# Patient Record
Sex: Female | Born: 1935
Health system: Southern US, Community
[De-identification: ages and names within clinical notes are randomized; demographics above are authoritative.]

## PROBLEM LIST (undated history)

## (undated) DIAGNOSIS — C50919 Malignant neoplasm of unspecified site of unspecified female breast: Secondary | ICD-10-CM

## (undated) DIAGNOSIS — C3490 Malignant neoplasm of unspecified part of unspecified bronchus or lung: Secondary | ICD-10-CM

## (undated) DIAGNOSIS — C50912 Malignant neoplasm of unspecified site of left female breast: Secondary | ICD-10-CM

## (undated) DIAGNOSIS — E039 Hypothyroidism, unspecified: Secondary | ICD-10-CM

## (undated) DIAGNOSIS — I350 Nonrheumatic aortic (valve) stenosis: Secondary | ICD-10-CM

## (undated) DIAGNOSIS — D709 Neutropenia, unspecified: Secondary | ICD-10-CM

## (undated) DIAGNOSIS — I480 Paroxysmal atrial fibrillation: Secondary | ICD-10-CM

## (undated) DIAGNOSIS — I714 Abdominal aortic aneurysm, without rupture, unspecified: Secondary | ICD-10-CM

## (undated) DIAGNOSIS — Z923 Personal history of irradiation: Secondary | ICD-10-CM

## (undated) DIAGNOSIS — I499 Cardiac arrhythmia, unspecified: Secondary | ICD-10-CM

## (undated) DIAGNOSIS — I272 Pulmonary hypertension, unspecified: Secondary | ICD-10-CM

## (undated) DIAGNOSIS — I10 Essential (primary) hypertension: Secondary | ICD-10-CM

## (undated) DIAGNOSIS — I340 Nonrheumatic mitral (valve) insufficiency: Secondary | ICD-10-CM

## (undated) DIAGNOSIS — C3492 Malignant neoplasm of unspecified part of left bronchus or lung: Secondary | ICD-10-CM

## (undated) DIAGNOSIS — I219 Acute myocardial infarction, unspecified: Secondary | ICD-10-CM

## (undated) DIAGNOSIS — M1990 Unspecified osteoarthritis, unspecified site: Secondary | ICD-10-CM

## (undated) DIAGNOSIS — J45909 Unspecified asthma, uncomplicated: Secondary | ICD-10-CM

## (undated) HISTORY — DX: Nonrheumatic mitral (valve) insufficiency: I34.0

## (undated) HISTORY — PX: BREAST BIOPSY: SHX20

## (undated) HISTORY — DX: Hypothyroidism, unspecified: E03.9

## (undated) HISTORY — DX: Malignant neoplasm of unspecified part of unspecified bronchus or lung: C34.90

## (undated) HISTORY — DX: Neutropenia, unspecified: D70.9

## (undated) HISTORY — DX: Essential (primary) hypertension: I10

## (undated) HISTORY — DX: Malignant neoplasm of unspecified site of unspecified female breast: C50.919

## (undated) HISTORY — DX: Nonrheumatic aortic (valve) stenosis: I35.0

## (undated) HISTORY — DX: Malignant neoplasm of unspecified part of left bronchus or lung: C34.92

## (undated) HISTORY — DX: Abdominal aortic aneurysm, without rupture, unspecified: I71.40

## (undated) HISTORY — PX: LUNG REMOVAL, PARTIAL: SHX233

## (undated) HISTORY — PX: FOOT SURGERY: SHX648

## (undated) HISTORY — DX: Malignant neoplasm of unspecified site of left female breast: C50.912

## (undated) HISTORY — DX: Pulmonary hypertension, unspecified: I27.20

## (undated) HISTORY — PX: BREAST LUMPECTOMY: SHX2

---

## 1997-11-02 DIAGNOSIS — C50912 Malignant neoplasm of unspecified site of left female breast: Secondary | ICD-10-CM

## 1997-11-02 HISTORY — DX: Malignant neoplasm of unspecified site of left female breast: C50.912

## 1998-08-05 ENCOUNTER — Encounter: Admission: RE | Admit: 1998-08-05 | Discharge: 1998-11-03 | Payer: Self-pay | Admitting: *Deleted

## 2001-06-20 ENCOUNTER — Ambulatory Visit (HOSPITAL_COMMUNITY): Admission: RE | Admit: 2001-06-20 | Discharge: 2001-06-20 | Payer: Self-pay | Admitting: Oncology

## 2001-06-20 ENCOUNTER — Encounter (HOSPITAL_COMMUNITY): Payer: Self-pay | Admitting: Oncology

## 2001-10-04 ENCOUNTER — Other Ambulatory Visit: Admission: RE | Admit: 2001-10-04 | Discharge: 2001-10-04 | Payer: Self-pay | Admitting: Obstetrics and Gynecology

## 2002-01-18 ENCOUNTER — Encounter (HOSPITAL_COMMUNITY): Admission: RE | Admit: 2002-01-18 | Discharge: 2002-02-17 | Payer: Self-pay | Admitting: Oncology

## 2002-01-18 ENCOUNTER — Encounter: Admission: RE | Admit: 2002-01-18 | Discharge: 2002-01-18 | Payer: Self-pay | Admitting: Oncology

## 2002-06-21 ENCOUNTER — Encounter (HOSPITAL_COMMUNITY): Payer: Self-pay | Admitting: Oncology

## 2002-06-21 ENCOUNTER — Ambulatory Visit (HOSPITAL_COMMUNITY): Admission: RE | Admit: 2002-06-21 | Discharge: 2002-06-21 | Payer: Self-pay | Admitting: Oncology

## 2003-01-19 ENCOUNTER — Encounter (HOSPITAL_COMMUNITY): Admission: RE | Admit: 2003-01-19 | Discharge: 2003-02-18 | Payer: Self-pay | Admitting: Oncology

## 2003-01-19 ENCOUNTER — Encounter: Admission: RE | Admit: 2003-01-19 | Discharge: 2003-01-19 | Payer: Self-pay | Admitting: Oncology

## 2003-03-08 ENCOUNTER — Ambulatory Visit (HOSPITAL_COMMUNITY): Admission: RE | Admit: 2003-03-08 | Discharge: 2003-03-08 | Payer: Self-pay | Admitting: Internal Medicine

## 2003-03-08 HISTORY — PX: COLONOSCOPY: SHX174

## 2003-05-23 ENCOUNTER — Encounter: Payer: Self-pay | Admitting: Pediatrics

## 2003-05-23 ENCOUNTER — Ambulatory Visit (HOSPITAL_COMMUNITY): Admission: RE | Admit: 2003-05-23 | Discharge: 2003-05-23 | Payer: Self-pay | Admitting: Pediatrics

## 2003-06-28 ENCOUNTER — Encounter: Payer: Self-pay | Admitting: Pediatrics

## 2003-06-28 ENCOUNTER — Ambulatory Visit (HOSPITAL_COMMUNITY): Admission: RE | Admit: 2003-06-28 | Discharge: 2003-06-28 | Payer: Self-pay | Admitting: Pediatrics

## 2003-07-11 ENCOUNTER — Ambulatory Visit (HOSPITAL_COMMUNITY): Admission: RE | Admit: 2003-07-11 | Discharge: 2003-07-11 | Payer: Self-pay | Admitting: Pediatrics

## 2003-07-11 ENCOUNTER — Encounter: Payer: Self-pay | Admitting: Pediatrics

## 2004-01-18 ENCOUNTER — Encounter: Admission: RE | Admit: 2004-01-18 | Discharge: 2004-01-18 | Payer: Self-pay | Admitting: Oncology

## 2004-02-26 ENCOUNTER — Ambulatory Visit (HOSPITAL_COMMUNITY): Admission: RE | Admit: 2004-02-26 | Discharge: 2004-02-26 | Payer: Self-pay | Admitting: Pediatrics

## 2004-02-27 ENCOUNTER — Ambulatory Visit (HOSPITAL_COMMUNITY): Admission: RE | Admit: 2004-02-27 | Discharge: 2004-02-27 | Payer: Self-pay | Admitting: Family Medicine

## 2004-03-13 ENCOUNTER — Ambulatory Visit (HOSPITAL_COMMUNITY): Admission: RE | Admit: 2004-03-13 | Discharge: 2004-03-13 | Payer: Self-pay | Admitting: Thoracic Surgery

## 2004-05-07 ENCOUNTER — Ambulatory Visit (HOSPITAL_COMMUNITY): Admission: RE | Admit: 2004-05-07 | Discharge: 2004-05-07 | Payer: Self-pay | Admitting: Thoracic Surgery

## 2004-07-23 ENCOUNTER — Ambulatory Visit (HOSPITAL_COMMUNITY): Admission: RE | Admit: 2004-07-23 | Discharge: 2004-07-23 | Payer: Self-pay | Admitting: Pediatrics

## 2004-08-06 ENCOUNTER — Ambulatory Visit (HOSPITAL_COMMUNITY): Admission: RE | Admit: 2004-08-06 | Discharge: 2004-08-06 | Payer: Self-pay | Admitting: Thoracic Surgery

## 2004-11-02 DIAGNOSIS — C3492 Malignant neoplasm of unspecified part of left bronchus or lung: Secondary | ICD-10-CM

## 2004-11-02 HISTORY — DX: Malignant neoplasm of unspecified part of left bronchus or lung: C34.92

## 2004-11-07 ENCOUNTER — Ambulatory Visit (HOSPITAL_COMMUNITY): Admission: RE | Admit: 2004-11-07 | Discharge: 2004-11-07 | Payer: Self-pay | Admitting: Thoracic Surgery

## 2005-01-16 ENCOUNTER — Ambulatory Visit (HOSPITAL_COMMUNITY): Payer: Self-pay | Admitting: Oncology

## 2005-01-16 ENCOUNTER — Encounter: Admission: RE | Admit: 2005-01-16 | Discharge: 2005-01-16 | Payer: Self-pay | Admitting: Oncology

## 2005-05-08 ENCOUNTER — Ambulatory Visit (HOSPITAL_COMMUNITY): Admission: RE | Admit: 2005-05-08 | Discharge: 2005-05-08 | Payer: Self-pay | Admitting: Thoracic Surgery

## 2005-05-26 ENCOUNTER — Ambulatory Visit (HOSPITAL_COMMUNITY): Admission: RE | Admit: 2005-05-26 | Discharge: 2005-05-26 | Payer: Self-pay | Admitting: Thoracic Surgery

## 2005-06-01 ENCOUNTER — Encounter (INDEPENDENT_AMBULATORY_CARE_PROVIDER_SITE_OTHER): Payer: Self-pay | Admitting: Specialist

## 2005-06-01 ENCOUNTER — Inpatient Hospital Stay (HOSPITAL_COMMUNITY): Admission: RE | Admit: 2005-06-01 | Discharge: 2005-06-06 | Payer: Self-pay | Admitting: Thoracic Surgery

## 2005-06-02 ENCOUNTER — Ambulatory Visit: Payer: Self-pay | Admitting: Internal Medicine

## 2005-06-17 ENCOUNTER — Encounter: Admission: RE | Admit: 2005-06-17 | Discharge: 2005-06-17 | Payer: Self-pay | Admitting: Thoracic Surgery

## 2005-07-15 ENCOUNTER — Encounter: Admission: RE | Admit: 2005-07-15 | Discharge: 2005-07-15 | Payer: Self-pay | Admitting: Thoracic Surgery

## 2005-08-12 ENCOUNTER — Ambulatory Visit (HOSPITAL_COMMUNITY): Admission: RE | Admit: 2005-08-12 | Discharge: 2005-08-12 | Payer: Self-pay | Admitting: Pediatrics

## 2005-11-18 ENCOUNTER — Encounter: Admission: RE | Admit: 2005-11-18 | Discharge: 2005-11-18 | Payer: Self-pay | Admitting: Thoracic Surgery

## 2006-01-15 ENCOUNTER — Encounter (HOSPITAL_COMMUNITY): Admission: RE | Admit: 2006-01-15 | Discharge: 2006-02-14 | Payer: Self-pay | Admitting: Oncology

## 2006-01-15 ENCOUNTER — Encounter: Admission: RE | Admit: 2006-01-15 | Discharge: 2006-01-15 | Payer: Self-pay | Admitting: Oncology

## 2006-01-15 ENCOUNTER — Ambulatory Visit (HOSPITAL_COMMUNITY): Payer: Self-pay | Admitting: Oncology

## 2006-03-03 ENCOUNTER — Encounter (HOSPITAL_COMMUNITY): Admission: RE | Admit: 2006-03-03 | Discharge: 2006-04-02 | Payer: Self-pay | Admitting: Oncology

## 2006-03-03 ENCOUNTER — Encounter: Admission: RE | Admit: 2006-03-03 | Discharge: 2006-03-03 | Payer: Self-pay | Admitting: Oncology

## 2006-07-16 ENCOUNTER — Ambulatory Visit (HOSPITAL_COMMUNITY): Payer: Self-pay | Admitting: Oncology

## 2006-07-16 ENCOUNTER — Encounter: Admission: RE | Admit: 2006-07-16 | Discharge: 2006-07-30 | Payer: Self-pay | Admitting: Oncology

## 2006-07-16 ENCOUNTER — Encounter (HOSPITAL_COMMUNITY): Admission: RE | Admit: 2006-07-16 | Discharge: 2006-07-30 | Payer: Self-pay | Admitting: Oncology

## 2006-08-16 ENCOUNTER — Ambulatory Visit (HOSPITAL_COMMUNITY): Admission: RE | Admit: 2006-08-16 | Discharge: 2006-08-16 | Payer: Self-pay | Admitting: Pediatrics

## 2006-08-17 ENCOUNTER — Encounter: Admission: RE | Admit: 2006-08-17 | Discharge: 2006-08-17 | Payer: Self-pay | Admitting: Oncology

## 2006-09-15 ENCOUNTER — Encounter: Admission: RE | Admit: 2006-09-15 | Discharge: 2006-09-15 | Payer: Self-pay | Admitting: Thoracic Surgery

## 2006-10-06 ENCOUNTER — Ambulatory Visit (HOSPITAL_COMMUNITY): Admission: RE | Admit: 2006-10-06 | Discharge: 2006-10-06 | Payer: Self-pay | Admitting: Ophthalmology

## 2006-10-11 ENCOUNTER — Encounter (HOSPITAL_COMMUNITY): Admission: RE | Admit: 2006-10-11 | Discharge: 2006-11-10 | Payer: Self-pay | Admitting: Oncology

## 2007-03-09 ENCOUNTER — Ambulatory Visit: Payer: Self-pay | Admitting: Thoracic Surgery

## 2007-03-09 ENCOUNTER — Encounter: Admission: RE | Admit: 2007-03-09 | Discharge: 2007-03-09 | Payer: Self-pay | Admitting: Thoracic Surgery

## 2007-03-15 ENCOUNTER — Ambulatory Visit (HOSPITAL_COMMUNITY): Payer: Self-pay | Admitting: Oncology

## 2007-03-15 ENCOUNTER — Encounter (HOSPITAL_COMMUNITY): Admission: RE | Admit: 2007-03-15 | Discharge: 2007-04-14 | Payer: Self-pay | Admitting: Oncology

## 2007-08-18 ENCOUNTER — Ambulatory Visit (HOSPITAL_COMMUNITY): Admission: RE | Admit: 2007-08-18 | Discharge: 2007-08-18 | Payer: Self-pay | Admitting: Pediatrics

## 2007-09-28 ENCOUNTER — Ambulatory Visit (HOSPITAL_COMMUNITY): Payer: Self-pay | Admitting: Oncology

## 2007-09-28 ENCOUNTER — Encounter (HOSPITAL_COMMUNITY): Admission: RE | Admit: 2007-09-28 | Discharge: 2007-10-28 | Payer: Self-pay | Admitting: Oncology

## 2007-11-16 ENCOUNTER — Other Ambulatory Visit: Admission: RE | Admit: 2007-11-16 | Discharge: 2007-11-16 | Payer: Self-pay | Admitting: Obstetrics and Gynecology

## 2008-04-02 ENCOUNTER — Encounter (HOSPITAL_COMMUNITY): Admission: RE | Admit: 2008-04-02 | Discharge: 2008-05-02 | Payer: Self-pay | Admitting: Oncology

## 2008-04-02 ENCOUNTER — Ambulatory Visit (HOSPITAL_COMMUNITY): Payer: Self-pay | Admitting: Oncology

## 2008-05-02 ENCOUNTER — Ambulatory Visit: Payer: Self-pay | Admitting: Thoracic Surgery

## 2008-08-28 ENCOUNTER — Encounter (HOSPITAL_COMMUNITY): Admission: RE | Admit: 2008-08-28 | Discharge: 2008-09-27 | Payer: Self-pay | Admitting: Oncology

## 2008-08-28 ENCOUNTER — Ambulatory Visit (HOSPITAL_COMMUNITY): Payer: Self-pay | Admitting: Oncology

## 2008-08-28 ENCOUNTER — Ambulatory Visit: Payer: Self-pay | Admitting: Internal Medicine

## 2008-08-30 ENCOUNTER — Ambulatory Visit (HOSPITAL_COMMUNITY): Admission: RE | Admit: 2008-08-30 | Discharge: 2008-08-30 | Payer: Self-pay | Admitting: Pediatrics

## 2008-09-12 ENCOUNTER — Ambulatory Visit (HOSPITAL_COMMUNITY): Admission: RE | Admit: 2008-09-12 | Discharge: 2008-09-12 | Payer: Self-pay | Admitting: Internal Medicine

## 2008-09-12 ENCOUNTER — Ambulatory Visit: Payer: Self-pay | Admitting: Internal Medicine

## 2008-09-12 ENCOUNTER — Encounter: Payer: Self-pay | Admitting: Internal Medicine

## 2008-09-12 HISTORY — PX: COLONOSCOPY: SHX174

## 2008-10-01 ENCOUNTER — Encounter (HOSPITAL_COMMUNITY): Admission: RE | Admit: 2008-10-01 | Discharge: 2008-10-31 | Payer: Self-pay | Admitting: Oncology

## 2008-10-15 ENCOUNTER — Ambulatory Visit (HOSPITAL_COMMUNITY): Payer: Self-pay | Admitting: Oncology

## 2008-11-07 ENCOUNTER — Ambulatory Visit: Payer: Self-pay | Admitting: Thoracic Surgery

## 2009-04-15 ENCOUNTER — Encounter (HOSPITAL_COMMUNITY): Admission: RE | Admit: 2009-04-15 | Discharge: 2009-05-15 | Payer: Self-pay | Admitting: Oncology

## 2009-04-16 ENCOUNTER — Ambulatory Visit (HOSPITAL_COMMUNITY): Payer: Self-pay | Admitting: Oncology

## 2009-08-29 ENCOUNTER — Ambulatory Visit (HOSPITAL_COMMUNITY): Admission: RE | Admit: 2009-08-29 | Discharge: 2009-08-29 | Payer: Self-pay | Admitting: Ophthalmology

## 2009-09-02 ENCOUNTER — Ambulatory Visit (HOSPITAL_COMMUNITY): Admission: RE | Admit: 2009-09-02 | Discharge: 2009-09-02 | Payer: Self-pay | Admitting: Pediatrics

## 2009-10-16 ENCOUNTER — Ambulatory Visit (HOSPITAL_COMMUNITY): Payer: Self-pay | Admitting: Oncology

## 2009-10-16 ENCOUNTER — Encounter (HOSPITAL_COMMUNITY): Admission: RE | Admit: 2009-10-16 | Discharge: 2009-10-30 | Payer: Self-pay | Admitting: Oncology

## 2010-05-14 ENCOUNTER — Encounter (HOSPITAL_COMMUNITY): Admission: RE | Admit: 2010-05-14 | Discharge: 2010-06-13 | Payer: Self-pay | Admitting: Oncology

## 2010-05-14 ENCOUNTER — Ambulatory Visit (HOSPITAL_COMMUNITY): Payer: Self-pay | Admitting: Oncology

## 2010-09-08 ENCOUNTER — Ambulatory Visit (HOSPITAL_COMMUNITY): Admission: RE | Admit: 2010-09-08 | Discharge: 2010-09-08 | Payer: Self-pay | Admitting: Pediatrics

## 2010-11-23 ENCOUNTER — Encounter: Payer: Self-pay | Admitting: Pediatrics

## 2010-11-23 ENCOUNTER — Encounter: Payer: Self-pay | Admitting: Thoracic Surgery

## 2010-11-23 ENCOUNTER — Encounter (HOSPITAL_COMMUNITY): Payer: Self-pay | Admitting: Oncology

## 2011-01-18 LAB — COMPREHENSIVE METABOLIC PANEL
AST: 21 U/L (ref 0–37)
Alkaline Phosphatase: 65 U/L (ref 39–117)
CO2: 29 mEq/L (ref 19–32)
Chloride: 101 mEq/L (ref 96–112)
Creatinine, Ser: 0.95 mg/dL (ref 0.4–1.2)
Glucose, Bld: 95 mg/dL (ref 70–99)
Potassium: 4.3 mEq/L (ref 3.5–5.1)
Total Bilirubin: 0.7 mg/dL (ref 0.3–1.2)
Total Protein: 7.4 g/dL (ref 6.0–8.3)

## 2011-01-18 LAB — CBC
Hemoglobin: 12.9 g/dL (ref 12.0–15.0)
RDW: 14.7 % (ref 11.5–15.5)

## 2011-02-03 LAB — COMPREHENSIVE METABOLIC PANEL
AST: 18 U/L (ref 0–37)
BUN: 13 mg/dL (ref 6–23)
CO2: 28 mEq/L (ref 19–32)
Chloride: 105 mEq/L (ref 96–112)
GFR calc non Af Amer: 58 mL/min — ABNORMAL LOW (ref >60)
Potassium: 3.7 mEq/L (ref 3.5–5.1)

## 2011-02-03 LAB — CBC
HCT: 37.2 % (ref 36.0–46.0)
Platelets: 250 10*3/uL (ref 150–400)
RDW: 14.6 % (ref 11.5–15.5)
WBC: 5.8 10*3/uL (ref 4.0–10.5)

## 2011-02-03 LAB — DIFFERENTIAL
Basophils Absolute: 0 10*3/uL (ref 0.0–0.1)
Lymphs Abs: 2.6 10*3/uL (ref 0.7–4.0)
Monocytes Relative: 8 % (ref 3–12)
Neutro Abs: 2.5 10*3/uL (ref 1.7–7.7)
Neutrophils Relative %: 44 % (ref 43–77)

## 2011-02-05 LAB — BASIC METABOLIC PANEL
BUN: 8 mg/dL (ref 6–23)
CO2: 29 mEq/L (ref 19–32)
Calcium: 9.3 mg/dL (ref 8.4–10.5)
Chloride: 103 mEq/L (ref 96–112)
GFR calc non Af Amer: >60 mL/min (ref >60)
Potassium: 4.2 mEq/L (ref 3.5–5.1)
Sodium: 137 mEq/L (ref 135–145)

## 2011-02-05 LAB — HEMOGLOBIN AND HEMATOCRIT, BLOOD: Hemoglobin: 12.8 g/dL (ref 12.0–15.0)

## 2011-03-17 NOTE — Op Note (Signed)
NAME:  Lisa [name], Lisa [name]              ACCOUNT NO.:  [id number]   MEDICAL RECORD NO.:  [id number]          PATIENT TYPE:  AMB   LOCATION:  DAY                           FACILITY:  [facility]   PHYSICIAN:  R. Garfield Cornea, M.D. DATE OF BIRTH:  Nov 02, 1934   DATE OF PROCEDURE:  09/12/2008  DATE OF DISCHARGE:                               OPERATIVE REPORT   PROCEDURE:  Colonoscopy with biopsy, polyp ablation.   INDICATIONS FOR PROCEDURE:  A 77 year old lady with history of colonic  polyps, removed.  Colonoscopy 5 years ago.  She is here for  surveillance.  Risks, benefits and alternatives have been reviewed.  At  least one of these polyps was an adenoma upon review of the hospital  records.  She has a personal history of breast carcinoma and lung  carcinoma.  Colonoscopy now being done as surveillance maneuver.  The  risks, benefits, alternatives, and limitations have been reviewed.  Questions answered.  Please see documentation in medical record.   PROCEDURE NOTE:  O2 saturation, blood pressure, pulse, and respirations  monitored throughout the entire procedure.   CONSCIOUS SEDATION:  Versed 3 mg IV, Demerol 75 mg IV in divided doses.   INSTRUMENT:  Pentax video chip system.   FINDINGS:  Digital rectal exam revealed no abnormalities except the  findings.  The prep was adequate.  Colon:  Colonic mucosa was surveyed  from the rectosigmoid junction through the left transverse right colon  to appendiceal orifice, ileocecal valve, and cecum.  These structures  were well seen and photographed for the record.  From this level, the  scope was slowly withdrawn.  All previously mentioned mucosal surfaces  were again seen.  The patient did have scattered left-sided diverticula  in the rectosigmoid junction or multiple diminutive polyps, one was  biopsied, the remainder were ablated with the tip of the hot snare  cautery catheter.  The scope was then pulled down the rectum where  thorough examination  of rectal mucosa including retroflex view of the  anal verge demonstrated no mucosal abnormalities.  The patient tolerated  the procedure well and was reactive to endoscopy.   IMPRESSION:  Normal rectum and distal sigmoid diminutive polyps, one  biopsied.  The remainder ablated as described above, scattered left-  sided diverticulum and colonic mucosa appeared normal.   RECOMMENDATIONS:  1. Diverticulosis.  Polyp literature provided to Ms. Mckone.  2. Follow up on path.  3. Further recommendations to follow.      Bridgette Habermann, M.D.  Electronically Signed     RMR/MEDQ  D:  09/12/2008  T:  09/12/2008  Job:  [id number]   cc:   Micah Flesher. Cleta Alberts DO, [place]  Fax: [phone]

## 2011-03-17 NOTE — Assessment & Plan Note (Signed)
OFFICE VISIT   [name], Lisa V  DOB:  10/04/1934                                        May 02, 2008  CHART #:  [id number]   The patient came for followup today and is doing well.  Her CT scan  showed no evidence of recurrence of tumor done 04/02/2008.   See her again in 6 months with a chest x-ray.  She is now 3 years since  her surgery.  Her blood pressure was 151/84, pulse 79, respirations 18,  and sats were 93.   Nicanor Alcon, M.D.  Electronically Signed   DPB/MEDQ  D:  05/02/2008  T:  05/02/2008  Job:  [id number]

## 2011-03-17 NOTE — Consult Note (Signed)
NAME:  Lisa [name], Lisa [name]              ACCOUNT NO.:  [id number]   MEDICAL RECORD NO.:  [id number]          PATIENT TYPE:  AMB   LOCATION:  DAY                           FACILITY:  [facility]   PHYSICIAN:  R. Garfield Cornea, M.D. DATE OF BIRTH:  May 05, 1934   DATE OF CONSULTATION:  08/28/2008  DATE OF DISCHARGE:                                 CONSULTATION   REASON FOR CONSULTATION:  History of polyps time for 5-year colonoscopy  followup.   PHYSICIAN CONSULTING NOTE:  Bridgette Habermann, MD   PHYSICIAN REQUESTING CONSULTATION:  Micah Flesher. Halm, DO, FAAP   HISTORY OF PRESENT ILLNESS:  Lisa [name] is a pleasant 77 year old  African American female with the history of colonic polyps who presents  to schedule her surveillance 5 year colonoscopy.  She had a colonoscopy  back in 2004, by Dr. Gala Romney.  She had 0.5 and a 0.75 cm size hepatic  flexure and splenic flexure polyp, she had diminutive rectal polyps,  left-sided diverticula.  The path is not available to me at this time.  Her old records are currently have not been located.  We are trying to  recreate the pathology from hospital records.   The patient states that she has been doing well.  She has had no  problems with her bowel movements.  She denies any blood in the stool,  melena, nausea, vomiting, heartburn, dysphagia, odynophagia, anorexia,  or weight loss.   CURRENT MEDICATIONS:  1. Levothyroxine 100 mcg daily.  2. Micardis HCT 80/12.5 mg daily.  3. Aspirin 81 mg daily.  4. Metanx p.r.n.  5. Darvocet-N 100 p.r.n.  6. Detrol 4 mg p.r.n.  7. Diclofenac 75 mg p.r.n., but does not take daily.   ALLERGIES:  No known drug allergies.   PAST MEDICAL HISTORY:  Hypertension, history of left breast carcinoma  stage I treated in 1999.  She also had lung carcinoma with resection in  September 2006, the left lung treated by Dr. Arlyce Dice.  History of  arthritis and hypothyroidism.   FAMILY HISTORY:  Mother had heart and kidney failure.  Sister  died with  blood cancer.  No family history of colorectal cancer.   SOCIAL HISTORY:  She has been widowed since January 2009, was married  for 55 years.  She has 3 children.  She is retired.  She quit smoking 35  years ago.  No alcohol use.   REVIEW OF SYSTEMS:  See HPI for GI.  CONSTITUTIONAL:  No weight loss.  CARDIOPULMONARY:  No chest pain, shortness of breath, palpitations, or  cough.  GENITOURINARY:  No dysuria or hematuria.   PHYSICAL EXAMINATION:  VITAL SIGNS:  Weight 212, height 5 feet 6 inches,  temp 97.7, blood pressure 122/62, pulse 64.  GENERAL:  Pleasant, well-nourished, well-developed black female in no  acute distress.  SKIN:  Warm and dry.  No jaundice.  HEENT:  Sclera nonicteric.  Oropharyngeal mucosa moist and pink.  No  lesion, erythema, or exudates.  No lymphadenopathy or thyromegaly.  CHEST:  Lungs are clear to auscultation.  CARDIAC:  Regular rate and rhythm.  Normal S1  and S2.  No murmurs, rubs,  or gallops.  ABDOMEN:  Positive bowel sounds.  Abdomen is soft, nontender, and  nondistended.  No organomegaly or masses.  No rebound or guarding.  No  abdominal bruits or hernia.  LOWER EXTREMITIES:  No edema.   IMPRESSION:  Lisa [name] is a 77 year old with history of colonic polyps  with 5-year followup colonoscopy recommended.  Assuming the pathology,  it must have been tubular adenoma at least.  We are working on  retrieving a copy of the pathology.  She also has a personal history of  breast carcinoma and primary lung cancer.   PLAN:  Colonoscopy with Dr. Gala Romney in the near future.  We will hold her  aspirin 4 days prior to procedure.   I would like to thank Dr. Cleta Alberts for allowing [place] to take part in the care  of this patient.      Neil Crouch, P.ABridgette Habermann, M.D.  Electronically Signed    LL/MEDQ  D:  08/28/2008  T:  08/29/2008  Job:  [id number]   cc:   Micah Flesher. Cleta Alberts DO, [place]  Fax: [phone]

## 2011-03-17 NOTE — Letter (Signed)
November 07, 2008   Lisa [name]. Tressie Stalker, MD  [address]  [place], [place] [address]   Re:  [name], Lisa V              DOB:  [birth year]   Dear Randall Hiss,   I saw the patient back.  Now, she states her last CT scan showed no  evidence of recurrence for cancer.  Blood pressure is 135/78, pulse 80,  respirations 18, and sats are 95%.  She is doing well overall.  I will  see her again.  She is doing well overall it has been 4-1/2 years since  her surgery.  We will refer her back to you for long-term followup.   Nicanor Alcon, M.D.  Electronically Signed   DPB/MEDQ  D:  11/07/2008  T:  11/07/2008  Job:  [id number]

## 2011-03-20 NOTE — Discharge Summary (Signed)
NAME:  Lisa [name], Lisa [name]              ACCOUNT NO.:  [id number]   MEDICAL RECORD NO.:  [id number]          PATIENT TYPE:  INP   LOCATION:  2017                         FACILITY:  [facility]   PHYSICIAN:  Nicanor Alcon, M.D. DATE OF BIRTH:  [birth year]   DATE OF ADMISSION:  06/01/2005  DATE OF DISCHARGE:  06/06/2005                                 DISCHARGE SUMMARY   DIAGNOSIS:  Left upper lobe lung mass.   PAST MEDICAL HISTORY AND DISCHARGE DIAGNOSES:  1.  History of breast cancer status post resection on the left with      radiation therapy by Dr. Elba Barman, followed by Dr. Tressie Stalker.  2.  Hypertension.  3.  Urinary incontinence.  4.  Hypothyroidism.  5.  Osteoarthritis.  6.  History of tobacco abuse.  7.  Hiatal hernia.  8.  Left upper lobe mass status post left mini thoracotomy and left upper      lobe wedge resection.  Pathology revealed stage1A, T1, N0, MX non-small-      cell lung cancer.  9.  Status post bilateral bunionectomy.   ALLERGIES:  No known drug allergies.   BRIEF HISTORY:  The patient is a 77 year old African-American female with a  history of breast cancer diagnosed in 2000 status post left lumpectomy and  radiation therapy under Dr. Elba Barman.  The patient is currently followed by Dr.  Tressie Stalker.  She presented to Dr. Arlyce Dice with a left upper lobe mass;  however, was not found to have significant uptake from PET scan.  Routine CT  performed in early July showed interval increase in the size of the left  upper lobe mass.  Therefore, it was Dr. Lorelei Pont opinion that the patient  should proceed with wedge resection.   HOSPITAL COURSE:  The patient was admitted and taken to the OR on June 01, 2005, for left video-assisted thoracic surgery, left upper lobe wedge  resection, left mini thoracotomy, and lymph node dissection.  Initial  pathology revealed adenocarcinoma, probable bronchioalveolar type.  The  patient tolerated the procedure well, and she was hemodynamically stable  in  the immediate postoperative period.  The patient was transferred from the OR  to Postanesthesia care unit in stable condition.  The patient was extubated  without complication and woke up from anesthesia neurologically intact.   The patient's postoperative course has progressed as expected.  On  postoperative day #1, she was afebrile with stable vital signs.  Her chest x-  ray was stable and, therefore, her posterior tube was discontinued in a  routine fashion.  She was ambulated and has continued to tolerate this well  throughout the postoperative course.   Final pathology revealed stage 1A, T1, N0, MX non-small-cell lung cancer.  Dr. Julien Nordmann, of the hematology/oncology service, consulted on June 02, 2005.  It was his opinion there was no survival benefit from adjuvant  chemotherapy for this cancer; however, the patient would need close followup  as an outpatient. Dr. Julien Nordmann was going to arrange followup appointment for  the patient with dr. Tressie Stalker in [place] as she is established with him.  The patient's anterior chest tube was discontinued on June 02, 2005.  Host  removal chest x-ray revealed a left pneumothorax approximately 10 to 15%.  There was no mediastinal shift.  The patient remained in stable condition,  and this was monitored.  Followup chest x-rays revealed that the  pneumothorax remained stable without change.   On postoperative day #4, the patient is afebrile and without complaint.  The  wounds are healing well, and her chest x-ray is stable at this time.  Brain  CT was okay without evidence of metastasis.  The patient is in stable  condition at this time.  As long as she continues to progress in the current  manner, will be ready for discharge in the next one to two days pending  morning round reevaluation.   LABORATORY DATA:  CBC on June 05, 2005: Hemoglobin 11, hematocrit 32.2,  white count 6.3, platelets 260. BMP on June 05, 2005, sodium 140,  potassium  3.7, BUN 8, creatinine 0.7, glucose 92.   CONDITION ON DISCHARGE:  Stable.   DISCHARGE INSTRUCTIONS:  1.  Medications:  Tylox 1 to 2 q.4-6h. p.r.n. pain.  Next, diclofenac 75 mg p.o. b.i.d.  Next, levothyroxine 100 mcg daily,  Next, aspirin 325 mg daily,  Next, Detrol 4 mg daily.   WOUND CARE:  The patient may shower daily and clean incisions with soap and  water.  For rash, she should contact CVTS office at [phone].   DIET:  Low-salt, low-fat.   ACTIVITY:  The patient should refrain from driving for two weeks and no  heavy lifting for three weeks.  She should continue daily breathing and  walking exorcizes.   FOLLOW UP APPOINTMENTS:  [facility] for PA and lateral chest x-ray  on June 17, 2005, at 2:40 p.m.  Next, Dr. Arlyce Dice June 17, 2005, at 3:40 p.m.  Next with Dr. Tressie Stalker.  This appointment will be arranged by Dr. Julien Nordmann, and the patient will be  contacted with the date and the time.      [place]   AY/MEDQ  D:  06/05/2005  T:  06/06/2005  Job:  [id number]   cc:   Eilleen Kempf, MD  Fax: [phone]   Gaston Islam. Neijstrom, MD  [address]  [place] [address]  Fax: [phone]

## 2011-03-20 NOTE — Op Note (Signed)
NAME:  [name], Lisa              ACCOUNT NO.:  [id number]   MEDICAL RECORD NO.:  [id number]          PATIENT TYPE:  INP   LOCATION:  2899                         FACILITY:  [facility]   PHYSICIAN:  Nicanor Alcon, M.D. DATE OF BIRTH:  Sep 19, 1934   DATE OF PROCEDURE:  DATE OF DISCHARGE:                                 OPERATIVE REPORT   PREOPERATIVE DIAGNOSIS:  Left upper lobe mass.   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   PROCEDURE:  Left mini-thoracotomy, wedged left upper lobe lesion with node  dissection.   SURGEON:  Nicanor Alcon, M.D.   FIRST ASSISTANT:  Leta Baptist, P.A.-C.   ANESTHESIA:  General.   DESCRIPTION OF PROCEDURE:  After percutaneous insertion of all monitoring  lines, the patient underwent general anesthesia.  She was turned to the left  lateral thoracotomy position.   A dual-lumen tube was inserted.  Two trocar sites were made in the anterior  and posterior axillary line at the seventh intercostal space.  Two trocars  were inserted.  A 0-degree scope was inserted.  The lesion was seen in the  apical posterior segment of the left upper lobe.  A 10-cm incision was made  over the fifth intercostal space.  The latissimus was partially divided.  The serratus was reflected anteriorly, and the fifth intercostal space was  entered.  A small retractor was inserted, with minimal spreading of the  ribs.  The lesion was identified in the apical posterior segment.  It was  about 2 to 3 cm in size.  It was then resected with the Echelon 60 stapler  with four applications.  Frozen section revealed adenocarcinoma with  bronchioalveolar features.  Margins were negative.  CoSeal was applied to  the staple line.  Two chest tubes were brought into the trocar sites and  tied in place with 0 silk.  A Marcaine block was done in the usual fashion.  The On-Q single catheter was placed in the usual fashion.  The chest  was  closed with four pericostal #1 Vicryl in the muscle layer, 2-0 Vicryl in the  subcutaneous tissue, and 3-0 as a subcuticular stitch.  Dermabond was  applied to the skin.   The patient was returned to the recovery room in stable condition.       DPB/MEDQ  D:  06/01/2005  T:  06/01/2005  Job:  [id number]

## 2011-03-20 NOTE — H&P (Signed)
NAME:  Lisa [name], Lisa [name]              ACCOUNT NO.:  [id number]   MEDICAL RECORD NO.:  [id number]          PATIENT TYPE:  INP   LOCATION:  NA                           FACILITY:  [facility]   PHYSICIAN:  Nicanor Alcon, M.D. DATE OF BIRTH:  [birth year]   DATE OF ADMISSION:  05/30/2005  DATE OF DISCHARGE:                                HISTORY & PHYSICAL   CHIEF COMPLAINT:  Left lung mass.   HISTORY OF PRESENT ILLNESS:  This 77 year old African-American female was  found to have left upper lobe mass.  She does give a history of having  breast cancer treated with lumpectomy and radiation several years ago.  This  mass was a 2.4 mass in the left upper lobe.  She quit smoking 15 years ago  and has had no hemoptysis but did smoke one pack a day for many years.  Denies any excessive sputum.  She does get shortness of breath with  exertion.  A PET scan was done which showed no increased uptake, and then  she was followed with serial scans over the process of a year, and the 2.4  mass has now increased to maximum transverse diameter of 3.2.  She is on no  medication.   FAMILY HISTORY:  Negative for vascular disease or cancer.   ALLERGIES:  None.   SOCIAL HISTORY:  She is married and has three children.  Does not smoke at  the present time.   REVIEW OF SYSTEMS:  She is 5 feet 11 inches, 211 pounds.  Pulmonary function  tests show an FVC of 1.86 or 64% of predicted, an FEV1 of 1.2 or 54% of  predicted.  CARDIAC:  No history of angina or atrial fibrillation.  PULMONARY:  No history of bronchitis or asthma.  GI: No history of nausea,  vomiting, constipation, or GERD.  VASCULAR: No history of claudication, DVT,  or TIAs.  ORTHOPEDIC:  She does complain of pain in her feet from lying flat  and complains of arthritis in major joints.  NEUROLOGIC:  No history of  seizures or blackouts.  HEMATOLOGIC:  No history of anemia.  EYES/ENT:  No  change in eyesight or hearing.   PHYSICAL EXAMINATION:   GENERAL:  Slightly obese African-American female in  no acute distress.  VITAL SIGNS:  Blood pressure 148/82, pulse 74, respirations 16, O2  saturation 84%.  HEAD:  Atraumatic.  EYES:  Pupils equal, round, and reactive to light.  Extraocular movements  normal.  EARS:  Tympanic membranes are intact.  NOSE:  No septal deviation.  THROAT:  Without lesion.  NECK:  Supple, no thyromegaly.  No supraclavicular adenopathy.  CHEST: Clear to auscultation and percussion.  HEART:  Regular sinus rhythm.  There is an incision in the left breast.  ABDOMEN:  Soft.  No hepatosplenomegaly.  Bowel sounds are normal.  EXTREMITIES:  Pulses are 2+.  There is no clubbing or edema.  NEUROLOGIC:  Oriented x 3.  Cranial nerves II-XII intact.  Sensory and motor  intact.  SKIN:  Without lesion.   IMPRESSION:  1.  Enlarging left upper lobe mass  with negative PET scan.  2.  History of breast cancer treated surgically and radiation.   PLAN:  Left VATS wedge resection of left upper lobe lesion, possible left  upper lobectomy.       DPB/MEDQ  D:  05/29/2005  T:  05/29/2005  Job:  [id number]

## 2011-03-20 NOTE — Op Note (Signed)
NAME:  Lisa [name], Lisa [name]                        ACCOUNT NO.:  [id number]   MEDICAL RECORD NO.:  [id number]                   PATIENT TYPE:  AMB   LOCATION:  DAY                                  FACILITY:  [facility]   PHYSICIAN:  R. Garfield Cornea, M.D.              DATE OF BIRTH:  Sep 18, 1934   DATE OF PROCEDURE:  03/08/2003  DATE OF DISCHARGE:                                 OPERATIVE REPORT   PROCEDURE:  Colonoscopy with snare polypectomy.   INDICATIONS FOR PROCEDURE:  The patient is a 77 year old lady sent over by  the courtesy of Dr. Theora Master for colorectal cancer screening.  She is  devoid of any lower GI tract symptoms.  She has no family history of  colorectal neoplasia.  She has never had her lower GI tract imaged.  Colonoscopy is now being done as a standard screening maneuver.  This  approach has been discussed with the patient at length.  The potential  risks, benefits, and alternatives have been reviewed and questions answered.  She is agreeable.  Please see my handwritten H&P for more information.   PROCEDURE:  O2 saturation, blood pressure, pulses, and respirations were  monitored throughout the entire procedure.  Conscious sedation was with  Versed 3 mg IV, Demerol 50 mg IV in divided doses.  The instrument used was  the Olympus video chip adult colonoscope.   FINDINGS:  Digital rectal examination revealed no abnormalities.   ENDOSCOPIC FINDINGS:  The prep was good.   Rectum:  Examination of the rectal mucosa including retroflex view of the  anal verge revealed multiple diminutive polyps which were destroyed with the  tip of the snare.   Colon:  The colonic mucosa was surveyed from the rectosigmoid junction  through the left, transverse, right colon to the area of the appendiceal  orifice, ileocecal valve, and cecum.  These structures were well-seen and  photographed for the record.  The patient was noted to have polyps 0.5 to  0.75 in size at the hepatic flexure and  splenic flexure at 35 cm.  The  patient was also noted to have left-sided diverticula.  From the level of  the cecum and ileocecal valve, the scope was slowly withdrawn.  All  previously mentioned mucosal surfaces were again seen.  No other  abnormalities were observed.  The polyps in the hepatic flexure and splenic  flexure at 35 cm were snared and recovered.  The patient tolerated the  procedure well and was reactive in endoscopy.   IMPRESSION:  1. Diminutive rectal polyps destroyed with the tip of the snare.  Otherwise     normal rectum.  2. Left-sided diverticula.  3. Polyps at hepatic flexure, splenic flexure at 35 cm, removed as described     above.   RECOMMENDATIONS:  1. No aspirin or arthritis medications for the next 10 days.  2.     Follow up on pathology.  3. Diverticulosis literature provided to the patient.  4. Further recommendations to follow.                                               Bridgette Habermann, M.D.    RMR/MEDQ  D:  03/08/2003  T:  03/08/2003  Job:  [id number]   cc:   Micah Flesher. Halm, D.O.  [address] [place] [address]  Fax: [phone]

## 2011-03-20 NOTE — Consult Note (Signed)
NAME:  Lisa [name], Lisa [name]              ACCOUNT NO.:  [id number]   MEDICAL RECORD NO.:  [id number]          PATIENT TYPE:  INP   LOCATION:  [id number]                         FACILITY:  [facility]   PHYSICIAN:  Eilleen Kempf, MD  DATE OF BIRTH:  1934/08/04   DATE OF CONSULTATION:  06/02/2005  DATE OF DISCHARGE:                                   CONSULTATION   REASON FOR CONSULTATION:  Lung cancer.   HISTORY OF PRESENT ILLNESS:  Lisa [name] is a pleasant 77 year old African  American female with a history of breast cancer diagnosed in the year 2000  status post left lumpectomy and XRT under the care of Dr. Elba Barman.  The patient  is currently followed by Dr. Tressie Stalker at [facility] in [place].  She presented with left upper lobe mass measuring 2.9 by 2.4 cm, but did not  have significant uptake on a PET examination.  Repeat CT on May 08, 2005,  showed interval increase in size in the left upper lobe mass.  On June 01, 2005, she underwent wedge resection of the left upper lobe lesion with no  dissection.  Pathology revealed 2.2 cm adenocarcinoma with bronchoalveolar  features consistent with lung primary.  Dissected lymph nodes are negative  for metastases.  She is stage 1AT1M0NX non-small cell lung carcinoma.   PAST MEDICAL HISTORY:  1.  Prior history of breast cancer status post resection on the left with      radiation by Dr. Elba Barman, followed by Dr. Tressie Stalker in [place].  2.  Hypertension.  3.  Urinary incontinence.  4.  Hypothyroidism.  5.  Osteoarthritis.  6.  History of remote tobacco abuse.  7.  Hiatal hernia.   PAST SURGICAL HISTORY:  Status post left upper lobe wedge resection with  node dissection June 01, 2005, Dr. Arlyce Dice.  Status post bilateral  bunionectomy.   ALLERGIES:  No known drug allergies.   CURRENT MEDICATIONS:  Aspirin 325 mg daily, Dulcolax daily, Zinacef 1.5  grams q.12h., Voltaren 75 mg b.i.d., Xopenex nebulizer b.i.d., Synthroid 100  mcg daily, morphine  sulfate PCA, Benadryl p.r.n., Narcan p.r.n., Zofran  p.r.n., Compazine p.r.n., Ultram p.r.n.   REVIEW OF SYMPTOMS:  Except for mild decrease in appetite and dyspnea on  exertion, the rest of the review of systems was essentially negative.   FAMILY HISTORY:  Mother alive with what she called GYN cancer, father alive  and well.  The rest of the family members are enjoying good health, no  family history of cancer.   SOCIAL HISTORY:  The patient is married, she has three children, retired.  She quit 19 years ago the use of tobacco.  She lives in [place], [place].  She is [employer].  No alcohol history.  Last mammogram July 23, 2004, negative for malignancy.   PHYSICAL EXAMINATION:  GENERAL:  This is a moderately obese 77 year old black female in no acute  distress, alert and oriented x 3.  VITAL SIGNS:  Stable, afebrile, weight 202 pounds, height 67 1/2 inches.  HEENT:  Normocephalic, atraumatic, PERRLA, oral mucosa without thrush or  lesions.  She wears dentures.  NECK:  Supple, no JVD, no cervical or supraclavicular masses.  LUNGS:  Minimal wheezing and rhonchi at the bases, no rales.  No axillary  masses.  HEART:  Regular rate and rhythm without murmurs, gallops, and rubs.  ABDOMEN:  Obese, nontender, bowel sounds x 4, no palpable spleen or liver.  GU AND RECTAL:  Deferred.  EXTREMITIES:  No cyanosis, clubbing, and edema.  SKIN:  Without lesions or petechiae.  NEUROLOGICAL:  Nonfocal.   LABORATORY DATA:  Hemoglobin 11.6,  hematocrit 34.5, white count 8,  platelets 253, neutrophil count not available, MCV 86.3.  PT 13.9, PTT 30,  INR 1.1.  Sodium 137, potassium 3.9, BUN 5, creatinine 0.9, glucose 123,  total bilirubin 0.8, alkaline phos 86, AST 21, ALT 16, total protein 7.6,  albumin 4.1, calcium 8.6.   ASSESSMENT:  Dr. Julien Nordmann has seen and evaluated the patient and the chart  has been reviewed.  There is no survival benefit for adjuvant chemotherapy  for stage  I-A non-small cell lung carcinoma.  The patient will need close  follow up, however.  We will arrange a follow up appointment for her with  Dr. Tressie Stalker at [facility], since the clinic is closest to her home.  We  will be happy to see her in the future if needed.   Thank you very much for allowing [place] to participate in Lisa [name] care.       SW/MEDQ  D:  06/03/2005  T:  06/03/2005  Job:  [id number]   cc:   Dr. Sharlyne Pacas

## 2011-04-23 ENCOUNTER — Encounter (HOSPITAL_COMMUNITY): Payer: Self-pay | Admitting: *Deleted

## 2011-05-18 ENCOUNTER — Ambulatory Visit (HOSPITAL_COMMUNITY)
Admission: RE | Admit: 2011-05-18 | Discharge: 2011-05-18 | Disposition: A | Discharge status: Home / Self Care | Payer: Medicare Other | Source: Ambulatory Visit | Attending: Oncology | Admitting: Oncology

## 2011-05-18 ENCOUNTER — Other Ambulatory Visit (HOSPITAL_COMMUNITY): Payer: Self-pay | Admitting: Oncology

## 2011-05-18 ENCOUNTER — Encounter (HOSPITAL_COMMUNITY): Payer: Medicare Other | Attending: Oncology

## 2011-05-18 DIAGNOSIS — Z09 Encounter for follow-up examination after completed treatment for conditions other than malignant neoplasm: Secondary | ICD-10-CM | POA: Insufficient documentation

## 2011-05-18 DIAGNOSIS — C3490 Malignant neoplasm of unspecified part of unspecified bronchus or lung: Secondary | ICD-10-CM | POA: Insufficient documentation

## 2011-05-18 DIAGNOSIS — C3410 Malignant neoplasm of upper lobe, unspecified bronchus or lung: Secondary | ICD-10-CM

## 2011-05-18 DIAGNOSIS — Z853 Personal history of malignant neoplasm of breast: Secondary | ICD-10-CM | POA: Insufficient documentation

## 2011-05-18 DIAGNOSIS — E039 Hypothyroidism, unspecified: Secondary | ICD-10-CM | POA: Insufficient documentation

## 2011-05-18 DIAGNOSIS — Z85118 Personal history of other malignant neoplasm of bronchus and lung: Secondary | ICD-10-CM | POA: Insufficient documentation

## 2011-05-18 DIAGNOSIS — C50919 Malignant neoplasm of unspecified site of unspecified female breast: Secondary | ICD-10-CM

## 2011-05-18 LAB — COMPREHENSIVE METABOLIC PANEL
ALT: 11 U/L (ref 0–35)
AST: 19 U/L (ref 0–37)
Albumin: 3.6 g/dL (ref 3.5–5.2)
Alkaline Phosphatase: 89 U/L (ref 39–117)
BUN: 15 mg/dL (ref 6–23)
Chloride: 99 mEq/L (ref 96–112)
Potassium: 4 mEq/L (ref 3.5–5.1)
Sodium: 139 mEq/L (ref 135–145)
Total Bilirubin: 0.3 mg/dL (ref 0.3–1.2)
Total Protein: 8.2 g/dL (ref 6.0–8.3)

## 2011-05-18 LAB — CBC
HCT: 40.5 % (ref 36.0–46.0)
Hemoglobin: 12.8 g/dL (ref 12.0–15.0)
MCHC: 31.6 g/dL (ref 30.0–36.0)
RDW: 13.7 % (ref 11.5–15.5)
WBC: 5.3 10*3/uL (ref 4.0–10.5)

## 2011-05-18 LAB — DIFFERENTIAL
Basophils Relative: 1 % (ref 0–1)
Eosinophils Absolute: 0.3 10*3/uL (ref 0.0–0.7)
Eosinophils Relative: 5 % (ref 0–5)
Lymphs Abs: 2.3 10*3/uL (ref 0.7–4.0)
Monocytes Absolute: 0.6 10*3/uL (ref 0.1–1.0)
Monocytes Relative: 11 % (ref 3–12)

## 2011-05-19 NOTE — Progress Notes (Signed)
Labs drawn today for cbc/diff,cmp 

## 2011-05-20 ENCOUNTER — Encounter (HOSPITAL_BASED_OUTPATIENT_CLINIC_OR_DEPARTMENT_OTHER): Payer: Medicare Other | Admitting: Oncology

## 2011-05-20 VITALS — BP 144/71 | HR 73 | Wt 219.8 lb

## 2011-05-20 DIAGNOSIS — J209 Acute bronchitis, unspecified: Secondary | ICD-10-CM

## 2011-05-20 DIAGNOSIS — C3490 Malignant neoplasm of unspecified part of unspecified bronchus or lung: Secondary | ICD-10-CM

## 2011-05-20 MED ORDER — SULFAMETHOXAZOLE-TRIMETHOPRIM 800-160 MG PO TABS: 1.0000 | ORAL_TABLET | Freq: Two times a day (BID) | ORAL | Status: AC

## 2011-05-20 MED ORDER — ALBUTEROL 90 MCG/ACT IN AERS: 2.0000 | INHALATION_SPRAY | Freq: Four times a day (QID) | RESPIRATORY_TRACT | Status: DC | PRN

## 2011-05-20 NOTE — Progress Notes (Signed)
CC:   Lisa [name], M.D. Lisa [name], M.D.  DIAGNOSES: 1. Stage I (T1a N0) adenocarcinoma of the left upper lobe, status post     resection on 06/01/2005 by Dr. Arlyce Dice with a negative followup thus     far. 2. Stage I (T1a N0 M0) infiltrating ductal carcinoma of the left     breast, estrogen receptor positive at 9%, progesterone receptor     positive at 10%, eight nodes were negative.  Tumor size was 5 mm.     HER2 was weakly positive, but essentially negative they felt.  [name]     therefore she had a stage I, grade 1 cancer.  No lymphovascular     invasion.  Low S-phase fraction of 0.7% in a diploid cancer with     surgery on 07/03/1998, and she was treated tamoxifen for 5 years,     which ended as of September 2004. 3. Longstanding smoking history of a half a pack a day, starting at     age 68, stopping age 89. 91. Hypothyroidism. 5. Colonoscopy in the past. 6. Hypertension. 7. Gastroesophageal reflux disease symptoms. 8. Obesity. 9. Vaginal bleeding in the past that was felt to be benign. 10.Bladder irritation, for she has seen Dr. [place] Pink in the past.  Lisa [name] is on a medication for her breathing, but she cannot remember the name.  It sounds like Advair and were going to try to confirm that.  She has been on it for a few months, but she is not sure when it was started.  What is different is that she is coughing at night almost every night now for the last 3 weeks since the real hot spell.  She has been also bringing up some green phlegm for 2 to 3 weeks, both at nighttime as well as in the morning and she has been wheezing at night.  She has not had fever or chills.  The sputum is at least a teaspoonful at a time several times throughout a 24-hour period.  She is not having any hemoptysis and she had a chest x-ray today which was negative for any obvious recurrence or pneumonia.  For the breast cancer standpoint, she has no lumps anywhere.  She is  on time with mammography, etc.  Her labs the other day were also unremarkable.  Her vital signs do not reveal a temperature today.  Her weight is still excessive at 219 pounds.  PHYSICAL EXAMINATION:  Vital Signs:  Her blood pressure is 144/71, up from last year's 103/64, pulse 72 and regular, respirations 16 to 20 actually, but she is in no acute distress and having no respiratory distress.  She has no pain she states.  Lungs:  Actually clear to auscultation and percussion.  Lymph:  She has no adenopathy.  Breasts: The left breast shows no masses.  All scars are well healed.  The breast is slightly smaller than the right.  The right breast is negative for masses as well.  Heart:  Shows no murmur, rub, or gallop.  Abdomen: Soft, obese, nontender without organomegaly.  Extremities:  She has no peripheral edema.  I think she has low-grade bronchitis, but it has lasted for 3 weeks. Since she has no allergies, I will put her on Bactrim DS twice a day for 7 days and will give her a p.r.n. albuterol inhaler.  She knows to continue her once a day inhaler, which sounds like Advair by her description.  If she is  not better in a week, she is to see either me or Dr. Roger Kill again.  We will do a CT of the chest in a year because of this history of lung cancer and her COPD.  We will see her in 12 months, sooner if need be. She knows, of course to call if she is not better.    ______________________________ Gaston Islam. Tressie Stalker, MD ESN/MEDQ  D:  05/20/2011  T:  05/20/2011  Job:  [id number]

## 2011-05-20 NOTE — Progress Notes (Signed)
This office note has been dictated.

## 2011-07-30 LAB — COMPREHENSIVE METABOLIC PANEL
Albumin: 3.7
BUN: 15
Calcium: 9.6
Glucose, Bld: 97
Sodium: 136
Total Protein: 7.7

## 2011-07-30 LAB — CBC
Hemoglobin: 13.5
MCHC: 35.1
Platelets: 272
RDW: 13.9

## 2011-08-03 LAB — DIFFERENTIAL
Basophils Absolute: 0
Basophils Relative: 1
Eosinophils Absolute: 0.2
Eosinophils Relative: 3
Monocytes Absolute: 0.6

## 2011-08-03 LAB — CBC
HCT: 38.4
Hemoglobin: 12.8
MCV: 89.5
Platelets: 263
RBC: 4.29
WBC: 5.1

## 2011-08-04 LAB — COMPREHENSIVE METABOLIC PANEL
ALT: 16
Albumin: 3.9
Alkaline Phosphatase: 75
Potassium: 4.1
Sodium: 139
Total Protein: 8.3

## 2011-08-04 LAB — CBC
Platelets: 311
RDW: 13.9

## 2011-08-04 LAB — DIFFERENTIAL
Basophils Relative: 1
Eosinophils Absolute: 0.2
Lymphs Abs: 2.1
Monocytes Absolute: 0.6
Monocytes Relative: 9
Neutro Abs: 3.9

## 2011-08-17 ENCOUNTER — Other Ambulatory Visit (HOSPITAL_COMMUNITY): Payer: Self-pay | Admitting: Pediatrics

## 2011-08-17 DIAGNOSIS — Z139 Encounter for screening, unspecified: Secondary | ICD-10-CM

## 2011-09-11 ENCOUNTER — Ambulatory Visit (HOSPITAL_COMMUNITY)
Admission: RE | Admit: 2011-09-11 | Discharge: 2011-09-11 | Disposition: A | Discharge status: Home / Self Care | Payer: Medicare Other | Source: Ambulatory Visit | Attending: Pediatrics | Admitting: Pediatrics

## 2011-09-11 DIAGNOSIS — Z1231 Encounter for screening mammogram for malignant neoplasm of breast: Secondary | ICD-10-CM | POA: Insufficient documentation

## 2011-09-11 DIAGNOSIS — Z139 Encounter for screening, unspecified: Secondary | ICD-10-CM

## 2011-11-20 ENCOUNTER — Ambulatory Visit (HOSPITAL_COMMUNITY): Payer: Medicare Other | Admitting: Oncology

## 2012-03-15 DIAGNOSIS — B351 Tinea unguium: Secondary | ICD-10-CM | POA: Diagnosis not present

## 2012-03-17 DIAGNOSIS — M129 Arthropathy, unspecified: Secondary | ICD-10-CM | POA: Diagnosis not present

## 2012-03-17 DIAGNOSIS — I10 Essential (primary) hypertension: Secondary | ICD-10-CM | POA: Diagnosis not present

## 2012-05-19 ENCOUNTER — Ambulatory Visit (HOSPITAL_COMMUNITY)
Admission: RE | Admit: 2012-05-19 | Discharge: 2012-05-19 | Disposition: A | Discharge status: Home / Self Care | Payer: Medicare Other | Source: Ambulatory Visit | Attending: Oncology | Admitting: Oncology

## 2012-05-19 ENCOUNTER — Encounter (HOSPITAL_COMMUNITY): Payer: Medicare Other | Attending: Oncology

## 2012-05-19 DIAGNOSIS — J209 Acute bronchitis, unspecified: Secondary | ICD-10-CM

## 2012-05-19 DIAGNOSIS — Z09 Encounter for follow-up examination after completed treatment for conditions other than malignant neoplasm: Secondary | ICD-10-CM | POA: Insufficient documentation

## 2012-05-19 DIAGNOSIS — Z853 Personal history of malignant neoplasm of breast: Secondary | ICD-10-CM | POA: Diagnosis not present

## 2012-05-19 DIAGNOSIS — K219 Gastro-esophageal reflux disease without esophagitis: Secondary | ICD-10-CM | POA: Diagnosis not present

## 2012-05-19 DIAGNOSIS — I10 Essential (primary) hypertension: Secondary | ICD-10-CM | POA: Diagnosis not present

## 2012-05-19 DIAGNOSIS — I2510 Atherosclerotic heart disease of native coronary artery without angina pectoris: Secondary | ICD-10-CM | POA: Insufficient documentation

## 2012-05-19 DIAGNOSIS — J438 Other emphysema: Secondary | ICD-10-CM | POA: Diagnosis not present

## 2012-05-19 DIAGNOSIS — C3490 Malignant neoplasm of unspecified part of unspecified bronchus or lung: Secondary | ICD-10-CM | POA: Diagnosis not present

## 2012-05-19 DIAGNOSIS — Z9889 Other specified postprocedural states: Secondary | ICD-10-CM | POA: Insufficient documentation

## 2012-05-19 DIAGNOSIS — Z85118 Personal history of other malignant neoplasm of bronchus and lung: Secondary | ICD-10-CM | POA: Insufficient documentation

## 2012-05-19 DIAGNOSIS — K449 Diaphragmatic hernia without obstruction or gangrene: Secondary | ICD-10-CM | POA: Insufficient documentation

## 2012-05-19 DIAGNOSIS — Z923 Personal history of irradiation: Secondary | ICD-10-CM | POA: Insufficient documentation

## 2012-05-19 LAB — COMPREHENSIVE METABOLIC PANEL
ALT: 11 U/L (ref 0–35)
Calcium: 10.2 mg/dL (ref 8.4–10.5)
Creatinine, Ser: 0.91 mg/dL (ref 0.50–1.10)
GFR calc Af Amer: 69 mL/min — ABNORMAL LOW (ref >90)
GFR calc non Af Amer: 60 mL/min — ABNORMAL LOW (ref >90)
Glucose, Bld: 98 mg/dL (ref 70–99)
Sodium: 142 mEq/L (ref 135–145)
Total Protein: 8.2 g/dL (ref 6.0–8.3)

## 2012-05-19 LAB — CBC
Hemoglobin: 13.8 g/dL (ref 12.0–15.0)
MCH: 29.1 pg (ref 26.0–34.0)
MCHC: 32.2 g/dL (ref 30.0–36.0)
MCV: 90.3 fL (ref 78.0–100.0)

## 2012-05-19 NOTE — Progress Notes (Signed)
Labs drawn today for cbc,cmp

## 2012-05-20 ENCOUNTER — Ambulatory Visit (HOSPITAL_COMMUNITY): Payer: Medicare Other | Admitting: Oncology

## 2012-05-23 ENCOUNTER — Encounter (HOSPITAL_BASED_OUTPATIENT_CLINIC_OR_DEPARTMENT_OTHER): Payer: Medicare Other | Admitting: Oncology

## 2012-05-23 ENCOUNTER — Encounter (HOSPITAL_COMMUNITY): Payer: Self-pay | Admitting: Oncology

## 2012-05-23 VITALS — BP 98/58 | HR 68 | Temp 98.0°F | Ht 65.75 in | Wt 218.2 lb

## 2012-05-23 DIAGNOSIS — Z853 Personal history of malignant neoplasm of breast: Secondary | ICD-10-CM | POA: Diagnosis not present

## 2012-05-23 DIAGNOSIS — Z85118 Personal history of other malignant neoplasm of bronchus and lung: Secondary | ICD-10-CM

## 2012-05-23 DIAGNOSIS — C3490 Malignant neoplasm of unspecified part of unspecified bronchus or lung: Secondary | ICD-10-CM

## 2012-05-23 MED ORDER — ESOMEPRAZOLE MAGNESIUM 40 MG PO CPDR: 40.0000 mg | DELAYED_RELEASE_CAPSULE | Freq: Every day | ORAL | Status: DC

## 2012-05-23 NOTE — Patient Instructions (Addendum)
Lisa [name]  [id number] Feb 12, 1934 Dr. Everardo All   [facility]  Discharge Instructions  RECOMMENDATIONS MADE BY THE CONSULTANT AND ANY TEST RESULTS WILL BE SENT TO YOUR REFERRING DOCTOR.   EXAM FINDINGS BY MD TODAY AND SIGNS AND SYMPTOMS TO REPORT TO CLINIC OR PRIMARY MD: exam and discussion per MD.  You are doing well.   We will give you some nexium for your indigestion symptoms.  Need to take it for 2 months.  If no better at the end of 2 months, see your primary care physician.  MEDICATIONS PRESCRIBED: Nexium 40 mg daily Follow label directions  INSTRUCTIONS GIVEN AND DISCUSSED: Other :  Report any new lumps, bone pain, shortness of breath, blood in your sputum, etc.  SPECIAL INSTRUCTIONS/FOLLOW-UP: Xray Studies Needed in 1 year CT of chest and Return to Clinic to see MD a few days after the CT.   I acknowledge that I have been informed and understand all the instructions given to me and received a copy. I do not have any more questions at this time, but understand that I may call the Specialty Clinic at [facility] at [phone] during business hours should I have any further questions or need assistance in obtaining follow-up care.    __________________________________________  _____________  __________ Signature of Patient or Authorized Representative            Date                   Time    __________________________________________ Nurse's Signature

## 2012-05-23 NOTE — Progress Notes (Signed)
Problem number 1 stage I (T1 A. N0) adenocarcinoma the left upper lobe status post resection on 06/01/2005 by Dr. Cleatrice Burke with no evidence for recurrence or new disease since. CT scan of the day was unremarkable  Problem #2 stage I (T1 A. N0 M0) infiltrating ductal carcinoma left breast ER +9% PR +10% 8 nodes were negative tumor size was 5 mm her to do with HER-2/neu was weakly positive but felt to be negative. She had a grade 1 stage I cancer no LV I. low S-phase fraction 0.7% and a diploid cancer with surgery on 07/03/1998 through 5 years of tamoxifen ending September 2004 no evidence recurrence. Next smoking history of' 23 pack years stopping at age 66 next hypothyroidism next colonoscopy in the past 6 hypertension treated by Dr. Cleta Alberts next GERD and she has symptoms once again coughing and moist especially after bananas coffee etc. and I will prescribe heard Nexium 40 mg a day but she said better at 3 weeks she is to see her primary care physician. Next occasional UTI.  Lisa [name] has done well since I've seen her one year ago and her CT scan of the day is once again negative. On review of systems oncologicall he she is having reflux burning in her chest coughing especially after she eats foods that relax his sphincter. So I will treated with Nexium for 2 months and if she is not better she is to see Dr. Cleta Alberts  Physical exam shows stable vital signs. She still overweight for her height at 218 pounds a 5 foot 6 inch frame. But she is in no acute distress. Her lungs are perfectly clear. Scar is well healed. The left breast is also mildly pigmented but without masses the right breast is without masses. Her heart shows a regular rhythm and rate with what appears to be a grade 1/6 systolic ejection murmur. There is no S3 gallop. Abdomen is obese nontender without obvious organomegaly. She has no peripheral edema the arms or legs.  We'll see her back in one year with a CT scan of the chest.

## 2012-08-01 ENCOUNTER — Ambulatory Visit: Payer: Medicare Other | Admitting: Family Medicine

## 2012-08-10 ENCOUNTER — Other Ambulatory Visit (HOSPITAL_COMMUNITY): Payer: Self-pay | Admitting: Internal Medicine

## 2012-08-10 DIAGNOSIS — Z139 Encounter for screening, unspecified: Secondary | ICD-10-CM

## 2012-08-16 DIAGNOSIS — B351 Tinea unguium: Secondary | ICD-10-CM | POA: Diagnosis not present

## 2012-08-25 DIAGNOSIS — Z23 Encounter for immunization: Secondary | ICD-10-CM | POA: Diagnosis not present

## 2012-08-25 DIAGNOSIS — G589 Mononeuropathy, unspecified: Secondary | ICD-10-CM | POA: Diagnosis not present

## 2012-08-25 DIAGNOSIS — E039 Hypothyroidism, unspecified: Secondary | ICD-10-CM | POA: Diagnosis not present

## 2012-08-25 DIAGNOSIS — I10 Essential (primary) hypertension: Secondary | ICD-10-CM | POA: Diagnosis not present

## 2012-09-12 ENCOUNTER — Ambulatory Visit (HOSPITAL_COMMUNITY)
Admission: RE | Admit: 2012-09-12 | Discharge: 2012-09-12 | Disposition: A | Discharge status: Home / Self Care | Payer: Medicare Other | Source: Ambulatory Visit | Attending: Internal Medicine | Admitting: Internal Medicine

## 2012-09-12 DIAGNOSIS — Z1231 Encounter for screening mammogram for malignant neoplasm of breast: Secondary | ICD-10-CM | POA: Diagnosis not present

## 2012-09-12 DIAGNOSIS — Z139 Encounter for screening, unspecified: Secondary | ICD-10-CM

## 2012-09-26 DIAGNOSIS — G589 Mononeuropathy, unspecified: Secondary | ICD-10-CM | POA: Diagnosis not present

## 2012-09-26 DIAGNOSIS — G609 Hereditary and idiopathic neuropathy, unspecified: Secondary | ICD-10-CM | POA: Diagnosis not present

## 2012-09-26 DIAGNOSIS — I10 Essential (primary) hypertension: Secondary | ICD-10-CM | POA: Diagnosis not present

## 2012-09-26 DIAGNOSIS — E039 Hypothyroidism, unspecified: Secondary | ICD-10-CM | POA: Diagnosis not present

## 2012-09-26 DIAGNOSIS — K219 Gastro-esophageal reflux disease without esophagitis: Secondary | ICD-10-CM | POA: Diagnosis not present

## 2012-11-18 DIAGNOSIS — H1045 Other chronic allergic conjunctivitis: Secondary | ICD-10-CM | POA: Diagnosis not present

## 2012-11-18 DIAGNOSIS — Z961 Presence of intraocular lens: Secondary | ICD-10-CM | POA: Diagnosis not present

## 2012-11-18 DIAGNOSIS — H52229 Regular astigmatism, unspecified eye: Secondary | ICD-10-CM | POA: Diagnosis not present

## 2012-11-18 DIAGNOSIS — H5200 Hypermetropia, unspecified eye: Secondary | ICD-10-CM | POA: Diagnosis not present

## 2012-12-27 DIAGNOSIS — J45909 Unspecified asthma, uncomplicated: Secondary | ICD-10-CM | POA: Diagnosis not present

## 2012-12-27 DIAGNOSIS — I10 Essential (primary) hypertension: Secondary | ICD-10-CM | POA: Diagnosis not present

## 2012-12-27 DIAGNOSIS — E039 Hypothyroidism, unspecified: Secondary | ICD-10-CM | POA: Diagnosis not present

## 2013-01-18 ENCOUNTER — Telehealth: Payer: Self-pay | Admitting: *Deleted

## 2013-01-18 NOTE — Telephone Encounter (Signed)
Called and spoke to Eustace. Pt has a hx of adenomatous polyps. Scheduled OV with Laban Emperor, NP on 02/08/2013 at 9:30 AM.

## 2013-01-18 NOTE — Telephone Encounter (Signed)
Ms Kovalsky daughter, [name], called today for her mother to be triaged. Please call her back. Thank you.

## 2013-02-03 ENCOUNTER — Encounter: Payer: Self-pay | Admitting: Internal Medicine

## 2013-02-08 ENCOUNTER — Encounter: Payer: Self-pay | Admitting: Gastroenterology

## 2013-02-08 ENCOUNTER — Ambulatory Visit (INDEPENDENT_AMBULATORY_CARE_PROVIDER_SITE_OTHER): Payer: Medicare Other | Admitting: Gastroenterology

## 2013-02-08 VITALS — BP 96/56 | HR 77 | Temp 97.5°F | Ht 67.0 in | Wt 212.4 lb

## 2013-02-08 DIAGNOSIS — D369 Benign neoplasm, unspecified site: Secondary | ICD-10-CM | POA: Diagnosis not present

## 2013-02-08 NOTE — Assessment & Plan Note (Signed)
Pleasant African-American 79 year old female with history of adenomatous polyps, due for surveillance now. Last colonoscopy in 2009 by Dr. Gala Romney with hyperplastic polyps. She has no lower or upper GI complaints whatsoever.   Proceed with TCS with Dr. Gala Romney in near future: the risks, benefits, and alternatives have been discussed with the patient in detail. The patient states understanding and desires to proceed.

## 2013-02-08 NOTE — Patient Instructions (Addendum)
We have scheduled you for a colonoscopy with Dr. Gala Romney in the near future.  Have a wonderful day!

## 2013-02-08 NOTE — Progress Notes (Signed)
Cc PCP 

## 2013-02-08 NOTE — Progress Notes (Signed)
Referring Provider: Wende Neighbors, MD Primary Care Physician:  Wende Neighbors, MD Primary Gastroenterologist:  Dr. Gala Romney   Chief Complaint  Patient presents with  . Colonoscopy    HPI:   Lisa [name] is a pleasant 79 year old female with a history of adenomatous polyps, presenting at the request of Dr. [place] Crane for surveillance colonoscopy. Last colonoscopy by Dr. Gala Romney in 2009 with hyperplastic polyps. She has no complaints today. Denies abdominal pain, rectal bleeding, changes in bowel habits. No N/V or upper GI symptoms. Does note rare reflux if she eats cabbage or certain types of aggravating foods. Will take OTC meds with good relief. Weight is stable.    Past Medical History  Diagnosis Date  . Hypertension   . Cancer of breast, female   . Cancer of lung   . Hypothyroidism     Past Surgical History  Procedure Laterality Date  . Breast biopsy      left axillary node dissection  . Foot surgery    . Lung removal, partial      upper lobe  . Colonoscopy  03/08/2003    [id number] rectal polyps destroyed with the tip of the snare/Polyps at hepatic flexure, splenic flexure at 35 cm/Left-sided diverticula: unable to retrieve path  . Colonoscopy  09/12/2008    [id number] rectum and distal sigmoid diminutive polyps/scattered left sided diverticulum. hyperplastic    Current Outpatient Prescriptions  Medication Sig Dispense Refill  . Acetaminophen (TYLENOL ARTHRITIS PAIN PO) Take by mouth as needed.        Marland Kitchen albuterol (PROVENTIL,VENTOLIN) 90 MCG/ACT inhaler Inhale 2 puffs into the lungs every 6 (six) hours as needed for wheezing.      . Alum Hydroxide-Mag Carbonate (GAVISCON PO) Take by mouth as needed.      Marland Kitchen aspirin 81 MG tablet Take 81 mg by mouth daily.        Marland Kitchen levothyroxine (SYNTHROID, LEVOTHROID) 100 MCG tablet Take 100 mcg by mouth daily.       . metoprolol succinate (TOPROL-XL) 50 MG 24 hr tablet Take 50 mg by mouth daily. Take with or immediately following a meal.      .  telmisartan-hydrochlorothiazide (MICARDIS HCT) 80-12.5 MG per tablet Take 1 tablet by mouth daily.      . traMADol (ULTRAM) 50 MG tablet Take 50 mg by mouth every 4 (four) hours as needed. May take 1-2 tabs as needed         No current facility-administered medications for this visit.    Allergies as of 02/08/2013  . (No Known Allergies)    Family History  Problem Relation Age of Onset  . Cancer Mother   . Cancer Sister   . Colon cancer Neg Hx     History   Social History  . Marital Status: Widowed    Spouse Name: N/A    Number of Children: N/A  . Years of Education: N/A   Occupational History  . Not on file.   Social History Main Topics  . Smoking status: Former Smoker -- 0.75 packs/day for 14 years  . Smokeless tobacco: Never Used     Comment: smoke-free X 30 yeras  . Alcohol Use: No  . Drug Use: No  . Sexually Active: Not on file   Other Topics Concern  . Not on file   Social History Narrative  . No narrative on file    Review of Systems: Gen: Denies any fever, chills, loss of appetite, fatigue, weight loss. CV: Denies chest pain, heart palpitations, syncope,  peripheral edema. Resp: Denies shortness of breath with rest, cough, wheezing GI: SEE HPI GU : Denies urinary burning, urinary frequency, urinary incontinence.  MS: +arthritis Derm: Denies rash, itching, dry skin Psych: Denies depression, anxiety, confusion or memory loss  Heme: Denies bruising, bleeding, and enlarged lymph nodes.  Physical Exam: BP 96/56  Pulse 77  Temp(Src) 97.5 F (36.4 C) (Oral)  Ht 5\' 7"  (1.702 m)  Wt 212 lb 6.4 oz (96.344 kg)  BMI 33.26 kg/m2 General:   Alert and oriented. Well-developed, well-nourished, pleasant and cooperative. Head:  Normocephalic and atraumatic. Eyes:  Conjunctiva pink, sclera clear, no icterus.   Conjunctiva pink. Ears:  Normal auditory acuity. Nose:  No deformity, discharge,  or lesions. Mouth:  No deformity or lesions, mucosa pink and moist.   Neck:  Supple, without mass or thyromegaly. Lungs:  Clear to auscultation bilaterally, without wheezing, rales, or rhonchi.  Heart:  S1, S2 present without murmurs noted.  Abdomen:  +BS, soft, non-tender and non-distended. Without mass or HSM. No rebound or guarding. No hernias noted. Rectal:  Deferred  Msk:  Symmetrical without gross deformities. Normal posture. Extremities:  Without clubbing or edema. Neurologic:  Alert and  oriented x4;  grossly normal neurologically. Skin:  Intact, warm and dry without significant lesions or rashes Cervical Nodes:  No significant cervical adenopathy. Psych:  Alert and cooperative. Normal mood and affect.

## 2013-02-14 DIAGNOSIS — B351 Tinea unguium: Secondary | ICD-10-CM | POA: Diagnosis not present

## 2013-02-23 ENCOUNTER — Encounter (HOSPITAL_COMMUNITY): Payer: Self-pay | Admitting: Pharmacy Technician

## 2013-03-03 ENCOUNTER — Other Ambulatory Visit: Payer: Self-pay | Admitting: Internal Medicine

## 2013-03-03 ENCOUNTER — Telehealth: Payer: Self-pay | Admitting: Internal Medicine

## 2013-03-03 MED ORDER — PEG-KCL-NACL-NASULF-NA ASC-C 100 G PO SOLR: 1.0000 | ORAL | Status: DC

## 2013-03-03 NOTE — Telephone Encounter (Signed)
Pt is scheduled for tcs on 5/5 with [facility] and called [place] to say that [facility] has not received her Prep prescription. Can we re-send her prescription

## 2013-03-03 NOTE — Telephone Encounter (Signed)
DONE

## 2013-03-06 ENCOUNTER — Encounter (HOSPITAL_COMMUNITY): Payer: Self-pay | Admitting: *Deleted

## 2013-03-06 ENCOUNTER — Encounter (HOSPITAL_COMMUNITY)
Admission: RE | Disposition: A | Discharge status: Home / Self Care | Payer: Self-pay | Source: Ambulatory Visit | Attending: Internal Medicine

## 2013-03-06 ENCOUNTER — Ambulatory Visit (HOSPITAL_COMMUNITY)
Admission: RE | Admit: 2013-03-06 | Discharge: 2013-03-06 | Disposition: A | Discharge status: Home / Self Care | Payer: Medicare Other | Source: Ambulatory Visit | Attending: Internal Medicine | Admitting: Internal Medicine

## 2013-03-06 DIAGNOSIS — I10 Essential (primary) hypertension: Secondary | ICD-10-CM | POA: Insufficient documentation

## 2013-03-06 DIAGNOSIS — D369 Benign neoplasm, unspecified site: Secondary | ICD-10-CM

## 2013-03-06 DIAGNOSIS — Z860100 Personal history of colon polyps, unspecified: Secondary | ICD-10-CM

## 2013-03-06 DIAGNOSIS — D126 Benign neoplasm of colon, unspecified: Secondary | ICD-10-CM | POA: Insufficient documentation

## 2013-03-06 DIAGNOSIS — Z85118 Personal history of other malignant neoplasm of bronchus and lung: Secondary | ICD-10-CM | POA: Diagnosis not present

## 2013-03-06 DIAGNOSIS — E039 Hypothyroidism, unspecified: Secondary | ICD-10-CM | POA: Insufficient documentation

## 2013-03-06 DIAGNOSIS — Z853 Personal history of malignant neoplasm of breast: Secondary | ICD-10-CM | POA: Insufficient documentation

## 2013-03-06 DIAGNOSIS — Z79899 Other long term (current) drug therapy: Secondary | ICD-10-CM | POA: Diagnosis not present

## 2013-03-06 DIAGNOSIS — Z87891 Personal history of nicotine dependence: Secondary | ICD-10-CM | POA: Diagnosis not present

## 2013-03-06 DIAGNOSIS — Z1211 Encounter for screening for malignant neoplasm of colon: Secondary | ICD-10-CM

## 2013-03-06 DIAGNOSIS — Z902 Acquired absence of lung [part of]: Secondary | ICD-10-CM | POA: Insufficient documentation

## 2013-03-06 HISTORY — PX: COLONOSCOPY: SHX5424

## 2013-03-06 SURGERY — COLONOSCOPY
Anesthesia: Moderate Sedation

## 2013-03-06 MED ORDER — MEPERIDINE HCL 100 MG/ML IJ SOLN
INTRAMUSCULAR | Status: AC
  Filled 2013-03-06: qty 2

## 2013-03-06 MED ORDER — ONDANSETRON HCL 4 MG/2ML IJ SOLN
INTRAMUSCULAR | Status: AC
  Filled 2013-03-06: qty 2

## 2013-03-06 MED ORDER — MEPERIDINE HCL 100 MG/ML IJ SOLN
INTRAMUSCULAR | Status: DC | PRN
  Administered 2013-03-06: 25 mg via INTRAVENOUS
  Administered 2013-03-06: 50 mg via INTRAVENOUS

## 2013-03-06 MED ORDER — SODIUM CHLORIDE 0.9 % IV SOLN: INTRAVENOUS | Status: DC

## 2013-03-06 MED ORDER — MIDAZOLAM HCL 5 MG/5ML IJ SOLN
INTRAMUSCULAR | Status: AC
  Filled 2013-03-06: qty 10

## 2013-03-06 MED ORDER — STERILE WATER FOR IRRIGATION IR SOLN
Status: DC | PRN
  Administered 2013-03-06: 08:00:00

## 2013-03-06 MED ORDER — ONDANSETRON HCL 4 MG/2ML IJ SOLN
INTRAMUSCULAR | Status: DC | PRN
  Administered 2013-03-06: 4 mg via INTRAVENOUS

## 2013-03-06 MED ORDER — MIDAZOLAM HCL 5 MG/5ML IJ SOLN
INTRAMUSCULAR | Status: DC | PRN
  Administered 2013-03-06 (×2): 1 mg via INTRAVENOUS
  Administered 2013-03-06: 2 mg via INTRAVENOUS

## 2013-03-06 NOTE — Interval H&P Note (Signed)
History and Physical Interval Note:  03/06/2013 8:29 AM  Lisa [name]  has presented today for surgery, with the diagnosis of History of Adenomatous polyps  The various methods of treatment have been discussed with the patient and family. After consideration of risks, benefits and other options for treatment, the patient has consented to  Procedure(s) with comments: COLONOSCOPY (N/A) - 8:30 as a surgical intervention .  The patient's history has been reviewed, patient examined, no change in status, stable for surgery.  I have reviewed the patient's chart and labs.  Questions were answered to the patient's satisfaction.     Distant history colonic adenoma, hyperplastic polyp last exam 5 years ago. Path before that unknown  - the polyp was small. Colonoscopy is now being done as a surveillance maneuver. The risks, benefits, limitations, alternatives and imponderables have been reviewed with the patient. Questions have been answered. All parties are agreeable.    Manus Rudd

## 2013-03-06 NOTE — H&P (View-Only) (Signed)
Referring Provider: Wende Neighbors, MD Primary Care Physician:  Wende Neighbors, MD Primary Gastroenterologist:  Dr. Gala Romney   Chief Complaint  Patient presents with  . Colonoscopy    HPI:   Lisa [name] is a pleasant 79 year old female with a history of adenomatous polyps, presenting at the request of Dr. Nevada Crane for surveillance colonoscopy. Last colonoscopy by Dr. Gala Romney in 2009 with hyperplastic polyps. She has no complaints today. Denies abdominal pain, rectal bleeding, changes in bowel habits. No N/V or upper GI symptoms. Does note rare reflux if she eats cabbage or certain types of aggravating foods. Will take OTC meds with good relief. Weight is stable.    Past Medical History  Diagnosis Date  . Hypertension   . Cancer of breast, female   . Cancer of lung   . Hypothyroidism     Past Surgical History  Procedure Laterality Date  . Breast biopsy      left axillary node dissection  . Foot surgery    . Lung removal, partial      upper lobe  . Colonoscopy  03/08/2003    [id number] rectal polyps destroyed with the tip of the snare/Polyps at hepatic flexure, splenic flexure at 35 cm/Left-sided diverticula: unable to retrieve path  . Colonoscopy  09/12/2008    [id number] rectum and distal sigmoid diminutive polyps/scattered left sided diverticulum. hyperplastic    Current Outpatient Prescriptions  Medication Sig Dispense Refill  . Acetaminophen (TYLENOL ARTHRITIS PAIN PO) Take by mouth as needed.        Marland Kitchen albuterol (PROVENTIL,VENTOLIN) 90 MCG/ACT inhaler Inhale 2 puffs into the lungs every 6 (six) hours as needed for wheezing.      . Alum Hydroxide-Mag Carbonate (GAVISCON PO) Take by mouth as needed.      Marland Kitchen aspirin 81 MG tablet Take 81 mg by mouth daily.        Marland Kitchen levothyroxine (SYNTHROID, LEVOTHROID) 100 MCG tablet Take 100 mcg by mouth daily.       . metoprolol succinate (TOPROL-XL) 50 MG 24 hr tablet Take 50 mg by mouth daily. Take with or immediately following a meal.      .  telmisartan-hydrochlorothiazide (MICARDIS HCT) 80-12.5 MG per tablet Take 1 tablet by mouth daily.      . traMADol (ULTRAM) 50 MG tablet Take 50 mg by mouth every 4 (four) hours as needed. May take 1-2 tabs as needed         No current facility-administered medications for this visit.    Allergies as of 02/08/2013  . (No Known Allergies)    Family History  Problem Relation Age of Onset  . Cancer Mother   . Cancer Sister   . Colon cancer Neg Hx     History   Social History  . Marital Status: Widowed    Spouse Name: N/A    Number of Children: N/A  . Years of Education: N/A   Occupational History  . Not on file.   Social History Main Topics  . Smoking status: Former Smoker -- 0.75 packs/day for 14 years  . Smokeless tobacco: Never Used     Comment: smoke-free X 30 yeras  . Alcohol Use: No  . Drug Use: No  . Sexually Active: Not on file   Other Topics Concern  . Not on file   Social History Narrative  . No narrative on file    Review of Systems: Gen: Denies any fever, chills, loss of appetite, fatigue, weight loss. CV: Denies chest pain, heart palpitations, syncope,  peripheral edema. Resp: Denies shortness of breath with rest, cough, wheezing GI: SEE HPI GU : Denies urinary burning, urinary frequency, urinary incontinence.  MS: +arthritis Derm: Denies rash, itching, dry skin Psych: Denies depression, anxiety, confusion or memory loss  Heme: Denies bruising, bleeding, and enlarged lymph nodes.  Physical Exam: BP 96/56  Pulse 77  Temp(Src) 97.5 F (36.4 C) (Oral)  Ht 5\' 7"  (1.702 m)  Wt 212 lb 6.4 oz (96.344 kg)  BMI 33.26 kg/m2 General:   Alert and oriented. Well-developed, well-nourished, pleasant and cooperative. Head:  Normocephalic and atraumatic. Eyes:  Conjunctiva pink, sclera clear, no icterus.   Conjunctiva pink. Ears:  Normal auditory acuity. Nose:  No deformity, discharge,  or lesions. Mouth:  No deformity or lesions, mucosa pink and moist.   Neck:  Supple, without mass or thyromegaly. Lungs:  Clear to auscultation bilaterally, without wheezing, rales, or rhonchi.  Heart:  S1, S2 present without murmurs noted.  Abdomen:  +BS, soft, non-tender and non-distended. Without mass or HSM. No rebound or guarding. No hernias noted. Rectal:  Deferred  Msk:  Symmetrical without gross deformities. Normal posture. Extremities:  Without clubbing or edema. Neurologic:  Alert and  oriented x4;  grossly normal neurologically. Skin:  Intact, warm and dry without significant lesions or rashes Cervical Nodes:  No significant cervical adenopathy. Psych:  Alert and cooperative. Normal mood and affect.

## 2013-03-06 NOTE — Op Note (Signed)
[facility] [address] [place], [address]   COLONOSCOPY PROCEDURE REPORT  PATIENT: Lisa [name], Lisa [name]  MR#:         [id number] BIRTHDATE: 1934-08-21 , 78  yrs. old GENDER: Female ENDOSCOPIST: R.  Garfield Cornea, MD FACP FACG REFERRED BY:  Delphina Cahill, M.D. PROCEDURE DATE:  03/06/2013 PROCEDURE:     Colonoscopy with biopsy  INDICATIONS: History of colonic adenoma  INFORMED CONSENT:  The risks, benefits, alternatives and imponderables including but not limited to bleeding, perforation as well as the possibility of a missed lesion have been reviewed.  The potential for biopsy, lesion removal, etc. have also been discussed.  Questions have been answered.  All parties agreeable. Please see the history and physical in the medical record for more information.  MEDICATIONS: Versed 4 mg IV and Demerol 50 mg in divided doses. Zofran 4 mg IV.  DESCRIPTION OF PROCEDURE:  After a digital rectal exam was performed, the EC-[id number])  colonoscope was advanced from the anus through the rectum and colon to the area of the cecum, ileocecal valve and appendiceal orifice.  The cecum was deeply intubated.  These structures were well-seen and photographed for the record.  From the level of the cecum and ileocecal valve, the scope was slowly and cautiously withdrawn.  The mucosal surfaces were carefully surveyed utilizing scope tip deflection to facilitate fold flattening as needed.  The scope was pulled down into the rectum where a thorough examination including retroflexion was performed.    FINDINGS:  Adequate preparation. Normal rectum. Scattered left-sided diverticula; (1) diminutive polyp in the mid sigmoid segment; otherwise, the remainder of colonic mucosa appeared normal.  THERAPEUTIC / DIAGNOSTIC MANEUVERS PERFORMED:  The above-mentioned polyps cold biopsied/removed  COMPLICATIONS: None  CECAL WITHDRAWAL TIME:  10 minutes  IMPRESSION:  Colonic  polyp-removed as described above; colonic diverticulosis  RECOMMENDATIONS: Followup on pathology.   _______________________________ eSigned:  R. Garfield Cornea, MD FACP [facility] 03/06/2013 9:13 AM   CC:

## 2013-03-07 ENCOUNTER — Encounter: Payer: Self-pay | Admitting: Internal Medicine

## 2013-03-07 ENCOUNTER — Encounter (HOSPITAL_COMMUNITY): Payer: Self-pay | Admitting: Internal Medicine

## 2013-03-22 ENCOUNTER — Other Ambulatory Visit (HOSPITAL_COMMUNITY): Payer: Self-pay | Admitting: Internal Medicine

## 2013-03-22 DIAGNOSIS — M810 Age-related osteoporosis without current pathological fracture: Secondary | ICD-10-CM

## 2013-03-22 DIAGNOSIS — E039 Hypothyroidism, unspecified: Secondary | ICD-10-CM | POA: Diagnosis not present

## 2013-03-22 DIAGNOSIS — J45909 Unspecified asthma, uncomplicated: Secondary | ICD-10-CM | POA: Diagnosis not present

## 2013-03-22 DIAGNOSIS — M899 Disorder of bone, unspecified: Secondary | ICD-10-CM | POA: Diagnosis not present

## 2013-03-22 DIAGNOSIS — I10 Essential (primary) hypertension: Secondary | ICD-10-CM | POA: Diagnosis not present

## 2013-03-22 DIAGNOSIS — G589 Mononeuropathy, unspecified: Secondary | ICD-10-CM | POA: Diagnosis not present

## 2013-03-28 ENCOUNTER — Other Ambulatory Visit (HOSPITAL_COMMUNITY): Payer: Medicare Other

## 2013-04-17 ENCOUNTER — Ambulatory Visit (HOSPITAL_COMMUNITY)
Admission: RE | Admit: 2013-04-17 | Discharge: 2013-04-17 | Disposition: A | Discharge status: Home / Self Care | Payer: Medicare Other | Source: Ambulatory Visit | Attending: Internal Medicine | Admitting: Internal Medicine

## 2013-04-17 DIAGNOSIS — M899 Disorder of bone, unspecified: Secondary | ICD-10-CM | POA: Diagnosis not present

## 2013-04-17 DIAGNOSIS — Z853 Personal history of malignant neoplasm of breast: Secondary | ICD-10-CM | POA: Insufficient documentation

## 2013-04-17 DIAGNOSIS — Z780 Asymptomatic menopausal state: Secondary | ICD-10-CM | POA: Diagnosis not present

## 2013-04-17 DIAGNOSIS — M810 Age-related osteoporosis without current pathological fracture: Secondary | ICD-10-CM

## 2013-04-17 DIAGNOSIS — M949 Disorder of cartilage, unspecified: Secondary | ICD-10-CM | POA: Diagnosis not present

## 2013-05-01 DIAGNOSIS — M899 Disorder of bone, unspecified: Secondary | ICD-10-CM | POA: Diagnosis not present

## 2013-05-01 DIAGNOSIS — M810 Age-related osteoporosis without current pathological fracture: Secondary | ICD-10-CM | POA: Diagnosis not present

## 2013-05-01 DIAGNOSIS — E039 Hypothyroidism, unspecified: Secondary | ICD-10-CM | POA: Diagnosis not present

## 2013-05-01 DIAGNOSIS — M949 Disorder of cartilage, unspecified: Secondary | ICD-10-CM | POA: Diagnosis not present

## 2013-05-23 ENCOUNTER — Ambulatory Visit (HOSPITAL_COMMUNITY)
Admission: RE | Admit: 2013-05-23 | Discharge: 2013-05-23 | Disposition: A | Discharge status: Home / Self Care | Payer: Medicare Other | Source: Ambulatory Visit | Attending: Oncology | Admitting: Oncology

## 2013-05-23 DIAGNOSIS — R911 Solitary pulmonary nodule: Secondary | ICD-10-CM | POA: Insufficient documentation

## 2013-05-23 DIAGNOSIS — I2510 Atherosclerotic heart disease of native coronary artery without angina pectoris: Secondary | ICD-10-CM | POA: Diagnosis not present

## 2013-05-23 DIAGNOSIS — C3490 Malignant neoplasm of unspecified part of unspecified bronchus or lung: Secondary | ICD-10-CM | POA: Diagnosis not present

## 2013-05-26 ENCOUNTER — Encounter (HOSPITAL_COMMUNITY): Payer: Self-pay

## 2013-05-26 ENCOUNTER — Encounter (HOSPITAL_COMMUNITY): Payer: Medicare Other | Attending: Hematology and Oncology

## 2013-05-26 VITALS — BP 125/71 | HR 58 | Temp 98.2°F | Resp 16 | Wt 216.7 lb

## 2013-05-26 DIAGNOSIS — C50919 Malignant neoplasm of unspecified site of unspecified female breast: Secondary | ICD-10-CM

## 2013-05-26 DIAGNOSIS — C50912 Malignant neoplasm of unspecified site of left female breast: Secondary | ICD-10-CM

## 2013-05-26 DIAGNOSIS — R911 Solitary pulmonary nodule: Secondary | ICD-10-CM | POA: Diagnosis not present

## 2013-05-26 DIAGNOSIS — C3490 Malignant neoplasm of unspecified part of unspecified bronchus or lung: Secondary | ICD-10-CM

## 2013-05-26 NOTE — Patient Instructions (Signed)
.  [place] Discharge Instructions  RECOMMENDATIONS MADE BY THE CONSULTANT AND ANY TEST RESULTS WILL BE SENT TO YOUR REFERRING PHYSICIAN.  EXAM FINDINGS BY THE PHYSICIAN TODAY AND SIGNS OR SYMPTOMS TO REPORT TO CLINIC OR PRIMARY PHYSICIAN:  Very small lung nodule seen on ct scan Very small chance that this is cancer, recommendations are to repeat scan in 1 year   INSTRUCTIONS GIVEN AND DISCUSSED: Report a persistent cough for greater than 4 weeks or chest pain  SPECIAL INSTRUCTIONS/FOLLOW-UP: Scans and return visit in one year  Thank you for choosing [facility] to provide your oncology and hematology care.  To afford each patient quality time with our providers, please arrive at least 15 minutes before your scheduled appointment time.  With your help, our goal is to use those 15 minutes to complete the necessary work-up to ensure our physicians have the information they need to help with your evaluation and healthcare recommendations.    Effective January 1st, 2014, we ask that you re-schedule your appointment with our physicians should you arrive 10 or more minutes late for your appointment.  We strive to give you quality time with our providers, and arriving late affects you and other patients whose appointments are after yours.    Again, thank you for choosing [facility].  Our hope is that these requests will decrease the amount of time that you wait before being seen by our physicians.       _____________________________________________________________  Should you have questions after your visit to [facility], please contact our office at [phone] between the hours of 8:30 a.m. and 5:00 p.m.  Voicemails left after 4:30 p.m. will not be returned until the following business day.  For prescription refill requests, have your pharmacy contact our office with your prescription refill request.

## 2013-05-26 NOTE — Progress Notes (Signed)
[name] [phone]   Fax:([phone]) [name], MD  [place] [address]  DIAGNOSIS:  1. Stage I (T1 A. N0) adenocarcinoma the left upper lobe 2. Stage I (T1 A. N0 M0) infiltrating ductal carcinoma left breast ER +9% PR +10% 3. New 4 mm right base lung Nodule.  ONCOLOGIC HISTORY:  Per Dr Jaclyn Prime note; Stage I (T1 A. N0) adenocarcinoma the left upper lobe status post resection on 06/01/2005 by Dr. Cleatrice Burke with no evidence for recurrence or new disease since. CT scan of the day was unremarkable    Stage I (T1 A. N0 M0) infiltrating ductal carcinoma left breast ER +9% PR +10% 8 nodes were negative tumor size was 5 mm her to do with HER-2/neu was weakly positive but felt to be negative. She had a grade 1 stage I cancer no LV I. low S-phase fraction 0.7% and a diploid cancer with surgery on 07/03/1998 through 5 years of tamoxifen ending September 2004 no evidence recurrence.  INTERVAL HISTORY:   Lisa [name] 79 y.o. female returns to the clinic today for scheduled followup. Patient had a recent colonoscopy on 03/06/2013 which showed colonic polyp and colonic diverticulosis.  Pathology showed hyperplastic polyp with no adenomatous change or malignancy seen.  Recent yearly CT of the chest is documented below in the radiologic section.  I have personally reviewed these images. The patient tells me that overall she feels well except for arthritis pain in the fingers which is chronic. Her weight is stable and she denies any chest pain or cough.  No hemoptysis. Her last mammogram in November 2013 was read as BI-RADS 1 negative.  MEDICAL HISTORY: Past Medical History  Diagnosis Date  . Hypertension   . Cancer of breast, female   . Cancer of lung   . Hypothyroidism     ALLERGIES:  has No Known Allergies.  MEDICATIONS:  Current Outpatient Prescriptions  Medication Sig Dispense Refill  .  Acetaminophen (TYLENOL ARTHRITIS PAIN PO) Take 1 tablet by mouth as needed (pain).       Marland Kitchen albuterol (PROVENTIL,VENTOLIN) 90 MCG/ACT inhaler Inhale 2 puffs into the lungs every 6 (six) hours as needed for wheezing.      . Alum Hydroxide-Mag Carbonate (GAVISCON PO) Take 1 capsule by mouth as needed (gas relief).       Marland Kitchen aspirin 81 MG tablet Take 81 mg by mouth daily.        Marland Kitchen levothyroxine (SYNTHROID, LEVOTHROID) 100 MCG tablet Take 100 mcg by mouth daily.       . metoprolol succinate (TOPROL-XL) 50 MG 24 hr tablet Take 50 mg by mouth daily. Take with or immediately following a meal.      . telmisartan-hydrochlorothiazide (MICARDIS HCT) 80-12.5 MG per tablet Take 1 tablet by mouth daily.      . traMADol (ULTRAM) 50 MG tablet Take 50 mg by mouth every 4 (four) hours as needed. May take 1-2 tabs as needed        . peg 3350 powder (MOVIPREP) 100 G SOLR Take 1 kit (100 g total) by mouth as directed. PHARMACIST USE THE FOLLOWING FOR PATIENT DISCOUNT BIN: [id number] GROUP: [id number] ID: [id number] PATIENTS WILL SAVE UP TO $10 ON THEIR OUT-OF-POCKET EXPENSE FOR PROCESSING QUESTIONS, CALL [phone]  1 kit  0   No current facility-administered medications for this visit.    SURGICAL HISTORY:  Past Surgical History  Procedure  Laterality Date  . Breast biopsy      left axillary node dissection  . Foot surgery    . Lung removal, partial      upper lobe  . Colonoscopy  03/08/2003    [id number] rectal polyps destroyed with the tip of the snare/Polyps at hepatic flexure, splenic flexure at 35 cm/Left-sided diverticula: unable to retrieve path  . Colonoscopy  09/12/2008    [id number] rectum and distal sigmoid diminutive polyps/scattered left sided diverticulum. hyperplastic  . Colonoscopy N/A 03/06/2013    Procedure: COLONOSCOPY;  Surgeon: Daneil Dolin, MD;  Location: [facility];  Service: Endoscopy;  Laterality: N/A;  8:30     REVIEW OF SYSTEMS:14 point review of system is as in the  history above otherwise negative.  PHYSICAL EXAMINATION:  Blood pressure 125/71, pulse 58, temperature 98.2 F (36.8 C), temperature source Oral, resp. rate 16, weight 216 lb 11.2 oz (98.294 kg). GENERAL: No distress. SKIN:  No rashes or significant lesions  LYMPH: No palpable lymphadenopathy, in the neck, supraclavicular or axillary areas. BREAST:Normal without mass, well-healed scars were noted in the left breast and axilla with some induration around the scar.  No suspicious lesion or evidence of local recurrence was noted. LUNGS: clear to auscultation , no crackles or wheezes HEART: regular rate & rhythm, no murmurs, no gallops, S1 normal and S2 normal and no S3 ABDOMEN: Abdomen soft, non-tender, normal bowel sounds, no masses or organomegaly and no hepatosplenomegaly palpable EXTREMITIES: Trace leg edema bilaterally, no skin discoloration or tenderness. NEURO: Alert & oriented .     LABORATORY DATA: Lab Results  Component Value Date   WBC 5.1 05/19/2012   HGB 13.8 05/19/2012   HCT 42.9 05/19/2012   MCV 90.3 05/19/2012   PLT 285 05/19/2012      Chemistry      Component Value Date/Time   NA 142 05/19/2012 0917   K 4.2 05/19/2012 0917   CL 104 05/19/2012 0917   CO2 31 05/19/2012 0917   BUN 13 05/19/2012 0917   CREATININE 0.91 05/19/2012 0917      Component Value Date/Time   CALCIUM 10.2 05/19/2012 0917   ALKPHOS 83 05/19/2012 0917   AST 16 05/19/2012 0917   ALT 11 05/19/2012 0917   BILITOT 0.3 05/19/2012 0917       RADIOGRAPHIC STUDIES: Ct Chest Wo Contrast  05/23/2013 CT CHEST WITHOUT CONTRAST: .  In the right lung base there is a 4 mm nodule, image 51/series 3.  This appears new from the previous exam.     IMPRESSION:  1.  No acute findings. 2.  Small, nonspecific pulmonary nodule in the right base is new from previous exam.  Attention on follow-up imaging advised. 3.  Prominent coronary artery calcifications.   Original Report Authenticated By: Kerby Moors, M.D.      ASSESSMENT:  1. As regards her breast cancer there is no evidence of disease recurrence at this time. 2. With regard to the lung cancer there does not appear to be any convincing evidence of local recurrence,however patient was noted to have a new 4 mm lung nodule in the right base.  According to NCCN guidelines yearly CT is what is recommended for lesions less than 6 mm.   PLAN:  1. She'll return to clinic in one year. 2. Noncontrast chest CT ordered prior to next visit. 3. Mammogram at this stage is optional.   All questions were satisfactorily answered. Patient knows to call if  any concern arises.  I spent more than 50 % counseling the patient face to face. The total time spent in the appointment was  30 minutes.   Verlan Friends, MD FACP. Hematology/Oncology.

## 2013-06-13 DIAGNOSIS — M2550 Pain in unspecified joint: Secondary | ICD-10-CM | POA: Diagnosis not present

## 2013-07-18 DIAGNOSIS — B351 Tinea unguium: Secondary | ICD-10-CM | POA: Diagnosis not present

## 2013-07-18 DIAGNOSIS — G63 Polyneuropathy in diseases classified elsewhere: Secondary | ICD-10-CM | POA: Diagnosis not present

## 2013-07-18 DIAGNOSIS — M79609 Pain in unspecified limb: Secondary | ICD-10-CM | POA: Diagnosis not present

## 2013-08-17 DIAGNOSIS — Z23 Encounter for immunization: Secondary | ICD-10-CM | POA: Diagnosis not present

## 2013-08-21 ENCOUNTER — Other Ambulatory Visit (HOSPITAL_COMMUNITY): Payer: Self-pay | Admitting: Internal Medicine

## 2013-08-21 DIAGNOSIS — Z139 Encounter for screening, unspecified: Secondary | ICD-10-CM

## 2013-09-14 ENCOUNTER — Ambulatory Visit (HOSPITAL_COMMUNITY)
Admission: RE | Admit: 2013-09-14 | Discharge: 2013-09-14 | Disposition: A | Discharge status: Home / Self Care | Payer: Medicare Other | Source: Ambulatory Visit | Attending: Internal Medicine | Admitting: Internal Medicine

## 2013-09-14 DIAGNOSIS — Z1231 Encounter for screening mammogram for malignant neoplasm of breast: Secondary | ICD-10-CM | POA: Diagnosis not present

## 2013-09-14 DIAGNOSIS — Z139 Encounter for screening, unspecified: Secondary | ICD-10-CM

## 2013-09-20 ENCOUNTER — Encounter: Payer: Self-pay | Admitting: Gastroenterology

## 2013-09-20 ENCOUNTER — Encounter (INDEPENDENT_AMBULATORY_CARE_PROVIDER_SITE_OTHER): Payer: Self-pay

## 2013-09-20 ENCOUNTER — Ambulatory Visit (INDEPENDENT_AMBULATORY_CARE_PROVIDER_SITE_OTHER): Payer: Medicare Other | Admitting: Gastroenterology

## 2013-09-20 VITALS — BP 157/78 | HR 64 | Temp 97.8°F | Ht 67.0 in | Wt 222.2 lb

## 2013-09-20 DIAGNOSIS — Z86010 Personal history of colonic polyps: Secondary | ICD-10-CM

## 2013-09-20 NOTE — Progress Notes (Signed)
Patient did not need appointment today as she has already had her five year f/u colonoscopy earlier this year. Appointment was cancelled. Apologized to patient who voiced understanding. [facility] notified of issue and discussed with patient as well.

## 2013-10-02 DIAGNOSIS — E039 Hypothyroidism, unspecified: Secondary | ICD-10-CM | POA: Diagnosis not present

## 2013-10-02 DIAGNOSIS — E559 Vitamin D deficiency, unspecified: Secondary | ICD-10-CM | POA: Diagnosis not present

## 2013-10-02 DIAGNOSIS — I10 Essential (primary) hypertension: Secondary | ICD-10-CM | POA: Diagnosis not present

## 2013-10-09 DIAGNOSIS — M899 Disorder of bone, unspecified: Secondary | ICD-10-CM | POA: Diagnosis not present

## 2013-10-09 DIAGNOSIS — I10 Essential (primary) hypertension: Secondary | ICD-10-CM | POA: Diagnosis not present

## 2013-10-09 DIAGNOSIS — E039 Hypothyroidism, unspecified: Secondary | ICD-10-CM | POA: Diagnosis not present

## 2013-10-09 DIAGNOSIS — E559 Vitamin D deficiency, unspecified: Secondary | ICD-10-CM | POA: Diagnosis not present

## 2013-11-07 DIAGNOSIS — J069 Acute upper respiratory infection, unspecified: Secondary | ICD-10-CM | POA: Diagnosis not present

## 2013-11-07 DIAGNOSIS — E039 Hypothyroidism, unspecified: Secondary | ICD-10-CM | POA: Diagnosis not present

## 2014-01-04 DIAGNOSIS — N390 Urinary tract infection, site not specified: Secondary | ICD-10-CM | POA: Diagnosis not present

## 2014-01-04 DIAGNOSIS — N3090 Cystitis, unspecified without hematuria: Secondary | ICD-10-CM | POA: Diagnosis not present

## 2014-01-16 DIAGNOSIS — G609 Hereditary and idiopathic neuropathy, unspecified: Secondary | ICD-10-CM | POA: Diagnosis not present

## 2014-01-18 DIAGNOSIS — N390 Urinary tract infection, site not specified: Secondary | ICD-10-CM | POA: Diagnosis not present

## 2014-05-21 ENCOUNTER — Ambulatory Visit (HOSPITAL_COMMUNITY)
Admission: RE | Admit: 2014-05-21 | Discharge: 2014-05-21 | Disposition: A | Discharge status: Home / Self Care | Payer: Medicare Other | Source: Ambulatory Visit | Attending: Hematology and Oncology | Admitting: Hematology and Oncology

## 2014-05-21 ENCOUNTER — Encounter (HOSPITAL_COMMUNITY): Payer: Self-pay

## 2014-05-21 DIAGNOSIS — Z09 Encounter for follow-up examination after completed treatment for conditions other than malignant neoplasm: Secondary | ICD-10-CM | POA: Diagnosis not present

## 2014-05-21 DIAGNOSIS — C50919 Malignant neoplasm of unspecified site of unspecified female breast: Secondary | ICD-10-CM | POA: Diagnosis not present

## 2014-05-21 DIAGNOSIS — E278 Other specified disorders of adrenal gland: Secondary | ICD-10-CM | POA: Insufficient documentation

## 2014-05-21 DIAGNOSIS — R091 Pleurisy: Secondary | ICD-10-CM | POA: Diagnosis not present

## 2014-05-21 DIAGNOSIS — R911 Solitary pulmonary nodule: Secondary | ICD-10-CM | POA: Insufficient documentation

## 2014-05-21 DIAGNOSIS — K449 Diaphragmatic hernia without obstruction or gangrene: Secondary | ICD-10-CM | POA: Diagnosis not present

## 2014-05-21 DIAGNOSIS — I700 Atherosclerosis of aorta: Secondary | ICD-10-CM | POA: Insufficient documentation

## 2014-05-23 ENCOUNTER — Encounter (HOSPITAL_COMMUNITY): Payer: Self-pay

## 2014-05-23 ENCOUNTER — Encounter (HOSPITAL_BASED_OUTPATIENT_CLINIC_OR_DEPARTMENT_OTHER): Payer: Medicare Other

## 2014-05-23 ENCOUNTER — Encounter (HOSPITAL_COMMUNITY): Payer: Medicare Other | Attending: Hematology

## 2014-05-23 VITALS — BP 97/80 | HR 61 | Temp 98.4°F | Resp 18 | Wt 217.0 lb

## 2014-05-23 DIAGNOSIS — R911 Solitary pulmonary nodule: Secondary | ICD-10-CM | POA: Insufficient documentation

## 2014-05-23 DIAGNOSIS — Z85118 Personal history of other malignant neoplasm of bronchus and lung: Secondary | ICD-10-CM

## 2014-05-23 DIAGNOSIS — Z853 Personal history of malignant neoplasm of breast: Secondary | ICD-10-CM | POA: Diagnosis not present

## 2014-05-23 DIAGNOSIS — C50912 Malignant neoplasm of unspecified site of left female breast: Secondary | ICD-10-CM

## 2014-05-23 DIAGNOSIS — Z902 Acquired absence of lung [part of]: Secondary | ICD-10-CM | POA: Diagnosis not present

## 2014-05-23 DIAGNOSIS — I10 Essential (primary) hypertension: Secondary | ICD-10-CM | POA: Insufficient documentation

## 2014-05-23 DIAGNOSIS — E039 Hypothyroidism, unspecified: Secondary | ICD-10-CM | POA: Diagnosis not present

## 2014-05-23 LAB — LACTATE DEHYDROGENASE: LDH: 183 U/L (ref 94–250)

## 2014-05-23 LAB — CBC WITH DIFFERENTIAL/PLATELET
BASOS PCT: 1 % (ref 0–1)
Basophils Absolute: 0 10*3/uL (ref 0.0–0.1)
EOS ABS: 0.1 10*3/uL (ref 0.0–0.7)
Eosinophils Relative: 3 % (ref 0–5)
HCT: 38.7 % (ref 36.0–46.0)
HEMOGLOBIN: 12.5 g/dL (ref 12.0–15.0)
LYMPHS ABS: 2 10*3/uL (ref 0.7–4.0)
Lymphocytes Relative: 51 % — ABNORMAL HIGH (ref 12–46)
MCH: 29.8 pg (ref 26.0–34.0)
MCHC: 32.3 g/dL (ref 30.0–36.0)
MCV: 92.4 fL (ref 78.0–100.0)
MONOS PCT: 10 % (ref 3–12)
Monocytes Absolute: 0.4 10*3/uL (ref 0.1–1.0)
NEUTROS ABS: 1.3 10*3/uL — AB (ref 1.7–7.7)
NEUTROS PCT: 35 % — AB (ref 43–77)
PLATELETS: 244 10*3/uL (ref 150–400)
RBC: 4.19 MIL/uL (ref 3.87–5.11)
RDW: 13.8 % (ref 11.5–15.5)
WBC: 3.8 10*3/uL — ABNORMAL LOW (ref 4.0–10.5)

## 2014-05-23 LAB — COMPREHENSIVE METABOLIC PANEL
ALBUMIN: 3.9 g/dL (ref 3.5–5.2)
ALK PHOS: 73 U/L (ref 39–117)
ALT: 12 U/L (ref 0–35)
AST: 19 U/L (ref 0–37)
Anion gap: 10 (ref 5–15)
BILIRUBIN TOTAL: 0.3 mg/dL (ref 0.3–1.2)
BUN: 21 mg/dL (ref 6–23)
CHLORIDE: 99 meq/L (ref 96–112)
CO2: 32 mEq/L (ref 19–32)
Calcium: 10.2 mg/dL (ref 8.4–10.5)
Creatinine, Ser: 1.13 mg/dL — ABNORMAL HIGH (ref 0.50–1.10)
GFR calc Af Amer: 53 mL/min — ABNORMAL LOW (ref >90)
GFR calc non Af Amer: 45 mL/min — ABNORMAL LOW (ref >90)
Glucose, Bld: 88 mg/dL (ref 70–99)
POTASSIUM: 4.3 meq/L (ref 3.7–5.3)
SODIUM: 141 meq/L (ref 137–147)
TOTAL PROTEIN: 8 g/dL (ref 6.0–8.3)

## 2014-05-23 NOTE — Patient Instructions (Signed)
[place] Discharge Instructions  RECOMMENDATIONS MADE BY THE CONSULTANT AND ANY TEST RESULTS WILL BE SENT TO YOUR REFERRING PHYSICIAN.  EXAM FINDINGS BY THE PHYSICIAN TODAY AND SIGNS OR SYMPTOMS TO REPORT TO CLINIC OR PRIMARY PHYSICIAN: Exam and findings as discussed by Dr. Bubba Hales.  Scans were good.  No evidence of recurrence by exam.  Will check labs today and if there are any concerns we will contact you.   Take vitamin D 1000 units daily. Report any new lumps, bone pain, shortness of breath or other symptoms.   INSTRUCTIONS/FOLLOW-UP: Follow-up in 1 year with CT of chest and office visit.  Thank you for choosing [facility] to provide your oncology and hematology care.  To afford each patient quality time with our providers, please arrive at least 15 minutes before your scheduled appointment time.  With your help, our goal is to use those 15 minutes to complete the necessary work-up to ensure our physicians have the information they need to help with your evaluation and healthcare recommendations.    Effective January 1st, 2014, we ask that you re-schedule your appointment with our physicians should you arrive 10 or more minutes late for your appointment.  We strive to give you quality time with our providers, and arriving late affects you and other patients whose appointments are after yours.    Again, thank you for choosing [facility].  Our hope is that these requests will decrease the amount of time that you wait before being seen by our physicians.       _____________________________________________________________  Should you have questions after your visit to [facility], please contact our office at [phone] between the hours of 8:30 a.m. and 4:30 p.m.  Voicemails left after 4:30 p.m. will not be returned until the following business day.  For prescription refill requests, have your pharmacy contact our office with  your prescription refill request.    _______________________________________________________________  We hope that we have given you very good care.  You may receive a patient satisfaction survey in the mail, please complete it and return it as soon as possible.  We value your feedback!  _______________________________________________________________  Have you asked about our STAR program?  STAR stands for Survivorship Training and Rehabilitation, and this is a nationally recognized cancer care program that focuses on survivorship and rehabilitation.  Cancer and cancer treatments may cause problems, such as, pain, making you feel tired and keeping you from doing the things that you need or want to do. Cancer rehabilitation can help. Our goal is to reduce these troubling effects and help you have the best quality of life possible.  You may receive a survey from a nurse that asks questions about your current state of health.  Based on the survey results, all eligible patients will be referred to the [facility] program for an evaluation so we can better serve you!  A frequently asked questions sheet is available upon request.

## 2014-05-23 NOTE — Progress Notes (Signed)
LABS DRAWN FOR CBCD,CMP,LDH,VD25HY

## 2014-05-24 LAB — VITAMIN D 25 HYDROXY (VIT D DEFICIENCY, FRACTURES): Vit D, 25-Hydroxy: 29 ng/mL — ABNORMAL LOW (ref 30–89)

## 2014-05-25 NOTE — Progress Notes (Signed)
[facility] OFFICE PROGRESS NOTE  PCP [place], Thedore Mins, MD  [place] [address]  REASON FOR VISIT:   1. Stage I (T1 A. N0) adenocarcinoma the left upper lobe  2. Stage I (T1 A. N0 M0) infiltrating ductal carcinoma left breast ER +9% PR +10%  3. 4 mm right base lung Nodule.   HEMATOLOGY/ONCOLOGIC HISTORY:  Per Dr Dr. Rolanda Jay Note. Stage I (T1 A. N0) adenocarcinoma the left upper lobe status post resection on 06/01/2005 by Dr. Cleatrice Burke with no evidence for recurrence or new disease since. Patient has been a non-smoker. CT scan of the Chest discussed below.   Stage I (T1 A. N0 M0) infiltrating ductal carcinoma left breast ER +9% PR +10% 8 nodes were negative tumor size was 5 mm her to do with HER-2/neu was weakly positive but felt to be negative. She had a grade 1 stage I cancer no LV I. low S-phase fraction 0.7% and a diploid cancer with surgery on 07/03/1998 through 5 years of tamoxifen ending September 2004 no evidence recurrence.  CURRENT THERAPY:  Surveillance  MEDICAL HISTORY:  Past Medical History  Diagnosis Date  . Hypertension   . Cancer of breast, female   . Cancer of lung   . Hypothyroidism    has Adenomatous polyps on her problem list.    ALLERGIES:  has No Known Allergies.  MEDICATIONS: has a current medication list which includes the following prescription(s): acetaminophen, albuterol, alum hydroxide-mag carbonate, aspirin, levothyroxine, metoprolol succinate, telmisartan-hydrochlorothiazide, and tramadol.  FAMILY HISTORY: family history includes Cancer in her mother and sister. There is no history of Colon cancer. Unchanged.   REVIEW OF SYSTEMS:   A 10 point review of systems was discussed with the patient and is otherwise noncontributory.    PHYSICAL EXAMINATION:   weight is 217 lb (98.431 kg). Her oral temperature is 98.4 F (36.9 C). Her blood pressure is 97/80 and her pulse is 61. Her respiration is 18  and oxygen saturation is 96%.    GENERAL: alert, no distress and comfortable SKIN: skin color, texture, turgor are normal, no rashes or significant lesions. No cyanosis of digits and no clubbing.  EYES: PERLA; Conjunctiva are pink and non-injected, sclera clear OROPHARYNX: no exudate, no erythema on lips, buccal mucosa, or tongue. NECK: supple, thyroid normal size, non-tender, without nodularity. No masses LYMPH:  no palpable lymphadenopathy in the cervical, axillary or inguinal LUNGS: clear to auscultation and percussion with normal breathing effort,  HEART: regular rate & rhythm and no murmurs.  BREASTS: Unremarkable bilaterally. ABDOMEN: abdomen soft, non-tender and normal bowel sounds, no masses palpated. MUSCULOSKELETAL/EXTREMITIES: no edema NEURO: alert & oriented x 3 with fluent speech, no focal motor/sensory deficits   LABORATORY DATA: Office Visit on 05/23/2014  Component Date Value Ref Range Status  . WBC 05/23/2014 3.8* 4.0 - 10.5 K/uL Final  . RBC 05/23/2014 4.19  3.87 - 5.11 MIL/uL Final  . Hemoglobin 05/23/2014 12.5  12.0 - 15.0 g/dL Final  . HCT 05/23/2014 38.7  36.0 - 46.0 % Final  . MCV 05/23/2014 92.4  78.0 - 100.0 fL Final  . MCH 05/23/2014 29.8  26.0 - 34.0 pg Final  . MCHC 05/23/2014 32.3  30.0 - 36.0 g/dL Final  . RDW 05/23/2014 13.8  11.5 - 15.5 % Final  . Platelets 05/23/2014 244  150 - 400 K/uL Final  . Neutrophils Relative % 05/23/2014 35* 43 - 77 % Final  . Neutro Abs  05/23/2014 1.3* 1.7 - 7.7 K/uL Final  . Lymphocytes Relative 05/23/2014 51* 12 - 46 % Final  . Lymphs Abs 05/23/2014 2.0  0.7 - 4.0 K/uL Final  . Monocytes Relative 05/23/2014 10  3 - 12 % Final  . Monocytes Absolute 05/23/2014 0.4  0.1 - 1.0 K/uL Final  . Eosinophils Relative 05/23/2014 3  0 - 5 % Final  . Eosinophils Absolute 05/23/2014 0.1  0.0 - 0.7 K/uL Final  . Basophils Relative 05/23/2014 1  0 - 1 % Final  . Basophils Absolute 05/23/2014 0.0  0.0 - 0.1 K/uL Final  . Sodium  05/23/2014 141  137 - 147 mEq/L Final  . Potassium 05/23/2014 4.3  3.7 - 5.3 mEq/L Final  . Chloride 05/23/2014 99  96 - 112 mEq/L Final  . CO2 05/23/2014 32  19 - 32 mEq/L Final  . Glucose, Bld 05/23/2014 88  70 - 99 mg/dL Final  . BUN 05/23/2014 21  6 - 23 mg/dL Final  . Creatinine, Ser 05/23/2014 1.13* 0.50 - 1.10 mg/dL Final  . Calcium 05/23/2014 10.2  8.4 - 10.5 mg/dL Final  . Total Protein 05/23/2014 8.0  6.0 - 8.3 g/dL Final  . Albumin 05/23/2014 3.9  3.5 - 5.2 g/dL Final  . AST 05/23/2014 19  0 - 37 U/L Final  . ALT 05/23/2014 12  0 - 35 U/L Final  . Alkaline Phosphatase 05/23/2014 73  39 - 117 U/L Final  . Total Bilirubin 05/23/2014 0.3  0.3 - 1.2 mg/dL Final  . GFR calc non Af Amer 05/23/2014 45* >90 mL/min Final  . GFR calc Af Amer 05/23/2014 53* >90 mL/min Final   Comment: (NOTE)                          The eGFR has been calculated using the CKD EPI equation.                          This calculation has not been validated in all clinical situations.                          eGFR's persistently <90 mL/min signify possible Chronic Kidney                          Disease.  . Anion gap 05/23/2014 10  5 - 15 Final  . LDH 05/23/2014 183  94 - 250 U/L Final  . Vit D, 25-Hydroxy 05/23/2014 29* 30 - 89 ng/mL Final   Comment: (NOTE)                          This assay accurately quantifies Vitamin D, which is the sum of the                          25-Hydroxy forms of Vitamin D2 and D3.  Studies have shown that the                          optimum concentration of 25-Hydroxy Vitamin D is 30 ng/mL or higher.                           Concentrations of Vitamin D between 20 and 29  ng/mL are considered to                          be insufficient and concentrations less than 20 ng/mL are considered                          to be deficient for Vitamin D.                          Performed at [facility]   Thyroid function normal in January.  RADIOGRAPHIC STUDIES: Ct Chest  Wo Contrast  05/21/2014   CLINICAL DATA:  Lung nodule follow-up, breast cancer  EXAM: CT CHEST WITHOUT CONTRAST  TECHNIQUE: Multidetector CT imaging of the chest was performed following the standard protocol without IV contrast.  COMPARISON:  05/23/2013  FINDINGS: Atherosclerotic calcifications of thoracic aorta again noted. Atherosclerotic calcifications of coronary arteries. The visualized upper abdomen shows stable left adrenal nodule. Small hiatal hernia again noted.  Central airways are patent. No mediastinal hematoma or adenopathy. Air is noted in distal esophagus. Heart size is within normal limits. No pericardial effusion. Again noted left anterior pleural thickening stable from prior exam. Surgical clip in left mediastinum again noted. Stable elongated linear scarring in left upper lobe.  Sagittal images of the spine shows no destructive bony lesions. Mild degenerative changes thoracic spine.  No hilar or mediastinal adenopathy.  No axillary adenopathy.  Axial image 37 there is 4 mm nodule right base anteriorly stable in size in appearance from prior exam. Tiny 3 mm nodule right base anteriorly axial image 38 stable in size in appearance from prior exam. No new pulmonary nodules are noted.  Images of the lung parenchyma shows no acute infiltrate or pulmonary edema.  IMPRESSION: 1. Stable tiny nodules in right base anteriorly. No new pulmonary nodules are noted. 2. Atherosclerotic calcifications of thoracic aorta and coronary arteries. Stable left adrenal nodule. Small hiatal hernia again noted. 3. No acute infiltrate or pulmonary edema. Stable scarring in left upper lobe. Stable focal anterior pleural thickening left lower lobe anteriorly.   Electronically Signed   By: Lahoma Crocker M.D.   On: 05/21/2014 10:47    ASSESSMENT:  1. Breast Cancer is without evidence of disease at this time. Five years of adjuvant tamoxifen completed in 2004 2. Stage I Adenocarcinoma of the left upper lobe resected in  2006 3. Small lung nodule in the right base described as stable with CT scan.      RECOMMENDATIONS:  1.Annual mammogram in October. 2. Followup in 1 year.     The patient knows to call the clinic with any problems, questions or concerns. We can certainly see the patient sooner if necessary.    Darrall Dears, MD 05/25/2014 5:06 PM

## 2014-07-10 DIAGNOSIS — M79609 Pain in unspecified limb: Secondary | ICD-10-CM | POA: Diagnosis not present

## 2014-07-10 DIAGNOSIS — G609 Hereditary and idiopathic neuropathy, unspecified: Secondary | ICD-10-CM | POA: Diagnosis not present

## 2014-07-11 DIAGNOSIS — I10 Essential (primary) hypertension: Secondary | ICD-10-CM | POA: Diagnosis not present

## 2014-07-11 DIAGNOSIS — IMO0001 Reserved for inherently not codable concepts without codable children: Secondary | ICD-10-CM | POA: Diagnosis not present

## 2014-07-11 DIAGNOSIS — M2550 Pain in unspecified joint: Secondary | ICD-10-CM | POA: Diagnosis not present

## 2014-08-08 DIAGNOSIS — G620 Drug-induced polyneuropathy: Secondary | ICD-10-CM | POA: Diagnosis not present

## 2014-08-08 DIAGNOSIS — I10 Essential (primary) hypertension: Secondary | ICD-10-CM | POA: Diagnosis not present

## 2014-08-08 DIAGNOSIS — Z23 Encounter for immunization: Secondary | ICD-10-CM | POA: Diagnosis not present

## 2014-08-08 DIAGNOSIS — E039 Hypothyroidism, unspecified: Secondary | ICD-10-CM | POA: Diagnosis not present

## 2014-09-10 DIAGNOSIS — N189 Chronic kidney disease, unspecified: Secondary | ICD-10-CM | POA: Diagnosis not present

## 2014-09-10 DIAGNOSIS — E039 Hypothyroidism, unspecified: Secondary | ICD-10-CM | POA: Diagnosis not present

## 2014-09-10 DIAGNOSIS — R5383 Other fatigue: Secondary | ICD-10-CM | POA: Diagnosis not present

## 2014-09-17 ENCOUNTER — Other Ambulatory Visit (HOSPITAL_COMMUNITY): Payer: Self-pay | Admitting: Internal Medicine

## 2014-09-17 DIAGNOSIS — Z1231 Encounter for screening mammogram for malignant neoplasm of breast: Secondary | ICD-10-CM

## 2014-09-21 ENCOUNTER — Ambulatory Visit (HOSPITAL_COMMUNITY)
Admission: RE | Admit: 2014-09-21 | Discharge: 2014-09-21 | Disposition: A | Discharge status: Home / Self Care | Payer: Medicare Other | Source: Ambulatory Visit | Attending: Internal Medicine | Admitting: Internal Medicine

## 2014-09-21 DIAGNOSIS — Z1231 Encounter for screening mammogram for malignant neoplasm of breast: Secondary | ICD-10-CM | POA: Insufficient documentation

## 2014-10-01 DIAGNOSIS — I10 Essential (primary) hypertension: Secondary | ICD-10-CM | POA: Diagnosis not present

## 2014-10-01 DIAGNOSIS — E039 Hypothyroidism, unspecified: Secondary | ICD-10-CM | POA: Diagnosis not present

## 2014-10-09 DIAGNOSIS — M79673 Pain in unspecified foot: Secondary | ICD-10-CM | POA: Diagnosis not present

## 2014-10-09 DIAGNOSIS — G609 Hereditary and idiopathic neuropathy, unspecified: Secondary | ICD-10-CM | POA: Diagnosis not present

## 2014-11-27 DIAGNOSIS — H5203 Hypermetropia, bilateral: Secondary | ICD-10-CM | POA: Diagnosis not present

## 2014-11-27 DIAGNOSIS — H43812 Vitreous degeneration, left eye: Secondary | ICD-10-CM | POA: Diagnosis not present

## 2014-11-27 DIAGNOSIS — Z9849 Cataract extraction status, unspecified eye: Secondary | ICD-10-CM | POA: Diagnosis not present

## 2014-11-27 DIAGNOSIS — Z961 Presence of intraocular lens: Secondary | ICD-10-CM | POA: Diagnosis not present

## 2014-12-05 DIAGNOSIS — E039 Hypothyroidism, unspecified: Secondary | ICD-10-CM | POA: Diagnosis not present

## 2014-12-12 DIAGNOSIS — E039 Hypothyroidism, unspecified: Secondary | ICD-10-CM | POA: Diagnosis not present

## 2014-12-12 DIAGNOSIS — G6282 Radiation-induced polyneuropathy: Secondary | ICD-10-CM | POA: Diagnosis not present

## 2014-12-12 DIAGNOSIS — Z6836 Body mass index (BMI) 36.0-36.9, adult: Secondary | ICD-10-CM | POA: Diagnosis not present

## 2014-12-12 DIAGNOSIS — R202 Paresthesia of skin: Secondary | ICD-10-CM | POA: Diagnosis not present

## 2015-01-08 DIAGNOSIS — M79673 Pain in unspecified foot: Secondary | ICD-10-CM | POA: Diagnosis not present

## 2015-01-08 DIAGNOSIS — G609 Hereditary and idiopathic neuropathy, unspecified: Secondary | ICD-10-CM | POA: Diagnosis not present

## 2015-01-08 DIAGNOSIS — L851 Acquired keratosis [keratoderma] palmaris et plantaris: Secondary | ICD-10-CM | POA: Diagnosis not present

## 2015-01-30 DIAGNOSIS — E039 Hypothyroidism, unspecified: Secondary | ICD-10-CM | POA: Diagnosis not present

## 2015-03-18 DIAGNOSIS — E039 Hypothyroidism, unspecified: Secondary | ICD-10-CM | POA: Diagnosis not present

## 2015-04-09 DIAGNOSIS — G609 Hereditary and idiopathic neuropathy, unspecified: Secondary | ICD-10-CM | POA: Diagnosis not present

## 2015-04-09 DIAGNOSIS — M79673 Pain in unspecified foot: Secondary | ICD-10-CM | POA: Diagnosis not present

## 2015-04-11 DIAGNOSIS — E039 Hypothyroidism, unspecified: Secondary | ICD-10-CM | POA: Diagnosis not present

## 2015-04-17 DIAGNOSIS — G589 Mononeuropathy, unspecified: Secondary | ICD-10-CM | POA: Diagnosis not present

## 2015-04-17 DIAGNOSIS — D649 Anemia, unspecified: Secondary | ICD-10-CM | POA: Diagnosis not present

## 2015-04-17 DIAGNOSIS — E782 Mixed hyperlipidemia: Secondary | ICD-10-CM | POA: Diagnosis not present

## 2015-04-17 DIAGNOSIS — J45909 Unspecified asthma, uncomplicated: Secondary | ICD-10-CM | POA: Diagnosis not present

## 2015-04-17 DIAGNOSIS — R944 Abnormal results of kidney function studies: Secondary | ICD-10-CM | POA: Diagnosis not present

## 2015-04-17 DIAGNOSIS — E039 Hypothyroidism, unspecified: Secondary | ICD-10-CM | POA: Diagnosis not present

## 2015-04-17 DIAGNOSIS — I10 Essential (primary) hypertension: Secondary | ICD-10-CM | POA: Diagnosis not present

## 2015-05-23 DIAGNOSIS — E039 Hypothyroidism, unspecified: Secondary | ICD-10-CM | POA: Diagnosis not present

## 2015-05-24 ENCOUNTER — Ambulatory Visit (HOSPITAL_COMMUNITY): Admission: RE | Admit: 2015-05-24 | Payer: Medicare Other | Source: Ambulatory Visit

## 2015-05-27 ENCOUNTER — Ambulatory Visit (HOSPITAL_COMMUNITY): Payer: Medicare Other | Admitting: Hematology & Oncology

## 2015-05-27 NOTE — Progress Notes (Signed)
This encounter was created in error - please disregard.

## 2015-06-10 ENCOUNTER — Encounter (HOSPITAL_COMMUNITY): Payer: Medicare Other | Attending: Hematology & Oncology | Admitting: Hematology & Oncology

## 2015-06-10 ENCOUNTER — Encounter (HOSPITAL_COMMUNITY): Payer: Self-pay | Admitting: Hematology & Oncology

## 2015-06-10 VITALS — BP 132/61 | HR 65 | Temp 98.3°F | Resp 22 | Wt 223.6 lb

## 2015-06-10 DIAGNOSIS — Z853 Personal history of malignant neoplasm of breast: Secondary | ICD-10-CM

## 2015-06-10 DIAGNOSIS — Z85118 Personal history of other malignant neoplasm of bronchus and lung: Secondary | ICD-10-CM | POA: Diagnosis not present

## 2015-06-10 DIAGNOSIS — R918 Other nonspecific abnormal finding of lung field: Secondary | ICD-10-CM | POA: Diagnosis not present

## 2015-06-10 DIAGNOSIS — Z1239 Encounter for other screening for malignant neoplasm of breast: Secondary | ICD-10-CM

## 2015-06-10 NOTE — Progress Notes (Signed)
[place] at [facility] Progress Note  Patient Care Team: Delphina Cahill, MD as PCP - General (Internal Medicine)  CHIEF COMPLAINTS:   1. Stage I (T1 A. N0) adenocarcinoma the left upper lobe, resection 06/01/2005 2. Stage I (T1 A. N0 M0) infiltrating ductal carcinoma left breast ER +9% PR +10% (07/03/98) 5 years of Tamoxifen 3. Abnormal CT imaging of the chest  HISTORY OF PRESENTING ILLNESS:  Lisa [name] 81 y.o. female is here because of a history of lung cancer and breast cancer.  She is here alone today and she is feeling well. She lives with one of her daughters.  She has cramping and tingling in her legs. She has been diagnosed with neuropathy. She was given medication to help, they recently upped the dose. She notes that some nights the cramping causes her so much discomfort she cannot go to sleep. She has had no breast pain. She self checks her breasts. She denies cough, fever, or SOB. Her appetite is good. She spends her time reading a lot and does chores around the house.  She is due for a mammogram in November. She is up to date on her colonoscopies.  MEDICAL HISTORY:  Past Medical History  Diagnosis Date  . Hypertension   . Cancer of breast, female   . Cancer of lung   . Hypothyroidism     SURGICAL HISTORY: Past Surgical History  Procedure Laterality Date  . Breast biopsy      left axillary node dissection  . Foot surgery    . Lung removal, partial      upper lobe  . Colonoscopy  03/08/2003    WPY:[id number] rectal polyps destroyed with the tip of the snare/Polyps at hepatic flexure, splenic flexure at 35 cm/Left-sided diverticula: unable to retrieve path  . Colonoscopy  09/12/2008    NLZ:JQBHAL rectum and distal sigmoid diminutive polyps/scattered left sided diverticulum. hyperplastic  . Colonoscopy N/A 03/06/2013    PFX:TKWIOXB polyp-removed as described above; colonic diverticulosis. hyperplastic polyps. next TCS 03/2018    SOCIAL  HISTORY: History   Social History  . Marital Status: Widowed    Spouse Name: N/A  . Number of Children: N/A  . Years of Education: N/A   Occupational History  . Not on file.   Social History Main Topics  . Smoking status: Former Smoker -- 0.75 packs/day for 14 years  . Smokeless tobacco: Never Used     Comment: smoke-free X 30 yeras  . Alcohol Use: No  . Drug Use: No  . Sexual Activity: Not on file   Other Topics Concern  . Not on file   Social History Narrative  She was born in [place]. Widowed. Husband died 4 years ago. She lives with her daughter. 3 children. 2 sons, and 1 daughter. 1 son lives in [place] and the other in [place]. 6 grandchildren. She reads a lot.   FAMILY HISTORY: Family History  Problem Relation Age of Onset  . Cancer Mother   . Cancer Sister   . Colon cancer Neg Hx    indicated that her mother is deceased. She indicated that her father is deceased. She indicated that her sister is deceased.   ALLERGIES:  has No Known Allergies.  MEDICATIONS:  Current Outpatient Prescriptions  Medication Sig Dispense Refill  . Acetaminophen (TYLENOL ARTHRITIS PAIN PO) Take 1 tablet by mouth as needed (pain).     Marland Kitchen albuterol (PROVENTIL,VENTOLIN) 90 MCG/ACT inhaler Inhale 2 puffs into the lungs every 6 (six)  hours as needed for wheezing.    . Alum Hydroxide-Mag Carbonate (GAVISCON PO) Take 1 capsule by mouth as needed (gas relief).     Marland Kitchen aspirin 81 MG tablet Take 81 mg by mouth daily.      Marland Kitchen levothyroxine (SYNTHROID, LEVOTHROID) 100 MCG tablet Take 100 mcg by mouth daily.     . metoprolol succinate (TOPROL-XL) 50 MG 24 hr tablet Take 50 mg by mouth daily. Take with or immediately following a meal.    . telmisartan-hydrochlorothiazide (MICARDIS HCT) 80-12.5 MG per tablet Take 1 tablet by mouth daily.    . traMADol (ULTRAM) 50 MG tablet Take 50 mg by mouth every 4 (four) hours as needed. May take 1-2 tabs as needed       No current facility-administered  medications for this visit.    Review of Systems  Constitutional: Positive for malaise/fatigue.       Sleep loss due to leg discomfort.  Musculoskeletal:       Cramping and tingling in her legs.  All other systems reviewed and are negative.  14 point ROS was done and is otherwise as detailed above or in HPI   PHYSICAL EXAMINATION: ECOG PERFORMANCE STATUS: 0 - Asymptomatic  Filed Vitals:   06/10/15 1100  BP: 132/61  Pulse: 65  Temp: 98.3 F (36.8 C)  Resp: 22   Filed Weights   06/10/15 1100  Weight: 223 lb 9.6 oz (101.424 kg)     Physical Exam  Constitutional: She is oriented to person, place, and time and well-developed, well-nourished, and in no distress.  HENT:  Head: Normocephalic and atraumatic.  Nose: Nose normal.  Mouth/Throat: Oropharynx is clear and moist. No oropharyngeal exudate.  Eyes: Conjunctivae and EOM are normal. Pupils are equal, round, and reactive to light. Right eye exhibits no discharge. Left eye exhibits no discharge. No scleral icterus.  Neck: Normal range of motion. Neck supple. No tracheal deviation present. No thyromegaly present.  Cardiovascular: Normal rate, regular rhythm and normal heart sounds.  Exam reveals no gallop and no friction rub.   No murmur heard. Pulmonary/Chest: Effort normal and breath sounds normal. She has no wheezes. She has no rales.    Abdominal: Soft. Bowel sounds are normal. She exhibits no distension and no mass. There is no tenderness. There is no rebound and no guarding.  Musculoskeletal: Normal range of motion. She exhibits no edema.  Lymphadenopathy:    She has no cervical adenopathy.  Neurological: She is alert and oriented to person, place, and time. She has normal reflexes. No cranial nerve deficit. Gait normal. Coordination normal.  Skin: Skin is warm and dry. No rash noted.  Psychiatric: Mood, memory, affect and judgment normal.  Nursing note and vitals reviewed.   LABORATORY DATA:  I have reviewed the  data as listed Lab Results  Component Value Date   WBC 3.8* 05/23/2014   HGB 12.5 05/23/2014   HCT 38.7 05/23/2014   MCV 92.4 05/23/2014   PLT 244 05/23/2014    RADIOGRAPHIC STUDIES: I have personally reviewed the radiological images as listed and agreed with the findings in the report.  CLINICAL DATA: Screening.  EXAM: DIGITAL SCREENING BILATERAL MAMMOGRAM WITH CAD  COMPARISON: Previous exam(s).  ACR Breast Density Category b: There are scattered areas of fibroglandular density.  FINDINGS: There are no findings suspicious for malignancy. Images were processed with CAD.  IMPRESSION: No mammographic evidence of malignancy. A result letter of this screening mammogram will be mailed directly to the patient.  RECOMMENDATION:  Screening mammogram in one year. (Code:SM-B-01Y)  BI-RADS CATEGORY 1: Negative.   Electronically Signed  By: Lovey Newcomer M.D.  On: 09/24/2014 09:03     CLINICAL DATA: Lung nodule follow-up, breast cancer  EXAM: CT CHEST WITHOUT CONTRAST  TECHNIQUE: Multidetector CT imaging of the chest was performed following the standard protocol without IV contrast.  COMPARISON: 05/23/2013    IMPRESSION: 1. Stable tiny nodules in right base anteriorly. No new pulmonary nodules are noted. 2. Atherosclerotic calcifications of thoracic aorta and coronary arteries. Stable left adrenal nodule. Small hiatal hernia again noted. 3. No acute infiltrate or pulmonary edema. Stable scarring in left upper lobe. Stable focal anterior pleural thickening left lower lobe anteriorly.   Electronically Signed  By: Lahoma Crocker M.D.  On: 05/21/2014 10:47   ASSESSMENT & PLAN:   1. Stage I (T1 A. N0) adenocarcinoma the left upper lobe, resection 06/01/2005 2. Stage I (T1 A. N0 M0) infiltrating ductal carcinoma left breast ER +9% PR +10% (07/03/98) 5 years of Tamoxifen 3. Abnormal CT imaging of the chest  She is doing well without obvious  recurrence.  We reviewed her prior CT scans in detail and will proceed with one additional study.  I advised her that if the abnormal findings in the chest and adrenal gland are stable, these findings are most likely benign.  I have ordered for her routine mammogram in November.  We will follow up in one year.  Orders Placed This Encounter  Procedures  . MM SCREENING BREAST TOMO BILATERAL    Standing Status: Future     Number of Occurrences:      Standing Expiration Date: 08/09/2016    Order Specific Question:  Reason for Exam (SYMPTOM  OR DIAGNOSIS REQUIRED)    Answer:  history breast cancer, screening    Order Specific Question:  Preferred imaging location?    Answer:  [facility]    All questions were answered. The patient knows to call the clinic with any problems, questions or concerns.  This note was electronically signed.   This document serves as a record of services personally performed by Ancil Linsey, MD. It was created on her behalf by Arlyce Harman, a trained medical scribe. The creation of this record is based on the scribe's personal observations and the provider's statements to them. This document has been checked and approved by the attending provider.  I have reviewed the above documentation for accuracy and completeness, and I agree with the above.   Molli Hazard, MD  06/10/2015 5:58 PM

## 2015-06-10 NOTE — Patient Instructions (Addendum)
[place] at [facility] Discharge Instructions  RECOMMENDATIONS MADE BY THE CONSULTANT AND ANY TEST RESULTS WILL BE SENT TO YOUR REFERRING PHYSICIAN.  Exam completed by Dr Whitney Muse today Return in to see the doctor in one year with lab work We are going to schedule a mammogram for you. Please call the clinic if you have any questions or concerns  Thank you for choosing [place] at [facility] to provide your oncology and hematology care.  To afford each patient quality time with our provider, please arrive at least 15 minutes before your scheduled appointment time.    You need to re-schedule your appointment should you arrive 10 or more minutes late.  We strive to give you quality time with our providers, and arriving late affects you and other patients whose appointments are after yours.  Also, if you no show three or more times for appointments you may be dismissed from the clinic at the providers discretion.     Again, thank you for choosing [facility].  Our hope is that these requests will decrease the amount of time that you wait before being seen by our physicians.       _____________________________________________________________  Should you have questions after your visit to [facility], please contact our office at [phone] between the hours of 8:30 a.m. and 4:30 p.m.  Voicemails left after 4:30 p.m. will not be returned until the following business day.  For prescription refill requests, have your pharmacy contact our office.

## 2015-08-12 DIAGNOSIS — Z23 Encounter for immunization: Secondary | ICD-10-CM | POA: Diagnosis not present

## 2015-09-03 ENCOUNTER — Other Ambulatory Visit (HOSPITAL_COMMUNITY): Payer: Self-pay | Admitting: Internal Medicine

## 2015-09-03 DIAGNOSIS — Z1231 Encounter for screening mammogram for malignant neoplasm of breast: Secondary | ICD-10-CM

## 2015-09-23 ENCOUNTER — Ambulatory Visit (HOSPITAL_COMMUNITY)
Admission: RE | Admit: 2015-09-23 | Discharge: 2015-09-23 | Disposition: A | Discharge status: Home / Self Care | Payer: Medicare Other | Source: Ambulatory Visit | Attending: Internal Medicine | Admitting: Internal Medicine

## 2015-09-23 DIAGNOSIS — Z1231 Encounter for screening mammogram for malignant neoplasm of breast: Secondary | ICD-10-CM | POA: Insufficient documentation

## 2015-09-24 DIAGNOSIS — J00 Acute nasopharyngitis [common cold]: Secondary | ICD-10-CM | POA: Diagnosis not present

## 2015-09-24 DIAGNOSIS — R0902 Hypoxemia: Secondary | ICD-10-CM | POA: Diagnosis not present

## 2015-09-25 ENCOUNTER — Ambulatory Visit (HOSPITAL_COMMUNITY): Payer: Medicare Other

## 2015-10-15 DIAGNOSIS — E039 Hypothyroidism, unspecified: Secondary | ICD-10-CM | POA: Diagnosis not present

## 2015-10-15 DIAGNOSIS — E782 Mixed hyperlipidemia: Secondary | ICD-10-CM | POA: Diagnosis not present

## 2015-10-15 DIAGNOSIS — I10 Essential (primary) hypertension: Secondary | ICD-10-CM | POA: Diagnosis not present

## 2015-10-17 DIAGNOSIS — C3490 Malignant neoplasm of unspecified part of unspecified bronchus or lung: Secondary | ICD-10-CM | POA: Diagnosis not present

## 2015-10-17 DIAGNOSIS — D509 Iron deficiency anemia, unspecified: Secondary | ICD-10-CM | POA: Diagnosis not present

## 2015-10-17 DIAGNOSIS — I10 Essential (primary) hypertension: Secondary | ICD-10-CM | POA: Diagnosis not present

## 2015-10-17 DIAGNOSIS — R944 Abnormal results of kidney function studies: Secondary | ICD-10-CM | POA: Diagnosis not present

## 2015-10-17 DIAGNOSIS — G589 Mononeuropathy, unspecified: Secondary | ICD-10-CM | POA: Diagnosis not present

## 2015-10-17 DIAGNOSIS — E039 Hypothyroidism, unspecified: Secondary | ICD-10-CM | POA: Diagnosis not present

## 2015-10-17 DIAGNOSIS — J45909 Unspecified asthma, uncomplicated: Secondary | ICD-10-CM | POA: Diagnosis not present

## 2015-10-17 DIAGNOSIS — E782 Mixed hyperlipidemia: Secondary | ICD-10-CM | POA: Diagnosis not present

## 2015-12-27 DIAGNOSIS — M25571 Pain in right ankle and joints of right foot: Secondary | ICD-10-CM | POA: Diagnosis not present

## 2015-12-27 DIAGNOSIS — R600 Localized edema: Secondary | ICD-10-CM | POA: Diagnosis not present

## 2016-01-01 DIAGNOSIS — R600 Localized edema: Secondary | ICD-10-CM | POA: Diagnosis not present

## 2016-01-01 DIAGNOSIS — M25571 Pain in right ankle and joints of right foot: Secondary | ICD-10-CM | POA: Diagnosis not present

## 2016-04-07 DIAGNOSIS — G609 Hereditary and idiopathic neuropathy, unspecified: Secondary | ICD-10-CM | POA: Diagnosis not present

## 2016-04-07 DIAGNOSIS — M79673 Pain in unspecified foot: Secondary | ICD-10-CM | POA: Diagnosis not present

## 2016-04-16 DIAGNOSIS — E782 Mixed hyperlipidemia: Secondary | ICD-10-CM | POA: Diagnosis not present

## 2016-04-16 DIAGNOSIS — E039 Hypothyroidism, unspecified: Secondary | ICD-10-CM | POA: Diagnosis not present

## 2016-04-16 DIAGNOSIS — I10 Essential (primary) hypertension: Secondary | ICD-10-CM | POA: Diagnosis not present

## 2016-04-20 DIAGNOSIS — I10 Essential (primary) hypertension: Secondary | ICD-10-CM | POA: Diagnosis not present

## 2016-04-20 DIAGNOSIS — E039 Hypothyroidism, unspecified: Secondary | ICD-10-CM | POA: Diagnosis not present

## 2016-04-20 DIAGNOSIS — D509 Iron deficiency anemia, unspecified: Secondary | ICD-10-CM | POA: Diagnosis not present

## 2016-04-20 DIAGNOSIS — E782 Mixed hyperlipidemia: Secondary | ICD-10-CM | POA: Diagnosis not present

## 2016-04-20 DIAGNOSIS — R944 Abnormal results of kidney function studies: Secondary | ICD-10-CM | POA: Diagnosis not present

## 2016-04-20 DIAGNOSIS — J45909 Unspecified asthma, uncomplicated: Secondary | ICD-10-CM | POA: Diagnosis not present

## 2016-05-29 DIAGNOSIS — D529 Folate deficiency anemia, unspecified: Secondary | ICD-10-CM | POA: Diagnosis not present

## 2016-05-29 DIAGNOSIS — E782 Mixed hyperlipidemia: Secondary | ICD-10-CM | POA: Diagnosis not present

## 2016-05-29 DIAGNOSIS — D519 Vitamin B12 deficiency anemia, unspecified: Secondary | ICD-10-CM | POA: Diagnosis not present

## 2016-05-29 DIAGNOSIS — R7889 Finding of other specified substances, not normally found in blood: Secondary | ICD-10-CM | POA: Diagnosis not present

## 2016-06-02 DIAGNOSIS — E039 Hypothyroidism, unspecified: Secondary | ICD-10-CM | POA: Diagnosis not present

## 2016-06-02 DIAGNOSIS — D500 Iron deficiency anemia secondary to blood loss (chronic): Secondary | ICD-10-CM | POA: Diagnosis not present

## 2016-06-02 DIAGNOSIS — I10 Essential (primary) hypertension: Secondary | ICD-10-CM | POA: Diagnosis not present

## 2016-06-08 ENCOUNTER — Other Ambulatory Visit (HOSPITAL_COMMUNITY): Payer: Self-pay

## 2016-06-08 DIAGNOSIS — D369 Benign neoplasm, unspecified site: Secondary | ICD-10-CM

## 2016-06-09 ENCOUNTER — Other Ambulatory Visit (HOSPITAL_COMMUNITY): Payer: Self-pay | Admitting: Hematology & Oncology

## 2016-06-09 ENCOUNTER — Encounter (HOSPITAL_COMMUNITY): Payer: Medicare Other | Attending: Oncology | Admitting: Oncology

## 2016-06-09 ENCOUNTER — Encounter (HOSPITAL_COMMUNITY): Payer: Self-pay | Admitting: Oncology

## 2016-06-09 ENCOUNTER — Encounter (HOSPITAL_COMMUNITY): Payer: Medicare Other

## 2016-06-09 DIAGNOSIS — Z853 Personal history of malignant neoplasm of breast: Secondary | ICD-10-CM

## 2016-06-09 DIAGNOSIS — Z85118 Personal history of other malignant neoplasm of bronchus and lung: Secondary | ICD-10-CM

## 2016-06-09 DIAGNOSIS — C50912 Malignant neoplasm of unspecified site of left female breast: Secondary | ICD-10-CM

## 2016-06-09 DIAGNOSIS — Z1239 Encounter for other screening for malignant neoplasm of breast: Secondary | ICD-10-CM

## 2016-06-09 DIAGNOSIS — D709 Neutropenia, unspecified: Secondary | ICD-10-CM

## 2016-06-09 DIAGNOSIS — D369 Benign neoplasm, unspecified site: Secondary | ICD-10-CM | POA: Insufficient documentation

## 2016-06-09 DIAGNOSIS — C50919 Malignant neoplasm of unspecified site of unspecified female breast: Secondary | ICD-10-CM | POA: Insufficient documentation

## 2016-06-09 DIAGNOSIS — R918 Other nonspecific abnormal finding of lung field: Secondary | ICD-10-CM

## 2016-06-09 DIAGNOSIS — C3492 Malignant neoplasm of unspecified part of left bronchus or lung: Secondary | ICD-10-CM | POA: Insufficient documentation

## 2016-06-09 HISTORY — DX: Neutropenia, unspecified: D70.9

## 2016-06-09 LAB — COMPREHENSIVE METABOLIC PANEL
ALBUMIN: 3.9 g/dL (ref 3.5–5.0)
ALK PHOS: 51 U/L (ref 38–126)
ALT: 12 U/L — ABNORMAL LOW (ref 14–54)
AST: 20 U/L (ref 15–41)
Anion gap: 5 (ref 5–15)
BILIRUBIN TOTAL: 0.7 mg/dL (ref 0.3–1.2)
BUN: 17 mg/dL (ref 6–20)
CALCIUM: 8.8 mg/dL — AB (ref 8.9–10.3)
CO2: 29 mmol/L (ref 22–32)
Chloride: 102 mmol/L (ref 101–111)
Creatinine, Ser: 1.08 mg/dL — ABNORMAL HIGH (ref 0.44–1.00)
GFR calc Af Amer: 55 mL/min — ABNORMAL LOW (ref >60)
GFR, EST NON AFRICAN AMERICAN: 47 mL/min — AB (ref >60)
GLUCOSE: 96 mg/dL (ref 65–99)
Potassium: 3.8 mmol/L (ref 3.5–5.1)
Sodium: 136 mmol/L (ref 135–145)
TOTAL PROTEIN: 8 g/dL (ref 6.5–8.1)

## 2016-06-09 LAB — CBC WITH DIFFERENTIAL/PLATELET
BASOS ABS: 0 10*3/uL (ref 0.0–0.1)
BASOS PCT: 0 %
Eosinophils Absolute: 0.1 10*3/uL (ref 0.0–0.7)
Eosinophils Relative: 2 %
HEMATOCRIT: 38 % (ref 36.0–46.0)
HEMOGLOBIN: 12 g/dL (ref 12.0–15.0)
LYMPHS PCT: 65 %
Lymphs Abs: 2.1 10*3/uL (ref 0.7–4.0)
MCH: 30.9 pg (ref 26.0–34.0)
MCHC: 31.6 g/dL (ref 30.0–36.0)
MCV: 97.9 fL (ref 78.0–100.0)
MONO ABS: 0.5 10*3/uL (ref 0.1–1.0)
MONOS PCT: 17 %
NEUTROS ABS: 0.5 10*3/uL — AB (ref 1.7–7.7)
NEUTROS PCT: 17 %
Platelets: 183 10*3/uL (ref 150–400)
RBC: 3.88 MIL/uL (ref 3.87–5.11)
RDW: 14.3 % (ref 11.5–15.5)
WBC: 3.2 10*3/uL — ABNORMAL LOW (ref 4.0–10.5)

## 2016-06-09 LAB — LACTATE DEHYDROGENASE: LDH: 191 U/L (ref 98–192)

## 2016-06-09 NOTE — Assessment & Plan Note (Addendum)
Stage I (T1AN0M0) invasive ductal carcinoma of left breast, ER9%/PR10% (07/03/1998) treated with lumpectomy by Dr. Arnoldo Morale, followed by XRT, and then Tamoxifen x 5 years.  Staging in [facility] Problem list is completed.  Labs today: CBC diff, CMET, LDH, Vit D.  I personally reviewed and went over laboratory results with the patient.  The results are noted within this dictation.  I personally reviewed and went over radiographic studies with the patient.  The results are noted within this dictation.  Mammogram in November 2016 was negative.  She will be due this November for her next mammogram.  Order is placed.  Return in 1 year for follow-up.

## 2016-06-09 NOTE — Progress Notes (Signed)
Lisa Neighbors, MD [place] [address]  Neutropenia, unspecified type ([place]) - Plan: MICARDIS HCT 80-25 MG tablet, CBC with Differential, Pathologist smear review, Hepatitis panel, acute, HIV antibody (with reflex), Vitamin B12, Folate, Copper, serum, Sedimentation rate, C-reactive protein  Invasive ductal carcinoma of left breast (HCC)  Adenocarcinoma of left lung (HCC) - Plan: CT Chest Wo Contrast  CURRENT THERAPY:  Surveillance per NCCN guidelines  INTERVAL HISTORY: Lisa [name] 82 y.o. female returns for followup of Stage I (T1AN0M0) invasive ductal carcinoma of left breast, ER9%/PR10% (07/03/1998) treated with lumpectomy by Dr. Arnoldo Morale, followed by XRT, and then Tamoxifen x 5 years. AND Stage I (T1AN0M0) adenocarcinoma of LUL, S/P resection by Dr. Arlyce Dice on 06/01/2005. AND Leukopenia with neutropenia, progressive.  She denies any complaints today.  She denies any new cough or hemoptysis.  Her appetite is stable.  No weight loss noted.  She denies any breast changes as well.  She denies any recent illness or hospitalizations.  Review of Systems  Constitutional: Negative.  Negative for chills, fever and weight loss.  HENT: Negative.   Eyes: Negative.  Negative for blurred vision and double vision.  Respiratory: Negative.  Negative for cough, hemoptysis and shortness of breath.   Cardiovascular: Negative.  Negative for chest pain.  Gastrointestinal: Negative.  Negative for abdominal pain, blood in stool, constipation, diarrhea, melena, nausea and vomiting.  Genitourinary: Negative.  Negative for dysuria and urgency.  Musculoskeletal: Negative.  Negative for falls.  Skin: Negative.   Neurological: Negative.  Negative for weakness and headaches.  Endo/Heme/Allergies: Negative.   Psychiatric/Behavioral: Negative.     Past Medical History:  Diagnosis Date  . Adenocarcinoma of left lung ([place]) 06/09/2016  . Cancer of breast, female ([place])   . Cancer of lung  ([place])   . Hypertension   . Hypothyroidism   . Invasive ductal carcinoma of left breast ([place]) 06/09/2016  . Neutropenia ([place]) 06/09/2016    Past Surgical History:  Procedure Laterality Date  . BREAST BIOPSY     left axillary node dissection  . COLONOSCOPY  03/08/2003   OEH:[id number] rectal polyps destroyed with the tip of the snare/Polyps at hepatic flexure, splenic flexure at 35 cm/Left-sided diverticula: unable to retrieve path  . COLONOSCOPY  09/12/2008   BBC:WUGQBV rectum and distal sigmoid diminutive polyps/scattered left sided diverticulum. hyperplastic  . COLONOSCOPY N/A 03/06/2013   QXI:HWTUUEK polyp-removed as described above; colonic diverticulosis. hyperplastic polyps. next TCS 03/2018  . FOOT SURGERY    . LUNG REMOVAL, PARTIAL     upper lobe    Family History  Problem Relation Age of Onset  . Cancer Mother   . Cancer Sister   . Colon cancer Neg Hx     Social History   Social History  . Marital status: Widowed    Spouse name: N/A  . Number of children: N/A  . Years of education: N/A   Social History Main Topics  . Smoking status: Former Smoker    Packs/day: 0.75    Years: 14.00  . Smokeless tobacco: Never Used     Comment: smoke-free X 30 yeras  . Alcohol use No  . Drug use: No  . Sexual activity: Not Asked   Other Topics Concern  . None   Social History Narrative  . None     PHYSICAL EXAMINATION  ECOG PERFORMANCE STATUS: 0 - Asymptomatic  Vitals:   06/09/16 1300  BP: (!) 141/58  Pulse: 64  Resp: 18  Temp: 97.6 F (36.4 C)    GENERAL:alert, no distress, well nourished, well developed, comfortable, cooperative, obese, smiling and unaccompanied SKIN: skin color, texture, turgor are normal, no rashes or significant lesions HEAD: Normocephalic, No masses, lesions, tenderness or abnormalities EYES: normal, EOMI, Conjunctiva are pink and non-injected EARS: External ears normal OROPHARYNX:lips, buccal mucosa, and tongue normal and mucous membranes  are moist  NECK: supple, no adenopathy, trachea midline LYMPH:  no palpable lymphadenopathy, no hepatosplenomegaly BREAST:right breast normal without mass, skin or nipple changes or axillary nodes, left post-lumpectomy site well healed and free of suspicious changes LUNGS: clear to auscultation and percussion HEART: regular rate & rhythm, no murmurs, no gallops, S1 normal and S2 normal ABDOMEN:abdomen soft, non-tender, obese and normal bowel sounds BACK: Back symmetric, no curvature. EXTREMITIES:less then 2 second capillary refill, no joint deformities, effusion, or inflammation, no skin discoloration, no cyanosis  NEURO: alert & oriented x 3 with fluent speech, no focal motor/sensory deficits, gait normal   LABORATORY DATA: CBC    Component Value Date/Time   WBC 3.2 (L) 06/09/2016 1221   RBC 3.88 06/09/2016 1221   HGB 12.0 06/09/2016 1221   HCT 38.0 06/09/2016 1221   PLT 183 06/09/2016 1221   MCV 97.9 06/09/2016 1221   MCH 30.9 06/09/2016 1221   MCHC 31.6 06/09/2016 1221   RDW 14.3 06/09/2016 1221   LYMPHSABS 2.1 06/09/2016 1221   MONOABS 0.5 06/09/2016 1221   EOSABS 0.1 06/09/2016 1221   BASOSABS 0.0 06/09/2016 1221      Chemistry      Component Value Date/Time   NA 136 06/09/2016 1221   K 3.8 06/09/2016 1221   CL 102 06/09/2016 1221   CO2 29 06/09/2016 1221   BUN 17 06/09/2016 1221   CREATININE 1.08 (H) 06/09/2016 1221      Component Value Date/Time   CALCIUM 8.8 (L) 06/09/2016 1221   ALKPHOS 51 06/09/2016 1221   AST 20 06/09/2016 1221   ALT 12 (L) 06/09/2016 1221   BILITOT 0.7 06/09/2016 1221        PENDING LABS:   RADIOGRAPHIC STUDIES:  No results found.   PATHOLOGY:    ASSESSMENT AND PLAN:  Invasive ductal carcinoma of left breast (HCC) Stage I (T1AN0M0) invasive ductal carcinoma of left breast, ER9%/PR10% (07/03/1998) treated with lumpectomy by Dr. Arnoldo Morale, followed by XRT, and then Tamoxifen x 5 years.  Staging in [facility] Problem list is  completed.  Labs today: CBC diff, CMET, LDH, Vit D.  I personally reviewed and went over laboratory results with the patient.  The results are noted within this dictation.  I personally reviewed and went over radiographic studies with the patient.  The results are noted within this dictation.  Mammogram in November 2016 was negative.  She will be due this November for her next mammogram.  Order is placed.  Return in 1 year for follow-up.  Adenocarcinoma of left lung (HCC) Stage I (T1AN0M0) adenocarcinoma of LUL, S/P resection by Dr. Arlyce Dice on 06/01/2005.  Staging in [facility] Problem list is completed.  Labs today: CBC diff, CMET.  I personally reviewed and went over laboratory results with the patient.  The results are noted within this dictation.  She is overdue for her annual CT of chest imaging based upon updated NCCN guidelines.  Order is placed for CT of chest wo contrast.  If stable, next year, Ct will be low-dose, non-contrasted CT.  Labs in 1 year: CBC diff, CMET.    Return in 1 year  for follow-up.   Neutropenia (HCC) Leukopenia with neutropenia, progressive.  No new medications.  She denies any recent illness or hospitalization.  Current medication list is reviewed in detail.  No medications known to cause leukopenia/neutropenia.  Labs in 2 months: CBC diff, pathology smear review, hepatitis panel, HIV antibody, Folate, B12, serum copper, ESR, CRP.  Based upon aforementioned result, further recommendations will be provided.   ORDERS PLACED FOR THIS ENCOUNTER: Orders Placed This Encounter  Procedures  . CT Chest Wo Contrast  . CBC with Differential  . Pathologist smear review  . Hepatitis panel, acute  . HIV antibody (with reflex)  . Vitamin B12  . Folate  . Copper, serum  . Sedimentation rate  . C-reactive protein    MEDICATIONS PRESCRIBED THIS ENCOUNTER: Meds ordered this encounter  Medications  . MICARDIS HCT 80-25 MG tablet    THERAPY PLAN:  Newly  discovered neutropenia, below what would be expected as a normal variant or BEN.  Further work-up is indicated.  NCCN guidelines for Non-Small Cell Lung Cancer Surveillance in the setting of clinical/radiographic remission are as follows (5.2017):  A. Stage I-II (primary treatment included surgery +/- chemotherapy):   1. H+P and chest CT +/- contrast every 6 months for 2-3 years, then H+P and low-dose non-contrast-enhanced chest CT annually  B. Stage I-II (primary treatment included RT) or Stage III or Stage IV (oligometastatic with all sites treated with definitive intent)   1. H+P and chest CT +/- contrast every 3-6 months for 3 years, then H+P and chest CT +/- contrast every 6 months for 2 years, then H+P and low-dose non-contrast-enhanced chest CT annually    A. Residual or new radiographic abnormalities may require more frequent imaging  C. Smoking cessation advice, counseling, and pharmacotherapy  D. PET/CT or Brain MRI is not routinely indicated.  NCCN guidelines recommends the following surveillance for invasive breast cancer (2.2017):  A. History and Physical exam 1-4 times per year as clinically appropriate for 5 years, then annually.  B. Periodic screening for changes in family history and referral to genetics counseling as indicated  C. Educate, monitor, and refer to lymphedema management.  D. Mammography every 12 months  E. Routine imaging of reconstructed breast is not indicated.  F. In the absence of clinical signs and symptoms suggestive of recurrent disease, there is no indication for laboratory or imaging studies for metastases screening.  G. Women on Tamoxifen: annual gynecologic assessment every 12 months if uterus is present.  H. Women on aromatase inhibitor or who experience ovarian failure secondary to treatment should have monitoring of bone health with a bone mineral density determination at baseline and periodically thereafter.  I. Assess and encourage adherence to  adjuvant endocrine therapy.  J. Evidence suggests that active lifestyle, healthy diet, limited alcohol intake, and achieving and maintaining an ideal body weight (20-25 BMI) may lead to optimal breast cancer outcomes.   All questions were answered. The patient knows to call the clinic with any problems, questions or concerns. We can certainly see the patient much sooner if necessary.  Patient and plan discussed with Dr. Ancil Linsey and she is in agreement with the aforementioned.   This note is electronically signed by: Doy Mince 06/09/2016 6:20 PM

## 2016-06-09 NOTE — Assessment & Plan Note (Addendum)
Leukopenia with neutropenia, progressive.  No new medications.  She denies any recent illness or hospitalization.  Current medication list is reviewed in detail.  No medications known to cause leukopenia/neutropenia.  Labs in 2 months: CBC diff, pathology smear review, hepatitis panel, HIV antibody, Folate, B12, serum copper, ESR, CRP.  Based upon aforementioned result, further recommendations will be provided.

## 2016-06-09 NOTE — Patient Instructions (Signed)
[place] at [facility] Discharge Instructions  RECOMMENDATIONS MADE BY THE CONSULTANT AND ANY TEST RESULTS WILL BE SENT TO YOUR REFERRING PHYSICIAN.  You saw Kirby Crigler, PA-C, today. Labs in 2 months Mammogram in November Follow up in one year.  Thank you for choosing [place] at [facility] to provide your oncology and hematology care.  To afford each patient quality time with our provider, please arrive at least 15 minutes before your scheduled appointment time.   Beginning January 23rd 2017 lab work for the [facility] will be done in the  Main lab at [facility] on 1st floor. If you have a lab appointment with the [place] please come in thru the  Main Entrance and check in at the main information desk  You need to re-schedule your appointment should you arrive 10 or more minutes late.  We strive to give you quality time with our providers, and arriving late affects you and other patients whose appointments are after yours.  Also, if you no show three or more times for appointments you may be dismissed from the clinic at the providers discretion.     Again, thank you for choosing [facility].  Our hope is that these requests will decrease the amount of time that you wait before being seen by our physicians.       _____________________________________________________________  Should you have questions after your visit to [facility], please contact our office at [phone] between the hours of 8:30 a.m. and 4:30 p.m.  Voicemails left after 4:30 p.m. will not be returned until the following business day.  For prescription refill requests, have your pharmacy contact our office.         Resources For Cancer Patients and their Caregivers ? American Cancer Society: Can assist with transportation, wigs, general needs, runs Look Good Feel Better.        671-441-3216 ? Cancer Care: Provides financial  assistance, online support groups, medication/co-pay assistance.  1-800-813-HOPE 813-864-1454) ? [place] Assists [place] cancer patients and their families through emotional , educational and financial support.  534-566-4784 ? [place] DSS Where to apply for food stamps, Medicaid and utility assistance. 662-159-6474 ? [employer]: Transportation to medical appointments. (571)571-0690 ? Social Security Administration: May apply for disability if have a Stage IV cancer. 937 018 8975 908-505-1109 ? [employer], Disability and Transit Services: Assists with nutrition, care and transit needs. [place] Support Programs: '@10RELATIVEDAYS'$ @ > Cancer Support Group  2nd Tuesday of the month 1pm-2pm, Journey Room  > Creative Journey  3rd Tuesday of the month 1130am-1pm, Journey Room  > Look Good Feel Better  1st Wednesday of the month 10am-12 noon, Journey Room (Call [place] to register [phone])

## 2016-06-09 NOTE — Assessment & Plan Note (Addendum)
Stage I (T1AN0M0) adenocarcinoma of LUL, S/P resection by Dr. Arlyce Dice on 06/01/2005.  Staging in [facility] Problem list is completed.  Labs today: CBC diff, CMET.  I personally reviewed and went over laboratory results with the patient.  The results are noted within this dictation.  She is overdue for her annual CT of chest imaging based upon updated NCCN guidelines.  Order is placed for CT of chest wo contrast.  If stable, next year, Ct will be low-dose, non-contrasted CT.  Labs in 1 year: CBC diff, CMET.    Return in 1 year for follow-up.

## 2016-06-10 ENCOUNTER — Other Ambulatory Visit (HOSPITAL_COMMUNITY): Payer: Self-pay

## 2016-06-10 ENCOUNTER — Other Ambulatory Visit (HOSPITAL_COMMUNITY): Payer: Self-pay | Admitting: Oncology

## 2016-06-10 DIAGNOSIS — D709 Neutropenia, unspecified: Secondary | ICD-10-CM

## 2016-06-10 DIAGNOSIS — C3492 Malignant neoplasm of unspecified part of left bronchus or lung: Secondary | ICD-10-CM

## 2016-06-10 DIAGNOSIS — E559 Vitamin D deficiency, unspecified: Secondary | ICD-10-CM | POA: Insufficient documentation

## 2016-06-10 LAB — VITAMIN D 25 HYDROXY (VIT D DEFICIENCY, FRACTURES): VIT D 25 HYDROXY: 21.1 ng/mL — AB (ref 30.0–100.0)

## 2016-06-10 MED ORDER — ERGOCALCIFEROL 1.25 MG (50000 UT) PO CAPS: 50000.0000 [IU] | ORAL_CAPSULE | ORAL | 0 refills | Status: DC

## 2016-06-11 LAB — PATHOLOGIST SMEAR REVIEW

## 2016-06-23 ENCOUNTER — Ambulatory Visit (HOSPITAL_COMMUNITY)
Admission: RE | Admit: 2016-06-23 | Discharge: 2016-06-23 | Disposition: A | Discharge status: Home / Self Care | Payer: Medicare Other | Source: Ambulatory Visit | Attending: Oncology | Admitting: Oncology

## 2016-06-23 ENCOUNTER — Telehealth (HOSPITAL_COMMUNITY): Payer: Self-pay | Admitting: *Deleted

## 2016-06-23 DIAGNOSIS — I700 Atherosclerosis of aorta: Secondary | ICD-10-CM | POA: Insufficient documentation

## 2016-06-23 DIAGNOSIS — J439 Emphysema, unspecified: Secondary | ICD-10-CM | POA: Diagnosis not present

## 2016-06-23 DIAGNOSIS — C3490 Malignant neoplasm of unspecified part of unspecified bronchus or lung: Secondary | ICD-10-CM | POA: Diagnosis not present

## 2016-06-23 DIAGNOSIS — C3492 Malignant neoplasm of unspecified part of left bronchus or lung: Secondary | ICD-10-CM | POA: Insufficient documentation

## 2016-06-23 DIAGNOSIS — I2510 Atherosclerotic heart disease of native coronary artery without angina pectoris: Secondary | ICD-10-CM | POA: Diagnosis not present

## 2016-06-23 NOTE — Telephone Encounter (Signed)
Pt aware of chest xray results and aware to recheck in 12 months. Pt verbalized understanding.

## 2016-06-23 NOTE — Telephone Encounter (Signed)
-----   Message from Baird Cancer, PA-C sent at 06/23/2016  4:14 PM EDT ----- Stable.  Will repeat in 12 months

## 2016-07-07 ENCOUNTER — Other Ambulatory Visit: Payer: Self-pay

## 2016-07-31 DIAGNOSIS — Z23 Encounter for immunization: Secondary | ICD-10-CM | POA: Diagnosis not present

## 2016-08-10 ENCOUNTER — Other Ambulatory Visit (HOSPITAL_COMMUNITY): Payer: Medicare Other

## 2016-08-20 ENCOUNTER — Other Ambulatory Visit (HOSPITAL_COMMUNITY): Payer: Self-pay | Admitting: Internal Medicine

## 2016-08-20 DIAGNOSIS — Z1231 Encounter for screening mammogram for malignant neoplasm of breast: Secondary | ICD-10-CM

## 2016-09-30 ENCOUNTER — Ambulatory Visit (HOSPITAL_COMMUNITY): Payer: Medicare Other

## 2016-10-02 DIAGNOSIS — I482 Chronic atrial fibrillation: Secondary | ICD-10-CM | POA: Diagnosis not present

## 2016-10-02 DIAGNOSIS — I10 Essential (primary) hypertension: Secondary | ICD-10-CM | POA: Diagnosis not present

## 2016-10-02 DIAGNOSIS — R7301 Impaired fasting glucose: Secondary | ICD-10-CM | POA: Diagnosis not present

## 2016-10-02 DIAGNOSIS — E119 Type 2 diabetes mellitus without complications: Secondary | ICD-10-CM | POA: Diagnosis not present

## 2016-10-02 DIAGNOSIS — E039 Hypothyroidism, unspecified: Secondary | ICD-10-CM | POA: Diagnosis not present

## 2016-10-02 DIAGNOSIS — E782 Mixed hyperlipidemia: Secondary | ICD-10-CM | POA: Diagnosis not present

## 2016-10-02 DIAGNOSIS — D509 Iron deficiency anemia, unspecified: Secondary | ICD-10-CM | POA: Diagnosis not present

## 2016-10-02 DIAGNOSIS — E785 Hyperlipidemia, unspecified: Secondary | ICD-10-CM | POA: Diagnosis not present

## 2016-10-06 DIAGNOSIS — E039 Hypothyroidism, unspecified: Secondary | ICD-10-CM | POA: Diagnosis not present

## 2016-10-06 DIAGNOSIS — Z6835 Body mass index (BMI) 35.0-35.9, adult: Secondary | ICD-10-CM | POA: Diagnosis not present

## 2016-10-06 DIAGNOSIS — D649 Anemia, unspecified: Secondary | ICD-10-CM | POA: Diagnosis not present

## 2016-10-06 DIAGNOSIS — E782 Mixed hyperlipidemia: Secondary | ICD-10-CM | POA: Diagnosis not present

## 2016-10-06 DIAGNOSIS — I10 Essential (primary) hypertension: Secondary | ICD-10-CM | POA: Diagnosis not present

## 2016-10-06 DIAGNOSIS — M158 Other polyosteoarthritis: Secondary | ICD-10-CM | POA: Diagnosis not present

## 2016-10-06 DIAGNOSIS — G589 Mononeuropathy, unspecified: Secondary | ICD-10-CM | POA: Diagnosis not present

## 2016-10-08 ENCOUNTER — Ambulatory Visit (HOSPITAL_COMMUNITY)
Admission: RE | Admit: 2016-10-08 | Discharge: 2016-10-08 | Disposition: A | Discharge status: Home / Self Care | Payer: Medicare Other | Source: Ambulatory Visit | Attending: Hematology & Oncology | Admitting: Hematology & Oncology

## 2016-10-08 DIAGNOSIS — R918 Other nonspecific abnormal finding of lung field: Secondary | ICD-10-CM

## 2016-10-08 DIAGNOSIS — Z1231 Encounter for screening mammogram for malignant neoplasm of breast: Secondary | ICD-10-CM | POA: Diagnosis not present

## 2016-10-08 DIAGNOSIS — Z1239 Encounter for other screening for malignant neoplasm of breast: Secondary | ICD-10-CM

## 2017-01-15 DIAGNOSIS — M79671 Pain in right foot: Secondary | ICD-10-CM | POA: Diagnosis not present

## 2017-02-05 DIAGNOSIS — H52223 Regular astigmatism, bilateral: Secondary | ICD-10-CM | POA: Diagnosis not present

## 2017-02-05 DIAGNOSIS — H43812 Vitreous degeneration, left eye: Secondary | ICD-10-CM | POA: Diagnosis not present

## 2017-02-05 DIAGNOSIS — H524 Presbyopia: Secondary | ICD-10-CM | POA: Diagnosis not present

## 2017-02-05 DIAGNOSIS — H5203 Hypermetropia, bilateral: Secondary | ICD-10-CM | POA: Diagnosis not present

## 2017-04-01 DIAGNOSIS — E039 Hypothyroidism, unspecified: Secondary | ICD-10-CM | POA: Diagnosis not present

## 2017-04-01 DIAGNOSIS — D509 Iron deficiency anemia, unspecified: Secondary | ICD-10-CM | POA: Diagnosis not present

## 2017-04-01 DIAGNOSIS — E559 Vitamin D deficiency, unspecified: Secondary | ICD-10-CM | POA: Diagnosis not present

## 2017-04-01 DIAGNOSIS — E782 Mixed hyperlipidemia: Secondary | ICD-10-CM | POA: Diagnosis not present

## 2017-04-06 DIAGNOSIS — E782 Mixed hyperlipidemia: Secondary | ICD-10-CM | POA: Diagnosis not present

## 2017-04-06 DIAGNOSIS — E039 Hypothyroidism, unspecified: Secondary | ICD-10-CM | POA: Diagnosis not present

## 2017-04-06 DIAGNOSIS — D500 Iron deficiency anemia secondary to blood loss (chronic): Secondary | ICD-10-CM | POA: Diagnosis not present

## 2017-04-06 DIAGNOSIS — G9009 Other idiopathic peripheral autonomic neuropathy: Secondary | ICD-10-CM | POA: Diagnosis not present

## 2017-04-06 DIAGNOSIS — J45909 Unspecified asthma, uncomplicated: Secondary | ICD-10-CM | POA: Diagnosis not present

## 2017-04-06 DIAGNOSIS — M150 Primary generalized (osteo)arthritis: Secondary | ICD-10-CM | POA: Diagnosis not present

## 2017-04-06 DIAGNOSIS — I10 Essential (primary) hypertension: Secondary | ICD-10-CM | POA: Diagnosis not present

## 2017-04-13 DIAGNOSIS — B353 Tinea pedis: Secondary | ICD-10-CM | POA: Diagnosis not present

## 2017-04-13 DIAGNOSIS — G609 Hereditary and idiopathic neuropathy, unspecified: Secondary | ICD-10-CM | POA: Diagnosis not present

## 2017-05-25 DIAGNOSIS — G609 Hereditary and idiopathic neuropathy, unspecified: Secondary | ICD-10-CM | POA: Diagnosis not present

## 2017-05-25 DIAGNOSIS — M79671 Pain in right foot: Secondary | ICD-10-CM | POA: Diagnosis not present

## 2017-05-25 DIAGNOSIS — B353 Tinea pedis: Secondary | ICD-10-CM | POA: Diagnosis not present

## 2017-05-31 ENCOUNTER — Emergency Department (HOSPITAL_COMMUNITY): Payer: Medicare Other

## 2017-05-31 ENCOUNTER — Encounter (HOSPITAL_COMMUNITY): Payer: Self-pay | Admitting: Emergency Medicine

## 2017-05-31 ENCOUNTER — Inpatient Hospital Stay (HOSPITAL_COMMUNITY)
Admission: EM | Admit: 2017-05-31 | Discharge: 2017-06-01 | DRG: 189 | Disposition: A | Discharge status: Home / Self Care | Payer: Medicare Other | Attending: Internal Medicine | Admitting: Internal Medicine

## 2017-05-31 DIAGNOSIS — E039 Hypothyroidism, unspecified: Secondary | ICD-10-CM | POA: Diagnosis present

## 2017-05-31 DIAGNOSIS — I129 Hypertensive chronic kidney disease with stage 1 through stage 4 chronic kidney disease, or unspecified chronic kidney disease: Secondary | ICD-10-CM | POA: Diagnosis present

## 2017-05-31 DIAGNOSIS — Z79890 Hormone replacement therapy (postmenopausal): Secondary | ICD-10-CM

## 2017-05-31 DIAGNOSIS — Z902 Acquired absence of lung [part of]: Secondary | ICD-10-CM

## 2017-05-31 DIAGNOSIS — Z85118 Personal history of other malignant neoplasm of bronchus and lung: Secondary | ICD-10-CM

## 2017-05-31 DIAGNOSIS — D649 Anemia, unspecified: Secondary | ICD-10-CM | POA: Diagnosis present

## 2017-05-31 DIAGNOSIS — J441 Chronic obstructive pulmonary disease with (acute) exacerbation: Secondary | ICD-10-CM | POA: Diagnosis present

## 2017-05-31 DIAGNOSIS — R739 Hyperglycemia, unspecified: Secondary | ICD-10-CM | POA: Diagnosis present

## 2017-05-31 DIAGNOSIS — N1832 Chronic kidney disease, stage 3b: Secondary | ICD-10-CM | POA: Diagnosis present

## 2017-05-31 DIAGNOSIS — Z79899 Other long term (current) drug therapy: Secondary | ICD-10-CM | POA: Diagnosis not present

## 2017-05-31 DIAGNOSIS — J9611 Chronic respiratory failure with hypoxia: Secondary | ICD-10-CM | POA: Diagnosis present

## 2017-05-31 DIAGNOSIS — K6389 Other specified diseases of intestine: Secondary | ICD-10-CM | POA: Diagnosis not present

## 2017-05-31 DIAGNOSIS — I714 Abdominal aortic aneurysm, without rupture, unspecified: Secondary | ICD-10-CM | POA: Diagnosis present

## 2017-05-31 DIAGNOSIS — J449 Chronic obstructive pulmonary disease, unspecified: Secondary | ICD-10-CM | POA: Diagnosis present

## 2017-05-31 DIAGNOSIS — Z7982 Long term (current) use of aspirin: Secondary | ICD-10-CM

## 2017-05-31 DIAGNOSIS — N183 Chronic kidney disease, stage 3 unspecified: Secondary | ICD-10-CM | POA: Diagnosis present

## 2017-05-31 DIAGNOSIS — Z87891 Personal history of nicotine dependence: Secondary | ICD-10-CM

## 2017-05-31 DIAGNOSIS — R32 Unspecified urinary incontinence: Secondary | ICD-10-CM | POA: Diagnosis present

## 2017-05-31 DIAGNOSIS — Z853 Personal history of malignant neoplasm of breast: Secondary | ICD-10-CM

## 2017-05-31 DIAGNOSIS — J9811 Atelectasis: Secondary | ICD-10-CM | POA: Diagnosis not present

## 2017-05-31 DIAGNOSIS — J9601 Acute respiratory failure with hypoxia: Principal | ICD-10-CM | POA: Diagnosis present

## 2017-05-31 DIAGNOSIS — E278 Other specified disorders of adrenal gland: Secondary | ICD-10-CM | POA: Diagnosis not present

## 2017-05-31 DIAGNOSIS — J9621 Acute and chronic respiratory failure with hypoxia: Secondary | ICD-10-CM | POA: Diagnosis not present

## 2017-05-31 DIAGNOSIS — C3492 Malignant neoplasm of unspecified part of left bronchus or lung: Secondary | ICD-10-CM | POA: Diagnosis present

## 2017-05-31 DIAGNOSIS — R300 Dysuria: Secondary | ICD-10-CM | POA: Diagnosis not present

## 2017-05-31 DIAGNOSIS — R0902 Hypoxemia: Secondary | ICD-10-CM

## 2017-05-31 DIAGNOSIS — Z791 Long term (current) use of non-steroidal anti-inflammatories (NSAID): Secondary | ICD-10-CM | POA: Diagnosis not present

## 2017-05-31 DIAGNOSIS — Z86010 Personal history of colonic polyps: Secondary | ICD-10-CM | POA: Diagnosis not present

## 2017-05-31 DIAGNOSIS — N1831 Chronic kidney disease, stage 3a: Secondary | ICD-10-CM | POA: Diagnosis present

## 2017-05-31 HISTORY — DX: Unspecified asthma, uncomplicated: J45.909

## 2017-05-31 HISTORY — DX: Unspecified osteoarthritis, unspecified site: M19.90

## 2017-05-31 LAB — PROCALCITONIN: Procalcitonin: 0.11 ng/mL

## 2017-05-31 LAB — URINALYSIS, ROUTINE W REFLEX MICROSCOPIC
BACTERIA UA: NONE SEEN
BILIRUBIN URINE: NEGATIVE
GLUCOSE, UA: NEGATIVE mg/dL
Hgb urine dipstick: NEGATIVE
Ketones, ur: NEGATIVE mg/dL
NITRITE: NEGATIVE
PH: 7 (ref 5.0–8.0)
Protein, ur: NEGATIVE mg/dL
SPECIFIC GRAVITY, URINE: 1.008 (ref 1.005–1.030)

## 2017-05-31 LAB — TROPONIN I: Troponin I: <0.03 ng/mL (ref <0.03)

## 2017-05-31 LAB — COMPREHENSIVE METABOLIC PANEL
ALBUMIN: 3.6 g/dL (ref 3.5–5.0)
ALK PHOS: 58 U/L (ref 38–126)
ALT: 24 U/L (ref 14–54)
ANION GAP: 10 (ref 5–15)
AST: 24 U/L (ref 15–41)
BILIRUBIN TOTAL: 1.4 mg/dL — AB (ref 0.3–1.2)
BUN: 16 mg/dL (ref 6–20)
CALCIUM: 9.7 mg/dL (ref 8.9–10.3)
CO2: 29 mmol/L (ref 22–32)
Chloride: 100 mmol/L — ABNORMAL LOW (ref 101–111)
Creatinine, Ser: 0.99 mg/dL (ref 0.44–1.00)
GFR calc Af Amer: >60 mL/min (ref >60)
GFR, EST NON AFRICAN AMERICAN: 52 mL/min — AB (ref >60)
GLUCOSE: 116 mg/dL — AB (ref 65–99)
Potassium: 3.7 mmol/L (ref 3.5–5.1)
Sodium: 139 mmol/L (ref 135–145)
TOTAL PROTEIN: 8.2 g/dL — AB (ref 6.5–8.1)

## 2017-05-31 LAB — CBC WITH DIFFERENTIAL/PLATELET
Basophils Absolute: 0 10*3/uL (ref 0.0–0.1)
Basophils Relative: 0 %
EOS PCT: 0 %
Eosinophils Absolute: 0 10*3/uL (ref 0.0–0.7)
HCT: 34.2 % — ABNORMAL LOW (ref 36.0–46.0)
Hemoglobin: 10.9 g/dL — ABNORMAL LOW (ref 12.0–15.0)
LYMPHS PCT: 19 %
Lymphs Abs: 1.3 10*3/uL (ref 0.7–4.0)
MCH: 31.3 pg (ref 26.0–34.0)
MCHC: 31.9 g/dL (ref 30.0–36.0)
MCV: 98.3 fL (ref 78.0–100.0)
MONO ABS: 2.1 10*3/uL — AB (ref 0.1–1.0)
Monocytes Relative: 30 %
Neutro Abs: 3.7 10*3/uL (ref 1.7–7.7)
Neutrophils Relative %: 51 %
PLATELETS: 131 10*3/uL — AB (ref 150–400)
RBC: 3.48 MIL/uL — ABNORMAL LOW (ref 3.87–5.11)
RDW: 14.1 % (ref 11.5–15.5)
WBC: 7.1 10*3/uL (ref 4.0–10.5)

## 2017-05-31 LAB — LACTIC ACID, PLASMA
LACTIC ACID, VENOUS: 2 mmol/L — AB (ref 0.5–1.9)
LACTIC ACID, VENOUS: 1.9 mmol/L (ref 0.5–1.9)
Lactic Acid, Venous: 1 mmol/L (ref 0.5–1.9)

## 2017-05-31 LAB — PROTIME-INR
INR: 1.2
Prothrombin Time: 15.3 seconds — ABNORMAL HIGH (ref 11.4–15.2)

## 2017-05-31 LAB — TSH: TSH: 0.544 u[IU]/mL (ref 0.350–4.500)

## 2017-05-31 LAB — APTT: APTT: 43 s — AB (ref 24–36)

## 2017-05-31 LAB — LIPASE, BLOOD: LIPASE: 20 U/L (ref 11–51)

## 2017-05-31 LAB — BRAIN NATRIURETIC PEPTIDE: B Natriuretic Peptide: 312 pg/mL — ABNORMAL HIGH (ref 0.0–100.0)

## 2017-05-31 MED ORDER — ORAL CARE MOUTH RINSE
15.0000 mL | Freq: Two times a day (BID) | OROMUCOSAL | Status: DC
  Administered 2017-05-31: 15 mL via OROMUCOSAL

## 2017-05-31 MED ORDER — GABAPENTIN 300 MG PO CAPS
ORAL_CAPSULE | ORAL | Status: AC
  Filled 2017-05-31: qty 2

## 2017-05-31 MED ORDER — ASPIRIN EC 81 MG PO TBEC
81.0000 mg | DELAYED_RELEASE_TABLET | Freq: Every day | ORAL | Status: DC
  Administered 2017-06-01: 81 mg via ORAL
  Filled 2017-05-31: qty 1

## 2017-05-31 MED ORDER — LEVOTHYROXINE SODIUM 100 MCG PO TABS
100.0000 ug | ORAL_TABLET | Freq: Every day | ORAL | Status: DC
  Administered 2017-06-01: 100 ug via ORAL
  Filled 2017-05-31: qty 1

## 2017-05-31 MED ORDER — TELMISARTAN-HCTZ 80-12.5 MG PO TABS: 1.0000 | ORAL_TABLET | Freq: Every day | ORAL | Status: DC

## 2017-05-31 MED ORDER — METOPROLOL SUCCINATE ER 50 MG PO TB24
50.0000 mg | ORAL_TABLET | Freq: Every day | ORAL | Status: DC
  Administered 2017-06-01: 50 mg via ORAL
  Filled 2017-05-31: qty 1

## 2017-05-31 MED ORDER — ONDANSETRON HCL 4 MG PO TABS: 4.0000 mg | ORAL_TABLET | Freq: Four times a day (QID) | ORAL | Status: DC | PRN

## 2017-05-31 MED ORDER — HYDROCHLOROTHIAZIDE 12.5 MG PO CAPS
12.5000 mg | ORAL_CAPSULE | Freq: Every day | ORAL | Status: DC
  Administered 2017-06-01: 12.5 mg via ORAL
  Filled 2017-05-31: qty 1

## 2017-05-31 MED ORDER — ONDANSETRON HCL 4 MG/2ML IJ SOLN: 4.0000 mg | Freq: Four times a day (QID) | INTRAMUSCULAR | Status: DC | PRN

## 2017-05-31 MED ORDER — ENOXAPARIN SODIUM 40 MG/0.4ML ~~LOC~~ SOLN
40.0000 mg | SUBCUTANEOUS | Status: DC
  Administered 2017-05-31: 40 mg via SUBCUTANEOUS
  Filled 2017-05-31: qty 0.4

## 2017-05-31 MED ORDER — IPRATROPIUM-ALBUTEROL 0.5-2.5 (3) MG/3ML IN SOLN
3.0000 mL | Freq: Four times a day (QID) | RESPIRATORY_TRACT | Status: DC
  Administered 2017-06-01 (×3): 3 mL via RESPIRATORY_TRACT
  Filled 2017-05-31 (×3): qty 3

## 2017-05-31 MED ORDER — ALBUTEROL SULFATE (2.5 MG/3ML) 0.083% IN NEBU: 2.5000 mg | INHALATION_SOLUTION | Freq: Once | RESPIRATORY_TRACT | Status: DC

## 2017-05-31 MED ORDER — IPRATROPIUM BROMIDE 0.02 % IN SOLN
1.0000 mg | Freq: Once | RESPIRATORY_TRACT | Status: AC
  Administered 2017-05-31: 1 mg via RESPIRATORY_TRACT
  Filled 2017-05-31: qty 5

## 2017-05-31 MED ORDER — IPRATROPIUM-ALBUTEROL 0.5-2.5 (3) MG/3ML IN SOLN: 3.0000 mL | Freq: Once | RESPIRATORY_TRACT | Status: DC

## 2017-05-31 MED ORDER — GABAPENTIN 300 MG PO CAPS
600.0000 mg | ORAL_CAPSULE | Freq: Three times a day (TID) | ORAL | Status: DC
  Administered 2017-05-31 – 2017-06-01 (×2): 600 mg via ORAL
  Filled 2017-05-31 (×2): qty 2

## 2017-05-31 MED ORDER — ALBUTEROL SULFATE (2.5 MG/3ML) 0.083% IN NEBU: 2.5000 mg | INHALATION_SOLUTION | RESPIRATORY_TRACT | Status: DC | PRN

## 2017-05-31 MED ORDER — METHYLPREDNISOLONE SODIUM SUCC 125 MG IJ SOLR
60.0000 mg | Freq: Two times a day (BID) | INTRAMUSCULAR | Status: DC
  Administered 2017-05-31: 60 mg via INTRAVENOUS
  Filled 2017-05-31 (×2): qty 2

## 2017-05-31 MED ORDER — ALBUTEROL (5 MG/ML) CONTINUOUS INHALATION SOLN
10.0000 mg/h | INHALATION_SOLUTION | Freq: Once | RESPIRATORY_TRACT | Status: AC
  Administered 2017-05-31: 10 mg/h via RESPIRATORY_TRACT
  Filled 2017-05-31: qty 20

## 2017-05-31 MED ORDER — ACETAMINOPHEN 650 MG RE SUPP: 650.0000 mg | Freq: Four times a day (QID) | RECTAL | Status: DC | PRN

## 2017-05-31 MED ORDER — GABAPENTIN 600 MG PO TABS
600.0000 mg | ORAL_TABLET | Freq: Three times a day (TID) | ORAL | Status: DC
  Filled 2017-05-31 (×5): qty 1

## 2017-05-31 MED ORDER — IRBESARTAN 300 MG PO TABS
300.0000 mg | ORAL_TABLET | Freq: Every day | ORAL | Status: DC
Start: 2017-06-01 — End: 2017-06-01
  Administered 2017-06-01: 300 mg via ORAL
  Filled 2017-05-31: qty 1

## 2017-05-31 MED ORDER — ACETAMINOPHEN 325 MG PO TABS: 650.0000 mg | ORAL_TABLET | Freq: Four times a day (QID) | ORAL | Status: DC | PRN

## 2017-05-31 MED ORDER — METHYLPREDNISOLONE SODIUM SUCC 125 MG IJ SOLR
125.0000 mg | Freq: Once | INTRAMUSCULAR | Status: AC
  Administered 2017-05-31: 125 mg via INTRAVENOUS
  Filled 2017-05-31: qty 2

## 2017-05-31 MED ORDER — DOCUSATE SODIUM 100 MG PO CAPS
100.0000 mg | ORAL_CAPSULE | Freq: Two times a day (BID) | ORAL | Status: DC
  Administered 2017-05-31 – 2017-06-01 (×2): 100 mg via ORAL
  Filled 2017-05-31 (×2): qty 1

## 2017-05-31 NOTE — ED Notes (Signed)
Patient transported to CT 

## 2017-05-31 NOTE — ED Triage Notes (Addendum)
Patient complaining of being incontinent of urine x 2 days. States she had abdominal pain yesterday but denies pain today. Patient's O2 saturation 89% on room air in triage. Patient states she is not on oxygen.

## 2017-05-31 NOTE — ED Notes (Signed)
CRITICAL VALUE ALERT  Critical Value:  Lactic Acid 2.0  Date & Time Notied:  05/31/17 1830  Provider Notified: Dr. Thurnell Garbe and Dr. Lorin Mercy  Orders Received/Actions taken: No new orders, MD aware

## 2017-05-31 NOTE — ED Notes (Signed)
Patient transported to X-ray 

## 2017-05-31 NOTE — ED Notes (Signed)
Ambulated pt on room air. Pts O2 sitting on the side of the bed dropped to %85. Ambulating O2 dropped to %80. Pt states she is winded and breathing is shallow without O2. Pt back to bed and placed back on O2 with an increase to %93.

## 2017-05-31 NOTE — ED Provider Notes (Signed)
[place] DEPT Provider Note   CSN: [id number] Arrival date & time: 05/31/17  1326     History   Chief Complaint Chief Complaint  Patient presents with  . Dysuria    HPI Lisa [name] is a 83 y.o. female.   Dysuria      Pt was seen at 1345. Per pt and her family, c/o gradual onset and persistence of constant urinary frequency and urgency for the past 2 days. States she "can't make it to the bathroom" and has been incont of urine. Pt endorses generalized abd pain yesterday, improved today. Pt also c/o gradual onset and persistence of constant cough and wheezing for the past 4 to 5 days. Pt states she has hx of asthma and has a MDI at home. Denies fevers, no back pain, no hematuria, no N/V/D, no CP/palpitations, no calf/LE pain or unilateral swelling.    Past Medical History:  Diagnosis Date  . Adenocarcinoma of left lung ([place]) 06/09/2016  . Asthma   . Cancer of breast, female ([place])   . Cancer of lung ([place])   . Hypertension   . Hypothyroidism   . Invasive ductal carcinoma of left breast ([place]) 06/09/2016  . Neutropenia ([place]) 06/09/2016    Patient Active Problem List   Diagnosis Date Noted  . Vitamin D deficiency 06/10/2016  . Neutropenia ([place]) 06/09/2016  . Invasive ductal carcinoma of left breast ([place]) 06/09/2016  . Adenocarcinoma of left lung ([place]) 06/09/2016  . Adenomatous polyps 02/08/2013    Past Surgical History:  Procedure Laterality Date  . BREAST BIOPSY     left axillary node dissection  . COLONOSCOPY  03/08/2003   XHB:ZJIRCVELFY rectal polyps destroyed with the tip of the snare/Polyps at hepatic flexure, splenic flexure at 35 cm/Left-sided diverticula: unable to retrieve path  . COLONOSCOPY  09/12/2008   BOF:BPZWCH rectum and distal sigmoid diminutive polyps/scattered left sided diverticulum. hyperplastic  . COLONOSCOPY N/A 03/06/2013   ENI:DPOEUMP polyp-removed as described above; colonic diverticulosis. hyperplastic polyps. next TCS 03/2018  . FOOT  SURGERY    . LUNG REMOVAL, PARTIAL     upper lobe    OB History    No data available       Home Medications    Prior to Admission medications   Medication Sig Start Date End Date Taking? Authorizing Provider  Acetaminophen (TYLENOL ARTHRITIS PAIN PO) Take 1 tablet by mouth as needed (pain).     [provider]  albuterol (PROVENTIL,VENTOLIN) 90 MCG/ACT inhaler Inhale 2 puffs into the lungs every 6 (six) hours as needed for wheezing.    Everardo All, MD  Alum Hydroxide-Mag Carbonate (GAVISCON PO) Take 1 capsule by mouth as needed (gas relief).     [provider]  aspirin 81 MG tablet Take 81 mg by mouth daily.      [provider]  ergocalciferol (VITAMIN D2) 50000 units capsule Take 1 capsule (50,000 Units total) by mouth once a week. 06/10/16   Baird Cancer, PA-C  levothyroxine (SYNTHROID, LEVOTHROID) 100 MCG tablet Take 100 mcg by mouth daily.     [provider]  metoprolol succinate (TOPROL-XL) 50 MG 24 hr tablet Take 50 mg by mouth daily. Take with or immediately following a meal.    [provider]  MICARDIS HCT 80-25 MG tablet  06/07/16   [provider]  telmisartan-hydrochlorothiazide (MICARDIS HCT) 80-12.5 MG per tablet Take 1 tablet by mouth daily.    [provider]  traMADol (ULTRAM) 50 MG tablet Take 50  mg by mouth every 4 (four) hours as needed. May take 1-2 tabs as needed      [provider]    Family History Family History  Problem Relation Age of Onset  . Cancer Mother   . Cancer Sister   . Colon cancer Neg Hx     Social History Social History  Substance Use Topics  . Smoking status: Former Smoker    Packs/day: 0.75    Years: 14.00  . Smokeless tobacco: Never Used     Comment: smoke-free X 30 yeras  . Alcohol use No     Allergies   Patient has no known allergies.   Review of Systems Review of Systems  Genitourinary: Positive for dysuria.  ROS: Statement: All systems  negative except as marked or noted in the HPI; Constitutional: Negative for fever and chills. ; ; Eyes: Negative for eye pain, redness and discharge. ; ; ENMT: Negative for ear pain, hoarseness, nasal congestion, sinus pressure and sore throat. ; ; Cardiovascular: Negative for chest pain, palpitations, diaphoresis, and peripheral edema. ; ; Respiratory: +cough, wheezing. Negative for stridor. ; ; Gastrointestinal: +abd pain. Negative for nausea, vomiting, diarrhea, blood in stool, hematemesis, jaundice and rectal bleeding. . ; ; Genitourinary: +urinary frequency and urgency. Negative for dysuria, flank pain and hematuria. ; ; Musculoskeletal: Negative for back pain and neck pain. Negative for swelling and trauma.; ; Skin: Negative for pruritus, rash, abrasions, blisters, bruising and skin lesion.; ; Neuro: Negative for headache, lightheadedness and neck stiffness. Negative for weakness, altered level of consciousness, altered mental status, extremity weakness, paresthesias, involuntary movement, seizure and syncope.      Physical Exam Updated Vital Signs BP (!) 150/66 (BP Location: Left Arm)   Pulse 76   Temp 98.2 F (36.8 C) (Oral)   Resp (!) 22   Ht 5\' 7"  (1.702 m)   Wt 91.6 kg (202 lb)   SpO2 (!) 89%   BMI 31.64 kg/m   Physical Exam 1350: Physical examination:  Nursing notes reviewed; Vital signs and O2 SAT reviewed;  Constitutional: Well developed, Well nourished, Well hydrated, In no acute distress; Head:  Normocephalic, atraumatic; Eyes: EOMI, PERRL, No scleral icterus; ENMT: Mouth and pharynx normal, Mucous membranes moist; Neck: Supple, Full range of motion, No lymphadenopathy; Cardiovascular: Regular rate and rhythm, No gallop; Respiratory: Breath sounds diminished & equal bilaterally, No wheezes.  Speaking full sentences with ease, Normal respiratory effort/excursion; Chest: Nontender, Movement normal; Abdomen: Soft, Nontender, Nondistended, Normal bowel sounds; Genitourinary: No CVA  tenderness; Extremities: Pulses normal, No tenderness, No edema, No calf edema or asymmetry.; Neuro: AA&Ox3, vague historian. Major CN grossly intact.  Speech clear. No gross focal motor or sensory deficits in extremities.; Skin: Color normal, Warm, Dry.   ED Treatments / Results  Labs (all labs ordered are listed, but only abnormal results are displayed)   EKG  EKG Interpretation  Date/Time:  Monday May 31 2017 14:19:12 EDT Ventricular Rate:  68 PR Interval:    QRS Duration: 89 QT Interval:  400 QTC Calculation: 426 R Axis:   62 Text Interpretation:  Sinus rhythm RSR' in V1 or V2, right VCD or RVH Baseline wander When compared with ECG of 08/23/2009 RSR' or QR pattern in V1 suggests right ventricular conduction delay is now Present Confirmed by [facility]  MD, Nunzio Cory [id number]) on 05/31/2017 2:26:04 PM       Radiology   Procedures Procedures (including critical care time)  Medications Ordered in ED Medications  albuterol (PROVENTIL,VENTOLIN) solution continuous  neb (not administered)  ipratropium (ATROVENT) nebulizer solution 1 mg (not administered)  methylPREDNISolone sodium succinate (SOLU-MEDROL) 125 mg/2 mL injection 125 mg (not administered)     Initial Impression / Assessment and Plan / ED Course  I have reviewed the triage vital signs and the nursing notes.  Pertinent labs & imaging results that were available during my care of the patient were reviewed by me and considered in my medical decision making (see chart for details).  MDM Reviewed: previous chart, nursing note and vitals Reviewed previous: labs and ECG Interpretation: labs, ECG, x-ray and CT scan Total time providing critical care: 30-74 minutes. This excludes time spent performing separately reportable procedures and services. Consults: admitting MD   CRITICAL CARE Performed by: Alfonzo Feller Total critical care time: 35 minutes Critical care time was exclusive of separately billable  procedures and treating other patients. Critical care was necessary to treat or prevent imminent or life-threatening deterioration. Critical care was time spent personally by me on the following activities: development of treatment plan with patient and/or surrogate as well as nursing, discussions with consultants, evaluation of patient's response to treatment, examination of patient, obtaining history from patient or surrogate, ordering and performing treatments and interventions, ordering and review of laboratory studies, ordering and review of radiographic studies, pulse oximetry and re-evaluation of patient's condition.  Results for orders placed or performed during the hospital encounter of 05/31/17  Urinalysis, Routine w reflex microscopic  Result Value Ref Range   Color, Urine YELLOW YELLOW   APPearance CLEAR CLEAR   Specific Gravity, Urine 1.008 1.005 - 1.030   pH 7.0 5.0 - 8.0   Glucose, UA NEGATIVE NEGATIVE mg/dL   Hgb urine dipstick NEGATIVE NEGATIVE   Bilirubin Urine NEGATIVE NEGATIVE   Ketones, ur NEGATIVE NEGATIVE mg/dL   Protein, ur NEGATIVE NEGATIVE mg/dL   Nitrite NEGATIVE NEGATIVE   Leukocytes, UA TRACE (A) NEGATIVE   RBC / HPF 0-5 0 - 5 RBC/hpf   WBC, UA 0-5 0 - 5 WBC/hpf   Bacteria, UA NONE SEEN NONE SEEN   Squamous Epithelial / LPF 0-5 (A) NONE SEEN  Comprehensive metabolic panel  Result Value Ref Range   Sodium 139 135 - 145 mmol/L   Potassium 3.7 3.5 - 5.1 mmol/L   Chloride 100 (L) 101 - 111 mmol/L   CO2 29 22 - 32 mmol/L   Glucose, Bld 116 (H) 65 - 99 mg/dL   BUN 16 6 - 20 mg/dL   Creatinine, Ser 0.99 0.44 - 1.00 mg/dL   Calcium 9.7 8.9 - 10.3 mg/dL   Total Protein 8.2 (H) 6.5 - 8.1 g/dL   Albumin 3.6 3.5 - 5.0 g/dL   AST 24 15 - 41 U/L   ALT 24 14 - 54 U/L   Alkaline Phosphatase 58 38 - 126 U/L   Total Bilirubin 1.4 (H) 0.3 - 1.2 mg/dL   GFR calc non Af Amer 52 (L) >60 mL/min   GFR calc Af Amer >60 >60 mL/min   Anion gap 10 5 - 15  Lipase, blood    Result Value Ref Range   Lipase 20 11 - 51 U/L  Lactic acid, plasma  Result Value Ref Range   Lactic Acid, Venous 1.0 0.5 - 1.9 mmol/L  CBC with Differential  Result Value Ref Range   WBC 7.1 4.0 - 10.5 K/uL   RBC 3.48 (L) 3.87 - 5.11 MIL/uL   Hemoglobin 10.9 (L) 12.0 - 15.0 g/dL   HCT 34.2 (L) 36.0 - 46.0 %  MCV 98.3 78.0 - 100.0 fL   MCH 31.3 26.0 - 34.0 pg   MCHC 31.9 30.0 - 36.0 g/dL   RDW 14.1 11.5 - 15.5 %   Platelets 131 (L) 150 - 400 K/uL   Neutrophils Relative % 51 %   Neutro Abs 3.7 1.7 - 7.7 K/uL   Lymphocytes Relative 19 %   Lymphs Abs 1.3 0.7 - 4.0 K/uL   Monocytes Relative 30 %   Monocytes Absolute 2.1 (H) 0.1 - 1.0 K/uL   Eosinophils Relative 0 %   Eosinophils Absolute 0.0 0.0 - 0.7 K/uL   Basophils Relative 0 %   Basophils Absolute 0.0 0.0 - 0.1 K/uL  Troponin I  Result Value Ref Range   Troponin I <0.03 <0.03 ng/mL  Brain natriuretic peptide  Result Value Ref Range   B Natriuretic Peptide 312.0 (H) 0.0 - 100.0 pg/mL   Dg Chest 2 View Result Date: 05/31/2017 CLINICAL DATA:  Pain and incontinence. EXAM: CHEST  2 VIEW COMPARISON:  May 18, 2011 FINDINGS: Mild atelectasis in the bases.  No acute abnormalities. IMPRESSION: No active cardiopulmonary disease. Electronically Signed   By: Dorise Bullion III M.D   On: 05/31/2017 15:54   Ct Renal Stone Study Result Date: 05/31/2017 CLINICAL DATA:  83 year old hypertensive female with urinary incontinence for 2 days. Abdominal pain yesterday but not today. Breast cancer and lung cancer post treatment. Initial encounter. EXAM: CT ABDOMEN AND PELVIS WITHOUT CONTRAST TECHNIQUE: Multidetector CT imaging of the abdomen and pelvis was performed following the standard protocol without IV contrast. COMPARISON:  PET-CT 03/13/2004. FINDINGS: Lower chest: Minimal scarring lung bases. Heart top-normal size with coronary artery calcifications. Small hiatal hernia. Hepatobiliary: Taking into account limitation by non contrast imaging,  no hepatic lesion. Question tiny gallbladder polyp. Pancreas: Taking into account limitation by non contrast imaging, no mass or inflammation Spleen: Taking into account limitation by non contrast imaging, no mass or enlargement. Adrenals/Urinary Tract: Mild hyperplasia adrenal glands. No renal or ureteral obstructing stone or hydronephrosis. Taking into account limitation by non contrast imaging, no renal mass. Partially contracted urinary bladder without abnormality noted. Stomach/Bowel: Underdistended portions of bowel without extraluminal bowel inflammatory process or free air. Specifically, no inflammation surrounds the appendix or scattered colonic diverticula. Vascular/Lymphatic: Infrarenal bilobed abdominal aortic aneurysm. Superior aspect measures up to 4 cm maximal transverse dimension and inferior aspect 3.4 cm. Atherosclerotic changes iliac arteries and femoral arteries. Left external iliac lymph node elongated but normal short axis dimension. Reproductive: No worrisome mass identified. Other: No bowel containing hernia. Musculoskeletal: No osseous destructive lesion. Mild degenerative changes lower lumbar spine. IMPRESSION: No urinary tract abnormality noted. Underdistended portions of bowel without extraluminal bowel inflammatory process or free air. Specifically, no inflammation surrounds the appendix or scattered colonic diverticula. Infrarenal bilobed abdominal aortic aneurysm. Superior aspect measures up to 4 cm maximal transverse dimension and inferior aspect 3.4 cm. Superior aspect measured 2.9 cm in 2009. Recommend followup by ultrasound in 1 year. This recommendation follows ACR consensus guidelines: White Paper of the ACR Incidental Findings Committee II on Vascular Findings. J Am Coll Radiol 2013; 10:789-794. Question tiny gallbladder polyp. Mild adrenal gland hyperplasia. Electronically Signed   By: Genia Del M.D.   On: 05/31/2017 16:02     1425:  On arrival, pt's O2 Sat 89% R/A. Pt  ambulated in ED to get urine sample and O2 Sat low 80's R/A on arrival back to exam room. Lungs diminished with wheezing, pt tachypneic. IV solumedrol and hour long neb  ordered.  1720:  After neb: pt appears more comfortable at rest, less tachypneic, Sats 95-98 % on O2 3L N/C, lungs continue diminished, scattered wheezing. Pt stood to ambulate next to bed and pt's O2 Sats dropped to 80 % R/A with pt c/o increasing SOB, with increasing HR and RR. Pt sat back to stretcher with O2 Sats slowly increasing to 92 % on O2 3L N/C.  Urinary "incontinence" likely due to [place].  Dx and testing d/w pt and family.  Questions answered.  Verb understanding, agreeable to admit. T/C to [facility] Dr. Lorin Mercy, case discussed, including:  HPI, pertinent PM/SHx, VS/PE, dx testing, ED course and treatment:  Agreeable to admit.   Final Clinical Impressions(s) / ED Diagnoses   Final diagnoses:  None    New Prescriptions New Prescriptions   No medications on file      Francine Graven, DO 06/03/17 [id number]

## 2017-05-31 NOTE — H&P (Signed)
History and Physical    Lisa [name] VZD:[id number] DOB: 07/16/1934 DOA: 05/31/2017  PCP: Celene Squibb, MD Consultants:  Talbert Cage - oncology; Caprice Beaver - podiatry Patient coming from: Home - lives with daughter; [facility]: daughter and 2 sons, [phone] (home); [phone] (cell)  Chief Complaint: urinary incontinence  HPI: Lisa [name] is a 83 y.o. female with medical history significant of remote breast and lung cancer s/p lobectomy; hypothyroidism; HTN; and emphysema previously diagnosed on CT presenting for urinary incontinence.  Patient reports that she has been having trouble holding her urine.  She felt unable to manage it.  She has had this problem before but it has been a long time so she decided to come in to be evaluated.  She was found to be hypoxic in the ER.  She did notice some difficulty breathing for about 2 weeks.  She reports that she would cough but it wouldn't come up.  She would "get to coughing and I couldn't stop".  That went on for about 2 weeks.  She had a breathing treatment here and that helped a lot, "I don't feel wheezy like I did but I still feel some".  Coughing up clear sputum.  She has a h/o lung cancer and has had partial lobectomy, has had some difficulty breathing since.   ED Course: Presented with c/o urinary incontinence.  89% on room air on arrival.  Ambulated to the restoom and O2 sat dropped into the low 80s.  When back in the room her lungs had wheezing and she was tachypneic.  Given neb with improved in WOB.  Sats 95-98% on 3L [place] O2.  More ambulation led to more hypoxia, as low as 80% again.  Incontinence thought to be due to inability to get to the restroom because of dyspnea.  Review of Systems: As per HPI; otherwise review of systems reviewed and negative.   Ambulatory Status:  Ambulates without assistance  Past Medical History:  Diagnosis Date  . Adenocarcinoma of left lung ([place]) 2006  . Arthritis   . Asthma   . Cancer of breast, female ([place])   .  Cancer of lung ([place])   . Hypertension   . Hypothyroidism   . Invasive ductal carcinoma of left breast ([place]) 1999  . Neutropenia ([place]) 06/09/2016    Past Surgical History:  Procedure Laterality Date  . BREAST BIOPSY     left axillary node dissection  . COLONOSCOPY  03/08/2003   NAT:FTDDUKGURK rectal polyps destroyed with the tip of the snare/Polyps at hepatic flexure, splenic flexure at 35 cm/Left-sided diverticula: unable to retrieve path  . COLONOSCOPY  09/12/2008   YHC:WCBJSE rectum and distal sigmoid diminutive polyps/scattered left sided diverticulum. hyperplastic  . COLONOSCOPY N/A 03/06/2013   GBT:DVVOHYW polyp-removed as described above; colonic diverticulosis. hyperplastic polyps. next TCS 03/2018  . FOOT SURGERY    . LUNG REMOVAL, PARTIAL     upper lobe    Social History   Social History  . Marital status: Widowed    Spouse name: N/A  . Number of children: N/A  . Years of education: N/A   Occupational History  . Not on file.   Social History Main Topics  . Smoking status: Former Smoker    Packs/day: 0.75    Years: 35.00    Quit date: 1993  . Smokeless tobacco: Never Used     Comment: smoke-free X 30 yeras  . Alcohol use No  . Drug use: No  . Sexual activity: Not on file  Other Topics Concern  . Not on file   Social History Narrative  . No narrative on file    No Known Allergies  Family History  Problem Relation Age of Onset  . Cancer Mother   . Cancer Sister   . Colon cancer Neg Hx     Prior to Admission medications   Medication Sig Start Date End Date Taking? Authorizing Provider  albuterol (PROVENTIL,VENTOLIN) 90 MCG/ACT inhaler Inhale 2 puffs into the lungs every 6 (six) hours as needed for wheezing.   Yes Everardo All, MD  Alum Hydroxide-Mag Carbonate (GAVISCON PO) Take 1 capsule by mouth as needed (gas relief).    Yes [provider]  aspirin 81 MG tablet Take 81 mg by mouth daily.     Yes [provider]  clotrimazole  (LOTRIMIN) 1 % cream Apply 1 application topically 2 (two) times daily. 05/07/17  Yes [provider]  ergocalciferol (VITAMIN D2) 50000 units capsule Take 1 capsule (50,000 Units total) by mouth once a week. 06/10/16  Yes Kefalas, Manon Hilding, PA-C  gabapentin (NEURONTIN) 600 MG tablet Take 600 mg by mouth 3 (three) times daily. 04/13/17  Yes [provider]  levothyroxine (SYNTHROID, LEVOTHROID) 88 MCG tablet Take 100 mcg by mouth daily.    Yes [provider]  metoprolol succinate (TOPROL-XL) 50 MG 24 hr tablet Take 50 mg by mouth daily. Take with or immediately following a meal.   Yes [provider]  Multiple Vitamin (MULTIVITAMIN WITH MINERALS) TABS tablet Take 1 tablet by mouth daily.   Yes [provider]  naproxen sodium (ANAPROX) 220 MG tablet Take 220 mg by mouth daily as needed.   Yes [provider]  telmisartan-hydrochlorothiazide (MICARDIS HCT) 80-12.5 MG per tablet Take 1 tablet by mouth daily.   Yes [provider]  VITAMIN D, ERGOCALCIFEROL, PO Take 1 capsule by mouth daily.   Yes [provider]    Physical Exam: Vitals:   05/31/17 1900 05/31/17 1930 05/31/17 1930 05/31/17 1954  BP: 101/71 (!) 125/94  (!) 148/56  Pulse: 70 73  65  Resp: (!) 25 19  19   Temp:    97.9 F (36.6 C)  TempSrc:    Oral  SpO2: 90% 92% 93% 95%  Weight:    93.3 kg (205 lb 11 oz)  Height:    5\' 7"  (1.702 m)     General: Appears calm and comfortable and is NAD Eyes:  PERRL, EOMI, normal lids, iris ENT:  grossly normal hearing, lips & tongue, mmm Neck:  no LAD, masses or thyromegaly Cardiovascular:  RRR, no m/r/g. No LE edema.  Respiratory:  CTA bilaterally,other than mild diffuse wheezing. Normal respiratory effort on 3L [place] O2. Abdomen:  soft, ntnd, NABS Skin:  no rash or induration seen on limited exam Musculoskeletal:  grossly normal tone BUE/BLE, good ROM, no bony abnormality Psychiatric:  grossly normal mood and affect,  speech fluent and appropriate, AOx3 Neurologic:  CN 2-12 grossly intact, moves all extremities in coordinated fashion, sensation intact  Labs on Admission: I have personally reviewed following labs and imaging studies  CBC:  Recent Labs Lab 05/31/17 1408  WBC 7.1  NEUTROABS 3.7  HGB 10.9*  HCT 34.2*  MCV 98.3  PLT 774*   Basic Metabolic Panel:  Recent Labs Lab 05/31/17 1408  NA 139  K 3.7  CL 100*  CO2 29  GLUCOSE 116*  BUN 16  CREATININE 0.99  CALCIUM 9.7   GFR: Estimated Creatinine Clearance: 52.3  mL/min (by C-G formula based on SCr of 0.99 mg/dL). Liver Function Tests:  Recent Labs Lab 05/31/17 1408  AST 24  ALT 24  ALKPHOS 58  BILITOT 1.4*  PROT 8.2*  ALBUMIN 3.6    Recent Labs Lab 05/31/17 1408  LIPASE 20   No results for input(s): AMMONIA in the last 168 hours. Coagulation Profile: No results for input(s): INR, PROTIME in the last 168 hours. Cardiac Enzymes:  Recent Labs Lab 05/31/17 1408  TROPONINI <0.03   BNP (last 3 results) No results for input(s): PROBNP in the last 8760 hours. HbA1C: No results for input(s): HGBA1C in the last 72 hours. CBG: No results for input(s): GLUCAP in the last 168 hours. Lipid Profile: No results for input(s): CHOL, HDL, LDLCALC, TRIG, CHOLHDL, LDLDIRECT in the last 72 hours. Thyroid Function Tests: No results for input(s): TSH, T4TOTAL, FREET4, T3FREE, THYROIDAB in the last 72 hours. Anemia Panel: No results for input(s): VITAMINB12, FOLATE, FERRITIN, TIBC, IRON, RETICCTPCT in the last 72 hours. Urine analysis:    Component Value Date/Time   COLORURINE YELLOW 05/31/2017 1352   APPEARANCEUR CLEAR 05/31/2017 1352   LABSPEC 1.008 05/31/2017 1352   PHURINE 7.0 05/31/2017 1352   GLUCOSEU NEGATIVE 05/31/2017 1352   HGBUR NEGATIVE 05/31/2017 1352   BILIRUBINUR NEGATIVE 05/31/2017 1352   KETONESUR NEGATIVE 05/31/2017 1352   PROTEINUR NEGATIVE 05/31/2017 1352   NITRITE NEGATIVE 05/31/2017 1352    LEUKOCYTESUR TRACE (A) 05/31/2017 1352    Creatinine Clearance: Estimated Creatinine Clearance: 52.3 mL/min (by C-G formula based on SCr of 0.99 mg/dL).  Sepsis Labs: @LABRCNTIP (procalcitonin:4,lacticidven:4) )No results found for this or any previous visit (from the past 240 hour(s)).   Radiological Exams on Admission: Dg Chest 2 View  Result Date: 05/31/2017 CLINICAL DATA:  Pain and incontinence. EXAM: CHEST  2 VIEW COMPARISON:  May 18, 2011 FINDINGS: Mild atelectasis in the bases.  No acute abnormalities. IMPRESSION: No active cardiopulmonary disease. Electronically Signed   By: Dorise Bullion III M.D   On: 05/31/2017 15:54   Ct Renal Stone Study  Result Date: 05/31/2017 CLINICAL DATA:  83 year old hypertensive female with urinary incontinence for 2 days. Abdominal pain yesterday but not today. Breast cancer and lung cancer post treatment. Initial encounter. EXAM: CT ABDOMEN AND PELVIS WITHOUT CONTRAST TECHNIQUE: Multidetector CT imaging of the abdomen and pelvis was performed following the standard protocol without IV contrast. COMPARISON:  PET-CT 03/13/2004. FINDINGS: Lower chest: Minimal scarring lung bases. Heart top-normal size with coronary artery calcifications. Small hiatal hernia. Hepatobiliary: Taking into account limitation by non contrast imaging, no hepatic lesion. Question tiny gallbladder polyp. Pancreas: Taking into account limitation by non contrast imaging, no mass or inflammation Spleen: Taking into account limitation by non contrast imaging, no mass or enlargement. Adrenals/Urinary Tract: Mild hyperplasia adrenal glands. No renal or ureteral obstructing stone or hydronephrosis. Taking into account limitation by non contrast imaging, no renal mass. Partially contracted urinary bladder without abnormality noted. Stomach/Bowel: Underdistended portions of bowel without extraluminal bowel inflammatory process or free air. Specifically, no inflammation surrounds the appendix or  scattered colonic diverticula. Vascular/Lymphatic: Infrarenal bilobed abdominal aortic aneurysm. Superior aspect measures up to 4 cm maximal transverse dimension and inferior aspect 3.4 cm. Atherosclerotic changes iliac arteries and femoral arteries. Left external iliac lymph node elongated but normal short axis dimension. Reproductive: No worrisome mass identified. Other: No bowel containing hernia. Musculoskeletal: No osseous destructive lesion. Mild degenerative changes lower lumbar spine. IMPRESSION: No urinary tract abnormality noted. Underdistended portions of bowel without extraluminal bowel  inflammatory process or free air. Specifically, no inflammation surrounds the appendix or scattered colonic diverticula. Infrarenal bilobed abdominal aortic aneurysm. Superior aspect measures up to 4 cm maximal transverse dimension and inferior aspect 3.4 cm. Superior aspect measured 2.9 cm in 2009. Recommend followup by ultrasound in 1 year. This recommendation follows ACR consensus guidelines: White Paper of the ACR Incidental Findings Committee II on Vascular Findings. J Am Coll Radiol 2013; 10:789-794. Question tiny gallbladder polyp. Mild adrenal gland hyperplasia. Electronically Signed   By: Genia Del M.D.   On: 05/31/2017 16:02    EKG: Independently reviewed.  NSR with rate 68; no evidence of acute ischemia  Assessment/Plan Principal Problem:   Acute respiratory failure with hypoxia (HCC) Active Problems:   Adenocarcinoma of left lung (HCC)   Hyperglycemia   CKD (chronic kidney disease) stage 3, GFR 30-59 ml/min   Normocytic anemia   COPD exacerbation (HCC)   AAA (abdominal aortic aneurysm) (HCC)   Acute respiratory failure with hypoxia due to COPD exacerbation in a patient with prior respiratory compromise from lung cancer and lobectomy -Patient's shortness of breath and hypoxia are most likely caused by acute COPD exacerbation. -She denies h/o COPD but has significant tobacco history and  prior imaging indicated that she has emphysema.  -Other differential diagnoses include, but less likely, pulmonary embolism (no tachycardia and patient improved with nebs; unable to obtain CTA now due to renal CT with contrast earlier, but if not improving with current treatment she will need chest CTA), pneumonia (although patient has  she does not have fever or leukocytosis and chest x-ray is not consistent with pneumonia), CHF (no known h/o CHF and no LE edema or JVD). -will admit patient to Med Surg bed  -Nebulizers: scheduled Duoneb and prn albuterol -Solu-Medrol 60 mg IV BID (she was given 125 mg IV in the ER) -No antibiotics for now as she has only 1/3 of the cardinal symptoms. -Lactate was normal on presentation and repeated and now is 2; will trend.  Sepsis is not considered likely at this time since she does not have other SIRS criteria besides hypoxia. -Her situation is complicated by her h/o adenocarcinoma s/p lobectomy.  Her last CT without contrast was in 8/17 and is due annually so she is due again next month. -Will obtain pulmonary consult given her fairly complicated history and lack of prior diagnosis of COPD.  Urinary incontinence -UA: trace LE, otherwise negative; culture pending -Patient actually presented for this reason and the respiratory problems were diagnosed incidentally. -ER physician Link Snuffer is that her incontinence is actually an inability to reach the bathroom in a timely manner due to hypoxia; this is a reasonable consideration. -UTI is also possible; culture is pending but the UA is unremarkable. -She also has h/o incontinence in the past and may have prolapse which was exacerbated by recent cough/SOB; consider further GYN evaluation if not improving.  AAA -4 cm aneurysm that is larger than in 2009. -Suggest f/u ultrasound in 1 year.  Hyperglycemia -Glucose 116 -May be stress response -Will follow with fasting AM labs -This is likely to worsen with steroids,  may need SSI coverage during this hospitalization.  CKD -BUN 16/Creatinine 0.99/GFR >60 - improved -Will trend with daily BMP.  Anemia -Hgb 10.9 -This appears to be new. -Will follow for now without intervention.     DVT prophylaxis:  Lovenox Code Status: Full - confirmed with patient Family Communication: None present Disposition Plan:  Home once clinically improved Consults called: Pulmonology; PT/OT/CM/SW/Nutrition  Admission status: Admit - It is my clinical opinion that admission to INPATIENT is reasonable and necessary because this patient will require at least 2 midnights in the hospital to treat this condition based on the medical complexity of the problems presented.  Given the aforementioned information, the predictability of an adverse outcome is felt to be significant.    Karmen Bongo MD [facility]  If 7PM-7AM, please contact night-coverage [email or web address] Password [email or web address]  05/31/2017, 8:12 PM

## 2017-06-01 DIAGNOSIS — J9601 Acute respiratory failure with hypoxia: Principal | ICD-10-CM

## 2017-06-01 DIAGNOSIS — I129 Hypertensive chronic kidney disease with stage 1 through stage 4 chronic kidney disease, or unspecified chronic kidney disease: Secondary | ICD-10-CM | POA: Diagnosis not present

## 2017-06-01 DIAGNOSIS — C3492 Malignant neoplasm of unspecified part of left bronchus or lung: Secondary | ICD-10-CM

## 2017-06-01 DIAGNOSIS — J441 Chronic obstructive pulmonary disease with (acute) exacerbation: Secondary | ICD-10-CM | POA: Diagnosis not present

## 2017-06-01 DIAGNOSIS — N183 Chronic kidney disease, stage 3 (moderate): Secondary | ICD-10-CM | POA: Diagnosis not present

## 2017-06-01 DIAGNOSIS — I714 Abdominal aortic aneurysm, without rupture: Secondary | ICD-10-CM | POA: Diagnosis not present

## 2017-06-01 DIAGNOSIS — J9621 Acute and chronic respiratory failure with hypoxia: Secondary | ICD-10-CM | POA: Diagnosis not present

## 2017-06-01 DIAGNOSIS — R739 Hyperglycemia, unspecified: Secondary | ICD-10-CM | POA: Diagnosis not present

## 2017-06-01 DIAGNOSIS — R300 Dysuria: Secondary | ICD-10-CM | POA: Diagnosis not present

## 2017-06-01 LAB — CBC
HCT: 33.6 % — ABNORMAL LOW (ref 36.0–46.0)
Hemoglobin: 10.5 g/dL — ABNORMAL LOW (ref 12.0–15.0)
MCH: 31 pg (ref 26.0–34.0)
MCHC: 31.3 g/dL (ref 30.0–36.0)
MCV: 99.1 fL (ref 78.0–100.0)
PLATELETS: 129 10*3/uL — AB (ref 150–400)
RBC: 3.39 MIL/uL — ABNORMAL LOW (ref 3.87–5.11)
RDW: 14.1 % (ref 11.5–15.5)
WBC: 4.3 10*3/uL (ref 4.0–10.5)

## 2017-06-01 LAB — GLUCOSE, CAPILLARY: GLUCOSE-CAPILLARY: 191 mg/dL — AB (ref 65–99)

## 2017-06-01 LAB — BLOOD GAS, ARTERIAL
ACID-BASE EXCESS: 0.1 mmol/L (ref 0.0–2.0)
BICARBONATE: 23.8 mmol/L (ref 20.0–28.0)
Drawn by: 270161
FIO2: 0.21
O2 Saturation: 87.3 %
PH ART: 7.33 — AB (ref 7.350–7.450)
PO2 ART: 57.5 mmHg — AB (ref 83.0–108.0)
Patient temperature: 37
pCO2 arterial: 49.3 mmHg — ABNORMAL HIGH (ref 32.0–48.0)

## 2017-06-01 LAB — BASIC METABOLIC PANEL
Anion gap: 11 (ref 5–15)
BUN: 28 mg/dL — AB (ref 6–20)
CALCIUM: 8.9 mg/dL (ref 8.9–10.3)
CO2: 29 mmol/L (ref 22–32)
CREATININE: 1.19 mg/dL — AB (ref 0.44–1.00)
Chloride: 97 mmol/L — ABNORMAL LOW (ref 101–111)
GFR calc Af Amer: 48 mL/min — ABNORMAL LOW (ref >60)
GFR, EST NON AFRICAN AMERICAN: 42 mL/min — AB (ref >60)
GLUCOSE: 162 mg/dL — AB (ref 65–99)
Potassium: 4.2 mmol/L (ref 3.5–5.1)
SODIUM: 137 mmol/L (ref 135–145)

## 2017-06-01 LAB — LACTIC ACID, PLASMA
LACTIC ACID, VENOUS: 3.4 mmol/L — AB (ref 0.5–1.9)
LACTIC ACID, VENOUS: 1.4 mmol/L (ref 0.5–1.9)
Lactic Acid, Venous: 2.1 mmol/L (ref 0.5–1.9)

## 2017-06-01 MED ORDER — GUAIFENESIN ER 600 MG PO TB12: 600.0000 mg | ORAL_TABLET | Freq: Two times a day (BID) | ORAL | 2 refills | Status: DC

## 2017-06-01 MED ORDER — AZITHROMYCIN 250 MG PO TABS: ORAL_TABLET | ORAL | 0 refills | Status: AC

## 2017-06-01 MED ORDER — PREDNISONE 10 MG PO TABS: ORAL_TABLET | ORAL | 0 refills | Status: DC

## 2017-06-01 MED ORDER — ALBUTEROL SULFATE (2.5 MG/3ML) 0.083% IN NEBU
2.5000 mg | INHALATION_SOLUTION | RESPIRATORY_TRACT | 12 refills | Status: DC | PRN
Start: 2017-06-01 — End: 2017-06-09

## 2017-06-01 NOTE — Evaluation (Signed)
Occupational Therapy Evaluation Patient Details Name: Lisa [name] MRN: [id number] DOB: April 07, 1934 Today's Date: 06/01/2017    History of Present Illness Lisa [name] is a 83 y.o. female with medical history significant of remote breast and lung cancer s/p lobectomy; hypothyroidism; HTN; and emphysema previously diagnosed on CT presenting for urinary incontinence.  Patient reports that she has been having trouble holding her urine.  She felt unable to manage it.  She has had this problem before but it has been a long time so she decided to come in to be evaluated.  She was found to be hypoxic in the ER.  She did notice some difficulty breathing for about 2 weeks.  She reports that she would cough but it wouldn't come up.  She would "get to coughing and I couldn't stop".  That went on for about 2 weeks.  She had a breathing treatment here and that helped a lot, "I don't feel wheezy like I did but I still feel some".  Coughing up clear sputum.  She has a h/o lung cancer and has had partial lobectomy, has had some difficulty breathing since.   Clinical Impression   Patient was sleeping in bed upon therapy arrival. Patient would awake to therapist's calling her name although quickly would fall back to sleep mid sentence. This occurred several times although eventually patient was able to fully awake and provide background information to therapist. Patient appears to be functioning at baseline and does not require any additional OT services at discharge.     Follow Up Recommendations  No OT follow up    Equipment Recommendations  None recommended by OT       Precautions / Restrictions Precautions Precautions: None Restrictions Weight Bearing Restrictions: No             ADL either performed or assessed with clinical judgement   ADL Overall ADL's : At baseline;Modified independent           General ADL Comments: Patient was able to complete self feeding at Mod I level while seated  in bed. Transfer not completed this date due to breakfast although she reports that she is completely independent at home and she has been up to the commode without issues.     Vision Baseline Vision/History: Wears glasses Wears Glasses: Reading only Patient Visual Report: No change from baseline Vision Assessment?: No apparent visual deficits            Pertinent Vitals/Pain Pain Assessment: No/denies pain     Hand Dominance Right   Extremity/Trunk Assessment Upper Extremity Assessment Upper Extremity Assessment: Overall WFL for tasks assessed   Lower Extremity Assessment Lower Extremity Assessment: Defer to PT evaluation       Communication Communication Communication: No difficulties   Cognition Arousal/Alertness: Lethargic;Awake/alert Behavior During Therapy: WFL for tasks assessed/performed Overall Cognitive Status: Within Functional Limits for tasks assessed     General Comments: Initially, patient's level of arousal was very lethargic. pt has a history of heavy sleeping per nuring and MD. Pt was able to wake up momentarily for 1 minute and would fall asleep mid sentence. Eventually, patient was able to fully awake and sit up in bed to eat breakfast.               Home Living Family/patient expects to be discharged to:: Private residence Living Arrangements: Children (daughter) Available Help at Discharge: Family;Available PRN/intermittently (daughter works during the day) Type of Home: House Home Access: Stairs to enter [employer] of  Steps: 4. Pt reports that she has 4 steps going into the kitchen with a railing on the left side going in.  Entrance Stairs-Rails: Right;Left;Can reach both Home Layout: Two level;Laundry or work area in Building surveyor of Steps: 14    Bathroom Shower/Tub: Occupational psychologist: None          Prior Functioning/Environment Level of Independence:  Independent              AM-PAC PT "6 Clicks" Daily Activity     Outcome Measure Help from another person eating meals?: None Help from another person taking care of personal grooming?: None Help from another person toileting, which includes using toliet, bedpan, or urinal?: None Help from another person bathing (including washing, rinsing, drying)?: None Help from another person to put on and taking off regular upper body clothing?: None Help from another person to put on and taking off regular lower body clothing?: None 6 Click Score: 24   End of Session    Activity Tolerance: Patient tolerated treatment well Patient left: in bed;with call bell/phone within reach  OT Visit Diagnosis: Muscle weakness (generalized) (M62.81)                Time: 2263-3354 OT Time Calculation (min): 16 min Charges:  OT General Charges $OT Visit: 1 Procedure OT Evaluation $OT Eval Low Complexity: 1 Procedure G-Codes: OT G-codes **NOT FOR INPATIENT CLASS** Functional Assessment Tool Used: AM-PAC 6 Clicks Daily Activity Functional Limitation: Self care Self Care Current Status (T6256): 0 percent impaired, limited or restricted Self Care Goal Status (L8937): 0 percent impaired, limited or restricted Self Care Discharge Status (D4287): 0 percent impaired, limited or restricted   Ailene Ravel, OTR/L,CBIS  [phone]   Murial Beam, Clarene Duke 06/01/2017, 9:52 AM

## 2017-06-01 NOTE — Progress Notes (Signed)
CRITICAL VALUE ALERT  Critical Value:  Lactic acid 2.1   Date & Time Notied: 0419  Provider Notified: Darrick Meigs, MD  Orders Received/Actions taken: No new orders/MD aware

## 2017-06-01 NOTE — Care Management Note (Signed)
Case Management Note  Patient Details  Name: Lisa [name] MRN: [id number] Date of Birth: 1934-02-17  Expected Discharge Date:      06/01/2017            Expected Discharge Plan:  Home/Self Care  In-House Referral:     Discharge planning Services  CM Consult  Post Acute Care Choice:  Durable Medical Equipment Choice offered to:  Patient  DME Arranged:  Oxygen DME Agency:  [place]:    [facility] Agency:     Status of Service:  Completed, signed off  If discussed at [place] of Stay Meetings, dates discussed:    Additional Comments: Patient discharging today. Will need oxygen and neb machine. Offered choice of DME agencies. [place] [facility] notified of need for oxygen and neb machine. Other treatments such as nebs and steroids have been tried and failed, patient will need oxygen for desaturation will ambulating.   Kaydence Menard, Chauncey [place], RN 06/01/2017, 3:22 PM

## 2017-06-01 NOTE — Evaluation (Addendum)
Physical Therapy Evaluation Patient Details Name: Lisa [name] MRN: [id number] DOB: 1934/04/08 Today's Date: 06/01/2017   History of Present Illness  Lisa [name] is a 83 y.o. female with medical history significant of remote breast and lung cancer s/p lobectomy; hypothyroidism; HTN; and emphysema previously diagnosed on CT presenting for urinary incontinence.  Patient reports that she has been having trouble holding her urine.  She felt unable to manage it.  She has had this problem before but it has been a long time so she decided to come in to be evaluated.  She was found to be hypoxic in the ER.  She did notice some difficulty breathing for about 2 weeks.  She reports that she would cough but it wouldn't come up.  She would "get to coughing and I couldn't stop".  That went on for about 2 weeks.  She had a breathing treatment here and that helped a lot, "I don't feel wheezy like I did but I still feel some".  Coughing up clear sputum.  She has a h/o lung cancer and has had partial lobectomy, has had some difficulty breathing since.  Clinical Impression  Pt admitted with above diagnosis. Pt currently with functional limitations due to the deficits listed below (see "PT Problem List"). Pt performing all functional mobility independently, reporting to feel at baseline in terms of balance, strength, effort required, and urinary incontinence. Pt received on room air upon entry with desaturation: 86% standing, bathing self at sink. Pt educated on signs/symptoms of hypoxia, which she is unfamiliar, and encouraged to obtain a pulse oximeter due to poor subjective awareness of hypoxia. AMB trial on room air is unsuccessful, pt desaturating to 80% SpO2. A 3-minute walk test is then performed on 2LPM with the following findings: Room air seated: SpO2: 86%, HR: 67, no SOB 2LPM seated: SpO2: 96%, HR: 69, no SOB 2LPM p 90sec AMB: SpO2: 96%, HR: 66, no SOB 2LPM p 180sec AMB: SpO2: 89%, HR 88, no SOB Pt is  encouraged to maintain 2LPM while in room until advised to return to room air. Patient is at functional baseline with decreased physiological activity tolerance, all education completed, and time is given to address all questions/concerns. No additional skilled PT services needed at this time, PT signing off. PT recommends daily ambulation ad lib or with nursing staff as needed to prevent deconditioning.      Follow Up Recommendations No PT follow up    Equipment Recommendations  Other (comment) (recommended patient acquire a pulse oximeter, and she is appropriate for supplemental O2 at this time. )    Recommendations for Other Services       Precautions / Restrictions Precautions Precautions: Fall Restrictions Weight Bearing Restrictions: No      Mobility  Bed Mobility               General bed mobility comments: received standing in room   Transfers Overall transfer level: Independent Equipment used: None                Ambulation/Gait Ambulation/Gait assistance: Independent Ambulation Distance (Feet): 300 Feet Assistive device: None Gait Pattern/deviations: WFL(Within Functional Limits) Gait velocity: 0.56m/s Gait velocity interpretation: <1.8 ft/sec, indicative of risk for recurrent falls General Gait Details: trialed on RA with desat to 80%, repeated on 2LPM, SpO2 WNL at 90seconds and 89% at 3 minutes   Stairs            Wheelchair Mobility    Modified Rankin (Stroke Patients Only)  Balance Overall balance assessment: Independent                                           Pertinent Vitals/Pain Pain Assessment: No/denies pain    Home Living Family/patient expects to be discharged to:: Private residence Living Arrangements: Children Available Help at Discharge: Family;Available PRN/intermittently Type of Home: House Home Access: Stairs to enter Entrance Stairs-Rails: Right;Left;Can reach both Entrance  Stairs-Number of Steps: 4. Pt reports that she has 4 steps going into the kitchen with a railing on the left side going in.  Home Layout: Two level;Laundry or work area in basement;Able to live on main level with bedroom/bathroom Home Equipment: Bedside commode;Cane - single point Additional Comments: carries her SPC when she goes into community but does not often need or use     Prior Function Level of Independence: Independent               Hand Dominance   Dominant Hand: Right    Extremity/Trunk Assessment   Upper Extremity Assessment Upper Extremity Assessment: Overall WFL for tasks assessed    Lower Extremity Assessment Lower Extremity Assessment: Overall WFL for tasks assessed       Communication   Communication: No difficulties  Cognition Arousal/Alertness: Awake/alert Behavior During Therapy: WFL for tasks assessed/performed Overall Cognitive Status: Within Functional Limits for tasks assessed                                 General Comments: Initially, patient's level of arousal was very lethargic. pt has a history of heavy sleeping per nuring and MD. Pt was able to wake up momentarily for 1 minute and would fall asleep mid sentence. Eventually, patient was able to fully awake and sit up in bed to eat breakfast.       General Comments      Exercises     Assessment/Plan    PT Assessment Patent does not need any further PT services  PT Problem List         PT Treatment Interventions      PT Goals (Current goals can be found in the Care Plan section)  Acute Rehab PT Goals PT Goal Formulation: All assessment and education complete, DC therapy    Frequency     Barriers to discharge        Co-evaluation               AM-PAC PT "6 Clicks" Daily Activity  Outcome Measure Difficulty turning over in bed (including adjusting bedclothes, sheets and blankets)?: None Difficulty moving from lying on back to sitting on the side of the  bed? : None Difficulty sitting down on and standing up from a chair with arms (e.g., wheelchair, bedside commode, etc,.)?: None Help needed moving to and from a bed to chair (including a wheelchair)?: None Help needed walking in hospital room?: None Help needed climbing 3-5 steps with a railing? : A Little 6 Click Score: 23    End of Session Equipment Utilized During Treatment: Oxygen Activity Tolerance: Patient tolerated treatment well Patient left: in bed (O2 donned with 13ft tubing ) Nurse Communication: Mobility status      Time: 6301-6010 PT Time Calculation (min) (ACUTE ONLY): 23 min   Charges:   PT Evaluation $PT Eval Low Complexity: 1 Low PT Treatments $  Therapeutic Activity: 8-22 mins   PT G Codes:        12:21 PM, 06-24-17 Etta Grandchild, PT, DPT Physical Therapist - [place] [phone] (Office)   Donnell Wion C 2017/06/24, 12:15 PM

## 2017-06-01 NOTE — Progress Notes (Signed)
CRITICAL VALUE ALERT  Critical Value:  Lactic acid 3.4  Date & Time Notied:  06/01/17 0120  Provider Notified: Darrick Meigs, MD  Orders Received/Actions taken: no new orders; MD aware

## 2017-06-01 NOTE — Discharge Summary (Signed)
Physician Discharge Summary  Lisa [name] BHA:[id number] DOB: [birth year] DOA: 05/31/2017  PCP: Celene Squibb, MD  Admit date: 05/31/2017 Discharge date: 06/01/2017  Admitted From: home Disposition:  home  Recommendations for Outpatient Follow-up:  1. Follow up with PCP in 1-2 weeks 2. Please obtain BMP/CBC in one week 3. Consider GYN referral if urinary incontinence has not improved.   Home Health: Equipment/Devices: oxygen at 3L, nebulizer  Discharge Condition: stable CODE STATUS:full code Diet recommendation: Heart Healthy   Brief/Interim Summary: This patient presents to the hospital with persistent cough, associated urinary incontinence and progressive shortness of breath. She was found to have acute respiratory failure with COPD exacerbation. She was treated with intravenous steroids, bronchodilators. Shortly after admission, her respiratory status had improved. She no longer had any wheezing and she was able to carry on a conversation without difficulty. Breathing was normal. She was ambulated and did not have any dyspnea. She was noted to become hypoxic on ambulation and required supplemental oxygen. Plan will be to discharge home with oxygen. She'll be continued on a prednisone taper, antibiotic course of bronchodilators. I suspect her urinary incontinence may be related related to weak pelvic floor. Urinalysis did not show any signs of infection. Consider outpatient GYN evaluation if her symptoms persist. Patient is otherwise stable for discharge home today.  Discharge Diagnoses:  Principal Problem:   Acute respiratory failure with hypoxia (HCC) Active Problems:   Adenocarcinoma of left lung (HCC)   Hyperglycemia   CKD (chronic kidney disease) stage 3, GFR 30-59 ml/min   Normocytic anemia   COPD exacerbation (HCC)   AAA (abdominal aortic aneurysm) (HCC)   Urinary incontinence    Discharge Instructions  Discharge Instructions    DME Nebulizer machine    Complete by:   As directed    Patient needs a nebulizer to treat with the following condition:  COPD (chronic obstructive pulmonary disease) (HCC)   Diet - low sodium heart healthy    Complete by:  As directed    Increase activity slowly    Complete by:  As directed      Allergies as of 06/01/2017   No Known Allergies     Medication List    TAKE these medications   albuterol (2.5 MG/3ML) 0.083% nebulizer solution Commonly known as:  PROVENTIL Take 3 mLs (2.5 mg total) by nebulization every 4 (four) hours as needed for wheezing or shortness of breath.   albuterol 90 MCG/ACT inhaler Commonly known as:  PROVENTIL,VENTOLIN Inhale 2 puffs into the lungs every 6 (six) hours as needed for wheezing.   aspirin 81 MG tablet Take 81 mg by mouth daily.   azithromycin 250 MG tablet Commonly known as:  ZITHROMAX Z-PAK Take 2 tablets (500 mg) on  Day 1,  followed by 1 tablet (250 mg) once daily on Days 2 through 5.   clotrimazole 1 % cream Commonly known as:  LOTRIMIN Apply 1 application topically 2 (two) times daily.   VITAMIN D (ERGOCALCIFEROL) PO Take 1 capsule by mouth daily.   ergocalciferol 50000 units capsule Commonly known as:  VITAMIN D2 Take 1 capsule (50,000 Units total) by mouth once a week.   gabapentin 600 MG tablet Commonly known as:  NEURONTIN Take 600 mg by mouth 3 (three) times daily.   GAVISCON PO Take 1 capsule by mouth as needed (gas relief).   guaiFENesin 600 MG 12 hr tablet Commonly known as:  MUCINEX Take 1 tablet (600 mg total) by mouth 2 (two) times daily.  levothyroxine 88 MCG tablet Commonly known as:  SYNTHROID, LEVOTHROID Take 100 mcg by mouth daily.   metoprolol succinate 50 MG 24 hr tablet Commonly known as:  TOPROL-XL Take 50 mg by mouth daily. Take with or immediately following a meal.   multivitamin with minerals Tabs tablet Take 1 tablet by mouth daily.   naproxen sodium 220 MG tablet Commonly known as:  ANAPROX Take 220 mg by mouth daily as  needed.   predniSONE 10 MG tablet Commonly known as:  DELTASONE Take 40mg  po daily for 2 days then 30mg  daily for 2 days then 20mg  daily for 2 days then 10mg  daily for 2 days then stop   telmisartan-hydrochlorothiazide 80-12.5 MG tablet Commonly known as:  MICARDIS HCT Take 1 tablet by mouth daily.            Durable Medical Equipment        Start     Ordered   06/01/17 1523  For home use only DME oxygen  Once    Question Answer Comment  Mode or (Route) Nasal cannula   Liters per Minute 3   Frequency Continuous (stationary and portable oxygen unit needed)   Oxygen conserving device Yes   Oxygen delivery system Gas      06/01/17 1522   06/01/17 0000  DME Nebulizer machine    Question:  Patient needs a nebulizer to treat with the following condition  Answer:  COPD (chronic obstructive pulmonary disease) (DISH)   06/01/17 1526      No Known Allergies  Consultations:  pulmonology   Procedures/Studies: Dg Chest 2 View  Result Date: 05/31/2017 CLINICAL DATA:  Pain and incontinence. EXAM: CHEST  2 VIEW COMPARISON:  May 18, 2011 FINDINGS: Mild atelectasis in the bases.  No acute abnormalities. IMPRESSION: No active cardiopulmonary disease. Electronically Signed   By: Dorise Bullion III M.D   On: 05/31/2017 15:54   Ct Renal Stone Study  Result Date: 05/31/2017 CLINICAL DATA:  83 year old hypertensive female with urinary incontinence for 2 days. Abdominal pain yesterday but not today. Breast cancer and lung cancer post treatment. Initial encounter. EXAM: CT ABDOMEN AND PELVIS WITHOUT CONTRAST TECHNIQUE: Multidetector CT imaging of the abdomen and pelvis was performed following the standard protocol without IV contrast. COMPARISON:  PET-CT 03/13/2004. FINDINGS: Lower chest: Minimal scarring lung bases. Heart top-normal size with coronary artery calcifications. Small hiatal hernia. Hepatobiliary: Taking into account limitation by non contrast imaging, no hepatic lesion. Question  tiny gallbladder polyp. Pancreas: Taking into account limitation by non contrast imaging, no mass or inflammation Spleen: Taking into account limitation by non contrast imaging, no mass or enlargement. Adrenals/Urinary Tract: Mild hyperplasia adrenal glands. No renal or ureteral obstructing stone or hydronephrosis. Taking into account limitation by non contrast imaging, no renal mass. Partially contracted urinary bladder without abnormality noted. Stomach/Bowel: Underdistended portions of bowel without extraluminal bowel inflammatory process or free air. Specifically, no inflammation surrounds the appendix or scattered colonic diverticula. Vascular/Lymphatic: Infrarenal bilobed abdominal aortic aneurysm. Superior aspect measures up to 4 cm maximal transverse dimension and inferior aspect 3.4 cm. Atherosclerotic changes iliac arteries and femoral arteries. Left external iliac lymph node elongated but normal short axis dimension. Reproductive: No worrisome mass identified. Other: No bowel containing hernia. Musculoskeletal: No osseous destructive lesion. Mild degenerative changes lower lumbar spine. IMPRESSION: No urinary tract abnormality noted. Underdistended portions of bowel without extraluminal bowel inflammatory process or free air. Specifically, no inflammation surrounds the appendix or scattered colonic diverticula. Infrarenal bilobed abdominal aortic aneurysm. Superior  aspect measures up to 4 cm maximal transverse dimension and inferior aspect 3.4 cm. Superior aspect measured 2.9 cm in 2009. Recommend followup by ultrasound in 1 year. This recommendation follows ACR consensus guidelines: White Paper of the ACR Incidental Findings Committee II on Vascular Findings. J Am Coll Radiol 2013; 10:789-794. Question tiny gallbladder polyp. Mild adrenal gland hyperplasia. Electronically Signed   By: Genia Del M.D.   On: 05/31/2017 16:02       Subjective: Patient is feeling better. Shortness of breath  improving. She is able to ambulate without difficulty   Discharge Exam: Vitals:   06/01/17 0946 06/01/17 0950  BP:    Pulse: 66 86  Resp:    Temp:     Vitals:   06/01/17 0716 06/01/17 0946 06/01/17 0950 06/01/17 1333  BP:      Pulse:  66 86   Resp:      Temp:      TempSrc:      SpO2: 96% (!) 86% (!) 89% 95%  Weight:      Height:        General: Pt is alert, awake, not in acute distress Cardiovascular: RRR, S1/S2 +, no rubs, no gallops Respiratory: CTA bilaterally, no wheezing, no rhonchi Abdominal: Soft, NT, ND, bowel sounds + Extremities: no edema, no cyanosis    The results of significant diagnostics from this hospitalization (including imaging, microbiology, ancillary and laboratory) are listed below for reference.     Microbiology: No results found for this or any previous visit (from the past 240 hour(s)).   Labs: BNP (last 3 results)  Recent Labs  05/31/17 1408  BNP 132.4*   Basic Metabolic Panel:  Recent Labs Lab 05/31/17 1408 06/01/17 0549  NA 139 137  K 3.7 4.2  CL 100* 97*  CO2 29 29  GLUCOSE 116* 162*  BUN 16 28*  CREATININE 0.99 1.19*  CALCIUM 9.7 8.9   Liver Function Tests:  Recent Labs Lab 05/31/17 1408  AST 24  ALT 24  ALKPHOS 58  BILITOT 1.4*  PROT 8.2*  ALBUMIN 3.6    Recent Labs Lab 05/31/17 1408  LIPASE 20   No results for input(s): AMMONIA in the last 168 hours. CBC:  Recent Labs Lab 05/31/17 1408 06/01/17 0549  WBC 7.1 4.3  NEUTROABS 3.7  --   HGB 10.9* 10.5*  HCT 34.2* 33.6*  MCV 98.3 99.1  PLT 131* 129*   Cardiac Enzymes:  Recent Labs Lab 05/31/17 1408  TROPONINI <0.03   BNP: Invalid input(s): POCBNP CBG:  Recent Labs Lab 06/01/17 0225  GLUCAP 191*   D-Dimer No results for input(s): DDIMER in the last 72 hours. Hgb A1c No results for input(s): HGBA1C in the last 72 hours. Lipid Profile No results for input(s): CHOL, HDL, LDLCALC, TRIG, CHOLHDL, LDLDIRECT in the last 72  hours. Thyroid function studies  Recent Labs  05/31/17 1408  TSH 0.544   Anemia work up No results for input(s): VITAMINB12, FOLATE, FERRITIN, TIBC, IRON, RETICCTPCT in the last 72 hours. Urinalysis    Component Value Date/Time   COLORURINE YELLOW 05/31/2017 1352   APPEARANCEUR CLEAR 05/31/2017 1352   LABSPEC 1.008 05/31/2017 1352   PHURINE 7.0 05/31/2017 1352   GLUCOSEU NEGATIVE 05/31/2017 1352   HGBUR NEGATIVE 05/31/2017 1352   BILIRUBINUR NEGATIVE 05/31/2017 1352   KETONESUR NEGATIVE 05/31/2017 1352   PROTEINUR NEGATIVE 05/31/2017 1352   NITRITE NEGATIVE 05/31/2017 1352   LEUKOCYTESUR TRACE (A) 05/31/2017 1352   Sepsis Labs Invalid input(s): PROCALCITONIN,  WBC,  LACTICIDVEN Microbiology No results found for this or any previous visit (from the past 240 hour(s)).   Time coordinating discharge: Over 30 minutes  SIGNED:   Kathie Dike, MD  [facility] 06/01/2017, 3:26 PM Pager   If 7PM-7AM, please contact night-coverage [email or web address] Password [email or web address]

## 2017-06-01 NOTE — Progress Notes (Signed)
Initial Nutrition Assessment  DOCUMENTATION CODES:  Obesity class I    INTERVENTION:  Regular diet    NUTRITION DIAGNOSIS:  Increased protein and energy needs related to breathing difficulty    GOAL:  Pt to meet >/= 90% of their estimated nutrition needs      MONITOR:  Po intake, labs and wt trends     REASON FOR ASSESSMENT:   Consult COPD Protocol  ASSESSMENT:   Lisa [name] has a hx of breast and lung cancer. She presents with c/o urinary incontinance., and hypoxic.    The patient follows a  Regular diet at home. She is able to feed herself and prepare her own food. She has no changes in appetite or weight. Her weight has bee stable for > 1 year.  Nutrition-Focused physical exam: within normal parameters for age.    Recent Labs Lab 05/31/17 1408 06/01/17 0549  NA 139 137  K 3.7 4.2  CL 100* 97*  CO2 29 29  BUN 16 28*  CREATININE 0.99 1.19*  CALCIUM 9.7 8.9  GLUCOSE 116* 162*   Labs and meds reviewed   Diet Order:  Diet regular Room service appropriate? Yes; Fluid consistency: Thin  Skin:  Reviewed, no issues  Last BM:  7/30   Height:   Ht Readings from Last 1 Encounters:  05/31/17 5\' 7"  (1.702 m)    Weight:   Wt Readings from Last 1 Encounters:  05/31/17 205 lb 11 oz (93.3 kg)    Ideal Body Weight:  61.3 kg  BMI:  Body mass index is 32.22 kg/m.  Estimated Nutritional Needs:   Kcal:  1700-1800   Protein:  90-95 gr  Fluid:  1.7-1.8 liters daily  EDUCATION NEEDS:  None identified at this time    Colman Cater Lisa,RD,CSG,LDN Office: #[phone] Pager: [phone]

## 2017-06-01 NOTE — Consult Note (Signed)
Consult requested by: [facility] Consult requested for: Respiratory failure  HPI: This is an 83 year old who came to the emergency department complaining of urinary incontinence. She was found to be hypoxic and appeared to be short of breath particularly with exertion. Her incontinence was thought to be related to those problems rather than to a primary urinary issue. She was admitted started on treatment for COPD exacerbation. She is very sleepy this morning. Staff reports that she is known to be a very heavy sleeper but she doesn't wake up soon she'll need to have a blood gas to rule out elevation of PCO2. She has not had a blood gas done yet. I can't really get any history out of her at this time although she does wake up enough for me to introduce myself. In addition to COPD which apparently she denied that she had she has history of breast cancer history of adenocarcinoma of the left lung with lung resection hypertension, hypothyroidism. X-rays show definite COPD but as mentioned she said when she came into the hospital but she had never been told she had that. Her lactic acid level has been somewhat elevated but is improving. She did not appear to be septic.  Past Medical History:  Diagnosis Date  . Adenocarcinoma of left lung ([place]) 2006  . Arthritis   . Asthma   . Cancer of breast, female ([place])   . Cancer of lung ([place])   . Hypertension   . Hypothyroidism   . Invasive ductal carcinoma of left breast ([place]) 1999  . Neutropenia ([place]) 06/09/2016     Family History  Problem Relation Age of Onset  . Cancer Mother   . Cancer Sister   . Colon cancer Neg Hx      Social History   Social History  . Marital status: Widowed    Spouse name: N/A  . Number of children: N/A  . Years of education: N/A   Social History Main Topics  . Smoking status: Former Smoker    Packs/day: 0.75    Years: 35.00    Quit date: 1993  . Smokeless tobacco: Never Used     Comment: smoke-free X 30 yeras   . Alcohol use No  . Drug use: No  . Sexual activity: Not Asked   Other Topics Concern  . None   Social History Narrative  . None     ROS: Unobtainable    Objective: Vital signs in last 24 hours: Temp:  [97.9 F (36.6 C)-98.2 F (36.8 C)] 97.9 F (36.6 C) (07/31 0600) Pulse Rate:  [56-90] 57 (07/31 0600) Resp:  [13-26] 20 (07/31 0600) BP: (101-150)/(50-94) 108/50 (07/31 0600) SpO2:  [83 %-99 %] 96 % (07/31 0716) Weight:  [91.6 kg (202 lb)-93.3 kg (205 lb 11 oz)] 93.3 kg (205 lb 11 oz) (07/30 1954) Weight change:  Last BM Date: 05/31/17  Intake/Output from previous day: 07/30 0701 - 07/31 0700 In: -  Out: 200 [Urine:200]  PHYSICAL EXAM Constitutional: She is comfortable in appearance sleepy arousable but then goes back to sleep very quickly. Eyes: Pupils react ears nose mouth and throat: Her mucous membranes are somewhat dry. Cardiovascular: Her heart is regular with normal heart sounds respiratory: Respiratory effort mildly increased. She has prolonged expiratory phase. Gastrointestinal: Her abdomen is soft with no masses. Skin: Warm and dry neurological: Cannot assess. Psychiatric: Cannot assess musculoskeletal: Cannot assess  Lab Results: Basic Metabolic Panel:  Recent Labs  05/31/17 1408 06/01/17 0549  NA 139 137  K  3.7 4.2  CL 100* 97*  CO2 29 29  GLUCOSE 116* 162*  BUN 16 28*  CREATININE 0.99 1.19*  CALCIUM 9.7 8.9   Liver Function Tests:  Recent Labs  05/31/17 1408  AST 24  ALT 24  ALKPHOS 58  BILITOT 1.4*  PROT 8.2*  ALBUMIN 3.6    Recent Labs  05/31/17 1408  LIPASE 20   No results for input(s): AMMONIA in the last 72 hours. CBC:  Recent Labs  05/31/17 1408 06/01/17 0549  WBC 7.1 4.3  NEUTROABS 3.7  --   HGB 10.9* 10.5*  HCT 34.2* 33.6*  MCV 98.3 99.1  PLT 131* 129*   Cardiac Enzymes:  Recent Labs  05/31/17 1408  TROPONINI <0.03   BNP: No results for input(s): PROBNP in the last 72 hours. D-Dimer: No results for  input(s): DDIMER in the last 72 hours. CBG:  Recent Labs  06/01/17 0225  GLUCAP 191*   Hemoglobin A1C: No results for input(s): HGBA1C in the last 72 hours. Fasting Lipid Panel: No results for input(s): CHOL, HDL, LDLCALC, TRIG, CHOLHDL, LDLDIRECT in the last 72 hours. Thyroid Function Tests:  Recent Labs  05/31/17 1408  TSH 0.544   Anemia Panel: No results for input(s): VITAMINB12, FOLATE, FERRITIN, TIBC, IRON, RETICCTPCT in the last 72 hours. Coagulation:  Recent Labs  05/31/17 1408  LABPROT 15.3*  INR 1.20   Urine Drug Screen: Drugs of Abuse  No results found for: LABOPIA, COCAINSCRNUR, LABBENZ, AMPHETMU, THCU, LABBARB  Alcohol Level: No results for input(s): ETH in the last 72 hours. Urinalysis:  Recent Labs  05/31/17 1352  COLORURINE YELLOW  LABSPEC 1.008  PHURINE 7.0  GLUCOSEU NEGATIVE  HGBUR NEGATIVE  BILIRUBINUR NEGATIVE  KETONESUR NEGATIVE  PROTEINUR NEGATIVE  NITRITE NEGATIVE  LEUKOCYTESUR TRACE*   Misc. Labs:   ABGS: No results for input(s): PHART, PO2ART, TCO2, HCO3 in the last 72 hours.  Invalid input(s): PCO2   MICROBIOLOGY: No results found for this or any previous visit (from the past 240 hour(s)).  Studies/Results: Dg Chest 2 View  Result Date: 05/31/2017 CLINICAL DATA:  Pain and incontinence. EXAM: CHEST  2 VIEW COMPARISON:  May 18, 2011 FINDINGS: Mild atelectasis in the bases.  No acute abnormalities. IMPRESSION: No active cardiopulmonary disease. Electronically Signed   By: Dorise Bullion III M.D   On: 05/31/2017 15:54   Ct Renal Stone Study  Result Date: 05/31/2017 CLINICAL DATA:  83 year old hypertensive female with urinary incontinence for 2 days. Abdominal pain yesterday but not today. Breast cancer and lung cancer post treatment. Initial encounter. EXAM: CT ABDOMEN AND PELVIS WITHOUT CONTRAST TECHNIQUE: Multidetector CT imaging of the abdomen and pelvis was performed following the standard protocol without IV contrast.  COMPARISON:  PET-CT 03/13/2004. FINDINGS: Lower chest: Minimal scarring lung bases. Heart top-normal size with coronary artery calcifications. Small hiatal hernia. Hepatobiliary: Taking into account limitation by non contrast imaging, no hepatic lesion. Question tiny gallbladder polyp. Pancreas: Taking into account limitation by non contrast imaging, no mass or inflammation Spleen: Taking into account limitation by non contrast imaging, no mass or enlargement. Adrenals/Urinary Tract: Mild hyperplasia adrenal glands. No renal or ureteral obstructing stone or hydronephrosis. Taking into account limitation by non contrast imaging, no renal mass. Partially contracted urinary bladder without abnormality noted. Stomach/Bowel: Underdistended portions of bowel without extraluminal bowel inflammatory process or free air. Specifically, no inflammation surrounds the appendix or scattered colonic diverticula. Vascular/Lymphatic: Infrarenal bilobed abdominal aortic aneurysm. Superior aspect measures up to 4 cm maximal transverse dimension  and inferior aspect 3.4 cm. Atherosclerotic changes iliac arteries and femoral arteries. Left external iliac lymph node elongated but normal short axis dimension. Reproductive: No worrisome mass identified. Other: No bowel containing hernia. Musculoskeletal: No osseous destructive lesion. Mild degenerative changes lower lumbar spine. IMPRESSION: No urinary tract abnormality noted. Underdistended portions of bowel without extraluminal bowel inflammatory process or free air. Specifically, no inflammation surrounds the appendix or scattered colonic diverticula. Infrarenal bilobed abdominal aortic aneurysm. Superior aspect measures up to 4 cm maximal transverse dimension and inferior aspect 3.4 cm. Superior aspect measured 2.9 cm in 2009. Recommend followup by ultrasound in 1 year. This recommendation follows ACR consensus guidelines: White Paper of the ACR Incidental Findings Committee II on  Vascular Findings. J Am Coll Radiol 2013; 10:789-794. Question tiny gallbladder polyp. Mild adrenal gland hyperplasia. Electronically Signed   By: Genia Del M.D.   On: 05/31/2017 16:02    Medications:  Prior to Admission:  Prescriptions Prior to Admission  Medication Sig Dispense Refill Last Dose  . albuterol (PROVENTIL,VENTOLIN) 90 MCG/ACT inhaler Inhale 2 puffs into the lungs every 6 (six) hours as needed for wheezing.   05/31/2017 at Unknown time  . Alum Hydroxide-Mag Carbonate (GAVISCON PO) Take 1 capsule by mouth as needed (gas relief).    Past Month at Unknown time  . aspirin 81 MG tablet Take 81 mg by mouth daily.     05/31/2017 at 0900  . clotrimazole (LOTRIMIN) 1 % cream Apply 1 application topically 2 (two) times daily.   Past Week at Unknown time  . ergocalciferol (VITAMIN D2) 50000 units capsule Take 1 capsule (50,000 Units total) by mouth once a week. 12 capsule 0 05/30/2017 at Unknown time  . gabapentin (NEURONTIN) 600 MG tablet Take 600 mg by mouth 3 (three) times daily.   05/31/2017 at 0900  . levothyroxine (SYNTHROID, LEVOTHROID) 88 MCG tablet Take 100 mcg by mouth daily.    05/31/2017 at 0900  . metoprolol succinate (TOPROL-XL) 50 MG 24 hr tablet Take 50 mg by mouth daily. Take with or immediately following a meal.   05/31/2017 at 0900  . Multiple Vitamin (MULTIVITAMIN WITH MINERALS) TABS tablet Take 1 tablet by mouth daily.   05/31/2017 at 0900  . naproxen sodium (ANAPROX) 220 MG tablet Take 220 mg by mouth daily as needed.   Past Month at Unknown time  . telmisartan-hydrochlorothiazide (MICARDIS HCT) 80-12.5 MG per tablet Take 1 tablet by mouth daily.   05/31/2017 at 0900  . VITAMIN D, ERGOCALCIFEROL, PO Take 1 capsule by mouth daily.   05/31/2017 at 0900   Scheduled: . aspirin EC  81 mg Oral Daily  . docusate sodium  100 mg Oral BID  . enoxaparin (LOVENOX) injection  40 mg Subcutaneous Q24H  . gabapentin  600 mg Oral TID  . irbesartan  300 mg Oral Daily   And  .  hydrochlorothiazide  12.5 mg Oral Daily  . ipratropium-albuterol  3 mL Nebulization Q6H  . levothyroxine  100 mcg Oral QAC breakfast  . mouth rinse  15 mL Mouth Rinse BID  . methylPREDNISolone (SOLU-MEDROL) injection  60 mg Intravenous Q12H  . metoprolol succinate  50 mg Oral QPC breakfast   Continuous:  JSE:[id number] **OR** acetaminophen, albuterol, ondansetron **OR** ondansetron (ZOFRAN) IV  Assesment: She has acute hypoxic respiratory failure. She has COPD exacerbation. She has history of adenocarcinoma of the left lung. She did not have a blood gas done but this morning she is very sleepy. She has a history of being  a very deep sleeper and being difficult to arouse at baseline but if she doesn't come around syringes will need blood gas to make sure she's not got CO2 narcosis. Principal Problem:   Acute respiratory failure with hypoxia (HCC) Active Problems:   Adenocarcinoma of left lung (HCC)   Hyperglycemia   CKD (chronic kidney disease) stage 3, GFR 30-59 ml/min   Normocytic anemia   COPD exacerbation (HCC)   AAA (abdominal aortic aneurysm) (HCC)   Urinary incontinence    Plan: Blood gas if she doesn't become more arousable seen continue with IV steroids. She'll need outpatient PFT. She'll need maintenance inhaler at home which she's not using.    LOS: 1 day   Chantal Worthey L 06/01/2017, 8:24 AM

## 2017-06-01 NOTE — Progress Notes (Signed)
SATURATION QUALIFICATIONS: (This note is used to comply with regulatory documentation for home oxygen)  Patient Saturations on Room Air at Rest = 92%  Patient Saturations on Room Air while Ambulating = 82%  Patient Saturations on 3 Liters of oxygen while Ambulating = 99%

## 2017-06-01 NOTE — Progress Notes (Signed)
LCSW aware of consult for COPD Gold Protocol Patient does not meet criteria for screening due to having only 1 admission in last 6 months.  COPD gold patients have had 3 or more admissions to hospital in last 6 months.  Will be available if needs arise during hospitalization, please re-consult if needed.  Lane Hacker, MSW Clinical Social Work: [employer] Coverage for :  [phone]

## 2017-06-01 NOTE — Progress Notes (Signed)
Patient discharged home with Home oxygen tank. Patient discharged with IV removed and site intact.

## 2017-06-02 LAB — URINE CULTURE: Culture: <10000 — AB

## 2017-06-09 ENCOUNTER — Encounter (HOSPITAL_COMMUNITY): Payer: Medicare Other | Attending: Oncology | Admitting: Adult Health

## 2017-06-09 ENCOUNTER — Encounter (HOSPITAL_COMMUNITY): Payer: Self-pay | Admitting: Adult Health

## 2017-06-09 ENCOUNTER — Ambulatory Visit (HOSPITAL_COMMUNITY): Payer: Medicare Other | Admitting: Adult Health

## 2017-06-09 VITALS — BP 117/53 | HR 71 | Temp 97.7°F | Resp 18 | Wt 208.1 lb

## 2017-06-09 DIAGNOSIS — Z85118 Personal history of other malignant neoplasm of bronchus and lung: Secondary | ICD-10-CM

## 2017-06-09 DIAGNOSIS — C3492 Malignant neoplasm of unspecified part of left bronchus or lung: Secondary | ICD-10-CM

## 2017-06-09 DIAGNOSIS — Z1231 Encounter for screening mammogram for malignant neoplasm of breast: Secondary | ICD-10-CM

## 2017-06-09 DIAGNOSIS — Z853 Personal history of malignant neoplasm of breast: Secondary | ICD-10-CM

## 2017-06-09 DIAGNOSIS — C50912 Malignant neoplasm of unspecified site of left female breast: Secondary | ICD-10-CM

## 2017-06-09 DIAGNOSIS — D709 Neutropenia, unspecified: Secondary | ICD-10-CM | POA: Diagnosis not present

## 2017-06-09 NOTE — Patient Instructions (Addendum)
[facility] at [facility] Discharge Instructions  RECOMMENDATIONS MADE BY THE CONSULTANT AND ANY TEST RESULTS WILL BE SENT TO YOUR REFERRING PHYSICIAN  You saw Mike Craze, NP, today. Your CT scan has been ordered and will be scheduled. Your mammogram is due in December of this year. Return in 1 year for follow up.   See Amy at checkout for appointments.   Thank you for choosing [facility] at [facility] to provide your oncology and hematology care.  To afford each patient quality time with our provider, please arrive at least 15 minutes before your scheduled appointment time.    If you have a lab appointment with the [facility] please come in thru the  Main Entrance and check in at the main information desk  You need to re-schedule your appointment should you arrive 10 or more minutes late.  We strive to give you quality time with our providers, and arriving late affects you and other patients whose appointments are after yours.  Also, if you no show three or more times for appointments you may be dismissed from the clinic at the providers discretion.     Again, thank you for choosing [facility].  Our hope is that these requests will decrease the amount of time that you wait before being seen by our physicians.       _____________________________________________________________  Should you have questions after your visit to [facility], please contact our office at [phone] between the hours of 8:30 a.m. and 4:30 p.m.  Voicemails left after 4:30 p.m. will not be returned until the following business day.  For prescription refill requests, have your pharmacy contact our office.       Resources For Cancer Patients and their Caregivers ? American Cancer Society: Can assist with transportation, wigs, general needs, runs Look Good Feel Better.        580-589-7122 ? Cancer Care: Provides financial  assistance, online support groups, medication/co-pay assistance.  1-800-813-HOPE 506 671 9764) ? [facility] Assists [place] cancer patients and their families through emotional , educational and financial support.  4303214198 ? [place] DSS Where to apply for food stamps, Medicaid and utility assistance. 7347899726 ? [facility]: Transportation to medical appointments. (254) 034-4802 ? Social Security Administration: May apply for disability if have a Stage IV cancer. 906-659-7917 865-122-4240 ? [employer], Disability and Transit Services: Assists with nutrition, care and transit needs. [place] Support Programs: @10RELATIVEDAYS @ > Cancer Support Group  2nd Tuesday of the month 1pm-2pm, Journey Room  > Creative Journey  3rd Tuesday of the month 1130am-1pm, Journey Room  > Look Good Feel Better  1st Wednesday of the month 10am-12 noon, Journey Room (Call [place] to register [phone])

## 2017-06-11 DIAGNOSIS — Z6834 Body mass index (BMI) 34.0-34.9, adult: Secondary | ICD-10-CM | POA: Diagnosis not present

## 2017-06-11 DIAGNOSIS — D508 Other iron deficiency anemias: Secondary | ICD-10-CM | POA: Diagnosis not present

## 2017-06-11 DIAGNOSIS — J9601 Acute respiratory failure with hypoxia: Secondary | ICD-10-CM | POA: Diagnosis not present

## 2017-06-11 DIAGNOSIS — J441 Chronic obstructive pulmonary disease with (acute) exacerbation: Secondary | ICD-10-CM | POA: Diagnosis not present

## 2017-06-11 DIAGNOSIS — I714 Abdominal aortic aneurysm, without rupture: Secondary | ICD-10-CM | POA: Diagnosis not present

## 2017-06-13 NOTE — Progress Notes (Signed)
Lisa [name], [place] [address]   CLINIC:  Medical Oncology/Hematology  PCP:  Celene Squibb, MD [place] [address] [phone]   REASON FOR VISIT:  Follow-up for Stage IA invasive ductal carcinoma of (L) breast, ER+/PR+ AND Stage I adenocarcinoma of LUL lung AND Leukopenia with neutropenia   CURRENT THERAPY: Surveillance per NCCN Guidelines    BRIEF ONCOLOGIC HISTORY:    Adenocarcinoma of left lung ([place])   06/23/2016 Imaging    CT chest- Stable exam. No new or progressive findings. Previously described tiny perifissural nodules at the base of the right major fissure are stable and likely represent subpleural lymph nodes.        HISTORY OF PRESENT ILLNESS:  (From Kirby Crigler, PA-C's last note on 06/09/16)     INTERVAL HISTORY:  Lisa [name] 83 y.o. female returns for follow-up for history of left breast cancer, left lung cancer, and decreased WBCs.   Overall, she tells me she has been feeling very well. Appetite and energy levels 75%. She is continuing to recover from recent hospitalization from 05/31/17-06/01/17 for COPD exacerbation. Her breathing is much improved since being discharged.    Denies any changes to her breasts.  Last mammogram was in 10/2016 and negative. She has not had her annual CT chest without contrast for lung cancer surveillance yet for this year; it is due sometime before the end of this month.   She has some urinary concerns, which she plans to see Dr. Nevada Crane (PCP) about this soon; denies any dysuria or hematuria.  She has some peripheral neuropathy to her feet, hands, and arms which has been chronic and stable.    Otherwise, she is largely without complaints today.     REVIEW OF SYSTEMS:  Review of Systems  Constitutional: Negative.   HENT:  Negative.   Eyes: Negative.   Cardiovascular: Negative.  Negative for chest pain and leg swelling.  Gastrointestinal: Negative.  Negative for blood in  stool, constipation, diarrhea, nausea and vomiting.  Endocrine: Negative.   Genitourinary: Positive for bladder incontinence.   Musculoskeletal: Negative.   Skin: Negative.   Neurological: Positive for numbness.  Hematological: Negative.   Psychiatric/Behavioral: Negative.      PAST MEDICAL/SURGICAL HISTORY:  Past Medical History:  Diagnosis Date  . Adenocarcinoma of left lung ([place]) 2006  . Arthritis   . Asthma   . Cancer of breast, female ([place])   . Cancer of lung ([place])   . Hypertension   . Hypothyroidism   . Invasive ductal carcinoma of left breast ([place]) 1999  . Neutropenia ([place]) 06/09/2016   Past Surgical History:  Procedure Laterality Date  . BREAST BIOPSY     left axillary node dissection  . COLONOSCOPY  03/08/2003   QTM:[id number] rectal polyps destroyed with the tip of the snare/Polyps at hepatic flexure, splenic flexure at 35 cm/Left-sided diverticula: unable to retrieve path  . COLONOSCOPY  09/12/2008   YBW:LSLHTD rectum and distal sigmoid diminutive polyps/scattered left sided diverticulum. hyperplastic  . COLONOSCOPY N/A 03/06/2013   SKA:JGOTLXB polyp-removed as described above; colonic diverticulosis. hyperplastic polyps. next TCS 03/2018  . FOOT SURGERY    . LUNG REMOVAL, PARTIAL     upper lobe     SOCIAL HISTORY:  Social History   Social History  . Marital status: Widowed    Spouse name: N/A  . Number of children: N/A  . Years of education: N/A   Occupational History  . Not on  file.   Social History Main Topics  . Smoking status: Former Smoker    Packs/day: 0.75    Years: 35.00    Quit date: 1993  . Smokeless tobacco: Never Used     Comment: smoke-free X 30 yeras  . Alcohol use No  . Drug use: No  . Sexual activity: Not on file   Other Topics Concern  . Not on file   Social History Narrative  . No narrative on file    FAMILY HISTORY:  Family History  Problem Relation Age of Onset  . Cancer Mother   . Cancer Sister   . Colon cancer Neg  Hx     CURRENT MEDICATIONS:  Outpatient Encounter Prescriptions as of 06/09/2017  Medication Sig Note  . albuterol (PROVENTIL,VENTOLIN) 90 MCG/ACT inhaler Inhale 2 puffs into the lungs every 6 (six) hours as needed for wheezing.   . Alum Hydroxide-Mag Carbonate (GAVISCON PO) Take 1 capsule by mouth as needed (gas relief).    Marland Kitchen aspirin 81 MG tablet Take 81 mg by mouth daily.     . clotrimazole (LOTRIMIN) 1 % cream Apply 1 application topically 2 (two) times daily.   . ergocalciferol (VITAMIN D2) 50000 units capsule Take 1 capsule (50,000 Units total) by mouth once a week. 05/31/2017: Patient takes on Sundays  . gabapentin (NEURONTIN) 600 MG tablet Take 600 mg by mouth 3 (three) times daily.   Marland Kitchen levothyroxine (SYNTHROID, LEVOTHROID) 88 MCG tablet Take 100 mcg by mouth daily.    . metoprolol succinate (TOPROL-XL) 50 MG 24 hr tablet Take 50 mg by mouth daily. Take with or immediately following a meal.   . Multiple Vitamin (MULTIVITAMIN WITH MINERALS) TABS tablet Take 1 tablet by mouth daily.   . naproxen sodium (ANAPROX) 220 MG tablet Take 220 mg by mouth daily as needed.   Marland Kitchen telmisartan-hydrochlorothiazide (MICARDIS HCT) 80-12.5 MG per tablet Take 1 tablet by mouth daily.   Marland Kitchen VITAMIN D, ERGOCALCIFEROL, PO Take 1 capsule by mouth daily.   Marland Kitchen guaiFENesin (MUCINEX) 600 MG 12 hr tablet Take 1 tablet (600 mg total) by mouth 2 (two) times daily. (Patient not taking: Reported on 06/09/2017)   . [DISCONTINUED] albuterol (PROVENTIL) (2.5 MG/3ML) 0.083% nebulizer solution Take 3 mLs (2.5 mg total) by nebulization every 4 (four) hours as needed for wheezing or shortness of breath. (Patient not taking: Reported on 06/09/2017)   . [DISCONTINUED] predniSONE (DELTASONE) 10 MG tablet Take 40mg  po daily for 2 days then 30mg  daily for 2 days then 20mg  daily for 2 days then 10mg  daily for 2 days then stop (Patient not taking: Reported on 06/09/2017)    No facility-administered encounter medications on file as of 06/09/2017.      ALLERGIES:  No Known Allergies   PHYSICAL EXAM:  ECOG Performance status: 1 - Mild symptoms; remains independent   Vitals:   06/09/17 1306  BP: (!) 117/53  Pulse: 71  Resp: 18  Temp: 97.7 F (36.5 C)  SpO2: 92%   Filed Weights   06/09/17 1306  Weight: 208 lb 1.6 oz (94.4 kg)    Physical Exam  Constitutional: She is oriented to person, place, and time and well-developed, well-nourished, and in no distress.  HENT:  Head: Normocephalic.  Mouth/Throat: Oropharynx is clear and moist. No oropharyngeal exudate.  Eyes: Pupils are equal, round, and reactive to light. Conjunctivae are normal. No scleral icterus.  Neck: Normal range of motion. Neck supple.  Cardiovascular: Normal rate, regular rhythm and normal heart sounds.  Pulmonary/Chest: Effort normal. No respiratory distress. She has no wheezes. She has no rales.    Diminished breath sounds throughout, but clear.   Abdominal: Soft. Bowel sounds are normal. There is no tenderness.  Musculoskeletal: Normal range of motion. She exhibits no edema.  Lymphadenopathy:    She has no cervical adenopathy.       Right: No supraclavicular adenopathy present.       Left: No supraclavicular adenopathy present.  Neurological: She is alert and oriented to person, place, and time. No cranial nerve deficit. Gait normal.  Skin: Skin is warm and dry. No rash noted.  Psychiatric: Mood, memory, affect and judgment normal.  Nursing note and vitals reviewed.    LABORATORY DATA:  I have reviewed the labs as listed.  CBC    Component Value Date/Time   WBC 4.3 06/01/2017 0549   RBC 3.39 (L) 06/01/2017 0549   HGB 10.5 (L) 06/01/2017 0549   HCT 33.6 (L) 06/01/2017 0549   PLT 129 (L) 06/01/2017 0549   MCV 99.1 06/01/2017 0549   MCH 31.0 06/01/2017 0549   MCHC 31.3 06/01/2017 0549   RDW 14.1 06/01/2017 0549   LYMPHSABS 1.3 05/31/2017 1408   MONOABS 2.1 (H) 05/31/2017 1408   EOSABS 0.0 05/31/2017 1408   BASOSABS 0.0 05/31/2017 1408    CMP Latest Ref Rng & Units 06/01/2017 05/31/2017 06/09/2016  Glucose 65 - 99 mg/dL 162(H) 116(H) 96  BUN 6 - 20 mg/dL 28(H) 16 17  Creatinine 0.44 - 1.00 mg/dL 1.19(H) 0.99 1.08(H)  Sodium 135 - 145 mmol/L 137 139 136  Potassium 3.5 - 5.1 mmol/L 4.2 3.7 3.8  Chloride 101 - 111 mmol/L 97(L) 100(L) 102  CO2 22 - 32 mmol/L 29 29 29   Calcium 8.9 - 10.3 mg/dL 8.9 9.7 8.8(L)  Total Protein 6.5 - 8.1 g/dL - 8.2(H) 8.0  Total Bilirubin 0.3 - 1.2 mg/dL - 1.4(H) 0.7  Alkaline Phos 38 - 126 U/L - 58 51  AST 15 - 41 U/L - 24 20  ALT 14 - 54 U/L - 24 12(L)    PENDING LABS:    DIAGNOSTIC IMAGING:  *The following radiologic images and reports have been reviewed independently and agree with below findings.  Last CT chest: 06/23/16 CLINICAL DATA:  Annual surveillance for stage I non-small cell lung cancer.  EXAM: CT CHEST WITHOUT CONTRAST  TECHNIQUE: Multidetector CT imaging of the chest was performed following the standard protocol without IV contrast.  COMPARISON:  05/21/2014.  FINDINGS: Cardiovascular: Cardiopericardial silhouette is at upper limits of normal for size. Coronary artery calcification is noted. Atherosclerotic calcification is noted in the wall of the throracic aorta. No thoracic aortic aneurysm.  Mediastinum/Nodes: No mediastinal lymphadenopathy. No evidence for gross hilar lymphadenopathy although assessment is limited by the lack of intravenous contrast on today's study. Surgical clips noted left axilla There is no axillary lymphadenopathy. The esophagus has normal imaging features.  Lungs/Pleura: Changes of emphysema again noted, stable. Evidence of prior left lung wedge resection noted with stable scarring along the suture line. Atelectasis noted posterior right lower lobe. No focal airspace consolidation. Tiny nodules along the inferior aspect the right major fissure are stable.  Upper Abdomen: Unremarkable.  Musculoskeletal: Bone windows reveal no  worrisome lytic or sclerotic osseous lesions.  IMPRESSION: 1. Stable exam.  No new or progressive findings. 2. Previously described tiny perifissural nodules at the base of the right major fissure are stable and likely represent subpleural lymph nodes. 3. Coronary artery and thoracoabdominal aortic atherosclerosis.  4. Emphysema.   Electronically Signed   By: Misty Stanley M.D.   On: 06/23/2016 15:26   Last mammogram: 10/08/16 CLINICAL DATA:  Screening.  EXAM: 2D DIGITAL SCREENING BILATERAL MAMMOGRAM WITH CAD AND ADJUNCT TOMO  COMPARISON:  Previous exam(s).  ACR Breast Density Category b: There are scattered areas of fibroglandular density.  FINDINGS: There are no findings suspicious for malignancy. Images were processed with CAD.  IMPRESSION: No mammographic evidence of malignancy. A result letter of this screening mammogram will be mailed directly to the patient.  RECOMMENDATION: Screening mammogram in one year. (Code:[id number]-B-01Y)  BI-RADS CATEGORY  1: Negative.   Electronically Signed   By: Marin Olp M.D.   On: 10/08/2016 16:58    PATHOLOGY:     ASSESSMENT & PLAN:   Stage IA invasive ductal carcinoma of (L) breast, ER+/PR+:  -Diagnosed in 07/1998. Treated with lumpectomy, followed by adjuvant radiation therapy. She then took anti-estrogen therapy with Tamoxifen x 5 years.  -Last mammogram 10/08/16 negative for malignancy. She will be due for annual screening mammogram in 10/2017; orders placed today.   -Clinical breast exam performed today and benign.  -Return to cancer center in 1 year for follow-up.   Stage I adenocarcinoma of LUL lung:  -Diagnosed in 2006. Treated with LUL surgical resection by Dr. Arlyce Dice on 06/01/05.  -Due for annual CT chest without contrast sometime before the end of this month; orders placed today. We will contact her with results via phone when they are available.  -Return to cancer center in 1 year after annual CT  chest without contrast; orders placed for 2019 imaging today as well.    Leukopenia with neutropenia:  -Labs from recent hospitalization reviewed. WBCs normal at 4.3 on 06/01/17.  Will keep monitoring as clinically indicated.        Dispo:  -CT chest without contrast for continued surveillance of lung cancer due sometime before the end of this month; orders placed today.  -Annual screening mammogram due in 10/2017; orders placed today.  -Return to cancer center in 1 year after 2019 CT chest so that we may review results together during office visit.     All questions were answered to patient's stated satisfaction. Encouraged patient to call with any new concerns or questions before her next visit to the cancer center and we can certain see her sooner, if needed.    Plan of care discussed with Dr. Talbert Cage, who agrees with the above aforementioned.    Orders placed this encounter:  Orders Placed This Encounter  Procedures  . CT Chest Wo Contrast  . MM SCREENING BREAST TOMO BILATERAL  . CT Chest Wo Contrast      Mike Craze, NP [place] [phone]

## 2017-06-23 ENCOUNTER — Other Ambulatory Visit (HOSPITAL_COMMUNITY): Payer: Self-pay | Admitting: Respiratory Therapy

## 2017-06-23 DIAGNOSIS — R0602 Shortness of breath: Secondary | ICD-10-CM

## 2017-06-23 DIAGNOSIS — J9600 Acute respiratory failure, unspecified whether with hypoxia or hypercapnia: Secondary | ICD-10-CM

## 2017-06-25 ENCOUNTER — Ambulatory Visit (HOSPITAL_COMMUNITY)
Admission: RE | Admit: 2017-06-25 | Discharge: 2017-06-25 | Disposition: A | Discharge status: Home / Self Care | Payer: Medicare Other | Source: Ambulatory Visit | Attending: Internal Medicine | Admitting: Internal Medicine

## 2017-06-25 DIAGNOSIS — J9600 Acute respiratory failure, unspecified whether with hypoxia or hypercapnia: Secondary | ICD-10-CM | POA: Insufficient documentation

## 2017-06-25 DIAGNOSIS — Z87891 Personal history of nicotine dependence: Secondary | ICD-10-CM | POA: Diagnosis not present

## 2017-06-25 DIAGNOSIS — R942 Abnormal results of pulmonary function studies: Secondary | ICD-10-CM | POA: Diagnosis not present

## 2017-06-25 MED ORDER — ALBUTEROL SULFATE (2.5 MG/3ML) 0.083% IN NEBU
2.5000 mg | INHALATION_SOLUTION | Freq: Once | RESPIRATORY_TRACT | Status: AC
Start: 2017-06-25 — End: 2017-06-25
  Administered 2017-06-25: 2.5 mg via RESPIRATORY_TRACT

## 2017-06-27 LAB — PULMONARY FUNCTION TEST
DL/VA % pred: 57 %
DL/VA: 2.95 ml/min/mmHg/L
DLCO UNC % PRED: 25 %
DLCO UNC: 7.1 ml/min/mmHg
DLCO cor % pred: 25 %
DLCO cor: 7.1 ml/min/mmHg
FEF 25-75 PRE: 0.37 L/s
FEF 25-75 Post: 0.48 L/sec
FEF2575-%Change-Post: 29 %
FEF2575-%PRED-POST: 32 %
FEF2575-%Pred-Pre: 24 %
FEV1-%CHANGE-POST: 14 %
FEV1-%PRED-POST: 54 %
FEV1-%Pred-Pre: 47 %
FEV1-PRE: 0.86 L
FEV1-Post: 0.99 L
FEV1FVC-%Change-Post: 6 %
FEV1FVC-%Pred-Pre: 73 %
FEV6-%CHANGE-POST: 6 %
FEV6-%PRED-POST: 73 %
FEV6-%Pred-Pre: 68 %
FEV6-POST: 1.64 L
FEV6-PRE: 1.54 L
FEV6FVC-%CHANGE-POST: 0 %
FEV6FVC-%PRED-POST: 101 %
FEV6FVC-%PRED-PRE: 102 %
FVC-%CHANGE-POST: 7 %
FVC-%Pred-Post: 72 %
FVC-%Pred-Pre: 67 %
FVC-Post: 1.68 L
FVC-Pre: 1.56 L
POST FEV1/FVC RATIO: 59 %
PRE FEV6/FVC RATIO: 98 %
Post FEV6/FVC ratio: 98 %
Pre FEV1/FVC ratio: 55 %
RV % PRED: 221 %
RV: 5.68 L
TLC % PRED: 129 %
TLC: 7.1 L

## 2017-07-02 DIAGNOSIS — J441 Chronic obstructive pulmonary disease with (acute) exacerbation: Secondary | ICD-10-CM | POA: Diagnosis not present

## 2017-07-02 DIAGNOSIS — Z853 Personal history of malignant neoplasm of breast: Secondary | ICD-10-CM | POA: Diagnosis not present

## 2017-07-02 DIAGNOSIS — J9601 Acute respiratory failure with hypoxia: Secondary | ICD-10-CM | POA: Diagnosis not present

## 2017-07-02 DIAGNOSIS — Z6834 Body mass index (BMI) 34.0-34.9, adult: Secondary | ICD-10-CM | POA: Diagnosis not present

## 2017-08-24 DIAGNOSIS — Z23 Encounter for immunization: Secondary | ICD-10-CM | POA: Diagnosis not present

## 2017-10-05 DIAGNOSIS — E559 Vitamin D deficiency, unspecified: Secondary | ICD-10-CM | POA: Diagnosis not present

## 2017-10-05 DIAGNOSIS — I10 Essential (primary) hypertension: Secondary | ICD-10-CM | POA: Diagnosis not present

## 2017-10-05 DIAGNOSIS — D509 Iron deficiency anemia, unspecified: Secondary | ICD-10-CM | POA: Diagnosis not present

## 2017-10-05 DIAGNOSIS — E039 Hypothyroidism, unspecified: Secondary | ICD-10-CM | POA: Diagnosis not present

## 2017-10-07 DIAGNOSIS — Z6835 Body mass index (BMI) 35.0-35.9, adult: Secondary | ICD-10-CM | POA: Diagnosis not present

## 2017-10-07 DIAGNOSIS — D696 Thrombocytopenia, unspecified: Secondary | ICD-10-CM | POA: Diagnosis not present

## 2017-10-07 DIAGNOSIS — D508 Other iron deficiency anemias: Secondary | ICD-10-CM | POA: Diagnosis not present

## 2017-10-07 DIAGNOSIS — E039 Hypothyroidism, unspecified: Secondary | ICD-10-CM | POA: Diagnosis not present

## 2017-10-07 DIAGNOSIS — M1990 Unspecified osteoarthritis, unspecified site: Secondary | ICD-10-CM | POA: Diagnosis not present

## 2017-10-07 DIAGNOSIS — E559 Vitamin D deficiency, unspecified: Secondary | ICD-10-CM | POA: Diagnosis not present

## 2017-10-07 DIAGNOSIS — C3490 Malignant neoplasm of unspecified part of unspecified bronchus or lung: Secondary | ICD-10-CM | POA: Diagnosis not present

## 2017-10-07 DIAGNOSIS — I10 Essential (primary) hypertension: Secondary | ICD-10-CM | POA: Diagnosis not present

## 2017-10-07 DIAGNOSIS — J441 Chronic obstructive pulmonary disease with (acute) exacerbation: Secondary | ICD-10-CM | POA: Diagnosis not present

## 2017-10-07 DIAGNOSIS — Z712 Person consulting for explanation of examination or test findings: Secondary | ICD-10-CM | POA: Diagnosis not present

## 2017-10-07 DIAGNOSIS — G6282 Radiation-induced polyneuropathy: Secondary | ICD-10-CM | POA: Diagnosis not present

## 2017-10-07 DIAGNOSIS — D72819 Decreased white blood cell count, unspecified: Secondary | ICD-10-CM | POA: Diagnosis not present

## 2017-10-11 ENCOUNTER — Ambulatory Visit (HOSPITAL_COMMUNITY): Payer: Medicare Other

## 2017-10-20 ENCOUNTER — Encounter (HOSPITAL_COMMUNITY): Payer: Self-pay

## 2017-10-20 ENCOUNTER — Ambulatory Visit (HOSPITAL_COMMUNITY)
Admission: RE | Admit: 2017-10-20 | Discharge: 2017-10-20 | Disposition: A | Discharge status: Home / Self Care | Payer: Medicare Other | Source: Ambulatory Visit | Attending: Adult Health | Admitting: Adult Health

## 2017-10-20 DIAGNOSIS — Z1231 Encounter for screening mammogram for malignant neoplasm of breast: Secondary | ICD-10-CM | POA: Diagnosis not present

## 2018-02-02 ENCOUNTER — Encounter: Payer: Self-pay | Admitting: Internal Medicine

## 2018-02-08 DIAGNOSIS — C3490 Malignant neoplasm of unspecified part of unspecified bronchus or lung: Secondary | ICD-10-CM | POA: Diagnosis not present

## 2018-02-08 DIAGNOSIS — D696 Thrombocytopenia, unspecified: Secondary | ICD-10-CM | POA: Diagnosis not present

## 2018-02-08 DIAGNOSIS — Z6835 Body mass index (BMI) 35.0-35.9, adult: Secondary | ICD-10-CM | POA: Diagnosis not present

## 2018-02-08 DIAGNOSIS — E559 Vitamin D deficiency, unspecified: Secondary | ICD-10-CM | POA: Diagnosis not present

## 2018-02-08 DIAGNOSIS — Z853 Personal history of malignant neoplasm of breast: Secondary | ICD-10-CM | POA: Diagnosis not present

## 2018-02-08 DIAGNOSIS — J9601 Acute respiratory failure with hypoxia: Secondary | ICD-10-CM | POA: Diagnosis not present

## 2018-02-08 DIAGNOSIS — G9009 Other idiopathic peripheral autonomic neuropathy: Secondary | ICD-10-CM | POA: Diagnosis not present

## 2018-02-08 DIAGNOSIS — R0902 Hypoxemia: Secondary | ICD-10-CM | POA: Diagnosis not present

## 2018-02-08 DIAGNOSIS — D508 Other iron deficiency anemias: Secondary | ICD-10-CM | POA: Diagnosis not present

## 2018-02-08 DIAGNOSIS — D72819 Decreased white blood cell count, unspecified: Secondary | ICD-10-CM | POA: Diagnosis not present

## 2018-02-08 DIAGNOSIS — J441 Chronic obstructive pulmonary disease with (acute) exacerbation: Secondary | ICD-10-CM | POA: Diagnosis not present

## 2018-02-08 DIAGNOSIS — I714 Abdominal aortic aneurysm, without rupture: Secondary | ICD-10-CM | POA: Diagnosis not present

## 2018-02-11 DIAGNOSIS — D696 Thrombocytopenia, unspecified: Secondary | ICD-10-CM | POA: Diagnosis not present

## 2018-02-11 DIAGNOSIS — D72819 Decreased white blood cell count, unspecified: Secondary | ICD-10-CM | POA: Diagnosis not present

## 2018-02-11 DIAGNOSIS — Z853 Personal history of malignant neoplasm of breast: Secondary | ICD-10-CM | POA: Diagnosis not present

## 2018-02-11 DIAGNOSIS — I10 Essential (primary) hypertension: Secondary | ICD-10-CM | POA: Diagnosis not present

## 2018-02-11 DIAGNOSIS — G9009 Other idiopathic peripheral autonomic neuropathy: Secondary | ICD-10-CM | POA: Diagnosis not present

## 2018-02-11 DIAGNOSIS — C3490 Malignant neoplasm of unspecified part of unspecified bronchus or lung: Secondary | ICD-10-CM | POA: Diagnosis not present

## 2018-02-11 DIAGNOSIS — J441 Chronic obstructive pulmonary disease with (acute) exacerbation: Secondary | ICD-10-CM | POA: Diagnosis not present

## 2018-02-11 DIAGNOSIS — D509 Iron deficiency anemia, unspecified: Secondary | ICD-10-CM | POA: Diagnosis not present

## 2018-02-11 DIAGNOSIS — E559 Vitamin D deficiency, unspecified: Secondary | ICD-10-CM | POA: Diagnosis not present

## 2018-02-11 DIAGNOSIS — E039 Hypothyroidism, unspecified: Secondary | ICD-10-CM | POA: Diagnosis not present

## 2018-02-11 DIAGNOSIS — R944 Abnormal results of kidney function studies: Secondary | ICD-10-CM | POA: Diagnosis not present

## 2018-02-11 DIAGNOSIS — E782 Mixed hyperlipidemia: Secondary | ICD-10-CM | POA: Diagnosis not present

## 2018-03-02 ENCOUNTER — Ambulatory Visit (INDEPENDENT_AMBULATORY_CARE_PROVIDER_SITE_OTHER): Payer: Self-pay

## 2018-03-02 DIAGNOSIS — Z86010 Personal history of colonic polyps: Secondary | ICD-10-CM

## 2018-03-02 MED ORDER — NA SULFATE-K SULFATE-MG SULF 17.5-3.13-1.6 GM/177ML PO SOLN: 1.0000 | ORAL | 0 refills | Status: DC

## 2018-03-02 NOTE — Patient Instructions (Addendum)
Lisa [name]  [birth year] MRN: [id number]     Procedure Date: 04/26/18 Time to register: 12:00pm Place to register: [facility] Procedure Time: 1:00pm Scheduled provider: R. Garfield Cornea, MD    PREPARATION FOR COLONOSCOPY WITH SUPREP BOWEL PREP KIT  Note: Suprep Bowel Prep Kit is a split-dose (2day) regimen. Consumption of BOTH 6-ounce bottles is required for a complete prep.  Please notify us immediately if you are diabetic, take iron supplements, or if you are on Coumadin or any other blood thinners.                                                                                                                                                    2 DAYS BEFORE PROCEDURE:  DATE: 04/24/18   DAY: Sunday Begin clear liquid diet AFTER your lunch meal. NO SOLID FOODS after this point.  1 DAY BEFORE PROCEDURE:  DATE: 04/25/18   DAY: Monday Continue clear liquids the entire day - NO SOLID FOOD.     At 6:00pm: Complete steps 1 through 4 below, using ONE (1) 6-ounce bottle, before going to bed. Step 1:  Pour ONE (1) 6-ounce bottle of SUPREP liquid into the mixing container.  Step 2:  Add cool drinking water to the 16 ounce line on the container and mix.  Note: Dilute the solution concentrate as directed prior to use. Step 3:  DRINK ALL the liquid in the container. Step 4:  You MUST drink an additional two (2) or more 16 ounce containers of water  over the next one (1) hour.   Continue clear liquids only, until midnight. Do not eat or drink anything after midnight. EXCEPTION: If you take medications for your heart, blood pressure, or breathing, you may take these medications with a small amount of clear liquid.   DAY OF PROCEDURE:   DATE: 04/26/18   DAY: Tuesday    5 hours before your procedure at 8:00am: Step 1:  Pour ONE (1) 6-ounce bottle of SUPREP liquid into the mixing container.  Step 2:  Add cool drinking water to the 16 ounce line on the container and mix.  Note:  Dilute the solution concentrate as directed prior to use. Step 3:  DRINK ALL the liquid in the container. Step 4:  You MUST drink an additional two (2) or more 16 ounce containers of water over the next one (1) hour. You MUST complete the final glass of water at least 3 hours before your colonoscopy.   Nothing by mouth past 10:00am  You may take your morning medications with sip of water unless we have instructed otherwise.    Please see below for Dietary Information.  CLEAR LIQUIDS INCLUDE:  Water Jello (NOT red in color)   Ice Popsicles (NOT red in color)   Tea (sugar ok, no milk/cream) Powdered fruit flavored drinks  Coffee (sugar ok, no milk/cream) Gatorade/ Lemonade/ Kool-Aid  (NOT red in color)   Juice: apple, white grape, white cranberry Soft drinks  Clear bullion, consomme, broth (fat free beef/chicken/vegetable)  Carbonated beverages (any kind)  Strained chicken noodle soup Hard Candy   Remember: Clear liquids are liquids that will allow you to see your fingers on the other side of a clear glass. Be sure liquids are NOT red in color, and not cloudy, but CLEAR.  DO NOT EAT OR DRINK ANY OF THE FOLLOWING:  Dairy products of any kind   Cranberry juice Tomato juice / V8 juice   Grapefruit juice Orange juice     Red grape juice  Do not eat any solid foods, including such foods as: cereal, oatmeal, yogurt, fruits, vegetables, creamed soups, eggs, bread, crackers, pureed foods in a blender, etc.   HELPFUL HINTS FOR DRINKING PREP SOLUTION:   Make sure prep is extremely cold. Mix and refrigerate the the morning of the prep. You may also put in the freezer.   You may try mixing some Crystal Light or Country Time Lemonade if you prefer. Mix in small amounts; add more if necessary.  Try drinking through a straw  Rinse mouth with water or a mouthwash between glasses, to remove after-taste.  Try sipping on a cold beverage /ice/ popsicles between glasses of prep.  Place a piece of  sugar-free hard candy in mouth between glasses.  If you become nauseated, try consuming smaller amounts, or stretch out the time between glasses. Stop for 30-60 minutes, then slowly start back drinking.     OTHER INSTRUCTIONS  You will need a responsible adult at least 82 years of age to accompany you and drive you home. This person must remain in the waiting room during your procedure. The hospital will cancel your procedure if you do not have a responsible adult with you.   1. Wear loose fitting clothing that is easily removed. 2. Leave jewelry and other valuables at home.  3. Remove all body piercing jewelry and leave at home. 4. Total time from sign-in until discharge is approximately 2-3 hours. 5. You should go home directly after your procedure and rest. You can resume normal activities the day after your procedure. 6. The day of your procedure you should not:  Drive  Make legal decisions  Operate machinery  Drink alcohol  Return to work   You may call the office (Dept: [phone]) before 5:00pm, or page the doctor on call [phone]) after 5:00pm, for further instructions, if necessary.   Insurance Information YOU WILL NEED TO CHECK WITH YOUR INSURANCE COMPANY FOR THE BENEFITS OF COVERAGE YOU HAVE FOR THIS PROCEDURE.  UNFORTUNATELY, NOT ALL INSURANCE COMPANIES HAVE BENEFITS TO COVER ALL OR PART OF THESE TYPES OF PROCEDURES.  IT IS YOUR RESPONSIBILITY TO CHECK YOUR BENEFITS, HOWEVER, WE WILL BE GLAD TO ASSIST YOU WITH ANY CODES YOUR INSURANCE COMPANY MAY NEED.    PLEASE NOTE THAT MOST INSURANCE COMPANIES WILL NOT COVER A SCREENING COLONOSCOPY FOR PEOPLE UNDER THE AGE OF 50  IF YOU HAVE BCBS INSURANCE, YOU MAY HAVE BENEFITS FOR A SCREENING COLONOSCOPY BUT IF POLYPS ARE FOUND THE DIAGNOSIS WILL CHANGE AND THEN YOU MAY HAVE A DEDUCTIBLE THAT WILL NEED TO BE MET. SO PLEASE MAKE SURE YOU CHECK YOUR BENEFITS FOR A SCREENING COLONOSCOPY AS WELL AS A DIAGNOSTIC  COLONOSCOPY.

## 2018-03-02 NOTE — Progress Notes (Signed)
Gastroenterology Pre-Procedure Review  Request Date:03/02/18 Requesting Physician: 5 year recall RMR- (last tcs 03/06/18 hyperplastic polyp but had adenomas in the past.)  PATIENT REVIEW QUESTIONS: The patient responded to the following health history questions as indicated:    1. Diabetes Melitis: no 2. Joint replacements in the past 12 months: no 3. Major health problems in the past 3 months: no 4. Has an artificial valve or MVP: no 5. Has a defibrillator: no 6. Has been advised in past to take antibiotics in advance of a procedure like teeth cleaning: no 7. Family history of colon cancer: no  8. Alcohol Use: no 9. History of sleep apnea: no  10. History of coronary artery or other vascular stents placed within the last 12 months: no 11. History of any prior anesthesia complications: no    MEDICATIONS & ALLERGIES:    Patient reports the following regarding taking any blood thinners:   Plavix? no Aspirin? no Coumadin? no Brilinta? no Xarelto? no Eliquis? no Pradaxa? no Savaysa? no Effient? no  Patient confirms/reports the following medications:  Current Outpatient Medications  Medication Sig Dispense Refill  . albuterol (PROVENTIL,VENTOLIN) 90 MCG/ACT inhaler Inhale 2 puffs into the lungs every 6 (six) hours as needed for wheezing.    Marland Kitchen aspirin 81 MG tablet Take 81 mg by mouth daily.      Marland Kitchen levothyroxine (SYNTHROID, LEVOTHROID) 88 MCG tablet Take 100 mcg by mouth daily.     . metoprolol succinate (TOPROL-XL) 50 MG 24 hr tablet Take 50 mg by mouth daily. Take with or immediately following a meal.    . Multiple Vitamin (MULTIVITAMIN WITH MINERALS) TABS tablet Take 1 tablet by mouth daily.    Marland Kitchen telmisartan-hydrochlorothiazide (MICARDIS HCT) 80-12.5 MG per tablet Take 1 tablet by mouth daily.    Marland Kitchen VITAMIN D, ERGOCALCIFEROL, PO Take 1 capsule by mouth daily.     No current facility-administered medications for this visit.     Patient confirms/reports the following allergies:  No  Known Allergies  No orders of the defined types were placed in this encounter.   AUTHORIZATION INFORMATION Primary Insurance: medicare,  ID #: [id number] Pre-Cert / Josem Kaufmann required: no    SCHEDULE INFORMATION: Procedure has been scheduled as follows:  Date:04/26/18, Time: 12:00 Location: [facility] Dr.Rourk  This Gastroenterology Pre-Precedure Review Form is being routed to the following provider(s): Neil Crouch, PA

## 2018-03-07 NOTE — Progress Notes (Signed)
Per RMR result letter dated 03/07/13- pt needed repeat tcs in 5 years.

## 2018-03-07 NOTE — Progress Notes (Signed)
I would like to get Dr. Roseanne Kaufman opinion regarding benefits of pursing surveillance colonoscopy in this 84 y/o patient.   H/O colonic adenomas, last TCS 03/2013 and hyperplastic polyp.

## 2018-03-09 NOTE — Progress Notes (Signed)
Per RMR pt does not need to have this done if she is not having any problems. I called and explained it to the pt and she said she is not having any problems and she was fine with not having it done. I called [facility] and cancelled the procedure and have taken her off the schedule.

## 2018-04-19 DIAGNOSIS — L309 Dermatitis, unspecified: Secondary | ICD-10-CM | POA: Diagnosis not present

## 2018-04-19 DIAGNOSIS — G609 Hereditary and idiopathic neuropathy, unspecified: Secondary | ICD-10-CM | POA: Diagnosis not present

## 2018-04-26 ENCOUNTER — Encounter (HOSPITAL_COMMUNITY): Payer: Self-pay

## 2018-04-26 ENCOUNTER — Ambulatory Visit (HOSPITAL_COMMUNITY): Admit: 2018-04-26 | Payer: Medicare Other | Admitting: Internal Medicine

## 2018-04-26 SURGERY — COLONOSCOPY
Anesthesia: Moderate Sedation

## 2018-05-19 DIAGNOSIS — J441 Chronic obstructive pulmonary disease with (acute) exacerbation: Secondary | ICD-10-CM | POA: Diagnosis not present

## 2018-05-19 DIAGNOSIS — D508 Other iron deficiency anemias: Secondary | ICD-10-CM | POA: Diagnosis not present

## 2018-05-19 DIAGNOSIS — C3490 Malignant neoplasm of unspecified part of unspecified bronchus or lung: Secondary | ICD-10-CM | POA: Diagnosis not present

## 2018-05-19 DIAGNOSIS — Z6835 Body mass index (BMI) 35.0-35.9, adult: Secondary | ICD-10-CM | POA: Diagnosis not present

## 2018-05-19 DIAGNOSIS — G9009 Other idiopathic peripheral autonomic neuropathy: Secondary | ICD-10-CM | POA: Diagnosis not present

## 2018-05-19 DIAGNOSIS — D72819 Decreased white blood cell count, unspecified: Secondary | ICD-10-CM | POA: Diagnosis not present

## 2018-05-19 DIAGNOSIS — J9601 Acute respiratory failure with hypoxia: Secondary | ICD-10-CM | POA: Diagnosis not present

## 2018-05-19 DIAGNOSIS — I714 Abdominal aortic aneurysm, without rupture: Secondary | ICD-10-CM | POA: Diagnosis not present

## 2018-05-19 DIAGNOSIS — E559 Vitamin D deficiency, unspecified: Secondary | ICD-10-CM | POA: Diagnosis not present

## 2018-05-19 DIAGNOSIS — R0902 Hypoxemia: Secondary | ICD-10-CM | POA: Diagnosis not present

## 2018-05-19 DIAGNOSIS — D696 Thrombocytopenia, unspecified: Secondary | ICD-10-CM | POA: Diagnosis not present

## 2018-05-19 DIAGNOSIS — Z853 Personal history of malignant neoplasm of breast: Secondary | ICD-10-CM | POA: Diagnosis not present

## 2018-05-24 DIAGNOSIS — D500 Iron deficiency anemia secondary to blood loss (chronic): Secondary | ICD-10-CM | POA: Diagnosis not present

## 2018-05-24 DIAGNOSIS — Z853 Personal history of malignant neoplasm of breast: Secondary | ICD-10-CM | POA: Diagnosis not present

## 2018-05-24 DIAGNOSIS — E559 Vitamin D deficiency, unspecified: Secondary | ICD-10-CM | POA: Diagnosis not present

## 2018-05-24 DIAGNOSIS — G589 Mononeuropathy, unspecified: Secondary | ICD-10-CM | POA: Diagnosis not present

## 2018-05-24 DIAGNOSIS — E039 Hypothyroidism, unspecified: Secondary | ICD-10-CM | POA: Diagnosis not present

## 2018-05-24 DIAGNOSIS — D72819 Decreased white blood cell count, unspecified: Secondary | ICD-10-CM | POA: Diagnosis not present

## 2018-05-24 DIAGNOSIS — I10 Essential (primary) hypertension: Secondary | ICD-10-CM | POA: Diagnosis not present

## 2018-05-24 DIAGNOSIS — M150 Primary generalized (osteo)arthritis: Secondary | ICD-10-CM | POA: Diagnosis not present

## 2018-05-24 DIAGNOSIS — R944 Abnormal results of kidney function studies: Secondary | ICD-10-CM | POA: Diagnosis not present

## 2018-05-24 DIAGNOSIS — D696 Thrombocytopenia, unspecified: Secondary | ICD-10-CM | POA: Diagnosis not present

## 2018-05-24 DIAGNOSIS — E782 Mixed hyperlipidemia: Secondary | ICD-10-CM | POA: Diagnosis not present

## 2018-05-24 DIAGNOSIS — J449 Chronic obstructive pulmonary disease, unspecified: Secondary | ICD-10-CM | POA: Diagnosis not present

## 2018-05-31 DIAGNOSIS — L309 Dermatitis, unspecified: Secondary | ICD-10-CM | POA: Diagnosis not present

## 2018-06-06 ENCOUNTER — Other Ambulatory Visit: Payer: Self-pay

## 2018-06-06 ENCOUNTER — Ambulatory Visit (HOSPITAL_COMMUNITY): Payer: Medicare Other

## 2018-06-09 ENCOUNTER — Ambulatory Visit (HOSPITAL_COMMUNITY)
Admission: RE | Admit: 2018-06-09 | Discharge: 2018-06-09 | Disposition: A | Discharge status: Home / Self Care | Payer: Medicare Other | Source: Ambulatory Visit | Attending: Adult Health | Admitting: Adult Health

## 2018-06-09 ENCOUNTER — Ambulatory Visit (HOSPITAL_COMMUNITY): Payer: Medicare Other | Admitting: Internal Medicine

## 2018-06-09 DIAGNOSIS — J439 Emphysema, unspecified: Secondary | ICD-10-CM | POA: Insufficient documentation

## 2018-06-09 DIAGNOSIS — I700 Atherosclerosis of aorta: Secondary | ICD-10-CM | POA: Diagnosis not present

## 2018-06-09 DIAGNOSIS — C3492 Malignant neoplasm of unspecified part of left bronchus or lung: Secondary | ICD-10-CM | POA: Insufficient documentation

## 2018-06-10 ENCOUNTER — Ambulatory Visit (HOSPITAL_COMMUNITY): Payer: Medicare Other | Admitting: Internal Medicine

## 2018-06-10 ENCOUNTER — Inpatient Hospital Stay (HOSPITAL_COMMUNITY): Payer: Medicare Other | Attending: Internal Medicine | Admitting: Internal Medicine

## 2018-06-10 ENCOUNTER — Encounter (HOSPITAL_COMMUNITY): Payer: Self-pay | Admitting: Internal Medicine

## 2018-06-10 VITALS — BP 142/65 | HR 69 | Temp 97.5°F | Resp 16 | Wt 216.0 lb

## 2018-06-10 DIAGNOSIS — Z87891 Personal history of nicotine dependence: Secondary | ICD-10-CM | POA: Diagnosis not present

## 2018-06-10 DIAGNOSIS — Z85118 Personal history of other malignant neoplasm of bronchus and lung: Secondary | ICD-10-CM | POA: Diagnosis not present

## 2018-06-10 DIAGNOSIS — Z923 Personal history of irradiation: Secondary | ICD-10-CM | POA: Insufficient documentation

## 2018-06-10 DIAGNOSIS — Z7982 Long term (current) use of aspirin: Secondary | ICD-10-CM | POA: Diagnosis not present

## 2018-06-10 DIAGNOSIS — C50912 Malignant neoplasm of unspecified site of left female breast: Secondary | ICD-10-CM

## 2018-06-10 DIAGNOSIS — Z79899 Other long term (current) drug therapy: Secondary | ICD-10-CM | POA: Diagnosis not present

## 2018-06-10 DIAGNOSIS — Z853 Personal history of malignant neoplasm of breast: Secondary | ICD-10-CM | POA: Insufficient documentation

## 2018-06-10 NOTE — Patient Instructions (Signed)
[facility] Discharge Instructions  You saw Dr. Higgs today.   Thank you for choosing  Cancer Center at [facility] to provide your oncology and hematology care.  To afford each patient quality time with our provider, please arrive at least 15 minutes before your scheduled appointment time.   If you have a lab appointment with the Cancer Center please come in thru the  Main Entrance and check in at the main information desk  You need to re-schedule your appointment should you arrive 10 or more minutes late.  We strive to give you quality time with our providers, and arriving late affects you and other patients whose appointments are after yours.  Also, if you no show three or more times for appointments you may be dismissed from the clinic at the providers discretion.     Again, thank you for choosing [facility].  Our hope is that these requests will decrease the amount of time that you wait before being seen by our physicians.       _____________________________________________________________  Should you have questions after your visit to  Cancer Center, please contact our office at [phone] between the hours of 8:00 a.m. and 4:30 p.m.  Voicemails left after 4:00 p.m. will not be returned until the following business day.  For prescription refill requests, have your pharmacy contact our office and allow 72 hours.    Cancer Center Support Programs:   > Cancer Support Group  2nd Tuesday of the month 1pm-2pm, Journey Room    

## 2018-06-10 NOTE — Progress Notes (Signed)
Diagnosis Invasive ductal carcinoma of left breast ([place]) - Plan: CBC with Differential/Platelet, Comprehensive metabolic panel, Lactate dehydrogenase, CT Chest Wo Contrast  Staging Cancer Staging Invasive ductal carcinoma of left breast [facility]) Staging form: Breast, AJCC 7th Edition - Pathologic stage from 07/03/1998: Stage IA (T1a, N0, cM0) - Signed by Baird Cancer, PA-C on 06/09/2016   Assessment and Plan:  1.  Stage IA invasive ductal carcinoma of (L) breast, ER+/PR+.  Pt was diagnosed in 07/1998. Treated with lumpectomy, followed by adjuvant radiation therapy. She then took anti-estrogen therapy with Tamoxifen x 5 years.   Pt had bilateral screening mammogram done 10/20/2018 that was reviewed and was negative.  She will have screening mammogram done in 10/2019.   Pt will be seen yearly for follow-up.    2.  Stage I adenocarcinoma of LUL lung.  Pt was diagnosed in 2006. Treated with LUL surgical resection by Dr. Arlyce Dice on 06/01/05.   CT chest without contrast done 06/09/2018 reviewed and showed  IMPRESSION: 1. Stable exam.  No new or progressive interval findings. 2.  Aortic Atherosclerois (ICD10-170.0) 3.  Emphysema. (MVH84-O96.9)  Pt will be set up for CT chest without contrast in 06/2019 and will follow-up to go over results.    3.  HTN.  BP is 142/65.  Follow-up with PCP.    4.  Hypothyroidism.  Pt on synthroid.  Follow-up with PCP for monitoring.    5.  Leukopenia.  Pt has been followed for this in the past.  She reports she had recent blood work with PCP that was WNL.  Review of chart shows Labs done 05/2017 had WBC 4.3.  Follow-up with PCP for labs as directed.     Interval History:  Historical data obtained from the note dated 06/09/2017.  Stage IA invasive ductal carcinoma of (L) breast, ER+/PR+.  Pt was diagnosed in 07/1998. Treated with lumpectomy, followed by adjuvant radiation therapy. She then took anti-estrogen therapy with Tamoxifen x 5 years.   Pt also was followed for  Stage I adenocarcinoma of LUL lung.  Pt was diagnosed in 2006. Treated with LUL surgical resection by Dr. Arlyce Dice on 06/01/05.   Current Status:  Pt is seen today for follow-up.  She is here to go over CT scans.      Adenocarcinoma of left lung ([place])   06/23/2016 Imaging    CT chest- Stable exam. No new or progressive findings. Previously described tiny perifissural nodules at the base of the right major fissure are stable and likely represent subpleural lymph nodes.      Problem List Patient Active Problem List   Diagnosis Date Noted  . Acute respiratory failure with hypoxia ([place]) [J96.01] 05/31/2017  . Hyperglycemia [R73.9] 05/31/2017  . CKD (chronic kidney disease) stage 3, GFR 30-59 ml/min (HCC) [N18.3] 05/31/2017  . Normocytic anemia [D64.9] 05/31/2017  . COPD exacerbation ([place]) [J44.1] 05/31/2017  . AAA (abdominal aortic aneurysm) ([place]) [I71.4] 05/31/2017  . Urinary incontinence [R32] 05/31/2017  . Vitamin D deficiency [E55.9] 06/10/2016  . Neutropenia ([place]) [D70.9] 06/09/2016  . Invasive ductal carcinoma of left breast ([place]) [C50.912] 06/09/2016  . Adenocarcinoma of left lung ([place]) [C34.92] 06/09/2016  . Adenomatous polyps [D36.9] 02/08/2013    Past Medical History Past Medical History:  Diagnosis Date  . Adenocarcinoma of left lung ([place]) 2006  . Arthritis   . Asthma   . Cancer of breast, female ([place])   . Cancer of lung ([place])   . Hypertension   . Hypothyroidism   .  Invasive ductal carcinoma of left breast ([place]) 1999  . Neutropenia ([place]) 06/09/2016    Past Surgical History Past Surgical History:  Procedure Laterality Date  . BREAST BIOPSY     left axillary node dissection  . COLONOSCOPY  03/08/2003   KGU:RKYHCWCBJS rectal polyps destroyed with the tip of the snare/Polyps at hepatic flexure, splenic flexure at 35 cm/Left-sided diverticula: unable to retrieve path  . COLONOSCOPY  09/12/2008   EGB:TDVVOH rectum and distal sigmoid diminutive polyps/scattered left  sided diverticulum. hyperplastic  . COLONOSCOPY N/A 03/06/2013   YWV:PXTGGYI polyp-removed as described above; colonic diverticulosis. hyperplastic polyps. next TCS 03/2018  . FOOT SURGERY    . LUNG REMOVAL, PARTIAL     upper lobe    Family History Family History  Problem Relation Age of Onset  . Cancer Mother   . Cancer Sister   . Colon cancer Neg Hx      Social History  reports that she quit smoking about 26 years ago. She has a 26.25 pack-year smoking history. She has never used smokeless tobacco. She reports that she does not drink alcohol or use drugs.  Medications  Current Outpatient Medications:  .  albuterol (PROVENTIL,VENTOLIN) 90 MCG/ACT inhaler, Inhale 2 puffs into the lungs every 6 (six) hours as needed for wheezing., Disp: , Rfl:  .  aspirin 81 MG tablet, Take 81 mg by mouth daily.  , Disp: , Rfl:  .  levothyroxine (SYNTHROID, LEVOTHROID) 88 MCG tablet, Take 100 mcg by mouth daily. , Disp: , Rfl:  .  metoprolol succinate (TOPROL-XL) 50 MG 24 hr tablet, Take 50 mg by mouth daily. Take with or immediately following a meal., Disp: , Rfl:  .  Multiple Vitamin (MULTIVITAMIN WITH MINERALS) TABS tablet, Take 1 tablet by mouth daily., Disp: , Rfl:  .  Na Sulfate-K Sulfate-Mg Sulf (SUPREP BOWEL PREP KIT) 17.5-3.13-1.6 GM/177ML SOLN, Take 1 kit by mouth as directed., Disp: 1 Bottle, Rfl: 0 .  telmisartan-hydrochlorothiazide (MICARDIS HCT) 80-12.5 MG per tablet, Take 1 tablet by mouth daily., Disp: , Rfl:  .  VITAMIN D, ERGOCALCIFEROL, PO, Take 1 capsule by mouth daily., Disp: , Rfl:   Allergies Patient has no known allergies.  Review of Systems Review of Systems - Oncology ROS negative other than neuropathy   Physical Exam  Vitals Wt Readings from Last 3 Encounters:  06/10/18 216 lb (98 kg)  06/09/17 208 lb 1.6 oz (94.4 kg)  05/31/17 205 lb 11 oz (93.3 kg)   Temp Readings from Last 3 Encounters:  06/10/18 (!) 97.5 F (36.4 C) (Oral)  06/09/17 97.7 F (36.5 C) (Oral)   06/01/17 97.9 F (36.6 C) (Oral)   BP Readings from Last 3 Encounters:  06/10/18 (!) 142/65  06/09/17 (!) 117/53  06/01/17 (!) 108/50   Pulse Readings from Last 3 Encounters:  06/10/18 69  06/09/17 71  06/01/17 86   Constitutional: Well-developed, well-nourished, and in no distress.   HENT: Head: Normocephalic and atraumatic.  Mouth/Throat: No oropharyngeal exudate. Mucosa moist. Eyes: Pupils are equal, round, and reactive to light. Conjunctivae are normal. No scleral icterus.  Neck: Normal range of motion. Neck supple. No JVD present.  Cardiovascular: Normal rate, regular rhythm and normal heart sounds.  Exam reveals no gallop and no friction rub.   No murmur heard. Pulmonary/Chest: Effort normal and breath sounds normal. No respiratory distress. No wheezes.No rales.  Abdominal: Soft. Bowel sounds are normal. No distension. There is no tenderness. There is no guarding.  Musculoskeletal: No edema or tenderness.  Lymphadenopathy: No cervical, axillary or supraclavicular adenopathy.  Neurological: Alert and oriented to person, place, and time. No cranial nerve deficit.  Skin: Skin is warm and dry. No rash noted. No erythema. No pallor.  Psychiatric: Affect and judgment normal.  Bilateral breast exam:  Chaperone present.  Left lumpectomy present  Labs No visits with results within 3 Day(s) from this visit.  Latest known visit with results is:  Orders Only on 06/23/2017  Component Date Value Ref Range Status  . FVC-Pre 06/23/2017 1.56  L Final  . FVC-%Pred-Pre 06/23/2017 67  % Final  . FVC-Post 06/23/2017 1.68  L Final  . FVC-%Pred-Post 06/23/2017 72  % Final  . FVC-%Change-Post 06/23/2017 7  % Final  . FEV1-Pre 06/23/2017 0.86  L Final  . FEV1-%Pred-Pre 06/23/2017 47  % Final  . FEV1-Post 06/23/2017 0.99  L Final  . FEV1-%Pred-Post 06/23/2017 54  % Final  . FEV1-%Change-Post 06/23/2017 14  % Final  . FEV6-Pre 06/23/2017 1.54  L Final  . FEV6-%Pred-Pre 06/23/2017 68  %  Final  . FEV6-Post 06/23/2017 1.64  L Final  . FEV6-%Pred-Post 06/23/2017 73  % Final  . FEV6-%Change-Post 06/23/2017 6  % Final  . Pre FEV1/FVC ratio 06/23/2017 55  % Final  . FEV1FVC-%Pred-Pre 06/23/2017 73  % Final  . Post FEV1/FVC ratio 06/23/2017 59  % Final  . FEV1FVC-%Change-Post 06/23/2017 6  % Final  . Pre FEV6/FVC Ratio 06/23/2017 98  % Final  . FEV6FVC-%Pred-Pre 06/23/2017 102  % Final  . Post FEV6/FVC ratio 06/23/2017 98  % Final  . FEV6FVC-%Pred-Post 06/23/2017 101  % Final  . FEV6FVC-%Change-Post 06/23/2017 0  % Final  . FEF 25-75 Pre 06/23/2017 0.37  L/sec Final  . FEF2575-%Pred-Pre 06/23/2017 24  % Final  . FEF 25-75 Post 06/23/2017 0.48  L/sec Final  . FEF2575-%Pred-Post 06/23/2017 32  % Final  . FEF2575-%Change-Post 06/23/2017 29  % Final  . RV 06/23/2017 5.68  L Final  . RV % pred 06/23/2017 221  % Final  . TLC 06/23/2017 7.10  L Final  . TLC % pred 06/23/2017 129  % Final  . DLCO unc 06/23/2017 7.10  ml/min/mmHg Final  . DLCO unc % pred 06/23/2017 25  % Final  . DLCO cor 06/23/2017 7.10  ml/min/mmHg Final  . DLCO cor % pred 06/23/2017 25  % Final  . DL/VA 06/23/2017 2.95  ml/min/mmHg/L Final  . DL/VA % pred 06/23/2017 57  % Final     Pathology Orders Placed This Encounter  Procedures  . CT Chest Wo Contrast    Standing Status:   Future    Standing Expiration Date:   06/10/2020    Order Specific Question:   Preferred imaging location?    Answer:   [facility]    Order Specific Question:   Radiology Contrast Protocol - do NOT remove file path    Answer:   _[email or web address]  . CBC with Differential/Platelet    Standing Status:   Future    Standing Expiration Date:   06/10/2020  . Comprehensive metabolic panel    Standing Status:   Future    Standing Expiration Date:   06/10/2020  . Lactate dehydrogenase    Standing Status:   Future    Standing Expiration Date:   06/10/2020       Zoila Shutter MD

## 2018-08-10 DIAGNOSIS — Z23 Encounter for immunization: Secondary | ICD-10-CM | POA: Diagnosis not present

## 2018-09-09 ENCOUNTER — Other Ambulatory Visit (HOSPITAL_COMMUNITY): Payer: Self-pay | Admitting: Internal Medicine

## 2018-09-09 DIAGNOSIS — Z1231 Encounter for screening mammogram for malignant neoplasm of breast: Secondary | ICD-10-CM

## 2018-09-20 ENCOUNTER — Other Ambulatory Visit: Payer: Self-pay

## 2018-10-04 DIAGNOSIS — E559 Vitamin D deficiency, unspecified: Secondary | ICD-10-CM | POA: Diagnosis not present

## 2018-10-04 DIAGNOSIS — I714 Abdominal aortic aneurysm, without rupture: Secondary | ICD-10-CM | POA: Diagnosis not present

## 2018-10-04 DIAGNOSIS — J449 Chronic obstructive pulmonary disease, unspecified: Secondary | ICD-10-CM | POA: Diagnosis not present

## 2018-10-04 DIAGNOSIS — D696 Thrombocytopenia, unspecified: Secondary | ICD-10-CM | POA: Diagnosis not present

## 2018-10-04 DIAGNOSIS — D72819 Decreased white blood cell count, unspecified: Secondary | ICD-10-CM | POA: Diagnosis not present

## 2018-10-04 DIAGNOSIS — J9601 Acute respiratory failure with hypoxia: Secondary | ICD-10-CM | POA: Diagnosis not present

## 2018-10-04 DIAGNOSIS — Z6835 Body mass index (BMI) 35.0-35.9, adult: Secondary | ICD-10-CM | POA: Diagnosis not present

## 2018-10-04 DIAGNOSIS — R0902 Hypoxemia: Secondary | ICD-10-CM | POA: Diagnosis not present

## 2018-10-04 DIAGNOSIS — G9009 Other idiopathic peripheral autonomic neuropathy: Secondary | ICD-10-CM | POA: Diagnosis not present

## 2018-10-04 DIAGNOSIS — Z853 Personal history of malignant neoplasm of breast: Secondary | ICD-10-CM | POA: Diagnosis not present

## 2018-10-04 DIAGNOSIS — J441 Chronic obstructive pulmonary disease with (acute) exacerbation: Secondary | ICD-10-CM | POA: Diagnosis not present

## 2018-10-04 DIAGNOSIS — D508 Other iron deficiency anemias: Secondary | ICD-10-CM | POA: Diagnosis not present

## 2018-10-11 ENCOUNTER — Other Ambulatory Visit (HOSPITAL_COMMUNITY): Payer: Self-pay | Admitting: Internal Medicine

## 2018-10-11 DIAGNOSIS — I10 Essential (primary) hypertension: Secondary | ICD-10-CM | POA: Diagnosis not present

## 2018-10-11 DIAGNOSIS — N183 Chronic kidney disease, stage 3 (moderate): Secondary | ICD-10-CM | POA: Diagnosis not present

## 2018-10-11 DIAGNOSIS — D649 Anemia, unspecified: Secondary | ICD-10-CM | POA: Diagnosis not present

## 2018-10-11 DIAGNOSIS — E782 Mixed hyperlipidemia: Secondary | ICD-10-CM | POA: Diagnosis not present

## 2018-10-11 DIAGNOSIS — E559 Vitamin D deficiency, unspecified: Secondary | ICD-10-CM | POA: Diagnosis not present

## 2018-10-11 DIAGNOSIS — J449 Chronic obstructive pulmonary disease, unspecified: Secondary | ICD-10-CM | POA: Diagnosis not present

## 2018-10-11 DIAGNOSIS — D696 Thrombocytopenia, unspecified: Secondary | ICD-10-CM | POA: Diagnosis not present

## 2018-10-11 DIAGNOSIS — Z780 Asymptomatic menopausal state: Secondary | ICD-10-CM

## 2018-10-11 DIAGNOSIS — E039 Hypothyroidism, unspecified: Secondary | ICD-10-CM | POA: Diagnosis not present

## 2018-10-11 DIAGNOSIS — M19049 Primary osteoarthritis, unspecified hand: Secondary | ICD-10-CM | POA: Diagnosis not present

## 2018-10-11 DIAGNOSIS — I700 Atherosclerosis of aorta: Secondary | ICD-10-CM | POA: Diagnosis not present

## 2018-10-11 DIAGNOSIS — D72819 Decreased white blood cell count, unspecified: Secondary | ICD-10-CM | POA: Diagnosis not present

## 2018-10-11 DIAGNOSIS — Z0000 Encounter for general adult medical examination without abnormal findings: Secondary | ICD-10-CM | POA: Diagnosis not present

## 2018-10-24 ENCOUNTER — Encounter (HOSPITAL_COMMUNITY): Payer: Self-pay

## 2018-10-24 ENCOUNTER — Ambulatory Visit (HOSPITAL_COMMUNITY)
Admission: RE | Admit: 2018-10-24 | Discharge: 2018-10-24 | Disposition: A | Discharge status: Home / Self Care | Payer: Medicare Other | Source: Ambulatory Visit | Attending: Internal Medicine | Admitting: Internal Medicine

## 2018-10-24 DIAGNOSIS — Z780 Asymptomatic menopausal state: Secondary | ICD-10-CM

## 2018-10-24 DIAGNOSIS — M85851 Other specified disorders of bone density and structure, right thigh: Secondary | ICD-10-CM | POA: Diagnosis not present

## 2018-10-24 DIAGNOSIS — Z1231 Encounter for screening mammogram for malignant neoplasm of breast: Secondary | ICD-10-CM | POA: Insufficient documentation

## 2018-10-24 HISTORY — DX: Personal history of irradiation: Z92.3

## 2019-01-11 DIAGNOSIS — D72819 Decreased white blood cell count, unspecified: Secondary | ICD-10-CM | POA: Diagnosis not present

## 2019-01-11 DIAGNOSIS — E039 Hypothyroidism, unspecified: Secondary | ICD-10-CM | POA: Diagnosis not present

## 2019-01-11 DIAGNOSIS — E782 Mixed hyperlipidemia: Secondary | ICD-10-CM | POA: Diagnosis not present

## 2019-01-11 DIAGNOSIS — I10 Essential (primary) hypertension: Secondary | ICD-10-CM | POA: Diagnosis not present

## 2019-01-11 DIAGNOSIS — D500 Iron deficiency anemia secondary to blood loss (chronic): Secondary | ICD-10-CM | POA: Diagnosis not present

## 2019-01-11 DIAGNOSIS — E559 Vitamin D deficiency, unspecified: Secondary | ICD-10-CM | POA: Diagnosis not present

## 2019-01-11 DIAGNOSIS — D509 Iron deficiency anemia, unspecified: Secondary | ICD-10-CM | POA: Diagnosis not present

## 2019-01-16 DIAGNOSIS — Z9842 Cataract extraction status, left eye: Secondary | ICD-10-CM | POA: Diagnosis not present

## 2019-01-16 DIAGNOSIS — Z961 Presence of intraocular lens: Secondary | ICD-10-CM | POA: Diagnosis not present

## 2019-01-16 DIAGNOSIS — H04123 Dry eye syndrome of bilateral lacrimal glands: Secondary | ICD-10-CM | POA: Diagnosis not present

## 2019-01-16 DIAGNOSIS — Z9841 Cataract extraction status, right eye: Secondary | ICD-10-CM | POA: Diagnosis not present

## 2019-01-24 DIAGNOSIS — L309 Dermatitis, unspecified: Secondary | ICD-10-CM | POA: Diagnosis not present

## 2019-01-24 DIAGNOSIS — M79672 Pain in left foot: Secondary | ICD-10-CM | POA: Diagnosis not present

## 2019-01-24 DIAGNOSIS — M25572 Pain in left ankle and joints of left foot: Secondary | ICD-10-CM | POA: Diagnosis not present

## 2019-01-24 DIAGNOSIS — M76822 Posterior tibial tendinitis, left leg: Secondary | ICD-10-CM | POA: Diagnosis not present

## 2019-01-24 DIAGNOSIS — R600 Localized edema: Secondary | ICD-10-CM | POA: Diagnosis not present

## 2019-01-26 DIAGNOSIS — I10 Essential (primary) hypertension: Secondary | ICD-10-CM | POA: Diagnosis not present

## 2019-01-26 DIAGNOSIS — M19049 Primary osteoarthritis, unspecified hand: Secondary | ICD-10-CM | POA: Diagnosis not present

## 2019-01-26 DIAGNOSIS — M19079 Primary osteoarthritis, unspecified ankle and foot: Secondary | ICD-10-CM | POA: Diagnosis not present

## 2019-01-26 DIAGNOSIS — J449 Chronic obstructive pulmonary disease, unspecified: Secondary | ICD-10-CM | POA: Diagnosis not present

## 2019-01-26 DIAGNOSIS — E782 Mixed hyperlipidemia: Secondary | ICD-10-CM | POA: Diagnosis not present

## 2019-01-26 DIAGNOSIS — G9009 Other idiopathic peripheral autonomic neuropathy: Secondary | ICD-10-CM | POA: Diagnosis not present

## 2019-01-26 DIAGNOSIS — I700 Atherosclerosis of aorta: Secondary | ICD-10-CM | POA: Diagnosis not present

## 2019-01-26 DIAGNOSIS — D72819 Decreased white blood cell count, unspecified: Secondary | ICD-10-CM | POA: Diagnosis not present

## 2019-01-26 DIAGNOSIS — D696 Thrombocytopenia, unspecified: Secondary | ICD-10-CM | POA: Diagnosis not present

## 2019-01-26 DIAGNOSIS — E039 Hypothyroidism, unspecified: Secondary | ICD-10-CM | POA: Diagnosis not present

## 2019-01-26 DIAGNOSIS — N183 Chronic kidney disease, stage 3 (moderate): Secondary | ICD-10-CM | POA: Diagnosis not present

## 2019-01-26 DIAGNOSIS — E559 Vitamin D deficiency, unspecified: Secondary | ICD-10-CM | POA: Diagnosis not present

## 2019-02-14 DIAGNOSIS — L309 Dermatitis, unspecified: Secondary | ICD-10-CM | POA: Diagnosis not present

## 2019-02-14 DIAGNOSIS — M79672 Pain in left foot: Secondary | ICD-10-CM | POA: Diagnosis not present

## 2019-03-01 DIAGNOSIS — D696 Thrombocytopenia, unspecified: Secondary | ICD-10-CM | POA: Diagnosis not present

## 2019-03-01 DIAGNOSIS — N183 Chronic kidney disease, stage 3 (moderate): Secondary | ICD-10-CM | POA: Diagnosis not present

## 2019-03-01 DIAGNOSIS — G9009 Other idiopathic peripheral autonomic neuropathy: Secondary | ICD-10-CM | POA: Diagnosis not present

## 2019-03-01 DIAGNOSIS — D508 Other iron deficiency anemias: Secondary | ICD-10-CM | POA: Diagnosis not present

## 2019-03-01 DIAGNOSIS — J441 Chronic obstructive pulmonary disease with (acute) exacerbation: Secondary | ICD-10-CM | POA: Diagnosis not present

## 2019-03-01 DIAGNOSIS — Z85118 Personal history of other malignant neoplasm of bronchus and lung: Secondary | ICD-10-CM | POA: Diagnosis not present

## 2019-03-01 DIAGNOSIS — I714 Abdominal aortic aneurysm, without rupture: Secondary | ICD-10-CM | POA: Diagnosis not present

## 2019-03-01 DIAGNOSIS — D649 Anemia, unspecified: Secondary | ICD-10-CM | POA: Diagnosis not present

## 2019-03-01 DIAGNOSIS — I700 Atherosclerosis of aorta: Secondary | ICD-10-CM | POA: Diagnosis not present

## 2019-03-01 DIAGNOSIS — J9601 Acute respiratory failure with hypoxia: Secondary | ICD-10-CM | POA: Diagnosis not present

## 2019-03-01 DIAGNOSIS — R634 Abnormal weight loss: Secondary | ICD-10-CM | POA: Diagnosis not present

## 2019-03-01 DIAGNOSIS — Z853 Personal history of malignant neoplasm of breast: Secondary | ICD-10-CM | POA: Diagnosis not present

## 2019-03-03 DIAGNOSIS — D72819 Decreased white blood cell count, unspecified: Secondary | ICD-10-CM | POA: Diagnosis not present

## 2019-03-03 DIAGNOSIS — Z85118 Personal history of other malignant neoplasm of bronchus and lung: Secondary | ICD-10-CM | POA: Diagnosis not present

## 2019-03-03 DIAGNOSIS — D649 Anemia, unspecified: Secondary | ICD-10-CM | POA: Diagnosis not present

## 2019-03-03 DIAGNOSIS — D696 Thrombocytopenia, unspecified: Secondary | ICD-10-CM | POA: Diagnosis not present

## 2019-03-03 DIAGNOSIS — Z853 Personal history of malignant neoplasm of breast: Secondary | ICD-10-CM | POA: Diagnosis not present

## 2019-03-13 ENCOUNTER — Other Ambulatory Visit: Payer: Self-pay

## 2019-03-13 ENCOUNTER — Inpatient Hospital Stay (HOSPITAL_COMMUNITY): Payer: Medicare Other

## 2019-03-13 ENCOUNTER — Encounter (HOSPITAL_COMMUNITY): Payer: Self-pay | Admitting: Hematology

## 2019-03-13 ENCOUNTER — Inpatient Hospital Stay (HOSPITAL_COMMUNITY): Payer: Medicare Other | Attending: Hematology | Admitting: Hematology

## 2019-03-13 VITALS — BP 155/69 | HR 73 | Temp 97.9°F | Resp 18 | Wt 211.1 lb

## 2019-03-13 DIAGNOSIS — Z923 Personal history of irradiation: Secondary | ICD-10-CM | POA: Diagnosis not present

## 2019-03-13 DIAGNOSIS — D61818 Other pancytopenia: Secondary | ICD-10-CM | POA: Diagnosis not present

## 2019-03-13 DIAGNOSIS — Z853 Personal history of malignant neoplasm of breast: Secondary | ICD-10-CM | POA: Diagnosis not present

## 2019-03-13 DIAGNOSIS — Z87891 Personal history of nicotine dependence: Secondary | ICD-10-CM | POA: Diagnosis not present

## 2019-03-13 DIAGNOSIS — Z85118 Personal history of other malignant neoplasm of bronchus and lung: Secondary | ICD-10-CM | POA: Diagnosis not present

## 2019-03-13 DIAGNOSIS — D539 Nutritional anemia, unspecified: Secondary | ICD-10-CM | POA: Insufficient documentation

## 2019-03-13 LAB — CBC WITH DIFFERENTIAL/PLATELET
Abs Immature Granulocytes: 0.03 10*3/uL (ref 0.00–0.07)
Basophils Absolute: 0 10*3/uL (ref 0.0–0.1)
Basophils Relative: 0 %
Eosinophils Absolute: 0 10*3/uL (ref 0.0–0.5)
Eosinophils Relative: 0 %
HCT: 34.5 % — ABNORMAL LOW (ref 36.0–46.0)
Hemoglobin: 10.2 g/dL — ABNORMAL LOW (ref 12.0–15.0)
Immature Granulocytes: 1 %
Lymphocytes Relative: 38 %
Lymphs Abs: 1.5 10*3/uL (ref 0.7–4.0)
MCH: 28.8 pg (ref 26.0–34.0)
MCHC: 29.6 g/dL — ABNORMAL LOW (ref 30.0–36.0)
MCV: 97.5 fL (ref 80.0–100.0)
Monocytes Absolute: 1.1 10*3/uL — ABNORMAL HIGH (ref 0.1–1.0)
Monocytes Relative: 29 %
Neutro Abs: 1.2 10*3/uL — ABNORMAL LOW (ref 1.7–7.7)
Neutrophils Relative %: 32 %
Platelets: 164 10*3/uL (ref 150–400)
RBC: 3.54 MIL/uL — ABNORMAL LOW (ref 3.87–5.11)
RDW: 17 % — ABNORMAL HIGH (ref 11.5–15.5)
WBC: 3.9 10*3/uL — ABNORMAL LOW (ref 4.0–10.5)
nRBC: 0 % (ref 0.0–0.2)

## 2019-03-13 LAB — COMPREHENSIVE METABOLIC PANEL
ALT: 10 U/L (ref 0–44)
AST: 14 U/L — ABNORMAL LOW (ref 15–41)
Albumin: 3.6 g/dL (ref 3.5–5.0)
Alkaline Phosphatase: 58 U/L (ref 38–126)
Anion gap: 10 (ref 5–15)
BUN: 22 mg/dL (ref 8–23)
CO2: 26 mmol/L (ref 22–32)
Calcium: 9.1 mg/dL (ref 8.9–10.3)
Chloride: 100 mmol/L (ref 98–111)
Creatinine, Ser: 1.08 mg/dL — ABNORMAL HIGH (ref 0.44–1.00)
GFR calc Af Amer: 55 mL/min — ABNORMAL LOW (ref >60)
GFR calc non Af Amer: 48 mL/min — ABNORMAL LOW (ref >60)
Glucose, Bld: 89 mg/dL (ref 70–99)
Potassium: 3.9 mmol/L (ref 3.5–5.1)
Sodium: 136 mmol/L (ref 135–145)
Total Bilirubin: 0.7 mg/dL (ref 0.3–1.2)
Total Protein: 8.5 g/dL — ABNORMAL HIGH (ref 6.5–8.1)

## 2019-03-13 LAB — RETICULOCYTES
Immature Retic Fract: 15.5 % (ref 2.3–15.9)
RBC.: 3.54 MIL/uL — ABNORMAL LOW (ref 3.87–5.11)
Retic Count, Absolute: 125.7 10*3/uL (ref 19.0–186.0)
Retic Ct Pct: 3.6 % — ABNORMAL HIGH (ref 0.4–3.1)

## 2019-03-13 LAB — LACTATE DEHYDROGENASE: LDH: 206 U/L — ABNORMAL HIGH (ref 98–192)

## 2019-03-13 LAB — FOLATE: Folate: 11.9 ng/mL (ref >5.9)

## 2019-03-13 NOTE — Assessment & Plan Note (Signed)
1.  Pancytopenia: - Patient referred to [place] from Dr. Lavone Neri for further work-up of pancytopenia. - CBC on 03/01/2019 shows white count of 3.3, hemoglobin of 9.7, MCV of 94, platelet count 147 with 22% neutrophils with ANC of 700.  B12 level was normal. -Prior to that CBC on 01/11/2019 shows white count of 3.7, hemoglobin 9.9 and platelet count of 159 with ANC of 1100. -CBC on 10/04/2018 showed white count of 4.6, hemoglobin 11.1 and platelet count of 154 with ANC of 1200. -She had a history of breast cancer in 1999, treated with lumpectomy and radiation therapy.  Never received chemotherapy. - She does have occasional numbness in the fingertips and toes at nighttime.  Denies any fevers, night sweats or weight loss.  She reports working in [employer] fund[employer].  She smoked 1 pack/day of cigarettes for 20 years and quit at age 47.  Sister died of leukemia. - Differential diagnosis for pancytopenia includes bone marrow infiltrative process versus myelodysplastic syndrome. - We will repeat her CBC with differential today, and review her smear.  We will check LDH, folic acid, copper, methylmalonic acid.  We will also send for SPEP, ANA, rheumatoid factor and hepatitis panel. -We will see her back in 1 week for follow-up.  2.  Stage Ia left breast cancer: - Diagnosed in September 1999, treated with lumpectomy and XRT.  ER/PR was positive.  She received tamoxifen for 5 years. -Mammogram on 10/24/2018 was BI-RADS Category 1.  3.  Stage I adenocarcinoma of the left upper lobe of the lung: - She underwent left upper lobectomy on 06/01/2005. -Last CT chest without contrast on 06/09/2018 shows stable exam with no new or progressive findings.

## 2019-03-13 NOTE — Patient Instructions (Signed)
[place] at [facility] Discharge Instructions  You were seen today by Dr. Delton Coombes. He went over your history, family history and how you've been doing lately. He would like you to have blood work done today.  He will see you back in 2 weeks for follow up.   Thank you for choosing [place] at [facility] to provide your oncology and hematology care.  To afford each patient quality time with our provider, please arrive at least 15 minutes before your scheduled appointment time.   If you have a lab appointment with the [facility] please come in thru the  Main Entrance and check in at the main information desk  You need to re-schedule your appointment should you arrive 10 or more minutes late.  We strive to give you quality time with our providers, and arriving late affects you and other patients whose appointments are after yours.  Also, if you no show three or more times for appointments you may be dismissed from the clinic at the providers discretion.     Again, thank you for choosing [facility].  Our hope is that these requests will decrease the amount of time that you wait before being seen by our physicians.       _____________________________________________________________  Should you have questions after your visit to [facility], please contact our office at [phone] between the hours of 8:00 a.m. and 4:30 p.m.  Voicemails left after 4:00 p.m. will not be returned until the following business day.  For prescription refill requests, have your pharmacy contact our office and allow 72 hours.    Cancer Center Support Programs:   > Cancer Support Group  2nd Tuesday of the month 1pm-2pm, Journey Room

## 2019-03-13 NOTE — Progress Notes (Signed)
CONSULT NOTE  Patient Care Team: Celene Squibb, MD as PCP - General (Internal Medicine) [name], MD as Consulting Physician (Gastroenterology)  CHIEF COMPLAINTS/PURPOSE OF CONSULTATION:  Consult for Pancytopenia   HISTORY OF PRESENTING ILLNESS:  Lisa [name] 85 y.o. female presents for consult regarding newly diagnosed pancytopenia. In routine follow up with her PCP in April, revealed WBC of  3.3, ANC: 0.7, Hgb: 9.7, MCV: 94, Platelets: 147k. In a previous follow up with her PCP in December 2019, CBC revealed a WBC 4.6, ANC: 1.2, Hgb: 11.1, MCV: 96, PLT: 154k. She denies any recurrent infections. Denies any fevers, chills, or night sweats. No lymphadenopathy. No significant weight loss. No change in appetite. Reports overall doing well. No significant fatigue.   She has a history of Stage 1A  invasive ductal carcinoma of (L) breast, ER+/PR+.  Pt was diagnosed in 07/1998. Treated with lumpectomy, followed by adjuvant radiation therapy. She then took anti-estrogen therapy with Tamoxifen x 5 years. Her last mammogram was in Dec 2019 and was Bi-RADS 1. She also has a history of Stage I adenocarcinoma of LUL lung.  Pt was diagnosed in 2006. Treated with LUL surgical resection. Most recent Ct chest from August 2019 was negative for disease. She has a repeat  CT Chest scheduled for August 2020.   She is a former smoker. She smoked 1ppd for 20 years. She quit 40 years ago. Her sister had a blood cancer, possibly a form of leukemia.    MEDICAL HISTORY:  Past Medical History:  Diagnosis Date  . Adenocarcinoma of left lung ([place]) 2006  . Arthritis   . Asthma   . Cancer of breast, female ([place])   . Cancer of lung ([place])   . Hypertension   . Hypothyroidism   . Invasive ductal carcinoma of left breast ([place]) 1999  . Neutropenia ([place]) 06/09/2016  . Personal history of radiation therapy     SURGICAL HISTORY: Past Surgical History:  Procedure Laterality Date  . BREAST BIOPSY     left  axillary node dissection  . BREAST LUMPECTOMY Left   . COLONOSCOPY  03/08/2003   BSW:HQPRFFMBWG rectal polyps destroyed with the tip of the snare/Polyps at hepatic flexure, splenic flexure at 35 cm/Left-sided diverticula: unable to retrieve path  . COLONOSCOPY  09/12/2008   YKZ:LDJTTS rectum and distal sigmoid diminutive polyps/scattered left sided diverticulum. hyperplastic  . COLONOSCOPY N/A 03/06/2013   VXB:LTJQZES polyp-removed as described above; colonic diverticulosis. hyperplastic polyps. next TCS 03/2018  . FOOT SURGERY    . LUNG REMOVAL, PARTIAL     upper lobe    SOCIAL HISTORY: Social History   Socioeconomic History  . Marital status: Widowed    Spouse name: Not on file  . Number of children: Not on file  . Years of education: Not on file  . Highest education level: Not on file  Occupational History  . Not on file  Social Needs  . Financial resource strain: Not on file  . Food insecurity:    Worry: Not on file    Inability: Not on file  . Transportation needs:    Medical: Not on file    Non-medical: Not on file  Tobacco Use  . Smoking status: Former Smoker    Packs/day: 0.75    Years: 35.00    Pack years: 26.25    Last attempt to quit: 1993    Years since quitting: 27.3  . Smokeless tobacco: Never Used  . Tobacco comment: smoke-free X 30 yeras  Substance and Sexual Activity  . Alcohol use: No  . Drug use: No  . Sexual activity: Not on file  Lifestyle  . Physical activity:    Days per week: Not on file    Minutes per session: Not on file  . Stress: Not on file  Relationships  . Social connections:    Talks on phone: Not on file    Gets together: Not on file    Attends religious service: Not on file    Active member of club or organization: Not on file    Attends meetings of clubs or organizations: Not on file    Relationship status: Not on file  . Intimate partner violence:    Fear of current or ex partner: Not on file    Emotionally abused: Not on  file    Physically abused: Not on file    Forced sexual activity: Not on file  Other Topics Concern  . Not on file  Social History Narrative  . Not on file    FAMILY HISTORY: Family History  Problem Relation Age of Onset  . Cancer Mother   . Cancer Sister   . Colon cancer Neg Hx     ALLERGIES:  has No Known Allergies.  MEDICATIONS:  Current Outpatient Medications  Medication Sig Dispense Refill  . clobetasol ointment (TEMOVATE) 0.05 %     . ferrous sulfate 325 (65 FE) MG tablet Take 325 mg by mouth daily with breakfast.    . gabapentin (NEURONTIN) 600 MG tablet     . levothyroxine (SYNTHROID, LEVOTHROID) 88 MCG tablet Take 88 mcg by mouth daily.     . meloxicam (MOBIC) 7.5 MG tablet     . metoprolol succinate (TOPROL-XL) 50 MG 24 hr tablet Take 50 mg by mouth daily. Take with or immediately following a meal.    . Multiple Vitamin (MULTIVITAMIN WITH MINERALS) TABS tablet Take 1 tablet by mouth daily.    Marland Kitchen telmisartan-hydrochlorothiazide (MICARDIS HCT) 80-12.5 MG per tablet Take 1 tablet by mouth daily.    . TRELEGY ELLIPTA 100-62.5-25 MCG/INH AEPB     . VITAMIN D, ERGOCALCIFEROL, PO Take 1 capsule by mouth daily.    Marland Kitchen albuterol (PROVENTIL,VENTOLIN) 90 MCG/ACT inhaler Inhale 2 puffs into the lungs every 6 (six) hours as needed for wheezing.     No current facility-administered medications for this visit.     REVIEW OF SYSTEMS:   Constitutional: Denies fevers, chills or abnormal night sweats Eyes: Denies blurriness of vision, double vision or watery eyes Ears, nose, mouth, throat, and face: Denies mucositis or sore throat Respiratory: Denies cough, dyspnea or wheezes Cardiovascular: Denies palpitation, chest discomfort or lower extremity swelling Gastrointestinal:  Denies nausea, heartburn or change in bowel habits Skin: Denies abnormal skin rashes Lymphatics: Denies new lymphadenopathy or easy bruising Neurological:Denies numbness, tingling or new  weaknesses Behavioral/Psych: Mood is stable, no new changes  All other systems were reviewed with the patient and are negative.  PHYSICAL EXAMINATION: ECOG PERFORMANCE STATUS: 1 - Symptomatic but completely ambulatory  Vitals:   03/13/19 1352  BP: (!) 155/69  Pulse: 73  Resp: 18  Temp: 97.9 F (36.6 C)  SpO2: 94%   Filed Weights   03/13/19 1352  Weight: 211 lb 1.6 oz (95.8 kg)    GENERAL:alert, no distress and comfortable SKIN: skin color, texture, turgor are normal, no rashes or significant lesions EYES: normal, conjunctiva are pink and non-injected, sclera clear OROPHARYNX:no exudate, no erythema and lips, buccal mucosa, and tongue  normal  NECK: supple, thyroid normal size, non-tender, without nodularity LYMPH:  no palpable lymphadenopathy in the cervical, axillary or inguinal LUNGS: clear to auscultation and percussion with normal breathing effort HEART: regular rate & rhythm and no murmurs and no lower extremity edema ABDOMEN:abdomen soft, non-tender and normal bowel sounds Musculoskeletal:no cyanosis of digits and no clubbing  PSYCH: alert & oriented x 3 with fluent speech NEURO: no focal motor/sensory deficits  LABORATORY DATA:  I have reviewed the data as listed  RADIOGRAPHIC STUDIES: I have personally reviewed the radiological images as listed and agreed with the findings in the report.  ASSESSMENT & PLAN:  Reviewed referral records and labs. Patient has decreases in all peripheral blood lineages. We will need to rule out marrow infiltrate vs marrow failure. There is concern for underlying Myelodysplastic Syndrome. She does not present with any B symptoms. We will complete lab studies today to assess etiology of pancytopenia. If blood work is inconclusive, bone marrow biopsy will be considered.   Other pancytopenia ([place]) 1.  Pancytopenia: - Patient referred to [place] from Dr. Lavone Neri for further work-up of pancytopenia. - CBC on 03/01/2019 shows white count of 3.3,  hemoglobin of 9.7, MCV of 94, platelet count 147 with 22% neutrophils with ANC of 700.  B12 level was normal. -Prior to that CBC on 01/11/2019 shows white count of 3.7, hemoglobin 9.9 and platelet count of 159 with ANC of 1100. -CBC on 10/04/2018 showed white count of 4.6, hemoglobin 11.1 and platelet count of 154 with ANC of 1200. -She had a history of breast cancer in 1999, treated with lumpectomy and radiation therapy.  Never received chemotherapy. - She does have occasional numbness in the fingertips and toes at nighttime.  Denies any fevers, night sweats or weight loss.  She reports working in [employer] fund[employer].  She smoked 1 pack/day of cigarettes for 20 years and quit at age 17.  Sister died of leukemia. - Differential diagnosis for pancytopenia includes bone marrow infiltrative process versus myelodysplastic syndrome. - We will repeat her CBC with differential today, and review her smear.  We will check LDH, folic acid, copper, methylmalonic acid.  We will also send for SPEP, ANA, rheumatoid factor and hepatitis panel. -We will see her back in 1 week for follow-up.  2.  Stage Ia left breast cancer: - Diagnosed in September 1999, treated with lumpectomy and XRT.  ER/PR was positive.  She received tamoxifen for 5 years. -Mammogram on 10/24/2018 was BI-RADS Category 1.  3.  Stage I adenocarcinoma of the left upper lobe of the lung: - She underwent left upper lobectomy on 06/01/2005. -Last CT chest without contrast on 06/09/2018 shows stable exam with no new or progressive findings.   All questions were answered. The patient knows to call the clinic with any problems, questions or concerns.      Derek Jack, MD 03/13/19 4:27 PM

## 2019-03-14 DIAGNOSIS — L309 Dermatitis, unspecified: Secondary | ICD-10-CM | POA: Diagnosis not present

## 2019-03-14 LAB — HEPATITIS B SURFACE ANTIBODY,QUALITATIVE: Hep B S Ab: NONREACTIVE

## 2019-03-14 LAB — HEPATITIS C ANTIBODY: HCV Ab: 0.3 s/co ratio (ref 0.0–0.9)

## 2019-03-14 LAB — FANA STAINING PATTERNS: Homogeneous Pattern: 1:80 {titer}

## 2019-03-14 LAB — HEPATITIS B SURFACE ANTIGEN: Hepatitis B Surface Ag: NEGATIVE

## 2019-03-14 LAB — ANTINUCLEAR ANTIBODIES, IFA: ANA Ab, IFA: POSITIVE — AB

## 2019-03-14 LAB — RHEUMATOID FACTOR: Rheumatoid fact SerPl-aCnc: 27 IU/mL — ABNORMAL HIGH (ref 0.0–13.9)

## 2019-03-15 LAB — METHYLMALONIC ACID, SERUM: Methylmalonic Acid, Quantitative: 444 nmol/L — ABNORMAL HIGH (ref 0–378)

## 2019-03-15 LAB — COPPER, SERUM: Copper: 161 ug/dL (ref 72–166)

## 2019-04-04 ENCOUNTER — Other Ambulatory Visit: Payer: Self-pay

## 2019-04-04 ENCOUNTER — Inpatient Hospital Stay (HOSPITAL_COMMUNITY): Payer: Medicare Other | Attending: Hematology | Admitting: Hematology

## 2019-04-04 ENCOUNTER — Encounter (HOSPITAL_COMMUNITY): Payer: Self-pay | Admitting: Hematology

## 2019-04-04 VITALS — BP 186/81 | HR 64 | Temp 98.4°F | Resp 16 | Wt 211.0 lb

## 2019-04-04 DIAGNOSIS — Z85118 Personal history of other malignant neoplasm of bronchus and lung: Secondary | ICD-10-CM | POA: Insufficient documentation

## 2019-04-04 DIAGNOSIS — Z853 Personal history of malignant neoplasm of breast: Secondary | ICD-10-CM | POA: Diagnosis not present

## 2019-04-04 DIAGNOSIS — D61818 Other pancytopenia: Secondary | ICD-10-CM | POA: Insufficient documentation

## 2019-04-04 DIAGNOSIS — Z87891 Personal history of nicotine dependence: Secondary | ICD-10-CM | POA: Diagnosis not present

## 2019-04-04 DIAGNOSIS — Z79899 Other long term (current) drug therapy: Secondary | ICD-10-CM | POA: Diagnosis not present

## 2019-04-04 DIAGNOSIS — R0602 Shortness of breath: Secondary | ICD-10-CM | POA: Insufficient documentation

## 2019-04-04 DIAGNOSIS — R42 Dizziness and giddiness: Secondary | ICD-10-CM | POA: Insufficient documentation

## 2019-04-04 DIAGNOSIS — Z923 Personal history of irradiation: Secondary | ICD-10-CM | POA: Diagnosis not present

## 2019-04-04 DIAGNOSIS — R200 Anesthesia of skin: Secondary | ICD-10-CM | POA: Insufficient documentation

## 2019-04-04 DIAGNOSIS — E538 Deficiency of other specified B group vitamins: Secondary | ICD-10-CM | POA: Diagnosis not present

## 2019-04-04 DIAGNOSIS — Z803 Family history of malignant neoplasm of breast: Secondary | ICD-10-CM | POA: Diagnosis not present

## 2019-04-04 MED ORDER — CYANOCOBALAMIN 1000 MCG/ML IJ SOLN
1000.0000 ug | Freq: Once | INTRAMUSCULAR | Status: AC
  Administered 2019-04-04: 16:00:00 1000 ug via INTRAMUSCULAR

## 2019-04-04 MED ORDER — CYANOCOBALAMIN 1000 MCG/ML IJ SOLN
INTRAMUSCULAR | Status: AC
  Filled 2019-04-04: qty 1

## 2019-04-04 NOTE — Assessment & Plan Note (Addendum)
1.  Pancytopenia: - Patient referred to [place] from Dr. Lavone Neri for further work-up of pancytopenia. - CBC on 03/01/2019 shows white count of 3.3, hemoglobin of 9.7, MCV of 94, platelet count 147 with 22% neutrophils with ANC of 700.  B12 level was normal. -Prior to that CBC on 01/11/2019 shows white count of 3.7, hemoglobin 9.9 and platelet count of 159 with ANC of 1100. -CBC on 10/04/2018 showed white count of 4.6, hemoglobin 11.1 and platelet count of 154 with ANC of 1200. -She had a history of breast cancer in 1999, treated with lumpectomy and radiation therapy.  Never received chemotherapy. - She has occasional numbness in the fingertips and toes at nighttime.  She smoked 1 pack/day of cigarettes for 20 years and quit at age 38.   - Differential diagnosis includes bone marrow infiltrative process versus MDS. -However the blood work sent from our office shows elevated methylmalonic acid consistent with B12 deficiency.  She has mild leukopenia with a white count of 3.9, ANC of 1200.  Hemoglobin is around 10.2.  I will start her on B12 injection today and she will start taking B12 1 mg tablet daily.  I will reevaluate her blood counts in 4 weeks. -Other work-up including folic acid was normal.  Her ANA was positive and 1 and 80 dilution, homogeneous pattern.  Rheumatoid factor was also weakly positive.  2.  Stage Ia left breast cancer: - Diagnosed in September 1999, treated with lumpectomy and XRT.  ER/PR was positive.  She received tamoxifen for 5 years. -Mammogram on 10/24/2018 was BI-RADS Category 1.  3.  Stage I adenocarcinoma of the left upper lobe of the lung: - She underwent left upper lobectomy on 06/01/2005. -Last CT chest without contrast on 06/09/2018 shows stable exam with no new or progressive findings.

## 2019-04-04 NOTE — Patient Instructions (Signed)
[place] at [facility] Discharge Instructions  You were seen today by Dr. Delton Coombes. He went over your recent lab results. He will see you back in 4 weeks  for labs and follow up.  Start taking B 12 1,041mcg daily.  Thank you for choosing [place] at [facility] to provide your oncology and hematology care.  To afford each patient quality time with our provider, please arrive at least 15 minutes before your scheduled appointment time.   If you have a lab appointment with the [facility] please come in thru the  Main Entrance and check in at the main information desk  You need to re-schedule your appointment should you arrive 10 or more minutes late.  We strive to give you quality time with our providers, and arriving late affects you and other patients whose appointments are after yours.  Also, if you no show three or more times for appointments you may be dismissed from the clinic at the providers discretion.     Again, thank you for choosing [facility].  Our hope is that these requests will decrease the amount of time that you wait before being seen by our physicians.       _____________________________________________________________  Should you have questions after your visit to [facility], please contact our office at [phone] between the hours of 8:00 a.m. and 4:30 p.m.  Voicemails left after 4:00 p.m. will not be returned until the following business day.  For prescription refill requests, have your pharmacy contact our office and allow 72 hours.    Cancer Center Support Programs:   > Cancer Support Group  2nd Tuesday of the month 1pm-2pm, Journey Room

## 2019-04-04 NOTE — Progress Notes (Signed)
[address] [place], [place] [address]   CLINIC:  Medical Oncology/Hematology  PCP:  Celene Squibb, MD [place] [address] [phone]   REASON FOR VISIT:  Follow-up forPancytopenia    BRIEF ONCOLOGIC HISTORY:    Adenocarcinoma of left lung ([place])   06/23/2016 Imaging    CT chest- Stable exam. No new or progressive findings. Previously described tiny perifissural nodules at the base of the right major fissure are stable and likely represent subpleural lymph nodes.      CANCER STAGING: Cancer Staging Invasive ductal carcinoma of left breast [facility]) Staging form: Breast, AJCC 7th Edition - Pathologic stage from 07/03/1998: Stage IA (T1a, N0, cM0) - Signed by Baird Cancer, PA-C on 06/09/2016    INTERVAL HISTORY:  Lisa [name] 85 y.o. female returns for routine follow-up.  She was evaluated in our clinic for pancytopenia at last visit.  She does have some numbness and tingling in the hands and feet at nighttime.  Shortness of breath on exertion has been stable.  Appetite is 100% and energy levels are 50%.  Denies any fevers, night sweats or weight loss in the last 6 months.  She was an ex-smoker who quit at age 39.  Denies any new onset pains.    REVIEW OF SYSTEMS:  Review of Systems  Respiratory: Positive for shortness of breath.   Neurological: Positive for dizziness and numbness.  All other systems reviewed and are negative.    PAST MEDICAL/SURGICAL HISTORY:  Past Medical History:  Diagnosis Date   Adenocarcinoma of left lung ([place]) 2006   Arthritis    Asthma    Cancer of breast, female ([place])    Cancer of lung ([place])    Hypertension    Hypothyroidism    Invasive ductal carcinoma of left breast ([place]) 1999   Neutropenia ([place]) 06/09/2016   Personal history of radiation therapy    Past Surgical History:  Procedure Laterality Date   BREAST BIOPSY     left axillary node dissection   BREAST LUMPECTOMY Left      COLONOSCOPY  03/08/2003   HCW:CBJSEGBTDV rectal polyps destroyed with the tip of the snare/Polyps at hepatic flexure, splenic flexure at 35 cm/Left-sided diverticula: unable to retrieve path   COLONOSCOPY  09/12/2008   VOH:YWVPXT rectum and distal sigmoid diminutive polyps/scattered left sided diverticulum. hyperplastic   COLONOSCOPY N/A 03/06/2013   GGY:IRSWNIO polyp-removed as described above; colonic diverticulosis. hyperplastic polyps. next TCS 03/2018   FOOT SURGERY     LUNG REMOVAL, PARTIAL     upper lobe     SOCIAL HISTORY:  Social History   Socioeconomic History   Marital status: Widowed    Spouse name: Not on file   Number of children: Not on file   Years of education: Not on file   Highest education level: Not on file  Occupational History   Not on file  Social Needs   Financial resource strain: Not on file   Food insecurity:    Worry: Not on file    Inability: Not on file   Transportation needs:    Medical: Not on file    Non-medical: Not on file  Tobacco Use   Smoking status: Former Smoker    Packs/day: 0.75    Years: 35.00    Pack years: 26.25    Last attempt to quit: 1993    Years since quitting: 27.4   Smokeless tobacco: Never Used   Tobacco comment:  smoke-free X 30 yeras  Substance and Sexual Activity   Alcohol use: No   Drug use: No   Sexual activity: Not on file  Lifestyle   Physical activity:    Days per week: Not on file    Minutes per session: Not on file   Stress: Not on file  Relationships   Social connections:    Talks on phone: Not on file    Gets together: Not on file    Attends religious service: Not on file    Active member of club or organization: Not on file    Attends meetings of clubs or organizations: Not on file    Relationship status: Not on file   Intimate partner violence:    Fear of current or ex partner: Not on file    Emotionally abused: Not on file    Physically abused: Not on file    Forced  sexual activity: Not on file  Other Topics Concern   Not on file  Social History Narrative   Not on file    FAMILY HISTORY:  Family History  Problem Relation Age of Onset   Cancer Mother    Cancer Sister    Colon cancer Neg Hx     CURRENT MEDICATIONS:  Outpatient Encounter Medications as of 04/04/2019  Medication Sig   albuterol (PROVENTIL,VENTOLIN) 90 MCG/ACT inhaler Inhale 2 puffs into the lungs every 6 (six) hours as needed for wheezing.   clobetasol ointment (TEMOVATE) 0.05 %    ferrous sulfate 325 (65 FE) MG tablet Take 325 mg by mouth daily with breakfast.   gabapentin (NEURONTIN) 600 MG tablet    levothyroxine (SYNTHROID, LEVOTHROID) 88 MCG tablet Take 88 mcg by mouth daily.    meloxicam (MOBIC) 7.5 MG tablet    metoprolol succinate (TOPROL-XL) 50 MG 24 hr tablet Take 50 mg by mouth daily. Take with or immediately following a meal.   Multiple Vitamin (MULTIVITAMIN WITH MINERALS) TABS tablet Take 1 tablet by mouth daily.   telmisartan-hydrochlorothiazide (MICARDIS HCT) 80-12.5 MG per tablet Take 1 tablet by mouth daily.   TRELEGY ELLIPTA 100-62.5-25 MCG/INH AEPB    VITAMIN D, ERGOCALCIFEROL, PO Take 1 capsule by mouth daily.   [EXPIRED] cyanocobalamin ((VITAMIN B-12)) injection 1,000 mcg    No facility-administered encounter medications on file as of 04/04/2019.     ALLERGIES:  No Known Allergies   PHYSICAL EXAM:  ECOG Performance status: 1  Vitals:   04/04/19 1548  BP: (!) 186/81  Pulse: 64  Resp: 16  Temp: 98.4 F (36.9 C)  SpO2: 90%   Filed Weights   04/04/19 1548  Weight: 211 lb (95.7 kg)    Physical Exam Vitals signs reviewed.  Constitutional:      Appearance: Normal appearance.  Cardiovascular:     Rate and Rhythm: Normal rate and regular rhythm.     Heart sounds: Normal heart sounds.  Pulmonary:     Effort: Pulmonary effort is normal.     Breath sounds: Normal breath sounds.  Abdominal:     General: There is no distension.      Palpations: Abdomen is soft. There is no mass.  Musculoskeletal:        General: No swelling.  Skin:    General: Skin is warm.  Neurological:     General: No focal deficit present.     Mental Status: She is alert and oriented to person, place, and time.  Psychiatric:        Mood and Affect:  Mood normal.        Behavior: Behavior normal.      LABORATORY DATA:  I have reviewed the labs as listed.  CBC    Component Value Date/Time   WBC 3.9 (L) 03/13/2019 1451   RBC 3.54 (L) 03/13/2019 1451   RBC 3.54 (L) 03/13/2019 1451   HGB 10.2 (L) 03/13/2019 1451   HCT 34.5 (L) 03/13/2019 1451   PLT 164 03/13/2019 1451   MCV 97.5 03/13/2019 1451   MCH 28.8 03/13/2019 1451   MCHC 29.6 (L) 03/13/2019 1451   RDW 17.0 (H) 03/13/2019 1451   LYMPHSABS 1.5 03/13/2019 1451   MONOABS 1.1 (H) 03/13/2019 1451   EOSABS 0.0 03/13/2019 1451   BASOSABS 0.0 03/13/2019 1451   CMP Latest Ref Rng & Units 03/13/2019 06/01/2017 05/31/2017  Glucose 70 - 99 mg/dL 89 162(H) 116(H)  BUN 8 - 23 mg/dL 22 28(H) 16  Creatinine 0.44 - 1.00 mg/dL 1.08(H) 1.19(H) 0.99  Sodium 135 - 145 mmol/L 136 137 139  Potassium 3.5 - 5.1 mmol/L 3.9 4.2 3.7  Chloride 98 - 111 mmol/L 100 97(L) 100(L)  CO2 22 - 32 mmol/L 26 29 29   Calcium 8.9 - 10.3 mg/dL 9.1 8.9 9.7  Total Protein 6.5 - 8.1 g/dL 8.5(H) - 8.2(H)  Total Bilirubin 0.3 - 1.2 mg/dL 0.7 - 1.4(H)  Alkaline Phos 38 - 126 U/L 58 - 58  AST 15 - 41 U/L 14(L) - 24  ALT 0 - 44 U/L 10 - 24       DIAGNOSTIC IMAGING:  I have independently reviewed the scans and discussed with the patient.   I have reviewed Venita Lick LPN's note and agree with the documentation.  I personally performed a face-to-face visit, made revisions and my assessment and plan is as follows.    ASSESSMENT & PLAN:   Other pancytopenia ([place]) 1.  Pancytopenia: - Patient referred to [place] from Dr. Lavone Neri for further work-up of pancytopenia. - CBC on 03/01/2019 shows white count of 3.3,  hemoglobin of 9.7, MCV of 94, platelet count 147 with 22% neutrophils with ANC of 700.  B12 level was normal. -Prior to that CBC on 01/11/2019 shows white count of 3.7, hemoglobin 9.9 and platelet count of 159 with ANC of 1100. -CBC on 10/04/2018 showed white count of 4.6, hemoglobin 11.1 and platelet count of 154 with ANC of 1200. -She had a history of breast cancer in 1999, treated with lumpectomy and radiation therapy.  Never received chemotherapy. - She has occasional numbness in the fingertips and toes at nighttime.  She smoked 1 pack/day of cigarettes for 20 years and quit at age 73.   - Differential diagnosis includes bone marrow infiltrative process versus MDS. -However the blood work sent from our office shows elevated methylmalonic acid consistent with B12 deficiency.  She has mild leukopenia with a white count of 3.9, ANC of 1200.  Hemoglobin is around 10.2.  I will start her on B12 injection today and she will start taking B12 1 mg tablet daily.  I will reevaluate her blood counts in 4 weeks. -Other work-up including folic acid was normal.  Her ANA was positive and 1 and 80 dilution, homogeneous pattern.  Rheumatoid factor was also weakly positive.  2.  Stage Ia left breast cancer: - Diagnosed in September 1999, treated with lumpectomy and XRT.  ER/PR was positive.  She received tamoxifen for 5 years. -Mammogram on 10/24/2018 was BI-RADS Category 1.  3.  Stage I adenocarcinoma of the  left upper lobe of the lung: - She underwent left upper lobectomy on 06/01/2005. -Last CT chest without contrast on 06/09/2018 shows stable exam with no new or progressive findings.      Orders placed this encounter:  Orders Placed This Encounter  Procedures   CBC with Differential/Platelet   Comprehensive metabolic panel   Lactate dehydrogenase   Vitamin B12   Methylmalonic acid, serum      Derek Jack, MD [place] [phone]

## 2019-04-26 ENCOUNTER — Inpatient Hospital Stay (HOSPITAL_COMMUNITY): Payer: Medicare Other

## 2019-04-26 ENCOUNTER — Other Ambulatory Visit: Payer: Self-pay

## 2019-04-26 DIAGNOSIS — R0602 Shortness of breath: Secondary | ICD-10-CM | POA: Diagnosis not present

## 2019-04-26 DIAGNOSIS — Z79899 Other long term (current) drug therapy: Secondary | ICD-10-CM | POA: Diagnosis not present

## 2019-04-26 DIAGNOSIS — D61818 Other pancytopenia: Secondary | ICD-10-CM

## 2019-04-26 DIAGNOSIS — R200 Anesthesia of skin: Secondary | ICD-10-CM | POA: Diagnosis not present

## 2019-04-26 DIAGNOSIS — E538 Deficiency of other specified B group vitamins: Secondary | ICD-10-CM | POA: Diagnosis not present

## 2019-04-26 DIAGNOSIS — R42 Dizziness and giddiness: Secondary | ICD-10-CM | POA: Diagnosis not present

## 2019-04-26 LAB — CBC WITH DIFFERENTIAL/PLATELET
Abs Immature Granulocytes: 0.2 10*3/uL — ABNORMAL HIGH (ref 0.00–0.07)
Basophils Absolute: 0 10*3/uL (ref 0.0–0.1)
Basophils Relative: 0 %
Eosinophils Absolute: 0 10*3/uL (ref 0.0–0.5)
Eosinophils Relative: 0 %
HCT: 38.2 % (ref 36.0–46.0)
Hemoglobin: 11 g/dL — ABNORMAL LOW (ref 12.0–15.0)
Immature Granulocytes: 5 %
Lymphocytes Relative: 39 %
Lymphs Abs: 1.7 10*3/uL (ref 0.7–4.0)
MCH: 29.3 pg (ref 26.0–34.0)
MCHC: 28.8 g/dL — ABNORMAL LOW (ref 30.0–36.0)
MCV: 101.6 fL — ABNORMAL HIGH (ref 80.0–100.0)
Monocytes Absolute: 1.2 10*3/uL — ABNORMAL HIGH (ref 0.1–1.0)
Monocytes Relative: 29 %
Neutro Abs: 1.1 10*3/uL — ABNORMAL LOW (ref 1.7–7.7)
Neutrophils Relative %: 27 %
Platelets: 151 10*3/uL (ref 150–400)
RBC: 3.76 MIL/uL — ABNORMAL LOW (ref 3.87–5.11)
RDW: 17.4 % — ABNORMAL HIGH (ref 11.5–15.5)
WBC: 4.2 10*3/uL (ref 4.0–10.5)
nRBC: 0.9 % — ABNORMAL HIGH (ref 0.0–0.2)

## 2019-04-26 LAB — COMPREHENSIVE METABOLIC PANEL
ALT: 10 U/L (ref 0–44)
AST: 15 U/L (ref 15–41)
Albumin: 3.9 g/dL (ref 3.5–5.0)
Alkaline Phosphatase: 65 U/L (ref 38–126)
Anion gap: 11 (ref 5–15)
BUN: 18 mg/dL (ref 8–23)
CO2: 26 mmol/L (ref 22–32)
Calcium: 9.3 mg/dL (ref 8.9–10.3)
Chloride: 103 mmol/L (ref 98–111)
Creatinine, Ser: 1.06 mg/dL — ABNORMAL HIGH (ref 0.44–1.00)
GFR calc Af Amer: 56 mL/min — ABNORMAL LOW (ref >60)
GFR calc non Af Amer: 49 mL/min — ABNORMAL LOW (ref >60)
Glucose, Bld: 91 mg/dL (ref 70–99)
Potassium: 4.2 mmol/L (ref 3.5–5.1)
Sodium: 140 mmol/L (ref 135–145)
Total Bilirubin: 0.6 mg/dL (ref 0.3–1.2)
Total Protein: 8.7 g/dL — ABNORMAL HIGH (ref 6.5–8.1)

## 2019-04-26 LAB — LACTATE DEHYDROGENASE: LDH: 205 U/L — ABNORMAL HIGH (ref 98–192)

## 2019-04-26 LAB — VITAMIN B12: Vitamin B-12: 434 pg/mL (ref 180–914)

## 2019-04-30 LAB — METHYLMALONIC ACID, SERUM: Methylmalonic Acid, Quantitative: 263 nmol/L (ref 0–378)

## 2019-05-03 ENCOUNTER — Inpatient Hospital Stay (HOSPITAL_COMMUNITY): Payer: Medicare Other | Attending: Hematology | Admitting: Hematology

## 2019-05-03 ENCOUNTER — Other Ambulatory Visit: Payer: Self-pay

## 2019-05-03 ENCOUNTER — Encounter (HOSPITAL_COMMUNITY): Payer: Self-pay | Admitting: Hematology

## 2019-05-03 VITALS — BP 158/67 | HR 62 | Temp 97.6°F | Resp 18 | Wt 207.1 lb

## 2019-05-03 DIAGNOSIS — M79641 Pain in right hand: Secondary | ICD-10-CM | POA: Insufficient documentation

## 2019-05-03 DIAGNOSIS — Z85118 Personal history of other malignant neoplasm of bronchus and lung: Secondary | ICD-10-CM | POA: Insufficient documentation

## 2019-05-03 DIAGNOSIS — Z853 Personal history of malignant neoplasm of breast: Secondary | ICD-10-CM | POA: Diagnosis not present

## 2019-05-03 DIAGNOSIS — G629 Polyneuropathy, unspecified: Secondary | ICD-10-CM | POA: Insufficient documentation

## 2019-05-03 DIAGNOSIS — R0602 Shortness of breath: Secondary | ICD-10-CM | POA: Insufficient documentation

## 2019-05-03 DIAGNOSIS — D61818 Other pancytopenia: Secondary | ICD-10-CM | POA: Diagnosis not present

## 2019-05-03 DIAGNOSIS — Z803 Family history of malignant neoplasm of breast: Secondary | ICD-10-CM | POA: Diagnosis not present

## 2019-05-03 DIAGNOSIS — M79642 Pain in left hand: Secondary | ICD-10-CM | POA: Insufficient documentation

## 2019-05-03 DIAGNOSIS — Z79899 Other long term (current) drug therapy: Secondary | ICD-10-CM | POA: Diagnosis not present

## 2019-05-03 DIAGNOSIS — Z87891 Personal history of nicotine dependence: Secondary | ICD-10-CM | POA: Diagnosis not present

## 2019-05-03 DIAGNOSIS — R200 Anesthesia of skin: Secondary | ICD-10-CM | POA: Insufficient documentation

## 2019-05-03 DIAGNOSIS — Z923 Personal history of irradiation: Secondary | ICD-10-CM | POA: Diagnosis not present

## 2019-05-03 NOTE — Progress Notes (Signed)
[place] [place], [place] [address]   CLINIC:  Medical Oncology/Hematology  PCP:  Celene Squibb, MD [place] [address] [phone]   REASON FOR VISIT:  Follow-up forPancytopenia    BRIEF ONCOLOGIC HISTORY:  Oncology History  Adenocarcinoma of left lung ([place])  06/23/2016 Imaging   CT chest- Stable exam. No new or progressive findings. Previously described tiny perifissural nodules at the base of the right major fissure are stable and likely represent subpleural lymph nodes.      CANCER STAGING: Cancer Staging Invasive ductal carcinoma of left breast [facility]) Staging form: Breast, AJCC 7th Edition - Pathologic stage from 07/03/1998: Stage IA (T1a, N0, cM0) - Signed by Baird Cancer, PA-C on 06/09/2016    INTERVAL HISTORY:  Ms. Poznanski 85 y.o. female seen for follow-up of her pancytopenia.  She is taking B12 tablet daily without fail.  Appetite is 100%.  Energy levels are 50%.  She reported pain in her hands for the last 2 weeks.  Pain is described as achy in nature.  It is predominantly at nighttime.  She is currently taking gabapentin 600 mg 3 times a day for neuropathy in the hands and feet.  She has numbness.  Cough and shortness of breath on exertion has been stable.  Sleep problems secondary to pain in the hands.   REVIEW OF SYSTEMS:  Review of Systems  Respiratory: Positive for shortness of breath.   Neurological: Positive for numbness. Negative for dizziness.  Psychiatric/Behavioral: Positive for sleep disturbance.  All other systems reviewed and are negative.    PAST MEDICAL/SURGICAL HISTORY:  Past Medical History:  Diagnosis Date   Adenocarcinoma of left lung ([place]) 2006   Arthritis    Asthma    Cancer of breast, female ([place])    Cancer of lung ([place])    Hypertension    Hypothyroidism    Invasive ductal carcinoma of left breast ([place]) 1999   Neutropenia ([place]) 06/09/2016   Personal history of  radiation therapy    Past Surgical History:  Procedure Laterality Date   BREAST BIOPSY     left axillary node dissection   BREAST LUMPECTOMY Left    COLONOSCOPY  03/08/2003   WSF:KCLEXNTZGY rectal polyps destroyed with the tip of the snare/Polyps at hepatic flexure, splenic flexure at 35 cm/Left-sided diverticula: unable to retrieve path   COLONOSCOPY  09/12/2008   FVC:BSWHQP rectum and distal sigmoid diminutive polyps/scattered left sided diverticulum. hyperplastic   COLONOSCOPY N/A 03/06/2013   RFF:MBWGYKZ polyp-removed as described above; colonic diverticulosis. hyperplastic polyps. next TCS 03/2018   FOOT SURGERY     LUNG REMOVAL, PARTIAL     upper lobe     SOCIAL HISTORY:  Social History   Socioeconomic History   Marital status: Widowed    Spouse name: Not on file   Number of children: Not on file   Years of education: Not on file   Highest education level: Not on file  Occupational History   Not on file  Social Needs   Financial resource strain: Not on file   Food insecurity    Worry: Not on file    Inability: Not on file   Transportation needs    Medical: Not on file    Non-medical: Not on file  Tobacco Use   Smoking status: Former Smoker    Packs/day: 0.75    Years: 35.00    Pack years: 26.25    Quit date: 1993  Years since quitting: 27.5   Smokeless tobacco: Never Used   Tobacco comment: smoke-free X 30 yeras  Substance and Sexual Activity   Alcohol use: No   Drug use: No   Sexual activity: Not on file  Lifestyle   Physical activity    Days per week: Not on file    Minutes per session: Not on file   Stress: Not on file  Relationships   Social connections    Talks on phone: Not on file    Gets together: Not on file    Attends religious service: Not on file    Active member of club or organization: Not on file    Attends meetings of clubs or organizations: Not on file    Relationship status: Not on file   Intimate partner  violence    Fear of current or ex partner: Not on file    Emotionally abused: Not on file    Physically abused: Not on file    Forced sexual activity: Not on file  Other Topics Concern   Not on file  Social History Narrative   Not on file    FAMILY HISTORY:  Family History  Problem Relation Age of Onset   Cancer Mother    Cancer Sister    Colon cancer Neg Hx     CURRENT MEDICATIONS:  Outpatient Encounter Medications as of 05/03/2019  Medication Sig   albuterol (PROVENTIL,VENTOLIN) 90 MCG/ACT inhaler Inhale 2 puffs into the lungs every 6 (six) hours as needed for wheezing.   clobetasol ointment (TEMOVATE) 0.05 %    ferrous sulfate 325 (65 FE) MG tablet Take 325 mg by mouth daily with breakfast.   gabapentin (NEURONTIN) 600 MG tablet    levothyroxine (SYNTHROID, LEVOTHROID) 88 MCG tablet Take 88 mcg by mouth daily.    meloxicam (MOBIC) 7.5 MG tablet    metoprolol succinate (TOPROL-XL) 50 MG 24 hr tablet Take 50 mg by mouth daily. Take with or immediately following a meal.   Multiple Vitamin (MULTIVITAMIN WITH MINERALS) TABS tablet Take 1 tablet by mouth daily.   telmisartan-hydrochlorothiazide (MICARDIS HCT) 80-12.5 MG per tablet Take 1 tablet by mouth daily.   TRELEGY ELLIPTA 100-62.5-25 MCG/INH AEPB    VITAMIN D, ERGOCALCIFEROL, PO Take 1 capsule by mouth daily.   No facility-administered encounter medications on file as of 05/03/2019.     ALLERGIES:  No Known Allergies   PHYSICAL EXAM:  ECOG Performance status: 1  Vitals:   05/03/19 1144  BP: (!) 158/67  Pulse: 62  Resp: 18  Temp: 97.6 F (36.4 C)  SpO2: 96%   Filed Weights   05/03/19 1144  Weight: 207 lb 1.6 oz (93.9 kg)    Physical Exam Vitals signs reviewed.  Constitutional:      Appearance: Normal appearance.  Cardiovascular:     Rate and Rhythm: Normal rate and regular rhythm.     Heart sounds: Normal heart sounds.  Pulmonary:     Effort: Pulmonary effort is normal.     Breath  sounds: Normal breath sounds.  Abdominal:     General: There is no distension.     Palpations: Abdomen is soft. There is no mass.  Musculoskeletal:        General: No swelling.  Skin:    General: Skin is warm.  Neurological:     General: No focal deficit present.     Mental Status: She is alert and oriented to person, place, and time.  Psychiatric:  Mood and Affect: Mood normal.        Behavior: Behavior normal.      LABORATORY DATA:  I have reviewed the labs as listed.  CBC    Component Value Date/Time   WBC 4.2 04/26/2019 1230   RBC 3.76 (L) 04/26/2019 1230   HGB 11.0 (L) 04/26/2019 1230   HCT 38.2 04/26/2019 1230   PLT 151 04/26/2019 1230   MCV 101.6 (H) 04/26/2019 1230   MCH 29.3 04/26/2019 1230   MCHC 28.8 (L) 04/26/2019 1230   RDW 17.4 (H) 04/26/2019 1230   LYMPHSABS 1.7 04/26/2019 1230   MONOABS 1.2 (H) 04/26/2019 1230   EOSABS 0.0 04/26/2019 1230   BASOSABS 0.0 04/26/2019 1230   CMP Latest Ref Rng & Units 04/26/2019 03/13/2019 06/01/2017  Glucose 70 - 99 mg/dL 91 89 162(H)  BUN 8 - 23 mg/dL 18 22 28(H)  Creatinine 0.44 - 1.00 mg/dL 1.06(H) 1.08(H) 1.19(H)  Sodium 135 - 145 mmol/L 140 136 137  Potassium 3.5 - 5.1 mmol/L 4.2 3.9 4.2  Chloride 98 - 111 mmol/L 103 100 97(L)  CO2 22 - 32 mmol/L 26 26 29   Calcium 8.9 - 10.3 mg/dL 9.3 9.1 8.9  Total Protein 6.5 - 8.1 g/dL 8.7(H) 8.5(H) -  Total Bilirubin 0.3 - 1.2 mg/dL 0.6 0.7 -  Alkaline Phos 38 - 126 U/L 65 58 -  AST 15 - 41 U/L 15 14(L) -  ALT 0 - 44 U/L 10 10 -       DIAGNOSTIC IMAGING:  I have independently reviewed the scans and discussed with the patient.   I have reviewed Venita Lick LPN's note and agree with the documentation.  I personally performed a face-to-face visit, made revisions and my assessment and plan is as follows.    ASSESSMENT & PLAN:   Other pancytopenia ([place]) 1.  Pancytopenia: -Patient referred to [place] by Dr. [place] Crane for work-up of pancytopenia.  CBC on 03/01/2019 showed  white count of 3.3, hemoglobin 9.7, MCV of 94 with platelet count of 147 with 22% neutrophils and ANC of 700.  B12 was normal. - Her labs on 03/13/2019 showed increased methylmalonic acid with a normal B12 level consistent with B12 deficiency.  We gave her 1 shot of B12 injection and started her on B12 tablet daily. - Most recent labs on 04/26/2019 shows improvement of white count to 4.2, hemoglobin 11.  Absolute neutrophil count is 1100. -I have recommended her to continue B12 1 mg tablet daily.  We will reevaluate her in 3 months. -If there is any deterioration of her ANC, will consider bone marrow aspiration and biopsy.  Differential diagnosis includes MDS.  2.  Peripheral neuropathy: - She had some numbness in the fingertips and toes.  She was taking gabapentin 600 mg 3 times a day. -For the past 2 weeks, she reported increased pains in her hands.  Pains are described as achy in nature.  Aleve helps somewhat. -I have recommended her to increase gabapentin to 900 mg at bedtime until the pains resolve.  3.  Stage Ia left breast cancer: -Diagnosed in September 1999, treated with lumpectomy and XRT.  ER/PR was positive.  She received tamoxifen for 5 years. -Mammogram on 10/24/2018 was BI-RADS Category 1.  4.  Stage I adenocarcinoma of the left upper lobe of the lung: -She underwent left upper lobectomy on 06/01/2005. -Last CT of the chest with contrast on 06/09/2018 showed stable exam with no new findings.   Total time spent is 25 minutes  with more than 50% of the time spent face-to-face discussing blood work results, further treatment plan and counseling and coordination of care.  Orders placed this encounter:  Orders Placed This Encounter  Procedures   CBC with Differential/Platelet   Comprehensive metabolic panel   Vitamin D40   Methylmalonic acid, serum      Derek Jack, MD [place] [phone]

## 2019-05-03 NOTE — Patient Instructions (Addendum)
[facility] at [facility] Discharge Instructions  You were seen today by Dr. Katragadda. He went over your recent results. He will see you back in 3 months for labs and follow up.   Thank you for choosing [facility] at [facility] to provide your oncology and hematology care.  To afford each patient quality time with our provider, please arrive at least 15 minutes before your scheduled appointment time.   If you have a lab appointment with the Cancer Center please come in thru the  Main Entrance and check in at the main information desk  You need to re-schedule your appointment should you arrive 10 or more minutes late.  We strive to give you quality time with our providers, and arriving late affects you and other patients whose appointments are after yours.  Also, if you no show three or more times for appointments you may be dismissed from the clinic at the providers discretion.     Again, thank you for choosing  Cancer Center.  Our hope is that these requests will decrease the amount of time that you wait before being seen by our physicians.       _____________________________________________________________  Should you have questions after your visit to  Cancer Center, please contact our office at [phone] between the hours of 8:00 a.m. and 4:30 p.m.  Voicemails left after 4:00 p.m. will not be returned until the following business day.  For prescription refill requests, have your pharmacy contact our office and allow 72 hours.    Cancer Center Support Programs:   > Cancer Support Group  2nd Tuesday of the month 1pm-2pm, Journey Room    

## 2019-05-03 NOTE — Assessment & Plan Note (Signed)
1.  Pancytopenia: -Patient referred to [place] by Dr. [place] Crane for work-up of pancytopenia.  CBC on 03/01/2019 showed white count of 3.3, hemoglobin 9.7, MCV of 94 with platelet count of 147 with 22% neutrophils and ANC of 700.  B12 was normal. - Her labs on 03/13/2019 showed increased methylmalonic acid with a normal B12 level consistent with B12 deficiency.  We gave her 1 shot of B12 injection and started her on B12 tablet daily. - Most recent labs on 04/26/2019 shows improvement of white count to 4.2, hemoglobin 11.  Absolute neutrophil count is 1100. -I have recommended her to continue B12 1 mg tablet daily.  We will reevaluate her in 3 months. -If there is any deterioration of her ANC, will consider bone marrow aspiration and biopsy.  Differential diagnosis includes MDS.  2.  Peripheral neuropathy: - She had some numbness in the fingertips and toes.  She was taking gabapentin 600 mg 3 times a day. -For the past 2 weeks, she reported increased pains in her hands.  Pains are described as achy in nature.  Aleve helps somewhat. -I have recommended her to increase gabapentin to 900 mg at bedtime until the pains resolve.  3.  Stage Ia left breast cancer: -Diagnosed in September 1999, treated with lumpectomy and XRT.  ER/PR was positive.  She received tamoxifen for 5 years. -Mammogram on 10/24/2018 was BI-RADS Category 1.  4.  Stage I adenocarcinoma of the left upper lobe of the lung: -She underwent left upper lobectomy on 06/01/2005. -Last CT of the chest with contrast on 06/09/2018 showed stable exam with no new findings.

## 2019-06-12 ENCOUNTER — Other Ambulatory Visit (HOSPITAL_COMMUNITY): Payer: Medicare Other

## 2019-06-13 ENCOUNTER — Other Ambulatory Visit (HOSPITAL_COMMUNITY): Payer: Medicare Other

## 2019-06-13 ENCOUNTER — Other Ambulatory Visit: Payer: Self-pay

## 2019-06-13 ENCOUNTER — Ambulatory Visit (HOSPITAL_COMMUNITY)
Admission: RE | Admit: 2019-06-13 | Discharge: 2019-06-13 | Disposition: A | Discharge status: Home / Self Care | Payer: Medicare Other | Source: Ambulatory Visit | Attending: Internal Medicine | Admitting: Internal Medicine

## 2019-06-13 DIAGNOSIS — J432 Centrilobular emphysema: Secondary | ICD-10-CM | POA: Diagnosis not present

## 2019-06-13 DIAGNOSIS — C3490 Malignant neoplasm of unspecified part of unspecified bronchus or lung: Secondary | ICD-10-CM | POA: Diagnosis not present

## 2019-06-13 DIAGNOSIS — C50912 Malignant neoplasm of unspecified site of left female breast: Secondary | ICD-10-CM | POA: Insufficient documentation

## 2019-06-13 DIAGNOSIS — R918 Other nonspecific abnormal finding of lung field: Secondary | ICD-10-CM | POA: Diagnosis not present

## 2019-06-15 ENCOUNTER — Inpatient Hospital Stay (HOSPITAL_COMMUNITY): Payer: Medicare Other | Attending: Hematology | Admitting: Hematology

## 2019-06-15 ENCOUNTER — Other Ambulatory Visit: Payer: Self-pay

## 2019-06-15 ENCOUNTER — Inpatient Hospital Stay (HOSPITAL_COMMUNITY): Payer: Medicare Other

## 2019-06-15 VITALS — BP 155/54 | HR 71 | Temp 97.4°F | Resp 18 | Wt 214.6 lb

## 2019-06-15 DIAGNOSIS — R531 Weakness: Secondary | ICD-10-CM | POA: Insufficient documentation

## 2019-06-15 DIAGNOSIS — C3412 Malignant neoplasm of upper lobe, left bronchus or lung: Secondary | ICD-10-CM | POA: Diagnosis not present

## 2019-06-15 DIAGNOSIS — G629 Polyneuropathy, unspecified: Secondary | ICD-10-CM | POA: Diagnosis not present

## 2019-06-15 DIAGNOSIS — Z8719 Personal history of other diseases of the digestive system: Secondary | ICD-10-CM | POA: Insufficient documentation

## 2019-06-15 DIAGNOSIS — R269 Unspecified abnormalities of gait and mobility: Secondary | ICD-10-CM | POA: Diagnosis not present

## 2019-06-15 DIAGNOSIS — Z79899 Other long term (current) drug therapy: Secondary | ICD-10-CM | POA: Insufficient documentation

## 2019-06-15 DIAGNOSIS — M2550 Pain in unspecified joint: Secondary | ICD-10-CM | POA: Diagnosis not present

## 2019-06-15 DIAGNOSIS — M1990 Unspecified osteoarthritis, unspecified site: Secondary | ICD-10-CM | POA: Diagnosis not present

## 2019-06-15 DIAGNOSIS — D61818 Other pancytopenia: Secondary | ICD-10-CM | POA: Insufficient documentation

## 2019-06-15 DIAGNOSIS — Z809 Family history of malignant neoplasm, unspecified: Secondary | ICD-10-CM | POA: Insufficient documentation

## 2019-06-15 DIAGNOSIS — C50912 Malignant neoplasm of unspecified site of left female breast: Secondary | ICD-10-CM | POA: Diagnosis not present

## 2019-06-15 DIAGNOSIS — Z923 Personal history of irradiation: Secondary | ICD-10-CM | POA: Diagnosis not present

## 2019-06-15 DIAGNOSIS — Z87891 Personal history of nicotine dependence: Secondary | ICD-10-CM | POA: Insufficient documentation

## 2019-06-15 LAB — CBC WITH DIFFERENTIAL/PLATELET
Abs Immature Granulocytes: 0.12 10*3/uL — ABNORMAL HIGH (ref 0.00–0.07)
Basophils Absolute: 0 10*3/uL (ref 0.0–0.1)
Basophils Relative: 0 %
Eosinophils Absolute: 0 10*3/uL (ref 0.0–0.5)
Eosinophils Relative: 0 %
HCT: 37 % (ref 36.0–46.0)
Hemoglobin: 10.8 g/dL — ABNORMAL LOW (ref 12.0–15.0)
Immature Granulocytes: 3 %
Lymphocytes Relative: 31 %
Lymphs Abs: 1.5 10*3/uL (ref 0.7–4.0)
MCH: 29.3 pg (ref 26.0–34.0)
MCHC: 29.2 g/dL — ABNORMAL LOW (ref 30.0–36.0)
MCV: 100.3 fL — ABNORMAL HIGH (ref 80.0–100.0)
Monocytes Absolute: 1.4 10*3/uL — ABNORMAL HIGH (ref 0.1–1.0)
Monocytes Relative: 28 %
Neutro Abs: 1.9 10*3/uL (ref 1.7–7.7)
Neutrophils Relative %: 38 %
Platelets: 153 10*3/uL (ref 150–400)
RBC: 3.69 MIL/uL — ABNORMAL LOW (ref 3.87–5.11)
RDW: 15.5 % (ref 11.5–15.5)
WBC: 4.9 10*3/uL (ref 4.0–10.5)
nRBC: 0.4 % — ABNORMAL HIGH (ref 0.0–0.2)

## 2019-06-15 LAB — COMPREHENSIVE METABOLIC PANEL
ALT: 11 U/L (ref 0–44)
AST: 16 U/L (ref 15–41)
Albumin: 3.8 g/dL (ref 3.5–5.0)
Alkaline Phosphatase: 61 U/L (ref 38–126)
Anion gap: 6 (ref 5–15)
BUN: 22 mg/dL (ref 8–23)
CO2: 28 mmol/L (ref 22–32)
Calcium: 9.5 mg/dL (ref 8.9–10.3)
Chloride: 102 mmol/L (ref 98–111)
Creatinine, Ser: 1.11 mg/dL — ABNORMAL HIGH (ref 0.44–1.00)
GFR calc Af Amer: 53 mL/min — ABNORMAL LOW (ref >60)
GFR calc non Af Amer: 46 mL/min — ABNORMAL LOW (ref >60)
Glucose, Bld: 99 mg/dL (ref 70–99)
Potassium: 4.3 mmol/L (ref 3.5–5.1)
Sodium: 136 mmol/L (ref 135–145)
Total Bilirubin: 0.6 mg/dL (ref 0.3–1.2)
Total Protein: 8.6 g/dL — ABNORMAL HIGH (ref 6.5–8.1)

## 2019-06-15 LAB — VITAMIN B12: Vitamin B-12: 307 pg/mL (ref 180–914)

## 2019-06-15 NOTE — Progress Notes (Signed)
Lisa [name], [place] [id number]   CLINIC:  Medical Oncology/Hematology  PCP:  Celene Squibb, MD [place] [address] [phone]   REASON FOR VISIT:  Follow-up for Pancytopenia  CURRENT THERAPY: Clinical surveillance  BRIEF ONCOLOGIC HISTORY:  Oncology History  Adenocarcinoma of left lung ([place])  06/23/2016 Imaging   CT chest- Stable exam. No new or progressive findings. Previously described tiny perifissural nodules at the base of the right major fissure are stable and likely represent subpleural lymph nodes.      CANCER STAGING: Cancer Staging Invasive ductal carcinoma of left breast [facility]) Staging form: Breast, AJCC 7th Edition - Pathologic stage from 07/03/1998: Stage IA (T1a, N0, cM0) - Signed by Baird Cancer, PA-C on 06/09/2016    INTERVAL HISTORY:  Lisa [name] 85 y.o. female presents today for follow-up.  She reports overall doing well.  She denies any significant fatigue.  She denies any chest pain, shortness of breath, lightheadedness or dizziness.  She denies any recent infections or hospitalizations.  She denies any fevers, chills, night sweats.  No change in bowel habits.  Appetite is stable.  No weight loss.  She is here for repeat labs and office visit.   REVIEW OF SYSTEMS:  Review of Systems  Constitutional: Negative.   HENT:  Negative.   Eyes: Negative.   Respiratory: Negative.   Cardiovascular: Negative.   Gastrointestinal: Negative.   Genitourinary: Negative.    Musculoskeletal: Positive for arthralgias and gait problem.  Skin: Negative.   Neurological: Positive for extremity weakness and gait problem.  Hematological: Negative.   Psychiatric/Behavioral: Negative.      PAST MEDICAL/SURGICAL HISTORY:  Past Medical History:  Diagnosis Date  . Adenocarcinoma of left lung ([place]) 2006  . Arthritis   . Asthma   . Cancer of breast, female ([place])   . Cancer of lung ([place])   . Hypertension   .  Hypothyroidism   . Invasive ductal carcinoma of left breast ([place]) 1999  . Neutropenia ([place]) 06/09/2016  . Personal history of radiation therapy    Past Surgical History:  Procedure Laterality Date  . BREAST BIOPSY     left axillary node dissection  . BREAST LUMPECTOMY Left   . COLONOSCOPY  03/08/2003   KCL:EXNTZGYFVC rectal polyps destroyed with the tip of the snare/Polyps at hepatic flexure, splenic flexure at 35 cm/Left-sided diverticula: unable to retrieve path  . COLONOSCOPY  09/12/2008   BSW:HQPRFF rectum and distal sigmoid diminutive polyps/scattered left sided diverticulum. hyperplastic  . COLONOSCOPY N/A 03/06/2013   MBW:GYKZLDJ polyp-removed as described above; colonic diverticulosis. hyperplastic polyps. next TCS 03/2018  . FOOT SURGERY    . LUNG REMOVAL, PARTIAL     upper lobe     SOCIAL HISTORY:  Social History   Socioeconomic History  . Marital status: Widowed    Spouse name: Not on file  . Number of children: Not on file  . Years of education: Not on file  . Highest education level: Not on file  Occupational History  . Not on file  Social Needs  . Financial resource strain: Not on file  . Food insecurity    Worry: Not on file    Inability: Not on file  . Transportation needs    Medical: Not on file    Non-medical: Not on file  Tobacco Use  . Smoking status: Former Smoker    Packs/day: 0.75    Years: 35.00    Pack  years: 26.25    Quit date: 91    Years since quitting: 27.6  . Smokeless tobacco: Never Used  . Tobacco comment: smoke-free X 30 yeras  Substance and Sexual Activity  . Alcohol use: No  . Drug use: No  . Sexual activity: Not on file  Lifestyle  . Physical activity    Days per week: Not on file    Minutes per session: Not on file  . Stress: Not on file  Relationships  . Social Herbalist on phone: Not on file    Gets together: Not on file    Attends religious service: Not on file    Active member of club or organization: Not  on file    Attends meetings of clubs or organizations: Not on file    Relationship status: Not on file  . Intimate partner violence    Fear of current or ex partner: Not on file    Emotionally abused: Not on file    Physically abused: Not on file    Forced sexual activity: Not on file  Other Topics Concern  . Not on file  Social History Narrative  . Not on file    FAMILY HISTORY:  Family History  Problem Relation Age of Onset  . Cancer Mother   . Cancer Sister   . Colon cancer Neg Hx     CURRENT MEDICATIONS:  Outpatient Encounter Medications as of 06/15/2019  Medication Sig  . albuterol (PROVENTIL,VENTOLIN) 90 MCG/ACT inhaler Inhale 2 puffs into the lungs every 6 (six) hours as needed for wheezing.  . clobetasol ointment (TEMOVATE) 0.05 %   . ferrous sulfate 325 (65 FE) MG tablet Take 325 mg by mouth daily with breakfast.  . gabapentin (NEURONTIN) 600 MG tablet   . levothyroxine (SYNTHROID, LEVOTHROID) 88 MCG tablet Take 88 mcg by mouth daily.   . meloxicam (MOBIC) 7.5 MG tablet   . metoprolol succinate (TOPROL-XL) 50 MG 24 hr tablet Take 50 mg by mouth daily. Take with or immediately following a meal.  . Multiple Vitamin (MULTIVITAMIN WITH MINERALS) TABS tablet Take 1 tablet by mouth daily.  Marland Kitchen telmisartan-hydrochlorothiazide (MICARDIS HCT) 80-12.5 MG per tablet Take 1 tablet by mouth daily.  . TRELEGY ELLIPTA 100-62.5-25 MCG/INH AEPB   . VITAMIN D, ERGOCALCIFEROL, PO Take 1 capsule by mouth daily.   No facility-administered encounter medications on file as of 06/15/2019.     ALLERGIES:  No Known Allergies   PHYSICAL EXAM:  ECOG Performance status: 2  Vitals:   06/15/19 1129  BP: (!) 155/54  Pulse: 71  Resp: 18  Temp: (!) 97.4 F (36.3 C)  SpO2: 95%   Filed Weights   06/15/19 1129  Weight: 214 lb 9.6 oz (97.3 kg)    Physical Exam   LABORATORY DATA:  I have reviewed the labs as listed.  CBC    Component Value Date/Time   WBC 4.9 06/15/2019 1214   RBC  3.69 (L) 06/15/2019 1214   HGB 10.8 (L) 06/15/2019 1214   HCT 37.0 06/15/2019 1214   PLT 153 06/15/2019 1214   MCV 100.3 (H) 06/15/2019 1214   MCH 29.3 06/15/2019 1214   MCHC 29.2 (L) 06/15/2019 1214   RDW 15.5 06/15/2019 1214   LYMPHSABS 1.5 06/15/2019 1214   MONOABS 1.4 (H) 06/15/2019 1214   EOSABS 0.0 06/15/2019 1214   BASOSABS 0.0 06/15/2019 1214   CMP Latest Ref Rng & Units 06/15/2019 04/26/2019 03/13/2019  Glucose 70 - 99 mg/dL 99  91 89  BUN 8 - 23 mg/dL 22 18 22   Creatinine 0.44 - 1.00 mg/dL 1.11(H) 1.06(H) 1.08(H)  Sodium 135 - 145 mmol/L 136 140 136  Potassium 3.5 - 5.1 mmol/L 4.3 4.2 3.9  Chloride 98 - 111 mmol/L 102 103 100  CO2 22 - 32 mmol/L 28 26 26   Calcium 8.9 - 10.3 mg/dL 9.5 9.3 9.1  Total Protein 6.5 - 8.1 g/dL 8.6(H) 8.7(H) 8.5(H)  Total Bilirubin 0.3 - 1.2 mg/dL 0.6 0.6 0.7  Alkaline Phos 38 - 126 U/L 61 65 58  AST 15 - 41 U/L 16 15 14(L)  ALT 0 - 44 U/L 11 10 10        ASSESSMENT & PLAN:   Invasive ductal carcinoma of left breast (HCC) 1.  Pancytopenia: -Patient referred to [place] by Dr. [place] Crane for work-up of pancytopenia.  CBC on 03/01/2019 showed white count of 3.3, hemoglobin 9.7, MCV of 94 with platelet count of 147 with 22% neutrophils and ANC of 700.  B12 was normal. - Her labs on 03/13/2019 showed increased methylmalonic acid with a normal B12 level consistent with B12 deficiency.  We gave her 1 shot of B12 injection and started her on B12 tablet daily. - Hemoglobin remains stable, neutrophil count is actually improved to 1. 9 0.  She has no active signs of infection.  We will continue to monitor her blood counts. -If there is any deterioration of her ANC, will consider bone marrow aspiration and biopsy.  Differential diagnosis includes MDS. -Return to clinic in 2 months.   2.  Peripheral neuropathy: - She had some numbness in the fingertips and toes.  She was taking gabapentin 600 mg 3 times a day. -For the past 2 weeks, she reported increased pains in  her hands.  Pains are described as achy in nature.  Aleve helps somewhat. -I have recommended her to increase gabapentin to 900 mg at bedtime until the pains resolve.  3.  Stage Ia left breast cancer: -Diagnosed in September 1999, treated with lumpectomy and XRT.  ER/PR was positive.  She received tamoxifen for 5 years. -Mammogram on 10/24/2018 was BI-RADS Category 1.  4.  Stage I adenocarcinoma of the left upper lobe of the lung: -She underwent left upper lobectomy on 06/01/2005. -Last CT of the chest with contrast on 06/09/2018 showed stable exam with no new findings.      Orders placed this encounter:  Orders Placed This Encounter  Procedures  . CBC with Differential  . Comprehensive metabolic panel  . Vitamin B12  . CBC with Differential  . Comprehensive metabolic panel  . Vitamin B12      Roger Shelter, [place] [phone]

## 2019-06-15 NOTE — Assessment & Plan Note (Signed)
1.  Pancytopenia: -Patient referred to [place] by Dr. Nevada Crane for work-up of pancytopenia.  CBC on 03/01/2019 showed white count of 3.3, hemoglobin 9.7, MCV of 94 with platelet count of 147 with 22% neutrophils and ANC of 700.  B12 was normal. - Her labs on 03/13/2019 showed increased methylmalonic acid with a normal B12 level consistent with B12 deficiency.  We gave her 1 shot of B12 injection and started her on B12 tablet daily. - Hemoglobin remains stable, neutrophil count is actually improved to 1. 9 0.  She has no active signs of infection.  We will continue to monitor her blood counts. -If there is any deterioration of her ANC, will consider bone marrow aspiration and biopsy.  Differential diagnosis includes MDS. -Return to clinic in 2 months.   2.  Peripheral neuropathy: - She had some numbness in the fingertips and toes.  She was taking gabapentin 600 mg 3 times a day. -For the past 2 weeks, she reported increased pains in her hands.  Pains are described as achy in nature.  Aleve helps somewhat. -I have recommended her to increase gabapentin to 900 mg at bedtime until the pains resolve.  3.  Stage Ia left breast cancer: -Diagnosed in September 1999, treated with lumpectomy and XRT.  ER/PR was positive.  She received tamoxifen for 5 years. -Mammogram on 10/24/2018 was BI-RADS Category 1.  4.  Stage I adenocarcinoma of the left upper lobe of the lung: -She underwent left upper lobectomy on 06/01/2005. -Last CT of the chest with contrast on 06/09/2018 showed stable exam with no new findings.

## 2019-06-20 ENCOUNTER — Ambulatory Visit (HOSPITAL_COMMUNITY): Payer: Medicare Other | Admitting: Hematology

## 2019-07-27 ENCOUNTER — Other Ambulatory Visit: Payer: Self-pay

## 2019-07-27 ENCOUNTER — Other Ambulatory Visit (HOSPITAL_COMMUNITY): Payer: Medicare Other

## 2019-07-27 ENCOUNTER — Other Ambulatory Visit (HOSPITAL_COMMUNITY)
Admission: RE | Admit: 2019-07-27 | Discharge: 2019-07-27 | Disposition: A | Discharge status: Home / Self Care | Payer: Medicare Other | Source: Ambulatory Visit | Attending: Hematology | Admitting: Hematology

## 2019-07-27 DIAGNOSIS — D61818 Other pancytopenia: Secondary | ICD-10-CM | POA: Diagnosis not present

## 2019-07-27 LAB — CBC WITH DIFFERENTIAL/PLATELET
Abs Immature Granulocytes: 0.18 10*3/uL — ABNORMAL HIGH (ref 0.00–0.07)
Basophils Absolute: 0 10*3/uL (ref 0.0–0.1)
Basophils Relative: 0 %
Eosinophils Absolute: 0 10*3/uL (ref 0.0–0.5)
Eosinophils Relative: 0 %
HCT: 36.3 % (ref 36.0–46.0)
Hemoglobin: 10.3 g/dL — ABNORMAL LOW (ref 12.0–15.0)
Immature Granulocytes: 4 %
Lymphocytes Relative: 41 %
Lymphs Abs: 1.9 10*3/uL (ref 0.7–4.0)
MCH: 29.2 pg (ref 26.0–34.0)
MCHC: 28.4 g/dL — ABNORMAL LOW (ref 30.0–36.0)
MCV: 102.8 fL — ABNORMAL HIGH (ref 80.0–100.0)
Monocytes Absolute: 1.3 10*3/uL — ABNORMAL HIGH (ref 0.1–1.0)
Monocytes Relative: 29 %
Neutro Abs: 1.2 10*3/uL — ABNORMAL LOW (ref 1.7–7.7)
Neutrophils Relative %: 26 %
Platelets: 149 10*3/uL — ABNORMAL LOW (ref 150–400)
RBC: 3.53 MIL/uL — ABNORMAL LOW (ref 3.87–5.11)
RDW: 15.8 % — ABNORMAL HIGH (ref 11.5–15.5)
WBC: 4.7 10*3/uL (ref 4.0–10.5)
nRBC: 0 % (ref 0.0–0.2)

## 2019-07-31 DIAGNOSIS — D649 Anemia, unspecified: Secondary | ICD-10-CM | POA: Diagnosis not present

## 2019-07-31 DIAGNOSIS — Z853 Personal history of malignant neoplasm of breast: Secondary | ICD-10-CM | POA: Diagnosis not present

## 2019-07-31 DIAGNOSIS — I714 Abdominal aortic aneurysm, without rupture: Secondary | ICD-10-CM | POA: Diagnosis not present

## 2019-07-31 DIAGNOSIS — D61818 Other pancytopenia: Secondary | ICD-10-CM | POA: Diagnosis not present

## 2019-07-31 DIAGNOSIS — J9601 Acute respiratory failure with hypoxia: Secondary | ICD-10-CM | POA: Diagnosis not present

## 2019-07-31 DIAGNOSIS — Z85118 Personal history of other malignant neoplasm of bronchus and lung: Secondary | ICD-10-CM | POA: Diagnosis not present

## 2019-07-31 DIAGNOSIS — E039 Hypothyroidism, unspecified: Secondary | ICD-10-CM | POA: Diagnosis not present

## 2019-07-31 DIAGNOSIS — R634 Abnormal weight loss: Secondary | ICD-10-CM | POA: Diagnosis not present

## 2019-07-31 DIAGNOSIS — N183 Chronic kidney disease, stage 3 (moderate): Secondary | ICD-10-CM | POA: Diagnosis not present

## 2019-07-31 DIAGNOSIS — D696 Thrombocytopenia, unspecified: Secondary | ICD-10-CM | POA: Diagnosis not present

## 2019-07-31 DIAGNOSIS — I700 Atherosclerosis of aorta: Secondary | ICD-10-CM | POA: Diagnosis not present

## 2019-07-31 DIAGNOSIS — D72819 Decreased white blood cell count, unspecified: Secondary | ICD-10-CM | POA: Diagnosis not present

## 2019-07-31 DIAGNOSIS — G9009 Other idiopathic peripheral autonomic neuropathy: Secondary | ICD-10-CM | POA: Diagnosis not present

## 2019-07-31 DIAGNOSIS — M2550 Pain in unspecified joint: Secondary | ICD-10-CM | POA: Diagnosis not present

## 2019-07-31 DIAGNOSIS — D508 Other iron deficiency anemias: Secondary | ICD-10-CM | POA: Diagnosis not present

## 2019-07-31 DIAGNOSIS — J441 Chronic obstructive pulmonary disease with (acute) exacerbation: Secondary | ICD-10-CM | POA: Diagnosis not present

## 2019-08-03 ENCOUNTER — Ambulatory Visit (HOSPITAL_COMMUNITY): Payer: Medicare Other | Admitting: Hematology

## 2019-08-03 ENCOUNTER — Inpatient Hospital Stay (HOSPITAL_COMMUNITY): Payer: Medicare Other

## 2019-08-09 DIAGNOSIS — Z23 Encounter for immunization: Secondary | ICD-10-CM | POA: Diagnosis not present

## 2019-08-10 ENCOUNTER — Inpatient Hospital Stay (HOSPITAL_BASED_OUTPATIENT_CLINIC_OR_DEPARTMENT_OTHER): Payer: Medicare Other | Admitting: Nurse Practitioner

## 2019-08-10 ENCOUNTER — Encounter (HOSPITAL_COMMUNITY): Payer: Self-pay | Admitting: Nurse Practitioner

## 2019-08-10 ENCOUNTER — Ambulatory Visit (HOSPITAL_COMMUNITY): Payer: Medicare Other | Admitting: Hematology

## 2019-08-10 ENCOUNTER — Inpatient Hospital Stay (HOSPITAL_COMMUNITY): Payer: Medicare Other | Attending: Hematology

## 2019-08-10 ENCOUNTER — Other Ambulatory Visit: Payer: Self-pay

## 2019-08-10 VITALS — BP 169/83 | HR 86 | Temp 97.5°F | Resp 20 | Wt 219.3 lb

## 2019-08-10 DIAGNOSIS — Z809 Family history of malignant neoplasm, unspecified: Secondary | ICD-10-CM | POA: Diagnosis not present

## 2019-08-10 DIAGNOSIS — Z853 Personal history of malignant neoplasm of breast: Secondary | ICD-10-CM | POA: Diagnosis not present

## 2019-08-10 DIAGNOSIS — C50912 Malignant neoplasm of unspecified site of left female breast: Secondary | ICD-10-CM | POA: Insufficient documentation

## 2019-08-10 DIAGNOSIS — Z86010 Personal history of colonic polyps: Secondary | ICD-10-CM | POA: Insufficient documentation

## 2019-08-10 DIAGNOSIS — C3412 Malignant neoplasm of upper lobe, left bronchus or lung: Secondary | ICD-10-CM | POA: Insufficient documentation

## 2019-08-10 DIAGNOSIS — Z79899 Other long term (current) drug therapy: Secondary | ICD-10-CM | POA: Diagnosis not present

## 2019-08-10 DIAGNOSIS — D61818 Other pancytopenia: Secondary | ICD-10-CM

## 2019-08-10 DIAGNOSIS — Z1231 Encounter for screening mammogram for malignant neoplasm of breast: Secondary | ICD-10-CM

## 2019-08-10 DIAGNOSIS — Z87891 Personal history of nicotine dependence: Secondary | ICD-10-CM | POA: Diagnosis not present

## 2019-08-10 DIAGNOSIS — M1990 Unspecified osteoarthritis, unspecified site: Secondary | ICD-10-CM | POA: Insufficient documentation

## 2019-08-10 DIAGNOSIS — G629 Polyneuropathy, unspecified: Secondary | ICD-10-CM | POA: Diagnosis not present

## 2019-08-10 DIAGNOSIS — Z923 Personal history of irradiation: Secondary | ICD-10-CM | POA: Diagnosis not present

## 2019-08-10 LAB — COMPREHENSIVE METABOLIC PANEL
ALT: 11 U/L (ref 0–44)
AST: 23 U/L (ref 15–41)
Albumin: 3.8 g/dL (ref 3.5–5.0)
Alkaline Phosphatase: 57 U/L (ref 38–126)
Anion gap: 14 (ref 5–15)
BUN: 16 mg/dL (ref 8–23)
CO2: 26 mmol/L (ref 22–32)
Calcium: 9.3 mg/dL (ref 8.9–10.3)
Chloride: 95 mmol/L — ABNORMAL LOW (ref 98–111)
Creatinine, Ser: 1.11 mg/dL — ABNORMAL HIGH (ref 0.44–1.00)
GFR calc Af Amer: 53 mL/min — ABNORMAL LOW (ref >60)
GFR calc non Af Amer: 46 mL/min — ABNORMAL LOW (ref >60)
Glucose, Bld: 124 mg/dL — ABNORMAL HIGH (ref 70–99)
Potassium: 3.2 mmol/L — ABNORMAL LOW (ref 3.5–5.1)
Sodium: 135 mmol/L (ref 135–145)
Total Bilirubin: 1 mg/dL (ref 0.3–1.2)
Total Protein: 8.6 g/dL — ABNORMAL HIGH (ref 6.5–8.1)

## 2019-08-10 LAB — CBC WITH DIFFERENTIAL/PLATELET
Abs Immature Granulocytes: 0.18 10*3/uL — ABNORMAL HIGH (ref 0.00–0.07)
Basophils Absolute: 0 10*3/uL (ref 0.0–0.1)
Basophils Relative: 0 %
Eosinophils Absolute: 0 10*3/uL (ref 0.0–0.5)
Eosinophils Relative: 0 %
HCT: 35.2 % — ABNORMAL LOW (ref 36.0–46.0)
Hemoglobin: 10.2 g/dL — ABNORMAL LOW (ref 12.0–15.0)
Immature Granulocytes: 3 %
Lymphocytes Relative: 35 %
Lymphs Abs: 2.5 10*3/uL (ref 0.7–4.0)
MCH: 29.3 pg (ref 26.0–34.0)
MCHC: 29 g/dL — ABNORMAL LOW (ref 30.0–36.0)
MCV: 101.1 fL — ABNORMAL HIGH (ref 80.0–100.0)
Monocytes Absolute: 3 10*3/uL — ABNORMAL HIGH (ref 0.1–1.0)
Monocytes Relative: 41 %
Neutro Abs: 1.5 10*3/uL — ABNORMAL LOW (ref 1.7–7.7)
Neutrophils Relative %: 21 %
Platelets: 151 10*3/uL (ref 150–400)
RBC: 3.48 MIL/uL — ABNORMAL LOW (ref 3.87–5.11)
RDW: 16.6 % — ABNORMAL HIGH (ref 11.5–15.5)
WBC: 7.1 10*3/uL (ref 4.0–10.5)
nRBC: 1 % — ABNORMAL HIGH (ref 0.0–0.2)

## 2019-08-10 LAB — IRON AND TIBC
Iron: 29 ug/dL (ref 28–170)
Saturation Ratios: 8 % — ABNORMAL LOW (ref 10.4–31.8)
TIBC: 364 ug/dL (ref 250–450)
UIBC: 335 ug/dL

## 2019-08-10 LAB — FERRITIN: Ferritin: 191 ng/mL (ref 11–307)

## 2019-08-10 LAB — VITAMIN B12: Vitamin B-12: 268 pg/mL (ref 180–914)

## 2019-08-10 NOTE — Progress Notes (Signed)
Lisa [name], Lisa [name] [id number]   CLINIC:  Medical Oncology/Hematology  PCP:  Celene Squibb, MD [address] [place] [address] [phone]   REASON FOR VISIT: Follow-up for breast cancer, lung cancer, and pancytopenia  CURRENT THERAPY: Surveillance per NCCN guidelines  BRIEF ONCOLOGIC HISTORY:  Oncology History  Adenocarcinoma of left lung ([place])  06/23/2016 Imaging   CT chest- Stable exam. No new or progressive findings. Previously described tiny perifissural nodules at the base of the right major fissure are stable and likely represent subpleural lymph nodes.      CANCER STAGING: Cancer Staging Invasive ductal carcinoma of left breast [facility]) Staging form: Breast, AJCC 7th Edition - Pathologic stage from 07/03/1998: Stage IA (T1a, N0, cM0) - Signed by Baird Cancer, PA-C on 06/09/2016    INTERVAL HISTORY:  Lisa [name] 85 y.o. female returns for routine follow-up for history of breast cancer and lung cancer.  She is also being followed for pancytopenia.  She reports she has been doing well since her last visit.  She reports she occasionally gets short of breath on exertion.  She denies any bright red bleeding per rectum or melena.  She denies any easy bruising. Denies any nausea, vomiting, or diarrhea. Denies any new pains. Had not noticed any recent bleeding such as epistaxis, hematuria or hematochezia. Denies recent chest pain on exertion, shortness of breath on minimal exertion, pre-syncopal episodes, or palpitations. Denies any numbness or tingling in hands or feet. Denies any recent fevers, infections, or recent hospitalizations. Patient reports appetite at 100% and energy level at 75%.  She is eating well maintaining her weight at this time.    REVIEW OF SYSTEMS:  Review of Systems  Respiratory: Positive for shortness of breath (With exertion).   Neurological: Positive for numbness.  All other systems reviewed and are  negative.    PAST MEDICAL/SURGICAL HISTORY:  Past Medical History:  Diagnosis Date  . Adenocarcinoma of left lung ([place]) 2006  . Arthritis   . Asthma   . Cancer of breast, female ([place])   . Cancer of lung ([place])   . Hypertension   . Hypothyroidism   . Invasive ductal carcinoma of left breast ([place]) 1999  . Neutropenia ([place]) 06/09/2016  . Personal history of radiation therapy    Past Surgical History:  Procedure Laterality Date  . BREAST BIOPSY     left axillary node dissection  . BREAST LUMPECTOMY Left   . COLONOSCOPY  03/08/2003   KPT:WSFKCLEXNT rectal polyps destroyed with the tip of the snare/Polyps at hepatic flexure, splenic flexure at 35 cm/Left-sided diverticula: unable to retrieve path  . COLONOSCOPY  09/12/2008   ZGY:FVCBSW rectum and distal sigmoid diminutive polyps/scattered left sided diverticulum. hyperplastic  . COLONOSCOPY N/A 03/06/2013   HQP:RFFMBWG polyp-removed as described above; colonic diverticulosis. hyperplastic polyps. next TCS 03/2018  . FOOT SURGERY    . LUNG REMOVAL, PARTIAL     upper lobe     SOCIAL HISTORY:  Social History   Socioeconomic History  . Marital status: Widowed    Spouse name: Not on file  . Number of children: Not on file  . Years of education: Not on file  . Highest education level: Not on file  Occupational History  . Not on file  Social Needs  . Financial resource strain: Not on file  . Food insecurity    Worry: Not on file    Inability: Not on file  . Transportation  needs    Medical: Not on file    Non-medical: Not on file  Tobacco Use  . Smoking status: Former Smoker    Packs/day: 0.75    Years: 35.00    Pack years: 26.25    Quit date: 1993    Years since quitting: 27.7  . Smokeless tobacco: Never Used  . Tobacco comment: smoke-free X 30 yeras  Substance and Sexual Activity  . Alcohol use: No  . Drug use: No  . Sexual activity: Not on file  Lifestyle  . Physical activity    Days per week: Not on file     Minutes per session: Not on file  . Stress: Not on file  Relationships  . Social Herbalist on phone: Not on file    Gets together: Not on file    Attends religious service: Not on file    Active member of club or organization: Not on file    Attends meetings of clubs or organizations: Not on file    Relationship status: Not on file  . Intimate partner violence    Fear of current or ex partner: Not on file    Emotionally abused: Not on file    Physically abused: Not on file    Forced sexual activity: Not on file  Other Topics Concern  . Not on file  Social History Narrative  . Not on file    FAMILY HISTORY:  Family History  Problem Relation Age of Onset  . Cancer Mother   . Cancer Sister   . Colon cancer Neg Hx     CURRENT MEDICATIONS:  Outpatient Encounter Medications as of 08/10/2019  Medication Sig  . ferrous sulfate 325 (65 FE) MG tablet Take 325 mg by mouth daily with breakfast.  . gabapentin (NEURONTIN) 600 MG tablet   . levothyroxine (SYNTHROID, LEVOTHROID) 88 MCG tablet Take 88 mcg by mouth daily.   . meloxicam (MOBIC) 7.5 MG tablet   . metoprolol succinate (TOPROL-XL) 50 MG 24 hr tablet Take 50 mg by mouth daily. Take with or immediately following a meal.  . Multiple Vitamin (MULTIVITAMIN WITH MINERALS) TABS tablet Take 1 tablet by mouth daily.  Marland Kitchen telmisartan-hydrochlorothiazide (MICARDIS HCT) 80-12.5 MG per tablet Take 1 tablet by mouth daily.  . TRELEGY ELLIPTA 100-62.5-25 MCG/INH AEPB   . VITAMIN D, ERGOCALCIFEROL, PO Take 1 capsule by mouth daily.  Marland Kitchen albuterol (PROVENTIL,VENTOLIN) 90 MCG/ACT inhaler Inhale 2 puffs into the lungs every 6 (six) hours as needed for wheezing.  . clobetasol ointment (TEMOVATE) 0.05 %   . [DISCONTINUED] PROVENTIL HFA 108 (90 Base) MCG/ACT inhaler    No facility-administered encounter medications on file as of 08/10/2019.     ALLERGIES:  No Known Allergies   PHYSICAL EXAM:  ECOG Performance status: 1  Vitals:    08/10/19 1103  BP: (!) 169/83  Pulse: 86  Resp: 20  Temp: (!) 97.5 F (36.4 C)  SpO2: (!) 84%   Filed Weights   08/10/19 1103  Weight: 219 lb 4.8 oz (99.5 kg)   Oxygen saturation was rechecked in the room and she was 92% on room air.   Physical Exam Constitutional:      Appearance: Normal appearance. She is normal weight.  Cardiovascular:     Rate and Rhythm: Normal rate and regular rhythm.     Heart sounds: Normal heart sounds.  Pulmonary:     Effort: Pulmonary effort is normal.     Breath sounds: Normal breath sounds.  Abdominal:     General: Bowel sounds are normal.     Palpations: Abdomen is soft.  Musculoskeletal: Normal range of motion.  Skin:    General: Skin is warm.  Neurological:     Mental Status: She is alert and oriented to person, place, and time. Mental status is at baseline.  Psychiatric:        Mood and Affect: Mood normal.        Behavior: Behavior normal.        Thought Content: Thought content normal.        Judgment: Judgment normal.      LABORATORY DATA:  I have reviewed the labs as listed.  CBC    Component Value Date/Time   WBC 7.1 08/10/2019 1036   RBC 3.48 (L) 08/10/2019 1036   HGB 10.2 (L) 08/10/2019 1036   HCT 35.2 (L) 08/10/2019 1036   PLT 151 08/10/2019 1036   MCV 101.1 (H) 08/10/2019 1036   MCH 29.3 08/10/2019 1036   MCHC 29.0 (L) 08/10/2019 1036   RDW 16.6 (H) 08/10/2019 1036   LYMPHSABS 2.5 08/10/2019 1036   MONOABS 3.0 (H) 08/10/2019 1036   EOSABS 0.0 08/10/2019 1036   BASOSABS 0.0 08/10/2019 1036   CMP Latest Ref Rng & Units 08/10/2019 06/15/2019 04/26/2019  Glucose 70 - 99 mg/dL 124(H) 99 91  BUN 8 - 23 mg/dL 16 22 18   Creatinine 0.44 - 1.00 mg/dL 1.11(H) 1.11(H) 1.06(H)  Sodium 135 - 145 mmol/L 135 136 140  Potassium 3.5 - 5.1 mmol/L 3.2(L) 4.3 4.2  Chloride 98 - 111 mmol/L 95(L) 102 103  CO2 22 - 32 mmol/L 26 28 26   Calcium 8.9 - 10.3 mg/dL 9.3 9.5 9.3  Total Protein 6.5 - 8.1 g/dL 8.6(H) 8.6(H) 8.7(H)  Total  Bilirubin 0.3 - 1.2 mg/dL 1.0 0.6 0.6  Alkaline Phos 38 - 126 U/L 57 61 65  AST 15 - 41 U/L 23 16 15   ALT 0 - 44 U/L 11 11 10        I personally performed a face-to-face visit.  All questions were answered to patient's stated satisfaction. Encouraged patient to call with any new concerns or questions before his next visit to the cancer center and we can certain see him sooner, if needed.     ASSESSMENT & PLAN:   Invasive ductal carcinoma of left breast ([place]) 1.  Pancytopenia: -Patient referred to [place] by Dr. [place] Crane for work-up of pancytopenia.  CBC on 03/01/2019 showed WBC of 3.3, hemoglobin 9.7, MCV of 94, platelets of 147 with 22% neutrophils and ANC of 700.  B12 was normal. -Her labs on 03/13/2019 showed increased methylmalonic acid with a normal B12 level consistent with vitamin B12 deficiency.  We gave her 1 shot of B12 injection and started her on B12 tablets daily. -Hemoglobin remained stable, neutrophil count is actually improved to 1900.  She has no active signs of infection.  We will continue to monitor blood counts. -If there is any deterioration of her ANC, we will consider bone marrow aspiration and biopsy.  Differential diagnosis including MDS. -Labs done on 08/10/2019 showed her [place] at 1500.  Which is improved from 1200 on her last visit.  Hemoglobin is still 10.2, creatinine 1.11, platelets 151 we will check ferritin and iron panel on her next visit. - She will follow-up in 2 months with repeat labs.  2.  Peripheral neuropathy: -She has some numbness in the fingertips and toes.  She was taking gabapentin 600 mg 3 times  a day. -For the past 2 weeks, she reported increased pain in her hands.  Pains are described as achy in nature.  Aleve helps somewhat. -I recommended her to increase her gabapentin to 900 mg at bedtime till her pain resolves.  3.  Stage Ia left breast cancer: -She was diagnosed September 1999.  Treated with lumpectomy and XRT.  ER/PR was positive. -She received  tamoxifen for 5 years. -Mammogram on 10/24/2018 was B RADS category 1. -We will set up her next mammogram for the end of the year.  4.  Stage I adenocarcinoma of the left upper lobe of the lung: -He underwent left upper lobectomy on 06/01/2005. -Last CT of the chest with contrast on 06/09/2018 showed stable exam with no new findings.      Orders placed this encounter:  Orders Placed This Encounter  Procedures  . MM 3D SCREEN BREAST BILATERAL  . Ferritin  . Iron and TIBC  . Lactate dehydrogenase  . CBC with Differential/Platelet  . Comprehensive metabolic panel  . Ferritin  . Iron and TIBC  . Vitamin B12  . VITAMIN D 25 Hydroxy (Vit-D Deficiency, Fractures)  . Folate      Francene Finders, FNP-C [place] [phone]

## 2019-08-10 NOTE — Patient Instructions (Signed)
[place] at [facility] Discharge Instructions  Follow up in 2 months with labs    Thank you for choosing [place] at [facility] to provide your oncology and hematology care.  To afford each patient quality time with our provider, please arrive at least 15 minutes before your scheduled appointment time.   If you have a lab appointment with the [place] please come in thru the Main Entrance and check in at the main information desk.  You need to re-schedule your appointment should you arrive 10 or more minutes late.  We strive to give you quality time with our providers, and arriving late affects you and other patients whose appointments are after yours.  Also, if you no show three or more times for appointments you may be dismissed from the clinic at the providers discretion.     Again, thank you for choosing [facility].  Our hope is that these requests will decrease the amount of time that you wait before being seen by our physicians.       _____________________________________________________________  Should you have questions after your visit to [facility], please contact our office at [phone] between the hours of 8:00 a.m. and 4:30 p.m.  Voicemails left after 4:00 p.m. will not be returned until the following business day.  For prescription refill requests, have your pharmacy contact our office and allow 72 hours.    Due to Covid, you will need to wear a mask upon entering the hospital. If you do not have a mask, a mask will be given to you at the Main Entrance upon arrival. For doctor visits, patients may have 1 support person with them. For treatment visits, patients can not have anyone with them due to social distancing guidelines and our immunocompromised population.

## 2019-08-10 NOTE — Assessment & Plan Note (Addendum)
1.  Pancytopenia: -Patient referred to [place] by Dr. [place] Crane for work-up of pancytopenia.  CBC on 03/01/2019 showed WBC of 3.3, hemoglobin 9.7, MCV of 94, platelets of 147 with 22% neutrophils and ANC of 700.  B12 was normal. -Her labs on 03/13/2019 showed increased methylmalonic acid with a normal B12 level consistent with vitamin B12 deficiency.  We gave her 1 shot of B12 injection and started her on B12 tablets daily. -Hemoglobin remained stable, neutrophil count is actually improved to 1900.  She has no active signs of infection.  We will continue to monitor blood counts. -If there is any deterioration of her ANC, we will consider bone marrow aspiration and biopsy.  Differential diagnosis including MDS. -Labs done on 08/10/2019 showed her [place] at 1500.  Which is improved from 1200 on her last visit.  Hemoglobin is still 10.2, creatinine 1.11, platelets 151 we will check ferritin and iron panel on her next visit. - She will follow-up in 2 months with repeat labs.  2.  Peripheral neuropathy: -She has some numbness in the fingertips and toes.  She was taking gabapentin 600 mg 3 times a day. -For the past 2 weeks, she reported increased pain in her hands.  Pains are described as achy in nature.  Aleve helps somewhat. -I recommended her to increase her gabapentin to 900 mg at bedtime till her pain resolves.  3.  Stage Ia left breast cancer: -She was diagnosed September 1999.  Treated with lumpectomy and XRT.  ER/PR was positive. -She received tamoxifen for 5 years. -Mammogram on 10/24/2018 was B RADS category 1. -We will set up her next mammogram for the end of the year.  4.  Stage I adenocarcinoma of the left upper lobe of the lung: -He underwent left upper lobectomy on 06/01/2005. -Last CT of the chest with contrast on 06/09/2018 showed stable exam with no new findings.

## 2019-08-16 LAB — METHYLMALONIC ACID, SERUM: Methylmalonic Acid, Quantitative: 415 nmol/L — ABNORMAL HIGH (ref 0–378)

## 2019-09-27 ENCOUNTER — Other Ambulatory Visit: Payer: Self-pay

## 2019-10-05 ENCOUNTER — Other Ambulatory Visit: Payer: Self-pay

## 2019-10-05 ENCOUNTER — Inpatient Hospital Stay (HOSPITAL_COMMUNITY): Payer: Medicare Other | Attending: Hematology

## 2019-10-05 DIAGNOSIS — C50912 Malignant neoplasm of unspecified site of left female breast: Secondary | ICD-10-CM | POA: Diagnosis not present

## 2019-10-05 DIAGNOSIS — Z809 Family history of malignant neoplasm, unspecified: Secondary | ICD-10-CM | POA: Insufficient documentation

## 2019-10-05 DIAGNOSIS — R200 Anesthesia of skin: Secondary | ICD-10-CM | POA: Diagnosis not present

## 2019-10-05 DIAGNOSIS — Z87891 Personal history of nicotine dependence: Secondary | ICD-10-CM | POA: Insufficient documentation

## 2019-10-05 DIAGNOSIS — Z79899 Other long term (current) drug therapy: Secondary | ICD-10-CM | POA: Insufficient documentation

## 2019-10-05 DIAGNOSIS — D61818 Other pancytopenia: Secondary | ICD-10-CM | POA: Insufficient documentation

## 2019-10-05 DIAGNOSIS — E669 Obesity, unspecified: Secondary | ICD-10-CM | POA: Diagnosis not present

## 2019-10-05 DIAGNOSIS — Z170 Estrogen receptor positive status [ER+]: Secondary | ICD-10-CM | POA: Insufficient documentation

## 2019-10-05 DIAGNOSIS — Z8719 Personal history of other diseases of the digestive system: Secondary | ICD-10-CM | POA: Insufficient documentation

## 2019-10-05 DIAGNOSIS — Z86010 Personal history of colonic polyps: Secondary | ICD-10-CM | POA: Diagnosis not present

## 2019-10-05 DIAGNOSIS — C3412 Malignant neoplasm of upper lobe, left bronchus or lung: Secondary | ICD-10-CM | POA: Insufficient documentation

## 2019-10-05 DIAGNOSIS — G629 Polyneuropathy, unspecified: Secondary | ICD-10-CM | POA: Diagnosis not present

## 2019-10-05 LAB — COMPREHENSIVE METABOLIC PANEL
ALT: 11 U/L (ref 0–44)
AST: 18 U/L (ref 15–41)
Albumin: 3.8 g/dL (ref 3.5–5.0)
Alkaline Phosphatase: 50 U/L (ref 38–126)
Anion gap: 10 (ref 5–15)
BUN: 17 mg/dL (ref 8–23)
CO2: 25 mmol/L (ref 22–32)
Calcium: 9.2 mg/dL (ref 8.9–10.3)
Chloride: 99 mmol/L (ref 98–111)
Creatinine, Ser: 1.16 mg/dL — ABNORMAL HIGH (ref 0.44–1.00)
GFR calc Af Amer: 50 mL/min — ABNORMAL LOW (ref >60)
GFR calc non Af Amer: 44 mL/min — ABNORMAL LOW (ref >60)
Glucose, Bld: 94 mg/dL (ref 70–99)
Potassium: 4.4 mmol/L (ref 3.5–5.1)
Sodium: 134 mmol/L — ABNORMAL LOW (ref 135–145)
Total Bilirubin: 0.7 mg/dL (ref 0.3–1.2)
Total Protein: 8.2 g/dL — ABNORMAL HIGH (ref 6.5–8.1)

## 2019-10-05 LAB — CBC WITH DIFFERENTIAL/PLATELET
Abs Immature Granulocytes: 0.09 10*3/uL — ABNORMAL HIGH (ref 0.00–0.07)
Basophils Absolute: 0 10*3/uL (ref 0.0–0.1)
Basophils Relative: 0 %
Eosinophils Absolute: 0 10*3/uL (ref 0.0–0.5)
Eosinophils Relative: 0 %
HCT: 35.6 % — ABNORMAL LOW (ref 36.0–46.0)
Hemoglobin: 10.3 g/dL — ABNORMAL LOW (ref 12.0–15.0)
Immature Granulocytes: 1 %
Lymphocytes Relative: 30 %
Lymphs Abs: 1.9 10*3/uL (ref 0.7–4.0)
MCH: 29.4 pg (ref 26.0–34.0)
MCHC: 28.9 g/dL — ABNORMAL LOW (ref 30.0–36.0)
MCV: 101.7 fL — ABNORMAL HIGH (ref 80.0–100.0)
Monocytes Absolute: 1.8 10*3/uL — ABNORMAL HIGH (ref 0.1–1.0)
Monocytes Relative: 28 %
Neutro Abs: 2.5 10*3/uL (ref 1.7–7.7)
Neutrophils Relative %: 41 %
Platelets: 104 10*3/uL — ABNORMAL LOW (ref 150–400)
RBC: 3.5 MIL/uL — ABNORMAL LOW (ref 3.87–5.11)
RDW: 16.4 % — ABNORMAL HIGH (ref 11.5–15.5)
WBC: 6.2 10*3/uL (ref 4.0–10.5)
nRBC: 0.3 % — ABNORMAL HIGH (ref 0.0–0.2)

## 2019-10-05 LAB — LACTATE DEHYDROGENASE: LDH: 198 U/L — ABNORMAL HIGH (ref 98–192)

## 2019-10-05 LAB — IRON AND TIBC
Iron: 34 ug/dL (ref 28–170)
Saturation Ratios: 10 % — ABNORMAL LOW (ref 10.4–31.8)
TIBC: 333 ug/dL (ref 250–450)
UIBC: 299 ug/dL

## 2019-10-05 LAB — FERRITIN: Ferritin: 161 ng/mL (ref 11–307)

## 2019-10-05 LAB — VITAMIN D 25 HYDROXY (VIT D DEFICIENCY, FRACTURES): Vit D, 25-Hydroxy: 69.49 ng/mL (ref 30–100)

## 2019-10-05 LAB — VITAMIN B12: Vitamin B-12: 232 pg/mL (ref 180–914)

## 2019-10-05 LAB — FOLATE: Folate: 7.9 ng/mL (ref >5.9)

## 2019-10-12 ENCOUNTER — Other Ambulatory Visit: Payer: Self-pay

## 2019-10-12 ENCOUNTER — Inpatient Hospital Stay (HOSPITAL_BASED_OUTPATIENT_CLINIC_OR_DEPARTMENT_OTHER): Payer: Medicare Other | Admitting: Nurse Practitioner

## 2019-10-12 VITALS — BP 158/75 | HR 77 | Temp 98.2°F | Resp 18 | Wt 214.0 lb

## 2019-10-12 DIAGNOSIS — D61818 Other pancytopenia: Secondary | ICD-10-CM | POA: Diagnosis not present

## 2019-10-12 DIAGNOSIS — C3412 Malignant neoplasm of upper lobe, left bronchus or lung: Secondary | ICD-10-CM | POA: Diagnosis not present

## 2019-10-12 DIAGNOSIS — C50912 Malignant neoplasm of unspecified site of left female breast: Secondary | ICD-10-CM | POA: Diagnosis not present

## 2019-10-12 NOTE — Patient Instructions (Signed)
[place] at [facility] Discharge Instructions  Follow up in 2 months with labs    Thank you for choosing [place] at [facility] to provide your oncology and hematology care.  To afford each patient quality time with our provider, please arrive at least 15 minutes before your scheduled appointment time.   If you have a lab appointment with the [place] please come in thru the Main Entrance and check in at the main information desk.  You need to re-schedule your appointment should you arrive 10 or more minutes late.  We strive to give you quality time with our providers, and arriving late affects you and other patients whose appointments are after yours.  Also, if you no show three or more times for appointments you may be dismissed from the clinic at the providers discretion.     Again, thank you for choosing [facility].  Our hope is that these requests will decrease the amount of time that you wait before being seen by our physicians.       _____________________________________________________________  Should you have questions after your visit to [facility], please contact our office at [phone] between the hours of 8:00 a.m. and 4:30 p.m.  Voicemails left after 4:00 p.m. will not be returned until the following business day.  For prescription refill requests, have your pharmacy contact our office and allow 72 hours.    Due to Covid, you will need to wear a mask upon entering the hospital. If you do not have a mask, a mask will be given to you at the Main Entrance upon arrival. For doctor visits, patients may have 1 support person with them. For treatment visits, patients can not have anyone with them due to social distancing guidelines and our immunocompromised population.

## 2019-10-12 NOTE — Assessment & Plan Note (Addendum)
1.  Pancytopenia: -Patient referred to [place] by Dr. [place] Crane for work-up of pancytopenia.  CBC on 03/01/2019 showed WBC of 3.3, hemoglobin 9.7, MCV of 94, platelets of 147 with 22% neutrophils and ANC of 700.  B12 was normal. -Her labs on 03/13/2019 showed increased methylmalonic acid with a normal B12 level consistent with vitamin B12 deficiency.  We gave her 1 shot of B12 injection and started her on B12 tablets daily. -Hemoglobin remained stable, neutrophil count is actually improved to 1900.  She has no active signs of infection.  We will continue to monitor blood counts. -If there is any deterioration of her ANC, we will consider bone marrow aspiration and biopsy.  Differential diagnosis including MDS. -Labs done on 10/05/2019 showed her [place] at 2500.  Which is improved from 1500 on her last visit.  Hemoglobin is 10.3, creatinine 1.16, platelets have slightly decreased to 104 we will check ferritin and iron panel on her next visit. - She will follow-up in 2 months with repeat labs.  2.  Peripheral neuropathy: -She has some numbness in the fingertips and toes.  She was taking gabapentin 600 mg 3 times a day. -For the past 2 weeks, she reported increased pain in her hands.  Pains are described as achy in nature.  Aleve helps somewhat. -I recommended her to increase her gabapentin to 900 mg at bedtime till her pain resolves.  3.  Stage Ia left breast cancer: -She was diagnosed September 1999.  Treated with lumpectomy and XRT.  ER/PR was positive. -She received tamoxifen for 5 years. -Mammogram on 10/24/2018 was B RADS category 1. -Her next mammogram is set up for 10/30/2019  4.  Stage I adenocarcinoma of the left upper lobe of the lung: -He underwent left upper lobectomy on 06/01/2005. -Last CT of the chest with contrast on 06/09/2018 showed stable exam with no new findings.

## 2019-10-12 NOTE — Progress Notes (Signed)
Lisa [name], Lisa [name] [id number]   CLINIC:  Medical Oncology/Hematology  PCP:  Celene Squibb, MD [address] [place] [address] [phone]   REASON FOR VISIT: Follow-up for breast cancer, lung cancer, and pancytopenia  CURRENT THERAPY: Surveillance per NCCN guidelines  BRIEF ONCOLOGIC HISTORY:  Oncology History  Adenocarcinoma of left lung ([place])  06/23/2016 Imaging   CT chest- Stable exam. No new or progressive findings. Previously described tiny perifissural nodules at the base of the right major fissure are stable and likely represent subpleural lymph nodes.      CANCER STAGING: Cancer Staging Invasive ductal carcinoma of left breast [facility]) Staging form: Breast, AJCC 7th Edition - Pathologic stage from 07/03/1998: Stage IA (T1a, N0, cM0) - Signed by Baird Cancer, PA-C on 06/09/2016    INTERVAL HISTORY:  Lisa [name] 85 y.o. female returns for routine follow-up for breast cancer, lung cancer and pancytopenia.  Patient reports she has been doing well since her last visit.  She reports no fatigue at this time.  She denies any bright red bleeding per rectum or melena.  She denies any easy bruising or bleeding. Denies any nausea, vomiting, or diarrhea. Denies any new pains. Had not noticed any recent bleeding such as epistaxis, hematuria or hematochezia. Denies recent chest pain on exertion, shortness of breath on minimal exertion, pre-syncopal episodes, or palpitations. Denies any numbness or tingling in hands or feet. Denies any recent fevers, infections, or recent hospitalizations. Patient reports appetite at 100% and energy level at 75%.  She is eating well maintaining her weight at this time.    REVIEW OF SYSTEMS:  Review of Systems  Respiratory: Positive for shortness of breath.   All other systems reviewed and are negative.    PAST MEDICAL/SURGICAL HISTORY:  Past Medical History:  Diagnosis Date  . Adenocarcinoma of  left lung ([place]) 2006  . Arthritis   . Asthma   . Cancer of breast, female ([place])   . Cancer of lung ([place])   . Hypertension   . Hypothyroidism   . Invasive ductal carcinoma of left breast ([place]) 1999  . Neutropenia ([place]) 06/09/2016  . Personal history of radiation therapy    Past Surgical History:  Procedure Laterality Date  . BREAST BIOPSY     left axillary node dissection  . BREAST LUMPECTOMY Left   . COLONOSCOPY  03/08/2003   SWN:[id number] rectal polyps destroyed with the tip of the snare/Polyps at hepatic flexure, splenic flexure at 35 cm/Left-sided diverticula: unable to retrieve path  . COLONOSCOPY  09/12/2008   FGH:WEXHBZ rectum and distal sigmoid diminutive polyps/scattered left sided diverticulum. hyperplastic  . COLONOSCOPY N/A 03/06/2013   JIR:CVELFYB polyp-removed as described above; colonic diverticulosis. hyperplastic polyps. next TCS 03/2018  . FOOT SURGERY    . LUNG REMOVAL, PARTIAL     upper lobe     SOCIAL HISTORY:  Social History   Socioeconomic History  . Marital status: Widowed    Spouse name: Not on file  . Number of children: Not on file  . Years of education: Not on file  . Highest education level: Not on file  Occupational History  . Not on file  Tobacco Use  . Smoking status: Former Smoker    Packs/day: 0.75    Years: 35.00    Pack years: 26.25    Quit date: 1993    Years since quitting: 27.9  . Smokeless tobacco: Never Used  . Tobacco comment: smoke-free  X 30 yeras  Substance and Sexual Activity  . Alcohol use: No  . Drug use: No  . Sexual activity: Not on file  Other Topics Concern  . Not on file  Social History Narrative  . Not on file   Social Determinants of Health   Financial Resource Strain:   . Difficulty of Paying Living Expenses: Not on file  Food Insecurity:   . Worried About Charity fundraiser in the Last Year: Not on file  . Ran Out of Food in the Last Year: Not on file  Transportation Needs:   . Lack of Transportation  (Medical): Not on file  . Lack of Transportation (Non-Medical): Not on file  Physical Activity:   . Days of Exercise per Week: Not on file  . Minutes of Exercise per Session: Not on file  Stress:   . Feeling of Stress : Not on file  Social Connections:   . Frequency of Communication with Friends and Family: Not on file  . Frequency of Social Gatherings with Friends and Family: Not on file  . Attends Religious Services: Not on file  . Active Member of Clubs or Organizations: Not on file  . Attends Archivist Meetings: Not on file  . Marital Status: Not on file  Intimate Partner Violence:   . Fear of Current or Ex-Partner: Not on file  . Emotionally Abused: Not on file  . Physically Abused: Not on file  . Sexually Abused: Not on file    FAMILY HISTORY:  Family History  Problem Relation Age of Onset  . Cancer Mother   . Cancer Sister   . Colon cancer Neg Hx     CURRENT MEDICATIONS:  Outpatient Encounter Medications as of 10/12/2019  Medication Sig  . ferrous sulfate 325 (65 FE) MG tablet Take 325 mg by mouth daily with breakfast.  . gabapentin (NEURONTIN) 600 MG tablet Take 600 mg by mouth 3 (three) times daily.   Marland Kitchen levothyroxine (SYNTHROID, LEVOTHROID) 88 MCG tablet Take 88 mcg by mouth daily.   . meloxicam (MOBIC) 7.5 MG tablet Take 7.5 mg by mouth daily.   . metoprolol succinate (TOPROL-XL) 50 MG 24 hr tablet Take 50 mg by mouth daily. Take with or immediately following a meal.  . Multiple Vitamin (MULTIVITAMIN WITH MINERALS) TABS tablet Take 1 tablet by mouth daily.  Marland Kitchen telmisartan-hydrochlorothiazide (MICARDIS HCT) 80-12.5 MG per tablet Take 1 tablet by mouth daily.  . TRELEGY ELLIPTA 100-62.5-25 MCG/INH AEPB Inhale 1 puff into the lungs daily.   Marland Kitchen VITAMIN D, ERGOCALCIFEROL, PO Take 1 capsule by mouth daily.  Marland Kitchen albuterol (PROVENTIL,VENTOLIN) 90 MCG/ACT inhaler Inhale 2 puffs into the lungs every 6 (six) hours as needed for wheezing.  . clobetasol ointment  (TEMOVATE) 7.20 % Apply 1 application topically as needed.    No facility-administered encounter medications on file as of 10/12/2019.    ALLERGIES:  No Known Allergies   PHYSICAL EXAM:  ECOG Performance status: 1  Vitals:   10/12/19 1012  BP: (!) 158/75  Pulse: 77  Resp: 18  Temp: 98.2 F (36.8 C)  SpO2: 94%   Filed Weights   10/12/19 1012  Weight: 214 lb (97.1 kg)    Physical Exam Constitutional:      Appearance: She is obese.  Cardiovascular:     Rate and Rhythm: Normal rate and regular rhythm.     Heart sounds: Normal heart sounds.  Pulmonary:     Effort: Pulmonary effort is normal.  Breath sounds: Normal breath sounds.  Abdominal:     General: Bowel sounds are normal.     Palpations: Abdomen is soft.  Musculoskeletal:        General: Normal range of motion.  Skin:    General: Skin is warm.  Neurological:     Mental Status: She is alert and oriented to person, place, and time. Mental status is at baseline.  Psychiatric:        Mood and Affect: Mood normal.        Behavior: Behavior normal.        Thought Content: Thought content normal.        Judgment: Judgment normal.      LABORATORY DATA:  I have reviewed the labs as listed.  CBC    Component Value Date/Time   WBC 6.2 10/05/2019 1220   RBC 3.50 (L) 10/05/2019 1220   HGB 10.3 (L) 10/05/2019 1220   HCT 35.6 (L) 10/05/2019 1220   PLT 104 (L) 10/05/2019 1220   MCV 101.7 (H) 10/05/2019 1220   MCH 29.4 10/05/2019 1220   MCHC 28.9 (L) 10/05/2019 1220   RDW 16.4 (H) 10/05/2019 1220   LYMPHSABS 1.9 10/05/2019 1220   MONOABS 1.8 (H) 10/05/2019 1220   EOSABS 0.0 10/05/2019 1220   BASOSABS 0.0 10/05/2019 1220   CMP Latest Ref Rng & Units 10/05/2019 08/10/2019 06/15/2019  Glucose 70 - 99 mg/dL 94 124(H) 99  BUN 8 - 23 mg/dL 17 16 22   Creatinine 0.44 - 1.00 mg/dL 1.16(H) 1.11(H) 1.11(H)  Sodium 135 - 145 mmol/L 134(L) 135 136  Potassium 3.5 - 5.1 mmol/L 4.4 3.2(L) 4.3  Chloride 98 - 111 mmol/L 99  95(L) 102  CO2 22 - 32 mmol/L 25 26 28   Calcium 8.9 - 10.3 mg/dL 9.2 9.3 9.5  Total Protein 6.5 - 8.1 g/dL 8.2(H) 8.6(H) 8.6(H)  Total Bilirubin 0.3 - 1.2 mg/dL 0.7 1.0 0.6  Alkaline Phos 38 - 126 U/L 50 57 61  AST 15 - 41 U/L 18 23 16   ALT 0 - 44 U/L 11 11 11      I personally performed a face-to-face visit.  All questions were answered to patient's stated satisfaction. Encouraged patient to call with any new concerns or questions before his next visit to the cancer center and we can certain see him sooner, if needed.     ASSESSMENT & PLAN:   Invasive ductal carcinoma of left breast ([place]) 1.  Pancytopenia: -Patient referred to [place] by Dr. [place] Crane for work-up of pancytopenia.  CBC on 03/01/2019 showed WBC of 3.3, hemoglobin 9.7, MCV of 94, platelets of 147 with 22% neutrophils and ANC of 700.  B12 was normal. -Her labs on 03/13/2019 showed increased methylmalonic acid with a normal B12 level consistent with vitamin B12 deficiency.  We gave her 1 shot of B12 injection and started her on B12 tablets daily. -Hemoglobin remained stable, neutrophil count is actually improved to 1900.  She has no active signs of infection.  We will continue to monitor blood counts. -If there is any deterioration of her ANC, we will consider bone marrow aspiration and biopsy.  Differential diagnosis including MDS. -Labs done on 10/05/2019 showed her [place] at 2500.  Which is improved from 1500 on her last visit.  Hemoglobin is 10.3, creatinine 1.16, platelets have slightly decreased to 104 we will check ferritin and iron panel on her next visit. - She will follow-up in 2 months with repeat labs.  2.  Peripheral neuropathy: -She has  some numbness in the fingertips and toes.  She was taking gabapentin 600 mg 3 times a day. -For the past 2 weeks, she reported increased pain in her hands.  Pains are described as achy in nature.  Aleve helps somewhat. -I recommended her to increase her gabapentin to 900 mg at bedtime till her  pain resolves.  3.  Stage Ia left breast cancer: -She was diagnosed September 1999.  Treated with lumpectomy and XRT.  ER/PR was positive. -She received tamoxifen for 5 years. -Mammogram on 10/24/2018 was B RADS category 1. -Her next mammogram is set up for 10/30/2019  4.  Stage I adenocarcinoma of the left upper lobe of the lung: -He underwent left upper lobectomy on 06/01/2005. -Last CT of the chest with contrast on 06/09/2018 showed stable exam with no new findings.      Orders placed this encounter:  Orders Placed This Encounter  Procedures  . Lactate dehydrogenase  . CBC with Differential/Platelet  . Comprehensive metabolic panel  . Ferritin  . Iron and TIBC  . Vitamin B12  . Vitamin D 25 hydroxy  . Folate      Francene Finders, FNP-C [place] [phone]

## 2019-10-30 ENCOUNTER — Ambulatory Visit (HOSPITAL_COMMUNITY)
Admission: RE | Admit: 2019-10-30 | Discharge: 2019-10-30 | Disposition: A | Discharge status: Home / Self Care | Payer: Medicare Other | Source: Ambulatory Visit | Attending: Nurse Practitioner | Admitting: Nurse Practitioner

## 2019-10-30 ENCOUNTER — Other Ambulatory Visit: Payer: Self-pay

## 2019-10-30 DIAGNOSIS — Z1231 Encounter for screening mammogram for malignant neoplasm of breast: Secondary | ICD-10-CM | POA: Diagnosis present

## 2019-11-14 DIAGNOSIS — Z23 Encounter for immunization: Secondary | ICD-10-CM | POA: Diagnosis not present

## 2019-12-14 DIAGNOSIS — Z23 Encounter for immunization: Secondary | ICD-10-CM | POA: Diagnosis not present

## 2019-12-19 ENCOUNTER — Inpatient Hospital Stay (HOSPITAL_COMMUNITY): Payer: Medicare Other | Attending: Hematology

## 2019-12-19 ENCOUNTER — Other Ambulatory Visit: Payer: Self-pay

## 2019-12-19 ENCOUNTER — Inpatient Hospital Stay (HOSPITAL_BASED_OUTPATIENT_CLINIC_OR_DEPARTMENT_OTHER): Payer: Medicare Other | Admitting: Nurse Practitioner

## 2019-12-19 ENCOUNTER — Encounter (HOSPITAL_COMMUNITY): Payer: Self-pay | Admitting: Nurse Practitioner

## 2019-12-19 VITALS — BP 164/79 | HR 76 | Temp 97.1°F | Resp 20 | Wt 215.8 lb

## 2019-12-19 DIAGNOSIS — Z79899 Other long term (current) drug therapy: Secondary | ICD-10-CM | POA: Insufficient documentation

## 2019-12-19 DIAGNOSIS — R200 Anesthesia of skin: Secondary | ICD-10-CM | POA: Insufficient documentation

## 2019-12-19 DIAGNOSIS — D61818 Other pancytopenia: Secondary | ICD-10-CM

## 2019-12-19 DIAGNOSIS — C50912 Malignant neoplasm of unspecified site of left female breast: Secondary | ICD-10-CM | POA: Insufficient documentation

## 2019-12-19 DIAGNOSIS — C3412 Malignant neoplasm of upper lobe, left bronchus or lung: Secondary | ICD-10-CM | POA: Insufficient documentation

## 2019-12-19 DIAGNOSIS — Z8719 Personal history of other diseases of the digestive system: Secondary | ICD-10-CM | POA: Diagnosis not present

## 2019-12-19 DIAGNOSIS — Z809 Family history of malignant neoplasm, unspecified: Secondary | ICD-10-CM | POA: Diagnosis not present

## 2019-12-19 DIAGNOSIS — Z86010 Personal history of colonic polyps: Secondary | ICD-10-CM | POA: Diagnosis not present

## 2019-12-19 DIAGNOSIS — G629 Polyneuropathy, unspecified: Secondary | ICD-10-CM | POA: Diagnosis not present

## 2019-12-19 DIAGNOSIS — Z87891 Personal history of nicotine dependence: Secondary | ICD-10-CM | POA: Insufficient documentation

## 2019-12-19 DIAGNOSIS — R0602 Shortness of breath: Secondary | ICD-10-CM | POA: Insufficient documentation

## 2019-12-19 DIAGNOSIS — Z923 Personal history of irradiation: Secondary | ICD-10-CM | POA: Insufficient documentation

## 2019-12-19 DIAGNOSIS — Z170 Estrogen receptor positive status [ER+]: Secondary | ICD-10-CM | POA: Insufficient documentation

## 2019-12-19 LAB — CBC WITH DIFFERENTIAL/PLATELET
Abs Immature Granulocytes: 0.15 10*3/uL — ABNORMAL HIGH (ref 0.00–0.07)
Basophils Absolute: 0 10*3/uL (ref 0.0–0.1)
Basophils Relative: 1 %
Eosinophils Absolute: 0 10*3/uL (ref 0.0–0.5)
Eosinophils Relative: 0 %
HCT: 39.6 % (ref 36.0–46.0)
Hemoglobin: 11.8 g/dL — ABNORMAL LOW (ref 12.0–15.0)
Immature Granulocytes: 4 %
Lymphocytes Relative: 46 %
Lymphs Abs: 1.7 10*3/uL (ref 0.7–4.0)
MCH: 31.1 pg (ref 26.0–34.0)
MCHC: 29.8 g/dL — ABNORMAL LOW (ref 30.0–36.0)
MCV: 104.2 fL — ABNORMAL HIGH (ref 80.0–100.0)
Monocytes Absolute: 1.2 10*3/uL — ABNORMAL HIGH (ref 0.1–1.0)
Monocytes Relative: 30 %
Neutro Abs: 0.7 10*3/uL — ABNORMAL LOW (ref 1.7–7.7)
Neutrophils Relative %: 19 %
Platelets: 104 10*3/uL — ABNORMAL LOW (ref 150–400)
RBC: 3.8 MIL/uL — ABNORMAL LOW (ref 3.87–5.11)
RDW: 16.3 % — ABNORMAL HIGH (ref 11.5–15.5)
WBC: 3.8 10*3/uL — ABNORMAL LOW (ref 4.0–10.5)
nRBC: 0.5 % — ABNORMAL HIGH (ref 0.0–0.2)

## 2019-12-19 LAB — COMPREHENSIVE METABOLIC PANEL
ALT: 14 U/L (ref 0–44)
AST: 22 U/L (ref 15–41)
Albumin: 4 g/dL (ref 3.5–5.0)
Alkaline Phosphatase: 51 U/L (ref 38–126)
Anion gap: 9 (ref 5–15)
BUN: 22 mg/dL (ref 8–23)
CO2: 27 mmol/L (ref 22–32)
Calcium: 9.3 mg/dL (ref 8.9–10.3)
Chloride: 102 mmol/L (ref 98–111)
Creatinine, Ser: 1.23 mg/dL — ABNORMAL HIGH (ref 0.44–1.00)
GFR calc Af Amer: 47 mL/min — ABNORMAL LOW (ref >60)
GFR calc non Af Amer: 41 mL/min — ABNORMAL LOW (ref >60)
Glucose, Bld: 104 mg/dL — ABNORMAL HIGH (ref 70–99)
Potassium: 4.3 mmol/L (ref 3.5–5.1)
Sodium: 138 mmol/L (ref 135–145)
Total Bilirubin: 0.5 mg/dL (ref 0.3–1.2)
Total Protein: 8.5 g/dL — ABNORMAL HIGH (ref 6.5–8.1)

## 2019-12-19 LAB — IRON AND TIBC
Iron: 53 ug/dL (ref 28–170)
Saturation Ratios: 15 % (ref 10.4–31.8)
TIBC: 352 ug/dL (ref 250–450)
UIBC: 299 ug/dL

## 2019-12-19 LAB — VITAMIN B12: Vitamin B-12: 382 pg/mL (ref 180–914)

## 2019-12-19 LAB — FERRITIN: Ferritin: 188 ng/mL (ref 11–307)

## 2019-12-19 LAB — VITAMIN D 25 HYDROXY (VIT D DEFICIENCY, FRACTURES): Vit D, 25-Hydroxy: 90.94 ng/mL (ref 30–100)

## 2019-12-19 LAB — LACTATE DEHYDROGENASE: LDH: 224 U/L — ABNORMAL HIGH (ref 98–192)

## 2019-12-19 LAB — FOLATE: Folate: 16.8 ng/mL (ref >5.9)

## 2019-12-19 NOTE — Patient Instructions (Signed)
[place] at [facility] Discharge Instructions  Follow up in 2 months with labs    Thank you for choosing [place] at [facility] to provide your oncology and hematology care.  To afford each patient quality time with our provider, please arrive at least 15 minutes before your scheduled appointment time.   If you have a lab appointment with the [place] please come in thru the Main Entrance and check in at the main information desk.  You need to re-schedule your appointment should you arrive 10 or more minutes late.  We strive to give you quality time with our providers, and arriving late affects you and other patients whose appointments are after yours.  Also, if you no show three or more times for appointments you may be dismissed from the clinic at the providers discretion.     Again, thank you for choosing [facility].  Our hope is that these requests will decrease the amount of time that you wait before being seen by our physicians.       _____________________________________________________________  Should you have questions after your visit to [facility], please contact our office at [phone] between the hours of 8:00 a.m. and 4:30 p.m.  Voicemails left after 4:00 p.m. will not be returned until the following business day.  For prescription refill requests, have your pharmacy contact our office and allow 72 hours.    Due to Covid, you will need to wear a mask upon entering the hospital. If you do not have a mask, a mask will be given to you at the Main Entrance upon arrival. For doctor visits, patients may have 1 support person with them. For treatment visits, patients can not have anyone with them due to social distancing guidelines and our immunocompromised population.

## 2019-12-19 NOTE — Progress Notes (Signed)
Lisa [name], [place] [id number]   CLINIC:  Medical Oncology/Hematology  PCP:  Celene Squibb, MD [place] [id number] [phone]   REASON FOR VISIT: Follow-up for pancytopenia and breast cancer  CURRENT THERAPY: Observation  BRIEF ONCOLOGIC HISTORY:  Oncology History  Adenocarcinoma of left lung ([place])  06/23/2016 Imaging   CT chest- Stable exam. No new or progressive findings. Previously described tiny perifissural nodules at the base of the right major fissure are stable and likely represent subpleural lymph nodes.      CANCER STAGING: Cancer Staging Invasive ductal carcinoma of left breast [facility]) Staging form: Breast, AJCC 7th Edition - Pathologic stage from 07/03/1998: Stage IA (T1a, N0, cM0) - Signed by Baird Cancer, PA-C on 06/09/2016    INTERVAL HISTORY:  Lisa [name] 86 y.o. female returns for routine follow-up for pancytopenia and breast cancer.  Patient reports she is doing well since her last visit.  She denies any easy bruising or bleeding.  She denies any new lumps present. Denies any nausea, vomiting, or diarrhea. Denies any new pains. Had not noticed any recent bleeding such as epistaxis, hematuria or hematochezia. Denies recent chest pain on exertion, shortness of breath on minimal exertion, pre-syncopal episodes, or palpitations. Denies any numbness or tingling in hands or feet. Denies any recent fevers, infections, or recent hospitalizations. Patient reports appetite at 100% and energy level at 75%.  She is eating well maintain her weight at this time.     REVIEW OF SYSTEMS:  Review of Systems  Respiratory: Positive for shortness of breath.   Neurological: Positive for numbness.  All other systems reviewed and are negative.    PAST MEDICAL/SURGICAL HISTORY:  Past Medical History:  Diagnosis Date  . Adenocarcinoma of left lung ([place]) 2006  . Arthritis   . Asthma   . Cancer of breast, female  ([place])   . Cancer of lung ([place])   . Hypertension   . Hypothyroidism   . Invasive ductal carcinoma of left breast ([place]) 1999  . Neutropenia ([place]) 06/09/2016  . Personal history of radiation therapy    Past Surgical History:  Procedure Laterality Date  . BREAST BIOPSY     left axillary node dissection  . BREAST LUMPECTOMY Left   . COLONOSCOPY  03/08/2003   JSE:[id number] rectal polyps destroyed with the tip of the snare/Polyps at hepatic flexure, splenic flexure at 35 cm/Left-sided diverticula: unable to retrieve path  . COLONOSCOPY  09/12/2008   VPX:TGGYIR rectum and distal sigmoid diminutive polyps/scattered left sided diverticulum. hyperplastic  . COLONOSCOPY N/A 03/06/2013   SWN:IOEVOJJ polyp-removed as described above; colonic diverticulosis. hyperplastic polyps. next TCS 03/2018  . FOOT SURGERY    . LUNG REMOVAL, PARTIAL     upper lobe     SOCIAL HISTORY:  Social History   Socioeconomic History  . Marital status: Widowed    Spouse name: Not on file  . Number of children: Not on file  . Years of education: Not on file  . Highest education level: Not on file  Occupational History  . Not on file  Tobacco Use  . Smoking status: Former Smoker    Packs/day: 0.75    Years: 35.00    Pack years: 26.25    Quit date: 1993    Years since quitting: 28.1  . Smokeless tobacco: Never Used  . Tobacco comment: smoke-free X 30 yeras  Substance and Sexual Activity  . Alcohol use: No  .  Drug use: No  . Sexual activity: Not on file  Other Topics Concern  . Not on file  Social History Narrative  . Not on file   Social Determinants of Health   Financial Resource Strain:   . Difficulty of Paying Living Expenses: Not on file  Food Insecurity:   . Worried About Charity fundraiser in the Last Year: Not on file  . Ran Out of Food in the Last Year: Not on file  Transportation Needs:   . Lack of Transportation (Medical): Not on file  . Lack of Transportation (Non-Medical): Not on file    Physical Activity:   . Days of Exercise per Week: Not on file  . Minutes of Exercise per Session: Not on file  Stress:   . Feeling of Stress : Not on file  Social Connections:   . Frequency of Communication with Friends and Family: Not on file  . Frequency of Social Gatherings with Friends and Family: Not on file  . Attends Religious Services: Not on file  . Active Member of Clubs or Organizations: Not on file  . Attends Archivist Meetings: Not on file  . Marital Status: Not on file  Intimate Partner Violence:   . Fear of Current or Ex-Partner: Not on file  . Emotionally Abused: Not on file  . Physically Abused: Not on file  . Sexually Abused: Not on file    FAMILY HISTORY:  Family History  Problem Relation Age of Onset  . Cancer Mother   . Cancer Sister   . Colon cancer Neg Hx     CURRENT MEDICATIONS:  Outpatient Encounter Medications as of 12/19/2019  Medication Sig  . ferrous sulfate 325 (65 FE) MG tablet Take 325 mg by mouth daily with breakfast.  . gabapentin (NEURONTIN) 600 MG tablet Take 600 mg by mouth 3 (three) times daily.   Marland Kitchen levothyroxine (SYNTHROID, LEVOTHROID) 88 MCG tablet Take 88 mcg by mouth daily.   . meloxicam (MOBIC) 7.5 MG tablet Take 7.5 mg by mouth daily.   . metoprolol succinate (TOPROL-XL) 50 MG 24 hr tablet Take 50 mg by mouth daily. Take with or immediately following a meal.  . Multiple Vitamin (MULTIVITAMIN WITH MINERALS) TABS tablet Take 1 tablet by mouth daily.  Marland Kitchen telmisartan-hydrochlorothiazide (MICARDIS HCT) 80-12.5 MG per tablet Take 1 tablet by mouth daily.  . TRELEGY ELLIPTA 100-62.5-25 MCG/INH AEPB Inhale 1 puff into the lungs daily.   Marland Kitchen VITAMIN D, ERGOCALCIFEROL, PO Take 1 capsule by mouth daily.  Marland Kitchen albuterol (PROVENTIL,VENTOLIN) 90 MCG/ACT inhaler Inhale 2 puffs into the lungs every 6 (six) hours as needed for wheezing.  . clobetasol ointment (TEMOVATE) 5.40 % Apply 1 application topically as needed.    No  facility-administered encounter medications on file as of 12/19/2019.    ALLERGIES:  No Known Allergies   PHYSICAL EXAM:  ECOG Performance status: 1  Vitals:   12/19/19 1134  BP: (!) 164/79  Pulse: 76  Resp: 20  Temp: (!) 97.1 F (36.2 C)  SpO2: (!) 86%   Filed Weights   12/19/19 1134  Weight: 215 lb 12.8 oz (97.9 kg)    Physical Exam Constitutional:      Appearance: Normal appearance. She is normal weight.  Cardiovascular:     Rate and Rhythm: Normal rate and regular rhythm.     Heart sounds: Normal heart sounds.  Pulmonary:     Effort: Pulmonary effort is normal.     Breath sounds: Normal breath sounds.  Abdominal:     General: Bowel sounds are normal.     Palpations: Abdomen is soft.  Musculoskeletal:        General: Normal range of motion.  Skin:    General: Skin is warm.  Neurological:     Mental Status: She is alert and oriented to person, place, and time. Mental status is at baseline.  Psychiatric:        Mood and Affect: Mood normal.        Behavior: Behavior normal.        Thought Content: Thought content normal.        Judgment: Judgment normal.      LABORATORY DATA:  I have reviewed the labs as listed.  CBC    Component Value Date/Time   WBC 3.8 (L) 12/19/2019 1051   RBC 3.80 (L) 12/19/2019 1051   HGB 11.8 (L) 12/19/2019 1051   HCT 39.6 12/19/2019 1051   PLT 104 (L) 12/19/2019 1051   MCV 104.2 (H) 12/19/2019 1051   MCH 31.1 12/19/2019 1051   MCHC 29.8 (L) 12/19/2019 1051   RDW 16.3 (H) 12/19/2019 1051   LYMPHSABS 1.7 12/19/2019 1051   MONOABS 1.2 (H) 12/19/2019 1051   EOSABS 0.0 12/19/2019 1051   BASOSABS 0.0 12/19/2019 1051   CMP Latest Ref Rng & Units 12/19/2019 10/05/2019 08/10/2019  Glucose 70 - 99 mg/dL 104(H) 94 124(H)  BUN 8 - 23 mg/dL _0 Creatinine 0.44 - 1.00 mg/dL 1.23(H) 1.16(H) 1.11(H)  Sodium 135 - 145 mmol/L 138 134(L) 135  Potassium 3.5 - 5.1 mmol/L 4.3 4.4 3.2(L)  Chloride 98 - 111 mmol/L 102 99 95(L)  CO2 22 -  32 mmol/L _1 Calcium 8.9 - 10.3 mg/dL 9.3 9.2 9.3  Total Protein 6.5 - 8.1 g/dL 8.5(H) 8.2(H) 8.6(H)  Total Bilirubin 0.3 - 1.2 mg/dL 0.5 0.7 1.0  Alkaline Phos 38 - 126 U/L 51 50 57  AST 15 - 41 U/L _2 ALT 0 - 44 U/L _3 I personally performed a face-to-face visit.  All questions were answered to patient's stated satisfaction. Encouraged patient to call with any new concerns or questions before his next visit to the cancer center and we can certain see him sooner, if needed.     ASSESSMENT & PLAN:   Invasive ductal carcinoma of left breast ([place]) 1.  Pancytopenia: -Patient referred to [place] by Dr. [place] Crane for work-up of pancytopenia.  CBC on 03/01/2019 showed WBC of 3.3, hemoglobin 9.7, MCV of 94, platelets of 147 with 22% neutrophils and ANC of 700.  B12 was normal. -Her labs on 03/13/2019 showed increased methylmalonic acid with a normal B12 level consistent with vitamin B12 deficiency.  We gave her 1 shot of B12 injection and started her on B12 tablets daily. -Hemoglobin remained stable, neutrophil count is actually improved to 1900.  She has no active signs of infection.  We will continue to monitor blood counts. -If there is any deterioration of her ANC, we will consider bone marrow aspiration and biopsy.  Differential diagnosis including MDS. -Labs done on 10/05/2019 showed her [place] at 2500.  Which is improved from 1500 on her last visit.  Hemoglobin is 10.3, creatinine 1.16, platelets have slightly decreased to 104 we will check ferritin and iron panel on her next visit. -Labs done on 12/19/2019 showed her [place] at 700.  This is decreased from 2500.  Hemoglobin is improved to 11.8.,  Creatinine  1.23, platelets remain the same at 104. -We will check her [place] again in 2 months.  And refer to attending physician for recommendations on bone marrow biopsy or observation. - She will follow-up in 2 months with repeat labs.  2.  Peripheral neuropathy: -She has some numbness in the  fingertips and toes.  She was taking gabapentin 600 mg 3 times a day. -For the past 2 weeks, she reported increased pain in her hands.  Pains are described as achy in nature.  Aleve helps somewhat. -I recommended her to increase her gabapentin to 900 mg at bedtime till her pain resolves.  3.  Stage Ia left breast cancer: -She was diagnosed September 1999.  Treated with lumpectomy and XRT.  ER/PR was positive. -She received tamoxifen for 5 years. -Mammogram on 10/30/2019 was B RADS category 2 benign. -She will be scheduled for her next mammogram in 1 year.  4.  Stage I adenocarcinoma of the left upper lobe of the lung: -He underwent left upper lobectomy on 06/01/2005. -Last CT of the chest with contrast on 06/09/2018 showed stable exam with no new findings.      Orders placed this encounter:  Orders Placed This Encounter  Procedures  . Lactate dehydrogenase  . CBC with Differential/Platelet  . Comprehensive metabolic panel  . Ferritin  . Iron and TIBC  . Vitamin B12  . VITAMIN D 25 Hydroxy (Vit-D Deficiency, Fractures)      Francene Finders, FNP-C [place] [phone]

## 2019-12-19 NOTE — Assessment & Plan Note (Addendum)
1.  Pancytopenia: -Patient referred to [place] by Dr. Nevada Crane for work-up of pancytopenia.  CBC on 03/01/2019 showed WBC of 3.3, hemoglobin 9.7, MCV of 94, platelets of 147 with 22% neutrophils and ANC of 700.  B12 was normal. -Her labs on 03/13/2019 showed increased methylmalonic acid with a normal B12 level consistent with vitamin B12 deficiency.  We gave her 1 shot of B12 injection and started her on B12 tablets daily. -Hemoglobin remained stable, neutrophil count is actually improved to 1900.  She has no active signs of infection.  We will continue to monitor blood counts. -If there is any deterioration of her ANC, we will consider bone marrow aspiration and biopsy.  Differential diagnosis including MDS. -Labs done on 10/05/2019 showed her [place] at 2500.  Which is improved from 1500 on her last visit.  Hemoglobin is 10.3, creatinine 1.16, platelets have slightly decreased to 104 we will check ferritin and iron panel on her next visit. -Labs done on 12/19/2019 showed her [place] at 700.  This is decreased from 2500.  Hemoglobin is improved to 11.8.,  Creatinine 1.23, platelets remain the same at 104. -We will check her [place] again in 2 months.  And refer to attending physician for recommendations on bone marrow biopsy or observation. - She will follow-up in 2 months with repeat labs.  2.  Peripheral neuropathy: -She has some numbness in the fingertips and toes.  She was taking gabapentin 600 mg 3 times a day. -For the past 2 weeks, she reported increased pain in her hands.  Pains are described as achy in nature.  Aleve helps somewhat. -I recommended her to increase her gabapentin to 900 mg at bedtime till her pain resolves.  3.  Stage Ia left breast cancer: -She was diagnosed September 1999.  Treated with lumpectomy and XRT.  ER/PR was positive. -She received tamoxifen for 5 years. -Mammogram on 10/30/2019 was B RADS category 2 benign. -She will be scheduled for her next mammogram in 1 year.  4.  Stage I  adenocarcinoma of the left upper lobe of the lung: -He underwent left upper lobectomy on 06/01/2005. -Last CT of the chest with contrast on 06/09/2018 showed stable exam with no new findings.

## 2019-12-23 ENCOUNTER — Encounter (HOSPITAL_COMMUNITY): Payer: Self-pay | Admitting: Nurse Practitioner

## 2019-12-27 ENCOUNTER — Encounter (HOSPITAL_COMMUNITY): Payer: Self-pay

## 2019-12-28 ENCOUNTER — Encounter (HOSPITAL_COMMUNITY): Payer: Self-pay | Admitting: Nurse Practitioner

## 2020-01-09 DIAGNOSIS — E782 Mixed hyperlipidemia: Secondary | ICD-10-CM | POA: Diagnosis not present

## 2020-01-09 DIAGNOSIS — D509 Iron deficiency anemia, unspecified: Secondary | ICD-10-CM | POA: Diagnosis not present

## 2020-01-09 DIAGNOSIS — E559 Vitamin D deficiency, unspecified: Secondary | ICD-10-CM | POA: Diagnosis not present

## 2020-01-09 DIAGNOSIS — I10 Essential (primary) hypertension: Secondary | ICD-10-CM | POA: Diagnosis not present

## 2020-01-09 DIAGNOSIS — E039 Hypothyroidism, unspecified: Secondary | ICD-10-CM | POA: Diagnosis not present

## 2020-01-09 DIAGNOSIS — D649 Anemia, unspecified: Secondary | ICD-10-CM | POA: Diagnosis not present

## 2020-01-09 DIAGNOSIS — D500 Iron deficiency anemia secondary to blood loss (chronic): Secondary | ICD-10-CM | POA: Diagnosis not present

## 2020-01-15 DIAGNOSIS — D72819 Decreased white blood cell count, unspecified: Secondary | ICD-10-CM | POA: Diagnosis not present

## 2020-01-15 DIAGNOSIS — D509 Iron deficiency anemia, unspecified: Secondary | ICD-10-CM | POA: Diagnosis not present

## 2020-01-15 DIAGNOSIS — M2550 Pain in unspecified joint: Secondary | ICD-10-CM | POA: Diagnosis not present

## 2020-01-15 DIAGNOSIS — E559 Vitamin D deficiency, unspecified: Secondary | ICD-10-CM | POA: Diagnosis not present

## 2020-01-15 DIAGNOSIS — D61818 Other pancytopenia: Secondary | ICD-10-CM | POA: Diagnosis not present

## 2020-01-15 DIAGNOSIS — H9313 Tinnitus, bilateral: Secondary | ICD-10-CM | POA: Diagnosis not present

## 2020-01-15 DIAGNOSIS — Z85118 Personal history of other malignant neoplasm of bronchus and lung: Secondary | ICD-10-CM | POA: Diagnosis not present

## 2020-01-15 DIAGNOSIS — J441 Chronic obstructive pulmonary disease with (acute) exacerbation: Secondary | ICD-10-CM | POA: Diagnosis not present

## 2020-01-15 DIAGNOSIS — E039 Hypothyroidism, unspecified: Secondary | ICD-10-CM | POA: Diagnosis not present

## 2020-01-15 DIAGNOSIS — Z0001 Encounter for general adult medical examination with abnormal findings: Secondary | ICD-10-CM | POA: Diagnosis not present

## 2020-01-15 DIAGNOSIS — Z853 Personal history of malignant neoplasm of breast: Secondary | ICD-10-CM | POA: Diagnosis not present

## 2020-01-15 DIAGNOSIS — D696 Thrombocytopenia, unspecified: Secondary | ICD-10-CM | POA: Diagnosis not present

## 2020-01-17 DIAGNOSIS — H903 Sensorineural hearing loss, bilateral: Secondary | ICD-10-CM | POA: Diagnosis not present

## 2020-01-17 DIAGNOSIS — H9313 Tinnitus, bilateral: Secondary | ICD-10-CM | POA: Diagnosis not present

## 2020-01-17 DIAGNOSIS — H9123 Sudden idiopathic hearing loss, bilateral: Secondary | ICD-10-CM | POA: Diagnosis not present

## 2020-01-31 DIAGNOSIS — H903 Sensorineural hearing loss, bilateral: Secondary | ICD-10-CM | POA: Diagnosis not present

## 2020-01-31 DIAGNOSIS — H9123 Sudden idiopathic hearing loss, bilateral: Secondary | ICD-10-CM | POA: Diagnosis not present

## 2020-01-31 DIAGNOSIS — H9313 Tinnitus, bilateral: Secondary | ICD-10-CM | POA: Diagnosis not present

## 2020-02-12 DIAGNOSIS — H9313 Tinnitus, bilateral: Secondary | ICD-10-CM | POA: Diagnosis not present

## 2020-02-12 DIAGNOSIS — D519 Vitamin B12 deficiency anemia, unspecified: Secondary | ICD-10-CM | POA: Diagnosis not present

## 2020-02-12 DIAGNOSIS — D508 Other iron deficiency anemias: Secondary | ICD-10-CM | POA: Diagnosis not present

## 2020-02-12 DIAGNOSIS — E039 Hypothyroidism, unspecified: Secondary | ICD-10-CM | POA: Diagnosis not present

## 2020-02-12 DIAGNOSIS — D649 Anemia, unspecified: Secondary | ICD-10-CM | POA: Diagnosis not present

## 2020-02-12 DIAGNOSIS — D61818 Other pancytopenia: Secondary | ICD-10-CM | POA: Diagnosis not present

## 2020-02-12 DIAGNOSIS — C3490 Malignant neoplasm of unspecified part of unspecified bronchus or lung: Secondary | ICD-10-CM | POA: Diagnosis not present

## 2020-02-12 DIAGNOSIS — H9041 Sensorineural hearing loss, unilateral, right ear, with unrestricted hearing on the contralateral side: Secondary | ICD-10-CM | POA: Diagnosis not present

## 2020-02-12 DIAGNOSIS — D72819 Decreased white blood cell count, unspecified: Secondary | ICD-10-CM | POA: Diagnosis not present

## 2020-02-12 DIAGNOSIS — D509 Iron deficiency anemia, unspecified: Secondary | ICD-10-CM | POA: Diagnosis not present

## 2020-02-12 DIAGNOSIS — D500 Iron deficiency anemia secondary to blood loss (chronic): Secondary | ICD-10-CM | POA: Diagnosis not present

## 2020-02-12 DIAGNOSIS — E782 Mixed hyperlipidemia: Secondary | ICD-10-CM | POA: Diagnosis not present

## 2020-02-12 DIAGNOSIS — D696 Thrombocytopenia, unspecified: Secondary | ICD-10-CM | POA: Diagnosis not present

## 2020-02-12 DIAGNOSIS — E559 Vitamin D deficiency, unspecified: Secondary | ICD-10-CM | POA: Diagnosis not present

## 2020-02-26 ENCOUNTER — Inpatient Hospital Stay (HOSPITAL_COMMUNITY): Payer: Medicare Other | Attending: Hematology

## 2020-02-26 ENCOUNTER — Other Ambulatory Visit: Payer: Self-pay

## 2020-02-26 DIAGNOSIS — C3412 Malignant neoplasm of upper lobe, left bronchus or lung: Secondary | ICD-10-CM | POA: Insufficient documentation

## 2020-02-26 DIAGNOSIS — Z86010 Personal history of colonic polyps: Secondary | ICD-10-CM | POA: Diagnosis not present

## 2020-02-26 DIAGNOSIS — Z87891 Personal history of nicotine dependence: Secondary | ICD-10-CM | POA: Insufficient documentation

## 2020-02-26 DIAGNOSIS — R0602 Shortness of breath: Secondary | ICD-10-CM | POA: Diagnosis not present

## 2020-02-26 DIAGNOSIS — E538 Deficiency of other specified B group vitamins: Secondary | ICD-10-CM | POA: Diagnosis not present

## 2020-02-26 DIAGNOSIS — Z79899 Other long term (current) drug therapy: Secondary | ICD-10-CM | POA: Diagnosis not present

## 2020-02-26 DIAGNOSIS — M1990 Unspecified osteoarthritis, unspecified site: Secondary | ICD-10-CM | POA: Diagnosis not present

## 2020-02-26 DIAGNOSIS — R200 Anesthesia of skin: Secondary | ICD-10-CM | POA: Diagnosis not present

## 2020-02-26 DIAGNOSIS — G629 Polyneuropathy, unspecified: Secondary | ICD-10-CM | POA: Insufficient documentation

## 2020-02-26 DIAGNOSIS — D61818 Other pancytopenia: Secondary | ICD-10-CM | POA: Diagnosis not present

## 2020-02-26 DIAGNOSIS — C50912 Malignant neoplasm of unspecified site of left female breast: Secondary | ICD-10-CM

## 2020-02-26 DIAGNOSIS — Z7952 Long term (current) use of systemic steroids: Secondary | ICD-10-CM | POA: Diagnosis not present

## 2020-02-26 DIAGNOSIS — Z8719 Personal history of other diseases of the digestive system: Secondary | ICD-10-CM | POA: Diagnosis not present

## 2020-02-26 DIAGNOSIS — Z809 Family history of malignant neoplasm, unspecified: Secondary | ICD-10-CM | POA: Insufficient documentation

## 2020-02-26 LAB — COMPREHENSIVE METABOLIC PANEL
ALT: 11 U/L (ref 0–44)
AST: 18 U/L (ref 15–41)
Albumin: 4 g/dL (ref 3.5–5.0)
Alkaline Phosphatase: 51 U/L (ref 38–126)
Anion gap: 8 (ref 5–15)
BUN: 28 mg/dL — ABNORMAL HIGH (ref 8–23)
CO2: 29 mmol/L (ref 22–32)
Calcium: 9.5 mg/dL (ref 8.9–10.3)
Chloride: 102 mmol/L (ref 98–111)
Creatinine, Ser: 1.33 mg/dL — ABNORMAL HIGH (ref 0.44–1.00)
GFR calc Af Amer: 43 mL/min — ABNORMAL LOW (ref >60)
GFR calc non Af Amer: 37 mL/min — ABNORMAL LOW (ref >60)
Glucose, Bld: 95 mg/dL (ref 70–99)
Potassium: 4.4 mmol/L (ref 3.5–5.1)
Sodium: 139 mmol/L (ref 135–145)
Total Bilirubin: 0.7 mg/dL (ref 0.3–1.2)
Total Protein: 8.2 g/dL — ABNORMAL HIGH (ref 6.5–8.1)

## 2020-02-26 LAB — IRON AND TIBC
Iron: 53 ug/dL (ref 28–170)
Saturation Ratios: 14 % (ref 10.4–31.8)
TIBC: 369 ug/dL (ref 250–450)
UIBC: 316 ug/dL

## 2020-02-26 LAB — CBC WITH DIFFERENTIAL/PLATELET
Abs Immature Granulocytes: 0.2 10*3/uL — ABNORMAL HIGH (ref 0.00–0.07)
Basophils Absolute: 0 10*3/uL (ref 0.0–0.1)
Basophils Relative: 1 %
Eosinophils Absolute: 0 10*3/uL (ref 0.0–0.5)
Eosinophils Relative: 0 %
HCT: 38.5 % (ref 36.0–46.0)
Hemoglobin: 11.3 g/dL — ABNORMAL LOW (ref 12.0–15.0)
Immature Granulocytes: 5 %
Lymphocytes Relative: 41 %
Lymphs Abs: 1.7 10*3/uL (ref 0.7–4.0)
MCH: 31.1 pg (ref 26.0–34.0)
MCHC: 29.4 g/dL — ABNORMAL LOW (ref 30.0–36.0)
MCV: 106.1 fL — ABNORMAL HIGH (ref 80.0–100.0)
Monocytes Absolute: 1.4 10*3/uL — ABNORMAL HIGH (ref 0.1–1.0)
Monocytes Relative: 35 %
Neutro Abs: 0.7 10*3/uL — ABNORMAL LOW (ref 1.7–7.7)
Neutrophils Relative %: 18 %
Platelets: 111 10*3/uL — ABNORMAL LOW (ref 150–400)
RBC: 3.63 MIL/uL — ABNORMAL LOW (ref 3.87–5.11)
RDW: 15.5 % (ref 11.5–15.5)
WBC: 4.1 10*3/uL (ref 4.0–10.5)
nRBC: 0.7 % — ABNORMAL HIGH (ref 0.0–0.2)

## 2020-02-26 LAB — LACTATE DEHYDROGENASE: LDH: 202 U/L — ABNORMAL HIGH (ref 98–192)

## 2020-02-26 LAB — VITAMIN B12: Vitamin B-12: 323 pg/mL (ref 180–914)

## 2020-02-26 LAB — VITAMIN D 25 HYDROXY (VIT D DEFICIENCY, FRACTURES): Vit D, 25-Hydroxy: 78.31 ng/mL (ref 30–100)

## 2020-02-26 LAB — FERRITIN: Ferritin: 200 ng/mL (ref 11–307)

## 2020-02-27 ENCOUNTER — Inpatient Hospital Stay (HOSPITAL_COMMUNITY): Payer: Medicare Other

## 2020-02-27 ENCOUNTER — Inpatient Hospital Stay (HOSPITAL_BASED_OUTPATIENT_CLINIC_OR_DEPARTMENT_OTHER): Payer: Medicare Other | Admitting: Hematology

## 2020-02-27 ENCOUNTER — Other Ambulatory Visit: Payer: Self-pay

## 2020-02-27 VITALS — BP 147/53 | HR 64 | Temp 96.2°F | Resp 16 | Wt 213.0 lb

## 2020-02-27 DIAGNOSIS — D61818 Other pancytopenia: Secondary | ICD-10-CM | POA: Diagnosis not present

## 2020-02-27 DIAGNOSIS — C50912 Malignant neoplasm of unspecified site of left female breast: Secondary | ICD-10-CM | POA: Diagnosis not present

## 2020-02-27 DIAGNOSIS — C3412 Malignant neoplasm of upper lobe, left bronchus or lung: Secondary | ICD-10-CM | POA: Diagnosis not present

## 2020-02-27 DIAGNOSIS — R0602 Shortness of breath: Secondary | ICD-10-CM | POA: Diagnosis not present

## 2020-02-27 DIAGNOSIS — E538 Deficiency of other specified B group vitamins: Secondary | ICD-10-CM | POA: Diagnosis not present

## 2020-02-27 DIAGNOSIS — G629 Polyneuropathy, unspecified: Secondary | ICD-10-CM | POA: Diagnosis not present

## 2020-02-27 DIAGNOSIS — R200 Anesthesia of skin: Secondary | ICD-10-CM | POA: Diagnosis not present

## 2020-02-27 NOTE — Patient Instructions (Signed)
[facility] at [facility] Discharge Instructions  You were seen today by Dr. Katragadda. He went over your recent lab results. He will see you back in 4 months for labs and follow up.   Thank you for choosing [facility] at [facility] to provide your oncology and hematology care.  To afford each patient quality time with our provider, please arrive at least 15 minutes before your scheduled appointment time.   If you have a lab appointment with the Cancer Center please come in thru the  Main Entrance and check in at the main information desk  You need to re-schedule your appointment should you arrive 10 or more minutes late.  We strive to give you quality time with our providers, and arriving late affects you and other patients whose appointments are after yours.  Also, if you no show three or more times for appointments you may be dismissed from the clinic at the providers discretion.     Again, thank you for choosing [facility].  Our hope is that these requests will decrease the amount of time that you wait before being seen by our physicians.       _____________________________________________________________  Should you have questions after your visit to [facility], please contact our office at [phone] between the hours of 8:00 a.m. and 4:30 p.m.  Voicemails left after 4:00 p.m. will not be returned until the following business day.  For prescription refill requests, have your pharmacy contact our office and allow 72 hours.    Cancer Center Support Programs:   > Cancer Support Group  2nd Tuesday of the month 1pm-2pm, Journey Room    

## 2020-02-27 NOTE — Assessment & Plan Note (Signed)
1. Pancytopenia: -Initially evaluated at the request of Dr. Nevada Crane for pancytopenia. Subsequently her white count has recovered along with recovery of the hemoglobin. -CBC from 2020-02-26 shows white count 4.1, hemoglobin 11.5, platelet count 111. -We will see her back in 4 months for follow-up. We will plan to do a bone marrow aspiration and biopsy if there is any significant changes.  2. B12 deficiency: -She will continue B12 1 mg tablet daily as her labs in May 2020 showed increased methylmalonic acid.  3. Stage I left breast cancer: -Diagnosed in Sep 1999, treated with lumpectomy and XRT. ER/PR was positive and received tamoxifen for 5 years. -Mammogram on 2019-10-30 was BI-RADS Category 2. Physical exam did not reveal any palpable masses today.  4. Peripheral neuropathy: -She will continue gabapentin 900 mg at bedtime. She is taking 600 mg in the morning and 600 mg at noon.  5. Stage I adenocarcinoma of the left upper lobe of the lung: -Left upper lobectomy on 2005-06-01. CT chest on 2018-06-09 was stable.

## 2020-02-27 NOTE — Progress Notes (Signed)
Lisa [name], [place] [address]   CLINIC:  Medical Oncology/Hematology  PCP:  Celene Squibb, MD [place] [address] [phone]   REASON FOR VISIT:  Follow-up forPancytopenia    BRIEF ONCOLOGIC HISTORY:  Oncology History  Adenocarcinoma of left lung ([place])  06/23/2016 Imaging   CT chest- Stable exam. No new or progressive findings. Previously described tiny perifissural nodules at the base of the right major fissure are stable and likely represent subpleural lymph nodes.      CANCER STAGING: Cancer Staging Invasive ductal carcinoma of left breast [facility]) Staging form: Breast, AJCC 7th Edition - Pathologic stage from 07/03/1998: Stage IA (T1a, N0, cM0) - Signed by Baird Cancer, PA-C on 06/09/2016    INTERVAL HISTORY:  Lisa [name] 86 y.o. female seen for follow-up of pancytopenia. She is reportedly taking B12 1 mg daily. Appetite and energy levels in 100%. Numbness in the hands and feet has been stable. Shortness of breath on exertion is also stable. Denies any new onset pains. No infections or hospitalizations recently.   REVIEW OF SYSTEMS:  Review of Systems  Respiratory: Positive for shortness of breath.   Neurological: Positive for numbness. Negative for dizziness.  All other systems reviewed and are negative.    PAST MEDICAL/SURGICAL HISTORY:  Past Medical History:  Diagnosis Date  . Adenocarcinoma of left lung ([place]) 2006  . Arthritis   . Asthma   . Cancer of breast, female ([place])   . Cancer of lung ([place])   . Hypertension   . Hypothyroidism   . Invasive ductal carcinoma of left breast ([place]) 1999  . Neutropenia ([place]) 06/09/2016  . Personal history of radiation therapy    Past Surgical History:  Procedure Laterality Date  . BREAST BIOPSY     left axillary node dissection  . BREAST LUMPECTOMY Left   . COLONOSCOPY  03/08/2003   JQB:HALPFXTKWI rectal polyps destroyed with the tip of the snare/Polyps at  hepatic flexure, splenic flexure at 35 cm/Left-sided diverticula: unable to retrieve path  . COLONOSCOPY  09/12/2008   OXB:DZHGDJ rectum and distal sigmoid diminutive polyps/scattered left sided diverticulum. hyperplastic  . COLONOSCOPY N/A 03/06/2013   MEQ:ASTMHDQ polyp-removed as described above; colonic diverticulosis. hyperplastic polyps. next TCS 03/2018  . FOOT SURGERY    . LUNG REMOVAL, PARTIAL     upper lobe     SOCIAL HISTORY:  Social History   Socioeconomic History  . Marital status: Widowed    Spouse name: Not on file  . Number of children: Not on file  . Years of education: Not on file  . Highest education level: Not on file  Occupational History  . Not on file  Tobacco Use  . Smoking status: Former Smoker    Packs/day: 0.75    Years: 35.00    Pack years: 26.25    Quit date: 1993    Years since quitting: 28.3  . Smokeless tobacco: Never Used  . Tobacco comment: smoke-free X 30 yeras  Substance and Sexual Activity  . Alcohol use: No  . Drug use: No  . Sexual activity: Not on file  Other Topics Concern  . Not on file  Social History Narrative  . Not on file   Social Determinants of Health   Financial Resource Strain:   . Difficulty of Paying Living Expenses:   Food Insecurity:   . Worried About Charity fundraiser in the Last Year:   . Ran  Out of Food in the Last Year:   Transportation Needs:   . Lack of Transportation (Medical):   Marland Kitchen Lack of Transportation (Non-Medical):   Physical Activity:   . Days of Exercise per Week:   . Minutes of Exercise per Session:   Stress:   . Feeling of Stress :   Social Connections:   . Frequency of Communication with Friends and Family:   . Frequency of Social Gatherings with Friends and Family:   . Attends Religious Services:   . Active Member of Clubs or Organizations:   . Attends Archivist Meetings:   Marland Kitchen Marital Status:   Intimate Partner Violence:   . Fear of Current or Ex-Partner:   . Emotionally  Abused:   Marland Kitchen Physically Abused:   . Sexually Abused:     FAMILY HISTORY:  Family History  Problem Relation Age of Onset  . Cancer Mother   . Cancer Sister   . Colon cancer Neg Hx     CURRENT MEDICATIONS:  Outpatient Encounter Medications as of 02/27/2020  Medication Sig  . albuterol (PROVENTIL,VENTOLIN) 90 MCG/ACT inhaler Inhale 2 puffs into the lungs every 6 (six) hours as needed for wheezing.  . clobetasol ointment (TEMOVATE) 7.20 % Apply 1 application topically as needed.   . ferrous sulfate 325 (65 FE) MG tablet Take 325 mg by mouth daily with breakfast.  . gabapentin (NEURONTIN) 600 MG tablet Take 600 mg by mouth 3 (three) times daily.   Marland Kitchen levothyroxine (SYNTHROID, LEVOTHROID) 88 MCG tablet Take 88 mcg by mouth daily.   . meloxicam (MOBIC) 7.5 MG tablet Take 7.5 mg by mouth daily.   . metoprolol succinate (TOPROL-XL) 50 MG 24 hr tablet Take 50 mg by mouth daily. Take with or immediately following a meal.  . Multiple Vitamin (MULTIVITAMIN WITH MINERALS) TABS tablet Take 1 tablet by mouth daily.  Marland Kitchen telmisartan-hydrochlorothiazide (MICARDIS HCT) 80-12.5 MG per tablet Take 1 tablet by mouth daily.  . TRELEGY ELLIPTA 100-62.5-25 MCG/INH AEPB Inhale 1 puff into the lungs daily.   Marland Kitchen VITAMIN D, ERGOCALCIFEROL, PO Take 1 capsule by mouth daily.  . predniSONE (DELTASONE) 20 MG tablet Take 60 mg by mouth daily.  . [DISCONTINUED] albuterol (VENTOLIN HFA) 108 (90 Base) MCG/ACT inhaler   . [DISCONTINUED] levothyroxine (SYNTHROID) 100 MCG tablet    No facility-administered encounter medications on file as of 02/27/2020.    ALLERGIES:  No Known Allergies   PHYSICAL EXAM:  ECOG Performance status: 1  Vitals:   02/27/20 1100  BP: (!) 147/53  Pulse: 64  Resp: 16  Temp: (!) 96.2 F (35.7 C)  SpO2: 94%   Filed Weights   02/27/20 1100  Weight: 213 lb (96.6 kg)    Physical Exam Vitals reviewed.  Constitutional:      Appearance: Normal appearance.  Cardiovascular:     Rate and  Rhythm: Normal rate and regular rhythm.     Heart sounds: Normal heart sounds.  Pulmonary:     Effort: Pulmonary effort is normal.     Breath sounds: Normal breath sounds.  Abdominal:     General: There is no distension.     Palpations: Abdomen is soft. There is no mass.  Musculoskeletal:        General: No swelling.  Skin:    General: Skin is warm.  Neurological:     General: No focal deficit present.     Mental Status: She is alert and oriented to person, place, and time.  Psychiatric:  Mood and Affect: Mood normal.        Behavior: Behavior normal.      LABORATORY DATA:  I have reviewed the labs as listed.  CBC    Component Value Date/Time   WBC 4.1 02/26/2020 1049   RBC 3.63 (L) 02/26/2020 1049   HGB 11.3 (L) 02/26/2020 1049   HCT 38.5 02/26/2020 1049   PLT 111 (L) 02/26/2020 1049   MCV 106.1 (H) 02/26/2020 1049   MCH 31.1 02/26/2020 1049   MCHC 29.4 (L) 02/26/2020 1049   RDW 15.5 02/26/2020 1049   LYMPHSABS 1.7 02/26/2020 1049   MONOABS 1.4 (H) 02/26/2020 1049   EOSABS 0.0 02/26/2020 1049   BASOSABS 0.0 02/26/2020 1049   CMP Latest Ref Rng & Units 02/26/2020 12/19/2019 10/05/2019  Glucose 70 - 99 mg/dL 95 104(H) 94  BUN 8 - 23 mg/dL 28(H) 22 17  Creatinine 0.44 - 1.00 mg/dL 1.33(H) 1.23(H) 1.16(H)  Sodium 135 - 145 mmol/L 139 138 134(L)  Potassium 3.5 - 5.1 mmol/L 4.4 4.3 4.4  Chloride 98 - 111 mmol/L 102 102 99  CO2 22 - 32 mmol/L 29 27 25   Calcium 8.9 - 10.3 mg/dL 9.5 9.3 9.2  Total Protein 6.5 - 8.1 g/dL 8.2(H) 8.5(H) 8.2(H)  Total Bilirubin 0.3 - 1.2 mg/dL 0.7 0.5 0.7  Alkaline Phos 38 - 126 U/L 51 51 50  AST 15 - 41 U/L 18 22 18   ALT 0 - 44 U/L 11 14 11        DIAGNOSTIC IMAGING:  I have reviewed scans.   I have reviewed Venita Lick LPN's note and agree with the documentation.  I personally performed a face-to-face visit, made revisions and my assessment and plan is as follows.    ASSESSMENT & PLAN:   Other pancytopenia ([place]) 1.  Pancytopenia: -Initially evaluated at the request of Dr. [place] Crane for pancytopenia. Subsequently her white count has recovered along with recovery of the hemoglobin. -CBC from 2020-02-26 shows white count 4.1, hemoglobin 11.5, platelet count 111. -We will see her back in 4 months for follow-up. We will plan to do a bone marrow aspiration and biopsy if there is any significant changes.  2. B12 deficiency: -She will continue B12 1 mg tablet daily as her labs in May 2020 showed increased methylmalonic acid.  3. Stage I left breast cancer: -Diagnosed in Sep 1999, treated with lumpectomy and XRT. ER/PR was positive and received tamoxifen for 5 years. -Mammogram on 2019-10-30 was BI-RADS Category 2. Physical exam did not reveal any palpable masses today.  4. Peripheral neuropathy: -She will continue gabapentin 900 mg at bedtime. She is taking 600 mg in the morning and 600 mg at noon.  5. Stage I adenocarcinoma of the left upper lobe of the lung: -Left upper lobectomy on 2005-06-01. CT chest on 2018-06-09 was stable.    Orders placed this encounter:  Orders Placed This Encounter  Procedures  . Iron and TIBC  . Ferritin  . Vitamin B12  . Folate  . CBC with Differential  . Comprehensive metabolic panel      Derek Jack, MD [place] [phone]

## 2020-07-09 ENCOUNTER — Other Ambulatory Visit: Payer: Self-pay

## 2020-07-09 ENCOUNTER — Inpatient Hospital Stay (HOSPITAL_COMMUNITY): Payer: Medicare Other | Attending: Hematology

## 2020-07-09 DIAGNOSIS — Z79899 Other long term (current) drug therapy: Secondary | ICD-10-CM | POA: Insufficient documentation

## 2020-07-09 DIAGNOSIS — C50912 Malignant neoplasm of unspecified site of left female breast: Secondary | ICD-10-CM

## 2020-07-09 DIAGNOSIS — C3412 Malignant neoplasm of upper lobe, left bronchus or lung: Secondary | ICD-10-CM | POA: Diagnosis not present

## 2020-07-09 DIAGNOSIS — G629 Polyneuropathy, unspecified: Secondary | ICD-10-CM | POA: Diagnosis not present

## 2020-07-09 DIAGNOSIS — Z87891 Personal history of nicotine dependence: Secondary | ICD-10-CM | POA: Diagnosis not present

## 2020-07-09 DIAGNOSIS — R0602 Shortness of breath: Secondary | ICD-10-CM | POA: Diagnosis not present

## 2020-07-09 DIAGNOSIS — M1990 Unspecified osteoarthritis, unspecified site: Secondary | ICD-10-CM | POA: Diagnosis not present

## 2020-07-09 DIAGNOSIS — G479 Sleep disorder, unspecified: Secondary | ICD-10-CM | POA: Diagnosis not present

## 2020-07-09 DIAGNOSIS — Z809 Family history of malignant neoplasm, unspecified: Secondary | ICD-10-CM | POA: Diagnosis not present

## 2020-07-09 DIAGNOSIS — Z86010 Personal history of colonic polyps: Secondary | ICD-10-CM | POA: Insufficient documentation

## 2020-07-09 DIAGNOSIS — Z853 Personal history of malignant neoplasm of breast: Secondary | ICD-10-CM | POA: Insufficient documentation

## 2020-07-09 DIAGNOSIS — Z8719 Personal history of other diseases of the digestive system: Secondary | ICD-10-CM | POA: Diagnosis not present

## 2020-07-09 DIAGNOSIS — D61818 Other pancytopenia: Secondary | ICD-10-CM | POA: Insufficient documentation

## 2020-07-09 LAB — VITAMIN B12: Vitamin B-12: 268 pg/mL (ref 180–914)

## 2020-07-09 LAB — CBC WITH DIFFERENTIAL/PLATELET
Abs Immature Granulocytes: 0.25 10*3/uL — ABNORMAL HIGH (ref 0.00–0.07)
Basophils Absolute: 0 10*3/uL (ref 0.0–0.1)
Basophils Relative: 0 %
Eosinophils Absolute: 0 10*3/uL (ref 0.0–0.5)
Eosinophils Relative: 0 %
HCT: 37.6 % (ref 36.0–46.0)
Hemoglobin: 10.9 g/dL — ABNORMAL LOW (ref 12.0–15.0)
Immature Granulocytes: 6 %
Lymphocytes Relative: 48 %
Lymphs Abs: 1.9 10*3/uL (ref 0.7–4.0)
MCH: 30.5 pg (ref 26.0–34.0)
MCHC: 29 g/dL — ABNORMAL LOW (ref 30.0–36.0)
MCV: 105.3 fL — ABNORMAL HIGH (ref 80.0–100.0)
Monocytes Absolute: 1 10*3/uL (ref 0.1–1.0)
Monocytes Relative: 24 %
Neutro Abs: 0.9 10*3/uL — ABNORMAL LOW (ref 1.7–7.7)
Neutrophils Relative %: 22 %
Platelets: 70 10*3/uL — ABNORMAL LOW (ref 150–400)
RBC: 3.57 MIL/uL — ABNORMAL LOW (ref 3.87–5.11)
RDW: 15.2 % (ref 11.5–15.5)
WBC: 4 10*3/uL (ref 4.0–10.5)
nRBC: 1.3 % — ABNORMAL HIGH (ref 0.0–0.2)

## 2020-07-09 LAB — COMPREHENSIVE METABOLIC PANEL
ALT: 12 U/L (ref 0–44)
AST: 20 U/L (ref 15–41)
Albumin: 4.1 g/dL (ref 3.5–5.0)
Alkaline Phosphatase: 46 U/L (ref 38–126)
Anion gap: 10 (ref 5–15)
BUN: 30 mg/dL — ABNORMAL HIGH (ref 8–23)
CO2: 24 mmol/L (ref 22–32)
Calcium: 9.2 mg/dL (ref 8.9–10.3)
Chloride: 102 mmol/L (ref 98–111)
Creatinine, Ser: 1.32 mg/dL — ABNORMAL HIGH (ref 0.44–1.00)
GFR calc Af Amer: 43 mL/min — ABNORMAL LOW (ref >60)
GFR calc non Af Amer: 37 mL/min — ABNORMAL LOW (ref >60)
Glucose, Bld: 97 mg/dL (ref 70–99)
Potassium: 4.2 mmol/L (ref 3.5–5.1)
Sodium: 136 mmol/L (ref 135–145)
Total Bilirubin: 1.3 mg/dL — ABNORMAL HIGH (ref 0.3–1.2)
Total Protein: 8.8 g/dL — ABNORMAL HIGH (ref 6.5–8.1)

## 2020-07-09 LAB — FERRITIN: Ferritin: 368 ng/mL — ABNORMAL HIGH (ref 11–307)

## 2020-07-09 LAB — IRON AND TIBC
Iron: 54 ug/dL (ref 28–170)
Saturation Ratios: 15 % (ref 10.4–31.8)
TIBC: 352 ug/dL (ref 250–450)
UIBC: 298 ug/dL

## 2020-07-09 LAB — FOLATE: Folate: 16.7 ng/mL (ref >5.9)

## 2020-07-16 ENCOUNTER — Other Ambulatory Visit: Payer: Self-pay

## 2020-07-16 ENCOUNTER — Inpatient Hospital Stay (HOSPITAL_BASED_OUTPATIENT_CLINIC_OR_DEPARTMENT_OTHER): Payer: Medicare Other | Admitting: Nurse Practitioner

## 2020-07-16 DIAGNOSIS — I700 Atherosclerosis of aorta: Secondary | ICD-10-CM | POA: Diagnosis not present

## 2020-07-16 DIAGNOSIS — G629 Polyneuropathy, unspecified: Secondary | ICD-10-CM | POA: Diagnosis not present

## 2020-07-16 DIAGNOSIS — D61818 Other pancytopenia: Secondary | ICD-10-CM

## 2020-07-16 DIAGNOSIS — Z85118 Personal history of other malignant neoplasm of bronchus and lung: Secondary | ICD-10-CM | POA: Diagnosis not present

## 2020-07-16 DIAGNOSIS — G479 Sleep disorder, unspecified: Secondary | ICD-10-CM | POA: Diagnosis not present

## 2020-07-16 DIAGNOSIS — M2550 Pain in unspecified joint: Secondary | ICD-10-CM | POA: Diagnosis not present

## 2020-07-16 DIAGNOSIS — Z853 Personal history of malignant neoplasm of breast: Secondary | ICD-10-CM | POA: Diagnosis not present

## 2020-07-16 DIAGNOSIS — D519 Vitamin B12 deficiency anemia, unspecified: Secondary | ICD-10-CM | POA: Diagnosis not present

## 2020-07-16 DIAGNOSIS — J441 Chronic obstructive pulmonary disease with (acute) exacerbation: Secondary | ICD-10-CM | POA: Diagnosis not present

## 2020-07-16 DIAGNOSIS — R0602 Shortness of breath: Secondary | ICD-10-CM | POA: Diagnosis not present

## 2020-07-16 DIAGNOSIS — I714 Abdominal aortic aneurysm, without rupture: Secondary | ICD-10-CM | POA: Diagnosis not present

## 2020-07-16 DIAGNOSIS — J9601 Acute respiratory failure with hypoxia: Secondary | ICD-10-CM | POA: Diagnosis not present

## 2020-07-16 DIAGNOSIS — D649 Anemia, unspecified: Secondary | ICD-10-CM | POA: Diagnosis not present

## 2020-07-16 DIAGNOSIS — R634 Abnormal weight loss: Secondary | ICD-10-CM | POA: Diagnosis not present

## 2020-07-16 DIAGNOSIS — G9009 Other idiopathic peripheral autonomic neuropathy: Secondary | ICD-10-CM | POA: Diagnosis not present

## 2020-07-16 DIAGNOSIS — M1990 Unspecified osteoarthritis, unspecified site: Secondary | ICD-10-CM | POA: Diagnosis not present

## 2020-07-16 DIAGNOSIS — D508 Other iron deficiency anemias: Secondary | ICD-10-CM | POA: Diagnosis not present

## 2020-07-16 DIAGNOSIS — C3412 Malignant neoplasm of upper lobe, left bronchus or lung: Secondary | ICD-10-CM | POA: Diagnosis not present

## 2020-07-16 NOTE — Assessment & Plan Note (Addendum)
1.  Pancytopenia: -Patient referred to [place] by Dr. [place] Crane for work-up of pancytopenia.  CBC on 03/01/2019 showed WBC of 3.3, hemoglobin 9.7, MCV of 94, platelets of 147 with 22% neutrophils and ANC of 700.  B12 was normal. -Her labs on 03/13/2019 showed increased methylmalonic acid with a normal B12 level consistent with vitamin B12 deficiency.  We gave her 1 shot of B12 injection and started her on B12 tablets daily. -Hemoglobin remained stable, neutrophil count is actually improved to 1900.  She has no active signs of infection.  We will continue to monitor blood counts. -If there is any deterioration of her ANC, we will consider bone marrow aspiration and biopsy.  Differential diagnosis including MDS. -Labs done on 07/16/2020 showed her [place] at 900, hemoglobin 10.9, creatinine 1.32, platelets 70 -We will talk about bone marrow biopsy and aspiration if her ANC and platelets further deteriorate. - She will follow-up in 4 months with repeat labs.  2.  Peripheral neuropathy: -She has some numbness in the fingertips and toes.  She was taking gabapentin 600 mg 3 times a day. -For the past 2 weeks, she reported increased pain in her hands.  Pains are described as achy in nature.  Aleve helps somewhat. -I recommended her to increase her gabapentin to 900 mg at bedtime till her pain resolves.  3.  Stage Ia left breast cancer: -She was diagnosed September 1999.  Treated with lumpectomy and XRT.  ER/PR was positive. -She received tamoxifen for 5 years. -Mammogram on 10/30/2019 was B RADS category 2 benign. -She will be scheduled for her next mammogram in 1 year.  4.  Stage I adenocarcinoma of the left upper lobe of the lung: -He underwent left upper lobectomy on 06/01/2005. -Last CT of the chest with contrast on 06/09/2018 showed stable exam with no new findings.

## 2020-07-16 NOTE — Progress Notes (Signed)
[address] [place], [place] [address]   CLINIC:  Medical Oncology/Hematology  PCP:  Celene Squibb, MD [place] [address] [phone]   REASON FOR VISIT: Follow-up for pancytopenia   CURRENT THERAPY: Observation  BRIEF ONCOLOGIC HISTORY:  Oncology History  Adenocarcinoma of left lung ([place])  06/23/2016 Imaging   CT chest- Stable exam. No new or progressive findings. Previously described tiny perifissural nodules at the base of the right major fissure are stable and likely represent subpleural lymph nodes.     CANCER STAGING: Cancer Staging Invasive ductal carcinoma of left breast [facility]) Staging form: Breast, AJCC 7th Edition - Pathologic stage from 07/03/1998: Stage IA (T1a, N0, cM0) - Signed by Baird Cancer, PA-C on 06/09/2016    INTERVAL HISTORY:  Lisa [name] 86 y.o. female returns for routine follow-up for pancytopenia.  Patient reports she is doing well since her last visit.  She denies any bleeding issues.  She denies any new infections.  Denies any nausea, vomiting, or diarrhea. Denies any new pains. Had not noticed any recent bleeding such as epistaxis, hematuria or hematochezia. Denies recent chest pain on exertion, shortness of breath on minimal exertion, pre-syncopal episodes, or palpitations. Denies any numbness or tingling in hands or feet. Denies any recent fevers, infections, or recent hospitalizations. Patient reports appetite at 75% and energy level at 50%.  She is eating well maintain her weight this time.     REVIEW OF SYSTEMS:  Review of Systems  Respiratory: Positive for shortness of breath.   Psychiatric/Behavioral: Positive for sleep disturbance.  All other systems reviewed and are negative.    PAST MEDICAL/SURGICAL HISTORY:  Past Medical History:  Diagnosis Date   Adenocarcinoma of left lung ([place]) 2006   Arthritis    Asthma    Cancer of breast, female ([place])    Cancer of lung ([place])     Hypertension    Hypothyroidism    Invasive ductal carcinoma of left breast ([place]) 1999   Neutropenia ([place]) 06/09/2016   Personal history of radiation therapy    Past Surgical History:  Procedure Laterality Date   BREAST BIOPSY     left axillary node dissection   BREAST LUMPECTOMY Left    COLONOSCOPY  03/08/2003   GNF:AOZHYQMVHQ rectal polyps destroyed with the tip of the snare/Polyps at hepatic flexure, splenic flexure at 35 cm/Left-sided diverticula: unable to retrieve path   COLONOSCOPY  09/12/2008   ION:GEXBMW rectum and distal sigmoid diminutive polyps/scattered left sided diverticulum. hyperplastic   COLONOSCOPY N/A 03/06/2013   UXL:KGMWNUU polyp-removed as described above; colonic diverticulosis. hyperplastic polyps. next TCS 03/2018   FOOT SURGERY     LUNG REMOVAL, PARTIAL     upper lobe     SOCIAL HISTORY:  Social History   Socioeconomic History   Marital status: Widowed    Spouse name: Not on file   Number of children: Not on file   Years of education: Not on file   Highest education level: Not on file  Occupational History   Not on file  Tobacco Use   Smoking status: Former Smoker    Packs/day: 0.75    Years: 35.00    Pack years: 26.25    Quit date: 1993    Years since quitting: 28.7   Smokeless tobacco: Never Used   Tobacco comment: smoke-free X 30 yeras  Vaping Use   Vaping Use: Never used  Substance and Sexual Activity   Alcohol use: No  Drug use: No   Sexual activity: Not on file  Other Topics Concern   Not on file  Social History Narrative   Not on file   Social Determinants of Health   Financial Resource Strain:    Difficulty of Paying Living Expenses: Not on file  Food Insecurity:    Worried About [place] in the Last Year: Not on file   Ran Out of Food in the Last Year: Not on file  Transportation Needs:    Lack of Transportation (Medical): Not on file   Lack of Transportation (Non-Medical): Not on  file  Physical Activity:    Days of Exercise per Week: Not on file   Minutes of Exercise per Session: Not on file  Stress:    Feeling of Stress : Not on file  Social Connections:    Frequency of Communication with Friends and Family: Not on file   Frequency of Social Gatherings with Friends and Family: Not on file   Attends Religious Services: Not on file   Active Member of Clubs or Organizations: Not on file   Attends Archivist Meetings: Not on file   Marital Status: Not on file  Intimate Partner Violence:    Fear of Current or Ex-Partner: Not on file   Emotionally Abused: Not on file   Physically Abused: Not on file   Sexually Abused: Not on file    FAMILY HISTORY:  Family History  Problem Relation Age of Onset   Cancer Mother    Cancer Sister    Colon cancer Neg Hx     CURRENT MEDICATIONS:  Outpatient Encounter Medications as of 07/16/2020  Medication Sig   albuterol (PROVENTIL,VENTOLIN) 90 MCG/ACT inhaler Inhale 2 puffs into the lungs every 6 (six) hours as needed for wheezing.   clobetasol ointment (TEMOVATE) 8.03 % Apply 1 application topically as needed.    ferrous sulfate 325 (65 FE) MG tablet Take 325 mg by mouth daily with breakfast.   gabapentin (NEURONTIN) 600 MG tablet Take 600 mg by mouth 3 (three) times daily.    levothyroxine (SYNTHROID, LEVOTHROID) 88 MCG tablet Take 88 mcg by mouth daily.    meloxicam (MOBIC) 7.5 MG tablet Take 7.5 mg by mouth daily.    metoprolol succinate (TOPROL-XL) 50 MG 24 hr tablet Take 50 mg by mouth daily. Take with or immediately following a meal.   Multiple Vitamin (MULTIVITAMIN WITH MINERALS) TABS tablet Take 1 tablet by mouth daily.   predniSONE (DELTASONE) 20 MG tablet Take 60 mg by mouth daily.   telmisartan-hydrochlorothiazide (MICARDIS HCT) 80-12.5 MG per tablet Take 1 tablet by mouth daily.   TRELEGY ELLIPTA 100-62.5-25 MCG/INH AEPB Inhale 1 puff into the lungs daily.    VITAMIN D,  ERGOCALCIFEROL, PO Take 1 capsule by mouth daily.   [DISCONTINUED] SYNTHROID 100 MCG tablet Take 100 mcg by mouth daily.   No facility-administered encounter medications on file as of 07/16/2020.    ALLERGIES:  No Known Allergies   PHYSICAL EXAM:  ECOG Performance status: 1  Vitals:   07/16/20 1118  BP: (!) 153/59  Pulse: 75  Resp: 20  Temp: (!) 97 F (36.1 C)  SpO2: 97%   Filed Weights   07/16/20 1118  Weight: 210 lb 8.6 oz (95.5 kg)   Physical Exam Constitutional:      Appearance: Normal appearance. She is normal weight.  Cardiovascular:     Rate and Rhythm: Normal rate and regular rhythm.     Heart sounds:  Normal heart sounds.  Pulmonary:     Effort: Pulmonary effort is normal.     Breath sounds: Normal breath sounds.  Abdominal:     General: Bowel sounds are normal.     Palpations: Abdomen is soft.  Musculoskeletal:        General: Normal range of motion.  Skin:    General: Skin is warm.  Neurological:     Mental Status: She is alert and oriented to person, place, and time. Mental status is at baseline.  Psychiatric:        Mood and Affect: Mood normal.        Behavior: Behavior normal.        Thought Content: Thought content normal.        Judgment: Judgment normal.      LABORATORY DATA:  I have reviewed the labs as listed.  CBC    Component Value Date/Time   WBC 4.0 07/09/2020 1050   RBC 3.57 (L) 07/09/2020 1050   HGB 10.9 (L) 07/09/2020 1050   HCT 37.6 07/09/2020 1050   PLT 70 (L) 07/09/2020 1050   MCV 105.3 (H) 07/09/2020 1050   MCH 30.5 07/09/2020 1050   MCHC 29.0 (L) 07/09/2020 1050   RDW 15.2 07/09/2020 1050   LYMPHSABS 1.9 07/09/2020 1050   MONOABS 1.0 07/09/2020 1050   EOSABS 0.0 07/09/2020 1050   BASOSABS 0.0 07/09/2020 1050   CMP Latest Ref Rng & Units 07/09/2020 02/26/2020 12/19/2019  Glucose 70 - 99 mg/dL 97 95 104(H)  BUN 8 - 23 mg/dL 30(H) 28(H) 22  Creatinine 0.44 - 1.00 mg/dL 1.32(H) 1.33(H) 1.23(H)  Sodium 135 - 145 mmol/L  136 139 138  Potassium 3.5 - 5.1 mmol/L 4.2 4.4 4.3  Chloride 98 - 111 mmol/L 102 102 102  CO2 22 - 32 mmol/L '24 29 27  ' Calcium 8.9 - 10.3 mg/dL 9.2 9.5 9.3  Total Protein 6.5 - 8.1 g/dL 8.8(H) 8.2(H) 8.5(H)  Total Bilirubin 0.3 - 1.2 mg/dL 1.3(H) 0.7 0.5  Alkaline Phos 38 - 126 U/L 46 51 51  AST 15 - 41 U/L '20 18 22  ' ALT 0 - 44 U/L '12 11 14    ' All questions were answered to patient's stated satisfaction. Encouraged patient to call with any new concerns or questions before his next visit to the cancer center and we can certain see him sooner, if needed.     ASSESSMENT & PLAN:  Other pancytopenia ([place]) 1.  Pancytopenia: -Patient referred to [place] by Dr. [place] Crane for work-up of pancytopenia.  CBC on 03/01/2019 showed WBC of 3.3, hemoglobin 9.7, MCV of 94, platelets of 147 with 22% neutrophils and ANC of 700.  B12 was normal. -Her labs on 03/13/2019 showed increased methylmalonic acid with a normal B12 level consistent with vitamin B12 deficiency.  We gave her 1 shot of B12 injection and started her on B12 tablets daily. -Hemoglobin remained stable, neutrophil count is actually improved to 1900.  She has no active signs of infection.  We will continue to monitor blood counts. -If there is any deterioration of her ANC, we will consider bone marrow aspiration and biopsy.  Differential diagnosis including MDS. -Labs done on 07/16/2020 showed her [place] at 900, hemoglobin 10.9, creatinine 1.32, platelets 70 -We will talk about bone marrow biopsy and aspiration if her ANC and platelets further deteriorate. - She will follow-up in 4 months with repeat labs.  2.  Peripheral neuropathy: -She has some numbness in the fingertips and toes.  She  was taking gabapentin 600 mg 3 times a day. -For the past 2 weeks, she reported increased pain in her hands.  Pains are described as achy in nature.  Aleve helps somewhat. -I recommended her to increase her gabapentin to 900 mg at bedtime till her pain resolves.  3.   Stage Ia left breast cancer: -She was diagnosed September 1999.  Treated with lumpectomy and XRT.  ER/PR was positive. -She received tamoxifen for 5 years. -Mammogram on 10/30/2019 was B RADS category 2 benign. -She will be scheduled for her next mammogram in 1 year.  4.  Stage I adenocarcinoma of the left upper lobe of the lung: -He underwent left upper lobectomy on 06/01/2005. -Last CT of the chest with contrast on 06/09/2018 showed stable exam with no new findings.     Orders placed this encounter:  Orders Placed This Encounter  Procedures   CBC with Differential/Platelet   Comprehensive metabolic panel   Ferritin   Iron and TIBC   Lactate dehydrogenase   Vitamin B12   VITAMIN D 25 Hydroxy (Vit-D Deficiency, Fractures)      Francene Finders, FNP-C [place] [phone]

## 2020-07-23 DIAGNOSIS — D696 Thrombocytopenia, unspecified: Secondary | ICD-10-CM | POA: Diagnosis not present

## 2020-07-23 DIAGNOSIS — D509 Iron deficiency anemia, unspecified: Secondary | ICD-10-CM | POA: Diagnosis not present

## 2020-07-23 DIAGNOSIS — Z23 Encounter for immunization: Secondary | ICD-10-CM | POA: Diagnosis not present

## 2020-07-23 DIAGNOSIS — D72819 Decreased white blood cell count, unspecified: Secondary | ICD-10-CM | POA: Diagnosis not present

## 2020-07-23 DIAGNOSIS — M2550 Pain in unspecified joint: Secondary | ICD-10-CM | POA: Diagnosis not present

## 2020-07-23 DIAGNOSIS — Z853 Personal history of malignant neoplasm of breast: Secondary | ICD-10-CM | POA: Diagnosis not present

## 2020-07-23 DIAGNOSIS — D61818 Other pancytopenia: Secondary | ICD-10-CM | POA: Diagnosis not present

## 2020-07-23 DIAGNOSIS — Z85118 Personal history of other malignant neoplasm of bronchus and lung: Secondary | ICD-10-CM | POA: Diagnosis not present

## 2020-07-23 DIAGNOSIS — E559 Vitamin D deficiency, unspecified: Secondary | ICD-10-CM | POA: Diagnosis not present

## 2020-07-23 DIAGNOSIS — J441 Chronic obstructive pulmonary disease with (acute) exacerbation: Secondary | ICD-10-CM | POA: Diagnosis not present

## 2020-07-23 DIAGNOSIS — H9313 Tinnitus, bilateral: Secondary | ICD-10-CM | POA: Diagnosis not present

## 2020-07-23 DIAGNOSIS — E039 Hypothyroidism, unspecified: Secondary | ICD-10-CM | POA: Diagnosis not present

## 2020-09-06 DIAGNOSIS — Z23 Encounter for immunization: Secondary | ICD-10-CM | POA: Diagnosis not present

## 2020-09-17 ENCOUNTER — Other Ambulatory Visit (HOSPITAL_COMMUNITY): Payer: Medicare Other

## 2020-09-24 ENCOUNTER — Ambulatory Visit (HOSPITAL_COMMUNITY): Payer: Medicare Other | Admitting: Hematology

## 2020-10-01 ENCOUNTER — Other Ambulatory Visit (HOSPITAL_COMMUNITY): Payer: Self-pay | Admitting: Internal Medicine

## 2020-10-01 DIAGNOSIS — Z1231 Encounter for screening mammogram for malignant neoplasm of breast: Secondary | ICD-10-CM

## 2020-10-07 DIAGNOSIS — D696 Thrombocytopenia, unspecified: Secondary | ICD-10-CM | POA: Diagnosis not present

## 2020-10-07 DIAGNOSIS — I10 Essential (primary) hypertension: Secondary | ICD-10-CM | POA: Diagnosis not present

## 2020-10-07 DIAGNOSIS — R531 Weakness: Secondary | ICD-10-CM | POA: Diagnosis not present

## 2020-10-07 DIAGNOSIS — D509 Iron deficiency anemia, unspecified: Secondary | ICD-10-CM | POA: Diagnosis not present

## 2020-10-08 DIAGNOSIS — G9009 Other idiopathic peripheral autonomic neuropathy: Secondary | ICD-10-CM | POA: Diagnosis not present

## 2020-10-08 DIAGNOSIS — Z853 Personal history of malignant neoplasm of breast: Secondary | ICD-10-CM | POA: Diagnosis not present

## 2020-10-08 DIAGNOSIS — D649 Anemia, unspecified: Secondary | ICD-10-CM | POA: Diagnosis not present

## 2020-10-08 DIAGNOSIS — D508 Other iron deficiency anemias: Secondary | ICD-10-CM | POA: Diagnosis not present

## 2020-10-08 DIAGNOSIS — I700 Atherosclerosis of aorta: Secondary | ICD-10-CM | POA: Diagnosis not present

## 2020-10-08 DIAGNOSIS — I714 Abdominal aortic aneurysm, without rupture: Secondary | ICD-10-CM | POA: Diagnosis not present

## 2020-10-08 DIAGNOSIS — J9601 Acute respiratory failure with hypoxia: Secondary | ICD-10-CM | POA: Diagnosis not present

## 2020-10-08 DIAGNOSIS — D519 Vitamin B12 deficiency anemia, unspecified: Secondary | ICD-10-CM | POA: Diagnosis not present

## 2020-10-08 DIAGNOSIS — J441 Chronic obstructive pulmonary disease with (acute) exacerbation: Secondary | ICD-10-CM | POA: Diagnosis not present

## 2020-10-08 DIAGNOSIS — R634 Abnormal weight loss: Secondary | ICD-10-CM | POA: Diagnosis not present

## 2020-10-08 DIAGNOSIS — Z85118 Personal history of other malignant neoplasm of bronchus and lung: Secondary | ICD-10-CM | POA: Diagnosis not present

## 2020-10-08 DIAGNOSIS — M2550 Pain in unspecified joint: Secondary | ICD-10-CM | POA: Diagnosis not present

## 2020-10-14 DIAGNOSIS — R531 Weakness: Secondary | ICD-10-CM | POA: Diagnosis not present

## 2020-10-14 DIAGNOSIS — N184 Chronic kidney disease, stage 4 (severe): Secondary | ICD-10-CM | POA: Diagnosis not present

## 2020-10-14 DIAGNOSIS — D696 Thrombocytopenia, unspecified: Secondary | ICD-10-CM | POA: Diagnosis not present

## 2020-10-14 DIAGNOSIS — R799 Abnormal finding of blood chemistry, unspecified: Secondary | ICD-10-CM | POA: Diagnosis not present

## 2020-10-14 DIAGNOSIS — D649 Anemia, unspecified: Secondary | ICD-10-CM | POA: Diagnosis not present

## 2020-10-16 NOTE — Progress Notes (Signed)
[address] [place], [place] [address]   CLINIC:  Medical Oncology/Hematology  PCP:  Celene Squibb, MD [place] [id number] [phone]   REASON FOR VISIT:  Follow-up forPancytopenia    BRIEF ONCOLOGIC HISTORY:  Oncology History  Adenocarcinoma of left lung ([place])  06/23/2016 Imaging   CT chest- Stable exam. No new or progressive findings. Previously described tiny perifissural nodules at the base of the right major fissure are stable and likely represent subpleural lymph nodes.     CANCER STAGING: Cancer Staging Invasive ductal carcinoma of left breast [facility]) Staging form: Breast, AJCC 7th Edition - Pathologic stage from 07/03/1998: Stage IA (T1a, N0, cM0) - Signed by Baird Cancer, PA-C on 06/09/2016   INTERVAL HISTORY:   Ms. Lightsey 86 y.o. female seen for follow-up of pancytopenia. Daughter at bedside. Rozanna Boer Son who is in residency at [employer] [facility] [employer] on the phone. Ms Reith apparently was getting more and more tired, was reporting difficulty walking, brushing her hair, nausea, vomiting and diarrhea. She saw her PCP, who recommended prednisone taper given concern or PMR expressed by grandson. She feels better, weakness is better on prednisone, nausea, vomiting and diarrhea improved. She feels well today. She follows with Dr Raliegh Ip for some pancytopenia, macrocytic anemia and thrombocytopenia. [name] was concerned she is losing lot of weight, using wheel chair and is worried about a possible solid tumor or MDS. He is hoping that we do further evaluation.   REVIEW OF SYSTEMS:  Review of Systems  Respiratory: Positive for shortness of breath.   Neurological: Positive for numbness. Negative for dizziness.  All other systems reviewed and are negative.    PAST MEDICAL/SURGICAL HISTORY:  Past Medical History:  Diagnosis Date  . Adenocarcinoma of left lung ([place]) 2006  . Arthritis   . Asthma   . Cancer of breast, female ([place])   .  Cancer of lung ([place])   . Hypertension   . Hypothyroidism   . Invasive ductal carcinoma of left breast ([place]) 1999  . Neutropenia ([place]) 06/09/2016  . Personal history of radiation therapy    Past Surgical History:  Procedure Laterality Date  . BREAST BIOPSY     left axillary node dissection  . BREAST LUMPECTOMY Left   . COLONOSCOPY  03/08/2003   VOH:[id number] rectal polyps destroyed with the tip of the snare/Polyps at hepatic flexure, splenic flexure at 35 cm/Left-sided diverticula: unable to retrieve path  . COLONOSCOPY  09/12/2008   RSW:NIOEVO rectum and distal sigmoid diminutive polyps/scattered left sided diverticulum. hyperplastic  . COLONOSCOPY N/A 03/06/2013   JJK:[id number] polyp-removed as described above; colonic diverticulosis. hyperplastic polyps. next TCS 03/2018  . FOOT SURGERY    . LUNG REMOVAL, PARTIAL     upper lobe     SOCIAL HISTORY:  Social History   Socioeconomic History  . Marital status: Widowed    Spouse name: Not on file  . Number of children: Not on file  . Years of education: Not on file  . Highest education level: Not on file  Occupational History  . Not on file  Tobacco Use  . Smoking status: Former Smoker    Packs/day: 0.75    Years: 35.00    Pack years: 26.25    Quit date: 1993    Years since quitting: 28.9  . Smokeless tobacco: Never Used  . Tobacco comment: smoke-free X 30 yeras  Vaping Use  . Vaping Use: Never used  Substance and Sexual Activity  .  Alcohol use: No  . Drug use: No  . Sexual activity: Not on file  Other Topics Concern  . Not on file  Social History Narrative  . Not on file   Social Determinants of Health   Financial Resource Strain: Not on file  Food Insecurity: Not on file  Transportation Needs: Not on file  Physical Activity: Not on file  Stress: Not on file  Social Connections: Not on file  Intimate Partner Violence: Not on file    FAMILY HISTORY:  Family History  Problem Relation Age of Onset  . Cancer  Mother   . Cancer Sister   . Colon cancer Neg Hx     CURRENT MEDICATIONS:  Outpatient Encounter Medications as of 10/17/2020  Medication Sig  . albuterol (PROVENTIL,VENTOLIN) 90 MCG/ACT inhaler Inhale 2 puffs into the lungs every 6 (six) hours as needed for wheezing.  . clobetasol ointment (TEMOVATE) 9.93 % Apply 1 application topically as needed.   . ferrous sulfate 325 (65 FE) MG tablet Take 325 mg by mouth daily with breakfast.  . gabapentin (NEURONTIN) 600 MG tablet Take 600 mg by mouth 3 (three) times daily.   Marland Kitchen levothyroxine (SYNTHROID, LEVOTHROID) 88 MCG tablet Take 88 mcg by mouth daily.   . meloxicam (MOBIC) 7.5 MG tablet Take 7.5 mg by mouth daily.   . metoprolol succinate (TOPROL-XL) 50 MG 24 hr tablet Take 50 mg by mouth daily. Take with or immediately following a meal.  . Multiple Vitamin (MULTIVITAMIN WITH MINERALS) TABS tablet Take 1 tablet by mouth daily.  . predniSONE (DELTASONE) 20 MG tablet Take 60 mg by mouth daily.  Marland Kitchen telmisartan-hydrochlorothiazide (MICARDIS HCT) 80-12.5 MG per tablet Take 1 tablet by mouth daily.  . TRELEGY ELLIPTA 100-62.5-25 MCG/INH AEPB Inhale 1 puff into the lungs daily.   Marland Kitchen VITAMIN D, ERGOCALCIFEROL, PO Take 1 capsule by mouth daily.   No facility-administered encounter medications on file as of 10/17/2020.    ALLERGIES:  No Known Allergies   PHYSICAL EXAM:  ECOG Performance status: 1  There were no vitals filed for this visit. There were no vitals filed for this visit.  Physical Exam Vitals reviewed.  Constitutional:      Appearance: Normal appearance.  Neck:     Comments: Scar left axilla, no adenopathy cervical, axilla Cardiovascular:     Rate and Rhythm: Normal rate and regular rhythm.     Heart sounds: Normal heart sounds.  Pulmonary:     Effort: Pulmonary effort is normal.     Breath sounds: Normal breath sounds.  Abdominal:     General: There is no distension.     Palpations: Abdomen is soft. There is no mass.   Musculoskeletal:        General: No swelling.  Skin:    General: Skin is warm.  Neurological:     General: No focal deficit present.     Mental Status: She is alert and oriented to person, place, and time.     Motor: Weakness (mild proximal muscle weakness BLE) present.  Psychiatric:        Mood and Affect: Mood normal.        Behavior: Behavior normal.      LABORATORY DATA:  I have reviewed the labs as listed.  CBC    Component Value Date/Time   WBC 4.0 07/09/2020 1050   RBC 3.57 (L) 07/09/2020 1050   HGB 10.9 (L) 07/09/2020 1050   HCT 37.6 07/09/2020 1050   PLT 70 (L) 07/09/2020  1050   MCV 105.3 (H) 07/09/2020 1050   MCH 30.5 07/09/2020 1050   MCHC 29.0 (L) 07/09/2020 1050   RDW 15.2 07/09/2020 1050   LYMPHSABS 1.9 07/09/2020 1050   MONOABS 1.0 07/09/2020 1050   EOSABS 0.0 07/09/2020 1050   BASOSABS 0.0 07/09/2020 1050   CMP Latest Ref Rng & Units 07/09/2020 02/26/2020 12/19/2019  Glucose 70 - 99 mg/dL 97 95 104(H)  BUN 8 - 23 mg/dL 30(H) 28(H) 22  Creatinine 0.44 - 1.00 mg/dL 1.32(H) 1.33(H) 1.23(H)  Sodium 135 - 145 mmol/L 136 139 138  Potassium 3.5 - 5.1 mmol/L 4.2 4.4 4.3  Chloride 98 - 111 mmol/L 102 102 102  CO2 22 - 32 mmol/L 24 29 27   Calcium 8.9 - 10.3 mg/dL 9.2 9.5 9.3  Total Protein 6.5 - 8.1 g/dL 8.8(H) 8.2(H) 8.5(H)  Total Bilirubin 0.3 - 1.2 mg/dL 1.3(H) 0.7 0.5  Alkaline Phos 38 - 126 U/L 46 51 51  AST 15 - 41 U/L 20 18 22   ALT 0 - 44 U/L 12 11 14       DIAGNOSTIC IMAGING:  I have reviewed scans.  I have reviewed Venita Lick LPN's note and agree with the documentation.  I personally performed a face-to-face visit, made revisions and my assessment and plan is as follows.   ASSESSMENT & PLAN:   Pancytopenia: -Patient referred to [place] by Dr. [place] Crane for work-up of pancytopenia.  CBC on 03/01/2019 showed WBC of 3.3, hemoglobin 9.7, MCV of 94, platelets of 147 with 22% neutrophils and ANC of 700.  B12 was normal. She is here for FU with daughter,  grandson on the phone. They are worried about her symptoms, fatigue, weight loss, proximal muscle weakness, nausea, vomiting, diarrhea, she feels much better after her PCP started her on prednisone for possible PMR. They are also wondering if this has to do with her MDS and if there is role for BMB I do think its reasonable to do BMB, will make a FU with Dr Raliegh Ip after Christmas to discuss about proceeding with BMB They are agreeable.  2.  Stage Ia left breast cancer: -She was diagnosed September 1999.  Treated with lumpectomy and XRT.  ER/PR was positive. -She received tamoxifen for 5 years. -Mammogram on 10/30/2019 was B RADS category 2 benign. Next one scheduled in Jan -No regional adenopathy, breast exam deferred today, because pt and family were focused on evaluating rest of her symptoms.   3.  Stage I adenocarcinoma of the left upper lobe of the lung: -He underwent left upper lobectomy on 06/01/2005. -Last CT of the chest with contrast on 06/09/2018 showed stable exam with no new findings.  4. Nausea, vomiting, weight loss, proximal muscle weakness, diarrhea Could be autoimmune, not unreasonable to check for GI malignancy since this can be paraneoplastic symptoms of Upper GI malignancy ( especially proximal muscle weakness) Encouraged to discuss with PCP about GI and Rheumatology referral. Attempted to send an inbasket and staff message to Dr Nevada Crane, unsuccessful Will attempt reaching their office tomorrow again.  No problem-specific Assessment & Plan notes found for this encounter.    Orders placed this encounter:  No orders of the defined types were placed in this encounter.  Benay Pike MD

## 2020-10-17 ENCOUNTER — Other Ambulatory Visit (HOSPITAL_COMMUNITY): Payer: Self-pay | Admitting: Nephrology

## 2020-10-17 ENCOUNTER — Other Ambulatory Visit: Payer: Self-pay | Admitting: Nephrology

## 2020-10-17 ENCOUNTER — Encounter (HOSPITAL_COMMUNITY): Payer: Self-pay | Admitting: Hematology and Oncology

## 2020-10-17 ENCOUNTER — Other Ambulatory Visit: Payer: Self-pay

## 2020-10-17 ENCOUNTER — Inpatient Hospital Stay (HOSPITAL_COMMUNITY): Payer: Medicare Other | Attending: Hematology and Oncology | Admitting: Hematology and Oncology

## 2020-10-17 VITALS — BP 105/43 | HR 68 | Temp 97.8°F | Resp 18 | Wt 196.1 lb

## 2020-10-17 DIAGNOSIS — C50912 Malignant neoplasm of unspecified site of left female breast: Secondary | ICD-10-CM | POA: Diagnosis not present

## 2020-10-17 DIAGNOSIS — R200 Anesthesia of skin: Secondary | ICD-10-CM | POA: Diagnosis not present

## 2020-10-17 DIAGNOSIS — E039 Hypothyroidism, unspecified: Secondary | ICD-10-CM | POA: Diagnosis not present

## 2020-10-17 DIAGNOSIS — Z79899 Other long term (current) drug therapy: Secondary | ICD-10-CM | POA: Diagnosis not present

## 2020-10-17 DIAGNOSIS — R112 Nausea with vomiting, unspecified: Secondary | ICD-10-CM | POA: Insufficient documentation

## 2020-10-17 DIAGNOSIS — G35 Multiple sclerosis: Secondary | ICD-10-CM | POA: Diagnosis not present

## 2020-10-17 DIAGNOSIS — C3412 Malignant neoplasm of upper lobe, left bronchus or lung: Secondary | ICD-10-CM | POA: Insufficient documentation

## 2020-10-17 DIAGNOSIS — N170 Acute kidney failure with tubular necrosis: Secondary | ICD-10-CM | POA: Diagnosis not present

## 2020-10-17 DIAGNOSIS — Z809 Family history of malignant neoplasm, unspecified: Secondary | ICD-10-CM | POA: Diagnosis not present

## 2020-10-17 DIAGNOSIS — D61818 Other pancytopenia: Secondary | ICD-10-CM | POA: Diagnosis not present

## 2020-10-17 DIAGNOSIS — R0602 Shortness of breath: Secondary | ICD-10-CM | POA: Diagnosis not present

## 2020-10-17 DIAGNOSIS — R768 Other specified abnormal immunological findings in serum: Secondary | ICD-10-CM | POA: Diagnosis not present

## 2020-10-17 DIAGNOSIS — M1990 Unspecified osteoarthritis, unspecified site: Secondary | ICD-10-CM | POA: Insufficient documentation

## 2020-10-17 DIAGNOSIS — Z853 Personal history of malignant neoplasm of breast: Secondary | ICD-10-CM | POA: Insufficient documentation

## 2020-10-17 DIAGNOSIS — Z87891 Personal history of nicotine dependence: Secondary | ICD-10-CM | POA: Diagnosis not present

## 2020-10-17 DIAGNOSIS — M6281 Muscle weakness (generalized): Secondary | ICD-10-CM | POA: Diagnosis not present

## 2020-10-17 DIAGNOSIS — N1832 Chronic kidney disease, stage 3b: Secondary | ICD-10-CM | POA: Diagnosis not present

## 2020-10-17 DIAGNOSIS — Z7952 Long term (current) use of systemic steroids: Secondary | ICD-10-CM | POA: Insufficient documentation

## 2020-10-17 DIAGNOSIS — I129 Hypertensive chronic kidney disease with stage 1 through stage 4 chronic kidney disease, or unspecified chronic kidney disease: Secondary | ICD-10-CM | POA: Diagnosis not present

## 2020-10-17 DIAGNOSIS — I10 Essential (primary) hypertension: Secondary | ICD-10-CM | POA: Insufficient documentation

## 2020-10-17 DIAGNOSIS — D696 Thrombocytopenia, unspecified: Secondary | ICD-10-CM | POA: Diagnosis not present

## 2020-10-17 DIAGNOSIS — E875 Hyperkalemia: Secondary | ICD-10-CM | POA: Diagnosis not present

## 2020-10-17 DIAGNOSIS — R197 Diarrhea, unspecified: Secondary | ICD-10-CM | POA: Diagnosis not present

## 2020-10-17 DIAGNOSIS — E559 Vitamin D deficiency, unspecified: Secondary | ICD-10-CM | POA: Diagnosis not present

## 2020-10-17 DIAGNOSIS — D638 Anemia in other chronic diseases classified elsewhere: Secondary | ICD-10-CM | POA: Diagnosis not present

## 2020-10-18 ENCOUNTER — Encounter (HOSPITAL_COMMUNITY): Payer: Self-pay | Admitting: Hematology and Oncology

## 2020-10-21 ENCOUNTER — Encounter (HOSPITAL_COMMUNITY): Payer: Self-pay | Admitting: Pediatrics

## 2020-10-21 ENCOUNTER — Emergency Department (HOSPITAL_COMMUNITY): Payer: Medicare Other

## 2020-10-21 ENCOUNTER — Other Ambulatory Visit: Payer: Self-pay

## 2020-10-21 ENCOUNTER — Inpatient Hospital Stay (HOSPITAL_COMMUNITY)
Admission: EM | Admit: 2020-10-21 | Discharge: 2020-10-30 | DRG: 682 | Disposition: A | Discharge status: Rehabilitation Facility | Payer: Medicare Other | Attending: Internal Medicine | Admitting: Internal Medicine

## 2020-10-21 DIAGNOSIS — Z923 Personal history of irradiation: Secondary | ICD-10-CM

## 2020-10-21 DIAGNOSIS — K449 Diaphragmatic hernia without obstruction or gangrene: Secondary | ICD-10-CM | POA: Diagnosis present

## 2020-10-21 DIAGNOSIS — E669 Obesity, unspecified: Secondary | ICD-10-CM | POA: Diagnosis present

## 2020-10-21 DIAGNOSIS — I4891 Unspecified atrial fibrillation: Secondary | ICD-10-CM | POA: Diagnosis not present

## 2020-10-21 DIAGNOSIS — K5790 Diverticulosis of intestine, part unspecified, without perforation or abscess without bleeding: Secondary | ICD-10-CM | POA: Diagnosis not present

## 2020-10-21 DIAGNOSIS — N320 Bladder-neck obstruction: Secondary | ICD-10-CM | POA: Diagnosis not present

## 2020-10-21 DIAGNOSIS — D500 Iron deficiency anemia secondary to blood loss (chronic): Secondary | ICD-10-CM | POA: Diagnosis not present

## 2020-10-21 DIAGNOSIS — Z87891 Personal history of nicotine dependence: Secondary | ICD-10-CM

## 2020-10-21 DIAGNOSIS — R531 Weakness: Secondary | ICD-10-CM | POA: Diagnosis not present

## 2020-10-21 DIAGNOSIS — E039 Hypothyroidism, unspecified: Secondary | ICD-10-CM | POA: Diagnosis present

## 2020-10-21 DIAGNOSIS — M1990 Unspecified osteoarthritis, unspecified site: Secondary | ICD-10-CM

## 2020-10-21 DIAGNOSIS — R634 Abnormal weight loss: Secondary | ICD-10-CM | POA: Diagnosis present

## 2020-10-21 DIAGNOSIS — E861 Hypovolemia: Secondary | ICD-10-CM | POA: Diagnosis present

## 2020-10-21 DIAGNOSIS — N1832 Chronic kidney disease, stage 3b: Secondary | ICD-10-CM | POA: Diagnosis present

## 2020-10-21 DIAGNOSIS — R52 Pain, unspecified: Secondary | ICD-10-CM

## 2020-10-21 DIAGNOSIS — Z902 Acquired absence of lung [part of]: Secondary | ICD-10-CM

## 2020-10-21 DIAGNOSIS — Z6832 Body mass index (BMI) 32.0-32.9, adult: Secondary | ICD-10-CM | POA: Diagnosis not present

## 2020-10-21 DIAGNOSIS — R778 Other specified abnormalities of plasma proteins: Secondary | ICD-10-CM | POA: Diagnosis present

## 2020-10-21 DIAGNOSIS — J90 Pleural effusion, not elsewhere classified: Secondary | ICD-10-CM | POA: Diagnosis not present

## 2020-10-21 DIAGNOSIS — N1831 Chronic kidney disease, stage 3a: Secondary | ICD-10-CM | POA: Diagnosis not present

## 2020-10-21 DIAGNOSIS — M25461 Effusion, right knee: Secondary | ICD-10-CM | POA: Diagnosis not present

## 2020-10-21 DIAGNOSIS — N179 Acute kidney failure, unspecified: Principal | ICD-10-CM | POA: Diagnosis present

## 2020-10-21 DIAGNOSIS — J3489 Other specified disorders of nose and nasal sinuses: Secondary | ICD-10-CM | POA: Diagnosis not present

## 2020-10-21 DIAGNOSIS — B370 Candidal stomatitis: Secondary | ICD-10-CM | POA: Diagnosis present

## 2020-10-21 DIAGNOSIS — Z888 Allergy status to other drugs, medicaments and biological substances status: Secondary | ICD-10-CM

## 2020-10-21 DIAGNOSIS — R630 Anorexia: Secondary | ICD-10-CM | POA: Diagnosis not present

## 2020-10-21 DIAGNOSIS — I129 Hypertensive chronic kidney disease with stage 1 through stage 4 chronic kidney disease, or unspecified chronic kidney disease: Secondary | ICD-10-CM | POA: Diagnosis present

## 2020-10-21 DIAGNOSIS — M6281 Muscle weakness (generalized): Secondary | ICD-10-CM | POA: Diagnosis present

## 2020-10-21 DIAGNOSIS — E871 Hypo-osmolality and hyponatremia: Secondary | ICD-10-CM | POA: Diagnosis not present

## 2020-10-21 DIAGNOSIS — Z6830 Body mass index (BMI) 30.0-30.9, adult: Secondary | ICD-10-CM

## 2020-10-21 DIAGNOSIS — K2970 Gastritis, unspecified, without bleeding: Secondary | ICD-10-CM | POA: Diagnosis present

## 2020-10-21 DIAGNOSIS — Z79899 Other long term (current) drug therapy: Secondary | ICD-10-CM

## 2020-10-21 DIAGNOSIS — H9190 Unspecified hearing loss, unspecified ear: Secondary | ICD-10-CM | POA: Diagnosis present

## 2020-10-21 DIAGNOSIS — Z79890 Hormone replacement therapy (postmenopausal): Secondary | ICD-10-CM | POA: Diagnosis not present

## 2020-10-21 DIAGNOSIS — D638 Anemia in other chronic diseases classified elsewhere: Secondary | ICD-10-CM | POA: Diagnosis present

## 2020-10-21 DIAGNOSIS — D696 Thrombocytopenia, unspecified: Secondary | ICD-10-CM | POA: Diagnosis present

## 2020-10-21 DIAGNOSIS — T451X5A Adverse effect of antineoplastic and immunosuppressive drugs, initial encounter: Secondary | ICD-10-CM | POA: Diagnosis present

## 2020-10-21 DIAGNOSIS — J449 Chronic obstructive pulmonary disease, unspecified: Secondary | ICD-10-CM | POA: Diagnosis present

## 2020-10-21 DIAGNOSIS — Z20822 Contact with and (suspected) exposure to covid-19: Secondary | ICD-10-CM | POA: Diagnosis present

## 2020-10-21 DIAGNOSIS — R4020 Unspecified coma: Secondary | ICD-10-CM | POA: Diagnosis not present

## 2020-10-21 DIAGNOSIS — M1712 Unilateral primary osteoarthritis, left knee: Secondary | ICD-10-CM | POA: Diagnosis not present

## 2020-10-21 DIAGNOSIS — D631 Anemia in chronic kidney disease: Secondary | ICD-10-CM | POA: Diagnosis present

## 2020-10-21 DIAGNOSIS — K5730 Diverticulosis of large intestine without perforation or abscess without bleeding: Secondary | ICD-10-CM | POA: Diagnosis present

## 2020-10-21 DIAGNOSIS — D539 Nutritional anemia, unspecified: Secondary | ICD-10-CM

## 2020-10-21 DIAGNOSIS — B9681 Helicobacter pylori [H. pylori] as the cause of diseases classified elsewhere: Secondary | ICD-10-CM | POA: Diagnosis present

## 2020-10-21 DIAGNOSIS — D649 Anemia, unspecified: Secondary | ICD-10-CM | POA: Diagnosis not present

## 2020-10-21 DIAGNOSIS — R140 Abdominal distension (gaseous): Secondary | ICD-10-CM | POA: Diagnosis not present

## 2020-10-21 DIAGNOSIS — K3189 Other diseases of stomach and duodenum: Secondary | ICD-10-CM | POA: Diagnosis not present

## 2020-10-21 DIAGNOSIS — E222 Syndrome of inappropriate secretion of antidiuretic hormone: Secondary | ICD-10-CM | POA: Diagnosis present

## 2020-10-21 DIAGNOSIS — G620 Drug-induced polyneuropathy: Secondary | ICD-10-CM | POA: Diagnosis present

## 2020-10-21 DIAGNOSIS — E440 Moderate protein-calorie malnutrition: Secondary | ICD-10-CM | POA: Diagnosis present

## 2020-10-21 DIAGNOSIS — Z853 Personal history of malignant neoplasm of breast: Secondary | ICD-10-CM | POA: Diagnosis not present

## 2020-10-21 DIAGNOSIS — R0602 Shortness of breath: Secondary | ICD-10-CM

## 2020-10-21 DIAGNOSIS — Z85118 Personal history of other malignant neoplasm of bronchus and lung: Secondary | ICD-10-CM

## 2020-10-21 DIAGNOSIS — K319 Disease of stomach and duodenum, unspecified: Secondary | ICD-10-CM | POA: Diagnosis present

## 2020-10-21 DIAGNOSIS — Q399 Congenital malformation of esophagus, unspecified: Secondary | ICD-10-CM | POA: Diagnosis not present

## 2020-10-21 DIAGNOSIS — G9341 Metabolic encephalopathy: Secondary | ICD-10-CM | POA: Diagnosis present

## 2020-10-21 DIAGNOSIS — E860 Dehydration: Secondary | ICD-10-CM | POA: Diagnosis present

## 2020-10-21 DIAGNOSIS — G8929 Other chronic pain: Secondary | ICD-10-CM | POA: Diagnosis present

## 2020-10-21 DIAGNOSIS — M109 Gout, unspecified: Secondary | ICD-10-CM | POA: Diagnosis not present

## 2020-10-21 DIAGNOSIS — M25462 Effusion, left knee: Secondary | ICD-10-CM | POA: Diagnosis present

## 2020-10-21 DIAGNOSIS — I714 Abdominal aortic aneurysm, without rupture: Secondary | ICD-10-CM | POA: Diagnosis present

## 2020-10-21 DIAGNOSIS — R0989 Other specified symptoms and signs involving the circulatory and respiratory systems: Secondary | ICD-10-CM | POA: Diagnosis present

## 2020-10-21 DIAGNOSIS — R339 Retention of urine, unspecified: Secondary | ICD-10-CM | POA: Diagnosis not present

## 2020-10-21 DIAGNOSIS — D61818 Other pancytopenia: Secondary | ICD-10-CM | POA: Diagnosis not present

## 2020-10-21 DIAGNOSIS — I480 Paroxysmal atrial fibrillation: Secondary | ICD-10-CM | POA: Diagnosis present

## 2020-10-21 DIAGNOSIS — M1711 Unilateral primary osteoarthritis, right knee: Secondary | ICD-10-CM | POA: Diagnosis not present

## 2020-10-21 DIAGNOSIS — Z886 Allergy status to analgesic agent status: Secondary | ICD-10-CM

## 2020-10-21 DIAGNOSIS — G252 Other specified forms of tremor: Secondary | ICD-10-CM | POA: Diagnosis present

## 2020-10-21 DIAGNOSIS — I10 Essential (primary) hypertension: Secondary | ICD-10-CM

## 2020-10-21 DIAGNOSIS — Z809 Family history of malignant neoplasm, unspecified: Secondary | ICD-10-CM

## 2020-10-21 DIAGNOSIS — M170 Bilateral primary osteoarthritis of knee: Secondary | ICD-10-CM | POA: Diagnosis present

## 2020-10-21 DIAGNOSIS — K2950 Unspecified chronic gastritis without bleeding: Secondary | ICD-10-CM | POA: Diagnosis not present

## 2020-10-21 DIAGNOSIS — R627 Adult failure to thrive: Secondary | ICD-10-CM | POA: Diagnosis not present

## 2020-10-21 DIAGNOSIS — Z8249 Family history of ischemic heart disease and other diseases of the circulatory system: Secondary | ICD-10-CM

## 2020-10-21 DIAGNOSIS — R5381 Other malaise: Secondary | ICD-10-CM | POA: Diagnosis not present

## 2020-10-21 DIAGNOSIS — J9601 Acute respiratory failure with hypoxia: Secondary | ICD-10-CM | POA: Diagnosis not present

## 2020-10-21 DIAGNOSIS — E559 Vitamin D deficiency, unspecified: Secondary | ICD-10-CM | POA: Diagnosis not present

## 2020-10-21 DIAGNOSIS — Z7951 Long term (current) use of inhaled steroids: Secondary | ICD-10-CM

## 2020-10-21 DIAGNOSIS — J323 Chronic sphenoidal sinusitis: Secondary | ICD-10-CM | POA: Diagnosis not present

## 2020-10-21 DIAGNOSIS — Z9114 Patient's other noncompliance with medication regimen: Secondary | ICD-10-CM

## 2020-10-21 LAB — CBG MONITORING, ED
Glucose-Capillary: 125 mg/dL — ABNORMAL HIGH (ref 70–99)
Glucose-Capillary: 111 mg/dL — ABNORMAL HIGH (ref 70–99)

## 2020-10-21 LAB — CBC WITH DIFFERENTIAL/PLATELET
Abs Immature Granulocytes: 0.14 10*3/uL — ABNORMAL HIGH (ref 0.00–0.07)
Basophils Absolute: 0 10*3/uL (ref 0.0–0.1)
Basophils Relative: 0 %
Eosinophils Absolute: 0 10*3/uL (ref 0.0–0.5)
Eosinophils Relative: 0 %
HCT: 35.5 % — ABNORMAL LOW (ref 36.0–46.0)
Hemoglobin: 10.9 g/dL — ABNORMAL LOW (ref 12.0–15.0)
Immature Granulocytes: 2 %
Lymphocytes Relative: 13 %
Lymphs Abs: 1.3 10*3/uL (ref 0.7–4.0)
MCH: 28.8 pg (ref 26.0–34.0)
MCHC: 30.7 g/dL (ref 30.0–36.0)
MCV: 93.9 fL (ref 80.0–100.0)
Monocytes Absolute: 6.4 10*3/uL — ABNORMAL HIGH (ref 0.1–1.0)
Monocytes Relative: 66 %
Neutro Abs: 1.9 10*3/uL (ref 1.7–7.7)
Neutrophils Relative %: 19 %
Platelets: 92 10*3/uL — ABNORMAL LOW (ref 150–400)
RBC: 3.78 MIL/uL — ABNORMAL LOW (ref 3.87–5.11)
RDW: 15.4 % (ref 11.5–15.5)
WBC: 9.7 10*3/uL (ref 4.0–10.5)
nRBC: 0.4 % — ABNORMAL HIGH (ref 0.0–0.2)

## 2020-10-21 LAB — HEPATIC FUNCTION PANEL
ALT: 13 U/L (ref 0–44)
AST: 20 U/L (ref 15–41)
Albumin: 3.3 g/dL — ABNORMAL LOW (ref 3.5–5.0)
Alkaline Phosphatase: 36 U/L — ABNORMAL LOW (ref 38–126)
Bilirubin, Direct: 0.3 mg/dL — ABNORMAL HIGH (ref 0.0–0.2)
Indirect Bilirubin: 1.2 mg/dL — ABNORMAL HIGH (ref 0.3–0.9)
Total Bilirubin: 1.5 mg/dL — ABNORMAL HIGH (ref 0.3–1.2)
Total Protein: 8.1 g/dL (ref 6.5–8.1)

## 2020-10-21 LAB — CBC
HCT: 35.6 % — ABNORMAL LOW (ref 36.0–46.0)
Hemoglobin: 10.9 g/dL — ABNORMAL LOW (ref 12.0–15.0)
MCH: 28.5 pg (ref 26.0–34.0)
MCHC: 30.6 g/dL (ref 30.0–36.0)
MCV: 93.2 fL (ref 80.0–100.0)
Platelets: 89 10*3/uL — ABNORMAL LOW (ref 150–400)
RBC: 3.82 MIL/uL — ABNORMAL LOW (ref 3.87–5.11)
RDW: 15.5 % (ref 11.5–15.5)
WBC: 9.5 10*3/uL (ref 4.0–10.5)
nRBC: 0.4 % — ABNORMAL HIGH (ref 0.0–0.2)

## 2020-10-21 LAB — BASIC METABOLIC PANEL
Anion gap: 13 (ref 5–15)
Anion gap: 10 (ref 5–15)
BUN: 30 mg/dL — ABNORMAL HIGH (ref 8–23)
BUN: 27 mg/dL — ABNORMAL HIGH (ref 8–23)
CO2: 21 mmol/L — ABNORMAL LOW (ref 22–32)
CO2: 22 mmol/L (ref 22–32)
Calcium: 9.4 mg/dL (ref 8.9–10.3)
Calcium: 8.9 mg/dL (ref 8.9–10.3)
Chloride: 88 mmol/L — ABNORMAL LOW (ref 98–111)
Chloride: 90 mmol/L — ABNORMAL LOW (ref 98–111)
Creatinine, Ser: 1.53 mg/dL — ABNORMAL HIGH (ref 0.44–1.00)
Creatinine, Ser: 1.17 mg/dL — ABNORMAL HIGH (ref 0.44–1.00)
GFR, Estimated: 33 mL/min — ABNORMAL LOW (ref >60)
GFR, Estimated: 46 mL/min — ABNORMAL LOW (ref >60)
Glucose, Bld: 121 mg/dL — ABNORMAL HIGH (ref 70–99)
Glucose, Bld: 110 mg/dL — ABNORMAL HIGH (ref 70–99)
Potassium: 3.8 mmol/L (ref 3.5–5.1)
Potassium: 3.7 mmol/L (ref 3.5–5.1)
Sodium: 122 mmol/L — ABNORMAL LOW (ref 135–145)
Sodium: 122 mmol/L — ABNORMAL LOW (ref 135–145)

## 2020-10-21 LAB — URINALYSIS, ROUTINE W REFLEX MICROSCOPIC
Bilirubin Urine: NEGATIVE
Glucose, UA: NEGATIVE mg/dL
Hgb urine dipstick: NEGATIVE
Ketones, ur: NEGATIVE mg/dL
Nitrite: NEGATIVE
Protein, ur: NEGATIVE mg/dL
Specific Gravity, Urine: 1.012 (ref 1.005–1.030)
pH: 5 (ref 5.0–8.0)

## 2020-10-21 LAB — RESP PANEL BY RT-PCR (FLU A&B, COVID) ARPGX2
Influenza A by PCR: NEGATIVE
Influenza B by PCR: NEGATIVE
SARS Coronavirus 2 by RT PCR: NEGATIVE

## 2020-10-21 LAB — TROPONIN I (HIGH SENSITIVITY)
Troponin I (High Sensitivity): 41 ng/L — ABNORMAL HIGH (ref <18)
Troponin I (High Sensitivity): 35 ng/L — ABNORMAL HIGH (ref <18)

## 2020-10-21 LAB — URIC ACID: Uric Acid, Serum: 9.5 mg/dL — ABNORMAL HIGH (ref 2.5–7.1)

## 2020-10-21 MED ORDER — ACETAMINOPHEN 500 MG PO TABS
1000.0000 mg | ORAL_TABLET | Freq: Once | ORAL | Status: AC
  Administered 2020-10-21: 17:00:00 1000 mg via ORAL
  Filled 2020-10-21: qty 2

## 2020-10-21 MED ORDER — ADULT MULTIVITAMIN W/MINERALS CH
1.0000 | ORAL_TABLET | Freq: Every day | ORAL | Status: DC
  Administered 2020-10-22 – 2020-10-30 (×9): 1 via ORAL
  Filled 2020-10-21 (×9): qty 1

## 2020-10-21 MED ORDER — SODIUM CHLORIDE 0.9 % IV SOLN: INTRAVENOUS | Status: DC

## 2020-10-21 MED ORDER — FLUTICASONE-UMECLIDIN-VILANT 100-62.5-25 MCG/INH IN AEPB: 1.0000 | INHALATION_SPRAY | Freq: Every day | RESPIRATORY_TRACT | Status: DC

## 2020-10-21 MED ORDER — ALBUTEROL 90 MCG/ACT IN AERS: 2.0000 | INHALATION_SPRAY | Freq: Four times a day (QID) | RESPIRATORY_TRACT | Status: DC | PRN

## 2020-10-21 MED ORDER — ALBUTEROL SULFATE HFA 108 (90 BASE) MCG/ACT IN AERS
2.0000 | INHALATION_SPRAY | Freq: Four times a day (QID) | RESPIRATORY_TRACT | Status: DC | PRN
  Administered 2020-10-26: 2 via RESPIRATORY_TRACT
  Filled 2020-10-21: qty 6.7

## 2020-10-21 MED ORDER — SODIUM CHLORIDE 0.9% FLUSH
3.0000 mL | Freq: Two times a day (BID) | INTRAVENOUS | Status: DC
  Administered 2020-10-22 – 2020-10-30 (×17): 3 mL via INTRAVENOUS

## 2020-10-21 MED ORDER — ENOXAPARIN SODIUM 30 MG/0.3ML ~~LOC~~ SOLN: 30.0000 mg | Freq: Every day | SUBCUTANEOUS | Status: DC

## 2020-10-21 MED ORDER — UMECLIDINIUM BROMIDE 62.5 MCG/INH IN AEPB
1.0000 | INHALATION_SPRAY | Freq: Every day | RESPIRATORY_TRACT | Status: DC
  Administered 2020-10-23 – 2020-10-30 (×5): 1 via RESPIRATORY_TRACT
  Filled 2020-10-21: qty 7

## 2020-10-21 MED ORDER — METOPROLOL SUCCINATE ER 50 MG PO TB24
50.0000 mg | ORAL_TABLET | Freq: Every day | ORAL | Status: DC
  Administered 2020-10-21 – 2020-10-23 (×3): 50 mg via ORAL
  Filled 2020-10-21: qty 1
  Filled 2020-10-21: qty 2
  Filled 2020-10-21 (×2): qty 1
  Filled 2020-10-21: qty 2

## 2020-10-21 MED ORDER — ACETAMINOPHEN 325 MG PO TABS: 650.0000 mg | ORAL_TABLET | ORAL | Status: DC | PRN

## 2020-10-21 MED ORDER — ONDANSETRON HCL 4 MG/2ML IJ SOLN: 4.0000 mg | Freq: Four times a day (QID) | INTRAMUSCULAR | Status: DC | PRN

## 2020-10-21 MED ORDER — LEVOTHYROXINE SODIUM 88 MCG PO TABS
88.0000 ug | ORAL_TABLET | Freq: Every day | ORAL | Status: DC
  Administered 2020-10-22 – 2020-10-25 (×3): 88 ug via ORAL
  Filled 2020-10-21 (×4): qty 1

## 2020-10-21 MED ORDER — FERROUS SULFATE 325 (65 FE) MG PO TABS
325.0000 mg | ORAL_TABLET | Freq: Every day | ORAL | Status: DC
  Administered 2020-10-22 – 2020-10-25 (×4): 325 mg via ORAL
  Filled 2020-10-21 (×4): qty 1

## 2020-10-21 MED ORDER — FLUTICASONE FUROATE-VILANTEROL 100-25 MCG/INH IN AEPB
1.0000 | INHALATION_SPRAY | Freq: Every day | RESPIRATORY_TRACT | Status: DC
  Administered 2020-10-23 – 2020-10-27 (×4): 1 via RESPIRATORY_TRACT
  Filled 2020-10-21: qty 28

## 2020-10-21 MED ORDER — ONDANSETRON HCL 4 MG PO TABS: 4.0000 mg | ORAL_TABLET | Freq: Four times a day (QID) | ORAL | Status: DC | PRN

## 2020-10-21 MED ORDER — SODIUM CHLORIDE 0.9 % IV BOLUS
500.0000 mL | Freq: Once | INTRAVENOUS | Status: AC
  Administered 2020-10-21: 16:00:00 500 mL via INTRAVENOUS

## 2020-10-21 MED ORDER — LEVOTHYROXINE SODIUM 88 MCG PO TABS: 88.0000 ug | ORAL_TABLET | Freq: Every day | ORAL | Status: DC

## 2020-10-21 NOTE — Assessment & Plan Note (Signed)
-  Unintentional and approximately 20 pounds per grandson and patient -Ongoing since approximately October despite patient trying to eat -She will require IV fluids due to inability to maintain adequate nutrition orally -RD consult -GI consulted per family request; last CLN 2004 (polyps, diverticulosis; no EGD prior)

## 2020-10-21 NOTE — Assessment & Plan Note (Signed)
-  Has been on chronic meloxicam for years to control chronic pain.  She appears to have osteoarthritis to me rather than a gout flare which is what she is worried about -Hold meloxicam given renal failure -Check uric acid level.  Has never been tried on allopurinol according to her grandson

## 2020-10-21 NOTE — ED Provider Notes (Signed)
[facility] EMERGENCY DEPARTMENT Provider Note   CSN: [id number] Arrival date & time: 10/21/20  1054     History Chief Complaint  Patient presents with   Weakness    Lisa [name] is a 86 y.o. female presented for evaluation of weakness.  Patient request history of be provided by grandson, who lives most of the history.  He states patient has had significant worsening weakness over the past several weeks, now to the point where she is unable to walk around her own house.  Normally is ambulatory without needing assistance.  She has had decreased p.o. intake, is only tolerating yogurt.  Recently found to have an AKI, thought to be due to medications including meloxicam and HCTZ, these were stopped.  She was recently on a course of prednisone for what was presumed to be polymyalgia rheumatica, is working to get in with her rheumatologist.  Steroid course ended 2 days ago.  Grandson brought patient in today as she continues to be weak and has had increasing episodes of confusion.  This is worse at night.  She will not know where she is or what year it is.  Additionally, he states she has had intermittent episodes of low blood pressure, systolic in the 15B.  This has been true even since discontinuing BP meds.  Additional history taken chart review.  Patient with a history of breast and lung cancer status post radiation and lumpectomy.  History of asthma, hypothyroidism, hypertension. Presumed dx of MDS, but not confirmed with biopsy yet.   HPI     Past Medical History:  Diagnosis Date   Adenocarcinoma of left lung ([place]) 2006   Arthritis    Asthma    Cancer of breast, female [facility])    Cancer of lung ([place])    Hypertension    Hypothyroidism    Invasive ductal carcinoma of left breast ([place]) 1999   Neutropenia ([place]) 06/09/2016   Personal history of radiation therapy     Patient Active Problem List   Diagnosis Date Noted   Acute renal failure superimposed on  stage 3b chronic kidney disease ([place]) 10/21/2020   Gout 10/21/2020   Hypothyroidism 10/21/2020   HTN (hypertension) 10/21/2020   Weight loss 10/21/2020   Generalized muscle weakness 10/21/2020   Osteoarthritis 10/21/2020   Atrial fibrillation with RVR ([place]) 10/21/2020   Other pancytopenia ([place]) 03/13/2019   Acute respiratory failure with hypoxia ([place]) 05/31/2017   Hyperglycemia 05/31/2017   CKD (chronic kidney disease) stage 3, GFR 30-59 ml/min (HCC) 05/31/2017   Normocytic anemia 05/31/2017   COPD exacerbation ([place]) 05/31/2017   AAA (abdominal aortic aneurysm) ([place]) 05/31/2017   Urinary incontinence 05/31/2017   Vitamin D deficiency 06/10/2016   Neutropenia ([place]) 06/09/2016   Invasive ductal carcinoma of left breast ([place]) 06/09/2016   Adenocarcinoma of left lung ([place]) 06/09/2016   Adenomatous polyps 02/08/2013    Past Surgical History:  Procedure Laterality Date   BREAST BIOPSY     left axillary node dissection   BREAST LUMPECTOMY Left    COLONOSCOPY  03/08/2003   [id number] rectal polyps destroyed with the tip of the snare/Polyps at hepatic flexure, splenic flexure at 35 cm/Left-sided diverticula: unable to retrieve path   COLONOSCOPY  09/12/2008   AGT:XMIWOE rectum and distal sigmoid diminutive polyps/scattered left sided diverticulum. hyperplastic   COLONOSCOPY N/A 03/06/2013   HOZ:[id number] polyp-removed as described above; colonic diverticulosis. hyperplastic polyps. next TCS 03/2018   FOOT SURGERY     LUNG REMOVAL, PARTIAL  upper lobe     OB History   No obstetric history on file.     Family History  Problem Relation Age of Onset   Cancer Mother    Cancer Sister    Colon cancer Neg Hx     Social History   Tobacco Use   Smoking status: Former Smoker    Packs/day: 0.75    Years: 35.00    Pack years: 26.25    Quit date: 1993    Years since quitting: 28.9   Smokeless tobacco: Never Used   Tobacco comment: smoke-free X  30 yeras  Vaping Use   Vaping Use: Never used  Substance Use Topics   Alcohol use: No   Drug use: No    Home Medications Prior to Admission medications   Medication Sig Start Date End Date Taking? Authorizing Provider  clobetasol ointment (TEMOVATE) 0.63 % Apply 1 application topically as needed (for irritation). 02/14/19  Yes [provider]  Iron-FA-B Cmp-C-Biot-Probiotic (FUSION PLUS) CAPS Take 1 capsule by mouth daily.   Yes [provider]  albuterol (PROVENTIL,VENTOLIN) 90 MCG/ACT inhaler Inhale 2 puffs into the lungs every 6 (six) hours as needed for wheezing or shortness of breath.    Everardo All, MD  ferrous sulfate 325 (65 FE) MG tablet Take 325 mg by mouth daily with breakfast.    [provider]  levothyroxine (SYNTHROID, LEVOTHROID) 88 MCG tablet Take 88 mcg by mouth daily before breakfast.    [provider]  metoprolol succinate (TOPROL-XL) 50 MG 24 hr tablet Take 50 mg by mouth daily. Take with or immediately following a meal.    [provider]  Multiple Vitamin (MULTIVITAMIN WITH MINERALS) TABS tablet Take 1 tablet by mouth daily.    [provider]  TRELEGY ELLIPTA 100-62.5-25 MCG/INH AEPB Inhale 1 puff into the lungs daily.  01/04/19   [provider]    Allergies    Meloxicam and Micardis hct [telmisartan-hctz]  Review of Systems   Review of Systems  Neurological: Positive for weakness.  Psychiatric/Behavioral: Positive for confusion.  All other systems reviewed and are negative.   Physical Exam Updated Vital Signs BP (!) 144/65    Pulse (!) 108    Temp 98.4 F (36.9 C) (Oral)    Resp (!) 25    Ht 5\' 7"  (1.702 m)    Wt 86.6 kg    SpO2 98%    BMI 29.91 kg/m   Physical Exam Vitals and nursing note reviewed.  Constitutional:      General: She is not in acute distress.    Appearance: She is well-developed and well-nourished.     Comments: Appears nontoxic  HENT:     Head: Normocephalic and  atraumatic.  Eyes:     Extraocular Movements: Extraocular movements intact and EOM normal.     Conjunctiva/sclera: Conjunctivae normal.     Pupils: Pupils are equal, round, and reactive to light.  Cardiovascular:     Rate and Rhythm: Tachycardia present. Rhythm irregular.     Pulses: Intact distal pulses.     Comments: Irregularly irregular heart rate between 95 and 110 Pulmonary:     Effort: Pulmonary effort is normal. No respiratory distress.     Breath sounds: Normal breath sounds. No wheezing.  Abdominal:     General: There is no distension.     Palpations: Abdomen is soft. There is no mass.     Tenderness: There is no abdominal tenderness. There is no guarding or  rebound.  Musculoskeletal:     Cervical back: Normal range of motion and neck supple.     Comments: Weakness of bilateral lower extremities.  Pedal pulses 2+ bilaterally.  No significant edema.  Skin:    General: Skin is warm and dry.     Capillary Refill: Capillary refill takes less than 2 seconds.  Neurological:     Mental Status: She is alert and oriented to person, place, and time.     GCS: GCS eye subscore is 4. GCS verbal subscore is 5. GCS motor subscore is 6.     Cranial Nerves: Cranial nerves are intact.     Sensory: Sensation is intact.     Motor: Weakness present.     Coordination: Finger-Nose-Finger Test normal.     Comments: A&O x3.  GCS of 15.  CN intact.  Nose to finger intact.  Patient unable to lift legs off the bed due to weakness, equal bilaterally.  Psychiatric:        Mood and Affect: Mood and affect normal.     ED Results / Procedures / Treatments   Labs (all labs ordered are listed, but only abnormal results are displayed) Labs Reviewed  BASIC METABOLIC PANEL - Abnormal; Notable for the following components:      Result Value   Sodium 122 (*)    Chloride 88 (*)    CO2 21 (*)    Glucose, Bld 121 (*)    BUN 30 (*)    Creatinine, Ser 1.53 (*)    GFR, Estimated 33 (*)    All other  components within normal limits  CBC - Abnormal; Notable for the following components:   RBC 3.82 (*)    Hemoglobin 10.9 (*)    HCT 35.6 (*)    Platelets 89 (*)    nRBC 0.4 (*)    All other components within normal limits  URINALYSIS, ROUTINE W REFLEX MICROSCOPIC - Abnormal; Notable for the following components:   APPearance CLOUDY (*)    Leukocytes,Ua SMALL (*)    Bacteria, UA RARE (*)    All other components within normal limits  HEPATIC FUNCTION PANEL - Abnormal; Notable for the following components:   Albumin 3.3 (*)    Alkaline Phosphatase 36 (*)    Total Bilirubin 1.5 (*)    Bilirubin, Direct 0.3 (*)    Indirect Bilirubin 1.2 (*)    All other components within normal limits  CBC WITH DIFFERENTIAL/PLATELET - Abnormal; Notable for the following components:   RBC 3.78 (*)    Hemoglobin 10.9 (*)    HCT 35.5 (*)    Platelets 92 (*)    nRBC 0.4 (*)    Monocytes Absolute 6.4 (*)    Abs Immature Granulocytes 0.14 (*)    All other components within normal limits  CBG MONITORING, ED - Abnormal; Notable for the following components:   Glucose-Capillary 125 (*)    All other components within normal limits  CBG MONITORING, ED - Abnormal; Notable for the following components:   Glucose-Capillary 111 (*)    All other components within normal limits  RESP PANEL BY RT-PCR (FLU A&B, COVID) ARPGX2  URINE CULTURE  PATHOLOGIST SMEAR REVIEW  URIC ACID  BASIC METABOLIC PANEL  CBC WITH DIFFERENTIAL/PLATELET  MAGNESIUM  BASIC METABOLIC PANEL  TSH  TROPONIN I (HIGH SENSITIVITY)    EKG EKG Interpretation  Date/Time:  Monday October 21 2020 14:26:15 EST Ventricular Rate:  123 PR Interval:    QRS Duration: 126 QT Interval:  272 QTC Calculation: 389 R Axis:   65 Text Interpretation: Atrial fibrillation LVH with secondary repolarization abnormality ST depr, consider ischemia, inferior leads Minimal ST elevation, lateral leads Confirmed by Thamas Jaegers ([id number]) on 10/21/2020 2:30:24 PM  Also confirmed by Thamas Jaegers ([id number]), editor Hattie Perch [phone])  on 10/21/2020 2:31:57 PM   Radiology DG Chest 2 View  Result Date: 10/21/2020 CLINICAL DATA:  Weakness EXAM: CHEST - 2 VIEW COMPARISON:  05/31/2017 FINDINGS: Cardiac shadow is stable. Aortic calcifications are noted. The lungs are well aerated bilaterally. No focal infiltrate or sizable effusion is seen. No bony abnormality is noted. IMPRESSION: No acute abnormality noted. Electronically Signed   By: Inez Catalina M.D.   On: 10/21/2020 13:59   CT Head Wo Contrast  Result Date: 10/21/2020 CLINICAL DATA:  Altered level of consciousness EXAM: CT HEAD WITHOUT CONTRAST TECHNIQUE: Contiguous axial images were obtained from the base of the skull through the vertex without intravenous contrast. COMPARISON:  06/04/2005 FINDINGS: Brain: There are confluent hypodensities throughout the periventricular white matter, more pronounced since prior study, consistent with age-indeterminate small vessel ischemic changes. These are likely chronic. No other signs of acute infarct or hemorrhage. Lateral ventricles and remaining midline structures are unremarkable. No acute extra-axial fluid collections. No mass effect. Vascular: No hyperdense vessel or unexpected calcification. Skull: Normal. Negative for fracture or focal lesion. Sinuses/Orbits: Minimal mucosal thickening within the sphenoid sinus. Remaining sinuses are clear. Other: None. IMPRESSION: 1. Diffuse hypodensities throughout the periventricular white matter, likely representing chronic small vessel ischemic changes. 2. Otherwise no acute intracranial process. Electronically Signed   By: Randa Ngo M.D.   On: 10/21/2020 15:01    Procedures Procedures (including critical care time)  Medications Ordered in ED Medications  0.9 %  sodium chloride infusion ( Intravenous New Bag/Given 10/21/20 1901)  metoprolol succinate (TOPROL-XL) 24 hr tablet 50 mg (50 mg Oral Given 10/21/20 1900)   sodium chloride 0.9 % bolus 500 mL (0 mLs Intravenous Stopped 10/21/20 1646)  acetaminophen (TYLENOL) tablet 1,000 mg (1,000 mg Oral Given 10/21/20 1645)    ED Course  I have reviewed the triage vital signs and the nursing notes.  Pertinent labs & imaging results that were available during my care of the patient were reviewed by me and considered in my medical decision making (see chart for details).    MDM Rules/Calculators/A&P     CHA2DS2-VASc Score: 4                     Patient presented for evaluation of weakness.  On exam, she appears nontoxic.  She does have bilateral lower leg weakness, no unilateral or focal deficits to indicate a stroke.  She has had a gradual decline over several weeks, seems it has worsened recently.  Increased confusion and hypotension at home per her grandson.  Labs obtained in triage interpreted by me, shows mild hyponatremia, likely dehydrated in the setting of decreased p.o. intake.  Hemoglobin stable.  Creatinine close to baseline, per grandson improved from last week.  We will add on urine and chest x-ray to assess for weakness.  EKG ordered.  We will add on CT head due to weakness and confusion.  CT head negative.  Chest x-ray viewed interpreted by me, no pneumonia conversant effusion.  Urine without obvious signs of infection, there are some leuks, however there also lots of squamous cells.  Culture sent.  EKG shows patient is in A. fib.  No history of the same.  She  is at high risk for stroke for this.  CHA2DS2-VASc score of 4.  I discussed anticoagulation with patient and grandson, they would like to hold off at this time.  As patient's platelets are low, although chronically, will hold on anticoagulation.  Patient will need to be admitted due to weakness, dehydration/hyponatremia, and new onset A. fib.  Discussed with Dr. Sabino Gasser from [facility] hospitalist service, pt to be admitted.   Final Clinical Impression(s) / ED Diagnoses Final diagnoses:   Hyponatremia  Weakness  Atrial fibrillation, unspecified type [facility])    Rx / DC Orders ED Discharge Orders    None       Franchot Heidelberg, PA-C 10/21/20 1911    Luna Fuse, MD 10/22/20 [phone]

## 2020-10-21 NOTE — Assessment & Plan Note (Signed)
-  Check TSH -Continue home Synthroid.  She has not taken for about 5 days because she thought she was supposed to hold all her medications

## 2020-10-21 NOTE — Assessment & Plan Note (Signed)
-  Almost mildly orthostatic in the ER but asymptomatic -Hold home meds for now -If becomes elevated will resume alternative agent while kidney function recovers

## 2020-10-21 NOTE — H&P (Signed)
History and Physical    Zaeda Mcferran  OZH:[id number]  DOB: 1934-06-15  DOA: 10/21/2020  PCP: Celene Squibb, MD Patient coming from: home  Chief Complaint: weakness, nausea, weight loss, decreased oral intake  HPI:  Ms Althoff is an 86 yo female with PMH breast cancer, lung cancer, hypothyroidism, gout who presents to the ER with progressively worsening weakness notably in her left lower extremity.  She has been more dependent on assistive devices at home for ambulation.  She states that she has been nauseous and not eating much for approximately 2 months with associated unintentional weight loss.  She endorses early satiety with eating and has been unable to maintain her weight. She has been followed closely outpatient by hematology recently for a pancytopenia work-up with consideration for a bone marrow biopsy to further investigate for possible MDS.  She also has followed with nephrology and was to undergo a further autoimmune work-up due to a positive ANA recently. Also she was seen in the office on 10/17/2020 by nephrology and found to have acute on chronic renal failure stage IIIb; this was considered multifactorial in setting of chronic meloxicam use, ARB, hydrochlorothiazide, and decreased oral intake.  She was instructed to increase her fluid intake and hold aforementioned medications (the patient interpreted this to me and holding all of her home meds even Synthroid). Due to her progressively worsening weakness, inability to maintain nutrition she was brought to the hospital for IVF and GI consult at family request.   She was found to have ongoing elevated creatinine, 1.53, BUN 30. Na 122 WBC 9.7, hemoglobin 10.9, platelets 92  She was given a 500 cc normal saline bolus in the ER.    I have personally briefly reviewed patient's old medical records in [facility] and discussed patient with the ER provider when appropriate/indicated.  Assessment/Plan: * Acute renal failure  superimposed on stage 3b chronic kidney disease (HCC) - baseline creat around 1.2, has had progressively increasing creatinine with associated poor oral intake; but also use of ARB, hctz, chronic meloxicam - start NS; trend BMP given hypoNa - holding nephrotoxic agents - hopefully can resume ARB at d/c; may need to leave her off hctz and NSAIDs - follow outpatient with nephrology   Weight loss -Unintentional and approximately 20 pounds per grandson and patient -Ongoing since approximately October despite patient trying to eat -She will require IV fluids due to inability to maintain adequate nutrition orally -RD consult -GI consulted per family request; last CLN 2004 (polyps, diverticulosis; no EGD prior)  Atrial fibrillation with RVR ([place]) -Apparently a new diagnosis for patient with no prior history.  Briefly discussed with grandson in the ER.  - CHA2DS2-VASc = 4 for now (checking A1c) - obtain echo given LVH on EKG and repol abn'l - seems would be a candidate for anticoagulation (no prior hx bleeding, excessive falls); await workup for weight loss etc in case of need for procedures - monitor on tele - no complaints of CP; check trop - lopressor  Generalized muscle weakness -No focal weakness, patient is globally deconditioned and has been unable to ambulate independently anymore at home and has previously been independent.  She is amenable to rehab if recommended -PT/OT consulted  Osteoarthritis -Patient endorses pains in her ankles and knees mostly; worse lately now that off meloxicam unsurprisingly - use tylenol for now  HTN (hypertension) -Almost mildly orthostatic in the ER but asymptomatic -Hold home meds for now -If becomes elevated will resume alternative agent while kidney  function recovers  Hypothyroidism -Check TSH -Continue home Synthroid.  She has not taken for about 5 days because she thought she was supposed to hold all her medications  Gout -Has been on  chronic meloxicam for years to control chronic pain.  She appears to have osteoarthritis to me rather than a gout flare which is what she is worried about -Hold meloxicam given renal failure -Check uric acid level.  Has never been tried on allopurinol according to her grandson    Code Status: Full DVT Prophylaxis: Lovenox Anticipated disposition is to: likely rehab  History: Past Medical History:  Diagnosis Date  . Adenocarcinoma of left lung ([place]) 2006  . Arthritis   . Asthma   . Cancer of breast, female ([place])   . Cancer of lung ([place])   . Hypertension   . Hypothyroidism   . Invasive ductal carcinoma of left breast ([place]) 1999  . Neutropenia ([place]) 06/09/2016  . Personal history of radiation therapy     Past Surgical History:  Procedure Laterality Date  . BREAST BIOPSY     left axillary node dissection  . BREAST LUMPECTOMY Left   . COLONOSCOPY  03/08/2003   BCW:[id number] rectal polyps destroyed with the tip of the snare/Polyps at hepatic flexure, splenic flexure at 35 cm/Left-sided diverticula: unable to retrieve path  . COLONOSCOPY  09/12/2008   TUU:[id number] rectum and distal sigmoid diminutive polyps/scattered left sided diverticulum. hyperplastic  . COLONOSCOPY N/A 03/06/2013   KJZ:PHXTAVW polyp-removed as described above; colonic diverticulosis. hyperplastic polyps. next TCS 03/2018  . FOOT SURGERY    . LUNG REMOVAL, PARTIAL     upper lobe     reports that she quit smoking about 28 years ago. She has a 26.25 pack-year smoking history. She has never used smokeless tobacco. She reports that she does not drink alcohol and does not use drugs.  Allergies  Allergen Reactions  . Meloxicam Other (See Comments)    Caused an injury to the kidneys, per nephrologist  . Micardis Hct [Telmisartan-Hctz] Other (See Comments)    Caused an injury to the kidneys, per nephrologist    Family History  Problem Relation Age of Onset  . Cancer Mother   . Cancer Sister   . Colon cancer Neg Hx     Home Medications: Prior to Admission medications   Medication Sig Start Date End Date Taking? Authorizing Provider  clobetasol ointment (TEMOVATE) 9.79 % Apply 1 application topically as needed (for irritation). 02/14/19  Yes [provider]  Iron-FA-B Cmp-C-Biot-Probiotic (FUSION PLUS) CAPS Take 1 capsule by mouth daily.   Yes [provider]  albuterol (PROVENTIL,VENTOLIN) 90 MCG/ACT inhaler Inhale 2 puffs into the lungs every 6 (six) hours as needed for wheezing or shortness of breath.    Everardo All, MD  ferrous sulfate 325 (65 FE) MG tablet Take 325 mg by mouth daily with breakfast.    [provider]  levothyroxine (SYNTHROID, LEVOTHROID) 88 MCG tablet Take 88 mcg by mouth daily before breakfast.    [provider]  metoprolol succinate (TOPROL-XL) 50 MG 24 hr tablet Take 50 mg by mouth daily. Take with or immediately following a meal.    [provider]  Multiple Vitamin (MULTIVITAMIN WITH MINERALS) TABS tablet Take 1 tablet by mouth daily.    [provider]  TRELEGY ELLIPTA 100-62.5-25 MCG/INH AEPB Inhale 1 puff into the lungs daily.  01/04/19   [provider]    Review of Systems:  Pertinent items noted in HPI and  remainder of comprehensive ROS otherwise negative.  Physical Exam: Vitals:   10/21/20 1615 10/21/20 1630 10/21/20 1645 10/21/20 1700  BP: (!) 144/57 112/63 (!) 124/58 136/77  Pulse: (!) 113 96 (!) 101 (!) 108  Resp: (!) 32 (!) 26 (!) 24 (!) 30  Temp:      TempSrc:      SpO2: 99% 98% 98% 99%  Weight:      Height:       General appearance: alert, cooperative and no distress Head: Normocephalic, without obvious abnormality, atraumatic Eyes: EOMI Lungs: clear to auscultation bilaterally Heart: irregularly irregular rhythm and S1, S2 normal Abdomen: normal findings: bowel sounds normal and soft, non-tender Extremities: no edema. TTP in left knee Skin: mobility and turgor normal Neurologic:  weakness in LLE 2/2 pain; sensation intact; RUE also weaker than LUE  Labs on Admission:  I have personally reviewed following labs and imaging studies Results for orders placed or performed during the hospital encounter of 10/21/20 (from the past 24 hour(s))  CBG monitoring, ED     Status: Abnormal   Collection Time: 10/21/20 11:19 AM  Result Value Ref Range   Glucose-Capillary 125 (H) 70 - 99 mg/dL   Comment 1 Notify RN    Comment 2 Document in Chart   Basic metabolic panel     Status: Abnormal   Collection Time: 10/21/20 11:23 AM  Result Value Ref Range   Sodium 122 (L) 135 - 145 mmol/L   Potassium 3.8 3.5 - 5.1 mmol/L   Chloride 88 (L) 98 - 111 mmol/L   CO2 21 (L) 22 - 32 mmol/L   Glucose, Bld 121 (H) 70 - 99 mg/dL   BUN 30 (H) 8 - 23 mg/dL   Creatinine, Ser 1.53 (H) 0.44 - 1.00 mg/dL   Calcium 9.4 8.9 - 10.3 mg/dL   GFR, Estimated 33 (L) >60 mL/min   Anion gap 13 5 - 15  CBC     Status: Abnormal   Collection Time: 10/21/20 11:23 AM  Result Value Ref Range   WBC 9.5 4.0 - 10.5 K/uL   RBC 3.82 (L) 3.87 - 5.11 MIL/uL   Hemoglobin 10.9 (L) 12.0 - 15.0 g/dL   HCT 35.6 (L) 36.0 - 46.0 %   MCV 93.2 80.0 - 100.0 fL   MCH 28.5 26.0 - 34.0 pg   MCHC 30.6 30.0 - 36.0 g/dL   RDW 15.5 11.5 - 15.5 %   Platelets 89 (L) 150 - 400 K/uL   nRBC 0.4 (H) 0.0 - 0.2 %  Urinalysis, Routine w reflex microscopic Urine, Clean Catch     Status: Abnormal   Collection Time: 10/21/20 11:23 AM  Result Value Ref Range   Color, Urine YELLOW YELLOW   APPearance CLOUDY (A) CLEAR   Specific Gravity, Urine 1.012 1.005 - 1.030   pH 5.0 5.0 - 8.0   Glucose, UA NEGATIVE NEGATIVE mg/dL   Hgb urine dipstick NEGATIVE NEGATIVE   Bilirubin Urine NEGATIVE NEGATIVE   Ketones, ur NEGATIVE NEGATIVE mg/dL   Protein, ur NEGATIVE NEGATIVE mg/dL   Nitrite NEGATIVE NEGATIVE   Leukocytes,Ua SMALL (A) NEGATIVE   RBC / HPF 0-5 0 - 5 RBC/hpf   WBC, UA 11-20 0 - 5 WBC/hpf   Bacteria, UA RARE (A) NONE SEEN   Squamous  Epithelial / LPF 11-20 0 - 5   Mucus PRESENT   Hepatic function panel     Status: Abnormal   Collection Time: 10/21/20 11:23 AM  Result Value Ref Range  Total Protein 8.1 6.5 - 8.1 g/dL   Albumin 3.3 (L) 3.5 - 5.0 g/dL   AST 20 15 - 41 U/L   ALT 13 0 - 44 U/L   Alkaline Phosphatase 36 (L) 38 - 126 U/L   Total Bilirubin 1.5 (H) 0.3 - 1.2 mg/dL   Bilirubin, Direct 0.3 (H) 0.0 - 0.2 mg/dL   Indirect Bilirubin 1.2 (H) 0.3 - 0.9 mg/dL  CBC with Differential/Platelet     Status: Abnormal   Collection Time: 10/21/20 11:23 AM  Result Value Ref Range   WBC 9.7 4.0 - 10.5 K/uL   RBC 3.78 (L) 3.87 - 5.11 MIL/uL   Hemoglobin 10.9 (L) 12.0 - 15.0 g/dL   HCT 35.5 (L) 36.0 - 46.0 %   MCV 93.9 80.0 - 100.0 fL   MCH 28.8 26.0 - 34.0 pg   MCHC 30.7 30.0 - 36.0 g/dL   RDW 15.4 11.5 - 15.5 %   Platelets 92 (L) 150 - 400 K/uL   nRBC 0.4 (H) 0.0 - 0.2 %   Neutrophils Relative % 19 %   Neutro Abs 1.9 1.7 - 7.7 K/uL   Lymphocytes Relative 13 %   Lymphs Abs 1.3 0.7 - 4.0 K/uL   Monocytes Relative 66 %   Monocytes Absolute 6.4 (H) 0.1 - 1.0 K/uL   Eosinophils Relative 0 %   Eosinophils Absolute 0.0 0.0 - 0.5 K/uL   Basophils Relative 0 %   Basophils Absolute 0.0 0.0 - 0.1 K/uL   Immature Granulocytes 2 %   Abs Immature Granulocytes 0.14 (H) 0.00 - 0.07 K/uL  CBG monitoring, ED     Status: Abnormal   Collection Time: 10/21/20  1:04 PM  Result Value Ref Range   Glucose-Capillary 111 (H) 70 - 99 mg/dL   Comment 1 Notify RN    Comment 2 Document in Chart   Resp Panel by RT-PCR (Flu A&B, Covid) Nasopharyngeal Swab     Status: None   Collection Time: 10/21/20  1:04 PM   Specimen: Nasopharyngeal Swab; Nasopharyngeal(NP) swabs in vial transport medium  Result Value Ref Range   SARS Coronavirus 2 by RT PCR NEGATIVE NEGATIVE   Influenza A by PCR NEGATIVE NEGATIVE   Influenza B by PCR NEGATIVE NEGATIVE     Radiological Exams on Admission: DG Chest 2 View  Result Date: 10/21/2020 CLINICAL DATA:   Weakness EXAM: CHEST - 2 VIEW COMPARISON:  05/31/2017 FINDINGS: Cardiac shadow is stable. Aortic calcifications are noted. The lungs are well aerated bilaterally. No focal infiltrate or sizable effusion is seen. No bony abnormality is noted. IMPRESSION: No acute abnormality noted. Electronically Signed   By: Inez Catalina M.D.   On: 10/21/2020 13:59   CT Head Wo Contrast  Result Date: 10/21/2020 CLINICAL DATA:  Altered level of consciousness EXAM: CT HEAD WITHOUT CONTRAST TECHNIQUE: Contiguous axial images were obtained from the base of the skull through the vertex without intravenous contrast. COMPARISON:  06/04/2005 FINDINGS: Brain: There are confluent hypodensities throughout the periventricular white matter, more pronounced since prior study, consistent with age-indeterminate small vessel ischemic changes. These are likely chronic. No other signs of acute infarct or hemorrhage. Lateral ventricles and remaining midline structures are unremarkable. No acute extra-axial fluid collections. No mass effect. Vascular: No hyperdense vessel or unexpected calcification. Skull: Normal. Negative for fracture or focal lesion. Sinuses/Orbits: Minimal mucosal thickening within the sphenoid sinus. Remaining sinuses are clear. Other: None. IMPRESSION: 1. Diffuse hypodensities throughout the periventricular white matter, likely representing chronic small vessel ischemic  changes. 2. Otherwise no acute intracranial process. Electronically Signed   By: Randa Ngo M.D.   On: 10/21/2020 15:01   CT Head Wo Contrast  Final Result    DG Chest 2 View  Final Result      Consults called:  GI   EKG: Independently reviewed. afib with RVR; subtle ST depression in lateral leads   Dwyane Dee, MD [facility] 10/21/2020, 6:29 PM

## 2020-10-21 NOTE — Assessment & Plan Note (Signed)
-   baseline creat around 1.2, has had progressively increasing creatinine with associated poor oral intake; but also use of ARB, hctz, chronic meloxicam - start NS; trend BMP given hypoNa - holding nephrotoxic agents - hopefully can resume ARB at d/c; may need to leave her off hctz and NSAIDs - follow outpatient with nephrology

## 2020-10-21 NOTE — Assessment & Plan Note (Signed)
-  No focal weakness, patient is globally deconditioned and has been unable to ambulate independently anymore at home and has previously been independent.  She is amenable to rehab if recommended -PT/OT consulted

## 2020-10-21 NOTE — ED Triage Notes (Signed)
Patient accompanied by grandson assisted with obtaining chief complaint info. Concern for unintentional weight loss of greater than 20 lbs the last 3 months. C/O bilateral leg swelling, concern for gout flare up. Grandson endorsed concern for increased kidney function as well. Stated hydrochlorothiazide was d/c d/t to low BP and was started, and tapered off incorrectly on prednisone for unclear reasons,

## 2020-10-21 NOTE — Assessment & Plan Note (Addendum)
-  Apparently a new diagnosis for patient with no prior history.  Briefly discussed with grandson in the ER.  - CHA2DS2-VASc = 4 for now (checking A1c) - obtain echo given LVH on EKG and repol abn'l - seems would be a candidate for anticoagulation (no prior hx bleeding, excessive falls); await workup for weight loss etc in case of need for procedures - monitor on tele - no complaints of CP; check trop - lopressor

## 2020-10-21 NOTE — Hospital Course (Signed)
Ms Boyett is an 86 yo female with PMH breast cancer, lung cancer, hypothyroidism, gout who presents to the ER with progressively worsening weakness notably in her left lower extremity.  She has been more dependent on assistive devices at home for ambulation.  She states that she has been nauseous and not eating much for approximately 2 months with associated unintentional weight loss.  She endorses early satiety with eating and has been unable to maintain her weight. She has been followed closely outpatient by hematology recently for a pancytopenia work-up with consideration for a bone marrow biopsy to further investigate for possible MDS.  She also has followed with nephrology and was to undergo a further autoimmune work-up due to a positive ANA recently. Also she was seen in the office on 10/17/2020 by nephrology and found to have acute on chronic renal failure stage IIIb; this was considered multifactorial in setting of chronic meloxicam use, ARB, hydrochlorothiazide, and decreased oral intake.  She was instructed to increase her fluid intake and hold aforementioned medications (the patient interpreted this to me and holding all of her home meds even Synthroid). Due to her progressively worsening weakness, inability to maintain nutrition she was brought to the hospital for IVF and GI consult at family request.   She was found to have ongoing elevated creatinine, 1.53, BUN 30. Na 122 WBC 9.7, hemoglobin 10.9, platelets 92  She was given a 500 cc normal saline bolus in the ER.

## 2020-10-21 NOTE — ED Notes (Signed)
   10/21/20 1633  Orthostatic Lying   BP- Lying 133/64  Pulse- Lying 109  Orthostatic Sitting  BP- Sitting 122/68  Pulse- Sitting 131  Orthostatic Standing at 0 minutes  BP- Standing at 0 minutes  (Pt stating that she can not stand because of foot pain )

## 2020-10-21 NOTE — Assessment & Plan Note (Signed)
-  Patient endorses pains in her ankles and knees mostly; worse lately now that off meloxicam unsurprisingly - use tylenol for now

## 2020-10-22 ENCOUNTER — Inpatient Hospital Stay (HOSPITAL_COMMUNITY): Payer: Medicare Other

## 2020-10-22 DIAGNOSIS — R634 Abnormal weight loss: Secondary | ICD-10-CM

## 2020-10-22 DIAGNOSIS — N179 Acute kidney failure, unspecified: Principal | ICD-10-CM

## 2020-10-22 DIAGNOSIS — I4891 Unspecified atrial fibrillation: Secondary | ICD-10-CM

## 2020-10-22 DIAGNOSIS — R531 Weakness: Secondary | ICD-10-CM

## 2020-10-22 DIAGNOSIS — N1832 Chronic kidney disease, stage 3b: Secondary | ICD-10-CM

## 2020-10-22 LAB — MAGNESIUM: Magnesium: 1.3 mg/dL — ABNORMAL LOW (ref 1.7–2.4)

## 2020-10-22 LAB — BASIC METABOLIC PANEL
Anion gap: 12 (ref 5–15)
Anion gap: 9 (ref 5–15)
BUN: 25 mg/dL — ABNORMAL HIGH (ref 8–23)
BUN: 24 mg/dL — ABNORMAL HIGH (ref 8–23)
CO2: 21 mmol/L — ABNORMAL LOW (ref 22–32)
CO2: 24 mmol/L (ref 22–32)
Calcium: 9.2 mg/dL (ref 8.9–10.3)
Calcium: 8.7 mg/dL — ABNORMAL LOW (ref 8.9–10.3)
Chloride: 91 mmol/L — ABNORMAL LOW (ref 98–111)
Chloride: 91 mmol/L — ABNORMAL LOW (ref 98–111)
Creatinine, Ser: 1.26 mg/dL — ABNORMAL HIGH (ref 0.44–1.00)
Creatinine, Ser: 1.09 mg/dL — ABNORMAL HIGH (ref 0.44–1.00)
GFR, Estimated: 42 mL/min — ABNORMAL LOW (ref >60)
GFR, Estimated: 50 mL/min — ABNORMAL LOW (ref >60)
Glucose, Bld: 125 mg/dL — ABNORMAL HIGH (ref 70–99)
Glucose, Bld: 126 mg/dL — ABNORMAL HIGH (ref 70–99)
Potassium: 3.7 mmol/L (ref 3.5–5.1)
Potassium: 3.9 mmol/L (ref 3.5–5.1)
Sodium: 124 mmol/L — ABNORMAL LOW (ref 135–145)
Sodium: 124 mmol/L — ABNORMAL LOW (ref 135–145)

## 2020-10-22 LAB — CBC WITH DIFFERENTIAL/PLATELET
Abs Immature Granulocytes: 0.09 10*3/uL — ABNORMAL HIGH (ref 0.00–0.07)
Basophils Absolute: 0 10*3/uL (ref 0.0–0.1)
Basophils Relative: 0 %
Eosinophils Absolute: 0 10*3/uL (ref 0.0–0.5)
Eosinophils Relative: 0 %
HCT: 34.8 % — ABNORMAL LOW (ref 36.0–46.0)
Hemoglobin: 10.7 g/dL — ABNORMAL LOW (ref 12.0–15.0)
Immature Granulocytes: 1 %
Lymphocytes Relative: 25 %
Lymphs Abs: 1.6 10*3/uL (ref 0.7–4.0)
MCH: 29.2 pg (ref 26.0–34.0)
MCHC: 30.7 g/dL (ref 30.0–36.0)
MCV: 94.8 fL (ref 80.0–100.0)
Monocytes Absolute: 3.2 10*3/uL — ABNORMAL HIGH (ref 0.1–1.0)
Monocytes Relative: 52 %
Neutro Abs: 1.4 10*3/uL — ABNORMAL LOW (ref 1.7–7.7)
Neutrophils Relative %: 22 %
Platelets: 77 10*3/uL — ABNORMAL LOW (ref 150–400)
RBC: 3.67 MIL/uL — ABNORMAL LOW (ref 3.87–5.11)
RDW: 15.6 % — ABNORMAL HIGH (ref 11.5–15.5)
WBC: 6.3 10*3/uL (ref 4.0–10.5)
nRBC: 0.5 % — ABNORMAL HIGH (ref 0.0–0.2)

## 2020-10-22 LAB — ECHOCARDIOGRAM COMPLETE
AR max vel: 1.56 cm2
AV Peak grad: 15.1 mmHg
Ao pk vel: 1.94 m/s
Area-P 1/2: 3.37 cm2
Height: 67 in
S' Lateral: 1.8 cm
Weight: 3056 oz

## 2020-10-22 LAB — URINE CULTURE: Culture: NO GROWTH

## 2020-10-22 LAB — TROPONIN I (HIGH SENSITIVITY)
Troponin I (High Sensitivity): 33 ng/L — ABNORMAL HIGH (ref <18)
Troponin I (High Sensitivity): 29 ng/L — ABNORMAL HIGH (ref <18)

## 2020-10-22 LAB — CORTISOL-AM, BLOOD: Cortisol - AM: 34 ug/dL — ABNORMAL HIGH (ref 6.7–22.6)

## 2020-10-22 LAB — TSH: TSH: 14.355 u[IU]/mL — ABNORMAL HIGH (ref 0.350–4.500)

## 2020-10-22 LAB — VITAMIN B12: Vitamin B-12: 545 pg/mL (ref 180–914)

## 2020-10-22 MED ORDER — ENOXAPARIN SODIUM 30 MG/0.3ML ~~LOC~~ SOLN
30.0000 mg | Freq: Every day | SUBCUTANEOUS | Status: DC
Start: 2020-10-23 — End: 2020-10-22

## 2020-10-22 MED ORDER — ALLOPURINOL 100 MG PO TABS
100.0000 mg | ORAL_TABLET | Freq: Every day | ORAL | Status: DC
  Administered 2020-10-22 – 2020-10-25 (×4): 100 mg via ORAL
  Filled 2020-10-22 (×4): qty 1

## 2020-10-22 MED ORDER — TRAMADOL HCL 50 MG PO TABS
25.0000 mg | ORAL_TABLET | Freq: Four times a day (QID) | ORAL | Status: DC | PRN
  Administered 2020-10-22: 50 mg via ORAL

## 2020-10-22 MED ORDER — TRAMADOL HCL 50 MG PO TABS
25.0000 mg | ORAL_TABLET | Freq: Four times a day (QID) | ORAL | Status: DC | PRN
  Administered 2020-10-22: 25 mg via ORAL
  Filled 2020-10-22: qty 1

## 2020-10-22 MED ORDER — MAGNESIUM SULFATE 2 GM/50ML IV SOLN
2.0000 g | Freq: Once | INTRAVENOUS | Status: AC
  Administered 2020-10-22: 11:00:00 2 g via INTRAVENOUS
  Filled 2020-10-22: qty 50

## 2020-10-22 MED ORDER — COLCHICINE 0.3 MG HALF TABLET
0.3000 mg | ORAL_TABLET | Freq: Every day | ORAL | Status: DC
  Administered 2020-10-22 – 2020-10-25 (×4): 0.3 mg via ORAL
  Filled 2020-10-22 (×4): qty 1

## 2020-10-22 MED ORDER — NYSTATIN 100000 UNIT/ML MT SUSP
5.0000 mL | Freq: Three times a day (TID) | OROMUCOSAL | Status: DC
  Administered 2020-10-22 – 2020-10-30 (×19): 500000 [IU] via ORAL
  Filled 2020-10-22 (×22): qty 5

## 2020-10-22 NOTE — Progress Notes (Signed)
  Echocardiogram 2D Echocardiogram has been performed.  Lisa [name] 10/22/2020, 3:05 PM

## 2020-10-22 NOTE — ED Notes (Signed)
ED TO INPATIENT HANDOFF REPORT  ED Nurse Name and Phone #: [phone]  S Name/Age/Gender Lisa [name] 86 y.o. female Room/Bed: 014C/014C  Code Status   Code Status: Full Code  Home/SNF/Other Home Patient oriented to: self Is this baseline? Yes   Triage Complete: Triage complete  Chief Complaint AKI (acute kidney injury) [facility]) [N17.9]  Triage Note Patient accompanied by grandson assisted with obtaining chief complaint info. Concern for unintentional weight loss of greater than 20 lbs the last 3 months. C/O bilateral leg swelling, concern for gout flare up. Grandson endorsed concern for increased kidney function as well. Stated hydrochlorothiazide was d/c d/t to low BP and was started, and tapered off incorrectly on prednisone for unclear reasons,      Allergies Allergies  Allergen Reactions  . Meloxicam Other (See Comments)    Caused an injury to the kidneys, per nephrologist  . Micardis Hct [Telmisartan-Hctz] Other (See Comments)    Caused an injury to the kidneys, per nephrologist    Level of Care/Admitting Diagnosis ED Disposition    ED Disposition Condition [place]: [place] [100100]  Level of Care: Telemetry Medical [104]  May admit patient to [facility] or [facility] if equivalent level of care is available:: No  Covid Evaluation: Asymptomatic Screening Protocol (No Symptoms)  Diagnosis: AKI (acute kidney injury) [facility]) [010272]  Admitting Physician: Dwyane Dee [place].Hoar  Attending Physician: Dwyane Dee [phone]  Estimated length of stay: past midnight tomorrow  Certification:: I certify this patient will need inpatient services for at least 2 midnights       B Medical/Surgery History Past Medical History:  Diagnosis Date  . Adenocarcinoma of left lung ([place]) 2006  . Arthritis   . Asthma   . Cancer of breast, female ([place])   . Cancer of lung ([place])   . Hypertension   . Hypothyroidism   . Invasive ductal  carcinoma of left breast ([place]) 1999  . Neutropenia ([place]) 06/09/2016  . Personal history of radiation therapy    Past Surgical History:  Procedure Laterality Date  . BREAST BIOPSY     left axillary node dissection  . BREAST LUMPECTOMY Left   . COLONOSCOPY  03/08/2003   YQI:HKVQQVZDGL rectal polyps destroyed with the tip of the snare/Polyps at hepatic flexure, splenic flexure at 35 cm/Left-sided diverticula: unable to retrieve path  . COLONOSCOPY  09/12/2008   OVF:IEPPIR rectum and distal sigmoid diminutive polyps/scattered left sided diverticulum. hyperplastic  . COLONOSCOPY N/A 03/06/2013   JJO:ACZYSAY polyp-removed as described above; colonic diverticulosis. hyperplastic polyps. next TCS 03/2018  . FOOT SURGERY    . LUNG REMOVAL, PARTIAL     upper lobe     A IV Location/Drains/Wounds Patient Lines/Drains/Airways Status    Active Line/Drains/Airways    Name Placement date Placement time Site Days   Peripheral IV 10/22/20 Right;Upper Arm 10/22/20  1027  Arm  less than 1          Intake/Output Last 24 hours  Intake/Output Summary (Last 24 hours) at 10/22/2020 1425 Last data filed at 10/22/2020 1130 Gross per 24 hour  Intake 53 ml  Output --  Net 53 ml    Labs/Imaging Results for orders placed or performed during the hospital encounter of 10/21/20 (from the past 48 hour(s))  CBG monitoring, ED     Status: Abnormal   Collection Time: 10/21/20 11:19 AM  Result Value Ref Range   Glucose-Capillary 125 (H) 70 - 99 mg/dL  Comment: Glucose reference range applies only to samples taken after fasting for at least 8 hours.   Comment 1 Notify RN    Comment 2 Document in Chart   Basic metabolic panel     Status: Abnormal   Collection Time: 10/21/20 11:23 AM  Result Value Ref Range   Sodium 122 (L) 135 - 145 mmol/L   Potassium 3.8 3.5 - 5.1 mmol/L   Chloride 88 (L) 98 - 111 mmol/L   CO2 21 (L) 22 - 32 mmol/L   Glucose, Bld 121 (H) 70 - 99 mg/dL    Comment: Glucose reference  range applies only to samples taken after fasting for at least 8 hours.   BUN 30 (H) 8 - 23 mg/dL   Creatinine, Ser 1.53 (H) 0.44 - 1.00 mg/dL   Calcium 9.4 8.9 - 10.3 mg/dL   GFR, Estimated 33 (L) >60 mL/min    Comment: (NOTE) Calculated using the CKD-EPI Creatinine Equation (2021)    Anion gap 13 5 - 15    Comment: Performed at [facility] [address], [place], [place] [address]  CBC     Status: Abnormal   Collection Time: 10/21/20 11:23 AM  Result Value Ref Range   WBC 9.5 4.0 - 10.5 K/uL   RBC 3.82 (L) 3.87 - 5.11 MIL/uL   Hemoglobin 10.9 (L) 12.0 - 15.0 g/dL   HCT 35.6 (L) 36.0 - 46.0 %   MCV 93.2 80.0 - 100.0 fL   MCH 28.5 26.0 - 34.0 pg   MCHC 30.6 30.0 - 36.0 g/dL   RDW 15.5 11.5 - 15.5 %   Platelets 89 (L) 150 - 400 K/uL    Comment: Immature Platelet Fraction may be clinically indicated, consider ordering this additional test [id number]    nRBC 0.4 (H) 0.0 - 0.2 %    Comment: Performed at [facility], [place] [address], [place], [place] [address]  Urinalysis, Routine w reflex microscopic Urine, Clean Catch     Status: Abnormal   Collection Time: 10/21/20 11:23 AM  Result Value Ref Range   Color, Urine YELLOW YELLOW   APPearance CLOUDY (A) CLEAR   Specific Gravity, Urine 1.012 1.005 - 1.030   pH 5.0 5.0 - 8.0   Glucose, UA NEGATIVE NEGATIVE mg/dL   Hgb urine dipstick NEGATIVE NEGATIVE   Bilirubin Urine NEGATIVE NEGATIVE   Ketones, ur NEGATIVE NEGATIVE mg/dL   Protein, ur NEGATIVE NEGATIVE mg/dL   Nitrite NEGATIVE NEGATIVE   Leukocytes,Ua SMALL (A) NEGATIVE   RBC / HPF 0-5 0 - 5 RBC/hpf   WBC, UA 11-20 0 - 5 WBC/hpf   Bacteria, UA RARE (A) NONE SEEN   Squamous Epithelial / LPF 11-20 0 - 5   Mucus PRESENT     Comment: Performed at [facility], [place] [address] [place], [place] [address]  Hepatic function panel     Status: Abnormal   Collection Time: 10/21/20 11:23 AM  Result Value Ref Range   Total Protein 8.1 6.5 - 8.1 g/dL   Albumin 3.3  (L) 3.5 - 5.0 g/dL   AST 20 15 - 41 U/L   ALT 13 0 - 44 U/L   Alkaline Phosphatase 36 (L) 38 - 126 U/L   Total Bilirubin 1.5 (H) 0.3 - 1.2 mg/dL   Bilirubin, Direct 0.3 (H) 0.0 - 0.2 mg/dL   Indirect Bilirubin 1.2 (H) 0.3 - 0.9 mg/dL    Comment: Performed at [facility]  [address], [place], [place] [address]  CBC with Differential/Platelet     Status: Abnormal   Collection Time: 10/21/20 11:23 AM  Result Value Ref Range   WBC 9.7 4.0 - 10.5 K/uL   RBC 3.78 (L) 3.87 - 5.11 MIL/uL   Hemoglobin 10.9 (L) 12.0 - 15.0 g/dL   HCT 35.5 (L) 36.0 - 46.0 %   MCV 93.9 80.0 - 100.0 fL   MCH 28.8 26.0 - 34.0 pg   MCHC 30.7 30.0 - 36.0 g/dL   RDW 15.4 11.5 - 15.5 %   Platelets 92 (L) 150 - 400 K/uL    Comment: REPEATED TO VERIFY PLATELET COUNT CONFIRMED BY SMEAR SPECIMEN CHECKED FOR CLOTS Immature Platelet Fraction may be clinically indicated, consider ordering this additional test [id number]    nRBC 0.4 (H) 0.0 - 0.2 %   Neutrophils Relative % 19 %   Neutro Abs 1.9 1.7 - 7.7 K/uL   Lymphocytes Relative 13 %   Lymphs Abs 1.3 0.7 - 4.0 K/uL   Monocytes Relative 66 %   Monocytes Absolute 6.4 (H) 0.1 - 1.0 K/uL   Eosinophils Relative 0 %   Eosinophils Absolute 0.0 0.0 - 0.5 K/uL   Basophils Relative 0 %   Basophils Absolute 0.0 0.0 - 0.1 K/uL   Immature Granulocytes 2 %   Abs Immature Granulocytes 0.14 (H) 0.00 - 0.07 K/uL    Comment: Performed at [facility] [address] [place], [place] [address]  CBG monitoring, ED     Status: Abnormal   Collection Time: 10/21/20  1:04 PM  Result Value Ref Range   Glucose-Capillary 111 (H) 70 - 99 mg/dL    Comment: Glucose reference range applies only to samples taken after fasting for at least 8 hours.   Comment 1 Notify RN    Comment 2 Document in Chart   Resp Panel by RT-PCR (Flu A&B, Covid) Nasopharyngeal Swab     Status: None   Collection Time: 10/21/20  1:04 PM   Specimen: Nasopharyngeal Swab; Nasopharyngeal(NP) swabs in  vial transport medium  Result Value Ref Range   SARS Coronavirus 2 by RT PCR NEGATIVE NEGATIVE    Comment: (NOTE) SARS-CoV-2 target nucleic acids are NOT DETECTED.  The SARS-CoV-2 RNA is generally detectable in upper respiratory specimens during the acute phase of infection. The lowest concentration of SARS-CoV-2 viral copies this assay can detect is 138 copies/mL. A negative result does not preclude SARS-Cov-2 infection and should not be used as the sole basis for treatment or other patient management decisions. A negative result may occur with  improper specimen collection/handling, submission of specimen other than nasopharyngeal swab, presence of viral mutation(s) within the areas targeted by this assay, and inadequate number of viral copies(<138 copies/mL). A negative result must be combined with clinical observations, patient history, and epidemiological information. The expected result is Negative.  Fact Sheet for Patients:  EntrepreneurPulse.com.au  Fact Sheet for Healthcare Providers:  IncredibleEmployment.be  This test is no t yet approved or cleared by the [place] FDA and  has been authorized for detection and/or diagnosis of SARS-CoV-2 by FDA under an Emergency Use Authorization (EUA). This EUA will remain  in effect (meaning this test can be used) for the duration of the COVID-19 declaration under Section 564(b)(1) of the Act, 21 U.S.C.section 360bbb-3(b)(1), unless the authorization is terminated  or revoked sooner.       Influenza A by PCR NEGATIVE NEGATIVE   Influenza B by PCR NEGATIVE NEGATIVE  Comment: (NOTE) The Xpert Xpress SARS-CoV-2/FLU/RSV plus assay is intended as an aid in the diagnosis of influenza from Nasopharyngeal swab specimens and should not be used as a sole basis for treatment. Nasal washings and aspirates are unacceptable for Xpert Xpress SARS-CoV-2/FLU/RSV testing.  Fact Sheet for  Patients: EntrepreneurPulse.com.au  Fact Sheet for Healthcare Providers: IncredibleEmployment.be  This test is not yet approved or cleared by the [place] FDA and has been authorized for detection and/or diagnosis of SARS-CoV-2 by FDA under an Emergency Use Authorization (EUA). This EUA will remain in effect (meaning this test can be used) for the duration of the COVID-19 declaration under Section 564(b)(1) of the Act, 21 U.S.C. section 360bbb-3(b)(1), unless the authorization is terminated or revoked.  Performed at [facility], [place] [address] [place], [place] [address]   Urine culture     Status: None   Collection Time: 10/21/20  2:36 PM   Specimen: Urine, Random  Result Value Ref Range   Specimen Description URINE, RANDOM    Special Requests NONE    Culture      NO GROWTH Performed at [facility], [place] [address], [place], [place] [address]    Report Status 10/22/2020 FINAL   Troponin I (High Sensitivity)     Status: Abnormal   Collection Time: 10/21/20  6:30 PM  Result Value Ref Range   Troponin I (High Sensitivity) 41 (H) <18 ng/L    Comment: (NOTE) Elevated high sensitivity troponin I (hsTnI) values and significant  changes across serial measurements may suggest ACS but many other  chronic and acute conditions are known to elevate hsTnI results.  Refer to the "Links" section for chest pain algorithms and additional  guidance. Performed at [facility], [place] [address] [place], [place] [address]   Uric acid     Status: Abnormal   Collection Time: 10/21/20  8:00 PM  Result Value Ref Range   Uric Acid, Serum 9.5 (H) 2.5 - 7.1 mg/dL    Comment: Performed at [facility] [address] [place], [place] [address]  Basic metabolic panel     Status: Abnormal   Collection Time: 10/21/20  8:00 PM  Result Value Ref Range   Sodium 122 (L) 135 - 145 mmol/L   Potassium 3.7 3.5 - 5.1 mmol/L   Chloride 90 (L)  98 - 111 mmol/L   CO2 22 22 - 32 mmol/L   Glucose, Bld 110 (H) 70 - 99 mg/dL    Comment: Glucose reference range applies only to samples taken after fasting for at least 8 hours.   BUN 27 (H) 8 - 23 mg/dL   Creatinine, Ser 1.17 (H) 0.44 - 1.00 mg/dL   Calcium 8.9 8.9 - 10.3 mg/dL   GFR, Estimated 46 (L) >60 mL/min    Comment: (NOTE) Calculated using the CKD-EPI Creatinine Equation (2021)    Anion gap 10 5 - 15    Comment: Performed at [facility] [address], [place], [place] [address]  Troponin I (High Sensitivity)     Status: Abnormal   Collection Time: 10/21/20  8:00 PM  Result Value Ref Range   Troponin I (High Sensitivity) 35 (H) <18 ng/L    Comment: (NOTE) Elevated high sensitivity troponin I (hsTnI) values and significant  changes across serial measurements may suggest ACS but many other  chronic and acute conditions are known to elevate hsTnI results.  Refer to the "Links" section for chest pain algorithms  and additional  guidance. Performed at [facility], [place] [address] [place], [place] [address]   Basic metabolic panel     Status: Abnormal   Collection Time: 10/22/20  2:14 AM  Result Value Ref Range   Sodium 124 (L) 135 - 145 mmol/L   Potassium 3.7 3.5 - 5.1 mmol/L   Chloride 91 (L) 98 - 111 mmol/L   CO2 21 (L) 22 - 32 mmol/L   Glucose, Bld 125 (H) 70 - 99 mg/dL    Comment: Glucose reference range applies only to samples taken after fasting for at least 8 hours.   BUN 25 (H) 8 - 23 mg/dL   Creatinine, Ser 1.26 (H) 0.44 - 1.00 mg/dL   Calcium 9.2 8.9 - 10.3 mg/dL   GFR, Estimated 42 (L) >60 mL/min    Comment: (NOTE) Calculated using the CKD-EPI Creatinine Equation (2021)    Anion gap 12 5 - 15    Comment: Performed at [facility] [address] [place], [place] [address]  CBC with Differential/Platelet     Status: Abnormal   Collection Time: 10/22/20  2:14 AM  Result Value Ref Range   WBC 6.3 4.0 - 10.5 K/uL    Comment: WHITE COUNT  CONFIRMED ON SMEAR   RBC 3.67 (L) 3.87 - 5.11 MIL/uL   Hemoglobin 10.7 (L) 12.0 - 15.0 g/dL   HCT 34.8 (L) 36.0 - 46.0 %   MCV 94.8 80.0 - 100.0 fL   MCH 29.2 26.0 - 34.0 pg   MCHC 30.7 30.0 - 36.0 g/dL   RDW 15.6 (H) 11.5 - 15.5 %   Platelets 77 (L) 150 - 400 K/uL    Comment: Immature Platelet Fraction may be clinically indicated, consider ordering this additional test [id number] CONSISTENT WITH PREVIOUS RESULT    nRBC 0.5 (H) 0.0 - 0.2 %   Neutrophils Relative % 22 %   Neutro Abs 1.4 (L) 1.7 - 7.7 K/uL   Lymphocytes Relative 25 %   Lymphs Abs 1.6 0.7 - 4.0 K/uL   Monocytes Relative 52 %   Monocytes Absolute 3.2 (H) 0.1 - 1.0 K/uL   Eosinophils Relative 0 %   Eosinophils Absolute 0.0 0.0 - 0.5 K/uL   Basophils Relative 0 %   Basophils Absolute 0.0 0.0 - 0.1 K/uL   Immature Granulocytes 1 %   Abs Immature Granulocytes 0.09 (H) 0.00 - 0.07 K/uL    Comment: Performed at [facility], [place] [address], [place], [place] [address]  Magnesium     Status: Abnormal   Collection Time: 10/22/20  2:14 AM  Result Value Ref Range   Magnesium 1.3 (L) 1.7 - 2.4 mg/dL    Comment: Performed at [facility] [address] [place], [place] [address]  Troponin I (High Sensitivity)     Status: Abnormal   Collection Time: 10/22/20  2:14 AM  Result Value Ref Range   Troponin I (High Sensitivity) 33 (H) <18 ng/L    Comment: (NOTE) Elevated high sensitivity troponin I (hsTnI) values and significant  changes across serial measurements may suggest ACS but many other  chronic and acute conditions are known to elevate hsTnI results.  Refer to the "Links" section for chest pain algorithms and additional  guidance. Performed at [facility], [place] [address] [place], [place] [address]   Troponin I (High Sensitivity)     Status: Abnormal   Collection Time: 10/22/20  8:20 AM  Result Value Ref Range  Troponin I (High Sensitivity) 29 (H) <18 ng/L    Comment: (NOTE) Elevated high  sensitivity troponin I (hsTnI) values and significant  changes across serial measurements may suggest ACS but many other  chronic and acute conditions are known to elevate hsTnI results.  Refer to the "Links" section for chest pain algorithms and additional  guidance. Performed at [facility], [place] [address] [place], [place] [address]   Cortisol-am, blood     Status: Abnormal   Collection Time: 10/22/20  8:20 AM  Result Value Ref Range   Cortisol - AM 34.0 (H) 6.7 - 22.6 ug/dL    Comment: Performed at [facility] [address], [place], [place] [address]  Vitamin B12     Status: None   Collection Time: 10/22/20  8:20 AM  Result Value Ref Range   Vitamin B-12 545 180 - 914 pg/mL    Comment: (NOTE) This assay is not validated for testing neonatal or myeloproliferative syndrome specimens for Vitamin B12 levels. Performed at [facility], [place] [address], [place], [place] [address]    DG Chest 2 View  Result Date: 10/21/2020 CLINICAL DATA:  Weakness EXAM: CHEST - 2 VIEW COMPARISON:  05/31/2017 FINDINGS: Cardiac shadow is stable. Aortic calcifications are noted. The lungs are well aerated bilaterally. No focal infiltrate or sizable effusion is seen. No bony abnormality is noted. IMPRESSION: No acute abnormality noted. Electronically Signed   By: Inez Catalina M.D.   On: 10/21/2020 13:59   CT Head Wo Contrast  Result Date: 10/21/2020 CLINICAL DATA:  Altered level of consciousness EXAM: CT HEAD WITHOUT CONTRAST TECHNIQUE: Contiguous axial images were obtained from the base of the skull through the vertex without intravenous contrast. COMPARISON:  06/04/2005 FINDINGS: Brain: There are confluent hypodensities throughout the periventricular white matter, more pronounced since prior study, consistent with age-indeterminate small vessel ischemic changes. These are likely chronic. No other signs of acute infarct or hemorrhage. Lateral ventricles and remaining midline  structures are unremarkable. No acute extra-axial fluid collections. No mass effect. Vascular: No hyperdense vessel or unexpected calcification. Skull: Normal. Negative for fracture or focal lesion. Sinuses/Orbits: Minimal mucosal thickening within the sphenoid sinus. Remaining sinuses are clear. Other: None. IMPRESSION: 1. Diffuse hypodensities throughout the periventricular white matter, likely representing chronic small vessel ischemic changes. 2. Otherwise no acute intracranial process. Electronically Signed   By: Randa Ngo M.D.   On: 10/21/2020 15:01    Pending Labs Unresulted Labs (From admission, onward)          Start     Ordered   10/22/20 8546  Basic metabolic panel  Once,   STAT       Comments: Call with results    10/22/20 0803   10/22/20 0742  Sodium, urine, random  Once,   STAT        10/22/20 0743   10/22/20 0742  Osmolality, urine  Once,   STAT        10/22/20 0743   10/22/20 2703  Basic metabolic panel  Daily,   R      10/21/20 1810   10/22/20 0500  CBC with Differential/Platelet  Daily,   R      10/21/20 1810   10/22/20 0500  Magnesium  Daily,   R      10/21/20 1810   10/21/20 1815  TSH  Add-on,   AD        10/21/20 1814   10/21/20 1123  Pathologist smear review  Once,   R        10/21/20 1123          Vitals/Pain Today's Vitals   10/22/20 1100 10/22/20 1200 10/22/20 1208 10/22/20 1424  BP: 99/63 119/66  (!) 114/58  Pulse: 71 (!) 58  61  Resp: (!) 28 (!) 28  (!) 23  Temp:    98.6 F (37 C)  TempSrc:    Oral  SpO2: 100% 96%  98%  Weight:      Height:      PainSc:   8      Isolation Precautions No active isolations  Medications Medications  ferrous sulfate tablet 325 mg (325 mg Oral Given 10/22/20 1013)  multivitamin with minerals tablet 1 tablet (1 tablet Oral Given 10/22/20 1013)  sodium chloride flush (NS) 0.9 % injection 3 mL (3 mLs Intravenous Given 10/22/20 1028)  acetaminophen (TYLENOL) tablet 650 mg (has no administration in time  range)  ondansetron (ZOFRAN) tablet 4 mg (has no administration in time range)    Or  ondansetron (ZOFRAN) injection 4 mg (has no administration in time range)  0.9 %  sodium chloride infusion (0 mLs Intravenous Paused 10/22/20 0934)  metoprolol succinate (TOPROL-XL) 24 hr tablet 50 mg (50 mg Oral Given 10/22/20 1013)  albuterol (VENTOLIN HFA) 108 (90 Base) MCG/ACT inhaler 2 puff (has no administration in time range)  levothyroxine (SYNTHROID) tablet 88 mcg (88 mcg Oral Given 10/22/20 0544)  fluticasone furoate-vilanterol (BREO ELLIPTA) 100-25 MCG/INH 1 puff (1 puff Inhalation Not Given 10/22/20 1005)    And  umeclidinium bromide (INCRUSE ELLIPTA) 62.5 MCG/INH 1 puff (1 puff Inhalation Not Given 10/22/20 1006)  allopurinol (ZYLOPRIM) tablet 100 mg (100 mg Oral Given 10/22/20 1013)  nystatin (MYCOSTATIN) 100000 UNIT/ML suspension 500,000 Units (has no administration in time range)  colchicine tablet 0.3 mg (0.3 mg Oral Given 10/22/20 1225)  traMADol (ULTRAM) tablet 25-50 mg (has no administration in time range)  sodium chloride 0.9 % bolus 500 mL (0 mLs Intravenous Stopped 10/21/20 1646)  acetaminophen (TYLENOL) tablet 1,000 mg (1,000 mg Oral Given 10/21/20 1645)  magnesium sulfate IVPB 2 g 50 mL (0 g Intravenous Stopped 10/22/20 1130)    Mobility walks with device     Focused Assessments Cardiac Assessment Handoff:    Lab Results  Component Value Date   TROPONINI <0.03 05/31/2017   No results found for: DDIMER Does the Patient currently have chest pain? No     R Recommendations: See Admitting Provider Note  Report given to:   Additional Notes:

## 2020-10-22 NOTE — ED Notes (Signed)
Lunch Tray Ordered @ [id number].

## 2020-10-22 NOTE — Consult Note (Addendum)
[facility] Consult: 9:04 AM 10/22/2020  LOS: 1 day    Referring Provider: Dr Tyrell Antonio Primary Care Physician:  Celene Squibb, MD in [place] Primary Gastroenterologist:  Dr. Molinda BailiffRosalita Chessman phone number [phone].  He is PGY year 1 at [employer] resident in internal medicine and psychiatry   Reason for Consultation:  FTT, weight loss.     HPI: Lisa [name] is a 86 y.o. female.  PMH left lung adenoCA resected 2006.  Breast CA treated with lumpectomy, radiation.  Peripheral neuropathy from cancer treatment.  Gout.  Hypothyroidism.   09/2008 colonoscopy with HP polyp.   03/2013 colonoscopy for surveillance of previous history adenomatous polyp.  Removed diminutive sigmoid polyp (hyperplastic) and noted scattered left-sided diverticulosis No previous EGD.    Just around Thanksgiving of this year patient developed anorexia.  She had 3 or 4 days of nausea and vomiting initially but this resolved and has not recurred.  She simply is not eating.  No dysphagia.  In September her weight was 220 #, current weight is 191 #.  She has gotten progressively weaker.  3 weeks ago she was driving, ambulatory, but is now unable to ambulate without assistance.  No shortness of breath, no chest pain.  Seen by heme-onc at the office 12/9.  Nephrology visit with increased renal insufficiency and was advised to stop meloxicam, ARB, HCTZ.  She went ahead and stopped all of her medications which included gabapentin for her lower extremity peripheral neuropathy.  She was started by her PCP about a week ago with a prednisone taper for proximal muscle weakness and positive ANA, ?PMR.  She has not taken prednisone for couple of days.  Denies abdominal pain, change in urinary habits, dark stool or bloody stools. She  has had episodes of hypotension even after stopping BP meds.  There is some talk of proceeding to bone marrow biopsy for suspicion of myelodysplastic syndrome, thrombocytopenia dates back to 2018.  She also complains of pain bilaterally in her knees and feet that she suspects is gout but has not had significant swelling, erythema or heat at these joints.  In the last few days she has become disoriented, confused, unable to answer simple questions accurately.  While awaiting bed placement in the ED, she has had some episodes of atrial fibrillation  Na 122. BUN/creatinine 30/1.5. T bili 1.5. Alk phos 36. AST/ALT 20/13. Albumin 3.3. Hgb 10.7, was 10.9 in early 07/2020. Platelets 77, were 70 in early 07/2020.  CT head shows changes likely represented chronic small vessel ischemia but no acute processes  Overnight the patient's confusion has resolved.   Past Medical History:  Diagnosis Date  . Adenocarcinoma of left lung ([place]) 2006  . Arthritis   . Asthma   . Cancer of breast, female ([place])   . Cancer of lung ([place])   . Hypertension   . Hypothyroidism   . Invasive ductal carcinoma of left breast ([place]) 1999  . Neutropenia ([place]) 06/09/2016  . Personal history of radiation therapy     Past Surgical History:  Procedure Laterality Date  . BREAST BIOPSY     left axillary node dissection  . BREAST LUMPECTOMY Left   . COLONOSCOPY  03/08/2003   WUX:[id number] rectal polyps destroyed with the tip of the snare/Polyps at hepatic flexure, splenic flexure at 35 cm/Left-sided diverticula: unable to retrieve path  . COLONOSCOPY  09/12/2008   ZDG:UYQIHK rectum and distal sigmoid diminutive polyps/scattered left sided diverticulum. hyperplastic  . COLONOSCOPY N/A 03/06/2013   VQQ:VZDGLOV polyp-removed as described above; colonic diverticulosis. hyperplastic polyps. next TCS 03/2018  . FOOT SURGERY    . LUNG REMOVAL, PARTIAL     upper lobe    Prior to Admission medications   Medication Sig Start Date End  Date Taking? Authorizing Provider  clobetasol ointment (TEMOVATE) 5.64 % Apply 1 application topically as needed (for irritation). 02/14/19  Yes [provider]  Iron-FA-B Cmp-C-Biot-Probiotic (FUSION PLUS) CAPS Take 1 capsule by mouth daily.   Yes [provider]  albuterol (PROVENTIL,VENTOLIN) 90 MCG/ACT inhaler Inhale 2 puffs into the lungs every 6 (six) hours as needed for wheezing or shortness of breath.    Everardo All, MD  ferrous sulfate 325 (65 FE) MG tablet Take 325 mg by mouth daily with breakfast.    [provider]  levothyroxine (SYNTHROID, LEVOTHROID) 88 MCG tablet Take 88 mcg by mouth daily before breakfast.    [provider]  metoprolol succinate (TOPROL-XL) 50 MG 24 hr tablet Take 50 mg by mouth daily. Take with or immediately following a meal.    [provider]  Multiple Vitamin (MULTIVITAMIN WITH MINERALS) TABS tablet Take 1 tablet by mouth daily.    [provider]  TRELEGY ELLIPTA 100-62.5-25 MCG/INH AEPB Inhale 1 puff into the lungs daily.  01/04/19   [provider]    Scheduled Meds: . allopurinol  100 mg Oral Daily  . colchicine  0.3 mg Oral Daily  . enoxaparin (LOVENOX) injection  30 mg Subcutaneous QHS  . ferrous sulfate  325 mg Oral Q breakfast  . fluticasone furoate-vilanterol  1 puff Inhalation Daily   And  . umeclidinium bromide  1 puff Inhalation Daily  . levothyroxine  88 mcg Oral Q0600  . metoprolol succinate  50 mg Oral Daily  . multivitamin with minerals  1 tablet Oral Daily  . nystatin  5 mL Oral TID AC & HS  . sodium chloride flush  3 mL Intravenous Q12H   Infusions: . sodium chloride 75 mL/hr at 10/22/20 0545  . magnesium sulfate bolus IVPB     PRN Meds: acetaminophen, albuterol, ondansetron **OR** ondansetron (ZOFRAN) IV, traMADol   Allergies as of 10/21/2020 - Review Complete 10/21/2020  Allergen Reaction Noted  . Meloxicam Other (See Comments) 10/21/2020  . Micardis hct  [telmisartan-hctz] Other (See Comments) 10/21/2020    Family History  Problem Relation Age of Onset  . Cancer Mother   . Cancer Sister   . Colon cancer Neg Hx     Social History   Socioeconomic History  . Marital status: Widowed    Spouse name: Not on file  . Number of children: Not on file  . Years of education: Not on file  . Highest education level: Not on file  Occupational History  . Not on file  Tobacco Use  . Smoking status: Former Smoker    Packs/day: 0.75    Years: 35.00    Pack years: 26.25    Quit date: 1993    Years since quitting: 28.9  . Smokeless tobacco: Never Used  .  Tobacco comment: smoke-free X 30 yeras  Vaping Use  . Vaping Use: Never used  Substance and Sexual Activity  . Alcohol use: No  . Drug use: No  . Sexual activity: Not on file  Other Topics Concern  . Not on file  Social History Narrative  . Not on file   Social Determinants of Health   Financial Resource Strain: Not on file  Food Insecurity: Not on file  Transportation Needs: Not on file  Physical Activity: Not on file  Stress: Not on file  Social Connections: Not on file  Intimate Partner Violence: Not on file    REVIEW OF SYSTEMS: Constitutional: Weakness ENT:  No nose bleeds Pulm: No shortness of breath, no cough CV:  No palpitations, no LE edema.  No angina GU:  No hematuria, no frequency.  No oliguria. GI: See HPI Heme: Denies unusual or excessive bleeding or bruising Transfusions: Has never received blood product transfusion before. Neuro:  No headaches, no peripheral tingling or numbness.  No seizures, no syncope. Derm:  No itching, no rash or sores.  Endocrine:  No sweats or chills.  No polyuria or dysuria Immunization: Not queried Travel:  None beyond local counties in last few months.    PHYSICAL EXAM: Vital signs in last 24 hours: Vitals:   10/22/20 0415 10/22/20 0615  BP: 115/63 (!) 113/56  Pulse: 84 81  Resp: (!) 27 19  Temp:    SpO2: 97% 94%   Wt  Readings from Last 3 Encounters:  10/21/20 86.6 kg  10/17/20 89 kg  07/16/20 95.5 kg   General: Patient looks well, not frail or chronically ill Head: No facial asymmetry or swelling.  No signs of head trauma. Eyes: No scleral icterus or conjunctival pallor.  EOMI Ears: Not hard of hearing Nose: No congestion or discharge Mouth: Mucosa is moist, pink, clear.  Full set of dentures in place.  Tongue midline.  Smile symmetric. Neck: No JVD, thyromegaly or masses. Lungs: Clear bilaterally without labored breathing.  No cough. Heart: NSR on telemetry monitor.  Distant heart sounds, audible S1, S2.  No MRG Abdomen: Soft.  Not tender, not distended.  No HSM, masses, bruits, hernias, organomegaly.  Bowel sounds normal..   Rectal: Deferred Musc/Skeltl: No joint redness, swelling or gross deformity.  No significant tenderness in the toes, MP joints or knees. Extremities: Slight, nonpitting pedal edema. Neurologic: Alert.  Oriented x3.  Appropriate.  Fluid speech.  Moves all 4 limbs without tremor, strength not tested. Skin: No rash, sores or suspicious lesions Tattoos: None observed Nodes: No cervical adenopathy. Psych: Calm, pleasant, cooperative.  Intake/Output from previous day: No intake/output data recorded. Intake/Output this shift: No intake/output data recorded.  LAB RESULTS: Recent Labs    10/21/20 1123 10/22/20 0214  WBC 9.7  9.5 6.3  HGB 10.9*  10.9* 10.7*  HCT 35.5*  35.6* 34.8*  PLT 92*  89* 77*   BMET Lab Results  Component Value Date   NA 124 (L) 10/22/2020   NA 122 (L) 10/21/2020   NA 122 (L) 10/21/2020   K 3.7 10/22/2020   K 3.7 10/21/2020   K 3.8 10/21/2020   CL 91 (L) 10/22/2020   CL 90 (L) 10/21/2020   CL 88 (L) 10/21/2020   CO2 21 (L) 10/22/2020   CO2 22 10/21/2020   CO2 21 (L) 10/21/2020   GLUCOSE 125 (H) 10/22/2020   GLUCOSE 110 (H) 10/21/2020   GLUCOSE 121 (H) 10/21/2020   BUN 25 (H) 10/22/2020  BUN 27 (H) 10/21/2020   BUN 30 (H)  10/21/2020   CREATININE 1.26 (H) 10/22/2020   CREATININE 1.17 (H) 10/21/2020   CREATININE 1.53 (H) 10/21/2020   CALCIUM 9.2 10/22/2020   CALCIUM 8.9 10/21/2020   CALCIUM 9.4 10/21/2020   LFT Recent Labs    10/21/20 1123  PROT 8.1  ALBUMIN 3.3*  AST 20  ALT 13  ALKPHOS 36*  BILITOT 1.5*  BILIDIR 0.3*  IBILI 1.2*   PT/INR Lab Results  Component Value Date   INR 1.20 05/31/2017   Hepatitis Panel No results for input(s): HEPBSAG, HCVAB, HEPAIGM, HEPBIGM in the last 72 hours. C-Diff No components found for: CDIFF Lipase     Component Value Date/Time   LIPASE 20 05/31/2017 1408    Drugs of Abuse  No results found for: LABOPIA, COCAINSCRNUR, LABBENZ, AMPHETMU, THCU, LABBARB   RADIOLOGY STUDIES: DG Chest 2 View  Result Date: 10/21/2020 CLINICAL DATA:  Weakness EXAM: CHEST - 2 VIEW COMPARISON:  05/31/2017 FINDINGS: Cardiac shadow is stable. Aortic calcifications are noted. The lungs are well aerated bilaterally. No focal infiltrate or sizable effusion is seen. No bony abnormality is noted. IMPRESSION: No acute abnormality noted. Electronically Signed   By: Inez Catalina M.D.   On: 10/21/2020 13:59   CT Head Wo Contrast  Result Date: 10/21/2020 CLINICAL DATA:  Altered level of consciousness EXAM: CT HEAD WITHOUT CONTRAST TECHNIQUE: Contiguous axial images were obtained from the base of the skull through the vertex without intravenous contrast. COMPARISON:  06/04/2005 FINDINGS: Brain: There are confluent hypodensities throughout the periventricular white matter, more pronounced since prior study, consistent with age-indeterminate small vessel ischemic changes. These are likely chronic. No other signs of acute infarct or hemorrhage. Lateral ventricles and remaining midline structures are unremarkable. No acute extra-axial fluid collections. No mass effect. Vascular: No hyperdense vessel or unexpected calcification. Skull: Normal. Negative for fracture or focal lesion.  Sinuses/Orbits: Minimal mucosal thickening within the sphenoid sinus. Remaining sinuses are clear. Other: None. IMPRESSION: 1. Diffuse hypodensities throughout the periventricular white matter, likely representing chronic small vessel ischemic changes. 2. Otherwise no acute intracranial process. Electronically Signed   By: Randa Ngo M.D.   On: 10/21/2020 15:01     IMPRESSION:   *   4 weeks anorexia.  Weight loss of 30 pounds over 3 months.  *   History left lung cancer, status post resection.  History of breast cancer status post lumpectomy, radiation.  Followed by [facility] in [place] for this and more recently for ? MDS. (Anemia, thrombocytopenia).  *   AKI.  HCTZ and meloxicam discontinued 10 days ago.  *   Proximal muscle weakness with positive ANA, brief course of prednisone taper ended a few days ago. ? PMR  *    Lower extremity knee and foot pain bilaterally, question gout.  Colchicine now in place.  *    A. fib with RVR, resolved.  Chads vas score 4.  Echocardiogram ordered, no anticoagulation initiated yet.  *    Hold off on administering subcu Lovenox until tomorrow evening.  This will reduce risk of endoscopy related bleeding should she need biopsy or other intervention.  *     Hypothyroidism.  TSH pending.  *   Elevated a.m. serum cortisol level    PLAN:     *    EGD.  Arranged for 830 tomorrow morning with final approval per Dr. Havery Moros.  *    Noncontrast CTAP??  *     N.p.o. after  midnight   Azucena Freed  10/22/2020, 9:04 AM Phone [phone]

## 2020-10-22 NOTE — ED Notes (Signed)
PA- with GI at bedside.

## 2020-10-22 NOTE — ED Notes (Addendum)
Pt found sitting on end of bed trying to eat breakfast, had wet bed and pulled out her IV, pt  Cleaned up and  Set up with breakfast, on pt call bell in reach, fall bracelet placed on pt  And yellow socks.  Pt confused but able yo walk  5 steps with walker to help get her back into bed and get bed cleaned

## 2020-10-22 NOTE — Progress Notes (Signed)
PT Cancellation Note  Patient Details Name: Lisa [name] MRN: [id number] DOB: 1934/08/04   Cancelled Treatment:    Reason Eval/Treat Not Completed: Patient declined, no reason specified she is just now starting to get to rest/get some sleep. RN and family request PT return another time. Will continue efforts.    Windell Norfolk, DPT, PN1   Supplemental Physical Therapist [facility]    Pager [phone] Acute Rehab Office [phone]

## 2020-10-22 NOTE — Progress Notes (Signed)
PROGRESS NOTE    Lisa [name]  OZD:[id number] DOB: 12-28-1933 DOA: 10/21/2020 PCP: Celene Squibb, MD   Brief Narrative: 86 year old with past medical history significant for breast cancer, lung cancer, hypothyroidism, gout who presents to the ER with progressive weakness, worse on the left lower extremity.  She also has left lower extremity pain especially in her knee and ankle.  Patient has 30-monthhistory of poor appetite and poor oral intake. She has had recent evaluation by hematology for pancytopenia.  Also was seen by nephrology in the office for AKI thought to be secondary to ARB, hydrochlorothiazide and meloxicam, ATN.  Evaluation in the ED patient was found to have AKI with a creatinine of 1.5, hyponatremia sodium down to 122.  New diagnosis of A. fib RVR.  For her weight loss and decreased oral intake GI has been consulted.   Assessment & Plan:   Principal Problem:   Acute renal failure superimposed on stage 3b chronic kidney disease (HCC) Active Problems:   Gout   Hypothyroidism   HTN (hypertension)   Weight loss   Generalized muscle weakness   Osteoarthritis   Atrial fibrillation with RVR (HPeter   1-Hyponatremia:  This could be related to hypovolemia, poor oral intake. Check TSH, cortisol level, urine sodium urine osmolality. Continue with normal saline IV fluids, increase rate to 100 cc/h.  Repeat be met tonight.   2-Failure to thrive, Generalized Muscle Weakness:  -Follow urine culture to rule out infection. -GI consulted, plan for possible endoscopy on 12/22. -Will treat oral thrush with oral nystatin. -hypomagnesemia; replete IV.   3-A fib RVR , mild elevation troponin:  -Continue with metoprolol. -Cardiology has been consulted to guide with anticoagulation.  4-Gout: Patient with left ankle and knee pain.  Uric acid elevated at 9.5 -Started on allopurinol and low dose colchicine.  -Discussed with grandson, patient was on prednisone taper last week, and  she was not able to sleep well with prednisone, and might have develop confusion from steroids as well.  -Hold on staring prednisone for now.    5-AKI superimposed on CKD 3b:  Cr peak to 1.5 prior cr 1.3--1.1 Related to recent use of NSAID, ARB, HCTZ and hypovolemia.  Continue with IV fluids.   6-Hypothyroidism:  Continue with Synthroid.   Anemia, Thrombocytopenia:  Monitor.  Repeat B 12.   Acute metabolic encephalopathy;  Patient presents with confusion, weakness.  Might be related to dehydration, electrolytes abnormalities.  Improved this am, she alert and oriented times 3.       Estimated body mass index is 29.91 kg/m as calculated from the following:   Height as of this encounter: '5\' 7"'  (1.702 m).   Weight as of this encounter: 86.6 kg.   DVT prophylaxis: scd Code Status: Full code Family Communication: Discussed with Grandson who was at bedside.  Disposition Plan:  Status is: Inpatient  Remains inpatient appropriate because:Ongoing diagnostic testing needed not appropriate for outpatient work up   Dispo: The patient is from: Home              Anticipated d/c is to: to be determine              Anticipated d/c date is: 3 days              Patient currently is not medically stable to d/c.        Consultants:  Cardiology GI  Procedures:   none  Antimicrobials:  none  Subjective: She was agitated earlier this  am, and remove her IV.  She was able to tell me her name, oriented times 3. She report poor appetite. She will try to eat more.   Objective: Vitals:   10/22/20 0045 10/22/20 0100 10/22/20 0415 10/22/20 0615  BP: 97/64 109/61 115/63 (!) 113/56  Pulse: 87 88 84 81  Resp: (!) 30 (!) 25 (!) 27 19  Temp:      TempSrc:      SpO2: 97% 97% 97% 94%  Weight:      Height:       No intake or output data in the 24 hours ending 10/22/20 0740 Filed Weights   10/21/20 1121  Weight: 86.6 kg    Examination:  General exam: Appears calm and  comfortable  Respiratory system: Clear to auscultation. Respiratory effort normal. Cardiovascular system: S1 & S2 heard, RRR. No JVD, murmurs, rubs, gallops or clicks. No pedal edema. Gastrointestinal system: Abdomen is nondistended, soft and nontender. No organomegaly or masses felt. Normal bowel sounds heard. Central nervous system: Alert and oriented. Non focal. Left side weaker than right due to pain.  Extremities: no edema    Data Reviewed: I have personally reviewed following labs and imaging studies  CBC: Recent Labs  Lab 10/21/20 1123 10/22/20 0214  WBC 9.7  9.5 6.3  NEUTROABS 1.9 1.4*  HGB 10.9*  10.9* 10.7*  HCT 35.5*  35.6* 34.8*  MCV 93.9  93.2 94.8  PLT 92*  89* 77*   Basic Metabolic Panel: Recent Labs  Lab 10/21/20 1123 10/21/20 2000 10/22/20 0214  NA 122* 122* 124*  K 3.8 3.7 3.7  CL 88* 90* 91*  CO2 21* 22 21*  GLUCOSE 121* 110* 125*  BUN 30* 27* 25*  CREATININE 1.53* 1.17* 1.26*  CALCIUM 9.4 8.9 9.2  MG  --   --  1.3*   GFR: Estimated Creatinine Clearance: 37.6 mL/min (A) (by C-G formula based on SCr of 1.26 mg/dL (H)). Liver Function Tests: Recent Labs  Lab 10/21/20 1123  AST 20  ALT 13  ALKPHOS 36*  BILITOT 1.5*  PROT 8.1  ALBUMIN 3.3*   No results for input(s): LIPASE, AMYLASE in the last 168 hours. No results for input(s): AMMONIA in the last 168 hours. Coagulation Profile: No results for input(s): INR, PROTIME in the last 168 hours. Cardiac Enzymes: No results for input(s): CKTOTAL, CKMB, CKMBINDEX, TROPONINI in the last 168 hours. BNP (last 3 results) No results for input(s): PROBNP in the last 8760 hours. HbA1C: No results for input(s): HGBA1C in the last 72 hours. CBG: Recent Labs  Lab 10/21/20 1119 10/21/20 1304  GLUCAP 125* 111*   Lipid Profile: No results for input(s): CHOL, HDL, LDLCALC, TRIG, CHOLHDL, LDLDIRECT in the last 72 hours. Thyroid Function Tests: No results for input(s): TSH, T4TOTAL, FREET4, T3FREE,  THYROIDAB in the last 72 hours. Anemia Panel: No results for input(s): VITAMINB12, FOLATE, FERRITIN, TIBC, IRON, RETICCTPCT in the last 72 hours. Sepsis Labs: No results for input(s): PROCALCITON, LATICACIDVEN in the last 168 hours.  Recent Results (from the past 240 hour(s))  Resp Panel by RT-PCR (Flu A&B, Covid) Nasopharyngeal Swab     Status: None   Collection Time: 10/21/20  1:04 PM   Specimen: Nasopharyngeal Swab; Nasopharyngeal(NP) swabs in vial transport medium  Result Value Ref Range Status   SARS Coronavirus 2 by RT PCR NEGATIVE NEGATIVE Final    Comment: (NOTE) SARS-CoV-2 target nucleic acids are NOT DETECTED.  The SARS-CoV-2 RNA is generally detectable in upper respiratory specimens during  the acute phase of infection. The lowest concentration of SARS-CoV-2 viral copies this assay can detect is 138 copies/mL. A negative result does not preclude SARS-Cov-2 infection and should not be used as the sole basis for treatment or other patient management decisions. A negative result may occur with  improper specimen collection/handling, submission of specimen other than nasopharyngeal swab, presence of viral mutation(s) within the areas targeted by this assay, and inadequate number of viral copies(<138 copies/mL). A negative result must be combined with clinical observations, patient history, and epidemiological information. The expected result is Negative.  Fact Sheet for Patients:  EntrepreneurPulse.com.au  Fact Sheet for Healthcare Providers:  IncredibleEmployment.be  This test is no t yet approved or cleared by the [place] FDA and  has been authorized for detection and/or diagnosis of SARS-CoV-2 by FDA under an Emergency Use Authorization (EUA). This EUA will remain  in effect (meaning this test can be used) for the duration of the COVID-19 declaration under Section 564(b)(1) of the Act, 21 U.S.C.section 360bbb-3(b)(1), unless the  authorization is terminated  or revoked sooner.       Influenza A by PCR NEGATIVE NEGATIVE Final   Influenza B by PCR NEGATIVE NEGATIVE Final    Comment: (NOTE) The Xpert Xpress SARS-CoV-2/FLU/RSV plus assay is intended as an aid in the diagnosis of influenza from Nasopharyngeal swab specimens and should not be used as a sole basis for treatment. Nasal washings and aspirates are unacceptable for Xpert Xpress SARS-CoV-2/FLU/RSV testing.  Fact Sheet for Patients: EntrepreneurPulse.com.au  Fact Sheet for Healthcare Providers: IncredibleEmployment.be  This test is not yet approved or cleared by the [place] FDA and has been authorized for detection and/or diagnosis of SARS-CoV-2 by FDA under an Emergency Use Authorization (EUA). This EUA will remain in effect (meaning this test can be used) for the duration of the COVID-19 declaration under Section 564(b)(1) of the Act, 21 U.S.C. section 360bbb-3(b)(1), unless the authorization is terminated or revoked.  Performed at [facility], [place] [address] [place], [place] [address]          Radiology Studies: DG Chest 2 View  Result Date: 10/21/2020 CLINICAL DATA:  Weakness EXAM: CHEST - 2 VIEW COMPARISON:  05/31/2017 FINDINGS: Cardiac shadow is stable. Aortic calcifications are noted. The lungs are well aerated bilaterally. No focal infiltrate or sizable effusion is seen. No bony abnormality is noted. IMPRESSION: No acute abnormality noted. Electronically Signed   By: Inez Catalina M.D.   On: 10/21/2020 13:59   CT Head Wo Contrast  Result Date: 10/21/2020 CLINICAL DATA:  Altered level of consciousness EXAM: CT HEAD WITHOUT CONTRAST TECHNIQUE: Contiguous axial images were obtained from the base of the skull through the vertex without intravenous contrast. COMPARISON:  06/04/2005 FINDINGS: Brain: There are confluent hypodensities throughout the periventricular white matter, more pronounced  since prior study, consistent with age-indeterminate small vessel ischemic changes. These are likely chronic. No other signs of acute infarct or hemorrhage. Lateral ventricles and remaining midline structures are unremarkable. No acute extra-axial fluid collections. No mass effect. Vascular: No hyperdense vessel or unexpected calcification. Skull: Normal. Negative for fracture or focal lesion. Sinuses/Orbits: Minimal mucosal thickening within the sphenoid sinus. Remaining sinuses are clear. Other: None. IMPRESSION: 1. Diffuse hypodensities throughout the periventricular white matter, likely representing chronic small vessel ischemic changes. 2. Otherwise no acute intracranial process. Electronically Signed   By: Randa Ngo M.D.   On: 10/21/2020 15:01        Scheduled Meds: . enoxaparin (LOVENOX) injection  30 mg Subcutaneous QHS  . ferrous sulfate  325 mg Oral Q breakfast  . fluticasone furoate-vilanterol  1 puff Inhalation Daily   And  . umeclidinium bromide  1 puff Inhalation Daily  . levothyroxine  88 mcg Oral Q0600  . metoprolol succinate  50 mg Oral Daily  . multivitamin with minerals  1 tablet Oral Daily  . sodium chloride flush  3 mL Intravenous Q12H   Continuous Infusions: . sodium chloride 75 mL/hr at 10/22/20 0545  . magnesium sulfate bolus IVPB       LOS: 1 day    Time spent: 35 minutes.     Elmarie Shiley, MD [facility]   If 7PM-7AM, please contact night-coverage [email or web address]  10/22/2020, 7:40 AM

## 2020-10-22 NOTE — Consult Note (Addendum)
Cardiology Consultation:   Patient ID: Lisa [name] MRN: [id number]; DOB: 1934-09-11  Admit date: 10/21/2020 Date of Consult: 10/22/2020  Primary Care Provider: Celene Squibb, MD [facility] Cardiologist: New (Dr. Margaretann Loveless) [place] Electrophysiologist:  None    Patient Profile:   Lisa [name] is a 86 y.o. female with a history of hypertension, COPD, hypothyroidism, gout, breast cancer diagnosed in 1999 and treated with lumpectomy and radiation as well as Tamoxifen for 5 years, lung cancer s/p left upper lobectomy in 05/2005, recent pancytopenia being worked up by Hem-Onc, and CKD stage III who presented to the ED for further evaluation of progressive weakness, unintentional weight loss, and confusion and was found to have hyponatremia. Cardiology was consulted for further evaluation of ne onset atrial fibrillation with RVR at the request of Dr. Tyrell Antonio.  History of Present Illness:   Ms. Philipps is a 86 year old female with the above history. No known cardiac history. She has never been seen by a Cardiologist or had any prior cardiac work-up. Patient has had fatigue, weight loss, proximal muscle weakness, and nausea/vomiting/diarrhea for the last several months for which she has seen multiple doctors for. PCP recently started Prednisone for possible polymyalgia rheumatica. She was recently seen by Hematology-Oncology on 10/17/2020 for follow-up of pancytopenia. Bone marrow biopsy was recommended for evaluation of MDS and GI and Rheumatology evaluations were recommended. Of note, ANA was checked back in 2019 and was positive. She was also seen by Nephrology on 10/17/2020 for evaluation of AKI with creatinine of 2.2 (baseline around 1.1 to 1.3. Felt to be due to AKI from NSAID/ARB/HCTZ use as well as decreased PO intake. Meloxicam and Telmisartan-HCTZ were stopped.   Grandson brought patient to the ED on 10/21/2020 for progressive weakness and increased confusion. In the ED, vitals  stable. EKG showed atrial fibrillation, rate 123 bpm, with LVH with biphasic T waves in V4-V6 (possible repolarization changes) and ST depressions in inferior leads. High-sensitivity troponin 41 >> 35. Chest x-ray showed no acute findings. Head CT showed diffuse hypodensities throughout the periventricular white matter likely representing chronic small vessel ischemic changes. WBC 9.5, Hgb 10.9, Plts, 89. Na 122, K 3.8, Glucose 121, BUN 130, Cr 1.5. Albumin 3.3, AST 20, ALT 13, Alk Phos 36, Total Bili 1.5, Direct Bili 0.3, Indirect Bili 1.2. Urine cultures showed no growth. Respiratory panel negative for COVID and influenza A/B. Patient was admitted for further evaluation/management of hyponatremia, encephalopathy, progressive weakness, and atrial fibrillation with RVR. GI was consulted for further evaluation of weight loss and EGD is tentatively planned for tomorrow. Cardiology consulted for further evaluation of atrial fibrillation.  At the time of this evaluation, patient resting comfortably in no acute distress. She currently back in normal sinus rhythm with rates in the 60's to 80's. Her grandson Rosalita Chessman, who is a resident at [facility], is at bedside and assists with the history. Patient has had about a 20 lb unintentional weight loss and decreased PO appetite over the past 2-3 months. Over the past 3 weeks, patient has had significant fatigue and rapid progression of weakness. 3 weeks ago she was able to walk around and complete activities of daily living independently. Then she started having proximal muscle weakness where she was unable to comb her hair, put on a shirt, or put an object on a shelf. This is why she was started on Prednisone for possible PMR. Over the last 3 weeks, she has also gone from ambulating without any assisted device to using a cane  and then a walker and now she is so week that she cannot really ambulate at all. She notes around Thanksgiving, she had 3 days of nausea, vomiting, and  diarrhea. Since then, she has had very poor PO intake. Her grandson notes that basically the only thing she is eating is yogurt. If she eats any solid food, she will vomit. The Prednisone helped her weakness some as well as her appetite but made it hard for her to sleep. Her last dose of Prednisone was a couple of days ago (it sounds like there was some confusion in her taper). Grandson notes that she has been confused at night the last couple of days. She denies any cardiac symptoms. No palpitations, chest pain, shortness of breath, orthopnea, PND, lightheadedness, dizziness, or syncope. She has some edema in her knees/ankles form gout at times. No fevers. She has a chronic cough with her COPD but no other respiratory illnesses. No abnormal bleeding in urine or stools. Stools are black from her supplemental iron.  Patient reports prior smoking history but quit in the 1990's. She does have a family history of cardiovascular disease with her mother having a "weak heart" and atrial fibrillation.   Past Medical History:  Diagnosis Date  . Adenocarcinoma of left lung ([place]) 2006  . Arthritis   . Asthma   . Cancer of breast, female ([place])   . Cancer of lung ([place])   . Hypertension   . Hypothyroidism   . Invasive ductal carcinoma of left breast ([place]) 1999  . Neutropenia ([place]) 06/09/2016  . Personal history of radiation therapy     Past Surgical History:  Procedure Laterality Date  . BREAST BIOPSY     left axillary node dissection  . BREAST LUMPECTOMY Left   . COLONOSCOPY  03/08/2003   FFM:[id number] rectal polyps destroyed with the tip of the snare/Polyps at hepatic flexure, splenic flexure at 35 cm/Left-sided diverticula: unable to retrieve path  . COLONOSCOPY  09/12/2008   TSV:XBLTJQ rectum and distal sigmoid diminutive polyps/scattered left sided diverticulum. hyperplastic  . COLONOSCOPY N/A 03/06/2013   ZES:PQZRAQT polyp-removed as described above; colonic diverticulosis. hyperplastic polyps. next  TCS 03/2018  . FOOT SURGERY    . LUNG REMOVAL, PARTIAL     upper lobe     Home Medications:  Prior to Admission medications   Medication Sig Start Date End Date Taking? Authorizing Provider  clobetasol ointment (TEMOVATE) 6.22 % Apply 1 application topically as needed (for irritation). 02/14/19  Yes [provider]  Iron-FA-B Cmp-C-Biot-Probiotic (FUSION PLUS) CAPS Take 1 capsule by mouth daily.   Yes [provider]  albuterol (PROVENTIL,VENTOLIN) 90 MCG/ACT inhaler Inhale 2 puffs into the lungs every 6 (six) hours as needed for wheezing or shortness of breath.    Everardo All, MD  ferrous sulfate 325 (65 FE) MG tablet Take 325 mg by mouth daily with breakfast.    [provider]  levothyroxine (SYNTHROID, LEVOTHROID) 88 MCG tablet Take 88 mcg by mouth daily before breakfast.    [provider]  metoprolol succinate (TOPROL-XL) 50 MG 24 hr tablet Take 50 mg by mouth daily. Take with or immediately following a meal.    [provider]  Multiple Vitamin (MULTIVITAMIN WITH MINERALS) TABS tablet Take 1 tablet by mouth daily.    [provider]  TRELEGY ELLIPTA 100-62.5-25 MCG/INH AEPB Inhale 1 puff into the lungs daily.  01/04/19   [provider]    Inpatient Medications: Scheduled Meds: . allopurinol  100 mg  Oral Daily  . colchicine  0.3 mg Oral Daily  . ferrous sulfate  325 mg Oral Q breakfast  . fluticasone furoate-vilanterol  1 puff Inhalation Daily   And  . umeclidinium bromide  1 puff Inhalation Daily  . levothyroxine  88 mcg Oral Q0600  . metoprolol succinate  50 mg Oral Daily  . multivitamin with minerals  1 tablet Oral Daily  . nystatin  5 mL Oral TID AC & HS  . sodium chloride flush  3 mL Intravenous Q12H   Continuous Infusions: . sodium chloride Stopped (10/22/20 0934)   PRN Meds: acetaminophen, albuterol, ondansetron **OR** ondansetron (ZOFRAN) IV, traMADol  Allergies:    Allergies  Allergen Reactions  .  Meloxicam Other (See Comments)    Caused an injury to the kidneys, per nephrologist  . Micardis Hct [Telmisartan-Hctz] Other (See Comments)    Caused an injury to the kidneys, per nephrologist    Social History:   Social History   Socioeconomic History  . Marital status: Widowed    Spouse name: Not on file  . Number of children: Not on file  . Years of education: Not on file  . Highest education level: Not on file  Occupational History  . Not on file  Tobacco Use  . Smoking status: Former Smoker    Packs/day: 0.75    Years: 35.00    Pack years: 26.25    Quit date: 1993    Years since quitting: 28.9  . Smokeless tobacco: Never Used  . Tobacco comment: smoke-free X 30 yeras  Vaping Use  . Vaping Use: Never used  Substance and Sexual Activity  . Alcohol use: No  . Drug use: No  . Sexual activity: Not on file  Other Topics Concern  . Not on file  Social History Narrative  . Not on file   Social Determinants of Health   Financial Resource Strain: Not on file  Food Insecurity: Not on file  Transportation Needs: Not on file  Physical Activity: Not on file  Stress: Not on file  Social Connections: Not on file  Intimate Partner Violence: Not on file    Family History:    Family History  Problem Relation Age of Onset  . Cancer Mother   . Cancer Sister   . Colon cancer Neg Hx      ROS:  Please see the history of present illness.  All other ROS reviewed and negative.     Physical Exam/Data:   Vitals:   10/22/20 0905 10/22/20 1000 10/22/20 1013 10/22/20 1100  BP: (!) 99/53 (!) 103/59 (!) 103/59 99/63  Pulse: 80 82 75 71  Resp: (!) 28 (!) 31  (!) 28  Temp: 97.7 F (36.5 C)     TempSrc: Oral     SpO2: 98% 97%  100%  Weight:      Height:        Intake/Output Summary (Last 24 hours) at 10/22/2020 1349 Last data filed at 10/22/2020 1130 Gross per 24 hour  Intake 53 ml  Output --  Net 53 ml   Last 3 Weights 10/21/2020 10/17/2020 07/16/2020  Weight (lbs)  191 lb 196 lb 1.6 oz 210 lb 8.6 oz  Weight (kg) 86.637 kg 88.95 kg 95.5 kg     Body mass index is 29.91 kg/m.  General: 86 y.o. female resting comfortably in no acute distress.  HEENT: Normocephalic and atraumatic. Sclera clear.  Neck: Supple. No carotid bruits. No JVD. Heart: RRR. Distinct S1 and S2.  No murmurs, gallops, or rubs. Radial pulses 2+ and equal bilaterally. Lungs: No increased work of breathing. Diffuse wheezing noted but no crackles or rhonchi.   Abdomen: Soft, non-distended, and non-tender to palpation. Bowel sounds present. MSK: Normal strength and tone for age. Extremities: No lower extremity edema.   Skin: Warm and dry. Neuro: Alert and oriented x3. No focal deficits. Psych: Normal affect. Responds appropriately.   EKG:  The EKG was personally reviewed and demonstrates: Atrial fibrillation, rate 123 bpm, with LVH with biphasic T waves in V4-V6 (possible repolarization changes) and ST depressions in inferior leads.  Telemetry:  Telemetry was personally reviewed and demonstrates:  Currently in sinus rhythm with rates in the 60's to 80's. Looks like she is going in and out of atrial fibrillation. Rates as high as the 140's when in atrial fibrillation but mostly reasonably well controlled.  Relevant CV Studies:  Echo pending.  Laboratory Data:  High Sensitivity Troponin:   Recent Labs  Lab 10/21/20 1830 10/21/20 2000 10/22/20 0214 10/22/20 0820  TROPONINIHS 41* 35* 33* 29*     Chemistry Recent Labs  Lab 10/21/20 1123 10/21/20 2000 10/22/20 0214  NA 122* 122* 124*  K 3.8 3.7 3.7  CL 88* 90* 91*  CO2 21* 22 21*  GLUCOSE 121* 110* 125*  BUN 30* 27* 25*  CREATININE 1.53* 1.17* 1.26*  CALCIUM 9.4 8.9 9.2  GFRNONAA 33* 46* 42*  ANIONGAP _0 Recent Labs  Lab 10/21/20 1123  PROT 8.1  ALBUMIN 3.3*  AST 20  ALT 13  ALKPHOS 36*  BILITOT 1.5*   Hematology Recent Labs  Lab 10/21/20 1123 10/22/20 0214  WBC 9.7  9.5 6.3  RBC 3.78*  3.82*  3.67*  HGB 10.9*  10.9* 10.7*  HCT 35.5*  35.6* 34.8*  MCV 93.9  93.2 94.8  MCH 28.8  28.5 29.2  MCHC 30.7  30.6 30.7  RDW 15.4  15.5 15.6*  PLT 92*  89* 77*   BNPNo results for input(s): BNP, PROBNP in the last 168 hours.  DDimer No results for input(s): DDIMER in the last 168 hours.   Radiology/Studies:  DG Chest 2 View  Result Date: 10/21/2020 CLINICAL DATA:  Weakness EXAM: CHEST - 2 VIEW COMPARISON:  05/31/2017 FINDINGS: Cardiac shadow is stable. Aortic calcifications are noted. The lungs are well aerated bilaterally. No focal infiltrate or sizable effusion is seen. No bony abnormality is noted. IMPRESSION: No acute abnormality noted. Electronically Signed   By: Inez Catalina M.D.   On: 10/21/2020 13:59   CT Head Wo Contrast  Result Date: 10/21/2020 CLINICAL DATA:  Altered level of consciousness EXAM: CT HEAD WITHOUT CONTRAST TECHNIQUE: Contiguous axial images were obtained from the base of the skull through the vertex without intravenous contrast. COMPARISON:  06/04/2005 FINDINGS: Brain: There are confluent hypodensities throughout the periventricular white matter, more pronounced since prior study, consistent with age-indeterminate small vessel ischemic changes. These are likely chronic. No other signs of acute infarct or hemorrhage. Lateral ventricles and remaining midline structures are unremarkable. No acute extra-axial fluid collections. No mass effect. Vascular: No hyperdense vessel or unexpected calcification. Skull: Normal. Negative for fracture or focal lesion. Sinuses/Orbits: Minimal mucosal thickening within the sphenoid sinus. Remaining sinuses are clear. Other: None. IMPRESSION: 1. Diffuse hypodensities throughout the periventricular white matter, likely representing chronic small vessel ischemic changes. 2. Otherwise no acute intracranial process. Electronically Signed   By: Randa Ngo M.D.   On: 10/21/2020 15:01     Assessment  and Plan:   New Onset Atrial  Fibrillation with RVR - In the setting of dehydration and poor PO intake. Looks like she is going in and out of atrial fibrillation on telemetry. Currently in normal sinus rhythm with rates in the 60's to 80's.  - Potassium 3.7 today.  - Magnesium 1.3 today. Goal >2.0. Repleted by primary team. Will recheck tomorrow.  - TSH pending. - Echo pending.  - Continue home Toprol-XL 60m daily.  - CHA2DS2-VASc = 4 (HTN, age x2, female). Patient does have chronic thrombocytopenia with platelets of 77,000 today. Patient is currently at high risk for falls given progressive weakness. Has not been started on anticoagulation yet. Will need to have continued discussions of risks/benefits. Will discuss with MD.   CHA2DS2-VASc Score = 4  This indicates a 4.8% annual risk of stroke. The patient's score is based upon: CHF History: No HTN History: Yes Diabetes History: No Stroke History: No Vascular Disease History: No Age Score: 2 Gender Score: 1   Elevated Troponin  - High-sensitivity troponin 41 >> 35 >> 33 >> 29. - EKG showed atrial fibrillation, rate 123 bpm, with LVH with biphasic T waves in V4-V6 (possible repolarization changes) and ST depressions in inferior leads.  - No chest pain or shortness of breath.  - Echo pending.  - Not consistent with ACS. Possible demand ischemia in setting of atrial fibrillation with RVR and dehydration.   Pre-Op Evaluation - EGD is tentatively planned tomorrow for work-up of weight loss.  - Patient has no active cardiac complaints. She is unable to complete >4.0 at this time due to severe weakness. However, it sounds like 3 weeks she was able to do this.  - Per Revised Cardiac Risk Index, considered very low risk with 0.4% chance of MACE. Although this does not take into account patient's age or other acute illnesses right now.  - If EF is not severe depressed, think patient would be at acceptable risk for this low risk procedure.   Hypertension - History of  hypertension but BP has been soft lately.  - Telmisartan/HCTZ recently discontinued by Nephrology due to AKI. - Continue Toprol-XL as above.   Acute on CKD Stage III - Creatinine 1.53 on admission. Recently 2.2 on labs from 10/08/2020. Nephrology stopped Telmisartan/HCTZ and Meloxicam at that time. Baseline 1.1 to 1.3.  - Improved with IV fluids. Creatinine 1.26 this morning. - Continue to avoid Nephrotoxic agents. - Continue to monitor closely.   Progressive Weakness - Patient has had proximal muscle weakness over the last 3 weeks. PCP started patient on Prednisone for possible PMR. Grandson states steroids helps her weakness some.  - Consider Neurology consult given significant weakness and concern for vasculitis and CNS problem.   Otherwise, per primary team: - Hyponatremia - Failure to thrive/significant weight loss - Generalized muscle weakness - Hypothyroidism - Gout: Uric acid 9.5. Started on Allopurinol and low dose Colchicine. - Anemia - Thrombocytopenia - Acute metabolic encephalopathy    For questions or updates, please contact CBrookePlease consult [email or web address] for contact info under    Signed, CDarreld Mclean PA-C  10/22/2020 1:49 PM  Patient seen and examined with CSande RivesPA-C.  Agree as above, with the following exceptions and changes as noted below. Ms. WShelburneis a pleasant 86yo female with a complex recent past medical history well outlined above, who presents with atrial fibrillation with RVR. She is currently having paroxysms of afib, and is rate controlled. She is slightly confused about  clinical course and recommendations, and her grandson Rosalita Chessman, a resident physician in IM/Psych at [facility] is at the bedside and provides excellent collaborative history and details. She has gone an extensive workup for rapidly progressive weakness and   Gen: NAD, CV: RRR, no murmurs, Lungs: clear, Abd: soft, Extrem: Warm, well perfused, no edema, Neuro/Psych:  alert, somewhat confused, pleasant All available labs, radiology testing, previous records reviewed. I have reviewed her case in detail with Sande Rives, and her grandson at the bedside. I would consider neurology consultation for symptoms of rapidly progressive muscle weakness. Regarding anticoagulation, she is not keen to start this today. She wants to wait a few weeks. Her family will discuss with her and determine together next steps. Her EGD may also inform further on safety of starting AC. She does not want to go forward with EGD tomorrow but she will discuss with family.   Elouise Munroe, MD 10/22/20 8:06 PM

## 2020-10-22 NOTE — H&P (View-Only) (Signed)
[facility] Consult: 9:04 AM 10/22/2020  LOS: 1 day    Referring Provider: Dr Tyrell Antonio Primary Care Physician:  Celene Squibb, MD in [place] Primary Gastroenterologist:  Dr. Molinda BailiffRosalita Chessman phone number [phone].  He is PGY year 1 at [employer] resident in internal medicine and psychiatry   Reason for Consultation:  FTT, weight loss.     HPI: Lisa [name] is a 86 y.o. female.  PMH left lung adenoCA resected 2006.  Breast CA treated with lumpectomy, radiation.  Peripheral neuropathy from cancer treatment.  Gout.  Hypothyroidism.   09/2008 colonoscopy with HP polyp.   03/2013 colonoscopy for surveillance of previous history adenomatous polyp.  Removed diminutive sigmoid polyp (hyperplastic) and noted scattered left-sided diverticulosis No previous EGD.    Just around Thanksgiving of this year patient developed anorexia.  She had 3 or 4 days of nausea and vomiting initially but this resolved and has not recurred.  She simply is not eating.  No dysphagia.  In September her weight was 220 #, current weight is 191 #.  She has gotten progressively weaker.  3 weeks ago she was driving, ambulatory, but is now unable to ambulate without assistance.  No shortness of breath, no chest pain.  Seen by heme-onc at the office 12/9.  Nephrology visit with increased renal insufficiency and was advised to stop meloxicam, ARB, HCTZ.  She went ahead and stopped all of her medications which included gabapentin for her lower extremity peripheral neuropathy.  She was started by her PCP about a week ago with a prednisone taper for proximal muscle weakness and positive ANA, ?PMR.  She has not taken prednisone for couple of days.  Denies abdominal pain, change in urinary habits, dark stool or bloody stools. She  has had episodes of hypotension even after stopping BP meds.  There is some talk of proceeding to bone marrow biopsy for suspicion of myelodysplastic syndrome, thrombocytopenia dates back to 2018.  She also complains of pain bilaterally in her knees and feet that she suspects is gout but has not had significant swelling, erythema or heat at these joints.  In the last few days she has become disoriented, confused, unable to answer simple questions accurately.  While awaiting bed placement in the ED, she has had some episodes of atrial fibrillation  Na 122. BUN/creatinine 30/1.5. T bili 1.5. Alk phos 36. AST/ALT 20/13. Albumin 3.3. Hgb 10.7, was 10.9 in early 07/2020. Platelets 77, were 70 in early 07/2020.  CT head shows changes likely represented chronic small vessel ischemia but no acute processes  Overnight the patient's confusion has resolved.   Past Medical History:  Diagnosis Date  . Adenocarcinoma of left lung ([place]) 2006  . Arthritis   . Asthma   . Cancer of breast, female ([place])   . Cancer of lung ([place])   . Hypertension   . Hypothyroidism   . Invasive ductal carcinoma of left breast ([place]) 1999  . Neutropenia ([place]) 06/09/2016  . Personal history of radiation therapy     Past Surgical History:  Procedure Laterality Date  . BREAST BIOPSY     left axillary node dissection  . BREAST LUMPECTOMY Left   . COLONOSCOPY  03/08/2003   WUX:[id number] rectal polyps destroyed with the tip of the snare/Polyps at hepatic flexure, splenic flexure at 35 cm/Left-sided diverticula: unable to retrieve path  . COLONOSCOPY  09/12/2008   ZDG:UYQIHK rectum and distal sigmoid diminutive polyps/scattered left sided diverticulum. hyperplastic  . COLONOSCOPY N/A 03/06/2013   VQQ:VZDGLOV polyp-removed as described above; colonic diverticulosis. hyperplastic polyps. next TCS 03/2018  . FOOT SURGERY    . LUNG REMOVAL, PARTIAL     upper lobe    Prior to Admission medications   Medication Sig Start Date End  Date Taking? Authorizing Provider  clobetasol ointment (TEMOVATE) 5.64 % Apply 1 application topically as needed (for irritation). 02/14/19  Yes [provider]  Iron-FA-B Cmp-C-Biot-Probiotic (FUSION PLUS) CAPS Take 1 capsule by mouth daily.   Yes [provider]  albuterol (PROVENTIL,VENTOLIN) 90 MCG/ACT inhaler Inhale 2 puffs into the lungs every 6 (six) hours as needed for wheezing or shortness of breath.    Everardo All, MD  ferrous sulfate 325 (65 FE) MG tablet Take 325 mg by mouth daily with breakfast.    [provider]  levothyroxine (SYNTHROID, LEVOTHROID) 88 MCG tablet Take 88 mcg by mouth daily before breakfast.    [provider]  metoprolol succinate (TOPROL-XL) 50 MG 24 hr tablet Take 50 mg by mouth daily. Take with or immediately following a meal.    [provider]  Multiple Vitamin (MULTIVITAMIN WITH MINERALS) TABS tablet Take 1 tablet by mouth daily.    [provider]  TRELEGY ELLIPTA 100-62.5-25 MCG/INH AEPB Inhale 1 puff into the lungs daily.  01/04/19   [provider]    Scheduled Meds: . allopurinol  100 mg Oral Daily  . colchicine  0.3 mg Oral Daily  . enoxaparin (LOVENOX) injection  30 mg Subcutaneous QHS  . ferrous sulfate  325 mg Oral Q breakfast  . fluticasone furoate-vilanterol  1 puff Inhalation Daily   And  . umeclidinium bromide  1 puff Inhalation Daily  . levothyroxine  88 mcg Oral Q0600  . metoprolol succinate  50 mg Oral Daily  . multivitamin with minerals  1 tablet Oral Daily  . nystatin  5 mL Oral TID AC & HS  . sodium chloride flush  3 mL Intravenous Q12H   Infusions: . sodium chloride 75 mL/hr at 10/22/20 0545  . magnesium sulfate bolus IVPB     PRN Meds: acetaminophen, albuterol, ondansetron **OR** ondansetron (ZOFRAN) IV, traMADol   Allergies as of 10/21/2020 - Review Complete 10/21/2020  Allergen Reaction Noted  . Meloxicam Other (See Comments) 10/21/2020  . Micardis hct  [telmisartan-hctz] Other (See Comments) 10/21/2020    Family History  Problem Relation Age of Onset  . Cancer Mother   . Cancer Sister   . Colon cancer Neg Hx     Social History   Socioeconomic History  . Marital status: Widowed    Spouse name: Not on file  . Number of children: Not on file  . Years of education: Not on file  . Highest education level: Not on file  Occupational History  . Not on file  Tobacco Use  . Smoking status: Former Smoker    Packs/day: 0.75    Years: 35.00    Pack years: 26.25    Quit date: 1993    Years since quitting: 28.9  . Smokeless tobacco: Never Used  .  Tobacco comment: smoke-free X 30 yeras  Vaping Use  . Vaping Use: Never used  Substance and Sexual Activity  . Alcohol use: No  . Drug use: No  . Sexual activity: Not on file  Other Topics Concern  . Not on file  Social History Narrative  . Not on file   Social Determinants of Health   Financial Resource Strain: Not on file  Food Insecurity: Not on file  Transportation Needs: Not on file  Physical Activity: Not on file  Stress: Not on file  Social Connections: Not on file  Intimate Partner Violence: Not on file    REVIEW OF SYSTEMS: Constitutional: Weakness ENT:  No nose bleeds Pulm: No shortness of breath, no cough CV:  No palpitations, no LE edema.  No angina GU:  No hematuria, no frequency.  No oliguria. GI: See HPI Heme: Denies unusual or excessive bleeding or bruising Transfusions: Has never received blood product transfusion before. Neuro:  No headaches, no peripheral tingling or numbness.  No seizures, no syncope. Derm:  No itching, no rash or sores.  Endocrine:  No sweats or chills.  No polyuria or dysuria Immunization: Not queried Travel:  None beyond local counties in last few months.    PHYSICAL EXAM: Vital signs in last 24 hours: Vitals:   10/22/20 0415 10/22/20 0615  BP: 115/63 (!) 113/56  Pulse: 84 81  Resp: (!) 27 19  Temp:    SpO2: 97% 94%   Wt  Readings from Last 3 Encounters:  10/21/20 86.6 kg  10/17/20 89 kg  07/16/20 95.5 kg   General: Patient looks well, not frail or chronically ill Head: No facial asymmetry or swelling.  No signs of head trauma. Eyes: No scleral icterus or conjunctival pallor.  EOMI Ears: Not hard of hearing Nose: No congestion or discharge Mouth: Mucosa is moist, pink, clear.  Full set of dentures in place.  Tongue midline.  Smile symmetric. Neck: No JVD, thyromegaly or masses. Lungs: Clear bilaterally without labored breathing.  No cough. Heart: NSR on telemetry monitor.  Distant heart sounds, audible S1, S2.  No MRG Abdomen: Soft.  Not tender, not distended.  No HSM, masses, bruits, hernias, organomegaly.  Bowel sounds normal..   Rectal: Deferred Musc/Skeltl: No joint redness, swelling or gross deformity.  No significant tenderness in the toes, MP joints or knees. Extremities: Slight, nonpitting pedal edema. Neurologic: Alert.  Oriented x3.  Appropriate.  Fluid speech.  Moves all 4 limbs without tremor, strength not tested. Skin: No rash, sores or suspicious lesions Tattoos: None observed Nodes: No cervical adenopathy. Psych: Calm, pleasant, cooperative.  Intake/Output from previous day: No intake/output data recorded. Intake/Output this shift: No intake/output data recorded.  LAB RESULTS: Recent Labs    10/21/20 1123 10/22/20 0214  WBC 9.7  9.5 6.3  HGB 10.9*  10.9* 10.7*  HCT 35.5*  35.6* 34.8*  PLT 92*  89* 77*   BMET Lab Results  Component Value Date   NA 124 (L) 10/22/2020   NA 122 (L) 10/21/2020   NA 122 (L) 10/21/2020   K 3.7 10/22/2020   K 3.7 10/21/2020   K 3.8 10/21/2020   CL 91 (L) 10/22/2020   CL 90 (L) 10/21/2020   CL 88 (L) 10/21/2020   CO2 21 (L) 10/22/2020   CO2 22 10/21/2020   CO2 21 (L) 10/21/2020   GLUCOSE 125 (H) 10/22/2020   GLUCOSE 110 (H) 10/21/2020   GLUCOSE 121 (H) 10/21/2020   BUN 25 (H) 10/22/2020  BUN 27 (H) 10/21/2020   BUN 30 (H)  10/21/2020   CREATININE 1.26 (H) 10/22/2020   CREATININE 1.17 (H) 10/21/2020   CREATININE 1.53 (H) 10/21/2020   CALCIUM 9.2 10/22/2020   CALCIUM 8.9 10/21/2020   CALCIUM 9.4 10/21/2020   LFT Recent Labs    10/21/20 1123  PROT 8.1  ALBUMIN 3.3*  AST 20  ALT 13  ALKPHOS 36*  BILITOT 1.5*  BILIDIR 0.3*  IBILI 1.2*   PT/INR Lab Results  Component Value Date   INR 1.20 05/31/2017   Hepatitis Panel No results for input(s): HEPBSAG, HCVAB, HEPAIGM, HEPBIGM in the last 72 hours. C-Diff No components found for: CDIFF Lipase     Component Value Date/Time   LIPASE 20 05/31/2017 1408    Drugs of Abuse  No results found for: LABOPIA, COCAINSCRNUR, LABBENZ, AMPHETMU, THCU, LABBARB   RADIOLOGY STUDIES: DG Chest 2 View  Result Date: 10/21/2020 CLINICAL DATA:  Weakness EXAM: CHEST - 2 VIEW COMPARISON:  05/31/2017 FINDINGS: Cardiac shadow is stable. Aortic calcifications are noted. The lungs are well aerated bilaterally. No focal infiltrate or sizable effusion is seen. No bony abnormality is noted. IMPRESSION: No acute abnormality noted. Electronically Signed   By: Inez Catalina M.D.   On: 10/21/2020 13:59   CT Head Wo Contrast  Result Date: 10/21/2020 CLINICAL DATA:  Altered level of consciousness EXAM: CT HEAD WITHOUT CONTRAST TECHNIQUE: Contiguous axial images were obtained from the base of the skull through the vertex without intravenous contrast. COMPARISON:  06/04/2005 FINDINGS: Brain: There are confluent hypodensities throughout the periventricular white matter, more pronounced since prior study, consistent with age-indeterminate small vessel ischemic changes. These are likely chronic. No other signs of acute infarct or hemorrhage. Lateral ventricles and remaining midline structures are unremarkable. No acute extra-axial fluid collections. No mass effect. Vascular: No hyperdense vessel or unexpected calcification. Skull: Normal. Negative for fracture or focal lesion.  Sinuses/Orbits: Minimal mucosal thickening within the sphenoid sinus. Remaining sinuses are clear. Other: None. IMPRESSION: 1. Diffuse hypodensities throughout the periventricular white matter, likely representing chronic small vessel ischemic changes. 2. Otherwise no acute intracranial process. Electronically Signed   By: Randa Ngo M.D.   On: 10/21/2020 15:01     IMPRESSION:   *   4 weeks anorexia.  Weight loss of 30 pounds over 3 months.  *   History left lung cancer, status post resection.  History of breast cancer status post lumpectomy, radiation.  Followed by [facility] in [place] for this and more recently for ? MDS. (Anemia, thrombocytopenia).  *   AKI.  HCTZ and meloxicam discontinued 10 days ago.  *   Proximal muscle weakness with positive ANA, brief course of prednisone taper ended a few days ago. ? PMR  *    Lower extremity knee and foot pain bilaterally, question gout.  Colchicine now in place.  *    A. fib with RVR, resolved.  Chads vas score 4.  Echocardiogram ordered, no anticoagulation initiated yet.  *    Hold off on administering subcu Lovenox until tomorrow evening.  This will reduce risk of endoscopy related bleeding should she need biopsy or other intervention.  *     Hypothyroidism.  TSH pending.  *   Elevated a.m. serum cortisol level    PLAN:     *    EGD.  Arranged for 830 tomorrow morning with final approval per Dr. Havery Moros.  *    Noncontrast CTAP??  *     N.p.o. after  midnight   Azucena Freed  10/22/2020, 9:04 AM Phone [phone]

## 2020-10-22 NOTE — ED Notes (Signed)
Grandson here to see pt

## 2020-10-22 NOTE — ED Notes (Signed)
Pt transported to [facility]

## 2020-10-22 NOTE — ED Notes (Signed)
Pt ate most of her breakfast, placed pt back in the bed and purewick in place! Call bell within her reach reach

## 2020-10-23 ENCOUNTER — Encounter (HOSPITAL_COMMUNITY): Payer: Self-pay | Admitting: Internal Medicine

## 2020-10-23 ENCOUNTER — Inpatient Hospital Stay (HOSPITAL_COMMUNITY): Payer: Medicare Other | Admitting: Certified Registered"

## 2020-10-23 ENCOUNTER — Inpatient Hospital Stay (HOSPITAL_COMMUNITY): Payer: Medicare Other

## 2020-10-23 ENCOUNTER — Encounter (HOSPITAL_COMMUNITY)
Admission: EM | Disposition: A | Discharge status: Rehabilitation Facility | Payer: Self-pay | Source: Home / Self Care | Attending: Internal Medicine

## 2020-10-23 DIAGNOSIS — N179 Acute kidney failure, unspecified: Secondary | ICD-10-CM

## 2020-10-23 DIAGNOSIS — Q399 Congenital malformation of esophagus, unspecified: Secondary | ICD-10-CM

## 2020-10-23 DIAGNOSIS — R630 Anorexia: Secondary | ICD-10-CM

## 2020-10-23 DIAGNOSIS — K3189 Other diseases of stomach and duodenum: Secondary | ICD-10-CM

## 2020-10-23 DIAGNOSIS — N1832 Chronic kidney disease, stage 3b: Secondary | ICD-10-CM

## 2020-10-23 HISTORY — PX: BIOPSY: SHX5522

## 2020-10-23 HISTORY — PX: ESOPHAGOGASTRODUODENOSCOPY (EGD) WITH PROPOFOL: SHX5813

## 2020-10-23 LAB — MAGNESIUM: Magnesium: 1.7 mg/dL (ref 1.7–2.4)

## 2020-10-23 LAB — CBC WITH DIFFERENTIAL/PLATELET
Abs Immature Granulocytes: 0.14 10*3/uL — ABNORMAL HIGH (ref 0.00–0.07)
Basophils Absolute: 0 10*3/uL (ref 0.0–0.1)
Basophils Relative: 0 %
Eosinophils Absolute: 0 10*3/uL (ref 0.0–0.5)
Eosinophils Relative: 0 %
HCT: 30.7 % — ABNORMAL LOW (ref 36.0–46.0)
Hemoglobin: 9.6 g/dL — ABNORMAL LOW (ref 12.0–15.0)
Immature Granulocytes: 2 %
Lymphocytes Relative: 15 %
Lymphs Abs: 1.2 10*3/uL (ref 0.7–4.0)
MCH: 29.7 pg (ref 26.0–34.0)
MCHC: 31.3 g/dL (ref 30.0–36.0)
MCV: 95 fL (ref 80.0–100.0)
Monocytes Absolute: 4.8 10*3/uL — ABNORMAL HIGH (ref 0.1–1.0)
Monocytes Relative: 59 %
Neutro Abs: 2 10*3/uL (ref 1.7–7.7)
Neutrophils Relative %: 24 %
Platelets: 67 10*3/uL — ABNORMAL LOW (ref 150–400)
RBC: 3.23 MIL/uL — ABNORMAL LOW (ref 3.87–5.11)
RDW: 15.6 % — ABNORMAL HIGH (ref 11.5–15.5)
WBC: 8.2 10*3/uL (ref 4.0–10.5)
nRBC: 0.2 % (ref 0.0–0.2)

## 2020-10-23 LAB — BASIC METABOLIC PANEL
Anion gap: 10 (ref 5–15)
BUN: 20 mg/dL (ref 8–23)
CO2: 22 mmol/L (ref 22–32)
Calcium: 8.4 mg/dL — ABNORMAL LOW (ref 8.9–10.3)
Chloride: 93 mmol/L — ABNORMAL LOW (ref 98–111)
Creatinine, Ser: 1.01 mg/dL — ABNORMAL HIGH (ref 0.44–1.00)
GFR, Estimated: 55 mL/min — ABNORMAL LOW (ref >60)
Glucose, Bld: 113 mg/dL — ABNORMAL HIGH (ref 70–99)
Potassium: 3.8 mmol/L (ref 3.5–5.1)
Sodium: 125 mmol/L — ABNORMAL LOW (ref 135–145)

## 2020-10-23 LAB — PATHOLOGIST SMEAR REVIEW

## 2020-10-23 LAB — T4, FREE: Free T4: 0.79 ng/dL (ref 0.61–1.12)

## 2020-10-23 LAB — OSMOLALITY: Osmolality: 276 mOsm/kg (ref 275–295)

## 2020-10-23 SURGERY — ESOPHAGOGASTRODUODENOSCOPY (EGD) WITH PROPOFOL
Anesthesia: Monitor Anesthesia Care

## 2020-10-23 MED ORDER — LACTATED RINGERS IV SOLN: INTRAVENOUS | Status: DC | PRN

## 2020-10-23 MED ORDER — PHENYLEPHRINE 40 MCG/ML (10ML) SYRINGE FOR IV PUSH (FOR BLOOD PRESSURE SUPPORT)
PREFILLED_SYRINGE | INTRAVENOUS | Status: DC | PRN
  Administered 2020-10-23: 160 ug via INTRAVENOUS
  Administered 2020-10-23 (×2): 120 ug via INTRAVENOUS

## 2020-10-23 MED ORDER — EPHEDRINE SULFATE-NACL 50-0.9 MG/10ML-% IV SOSY
PREFILLED_SYRINGE | INTRAVENOUS | Status: DC | PRN
  Administered 2020-10-23: 15 mg via INTRAVENOUS

## 2020-10-23 MED ORDER — ENSURE ENLIVE PO LIQD
237.0000 mL | Freq: Two times a day (BID) | ORAL | Status: DC
  Administered 2020-10-24 – 2020-10-30 (×12): 237 mL via ORAL

## 2020-10-23 MED ORDER — ACETAMINOPHEN 325 MG PO TABS
650.0000 mg | ORAL_TABLET | Freq: Four times a day (QID) | ORAL | Status: DC
  Administered 2020-10-23 – 2020-10-30 (×27): 650 mg via ORAL
  Filled 2020-10-23 (×27): qty 2

## 2020-10-23 MED ORDER — LIDOCAINE 2% (20 MG/ML) 5 ML SYRINGE
INTRAMUSCULAR | Status: DC | PRN
  Administered 2020-10-23: 20 mg via INTRAVENOUS

## 2020-10-23 MED ORDER — GABAPENTIN 600 MG PO TABS
300.0000 mg | ORAL_TABLET | Freq: Three times a day (TID) | ORAL | Status: DC
  Administered 2020-10-23 – 2020-10-25 (×9): 300 mg via ORAL
  Filled 2020-10-23 (×10): qty 1

## 2020-10-23 MED ORDER — PROPOFOL 500 MG/50ML IV EMUL
INTRAVENOUS | Status: DC | PRN
  Administered 2020-10-23: 150 ug/kg/min via INTRAVENOUS

## 2020-10-23 SURGICAL SUPPLY — 15 items

## 2020-10-23 NOTE — Progress Notes (Addendum)
Rehab Admissions Coordinator Note:  Patient was screened by Cleatrice Burke for appropriateness for an Inpatient Acute Rehab Consult per OT recommendations, but patient unable to mobilize beyond bed level due to pain with OT today.   At this time, we are recommending await further tolerance with therapies before proceeding with rehab consult. SHe has not yet demonstrated the tolerance to be able to the intensity rewquired of CIR admit level therapiies.. We will follow.  Cleatrice Burke RN MSN 10/23/2020, 6:28 PM  I can be reached at [phone] .Marland Kitchen

## 2020-10-23 NOTE — Progress Notes (Signed)
PROGRESS NOTE  Lisa [name]  DOB: [birth year]  PCP: Celene Squibb, MD YCX:[id number]  DOA: 10/21/2020  LOS: 2 days   Chief Complaint  Patient presents with  . Weakness   Brief narrative: Patient is an 86 year old with PMH significant for breast cancer, lung cancer, hypothyroidism, gout who presented to the ED with progressive weakness, worse on the left lower extremity. She also has left lower extremity pain especially in her knee and ankle.  Patient has 35-month history of poor appetite and poor oral intake. She has had recent evaluation by hematology for pancytopenia.  Also was seen by nephrology in the office for AKI thought to be secondary to ARB, hydrochlorothiazide and meloxicam, ATN.  Evaluation in the ED patient was found to have AKI with a creatinine of 1.5, hyponatremia sodium down to 122.  New diagnosis of A. fib RVR.  For her weight loss and decreased oral intake GI has been consulted.  Subjective: Patient was seen and examined this morning, patient underwent EGD and colonoscopy this morning without any acute findings.  Assessment/Plan: Hyponatremia:  -Hypovolemic versus SIADH related to lung cancer. -Urine studies sent. -Currently on normal saline nonlevel per hour which I will continue.  Failure to thrive, generalized muscle weakness -Follow up urine culture to rule out infection. -Continue oral nystatin for oral thrush. -Obtain CT chest/abdomen/pelvis to follow-up on malignancy  Acute metabolic encephalopathy -Mental status improving.  Continue to monitor.  hypomagnesemia;  given IV repletion.  Repeat tomorrow.  A fib RVR, mild elevation troponin:  -Continue with metoprolol. -Cardiology has been consulted to guide with anticoagulation.  Gout: Patient with left ankle and knee pain.   -Uric acid elevated at 9.5 -Started on allopurinol and low dose colchicine.  -Discussed with grandson, patient was on prednisone taper last week, and she was not able to  sleep well with prednisone, and might have develop confusion from steroids as well.  -Prednisone currently on hold.  I would resume Neurontin today. -Obtain ultrasound duplex scan to rule out DVT.   5-AKI superimposed on CKD 3b:  Cr peak to 1.5 prior cr 1.3--1.1 Related to recent use of NSAID, ARB, HCTZ and hypovolemia.  Creatinine has improved.  6-Hypothyroidism:  Continue with Synthroid.   Thrombocytopenia:  -Worsening.  Continue to monitor. Repeat B 12.  Recent Labs  Lab 10/21/20 1123 10/22/20 0214 10/23/20 0058  PLT 92*  89* 77* 67*   Recent Labs    12/19/19 1051 02/26/20 1049 07/09/20 1050 10/21/20 1123 10/22/20 0214 10/22/20 0820 10/23/20 0058  MCV 104.2* 106.1* 105.3*   < > 94.8  --  95.0  VITAMINB12 382 323 268  --   --  545  --   FOLATE 16.8  --  16.7  --   --   --   --    < > = values in this interval not displayed.    Mobility: PT eval pending Code Status:   Code Status: Full Code  Nutritional status: Body mass index is 30.87 kg/m.     Diet Order            Diet regular Room service appropriate? Yes; Fluid consistency: Thin  Diet effective now                 DVT prophylaxis: Place and maintain sequential compression device Start: 10/22/20 1202   Antimicrobials:  None Fluid: None saline at 100 mL/h Consultants: None Family Communication:  Grandson at bedside.  Status is: Inpatient  Remains inpatient appropriate because:  ongoing workup  Dispo: The patient is from: Home              Anticipated d/c is to: Home likely              Anticipated d/c date is: 2 days              Patient currently is not medically stable to d/c.       Infusions:  . sodium chloride 100 mL/hr at 10/22/20 1539    Scheduled Meds: . allopurinol  100 mg Oral Daily  . colchicine  0.3 mg Oral Daily  . ferrous sulfate  325 mg Oral Q breakfast  . fluticasone furoate-vilanterol  1 puff Inhalation Daily   And  . umeclidinium bromide  1 puff Inhalation  Daily  . gabapentin  300 mg Oral TID  . levothyroxine  88 mcg Oral Q0600  . metoprolol succinate  50 mg Oral Daily  . multivitamin with minerals  1 tablet Oral Daily  . nystatin  5 mL Oral TID AC & HS  . sodium chloride flush  3 mL Intravenous Q12H    Antimicrobials: Anti-infectives (From admission, onward)   None      PRN meds: acetaminophen, albuterol, ondansetron **OR** ondansetron (ZOFRAN) IV, traMADol   Objective: Vitals:   10/23/20 1002 10/23/20 1358  BP: 139/60 134/69  Pulse: 71 70  Resp: (!) 21 19  Temp: 97.8 F (36.6 C) (!) 97.4 F (36.3 C)  SpO2: 98% 93%    Intake/Output Summary (Last 24 hours) at 10/23/2020 1513 Last data filed at 10/23/2020 8828 Gross per 24 hour  Intake 200 ml  Output 650 ml  Net -450 ml   Filed Weights   10/21/20 1121 10/22/20 1540 10/23/20 0705  Weight: 86.6 kg 89.4 kg 89.4 kg   Weight change: 2.763 kg Body mass index is 30.87 kg/m.   Physical Exam: General exam: Pleasant, elderly African-American female.  Not in distress Skin: No rashes, lesions or ulcers. HEENT: Atraumatic, normocephalic, no obvious bleeding Lungs: Clear to auscultation bilaterally CVS: Regular rate and rhythm, no murmur GI/Abd soft, nontender, nondistended, bowel sound present CNS: Alert, awake, oriented to place and person Psychiatry: Mood appropriate Extremities: No pedal edema, no calf tenderness  Data Review: I have personally reviewed the laboratory data and studies available.  Recent Labs  Lab 10/21/20 1123 10/22/20 0214 10/23/20 0058  WBC 9.7  9.5 6.3 8.2  NEUTROABS 1.9 1.4* 2.0  HGB 10.9*  10.9* 10.7* 9.6*  HCT 35.5*  35.6* 34.8* 30.7*  MCV 93.9  93.2 94.8 95.0  PLT 92*  89* 77* 67*   Recent Labs  Lab 10/21/20 1123 10/21/20 2000 10/22/20 0214 10/22/20 1859 10/23/20 0058  NA 122* 122* 124* 124* 125*  K 3.8 3.7 3.7 3.9 3.8  CL 88* 90* 91* 91* 93*  CO2 21* 22 21* 24 22  GLUCOSE 121* 110* 125* 126* 113*  BUN 30* 27* 25* 24*  20  CREATININE 1.53* 1.17* 1.26* 1.09* 1.01*  CALCIUM 9.4 8.9 9.2 8.7* 8.4*  MG  --   --  1.3*  --  1.7   F/u labs ordered.  Signed, Terrilee Croak, MD [facility] 10/23/2020

## 2020-10-23 NOTE — Anesthesia Postprocedure Evaluation (Signed)
Anesthesia Post Note  Patient: Mykaila Blunck  Procedure(s) Performed: ESOPHAGOGASTRODUODENOSCOPY (EGD) WITH PROPOFOL (N/A ) BIOPSY     Patient location during evaluation: Endoscopy Anesthesia Type: MAC Level of consciousness: awake and alert Pain management: pain level controlled Vital Signs Assessment: post-procedure vital signs reviewed and stable Respiratory status: spontaneous breathing, nonlabored ventilation, respiratory function stable and patient connected to nasal cannula oxygen Cardiovascular status: blood pressure returned to baseline and stable Postop Assessment: no apparent nausea or vomiting Anesthetic complications: no   No complications documented.  Last Vitals:  Vitals:   10/23/20 0955 10/23/20 1002  BP:  139/60  Pulse:  71  Resp:  (!) 21  Temp:  36.6 C  SpO2: 92% 98%    Last Pain:  Vitals:   10/23/20 0841  TempSrc:   PainSc: 8                  Clarine Elrod L Kimmi Acocella

## 2020-10-23 NOTE — Interval H&P Note (Signed)
History and Physical Interval Note: Evaluated the patient this AM. She spoke with her family and she is now agreeable to EGD. I have discussed risks / benefits of EGD and anesthesia with her and she wishes to proceed. Echo done yesterday and looks okay. Further recommendations pending the results.   10/23/2020 7:36 AM  Janifer Adie Karrie Meres  has presented today for surgery, with the diagnosis of Anorexia, poor p.o. intake, 30 pound weight loss over 3 months..  The various methods of treatment have been discussed with the patient and family. After consideration of risks, benefits and other options for treatment, the patient has consented to  Procedure(s): ESOPHAGOGASTRODUODENOSCOPY (EGD) WITH PROPOFOL (N/A) as a surgical intervention.  The patient's history has been reviewed, patient examined, no change in status, stable for surgery.  I have reviewed the patient's chart and labs.  Questions were answered to the patient's satisfaction.     [place]

## 2020-10-23 NOTE — Transfer of Care (Signed)
Immediate Anesthesia Transfer of Care Note  Patient: Lisa [name]  Procedure(s) Performed: ESOPHAGOGASTRODUODENOSCOPY (EGD) WITH PROPOFOL (N/A ) BIOPSY  Patient Location: Endoscopy Unit  Anesthesia Type:MAC  Level of Consciousness: awake, alert  and oriented  Airway & Oxygen Therapy: Patient Spontanous Breathing  Post-op Assessment: Report given to RN  Post vital signs: Reviewed and stable  Last Vitals:  Vitals Value Taken Time  BP 129/69 10/23/20 0816  Temp    Pulse 79 10/23/20 0816  Resp 25 10/23/20 0816  SpO2 96 % 10/23/20 0816  Vitals shown include unvalidated device data.  Last Pain:  Vitals:   10/23/20 0705  TempSrc:   PainSc: 8          Complications: No complications documented.

## 2020-10-23 NOTE — Progress Notes (Signed)
Progress Note  Patient Name: Lisa [name] Date of Encounter: 10/23/2020  Primary Cardiologist: No primary care provider on file.   Subjective   Resting comfortably. Converted to SR.  Inpatient Medications    Scheduled Meds: . allopurinol  100 mg Oral Daily  . colchicine  0.3 mg Oral Daily  . ferrous sulfate  325 mg Oral Q breakfast  . fluticasone furoate-vilanterol  1 puff Inhalation Daily   And  . umeclidinium bromide  1 puff Inhalation Daily  . gabapentin  300 mg Oral TID  . levothyroxine  88 mcg Oral Q0600  . metoprolol succinate  50 mg Oral Daily  . multivitamin with minerals  1 tablet Oral Daily  . nystatin  5 mL Oral TID AC & HS  . sodium chloride flush  3 mL Intravenous Q12H   Continuous Infusions: . sodium chloride 100 mL/hr at 10/22/20 1539   PRN Meds: acetaminophen, albuterol, ondansetron **OR** ondansetron (ZOFRAN) IV, traMADol   Vital Signs    Vitals:   10/23/20 0839 10/23/20 0841 10/23/20 0955 10/23/20 1002  BP: (!) 125/49 (!) 139/49  139/60  Pulse: 77 75  71  Resp: (!) 23 (!) 28  (!) 21  Temp:    97.8 F (36.6 C)  TempSrc:      SpO2: 95% 95% 92% 98%  Weight:      Height:        Intake/Output Summary (Last 24 hours) at 10/23/2020 1358 Last data filed at 10/23/2020 1937 Gross per 24 hour  Intake 200 ml  Output 650 ml  Net -450 ml   Filed Weights   10/21/20 1121 10/22/20 1540 10/23/20 0705  Weight: 86.6 kg 89.4 kg 89.4 kg    Telemetry    SR rates 70s - Personally Reviewed  ECG    No new - Personally Reviewed  Physical Exam   GEN: No acute distress.   Neck: No JVD Cardiac: regular rhythm, normal rate, no murmurs, rubs, or gallops.  Respiratory: Clear to auscultation bilaterally. GI: Soft, nontender, non-distended  MS: trace bilateral edema; No deformity. Neuro:  Nonfocal  Psych: Normal affect   Labs    Chemistry Recent Labs  Lab 10/21/20 1123 10/21/20 2000 10/22/20 0214 10/22/20 1859 10/23/20 0058  NA 122*   < >  124* 124* 125*  K 3.8   < > 3.7 3.9 3.8  CL 88*   < > 91* 91* 93*  CO2 21*   < > 21* 24 22  GLUCOSE 121*   < > 125* 126* 113*  BUN 30*   < > 25* 24* 20  CREATININE 1.53*   < > 1.26* 1.09* 1.01*  CALCIUM 9.4   < > 9.2 8.7* 8.4*  PROT 8.1  --   --   --   --   ALBUMIN 3.3*  --   --   --   --   AST 20  --   --   --   --   ALT 13  --   --   --   --   ALKPHOS 36*  --   --   --   --   BILITOT 1.5*  --   --   --   --   GFRNONAA 33*   < > 42* 50* 55*  ANIONGAP 13   < > 12 9 10    < > = values in this interval not displayed.     Hematology Recent Labs  Lab 10/21/20 1123 10/22/20 0214 10/23/20  0058  WBC 9.7  9.5 6.3 8.2  RBC 3.78*  3.82* 3.67* 3.23*  HGB 10.9*  10.9* 10.7* 9.6*  HCT 35.5*  35.6* 34.8* 30.7*  MCV 93.9  93.2 94.8 95.0  MCH 28.8  28.5 29.2 29.7  MCHC 30.7  30.6 30.7 31.3  RDW 15.4  15.5 15.6* 15.6*  PLT 92*  89* 77* 67*    Cardiac EnzymesNo results for input(s): TROPONINI in the last 168 hours. No results for input(s): TROPIPOC in the last 168 hours.   BNPNo results for input(s): BNP, PROBNP in the last 168 hours.   DDimer No results for input(s): DDIMER in the last 168 hours.   Radiology    CT Head Wo Contrast  Result Date: 10/21/2020 CLINICAL DATA:  Altered level of consciousness EXAM: CT HEAD WITHOUT CONTRAST TECHNIQUE: Contiguous axial images were obtained from the base of the skull through the vertex without intravenous contrast. COMPARISON:  06/04/2005 FINDINGS: Brain: There are confluent hypodensities throughout the periventricular white matter, more pronounced since prior study, consistent with age-indeterminate small vessel ischemic changes. These are likely chronic. No other signs of acute infarct or hemorrhage. Lateral ventricles and remaining midline structures are unremarkable. No acute extra-axial fluid collections. No mass effect. Vascular: No hyperdense vessel or unexpected calcification. Skull: Normal. Negative for fracture or focal lesion.  Sinuses/Orbits: Minimal mucosal thickening within the sphenoid sinus. Remaining sinuses are clear. Other: None. IMPRESSION: 1. Diffuse hypodensities throughout the periventricular white matter, likely representing chronic small vessel ischemic changes. 2. Otherwise no acute intracranial process. Electronically Signed   By: Randa Ngo M.D.   On: 10/21/2020 15:01   ECHOCARDIOGRAM COMPLETE  Result Date: 10/22/2020    ECHOCARDIOGRAM REPORT   Patient Name:   Lisa [name] Date of Exam: 10/22/2020 Medical Rec #:  [id number]      Height:       67.0 in Accession #:    [id number]     Weight:       191.0 lb Date of Birth:  1934/09/23      BSA:          1.983 m Patient Age:    86 years       BP:           114/58 mmHg Patient Gender: F              HR:           61 bpm. Exam Location:  Inpatient Procedure: 2D Echo, Cardiac Doppler and Color Doppler Indications:    Atrial fibrillation  History:        Patient has no prior history of Echocardiogram examinations.                 Arrythmias:Atrial Fibrillation; Risk Factors:Hypertension.                 Breast and lung cancer, Renal failure.  Sonographer:    Dustin Flock Referring Phys: [place]  1. Left ventricular ejection fraction, by estimation, is 65 to 70%. The left ventricle has normal function. The left ventricle has no regional wall motion abnormalities. There is mild left ventricular hypertrophy. Left ventricular diastolic parameters are consistent with Grade I diastolic dysfunction (impaired relaxation).  2. Prominent moderator band. Right ventricular systolic function is hyperdynamic. The right ventricular size is normal. There is moderately elevated pulmonary artery systolic pressure.  3. Left atrial size was moderately dilated.  4. The mitral valve is grossly normal. Trivial mitral valve regurgitation.  5. The aortic valve is tricuspid. Aortic valve regurgitation is not visualized. Mild aortic valve sclerosis is present, with no  evidence of aortic valve stenosis.  6. The inferior vena cava is normal in size with greater than 50% respiratory variability, suggesting right atrial pressure of 3 mmHg. FINDINGS  Left Ventricle: Left ventricular ejection fraction, by estimation, is 65 to 70%. The left ventricle has normal function. The left ventricle has no regional wall motion abnormalities. The left ventricular internal cavity size was normal in size. There is  mild left ventricular hypertrophy. Left ventricular diastolic parameters are consistent with Grade I diastolic dysfunction (impaired relaxation). Indeterminate filling pressures. Right Ventricle: Prominent moderator band. The right ventricular size is normal. No increase in right ventricular wall thickness. Right ventricular systolic function is hyperdynamic. There is moderately elevated pulmonary artery systolic pressure. The tricuspid regurgitant velocity is 3.44 m/s, and with an assumed right atrial pressure of 3 mmHg, the estimated right ventricular systolic pressure is 32.2 mmHg. Left Atrium: Left atrial size was moderately dilated. Right Atrium: Right atrial size was normal in size. Pericardium: There is no evidence of pericardial effusion. Mitral Valve: The mitral valve is grossly normal. Trivial mitral valve regurgitation. Tricuspid Valve: The tricuspid valve is grossly normal. Tricuspid valve regurgitation is trivial. Aortic Valve: The aortic valve is tricuspid. Aortic valve regurgitation is not visualized. Mild aortic valve sclerosis is present, with no evidence of aortic valve stenosis. Aortic valve peak gradient measures 15.1 mmHg. Pulmonic Valve: The pulmonic valve was grossly normal. Pulmonic valve regurgitation is trivial. Aorta: The aortic root and ascending aorta are structurally normal, with no evidence of dilitation. Venous: The inferior vena cava is normal in size with greater than 50% respiratory variability, suggesting right atrial pressure of 3 mmHg. IAS/Shunts: No  atrial level shunt detected by color flow Doppler.  LEFT VENTRICLE PLAX 2D LVIDd:         3.40 cm  Diastology LVIDs:         1.80 cm  LV e' medial:    5.66 cm/s LV PW:         1.10 cm  LV E/e' medial:  13.9 LV IVS:        1.20 cm  LV e' lateral:   8.27 cm/s LVOT diam:     2.10 cm  LV E/e' lateral: 9.5 LV SV:         64 LV SV Index:   32 LVOT Area:     3.46 cm  RIGHT VENTRICLE RV Basal diam:  3.00 cm RV S prime:     13.70 cm/s TAPSE (M-mode): 2.4 cm LEFT ATRIUM             Index       RIGHT ATRIUM           Index LA diam:        3.30 cm 1.66 cm/m  RA Area:     19.70 cm LA Vol (A2C):   92.9 ml 46.85 ml/m RA Volume:   57.50 ml  29.00 ml/m LA Vol (A4C):   81.6 ml 41.15 ml/m LA Biplane Vol: 89.9 ml 45.33 ml/m  AORTIC VALVE AV Area (Vmax): 1.56 cm AV Vmax:        194.00 cm/s AV Peak Grad:   15.1 mmHg LVOT Vmax:      87.60 cm/s LVOT Vmean:     57.100 cm/s LVOT VTI:       0.186 m  AORTA Ao Root diam: 2.60 cm MITRAL VALVE  TRICUSPID VALVE MV Area (PHT): 3.37 cm    TR Peak grad:   47.3 mmHg MV Decel Time: 225 msec    TR Vmax:        344.00 cm/s MV E velocity: 78.50 cm/s MV A velocity: 52.80 cm/s  SHUNTS MV E/A ratio:  1.49        Systemic VTI:  0.19 m                            Systemic Diam: 2.10 cm Lyman Bishop MD Electronically signed by Lyman Bishop MD Signature Date/Time: 10/22/2020/4:36:05 PM    Final    VAS [place] LOWER EXTREMITY VENOUS (DVT)  Result Date: 10/23/2020  Lower Venous DVT Study Other Indications: BLE calf tenderness. Risk Factors: Hx of Cancer. Performing Technologist: Rogelia Rohrer  Examination Guidelines: A complete evaluation includes B-mode imaging, spectral Doppler, color Doppler, and power Doppler as needed of all accessible portions of each vessel. Bilateral testing is considered an integral part of a complete examination. Limited examinations for reoccurring indications may be performed as noted. The reflux portion of the exam is performed with the patient in reverse  Trendelenburg.  +---------+---------------+---------+-----------+----------+--------------+ RIGHT    CompressibilityPhasicitySpontaneityPropertiesThrombus Aging +---------+---------------+---------+-----------+----------+--------------+ CFV      Full           Yes      Yes                                 +---------+---------------+---------+-----------+----------+--------------+ SFJ      Full                                                        +---------+---------------+---------+-----------+----------+--------------+ FV Prox  Full           Yes      Yes                                 +---------+---------------+---------+-----------+----------+--------------+ FV Mid   Full           Yes      Yes                                 +---------+---------------+---------+-----------+----------+--------------+ FV DistalFull           Yes      Yes                                 +---------+---------------+---------+-----------+----------+--------------+ PFV      Full                                                        +---------+---------------+---------+-----------+----------+--------------+ POP      Full           Yes      Yes                                 +---------+---------------+---------+-----------+----------+--------------+  PTV      Full                                                        +---------+---------------+---------+-----------+----------+--------------+ PERO     Full                                                        +---------+---------------+---------+-----------+----------+--------------+   +---------+---------------+---------+-----------+----------+--------------+ LEFT     CompressibilityPhasicitySpontaneityPropertiesThrombus Aging +---------+---------------+---------+-----------+----------+--------------+ CFV      Full           Yes      Yes                                  +---------+---------------+---------+-----------+----------+--------------+ SFJ      Full                                                        +---------+---------------+---------+-----------+----------+--------------+ FV Prox  Full           Yes      Yes                                 +---------+---------------+---------+-----------+----------+--------------+ FV Mid   Full           Yes      Yes                                 +---------+---------------+---------+-----------+----------+--------------+ FV DistalFull           Yes      Yes                                 +---------+---------------+---------+-----------+----------+--------------+ PFV      Full                                                        +---------+---------------+---------+-----------+----------+--------------+ POP      Full           Yes      Yes                                 +---------+---------------+---------+-----------+----------+--------------+ PTV      Full                                                        +---------+---------------+---------+-----------+----------+--------------+  PERO     Full                                                        +---------+---------------+---------+-----------+----------+--------------+     Summary: RIGHT: - There is no evidence of deep vein thrombosis in the lower extremity.  - A cystic structure is found in the popliteal fossa.   *See table(s) above for measurements and observations.    Preliminary     Cardiac Studies   LVEF 65 to 76%, grade 1 diastolic dysfunction, normal RV size and function, moderately elevated pulmonary artery systolic pressure, no hemodynamically significant valvular heart disease.  Patient Profile     86 y.o. female with progressive weakness with unknown etiology, weight loss, hypertension, who we are seeing for atrial fibrillation with rapid ventricular response.  Assessment & Plan   Principal  Problem:   Acute renal failure superimposed on stage 3b chronic kidney disease (HCC) Active Problems:   Gout   Hypothyroidism   HTN (hypertension)   Weight loss   Generalized muscle weakness   Osteoarthritis   Atrial fibrillation with RVR (HCC)   Weakness   Decreased appetite   EGD performed today shows no definite cause of weight loss.  Hemoglobin has decreased slightly today, discussion with primary service and patient's family determined anticoagulation will not be started today.  Continue discussions of starting anticoagulation for CHA2DS2-VASc or 4 with paroxysmal atrial fibrillation.  Patient appears to be receiving maintenance fluids at the moment.  Would consider placing a stop time on maintenance fluids if not already performed since she appears overall euvolemic.  Cardiology will follow along for further discussions with family about anticoagulation.      For questions or updates, please contact [place] Please consult [email or web address] for contact info under        Signed, Elouise Munroe, MD  10/23/2020, 1:58 PM

## 2020-10-23 NOTE — CV Procedure (Signed)
BLE venous duplex has been completed.  Results can be found under chart review under CV PROC. 10/23/2020 1:02 PM Chelsye Suhre RVT, RDMS

## 2020-10-23 NOTE — Progress Notes (Signed)
PT Cancellation Note  Patient Details Name: Lisa [name] MRN: [id number] DOB: 1934/10/30   Cancelled Treatment:    Reason Eval/Treat Not Completed: Patient at procedure or test/unavailable   Wyona Almas, PT, DPT Acute Rehabilitation Services Pager [phone] Office [phone]    Deno Etienne 10/23/2020, 7:45 AM

## 2020-10-23 NOTE — Progress Notes (Signed)
Initial Nutrition Assessment  DOCUMENTATION CODES:   Not applicable  INTERVENTION:    Ensure Enlive po BID, each supplement provides 350 kcal and 20 grams of protein  Magic cup TID with meals, each supplement provides 290 kcal and 9 grams of protein  MVI daily   NUTRITION DIAGNOSIS:   Increased nutrient needs related to acute illness as evidenced by estimated needs.  GOAL:   Patient will meet greater than or equal to 90% of their needs  MONITOR:   PO intake,Supplement acceptance,Weight trends,I & O's,Labs  REASON FOR ASSESSMENT:   Consult Assessment of nutrition requirement/status  ASSESSMENT:   Patient with PMH significant for breast cancer, lung cancer, and gout. Presents this admission with hyponatremia and acute metabolic encephalopathy.   RD attempted to meet patient in the room x2, patient out of room each time. She is currently on regular diet without any meal completions documented. RN unsure of patients intake yesterday or today. RD to provide supplementation to maximize kcal and protein this admission.   Records indicate pt weighed 95.5 kg on 9/14 and 86.6 kg this admission (9.3% wt loss in 3 months, significant for time frame). Suspect malnutrition but unable to diagnose without NFPE.   Medications: MVI with minerals  Labs: Na 125 (L) CBG 110-126  Diet Order:   Diet Order            Diet regular Room service appropriate? Yes; Fluid consistency: Thin  Diet effective now                 EDUCATION NEEDS:   Not appropriate for education at this time  Skin:  Skin Assessment: Reviewed RN Assessment  Last BM:  PTA  Height:   Ht Readings from Last 1 Encounters:  10/23/20 5\' 7"  (1.702 m)    Weight:   Wt Readings from Last 1 Encounters:  10/23/20 89.4 kg   BMI:  Body mass index is 30.87 kg/m.  Estimated Nutritional Needs:   Kcal:  1800-2000 kcal  Protein:  90-105 grams  Fluid:  >/= 1.8 L/day  Mariana Single RD, LDN Clinical  Nutrition Pager listed in [place]

## 2020-10-23 NOTE — Op Note (Signed)
[facility] Patient Name: Lisa [name] Procedure Date : 10/23/2020 MRN: [id number] Attending MD: Carlota Raspberry. Havery Moros , MD Date of Birth: 04-18-1934 CSN: [id number] Age: 86 Admit Type: Inpatient Procedure:                Upper GI endoscopy Indications:              Anorexia, Weight loss Providers:                Remo Lipps P. Havery Moros, MD, Clyde Lundborg, RN, Elspeth Cho Tech., Technician, Sampson Si, CRNA Referring MD:              Medicines:                Monitored Anesthesia Care Complications:            No immediate complications. Estimated blood loss:                            Minimal. Estimated Blood Loss:     Estimated blood loss was minimal. Procedure:                Pre-Anesthesia Assessment:                           - Prior to the procedure, a History and Physical                            was performed, and patient medications and                            allergies were reviewed. The patient's tolerance of                            previous anesthesia was also reviewed. The risks                            and benefits of the procedure and the sedation                            options and risks were discussed with the patient.                            All questions were answered, and informed consent                            was obtained. Prior Anticoagulants: The patient has                            taken no previous anticoagulant or antiplatelet                            agents. ASA Grade Assessment: III - A patient with  severe systemic disease. After reviewing the risks                            and benefits, the patient was deemed in                            satisfactory condition to undergo the procedure.                           After obtaining informed consent, the endoscope was                            passed under direct vision. Throughout the                             procedure, the patient's blood pressure, pulse, and                            oxygen saturations were monitored continuously. The                            [id number]-H190 ([id number]) Olympus gastroscope was                            introduced through the mouth, and advanced to the                            second part of duodenum. The upper GI endoscopy was                            accomplished without difficulty. The patient                            tolerated the procedure well. Scope In: Scope Out: Findings:      Esophagogastric landmarks were identified: the Z-line was found at 36       cm, the gastroesophageal junction was found at 36 cm and the upper       extent of the gastric folds was found at 39 cm from the incisors.      A 3 cm hiatal hernia was present.      The esophagus was tortuous.      The exam of the esophagus was otherwise normal.      Patchy mildly erythematous mucosa was found in the gastric fundus and in       the proximal gastric body. No focal ulcerations noted.      The exam of the stomach was otherwise normal.      Biopsies were taken with a cold forceps in the gastric body, at the       incisura and in the gastric antrum for Helicobacter pylori testing.      A single 5 mm nodule was found in the second portion of the duodenum,       benign appearing. Biopsies were taken with a cold forceps for histology.      A focal benign lymphangiectasia was present in the second portion of the  duodenum.      The exam of the duodenum was otherwise normal. Impression:               - Esophagogastric landmarks identified.                           - 3 cm hiatal hernia.                           - Tortuous esophagus.                           - Normal esophagus otherwise                           - Mildly erythematous mucosa in the gastric fundus                            and gastric body without focal ulceration / erosion.                           - Normal stomach  otherwise - biopsies taken to rule                            out H pylori                           - Benign appearing small nodule found in the                            duodenum. Biopsied.                           - Duodenal mucosal lymphangiectasia.                           - Normal duodenum otherwise.                           No overt pathology on EGD to account for the                            patient's symptoms. Recommendation:           - Return to hospital ward for ongoing care                           - Resume previous diet.                           - Continue present medications.                           - Await pathology results.                           - Consideration for CT chest / abdomen / pelvis  given the patient's weight loss and history of                            malignancies. Preferable to do this with IV                            contrast if renal function allows, defer to primary                            team regarding this                           - Call with questions Procedure Code(s):        --- Professional ---                           [phone], Esophagogastroduodenoscopy, flexible,                            transoral; with biopsy, single or multiple Diagnosis Code(s):        --- Professional ---                           K44.9, Diaphragmatic hernia without obstruction or                            gangrene                           Q39.9, Congenital malformation of esophagus,                            unspecified                           K31.89, Other diseases of stomach and duodenum                           I89.0, Lymphedema, not elsewhere classified                           R63.0, Anorexia                           R63.4, Abnormal weight loss CPT copyright 2019 [employer]. All rights reserved. The codes documented in this report are preliminary and upon coder review may  be revised to meet  current compliance requirements. Remo Lipps P. Zollie Ellery, MD 10/23/2020 8:18:32 AM This report has been signed electronically. Number of Addenda: 0

## 2020-10-23 NOTE — Evaluation (Signed)
Occupational Therapy Evaluation Patient Details Name: Lisa [name] MRN: [id number] DOB: [birth year] Today's Date: 10/23/2020    History of Present Illness Lisa [name] is an 86 yo female with PMHx breast cancer, lung cancer, hypothyroidism, gout, who presents to the ER with progressively worsening weakness notably in her left lower extremity; Pain in RLE and  Afib with RVR. Pt with decline in the past 3 weeks after being taken off gout medication due to poor kidney function.   Clinical Impression   Pt PTA: Pt >3 weeks ago was independent; weight loss in the past few months; pt recently taken off gout medication and pt used to arthritis pain, but pt suffering from 10/10 pain. Pt currently limited by severe pain in BLEs R  > L knee area, decreased strength, and decreased ability to care for self. Grandson in room who is an MD reports that this is not her normal functioning. Pt is very active and was mostly independent prior. VSS on RA. Pt set-upA to totalA for ADL; pt declining bed mobility due to painful to touch RLE. RLE doppler performed. Pt would benefit from continued OT skilled services and appears very motivated to return to PLOF. Will adjust disposition based on progress. OT following acutely.    Follow Up Recommendations  CIR;Supervision/Assistance - 24 hour    Equipment Recommendations  3 in 1 bedside commode;Wheelchair (measurements OT);Wheelchair cushion (measurements OT)    Recommendations for Other Services PT consult;Rehab consult     Precautions / Restrictions Precautions Precautions: Fall Restrictions Weight Bearing Restrictions: No      Mobility Bed Mobility               General bed mobility comments: Pt declining due to pain    Transfers                 General transfer comment: Pt declining due to pain    Balance                                           ADL either performed or assessed with clinical judgement   ADL Overall  ADL's : Needs assistance/impaired Eating/Feeding: Set up;Bed level   Grooming: Set up;Bed level   Upper Body Bathing: Moderate assistance;Bed level   Lower Body Bathing: Total assistance;Bed level   Upper Body Dressing : Moderate assistance;Bed level   Lower Body Dressing: Total assistance;Bed level   Toilet Transfer: Total assistance;+2 for physical assistance;+2 for safety/equipment Toilet Transfer Details (indicate cue type and reason): use lift Toileting- Clothing Manipulation and Hygiene: Total assistance;+2 for physical assistance;+2 for safety/equipment Toileting - Clothing Manipulation Details (indicate cue type and reason): purewick     Functional mobility during ADLs: Total assistance;+2 for physical assistance;+2 for safety/equipment;Cueing for safety General ADL Comments: Pt limited by severe pain in BLEs R  > L knee area, decreased strength, and decreased ability to care for self.     Vision Baseline Vision/History: Wears glasses Wears Glasses: At all times Patient Visual Report: No change from baseline Vision Assessment?: No apparent visual deficits     Perception     Praxis      Pertinent Vitals/Pain Pain Assessment: Faces Faces Pain Scale: Hurts whole lot Pain Location: RLE at knee, hurts to touch Pain Descriptors / Indicators: Discomfort;Stabbing;Tender Pain Intervention(s): Monitored during session;Premedicated before session;Repositioned     Hand Dominance Right   Extremity/Trunk Assessment Upper  Extremity Assessment Upper Extremity Assessment: Generalized weakness;RUE deficits/detail;LUE deficits/detail RUE Deficits / Details: AROM, WFLs 3+/5 MM grade LUE Deficits / Details: AROM, WFLs 3+/5 MM grade   Lower Extremity Assessment Lower Extremity Assessment: Generalized weakness;RLE deficits/detail;Defer to PT evaluation;LLE deficits/detail RLE Deficits / Details: painful to touch knee; very poor ROM RLE: Unable to fully assess due to pain LLE  Deficits / Details: AAROM, limited due to pain LLE: Unable to fully assess due to pain   Cervical / Trunk Assessment Cervical / Trunk Assessment: Kyphotic   Communication Communication Communication: No difficulties   Cognition Arousal/Alertness: Awake/alert Behavior During Therapy: WFL for tasks assessed/performed Overall Cognitive Status: Within Functional Limits for tasks assessed                                 General Comments: Pt with grandson (who is an MD) in room confirming info from pt. Pt following all commands.   General Comments  Grandson in room who is an MD reports that this is not her normal functioning. Pt is very active and was mostly independent prior. VSS on RA.    Exercises Exercises: Other exercises Other Exercises Other Exercises: BUE hand squeezes x10 reps   Shoulder Instructions      Home Living Family/patient expects to be discharged to:: Private residence Living Arrangements: Children Available Help at Discharge: Family;Available PRN/intermittently (almost 24/7) Type of Home: House Home Access: Stairs to enter [employer] of Steps: 5   Home Layout: Multi-level     Bathroom Shower/Tub: Teacher, early years/pre: Standard     Home Equipment: Cane - single point;Shower seat          Prior Functioning/Environment Level of Independence: Needs assistance  Gait / Transfers Assistance Needed: in the past 3 weeks, pt has required more assist with mobility and getting OOB. Pt with recent weight loss and progressive decline in function after being taken off gout medication due to poor kidney function, ADL's / Homemaking Assistance Needed: Pt requiring increased assist for ADL, mostly LB. Pt still was bathing in bathtub and assisted with transfer. >3 weeks ago, pt was independent and driving.            OT Problem List: Decreased strength;Decreased activity tolerance;Impaired balance (sitting and/or  standing);Decreased safety awareness;Pain;Decreased range of motion;Decreased knowledge of use of DME or AE;Obesity      OT Treatment/Interventions: Self-care/ADL training;Therapeutic exercise;Energy conservation;DME and/or AE instruction;Therapeutic activities;Patient/family education;Balance training    OT Goals(Current goals can be found in the care plan section) Acute Rehab OT Goals Patient Stated Goal: to go home when less painful OT Goal Formulation: With patient Time For Goal Achievement: 11/06/20 Potential to Achieve Goals: Good ADL Goals Pt Will Perform Grooming: with supervision;sitting Pt Will Perform Lower Body Dressing: with min assist;sitting/lateral leans;sit to/from stand;with adaptive equipment Pt Will Transfer to Toilet: with min guard assist;bedside commode Pt Will Perform Toileting - Clothing Manipulation and hygiene: with min guard assist;sit to/from stand Pt Will Perform Tub/Shower Transfer: with min guard assist;Stand pivot transfer;Tub transfer Pt/caregiver will Perform Home Exercise Program: Increased strength;Both right and left upper extremity;With Supervision  OT Frequency: Min 2X/week   Barriers to D/C:            Co-evaluation              AM-PAC OT "6 Clicks" Daily Activity     Outcome Measure Help from another person eating meals?: None  Help from another person taking care of personal grooming?: A Little Help from another person toileting, which includes using toliet, bedpan, or urinal?: Total Help from another person bathing (including washing, rinsing, drying)?: Total Help from another person to put on and taking off regular upper body clothing?: A Lot Help from another person to put on and taking off regular lower body clothing?: Total 6 Click Score: 12   End of Session Nurse Communication: Mobility status  Activity Tolerance: Patient limited by pain Patient left: in bed;with call bell/phone within reach;with bed alarm set;with  family/visitor present  OT Visit Diagnosis: Unsteadiness on feet (R26.81);Muscle weakness (generalized) (M62.81);Pain                Time: 1016-1030 OT Time Calculation (min): 14 min Charges:  OT General Charges $OT Visit: 1 Visit OT Evaluation $OT Eval Moderate Complexity: 1 Mod  Jefferey Pica, OTR/L Acute Rehabilitation Services Pager: [phone] Office: [phone]  Hollyann Pablo C 10/23/2020, 3:28 PM

## 2020-10-23 NOTE — Anesthesia Preprocedure Evaluation (Signed)
Anesthesia Evaluation  Patient identified by MRN, date of birth, ID band Patient awake    Reviewed: Allergy & Precautions, NPO status , Patient's Chart, lab work & pertinent test results, reviewed documented beta blocker date and time   Airway Mallampati: II  TM Distance: >3 FB Neck ROM: Full    Dental no notable dental hx.    Pulmonary asthma , COPD,  COPD inhaler, former smoker,  Lung CA s/p resection   Pulmonary exam normal breath sounds clear to auscultation       Cardiovascular hypertension, Pt. on home beta blockers and Pt. on medications Normal cardiovascular exam+ dysrhythmias (new onset with RVR ) Atrial Fibrillation  Rhythm:Regular Rate:Normal  TTE 2021 1. Left ventricular ejection fraction, by estimation, is 65 to 70%. The  left ventricle has normal function. The left ventricle has no regional  wall motion abnormalities. There is mild left ventricular hypertrophy.  Left ventricular diastolic parameters  are consistent with Grade I diastolic dysfunction (impaired relaxation).  2. Prominent moderator band. Right ventricular systolic function is  hyperdynamic. The right ventricular size is normal. There is moderately  elevated pulmonary artery systolic pressure.  3. Left atrial size was moderately dilated.  4. The mitral valve is grossly normal. Trivial mitral valve  regurgitation.  5. The aortic valve is tricuspid. Aortic valve regurgitation is not  visualized. Mild aortic valve sclerosis is present, with no evidence of  aortic valve stenosis.  6. The inferior vena cava is normal in size with greater than 50%  respiratory variability, suggesting right atrial pressure of 3 mmHg.   Neuro/Psych negative neurological ROS  negative psych ROS   GI/Hepatic negative GI ROS, Neg liver ROS,   Endo/Other  Hypothyroidism   Renal/GU Renal InsufficiencyRenal disease (Cr 1.01)  negative genitourinary    Musculoskeletal  (+) Arthritis ,   Abdominal   Peds  Hematology  (+) Blood dyscrasia (Hgb 9.6, plt 67), anemia ,   Anesthesia Other Findings   Reproductive/Obstetrics                             Anesthesia Physical Anesthesia Plan  ASA: III  Anesthesia Plan: MAC   Post-op Pain Management:    Induction: Intravenous  PONV Risk Score and Plan: Propofol infusion and Treatment may vary due to age or medical condition  Airway Management Planned: Natural Airway  Additional Equipment:   Intra-op Plan:   Post-operative Plan:   Informed Consent: I have reviewed the patients History and Physical, chart, labs and discussed the procedure including the risks, benefits and alternatives for the proposed anesthesia with the patient or authorized representative who has indicated his/her understanding and acceptance.     Dental advisory given  Plan Discussed with: CRNA  Anesthesia Plan Comments:         Anesthesia Quick Evaluation

## 2020-10-24 ENCOUNTER — Inpatient Hospital Stay (HOSPITAL_COMMUNITY): Payer: Medicare Other

## 2020-10-24 ENCOUNTER — Other Ambulatory Visit: Payer: Self-pay | Admitting: Physician Assistant

## 2020-10-24 ENCOUNTER — Encounter: Payer: Self-pay | Admitting: Internal Medicine

## 2020-10-24 DIAGNOSIS — R627 Adult failure to thrive: Secondary | ICD-10-CM

## 2020-10-24 DIAGNOSIS — R630 Anorexia: Secondary | ICD-10-CM

## 2020-10-24 DIAGNOSIS — N1832 Chronic kidney disease, stage 3b: Secondary | ICD-10-CM | POA: Diagnosis not present

## 2020-10-24 DIAGNOSIS — N179 Acute kidney failure, unspecified: Secondary | ICD-10-CM | POA: Diagnosis not present

## 2020-10-24 LAB — CBC WITH DIFFERENTIAL/PLATELET
Abs Immature Granulocytes: 0.18 10*3/uL — ABNORMAL HIGH (ref 0.00–0.07)
Basophils Absolute: 0 10*3/uL (ref 0.0–0.1)
Basophils Relative: 0 %
Eosinophils Absolute: 0 10*3/uL (ref 0.0–0.5)
Eosinophils Relative: 0 %
HCT: 31.8 % — ABNORMAL LOW (ref 36.0–46.0)
Hemoglobin: 9.5 g/dL — ABNORMAL LOW (ref 12.0–15.0)
Immature Granulocytes: 2 %
Lymphocytes Relative: 13 %
Lymphs Abs: 1.3 10*3/uL (ref 0.7–4.0)
MCH: 29.6 pg (ref 26.0–34.0)
MCHC: 29.9 g/dL — ABNORMAL LOW (ref 30.0–36.0)
MCV: 99.1 fL (ref 80.0–100.0)
Monocytes Absolute: 6.9 10*3/uL — ABNORMAL HIGH (ref 0.1–1.0)
Monocytes Relative: 67 %
Neutro Abs: 1.8 10*3/uL (ref 1.7–7.7)
Neutrophils Relative %: 18 %
Platelets: 85 10*3/uL — ABNORMAL LOW (ref 150–400)
RBC: 3.21 MIL/uL — ABNORMAL LOW (ref 3.87–5.11)
RDW: 15.9 % — ABNORMAL HIGH (ref 11.5–15.5)
WBC: 10.2 10*3/uL (ref 4.0–10.5)
nRBC: 0.3 % — ABNORMAL HIGH (ref 0.0–0.2)

## 2020-10-24 LAB — MAGNESIUM: Magnesium: 1.4 mg/dL — ABNORMAL LOW (ref 1.7–2.4)

## 2020-10-24 LAB — BASIC METABOLIC PANEL
Anion gap: 8 (ref 5–15)
BUN: 9 mg/dL (ref 8–23)
CO2: 26 mmol/L (ref 22–32)
Calcium: 8.6 mg/dL — ABNORMAL LOW (ref 8.9–10.3)
Chloride: 95 mmol/L — ABNORMAL LOW (ref 98–111)
Creatinine, Ser: 0.95 mg/dL (ref 0.44–1.00)
GFR, Estimated: 59 mL/min — ABNORMAL LOW (ref >60)
Glucose, Bld: 97 mg/dL (ref 70–99)
Potassium: 4.1 mmol/L (ref 3.5–5.1)
Sodium: 129 mmol/L — ABNORMAL LOW (ref 135–145)

## 2020-10-24 LAB — OSMOLALITY, URINE: Osmolality, Ur: 399 mOsm/kg (ref 300–900)

## 2020-10-24 LAB — SURGICAL PATHOLOGY

## 2020-10-24 LAB — CREATININE, URINE, RANDOM: Creatinine, Urine: 94.38 mg/dL

## 2020-10-24 LAB — SODIUM, URINE, RANDOM: Sodium, Ur: 36 mmol/L

## 2020-10-24 MED ORDER — MAGNESIUM SULFATE 2 GM/50ML IV SOLN
2.0000 g | Freq: Once | INTRAVENOUS | Status: AC
  Administered 2020-10-24: 2 g via INTRAVENOUS
  Filled 2020-10-24: qty 50

## 2020-10-24 MED ORDER — IOHEXOL 300 MG/ML  SOLN
100.0000 mL | Freq: Once | INTRAMUSCULAR | Status: AC | PRN
  Administered 2020-10-24: 100 mL via INTRAVENOUS

## 2020-10-24 NOTE — Progress Notes (Signed)
Inpatient Rehabilitation Admissions Coordinator  I will place order per protocol  Danne Baxter, RN, MSN Rehab Admissions Coordinator [phone] 10/24/2020 12:05 PM

## 2020-10-24 NOTE — Progress Notes (Signed)
Inpatient Rehabilitation Admissions Coordinator  Inpatient rehab consult received. I met with patient and her grandson at bedside to discuss goals and expectations of a possible Cir admit. They prefer inpt rehab but are aware of the lack of bed availability likely over the next several days. I have recommended they pursue other rehab venue options in case CIR bed not available when medical work up complete. I have alerted acute team and TOC of need.  Danne Baxter, RN, MSN Rehab Admissions Coordinator [phone] 10/24/2020 2:29 PM

## 2020-10-24 NOTE — Evaluation (Signed)
Physical Therapy Evaluation Patient Details Name: Lisa [name] MRN: [id number] DOB: November 16, 1933 Today's Date: 10/24/2020   History of Present Illness  Lisa [name] is an 86 yo female with PMHx breast cancer, lung cancer, hypothyroidism, gout, who presents to the ER with progressively worsening weakness notably in her left lower extremity; Pain in RLE and  Afib with RVR. Pt with decline in the past 3 weeks after being taken off gout medication due to poor kidney function.    Clinical Impression  Pt admitted with above diagnosis. On eval, pt required mod assist bed mobility, max assist sit to stand from elevated surface in STEDY, and dependent transfer bed to recliner using STEDY. Mobility continues to be limited by pain BLE but it is improving. Pt currently with functional limitations due to the deficits listed below (see PT Problem List). Pt will benefit from skilled PT to increase their independence and safety with mobility to allow discharge to the venue listed below. Pt demonstrates good potential for improved therapy tolerance as her pain subsides.     Follow Up Recommendations CIR    Equipment Recommendations  Other (comment) (defer to post acute)    Recommendations for Other Services Rehab consult     Precautions / Restrictions Precautions Precautions: Fall Restrictions Weight Bearing Restrictions: No      Mobility  Bed Mobility Overal bed mobility: Needs Assistance Bed Mobility: Supine to Sit     Supine to sit: Mod assist;HOB elevated     General bed mobility comments: +rail, increased time    Transfers Overall transfer level: Needs assistance   Transfers: Sit to/from Stand Sit to Stand: Max assist;From elevated surface         General transfer comment: multi attempts to attain standing in stedy, trunk remained very flexed, increased time, cues for sequencing. Dependent transfer bed to recliner in [place].  Ambulation/Gait             General Gait  Details: unable  Stairs            Wheelchair Mobility    Modified Rankin (Stroke Patients Only)       Balance Overall balance assessment: Needs assistance Sitting-balance support: No upper extremity supported;Feet supported Sitting balance-Leahy Scale: Fair     Standing balance support: Bilateral upper extremity supported;During functional activity Standing balance-Leahy Scale: Poor Standing balance comment: reliant on external support                             Pertinent Vitals/Pain Pain Assessment: Faces Faces Pain Scale: Hurts even more Pain Location: BLE (R>L) with mobility and palpation Pain Descriptors / Indicators: Grimacing;Guarding;Discomfort;Tender Pain Intervention(s): Monitored during session;Limited activity within patient's tolerance;Repositioned    Home Living Family/patient expects to be discharged to:: Private residence Living Arrangements: Children Available Help at Discharge: Family;Available PRN/intermittently (almost 24/7) Type of Home: House Home Access: Stairs to enter Entrance Stairs-Rails: Right;Left;Can reach both Entrance Stairs-Number of Steps: 5 Home Layout: Multi-level Home Equipment: Cane - single point;Shower seat      Prior Function Level of Independence: Needs assistance   Gait / Transfers Assistance Needed: in the past 3 weeks, pt has required more assist with mobility and getting OOB. Pt with recent weight loss and progressive decline in function after being taken off gout medication due to poor kidney function,  ADL's / Homemaking Assistance Needed: Pt requiring increased assist for ADL, mostly LB. Pt still was bathing in bathtub and assisted with transfer. >3  weeks ago, pt was independent and driving.        Hand Dominance   Dominant Hand: Right    Extremity/Trunk Assessment   Upper Extremity Assessment Upper Extremity Assessment: Defer to OT evaluation    Lower Extremity Assessment Lower Extremity  Assessment: Generalized weakness;RLE deficits/detail RLE Deficits / Details: painful to touch knee; very poor ROM RLE: Unable to fully assess due to pain LLE Deficits / Details: AAROM, limited due to pain LLE: Unable to fully assess due to pain    Cervical / Trunk Assessment Cervical / Trunk Assessment: Kyphotic  Communication   Communication: No difficulties  Cognition Arousal/Alertness: Awake/alert Behavior During Therapy: WFL for tasks assessed/performed Overall Cognitive Status: Within Functional Limits for tasks assessed                                        General Comments      Exercises     Assessment/Plan    PT Assessment Patient needs continued PT services  PT Problem List Decreased strength;Decreased mobility;Decreased range of motion;Decreased activity tolerance;Pain;Decreased balance;Decreased knowledge of use of DME       PT Treatment Interventions DME instruction;Therapeutic activities;Gait training;Therapeutic exercise;Patient/family education;Balance training;Functional mobility training    PT Goals (Current goals can be found in the Care Plan section)  Acute Rehab PT Goals Patient Stated Goal: decrease pain PT Goal Formulation: With patient Time For Goal Achievement: 11/07/20 Potential to Achieve Goals: Good    Frequency Min 3X/week   Barriers to discharge        Co-evaluation               AM-PAC PT "6 Clicks" Mobility  Outcome Measure Help needed turning from your back to your side while in a flat bed without using bedrails?: A Lot Help needed moving from lying on your back to sitting on the side of a flat bed without using bedrails?: A Lot Help needed moving to and from a bed to a chair (including a wheelchair)?: Total Help needed standing up from a chair using your arms (e.g., wheelchair or bedside chair)?: A Lot Help needed to walk in hospital room?: Total Help needed climbing 3-5 steps with a railing? : Total 6  Click Score: 9    End of Session Equipment Utilized During Treatment: Gait belt Activity Tolerance: Patient limited by pain Patient left: in chair;with call bell/phone within reach;with chair alarm set Nurse Communication: Mobility status;Need for lift equipment PT Visit Diagnosis: Other abnormalities of gait and mobility (R26.89);Pain;Difficulty in walking, not elsewhere classified (R26.2);Muscle weakness (generalized) (M62.81)    Time: 7591-6384 PT Time Calculation (min) (ACUTE ONLY): 24 min   Charges:   PT Evaluation $PT Eval Moderate Complexity: 1 Mod PT Treatments $Therapeutic Activity: 8-22 mins        Lorrin Goodell, PT  Office # [phone] Pager [phone]   Lorriane Shire 10/24/2020, 11:10 AM

## 2020-10-24 NOTE — PMR Pre-admission (Signed)
PMR Admission Coordinator Pre-Admission Assessment  Patient: Lisa [name] is an 86 y.o., female MRN: [id number] DOB: 21-Dec-1933 Height: '5\' 7"'  (170.2 cm) Weight: 88.8 kg  Insurance Information HMO:     PPO:      PCP:      IPA:      80/20:      OTHER:  PRIMARY: Medicare a and b      Policy#: [id number]      Subscriber: pt Benefits:  Phone #: passport one online     Name: 12/23 Eff. Date: 03/02/2001     Deduct: $1484      Out of Pocket Max: none      Life Max: none CIR: 100%      SNF: 20 full days Outpatient: 80%     Co-Pay: 20% Home Health: 100%      Co-Pay: none DME: 80%     Co-Pay: 20% Providers: pt choice  SECONDARY: Tricare for Life      Policy#: [id number]     Phone#: pt  Financial Counselor:       Phone#:   The "Data Collection Information Summary" for patients in Inpatient Rehabilitation Facilities with attached "Privacy Act Ord Records" was provided and verbally reviewed with: Patient  Emergency Contact Information Contact Information    Name Relation Home Work [place] Daughter [phone]  [phone]   Nyja, [place] [phone]        Current Medical History  Patient Admitting Diagnosis: Debility  History of Present Illness:  86 year old right-handed female with history of breast cancer 1999 treated with lumpectomy and radiation as well as tamoxifen for 5 years, lung cancer with partial upper lobe lobectomy 2006, AKI thought secondary to ARB seen by nephrology services in the past, pancytopenia followed by hematology services in the past, hypothyroidism, quit smoking 29 years ago and gout.   Independent until approximately 3 weeks ago with progressive decrease in mobility.  Presented 10/21/2020 with generalized weakness nausea weight loss decreased oral intake over the past 3 weeks.  Admission chemistries sodium 122 glucose 121 BUN 30 creatinine 1.53, hemoglobin 10.9, urinalysis negative nitrite, urine culture no growth, uric  acid 9.5, troponin 35-41, TSH 14.355, osmolality 276.  New onset atrial fibrillation cardiology services consulted.  Echocardiogram with ejection fraction of 65 to 70% no wall motion abnormalities grade 1 diastolic dysfunction.  She was placed on beta-blocker and monitored.  Lower extremity Doppler showed no DVT.  Gastroenterology services consulted for weight loss and poor appetite.  Underwent upper GI endoscopy 10/23/2020 per Dr. Havery Moros.  No focal ulcerations noted.  There was a 3 cm hiatal hernia present.  The esophagus was tortuous.  Biopsies were taken with cold forceps in the gastric body.  H. pylori was positive started on triple therapy regimen x2 weeks total and then continue Protonix twice daily.  Orthopedic services consulted 10/26/2020 for bilateral knee joint effusions x-ray showed arthritic changes.  She did undergo joint aspiration of both knees.  Fluid analysis showed no evidence of infection but urate crystals diagnostic gouty arthritis.  She was placed on allopurinol but hold on colchicine due to history of AKI.  Her renal function has stabilized with latest creatinine 1.13 and monitored by renal services.  She is tolerating a regular diet.     Patient's medical record from [facility]  has been reviewed by the rehabilitation admission coordinator and physician.  Past Medical History  Past Medical History:  Diagnosis Date  . Adenocarcinoma of left lung ([place])  2006  . Arthritis   . Asthma   . Cancer of breast, female ([place])   . Cancer of lung ([place])   . Hypertension   . Hypothyroidism   . Invasive ductal carcinoma of left breast ([place]) 1999  . Neutropenia ([place]) 06/09/2016  . Personal history of radiation therapy     Family History   family history includes Cancer in her mother and sister.  Prior Rehab/Hospitalizations Has the patient had prior rehab or hospitalizations prior to admission? Yes  Has the patient had major surgery during 100 days prior to admission?  No   Current Medications  Current Facility-Administered Medications:  .  acetaminophen (TYLENOL) tablet 650 mg, 650 mg, Oral, Q6H, Dahal, Binaya, MD, 650 mg at 10/30/20 0548 .  albuterol (VENTOLIN HFA) 108 (90 Base) MCG/ACT inhaler 2 puff, 2 puff, Inhalation, Q6H PRN, Dwyane Dee, MD, 2 puff at 10/26/20 1234 .  allopurinol (ZYLOPRIM) tablet 200 mg, 200 mg, Oral, Daily, Corliss Parish, MD, 200 mg at 10/30/20 0909 .  amoxicillin (AMOXIL) capsule 1,000 mg, 1,000 mg, Oral, Q12H, Vena Rua, PA-C, 1,000 mg at 10/30/20 5638 .  clarithromycin (BIAXIN) tablet 500 mg, 500 mg, Oral, Q12H, Vena Rua, PA-C, 500 mg at 10/30/20 7564 .  feeding supplement (ENSURE ENLIVE / ENSURE PLUS) liquid 237 mL, 237 mL, Oral, BID BM, Dahal, Binaya, MD, 237 mL at 10/30/20 0910 .  fluticasone furoate-vilanterol (BREO ELLIPTA) 100-25 MCG/INH 1 puff, 1 puff, Inhalation, Daily, 1 puff at 10/27/20 1159 **AND** umeclidinium bromide (INCRUSE ELLIPTA) 62.5 MCG/INH 1 puff, 1 puff, Inhalation, Daily, Dwyane Dee, MD, 1 puff at 10/30/20 0739 .  furosemide (LASIX) tablet 10 mg, 10 mg, Oral, Daily, Dahal, Binaya, MD, 10 mg at 10/30/20 0909 .  levothyroxine (SYNTHROID) tablet 100 mcg, 100 mcg, Oral, Q0600, Corliss Parish, MD, 100 mcg at 10/30/20 0548 .  metoprolol tartrate (LOPRESSOR) tablet 12.5 mg, 12.5 mg, Oral, BID, Dahal, Binaya, MD, 12.5 mg at 10/30/20 0908 .  metroNIDAZOLE (FLAGYL) tablet 500 mg, 500 mg, Oral, Q12H, Vena Rua, PA-C, 500 mg at 10/30/20 0908 .  multivitamin with minerals tablet 1 tablet, 1 tablet, Oral, Daily, Dwyane Dee, MD, 1 tablet at 10/30/20 0909 .  nystatin (MYCOSTATIN) 100000 UNIT/ML suspension 500,000 Units, 5 mL, Oral, TID AC & HS, Regalado, Belkys A, MD, 500,000 Units at 10/30/20 0908 .  ondansetron (ZOFRAN) tablet 4 mg, 4 mg, Oral, Q6H PRN **OR** ondansetron (ZOFRAN) injection 4 mg, 4 mg, Intravenous, Q6H PRN, Dwyane Dee, MD .  pantoprazole (PROTONIX) EC tablet  40 mg, 40 mg, Oral, BID, Vena Rua, PA-C, 40 mg at 10/30/20 0909 .  [START ON 11/06/2020] pantoprazole (PROTONIX) EC tablet 40 mg, 40 mg, Oral, Daily, Gribbin, Sarah J, PA-C .  [COMPLETED] peg 3350 powder (MOVIPREP) kit 100 g, 0.5 kit, Oral, Once, 100 g at 10/28/20 1817 **AND** peg 3350 powder (MOVIPREP) kit 100 g, 0.5 kit, Oral, Once, Chen, Lydia D, RPH .  polyethylene glycol (MIRALAX / GLYCOLAX) packet 17 g, 17 g, Oral, Daily PRN, Dahal, Binaya, MD .  senna-docusate (Senokot-S) tablet 1 tablet, 1 tablet, Oral, QHS, Dahal, Marlowe Aschoff, MD, 1 tablet at 10/29/20 2056 .  sodium chloride flush (NS) 0.9 % injection 3 mL, 3 mL, Intravenous, Q12H, Dwyane Dee, MD, 3 mL at 10/30/20 0910  Patients Current Diet:  Diet Order            Diet regular Room service appropriate? Yes; Fluid consistency: Thin  Diet effective now  Precautions / Restrictions Precautions Precautions: Fall Precaution Comments: unsteady knees with recently injected joints Restrictions Weight Bearing Restrictions: No   Has the patient had 2 or more falls or a fall with injury in the past year? No  Prior Activity Level Community (5-7x/wk): Independent and driving until 3 weeks pta  Prior Functional Level Self Care: Did the patient need help bathing, dressing, using the toilet or eating? Independent  Indoor Mobility: Did the patient need assistance with walking from room to room (with or without device)? Independent  Stairs: Did the patient need assistance with internal or external stairs (with or without device)? Independent  Functional Cognition: Did the patient need help planning regular tasks such as shopping or remembering to take medications? Independent  Home Assistive Devices / Equipment Home Assistive Devices/Equipment: Eyeglasses Home Equipment: Cane - single point,Shower seat  Prior Device Use: Indicate devices/aids used by the patient prior to current illness, exacerbation or injury?  None of the above  Current Functional Level Cognition  Overall Cognitive Status: Within Functional Limits for tasks assessed Orientation Level: Oriented to person,Oriented to place,Oriented to situation General Comments: Pt with grandson (who is an MD) in room confirming info from pt. Pt following all commands.    Extremity Assessment (includes Sensation/Coordination)  Upper Extremity Assessment: Defer to OT evaluation RUE Deficits / Details: AROM, WFLs 3+/5 MM grade LUE Deficits / Details: AROM, WFLs 3+/5 MM grade  Lower Extremity Assessment: Generalized weakness,RLE deficits/detail RLE Deficits / Details: painful to touch knee; very poor ROM RLE: Unable to fully assess due to pain LLE Deficits / Details: AAROM, limited due to pain LLE: Unable to fully assess due to pain    ADLs  Overall ADL's : Needs assistance/impaired Eating/Feeding: Set up,Sitting Grooming: Wash/dry hands,Wash/dry face,Set up,Sitting Grooming Details (indicate cue type and reason): in recliner Upper Body Bathing: Moderate assistance,Bed level Lower Body Bathing: Total assistance,Bed level Upper Body Dressing : Moderate assistance,Bed level Lower Body Dressing: Moderate assistance,Sit to/from stand,+2 for safety/equipment Lower Body Dressing Details (indicate cue type and reason): Pt able to don and doff sock without assist from seated position - will need mod A for articles of clothing requring sit<>stand Toilet Transfer: Moderate assistance,+2 for safety/equipment,BSC,RW Toilet Transfer Details (indicate cue type and reason): pulls up on RW - needs vc for safety and instruction on how to use RW safely mod A +2 for boost Toileting- Clothing Manipulation and Hygiene: Total assistance,+2 for physical assistance,+2 for safety/equipment Toileting - Clothing Manipulation Details (indicate cue type and reason): purewick Functional mobility during ADLs: Minimal assistance,+2 for safety/equipment,Rolling walker,Cueing  for sequencing,Cueing for safety General ADL Comments: Improved pain management due to injections in Bil knees yesterday. improved access to LB for ADL from seated position. Needs significant instruction on how to use RW    Mobility  Overal bed mobility: Needs Assistance Bed Mobility: Supine to Sit Supine to sit: Min guard,HOB elevated General bed mobility comments: no physical assist needed, min guard for lines    Transfers  Overall transfer level: Needs assistance Equipment used: Rolling walker (2 wheeled) Transfer via Lift Equipment: Stedy Transfers: Sit to/from Stand Sit to Stand: Mod assist,+2 safety/equipment,+2 physical assistance General transfer comment: Pt needed a lot of education on safety and use of RW - cues for hand placement.    Ambulation / Gait / Stairs / Wheelchair Mobility  Ambulation/Gait Ambulation/Gait assistance: Herbalist (Feet): 18 Feet Assistive device: Rolling walker (2 wheeled),2 person hand held assist Gait Pattern/deviations: Step-through pattern,Step-to pattern,Decreased stride length,Wide  base of support,Trunk flexed General Gait Details: requires frequent instructions for clearing walker from obstacles and maintaining space between she and walker Gait velocity: reduced, variable Gait velocity interpretation: <1.8 ft/sec, indicate of risk for recurrent falls    Posture / Balance Balance Overall balance assessment: Needs assistance Sitting-balance support: Feet supported Sitting balance-Leahy Scale: Fair Standing balance support: Bilateral upper extremity supported,During functional activity Standing balance-Leahy Scale: Poor Standing balance comment: reliant on external support    Special needs/care consideration    Previous Home Environment  Living Arrangements:  (pt's 53 year old daughter lives with her)  Lives With: Daughter Available Help at Discharge: [facility] PRN/intermittently (almost 24/7) Type of Home:  House Home Layout: Multi-level Alternate Level Stairs-Number of Steps: 14 Home Access: Stairs to enter Entrance Stairs-Rails: Right,Left,Can reach both Entrance Stairs-Number of Steps: 5 Bathroom Shower/Tub: Chiropodist: Standard Bathroom Accessibility: Yes How Accessible: Accessible via walker Home Care Services: No  Discharge Living Setting Plans for Discharge Living Setting: Patient's home,Lives with (comment) (daughter lives with patient) Type of Home at Discharge: House Discharge Home Layout: Multi-level Alternate Level Stairs-Number of Steps: 14 Discharge Home Access: Stairs to enter Entrance Stairs-Rails: Right,Left,Can reach both Entrance Stairs-Number of Steps: 5 Discharge Bathroom Shower/Tub: Tub/shower unit Discharge Bathroom Toilet: Standard Discharge Bathroom Accessibility: Yes How Accessible: Accessible via walker Does the patient have any problems obtaining your medications?: No  Social/Family/Support Systems Contact Information: daughter , [name] and grandson, Dr. Hilton Sinclair Anticipated Caregiver: daughter Anticipated Caregiver's Contact Information: see above Ability/Limitations of Caregiver: daughter works; grandson here from [place] Availability: Intermittent Discharge Plan Discussed with Primary Caregiver: Yes Is Caregiver In Agreement with Plan?: Yes Does Caregiver/Family have Issues with Lodging/Transportation while Pt is in Rehab?: No  Goals Patient/Family Goal for Rehab: Mod I to supervision with PT and OT Expected length of stay: ELOS 10 to 14 days Pt/Family Agrees to Admission and willing to participate: Yes Program Orientation Provided & Reviewed with Pt/Caregiver Including Roles  & Responsibilities: Yes  Decrease burden of Care through IP rehab admission: n/a  Possible need for SNF placement upon discharge: not anticipated  Patient Condition: I have reviewed medical records from [facility] , spoken with   patient and family member. I met with patient at the bedside for inpatient rehabilitation assessment.  Patient will benefit from ongoing PT and OT, can actively participate in 3 hours of therapy a day 5 days of the week, and can make measurable gains during the admission.  Patient will also benefit from the coordinated team approach during an Inpatient Acute Rehabilitation admission.  The patient will receive intensive therapy as well as Rehabilitation physician, nursing, social worker, and care management interventions.  Due to bladder management, bowel management, safety, skin/wound care, disease management, medication administration, pain management and patient education the patient requires 24 hour a day rehabilitation nursing.  The patient is currently min to mod assist with mobility and basic ADLs.  Discharge setting and therapy post discharge at home with home health is anticipated.  Patient has agreed to participate in the Acute Inpatient Rehabilitation Program and will admit today.  Preadmission Screen Completed By: Danne Baxter RN MSN with updates by Cleatrice Burke, 10/30/2020 12:06 PM ______________________________________________________________________   Discussed status with Dr. Posey Pronto  on  10/30/2020 at  1208 and received approval for admission today.  Admission Coordinator: Danne Baxter RN MSN with updates by Cleatrice Burke, RN, time  1208 Date  10/30/2020   Assessment/Plan: Diagnosis: Debility 1. Does  the need for close, 24 hr/day Medical supervision in concert with the patient's rehab needs make it unreasonable for this patient to be served in a less intensive setting? Yes  2. Co-Morbidities requiring supervision/potential complications: breast cancer with lumpectomy and radiation, lung cancer with partial upper lobe lobectomy 2006, AKI, pancytopenia, tachypnea (monitor RR and O2 Sats with increased physical exertion) 3. Due to bowel management, safety, skin/wound  care, disease management, pain management and patient education, does the patient require 24 hr/day rehab nursing? Yes 4. Does the patient require coordinated care of a physician, rehab nurse, PT, OT to address physical and functional deficits in the context of the above medical diagnosis(es)? Yes Addressing deficits in the following areas: balance, endurance, locomotion, strength, transferring, bathing, dressing, toileting and psychosocial support 5. Can the patient actively participate in an intensive therapy program of at least 3 hrs of therapy 5 days a week? Yes 6. The potential for patient to make measurable gains while on inpatient rehab is excellent 7. Anticipated functional outcomes upon discharge from inpatient rehab: supervision PT, supervision OT, n/a SLP 8. Estimated rehab length of stay to reach the above functional goals is: 10-14 days. 9. Anticipated discharge destination: Home 10. Overall Rehab/Functional Prognosis: excellent   MD Signature: Delice Lesch, MD, ABPMR

## 2020-10-24 NOTE — Plan of Care (Signed)

## 2020-10-24 NOTE — Care Management Important Message (Signed)
Important Message  Patient Details  Name: Lisa [name] MRN: [id number] Date of Birth: 06/15/1934   Medicare Important Message Given:  Yes     Lisa [name] 10/24/2020, 3:01 PM

## 2020-10-24 NOTE — Progress Notes (Signed)
Progress Note   Subjective  Patient doing better this AM. Ate well. EGD yesterday positive for H pylori gastritis. CT scan pending. She denies any pain.   Objective   Vital signs in last 24 hours: Temp:  [97.4 F (36.3 C)-99.6 F (37.6 C)] 98.1 F (36.7 C) (12/23 0505) Pulse Rate:  [70-87] 73 (12/23 0814) Resp:  [19-20] 20 (12/23 0505) BP: (96-138)/(57-102) 96/57 (12/23 0814) SpO2:  [93 %] 93 % (12/23 0804) Last BM Date: 10/23/20 General:    AA female in NAD Neurologic:  Alert and oriented,  grossly normal neurologically. Psych:  Cooperative. Normal mood and affect.  Intake/Output from previous day: 12/22 0701 - 12/23 0700 In: 200 [I.V.:200] Out: -  Intake/Output this shift: Total I/O In: 3 [I.V.:3] Out: -   Lab Results: Recent Labs    10/22/20 0214 10/23/20 0058 10/24/20 0224  WBC 6.3 8.2 10.2  HGB 10.7* 9.6* 9.5*  HCT 34.8* 30.7* 31.8*  PLT 77* 67* 85*   BMET Recent Labs    10/22/20 1859 10/23/20 0058 10/24/20 0224  NA 124* 125* 129*  K 3.9 3.8 4.1  CL 91* 93* 95*  CO2 24 22 26   GLUCOSE 126* 113* 97  BUN 24* 20 9  CREATININE 1.09* 1.01* 0.95  CALCIUM 8.7* 8.4* 8.6*   LFT No results for input(s): PROT, ALBUMIN, AST, ALT, ALKPHOS, BILITOT, BILIDIR, IBILI in the last 72 hours. PT/INR No results for input(s): LABPROT, INR in the last 72 hours.  Studies/Results: ECHOCARDIOGRAM COMPLETE  Result Date: 10/22/2020    ECHOCARDIOGRAM REPORT   Patient Name:   Makinzie Klebba Date of Exam: 10/22/2020 Medical Rec #:  [id number]      Height:       67.0 in Accession #:    [id number]     Weight:       191.0 lb Date of Birth:  Aug 02, 1934      BSA:          1.983 m Patient Age:    86 years       BP:           114/58 mmHg Patient Gender: F              HR:           61 bpm. Exam Location:  Inpatient Procedure: 2D Echo, Cardiac Doppler and Color Doppler Indications:    Atrial fibrillation  History:        Patient has no prior history of Echocardiogram  examinations.                 Arrythmias:Atrial Fibrillation; Risk Factors:Hypertension.                 Breast and lung cancer, Renal failure.  Sonographer:    Dustin Flock Referring Phys: [place]  1. Left ventricular ejection fraction, by estimation, is 65 to 70%. The left ventricle has normal function. The left ventricle has no regional wall motion abnormalities. There is mild left ventricular hypertrophy. Left ventricular diastolic parameters are consistent with Grade I diastolic dysfunction (impaired relaxation).  2. Prominent moderator band. Right ventricular systolic function is hyperdynamic. The right ventricular size is normal. There is moderately elevated pulmonary artery systolic pressure.  3. Left atrial size was moderately dilated.  4. The mitral valve is grossly normal. Trivial mitral valve regurgitation.  5. The aortic valve is tricuspid. Aortic valve regurgitation is not visualized. Mild aortic valve sclerosis is present, with  no evidence of aortic valve stenosis.  6. The inferior vena cava is normal in size with greater than 50% respiratory variability, suggesting right atrial pressure of 3 mmHg. FINDINGS  Left Ventricle: Left ventricular ejection fraction, by estimation, is 65 to 70%. The left ventricle has normal function. The left ventricle has no regional wall motion abnormalities. The left ventricular internal cavity size was normal in size. There is  mild left ventricular hypertrophy. Left ventricular diastolic parameters are consistent with Grade I diastolic dysfunction (impaired relaxation). Indeterminate filling pressures. Right Ventricle: Prominent moderator band. The right ventricular size is normal. No increase in right ventricular wall thickness. Right ventricular systolic function is hyperdynamic. There is moderately elevated pulmonary artery systolic pressure. The tricuspid regurgitant velocity is 3.44 m/s, and with an assumed right atrial pressure of 3  mmHg, the estimated right ventricular systolic pressure is 16.1 mmHg. Left Atrium: Left atrial size was moderately dilated. Right Atrium: Right atrial size was normal in size. Pericardium: There is no evidence of pericardial effusion. Mitral Valve: The mitral valve is grossly normal. Trivial mitral valve regurgitation. Tricuspid Valve: The tricuspid valve is grossly normal. Tricuspid valve regurgitation is trivial. Aortic Valve: The aortic valve is tricuspid. Aortic valve regurgitation is not visualized. Mild aortic valve sclerosis is present, with no evidence of aortic valve stenosis. Aortic valve peak gradient measures 15.1 mmHg. Pulmonic Valve: The pulmonic valve was grossly normal. Pulmonic valve regurgitation is trivial. Aorta: The aortic root and ascending aorta are structurally normal, with no evidence of dilitation. Venous: The inferior vena cava is normal in size with greater than 50% respiratory variability, suggesting right atrial pressure of 3 mmHg. IAS/Shunts: No atrial level shunt detected by color flow Doppler.  LEFT VENTRICLE PLAX 2D LVIDd:         3.40 cm  Diastology LVIDs:         1.80 cm  LV e' medial:    5.66 cm/s LV PW:         1.10 cm  LV E/e' medial:  13.9 LV IVS:        1.20 cm  LV e' lateral:   8.27 cm/s LVOT diam:     2.10 cm  LV E/e' lateral: 9.5 LV SV:         64 LV SV Index:   32 LVOT Area:     3.46 cm  RIGHT VENTRICLE RV Basal diam:  3.00 cm RV S prime:     13.70 cm/s TAPSE (M-mode): 2.4 cm LEFT ATRIUM             Index       RIGHT ATRIUM           Index LA diam:        3.30 cm 1.66 cm/m  RA Area:     19.70 cm LA Vol (A2C):   92.9 ml 46.85 ml/m RA Volume:   57.50 ml  29.00 ml/m LA Vol (A4C):   81.6 ml 41.15 ml/m LA Biplane Vol: 89.9 ml 45.33 ml/m  AORTIC VALVE AV Area (Vmax): 1.56 cm AV Vmax:        194.00 cm/s AV Peak Grad:   15.1 mmHg LVOT Vmax:      87.60 cm/s LVOT Vmean:     57.100 cm/s LVOT VTI:       0.186 m  AORTA Ao Root diam: 2.60 cm MITRAL VALVE               TRICUSPID  VALVE MV Area (PHT):  3.37 cm    TR Peak grad:   47.3 mmHg MV Decel Time: 225 msec    TR Vmax:        344.00 cm/s MV E velocity: 78.50 cm/s MV A velocity: 52.80 cm/s  SHUNTS MV E/A ratio:  1.49        Systemic VTI:  0.19 m                            Systemic Diam: 2.10 cm Lyman Bishop MD Electronically signed by Lyman Bishop MD Signature Date/Time: 10/22/2020/4:36:05 PM    Final    VAS [place] LOWER EXTREMITY VENOUS (DVT)  Result Date: 10/23/2020  Lower Venous DVT Study Other Indications: BLE calf tenderness. Risk Factors: Hx of Cancer. Performing Technologist: Rogelia Rohrer  Examination Guidelines: A complete evaluation includes B-mode imaging, spectral Doppler, color Doppler, and power Doppler as needed of all accessible portions of each vessel. Bilateral testing is considered an integral part of a complete examination. Limited examinations for reoccurring indications may be performed as noted. The reflux portion of the exam is performed with the patient in reverse Trendelenburg.  +---------+---------------+---------+-----------+----------+--------------+ RIGHT    CompressibilityPhasicitySpontaneityPropertiesThrombus Aging +---------+---------------+---------+-----------+----------+--------------+ CFV      Full           Yes      Yes                                 +---------+---------------+---------+-----------+----------+--------------+ SFJ      Full                                                        +---------+---------------+---------+-----------+----------+--------------+ FV Prox  Full           Yes      Yes                                 +---------+---------------+---------+-----------+----------+--------------+ FV Mid   Full           Yes      Yes                                 +---------+---------------+---------+-----------+----------+--------------+ FV DistalFull           Yes      Yes                                  +---------+---------------+---------+-----------+----------+--------------+ PFV      Full                                                        +---------+---------------+---------+-----------+----------+--------------+ POP      Full           Yes      Yes                                 +---------+---------------+---------+-----------+----------+--------------+  PTV      Full                                                        +---------+---------------+---------+-----------+----------+--------------+ PERO     Full                                                        +---------+---------------+---------+-----------+----------+--------------+   +---------+---------------+---------+-----------+----------+--------------+ LEFT     CompressibilityPhasicitySpontaneityPropertiesThrombus Aging +---------+---------------+---------+-----------+----------+--------------+ CFV      Full           Yes      Yes                                 +---------+---------------+---------+-----------+----------+--------------+ SFJ      Full                                                        +---------+---------------+---------+-----------+----------+--------------+ FV Prox  Full           Yes      Yes                                 +---------+---------------+---------+-----------+----------+--------------+ FV Mid   Full           Yes      Yes                                 +---------+---------------+---------+-----------+----------+--------------+ FV DistalFull           Yes      Yes                                 +---------+---------------+---------+-----------+----------+--------------+ PFV      Full                                                        +---------+---------------+---------+-----------+----------+--------------+ POP      Full           Yes      Yes                                  +---------+---------------+---------+-----------+----------+--------------+ PTV      Full                                                        +---------+---------------+---------+-----------+----------+--------------+  PERO     Full                                                        +---------+---------------+---------+-----------+----------+--------------+     Summary: RIGHT: - There is no evidence of deep vein thrombosis in the lower extremity.  - A cystic structure is found in the popliteal fossa.   *See table(s) above for measurements and observations. Electronically signed by Curt Jews MD on 10/23/2020 at 8:02:53 PM.    Final        Assessment / Plan:    86 year old female with a history of lung cancer, breast cancer, questionable MDS, recent worsening of renal function, A. fib with RVR, admitted to the hospital with progressive weight loss, poor appetite/anorexia.   EGD yesterday without any high risk pathology, no malignancy or ulcers. She had some mild gastric erythema and a benign appearing duodenal nodule. Biopsies of the nodule are benign. Gastric biopsies positive for H pylori gastritis. While H pylori could be contributing to some of her symptoms, it would seem rather dramatic for this solely to be driving this presentation and do recommend further evaluation with CT imaging to ensure no malignancy. Grandson and patient in agreement. She is otherwise doing much better today and ate well.   Plan: - CT chest / abdomen / pelvis today with contrast (renal function improved) - She will warrant therapy for H pylori at some point in time, but counseled them this may be harsh on her stomach, can cause diarrhea. May be better to wait a few days and make sure she is eating better than to add this on right now. When she does get treated recommend the following regimen for 14 days: Amoxicillin 1gm BID Clarithromycin 500mg  BID Flagyl 500mg  BID Omeprazole 40mg  BID  - If CT  negative and she continues to eat well today and tomorrow AM, could discharge home in the next day or so and treat H pylori as outpatient, and follow up with Dr. Gala Romney her primary GI.  Please call with questions.  [place] Cellar, MD [facility]

## 2020-10-24 NOTE — Progress Notes (Signed)
PROGRESS NOTE  Lisa [name]  DOB: 30-Jul-1934  PCP: Celene Squibb, MD IHK:[id number]  DOA: 10/21/2020  LOS: 3 days   Chief Complaint  Patient presents with  . Weakness   Brief narrative: Patient is an 86 year old with PMH significant for breast cancer, lung cancer, hypothyroidism, gout who presented to the ED with progressive weakness, worse on the left lower extremity. She also has left lower extremity pain especially in her knee and ankle.  Patient has 76-month history of poor appetite and poor oral intake. She has had recent evaluation by hematology for pancytopenia.  Also was seen by nephrology in the office for AKI thought to be secondary to ARB, hydrochlorothiazide and meloxicam, ATN.  Evaluation in the ED patient was found to have AKI with a creatinine of 1.5, hyponatremia sodium down to 122.  New diagnosis of A. fib RVR.  For her weight loss and decreased oral intake GI has been consulted.  Subjective: Patient was seen and examined this morning. Sitting up in chair.  Not in distress.  Feels better than yesterday.  Right leg pain improving as well.  Ate a good breakfast yesterday.  Pending CT scan of chest abdomen pelvis.  Assessment/Plan: Hyponatremia:  -Likely hypovolemic based on normal serum osmolality.  However, need to rule out SIADH because of coexisting lung cancer. -Currently on normal saline which we will continue. Recent Labs  Lab 10/21/20 1123 10/21/20 2000 10/22/20 0214 10/22/20 1859 10/23/20 0058 10/24/20 0224  NA 122* 122* 124* 124* 125* 756*   Acute metabolic encephalopathy -Mental status improving.  Continue to monitor.  A fib RVR, mild elevation troponin:  -Continue with metoprolol. -Discussed with cardiology this morning, patient has high CHA2DS2-VASc score but also has significant risk factors for bleeding.  Ongoing work-up for cancer at this time.  Prior to discharge, will discuss with patient's family about the risk and benefit of  anticoagulation.  Failure to thrive, anorexia -Likely related to underlying cancer.  GI consult appreciated.   -12/22, EGD and colonoscopy normal.  Pending CT scan of chest abdomen pelvis.    Bilateral lower extremity pain -Patient has peripheral neuropathy and was taking gabapentin in the past.  It was held for few weeks prior to presentation for acute kidney injury.  Patient complains of bilateral lower extremity pain and his presentation.  Resumed gabapentin after which she is feeling much better. -Lower extremity ultrasound negative for DVT.  Elevated uric acid level History of gout -Patient has history of gout and her uric acid level is now elevated to 9.5.  She is complaining of pain in the leg and calf.  I do not see any evidence of acute gouty arthritis.  Her pain in the left knee is chronic.  In the past she required steroid injection in that knee for arthritis.  Currently does not look acutely inflamed or swollen. -Colchicine and allopurinol to continue.  Hypomagnesemia -Magnesium level low at 1.4.  Replace today. Recent Labs  Lab 10/21/20 2000 10/22/20 0214 10/22/20 1859 10/23/20 0058 10/24/20 0224  K 3.7 3.7 3.9 3.8 4.1  MG  --  1.3*  --  1.7 1.4*   AKI superimposed on CKD 3b:  Creatinine at baseline between 1.1-1.3.  Presented with an elevated creatinine of 1.53. Related to recent use of NSAID, ARB, HCTZ and hypovolemia.  Creatinine has improved with IV fluids. Recent Labs    12/19/19 1051 02/26/20 1049 07/09/20 1050 10/21/20 1123 10/21/20 2000 10/22/20 0214 10/22/20 1859 10/23/20 0058 10/24/20 0224  BUN 22  28* 30* 30* 27* 25* 24* 20 9  CREATININE 1.23* 1.33* 1.32* 1.53* 1.17* 1.26* 1.09* 1.01* 0.95   Hypothyroidism:  Continue with Synthroid.   Thrombocytopenia:  -Worsening.  Continue to monitor. -Repeat B 12.  Recent Labs  Lab 10/21/20 1123 10/22/20 0214 10/23/20 0058 10/24/20 0224  PLT 92*  89* 77* 67* 85*   Mobility: PT eval appreciated.   CIR recommended. Code Status:   Code Status: Full Code  Nutritional status: Body mass index is 30.87 kg/m. Nutrition Problem: Increased nutrient needs Etiology: acute illness Signs/Symptoms: estimated needs Diet Order            Diet regular Room service appropriate? Yes; Fluid consistency: Thin  Diet effective now                 DVT prophylaxis: Place and maintain sequential compression device Start: 10/22/20 1202   Antimicrobials:  None Fluid: No IV fluid Consultants: None Family Communication:  Grandson at bedside.  Status is: Inpatient  Remains inpatient appropriate because: ongoing workup  Dispo: The patient is from: Home              Anticipated d/c is to: Rehab              Anticipated d/c date is: 2 days              Patient currently is not medically stable to d/c.   Infusions:    Scheduled Meds: . acetaminophen  650 mg Oral Q6H  . allopurinol  100 mg Oral Daily  . colchicine  0.3 mg Oral Daily  . feeding supplement  237 mL Oral BID BM  . ferrous sulfate  325 mg Oral Q breakfast  . fluticasone furoate-vilanterol  1 puff Inhalation Daily   And  . umeclidinium bromide  1 puff Inhalation Daily  . gabapentin  300 mg Oral TID  . levothyroxine  88 mcg Oral Q0600  . metoprolol succinate  50 mg Oral Daily  . multivitamin with minerals  1 tablet Oral Daily  . nystatin  5 mL Oral TID AC & HS  . sodium chloride flush  3 mL Intravenous Q12H    Antimicrobials: Anti-infectives (From admission, onward)   None      PRN meds: albuterol, ondansetron **OR** ondansetron (ZOFRAN) IV, traMADol   Objective: Vitals:   10/24/20 0804 10/24/20 0814  BP:  (!) 96/57  Pulse:  73  Resp:    Temp:    SpO2: 93%     Intake/Output Summary (Last 24 hours) at 10/24/2020 1421 Last data filed at 10/24/2020 0827 Gross per 24 hour  Intake 3 ml  Output --  Net 3 ml   Filed Weights   10/21/20 1121 10/22/20 1540 10/23/20 0705  Weight: 86.6 kg 89.4 kg 89.4 kg   Weight  change: 0 kg Body mass index is 30.87 kg/m.   Physical Exam: General exam: Pleasant, elderly African-American female. Not in distress Skin: No rashes, lesions or ulcers. HEENT: Atraumatic, normocephalic, no obvious bleeding Lungs: Clear to auscultation bilaterally CVS: Regular rate and rhythm, no murmur GI/Abd soft, nontender, nondistended, bowel sound present CNS: Alert, awake, oriented to place and person Psychiatry: Mood appropriate Extremities: No pedal edema, no calf tenderness.  Left knee slightly tender.  Data Review: I have personally reviewed the laboratory data and studies available.  Recent Labs  Lab 10/21/20 1123 10/22/20 0214 10/23/20 0058 10/24/20 0224  WBC 9.7  9.5 6.3 8.2 10.2  NEUTROABS 1.9 1.4* 2.0 1.8  HGB 10.9*  10.9* 10.7* 9.6* 9.5*  HCT 35.5*  35.6* 34.8* 30.7* 31.8*  MCV 93.9  93.2 94.8 95.0 99.1  PLT 92*  89* 77* 67* 85*   Recent Labs  Lab 10/21/20 2000 10/22/20 0214 10/22/20 1859 10/23/20 0058 10/24/20 0224  NA 122* 124* 124* 125* 129*  K 3.7 3.7 3.9 3.8 4.1  CL 90* 91* 91* 93* 95*  CO2 22 21* 24 22 26   GLUCOSE 110* 125* 126* 113* 97  BUN 27* 25* 24* 20 9  CREATININE 1.17* 1.26* 1.09* 1.01* 0.95  CALCIUM 8.9 9.2 8.7* 8.4* 8.6*  MG  --  1.3*  --  1.7 1.4*   F/u labs ordered.  Signed, Terrilee Croak, MD [facility] 10/24/2020

## 2020-10-25 ENCOUNTER — Inpatient Hospital Stay (HOSPITAL_COMMUNITY): Payer: Medicare Other

## 2020-10-25 DIAGNOSIS — N179 Acute kidney failure, unspecified: Secondary | ICD-10-CM | POA: Diagnosis not present

## 2020-10-25 DIAGNOSIS — I714 Abdominal aortic aneurysm, without rupture: Secondary | ICD-10-CM

## 2020-10-25 DIAGNOSIS — N1832 Chronic kidney disease, stage 3b: Secondary | ICD-10-CM | POA: Diagnosis not present

## 2020-10-25 LAB — CBC WITH DIFFERENTIAL/PLATELET
Abs Immature Granulocytes: 0.16 10*3/uL — ABNORMAL HIGH (ref 0.00–0.07)
Basophils Absolute: 0 10*3/uL (ref 0.0–0.1)
Basophils Relative: 0 %
Eosinophils Absolute: 0 10*3/uL (ref 0.0–0.5)
Eosinophils Relative: 0 %
HCT: 25.9 % — ABNORMAL LOW (ref 36.0–46.0)
Hemoglobin: 8.1 g/dL — ABNORMAL LOW (ref 12.0–15.0)
Immature Granulocytes: 2 %
Lymphocytes Relative: 17 %
Lymphs Abs: 1.6 10*3/uL (ref 0.7–4.0)
MCH: 29.7 pg (ref 26.0–34.0)
MCHC: 31.3 g/dL (ref 30.0–36.0)
MCV: 94.9 fL (ref 80.0–100.0)
Monocytes Absolute: 6.3 10*3/uL — ABNORMAL HIGH (ref 0.1–1.0)
Monocytes Relative: 66 %
Neutro Abs: 1.5 10*3/uL — ABNORMAL LOW (ref 1.7–7.7)
Neutrophils Relative %: 15 %
Platelets: 101 10*3/uL — ABNORMAL LOW (ref 150–400)
RBC: 2.73 MIL/uL — ABNORMAL LOW (ref 3.87–5.11)
RDW: 15.8 % — ABNORMAL HIGH (ref 11.5–15.5)
WBC: 9.5 10*3/uL (ref 4.0–10.5)
nRBC: 0 % (ref 0.0–0.2)

## 2020-10-25 LAB — BASIC METABOLIC PANEL
Anion gap: 9 (ref 5–15)
BUN: 23 mg/dL (ref 8–23)
CO2: 22 mmol/L (ref 22–32)
Calcium: 8.1 mg/dL — ABNORMAL LOW (ref 8.9–10.3)
Chloride: 90 mmol/L — ABNORMAL LOW (ref 98–111)
Creatinine, Ser: 1.35 mg/dL — ABNORMAL HIGH (ref 0.44–1.00)
GFR, Estimated: 39 mL/min — ABNORMAL LOW (ref >60)
Glucose, Bld: 99 mg/dL (ref 70–99)
Potassium: 3.9 mmol/L (ref 3.5–5.1)
Sodium: 121 mmol/L — ABNORMAL LOW (ref 135–145)

## 2020-10-25 LAB — C-REACTIVE PROTEIN: CRP: 24.7 mg/dL — ABNORMAL HIGH (ref <1.0)

## 2020-10-25 LAB — SEDIMENTATION RATE: Sed Rate: 120 mm/hr — ABNORMAL HIGH (ref 0–22)

## 2020-10-25 LAB — MAGNESIUM: Magnesium: 1.7 mg/dL (ref 1.7–2.4)

## 2020-10-25 LAB — FERRITIN: Ferritin: 939 ng/mL — ABNORMAL HIGH (ref 11–307)

## 2020-10-25 MED ORDER — METRONIDAZOLE 500 MG PO TABS
500.0000 mg | ORAL_TABLET | Freq: Two times a day (BID) | ORAL | Status: DC
  Administered 2020-10-25 – 2020-10-30 (×11): 500 mg via ORAL
  Filled 2020-10-25 (×11): qty 1

## 2020-10-25 MED ORDER — COLCHICINE 0.3 MG HALF TABLET
0.3000 mg | ORAL_TABLET | Freq: Every day | ORAL | Status: DC
  Administered 2020-10-25: 0.3 mg via ORAL
  Filled 2020-10-25 (×2): qty 1

## 2020-10-25 MED ORDER — ENOXAPARIN SODIUM 100 MG/ML ~~LOC~~ SOLN
1.0000 mg/kg | Freq: Two times a day (BID) | SUBCUTANEOUS | Status: DC
  Administered 2020-10-25: 90 mg via SUBCUTANEOUS
  Filled 2020-10-25 (×2): qty 1

## 2020-10-25 MED ORDER — PANTOPRAZOLE SODIUM 40 MG PO TBEC
40.0000 mg | DELAYED_RELEASE_TABLET | Freq: Every day | ORAL | Status: DC
  Administered 2020-10-25: 40 mg via ORAL
  Filled 2020-10-25: qty 1

## 2020-10-25 MED ORDER — LEVOTHYROXINE SODIUM 100 MCG PO TABS
100.0000 ug | ORAL_TABLET | Freq: Every day | ORAL | Status: DC
  Administered 2020-10-26 – 2020-10-30 (×5): 100 ug via ORAL
  Filled 2020-10-25 (×5): qty 1

## 2020-10-25 MED ORDER — PANTOPRAZOLE SODIUM 40 MG PO TBEC
40.0000 mg | DELAYED_RELEASE_TABLET | Freq: Two times a day (BID) | ORAL | Status: DC
  Administered 2020-10-25 – 2020-10-30 (×10): 40 mg via ORAL
  Filled 2020-10-25 (×10): qty 1

## 2020-10-25 MED ORDER — SODIUM CHLORIDE 0.9 % IV SOLN: INTRAVENOUS | Status: DC

## 2020-10-25 MED ORDER — ALLOPURINOL 100 MG PO TABS
200.0000 mg | ORAL_TABLET | Freq: Every day | ORAL | Status: DC
  Administered 2020-10-26 – 2020-10-30 (×5): 200 mg via ORAL
  Filled 2020-10-25 (×6): qty 2

## 2020-10-25 MED ORDER — AMOXICILLIN 500 MG PO CAPS
1000.0000 mg | ORAL_CAPSULE | Freq: Two times a day (BID) | ORAL | Status: DC
  Administered 2020-10-25 – 2020-10-30 (×11): 1000 mg via ORAL
  Filled 2020-10-25 (×13): qty 2

## 2020-10-25 MED ORDER — FUROSEMIDE 20 MG PO TABS
10.0000 mg | ORAL_TABLET | Freq: Every day | ORAL | Status: DC
  Administered 2020-10-25 – 2020-10-26 (×2): 10 mg via ORAL
  Filled 2020-10-25 (×2): qty 1

## 2020-10-25 MED ORDER — TRAMADOL HCL 50 MG PO TABS
50.0000 mg | ORAL_TABLET | Freq: Two times a day (BID) | ORAL | Status: DC
  Administered 2020-10-25: 50 mg via ORAL
  Filled 2020-10-25: qty 1

## 2020-10-25 MED ORDER — CLARITHROMYCIN 500 MG PO TABS
500.0000 mg | ORAL_TABLET | Freq: Two times a day (BID) | ORAL | Status: DC
  Administered 2020-10-25 – 2020-10-26 (×4): 500 mg via ORAL
  Filled 2020-10-25 (×6): qty 1

## 2020-10-25 MED ORDER — MAGNESIUM SULFATE 2 GM/50ML IV SOLN
2.0000 g | Freq: Once | INTRAVENOUS | Status: AC
  Administered 2020-10-25: 2 g via INTRAVENOUS
  Filled 2020-10-25: qty 50

## 2020-10-25 NOTE — Progress Notes (Signed)
Progress Note  Patient Name: Lisa [name] Date of Encounter: 10/25/2020  Primary Cardiologist: No primary care provider on file.   Subjective   No complaints. Remains in sinus rhythm with occasional PVC's.  Inpatient Medications    Scheduled Meds: . acetaminophen  650 mg Oral Q6H  . allopurinol  100 mg Oral Daily  . colchicine  0.3 mg Oral Daily  . feeding supplement  237 mL Oral BID BM  . ferrous sulfate  325 mg Oral Q breakfast  . fluticasone furoate-vilanterol  1 puff Inhalation Daily   And  . umeclidinium bromide  1 puff Inhalation Daily  . gabapentin  300 mg Oral TID  . levothyroxine  88 mcg Oral Q0600  . metoprolol succinate  50 mg Oral Daily  . multivitamin with minerals  1 tablet Oral Daily  . nystatin  5 mL Oral TID AC & HS  . sodium chloride flush  3 mL Intravenous Q12H   Continuous Infusions:  PRN Meds: albuterol, ondansetron **OR** ondansetron (ZOFRAN) IV, traMADol   Vital Signs    Vitals:   10/24/20 1530 10/24/20 2115 10/25/20 0524 10/25/20 0754  BP: (!) 92/44 (!) 102/54 105/78 (!) 101/52  Pulse: 73 82 78 81  Resp: 20 18 17 18   Temp:  98 F (36.7 C) 98 F (36.7 C)   TempSrc:  Oral Oral   SpO2: 100% 100% 95% 90%  Weight:   88.8 kg   Height:        Intake/Output Summary (Last 24 hours) at 10/25/2020 0858 Last data filed at 10/24/2020 1800 Gross per 24 hour  Intake 800 ml  Output 450 ml  Net 350 ml   Filed Weights   10/22/20 1540 10/23/20 0705 10/25/20 0524  Weight: 89.4 kg 89.4 kg 88.8 kg    Telemetry    SR rates 70s, occasional PVC's- Personally Reviewed  ECG    No new - Personally Reviewed  Physical Exam   GEN: No acute distress.   Neck: No JVD Cardiac: regular rhythm, normal rate, no murmurs, rubs, or gallops.  Respiratory: Clear to auscultation bilaterally. GI: Soft, nontender, non-distended  MS: trace bilateral edema; No deformity. Neuro:  Nonfocal  Psych: Normal affect   Labs    Chemistry Recent Labs  Lab  10/21/20 1123 10/21/20 2000 10/23/20 0058 10/24/20 0224 10/25/20 0325  NA 122*   < > 125* 129* 121*  K 3.8   < > 3.8 4.1 3.9  CL 88*   < > 93* 95* 90*  CO2 21*   < > 22 26 22   GLUCOSE 121*   < > 113* 97 99  BUN 30*   < > 20 9 23   CREATININE 1.53*   < > 1.01* 0.95 1.35*  CALCIUM 9.4   < > 8.4* 8.6* 8.1*  PROT 8.1  --   --   --   --   ALBUMIN 3.3*  --   --   --   --   AST 20  --   --   --   --   ALT 13  --   --   --   --   ALKPHOS 36*  --   --   --   --   BILITOT 1.5*  --   --   --   --   GFRNONAA 33*   < > 55* 59* 39*  ANIONGAP 13   < > 10 8 9    < > = values in this interval not  displayed.     Hematology Recent Labs  Lab 10/23/20 0058 10/24/20 0224 10/25/20 0325  WBC 8.2 10.2 9.5  RBC 3.23* 3.21* 2.73*  HGB 9.6* 9.5* 8.1*  HCT 30.7* 31.8* 25.9*  MCV 95.0 99.1 94.9  MCH 29.7 29.6 29.7  MCHC 31.3 29.9* 31.3  RDW 15.6* 15.9* 15.8*  PLT 67* 85* 101*    Cardiac EnzymesNo results for input(s): TROPONINI in the last 168 hours. No results for input(s): TROPIPOC in the last 168 hours.   BNPNo results for input(s): BNP, PROBNP in the last 168 hours.   DDimer No results for input(s): DDIMER in the last 168 hours.   Radiology    CT CHEST ABDOMEN PELVIS W CONTRAST  Result Date: 10/24/2020 CLINICAL DATA:  Poor appetite, personal history of breast cancer and lung cancer EXAM: CT CHEST, ABDOMEN, AND PELVIS WITH CONTRAST TECHNIQUE: Multidetector CT imaging of the chest, abdomen and pelvis was performed following the standard protocol during bolus administration of intravenous contrast. CONTRAST:  142mL OMNIPAQUE IOHEXOL 300 MG/ML  SOLN COMPARISON:  06/13/2019 FINDINGS: CT CHEST FINDINGS Cardiovascular: The heart is mildly enlarged without pericardial effusion. Extensive atherosclerosis throughout the coronary vasculature. The aorta is normal in caliber. There is extensive atherosclerosis of the aortic arch, with moderate stenosis of the origin of the left subclavian artery.  Mediastinum/Nodes: Stable subcentimeter left hilar lymph nodes. No pathologic adenopathy. Thyroid, trachea, and esophagus are unremarkable. Lungs/Pleura: Stable postsurgical changes from partial left upper lobe resection. Upper lobe predominant emphysema. Bibasilar scarring. No airspace disease, effusion, or pneumothorax. Musculoskeletal: No acute or destructive bony lesions. Reconstructed images demonstrate no additional findings. CT ABDOMEN PELVIS FINDINGS Hepatobiliary: No focal liver abnormality is seen. No gallstones, gallbladder wall thickening, or biliary dilatation. Pancreas: Unremarkable. No pancreatic ductal dilatation or surrounding inflammatory changes. Spleen: Normal in size without focal abnormality. Adrenals/Urinary Tract: Adrenal glands are unremarkable. Kidneys are normal, without renal calculi, focal lesion, or hydronephrosis. Bladder is unremarkable. Stomach/Bowel: No bowel obstruction or ileus. Diverticulosis of the sigmoid colon without diverticulitis. Normal appendix right lower quadrant. No bowel wall thickening or inflammatory change. Vascular/Lymphatic: Infrarenal abdominal aortic aneurysm measures up to 4.8 cm in diameter, previously having measured 4.1 cm on 05/31/2017 exam. Significant atherosclerosis and mural thrombus throughout the aorta. No pathologic adenopathy. Reproductive: Uterus and bilateral adnexa are unremarkable. Other: No free fluid or free gas. No abdominal wall hernia. Musculoskeletal: No acute or destructive bony lesions. Reconstructed images demonstrate no additional findings. IMPRESSION: 1. Stable postsurgical changes from partial left upper lobe resection. 2. Infrarenal abdominal aortic aneurysm measures up to 4.8 cm in diameter, previously having measured 4.1 cm on 05/31/2017 exam. Recommend follow-up CT/MR every 6 months and vascular consultation. This recommendation follows ACR consensus guidelines: White Paper of the ACR Incidental Findings Committee II on Vascular  Findings. J Am Coll Radiol 2013; 10:789-794. 3. Diverticulosis without diverticulitis. 4. Aortic Atherosclerosis (ICD10-I70.0) and Emphysema (ICD10-J43.9). Electronically Signed   By: Randa Ngo M.D.   On: 10/24/2020 18:00   VAS [place] LOWER EXTREMITY VENOUS (DVT)  Result Date: 10/23/2020  Lower Venous DVT Study Other Indications: BLE calf tenderness. Risk Factors: Hx of Cancer. Performing Technologist: Rogelia Rohrer  Examination Guidelines: A complete evaluation includes B-mode imaging, spectral Doppler, color Doppler, and power Doppler as needed of all accessible portions of each vessel. Bilateral testing is considered an integral part of a complete examination. Limited examinations for reoccurring indications may be performed as noted. The reflux portion of the exam is performed with the patient in reverse  Trendelenburg.  +---------+---------------+---------+-----------+----------+--------------+ RIGHT    CompressibilityPhasicitySpontaneityPropertiesThrombus Aging +---------+---------------+---------+-----------+----------+--------------+ CFV      Full           Yes      Yes                                 +---------+---------------+---------+-----------+----------+--------------+ SFJ      Full                                                        +---------+---------------+---------+-----------+----------+--------------+ FV Prox  Full           Yes      Yes                                 +---------+---------------+---------+-----------+----------+--------------+ FV Mid   Full           Yes      Yes                                 +---------+---------------+---------+-----------+----------+--------------+ FV DistalFull           Yes      Yes                                 +---------+---------------+---------+-----------+----------+--------------+ PFV      Full                                                         +---------+---------------+---------+-----------+----------+--------------+ POP      Full           Yes      Yes                                 +---------+---------------+---------+-----------+----------+--------------+ PTV      Full                                                        +---------+---------------+---------+-----------+----------+--------------+ PERO     Full                                                        +---------+---------------+---------+-----------+----------+--------------+   +---------+---------------+---------+-----------+----------+--------------+ LEFT     CompressibilityPhasicitySpontaneityPropertiesThrombus Aging +---------+---------------+---------+-----------+----------+--------------+ CFV      Full           Yes      Yes                                 +---------+---------------+---------+-----------+----------+--------------+  SFJ      Full                                                        +---------+---------------+---------+-----------+----------+--------------+ FV Prox  Full           Yes      Yes                                 +---------+---------------+---------+-----------+----------+--------------+ FV Mid   Full           Yes      Yes                                 +---------+---------------+---------+-----------+----------+--------------+ FV DistalFull           Yes      Yes                                 +---------+---------------+---------+-----------+----------+--------------+ PFV      Full                                                        +---------+---------------+---------+-----------+----------+--------------+ POP      Full           Yes      Yes                                 +---------+---------------+---------+-----------+----------+--------------+ PTV      Full                                                         +---------+---------------+---------+-----------+----------+--------------+ PERO     Full                                                        +---------+---------------+---------+-----------+----------+--------------+     Summary: RIGHT: - There is no evidence of deep vein thrombosis in the lower extremity.  - A cystic structure is found in the popliteal fossa.   *See table(s) above for measurements and observations. Electronically signed by Curt Jews MD on 10/23/2020 at 8:02:53 PM.    Final     Cardiac Studies   LVEF 65 to 76%, grade 1 diastolic dysfunction, normal RV size and function, moderately elevated pulmonary artery systolic pressure, no hemodynamically significant valvular heart disease.  Patient Profile     86 y.o. female with progressive weakness with unknown etiology, weight loss, hypertension, who we are seeing for atrial fibrillation with rapid ventricular response.  Assessment & Plan   Principal Problem:   Acute renal failure  superimposed on stage 3b chronic kidney disease ([place]) Active Problems:   Gout   Hypothyroidism   HTN (hypertension)   Weight loss   Generalized muscle weakness   Osteoarthritis   Atrial fibrillation with RVR (HCC)   Weakness   Decreased appetite   FTT (failure to thrive) in adult  Decrease in hemoglobin today (From 9.5 to 8.1) - recent EGD by GI was unrevealing for ulcer, but notable for H. Pylori gastritis.  CT C/A/P demonstrated an infrarenal AAA of 4.8 cm (was 4.1 cm in 2018) - this will need to be re-imaged in 6 months by CT angiogram or MRI. There was also some diverticulosis - which could be a source of bleeding. D/w Hospitalist today - plan for CIR - possibly Monday. He will have ongoing discussion regarding anticoagulation with the patient's grandson who is a medical resident in [place].  For questions or updates, please contact [employer] Please consult [email or web address] for contact info under   Pixie Casino, MD, FACC,  [place] Director of the [facility]  Card[facility] Diplomate of the American Board of Clinical Lipidology Attending Cardiologist  Direct Dial: [phone]  Fax: [phone]  Website:  [email or web address]  10/25/2020, 8:58 AM

## 2020-10-25 NOTE — Consult Note (Signed)
[facility] Renal Consultation Note  Requesting MD: Dahal Indication for Consultation: crt bump and hyponatremia  HPI:  Lisa [name] is a 86 y.o. female with past medical history significant for remote breast and lung CA successfully treated, hypothyroidism and gout.  She presented to the hospital on 12/20 with complaints of progressive weakness, LE pain and recent 20 pound weight loss- FTT with recent functional status very good.  Recent medical history for pancytopenia seeming to improve with b12 but were still watching for BMbx need-  Also reportedly had recent AKI felt due to ARB/hct and mobic and volume depletion- crt 1.3 as bad as 2.2  Before these meds were stopped  Upon admit to hospital- cr 1.5 (better) and sodium 122 (was 136 in Sept)-  Work up included serum osm 276- urine osm 399, urine sodium 36- TSH 14.3, cortisol OK. Seems like was given fluids-  Sodium improved to 125 on 12/22- then 129 pm 12/23-   Then was noted to be 121 this AM.  Renal function improved from 1.5 upon admit to 0.95 on 12/23 but was 1.35 today after a CT with IV contrast.   Pt is interviewed-  She says she is better- grandson at bedside is medical resident in [place]- tells me recently pt felt to possibly have PMR but ESR was OK-  Treated with short course steroids -  Also gout- he thinks she was doing better on colchicine- unclear if pain she is having is gout pain   Creatinine, Ser  Date/Time Value Ref Range Status  10/25/2020 03:25 AM 1.35 (H) 0.44 - 1.00 mg/dL Final  10/24/2020 02:24 AM 0.95 0.44 - 1.00 mg/dL Final  10/23/2020 12:58 AM 1.01 (H) 0.44 - 1.00 mg/dL Final  10/22/2020 06:59 PM 1.09 (H) 0.44 - 1.00 mg/dL Final  10/22/2020 02:14 AM 1.26 (H) 0.44 - 1.00 mg/dL Final  10/21/2020 08:00 PM 1.17 (H) 0.44 - 1.00 mg/dL Final  10/21/2020 11:23 AM 1.53 (H) 0.44 - 1.00 mg/dL Final  07/09/2020 10:50 AM 1.32 (H) 0.44 - 1.00 mg/dL Final  02/26/2020 10:49 AM 1.33 (H) 0.44 - 1.00 mg/dL Final   12/19/2019 10:51 AM 1.23 (H) 0.44 - 1.00 mg/dL Final  10/05/2019 12:20 PM 1.16 (H) 0.44 - 1.00 mg/dL Final  08/10/2019 10:36 AM 1.11 (H) 0.44 - 1.00 mg/dL Final  06/15/2019 12:14 PM 1.11 (H) 0.44 - 1.00 mg/dL Final  04/26/2019 12:30 PM 1.06 (H) 0.44 - 1.00 mg/dL Final  03/13/2019 02:51 PM 1.08 (H) 0.44 - 1.00 mg/dL Final  06/01/2017 05:49 AM 1.19 (H) 0.44 - 1.00 mg/dL Final  05/31/2017 02:08 PM 0.99 0.44 - 1.00 mg/dL Final  06/09/2016 12:21 PM 1.08 (H) 0.44 - 1.00 mg/dL Final  05/23/2014 11:28 AM 1.13 (H) 0.50 - 1.10 mg/dL Final  05/19/2012 09:17 AM 0.91 0.50 - 1.10 mg/dL Final  05/18/2011 09:36 AM 0.81 0.50 - 1.10 mg/dL Final  05/14/2010 10:11 AM 0.95 0.4 - 1.2 mg/dL Final  10/16/2009 03:50 PM 0.94 0.4 - 1.2 mg/dL Final  08/23/2009 12:42 PM 0.85 0.4 - 1.2 mg/dL Final  10/01/2008 11:12 AM 0.90  Final  04/02/2008 10:49 AM 0.91  Final     PMHx:   Past Medical History:  Diagnosis Date  . Adenocarcinoma of left lung ([place]) 2006  . Arthritis   . Asthma   . Cancer of breast, female ([place])   . Cancer of lung ([place])   . Hypertension   . Hypothyroidism   . Invasive ductal carcinoma of left breast ([place]) 1999  .  Neutropenia ([place]) 06/09/2016  . Personal history of radiation therapy     Past Surgical History:  Procedure Laterality Date  . BIOPSY  10/23/2020   Procedure: BIOPSY;  Surgeon: Yetta Flock, MD;  Location: [facility] ENDOSCOPY;  Service: Gastroenterology;;  . BREAST BIOPSY     left axillary node dissection  . BREAST LUMPECTOMY Left   . COLONOSCOPY  03/08/2003   KPT:[id number] rectal polyps destroyed with the tip of the snare/Polyps at hepatic flexure, splenic flexure at 35 cm/Left-sided diverticula: unable to retrieve path  . COLONOSCOPY  09/12/2008   ZGY:FVCBSW rectum and distal sigmoid diminutive polyps/scattered left sided diverticulum. hyperplastic  . COLONOSCOPY N/A 03/06/2013   HQP:RFFMBWG polyp-removed as described above; colonic diverticulosis. hyperplastic polyps.  next TCS 03/2018  . ESOPHAGOGASTRODUODENOSCOPY (EGD) WITH PROPOFOL N/A 10/23/2020   Procedure: ESOPHAGOGASTRODUODENOSCOPY (EGD) WITH PROPOFOL;  Surgeon: Yetta Flock, MD;  Location: [facility];  Service: Gastroenterology;  Laterality: N/A;  . FOOT SURGERY    . LUNG REMOVAL, PARTIAL     upper lobe    Family Hx:  Family History  Problem Relation Age of Onset  . Cancer Mother   . Cancer Sister   . Colon cancer Neg Hx     Social History:  reports that she quit smoking about 28 years ago. She has a 26.25 pack-year smoking history. She has never used smokeless tobacco. She reports that she does not drink alcohol and does not use drugs.  Allergies:  Allergies  Allergen Reactions  . Meloxicam Other (See Comments)    Caused an injury to the kidneys, per nephrologist  . Micardis Hct [Telmisartan-Hctz] Other (See Comments)    Caused an injury to the kidneys, per nephrologist    Medications: Prior to Admission medications   Medication Sig Start Date End Date Taking? Authorizing Provider  clobetasol ointment (TEMOVATE) 6.65 % Apply 1 application topically as needed (for irritation). 02/14/19  Yes [provider]  Iron-FA-B Cmp-C-Biot-Probiotic (FUSION PLUS) CAPS Take 1 capsule by mouth daily.   Yes [provider]  albuterol (PROVENTIL,VENTOLIN) 90 MCG/ACT inhaler Inhale 2 puffs into the lungs every 6 (six) hours as needed for wheezing or shortness of breath.    Everardo All, MD  ferrous sulfate 325 (65 FE) MG tablet Take 325 mg by mouth daily with breakfast.    [provider]  levothyroxine (SYNTHROID, LEVOTHROID) 88 MCG tablet Take 88 mcg by mouth daily before breakfast.    [provider]  metoprolol succinate (TOPROL-XL) 50 MG 24 hr tablet Take 50 mg by mouth daily. Take with or immediately following a meal.    [provider]  Multiple Vitamin (MULTIVITAMIN WITH MINERALS) TABS tablet Take 1 tablet by mouth daily.    [provider]  TRELEGY ELLIPTA 100-62.5-25 MCG/INH AEPB Inhale 1 puff into the lungs daily.  01/04/19   [provider]    I have reviewed the patient's current medications.  Labs:  Results for orders placed or performed during the hospital encounter of 10/21/20 (from the past 48 hour(s))  Basic metabolic panel     Status: Abnormal   Collection Time: 10/24/20  2:24 AM  Result Value Ref Range   Sodium 129 (L) 135 - 145 mmol/L   Potassium 4.1 3.5 - 5.1 mmol/L   Chloride 95 (L) 98 - 111 mmol/L   CO2 26 22 - 32 mmol/L   Glucose, Bld 97 70 - 99 mg/dL    Comment: Glucose reference range applies only to samples taken after  fasting for at least 8 hours.   BUN 9 8 - 23 mg/dL   Creatinine, Ser 0.95 0.44 - 1.00 mg/dL   Calcium 8.6 (L) 8.9 - 10.3 mg/dL   GFR, Estimated 59 (L) >60 mL/min    Comment: (NOTE) Calculated using the CKD-EPI Creatinine Equation (2021)    Anion gap 8 5 - 15    Comment: Performed at [facility] [address] [place], [place] [address]  CBC with Differential/Platelet     Status: Abnormal   Collection Time: 10/24/20  2:24 AM  Result Value Ref Range   WBC 10.2 4.0 - 10.5 K/uL   RBC 3.21 (L) 3.87 - 5.11 MIL/uL   Hemoglobin 9.5 (L) 12.0 - 15.0 g/dL   HCT 31.8 (L) 36.0 - 46.0 %   MCV 99.1 80.0 - 100.0 fL   MCH 29.6 26.0 - 34.0 pg   MCHC 29.9 (L) 30.0 - 36.0 g/dL   RDW 15.9 (H) 11.5 - 15.5 %   Platelets 85 (L) 150 - 400 K/uL    Comment: REPEATED TO VERIFY SPECIMEN CHECKED FOR CLOTS Immature Platelet Fraction may be clinically indicated, consider ordering this additional test [id number] CONSISTENT WITH PREVIOUS RESULT    nRBC 0.3 (H) 0.0 - 0.2 %   Neutrophils Relative % 18 %   Neutro Abs 1.8 1.7 - 7.7 K/uL   Lymphocytes Relative 13 %   Lymphs Abs 1.3 0.7 - 4.0 K/uL   Monocytes Relative 67 %   Monocytes Absolute 6.9 (H) 0.1 - 1.0 K/uL   Eosinophils Relative 0 %   Eosinophils Absolute 0.0 0.0 - 0.5 K/uL   Basophils Relative 0 %   Basophils  Absolute 0.0 0.0 - 0.1 K/uL   Immature Granulocytes 2 %   Abs Immature Granulocytes 0.18 (H) 0.00 - 0.07 K/uL    Comment: Performed at [facility], [place] [address] [place], [place] [address]  Magnesium     Status: Abnormal   Collection Time: 10/24/20  2:24 AM  Result Value Ref Range   Magnesium 1.4 (L) 1.7 - 2.4 mg/dL    Comment: Performed at [facility] [address], [place], [place] [address]  Creatinine, urine, random     Status: None   Collection Time: 10/24/20 10:35 PM  Result Value Ref Range   Creatinine, Urine 94.38 mg/dL    Comment: Performed at [facility] [address], [place], [place] [address]  Sodium, urine, random     Status: None   Collection Time: 10/24/20 10:35 PM  Result Value Ref Range   Sodium, Ur 36 mmol/L    Comment: Performed at [facility] [address] [place], [place] [address]  Osmolality, urine     Status: None   Collection Time: 10/24/20 10:36 PM  Result Value Ref Range   Osmolality, Ur 399 300 - 900 mOsm/kg    Comment: Performed at [facility] [address], [place], [place] [address]  Basic metabolic panel     Status: Abnormal   Collection Time: 10/25/20  3:25 AM  Result Value Ref Range   Sodium 121 (L) 135 - 145 mmol/L   Potassium 3.9 3.5 - 5.1 mmol/L   Chloride 90 (L) 98 - 111 mmol/L   CO2 22 22 - 32 mmol/L   Glucose, Bld 99 70 - 99 mg/dL    Comment: Glucose reference range applies only to samples taken after fasting for at least 8 hours.  BUN 23 8 - 23 mg/dL   Creatinine, Ser 1.35 (H) 0.44 - 1.00 mg/dL   Calcium 8.1 (L) 8.9 - 10.3 mg/dL   GFR, Estimated 39 (L) >60 mL/min    Comment: (NOTE) Calculated using the CKD-EPI Creatinine Equation (2021)    Anion gap 9 5 - 15    Comment: Performed at [facility] [address] [place], [place] [address]  CBC with Differential/Platelet     Status: Abnormal   Collection Time: 10/25/20  3:25 AM  Result Value Ref Range   WBC 9.5 4.0 - 10.5 K/uL     Comment: REPEATED TO VERIFY   RBC 2.73 (L) 3.87 - 5.11 MIL/uL   Hemoglobin 8.1 (L) 12.0 - 15.0 g/dL   HCT 25.9 (L) 36.0 - 46.0 %   MCV 94.9 80.0 - 100.0 fL   MCH 29.7 26.0 - 34.0 pg   MCHC 31.3 30.0 - 36.0 g/dL   RDW 15.8 (H) 11.5 - 15.5 %   Platelets 101 (L) 150 - 400 K/uL    Comment: Immature Platelet Fraction may be clinically indicated, consider ordering this additional test [id number]    nRBC 0.0 0.0 - 0.2 %   Neutrophils Relative % 15 %   Neutro Abs 1.5 (L) 1.7 - 7.7 K/uL   Lymphocytes Relative 17 %   Lymphs Abs 1.6 0.7 - 4.0 K/uL   Monocytes Relative 66 %   Monocytes Absolute 6.3 (H) 0.1 - 1.0 K/uL   Eosinophils Relative 0 %   Eosinophils Absolute 0.0 0.0 - 0.5 K/uL   Basophils Relative 0 %   Basophils Absolute 0.0 0.0 - 0.1 K/uL   Immature Granulocytes 2 %   Abs Immature Granulocytes 0.16 (H) 0.00 - 0.07 K/uL    Comment: Performed at [facility], [place] [address] [place], [place] [address]  Magnesium     Status: None   Collection Time: 10/25/20  3:25 AM  Result Value Ref Range   Magnesium 1.7 1.7 - 2.4 mg/dL    Comment: Performed at [facility], [place] [address] [place], [place] [address]     ROS:  Musculoskeletal:positive for knee pain  Physical Exam: Vitals:   10/25/20 0524 10/25/20 0754  BP: 105/78 (!) 101/52  Pulse: 78 81  Resp: 17 18  Temp: 98 F (36.7 C)   SpO2: 95% 90%     General: elderly BF-  NAD- not sure she fully grasps what is happening here HEENT: PERRLA, EOMI Neck: no JVD Heart: RRR Lungs: mostly clear Abdomen: soft, non tender Extremities: min edema-  Tenderness to touch Skin: warm and dry Neuro: alert- non focal   Assessment/Plan: 86 year old BF with variable renal function and hyponatremia in the setting of hosp and mutiple medication manipulations 1.Renal- recent AKI felt due to ARB/HCT combo and mobic- meds now stopped and AKI resolved.  I think most recent change in renal function was due to iv contrast 2.  Hyponatremia-  Her initial work up does seem c/w SIADH with elevated urine osm.  She probably had a component of hypovolemic hyponatremia or tea and toast upon admit that did improve with IVF-  Most recent change- (actually dramatic- not sure if there wasn't a lab error somewhere either yest or today)  I am going to start a very low dose furosemide to dilute her urine-  I am going to bump up her synthroid in case hypothyroid is involved and follow with you  3. Gout  -  seems to be a legitimate diagnosis that is hampering her mobility-  I have increased her allopurinol and added back just a little colchicine at her granssons request 4. Anemia  - has been seen by heme onc-  Do not feel like is related to her CKD as her CKD is not that bad - is dropping here and cardiology is talking blood thinners for her A fib.    Louis Meckel 10/25/2020, 11:52 AM

## 2020-10-25 NOTE — Progress Notes (Signed)
Notified MD of lack of urine output. Orders received. 950 ml on in and out catheter. NS started at 51ml/hour. Tolerated procedure well.

## 2020-10-25 NOTE — Progress Notes (Signed)
Inpatient Rehabilitation Admissions Coordinator  Noted medical work up continues. We will follow up next week to assist with planning rehab venue options.  Danne Baxter, RN, MSN Rehab Admissions Coordinator [phone] 10/25/2020 12:21 PM

## 2020-10-25 NOTE — Progress Notes (Signed)
ANTICOAGULATION CONSULT NOTE - Follow Up Consult  Pharmacy Consult for Enoxaparin Indication: atrial fibrillation  Allergies  Allergen Reactions  . Meloxicam Other (See Comments)    Caused an injury to the kidneys, per nephrologist  . Micardis Hct [Telmisartan-Hctz] Other (See Comments)    Caused an injury to the kidneys, per nephrologist    Patient Measurements: Height: 5\' 7"  (170.2 cm) Weight: 88.8 kg (195 lb 12.3 oz) IBW/kg (Calculated) : 61.6  Vital Signs: Temp: 98 F (36.7 C) (12/24 0524) Temp Source: Oral (12/24 0524) BP: 101/52 (12/24 0754) Pulse Rate: 81 (12/24 0754)  Labs: Recent Labs    10/23/20 0058 10/24/20 0224 10/25/20 0325  HGB 9.6* 9.5* 8.1*  HCT 30.7* 31.8* 25.9*  PLT 67* 85* 101*  CREATININE 1.01* 0.95 1.35*    Estimated Creatinine Clearance: 35.5 mL/min (A) (by C-G formula based on SCr of 1.35 mg/dL (H)).  Assessment: 86 y.o. female with progressive weakness with unknown etiology, weight loss, hypertension, who we are seeing for atrial fibrillation with rapid ventricular response.  Goal of Therapy:  Monitor platelets by anticoagulation protocol: Yes   Plan:  Start Lovenox 90 mg subq q12hr Monitor renal function and CBC.  Alanda Slim, PharmD, [facility] Clinical Pharmacist Please see AMION for all Pharmacists' Contact Phone Numbers 10/25/2020, 10:20 AM

## 2020-10-25 NOTE — Progress Notes (Signed)
PROGRESS NOTE  Lisa [name]  DOB: 10-15-1934  PCP: Celene Squibb, MD PPI:[id number]  DOA: 10/21/2020  LOS: 4 days   Chief Complaint  Patient presents with  . Weakness   Brief narrative: Patient is an 86 year old with PMH significant for breast cancer, lung cancer, hypothyroidism, gout who presented to the ED with progressive weakness, worse on the left lower extremity. She also has left lower extremity pain especially in her knee and ankle.  Patient has 87-month history of poor appetite and poor oral intake. She has had recent evaluation by hematology for pancytopenia.  Also was seen by nephrology in the office for AKI thought to be secondary to ARB, hydrochlorothiazide and meloxicam, ATN.  Evaluation in the ED patient was found to have AKI with a creatinine of 1.5, hyponatremia sodium down to 122.  New diagnosis of A. fib RVR.  For her weight loss and decreased oral intake GI has been consulted.  Subjective: Patient was seen and examined this morning. Sitting up in bed.  Not in distress.  Not on supplemental oxygen. Leg pain improved.  No active bleeding. Grandson at bedside. We discussed about CT scan report from yesterday, as below. Labs this morning showed dropping sodium level, rising creatinine level and a drop in hemoglobin.  Assessment/Plan: Hyponatremia:  -Likely hypovolemic based on normal serum osmolality and above normal urine osmolality..  However, need to rule out SIADH because of coexisting lung cancer. -Sodium level abruptly down to 121 this morning. -Not getting IV hydration for last 36 hours. Recent Labs  Lab 10/21/20 1123 10/21/20 2000 10/22/20 0214 10/22/20 1859 10/23/20 0058 10/24/20 0224 10/25/20 0325  NA 122* 122* 124* 124* 125* 129* 121*   H. pylori positive -Underwent EGD on 12/21 -No evidence of bleeding but biopsy positive for H. Pylori. -We will start on the triple therapy regimen recommended by GI.   Acute on chronic  anemia -Hemoglobin at baseline more than 10.  Since admission, she has had steady drop in hemoglobin, down to 8.1 today.   -12/22, EGD negative.  Diverticulosis noted in CT scan of abdomen. -Because of coexisting A. fib, we made a decision to start Lovenox full dose and monitor hemoglobin. -Ferritin level was 368 in September.  Repeat ferritin level today. Recent Labs    12/19/19 1051 02/26/20 1049 07/09/20 1050 10/21/20 1123 10/22/20 0214 10/22/20 0820 10/23/20 0058 10/24/20 0224 10/25/20 0325  HGB 11.8* 11.3* 10.9* 10.9*  10.9* 10.7*  --  9.6* 9.5* 8.1*  MCV 104.2* 106.1* 105.3* 93.9  93.2 94.8  --  95.0 99.1 94.9  VITAMINB12 382 323 268  --   --  545  --   --   --   FOLATE 16.8  --  16.7  --   --   --   --   --   --   FERRITIN 188 200 368*  --   --   --   --   --   --   TIBC 352 369 352  --   --   --   --   --   --   IRON 53 53 54  --   --   --   --   --   --    A fib RVR, mild elevation troponin:  -Continue with metoprolol. -CHADSVASC score 4.  For anticoagulation, will start the patient on full dose Lovenox today and monitor hemoglobin.   Failure to thrive, anorexia -20 pound weight loss reported prior to admission.  -  CT scan on 12/23 ruled out recurrence of lung cancer. -Family reports normal CRP in recent outpatient work-up. -Patient's appetite is better now.  Bilateral lower extremity/knee pain -Multifactorial: Osteoarthritis, peripheral neuropathy, gout, deconditioning.   -Elevated uric acid level on admission.  Started on colchicine and allopurinol.  -Gabapentin resumed.  Initiated on hold.  -Continue gabapentin and allopurinol.  Colchicine had to be stopped today because of its potential interaction with clarithromycin.   -We will schedule tramadol twice daily and monitor. -Obtain x-ray of both knees today. -Lower extremity ultrasound negative for DVT.  Hypomagnesemia -Magnesium level still low at 1.7 this morning.  IV replacement ordered. Recent Labs  Lab  10/22/20 0214 10/22/20 1859 10/23/20 0058 10/24/20 0224 10/25/20 0325  K 3.7 3.9 3.8 4.1 3.9  MG 1.3*  --  1.7 1.4* 1.7   AKI superimposed on CKD 3b:  Creatinine at baseline between 1.1-1.3.  Presented with an elevated creatinine of 1.53. Related to recent use of NSAID, ARB, HCTZ and hypovolemia.  Creatinine initially improved with IV fluid but worse again today, at home 1.35.  Nephrology called. Recent Labs    12/19/19 1051 02/26/20 1049 07/09/20 1050 10/21/20 1123 10/21/20 2000 10/22/20 0214 10/22/20 1859 10/23/20 0058 10/24/20 0224 10/25/20 0325  BUN 22 28* 30* 30* 27* 25* 24* 20 9 23   CREATININE 1.23* 1.33* 1.32* 1.53* 1.17* 1.26* 1.09* 1.01* 0.95 1.35*   Thrombocytopenia:  -Improving. Recent Labs  Lab 10/21/20 1123 10/22/20 0214 10/23/20 0058 10/24/20 0224 10/25/20 0325  PLT 92*  89* 77* 67* 85* 101*   Hypothyroidism:  Continue with Synthroid.   Mobility: PT eval appreciated.  CIR recommended. Code Status:   Code Status: Full Code  Nutritional status: Body mass index is 30.66 kg/m. Nutrition Problem: Increased nutrient needs Etiology: acute illness Signs/Symptoms: estimated needs Diet Order            Diet regular Room service appropriate? Yes; Fluid consistency: Thin  Diet effective now                 DVT prophylaxis: Place and maintain sequential compression device Start: 10/22/20 1202   Antimicrobials:  None Fluid: No IV fluid Consultants: None Family Communication:  Grandson at bedside.  Status is: Inpatient  Remains inpatient appropriate because: ongoing workup  Dispo: The patient is from: Home              Anticipated d/c is to: Rehab              Anticipated d/c date is: 2 days              Patient currently is not medically stable to d/c.   Infusions:    Scheduled Meds: . acetaminophen  650 mg Oral Q6H  . allopurinol  100 mg Oral Daily  . colchicine  0.3 mg Oral Daily  . feeding supplement  237 mL Oral BID BM  .  ferrous sulfate  325 mg Oral Q breakfast  . fluticasone furoate-vilanterol  1 puff Inhalation Daily   And  . umeclidinium bromide  1 puff Inhalation Daily  . gabapentin  300 mg Oral TID  . levothyroxine  88 mcg Oral Q0600  . metoprolol succinate  50 mg Oral Daily  . multivitamin with minerals  1 tablet Oral Daily  . nystatin  5 mL Oral TID AC & HS  . sodium chloride flush  3 mL Intravenous Q12H    Antimicrobials: Anti-infectives (From admission, onward)   None  PRN meds: albuterol, ondansetron **OR** ondansetron (ZOFRAN) IV, traMADol   Objective: Vitals:   10/25/20 0524 10/25/20 0754  BP: 105/78 (!) 101/52  Pulse: 78 81  Resp: 17 18  Temp: 98 F (36.7 C)   SpO2: 95% 90%    Intake/Output Summary (Last 24 hours) at 10/25/2020 1007 Last data filed at 10/24/2020 1800 Gross per 24 hour  Intake 800 ml  Output 450 ml  Net 350 ml   Filed Weights   10/22/20 1540 10/23/20 0705 10/25/20 0524  Weight: 89.4 kg 89.4 kg 88.8 kg   Weight change: -0.6 kg Body mass index is 30.66 kg/m.   Physical Exam: General exam: Pleasant, elderly African-American female. Not in distress Skin: No rashes, lesions or ulcers. HEENT: Atraumatic, normocephalic, no obvious bleeding Lungs: Clear to auscultation bilaterally CVS: Regular rate and rhythm, no murmur GI/Abd soft, nontender, nondistended, bowel sound present CNS: Alert, awake, oriented to place and person Psychiatry: Mood appropriate Extremities: No pedal edema, no calf tenderness.  Knee pain more on active movement.  Data Review: I have personally reviewed the laboratory data and studies available.  Recent Labs  Lab 10/21/20 1123 10/22/20 0214 10/23/20 0058 10/24/20 0224 10/25/20 0325  WBC 9.7  9.5 6.3 8.2 10.2 9.5  NEUTROABS 1.9 1.4* 2.0 1.8 1.5*  HGB 10.9*  10.9* 10.7* 9.6* 9.5* 8.1*  HCT 35.5*  35.6* 34.8* 30.7* 31.8* 25.9*  MCV 93.9  93.2 94.8 95.0 99.1 94.9  PLT 92*  89* 77* 67* 85* 101*   Recent Labs   Lab 10/22/20 0214 10/22/20 1859 10/23/20 0058 10/24/20 0224 10/25/20 0325  NA 124* 124* 125* 129* 121*  K 3.7 3.9 3.8 4.1 3.9  CL 91* 91* 93* 95* 90*  CO2 21* 24 22 26 22   GLUCOSE 125* 126* 113* 97 99  BUN 25* 24* 20 9 23   CREATININE 1.26* 1.09* 1.01* 0.95 1.35*  CALCIUM 9.2 8.7* 8.4* 8.6* 8.1*  MG 1.3*  --  1.7 1.4* 1.7   F/u labs ordered.  Signed, Terrilee Croak, MD [facility] 10/25/2020

## 2020-10-26 ENCOUNTER — Inpatient Hospital Stay (HOSPITAL_COMMUNITY): Payer: Medicare Other

## 2020-10-26 DIAGNOSIS — M25461 Effusion, right knee: Secondary | ICD-10-CM

## 2020-10-26 DIAGNOSIS — N1832 Chronic kidney disease, stage 3b: Secondary | ICD-10-CM | POA: Diagnosis not present

## 2020-10-26 DIAGNOSIS — M25462 Effusion, left knee: Secondary | ICD-10-CM

## 2020-10-26 DIAGNOSIS — N179 Acute kidney failure, unspecified: Secondary | ICD-10-CM | POA: Diagnosis not present

## 2020-10-26 LAB — SYNOVIAL CELL COUNT + DIFF, W/ CRYSTALS
Eosinophils-Synovial: 0 % (ref 0–1)
Lymphocytes-Synovial Fld: 9 % (ref 0–20)
Monocyte-Macrophage-Synovial Fluid: 54 % (ref 50–90)
Neutrophil, Synovial: 37 % — ABNORMAL HIGH (ref 0–25)
WBC, Synovial: 2340 /mm3 — ABNORMAL HIGH (ref 0–200)

## 2020-10-26 LAB — CBC WITH DIFFERENTIAL/PLATELET
Abs Immature Granulocytes: 0.17 10*3/uL — ABNORMAL HIGH (ref 0.00–0.07)
Basophils Absolute: 0 10*3/uL (ref 0.0–0.1)
Basophils Relative: 0 %
Eosinophils Absolute: 0 10*3/uL (ref 0.0–0.5)
Eosinophils Relative: 0 %
HCT: 25.1 % — ABNORMAL LOW (ref 36.0–46.0)
Hemoglobin: 7.9 g/dL — ABNORMAL LOW (ref 12.0–15.0)
Immature Granulocytes: 3 %
Lymphocytes Relative: 27 %
Lymphs Abs: 1.8 10*3/uL (ref 0.7–4.0)
MCH: 29.7 pg (ref 26.0–34.0)
MCHC: 31.5 g/dL (ref 30.0–36.0)
MCV: 94.4 fL (ref 80.0–100.0)
Monocytes Absolute: 2.9 10*3/uL — ABNORMAL HIGH (ref 0.1–1.0)
Monocytes Relative: 45 %
Neutro Abs: 1.6 10*3/uL — ABNORMAL LOW (ref 1.7–7.7)
Neutrophils Relative %: 25 %
Platelets: 119 10*3/uL — ABNORMAL LOW (ref 150–400)
RBC: 2.66 MIL/uL — ABNORMAL LOW (ref 3.87–5.11)
RDW: 15.7 % — ABNORMAL HIGH (ref 11.5–15.5)
WBC: 6.5 10*3/uL (ref 4.0–10.5)
nRBC: 0 % (ref 0.0–0.2)

## 2020-10-26 LAB — BASIC METABOLIC PANEL
Anion gap: 7 (ref 5–15)
Anion gap: 7 (ref 5–15)
BUN: 33 mg/dL — ABNORMAL HIGH (ref 8–23)
BUN: 35 mg/dL — ABNORMAL HIGH (ref 8–23)
CO2: 22 mmol/L (ref 22–32)
CO2: 22 mmol/L (ref 22–32)
Calcium: 7.7 mg/dL — ABNORMAL LOW (ref 8.9–10.3)
Calcium: 7.6 mg/dL — ABNORMAL LOW (ref 8.9–10.3)
Chloride: 89 mmol/L — ABNORMAL LOW (ref 98–111)
Chloride: 90 mmol/L — ABNORMAL LOW (ref 98–111)
Creatinine, Ser: 1.74 mg/dL — ABNORMAL HIGH (ref 0.44–1.00)
Creatinine, Ser: 1.7 mg/dL — ABNORMAL HIGH (ref 0.44–1.00)
GFR, Estimated: 29 mL/min — ABNORMAL LOW (ref >60)
GFR, Estimated: 29 mL/min — ABNORMAL LOW (ref >60)
Glucose, Bld: 119 mg/dL — ABNORMAL HIGH (ref 70–99)
Glucose, Bld: 132 mg/dL — ABNORMAL HIGH (ref 70–99)
Potassium: 4.2 mmol/L (ref 3.5–5.1)
Potassium: 4.6 mmol/L (ref 3.5–5.1)
Sodium: 118 mmol/L — CL (ref 135–145)
Sodium: 119 mmol/L — CL (ref 135–145)

## 2020-10-26 LAB — GRAM STAIN

## 2020-10-26 LAB — MAGNESIUM: Magnesium: 1.9 mg/dL (ref 1.7–2.4)

## 2020-10-26 MED ORDER — SENNOSIDES-DOCUSATE SODIUM 8.6-50 MG PO TABS
1.0000 | ORAL_TABLET | Freq: Every day | ORAL | Status: DC
  Administered 2020-10-26 – 2020-10-29 (×3): 1 via ORAL
  Filled 2020-10-26 (×3): qty 1

## 2020-10-26 MED ORDER — POLYETHYLENE GLYCOL 3350 17 G PO PACK: 17.0000 g | PACK | Freq: Every day | ORAL | Status: DC | PRN

## 2020-10-26 MED ORDER — METOPROLOL TARTRATE 12.5 MG HALF TABLET
12.5000 mg | ORAL_TABLET | Freq: Two times a day (BID) | ORAL | Status: DC
  Administered 2020-10-26 – 2020-10-30 (×8): 12.5 mg via ORAL
  Filled 2020-10-26 (×9): qty 1

## 2020-10-26 NOTE — TOC Progression Note (Signed)
Transition of Care [facility]) - Progression Note    Patient Details  Name: Lisa [name] MRN: [id number] Date of Birth: 1934-07-27  Transition of Care [facility]) CM/SW [place], [place] Phone Number: 10/26/2020, 12:26 PM  Clinical Narrative:     CSW received consult from nurse that pt's Grandson wanted to discuss resources. CSW met with Grandson at bedside and discussed insurance, outpatient options, and next levels of care. CSW will continue to follow for DC placement, CIR currently following.       Expected Discharge Plan and Services                                                 Social Determinants of Health (SDOH) Interventions    Readmission Risk Interventions No flowsheet data found.

## 2020-10-26 NOTE — Progress Notes (Signed)
Patient did not void this shift, and also had a critical lab of low sodium, on call MD was notified, bladder scan and in and out cath was done with 300cc urine return. New orders noted to increase IVF NS from 50 to 75 mL/hr

## 2020-10-26 NOTE — Plan of Care (Signed)

## 2020-10-26 NOTE — Progress Notes (Signed)
Patient ID: Lisa [name], female   DOB: 1934-04-08, 86 y.o.   MRN: [id number] The patient's aspiration findings from her knees are consistent with gout.

## 2020-10-26 NOTE — Progress Notes (Signed)
MD notified of critical sodium

## 2020-10-26 NOTE — Progress Notes (Signed)
Cardiology Progress Note  Patient ID: Lisa [name] MRN: [id number] DOB: [birth year] Date of Encounter: 10/26/2020  Primary Cardiologist: No primary care provider on file.  Subjective   Chief Complaint: None.  HPI: Sodium continues to drop down to 118.  Creatinine has risen.  She was given Lovenox last night.  No further A. fib on telemetry.  Hemoglobin trending down as well.  ROS:  All other ROS reviewed and negative. Pertinent positives noted in the HPI.     Inpatient Medications  Scheduled Meds: . acetaminophen  650 mg Oral Q6H  . allopurinol  200 mg Oral Daily  . amoxicillin  1,000 mg Oral Q12H  . clarithromycin  500 mg Oral Q12H  . feeding supplement  237 mL Oral BID BM  . fluticasone furoate-vilanterol  1 puff Inhalation Daily   And  . umeclidinium bromide  1 puff Inhalation Daily  . furosemide  10 mg Oral Daily  . levothyroxine  100 mcg Oral Q0600  . metoprolol tartrate  12.5 mg Oral BID  . metroNIDAZOLE  500 mg Oral Q12H  . multivitamin with minerals  1 tablet Oral Daily  . nystatin  5 mL Oral TID AC & HS  . pantoprazole  40 mg Oral BID  . sodium chloride flush  3 mL Intravenous Q12H   Continuous Infusions:  PRN Meds: albuterol, ondansetron **OR** ondansetron (ZOFRAN) IV   Vital Signs   Vitals:   10/25/20 0754 10/25/20 2125 10/26/20 0507 10/26/20 0821  BP: (!) 101/52 (!) 103/43 (!) 92/54 (!) 104/47  Pulse: 81 92 62 96  Resp: 18 20 18    Temp:  98 F (36.7 C) 98 F (36.7 C)   TempSrc:  Oral Oral   SpO2: 90% 100% 90%   Weight:      Height:        Intake/Output Summary (Last 24 hours) at 10/26/2020 1052 Last data filed at 10/26/2020 0827 Gross per 24 hour  Intake 1096.06 ml  Output 1550 ml  Net -453.94 ml   Last 3 Weights 10/25/2020 10/23/2020 10/22/2020  Weight (lbs) 195 lb 12.3 oz 197 lb 1.5 oz 197 lb 1.5 oz  Weight (kg) 88.8 kg 89.4 kg 89.4 kg      Telemetry  Overnight telemetry shows normal sinus rhythm heart rate in the 90s, which I  personally reviewed.   Physical Exam   Vitals:   10/25/20 0754 10/25/20 2125 10/26/20 0507 10/26/20 0821  BP: (!) 101/52 (!) 103/43 (!) 92/54 (!) 104/47  Pulse: 81 92 62 96  Resp: 18 20 18    Temp:  98 F (36.7 C) 98 F (36.7 C)   TempSrc:  Oral Oral   SpO2: 90% 100% 90%   Weight:      Height:         Intake/Output Summary (Last 24 hours) at 10/26/2020 1052 Last data filed at 10/26/2020 0827 Gross per 24 hour  Intake 1096.06 ml  Output 1550 ml  Net -453.94 ml    Last 3 Weights 10/25/2020 10/23/2020 10/22/2020  Weight (lbs) 195 lb 12.3 oz 197 lb 1.5 oz 197 lb 1.5 oz  Weight (kg) 88.8 kg 89.4 kg 89.4 kg    Body mass index is 30.66 kg/m.  General: Well nourished, well developed, in no acute distress Head: Atraumatic, normal size  Eyes: PEERLA, EOMI  Neck: Supple, no JVD Endocrine: No thryomegaly Cardiac: Normal S1, S2; RRR; 3/6 systolic ejection murmur Lungs: Diffuse rhonchi and wheezing, atelectasis noted Abd: Soft, nontender, no hepatomegaly  Ext: No  edema, pulses 2+ Musculoskeletal: No deformities, BUE and BLE strength normal and equal Skin: Warm and dry, no rashes   Neuro: Alert and oriented to person, place, time, and situation, CNII-XII grossly intact, no focal deficits  Psych: Normal mood and affect   Labs  High Sensitivity Troponin:   Recent Labs  Lab 10/21/20 1830 10/21/20 2000 10/22/20 0214 10/22/20 0820  TROPONINIHS 41* 35* 33* 29*     Cardiac EnzymesNo results for input(s): TROPONINI in the last 168 hours. No results for input(s): TROPIPOC in the last 168 hours.  Chemistry Recent Labs  Lab 10/21/20 1123 10/21/20 2000 10/24/20 0224 10/25/20 0325 10/26/20 0240  NA 122*   < > 129* 121* 118*  K 3.8   < > 4.1 3.9 4.2  CL 88*   < > 95* 90* 89*  CO2 21*   < > 26 22 22   GLUCOSE 121*   < > 97 99 119*  BUN 30*   < > 9 23 33*  CREATININE 1.53*   < > 0.95 1.35* 1.74*  CALCIUM 9.4   < > 8.6* 8.1* 7.7*  PROT 8.1  --   --   --   --   ALBUMIN 3.3*  --    --   --   --   AST 20  --   --   --   --   ALT 13  --   --   --   --   ALKPHOS 36*  --   --   --   --   BILITOT 1.5*  --   --   --   --   GFRNONAA 33*   < > 59* 39* 29*  ANIONGAP 13   < > 8 9 7    < > = values in this interval not displayed.    Hematology Recent Labs  Lab 10/24/20 0224 10/25/20 0325 10/26/20 0240  WBC 10.2 9.5 6.5  RBC 3.21* 2.73* 2.66*  HGB 9.5* 8.1* 7.9*  HCT 31.8* 25.9* 25.1*  MCV 99.1 94.9 94.4  MCH 29.6 29.7 29.7  MCHC 29.9* 31.3 31.5  RDW 15.9* 15.8* 15.7*  PLT 85* 101* 119*   BNPNo results for input(s): BNP, PROBNP in the last 168 hours.  DDimer No results for input(s): DDIMER in the last 168 hours.   Radiology  DG Knee 1-2 Views Left  Result Date: 10/25/2020 CLINICAL DATA:  Knee pain, no known injury, initial encounter EXAM: LEFT KNEE - 2 VIEW COMPARISON:  None. FINDINGS: Tricompartmental degenerative changes are noted. Moderate joint effusion is noted similar to that seen on the right. No acute fracture or dislocation is noted. IMPRESSION: Degenerative change with joint effusion similar to that seen on the right side. Electronically Signed   By: Inez Catalina M.D.   On: 10/25/2020 13:01   DG Knee 1-2 Views Right  Result Date: 10/25/2020 CLINICAL DATA:  Bilateral knee pain EXAM: RIGHT KNEE - 2 VIEW COMPARISON:  None. FINDINGS: Tricompartmental degenerative changes are noted with lateral joint space narrowing. Moderate joint effusion is seen. No other soft tissue abnormality is seen. IMPRESSION: Degenerative change with joint effusion. Electronically Signed   By: Inez Catalina M.D.   On: 10/25/2020 13:00   CT CHEST ABDOMEN PELVIS W CONTRAST  Result Date: 10/24/2020 CLINICAL DATA:  Poor appetite, personal history of breast cancer and lung cancer EXAM: CT CHEST, ABDOMEN, AND PELVIS WITH CONTRAST TECHNIQUE: Multidetector CT imaging of the chest, abdomen and pelvis was performed following the standard protocol  during bolus administration of intravenous  contrast. CONTRAST:  177mL OMNIPAQUE IOHEXOL 300 MG/ML  SOLN COMPARISON:  06/13/2019 FINDINGS: CT CHEST FINDINGS Cardiovascular: The heart is mildly enlarged without pericardial effusion. Extensive atherosclerosis throughout the coronary vasculature. The aorta is normal in caliber. There is extensive atherosclerosis of the aortic arch, with moderate stenosis of the origin of the left subclavian artery. Mediastinum/Nodes: Stable subcentimeter left hilar lymph nodes. No pathologic adenopathy. Thyroid, trachea, and esophagus are unremarkable. Lungs/Pleura: Stable postsurgical changes from partial left upper lobe resection. Upper lobe predominant emphysema. Bibasilar scarring. No airspace disease, effusion, or pneumothorax. Musculoskeletal: No acute or destructive bony lesions. Reconstructed images demonstrate no additional findings. CT ABDOMEN PELVIS FINDINGS Hepatobiliary: No focal liver abnormality is seen. No gallstones, gallbladder wall thickening, or biliary dilatation. Pancreas: Unremarkable. No pancreatic ductal dilatation or surrounding inflammatory changes. Spleen: Normal in size without focal abnormality. Adrenals/Urinary Tract: Adrenal glands are unremarkable. Kidneys are normal, without renal calculi, focal lesion, or hydronephrosis. Bladder is unremarkable. Stomach/Bowel: No bowel obstruction or ileus. Diverticulosis of the sigmoid colon without diverticulitis. Normal appendix right lower quadrant. No bowel wall thickening or inflammatory change. Vascular/Lymphatic: Infrarenal abdominal aortic aneurysm measures up to 4.8 cm in diameter, previously having measured 4.1 cm on 05/31/2017 exam. Significant atherosclerosis and mural thrombus throughout the aorta. No pathologic adenopathy. Reproductive: Uterus and bilateral adnexa are unremarkable. Other: No free fluid or free gas. No abdominal wall hernia. Musculoskeletal: No acute or destructive bony lesions. Reconstructed images demonstrate no additional  findings. IMPRESSION: 1. Stable postsurgical changes from partial left upper lobe resection. 2. Infrarenal abdominal aortic aneurysm measures up to 4.8 cm in diameter, previously having measured 4.1 cm on 05/31/2017 exam. Recommend follow-up CT/MR every 6 months and vascular consultation. This recommendation follows ACR consensus guidelines: White Paper of the ACR Incidental Findings Committee II on Vascular Findings. J Am Coll Radiol 2013; 10:789-794. 3. Diverticulosis without diverticulitis. 4. Aortic Atherosclerosis (ICD10-I70.0) and Emphysema (ICD10-J43.9). Electronically Signed   By: Randa Ngo M.D.   On: 10/24/2020 18:00    Cardiac Studies  TTE 10/22/2020 1. Left ventricular ejection fraction, by estimation, is 65 to 70%. The  left ventricle has normal function. The left ventricle has no regional  wall motion abnormalities. There is mild left ventricular hypertrophy.  Left ventricular diastolic parameters  are consistent with Grade I diastolic dysfunction (impaired relaxation).  2. Prominent moderator band. Right ventricular systolic function is  hyperdynamic. The right ventricular size is normal. There is moderately  elevated pulmonary artery systolic pressure.  3. Left atrial size was moderately dilated.  4. The mitral valve is grossly normal. Trivial mitral valve  regurgitation.  5. The aortic valve is tricuspid. Aortic valve regurgitation is not  visualized. Mild aortic valve sclerosis is present, with no evidence of  aortic valve stenosis.  6. The inferior vena cava is normal in size with greater than 50%  respiratory variability, suggesting right atrial pressure of 3 mmHg.   Patient Profile  Lisa [name] is a 86 y.o. female with hypertension, COPD, CKD who was admitted on 1221 with weakness and fatigue and found to have hyponatremia.  Cardiology was consulted for paroxysmal atrial fibrillation.  She is maintaining sinus rhythm.  Assessment & Plan   1.  Paroxysmal  atrial fibrillation -Suspect this is secondary to acute illness.  EF normal.  Left atrium moderately dilated. -Her hemoglobin continues to trend down.  There is weight loss and need for ongoing procedures.  I recommend to hold anticoagulation until her acute illness  is handled.  She is also considerably hyponatremic and may need further procedures. -I would recommend SCDs for now.  I will defer to primary team for DVT prophylaxis if they think this is reasonable they can pursue this. -I would continue metoprolol tartrate 12.5 twice daily. -Further work-up of electrolyte derangement and anemia per primary team. -I did discuss this with her grandson at the bedside this morning.  He is in agreement with this plan.  2.  Wheezing/shortness of breath -Chest x-ray ordered this morning.  [facility] will sign off.   Medication Recommendations: Continue metoprolol as above.  She is maintaining sinus rhythm.  No anticoagulation until medical condition improves including anemia.  She can have a discussion as an outpatient about anticoagulation. Other recommendations (labs, testing, etc): None Follow up as an outpatient: Please notify us when she is closer to discharge and we will arrange hospital follow-up.  For questions or updates, please contact Cloud Please consult [email or web address] for contact info under   Time Spent with Patient: I have spent a total of 25 minutes with patient reviewing hospital notes, telemetry, EKGs, labs and examining the patient as well as establishing an assessment and plan that was discussed with the patient.  > 50% of time was spent in direct patient care.    Signed, Addison Naegeli. Audie Box, [place]  10/26/2020 10:52 AM

## 2020-10-26 NOTE — Progress Notes (Signed)
PROGRESS NOTE  Lisa [name]  DOB: [birth year]  PCP: Celene Squibb, MD JSE:[id number]  DOA: 10/21/2020  LOS: 5 days   Chief Complaint  Patient presents with  . Weakness   Brief narrative: Patient is an 86 year old with PMH significant for breast cancer, lung cancer, hypothyroidism, gout who presented to the ED with progressive weakness, worse on the left lower extremity. She also has left lower extremity pain especially in her knee and ankle.  Patient has 44-monthhistory of poor appetite and poor oral intake. She has had recent evaluation by hematology for pancytopenia.  Also was seen by nephrology in the office for AKI thought to be secondary to ARB, hydrochlorothiazide and meloxicam, ATN.  Evaluation in the ED patient was found to have AKI with a creatinine of 1.5, hyponatremia sodium down to 122.  New diagnosis of A. fib RVR.  For her weight loss and decreased oral intake GI has been consulted.  Subjective: Patient was seen and examined this morning. Some fatigue this morning.   She underwent arthrocentesis of both knees by Dr. BNinfa Lindenthis morning. Nephrologist Dr. GMoshe Ciproand I discussed with patient and her grandson at bedside about increasing creatinine, low sodium and dropping hemoglobin. Last night she was reported to have low urine output and I started her on normal saline at 50 mill per hour.  She had a urinary retention of 9530mfor which she required in and out catheterization.  Early this morning, her creatinine was up to 1.75 after which the night MD increased fluid rate to 75 mill per hour. Per nephrologist's recommendation this morning, IV fluid was stopped.  Lasix 10 mg daily was continued. In order to help renal function recovery, I stopped Lovenox, tramadol, gabapentin and colchicine.  Assessment/Plan: AKI superimposed on CKD 3b:  Creatinine at baseline between 1.1-1.3.  Presented with an elevated creatinine of 1.53. Related to recent use of NSAID, ARB,  HCTZ and hypovolemia.  Creatinine initially improved with IV fluid but worse again.  It is 1.74 today.  Nephrology following. In order to help renal function recovery, I stopped Lovenox, tramadol, gabapentin and colchicine. Recent Labs    02/26/20 1049 07/09/20 1050 10/21/20 1123 10/21/20 2000 10/22/20 0214 10/22/20 1859 10/23/20 0058 10/24/20 0224 10/25/20 0325 10/26/20 0240  BUN 28* 30* 30* 27* 25* 24* '20 9 23 ' 33*  CREATININE 1.33* 1.32* 1.53* 1.17* 1.26* 1.09* 1.01* 0.95 1.35* 1.74*   Hyponatremia:  -Likely hypovolemic based on normal serum osmolality and above normal urine osmolality..  However, need to rule out SIADH because of history of lung cancer.  Fortunately, repeat CT scan does not show any recurrence of lung cancer. -Sodium level, continues to trend low.  Nephrology involved.  Currently getting low-dose Lasix 10 mg daily oral. -Noted a plan from nephrology to repeat BMP this afternoon. Recent Labs  Lab 10/21/20 1123 10/21/20 2000 10/22/20 0214 10/22/20 1859 10/23/20 0058 10/24/20 0224 10/25/20 0325 10/26/20 0240  NA 122* 122* 124* 124* 125* 129* 121* 118*   H. pylori positive -Underwent EGD on 12/21 -No evidence of bleeding but biopsy positive for H. Pylori. -12/24, I started triple therapy regimen recommended by GI.   Acute on chronic anemia -Hemoglobin at baseline more than 10.  Since admission, she has had steady drop in hemoglobin, down to 7.9 today.  Full dose Lovenox stopped this morning.  No active bleeding.  FOBT ordered. -12/22, EGD negative.  Diverticulosis noted in CT scan of abdomen. -GI notified of the change. Recent Labs  12/19/19 1051 02/26/20 1049 07/09/20 1050 10/21/20 1123 10/22/20 0214 10/22/20 0820 10/23/20 0058 10/24/20 0224 10/25/20 0325 10/25/20 1059 10/26/20 0240  HGB 11.8* 11.3* 10.9*   < > 10.7*  --  9.6* 9.5* 8.1*  --  7.9*  MCV 104.2* 106.1* 105.3*   < > 94.8  --  95.0 99.1 94.9  --  94.4  VITAMINB12 382 323 268   --   --  545  --   --   --   --   --   FOLATE 16.8  --  16.7  --   --   --   --   --   --   --   --   FERRITIN 188 200 368*  --   --   --   --   --   --  939*  --   TIBC 352 369 352  --   --   --   --   --   --   --   --   IRON 53 53 54  --   --   --   --   --   --   --   --    < > = values in this interval not displayed.   Bilateral lower extremity/knee pain -Multifactorial: Osteoarthritis, peripheral neuropathy, gout, deconditioning.  Lower extremities DVT scan negative -Elevated uric acid level on admission.  Started on colchicine and allopurinol.  -Orthopedic Dr. Ninfa Linden did arthrocentesis of both knees this morning.  Fluid sent for cell count, Gram stain, culture, crystal analysis.  If continues to have pain, may need intra-articular steroid injection. -Pain control with Tylenol.  I had to hold tramadol, gabapentin, colchicine this morning because of AKI.  A fib RVR, mild elevation troponin:  -Continue with metoprolol. -CHADSVASC score 4.  Anticoagulation recommended by cardiology.  12/24, I started the patient on full dose of Lovenox.  However because of worsening creatinine function, I stopped her this morning.  Patient got only 1 dose of full dose Lovenox  Elevated inflammatory markers -Present with failure to thrive, anorexia, 20 pound weight loss.   -12/23, CT scan of chest abdomen and pelvis was negative for any recurrence of malignancy. -Patient has elevated ferritins, CRP, ESR level.  Unclear cause.  Both knees aspirated today.  Fluid analysis may set some light.  Hypomagnesemia -Magnesium level still low at 1.9 this morning.  IV replacement ordered. Recent Labs  Lab 10/22/20 0214 10/22/20 1859 10/23/20 0058 10/24/20 0224 10/25/20 0325 10/26/20 0240  K 3.7 3.9 3.8 4.1 3.9 4.2  MG 1.3*  --  1.7 1.4* 1.7 1.9   Thrombocytopenia:  -Improving. Recent Labs  Lab 10/21/20 1123 10/22/20 0214 10/23/20 0058 10/24/20 0224 10/25/20 0325 10/26/20 0240  PLT 92*  89* 77*  67* 85* 101* 119*   Hypothyroidism:  Continue with Synthroid.   Resting tremors of hands -outpatient neurology evaluation.  Referral will be given at discharge  Mobility: PT eval appreciated.  CIR recommended. Code Status:   Code Status: Full Code  Nutritional status: Body mass index is 30.66 kg/m. Nutrition Problem: Increased nutrient needs Etiology: acute illness Signs/Symptoms: estimated needs Diet Order            Diet regular Room service appropriate? Yes; Fluid consistency: Thin  Diet effective now                 DVT prophylaxis: Place and maintain sequential compression device Start: 10/22/20 1202   Antimicrobials:  None Fluid:  No IV fluid Consultants: None Family Communication:  Grandson at bedside.  Status is: Inpatient  Remains inpatient appropriate because: ongoing workup  Dispo: The patient is from: Home              Anticipated d/c is to: Rehab              Anticipated d/c date is: >3 days              Patient currently is not medically stable to d/c.   Infusions:    Scheduled Meds: . acetaminophen  650 mg Oral Q6H  . allopurinol  200 mg Oral Daily  . amoxicillin  1,000 mg Oral Q12H  . clarithromycin  500 mg Oral Q12H  . feeding supplement  237 mL Oral BID BM  . fluticasone furoate-vilanterol  1 puff Inhalation Daily   And  . umeclidinium bromide  1 puff Inhalation Daily  . furosemide  10 mg Oral Daily  . levothyroxine  100 mcg Oral Q0600  . metoprolol tartrate  12.5 mg Oral BID  . metroNIDAZOLE  500 mg Oral Q12H  . multivitamin with minerals  1 tablet Oral Daily  . nystatin  5 mL Oral TID AC & HS  . pantoprazole  40 mg Oral BID  . sodium chloride flush  3 mL Intravenous Q12H    Antimicrobials: Anti-infectives (From admission, onward)   Start     Dose/Rate Route Frequency Ordered Stop   10/25/20 1100  clarithromycin (BIAXIN) tablet 500 mg        500 mg Oral Every 12 hours 10/25/20 1011     10/25/20 1100  amoxicillin (AMOXIL) capsule  1,000 mg        1,000 mg Oral Every 12 hours 10/25/20 1011     10/25/20 1100  metroNIDAZOLE (FLAGYL) tablet 500 mg        500 mg Oral Every 12 hours 10/25/20 1011        PRN meds: albuterol, ondansetron **OR** ondansetron (ZOFRAN) IV   Objective: Vitals:   10/26/20 0507 10/26/20 0821  BP: (!) 92/54 (!) 104/47  Pulse: 62 96  Resp: 18   Temp: 98 F (36.7 C)   SpO2: 90%     Intake/Output Summary (Last 24 hours) at 10/26/2020 1035 Last data filed at 10/26/2020 0827 Gross per 24 hour  Intake 1096.06 ml  Output 1550 ml  Net -453.94 ml   Filed Weights   10/22/20 1540 10/23/20 0705 10/25/20 0524  Weight: 89.4 kg 89.4 kg 88.8 kg   Weight change:  Body mass index is 30.66 kg/m.   Physical Exam: General exam: Pleasant, elderly African-American female. Not in distress Skin: No rashes, lesions or ulcers. HEENT: Atraumatic, normocephalic, no obvious bleeding Lungs: Clear to auscultation bilaterally CVS: Regular rate and rhythm, no murmur GI/Abd soft, nontender, nondistended, bowel sound present CNS: Alert, awake, oriented to place and person Psychiatry: Mood appropriate Extremities: No pedal edema, no calf tenderness.  Both knees feel better after arthrocentesis.  Data Review: I have personally reviewed the laboratory data and studies available.  Recent Labs  Lab 10/22/20 0214 10/23/20 0058 10/24/20 0224 10/25/20 0325 10/26/20 0240  WBC 6.3 8.2 10.2 9.5 6.5  NEUTROABS 1.4* 2.0 1.8 1.5* 1.6*  HGB 10.7* 9.6* 9.5* 8.1* 7.9*  HCT 34.8* 30.7* 31.8* 25.9* 25.1*  MCV 94.8 95.0 99.1 94.9 94.4  PLT 77* 67* 85* 101* 119*   Recent Labs  Lab 10/22/20 0214 10/22/20 1859 10/23/20 0058 10/24/20 0224 10/25/20 0325 10/26/20 0240  NA 124* 124* 125* 129* 121* 118*  K 3.7 3.9 3.8 4.1 3.9 4.2  CL 91* 91* 93* 95* 90* 89*  CO2 21* '24 22 26 22 22  ' GLUCOSE 125* 126* 113* 97 99 119*  BUN 25* 24* '20 9 23 ' 33*  CREATININE 1.26* 1.09* 1.01* 0.95 1.35* 1.74*  CALCIUM 9.2 8.7* 8.4*  8.6* 8.1* 7.7*  MG 1.3*  --  1.7 1.4* 1.7 1.9   F/u labs ordered.  Signed, Terrilee Croak, MD [facility] 10/26/2020

## 2020-10-26 NOTE — Progress Notes (Signed)
Subjective:  Events overnight include dec UOP-  Turned out to be urinary retention s/p I and o with at least 600-  She is maybe more fatigued but not confused per the grandson.  Knee has been tapped-  Many meds stopped- colchicine/tramadol/neurontin/lovenox.  Got transient NS for dec UOP-  Labs this AM kidney wise- crt up to 1.74 and sodium down to 118   Objective Vital signs in last 24 hours: Vitals:   10/25/20 0754 10/25/20 2125 10/26/20 0507 10/26/20 0821  BP: (!) 101/52 (!) 103/43 (!) 92/54 (!) 104/47  Pulse: 81 92 62 96  Resp: 18 20 18    Temp:  98 F (36.7 C) 98 F (36.7 C)   TempSrc:  Oral Oral   SpO2: 90% 100% 90%   Weight:      Height:       Weight change:   Intake/Output Summary (Last 24 hours) at 10/26/2020 7829 Last data filed at 10/26/2020 0827 Gross per 24 hour  Intake 1096.06 ml  Output 1550 ml  Net -453.94 ml    Assessment/Plan: 86 year old BF with variable renal function and hyponatremia in the setting of hosp and mutiple medication manipulations 1.Renal- recent OP AKI felt due to ARB/HCT combo and mobic- meds stopped and AKI resolved.  Now has had recurrence of AKI- in the setting of contrasted CT on 12/23 and now urinary retention/BOO.  No indications for HD at this time.  Will cont to check bladder scans and catheterize as needed 2. Hyponatremia-  Her initial work up does seem c/w SIADH with elevated urine osm.  She probably had a component of hypovolemic hyponatremia or tea and toast upon admit that did improve with IVF-  Most recent change probably more due to SIADH and AKI.    I started a very low dose furosemide 10 mg to dilute her urine- got dose yest and this AM-  I bumped up her synthroid in case hypothyroid is involved.  Now with worsening sodium overnight-  Fortunately does not appear to be having neurological issues with the sodium.  I am going to check again sodium this afternoon-  Will consider something like samsca if sodium worse OR develops neurologic  sequele 3. Gout  - seems to be a legitimate diagnosis that is hampering her mobility-  I  increased her allopurinol and added back just a little colchicine at her grandssons request- now colchicine stopped-  S/p knee tap this AM per ortho  4. Anemia  - has been seen by heme onc-  Do not feel like is related to her CKD as her CKD is not that bad - is dropping here and cardiology is talking blood thinners for her A fib. Blood thinners on hold-  Looking for GIB evidence   Louis Meckel    Labs: Basic Metabolic Panel: Recent Labs  Lab 10/24/20 0224 10/25/20 0325 10/26/20 0240  NA 129* 121* 118*  K 4.1 3.9 4.2  CL 95* 90* 89*  CO2 26 22 22   GLUCOSE 97 99 119*  BUN 9 23 33*  CREATININE 0.95 1.35* 1.74*  CALCIUM 8.6* 8.1* 7.7*   Liver Function Tests: Recent Labs  Lab 10/21/20 1123  AST 20  ALT 13  ALKPHOS 36*  BILITOT 1.5*  PROT 8.1  ALBUMIN 3.3*   No results for input(s): LIPASE, AMYLASE in the last 168 hours. No results for input(s): AMMONIA in the last 168 hours. CBC: Recent Labs  Lab 10/22/20 0214 10/23/20 0058 10/24/20 0224 10/25/20 0325  10/26/20 0240  WBC 6.3 8.2 10.2 9.5 6.5  NEUTROABS 1.4* 2.0 1.8 1.5* 1.6*  HGB 10.7* 9.6* 9.5* 8.1* 7.9*  HCT 34.8* 30.7* 31.8* 25.9* 25.1*  MCV 94.8 95.0 99.1 94.9 94.4  PLT 77* 67* 85* 101* 119*   Cardiac Enzymes: No results for input(s): CKTOTAL, CKMB, CKMBINDEX, TROPONINI in the last 168 hours. CBG: Recent Labs  Lab 10/21/20 1119 10/21/20 1304  GLUCAP 125* 111*    Iron Studies:  Recent Labs    10/25/20 1059  FERRITIN 939*   Studies/Results: DG Knee 1-2 Views Left  Result Date: 10/25/2020 CLINICAL DATA:  Knee pain, no known injury, initial encounter EXAM: LEFT KNEE - 2 VIEW COMPARISON:  None. FINDINGS: Tricompartmental degenerative changes are noted. Moderate joint effusion is noted similar to that seen on the right. No acute fracture or dislocation is noted. IMPRESSION: Degenerative change with joint  effusion similar to that seen on the right side. Electronically Signed   By: Inez Catalina M.D.   On: 10/25/2020 13:01   DG Knee 1-2 Views Right  Result Date: 10/25/2020 CLINICAL DATA:  Bilateral knee pain EXAM: RIGHT KNEE - 2 VIEW COMPARISON:  None. FINDINGS: Tricompartmental degenerative changes are noted with lateral joint space narrowing. Moderate joint effusion is seen. No other soft tissue abnormality is seen. IMPRESSION: Degenerative change with joint effusion. Electronically Signed   By: Inez Catalina M.D.   On: 10/25/2020 13:00   CT CHEST ABDOMEN PELVIS W CONTRAST  Result Date: 10/24/2020 CLINICAL DATA:  Poor appetite, personal history of breast cancer and lung cancer EXAM: CT CHEST, ABDOMEN, AND PELVIS WITH CONTRAST TECHNIQUE: Multidetector CT imaging of the chest, abdomen and pelvis was performed following the standard protocol during bolus administration of intravenous contrast. CONTRAST:  140mL OMNIPAQUE IOHEXOL 300 MG/ML  SOLN COMPARISON:  06/13/2019 FINDINGS: CT CHEST FINDINGS Cardiovascular: The heart is mildly enlarged without pericardial effusion. Extensive atherosclerosis throughout the coronary vasculature. The aorta is normal in caliber. There is extensive atherosclerosis of the aortic arch, with moderate stenosis of the origin of the left subclavian artery. Mediastinum/Nodes: Stable subcentimeter left hilar lymph nodes. No pathologic adenopathy. Thyroid, trachea, and esophagus are unremarkable. Lungs/Pleura: Stable postsurgical changes from partial left upper lobe resection. Upper lobe predominant emphysema. Bibasilar scarring. No airspace disease, effusion, or pneumothorax. Musculoskeletal: No acute or destructive bony lesions. Reconstructed images demonstrate no additional findings. CT ABDOMEN PELVIS FINDINGS Hepatobiliary: No focal liver abnormality is seen. No gallstones, gallbladder wall thickening, or biliary dilatation. Pancreas: Unremarkable. No pancreatic ductal dilatation or  surrounding inflammatory changes. Spleen: Normal in size without focal abnormality. Adrenals/Urinary Tract: Adrenal glands are unremarkable. Kidneys are normal, without renal calculi, focal lesion, or hydronephrosis. Bladder is unremarkable. Stomach/Bowel: No bowel obstruction or ileus. Diverticulosis of the sigmoid colon without diverticulitis. Normal appendix right lower quadrant. No bowel wall thickening or inflammatory change. Vascular/Lymphatic: Infrarenal abdominal aortic aneurysm measures up to 4.8 cm in diameter, previously having measured 4.1 cm on 05/31/2017 exam. Significant atherosclerosis and mural thrombus throughout the aorta. No pathologic adenopathy. Reproductive: Uterus and bilateral adnexa are unremarkable. Other: No free fluid or free gas. No abdominal wall hernia. Musculoskeletal: No acute or destructive bony lesions. Reconstructed images demonstrate no additional findings. IMPRESSION: 1. Stable postsurgical changes from partial left upper lobe resection. 2. Infrarenal abdominal aortic aneurysm measures up to 4.8 cm in diameter, previously having measured 4.1 cm on 05/31/2017 exam. Recommend follow-up CT/MR every 6 months and vascular consultation. This recommendation follows ACR consensus guidelines: White Paper of the ACR  Incidental Findings Committee II on Vascular Findings. J Am Coll Radiol 2013; 10:789-794. 3. Diverticulosis without diverticulitis. 4. Aortic Atherosclerosis (ICD10-I70.0) and Emphysema (ICD10-J43.9). Electronically Signed   By: Randa Ngo M.D.   On: 10/24/2020 18:00   Medications: Infusions:   Scheduled Medications: . acetaminophen  650 mg Oral Q6H  . allopurinol  200 mg Oral Daily  . amoxicillin  1,000 mg Oral Q12H  . clarithromycin  500 mg Oral Q12H  . feeding supplement  237 mL Oral BID BM  . fluticasone furoate-vilanterol  1 puff Inhalation Daily   And  . umeclidinium bromide  1 puff Inhalation Daily  . furosemide  10 mg Oral Daily  . levothyroxine   100 mcg Oral Q0600  . metoprolol tartrate  12.5 mg Oral BID  . metroNIDAZOLE  500 mg Oral Q12H  . multivitamin with minerals  1 tablet Oral Daily  . nystatin  5 mL Oral TID AC & HS  . pantoprazole  40 mg Oral BID  . sodium chloride flush  3 mL Intravenous Q12H    have reviewed scheduled and prn medications.  Physical Exam: General: pleasant, NAD Heart: slightly tachy Lungs: clear Abdomen: soft, non tender Extremities: really no edema     10/26/2020,9:39 AM  LOS: 5 days

## 2020-10-26 NOTE — Consult Note (Signed)
Reason for Consult: Bilateral knee joint effusions Referring Physician: Pietro Cassis, MD - [facility]  Lisa [name] is an 86 y.o. female.  HPI: The patient is an 86 year old female who has several comorbidities that led to hospital admission.  She has been having chronic bilateral knee pain for some time now and she has reported bilateral lower extremity weakness and significant knee pain.  X-rays were obtained of both knees showing arthritic changes.  She does have a moderate effusion of both knees and orthopedic surgery is consulted for aspiration.  There is family who is a grandson at the bedside.  Past Medical History:  Diagnosis Date  . Adenocarcinoma of left lung ([place]) 2006  . Arthritis   . Asthma   . Cancer of breast, female ([place])   . Cancer of lung ([place])   . Hypertension   . Hypothyroidism   . Invasive ductal carcinoma of left breast ([place]) 1999  . Neutropenia ([place]) 06/09/2016  . Personal history of radiation therapy     Past Surgical History:  Procedure Laterality Date  . BIOPSY  10/23/2020   Procedure: BIOPSY;  Surgeon: Yetta Flock, MD;  Location: [facility] ENDOSCOPY;  Service: Gastroenterology;;  . BREAST BIOPSY     left axillary node dissection  . BREAST LUMPECTOMY Left   . COLONOSCOPY  03/08/2003   EUM:PNTIRWERXV rectal polyps destroyed with the tip of the snare/Polyps at hepatic flexure, splenic flexure at 35 cm/Left-sided diverticula: unable to retrieve path  . COLONOSCOPY  09/12/2008   QMG:QQPYPP rectum and distal sigmoid diminutive polyps/scattered left sided diverticulum. hyperplastic  . COLONOSCOPY N/A 03/06/2013   JKD:TOIZTIW polyp-removed as described above; colonic diverticulosis. hyperplastic polyps. next TCS 03/2018  . ESOPHAGOGASTRODUODENOSCOPY (EGD) WITH PROPOFOL N/A 10/23/2020   Procedure: ESOPHAGOGASTRODUODENOSCOPY (EGD) WITH PROPOFOL;  Surgeon: Yetta Flock, MD;  Location: [facility];  Service: Gastroenterology;  Laterality: N/A;  . FOOT  SURGERY    . LUNG REMOVAL, PARTIAL     upper lobe    Family History  Problem Relation Age of Onset  . Cancer Mother   . Cancer Sister   . Colon cancer Neg Hx     Social History:  reports that she quit smoking about 29 years ago. She has a 26.25 pack-year smoking history. She has never used smokeless tobacco. She reports that she does not drink alcohol and does not use drugs.  Allergies:  Allergies  Allergen Reactions  . Meloxicam Other (See Comments)    Caused an injury to the kidneys, per nephrologist  . Micardis Hct [Telmisartan-Hctz] Other (See Comments)    Caused an injury to the kidneys, per nephrologist    Medications: I have reviewed the patient's current medications.  Results for orders placed or performed during the hospital encounter of 10/21/20 (from the past 48 hour(s))  Creatinine, urine, random     Status: None   Collection Time: 10/24/20 10:35 PM  Result Value Ref Range   Creatinine, Urine 94.38 mg/dL    Comment: Performed at [facility] [address], [place], [place] [address]  Sodium, urine, random     Status: None   Collection Time: 10/24/20 10:35 PM  Result Value Ref Range   Sodium, Ur 36 mmol/L    Comment: Performed at [facility] [address], [place], [place] [address]  Osmolality, urine     Status: None   Collection Time: 10/24/20 10:36 PM  Result Value Ref Range   Osmolality, Ur 399 300 - 900 mOsm/kg  Comment: Performed at [facility], [place] [address] [place], [place] [address]  Basic metabolic panel     Status: Abnormal   Collection Time: 10/25/20  3:25 AM  Result Value Ref Range   Sodium 121 (L) 135 - 145 mmol/L   Potassium 3.9 3.5 - 5.1 mmol/L   Chloride 90 (L) 98 - 111 mmol/L   CO2 22 22 - 32 mmol/L   Glucose, Bld 99 70 - 99 mg/dL    Comment: Glucose reference range applies only to samples taken after fasting for at least 8 hours.   BUN 23 8 - 23 mg/dL   Creatinine, Ser 1.35 (H) 0.44 - 1.00 mg/dL   Calcium  8.1 (L) 8.9 - 10.3 mg/dL   GFR, Estimated 39 (L) >60 mL/min    Comment: (NOTE) Calculated using the CKD-EPI Creatinine Equation (2021)    Anion gap 9 5 - 15    Comment: Performed at [facility] [address] [place], [place] [address]  CBC with Differential/Platelet     Status: Abnormal   Collection Time: 10/25/20  3:25 AM  Result Value Ref Range   WBC 9.5 4.0 - 10.5 K/uL    Comment: REPEATED TO VERIFY   RBC 2.73 (L) 3.87 - 5.11 MIL/uL   Hemoglobin 8.1 (L) 12.0 - 15.0 g/dL   HCT 25.9 (L) 36.0 - 46.0 %   MCV 94.9 80.0 - 100.0 fL   MCH 29.7 26.0 - 34.0 pg   MCHC 31.3 30.0 - 36.0 g/dL   RDW 15.8 (H) 11.5 - 15.5 %   Platelets 101 (L) 150 - 400 K/uL    Comment: Immature Platelet Fraction may be clinically indicated, consider ordering this additional test [id number]    nRBC 0.0 0.0 - 0.2 %   Neutrophils Relative % 15 %   Neutro Abs 1.5 (L) 1.7 - 7.7 K/uL   Lymphocytes Relative 17 %   Lymphs Abs 1.6 0.7 - 4.0 K/uL   Monocytes Relative 66 %   Monocytes Absolute 6.3 (H) 0.1 - 1.0 K/uL   Eosinophils Relative 0 %   Eosinophils Absolute 0.0 0.0 - 0.5 K/uL   Basophils Relative 0 %   Basophils Absolute 0.0 0.0 - 0.1 K/uL   Immature Granulocytes 2 %   Abs Immature Granulocytes 0.16 (H) 0.00 - 0.07 K/uL    Comment: Performed at [facility],  [address], [place], [place] [address]  Magnesium     Status: None   Collection Time: 10/25/20  3:25 AM  Result Value Ref Range   Magnesium 1.7 1.7 - 2.4 mg/dL    Comment: Performed at [facility], [place] [address] [place], [place] [address]  C-reactive protein     Status: Abnormal   Collection Time: 10/25/20  3:25 AM  Result Value Ref Range   CRP 24.7 (H) <1.0 mg/dL    Comment: Performed at [facility] [address] [place], [place] [address]  Sedimentation rate     Status: Abnormal   Collection Time: 10/25/20  3:25 AM  Result Value Ref Range   Sed Rate 120 (H) 0 - 22 mm/hr    Comment: Performed at [facility] [address], [place], [place] [address]  Ferritin     Status: Abnormal   Collection Time: 10/25/20 10:59 AM  Result Value Ref Range   Ferritin 939 (H) 11 - 307 ng/mL    Comment: Performed at [facility], [address] [place], [place] [address]  Basic metabolic panel     Status: Abnormal   Collection Time: 10/26/20  2:40 AM  Result Value Ref Range   Sodium 118 (LL) 135 - 145 mmol/L    Comment: CRITICAL RESULT CALLED TO, READ BACK BY AND VERIFIED WITH: Charlie Pitter RN [phone] M GARRETT    Potassium 4.2 3.5 - 5.1 mmol/L   Chloride 89 (L) 98 - 111 mmol/L   CO2 22 22 - 32 mmol/L   Glucose, Bld 119 (H) 70 - 99 mg/dL    Comment: Glucose reference range applies only to samples taken after fasting for at least 8 hours.   BUN 33 (H) 8 - 23 mg/dL   Creatinine, Ser 1.74 (H) 0.44 - 1.00 mg/dL   Calcium 7.7 (L) 8.9 - 10.3 mg/dL   GFR, Estimated 29 (L) >60 mL/min    Comment: (NOTE) Calculated using the CKD-EPI Creatinine Equation (2021)    Anion gap 7 5 - 15    Comment: Performed at [facility] [address], [place], [place] [address]  CBC with Differential/Platelet     Status: Abnormal   Collection Time: 10/26/20  2:40 AM  Result Value Ref Range   WBC 6.5 4.0 - 10.5 K/uL    Comment: REPEATED TO VERIFY   RBC 2.66 (L) 3.87 - 5.11 MIL/uL   Hemoglobin 7.9 (L) 12.0 - 15.0 g/dL   HCT 25.1 (L) 36.0 - 46.0 %   MCV 94.4 80.0 - 100.0 fL   MCH 29.7 26.0 - 34.0 pg   MCHC 31.5 30.0 - 36.0 g/dL   RDW 15.7 (H) 11.5 - 15.5 %   Platelets 119 (L) 150 - 400 K/uL    Comment: REPEATED TO VERIFY Immature Platelet Fraction may be clinically indicated, consider ordering this additional test [id number]    nRBC 0.0 0.0 - 0.2 %   Neutrophils Relative % 25 %   Neutro Abs 1.6 (L) 1.7 - 7.7 K/uL   Lymphocytes Relative 27 %   Lymphs Abs 1.8 0.7 - 4.0 K/uL   Monocytes Relative 45 %   Monocytes Absolute 2.9 (H) 0.1 - 1.0 K/uL   Eosinophils Relative 0 %   Eosinophils  Absolute 0.0 0.0 - 0.5 K/uL   Basophils Relative 0 %   Basophils Absolute 0.0 0.0 - 0.1 K/uL   Immature Granulocytes 3 %   Abs Immature Granulocytes 0.17 (H) 0.00 - 0.07 K/uL    Comment: Performed at [facility], [place] [address], [place], [place] [address]  Magnesium     Status: None   Collection Time: 10/26/20  2:40 AM  Result Value Ref Range   Magnesium 1.9 1.7 - 2.4 mg/dL    Comment: Performed at [facility] [address], [place], [place] [address]    DG Knee 1-2 Views Left  Result Date: 10/25/2020 CLINICAL DATA:  Knee pain, no known injury, initial encounter EXAM: LEFT KNEE - 2 VIEW COMPARISON:  None. FINDINGS: Tricompartmental degenerative changes are noted. Moderate joint effusion is noted similar to that seen on the right. No acute fracture or dislocation is noted. IMPRESSION: Degenerative change with joint effusion similar to that seen on the right side. Electronically Signed   By: Inez Catalina M.D.   On: 10/25/2020 13:01   DG Knee 1-2 Views Right  Result Date: 10/25/2020 CLINICAL DATA:  Bilateral knee pain EXAM: RIGHT KNEE - 2 VIEW COMPARISON:  None. FINDINGS: Tricompartmental degenerative changes are noted with  lateral joint space narrowing. Moderate joint effusion is seen. No other soft tissue abnormality is seen. IMPRESSION: Degenerative change with joint effusion. Electronically Signed   By: Inez Catalina M.D.   On: 10/25/2020 13:00   CT CHEST ABDOMEN PELVIS W CONTRAST  Result Date: 10/24/2020 CLINICAL DATA:  Poor appetite, personal history of breast cancer and lung cancer EXAM: CT CHEST, ABDOMEN, AND PELVIS WITH CONTRAST TECHNIQUE: Multidetector CT imaging of the chest, abdomen and pelvis was performed following the standard protocol during bolus administration of intravenous contrast. CONTRAST:  160mL OMNIPAQUE IOHEXOL 300 MG/ML  SOLN COMPARISON:  06/13/2019 FINDINGS: CT CHEST FINDINGS Cardiovascular: The heart is mildly enlarged without pericardial effusion.  Extensive atherosclerosis throughout the coronary vasculature. The aorta is normal in caliber. There is extensive atherosclerosis of the aortic arch, with moderate stenosis of the origin of the left subclavian artery. Mediastinum/Nodes: Stable subcentimeter left hilar lymph nodes. No pathologic adenopathy. Thyroid, trachea, and esophagus are unremarkable. Lungs/Pleura: Stable postsurgical changes from partial left upper lobe resection. Upper lobe predominant emphysema. Bibasilar scarring. No airspace disease, effusion, or pneumothorax. Musculoskeletal: No acute or destructive bony lesions. Reconstructed images demonstrate no additional findings. CT ABDOMEN PELVIS FINDINGS Hepatobiliary: No focal liver abnormality is seen. No gallstones, gallbladder wall thickening, or biliary dilatation. Pancreas: Unremarkable. No pancreatic ductal dilatation or surrounding inflammatory changes. Spleen: Normal in size without focal abnormality. Adrenals/Urinary Tract: Adrenal glands are unremarkable. Kidneys are normal, without renal calculi, focal lesion, or hydronephrosis. Bladder is unremarkable. Stomach/Bowel: No bowel obstruction or ileus. Diverticulosis of the sigmoid colon without diverticulitis. Normal appendix right lower quadrant. No bowel wall thickening or inflammatory change. Vascular/Lymphatic: Infrarenal abdominal aortic aneurysm measures up to 4.8 cm in diameter, previously having measured 4.1 cm on 05/31/2017 exam. Significant atherosclerosis and mural thrombus throughout the aorta. No pathologic adenopathy. Reproductive: Uterus and bilateral adnexa are unremarkable. Other: No free fluid or free gas. No abdominal wall hernia. Musculoskeletal: No acute or destructive bony lesions. Reconstructed images demonstrate no additional findings. IMPRESSION: 1. Stable postsurgical changes from partial left upper lobe resection. 2. Infrarenal abdominal aortic aneurysm measures up to 4.8 cm in diameter, previously having  measured 4.1 cm on 05/31/2017 exam. Recommend follow-up CT/MR every 6 months and vascular consultation. This recommendation follows ACR consensus guidelines: White Paper of the ACR Incidental Findings Committee II on Vascular Findings. J Am Coll Radiol 2013; 10:789-794. 3. Diverticulosis without diverticulitis. 4. Aortic Atherosclerosis (ICD10-I70.0) and Emphysema (ICD10-J43.9). Electronically Signed   By: Randa Ngo M.D.   On: 10/24/2020 18:00   Both knees have tricompartmental arthritic findings on plain film per my independent review.  Both knees have valgus malalignment with moderate effusion.  Review of Systems Blood pressure (!) 104/47, pulse 96, temperature 98 F (36.7 C), temperature source Oral, resp. rate 18, height 5\' 7"  (1.702 m), weight 88.8 kg, SpO2 90 %. Physical Exam Vitals reviewed.  Musculoskeletal:     Right knee: Effusion and bony tenderness present. Abnormal alignment.     Left knee: Effusion and bony tenderness present. Abnormal alignment.  Neurological:     Mental Status: She is alert.   Both knees are warm with a moderate effusion.  Neither knee is red.  I can flex and extend both knees but it is painful.  Both knees have valgus malalignment and patellofemoral crepitation.  Assessment/Plan: Bilateral knee joint effusions with osteoarthritis and likely gout.  I was able to easily aspirate fluid from both knees.  Just clinically and grossly looking at the knees this is likely  a combination of osteoarthritis and gout.  There is no evidence of infection at all on my exam and what this fluid looks.  We will certainly send off for cell count, crystal analysis with Gram stain and culture.  I can always come by in the next 24 to 48 hours and place a steroid injection in both knees if needed.  Mcarthur Rossetti 10/26/2020, 9:14 AM

## 2020-10-27 ENCOUNTER — Other Ambulatory Visit (HOSPITAL_COMMUNITY): Payer: Medicare Other

## 2020-10-27 DIAGNOSIS — D649 Anemia, unspecified: Secondary | ICD-10-CM

## 2020-10-27 LAB — CBC WITH DIFFERENTIAL/PLATELET
Abs Immature Granulocytes: 0.19 10*3/uL — ABNORMAL HIGH (ref 0.00–0.07)
Basophils Absolute: 0 10*3/uL (ref 0.0–0.1)
Basophils Relative: 0 %
Eosinophils Absolute: 0 10*3/uL (ref 0.0–0.5)
Eosinophils Relative: 0 %
HCT: 25.1 % — ABNORMAL LOW (ref 36.0–46.0)
Hemoglobin: 7.9 g/dL — ABNORMAL LOW (ref 12.0–15.0)
Immature Granulocytes: 5 %
Lymphocytes Relative: 23 %
Lymphs Abs: 1 10*3/uL (ref 0.7–4.0)
MCH: 29.6 pg (ref 26.0–34.0)
MCHC: 31.5 g/dL (ref 30.0–36.0)
MCV: 94 fL (ref 80.0–100.0)
Monocytes Absolute: 2.2 10*3/uL — ABNORMAL HIGH (ref 0.1–1.0)
Monocytes Relative: 54 %
Neutro Abs: 0.7 10*3/uL — ABNORMAL LOW (ref 1.7–7.7)
Neutrophils Relative %: 18 %
Platelets: 127 10*3/uL — ABNORMAL LOW (ref 150–400)
RBC: 2.67 MIL/uL — ABNORMAL LOW (ref 3.87–5.11)
RDW: 15.7 % — ABNORMAL HIGH (ref 11.5–15.5)
WBC: 4.1 10*3/uL (ref 4.0–10.5)
nRBC: 0 % (ref 0.0–0.2)

## 2020-10-27 LAB — RENAL FUNCTION PANEL
Albumin: 2 g/dL — ABNORMAL LOW (ref 3.5–5.0)
Anion gap: 9 (ref 5–15)
BUN: 36 mg/dL — ABNORMAL HIGH (ref 8–23)
CO2: 21 mmol/L — ABNORMAL LOW (ref 22–32)
Calcium: 7.5 mg/dL — ABNORMAL LOW (ref 8.9–10.3)
Chloride: 89 mmol/L — ABNORMAL LOW (ref 98–111)
Creatinine, Ser: 1.62 mg/dL — ABNORMAL HIGH (ref 0.44–1.00)
GFR, Estimated: 31 mL/min — ABNORMAL LOW
Glucose, Bld: 89 mg/dL (ref 70–99)
Phosphorus: 2 mg/dL — ABNORMAL LOW (ref 2.5–4.6)
Potassium: 4.9 mmol/L (ref 3.5–5.1)
Sodium: 119 mmol/L — CL (ref 135–145)

## 2020-10-27 LAB — HEPATIC FUNCTION PANEL
ALT: 16 U/L (ref 0–44)
AST: 23 U/L (ref 15–41)
Albumin: 2.2 g/dL — ABNORMAL LOW (ref 3.5–5.0)
Alkaline Phosphatase: 44 U/L (ref 38–126)
Bilirubin, Direct: 0.2 mg/dL (ref 0.0–0.2)
Indirect Bilirubin: 0.5 mg/dL (ref 0.3–0.9)
Total Bilirubin: 0.7 mg/dL (ref 0.3–1.2)
Total Protein: 6.2 g/dL — ABNORMAL LOW (ref 6.5–8.1)

## 2020-10-27 LAB — BASIC METABOLIC PANEL
Anion gap: 9 (ref 5–15)
BUN: 31 mg/dL — ABNORMAL HIGH (ref 8–23)
CO2: 21 mmol/L — ABNORMAL LOW (ref 22–32)
Calcium: 7.9 mg/dL — ABNORMAL LOW (ref 8.9–10.3)
Chloride: 94 mmol/L — ABNORMAL LOW (ref 98–111)
Creatinine, Ser: 1.39 mg/dL — ABNORMAL HIGH (ref 0.44–1.00)
GFR, Estimated: 37 mL/min — ABNORMAL LOW (ref >60)
Glucose, Bld: 87 mg/dL (ref 70–99)
Potassium: 4.9 mmol/L (ref 3.5–5.1)
Sodium: 124 mmol/L — ABNORMAL LOW (ref 135–145)

## 2020-10-27 LAB — MAGNESIUM: Magnesium: 1.8 mg/dL (ref 1.7–2.4)

## 2020-10-27 MED ORDER — FUROSEMIDE 20 MG PO TABS
10.0000 mg | ORAL_TABLET | Freq: Every day | ORAL | Status: DC
  Administered 2020-10-27 – 2020-10-30 (×4): 10 mg via ORAL
  Filled 2020-10-27 (×4): qty 1

## 2020-10-27 MED ORDER — CLARITHROMYCIN 500 MG PO TABS
500.0000 mg | ORAL_TABLET | Freq: Two times a day (BID) | ORAL | Status: DC
  Administered 2020-10-27 – 2020-10-30 (×7): 500 mg via ORAL
  Filled 2020-10-27 (×8): qty 1

## 2020-10-27 MED ORDER — TOLVAPTAN 15 MG PO TABS
15.0000 mg | ORAL_TABLET | Freq: Once | ORAL | Status: DC
  Filled 2020-10-27: qty 1

## 2020-10-27 NOTE — Progress Notes (Signed)
Subjective:  UOP great- 2100- crt trended down slightly but sodium is still hanging out in the high teens-  Only on low dose lasix at this point to fix-  Knee asp c/w gout - no clinical changes - actually she feels better    Objective Vital signs in last 24 hours: Vitals:   10/26/20 0821 10/26/20 1410 10/26/20 2126 10/27/20 0523  BP: (!) 104/47 (!) 94/47 (!) 102/53 (!) 132/54  Pulse: 96 71 94 96  Resp:  19 16 18   Temp:  98 F (36.7 C) 98.6 F (37 C) 98.8 F (37.1 C)  TempSrc:    Oral  SpO2:  91% 90%   Weight:      Height:       Weight change:   Intake/Output Summary (Last 24 hours) at 10/27/2020 0905 Last data filed at 10/27/2020 1610 Gross per 24 hour  Intake 420 ml  Output 2100 ml  Net -1680 ml    Assessment/Plan: 86 year old BF with variable renal function and hyponatremia in the setting of hosp and mutiple medication manipulations 1.Renal- recent OP AKI felt due to ARB/HCT combo and mobic- meds stopped and AKI resolved.  Now has had recurrence of AKI- in the setting of contrasted CT on 12/23 and now urinary retention/BOO.  crt trended down today and passing urine spontaneously 2. Hyponatremia-  Her initial work up does seem c/w SIADH with elevated urine osm.  She probably had a component of hypovolemic hyponatremia or tea and toast upon admit that did improve with IVF-  Most recent change probably more due to SIADH and AKI.    I started a very low dose furosemide 10 mg PO to dilute her urine--  I also bumped up her synthroid in case hypothyroid is involved.  then with worsening sodium overnight-  It is stable but very slow to resolve.  I want to give one dose of samsca to correct-  Discussed with grandson-  Need to hold lasix and clarithro while giving but will plan to resume the lasix 10 daily for maintenane 3. Gout  - seems to be a legitimate diagnosis that is hampering her mobility-  I  increased her allopurinol -  S/p knee tap  per ortho feeling better-  If uric acid does not  correct in the future she may be a candidate for uloric  4. Anemia  - has been seen by heme onc-  Do not feel like is related to her CKD as her CKD is not that bad - is dropping here and cardiology is talking blood thinners for her A fib. Blood thinners on hold-  Looking for GIB evidence- stable last few days   Louis Meckel    Labs: Basic Metabolic Panel: Recent Labs  Lab 10/26/20 0240 10/26/20 1350 10/27/20 0151  NA 118* 119* 119*  K 4.2 4.6 4.9  CL 89* 90* 89*  CO2 22 22 21*  GLUCOSE 119* 132* 89  BUN 33* 35* 36*  CREATININE 1.74* 1.70* 1.62*  CALCIUM 7.7* 7.6* 7.5*  PHOS  --   --  2.0*   Liver Function Tests: Recent Labs  Lab 10/21/20 1123 10/27/20 0151  AST 20  --   ALT 13  --   ALKPHOS 36*  --   BILITOT 1.5*  --   PROT 8.1  --   ALBUMIN 3.3* 2.0*   No results for input(s): LIPASE, AMYLASE in the last 168 hours. No results for input(s): AMMONIA in the last 168 hours. CBC:  Recent Labs  Lab 10/23/20 0058 10/24/20 0224 10/25/20 0325 10/26/20 0240 10/27/20 0151  WBC 8.2 10.2 9.5 6.5 4.1  NEUTROABS 2.0 1.8 1.5* 1.6* 0.7*  HGB 9.6* 9.5* 8.1* 7.9* 7.9*  HCT 30.7* 31.8* 25.9* 25.1* 25.1*  MCV 95.0 99.1 94.9 94.4 94.0  PLT 67* 85* 101* 119* 127*   Cardiac Enzymes: No results for input(s): CKTOTAL, CKMB, CKMBINDEX, TROPONINI in the last 168 hours. CBG: Recent Labs  Lab 10/21/20 1119 10/21/20 1304  GLUCAP 125* 111*    Iron Studies:  Recent Labs    10/25/20 1059  FERRITIN 939*   Studies/Results: DG Chest 2 View  Result Date: 10/26/2020 CLINICAL DATA:  Shortness of breath EXAM: CHEST - 2 VIEW COMPARISON:  10/26/2020, 10:59 a.m. FINDINGS: Mild cardiomegaly. Unchanged small bilateral pleural effusions. The visualized skeletal structures are unremarkable. IMPRESSION: Mild cardiomegaly with unchanged small bilateral pleural effusions. No new airspace opacity. Electronically Signed   By: Eddie Candle M.D.   On: 10/26/2020 12:23   DG Knee 1-2 Views  Left  Result Date: 10/25/2020 CLINICAL DATA:  Knee pain, no known injury, initial encounter EXAM: LEFT KNEE - 2 VIEW COMPARISON:  None. FINDINGS: Tricompartmental degenerative changes are noted. Moderate joint effusion is noted similar to that seen on the right. No acute fracture or dislocation is noted. IMPRESSION: Degenerative change with joint effusion similar to that seen on the right side. Electronically Signed   By: Inez Catalina M.D.   On: 10/25/2020 13:01   DG Knee 1-2 Views Right  Result Date: 10/25/2020 CLINICAL DATA:  Bilateral knee pain EXAM: RIGHT KNEE - 2 VIEW COMPARISON:  None. FINDINGS: Tricompartmental degenerative changes are noted with lateral joint space narrowing. Moderate joint effusion is seen. No other soft tissue abnormality is seen. IMPRESSION: Degenerative change with joint effusion. Electronically Signed   By: Inez Catalina M.D.   On: 10/25/2020 13:00   DG Abd 1 View  Result Date: 10/26/2020 CLINICAL DATA:  Acute renal failure, abdominal distension EXAM: ABDOMEN - 1 VIEW COMPARISON:  CT abdomen and pelvis 05/31/2017 FINDINGS: Stool throughout colon, question containing retained contrast. Nonobstructive bowel gas pattern. No bowel dilatation or bowel wall thickening. Bones demineralized. Aneurysmal dilatation of the abdominal aorta, though unable to accurately assess size radiographically; patient has known abdominal aortic aneurysm and aortic tortuosity by prior CT. No definite urinary tract calcification. IMPRESSION: Nonobstructive bowel gas pattern. Aneurysmal dilatation of distal abdominal aorta though unable to at accurately assess size radiographically; patient has known abdominal aortic aneurysm by prior CT exam. Recommend ultrasound or CT assessment to assess interval change. Findings called to Dr. Clearence Ped on 10/26/2020 at 1905 hrs. Electronically Signed   By: Lavonia Dana M.D.   On: 10/26/2020 19:05   DG CHEST PORT 1 VIEW  Result Date: 10/26/2020 CLINICAL DATA:   Shortness of breath. EXAM: PORTABLE CHEST 1 VIEW COMPARISON:  10/21/2020 FINDINGS: Lordotic technique is demonstrated. Lungs are adequately inflated with hazy density over the left base/retrocardiac region which may be due to atelectasis/effusion versus prominent overlying soft tissues. Borderline stable cardiomegaly. Remainder of the exam is unchanged. IMPRESSION: Hazy density over the left base/retrocardiac region which may be due to atelectasis/effusion versus prominent overlying soft tissues. Consider PA and lateral chest x-ray for better evaluation of the left base. Electronically Signed   By: Marin Olp M.D.   On: 10/26/2020 11:05   Medications: Infusions:   Scheduled Medications: . acetaminophen  650 mg Oral Q6H  . allopurinol  200 mg Oral Daily  .  amoxicillin  1,000 mg Oral Q12H  . clarithromycin  500 mg Oral Q12H  . feeding supplement  237 mL Oral BID BM  . fluticasone furoate-vilanterol  1 puff Inhalation Daily   And  . umeclidinium bromide  1 puff Inhalation Daily  . furosemide  10 mg Oral Daily  . levothyroxine  100 mcg Oral Q0600  . metoprolol tartrate  12.5 mg Oral BID  . metroNIDAZOLE  500 mg Oral Q12H  . multivitamin with minerals  1 tablet Oral Daily  . nystatin  5 mL Oral TID AC & HS  . pantoprazole  40 mg Oral BID  . senna-docusate  1 tablet Oral QHS  . sodium chloride flush  3 mL Intravenous Q12H    have reviewed scheduled and prn medications.  Physical Exam: General: pleasant, NAD Heart: slightly tachy Lungs: clear Abdomen: soft, non tender Extremities: really no edema     10/27/2020,9:05 AM  LOS: 6 days

## 2020-10-27 NOTE — Progress Notes (Signed)
Patient ID: Lisa [name], female   DOB: 1933/11/27, 86 y.o.   MRN: [id number] The patient reports feeling much better today after having her knees aspirated yesterday.  The knee aspiration findings were consistent with gout.  She obviously has a component of osteoarthritis of the knees as well.  I did talk to her today about knee placing a steroid injection in both knees which she agreed to.  I cleaned both knees with alcohol swab and placed an injection of 3 cc of lidocaine mixed with 1 cc of Depo-Medrol in each knee which she tolerated well.  From an orthopedic standpoint, there are no further recommendations.  Call with any other questions or concerns during this hospitalization if needed.

## 2020-10-27 NOTE — Progress Notes (Signed)
PROGRESS NOTE  Lisa [name]  DOB: 08-31-1934  PCP: Celene Squibb, MD LFY:[id number]  DOA: 10/21/2020  LOS: 6 days   Chief Complaint  Patient presents with  . Weakness   Brief narrative: Patient is an 86 year old with PMH significant for breast cancer, lung cancer, hypothyroidism, gout who presented to the ED with progressive weakness, worse on the left lower extremity. She also has left lower extremity pain especially in her knee and ankle.  Patient has 72-monthhistory of poor appetite and poor oral intake. She has had recent evaluation by hematology for pancytopenia.  Also was seen by nephrology in the office for AKI thought to be secondary to ARB, hydrochlorothiazide and meloxicam, ATN. Evaluation in the ED patient was found to have AKI with a creatinine of 1.5, hyponatremia sodium down to 122.  New diagnosis of A. fib RVR.  Patient was admitted to hospitalist service for further evaluation and management. Consultation services obtained with cardiology, GI, nephrology and orthopedics.  Subjective: Patient was seen and examined this morning. Feels better today.  Bilateral knee pain is better after joint aspiration yesterday and steroid injection today.  Labs from this morning with improving creatinine, sodium level static at 119.  Noted a plan from nephrology to give a dose of Samsca today. Hemoglobin stable at 7.9.   No bowel movement in 2 days.   No wheezing on auscultation.  Not on supplemental oxygen.   Discussed with grandson at bedside.    Assessment/Plan: AKI superimposed on CKD 3b:  -Creatinine at baseline between 1.1-1.3.  Presented with an elevated creatinine of 1.53. -Related to recent use of NSAID, ARB, HCTZ and hypovolemia.  -Creatinine initially improved with IV fluid but worsened again to peak at 1.74.   -Nephrology consult appreciated. -In order to help renal function recovery, I have Lovenox, tramadol, gabapentin and colchicine on hold. -Creatinine level is  better at 1.39 this morning. Recent Labs    10/21/20 2000 10/22/20 0214 10/22/20 1859 10/23/20 0058 10/24/20 0224 10/25/20 0325 10/26/20 0240 10/26/20 1350 10/27/20 0151 10/27/20 0933  BUN 27* 25* 24* _0 33* 35* 36* 31*  CREATININE 1.17* 1.26* 1.09* 1.01* 0.95 1.35* 1.74* 1.70* 1.62* 1.39*   Hyponatremia:  -Likely a combination of SIADH and low solute intake. Fortunately, repeat CT scan chest does not show any recurrence of lung cancer. -Sodium level trend as below.  Improving with Lasix 10 mg oral daily.  Earlier today we had a plan to give Samsca but based on improved sodium level this morning, will hold Samsca and continue Lasix. -Discussed with nephrology Dr. GMoshe Cipro Recent Labs  Lab 10/21/20 2000 10/22/20 0214 10/22/20 1859 10/23/20 0058 10/24/20 0224 10/25/20 0325 10/26/20 0240 10/26/20 1350 10/27/20 0151 10/27/20 0933  NA 122* 124* 124* 125* 129* 121* 118* 119* 119* 124*   H. pylori positive -Underwent EGD on 12/21 -No evidence of bleeding but biopsy positive for H. Pylori. -12/24, I started triple therapy regimen recommended by GI.   Acute on chronic anemia -Hemoglobin at baseline more than 10.  Since admission, she has had steady drop in hemoglobin, at 7.9 today.  Full dose Lovenox on hold. No active bleeding. FOBT ordered. -12/22, EGD negative.  Diverticulosis noted in CT scan of abdomen. -GI following peripherally.  Defer to GI for any need of colonoscopy. Recent Labs    12/19/19 1051 02/26/20 1049 07/09/20 1050 10/21/20 1123 10/22/20 0820 10/23/20 0058 10/24/20 0224 10/25/20 0325 10/25/20 1059 10/26/20 0240 10/27/20 0151  HGB 11.8* 11.3* 10.9*   < >  --  9.6* 9.5* 8.1*  --  7.9* 7.9*  MCV 104.2* 106.1* 105.3*   < >  --  95.0 99.1 94.9  --  94.4 94.0  VITAMINB12 382 323 268  --  545  --   --   --   --   --   --   FOLATE 16.8  --  16.7  --   --   --   --   --   --   --   --   FERRITIN 188 200 368*  --   --   --   --   --  939*  --    --   TIBC 352 369 352  --   --   --   --   --   --   --   --   IRON 53 53 54  --   --   --   --   --   --   --   --    < > = values in this interval not displayed.   Bilateral lower extremity/knee pain -Multifactorial: Osteoarthritis, peripheral neuropathy, gout, deconditioning.  Lower extremities DVT scan negative -Elevated uric acid level on admission.  Started on colchicine and allopurinol.  -12/25, orthopedic Dr. Ninfa Linden did arthrocentesis of both knees.  Fluid analysis showed no evidence of infection but urate crystals diagnostic gouty arthritis.  -12/26, intra-articular injection of steroids given. -Pain control with Tylenol.  On hold are tramadol, gabapentin, colchicine this morning because of AKI.  A fib RVR, mild elevation troponin:  -Continue with metoprolol. -CHADSVASC score 4.  Anticoagulation on hold because of AKI and low hemoglobin.  No active bleeding. -Continue monitoring telemetry.  Elevated inflammatory markers -Present with failure to thrive, anorexia, 20 pound weight loss.   -12/23, CT scan of chest abdomen and pelvis was negative for any recurrence of malignancy. -Patient has elevated ferritins, CRP, ESR level.  Probably related to flareup of gouty arthritis.  Will recheck levels in next few days.  Hypomagnesemia -Improved with replacement.  Continue to monitor Recent Labs  Lab 10/23/20 0058 10/24/20 0224 10/25/20 0325 10/26/20 0240 10/26/20 1350 10/27/20 0151 10/27/20 0933  K 3.8 4.1 3.9 4.2 4.6 4.9 4.9  MG 1.7 1.4* 1.7 1.9  --  1.8  --   PHOS  --   --   --   --   --  2.0*  --    Thrombocytopenia:  -Improving. Recent Labs  Lab 10/21/20 1123 10/22/20 0214 10/23/20 0058 10/24/20 0224 10/25/20 0325 10/26/20 0240 10/27/20 0151  PLT 92*  89* 77* 67* 85* 101* 119* 127*   Hypothyroidism:  Continue with Synthroid.   Resting tremors of hands -outpatient neurology evaluation.  Referral will be given at discharge  Mobility: PT eval appreciated.   CIR recommended. Code Status:   Code Status: Full Code  Nutritional status: Body mass index is 30.66 kg/m. Nutrition Problem: Increased nutrient needs Etiology: acute illness Signs/Symptoms: estimated needs Diet Order            Diet regular Room service appropriate? Yes; Fluid consistency: Thin  Diet effective now                 DVT prophylaxis: Place and maintain sequential compression device Start: 10/26/20 1048 Place and maintain sequential compression device Start: 10/22/20 1202   Antimicrobials:  None Fluid: No IV fluid Consultants: None Family Communication:  Grandson at bedside.  Status is: Inpatient  Remains inpatient appropriate because: ongoing workup  Dispo: The patient is from: Home              Anticipated d/c is to: Rehab              Anticipated d/c date is: >3 days              Patient currently is not medically stable to d/c.   Infusions:    Scheduled Meds: . acetaminophen  650 mg Oral Q6H  . allopurinol  200 mg Oral Daily  . amoxicillin  1,000 mg Oral Q12H  . feeding supplement  237 mL Oral BID BM  . fluticasone furoate-vilanterol  1 puff Inhalation Daily   And  . umeclidinium bromide  1 puff Inhalation Daily  . levothyroxine  100 mcg Oral Q0600  . metoprolol tartrate  12.5 mg Oral BID  . metroNIDAZOLE  500 mg Oral Q12H  . multivitamin with minerals  1 tablet Oral Daily  . nystatin  5 mL Oral TID AC & HS  . pantoprazole  40 mg Oral BID  . senna-docusate  1 tablet Oral QHS  . sodium chloride flush  3 mL Intravenous Q12H  . tolvaptan  15 mg Oral Once    Antimicrobials: Anti-infectives (From admission, onward)   Start     Dose/Rate Route Frequency Ordered Stop   10/25/20 1100  clarithromycin (BIAXIN) tablet 500 mg  Status:  Discontinued        500 mg Oral Every 12 hours 10/25/20 1011 10/27/20 0922   10/25/20 1100  amoxicillin (AMOXIL) capsule 1,000 mg        1,000 mg Oral Every 12 hours 10/25/20 1011     10/25/20 1100  metroNIDAZOLE  (FLAGYL) tablet 500 mg        500 mg Oral Every 12 hours 10/25/20 1011        PRN meds: albuterol, ondansetron **OR** ondansetron (ZOFRAN) IV, polyethylene glycol   Objective: Vitals:   10/26/20 2126 10/27/20 0523  BP: (!) 102/53 (!) 132/54  Pulse: 94 96  Resp: 16 18  Temp: 98.6 F (37 C) 98.8 F (37.1 C)  SpO2: 90%     Intake/Output Summary (Last 24 hours) at 10/27/2020 1044 Last data filed at 10/27/2020 0647 Gross per 24 hour  Intake 420 ml  Output 2100 ml  Net -1680 ml   Filed Weights   10/22/20 1540 10/23/20 0705 10/25/20 0524  Weight: 89.4 kg 89.4 kg 88.8 kg   Weight change:  Body mass index is 30.66 kg/m.   Physical Exam: General exam: Pleasant, elderly African-American female. Not in distress.  Feels better Skin: No rashes, lesions or ulcers. HEENT: Atraumatic, normocephalic, no obvious bleeding Lungs: Clear to auscultation bilaterally CVS: Regular rate and rhythm, no murmur GI/Abd soft, nontender, nondistended, bowel sound present CNS: Alert, awake, oriented to place and person Psychiatry: Mood appropriate Extremities: No pedal edema, no calf tenderness.  Both knees feel better this morning.  Data Review: I have personally reviewed the laboratory data and studies available.  Recent Labs  Lab 10/23/20 0058 10/24/20 0224 10/25/20 0325 10/26/20 0240 10/27/20 0151  WBC 8.2 10.2 9.5 6.5 4.1  NEUTROABS 2.0 1.8 1.5* 1.6* 0.7*  HGB 9.6* 9.5* 8.1* 7.9* 7.9*  HCT 30.7* 31.8* 25.9* 25.1* 25.1*  MCV 95.0 99.1 94.9 94.4 94.0  PLT 67* 85* 101* 119* 127*   Recent Labs  Lab 10/23/20 0058 10/24/20 0224 10/25/20 0325 10/26/20 0240 10/26/20 1350 10/27/20 0151 10/27/20 0933  NA 125* 129* 121* 118* 119* 119* 124*  K 3.8 4.1 3.9 4.2 4.6 4.9 4.9  CL 93* 95* 90* 89* 90* 89* 94*  CO2 _0 21* 21*  GLUCOSE 113* 97 99 119* 132* 89 87  BUN _1 33* 35* 36* 31*  CREATININE 1.01* 0.95 1.35* 1.74* 1.70* 1.62* 1.39*  CALCIUM 8.4* 8.6* 8.1* 7.7* 7.6*  7.5* 7.9*  MG 1.7 1.4* 1.7 1.9  --  1.8  --   PHOS  --   --   --   --   --  2.0*  --    F/u labs ordered.  Signed, Terrilee Croak, MD [facility] 10/27/2020

## 2020-10-27 NOTE — Progress Notes (Signed)
Gastroenterology Progress Note  CC:  Weakness, anemia  Subjective:  Called back to see the patient because her grandson and family want to discuss a colonoscopy.  Her Hgb has been stable the past few days.  Per her nurse she had a normal, brown BM today; per the grandson "it was a little dark".  She has been eating much better.    Objective:  Vital signs in last 24 hours: Temp:  [98 F (36.7 C)-98.8 F (37.1 C)] 98.8 F (37.1 C) (12/26 0523) Pulse Rate:  [71-96] 96 (12/26 0523) Resp:  [16-19] 18 (12/26 0523) BP: (94-132)/(47-54) 132/54 (12/26 0523) SpO2:  [90 %-91 %] 90 % (12/25 2126) Last BM Date: 10/27/20 General:  Alert, Well-developed, in NAD Heart:  Regular rate and rhythm; no murmurs Pulm:  CTAB.  No W/R/R. Abdomen:  Soft, non-distended.  BS present.  Non-tender. Extremities:  Without edema. Neurologic:  Alert and oriented x 4;  grossly normal neurologically. Psych:  Alert and cooperative. Normal mood and affect.  Intake/Output from previous day: 12/25 0701 - 12/26 0700 In: 423 [P.O.:420; I.V.:3] Out: 2100 [Urine:2100] Intake/Output this shift: Total I/O In: -  Out: 900 [Urine:900]  Lab Results: Recent Labs    10/25/20 0325 10/26/20 0240 10/27/20 0151  WBC 9.5 6.5 4.1  HGB 8.1* 7.9* 7.9*  HCT 25.9* 25.1* 25.1*  PLT 101* 119* 127*   BMET Recent Labs    10/26/20 1350 10/27/20 0151 10/27/20 0933  NA 119* 119* 124*  K 4.6 4.9 4.9  CL 90* 89* 94*  CO2 22 21* 21*  GLUCOSE 132* 89 87  BUN 35* 36* 31*  CREATININE 1.70* 1.62* 1.39*  CALCIUM 7.6* 7.5* 7.9*   LFT Recent Labs    10/27/20 0933  PROT 6.2*  ALBUMIN 2.2*  AST 23  ALT 16  ALKPHOS 44  BILITOT 0.7  BILIDIR 0.2  IBILI 0.5   DG Chest 2 View  Result Date: 10/26/2020 CLINICAL DATA:  Shortness of breath EXAM: CHEST - 2 VIEW COMPARISON:  10/26/2020, 10:59 a.m. FINDINGS: Mild cardiomegaly. Unchanged small bilateral pleural effusions. The visualized skeletal structures are  unremarkable. IMPRESSION: Mild cardiomegaly with unchanged small bilateral pleural effusions. No new airspace opacity. Electronically Signed   By: Eddie Candle M.D.   On: 10/26/2020 12:23   DG Abd 1 View  Result Date: 10/26/2020 CLINICAL DATA:  Acute renal failure, abdominal distension EXAM: ABDOMEN - 1 VIEW COMPARISON:  CT abdomen and pelvis 05/31/2017 FINDINGS: Stool throughout colon, question containing retained contrast. Nonobstructive bowel gas pattern. No bowel dilatation or bowel wall thickening. Bones demineralized. Aneurysmal dilatation of the abdominal aorta, though unable to accurately assess size radiographically; patient has known abdominal aortic aneurysm and aortic tortuosity by prior CT. No definite urinary tract calcification. IMPRESSION: Nonobstructive bowel gas pattern. Aneurysmal dilatation of distal abdominal aorta though unable to at accurately assess size radiographically; patient has known abdominal aortic aneurysm by prior CT exam. Recommend ultrasound or CT assessment to assess interval change. Findings called to Dr. Clearence Ped on 10/26/2020 at 1905 hrs. Electronically Signed   By: Lavonia Dana M.D.   On: 10/26/2020 19:05   DG CHEST PORT 1 VIEW  Result Date: 10/26/2020 CLINICAL DATA:  Shortness of breath. EXAM: PORTABLE CHEST 1 VIEW COMPARISON:  10/21/2020 FINDINGS: Lordotic technique is demonstrated. Lungs are adequately inflated with hazy density over the left base/retrocardiac region which may be due to atelectasis/effusion versus prominent overlying soft tissues. Borderline stable cardiomegaly. Remainder of the exam is unchanged.  IMPRESSION: Hazy density over the left base/retrocardiac region which may be due to atelectasis/effusion versus prominent overlying soft tissues. Consider PA and lateral chest x-ray for better evaluation of the left base. Electronically Signed   By: Marin Olp M.D.   On: 10/26/2020 11:05   Assessment / Plan: 86 year old female with a history of  lung cancer, breast cancer, questionable MDS, recent worsening of renal function, A. fib with RVR, admitted to the hospital with progressive weight loss, poor appetite/anorexia.   EGD 12/22 without any high risk pathology, no malignancy or ulcers. She had some mild gastric erythema and a benign appearing duodenal nodule. Biopsies of the nodule are benign. Gastric biopsies positive for H pylori gastritis. While H pylori could be contributing to some of her symptoms, it would seem rather dramatic for this solely to be driving this presentation and do recommended further evaluation with CT imaging to ensure no malignancy.  CT scan 12/23 unremarkable for such.  *Hpylori:  Started on triple therapy as recommended by Dr. Havery Moros on 12/24.  *Acute on chronic anemia:  Hgb has been stable for the past few days at 7.9 grams today.  No sign of obvious GI bleeding.  *Hyponatremia:  Na+ up to 124 today.  [name] and family want colonoscopy to rule out any source of lower GI bleeding.  Patient not completely on board at this time. -Monitor Hgb and for signs of bleeding.    LOS: 6 days   Laban Emperor. Johneisha Broaden  10/27/2020, 1:36 PM

## 2020-10-27 NOTE — Progress Notes (Signed)
Pt has critical low sodium levels. MD notified.

## 2020-10-28 ENCOUNTER — Other Ambulatory Visit (HOSPITAL_COMMUNITY): Payer: Medicare Other

## 2020-10-28 DIAGNOSIS — E440 Moderate protein-calorie malnutrition: Secondary | ICD-10-CM | POA: Insufficient documentation

## 2020-10-28 DIAGNOSIS — N1832 Chronic kidney disease, stage 3b: Secondary | ICD-10-CM | POA: Diagnosis not present

## 2020-10-28 DIAGNOSIS — N179 Acute kidney failure, unspecified: Secondary | ICD-10-CM | POA: Diagnosis not present

## 2020-10-28 DIAGNOSIS — D500 Iron deficiency anemia secondary to blood loss (chronic): Secondary | ICD-10-CM

## 2020-10-28 LAB — CBC WITH DIFFERENTIAL/PLATELET
Abs Immature Granulocytes: 0.2 10*3/uL — ABNORMAL HIGH (ref 0.00–0.07)
Basophils Absolute: 0 10*3/uL (ref 0.0–0.1)
Basophils Relative: 0 %
Eosinophils Absolute: 0 10*3/uL (ref 0.0–0.5)
Eosinophils Relative: 0 %
HCT: 28.3 % — ABNORMAL LOW (ref 36.0–46.0)
Hemoglobin: 8.4 g/dL — ABNORMAL LOW (ref 12.0–15.0)
Immature Granulocytes: 10 %
Lymphocytes Relative: 22 %
Lymphs Abs: 0.5 10*3/uL — ABNORMAL LOW (ref 0.7–4.0)
MCH: 28.5 pg (ref 26.0–34.0)
MCHC: 29.7 g/dL — ABNORMAL LOW (ref 30.0–36.0)
MCV: 95.9 fL (ref 80.0–100.0)
Monocytes Absolute: 0.5 10*3/uL (ref 0.1–1.0)
Monocytes Relative: 24 %
Neutro Abs: 0.9 10*3/uL — ABNORMAL LOW (ref 1.7–7.7)
Neutrophils Relative %: 44 %
Platelets: 149 10*3/uL — ABNORMAL LOW (ref 150–400)
RBC: 2.95 MIL/uL — ABNORMAL LOW (ref 3.87–5.11)
RDW: 15.5 % (ref 11.5–15.5)
WBC: 2 10*3/uL — ABNORMAL LOW (ref 4.0–10.5)
nRBC: 1 % — ABNORMAL HIGH (ref 0.0–0.2)

## 2020-10-28 LAB — RENAL FUNCTION PANEL
Albumin: 2.2 g/dL — ABNORMAL LOW (ref 3.5–5.0)
Anion gap: 8 (ref 5–15)
BUN: 28 mg/dL — ABNORMAL HIGH (ref 8–23)
CO2: 23 mmol/L (ref 22–32)
Calcium: 8.4 mg/dL — ABNORMAL LOW (ref 8.9–10.3)
Chloride: 97 mmol/L — ABNORMAL LOW (ref 98–111)
Creatinine, Ser: 1.24 mg/dL — ABNORMAL HIGH (ref 0.44–1.00)
GFR, Estimated: 43 mL/min — ABNORMAL LOW (ref >60)
Glucose, Bld: 135 mg/dL — ABNORMAL HIGH (ref 70–99)
Phosphorus: 1.3 mg/dL — ABNORMAL LOW (ref 2.5–4.6)
Potassium: 4.8 mmol/L (ref 3.5–5.1)
Sodium: 128 mmol/L — ABNORMAL LOW (ref 135–145)

## 2020-10-28 MED ORDER — METOCLOPRAMIDE HCL 5 MG/ML IJ SOLN
5.0000 mg | Freq: Once | INTRAMUSCULAR | Status: AC
  Administered 2020-10-29: 5 mg via INTRAVENOUS
  Filled 2020-10-28 (×2): qty 2

## 2020-10-28 MED ORDER — PEG-KCL-NACL-NASULF-NA ASC-C 100 G PO SOLR: 0.5000 | Freq: Once | ORAL | Status: DC

## 2020-10-28 MED ORDER — POTASSIUM PHOSPHATES 15 MMOLE/5ML IV SOLN: 30.0000 mmol | Freq: Once | INTRAVENOUS | Status: DC

## 2020-10-28 MED ORDER — PANTOPRAZOLE SODIUM 40 MG PO TBEC: 40.0000 mg | DELAYED_RELEASE_TABLET | Freq: Every day | ORAL | Status: DC

## 2020-10-28 MED ORDER — PEG-KCL-NACL-NASULF-NA ASC-C 100 G PO SOLR: 1.0000 | Freq: Once | ORAL | Status: DC

## 2020-10-28 MED ORDER — POTASSIUM & SODIUM PHOSPHATES 280-160-250 MG PO PACK
1.0000 | PACK | Freq: Every day | ORAL | Status: AC
  Administered 2020-10-28 – 2020-10-30 (×3): 1 via ORAL
  Filled 2020-10-28 (×3): qty 1

## 2020-10-28 MED ORDER — METOCLOPRAMIDE HCL 5 MG/ML IJ SOLN
5.0000 mg | Freq: Once | INTRAMUSCULAR | Status: AC
  Administered 2020-10-28: 5 mg via INTRAVENOUS
  Filled 2020-10-28: qty 2

## 2020-10-28 MED ORDER — BISACODYL 5 MG PO TBEC
20.0000 mg | DELAYED_RELEASE_TABLET | Freq: Once | ORAL | Status: AC
  Administered 2020-10-28: 20 mg via ORAL
  Filled 2020-10-28: qty 4

## 2020-10-28 MED ORDER — PEG-KCL-NACL-NASULF-NA ASC-C 100 G PO SOLR
0.5000 | Freq: Once | ORAL | Status: AC
  Administered 2020-10-28: 100 g via ORAL
  Filled 2020-10-28: qty 1

## 2020-10-28 MED ORDER — SODIUM PHOSPHATES 45 MMOLE/15ML IV SOLN
30.0000 mmol | Freq: Once | INTRAVENOUS | Status: AC
  Administered 2020-10-28: 30 mmol via INTRAVENOUS
  Filled 2020-10-28: qty 10

## 2020-10-28 NOTE — Progress Notes (Signed)
PROGRESS NOTE  Lisa [name]  DOB: 08-Oct-1934  PCP: Celene Squibb, MD GYJ:[id number]  DOA: 10/21/2020  LOS: 7 days   Chief Complaint  Patient presents with  . Weakness   Brief narrative: Patient is an 86 year old with PMH significant for breast cancer, lung cancer, hypothyroidism, gout who presented to the ED with progressive weakness, worse on the left lower extremity. She also has left lower extremity pain especially in her knee and ankle.  Patient has 74-monthhistory of poor appetite and poor oral intake. She has had recent evaluation by hematology for pancytopenia.  Also was seen by nephrology in the office for AKI thought to be secondary to ARB, hydrochlorothiazide and meloxicam, ATN. Evaluation in the ED patient was found to have AKI with a creatinine of 1.5, hyponatremia sodium down to 122.  New diagnosis of A. fib RVR.  Patient was admitted to hospitalist service for further evaluation and management. Consultation services obtained with cardiology, GI, nephrology and orthopedics.  Subjective: Patient was seen and examined this morning. Elderly African-American female.  Propped up in bed.  Feels energetic today.  Both knees are hurting less today. Labs with improving sodium level, hemoglobin.  Phosphorus level low.  Replacement ordered.  Assessment/Plan: AKI superimposed on CKD 3b:  -Creatinine at baseline between 1.1-1.3.  Presented with an elevated creatinine of 1.53. -Related to recent use of NSAID, ARB, HCTZ and hypovolemia.  -Creatinine initially improved with IV fluid but worsened again to peak at 1.74.   -Nephrology consult appreciated. -In order to help renal function recovery, on hold Lovenox, tramadol, gabapentin and colchicine. -Creatinine level is better at 1.24 this morning. Recent Labs    10/22/20 0214 10/22/20 1859 10/23/20 0058 10/24/20 0224 10/25/20 0325 10/26/20 0240 10/26/20 1350 10/27/20 0151 10/27/20 0933 10/28/20 0118  BUN 25* 24* _0 33* 35* 36* 31* 28*  CREATININE 1.26* 1.09* 1.01* 0.95 1.35* 1.74* 1.70* 1.62* 1.39* 1.24*   Hyponatremia:  -Likely a combination of SIADH and low solute intake. Fortunately, repeat CT scan chest did not show any recurrence of lung cancer. -Sodium level trend as below.  Improving with Lasix 10 mg oral daily.  128 this morning.  Patient did not require Samsca. -Discussed with nephrology Dr. BCarolin Sicks Recent Labs  Lab 10/22/20 0214 10/22/20 1859 10/23/20 0058 10/24/20 0224 10/25/20 0325 10/26/20 0240 10/26/20 1350 10/27/20 0151 10/27/20 0933 10/28/20 0118  NA 124* 124* 125* 129* 121* 118* 119* 119* 124* 128*   H. pylori positive -Underwent EGD on 12/21 -No evidence of bleeding but biopsy positive for H. Pylori. -12/24, I started triple therapy regimen recommended by GI.  -Currently on Protonix twice daily.  Will switch to Protonix daily on 1/4.  Acute on chronic anemia -Hemoglobin at baseline more than 10.  Since admission, she has had steady drop in hemoglobin, to the lowest of 7.9.  No active bleeding.  Brown stool.  Hemoglobin 8.4 today. -12/22, EGD negative.  Diverticulosis noted in CT scan of abdomen.  GI follow-up appreciated.  Colonoscopy inpatient versus outpatient.  Defer to GI. Recent Labs    12/19/19 1051 02/26/20 1049 07/09/20 1050 10/21/20 1123 10/22/20 0820 10/23/20 0058 10/24/20 0224 10/25/20 0325 10/25/20 1059 10/26/20 0240 10/27/20 0151 10/28/20 0118  HGB 11.8* 11.3* 10.9*   < >  --    < > 9.5* 8.1*  --  7.9* 7.9* 8.4*  MCV 104.2* 106.1* 105.3*   < >  --    < > 99.1 94.9  --  94.4 94.0  95.9  VITAMINB12 382 323 268  --  545  --   --   --   --   --   --   --   FOLATE 16.8  --  16.7  --   --   --   --   --   --   --   --   --   FERRITIN 188 200 368*  --   --   --   --   --  939*  --   --   --   TIBC 352 369 352  --   --   --   --   --   --   --   --   --   IRON 53 53 54  --   --   --   --   --   --   --   --   --    < > = values in this interval not  displayed.   Bilateral lower extremity/knee pain -Multifactorial: Osteoarthritis, peripheral neuropathy, gout, deconditioning.  Lower extremities DVT scan negative -Elevated uric acid level on admission.  Started on colchicine and allopurinol.  -12/25, orthopedic Dr. Ninfa Linden did arthrocentesis of both knees.  Fluid analysis showed no evidence of infection but urate crystals diagnostic gouty arthritis.  -12/26, intra-articular injection of steroids given. -Pain control with Tylenol.  On hold are tramadol, gabapentin, colchicine this morning because of AKI.  A fib RVR, mild elevation troponin:  -Continue with metoprolol. -CHADSVASC score 4.  Anticoagulation on hold because of AKI and low hemoglobin.  No active bleeding. -Continue monitoring telemetry.  Elevated inflammatory markers -Present with failure to thrive, anorexia, 20 pound weight loss.   -12/23, CT scan of chest abdomen and pelvis was negative for any recurrence of malignancy. -Patient has elevated ferritins, CRP, ESR level.  Probably related to flareup of gouty arthritis.  Will recheck levels in next few days.  Hypomagnesemia/hypophosphatemia -Phosphorus level low at 1.2 this morning.  Replacement ordered.  PTH and vitamin D level ordered by nephrology. -Repeat labs tomorrow. Recent Labs  Lab 10/23/20 0058 10/24/20 0224 10/25/20 0325 10/26/20 0240 10/26/20 1350 10/27/20 0151 10/27/20 0933 10/28/20 0118  K 3.8 4.1 3.9 4.2 4.6 4.9 4.9 4.8  MG 1.7 1.4* 1.7 1.9  --  1.8  --   --   PHOS  --   --   --   --   --  2.0*  --  1.3*   Thrombocytopenia:  -Improving Recent Labs  Lab 10/22/20 0214 10/23/20 0058 10/24/20 0224 10/25/20 0325 10/26/20 0240 10/27/20 0151 10/28/20 0118  PLT 77* 67* 85* 101* 119* 127* 149*   Hypothyroidism:  Continue with Synthroid.   Moderate malnutrition -Related to acute illness.  Nutrition consult appreciated.  Nutrition supplement ordered.  Resting tremors of hands -outpatient  neurology evaluation.  Referral will be given at discharge  Mobility: PT eval appreciated.  CIR recommended. Code Status:   Code Status: Full Code  Nutritional status: Body mass index is 30.66 kg/m. Nutrition Problem: Moderate Malnutrition Etiology: acute illness Signs/Symptoms: mild muscle depletion,percent weight loss Diet Order            Diet regular Room service appropriate? Yes; Fluid consistency: Thin  Diet effective now                 DVT prophylaxis: Place and maintain sequential compression device Start: 10/26/20 1048 Place and maintain sequential compression device Start: 10/22/20 1202   Antimicrobials:  None Fluid: No IV fluid Consultants: None Family Communication:  Discussed with grandson at bedside.  Status is: Inpatient  Remains inpatient appropriate because: ongoing workup  Dispo: The patient is from: Home              Anticipated d/c is to: Rehab              Anticipated d/c date is: 2-3 days              Patient currently is not medically stable to d/c.   Infusions:  . sodium phosphate  Dextrose 5% IVPB 30 mmol (10/28/20 1121)    Scheduled Meds: . acetaminophen  650 mg Oral Q6H  . allopurinol  200 mg Oral Daily  . amoxicillin  1,000 mg Oral Q12H  . clarithromycin  500 mg Oral Q12H  . feeding supplement  237 mL Oral BID BM  . fluticasone furoate-vilanterol  1 puff Inhalation Daily   And  . umeclidinium bromide  1 puff Inhalation Daily  . furosemide  10 mg Oral Daily  . levothyroxine  100 mcg Oral Q0600  . metoprolol tartrate  12.5 mg Oral BID  . metroNIDAZOLE  500 mg Oral Q12H  . multivitamin with minerals  1 tablet Oral Daily  . nystatin  5 mL Oral TID AC & HS  . pantoprazole  40 mg Oral BID  . [START ON 11/05/2020] pantoprazole  40 mg Oral Daily  . potassium & sodium phosphates  1 packet Oral Q0600  . senna-docusate  1 tablet Oral QHS  . sodium chloride flush  3 mL Intravenous Q12H    Antimicrobials: Anti-infectives (From admission,  onward)   Start     Dose/Rate Route Frequency Ordered Stop   10/27/20 1145  clarithromycin (BIAXIN) tablet 500 mg        500 mg Oral Every 12 hours 10/27/20 1057 11/06/20 0959   10/25/20 1100  clarithromycin (BIAXIN) tablet 500 mg  Status:  Discontinued        500 mg Oral Every 12 hours 10/25/20 1011 10/27/20 0922   10/25/20 1100  amoxicillin (AMOXIL) capsule 1,000 mg        1,000 mg Oral Every 12 hours 10/25/20 1011 11/04/20 0959   10/25/20 1100  metroNIDAZOLE (FLAGYL) tablet 500 mg        500 mg Oral Every 12 hours 10/25/20 1011 11/04/20 0959      PRN meds: albuterol, ondansetron **OR** ondansetron (ZOFRAN) IV, polyethylene glycol   Objective: Vitals:   10/28/20 0547 10/28/20 1420  BP: 128/62 134/63  Pulse: 87 76  Resp: 18 19  Temp: 98.5 F (36.9 C) 98.9 F (37.2 C)  SpO2: 94% 93%    Intake/Output Summary (Last 24 hours) at 10/28/2020 1510 Last data filed at 10/28/2020 0014 Gross per 24 hour  Intake 245 ml  Output 2000 ml  Net -1755 ml   Filed Weights   10/22/20 1540 10/23/20 0705 10/25/20 0524  Weight: 89.4 kg 89.4 kg 88.8 kg   Weight change:  Body mass index is 30.66 kg/m.   Physical Exam: General exam: Pleasant, elderly African-American female. Not in distress.  Feels better Skin: No rashes, lesions or ulcers. HEENT: Atraumatic, normocephalic, no obvious bleeding Lungs: Clear to auscultation bilaterally CVS: Regular rate and rhythm, no murmur GI/Abd soft, nontender, nondistended, bowel sound present CNS: Alert, awake, oriented x3 Psychiatry: Mood appropriate Extremities: No pedal edema, no calf tenderness.  Both knees feel better  Data Review: I have personally reviewed the laboratory data  and studies available.  Recent Labs  Lab 10/24/20 0224 10/25/20 0325 10/26/20 0240 10/27/20 0151 10/28/20 0118  WBC 10.2 9.5 6.5 4.1 2.0*  NEUTROABS 1.8 1.5* 1.6* 0.7* 0.9*  HGB 9.5* 8.1* 7.9* 7.9* 8.4*  HCT 31.8* 25.9* 25.1* 25.1* 28.3*  MCV 99.1 94.9 94.4  94.0 95.9  PLT 85* 101* 119* 127* 149*   Recent Labs  Lab 10/23/20 0058 10/24/20 0224 10/25/20 0325 10/26/20 0240 10/26/20 1350 10/27/20 0151 10/27/20 0933 10/28/20 0118  NA 125* 129* 121* 118* 119* 119* 124* 128*  K 3.8 4.1 3.9 4.2 4.6 4.9 4.9 4.8  CL 93* 95* 90* 89* 90* 89* 94* 97*  CO2 _0 21* 21* 23  GLUCOSE 113* 97 99 119* 132* 89 87 135*  BUN _1 33* 35* 36* 31* 28*  CREATININE 1.01* 0.95 1.35* 1.74* 1.70* 1.62* 1.39* 1.24*  CALCIUM 8.4* 8.6* 8.1* 7.7* 7.6* 7.5* 7.9* 8.4*  MG 1.7 1.4* 1.7 1.9  --  1.8  --   --   PHOS  --   --   --   --   --  2.0*  --  1.3*   F/u labs ordered.  Signed, Terrilee Croak, MD [facility] 10/28/2020

## 2020-10-28 NOTE — Progress Notes (Addendum)
[facility] NEPHROLOGY PROGRESS NOTE  Assessment/ Plan:  #Nonoliguric AKI: Likely hemodynamically mediated in the setting of ARB, HCTZ, Mobic, IV contrast on 12/23 and urinary retention.  Urine output is 2.9 L in 24 hours and creatinine level trending down to 1.24 today.  Monitor daily lab, strict ins and outs.  Avoid nephrotoxins or hypotensive episode.  #Hyponatremia: Probably combination of both hypovolemic hyponatremia concomitant with some component of SIADH.  Serum sodium level continue to improve to 128.  Agree with low-dose Lasix.  She did not require Samsca.  Clinically asymptomatic.  #Acute on chronic anemia: Seen by GI, planning outpatient procedure.  Do not think it is caused by renal failure.  #Atrial fibrillation: On metoprolol.  #Hypophosphatemia: Agree with phosphorus repletion.  Check vitamin D and PTH level.  Encourage oral intake.  Subjective: Seen and examined at bed side.  Urine output recorded 2.9 L in 24 hours.  She denies nausea, vomiting, chest pain, shortness of breath.  No new event.  Labs improving. Objective Vital signs in last 24 hours: Vitals:   10/27/20 0523 10/27/20 1427 10/27/20 2110 10/28/20 0547  BP: (!) 132/54 127/62 124/62 128/62  Pulse: 96 78 88 87  Resp: 18 18 17 18   Temp: 98.8 F (37.1 C) 97.8 F (36.6 C) 98 F (36.7 C) 98.5 F (36.9 C)  TempSrc: Oral  Oral   SpO2:  93% 95% 94%  Weight:      Height:       Weight change:   Intake/Output Summary (Last 24 hours) at 10/28/2020 0953 Last data filed at 10/28/2020 0014 Gross per 24 hour  Intake 245 ml  Output 2900 ml  Net -2655 ml       Labs: Basic Metabolic Panel: Recent Labs  Lab 10/27/20 0151 10/27/20 0933 10/28/20 0118  NA 119* 124* 128*  K 4.9 4.9 4.8  CL 89* 94* 97*  CO2 21* 21* 23  GLUCOSE 89 87 135*  BUN 36* 31* 28*  CREATININE 1.62* 1.39* 1.24*  CALCIUM 7.5* 7.9* 8.4*  PHOS 2.0*  --  1.3*   Liver Function Tests: Recent Labs  Lab 10/21/20 1123  10/27/20 0151 10/27/20 0933 10/28/20 0118  AST 20  --  23  --   ALT 13  --  16  --   ALKPHOS 36*  --  44  --   BILITOT 1.5*  --  0.7  --   PROT 8.1  --  6.2*  --   ALBUMIN 3.3* 2.0* 2.2* 2.2*   No results for input(s): LIPASE, AMYLASE in the last 168 hours. No results for input(s): AMMONIA in the last 168 hours. CBC: Recent Labs  Lab 10/24/20 0224 10/25/20 0325 10/26/20 0240 10/27/20 0151 10/28/20 0118  WBC 10.2 9.5 6.5 4.1 2.0*  NEUTROABS 1.8 1.5* 1.6* 0.7* 0.9*  HGB 9.5* 8.1* 7.9* 7.9* 8.4*  HCT 31.8* 25.9* 25.1* 25.1* 28.3*  MCV 99.1 94.9 94.4 94.0 95.9  PLT 85* 101* 119* 127* 149*   Cardiac Enzymes: No results for input(s): CKTOTAL, CKMB, CKMBINDEX, TROPONINI in the last 168 hours. CBG: Recent Labs  Lab 10/21/20 1119 10/21/20 1304  GLUCAP 125* 111*    Iron Studies:  Recent Labs    10/25/20 1059  FERRITIN 939*   Studies/Results: DG Chest 2 View  Result Date: 10/26/2020 CLINICAL DATA:  Shortness of breath EXAM: CHEST - 2 VIEW COMPARISON:  10/26/2020, 10:59 a.m. FINDINGS: Mild cardiomegaly. Unchanged small bilateral pleural effusions. The visualized skeletal structures are unremarkable. IMPRESSION: Mild cardiomegaly  with unchanged small bilateral pleural effusions. No new airspace opacity. Electronically Signed   By: Eddie Candle M.D.   On: 10/26/2020 12:23   DG Abd 1 View  Result Date: 10/26/2020 CLINICAL DATA:  Acute renal failure, abdominal distension EXAM: ABDOMEN - 1 VIEW COMPARISON:  CT abdomen and pelvis 05/31/2017 FINDINGS: Stool throughout colon, question containing retained contrast. Nonobstructive bowel gas pattern. No bowel dilatation or bowel wall thickening. Bones demineralized. Aneurysmal dilatation of the abdominal aorta, though unable to accurately assess size radiographically; patient has known abdominal aortic aneurysm and aortic tortuosity by prior CT. No definite urinary tract calcification. IMPRESSION: Nonobstructive bowel gas pattern.  Aneurysmal dilatation of distal abdominal aorta though unable to at accurately assess size radiographically; patient has known abdominal aortic aneurysm by prior CT exam. Recommend ultrasound or CT assessment to assess interval change. Findings called to Dr. Clearence Ped on 10/26/2020 at 1905 hrs. Electronically Signed   By: Lavonia Dana M.D.   On: 10/26/2020 19:05   DG CHEST PORT 1 VIEW  Result Date: 10/26/2020 CLINICAL DATA:  Shortness of breath. EXAM: PORTABLE CHEST 1 VIEW COMPARISON:  10/21/2020 FINDINGS: Lordotic technique is demonstrated. Lungs are adequately inflated with hazy density over the left base/retrocardiac region which may be due to atelectasis/effusion versus prominent overlying soft tissues. Borderline stable cardiomegaly. Remainder of the exam is unchanged. IMPRESSION: Hazy density over the left base/retrocardiac region which may be due to atelectasis/effusion versus prominent overlying soft tissues. Consider PA and lateral chest x-ray for better evaluation of the left base. Electronically Signed   By: Marin Olp M.D.   On: 10/26/2020 11:05    Medications: Infusions: . sodium phosphate  Dextrose 5% IVPB      Scheduled Medications: . acetaminophen  650 mg Oral Q6H  . allopurinol  200 mg Oral Daily  . amoxicillin  1,000 mg Oral Q12H  . clarithromycin  500 mg Oral Q12H  . feeding supplement  237 mL Oral BID BM  . fluticasone furoate-vilanterol  1 puff Inhalation Daily   And  . umeclidinium bromide  1 puff Inhalation Daily  . furosemide  10 mg Oral Daily  . levothyroxine  100 mcg Oral Q0600  . metoprolol tartrate  12.5 mg Oral BID  . metroNIDAZOLE  500 mg Oral Q12H  . multivitamin with minerals  1 tablet Oral Daily  . nystatin  5 mL Oral TID AC & HS  . pantoprazole  40 mg Oral BID  . potassium & sodium phosphates  1 packet Oral Q0600  . senna-docusate  1 tablet Oral QHS  . sodium chloride flush  3 mL Intravenous Q12H    have reviewed scheduled and prn  medications.  Physical Exam: General:NAD, comfortable Heart:RRR, s1s2 nl Lungs:clear b/l, no crackle Abdomen:soft, Non-tender, non-distended Extremities:No LE edema Neurology: Alert awake and following commands  Lisa [name] 10/28/2020,9:53 AM  LOS: 7 days

## 2020-10-28 NOTE — Progress Notes (Signed)
Occupational Therapy Treatment Patient Details Name: Lisa [name] MRN: [id number] DOB: 27-Jun-1934 Today's Date: 10/28/2020    History of present illness Lisa [name] is an 86 yo female with PMHx breast cancer, lung cancer, hypothyroidism, gout, who presents to the ER with progressively worsening weakness notably in her left lower extremity; Pain in RLE and  Afib with RVR. Pt with decline in the past 3 weeks after being taken off gout medication due to poor kidney function.   OT comments  Excellent progress during therapy today. Pt was eager to participate and able to perform bed mobility at min guard for lines only. Pt needs more education for use of RW for transfers and mobility. Today was mod A +2 for sit<>stand pulling up on RW with therapists weighing down for stability. Once upright Pt was able to ambulate in room with +2 assist with cues for postures and safety with RW. IN the recliner, Pt was able to demonstrate increased access to LB for ADL as she was able to don/doff a sock without assist. POC remains appropriate and CIR is essential to maximize safety and independence in ADL and functional transfers.    Follow Up Recommendations  CIR;Supervision/Assistance - 24 hour    Equipment Recommendations  3 in 1 bedside commode;Wheelchair (measurements OT);Wheelchair cushion (measurements OT)    Recommendations for Other Services PT consult;Rehab consult    Precautions / Restrictions Precautions Precautions: Fall Restrictions Weight Bearing Restrictions: No       Mobility Bed Mobility Overal bed mobility: Needs Assistance Bed Mobility: Supine to Sit     Supine to sit: Min guard;HOB elevated     General bed mobility comments: no physical assist needed, min guard for lines  Transfers Overall transfer level: Needs assistance Equipment used: Rolling walker (2 wheeled) Transfers: Sit to/from Stand Sit to Stand: Mod assist;+2 physical assistance;+2 safety/equipment          General transfer comment: Pt needed a lot of education on safety and use of RW - cues for hand placement.    Balance Overall balance assessment: Needs assistance Sitting-balance support: No upper extremity supported;Feet supported Sitting balance-Leahy Scale: Fair     Standing balance support: Bilateral upper extremity supported;During functional activity Standing balance-Leahy Scale: Poor Standing balance comment: reliant on external support                           ADL either performed or assessed with clinical judgement   ADL Overall ADL's : Needs assistance/impaired Eating/Feeding: Set up;Sitting   Grooming: Wash/dry hands;Wash/dry face;Set up;Sitting Grooming Details (indicate cue type and reason): in recliner             Lower Body Dressing: Moderate assistance;Sit to/from stand;+2 for safety/equipment Lower Body Dressing Details (indicate cue type and reason): Pt able to don and doff sock without assist from seated position - will need mod A for articles of clothing requring sit<>stand Toilet Transfer: Moderate assistance;+2 for safety/equipment;BSC;RW Toilet Transfer Details (indicate cue type and reason): pulls up on RW - needs vc for safety and instruction on how to use RW safely mod A +2 for boost         Functional mobility during ADLs: Minimal assistance;+2 for safety/equipment;Rolling walker;Cueing for sequencing;Cueing for safety General ADL Comments: Improved pain management due to injections in Bil knees yesterday. improved access to LB for ADL from seated position. Needs significant instruction on how to use RW     Vision  Perception     Praxis      Cognition Arousal/Alertness: Awake/alert Behavior During Therapy: WFL for tasks assessed/performed Overall Cognitive Status: Within Functional Limits for tasks assessed                                          Exercises     Shoulder Instructions       General  Comments SpO2 at 92% sitting after ambulation, SOB with activity    Pertinent Vitals/ Pain       Pain Assessment: Faces Faces Pain Scale: Hurts a little bit Pain Location: Bil Knees Pain Descriptors / Indicators: Sore Pain Intervention(s): Monitored during session;Repositioned  Home Living                                          Prior Functioning/Environment              Frequency  Min 2X/week        Progress Toward Goals  OT Goals(current goals can now be found in the care plan section)  Progress towards OT goals: Progressing toward goals  Acute Rehab OT Goals Patient Stated Goal: get stronger and walk better again OT Goal Formulation: With patient Time For Goal Achievement: 11/06/20 Potential to Achieve Goals: Good  Plan Discharge plan remains appropriate;Frequency remains appropriate    Co-evaluation    PT/OT/SLP Co-Evaluation/Treatment: Yes Reason for Co-Treatment: For patient/therapist safety;To address functional/ADL transfers PT goals addressed during session: Mobility/safety with mobility;Balance;Proper use of DME;Strengthening/ROM OT goals addressed during session: ADL's and self-care;Proper use of Adaptive equipment and DME      AM-PAC OT "6 Clicks" Daily Activity     Outcome Measure   Help from another person eating meals?: None Help from another person taking care of personal grooming?: A Little Help from another person toileting, which includes using toliet, bedpan, or urinal?: A Lot Help from another person bathing (including washing, rinsing, drying)?: A Lot Help from another person to put on and taking off regular upper body clothing?: A Little Help from another person to put on and taking off regular lower body clothing?: A Lot 6 Click Score: 16    End of Session Equipment Utilized During Treatment: Rolling walker  OT Visit Diagnosis: Unsteadiness on feet (R26.81);Muscle weakness (generalized) (M62.81);Pain Pain -  Right/Left:  (BIlateral) Pain - part of body: Knee   Activity Tolerance Patient tolerated treatment well   Patient Left in chair;with call bell/phone within reach;with chair alarm set   Nurse Communication Mobility status        Time: 1751-0258 OT Time Calculation (min): 30 min  Charges: OT General Charges $OT Visit: 1 Visit OT Treatments $Self Care/Home Management : 8-22 mins  Jesse Sans OTR/L Acute Rehabilitation Services Pager: [phone] Office: [place] 10/28/2020, 12:09 PM

## 2020-10-28 NOTE — Plan of Care (Signed)
  Problem: Education: Goal: Knowledge of General Education information will improve Description Including pain rating scale, medication(s)/side effects and non-pharmacologic comfort measures Outcome: Progressing   

## 2020-10-28 NOTE — Progress Notes (Signed)
Inpatient Rehab Admissions Coordinator:   I have no beds available for this patient to admit to CIR today.  Will continue to follow for timing of potential admission pending bed availability. I updated patient over the phone.   Shann Medal, PT, DPT Admissions Coordinator [phone] 10/28/20  11:26 AM

## 2020-10-28 NOTE — Progress Notes (Addendum)
Daily Rounding Note  10/28/2020, 10:00 AM  LOS: 7 days   SUBJECTIVE:   Chief complaint:  Anemia.   No nausea, no vomiting.  No abd pain.  Large, soft semi-formed brown stool yesterday per RN.   Assuming on average 80% of meals. Pt still has pain in her legs  OBJECTIVE:         Vital signs in last 24 hours:    Temp:  [97.8 F (36.6 C)-98.5 F (36.9 C)] 98.5 F (36.9 C) (12/27 0547) Pulse Rate:  [78-88] 87 (12/27 0547) Resp:  [17-18] 18 (12/27 0547) BP: (124-128)/(62) 128/62 (12/27 0547) SpO2:  [93 %-95 %] 94 % (12/27 0547) Last BM Date: 10/27/20 Filed Weights   10/22/20 1540 10/23/20 0705 10/25/20 0524  Weight: 89.4 kg 89.4 kg 88.8 kg   General: pleasant, alert, appropriate   Heart: RRR Chest: reduced BS at bases but clear, no cough or labored breathing.   Abdomen: soft, active BS.  ND.  NT.    Extremities: no CCE Neuro/Psych:  Oriented to self, place, person.    Intake/Output from previous day: 12/26 0701 - 12/27 0700 In: 245 [P.O.:245] Out: 2900 [Urine:2900]  Intake/Output this shift: No intake/output data recorded.  Lab Results: Recent Labs    10/26/20 0240 10/27/20 0151 10/28/20 0118  WBC 6.5 4.1 2.0*  HGB 7.9* 7.9* 8.4*  HCT 25.1* 25.1* 28.3*  PLT 119* 127* 149*   BMET Recent Labs    10/27/20 0151 10/27/20 0933 10/28/20 0118  NA 119* 124* 128*  K 4.9 4.9 4.8  CL 89* 94* 97*  CO2 21* 21* 23  GLUCOSE 89 87 135*  BUN 36* 31* 28*  CREATININE 1.62* 1.39* 1.24*  CALCIUM 7.5* 7.9* 8.4*   LFT Recent Labs    10/27/20 0151 10/27/20 0933 10/28/20 0118  PROT  --  6.2*  --   ALBUMIN 2.0* 2.2* 2.2*  AST  --  23  --   ALT  --  16  --   ALKPHOS  --  44  --   BILITOT  --  0.7  --   BILIDIR  --  0.2  --   IBILI  --  0.5  --    PT/INR No results for input(s): LABPROT, INR in the last 72 hours. Hepatitis Panel No results for input(s): HEPBSAG, HCVAB, HEPAIGM, HEPBIGM in the last 72  hours.  Studies/Results: DG Chest 2 View  Result Date: 10/26/2020 CLINICAL DATA:  Shortness of breath EXAM: CHEST - 2 VIEW COMPARISON:  10/26/2020, 10:59 a.m. FINDINGS: Mild cardiomegaly. Unchanged small bilateral pleural effusions. The visualized skeletal structures are unremarkable. IMPRESSION: Mild cardiomegaly with unchanged small bilateral pleural effusions. No new airspace opacity. Electronically Signed   By: Eddie Candle M.D.   On: 10/26/2020 12:23   DG Abd 1 View  Result Date: 10/26/2020 CLINICAL DATA:  Acute renal failure, abdominal distension EXAM: ABDOMEN - 1 VIEW COMPARISON:  CT abdomen and pelvis 05/31/2017 FINDINGS: Stool throughout colon, question containing retained contrast. Nonobstructive bowel gas pattern. No bowel dilatation or bowel wall thickening. Bones demineralized. Aneurysmal dilatation of the abdominal aorta, though unable to accurately assess size radiographically; patient has known abdominal aortic aneurysm and aortic tortuosity by prior CT. No definite urinary tract calcification. IMPRESSION: Nonobstructive bowel gas pattern. Aneurysmal dilatation of distal abdominal aorta though unable to at accurately assess size radiographically; patient has known abdominal aortic aneurysm by prior CT exam. Recommend ultrasound or CT assessment to assess  interval change. Findings called to Dr. Clearence Ped on 10/26/2020 at 1905 hrs. Electronically Signed   By: Lavonia Dana M.D.   On: 10/26/2020 19:05   DG CHEST PORT 1 VIEW  Result Date: 10/26/2020 CLINICAL DATA:  Shortness of breath. EXAM: PORTABLE CHEST 1 VIEW COMPARISON:  10/21/2020 FINDINGS: Lordotic technique is demonstrated. Lungs are adequately inflated with hazy density over the left base/retrocardiac region which may be due to atelectasis/effusion versus prominent overlying soft tissues. Borderline stable cardiomegaly. Remainder of the exam is unchanged. IMPRESSION: Hazy density over the left base/retrocardiac region which may  be due to atelectasis/effusion versus prominent overlying soft tissues. Consider PA and lateral chest x-ray for better evaluation of the left base. Electronically Signed   By: Marin Olp M.D.   On: 10/26/2020 11:05   Scheduled Meds: . acetaminophen  650 mg Oral Q6H  . allopurinol  200 mg Oral Daily  . amoxicillin  1,000 mg Oral Q12H  . clarithromycin  500 mg Oral Q12H  . feeding supplement  237 mL Oral BID BM  . fluticasone furoate-vilanterol  1 puff Inhalation Daily   And  . umeclidinium bromide  1 puff Inhalation Daily  . furosemide  10 mg Oral Daily  . levothyroxine  100 mcg Oral Q0600  . metoprolol tartrate  12.5 mg Oral BID  . metroNIDAZOLE  500 mg Oral Q12H  . multivitamin with minerals  1 tablet Oral Daily  . nystatin  5 mL Oral TID AC & HS  . pantoprazole  40 mg Oral BID  . potassium & sodium phosphates  1 packet Oral Q0600  . senna-docusate  1 tablet Oral QHS  . sodium chloride flush  3 mL Intravenous Q12H   Continuous Infusions: . sodium phosphate  Dextrose 5% IVPB     PRN Meds:.albuterol, ondansetron **OR** ondansetron (ZOFRAN) IV, polyethylene glycol  ASSESMENT:   *   1 month anorexia, brief N/V, wt loss.  Anemia.  No evidence of iron, folate, B12 deficiency.  Hgb stable.  No transfusions to date.  No clear evidence of active GI bleeding.  Renal not convinced renal dysfx is causing anemia.   10/23/2020 EGD, mild gastric erythema and benign duodenal nodule.  Biopsies positive for H. pylori, currently on twice daily PPI, metronidazole, Amoxil, Biaxin eradication protocol day 4.  CT 12/23 - for intestinal abnormalities, GI malignancy. 2014 colonoscopy: Diminutive hyperplastic polyps, scattered left diverticulosis Family requesting colonoscopy  *     Hyponatremia.  Hypovolemic hyponatremia and SIADH. Na improved 118 >> 128.    *    AKI.  Improved.     PLAN   *    Colonoscopy?  If outpt:  return to her GI primary: Dr Rehman/[facility].  At present pt appears  able to tolerate bowel prep.    *   Pleat 14 days of H. pylori triple therapy.  Currently day 4.  Resume Protonix 40 mg/daily on 1/4    Azucena Freed  10/28/2020, 10:00 AM Phone [phone]   Attending physician's note   I have taken an interval history, reviewed the chart and examined the patient. I agree with the Advanced Practitioner's note, impression and recommendations.   Discussed in detail with the patient and patient's grandson (who is doing internal medicine/psych residency at [employer] [employer] [facility])  They would like further WU for anemia by means of colonoscopy while she is here.  I have discussed risks and benefits.  Would proceed with colonoscopy in a.m.   If further work-up is needed  by means of VCE, then that can be performed as an outpt  Recheck electrolytes in a.m. (Na 128 today)  Carmell Austria, MD [facility] [phone]

## 2020-10-28 NOTE — Progress Notes (Signed)
Nutrition Follow Up  DOCUMENTATION CODES:   Non-severe (moderate) malnutrition in context of acute illness/injury  INTERVENTION:   Pt at risk for continued refeeding. MD to replace electrolytes as needed.    Ensure Enlive po BID, each supplement provides 350 kcal and 20 grams of protein  Magic cup TID with meals, each supplement provides 290 kcal and 9 grams of protein  MVI daily   NUTRITION DIAGNOSIS:   Moderate Malnutrition related to acute illness as evidenced by mild muscle depletion,percent weight loss.  Ongoing  GOAL:   Patient will meet greater than or equal to 90% of their needs   Progressing  MONITOR:   PO intake,Supplement acceptance,Weight trends,I & O's,Labs  REASON FOR ASSESSMENT:   Consult Assessment of nutrition requirement/status  ASSESSMENT:   Patient with PMH significant for breast cancer, lung cancer, and gout. Presents this admission with hyponatremia and acute metabolic encephalopathy.   12/22- EGD, mild gastric erythema and benign duodenal nodule  Appetite significantly improved. Last three meals documented as 80%. Pt denies poor appetite PTA, but family states otherwise. Pt enjoys Ensure and would like to continue them.   Will current evidence pt meets criteria for moderate malnutrition in an acute setting. Once able to obtain specifics on intake PTA, may meet criteria for severe malnutrition.   Pt at risk for continued refeeding. MD to replace electrolytes as needed.   UOP: 2900 ml x 24 hrs   Drips: sodium phosphate in D5  Medications: 10 mg lasix daily, Phos nak, senokot  Labs: Na 128 (L) Phosphorus 1.3 (L) CBG 87-132  Flowsheet Row Most Recent Value  Orbital Region No depletion  Upper Arm Region No depletion  Thoracic and Lumbar Region Unable to assess  Buccal Region Mild depletion  Temple Region Mild depletion  Clavicle Bone Region Moderate depletion  Clavicle and Acromion Bone Region Mild depletion  Scapular Bone Region  Unable to assess  Dorsal Hand No depletion  Patellar Region No depletion  Anterior Thigh Region No depletion  Posterior Calf Region No depletion  Edema (RD Assessment) Mild  Hair Reviewed  Eyes Reviewed  Mouth Reviewed  Skin Reviewed  Nails Reviewed     Diet Order:   Diet Order            Diet regular Room service appropriate? Yes; Fluid consistency: Thin  Diet effective now                 EDUCATION NEEDS:   Not appropriate for education at this time  Skin:  Skin Assessment: Reviewed RN Assessment  Last BM:  12/26  Height:   Ht Readings from Last 1 Encounters:  10/23/20 5\' 7"  (1.702 m)    Weight:   Wt Readings from Last 1 Encounters:  10/25/20 88.8 kg   BMI:  Body mass index is 30.66 kg/m.  Estimated Nutritional Needs:   Kcal:  1800-2000 kcal  Protein:  90-105 grams  Fluid:  >/= 1.8 L/day  Mariana Single RD, LDN Clinical Nutrition Pager listed in [place]

## 2020-10-28 NOTE — Progress Notes (Signed)
Pt family had more question for GI Dr. So nurse paged GI [phone] @ 1720 no responds. So family decided to hold off on the Moviprep tonight until GI consults tomorrow.

## 2020-10-28 NOTE — Progress Notes (Signed)
Physical Therapy Treatment Patient Details Name: Tammara Massing MRN: [id number] DOB: [birth year] Today's Date: 10/28/2020    History of Present Illness Ms Harshman is an 86 yo female with PMHx breast cancer, lung cancer, hypothyroidism, gout, who presents to the ER with progressively worsening weakness notably in her left lower extremity; Pain in RLE and  Afib with RVR. Pt with decline in the past 3 weeks after being taken off gout medication due to poor kidney function.    PT Comments    Pt was seen for mobility on RW with close guard wc, able to cover 18' before requiring to sit.  Pt needs frequent cues for posture, sequencing walker and maintaining safety with obstacles and hand placement in transitions.  Her familiarity with the walker is low.  Follow up with her to work on longer trips via LE strengthening, balance training and monitoring her for pain on LE's.  Her knees were aspirated on 12/25, now much less painful.  Follow along for acute PT goals.  Follow Up Recommendations  CIR     Equipment Recommendations  Other (comment)    Recommendations for Other Services Rehab consult     Precautions / Restrictions Precautions Precautions: Fall Precaution Comments: unsteady knees with recently injected joints Restrictions Weight Bearing Restrictions: No    Mobility  Bed Mobility Overal bed mobility: Needs Assistance Bed Mobility: Supine to Sit     Supine to sit: Min guard;HOB elevated     General bed mobility comments: no physical assist needed, min guard for lines  Transfers Overall transfer level: Needs assistance Equipment used: Rolling walker (2 wheeled) Transfers: Sit to/from Stand Sit to Stand: Mod assist;+2 safety/equipment;+2 physical assistance         General transfer comment: Pt needed a lot of education on safety and use of RW - cues for hand placement.  Ambulation/Gait Ambulation/Gait assistance: Min assist Gait Distance (Feet): 18 Feet Assistive  device: Rolling walker (2 wheeled);2 person hand held assist Gait Pattern/deviations: Step-through pattern;Step-to pattern;Decreased stride length;Wide base of support;Trunk flexed Gait velocity: reduced, variable Gait velocity interpretation: <1.8 ft/sec, indicate of risk for recurrent falls General Gait Details: requires frequent instructions for clearing walker from obstacles and maintaining space between she and walker   Stairs             Wheelchair Mobility    Modified Rankin (Stroke Patients Only) Modified Rankin (Stroke Patients Only) Pre-Morbid Rankin Score: Moderate disability Modified Rankin: Moderately severe disability     Balance Overall balance assessment: Needs assistance Sitting-balance support: Feet supported Sitting balance-Leahy Scale: Fair     Standing balance support: Bilateral upper extremity supported;During functional activity Standing balance-Leahy Scale: Poor Standing balance comment: reliant on external support                            Cognition Arousal/Alertness: Awake/alert Behavior During Therapy: WFL for tasks assessed/performed Overall Cognitive Status: Within Functional Limits for tasks assessed                                        Exercises General Exercises - Lower Extremity Ankle Circles/Pumps: AAROM;5 reps Quad Sets: AROM;10 reps Gluteal Sets: AROM;10 reps Hip ABduction/ADduction: AAROM;10 reps    General Comments General comments (skin integrity, edema, etc.): O2 sat 92% post gait, SOB on room air      Pertinent Vitals/Pain Pain Assessment:  Faces Faces Pain Scale: Hurts a little bit Pain Location: Bil Knees Pain Descriptors / Indicators: Sore Pain Intervention(s): Limited activity within patient's tolerance;Monitored during session;Premedicated before session;Repositioned    Home Living                      Prior Function            PT Goals (current goals can now be found  in the care plan section) Acute Rehab PT Goals Patient Stated Goal: get stronger and walk better again Progress towards PT goals: Progressing toward goals    Frequency    Min 3X/week      PT Plan Current plan remains appropriate    Co-evaluation   Reason for Co-Treatment: For patient/therapist safety;To address functional/ADL transfers PT goals addressed during session: Mobility/safety with mobility;Balance;Proper use of DME OT goals addressed during session: ADL's and self-care;Proper use of Adaptive equipment and DME      AM-PAC PT "6 Clicks" Mobility   Outcome Measure  Help needed turning from your back to your side while in a flat bed without using bedrails?: A Little Help needed moving from lying on your back to sitting on the side of a flat bed without using bedrails?: A Little Help needed moving to and from a bed to a chair (including a wheelchair)?: A Little Help needed standing up from a chair using your arms (e.g., wheelchair or bedside chair)?: A Lot Help needed to walk in hospital room?: A Lot Help needed climbing 3-5 steps with a railing? : Total 6 Click Score: 14    End of Session Equipment Utilized During Treatment: Gait belt Activity Tolerance: Patient limited by fatigue;Patient limited by pain Patient left: in chair;with call bell/phone within reach;with chair alarm set Nurse Communication: Mobility status PT Visit Diagnosis: Other abnormalities of gait and mobility (R26.89);Pain;Difficulty in walking, not elsewhere classified (R26.2);Muscle weakness (generalized) (M62.81)     Time: 9390-3009 PT Time Calculation (min) (ACUTE ONLY): 30 min  Charges:  $Gait Training: 8-22 mins                  Ramond Dial 10/28/2020, 1:12 PM  Mee Hives, PT MS Acute Rehab Dept. Number: [place] [facility] [place]

## 2020-10-29 ENCOUNTER — Encounter (HOSPITAL_COMMUNITY): Payer: Self-pay | Admitting: Anesthesiology

## 2020-10-29 ENCOUNTER — Encounter (HOSPITAL_COMMUNITY)
Admission: EM | Disposition: A | Discharge status: Rehabilitation Facility | Payer: Self-pay | Source: Home / Self Care | Attending: Internal Medicine

## 2020-10-29 DIAGNOSIS — N1832 Chronic kidney disease, stage 3b: Secondary | ICD-10-CM | POA: Diagnosis not present

## 2020-10-29 DIAGNOSIS — N179 Acute kidney failure, unspecified: Secondary | ICD-10-CM | POA: Diagnosis not present

## 2020-10-29 LAB — CBC WITH DIFFERENTIAL/PLATELET
Abs Immature Granulocytes: 0.57 10*3/uL — ABNORMAL HIGH (ref 0.00–0.07)
Basophils Absolute: 0 10*3/uL (ref 0.0–0.1)
Basophils Relative: 0 %
Eosinophils Absolute: 0 10*3/uL (ref 0.0–0.5)
Eosinophils Relative: 0 %
HCT: 27.8 % — ABNORMAL LOW (ref 36.0–46.0)
Hemoglobin: 8.4 g/dL — ABNORMAL LOW (ref 12.0–15.0)
Immature Granulocytes: 14 %
Lymphocytes Relative: 18 %
Lymphs Abs: 0.7 10*3/uL (ref 0.7–4.0)
MCH: 28.8 pg (ref 26.0–34.0)
MCHC: 30.2 g/dL (ref 30.0–36.0)
MCV: 95.2 fL (ref 80.0–100.0)
Monocytes Absolute: 0.8 10*3/uL (ref 0.1–1.0)
Monocytes Relative: 20 %
Neutro Abs: 1.9 10*3/uL (ref 1.7–7.7)
Neutrophils Relative %: 48 %
Platelets: 162 10*3/uL (ref 150–400)
RBC: 2.92 MIL/uL — ABNORMAL LOW (ref 3.87–5.11)
RDW: 15.6 % — ABNORMAL HIGH (ref 11.5–15.5)
WBC: 4.1 10*3/uL (ref 4.0–10.5)
nRBC: 1.2 % — ABNORMAL HIGH (ref 0.0–0.2)

## 2020-10-29 LAB — RENAL FUNCTION PANEL
Albumin: 2.5 g/dL — ABNORMAL LOW (ref 3.5–5.0)
Anion gap: 9 (ref 5–15)
BUN: 23 mg/dL (ref 8–23)
CO2: 22 mmol/L (ref 22–32)
Calcium: 8.5 mg/dL — ABNORMAL LOW (ref 8.9–10.3)
Chloride: 99 mmol/L (ref 98–111)
Creatinine, Ser: 1.13 mg/dL — ABNORMAL HIGH (ref 0.44–1.00)
GFR, Estimated: 48 mL/min — ABNORMAL LOW (ref >60)
Glucose, Bld: 119 mg/dL — ABNORMAL HIGH (ref 70–99)
Phosphorus: 3 mg/dL (ref 2.5–4.6)
Potassium: 4.3 mmol/L (ref 3.5–5.1)
Sodium: 130 mmol/L — ABNORMAL LOW (ref 135–145)

## 2020-10-29 LAB — MAGNESIUM: Magnesium: 1.6 mg/dL — ABNORMAL LOW (ref 1.7–2.4)

## 2020-10-29 SURGERY — CANCELLED PROCEDURE

## 2020-10-29 MED ORDER — MAGNESIUM SULFATE 2 GM/50ML IV SOLN
2.0000 g | Freq: Once | INTRAVENOUS | Status: AC
  Administered 2020-10-29: 2 g via INTRAVENOUS
  Filled 2020-10-29: qty 50

## 2020-10-29 MED ORDER — PANTOPRAZOLE SODIUM 40 MG PO TBEC: 40.0000 mg | DELAYED_RELEASE_TABLET | Freq: Every day | ORAL | Status: DC

## 2020-10-29 SURGICAL SUPPLY — 22 items

## 2020-10-29 NOTE — Progress Notes (Signed)
Inpatient Rehabilitation Admissions Coordinator  I met with patient at bedside and spoke with her grandson by phone. Noted possible medical workup completion . I will follow up tomorrow to clarify if patient medically ready to d/c to CIR when bed is available. They are in agreement.  Danne Baxter, RN, MSN Rehab Admissions Coordinator [phone] 10/29/2020 11:47 AM

## 2020-10-29 NOTE — H&P (Signed)
Physical Medicine and Rehabilitation Admission H&P    Chief Complaint  Patient presents with  . Weakness  : HPI: Lisa [name] 86 year old right-handed female with history of breast cancer 1999 treated with lumpectomy and radiation as well as tamoxifen for 5 years, lung cancer with partial upper lobe lobectomy 2006, AKI thought secondary to ARB seen by nephrology services in the past, pancytopenia followed by hematology services in the past, hypothyroidism, quit smoking 29 years ago and gout.  History taken from chart review and patient.  Patient lives with her 18 year old daughter.  Multilevel home 5 steps to entry.  Independent until approximately 3 weeks ago with progressive decrease in mobility.  She presented on 10/21/2020 with generalized weakness, nausea, weight loss, decreased oral intake x3 weeks.  Admission chemistries sodium 122, glucose 121 BUN 30 creatinine 1.53, hemoglobin 10.9, urinalysis negative nitrite, urine culture no growth, uric acid 9.5, troponin 35-41, TSH 14.355, osmolality 276.  She was noted to have new onset atrial fibrillation, cardiology consulted.  Echocardiogram with ejection fraction of 65-70 %, no wall motion abnormalities grade 1 diastolic dysfunction.  She was placed on beta-blocker and monitored.  Lower extremity Doppler negative for DVT.  Gastroenterology services consulted for weight loss and poor appetite.  Underwent upper GI endoscopy on 10/23/2020 per Dr. Havery Moros.  No focal ulcerations noted.  There was a 3 cm hiatal hernia present.  The esophagus was tortuous.  Biopsies were taken with cold forceps in the gastric body.  H. pylori was positive and she was started on triple therapy regimen x2 weeks total and then continue Protonix twice daily.  Orthopedic services consulted 10/26/2020 for bilateral knee joint effusions x-ray showed arthritic changes.  She did undergo joint aspiration of both knees.  Fluid analysis showed no evidence of infection but urate  crystals diagnostic data arthritis.  She was placed on allopurinol but hold on colchicine due to history of AKI.  Her renal function has stabilized with latest creatinine 1.34 and monitored by renal services.  She is tolerating a regular diet.  Due to patient's decrease in functional ability and need for assist with ADLs she was admitted for a comprehensive rehab program.  Please see preadmission assessment from earlier today as well.  Review of Systems  HENT: Negative for hearing loss.   Eyes: Negative for blurred vision and double vision.  Respiratory: Negative for cough and shortness of breath.   Cardiovascular: Negative for chest pain.  Gastrointestinal: Negative for diarrhea and melena.  Genitourinary: Negative for dysuria, flank pain and hematuria.  Musculoskeletal: Positive for joint pain and myalgias.  Skin: Negative for rash.  Neurological: Positive for tremors. Negative for sensory change and weakness.  All other systems reviewed and are negative.  Past Medical History:  Diagnosis Date  . Adenocarcinoma of left lung ([place]) 2006  . Arthritis   . Asthma   . Cancer of breast, female ([place])   . Cancer of lung ([place])   . Hypertension   . Hypothyroidism   . Invasive ductal carcinoma of left breast ([place]) 1999  . Neutropenia ([place]) 06/09/2016  . Personal history of radiation therapy    Past Surgical History:  Procedure Laterality Date  . BIOPSY  10/23/2020   Procedure: BIOPSY;  Surgeon: Yetta Flock, MD;  Location: [facility] ENDOSCOPY;  Service: Gastroenterology;;  . BREAST BIOPSY     left axillary node dissection  . BREAST LUMPECTOMY Left   . COLONOSCOPY  03/08/2003   RJJ:[id number] rectal polyps destroyed with the tip of  the snare/Polyps at hepatic flexure, splenic flexure at 35 cm/Left-sided diverticula: unable to retrieve path  . COLONOSCOPY  09/12/2008   QIH:KVQQVZ rectum and distal sigmoid diminutive polyps/scattered left sided diverticulum. hyperplastic  . COLONOSCOPY N/A  03/06/2013   DGL:OVFIEPP polyp-removed as described above; colonic diverticulosis. hyperplastic polyps. next TCS 03/2018  . ESOPHAGOGASTRODUODENOSCOPY (EGD) WITH PROPOFOL N/A 10/23/2020   Procedure: ESOPHAGOGASTRODUODENOSCOPY (EGD) WITH PROPOFOL;  Surgeon: Yetta Flock, MD;  Location: [place];  Service: Gastroenterology;  Laterality: N/A;  . FOOT SURGERY    . LUNG REMOVAL, PARTIAL     upper lobe   Family History  Problem Relation Age of Onset  . Cancer Mother   . Cancer Sister   . Colon cancer Neg Hx    Social History:  reports that she quit smoking about 29 years ago. She has a 26.25 pack-year smoking history. She has never used smokeless tobacco. She reports that she does not drink alcohol and does not use drugs. Allergies:  Allergies  Allergen Reactions  . Meloxicam Other (See Comments)    Caused an injury to the kidneys, per nephrologist  . Micardis Hct [Telmisartan-Hctz] Other (See Comments)    Caused an injury to the kidneys, per nephrologist   Medications Prior to Admission  Medication Sig Dispense Refill  . clobetasol ointment (TEMOVATE) 2.95 % Apply 1 application topically as needed (for irritation).    . Iron-FA-B Cmp-C-Biot-Probiotic (FUSION PLUS) CAPS Take 1 capsule by mouth daily.    Marland Kitchen albuterol (PROVENTIL,VENTOLIN) 90 MCG/ACT inhaler Inhale 2 puffs into the lungs every 6 (six) hours as needed for wheezing or shortness of breath.    . ferrous sulfate 325 (65 FE) MG tablet Take 325 mg by mouth daily with breakfast.    . levothyroxine (SYNTHROID, LEVOTHROID) 88 MCG tablet Take 88 mcg by mouth daily before breakfast.    . metoprolol succinate (TOPROL-XL) 50 MG 24 hr tablet Take 50 mg by mouth daily. Take with or immediately following a meal.    . Multiple Vitamin (MULTIVITAMIN WITH MINERALS) TABS tablet Take 1 tablet by mouth daily.    . TRELEGY ELLIPTA 100-62.5-25 MCG/INH AEPB Inhale 1 puff into the lungs daily.       Drug Regimen Review Drug regimen was  reviewed and remains appropriate with no significant issues identified.  Home: Home Living Family/patient expects to be discharged to:: Private residence Living Arrangements:  (pt's 31 year old daughter lives with her) Available Help at Discharge: Family,Available PRN/intermittently (almost 24/7) Type of Home: House Home Access: Stairs to enter [employer] of Steps: 5 Entrance Stairs-Rails: Right,Left,Can reach both Home Layout: Multi-level Alternate Level Stairs-Number of Steps: 14 Bathroom Shower/Tub: Chiropodist: Standard Bathroom Accessibility: Yes Home Equipment: Radio producer - single point,Shower seat  Lives With: Daughter   Functional History: Prior Function Level of Independence: Independent Gait / Transfers Assistance Needed: in the past 3 weeks, pt has required more assist with mobility and getting OOB. Pt with recent weight loss and progressive decline in function after being taken off gout medication due to poor kidney function, ADL's / Homemaking Assistance Needed: Pt requiring increased assist for ADL, mostly LB. Pt still was bathing in bathtub and assisted with transfer. >3 weeks ago, pt was independent and driving.  Functional Status:  Mobility: Bed Mobility Overal bed mobility: Needs Assistance Bed Mobility: Supine to Sit Supine to sit: Min guard,HOB elevated General bed mobility comments: no physical assist needed, min guard for lines Transfers Overall transfer level: Needs assistance Equipment used:  Rolling walker (2 wheeled) Transfer via Lift Equipment: Stedy Transfers: Sit to/from Stand Sit to Stand: Mod assist,+2 safety/equipment,+2 physical assistance General transfer comment: Pt needed a lot of education on safety and use of RW - cues for hand placement. Ambulation/Gait Ambulation/Gait assistance: Min assist Gait Distance (Feet): 18 Feet Assistive device: Rolling walker (2 wheeled),2 person hand held assist Gait  Pattern/deviations: Step-through pattern,Step-to pattern,Decreased stride length,Wide base of support,Trunk flexed General Gait Details: requires frequent instructions for clearing walker from obstacles and maintaining space between she and walker Gait velocity: reduced, variable Gait velocity interpretation: <1.8 ft/sec, indicate of risk for recurrent falls    ADL: ADL Overall ADL's : Needs assistance/impaired Eating/Feeding: Set up,Sitting Grooming: Wash/dry hands,Wash/dry face,Set up,Sitting Grooming Details (indicate cue type and reason): in recliner Upper Body Bathing: Moderate assistance,Bed level Lower Body Bathing: Total assistance,Bed level Upper Body Dressing : Moderate assistance,Bed level Lower Body Dressing: Moderate assistance,Sit to/from stand,+2 for safety/equipment Lower Body Dressing Details (indicate cue type and reason): Pt able to don and doff sock without assist from seated position - will need mod A for articles of clothing requring sit<>stand Toilet Transfer: Moderate assistance,+2 for safety/equipment,BSC,RW Toilet Transfer Details (indicate cue type and reason): pulls up on RW - needs vc for safety and instruction on how to use RW safely mod A +2 for boost Toileting- Clothing Manipulation and Hygiene: Total assistance,+2 for physical assistance,+2 for safety/equipment Toileting - Clothing Manipulation Details (indicate cue type and reason): purewick Functional mobility during ADLs: Minimal assistance,+2 for safety/equipment,Rolling walker,Cueing for sequencing,Cueing for safety General ADL Comments: Improved pain management due to injections in Bil knees yesterday. improved access to LB for ADL from seated position. Needs significant instruction on how to use RW  Cognition: Cognition Overall Cognitive Status: Within Functional Limits for tasks assessed Orientation Level: Oriented X4 Cognition Arousal/Alertness: Awake/alert Behavior During Therapy: WFL for tasks  assessed/performed Overall Cognitive Status: Within Functional Limits for tasks assessed General Comments: Pt with grandson (who is an MD) in room confirming info from pt. Pt following all commands.  Physical Exam: Blood pressure 129/74, pulse 80, temperature 98.5 F (36.9 C), resp. rate 20, height 5\' 7"  (1.702 m), weight 88.8 kg, SpO2 94 %. Physical Exam Vitals reviewed.  Constitutional:      Appearance: She is obese.  HENT:     Head: Normocephalic and atraumatic.     Right Ear: External ear normal.     Left Ear: External ear normal.     Nose: Nose normal.  Eyes:     General:        Right eye: No discharge.        Left eye: No discharge.     Extraocular Movements: Extraocular movements intact.  Cardiovascular:     Rate and Rhythm: Normal rate and regular rhythm.  Pulmonary:     Effort: Pulmonary effort is normal. No respiratory distress.     Breath sounds: No stridor.  Abdominal:     General: Abdomen is flat. There is no distension.  Musculoskeletal:     Cervical back: Normal range of motion and neck supple.     Comments: No edema or tenderness in extremities  Skin:    General: Skin is warm and dry.  Neurological:     Mental Status: She is alert.     Comments: Alert and oriented. Follows commands. HOH Bilateral upper extremities: 5/5 proximal distal bilateral lower extremities: Hip flexion: 4-4+/5, distally 5/5  Psychiatric:        Mood and Affect:  Mood normal.        Behavior: Behavior normal.     Results for orders placed or performed during the hospital encounter of 10/21/20 (from the past 48 hour(s))  Renal function panel     Status: Abnormal   Collection Time: 10/29/20  3:12 AM  Result Value Ref Range   Sodium 130 (L) 135 - 145 mmol/L   Potassium 4.3 3.5 - 5.1 mmol/L   Chloride 99 98 - 111 mmol/L   CO2 22 22 - 32 mmol/L   Glucose, Bld 119 (H) 70 - 99 mg/dL    Comment: Glucose reference range applies only to samples taken after fasting for at least 8 hours.    BUN 23 8 - 23 mg/dL   Creatinine, Ser 1.13 (H) 0.44 - 1.00 mg/dL   Calcium 8.5 (L) 8.9 - 10.3 mg/dL   Phosphorus 3.0 2.5 - 4.6 mg/dL   Albumin 2.5 (L) 3.5 - 5.0 g/dL   GFR, Estimated 48 (L) >60 mL/min    Comment: (NOTE) Calculated using the CKD-EPI Creatinine Equation (2021)    Anion gap 9 5 - 15    Comment: Performed at [facility] [address] [place], [place] [address]  CBC with Differential/Platelet     Status: Abnormal   Collection Time: 10/29/20  3:12 AM  Result Value Ref Range   WBC 4.1 4.0 - 10.5 K/uL    Comment: WHITE COUNT CONFIRMED ON SMEAR   RBC 2.92 (L) 3.87 - 5.11 MIL/uL   Hemoglobin 8.4 (L) 12.0 - 15.0 g/dL   HCT 27.8 (L) 36.0 - 46.0 %   MCV 95.2 80.0 - 100.0 fL   MCH 28.8 26.0 - 34.0 pg   MCHC 30.2 30.0 - 36.0 g/dL   RDW 15.6 (H) 11.5 - 15.5 %   Platelets 162 150 - 400 K/uL   nRBC 1.2 (H) 0.0 - 0.2 %   Neutrophils Relative % 48 %   Neutro Abs 1.9 1.7 - 7.7 K/uL   Lymphocytes Relative 18 %   Lymphs Abs 0.7 0.7 - 4.0 K/uL   Monocytes Relative 20 %   Monocytes Absolute 0.8 0.1 - 1.0 K/uL   Eosinophils Relative 0 %   Eosinophils Absolute 0.0 0.0 - 0.5 K/uL   Basophils Relative 0 %   Basophils Absolute 0.0 0.0 - 0.1 K/uL   Immature Granulocytes 14 %   Abs Immature Granulocytes 0.57 (H) 0.00 - 0.07 K/uL    Comment: Performed at [facility], [place] [address], [place], [place] [address]  Parathyroid hormone, intact (no Ca)     Status: None   Collection Time: 10/29/20  3:12 AM  Result Value Ref Range   PTH 50 15 - 65 pg/mL    Comment: (NOTE) Performed At: [facility] [address] [place], [place] [id number] Rush Farmer MD LG:[id number]   VITAMIN D 25 Hydroxy (Vit-D Deficiency, Fractures)     Status: None   Collection Time: 10/29/20  3:12 AM  Result Value Ref Range   Vit D, 25-Hydroxy 46.9 30 - 100 ng/mL    Comment: Performed at [facility] (NOTE) Vitamin D deficiency has been defined by the [place] practice guideline as a level of serum 25-OH  vitamin D less than 20 ng/mL (1,2). The Endocrine Society went on to  further define vitamin D insufficiency as a level between 21 and 29  ng/mL (2).  1. IOM (Institute of Medicine). 2010. Dietary reference  intakes for  calcium and D. [place]: The [employer]. 2. Holick MF, [place], Bischoff-Ferrari HA, et al. Evaluation,  treatment, and prevention of vitamin D deficiency: an Endocrine  Society clinical practice guideline, JCEM. 2011 Jul; 96(7): 1911-30.  Performed at [facility], [place] [address], [place], [place] [address]   Magnesium     Status: Abnormal   Collection Time: 10/29/20  3:12 AM  Result Value Ref Range   Magnesium 1.6 (L) 1.7 - 2.4 mg/dL    Comment: Performed at [facility] [address], [place], [place] [address]  Renal function panel     Status: Abnormal   Collection Time: 10/30/20  3:03 AM  Result Value Ref Range   Sodium 132 (L) 135 - 145 mmol/L   Potassium 4.1 3.5 - 5.1 mmol/L   Chloride 99 98 - 111 mmol/L   CO2 23 22 - 32 mmol/L   Glucose, Bld 110 (H) 70 - 99 mg/dL    Comment: Glucose reference range applies only to samples taken after fasting for at least 8 hours.   BUN 22 8 - 23 mg/dL   Creatinine, Ser 1.34 (H) 0.44 - 1.00 mg/dL   Calcium 8.9 8.9 - 10.3 mg/dL   Phosphorus 4.0 2.5 - 4.6 mg/dL   Albumin 2.7 (L) 3.5 - 5.0 g/dL   GFR, Estimated 39 (L) >60 mL/min    Comment: (NOTE) Calculated using the CKD-EPI Creatinine Equation (2021)    Anion gap 10 5 - 15    Comment: Performed at [facility] [address], [place], [place] [address]  Magnesium     Status: Abnormal   Collection Time: 10/30/20  3:03 AM  Result Value Ref Range   Magnesium 1.6 (L) 1.7 - 2.4 mg/dL    Comment: Performed at [facility] [address] [place],  [address]  Magnesium     Status: None   Collection Time: 10/30/20 11:05 AM  Result Value Ref Range    Magnesium 2.1 1.7 - 2.4 mg/dL    Comment: Performed at [facility] [address] [place],  [address]   No results found.     Medical Problem List and Plan: 1.  Debility secondary to AKI superimposed on CKD stage III/hyponatremia/gout/atrial fibrillation  -patient may shower  -ELOS/Goals: 10-14 days/supervision/mod I  Admit to CIR 2.  Antithrombotics: -DVT/anticoagulation: SCDs  -antiplatelet therapy: N/A 3. Pain Management: Tylenol as needed 4. Mood: Provide emotional support  -antipsychotic agents: N/A 5. Neuropsych: This patient is capable of making decisions on her own behalf. 6. Skin/Wound Care: Routine skin checks 7. Fluids/Electrolytes/Nutrition: Routine in and outs  CMP ordered for tomorrow. 8.  Acute new onset atrial fibrillation: Monitor with increased exertion. 9.  H. pylori positive.  Completed triple regimen therapy and then maintain Protonix twice daily 10.  Bilateral lower extremity knee pain.  Arthrocentesis of both knees.  Fluid analysis no evidence of infection but urate crystals diagnostic gouty arthritis.  Continue allopurinol.  No colchicine due to AKI  Monitor with increased activity 11.  Hypothyroidism: Synthroid 12.  Decreased nutritional storage.  Dietary follow-up 13.  AKI superimposed on CKD stage III.    CMP ordered for tomorrow a.m. 14.  History of breast cancer in 1999 treated with lumpectomy radiation as well as tamoxifen.  Follow-up outpatient 15.  History of lung cancer.  Status post partial upper lobe lobectomy 2006.  Follow-up outpatient 16.  Anemia of chronic disease/pancytopenia.  Patient has been seen by hematology services outpatient.  CBC ordered for tomorrow a.m.  Lavon Paganini Angiulli, PA-C 10/30/2020  I have personally performed a face to face diagnostic evaluation, including, but not limited to relevant history and physical exam findings, of this patient and developed relevant assessment and plan.  Additionally, I have  reviewed and concur with the physician assistant's documentation above.  Delice Lesch, MD, ABPMR

## 2020-10-29 NOTE — Progress Notes (Signed)
PROGRESS NOTE  Lisa [name]  DOB: [birth year]  PCP: Celene Squibb, MD GYK:[id number]  DOA: 10/21/2020  LOS: 8 days   Chief Complaint  Patient presents with  . Weakness   Brief narrative: Patient is an 86 year old with PMH significant for breast cancer, lung cancer, hypothyroidism, gout who presented to the ED with progressive weakness, worse on the left lower extremity. She also has left lower extremity pain especially in her knee and ankle.  Patient has 78-monthhistory of poor appetite and poor oral intake. She has had recent evaluation by hematology for pancytopenia.  Also was seen by nephrology in the office for AKI thought to be secondary to ARB, hydrochlorothiazide and meloxicam, ATN. Evaluation in the ED patient was found to have AKI with a creatinine of 1.5, hyponatremia sodium down to 122.  New diagnosis of A. fib RVR.  Patient was admitted to hospitalist service for further evaluation and management. Consultation services obtained with cardiology, GI, nephrology and orthopedics.  Subjective: Patient was seen and examined this morning. Propped up in bed.  Not in distress.  Feels good.  Had a bowel movement yesterday.  Was able to ambulate after the door yesterday. Labs from this morning noted, sodium gradually up to 130, creatinine better, hemoglobin stable. Updated decision as of this morning - colonoscopy as an outpatient  Assessment/Plan: AKI superimposed on CKD 3b:  -Creatinine at baseline between 1.1-1.3.  Presented with an elevated creatinine of 1.53. -Related to recent use of NSAID, ARB, HCTZ and hypovolemia.  -Creatinine initially improved with IV fluid but worsened again to peak at 1.74.   -Nephrology consult appreciated. -In order to help renal function recovery, on hold are tramadol, gabapentin and colchicine. -Creatinine level is better at 1.13 this morning. Recent Labs    10/22/20 1859 10/23/20 0058 10/24/20 0224 10/25/20 0325 10/26/20 0240  10/26/20 1350 10/27/20 0151 10/27/20 0933 10/28/20 0118 10/29/20 0312  BUN 24* '20 9 23 ' 33* 35* 36* 31* 28* 23  CREATININE 1.09* 1.01* 0.95 1.35* 1.74* 1.70* 1.62* 1.39* 1.24* 1.13*   Acute hyponatremia:  -Likely a combination of SIADH and low solute intake. Fortunately, repeat CT scan chest did not show any recurrence of lung cancer. -Sodium level trend as below.  Improving with Lasix 10 mg oral daily.  130 this morning.  Patient did not require Samsca. -Discussed with nephrology Dr. BCarolin Sicks  Patient will continue Lasix 10 mg daily at discharge and follow-up with PCP as an outpatient for labs.  May need nephrology referral as an outpatient if this is a persistent problem. Recent Labs  Lab 10/22/20 1859 10/23/20 0058 10/24/20 0224 10/25/20 0325 10/26/20 0240 10/26/20 1350 10/27/20 0151 10/27/20 0933 10/28/20 0118 10/29/20 0312  NA 124* 125* 129* 121* 118* 119* 119* 124* 128* 130*   H. pylori positive -Underwent EGD on 12/21 -No evidence of bleeding but biopsy positive for H. Pylori. -12/24, started on triple therapy regimen along with Protonix 40 mg twice daily as recommended by GI.  After the 2 weeks course, patient will continue Protonix 40 mg daily.  Acute on chronic anemia -Hemoglobin at baseline more than 10.  Since admission, she has had steady drop in hemoglobin, to the lowest of 7.9.  No active bleeding.  Brown stool.  Hemoglobin stable at 8.4 today. -12/22, EGD negative.  Diverticulosis noted in CT scan of abdomen.  GI follow-up appreciated.  Colonoscopy as an outpatient. Recent Labs    12/19/19 1051 02/26/20 1049 07/09/20 1050 10/21/20 1123 10/22/20 0820 10/23/20 0058 10/25/20  2025 10/25/20 1059 10/26/20 0240 10/27/20 0151 10/28/20 0118 10/29/20 0312  HGB 11.8* 11.3* 10.9*   < >  --    < > 8.1*  --  7.9* 7.9* 8.4* 8.4*  MCV 104.2* 106.1* 105.3*   < >  --    < > 94.9  --  94.4 94.0 95.9 95.2  VITAMINB12 382 323 268  --  545  --   --   --   --   --   --    --   FOLATE 16.8  --  16.7  --   --   --   --   --   --   --   --   --   FERRITIN 188 200 368*  --   --   --   --  939*  --   --   --   --   TIBC 352 369 352  --   --   --   --   --   --   --   --   --   IRON 53 53 54  --   --   --   --   --   --   --   --   --    < > = values in this interval not displayed.   Bilateral lower extremity/knee pain -Multifactorial: Osteoarthritis, peripheral neuropathy, gout, deconditioning.  Lower extremities DVT scan negative -Elevated uric acid level on admission.  Started on colchicine and allopurinol.  -12/25, orthopedic Dr. Ninfa Linden did arthrocentesis of both knees.  Fluid analysis showed no evidence of infection but urate crystals diagnostic gouty arthritis.  -12/26, intra-articular injection of steroids given. -Pain control with Tylenol.  On hold are tramadol, gabapentin, colchicine because of AKI.  Since creatinine is improving, can gradually resume if needed.  A fib RVR, mild elevation troponin:  -Continue with metoprolol. -CHADSVASC score 4.  Anticoagulation on hold because of AKI and low hemoglobin.  No active bleeding. -Continue monitoring in telemetry.  Elevated inflammatory markers -Present with failure to thrive, anorexia, 20 pound weight loss.   -12/23, CT scan of chest abdomen and pelvis was negative for any recurrence of malignancy. -Patient has elevated ferritins, CRP, ESR level.  Probably related to flareup of gouty arthritis.  Recheck as an outpatient.  Hypomagnesemia/hypophosphatemia -Magnesium level low at 1.6 today.  IV replacement given.   -Phosphorus level was low at 1.3 yesterday.  Improved with replacement.  PTH and vitamin D level sent by nephrology.  In process. Recent Labs  Lab 10/24/20 0224 10/25/20 0325 10/26/20 0240 10/26/20 1350 10/27/20 0151 10/27/20 0933 10/28/20 0118 10/29/20 0312  K 4.1 3.9 4.2 4.6 4.9 4.9 4.8 4.3  MG 1.4* 1.7 1.9  --  1.8  --   --  1.6*  PHOS  --   --   --   --  2.0*  --  1.3* 3.0    Thrombocytopenia:  -Improving Recent Labs  Lab 10/23/20 0058 10/24/20 0224 10/25/20 0325 10/26/20 0240 10/27/20 0151 10/28/20 0118 10/29/20 0312  PLT 67* 85* 101* 119* 127* 149* 162   Hypothyroidism:  Continue with Synthroid.   Moderate malnutrition -Related to acute illness.  Nutrition consult appreciated.  Nutrition supplement ordered.  Resting tremors of hands -outpatient neurology evaluation.  Referral will be given at discharge  Mobility: PT eval appreciated.  CIR recommended. Code Status:   Code Status: Full Code  Nutritional status: Body mass index is 30.66 kg/m. Nutrition Problem: Moderate Malnutrition  Etiology: acute illness Signs/Symptoms: mild muscle depletion,percent weight loss Diet Order            Diet regular Room service appropriate? Yes; Fluid consistency: Thin  Diet effective now                 DVT prophylaxis: Place and maintain sequential compression device Start: 10/26/20 1048 Place and maintain sequential compression device Start: 10/22/20 1202   Antimicrobials:  None Fluid: No IV fluid Consultants: None Family Communication:  Discussed with grandson at bedside.  Status is: Inpatient  Remains inpatient appropriate because: ongoing workup  Dispo: The patient is from: Home              Anticipated d/c is to: Rehab              Anticipated d/c date is: Medically stable for discharge to CIR when bed available.   Infusions:    Scheduled Meds: . acetaminophen  650 mg Oral Q6H  . allopurinol  200 mg Oral Daily  . amoxicillin  1,000 mg Oral Q12H  . clarithromycin  500 mg Oral Q12H  . feeding supplement  237 mL Oral BID BM  . fluticasone furoate-vilanterol  1 puff Inhalation Daily   And  . umeclidinium bromide  1 puff Inhalation Daily  . furosemide  10 mg Oral Daily  . levothyroxine  100 mcg Oral Q0600  . metoCLOPramide (REGLAN) injection  5 mg Intravenous Once  . metoprolol tartrate  12.5 mg Oral BID  . metroNIDAZOLE  500 mg  Oral Q12H  . multivitamin with minerals  1 tablet Oral Daily  . nystatin  5 mL Oral TID AC & HS  . pantoprazole  40 mg Oral BID  . [START ON 11/06/2020] pantoprazole  40 mg Oral Daily  . peg 3350 powder  0.5 kit Oral Once  . potassium & sodium phosphates  1 packet Oral Q0600  . senna-docusate  1 tablet Oral QHS  . sodium chloride flush  3 mL Intravenous Q12H    Antimicrobials: Anti-infectives (From admission, onward)   Start     Dose/Rate Route Frequency Ordered Stop   10/27/20 1145  clarithromycin (BIAXIN) tablet 500 mg        500 mg Oral Every 12 hours 10/27/20 1057 11/06/20 0959   10/25/20 1100  clarithromycin (BIAXIN) tablet 500 mg  Status:  Discontinued        500 mg Oral Every 12 hours 10/25/20 1011 10/27/20 0922   10/25/20 1100  amoxicillin (AMOXIL) capsule 1,000 mg        1,000 mg Oral Every 12 hours 10/25/20 1011 11/04/20 0959   10/25/20 1100  metroNIDAZOLE (FLAGYL) tablet 500 mg        500 mg Oral Every 12 hours 10/25/20 1011 11/04/20 0959      PRN meds: albuterol, ondansetron **OR** ondansetron (ZOFRAN) IV, polyethylene glycol   Objective: Vitals:   10/29/20 0501 10/29/20 0744  BP: 132/63   Pulse: 80   Resp: 18   Temp: 97.9 F (36.6 C)   SpO2: 96% 98%    Intake/Output Summary (Last 24 hours) at 10/29/2020 1032 Last data filed at 10/29/2020 0339 Gross per 24 hour  Intake 240 ml  Output 1500 ml  Net -1260 ml   Filed Weights   10/22/20 1540 10/23/20 0705 10/25/20 0524  Weight: 89.4 kg 89.4 kg 88.8 kg   Weight change:  Body mass index is 30.66 kg/m.   Physical Exam: General exam: Pleasant, elderly African-American female. Not  in distress.  No new symptoms Skin: No rashes, lesions or ulcers. HEENT: Atraumatic, normocephalic, no obvious bleeding Lungs: Clear to auscultation bilaterally CVS: Regular rate and rhythm, no murmur GI/Abd soft, nontender, nondistended, bowel sound present CNS: Alert, awake, oriented x3 Psychiatry: Mood  appropriate Extremities: No pedal edema, no calf tenderness.  Both knees feel better  Data Review: I have personally reviewed the laboratory data and studies available.  Recent Labs  Lab 10/25/20 0325 10/26/20 0240 10/27/20 0151 10/28/20 0118 10/29/20 0312  WBC 9.5 6.5 4.1 2.0* 4.1  NEUTROABS 1.5* 1.6* 0.7* 0.9* 1.9  HGB 8.1* 7.9* 7.9* 8.4* 8.4*  HCT 25.9* 25.1* 25.1* 28.3* 27.8*  MCV 94.9 94.4 94.0 95.9 95.2  PLT 101* 119* 127* 149* 162   Recent Labs  Lab 10/24/20 0224 10/25/20 0325 10/26/20 0240 10/26/20 1350 10/27/20 0151 10/27/20 0933 10/28/20 0118 10/29/20 0312  NA 129* 121* 118* 119* 119* 124* 128* 130*  K 4.1 3.9 4.2 4.6 4.9 4.9 4.8 4.3  CL 95* 90* 89* 90* 89* 94* 97* 99  CO2 '26 22 22 22 ' 21* 21* 23 22  GLUCOSE 97 99 119* 132* 89 87 135* 119*  BUN 9 23 33* 35* 36* 31* 28* 23  CREATININE 0.95 1.35* 1.74* 1.70* 1.62* 1.39* 1.24* 1.13*  CALCIUM 8.6* 8.1* 7.7* 7.6* 7.5* 7.9* 8.4* 8.5*  MG 1.4* 1.7 1.9  --  1.8  --   --  1.6*  PHOS  --   --   --   --  2.0*  --  1.3* 3.0   F/u labs ordered.  Signed, Terrilee Croak, MD [facility] 10/29/2020

## 2020-10-29 NOTE — Plan of Care (Signed)

## 2020-10-29 NOTE — Progress Notes (Signed)
Hgb stable at 8.4.    Patient and family do not want patient to pursue inpatient colonoscopy.  GI signing off.  Can follow-up with GI at discharge. Continue a total of 14 days of therapy for eradication of H. pylori which includes PPI bid, metronidazole, amoxicillin, Biaxin at current doses After completing H. pylori treatment, decrease Protonix to 40 mg once daily on 1/5.  Scheduled Meds: . acetaminophen  650 mg Oral Q6H  . allopurinol  200 mg Oral Daily  . amoxicillin  1,000 mg Oral Q12H  . clarithromycin  500 mg Oral Q12H  . feeding supplement  237 mL Oral BID BM  . fluticasone furoate-vilanterol  1 puff Inhalation Daily   And  . umeclidinium bromide  1 puff Inhalation Daily  . furosemide  10 mg Oral Daily  . levothyroxine  100 mcg Oral Q0600  . metoCLOPramide (REGLAN) injection  5 mg Intravenous Once  . metoprolol tartrate  12.5 mg Oral BID  . metroNIDAZOLE  500 mg Oral Q12H  . multivitamin with minerals  1 tablet Oral Daily  . nystatin  5 mL Oral TID AC & HS  . pantoprazole  40 mg Oral BID  . [START ON 11/05/2020] pantoprazole  40 mg Oral Daily  . peg 3350 powder  0.5 kit Oral Once  . potassium & sodium phosphates  1 packet Oral Q0600  . senna-docusate  1 tablet Oral QHS  . sodium chloride flush  3 mL Intravenous Q12H   Continuous Infusions: PRN Meds:.albuterol, ondansetron **OR** ondansetron (ZOFRAN) IV, polyethylene glycol   Family and patient can decide whether or not she wants to pursue colonoscopy as an outpatient.  Azucena Freed PA-C

## 2020-10-29 NOTE — Progress Notes (Addendum)
[facility] NEPHROLOGY PROGRESS NOTE  Assessment/ Plan:  #Nonoliguric AKI: Likely hemodynamically mediated in the setting of ARB, HCTZ, Mobic, IV contrast on 12/23 and urinary retention.  Nonoliguric, creatinine level improved to 1.13.  She has renal recovery.  I recommend to follow with PCP and have repeat renal panel done in a week. Avoid nephrotoxins or hypotensive episode.  #Hyponatremia: Probably combination of both hypovolemic hyponatremia concomitant with some component of SIADH.  Serum sodium level continue to improve to 130.  Agree with low-dose Lasix.  She did not require Samsca.  Clinically asymptomatic.  #Acute on chronic anemia: Seen by GI, planning colonoscopy inpatient.  Do not think it is caused by renal failure.  Hemoglobin is stable.  #Atrial fibrillation: On metoprolol.  #Hypophosphatemia: Phosphorus level improved after repletion, encourage oral intake.Marland Kitchen  Please follow-up vitamin D and PTH level.    Lab improving, nothing further to add.  Sign off, please call back with question.  Subjective: Seen and examined at bed side.  Urine output recorded 1.5 L in 24 hours.  She denies nausea, vomiting, chest pain, shortness of breath.  No new event.  She reports feeling good.  Objective Vital signs in last 24 hours: Vitals:   10/28/20 1420 10/28/20 2142 10/29/20 0501 10/29/20 0744  BP: 134/63 (!) 150/71 132/63   Pulse: 76 83 80   Resp: '19 20 18   ' Temp: 98.9 F (37.2 C) 98.3 F (36.8 C) 97.9 F (36.6 C)   TempSrc:  Oral Oral   SpO2: 93% 96% 96% 98%  Weight:      Height:       Weight change:   Intake/Output Summary (Last 24 hours) at 10/29/2020 0810 Last data filed at 10/29/2020 0347 Gross per 24 hour  Intake 240 ml  Output 1500 ml  Net -1260 ml       Labs: Basic Metabolic Panel: Recent Labs  Lab 10/27/20 0151 10/27/20 0933 10/28/20 0118 10/29/20 0312  NA 119* 124* 128* 130*  K 4.9 4.9 4.8 4.3  CL 89* 94* 97* 99  CO2 21* 21* 23 22   GLUCOSE 89 87 135* 119*  BUN 36* 31* 28* 23  CREATININE 1.62* 1.39* 1.24* 1.13*  CALCIUM 7.5* 7.9* 8.4* 8.5*  PHOS 2.0*  --  1.3* 3.0   Liver Function Tests: Recent Labs  Lab 10/27/20 0933 10/28/20 0118 10/29/20 0312  AST 23  --   --   ALT 16  --   --   ALKPHOS 44  --   --   BILITOT 0.7  --   --   PROT 6.2*  --   --   ALBUMIN 2.2* 2.2* 2.5*   No results for input(s): LIPASE, AMYLASE in the last 168 hours. No results for input(s): AMMONIA in the last 168 hours. CBC: Recent Labs  Lab 10/25/20 0325 10/26/20 0240 10/27/20 0151 10/28/20 0118 10/29/20 0312  WBC 9.5 6.5 4.1 2.0* 4.1  NEUTROABS 1.5* 1.6* 0.7* 0.9* 1.9  HGB 8.1* 7.9* 7.9* 8.4* 8.4*  HCT 25.9* 25.1* 25.1* 28.3* 27.8*  MCV 94.9 94.4 94.0 95.9 95.2  PLT 101* 119* 127* 149* 162   Cardiac Enzymes: No results for input(s): CKTOTAL, CKMB, CKMBINDEX, TROPONINI in the last 168 hours. CBG: No results for input(s): GLUCAP in the last 168 hours.  Iron Studies:  No results for input(s): IRON, TIBC, TRANSFERRIN, FERRITIN in the last 72 hours. Studies/Results: No results found.  Medications: Infusions: . magnesium sulfate bolus IVPB      Scheduled  Medications: . acetaminophen  650 mg Oral Q6H  . allopurinol  200 mg Oral Daily  . amoxicillin  1,000 mg Oral Q12H  . clarithromycin  500 mg Oral Q12H  . feeding supplement  237 mL Oral BID BM  . fluticasone furoate-vilanterol  1 puff Inhalation Daily   And  . umeclidinium bromide  1 puff Inhalation Daily  . furosemide  10 mg Oral Daily  . levothyroxine  100 mcg Oral Q0600  . metoCLOPramide (REGLAN) injection  5 mg Intravenous Once  . metoprolol tartrate  12.5 mg Oral BID  . metroNIDAZOLE  500 mg Oral Q12H  . multivitamin with minerals  1 tablet Oral Daily  . nystatin  5 mL Oral TID AC & HS  . pantoprazole  40 mg Oral BID  . [START ON 11/05/2020] pantoprazole  40 mg Oral Daily  . peg 3350 powder  0.5 kit Oral Once  . potassium & sodium phosphates  1 packet Oral  Q0600  . senna-docusate  1 tablet Oral QHS  . sodium chloride flush  3 mL Intravenous Q12H    have reviewed scheduled and prn medications.  Physical Exam: General: Sitting on bed comfortable, not in distress Heart:RRR, s1s2 nl Lungs: Clear bilateral, no crackle Abdomen:soft, Non-tender, non-distended Extremities: No LE edema Neurology: Alert awake and following commands  Braidon Chermak Prasad Madysin Crisp 10/29/2020,8:10 AM  LOS: 8 days

## 2020-10-29 NOTE — Progress Notes (Signed)
  Brief GI note:   Patient was scheduled to have colonoscopy done today.  She drank only a little bit of preparation and then stopped.  She would like to get it done as outpt.  No active bleeding. Hb has been stable.  Plan: -Advance diet. -Grandson would like patient to FU with Dr. Havery Moros as an outpt for consideration of colonoscopy (if needed) -She will continue to follow-up with hematology. -Will sign off for now. -Discussed with hospitalist svc   Carmell Austria, MD [place] [phone]

## 2020-10-30 ENCOUNTER — Inpatient Hospital Stay (HOSPITAL_COMMUNITY)
Admission: RE | Admit: 2020-10-30 | Discharge: 2020-11-13 | DRG: 945 | Disposition: A | Discharge status: Home Health | Payer: Medicare Other | Source: Intra-hospital | Attending: Physical Medicine and Rehabilitation | Admitting: Physical Medicine and Rehabilitation

## 2020-10-30 ENCOUNTER — Other Ambulatory Visit: Payer: Self-pay

## 2020-10-30 ENCOUNTER — Other Ambulatory Visit (HOSPITAL_COMMUNITY): Payer: Medicare Other

## 2020-10-30 ENCOUNTER — Encounter (HOSPITAL_COMMUNITY): Payer: Self-pay | Admitting: Physical Medicine and Rehabilitation

## 2020-10-30 DIAGNOSIS — H9190 Unspecified hearing loss, unspecified ear: Secondary | ICD-10-CM | POA: Diagnosis present

## 2020-10-30 DIAGNOSIS — E669 Obesity, unspecified: Secondary | ICD-10-CM | POA: Diagnosis present

## 2020-10-30 DIAGNOSIS — R5381 Other malaise: Principal | ICD-10-CM | POA: Diagnosis present

## 2020-10-30 DIAGNOSIS — M109 Gout, unspecified: Secondary | ICD-10-CM | POA: Diagnosis present

## 2020-10-30 DIAGNOSIS — E039 Hypothyroidism, unspecified: Secondary | ICD-10-CM | POA: Diagnosis present

## 2020-10-30 DIAGNOSIS — D696 Thrombocytopenia, unspecified: Secondary | ICD-10-CM | POA: Diagnosis not present

## 2020-10-30 DIAGNOSIS — Z79899 Other long term (current) drug therapy: Secondary | ICD-10-CM

## 2020-10-30 DIAGNOSIS — D61818 Other pancytopenia: Secondary | ICD-10-CM | POA: Diagnosis not present

## 2020-10-30 DIAGNOSIS — N1831 Chronic kidney disease, stage 3a: Secondary | ICD-10-CM | POA: Diagnosis present

## 2020-10-30 DIAGNOSIS — Z923 Personal history of irradiation: Secondary | ICD-10-CM

## 2020-10-30 DIAGNOSIS — Z87891 Personal history of nicotine dependence: Secondary | ICD-10-CM

## 2020-10-30 DIAGNOSIS — I129 Hypertensive chronic kidney disease with stage 1 through stage 4 chronic kidney disease, or unspecified chronic kidney disease: Secondary | ICD-10-CM | POA: Diagnosis present

## 2020-10-30 DIAGNOSIS — Z79890 Hormone replacement therapy (postmenopausal): Secondary | ICD-10-CM

## 2020-10-30 DIAGNOSIS — D638 Anemia in other chronic diseases classified elsewhere: Secondary | ICD-10-CM

## 2020-10-30 DIAGNOSIS — E871 Hypo-osmolality and hyponatremia: Secondary | ICD-10-CM

## 2020-10-30 DIAGNOSIS — Z85118 Personal history of other malignant neoplasm of bronchus and lung: Secondary | ICD-10-CM

## 2020-10-30 DIAGNOSIS — M25462 Effusion, left knee: Secondary | ICD-10-CM | POA: Diagnosis present

## 2020-10-30 DIAGNOSIS — D631 Anemia in chronic kidney disease: Secondary | ICD-10-CM | POA: Diagnosis present

## 2020-10-30 DIAGNOSIS — B9681 Helicobacter pylori [H. pylori] as the cause of diseases classified elsewhere: Secondary | ICD-10-CM | POA: Diagnosis present

## 2020-10-30 DIAGNOSIS — K449 Diaphragmatic hernia without obstruction or gangrene: Secondary | ICD-10-CM | POA: Diagnosis present

## 2020-10-30 DIAGNOSIS — Z6832 Body mass index (BMI) 32.0-32.9, adult: Secondary | ICD-10-CM

## 2020-10-30 DIAGNOSIS — R0989 Other specified symptoms and signs involving the circulatory and respiratory systems: Secondary | ICD-10-CM

## 2020-10-30 DIAGNOSIS — I4891 Unspecified atrial fibrillation: Secondary | ICD-10-CM | POA: Diagnosis present

## 2020-10-30 DIAGNOSIS — R400 Somnolence: Secondary | ICD-10-CM | POA: Diagnosis not present

## 2020-10-30 DIAGNOSIS — Z853 Personal history of malignant neoplasm of breast: Secondary | ICD-10-CM

## 2020-10-30 DIAGNOSIS — M25461 Effusion, right knee: Secondary | ICD-10-CM | POA: Diagnosis present

## 2020-10-30 DIAGNOSIS — N179 Acute kidney failure, unspecified: Secondary | ICD-10-CM | POA: Diagnosis not present

## 2020-10-30 DIAGNOSIS — I480 Paroxysmal atrial fibrillation: Secondary | ICD-10-CM

## 2020-10-30 DIAGNOSIS — G4700 Insomnia, unspecified: Secondary | ICD-10-CM | POA: Diagnosis not present

## 2020-10-30 DIAGNOSIS — N1832 Chronic kidney disease, stage 3b: Secondary | ICD-10-CM | POA: Diagnosis not present

## 2020-10-30 DIAGNOSIS — D72822 Plasmacytosis: Secondary | ICD-10-CM | POA: Diagnosis not present

## 2020-10-30 LAB — RENAL FUNCTION PANEL
Albumin: 2.7 g/dL — ABNORMAL LOW (ref 3.5–5.0)
Anion gap: 10 (ref 5–15)
BUN: 22 mg/dL (ref 8–23)
CO2: 23 mmol/L (ref 22–32)
Calcium: 8.9 mg/dL (ref 8.9–10.3)
Chloride: 99 mmol/L (ref 98–111)
Creatinine, Ser: 1.34 mg/dL — ABNORMAL HIGH (ref 0.44–1.00)
GFR, Estimated: 39 mL/min — ABNORMAL LOW (ref >60)
Glucose, Bld: 110 mg/dL — ABNORMAL HIGH (ref 70–99)
Phosphorus: 4 mg/dL (ref 2.5–4.6)
Potassium: 4.1 mmol/L (ref 3.5–5.1)
Sodium: 132 mmol/L — ABNORMAL LOW (ref 135–145)

## 2020-10-30 LAB — MAGNESIUM
Magnesium: 1.6 mg/dL — ABNORMAL LOW (ref 1.7–2.4)
Magnesium: 2.1 mg/dL (ref 1.7–2.4)

## 2020-10-30 LAB — PARATHYROID HORMONE, INTACT (NO CA): PTH: 50 pg/mL (ref 15–65)

## 2020-10-30 LAB — VITAMIN D 25 HYDROXY (VIT D DEFICIENCY, FRACTURES): Vit D, 25-Hydroxy: 46.9 ng/mL (ref 30–100)

## 2020-10-30 MED ORDER — METOPROLOL TARTRATE 25 MG PO TABS: 12.5000 mg | ORAL_TABLET | Freq: Two times a day (BID) | ORAL | 0 refills | Status: DC

## 2020-10-30 MED ORDER — CLARITHROMYCIN 500 MG PO TABS: 500.0000 mg | ORAL_TABLET | Freq: Two times a day (BID) | ORAL | 0 refills | Status: DC

## 2020-10-30 MED ORDER — ONDANSETRON HCL 4 MG PO TABS
4.0000 mg | ORAL_TABLET | Freq: Four times a day (QID) | ORAL | Status: DC | PRN
Start: 2020-10-30 — End: 2020-11-13

## 2020-10-30 MED ORDER — SENNOSIDES-DOCUSATE SODIUM 8.6-50 MG PO TABS
1.0000 | ORAL_TABLET | Freq: Every day | ORAL | Status: DC
  Administered 2020-10-30 – 2020-11-12 (×14): 1 via ORAL
  Filled 2020-10-30 (×14): qty 1

## 2020-10-30 MED ORDER — PANTOPRAZOLE SODIUM 40 MG PO TBEC: 40.0000 mg | DELAYED_RELEASE_TABLET | Freq: Two times a day (BID) | ORAL | 0 refills | Status: DC

## 2020-10-30 MED ORDER — ALLOPURINOL 100 MG PO TABS: 200.0000 mg | ORAL_TABLET | Freq: Every day | ORAL | 0 refills | Status: DC

## 2020-10-30 MED ORDER — METRONIDAZOLE 500 MG PO TABS: 500.0000 mg | ORAL_TABLET | Freq: Two times a day (BID) | ORAL | 0 refills | Status: DC

## 2020-10-30 MED ORDER — MAGNESIUM SULFATE 4 GM/100ML IV SOLN
4.0000 g | Freq: Once | INTRAVENOUS | Status: AC
  Administered 2020-10-30: 4 g via INTRAVENOUS
  Filled 2020-10-30: qty 100

## 2020-10-30 MED ORDER — ACETAMINOPHEN 325 MG PO TABS
650.0000 mg | ORAL_TABLET | Freq: Four times a day (QID) | ORAL | Status: DC
  Administered 2020-10-30 – 2020-11-13 (×54): 650 mg via ORAL
  Filled 2020-10-30 (×54): qty 2

## 2020-10-30 MED ORDER — POLYETHYLENE GLYCOL 3350 17 G PO PACK: 17.0000 g | PACK | Freq: Every day | ORAL | 0 refills | Status: DC | PRN

## 2020-10-30 MED ORDER — AMOXICILLIN 500 MG PO CAPS: 1000.0000 mg | ORAL_CAPSULE | Freq: Two times a day (BID) | ORAL | 0 refills | Status: DC

## 2020-10-30 MED ORDER — CLARITHROMYCIN 500 MG PO TABS
500.0000 mg | ORAL_TABLET | Freq: Two times a day (BID) | ORAL | Status: AC
  Administered 2020-10-30 – 2020-11-06 (×15): 500 mg via ORAL
  Filled 2020-10-30 (×16): qty 1

## 2020-10-30 MED ORDER — FUROSEMIDE 20 MG PO TABS: 10.0000 mg | ORAL_TABLET | Freq: Every day | ORAL | 0 refills | Status: DC

## 2020-10-30 MED ORDER — FLUTICASONE FUROATE-VILANTEROL 100-25 MCG/INH IN AEPB
1.0000 | INHALATION_SPRAY | Freq: Every day | RESPIRATORY_TRACT | Status: DC
  Administered 2020-10-31 – 2020-11-13 (×14): 1 via RESPIRATORY_TRACT
  Filled 2020-10-30 (×2): qty 28

## 2020-10-30 MED ORDER — ALBUTEROL SULFATE HFA 108 (90 BASE) MCG/ACT IN AERS: 2.0000 | INHALATION_SPRAY | Freq: Four times a day (QID) | RESPIRATORY_TRACT | Status: DC | PRN

## 2020-10-30 MED ORDER — LEVOTHYROXINE SODIUM 100 MCG PO TABS: 100.0000 ug | ORAL_TABLET | Freq: Every day | ORAL | 0 refills | Status: DC

## 2020-10-30 MED ORDER — PANTOPRAZOLE SODIUM 40 MG PO TBEC
40.0000 mg | DELAYED_RELEASE_TABLET | Freq: Two times a day (BID) | ORAL | Status: DC
  Administered 2020-10-30 – 2020-11-04 (×10): 40 mg via ORAL
  Filled 2020-10-30 (×10): qty 1

## 2020-10-30 MED ORDER — METRONIDAZOLE 500 MG PO TABS
500.0000 mg | ORAL_TABLET | Freq: Two times a day (BID) | ORAL | Status: DC
  Administered 2020-10-30 – 2020-10-31 (×2): 500 mg via ORAL
  Filled 2020-10-30 (×3): qty 1

## 2020-10-30 MED ORDER — LEVOTHYROXINE SODIUM 100 MCG PO TABS
100.0000 ug | ORAL_TABLET | Freq: Every day | ORAL | Status: DC
  Administered 2020-10-31 – 2020-11-13 (×14): 100 ug via ORAL
  Filled 2020-10-30 (×15): qty 1

## 2020-10-30 MED ORDER — AMOXICILLIN 250 MG PO CAPS
1000.0000 mg | ORAL_CAPSULE | Freq: Two times a day (BID) | ORAL | Status: DC
  Administered 2020-10-30 – 2020-10-31 (×2): 1000 mg via ORAL
  Filled 2020-10-30 (×2): qty 4

## 2020-10-30 MED ORDER — METOPROLOL TARTRATE 12.5 MG HALF TABLET
12.5000 mg | ORAL_TABLET | Freq: Two times a day (BID) | ORAL | Status: DC
  Administered 2020-10-30 – 2020-11-13 (×23): 12.5 mg via ORAL
  Filled 2020-10-30 (×28): qty 1

## 2020-10-30 MED ORDER — ADULT MULTIVITAMIN W/MINERALS CH
1.0000 | ORAL_TABLET | Freq: Every day | ORAL | Status: DC
  Administered 2020-10-31 – 2020-11-13 (×14): 1 via ORAL
  Filled 2020-10-30 (×14): qty 1

## 2020-10-30 MED ORDER — FUROSEMIDE 20 MG PO TABS
10.0000 mg | ORAL_TABLET | Freq: Every day | ORAL | Status: DC
  Administered 2020-10-31 – 2020-11-13 (×14): 10 mg via ORAL
  Filled 2020-10-30 (×14): qty 1

## 2020-10-30 MED ORDER — ONDANSETRON HCL 4 MG/2ML IJ SOLN
4.0000 mg | Freq: Four times a day (QID) | INTRAMUSCULAR | Status: DC | PRN
Start: 2020-10-30 — End: 2020-11-13

## 2020-10-30 MED ORDER — UMECLIDINIUM BROMIDE 62.5 MCG/INH IN AEPB
1.0000 | INHALATION_SPRAY | Freq: Every day | RESPIRATORY_TRACT | Status: DC
  Administered 2020-10-31 – 2020-11-13 (×14): 1 via RESPIRATORY_TRACT
  Filled 2020-10-30 (×3): qty 7

## 2020-10-30 MED ORDER — NYSTATIN 100000 UNIT/ML MT SUSP
5.0000 mL | Freq: Three times a day (TID) | OROMUCOSAL | Status: DC
  Administered 2020-10-30 – 2020-11-13 (×52): 500000 [IU] via ORAL
  Filled 2020-10-30 (×58): qty 5

## 2020-10-30 MED ORDER — ALLOPURINOL 100 MG PO TABS
200.0000 mg | ORAL_TABLET | Freq: Every day | ORAL | Status: DC
  Administered 2020-10-31 – 2020-11-05 (×6): 200 mg via ORAL
  Filled 2020-10-30 (×6): qty 2

## 2020-10-30 MED ORDER — PANTOPRAZOLE SODIUM 40 MG PO TBEC: 40.0000 mg | DELAYED_RELEASE_TABLET | Freq: Every day | ORAL | 0 refills | Status: DC

## 2020-10-30 MED ORDER — PANTOPRAZOLE SODIUM 40 MG PO TBEC: 40.0000 mg | DELAYED_RELEASE_TABLET | Freq: Every day | ORAL | Status: DC

## 2020-10-30 MED ORDER — POLYETHYLENE GLYCOL 3350 17 G PO PACK: 17.0000 g | PACK | Freq: Every day | ORAL | Status: DC | PRN

## 2020-10-30 MED ORDER — ENSURE ENLIVE PO LIQD
237.0000 mL | Freq: Two times a day (BID) | ORAL | Status: DC
  Administered 2020-10-31 – 2020-11-12 (×22): 237 mL via ORAL
  Filled 2020-10-30 (×2): qty 237

## 2020-10-30 NOTE — Progress Notes (Signed)
Jamse Arn, MD  Physician  Physical Medicine and Rehabilitation  PMR Pre-admission     Signed  Date of Service:  10/24/2020  3:30 PM      Related encounter: ED to Hosp-Admission (Discharged) from 10/21/2020 in [facility]       Signed          Show:Clear all [x] Manual[x] Template[x] Copied  Added by: [x] Cristina Gong, RN[x] Jamse Arn, MD   [] Hover for details  PMR Admission Coordinator Pre-Admission Assessment   Patient: Lisa [name] is an 86 y.o., female MRN: [id number] DOB: Nov 20, 1933 Height: 5\' 7"  (170.2 cm) Weight: 88.8 kg   Insurance Information HMO:     PPO:      PCP:      IPA:      80/20:      OTHER:  PRIMARY: Medicare a and b      Policy#: [id number]      Subscriber: pt Benefits:  Phone #: passport one online     Name: 12/23 Eff. Date: 03/02/2001     Deduct: $1484      Out of Pocket Max: none      Life Max: none CIR: 100%      SNF: 20 full days Outpatient: 80%     Co-Pay: 20% Home Health: 100%      Co-Pay: none DME: 80%     Co-Pay: 20% Providers: pt choice  SECONDARY: Tricare for Life      Policy#: [id number]     Phone#: pt   Financial Counselor:       Phone#:    The "Data Collection Information Summary" for patients in Inpatient Rehabilitation Facilities with attached "Privacy Act [place] Records" was provided and verbally reviewed with: Patient   Emergency Contact Information         Contact Information     Name Relation Home Work [place] Daughter [phone]   [phone]    Caressa, Scearce [phone]             Current Medical History  Patient Admitting Diagnosis: Debility   History of Present Illness:  86 year old right-handed female with history of breast cancer 1999 treated with lumpectomy and radiation as well as tamoxifen for 5 years, lung cancer with partial upper lobe lobectomy 2006, AKI thought secondary to ARB seen by nephrology services in the past,  pancytopenia followed by hematology services in the past, hypothyroidism, quit smoking 29 years ago and gout.   Independent until approximately 3 weeks ago with progressive decrease in mobility.  Presented 10/21/2020 with generalized weakness nausea weight loss decreased oral intake over the past 3 weeks.  Admission chemistries sodium 122 glucose 121 BUN 30 creatinine 1.53, hemoglobin 10.9, urinalysis negative nitrite, urine culture no growth, uric acid 9.5, troponin 35-41, TSH 14.355, osmolality 276.  New onset atrial fibrillation cardiology services consulted.  Echocardiogram with ejection fraction of 65 to 70% no wall motion abnormalities grade 1 diastolic dysfunction.  She was placed on beta-blocker and monitored.  Lower extremity Doppler showed no DVT.  Gastroenterology services consulted for weight loss and poor appetite.  Underwent upper GI endoscopy 10/23/2020 per Dr. Havery Moros.  No focal ulcerations noted.  There was a 3 cm hiatal hernia present.  The esophagus was tortuous.  Biopsies were taken with cold forceps in the gastric body.  H. pylori was positive started on triple therapy regimen x2 weeks total and then continue Protonix twice daily.  Orthopedic services consulted 10/26/2020 for bilateral knee  joint effusions x-ray showed arthritic changes.  She did undergo joint aspiration of both knees.  Fluid analysis showed no evidence of infection but urate crystals diagnostic gouty arthritis.  She was placed on allopurinol but hold on colchicine due to history of AKI.  Her renal function has stabilized with latest creatinine 1.13 and monitored by renal services.  She is tolerating a regular diet.    Patient's medical record from [facility]  has been reviewed by the rehabilitation admission coordinator and physician.   Past Medical History      Past Medical History:  Diagnosis Date  . Adenocarcinoma of left lung (HCC) 2006  . Arthritis    . Asthma    . Cancer of breast, female (HCC)     . Cancer of lung (HCC)    . Hypertension    . Hypothyroidism    . Invasive ductal carcinoma of left breast (HCC) 1999  . Neutropenia (HCC) 06/09/2016  . Personal history of radiation therapy        Family History   family history includes Cancer in her mother and sister.   Prior Rehab/Hospitalizations Has the patient had prior rehab or hospitalizations prior to admission? Yes   Has the patient had major surgery during 100 days prior to admission? No               Current Medications   Current Facility-Administered Medications:  .  acetaminophen (TYLENOL) tablet 650 mg, 650 mg, Oral, Q6H, Dahal, Binaya, MD, 650 mg at 10/30/20 0548 .  albuterol (VENTOLIN HFA) 108 (90 Base) MCG/ACT inhaler 2 puff, 2 puff, Inhalation, Q6H PRN, Lewie Chamber, MD, 2 puff at 10/26/20 1234 .  allopurinol (ZYLOPRIM) tablet 200 mg, 200 mg, Oral, Daily, Annie Sable, MD, 200 mg at 10/30/20 0909 .  amoxicillin (AMOXIL) capsule 1,000 mg, 1,000 mg, Oral, Q12H, Dianah Field, PA-C, 1,000 mg at 10/30/20 7989 .  clarithromycin (BIAXIN) tablet 500 mg, 500 mg, Oral, Q12H, Dianah Field, PA-C, 500 mg at 10/30/20 2119 .  feeding supplement (ENSURE ENLIVE / ENSURE PLUS) liquid 237 mL, 237 mL, Oral, BID BM, Dahal, Binaya, MD, 237 mL at 10/30/20 0910 .  fluticasone furoate-vilanterol (BREO ELLIPTA) 100-25 MCG/INH 1 puff, 1 puff, Inhalation, Daily, 1 puff at 10/27/20 1159 **AND** umeclidinium bromide (INCRUSE ELLIPTA) 62.5 MCG/INH 1 puff, 1 puff, Inhalation, Daily, Lewie Chamber, MD, 1 puff at 10/30/20 0739 .  furosemide (LASIX) tablet 10 mg, 10 mg, Oral, Daily, Dahal, Binaya, MD, 10 mg at 10/30/20 0909 .  levothyroxine (SYNTHROID) tablet 100 mcg, 100 mcg, Oral, Q0600, Annie Sable, MD, 100 mcg at 10/30/20 0548 .  metoprolol tartrate (LOPRESSOR) tablet 12.5 mg, 12.5 mg, Oral, BID, Dahal, Binaya, MD, 12.5 mg at 10/30/20 0908 .  metroNIDAZOLE (FLAGYL) tablet 500 mg, 500 mg, Oral, Q12H, Dianah Field,  PA-C, 500 mg at 10/30/20 0908 .  multivitamin with minerals tablet 1 tablet, 1 tablet, Oral, Daily, Lewie Chamber, MD, 1 tablet at 10/30/20 0909 .  nystatin (MYCOSTATIN) 100000 UNIT/ML suspension 500,000 Units, 5 mL, Oral, TID AC & HS, Regalado, Belkys A, MD, 500,000 Units at 10/30/20 0908 .  ondansetron (ZOFRAN) tablet 4 mg, 4 mg, Oral, Q6H PRN **OR** ondansetron (ZOFRAN) injection 4 mg, 4 mg, Intravenous, Q6H PRN, Lewie Chamber, MD .  pantoprazole (PROTONIX) EC tablet 40 mg, 40 mg, Oral, BID, Dianah Field, PA-C, 40 mg at 10/30/20 0909 .  [START ON 11/06/2020] pantoprazole (PROTONIX) EC tablet 40 mg, 40 mg, Oral, Daily, Jennye Moccasin  J, PA-C .  [COMPLETED] peg 3350 powder (MOVIPREP) kit 100 g, 0.5 kit, Oral, Once, 100 g at 10/28/20 1817 **AND** peg 3350 powder (MOVIPREP) kit 100 g, 0.5 kit, Oral, Once, Bridgett Larsson, Lydia D, RPH .  polyethylene glycol (MIRALAX / GLYCOLAX) packet 17 g, 17 g, Oral, Daily PRN, Dahal, Binaya, MD .  senna-docusate (Senokot-S) tablet 1 tablet, 1 tablet, Oral, QHS, Dahal, Binaya, MD, 1 tablet at 10/29/20 2056 .  sodium chloride flush (NS) 0.9 % injection 3 mL, 3 mL, Intravenous, Q12H, Dwyane Dee, MD, 3 mL at 10/30/20 0910   Patients Current Diet:     Diet Order                      Diet regular Room service appropriate? Yes; Fluid consistency: Thin  Diet effective now                      Precautions / Restrictions Precautions Precautions: Fall Precaution Comments: unsteady knees with recently injected joints Restrictions Weight Bearing Restrictions: No    Has the patient had 2 or more falls or a fall with injury in the past year? No   Prior Activity Level Community (5-7x/wk): Independent and driving until 3 weeks pta   Prior Functional Level Self Care: Did the patient need help bathing, dressing, using the toilet or eating? Independent   Indoor Mobility: Did the patient need assistance with walking from room to room (with or without device)?  Independent   Stairs: Did the patient need assistance with internal or external stairs (with or without device)? Independent   Functional Cognition: Did the patient need help planning regular tasks such as shopping or remembering to take medications? Independent   Home Assistive Devices / Equipment Home Assistive Devices/Equipment: Eyeglasses Home Equipment: Cane - single point,Shower seat   Prior Device Use: Indicate devices/aids used by the patient prior to current illness, exacerbation or injury? None of the above   Current Functional Level Cognition   Overall Cognitive Status: Within Functional Limits for tasks assessed Orientation Level: Oriented to person,Oriented to place,Oriented to situation General Comments: Pt with grandson (who is an MD) in room confirming info from pt. Pt following all commands.    Extremity Assessment (includes Sensation/Coordination)   Upper Extremity Assessment: Defer to OT evaluation RUE Deficits / Details: AROM, WFLs 3+/5 MM grade LUE Deficits / Details: AROM, WFLs 3+/5 MM grade  Lower Extremity Assessment: Generalized weakness,RLE deficits/detail RLE Deficits / Details: painful to touch knee; very poor ROM RLE: Unable to fully assess due to pain LLE Deficits / Details: AAROM, limited due to pain LLE: Unable to fully assess due to pain     ADLs   Overall ADL's : Needs assistance/impaired Eating/Feeding: Set up,Sitting Grooming: Wash/dry hands,Wash/dry face,Set up,Sitting Grooming Details (indicate cue type and reason): in recliner Upper Body Bathing: Moderate assistance,Bed level Lower Body Bathing: Total assistance,Bed level Upper Body Dressing : Moderate assistance,Bed level Lower Body Dressing: Moderate assistance,Sit to/from stand,+2 for safety/equipment Lower Body Dressing Details (indicate cue type and reason): Pt able to don and doff sock without assist from seated position - will need mod A for articles of clothing requring  sit<>stand Toilet Transfer: Moderate assistance,+2 for safety/equipment,BSC,RW Toilet Transfer Details (indicate cue type and reason): pulls up on RW - needs vc for safety and instruction on how to use RW safely mod A +2 for boost Toileting- Clothing Manipulation and Hygiene: Total assistance,+2 for physical assistance,+2 for safety/equipment Toileting - Clothing  Manipulation Details (indicate cue type and reason): purewick Functional mobility during ADLs: Minimal assistance,+2 for safety/equipment,Rolling walker,Cueing for sequencing,Cueing for safety General ADL Comments: Improved pain management due to injections in Bil knees yesterday. improved access to LB for ADL from seated position. Needs significant instruction on how to use RW     Mobility   Overal bed mobility: Needs Assistance Bed Mobility: Supine to Sit Supine to sit: Min guard,HOB elevated General bed mobility comments: no physical assist needed, min guard for lines     Transfers   Overall transfer level: Needs assistance Equipment used: Rolling walker (2 wheeled) Transfer via Lift Equipment: Stedy Transfers: Sit to/from Stand Sit to Stand: Mod assist,+2 safety/equipment,+2 physical assistance General transfer comment: Pt needed a lot of education on safety and use of RW - cues for hand placement.     Ambulation / Gait / Stairs / Wheelchair Mobility   Ambulation/Gait Ambulation/Gait assistance: Herbalist (Feet): 18 Feet Assistive device: Rolling walker (2 wheeled),2 person hand held assist Gait Pattern/deviations: Step-through pattern,Step-to pattern,Decreased stride length,Wide base of support,Trunk flexed General Gait Details: requires frequent instructions for clearing walker from obstacles and maintaining space between she and walker Gait velocity: reduced, variable Gait velocity interpretation: <1.8 ft/sec, indicate of risk for recurrent falls     Posture / Balance Balance Overall balance assessment:  Needs assistance Sitting-balance support: Feet supported Sitting balance-Leahy Scale: Fair Standing balance support: Bilateral upper extremity supported,During functional activity Standing balance-Leahy Scale: Poor Standing balance comment: reliant on external support     Special needs/care consideration      Previous Home Environment  Living Arrangements:  (pt's 47 year old daughter lives with her)  Lives With: Daughter Available Help at Discharge: [facility] PRN/intermittently (almost 24/7) Type of Home: House Home Layout: Multi-level Alternate Level Stairs-Number of Steps: 14 Home Access: Stairs to enter Entrance Stairs-Rails: Right,Left,Can reach both Entrance Stairs-Number of Steps: 5 Bathroom Shower/Tub: Chiropodist: Standard Bathroom Accessibility: Yes How Accessible: Accessible via walker Home Care Services: No   Discharge Living Setting Plans for Discharge Living Setting: Patient's home,Lives with (comment) (daughter lives with patient) Type of Home at Discharge: House Discharge Home Layout: Multi-level Alternate Level Stairs-Number of Steps: 14 Discharge Home Access: Stairs to enter Entrance Stairs-Rails: Right,Left,Can reach both Entrance Stairs-Number of Steps: 5 Discharge Bathroom Shower/Tub: Tub/shower unit Discharge Bathroom Toilet: Standard Discharge Bathroom Accessibility: Yes How Accessible: Accessible via walker Does the patient have any problems obtaining your medications?: No   Social/Family/Support Systems Contact Information: daughter , [name] and grandson, Dr. Hilton Sinclair Anticipated Caregiver: daughter Anticipated Caregiver's Contact Information: see above Ability/Limitations of Caregiver: daughter works; grandson here from [place] Availability: Intermittent Discharge Plan Discussed with Primary Caregiver: Yes Is Caregiver In Agreement with Plan?: Yes Does Caregiver/Family have Issues with Lodging/Transportation  while Pt is in Rehab?: No   Goals Patient/Family Goal for Rehab: Mod I to supervision with PT and OT Expected length of stay: ELOS 10 to 14 days Pt/Family Agrees to Admission and willing to participate: Yes Program Orientation Provided & Reviewed with Pt/Caregiver Including Roles  & Responsibilities: Yes   Decrease burden of Care through IP rehab admission: n/a   Possible need for SNF placement upon discharge: not anticipated   Patient Condition: I have reviewed medical records from [facility] , spoken with  patient and family member. I met with patient at the bedside for inpatient rehabilitation assessment.  Patient will benefit from ongoing PT and OT, can actively participate in  3 hours of therapy a day 5 days of the week, and can make measurable gains during the admission.  Patient will also benefit from the coordinated team approach during an Inpatient Acute Rehabilitation admission.  The patient will receive intensive therapy as well as Rehabilitation physician, nursing, social worker, and care management interventions.  Due to bladder management, bowel management, safety, skin/wound care, disease management, medication administration, pain management and patient education the patient requires 24 hour a day rehabilitation nursing.  The patient is currently min to mod assist with mobility and basic ADLs.  Discharge setting and therapy post discharge at home with home health is anticipated.  Patient has agreed to participate in the Acute Inpatient Rehabilitation Program and will admit today.   Preadmission Screen Completed By: Danne Baxter RN MSN with updates by Cleatrice Burke, 10/30/2020 12:06 PM ______________________________________________________________________   Discussed status with Dr. Posey Pronto  on  10/30/2020 at  1208 and received approval for admission today.   Admission Coordinator: Danne Baxter RN MSN with updates by Cleatrice Burke, RN, time  1208 Date   10/30/2020    Assessment/Plan: Diagnosis: Debility 1. Does the need for close, 24 hr/day Medical supervision in concert with the patient's rehab needs make it unreasonable for this patient to be served in a less intensive setting? Yes  2. Co-Morbidities requiring supervision/potential complications: breast cancer with lumpectomy and radiation, lung cancer with partial upper lobe lobectomy 2006, AKI, pancytopenia, tachypnea (monitor RR and O2 Sats with increased physical exertion) 3. Due to bowel management, safety, skin/wound care, disease management, pain management and patient education, does the patient require 24 hr/day rehab nursing? Yes 4. Does the patient require coordinated care of a physician, rehab nurse, PT, OT to address physical and functional deficits in the context of the above medical diagnosis(es)? Yes Addressing deficits in the following areas: balance, endurance, locomotion, strength, transferring, bathing, dressing, toileting and psychosocial support 5. Can the patient actively participate in an intensive therapy program of at least 3 hrs of therapy 5 days a week? Yes 6. The potential for patient to make measurable gains while on inpatient rehab is excellent 7. Anticipated functional outcomes upon discharge from inpatient rehab: supervision PT, supervision OT, n/a SLP 8. Estimated rehab length of stay to reach the above functional goals is: 10-14 days. 9. Anticipated discharge destination: Home 10. Overall Rehab/Functional Prognosis: excellent     MD Signature: Delice Lesch, MD, ABPMR          Revision History                                       Note Details  Author Jamse Arn, MD File Time 10/30/2020 12:18 PM  Author Type Physician Status Signed  Last Editor Jamse Arn, MD Service Physical Medicine and [place] # [id number] Admit Date 10/30/2020

## 2020-10-30 NOTE — Plan of Care (Signed)
  Problem: Education: Goal: Knowledge of General Education information will improve Description Including pain rating scale, medication(s)/side effects and non-pharmacologic comfort measures Outcome: Progressing   

## 2020-10-30 NOTE — Progress Notes (Signed)
Inpatient Rehabilitation Medication Review by a Pharmacist  A complete drug regimen review was completed for this patient to identify any potential clinically significant medication issues.  Clinically significant medication issues were identified:  no  Check AMION for pharmacist assigned to patient if future medication questions/issues arise during this admission.  Pharmacist comments:   Time spent performing this drug regimen review (minutes):  51min  Cletis Clack S. Alford Highland, PharmD, BCPS Clinical Staff Pharmacist [email or web address] [place] 10/30/2020 3:14 PM

## 2020-10-30 NOTE — H&P (Signed)
Physical Medicine and Rehabilitation Admission H&P    Chief Complaint  Patient presents with  . Weakness  : HPI: Lisa [name] 86 year old right-handed female with history of breast cancer 1999 treated with lumpectomy and radiation as well as tamoxifen for 5 years, lung cancer with partial upper lobe lobectomy 2006, AKI thought secondary to ARB seen by nephrology services in the past, pancytopenia followed by hematology services in the past, hypothyroidism, quit smoking 29 years ago and gout.  History taken from chart review and patient.  Patient lives with her 20 year old daughter.  Multilevel home 5 steps to entry.  Independent until approximately 3 weeks ago with progressive decrease in mobility.  She presented on 10/21/2020 with generalized weakness, nausea, weight loss, decreased oral intake x3 weeks.  Admission chemistries sodium 122, glucose 121 BUN 30 creatinine 1.53, hemoglobin 10.9, urinalysis negative nitrite, urine culture no growth, uric acid 9.5, troponin 35-41, TSH 14.355, osmolality 276.  She was noted to have new onset atrial fibrillation, cardiology consulted.  Echocardiogram with ejection fraction of 65-70 %, no wall motion abnormalities grade 1 diastolic dysfunction.  She was placed on beta-blocker and monitored.  Lower extremity Doppler negative for DVT.  Gastroenterology services consulted for weight loss and poor appetite.  Underwent upper GI endoscopy on 10/23/2020 per Dr. Havery Moros.  No focal ulcerations noted.  There was a 3 cm hiatal hernia present.  The esophagus was tortuous.  Biopsies were taken with cold forceps in the gastric body.  H. pylori was positive and she was started on triple therapy regimen x2 weeks total and then continue Protonix twice daily.  Orthopedic services consulted 10/26/2020 for bilateral knee joint effusions x-ray showed arthritic changes.  She did undergo joint aspiration of both knees.  Fluid analysis showed no evidence of infection but urate  crystals diagnostic data arthritis.  She was placed on allopurinol but hold on colchicine due to history of AKI.  Her renal function has stabilized with latest creatinine 1.34 and monitored by renal services.  She is tolerating a regular diet.  Due to patient's decrease in functional ability and need for assist with ADLs she was admitted for a comprehensive rehab program.  Please see preadmission assessment from earlier today as well.  Review of Systems  HENT: Negative for hearing loss.   Eyes: Negative for blurred vision and double vision.  Respiratory: Negative for cough and shortness of breath.   Cardiovascular: Negative for chest pain.  Gastrointestinal: Negative for diarrhea and melena.  Genitourinary: Negative for dysuria, flank pain and hematuria.  Musculoskeletal: Positive for joint pain and myalgias.  Skin: Negative for rash.  Neurological: Positive for tremors. Negative for sensory change and weakness.  All other systems reviewed and are negative.  Past Medical History:  Diagnosis Date  . Adenocarcinoma of left lung ([place]) 2006  . Arthritis   . Asthma   . Cancer of breast, female ([place])   . Cancer of lung ([place])   . Hypertension   . Hypothyroidism   . Invasive ductal carcinoma of left breast ([place]) 1999  . Neutropenia ([place]) 06/09/2016  . Personal history of radiation therapy    Past Surgical History:  Procedure Laterality Date  . BIOPSY  10/23/2020   Procedure: BIOPSY;  Surgeon: Yetta Flock, MD;  Location: [facility] ENDOSCOPY;  Service: Gastroenterology;;  . BREAST BIOPSY     left axillary node dissection  . BREAST LUMPECTOMY Left   . COLONOSCOPY  03/08/2003   IOX:[id number] rectal polyps destroyed with the tip of  the snare/Polyps at hepatic flexure, splenic flexure at 35 cm/Left-sided diverticula: unable to retrieve path  . COLONOSCOPY  09/12/2008   XBM:WUXLKG rectum and distal sigmoid diminutive polyps/scattered left sided diverticulum. hyperplastic  . COLONOSCOPY N/A  03/06/2013   MWN:[id number] polyp-removed as described above; colonic diverticulosis. hyperplastic polyps. next TCS 03/2018  . ESOPHAGOGASTRODUODENOSCOPY (EGD) WITH PROPOFOL N/A 10/23/2020   Procedure: ESOPHAGOGASTRODUODENOSCOPY (EGD) WITH PROPOFOL;  Surgeon: Yetta Flock, MD;  Location: [place];  Service: Gastroenterology;  Laterality: N/A;  . FOOT SURGERY    . LUNG REMOVAL, PARTIAL     upper lobe   Family History  Problem Relation Age of Onset  . Cancer Mother   . Cancer Sister   . Colon cancer Neg Hx    Social History:  reports that she quit smoking about 29 years ago. She has a 26.25 pack-year smoking history. She has never used smokeless tobacco. She reports that she does not drink alcohol and does not use drugs. Allergies:  Allergies  Allergen Reactions  . Meloxicam Other (See Comments)    Caused an injury to the kidneys, per nephrologist  . Micardis Hct [Telmisartan-Hctz] Other (See Comments)    Caused an injury to the kidneys, per nephrologist   Medications Prior to Admission  Medication Sig Dispense Refill  . clobetasol ointment (TEMOVATE) 6.44 % Apply 1 application topically as needed (for irritation).    . Iron-FA-B Cmp-C-Biot-Probiotic (FUSION PLUS) CAPS Take 1 capsule by mouth daily.    Marland Kitchen albuterol (PROVENTIL,VENTOLIN) 90 MCG/ACT inhaler Inhale 2 puffs into the lungs every 6 (six) hours as needed for wheezing or shortness of breath.    . ferrous sulfate 325 (65 FE) MG tablet Take 325 mg by mouth daily with breakfast.    . levothyroxine (SYNTHROID, LEVOTHROID) 88 MCG tablet Take 88 mcg by mouth daily before breakfast.    . metoprolol succinate (TOPROL-XL) 50 MG 24 hr tablet Take 50 mg by mouth daily. Take with or immediately following a meal.    . Multiple Vitamin (MULTIVITAMIN WITH MINERALS) TABS tablet Take 1 tablet by mouth daily.    . TRELEGY ELLIPTA 100-62.5-25 MCG/INH AEPB Inhale 1 puff into the lungs daily.       Drug Regimen Review Drug regimen was  reviewed and remains appropriate with no significant issues identified.  Home: Home Living Family/patient expects to be discharged to:: Private residence Living Arrangements:  (pt's 85 year old daughter lives with her) Available Help at Discharge: Family,Available PRN/intermittently (almost 24/7) Type of Home: House Home Access: Stairs to enter [employer] of Steps: 5 Entrance Stairs-Rails: Right,Left,Can reach both Home Layout: Multi-level Alternate Level Stairs-Number of Steps: 14 Bathroom Shower/Tub: Chiropodist: Standard Bathroom Accessibility: Yes Home Equipment: Radio producer - single point,Shower seat  Lives With: Daughter   Functional History: Prior Function Level of Independence: Independent Gait / Transfers Assistance Needed: in the past 3 weeks, pt has required more assist with mobility and getting OOB. Pt with recent weight loss and progressive decline in function after being taken off gout medication due to poor kidney function, ADL's / Homemaking Assistance Needed: Pt requiring increased assist for ADL, mostly LB. Pt still was bathing in bathtub and assisted with transfer. >3 weeks ago, pt was independent and driving.  Functional Status:  Mobility: Bed Mobility Overal bed mobility: Needs Assistance Bed Mobility: Supine to Sit Supine to sit: Min guard,HOB elevated General bed mobility comments: no physical assist needed, min guard for lines Transfers Overall transfer level: Needs assistance Equipment used:  Rolling walker (2 wheeled) Transfer via Lift Equipment: Stedy Transfers: Sit to/from Stand Sit to Stand: Mod assist,+2 safety/equipment,+2 physical assistance General transfer comment: Pt needed a lot of education on safety and use of RW - cues for hand placement. Ambulation/Gait Ambulation/Gait assistance: Min assist Gait Distance (Feet): 18 Feet Assistive device: Rolling walker (2 wheeled),2 person hand held assist Gait  Pattern/deviations: Step-through pattern,Step-to pattern,Decreased stride length,Wide base of support,Trunk flexed General Gait Details: requires frequent instructions for clearing walker from obstacles and maintaining space between she and walker Gait velocity: reduced, variable Gait velocity interpretation: <1.8 ft/sec, indicate of risk for recurrent falls    ADL: ADL Overall ADL's : Needs assistance/impaired Eating/Feeding: Set up,Sitting Grooming: Wash/dry hands,Wash/dry face,Set up,Sitting Grooming Details (indicate cue type and reason): in recliner Upper Body Bathing: Moderate assistance,Bed level Lower Body Bathing: Total assistance,Bed level Upper Body Dressing : Moderate assistance,Bed level Lower Body Dressing: Moderate assistance,Sit to/from stand,+2 for safety/equipment Lower Body Dressing Details (indicate cue type and reason): Pt able to don and doff sock without assist from seated position - will need mod A for articles of clothing requring sit<>stand Toilet Transfer: Moderate assistance,+2 for safety/equipment,BSC,RW Toilet Transfer Details (indicate cue type and reason): pulls up on RW - needs vc for safety and instruction on how to use RW safely mod A +2 for boost Toileting- Clothing Manipulation and Hygiene: Total assistance,+2 for physical assistance,+2 for safety/equipment Toileting - Clothing Manipulation Details (indicate cue type and reason): purewick Functional mobility during ADLs: Minimal assistance,+2 for safety/equipment,Rolling walker,Cueing for sequencing,Cueing for safety General ADL Comments: Improved pain management due to injections in Bil knees yesterday. improved access to LB for ADL from seated position. Needs significant instruction on how to use RW  Cognition: Cognition Overall Cognitive Status: Within Functional Limits for tasks assessed Orientation Level: Oriented X4 Cognition Arousal/Alertness: Awake/alert Behavior During Therapy: WFL for tasks  assessed/performed Overall Cognitive Status: Within Functional Limits for tasks assessed General Comments: Pt with grandson (who is an MD) in room confirming info from pt. Pt following all commands.  Physical Exam: Blood pressure 129/74, pulse 80, temperature 98.5 F (36.9 C), resp. rate 20, height 5\' 7"  (1.702 m), weight 88.8 kg, SpO2 94 %. Physical Exam Vitals reviewed.  Constitutional:      Appearance: She is obese.  HENT:     Head: Normocephalic and atraumatic.     Right Ear: External ear normal.     Left Ear: External ear normal.     Nose: Nose normal.  Eyes:     General:        Right eye: No discharge.        Left eye: No discharge.     Extraocular Movements: Extraocular movements intact.  Cardiovascular:     Rate and Rhythm: Normal rate and regular rhythm.  Pulmonary:     Effort: Pulmonary effort is normal. No respiratory distress.     Breath sounds: No stridor.  Abdominal:     General: Abdomen is flat. There is no distension.  Musculoskeletal:     Cervical back: Normal range of motion and neck supple.     Comments: No edema or tenderness in extremities  Skin:    General: Skin is warm and dry.  Neurological:     Mental Status: She is alert.     Comments: Alert and oriented. Follows commands. HOH Bilateral upper extremities: 5/5 proximal distal bilateral lower extremities: Hip flexion: 4-4+/5, distally 5/5  Psychiatric:        Mood and Affect:  Mood normal.        Behavior: Behavior normal.     Results for orders placed or performed during the hospital encounter of 10/21/20 (from the past 48 hour(s))  Renal function panel     Status: Abnormal   Collection Time: 10/29/20  3:12 AM  Result Value Ref Range   Sodium 130 (L) 135 - 145 mmol/L   Potassium 4.3 3.5 - 5.1 mmol/L   Chloride 99 98 - 111 mmol/L   CO2 22 22 - 32 mmol/L   Glucose, Bld 119 (H) 70 - 99 mg/dL    Comment: Glucose reference range applies only to samples taken after fasting for at least 8 hours.    BUN 23 8 - 23 mg/dL   Creatinine, Ser 1.13 (H) 0.44 - 1.00 mg/dL   Calcium 8.5 (L) 8.9 - 10.3 mg/dL   Phosphorus 3.0 2.5 - 4.6 mg/dL   Albumin 2.5 (L) 3.5 - 5.0 g/dL   GFR, Estimated 48 (L) >60 mL/min    Comment: (NOTE) Calculated using the CKD-EPI Creatinine Equation (2021)    Anion gap 9 5 - 15    Comment: Performed at [facility] [address], [place], [place] [address]  CBC with Differential/Platelet     Status: Abnormal   Collection Time: 10/29/20  3:12 AM  Result Value Ref Range   WBC 4.1 4.0 - 10.5 K/uL    Comment: WHITE COUNT CONFIRMED ON SMEAR   RBC 2.92 (L) 3.87 - 5.11 MIL/uL   Hemoglobin 8.4 (L) 12.0 - 15.0 g/dL   HCT 27.8 (L) 36.0 - 46.0 %   MCV 95.2 80.0 - 100.0 fL   MCH 28.8 26.0 - 34.0 pg   MCHC 30.2 30.0 - 36.0 g/dL   RDW 15.6 (H) 11.5 - 15.5 %   Platelets 162 150 - 400 K/uL   nRBC 1.2 (H) 0.0 - 0.2 %   Neutrophils Relative % 48 %   Neutro Abs 1.9 1.7 - 7.7 K/uL   Lymphocytes Relative 18 %   Lymphs Abs 0.7 0.7 - 4.0 K/uL   Monocytes Relative 20 %   Monocytes Absolute 0.8 0.1 - 1.0 K/uL   Eosinophils Relative 0 %   Eosinophils Absolute 0.0 0.0 - 0.5 K/uL   Basophils Relative 0 %   Basophils Absolute 0.0 0.0 - 0.1 K/uL   Immature Granulocytes 14 %   Abs Immature Granulocytes 0.57 (H) 0.00 - 0.07 K/uL    Comment: Performed at [facility], [place] [address] [place], [place] [address]  Parathyroid hormone, intact (no Ca)     Status: None   Collection Time: 10/29/20  3:12 AM  Result Value Ref Range   PTH 50 15 - 65 pg/mL    Comment: (NOTE) Performed At: [facility] [address] [place], [place] [id number] Rush Farmer MD [id number]   VITAMIN D 25 Hydroxy (Vit-D Deficiency, Fractures)     Status: None   Collection Time: 10/29/20  3:12 AM  Result Value Ref Range   Vit D, 25-Hydroxy 46.9 30 - 100 ng/mL    Comment: Performed at [facility] (NOTE) Vitamin D deficiency has been defined by the [place] practice guideline as a level of serum 25-OH  vitamin D less than 20 ng/mL (1,2). The Endocrine Society went on to  further define vitamin D insufficiency as a level between 21 and 29  ng/mL (2).  1. IOM (Institute of Medicine). 2010. Dietary reference  intakes for  calcium and D. [place]: The [employer]. 2. Holick MF, Binkley Weld, Bischoff-Ferrari HA, et al. Evaluation,  treatment, and prevention of vitamin D deficiency: an Endocrine  Society clinical practice guideline, JCEM. 2011 Jul; 96(7): 1911-30.  Performed at [facility], [place] [address], [place], [place] [address]   Magnesium     Status: Abnormal   Collection Time: 10/29/20  3:12 AM  Result Value Ref Range   Magnesium 1.6 (L) 1.7 - 2.4 mg/dL    Comment: Performed at [facility] [address] [place], [place] [address]  Renal function panel     Status: Abnormal   Collection Time: 10/30/20  3:03 AM  Result Value Ref Range   Sodium 132 (L) 135 - 145 mmol/L   Potassium 4.1 3.5 - 5.1 mmol/L   Chloride 99 98 - 111 mmol/L   CO2 23 22 - 32 mmol/L   Glucose, Bld 110 (H) 70 - 99 mg/dL    Comment: Glucose reference range applies only to samples taken after fasting for at least 8 hours.   BUN 22 8 - 23 mg/dL   Creatinine, Ser 1.34 (H) 0.44 - 1.00 mg/dL   Calcium 8.9 8.9 - 10.3 mg/dL   Phosphorus 4.0 2.5 - 4.6 mg/dL   Albumin 2.7 (L) 3.5 - 5.0 g/dL   GFR, Estimated 39 (L) >60 mL/min    Comment: (NOTE) Calculated using the CKD-EPI Creatinine Equation (2021)    Anion gap 10 5 - 15    Comment: Performed at [facility] [address] [place], [place] [address]  Magnesium     Status: Abnormal   Collection Time: 10/30/20  3:03 AM  Result Value Ref Range   Magnesium 1.6 (L) 1.7 - 2.4 mg/dL    Comment: Performed at [facility] [address], [place], [place] [address]  Magnesium     Status: None   Collection Time: 10/30/20 11:05 AM  Result Value Ref Range    Magnesium 2.1 1.7 - 2.4 mg/dL    Comment: Performed at [facility] [address], [place], [place] [address]   No results found.     Medical Problem List and Plan: 1.  Debility secondary to AKI superimposed on CKD stage III/hyponatremia/gout/atrial fibrillation  -patient may shower  -ELOS/Goals: 10-14 days/supervision/mod I  Admit to CIR 2.  Antithrombotics: -DVT/anticoagulation: SCDs  -antiplatelet therapy: N/A 3. Pain Management: Tylenol as needed 4. Mood: Provide emotional support  -antipsychotic agents: N/A 5. Neuropsych: This patient is capable of making decisions on her own behalf. 6. Skin/Wound Care: Routine skin checks 7. Fluids/Electrolytes/Nutrition: Routine in and outs  CMP ordered for tomorrow. 8.  Acute new onset atrial fibrillation: Monitor with increased exertion. 9.  H. pylori positive.  Completed triple regimen therapy and then maintain Protonix twice daily 10.  Bilateral lower extremity knee pain.  Arthrocentesis of both knees.  Fluid analysis no evidence of infection but urate crystals diagnostic gouty arthritis.  Continue allopurinol.  No colchicine due to AKI  Monitor with increased activity 11.  Hypothyroidism: Synthroid 12.  Decreased nutritional storage.  Dietary follow-up 13.  AKI superimposed on CKD stage III.    CMP ordered for tomorrow a.m. 14.  History of breast cancer in 1999 treated with lumpectomy radiation as well as tamoxifen.  Follow-up outpatient 15.  History of lung cancer.  Status post partial upper lobe lobectomy 2006.  Follow-up outpatient 16.  Anemia of chronic disease/pancytopenia.  Patient has been seen by hematology services outpatient.  CBC ordered for tomorrow a.m.  Lavon Paganini Angiulli, PA-C 10/30/2020  I have personally performed a face to face diagnostic evaluation, including, but not limited to relevant history and physical exam findings, of this patient and developed relevant assessment and plan.  Additionally, I have  reviewed and concur with the physician assistant's documentation above.  Delice Lesch, MD, ABPMR  The patient's status has not changed. Any changes from the pre-admission screening or documentation from the acute chart are noted above.   Delice Lesch, MD, ABPMR

## 2020-10-30 NOTE — Progress Notes (Signed)
Patient arrived to unit, no complications noted at this time.  Audie Clear, LPN

## 2020-10-30 NOTE — Progress Notes (Signed)
Inpatient Rehabilitation  Patient information reviewed and entered into eRehab system by Saniya Tranchina M. Jayden Kratochvil, M.A., CCC/SLP, PPS Coordinator.  Information including medical coding, functional ability and quality indicators will be reviewed and updated through discharge.    

## 2020-10-30 NOTE — Discharge Instructions (Addendum)
Gout  Gout is painful swelling of your joints. Gout is a type of arthritis. It is caused by having too much uric acid in your body. Uric acid is a chemical that is made when your body breaks down substances called purines. If your body has too much uric acid, sharp crystals can form and build up in your joints. This causes pain and swelling. Gout attacks can happen quickly and be very painful (acute gout). Over time, the attacks can affect more joints and happen more often (chronic gout). What are the causes?  Too much uric acid in your blood. This can happen because: ? Your kidneys do not remove enough uric acid from your blood. ? Your body makes too much uric acid. ? You eat too many foods that are high in purines. These foods include organ meats, some seafood, and beer.  Trauma or stress. What increases the risk?  Having a family history of gout.  Being female and middle-aged.  Being female and having gone through menopause.  Being very overweight (obese).  Drinking alcohol, especially beer.  Not having enough water in the body (being dehydrated).  Losing weight too quickly.  Having an organ transplant.  Having lead poisoning.  Taking certain medicines.  Having kidney disease.  Having a skin condition called psoriasis. What are the signs or symptoms? An attack of acute gout usually happens in just one joint. The most common place is the big toe. Attacks often start at night. Other joints that may be affected include joints of the feet, ankle, knee, fingers, wrist, or elbow. Symptoms of an attack may include:  Very bad pain.  Warmth.  Swelling.  Stiffness.  Shiny, red, or purple skin.  Tenderness. The affected joint may be very painful to touch.  Chills and fever. Chronic gout may cause symptoms more often. More joints may be involved. You may also have white or yellow lumps (tophi) on your hands or feet or in other areas near your joints. How is this  treated?  Treatment for this condition has two phases: treating an acute attack and preventing future attacks.  Acute gout treatment may include: ? NSAIDs. ? Steroids. These are taken by mouth or injected into a joint. ? Colchicine. This medicine relieves pain and swelling. It can be given by mouth or through an IV tube.  Preventive treatment may include: ? Taking small doses of NSAIDs or colchicine daily. ? Using a medicine that reduces uric acid levels in your blood. ? Making changes to your diet. You may need to see a food expert (dietitian) about what to eat and drink to prevent gout. Follow these instructions at home: During a gout attack   If told, put ice on the painful area: ? Put ice in a plastic bag. ? Place a towel between your skin and the bag. ? Leave the ice on for 20 minutes, 2-3 times a day.  Raise (elevate) the painful joint above the level of your heart as often as you can.  Rest the joint as much as possible. If the joint is in your leg, you may be given crutches.  Follow instructions from your doctor about what you cannot eat or drink. Avoiding future gout attacks  Eat a low-purine diet. Avoid foods and drinks such as: ? Liver. ? Kidney. ? Anchovies. ? Asparagus. ? Herring. ? Mushrooms. ? Mussels. ? Beer.  Stay at a healthy weight. If you want to lose weight, talk with your doctor. Do not lose weight  too fast.  Start or continue an exercise plan as told by your doctor. Eating and drinking  Drink enough fluids to keep your pee (urine) pale yellow.  If you drink alcohol: ? Limit how much you use to:  0-1 drink a day for women.  0-2 drinks a day for men. ? Be aware of how much alcohol is in your drink. In the [place] one drink equals one 12 oz bottle of beer (355 mL), one 5 oz glass of wine (148 mL), or one 1 oz glass of hard liquor (44 mL). General instructions  Take over-the-counter and prescription medicines only as told by your doctor.  Do  not drive or use heavy machinery while taking prescription pain medicine.  Return to your normal activities as told by your doctor. Ask your doctor what activities are safe for you.  Keep all follow-up visits as told by your doctor. This is important. Contact a doctor if:  You have another gout attack.  You still have symptoms of a gout attack after 10 days of treatment.  You have problems (side effects) because of your medicines.  You have chills or a fever.  You have burning pain when you pee (urinate).  You have pain in your lower back or belly. Get help right away if:  You have very bad pain.  Your pain cannot be controlled.  You cannot pee. Summary  Gout is painful swelling of the joints.  The most common site of pain is the big toe, but it can affect other joints.  Medicines and avoiding some foods can help to prevent and treat gout attacks. This information is not intended to replace advice given to you by your health care provider. Make sure you discuss any questions you have with your health care provider. Document Revised: 05/11/2018 Document Reviewed: 05/11/2018 Elsevier Patient Education  [place]. Atrial Fibrillation  Atrial fibrillation is a type of heartbeat that is irregular or fast. If you have this condition, your heart beats without any order. This makes it hard for your heart to pump blood in a normal way. Atrial fibrillation may come and go, or it may become a long-lasting problem. If this condition is not treated, it can put you at higher risk for stroke, heart failure, and other heart problems. What are the causes? This condition may be caused by diseases that damage the heart. They include:  High blood pressure.  Heart failure.  Heart valve disease.  Heart surgery. Other causes include:  Diabetes.  Thyroid disease.  Being overweight.  Kidney disease. Sometimes the cause is not known. What increases the risk? You are more  likely to develop this condition if:  You are older.  You smoke.  You exercise often and very hard.  You have a family history of this condition.  You are a man.  You use drugs.  You drink a lot of alcohol.  You have lung conditions, such as emphysema, pneumonia, or COPD.  You have sleep apnea. What are the signs or symptoms? Common symptoms of this condition include:  A feeling that your heart is beating very fast.  Chest pain or discomfort.  Feeling short of breath.  Suddenly feeling light-headed or weak.  Getting tired easily during activity.  Fainting.  Sweating. In some cases, there are no symptoms. How is this treated? Treatment for this condition depends on underlying conditions and how you feel when you have atrial fibrillation. They include:  Medicines to: ? Prevent blood clots. ?  Treat heart rate or heart rhythm problems.  Using devices, such as a pacemaker, to correct heart rhythm problems.  Doing surgery to remove the part of the heart that sends bad signals.  Closing an area where clots can form in the heart (left atrial appendage). In some cases, your doctor will treat other underlying conditions. Follow these instructions at home: Medicines  Take over-the-counter and prescription medicines only as told by your doctor.  Do not take any new medicines without first talking to your doctor.  If you are taking blood thinners: ? Talk with your doctor before you take any medicines that have aspirin or NSAIDs, such as ibuprofen, in them. ? Take your medicine exactly as told by your doctor. Take it at the same time each day. ? Avoid activities that could hurt or bruise you. Follow instructions about how to prevent falls. ? Wear a bracelet that says you are taking blood thinners. Or, carry a card that lists what medicines you take. Lifestyle      Do not use any products that have nicotine or tobacco in them. These include cigarettes, e-cigarettes,  and chewing tobacco. If you need help quitting, ask your doctor.  Eat heart-healthy foods. Talk with your doctor about the right eating plan for you.  Exercise regularly as told by your doctor.  Do not drink alcohol.  Lose weight if you are overweight.  Do not use drugs, including cannabis. General instructions  If you have a condition that causes breathing to stop for a short period of time (apnea), treat it as told by your doctor.  Keep a healthy weight. Do not use diet pills unless your doctor says they are safe for you. Diet pills may make heart problems worse.  Keep all follow-up visits as told by your doctor. This is important. Contact a doctor if:  You notice a change in the speed, rhythm, or strength of your heartbeat.  You are taking a blood-thinning medicine and you get more bruising.  You get tired more easily when you move or exercise.  You have a sudden change in weight. Get help right away if:   You have pain in your chest or your belly (abdomen).  You have trouble breathing.  You have side effects of blood thinners, such as blood in your vomit, poop (stool), or pee (urine), or bleeding that cannot stop.  You have any signs of a stroke. "BE FAST" is an easy way to remember the main warning signs: ? B - Balance. Signs are dizziness, sudden trouble walking, or loss of balance. ? E - Eyes. Signs are trouble seeing or a change in how you see. ? F - Face. Signs are sudden weakness or loss of feeling in the face, or the face or eyelid drooping on one side. ? A - Arms. Signs are weakness or loss of feeling in an arm. This happens suddenly and usually on one side of the body. ? S - Speech. Signs are sudden trouble speaking, slurred speech, or trouble understanding what people say. ? T - Time. Time to call emergency services. Write down what time symptoms started.  You have other signs of a stroke, such as: ? A sudden, very bad headache with no known  cause. ? Feeling like you may vomit (nausea). ? Vomiting. ? A seizure. These symptoms may be an emergency. Do not wait to see if the symptoms will go away. Get medical help right away. Call your local emergency services (911 in the  [place] Do not drive yourself to the hospital. Summary  Atrial fibrillation is a type of heartbeat that is irregular or fast.  You are at higher risk of this condition if you smoke, are older, have diabetes, or are overweight.  Follow your doctor's instructions about medicines, diet, exercise, and follow-up visits.  Get help right away if you have signs or symptoms of a stroke.  Get help right away if you cannot catch your breath, or you have chest pain or discomfort. This information is not intended to replace advice given to you by your health care provider. Make sure you discuss any questions you have with your health care provider. Document Revised: 04/12/2019 Document Reviewed: 04/12/2019 Elsevier Patient Education  [place].   Acute Urinary Retention, Female  Acute urinary retention is a condition in which a person is unable to pass urine. This can last for a short time or for a long time. If left untreated, it can result in kidney damage or other serious complications. What are the causes? This condition may be caused by:  Obstruction or narrowing of the tube that drains the bladder (urethra). This may be caused by surgery or problems with nearby organs, which can press or squeeze the urethra.  Problems with the nerves in the bladder. These can be caused by diseases, such as multiple sclerosis, or by spinal cord injuries.  Certain medicines.  Tumors in the area of the pelvis, bladder, or urethra.  Degenerative cognitive conditions such as delirium or dementia.  Diabetes.  Vaginal childbirth.  Bladder or urinary tract infection.  Constipation.  Blood in the urine (hematuria).  Injury to the bladder or urethra.  Psychological  (psychogenic) conditions. Someone may hold her urine due to trauma or because she does not want to use the bathroom. What are the signs or symptoms? Symptoms of this condition include:  Trouble urinating.  Pain in the lower abdomen. How is this diagnosed? This condition is diagnosed based on a physical exam and a medical history. You may also have other tests, including:  An ultrasound of the bladder or kidneys or both.  Blood tests.  A urine analysis.  Additional tests may be needed such as an MRI, kidney, or bladder function tests. How is this treated? Treatment for this condition may include:  Medicines.  Placing a thin, sterile tube (catheter) into the bladder to drain urine out of the body. This is called an indwelling urinary catheter. After being inserted, the catheter is held in place with a small balloon that is filled with sterile water. Urine drains from the catheter into a collection bag outside of the body.  Behavioral therapy.  Treatment for any underlying conditions.  If needed, you may be treated in the hospital for kidney function problems or to manage other complications. Follow these instructions at home:  Take over-the-counter and prescription medicines only as told by your health care provider. Avoid certain medicines, such as decongestants, antihistamines, and some prescription medicines. Do not take any medicine unless your health care provider has approved.  If you were given an indwelling urinary catheter, take care of it as told by your health care provider.  Drink enough fluid to keep your urine clear or pale yellow.  If you were prescribed an antibiotic, take it as told by your health care provider. Do not stop taking the antibiotic even if you start to feel better.  Do not use any products that contain nicotine or tobacco, such as cigarettes and e-cigarettes.  If you need help quitting, ask your health care provider.  Monitor any changes in your  symptoms. Tell your health care provider about any changes.  If instructed, monitor your blood pressure at home. Report changes as told by your health care provider.  Keep all follow-up visits as told by your health care provider. This is important. Contact a health care provider if:  You have uncomfortable bladder contractions that you cannot control (spasms) or you leak urine with the spasms. Get help right away if:  You have chills or fever.  You have blood in your urine.  You have a catheter and: ? Your catheter stops draining urine. ? Your catheter falls out. Summary  Acute urinary retention is a condition in which a person is unable to pass urine. If left untreated, it can result in kidney damage or other serious complications.  This condition may be caused by surgery or problems with nearby organs, which can press or squeeze the urethra.  Treatment may include medicines and placement of an indwelling urinary catheter.  Monitor any changes in your symptoms. Tell your health care provider about any changes. This information is not intended to replace advice given to you by your health care provider. Make sure you discuss any questions you have with your health care provider. Document Revised: 10/01/2017 Document Reviewed: 11/20/2016 Elsevier Patient Education  2020 [place].   Acute Urinary Retention, Female  Acute urinary retention means that you cannot pee (urinate) at all, or that you pee too little and your bladder is not emptied completely. If it is not treated, it can lead to kidney damage or other serious problems. Follow these instructions at home:  Take over-the-counter and prescription medicines only as told by your doctor. Ask your doctor what medicines you should stay away from. Do not take any medicine unless your doctor says it is okay to do so.  If you were sent home with a tube that drains pee from the bladder (catheter), take care of it as told by your  doctor.  Drink enough fluid to keep your pee clear or pale yellow.  If you were given an antibiotic, take it as told by your doctor. Do not stop taking the antibiotic even if you start to feel better.  Do not use any products that contain nicotine or tobacco, such as cigarettes and e-cigarettes. If you need help quitting, ask your doctor.  Watch for changes in your symptoms. Tell your doctor about them.  If told, keep track of any changes in your blood pressure at home. Tell your doctor about them.  Keep all follow-up visits as told by your doctor. This is important. Contact a doctor if:  You have spasms or you leak pee when you have spasms. Get help right away if:  You have chills or a fever.  You have blood in your pee.  You have a tube that drains the bladder and: ? The tube stops draining pee. ? The tube falls out. Summary  Acute urinary retention means that you cannot pee at all, or that you pee too little and your bladder is not emptied completely. If it is not treated, it can result in kidney damage or other serious problems.  If you were sent home with a tube that drains pee from the bladder, take care of it as told by your doctor.  Pay attention to any changes in your symptoms. Tell your doctor about them. This information is not intended to replace advice  given to you by your health care provider. Make sure you discuss any questions you have with your health care provider. Document Revised: 10/01/2017 Document Reviewed: 11/20/2016 Elsevier Patient Education  [place].   Acute Kidney Injury, Adult  Acute kidney injury is a sudden worsening of kidney function. The kidneys are organs that have several jobs. They filter the blood to remove waste products and extra fluid. They also maintain a healthy balance of minerals and hormones in the body, which helps control blood pressure and keep bones strong. With this condition, your kidneys do not do their jobs as  well as they should. This condition ranges from mild to severe. Over time it may develop into long-lasting (chronic) kidney disease. Early detection and treatment may prevent acute kidney injury from developing into a chronic condition. What are the causes? Common causes of this condition include:  A problem with blood flow to the kidneys. This may be caused by: ? Low blood pressure (hypotension) or shock. ? Blood loss. ? Heart and blood vessel (cardiovascular) disease. ? Severe burns. ? Liver disease.  Direct damage to the kidneys. This may be caused by: ? Certain medicines. ? A kidney infection. ? Poisoning. ? Being around or in contact with toxic substances. ? A surgical wound. ? A hard, direct hit to the kidney area.  A sudden blockage of urine flow. This may be caused by: ? Cancer. ? Kidney stones. ? An enlarged prostate in males. What are the signs or symptoms? Symptoms of this condition may not be obvious until the condition becomes severe. Symptoms of this condition can include:  Tiredness (lethargy), or difficulty staying awake.  Nausea or vomiting.  Swelling (edema) of the face, legs, ankles, or feet.  Problems with urination, such as: ? Abdominal pain, or pain along the side of your stomach (flank). ? Decreased urine production. ? Decrease in the force of urine flow.  Muscle twitches and cramps, especially in the legs.  Confusion or trouble concentrating.  Loss of appetite.  Fever. How is this diagnosed? This condition may be diagnosed with tests, including:  Blood tests.  Urine tests.  Imaging tests.  A test in which a sample of tissue is removed from the kidneys to be examined under a microscope (kidney biopsy). How is this treated? Treatment for this condition depends on the cause and how severe the condition is. In mild cases, treatment may not be needed. The kidneys may heal on their own. In more severe cases, treatment will involve:  Treating  the cause of the kidney injury. This may involve changing any medicines you are taking or adjusting your dosage.  Fluids. You may need specialized IV fluids to balance your body's needs.  Having a catheter placed to drain urine and prevent blockages.  Preventing problems from occurring. This may mean avoiding certain medicines or procedures that can cause further injury to the kidneys. In some cases treatment may also require:  A procedure to remove toxic wastes from the body (dialysis or continuous renal replacement therapy - CRRT).  Surgery. This may be done to repair a torn kidney, or to remove the blockage from the urinary system. Follow these instructions at home: Medicines  Take over-the-counter and prescription medicines only as told by your health care provider.  Do not take any new medicines without your health care provider's approval. Many medicines can worsen your kidney damage.  Do not take any vitamin and mineral supplements without your health care provider's approval. Many nutritional  supplements can worsen your kidney damage. Lifestyle  If your health care provider prescribed changes to your diet, follow them. You may need to decrease the amount of protein you eat.  Achieve and maintain a healthy weight. If you need help with this, ask your health care provider.  Start or continue an exercise plan. Try to exercise at least 30 minutes a day, 5 days a week.  Do not use any tobacco products, such as cigarettes, chewing tobacco, and e-cigarettes. If you need help quitting, ask your health care provider. General instructions  Keep track of your blood pressure. Report changes in your blood pressure as told by your health care provider.  Stay up to date with immunizations. Ask your health care provider which immunizations you need.  Keep all follow-up visits as told by your health care provider. This is important. Where to find more information  American Association of  Kidney Patients: BombTimer.gl  National Kidney Foundation: www.kidney.[place]: https://mathis.com/  [facility]: ? www.lifeoptions.org ? www.kidneyschool.org Contact a health care provider if:  Your symptoms get worse.  You develop new symptoms. Get help right away if:  You develop symptoms of worsening kidney disease, which include: ? Headaches. ? Abnormally dark or light skin. ? Easy bruising. ? Frequent hiccups. ? Chest pain. ? Shortness of breath. ? End of menstruation in women. ? Seizures. ? Confusion or altered mental status. ? Abdominal or back pain. ? Itchiness.  You have a fever.  Your body is producing less urine.  You have pain or bleeding when you urinate. Summary  Acute kidney injury is a sudden worsening of kidney function.  Acute kidney injury can be caused by problems with blood flow to the kidneys, direct damage to the kidneys, and sudden blockage of urine flow.  Symptoms of this condition may not be obvious until it becomes severe. Symptoms may include edema, lethargy, confusion, nausea or vomiting, and problems passing urine.  This condition can usually be diagnosed with blood tests, urine tests, and imaging tests. Sometimes a kidney biopsy is done to diagnose this condition.  Treatment for this condition often involves treating the underlying cause. It is treated with fluids, medicines, dialysis, diet changes, or surgery. This information is not intended to replace advice given to you by your health care provider. Make sure you discuss any questions you have with your health care provider. Document Revised: 10/01/2017 Document Reviewed: 10/09/2016 Elsevier Patient Education  2020 [employer].

## 2020-10-30 NOTE — Progress Notes (Signed)
Patient grandson had many questions and expressed concern of speaking with previous attending doctor. Dan PA spoke with family to address some of the questions that he could. Patient grandson wanting to hear from social work over the next few days. Explained to grandson about the procedures and therapy evaluations and conference schedule. Also informed grandson of conference that will be take place for patient the following week. Patient systolic pressure slightly elevated but has not flagged amused will re-check pressure before lunch as patient stated she was a little nervous about moving to rehab. Audible wheezes noted on exhalation. No further complications noted at this time.  Audie Clear, LPN

## 2020-10-30 NOTE — Progress Notes (Addendum)
Inpatient Rehabilitation Admissions Coordinator  I have CIR bed available to admit patient to today. I have contacted Dr. Nevada Crane and await her clearance to d/c to CIR today. Patient is aware.  Danne Baxter, RN, MSN Rehab Admissions Coordinator [phone] 10/30/2020 11:44 AM   Patient verified ok to d/c. I spoke with her grandson, Rosalita Chessman, by phone an he is in agreement. I will make the arrangements to admit today.  Danne Baxter, RN, MSN Rehab Admissions Coordinator [phone] 10/30/2020 12:06 PM

## 2020-10-30 NOTE — Progress Notes (Signed)
Patient denied any pain at this time. Audie Clear, LPN

## 2020-10-30 NOTE — Discharge Summary (Signed)
Discharge Summary  Lisa [name] OVZ:[id number] DOB: 05-22-1934  PCP: Celene Squibb, MD  Admit date: 10/21/2020 Discharge date: 10/30/2020  Time spent: 35 minutes   Recommendations for Outpatient Follow-up:  1. Transfer to Inpatient rehab 2. Follow-up with cardiology in 1 to 2 weeks. 3. Follow-up with your PCP in 1 to 2 weeks 4. Follow-up with GI in 1 to 2 weeks 5. Take your medications as prescribed.  Discharge Diagnoses:  Active Hospital Problems   Diagnosis Date Noted  . Acute renal failure superimposed on stage 3b chronic kidney disease ([place]) 10/21/2020  . Malnutrition of moderate degree 10/28/2020  . Bilateral knee effusions   . FTT (failure to thrive) in adult   . Decreased appetite   . Weakness   . Gout 10/21/2020  . Hypothyroidism 10/21/2020  . HTN (hypertension) 10/21/2020  . Weight loss 10/21/2020  . Generalized muscle weakness 10/21/2020  . Osteoarthritis 10/21/2020  . Atrial fibrillation with RVR ([place]) 10/21/2020  . Anemia 03/13/2019    Resolved Hospital Problems  No resolved problems to display.    Discharge Condition: Stable  Diet recommendation: Resume previous diet.  Vitals:   10/30/20 0518 10/30/20 0735  BP: 122/65 129/74  Pulse: 80 80  Resp: (!) 21 20  Temp: 98.3 F (36.8 C) 98.5 F (36.9 C)  SpO2: 90% 94%    History of present illness:  Patient is an 86 year old with PMH significant for breast cancer, lung cancer, hypothyroidism, gout who presented to the ED with progressive weakness, worse on the left lower extremity. She also has left lower extremity pain especially in her knee and ankle. Patient has 21-monthhistory of poor appetite and poor oral intake. She has had recent evaluation by hematology for pancytopenia. Also was seen by nephrology in the office for AKI thought to be secondary to ARB, hydrochlorothiazide and meloxicam, IV contrast on 10/24/2020 and urinary retention. Work-up in the ED revealed AKI, hyponatremia, and new  diagnosis of A. fib with RVR.    Patient was admitted to hospitalist service for further evaluation and management. Consultation services obtained with cardiology, GI, nephrology and orthopedics, signed off.  10/30/20: Seen and examined at her bedside.  No acute events overnight.  She is sitting up in a chair.  She is eager to go to inpatient rehab.  She has no new complaints.  Hospital Course:  Principal Problem:   Acute renal failure superimposed on stage 3b chronic kidney disease (HCC) Active Problems:   Anemia   Gout   Hypothyroidism   HTN (hypertension)   Weight loss   Generalized muscle weakness   Osteoarthritis   Atrial fibrillation with RVR (HCC)   Weakness   Decreased appetite   FTT (failure to thrive) in adult   Bilateral knee effusions   Malnutrition of moderate degree  AKI superimposed on CKD 3b: -Creatinine at baseline between 1.1-1.3.  Presented with an elevated creatinine of 1.53. -Related to recent use of NSAID, ARB, HCTZ, IV contrast, hypovolemia.  -Seen by Nephrology, signed off. -In order to help renal function recovery, on hold are tramadol, gabapentin and colchicine. -Creatinine level is 1.34 on 10/30/20. Recent Labs (within last 365 days)              Recent Labs    10/22/20 1859 10/23/20 0058 10/24/20 0224 10/25/20 0325 10/26/20 0240 10/26/20 1350 10/27/20 0151 10/27/20 0933 10/28/20 0118 10/29/20 0312  BUN 24* '20 9 23 ' 33* 35* 36* 31* 28* 23  CREATININE 1.09* 1.01* 0.95 1.35* 1.74* 1.70* 1.62*  1.39* 1.24* 1.13*     Resolving acute hyponatremia: -Likely a combination of SIADH and low solute intake. Fortunately, repeat CT scan chest did not show any recurrence of lung cancer. -Sodium 132 on 10/30/20 from 130 from 128 from 124 from 119 from 118 -Patient did not require Samsca. -Discussed with nephrology Dr. Carolin Sicks.  Patient will continue Lasix 10 mg daily at discharge and follow-up with PCP as an outpatient for labs.  May need nephrology  referral as an outpatient if this is a persistent problem. Last Labs               Recent Labs  Lab 10/22/20 1859 10/23/20 0058 10/24/20 0224 10/25/20 0325 10/26/20 0240 10/26/20 1350 10/27/20 0151 10/27/20 0933 10/28/20 0118 10/29/20 0312  NA 124* 125* 129* 121* 118* 119* 119* 124* 128* 130*     H. pylori positive -Underwent EGD on 10/23/20 -No evidence of bleeding but biopsy positive for H. Pylori. -10/25/20, started on triple therapy regimen along with Protonix 40 mg twice daily as recommended by GI.  After the 2 weeks course, patient will continue Protonix 40 mg daily. Follow up with GI outpatient  Anemia of chronic disease -Hemoglobin at baseline more than 10.  Since admission, she has had steady drop in hemoglobin, to the lowest of 7.9, up trended to 8.4.  No overt bleeding. -12/22, EGD, findings as stated above.  Diverticulosis noted in CT scan of abdomen.  Colonoscopy as an outpatient. Recent Labs (within last 365 days)                Recent Labs    12/19/19 1051 02/26/20 1049 07/09/20 1050 10/21/20 1123 10/22/20 0820 10/23/20 0058 10/25/20 0325 10/25/20 1059 10/26/20 0240 10/27/20 0151 10/28/20 0118 10/29/20 0312  HGB 11.8* 11.3* 10.9*   < >  --    < > 8.1*  --  7.9* 7.9* 8.4* 8.4*  MCV 104.2* 106.1* 105.3*   < >  --    < > 94.9  --  94.4 94.0 95.9 95.2  VITAMINB12 382 323 268  --  545  --   --   --   --   --   --   --   FOLATE 16.8  --  16.7  --   --   --   --   --   --   --   --   --   FERRITIN 188 200 368*  --   --   --   --  939*  --   --   --   --   TIBC 352 369 352  --   --   --   --   --   --   --   --   --   IRON 53 53 54  --   --   --   --   --   --   --   --   --    < > = values in this interval not displayed.     Bilateral lower extremity/knee pain -Multifactorial: Osteoarthritis, peripheral neuropathy, gout, deconditioning.  Lower extremities DVT scan negative -Elevated uric acid level on admission.  Started on colchicine and allopurinol.   -10/26/20, orthopedic Dr. Ninfa Linden did arthrocentesis of both knees.  Fluid analysis showed no evidence of infection but urate crystals diagnostic gouty arthritis.  -12/26, intra-articular injection of steroids given. -Pain control with Tylenol.  On hold are tramadol, gabapentin, colchicine because of AKI.  Follow up with  your PCP or orthopedic surgery Dr. Ninfa Linden.  Paroxysmal A fib RVR, mild elevation troponin: -Continue with metoprolol. -CHADSVASC score 4.  Seen by cardiology, recommended outpatient follow-up for decision on oral anticoagulation.   Contacted cardio Master via paging system on 10/30/2020 for follow-up appointment set up with cardiology.  Elevated inflammatory markers -Present with failure to thrive, anorexia, 20 pound weight loss.   -12/23, CT scan of chest abdomen and pelvis was negative for any recurrence of malignancy. -Patient has elevated ferritins, CRP, ESR level.  Probably related to flareup of gouty arthritis.   Follow-up with PCP, recheck levels outpatient.  Hypomagnesemia/hypophosphatemia -Magnesium level low at 1.6, repleted with 4 g IV magnesium on 10/30/2020.  -Phosphorus level was low at 1.3 >> 4.0 on 10/30/2020.   Improved with replacement.   PTH and vitamin D level sent by nephrology.  In process. Last Labs             Recent Labs  Lab 10/24/20 0224 10/25/20 0325 10/26/20 0240 10/26/20 1350 10/27/20 0151 10/27/20 0933 10/28/20 0118 10/29/20 0312  K 4.1 3.9 4.2 4.6 4.9 4.9 4.8 4.3  MG 1.4* 1.7 1.9  --  1.8  --   --  1.6*  PHOS  --   --   --   --  2.0*  --  1.3* 3.0     Thrombocytopenia: -Improving Last Labs            Recent Labs  Lab 10/23/20 0058 10/24/20 0224 10/25/20 0325 10/26/20 0240 10/27/20 0151 10/28/20 0118 10/29/20 0312  PLT 67* 85* 101* 119* 127* 149* 162     Hypothyroidism:  Continue with Synthroid.  Moderate malnutrition -Related to acute illness.   Continue to increase oral protein calorie  intake.  Resting tremors of hands Unclear etiology. Obtain neurology referral from your primary care provider.    Code Status:   Full Code  Nutritional status: Body mass index is 30.66 kg/m. Nutrition Problem: Moderate Malnutrition Etiology: acute illness Signs/Symptoms: mild muscle depletion,percent weight loss    Diet Order                  Diet regular Room service appropriate? Yes; Fluid consistency: Thin  Diet effective now                   Antimicrobials: None Fluid: No IV fluid Consultants:  Nephrology, cardiology, GI, orthopedic surgery.     Discharge Exam: BP 129/74 (BP Location: Left Arm)   Pulse 80   Temp 98.5 F (36.9 C)   Resp 20   Ht '5\' 7"'  (1.702 m)   Wt 88.8 kg   SpO2 94%   BMI 30.66 kg/m  . General: 86 y.o. year-old female well developed well nourished in no acute distress.  Alert and oriented x3. . Cardiovascular: Regular rate and rhythm with no rubs or gallops.  No thyromegaly or JVD noted.   Marland Kitchen Respiratory: Clear to auscultation with no wheezes or rales. Good inspiratory effort. . Abdomen: Soft nontender nondistended with normal bowel sounds x4 quadrants. . Musculoskeletal: No lower extremity edema bilaterally.   Marland Kitchen Psychiatry: Mood is appropriate for condition and setting  Discharge Instructions You were cared for by a hospitalist during your hospital stay. If you have any questions about your discharge medications or the care you received while you were in the hospital after you are discharged, you can call the unit and asked to speak with the hospitalist on call if the hospitalist that took care of  you is not available. Once you are discharged, your primary care physician will handle any further medical issues. Please note that NO REFILLS for any discharge medications will be authorized once you are discharged, as it is imperative that you return to your primary care physician (or establish a relationship with a primary care physician  if you do not have one) for your aftercare needs so that they can reassess your need for medications and monitor your lab values.   Allergies as of 10/30/2020      Reactions   Meloxicam Other (See Comments)   Caused an injury to the kidneys, per nephrologist   Micardis Hct [telmisartan-hctz] Other (See Comments)   Caused an injury to the kidneys, per nephrologist      Medication List    STOP taking these medications   metoprolol succinate 50 MG 24 hr tablet Commonly known as: TOPROL-XL     TAKE these medications   albuterol 90 MCG/ACT inhaler Commonly known as: PROVENTIL,VENTOLIN Inhale 2 puffs into the lungs every 6 (six) hours as needed for wheezing or shortness of breath.   allopurinol 100 MG tablet Commonly known as: ZYLOPRIM Take 2 tablets (200 mg total) by mouth daily. Start taking on: October 31, 2020   amoxicillin 500 MG capsule Commonly known as: AMOXIL Take 2 capsules (1,000 mg total) by mouth every 12 (twelve) hours for 8 days.   clarithromycin 500 MG tablet Commonly known as: BIAXIN Take 1 tablet (500 mg total) by mouth every 12 (twelve) hours for 8 days.   clobetasol ointment 0.05 % Commonly known as: TEMOVATE Apply 1 application topically as needed (for irritation).   ferrous sulfate 325 (65 FE) MG tablet Take 325 mg by mouth daily with breakfast.   furosemide 20 MG tablet Commonly known as: LASIX Take 0.5 tablets (10 mg total) by mouth daily. Start taking on: October 31, 2020   Fusion Plus Caps Take 1 capsule by mouth daily.   levothyroxine 100 MCG tablet Commonly known as: SYNTHROID Take 1 tablet (100 mcg total) by mouth daily at 6 (six) AM. Start taking on: October 31, 2020 What changed:   medication strength  how much to take  when to take this   metoprolol tartrate 25 MG tablet Commonly known as: LOPRESSOR Take 0.5 tablets (12.5 mg total) by mouth 2 (two) times daily.   metroNIDAZOLE 500 MG tablet Commonly known as:  FLAGYL Take 1 tablet (500 mg total) by mouth every 12 (twelve) hours for 8 days.   multivitamin with minerals Tabs tablet Take 1 tablet by mouth daily.   pantoprazole 40 MG tablet Commonly known as: PROTONIX Take 1 tablet (40 mg total) by mouth 2 (two) times daily for 7 days.   pantoprazole 40 MG tablet Commonly known as: PROTONIX Take 1 tablet (40 mg total) by mouth daily. Start taking on: November 06, 2020   polyethylene glycol 17 g packet Commonly known as: MIRALAX / GLYCOLAX Take 17 g by mouth daily as needed for moderate constipation.   Trelegy Ellipta 100-62.5-25 MCG/INH Aepb Generic drug: Fluticasone-Umeclidin-Vilant Inhale 1 puff into the lungs daily.      Allergies  Allergen Reactions  . Meloxicam Other (See Comments)    Caused an injury to the kidneys, per nephrologist  . Micardis Hct [Telmisartan-Hctz] Other (See Comments)    Caused an injury to the kidneys, per nephrologist    Follow-up Information    DR NAJEEB [facility]. Call.   Why: call office as needed for gastrointestinal questions or  concerns.   Contact information: [address] [place] [address]       Celene Squibb, MD. Call in 1 day(s).   Specialty: Internal Medicine Why: Please call for a post hospital follow up appointment Contact information: [facility] [address] [phone]        O'Neal, Cassie Freer, MD. Call in 1 day(s).   Specialties: Internal Medicine, Cardiology, Radiology Why: Please call for a post hospital follow-up appointment. Contact information: [place] [address] [phone]        Mcarthur Rossetti, MD. Call in 1 day(s).   Specialty: Orthopedic Surgery Why: Please call for a post hospital follow-up appointment. Contact information: [address] [place] [address] [phone]                The results of significant diagnostics from this hospitalization (including  imaging, microbiology, ancillary and laboratory) are listed below for reference.    Significant Diagnostic Studies: DG Chest 2 View  Result Date: 10/26/2020 CLINICAL DATA:  Shortness of breath EXAM: CHEST - 2 VIEW COMPARISON:  10/26/2020, 10:59 a.m. FINDINGS: Mild cardiomegaly. Unchanged small bilateral pleural effusions. The visualized skeletal structures are unremarkable. IMPRESSION: Mild cardiomegaly with unchanged small bilateral pleural effusions. No new airspace opacity. Electronically Signed   By: Eddie Candle M.D.   On: 10/26/2020 12:23   DG Chest 2 View  Result Date: 10/21/2020 CLINICAL DATA:  Weakness EXAM: CHEST - 2 VIEW COMPARISON:  05/31/2017 FINDINGS: Cardiac shadow is stable. Aortic calcifications are noted. The lungs are well aerated bilaterally. No focal infiltrate or sizable effusion is seen. No bony abnormality is noted. IMPRESSION: No acute abnormality noted. Electronically Signed   By: Inez Catalina M.D.   On: 10/21/2020 13:59   DG Knee 1-2 Views Left  Result Date: 10/25/2020 CLINICAL DATA:  Knee pain, no known injury, initial encounter EXAM: LEFT KNEE - 2 VIEW COMPARISON:  None. FINDINGS: Tricompartmental degenerative changes are noted. Moderate joint effusion is noted similar to that seen on the right. No acute fracture or dislocation is noted. IMPRESSION: Degenerative change with joint effusion similar to that seen on the right side. Electronically Signed   By: Inez Catalina M.D.   On: 10/25/2020 13:01   DG Knee 1-2 Views Right  Result Date: 10/25/2020 CLINICAL DATA:  Bilateral knee pain EXAM: RIGHT KNEE - 2 VIEW COMPARISON:  None. FINDINGS: Tricompartmental degenerative changes are noted with lateral joint space narrowing. Moderate joint effusion is seen. No other soft tissue abnormality is seen. IMPRESSION: Degenerative change with joint effusion. Electronically Signed   By: Inez Catalina M.D.   On: 10/25/2020 13:00   DG Abd 1 View  Result Date: 10/26/2020 CLINICAL  DATA:  Acute renal failure, abdominal distension EXAM: ABDOMEN - 1 VIEW COMPARISON:  CT abdomen and pelvis 05/31/2017 FINDINGS: Stool throughout colon, question containing retained contrast. Nonobstructive bowel gas pattern. No bowel dilatation or bowel wall thickening. Bones demineralized. Aneurysmal dilatation of the abdominal aorta, though unable to accurately assess size radiographically; patient has known abdominal aortic aneurysm and aortic tortuosity by prior CT. No definite urinary tract calcification. IMPRESSION: Nonobstructive bowel gas pattern. Aneurysmal dilatation of distal abdominal aorta though unable to at accurately assess size radiographically; patient has known abdominal aortic aneurysm by prior CT exam. Recommend ultrasound or CT assessment to assess interval change. Findings called to Dr. Clearence Ped on 10/26/2020 at 1905 hrs. Electronically Signed   By: Crist Infante.D.  On: 10/26/2020 19:05   CT Head Wo Contrast  Result Date: 10/21/2020 CLINICAL DATA:  Altered level of consciousness EXAM: CT HEAD WITHOUT CONTRAST TECHNIQUE: Contiguous axial images were obtained from the base of the skull through the vertex without intravenous contrast. COMPARISON:  06/04/2005 FINDINGS: Brain: There are confluent hypodensities throughout the periventricular white matter, more pronounced since prior study, consistent with age-indeterminate small vessel ischemic changes. These are likely chronic. No other signs of acute infarct or hemorrhage. Lateral ventricles and remaining midline structures are unremarkable. No acute extra-axial fluid collections. No mass effect. Vascular: No hyperdense vessel or unexpected calcification. Skull: Normal. Negative for fracture or focal lesion. Sinuses/Orbits: Minimal mucosal thickening within the sphenoid sinus. Remaining sinuses are clear. Other: None. IMPRESSION: 1. Diffuse hypodensities throughout the periventricular white matter, likely representing chronic small  vessel ischemic changes. 2. Otherwise no acute intracranial process. Electronically Signed   By: Randa Ngo M.D.   On: 10/21/2020 15:01   CT CHEST ABDOMEN PELVIS W CONTRAST  Result Date: 10/24/2020 CLINICAL DATA:  Poor appetite, personal history of breast cancer and lung cancer EXAM: CT CHEST, ABDOMEN, AND PELVIS WITH CONTRAST TECHNIQUE: Multidetector CT imaging of the chest, abdomen and pelvis was performed following the standard protocol during bolus administration of intravenous contrast. CONTRAST:  146m OMNIPAQUE IOHEXOL 300 MG/ML  SOLN COMPARISON:  06/13/2019 FINDINGS: CT CHEST FINDINGS Cardiovascular: The heart is mildly enlarged without pericardial effusion. Extensive atherosclerosis throughout the coronary vasculature. The aorta is normal in caliber. There is extensive atherosclerosis of the aortic arch, with moderate stenosis of the origin of the left subclavian artery. Mediastinum/Nodes: Stable subcentimeter left hilar lymph nodes. No pathologic adenopathy. Thyroid, trachea, and esophagus are unremarkable. Lungs/Pleura: Stable postsurgical changes from partial left upper lobe resection. Upper lobe predominant emphysema. Bibasilar scarring. No airspace disease, effusion, or pneumothorax. Musculoskeletal: No acute or destructive bony lesions. Reconstructed images demonstrate no additional findings. CT ABDOMEN PELVIS FINDINGS Hepatobiliary: No focal liver abnormality is seen. No gallstones, gallbladder wall thickening, or biliary dilatation. Pancreas: Unremarkable. No pancreatic ductal dilatation or surrounding inflammatory changes. Spleen: Normal in size without focal abnormality. Adrenals/Urinary Tract: Adrenal glands are unremarkable. Kidneys are normal, without renal calculi, focal lesion, or hydronephrosis. Bladder is unremarkable. Stomach/Bowel: No bowel obstruction or ileus. Diverticulosis of the sigmoid colon without diverticulitis. Normal appendix right lower quadrant. No bowel wall  thickening or inflammatory change. Vascular/Lymphatic: Infrarenal abdominal aortic aneurysm measures up to 4.8 cm in diameter, previously having measured 4.1 cm on 05/31/2017 exam. Significant atherosclerosis and mural thrombus throughout the aorta. No pathologic adenopathy. Reproductive: Uterus and bilateral adnexa are unremarkable. Other: No free fluid or free gas. No abdominal wall hernia. Musculoskeletal: No acute or destructive bony lesions. Reconstructed images demonstrate no additional findings. IMPRESSION: 1. Stable postsurgical changes from partial left upper lobe resection. 2. Infrarenal abdominal aortic aneurysm measures up to 4.8 cm in diameter, previously having measured 4.1 cm on 05/31/2017 exam. Recommend follow-up CT/MR every 6 months and vascular consultation. This recommendation follows ACR consensus guidelines: White Paper of the ACR Incidental Findings Committee II on Vascular Findings. J Am Coll Radiol 2013; 10:789-794. 3. Diverticulosis without diverticulitis. 4. Aortic Atherosclerosis (ICD10-I70.0) and Emphysema (ICD10-J43.9). Electronically Signed   By: MRanda NgoM.D.   On: 10/24/2020 18:00   DG CHEST PORT 1 VIEW  Result Date: 10/26/2020 CLINICAL DATA:  Shortness of breath. EXAM: PORTABLE CHEST 1 VIEW COMPARISON:  10/21/2020 FINDINGS: Lordotic technique is demonstrated. Lungs are adequately inflated with hazy density over the left base/retrocardiac region which  may be due to atelectasis/effusion versus prominent overlying soft tissues. Borderline stable cardiomegaly. Remainder of the exam is unchanged. IMPRESSION: Hazy density over the left base/retrocardiac region which may be due to atelectasis/effusion versus prominent overlying soft tissues. Consider PA and lateral chest x-ray for better evaluation of the left base. Electronically Signed   By: Marin Olp M.D.   On: 10/26/2020 11:05   ECHOCARDIOGRAM COMPLETE  Result Date: 10/22/2020    ECHOCARDIOGRAM REPORT   Patient  Name:   Shaquanna Newbern Date of Exam: 10/22/2020 Medical Rec #:  [id number]      Height:       67.0 in Accession #:    [id number]     Weight:       191.0 lb Date of Birth:  03-Oct-1934      BSA:          1.983 m Patient Age:    83 years       BP:           114/58 mmHg Patient Gender: F              HR:           61 bpm. Exam Location:  Inpatient Procedure: 2D Echo, Cardiac Doppler and Color Doppler Indications:    Atrial fibrillation  History:        Patient has no prior history of Echocardiogram examinations.                 Arrythmias:Atrial Fibrillation; Risk Factors:Hypertension.                 Breast and lung cancer, Renal failure.  Sonographer:    Dustin Flock Referring Phys: [place]  1. Left ventricular ejection fraction, by estimation, is 65 to 70%. The left ventricle has normal function. The left ventricle has no regional wall motion abnormalities. There is mild left ventricular hypertrophy. Left ventricular diastolic parameters are consistent with Grade I diastolic dysfunction (impaired relaxation).  2. Prominent moderator band. Right ventricular systolic function is hyperdynamic. The right ventricular size is normal. There is moderately elevated pulmonary artery systolic pressure.  3. Left atrial size was moderately dilated.  4. The mitral valve is grossly normal. Trivial mitral valve regurgitation.  5. The aortic valve is tricuspid. Aortic valve regurgitation is not visualized. Mild aortic valve sclerosis is present, with no evidence of aortic valve stenosis.  6. The inferior vena cava is normal in size with greater than 50% respiratory variability, suggesting right atrial pressure of 3 mmHg. FINDINGS  Left Ventricle: Left ventricular ejection fraction, by estimation, is 65 to 70%. The left ventricle has normal function. The left ventricle has no regional wall motion abnormalities. The left ventricular internal cavity size was normal in size. There is  mild left ventricular  hypertrophy. Left ventricular diastolic parameters are consistent with Grade I diastolic dysfunction (impaired relaxation). Indeterminate filling pressures. Right Ventricle: Prominent moderator band. The right ventricular size is normal. No increase in right ventricular wall thickness. Right ventricular systolic function is hyperdynamic. There is moderately elevated pulmonary artery systolic pressure. The tricuspid regurgitant velocity is 3.44 m/s, and with an assumed right atrial pressure of 3 mmHg, the estimated right ventricular systolic pressure is 32.6 mmHg. Left Atrium: Left atrial size was moderately dilated. Right Atrium: Right atrial size was normal in size. Pericardium: There is no evidence of pericardial effusion. Mitral Valve: The mitral valve is grossly normal. Trivial mitral valve regurgitation. Tricuspid Valve: The tricuspid valve is  grossly normal. Tricuspid valve regurgitation is trivial. Aortic Valve: The aortic valve is tricuspid. Aortic valve regurgitation is not visualized. Mild aortic valve sclerosis is present, with no evidence of aortic valve stenosis. Aortic valve peak gradient measures 15.1 mmHg. Pulmonic Valve: The pulmonic valve was grossly normal. Pulmonic valve regurgitation is trivial. Aorta: The aortic root and ascending aorta are structurally normal, with no evidence of dilitation. Venous: The inferior vena cava is normal in size with greater than 50% respiratory variability, suggesting right atrial pressure of 3 mmHg. IAS/Shunts: No atrial level shunt detected by color flow Doppler.  LEFT VENTRICLE PLAX 2D LVIDd:         3.40 cm  Diastology LVIDs:         1.80 cm  LV e' medial:    5.66 cm/s LV PW:         1.10 cm  LV E/e' medial:  13.9 LV IVS:        1.20 cm  LV e' lateral:   8.27 cm/s LVOT diam:     2.10 cm  LV E/e' lateral: 9.5 LV SV:         64 LV SV Index:   32 LVOT Area:     3.46 cm  RIGHT VENTRICLE RV Basal diam:  3.00 cm RV S prime:     13.70 cm/s TAPSE (M-mode): 2.4 cm LEFT  ATRIUM             Index       RIGHT ATRIUM           Index LA diam:        3.30 cm 1.66 cm/m  RA Area:     19.70 cm LA Vol (A2C):   92.9 ml 46.85 ml/m RA Volume:   57.50 ml  29.00 ml/m LA Vol (A4C):   81.6 ml 41.15 ml/m LA Biplane Vol: 89.9 ml 45.33 ml/m  AORTIC VALVE AV Area (Vmax): 1.56 cm AV Vmax:        194.00 cm/s AV Peak Grad:   15.1 mmHg LVOT Vmax:      87.60 cm/s LVOT Vmean:     57.100 cm/s LVOT VTI:       0.186 m  AORTA Ao Root diam: 2.60 cm MITRAL VALVE               TRICUSPID VALVE MV Area (PHT): 3.37 cm    TR Peak grad:   47.3 mmHg MV Decel Time: 225 msec    TR Vmax:        344.00 cm/s MV E velocity: 78.50 cm/s MV A velocity: 52.80 cm/s  SHUNTS MV E/A ratio:  1.49        Systemic VTI:  0.19 m                            Systemic Diam: 2.10 cm Lyman Bishop MD Electronically signed by Lyman Bishop MD Signature Date/Time: 10/22/2020/4:36:05 PM    Final    VAS [place] LOWER EXTREMITY VENOUS (DVT)  Result Date: 10/23/2020  Lower Venous DVT Study Other Indications: BLE calf tenderness. Risk Factors: Hx of Cancer. Performing Technologist: Rogelia Rohrer  Examination Guidelines: A complete evaluation includes B-mode imaging, spectral Doppler, color Doppler, and power Doppler as needed of all accessible portions of each vessel. Bilateral testing is considered an integral part of a complete examination. Limited examinations for reoccurring indications may be performed as noted. The reflux portion of the exam is  performed with the patient in reverse Trendelenburg.  +---------+---------------+---------+-----------+----------+--------------+ RIGHT    CompressibilityPhasicitySpontaneityPropertiesThrombus Aging +---------+---------------+---------+-----------+----------+--------------+ CFV      Full           Yes      Yes                                 +---------+---------------+---------+-----------+----------+--------------+ SFJ      Full                                                         +---------+---------------+---------+-----------+----------+--------------+ FV Prox  Full           Yes      Yes                                 +---------+---------------+---------+-----------+----------+--------------+ FV Mid   Full           Yes      Yes                                 +---------+---------------+---------+-----------+----------+--------------+ FV DistalFull           Yes      Yes                                 +---------+---------------+---------+-----------+----------+--------------+ PFV      Full                                                        +---------+---------------+---------+-----------+----------+--------------+ POP      Full           Yes      Yes                                 +---------+---------------+---------+-----------+----------+--------------+ PTV      Full                                                        +---------+---------------+---------+-----------+----------+--------------+ PERO     Full                                                        +---------+---------------+---------+-----------+----------+--------------+   +---------+---------------+---------+-----------+----------+--------------+ LEFT     CompressibilityPhasicitySpontaneityPropertiesThrombus Aging +---------+---------------+---------+-----------+----------+--------------+ CFV      Full           Yes      Yes                                 +---------+---------------+---------+-----------+----------+--------------+  SFJ      Full                                                        +---------+---------------+---------+-----------+----------+--------------+ FV Prox  Full           Yes      Yes                                 +---------+---------------+---------+-----------+----------+--------------+ FV Mid   Full           Yes      Yes                                  +---------+---------------+---------+-----------+----------+--------------+ FV DistalFull           Yes      Yes                                 +---------+---------------+---------+-----------+----------+--------------+ PFV      Full                                                        +---------+---------------+---------+-----------+----------+--------------+ POP      Full           Yes      Yes                                 +---------+---------------+---------+-----------+----------+--------------+ PTV      Full                                                        +---------+---------------+---------+-----------+----------+--------------+ PERO     Full                                                        +---------+---------------+---------+-----------+----------+--------------+     Summary: RIGHT: - There is no evidence of deep vein thrombosis in the lower extremity.  - A cystic structure is found in the popliteal fossa.   *See table(s) above for measurements and observations. Electronically signed by Curt Jews MD on 10/23/2020 at 8:02:53 PM.    Final     Microbiology: Recent Results (from the past 240 hour(s))  Resp Panel by RT-PCR (Flu A&B, Covid) Nasopharyngeal Swab     Status: None   Collection Time: 10/21/20  1:04 PM   Specimen: Nasopharyngeal Swab; Nasopharyngeal(NP) swabs in vial transport medium  Result Value Ref Range Status   SARS Coronavirus 2 by RT PCR NEGATIVE NEGATIVE Final    Comment: (NOTE) SARS-CoV-2 target nucleic acids are NOT DETECTED.  The SARS-CoV-2 RNA is generally detectable in upper respiratory specimens during the acute phase of infection. The lowest concentration of SARS-CoV-2 viral copies this assay can detect is 138 copies/mL. A negative result does not preclude SARS-Cov-2 infection and should not be used as the sole basis for treatment or other patient management decisions. A negative result may occur with  improper  specimen collection/handling, submission of specimen other than nasopharyngeal swab, presence of viral mutation(s) within the areas targeted by this assay, and inadequate number of viral copies(<138 copies/mL). A negative result must be combined with clinical observations, patient history, and epidemiological information. The expected result is Negative.  Fact Sheet for Patients:  EntrepreneurPulse.com.au  Fact Sheet for Healthcare Providers:  IncredibleEmployment.be  This test is no t yet approved or cleared by the [place] FDA and  has been authorized for detection and/or diagnosis of SARS-CoV-2 by FDA under an Emergency Use Authorization (EUA). This EUA will remain  in effect (meaning this test can be used) for the duration of the COVID-19 declaration under Section 564(b)(1) of the Act, 21 U.S.C.section 360bbb-3(b)(1), unless the authorization is terminated  or revoked sooner.       Influenza A by PCR NEGATIVE NEGATIVE Final   Influenza B by PCR NEGATIVE NEGATIVE Final    Comment: (NOTE) The Xpert Xpress SARS-CoV-2/FLU/RSV plus assay is intended as an aid in the diagnosis of influenza from Nasopharyngeal swab specimens and should not be used as a sole basis for treatment. Nasal washings and aspirates are unacceptable for Xpert Xpress SARS-CoV-2/FLU/RSV testing.  Fact Sheet for Patients: EntrepreneurPulse.com.au  Fact Sheet for Healthcare Providers: IncredibleEmployment.be  This test is not yet approved or cleared by the [place] FDA and has been authorized for detection and/or diagnosis of SARS-CoV-2 by FDA under an Emergency Use Authorization (EUA). This EUA will remain in effect (meaning this test can be used) for the duration of the COVID-19 declaration under Section 564(b)(1) of the Act, 21 U.S.C. section 360bbb-3(b)(1), unless the authorization is terminated or revoked.  Performed at  [facility], [place] [address], [place], [place] [address]   Urine culture     Status: None   Collection Time: 10/21/20  2:36 PM   Specimen: Urine, Random  Result Value Ref Range Status   Specimen Description URINE, RANDOM  Final   Special Requests NONE  Final   Culture   Final    NO GROWTH Performed at [facility], [place] [address] [place], [place] [address]    Report Status 10/22/2020 FINAL  Final  Gram stain     Status: None   Collection Time: 10/26/20  9:10 AM   Specimen: KNEE; Body Fluid  Result Value Ref Range Status   Specimen Description KNEE  Final   Special Requests FLUID  Final   Gram Stain   Final    WBC PRESENT,BOTH PMN AND MONONUCLEAR NO ORGANISMS SEEN CYTOSPIN SMEAR Performed at [facility], [place] [address] [place], [place] [address]    Report Status 10/26/2020 FINAL  Final     Labs: Basic Metabolic Panel: Recent Labs  Lab 10/25/20 0325 10/26/20 0240 10/26/20 1350 10/27/20 0151 10/27/20 0933 10/28/20 0118 10/29/20 0312 10/30/20 0303  NA 121* 118*   < > 119* 124* 128* 130* 132*  K 3.9 4.2   < > 4.9 4.9 4.8 4.3 4.1  CL 90* 89*   < > 89* 94* 97* 99 99  CO2 22 22   < > 21* 21*  '23 22 23  ' GLUCOSE 99 119*   < > 89 87 135* 119* 110*  BUN 23 33*   < > 36* 31* 28* 23 22  CREATININE 1.35* 1.74*   < > 1.62* 1.39* 1.24* 1.13* 1.34*  CALCIUM 8.1* 7.7*   < > 7.5* 7.9* 8.4* 8.5* 8.9  MG 1.7 1.9  --  1.8  --   --  1.6* 1.6*  PHOS  --   --   --  2.0*  --  1.3* 3.0 4.0   < > = values in this interval not displayed.   Liver Function Tests: Recent Labs  Lab 10/27/20 0151 10/27/20 0933 10/28/20 0118 10/29/20 0312 10/30/20 0303  AST  --  23  --   --   --   ALT  --  16  --   --   --   ALKPHOS  --  44  --   --   --   BILITOT  --  0.7  --   --   --   PROT  --  6.2*  --   --   --   ALBUMIN 2.0* 2.2* 2.2* 2.5* 2.7*   No results for input(s): LIPASE, AMYLASE in the last 168 hours. No results for input(s): AMMONIA in the last 168  hours. CBC: Recent Labs  Lab 10/25/20 0325 10/26/20 0240 10/27/20 0151 10/28/20 0118 10/29/20 0312  WBC 9.5 6.5 4.1 2.0* 4.1  NEUTROABS 1.5* 1.6* 0.7* 0.9* 1.9  HGB 8.1* 7.9* 7.9* 8.4* 8.4*  HCT 25.9* 25.1* 25.1* 28.3* 27.8*  MCV 94.9 94.4 94.0 95.9 95.2  PLT 101* 119* 127* 149* 162   Cardiac Enzymes: No results for input(s): CKTOTAL, CKMB, CKMBINDEX, TROPONINI in the last 168 hours. BNP: BNP (last 3 results) No results for input(s): BNP in the last 8760 hours.  ProBNP (last 3 results) No results for input(s): PROBNP in the last 8760 hours.  CBG: No results for input(s): GLUCAP in the last 168 hours.     Signed:  Kayleen Memos, MD [facility] 10/30/2020, 12:59 PM

## 2020-10-31 ENCOUNTER — Inpatient Hospital Stay (HOSPITAL_COMMUNITY): Payer: Medicare Other

## 2020-10-31 ENCOUNTER — Inpatient Hospital Stay (HOSPITAL_COMMUNITY): Payer: Medicare Other | Admitting: Occupational Therapy

## 2020-10-31 DIAGNOSIS — R5381 Other malaise: Principal | ICD-10-CM

## 2020-10-31 LAB — COMPREHENSIVE METABOLIC PANEL
ALT: 44 U/L (ref 0–44)
AST: 40 U/L (ref 15–41)
Albumin: 2.5 g/dL — ABNORMAL LOW (ref 3.5–5.0)
Alkaline Phosphatase: 49 U/L (ref 38–126)
Anion gap: 9 (ref 5–15)
BUN: 16 mg/dL (ref 8–23)
CO2: 26 mmol/L (ref 22–32)
Calcium: 9 mg/dL (ref 8.9–10.3)
Chloride: 99 mmol/L (ref 98–111)
Creatinine, Ser: 1.03 mg/dL — ABNORMAL HIGH (ref 0.44–1.00)
GFR, Estimated: 54 mL/min — ABNORMAL LOW (ref >60)
Glucose, Bld: 106 mg/dL — ABNORMAL HIGH (ref 70–99)
Potassium: 4.3 mmol/L (ref 3.5–5.1)
Sodium: 134 mmol/L — ABNORMAL LOW (ref 135–145)
Total Bilirubin: 0.7 mg/dL (ref 0.3–1.2)
Total Protein: 6.6 g/dL (ref 6.5–8.1)

## 2020-10-31 LAB — CBC WITH DIFFERENTIAL/PLATELET
Abs Immature Granulocytes: 0.77 10*3/uL — ABNORMAL HIGH (ref 0.00–0.07)
Basophils Absolute: 0 10*3/uL (ref 0.0–0.1)
Basophils Relative: 1 %
Eosinophils Absolute: 0 10*3/uL (ref 0.0–0.5)
Eosinophils Relative: 0 %
HCT: 27.2 % — ABNORMAL LOW (ref 36.0–46.0)
Hemoglobin: 8 g/dL — ABNORMAL LOW (ref 12.0–15.0)
Immature Granulocytes: 14 %
Lymphocytes Relative: 27 %
Lymphs Abs: 1.5 10*3/uL (ref 0.7–4.0)
MCH: 28.7 pg (ref 26.0–34.0)
MCHC: 29.4 g/dL — ABNORMAL LOW (ref 30.0–36.0)
MCV: 97.5 fL (ref 80.0–100.0)
Monocytes Absolute: 1.9 10*3/uL — ABNORMAL HIGH (ref 0.1–1.0)
Monocytes Relative: 32 %
Neutro Abs: 1.5 10*3/uL — ABNORMAL LOW (ref 1.7–7.7)
Neutrophils Relative %: 26 %
Platelets: 156 10*3/uL (ref 150–400)
RBC: 2.79 MIL/uL — ABNORMAL LOW (ref 3.87–5.11)
RDW: 16.2 % — ABNORMAL HIGH (ref 11.5–15.5)
WBC: 5.7 10*3/uL (ref 4.0–10.5)
nRBC: 3.3 % — ABNORMAL HIGH (ref 0.0–0.2)

## 2020-10-31 MED ORDER — TRAMADOL HCL 50 MG PO TABS
50.0000 mg | ORAL_TABLET | Freq: Four times a day (QID) | ORAL | Status: DC | PRN
  Administered 2020-10-31 – 2020-11-02 (×4): 50 mg via ORAL
  Filled 2020-10-31 (×5): qty 1

## 2020-10-31 MED ORDER — AMOXICILLIN 250 MG PO CAPS
1000.0000 mg | ORAL_CAPSULE | Freq: Two times a day (BID) | ORAL | Status: AC
  Administered 2020-10-31 – 2020-11-06 (×13): 1000 mg via ORAL
  Filled 2020-10-31 (×14): qty 4

## 2020-10-31 MED ORDER — METRONIDAZOLE 500 MG PO TABS
500.0000 mg | ORAL_TABLET | Freq: Two times a day (BID) | ORAL | Status: AC
  Administered 2020-10-31 – 2020-11-06 (×13): 500 mg via ORAL
  Filled 2020-10-31 (×14): qty 1

## 2020-10-31 MED ORDER — DICLOFENAC SODIUM 1 % EX GEL
4.0000 g | Freq: Four times a day (QID) | CUTANEOUS | Status: DC
  Administered 2020-10-31 – 2020-11-12 (×48): 4 g via TOPICAL
  Filled 2020-10-31: qty 100

## 2020-10-31 NOTE — Progress Notes (Signed)
Inpatient Rehabilitation Care Coordinator Assessment and Plan Patient Details  Name: Lisa [name] MRN: [id number] Date of Birth: October 16, 1934  Today's Date: 10/31/2020  Hospital Problems: Principal Problem:   Debility  Past Medical History:  Past Medical History:  Diagnosis Date  . Adenocarcinoma of left lung ([facility]) 2006  . Arthritis   . Asthma   . Cancer of breast, female ([facility])   . Cancer of lung ([facility])   . Hypertension   . Hypothyroidism   . Invasive ductal carcinoma of left breast ([facility]) 1999  . Neutropenia ([facility]) 06/09/2016  . Personal history of radiation therapy    Past Surgical History:  Past Surgical History:  Procedure Laterality Date  . BIOPSY  10/23/2020   Procedure: BIOPSY;  Surgeon: Yetta Flock, MD;  Location: [facility] ENDOSCOPY;  Service: Gastroenterology;;  . BREAST BIOPSY     left axillary node dissection  . BREAST LUMPECTOMY Left   . COLONOSCOPY  03/08/2003   XTK:[id number] rectal polyps destroyed with the tip of the snare/Polyps at hepatic flexure, splenic flexure at 35 cm/Left-sided diverticula: unable to retrieve path  . COLONOSCOPY  09/12/2008   JME:QASTMH rectum and distal sigmoid diminutive polyps/scattered left sided diverticulum. hyperplastic  . COLONOSCOPY N/A 03/06/2013   DQQ:IWLNLGX polyp-removed as described above; colonic diverticulosis. hyperplastic polyps. next TCS 03/2018  . ESOPHAGOGASTRODUODENOSCOPY (EGD) WITH PROPOFOL N/A 10/23/2020   Procedure: ESOPHAGOGASTRODUODENOSCOPY (EGD) WITH PROPOFOL;  Surgeon: Yetta Flock, MD;  Location: [facility];  Service: Gastroenterology;  Laterality: N/A;  . FOOT SURGERY    . LUNG REMOVAL, PARTIAL     upper lobe   Social History:  reports that she quit smoking about 29 years ago. She has a 26.25 pack-year smoking history. She has never used smokeless tobacco. She reports that she does not drink alcohol and does not use drugs.  Family / Support Systems Marital Status: Widow/Widower Patient Roles:  Parent Children: Lisa [name]-daughter [phone] Other Supports: Lisa [name]-Lisa [name] [phone]  Son in [place] who will come and stay for one month at discharge Anticipated Caregiver: Daughter and son from [facility] Ability/Limitations of Caregiver: Daughter works third shift and son planing on coming for a short time to assist Lisa [name] here from [place] and plans to go back Caregiver Availability: Other (Comment) (short term someone there-daughter there during the day) Family Dynamics: Close with her children and extended family, pt has been so independent all of her life and the caregivers of others. She is not used to being in the patient role.  Social History Preferred language: English Religion: Baptist Cultural Background: No issues Education: HS Read: Yes Write: Yes Employment Status: Retired Public relations account executive Issues: No issues Guardian/Conservator: No issues-MD feels pt is capable of making her own decisions while here   Abuse/Neglect Abuse/Neglect Assessment Can Be Completed: Yes Physical Abuse: Denies Verbal Abuse: Denies Sexual Abuse: Denies Exploitation of patient/patient's resources: Denies Self-Neglect: Denies  Emotional Status Pt's affect, behavior and adjustment status: Pt is motivated to get better and be independent again. She has always been independent and does not like asking others for assist. She prides herself on this and would rathr do herself than ask for help Recent Psychosocial Issues: other health issues were managed prior to admission up unitl three weeks ago when she declined in function Psychiatric History: No issues-deferred depression screen due to pt coping appropriately and seems quite happy and doing well Substance Abuse History: No issues  Patient / Family Perceptions, Expectations & Goals Pt/Family understanding of illness & functional limitations: Pt and Lisa [name] have  a good understanding of her condition. Lisa [name] is a MD and made her  come to the hospital. Pt was being stubborn and did not realize how sick she was. Both are pleased with her progress now Premorbid pt/family roles/activities: mom, grandmother, retiree, church member, etc Anticipated changes in roles/activities/participation: resume Pt/family expectations/goals: Pt states: " I want to do for myself, I don;t like asking others for help."  Lisa [name] states: " I want to make sure she has what she needs, she is a special lady to [place] all."  [employer]: None Premorbid Home Care/DME Agencies: Other (Comment) (cane and shower chair) Transportation available at discharge: Family pt was driving prior to admission  Discharge Planning Living Arrangements: Children Support Systems: Children,Other relatives,Friends/neighbors,Church/faith community Type of Residence: Private residence Insurance Resources: Multimedia programmer (specify),Medicare Sports administrator) Financial Resources: Social [place] Referred: No Living Expenses: Own Money Management: Patient,Family Does the patient have any problems obtaining your medications?: No Home Management: Both pt and daughter Patient/Family Preliminary Plans: Return home with daughter who is there during the day and works at night and son suppose to come form [place] to stay with one month in case care is needed. Will await team evaluations and work on best plan for pt Care Coordinator Anticipated Follow Up Needs: [place] female who is motivated and ready to do therapies. She wants to get back to her independent level. Her family is involved and supportive. Will have care for a short time when discharges home from son. Pt should do well here and make good progress. Will await therapy evaluations and work on safe discharge plan.  Lisa [name] 10/31/2020, 1:00 PM

## 2020-10-31 NOTE — Plan of Care (Signed)
  Problem: Consults Goal: RH GENERAL PATIENT EDUCATION Description: See Patient Education module for education specifics. Outcome: Progressing   Problem: RH BOWEL ELIMINATION Goal: RH STG MANAGE BOWEL WITH ASSISTANCE Description: STG Manage Bowel with supervision Assistance. Outcome: Progressing Goal: RH STG MANAGE BOWEL W/MEDICATION W/ASSISTANCE Description: STG Manage Bowel with Medication with supervision Assistance. Outcome: Progressing   Problem: RH BLADDER ELIMINATION Goal: RH STG MANAGE BLADDER WITH ASSISTANCE Description: STG Manage Bladder With Assistance Outcome: Progressing   Problem: RH SKIN INTEGRITY Goal: RH STG MAINTAIN SKIN INTEGRITY WITH ASSISTANCE Description: STG Maintain Skin Integrity With supervision Assistance. Outcome: Progressing Goal: RH STG ABLE TO PERFORM INCISION/WOUND CARE W/ASSISTANCE Description: STG Able To Perform Incision/Wound Care With supervision Assistance. Outcome: Progressing   Problem: RH SAFETY Goal: RH STG ADHERE TO SAFETY PRECAUTIONS W/ASSISTANCE/DEVICE Description: STG Adhere to Safety Precautions With supervision Assistance/Device. Outcome: Progressing   Problem: RH PAIN MANAGEMENT Goal: RH STG PAIN MANAGED AT OR BELOW PT'S PAIN GOAL Description: <3 on a 0-10 pain scale. Outcome: Progressing   Problem: RH KNOWLEDGE DEFICIT GENERAL Goal: RH STG INCREASE KNOWLEDGE OF SELF CARE AFTER HOSPITALIZATION Description: Patient will be able to demonstrate knowledge of medication management, dietary restrictions, any weightbearing precautions, follow up recommendations, with educational materials and handouts with cues and reminders from staff. Outcome: Progressing

## 2020-10-31 NOTE — Evaluation (Signed)
Physical Therapy Assessment and Plan  Patient Details  Name: Lisa [name] MRN: [id number] Date of Birth: [birth year]  PT Diagnosis: Abnormality of gait, Difficulty walking, Muscle weakness and Pain in joint Rehab Potential:   ELOS: 10-14 days   Today's Date: 10/31/2020 PT Individual Time: 0626-9485 PT Individual Time Calculation (min): 62 min    Hospital Problem: Principal Problem:   Debility   Past Medical History:  Past Medical History:  Diagnosis Date  . Adenocarcinoma of left lung ([place]) 2006  . Arthritis   . Asthma   . Cancer of breast, female ([place])   . Cancer of lung ([place])   . Hypertension   . Hypothyroidism   . Invasive ductal carcinoma of left breast ([place]) 1999  . Neutropenia ([place]) 06/09/2016  . Personal history of radiation therapy    Past Surgical History:  Past Surgical History:  Procedure Laterality Date  . BIOPSY  10/23/2020   Procedure: BIOPSY;  Surgeon: Yetta Flock, MD;  Location: [facility] ENDOSCOPY;  Service: Gastroenterology;;  . BREAST BIOPSY     left axillary node dissection  . BREAST LUMPECTOMY Left   . COLONOSCOPY  03/08/2003   IOE:[id number] rectal polyps destroyed with the tip of the snare/Polyps at hepatic flexure, splenic flexure at 35 cm/Left-sided diverticula: unable to retrieve path  . COLONOSCOPY  09/12/2008   WEX:HBZJIR rectum and distal sigmoid diminutive polyps/scattered left sided diverticulum. hyperplastic  . COLONOSCOPY N/A 03/06/2013   CVE:LFYBOFB polyp-removed as described above; colonic diverticulosis. hyperplastic polyps. next TCS 03/2018  . ESOPHAGOGASTRODUODENOSCOPY (EGD) WITH PROPOFOL N/A 10/23/2020   Procedure: ESOPHAGOGASTRODUODENOSCOPY (EGD) WITH PROPOFOL;  Surgeon: Yetta Flock, MD;  Location: [facility];  Service: Gastroenterology;  Laterality: N/A;  . FOOT SURGERY    . LUNG REMOVAL, PARTIAL     upper lobe    Assessment & Plan Clinical Impression: Lisa [name] 86 year old right-handed female with history of  breast cancer 1999 treated with lumpectomy and radiation as well as tamoxifen for 5 years, lung cancer with partial upper lobe lobectomy 2006, AKI thought secondary to ARB seen by nephrology services in the past, pancytopenia followed by hematology services in the past, hypothyroidism, quit smoking 29 years ago and gout.  History taken from chart review and patient.  Patient lives with her 37 year old daughter.  Multilevel home 5 steps to entry.  Independent until approximately 3 weeks ago with progressive decrease in mobility.  She presented on 10/21/2020 with generalized weakness, nausea, weight loss, decreased oral intake x3 weeks.  Admission chemistries sodium 122, glucose 121 BUN 30 creatinine 1.53, hemoglobin 10.9, urinalysis negative nitrite, urine culture no growth, uric acid 9.5, troponin 35-41, TSH 14.355, osmolality 276.  She was noted to have new onset atrial fibrillation, cardiology consulted.  Echocardiogram with ejection fraction of 65-70 %, no wall motion abnormalities grade 1 diastolic dysfunction.  She was placed on beta-blocker and monitored.  Lower extremity Doppler negative for DVT.  Gastroenterology services consulted for weight loss and poor appetite.  Underwent upper GI endoscopy on 10/23/2020 per Dr. Havery Moros.  No focal ulcerations noted.  There was a 3 cm hiatal hernia present.  The esophagus was tortuous.  Biopsies were taken with cold forceps in the gastric body.  H. pylori was positive and she was started on triple therapy regimen x2 weeks total and then continue Protonix twice daily.  Orthopedic services consulted 10/26/2020 for bilateral knee joint effusions x-ray showed arthritic changes.  She did undergo joint aspiration of both knees.  Fluid analysis showed no evidence of infection  but urate crystals diagnostic data arthritis.  She was placed on allopurinol but hold on colchicine due to history of AKI.  Her renal function has stabilized with latest creatinine 1.34 and monitored by  renal services.  She is tolerating a regular diet.  Due to patient's decrease in functional ability and need for assist with ADLs she was admitted for a comprehensive rehab program.   Patient transferred to [place] on 10/30/2020 .   Patient currently requires mod with mobility secondary to muscle weakness, muscle joint tightness and pain and decreased coordination.  Prior to hospitalization, patient was independent  with mobility and lived with Daughter,Other (Comment) (son coming to stay for a month as well) in a House home.  Home access is 5Level entry,Stairs to enter.  Patient will benefit from skilled PT intervention to maximize safe functional mobility, minimize fall risk and decrease caregiver burden for planned discharge home with 24 hour supervision.  Anticipate patient will benefit from follow up [place] at discharge.  PT - End of Session Activity Tolerance: Decreased this session;Tolerates 10 - 20 min activity with multiple rests Endurance Deficit: Yes Endurance Deficit Description: HR 131 with short distance ambulation or stair negotiation PT Assessment PT Patient demonstrates impairments in the following area(s): Balance;Pain;Endurance;Motor;Safety PT Transfers Functional Problem(s): Bed Mobility;Bed to Chair;Car;Furniture PT Locomotion Functional Problem(s): Ambulation;Stairs PT Plan PT Intensity: Minimum of 1-2 x/day ,45 to 90 minutes PT Frequency: 5 out of 7 days PT Duration Estimated Length of Stay: 10-14 days PT Treatment/Interventions: Ambulation/gait training;DME/adaptive equipment instruction;Functional mobility training;Therapeutic Activities;UE/LE Strength taining/ROM;UE/LE Coordination activities;Therapeutic Exercise;Stair training;Patient/family education;Balance/vestibular training;Wheelchair propulsion/positioning PT Transfers Anticipated Outcome(s): S PT Locomotion Anticipated Outcome(s): S with ambulation with assistive device PT Recommendation Follow Up Recommendations: Home  health PT Patient destination: Home Equipment Recommended: 3 in 1 bedside comode   PT Evaluation Precautions/Restrictions Precautions Precautions: Fall Precaution Comments: unsteady knees Restrictions Weight Bearing Restrictions: No General   Vital Signs Pain Pain Assessment Pain Scale: 0-10 Pain Score: 0-No pain Home Living/Prior Functioning Home Living Available Help at Discharge: Family;Available PRN/intermittently;Available 24 hours/day Type of Home: House Home Access: Level entry;Stairs to enter Entrance Stairs-Number of Steps: 5 Entrance Stairs-Rails: Can reach both Home Layout: Multi-level Alternate Level Stairs-Number of Steps: 14 to basement, entry level has bedrooms, etc Bathroom Shower/Tub: Chiropodist: Standard Bathroom Accessibility: Yes  Lives With: Daughter;Other (Comment) (son coming to stay for a month as well) Prior Function Level of Independence: Independent with basic ADLs;Independent with homemaking with ambulation;Independent with gait;Independent with transfers Driving: Yes Vision/Perception  Perception Perception: Within Functional Limits Praxis Praxis: Intact  Cognition Overall Cognitive Status: Within Functional Limits for tasks assessed Arousal/Alertness: Awake/alert Orientation Level: Oriented X4 Sensation Sensation Light Touch: Appears Intact Coordination Gross Motor Movements are Fluid and Coordinated: No Fine Motor Movements are Fluid and Coordinated: No Coordination and Movement Description: slowed and with resting tremor in hands Motor  Motor Motor: Other (comment) Motor - Skilled Clinical Observations: Generalized weakness; resting tremor in hands, decreased coordination   Trunk/Postural Assessment  Cervical Assessment Cervical Assessment: Exceptions to [facility] (forward head) Thoracic Assessment Thoracic Assessment: Exceptions to [facility] (rounded shoulders) Lumbar Assessment Lumbar Assessment: Exceptions to [facility]  (posterior pelvic tilt) Postural Control Postural Control: Within Functional Limits  Balance Balance Balance Assessed: Yes Dynamic Sitting Balance Dynamic Sitting - Balance Support: Feet supported;During functional activity Dynamic Sitting - Level of Assistance: 5: Stand by assistance Dynamic Sitting - Balance Activities: Forward lean/weight shifting;Lateral lean/weight shifting Dynamic Standing Balance Dynamic Standing - Balance Support: Bilateral upper  extremity supported Dynamic Standing - Level of Assistance: 4: Min assist Dynamic Standing - Balance Activities: Lateral lean/weight shifting;Other (comment) Dynamic Standing - Comments: reaching for pants to pull up Extremity Assessment      RLE Assessment RLE Assessment: Exceptions to [facility] Active Range of Motion (AROM) Comments: AROM generally WFL, but pain with flexion to 90 General Strength Comments: hip flexion 3-/5, knee extension 4-/5, flexion 3/5, ankle DF 4/5 LLE Assessment LLE Assessment: Exceptions to [facility] Active Range of Motion (AROM) Comments: Generally WFL for mobility General Strength Comments: hip flexion 3/, knee extension 4/5, knee flexion 4-/5, ankle DF 4/5  Care Tool Care Tool Bed Mobility Roll left and right activity        Sit to lying activity   Sit to lying assist level: Supervision/Verbal cueing    Lying to sitting edge of bed activity   Lying to sitting edge of bed assist level: Supervision/Verbal cueing     Care Tool Transfers Sit to stand transfer   Sit to stand assist level: Moderate Assistance - Patient 50 - 74%    Chair/bed transfer   Chair/bed transfer assist level: Moderate Assistance - Patient 50 - 74%     Toilet transfer   Assist Level: Moderate Assistance - Patient 50 - 74%    Car transfer Car transfer activity did not occur: Safety/medical concerns        Care Tool Locomotion Ambulation   Assist level: Minimal Assistance - Patient > 75% Assistive device: Walker-rolling Max  distance: 22'  Walk 10 feet activity   Assist level: Minimal Assistance - Patient > 75% Assistive device: Walker-rolling   Walk 50 feet with 2 turns activity Walk 50 feet with 2 turns activity did not occur: Safety/medical concerns      Walk 150 feet activity Walk 150 feet activity did not occur: Safety/medical concerns      Walk 10 feet on uneven surfaces activity Walk 10 feet on uneven surfaces activity did not occur: Safety/medical concerns      Stairs   Assist level: Moderate Assistance - Patient - 50 - 74% Stairs assistive device: 2 hand rails Max number of stairs: 3  Walk up/down 1 step activity   Walk up/down 1 step (curb) assist level: Moderate Assistance - Patient - 50 - 74% Walk up/down 1 step or curb assistive device: 2 hand rails Walk up/down 4 steps activity did not occuR: Safety/medical concerns  Walk up/down 4 steps activity      Walk up/down 12 steps activity Walk up/down 12 steps activity did not occur: Safety/medical concerns      Pick up small objects from floor Pick up small object from the floor (from standing position) activity did not occur: Safety/medical concerns      Wheelchair Will patient use wheelchair at discharge?: No Type of Wheelchair: Manual   Wheelchair assist level: Supervision/Verbal cueing Max wheelchair distance: 68'  Wheel 50 feet with 2 turns activity   Assist Level: Supervision/Verbal cueing  Wheel 150 feet activity Wheelchair 150 feet activity did not occur: Safety/medical concerns      Refer to Care Plan for Long Term Goals  SHORT TERM GOAL WEEK 1 PT Short Term Goal 1 (Week 1): Patient to perform transfers with min A. PT Short Term Goal 2 (Week 1): Patient to ambulate 84' with RW and min A. PT Short Term Goal 3 (Week 1): Patient to negotiate 8 short steps with rails and min A  Recommendations for other services: None   Skilled Therapeutic  Intervention  Patient in recliner and agreeable to PT.  Reports felt good getting  sponge bath with OT.  States knee pain worse on R and medial aspect.  Patient seated for MMT and questions about history.  Patient sit to stand mod A with cues for hand placement and technique.  Stand pivot to bed with min to mod A for RW management, safety and controlled sit.  Patient sit <> supine to flat bed with S and increased time.  Performed sit to stand from bed also with mod A and stand step to w/c with RW.  Patient in w/c propelled x 79' with S and increased time with cues for technique.  Patient in therapy gym sit to stand with mod A to RW and negotiated 3 (3") steps with rails and min to mod A with cues for technique.  Patient seated with HR 131, SpO2 93%, recovered to 97 bpm in 3 minutes seated rest.  Patient ambulated with RW and min A x 22' with w/c following, reports limited by knee pain.  Assisted pt to room in w/c and pt requested to toilet.  Assisted with stand pivot to [facility] and pt able to perform about half of clothing management and all of hygiene seated on BSC.  Assisted to recliner end of session and left with chair alarm active and needs in reach.  Mobility Transfers Transfers: Sit to Stand;Stand to Sit;Stand Pivot Transfers Sit to Stand: Moderate Assistance - Patient 50-74% Stand to Sit: Minimal Assistance - Patient > 75% Stand Pivot Transfers: Moderate Assistance - Patient 50 - 74% Stand Pivot Transfer Details: Verbal cues for technique;Verbal cues for safe use of DME/AE;Verbal cues for precautions/safety Stand Pivot Transfer Details (indicate cue type and reason): cues for hand placement, cues for keeping walker close Transfer (Assistive device): Rolling walker Locomotion  Gait Ambulation: Yes Gait Assistance: Minimal Assistance - Patient > 75% Gait Distance (Feet): 22 Feet Assistive device: Rolling walker Gait Assistance Details: Verbal cues for precautions/safety;Verbal cues for safe use of DME/AE Gait Assistance Details: cues for walker proximity Gait Gait: Yes Gait  Pattern: Impaired Gait Pattern: Step-to pattern;Decreased stride length;Antalgic;Decreased stance time - right;Poor foot clearance - left;Poor foot clearance - right Gait velocity: very slow and guarded Wheelchair Mobility Wheelchair Mobility: Yes Wheelchair Assistance: Chartered loss adjuster: Both upper extremities Wheelchair Parts Management: Needs assistance Distance: 9'   Discharge Criteria: Patient will be discharged from PT if patient refuses treatment 3 consecutive times without medical reason, if treatment goals not met, if there is a change in medical status, if patient makes no progress towards goals or if patient is discharged from hospital.  The above assessment, treatment plan, treatment alternatives and goals were discussed and mutually agreed upon: by patient  Jamison Oka, PT 10/31/2020, 12:32 PM

## 2020-10-31 NOTE — Evaluation (Signed)
Occupational Therapy Assessment and Plan  Patient Details  Name: Lisa [name] MRN: [id number] Date of Birth: 1933/12/08  OT Diagnosis: abnormal posture and muscle weakness (generalized) Rehab Potential:   ELOS: 10-14 days   Today's Date: 10/31/2020 OT Individual Time: 4098-1191 OT Individual Time Calculation (min): 75 min     Hospital Problem: Principal Problem:   Debility   Past Medical History:  Past Medical History:  Diagnosis Date  . Adenocarcinoma of left lung ([place]) 2006  . Arthritis   . Asthma   . Cancer of breast, female ([place])   . Cancer of lung ([place])   . Hypertension   . Hypothyroidism   . Invasive ductal carcinoma of left breast ([place]) 1999  . Neutropenia ([place]) 06/09/2016  . Personal history of radiation therapy    Past Surgical History:  Past Surgical History:  Procedure Laterality Date  . BIOPSY  10/23/2020   Procedure: BIOPSY;  Surgeon: Yetta Flock, MD;  Location: [facility] ENDOSCOPY;  Service: Gastroenterology;;  . BREAST BIOPSY     left axillary node dissection  . BREAST LUMPECTOMY Left   . COLONOSCOPY  03/08/2003   YNW:GNFAOZHYQM rectal polyps destroyed with the tip of the snare/Polyps at hepatic flexure, splenic flexure at 35 cm/Left-sided diverticula: unable to retrieve path  . COLONOSCOPY  09/12/2008   VHQ:IONGEX rectum and distal sigmoid diminutive polyps/scattered left sided diverticulum. hyperplastic  . COLONOSCOPY N/A 03/06/2013   BMW:UXLKGMW polyp-removed as described above; colonic diverticulosis. hyperplastic polyps. next TCS 03/2018  . ESOPHAGOGASTRODUODENOSCOPY (EGD) WITH PROPOFOL N/A 10/23/2020   Procedure: ESOPHAGOGASTRODUODENOSCOPY (EGD) WITH PROPOFOL;  Surgeon: Yetta Flock, MD;  Location: [facility];  Service: Gastroenterology;  Laterality: N/A;  . FOOT SURGERY    . LUNG REMOVAL, PARTIAL     upper lobe    Assessment & Plan Clinical Impression: Caitlyne Ingham 86 year old right-handed female with history of breast cancer 1999  treated with lumpectomy and radiation as well as tamoxifen for 5 years, lung cancer with partial upper lobe lobectomy 2006, AKI thought secondary to ARB seen by nephrology services in the past, pancytopenia followed by hematology services in the past, hypothyroidism, quit smoking 29 years ago and gout.  History taken from chart review and patient.  Patient lives with her 84 year old daughter.  Multilevel home 5 steps to entry.  Independent until approximately 3 weeks ago with progressive decrease in mobility.  She presented on 10/21/2020 with generalized weakness, nausea, weight loss, decreased oral intake x3 weeks.  Admission chemistries sodium 122, glucose 121 BUN 30 creatinine 1.53, hemoglobin 10.9, urinalysis negative nitrite, urine culture no growth, uric acid 9.5, troponin 35-41, TSH 14.355, osmolality 276.  She was noted to have new onset atrial fibrillation, cardiology consulted.  Echocardiogram with ejection fraction of 65-70 %, no wall motion abnormalities grade 1 diastolic dysfunction.  She was placed on beta-blocker and monitored.  Lower extremity Doppler negative for DVT.  Gastroenterology services consulted for weight loss and poor appetite.  Underwent upper GI endoscopy on 10/23/2020 per Dr. Havery Moros.  No focal ulcerations noted.  There was a 3 cm hiatal hernia present.  The esophagus was tortuous.  Biopsies were taken with cold forceps in the gastric body.  H. pylori was positive and she was started on triple therapy regimen x2 weeks total and then continue Protonix twice daily.  Orthopedic services consulted 10/26/2020 for bilateral knee joint effusions x-ray showed arthritic changes.  She did undergo joint aspiration of both knees.  Fluid analysis showed no evidence of infection but urate crystals diagnostic  data arthritis.  She was placed on allopurinol but hold on colchicine due to history of AKI.  Her renal function has stabilized with latest creatinine 1.34 and monitored by renal services.   She is tolerating a regular diet.  Due to patient's decrease in functional ability and need for assist with ADLs she was admitted for a comprehensive rehab program.  Please see preadmission assessment from earlier today as well.  Patient transferred to CIR on 10/30/2020 .    Patient currently requires min with basic self-care skills secondary to muscle weakness, decreased cardiorespiratoy endurance, impaired timing and sequencing and decreased motor planning and decreased standing balance, decreased postural control and decreased balance strategies.  Prior to hospitalization, patient could complete BADL with supervision - Mod I (daughter would Supervise showers).  Patient Lisa benefit from skilled intervention to decrease level of assist with basic self-care skills and increase independence with basic self-care skills prior to discharge home with care partner.  Anticipate patient Lisa require intermittent supervision and follow up home health.  OT - End of Session Activity Tolerance: Tolerates 30+ min activity with multiple rests Endurance Deficit: Yes OT Assessment OT Patient demonstrates impairments in the following area(s): Balance;Edema;Endurance;Motor;Pain;Safety OT Basic ADL's Functional Problem(s): Grooming;Bathing;Dressing;Toileting OT Transfers Functional Problem(s): Toilet;Tub/Shower OT Additional Impairment(s): None OT Plan OT Intensity: Minimum of 1-2 x/day, 45 to 90 minutes OT Frequency: 5 out of 7 days OT Duration/Estimated Length of Stay: 10-14 days OT Treatment/Interventions: Balance/vestibular training;Discharge planning;Pain management;Self Care/advanced ADL retraining;Therapeutic Activities;UE/LE Coordination activities;Visual/perceptual remediation/compensation;Therapeutic Exercise;Skin care/wound managment;Patient/family education;Functional mobility training;Disease mangement/prevention;Community reintegration;DME/adaptive equipment instruction;Neuromuscular  re-education;Psychosocial support;UE/LE Strength taining/ROM;Wheelchair propulsion/positioning OT Self Feeding Anticipated Outcome(s): no goal OT Basic Self-Care Anticipated Outcome(s): Supervision OT Toileting Anticipated Outcome(s): Supervision OT Bathroom Transfers Anticipated Outcome(s): Supervision OT Recommendation Patient destination: Home Follow Up Recommendations: Home health OT Equipment Recommended: To be determined   OT Evaluation Precautions/Restrictions    Fall Risk, weak knees Vital Signs Therapy Vitals Temp: (!) 97.5 F (36.4 C) Temp Source: Oral Pulse Rate: 88 Resp: 14 BP: (!) 141/60 Patient Position (if appropriate): Sitting Oxygen Therapy SpO2: 97 % Pain Pain Assessment Pain Scale: 0-10 Pain Score: 8  Pain Type: Acute pain Pain Location: Knee Pain Orientation: Right;Left Pain Descriptors / Indicators: Aching Pain Frequency: Constant Pain Onset: Gradual Pain Intervention(s): Medication (See eMAR) Home Living/Prior Functioning Home Living Family/patient expects to be discharged to:: Private residence Living Arrangements: Children Available Help at Discharge: Family,Available PRN/intermittently,Available 24 hours/day Type of Home: House Home Access: Level entry,Stairs to enter Entrance Stairs-Number of Steps: 5 Entrance Stairs-Rails: Can reach both Home Layout: Multi-level Alternate Level Stairs-Number of Steps: 14 to basement, entry level has bedrooms, etc Bathroom Shower/Tub: Chiropodist: Standard Bathroom Accessibility: Yes  Lives With: Daughter,Other (Comment) (son coming to stay for a month as well) Prior Function Level of Independence: Independent with basic ADLs,Independent with homemaking with ambulation,Independent with gait,Independent with transfers Driving: Yes Vision Baseline Vision/History: Wears glasses Wears Glasses: At all times Patient Visual Report: No change from baseline Vision Assessment?: No apparent  visual deficits Perception  Perception: Within Functional Limits Praxis Praxis: Intact Cognition Overall Cognitive Status: Within Functional Limits for tasks assessed Arousal/Alertness: Awake/alert Orientation Level: Person;Place;Situation Person: Oriented Place: Oriented Situation: Oriented Year: 2021 Month: December Day of Week: Correct Memory: Appears intact Immediate Memory Recall: Sock;Blue;Bed Memory Recall Sock: Without Cue Memory Recall Blue: With Cue Memory Recall Bed: Not able to recall Awareness: Appears intact Problem Solving: Appears intact Safety/Judgment: Appears intact Sensation Sensation Light Touch: Appears Intact Coordination  Gross Motor Movements are Fluid and Coordinated: No Fine Motor Movements are Fluid and Coordinated: No Coordination and Movement Description: slowed and with resting tremor in hands Motor  Motor Motor: Other (comment) (limited by weakness, decreased coordination with transitional movements) Motor - Skilled Clinical Observations: Generalized weakness; resting tremor in hands, decreased coordination  Trunk/Postural Assessment  Cervical Assessment Cervical Assessment: Exceptions to  [facility] Thoracic Assessment Thoracic Assessment: Exceptions to [facility] Lumbar Assessment Lumbar Assessment: Exceptions to [facility] Postural Control Postural Control: Within Functional Limits  Balance Balance Balance Assessed: Yes Static Sitting Balance Static Sitting - Balance Support: Feet supported;No upper extremity supported Static Sitting - Level of Assistance: 5: Stand by assistance Dynamic Sitting Balance Dynamic Sitting - Balance Support: Feet supported;During functional activity Dynamic Sitting - Level of Assistance: 5: Stand by assistance Dynamic Sitting - Balance Activities: Forward lean/weight shifting;Lateral lean/weight shifting;Reaching for objects Static Standing Balance Static Standing - Balance Support: During functional activity;Bilateral  upper extremity supported Static Standing - Level of Assistance: 4: Min assist Dynamic Standing Balance Dynamic Standing - Balance Support: Bilateral upper extremity supported;During functional activity Dynamic Standing - Level of Assistance: 4: Min assist Dynamic Standing - Balance Activities: Lateral lean/weight shifting;Other (comment);Reaching for objects Dynamic Standing - Comments: during LB ADL for bathe/dress at sink level Extremity/Trunk Assessment RUE Assessment RUE Assessment: Within Functional Limits General Strength Comments: roughly 4/5, generalized weakness present but functional for ADL LUE Assessment LUE Assessment: Within Functional Limits General Strength Comments: roughly 4/5, generalized weakness present but functional for ADL  Care Tool Care Tool Self Care Eating    Indep    Oral Care     Set Up    Bathing   Body parts bathed by patient: Right arm;Left arm;Chest;Abdomen;Front perineal area;Right upper leg;Left upper leg;Right lower leg;Left lower leg;Face     Assist Level: Minimal Assistance - Patient > 75%    Upper Body Dressing(including orthotics)   What is the patient wearing?: Pull over shirt   Assist Level: Supervision/Verbal cueing    Lower Body Dressing (excluding footwear)   What is the patient wearing?: Pants;Incontinence brief Assist for lower body dressing: Moderate Assistance - Patient 50 - 74%    Putting on/Taking off footwear   What is the patient wearing?: Non-skid slipper socks Assist for footwear: Dependent - Patient 0%       Care Tool Toileting Toileting activity   Assist for toileting: Moderate Assistance - Patient 50 - 74%     Care Tool Bed Mobility Roll left and right activity     Supervision    Sit to lying activity     Supervision    Lying to sitting edge of bed activity    Supervision     Care Tool Transfers Sit to stand transfer   Sit to stand assist level: Moderate Assistance - Patient 50 - 74%     Chair/bed transfer   Chair/bed transfer assist level: Moderate Assistance - Patient 50 - 74%     Toilet transfer   Assist Level: Moderate Assistance - Patient 50 - 74%     Care Tool Cognition Expression of Ideas and Wants Expression of Ideas and Wants: Without difficulty (complex and basic) - expresses complex messages without difficulty and with speech that is clear and easy to understand   Understanding Verbal and Non-Verbal Content Understanding Verbal and Non-Verbal Content: Understands (complex and basic) - clear comprehension without cues or repetitions   Memory/Recall Ability *first 3 days only Memory/Recall Ability *first 3 days only: Current season;Location of  own room;That he or she is in a hospital/hospital unit    Refer to Care Plan for [place] 1 OT Short Term Goal 1 (Week 1): Pt Lisa perform 3/3 toilet tasks with CGA OT Short Term Goal 2 (Week 1): Pt Lisa consistently perform sit to stand at RW with CGA for LB ADL OT Short Term Goal 3 (Week 1): Pt Lisa perform LB dress with compensatory techniques PRN with CGA OT Short Term Goal 4 (Week 1): pt Lisa perform activity/ADL for 10+ mins without fatigue  Recommendations for other services: None    Skilled Therapeutic Intervention ADL ADL Grooming: Setup Upper Body Bathing: Supervision/safety Where Assessed-Upper Body Bathing: Sitting at sink Lower Body Bathing: Minimal assistance Where Assessed-Lower Body Bathing: Sitting at sink;Standing at sink Upper Body Dressing: Setup Where Assessed-Upper Body Dressing: Sitting at sink Lower Body Dressing: Minimal assistance Where Assessed-Lower Body Dressing: Standing at sink;Sitting at sink Toileting: Moderate assistance Toilet Transfer: Moderate assistance Mobility  Transfers Sit to Stand: Moderate Assistance - Patient 50-74% Stand to Sit: Minimal Assistance - Patient > 75%   Pt greeted at time of session sitting up in bed with dietary  receiving order, once finished pt agreeable to OT eval and session. Explained role and purpose of OT and pt agreeable and verbalized understanding. See above and below for full detail.   Bed mobility Supervision to sit EOB, sit to stand with Mod A at RW and stand pivot with Min/Mod to wheelchair. Set up at sink level and session focus on UB/LB bathing with supervision for UB and Min for LB at sit to stand level for buttocks only, pt able to reach BLEs including feet with forward flexion and slight figure four. UB dress set up and pants with Min A. Mild discomfort in B knees throughout session but tolerable, no number given but rest breaks PRN. Therapist demonstrated TTB transfer in shower room for pt as she has a tub at home and normally gets in in unsafe manner, pt agreeable to TTB and wants one for home, declined practice today. Once back in room, stand pivot to recliner with Mod A and alarm on call bell in reach.    Discharge Criteria: Patient Lisa be discharged from OT if patient refuses treatment 3 consecutive times without medical reason, if treatment goals not met, if there is a change in medical status, if patient makes no progress towards goals or if patient is discharged from hospital.  The above assessment, treatment plan, treatment alternatives and goals were discussed and mutually agreed upon: by patient  Viona Gilmore 10/31/2020, 4:48 PM

## 2020-10-31 NOTE — Progress Notes (Signed)
[facility] PHYSICAL MEDICINE & REHABILITATION PROGRESS NOTE   Subjective/Complaints:  Pt reports doesn't eat much, esp for breakfast- ate a banana- Having R medial knee pain- started yesterday- wasn't sure if received pain meds, but has tylenol scheduled around the clock.   No other issues- denies constipation.    ROS:  Pt denies SOB, abd pain, CP, N/V/C/D, and vision changes   Objective:   No results found. Recent Labs    10/29/20 0312 10/31/20 0509  WBC 4.1 5.7  HGB 8.4* 8.0*  HCT 27.8* 27.2*  PLT 162 156   Recent Labs    10/30/20 0303 10/31/20 0509  NA 132* 134*  K 4.1 4.3  CL 99 99  CO2 23 26  GLUCOSE 110* 106*  BUN 22 16  CREATININE 1.34* 1.03*  CALCIUM 8.9 9.0    Intake/Output Summary (Last 24 hours) at 10/31/2020 1041 Last data filed at 10/31/2020 0854 Gross per 24 hour  Intake 540 ml  Output --  Net 540 ml        Physical Exam: Vital Signs Blood pressure 133/74, pulse 89, temperature 98.6 F (37 C), temperature source Oral, resp. rate 18, height 5\' 7"  (1.702 m), weight 93.2 kg, SpO2 (!) 86 %.   General:   pt awake, appropriate, BMI 32, sitting up in bed, NAD- didn't eat a lot of breakfast <25%.  HENT: conjugate gaze.  Cardiovascular: RRR- no JVD  Pulmonary: CTA B/L- no W/R/R- good air movement Abdominal: Soft, NT, ND, (+)BS   Musculoskeletal: TTP over R medial knee - mild knee effusion     Cervical back: Normal range of motion and neck supple.     Comments: No edema or tenderness in extremities  Skin:    General: Skin is warm and dry.  Neurological: Ox3 HOH Bilateral upper extremities: 5/5 proximal distal bilateral lower extremities: Hip flexion: 4-4+/5, distally 5/5  Psychiatric:    appropriate    Assessment/Plan: 1. Functional deficits which require 3+ hours per day of interdisciplinary therapy in a comprehensive inpatient rehab setting.  Physiatrist is providing close team supervision and 24 hour management of active medical  problems listed below.  Physiatrist and rehab team continue to assess barriers to discharge/monitor patient progress toward functional and medical goals  Care Tool:  Bathing    Body parts bathed by patient: Right arm,Left arm,Chest,Abdomen,Front perineal area,Right upper leg,Left upper leg,Right lower leg,Left lower leg,Face   Body parts bathed by helper: Buttocks     Bathing assist Assist Level: Minimal Assistance - Patient > 75%     Upper Body Dressing/Undressing Upper body dressing   What is the patient wearing?: Pull over shirt    Upper body assist Assist Level: Supervision/Verbal cueing    Lower Body Dressing/Undressing Lower body dressing      What is the patient wearing?: Pants,Incontinence brief     Lower body assist Assist for lower body dressing: Moderate Assistance - Patient 50 - 74%     Toileting Toileting    Toileting assist Assist for toileting: Moderate Assistance - Patient 50 - 74%     Transfers Chair/bed transfer  Transfers assist     Chair/bed transfer assist level: Moderate Assistance - Patient 50 - 74%     Locomotion Ambulation   Ambulation assist              Walk 10 feet activity   Assist           Walk 50 feet activity   Assist  Walk 150 feet activity   Assist           Walk 10 feet on uneven surface  activity   Assist           Wheelchair     Assist               Wheelchair 50 feet with 2 turns activity    Assist            Wheelchair 150 feet activity     Assist          Blood pressure 133/74, pulse 89, temperature 98.6 F (37 C), temperature source Oral, resp. rate 18, height 5\' 7"  (1.702 m), weight 93.2 kg, SpO2 (!) 86 %.  Medical Problem List and Plan: 1.  Debility secondary to AKI superimposed on CKD stage III/hyponatremia/gout/atrial fibrillation             -patient may shower             -ELOS/Goals: 10-14 days/supervision/mod I              Admit to CIR 2.  Antithrombotics: -DVT/anticoagulation: SCDs             -antiplatelet therapy: N/A 3. Pain Management: Tylenol as needed  12/30- was scheduled- con't regimen 4. Mood: Provide emotional support             -antipsychotic agents: N/A 5. Neuropsych: This patient is capable of making decisions on her own behalf. 6. Skin/Wound Care: Routine skin checks 7. Fluids/Electrolytes/Nutrition: Routine in and outs             CMP ordered for tomorrow. 8.  Acute new onset atrial fibrillation: Monitor with increased exertion. 9.  H. pylori positive.  Completed triple regimen therapy and then maintain Protonix twice daily 10.  Bilateral lower extremity knee pain.  Arthrocentesis of both knees.  Fluid analysis no evidence of infection but urate crystals diagnostic gouty arthritis.  Continue allopurinol.  No colchicine due to AKI  12/30- pain R medial knee, con't allopurinol and tylenol scheduled.              Monitor with increased activity 11.  Hypothyroidism: Synthroid 12.  Decreased nutritional storage.  Dietary follow-up 13.  AKI superimposed on CKD stage III.               12/30- Cr down to 1.0- con't to monitor. 14.  History of breast cancer in 1999 treated with lumpectomy radiation as well as tamoxifen.  Follow-up outpatient 15.  History of lung cancer.  Status post partial upper lobe lobectomy 2006.  Follow-up outpatient  12/30- will monitor- sats decreased somewhat 16.  Anemia of chronic disease/pancytopenia.  Patient has been seen by hematology services outpatient.             12/30- Hb 8.0 down from 8.4- Plts 160s- con't to monitor- no SQ Heparin for now due to pancytopenia will monitor.    LOS: 1 days A FACE TO FACE EVALUATION WAS PERFORMED  Reanna Scoggin 10/31/2020, 10:41 AM

## 2020-10-31 NOTE — Progress Notes (Signed)
Physical Therapy Session Note  Patient Details  Name: Lisa [name] MRN: [id number] Date of Birth: 13-Jun-1934  Today's Date: 10/31/2020 PT Individual Time: 3329-5188 PT Individual Time Calculation (min): 55 min   Short Term Goals: Week 1:  PT Short Term Goal 1 (Week 1): Patient to perform transfers with min A. PT Short Term Goal 2 (Week 1): Patient to ambulate 78' with RW and min A. PT Short Term Goal 3 (Week 1): Patient to negotiate 8 short steps with rails and min A  Skilled Therapeutic Interventions/Progress Updates:    Patient in recliner and agreeable to PT, though reports knee pain has worsened throughout the day.  RN made aware.  Patient sit to stand mod A and stand step to w/c min to mod A.  Patient propelled w/c x 50' with S increased time.  Assisted into ortho gym.  Demonstrated car transfer to simulated small SUV height as reports her daughter has small SUV.  Patient performed with mod A and mod cues for scooting back and assisted both legs in and out.  Patient reported to much knee pain to attempt further ambulation this pm.  Assisted to room in w/c and to [facility] via stand pivot with mod A using RW.  Pt performed hygiene while seated.  Assisted with clothing management.  Stand step to recliner with min A. Patient seated for therex including LAQ x 5 reps due to pain, legs elevated ankle pumps, SLR, hip abduction, hip adductor squeezes with quad set, glut squeezes all x 10.  With orange t-band performed seated rows, bicep curls, and tricep extensions.  Left seated in recliner with needs in reach and chair alarm activated.   Therapy Documentation Precautions:  Precautions Precautions: Fall Precaution Comments: unsteady knees Restrictions Weight Bearing Restrictions: No  Pain: Pain Assessment Pain Scale: 0-10 Pain Score: 7  Pain Type: Acute pain Pain Location: Knee Pain Orientation: Right;Left Pain Descriptors / Indicators: Aching;Sore Pain Frequency: Constant Pain Onset: With  Activity Pain Intervention(s): RN made aware;Repositioned   Therapy/Group: Individual Therapy  [name]  Magda Kiel, PT 10/31/2020, 5:32 PM

## 2020-10-31 NOTE — Plan of Care (Signed)
  Problem: RH Balance Goal: LTG: Patient will maintain dynamic sitting balance (OT) Description: LTG:  Patient will maintain dynamic sitting balance with assistance during activities of daily living (OT) Flowsheets (Taken 10/31/2020 1657) LTG: Pt will maintain dynamic sitting balance during ADLs with: Independent Goal: LTG Patient will maintain dynamic standing with ADLs (OT) Description: LTG:  Patient will maintain dynamic standing balance with assist during activities of daily living (OT)  Flowsheets (Taken 10/31/2020 1657) LTG: Pt will maintain dynamic standing balance during ADLs with: Supervision/Verbal cueing   Problem: Sit to Stand Goal: LTG:  Patient will perform sit to stand in prep for activites of daily living with assistance level (OT) Description: LTG:  Patient will perform sit to stand in prep for activites of daily living with assistance level (OT) Flowsheets (Taken 10/31/2020 1657) LTG: PT will perform sit to stand in prep for activites of daily living with assistance level: Supervision/Verbal cueing   Problem: RH Grooming Goal: LTG Patient will perform grooming w/assist,cues/equip (OT) Description: LTG: Patient will perform grooming with assist, with/without cues using equipment (OT) Flowsheets (Taken 10/31/2020 1657) LTG: Pt will perform grooming with assistance level of: Set up assist    Problem: RH Bathing Goal: LTG Patient will bathe all body parts with assist levels (OT) Description: LTG: Patient will bathe all body parts with assist levels (OT) Flowsheets (Taken 10/31/2020 1657) LTG: Pt will perform bathing with assistance level/cueing: Supervision/Verbal cueing   Problem: RH Dressing Goal: LTG Patient will perform upper body dressing (OT) Description: LTG Patient will perform upper body dressing with assist, with/without cues (OT). Flowsheets (Taken 10/31/2020 1657) LTG: Pt will perform upper body dressing with assistance level of: Set up assist Goal: LTG  Patient will perform lower body dressing w/assist (OT) Description: LTG: Patient will perform lower body dressing with assist, with/without cues in positioning using equipment (OT) Flowsheets (Taken 10/31/2020 1657) LTG: Pt will perform lower body dressing with assistance level of: Supervision/Verbal cueing   Problem: RH Toileting Goal: LTG Patient will perform toileting task (3/3 steps) with assistance level (OT) Description: LTG: Patient will perform toileting task (3/3 steps) with assistance level (OT)  Flowsheets (Taken 10/31/2020 1657) LTG: Pt will perform toileting task (3/3 steps) with assistance level: Supervision/Verbal cueing   Problem: RH Toilet Transfers Goal: LTG Patient will perform toilet transfers w/assist (OT) Description: LTG: Patient will perform toilet transfers with assist, with/without cues using equipment (OT) Flowsheets (Taken 10/31/2020 1657) LTG: Pt will perform toilet transfers with assistance level of: Supervision/Verbal cueing   Problem: RH Tub/Shower Transfers Goal: LTG Patient will perform tub/shower transfers w/assist (OT) Description: LTG: Patient will perform tub/shower transfers with assist, with/without cues using equipment (OT) Flowsheets (Taken 10/31/2020 1657) LTG: Pt will perform tub/shower stall transfers with assistance level of: Supervision/Verbal cueing

## 2020-10-31 NOTE — Progress Notes (Signed)
Inpatient [facility] Individual Statement of Services  Patient Name:  Lisa [name]  Date:  10/31/2020  Welcome to the [facility].  Our goal is to provide you with an individualized program based on your diagnosis and situation, designed to meet your specific needs.  With this comprehensive rehabilitation program, you will be expected to participate in at least 3 hours of rehabilitation therapies Monday-Friday, with modified therapy programming on the weekends.  Your rehabilitation program will include the following services:  Physical Therapy (PT), Occupational Therapy (OT), 24 hour per day rehabilitation nursing, Care Coordinator, Rehabilitation Medicine, Nutrition Services and Pharmacy Services  Weekly team conferences will be held on Tuesday to discuss your progress.  Your Inpatient Rehabilitation Care Coordinator will talk with you frequently to get your input and to update you on team discussions.  Team conferences with you and your family in attendance may also be held.  Expected length of stay: 10-14 days  Overall anticipated outcome: supervision with cueing  Depending on your progress and recovery, your program may change. Your Inpatient Rehabilitation Care Coordinator will coordinate services and will keep you informed of any changes. Your Inpatient Rehabilitation Care Coordinator's name and contact numbers are listed  below.  The following services may also be recommended but are not provided by the [facility] will be made to provide these services after discharge if needed.  Arrangements include referral to agencies that provide these services.  Your insurance has been verified to be:  Medicare & Tricare Your primary doctor is:  Allyn Kenner  Pertinent information will be shared with your doctor and your insurance  company.  Inpatient Rehabilitation Care Coordinator:  Ovidio Kin, [place] or Emilia Beck  Information discussed with and copy given to patient by: Elease Hashimoto, 10/31/2020, 1:02 PM

## 2020-11-01 ENCOUNTER — Inpatient Hospital Stay (HOSPITAL_COMMUNITY): Payer: Medicare Other | Admitting: Occupational Therapy

## 2020-11-01 ENCOUNTER — Inpatient Hospital Stay (HOSPITAL_COMMUNITY): Payer: Medicare Other

## 2020-11-01 LAB — PATHOLOGIST SMEAR REVIEW

## 2020-11-01 NOTE — IPOC Note (Signed)
Overall Plan of Care [facility]) Patient Details Name: Lisa [name] MRN: [id number] DOB: 19-Dec-1933  Admitting Diagnosis: Debility  Hospital Problems: Principal Problem:   Debility     Functional Problem List: Nursing Bladder,Bowel,Endurance,Medication Management,Nutrition,Pain,Safety  PT Balance,Pain,Endurance,Motor,Safety  OT Balance,Edema,Endurance,Motor,Pain,Safety  SLP    TR         Basic ADL's: OT Grooming,Bathing,Dressing,Toileting     Advanced  ADL's: OT       Transfers: PT Bed Mobility,Bed to Chair,Car,Furniture  OT Toilet,Tub/Shower     Locomotion: PT Ambulation,Stairs     Additional Impairments: OT None  SLP        TR      Anticipated Outcomes Item Anticipated Outcome  Self Feeding no goal  Swallowing      Basic self-care  Supervision  Toileting  Supervision   Bathroom Transfers Supervision  Bowel/Bladder  Mod I to supervision assist  Transfers  S  Locomotion  S with ambulation with assistive device  Communication     Cognition     Pain  <3  Safety/Judgment  Mod I to supervision assist   Therapy Plan: PT Intensity: Minimum of 1-2 x/day ,45 to 90 minutes PT Frequency: 5 out of 7 days PT Duration Estimated Length of Stay: 10-14 days OT Intensity: Minimum of 1-2 x/day, 45 to 90 minutes OT Frequency: 5 out of 7 days OT Duration/Estimated Length of Stay: 10-14 days     Due to the current state of emergency, patients may not be receiving their 3-hours of Medicare-mandated therapy.   Team Interventions: Nursing Interventions Patient/Family Education,Bladder Management,Bowel Management,Disease Management/Prevention,Pain Management,Medication Management,Discharge Planning,Psychosocial Support  PT interventions Ambulation/gait training,DME/adaptive equipment instruction,Functional mobility training,Therapeutic Activities,UE/LE Strength taining/ROM,UE/LE Coordination activities,Therapeutic Exercise,Stair training,Patient/family  education,Balance/vestibular training,Wheelchair propulsion/positioning  OT Interventions Balance/vestibular training,Discharge planning,Pain management,Self Care/advanced ADL retraining,Therapeutic Activities,UE/LE Coordination activities,Visual/perceptual remediation/compensation,Therapeutic Exercise,Skin care/wound managment,Patient/family education,Functional mobility training,Disease Programmer, applications re-education,Psychosocial support,UE/LE Strength taining/ROM,Wheelchair propulsion/positioning  SLP Interventions    TR Interventions    SW/CM Interventions Discharge Planning,Psychosocial Support,Patient/Family Education   Barriers to Discharge MD  Medical stability  Nursing Decreased caregiver support,Home environment access/layout,Incontinence,Lack of/limited family support,Medication compliance,Behavior    PT      OT      SLP      SW       Team Discharge Planning: Destination: PT-Home ,OT- Home , SLP-  Projected Follow-up: PT-Home health PT, OT-  Home health OT, SLP-  Projected Equipment Needs: PT-3 in 1 bedside comode, OT- To be determined, SLP-  Equipment Details: PT- , OT-  Patient/family involved in discharge planning: PT- Patient,  OT-Patient, SLP-   MD ELOS: 10-14 days S/modI Medical Rehab Prognosis:  Excellent Assessment: Lisa [name] is an 86 year old woman who is admitted to CIR with debility secondary to AKI superimposed on CKD stage III/hyponatremia/gout/atrial fibrillation. Her pain is currently well controlled. Labs are being monitored weekly given her AKI and anemia. HR is being monitored three times per day given her atrial fibrillation.    See Team Conference Notes for weekly updates to the plan of care

## 2020-11-01 NOTE — Plan of Care (Signed)
  Problem: Consults Goal: RH GENERAL PATIENT EDUCATION Description: See Patient Education module for education specifics. Outcome: Progressing   Problem: RH BOWEL ELIMINATION Goal: RH STG MANAGE BOWEL WITH ASSISTANCE Description: STG Manage Bowel with supervision Assistance. Outcome: Progressing Goal: RH STG MANAGE BOWEL W/MEDICATION W/ASSISTANCE Description: STG Manage Bowel with Medication with supervision Assistance. Outcome: Progressing   Problem: RH BLADDER ELIMINATION Goal: RH STG MANAGE BLADDER WITH ASSISTANCE Description: STG Manage Bladder With mod I Assistance Outcome: Progressing   Problem: RH SKIN INTEGRITY Goal: RH STG MAINTAIN SKIN INTEGRITY WITH ASSISTANCE Description: STG Maintain Skin Integrity With supervision Assistance. Outcome: Progressing Goal: RH STG ABLE TO PERFORM INCISION/WOUND CARE W/ASSISTANCE Description: STG Able To Perform Incision/Wound Care With supervision Assistance. Outcome: Progressing   Problem: RH SAFETY Goal: RH STG ADHERE TO SAFETY PRECAUTIONS W/ASSISTANCE/DEVICE Description: STG Adhere to Safety Precautions With supervision Assistance/Device. Outcome: Progressing   Problem: RH PAIN MANAGEMENT Goal: RH STG PAIN MANAGED AT OR BELOW PT'S PAIN GOAL Description: <3 on a 0-10 pain scale. Outcome: Progressing   Problem: RH KNOWLEDGE DEFICIT GENERAL Goal: RH STG INCREASE KNOWLEDGE OF SELF CARE AFTER HOSPITALIZATION Description: Patient will be able to demonstrate knowledge of medication management, dietary restrictions, any weightbearing precautions, follow up recommendations, with educational materials and handouts with cues and reminders from staff. Outcome: Progressing

## 2020-11-01 NOTE — Progress Notes (Signed)
Occupational Therapy Session Note  Patient Details  Name: Lisa [name] MRN: [id number] Date of Birth: 05/14/1934  Today's Date: 11/01/2020 OT Individual Time: 9798-9211 and 1200-1227 OT Individual Time Calculation (min): 56 min and 27 min  Short Term Goals: Week 1:  OT Short Term Goal 1 (Week 1): Pt will perform 3/3 toilet tasks with CGA OT Short Term Goal 2 (Week 1): Pt will consistently perform sit to stand at RW with CGA for LB ADL OT Short Term Goal 3 (Week 1): Pt will perform LB dress with compensatory techniques PRN with CGA OT Short Term Goal 4 (Week 1): pt will perform activity/ADL for 10+ mins without fatigue  Skilled Therapeutic Interventions/Progress Updates:    Pt greeted in the room, ambulating using the RW with NT. She had just used the restroom. Already eaten breakfast. Agreeable to wash her hands at the sink and then engage in bathing/dressing tasks at sit<stand level from the recliner. During stated tasks, pt required vcs for sequencing and hand placement during sit<stand transitions. Mod A for power up into standing. Supervision for UB self care. OT set up brief as underwear and pt able to thread both LEs into brief and also pants. She needed assistance to doff/don gripper socks bilaterally. Due to knee pain/discomfort in standing, she opted to complete oral care while seated with setup assist. At end of tx pt remained at bedside, sitting up in the recliner. All needs within reach, LEs elevated for comfort, chair alarm set. Tx focus placed on sit<stands, ADL retraining, activity tolerance, and functional cognition.   2nd Session 1:1 tx (27 min) Pt greeted in the recliner, knee pain manageable for tx without additional interventions. She requested to use the restroom. Mod A for sit<stand using RW with vcs for scooting forward and hand placement during power up. She ambulated with Min A to the elevated toilet. Educated pt how to use the drop arm feature of the elevated toilet so  that she could complete perihygiene herself while seated. Pt with B+B void, able to thoroughly clean herself in the front and back with supervision assist. Min A for sit<stand and for elevating brief. She ambulated back to the recliner and exhibited decreased eccentric control when lowering to sit. For remainder of session, focused on increasing her functional independence with donning footwear. Sock aide training initiated with pt able to use device with Mod A on the Lt foot, fading to Min A with repetition. Pt able to don her Rt gripper sock without AE use. At end of session pt opted to remain sitting up in the recliner, anticipating lunch soon. Left her with all needs within reach and chair alarm set.   Therapy Documentation Precautions:  Precautions Precautions: Fall Precaution Comments: unsteady knees Restrictions Weight Bearing Restrictions: No Vital Signs:   Pain: in knees, RN in during session to provide analgesic ointment to knees and also pain medicine   ADL: ADL Grooming: Setup Upper Body Bathing: Supervision/safety Where Assessed-Upper Body Bathing: Sitting at sink Lower Body Bathing: Minimal assistance Where Assessed-Lower Body Bathing: Sitting at sink,Standing at sink Upper Body Dressing: Setup Where Assessed-Upper Body Dressing: Sitting at sink Lower Body Dressing: Minimal assistance Where Assessed-Lower Body Dressing: Standing at sink,Sitting at sink Toileting: Moderate assistance Toilet Transfer: Moderate assistance      Therapy/Group: Individual Therapy  Avraham Benish A Vantasia Pinkney 11/01/2020, 3:12 PM

## 2020-11-01 NOTE — Progress Notes (Signed)
Family concerned about mag level and would like to know when level will be drawn again.

## 2020-11-01 NOTE — Progress Notes (Signed)
[facility] PHYSICAL MEDICINE & REHABILITATION PROGRESS NOTE   Subjective/Complaints: No complaints this morning.  Na trending upward.  Cr trending downward. Hgb 8.0, down from 8.4  ROS: c/o arthritic pain in her legs- stable- does not need intervention  Objective:   No results found. Recent Labs    10/31/20 0509  WBC 5.7  HGB 8.0*  HCT 27.2*  PLT 156   Recent Labs    10/30/20 0303 10/31/20 0509  NA 132* 134*  K 4.1 4.3  CL 99 99  CO2 23 26  GLUCOSE 110* 106*  BUN 22 16  CREATININE 1.34* 1.03*  CALCIUM 8.9 9.0    Intake/Output Summary (Last 24 hours) at 11/01/2020 1134 Last data filed at 11/01/2020 0730 Gross per 24 hour  Intake 777 ml  Output --  Net 777 ml        Physical Exam: Vital Signs Blood pressure 121/73, pulse 89, temperature 98 F (36.7 C), temperature source Oral, resp. rate 18, height 5\' 7"  (1.702 m), weight 93.2 kg, SpO2 92 %. Gen: no distress, normal appearing HEENT: oral mucosa pink and moist, NCAT Cardio: Reg rate Chest: normal effort, normal rate of breathing Abd: soft, non-distended Ext: no edema Skin: intact Musculoskeletal: TTP over R medial knee - mild knee effusion  Cervical back: Normal range of motion and neck supple.  Comments: No edema or tenderness in extremities Skin: General: Skin is warm and dry.  Neurological: Ox3 HOH Bilateral upper extremities: 5/5 proximal distal bilateral lower extremities: Hip flexion: 4-4+/5, distally 5/5 Psychiatric: appropriate    Assessment/Plan: 1. Functional deficits which require 3+ hours per day of interdisciplinary therapy in a comprehensive inpatient rehab setting.  Physiatrist is providing close team supervision and 24 hour management of active medical problems listed below.  Physiatrist and rehab team continue to assess barriers to discharge/monitor patient progress toward functional and medical goals  Care Tool:  Bathing    Body parts bathed by patient:  Right arm,Left arm,Chest,Abdomen,Front perineal area,Right upper leg,Left upper leg,Face,Buttocks   Body parts bathed by helper: Right lower leg,Left lower leg     Bathing assist Assist Level: Minimal Assistance - Patient > 75%     Upper Body Dressing/Undressing Upper body dressing   What is the patient wearing?: Pull over shirt    Upper body assist Assist Level: Supervision/Verbal cueing    Lower Body Dressing/Undressing Lower body dressing      What is the patient wearing?: Pants,Incontinence brief     Lower body assist Assist for lower body dressing: Moderate Assistance - Patient 50 - 74%     Toileting Toileting    Toileting assist Assist for toileting: Moderate Assistance - Patient 50 - 74%     Transfers Chair/bed transfer  Transfers assist     Chair/bed transfer assist level: Moderate Assistance - Patient 50 - 74%     Locomotion Ambulation   Ambulation assist      Assist level: Minimal Assistance - Patient > 75% Assistive device: Walker-rolling Max distance: 22'   Walk 10 feet activity   Assist     Assist level: Minimal Assistance - Patient > 75% Assistive device: Walker-rolling   Walk 50 feet activity   Assist Walk 50 feet with 2 turns activity did not occur: Safety/medical concerns         Walk 150 feet activity   Assist Walk 150 feet activity did not occur: Safety/medical concerns         Walk 10 feet on uneven surface  activity   Assist Walk 10 feet on uneven surfaces activity did not occur: Safety/medical concerns         Wheelchair     Assist Will patient use wheelchair at discharge?: No Type of Wheelchair: Manual    Wheelchair assist level: Supervision/Verbal cueing Max wheelchair distance: 34'    Wheelchair 50 feet with 2 turns activity    Assist        Assist Level: Supervision/Verbal cueing   Wheelchair 150 feet activity     Assist      Assist Level: Maximal Assistance - Patient 25  - 49% (pt able to propel 70 feet)   Blood pressure 121/73, pulse 89, temperature 98 F (36.7 C), temperature source Oral, resp. rate 18, height 5\' 7"  (1.702 m), weight 93.2 kg, SpO2 92 %.    Medical Problem List and Plan: 1. Debility secondary to AKI superimposed on CKD stage III/hyponatremia/gout/atrial fibrillation -patient may shower -ELOS/Goals: 10-14 days/supervision/mod I Continue CIR 2. Antithrombotics: -DVT/anticoagulation: SCDs -antiplatelet therapy: N/A 3. Pain Management: Tylenol as needed             12/31: well controlled, continue to monitor.  4. Mood: Provide emotional support -antipsychotic agents: N/A 5. Neuropsych: This patient is capable of making decisions on her own behalf. 6. Skin/Wound Care: Routine skin checks 7. Fluids/Electrolytes/Nutrition:  CMP stable 12/30, monitor routinely 8. Acute new onset atrial fibrillation: Monitor with increased exertion. 9. H. pylori positive. Completed triple regimen therapy and then maintain Protonix twice daily 10. Bilateral lower extremity knee pain. Arthrocentesis of both knees. Fluid analysis no evidence of infection but urate crystals diagnostic gouty arthritis. Continue allopurinol. No colchicine due to AKI             12/30- pain R medial knee, con't allopurinol and tylenol scheduled.  Monitor with increased activity 11. Hypothyroidism: Synthroid 12. Decreased nutritional storage. Dietary follow-up 13. AKI superimposed on CKD stage III.  12/30- Cr down to 1.0- con't to monitor. 14. History of breast cancer in 1999 treated with lumpectomy radiation as well as tamoxifen. Follow-up outpatient 15. History of lung cancer. Status post partial upper lobe lobectomy 2006. Follow-up outpatient             12/30- will monitor- sats decreased somewhat 16. Anemia of chronic disease/pancytopenia. Patient has been seen  by hematology services outpatient. 12/30- Hb 8.0 down from 8.4- Plts 160s- con't to monitor- no SQ Heparin for now due to pancytopenia will monitor. 17. Low albumin: encourage intake of high protein foods.      LOS: 2 days A FACE TO FACE EVALUATION WAS PERFORMED  Clide Deutscher Ailin Rochford 11/01/2020, 11:34 AM

## 2020-11-01 NOTE — Progress Notes (Signed)
Physical Therapy Session Note  Patient Details  Name: Lisa [name] MRN: [id number] Date of Birth: [name] 14, 1934  Today's Date: 11/01/2020 PT Individual Time:Session1: 4174-0814; Session2: 1300-1400 PT Individual Time Calculation (min): 28 min & 60 min  Short Term Goals: Week 1:  PT Short Term Goal 1 (Week 1): Patient to perform transfers with min A. PT Short Term Goal 2 (Week 1): Patient to ambulate 76' with RW and min A. PT Short Term Goal 3 (Week 1): Patient to negotiate 8 short steps with rails and min A  Skilled Therapeutic Interventions/Progress Updates:    Session1: Patient seated in recliner and reports knee pain improved since resting and pain medication, but was 7/10 earlier today when up with OT.  Patient seated for LE therex including ankle pumps, heel slides (x 5), SAQ w/ 5 sec hold x 10.  Patient sit to stand with mod A from low seat mod cues for hand placement.  Patient ambulated in the room with min A using RW x 40' including 1 turn.  Sit to stand to RW then standing without UE support x 30 sec with close S flexed posture.  When cued to stand erect, pt with posterior LOB min A to recover.  Patient seated for armchair push ups x 5, rows with orange t-band and triceps extension with orange t-band.  Left seated in recliner with call bell in reach and chair alarm set.   Session2: Patient in recliner as RN leaving the room.  Reports had cream rubbed on her knees.  Patient sit to stand mod A and stand step to w/c with RW and min A.  Propelled w/c x 130' with close S slow pace.  Performed standing balance activity with 1 to 2 intermittent UE support tapping one foot to numbered disc on the floor. Standing for horseshoe toss 2 x 5 shoes with 1 UE support and CGA.  Patient ambulated x 75' with CGA with RW, c/o increased R LE pain.  Patient negotiated 3 steps with rails and min A.  Seated edge of w/c with feet on the floor to toss/catch ball to rebounder 2 x 20 reps.  Patient performed 2 x 10  reps on Kinetron @ 45 cm/sec while in w/c.  Assisted to room in w/c.  Patient stand step to recliner mod A with RW.  Patient left with legs elevated, chair alarm on and needs in reach.   Therapy Documentation Precautions:  Precautions Precautions: Fall Precaution Comments: unsteady knees Restrictions Weight Bearing Restrictions: No Pain: Pain Assessment Pain Scale: 0-10 Pain Score: 2  Faces Pain Scale: Hurts little more Pain Type: Acute pain Pain Location: Knee Pain Orientation: Right;Left Pain Descriptors / Indicators: Aching;Sore Pain Frequency: Constant Pain Onset: With Activity Pain Intervention(s): Repositioned;Ambulation/increased activity      Therapy/Group: Individual Therapy  [name]  Magda Kiel, PT 11/01/2020, 11:00 AM

## 2020-11-01 NOTE — Progress Notes (Signed)
Patient ID: Lisa [name], female   DOB: 1934-01-15, 86 y.o.   MRN: [id number]  [place] with grandson who is interested in [facility] [place] and getting one complete for pt while here. Will leave packet in her room and ask MD/PA to write order for this.

## 2020-11-02 ENCOUNTER — Encounter (HOSPITAL_COMMUNITY): Payer: Medicare Other | Admitting: Occupational Therapy

## 2020-11-02 ENCOUNTER — Inpatient Hospital Stay (HOSPITAL_COMMUNITY): Payer: Medicare Other

## 2020-11-02 ENCOUNTER — Inpatient Hospital Stay (HOSPITAL_COMMUNITY): Payer: Medicare Other | Admitting: Occupational Therapy

## 2020-11-02 DIAGNOSIS — N1831 Chronic kidney disease, stage 3a: Secondary | ICD-10-CM

## 2020-11-02 DIAGNOSIS — R0989 Other specified symptoms and signs involving the circulatory and respiratory systems: Secondary | ICD-10-CM

## 2020-11-02 DIAGNOSIS — I4891 Unspecified atrial fibrillation: Secondary | ICD-10-CM

## 2020-11-02 DIAGNOSIS — D638 Anemia in other chronic diseases classified elsewhere: Secondary | ICD-10-CM

## 2020-11-02 DIAGNOSIS — E871 Hypo-osmolality and hyponatremia: Secondary | ICD-10-CM

## 2020-11-02 NOTE — Progress Notes (Signed)
Physical Therapy Session Note  Patient Details  Name: Lisa [name] MRN: [id number] Date of Birth: 27-Nov-1933  Today's Date: 11/02/2020 PT Individual Time: 1000-1045 PT Individual Time Calculation (min): 45 min   Short Term Goals: Week 1:  PT Short Term Goal 1 (Week 1): Patient to perform transfers with min A. PT Short Term Goal 2 (Week 1): Patient to ambulate 28' with RW and min A. PT Short Term Goal 3 (Week 1): Patient to negotiate 8 short steps with rails and min A  Skilled Therapeutic Interventions/Progress Updates:     Pt received seated in recliner, awake and agreeable to PT session. She reports improved R knee discomfort and endorses no resting pain on arrival. Focus of session on BLE strengthening, functional transfer training, gait training, and standing balance. She performed stand<>pivot transfer with min/modA and no AD from recliner to w/c with cues for sequencing, stepping pattern, and general technique. She was transported in w/c with totalA for time management from her room to day room gym and performed an additional stand<>pivot with similar techinique as listed above to mat table. From there, she completed the following seated there-ex: -1x20 BLE LAQ with 3# ankle weight -1x20 knee extension isometric holds for x5 seconds with 3# ankle weight -2x3 repeated sit<>stands with mostly MINA (a few required modA) * cues for increasing forward weight shift and this helped out greatly with her rising transition  Sit<>stand with minA from mat table height to RW with cues for hand placement to mat table and exxagerating her forward weight shift (nose over toes cues). Performed static standing marching for x34minute with CGA and RW prior to onset of fatigue with request to sit. Then performed gait training where she ambulated ~67ft with CGA and RW. Pt very pleased with this and also endorses no to very minimal knee pain. After seated rest break, she repeated this gait training with similar  fashion, ambulating 25ft this time with CGA and RW. Cues for forward gaze, erect posture, and increased bilateral step length. Pt was returned to her room in her w/c with totalA for time management and she remained seated in chair with needs in reach. Pt pleased with today's session.  Therapy Documentation Precautions:  Precautions Precautions: Fall Precaution Comments: unsteady knees Restrictions Weight Bearing Restrictions: No  Therapy/Group: Individual Therapy  Lakshmi Sundeen P Ineze Serrao PT 11/02/2020, 7:48 AM

## 2020-11-02 NOTE — Progress Notes (Signed)
[facility] PHYSICAL MEDICINE & REHABILITATION PROGRESS NOTE   Subjective/Complaints: Patient seen sitting up in her chair this morning.  She states she slept well overnight.  She states therapies are going well.  She denies complaints.  ROS: Denies CP, SOB, N/V/D  Objective:   No results found. Recent Labs    10/31/20 0509  WBC 5.7  HGB 8.0*  HCT 27.2*  PLT 156   Recent Labs    10/31/20 0509  NA 134*  K 4.3  CL 99  CO2 26  GLUCOSE 106*  BUN 16  CREATININE 1.03*  CALCIUM 9.0    Intake/Output Summary (Last 24 hours) at 11/02/2020 1642 Last data filed at 11/02/2020 1310 Gross per 24 hour  Intake 820 ml  Output -  Net 820 ml        Physical Exam: Vital Signs Blood pressure (!) 109/51, pulse 92, temperature 97.7 F (36.5 C), temperature source Oral, resp. rate 14, height 5\' 7"  (1.702 m), weight 93.2 kg, SpO2 97 %. Constitutional: No distress . Vital signs reviewed.  Obese. HENT: Normocephalic.  Atraumatic. Eyes: EOMI. No discharge. Cardiovascular: No JVD.  RRR. Respiratory: Normal effort.  No stridor.  Bilateral clear to auscultation. GI: Non-distended.  BS +. Skin: Warm and dry.  Intact. Psych: Normal mood.  Normal behavior. Musc: No edema in extremities.  No tenderness in extremities. Neuro: Alert HOH Bilateral upper extremities: 5/5 proximal distal  Bilateral lower extremities: Hip flexion: 4-4+/5, distally 5/5, unchanged  Assessment/Plan: 1. Functional deficits which require 3+ hours per day of interdisciplinary therapy in a comprehensive inpatient rehab setting.  Physiatrist is providing close team supervision and 24 hour management of active medical problems listed below.  Physiatrist and rehab team continue to assess barriers to discharge/monitor patient progress toward functional and medical goals  Care Tool:  Bathing    Body parts bathed by patient: Right arm,Left arm,Chest,Abdomen,Front perineal area,Right upper leg,Left upper leg,Face,Buttocks    Body parts bathed by helper: Right lower leg,Left lower leg     Bathing assist Assist Level: Minimal Assistance - Patient > 75%     Upper Body Dressing/Undressing Upper body dressing   What is the patient wearing?: Pull over shirt    Upper body assist Assist Level: Supervision/Verbal cueing    Lower Body Dressing/Undressing Lower body dressing      What is the patient wearing?: Pants,Incontinence brief     Lower body assist Assist for lower body dressing: Moderate Assistance - Patient 50 - 74%     Toileting Toileting    Toileting assist Assist for toileting: Moderate Assistance - Patient 50 - 74%     Transfers Chair/bed transfer  Transfers assist     Chair/bed transfer assist level: Moderate Assistance - Patient 50 - 74%     Locomotion Ambulation   Ambulation assist      Assist level: Minimal Assistance - Patient > 75% Assistive device: Walker-rolling Max distance: 40'   Walk 10 feet activity   Assist     Assist level: Minimal Assistance - Patient > 75% Assistive device: Walker-rolling   Walk 50 feet activity   Assist Walk 50 feet with 2 turns activity did not occur: Safety/medical concerns         Walk 150 feet activity   Assist Walk 150 feet activity did not occur: Safety/medical concerns         Walk 10 feet on uneven surface  activity   Assist Walk 10 feet on uneven surfaces activity did not occur:  Safety/medical concerns         Wheelchair     Assist Will patient use wheelchair at discharge?: No Type of Wheelchair: Manual    Wheelchair assist level: Supervision/Verbal cueing Max wheelchair distance: 130'    Wheelchair 50 feet with 2 turns activity    Assist        Assist Level: Supervision/Verbal cueing   Wheelchair 150 feet activity     Assist      Assist Level: Maximal Assistance - Patient 25 - 49% (pt able to propel 70 feet)   Blood pressure (!) 109/51, pulse 92, temperature 97.7 F  (36.5 C), temperature source Oral, resp. rate 14, height 5\' 7"  (1.702 m), weight 93.2 kg, SpO2 97 %.    Medical Problem List and Plan: 1. Debility secondary to AKI superimposed on CKD stage III/hyponatremia/gout/atrial fibrillation  Continue CIR 2. Antithrombotics: -DVT/anticoagulation: SCDs -antiplatelet therapy: N/A 3. Pain Management: Tylenol as needed            Controlled on 1/1 4. Mood: Provide emotional support -antipsychotic agents: N/A 5. Neuropsych: This patient is capable of making decisions on her own behalf. 6. Skin/Wound Care: Routine skin checks 7. Fluids/Electrolytes/Nutrition:  8. Acute new onset atrial fibrillation: Monitor with increased exertion.  Controlled on 1/1 9. H. pylori positive. Completed triple regimen therapy and then maintain Protonix twice daily 10. Bilateral lower extremity knee pain. Arthrocentesis of both knees. Fluid analysis no evidence of infection but urate crystals diagnostic gouty arthritis. Continue allopurinol. No colchicine due to AKI             Improving Monitor with increased activity 11. Hypothyroidism: Synthroid 12. Decreased nutritional storage. Dietary follow-up 13. AKI superimposed on CKD stage IIIa.  Creatinine 1.03 on 12/30, improving. 14. History of breast cancer in 1999 treated with lumpectomy radiation as well as tamoxifen. Follow-up outpatient 15. History of lung cancer. Status post partial upper lobe lobectomy 2006. Follow-up outpatient 16. Anemia of chronic disease/pancytopenia. Patient has been seen by hematology services outpatient. Hemoglobin 8.0 on 12/30, labs ordered for Monday 17. Low albumin: encourage intake of high protein foods.  18.  Labile blood pressure  Labile on 1/1, monitor trend 19.  Hyponatremia  Sodium 134 on 12/30, labs ordered for Monday   LOS: 3 days A FACE TO FACE EVALUATION WAS PERFORMED  Lisa [name] Lorie Phenix 11/02/2020, 4:42 PM

## 2020-11-02 NOTE — Progress Notes (Signed)
Occupational Therapy Session Note  Patient Details  Name: Lisa [name] MRN: [id number] Date of Birth: 25-Oct-1934  Today's Date: 11/02/2020 OT Group Time: 1100-1200 OT Group Time Calculation (min): 60 min   Skilled Therapeutic Interventions/Progress Updates:    Pt engaged in therapeutic w/c level dance group focusing on patient choice, UE/LE strengthening, salience, activity tolerance, and social participation. Pt was guided through various dance-based exercises involving UEs/LEs and trunk. All music was selected by group members. Emphasis placed on activity tolerance and general strengthening. Pt participated well while seated, declined standing, and took rest breaks as needed. At end of session RT escorted pt back to her room.    Therapy Documentation Precautions:  Precautions Precautions: Fall Precaution Comments: unsteady knees Restrictions Weight Bearing Restrictions: No Vital Signs: Therapy Vitals Temp: 97.7 F (36.5 C) Temp Source: Oral Pulse Rate: 92 Resp: 14 BP: (!) 109/51 Patient Position (if appropriate): Sitting Oxygen Therapy SpO2: 97 % O2 Device: Room Air Pain: no s/s pain during session   ADL: ADL Grooming: Setup Upper Body Bathing: Supervision/safety Where Assessed-Upper Body Bathing: Sitting at sink Lower Body Bathing: Minimal assistance Where Assessed-Lower Body Bathing: Sitting at sink,Standing at sink Upper Body Dressing: Setup Where Assessed-Upper Body Dressing: Sitting at sink Lower Body Dressing: Minimal assistance Where Assessed-Lower Body Dressing: Standing at sink,Sitting at sink Toileting: Moderate assistance Toilet Transfer: Moderate assistance      Therapy/Group: Group Therapy  Skyelar Halliday A Kerria Sapien 11/02/2020, 3:43 PM

## 2020-11-02 NOTE — Progress Notes (Signed)
Occupational Therapy Session Note  Patient Details  Name: Lisa [name] MRN: [id number] Date of Birth: 14-Jul-1934  Today's Date: 11/02/2020 OT Individual Time: 1250-1400 OT Individual Time Calculation (min): 70 min    Short Term Goals: Week 1:  OT Short Term Goal 1 (Week 1): Pt will perform 3/3 toilet tasks with CGA OT Short Term Goal 2 (Week 1): Pt will consistently perform sit to stand at RW with CGA for LB ADL OT Short Term Goal 3 (Week 1): Pt will perform LB dress with compensatory techniques PRN with CGA OT Short Term Goal 4 (Week 1): pt will perform activity/ADL for 10+ mins without fatigue  Skilled Therapeutic Interventions/Progress Updates: Patient stated that her bilateral knees were sore and that she was too fatigued to complete therapy but elected to work on seated enduranced before she stated she needed to use the toilet.   She participated as follows:  Sit to stand= extra time, CGA, and verbal and tactile cues for technique to scoot forward to front of surface of recliner and to push up from service to increase leverage.   Toilet transfer recliner to elevated toilet seat via RW= close S  Toileting= close S and extra time to complete BM and she thoroughly cleansed her bottom seated on toilet afterwards  Endurance activities and exercises seated in recliner.   Patient tolerated multiple seated exercises to increase upper body strength to improve her sitto stand and dynamic balance and functional mobility for self care and other tasks and interests.     Continue OT plan of care to help patient increase independence and endurnace of self care and IADLs actitivities, goals, and other preferred activities and tasks.  Therapy Documentation Precautions:  Precautions Precautions: Fall Precaution Comments: unsteady knees Restrictions Weight Bearing Restrictions: No General: General OT Amount of Missed Time: 5 Minutes (5)  Pain:denied    Therapy/Group: Individual  Therapy  Alfredia Ferguson [facility] 11/02/2020, 2:58 PM

## 2020-11-03 NOTE — Progress Notes (Signed)
[place] PHYSICAL MEDICINE & REHABILITATION PROGRESS NOTE   Subjective/Complaints: Patient seen sitting up in her chair this morning.  She states she slept well overnight, however per report patient open chair in dependent position majority of the night.  Patient has questions regarding lower extremity edema, which is minimal, however again patient noted to be in dependent position majority of the night as well as this AM, educated patient.  She asks if she can ambulate independently with rolling walker, discussed safety with patient.  ROS: Denies CP, SOB, N/V/D  Objective:   No results found. No results for input(s): WBC, HGB, HCT, PLT in the last 72 hours. No results for input(s): NA, K, CL, CO2, GLUCOSE, BUN, CREATININE, CALCIUM in the last 72 hours.  Intake/Output Summary (Last 24 hours) at 11/03/2020 0936 Last data filed at 11/03/2020 0725 Gross per 24 hour  Intake 1320 ml  Output --  Net 1320 ml        Physical Exam: Vital Signs Blood pressure 137/81, pulse 89, temperature 98.7 F (37.1 C), temperature source Oral, resp. rate 16, height 5\' 7"  (1.702 m), weight 93.2 kg, SpO2 92 %. Constitutional: No distress . Vital signs reviewed.  Obese. HENT: Normocephalic.  Atraumatic. Eyes: EOMI. No discharge. Cardiovascular: No JVD.  RRR. Respiratory: Normal effort.  No stridor.  Bilateral clear to auscultation. GI: Non-distended.  BS +. Skin: Warm and dry.  Intact. Psych: Normal mood.  Normal behavior. Musc: Mild lower extreme edema No tenderness in extremities. Neuro: Alert HOH Bilateral upper extremities: 5/5 proximal distal  Bilateral lower extremities: Hip flexion: 4-4+/5, distally 5/5, stable  Assessment/Plan: 1. Functional deficits which require 3+ hours per day of interdisciplinary therapy in a comprehensive inpatient rehab setting.  Physiatrist is providing close team supervision and 24 hour management of active medical problems listed below.  Physiatrist and rehab  team continue to assess barriers to discharge/monitor patient progress toward functional and medical goals  Care Tool:  Bathing    Body parts bathed by patient: Right arm,Left arm,Chest,Abdomen,Front perineal area,Right upper leg,Left upper leg,Face,Buttocks   Body parts bathed by helper: Right lower leg,Left lower leg     Bathing assist Assist Level: Minimal Assistance - Patient > 75%     Upper Body Dressing/Undressing Upper body dressing   What is the patient wearing?: Pull over shirt    Upper body assist Assist Level: Supervision/Verbal cueing    Lower Body Dressing/Undressing Lower body dressing      What is the patient wearing?: Pants,Incontinence brief     Lower body assist Assist for lower body dressing: Moderate Assistance - Patient 50 - 74%     Toileting Toileting    Toileting assist Assist for toileting: Moderate Assistance - Patient 50 - 74%     Transfers Chair/bed transfer  Transfers assist     Chair/bed transfer assist level: Moderate Assistance - Patient 50 - 74%     Locomotion Ambulation   Ambulation assist      Assist level: Minimal Assistance - Patient > 75% Assistive device: Walker-rolling Max distance: 40'   Walk 10 feet activity   Assist     Assist level: Minimal Assistance - Patient > 75% Assistive device: Walker-rolling   Walk 50 feet activity   Assist Walk 50 feet with 2 turns activity did not occur: Safety/medical concerns         Walk 150 feet activity   Assist Walk 150 feet activity did not occur: Safety/medical concerns  Walk 10 feet on uneven surface  activity   Assist Walk 10 feet on uneven surfaces activity did not occur: Safety/medical concerns         Wheelchair     Assist Will patient use wheelchair at discharge?: No Type of Wheelchair: Manual    Wheelchair assist level: Supervision/Verbal cueing Max wheelchair distance: 130'    Wheelchair 50 feet with 2 turns  activity    Assist        Assist Level: Supervision/Verbal cueing   Wheelchair 150 feet activity     Assist      Assist Level: Maximal Assistance - Patient 25 - 49% (pt able to propel 70 feet)   Blood pressure 137/81, pulse 89, temperature 98.7 F (37.1 C), temperature source Oral, resp. rate 16, height 5\' 7"  (1.702 m), weight 93.2 kg, SpO2 92 %.    Medical Problem List and Plan: 1. Debility secondary to AKI superimposed on CKD stage III/hyponatremia/gout/atrial fibrillation  Continue CIR  Magnesium level ordered for tomorrow per family request 2. Antithrombotics: -DVT/anticoagulation: SCDs -antiplatelet therapy: N/A 3. Pain Management: Tylenol as needed            Controlled on 1/2 4. Mood: Provide emotional support -antipsychotic agents: N/A 5. Neuropsych: This patient is capable of making decisions on her own behalf. 6. Skin/Wound Care: Routine skin checks 7. Fluids/Electrolytes/Nutrition:  8. Acute new onset atrial fibrillation: Monitor with increased exertion.  Controlled on 1/1 9. H. pylori positive. Completed triple regimen therapy and then maintain Protonix twice daily 10. Bilateral lower extremity knee pain. Arthrocentesis of both knees. Fluid analysis no evidence of infection but urate crystals diagnostic gouty arthritis. Continue allopurinol. No colchicine due to AKI             Improving Monitor with increased activity 11. Hypothyroidism: Synthroid 12. Decreased nutritional storage. Dietary follow-up 13. AKI superimposed on CKD stage IIIa.  Creatinine 1.03 on 12/30, labs ordered for tomorrow 14. History of breast cancer in 1999 treated with lumpectomy radiation as well as tamoxifen. Follow-up outpatient 15. History of lung cancer. Status post partial upper lobe lobectomy 2006. Follow-up outpatient 16. Anemia of chronic disease/pancytopenia. Patient has been seen by hematology services  outpatient. Hemoglobin 8.0 on 12/30, labs ordered for tomorrow 17. Low albumin: encourage intake of high protein foods.  18.  Labile blood pressure  Relatively controlled on 1/2 19.  Hyponatremia  Sodium 134 on 12/30, labs ordered for tomorrow   LOS: 4 days A FACE TO FACE EVALUATION WAS PERFORMED  Lakisha Peyser Lorie Phenix 11/03/2020, 9:36 AM

## 2020-11-03 NOTE — Progress Notes (Signed)
Patient still remains in recliner at this time. Pt denies pain and is not showing any signs of discomfort or distress. Pt states she is not ready to go back to bed. Pt declines SCDs at this time. Pt educated on the importance of wearing SCDs to prevent blood clots. Will continue to educate patient.

## 2020-11-03 NOTE — Progress Notes (Signed)
Patient remained in recliner majority of the shift. Pt asked multiple times if she was ready to go back to bed. Pt declined and stated "I will let you know when Im ready to go back to bed". Pt returned back to bed around 0200. Pt states her feet are swelling and that she does not want to wear SCDs at this time. Pt educated about sitting in a dependent position for an extended period of time and wearing her SCDs to prevent blood clots. Pt states she understands but still does not want to wear her SCDs. Pt requested to sit back up in chair. Pt placed back in chair per her request, with lower extremities elevated.

## 2020-11-03 NOTE — Progress Notes (Signed)
Patient sitting up in recliner upon initial assessment. Patient is alert and oriented x 4. VS are stable. Pain medication provided per patients request. Asked patient if she was ready to go to bed. Pt declined and stated she wanted to sit up in chair and would let me know when she is ready to go bed.

## 2020-11-04 ENCOUNTER — Inpatient Hospital Stay (HOSPITAL_COMMUNITY): Payer: Medicare Other | Admitting: Occupational Therapy

## 2020-11-04 ENCOUNTER — Inpatient Hospital Stay (HOSPITAL_COMMUNITY): Payer: Medicare Other

## 2020-11-04 LAB — CBC WITH DIFFERENTIAL/PLATELET
Abs Immature Granulocytes: 0.32 10*3/uL — ABNORMAL HIGH (ref 0.00–0.07)
Basophils Absolute: 0 10*3/uL (ref 0.0–0.1)
Basophils Relative: 1 %
Eosinophils Absolute: 0 10*3/uL (ref 0.0–0.5)
Eosinophils Relative: 0 %
HCT: 27.3 % — ABNORMAL LOW (ref 36.0–46.0)
Hemoglobin: 8.5 g/dL — ABNORMAL LOW (ref 12.0–15.0)
Immature Granulocytes: 10 %
Lymphocytes Relative: 30 %
Lymphs Abs: 1 10*3/uL (ref 0.7–4.0)
MCH: 30.5 pg (ref 26.0–34.0)
MCHC: 31.1 g/dL (ref 30.0–36.0)
MCV: 97.8 fL (ref 80.0–100.0)
Monocytes Absolute: 1.5 10*3/uL — ABNORMAL HIGH (ref 0.1–1.0)
Monocytes Relative: 48 %
Neutro Abs: 0.3 10*3/uL — CL (ref 1.7–7.7)
Neutrophils Relative %: 11 %
Platelets: 108 10*3/uL — ABNORMAL LOW (ref 150–400)
RBC: 2.79 MIL/uL — ABNORMAL LOW (ref 3.87–5.11)
RDW: 17.9 % — ABNORMAL HIGH (ref 11.5–15.5)
WBC: 3.2 10*3/uL — ABNORMAL LOW (ref 4.0–10.5)
nRBC: 3.5 % — ABNORMAL HIGH (ref 0.0–0.2)

## 2020-11-04 LAB — BASIC METABOLIC PANEL
Anion gap: 8 (ref 5–15)
BUN: 7 mg/dL — ABNORMAL LOW (ref 8–23)
CO2: 26 mmol/L (ref 22–32)
Calcium: 9.1 mg/dL (ref 8.9–10.3)
Chloride: 98 mmol/L (ref 98–111)
Creatinine, Ser: 0.99 mg/dL (ref 0.44–1.00)
GFR, Estimated: 56 mL/min — ABNORMAL LOW (ref >60)
Glucose, Bld: 91 mg/dL (ref 70–99)
Potassium: 4 mmol/L (ref 3.5–5.1)
Sodium: 132 mmol/L — ABNORMAL LOW (ref 135–145)

## 2020-11-04 LAB — MAGNESIUM: Magnesium: 1.3 mg/dL — ABNORMAL LOW (ref 1.7–2.4)

## 2020-11-04 MED ORDER — MAGNESIUM OXIDE 400 (241.3 MG) MG PO TABS
800.0000 mg | ORAL_TABLET | Freq: Two times a day (BID) | ORAL | Status: DC
  Administered 2020-11-04 – 2020-11-13 (×19): 800 mg via ORAL
  Filled 2020-11-04 (×19): qty 2

## 2020-11-04 MED ORDER — PANTOPRAZOLE SODIUM 40 MG PO TBEC
40.0000 mg | DELAYED_RELEASE_TABLET | Freq: Two times a day (BID) | ORAL | Status: AC
  Administered 2020-11-04 – 2020-11-07 (×7): 40 mg via ORAL
  Filled 2020-11-04 (×8): qty 1

## 2020-11-04 MED ORDER — PANTOPRAZOLE SODIUM 40 MG PO TBEC
40.0000 mg | DELAYED_RELEASE_TABLET | Freq: Every day | ORAL | Status: DC
  Administered 2020-11-08 – 2020-11-13 (×6): 40 mg via ORAL
  Filled 2020-11-04 (×6): qty 1

## 2020-11-04 MED ORDER — TRAZODONE HCL 50 MG PO TABS
50.0000 mg | ORAL_TABLET | Freq: Every day | ORAL | Status: DC
  Administered 2020-11-04 – 2020-11-12 (×9): 50 mg via ORAL
  Filled 2020-11-04 (×9): qty 1

## 2020-11-04 NOTE — Progress Notes (Signed)
Physical Therapy Session Note  Patient Details  Name: Lisa [name] MRN: [id number] Date of Birth: [birth year]  Today's Date: 11/04/2020 PT Individual Time: 6378-5885 PT Individual Time Calculation (min): 56 min   Short Term Goals: Week 1:  PT Short Term Goal 1 (Week 1): Patient to perform transfers with min A. PT Short Term Goal 2 (Week 1): Patient to ambulate 16' with RW and min A. PT Short Term Goal 3 (Week 1): Patient to negotiate 8 short steps with rails and min A  Skilled Therapeutic Interventions/Progress Updates:    Patient in recliner in room and reports has had a busy day, able to report events in previous sessions.  Performed sit to stand with CGA to RW cues for hand placement.  She ambulated with RW and minA to CGA to therapy gym 156' with one stop, but declined to sit in w/c.  Patient performed seated therex for UE's with 3# bar: shoulder flexion and horizontal abduction/adduction with hand over hand A for technique.  Patient seated for ball toss/catch for balance and core activation.  Sit to stand to RW CGA and ambulated to parallel bars with RW and CGA mod cues for transitioning into bars.  Standing balance no UE support to give/get ball on opposite sides rotating trunk to give and retrieve ball with mod cues for posture.  Patient needing rest with UE support on bars after about every 5-6 reps, close S to CGA for balance.  Ambulated in bars with 1 UE support x 2 lengths of bars with CGA.  Seated rest on mat.  Patient ambulated with SPC x 40' with min A and mod cues for posture. Standing balance tossing horseshoes x 6 x 3 reps with focus on balance, posture and sit<>stand technique.  Patient ambulated toward room x about 17' with RW and CGA, requesting to ride in w/c the rest of the way back to room.  Stand step to recliner min A with RW.  Left with legs elevated, chair alarm active and call bell/needs in reach.   Therapy Documentation Precautions:  Precautions Precautions:  Fall Precaution Comments: unsteady knees Restrictions Weight Bearing Restrictions: No Pain: Pain Assessment Pain Score: 7  Pain Type: Chronic pain Pain Location: Knee Pain Orientation: Right;Left Pain Descriptors / Indicators: Sore Pain Onset: With Activity Pain Intervention(s): Rest    Therapy/Group: Individual Therapy  [name]  [place], PT 11/04/2020, 4:07 PM

## 2020-11-04 NOTE — Progress Notes (Signed)
Patient stated she was wide awake and not tired at all. Refused to get in the bed. Said she wanted to stay in the recliner. Call bell within reach.

## 2020-11-04 NOTE — Progress Notes (Signed)
Occupational Therapy Session Note  Patient Details  Name: Lisa [name] MRN: [id number] Date of Birth: [birth year]  Today's Date: 11/04/2020 OT Individual Time: 2297-9892 OT Individual Time Calculation (min): 70 min    Short Term Goals: Week 1:  OT Short Term Goal 1 (Week 1): Pt will perform 3/3 toilet tasks with CGA OT Short Term Goal 2 (Week 1): Pt will consistently perform sit to stand at RW with CGA for LB ADL OT Short Term Goal 3 (Week 1): Pt will perform LB dress with compensatory techniques PRN with CGA OT Short Term Goal 4 (Week 1): pt will perform activity/ADL for 10+ mins without fatigue  Skilled Therapeutic Interventions/Progress Updates:    Pt greeted at time of session sitting up in recliner agreeable to OT session, had questions about purpose of OT/POC which were answered. Pt declined ADL, stating she didn't need to wash up and that her clothes were still clean but did need to use the bathroom. Ambulated w/c > toiltet with CGA after Min A for sit to stand with cues for forward weight shift and hand placement, transferred to/from toilet Min A. Pt had small BM, Min A for clothing management but able to perform hygiene seated. Ambulated back to wheelchair with CGA after sit to stand from toilet, set up at sink and performed orla hygiene and face washing with set up. Transported dependently for time management to shower room and performed TTB transfer to/from shower with Min A for help to maneuver BLEs over edge of tub. Cues throughout all transfers for staying in RW and hand placement. Also performed SCIFIT seated on level 2.5 for 10 minutes total, switching directions half way through. Stand pivot w/c > recliner Min A and set up with alarm on, call bell in reach.    Therapy Documentation Precautions:  Precautions Precautions: Fall Precaution Comments: unsteady knees Restrictions Weight Bearing Restrictions: No     Therapy/Group: Individual Therapy  Viona Gilmore 11/04/2020,  7:55 AM

## 2020-11-04 NOTE — Progress Notes (Addendum)
[place] PHYSICAL MEDICINE & REHABILITATION PROGRESS NOTE   Subjective/Complaints:  Pt reports slept OK, but told night nurse didn't sleep at all, and was awake all night-  Can't get comfortable in hospital".   LBM today.  Walked to bathroom today for first time.     ROS:  Pt denies SOB, abd pain, CP, N/V/C/D, and vision changes   Objective:   No results found. Recent Labs    11/04/20 0621  WBC 3.2*  HGB 8.5*  HCT 27.3*  PLT 108*   Recent Labs    11/04/20 0621  NA 132*  K 4.0  CL 98  CO2 26  GLUCOSE 91  BUN 7*  CREATININE 0.99  CALCIUM 9.1    Intake/Output Summary (Last 24 hours) at 11/04/2020 1004 Last data filed at 11/04/2020 0718 Gross per 24 hour  Intake 960 ml  Output --  Net 960 ml        Physical Exam: Vital Signs Blood pressure (!) 165/87, pulse 96, temperature 98.6 F (37 C), resp. rate 18, height '5\' 7"'  (1.702 m), weight 93.2 kg, SpO2 94 %. Constitutional: sitting on toilet having BM, NAD, OT in room HENT: Normocephalic.  Atraumatic. Eyes: EOMI. No discharge. Cardiovascular: RRR Respiratory: CTA B/L- no W/R/R- good air movement GI: Soft, NT, ND, (+)BS  Skin: Warm and dry.  Intact. Psych: appropriate Musc: Mild lower extreme edema No tenderness in extremities. Neuro: Alert HOH Bilateral upper extremities: 5/5 proximal distal  Bilateral lower extremities: Hip flexion: 4-4+/5, distally 5/5, stable  Assessment/Plan: 1. Functional deficits which require 3+ hours per day of interdisciplinary therapy in a comprehensive inpatient rehab setting.  Physiatrist is providing close team supervision and 24 hour management of active medical problems listed below.  Physiatrist and rehab team continue to assess barriers to discharge/monitor patient progress toward functional and medical goals  Care Tool:  Bathing    Body parts bathed by patient: Right arm,Left arm,Chest,Abdomen,Front perineal area,Right upper leg,Left upper leg,Face,Buttocks    Body parts bathed by helper: Right lower leg,Left lower leg     Bathing assist Assist Level: Minimal Assistance - Patient > 75%     Upper Body Dressing/Undressing Upper body dressing   What is the patient wearing?: Pull over shirt    Upper body assist Assist Level: Supervision/Verbal cueing    Lower Body Dressing/Undressing Lower body dressing      What is the patient wearing?: Pants,Incontinence brief     Lower body assist Assist for lower body dressing: Moderate Assistance - Patient 50 - 74%     Toileting Toileting    Toileting assist Assist for toileting: Minimal Assistance - Patient > 75%     Transfers Chair/bed transfer  Transfers assist     Chair/bed transfer assist level: Moderate Assistance - Patient 50 - 74%     Locomotion Ambulation   Ambulation assist      Assist level: Minimal Assistance - Patient > 75% Assistive device: Walker-rolling Max distance: 40'   Walk 10 feet activity   Assist     Assist level: Minimal Assistance - Patient > 75% Assistive device: Walker-rolling   Walk 50 feet activity   Assist Walk 50 feet with 2 turns activity did not occur: Safety/medical concerns         Walk 150 feet activity   Assist Walk 150 feet activity did not occur: Safety/medical concerns         Walk 10 feet on uneven surface  activity   Assist Walk 10 feet  on uneven surfaces activity did not occur: Safety/medical concerns         Wheelchair     Assist Will patient use wheelchair at discharge?: No Type of Wheelchair: Manual    Wheelchair assist level: Supervision/Verbal cueing Max wheelchair distance: 130'    Wheelchair 50 feet with 2 turns activity    Assist        Assist Level: Supervision/Verbal cueing   Wheelchair 150 feet activity     Assist      Assist Level: Maximal Assistance - Patient 25 - 49% (pt able to propel 70 feet)   Blood pressure (!) 165/87, pulse 96, temperature 98.6 F (37 C),  resp. rate 18, height '5\' 7"'  (1.702 m), weight 93.2 kg, SpO2 94 %.    Medical Problem List and Plan: 1. Debility secondary to AKI superimposed on CKD stage III/hyponatremia/gout/atrial fibrillation  Continue CIR  Magnesium level ordered for tomorrow per family request 2. Antithrombotics: -DVT/anticoagulation: SCDs -antiplatelet therapy: N/A 3. Pain Management: Tylenol as needed            Controlled on 1/2 4. Mood: Provide emotional support -antipsychotic agents: N/A 5. Neuropsych: This patient is capable of making decisions on her own behalf. 6. Skin/Wound Care: Routine skin checks 7. Fluids/Electrolytes/Nutrition:  8. Acute new onset atrial fibrillation: Monitor with increased exertion.  Controlled on 1/1 9. H. pylori positive. Completed triple regimen therapy and then maintain Protonix twice daily 10. Bilateral lower extremity knee pain. Arthrocentesis of both knees. Fluid analysis no evidence of infection but urate crystals diagnostic gouty arthritis. Continue allopurinol. No colchicine due to AKI             1/3- says knee pain still there, but tylenol helps.  Monitor with increased activity 11. Hypothyroidism: Synthroid 12. Decreased nutritional storage. Dietary follow-up 13. AKI superimposed on CKD stage IIIa.  Creatinine 1.03 on 12/30, labs ordered for tomorrow  1/3- Cr 0.99- doing better- con't to monitor 14. History of breast cancer in 1999 treated with lumpectomy radiation as well as tamoxifen. Follow-up outpatient 15. History of lung cancer. Status post partial upper lobe lobectomy 2006. Follow-up outpatient 16. Anemia of chronic disease/pancytopenia. Patient has been seen by hematology services outpatient. Hemoglobin 8.0 on 12/30, labs ordered for tomorrow 17. Low albumin: encourage intake of high protein foods.  18.  Labile blood pressure  Relatively controlled on 1/2 19.   Hyponatremia  Sodium 134 on 12/30, labs ordered for tomorrow 20. Hypomagnesemia  12/3- will replete Mg 800 mg BID for Mg of 1.3- K+ is OK- and recheck labs in AM 21. ANC critically low  1/3- will Consult HemeOnc- esp with her hx of Cancer x2- will check Iron, TIBC, ferritin, folate and Vit B12- and daily CBC with diff per Oncology- Dr Lorenso Courier and then if they are normal, migh tneed bone marrow biopsy.  22. Insomnia  1/3- write for trazodone for sleep.   I spent a total of 40 minutes on total care today, calling Heme/Onc and speaking with PA- >50% coordination of care as above.    LOS: 5 days A FACE TO FACE EVALUATION WAS PERFORMED  Leandre Wien 11/04/2020, 10:04 AM

## 2020-11-04 NOTE — Progress Notes (Signed)
Patient was awake majority of the night. States she may have gotten an hour of sleep. Offered to help her to bed, still refused. She said she does not feel comfortable in the hospital and that is why she can not sleep. Says she does not feel tired despite the lack of sleep. Will let oncoming shift know that patient would like something ordered to help her sleep.  Patient currently sitting in recliner with legs propped up, call bell within reach.

## 2020-11-04 NOTE — Progress Notes (Signed)
Occupational Therapy Session Note  Patient Details  Name: Lisa [name] MRN: [id number] Date of Birth: May 05, 1934  Today's Date: 11/04/2020 OT Individual Time: 8241-7530 OT Individual Time Calculation (min): 45 min    Short Term Goals: Week 1:  OT Short Term Goal 1 (Week 1): Pt will perform 3/3 toilet tasks with CGA OT Short Term Goal 2 (Week 1): Pt will consistently perform sit to stand at RW with CGA for LB ADL OT Short Term Goal 3 (Week 1): Pt will perform LB dress with compensatory techniques PRN with CGA OT Short Term Goal 4 (Week 1): pt will perform activity/ADL for 10+ mins without fatigue  Skilled Therapeutic Interventions/Progress Updates:    Pt received in recliner and ready for therapy. Pt stated she has not been sleeping but she demonstrated quite a bit of energy and was very enthusiastic about therapy.  Discussed her painful knees and practiced mobility using her arms more to take pressure off her knees.  Instead of having one hand on RW and other hand on chair to push up, encouraged her to push up with B hands to rise to stand. This worked well and she was able to stand from recliner with CGA 3x and then arm chair in the room 4x with only S.   For arm strengthening, used chair pushups and AROM of shoulders. She does have some difficulty with her coordination with following exercises but otherwise she moves her arms in a functional manner.  Pt resting in recliner with chair alarm on and all needs met.  Therapy Documentation Precautions:  Precautions Precautions: Fall Precaution Comments: unsteady knees Restrictions Weight Bearing Restrictions: No    Vital Signs: Oxygen Therapy SpO2: 94 % O2 Device: Room Air Pain: Pain Assessment Pain Scale: 0-10 Pain Score: 5  Pain Type: Chronic pain Pain Location: Knee Pain Orientation: Right;Left Pain Descriptors / Indicators: Aching Pain Onset: With Activity Pain Intervention(s): Rest ADL: ADL Grooming: Setup Upper Body  Bathing: Supervision/safety Where Assessed-Upper Body Bathing: Sitting at sink Lower Body Bathing: Minimal assistance Where Assessed-Lower Body Bathing: Sitting at sink,Standing at sink Upper Body Dressing: Setup Where Assessed-Upper Body Dressing: Sitting at sink Lower Body Dressing: Minimal assistance Where Assessed-Lower Body Dressing: Standing at sink,Sitting at sink Toileting: Moderate assistance Toilet Transfer: Moderate assistance   Therapy/Group: Individual Therapy  Emmarose Klinke 11/04/2020, 11:09 AM

## 2020-11-04 NOTE — Progress Notes (Signed)
Physical Therapy Session Note  Patient Details  Name: Lisa [name] MRN: [id number] Date of Birth: 03/10/1934  Today's Date: 11/04/2020 PT Individual Time: 1224-8250 PT Individual Time Calculation (min): 24 min   Short Term Goals: Week 1:  PT Short Term Goal 1 (Week 1): Patient to perform transfers with min A. PT Short Term Goal 2 (Week 1): Patient to ambulate 86' with RW and min A. PT Short Term Goal 3 (Week 1): Patient to negotiate 8 short steps with rails and min A  Skilled Therapeutic Interventions/Progress Updates:    Pt received sitting in recliner, agreeable to PT session. No reports of pain but reports LE swelling. Encouraged mobility and elevating legs to assist with managing. She participated in seated there-ex with music incorporated to encourage motivation and effort. She performed several exercises with therapist guiding technique and muscle activation, including heel raises, ankle pumps, LAQ, hip marches, arm raises, bicep curls, arm punches, and shoulder shrugs. She also completed 1x5 sit<>stands with CGA and no AD from w/c height. Performed stand<>pivot transfer with minA and no AD from EOB to w/c. Focused remainder of session on ambulation where she ambulated ~31ft with CGA and RW in hallways, cues for reducing downward gaze, increasing step length, and relaxing her shoulders. Pt without LOB or knee buckling throughout and noted mild shortness of breath after completion. She ended session seated in recliner with chair alarm on and BLE elevated, needs within reach. Pt pleased with her performance this session.  Therapy Documentation Precautions:  Precautions Precautions: Fall Precaution Comments: unsteady knees Restrictions Weight Bearing Restrictions: No  Therapy/Group: Individual Therapy  Brick Ketcher P Rylee Huestis PT 11/04/2020, 9:54 AM

## 2020-11-05 ENCOUNTER — Inpatient Hospital Stay (HOSPITAL_COMMUNITY): Payer: Medicare Other

## 2020-11-05 ENCOUNTER — Inpatient Hospital Stay (HOSPITAL_COMMUNITY): Payer: Medicare Other | Admitting: Occupational Therapy

## 2020-11-05 LAB — CBC WITH DIFFERENTIAL/PLATELET
Abs Immature Granulocytes: 0.16 10*3/uL — ABNORMAL HIGH (ref 0.00–0.07)
Basophils Absolute: 0 10*3/uL (ref 0.0–0.1)
Basophils Relative: 0 %
Eosinophils Absolute: 0 10*3/uL (ref 0.0–0.5)
Eosinophils Relative: 0 %
HCT: 30.6 % — ABNORMAL LOW (ref 36.0–46.0)
Hemoglobin: 8.9 g/dL — ABNORMAL LOW (ref 12.0–15.0)
Immature Granulocytes: 6 %
Lymphocytes Relative: 38 %
Lymphs Abs: 1 10*3/uL (ref 0.7–4.0)
MCH: 29.3 pg (ref 26.0–34.0)
MCHC: 29.1 g/dL — ABNORMAL LOW (ref 30.0–36.0)
MCV: 100.7 fL — ABNORMAL HIGH (ref 80.0–100.0)
Monocytes Absolute: 1.2 10*3/uL — ABNORMAL HIGH (ref 0.1–1.0)
Monocytes Relative: 47 %
Neutro Abs: 0.2 10*3/uL — CL (ref 1.7–7.7)
Neutrophils Relative %: 9 %
Platelets: 92 10*3/uL — ABNORMAL LOW (ref 150–400)
RBC: 3.04 MIL/uL — ABNORMAL LOW (ref 3.87–5.11)
RDW: 18.8 % — ABNORMAL HIGH (ref 11.5–15.5)
WBC: 2.6 10*3/uL — ABNORMAL LOW (ref 4.0–10.5)
nRBC: 2.3 % — ABNORMAL HIGH (ref 0.0–0.2)

## 2020-11-05 LAB — BASIC METABOLIC PANEL
Anion gap: 8 (ref 5–15)
BUN: 12 mg/dL (ref 8–23)
CO2: 29 mmol/L (ref 22–32)
Calcium: 9.1 mg/dL (ref 8.9–10.3)
Chloride: 98 mmol/L (ref 98–111)
Creatinine, Ser: 1.04 mg/dL — ABNORMAL HIGH (ref 0.44–1.00)
GFR, Estimated: 53 mL/min — ABNORMAL LOW (ref >60)
Glucose, Bld: 84 mg/dL (ref 70–99)
Potassium: 4 mmol/L (ref 3.5–5.1)
Sodium: 135 mmol/L (ref 135–145)

## 2020-11-05 LAB — MAGNESIUM: Magnesium: 1.4 mg/dL — ABNORMAL LOW (ref 1.7–2.4)

## 2020-11-05 LAB — FOLATE: Folate: 13.5 ng/mL (ref >5.9)

## 2020-11-05 LAB — IRON AND TIBC
Iron: 70 ug/dL (ref 28–170)
Saturation Ratios: 30 % (ref 10.4–31.8)
TIBC: 235 ug/dL — ABNORMAL LOW (ref 250–450)
UIBC: 165 ug/dL

## 2020-11-05 LAB — VITAMIN B12: Vitamin B-12: 529 pg/mL (ref 180–914)

## 2020-11-05 LAB — FERRITIN: Ferritin: 750 ng/mL — ABNORMAL HIGH (ref 11–307)

## 2020-11-05 NOTE — Patient Care Conference (Signed)
Inpatient RehabilitationTeam Conference and Plan of Care Update Date: 11/05/2020   Time: 11:47 AM    Patient Name: Lisa [name]      Medical Record Number: [id number]  Date of Birth: [birth year] Sex: Female         Room/Bed: 4M07C/4M07C-01 Payor Info: Payor: MEDICARE / Plan: MEDICARE PART A AND B / Product Type: *No Product type* /    Admit Date/Time:  10/30/2020  3:05 PM  Primary Diagnosis:  [facility] Problems: Principal Problem:   Debility Active Problems:   Hyponatremia   Labile blood pressure    Expected Discharge Date: Expected Discharge Date: 11/12/20  Team Members Present: Physician leading conference: Dr. Courtney Heys Care Coodinator Present: Dorien Chihuahua, RN, BSN, CRRN;Becky Dupree, LCSW Nurse Present: Pamella Pert) [place], LPN PT Present: Magda Kiel, PT OT Present: Lillia Corporal, OT PPS Coordinator present : Gunnar Fusi, Novella [place], PT     Current Status/Progress Goal Weekly Team Focus  Bowel/Bladder   Continent of B/B  Remain Continent  Assess Q Shift and PRN   Swallow/Nutrition/ Hydration             ADL's   Min A LB bathe/dress, bathes at sink level (does not like full showers), UB ADLs set up/supervision, toilet transfers Min/CGA (safety with RW) and CGA for hygiene  Supervision overall  sit to stands, RW safety during ADL, LB ADLs with AE PRN, funcitonal transfers, endurance/tolerance   Mobility   min A transfers, and ambulation up to 156' with RW; S bed mobility, mod A 3 (3") steps with rails  S overall, except CGA car transfer  LE and postural strength, balance, ambulation, control knee pain   Communication             Safety/Cognition/ Behavioral Observations            Pain   No pain reported.  Pain <3  Assess QShift and PRN   Skin   Skin intact  Remain intact.  Assess Qshift and PRN     Discharge Planning:  Home with family coming up with a 24/7 plan. Son to come from [facility] to stay for one month to assist. Grandson to go  back to [place]   Team Discussion: Medical issues per MD with abrupt drop in neutrophil level; needs bone marrow biopsy for diagnosis of cause. Insomnia addressed per MD and CREAT level improved. Knee pain (gout) improved.  Patient on target to meet rehab goals: yes, currently supervision for standing, toileting, etc. CGA overall with close supervision for ADLs and CGA for toileting. CGA- min assist for transfers and min assist for steps  *See Care Plan and progress notes for long and short-term goals.   Revisions to Treatment Plan:  Car transfers, Stair management  Teaching Needs: Transfers, toileting, medications, etc. Requires cues for safety, hand placement on the walker  Current Barriers to Discharge: Decreased caregiver support, Home enviroment access/layout, Behavior and Anxiety and internal distractions 5 step entry to home  Possible Resolutions to Barriers: Family education     Medical Summary Current Status: continent; no pain per pt; LBM 1/4; grandson not sure about bone marrow biopsy- CKD Stage IIIa; eating better; 75% of meals now  Barriers to Discharge: Decreased family/caregiver support;Weight;Nutrition means;Other (comments);Medical stability;Home enviroment access/layout  Barriers to Discharge Comments: needs bone marrow biopsy- will try to do before d/c. - needs cues; is CGA currently for OT; PT- CGA-min A- 156 ft RW CGA; Possible Resolutions to [employer]: will need supervision- at  home by d/c- Mg 1.4- on MgOx 800 mg BID- slowed processing- but family thinks at baseline; anxiety higher-1/11 Tuesday d/c.   Continued Need for Acute Rehabilitation Level of Care: The patient requires daily medical management by a physician with specialized training in physical medicine and rehabilitation for the following reasons: Direction of a multidisciplinary physical rehabilitation program to maximize functional independence : Yes Medical management of patient stability for  increased activity during participation in an intensive rehabilitation regime.: Yes Analysis of laboratory values and/or radiology reports with any subsequent need for medication adjustment and/or medical intervention. : Yes   I attest that I was present, lead the team conference, and concur with the assessment and plan of the team.   Dorien Chihuahua B 11/05/2020, 4:16 PM

## 2020-11-05 NOTE — Progress Notes (Signed)
Physical Therapy Session Note  Patient Details  Name: Lisa [name] MRN: [id number] Date of Birth: 01-Dec-1933  Today's Date: 11/05/2020 PT Individual Time:Session1: 5465-0354; Session2:1415-1511 PT Individual Time Calculation (min): 57 min & 56 min  Short Term Goals: Week 1:  PT Short Term Goal 1 (Week 1): Patient to perform transfers with min A. PT Short Term Goal 2 (Week 1): Patient to ambulate 9' with RW and min A. PT Short Term Goal 3 (Week 1): Patient to negotiate 8 short steps with rails and min A  Skilled Therapeutic Interventions/Progress Updates:   Session1: Patient in recliner and reports got up to wash up earlier this morning.  Sit to stand to RW with min A from recliner with min cues for hand placement.  Patient ambulated x 157' with RW with CGA.  Patient negotiated 4 steps with rails and min A.  Patient assisted in w/c to dayroom.  Standing balance tapping alternate feet to cones with UE support and CGA.  Stepping over cones with RW and min A.  Patient performed LE therex as noted below.  Assisted onto NuStep and pt performed 3 minutes at level 1 with UE/LE in small ROM to prevent knee pain.  Patient assisted back to w/c with RW and CGA cues for positioning prior to sitting.  Then pushed in w/c to room, assisted to recliner with RW and CGA.  Patient's grandson in the room and discussed issues and progress.  Patient left with chair alarm active, call bell in hand and grandson in the room.   [place]:  Patient seated in recliner and reports soreness in legs, but no pain.  Performed sit to stand with min A and transferred to w/c with RW and CGA.  Pushed w/c x 120' with S and then assisted into elevator.  Patient ambulated in gift shop in busy enviornment with RW and CGA/ min A cues for posture, direction, safety with turns in small spaces. Patient assisted back to unit in w/c in elevator.  Performed stand pivot to mat from w/c with CGA and cues.  Patient sit to supine on wedge with S.   Performed supine SLR 2 x 5 reps, bridging 2 x 5 reps, hooklying hip abduction with orange t-band x 10.  Supine to sit with S.  Patient sit to stand to RW to transfer to w/c.  Seated for UE therex with 3# bar for bicep curls, shoulder flexion and horizontal abduction x 10 each.  Transported in w/c to ortho gym and performed car transfer to simulated midsize SUV height with min A helping L leg into car.  Patient assisted back to w/c with RW and CGA.  Assisted to room in w/c and back to recliner with CGA.  Left with legs elevated and call light/needs in reach and chair alarm activated.   Therapy Documentation Precautions:  Precautions Precautions: Fall Precaution Comments: unsteady knees Restrictions Weight Bearing Restrictions: No Pain: Pain Assessment Pain Scale: 0-10 Pain Score: 7  Pain Type: Chronic pain Pain Location: Knee Pain Orientation: Right;Left Pain Descriptors / Indicators: Sore Pain Onset: With Activity Pain Intervention(s): Repositioned;Rest   Exercises: General Exercises - Lower Extremity Long Arc Quad: Strengthening;Both;10 reps;Seated;Weights (2 sets w/ 3# weights) Hip Flexion/Marching: Strengthening;Both;Seated;10 reps (2 sets w/ 3# weights) Total Joint Exercises Hip ABduction/ADduction: Strengthening;Both;Standing (2 x 10 w/ 3# weights) Standing Hip Extension: Strengthening;Both;10 reps;Standing (2 x 10 w/ 3# weights) Other Exercises Other Exercises: Seated trunk extension hands crossed over chest for core strengthening 2 x 10   Therapy/Group:  Individual Therapy  [name]  [place], PT 11/05/2020, 10:02 AM

## 2020-11-05 NOTE — Progress Notes (Signed)
Pt will be having bone marrow biopsy tomorrow. Her grandson was concerned about an NPO status prior to procedure. Spoke with CT pt will be NPO starting at midnight, no order for diet change noted. Will report to night nurse regarding NPO status at 0000.   Dayna Ramus

## 2020-11-05 NOTE — Progress Notes (Signed)
[facility] PHYSICAL MEDICINE & REHABILITATION PROGRESS NOTE   Subjective/Complaints:  Pt is eating well per pt- >75% of meals.   Lisa [name]- is medical resident- was concerned about phos level- needed Phos before came ot rehab- also asking about functional status- went over that and when to change PPI to qday- will be 1/7.   Lab wise, Mg up slightly to 1.4; Na up to 135, and WBC down to 2.6 with ANC 0.2. Plts down to 92k as well  ROS:   Pt denies SOB, abd pain, CP, N/V/C/D, and vision changes   Objective:   No results found. Recent Labs    11/04/20 0621 11/05/20 0514  WBC 3.2* 2.6*  HGB 8.5* 8.9*  HCT 27.3* 30.6*  PLT 108* 92*   Recent Labs    11/04/20 0621 11/05/20 0514  NA 132* 135  K 4.0 4.0  CL 98 98  CO2 26 29  GLUCOSE 91 84  BUN 7* 12  CREATININE 0.99 1.04*  CALCIUM 9.1 9.1    Intake/Output Summary (Last 24 hours) at 11/05/2020 0912 Last data filed at 11/05/2020 6767 Gross per 24 hour  Intake 840 ml  Output --  Net 840 ml        Physical Exam: Vital Signs Blood pressure (!) 122/59, pulse 92, temperature 98.5 F (36.9 C), resp. rate 20, height 5' 7" (1.702 m), weight 93.2 kg, SpO2 (!) 89 %. Constitutional: sitting up at bedside table, OT in room, NAD- spoke to grandson in hall outside room.  HENT: Normocephalic.  Atraumatic. Eyes: EOMI. No discharge. Cardiovascular: irregular, but rate controlled Respiratory: CTA B/L- no W/R/R- good air movement GI: Soft, NT, ND, (+)BS - hyperactive Skin: Warm and dry.  Intact. Psych: appropriate- brighter affect- wants to go home Musc: Mild lower extreme edema No tenderness in extremities. Neuro: Alert HOH Bilateral upper extremities: 5/5 proximal distal  Bilateral lower extremities: Hip flexion: 4-4+/5, distally 5/5, stable  Assessment/Plan: 1. Functional deficits which require 3+ hours per day of interdisciplinary therapy in a comprehensive inpatient rehab setting.  Physiatrist is providing close team  supervision and 24 hour management of active medical problems listed below.  Physiatrist and rehab team continue to assess barriers to discharge/monitor patient progress toward functional and medical goals  Care Tool:  Bathing    Body parts bathed by patient: Right arm,Left arm,Chest,Abdomen,Front perineal area,Right upper leg,Left upper leg,Face,Buttocks   Body parts bathed by helper: Right lower leg,Left lower leg     Bathing assist Assist Level: Minimal Assistance - Patient > 75%     Upper Body Dressing/Undressing Upper body dressing   What is the patient wearing?: Pull over shirt    Upper body assist Assist Level: Supervision/Verbal cueing    Lower Body Dressing/Undressing Lower body dressing      What is the patient wearing?: Pants,Incontinence brief     Lower body assist Assist for lower body dressing: Moderate Assistance - Patient 50 - 74%     Toileting Toileting    Toileting assist Assist for toileting: Minimal Assistance - Patient > 75%     Transfers Chair/bed transfer  Transfers assist     Chair/bed transfer assist level: Moderate Assistance - Patient 50 - 74%     Locomotion Ambulation   Ambulation assist      Assist level: Minimal Assistance - Patient > 75% Assistive device: Walker-rolling Max distance: 156'   Walk 10 feet activity   Assist     Assist level: Minimal Assistance - Patient > 75% Assistive  device: Walker-rolling   Walk 50 feet activity   Assist Walk 50 feet with 2 turns activity did not occur: Safety/medical concerns  Assist level: Minimal Assistance - Patient > 75% Assistive device: Walker-rolling    Walk 150 feet activity   Assist Walk 150 feet activity did not occur: Safety/medical concerns  Assist level: Minimal Assistance - Patient > 75% Assistive device: Walker-rolling    Walk 10 feet on uneven surface  activity   Assist Walk 10 feet on uneven surfaces activity did not occur: Safety/medical  concerns         Wheelchair     Assist Will patient use wheelchair at discharge?: No Type of Wheelchair: Manual    Wheelchair assist level: Supervision/Verbal cueing Max wheelchair distance: 130'    Wheelchair 50 feet with 2 turns activity    Assist        Assist Level: Supervision/Verbal cueing   Wheelchair 150 feet activity     Assist      Assist Level: Maximal Assistance - Patient 25 - 49% (pt able to propel 70 feet)   Blood pressure (!) 122/59, pulse 92, temperature 98.5 F (36.9 C), resp. rate 20, height 5' 7" (1.702 m), weight 93.2 kg, SpO2 (!) 89 %.    Medical Problem List and Plan: 1. Debility secondary to AKI superimposed on CKD stage III/hyponatremia/gout/atrial fibrillation  Continue CIR  1/4- doing ADLs min A and walking 100+ ft with RW min A per therapy- will arrange for d/c date likely later this week.  2. Antithrombotics: -DVT/anticoagulation: SCDs -antiplatelet therapy: N/A 3. Pain Management: Tylenol as needed            Controlled on 1/2 4. Mood: Provide emotional support -antipsychotic agents: N/A 5. Neuropsych: This patient is capable of making decisions on her own behalf. 6. Skin/Wound Care: Routine skin checks 7. Fluids/Electrolytes/Nutrition:  8. Acute new onset atrial fibrillation: Monitor with increased exertion.  1/4- rate controlled- con't regimen 9. H. pylori positive. Completed triple regimen therapy and then maintain Protonix twice daily 10. Bilateral lower extremity knee pain. Arthrocentesis of both knees. Fluid analysis no evidence of infection but urate crystals diagnostic gouty arthritis. Continue allopurinol. No colchicine due to AKI             1/3- says knee pain still there, but tylenol helps.  Monitor with increased activity 11. Hypothyroidism: Synthroid 12. Decreased nutritional storage. Dietary follow-up 13. AKI superimposed on CKD stage IIIa.   Creatinine 1.03 on 12/30, labs ordered for tomorrow  1/4- Cr 1.04- stable- con't regimen 14. History of breast cancer in 1999 treated with lumpectomy radiation as well as tamoxifen. Follow-up outpatient 15. History of lung cancer. Status post partial upper lobe lobectomy 2006. Follow-up outpatient 16. Anemia of chronic disease/pancytopenia. Patient has been seen by hematology services outpatient. Hemoglobin 8.0 on 12/30, labs ordered for tomorrow 17. Low albumin: encourage intake of high protein foods.  18.  Labile blood pressure  Relatively controlled on 1/2 19.  Hyponatremia  Sodium 134 on 12/30, labs ordered for tomorrow  1/4- Na up to 135- from 122 at admission- con't regimen 20. Hypomagnesemia  12/3- will replete Mg 800 mg BID for Mg of 1.3- K+ is OK- and recheck labs in AM 21. ANC critically low  1/3- will Consult HemeOnc- esp with her hx of Cancer x2- will check Iron, TIBC, ferritin, folate and Vit B12- and daily CBC with diff per Oncology- Dr Lorenso Courier and then if they are normal, migh tneed bone marrow biopsy.  1/4- spoke with grandson- they were planning to do bone marrow biopsy before admission "in the next few weeks"- labs checking iron, etc were OK- will likely need Bone marrow biopsy- but pt would rather do outpt- she wants to go home. D/w grandson, Lisa [name]- they've been thinking MDS for 2+ years, but hadn't done bone marrow biopsy yet.  22. Insomnia  1/3- write for trazodone for sleep.  1/4- slept MUCH better   I spent a total of 35 minutes on total care today- calling Heme/onc again as well as talking x2 to grandson- >50% on coordination of care.    LOS: 6 days A FACE TO FACE EVALUATION WAS PERFORMED  Lisa [name] 11/05/2020, 9:12 AM

## 2020-11-05 NOTE — Progress Notes (Signed)
Patient stated she slept well after she requested to go to bed at midnight. Patient sitting in recliner with call light within reach.

## 2020-11-05 NOTE — Progress Notes (Signed)
Patient ID: Lisa [name], female   DOB: 1934-08-05, 87 y.o.   MRN: [id number]  Met with pt and grandson who is in the room to discuss team conference goals supervision level and target discharge of 1/11. Her son from [facility] has [place] so may not be coming up here. [name] said they are working on the 24/7 care. She does live with her daughter who works third shift so is there some. MD was there to discuss medical issues with grandson. Will order equipment and follow up therapies both agreeable to this. Continue to work on discharge needs.

## 2020-11-05 NOTE — Progress Notes (Signed)
Occupational Therapy Session Note  Patient Details  Name: Lisa [name] MRN: [id number] Date of Birth: 1934/03/23  Today's Date: 11/05/2020 OT Individual Time: 5701-7793 OT Individual Time Calculation (min): 60 min    Short Term Goals: Week 1:  OT Short Term Goal 1 (Week 1): Pt will perform 3/3 toilet tasks with CGA OT Short Term Goal 2 (Week 1): Pt will consistently perform sit to stand at RW with CGA for LB ADL OT Short Term Goal 3 (Week 1): Pt will perform LB dress with compensatory techniques PRN with CGA OT Short Term Goal 4 (Week 1): pt will perform activity/ADL for 10+ mins without fatigue  Skilled Therapeutic Interventions/Progress Updates:    pt greeted at time of session sitting up in wheelchair agreeable to OT session, mild knee stiffness which is typical for this pt but no pain. Agreeable to ADL before grandson is supposed to visit. Upon returning with supplied, grandson was present and had questions about CLOF which were answered. Also had several questions and recommended speak with MD. Pt ambulated chair <> bathroom CGA with RW, sit to stand in same manner after cues for hand placement and to keep walker close to her. Performed UB/LB bathing seated on commode with CGA overall, able to reach buttocks and BLEs but declined washing feet today. UB dress set up and LB dress for underwear with pad and pants with CGA as well, able to forward weight shift to thread pants. Once back in recliner, donned new socks with legs elevated Min A. Therapist reviewed recommendations for Supervision during ambulation and ADL with grandson, also demonstrated TTB transfer and pt/family do want this for home use. Pt up in recliner with alarm on call bell in reach.  Therapy Documentation Precautions:  Precautions Precautions: Fall Precaution Comments: unsteady knees Restrictions Weight Bearing Restrictions: No     Therapy/Group: Individual Therapy  Viona Gilmore 11/05/2020, 12:29 PM

## 2020-11-05 NOTE — Consult Note (Signed)
Chief Complaint: Patient was seen in consultation today for Bone marrow biopsy at the request of Dr Dagoberto Ligas   Supervising Physician: Arne Cleveland  Patient Status: [facility] - In-pt  History of Present Illness: Lisa [name] is a 87 y.o. female   Hx Breast Ca 1999 Hx Lung Ca 2006 Gout newly on Colchicine and allupurinol AKI secondary ARB  Hx Neutropenia/ pancytopenia since 2017 Admitted 10/28/20- weakness; fatigue; nausea; wt loss Follows with [facility] [place]- Dr Delton Coombes  Work up extensive since admission  Anemia chronic disease Pancytopenia Concerning for myelodysplastic syndrome Dr Delton Coombes has been recommending BM bx MD wants done while IP   Scheduled for BM bx 1/5 in IR   Past Medical History:  Diagnosis Date  . Adenocarcinoma of left lung ([place]) 2006  . Arthritis   . Asthma   . Cancer of breast, female ([place])   . Cancer of lung ([place])   . Hypertension   . Hypothyroidism   . Invasive ductal carcinoma of left breast ([place]) 1999  . Neutropenia ([place]) 06/09/2016  . Personal history of radiation therapy     Past Surgical History:  Procedure Laterality Date  . BIOPSY  10/23/2020   Procedure: BIOPSY;  Surgeon: Yetta Flock, MD;  Location: [facility] ENDOSCOPY;  Service: Gastroenterology;;  . BREAST BIOPSY     left axillary node dissection  . BREAST LUMPECTOMY Left   . COLONOSCOPY  03/08/2003   XMI:WOEHOZYYQM rectal polyps destroyed with the tip of the snare/Polyps at hepatic flexure, splenic flexure at 35 cm/Left-sided diverticula: unable to retrieve path  . COLONOSCOPY  09/12/2008   GNO:IBBCWU rectum and distal sigmoid diminutive polyps/scattered left sided diverticulum. hyperplastic  . COLONOSCOPY N/A 03/06/2013   GQB:VQXIHWT polyp-removed as described above; colonic diverticulosis. hyperplastic polyps. next TCS 03/2018  . ESOPHAGOGASTRODUODENOSCOPY (EGD) WITH PROPOFOL N/A 10/23/2020   Procedure: ESOPHAGOGASTRODUODENOSCOPY (EGD) WITH PROPOFOL;  Surgeon:  Yetta Flock, MD;  Location: [place];  Service: Gastroenterology;  Laterality: N/A;  . FOOT SURGERY    . LUNG REMOVAL, PARTIAL     upper lobe    Allergies: Meloxicam and Micardis hct [telmisartan-hctz]  Medications: Prior to Admission medications   Medication Sig Start Date End Date Taking? Authorizing Provider  albuterol (PROVENTIL,VENTOLIN) 90 MCG/ACT inhaler Inhale 2 puffs into the lungs every 6 (six) hours as needed for wheezing or shortness of breath.    Everardo All, MD  allopurinol (ZYLOPRIM) 100 MG tablet Take 2 tablets (200 mg total) by mouth daily. 10/31/20 11/30/20  Kayleen Memos, DO  amoxicillin (AMOXIL) 500 MG capsule Take 2 capsules (1,000 mg total) by mouth every 12 (twelve) hours for 8 days. 10/30/20 11/07/20  Kayleen Memos, DO  clarithromycin (BIAXIN) 500 MG tablet Take 1 tablet (500 mg total) by mouth every 12 (twelve) hours for 8 days. 10/30/20 11/07/20  Kayleen Memos, DO  clobetasol ointment (TEMOVATE) 8.88 % Apply 1 application topically as needed (for irritation). 02/14/19   [provider]  ferrous sulfate 325 (65 FE) MG tablet Take 325 mg by mouth daily with breakfast.    [provider]  furosemide (LASIX) 20 MG tablet Take 0.5 tablets (10 mg total) by mouth daily. 10/31/20   Kayleen Memos, DO  Iron-FA-B Cmp-C-Biot-Probiotic (FUSION PLUS) CAPS Take 1 capsule by mouth daily.    [provider]  levothyroxine (SYNTHROID) 100 MCG tablet Take 1 tablet (100 mcg total) by mouth daily at 6 (six) AM. 10/31/20 01/29/21  Kayleen Memos, DO  metoprolol tartrate (LOPRESSOR) 25  MG tablet Take 0.5 tablets (12.5 mg total) by mouth 2 (two) times daily. 10/30/20 01/28/21  Kayleen Memos, DO  metroNIDAZOLE (FLAGYL) 500 MG tablet Take 1 tablet (500 mg total) by mouth every 12 (twelve) hours for 8 days. 10/30/20 11/07/20  Kayleen Memos, DO  Multiple Vitamin (MULTIVITAMIN WITH MINERALS) TABS tablet Take 1 tablet by mouth daily.    [provider]  pantoprazole (PROTONIX) 40 MG tablet Take 1 tablet (40 mg total) by mouth 2 (two) times daily for 7 days. 10/30/20 11/06/20  Kayleen Memos, DO  pantoprazole (PROTONIX) 40 MG tablet Take 1 tablet (40 mg total) by mouth daily. 11/06/20 02/04/21  Kayleen Memos, DO  polyethylene glycol (MIRALAX / GLYCOLAX) 17 g packet Take 17 g by mouth daily as needed for moderate constipation. 10/30/20   Hall, Carole N, DO  TRELEGY ELLIPTA 100-62.5-25 MCG/INH AEPB Inhale 1 puff into the lungs daily.  01/04/19   [provider]     Family History  Problem Relation Age of Onset  . Cancer Mother   . Cancer Sister   . Colon cancer Neg Hx     Social History   Socioeconomic History  . Marital status: Widowed    Spouse name: Not on file  . Number of children: Not on file  . Years of education: Not on file  . Highest education level: Not on file  Occupational History  . Not on file  Tobacco Use  . Smoking status: Former Smoker    Packs/day: 0.75    Years: 35.00    Pack years: 26.25    Quit date: 1993    Years since quitting: 29.0  . Smokeless tobacco: Never Used  . Tobacco comment: smoke-free X 30 yeras  Vaping Use  . Vaping Use: Never used  Substance and Sexual Activity  . Alcohol use: No  . Drug use: No  . Sexual activity: Not Currently  Other Topics Concern  . Not on file  Social History Narrative  . Not on file   Social Determinants of Health   Financial Resource Strain: Not on file  Food Insecurity: Not on file  Transportation Needs: Not on file  Physical Activity: Not on file  Stress: Not on file  Social Connections: Not on file    Review of Systems: A 12 point ROS discussed and pertinent positives are indicated in the HPI above.  All other systems are negative.  Review of Systems  Constitutional: Positive for activity change, appetite change, fatigue and unexpected weight change. Negative for fever.  HENT: Negative for sore throat and trouble swallowing.   Eyes: Negative  for visual disturbance.  Respiratory: Negative for cough and shortness of breath.   Cardiovascular: Negative for chest pain.  Gastrointestinal: Negative for abdominal pain.  Musculoskeletal: Positive for gait problem.  Neurological: Positive for weakness.  Psychiatric/Behavioral: Negative for behavioral problems and confusion.    Vital Signs: BP (!) 122/59 (BP Location: Left Arm)   Pulse 92   Temp 98.5 F (36.9 C)   Resp 20   Ht $R'5\' 7"'za$  (1.702 m)   Wt 205 lb 7.5 oz (93.2 kg)   SpO2 90%   BMI 32.18 kg/m   Physical Exam Vitals reviewed.  Cardiovascular:     Rate and Rhythm: Normal rate. Rhythm irregular.     Heart sounds: Normal heart sounds.  Pulmonary:     Breath sounds: Normal breath sounds.  Abdominal:     Palpations: Abdomen is soft.  Tenderness: There is no abdominal tenderness.  Musculoskeletal:        General: Normal range of motion.     Right lower leg: No edema.     Left lower leg: Edema present.  Skin:    General: Skin is warm.  Neurological:     Mental Status: She is alert and oriented to person, place, and time.  Psychiatric:        Behavior: Behavior normal.     Imaging: DG Chest 2 View  Result Date: 10/26/2020 CLINICAL DATA:  Shortness of breath EXAM: CHEST - 2 VIEW COMPARISON:  10/26/2020, 10:59 a.m. FINDINGS: Mild cardiomegaly. Unchanged small bilateral pleural effusions. The visualized skeletal structures are unremarkable. IMPRESSION: Mild cardiomegaly with unchanged small bilateral pleural effusions. No new airspace opacity. Electronically Signed   By: Eddie Candle M.D.   On: 10/26/2020 12:23   DG Chest 2 View  Result Date: 10/21/2020 CLINICAL DATA:  Weakness EXAM: CHEST - 2 VIEW COMPARISON:  05/31/2017 FINDINGS: Cardiac shadow is stable. Aortic calcifications are noted. The lungs are well aerated bilaterally. No focal infiltrate or sizable effusion is seen. No bony abnormality is noted. IMPRESSION: No acute abnormality noted. Electronically  Signed   By: Inez Catalina M.D.   On: 10/21/2020 13:59   DG Knee 1-2 Views Left  Result Date: 10/25/2020 CLINICAL DATA:  Knee pain, no known injury, initial encounter EXAM: LEFT KNEE - 2 VIEW COMPARISON:  None. FINDINGS: Tricompartmental degenerative changes are noted. Moderate joint effusion is noted similar to that seen on the right. No acute fracture or dislocation is noted. IMPRESSION: Degenerative change with joint effusion similar to that seen on the right side. Electronically Signed   By: Inez Catalina M.D.   On: 10/25/2020 13:01   DG Knee 1-2 Views Right  Result Date: 10/25/2020 CLINICAL DATA:  Bilateral knee pain EXAM: RIGHT KNEE - 2 VIEW COMPARISON:  None. FINDINGS: Tricompartmental degenerative changes are noted with lateral joint space narrowing. Moderate joint effusion is seen. No other soft tissue abnormality is seen. IMPRESSION: Degenerative change with joint effusion. Electronically Signed   By: Inez Catalina M.D.   On: 10/25/2020 13:00   DG Abd 1 View  Result Date: 10/26/2020 CLINICAL DATA:  Acute renal failure, abdominal distension EXAM: ABDOMEN - 1 VIEW COMPARISON:  CT abdomen and pelvis 05/31/2017 FINDINGS: Stool throughout colon, question containing retained contrast. Nonobstructive bowel gas pattern. No bowel dilatation or bowel wall thickening. Bones demineralized. Aneurysmal dilatation of the abdominal aorta, though unable to accurately assess size radiographically; patient has known abdominal aortic aneurysm and aortic tortuosity by prior CT. No definite urinary tract calcification. IMPRESSION: Nonobstructive bowel gas pattern. Aneurysmal dilatation of distal abdominal aorta though unable to at accurately assess size radiographically; patient has known abdominal aortic aneurysm by prior CT exam. Recommend ultrasound or CT assessment to assess interval change. Findings called to Dr. Clearence Ped on 10/26/2020 at 1905 hrs. Electronically Signed   By: Lavonia Dana M.D.   On:  10/26/2020 19:05   CT Head Wo Contrast  Result Date: 10/21/2020 CLINICAL DATA:  Altered level of consciousness EXAM: CT HEAD WITHOUT CONTRAST TECHNIQUE: Contiguous axial images were obtained from the base of the skull through the vertex without intravenous contrast. COMPARISON:  06/04/2005 FINDINGS: Brain: There are confluent hypodensities throughout the periventricular white matter, more pronounced since prior study, consistent with age-indeterminate small vessel ischemic changes. These are likely chronic. No other signs of acute infarct or hemorrhage. Lateral ventricles and remaining midline structures are unremarkable. No  acute extra-axial fluid collections. No mass effect. Vascular: No hyperdense vessel or unexpected calcification. Skull: Normal. Negative for fracture or focal lesion. Sinuses/Orbits: Minimal mucosal thickening within the sphenoid sinus. Remaining sinuses are clear. Other: None. IMPRESSION: 1. Diffuse hypodensities throughout the periventricular white matter, likely representing chronic small vessel ischemic changes. 2. Otherwise no acute intracranial process. Electronically Signed   By: Randa Ngo M.D.   On: 10/21/2020 15:01   CT CHEST ABDOMEN PELVIS W CONTRAST  Result Date: 10/24/2020 CLINICAL DATA:  Poor appetite, personal history of breast cancer and lung cancer EXAM: CT CHEST, ABDOMEN, AND PELVIS WITH CONTRAST TECHNIQUE: Multidetector CT imaging of the chest, abdomen and pelvis was performed following the standard protocol during bolus administration of intravenous contrast. CONTRAST:  119mL OMNIPAQUE IOHEXOL 300 MG/ML  SOLN COMPARISON:  06/13/2019 FINDINGS: CT CHEST FINDINGS Cardiovascular: The heart is mildly enlarged without pericardial effusion. Extensive atherosclerosis throughout the coronary vasculature. The aorta is normal in caliber. There is extensive atherosclerosis of the aortic arch, with moderate stenosis of the origin of the left subclavian artery.  Mediastinum/Nodes: Stable subcentimeter left hilar lymph nodes. No pathologic adenopathy. Thyroid, trachea, and esophagus are unremarkable. Lungs/Pleura: Stable postsurgical changes from partial left upper lobe resection. Upper lobe predominant emphysema. Bibasilar scarring. No airspace disease, effusion, or pneumothorax. Musculoskeletal: No acute or destructive bony lesions. Reconstructed images demonstrate no additional findings. CT ABDOMEN PELVIS FINDINGS Hepatobiliary: No focal liver abnormality is seen. No gallstones, gallbladder wall thickening, or biliary dilatation. Pancreas: Unremarkable. No pancreatic ductal dilatation or surrounding inflammatory changes. Spleen: Normal in size without focal abnormality. Adrenals/Urinary Tract: Adrenal glands are unremarkable. Kidneys are normal, without renal calculi, focal lesion, or hydronephrosis. Bladder is unremarkable. Stomach/Bowel: No bowel obstruction or ileus. Diverticulosis of the sigmoid colon without diverticulitis. Normal appendix right lower quadrant. No bowel wall thickening or inflammatory change. Vascular/Lymphatic: Infrarenal abdominal aortic aneurysm measures up to 4.8 cm in diameter, previously having measured 4.1 cm on 05/31/2017 exam. Significant atherosclerosis and mural thrombus throughout the aorta. No pathologic adenopathy. Reproductive: Uterus and bilateral adnexa are unremarkable. Other: No free fluid or free gas. No abdominal wall hernia. Musculoskeletal: No acute or destructive bony lesions. Reconstructed images demonstrate no additional findings. IMPRESSION: 1. Stable postsurgical changes from partial left upper lobe resection. 2. Infrarenal abdominal aortic aneurysm measures up to 4.8 cm in diameter, previously having measured 4.1 cm on 05/31/2017 exam. Recommend follow-up CT/MR every 6 months and vascular consultation. This recommendation follows ACR consensus guidelines: White Paper of the ACR Incidental Findings Committee II on Vascular  Findings. J Am Coll Radiol 2013; 10:789-794. 3. Diverticulosis without diverticulitis. 4. Aortic Atherosclerosis (ICD10-I70.0) and Emphysema (ICD10-J43.9). Electronically Signed   By: Randa Ngo M.D.   On: 10/24/2020 18:00   DG CHEST PORT 1 VIEW  Result Date: 10/26/2020 CLINICAL DATA:  Shortness of breath. EXAM: PORTABLE CHEST 1 VIEW COMPARISON:  10/21/2020 FINDINGS: Lordotic technique is demonstrated. Lungs are adequately inflated with hazy density over the left base/retrocardiac region which may be due to atelectasis/effusion versus prominent overlying soft tissues. Borderline stable cardiomegaly. Remainder of the exam is unchanged. IMPRESSION: Hazy density over the left base/retrocardiac region which may be due to atelectasis/effusion versus prominent overlying soft tissues. Consider PA and lateral chest x-ray for better evaluation of the left base. Electronically Signed   By: Marin Olp M.D.   On: 10/26/2020 11:05   ECHOCARDIOGRAM COMPLETE  Result Date: 10/22/2020    ECHOCARDIOGRAM REPORT   Patient Name:   Amika Keplinger Date of Exam: 10/22/2020  Medical Rec #:  [id number]      Height:       67.0 in Accession #:    [id number]     Weight:       191.0 lb Date of Birth:  [birth year]      BSA:          1.983 m Patient Age:    45 years       BP:           114/58 mmHg Patient Gender: F              HR:           61 bpm. Exam Location:  Inpatient Procedure: 2D Echo, Cardiac Doppler and Color Doppler Indications:    Atrial fibrillation  History:        Patient has no prior history of Echocardiogram examinations.                 Arrythmias:Atrial Fibrillation; Risk Factors:Hypertension.                 Breast and lung cancer, Renal failure.  Sonographer:    Dustin Flock Referring Phys: [place]  1. Left ventricular ejection fraction, by estimation, is 65 to 70%. The left ventricle has normal function. The left ventricle has no regional wall motion abnormalities. There is mild left  ventricular hypertrophy. Left ventricular diastolic parameters are consistent with Grade I diastolic dysfunction (impaired relaxation).  2. Prominent moderator band. Right ventricular systolic function is hyperdynamic. The right ventricular size is normal. There is moderately elevated pulmonary artery systolic pressure.  3. Left atrial size was moderately dilated.  4. The mitral valve is grossly normal. Trivial mitral valve regurgitation.  5. The aortic valve is tricuspid. Aortic valve regurgitation is not visualized. Mild aortic valve sclerosis is present, with no evidence of aortic valve stenosis.  6. The inferior vena cava is normal in size with greater than 50% respiratory variability, suggesting right atrial pressure of 3 mmHg. FINDINGS  Left Ventricle: Left ventricular ejection fraction, by estimation, is 65 to 70%. The left ventricle has normal function. The left ventricle has no regional wall motion abnormalities. The left ventricular internal cavity size was normal in size. There is  mild left ventricular hypertrophy. Left ventricular diastolic parameters are consistent with Grade I diastolic dysfunction (impaired relaxation). Indeterminate filling pressures. Right Ventricle: Prominent moderator band. The right ventricular size is normal. No increase in right ventricular wall thickness. Right ventricular systolic function is hyperdynamic. There is moderately elevated pulmonary artery systolic pressure. The tricuspid regurgitant velocity is 3.44 m/s, and with an assumed right atrial pressure of 3 mmHg, the estimated right ventricular systolic pressure is 08.6 mmHg. Left Atrium: Left atrial size was moderately dilated. Right Atrium: Right atrial size was normal in size. Pericardium: There is no evidence of pericardial effusion. Mitral Valve: The mitral valve is grossly normal. Trivial mitral valve regurgitation. Tricuspid Valve: The tricuspid valve is grossly normal. Tricuspid valve regurgitation is trivial.  Aortic Valve: The aortic valve is tricuspid. Aortic valve regurgitation is not visualized. Mild aortic valve sclerosis is present, with no evidence of aortic valve stenosis. Aortic valve peak gradient measures 15.1 mmHg. Pulmonic Valve: The pulmonic valve was grossly normal. Pulmonic valve regurgitation is trivial. Aorta: The aortic root and ascending aorta are structurally normal, with no evidence of dilitation. Venous: The inferior vena cava is normal in size with greater than 50% respiratory variability, suggesting right atrial pressure of 3 mmHg.  IAS/Shunts: No atrial level shunt detected by color flow Doppler.  LEFT VENTRICLE PLAX 2D LVIDd:         3.40 cm  Diastology LVIDs:         1.80 cm  LV e' medial:    5.66 cm/s LV PW:         1.10 cm  LV E/e' medial:  13.9 LV IVS:        1.20 cm  LV e' lateral:   8.27 cm/s LVOT diam:     2.10 cm  LV E/e' lateral: 9.5 LV SV:         64 LV SV Index:   32 LVOT Area:     3.46 cm  RIGHT VENTRICLE RV Basal diam:  3.00 cm RV S prime:     13.70 cm/s TAPSE (M-mode): 2.4 cm LEFT ATRIUM             Index       RIGHT ATRIUM           Index LA diam:        3.30 cm 1.66 cm/m  RA Area:     19.70 cm LA Vol (A2C):   92.9 ml 46.85 ml/m RA Volume:   57.50 ml  29.00 ml/m LA Vol (A4C):   81.6 ml 41.15 ml/m LA Biplane Vol: 89.9 ml 45.33 ml/m  AORTIC VALVE AV Area (Vmax): 1.56 cm AV Vmax:        194.00 cm/s AV Peak Grad:   15.1 mmHg LVOT Vmax:      87.60 cm/s LVOT Vmean:     57.100 cm/s LVOT VTI:       0.186 m  AORTA Ao Root diam: 2.60 cm MITRAL VALVE               TRICUSPID VALVE MV Area (PHT): 3.37 cm    TR Peak grad:   47.3 mmHg MV Decel Time: 225 msec    TR Vmax:        344.00 cm/s MV E velocity: 78.50 cm/s MV A velocity: 52.80 cm/s  SHUNTS MV E/A ratio:  1.49        Systemic VTI:  0.19 m                            Systemic Diam: 2.10 cm Lyman Bishop MD Electronically signed by Lyman Bishop MD Signature Date/Time: 10/22/2020/4:36:05 PM    Final    VAS [place] LOWER EXTREMITY VENOUS  (DVT)  Result Date: 10/23/2020  Lower Venous DVT Study Other Indications: BLE calf tenderness. Risk Factors: Hx of Cancer. Performing Technologist: Rogelia Rohrer  Examination Guidelines: A complete evaluation includes B-mode imaging, spectral Doppler, color Doppler, and power Doppler as needed of all accessible portions of each vessel. Bilateral testing is considered an integral part of a complete examination. Limited examinations for reoccurring indications may be performed as noted. The reflux portion of the exam is performed with the patient in reverse Trendelenburg.  +---------+---------------+---------+-----------+----------+--------------+ RIGHT    CompressibilityPhasicitySpontaneityPropertiesThrombus Aging +---------+---------------+---------+-----------+----------+--------------+ CFV      Full           Yes      Yes                                 +---------+---------------+---------+-----------+----------+--------------+ SFJ      Full                                                        +---------+---------------+---------+-----------+----------+--------------+  FV Prox  Full           Yes      Yes                                 +---------+---------------+---------+-----------+----------+--------------+ FV Mid   Full           Yes      Yes                                 +---------+---------------+---------+-----------+----------+--------------+ FV DistalFull           Yes      Yes                                 +---------+---------------+---------+-----------+----------+--------------+ PFV      Full                                                        +---------+---------------+---------+-----------+----------+--------------+ POP      Full           Yes      Yes                                 +---------+---------------+---------+-----------+----------+--------------+ PTV      Full                                                         +---------+---------------+---------+-----------+----------+--------------+ PERO     Full                                                        +---------+---------------+---------+-----------+----------+--------------+   +---------+---------------+---------+-----------+----------+--------------+ LEFT     CompressibilityPhasicitySpontaneityPropertiesThrombus Aging +---------+---------------+---------+-----------+----------+--------------+ CFV      Full           Yes      Yes                                 +---------+---------------+---------+-----------+----------+--------------+ SFJ      Full                                                        +---------+---------------+---------+-----------+----------+--------------+ FV Prox  Full           Yes      Yes                                 +---------+---------------+---------+-----------+----------+--------------+ FV Mid   Full  Yes      Yes                                 +---------+---------------+---------+-----------+----------+--------------+ FV DistalFull           Yes      Yes                                 +---------+---------------+---------+-----------+----------+--------------+ PFV      Full                                                        +---------+---------------+---------+-----------+----------+--------------+ POP      Full           Yes      Yes                                 +---------+---------------+---------+-----------+----------+--------------+ PTV      Full                                                        +---------+---------------+---------+-----------+----------+--------------+ PERO     Full                                                        +---------+---------------+---------+-----------+----------+--------------+     Summary: RIGHT: - There is no evidence of deep vein thrombosis in the lower extremity.  - A cystic structure is found  in the popliteal fossa.   *See table(s) above for measurements and observations. Electronically signed by Curt Jews MD on 10/23/2020 at 8:02:53 PM.    Final     Labs:  CBC: Recent Labs    10/29/20 0312 10/31/20 0509 11/04/20 0621 11/05/20 0514  WBC 4.1 5.7 3.2* 2.6*  HGB 8.4* 8.0* 8.5* 8.9*  HCT 27.8* 27.2* 27.3* 30.6*  PLT 162 156 108* 92*    COAGS: No results for input(s): INR, APTT in the last 8760 hours.  BMP: Recent Labs    12/19/19 1051 02/26/20 1049 07/09/20 1050 10/21/20 1123 10/30/20 0303 10/31/20 0509 11/04/20 0621 11/05/20 0514  NA 138 139 136   < > 132* 134* 132* 135  K 4.3 4.4 4.2   < > 4.1 4.3 4.0 4.0  CL 102 102 102   < > 99 99 98 98  CO2 $Re'27 29 24   'amZ$ < > $R'23 26 26 29  'fG$ GLUCOSE 104* 95 97   < > 110* 106* 91 84  BUN 22 28* 30*   < > 22 16 7* 12  CALCIUM 9.3 9.5 9.2   < > 8.9 9.0 9.1 9.1  CREATININE 1.23* 1.33* 1.32*   < > 1.34* 1.03* 0.99 1.04*  GFRNONAA 41* 37* 37*   < > 39* 54* 56* 53*  GFRAA 47* 43* 43*  --   --   --   --   --    < > =  values in this interval not displayed.    LIVER FUNCTION TESTS: Recent Labs    07/09/20 1050 10/21/20 1123 10/27/20 0151 10/27/20 0933 10/28/20 0118 10/29/20 0312 10/30/20 0303 10/31/20 0509  BILITOT 1.3* 1.5*  --  0.7  --   --   --  0.7  AST 20 20  --  23  --   --   --  40  ALT 12 13  --  16  --   --   --  44  ALKPHOS 46 36*  --  44  --   --   --  49  PROT 8.8* 8.1  --  6.2*  --   --   --  6.6  ALBUMIN 4.1 3.3*   < > 2.2* 2.2* 2.5* 2.7* 2.5*   < > = values in this interval not displayed.    TUMOR MARKERS: No results for input(s): AFPTM, CEA, CA199, CHROMGRNA in the last 8760 hours.  Assessment and Plan:  Anemia of chronic disease Pancytopenia-- neutropenia since 2017 Concerning for myelodysplastic syndrome Scheduled for Bone marrow bx 1/5 in IR Risks and benefits of Bone marrow biopsy was discussed with the patient and/or patient's family (grandson an MD in room) including, but not limited to  bleeding, infection, damage to adjacent structures or low yield requiring additional tests.  All of the questions were answered and there is agreement to proceed. Consent signed and in chart.   Thank you for this interesting consult.  I greatly enjoyed meeting Tameshia Bonneville and look forward to participating in their care.  A copy of this report was sent to the requesting provider on this date.  Electronically Signed: Lavonia Drafts, PA-C 11/05/2020, 1:04 PM   I spent a total of 20 Minutes    in face to face in clinical consultation, greater than 50% of which was counseling/coordinating care for Bone marrow bx

## 2020-11-06 ENCOUNTER — Inpatient Hospital Stay (HOSPITAL_COMMUNITY): Payer: Medicare Other | Admitting: Occupational Therapy

## 2020-11-06 ENCOUNTER — Inpatient Hospital Stay (HOSPITAL_COMMUNITY): Payer: Medicare Other

## 2020-11-06 LAB — CBC WITH DIFFERENTIAL/PLATELET
Abs Immature Granulocytes: 0.1 10*3/uL — ABNORMAL HIGH (ref 0.00–0.07)
Basophils Absolute: 0 10*3/uL (ref 0.0–0.1)
Basophils Relative: 0 %
Eosinophils Absolute: 0 10*3/uL (ref 0.0–0.5)
Eosinophils Relative: 0 %
HCT: 31.6 % — ABNORMAL LOW (ref 36.0–46.0)
Hemoglobin: 9 g/dL — ABNORMAL LOW (ref 12.0–15.0)
Immature Granulocytes: 4 %
Lymphocytes Relative: 42 %
Lymphs Abs: 1.2 10*3/uL (ref 0.7–4.0)
MCH: 29.1 pg (ref 26.0–34.0)
MCHC: 28.5 g/dL — ABNORMAL LOW (ref 30.0–36.0)
MCV: 102.3 fL — ABNORMAL HIGH (ref 80.0–100.0)
Monocytes Absolute: 1.3 10*3/uL — ABNORMAL HIGH (ref 0.1–1.0)
Monocytes Relative: 47 %
Neutro Abs: 0.2 10*3/uL — CL (ref 1.7–7.7)
Neutrophils Relative %: 7 %
Platelets: 85 10*3/uL — ABNORMAL LOW (ref 150–400)
RBC: 3.09 MIL/uL — ABNORMAL LOW (ref 3.87–5.11)
RDW: 19.5 % — ABNORMAL HIGH (ref 11.5–15.5)
WBC: 2.8 10*3/uL — ABNORMAL LOW (ref 4.0–10.5)
nRBC: 2.5 % — ABNORMAL HIGH (ref 0.0–0.2)

## 2020-11-06 LAB — BASIC METABOLIC PANEL
Anion gap: 9 (ref 5–15)
BUN: 10 mg/dL (ref 8–23)
CO2: 27 mmol/L (ref 22–32)
Calcium: 9 mg/dL (ref 8.9–10.3)
Chloride: 99 mmol/L (ref 98–111)
Creatinine, Ser: 0.98 mg/dL (ref 0.44–1.00)
GFR, Estimated: 57 mL/min — ABNORMAL LOW (ref >60)
Glucose, Bld: 95 mg/dL (ref 70–99)
Potassium: 4.3 mmol/L (ref 3.5–5.1)
Sodium: 135 mmol/L (ref 135–145)

## 2020-11-06 LAB — PROTIME-INR
INR: 1.3 — ABNORMAL HIGH (ref 0.8–1.2)
Prothrombin Time: 15.4 seconds — ABNORMAL HIGH (ref 11.4–15.2)

## 2020-11-06 LAB — PHOSPHORUS: Phosphorus: 3.7 mg/dL (ref 2.5–4.6)

## 2020-11-06 LAB — PATHOLOGIST SMEAR REVIEW

## 2020-11-06 MED ORDER — FENTANYL CITRATE (PF) 100 MCG/2ML IJ SOLN
INTRAMUSCULAR | Status: AC
  Filled 2020-11-06: qty 2

## 2020-11-06 MED ORDER — SODIUM CHLORIDE 0.9 % IV SOLN
INTRAVENOUS | Status: AC | PRN
  Administered 2020-11-06: 10 mL/h via INTRAVENOUS

## 2020-11-06 MED ORDER — MIDAZOLAM HCL 2 MG/2ML IJ SOLN
INTRAMUSCULAR | Status: AC
  Filled 2020-11-06: qty 2

## 2020-11-06 MED ORDER — FENTANYL CITRATE (PF) 100 MCG/2ML IJ SOLN
INTRAMUSCULAR | Status: AC | PRN
  Administered 2020-11-06 (×2): 25 ug via INTRAVENOUS

## 2020-11-06 NOTE — Progress Notes (Signed)
Occupational Therapy Session Note  Patient Details  Name: Lisa [name] MRN: [id number] Date of Birth: 26-Jan-1934  Today's Date: 11/06/2020 OT Individual Time: 1505-1600 OT Individual Time Calculation (min): 55 min    Short Term Goals: Week 1:  OT Short Term Goal 1 (Week 1): Pt will perform 3/3 toilet tasks with CGA OT Short Term Goal 2 (Week 1): Pt will consistently perform sit to stand at RW with CGA for LB ADL OT Short Term Goal 3 (Week 1): Pt will perform LB dress with compensatory techniques PRN with CGA OT Short Term Goal 4 (Week 1): pt will perform activity/ADL for 10+ mins without fatigue  Skilled Therapeutic Interventions/Progress Updates:    1:1. Pt received in recliner agreeable to OT. Pt completes functional mobility with RW throughout session with VC for relaxing shoulders away from ears and proximity to RW. Pt transfers to toilet as just stated with CGA overall and completes 3/3 components of toileting with CGA-S for seated hygeine. Pt completes standing hand hygeine at sink with S. Attempted to teach pt for cognition/novel task how to oporate Wii remote for bowling, however pt continues to require total cueing therefore terminated task. Pt completes standing BITS task for reaction time, dynamic standing balance, functional reach, memory and attention. With with initially mod VC for following direction of task fading to supervision. Exited session with pt seated in bed, exit alarm on and call light in reach   Therapy Documentation Precautions:  Precautions Precautions: Fall Precaution Comments: unsteady knees Restrictions Weight Bearing Restrictions: No General: General OT Amount of Missed Time: 40 Minutes Vital Signs: Therapy Vitals Temp: 98.2 F (36.8 C) Pulse Rate: (!) 120 Resp: 16 BP: 100/66 Patient Position (if appropriate): Sitting Oxygen Therapy SpO2: 94 % O2 Device: Room Air Pain: Pain Assessment Pain Scale: 0-10 Pain Score: 0-No  pain ADL: ADL Grooming: Setup Upper Body Bathing: Supervision/safety Where Assessed-Upper Body Bathing: Sitting at sink Lower Body Bathing: Minimal assistance Where Assessed-Lower Body Bathing: Sitting at sink,Standing at sink Upper Body Dressing: Setup Where Assessed-Upper Body Dressing: Sitting at sink Lower Body Dressing: Minimal assistance Where Assessed-Lower Body Dressing: Standing at sink,Sitting at sink Toileting: Moderate assistance Toilet Transfer: Moderate assistance Vision   Perception    Praxis   Exercises:   Other Treatments:     Therapy/Group: Individual Therapy  Tonny Branch 11/06/2020, 12:58 PM

## 2020-11-06 NOTE — Progress Notes (Signed)
[place] PHYSICAL MEDICINE & REHABILITATION PROGRESS NOTE   Subjective/Complaints: Pt reports doing well- just going to bone marrow biopsy right now.   Has no specific complaints.  ANC is still 0.2 and WBC slightly up to 2.8.  Plts down to 85k. From 92k.    ROS:   Pt denies SOB, abd pain, CP, N/V/C/D, and vision changes  Objective:   No results found. Recent Labs    11/05/20 0514 11/06/20 0555  WBC 2.6* 2.8*  HGB 8.9* 9.0*  HCT 30.6* 31.6*  PLT 92* 85*   Recent Labs    11/05/20 0514 11/06/20 0555  NA 135 135  K 4.0 4.3  CL 98 99  CO2 29 27  GLUCOSE 84 95  BUN 12 10  CREATININE 1.04* 0.98  CALCIUM 9.1 9.0    Intake/Output Summary (Last 24 hours) at 11/06/2020 0803 Last data filed at 11/05/2020 1837 Gross per 24 hour  Intake 480 ml  Output --  Net 480 ml        Physical Exam: Vital Signs Blood pressure 126/78, pulse 97, temperature 98.2 F (36.8 C), resp. rate 18, height 5' 7" (1.702 m), weight 93.2 kg, SpO2 100 %. Constitutional:on bed, heading to bone marrow biopsy- in hallway, NAD HENT: Normocephalic.  Atraumatic. Eyes: EOMI. No discharge. Cardiovascular: rate controlled-  Respiratory: CTA B/L- no W/R/R- good air movement GI: Soft, NT, ND, (+)BS  Skin: Warm and dry.  Intact. Psych: appropriate, anxious due to biopsy Musc: Mild lower extreme edema No tenderness in extremities. Neuro: Alert HOH Bilateral upper extremities: 5/5 proximal distal  Bilateral lower extremities: Hip flexion: 4-4+/5, distally 5/5, stable  Assessment/Plan: 1. Functional deficits which require 3+ hours per day of interdisciplinary therapy in a comprehensive inpatient rehab setting.  Physiatrist is providing close team supervision and 24 hour management of active medical problems listed below.  Physiatrist and rehab team continue to assess barriers to discharge/monitor patient progress toward functional and medical goals  Care Tool:  Bathing    Body parts bathed by  patient: Right arm,Left arm,Chest,Abdomen,Front perineal area,Right upper leg,Left upper leg,Face,Buttocks,Right lower leg,Left lower leg (declined washing feet today)   Body parts bathed by helper: Right lower leg,Left lower leg     Bathing assist Assist Level: Contact Guard/Touching assist     Upper Body Dressing/Undressing Upper body dressing   What is the patient wearing?: Pull over shirt    Upper body assist Assist Level: Supervision/Verbal cueing    Lower Body Dressing/Undressing Lower body dressing      What is the patient wearing?: Pants,Incontinence brief     Lower body assist Assist for lower body dressing: Contact Guard/Touching assist     Toileting Toileting    Toileting assist Assist for toileting: Contact Guard/Touching assist     Transfers Chair/bed transfer  Transfers assist     Chair/bed transfer assist level: Minimal Assistance - Patient > 75%     Locomotion Ambulation   Ambulation assist      Assist level: Contact Guard/Touching assist Assistive device: Walker-rolling Max distance: 156'   Walk 10 feet activity   Assist     Assist level: Contact Guard/Touching assist Assistive device: Walker-rolling   Walk 50 feet activity   Assist Walk 50 feet with 2 turns activity did not occur: Safety/medical concerns  Assist level: Contact Guard/Touching assist Assistive device: Walker-rolling    Walk 150 feet activity   Assist Walk 150 feet activity did not occur: Safety/medical concerns  Assist level: Contact Guard/Touching assist Assistive  device: Walker-rolling    Walk 10 feet on uneven surface  activity   Assist Walk 10 feet on uneven surfaces activity did not occur: Safety/medical concerns         Wheelchair     Assist Will patient use wheelchair at discharge?: No Type of Wheelchair: Manual    Wheelchair assist level: Supervision/Verbal cueing Max wheelchair distance: 130'    Wheelchair 50 feet with 2  turns activity    Assist        Assist Level: Supervision/Verbal cueing   Wheelchair 150 feet activity     Assist      Assist Level: Maximal Assistance - Patient 25 - 49% (pt able to propel 70 feet)   Blood pressure 126/78, pulse 97, temperature 98.2 F (36.8 C), resp. rate 18, height 5' 7" (1.702 m), weight 93.2 kg, SpO2 100 %.    Medical Problem List and Plan: 1. Debility secondary to AKI superimposed on CKD stage III/hyponatremia/gout/atrial fibrillation  Continue CIR  1/4- doing ADLs min A and walking 100+ ft with RW min A per therapy- will arrange for d/c date likely later this week. 1/5- set d/c date as 1/11- suggested to family to move up Heme/Onc appointment.   2. Antithrombotics: -DVT/anticoagulation: SCDs -antiplatelet therapy: N/A 3. Pain Management: Tylenol as needed            Controlled on 1/2 4. Mood: Provide emotional support -antipsychotic agents: N/A 5. Neuropsych: This patient is capable of making decisions on her own behalf. 6. Skin/Wound Care: Routine skin checks 7. Fluids/Electrolytes/Nutrition:  8. Acute new onset atrial fibrillation: Monitor with increased exertion.  1/5- rate controlled- con't meds 9. H. pylori positive. Completed triple regimen therapy and then maintain Protonix twice daily 10. Bilateral lower extremity knee pain. Arthrocentesis of both knees. Fluid analysis no evidence of infection but urate crystals diagnostic gouty arthritis. Continue allopurinol. No colchicine due to AKI            1/5- d/c allopurinol since can lower ANC? Tylenol helping pain.  Monitor with increased activity 11. Hypothyroidism: Synthroid 12. Decreased nutritional storage. Dietary follow-up 13. AKI superimposed on CKD stage IIIa.  Creatinine 1.03 on 12/30, labs ordered for tomorrow  1/5- Cr 0.98- con't regimen 14. History of breast cancer in 1999 treated with lumpectomy radiation as well  as tamoxifen. Follow-up outpatient 15. History of lung cancer. Status post partial upper lobe lobectomy 2006. Follow-up outpatient 16. Anemia of chronic disease/pancytopenia. Patient has been seen by hematology services outpatient. Hemoglobin 8.0 on 12/30, labs ordered for tomorrow 17. Low albumin: encourage intake of high protein foods.  18.  Labile blood pressure  Relatively controlled on 1/2 19.  Hyponatremia  Sodium 134 on 12/30, labs ordered for tomorrow  1/4- Na up to 135- from 122 at admission- con't regimen 20. Hypomagnesemia  12/3- will replete Mg 800 mg BID for Mg of 1.3- K+ is OK- and recheck labs in AM  1/5- will recheck Mg level- was 1.4 yesterday.  21. ANC critically low/pancytopenia.   1/3- will Consult HemeOnc- esp with her hx of Cancer x2- will check Iron, TIBC, ferritin, folate and Vit B12- and daily CBC with diff per Oncology- Dr Lorenso Courier and then if they are normal, migh tneed bone marrow biopsy.   1/4- spoke with grandson- they were planning to do bone marrow biopsy before admission "in the next few weeks"- labs checking iron, etc were OK- will likely need Bone marrow biopsy- but pt would rather do outpt- she wants  to go home. D/w grandson, Rosalita Chessman- they've been thinking MDS for 2+ years, but hadn't done bone marrow biopsy yet.   1/5- arranged for bone marrow biopsy today- going now.  22. Insomnia  1/3- write for trazodone for sleep.  1/4- slept MUCH better  23. Thrombocytopenia  1/5- down to 85k- off blood thinners.     LOS: 7 days A FACE TO FACE EVALUATION WAS PERFORMED    11/06/2020, 8:03 AM

## 2020-11-06 NOTE — Progress Notes (Signed)
Occupational Therapy Session Note  Patient Details  Name: Lisa [name] MRN: [id number] Date of Birth: Apr 01, 1934  Today's Date: 11/06/2020 OT Individual Time: 6195-0932 OT Individual Time Calculation (min): 25 min    Short Term Goals: Week 1:  OT Short Term Goal 1 (Week 1): Pt will perform 3/3 toilet tasks with CGA OT Short Term Goal 2 (Week 1): Pt will consistently perform sit to stand at RW with CGA for LB ADL OT Short Term Goal 3 (Week 1): Pt will perform LB dress with compensatory techniques PRN with CGA OT Short Term Goal 4 (Week 1): pt will perform activity/ADL for 10+ mins without fatigue   Skilled Therapeutic Interventions/Progress Updates:    Pt greeted at time of session on commode with NT, OT to resume care. Pt sit to stand from commode with BSC over top with CGA and performed clothing management in same manner before walking back to recliner with RW, cues for keeping RW close and for hand placement. Once up in recliner, RN checking vitals. Pt ed/discussion regarding DC planning for home with daughter, recommendations for Supervision, and plans for family ed tomorrow in am after daughter gets off work. Also briefly performed AROM for BUEs for chest openers and overhead stretches d/t back tightness/stiffness after procedure. Alarm on, call bell in reach.   Therapy Documentation Precautions:  Precautions Precautions: Fall Precaution Comments: unsteady knees Restrictions Weight Bearing Restrictions: No     Therapy/Group: Individual Therapy  Viona Gilmore 11/06/2020, 12:14 PM

## 2020-11-06 NOTE — Progress Notes (Signed)
Occupational Therapy Session Note  Patient Details  Name: Lisa [name] MRN: [id number] Date of Birth: Aug 16, 1934  Today's Date: 11/06/2020 OT Individual Time: 0730-0750 OT Individual Time Calculation (min): 20 min  and Today's Date: 11/06/2020 OT Missed Time: 40 Minutes Missed Time Reason: X-Ray (IR for biopsy)   Short Term Goals: Week 1:  OT Short Term Goal 1 (Week 1): Pt will perform 3/3 toilet tasks with CGA OT Short Term Goal 2 (Week 1): Pt will consistently perform sit to stand at RW with CGA for LB ADL OT Short Term Goal 3 (Week 1): Pt will perform LB dress with compensatory techniques PRN with CGA OT Short Term Goal 4 (Week 1): pt will perform activity/ADL for 10+ mins without fatigue  Skilled Therapeutic Interventions/Progress Updates:    Pt greeted at time of session supine in bed resting, agreeable to OT session, has been NPO after midnight for procedure this am. Extensive conversation with daughter [name] regarding pt's CLOF and goals for supervision, problem solving through this at home as she works nights now but will maybe be transitioning to 1st shift. Looking into an in home aide/PCA for time that daughter is at work. Daughter is aware of current need of assist at [facility] to occasional Min level. Tech entered at this time and tp taken down to IR for biopsy and missed 40 of OT.   Therapy Documentation Precautions:  Precautions Precautions: Fall Precaution Comments: unsteady knees Restrictions Weight Bearing Restrictions: No     Therapy/Group: Individual Therapy  Viona Gilmore 11/06/2020, 7:57 AM

## 2020-11-06 NOTE — Procedures (Signed)
Interventional Radiology Procedure Note  Procedure: CT guided aspirate and core biopsy of posterior right iliac bone Complications: None Recommendations: - Bedrest supine x 1 hrs, ambulate per primary - OTC's PRN  Pain - Follow biopsy results  Signed,  Dulcy Fanny. Earleen Newport, DO

## 2020-11-06 NOTE — Progress Notes (Signed)
   11/06/20 0931  Assess: if the MEWS score is Yellow or Red  Were vital signs taken at a resting state? Yes  Focused Assessment No change from prior assessment  Early Detection of Sepsis Score *See Row Information* Low  MEWS guidelines implemented *See Row Information* No, vital signs rechecked  Treat  Pain Scale 0-10  Pain Score 0  Notify: Charge Nurse/RN  Name of Charge Nurse/RN Notified Santiago Glad  Date Charge Nurse/RN Notified 11/06/20  Time Charge Nurse/RN Notified 0930  Notify: Provider  Provider Name/Title Marlowe Shores  Date Provider Notified 11/06/20  Time Provider Notified 0930  Notification Type Call  Response No new orders  Date of Provider Response 11/06/20  Time of Provider Response 0935  Document  Patient Outcome Other (Comment) (q4 vitals)  Progress note created (see row info) Yes    Pt is stable in bed, AXO4, pain is 0 out of 10. No new orders.   Dayna Ramus

## 2020-11-06 NOTE — Progress Notes (Signed)
Physical Therapy Session Note  Patient Details  Name: Lisa [name] MRN: [id number] Date of Birth: 07-15-1934  Today's Date: 11/06/2020 PT Individual Time: 1300-1330 PT Individual Time Calculation (min): 30 min   Short Term Goals: Week 1:  PT Short Term Goal 1 (Week 1): Patient to perform transfers with min A. PT Short Term Goal 2 (Week 1): Patient to ambulate 98' with RW and min A. PT Short Term Goal 3 (Week 1): Patient to negotiate 8 short steps with rails and min A  Skilled Therapeutic Interventions/Progress Updates:    Patient in recliner and reports mild back soreness from procedure this morning.  Performed sit to stand with min/mod A increased time reporting knee pain.  Ambulated 157' with RW and CGA.  Patient performed standing balance bouncing ball at rebounder and catching 2 x 15 reps.  Performed seated therex including rows with orange t-band 2 x 10, seated hip abduction and flexion with orange t-band around knees 2 x 10.  Stand pivot back to w/c with min A.  Pushed in w/c to room and stand step to recliner with min a using RW.  Left in recliner with NT in the room.   Therapy Documentation Precautions:  Precautions Precautions: Fall Precaution Comments: unsteady knees Restrictions Weight Bearing Restrictions: No General: PT Amount of Missed Time (min): 15 Minutes PT Missed Treatment Reason: Unavailable (Comment) (had procedure during scheduled session, made up 30 min) Pain: Pain Assessment Pain Scale: 0-10 Pain Score: 0-No pain Faces Pain Scale: Hurts little more Pain Type: Chronic pain Pain Location: Knee Pain Orientation: Left;Right Pain Intervention(s): Rest    Therapy/Group: Individual Therapy  [name]  [place], PT 11/06/2020, 1:22 PM

## 2020-11-07 ENCOUNTER — Inpatient Hospital Stay (HOSPITAL_COMMUNITY): Payer: Medicare Other | Admitting: Occupational Therapy

## 2020-11-07 ENCOUNTER — Encounter (HOSPITAL_COMMUNITY): Payer: Self-pay | Admitting: Physical Medicine and Rehabilitation

## 2020-11-07 ENCOUNTER — Inpatient Hospital Stay (HOSPITAL_COMMUNITY): Payer: Medicare Other

## 2020-11-07 LAB — CBC WITH DIFFERENTIAL/PLATELET
Abs Immature Granulocytes: 0.07 10*3/uL (ref 0.00–0.07)
Basophils Absolute: 0 10*3/uL (ref 0.0–0.1)
Basophils Relative: 0 %
Eosinophils Absolute: 0 10*3/uL (ref 0.0–0.5)
Eosinophils Relative: 0 %
HCT: 27.9 % — ABNORMAL LOW (ref 36.0–46.0)
Hemoglobin: 8.2 g/dL — ABNORMAL LOW (ref 12.0–15.0)
Immature Granulocytes: 3 %
Lymphocytes Relative: 43 %
Lymphs Abs: 1.1 10*3/uL (ref 0.7–4.0)
MCH: 29.8 pg (ref 26.0–34.0)
MCHC: 29.4 g/dL — ABNORMAL LOW (ref 30.0–36.0)
MCV: 101.5 fL — ABNORMAL HIGH (ref 80.0–100.0)
Monocytes Absolute: 1.2 10*3/uL — ABNORMAL HIGH (ref 0.1–1.0)
Monocytes Relative: 46 %
Neutro Abs: 0.2 10*3/uL — CL (ref 1.7–7.7)
Neutrophils Relative %: 8 %
Platelets: 73 10*3/uL — ABNORMAL LOW (ref 150–400)
RBC: 2.75 MIL/uL — ABNORMAL LOW (ref 3.87–5.11)
RDW: 19.9 % — ABNORMAL HIGH (ref 11.5–15.5)
WBC: 2.7 10*3/uL — ABNORMAL LOW (ref 4.0–10.5)
nRBC: 3 % — ABNORMAL HIGH (ref 0.0–0.2)

## 2020-11-07 LAB — BASIC METABOLIC PANEL
Anion gap: 9 (ref 5–15)
BUN: 9 mg/dL (ref 8–23)
CO2: 27 mmol/L (ref 22–32)
Calcium: 8.6 mg/dL — ABNORMAL LOW (ref 8.9–10.3)
Chloride: 99 mmol/L (ref 98–111)
Creatinine, Ser: 1.05 mg/dL — ABNORMAL HIGH (ref 0.44–1.00)
GFR, Estimated: 52 mL/min — ABNORMAL LOW (ref >60)
Glucose, Bld: 97 mg/dL (ref 70–99)
Potassium: 4.6 mmol/L (ref 3.5–5.1)
Sodium: 135 mmol/L (ref 135–145)

## 2020-11-07 LAB — MAGNESIUM: Magnesium: 1.6 mg/dL — ABNORMAL LOW (ref 1.7–2.4)

## 2020-11-07 LAB — PATHOLOGIST SMEAR REVIEW

## 2020-11-07 NOTE — Progress Notes (Signed)
Patient ID: Lisa [name], female   DOB: October 19, 1934, 87 y.o.   MRN: [id number] Therapy team asking to set up family education with daughter whom pt lives with. Contacted brenda-daughter who can be here tomorrow at 8:00 to go through therapies. Have let team now see in am tomorrow.

## 2020-11-07 NOTE — Progress Notes (Signed)
Physical Therapy Weekly Progress Note  Patient Details  Name: Lisa [name] MRN: [id number] Date of Birth: [birth year]  Beginning of progress report period: October 31, 2020 End of progress report period: November 07, 2020  Today's Date: 11/07/2020 PT Individual Time: 9326-7124 PT Individual Time Calculation (min): 55 min   Patient has met 3 of 3 short term goals.  Patient able to achieve STG's with transfers at min A (though at times needing increased time to rise due to knee pain), ambulating up to 156' with min A and negotiating steps with rails and CGA.  Still somewhat limited due to knee pain and needing rest breaks.  Also needs cues for walker safety and improving with hand placement for safety with transfers.  Patient continues to demonstrate the following deficits muscle weakness, muscle joint tightness and pain and therefore will continue to benefit from skilled PT intervention to increase functional independence with mobility.  Patient progressing toward long term goals..  Continue plan of care.  PT Short Term Goals Week 1:  PT Short Term Goal 1 (Week 1): Patient to perform transfers with min A. PT Short Term Goal 1 - Progress (Week 1): Met PT Short Term Goal 2 (Week 1): Patient to ambulate 60' with RW and min A. PT Short Term Goal 2 - Progress (Week 1): Met PT Short Term Goal 3 (Week 1): Patient to negotiate 8 short steps with rails and min A PT Short Term Goal 3 - Progress (Week 1): Met Week 2:  PT Short Term Goal 1 (Week 2): STG=LTG due to ELOS  Skilled Therapeutic Interventions/Progress Updates:  Ambulation/gait training;DME/adaptive equipment instruction;Functional mobility training;Therapeutic Activities;UE/LE Strength taining/ROM;UE/LE Coordination activities;Therapeutic Exercise;Stair training;Patient/family education;Balance/vestibular training;Wheelchair propulsion/positioning   Patient in recliner in room.  Reports mild back soreness at [name] biopsy site.  Still with  soreness in knees, daughter in the room initially fixing pt's hair and reports son checking into [place] benefits so pt will have some supervision at home since the grandson in [place] had Covid.  Patient sit to stand with min A from recliner mod cues and increased time with c/o pain.  Ambulated 157' with RW and CGA.  Seated for rest, then negotiated 8 (3") steps with rails and min A to CGA.  Patient performed seated therex on mat as noted below, then sit to supine on wedge with S for supine exercises noted below.  Returned to sitting with S.  Transferred to w/c with RW and CGA.  Patient assisted to room in w/c and stand step to recliner CGA/ min A with RW.  Left seated in recliner with call bell/needs in reach and chair alarm active.    Therapy Documentation Precautions:  Precautions Precautions: Fall Precaution Comments: unsteady knees Restrictions Weight Bearing Restrictions: No Pain: Pain Assessment Pain Scale: 0-10 Pain Score: 5  Pain Type: Chronic pain Pain Location: Knee Pain Orientation: Right;Left Pain Descriptors / Indicators: Aching;Tightness Pain Onset: On-going Pain Intervention(s): Ambulation/increased activity;Rest   Exercises: General Exercises - Upper Extremity Shoulder Horizontal ABduction: Strengthening;Both;10 reps;Theraband General Exercises - Lower Extremity Hip ABduction/ADduction: Strengthening;10 reps;Supine (orange t-band hooklying 2 sets) Total Joint Exercises Bridges: Strengthening;Both;5 reps;Supine (2 sets) Other Exercises Other Exercises: seated rows with orange t-band x 10 Other Exercises: 2 kg ball hip to knee x 10 each way Other Exercises: 1 kg ball in and out at chest x 10   Therapy/Group: Individual Therapy  [name]  Magda Kiel, PT 11/07/2020, 10:26 AM

## 2020-11-07 NOTE — Consult Note (Signed)
[place]  Telephone:([phone]) [phone] [phone]    [place]  Referring MD:  Dr. Courtney Heys  Reason for Referral: Pancytopenia  HPI: [name] is an 87 year old female with a past medical history significant for breast cancer, lung cancer, hypothyroidism, and gout.  She had a recent hospitalization from 10/21/2020 through 10/30/2020 for acute on chronic renal failure, acute hyponatremia, H. pylori positive, anemia of chronic disease.  She was discharged to inpatient rehabilitation.  The patient has been seen at her [facility] for history of breast cancer as well as a history of lung cancer.  Her last visit was on 10/17/2020 for pancytopenia.  She was also having symptoms including fatigue, weight loss, proximal muscle weakness, nausea, vomiting, diarrhea.  Although, she felt better after PCP started her on prednisone for PMR.  It was recommended for the patient to undergo a bone marrow biopsy.  A bone marrow biopsy was performed on 11/06/2020.  Results are currently pending.  Pathologist review of peripheral blood smear was performed earlier today which showed a leukopenia with neutropenia, macrocytic anemia, thrombocytopenia, and circulating NRBCs.  CBC from today has been reviewed and WBC is 2.7, hemoglobin 8.2, MCV 101.5, platelets 73,000, ANC 0.2.  Results are overall consistent with previously reported values. Folate and vitamin B12 level were normal.  Ferritin was elevated at 750, normal iron and percent saturation levels, TIBC elevated at 235.  The patient reports that she is feeling well.  She is working with therapy.  States that her appetite has improved.  She reports some soreness at the bone marrow biopsy site.  She is not having any fevers or chills.  Otherwise she offers no complaints such as headaches, dizziness, chest pain, shortness of breath, abdominal pain, nausea, vomiting.  No bleeding reported.  Hematology was asked  to see the patient make recommendations regarding her pancytopenia.  Past Medical History:  Diagnosis Date  . Adenocarcinoma of left lung ([place]) 2006  . Arthritis   . Asthma   . Cancer of breast, female ([place])   . Cancer of lung ([place])   . Hypertension   . Hypothyroidism   . Invasive ductal carcinoma of left breast ([place]) 1999  . Neutropenia ([place]) 06/09/2016  . Personal history of radiation therapy   :    Past Surgical History:  Procedure Laterality Date  . BIOPSY  10/23/2020   Procedure: BIOPSY;  Surgeon: Yetta Flock, MD;  Location: [facility] ENDOSCOPY;  Service: Gastroenterology;;  . BREAST BIOPSY     left axillary node dissection  . BREAST LUMPECTOMY Left   . COLONOSCOPY  03/08/2003   HBZ:[id number] rectal polyps destroyed with the tip of the snare/Polyps at hepatic flexure, splenic flexure at 35 cm/Left-sided diverticula: unable to retrieve path  . COLONOSCOPY  09/12/2008   OFB:PZWCHE rectum and distal sigmoid diminutive polyps/scattered left sided diverticulum. hyperplastic  . COLONOSCOPY N/A 03/06/2013   NID:[id number] polyp-removed as described above; colonic diverticulosis. hyperplastic polyps. next TCS 03/2018  . ESOPHAGOGASTRODUODENOSCOPY (EGD) WITH PROPOFOL N/A 10/23/2020   Procedure: ESOPHAGOGASTRODUODENOSCOPY (EGD) WITH PROPOFOL;  Surgeon: Yetta Flock, MD;  Location: [facility];  Service: Gastroenterology;  Laterality: N/A;  . FOOT SURGERY    . LUNG REMOVAL, PARTIAL     upper lobe  :   CURRENT MEDS: Current Facility-Administered Medications  Medication Dose Route Frequency Provider Last Rate Last Admin  . acetaminophen (TYLENOL) tablet 650 mg  650 mg Oral Q6H Angiulli, Daniel Lenna Sciara, PA-C   650 mg  at 11/07/20 1152  . albuterol (VENTOLIN HFA) 108 (90 Base) MCG/ACT inhaler 2 puff  2 puff Inhalation Q6H PRN Angiulli, Lavon Paganini, PA-C      . diclofenac Sodium (VOLTAREN) 1 % topical gel 4 g  4 g Topical QID Lovorn, Megan, MD   4 g at 11/07/20 1152  . feeding supplement  (ENSURE ENLIVE / ENSURE PLUS) liquid 237 mL  237 mL Oral BID BM AngiulliLavon Paganini, PA-C   237 mL at 11/07/20 1025  . fluticasone furoate-vilanterol (BREO ELLIPTA) 100-25 MCG/INH 1 puff  1 puff Inhalation Daily Angiulli, Lavon Paganini, PA-C   1 puff at 11/07/20 0740   And  . umeclidinium bromide (INCRUSE ELLIPTA) 62.5 MCG/INH 1 puff  1 puff Inhalation Daily AngiulliLavon Paganini, PA-C   1 puff at 11/07/20 0740  . furosemide (LASIX) tablet 10 mg  10 mg Oral Daily Cathlyn Parsons, PA-C   10 mg at 11/07/20 0846  . levothyroxine (SYNTHROID) tablet 100 mcg  100 mcg Oral Q0600 Cathlyn Parsons, PA-C   100 mcg at 11/07/20 6195  . magnesium oxide (MAG-OX) tablet 800 mg  800 mg Oral BID Lovorn, Megan, MD   800 mg at 11/07/20 0846  . metoprolol tartrate (LOPRESSOR) tablet 12.5 mg  12.5 mg Oral BID Cathlyn Parsons, PA-C   12.5 mg at 11/06/20 2003  . multivitamin with minerals tablet 1 tablet  1 tablet Oral Daily Cathlyn Parsons, PA-C   1 tablet at 11/07/20 0846  . nystatin (MYCOSTATIN) 100000 UNIT/ML suspension 500,000 Units  5 mL Oral TID AC & HS Cathlyn Parsons, PA-C   500,000 Units at 11/07/20 1152  . ondansetron (ZOFRAN) tablet 4 mg  4 mg Oral Q6H PRN Angiulli, Lavon Paganini, PA-C       Or  . ondansetron (ZOFRAN) injection 4 mg  4 mg Intravenous Q6H PRN Angiulli, Lavon Paganini, PA-C      . pantoprazole (PROTONIX) EC tablet 40 mg  40 mg Oral BID Lovorn, Jinny Blossom, MD   40 mg at 11/07/20 0846  . [START ON 11/08/2020] pantoprazole (PROTONIX) EC tablet 40 mg  40 mg Oral Daily Lovorn, Megan, MD      . polyethylene glycol (MIRALAX / GLYCOLAX) packet 17 g  17 g Oral Daily PRN Angiulli, Lavon Paganini, PA-C      . senna-docusate (Senokot-S) tablet 1 tablet  1 tablet Oral QHS Cathlyn Parsons, PA-C   1 tablet at 11/06/20 2253  . traMADol (ULTRAM) tablet 50 mg  50 mg Oral Q6H PRN Cathlyn Parsons, PA-C   50 mg at 11/02/20 2032  . traZODone (DESYREL) tablet 50 mg  50 mg Oral QHS Lovorn, Megan, MD   50 mg at 11/06/20 2253       Allergies  Allergen Reactions  . Meloxicam Other (See Comments)    Caused an injury to the kidneys, per nephrologist  . Micardis Hct [Telmisartan-Hctz] Other (See Comments)    Caused an injury to the kidneys, per nephrologist  :  Family History  Problem Relation Age of Onset  . Cancer Mother   . Cancer Sister   . Colon cancer Neg Hx   :  Social History   Socioeconomic History  . Marital status: Widowed    Spouse name: Not on file  . Number of children: Not on file  . Years of education: Not on file  . Highest education level: Not on file  Occupational History  . Not on file  Tobacco Use  .  Smoking status: Former Smoker    Packs/day: 0.75    Years: 35.00    Pack years: 26.25    Quit date: 1993    Years since quitting: 29.0  . Smokeless tobacco: Never Used  . Tobacco comment: smoke-free X 30 yeras  Vaping Use  . Vaping Use: Never used  Substance and Sexual Activity  . Alcohol use: No  . Drug use: No  . Sexual activity: Not Currently  Other Topics Concern  . Not on file  Social History Narrative  . Not on file   Social Determinants of Health   Financial Resource Strain: Not on file  Food Insecurity: Not on file  Transportation Needs: Not on file  Physical Activity: Not on file  Stress: Not on file  Social Connections: Not on file  Intimate Partner Violence: Not on file  :  REVIEW OF SYSTEMS:  A comprehensive 14 point review of systems was negative except as noted in the HPI.    Exam: Patient Vitals for the past 24 hrs:  BP Temp Temp src Pulse Resp SpO2  11/07/20 1144 (!) 122/56 98.4 F (36.9 C) Oral (!) 122 16 91 %  11/07/20 0852 (!) 99/52 98.3 F (36.8 C) Oral (!) 117 16 97 %  11/07/20 0400 (!) 94/58 98.8 F (37.1 C) Oral (!) 105 17 --  11/07/20 0015 137/66 98 F (36.7 C) Oral (!) 108 18 94 %  11/06/20 1959 (!) 119/51 99.2 F (37.3 C) Oral (!) 125 18 96 %  11/06/20 1616 (!) 97/50 98.6 F (37 C) -- 73 20 93 %  11/06/20 1350 (!) 122/110  98 F (36.7 C) Oral (!) 122 19 96 %    General: Awake and alert, sitting up in the recliner at time of visit Eyes:  no scleral icterus.   ENT:  There were no oropharyngeal lesions.   Neck was without thyromegaly.   Lymphatics:  Negative cervical, supraclavicular or axillary adenopathy.   Respiratory: lungs were clear bilaterally without wheezing or crackles.   Cardiovascular:  Regular rate and rhythm, S1/S2, without murmur, rub or gallop.  Trace pedal edema bilaterally GI:  abdomen was soft, flat, nontender, nondistended, without organomegaly.   Skin exam was without ecchymosis, petechiae.   Neuro exam was nonfocal.  Patient was alert and oriented.  Attention was good.   Language was appropriate.  Mood was normal without depression.  Speech was not pressured.  Thought content was not tangential.    LABS:  Lab Results  Component Value Date   WBC 2.7 (L) 11/07/2020   HGB 8.2 (L) 11/07/2020   HCT 27.9 (L) 11/07/2020   PLT 73 (L) 11/07/2020   GLUCOSE 97 11/07/2020   ALT 44 10/31/2020   AST 40 10/31/2020   NA 135 11/07/2020   K 4.6 11/07/2020   CL 99 11/07/2020   CREATININE 1.05 (H) 11/07/2020   BUN 9 11/07/2020   CO2 27 11/07/2020   INR 1.3 (H) 11/06/2020    DG Chest 2 View  Result Date: 10/26/2020 CLINICAL DATA:  Shortness of breath EXAM: CHEST - 2 VIEW COMPARISON:  10/26/2020, 10:59 a.m. FINDINGS: Mild cardiomegaly. Unchanged small bilateral pleural effusions. The visualized skeletal structures are unremarkable. IMPRESSION: Mild cardiomegaly with unchanged small bilateral pleural effusions. No new airspace opacity. Electronically Signed   By: Eddie Candle M.D.   On: 10/26/2020 12:23   DG Chest 2 View  Result Date: 10/21/2020 CLINICAL DATA:  Weakness EXAM: CHEST - 2 VIEW COMPARISON:  05/31/2017 FINDINGS: Cardiac  shadow is stable. Aortic calcifications are noted. The lungs are well aerated bilaterally. No focal infiltrate or sizable effusion is seen. No bony abnormality is  noted. IMPRESSION: No acute abnormality noted. Electronically Signed   By: Inez Catalina M.D.   On: 10/21/2020 13:59   DG Knee 1-2 Views Left  Result Date: 10/25/2020 CLINICAL DATA:  Knee pain, no known injury, initial encounter EXAM: LEFT KNEE - 2 VIEW COMPARISON:  None. FINDINGS: Tricompartmental degenerative changes are noted. Moderate joint effusion is noted similar to that seen on the right. No acute fracture or dislocation is noted. IMPRESSION: Degenerative change with joint effusion similar to that seen on the right side. Electronically Signed   By: Inez Catalina M.D.   On: 10/25/2020 13:01   DG Knee 1-2 Views Right  Result Date: 10/25/2020 CLINICAL DATA:  Bilateral knee pain EXAM: RIGHT KNEE - 2 VIEW COMPARISON:  None. FINDINGS: Tricompartmental degenerative changes are noted with lateral joint space narrowing. Moderate joint effusion is seen. No other soft tissue abnormality is seen. IMPRESSION: Degenerative change with joint effusion. Electronically Signed   By: Inez Catalina M.D.   On: 10/25/2020 13:00   DG Abd 1 View  Result Date: 10/26/2020 CLINICAL DATA:  Acute renal failure, abdominal distension EXAM: ABDOMEN - 1 VIEW COMPARISON:  CT abdomen and pelvis 05/31/2017 FINDINGS: Stool throughout colon, question containing retained contrast. Nonobstructive bowel gas pattern. No bowel dilatation or bowel wall thickening. Bones demineralized. Aneurysmal dilatation of the abdominal aorta, though unable to accurately assess size radiographically; patient has known abdominal aortic aneurysm and aortic tortuosity by prior CT. No definite urinary tract calcification. IMPRESSION: Nonobstructive bowel gas pattern. Aneurysmal dilatation of distal abdominal aorta though unable to at accurately assess size radiographically; patient has known abdominal aortic aneurysm by prior CT exam. Recommend ultrasound or CT assessment to assess interval change. Findings called to Dr. Clearence Ped on 10/26/2020 at 1905  hrs. Electronically Signed   By: Lavonia Dana M.D.   On: 10/26/2020 19:05   CT Head Wo Contrast  Result Date: 10/21/2020 CLINICAL DATA:  Altered level of consciousness EXAM: CT HEAD WITHOUT CONTRAST TECHNIQUE: Contiguous axial images were obtained from the base of the skull through the vertex without intravenous contrast. COMPARISON:  06/04/2005 FINDINGS: Brain: There are confluent hypodensities throughout the periventricular white matter, more pronounced since prior study, consistent with age-indeterminate small vessel ischemic changes. These are likely chronic. No other signs of acute infarct or hemorrhage. Lateral ventricles and remaining midline structures are unremarkable. No acute extra-axial fluid collections. No mass effect. Vascular: No hyperdense vessel or unexpected calcification. Skull: Normal. Negative for fracture or focal lesion. Sinuses/Orbits: Minimal mucosal thickening within the sphenoid sinus. Remaining sinuses are clear. Other: None. IMPRESSION: 1. Diffuse hypodensities throughout the periventricular white matter, likely representing chronic small vessel ischemic changes. 2. Otherwise no acute intracranial process. Electronically Signed   By: Randa Ngo M.D.   On: 10/21/2020 15:01   CT CHEST ABDOMEN PELVIS W CONTRAST  Result Date: 10/24/2020 CLINICAL DATA:  Poor appetite, personal history of breast cancer and lung cancer EXAM: CT CHEST, ABDOMEN, AND PELVIS WITH CONTRAST TECHNIQUE: Multidetector CT imaging of the chest, abdomen and pelvis was performed following the standard protocol during bolus administration of intravenous contrast. CONTRAST:  149m OMNIPAQUE IOHEXOL 300 MG/ML  SOLN COMPARISON:  06/13/2019 FINDINGS: CT CHEST FINDINGS Cardiovascular: The heart is mildly enlarged without pericardial effusion. Extensive atherosclerosis throughout the coronary vasculature. The aorta is normal in caliber. There is extensive atherosclerosis of the aortic  arch, with moderate stenosis of  the origin of the left subclavian artery. Mediastinum/Nodes: Stable subcentimeter left hilar lymph nodes. No pathologic adenopathy. Thyroid, trachea, and esophagus are unremarkable. Lungs/Pleura: Stable postsurgical changes from partial left upper lobe resection. Upper lobe predominant emphysema. Bibasilar scarring. No airspace disease, effusion, or pneumothorax. Musculoskeletal: No acute or destructive bony lesions. Reconstructed images demonstrate no additional findings. CT ABDOMEN PELVIS FINDINGS Hepatobiliary: No focal liver abnormality is seen. No gallstones, gallbladder wall thickening, or biliary dilatation. Pancreas: Unremarkable. No pancreatic ductal dilatation or surrounding inflammatory changes. Spleen: Normal in size without focal abnormality. Adrenals/Urinary Tract: Adrenal glands are unremarkable. Kidneys are normal, without renal calculi, focal lesion, or hydronephrosis. Bladder is unremarkable. Stomach/Bowel: No bowel obstruction or ileus. Diverticulosis of the sigmoid colon without diverticulitis. Normal appendix right lower quadrant. No bowel wall thickening or inflammatory change. Vascular/Lymphatic: Infrarenal abdominal aortic aneurysm measures up to 4.8 cm in diameter, previously having measured 4.1 cm on 05/31/2017 exam. Significant atherosclerosis and mural thrombus throughout the aorta. No pathologic adenopathy. Reproductive: Uterus and bilateral adnexa are unremarkable. Other: No free fluid or free gas. No abdominal wall hernia. Musculoskeletal: No acute or destructive bony lesions. Reconstructed images demonstrate no additional findings. IMPRESSION: 1. Stable postsurgical changes from partial left upper lobe resection. 2. Infrarenal abdominal aortic aneurysm measures up to 4.8 cm in diameter, previously having measured 4.1 cm on 05/31/2017 exam. Recommend follow-up CT/MR every 6 months and vascular consultation. This recommendation follows ACR consensus guidelines: White Paper of the ACR  Incidental Findings Committee II on Vascular Findings. J Am Coll Radiol 2013; 10:789-794. 3. Diverticulosis without diverticulitis. 4. Aortic Atherosclerosis (ICD10-I70.0) and Emphysema (ICD10-J43.9). Electronically Signed   By: Randa Ngo M.D.   On: 10/24/2020 18:00   DG CHEST PORT 1 VIEW  Result Date: 10/26/2020 CLINICAL DATA:  Shortness of breath. EXAM: PORTABLE CHEST 1 VIEW COMPARISON:  10/21/2020 FINDINGS: Lordotic technique is demonstrated. Lungs are adequately inflated with hazy density over the left base/retrocardiac region which may be due to atelectasis/effusion versus prominent overlying soft tissues. Borderline stable cardiomegaly. Remainder of the exam is unchanged. IMPRESSION: Hazy density over the left base/retrocardiac region which may be due to atelectasis/effusion versus prominent overlying soft tissues. Consider PA and lateral chest x-ray for better evaluation of the left base. Electronically Signed   By: Marin Olp M.D.   On: 10/26/2020 11:05   CT BONE MARROW BIOPSY  Result Date: 11/06/2020 INDICATION: 87 year old female with a history of pancytopenia EXAM: CT BIOPSY BONE MARROW MEDICATIONS: None. ANESTHESIA/SEDATION: No moderate sedation was employed during this procedure. A total of Versed 0 mg and Fentanyl 25 mcg was administered intravenously. No moderate sedation. The patient's level of consciousness and vital signs were monitored continuously by radiology nursing throughout the procedure under my direct supervision. FLUOROSCOPY TIME:  CT COMPLICATIONS: None PROCEDURE: The procedure risks, benefits, and alternatives were explained to the patient. Questions regarding the procedure were encouraged and answered. The patient understands and consents to the procedure. Scout CT of the pelvis was performed for surgical planning purposes. The posterior pelvis was prepped with Chlorhexidine in a sterile fashion, and a sterile drape was applied covering the operative field. A sterile  gown and sterile gloves were used for the procedure. Local anesthesia was provided with 1% Lidocaine. Posterior right iliac bone was targeted for biopsy. The skin and subcutaneous tissues were infiltrated with 1% lidocaine without epinephrine. A small stab incision was made with an 11 blade scalpel, and an 11 gauge Murphy needle was advanced with  CT guidance to the posterior cortex. Manual forced was used to advance the needle through the posterior cortex and the stylet was removed. A bone marrow aspirate was retrieved and passed to a cytotechnologist in the room. The Murphy needle was then advanced without the stylet for a core biopsy. The core biopsy was retrieved and also passed to a cytotechnologist. Manual pressure was used for hemostasis and a sterile dressing was placed. No complications were encountered no significant blood loss was encountered. Patient tolerated the procedure well and remained hemodynamically stable throughout. IMPRESSION: Status post CT-guided bone marrow biopsy, with tissue specimen sent to pathology for complete histopathologic analysis Signed, Dulcy Fanny. Earleen Newport, DO [facility] Electronically Signed   By: Corrie Mckusick D.O.   On: 11/06/2020 12:51   ECHOCARDIOGRAM COMPLETE  Result Date: 10/22/2020    ECHOCARDIOGRAM REPORT   Patient Name:   Creedence Hemphill Date of Exam: 10/22/2020 Medical Rec #:  [id number]      Height:       67.0 in Accession #:    [id number]     Weight:       191.0 lb Date of Birth:  [birth year]      BSA:          1.983 m Patient Age:    77 years       BP:           114/58 mmHg Patient Gender: F              HR:           61 bpm. Exam Location:  Inpatient Procedure: 2D Echo, Cardiac Doppler and Color Doppler Indications:    Atrial fibrillation  History:        Patient has no prior history of Echocardiogram examinations.                 Arrythmias:Atrial Fibrillation; Risk Factors:Hypertension.                  Breast and lung cancer, Renal failure.  Sonographer:    Dustin Flock Referring Phys: [place]  1. Left ventricular ejection fraction, by estimation, is 65 to 70%. The left ventricle has normal function. The left ventricle has no regional wall motion abnormalities. There is mild left ventricular hypertrophy. Left ventricular diastolic parameters are consistent with Grade I diastolic dysfunction (impaired relaxation).  2. Prominent moderator band. Right ventricular systolic function is hyperdynamic. The right ventricular size is normal. There is moderately elevated pulmonary artery systolic pressure.  3. Left atrial size was moderately dilated.  4. The mitral valve is grossly normal. Trivial mitral valve regurgitation.  5. The aortic valve is tricuspid. Aortic valve regurgitation is not visualized. Mild aortic valve sclerosis is present, with no evidence of aortic valve stenosis.  6. The inferior vena cava is normal in size with greater than 50% respiratory variability, suggesting right atrial pressure of 3 mmHg. FINDINGS  Left Ventricle: Left ventricular ejection fraction, by estimation, is 65 to 70%. The left ventricle has normal function. The left ventricle has no regional wall motion abnormalities. The left ventricular internal cavity size was normal in size. There is  mild left ventricular hypertrophy. Left ventricular diastolic parameters are consistent with Grade I diastolic dysfunction (impaired relaxation). Indeterminate filling pressures. Right Ventricle: Prominent moderator band. The right ventricular size is normal. No increase in right ventricular wall thickness. Right ventricular systolic function is hyperdynamic. There is moderately elevated pulmonary artery systolic  pressure. The tricuspid regurgitant velocity is 3.44 m/s, and with an assumed right atrial pressure of 3 mmHg, the estimated right ventricular systolic pressure is 78.9 mmHg. Left Atrium: Left atrial size was  moderately dilated. Right Atrium: Right atrial size was normal in size. Pericardium: There is no evidence of pericardial effusion. Mitral Valve: The mitral valve is grossly normal. Trivial mitral valve regurgitation. Tricuspid Valve: The tricuspid valve is grossly normal. Tricuspid valve regurgitation is trivial. Aortic Valve: The aortic valve is tricuspid. Aortic valve regurgitation is not visualized. Mild aortic valve sclerosis is present, with no evidence of aortic valve stenosis. Aortic valve peak gradient measures 15.1 mmHg. Pulmonic Valve: The pulmonic valve was grossly normal. Pulmonic valve regurgitation is trivial. Aorta: The aortic root and ascending aorta are structurally normal, with no evidence of dilitation. Venous: The inferior vena cava is normal in size with greater than 50% respiratory variability, suggesting right atrial pressure of 3 mmHg. IAS/Shunts: No atrial level shunt detected by color flow Doppler.  LEFT VENTRICLE PLAX 2D LVIDd:         3.40 cm  Diastology LVIDs:         1.80 cm  LV e' medial:    5.66 cm/s LV PW:         1.10 cm  LV E/e' medial:  13.9 LV IVS:        1.20 cm  LV e' lateral:   8.27 cm/s LVOT diam:     2.10 cm  LV E/e' lateral: 9.5 LV SV:         64 LV SV Index:   32 LVOT Area:     3.46 cm  RIGHT VENTRICLE RV Basal diam:  3.00 cm RV S prime:     13.70 cm/s TAPSE (M-mode): 2.4 cm LEFT ATRIUM             Index       RIGHT ATRIUM           Index LA diam:        3.30 cm 1.66 cm/m  RA Area:     19.70 cm LA Vol (A2C):   92.9 ml 46.85 ml/m RA Volume:   57.50 ml  29.00 ml/m LA Vol (A4C):   81.6 ml 41.15 ml/m LA Biplane Vol: 89.9 ml 45.33 ml/m  AORTIC VALVE AV Area (Vmax): 1.56 cm AV Vmax:        194.00 cm/s AV Peak Grad:   15.1 mmHg LVOT Vmax:      87.60 cm/s LVOT Vmean:     57.100 cm/s LVOT VTI:       0.186 m  AORTA Ao Root diam: 2.60 cm MITRAL VALVE               TRICUSPID VALVE MV Area (PHT): 3.37 cm    TR Peak grad:   47.3 mmHg MV Decel Time: 225 msec    TR Vmax:         344.00 cm/s MV E velocity: 78.50 cm/s MV A velocity: 52.80 cm/s  SHUNTS MV E/A ratio:  1.49        Systemic VTI:  0.19 m                            Systemic Diam: 2.10 cm Lyman Bishop MD Electronically signed by Lyman Bishop MD Signature Date/Time: 10/22/2020/4:36:05 PM    Final    VAS [place] LOWER EXTREMITY VENOUS (DVT)  Result Date: 10/23/2020  Lower Venous DVT Study Other Indications: BLE calf tenderness. Risk Factors: Hx of Cancer. Performing Technologist: Rogelia Rohrer  Examination Guidelines: A complete evaluation includes B-mode imaging, spectral Doppler, color Doppler, and power Doppler as needed of all accessible portions of each vessel. Bilateral testing is considered an integral part of a complete examination. Limited examinations for reoccurring indications may be performed as noted. The reflux portion of the exam is performed with the patient in reverse Trendelenburg.  +---------+---------------+---------+-----------+----------+--------------+ RIGHT    CompressibilityPhasicitySpontaneityPropertiesThrombus Aging +---------+---------------+---------+-----------+----------+--------------+ CFV      Full           Yes      Yes                                 +---------+---------------+---------+-----------+----------+--------------+ SFJ      Full                                                        +---------+---------------+---------+-----------+----------+--------------+ FV Prox  Full           Yes      Yes                                 +---------+---------------+---------+-----------+----------+--------------+ FV Mid   Full           Yes      Yes                                 +---------+---------------+---------+-----------+----------+--------------+ FV DistalFull           Yes      Yes                                 +---------+---------------+---------+-----------+----------+--------------+ PFV      Full                                                         +---------+---------------+---------+-----------+----------+--------------+ POP      Full           Yes      Yes                                 +---------+---------------+---------+-----------+----------+--------------+ PTV      Full                                                        +---------+---------------+---------+-----------+----------+--------------+ PERO     Full                                                        +---------+---------------+---------+-----------+----------+--------------+   +---------+---------------+---------+-----------+----------+--------------+  LEFT     CompressibilityPhasicitySpontaneityPropertiesThrombus Aging +---------+---------------+---------+-----------+----------+--------------+ CFV      Full           Yes      Yes                                 +---------+---------------+---------+-----------+----------+--------------+ SFJ      Full                                                        +---------+---------------+---------+-----------+----------+--------------+ FV Prox  Full           Yes      Yes                                 +---------+---------------+---------+-----------+----------+--------------+ FV Mid   Full           Yes      Yes                                 +---------+---------------+---------+-----------+----------+--------------+ FV DistalFull           Yes      Yes                                 +---------+---------------+---------+-----------+----------+--------------+ PFV      Full                                                        +---------+---------------+---------+-----------+----------+--------------+ POP      Full           Yes      Yes                                 +---------+---------------+---------+-----------+----------+--------------+ PTV      Full                                                         +---------+---------------+---------+-----------+----------+--------------+ PERO     Full                                                        +---------+---------------+---------+-----------+----------+--------------+     Summary: RIGHT: - There is no evidence of deep vein thrombosis in the lower extremity.  - A cystic structure is found in the popliteal fossa.   *See table(s) above for measurements and observations. Electronically signed by Curt Jews MD on 10/23/2020 at 8:02:53 PM.    Final      ASSESSMENT AND PLAN:  [name]  is an 87 year old female with pancytopenia.  The patient has had pancytopenia dating back to at least May 2020.  She is follow-up Dr. Delton Coombes at the [facility].  She was found to have vitamin B12 deficiency and was started on B12 replacement without significant improvement in her pancytopenia.  Due to worsening pancytopenia and development of additional symptoms including fatigue, anorexia, weight loss, a bone marrow biopsy was performed on 11/06/2020.  Results are pending  She does have a history of stage Ia left breast cancer which was diagnosed in September 1999 and treated with lumpectomy and radiation.  Thereafter, she received tamoxifen for 5 years.  She also has a history of stage Ia adenocarcinoma of the left upper lobe of the lung underwent lobectomy in July 2006.  I do not see that she has received systemic chemotherapy in the past.  ##Pancytopenia --Labs have been reviewed and she has a history of pancytopenia dating back to at least May 2020. --She has had some mild worsening of her pancytopenia over the past month.  However, has been hospitalized in medication including antibiotics could be contributing. --Differentials include MDS versus acute leukemia.  There were no circulating blasts noted on peripheral blood smear and acute leukemia thought to be less likely. --The patient had a bone marrow biopsy performed on 11/06/2010 and results  are currently pending. --Will await bone marrow biopsy results.  If the patient has MDS, recommend that she follow-up with her oncologist to [facility] as previously scheduled on 11/19/2020.  If there is evidence of acute leukemia in the bone marrow biopsy, recommend referral to [place] service. --Will add on copper level with tomorrow morning's labs. --Advised patient to monitor temperature at home once discharged and call for fever of 100.4 or higher. --Her grandson was updated by telephone today about the above plan.  Thank you for this referral.  Mikey Bussing, DNP, AGPCNP-BC, AOCNP Mon/Tues/Thurs/Fri 7am-5pm; Off Wednesdays Cell: [phone]

## 2020-11-07 NOTE — Progress Notes (Signed)
[facility] PHYSICAL MEDICINE & REHABILITATION PROGRESS NOTE   Subjective/Complaints:  Pt reports sore from biopsy-  LBM overnight Had some LLE pain yesterday 6/10- resolved with therapy.    ROS:   Pt denies SOB, abd pain, CP, N/V/C/D, and vision changes   Objective:   CT BONE MARROW BIOPSY  Result Date: 11/06/2020 INDICATION: 87 year old female with a history of pancytopenia EXAM: CT BIOPSY BONE MARROW MEDICATIONS: None. ANESTHESIA/SEDATION: No moderate sedation was employed during this procedure. A total of Versed 0 mg and Fentanyl 25 mcg was administered intravenously. No moderate sedation. The patient's level of consciousness and vital signs were monitored continuously by radiology nursing throughout the procedure under my direct supervision. FLUOROSCOPY TIME:  CT COMPLICATIONS: None PROCEDURE: The procedure risks, benefits, and alternatives were explained to the patient. Questions regarding the procedure were encouraged and answered. The patient understands and consents to the procedure. Scout CT of the pelvis was performed for surgical planning purposes. The posterior pelvis was prepped with Chlorhexidine in a sterile fashion, and a sterile drape was applied covering the operative field. A sterile gown and sterile gloves were used for the procedure. Local anesthesia was provided with 1% Lidocaine. Posterior right iliac bone was targeted for biopsy. The skin and subcutaneous tissues were infiltrated with 1% lidocaine without epinephrine. A small stab incision was made with an 11 blade scalpel, and an 11 gauge Murphy needle was advanced with CT guidance to the posterior cortex. Manual forced was used to advance the needle through the posterior cortex and the stylet was removed. A bone marrow aspirate was retrieved and passed to a cytotechnologist in the room. The Murphy needle was then advanced without the stylet for a core biopsy. The core biopsy was retrieved and also passed to a  cytotechnologist. Manual pressure was used for hemostasis and a sterile dressing was placed. No complications were encountered no significant blood loss was encountered. Patient tolerated the procedure well and remained hemodynamically stable throughout. IMPRESSION: Status post CT-guided bone marrow biopsy, with tissue specimen sent to pathology for complete histopathologic analysis Signed, Dulcy Fanny. Earleen Newport, DO [facility] Electronically Signed   By: Corrie Mckusick D.O.   On: 11/06/2020 12:51   Recent Labs    11/06/20 0555 11/07/20 0456  WBC 2.8* 2.7*  HGB 9.0* 8.2*  HCT 31.6* 27.9*  PLT 85* 73*   Recent Labs    11/06/20 0555 11/07/20 0456  NA 135 135  K 4.3 4.6  CL 99 99  CO2 27 27  GLUCOSE 95 97  BUN 10 9  CREATININE 0.98 1.05*  CALCIUM 9.0 8.6*    Intake/Output Summary (Last 24 hours) at 11/07/2020 0915 Last data filed at 11/06/2020 1225 Gross per 24 hour  Intake 256 ml  Output -  Net 256 ml        Physical Exam: Vital Signs Blood pressure (!) 99/52, pulse (!) 117, temperature 98.3 F (36.8 C), temperature source Oral, resp. rate 16, height '5\' 7"'  (1.702 m), weight 93.2 kg, SpO2 97 %. Constitutional:sitting up in bedside chair, appropriate, OT in room, NAD HENT: Normocephalic.  Atraumatic. Eyes: EOMI. No discharge. Cardiovascular: tachycardic-   Respiratory: CTA B/L- no W/R/R- good air movement GI: Soft, NT, ND, (+)BS   Skin: Warm and dry.  Intact. Psych: appropriate, anxious due to biopsy Musc: Mild lower extreme edema No tenderness in extremities. Neuro: Alert HOH Bilateral upper extremities: 5/5 proximal distal  Bilateral lower extremities: Hip flexion: 4-4+/5, distally 5/5, stable  Assessment/Plan: 1. Functional deficits which require 3+ hours per day of interdisciplinary therapy in a comprehensive inpatient rehab setting.  Physiatrist is providing close team supervision and 24 hour management of  active medical problems listed below.  Physiatrist and rehab team continue to assess barriers to discharge/monitor patient progress toward functional and medical goals  Care Tool:  Bathing    Body parts bathed by patient: Right arm,Left arm,Chest,Abdomen,Front perineal area,Right upper leg,Left upper leg,Face,Buttocks,Right lower leg,Left lower leg   Body parts bathed by helper: Right lower leg,Left lower leg     Bathing assist Assist Level: Contact Guard/Touching assist     Upper Body Dressing/Undressing Upper body dressing   What is the patient wearing?: Pull over shirt    Upper body assist Assist Level: Set up assist    Lower Body Dressing/Undressing Lower body dressing      What is the patient wearing?: Pants,Underwear/pull up     Lower body assist Assist for lower body dressing: Contact Guard/Touching assist     Toileting Toileting    Toileting assist Assist for toileting: Contact Guard/Touching assist     Transfers Chair/bed transfer  Transfers assist     Chair/bed transfer assist level: Contact Guard/Touching assist     Locomotion Ambulation   Ambulation assist      Assist level: Contact Guard/Touching assist Assistive device: Walker-rolling Max distance: 157'   Walk 10 feet activity   Assist     Assist level: Contact Guard/Touching assist Assistive device: Walker-rolling   Walk 50 feet activity   Assist Walk 50 feet with 2 turns activity did not occur: Safety/medical concerns  Assist level: Contact Guard/Touching assist Assistive device: Walker-rolling    Walk 150 feet activity   Assist Walk 150 feet activity did not occur: Safety/medical concerns  Assist level: Contact Guard/Touching assist Assistive device: Walker-rolling    Walk 10 feet on uneven surface  activity   Assist Walk 10 feet on uneven surfaces activity did not occur: Safety/medical concerns         Wheelchair     Assist Will patient use  wheelchair at discharge?: No Type of Wheelchair: Manual    Wheelchair assist level: Supervision/Verbal cueing Max wheelchair distance: 130'    Wheelchair 50 feet with 2 turns activity    Assist        Assist Level: Supervision/Verbal cueing   Wheelchair 150 feet activity     Assist      Assist Level: Maximal Assistance - Patient 25 - 49% (pt able to propel 70 feet)   Blood pressure (!) 99/52, pulse (!) 117, temperature 98.3 F (36.8 C), temperature source Oral, resp. rate 16, height '5\' 7"'  (1.702 m), weight 93.2 kg, SpO2 97 %.    Medical Problem List and Plan: 1. Debility secondary to AKI superimposed on CKD stage III/hyponatremia/gout/atrial fibrillation  Continue CIR  1/4- doing ADLs min A and walking 100+ ft with RW min A per therapy- will arrange for d/c date likely later this week. 1/5- set d/c date as 1/11- suggested to family to move up Heme/Onc appointment.   2. Antithrombotics: -DVT/anticoagulation: SCDs -antiplatelet therapy: N/A 3. Pain Management: Tylenol as needed            Controlled on 1/2 4. Mood: Provide emotional support -antipsychotic agents: N/A 5. Neuropsych: This patient is capable of making decisions on her own behalf. 6. Skin/Wound Care: Routine skin checks 7. Fluids/Electrolytes/Nutrition:  8. Acute new onset atrial fibrillation: Monitor with increased exertion.  1/5- rate controlled- con't  meds  1/6- rate up to 117 with therapy- will monitor and see if doesn't come down, migh tneed EKG? 9. H. pylori positive. Completed triple regimen therapy and then maintain Protonix twice daily 10. Bilateral lower extremity knee pain. Arthrocentesis of both knees. Fluid analysis no evidence of infection but urate crystals diagnostic gouty arthritis. Continue allopurinol. No colchicine due to AKI            1/5- d/c allopurinol since can lower ANC? Tylenol helping pain.  Monitor with increased activity 11.  Hypothyroidism: Synthroid 12. Decreased nutritional storage. Dietary follow-up 13. AKI superimposed on CKD stage IIIa.  Creatinine 1.03 on 12/30, labs ordered for tomorrow  1/5- Cr 0.98- con't regimen  1/6- Cr 1.05- fluctuating slightly- will monitor 14. History of breast cancer in 1999 treated with lumpectomy radiation as well as tamoxifen. Follow-up outpatient 15. History of lung cancer. Status post partial upper lobe lobectomy 2006. Follow-up outpatient 16. Anemia of chronic disease/pancytopenia. Patient has been seen by hematology services outpatient. Hemoglobin 8.0 on 12/30, labs ordered for tomorrow 17. Low albumin: encourage intake of high protein foods.  18.  Labile blood pressure  Relatively controlled on 1/2 19.  Hyponatremia  Sodium 134 on 12/30, labs ordered for tomorrow  1/4- Na up to 135- from 122 at admission- con't regimen 20. Hypomagnesemia  12/3- will replete Mg 800 mg BID for Mg of 1.3- K+ is OK- and recheck labs in AM  1/5- will recheck Mg level- was 1.4 yesterday.  21. ANC critically low/pancytopenia.   1/3- will Consult HemeOnc- esp with her hx of Cancer x2- will check Iron, TIBC, ferritin, folate and Vit B12- and daily CBC with diff per Oncology- Dr Lorenso Courier and then if they are normal, migh tneed bone marrow biopsy.   1/4- spoke with grandson- they were planning to do bone marrow biopsy before admission "in the next few weeks"- labs checking iron, etc were OK- will likely need Bone marrow biopsy- but pt would rather do outpt- she wants to go home. D/w grandson, Rosalita Chessman- they've been thinking MDS for 2+ years, but hadn't done bone marrow biopsy yet.   1/5- arranged for bone marrow biopsy today- going now.   1/6- spoke with Heme/Onc due to pancytopenia getting worse- they need to wait for bone marrow biopsy results to intervene- will do a form consult today 22. Insomnia  1/3- write for trazodone for sleep.  1/4- slept MUCH better  23.  Thrombocytopenia  1/5- down to 85k- off blood thinners.   1/6- Plts down to 73k- as per #21.     LOS: 8 days A FACE TO FACE EVALUATION WAS PERFORMED  Anacleto Batterman 11/07/2020, 9:15 AM

## 2020-11-07 NOTE — Progress Notes (Signed)
Initial Nutrition Assessment  RD working remotely.  DOCUMENTATION CODES:   Obesity unspecified  INTERVENTION:   - Continue Ensure Enlive po BID, each supplement provides 350 kcal and 20 grams of protein  - Continue MVI with minerals daily  - "Food Safety Nutrition Therapy" handout attached to AVS/Discharge Instructions; RD will follow for ability to provide diet education prior to discharge  NUTRITION DIAGNOSIS:   Increased nutrient needs related to acute illness as evidenced by estimated needs.  GOAL:   Patient will meet greater than or equal to 90% of their needs  MONITOR:   PO intake,Supplement acceptance,Labs,Weight trends,Skin  REASON FOR ASSESSMENT:   Consult Diet education  ASSESSMENT:   87 year old female with PMH of breast cancer in 1999 treated with lumpectomy and radiation as well as tamoxifen for 5 years, lung cancer s/p partial upper lobe lobectomy in 2006, AKI thought secondary to ARB, pancytopenia followed by hematology services, hypothyroidism, and gout. Pt presented on 10/21/20 with generalized weakness, nausea, weight loss, and decreased oral intake x 3 weeks. Pt tested positive for H. pylori and was started on triple therapy regimen x 2 weeks total. Admitted to CIR on 10/30/20.   1/05 - s/p bone marrow biopsy  Noted target d/c date of 11/12/20.  RD consulted for diet education regarding neutropenic diet.  RD attempted several times to reach pt via phone call to room but was unsuccessful. Phone line busy on all attempts. Will follow for ability to provide food safety diet education prior to discharge. RD has attached "Food Safety Nutrition Therapy" handout from the [employer] to pt's Discharge Instructions.  Reviewed RD notes from acute admission. Pt met criteria for severe acute malnutrition. Suspect malnutrition persists but unable to confirm without NFPE.  Pt with Ensure ordered BID and is accepting this supplement when  offered 100% of the time per [facility] documentation. Will continue with current supplement regimen.  Reviewed weight history in chart. No new weighs since 12/29. Pt with weight gain from 12/16 to 12/29 but weight loss prior to this.  Meal Completion: 75-100%  Medications reviewed and include: Ensure Enlive BID, lasix, magnesium oxide 800 mg BID, MVI with minerals daily, nystatin, protonix, senna  Labs reviewed: creatinine 1.05, magnesium 1.6, hemoglobin 8.2  NUTRITION - FOCUSED PHYSICAL EXAM:  Unable to complete at this time. RD working remotely.  Diet Order:   Diet Order            Diet regular Room service appropriate? Yes; Fluid consistency: Thin  Diet effective now                 EDUCATION NEEDS:   Education needs have been addressed  Skin:  Skin Assessment: Skin Integrity Issues: Incisions: lower back s/p bone marrow biopsy  Last BM:  11/06/20  Height:   Ht Readings from Last 1 Encounters:  10/30/20 '5\' 7"'  (1.702 m)    Weight:   Wt Readings from Last 1 Encounters:  10/30/20 93.2 kg    BMI:  Body mass index is 32.18 kg/m.  Estimated Nutritional Needs:   Kcal:  1800-2000  Protein:  90-110 grams  Fluid:  1.8-2.0 L    Gustavus Bryant, MS, RD, LDN Inpatient Clinical Dietitian Please see AMiON for contact information.

## 2020-11-07 NOTE — Progress Notes (Signed)
Occupational Therapy Weekly Progress Note  Patient Details  Name: Lisa [name] MRN: [id number] Date of Birth: [birth year]  Beginning of progress report period: October 31, 2020 End of progress report period: November 07, 2020  Today's Date: 11/07/2020 OT Individual Time: 9323-5573 and 1415-1510 OT Individual Time Calculation (min): 73 min and 55 min   Patient has met 4 of 4 short term goals.  Pt with consistent progress toward OT goals at Supervision level to go home with family to assist. Pt is currently CGA overall for LB bathing/dressing with ocassional use of LHS and reacher for reaching feet when knees are stiff, CGA for walking to/from bathroom with RW and performs clothing management and hygiene in same manner. Planning for family ed with daughter/family members   Patient continues to demonstrate the following deficits: muscle weakness, decreased cardiorespiratoy endurance,  , decreased safety awareness and decreased standing balance, decreased postural control and decreased balance strategies and therefore will continue to benefit from skilled OT intervention to enhance overall performance with BADL.  Patient progressing toward long term goals..  Continue plan of care.  OT Short Term Goals Week 1:  OT Short Term Goal 1 (Week 1): Pt will perform 3/3 toilet tasks with CGA OT Short Term Goal 1 - Progress (Week 1): Met OT Short Term Goal 2 (Week 1): Pt will consistently perform sit to stand at RW with CGA for LB ADL OT Short Term Goal 2 - Progress (Week 1): Met OT Short Term Goal 3 (Week 1): Pt will perform LB dress with compensatory techniques PRN with CGA OT Short Term Goal 3 - Progress (Week 1): Met OT Short Term Goal 4 (Week 1): pt will perform activity/ADL for 10+ mins without fatigue OT Short Term Goal 4 - Progress (Week 1): Met Week 2:  OT Short Term Goal 1 (Week 2): STGs = LTGs d/t ELOS at Supervision level  Skilled Therapeutic Interventions/Progress Updates:    Session 1: Pt  greeted at time of session sitting up in recliner agreeable to OT session with some knee stiffness but no pain reported. Agreeable to ADL, daughter Lisa [name] not present this morning for session. Pt ambulated chair > sink CGA with RW and Min A initially to stand but CGA throughout rest of session. Performed UB/LB bathing at sink level as she does not like full showers with CGA overall, demonstrated use of LHS as well to reach her feet d/t stiffness, able to wash buttocks in standing as well. UB dress set up, LB dress CGA with pt able to reach forward and thread underwear and pants, assisted with putting on pad. Pt needed to use the bathroom at this time, walked to/from bathroom CGA with RW and performed toilet tasks in same manner. Once back in recliner hand off to nursing for vitals check. Note band aide coming off of site on low back from biopsy yseterday and RN aware. Discussion througout session for need for supervision when going home.   Session 2: Pt greeted at time of session sitting up in recliner agreeable to OT session, saying she had a long and busy day but she is agreeable to OT with encouragement. Note DME has been delivered and reviewed with pt its uses and where to place in home, adjusted height of BSC and walker to match her current one. Extensive discussion/DC planning regarding home set up and need for supervision at home. PA for heme/onc entered at this time and discussed biopsy and pending results with pt. Pt agreeable to trial functional  mobility with new RW to ensure good fit, walked to/from bathroom CGA with RW and transferred to/from commode with BSC over top in same manner. Once in recliner, pt ed on proper form for sit to stands, 3 trials of sit to stands with improved form each attempt for pushing up from handles and controlling descent. Pt declined furhter activity out of room today. Up in recliner with alarm on call bell in reach. Aware of family ed tomororw.    Therapy  Documentation Precautions:  Precautions Precautions: Fall Precaution Comments: unsteady knees Restrictions Weight Bearing Restrictions: No    Therapy/Group: Individual Therapy  Viona Gilmore 11/07/2020, 7:05 AM

## 2020-11-08 ENCOUNTER — Inpatient Hospital Stay (HOSPITAL_COMMUNITY): Payer: Medicare Other

## 2020-11-08 ENCOUNTER — Inpatient Hospital Stay (HOSPITAL_COMMUNITY): Payer: Medicare Other | Admitting: Occupational Therapy

## 2020-11-08 ENCOUNTER — Other Ambulatory Visit (HOSPITAL_COMMUNITY): Payer: Self-pay

## 2020-11-08 DIAGNOSIS — D61818 Other pancytopenia: Secondary | ICD-10-CM

## 2020-11-08 DIAGNOSIS — C50912 Malignant neoplasm of unspecified site of left female breast: Secondary | ICD-10-CM

## 2020-11-08 LAB — BASIC METABOLIC PANEL
Anion gap: 1 — ABNORMAL LOW (ref 5–15)
BUN: 8 mg/dL (ref 8–23)
CO2: 34 mmol/L — ABNORMAL HIGH (ref 22–32)
Calcium: 8.6 mg/dL — ABNORMAL LOW (ref 8.9–10.3)
Chloride: 101 mmol/L (ref 98–111)
Creatinine, Ser: 0.93 mg/dL (ref 0.44–1.00)
GFR, Estimated: >60 mL/min (ref >60)
Glucose, Bld: 91 mg/dL (ref 70–99)
Potassium: 4.3 mmol/L (ref 3.5–5.1)
Sodium: 136 mmol/L (ref 135–145)

## 2020-11-08 NOTE — Progress Notes (Signed)
Physical Therapy Session Note  Patient Details  Name: Lisa [name] MRN: [id number] Date of Birth: [birth year]  Today's Date: 11/08/2020 PT Individual Time: 1300-1410 PT Individual Time Calculation (min): 70 min   Short Term Goals: Week 1:  PT Short Term Goal 1 (Week 1): Patient to perform transfers with min A. PT Short Term Goal 1 - Progress (Week 1): Met PT Short Term Goal 2 (Week 1): Patient to ambulate 10' with RW and min A. PT Short Term Goal 2 - Progress (Week 1): Met PT Short Term Goal 3 (Week 1): Patient to negotiate 8 short steps with rails and min A PT Short Term Goal 3 - Progress (Week 1): Met Week 2:  PT Short Term Goal 1 (Week 2): STG=LTG due to ELOS  Skilled Therapeutic Interventions/Progress Updates:   Received pt sitting in recliner, pt agreeable to therapy, and denied any pain during session but reported "soreness" in bilateral knees. Session with emphasis on functional mobility/transfers, generalized strengthening, dynamic standing balance/coordinaiton, ambulation, and improved activity tolerance. Pt Transferred recliner<>WC without AD and CGA and performed WC mobility 132f using BUE and supervision to therapy gym and transported remainder of way in WC total A to dayroom. Pt ambulated 1829fwith RW and CGA. Pt demonstrates decreased cadence, flexed trunk, step through pattern, and decreased stride length. Worked on dynamic standing balance playing cornhole without AD and CGA/min A for balance x 2 trials. Worked on dynamic standing balance performing alternating toe taps to 3in step with mod handheld assist 2x12 reps. Pt ambulated additional 2589f 2 trials with RW and CGA to/from Nustep and performed BUE/LE strengthening on Nustep at workload 3 for 10 minutes for a total of 330 steps for improved cardiovascular endurance. Pt reported her legs felt tired after activity. Attempted sit<>[facility], however pt unable to push into standing. Pt performed x 5  sit<>stands with UE support on WC armrests and CGA/min A. Pt [facility] and transferred WC<>recliner stand<>pivot with RW and CGA. Concluded session with pt sitting in recliner, needs within reach, and chair pad alarm on.   Therapy Documentation Precautions:  Precautions Precautions: Fall Precaution Comments: unsteady knees Restrictions Weight Bearing Restrictions: No  Therapy/Group: Individual Therapy AnnAlfonse Alpers, DPT   11/08/2020, 7:24 AM

## 2020-11-08 NOTE — Progress Notes (Signed)
[facility] PHYSICAL MEDICINE & REHABILITATION PROGRESS NOTE   Subjective/Complaints:  Pt reports still sore on medial knees B/L- but wants some tylenol, nothing else.  Feels good otherwise, except some soreness, which is better from bone marrow biopsy.   LBM that was small overnight- then had a good BM the night prior.  No Sx's of infection- of UTI, breathing issues, etc.    ROS:   Pt denies SOB, abd pain, CP, N/V/C/D, and vision changes    Objective:   No results found. Recent Labs    11/07/20 0456 11/08/20 0502  WBC 2.7* 2.5*  HGB 8.2* 8.1*  HCT 27.9* 28.0*  PLT 73* 67*   Recent Labs    11/07/20 0456 11/08/20 0502  NA 135 136  K 4.6 4.3  CL 99 101  CO2 27 34*  GLUCOSE 97 91  BUN 9 8  CREATININE 1.05* 0.93  CALCIUM 8.6* 8.6*    Intake/Output Summary (Last 24 hours) at 11/08/2020 1047 Last data filed at 11/08/2020 0941 Gross per 24 hour  Intake 832 ml  Output --  Net 832 ml        Physical Exam: Vital Signs Blood pressure 110/64, pulse (!) 114, temperature 99.9 F (37.7 C), temperature source Oral, resp. rate (!) 22, height '5\' 7"'  (1.702 m), weight 93.2 kg, SpO2 91 %. Constitutional: sitting up in bedside chair, appropriate, smiling, NAD HENT: Normocephalic.  Atraumatic. Eyes: EOMI. No discharge. Cardiovascular: tachycardic-   Respiratory: CTA B/L- no W/R/R- good air movement GI: Soft, NT, ND, (+)BS  Skin: Warm and dry.  Intact. Psych: appropriate Musc: Mild lower extreme edema No tenderness in extremities. Neuro: Alert HOH Bilateral upper extremities: 5/5 proximal distal  Bilateral lower extremities: Hip flexion: 4-4+/5, distally 5/5, stable  Assessment/Plan: 1. Functional deficits which require 3+ hours per day of interdisciplinary therapy in a comprehensive inpatient rehab setting.  Physiatrist is providing close team supervision and 24 hour management of active medical problems listed below.  Physiatrist and rehab team continue to assess  barriers to discharge/monitor patient progress toward functional and medical goals  Care Tool:  Bathing    Body parts bathed by patient: Right arm,Left arm,Chest,Abdomen,Front perineal area,Right upper leg,Left upper leg,Face,Buttocks,Right lower leg,Left lower leg   Body parts bathed by helper: Right lower leg,Left lower leg     Bathing assist Assist Level: Contact Guard/Touching assist     Upper Body Dressing/Undressing Upper body dressing   What is the patient wearing?: Pull over shirt    Upper body assist Assist Level: Set up assist    Lower Body Dressing/Undressing Lower body dressing      What is the patient wearing?: Pants,Underwear/pull up     Lower body assist Assist for lower body dressing: Contact Guard/Touching assist     Toileting Toileting    Toileting assist Assist for toileting: Contact Guard/Touching assist     Transfers Chair/bed transfer  Transfers assist     Chair/bed transfer assist level: Contact Guard/Touching assist     Locomotion Ambulation   Ambulation assist      Assist level: Contact Guard/Touching assist Assistive device: Walker-rolling Max distance: 157'   Walk 10 feet activity   Assist     Assist level: Contact Guard/Touching assist Assistive device: Walker-rolling   Walk 50 feet activity   Assist Walk 50 feet with 2 turns activity did not occur: Safety/medical concerns  Assist level: Contact Guard/Touching assist Assistive device: Walker-rolling    Walk 150 feet activity   Assist Walk 150  feet activity did not occur: Safety/medical concerns  Assist level: Contact Guard/Touching assist Assistive device: Walker-rolling    Walk 10 feet on uneven surface  activity   Assist Walk 10 feet on uneven surfaces activity did not occur: Safety/medical concerns         Wheelchair     Assist Will patient use wheelchair at discharge?: No Type of Wheelchair: Manual    Wheelchair assist level:  Supervision/Verbal cueing Max wheelchair distance: 130'    Wheelchair 50 feet with 2 turns activity    Assist        Assist Level: Supervision/Verbal cueing   Wheelchair 150 feet activity     Assist      Assist Level: Maximal Assistance - Patient 25 - 49% (pt able to propel 70 feet)   Blood pressure 110/64, pulse (!) 114, temperature 99.9 F (37.7 C), temperature source Oral, resp. rate (!) 22, height '5\' 7"'  (1.702 m), weight 93.2 kg, SpO2 91 %.    Medical Problem List and Plan: 1. Debility secondary to AKI superimposed on CKD stage III/hyponatremia/gout/atrial fibrillation  Continue CIR  1/4- doing ADLs min A and walking 100+ ft with RW min A per therapy- will arrange for d/c date likely later this week. 1/5- set d/c date as 1/11- suggested to family to move up Heme/Onc appointment.    1/7- Heme/ Onc saw pt yesterday and formal consult done-   2. Antithrombotics: -DVT/anticoagulation: SCDs -antiplatelet therapy: N/A 3. Pain Management: Tylenol as needed            Controlled on 1/2 4. Mood: Provide emotional support -antipsychotic agents: N/A 5. Neuropsych: This patient is capable of making decisions on her own behalf. 6. Skin/Wound Care: Routine skin checks 7. Fluids/Electrolytes/Nutrition:  8. Acute new onset atrial fibrillation: Monitor with increased exertion.  1/5- rate controlled- con't meds  1/6- rate up to 117 with therapy- will monitor and see if doesn't come down, migh tneed EKG?  1/7- HR in 100s when listened today will get EKG to look at HR.  9. H. pylori positive. Completed triple regimen therapy and then maintain Protonix twice daily 10. Bilateral lower extremity knee pain. Arthrocentesis of both knees. Fluid analysis no evidence of infection but urate crystals diagnostic gouty arthritis. Continue allopurinol. No colchicine due to AKI            1/5- d/c allopurinol since can lower ANC? Tylenol helping pain.   Monitor with increased activity 11. Hypothyroidism: Synthroid 12. Decreased nutritional storage. Dietary follow-up 13. AKI superimposed on CKD stage IIIa.  Creatinine 1.03 on 12/30, labs ordered for tomorrow  1/5- Cr 0.98- con't regimen  1/6- Cr 1.05- fluctuating slightly- will monitor  1/7- Cr 0.93 14. History of breast cancer in 1999 treated with lumpectomy radiation as well as tamoxifen. Follow-up outpatient 15. History of lung cancer. Status post partial upper lobe lobectomy 2006. Follow-up outpatient 16. Anemia of chronic disease/pancytopenia. Patient has been seen by hematology services outpatient. Hemoglobin 8.0 on 12/30, labs ordered for tomorrow 17. Low albumin: encourage intake of high protein foods.  18.  Labile blood pressure  Relatively controlled on 1/2 19.  Hyponatremia  Sodium 134 on 12/30, labs ordered for tomorrow  1/4- Na up to 135- from 122 at admission- con't regimen 20. Hypomagnesemia  12/3- will replete Mg 800 mg BID for Mg of 1.3- K+ is OK- and recheck labs in AM  1/5- will recheck Mg level- was 1.4 yesterday.   1/7- Mg was 1.6 yesterday -doing better  21. ANC critically low/pancytopenia.   1/3- will Consult HemeOnc- esp with her hx of Cancer x2- will check Iron, TIBC, ferritin, folate and Vit B12- and daily CBC with diff per Oncology- Dr Lorenso Courier and then if they are normal, migh tneed bone marrow biopsy.   1/4- spoke with grandson- they were planning to do bone marrow biopsy before admission "in the next few weeks"- labs checking iron, etc were OK- will likely need Bone marrow biopsy- but pt would rather do outpt- she wants to go home. D/w grandson, Rosalita Chessman- they've been thinking MDS for 2+ years, but hadn't done bone marrow biopsy yet.   1/5- arranged for bone marrow biopsy today- going now.   1/6- spoke with Heme/Onc due to pancytopenia getting worse- they need to wait for bone marrow biopsy results to intervene- will  do a form consult today  1/7- Heme/Onc made no additional recs in their consult yesterday- waiting for bone marrow biopsy- ANC up slightly to 0.3- but plts down to 67k and Hb down slightly to 8.1-  22. Insomnia  1/3- write for trazodone for sleep.  1/4- slept MUCH better  23. Thrombocytopenia  1/5- down to 85k- off blood thinners.   1/6- Plts down to 73k- as per #21.   1/7- Plts 67k- will monitor- no active bleeding!  off blood thinners.     LOS: 9 days A FACE TO FACE EVALUATION WAS PERFORMED  Lisa [name] 11/08/2020, 10:47 AM

## 2020-11-08 NOTE — Progress Notes (Signed)
Occupational Therapy Session Note  Patient Details  Name: Lisa [name] MRN: [id number] Date of Birth: 01-22-1934  Today's Date: 11/08/2020 OT Individual Time: 2481-8590 OT Individual Time Calculation (min): 60 min    Short Term Goals: Week 1:  OT Short Term Goal 1 (Week 1): Pt will perform 3/3 toilet tasks with CGA OT Short Term Goal 1 - Progress (Week 1): Met OT Short Term Goal 2 (Week 1): Pt will consistently perform sit to stand at RW with CGA for LB ADL OT Short Term Goal 2 - Progress (Week 1): Met OT Short Term Goal 3 (Week 1): Pt will perform LB dress with compensatory techniques PRN with CGA OT Short Term Goal 3 - Progress (Week 1): Met OT Short Term Goal 4 (Week 1): pt will perform activity/ADL for 10+ mins without fatigue OT Short Term Goal 4 - Progress (Week 1): Met Week 2:  OT Short Term Goal 1 (Week 2): STGs = LTGs d/t ELOS at Supervision level     Skilled Therapeutic Interventions/Progress Updates:    Pt received in recliner ready for therapy. She declined a full bath or changing LB clothing today. She did want to change her top.  Recommended she use her RW to walk to sink to wash up UB and change shirts in standing. She was able to do all of this with CGA.  She is consistently performing sit to stands with S.   Pt then transported via wc to tub room to practice stepping in and out of tub, which she does at home to sponge bathe in tub. She had quite a bit of difficulty lifting her leg high enough to get over the tub wall.  Recommended pt use a tub bench. Pt practiced tub bench transfers which she was able to do well with supervision. She will talk to her family about getting a tub bench.  Pt then transported to kitchen where she practiced standing and reaching into pantry and cupboard.   Pt able to do this well with CGA.  Pt then transported to gym to meet with her PT for her next session.   Therapy Documentation Precautions:  Precautions Precautions: Fall Precaution  Comments: unsteady knees Restrictions Weight Bearing Restrictions: No    Vital Signs: Therapy Vitals Temp: 99.9 F (37.7 C) Temp Source: Oral Pulse Rate: (!) 114 Resp: (!) 22 BP: 110/64 Patient Position (if appropriate): Sitting Oxygen Therapy SpO2: 91 % O2 Device: Room Air Pain: Pain Assessment Pain Scale: 0-10 Pain Score: 0-No pain   Therapy/Group: Individual Therapy  [place] 11/08/2020, 8:59 AM

## 2020-11-08 NOTE — Progress Notes (Signed)
Physical Therapy Session Note  Patient Details  Name: Lisa [name] MRN: [id number] Date of Birth: [birth year]  Today's Date: 11/08/2020 PT Individual Time: 0930-1030 PT Individual Time Calculation (min): 60 min   Short Term Goals: Week 2:  PT Short Term Goal 1 (Week 2): STG=LTG due to ELOS  Skilled Therapeutic Interventions/Progress Updates:    Patient in the gym s/p OT session.  Reports slept ok, noted new walker delivered and discussed she already has two at home and a rollator so does not want to keep this one.  SW made aware.  Patient sit to stand to RW with CGA and cues for hand placement.  Ambulated 12' with RW and CGA.  Negotiated 4 steps with rails and min A, mod cues for sequencing noting stepping up with R first more unstable than L.  Patient educated with demonstration for sit to stand including weight shift, hand placement and pushing up rather than pulling up.  Patient performed with RW from mat then from armchair with CGA and cues.  TUG performed with results as below.  Patient worked on standing balance tapping forward one foot at a time with 1 UE support on walker then with SPC.  Gait with SPC x 12' with min A slow and pt c/o knee soreness.  Assisted in w/c to ortho gym and pt performed standing balance activity on BITS touching targets (numbers 1-20) in center third of screen 2 x 2+ minutes without seated rest and with S with 1 UE supported.  Patient assisted in w/c to room and stand step to recliner with min A.  Left with chair alarm active and needs in reach.  Therapy Documentation Precautions:  Precautions Precautions: Fall Precaution Comments: unsteady knees Restrictions Weight Bearing Restrictions: No Pain: Pain Assessment Pain Scale: 0-10 Pain Score: 5  Pain Type: Chronic pain Pain Location: Knee Pain Orientation: Right;Left Pain Onset: On-going Pain Intervention(s): Rest    Balance: Standardized Balance Assessment Standardized Balance Assessment: Timed Up and  Go Test Timed Up and Go Test TUG: Normal TUG Normal TUG (seconds): 65 (with walker and CGA and cues)   Therapy/Group: Individual Therapy  [name]  [place], PT 11/08/2020, 10:09 AM

## 2020-11-08 NOTE — Progress Notes (Addendum)
Patient ID: Lisa [name], female   DOB: [birth year], 87 y.o.   MRN: [id number]  Pt's daughter did not come for family education this am due to weather issues when she got off form work this am at 7:15. She does not drive in ice or bad weather. Will need to re-schedule education prior to discharge Tuesday. Pt has no preference for home health agencies, has never used one before. [place] with referral to [facility] since services [place] area. Continue to work on discharge for Tuesday.  1;35 PM Spoke with pt's grandson who is back in [place] he anted home health and private duty list. Have emailed them to him. Will follow up with on Monday.

## 2020-11-09 ENCOUNTER — Inpatient Hospital Stay (HOSPITAL_COMMUNITY): Payer: Medicare Other

## 2020-11-09 DIAGNOSIS — D696 Thrombocytopenia, unspecified: Secondary | ICD-10-CM

## 2020-11-09 LAB — BASIC METABOLIC PANEL
Anion gap: 9 (ref 5–15)
BUN: 6 mg/dL — ABNORMAL LOW (ref 8–23)
CO2: 25 mmol/L (ref 22–32)
Calcium: 8.6 mg/dL — ABNORMAL LOW (ref 8.9–10.3)
Chloride: 99 mmol/L (ref 98–111)
Creatinine, Ser: 0.99 mg/dL (ref 0.44–1.00)
GFR, Estimated: 56 mL/min — ABNORMAL LOW (ref >60)
Glucose, Bld: 91 mg/dL (ref 70–99)
Potassium: 4.6 mmol/L (ref 3.5–5.1)
Sodium: 133 mmol/L — ABNORMAL LOW (ref 135–145)

## 2020-11-09 NOTE — Progress Notes (Signed)
[facility] PHYSICAL MEDICINE & REHABILITATION PROGRESS NOTE   Subjective/Complaints:  Pt without complaints when I saw her earlier this morning. RN called me back just now as pt appeared more lethargic than baseline. When I stopped back in she was feeling fine. Said she fell off to sleep and had a hard time waking back up.   ROS: Limited due to cognitive/behavioral    Objective:   No results found. Recent Labs    11/08/20 0502 11/09/20 0503  WBC 2.5* 2.7*  HGB 8.1* 8.7*  HCT 28.0* 29.0*  PLT 67* 68*   Recent Labs    11/08/20 0502 11/09/20 0503  NA 136 133*  K 4.3 4.6  CL 101 99  CO2 34* 25  GLUCOSE 91 91  BUN 8 6*  CREATININE 0.93 0.99  CALCIUM 8.6* 8.6*    Intake/Output Summary (Last 24 hours) at 11/09/2020 0902 Last data filed at 11/09/2020 0717 Gross per 24 hour  Intake 952 ml  Output --  Net 952 ml        Physical Exam: Vital Signs Blood pressure (!) 105/48, pulse 79, temperature 98.4 F (36.9 C), resp. rate 20, height _0  (1.702 m), weight 93.2 kg, SpO2 90 %. Constitutional: No distress . Vital signs reviewed. HEENT: EOMI, oral membranes moist Neck: supple Cardiovascular: RRR without murmur. No JVD    Respiratory/Chest: CTA Bilaterally without wheezes or rales. Normal effort    GI/Abdomen: BS +, non-tender, non-distended Ext: no clubbing, cyanosis, or edema Psych: pleasant and cooperative Skin: Warm and dry.  Intact. Psych: appropriate Musc: Mild lower extreme edema No tenderness in extremities. Neuro:  Alert and oriented x 3. Normal insight and awareness. Intact Memory. Normal language and speech. Cranial nerve exam unremarkable  HOH. No sensory deficits Bilateral upper extremities: 5/5 proximal distal  Bilateral lower extremities: Hip flexion: 4-4+/5, distally 5/5, stable  Assessment/Plan: 1. Functional deficits which require 3+ hours per day of interdisciplinary therapy in a comprehensive inpatient rehab setting.  Physiatrist is providing  close team supervision and 24 hour management of active medical problems listed below.  Physiatrist and rehab team continue to assess barriers to discharge/monitor patient progress toward functional and medical goals  Care Tool:  Bathing    Body parts bathed by patient: Right arm,Left arm,Chest,Abdomen,Front perineal area,Right upper leg,Left upper leg,Face,Buttocks,Right lower leg,Left lower leg   Body parts bathed by helper: Right lower leg,Left lower leg     Bathing assist Assist Level: Contact Guard/Touching assist     Upper Body Dressing/Undressing Upper body dressing   What is the patient wearing?: Pull over shirt    Upper body assist Assist Level: Set up assist    Lower Body Dressing/Undressing Lower body dressing      What is the patient wearing?: Pants,Underwear/pull up     Lower body assist Assist for lower body dressing: Contact Guard/Touching assist     Toileting Toileting    Toileting assist Assist for toileting: Contact Guard/Touching assist     Transfers Chair/bed transfer  Transfers assist     Chair/bed transfer assist level: Contact Guard/Touching assist     Locomotion Ambulation   Ambulation assist      Assist level: Contact Guard/Touching assist Assistive device: Walker-rolling Max distance: 176f   Walk 10 feet activity   Assist     Assist level: Contact Guard/Touching assist Assistive device: Walker-rolling   Walk 50 feet activity   Assist Walk 50 feet with 2 turns activity did not occur: Safety/medical concerns  Assist level:  Contact Guard/Touching assist Assistive device: Walker-rolling    Walk 150 feet activity   Assist Walk 150 feet activity did not occur: Safety/medical concerns  Assist level: Contact Guard/Touching assist Assistive device: Walker-rolling    Walk 10 feet on uneven surface  activity   Assist Walk 10 feet on uneven surfaces activity did not occur: Safety/medical concerns          Wheelchair     Assist Will patient use wheelchair at discharge?: No Type of Wheelchair: Manual    Wheelchair assist level: Supervision/Verbal cueing Max wheelchair distance: 162f    Wheelchair 50 feet with 2 turns activity    Assist        Assist Level: Supervision/Verbal cueing   Wheelchair 150 feet activity     Assist      Assist Level: Supervision/Verbal cueing   Blood pressure (!) 105/48, pulse 79, temperature 98.4 F (36.9 C), resp. rate 20, height _0  (1.702 m), weight 93.2 kg, SpO2 90 %.    Medical Problem List and Plan: 1. Debility secondary to AKI superimposed on CKD stage III/hyponatremia/gout/atrial fibrillation  Continue CIR  1/4- doing ADLs min A and walking 100+ ft with RW min A per therapy- will arrange for d/c date likely later this week. 1/5- set d/c date as 1/11- suggested to family to move up Heme/Onc appointment.   1/7- Heme/ Onc saw pt, formal consult done-    1/8 episode of "lethargy" this morning. Seems back to baseline. No obvious pharm, ID, or metabolic source for whatever happened. VSS   -observe for now 2. Antithrombotics: -DVT/anticoagulation: SCDs -antiplatelet therapy: N/A 3. Pain Management: Tylenol as needed            Controlled on 1/8 4. Mood: Provide emotional support -antipsychotic agents: N/A 5. Neuropsych: This patient is capable of making decisions on her own behalf. 6. Skin/Wound Care: Routine skin checks 7. Fluids/Electrolytes/Nutrition:  8. Acute new onset atrial fibrillation: Monitor with increased exertion.  1/5- rate controlled- con't meds  1/6- rate up to 117 with therapy- will monitor and see if doesn't come down, migh tneed EKG?  1/8 HR in 70's 80's today.  9. H. pylori positive. Completed triple regimen therapy and then maintain Protonix twice daily 10. Bilateral lower extremity knee pain. Arthrocentesis of both knees. Fluid analysis no evidence of infection but urate  crystals diagnostic gouty arthritis. Continue allopurinol. No colchicine due to AKI            1/5- d/c allopurinol since can lower ANC? Tylenol helping pain.  Monitor with increased activity 11. Hypothyroidism: Synthroid 12. Decreased nutritional storage. Dietary follow-up 13. AKI superimposed on CKD stage IIIa.  Creatinine 1.03 on 12/30, labs ordered for tomorrow  1/5- Cr 0.98- con't regimen  1/6- Cr 1.05- fluctuating slightly- will monitor  1/8- Cr 0.99 14. History of breast cancer in 1999 treated with lumpectomy radiation as well as tamoxifen. Follow-up outpatient 15. History of lung cancer. Status post partial upper lobe lobectomy 2006. Follow-up outpatient 16. Anemia of chronic disease/pancytopenia. Patient has been seen by hematology services outpatient. Hemoglobin 8.0 on 12/30, 8.7 1/8 17. Low albumin: encourage intake of high protein foods.  18.  Labile blood pressure  Relatively controlled on 1/8 19.  Hyponatremia (122 on admit)  Sodium ---133 1/8    20. Hypomagnesemia  12/3- will replete Mg 800 mg BID for Mg of 1.3- K+ is OK- and recheck labs in AM  1/5- will recheck Mg level- was 1.4 yesterday.   1/7-  Mg was 1.6 yesterday -doing better 21. ANC critically low/pancytopenia.   1/3- will Consult HemeOnc- esp with her hx of Cancer x2- will check Iron, TIBC, ferritin, folate and Vit B12- and daily CBC with diff per Oncology- Dr Lorenso Courier and then if they are normal, migh tneed bone marrow biopsy.   1/4- spoke with grandson- they were planning to do bone marrow biopsy before admission "in the next few weeks"- labs checking iron, etc were OK- will likely need Bone marrow biopsy- but pt would rather do outpt- she wants to go home. D/w grandson, Rosalita Chessman- they've been thinking MDS for 2+ years, but hadn't done bone marrow biopsy yet.   1/5- arranged for bone marrow biopsy today- going now.   1/6- spoke with Heme/Onc due to pancytopenia getting  worse- they need to wait for bone marrow biopsy results to intervene- will do a form consult today  1/7- Heme/Onc made no additional recs in their consult yesterday- waiting for bone marrow biopsy- ANC up slightly to 0.3- but plts down to 67k and Hb down slightly to 8.1-   1/8 CBC stable 22. Insomnia  1/3- write for trazodone for sleep.  1/4- slept MUCH better   1/8 don't think trazodone contributed to period of somnolence this morning as she was bright and awake when I saw her earlier 38. Thrombocytopenia  1/5- down to 85k- off blood thinners.   1/6- Plts down to 73k- as per #21.   1/8- Plts 68k- will monitor- no active bleeding!  off blood thinners.     LOS: 10 days A FACE TO FACE EVALUATION WAS PERFORMED  Meredith Staggers 11/09/2020, 9:02 AM

## 2020-11-09 NOTE — Progress Notes (Signed)
Physical Therapy Session Note  Patient Details  Name: Lisa [name] MRN: [id number] Date of Birth: Dec 04, 1933  Today's Date: 11/09/2020 PT Individual Time: 0017-4944 PT Individual Time Calculation (min): 69 min   Short Term Goals: Week 2:  PT Short Term Goal 1 (Week 2): STG=LTG due to ELOS  Skilled Therapeutic Interventions/Progress Updates:    Pt had episode earlier this morning related as "lethargic" where she states she could hear them calling her but couldn't wake up (was in chair). Per RN and MD note, pt ok to perform therapy to tolerance just monitor closely. Pt did not have any further episodes during therapy session this AM.  Functional gait in room with RW to and from bathroom with min assist for sit > stands and CGA for gait and balance during dynamic standing balance for clothing management and hygiene. Cues for hand placement during sit <> stands for safety.  NMR for dynamic standing balance re-training with and without UE support attempted with alternating toe taps to 4" step x 10 reps each with min assist for balance and up to mod assist without UE support. Pt reports some discomfort in knees (ongoing) so rest breaks as needed. Progressed to compliant surface on airex pad with decreasing UE support while performing functional reaching task in various directions with CGA/min assist for balance. Repeated x 2 reps each time with CGA/min assist for sit <> stands with cues not to pull up on RW for support. Standing heel and toe raises x 10 reps each with UE support for increased strengthening to aid with overall ankle strategies during balance retraining.  Education in regards to continued HEP at home upon d/c and progress overall.  Shorter distance gait throughout session x 3 bouts with overall CGA with cues for upright posture through trunk. Returned to recliner at end of session with all needs in reach.    Therapy Documentation Precautions:  Precautions Precautions:  Fall Precaution Comments: unsteady knees Restrictions Weight Bearing Restrictions: No Pain:  Discomfort in knees - already had cream from RN earlier per report.     Therapy/Group: Individual Therapy  Canary Brim Ivory Broad, PT, DPT, CBIS  11/09/2020, 11:09 AM

## 2020-11-09 NOTE — Plan of Care (Signed)
  Problem: Consults Goal: RH GENERAL PATIENT EDUCATION Description: See Patient Education module for education specifics. Outcome: Progressing   Problem: RH BOWEL ELIMINATION Goal: RH STG MANAGE BOWEL WITH ASSISTANCE Description: STG Manage Bowel with supervision Assistance. Outcome: Progressing Goal: RH STG MANAGE BOWEL W/MEDICATION W/ASSISTANCE Description: STG Manage Bowel with Medication with supervision Assistance. Outcome: Progressing   Problem: RH BLADDER ELIMINATION Goal: RH STG MANAGE BLADDER WITH ASSISTANCE Description: STG Manage Bladder With mod I Assistance Outcome: Progressing   Problem: RH SKIN INTEGRITY Goal: RH STG MAINTAIN SKIN INTEGRITY WITH ASSISTANCE Description: STG Maintain Skin Integrity With supervision Assistance. Outcome: Progressing Goal: RH STG ABLE TO PERFORM INCISION/WOUND CARE W/ASSISTANCE Description: STG Able To Perform Incision/Wound Care With supervision Assistance. Outcome: Progressing   Problem: RH SAFETY Goal: RH STG ADHERE TO SAFETY PRECAUTIONS W/ASSISTANCE/DEVICE Description: STG Adhere to Safety Precautions With supervision Assistance/Device. Outcome: Progressing   Problem: RH PAIN MANAGEMENT Goal: RH STG PAIN MANAGED AT OR BELOW PT'S PAIN GOAL Description: <3 on a 0-10 pain scale. Outcome: Progressing   Problem: RH KNOWLEDGE DEFICIT GENERAL Goal: RH STG INCREASE KNOWLEDGE OF SELF CARE AFTER HOSPITALIZATION Description: Patient will be able to demonstrate knowledge of medication management, dietary restrictions, any weightbearing precautions, follow up recommendations, with educational materials and handouts with cues and reminders from staff. Outcome: Progressing

## 2020-11-09 NOTE — Progress Notes (Signed)
On second round patient noted slumped in chair while dietary was trying to get order. Patient awakens with touch but very difficult to stay awake, speech garbled. Vitals taken. MD Naaman Plummer notified. After several minutes patient more alert, able to have conversation. Patient moving all extremities appropriately. Patient reports "I was so sleepy". MD Naaman Plummer assessed patient. No new orders received. Patient appears back to baseline. Continue to monitor.

## 2020-11-10 ENCOUNTER — Inpatient Hospital Stay (HOSPITAL_COMMUNITY): Payer: Medicare Other

## 2020-11-10 LAB — CBC WITH DIFFERENTIAL/PLATELET
Abs Immature Granulocytes: 0.08 10*3/uL — ABNORMAL HIGH (ref 0.00–0.07)
Abs Immature Granulocytes: 0.06 10*3/uL (ref 0.00–0.07)
Basophils Absolute: 0 10*3/uL (ref 0.0–0.1)
Basophils Absolute: 0 10*3/uL (ref 0.0–0.1)
Basophils Relative: 0 %
Basophils Relative: 0 %
Eosinophils Absolute: 0 10*3/uL (ref 0.0–0.5)
Eosinophils Absolute: 0 10*3/uL (ref 0.0–0.5)
Eosinophils Relative: 0 %
Eosinophils Relative: 0 %
HCT: 28 % — ABNORMAL LOW (ref 36.0–46.0)
HCT: 29 % — ABNORMAL LOW (ref 36.0–46.0)
Hemoglobin: 8.1 g/dL — ABNORMAL LOW (ref 12.0–15.0)
Hemoglobin: 8.7 g/dL — ABNORMAL LOW (ref 12.0–15.0)
Immature Granulocytes: 3 %
Immature Granulocytes: 2 %
Lymphocytes Relative: 42 %
Lymphocytes Relative: 56 %
Lymphs Abs: 1 10*3/uL (ref 0.7–4.0)
Lymphs Abs: 1.5 10*3/uL (ref 0.7–4.0)
MCH: 29.7 pg (ref 26.0–34.0)
MCH: 30.9 pg (ref 26.0–34.0)
MCHC: 28.9 g/dL — ABNORMAL LOW (ref 30.0–36.0)
MCHC: 30 g/dL (ref 30.0–36.0)
MCV: 102.6 fL — ABNORMAL HIGH (ref 80.0–100.0)
MCV: 102.8 fL — ABNORMAL HIGH (ref 80.0–100.0)
Monocytes Absolute: 1.1 10*3/uL — ABNORMAL HIGH (ref 0.1–1.0)
Monocytes Absolute: 0.9 10*3/uL (ref 0.1–1.0)
Monocytes Relative: 45 %
Monocytes Relative: 32 %
Neutro Abs: 0.3 10*3/uL — CL (ref 1.7–7.7)
Neutro Abs: 0.3 10*3/uL — CL (ref 1.7–7.7)
Neutrophils Relative %: 10 %
Neutrophils Relative %: 10 %
Platelets: 67 10*3/uL — ABNORMAL LOW (ref 150–400)
Platelets: 68 10*3/uL — ABNORMAL LOW (ref 150–400)
RBC: 2.73 MIL/uL — ABNORMAL LOW (ref 3.87–5.11)
RBC: 2.82 MIL/uL — ABNORMAL LOW (ref 3.87–5.11)
RDW: 20.1 % — ABNORMAL HIGH (ref 11.5–15.5)
RDW: 20.3 % — ABNORMAL HIGH (ref 11.5–15.5)
WBC: 2.5 10*3/uL — ABNORMAL LOW (ref 4.0–10.5)
WBC: 2.7 10*3/uL — ABNORMAL LOW (ref 4.0–10.5)
nRBC: 2.9 % — ABNORMAL HIGH (ref 0.0–0.2)
nRBC: 2.6 % — ABNORMAL HIGH (ref 0.0–0.2)

## 2020-11-10 NOTE — Progress Notes (Signed)
Physical Therapy Session Note  Patient Details  Name: Lisa [name] MRN: [id number] Date of Birth: 10-Jun-1934  Today's Date: 11/10/2020 PT Individual Time: 0900-1000 PT Individual Time Calculation (min): 60 min   Short Term Goals: Week 2:  PT Short Term Goal 1 (Week 2): STG=LTG due to ELOS      Skilled Therapeutic Interventions/Progress Updates:  Pt resting in recliner.  She denied pain, but said her knees were "sore".  Anterior aspect of bil feet noted to be slightly edematousedematous; R with slightly pitting edema.  PT donned a TED hose on L foot, but pt unable to tolerate it; doffed. Pt stated that she needed to use toilet.  Sit> stand , slowly from recliner, with CGA to RW.  Gait in room to toilet with RW, CGA.  Toilet transfer with supervision without cues.  Pt continent of B and B.  Peri care with supervision.  Gait to sink for hand washing with supervision.  O2 sats 88%, HR 101 after sitting down in wc.  With cues for deep breathing, O2 sats increased to 94% in less than 30 seconds.  Gait training on level tile, carpet x 80' with CGA, RW.  O2 sats 85% after gait; 1 min of deep breathing needed to return to 90%.  Gait up/down 4 steps bil rails with self selected step to pattern, CGA.  O2 sats as above; deep breathng to recover, as above.  In ADL apartment, laundry mgt, standing to RW to hang up items on hangers in closet, and folding laundry (towels) while standing in front of bed, using bed as a table.  SOB after this activity.  Pt reports that she uses a lift chair at home, and sometimes becomes SOB with activity, requiring her to sit down.  Information related to [place], Therapist, sports.  Stand pivot transfer to return to recliner.  At end of session, pt reclined in recliner with feet elevated, seat pad alarm set and needs at hand..       Therapy Documentation Precautions:  Precautions Precautions: Fall Precaution Comments: unsteady knees Restrictions Weight Bearing Restrictions:  No  Pain: Pain Assessment Pain Scale: 0-10 Pain Score: 0-No pain :      Therapy/Group: Individual Therapy  Dhanya Bogle 11/10/2020, 10:23 AM

## 2020-11-10 NOTE — Discharge Summary (Signed)
Physician Discharge Summary  Patient ID: Lisa [name] MRN: [id number] DOB/AGE: Aug 01, 1934 87 y.o.  Admit date: 10/30/2020 Discharge date: 11/13/2020  Discharge Diagnoses:  Principal Problem:   Debility Active Problems:   Hyponatremia   Labile blood pressure New onset atrial fibrillation H. pylori positive Hypothyroidism AKI superimposed on CKD stage III History of lung cancer History of breast cancer 1999 ANC critically low/pancytopenia Thrombocytopenia  Discharged Condition: Stable  Significant Diagnostic Studies: DG Chest 2 View  Result Date: 10/26/2020 CLINICAL DATA:  Shortness of breath EXAM: CHEST - 2 VIEW COMPARISON:  10/26/2020, 10:59 a.m. FINDINGS: Mild cardiomegaly. Unchanged small bilateral pleural effusions. The visualized skeletal structures are unremarkable. IMPRESSION: Mild cardiomegaly with unchanged small bilateral pleural effusions. No new airspace opacity. Electronically Signed   By: Eddie Candle M.D.   On: 10/26/2020 12:23   DG Chest 2 View  Result Date: 10/21/2020 CLINICAL DATA:  Weakness EXAM: CHEST - 2 VIEW COMPARISON:  05/31/2017 FINDINGS: Cardiac shadow is stable. Aortic calcifications are noted. The lungs are well aerated bilaterally. No focal infiltrate or sizable effusion is seen. No bony abnormality is noted. IMPRESSION: No acute abnormality noted. Electronically Signed   By: Inez Catalina M.D.   On: 10/21/2020 13:59   DG Knee 1-2 Views Left  Result Date: 10/25/2020 CLINICAL DATA:  Knee pain, no known injury, initial encounter EXAM: LEFT KNEE - 2 VIEW COMPARISON:  None. FINDINGS: Tricompartmental degenerative changes are noted. Moderate joint effusion is noted similar to that seen on the right. No acute fracture or dislocation is noted. IMPRESSION: Degenerative change with joint effusion similar to that seen on the right side. Electronically Signed   By: Inez Catalina M.D.   On: 10/25/2020 13:01   DG Knee 1-2 Views Right  Result Date:  10/25/2020 CLINICAL DATA:  Bilateral knee pain EXAM: RIGHT KNEE - 2 VIEW COMPARISON:  None. FINDINGS: Tricompartmental degenerative changes are noted with lateral joint space narrowing. Moderate joint effusion is seen. No other soft tissue abnormality is seen. IMPRESSION: Degenerative change with joint effusion. Electronically Signed   By: Inez Catalina M.D.   On: 10/25/2020 13:00   DG Abd 1 View  Result Date: 10/26/2020 CLINICAL DATA:  Acute renal failure, abdominal distension EXAM: ABDOMEN - 1 VIEW COMPARISON:  CT abdomen and pelvis 05/31/2017 FINDINGS: Stool throughout colon, question containing retained contrast. Nonobstructive bowel gas pattern. No bowel dilatation or bowel wall thickening. Bones demineralized. Aneurysmal dilatation of the abdominal aorta, though unable to accurately assess size radiographically; patient has known abdominal aortic aneurysm and aortic tortuosity by prior CT. No definite urinary tract calcification. IMPRESSION: Nonobstructive bowel gas pattern. Aneurysmal dilatation of distal abdominal aorta though unable to at accurately assess size radiographically; patient has known abdominal aortic aneurysm by prior CT exam. Recommend ultrasound or CT assessment to assess interval change. Findings called to Dr. Clearence Ped on 10/26/2020 at 1905 hrs. Electronically Signed   By: Lavonia Dana M.D.   On: 10/26/2020 19:05   CT Head Wo Contrast  Result Date: 10/21/2020 CLINICAL DATA:  Altered level of consciousness EXAM: CT HEAD WITHOUT CONTRAST TECHNIQUE: Contiguous axial images were obtained from the base of the skull through the vertex without intravenous contrast. COMPARISON:  06/04/2005 FINDINGS: Brain: There are confluent hypodensities throughout the periventricular white matter, more pronounced since prior study, consistent with age-indeterminate small vessel ischemic changes. These are likely chronic. No other signs of acute infarct or hemorrhage. Lateral ventricles and remaining  midline structures are unremarkable. No acute extra-axial fluid collections. No mass effect.  Vascular: No hyperdense vessel or unexpected calcification. Skull: Normal. Negative for fracture or focal lesion. Sinuses/Orbits: Minimal mucosal thickening within the sphenoid sinus. Remaining sinuses are clear. Other: None. IMPRESSION: 1. Diffuse hypodensities throughout the periventricular white matter, likely representing chronic small vessel ischemic changes. 2. Otherwise no acute intracranial process. Electronically Signed   By: Randa Ngo M.D.   On: 10/21/2020 15:01   CT CHEST ABDOMEN PELVIS W CONTRAST  Result Date: 10/24/2020 CLINICAL DATA:  Poor appetite, personal history of breast cancer and lung cancer EXAM: CT CHEST, ABDOMEN, AND PELVIS WITH CONTRAST TECHNIQUE: Multidetector CT imaging of the chest, abdomen and pelvis was performed following the standard protocol during bolus administration of intravenous contrast. CONTRAST:  17m OMNIPAQUE IOHEXOL 300 MG/ML  SOLN COMPARISON:  06/13/2019 FINDINGS: CT CHEST FINDINGS Cardiovascular: The heart is mildly enlarged without pericardial effusion. Extensive atherosclerosis throughout the coronary vasculature. The aorta is normal in caliber. There is extensive atherosclerosis of the aortic arch, with moderate stenosis of the origin of the left subclavian artery. Mediastinum/Nodes: Stable subcentimeter left hilar lymph nodes. No pathologic adenopathy. Thyroid, trachea, and esophagus are unremarkable. Lungs/Pleura: Stable postsurgical changes from partial left upper lobe resection. Upper lobe predominant emphysema. Bibasilar scarring. No airspace disease, effusion, or pneumothorax. Musculoskeletal: No acute or destructive bony lesions. Reconstructed images demonstrate no additional findings. CT ABDOMEN PELVIS FINDINGS Hepatobiliary: No focal liver abnormality is seen. No gallstones, gallbladder wall thickening, or biliary dilatation. Pancreas: Unremarkable. No  pancreatic ductal dilatation or surrounding inflammatory changes. Spleen: Normal in size without focal abnormality. Adrenals/Urinary Tract: Adrenal glands are unremarkable. Kidneys are normal, without renal calculi, focal lesion, or hydronephrosis. Bladder is unremarkable. Stomach/Bowel: No bowel obstruction or ileus. Diverticulosis of the sigmoid colon without diverticulitis. Normal appendix right lower quadrant. No bowel wall thickening or inflammatory change. Vascular/Lymphatic: Infrarenal abdominal aortic aneurysm measures up to 4.8 cm in diameter, previously having measured 4.1 cm on 05/31/2017 exam. Significant atherosclerosis and mural thrombus throughout the aorta. No pathologic adenopathy. Reproductive: Uterus and bilateral adnexa are unremarkable. Other: No free fluid or free gas. No abdominal wall hernia. Musculoskeletal: No acute or destructive bony lesions. Reconstructed images demonstrate no additional findings. IMPRESSION: 1. Stable postsurgical changes from partial left upper lobe resection. 2. Infrarenal abdominal aortic aneurysm measures up to 4.8 cm in diameter, previously having measured 4.1 cm on 05/31/2017 exam. Recommend follow-up CT/MR every 6 months and vascular consultation. This recommendation follows ACR consensus guidelines: White Paper of the ACR Incidental Findings Committee II on Vascular Findings. J Am Coll Radiol 2013; 10:789-794. 3. Diverticulosis without diverticulitis. 4. Aortic Atherosclerosis (ICD10-I70.0) and Emphysema (ICD10-J43.9). Electronically Signed   By: MRanda NgoM.D.   On: 10/24/2020 18:00   DG CHEST PORT 1 VIEW  Result Date: 10/26/2020 CLINICAL DATA:  Shortness of breath. EXAM: PORTABLE CHEST 1 VIEW COMPARISON:  10/21/2020 FINDINGS: Lordotic technique is demonstrated. Lungs are adequately inflated with hazy density over the left base/retrocardiac region which may be due to atelectasis/effusion versus prominent overlying soft tissues. Borderline stable  cardiomegaly. Remainder of the exam is unchanged. IMPRESSION: Hazy density over the left base/retrocardiac region which may be due to atelectasis/effusion versus prominent overlying soft tissues. Consider PA and lateral chest x-ray for better evaluation of the left base. Electronically Signed   By: DMarin OlpM.D.   On: 10/26/2020 11:05   CT BONE MARROW BIOPSY  Result Date: 11/06/2020 INDICATION: 87year old female with a history of pancytopenia EXAM: CT BIOPSY BONE MARROW MEDICATIONS: None. ANESTHESIA/SEDATION: No moderate sedation was employed during  this procedure. A total of Versed 0 mg and Fentanyl 25 mcg was administered intravenously. No moderate sedation. The patient's level of consciousness and vital signs were monitored continuously by radiology nursing throughout the procedure under my direct supervision. FLUOROSCOPY TIME:  CT COMPLICATIONS: None PROCEDURE: The procedure risks, benefits, and alternatives were explained to the patient. Questions regarding the procedure were encouraged and answered. The patient understands and consents to the procedure. Scout CT of the pelvis was performed for surgical planning purposes. The posterior pelvis was prepped with Chlorhexidine in a sterile fashion, and a sterile drape was applied covering the operative field. A sterile gown and sterile gloves were used for the procedure. Local anesthesia was provided with 1% Lidocaine. Posterior right iliac bone was targeted for biopsy. The skin and subcutaneous tissues were infiltrated with 1% lidocaine without epinephrine. A small stab incision was made with an 11 blade scalpel, and an 11 gauge Murphy needle was advanced with CT guidance to the posterior cortex. Manual forced was used to advance the needle through the posterior cortex and the stylet was removed. A bone marrow aspirate was retrieved and passed to a cytotechnologist in the room. The Murphy needle was then advanced without the stylet for a core biopsy. The  core biopsy was retrieved and also passed to a cytotechnologist. Manual pressure was used for hemostasis and a sterile dressing was placed. No complications were encountered no significant blood loss was encountered. Patient tolerated the procedure well and remained hemodynamically stable throughout. IMPRESSION: Status post CT-guided bone marrow biopsy, with tissue specimen sent to pathology for complete histopathologic analysis Signed, Dulcy Fanny. Earleen Newport, DO [facility] Radiology Electronically Signed   By: Corrie Mckusick D.O.   On: 11/06/2020 12:51   ECHOCARDIOGRAM COMPLETE  Result Date: 10/22/2020    ECHOCARDIOGRAM REPORT   Patient Name:   Karigan Lemming Date of Exam: 10/22/2020 Medical Rec #:  [id number]      Height:       67.0 in Accession #:    [id number]     Weight:       191.0 lb Date of Birth:  Jun 01, 1934      BSA:          1.983 m Patient Age:    88 years       BP:           114/58 mmHg Patient Gender: F              HR:           61 bpm. Exam Location:  Inpatient Procedure: 2D Echo, Cardiac Doppler and Color Doppler Indications:    Atrial fibrillation  History:        Patient has no prior history of Echocardiogram examinations.                 Arrythmias:Atrial Fibrillation; Risk Factors:Hypertension.                 Breast and lung cancer, Renal failure.  Sonographer:    Dustin Flock Referring Phys: [place]  1. Left ventricular ejection fraction, by estimation, is 65 to 70%. The left ventricle has normal function. The left ventricle has no regional wall motion abnormalities. There is mild left ventricular hypertrophy. Left ventricular diastolic parameters are consistent with Grade I diastolic dysfunction (impaired relaxation).  2. Prominent moderator band. Right ventricular systolic function is hyperdynamic. The right ventricular size is normal. There is moderately elevated pulmonary artery systolic  pressure.  3. Left atrial  size was moderately dilated.  4. The mitral valve is grossly normal. Trivial mitral valve regurgitation.  5. The aortic valve is tricuspid. Aortic valve regurgitation is not visualized. Mild aortic valve sclerosis is present, with no evidence of aortic valve stenosis.  6. The inferior vena cava is normal in size with greater than 50% respiratory variability, suggesting right atrial pressure of 3 mmHg. FINDINGS  Left Ventricle: Left ventricular ejection fraction, by estimation, is 65 to 70%. The left ventricle has normal function. The left ventricle has no regional wall motion abnormalities. The left ventricular internal cavity size was normal in size. There is  mild left ventricular hypertrophy. Left ventricular diastolic parameters are consistent with Grade I diastolic dysfunction (impaired relaxation). Indeterminate filling pressures. Right Ventricle: Prominent moderator band. The right ventricular size is normal. No increase in right ventricular wall thickness. Right ventricular systolic function is hyperdynamic. There is moderately elevated pulmonary artery systolic pressure. The tricuspid regurgitant velocity is 3.44 m/s, and with an assumed right atrial pressure of 3 mmHg, the estimated right ventricular systolic pressure is 01.7 mmHg. Left Atrium: Left atrial size was moderately dilated. Right Atrium: Right atrial size was normal in size. Pericardium: There is no evidence of pericardial effusion. Mitral Valve: The mitral valve is grossly normal. Trivial mitral valve regurgitation. Tricuspid Valve: The tricuspid valve is grossly normal. Tricuspid valve regurgitation is trivial. Aortic Valve: The aortic valve is tricuspid. Aortic valve regurgitation is not visualized. Mild aortic valve sclerosis is present, with no evidence of aortic valve stenosis. Aortic valve peak gradient measures 15.1 mmHg. Pulmonic Valve: The pulmonic valve was grossly normal. Pulmonic valve regurgitation is trivial. Aorta: The aortic  root and ascending aorta are structurally normal, with no evidence of dilitation. Venous: The inferior vena cava is normal in size with greater than 50% respiratory variability, suggesting right atrial pressure of 3 mmHg. IAS/Shunts: No atrial level shunt detected by color flow Doppler.  LEFT VENTRICLE PLAX 2D LVIDd:         3.40 cm  Diastology LVIDs:         1.80 cm  LV e' medial:    5.66 cm/s LV PW:         1.10 cm  LV E/e' medial:  13.9 LV IVS:        1.20 cm  LV e' lateral:   8.27 cm/s LVOT diam:     2.10 cm  LV E/e' lateral: 9.5 LV SV:         64 LV SV Index:   32 LVOT Area:     3.46 cm  RIGHT VENTRICLE RV Basal diam:  3.00 cm RV S prime:     13.70 cm/s TAPSE (M-mode): 2.4 cm LEFT ATRIUM             Index       RIGHT ATRIUM           Index LA diam:        3.30 cm 1.66 cm/m  RA Area:     19.70 cm LA Vol (A2C):   92.9 ml 46.85 ml/m RA Volume:   57.50 ml  29.00 ml/m LA Vol (A4C):   81.6 ml 41.15 ml/m LA Biplane Vol: 89.9 ml 45.33 ml/m  AORTIC VALVE AV Area (Vmax): 1.56 cm AV Vmax:        194.00 cm/s AV Peak Grad:   15.1 mmHg LVOT Vmax:      87.60 cm/s LVOT Vmean:  57.100 cm/s LVOT VTI:       0.186 m  AORTA Ao Root diam: 2.60 cm MITRAL VALVE               TRICUSPID VALVE MV Area (PHT): 3.37 cm    TR Peak grad:   47.3 mmHg MV Decel Time: 225 msec    TR Vmax:        344.00 cm/s MV E velocity: 78.50 cm/s MV A velocity: 52.80 cm/s  SHUNTS MV E/A ratio:  1.49        Systemic VTI:  0.19 m                            Systemic Diam: 2.10 cm Lyman Bishop MD Electronically signed by Lyman Bishop MD Signature Date/Time: 10/22/2020/4:36:05 PM    Final    VAS [place] LOWER EXTREMITY VENOUS (DVT)  Result Date: 10/23/2020  Lower Venous DVT Study Other Indications: BLE calf tenderness. Risk Factors: Hx of Cancer. Performing Technologist: Rogelia Rohrer  Examination Guidelines: A complete evaluation includes B-mode imaging, spectral Doppler, color Doppler, and power Doppler as needed of all accessible portions of each  vessel. Bilateral testing is considered an integral part of a complete examination. Limited examinations for reoccurring indications may be performed as noted. The reflux portion of the exam is performed with the patient in reverse Trendelenburg.  +---------+---------------+---------+-----------+----------+--------------+ RIGHT    CompressibilityPhasicitySpontaneityPropertiesThrombus Aging +---------+---------------+---------+-----------+----------+--------------+ CFV      Full           Yes      Yes                                 +---------+---------------+---------+-----------+----------+--------------+ SFJ      Full                                                        +---------+---------------+---------+-----------+----------+--------------+ FV Prox  Full           Yes      Yes                                 +---------+---------------+---------+-----------+----------+--------------+ FV Mid   Full           Yes      Yes                                 +---------+---------------+---------+-----------+----------+--------------+ FV DistalFull           Yes      Yes                                 +---------+---------------+---------+-----------+----------+--------------+ PFV      Full                                                        +---------+---------------+---------+-----------+----------+--------------+ POP  Full           Yes      Yes                                 +---------+---------------+---------+-----------+----------+--------------+ PTV      Full                                                        +---------+---------------+---------+-----------+----------+--------------+ PERO     Full                                                        +---------+---------------+---------+-----------+----------+--------------+   +---------+---------------+---------+-----------+----------+--------------+ LEFT      CompressibilityPhasicitySpontaneityPropertiesThrombus Aging +---------+---------------+---------+-----------+----------+--------------+ CFV      Full           Yes      Yes                                 +---------+---------------+---------+-----------+----------+--------------+ SFJ      Full                                                        +---------+---------------+---------+-----------+----------+--------------+ FV Prox  Full           Yes      Yes                                 +---------+---------------+---------+-----------+----------+--------------+ FV Mid   Full           Yes      Yes                                 +---------+---------------+---------+-----------+----------+--------------+ FV DistalFull           Yes      Yes                                 +---------+---------------+---------+-----------+----------+--------------+ PFV      Full                                                        +---------+---------------+---------+-----------+----------+--------------+ POP      Full           Yes      Yes                                 +---------+---------------+---------+-----------+----------+--------------+ PTV      Full                                                        +---------+---------------+---------+-----------+----------+--------------+  PERO     Full                                                        +---------+---------------+---------+-----------+----------+--------------+     Summary: RIGHT: - There is no evidence of deep vein thrombosis in the lower extremity.  - A cystic structure is found in the popliteal fossa.   *See table(s) above for measurements and observations. Electronically signed by Curt Jews MD on 10/23/2020 at 8:02:53 PM.    Final     Labs:  Basic Metabolic Panel: Recent Labs  Lab 11/05/20 0514 11/06/20 0555 11/07/20 0456 11/08/20 0502 11/09/20 0503  NA 135 135 135 136 133*   K 4.0 4.3 4.6 4.3 4.6  CL 98 99 99 101 99  CO2 '29 27 27 ' 34* 25  GLUCOSE 84 95 97 91 91  BUN '12 10 9 8 ' 6*  CREATININE 1.04* 0.98 1.05* 0.93 0.99  CALCIUM 9.1 9.0 8.6* 8.6* 8.6*  MG 1.4*  --  1.6*  --   --   PHOS  --  3.7  --   --   --     CBC: Recent Labs  Lab 11/07/20 0456 11/08/20 0502 11/09/20 0503  WBC 2.7* 2.5* 2.7*  NEUTROABS 0.2* 0.3* 0.3*  HGB 8.2* 8.1* 8.7*  HCT 27.9* 28.0* 29.0*  MCV 101.5* 102.6* 102.8*  PLT 73* 67* 68*    CBG: No results for input(s): GLUCAP in the last 168 hours.  Family history.  Mother with unknown cancer.  Denies any esophageal cancer colon cancer or rectal cancer  Brief HPI:   Lisa [name] is a 87 y.o. right-handed female with history of breast cancer in 1999 treated with lumpectomy and radiation as well as tamoxifen for 5 years, lung cancer with partial upper lobe lobectomy 2006, AKI thought secondary to ARB seen by nephrology services in the past, pancytopenia followed by hematology services in the past hypothyroidism quit smoking 29 years ago and history of gout.  Patient lives with 52 year old daughter multilevel home reportedly independent until 3 weeks ago with progressive decrease in mobility.  She presented on 10/21/2020 with generalized weakness nausea weight loss decreased oral intake x3 weeks.  Admission chemistry sodium 122 glucose 121 BUN 30 creatinine 1.53 hemoglobin 10.9 urinalysis negative nitrite urine culture no growth uric acid 9.5 troponin 35-41 TSH 14.355 osmolality 276.  She was noted to have new onset atrial fibrillation cardiology consulted.  Echocardiogram with ejection fraction of 65 to 70% no wall motion abnormalities grade 1 diastolic dysfunction.  She was placed on beta-blocker and monitored.  Lower extremity Dopplers negative for DVT.  Gastroenterology services consulted for weight loss poor appetite underwent upper GI endoscopy 10/23/2020 per Dr. Havery Moros.  No focal ulcerations noted.  There was a 3 cm hiatal hernia  present.  The esophagus was tortuous.  Biopsies taken with cold forceps in the gastric body.  H. pylori positive started on triple therapy regimen x2 weeks then Protonix twice daily.  Orthopedic services consulted 10/26/2020 for bilateral knee effusions x-rays showed arthritic changes.  She did undergo joint aspiration of both knees.  Fluid analysis showed no evidence of infection but urate crystals diagnostic data arthritis.  Placed on allopurinol but on hold colchicine due to history of AKI.  Renal function stabilized latest creatinine 1.34.  Due to patient's  decrease in functional mobility need for assist with ADLs she was admitted for a comprehensive rehab program.   Hospital Course: Lisa [name] was admitted to rehab 10/30/2020 for inpatient therapies to consist of PT, ST and OT at least three hours five days a week. Past admission physiatrist, therapy team and rehab RN have worked together to provide customized collaborative inpatient rehab.  Pertaining to patient's debility secondary to AKI superimposed CKD hyponatremia gout atrial fibrillation she continued to participate with therapies.  SCDs for DVT prophylaxis.  In regards to her atrial fibrillation cardiac rate controlled she would follow-up outpatient cardiology services and remained on Lopressor.  She had completed a course of triple regimen therapy for H. pylori follow-up gastrology services.  Bilateral lower extremity knee pain arthrocentesis of both knees fluid analysis no evidence of infection but urate crystals diagnostic gouty arthritis maintained on allopurinol no colchicine due to AKI her allopurinol was later discontinued since it can lower her ANC that was positive hematology consulted Dr. Lorenso Courier to follow-up as outpatient.  Issues were discussed for bone marrow biopsy with results pending and based on those findings of AML or MDS-if MDS oncology appointment follow-up of AML will send her urgently referral to [facility]..  Anemia of  chronic disease with latest hemoglobin 8.2, WBC 2.5, platelets 70,000   Blood pressures were monitored on TID basis and controlled     Rehab course: During patient's stay in rehab weekly team conferences were held to monitor patient's progress, set goals and discuss barriers to discharge. At admission, patient required minimal assist ambulate 18 feet rolling walker moderate assist sit to stand minimal guard supine to sit.  Moderate assist upper body bathing total assist lower body bathing mod assist upper body dressing moderate assist lower body dressing  Physical exam.  Blood pressure 129/74 pulse 80 temperature 95 respirations 18 oxygen saturations 94% room air Constitutional.  No acute distress HEENT Head.  Normocephalic and atraumatic Eyes.  Pupils round and reactive to light no discharge without nystagmus Neck.  Supple nontender no JVD without thyromegaly Cardiac regular rate rhythm mild extra sounds or murmur heard Abdomen.  Soft nontender positive bowel sounds without rebound Respiratory effort normal no respiratory distress without wheeze Musculoskeletal normal range of motion Neurologic.  Alert oriented follows commands she is a bit hard of hearing. Bilateral upper extremities 5/5 proximal distal bilateral lower extremities.  Hip flexion 4 - 4+/5 distally is 5/5  He/She  has had improvement in activity tolerance, balance, postural control as well as ability to compensate for deficits. He/She has had improvement in functional use RUE/LUE  and RLE/LLE as well as improvement in awareness.  Sit to stand slowly from recliner with contact-guard assist to rolling walker gait in room to toilet with rolling walker contact-guard assist toilet transfers supervision without cues.  Pericare with supervision.  She can increase her ambulation up to 80 feet on level tile and carpet.  In the ADL apartment laundry management standing to rolling walker to hang up items on hangers and closet folding  laundry while standing in front of bed using bed as a table.  Full family teaching completed plan discharge to home       Disposition: Discharge to home    Diet: Regular  Special Instructions: No driving smoking or alcohol  Medications at discharge 1.  Ventolin inhaler 2 puffs every 6 hours as needed wheezing 2.  Voltaren gel 4 g 4 times daily 3.  Breo Ellipta 1 puff daily 4.  Lasix  10 mg p.o. daily 5.  Synthroid 100 mcg p.o. daily 6.  Magnesium oxide 800 mg p.o. twice daily 7.  Lopressor 12.5 mg p.o. twice daily 8.  Multivitamin daily 9.  Protonix 40 mg p.o. daily 10.  Senokot S1 tablet nightly 11.  Tramadol 50 mg every 6 hours as needed pain  30-35 minutes were spent completing discharge summary and discharge planning     Follow-up Information    Lovorn, Jinny Blossom, MD Follow up.   Specialty: Physical Medicine and Rehabilitation Why: Only as needed Contact information: [address] [place] [address] [phone]        Yetta Flock, MD Follow up.   Specialty: Gastroenterology Why: Call for appointment as needed Contact information: [place] [address] [phone]        Celene Squibb, MD Follow up.   Specialty: Internal Medicine Contact information: [place] [address] [place], Cassie Freer, MD Follow up.   Specialties: Internal Medicine, Cardiology, Radiology Why: call for appointment Contact information: [place] [address] [phone]        Orson Slick, MD Follow up.   Specialty: Hematology and Oncology Why: call for appointment Contact information: [address] [address] [phone]        Derek Jack, MD Follow up.   Specialty: Hematology Why: Call to confirm appointment 11/19/2020 Contact information: [place] [address] [phone]                Signed: Lavon Paganini [place] 11/11/2020, 6:45 AM

## 2020-11-10 NOTE — Progress Notes (Signed)
[facility] PHYSICAL MEDICINE & REHABILITATION PROGRESS NOTE   Subjective/Complaints:  Pt feeling well this morning. No more spells such as she experienced yesterday morning where she was difficult to arouse.   ROS: Patient denies fever, rash, sore throat, blurred vision, nausea, vomiting, diarrhea, cough, shortness of breath or chest pain, joint or back pain, headache, or mood change.    Objective:   No results found. Recent Labs    11/08/20 0502 11/09/20 0503  WBC 2.5* 2.7*  HGB 8.1* 8.7*  HCT 28.0* 29.0*  PLT 67* 68*   Recent Labs    11/08/20 0502 11/09/20 0503  NA 136 133*  K 4.3 4.6  CL 101 99  CO2 34* 25  GLUCOSE 91 91  BUN 8 6*  CREATININE 0.93 0.99  CALCIUM 8.6* 8.6*   No intake or output data in the 24 hours ending 11/10/20 0857      Physical Exam: Vital Signs Blood pressure (!) 107/52, pulse 88, temperature (!) 97.5 F (36.4 C), resp. rate 18, height '5\' 7"'  (1.702 m), weight 93.2 kg, SpO2 94 %. Constitutional: No distress . Vital signs reviewed. HEENT: EOMI, oral membranes moist Neck: supple Cardiovascular: RRR without murmur. No JVD    Respiratory/Chest: CTA Bilaterally without wheezes or rales. Normal effort    GI/Abdomen: BS +, non-tender, non-distended Ext: no clubbing, cyanosis, or edema Psych: pleasant and cooperative Skin: Warm and dry.  Intact. Psych: appropriate Musc: Mild lower extreme edema ongoing No tenderness in extremities. Neuro:  Alert and oriented x 3. Normal insight and awareness. Intact Memory. Normal language and speech. Cranial nerve exam unremarkable  Sl HOH. No sensory deficits Bilateral upper extremities: 5/5 proximal distal  Bilateral lower extremities: Hip flexion: 4-4+/5, distally 5/5, stable  Assessment/Plan: 1. Functional deficits which require 3+ hours per day of interdisciplinary therapy in a comprehensive inpatient rehab setting.  Physiatrist is providing close team supervision and 24 hour management of active  medical problems listed below.  Physiatrist and rehab team continue to assess barriers to discharge/monitor patient progress toward functional and medical goals  Care Tool:  Bathing    Body parts bathed by patient: Right arm,Left arm,Chest,Abdomen,Front perineal area,Right upper leg,Left upper leg,Face,Buttocks,Right lower leg,Left lower leg   Body parts bathed by helper: Right lower leg,Left lower leg     Bathing assist Assist Level: Contact Guard/Touching assist     Upper Body Dressing/Undressing Upper body dressing   What is the patient wearing?: Pull over shirt    Upper body assist Assist Level: Set up assist    Lower Body Dressing/Undressing Lower body dressing      What is the patient wearing?: Pants,Underwear/pull up     Lower body assist Assist for lower body dressing: Contact Guard/Touching assist     Toileting Toileting    Toileting assist Assist for toileting: Contact Guard/Touching assist     Transfers Chair/bed transfer  Transfers assist     Chair/bed transfer assist level: Contact Guard/Touching assist     Locomotion Ambulation   Ambulation assist      Assist level: Contact Guard/Touching assist Assistive device: Walker-rolling Max distance: 20'   Walk 10 feet activity   Assist     Assist level: Contact Guard/Touching assist Assistive device: Walker-rolling   Walk 50 feet activity   Assist Walk 50 feet with 2 turns activity did not occur: Safety/medical concerns  Assist level: Contact Guard/Touching assist Assistive device: Walker-rolling    Walk 150 feet activity   Assist Walk 150 feet activity did  not occur: Safety/medical concerns  Assist level: Contact Guard/Touching assist Assistive device: Walker-rolling    Walk 10 feet on uneven surface  activity   Assist Walk 10 feet on uneven surfaces activity did not occur: Safety/medical concerns         Wheelchair     Assist Will patient use wheelchair at  discharge?: No Type of Wheelchair: Manual    Wheelchair assist level: Supervision/Verbal cueing Max wheelchair distance: 162f    Wheelchair 50 feet with 2 turns activity    Assist        Assist Level: Supervision/Verbal cueing   Wheelchair 150 feet activity     Assist      Assist Level: Supervision/Verbal cueing   Blood pressure (!) 107/52, pulse 88, temperature (!) 97.5 F (36.4 C), resp. rate 18, height '5\' 7"'  (1.702 m), weight 93.2 kg, SpO2 94 %.    Medical Problem List and Plan: 1. Debility secondary to AKI superimposed on CKD stage III/hyponatremia/gout/atrial fibrillation  Continue CIR  1/4- doing ADLs min A and walking 100+ ft with RW min A per therapy- will arrange for d/c date likely later this week. 1/5- set d/c date as 1/11- suggested to family to move up Heme/Onc appointment.   1/7- Heme/ Onc saw pt, formal consult done-    1/8 episode of "lethargy" this morning. Seems back to baseline. No obvious pharm, ID, or metabolic source for whatever happened. VSS  1/9 no further issues yesterday. Looks great this morning! 2. Antithrombotics: -DVT/anticoagulation: SCDs -antiplatelet therapy: N/A 3. Pain Management: Tylenol as needed            Controlled on 1/9 4. Mood: Provide emotional support -antipsychotic agents: N/A 5. Neuropsych: This patient is capable of making decisions on her own behalf. 6. Skin/Wound Care: Routine skin checks 7. Fluids/Electrolytes/Nutrition:  8. Acute new onset atrial fibrillation: Monitor with increased exertion.  1/5- rate controlled- con't meds  1/6- rate up to 117 with therapy- will monitor and see if doesn't come down, migh tneed EKG?  1/9 HR in   80's today.  9. H. pylori positive. Completed triple regimen therapy and then maintain Protonix twice daily 10. Bilateral lower extremity knee pain. Arthrocentesis of both knees. Fluid analysis no evidence of infection but urate crystals diagnostic  gouty arthritis. Continue allopurinol. No colchicine due to AKI            1/5- d/c allopurinol since can lower ANC? Tylenol helping pain.  Monitor with increased activity 11. Hypothyroidism: Synthroid 12. Decreased nutritional storage. Dietary follow-up 13. AKI superimposed on CKD stage IIIa.  Creatinine 1.03 on 12/30, labs ordered for tomorrow  1/5- Cr 0.98- con't regimen  1/6- Cr 1.05- fluctuating slightly- will monitor  1/8- Cr 0.99 14. History of breast cancer in 1999 treated with lumpectomy radiation as well as tamoxifen. Follow-up outpatient 15. History of lung cancer. Status post partial upper lobe lobectomy 2006. Follow-up outpatient 16. Anemia of chronic disease/pancytopenia. Patient has been seen by hematology services outpatient. Hemoglobin 8.0 on 12/30, 8.7 1/8 17. Low albumin: encourage intake of high protein foods.  18.  Labile blood pressure  Relatively controlled on 1/8 19.  Hyponatremia (122 on admit)  Sodium ---133 1/8    20. Hypomagnesemia  12/3- will replete Mg 800 mg BID for Mg of 1.3- K+ is OK- and recheck labs in AM  1/5- will recheck Mg level- was 1.4 yesterday.   1/7- Mg was 1.6 yesterday -doing better 21. ANC critically low/pancytopenia.   1/3- will  Consult HemeOnc- esp with her hx of Cancer x2- will check Iron, TIBC, ferritin, folate and Vit B12- and daily CBC with diff per Oncology- Dr Lorenso Courier and then if they are normal, migh tneed bone marrow biopsy.   1/4- spoke with grandson- they were planning to do bone marrow biopsy before admission "in the next few weeks"- labs checking iron, etc were OK- will likely need Bone marrow biopsy- but pt would rather do outpt- she wants to go home. D/w grandson, Rosalita Chessman- they've been thinking MDS for 2+ years, but hadn't done bone marrow biopsy yet.   1/5- arranged for bone marrow biopsy today- going now.   1/6- spoke with Heme/Onc due to pancytopenia getting worse- they need to  wait for bone marrow biopsy results to intervene- will do a form consult today  1/7- Heme/Onc made no additional recs in their consult yesterday- waiting for bone marrow biopsy- ANC up slightly to 0.3- but plts down to 67k and Hb down slightly to 8.1-   1/8 CBC stable 22. Insomnia  1/3- write for trazodone for sleep.  1/4- slept MUCH better   1/9 don't think trazodone contributed to period of somnolence 1/8. slept well lastnight 23. Thrombocytopenia  1/5- down to 85k- off blood thinners.   1/6- Plts down to 73k- as per #21.   1/8- Plts steady 68k- will monitor- no active bleeding  off blood thinners.     LOS: 11 days A FACE TO FACE EVALUATION WAS PERFORMED  Meredith Staggers 11/10/2020, 8:57 AM

## 2020-11-11 ENCOUNTER — Inpatient Hospital Stay (HOSPITAL_COMMUNITY): Payer: Medicare Other | Admitting: Occupational Therapy

## 2020-11-11 ENCOUNTER — Inpatient Hospital Stay (HOSPITAL_COMMUNITY): Admission: RE | Admit: 2020-11-11 | Payer: Medicare Other | Source: Ambulatory Visit

## 2020-11-11 ENCOUNTER — Inpatient Hospital Stay (HOSPITAL_COMMUNITY): Payer: Medicare Other

## 2020-11-11 ENCOUNTER — Inpatient Hospital Stay (HOSPITAL_COMMUNITY): Payer: Medicare Other | Attending: Hematology

## 2020-11-11 DIAGNOSIS — D469 Myelodysplastic syndrome, unspecified: Secondary | ICD-10-CM | POA: Insufficient documentation

## 2020-11-11 DIAGNOSIS — Z87891 Personal history of nicotine dependence: Secondary | ICD-10-CM | POA: Insufficient documentation

## 2020-11-11 DIAGNOSIS — C3412 Malignant neoplasm of upper lobe, left bronchus or lung: Secondary | ICD-10-CM | POA: Insufficient documentation

## 2020-11-11 DIAGNOSIS — I513 Intracardiac thrombosis, not elsewhere classified: Secondary | ICD-10-CM | POA: Insufficient documentation

## 2020-11-11 DIAGNOSIS — Z923 Personal history of irradiation: Secondary | ICD-10-CM | POA: Insufficient documentation

## 2020-11-11 DIAGNOSIS — Z79899 Other long term (current) drug therapy: Secondary | ICD-10-CM | POA: Insufficient documentation

## 2020-11-11 DIAGNOSIS — K5900 Constipation, unspecified: Secondary | ICD-10-CM | POA: Insufficient documentation

## 2020-11-11 DIAGNOSIS — D61818 Other pancytopenia: Secondary | ICD-10-CM | POA: Insufficient documentation

## 2020-11-11 DIAGNOSIS — J439 Emphysema, unspecified: Secondary | ICD-10-CM | POA: Insufficient documentation

## 2020-11-11 DIAGNOSIS — Z853 Personal history of malignant neoplasm of breast: Secondary | ICD-10-CM | POA: Insufficient documentation

## 2020-11-11 DIAGNOSIS — I700 Atherosclerosis of aorta: Secondary | ICD-10-CM | POA: Insufficient documentation

## 2020-11-11 DIAGNOSIS — Z809 Family history of malignant neoplasm, unspecified: Secondary | ICD-10-CM | POA: Insufficient documentation

## 2020-11-11 DIAGNOSIS — R5383 Other fatigue: Secondary | ICD-10-CM | POA: Insufficient documentation

## 2020-11-11 DIAGNOSIS — Z888 Allergy status to other drugs, medicaments and biological substances status: Secondary | ICD-10-CM | POA: Insufficient documentation

## 2020-11-11 DIAGNOSIS — E871 Hypo-osmolality and hyponatremia: Secondary | ICD-10-CM | POA: Insufficient documentation

## 2020-11-11 DIAGNOSIS — G629 Polyneuropathy, unspecified: Secondary | ICD-10-CM | POA: Insufficient documentation

## 2020-11-11 DIAGNOSIS — Z8719 Personal history of other diseases of the digestive system: Secondary | ICD-10-CM | POA: Insufficient documentation

## 2020-11-11 LAB — CBC WITH DIFFERENTIAL/PLATELET
Abs Immature Granulocytes: 0.1 10*3/uL — ABNORMAL HIGH (ref 0.00–0.07)
Basophils Absolute: 0 10*3/uL (ref 0.0–0.1)
Basophils Relative: 0 %
Eosinophils Absolute: 0 10*3/uL (ref 0.0–0.5)
Eosinophils Relative: 0 %
HCT: 28.7 % — ABNORMAL LOW (ref 36.0–46.0)
Hemoglobin: 8.2 g/dL — ABNORMAL LOW (ref 12.0–15.0)
Immature Granulocytes: 4 %
Lymphocytes Relative: 49 %
Lymphs Abs: 1.3 10*3/uL (ref 0.7–4.0)
MCH: 29.7 pg (ref 26.0–34.0)
MCHC: 28.6 g/dL — ABNORMAL LOW (ref 30.0–36.0)
MCV: 104 fL — ABNORMAL HIGH (ref 80.0–100.0)
Monocytes Absolute: 0.7 10*3/uL (ref 0.1–1.0)
Monocytes Relative: 27 %
Neutro Abs: 0.5 10*3/uL — ABNORMAL LOW (ref 1.7–7.7)
Neutrophils Relative %: 20 %
Platelets: 70 10*3/uL — ABNORMAL LOW (ref 150–400)
RBC: 2.76 MIL/uL — ABNORMAL LOW (ref 3.87–5.11)
RDW: 21.2 % — ABNORMAL HIGH (ref 11.5–15.5)
WBC: 2.5 10*3/uL — ABNORMAL LOW (ref 4.0–10.5)
nRBC: 5.1 % — ABNORMAL HIGH (ref 0.0–0.2)

## 2020-11-11 LAB — COMPREHENSIVE METABOLIC PANEL
ALT: 15 U/L (ref 0–44)
AST: 21 U/L (ref 15–41)
Albumin: 2.7 g/dL — ABNORMAL LOW (ref 3.5–5.0)
Alkaline Phosphatase: 45 U/L (ref 38–126)
Anion gap: 8 (ref 5–15)
BUN: 7 mg/dL — ABNORMAL LOW (ref 8–23)
CO2: 28 mmol/L (ref 22–32)
Calcium: 9.1 mg/dL (ref 8.9–10.3)
Chloride: 98 mmol/L (ref 98–111)
Creatinine, Ser: 0.83 mg/dL (ref 0.44–1.00)
GFR, Estimated: >60 mL/min (ref >60)
Glucose, Bld: 91 mg/dL (ref 70–99)
Potassium: 4.7 mmol/L (ref 3.5–5.1)
Sodium: 134 mmol/L — ABNORMAL LOW (ref 135–145)
Total Bilirubin: 0.7 mg/dL (ref 0.3–1.2)
Total Protein: 7.2 g/dL (ref 6.5–8.1)

## 2020-11-11 LAB — MAGNESIUM: Magnesium: 1.9 mg/dL (ref 1.7–2.4)

## 2020-11-11 LAB — COPPER, SERUM: Copper: 107 ug/dL (ref 80–158)

## 2020-11-11 MED ORDER — TRAMADOL HCL 50 MG PO TABS
50.0000 mg | ORAL_TABLET | Freq: Four times a day (QID) | ORAL | 0 refills | Status: DC | PRN
Start: 2020-11-11 — End: 2021-08-28

## 2020-11-11 MED ORDER — ALBUTEROL 90 MCG/ACT IN AERS: 2.0000 | INHALATION_SPRAY | Freq: Four times a day (QID) | RESPIRATORY_TRACT | 12 refills | Status: DC | PRN

## 2020-11-11 MED ORDER — LEVOTHYROXINE SODIUM 100 MCG PO TABS: 100.0000 ug | ORAL_TABLET | Freq: Every day | ORAL | 0 refills | Status: DC

## 2020-11-11 MED ORDER — UMECLIDINIUM BROMIDE 62.5 MCG/INH IN AEPB: 1.0000 | INHALATION_SPRAY | Freq: Every day | RESPIRATORY_TRACT | 1 refills | Status: DC

## 2020-11-11 MED ORDER — FLUTICASONE FUROATE-VILANTEROL 100-25 MCG/INH IN AEPB: 1.0000 | INHALATION_SPRAY | Freq: Every day | RESPIRATORY_TRACT | 1 refills | Status: DC

## 2020-11-11 MED ORDER — SENNOSIDES-DOCUSATE SODIUM 8.6-50 MG PO TABS: 1.0000 | ORAL_TABLET | Freq: Every day | ORAL | Status: DC

## 2020-11-11 MED ORDER — ACETAMINOPHEN 325 MG PO TABS: 650.0000 mg | ORAL_TABLET | Freq: Four times a day (QID) | ORAL | Status: DC

## 2020-11-11 MED ORDER — MAGNESIUM OXIDE 400 (241.3 MG) MG PO TABS: 800.0000 mg | ORAL_TABLET | Freq: Two times a day (BID) | ORAL | 0 refills | Status: DC

## 2020-11-11 MED ORDER — METOPROLOL TARTRATE 25 MG PO TABS: 12.5000 mg | ORAL_TABLET | Freq: Two times a day (BID) | ORAL | 0 refills | Status: DC

## 2020-11-11 MED ORDER — FUROSEMIDE 20 MG PO TABS: 10.0000 mg | ORAL_TABLET | Freq: Every day | ORAL | 0 refills | Status: DC

## 2020-11-11 MED ORDER — TRAZODONE HCL 50 MG PO TABS: 50.0000 mg | ORAL_TABLET | Freq: Every day | ORAL | 0 refills | Status: DC

## 2020-11-11 MED ORDER — DICLOFENAC SODIUM 1 % EX GEL: 4.0000 g | Freq: Four times a day (QID) | CUTANEOUS | 0 refills | Status: DC

## 2020-11-11 MED ORDER — FERROUS SULFATE 325 (65 FE) MG PO TABS: 325.0000 mg | ORAL_TABLET | Freq: Every day | ORAL | 3 refills | Status: DC

## 2020-11-11 MED ORDER — PANTOPRAZOLE SODIUM 40 MG PO TBEC: 40.0000 mg | DELAYED_RELEASE_TABLET | Freq: Every day | ORAL | 0 refills | Status: DC

## 2020-11-11 NOTE — Progress Notes (Signed)
[facility] PHYSICAL MEDICINE & REHABILITATION PROGRESS NOTE   Subjective/Complaints:  Pt reports feels good- wants to go home tomorrow- d/c is planned for tomorrow.   ANC up to 0.5 today from 0.3-  Wants to get IV removed.  Going to bathroom to have BM and void.     ROS: Limited due to cognitive/behavioral  Still today   Objective:   No results found. Recent Labs    11/09/20 0503 11/11/20 0552  WBC 2.7* 2.5*  HGB 8.7* 8.2*  HCT 29.0* 28.7*  PLT 68* 70*   Recent Labs    11/09/20 0503 11/11/20 0552  NA 133* 134*  K 4.6 4.7  CL 99 98  CO2 25 28  GLUCOSE 91 91  BUN 6* 7*  CREATININE 0.99 0.83  CALCIUM 8.6* 9.1    Intake/Output Summary (Last 24 hours) at 11/11/2020 1123 Last data filed at 11/11/2020 0900 Gross per 24 hour  Intake 660 ml  Output --  Net 660 ml        Physical Exam: Vital Signs Blood pressure (!) 153/64, pulse (!) 106, temperature 98.2 F (36.8 C), temperature source Oral, resp. rate 16, height _0  (1.702 m), weight 93.2 kg, SpO2 94 %. Constitutional: No distress . Vital signs reviewed- standing and walking to bathroom to have BM with OT and RW, NAD. HEENT: EOMI, oral membranes moist Neck: supple Cardiovascular: RRR Respiratory/Chest: CTA B/L- no W/R/R- good air movement GI/Abdomen: Soft, NT, ND, (+)BS  Ext: no clubbing, cyanosis, or edema Skin: Warm and dry.  Intact. Psych: appropriate- asking for IV removal Musc: Mild lower extreme edema No tenderness in extremities. Neuro:  Alert and oriented x 3. Normal insight and awareness. Intact Memory. Normal language and speech. Cranial nerve exam unremarkable  HOH. No sensory deficits Bilateral upper extremities: 5/5 proximal distal  Bilateral lower extremities: Hip flexion: 4-4+/5, distally 5/5, stable  Assessment/Plan: 1. Functional deficits which require 3+ hours per day of interdisciplinary therapy in a comprehensive inpatient rehab setting.  Physiatrist is providing close team  supervision and 24 hour management of active medical problems listed below.  Physiatrist and rehab team continue to assess barriers to discharge/monitor patient progress toward functional and medical goals  Care Tool:  Bathing    Body parts bathed by patient: Right arm,Left arm,Chest,Abdomen,Front perineal area,Right upper leg,Left upper leg,Face,Buttocks,Right lower leg,Left lower leg   Body parts bathed by helper: Right lower leg,Left lower leg     Bathing assist Assist Level: Contact Guard/Touching assist     Upper Body Dressing/Undressing Upper body dressing   What is the patient wearing?: Pull over shirt    Upper body assist Assist Level: Set up assist    Lower Body Dressing/Undressing Lower body dressing      What is the patient wearing?: Pants,Underwear/pull up     Lower body assist Assist for lower body dressing: Contact Guard/Touching assist     Toileting Toileting    Toileting assist Assist for toileting: Contact Guard/Touching assist     Transfers Chair/bed transfer  Transfers assist     Chair/bed transfer assist level: Contact Guard/Touching assist     Locomotion Ambulation   Ambulation assist      Assist level: Contact Guard/Touching assist Assistive device: Walker-rolling Max distance: 80   Walk 10 feet activity   Assist     Assist level: Contact Guard/Touching assist Assistive device: Walker-rolling   Walk 50 feet activity   Assist Walk 50 feet with 2 turns activity did not occur: Safety/medical concerns  Assist level: Contact Guard/Touching assist Assistive device: Walker-rolling    Walk 150 feet activity   Assist Walk 150 feet activity did not occur: Safety/medical concerns  Assist level: Contact Guard/Touching assist Assistive device: Walker-rolling    Walk 10 feet on uneven surface  activity   Assist Walk 10 feet on uneven surfaces activity did not occur: Safety/medical concerns          Wheelchair     Assist Will patient use wheelchair at discharge?: No Type of Wheelchair: Manual    Wheelchair assist level: Supervision/Verbal cueing Max wheelchair distance: 131f    Wheelchair 50 feet with 2 turns activity    Assist        Assist Level: Supervision/Verbal cueing   Wheelchair 150 feet activity     Assist      Assist Level: Supervision/Verbal cueing   Blood pressure (!) 153/64, pulse (!) 106, temperature 98.2 F (36.8 C), temperature source Oral, resp. rate 16, height _0  (1.702 m), weight 93.2 kg, SpO2 94 %.    Medical Problem List and Plan: 1. Debility secondary to AKI superimposed on CKD stage III/hyponatremia/gout/atrial fibrillation  Continue CIR  1/4- doing ADLs min A and walking 100+ ft with RW min A per therapy- will arrange for d/c date likely later this week. 1/5- set d/c date as 1/11- suggested to family to move up Heme/Onc appointment.   1/7- Heme/ Onc saw pt, formal consult done-    1/8 episode of "lethargy" this morning. Seems back to baseline. No obvious pharm, ID, or metabolic source for whatever happened. VSS   -observe for now  1/10- back to baseline- con't to monitor 2. Antithrombotics: -DVT/anticoagulation: SCDs -antiplatelet therapy: N/A 3. Pain Management: Tylenol as needed            Controlled on 1/8 4. Mood: Provide emotional support -antipsychotic agents: N/A 5. Neuropsych: This patient is capable of making decisions on her own behalf. 6. Skin/Wound Care: Routine skin checks 7. Fluids/Electrolytes/Nutrition:  8. Acute new onset atrial fibrillation: Monitor with increased exertion.  1/5- rate controlled- con't meds  1/6- rate up to 117 with therapy- will monitor and see if doesn't come down, migh tneed EKG?  1/8 HR in 70's 80's today.   1/10- pulse up to 106 this AM, but when I listened was in 90s- con't regimen 9. H. pylori positive. Completed triple regimen therapy and then  maintain Protonix twice daily 10. Bilateral lower extremity knee pain. Arthrocentesis of both knees. Fluid analysis no evidence of infection but urate crystals diagnostic gouty arthritis. Continue allopurinol. No colchicine due to AKI            1/5- d/c allopurinol since can lower ANC? Tylenol helping pain.  Monitor with increased activity 11. Hypothyroidism: Synthroid 12. Decreased nutritional storage. Dietary follow-up 13. AKI superimposed on CKD stage IIIa.  Creatinine 1.03 on 12/30, labs ordered for tomorrow  1/5- Cr 0.98- con't regimen  1/6- Cr 1.05- fluctuating slightly- will monitor  1/8- Cr 0.99  1/10- Cr down to 0.83 14. History of breast cancer in 1999 treated with lumpectomy radiation as well as tamoxifen. Follow-up outpatient 15. History of lung cancer. Status post partial upper lobe lobectomy 2006. Follow-up outpatient 16. Anemia of chronic disease/pancytopenia. Patient has been seen by hematology services outpatient. Hemoglobin 8.0 on 12/30, 8.7 1/8  1/10- Hb 8.2- slightly up 17. Low albumin: encourage intake of high protein foods.  18.  Labile blood pressure  Relatively controlled on 1/8 19.  Hyponatremia (122  on admit)  Sodium ---133 1/8    20. Hypomagnesemia  12/3- will replete Mg 800 mg BID for Mg of 1.3- K+ is OK- and recheck labs in AM  1/5- will recheck Mg level- was 1.4 yesterday.   1/7- Mg was 1.6 yesterday -doing better  1/10- Mg up to 1.9- con't Mg repletion 21. ANC critically low/pancytopenia.   1/3- will Consult HemeOnc- esp with her hx of Cancer x2- will check Iron, TIBC, ferritin, folate and Vit B12- and daily CBC with diff per Oncology- Dr Lorenso Courier and then if they are normal, migh tneed bone marrow biopsy.   1/4- spoke with grandson- they were planning to do bone marrow biopsy before admission "in the next few weeks"- labs checking iron, etc were OK- will likely need Bone marrow biopsy- but pt would rather  do outpt- she wants to go home. D/w grandson, Rosalita Chessman- they've been thinking MDS for 2+ years, but hadn't done bone marrow biopsy yet.   1/5- arranged for bone marrow biopsy today- going now.   1/6- spoke with Heme/Onc due to pancytopenia getting worse- they need to wait for bone marrow biopsy results to intervene- will do a form consult today  1/7- Heme/Onc made no additional recs in their consult yesterday- waiting for bone marrow biopsy- ANC up slightly to 0.3- but plts down to 67k and Hb down slightly to 8.1-   1/8 CBC stable  1/10- Plts up slightly to 70k- WBC 2.5 but ANC up to 0.5 22. Insomnia  1/3- write for trazodone for sleep.  1/4- slept MUCH better   1/8 don't think trazodone contributed to period of somnolence this morning as she was bright and awake when I saw her earlier 43. Thrombocytopenia  1/5- down to 85k- off blood thinners.   1/6- Plts down to 73k- as per #21.   1/8- Plts 68k- will monitor- no active bleeding!  off blood thinners.   1/10- Plts up to 70k 24. Dispo  1/10- d/c tomorrow-     LOS: 12 days A FACE TO FACE EVALUATION WAS PERFORMED  Casilda Pickerill 11/11/2020, 11:23 AM

## 2020-11-11 NOTE — Progress Notes (Addendum)
Patient ID: Lisa [name], female   DOB: [birth year], 87 y.o.   MRN: [id number]  Spoke with [facility] via telephone to ask if have decided upon home health agency they have not. Informed him there will be a delay in service due to lateness of referral. He will get back with worker once decision is made. He reports waiting for MD to get back with him, regarding readiness for discharge tomorrow. Pt has equipment in room.   12:00 Text sent to MD regarding grandson wanting her to call him  2:00 Pm Spoke with MD who reports pt will not be discharging tomorrow due to daughter his being tested for COVID and pt's medical issues. She will know more tomorrow-have texted grandson to make aware

## 2020-11-11 NOTE — Progress Notes (Signed)
Patient had a good day today with no c/o pain.  Patient ate well all day.  Patient finished dinner and resting in chair with no distress at end of shift.

## 2020-11-11 NOTE — Progress Notes (Signed)
Received call from lab with a critical reading of 0.5 Neutrophil Absolute.  Dr. Dagoberto Ligas informed via message and waiting for any new orders.

## 2020-11-11 NOTE — Progress Notes (Signed)
Occupational Therapy Discharge Summary  Patient Details  Name: Lisa [name] MRN: [id number] Date of Birth: 1934/05/27   Patient has met 10 of 10 long term goals due to improved activity tolerance, improved balance and postural control.  Patient to discharge at overall Supervision level.  Patient's care partner is independent to provide the necessary physical assistance at discharge.  Pt is overall Supervision for LB bathing at sink level (per her request - does not like full showers), LB dressing with occassional use of reacher, close supervision for walking to/from bathroom with RW and performing clothing management and hygiene in same manner. Sit to stands greatly improved with proper body mechanics. Family ed scheduled x2 but daughter unable to attend, performed education over the phone on 1/10.  Reasons goals not met: NA  Recommendation:  Patient will benefit from ongoing skilled OT services in home health setting to continue to advance functional skills in the area of BADL and Reduce care partner burden.  Equipment: BSC and recommending TTB (pt declined to purchase at this time but is aware where to purchase later)  Reasons for discharge: treatment goals met and discharge from hospital  Patient/family agrees with progress made and goals achieved: Yes  OT Discharge Precautions/Restrictions  Precautions Precautions: Fall Restrictions Weight Bearing Restrictions: No Vital Signs Therapy Vitals Temp: 98.4 F (36.9 C) Pulse Rate: 85 Resp: 16 BP: (!) 105/43 Patient Position (if appropriate): Sitting Oxygen Therapy SpO2: 93 % O2 Device: Room Air Pain Pain Assessment Pain Scale: 0-10 Pain Score: 0-No pain Faces Pain Scale: Hurts a little bit Pain Type: Chronic pain Pain Location: Knee Pain Orientation: Right;Left Pain Descriptors / Indicators: Sore Pain Onset: On-going Pain Intervention(s): Repositioned ADL ADL Equipment Provided: Reacher,Long-handled sponge Grooming:  Setup Upper Body Bathing: Setup Where Assessed-Upper Body Bathing: Sitting at sink Lower Body Bathing: Supervision/safety Where Assessed-Lower Body Bathing: Sitting at sink,Standing at sink Upper Body Dressing: Setup Where Assessed-Upper Body Dressing: Sitting at sink Lower Body Dressing: Supervision/safety Where Assessed-Lower Body Dressing: Standing at sink,Sitting at sink Toileting: Supervision/safety Where Assessed-Toileting: Glass blower/designer: Close supervision Toilet Transfer Method: Counselling psychologist: Bedside commode Vision Baseline Vision/History: Wears glasses Wears Glasses: At all times Patient Visual Report: No change from baseline Vision Assessment?: No apparent visual deficits Perception  Perception: Within Functional Limits Praxis Praxis: Intact Cognition Overall Cognitive Status: Within Functional Limits for tasks assessed Arousal/Alertness: Awake/alert Orientation Level: Oriented X4 Memory: Appears intact Awareness: Appears intact Problem Solving: Appears intact Safety/Judgment: Appears intact Sensation Sensation Light Touch: Appears Intact Hot/Cold: Not tested Proprioception: Not tested Stereognosis: Not tested Coordination Gross Motor Movements are Fluid and Coordinated: No (coordinated and functional but slow movements) Fine Motor Movements are Fluid and Coordinated: No (mild tremor at times) Coordination and Movement Description: slowed and with resting tremor in hands Motor  Motor Motor: Within Functional Limits Motor - Discharge Observations: general weakness but improved, slowed coordination Mobility  Transfers Sit to Stand: Supervision/Verbal cueing Stand to Sit: Supervision/Verbal cueing  Trunk/Postural Assessment  Cervical Assessment Cervical Assessment: Within Functional Limits Thoracic Assessment Thoracic Assessment: Exceptions to [facility] Lumbar Assessment Lumbar Assessment: Exceptions to [facility] Postural  Control Postural Control: Within Functional Limits  Balance Balance Balance Assessed: Yes Standardized Balance Assessment Standardized Balance Assessment: Timed Up and Go Test Timed Up and Go Test TUG: Normal TUG Normal TUG (seconds): 56 (w/ walker and S) Static Sitting Balance Static Sitting - Balance Support: Feet supported Static Sitting - Level of Assistance: 7: Independent Dynamic Sitting Balance Dynamic Sitting - Balance Support:  Feet supported Dynamic Sitting - Level of Assistance: 6: Modified independent (Device/Increase time) Dynamic Sitting Balance - Compensations: occasional hand support for seated ADL Dynamic Sitting - Balance Activities: Forward lean/weight shifting;Lateral lean/weight shifting;Reaching for objects Static Standing Balance Static Standing - Balance Support: Right upper extremity supported;During functional activity Static Standing - Level of Assistance: 5: Stand by assistance Dynamic Standing Balance Dynamic Standing - Balance Support: During functional activity;Left upper extremity supported Dynamic Standing - Level of Assistance: 5: Stand by assistance Dynamic Standing - Balance Activities: Lateral lean/weight shifting;Reaching for objects Extremity/Trunk Assessment RUE Assessment RUE Assessment: Within Functional Limits LUE Assessment LUE Assessment: Within Functional Limits   Viona Gilmore 11/11/2020, 12:55 PM

## 2020-11-11 NOTE — Progress Notes (Signed)
Occupational Therapy Session Note  Patient Details  Name: Lisa [name] MRN: [id number] Date of Birth: [birth year]  Today's Date: 11/11/2020 OT Individual Time: 3557-3220 and 2542-7062 OT Individual Time Calculation (min): 54 min and 70 min   Short Term Goals: Week 2:  OT Short Term Goal 1 (Week 2): STGs = LTGs d/t ELOS at Supervision level   Skilled Therapeutic Interventions/Progress Updates:    Session 1:  Pt greeted at time of session sitting up in recliner agreeable to OT session, no c/o pain but did have some knee stiffness later in session. RT and MD present at beginning of session performing med pass for inhaler and checking in. Pt ambulated to/from bathroom with Supervision and performed 3/3 toilet tasks in same manner. Pt able to problem solve through how to pick up pants off the floor if they were to drop with reacher. Discussed DME needs and Supervision at home from daughter/son for safety, note pt saying daughter [name] was not able to come in for family ed scheduled for Friday 1/7. Ambulated bathroom > sink with Supervision with RW, set up to perform UB bathing seated and LB bathing supervision in standing, simulated using LHS for feet as well for home use. Supervision overall for safety in standing. Donned shirt set up and underwear/pants with supervision, able to thread and don over hips before walking to recliner with Supervision. Alarm on, call bell in reach.   Session 2: Pt greeted at time of session sitting up in recliner agreeable to OT session, no pain throughout. Note that pt had questions about going home tomorrow, spoke with SW at beginning of session regarding these questions and daughter getting tested for COVID, recommended pt speak with MD regarding final call on her DC plan. [name] (pt's daughter) called at this time and therapist spoke with daughter regarding current level of Supervision needed for stairs, mobility, ADLs, and min A for car transfers in prep for DC home  and daughter verbalized understanding. Pt stand pivot recliner > w/c and transported to kitchen and engaged in IADL retraining for reaching for items in cabinets, fridge, etc with tips for safety and fall prevention. Also performed TTB transfer with close supervision after therapist demonstration, pt stating she will sponge bathe until she feels comfortable to do this at home. Print out provided for TTB and pulse ox at patient's request. Once back in room stand pivot > recliner Supervision, call bell in reach alarm on.   Therapy Documentation Precautions:  Precautions Precautions: Fall Precaution Comments: unsteady knees Restrictions Weight Bearing Restrictions: No     Therapy/Group: Individual Therapy  Viona Gilmore 11/11/2020, 7:55 AM

## 2020-11-11 NOTE — Progress Notes (Signed)
Physical Therapy Session Note  Patient Details  Name: Lisa [name] MRN: [id number] Date of Birth: 1934/02/23  Today's Date: 11/11/2020 PT Individual Time: 0930- 64   PT Individual Time Calculation (min): 60  Short Term Goals: Week 2:  PT Short Term Goal 1 (Week 2): STG=LTG due to ELOS  Skilled Therapeutic Interventions/Progress Updates:    Patient in recliner and agreeable to PT.  Patient S for transfers throughout session.  Ambulated x 160' with RW and S.  Negotiated 4 steps with rails and close S.  Education about having daughter or son assist from the down side and managing walker up the steps for her.  Performed car transfer to simulated sedan height with min A for sit to stand from low seat.  Patient performed TUG with RW in 56 sec.  Standing balance without UE support with close S reaching to tap targets.  Patient propelled w/c between 150' with S between therapy gyms needing assist for w/c set up.  Patient in room discussed fall prevention tips and need for assist if she does fall and no injury, but to call 911 if injured.  She reported understanding of all.  Left seated in recliner with call bell and needs in reach and chair alarm activated.   Therapy Documentation Precautions:  Precautions Precautions: Fall Precaution Comments: unsteady knees Restrictions Weight Bearing Restrictions: No Pain: Pain Assessment Pain Scale: 0-10 Pain Score: 0-No pain   Therapy/Group: Individual Therapy  [name]  Magda Kiel, PT 11/11/2020, 4:48 PM

## 2020-11-11 NOTE — Progress Notes (Signed)
Physical Therapy Discharge Summary  Patient Details  Name: Lisa [name] MRN: [id number] Date of Birth: 04-14-1934  Today's Date:11/13/2020    Patient has met 9 of 10 long term goals due to improved activity tolerance, improved balance, increased strength and decreased pain.  Patient to discharge at an ambulatory level Supervision.   Patient's care partner was educated by phone on assist level as unable to come in to provide the necessary physical assistance at discharge.  Reasons goals not met: Patient needing min A to rise from low car seat.  Recommendation:  Patient will benefit from ongoing skilled PT services in home health setting to continue to advance safe functional mobility, address ongoing impairments in strength, endurance, balance, and minimize fall risk.  Equipment: rolling walker  Reasons for discharge: treatment goals met and discharge from hospital  Patient/family agrees with progress made and goals achieved: Yes  PT Discharge Precautions/Restrictions Precautions Precautions: Fall Restrictions Weight Bearing Restrictions: No Vital Signs Therapy Vitals Temp: 98.4 F (36.9 C) Pulse Rate: 85 Resp: 16 BP: (!) 105/43 Patient Position (if appropriate): Sitting Oxygen Therapy SpO2: 93 % O2 Device: Room Air Pain Pain Assessment Pain Scale: 0-10 Pain Score: 0-No pain Faces Pain Scale: Hurts a little bit Pain Type: Chronic pain Pain Location: Knee Pain Orientation: Right;Left Pain Descriptors / Indicators: Sore Pain Onset: On-going Pain Intervention(s): Repositioned Vision/Perception  Perception Perception: Within Functional Limits Praxis Praxis: Intact  Cognition Overall Cognitive Status: Within Functional Limits for tasks assessed Arousal/Alertness: Awake/alert Orientation Level: Oriented X4 Memory: Appears intact Awareness: Appears intact Problem Solving: Appears intact Safety/Judgment: Appears intact Sensation Sensation Light Touch: Appears  Intact Hot/Cold: Not tested Proprioception: Not tested Stereognosis: Not tested Coordination Gross Motor Movements are Fluid and Coordinated: No (coordinated and functional but slow movements) Fine Motor Movements are Fluid and Coordinated: No (mild tremor at times) Coordination and Movement Description: slowed and with resting tremor in hands Motor  Motor Motor: Within Functional Limits Motor - Discharge Observations: general weakness but improved, slowed coordination  Mobility Bed Mobility Bed Mobility: Rolling Right;Rolling Left;Supine to Sit;Sit to Supine Rolling Right: Independent with assistive device Rolling Left: Independent with assistive device Supine to Sit: Independent Sit to Supine: Independent Transfers Transfers: Sit to Stand;Stand to Sit;Stand Pivot Transfers Sit to Stand: Supervision/Verbal cueing Stand to Sit: Supervision/Verbal cueing Stand Pivot Transfers: Supervision/Verbal cueing Stand Pivot Transfer Details: Verbal cues for safe use of DME/AE;Verbal cues for precautions/safety Transfer (Assistive device): Rolling walker Locomotion  Gait Ambulation: Yes Gait Assistance: Supervision/Verbal cueing Gait Distance (Feet): 160 Feet Assistive device: Rolling walker Gait Assistance Details: Verbal cues for precautions/safety Gait Assistance Details: occasional cues for walker proximity Gait Gait: Yes Gait Pattern: Impaired Gait Pattern: Step-through pattern;Decreased hip/knee flexion - left;Antalgic Stairs / Additional Locomotion Stairs: Yes Stairs Assistance: Supervision/Verbal cueing Stair Management Technique: Two rails;Step to pattern;Forwards Number of Stairs: 4 Height of Stairs: 6 Wheelchair Mobility Wheelchair Mobility: Yes Wheelchair Assistance: Chartered loss adjuster: Both upper extremities Wheelchair Parts Management: Needs assistance Distance: 150'  Trunk/Postural Assessment  Cervical Assessment Cervical Assessment:  Within Functional Limits Thoracic Assessment Thoracic Assessment: Exceptions to [facility] Lumbar Assessment Lumbar Assessment: Exceptions to [facility] Postural Control Postural Control: Within Functional Limits  Balance Balance Balance Assessed: Yes Standardized Balance Assessment Standardized Balance Assessment: Timed Up and Go Test Timed Up and Go Test TUG: Normal TUG Normal TUG (seconds): 56 (w/ walker and S) Static Sitting Balance Static Sitting - Balance Support: Feet supported Static Sitting - Level of Assistance: 7: Independent Dynamic Sitting Balance Dynamic  Sitting - Balance Support: Feet supported Dynamic Sitting - Level of Assistance: 6: Modified independent (Device/Increase time) Dynamic Sitting Balance - Compensations: occasional hand support for seated ADL Dynamic Sitting - Balance Activities: Forward lean/weight shifting;Lateral lean/weight shifting;Reaching for objects Static Standing Balance Static Standing - Balance Support: Right upper extremity supported;During functional activity Static Standing - Level of Assistance: 5: Stand by assistance Dynamic Standing Balance Dynamic Standing - Balance Support: During functional activity;Left upper extremity supported Dynamic Standing - Level of Assistance: 5: Stand by assistance Dynamic Standing - Balance Activities: Lateral lean/weight shifting;Reaching for objects Extremity Assessment  RUE Assessment RUE Assessment: Within Functional Limits LUE Assessment LUE Assessment: Within Functional Limits RLE Assessment RLE Assessment: Exceptions to [facility] Active Range of Motion (AROM) Comments: AROM WFL General Strength Comments: hip flexion 3/5, knee extension 4-/5, flexion 3+/5, ankle DF 4+/5 LLE Assessment LLE Assessment: Exceptions to [facility] Active Range of Motion (AROM) Comments: Generally WFL for mobility General Strength Comments: hip flexion 3+/5, knee extension 4+/5, knee flexion 4-/5, ankle DF 4+/5    Jamison Oka, PT 11/13/2020  11/11/2020, 12:56 PM

## 2020-11-12 ENCOUNTER — Inpatient Hospital Stay (HOSPITAL_COMMUNITY): Payer: Medicare Other | Admitting: Occupational Therapy

## 2020-11-12 ENCOUNTER — Other Ambulatory Visit (HOSPITAL_COMMUNITY): Payer: Medicare Other

## 2020-11-12 ENCOUNTER — Inpatient Hospital Stay (HOSPITAL_COMMUNITY): Payer: Medicare Other

## 2020-11-12 LAB — PATHOLOGIST SMEAR REVIEW

## 2020-11-12 NOTE — Progress Notes (Signed)
Physical Therapy Session Note  Patient Details  Name: Lisa [name] MRN: [id number] Date of Birth: 03-31-1934  Today's Date: 11/12/2020 PT Individual Time: 0921-1025 PT Individual Time Calculation (min): 64 min   Short Term Goals: Week 2:  PT Short Term Goal 1 (Week 2): STG=LTG due to ELOS  Skilled Therapeutic Interventions/Progress Updates:    Patient seated in recliner and reports slept well, but had to have medication.  Performed seated therex consisting of SLR, SAQ w/ 5 sec hold, hip abduction, adductor squeezes, seated rows w/ orange t-band all x 10 and, sit<>stand x 5.  Issued all for HEP including bridging and reviewed with pt.  Sit to stand to w/c with S.  Pushed w/c x 150' with S, then assisted rest of the way to dayroom.  Standing activity using blocks to build designs on cards standing with S no UE support near table.  Patient ambulated through simulated home environment with chairs and "coffee table" and demonstrated how to step through tight space sideways and pt performed with S using RW.  Patient educated about setting up furniture to ease using walker for safety with family assistance.  Patient assisted in w/c to room.  Sit to stand to step to recliner with RW and S, though initially having difficulty coming up to stand after cues to push with L arm pt able to stand easier.  Left seated in recliner with needs in reach and chair alarm activated.   Therapy Documentation Precautions:  Precautions Precautions: Fall Precaution Comments: unsteady knees Restrictions Weight Bearing Restrictions: No Pain: Pain Assessment Faces Pain Scale: Hurts a little bit Pain Type: Chronic pain Pain Location: Knee Pain Orientation: Right;Left Pain Descriptors / Indicators: Sore Pain Onset: On-going    Therapy/Group: Individual Therapy  [name]  Magda Kiel, PT 11/12/2020, 10:13 AM

## 2020-11-12 NOTE — Progress Notes (Signed)
Lisa [name] PHYSICAL MEDICINE & REHABILITATION PROGRESS NOTE   Subjective/Complaints:  Pt reports LBM was yesterday- daughter's COVID test is (-), called pt's daughter- she said her nausea, etc is better- we can arrange for d/c tomorrow.   Also called Dr Lorenso Courier- oncology- to discuss plan for Bone marrow biopsy- he plans on calling to see if they have preliminary report today- and base don that, determine if AML or MDS- if MDS, Oncology appt ok; if AML, will send urgent referral to [facility] [place] [facility].   Also called grandson to give him this info.   Pt denies any Sx's of UTI/resp illness.  ROS:  Pt denies SOB, abd pain, CP, N/V/C/D, and vision changes    Objective:   No results found. Recent Labs    11/11/20 0552  WBC 2.5*  HGB 8.2*  HCT 28.7*  PLT 70*   Recent Labs    11/11/20 0552  NA 134*  K 4.7  CL 98  CO2 28  GLUCOSE 91  BUN 7*  CREATININE 0.83  CALCIUM 9.1    Intake/Output Summary (Last 24 hours) at 11/12/2020 0912 Last data filed at 11/12/2020 0734 Gross per 24 hour  Intake 940 ml  Output --  Net 940 ml        Physical Exam: Vital Signs Blood pressure (!) 105/47, pulse 81, temperature 97.9 F (36.6 C), resp. rate 16, height '5\' 7"'  (1.702 m), weight 93.2 kg, SpO2 96 %. Constitutional: awake, alert, sitting up in bedside chair, finished breakfast, NAD HEENT: EOMI, oral membranes moist Neck: supple Cardiovascular: RRR- no JVD Respiratory/Chest: CTA B/L- no W/R/R- good air movement GI/Abdomen: .Soft, NT, ND, (+)BS  Ext: no clubbing, cyanosis, or edema Skin: Warm and dry.  Intact. Psych: appropriate, unhappy not leaving today.  Musc: Mild lower extreme edema No tenderness in extremities. Neuro:  Alert and oriented x 3. Normal insight and awareness. Intact Memory. Normal language and speech. Cranial nerve exam unremarkable  HOH. No sensory deficits Bilateral upper extremities: 5/5 proximal distal  Bilateral lower extremities: Hip flexion: 4-4+/5, distally  5/5, stable  Assessment/Plan: 1. Functional deficits which require 3+ hours per day of interdisciplinary therapy in a comprehensive inpatient rehab setting.  Physiatrist is providing close team supervision and 24 hour management of active medical problems listed below.  Physiatrist and rehab team continue to assess barriers to discharge/monitor patient progress toward functional and medical goals  Care Tool:  Bathing    Body parts bathed by patient: Right arm,Left arm,Chest,Abdomen,Front perineal area,Right upper leg,Left upper leg,Face,Buttocks,Right lower leg,Left lower leg   Body parts bathed by helper: Right lower leg,Left lower leg     Bathing assist Assist Level: Contact Guard/Touching assist     Upper Body Dressing/Undressing Upper body dressing   What is the patient wearing?: Pull over shirt    Upper body assist Assist Level: Set up assist    Lower Body Dressing/Undressing Lower body dressing      What is the patient wearing?: Pants,Underwear/pull up     Lower body assist Assist for lower body dressing: Contact Guard/Touching assist     Toileting Toileting    Toileting assist Assist for toileting: Supervision/Verbal cueing     Transfers Chair/bed transfer  Transfers assist     Chair/bed transfer assist level: Supervision/Verbal cueing     Locomotion Ambulation   Ambulation assist      Assist level: Supervision/Verbal cueing Assistive device: Walker-rolling Max distance: 150'   Walk 10 feet activity   Assist  Assist level: Supervision/Verbal cueing Assistive device: Walker-rolling   Walk 50 feet activity   Assist Walk 50 feet with 2 turns activity did not occur: Safety/medical concerns  Assist level: Supervision/Verbal cueing Assistive device: Walker-rolling    Walk 150 feet activity   Assist Walk 150 feet activity did not occur: Safety/medical concerns  Assist level: Supervision/Verbal cueing Assistive device:  Walker-rolling    Walk 10 feet on uneven surface  activity   Assist Walk 10 feet on uneven surfaces activity did not occur: Safety/medical concerns   Assist level: Supervision/Verbal cueing Assistive device: Aeronautical engineer Will patient use wheelchair at discharge?: No Type of Wheelchair: Manual    Wheelchair assist level: Supervision/Verbal cueing Max wheelchair distance: 157f    Wheelchair 50 feet with 2 turns activity    Assist        Assist Level: Supervision/Verbal cueing   Wheelchair 150 feet activity     Assist      Assist Level: Supervision/Verbal cueing   Blood pressure (!) 105/47, pulse 81, temperature 97.9 F (36.6 C), resp. rate 16, height '5\' 7"'  (18.003m), weight 93.2 kg, SpO2 96 %.    Medical Problem List and Plan: 1. Debility secondary to AKI superimposed on CKD stage III/hyponatremia/gout/atrial fibrillation  Continue CIR  1/4- doing ADLs min A and walking 100+ ft with RW min A per therapy- will arrange for d/c date likely later this week. 1/5- set d/c date as 1/11- suggested to family to move up Heme/Onc appointment.   1/7- Heme/ Onc saw pt, formal consult done-    1/8 episode of "lethargy" this morning. Seems back to baseline. No obvious pharm, ID, or metabolic source for whatever happened. VSS   -observe for now  1/10- back to baseline- con't to monitor 2. Antithrombotics: -DVT/anticoagulation: SCDs -antiplatelet therapy: N/A 3. Pain Management: Tylenol as needed            Controlled on 1/8 4. Mood: Provide emotional support -antipsychotic agents: N/A 5. Neuropsych: This patient is capable of making decisions on her own behalf. 6. Skin/Wound Care: Routine skin checks 7. Fluids/Electrolytes/Nutrition:  8. Acute new onset atrial fibrillation: Monitor with increased exertion.  1/5- rate controlled- con't meds  1/6- rate up to 117 with therapy- will monitor and see if doesn't  come down, migh tneed EKG?  1/8 HR in 70's 80's today.   1/10- pulse up to 106 this AM, but when I listened was in 90s- con't regimen  1/11- pulse in 80s- con't regimen 9. H. pylori positive. Completed triple regimen therapy and then maintain Protonix twice daily 10. Bilateral lower extremity knee pain. Arthrocentesis of both knees. Fluid analysis no evidence of infection but urate crystals diagnostic gouty arthritis. Continue allopurinol. No colchicine due to AKI            1/5- d/c allopurinol since can lower ANC? Tylenol helping pain.  Monitor with increased activity 11. Hypothyroidism: Synthroid 12. Decreased nutritional storage. Dietary follow-up 13. AKI superimposed on CKD stage IIIa.  Creatinine 1.03 on 12/30, labs ordered for tomorrow  1/5- Cr 0.98- con't regimen  1/6- Cr 1.05- fluctuating slightly- will monitor  1/8- Cr 0.99  1/10- Cr down to 0.83 14. History of breast cancer in 1999 treated with lumpectomy radiation as well as tamoxifen. Follow-up outpatient 15. History of lung cancer. Status post partial upper lobe lobectomy 2006. Follow-up outpatient 16. Anemia of chronic disease/pancytopenia. Patient has been seen by hematology services outpatient. Hemoglobin 8.0 on  12/30, 8.7 1/8  1/10- Hb 8.2- slightly up  1/11- labs in AM before d/c.  17. Low albumin: encourage intake of high protein foods.  18.  Labile blood pressure  Relatively controlled on 1/8 19.  Hyponatremia (122 on admit)  Sodium ---133 1/8    20. Hypomagnesemia  12/3- will replete Mg 800 mg BID for Mg of 1.3- K+ is OK- and recheck labs in AM  1/5- will recheck Mg level- was 1.4 yesterday.   1/7- Mg was 1.6 yesterday -doing better  1/10- Mg up to 1.9- con't Mg repletion 21. ANC critically low/pancytopenia.   1/3- will Consult HemeOnc- esp with her hx of Cancer x2- will check Iron, TIBC, ferritin, folate and Vit B12- and daily CBC with diff per Oncology- Dr  Lorenso Courier and then if they are normal, migh tneed bone marrow biopsy.   1/4- spoke with grandson- they were planning to do bone marrow biopsy before admission "in the next few weeks"- labs checking iron, etc were OK- will likely need Bone marrow biopsy- but pt would rather do outpt- she wants to go home. D/w grandson, Rosalita Chessman- they've been thinking MDS for 2+ years, but hadn't done bone marrow biopsy yet.   1/5- arranged for bone marrow biopsy today- going now.   1/6- spoke with Heme/Onc due to pancytopenia getting worse- they need to wait for bone marrow biopsy results to intervene- will do a form consult today  1/7- Heme/Onc made no additional recs in their consult yesterday- waiting for bone marrow biopsy- ANC up slightly to 0.3- but plts down to 67k and Hb down slightly to 8.1-   1/8 CBC stable  1/10- Plts up slightly to 70k- WBC 2.5 but ANC up to 0.5  1/11- labs again tomorrow- spoke with Dr Lorenso Courier- will try and get preliminary report so can make [facility] appt if needed- if it's AML; if MDS, appt on 1/18 is good.   22. Insomnia  1/3- write for trazodone for sleep.  1/4- slept MUCH better   1/8 don't think trazodone contributed to period of somnolence this morning as she was bright and awake when I saw her earlier 60. Thrombocytopenia  1/5- down to 85k- off blood thinners.   1/6- Plts down to 73k- as per #21.   1/8- Plts 68k- will monitor- no active bleeding!  off blood thinners.   1/10- Plts up to 70k 24. Dispo  1/10- d/c tomorrow-  1/11- d/c tomorrow  I spent a total of 50 minutes on pt today- >50% on coordination of care- calling pt's daughter and grandson as well as Dr Lorenso Courier- Oncology.        LOS: 13 days A FACE TO FACE EVALUATION WAS PERFORMED  Tyriek Hofman 11/12/2020, 9:12 AM

## 2020-11-12 NOTE — Progress Notes (Signed)
Physical Therapy Session Note  Patient Details  Name: Lisa [name] MRN: [id number] Date of Birth: [birth year]  Today's Date: 11/12/2020 PT Individual Time: 3462-1947 PT Individual Time Calculation (min): 40 min   Short Term Goals: Week 1:  PT Short Term Goal 1 (Week 1): Patient to perform transfers with min A. PT Short Term Goal 1 - Progress (Week 1): Met PT Short Term Goal 2 (Week 1): Patient to ambulate 61' with RW and min A. PT Short Term Goal 2 - Progress (Week 1): Met PT Short Term Goal 3 (Week 1): Patient to negotiate 8 short steps with rails and min A PT Short Term Goal 3 - Progress (Week 1): Met Week 2:  PT Short Term Goal 1 (Week 2): STG=LTG due to ELOS  Skilled Therapeutic Interventions/Progress Updates:    Session focused on functional mobility with RW in home environment in room and toilet transfers and balance (supervision level), NMR for balance retraining including lateral (R and L) sidestepping, retro gait, and obstacle negotiation, NMR during sit <> stands blocked practice with focus on decreasing UE reliance (unable without UE support) and focusing on achieving upright balance without UE support, and gait on unit x 150' with supervision with RW with cues for upright posture and positioning of RW for improved posture. Transferred to recliner end of session with supervision and all needs in reach.  Therapy Documentation Precautions:  Precautions Precautions: Fall Precaution Comments: unsteady knees Restrictions Weight Bearing Restrictions: No  Pain:  Denies pain.   Therapy/Group: Individual Therapy  Canary Brim Ivory Broad, PT, DPT, CBIS  11/12/2020, 8:49 AM

## 2020-11-12 NOTE — Patient Care Conference (Signed)
Inpatient RehabilitationTeam Conference and Plan of Care Update Date: 11/12/2020   Time: 11:37 AM    Patient Name: Lisa [name]      Medical Record Number: [id number]  Date of Birth: Sep 26, 1934 Sex: Female         Room/Bed: 4M07C/4M07C-01 Payor Info: Payor: MEDICARE / Plan: MEDICARE PART A AND B / Product Type: *No Product type* /    Admit Date/Time:  10/30/2020  3:05 PM  Primary Diagnosis:  [facility] Problems: Principal Problem:   Debility Active Problems:   Hyponatremia   Labile blood pressure    Expected Discharge Date: Expected Discharge Date: 11/13/20  Team Members Present: Physician leading conference: Dr. Courtney Heys Care Coodinator Present: Dorthula Nettles, RN, BSN, CRRN;Becky Dupree, LCSW Nurse Present: Mohammed Kindle, RN PT Present: Magda Kiel, PT OT Present: Willeen Cass, OT PPS Coordinator present : Gunnar Fusi, SLP     Current Status/Progress Goal Weekly Team Focus  Bowel/Bladder   Continent of B/B. LBM 11/11/2020  Remain Continent  Assess Q shift and PRN   Swallow/Nutrition/ Hydration             ADL's   close supervision with LB bathe at sink level (per her request) w/ LHS, Supervision LB dress, close supervision walk to/from bathroom, toilet in same manner, sit to stands improved with handles to push up from  Supervision overall  continued ADL retraining for improved safety, continue AE training for LB ADL, endurance/tolerance   Mobility   S transfers except min A car transfer, ambulation 160' with RW S,  Stairs x 4 with rails S; S bed mobility, w/c mobility and standing balance.  S overall, except CGA car transfer  endurance, postural strength, balance, ambulation   Communication             Safety/Cognition/ Behavioral Observations            Pain   No pain reported  Pain <3  Assess Q shift and PRN   Skin   closed incision on lower back, medial. No further skin issues.  Maintain skin integrity  Assess Q shift and PRN     Discharge  Planning:  Met supervision level goals-family aware of pt's 24/7 supervision needs. Daughter failed to come in for education due to illness.   Team Discussion: BP goes back and forth, patient has not complaints of pain, skin is CDI, and continent B/B. Patient on target to meet rehab goals: Patient is at goal level, and walking with supervision.  *See Care Plan and progress notes for long and short-term goals.   Revisions to Treatment Plan:    Teaching Needs: Family education complete.  Current Barriers to Discharge: Decreased caregiver support, Home enviroment access/layout and Behavior  Possible Resolutions to Barriers: Provide emotional support to patient and family.     Medical Summary Current Status: pt has no signs of infection; contient B/B; no skin issues  Barriers to Discharge: Home enviroment access/layout;Medical stability;Medication compliance;Pending chemo/radiation  Barriers to Discharge Comments: OT- at goal levels; PT- supervision except car- min A; walking >100 ft RW S; Possible Resolutions to [employer] Focus: talked to grandson/daughter about function and bone marrow biopsy- d/c tomorrow   Continued Need for Acute Rehabilitation Level of Care: The patient requires daily medical management by a physician with specialized training in physical medicine and rehabilitation for the following reasons: Direction of a multidisciplinary physical rehabilitation program to maximize functional independence : Yes Medical management of patient stability for increased activity during participation in  an intensive rehabilitation regime.: Yes Analysis of laboratory values and/or radiology reports with any subsequent need for medication adjustment and/or medical intervention. : Yes   I attest that I was present, lead the team conference, and concur with the assessment and plan of the team.   Cristi Loron 11/12/2020, 3:03 PM

## 2020-11-12 NOTE — Progress Notes (Addendum)
Patient ID: Lisa [name], female   DOB: 1934-09-21, 87 y.o.   MRN: [id number] grandson has decided upon home health agency he wants [facility] to provide home health to pt. Await MD when ready for discharge.  12:00 Met with pt to inform team conference and plan for discharge home tomorrow. Pt wanted to go today but aware of medical issues and daughter testing negative for COVID. Home health and equipment arranged.

## 2020-11-13 LAB — BASIC METABOLIC PANEL
Anion gap: 9 (ref 5–15)
BUN: 5 mg/dL — ABNORMAL LOW (ref 8–23)
CO2: 28 mmol/L (ref 22–32)
Calcium: 9.2 mg/dL (ref 8.9–10.3)
Chloride: 94 mmol/L — ABNORMAL LOW (ref 98–111)
Creatinine, Ser: 0.86 mg/dL (ref 0.44–1.00)
GFR, Estimated: >60 mL/min (ref >60)
Glucose, Bld: 92 mg/dL (ref 70–99)
Potassium: 4.5 mmol/L (ref 3.5–5.1)
Sodium: 131 mmol/L — ABNORMAL LOW (ref 135–145)

## 2020-11-13 NOTE — Plan of Care (Signed)
  Problem: RH Balance Goal: LTG Patient will maintain dynamic sitting balance (PT) Description: LTG:  Patient will maintain dynamic sitting balance with assistance during mobility activities (PT) Outcome: Completed/Met   Problem: Sit to Stand Goal: LTG:  Patient will perform sit to stand with assistance level (PT) Description: LTG:  Patient will perform sit to stand with assistance level (PT) Outcome: Completed/Met   Problem: RH Bed Mobility Goal: LTG Patient will perform bed mobility with assist (PT) Description: LTG: Patient will perform bed mobility with assistance, with/without cues (PT). Outcome: Completed/Met   Problem: RH Bed to Chair Transfers Goal: LTG Patient will perform bed/chair transfers w/assist (PT) Description: LTG: Patient will perform bed to chair transfers with assistance (PT). Outcome: Completed/Met   Problem: RH Car Transfers Goal: LTG Patient will perform car transfers with assist (PT) Description: LTG: Patient will perform car transfers with assistance (PT). Outcome: Not Met (add Reason)   Problem: RH Furniture Transfers Goal: LTG Patient will perform furniture transfers w/assist (OT/PT) Description: LTG: Patient will perform furniture transfers  with assistance (OT/PT). Outcome: Completed/Met   Problem: RH Ambulation Goal: LTG Patient will ambulate in controlled environment (PT) Description: LTG: Patient will ambulate in a controlled environment, # of feet with assistance (PT). Outcome: Completed/Met   Problem: RH Ambulation Goal: LTG Patient will ambulate in home environment (PT) Description: LTG: Patient will ambulate in home environment, # of feet with assistance (PT). Outcome: Completed/Met   Problem: RH Wheelchair Mobility Goal: LTG Patient will propel w/c in controlled environment (PT) Description: LTG: Patient will propel wheelchair in controlled environment, # of feet with assist (PT) Outcome: Completed/Met   Problem: RH Stairs Goal: LTG  Patient will ambulate up and down stairs w/assist (PT) Description: LTG: Patient will ambulate up and down # of stairs with assistance (PT) Outcome: Completed/Met  Magda Kiel, PT

## 2020-11-13 NOTE — Progress Notes (Signed)
[facility] PHYSICAL MEDICINE & REHABILITATION PROGRESS NOTE   Subjective/Complaints:  Pt reports she's doing well and is leaving today- asking what time daughter needs to come get her.   Spoke with Dr Lorenso Courier- is NOT AML- and needs to f/u with Oncology as scheduled on 1/18.   Ate 100% breakfast.   ROS:   Pt denies SOB, abd pain, CP, N/V/C/D, and vision changes    Objective:   No results found. Recent Labs    11/11/20 0552 11/13/20 0629  WBC 2.5* 2.1*  HGB 8.2* 8.3*  HCT 28.7* 29.0*  PLT 70* 75*   Recent Labs    11/11/20 0552 11/13/20 0629  NA 134* 131*  K 4.7 4.5  CL 98 94*  CO2 28 28  GLUCOSE 91 92  BUN 7* 5*  CREATININE 0.83 0.86  CALCIUM 9.1 9.2    Intake/Output Summary (Last 24 hours) at 11/13/2020 0846 Last data filed at 11/12/2020 1806 Gross per 24 hour  Intake 1200 ml  Output -  Net 1200 ml        Physical Exam: Vital Signs Blood pressure (!) 141/61, pulse 74, temperature 97.6 F (36.4 C), temperature source Oral, resp. rate 16, height '5\' 7"'  (1.702 m), weight 93.2 kg, SpO2 92 %. Constitutional: awake, alert, appropriate, sitting up in bedside chair, ate 100% breakfast, bright affect, NAD HEENT: EOMI, oral membranes moist Neck: supple Cardiovascular: RRR Respiratory/Chest: CTA B/L- no W/R/R- good air movement GI/Abdomen: .Soft, NT, ND, (+)BS  Ext: no clubbing, cyanosis, or edema Skin: Warm and dry.  Intact. Psych: bright affect  Musc: Mild lower extreme edema No tenderness in extremities. Neuro:  Alert and oriented x 3. Normal insight and awareness. Intact Memory. Normal language and speech. Cranial nerve exam unremarkable  HOH. No sensory deficits Bilateral upper extremities: 5/5 proximal distal  Bilateral lower extremities: Hip flexion: 4-4+/5, distally 5/5, stable  Assessment/Plan: 1. Functional deficits which require 3+ hours per day of interdisciplinary therapy in a comprehensive inpatient rehab setting.  Physiatrist is providing  close team supervision and 24 hour management of active medical problems listed below.  Physiatrist and rehab team continue to assess barriers to discharge/monitor patient progress toward functional and medical goals  Care Tool:  Bathing    Body parts bathed by patient: Right arm,Left arm,Chest,Abdomen,Front perineal area,Right upper leg,Left upper leg,Face,Buttocks,Right lower leg,Left lower leg   Body parts bathed by helper: Right lower leg,Left lower leg     Bathing assist Assist Level: Contact Guard/Touching assist     Upper Body Dressing/Undressing Upper body dressing   What is the patient wearing?: Pull over shirt    Upper body assist Assist Level: Set up assist    Lower Body Dressing/Undressing Lower body dressing      What is the patient wearing?: Pants,Underwear/pull up     Lower body assist Assist for lower body dressing: Contact Guard/Touching assist     Toileting Toileting    Toileting assist Assist for toileting: Supervision/Verbal cueing     Transfers Chair/bed transfer  Transfers assist     Chair/bed transfer assist level: Supervision/Verbal cueing     Locomotion Ambulation   Ambulation assist      Assist level: Supervision/Verbal cueing Assistive device: Walker-rolling Max distance: 150'   Walk 10 feet activity   Assist     Assist level: Supervision/Verbal cueing Assistive device: Walker-rolling   Walk 50 feet activity   Assist Walk 50 feet with 2 turns activity did not occur: Safety/medical concerns  Assist level: Supervision/Verbal  cueing Assistive device: Walker-rolling    Walk 150 feet activity   Assist Walk 150 feet activity did not occur: Safety/medical concerns  Assist level: Supervision/Verbal cueing Assistive device: Walker-rolling    Walk 10 feet on uneven surface  activity   Assist Walk 10 feet on uneven surfaces activity did not occur: Safety/medical concerns   Assist level: Supervision/Verbal  cueing Assistive device: Aeronautical engineer Will patient use wheelchair at discharge?: No Type of Wheelchair: Manual    Wheelchair assist level: Supervision/Verbal cueing Max wheelchair distance: 161f    Wheelchair 50 feet with 2 turns activity    Assist        Assist Level: Supervision/Verbal cueing   Wheelchair 150 feet activity     Assist      Assist Level: Supervision/Verbal cueing   Blood pressure (!) 141/61, pulse 74, temperature 97.6 F (36.4 C), temperature source Oral, resp. rate 16, height '5\' 7"'  (1.702 m), weight 93.2 kg, SpO2 92 %.    Medical Problem List and Plan: 1. Debility secondary to AKI superimposed on CKD stage III/hyponatremia/gout/atrial fibrillation  Continue CIR  1/4- doing ADLs min A and walking 100+ ft with RW min A per therapy- will arrange for d/c date likely later this week. 1/5- set d/c date as 1/11- suggested to family to move up Heme/Onc appointment.   1/7- Heme/ Onc saw pt, formal consult done-    1/8 episode of "lethargy" this morning. Seems back to baseline. No obvious pharm, ID, or metabolic source for whatever happened. VSS   -observe for now  1/10- back to baseline- con't to monitor 2. Antithrombotics: -DVT/anticoagulation: SCDs -antiplatelet therapy: N/A 3. Pain Management: Tylenol as needed            Controlled on 1/8 4. Mood: Provide emotional support -antipsychotic agents: N/A 5. Neuropsych: This patient is capable of making decisions on her own behalf. 6. Skin/Wound Care: Routine skin checks 7. Fluids/Electrolytes/Nutrition:  8. Acute new onset atrial fibrillation: Monitor with increased exertion.  1/5- rate controlled- con't meds  1/6- rate up to 117 with therapy- will monitor and see if doesn't come down, migh tneed EKG?  1/8 HR in 70's 80's today.   1/10- pulse up to 106 this AM, but when I listened was in 90s- con't regimen  1/11- pulse in 80s- con't  regimen 9. H. pylori positive. Completed triple regimen therapy and then maintain Protonix twice daily 10. Bilateral lower extremity knee pain. Arthrocentesis of both knees. Fluid analysis no evidence of infection but urate crystals diagnostic gouty arthritis. Continue allopurinol. No colchicine due to AKI            1/5- d/c allopurinol since can lower ANC? Tylenol helping pain.  Monitor with increased activity 11. Hypothyroidism: Synthroid 12. Decreased nutritional storage. Dietary follow-up 13. AKI superimposed on CKD stage IIIa.  Creatinine 1.03 on 12/30, labs ordered for tomorrow  1/5- Cr 0.98- con't regimen  1/6- Cr 1.05- fluctuating slightly- will monitor  1/8- Cr 0.99  1/10- Cr down to 0.83  11/12- Cr down to 0.86 14. History of breast cancer in 1999 treated with lumpectomy radiation as well as tamoxifen. Follow-up outpatient 15. History of lung cancer. Status post partial upper lobe lobectomy 2006. Follow-up outpatient 16. Anemia of chronic disease/pancytopenia. Patient has been seen by hematology services outpatient. Hemoglobin 8.0 on 12/30, 8.7 1/8  1/10- Hb 8.2- slightly up  1/11- labs in AM before d/c.  1/12- Hb 8.6  17. Low  albumin: encourage intake of high protein foods.  18.  Labile blood pressure  Relatively controlled on 1/8 19.  Hyponatremia (122 on admit)  Sodium ---133 1/8    20. Hypomagnesemia  12/3- will replete Mg 800 mg BID for Mg of 1.3- K+ is OK- and recheck labs in AM  1/5- will recheck Mg level- was 1.4 yesterday.   1/7- Mg was 1.6 yesterday -doing better  1/10- Mg up to 1.9- con't Mg repletion 21. ANC critically low/pancytopenia.   1/3- will Consult HemeOnc- esp with her hx of Cancer x2- will check Iron, TIBC, ferritin, folate and Vit B12- and daily CBC with diff per Oncology- Dr Lorenso Courier and then if they are normal, migh tneed bone marrow biopsy.   1/4- spoke with grandson- they were planning to do  bone marrow biopsy before admission "in the next few weeks"- labs checking iron, etc were OK- will likely need Bone marrow biopsy- but pt would rather do outpt- she wants to go home. D/w grandson, Rosalita Chessman- they've been thinking MDS for 2+ years, but hadn't done bone marrow biopsy yet.   1/5- arranged for bone marrow biopsy today- going now.   1/6- spoke with Heme/Onc due to pancytopenia getting worse- they need to wait for bone marrow biopsy results to intervene- will do a form consult today  1/7- Heme/Onc made no additional recs in their consult yesterday- waiting for bone marrow biopsy- ANC up slightly to 0.3- but plts down to 67k and Hb down slightly to 8.1-   1/8 CBC stable  1/10- Plts up slightly to 70k- WBC 2.5 but ANC up to 0.5  1/11- labs again tomorrow- spoke with Dr Lorenso Courier- will try and get preliminary report so can make [facility] appt if needed- if it's AML; if MDS, appt on 1/18 is good.  1/12- spoke to Dr Lorenso Courier- doesn't have AML- can f/u with Oncology as scheduled 1/18 per Dr Lorenso Courier 22. Insomnia  1/3- write for trazodone for sleep.  1/4- slept MUCH better   1/8 don't think trazodone contributed to period of somnolence this morning as she was bright and awake when I saw her earlier 51. Thrombocytopenia  1/5- down to 85k- off blood thinners.   1/6- Plts down to 73k- as per #21.   1/8- Plts 68k- will monitor- no active bleeding!  off blood thinners.   1/10- Plts up to 70k 24. Dispo  1/10- d/c tomorrow-  1/11- d/c tomorrow   1/12- d/c today- let grandson know through SW that pt doesn't have AML per Dr Dorsey/prelim bone marrow and can f/u with Oncology 1/18.         LOS: 14 days A FACE TO FACE EVALUATION WAS PERFORMED  Nimco Bivens 11/13/2020, 8:46 AM

## 2020-11-13 NOTE — Progress Notes (Signed)
Patient discharged off of unit with all belongings with daughter present.  Discharge papers/instructions explained by physician assistant to family.  Patient and family have no further questions at time of discharge.  No complications noted at this time.

## 2020-11-13 NOTE — Progress Notes (Signed)
Inpatient Rehabilitation Care Coordinator Discharge Note  The overall goal for the admission was met for:   Discharge location: [place] 24/7 SUPERVISION  Length of Stay: Yes-14 DAYS  Discharge activity level: Yes-SUPERVISION LEVEL  Home/community participation: Yes  Services provided included: MD, RD, PT, OT, SLP, RN, CM, Pharmacy and SW  Financial Services: Medicare and Private Insurance: TRICARE  Choices offered to/list presented to:YES  Follow-up services arranged: Home Health: [facility], DME: [facility] and Patient/Family request agency HH: [facility], DME: NO PREF  Comments (or additional information):DAUGHTER WAS TALKED THROUGH PT'S CARE SINCE COULD NOT COME IN DUE TO ILLNESS AND WEATHER CONCERNS. AWARE PT WILL NEED 24/7 SUPERVISION. [facility]-MD IN [place] VERY INVOLVED AND MD SPOKE WITH NUMEROUS TIMES DURING PT'S STAY  Patient/Family verbalized understanding of follow-up arrangements: Yes  Individual responsible for coordination of the follow-up plan: [facility] [phone]  Confirmed correct DME delivered: Elease Hashimoto 11/13/2020    Mihira Tozzi, [place] Rhyme

## 2020-11-13 NOTE — Discharge Instructions (Signed)
Inpatient Rehab Discharge Instructions  Lisa [name] Discharge date and time: No discharge date for patient encounter.   Activities/Precautions/ Functional Status: Activity: activity as tolerated Diet: regular diet Wound Care: Routine skin checks Functional status:  ___ No restrictions     ___ Walk up steps independently ___ 24/7 supervision/assistance   ___ Walk up steps with assistance ___ Intermittent supervision/assistance  ___ Bathe/dress independently ___ Walk with walker     _x__ Bathe/dress with assistance ___ Walk Independently    ___ Shower independently ___ Walk with assistance    ___ Shower with assistance ___ No alcohol     ___ Return to work/school ________  Special Instructions:  No driving smoking or alcohol   COMMUNITY REFERRALS UPON DISCHARGE:    Home Health:   PT,OT AND AIDE                 [facility]                                          SUPPLIER: [facility]  [phone] GAVE GRANDSON PRIVATE DUTY LIST TO PURSUE ON OWN WITH MOTHER-PT'S DAUGHTER  My questions have been answered and I understand these instructions. I will adhere to these goals and the provided educational materials after my discharge from the hospital.  Patient/Caregiver Signature _______________________________ Date __________  Clinician Signature _______________________________________ Date __________  Please bring this form and your medication list with you to all your follow-up doctor's appointments.                        Food Safety Nutrition Therapy  This nutrition therapy addresses the food safety concerns of individuals whose immunity is suppressed and who are at high risk for foodborne illness.  This handout offers guidelines for which foods to eat and which foods to avoid to lower your risk of foodborne illness, tips for how to handle fresh fruits and vegetables,  and proper cooking temperatures to keep your food safe to eat.  Variations among institutions and physicians may occur, as well as updates with new food safety information.  Cooking Foods to Proper Temperatures  Proper cooking temperatures kill harmful bacteria present in food. Use a meat thermometer to check when meat, poultry, seafood, and dishes containing eggs are done cooking. The USDA-FDA recommends safe minimum internal temperatures for numerous foods, which are listed below.  . Beef, veal, pork, lamb: 145F (allow to rest 3 minutes before carving or eating)  . Poultry: 165F  . Ground beef, veal, pork, lamb: 160F  . Ground poultry: 165F  . Casseroles, egg dishes: 160F  . Fin fish: 145F or until opaque flesh flakes with a fork  . Scallops: cook until flesh is milky white or opaque and firm  . Shrimp, lobster, and crab: cook until they turn red and the flesh is pearly and opaque  . Clams, oysters, and mussels: cook until shells open during cooking  . Leftovers: reheat to at least 165F  . Deli-style meats and hot dogs: reheat until steaming hot or 165F  . Soups, gravies, and sauces: bring to a boil  . Meat marinade: discard marinade or boil for several minutes if you plan to reuse it  . Eggs: make sure the yolks and whites are firm, not runny, unless using pasteurized eggs  Handling  Produce (Fresh Fruits and Vegetables):  Fresh Fruits and Vegetables . Rinse surface dirt off raw fruits and vegetables.  . Soak raw fruits and vegetables, including those with skins or rinds that will be removed, in water for 2 minutes. Thoroughly rinse fruits and vegetables under running water before eating, peeling, or slicing. Do not use soap, detergents, or bleach solutions.  . Use a small vegetable brush to remove remaining surface dirt. Sanitize the brush between uses.  . Cut away damaged or bruised areas. Bacteria can thrive in these places.  . At the store, buy produce that is not bruised  or damaged. If buying fresh already cut produce, be sure it is refrigerated or surrounded by ice.  . At home, chill and refrigerate foods. After purchase, put produce that needs refrigeration away promptly. (Fresh whole produce such as bananas and potatoes do not need refrigeration.) Fresh produce should be refrigerated within 2 hours of peeling or cutting. Leftover cut produce should be discarded if left at room temperature for more than 2 hours.  . Don't cross-contaminate. Use clean cutting boards and utensils when handling fresh produce. If possible, use one clean cutting board for fresh produce and a separate one for raw meat, poultry, and seafood.  . During food preparation, wash cutting boards, utensils, or dishes that have come into contact with fresh produce, raw meat, poultry, or seafood.  . Do not consume ice that has come into contact with fresh produce or other raw products.  . Use a cooler with ice or use ice gel packs when transporting or storing perishable food outdoors, including cut fresh fruits and vegetables.  Raw Sprouts The FDA offers the following advice about sprouts: . Cook all sprouts thoroughly before eating to reduce the risk of illness.  . Sandwiches and salads purchased at restaurants and delicatessens often contain raw sprouts.  . When eating away from home, ask that raw sprouts not be added to your food.  . Homegrown sprouts also present a health risk, if eaten raw or lightly cooked, and should be avoided.   Meal Planning Tips . You may tolerate small, frequent meals more easily than larger meals.  . Choose high-calorie foods if you can't eat much. Good choices may include:  . Breaded meats  . Vegetables with sauces and fruits and vegetables with toppings or dips  . Starches such as rice, potatoes, and pasta  . Whole milk products and cheese . If you experience diarrhea, bloating, gas, or stomach pain after having milk or dairy foods, talk with your doctor or  registered dietitian nutritionist (RDN). You may need to avoid these foods or take lactase enzyme supplements.  . If you are losing weight because you cannot eat enough, talk to your doctor or RDN. Commercial supplements may be helpful, particularly between meals.  Food Safety Resources  The [place] Department of Agriculture Food Safety and Inspection Service and the [place] Department of Health and [employer]' Food and Drug Administration provide many resources on food safety topics. This information can be found at www.foodsafety.gov and WealthBoat.gl.  Food Group Foods Allowed  Dairy All pasteurized milk and milk products Commercially packaged cheese and cheese products made with pasteurized milk, such as mild and medium cheddar, mozzarella, parmesan, and swiss Pasteurized yogurt Dry, refrigerated, and frozen pasteurized whipped topping Ice cream, frozen yogurt, sherbet, ice cream bars, homemade milkshakes Commercial medical nutrition supplements (liquid and powdered) Commercial eggnog  Protein Foods All well-cooked meat (beef, pork, lamb, ham, bacon, sausage), poultry,  and fish and shellfish (see list of proper cooking temperatures) Canned meat, fish, meat spreads, and pates Well-cooked eggs and egg substitutes Reheated deli meats, lunch meats, and hot dogs (heat until steaming hot) Well-cooked tofu  Entres and Soups All cooked entres and soups Miso, cooked, store bought  Fruits and Nuts Canned and frozen fruit and pasteurized fruit juices Well-washed raw fruit; foods containing well-washed raw fruits Dried fruits Canned or bottled roasted nuts Nuts in baked products Shelled roasted nuts Commercially packaged peanut butter  Vegetables All frozen, canned, or fresh vegetables and cooked potatoes Well-washed raw vegetables Fresh, well-washed herbs and dried herbs and spices  Grains All breads, bagels, rolls, muffins, pancakes, sweet rolls, waffles, french toast Potato, corn,  and tortilla chips; pretzels; popcorn Cooked pasta, rice, and other grains All cereals, cooked and ready-to-eat  Beverages Tap water and ice made from tap water Commercially bottled distilled, spring, and natural waters All canned, bottled, powdered beverages Instant and brewed coffee and tea; cold-brewed tea made with boiling water Brewed herbal teas using commercially packaged tea bags Commercial nutrition supplements, liquid and powdered  Desserts Refrigerated commercial and homemade cakes, pies, pastries, and pudding Refrigerated, cream-filled pastries Homemade and commercial cookies Shelf-stable cream-filled cupcakes (such as Ding Dongs), fruit pies (such as Poptarts or Hostess fruit pies), and canned pudding Ices, popsicle-like products  Fats Oil and shortening Refrigerated lard, margarine, butter Commercial, shelf-stable mayonnaise and salad dressings (refrigerate after opening) Cooked gravy and sauces  Other Salt, granulated sugar, brown sugar Jam, jelly, syrup Commercial (heat-treated) honey Ketchup, mustard, barbecue sauce, soy sauce, other condiments Pickles, pickle relish, olives  Candy, gum    Food Group Foods Not Recommended  Dairy Unpasteurized or raw milk, yogurt, and other milk products Cheeses made from unpasteurized or raw milk Cheeses containing chili peppers or other uncooked vegetables Cheeses with molds such as blue, Stilton, Roquefort, and gorgonzola Soft cheeses such as brie, camembert, feta cheese, and farmer's cheese Mexican-style soft cheeses, such as queso blanco, queso fresco, and Panela  Protein Foods Raw, rare, or undercooked meat, poultry, fish, or game Raw tofu Raw or undercooked eggs (including runny, soft-cooked, or poached) and egg substitutes Smoked or pickled salmon or other fish Tempeh products Sliced meats from the deli and hot dogs, unless reheated until steaming hot Refrigerated pats and meat spreads  Entrees  and Soups Miso, raw, uncooked  Fruits and Nuts Unwashed raw fruits Unroasted raw nuts Roasted nuts in the shell Unpasteurized fruit and vegetable juices and cider  Vegetables Unwashed raw vegetables or herbs All raw vegetable sprouts, includingalfalfa, radish, broccoli, and mung bean Salads from delicatessens or salad bars Commercial salsas stored in refrigerated case  Grains Uncooked grain products Breads, rolls, and pastries in self-service bins  Beverages Well water (unless tested yearly and found to be free of coliforms) Cold-brewed tea made with warm or cold water Unpasteurized fruit and vegetable juices, cider, and beer Mat tea  Desserts Unrefrigerated, cream-filled pastry products  Fats Refrigerated fresh salad dressings containing aged cheese (for example, blue or Roquefort) or raw eggs  Other Raw, unpasteurized, or non-heat-treated honey; honey in the comb Herbal and nutrient supplement preparations Brewer's yeast, if uncooked Foods from shared bins in grocery stores Foods from street vendors

## 2020-11-13 NOTE — Progress Notes (Signed)
Patient ID: Lisa [name], female   DOB: 12-Apr-1934, 87 y.o.   MRN: [id number] Texted grandson per MD pt does not have AML and will need to go to regular oncologist appointment.

## 2020-11-14 ENCOUNTER — Encounter (HOSPITAL_COMMUNITY): Payer: Self-pay | Admitting: Hematology

## 2020-11-14 LAB — CBC WITH DIFFERENTIAL/PLATELET
Abs Immature Granulocytes: 0.01 10*3/uL (ref 0.00–0.07)
Basophils Absolute: 0 10*3/uL (ref 0.0–0.1)
Basophils Relative: 0 %
Eosinophils Absolute: 0 10*3/uL (ref 0.0–0.5)
Eosinophils Relative: 0 %
HCT: 29 % — ABNORMAL LOW (ref 36.0–46.0)
Hemoglobin: 8.3 g/dL — ABNORMAL LOW (ref 12.0–15.0)
Immature Granulocytes: 1 %
Lymphocytes Relative: 55 %
Lymphs Abs: 1.1 10*3/uL (ref 0.7–4.0)
MCH: 29.7 pg (ref 26.0–34.0)
MCHC: 28.6 g/dL — ABNORMAL LOW (ref 30.0–36.0)
MCV: 103.9 fL — ABNORMAL HIGH (ref 80.0–100.0)
Monocytes Absolute: 0.5 10*3/uL (ref 0.1–1.0)
Monocytes Relative: 24 %
Neutro Abs: 0.4 10*3/uL — CL (ref 1.7–7.7)
Neutrophils Relative %: 20 %
Platelets: 75 10*3/uL — ABNORMAL LOW (ref 150–400)
RBC: 2.79 MIL/uL — ABNORMAL LOW (ref 3.87–5.11)
RDW: 21.9 % — ABNORMAL HIGH (ref 11.5–15.5)
WBC: 2.1 10*3/uL — ABNORMAL LOW (ref 4.0–10.5)
nRBC: 6.3 % — ABNORMAL HIGH (ref 0.0–0.2)

## 2020-11-15 LAB — SURGICAL PATHOLOGY

## 2020-11-15 NOTE — Plan of Care (Signed)
  Problem: RH Balance Goal: LTG: Patient will maintain dynamic sitting balance (OT) Description: LTG:  Patient will maintain dynamic sitting balance with assistance during activities of daily living (OT) Outcome: Completed/Met Goal: LTG Patient will maintain dynamic standing with ADLs (OT) Description: LTG:  Patient will maintain dynamic standing balance with assist during activities of daily living (OT)  Outcome: Completed/Met   Problem: Sit to Stand Goal: LTG:  Patient will perform sit to stand in prep for activites of daily living with assistance level (OT) Description: LTG:  Patient will perform sit to stand in prep for activites of daily living with assistance level (OT) Outcome: Completed/Met   Problem: RH Grooming Goal: LTG Patient will perform grooming w/assist,cues/equip (OT) Description: LTG: Patient will perform grooming with assist, with/without cues using equipment (OT) Outcome: Completed/Met   Problem: RH Bathing Goal: LTG Patient will bathe all body parts with assist levels (OT) Description: LTG: Patient will bathe all body parts with assist levels (OT) Outcome: Completed/Met   Problem: RH Dressing Goal: LTG Patient will perform upper body dressing (OT) Description: LTG Patient will perform upper body dressing with assist, with/without cues (OT). Outcome: Completed/Met Goal: LTG Patient will perform lower body dressing w/assist (OT) Description: LTG: Patient will perform lower body dressing with assist, with/without cues in positioning using equipment (OT) Outcome: Completed/Met   Problem: RH Toileting Goal: LTG Patient will perform toileting task (3/3 steps) with assistance level (OT) Description: LTG: Patient will perform toileting task (3/3 steps) with assistance level (OT)  Outcome: Completed/Met   Problem: RH Toilet Transfers Goal: LTG Patient will perform toilet transfers w/assist (OT) Description: LTG: Patient will perform toilet transfers with assist,  with/without cues using equipment (OT) Outcome: Completed/Met

## 2020-11-15 NOTE — Progress Notes (Signed)
Occupational Therapy Discharge Summary  Patient Details  Name: Lisa [name] MRN: [id number] Date of Birth: September 18, 1934   Patient has met 10 of 10 long term goals due to improved activity tolerance, improved balance and postural control.  Patient to discharge at overall Supervision level.  Patient's care partner is independent to provide the necessary physical assistance at discharge.  Pt is overall Supervision for LB bathing at sink level (per her request - does not like full showers), LB dressing with occassional use of reacher, close supervision for walking to/from bathroom with RW and performing clothing management and hygiene in same manner. Sit to stands greatly improved with proper body mechanics. Family ed scheduled x2 but daughter unable to attend, performed education over the phone on 1/10.  Reasons goals not met: n/a  Recommendation:  Patient will benefit from ongoing skilled OT services in home health setting to continue to advance functional skills in the area of BADL and Reduce care partner burden.  Equipment: BSC and recommending TTB (pt declined to purchase at this time but is aware where to purchase later)  Reasons for discharge: treatment goals met and discharge from hospital  Patient/family agrees with progress made and goals achieved: Yes  OT Discharge Precautions/Restrictions  Precautions Precautions: Fall Restrictions Weight Bearing Restrictions: No Vital Signs Therapy Vitals Temp: 98.4 F (36.9 C) Pulse Rate: 85 Resp: 16 BP: (!) 105/43 Patient Position (if appropriate): Sitting Oxygen Therapy SpO2: 93 % O2 Device: Room Air Pain Pain Assessment Pain Scale: 0-10 Pain Score: 0-No pain Faces Pain Scale: Hurts a little bit Pain Type: Chronic pain Pain Location: Knee Pain Orientation: Right;Left Pain Descriptors / Indicators: Sore Pain Onset: On-going Pain Intervention(s): Repositioned ADL ADL Equipment Provided: Reacher,Long-handled  sponge Grooming: Setup Upper Body Bathing: Setup Where Assessed-Upper Body Bathing: Sitting at sink Lower Body Bathing: Supervision/safety Where Assessed-Lower Body Bathing: Sitting at sink,Standing at sink Upper Body Dressing: Setup Where Assessed-Upper Body Dressing: Sitting at sink Lower Body Dressing: Supervision/safety Where Assessed-Lower Body Dressing: Standing at sink,Sitting at sink Toileting: Supervision/safety Where Assessed-Toileting: Glass blower/designer: Close supervision Toilet Transfer Method: Counselling psychologist: Bedside commode Vision Baseline Vision/History: Wears glasses Wears Glasses: At all times Patient Visual Report: No change from baseline Vision Assessment?: No apparent visual deficits Perception  Perception: Within Functional Limits Praxis Praxis: Intact Cognition Overall Cognitive Status: Within Functional Limits for tasks assessed Arousal/Alertness: Awake/alert Orientation Level: Oriented X4 Memory: Appears intact Awareness: Appears intact Problem Solving: Appears intact Safety/Judgment: Appears intact Sensation Sensation Light Touch: Appears Intact Hot/Cold: Not tested Proprioception: Not tested Stereognosis: Not tested Coordination Gross Motor Movements are Fluid and Coordinated: No (coordinated and functional but slow movements) Fine Motor Movements are Fluid and Coordinated: No (mild tremor at times) Coordination and Movement Description: slowed and with resting tremor in hands Motor  Motor Motor: Within Functional Limits Motor - Discharge Observations: general weakness but improved, slowed coordination Mobility  Transfers Sit to Stand: Supervision/Verbal cueing Stand to Sit: Supervision/Verbal cueing  Trunk/Postural Assessment  Cervical Assessment Cervical Assessment: Within Functional Limits Thoracic Assessment Thoracic Assessment: Exceptions to [facility] Lumbar Assessment Lumbar Assessment: Exceptions to  [facility] Postural Control Postural Control: Within Functional Limits  Balance Balance Balance Assessed: Yes Standardized Balance Assessment Standardized Balance Assessment: Timed Up and Go Test Timed Up and Go Test TUG: Normal TUG Normal TUG (seconds): 56 (w/ walker and S) Static Sitting Balance Static Sitting - Balance Support: Feet supported Static Sitting - Level of Assistance: 7: Independent Dynamic Sitting Balance Dynamic Sitting - Balance Support:  Feet supported Dynamic Sitting - Level of Assistance: 6: Modified independent (Device/Increase time) Dynamic Sitting Balance - Compensations: occasional hand support for seated ADL Dynamic Sitting - Balance Activities: Forward lean/weight shifting;Lateral lean/weight shifting;Reaching for objects Static Standing Balance Static Standing - Balance Support: Right upper extremity supported;During functional activity Static Standing - Level of Assistance: 5: Stand by assistance Dynamic Standing Balance Dynamic Standing - Balance Support: During functional activity;Left upper extremity supported Dynamic Standing - Level of Assistance: 5: Stand by assistance Dynamic Standing - Balance Activities: Lateral lean/weight shifting;Reaching for objects Extremity/Trunk Assessment RUE Assessment RUE Assessment: Within Functional Limits LUE Assessment LUE Assessment: Within Functional Limits

## 2020-11-19 ENCOUNTER — Inpatient Hospital Stay (HOSPITAL_BASED_OUTPATIENT_CLINIC_OR_DEPARTMENT_OTHER): Payer: Medicare Other | Admitting: Hematology

## 2020-11-19 ENCOUNTER — Inpatient Hospital Stay (HOSPITAL_COMMUNITY): Payer: Medicare Other

## 2020-11-19 ENCOUNTER — Other Ambulatory Visit: Payer: Self-pay

## 2020-11-19 VITALS — BP 142/62 | HR 94 | Temp 97.3°F | Resp 22 | Wt 196.5 lb

## 2020-11-19 DIAGNOSIS — Z8719 Personal history of other diseases of the digestive system: Secondary | ICD-10-CM | POA: Diagnosis not present

## 2020-11-19 DIAGNOSIS — D61818 Other pancytopenia: Secondary | ICD-10-CM | POA: Diagnosis not present

## 2020-11-19 DIAGNOSIS — Z87891 Personal history of nicotine dependence: Secondary | ICD-10-CM | POA: Diagnosis not present

## 2020-11-19 DIAGNOSIS — C50912 Malignant neoplasm of unspecified site of left female breast: Secondary | ICD-10-CM | POA: Diagnosis not present

## 2020-11-19 DIAGNOSIS — I513 Intracardiac thrombosis, not elsewhere classified: Secondary | ICD-10-CM | POA: Diagnosis not present

## 2020-11-19 DIAGNOSIS — Z79899 Other long term (current) drug therapy: Secondary | ICD-10-CM | POA: Diagnosis not present

## 2020-11-19 DIAGNOSIS — G629 Polyneuropathy, unspecified: Secondary | ICD-10-CM | POA: Diagnosis not present

## 2020-11-19 DIAGNOSIS — D469 Myelodysplastic syndrome, unspecified: Secondary | ICD-10-CM | POA: Diagnosis not present

## 2020-11-19 DIAGNOSIS — C3412 Malignant neoplasm of upper lobe, left bronchus or lung: Secondary | ICD-10-CM | POA: Diagnosis not present

## 2020-11-19 DIAGNOSIS — I700 Atherosclerosis of aorta: Secondary | ICD-10-CM | POA: Diagnosis not present

## 2020-11-19 DIAGNOSIS — E871 Hypo-osmolality and hyponatremia: Secondary | ICD-10-CM | POA: Diagnosis not present

## 2020-11-19 DIAGNOSIS — Z888 Allergy status to other drugs, medicaments and biological substances status: Secondary | ICD-10-CM | POA: Diagnosis not present

## 2020-11-19 DIAGNOSIS — K5900 Constipation, unspecified: Secondary | ICD-10-CM | POA: Diagnosis not present

## 2020-11-19 DIAGNOSIS — R5383 Other fatigue: Secondary | ICD-10-CM | POA: Diagnosis not present

## 2020-11-19 DIAGNOSIS — Z809 Family history of malignant neoplasm, unspecified: Secondary | ICD-10-CM | POA: Diagnosis not present

## 2020-11-19 DIAGNOSIS — Z853 Personal history of malignant neoplasm of breast: Secondary | ICD-10-CM | POA: Diagnosis not present

## 2020-11-19 DIAGNOSIS — Z923 Personal history of irradiation: Secondary | ICD-10-CM | POA: Diagnosis not present

## 2020-11-19 DIAGNOSIS — J439 Emphysema, unspecified: Secondary | ICD-10-CM | POA: Diagnosis not present

## 2020-11-19 LAB — IRON AND TIBC
Iron: 22 ug/dL — ABNORMAL LOW (ref 28–170)
Saturation Ratios: 7 % — ABNORMAL LOW (ref 10.4–31.8)
TIBC: 296 ug/dL (ref 250–450)
UIBC: 274 ug/dL

## 2020-11-19 LAB — COMPREHENSIVE METABOLIC PANEL
ALT: 11 U/L (ref 0–44)
AST: 20 U/L (ref 15–41)
Albumin: 3.3 g/dL — ABNORMAL LOW (ref 3.5–5.0)
Alkaline Phosphatase: 46 U/L (ref 38–126)
Anion gap: 8 (ref 5–15)
BUN: 9 mg/dL (ref 8–23)
CO2: 27 mmol/L (ref 22–32)
Calcium: 9 mg/dL (ref 8.9–10.3)
Chloride: 96 mmol/L — ABNORMAL LOW (ref 98–111)
Creatinine, Ser: 0.91 mg/dL (ref 0.44–1.00)
GFR, Estimated: >60 mL/min (ref >60)
Glucose, Bld: 97 mg/dL (ref 70–99)
Potassium: 4 mmol/L (ref 3.5–5.1)
Sodium: 131 mmol/L — ABNORMAL LOW (ref 135–145)
Total Bilirubin: 1 mg/dL (ref 0.3–1.2)
Total Protein: 8.4 g/dL — ABNORMAL HIGH (ref 6.5–8.1)

## 2020-11-19 LAB — RETICULOCYTES
Immature Retic Fract: 23.2 % — ABNORMAL HIGH (ref 2.3–15.9)
RBC.: 3.34 MIL/uL — ABNORMAL LOW (ref 3.87–5.11)
Retic Count, Absolute: 147.6 10*3/uL (ref 19.0–186.0)
Retic Ct Pct: 4.4 % — ABNORMAL HIGH (ref 0.4–3.1)

## 2020-11-19 LAB — LACTATE DEHYDROGENASE: LDH: 226 U/L — ABNORMAL HIGH (ref 98–192)

## 2020-11-19 LAB — FERRITIN: Ferritin: 500 ng/mL — ABNORMAL HIGH (ref 11–307)

## 2020-11-19 NOTE — Patient Instructions (Signed)
[place] at [facility] Discharge Instructions  You were seen today by Dr. Delton Coombes. He went over your recent results; your bone marrow biopsy showed myelodysplastic syndrome (MDS). You had additional labs drawn today for further analysis. Start eating protein-dense meals or drinking 2-3 cans of High-Protein Boost/Ensure daily to improve your blood albumin levels. Start your physical therapy sessions and perform your exercises daily to improve your strength and mobility. Dr. Delton Coombes will see you back in 3 weeks for labs and follow up.   Thank you for choosing [place] at [facility] to provide your oncology and hematology care.  To afford each patient quality time with our provider, please arrive at least 15 minutes before your scheduled appointment time.   If you have a lab appointment with the [facility] please come in thru the Main Entrance and check in at the main information desk  You need to re-schedule your appointment should you arrive 10 or more minutes late.  We strive to give you quality time with our providers, and arriving late affects you and other patients whose appointments are after yours.  Also, if you no show three or more times for appointments you may be dismissed from the clinic at the providers discretion.     Again, thank you for choosing [facility].  Our hope is that these requests will decrease the amount of time that you wait before being seen by our physicians.       _____________________________________________________________  Should you have questions after your visit to [facility], please contact our office at [phone] between the hours of 8:00 a.m. and 4:30 p.m.  Voicemails left after 4:00 p.m. will not be returned until the following business day.  For prescription refill requests, have your pharmacy contact our office and allow 72 hours.    Cancer Center Support Programs:    > Cancer Support Group  2nd Tuesday of the month 1pm-2pm, Journey Room

## 2020-11-19 NOTE — Progress Notes (Signed)
[place], [place] [address]   CLINIC:  Medical Oncology/Hematology  PCP:  Celene Squibb, MD [address] [place] [address]  [phone]  REASON FOR VISIT:  Follow-up for MDS and left breast cancer  PRIOR THERAPY: Lumpectomy and XRT in 1999, followed by tamoxifen for 5 years  CURRENT THERAPY: Under work-up  INTERVAL HISTORY:  Lisa [name], a 87 y.o. female, returns for routine follow-up for her MDS and left breast cancer. Marrah was last seen by Dr. Arletha Pili Iruku on 10/17/2020.  Today she is accompanied by her daughter and she reports feeling okay. She was hospitalized twice in December due to a progressive decline and hyponatremia. She was noted to be walking in November, but now uses a walker to ambulate; she is mainly sedentary at home, sitting and sleeping in her recliner. Her appetite is good and she can feed herself, but she is not able to prepare her food.  She is currently at home now with her daughter; she needs help with her ADL's. Home rehab is supposed to start on 1/20.   REVIEW OF SYSTEMS:  Review of Systems  Constitutional: Positive for fatigue (75%). Negative for appetite change.  Gastrointestinal: Positive for constipation.  All other systems reviewed and are negative.   PAST MEDICAL/SURGICAL HISTORY:  Past Medical History:  Diagnosis Date  . Adenocarcinoma of left lung ([facility]) 2006  . Arthritis   . Asthma   . Cancer of breast, female ([facility])   . Cancer of lung ([facility])   . Hypertension   . Hypothyroidism   . Invasive ductal carcinoma of left breast ([facility]) 1999  . Neutropenia ([facility]) 06/09/2016  . Personal history of radiation therapy    Past Surgical History:  Procedure Laterality Date  . BIOPSY  10/23/2020   Procedure: BIOPSY;  Surgeon: Yetta Flock, MD;  Location: [facility] ENDOSCOPY;  Service: Gastroenterology;;  . BREAST BIOPSY     left axillary node dissection  . BREAST LUMPECTOMY Left   .  COLONOSCOPY  03/08/2003   KPT:WSFKCLEXNT rectal polyps destroyed with the tip of the snare/Polyps at hepatic flexure, splenic flexure at 35 cm/Left-sided diverticula: unable to retrieve path  . COLONOSCOPY  09/12/2008   ZGY:FVCBSW rectum and distal sigmoid diminutive polyps/scattered left sided diverticulum. hyperplastic  . COLONOSCOPY N/A 03/06/2013   HQP:RFFMBWG polyp-removed as described above; colonic diverticulosis. hyperplastic polyps. next TCS 03/2018  . ESOPHAGOGASTRODUODENOSCOPY (EGD) WITH PROPOFOL N/A 10/23/2020   Procedure: ESOPHAGOGASTRODUODENOSCOPY (EGD) WITH PROPOFOL;  Surgeon: Yetta Flock, MD;  Location: [facility];  Service: Gastroenterology;  Laterality: N/A;  . FOOT SURGERY    . LUNG REMOVAL, PARTIAL     upper lobe    SOCIAL HISTORY:  Social History   Socioeconomic History  . Marital status: Widowed    Spouse name: Not on file  . Number of children: Not on file  . Years of education: Not on file  . Highest education level: Not on file  Occupational History  . Not on file  Tobacco Use  . Smoking status: Former Smoker    Packs/day: 0.75    Years: 35.00    Pack years: 26.25    Quit date: 1993    Years since quitting: 29.0  . Smokeless tobacco: Never Used  . Tobacco comment: smoke-free X 30 yeras  Vaping Use  . Vaping Use: Never used  Substance and Sexual Activity  . Alcohol use: No  . Drug use: No  .  Sexual activity: Not Currently  Other Topics Concern  . Not on file  Social History Narrative  . Not on file   Social Determinants of Health   Financial Resource Strain: Not on file  Food Insecurity: Not on file  Transportation Needs: Not on file  Physical Activity: Not on file  Stress: Not on file  Social Connections: Not on file  Intimate Partner Violence: Not on file    FAMILY HISTORY:  Family History  Problem Relation Age of Onset  . Cancer Mother   . Cancer Sister   . Colon cancer Neg Hx     CURRENT MEDICATIONS:  Current  Outpatient Medications  Medication Sig Dispense Refill  . acetaminophen (TYLENOL) 325 MG tablet Take 2 tablets (650 mg total) by mouth every 6 (six) hours.    Marland Kitchen albuterol (PROVENTIL,VENTOLIN) 90 MCG/ACT inhaler Inhale 2 puffs into the lungs every 6 (six) hours as needed for wheezing or shortness of breath. 17 g 12  . allopurinol (ZYLOPRIM) 100 MG tablet Take 2 tablets (200 mg total) by mouth daily. 60 tablet 0  . diclofenac Sodium (VOLTAREN) 1 % GEL Apply 4 g topically 4 (four) times daily. 4 g 0  . ferrous sulfate 325 (65 FE) MG tablet Take 1 tablet (325 mg total) by mouth daily with breakfast. 30 tablet 3  . fluticasone furoate-vilanterol (BREO ELLIPTA) 100-25 MCG/INH AEPB Inhale 1 puff into the lungs daily. 1 each 1  . furosemide (LASIX) 20 MG tablet Take 0.5 tablets (10 mg total) by mouth daily. 30 tablet 0  . Iron-FA-B Cmp-C-Biot-Probiotic (FUSION PLUS) CAPS Take 1 capsule by mouth daily.    Marland Kitchen levothyroxine (SYNTHROID) 100 MCG tablet Take 1 tablet (100 mcg total) by mouth daily at 6 (six) AM. 90 tablet 0  . magnesium oxide (MAG-OX) 400 (241.3 Mg) MG tablet Take 2 tablets (800 mg total) by mouth 2 (two) times daily. 60 tablet 0  . metoprolol tartrate (LOPRESSOR) 25 MG tablet Take 0.5 tablets (12.5 mg total) by mouth 2 (two) times daily. 90 tablet 0  . Multiple Vitamin (MULTIVITAMIN WITH MINERALS) TABS tablet Take 1 tablet by mouth daily.    . pantoprazole (PROTONIX) 40 MG tablet Take 1 tablet (40 mg total) by mouth daily. 30 tablet 0  . polyethylene glycol (MIRALAX / GLYCOLAX) 17 g packet Take 17 g by mouth daily as needed for moderate constipation. 14 each 0  . senna-docusate (SENOKOT-S) 8.6-50 MG tablet Take 1 tablet by mouth at bedtime.    . traMADol (ULTRAM) 50 MG tablet Take 1 tablet (50 mg total) by mouth every 6 (six) hours as needed for moderate pain. 30 tablet 0  . traZODone (DESYREL) 50 MG tablet Take 1 tablet (50 mg total) by mouth at bedtime. 30 tablet 0  . TRELEGY ELLIPTA  100-62.5-25 MCG/INH AEPB Inhale 1 puff into the lungs daily.     Marland Kitchen umeclidinium bromide (INCRUSE ELLIPTA) 62.5 MCG/INH AEPB Inhale 1 puff into the lungs daily. 1 each 1   No current facility-administered medications for this visit.    ALLERGIES:  Allergies  Allergen Reactions  . Meloxicam Other (See Comments)    Caused an injury to the kidneys, per nephrologist  . Micardis Hct [Telmisartan-Hctz] Other (See Comments)    Caused an injury to the kidneys, per nephrologist    PHYSICAL EXAM:  Performance status (ECOG): 1 - Symptomatic but completely ambulatory  Vitals:   11/19/20 1421  BP: (!) 142/62  Pulse: 94  Resp: (!) 22  Temp: (!)  97.3 F (36.3 C)  SpO2: 92%   Wt Readings from Last 3 Encounters:  11/19/20 196 lb 8 oz (89.1 kg)  10/30/20 205 lb 7.5 oz (93.2 kg)  10/25/20 195 lb 12.3 oz (88.8 kg)   Physical Exam Vitals reviewed.  Constitutional:      Appearance: Normal appearance. She is obese.     Comments: In wheelchair  Cardiovascular:     Rate and Rhythm: Normal rate and regular rhythm.     Pulses: Normal pulses.     Heart sounds: Normal heart sounds.  Pulmonary:     Effort: Pulmonary effort is normal.     Breath sounds: Normal breath sounds.  Chest:  Breasts:     Right: No axillary adenopathy.     Left: No axillary adenopathy.    Musculoskeletal:     Right lower leg: No edema.     Left lower leg: No edema.  Lymphadenopathy:     Upper Body:     Right upper body: No axillary or pectoral adenopathy.     Left upper body: No axillary or pectoral adenopathy.  Neurological:     General: No focal deficit present.     Mental Status: She is alert and oriented to person, place, and time.  Psychiatric:        Mood and Affect: Mood normal.        Behavior: Behavior normal.     LABORATORY DATA:  I have reviewed the labs as listed.  CBC Latest Ref Rng & Units 11/13/2020 11/11/2020 11/09/2020  WBC 4.0 - 10.5 K/uL 2.1(L) 2.5(L) 2.7(L)  Hemoglobin 12.0 - 15.0 g/dL  8.3(L) 8.2(L) 8.7(L)  Hematocrit 36.0 - 46.0 % 29.0(L) 28.7(L) 29.0(L)  Platelets 150 - 400 K/uL 75(L) 70(L) 68(L)   CMP Latest Ref Rng & Units 11/13/2020 11/11/2020 11/09/2020  Glucose 70 - 99 mg/dL 92 91 91  BUN 8 - 23 mg/dL 5(L) 7(L) 6(L)  Creatinine 0.44 - 1.00 mg/dL 0.86 0.83 0.99  Sodium 135 - 145 mmol/L 131(L) 134(L) 133(L)  Potassium 3.5 - 5.1 mmol/L 4.5 4.7 4.6  Chloride 98 - 111 mmol/L 94(L) 98 99  CO2 22 - 32 mmol/L '28 28 25  ' Calcium 8.9 - 10.3 mg/dL 9.2 9.1 8.6(L)  Total Protein 6.5 - 8.1 g/dL - 7.2 -  Total Bilirubin 0.3 - 1.2 mg/dL - 0.7 -  Alkaline Phos 38 - 126 U/L - 45 -  AST 15 - 41 U/L - 21 -  ALT 0 - 44 U/L - 15 -      Component Value Date/Time   RBC 2.79 (L) 11/13/2020 0629   MCV 103.9 (H) 11/13/2020 0629   MCH 29.7 11/13/2020 0629   MCHC 28.6 (L) 11/13/2020 0629   RDW 21.9 (H) 11/13/2020 0629   LYMPHSABS 1.1 11/13/2020 0629   MONOABS 0.5 11/13/2020 0629   EOSABS 0.0 11/13/2020 0629   BASOSABS 0.0 11/13/2020 0629   Surgical pathology ([id number]) on 11/06/2020: Bone marrow aspirate: hypercellular marrow with trilineage dyspoiesis; polytypic plasmacytosis.  DIAGNOSTIC IMAGING:  I have independently reviewed the scans and discussed with the patient. DG Chest 2 View  Result Date: 10/26/2020 CLINICAL DATA:  Shortness of breath EXAM: CHEST - 2 VIEW COMPARISON:  10/26/2020, 10:59 a.m. FINDINGS: Mild cardiomegaly. Unchanged small bilateral pleural effusions. The visualized skeletal structures are unremarkable. IMPRESSION: Mild cardiomegaly with unchanged small bilateral pleural effusions. No new airspace opacity. Electronically Signed   By: Eddie Candle M.D.   On: 10/26/2020 12:23   CT Head Wo  Contrast  Result Date: 10/21/2020 CLINICAL DATA:  Altered level of consciousness EXAM: CT HEAD WITHOUT CONTRAST TECHNIQUE: Contiguous axial images were obtained from the base of the skull through the vertex without intravenous contrast. COMPARISON:  06/04/2005 FINDINGS:  Brain: There are confluent hypodensities throughout the periventricular white matter, more pronounced since prior study, consistent with age-indeterminate small vessel ischemic changes. These are likely chronic. No other signs of acute infarct or hemorrhage. Lateral ventricles and remaining midline structures are unremarkable. No acute extra-axial fluid collections. No mass effect. Vascular: No hyperdense vessel or unexpected calcification. Skull: Normal. Negative for fracture or focal lesion. Sinuses/Orbits: Minimal mucosal thickening within the sphenoid sinus. Remaining sinuses are clear. Other: None. IMPRESSION: 1. Diffuse hypodensities throughout the periventricular white matter, likely representing chronic small vessel ischemic changes. 2. Otherwise no acute intracranial process. Electronically Signed   By: Randa Ngo M.D.   On: 10/21/2020 15:01   CT CHEST ABDOMEN PELVIS W CONTRAST  Result Date: 10/24/2020 CLINICAL DATA:  Poor appetite, personal history of breast cancer and lung cancer EXAM: CT CHEST, ABDOMEN, AND PELVIS WITH CONTRAST TECHNIQUE: Multidetector CT imaging of the chest, abdomen and pelvis was performed following the standard protocol during bolus administration of intravenous contrast. CONTRAST:  127m OMNIPAQUE IOHEXOL 300 MG/ML  SOLN COMPARISON:  06/13/2019 FINDINGS: CT CHEST FINDINGS Cardiovascular: The heart is mildly enlarged without pericardial effusion. Extensive atherosclerosis throughout the coronary vasculature. The aorta is normal in caliber. There is extensive atherosclerosis of the aortic arch, with moderate stenosis of the origin of the left subclavian artery. Mediastinum/Nodes: Stable subcentimeter left hilar lymph nodes. No pathologic adenopathy. Thyroid, trachea, and esophagus are unremarkable. Lungs/Pleura: Stable postsurgical changes from partial left upper lobe resection. Upper lobe predominant emphysema. Bibasilar scarring. No airspace disease, effusion, or  pneumothorax. Musculoskeletal: No acute or destructive bony lesions. Reconstructed images demonstrate no additional findings. CT ABDOMEN PELVIS FINDINGS Hepatobiliary: No focal liver abnormality is seen. No gallstones, gallbladder wall thickening, or biliary dilatation. Pancreas: Unremarkable. No pancreatic ductal dilatation or surrounding inflammatory changes. Spleen: Normal in size without focal abnormality. Adrenals/Urinary Tract: Adrenal glands are unremarkable. Kidneys are normal, without renal calculi, focal lesion, or hydronephrosis. Bladder is unremarkable. Stomach/Bowel: No bowel obstruction or ileus. Diverticulosis of the sigmoid colon without diverticulitis. Normal appendix right lower quadrant. No bowel wall thickening or inflammatory change. Vascular/Lymphatic: Infrarenal abdominal aortic aneurysm measures up to 4.8 cm in diameter, previously having measured 4.1 cm on 05/31/2017 exam. Significant atherosclerosis and mural thrombus throughout the aorta. No pathologic adenopathy. Reproductive: Uterus and bilateral adnexa are unremarkable. Other: No free fluid or free gas. No abdominal wall hernia. Musculoskeletal: No acute or destructive bony lesions. Reconstructed images demonstrate no additional findings. IMPRESSION: 1. Stable postsurgical changes from partial left upper lobe resection. 2. Infrarenal abdominal aortic aneurysm measures up to 4.8 cm in diameter, previously having measured 4.1 cm on 05/31/2017 exam. Recommend follow-up CT/MR every 6 months and vascular consultation. This recommendation follows ACR consensus guidelines: White Paper of the ACR Incidental Findings Committee II on Vascular Findings. J Am Coll Radiol 2013; 10:789-794. 3. Diverticulosis without diverticulitis. 4. Aortic Atherosclerosis (ICD10-I70.0) and Emphysema (ICD10-J43.9). Electronically Signed   By: MRanda NgoM.D.   On: 10/24/2020 18:00   CT BONE MARROW BIOPSY  Result Date: 11/06/2020 INDICATION: 87year old female  with a history of pancytopenia EXAM: CT BIOPSY BONE MARROW MEDICATIONS: None. ANESTHESIA/SEDATION: No moderate sedation was employed during this procedure. A total of Versed 0 mg and Fentanyl 25 mcg was administered intravenously. No moderate sedation.  The patient's level of consciousness and vital signs were monitored continuously by radiology nursing throughout the procedure under my direct supervision. FLUOROSCOPY TIME:  CT COMPLICATIONS: None PROCEDURE: The procedure risks, benefits, and alternatives were explained to the patient. Questions regarding the procedure were encouraged and answered. The patient understands and consents to the procedure. Scout CT of the pelvis was performed for surgical planning purposes. The posterior pelvis was prepped with Chlorhexidine in a sterile fashion, and a sterile drape was applied covering the operative field. A sterile gown and sterile gloves were used for the procedure. Local anesthesia was provided with 1% Lidocaine. Posterior right iliac bone was targeted for biopsy. The skin and subcutaneous tissues were infiltrated with 1% lidocaine without epinephrine. A small stab incision was made with an 11 blade scalpel, and an 11 gauge Murphy needle was advanced with CT guidance to the posterior cortex. Manual forced was used to advance the needle through the posterior cortex and the stylet was removed. A bone marrow aspirate was retrieved and passed to a cytotechnologist in the room. The Murphy needle was then advanced without the stylet for a core biopsy. The core biopsy was retrieved and also passed to a cytotechnologist. Manual pressure was used for hemostasis and a sterile dressing was placed. No complications were encountered no significant blood loss was encountered. Patient tolerated the procedure well and remained hemodynamically stable throughout. IMPRESSION: Status post CT-guided bone marrow biopsy, with tissue specimen sent to pathology for complete histopathologic  analysis Signed, Dulcy Fanny. Earleen Newport, DO [facility] Electronically Signed   By: Corrie Mckusick D.O.   On: 11/06/2020 12:51      ASSESSMENT:  1.  MDS: -Initially evaluated at the request of Dr. [place] Crane for pancytopenia. Subsequently her white count has recovered along with recovery of the hemoglobin. -Bone marrow biopsy on 11/06/2020 shows hypercellular marrow with trilineage dyspoiesis and polytypic plasmacytosis.  Primary differential includes CMML and other MDS.  Definite to blast population is not identified morphologically or by flow.  There is a increased immature mononuclear cells favored to be monocytic in origin.  Megakaryocytes are frequently hypolobated and hyperchromatic without clustering.  Atypical appearing erythroid precursors.  Increased plasma cells 5 to 9% which are polytypic by kappa and lambda staining. - Chromosome analysis shows 46,X, idiocentric X chromosome[9]/46, XX[11]  2.  Stage I left breast cancer: -Diagnosed in Sep 1999, treated with lumpectomy and XRT. ER/PR was positive and received tamoxifen for 5 years. -Mammogram on 2019-10-30 was BI-RADS Category 2. Physical exam did not reveal any palpable masses today.  3.  Stage I adenocarcinoma of the left upper lobe of the lung: -Left upper lobectomy on 2005-06-01. CT chest on 2018-06-09 was stable. - CT CAP on 10/24/2020 shows stable postsurgical changes from the partial left upper lobe resection.  No other signs of malignancy.   PLAN:  1.  MDS: - I had a prolonged discussion with the patient, her daughter and grandson about findings on the bone marrow biopsy, consistent with MDS/CMML. - We will request FISH panel for MDS. - I did not find NGS panel on the EMR.  We will order NGS panel for myeloid neoplasms. - We will repeat her CBC and rule out nutritional deficiencies.  We will also check SPEP, methylmalonic acid, copper, ferritin and iron panel. - She is too weak  to initiate any therapy for MDS at this time.  She is at home but is dependent for day-to-day activities. - She will start  physical therapy at home tomorrow.  Hopefully she can get stronger.  She currently walks with the help of walker. - If her white count recovers, and her MDS is low-grade we can consider erythropoiesis stimulating agents. - We will also consider getting baseline serum erythropoietin. - If high-grade MDS, will consider azacitidine. - I have also reviewed recent CT CAP  Done during hospitalization which did not show any evidence of malignancy.  2.  Peripheral neuropathy: - Continue gabapentin 600 mg-600 mg - 900 mg.    Orders placed this encounter:  No orders of the defined types were placed in this encounter.  Total time spent is 40 minutes with more than 50% of the time spent face-to-face discussing and reviewing hospital records, bone marrow biopsy results, further plan, counseling and coordination of care.  Derek Jack, MD [place] [phone]   I, Milinda Antis, am acting as a scribe for Dr. Sanda Linger.  I, Derek Jack MD, have reviewed the above documentation for accuracy and completeness, and I agree with the above.

## 2020-11-19 NOTE — Progress Notes (Signed)
MDS FISH Panel requested per Dr. Delton Coombes

## 2020-11-20 ENCOUNTER — Encounter (HOSPITAL_COMMUNITY): Payer: Self-pay | Admitting: Hematology

## 2020-11-20 LAB — PROTEIN ELECTROPHORESIS, SERUM
A/G Ratio: 0.7 (ref 0.7–1.7)
Albumin ELP: 3.3 g/dL (ref 2.9–4.4)
Alpha-1-Globulin: 0.3 g/dL (ref 0.0–0.4)
Alpha-2-Globulin: 0.7 g/dL (ref 0.4–1.0)
Beta Globulin: 1.3 g/dL (ref 0.7–1.3)
Gamma Globulin: 2.6 g/dL — ABNORMAL HIGH (ref 0.4–1.8)
Globulin, Total: 5 g/dL — ABNORMAL HIGH (ref 2.2–3.9)
Total Protein ELP: 8.3 g/dL (ref 6.0–8.5)

## 2020-11-20 LAB — CBC WITH DIFFERENTIAL/PLATELET
Abs Immature Granulocytes: 0.06 10*3/uL (ref 0.00–0.07)
Basophils Absolute: 0 10*3/uL (ref 0.0–0.1)
Basophils Relative: 0 %
Eosinophils Absolute: 0 10*3/uL (ref 0.0–0.5)
Eosinophils Relative: 0 %
HCT: 34.5 % — ABNORMAL LOW (ref 36.0–46.0)
Hemoglobin: 9.9 g/dL — ABNORMAL LOW (ref 12.0–15.0)
Immature Granulocytes: 3 %
Lymphocytes Relative: 53 %
Lymphs Abs: 1.2 10*3/uL (ref 0.7–4.0)
MCH: 30.2 pg (ref 26.0–34.0)
MCHC: 28.7 g/dL — ABNORMAL LOW (ref 30.0–36.0)
MCV: 105.2 fL — ABNORMAL HIGH (ref 80.0–100.0)
Monocytes Absolute: 0.7 10*3/uL (ref 0.1–1.0)
Monocytes Relative: 30 %
Neutro Abs: 0.3 10*3/uL — CL (ref 1.7–7.7)
Neutrophils Relative %: 14 %
Platelets: 84 10*3/uL — ABNORMAL LOW (ref 150–400)
RBC: 3.28 MIL/uL — ABNORMAL LOW (ref 3.87–5.11)
RDW: 21 % — ABNORMAL HIGH (ref 11.5–15.5)
WBC: 2.2 10*3/uL — ABNORMAL LOW (ref 4.0–10.5)
nRBC: 5.5 % — ABNORMAL HIGH (ref 0.0–0.2)

## 2020-11-20 LAB — SURGICAL PATHOLOGY

## 2020-11-21 DIAGNOSIS — M10362 Gout due to renal impairment, left knee: Secondary | ICD-10-CM | POA: Diagnosis not present

## 2020-11-21 DIAGNOSIS — Z85118 Personal history of other malignant neoplasm of bronchus and lung: Secondary | ICD-10-CM | POA: Diagnosis not present

## 2020-11-21 DIAGNOSIS — Z853 Personal history of malignant neoplasm of breast: Secondary | ICD-10-CM | POA: Diagnosis not present

## 2020-11-21 DIAGNOSIS — E039 Hypothyroidism, unspecified: Secondary | ICD-10-CM | POA: Diagnosis not present

## 2020-11-21 DIAGNOSIS — M25561 Pain in right knee: Secondary | ICD-10-CM | POA: Diagnosis not present

## 2020-11-21 DIAGNOSIS — M25562 Pain in left knee: Secondary | ICD-10-CM | POA: Diagnosis not present

## 2020-11-21 DIAGNOSIS — I129 Hypertensive chronic kidney disease with stage 1 through stage 4 chronic kidney disease, or unspecified chronic kidney disease: Secondary | ICD-10-CM | POA: Diagnosis not present

## 2020-11-21 DIAGNOSIS — E669 Obesity, unspecified: Secondary | ICD-10-CM | POA: Diagnosis not present

## 2020-11-21 DIAGNOSIS — Z87891 Personal history of nicotine dependence: Secondary | ICD-10-CM | POA: Diagnosis not present

## 2020-11-21 DIAGNOSIS — B9681 Helicobacter pylori [H. pylori] as the cause of diseases classified elsewhere: Secondary | ICD-10-CM | POA: Diagnosis not present

## 2020-11-21 DIAGNOSIS — N183 Chronic kidney disease, stage 3 unspecified: Secondary | ICD-10-CM | POA: Diagnosis not present

## 2020-11-21 DIAGNOSIS — M10361 Gout due to renal impairment, right knee: Secondary | ICD-10-CM | POA: Diagnosis not present

## 2020-11-21 DIAGNOSIS — I482 Chronic atrial fibrillation, unspecified: Secondary | ICD-10-CM | POA: Diagnosis not present

## 2020-11-21 DIAGNOSIS — J45909 Unspecified asthma, uncomplicated: Secondary | ICD-10-CM | POA: Diagnosis not present

## 2020-11-21 DIAGNOSIS — D61818 Other pancytopenia: Secondary | ICD-10-CM | POA: Diagnosis not present

## 2020-11-21 DIAGNOSIS — D638 Anemia in other chronic diseases classified elsewhere: Secondary | ICD-10-CM | POA: Diagnosis not present

## 2020-11-21 LAB — METHYLMALONIC ACID, SERUM: Methylmalonic Acid, Quantitative: 218 nmol/L (ref 0–378)

## 2020-11-22 ENCOUNTER — Ambulatory Visit (HOSPITAL_COMMUNITY): Payer: Medicare Other

## 2020-11-26 ENCOUNTER — Encounter (HOSPITAL_COMMUNITY): Payer: Self-pay | Admitting: Hematology

## 2020-11-26 DIAGNOSIS — I129 Hypertensive chronic kidney disease with stage 1 through stage 4 chronic kidney disease, or unspecified chronic kidney disease: Secondary | ICD-10-CM | POA: Diagnosis not present

## 2020-11-26 DIAGNOSIS — D638 Anemia in other chronic diseases classified elsewhere: Secondary | ICD-10-CM | POA: Diagnosis not present

## 2020-11-26 DIAGNOSIS — M10361 Gout due to renal impairment, right knee: Secondary | ICD-10-CM | POA: Diagnosis not present

## 2020-11-26 DIAGNOSIS — N183 Chronic kidney disease, stage 3 unspecified: Secondary | ICD-10-CM | POA: Diagnosis not present

## 2020-11-26 DIAGNOSIS — D61818 Other pancytopenia: Secondary | ICD-10-CM | POA: Diagnosis not present

## 2020-11-26 DIAGNOSIS — I482 Chronic atrial fibrillation, unspecified: Secondary | ICD-10-CM | POA: Diagnosis not present

## 2020-11-28 DIAGNOSIS — I482 Chronic atrial fibrillation, unspecified: Secondary | ICD-10-CM | POA: Diagnosis not present

## 2020-11-28 DIAGNOSIS — D61818 Other pancytopenia: Secondary | ICD-10-CM | POA: Diagnosis not present

## 2020-11-28 DIAGNOSIS — N183 Chronic kidney disease, stage 3 unspecified: Secondary | ICD-10-CM | POA: Diagnosis not present

## 2020-11-28 DIAGNOSIS — I129 Hypertensive chronic kidney disease with stage 1 through stage 4 chronic kidney disease, or unspecified chronic kidney disease: Secondary | ICD-10-CM | POA: Diagnosis not present

## 2020-11-28 DIAGNOSIS — M10361 Gout due to renal impairment, right knee: Secondary | ICD-10-CM | POA: Diagnosis not present

## 2020-11-28 DIAGNOSIS — D638 Anemia in other chronic diseases classified elsewhere: Secondary | ICD-10-CM | POA: Diagnosis not present

## 2020-11-29 ENCOUNTER — Other Ambulatory Visit (HOSPITAL_COMMUNITY)
Admission: RE | Admit: 2020-11-29 | Discharge: 2020-11-29 | Disposition: A | Discharge status: Home / Self Care | Payer: Medicare Other | Source: Ambulatory Visit | Attending: Nephrology | Admitting: Nephrology

## 2020-11-29 ENCOUNTER — Other Ambulatory Visit: Payer: Self-pay

## 2020-11-29 DIAGNOSIS — N170 Acute kidney failure with tubular necrosis: Secondary | ICD-10-CM | POA: Diagnosis not present

## 2020-11-29 DIAGNOSIS — I129 Hypertensive chronic kidney disease with stage 1 through stage 4 chronic kidney disease, or unspecified chronic kidney disease: Secondary | ICD-10-CM | POA: Diagnosis not present

## 2020-11-29 DIAGNOSIS — E222 Syndrome of inappropriate secretion of antidiuretic hormone: Secondary | ICD-10-CM | POA: Diagnosis not present

## 2020-11-29 DIAGNOSIS — Z79899 Other long term (current) drug therapy: Secondary | ICD-10-CM | POA: Diagnosis not present

## 2020-11-29 DIAGNOSIS — E871 Hypo-osmolality and hyponatremia: Secondary | ICD-10-CM | POA: Insufficient documentation

## 2020-11-29 DIAGNOSIS — D638 Anemia in other chronic diseases classified elsewhere: Secondary | ICD-10-CM | POA: Insufficient documentation

## 2020-11-29 LAB — RENAL FUNCTION PANEL
Albumin: 2.9 g/dL — ABNORMAL LOW (ref 3.5–5.0)
Anion gap: 6 (ref 5–15)
BUN: 10 mg/dL (ref 8–23)
CO2: 27 mmol/L (ref 22–32)
Calcium: 8.8 mg/dL — ABNORMAL LOW (ref 8.9–10.3)
Chloride: 94 mmol/L — ABNORMAL LOW (ref 98–111)
Creatinine, Ser: 0.89 mg/dL (ref 0.44–1.00)
GFR, Estimated: >60 mL/min (ref >60)
Glucose, Bld: 99 mg/dL (ref 70–99)
Phosphorus: 3.2 mg/dL (ref 2.5–4.6)
Potassium: 3.8 mmol/L (ref 3.5–5.1)
Sodium: 127 mmol/L — ABNORMAL LOW (ref 135–145)

## 2020-11-29 LAB — MAGNESIUM: Magnesium: 1.4 mg/dL — ABNORMAL LOW (ref 1.7–2.4)

## 2020-12-02 DIAGNOSIS — N183 Chronic kidney disease, stage 3 unspecified: Secondary | ICD-10-CM | POA: Diagnosis not present

## 2020-12-02 DIAGNOSIS — M10361 Gout due to renal impairment, right knee: Secondary | ICD-10-CM | POA: Diagnosis not present

## 2020-12-02 DIAGNOSIS — I129 Hypertensive chronic kidney disease with stage 1 through stage 4 chronic kidney disease, or unspecified chronic kidney disease: Secondary | ICD-10-CM | POA: Diagnosis not present

## 2020-12-02 DIAGNOSIS — I482 Chronic atrial fibrillation, unspecified: Secondary | ICD-10-CM | POA: Diagnosis not present

## 2020-12-02 DIAGNOSIS — D638 Anemia in other chronic diseases classified elsewhere: Secondary | ICD-10-CM | POA: Diagnosis not present

## 2020-12-02 DIAGNOSIS — D61818 Other pancytopenia: Secondary | ICD-10-CM | POA: Diagnosis not present

## 2020-12-02 LAB — MISC LABCORP TEST (SEND OUT): Labcorp test code: 451953

## 2020-12-03 DIAGNOSIS — I714 Abdominal aortic aneurysm, without rupture: Secondary | ICD-10-CM | POA: Diagnosis not present

## 2020-12-03 DIAGNOSIS — I700 Atherosclerosis of aorta: Secondary | ICD-10-CM | POA: Diagnosis not present

## 2020-12-03 DIAGNOSIS — G9009 Other idiopathic peripheral autonomic neuropathy: Secondary | ICD-10-CM | POA: Diagnosis not present

## 2020-12-03 DIAGNOSIS — J9601 Acute respiratory failure with hypoxia: Secondary | ICD-10-CM | POA: Diagnosis not present

## 2020-12-03 DIAGNOSIS — R634 Abnormal weight loss: Secondary | ICD-10-CM | POA: Diagnosis not present

## 2020-12-03 DIAGNOSIS — N1831 Chronic kidney disease, stage 3a: Secondary | ICD-10-CM | POA: Diagnosis not present

## 2020-12-03 DIAGNOSIS — D519 Vitamin B12 deficiency anemia, unspecified: Secondary | ICD-10-CM | POA: Diagnosis not present

## 2020-12-03 DIAGNOSIS — D72819 Decreased white blood cell count, unspecified: Secondary | ICD-10-CM | POA: Diagnosis not present

## 2020-12-03 DIAGNOSIS — D509 Iron deficiency anemia, unspecified: Secondary | ICD-10-CM | POA: Diagnosis not present

## 2020-12-03 DIAGNOSIS — R531 Weakness: Secondary | ICD-10-CM | POA: Diagnosis not present

## 2020-12-03 DIAGNOSIS — D696 Thrombocytopenia, unspecified: Secondary | ICD-10-CM | POA: Diagnosis not present

## 2020-12-03 DIAGNOSIS — D649 Anemia, unspecified: Secondary | ICD-10-CM | POA: Diagnosis not present

## 2020-12-03 DIAGNOSIS — D508 Other iron deficiency anemias: Secondary | ICD-10-CM | POA: Diagnosis not present

## 2020-12-03 DIAGNOSIS — Z853 Personal history of malignant neoplasm of breast: Secondary | ICD-10-CM | POA: Diagnosis not present

## 2020-12-03 DIAGNOSIS — Z85118 Personal history of other malignant neoplasm of bronchus and lung: Secondary | ICD-10-CM | POA: Diagnosis not present

## 2020-12-03 DIAGNOSIS — D469 Myelodysplastic syndrome, unspecified: Secondary | ICD-10-CM | POA: Diagnosis not present

## 2020-12-03 DIAGNOSIS — D61818 Other pancytopenia: Secondary | ICD-10-CM | POA: Diagnosis not present

## 2020-12-03 DIAGNOSIS — E871 Hypo-osmolality and hyponatremia: Secondary | ICD-10-CM | POA: Diagnosis not present

## 2020-12-03 DIAGNOSIS — J441 Chronic obstructive pulmonary disease with (acute) exacerbation: Secondary | ICD-10-CM | POA: Diagnosis not present

## 2020-12-03 DIAGNOSIS — J9611 Chronic respiratory failure with hypoxia: Secondary | ICD-10-CM | POA: Diagnosis not present

## 2020-12-04 DIAGNOSIS — N183 Chronic kidney disease, stage 3 unspecified: Secondary | ICD-10-CM | POA: Diagnosis not present

## 2020-12-04 DIAGNOSIS — D638 Anemia in other chronic diseases classified elsewhere: Secondary | ICD-10-CM | POA: Diagnosis not present

## 2020-12-04 DIAGNOSIS — D61818 Other pancytopenia: Secondary | ICD-10-CM | POA: Diagnosis not present

## 2020-12-04 DIAGNOSIS — M10361 Gout due to renal impairment, right knee: Secondary | ICD-10-CM | POA: Diagnosis not present

## 2020-12-04 DIAGNOSIS — I129 Hypertensive chronic kidney disease with stage 1 through stage 4 chronic kidney disease, or unspecified chronic kidney disease: Secondary | ICD-10-CM | POA: Diagnosis not present

## 2020-12-04 DIAGNOSIS — I482 Chronic atrial fibrillation, unspecified: Secondary | ICD-10-CM | POA: Diagnosis not present

## 2020-12-05 ENCOUNTER — Ambulatory Visit (HOSPITAL_COMMUNITY): Admission: RE | Admit: 2020-12-05 | Payer: Medicare Other | Source: Ambulatory Visit

## 2020-12-05 ENCOUNTER — Encounter (HOSPITAL_COMMUNITY): Payer: Self-pay

## 2020-12-06 ENCOUNTER — Other Ambulatory Visit: Payer: Self-pay

## 2020-12-06 ENCOUNTER — Ambulatory Visit: Payer: Medicare Other | Admitting: Gastroenterology

## 2020-12-06 ENCOUNTER — Other Ambulatory Visit (HOSPITAL_COMMUNITY)
Admission: RE | Admit: 2020-12-06 | Discharge: 2020-12-06 | Disposition: A | Discharge status: Home / Self Care | Payer: Medicare Other | Source: Ambulatory Visit | Attending: Nephrology | Admitting: Nephrology

## 2020-12-06 ENCOUNTER — Encounter: Payer: Self-pay | Admitting: Internal Medicine

## 2020-12-06 DIAGNOSIS — N170 Acute kidney failure with tubular necrosis: Secondary | ICD-10-CM | POA: Diagnosis not present

## 2020-12-06 DIAGNOSIS — E871 Hypo-osmolality and hyponatremia: Secondary | ICD-10-CM | POA: Insufficient documentation

## 2020-12-06 DIAGNOSIS — I129 Hypertensive chronic kidney disease with stage 1 through stage 4 chronic kidney disease, or unspecified chronic kidney disease: Secondary | ICD-10-CM | POA: Insufficient documentation

## 2020-12-06 LAB — RENAL FUNCTION PANEL
Albumin: 2.9 g/dL — ABNORMAL LOW (ref 3.5–5.0)
Anion gap: 7 (ref 5–15)
BUN: 13 mg/dL (ref 8–23)
CO2: 29 mmol/L (ref 22–32)
Calcium: 8.8 mg/dL — ABNORMAL LOW (ref 8.9–10.3)
Chloride: 94 mmol/L — ABNORMAL LOW (ref 98–111)
Creatinine, Ser: 0.93 mg/dL (ref 0.44–1.00)
GFR, Estimated: >60 mL/min (ref >60)
Glucose, Bld: 99 mg/dL (ref 70–99)
Phosphorus: 3.4 mg/dL (ref 2.5–4.6)
Potassium: 4.4 mmol/L (ref 3.5–5.1)
Sodium: 130 mmol/L — ABNORMAL LOW (ref 135–145)

## 2020-12-06 LAB — MAGNESIUM: Magnesium: 1.8 mg/dL (ref 1.7–2.4)

## 2020-12-11 DIAGNOSIS — N183 Chronic kidney disease, stage 3 unspecified: Secondary | ICD-10-CM | POA: Diagnosis not present

## 2020-12-11 DIAGNOSIS — D61818 Other pancytopenia: Secondary | ICD-10-CM | POA: Diagnosis not present

## 2020-12-11 DIAGNOSIS — I482 Chronic atrial fibrillation, unspecified: Secondary | ICD-10-CM | POA: Diagnosis not present

## 2020-12-11 DIAGNOSIS — I129 Hypertensive chronic kidney disease with stage 1 through stage 4 chronic kidney disease, or unspecified chronic kidney disease: Secondary | ICD-10-CM | POA: Diagnosis not present

## 2020-12-11 DIAGNOSIS — M10361 Gout due to renal impairment, right knee: Secondary | ICD-10-CM | POA: Diagnosis not present

## 2020-12-11 DIAGNOSIS — D638 Anemia in other chronic diseases classified elsewhere: Secondary | ICD-10-CM | POA: Diagnosis not present

## 2020-12-12 ENCOUNTER — Inpatient Hospital Stay (HOSPITAL_COMMUNITY): Payer: Medicare Other | Attending: Hematology | Admitting: Hematology

## 2020-12-12 ENCOUNTER — Inpatient Hospital Stay (HOSPITAL_COMMUNITY): Payer: Medicare Other

## 2020-12-12 ENCOUNTER — Other Ambulatory Visit: Payer: Self-pay

## 2020-12-12 VITALS — HR 106 | Temp 98.7°F | Resp 18 | Wt 157.3 lb

## 2020-12-12 DIAGNOSIS — G629 Polyneuropathy, unspecified: Secondary | ICD-10-CM | POA: Insufficient documentation

## 2020-12-12 DIAGNOSIS — Z87891 Personal history of nicotine dependence: Secondary | ICD-10-CM | POA: Insufficient documentation

## 2020-12-12 DIAGNOSIS — Z853 Personal history of malignant neoplasm of breast: Secondary | ICD-10-CM | POA: Insufficient documentation

## 2020-12-12 DIAGNOSIS — C9310 Chronic myelomonocytic leukemia not having achieved remission: Secondary | ICD-10-CM | POA: Diagnosis not present

## 2020-12-12 DIAGNOSIS — Z888 Allergy status to other drugs, medicaments and biological substances status: Secondary | ICD-10-CM | POA: Diagnosis not present

## 2020-12-12 DIAGNOSIS — E871 Hypo-osmolality and hyponatremia: Secondary | ICD-10-CM | POA: Insufficient documentation

## 2020-12-12 DIAGNOSIS — G479 Sleep disorder, unspecified: Secondary | ICD-10-CM | POA: Diagnosis not present

## 2020-12-12 DIAGNOSIS — D469 Myelodysplastic syndrome, unspecified: Secondary | ICD-10-CM | POA: Insufficient documentation

## 2020-12-12 DIAGNOSIS — Z8719 Personal history of other diseases of the digestive system: Secondary | ICD-10-CM | POA: Insufficient documentation

## 2020-12-12 DIAGNOSIS — I10 Essential (primary) hypertension: Secondary | ICD-10-CM | POA: Insufficient documentation

## 2020-12-12 DIAGNOSIS — Z809 Family history of malignant neoplasm, unspecified: Secondary | ICD-10-CM | POA: Insufficient documentation

## 2020-12-12 DIAGNOSIS — E039 Hypothyroidism, unspecified: Secondary | ICD-10-CM | POA: Insufficient documentation

## 2020-12-12 DIAGNOSIS — Z79899 Other long term (current) drug therapy: Secondary | ICD-10-CM | POA: Diagnosis not present

## 2020-12-12 DIAGNOSIS — Z85118 Personal history of other malignant neoplasm of bronchus and lung: Secondary | ICD-10-CM | POA: Insufficient documentation

## 2020-12-12 DIAGNOSIS — M25561 Pain in right knee: Secondary | ICD-10-CM | POA: Insufficient documentation

## 2020-12-12 DIAGNOSIS — Z923 Personal history of irradiation: Secondary | ICD-10-CM | POA: Diagnosis not present

## 2020-12-12 DIAGNOSIS — C50912 Malignant neoplasm of unspecified site of left female breast: Secondary | ICD-10-CM

## 2020-12-12 DIAGNOSIS — M25562 Pain in left knee: Secondary | ICD-10-CM | POA: Diagnosis not present

## 2020-12-12 DIAGNOSIS — D61818 Other pancytopenia: Secondary | ICD-10-CM | POA: Insufficient documentation

## 2020-12-12 LAB — COMPREHENSIVE METABOLIC PANEL
ALT: 9 U/L (ref 0–44)
AST: 20 U/L (ref 15–41)
Albumin: 3 g/dL — ABNORMAL LOW (ref 3.5–5.0)
Alkaline Phosphatase: 45 U/L (ref 38–126)
Anion gap: 7 (ref 5–15)
BUN: 12 mg/dL (ref 8–23)
CO2: 28 mmol/L (ref 22–32)
Calcium: 9 mg/dL (ref 8.9–10.3)
Chloride: 92 mmol/L — ABNORMAL LOW (ref 98–111)
Creatinine, Ser: 0.92 mg/dL (ref 0.44–1.00)
GFR, Estimated: >60 mL/min (ref >60)
Glucose, Bld: 100 mg/dL — ABNORMAL HIGH (ref 70–99)
Potassium: 4.2 mmol/L (ref 3.5–5.1)
Sodium: 127 mmol/L — ABNORMAL LOW (ref 135–145)
Total Bilirubin: 0.9 mg/dL (ref 0.3–1.2)
Total Protein: 8.8 g/dL — ABNORMAL HIGH (ref 6.5–8.1)

## 2020-12-12 LAB — CBC WITH DIFFERENTIAL/PLATELET
Band Neutrophils: 1 %
Basophils Absolute: 0 10*3/uL (ref 0.0–0.1)
Basophils Relative: 0 %
Eosinophils Absolute: 0 10*3/uL (ref 0.0–0.5)
Eosinophils Relative: 0 %
HCT: 34.2 % — ABNORMAL LOW (ref 36.0–46.0)
Hemoglobin: 9.5 g/dL — ABNORMAL LOW (ref 12.0–15.0)
Lymphocytes Relative: 32 %
Lymphs Abs: 0.9 10*3/uL (ref 0.7–4.0)
MCH: 28.1 pg (ref 26.0–34.0)
MCHC: 27.8 g/dL — ABNORMAL LOW (ref 30.0–36.0)
MCV: 101.2 fL — ABNORMAL HIGH (ref 80.0–100.0)
Metamyelocytes Relative: 3 %
Monocytes Absolute: 1.4 10*3/uL — ABNORMAL HIGH (ref 0.1–1.0)
Monocytes Relative: 49 %
Neutro Abs: 0.4 10*3/uL — CL (ref 1.7–7.7)
Neutrophils Relative %: 15 %
Platelets: 88 10*3/uL — ABNORMAL LOW (ref 150–400)
RBC: 3.38 MIL/uL — ABNORMAL LOW (ref 3.87–5.11)
RDW: 17.9 % — ABNORMAL HIGH (ref 11.5–15.5)
WBC: 2.8 10*3/uL — ABNORMAL LOW (ref 4.0–10.5)
nRBC: 6.1 % — ABNORMAL HIGH (ref 0.0–0.2)

## 2020-12-12 NOTE — Patient Instructions (Addendum)
[place] at [facility] Discharge Instructions  You were seen today by Dr. Delton Coombes. He went over your recent results; your bone marrow did not show any high-risk features and the test was negative for multiple myeloma. Since there is a clonal cell line in the bone marrow, you will benefit from [place], given once every month, to kill off the clonal cells and to improve the other blood counts. Dr. Delton Coombes will see you back in 1 month for labs and follow up.   Thank you for choosing [place] at [facility] to provide your oncology and hematology care.  To afford each patient quality time with our provider, please arrive at least 15 minutes before your scheduled appointment time.   If you have a lab appointment with the [facility] please come in thru the Main Entrance and check in at the main information desk  You need to re-schedule your appointment should you arrive 10 or more minutes late.  We strive to give you quality time with our providers, and arriving late affects you and other patients whose appointments are after yours.  Also, if you no show three or more times for appointments you may be dismissed from the clinic at the providers discretion.     Again, thank you for choosing [facility].  Our hope is that these requests will decrease the amount of time that you wait before being seen by our physicians.       _____________________________________________________________  Should you have questions after your visit to [facility], please contact our office at [phone] between the hours of 8:00 a.m. and 4:30 p.m.  Voicemails left after 4:00 p.m. will not be returned until the following business day.  For prescription refill requests, have your pharmacy contact our office and allow 72 hours.    Cancer Center Support Programs:   > Cancer Support Group  2nd Tuesday of the month 1pm-2pm, Journey Room

## 2020-12-12 NOTE — Progress Notes (Signed)
[place], [place] [address]   CLINIC:  Medical Oncology/Hematology  PCP:  Celene Squibb, MD [address] [place] [address]  [phone]  REASON FOR VISIT:  Follow-up for MDS and left breast cancer  PRIOR THERAPY: Lumpectomy and XRT in 1999, followed by tamoxifen for 5 days  CURRENT THERAPY: Under work-up  INTERVAL HISTORY:  [name], a 87 y.o. female, returns for routine follow-up for her MDS and left breast cancer. Lisa [name] was last seen on 11/19/2020.  Today she is accompanied by her son and she reports feeling okay. She continues having extreme fatigue and also complains of having disrupted sleep patterns. According to her grandson, she started declining mentally after Thanksgiving and still has not returned back to baseline.  She walks 3-4 times to the bathroom using a walker, otherwise she is sedentary. She had PT come to her home yesterday and gave her exercises to do. She is able to partly dress herself. She is not able to fix her meals.   REVIEW OF SYSTEMS:  Review of Systems  Constitutional: Positive for fatigue (depleted). Negative for appetite change.  Musculoskeletal: Positive for arthralgias (bilat knees & lower legs).  Psychiatric/Behavioral: Positive for sleep disturbance.  All other systems reviewed and are negative.   PAST MEDICAL/SURGICAL HISTORY:  Past Medical History:  Diagnosis Date  . Adenocarcinoma of left lung ([facility]) 2006  . Arthritis   . Asthma   . Cancer of breast, female ([place])   . Cancer of lung ([facility])   . Hypertension   . Hypothyroidism   . Invasive ductal carcinoma of left breast ([facility]) 1999  . Neutropenia ([facility]) 06/09/2016  . Personal history of radiation therapy    Past Surgical History:  Procedure Laterality Date  . BIOPSY  10/23/2020   Procedure: BIOPSY;  Surgeon: Yetta Flock, MD;  Location: [facility] ENDOSCOPY;  Service: Gastroenterology;;  . BREAST BIOPSY     left axillary  node dissection  . BREAST LUMPECTOMY Left   . COLONOSCOPY  03/08/2003   GHW:EXHBZJIRCV rectal polyps destroyed with the tip of the snare/Polyps at hepatic flexure, splenic flexure at 35 cm/Left-sided diverticula: unable to retrieve path  . COLONOSCOPY  09/12/2008   ELF:YBOFBP rectum and distal sigmoid diminutive polyps/scattered left sided diverticulum. hyperplastic  . COLONOSCOPY N/A 03/06/2013   ZWC:HENIDPO polyp-removed as described above; colonic diverticulosis. hyperplastic polyps. next TCS 03/2018  . ESOPHAGOGASTRODUODENOSCOPY (EGD) WITH PROPOFOL N/A 10/23/2020   Procedure: ESOPHAGOGASTRODUODENOSCOPY (EGD) WITH PROPOFOL;  Surgeon: Yetta Flock, MD;  Location: [facility];  Service: Gastroenterology;  Laterality: N/A;  . FOOT SURGERY    . LUNG REMOVAL, PARTIAL     upper lobe    SOCIAL HISTORY:  Social History   Socioeconomic History  . Marital status: Widowed    Spouse name: Not on file  . Number of children: Not on file  . Years of education: Not on file  . Highest education level: Not on file  Occupational History  . Not on file  Tobacco Use  . Smoking status: Former Smoker    Packs/day: 0.75    Years: 35.00    Pack years: 26.25    Quit date: 1993    Years since quitting: 29.1  . Smokeless tobacco: Never Used  . Tobacco comment: smoke-free X 30 yeras  Vaping Use  . Vaping Use: Never used  Substance and Sexual Activity  . Alcohol use: No  . Drug use: No  .  Sexual activity: Not Currently  Other Topics Concern  . Not on file  Social History Narrative  . Not on file   Social Determinants of Health   Financial Resource Strain: Not on file  Food Insecurity: Not on file  Transportation Needs: No Transportation Needs  . Lack of Transportation (Medical): No  . Lack of Transportation (Non-Medical): No  Physical Activity: Inactive  . Days of Exercise per Week: 0 days  . Minutes of Exercise per Session: 0 min  Stress: Not on file  Social Connections: Not on  file  Intimate Partner Violence: Not At Risk  . Fear of Current or Ex-Partner: No  . Emotionally Abused: No  . Physically Abused: No  . Sexually Abused: No    FAMILY HISTORY:  Family History  Problem Relation Age of Onset  . Cancer Mother   . Cancer Sister   . Colon cancer Neg Hx     CURRENT MEDICATIONS:  Current Outpatient Medications  Medication Sig Dispense Refill  . acetaminophen (TYLENOL) 325 MG tablet Take 2 tablets (650 mg total) by mouth every 6 (six) hours.    Marland Kitchen albuterol (PROVENTIL,VENTOLIN) 90 MCG/ACT inhaler Inhale 2 puffs into the lungs every 6 (six) hours as needed for wheezing or shortness of breath. 17 g 12  . fluticasone furoate-vilanterol (BREO ELLIPTA) 100-25 MCG/INH AEPB Inhale 1 puff into the lungs daily. 1 each 1  . furosemide (LASIX) 20 MG tablet     . gabapentin (NEURONTIN) 600 MG tablet     . Iron-FA-B Cmp-C-Biot-Probiotic (FUSION PLUS) CAPS Take 1 capsule by mouth daily.    Marland Kitchen levothyroxine (SYNTHROID) 100 MCG tablet Take 1 tablet (100 mcg total) by mouth daily at 6 (six) AM. 90 tablet 0  . metoprolol tartrate (LOPRESSOR) 25 MG tablet Take 0.5 tablets (12.5 mg total) by mouth 2 (two) times daily. 90 tablet 0  . pantoprazole (PROTONIX) 40 MG tablet Take 1 tablet (40 mg total) by mouth daily. 30 tablet 0  . polyethylene glycol (MIRALAX / GLYCOLAX) 17 g packet Take 17 g by mouth daily as needed for moderate constipation. 14 each 0  . senna-docusate (SENOKOT-S) 8.6-50 MG tablet Take 1 tablet by mouth at bedtime.    . traMADol (ULTRAM) 50 MG tablet Take 1 tablet (50 mg total) by mouth every 6 (six) hours as needed for moderate pain. 30 tablet 0  . traZODone (DESYREL) 50 MG tablet Take 1 tablet (50 mg total) by mouth at bedtime. 30 tablet 0  . TRELEGY ELLIPTA 100-62.5-25 MCG/INH AEPB Inhale 1 puff into the lungs daily.     . diclofenac Sodium (VOLTAREN) 1 % GEL Apply 4 g topically 4 (four) times daily. (Patient not taking: Reported on 12/12/2020) 4 g 0   No  current facility-administered medications for this visit.    ALLERGIES:  Allergies  Allergen Reactions  . Meloxicam Other (See Comments)    Caused an injury to the kidneys, per nephrologist  . Micardis Hct [Telmisartan-Hctz] Other (See Comments)    Caused an injury to the kidneys, per nephrologist    PHYSICAL EXAM:  Performance status (ECOG): 3 - Symptomatic, >50% confined to bed  Vitals:   12/12/20 1002  Pulse: (!) 106  Resp: 18  Temp: 98.7 F (37.1 C)  SpO2: 95%   Wt Readings from Last 3 Encounters:  12/12/20 157 lb 5 oz (71.4 kg)  11/19/20 196 lb 8 oz (89.1 kg)  10/30/20 205 lb 7.5 oz (93.2 kg)   Physical Exam Vitals reviewed.  Constitutional:      Appearance: Normal appearance.     Comments: In wheelchair  Neurological:     General: No focal deficit present.     Mental Status: She is alert and oriented to person, place, and time.     Motor: Weakness (proximal weakness in BUE & BLE) present.  Psychiatric:        Mood and Affect: Mood normal.        Behavior: Behavior normal.     LABORATORY DATA:  I have reviewed the labs as listed.  CBC Latest Ref Rng & Units 12/12/2020 11/19/2020 11/13/2020  WBC 4.0 - 10.5 K/uL 2.8(L) 2.2(L) 2.1(L)  Hemoglobin 12.0 - 15.0 g/dL 9.5(L) 9.9(L) 8.3(L)  Hematocrit 36.0 - 46.0 % 34.2(L) 34.5(L) 29.0(L)  Platelets 150 - 400 K/uL 88(L) 84(L) 75(L)   CMP Latest Ref Rng & Units 12/12/2020 12/06/2020 11/29/2020  Glucose 70 - 99 mg/dL 100(H) 99 99  BUN 8 - 23 mg/dL '12 13 10  ' Creatinine 0.44 - 1.00 mg/dL 0.92 0.93 0.89  Sodium 135 - 145 mmol/L 127(L) 130(L) 127(L)  Potassium 3.5 - 5.1 mmol/L 4.2 4.4 3.8  Chloride 98 - 111 mmol/L 92(L) 94(L) 94(L)  CO2 22 - 32 mmol/L '28 29 27  ' Calcium 8.9 - 10.3 mg/dL 9.0 8.8(L) 8.8(L)  Total Protein 6.5 - 8.1 g/dL 8.8(H) - -  Total Bilirubin 0.3 - 1.2 mg/dL 0.9 - -  Alkaline Phos 38 - 126 U/L 45 - -  AST 15 - 41 U/L 20 - -  ALT 0 - 44 U/L 9 - -      Component Value Date/Time   RBC 3.38 (L)  12/12/2020 0909   MCV 101.2 (H) 12/12/2020 0909   MCH 28.1 12/12/2020 0909   MCHC 27.8 (L) 12/12/2020 0909   RDW 17.9 (H) 12/12/2020 0909   LYMPHSABS 0.9 12/12/2020 0909   MONOABS 1.4 (H) 12/12/2020 0909   EOSABS 0.0 12/12/2020 0909   BASOSABS 0.0 12/12/2020 0909    DIAGNOSTIC IMAGING:  I have independently reviewed the scans and discussed with the patient. No results found.   ASSESSMENT:  1.    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 [place] Crane for pancytopenia. Subsequently her white count has recovered along with recovery of the hemoglobin. -Bone marrow biopsy on 11/06/2020 shows hypercellular marrow with trilineage dyspoiesis and polytypic plasmacytosis.  Primary differential includes CMML and other MDS.  Definitive blast population is not identified morphologically or by flow.  There is a increased immature mononuclear cells favored to be monocytic in origin.  Megakaryocytes are frequently hypolobated and hyperchromatic without clustering.  Atypical appearing erythroid precursors.  Increased plasma cells 5 to 9% which are polytypic by kappa and lambda staining. - Chromosome analysis shows 46,X, idiocentric X chromosome[9]/46, XX[11]. -MDS FISH panel was negative. -Revised IPSS score of 3.  In the absence of treatment, median survival of 5.3 years and 25% AML progression is 10.8 years. -SPEP, methylmalonic acid, ferritin levels were normal.  2.  Stage I left breast cancer: -Diagnosed in Sep 1999, treated with lumpectomy and XRT. ER/PR was positive and received tamoxifen for 5 years. -Mammogram on 2019-10-30 was BI-RADS Category 2. Physical exam did not reveal any palpable masses today.  3.  Stage I adenocarcinoma of the left upper lobe of the lung: -Left upper lobectomy on 2005-06-01. CT chest on 2018-06-09 was stable. - CT CAP on 10/24/2020 shows stable postsurgical changes from the partial left upper lobe resection.  No other signs of  malignancy.   PLAN:  1.    Low risk MDS/CMML: -I have reviewed results of the MDS FISH panel which was normal. -She is considered to have low risk based on revised IPSS classification with a score of 3. -She is very weak and just started physical therapy at home. -Patient's grandson reports that she had proximal myopathy and positive connective tissue disorder work-up.  She was apparently supposed to be seen by rheumatology and ended up admitted to the hospital.  She will follow up with rheumatology. -Reviewed CBC today with white count 2.8.  ANC is still low at 0.4.  Platelet count slightly improved to 88.  Hemoglobin 9.5. -She has severe neutropenia only since the recent hospitalization.  Differential includes medication induced.  We will try to closely watch at this time. -I did not recommend azacitidine at this time due to poor performance status. -We will check EPO level at next visit. -Due to her previous history of malignancies including lung and breast, I have recommended PET scan prior to next visit in 4 weeks.  2.  Peripheral neuropathy: - Continue gabapentin.  3.  Hyponatremia: -She became hyponatremic during hospitalization.  Thought to be secondary to SIADH. -Sodium level today is 127 without significant change from her baseline.  Orders placed this encounter:  No orders of the defined types were placed in this encounter.  Total time spent is 30 minutes with more than 80% of the time spent face-to-face discussing diagnosis, prognosis, treatment options, counseling and coordination of care.  Derek Jack, MD [place] [phone]   I, Milinda Antis, am acting as a scribe for Dr. Sanda Linger.  I, Derek Jack MD, have reviewed the above documentation for accuracy and completeness, and I agree with the above.

## 2020-12-13 ENCOUNTER — Ambulatory Visit (HOSPITAL_COMMUNITY)
Admission: RE | Admit: 2020-12-13 | Discharge: 2020-12-13 | Disposition: A | Discharge status: Home / Self Care | Payer: Medicare Other | Source: Ambulatory Visit | Attending: Nephrology | Admitting: Nephrology

## 2020-12-13 DIAGNOSIS — N1832 Chronic kidney disease, stage 3b: Secondary | ICD-10-CM | POA: Insufficient documentation

## 2020-12-13 DIAGNOSIS — N170 Acute kidney failure with tubular necrosis: Secondary | ICD-10-CM | POA: Diagnosis not present

## 2020-12-13 DIAGNOSIS — N183 Chronic kidney disease, stage 3 unspecified: Secondary | ICD-10-CM | POA: Diagnosis not present

## 2020-12-19 DIAGNOSIS — I129 Hypertensive chronic kidney disease with stage 1 through stage 4 chronic kidney disease, or unspecified chronic kidney disease: Secondary | ICD-10-CM | POA: Diagnosis not present

## 2020-12-19 DIAGNOSIS — N183 Chronic kidney disease, stage 3 unspecified: Secondary | ICD-10-CM | POA: Diagnosis not present

## 2020-12-19 DIAGNOSIS — I482 Chronic atrial fibrillation, unspecified: Secondary | ICD-10-CM | POA: Diagnosis not present

## 2020-12-19 DIAGNOSIS — M10361 Gout due to renal impairment, right knee: Secondary | ICD-10-CM | POA: Diagnosis not present

## 2020-12-19 DIAGNOSIS — D638 Anemia in other chronic diseases classified elsewhere: Secondary | ICD-10-CM | POA: Diagnosis not present

## 2020-12-19 DIAGNOSIS — D61818 Other pancytopenia: Secondary | ICD-10-CM | POA: Diagnosis not present

## 2020-12-21 DIAGNOSIS — M25561 Pain in right knee: Secondary | ICD-10-CM | POA: Diagnosis not present

## 2020-12-21 DIAGNOSIS — Z853 Personal history of malignant neoplasm of breast: Secondary | ICD-10-CM | POA: Diagnosis not present

## 2020-12-21 DIAGNOSIS — M10362 Gout due to renal impairment, left knee: Secondary | ICD-10-CM | POA: Diagnosis not present

## 2020-12-21 DIAGNOSIS — E039 Hypothyroidism, unspecified: Secondary | ICD-10-CM | POA: Diagnosis not present

## 2020-12-21 DIAGNOSIS — M25562 Pain in left knee: Secondary | ICD-10-CM | POA: Diagnosis not present

## 2020-12-21 DIAGNOSIS — I482 Chronic atrial fibrillation, unspecified: Secondary | ICD-10-CM | POA: Diagnosis not present

## 2020-12-21 DIAGNOSIS — I129 Hypertensive chronic kidney disease with stage 1 through stage 4 chronic kidney disease, or unspecified chronic kidney disease: Secondary | ICD-10-CM | POA: Diagnosis not present

## 2020-12-21 DIAGNOSIS — Z87891 Personal history of nicotine dependence: Secondary | ICD-10-CM | POA: Diagnosis not present

## 2020-12-21 DIAGNOSIS — N183 Chronic kidney disease, stage 3 unspecified: Secondary | ICD-10-CM | POA: Diagnosis not present

## 2020-12-21 DIAGNOSIS — D638 Anemia in other chronic diseases classified elsewhere: Secondary | ICD-10-CM | POA: Diagnosis not present

## 2020-12-21 DIAGNOSIS — M10361 Gout due to renal impairment, right knee: Secondary | ICD-10-CM | POA: Diagnosis not present

## 2020-12-21 DIAGNOSIS — D61818 Other pancytopenia: Secondary | ICD-10-CM | POA: Diagnosis not present

## 2020-12-21 DIAGNOSIS — E669 Obesity, unspecified: Secondary | ICD-10-CM | POA: Diagnosis not present

## 2020-12-21 DIAGNOSIS — J45909 Unspecified asthma, uncomplicated: Secondary | ICD-10-CM | POA: Diagnosis not present

## 2020-12-21 DIAGNOSIS — Z85118 Personal history of other malignant neoplasm of bronchus and lung: Secondary | ICD-10-CM | POA: Diagnosis not present

## 2020-12-21 DIAGNOSIS — B9681 Helicobacter pylori [H. pylori] as the cause of diseases classified elsewhere: Secondary | ICD-10-CM | POA: Diagnosis not present

## 2020-12-23 DIAGNOSIS — M10361 Gout due to renal impairment, right knee: Secondary | ICD-10-CM | POA: Diagnosis not present

## 2020-12-23 DIAGNOSIS — D638 Anemia in other chronic diseases classified elsewhere: Secondary | ICD-10-CM | POA: Diagnosis not present

## 2020-12-23 DIAGNOSIS — I129 Hypertensive chronic kidney disease with stage 1 through stage 4 chronic kidney disease, or unspecified chronic kidney disease: Secondary | ICD-10-CM | POA: Diagnosis not present

## 2020-12-23 DIAGNOSIS — D61818 Other pancytopenia: Secondary | ICD-10-CM | POA: Diagnosis not present

## 2020-12-23 DIAGNOSIS — I482 Chronic atrial fibrillation, unspecified: Secondary | ICD-10-CM | POA: Diagnosis not present

## 2020-12-23 DIAGNOSIS — N183 Chronic kidney disease, stage 3 unspecified: Secondary | ICD-10-CM | POA: Diagnosis not present

## 2020-12-25 DIAGNOSIS — I129 Hypertensive chronic kidney disease with stage 1 through stage 4 chronic kidney disease, or unspecified chronic kidney disease: Secondary | ICD-10-CM | POA: Diagnosis not present

## 2020-12-25 DIAGNOSIS — M10361 Gout due to renal impairment, right knee: Secondary | ICD-10-CM | POA: Diagnosis not present

## 2020-12-25 DIAGNOSIS — D61818 Other pancytopenia: Secondary | ICD-10-CM | POA: Diagnosis not present

## 2020-12-25 DIAGNOSIS — N183 Chronic kidney disease, stage 3 unspecified: Secondary | ICD-10-CM | POA: Diagnosis not present

## 2020-12-25 DIAGNOSIS — I482 Chronic atrial fibrillation, unspecified: Secondary | ICD-10-CM | POA: Diagnosis not present

## 2020-12-25 DIAGNOSIS — D638 Anemia in other chronic diseases classified elsewhere: Secondary | ICD-10-CM | POA: Diagnosis not present

## 2020-12-26 DIAGNOSIS — R627 Adult failure to thrive: Secondary | ICD-10-CM | POA: Diagnosis not present

## 2020-12-26 DIAGNOSIS — R300 Dysuria: Secondary | ICD-10-CM | POA: Diagnosis not present

## 2020-12-26 DIAGNOSIS — R7989 Other specified abnormal findings of blood chemistry: Secondary | ICD-10-CM | POA: Diagnosis not present

## 2020-12-26 DIAGNOSIS — M2550 Pain in unspecified joint: Secondary | ICD-10-CM | POA: Diagnosis not present

## 2020-12-26 DIAGNOSIS — E790 Hyperuricemia without signs of inflammatory arthritis and tophaceous disease: Secondary | ICD-10-CM | POA: Diagnosis not present

## 2020-12-26 DIAGNOSIS — Z681 Body mass index (BMI) 19 or less, adult: Secondary | ICD-10-CM | POA: Diagnosis not present

## 2020-12-26 DIAGNOSIS — D469 Myelodysplastic syndrome, unspecified: Secondary | ICD-10-CM | POA: Diagnosis not present

## 2020-12-26 DIAGNOSIS — M150 Primary generalized (osteo)arthritis: Secondary | ICD-10-CM | POA: Diagnosis not present

## 2020-12-26 DIAGNOSIS — R29898 Other symptoms and signs involving the musculoskeletal system: Secondary | ICD-10-CM | POA: Diagnosis not present

## 2020-12-27 DIAGNOSIS — I482 Chronic atrial fibrillation, unspecified: Secondary | ICD-10-CM | POA: Diagnosis not present

## 2020-12-27 DIAGNOSIS — I129 Hypertensive chronic kidney disease with stage 1 through stage 4 chronic kidney disease, or unspecified chronic kidney disease: Secondary | ICD-10-CM | POA: Diagnosis not present

## 2020-12-27 DIAGNOSIS — N183 Chronic kidney disease, stage 3 unspecified: Secondary | ICD-10-CM | POA: Diagnosis not present

## 2020-12-27 DIAGNOSIS — M10361 Gout due to renal impairment, right knee: Secondary | ICD-10-CM | POA: Diagnosis not present

## 2020-12-27 DIAGNOSIS — D61818 Other pancytopenia: Secondary | ICD-10-CM | POA: Diagnosis not present

## 2020-12-27 DIAGNOSIS — D638 Anemia in other chronic diseases classified elsewhere: Secondary | ICD-10-CM | POA: Diagnosis not present

## 2020-12-30 DIAGNOSIS — G9009 Other idiopathic peripheral autonomic neuropathy: Secondary | ICD-10-CM | POA: Diagnosis not present

## 2020-12-30 DIAGNOSIS — R634 Abnormal weight loss: Secondary | ICD-10-CM | POA: Diagnosis not present

## 2020-12-30 DIAGNOSIS — Z85118 Personal history of other malignant neoplasm of bronchus and lung: Secondary | ICD-10-CM | POA: Diagnosis not present

## 2020-12-30 DIAGNOSIS — I714 Abdominal aortic aneurysm, without rupture: Secondary | ICD-10-CM | POA: Diagnosis not present

## 2020-12-30 DIAGNOSIS — J441 Chronic obstructive pulmonary disease with (acute) exacerbation: Secondary | ICD-10-CM | POA: Diagnosis not present

## 2020-12-30 DIAGNOSIS — D649 Anemia, unspecified: Secondary | ICD-10-CM | POA: Diagnosis not present

## 2020-12-30 DIAGNOSIS — J9601 Acute respiratory failure with hypoxia: Secondary | ICD-10-CM | POA: Diagnosis not present

## 2020-12-30 DIAGNOSIS — Z853 Personal history of malignant neoplasm of breast: Secondary | ICD-10-CM | POA: Diagnosis not present

## 2020-12-30 DIAGNOSIS — I700 Atherosclerosis of aorta: Secondary | ICD-10-CM | POA: Diagnosis not present

## 2020-12-30 DIAGNOSIS — N3001 Acute cystitis with hematuria: Secondary | ICD-10-CM | POA: Diagnosis not present

## 2020-12-30 DIAGNOSIS — D519 Vitamin B12 deficiency anemia, unspecified: Secondary | ICD-10-CM | POA: Diagnosis not present

## 2020-12-30 DIAGNOSIS — R531 Weakness: Secondary | ICD-10-CM | POA: Diagnosis not present

## 2020-12-30 DIAGNOSIS — R309 Painful micturition, unspecified: Secondary | ICD-10-CM | POA: Diagnosis not present

## 2020-12-30 DIAGNOSIS — D508 Other iron deficiency anemias: Secondary | ICD-10-CM | POA: Diagnosis not present

## 2020-12-31 DIAGNOSIS — M10361 Gout due to renal impairment, right knee: Secondary | ICD-10-CM | POA: Diagnosis not present

## 2020-12-31 DIAGNOSIS — I482 Chronic atrial fibrillation, unspecified: Secondary | ICD-10-CM | POA: Diagnosis not present

## 2020-12-31 DIAGNOSIS — N183 Chronic kidney disease, stage 3 unspecified: Secondary | ICD-10-CM | POA: Diagnosis not present

## 2020-12-31 DIAGNOSIS — D61818 Other pancytopenia: Secondary | ICD-10-CM | POA: Diagnosis not present

## 2020-12-31 DIAGNOSIS — D638 Anemia in other chronic diseases classified elsewhere: Secondary | ICD-10-CM | POA: Diagnosis not present

## 2020-12-31 DIAGNOSIS — I129 Hypertensive chronic kidney disease with stage 1 through stage 4 chronic kidney disease, or unspecified chronic kidney disease: Secondary | ICD-10-CM | POA: Diagnosis not present

## 2021-01-01 DIAGNOSIS — Z85118 Personal history of other malignant neoplasm of bronchus and lung: Secondary | ICD-10-CM | POA: Diagnosis not present

## 2021-01-01 DIAGNOSIS — Z853 Personal history of malignant neoplasm of breast: Secondary | ICD-10-CM | POA: Diagnosis not present

## 2021-01-01 DIAGNOSIS — D519 Vitamin B12 deficiency anemia, unspecified: Secondary | ICD-10-CM | POA: Diagnosis not present

## 2021-01-01 DIAGNOSIS — R634 Abnormal weight loss: Secondary | ICD-10-CM | POA: Diagnosis not present

## 2021-01-01 DIAGNOSIS — I700 Atherosclerosis of aorta: Secondary | ICD-10-CM | POA: Diagnosis not present

## 2021-01-01 DIAGNOSIS — J441 Chronic obstructive pulmonary disease with (acute) exacerbation: Secondary | ICD-10-CM | POA: Diagnosis not present

## 2021-01-01 DIAGNOSIS — D649 Anemia, unspecified: Secondary | ICD-10-CM | POA: Diagnosis not present

## 2021-01-01 DIAGNOSIS — R531 Weakness: Secondary | ICD-10-CM | POA: Diagnosis not present

## 2021-01-01 DIAGNOSIS — D508 Other iron deficiency anemias: Secondary | ICD-10-CM | POA: Diagnosis not present

## 2021-01-01 DIAGNOSIS — J9601 Acute respiratory failure with hypoxia: Secondary | ICD-10-CM | POA: Diagnosis not present

## 2021-01-01 DIAGNOSIS — I714 Abdominal aortic aneurysm, without rupture: Secondary | ICD-10-CM | POA: Diagnosis not present

## 2021-01-01 DIAGNOSIS — G9009 Other idiopathic peripheral autonomic neuropathy: Secondary | ICD-10-CM | POA: Diagnosis not present

## 2021-01-02 DIAGNOSIS — D638 Anemia in other chronic diseases classified elsewhere: Secondary | ICD-10-CM | POA: Diagnosis not present

## 2021-01-02 DIAGNOSIS — I129 Hypertensive chronic kidney disease with stage 1 through stage 4 chronic kidney disease, or unspecified chronic kidney disease: Secondary | ICD-10-CM | POA: Diagnosis not present

## 2021-01-02 DIAGNOSIS — D61818 Other pancytopenia: Secondary | ICD-10-CM | POA: Diagnosis not present

## 2021-01-02 DIAGNOSIS — N183 Chronic kidney disease, stage 3 unspecified: Secondary | ICD-10-CM | POA: Diagnosis not present

## 2021-01-02 DIAGNOSIS — M10361 Gout due to renal impairment, right knee: Secondary | ICD-10-CM | POA: Diagnosis not present

## 2021-01-02 DIAGNOSIS — I482 Chronic atrial fibrillation, unspecified: Secondary | ICD-10-CM | POA: Diagnosis not present

## 2021-01-03 DIAGNOSIS — G629 Polyneuropathy, unspecified: Secondary | ICD-10-CM | POA: Diagnosis not present

## 2021-01-03 DIAGNOSIS — L603 Nail dystrophy: Secondary | ICD-10-CM | POA: Diagnosis not present

## 2021-01-06 DIAGNOSIS — I129 Hypertensive chronic kidney disease with stage 1 through stage 4 chronic kidney disease, or unspecified chronic kidney disease: Secondary | ICD-10-CM | POA: Diagnosis not present

## 2021-01-06 DIAGNOSIS — I482 Chronic atrial fibrillation, unspecified: Secondary | ICD-10-CM | POA: Diagnosis not present

## 2021-01-06 DIAGNOSIS — D638 Anemia in other chronic diseases classified elsewhere: Secondary | ICD-10-CM | POA: Diagnosis not present

## 2021-01-06 DIAGNOSIS — D61818 Other pancytopenia: Secondary | ICD-10-CM | POA: Diagnosis not present

## 2021-01-06 DIAGNOSIS — N183 Chronic kidney disease, stage 3 unspecified: Secondary | ICD-10-CM | POA: Diagnosis not present

## 2021-01-06 DIAGNOSIS — M10361 Gout due to renal impairment, right knee: Secondary | ICD-10-CM | POA: Diagnosis not present

## 2021-01-08 DIAGNOSIS — M10361 Gout due to renal impairment, right knee: Secondary | ICD-10-CM | POA: Diagnosis not present

## 2021-01-08 DIAGNOSIS — D61818 Other pancytopenia: Secondary | ICD-10-CM | POA: Diagnosis not present

## 2021-01-08 DIAGNOSIS — N183 Chronic kidney disease, stage 3 unspecified: Secondary | ICD-10-CM | POA: Diagnosis not present

## 2021-01-08 DIAGNOSIS — I482 Chronic atrial fibrillation, unspecified: Secondary | ICD-10-CM | POA: Diagnosis not present

## 2021-01-08 DIAGNOSIS — D500 Iron deficiency anemia secondary to blood loss (chronic): Secondary | ICD-10-CM | POA: Diagnosis not present

## 2021-01-08 DIAGNOSIS — E782 Mixed hyperlipidemia: Secondary | ICD-10-CM | POA: Diagnosis not present

## 2021-01-08 DIAGNOSIS — G6282 Radiation-induced polyneuropathy: Secondary | ICD-10-CM | POA: Diagnosis not present

## 2021-01-08 DIAGNOSIS — D638 Anemia in other chronic diseases classified elsewhere: Secondary | ICD-10-CM | POA: Diagnosis not present

## 2021-01-08 DIAGNOSIS — I129 Hypertensive chronic kidney disease with stage 1 through stage 4 chronic kidney disease, or unspecified chronic kidney disease: Secondary | ICD-10-CM | POA: Diagnosis not present

## 2021-01-08 DIAGNOSIS — R799 Abnormal finding of blood chemistry, unspecified: Secondary | ICD-10-CM | POA: Diagnosis not present

## 2021-01-08 DIAGNOSIS — J45909 Unspecified asthma, uncomplicated: Secondary | ICD-10-CM | POA: Diagnosis not present

## 2021-01-08 DIAGNOSIS — N184 Chronic kidney disease, stage 4 (severe): Secondary | ICD-10-CM | POA: Diagnosis not present

## 2021-01-08 DIAGNOSIS — I10 Essential (primary) hypertension: Secondary | ICD-10-CM | POA: Diagnosis not present

## 2021-01-08 DIAGNOSIS — R944 Abnormal results of kidney function studies: Secondary | ICD-10-CM | POA: Diagnosis not present

## 2021-01-08 DIAGNOSIS — E039 Hypothyroidism, unspecified: Secondary | ICD-10-CM | POA: Diagnosis not present

## 2021-01-08 DIAGNOSIS — G589 Mononeuropathy, unspecified: Secondary | ICD-10-CM | POA: Diagnosis not present

## 2021-01-09 ENCOUNTER — Ambulatory Visit (HOSPITAL_COMMUNITY)
Admission: RE | Admit: 2021-01-09 | Discharge: 2021-01-09 | Disposition: A | Discharge status: Home / Self Care | Payer: Medicare Other | Source: Ambulatory Visit | Attending: Internal Medicine | Admitting: Internal Medicine

## 2021-01-09 ENCOUNTER — Other Ambulatory Visit: Payer: Self-pay

## 2021-01-09 DIAGNOSIS — Z1231 Encounter for screening mammogram for malignant neoplasm of breast: Secondary | ICD-10-CM | POA: Insufficient documentation

## 2021-01-10 DIAGNOSIS — I482 Chronic atrial fibrillation, unspecified: Secondary | ICD-10-CM | POA: Diagnosis not present

## 2021-01-10 DIAGNOSIS — D638 Anemia in other chronic diseases classified elsewhere: Secondary | ICD-10-CM | POA: Diagnosis not present

## 2021-01-10 DIAGNOSIS — I129 Hypertensive chronic kidney disease with stage 1 through stage 4 chronic kidney disease, or unspecified chronic kidney disease: Secondary | ICD-10-CM | POA: Diagnosis not present

## 2021-01-10 DIAGNOSIS — M10361 Gout due to renal impairment, right knee: Secondary | ICD-10-CM | POA: Diagnosis not present

## 2021-01-10 DIAGNOSIS — D61818 Other pancytopenia: Secondary | ICD-10-CM | POA: Diagnosis not present

## 2021-01-10 DIAGNOSIS — N183 Chronic kidney disease, stage 3 unspecified: Secondary | ICD-10-CM | POA: Diagnosis not present

## 2021-01-13 ENCOUNTER — Encounter (HOSPITAL_COMMUNITY): Payer: Self-pay | Admitting: Hematology

## 2021-01-13 ENCOUNTER — Other Ambulatory Visit: Payer: Self-pay

## 2021-01-13 ENCOUNTER — Inpatient Hospital Stay (HOSPITAL_BASED_OUTPATIENT_CLINIC_OR_DEPARTMENT_OTHER): Payer: Medicare Other | Admitting: Hematology

## 2021-01-13 ENCOUNTER — Inpatient Hospital Stay (HOSPITAL_COMMUNITY): Payer: Medicare Other | Attending: Hematology

## 2021-01-13 ENCOUNTER — Encounter (HOSPITAL_COMMUNITY): Payer: Self-pay

## 2021-01-13 VITALS — BP 122/65 | HR 83 | Temp 97.2°F | Resp 16 | Wt 180.5 lb

## 2021-01-13 DIAGNOSIS — Z87891 Personal history of nicotine dependence: Secondary | ICD-10-CM | POA: Diagnosis not present

## 2021-01-13 DIAGNOSIS — Z87440 Personal history of urinary (tract) infections: Secondary | ICD-10-CM | POA: Diagnosis not present

## 2021-01-13 DIAGNOSIS — C50912 Malignant neoplasm of unspecified site of left female breast: Secondary | ICD-10-CM

## 2021-01-13 DIAGNOSIS — Z809 Family history of malignant neoplasm, unspecified: Secondary | ICD-10-CM | POA: Diagnosis not present

## 2021-01-13 DIAGNOSIS — Z79899 Other long term (current) drug therapy: Secondary | ICD-10-CM | POA: Insufficient documentation

## 2021-01-13 DIAGNOSIS — Z923 Personal history of irradiation: Secondary | ICD-10-CM | POA: Insufficient documentation

## 2021-01-13 DIAGNOSIS — Z8719 Personal history of other diseases of the digestive system: Secondary | ICD-10-CM | POA: Diagnosis not present

## 2021-01-13 DIAGNOSIS — E039 Hypothyroidism, unspecified: Secondary | ICD-10-CM | POA: Insufficient documentation

## 2021-01-13 DIAGNOSIS — G629 Polyneuropathy, unspecified: Secondary | ICD-10-CM | POA: Insufficient documentation

## 2021-01-13 DIAGNOSIS — R0602 Shortness of breath: Secondary | ICD-10-CM | POA: Diagnosis not present

## 2021-01-13 DIAGNOSIS — E871 Hypo-osmolality and hyponatremia: Secondary | ICD-10-CM | POA: Insufficient documentation

## 2021-01-13 DIAGNOSIS — M1990 Unspecified osteoarthritis, unspecified site: Secondary | ICD-10-CM | POA: Insufficient documentation

## 2021-01-13 DIAGNOSIS — I10 Essential (primary) hypertension: Secondary | ICD-10-CM | POA: Insufficient documentation

## 2021-01-13 DIAGNOSIS — D61818 Other pancytopenia: Secondary | ICD-10-CM | POA: Diagnosis not present

## 2021-01-13 DIAGNOSIS — C9310 Chronic myelomonocytic leukemia not having achieved remission: Secondary | ICD-10-CM | POA: Diagnosis not present

## 2021-01-13 DIAGNOSIS — Z888 Allergy status to other drugs, medicaments and biological substances status: Secondary | ICD-10-CM | POA: Diagnosis not present

## 2021-01-13 DIAGNOSIS — D469 Myelodysplastic syndrome, unspecified: Secondary | ICD-10-CM

## 2021-01-13 LAB — COMPREHENSIVE METABOLIC PANEL
ALT: 10 U/L (ref 0–44)
AST: 25 U/L (ref 15–41)
Albumin: 3.1 g/dL — ABNORMAL LOW (ref 3.5–5.0)
Alkaline Phosphatase: 42 U/L (ref 38–126)
Anion gap: 8 (ref 5–15)
BUN: 9 mg/dL (ref 8–23)
CO2: 26 mmol/L (ref 22–32)
Calcium: 9.1 mg/dL (ref 8.9–10.3)
Chloride: 96 mmol/L — ABNORMAL LOW (ref 98–111)
Creatinine, Ser: 0.9 mg/dL (ref 0.44–1.00)
GFR, Estimated: >60 mL/min (ref >60)
Glucose, Bld: 88 mg/dL (ref 70–99)
Potassium: 4.7 mmol/L (ref 3.5–5.1)
Sodium: 130 mmol/L — ABNORMAL LOW (ref 135–145)
Total Bilirubin: 0.8 mg/dL (ref 0.3–1.2)
Total Protein: 8.9 g/dL — ABNORMAL HIGH (ref 6.5–8.1)

## 2021-01-13 LAB — CBC WITH DIFFERENTIAL/PLATELET
Band Neutrophils: 1 %
Basophils Absolute: 0 10*3/uL (ref 0.0–0.1)
Basophils Relative: 0 %
Eosinophils Absolute: 0 10*3/uL (ref 0.0–0.5)
Eosinophils Relative: 0 %
HCT: 34.7 % — ABNORMAL LOW (ref 36.0–46.0)
Hemoglobin: 9.5 g/dL — ABNORMAL LOW (ref 12.0–15.0)
Lymphocytes Relative: 67 %
Lymphs Abs: 1.5 10*3/uL (ref 0.7–4.0)
MCH: 27.4 pg (ref 26.0–34.0)
MCHC: 27.4 g/dL — ABNORMAL LOW (ref 30.0–36.0)
MCV: 100 fL (ref 80.0–100.0)
Metamyelocytes Relative: 6 %
Monocytes Absolute: 0.4 10*3/uL (ref 0.1–1.0)
Monocytes Relative: 19 %
Neutro Abs: 0.2 10*3/uL — CL (ref 1.7–7.7)
Neutrophils Relative %: 7 %
Platelets: 53 10*3/uL — ABNORMAL LOW (ref 150–400)
RBC: 3.47 MIL/uL — ABNORMAL LOW (ref 3.87–5.11)
RDW: 19.6 % — ABNORMAL HIGH (ref 11.5–15.5)
WBC: 2.2 10*3/uL — ABNORMAL LOW (ref 4.0–10.5)
nRBC: 10.6 % — ABNORMAL HIGH (ref 0.0–0.2)

## 2021-01-13 NOTE — Progress Notes (Signed)
[place], [place] [address]   CLINIC:  Medical Oncology/Hematology  PCP:  Celene Squibb, MD [address] [place] [address]  [phone]  REASON FOR VISIT:  Follow-up for MDS and left breast cancer  PRIOR THERAPY: Lumpectomy and XRT in 1999, followed by tamoxifen for 5 days  CURRENT THERAPY: Observation  INTERVAL HISTORY:  [name], a 87 y.o. female, returns for routine follow-up for her MDS and left breast cancer. Lisa [name] was last seen on 12/12/2020.  Today she is accompanied by her son and she reports feeling fair. According to her son, her energy levels and strength have improved since she started doing PT. She is able to ambulate with a cane and walker, though she gets SOB easily with even mild activity; she is also able to take the stairs with a cane. She took Cipro for a recent UTI and finished the course around 03/07; her last UTI was several years ago.  She was recently seen by rheumatology and was cleared for any autoimmune diseases. She has a follow-up with rheumatology on 03/17.   REVIEW OF SYSTEMS:  Review of Systems  Constitutional: Positive for appetite change (50%).  Respiratory: Positive for shortness of breath (w/ exertion).   All other systems reviewed and are negative.   PAST MEDICAL/SURGICAL HISTORY:  Past Medical History:  Diagnosis Date  . Adenocarcinoma of left lung ([facility] [facility]) 2006  . Arthritis   . Asthma   . Cancer of breast, female ([place])   . Cancer of lung ([facility])   . Hypertension   . Hypothyroidism   . Invasive ductal carcinoma of left breast ([place]) 1999  . Neutropenia ([facility]) 06/09/2016  . Personal history of radiation therapy    Past Surgical History:  Procedure Laterality Date  . BIOPSY  10/23/2020   Procedure: BIOPSY;  Surgeon: Yetta Flock, MD;  Location: [facility] ENDOSCOPY;  Service: Gastroenterology;;  . BREAST BIOPSY     left axillary node dissection  . BREAST LUMPECTOMY Left   .  COLONOSCOPY  03/08/2003   CNO:[id number] rectal polyps destroyed with the tip of the snare/Polyps at hepatic flexure, splenic flexure at 35 cm/Left-sided diverticula: unable to retrieve path  . COLONOSCOPY  09/12/2008   HUT:MLYYTK rectum and distal sigmoid diminutive polyps/scattered left sided diverticulum. hyperplastic  . COLONOSCOPY N/A 03/06/2013   PTW:SFKCLEX polyp-removed as described above; colonic diverticulosis. hyperplastic polyps. next TCS 03/2018  . ESOPHAGOGASTRODUODENOSCOPY (EGD) WITH PROPOFOL N/A 10/23/2020   Procedure: ESOPHAGOGASTRODUODENOSCOPY (EGD) WITH PROPOFOL;  Surgeon: Yetta Flock, MD;  Location: [facility];  Service: Gastroenterology;  Laterality: N/A;  . FOOT SURGERY    . LUNG REMOVAL, PARTIAL     upper lobe    SOCIAL HISTORY:  Social History   Socioeconomic History  . Marital status: Widowed    Spouse name: Not on file  . Number of children: Not on file  . Years of education: Not on file  . Highest education level: Not on file  Occupational History  . Not on file  Tobacco Use  . Smoking status: Former Smoker    Packs/day: 0.75    Years: 35.00    Pack years: 26.25    Quit date: 1993    Years since quitting: 29.2  . Smokeless tobacco: Never Used  . Tobacco comment: smoke-free X 30 yeras  Vaping Use  . Vaping Use: Never used  Substance and Sexual Activity  . Alcohol use: No  . Drug use:  No  . Sexual activity: Not Currently  Other Topics Concern  . Not on file  Social History Narrative  . Not on file   Social Determinants of Health   Financial Resource Strain: Not on file  Food Insecurity: Not on file  Transportation Needs: No Transportation Needs  . Lack of Transportation (Medical): No  . Lack of Transportation (Non-Medical): No  Physical Activity: Inactive  . Days of Exercise per Week: 0 days  . Minutes of Exercise per Session: 0 min  Stress: Not on file  Social Connections: Not on file  Intimate Partner Violence: Not At Risk  .  Fear of Current or Ex-Partner: No  . Emotionally Abused: No  . Physically Abused: No  . Sexually Abused: No    FAMILY HISTORY:  Family History  Problem Relation Age of Onset  . Cancer Mother   . Cancer Sister   . Colon cancer Neg Hx     CURRENT MEDICATIONS:  Current Outpatient Medications  Medication Sig Dispense Refill  . umeclidinium bromide (INCRUSE ELLIPTA) 62.5 MCG/INH AEPB Inhale into the lungs daily.    Marland Kitchen acetaminophen (TYLENOL) 325 MG tablet Take 2 tablets (650 mg total) by mouth every 6 (six) hours.    Marland Kitchen albuterol (PROVENTIL,VENTOLIN) 90 MCG/ACT inhaler Inhale 2 puffs into the lungs every 6 (six) hours as needed for wheezing or shortness of breath. 17 g 12  . Iron-FA-B Cmp-C-Biot-Probiotic (FUSION PLUS) CAPS Take 1 capsule by mouth daily.    Marland Kitchen levothyroxine (SYNTHROID) 100 MCG tablet Take 1 tablet (100 mcg total) by mouth daily at 6 (six) AM. 90 tablet 0  . metoprolol succinate (TOPROL-XL) 50 MG 24 hr tablet Take 50 mg by mouth daily.    . pantoprazole (PROTONIX) 40 MG tablet Take 1 tablet (40 mg total) by mouth daily. 30 tablet 0  . polyethylene glycol (MIRALAX / GLYCOLAX) 17 g packet Take 17 g by mouth daily as needed for moderate constipation. 14 each 0  . senna-docusate (SENOKOT-S) 8.6-50 MG tablet Take 1 tablet by mouth at bedtime.    . traMADol (ULTRAM) 50 MG tablet Take 1 tablet (50 mg total) by mouth every 6 (six) hours as needed for moderate pain. 30 tablet 0  . traZODone (DESYREL) 50 MG tablet Take 1 tablet (50 mg total) by mouth at bedtime. 30 tablet 0  . Vitamin D, Ergocalciferol, (DRISDOL) 1.25 MG (50000 UNIT) CAPS capsule Take 50,000 Units by mouth once a week.     No current facility-administered medications for this visit.    ALLERGIES:  Allergies  Allergen Reactions  . Meloxicam Other (See Comments)    Caused an injury to the kidneys, per nephrologist  . Micardis Hct [Telmisartan-Hctz] Other (See Comments)    Caused an injury to the kidneys, per  nephrologist    PHYSICAL EXAM:  Performance status (ECOG): 3 - Symptomatic, >50% confined to bed  Vitals:   01/13/21 1145  BP: 122/65  Pulse: 83  Resp: 16  Temp: (!) 97.2 F (36.2 C)  SpO2: 94%   Wt Readings from Last 3 Encounters:  01/13/21 180 lb 8 oz (81.9 kg)  12/12/20 157 lb 5 oz (71.4 kg)  11/19/20 196 lb 8 oz (89.1 kg)   Physical Exam Vitals reviewed.  Constitutional:      Appearance: Normal appearance.     Comments: In wheelchair  Cardiovascular:     Rate and Rhythm: Normal rate and regular rhythm.     Pulses: Normal pulses.     Heart  sounds: Normal heart sounds.  Pulmonary:     Effort: Pulmonary effort is normal.     Breath sounds: Normal breath sounds.  Chest:  Breasts:     Right: No axillary adenopathy or supraclavicular adenopathy.     Left: No axillary adenopathy or supraclavicular adenopathy.    Abdominal:     Palpations: Abdomen is soft. There is no mass.     Tenderness: There is no abdominal tenderness.  Lymphadenopathy:     Cervical: No cervical adenopathy.     Upper Body:     Right upper body: No supraclavicular, axillary or pectoral adenopathy.     Left upper body: No supraclavicular, axillary or pectoral adenopathy.  Neurological:     General: No focal deficit present.     Mental Status: She is alert and oriented to person, place, and time.  Psychiatric:        Mood and Affect: Mood normal.        Behavior: Behavior normal.     LABORATORY DATA:  I have reviewed the labs as listed.  CBC Latest Ref Rng & Units 01/13/2021 12/12/2020 11/19/2020  WBC 4.0 - 10.5 K/uL 2.2(L) 2.8(L) 2.2(L)  Hemoglobin 12.0 - 15.0 g/dL 9.5(L) 9.5(L) 9.9(L)  Hematocrit 36.0 - 46.0 % 34.7(L) 34.2(L) 34.5(L)  Platelets 150 - 400 K/uL 53(L) 88(L) 84(L)   CMP Latest Ref Rng & Units 01/13/2021 12/12/2020 12/06/2020  Glucose 70 - 99 mg/dL 88 100(H) 99  BUN 8 - 23 mg/dL _0 Creatinine 0.44 - 1.00 mg/dL 0.90 0.92 0.93  Sodium 135 - 145 mmol/L 130(L) 127(L) 130(L)   Potassium 3.5 - 5.1 mmol/L 4.7 4.2 4.4  Chloride 98 - 111 mmol/L 96(L) 92(L) 94(L)  CO2 22 - 32 mmol/L _1 Calcium 8.9 - 10.3 mg/dL 9.1 9.0 8.8(L)  Total Protein 6.5 - 8.1 g/dL 8.9(H) 8.8(H) -  Total Bilirubin 0.3 - 1.2 mg/dL 0.8 0.9 -  Alkaline Phos 38 - 126 U/L 42 45 -  AST 15 - 41 U/L 25 20 -  ALT 0 - 44 U/L 10 9 -      Component Value Date/Time   RBC 3.47 (L) 01/13/2021 1047   MCV 100.0 01/13/2021 1047   MCH 27.4 01/13/2021 1047   MCHC 27.4 (L) 01/13/2021 1047   RDW 19.6 (H) 01/13/2021 1047   LYMPHSABS 1.5 01/13/2021 1047   MONOABS 0.4 01/13/2021 1047   EOSABS 0.0 01/13/2021 1047   BASOSABS 0.0 01/13/2021 1047    DIAGNOSTIC IMAGING:  I have independently reviewed the scans and discussed with the patient. No results found.   ASSESSMENT:  1. 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 [place] Crane for pancytopenia. Subsequently her white count has recovered along with recovery of the hemoglobin. -Bone marrow biopsy on 11/06/2020 shows hypercellular marrow with trilineage dyspoiesis and polytypic plasmacytosis. Primary differential includes CMML and other MDS. Definitive blast population is not identified morphologically or by flow. There is a increased immature mononuclear cells favored to be monocytic in origin. Megakaryocytes are frequently hypolobated and hyperchromatic without clustering. Atypical appearing erythroid precursors. Increased plasma cells 5 to 9% which are polytypic by kappa and lambda staining. -Chromosome analysis shows 46,X, idiocentric X chromosome[9]/46, XX[11]. -MDS FISH panel was negative. -Revised IPSS score of 3.  In the absence of treatment, median survival of 5.3 years and 25% AML progression is 10.8 years.  Low risk based on revised IPSS classification. -SPEP, methylmalonic acid, ferritin levels were normal.  2. Stage  I left breast cancer: -Diagnosed in Sep 1999, treated with lumpectomy and XRT. ER/PR was positive and  received tamoxifen for 5 years. -Mammogram on 2019-10-30 was BI-RADS Category 2. Physical exam did not reveal any palpable masses today.  3. Stage I adenocarcinoma of the left upper lobe of the lung: -Left upper lobectomy on 2005-06-01. CT chest on 2018-06-09 was stable. -CT CAP on 10/24/2020 shows stable postsurgical changes from the partial left upper lobe resection. No other signs of malignancy.   PLAN:  1. Low riskMDS/CMML: -She has been doing better in terms of mobility.  She is doing physical therapy and improving functional status very well. -She reportedly took ciprofloxacin until last week for UTI. -Reviewed labs from today which showed white count is 2.2 and down from 2.8 previously.  Platelet count is 53 down from 88 previously.  Hemoglobin stable at 9.5.  Creatinine is normal at 0.9. -We have sent a serum EPO level which is pending. -I think slight worsening of leukopenia and thrombocytopenia secondary to ciprofloxacin. -No indication for treatment with hypomethylating agents given her functional status. -RTC 2 months for follow-up with repeat labs.  I will plan to repeat ferritin, iron panel, G88 and folic acid along with routine labs.  2. Peripheral neuropathy: -Continue gabapentin.  3.  Hyponatremia: -She became hyponatremic during hospitalization, thought to be secondary to SIADH. -Today sodium level improved to 130.  Orders placed this encounter:  No orders of the defined types were placed in this encounter.    Derek Jack, MD [place] [phone]   I, Milinda Antis, am acting as a scribe for Dr. Sanda Linger.  I, Derek Jack MD, have reviewed the above documentation for accuracy and completeness, and I agree with the above.

## 2021-01-13 NOTE — Patient Instructions (Signed)
[facility] at [facility] Discharge Instructions  You were seen today by Dr. Katragadda. He went over your recent results. Dr. Katragadda will see you back in 2 months for labs and follow up.   Thank you for choosing [facility] at [facility] to provide your oncology and hematology care.  To afford each patient quality time with our provider, please arrive at least 15 minutes before your scheduled appointment time.   If you have a lab appointment with the Cancer Center please come in thru the Main Entrance and check in at the main information desk  You need to re-schedule your appointment should you arrive 10 or more minutes late.  We strive to give you quality time with our providers, and arriving late affects you and other patients whose appointments are after yours.  Also, if you no show three or more times for appointments you may be dismissed from the clinic at the providers discretion.     Again, thank you for choosing [facility].  Our hope is that these requests will decrease the amount of time that you wait before being seen by our physicians.       _____________________________________________________________  Should you have questions after your visit to [facility], please contact our office at [phone] between the hours of 8:00 a.m. and 4:30 p.m.  Voicemails left after 4:00 p.m. will not be returned until the following business day.  For prescription refill requests, have your pharmacy contact our office and allow 72 hours.    Cancer Center Support Programs:   > Cancer Support Group  2nd Tuesday of the month 1pm-2pm, Journey Room   

## 2021-01-13 NOTE — Progress Notes (Signed)
CRITICAL VALUE STICKER  CRITICAL VALUE: ANC 0.2  DATE & TIME NOTIFIED: 01/13/2021 1210  MD NOTIFIED: Delton Coombes, [place]: 1212

## 2021-01-14 DIAGNOSIS — I129 Hypertensive chronic kidney disease with stage 1 through stage 4 chronic kidney disease, or unspecified chronic kidney disease: Secondary | ICD-10-CM | POA: Diagnosis not present

## 2021-01-14 DIAGNOSIS — I482 Chronic atrial fibrillation, unspecified: Secondary | ICD-10-CM | POA: Diagnosis not present

## 2021-01-14 DIAGNOSIS — D61818 Other pancytopenia: Secondary | ICD-10-CM | POA: Diagnosis not present

## 2021-01-14 DIAGNOSIS — D638 Anemia in other chronic diseases classified elsewhere: Secondary | ICD-10-CM | POA: Diagnosis not present

## 2021-01-14 DIAGNOSIS — N183 Chronic kidney disease, stage 3 unspecified: Secondary | ICD-10-CM | POA: Diagnosis not present

## 2021-01-14 DIAGNOSIS — M10361 Gout due to renal impairment, right knee: Secondary | ICD-10-CM | POA: Diagnosis not present

## 2021-01-14 LAB — ERYTHROPOIETIN: Erythropoietin: 87.4 m[IU]/mL — ABNORMAL HIGH (ref 2.6–18.5)

## 2021-01-16 DIAGNOSIS — R7989 Other specified abnormal findings of blood chemistry: Secondary | ICD-10-CM | POA: Diagnosis not present

## 2021-01-16 DIAGNOSIS — R300 Dysuria: Secondary | ICD-10-CM | POA: Diagnosis not present

## 2021-01-16 DIAGNOSIS — M150 Primary generalized (osteo)arthritis: Secondary | ICD-10-CM | POA: Diagnosis not present

## 2021-01-16 DIAGNOSIS — R29898 Other symptoms and signs involving the musculoskeletal system: Secondary | ICD-10-CM | POA: Diagnosis not present

## 2021-01-16 DIAGNOSIS — R627 Adult failure to thrive: Secondary | ICD-10-CM | POA: Diagnosis not present

## 2021-01-16 DIAGNOSIS — Z6822 Body mass index (BMI) 22.0-22.9, adult: Secondary | ICD-10-CM | POA: Diagnosis not present

## 2021-01-16 DIAGNOSIS — E790 Hyperuricemia without signs of inflammatory arthritis and tophaceous disease: Secondary | ICD-10-CM | POA: Diagnosis not present

## 2021-01-16 DIAGNOSIS — D469 Myelodysplastic syndrome, unspecified: Secondary | ICD-10-CM | POA: Diagnosis not present

## 2021-01-16 DIAGNOSIS — M2550 Pain in unspecified joint: Secondary | ICD-10-CM | POA: Diagnosis not present

## 2021-01-17 DIAGNOSIS — I129 Hypertensive chronic kidney disease with stage 1 through stage 4 chronic kidney disease, or unspecified chronic kidney disease: Secondary | ICD-10-CM | POA: Diagnosis not present

## 2021-01-17 DIAGNOSIS — M10361 Gout due to renal impairment, right knee: Secondary | ICD-10-CM | POA: Diagnosis not present

## 2021-01-17 DIAGNOSIS — N183 Chronic kidney disease, stage 3 unspecified: Secondary | ICD-10-CM | POA: Diagnosis not present

## 2021-01-17 DIAGNOSIS — D638 Anemia in other chronic diseases classified elsewhere: Secondary | ICD-10-CM | POA: Diagnosis not present

## 2021-01-17 DIAGNOSIS — D61818 Other pancytopenia: Secondary | ICD-10-CM | POA: Diagnosis not present

## 2021-01-17 DIAGNOSIS — I482 Chronic atrial fibrillation, unspecified: Secondary | ICD-10-CM | POA: Diagnosis not present

## 2021-01-18 DIAGNOSIS — J9611 Chronic respiratory failure with hypoxia: Secondary | ICD-10-CM | POA: Diagnosis not present

## 2021-01-18 DIAGNOSIS — J441 Chronic obstructive pulmonary disease with (acute) exacerbation: Secondary | ICD-10-CM | POA: Diagnosis not present

## 2021-01-18 DIAGNOSIS — D72819 Decreased white blood cell count, unspecified: Secondary | ICD-10-CM | POA: Diagnosis not present

## 2021-01-20 DIAGNOSIS — N183 Chronic kidney disease, stage 3 unspecified: Secondary | ICD-10-CM | POA: Diagnosis not present

## 2021-01-20 DIAGNOSIS — M25562 Pain in left knee: Secondary | ICD-10-CM | POA: Diagnosis not present

## 2021-01-20 DIAGNOSIS — E669 Obesity, unspecified: Secondary | ICD-10-CM | POA: Diagnosis not present

## 2021-01-20 DIAGNOSIS — Z853 Personal history of malignant neoplasm of breast: Secondary | ICD-10-CM | POA: Diagnosis not present

## 2021-01-20 DIAGNOSIS — M10361 Gout due to renal impairment, right knee: Secondary | ICD-10-CM | POA: Diagnosis not present

## 2021-01-20 DIAGNOSIS — I129 Hypertensive chronic kidney disease with stage 1 through stage 4 chronic kidney disease, or unspecified chronic kidney disease: Secondary | ICD-10-CM | POA: Diagnosis not present

## 2021-01-20 DIAGNOSIS — Z87891 Personal history of nicotine dependence: Secondary | ICD-10-CM | POA: Diagnosis not present

## 2021-01-20 DIAGNOSIS — E039 Hypothyroidism, unspecified: Secondary | ICD-10-CM | POA: Diagnosis not present

## 2021-01-20 DIAGNOSIS — I482 Chronic atrial fibrillation, unspecified: Secondary | ICD-10-CM | POA: Diagnosis not present

## 2021-01-20 DIAGNOSIS — Z85118 Personal history of other malignant neoplasm of bronchus and lung: Secondary | ICD-10-CM | POA: Diagnosis not present

## 2021-01-20 DIAGNOSIS — D638 Anemia in other chronic diseases classified elsewhere: Secondary | ICD-10-CM | POA: Diagnosis not present

## 2021-01-20 DIAGNOSIS — M25561 Pain in right knee: Secondary | ICD-10-CM | POA: Diagnosis not present

## 2021-01-20 DIAGNOSIS — B9681 Helicobacter pylori [H. pylori] as the cause of diseases classified elsewhere: Secondary | ICD-10-CM | POA: Diagnosis not present

## 2021-01-20 DIAGNOSIS — J45909 Unspecified asthma, uncomplicated: Secondary | ICD-10-CM | POA: Diagnosis not present

## 2021-01-20 DIAGNOSIS — M10362 Gout due to renal impairment, left knee: Secondary | ICD-10-CM | POA: Diagnosis not present

## 2021-01-20 DIAGNOSIS — D61818 Other pancytopenia: Secondary | ICD-10-CM | POA: Diagnosis not present

## 2021-01-23 DIAGNOSIS — N183 Chronic kidney disease, stage 3 unspecified: Secondary | ICD-10-CM | POA: Diagnosis not present

## 2021-01-23 DIAGNOSIS — D61818 Other pancytopenia: Secondary | ICD-10-CM | POA: Diagnosis not present

## 2021-01-23 DIAGNOSIS — M10361 Gout due to renal impairment, right knee: Secondary | ICD-10-CM | POA: Diagnosis not present

## 2021-01-23 DIAGNOSIS — I482 Chronic atrial fibrillation, unspecified: Secondary | ICD-10-CM | POA: Diagnosis not present

## 2021-01-23 DIAGNOSIS — D638 Anemia in other chronic diseases classified elsewhere: Secondary | ICD-10-CM | POA: Diagnosis not present

## 2021-01-23 DIAGNOSIS — I129 Hypertensive chronic kidney disease with stage 1 through stage 4 chronic kidney disease, or unspecified chronic kidney disease: Secondary | ICD-10-CM | POA: Diagnosis not present

## 2021-01-27 DIAGNOSIS — N183 Chronic kidney disease, stage 3 unspecified: Secondary | ICD-10-CM | POA: Diagnosis not present

## 2021-01-27 DIAGNOSIS — I482 Chronic atrial fibrillation, unspecified: Secondary | ICD-10-CM | POA: Diagnosis not present

## 2021-01-27 DIAGNOSIS — I129 Hypertensive chronic kidney disease with stage 1 through stage 4 chronic kidney disease, or unspecified chronic kidney disease: Secondary | ICD-10-CM | POA: Diagnosis not present

## 2021-01-27 DIAGNOSIS — D638 Anemia in other chronic diseases classified elsewhere: Secondary | ICD-10-CM | POA: Diagnosis not present

## 2021-01-27 DIAGNOSIS — D61818 Other pancytopenia: Secondary | ICD-10-CM | POA: Diagnosis not present

## 2021-01-27 DIAGNOSIS — M10361 Gout due to renal impairment, right knee: Secondary | ICD-10-CM | POA: Diagnosis not present

## 2021-01-28 ENCOUNTER — Encounter: Payer: Self-pay | Admitting: Gastroenterology

## 2021-01-31 DIAGNOSIS — N183 Chronic kidney disease, stage 3 unspecified: Secondary | ICD-10-CM | POA: Diagnosis not present

## 2021-01-31 DIAGNOSIS — M10362 Gout due to renal impairment, left knee: Secondary | ICD-10-CM | POA: Diagnosis not present

## 2021-01-31 DIAGNOSIS — D61818 Other pancytopenia: Secondary | ICD-10-CM | POA: Diagnosis not present

## 2021-01-31 DIAGNOSIS — Z853 Personal history of malignant neoplasm of breast: Secondary | ICD-10-CM

## 2021-01-31 DIAGNOSIS — B9681 Helicobacter pylori [H. pylori] as the cause of diseases classified elsewhere: Secondary | ICD-10-CM | POA: Diagnosis not present

## 2021-01-31 DIAGNOSIS — J45909 Unspecified asthma, uncomplicated: Secondary | ICD-10-CM | POA: Diagnosis not present

## 2021-01-31 DIAGNOSIS — I482 Chronic atrial fibrillation, unspecified: Secondary | ICD-10-CM | POA: Diagnosis not present

## 2021-01-31 DIAGNOSIS — M25562 Pain in left knee: Secondary | ICD-10-CM | POA: Diagnosis not present

## 2021-01-31 DIAGNOSIS — Z87891 Personal history of nicotine dependence: Secondary | ICD-10-CM

## 2021-01-31 DIAGNOSIS — Z85118 Personal history of other malignant neoplasm of bronchus and lung: Secondary | ICD-10-CM

## 2021-01-31 DIAGNOSIS — D638 Anemia in other chronic diseases classified elsewhere: Secondary | ICD-10-CM | POA: Diagnosis not present

## 2021-01-31 DIAGNOSIS — M25561 Pain in right knee: Secondary | ICD-10-CM | POA: Diagnosis not present

## 2021-01-31 DIAGNOSIS — E669 Obesity, unspecified: Secondary | ICD-10-CM

## 2021-01-31 DIAGNOSIS — E039 Hypothyroidism, unspecified: Secondary | ICD-10-CM | POA: Diagnosis not present

## 2021-01-31 DIAGNOSIS — M10361 Gout due to renal impairment, right knee: Secondary | ICD-10-CM | POA: Diagnosis not present

## 2021-01-31 DIAGNOSIS — I129 Hypertensive chronic kidney disease with stage 1 through stage 4 chronic kidney disease, or unspecified chronic kidney disease: Secondary | ICD-10-CM | POA: Diagnosis not present

## 2021-02-05 DIAGNOSIS — N183 Chronic kidney disease, stage 3 unspecified: Secondary | ICD-10-CM | POA: Diagnosis not present

## 2021-02-05 DIAGNOSIS — D61818 Other pancytopenia: Secondary | ICD-10-CM | POA: Diagnosis not present

## 2021-02-05 DIAGNOSIS — D638 Anemia in other chronic diseases classified elsewhere: Secondary | ICD-10-CM | POA: Diagnosis not present

## 2021-02-05 DIAGNOSIS — I482 Chronic atrial fibrillation, unspecified: Secondary | ICD-10-CM | POA: Diagnosis not present

## 2021-02-05 DIAGNOSIS — I129 Hypertensive chronic kidney disease with stage 1 through stage 4 chronic kidney disease, or unspecified chronic kidney disease: Secondary | ICD-10-CM | POA: Diagnosis not present

## 2021-02-05 DIAGNOSIS — M10361 Gout due to renal impairment, right knee: Secondary | ICD-10-CM | POA: Diagnosis not present

## 2021-02-06 ENCOUNTER — Ambulatory Visit: Payer: Medicare Other | Admitting: Internal Medicine

## 2021-02-06 DIAGNOSIS — D61818 Other pancytopenia: Secondary | ICD-10-CM | POA: Diagnosis not present

## 2021-02-06 DIAGNOSIS — D638 Anemia in other chronic diseases classified elsewhere: Secondary | ICD-10-CM | POA: Diagnosis not present

## 2021-02-06 DIAGNOSIS — I482 Chronic atrial fibrillation, unspecified: Secondary | ICD-10-CM | POA: Diagnosis not present

## 2021-02-06 DIAGNOSIS — I129 Hypertensive chronic kidney disease with stage 1 through stage 4 chronic kidney disease, or unspecified chronic kidney disease: Secondary | ICD-10-CM | POA: Diagnosis not present

## 2021-02-06 DIAGNOSIS — N183 Chronic kidney disease, stage 3 unspecified: Secondary | ICD-10-CM | POA: Diagnosis not present

## 2021-02-06 DIAGNOSIS — M10361 Gout due to renal impairment, right knee: Secondary | ICD-10-CM | POA: Diagnosis not present

## 2021-02-13 DIAGNOSIS — I700 Atherosclerosis of aorta: Secondary | ICD-10-CM | POA: Diagnosis not present

## 2021-02-13 DIAGNOSIS — I480 Paroxysmal atrial fibrillation: Secondary | ICD-10-CM | POA: Diagnosis not present

## 2021-02-13 DIAGNOSIS — G9009 Other idiopathic peripheral autonomic neuropathy: Secondary | ICD-10-CM | POA: Diagnosis not present

## 2021-02-13 DIAGNOSIS — N1831 Chronic kidney disease, stage 3a: Secondary | ICD-10-CM | POA: Diagnosis not present

## 2021-02-13 DIAGNOSIS — J441 Chronic obstructive pulmonary disease with (acute) exacerbation: Secondary | ICD-10-CM | POA: Diagnosis not present

## 2021-02-13 DIAGNOSIS — D649 Anemia, unspecified: Secondary | ICD-10-CM | POA: Diagnosis not present

## 2021-02-13 DIAGNOSIS — J9601 Acute respiratory failure with hypoxia: Secondary | ICD-10-CM | POA: Diagnosis not present

## 2021-02-13 DIAGNOSIS — I714 Abdominal aortic aneurysm, without rupture: Secondary | ICD-10-CM | POA: Diagnosis not present

## 2021-02-13 DIAGNOSIS — Z712 Person consulting for explanation of examination or test findings: Secondary | ICD-10-CM | POA: Diagnosis not present

## 2021-02-13 DIAGNOSIS — D519 Vitamin B12 deficiency anemia, unspecified: Secondary | ICD-10-CM | POA: Diagnosis not present

## 2021-02-13 DIAGNOSIS — Z853 Personal history of malignant neoplasm of breast: Secondary | ICD-10-CM | POA: Diagnosis not present

## 2021-02-13 DIAGNOSIS — D509 Iron deficiency anemia, unspecified: Secondary | ICD-10-CM | POA: Diagnosis not present

## 2021-02-13 DIAGNOSIS — R531 Weakness: Secondary | ICD-10-CM | POA: Diagnosis not present

## 2021-02-13 DIAGNOSIS — Z85118 Personal history of other malignant neoplasm of bronchus and lung: Secondary | ICD-10-CM | POA: Diagnosis not present

## 2021-02-13 DIAGNOSIS — R634 Abnormal weight loss: Secondary | ICD-10-CM | POA: Diagnosis not present

## 2021-02-13 DIAGNOSIS — D508 Other iron deficiency anemias: Secondary | ICD-10-CM | POA: Diagnosis not present

## 2021-02-13 DIAGNOSIS — E039 Hypothyroidism, unspecified: Secondary | ICD-10-CM | POA: Diagnosis not present

## 2021-02-19 DIAGNOSIS — E669 Obesity, unspecified: Secondary | ICD-10-CM | POA: Diagnosis not present

## 2021-02-19 DIAGNOSIS — M10362 Gout due to renal impairment, left knee: Secondary | ICD-10-CM | POA: Diagnosis not present

## 2021-02-19 DIAGNOSIS — M25561 Pain in right knee: Secondary | ICD-10-CM | POA: Diagnosis not present

## 2021-02-19 DIAGNOSIS — Z85118 Personal history of other malignant neoplasm of bronchus and lung: Secondary | ICD-10-CM | POA: Diagnosis not present

## 2021-02-19 DIAGNOSIS — D638 Anemia in other chronic diseases classified elsewhere: Secondary | ICD-10-CM | POA: Diagnosis not present

## 2021-02-19 DIAGNOSIS — D61818 Other pancytopenia: Secondary | ICD-10-CM | POA: Diagnosis not present

## 2021-02-19 DIAGNOSIS — J45909 Unspecified asthma, uncomplicated: Secondary | ICD-10-CM | POA: Diagnosis not present

## 2021-02-19 DIAGNOSIS — B9681 Helicobacter pylori [H. pylori] as the cause of diseases classified elsewhere: Secondary | ICD-10-CM | POA: Diagnosis not present

## 2021-02-19 DIAGNOSIS — Z87891 Personal history of nicotine dependence: Secondary | ICD-10-CM | POA: Diagnosis not present

## 2021-02-19 DIAGNOSIS — Z853 Personal history of malignant neoplasm of breast: Secondary | ICD-10-CM | POA: Diagnosis not present

## 2021-02-19 DIAGNOSIS — N183 Chronic kidney disease, stage 3 unspecified: Secondary | ICD-10-CM | POA: Diagnosis not present

## 2021-02-19 DIAGNOSIS — I129 Hypertensive chronic kidney disease with stage 1 through stage 4 chronic kidney disease, or unspecified chronic kidney disease: Secondary | ICD-10-CM | POA: Diagnosis not present

## 2021-02-19 DIAGNOSIS — E039 Hypothyroidism, unspecified: Secondary | ICD-10-CM | POA: Diagnosis not present

## 2021-02-19 DIAGNOSIS — I482 Chronic atrial fibrillation, unspecified: Secondary | ICD-10-CM | POA: Diagnosis not present

## 2021-02-19 DIAGNOSIS — M10361 Gout due to renal impairment, right knee: Secondary | ICD-10-CM | POA: Diagnosis not present

## 2021-02-19 DIAGNOSIS — M25562 Pain in left knee: Secondary | ICD-10-CM | POA: Diagnosis not present

## 2021-02-24 ENCOUNTER — Other Ambulatory Visit (HOSPITAL_COMMUNITY): Payer: Medicare Other

## 2021-02-26 ENCOUNTER — Other Ambulatory Visit: Payer: Self-pay

## 2021-02-26 ENCOUNTER — Inpatient Hospital Stay (HOSPITAL_COMMUNITY): Payer: Medicare Other | Attending: Hematology

## 2021-02-26 DIAGNOSIS — G629 Polyneuropathy, unspecified: Secondary | ICD-10-CM | POA: Insufficient documentation

## 2021-02-26 DIAGNOSIS — E871 Hypo-osmolality and hyponatremia: Secondary | ICD-10-CM | POA: Diagnosis not present

## 2021-02-26 DIAGNOSIS — D472 Monoclonal gammopathy: Secondary | ICD-10-CM | POA: Insufficient documentation

## 2021-02-26 DIAGNOSIS — Z809 Family history of malignant neoplasm, unspecified: Secondary | ICD-10-CM | POA: Diagnosis not present

## 2021-02-26 DIAGNOSIS — M10361 Gout due to renal impairment, right knee: Secondary | ICD-10-CM | POA: Diagnosis not present

## 2021-02-26 DIAGNOSIS — Z87891 Personal history of nicotine dependence: Secondary | ICD-10-CM | POA: Diagnosis not present

## 2021-02-26 DIAGNOSIS — D696 Thrombocytopenia, unspecified: Secondary | ICD-10-CM | POA: Diagnosis not present

## 2021-02-26 DIAGNOSIS — N183 Chronic kidney disease, stage 3 unspecified: Secondary | ICD-10-CM | POA: Diagnosis not present

## 2021-02-26 DIAGNOSIS — D469 Myelodysplastic syndrome, unspecified: Secondary | ICD-10-CM

## 2021-02-26 DIAGNOSIS — I482 Chronic atrial fibrillation, unspecified: Secondary | ICD-10-CM | POA: Diagnosis not present

## 2021-02-26 DIAGNOSIS — Z853 Personal history of malignant neoplasm of breast: Secondary | ICD-10-CM | POA: Insufficient documentation

## 2021-02-26 DIAGNOSIS — R630 Anorexia: Secondary | ICD-10-CM | POA: Diagnosis not present

## 2021-02-26 DIAGNOSIS — Z85118 Personal history of other malignant neoplasm of bronchus and lung: Secondary | ICD-10-CM | POA: Insufficient documentation

## 2021-02-26 DIAGNOSIS — D649 Anemia, unspecified: Secondary | ICD-10-CM | POA: Insufficient documentation

## 2021-02-26 DIAGNOSIS — C50912 Malignant neoplasm of unspecified site of left female breast: Secondary | ICD-10-CM

## 2021-02-26 DIAGNOSIS — D61818 Other pancytopenia: Secondary | ICD-10-CM | POA: Diagnosis not present

## 2021-02-26 DIAGNOSIS — I129 Hypertensive chronic kidney disease with stage 1 through stage 4 chronic kidney disease, or unspecified chronic kidney disease: Secondary | ICD-10-CM | POA: Diagnosis not present

## 2021-02-26 DIAGNOSIS — R0602 Shortness of breath: Secondary | ICD-10-CM | POA: Diagnosis not present

## 2021-02-26 DIAGNOSIS — D638 Anemia in other chronic diseases classified elsewhere: Secondary | ICD-10-CM | POA: Diagnosis not present

## 2021-02-26 LAB — CBC WITH DIFFERENTIAL/PLATELET
Band Neutrophils: 5 %
Basophils Absolute: 0 10*3/uL (ref 0.0–0.1)
Basophils Relative: 0 %
Eosinophils Absolute: 0 10*3/uL (ref 0.0–0.5)
Eosinophils Relative: 0 %
HCT: 33.1 % — ABNORMAL LOW (ref 36.0–46.0)
Hemoglobin: 9.4 g/dL — ABNORMAL LOW (ref 12.0–15.0)
Lymphocytes Relative: 78 %
Lymphs Abs: 1.7 10*3/uL (ref 0.7–4.0)
MCH: 28.5 pg (ref 26.0–34.0)
MCHC: 28.4 g/dL — ABNORMAL LOW (ref 30.0–36.0)
MCV: 100.3 fL — ABNORMAL HIGH (ref 80.0–100.0)
Monocytes Absolute: 0.3 10*3/uL (ref 0.1–1.0)
Monocytes Relative: 14 %
Neutro Abs: 0.2 10*3/uL — CL (ref 1.7–7.7)
Neutrophils Relative %: 3 %
Platelets: 49 10*3/uL — ABNORMAL LOW (ref 150–400)
RBC: 3.3 MIL/uL — ABNORMAL LOW (ref 3.87–5.11)
RDW: 20.9 % — ABNORMAL HIGH (ref 11.5–15.5)
WBC: 2.2 10*3/uL — ABNORMAL LOW (ref 4.0–10.5)
nRBC: 7.3 % — ABNORMAL HIGH (ref 0.0–0.2)

## 2021-02-26 LAB — COMPREHENSIVE METABOLIC PANEL
ALT: 8 U/L (ref 0–44)
AST: 22 U/L (ref 15–41)
Albumin: 2.8 g/dL — ABNORMAL LOW (ref 3.5–5.0)
Alkaline Phosphatase: 41 U/L (ref 38–126)
Anion gap: 6 (ref 5–15)
BUN: 12 mg/dL (ref 8–23)
CO2: 26 mmol/L (ref 22–32)
Calcium: 9 mg/dL (ref 8.9–10.3)
Chloride: 100 mmol/L (ref 98–111)
Creatinine, Ser: 0.94 mg/dL (ref 0.44–1.00)
GFR, Estimated: 60 mL/min — ABNORMAL LOW (ref >60)
Glucose, Bld: 107 mg/dL — ABNORMAL HIGH (ref 70–99)
Potassium: 4.1 mmol/L (ref 3.5–5.1)
Sodium: 132 mmol/L — ABNORMAL LOW (ref 135–145)
Total Bilirubin: 0.9 mg/dL (ref 0.3–1.2)
Total Protein: 7.9 g/dL (ref 6.5–8.1)

## 2021-02-26 LAB — IRON AND TIBC
Iron: 28 ug/dL (ref 28–170)
Saturation Ratios: 13 % (ref 10.4–31.8)
TIBC: 218 ug/dL — ABNORMAL LOW (ref 250–450)
UIBC: 190 ug/dL

## 2021-02-26 LAB — VITAMIN B12: Vitamin B-12: 945 pg/mL — ABNORMAL HIGH (ref 180–914)

## 2021-02-26 LAB — LACTATE DEHYDROGENASE: LDH: 294 U/L — ABNORMAL HIGH (ref 98–192)

## 2021-02-26 LAB — FOLATE: Folate: 8.6 ng/mL (ref >5.9)

## 2021-02-26 LAB — FERRITIN: Ferritin: 553 ng/mL — ABNORMAL HIGH (ref 11–307)

## 2021-03-03 ENCOUNTER — Inpatient Hospital Stay (HOSPITAL_COMMUNITY): Payer: Medicare Other | Attending: Hematology | Admitting: Hematology

## 2021-03-03 ENCOUNTER — Other Ambulatory Visit: Payer: Self-pay

## 2021-03-03 VITALS — BP 137/74 | HR 93 | Temp 96.8°F | Resp 18 | Wt 163.2 lb

## 2021-03-03 DIAGNOSIS — R7402 Elevation of levels of lactic acid dehydrogenase (LDH): Secondary | ICD-10-CM | POA: Diagnosis not present

## 2021-03-03 DIAGNOSIS — D472 Monoclonal gammopathy: Secondary | ICD-10-CM | POA: Insufficient documentation

## 2021-03-03 DIAGNOSIS — D469 Myelodysplastic syndrome, unspecified: Secondary | ICD-10-CM

## 2021-03-03 DIAGNOSIS — Z85118 Personal history of other malignant neoplasm of bronchus and lung: Secondary | ICD-10-CM | POA: Diagnosis not present

## 2021-03-03 DIAGNOSIS — D61818 Other pancytopenia: Secondary | ICD-10-CM | POA: Insufficient documentation

## 2021-03-03 DIAGNOSIS — C50912 Malignant neoplasm of unspecified site of left female breast: Secondary | ICD-10-CM

## 2021-03-03 DIAGNOSIS — Z87891 Personal history of nicotine dependence: Secondary | ICD-10-CM | POA: Insufficient documentation

## 2021-03-03 DIAGNOSIS — Z923 Personal history of irradiation: Secondary | ICD-10-CM | POA: Diagnosis not present

## 2021-03-03 DIAGNOSIS — E871 Hypo-osmolality and hyponatremia: Secondary | ICD-10-CM | POA: Diagnosis not present

## 2021-03-03 DIAGNOSIS — Z888 Allergy status to other drugs, medicaments and biological substances status: Secondary | ICD-10-CM | POA: Insufficient documentation

## 2021-03-03 DIAGNOSIS — Z8719 Personal history of other diseases of the digestive system: Secondary | ICD-10-CM | POA: Diagnosis not present

## 2021-03-03 DIAGNOSIS — Z853 Personal history of malignant neoplasm of breast: Secondary | ICD-10-CM | POA: Insufficient documentation

## 2021-03-03 DIAGNOSIS — C9310 Chronic myelomonocytic leukemia not having achieved remission: Secondary | ICD-10-CM | POA: Diagnosis not present

## 2021-03-03 DIAGNOSIS — Z79899 Other long term (current) drug therapy: Secondary | ICD-10-CM | POA: Insufficient documentation

## 2021-03-03 DIAGNOSIS — G629 Polyneuropathy, unspecified: Secondary | ICD-10-CM | POA: Diagnosis not present

## 2021-03-03 NOTE — Progress Notes (Signed)
[name], [place] [address]   CLINIC:  Medical Oncology/Hematology  PCP:  Celene Squibb, MD [address] [name]  [phone]  REASON FOR VISIT:  Follow-up for MDS and left breast cancer  PRIOR THERAPY: Lumpectomy and XRT in 1999, followed by tamoxifen for 5 years  CURRENT THERAPY: Observation  INTERVAL HISTORY:  Lisa [name], a 87 y.o. female, returns for routine follow-up for her MDS and left breast cancer. Lisa [name] was last seen on 12/12/2020.  Today she is accompanied by her daughter and she reports feeling low energy levels. She has been feeling restless for the past 2 nights due to trazodone and watery diarrhea for the past 3 days. She denies having any new meds added. She is doing PT at home and has 2 weeks left; she has a walker and a cane and is able to walk to the bathroom with her walker. She is still on 2 L of oxygen via [place], though not the entire time. She denies having any recent infections.   REVIEW OF SYSTEMS:  Review of Systems  Constitutional: Positive for appetite change (50%) and fatigue (depleted).  Gastrointestinal: Positive for diarrhea (watery for last 3 days).  Neurological: Positive for headaches (occasional) and numbness (hands & feet).  All other systems reviewed and are negative.   PAST MEDICAL/SURGICAL HISTORY:  Past Medical History:  Diagnosis Date  . Adenocarcinoma of left lung ([place]) 2006  . Arthritis   . Asthma   . Cancer of breast, female ([place])   . Cancer of lung ([place])   . Hypertension   . Hypothyroidism   . Invasive ductal carcinoma of left breast ([place]) 1999  . Neutropenia ([employer]) 06/09/2016  . Personal history of radiation therapy    Past Surgical History:  Procedure Laterality Date  . BIOPSY  10/23/2020   Procedure: BIOPSY;  Surgeon: Yetta Flock, MD;  Location: [facility] ENDOSCOPY;  Service: Gastroenterology;;  . BREAST BIOPSY     left axillary node dissection  .  BREAST LUMPECTOMY Left   . COLONOSCOPY  03/08/2003   ENM:[id number] rectal polyps destroyed with the tip of the snare/Polyps at hepatic flexure, splenic flexure at 35 cm/Left-sided diverticula: unable to retrieve path  . COLONOSCOPY  09/12/2008   RPR:XYVOPF rectum and distal sigmoid diminutive polyps/scattered left sided diverticulum. hyperplastic  . COLONOSCOPY N/A 03/06/2013   YTW:KMQKMMN polyp-removed as described above; colonic diverticulosis. hyperplastic polyps. next TCS 03/2018  . ESOPHAGOGASTRODUODENOSCOPY (EGD) WITH PROPOFOL N/A 10/23/2020   Procedure: ESOPHAGOGASTRODUODENOSCOPY (EGD) WITH PROPOFOL;  Surgeon: Yetta Flock, MD;  Location: [facility];  Service: Gastroenterology;  Laterality: N/A;  . FOOT SURGERY    . LUNG REMOVAL, PARTIAL     upper lobe    SOCIAL HISTORY:  Social History   Socioeconomic History  . Marital status: Widowed    Spouse name: Not on file  . Number of children: Not on file  . Years of education: Not on file  . Highest education level: Not on file  Occupational History  . Not on file  Tobacco Use  . Smoking status: Former Smoker    Packs/day: 0.75    Years: 35.00    Pack years: 26.25    Quit date: 1993    Years since quitting: 29.3  . Smokeless tobacco: Never Used  . Tobacco comment: smoke-free X 30 yeras  Vaping Use  . Vaping Use: Never used  Substance and Sexual Activity  . Alcohol  use: No  . Drug use: No  . Sexual activity: Not Currently  Other Topics Concern  . Not on file  Social History Narrative  . Not on file   Social Determinants of Health   Financial Resource Strain: Not on file  Food Insecurity: Not on file  Transportation Needs: No Transportation Needs  . Lack of Transportation (Medical): No  . Lack of Transportation (Non-Medical): No  Physical Activity: Inactive  . Days of Exercise per Week: 0 days  . Minutes of Exercise per Session: 0 min  Stress: Not on file  Social Connections: Not on file  Intimate  Partner Violence: Not At Risk  . Fear of Current or Ex-Partner: No  . Emotionally Abused: No  . Physically Abused: No  . Sexually Abused: No    FAMILY HISTORY:  Family History  Problem Relation Age of Onset  . Cancer Mother   . Cancer Sister   . Colon cancer Neg Hx     CURRENT MEDICATIONS:  Current Outpatient Medications  Medication Sig Dispense Refill  . acetaminophen (TYLENOL) 325 MG tablet Take 2 tablets (650 mg total) by mouth every 6 (six) hours.    Marland Kitchen albuterol (PROVENTIL,VENTOLIN) 90 MCG/ACT inhaler Inhale 2 puffs into the lungs every 6 (six) hours as needed for wheezing or shortness of breath. 17 g 12  . furosemide (LASIX) 20 MG tablet Take by mouth.    . Iron-FA-B Cmp-C-Biot-Probiotic (FUSION PLUS) CAPS Take 1 capsule by mouth daily.    Marland Kitchen levothyroxine (SYNTHROID) 88 MCG tablet Take 88 mcg by mouth daily.    Marland Kitchen loratadine (CLARITIN) 10 MG tablet Take 10 mg by mouth at bedtime as needed.    . metoprolol succinate (TOPROL-XL) 50 MG 24 hr tablet Take 50 mg by mouth daily.    . pantoprazole (PROTONIX) 40 MG tablet Take 1 tablet (40 mg total) by mouth daily. 30 tablet 0  . polyethylene glycol (MIRALAX / GLYCOLAX) 17 g packet Take 17 g by mouth daily as needed for moderate constipation. 14 each 0  . senna-docusate (SENOKOT-S) 8.6-50 MG tablet Take 1 tablet by mouth at bedtime.    . traMADol (ULTRAM) 50 MG tablet Take 1 tablet (50 mg total) by mouth every 6 (six) hours as needed for moderate pain. 30 tablet 0  . traZODone (DESYREL) 50 MG tablet Take 1 tablet (50 mg total) by mouth at bedtime. 30 tablet 0  . umeclidinium bromide (INCRUSE ELLIPTA) 62.5 MCG/INH AEPB Inhale into the lungs daily.    . Vitamin D, Ergocalciferol, (DRISDOL) 1.25 MG (50000 UNIT) CAPS capsule Take 50,000 Units by mouth once a week.     No current facility-administered medications for this visit.    ALLERGIES:  Allergies  Allergen Reactions  . Meloxicam Other (See Comments)    Caused an injury to the  kidneys, per nephrologist  . Micardis Hct [Telmisartan-Hctz] Other (See Comments)    Caused an injury to the kidneys, per nephrologist    PHYSICAL EXAM:  Performance status (ECOG): 3 - Symptomatic, >50% confined to bed  Vitals:   03/03/21 1157  BP: 137/74  Pulse: 93  Resp: 18  Temp: (!) 96.8 F (36 C)  SpO2: 96%   Wt Readings from Last 3 Encounters:  03/03/21 163 lb 3.2 oz (74 kg)  01/13/21 180 lb 8 oz (81.9 kg)  12/12/20 157 lb 5 oz (71.4 kg)   Physical Exam Vitals reviewed.  Constitutional:      Appearance: Normal appearance.     Interventions:  Nasal cannula in place.     Comments: In wheelchair  Cardiovascular:     Rate and Rhythm: Normal rate and regular rhythm.     Pulses: Normal pulses.     Heart sounds: Normal heart sounds.  Pulmonary:     Effort: Pulmonary effort is normal.     Breath sounds: Normal breath sounds.  Neurological:     General: No focal deficit present.     Mental Status: She is alert and oriented to person, place, and time.  Psychiatric:        Mood and Affect: Mood normal.        Behavior: Behavior normal.     LABORATORY DATA:  I have reviewed the labs as listed.  CBC Latest Ref Rng & Units 02/26/2021 01/13/2021 12/12/2020  WBC 4.0 - 10.5 K/uL 2.2(L) 2.2(L) 2.8(L)  Hemoglobin 12.0 - 15.0 g/dL 9.4(L) 9.5(L) 9.5(L)  Hematocrit 36.0 - 46.0 % 33.1(L) 34.7(L) 34.2(L)  Platelets 150 - 400 K/uL 49(L) 53(L) 88(L)   CMP Latest Ref Rng & Units 02/26/2021 01/13/2021 12/12/2020  Glucose 70 - 99 mg/dL 107(H) 88 100(H)  BUN 8 - 23 mg/dL _0 Creatinine 0.44 - 1.00 mg/dL 0.94 0.90 0.92  Sodium 135 - 145 mmol/L 132(L) 130(L) 127(L)  Potassium 3.5 - 5.1 mmol/L 4.1 4.7 4.2  Chloride 98 - 111 mmol/L 100 96(L) 92(L)  CO2 22 - 32 mmol/L _1 Calcium 8.9 - 10.3 mg/dL 9.0 9.1 9.0  Total Protein 6.5 - 8.1 g/dL 7.9 8.9(H) 8.8(H)  Total Bilirubin 0.3 - 1.2 mg/dL 0.9 0.8 0.9  Alkaline Phos 38 - 126 U/L 41 42 45  AST 15 - 41 U/L _2 ALT 0 - 44 U/L  _3 Component Value Date/Time   RBC 3.30 (L) 02/26/2021 1217   MCV 100.3 (H) 02/26/2021 1217   MCH 28.5 02/26/2021 1217   MCHC 28.4 (L) 02/26/2021 1217   RDW 20.9 (H) 02/26/2021 1217   LYMPHSABS 1.7 02/26/2021 1217   MONOABS 0.3 02/26/2021 1217   EOSABS 0.0 02/26/2021 1217   BASOSABS 0.0 02/26/2021 1217    DIAGNOSTIC IMAGING:  I have independently reviewed the scans and discussed with the patient. No results found.   ASSESSMENT:  1.  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 Nevada Crane for pancytopenia. Subsequently her white count has recovered along with recovery of the hemoglobin. -Bone marrow biopsy on 11/06/2020 shows hypercellular marrow with trilineage dyspoiesis and polytypic plasmacytosis. Primary differential includes CMML and other MDS. Definitive blast population is not identified morphologically or by flow. There is a increased immature mononuclear cells favored to be monocytic in origin. Megakaryocytes are frequently hypolobated and hyperchromatic without clustering. Atypical appearing erythroid precursors. Increased plasma cells 5 to 9% which are polytypic by kappa and lambda staining. -Chromosome analysis shows 46,X, idiocentric X chromosome[9]/46, XX[11]. -MDS FISH panel was negative. -Revised IPSS score of 3.  In the absence of treatment, median survival of 5.3 years and 25% AML progression is 10.8 years. -SPEP, methylmalonic acid, ferritin levels were normal. - Serum copper level is 87.  2. Stage I left breast cancer: -Diagnosed in Sep 1999, treated with lumpectomy and XRT. ER/PR was positive and received tamoxifen for 5 years. -Mammogram on 2019-10-30 was BI-RADS Category 2. Physical exam did not reveal any palpable masses today.  3. Stage I adenocarcinoma of the left upper lobe of the lung: -Left upper lobectomy on 2005-06-01. CT  chest on 2018-06-09 was stable. -CT CAP on 10/24/2020 shows stable postsurgical changes from the  partial left upper lobe resection. No other signs of malignancy.   PLAN:  1.  Low riskMDS/CMML: -She has pancytopenia from CMML. - Reviewed labs from 02/26/2021 which showed white count 2.2 with ANC 200.  Platelet count is 49.  Hemoglobin is stable at 9.4. - Ferritin was 553 with percent saturation 13.  L02 and folic acid were normal.  Kidney function was also normal.  LDH was elevated at 294. - Overall her functional status has improved since last visit.  She has 2 more weeks of physical therapy remaining.  She is able to walk with a walker and a cane. - I have recommended follow-up in 6 weeks with repeat labs. - If there is significant improvement in her functional status, will consider azacitidine.  2. Peripheral neuropathy: - Continue gabapentin.  3.  Hyponatremia: -Had hyponatremia thought to be secondary to SIADH.  This is slowly improving.  Current sodium is 132.  Orders placed this encounter:  No orders of the defined types were placed in this encounter.    Derek Jack, MD [place] [phone]   I, Milinda Antis, am acting as a scribe for Dr. Sanda Linger.  I, Derek Jack MD, have reviewed the above documentation for accuracy and completeness, and I agree with the above.

## 2021-03-03 NOTE — Patient Instructions (Signed)
[facility] at [facility] Discharge Instructions  You were seen today by Dr. Delton Coombes. He went over your recent results. Contact Dr. Juel Burrow office for the restlessness and the additional oxygen. If you still have watery diarrhea, purchase Imodium over the counter and take 2 tablets after the first watery bowel movement, then take 1 tablet after every subsequent watery BM. Dr. Delton Coombes will see you back in 6 weeks for labs and follow up.   Thank you for choosing [facility] at [facility] to provide your oncology and hematology care.  To afford each patient quality time with our provider, please arrive at least 15 minutes before your scheduled appointment time.   If you have a lab appointment with the [facility] please come in thru the Main Entrance and check in at the main information desk  You need to re-schedule your appointment should you arrive 10 or more minutes late.  We strive to give you quality time with our providers, and arriving late affects you and other patients whose appointments are after yours.  Also, if you no show three or more times for appointments you may be dismissed from the clinic at the providers discretion.     Again, thank you for choosing [facility]  Our hope is that these requests will decrease the amount of time that you wait before being seen by our physicians.       _____________________________________________________________  Should you have questions after your visit to [facility], please contact our office at [phone] between the hours of 8:00 a.m. and 4:30 p.m.  Voicemails left after 4:00 p.m. will not be returned until the following business day.  For prescription refill requests, have your pharmacy contact our office and allow 72 hours.    Cancer Center Support Programs:   > Cancer Support Group  2nd Tuesday of the month 1pm-2pm, Journey Room

## 2021-03-04 DIAGNOSIS — D638 Anemia in other chronic diseases classified elsewhere: Secondary | ICD-10-CM | POA: Diagnosis not present

## 2021-03-04 DIAGNOSIS — D61818 Other pancytopenia: Secondary | ICD-10-CM | POA: Diagnosis not present

## 2021-03-04 DIAGNOSIS — I482 Chronic atrial fibrillation, unspecified: Secondary | ICD-10-CM | POA: Diagnosis not present

## 2021-03-04 DIAGNOSIS — M10361 Gout due to renal impairment, right knee: Secondary | ICD-10-CM | POA: Diagnosis not present

## 2021-03-04 DIAGNOSIS — N183 Chronic kidney disease, stage 3 unspecified: Secondary | ICD-10-CM | POA: Diagnosis not present

## 2021-03-04 DIAGNOSIS — I129 Hypertensive chronic kidney disease with stage 1 through stage 4 chronic kidney disease, or unspecified chronic kidney disease: Secondary | ICD-10-CM | POA: Diagnosis not present

## 2021-03-07 DIAGNOSIS — I129 Hypertensive chronic kidney disease with stage 1 through stage 4 chronic kidney disease, or unspecified chronic kidney disease: Secondary | ICD-10-CM | POA: Diagnosis not present

## 2021-03-07 DIAGNOSIS — M10361 Gout due to renal impairment, right knee: Secondary | ICD-10-CM | POA: Diagnosis not present

## 2021-03-07 DIAGNOSIS — D61818 Other pancytopenia: Secondary | ICD-10-CM | POA: Diagnosis not present

## 2021-03-07 DIAGNOSIS — N183 Chronic kidney disease, stage 3 unspecified: Secondary | ICD-10-CM | POA: Diagnosis not present

## 2021-03-07 DIAGNOSIS — I482 Chronic atrial fibrillation, unspecified: Secondary | ICD-10-CM | POA: Diagnosis not present

## 2021-03-07 DIAGNOSIS — D638 Anemia in other chronic diseases classified elsewhere: Secondary | ICD-10-CM | POA: Diagnosis not present

## 2021-03-12 ENCOUNTER — Ambulatory Visit: Payer: Medicare Other | Admitting: Gastroenterology

## 2021-03-20 ENCOUNTER — Telehealth: Payer: Self-pay | Admitting: *Deleted

## 2021-03-20 DIAGNOSIS — M10361 Gout due to renal impairment, right knee: Secondary | ICD-10-CM | POA: Diagnosis not present

## 2021-03-20 DIAGNOSIS — N183 Chronic kidney disease, stage 3 unspecified: Secondary | ICD-10-CM | POA: Diagnosis not present

## 2021-03-20 DIAGNOSIS — D638 Anemia in other chronic diseases classified elsewhere: Secondary | ICD-10-CM | POA: Diagnosis not present

## 2021-03-20 DIAGNOSIS — I129 Hypertensive chronic kidney disease with stage 1 through stage 4 chronic kidney disease, or unspecified chronic kidney disease: Secondary | ICD-10-CM | POA: Diagnosis not present

## 2021-03-20 DIAGNOSIS — I482 Chronic atrial fibrillation, unspecified: Secondary | ICD-10-CM | POA: Diagnosis not present

## 2021-03-20 DIAGNOSIS — D61818 Other pancytopenia: Secondary | ICD-10-CM | POA: Diagnosis not present

## 2021-03-20 NOTE — Telephone Encounter (Signed)
Mickel Baas called from [facility] to report that they have discharged [facility], goals met.

## 2021-03-21 DIAGNOSIS — G609 Hereditary and idiopathic neuropathy, unspecified: Secondary | ICD-10-CM | POA: Diagnosis not present

## 2021-03-21 DIAGNOSIS — L603 Nail dystrophy: Secondary | ICD-10-CM | POA: Diagnosis not present

## 2021-03-30 ENCOUNTER — Other Ambulatory Visit: Payer: Self-pay

## 2021-03-30 ENCOUNTER — Emergency Department (HOSPITAL_COMMUNITY): Payer: Medicare Other

## 2021-03-30 ENCOUNTER — Encounter (HOSPITAL_COMMUNITY): Payer: Self-pay | Admitting: Emergency Medicine

## 2021-03-30 ENCOUNTER — Emergency Department (HOSPITAL_COMMUNITY)
Admission: EM | Admit: 2021-03-30 | Discharge: 2021-03-31 | Disposition: A | Discharge status: Home / Self Care | Payer: Medicare Other | Attending: Emergency Medicine | Admitting: Emergency Medicine

## 2021-03-30 DIAGNOSIS — Z87891 Personal history of nicotine dependence: Secondary | ICD-10-CM | POA: Insufficient documentation

## 2021-03-30 DIAGNOSIS — R400 Somnolence: Secondary | ICD-10-CM | POA: Diagnosis not present

## 2021-03-30 DIAGNOSIS — Z79899 Other long term (current) drug therapy: Secondary | ICD-10-CM | POA: Insufficient documentation

## 2021-03-30 DIAGNOSIS — R059 Cough, unspecified: Secondary | ICD-10-CM | POA: Diagnosis not present

## 2021-03-30 DIAGNOSIS — Z85118 Personal history of other malignant neoplasm of bronchus and lung: Secondary | ICD-10-CM | POA: Diagnosis not present

## 2021-03-30 DIAGNOSIS — N1832 Chronic kidney disease, stage 3b: Secondary | ICD-10-CM | POA: Diagnosis not present

## 2021-03-30 DIAGNOSIS — J45909 Unspecified asthma, uncomplicated: Secondary | ICD-10-CM | POA: Insufficient documentation

## 2021-03-30 DIAGNOSIS — B9689 Other specified bacterial agents as the cause of diseases classified elsewhere: Secondary | ICD-10-CM | POA: Insufficient documentation

## 2021-03-30 DIAGNOSIS — E039 Hypothyroidism, unspecified: Secondary | ICD-10-CM | POA: Insufficient documentation

## 2021-03-30 DIAGNOSIS — J9811 Atelectasis: Secondary | ICD-10-CM | POA: Diagnosis not present

## 2021-03-30 DIAGNOSIS — N390 Urinary tract infection, site not specified: Secondary | ICD-10-CM | POA: Insufficient documentation

## 2021-03-30 DIAGNOSIS — Z7951 Long term (current) use of inhaled steroids: Secondary | ICD-10-CM | POA: Diagnosis not present

## 2021-03-30 DIAGNOSIS — E871 Hypo-osmolality and hyponatremia: Secondary | ICD-10-CM | POA: Diagnosis not present

## 2021-03-30 DIAGNOSIS — Z853 Personal history of malignant neoplasm of breast: Secondary | ICD-10-CM | POA: Diagnosis not present

## 2021-03-30 DIAGNOSIS — I517 Cardiomegaly: Secondary | ICD-10-CM | POA: Diagnosis not present

## 2021-03-30 DIAGNOSIS — R21 Rash and other nonspecific skin eruption: Secondary | ICD-10-CM | POA: Diagnosis not present

## 2021-03-30 DIAGNOSIS — I129 Hypertensive chronic kidney disease with stage 1 through stage 4 chronic kidney disease, or unspecified chronic kidney disease: Secondary | ICD-10-CM | POA: Diagnosis not present

## 2021-03-30 DIAGNOSIS — R531 Weakness: Secondary | ICD-10-CM | POA: Diagnosis present

## 2021-03-30 LAB — URINALYSIS, ROUTINE W REFLEX MICROSCOPIC
Bilirubin Urine: NEGATIVE
Glucose, UA: NEGATIVE mg/dL
Hgb urine dipstick: NEGATIVE
Ketones, ur: NEGATIVE mg/dL
Nitrite: NEGATIVE
Protein, ur: 30 mg/dL — AB
Specific Gravity, Urine: 1.014 (ref 1.005–1.030)
WBC, UA: >50 WBC/hpf — ABNORMAL HIGH (ref 0–5)
pH: 6 (ref 5.0–8.0)

## 2021-03-30 LAB — COMPREHENSIVE METABOLIC PANEL
ALT: 10 U/L (ref 0–44)
AST: 17 U/L (ref 15–41)
Albumin: 2.5 g/dL — ABNORMAL LOW (ref 3.5–5.0)
Alkaline Phosphatase: 54 U/L (ref 38–126)
Anion gap: 7 (ref 5–15)
BUN: 13 mg/dL (ref 8–23)
CO2: 28 mmol/L (ref 22–32)
Calcium: 8.2 mg/dL — ABNORMAL LOW (ref 8.9–10.3)
Chloride: 92 mmol/L — ABNORMAL LOW (ref 98–111)
Creatinine, Ser: 0.83 mg/dL (ref 0.44–1.00)
GFR, Estimated: >60 mL/min (ref >60)
Glucose, Bld: 105 mg/dL — ABNORMAL HIGH (ref 70–99)
Potassium: 4 mmol/L (ref 3.5–5.1)
Sodium: 127 mmol/L — ABNORMAL LOW (ref 135–145)
Total Bilirubin: 1.1 mg/dL (ref 0.3–1.2)
Total Protein: 9.1 g/dL — ABNORMAL HIGH (ref 6.5–8.1)

## 2021-03-30 LAB — CBC WITH DIFFERENTIAL/PLATELET
Basophils Absolute: 0 10*3/uL (ref 0.0–0.1)
Basophils Relative: 0 %
Eosinophils Absolute: 0 10*3/uL (ref 0.0–0.5)
Eosinophils Relative: 0 %
HCT: 33.3 % — ABNORMAL LOW (ref 36.0–46.0)
Hemoglobin: 9.7 g/dL — ABNORMAL LOW (ref 12.0–15.0)
Lymphocytes Relative: 23 %
Lymphs Abs: 1.3 10*3/uL (ref 0.7–4.0)
MCH: 29.3 pg (ref 26.0–34.0)
MCHC: 29.1 g/dL — ABNORMAL LOW (ref 30.0–36.0)
MCV: 100.6 fL — ABNORMAL HIGH (ref 80.0–100.0)
Monocytes Absolute: 3.6 10*3/uL — ABNORMAL HIGH (ref 0.1–1.0)
Monocytes Relative: 64 %
Neutro Abs: 1 10*3/uL — ABNORMAL LOW (ref 1.7–7.7)
Neutrophils Relative %: 12 %
Platelets: 78 10*3/uL — ABNORMAL LOW (ref 150–400)
RBC: 3.31 MIL/uL — ABNORMAL LOW (ref 3.87–5.11)
RDW: 18.1 % — ABNORMAL HIGH (ref 11.5–15.5)
WBC: 5.7 10*3/uL (ref 4.0–10.5)
nRBC: 1.4 % — ABNORMAL HIGH (ref 0.0–0.2)
nRBC: 2 /100 WBC — ABNORMAL HIGH

## 2021-03-30 MED ORDER — CEPHALEXIN 500 MG PO CAPS: 500.0000 mg | ORAL_CAPSULE | Freq: Three times a day (TID) | ORAL | 0 refills | Status: AC

## 2021-03-30 MED ORDER — CEPHALEXIN 500 MG PO CAPS
500.0000 mg | ORAL_CAPSULE | Freq: Once | ORAL | Status: AC
  Administered 2021-03-30: 500 mg via ORAL
  Filled 2021-03-30: qty 1

## 2021-03-30 NOTE — ED Triage Notes (Signed)
Pt here from home with son. Pt c/o bilateral lower leg weakness, unable to sleep at night, bowel incontinence, and memory loss.

## 2021-03-30 NOTE — Discharge Instructions (Addendum)
Your salt level is slightly low but not as low as it was last year.  I would like for you to use some extra salt on your food for the next few days  Your urinalysis showed that you have a urinary tract infection, take cephalexin 3 times daily for the next 7 days  If you should develop increasing or worsening symptoms including headache vomiting fever chills coughing shortness of breath or any worsening symptoms please return to the emergency department immediately   Please follow-up with your doctor in 1 week for a recheck of your blood work and your urinalysis.  Call first thing in the morning to make an appointment for the next billable appointment this week.  If you cannot be rechecked in the office you may return to the emergency department for a recheck

## 2021-03-30 NOTE — ED Provider Notes (Signed)
[facility] EMERGENCY DEPARTMENT Provider Note   CSN: [id number] Arrival date & time: 03/30/21  2109     History No chief complaint on file.   Lisa [name] is a 87 y.o. female.  HPI   This patient is an 87 year old female, she has a history of a myelodysplastic syndrome followed by Dr. Delton Coombes, it also sounds like she has a history of prior breast cancer and lung cancer and has had resection of her left lung, she no longer smokes cigarettes.  She does have a history of hypertension and hypothyroidism as well as asthma.  She currently lives locally, she is accompanied by her son who is the primary historian today.  It appears that there have been multiple issues that been going on for the last month or so.  It is reported that she has had some progressive confusion where she is forgetting who she is talking to, calling people by the wrong names, she has not had any falls but has had multiple episodes where she cannot make it to the bathroom in time and actually uses the bathroom on herself with a bowel movement.  She reports that sometimes she has to urinate but is unable to go, she does not have any burning, she denies any fevers nausea vomiting swelling of the legs headache or blurred vision.  She does report that she has some weakness in her legs and according to the son after being admitted to the hospital, it was noted that her sodium was 122, had an upper endoscopy by GI, there is no ulcerations, she was discharged in a stable fashion to follow-up in the outpatient setting.  She had mild pancytopenia with a white blood cell count of 2.5, hemoglobin of 8.2 and platelets of 70,000.  She is currently not under any treatment for her myelodysplastic syndrome.  The patient has been doing some physical therapy but is just a small amount around the house, she is gone out several times in the last couple of weeks and overexerted herself which makes her legs hurt.  They do not hurt at this  time.  She additionally has a feeling like she cannot sleep, the son states that she takes catnaps throughout the day and the night but is never get a really good sleep.  She denies chest pain  Past Medical History:  Diagnosis Date  . Adenocarcinoma of left lung ([place]) 2006  . Arthritis   . Asthma   . Cancer of breast, female ([place])   . Cancer of lung ([place])   . Hypertension   . Hypothyroidism   . Invasive ductal carcinoma of left breast ([place]) 1999  . Neutropenia ([place]) 06/09/2016  . Personal history of radiation therapy     Patient Active Problem List   Diagnosis Date Noted  . Hyponatremia   . Labile blood pressure   . Debility 10/30/2020  . Anemia of chronic disease   . New onset atrial fibrillation ([place])   . Malnutrition of moderate degree 10/28/2020  . Bilateral knee effusions   . FTT (failure to thrive) in adult   . Decreased appetite   . Weakness   . Acute renal failure superimposed on stage 3b chronic kidney disease ([place]) 10/21/2020  . Gout 10/21/2020  . Hypothyroidism 10/21/2020  . HTN (hypertension) 10/21/2020  . Weight loss 10/21/2020  . Generalized muscle weakness 10/21/2020  . Osteoarthritis 10/21/2020  . Atrial fibrillation with RVR ([place]) 10/21/2020  . Anemia 03/13/2019  . Acute respiratory failure with hypoxia ([place])  05/31/2017  . Hyperglycemia 05/31/2017  . Chronic kidney disease, stage 3 unspecified ([employer] [place]) 05/31/2017  . Normocytic anemia 05/31/2017  . Acute exacerbation of chronic obstructive airways disease ([place]) 05/31/2017  . Abdominal aortic aneurysm ([place]) 05/31/2017  . Urinary incontinence 05/31/2017  . Vitamin D deficiency 06/10/2016  . Neutropenia ([place]) 06/09/2016  . Invasive ductal carcinoma of left breast ([place]) 06/09/2016  . Adenocarcinoma of left lung ([place]) 06/09/2016  . Malignant neoplasm of unspecified site of unspecified female breast ([place]) 06/09/2016  . Adenomatous polyps 02/08/2013    Past Surgical History:  Procedure Laterality Date  .  BIOPSY  10/23/2020   Procedure: BIOPSY;  Surgeon: Yetta Flock, MD;  Location: [facility];  Service: Gastroenterology;;  . BREAST BIOPSY     left axillary node dissection  . BREAST LUMPECTOMY Left   . COLONOSCOPY  03/08/2003   GYI:RSWNIOEVOJ rectal polyps destroyed with the tip of the snare/Polyps at hepatic flexure, splenic flexure at 35 cm/Left-sided diverticula: unable to retrieve path  . COLONOSCOPY  09/12/2008   JKK:XFGHWE rectum and distal sigmoid diminutive polyps/scattered left sided diverticulum. hyperplastic  . COLONOSCOPY N/A 03/06/2013   XHB:ZJIRCVE polyp-removed as described above; colonic diverticulosis. hyperplastic polyps. next TCS 03/2018  . ESOPHAGOGASTRODUODENOSCOPY (EGD) WITH PROPOFOL N/A 10/23/2020   Procedure: ESOPHAGOGASTRODUODENOSCOPY (EGD) WITH PROPOFOL;  Surgeon: Yetta Flock, MD;  Location: [facility];  Service: Gastroenterology;  Laterality: N/A;  . FOOT SURGERY    . LUNG REMOVAL, PARTIAL     upper lobe     OB History   No obstetric history on file.     Family History  Problem Relation Age of Onset  . Cancer Mother   . Cancer Sister   . Colon cancer Neg Hx     Social History   Tobacco Use  . Smoking status: Former Smoker    Packs/day: 0.75    Years: 35.00    Pack years: 26.25    Quit date: 1993    Years since quitting: 29.4  . Smokeless tobacco: Never Used  . Tobacco comment: smoke-free X 30 yeras  Vaping Use  . Vaping Use: Never used  Substance Use Topics  . Alcohol use: No  . Drug use: No    Home Medications Prior to Admission medications   Medication Sig Start Date End Date Taking? Authorizing Provider  cephALEXin (KEFLEX) 500 MG capsule Take 1 capsule (500 mg total) by mouth 3 (three) times daily for 7 days. 03/30/21 04/06/21 Yes Noemi Chapel, MD  acetaminophen (TYLENOL) 325 MG tablet Take 2 tablets (650 mg total) by mouth every 6 (six) hours. 11/11/20   Angiulli, Lavon Paganini, PA-C  albuterol (PROVENTIL,VENTOLIN) 90  MCG/ACT inhaler Inhale 2 puffs into the lungs every 6 (six) hours as needed for wheezing or shortness of breath. 11/11/20   Angiulli, Lavon Paganini, PA-C  furosemide (LASIX) 20 MG tablet Take by mouth. 01/31/21   [provider]  Iron-FA-B Cmp-C-Biot-Probiotic (FUSION PLUS) CAPS Take 1 capsule by mouth daily.    [provider]  levothyroxine (SYNTHROID) 88 MCG tablet Take 88 mcg by mouth daily. 02/17/21   [provider]  loratadine (CLARITIN) 10 MG tablet Take 10 mg by mouth at bedtime as needed. 01/18/21   [provider]  metoprolol succinate (TOPROL-XL) 50 MG 24 hr tablet Take 50 mg by mouth daily. 12/20/20   [provider]  pantoprazole (PROTONIX) 40 MG tablet Take 1 tablet (40 mg total) by mouth daily. 11/11/20   Angiulli, Lavon Paganini, PA-C  polyethylene  glycol (MIRALAX / GLYCOLAX) 17 g packet Take 17 g by mouth daily as needed for moderate constipation. 10/30/20   Kayleen Memos, DO  senna-docusate (SENOKOT-S) 8.6-50 MG tablet Take 1 tablet by mouth at bedtime. 11/11/20   Angiulli, Lavon Paganini, PA-C  traMADol (ULTRAM) 50 MG tablet Take 1 tablet (50 mg total) by mouth every 6 (six) hours as needed for moderate pain. 11/11/20   Angiulli, Lavon Paganini, PA-C  traZODone (DESYREL) 50 MG tablet Take 1 tablet (50 mg total) by mouth at bedtime. 11/11/20   Angiulli, Lavon Paganini, PA-C  umeclidinium bromide (INCRUSE ELLIPTA) 62.5 MCG/INH AEPB Inhale into the lungs daily. 12/31/20   [provider]  Vitamin D, Ergocalciferol, (DRISDOL) 1.25 MG (50000 UNIT) CAPS capsule Take 50,000 Units by mouth once a week. 01/10/21   [provider]    Allergies    Meloxicam and Micardis hct [telmisartan-hctz]  Review of Systems   Review of Systems  All other systems reviewed and are negative.   Physical Exam Updated Vital Signs BP 120/60   Pulse 83   Temp 99.8 F (37.7 C) (Oral)   Resp 18   Ht 1.702 m (5\' 7" )   Wt 74 kg   SpO2 100%   BMI 25.55 kg/m   Physical  Exam Vitals and nursing note reviewed.  Constitutional:      General: She is not in acute distress.    Appearance: She is well-developed.  HENT:     Head: Normocephalic and atraumatic.     Mouth/Throat:     Pharynx: No oropharyngeal exudate.  Eyes:     General: No scleral icterus.       Right eye: No discharge.        Left eye: No discharge.     Conjunctiva/sclera: Conjunctivae normal.     Pupils: Pupils are equal, round, and reactive to light.  Neck:     Thyroid: No thyromegaly.     Vascular: No JVD.  Cardiovascular:     Rate and Rhythm: Normal rate and regular rhythm.     Heart sounds: Normal heart sounds. No murmur heard. No friction rub. No gallop.   Pulmonary:     Effort: Pulmonary effort is normal. No respiratory distress.     Breath sounds: Normal breath sounds. No wheezing or rales.  Abdominal:     General: Bowel sounds are normal. There is no distension.     Palpations: Abdomen is soft. There is no mass.     Tenderness: There is no abdominal tenderness.  Musculoskeletal:        General: No tenderness. Normal range of motion.     Cervical back: Normal range of motion and neck supple.     Right lower leg: No edema.     Left lower leg: No edema.     Comments: All 4 extremities are very supple, the joints are very supple and the compartments are very soft, there is no reproducible tenderness.  There is no deformities.  Lymphadenopathy:     Cervical: No cervical adenopathy.  Skin:    General: Skin is warm and dry.     Findings: No erythema or rash.  Neurological:     Mental Status: She is alert.     Coordination: Coordination normal.     Comments: The patient is awake alert and able to answer my questions and follow commands, she does have periods of time where she has memory lapses and relies on her son to answer questions, there  is no facial droop, no focal weakness  Psychiatric:        Behavior: Behavior normal.     ED Results / Procedures / Treatments    Labs (all labs ordered are listed, but only abnormal results are displayed) Labs Reviewed  CBC WITH DIFFERENTIAL/PLATELET - Abnormal; Notable for the following components:      Result Value   RBC 3.31 (*)    Hemoglobin 9.7 (*)    HCT 33.3 (*)    MCV 100.6 (*)    MCHC 29.1 (*)    RDW 18.1 (*)    Platelets 78 (*)    nRBC 1.4 (*)    Neutro Abs 1.0 (*)    Monocytes Absolute 3.6 (*)    nRBC 2 (*)    All other components within normal limits  COMPREHENSIVE METABOLIC PANEL - Abnormal; Notable for the following components:   Sodium 127 (*)    Chloride 92 (*)    Glucose, Bld 105 (*)    Calcium 8.2 (*)    Total Protein 9.1 (*)    Albumin 2.5 (*)    All other components within normal limits  URINALYSIS, ROUTINE W REFLEX MICROSCOPIC - Abnormal; Notable for the following components:   APPearance HAZY (*)    Protein, ur 30 (*)    Leukocytes,Ua LARGE (*)    WBC, UA >50 (*)    Bacteria, UA FEW (*)    Non Squamous Epithelial 0-5 (*)    All other components within normal limits  URINE CULTURE    EKG None  Radiology CT Head Wo Contrast  Result Date: 03/30/2021 CLINICAL DATA:  Altered level of consciousness, lower extremity weakness, memory loss EXAM: CT HEAD WITHOUT CONTRAST TECHNIQUE: Contiguous axial images were obtained from the base of the skull through the vertex without intravenous contrast. COMPARISON:  10/21/2020 FINDINGS: Brain: Stable chronic small-vessel ischemic changes throughout the periventricular white matter. No acute infarct or hemorrhage. Lateral ventricles and midline structures are unremarkable. No acute extra-axial fluid collections. No mass effect. Vascular: No hyperdense vessel or unexpected calcification. Skull: Normal. Negative for fracture or focal lesion. Sinuses/Orbits: Minimal mucosal thickening within the left posterior ethmoid air cells and sphenoid sinus. Remaining sinuses are clear. Other: None. IMPRESSION: 1. No acute intracranial process. Stable chronic  small vessel ischemic changes. Electronically Signed   By: Randa Ngo M.D.   On: 03/30/2021 22:20   DG Chest Port 1 View  Result Date: 03/30/2021 CLINICAL DATA:  87 year old female with cough. History of lung cancer. EXAM: PORTABLE CHEST 1 VIEW COMPARISON:  Chest radiograph dated 10/26/2020. FINDINGS: Bibasilar atelectasis. No focal consolidation, pleural effusion, pneumothorax. Surgical sutures noted over the left upper lung field. Mild cardiomegaly. Left perihilar and left axillary surgical clips. No acute osseous pathology. Osteopenia with degenerative changes of the spine. IMPRESSION: 1. No acute cardiopulmonary process. 2. Mild cardiomegaly. Electronically Signed   By: Anner Crete M.D.   On: 03/30/2021 22:25    Procedures Procedures   Medications Ordered in ED Medications  cephALEXin (KEFLEX) capsule 500 mg (500 mg Oral Given 03/30/21 2349)    ED Course  I have reviewed the triage vital signs and the nursing notes.  Pertinent labs & imaging results that were available during my care of the patient were reviewed by me and considered in my medical decision making (see chart for details).    MDM Rules/Calculators/A&P  This patient's vital signs are unremarkable, she did have a oxygen level that was low off of her oxygen but on her oxygen she is 93% at her baseline 2 L by nasal cannula.  There is no focal abnormal lung sounds but does have some rattling that clears with deep breathing and coughing.  She has had lab abnormalities in the past including pancytopenia and hyponatremia, will also check for urine sample as she has had some urinary symptoms.  The patient will need a CT scan of the brain to make sure there is no signs of tumors or masses although I suspect that this is related to some progressive cognitive decline.  The patient is agreeable to the work-up as is this time, she otherwise appears very stable  Sodium is slightly low at 127 but not as  low as it was last year when it was 122.  She is not weak or seizing to any significant degree.  She has a stable mental status, her blood counts are better than they have been in the past with regards to platelets hemoglobin and white blood cells.  Urinalysis reveals infection, antibiotics given, cephalexin prescribed for home.  She is not tachycardic febrile or hypotensive here.  She is stable at this time  Final Clinical Impression(s) / ED Diagnoses Final diagnoses:  Hyponatremia  Urinary tract infection without hematuria, site unspecified    Rx / DC Orders ED Discharge Orders         Ordered    cephALEXin (KEFLEX) 500 MG capsule  3 times daily        03/30/21 2350           Noemi Chapel, MD 03/30/21 2352

## 2021-04-01 DIAGNOSIS — E871 Hypo-osmolality and hyponatremia: Secondary | ICD-10-CM | POA: Diagnosis not present

## 2021-04-01 DIAGNOSIS — J9610 Chronic respiratory failure, unspecified whether with hypoxia or hypercapnia: Secondary | ICD-10-CM | POA: Diagnosis not present

## 2021-04-01 DIAGNOSIS — M6281 Muscle weakness (generalized): Secondary | ICD-10-CM | POA: Diagnosis not present

## 2021-04-01 DIAGNOSIS — N390 Urinary tract infection, site not specified: Secondary | ICD-10-CM | POA: Diagnosis not present

## 2021-04-01 DIAGNOSIS — G4700 Insomnia, unspecified: Secondary | ICD-10-CM | POA: Diagnosis not present

## 2021-04-01 DIAGNOSIS — I4891 Unspecified atrial fibrillation: Secondary | ICD-10-CM | POA: Diagnosis not present

## 2021-04-01 DIAGNOSIS — E039 Hypothyroidism, unspecified: Secondary | ICD-10-CM | POA: Diagnosis not present

## 2021-04-02 LAB — URINE CULTURE: Culture: >=100000 — AB

## 2021-04-03 ENCOUNTER — Telehealth: Payer: Self-pay | Admitting: Emergency Medicine

## 2021-04-03 NOTE — Telephone Encounter (Signed)
Post ED Visit - Positive Culture Follow-up: Successful Patient Follow-Up  Culture assessed and recommendations reviewed by:  []  Elenor Quinones, Pharm.D. []  Heide Guile, Pharm.D., BCPS AQ-ID []  Parks Neptune, Pharm.D., BCPS []  Alycia Rossetti, Pharm.D., BCPS []  [place], Pharm.D., BCPS, AAHIVP []  Legrand Como, Pharm.D., BCPS, AAHIVP []  Salome Arnt, PharmD, BCPS []  Johnnette Gourd, PharmD, BCPS []  Hughes Better, PharmD, BCPS []  Leeroy Cha, PharmD  Positive urine culture  []  Patient discharged without antimicrobial prescription and treatment is now indicated [x]  Organism is resistant to prescribed ED discharge antimicrobial []  Patient with positive blood cultures  Changes discussed with ED provider: Malvin Johns MD New antibiotic prescription stop cephalexin, start amoxicillin 500mg  po every 8 hours x 5 days  Attempting to contact patient    Hazle Nordmann 04/03/2021, 10:16 AM

## 2021-04-03 NOTE — Progress Notes (Signed)
ED Antimicrobial Stewardship Positive Culture Follow Up   Lisa [name] is an 87 y.o. female who presented to [facility] on 03/30/2021 with a chief complaint of No chief complaint on file.  Recent Results (from the past 720 hour(s))  Urine Culture     Status: Abnormal   Collection Time: 03/30/21 10:45 PM   Specimen: Urine, Catheterized  Result Value Ref Range Status   Specimen Description   Final    URINE, CATHETERIZED Performed at [facility], [address], [place], [place] [address]    Special Requests   Final    NONE Performed at [facility], [address], [place], [place] [address]    Culture >=100,000 COLONIES/mL ENTEROCOCCUS FAECALIS (A)  Final   Report Status 04/02/2021 FINAL  Final   Organism ID, Bacteria ENTEROCOCCUS FAECALIS (A)  Final      Susceptibility   Enterococcus faecalis - MIC*    AMPICILLIN <=2 SENSITIVE Sensitive     NITROFURANTOIN <=16 SENSITIVE Sensitive     VANCOMYCIN 1 SENSITIVE Sensitive     * >=100,000 COLONIES/mL ENTEROCOCCUS FAECALIS    [x]  Treated with cephalexin, organism resistant to prescribed antimicrobial []  Patient discharged originally without antimicrobial agent and treatment is now indicated  New antibiotic prescription: Discontinue cephalexin and prescribe amoxicillin 500mg  every 8 hours x 5 days  ED Provider: Lauretta Chester, MD  Joetta Manners, PharmD, [facility] Clinical Pharmacist  Please check AMION for all [facility] phone numbers After 10:00 PM, call [facility] [phone] Monday - Friday phone -  [phone] Saturday - Sunday phone - [phone]

## 2021-04-10 ENCOUNTER — Other Ambulatory Visit (HOSPITAL_COMMUNITY): Payer: Medicare Other

## 2021-04-10 ENCOUNTER — Ambulatory Visit (HOSPITAL_COMMUNITY): Payer: Medicare Other | Admitting: Hematology

## 2021-04-15 ENCOUNTER — Other Ambulatory Visit (HOSPITAL_COMMUNITY): Payer: Medicare Other

## 2021-04-16 ENCOUNTER — Other Ambulatory Visit: Payer: Self-pay

## 2021-04-16 ENCOUNTER — Inpatient Hospital Stay (HOSPITAL_COMMUNITY): Payer: Medicare Other | Attending: Hematology

## 2021-04-16 DIAGNOSIS — D469 Myelodysplastic syndrome, unspecified: Secondary | ICD-10-CM | POA: Diagnosis not present

## 2021-04-16 DIAGNOSIS — C50912 Malignant neoplasm of unspecified site of left female breast: Secondary | ICD-10-CM | POA: Diagnosis not present

## 2021-04-16 LAB — CBC WITH DIFFERENTIAL/PLATELET
Abs Immature Granulocytes: 0.07 10*3/uL (ref 0.00–0.07)
Basophils Absolute: 0 10*3/uL (ref 0.0–0.1)
Basophils Relative: 0 %
Eosinophils Absolute: 0 10*3/uL (ref 0.0–0.5)
Eosinophils Relative: 0 %
HCT: 30.5 % — ABNORMAL LOW (ref 36.0–46.0)
Hemoglobin: 8.9 g/dL — ABNORMAL LOW (ref 12.0–15.0)
Immature Granulocytes: 2 %
Lymphocytes Relative: 38 %
Lymphs Abs: 1.2 10*3/uL (ref 0.7–4.0)
MCH: 29.8 pg (ref 26.0–34.0)
MCHC: 29.2 g/dL — ABNORMAL LOW (ref 30.0–36.0)
MCV: 102 fL — ABNORMAL HIGH (ref 80.0–100.0)
Monocytes Absolute: 1.5 10*3/uL — ABNORMAL HIGH (ref 0.1–1.0)
Monocytes Relative: 47 %
Neutro Abs: 0.4 10*3/uL — CL (ref 1.7–7.7)
Neutrophils Relative %: 13 %
Platelets: 60 10*3/uL — ABNORMAL LOW (ref 150–400)
RBC: 2.99 MIL/uL — ABNORMAL LOW (ref 3.87–5.11)
RDW: 18.9 % — ABNORMAL HIGH (ref 11.5–15.5)
WBC: 3.2 10*3/uL — ABNORMAL LOW (ref 4.0–10.5)
nRBC: 5.3 % — ABNORMAL HIGH (ref 0.0–0.2)

## 2021-04-16 LAB — COMPREHENSIVE METABOLIC PANEL
ALT: 12 U/L (ref 0–44)
AST: 22 U/L (ref 15–41)
Albumin: 2.1 g/dL — ABNORMAL LOW (ref 3.5–5.0)
Alkaline Phosphatase: 51 U/L (ref 38–126)
Anion gap: 6 (ref 5–15)
BUN: 7 mg/dL — ABNORMAL LOW (ref 8–23)
CO2: 29 mmol/L (ref 22–32)
Calcium: 8.6 mg/dL — ABNORMAL LOW (ref 8.9–10.3)
Chloride: 95 mmol/L — ABNORMAL LOW (ref 98–111)
Creatinine, Ser: 0.71 mg/dL (ref 0.44–1.00)
GFR, Estimated: >60 mL/min (ref >60)
Glucose, Bld: 102 mg/dL — ABNORMAL HIGH (ref 70–99)
Potassium: 4 mmol/L (ref 3.5–5.1)
Sodium: 130 mmol/L — ABNORMAL LOW (ref 135–145)
Total Bilirubin: 0.8 mg/dL (ref 0.3–1.2)
Total Protein: 8.1 g/dL (ref 6.5–8.1)

## 2021-04-16 LAB — LACTATE DEHYDROGENASE: LDH: 225 U/L — ABNORMAL HIGH (ref 98–192)

## 2021-04-17 ENCOUNTER — Ambulatory Visit (HOSPITAL_COMMUNITY): Payer: Medicare Other | Admitting: Hematology

## 2021-04-17 NOTE — Progress Notes (Signed)
CRITICAL VALUE STICKER  CRITICAL VALUE:  ANC 0.4  RECEIVER (on-site recipient of call):  A. Ouida Sills, [place] NOTIFIED: 04/16/2021 at 1621  MD NOTIFIED: Delton Coombes  RESPONSE:  f/u as scheduled

## 2021-04-22 ENCOUNTER — Ambulatory Visit (HOSPITAL_COMMUNITY): Payer: Medicare Other | Admitting: Hematology

## 2021-04-23 ENCOUNTER — Ambulatory Visit (HOSPITAL_COMMUNITY): Payer: Medicare Other | Admitting: Hematology

## 2021-04-24 ENCOUNTER — Inpatient Hospital Stay (HOSPITAL_COMMUNITY): Payer: Medicare Other | Admitting: Hematology

## 2021-04-29 DIAGNOSIS — I4891 Unspecified atrial fibrillation: Secondary | ICD-10-CM | POA: Diagnosis not present

## 2021-04-29 DIAGNOSIS — G4700 Insomnia, unspecified: Secondary | ICD-10-CM | POA: Diagnosis not present

## 2021-04-29 DIAGNOSIS — I10 Essential (primary) hypertension: Secondary | ICD-10-CM | POA: Diagnosis not present

## 2021-04-29 DIAGNOSIS — E871 Hypo-osmolality and hyponatremia: Secondary | ICD-10-CM | POA: Diagnosis not present

## 2021-04-29 DIAGNOSIS — E039 Hypothyroidism, unspecified: Secondary | ICD-10-CM | POA: Diagnosis not present

## 2021-04-29 DIAGNOSIS — M6281 Muscle weakness (generalized): Secondary | ICD-10-CM | POA: Diagnosis not present

## 2021-04-29 DIAGNOSIS — J9610 Chronic respiratory failure, unspecified whether with hypoxia or hypercapnia: Secondary | ICD-10-CM | POA: Diagnosis not present

## 2021-05-01 ENCOUNTER — Ambulatory Visit (HOSPITAL_COMMUNITY): Payer: Medicare Other | Admitting: Hematology and Oncology

## 2021-05-02 ENCOUNTER — Ambulatory Visit (HOSPITAL_COMMUNITY): Payer: Medicare Other | Admitting: Hematology and Oncology

## 2021-05-08 NOTE — Progress Notes (Addendum)
Cardiology Office Note    Date:  05/09/2021   ID:  Lisa [name], DOB 11/01/1934, MRN [id number]  PCP:  Celene Squibb, MD  Cardiologist: Evaluated by Dr. Margaretann Loveless during admission but lives in [facility] and prefers to follow-up in [place], [place]  Chief Complaint  Patient presents with   Follow-up    Overdue Visit    History of Present Illness:    Lisa [name] is a 87 y.o. female with past medical history of HTN, COPD, hypothyroidism, gout, breast cancer (s/p lumpectomy and radiation), history of lung cancer (s/p LUL lobectomy) and CMML/MDS who presents to the office today for overdue follow-up.  She was evaluated by Cardiology during an admission in 10/2020 during which she had presented to the ED for worsening weakness and confusion. Family members reported that she had experienced a 20 pound unintentional weight loss over the past few months along with decreased PO intake. She was found to be in new onset atrial fibrillation with RVR and Cardiology was consulted for assistance with management. She did spontaneously convert to normal sinus rhythm during admission and was continued on beta-blocker therapy. She was not started on anticoagulation given her continued weight loss, anemia, and possible need for ongoing procedures. Was also treated for an AKI, hyponatremia and H. Pylori. Was discharged to [facility] for additional therapy.  In talking with the patient and her daughter today, she has returned home but lives with family. She denies any specific chest pain or palpitations. No recent orthopnea or PND. Uses supplemental oxygen at night. She does experience intermittent lower extremity edema and reports coughing at nighttime. She also reports intermittent nausea which occurs after food consumption. Denies any specific dysphagia.   Past Medical History:  Diagnosis Date   Adenocarcinoma of left lung ([place]) 2006   Arthritis    Asthma    Cancer of breast, female ([place])    Cancer of  lung ([place])    Hypertension    Hypothyroidism    Invasive ductal carcinoma of left breast ([place]) 1999   Neutropenia ([place]) 06/09/2016   Personal history of radiation therapy     Past Surgical History:  Procedure Laterality Date   BIOPSY  10/23/2020   Procedure: BIOPSY;  Surgeon: Yetta Flock, MD;  Location: [facility] ENDOSCOPY;  Service: Gastroenterology;;   BREAST BIOPSY     left axillary node dissection   BREAST LUMPECTOMY Left    COLONOSCOPY  03/08/2003   ELF:YBOFBPZWCH rectal polyps destroyed with the tip of the snare/Polyps at hepatic flexure, splenic flexure at 36 cm/Left-sided diverticula: unable to retrieve path   COLONOSCOPY  09/12/2008   ENI:DPOEUM rectum and distal sigmoid diminutive polyps/scattered left sided diverticulum. hyperplastic   COLONOSCOPY N/A 03/06/2013   PNT:IRWERXV polyp-removed as described above; colonic diverticulosis. hyperplastic polyps. next TCS 03/2018   ESOPHAGOGASTRODUODENOSCOPY (EGD) WITH PROPOFOL N/A 10/23/2020   Procedure: ESOPHAGOGASTRODUODENOSCOPY (EGD) WITH PROPOFOL;  Surgeon: Yetta Flock, MD;  Location: [facility];  Service: Gastroenterology;  Laterality: N/A;   FOOT SURGERY     LUNG REMOVAL, PARTIAL     upper lobe    Current Medications: Outpatient Medications Prior to Visit  Medication Sig Dispense Refill   acetaminophen (TYLENOL) 325 MG tablet Take 2 tablets (650 mg total) by mouth every 6 (six) hours.     albuterol (PROVENTIL,VENTOLIN) 90 MCG/ACT inhaler Inhale 2 puffs into the lungs every 6 (six) hours as needed for wheezing or shortness of breath. 17 g 12   Iron-FA-B Cmp-C-Biot-Probiotic (FUSION PLUS) CAPS Take  1 capsule by mouth daily.     levothyroxine (SYNTHROID) 88 MCG tablet Take 88 mcg by mouth daily.     metoprolol succinate (TOPROL-XL) 50 MG 24 hr tablet Take 50 mg by mouth daily.     pantoprazole (PROTONIX) 40 MG tablet Take 1 tablet (40 mg total) by mouth daily. 30 tablet 0   polyethylene glycol (MIRALAX / GLYCOLAX) 17  g packet Take 17 g by mouth daily as needed for moderate constipation. 14 each 0   senna-docusate (SENOKOT-S) 8.6-50 MG tablet Take 1 tablet by mouth at bedtime.     traMADol (ULTRAM) 50 MG tablet Take 1 tablet (50 mg total) by mouth every 6 (six) hours as needed for moderate pain. 30 tablet 0   traZODone (DESYREL) 50 MG tablet Take 1 tablet (50 mg total) by mouth at bedtime. 30 tablet 0   umeclidinium bromide (INCRUSE ELLIPTA) 62.5 MCG/INH AEPB Inhale into the lungs daily.     Vitamin D, Ergocalciferol, (DRISDOL) 1.25 MG (50000 UNIT) CAPS capsule Take 50,000 Units by mouth once a week.     cephALEXin (KEFLEX) 500 MG capsule      furosemide (LASIX) 20 MG tablet Take 10 mg by mouth daily.     loratadine (CLARITIN) 10 MG tablet Take 10 mg by mouth at bedtime as needed. (Patient not taking: Reported on 05/09/2021)     No facility-administered medications prior to visit.     Allergies:   Meloxicam and Micardis hct [telmisartan-hctz]   Social History   Socioeconomic History   Marital status: Widowed    Spouse name: Not on file   Number of children: Not on file   Years of education: Not on file   Highest education level: Not on file  Occupational History   Not on file  Tobacco Use   Smoking status: Former    Packs/day: 0.75    Years: 35.00    Pack years: 26.25    Types: Cigarettes    Quit date: 50    Years since quitting: 29.5   Smokeless tobacco: Never   Tobacco comments:    smoke-free X 30 yeras  Vaping Use   Vaping Use: Never used  Substance and Sexual Activity   Alcohol use: No   Drug use: No   Sexual activity: Not Currently  Other Topics Concern   Not on file  Social History Narrative   Not on file   Social Determinants of Health   Financial Resource Strain: Not on file  Food Insecurity: Not on file  Transportation Needs: No Transportation Needs   Lack of Transportation (Medical): No   Lack of Transportation (Non-Medical): No  Physical Activity: Inactive   Days  of Exercise per Week: 0 days   Minutes of Exercise per Session: 0 min  Stress: Not on file  Social Connections: Not on file     Family History:  The patient's family history includes Cancer in her mother and sister.   Review of Systems:    Please see the history of present illness.     All other systems reviewed and are otherwise negative except as noted above.   Physical Exam:    VS:  BP 110/60   Pulse 80   Wt 142 lb (64.4 kg)   SpO2 93%   BMI 22.24 kg/m    General: Well developed, well nourished,female appearing in no acute distress. Sitting in wheelchair. Head: Normocephalic, atraumatic. Neck: No carotid bruits. JVD not elevated.  Lungs: Respirations regular and unlabored, without  wheezes or rales.  Heart: Regular rate and rhythm with occasional ectopic beats. No S3 or S4.  No murmur, no rubs, or gallops appreciated. Abdomen: Appears non-distended. No obvious abdominal masses. Msk:  Strength and tone appear normal for age. No obvious joint deformities or effusions. Extremities: No clubbing or cyanosis. Trace lower extremity edema bilaterally.  Distal pedal pulses are 2+ bilaterally. Neuro: Alert and oriented X 3. Moves all extremities spontaneously. No focal deficits noted. Psych:  Responds to questions appropriately with a normal affect. Skin: No rashes or lesions noted  Wt Readings from Last 3 Encounters:  05/09/21 142 lb (64.4 kg)  03/30/21 163 lb 2.3 oz (74 kg)  03/03/21 163 lb 3.2 oz (74 kg)    Studies/Labs Reviewed:   EKG:  EKG is ordered today. The ekg ordered today demonstrates sinus rhythm with PAC's, HR 95. No acute ST abnormalities.   Recent Labs: 10/22/2020: TSH 14.355 12/06/2020: Magnesium 1.8 04/16/2021: ALT 12; BUN 7; Creatinine, Ser 0.71; Hemoglobin 8.9; Platelets 60; Potassium 4.0; Sodium 130   Lipid Panel No results found for: CHOL, TRIG, HDL, CHOLHDL, VLDL, LDLCALC, LDLDIRECT  Additional studies/ records that were reviewed today include:    Echocardiogram: 10/2020 IMPRESSIONS     1. Left ventricular ejection fraction, by estimation, is 65 to 70%. The  left ventricle has normal function. The left ventricle has no regional  wall motion abnormalities. There is mild left ventricular hypertrophy.  Left ventricular diastolic parameters  are consistent with Grade I diastolic dysfunction (impaired relaxation).   2. Prominent moderator band. Right ventricular systolic function is  hyperdynamic. The right ventricular size is normal. There is moderately  elevated pulmonary artery systolic pressure.   3. Left atrial size was moderately dilated.   4. The mitral valve is grossly normal. Trivial mitral valve  regurgitation.   5. The aortic valve is tricuspid. Aortic valve regurgitation is not  visualized. Mild aortic valve sclerosis is present, with no evidence of  aortic valve stenosis.   6. The inferior vena cava is normal in size with greater than 50%  respiratory variability, suggesting right atrial pressure of 3 mmHg.   Assessment:    1. History of atrial fibrillation   2. Bilateral lower extremity edema   3. Medication management   4. MDS (myelodysplastic syndrome) ([place])      Plan:   In order of problems listed above:  1. History of Atrial Fibrillation - Occurred during her admission in 10/2020 and she was also treated for an AKI, hyponatremia and H. pylori during that admission. She denies any recent palpitations and her heart rate has been well controlled when checked at home. EKG today shows sinus arrhythmia. - Will continue Toprol-XL 50 mg daily for rate control. She has not been on anticoagulation due to her persistent anemia and thrombocytopenia and this was reviewed again with the patient and her family today. We will not initiate at this time. I encouraged them to reach out if she has any palpitations or episodes of elevated heart rate, as a monitor could be placed to assess for recurrence.  2.  Lower extremity  edema - She does have edema on examination today and reports an intermittent cough. She also has known COPD and uses supplemental oxygen at night. - She is currently on low-dose Lasix 10 mg daily. I recommended that we attempt to titrate this to 20 mg daily and will recheck a BMET next week. Also recommended limiting sodium in her diet.  3. MDS/Anemia - Followed  by Hematology/Oncology. Hgb was at 8.9 in 04/2021 with platelets at 60 K.   Medication Adjustments/Labs and Tests Ordered: Current medicines are reviewed at length with the patient today.  Concerns regarding medicines are outlined above.  Medication changes, Labs and Tests ordered today are listed in the Patient Instructions below. Patient Instructions  Medication Instructions:   Increase Lasix to 20mg  daily (1 whole tablet)  Labwork:  Will recheck your kidney function and electrolytes (BMET) next week.   Testing/Procedures:  None  Follow-Up:  With Bernerd Pho, PA-C or MD in 6 months.   Any Other Special Instructions Will Be Listed Below (If Applicable).  Please contact us if your heart rate sustains higher than 120 beats per minute.   If you need a refill on your cardiac medications before your next appointment, please call your pharmacy.   Signed, Erma Heritage, PA-C  05/09/2021 5:40 PM    [facility] [address] [place], [place] [address] Phone: [phone] Fax: [phone]

## 2021-05-09 ENCOUNTER — Other Ambulatory Visit: Payer: Self-pay

## 2021-05-09 ENCOUNTER — Encounter: Payer: Self-pay | Admitting: Student

## 2021-05-09 ENCOUNTER — Ambulatory Visit (INDEPENDENT_AMBULATORY_CARE_PROVIDER_SITE_OTHER): Payer: Medicare Other | Admitting: Student

## 2021-05-09 VITALS — BP 110/60 | HR 80 | Wt 142.0 lb

## 2021-05-09 DIAGNOSIS — Z79899 Other long term (current) drug therapy: Secondary | ICD-10-CM

## 2021-05-09 DIAGNOSIS — R600 Localized edema: Secondary | ICD-10-CM

## 2021-05-09 DIAGNOSIS — D469 Myelodysplastic syndrome, unspecified: Secondary | ICD-10-CM | POA: Diagnosis not present

## 2021-05-09 DIAGNOSIS — Z8679 Personal history of other diseases of the circulatory system: Secondary | ICD-10-CM

## 2021-05-09 MED ORDER — FUROSEMIDE 20 MG PO TABS: 20.0000 mg | ORAL_TABLET | Freq: Every day | ORAL | 3 refills | Status: DC

## 2021-05-09 NOTE — Patient Instructions (Addendum)
Medication Instructions:   Increase Lasix to 20mg  daily (1 whole tablet)  Labwork:  Will recheck your kidney function and electrolytes (BMET) next week.   Testing/Procedures:  None  Follow-Up:  With Bernerd Pho, PA-C or MD in 6 months.   Any Other Special Instructions Will Be Listed Below (If Applicable).  Please contact us if your heart rate sustains higher than 120 beats per minute.   If you need a refill on your cardiac medications before your next appointment, please call your pharmacy.

## 2021-05-14 ENCOUNTER — Encounter (HOSPITAL_COMMUNITY): Payer: Self-pay | Admitting: Hematology and Oncology

## 2021-05-14 ENCOUNTER — Inpatient Hospital Stay (HOSPITAL_COMMUNITY): Payer: Medicare Other | Attending: Hematology | Admitting: Hematology and Oncology

## 2021-05-14 ENCOUNTER — Other Ambulatory Visit (HOSPITAL_COMMUNITY): Admission: RE | Admit: 2021-05-14 | Payer: Medicare Other | Source: Ambulatory Visit

## 2021-05-14 ENCOUNTER — Other Ambulatory Visit: Payer: Self-pay

## 2021-05-14 ENCOUNTER — Other Ambulatory Visit (HOSPITAL_COMMUNITY): Payer: Medicare Other

## 2021-05-14 VITALS — BP 127/65 | HR 70 | Temp 96.8°F | Resp 18 | Wt 139.4 lb

## 2021-05-14 DIAGNOSIS — E871 Hypo-osmolality and hyponatremia: Secondary | ICD-10-CM | POA: Diagnosis not present

## 2021-05-14 DIAGNOSIS — Z809 Family history of malignant neoplasm, unspecified: Secondary | ICD-10-CM | POA: Diagnosis not present

## 2021-05-14 DIAGNOSIS — D539 Nutritional anemia, unspecified: Secondary | ICD-10-CM | POA: Diagnosis not present

## 2021-05-14 DIAGNOSIS — Z85118 Personal history of other malignant neoplasm of bronchus and lung: Secondary | ICD-10-CM | POA: Insufficient documentation

## 2021-05-14 DIAGNOSIS — Z888 Allergy status to other drugs, medicaments and biological substances status: Secondary | ICD-10-CM | POA: Diagnosis not present

## 2021-05-14 DIAGNOSIS — Z79899 Other long term (current) drug therapy: Secondary | ICD-10-CM | POA: Insufficient documentation

## 2021-05-14 DIAGNOSIS — E440 Moderate protein-calorie malnutrition: Secondary | ICD-10-CM | POA: Insufficient documentation

## 2021-05-14 DIAGNOSIS — M6281 Muscle weakness (generalized): Secondary | ICD-10-CM | POA: Insufficient documentation

## 2021-05-14 DIAGNOSIS — Z7952 Long term (current) use of systemic steroids: Secondary | ICD-10-CM | POA: Diagnosis not present

## 2021-05-14 DIAGNOSIS — Z923 Personal history of irradiation: Secondary | ICD-10-CM | POA: Diagnosis not present

## 2021-05-14 DIAGNOSIS — Z87891 Personal history of nicotine dependence: Secondary | ICD-10-CM | POA: Insufficient documentation

## 2021-05-14 DIAGNOSIS — Z853 Personal history of malignant neoplasm of breast: Secondary | ICD-10-CM | POA: Insufficient documentation

## 2021-05-14 DIAGNOSIS — D469 Myelodysplastic syndrome, unspecified: Secondary | ICD-10-CM | POA: Insufficient documentation

## 2021-05-14 DIAGNOSIS — D61818 Other pancytopenia: Secondary | ICD-10-CM | POA: Insufficient documentation

## 2021-05-14 DIAGNOSIS — C3492 Malignant neoplasm of unspecified part of left bronchus or lung: Secondary | ICD-10-CM | POA: Diagnosis not present

## 2021-05-14 DIAGNOSIS — R531 Weakness: Secondary | ICD-10-CM | POA: Insufficient documentation

## 2021-05-14 DIAGNOSIS — R627 Adult failure to thrive: Secondary | ICD-10-CM | POA: Insufficient documentation

## 2021-05-14 LAB — CBC WITH DIFFERENTIAL/PLATELET
Abs Immature Granulocytes: 0.08 10*3/uL — ABNORMAL HIGH (ref 0.00–0.07)
Basophils Absolute: 0 10*3/uL (ref 0.0–0.1)
Basophils Relative: 0 %
Eosinophils Absolute: 0 10*3/uL (ref 0.0–0.5)
Eosinophils Relative: 0 %
HCT: 32.5 % — ABNORMAL LOW (ref 36.0–46.0)
Hemoglobin: 9.6 g/dL — ABNORMAL LOW (ref 12.0–15.0)
Immature Granulocytes: 3 %
Lymphocytes Relative: 37 %
Lymphs Abs: 1.1 10*3/uL (ref 0.7–4.0)
MCH: 30.7 pg (ref 26.0–34.0)
MCHC: 29.5 g/dL — ABNORMAL LOW (ref 30.0–36.0)
MCV: 103.8 fL — ABNORMAL HIGH (ref 80.0–100.0)
Monocytes Absolute: 1.3 10*3/uL — ABNORMAL HIGH (ref 0.1–1.0)
Monocytes Relative: 45 %
Neutro Abs: 0.5 10*3/uL — ABNORMAL LOW (ref 1.7–7.7)
Neutrophils Relative %: 15 %
Platelets: 61 10*3/uL — ABNORMAL LOW (ref 150–400)
RBC: 3.13 MIL/uL — ABNORMAL LOW (ref 3.87–5.11)
RDW: 19.9 % — ABNORMAL HIGH (ref 11.5–15.5)
WBC: 3 10*3/uL — ABNORMAL LOW (ref 4.0–10.5)
nRBC: 3.7 % — ABNORMAL HIGH (ref 0.0–0.2)

## 2021-05-14 LAB — IRON AND TIBC
Iron: 35 ug/dL (ref 28–170)
Saturation Ratios: 22 % (ref 10.4–31.8)
TIBC: 157 ug/dL — ABNORMAL LOW (ref 250–450)
UIBC: 122 ug/dL

## 2021-05-14 LAB — COMPREHENSIVE METABOLIC PANEL
ALT: 15 U/L (ref 0–44)
AST: 25 U/L (ref 15–41)
Albumin: 2.5 g/dL — ABNORMAL LOW (ref 3.5–5.0)
Alkaline Phosphatase: 53 U/L (ref 38–126)
Anion gap: 8 (ref 5–15)
BUN: 12 mg/dL (ref 8–23)
CO2: 27 mmol/L (ref 22–32)
Calcium: 8.6 mg/dL — ABNORMAL LOW (ref 8.9–10.3)
Chloride: 96 mmol/L — ABNORMAL LOW (ref 98–111)
Creatinine, Ser: 0.73 mg/dL (ref 0.44–1.00)
GFR, Estimated: >60 mL/min (ref >60)
Glucose, Bld: 85 mg/dL (ref 70–99)
Potassium: 3.6 mmol/L (ref 3.5–5.1)
Sodium: 131 mmol/L — ABNORMAL LOW (ref 135–145)
Total Bilirubin: 0.7 mg/dL (ref 0.3–1.2)
Total Protein: 9.2 g/dL — ABNORMAL HIGH (ref 6.5–8.1)

## 2021-05-14 LAB — ABO/RH: ABO/RH(D): B POS

## 2021-05-14 LAB — SAMPLE TO BLOOD BANK

## 2021-05-14 LAB — VITAMIN B12: Vitamin B-12: 1510 pg/mL — ABNORMAL HIGH (ref 180–914)

## 2021-05-14 LAB — FERRITIN: Ferritin: 857 ng/mL — ABNORMAL HIGH (ref 11–307)

## 2021-05-14 MED ORDER — PREDNISONE 10 MG PO TABS: 10.0000 mg | ORAL_TABLET | Freq: Every day | ORAL | 1 refills | Status: DC

## 2021-05-14 NOTE — Assessment & Plan Note (Signed)
I spent almost 45 minutes with the patient and family, ordering tests and reviewing test results Overall, I believe of all her previous cancer diagnosis, myelodysplastic syndrome would be the most active disease at this point The patient likely has malignant cachexia and progressive pancytopenia related to this I recommend a trial of prednisone therapy Low-dose prednisone therapy can sometimes stabilize the bone marrow process and improve her blood counts Today, she does not need transfusion support I plan to start her on darbepoetin injection next week, with plan to keep her hemoglobin greater than 10 The plan of care is fully discussed with the patient's grandson who is a resident at the medicine/psychiatry residency program in [place]

## 2021-05-14 NOTE — Assessment & Plan Note (Signed)
Multifactorial, likely secondary to poor oral intake and medication effect I recommend discontinuation of furosemide Her trace leg edema is likely due to third spacing from low albumin status

## 2021-05-14 NOTE — Assessment & Plan Note (Signed)
Her anemia is multifactorial, likely secondary to anemia of chronic illness and her myelodysplastic syndrome I will repeat nutritional studies including B12, folate and iron studies along with erythropoietin levels Today, she does not need transfusion support As above, I plan to start her on erythropoietin stimulating agent next week

## 2021-05-14 NOTE — Assessment & Plan Note (Signed)
She has generalized weakness with profound weight loss I am hopeful, prednisone therapy will help with her appetite and energy When she improves, I will consult outpatient physical therapy

## 2021-05-14 NOTE — Assessment & Plan Note (Signed)
She has profound weight loss, over 70 pounds in the last 7 months due to poor appetite She has moderate protein calorie malnutrition I recommend trial of prednisone therapy I will reassess next week

## 2021-05-14 NOTE — Progress Notes (Signed)
[place] progress notes  Patient Care Team: Celene Squibb, MD as PCP - General (Internal Medicine) Gala Romney, Cristopher Estimable, MD as Consulting Physician (Gastroenterology)  CHIEF COMPLAINTS/PURPOSE OF VISIT:  Myelodysplastic syndrome, generalized weakness, failure to thrive, on background history of breast cancer and lung cancer  HISTORY OF PRESENTING ILLNESS:  Lisa [name] 87 y.o. female is seen because her primary oncologist is not available She has past history of breast cancer and lung cancer She also had diagnosis of myelodysplastic syndrome as well as abnormal MGUS I spoke with her, her daughter as well as her grandson who is a physician in [place] Her grandson provided a lot of history He is very concerned about her wellbeing and is thinking about flying from [place] to take care of her He is concerned about her poor oral intake; she used to be able to take care of herself and eat well Over the course of the last 7 months, she has lost over 73 pounds She was hospitalized at the end of last year with multiple tests, imaging study and bone marrow biopsy After hospitalization, she was discharged to skilled nursing facility but did not improve She felt weak Her appetite is poor The patient denies any recent signs or symptoms of bleeding such as spontaneous epistaxis, hematuria or hematochezia.   MEDICAL HISTORY:  Past Medical History:  Diagnosis Date   Adenocarcinoma of left lung ([place]) 2006   Arthritis    Asthma    Cancer of breast, female ([place])    Cancer of lung ([place])    Hypertension    Hypothyroidism    Invasive ductal carcinoma of left breast ([place]) 1999   Neutropenia ([place]) 06/09/2016   Personal history of radiation therapy     SURGICAL HISTORY: Past Surgical History:  Procedure Laterality Date   BIOPSY  10/23/2020   Procedure: BIOPSY;  Surgeon: Yetta Flock, MD;  Location: [facility] ENDOSCOPY;  Service: Gastroenterology;;   BREAST BIOPSY     left axillary  node dissection   BREAST LUMPECTOMY Left    COLONOSCOPY  03/08/2003   [id number] rectal polyps destroyed with the tip of the snare/Polyps at hepatic flexure, splenic flexure at 21 cm/Left-sided diverticula: unable to retrieve path   COLONOSCOPY  09/12/2008   [id number] rectum and distal sigmoid diminutive polyps/scattered left sided diverticulum. hyperplastic   COLONOSCOPY N/A 03/06/2013   [id number] polyp-removed as described above; colonic diverticulosis. hyperplastic polyps. next TCS 03/2018   ESOPHAGOGASTRODUODENOSCOPY (EGD) WITH PROPOFOL N/A 10/23/2020   Procedure: ESOPHAGOGASTRODUODENOSCOPY (EGD) WITH PROPOFOL;  Surgeon: Yetta Flock, MD;  Location: [facility];  Service: Gastroenterology;  Laterality: N/A;   FOOT SURGERY     LUNG REMOVAL, PARTIAL     upper lobe    SOCIAL HISTORY: Social History   Socioeconomic History   Marital status: Widowed    Spouse name: Not on file   Number of children: Not on file   Years of education: Not on file   Highest education level: Not on file  Occupational History   Not on file  Tobacco Use   Smoking status: Former    Packs/day: 0.75    Years: 35.00    Pack years: 26.25    Types: Cigarettes    Quit date: 51    Years since quitting: 29.5   Smokeless tobacco: Never   Tobacco comments:    smoke-free X 30 yeras  Vaping Use   Vaping Use: Never used  Substance and Sexual Activity   Alcohol use: No  Drug use: No   Sexual activity: Not Currently  Other Topics Concern   Not on file  Social History Narrative   Not on file   Social Determinants of Health   Financial Resource Strain: Not on file  Food Insecurity: Not on file  Transportation Needs: No Transportation Needs   Lack of Transportation (Medical): No   Lack of Transportation (Non-Medical): No  Physical Activity: Inactive   Days of Exercise per Week: 0 days   Minutes of Exercise per Session: 0 min  Stress: Not on file  Social Connections: Not on file   Intimate Partner Violence: Not At Risk   Fear of Current or Ex-Partner: No   Emotionally Abused: No   Physically Abused: No   Sexually Abused: No    FAMILY HISTORY: Family History  Problem Relation Age of Onset   Cancer Mother    Cancer Sister    Colon cancer Neg Hx     ALLERGIES:  is allergic to meloxicam and micardis hct [telmisartan-hctz].  MEDICATIONS:  Current Outpatient Medications  Medication Sig Dispense Refill   Iron-FA-B Cmp-C-Biot-Probiotic (FUSION PLUS) CAPS Take 1 capsule by mouth daily.     levothyroxine (SYNTHROID) 88 MCG tablet Take 88 mcg by mouth daily.     metoprolol succinate (TOPROL-XL) 50 MG 24 hr tablet Take 50 mg by mouth daily.     pantoprazole (PROTONIX) 40 MG tablet Take 1 tablet (40 mg total) by mouth daily. 30 tablet 0   polyethylene glycol (MIRALAX / GLYCOLAX) 17 g packet Take 17 g by mouth daily as needed for moderate constipation. 14 each 0   predniSONE (DELTASONE) 10 MG tablet Take 1 tablet (10 mg total) by mouth daily with breakfast. 30 tablet 1   senna-docusate (SENOKOT-S) 8.6-50 MG tablet Take 1 tablet by mouth at bedtime.     traZODone (DESYREL) 50 MG tablet Take 1 tablet (50 mg total) by mouth at bedtime. 30 tablet 0   umeclidinium bromide (INCRUSE ELLIPTA) 62.5 MCG/INH AEPB Inhale into the lungs daily.     Vitamin D, Ergocalciferol, (DRISDOL) 1.25 MG (50000 UNIT) CAPS capsule Take 50,000 Units by mouth once a week.     acetaminophen (TYLENOL) 325 MG tablet Take 2 tablets (650 mg total) by mouth every 6 (six) hours. (Patient not taking: Reported on 05/14/2021)     albuterol (PROVENTIL,VENTOLIN) 90 MCG/ACT inhaler Inhale 2 puffs into the lungs every 6 (six) hours as needed for wheezing or shortness of breath. (Patient not taking: Reported on 05/14/2021) 17 g 12   loratadine (CLARITIN) 10 MG tablet Take 10 mg by mouth at bedtime as needed. (Patient not taking: Reported on 05/14/2021)     traMADol (ULTRAM) 50 MG tablet Take 1 tablet (50 mg total) by  mouth every 6 (six) hours as needed for moderate pain. (Patient not taking: Reported on 05/14/2021) 30 tablet 0   No current facility-administered medications for this visit.    REVIEW OF SYSTEMS:   Constitutional: Denies fevers, chills or abnormal night sweats Eyes: Denies blurriness of vision, double vision or watery eyes Ears, nose, mouth, throat, and face: Denies mucositis or sore throat Respiratory: Denies cough, dyspnea or wheezes Cardiovascular: Denies palpitation, chest discomfort Gastrointestinal:  Denies nausea, heartburn or change in bowel habits Skin: Denies abnormal skin rashes Lymphatics: Denies new lymphadenopathy or easy bruising Behavioral/Psych: Mood is stable, no new changes  All other systems were reviewed with the patient and are negative.  PHYSICAL EXAMINATION: ECOG PERFORMANCE STATUS: 3 - Symptomatic, >50% confined to  bed  Vitals:   05/14/21 1326  BP: 127/65  Pulse: 70  Resp: 18  Temp: (!) 96.8 F (36 C)  SpO2: 96%   Filed Weights   05/14/21 1326  Weight: 139 lb 6.4 oz (63.2 kg)    GENERAL:alert, no distress and comfortable.  She looks frail, thin and cachectic SKIN: skin color, texture, turgor are normal, no rashes or significant lesions EYES: normal, conjunctiva are pale and non-injected, sclera clear OROPHARYNX:no exudate, normal lips, buccal mucosa, and tongue  NECK: supple, thyroid normal size, non-tender, without nodularity LYMPH:  no palpable lymphadenopathy in the cervical, axillary or inguinal LUNGS: clear to auscultation and percussion with normal breathing effort HEART: regular rate & rhythm and no murmurs without lower extremity edema ABDOMEN:abdomen soft, non-tender and normal bowel sounds Musculoskeletal:no cyanosis of digits and no clubbing  PSYCH: alert & oriented x 3 with fluent speech NEURO: no focal motor/sensory deficits  LABORATORY DATA:  I have reviewed the data as listed Lab Results  Component Value Date   WBC 3.0 (L)  05/14/2021   HGB 9.6 (L) 05/14/2021   HCT 32.5 (L) 05/14/2021   MCV 103.8 (H) 05/14/2021   PLT 61 (L) 05/14/2021   Recent Labs    07/09/20 1050 10/21/20 1123 10/21/20 2000 10/27/20 0933 10/28/20 0118 03/30/21 2245 04/16/21 1522 05/14/21 1446  NA 136 122*   < > 124*   < > 127* 130* 131*  K 4.2 3.8   < > 4.9   < > 4.0 4.0 3.6  CL 102 88*   < > 94*   < > 92* 95* 96*  CO2 24 21*   < > 21*   < > '28 29 27  ' GLUCOSE 97 121*   < > 87   < > 105* 102* 85  BUN 30* 30*   < > 31*   < > 13 7* 12  CREATININE 1.32* 1.53*   < > 1.39*   < > 0.83 0.71 0.73  CALCIUM 9.2 9.4   < > 7.9*   < > 8.2* 8.6* 8.6*  GFRNONAA 37* 33*   < > 37*   < > >60 >60 >60  GFRAA 43*  --   --   --   --   --   --   --   PROT 8.8* 8.1  --  6.2*   < > 9.1* 8.1 9.2*  ALBUMIN 4.1 3.3*   < > 2.2*   < > 2.5* 2.1* 2.5*  AST 20 20  --  23   < > '17 22 25  ' ALT 12 13  --  16   < > '10 12 15  ' ALKPHOS 46 36*  --  44   < > 54 51 53  BILITOT 1.3* 1.5*  --  0.7   < > 1.1 0.8 0.7  BILIDIR  --  0.3*  --  0.2  --   --   --   --   IBILI  --  1.2*  --  0.5  --   --   --   --    < > = values in this interval not displayed.    ASSESSMENT & PLAN:  MDS (myelodysplastic syndrome) ([place]) I spent almost 45 minutes with the patient and family, ordering tests and reviewing test results Overall, I believe of all her previous cancer diagnosis, myelodysplastic syndrome would be the most active disease at this point The patient likely has malignant cachexia and progressive pancytopenia related to  this I recommend a trial of prednisone therapy Low-dose prednisone therapy can sometimes stabilize the bone marrow process and improve her blood counts Today, she does not need transfusion support I plan to start her on darbepoetin injection next week, with plan to keep her hemoglobin greater than 10 The plan of care is fully discussed with the patient's grandson who is a resident at the medicine/psychiatry residency program in [place]  Deficiency anemia Her  anemia is multifactorial, likely secondary to anemia of chronic illness and her myelodysplastic syndrome I will repeat nutritional studies including B12, folate and iron studies along with erythropoietin levels Today, she does not need transfusion support As above, I plan to start her on erythropoietin stimulating agent next week  Malnutrition of moderate degree She has profound weight loss, over 70 pounds in the last 7 months due to poor appetite She has moderate protein calorie malnutrition I recommend trial of prednisone therapy I will reassess next week  Hyponatremia Multifactorial, likely secondary to poor oral intake and medication effect I recommend discontinuation of furosemide Her trace leg edema is likely due to third spacing from low albumin status  Generalized muscle weakness She has generalized weakness with profound weight loss I am hopeful, prednisone therapy will help with her appetite and energy When she improves, I will consult outpatient physical therapy  Orders Placed This Encounter  Procedures   CBC with Differential/Platelet    Standing Status:   Future    Number of Occurrences:   1    Standing Expiration Date:   05/14/2022   Comprehensive metabolic panel    Standing Status:   Future    Number of Occurrences:   1    Standing Expiration Date:   05/14/2022   Erythropoietin    Standing Status:   Future    Number of Occurrences:   1    Standing Expiration Date:   05/14/2022   Vitamin B12    Standing Status:   Future    Number of Occurrences:   1    Standing Expiration Date:   05/14/2022   Iron and TIBC    Standing Status:   Future    Number of Occurrences:   1    Standing Expiration Date:   05/14/2022   Ferritin    Standing Status:   Future    Number of Occurrences:   1    Standing Expiration Date:   05/14/2022   Folate RBC    Standing Status:   Future    Number of Occurrences:   1    Standing Expiration Date:   05/14/2022   Sample to Blood Bank     Standing Status:   Future    Number of Occurrences:   1    Standing Expiration Date:   05/14/2022   ABO/Rh    Standing Status:   Future    Number of Occurrences:   1    Standing Expiration Date:   05/14/2022    All questions were answered. The patient knows to call the clinic with any problems, questions or concerns. The total time spent in the appointment was 40 minutes encounter with patients including review of chart and various tests results, discussions about plan of care and coordination of care plan   Heath Lark, MD 05/14/2021 4:58 PM

## 2021-05-15 LAB — FOLATE RBC
Folate, Hemolysate: 382 ng/mL
Folate, RBC: 1273 ng/mL (ref >498)
Hematocrit: 30 % — ABNORMAL LOW (ref 34.0–46.6)

## 2021-05-15 LAB — ERYTHROPOIETIN: Erythropoietin: 37.8 m[IU]/mL — ABNORMAL HIGH (ref 2.6–18.5)

## 2021-05-21 ENCOUNTER — Ambulatory Visit (HOSPITAL_COMMUNITY): Payer: Medicare Other

## 2021-05-21 ENCOUNTER — Ambulatory Visit (HOSPITAL_COMMUNITY): Payer: Medicare Other | Admitting: Hematology and Oncology

## 2021-05-21 ENCOUNTER — Other Ambulatory Visit (HOSPITAL_COMMUNITY): Payer: Self-pay

## 2021-05-21 ENCOUNTER — Inpatient Hospital Stay (HOSPITAL_COMMUNITY): Payer: Medicare Other

## 2021-05-21 DIAGNOSIS — D539 Nutritional anemia, unspecified: Secondary | ICD-10-CM

## 2021-05-21 DIAGNOSIS — D61818 Other pancytopenia: Secondary | ICD-10-CM

## 2021-05-28 ENCOUNTER — Inpatient Hospital Stay (HOSPITAL_BASED_OUTPATIENT_CLINIC_OR_DEPARTMENT_OTHER): Payer: Medicare Other | Admitting: Hematology and Oncology

## 2021-05-28 ENCOUNTER — Other Ambulatory Visit: Payer: Self-pay

## 2021-05-28 ENCOUNTER — Encounter (HOSPITAL_COMMUNITY): Payer: Self-pay | Admitting: Hematology and Oncology

## 2021-05-28 ENCOUNTER — Other Ambulatory Visit (HOSPITAL_COMMUNITY): Payer: Self-pay

## 2021-05-28 ENCOUNTER — Inpatient Hospital Stay (HOSPITAL_COMMUNITY): Payer: Medicare Other

## 2021-05-28 DIAGNOSIS — E440 Moderate protein-calorie malnutrition: Secondary | ICD-10-CM | POA: Diagnosis not present

## 2021-05-28 DIAGNOSIS — D469 Myelodysplastic syndrome, unspecified: Secondary | ICD-10-CM

## 2021-05-28 DIAGNOSIS — D539 Nutritional anemia, unspecified: Secondary | ICD-10-CM

## 2021-05-28 DIAGNOSIS — M6281 Muscle weakness (generalized): Secondary | ICD-10-CM

## 2021-05-28 DIAGNOSIS — R531 Weakness: Secondary | ICD-10-CM | POA: Diagnosis not present

## 2021-05-28 DIAGNOSIS — D61818 Other pancytopenia: Secondary | ICD-10-CM

## 2021-05-28 DIAGNOSIS — C50912 Malignant neoplasm of unspecified site of left female breast: Secondary | ICD-10-CM

## 2021-05-28 DIAGNOSIS — E871 Hypo-osmolality and hyponatremia: Secondary | ICD-10-CM | POA: Diagnosis not present

## 2021-05-28 LAB — CBC WITH DIFFERENTIAL/PLATELET
Abs Immature Granulocytes: 0.17 10*3/uL — ABNORMAL HIGH (ref 0.00–0.07)
Basophils Absolute: 0 10*3/uL (ref 0.0–0.1)
Basophils Relative: 0 %
Eosinophils Absolute: 0 10*3/uL (ref 0.0–0.5)
Eosinophils Relative: 0 %
HCT: 29.2 % — ABNORMAL LOW (ref 36.0–46.0)
Hemoglobin: 8.9 g/dL — ABNORMAL LOW (ref 12.0–15.0)
Immature Granulocytes: 3 %
Lymphocytes Relative: 25 %
Lymphs Abs: 1.2 10*3/uL (ref 0.7–4.0)
MCH: 31.1 pg (ref 26.0–34.0)
MCHC: 30.5 g/dL (ref 30.0–36.0)
MCV: 102.1 fL — ABNORMAL HIGH (ref 80.0–100.0)
Monocytes Absolute: 2.7 10*3/uL — ABNORMAL HIGH (ref 0.1–1.0)
Monocytes Relative: 54 %
Neutro Abs: 0.9 10*3/uL — ABNORMAL LOW (ref 1.7–7.7)
Neutrophils Relative %: 18 %
Platelets: 107 10*3/uL — ABNORMAL LOW (ref 150–400)
RBC: 2.86 MIL/uL — ABNORMAL LOW (ref 3.87–5.11)
RDW: 19 % — ABNORMAL HIGH (ref 11.5–15.5)
WBC: 5 10*3/uL (ref 4.0–10.5)
nRBC: 1.2 % — ABNORMAL HIGH (ref 0.0–0.2)

## 2021-05-28 LAB — COMPREHENSIVE METABOLIC PANEL
ALT: 18 U/L (ref 0–44)
AST: 21 U/L (ref 15–41)
Albumin: 2.5 g/dL — ABNORMAL LOW (ref 3.5–5.0)
Alkaline Phosphatase: 42 U/L (ref 38–126)
Anion gap: 6 (ref 5–15)
BUN: 13 mg/dL (ref 8–23)
CO2: 26 mmol/L (ref 22–32)
Calcium: 9.5 mg/dL (ref 8.9–10.3)
Chloride: 95 mmol/L — ABNORMAL LOW (ref 98–111)
Creatinine, Ser: 0.71 mg/dL (ref 0.44–1.00)
GFR, Estimated: >60 mL/min (ref >60)
Glucose, Bld: 113 mg/dL — ABNORMAL HIGH (ref 70–99)
Potassium: 3.9 mmol/L (ref 3.5–5.1)
Sodium: 127 mmol/L — ABNORMAL LOW (ref 135–145)
Total Bilirubin: 0.8 mg/dL (ref 0.3–1.2)
Total Protein: 8 g/dL (ref 6.5–8.1)

## 2021-05-28 LAB — SAMPLE TO BLOOD BANK

## 2021-05-28 MED ORDER — DARBEPOETIN ALFA 300 MCG/0.6ML IJ SOSY
200.0000 ug | PREFILLED_SYRINGE | Freq: Once | INTRAMUSCULAR | Status: AC
  Administered 2021-05-28: 200 ug via SUBCUTANEOUS
  Filled 2021-05-28: qty 0.6

## 2021-05-28 MED ORDER — MIRTAZAPINE 7.5 MG PO TABS: 7.5000 mg | ORAL_TABLET | Freq: Every day | ORAL | 2 refills | Status: DC

## 2021-05-28 NOTE — Progress Notes (Signed)
[facility] OFFICE PROGRESS NOTE  Patient Care Team: Celene Squibb, MD as PCP - General (Internal Medicine) Gala Romney, Cristopher Estimable, MD as Consulting Physician (Gastroenterology)  ASSESSMENT & PLAN:  MDS (myelodysplastic syndrome) ([place]) Her pancytopenia has improved with low-dose prednisone She will continue the same She does not need transfusion support She will receive darbepoetin injection every 2 weeks If her blood counts continue to improve in the next visit, we can start to initiate prednisone taper  Malnutrition of moderate degree Her appetite is improving and she has gained some weight She will continue current dose of prednisone Per discussion with her grandson, we will discontinue trazodone and switch her to mirtazapine I encouraged the patient to continue on frequent small meals   Generalized muscle weakness Overall, she continues to have generalized weakness Overall, family members have noted she has improved energy and appetite I am hopeful she will get stronger in the near future; we will consult physical therapy and rehab next month if she continues to improve  No orders of the defined types were placed in this encounter.   All questions were answered. The patient knows to call the clinic with any problems, questions or concerns. The total time spent in the appointment was 20 minutes encounter with patients including review of chart and various tests results, discussions about plan of care and coordination of care plan   Heath Lark, MD 05/28/2021 4:50 PM  INTERVAL HISTORY: Please see below for problem oriented charting. She returns with her daughter for further follow-up Her grandson is available over the phone He stated that she has mild intermittent confusion at times but overall improved Her energy level has improved and she is eating better and has gained some weight No recent falls No recent bleeding  SUMMARY OF ONCOLOGIC HISTORY: Oncology History  Overview Note  Called Dr Juel Burrow office again now, office closed. Pt will see Dr Delton Coombes and I will ask him if he can talk to Dr Nevada Crane after his visit.  Thanks,   Adenocarcinoma of left lung ([place])  06/23/2016 Imaging   CT chest- Stable exam.  No new or progressive findings. Previously described tiny perifissural nodules at the base of the right major fissure are stable and likely represent subpleural lymph nodes.      REVIEW OF SYSTEMS:   Constitutional: Denies fevers, chills or abnormal weight loss Eyes: Denies blurriness of vision Ears, nose, mouth, throat, and face: Denies mucositis or sore throat Respiratory: Denies cough, dyspnea or wheezes Cardiovascular: Denies palpitation, chest discomfort or lower extremity swelling Gastrointestinal:  Denies nausea, heartburn or change in bowel habits Skin: Denies abnormal skin rashes Lymphatics: Denies new lymphadenopathy or easy bruising Neurological:Denies numbness, tingling or new weaknesses Behavioral/Psych: Mood is stable, no new changes  All other systems were reviewed with the patient and are negative.  I have reviewed the past medical history, past surgical history, social history and family history with the patient and they are unchanged from previous note.  ALLERGIES:  is allergic to meloxicam and micardis hct [telmisartan-hctz].  MEDICATIONS:  Current Outpatient Medications  Medication Sig Dispense Refill   mirtazapine (REMERON) 7.5 MG tablet Take 1 tablet (7.5 mg total) by mouth at bedtime. 30 tablet 2   acetaminophen (TYLENOL) 325 MG tablet Take 2 tablets (650 mg total) by mouth every 6 (six) hours. (Patient not taking: No sig reported)     albuterol (PROVENTIL,VENTOLIN) 90 MCG/ACT inhaler Inhale 2 puffs into the lungs every 6 (six) hours as needed for  wheezing or shortness of breath. (Patient not taking: No sig reported) 17 g 12   furosemide (LASIX) 20 MG tablet 1 tablet     Iron-FA-B Cmp-C-Biot-Probiotic (FUSION PLUS)  CAPS Take 1 capsule by mouth daily.     levothyroxine (SYNTHROID) 100 MCG tablet 1 tablet in the morning on an empty stomach     loratadine (CLARITIN) 10 MG tablet Take 10 mg by mouth at bedtime as needed. (Patient not taking: No sig reported)     Magnesium 400 MG TABS See admin instructions.     metoprolol succinate (TOPROL-XL) 50 MG 24 hr tablet Take 50 mg by mouth daily.     pantoprazole (PROTONIX) 40 MG tablet Take 1 tablet (40 mg total) by mouth daily. 30 tablet 0   polyethylene glycol (MIRALAX / GLYCOLAX) 17 g packet Take 17 g by mouth daily as needed for moderate constipation. 14 each 0   predniSONE (DELTASONE) 10 MG tablet Take 1 tablet (10 mg total) by mouth daily with breakfast. 30 tablet 1   senna-docusate (SENOKOT-S) 8.6-50 MG tablet Take 1 tablet by mouth at bedtime.     traMADol (ULTRAM) 50 MG tablet Take 1 tablet (50 mg total) by mouth every 6 (six) hours as needed for moderate pain. (Patient not taking: No sig reported) 30 tablet 0   umeclidinium bromide (INCRUSE ELLIPTA) 62.5 MCG/INH AEPB Inhale into the lungs daily.     Vitamin D, Ergocalciferol, (DRISDOL) 1.25 MG (50000 UNIT) CAPS capsule Take 50,000 Units by mouth once a week.     No current facility-administered medications for this visit.    PHYSICAL EXAMINATION: ECOG PERFORMANCE STATUS: 2 - Symptomatic, <50% confined to bed  Vitals:   05/28/21 1209  BP: (!) 141/76  Resp: 18  Temp: (!) 96.7 F (35.9 C)  SpO2: 95%   Filed Weights   05/28/21 1209  Weight: 141 lb 3.2 oz (64 kg)    GENERAL:alert, no distress and comfortable SKIN: skin color, texture, turgor are normal, no rashes or significant lesions EYES: normal, Conjunctiva are pink and non-injected, sclera clear OROPHARYNX:no exudate, no erythema and lips, buccal mucosa, and tongue normal  NECK: supple, thyroid normal size, non-tender, without nodularity LYMPH:  no palpable lymphadenopathy in the cervical, axillary or inguinal LUNGS: clear to auscultation  and percussion with normal breathing effort HEART: regular rate & rhythm and no murmurs with mild bilateral lower extremity edema ABDOMEN:abdomen soft, non-tender and normal bowel sounds Musculoskeletal:no cyanosis of digits and no clubbing  NEURO: alert & oriented x 3 with fluent speech, no focal motor/sensory deficits  LABORATORY DATA:  I have reviewed the data as listed    Component Value Date/Time   NA 127 (L) 05/28/2021 1051   K 3.9 05/28/2021 1051   CL 95 (L) 05/28/2021 1051   CO2 26 05/28/2021 1051   GLUCOSE 113 (H) 05/28/2021 1051   BUN 13 05/28/2021 1051   CREATININE 0.71 05/28/2021 1051   CALCIUM 9.5 05/28/2021 1051   PROT 8.0 05/28/2021 1051   ALBUMIN 2.5 (L) 05/28/2021 1051   AST 21 05/28/2021 1051   ALT 18 05/28/2021 1051   ALKPHOS 42 05/28/2021 1051   BILITOT 0.8 05/28/2021 1051   GFRNONAA >60 05/28/2021 1051   GFRAA 43 (L) 07/09/2020 1050    No results found for: SPEP, UPEP  Lab Results  Component Value Date   WBC 5.0 05/28/2021   NEUTROABS 0.9 (L) 05/28/2021   HGB 8.9 (L) 05/28/2021   HCT 29.2 (L) 05/28/2021   MCV 102.1 (  H) 05/28/2021   PLT 107 (L) 05/28/2021      Chemistry      Component Value Date/Time   NA 127 (L) 05/28/2021 1051   K 3.9 05/28/2021 1051   CL 95 (L) 05/28/2021 1051   CO2 26 05/28/2021 1051   BUN 13 05/28/2021 1051   CREATININE 0.71 05/28/2021 1051      Component Value Date/Time   CALCIUM 9.5 05/28/2021 1051   ALKPHOS 42 05/28/2021 1051   AST 21 05/28/2021 1051   ALT 18 05/28/2021 1051   BILITOT 0.8 05/28/2021 1051

## 2021-05-28 NOTE — Assessment & Plan Note (Signed)
Overall, she continues to have generalized weakness Overall, family members have noted she has improved energy and appetite I am hopeful she will get stronger in the near future; we will consult physical therapy and rehab next month if she continues to improve

## 2021-05-28 NOTE — Assessment & Plan Note (Signed)
Her pancytopenia has improved with low-dose prednisone She will continue the same She does not need transfusion support She will receive darbepoetin injection every 2 weeks If her blood counts continue to improve in the next visit, we can start to initiate prednisone taper

## 2021-05-28 NOTE — Assessment & Plan Note (Signed)
Her appetite is improving and she has gained some weight She will continue current dose of prednisone Per discussion with her grandson, we will discontinue trazodone and switch her to mirtazapine I encouraged the patient to continue on frequent small meals

## 2021-05-28 NOTE — Progress Notes (Signed)
Lisa [name] presents today for injection per the provider's orders.  Aranesp administration without incident; injection site WNL; see [name] for injection details.  Pt remained stable throughout. Patient tolerated procedure well and without incident.  No questions or complaints noted at this time. Discharged via wheelchair in c/o family in stable condition.

## 2021-05-28 NOTE — Patient Instructions (Signed)
[facility]  Discharge Instructions: Thank you for choosing [facility] to provide your oncology and hematology care.  If you have a lab appointment with the [facility], please come in thru the Main Entrance and check in at the main information desk.  Wear comfortable clothing and clothing appropriate for easy access to any Portacath or PICC line.   We strive to give you quality time with your provider. You may need to reschedule your appointment if you arrive late (15 or more minutes).  Arriving late affects you and other patients whose appointments are after yours.  Also, if you miss three or more appointments without notifying the office, you may be dismissed from the clinic at the provider's discretion.      For prescription refill requests, have your pharmacy contact our office and allow 72 hours for refills to be completed.    Today you received the following injection:  Aranesp      To help prevent nausea and vomiting after your treatment, we encourage you to take your nausea medication as directed.  BELOW ARE SYMPTOMS THAT SHOULD BE REPORTED IMMEDIATELY: *FEVER GREATER THAN 100.4 F (38 C) OR HIGHER *CHILLS OR SWEATING *NAUSEA AND VOMITING THAT IS NOT CONTROLLED WITH YOUR NAUSEA MEDICATION *UNUSUAL SHORTNESS OF BREATH *UNUSUAL BRUISING OR BLEEDING *URINARY PROBLEMS (pain or burning when urinating, or frequent urination) *BOWEL PROBLEMS (unusual diarrhea, constipation, pain near the anus) TENDERNESS IN MOUTH AND THROAT WITH OR WITHOUT PRESENCE OF ULCERS (sore throat, sores in mouth, or a toothache) UNUSUAL RASH, SWELLING OR PAIN  UNUSUAL VAGINAL DISCHARGE OR ITCHING   Items with * indicate a potential emergency and should be followed up as soon as possible or go to the Emergency Department if any problems should occur.  Please show the CHEMOTHERAPY ALERT CARD or IMMUNOTHERAPY ALERT CARD at check-in to the Emergency Department and triage  nurse.  Should you have questions after your visit or need to cancel or reschedule your appointment, please contact [facility] [phone]  and follow the prompts.  Office hours are 8:00 a.m. to 4:30 p.m. Monday - Friday. Please note that voicemails left after 4:00 p.m. may not be returned until the following business day.  We are closed weekends and major holidays. You have access to a nurse at all times for urgent questions. Please call the main number to the clinic [phone] and follow the prompts.  For any non-urgent questions, you may also contact your provider using MyChart. We now offer e-Visits for anyone 56 and older to request care online for non-urgent symptoms. For details visit [email or web address].   Also download the MyChart app! Go to the app store, search "MyChart", open the app, select [facility], and log in with your MyChart username and password.  Due to Covid, a mask is required upon entering the hospital/clinic. If you do not have a mask, one will be given to you upon arrival. For doctor visits, patients may have 1 support person aged 60 or older with them. For treatment visits, patients cannot have anyone with them due to current Covid guidelines and our immunocompromised population.

## 2021-05-29 ENCOUNTER — Other Ambulatory Visit (HOSPITAL_COMMUNITY): Payer: Self-pay

## 2021-05-29 DIAGNOSIS — D469 Myelodysplastic syndrome, unspecified: Secondary | ICD-10-CM

## 2021-06-03 DIAGNOSIS — Z20822 Contact with and (suspected) exposure to covid-19: Secondary | ICD-10-CM | POA: Diagnosis not present

## 2021-06-10 DIAGNOSIS — H6123 Impacted cerumen, bilateral: Secondary | ICD-10-CM | POA: Diagnosis not present

## 2021-06-11 ENCOUNTER — Encounter (HOSPITAL_COMMUNITY): Payer: Self-pay | Admitting: Hematology and Oncology

## 2021-06-11 ENCOUNTER — Encounter (HOSPITAL_COMMUNITY): Payer: Self-pay

## 2021-06-11 ENCOUNTER — Inpatient Hospital Stay (HOSPITAL_COMMUNITY): Payer: Medicare Other

## 2021-06-11 ENCOUNTER — Inpatient Hospital Stay (HOSPITAL_COMMUNITY): Payer: Medicare Other | Attending: Hematology

## 2021-06-11 ENCOUNTER — Other Ambulatory Visit: Payer: Self-pay

## 2021-06-11 ENCOUNTER — Other Ambulatory Visit (HOSPITAL_COMMUNITY): Payer: Self-pay

## 2021-06-11 VITALS — BP 123/49 | HR 67 | Temp 98.0°F | Resp 18

## 2021-06-11 DIAGNOSIS — E46 Unspecified protein-calorie malnutrition: Secondary | ICD-10-CM | POA: Insufficient documentation

## 2021-06-11 DIAGNOSIS — D539 Nutritional anemia, unspecified: Secondary | ICD-10-CM

## 2021-06-11 DIAGNOSIS — Z853 Personal history of malignant neoplasm of breast: Secondary | ICD-10-CM | POA: Insufficient documentation

## 2021-06-11 DIAGNOSIS — Z79899 Other long term (current) drug therapy: Secondary | ICD-10-CM | POA: Diagnosis not present

## 2021-06-11 DIAGNOSIS — R5383 Other fatigue: Secondary | ICD-10-CM | POA: Diagnosis not present

## 2021-06-11 DIAGNOSIS — Z85118 Personal history of other malignant neoplasm of bronchus and lung: Secondary | ICD-10-CM | POA: Diagnosis not present

## 2021-06-11 DIAGNOSIS — M6281 Muscle weakness (generalized): Secondary | ICD-10-CM | POA: Insufficient documentation

## 2021-06-11 DIAGNOSIS — R059 Cough, unspecified: Secondary | ICD-10-CM | POA: Insufficient documentation

## 2021-06-11 DIAGNOSIS — D61818 Other pancytopenia: Secondary | ICD-10-CM | POA: Diagnosis not present

## 2021-06-11 DIAGNOSIS — Z888 Allergy status to other drugs, medicaments and biological substances status: Secondary | ICD-10-CM | POA: Diagnosis not present

## 2021-06-11 DIAGNOSIS — R200 Anesthesia of skin: Secondary | ICD-10-CM | POA: Diagnosis not present

## 2021-06-11 DIAGNOSIS — D469 Myelodysplastic syndrome, unspecified: Secondary | ICD-10-CM | POA: Insufficient documentation

## 2021-06-11 DIAGNOSIS — E871 Hypo-osmolality and hyponatremia: Secondary | ICD-10-CM | POA: Diagnosis not present

## 2021-06-11 DIAGNOSIS — Z87440 Personal history of urinary (tract) infections: Secondary | ICD-10-CM | POA: Insufficient documentation

## 2021-06-11 DIAGNOSIS — G629 Polyneuropathy, unspecified: Secondary | ICD-10-CM | POA: Insufficient documentation

## 2021-06-11 DIAGNOSIS — K5900 Constipation, unspecified: Secondary | ICD-10-CM | POA: Insufficient documentation

## 2021-06-11 LAB — COMPREHENSIVE METABOLIC PANEL
ALT: 20 U/L (ref 0–44)
AST: 28 U/L (ref 15–41)
Albumin: 2.6 g/dL — ABNORMAL LOW (ref 3.5–5.0)
Alkaline Phosphatase: 46 U/L (ref 38–126)
Anion gap: 6 (ref 5–15)
BUN: 11 mg/dL (ref 8–23)
CO2: 30 mmol/L (ref 22–32)
Calcium: 8.4 mg/dL — ABNORMAL LOW (ref 8.9–10.3)
Chloride: 98 mmol/L (ref 98–111)
Creatinine, Ser: 0.65 mg/dL (ref 0.44–1.00)
GFR, Estimated: >60 mL/min (ref >60)
Glucose, Bld: 106 mg/dL — ABNORMAL HIGH (ref 70–99)
Potassium: 3.4 mmol/L — ABNORMAL LOW (ref 3.5–5.1)
Sodium: 134 mmol/L — ABNORMAL LOW (ref 135–145)
Total Bilirubin: 0.8 mg/dL (ref 0.3–1.2)
Total Protein: 8.3 g/dL — ABNORMAL HIGH (ref 6.5–8.1)

## 2021-06-11 LAB — CBC WITH DIFFERENTIAL/PLATELET
Abs Immature Granulocytes: 0.14 10*3/uL — ABNORMAL HIGH (ref 0.00–0.07)
Basophils Absolute: 0 10*3/uL (ref 0.0–0.1)
Basophils Relative: 0 %
Eosinophils Absolute: 0 10*3/uL (ref 0.0–0.5)
Eosinophils Relative: 0 %
HCT: 30.8 % — ABNORMAL LOW (ref 36.0–46.0)
Hemoglobin: 8.8 g/dL — ABNORMAL LOW (ref 12.0–15.0)
Immature Granulocytes: 4 %
Lymphocytes Relative: 26 %
Lymphs Abs: 0.9 10*3/uL (ref 0.7–4.0)
MCH: 30.8 pg (ref 26.0–34.0)
MCHC: 28.6 g/dL — ABNORMAL LOW (ref 30.0–36.0)
MCV: 107.7 fL — ABNORMAL HIGH (ref 80.0–100.0)
Monocytes Absolute: 1.7 10*3/uL — ABNORMAL HIGH (ref 0.1–1.0)
Monocytes Relative: 50 %
Neutro Abs: 0.7 10*3/uL — ABNORMAL LOW (ref 1.7–7.7)
Neutrophils Relative %: 20 %
Platelets: 59 10*3/uL — ABNORMAL LOW (ref 150–400)
RBC: 2.86 MIL/uL — ABNORMAL LOW (ref 3.87–5.11)
RDW: 18.6 % — ABNORMAL HIGH (ref 11.5–15.5)
WBC: 3.5 10*3/uL — ABNORMAL LOW (ref 4.0–10.5)
nRBC: 2.6 % — ABNORMAL HIGH (ref 0.0–0.2)

## 2021-06-11 LAB — SAMPLE TO BLOOD BANK

## 2021-06-11 MED ORDER — DARBEPOETIN ALFA 300 MCG/0.6ML IJ SOSY
200.0000 ug | PREFILLED_SYRINGE | Freq: Once | INTRAMUSCULAR | Status: AC
  Administered 2021-06-11: 200 ug via SUBCUTANEOUS
  Filled 2021-06-11: qty 0.6

## 2021-06-11 NOTE — Progress Notes (Signed)
Patient tolerated injection with no complaints voiced. Site clean and dry with no bruising or swelling noted at site. See [name] for details. Band aid applied.  Patient stable during and after injection. VSS with discharge and left in satisfactory condition with no s/s of distress noted.  

## 2021-06-11 NOTE — Patient Instructions (Signed)
[facility]  Discharge Instructions: Thank you for choosing [facility] to provide your oncology and hematology care.  If you have a lab appointment with the [facility], please come in thru the Main Entrance and check in at the main information desk.  Wear comfortable clothing and clothing appropriate for easy access to any Portacath or PICC line.   We strive to give you quality time with your provider. You may need to reschedule your appointment if you arrive late (15 or more minutes).  Arriving late affects you and other patients whose appointments are after yours.  Also, if you miss three or more appointments without notifying the office, you may be dismissed from the clinic at the provider's discretion.      For prescription refill requests, have your pharmacy contact our office and allow 72 hours for refills to be completed.    Today you received the following: Aranesp. Return as scheduled.    To help prevent nausea and vomiting after your treatment, we encourage you to take your nausea medication as directed.  BELOW ARE SYMPTOMS THAT SHOULD BE REPORTED IMMEDIATELY: *FEVER GREATER THAN 100.4 F (38 C) OR HIGHER *CHILLS OR SWEATING *NAUSEA AND VOMITING THAT IS NOT CONTROLLED WITH YOUR NAUSEA MEDICATION *UNUSUAL SHORTNESS OF BREATH *UNUSUAL BRUISING OR BLEEDING *URINARY PROBLEMS (pain or burning when urinating, or frequent urination) *BOWEL PROBLEMS (unusual diarrhea, constipation, pain near the anus) TENDERNESS IN MOUTH AND THROAT WITH OR WITHOUT PRESENCE OF ULCERS (sore throat, sores in mouth, or a toothache) UNUSUAL RASH, SWELLING OR PAIN  UNUSUAL VAGINAL DISCHARGE OR ITCHING   Items with * indicate a potential emergency and should be followed up as soon as possible or go to the Emergency Department if any problems should occur.  Please show the CHEMOTHERAPY ALERT CARD or IMMUNOTHERAPY ALERT CARD at check-in to the Emergency Department and triage  nurse.  Should you have questions after your visit or need to cancel or reschedule your appointment, please contact [facility] [phone]  and follow the prompts.  Office hours are 8:00 a.m. to 4:30 p.m. Monday - Friday. Please note that voicemails left after 4:00 p.m. may not be returned until the following business day.  We are closed weekends and major holidays. You have access to a nurse at all times for urgent questions. Please call the main number to the clinic [phone] and follow the prompts.  For any non-urgent questions, you may also contact your provider using MyChart. We now offer e-Visits for anyone 36 and older to request care online for non-urgent symptoms. For details visit [email or web address].   Also download the MyChart app! Go to the app store, search "MyChart", open the app, select [facility], and log in with your MyChart username and password.  Due to Covid, a mask is required upon entering the hospital/clinic. If you do not have a mask, one will be given to you upon arrival. For doctor visits, patients may have 1 support person aged 8 or older with them. For treatment visits, patients cannot have anyone with them due to current Covid guidelines and our immunocompromised population.

## 2021-06-24 NOTE — Progress Notes (Signed)
[name] [address], [place] [address]   Patient Care Team: Celene Squibb, MD as PCP - General (Internal Medicine) [name], MD as Consulting Physician (Gastroenterology)  SUMMARY OF ONCOLOGIC HISTORY: Oncology History Overview Note  Called Dr Juel Burrow office again now, office closed. Pt will see Dr Delton Coombes and I will ask him if he can talk to Dr Nevada Crane after his visit.  Thanks,   Adenocarcinoma of left lung ([facility])  06/23/2016 Imaging   CT chest- Stable exam.  No new or progressive findings. Previously described tiny perifissural nodules at the base of the right major fissure are stable and likely represent subpleural lymph nodes.     CHIEF COMPLIANT: Follow-up for MDS and left breast cancer   INTERVAL HISTORY: [name] is a 87 y.o. female here today for follow up of her MDS and left breast cancer. Her last visit was on 03/03/2021.   Today she reports feeling well. She spends most of her day sitting down. She reports episodes of coughing at night for the past 2 weeks. She had a UTI through May-June; she has had 2 UTIs this year.   REVIEW OF SYSTEMS:   Review of Systems  Constitutional:  Positive for appetite change (60%) and fatigue (50%).  Respiratory:  Positive for cough (over last 2 weeks).   Gastrointestinal:  Positive for constipation.  Genitourinary:  Positive for difficulty urinating.   Neurological:  Positive for numbness (tingling hands and feet).  All other systems reviewed and are negative.  I have reviewed the past medical history, past surgical history, social history and family history with the patient and they are unchanged from previous note.   ALLERGIES:   is allergic to meloxicam and micardis hct [telmisartan-hctz].   MEDICATIONS:  Current Outpatient Medications  Medication Sig Dispense Refill   acetaminophen (TYLENOL) 325 MG tablet Take 2 tablets (650 mg total) by mouth every 6 (six) hours.     furosemide  (LASIX) 20 MG tablet 1 tablet     Iron-FA-B Cmp-C-Biot-Probiotic (FUSION PLUS) CAPS Take 1 capsule by mouth daily.     levothyroxine (SYNTHROID) 100 MCG tablet 1 tablet in the morning on an empty stomach     Magnesium 400 MG TABS See admin instructions.     metoprolol succinate (TOPROL-XL) 50 MG 24 hr tablet Take 50 mg by mouth daily.     mirtazapine (REMERON) 7.5 MG tablet Take 1 tablet (7.5 mg total) by mouth at bedtime. 30 tablet 2   pantoprazole (PROTONIX) 40 MG tablet Take 1 tablet (40 mg total) by mouth daily. 30 tablet 0   predniSONE (DELTASONE) 10 MG tablet Take 1 tablet (10 mg total) by mouth daily with breakfast. 30 tablet 1   senna-docusate (SENOKOT-S) 8.6-50 MG tablet Take 1 tablet by mouth at bedtime.     umeclidinium bromide (INCRUSE ELLIPTA) 62.5 MCG/INH AEPB Inhale into the lungs daily.     Vitamin D, Ergocalciferol, (DRISDOL) 1.25 MG (50000 UNIT) CAPS capsule Take 50,000 Units by mouth once a week.     albuterol (PROVENTIL,VENTOLIN) 90 MCG/ACT inhaler Inhale 2 puffs into the lungs every 6 (six) hours as needed for wheezing or shortness of breath. (Patient not taking: Reported on 06/25/2021) 17 g 12   loratadine (CLARITIN) 10 MG tablet Take 10 mg by mouth at bedtime as needed. (Patient not taking: Reported on 06/25/2021)     polyethylene glycol (MIRALAX / GLYCOLAX) 17 g packet Take 17 g by mouth daily as  needed for moderate constipation. (Patient not taking: Reported on 06/25/2021) 14 each 0   traMADol (ULTRAM) 50 MG tablet Take 1 tablet (50 mg total) by mouth every 6 (six) hours as needed for moderate pain. (Patient not taking: Reported on 06/25/2021) 30 tablet 0   No current facility-administered medications for this visit.     PHYSICAL EXAMINATION: Performance status (ECOG): 3 - Symptomatic, >50% confined to bed  Vitals:   06/25/21 1252  BP: (!) 116/52  Pulse: 70  Resp: 18  Temp: 97.7 F (36.5 C)  SpO2: 91%   Wt Readings from Last 3 Encounters:  06/25/21 136 lb 0.4 oz  (61.7 kg)  05/28/21 141 lb 3.2 oz (64 kg)  05/14/21 139 lb 6.4 oz (63.2 kg)   Physical Exam Vitals reviewed.  Constitutional:      Appearance: Normal appearance.     Comments: In wheelchair  Cardiovascular:     Rate and Rhythm: Normal rate and regular rhythm.     Pulses: Normal pulses.     Heart sounds: Normal heart sounds.  Pulmonary:     Effort: Pulmonary effort is normal.     Breath sounds: Normal breath sounds.  Neurological:     General: No focal deficit present.     Mental Status: She is alert and oriented to person, place, and time.  Psychiatric:        Mood and Affect: Mood normal.        Behavior: Behavior normal.    Breast Exam Chaperone: Thana Ates     LABORATORY DATA:  I have reviewed the data as listed CMP Latest Ref Rng & Units 06/25/2021 06/11/2021 05/28/2021  Glucose 70 - 99 mg/dL 110(H) 106(H) 113(H)  BUN 8 - 23 mg/dL _0 Creatinine 0.44 - 1.00 mg/dL 0.94 0.65 0.71  Sodium 135 - 145 mmol/L 133(L) 134(L) 127(L)  Potassium 3.5 - 5.1 mmol/L 4.2 3.4(L) 3.9  Chloride 98 - 111 mmol/L 95(L) 98 95(L)  CO2 22 - 32 mmol/L _1 Calcium 8.9 - 10.3 mg/dL 8.8(L) 8.4(L) 9.5  Total Protein 6.5 - 8.1 g/dL 8.6(H) 8.3(H) 8.0  Total Bilirubin 0.3 - 1.2 mg/dL 0.9 0.8 0.8  Alkaline Phos 38 - 126 U/L 41 46 42  AST 15 - 41 U/L _2 ALT 0 - 44 U/L _3 No results found for: DPO242 Lab Results  Component Value Date   WBC 2.8 (L) 06/25/2021   HGB 9.6 (L) 06/25/2021   HCT 33.4 (L) 06/25/2021   MCV 107.4 (H) 06/25/2021   PLT 60 (L) 06/25/2021   NEUTROABS 0.7 (L) 06/25/2021    ASSESSMENT:  1.    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 Nevada Crane for pancytopenia. Subsequently her white count has recovered along with recovery of the hemoglobin. -Bone marrow biopsy on 11/06/2020 shows hypercellular marrow with trilineage dyspoiesis and polytypic plasmacytosis.  Primary differential includes CMML and other MDS.  Definitive blast population  is not identified morphologically or by flow.  There is a increased immature mononuclear cells favored to be monocytic in origin.  Megakaryocytes are frequently hypolobated and hyperchromatic without clustering.  Atypical appearing erythroid precursors.  Increased plasma cells 5 to 9% which are polytypic by kappa and lambda staining. - Chromosome analysis shows 46,X, idiocentric X chromosome[9]/46, XX[11]. -MDS FISH panel was negative. -Revised IPSS score of 3.  In the absence of treatment, median survival of 5.3 years and 25% AML progression is 10.8  years. -SPEP, methylmalonic acid, ferritin levels were normal. - Serum copper level is 87.   2.  Stage I left breast cancer: -Diagnosed in Sep 1999, treated with lumpectomy and XRT. ER/PR was positive and received tamoxifen for 5 years. -Mammogram on 2019-10-30 was BI-RADS Category 2. Physical exam did not reveal any palpable masses today.   3.  Stage I adenocarcinoma of the left upper lobe of the lung: -Left upper lobectomy on 2005-06-01. CT chest on 2018-06-09 was stable. - CT CAP on 10/24/2020 shows stable postsurgical changes from the partial left upper lobe resection.  No other signs of malignancy.   PLAN:  1.   Low risk MDS/CMML: - She was started on Aranesp on 05/28/2021, last dose on 06/11/2021. - Today hemoglobin is 9.6, improved from 8.8 on 06/11/2021. - Will likely change Aranesp to every 4 weeks. - RTC 3 months for follow-up.  We will repeat ferritin, iron panel, T01 and folic acid at next visit.   2.  Peripheral neuropathy: - She will continue gabapentin.   3.  Hyponatremia: - Has hyponatremia thought to be secondary to SIADH which is slowly improving.  Current sodium level is 133.  Breast Cancer therapy associated bone loss: I have recommended calcium, Vitamin D and weight bearing exercises.  Orders placed this encounter:  No orders of the defined types were placed in this encounter.   The patient has a good understanding of  the overall plan. She agrees with it. She will call with any problems that may develop before the next visit here.  Derek Jack, MD [facility] [phone]   I, Thana Ates, am acting as a scribe for Dr. Derek Jack.  I, Derek Jack MD, have reviewed the above documentation for accuracy and completeness, and I agree with the above.

## 2021-06-25 ENCOUNTER — Other Ambulatory Visit: Payer: Self-pay

## 2021-06-25 ENCOUNTER — Inpatient Hospital Stay (HOSPITAL_COMMUNITY): Payer: Medicare Other

## 2021-06-25 ENCOUNTER — Inpatient Hospital Stay (HOSPITAL_BASED_OUTPATIENT_CLINIC_OR_DEPARTMENT_OTHER): Payer: Medicare Other | Admitting: Hematology

## 2021-06-25 VITALS — BP 116/52 | HR 70 | Temp 97.7°F | Resp 18 | Wt 136.0 lb

## 2021-06-25 DIAGNOSIS — D539 Nutritional anemia, unspecified: Secondary | ICD-10-CM

## 2021-06-25 DIAGNOSIS — E46 Unspecified protein-calorie malnutrition: Secondary | ICD-10-CM | POA: Diagnosis not present

## 2021-06-25 DIAGNOSIS — C50912 Malignant neoplasm of unspecified site of left female breast: Secondary | ICD-10-CM

## 2021-06-25 DIAGNOSIS — M6281 Muscle weakness (generalized): Secondary | ICD-10-CM | POA: Diagnosis not present

## 2021-06-25 DIAGNOSIS — D469 Myelodysplastic syndrome, unspecified: Secondary | ICD-10-CM

## 2021-06-25 DIAGNOSIS — D518 Other vitamin B12 deficiency anemias: Secondary | ICD-10-CM

## 2021-06-25 DIAGNOSIS — K5900 Constipation, unspecified: Secondary | ICD-10-CM | POA: Diagnosis not present

## 2021-06-25 DIAGNOSIS — R5383 Other fatigue: Secondary | ICD-10-CM | POA: Diagnosis not present

## 2021-06-25 DIAGNOSIS — R059 Cough, unspecified: Secondary | ICD-10-CM | POA: Diagnosis not present

## 2021-06-25 LAB — COMPREHENSIVE METABOLIC PANEL
ALT: 15 U/L (ref 0–44)
AST: 23 U/L (ref 15–41)
Albumin: 2.9 g/dL — ABNORMAL LOW (ref 3.5–5.0)
Alkaline Phosphatase: 41 U/L (ref 38–126)
Anion gap: 9 (ref 5–15)
BUN: 18 mg/dL (ref 8–23)
CO2: 29 mmol/L (ref 22–32)
Calcium: 8.8 mg/dL — ABNORMAL LOW (ref 8.9–10.3)
Chloride: 95 mmol/L — ABNORMAL LOW (ref 98–111)
Creatinine, Ser: 0.94 mg/dL (ref 0.44–1.00)
GFR, Estimated: 59 mL/min — ABNORMAL LOW (ref >60)
Glucose, Bld: 110 mg/dL — ABNORMAL HIGH (ref 70–99)
Potassium: 4.2 mmol/L (ref 3.5–5.1)
Sodium: 133 mmol/L — ABNORMAL LOW (ref 135–145)
Total Bilirubin: 0.9 mg/dL (ref 0.3–1.2)
Total Protein: 8.6 g/dL — ABNORMAL HIGH (ref 6.5–8.1)

## 2021-06-25 LAB — CBC WITH DIFFERENTIAL/PLATELET
Abs Immature Granulocytes: 0.05 10*3/uL (ref 0.00–0.07)
Basophils Absolute: 0 10*3/uL (ref 0.0–0.1)
Basophils Relative: 0 %
Eosinophils Absolute: 0 10*3/uL (ref 0.0–0.5)
Eosinophils Relative: 0 %
HCT: 33.4 % — ABNORMAL LOW (ref 36.0–46.0)
Hemoglobin: 9.6 g/dL — ABNORMAL LOW (ref 12.0–15.0)
Immature Granulocytes: 2 %
Lymphocytes Relative: 42 %
Lymphs Abs: 1.2 10*3/uL (ref 0.7–4.0)
MCH: 30.9 pg (ref 26.0–34.0)
MCHC: 28.7 g/dL — ABNORMAL LOW (ref 30.0–36.0)
MCV: 107.4 fL — ABNORMAL HIGH (ref 80.0–100.0)
Monocytes Absolute: 0.9 10*3/uL (ref 0.1–1.0)
Monocytes Relative: 32 %
Neutro Abs: 0.7 10*3/uL — ABNORMAL LOW (ref 1.7–7.7)
Neutrophils Relative %: 24 %
Platelets: 60 10*3/uL — ABNORMAL LOW (ref 150–400)
RBC: 3.11 MIL/uL — ABNORMAL LOW (ref 3.87–5.11)
RDW: 17.9 % — ABNORMAL HIGH (ref 11.5–15.5)
WBC: 2.8 10*3/uL — ABNORMAL LOW (ref 4.0–10.5)
nRBC: 4.4 % — ABNORMAL HIGH (ref 0.0–0.2)

## 2021-06-25 LAB — SAMPLE TO BLOOD BANK

## 2021-06-25 LAB — LACTATE DEHYDROGENASE: LDH: 221 U/L — ABNORMAL HIGH (ref 98–192)

## 2021-06-25 MED ORDER — DARBEPOETIN ALFA 300 MCG/0.6ML IJ SOSY
200.0000 ug | PREFILLED_SYRINGE | Freq: Once | INTRAMUSCULAR | Status: AC
  Administered 2021-06-25: 200 ug via SUBCUTANEOUS
  Filled 2021-06-25: qty 0.6

## 2021-06-25 NOTE — Patient Instructions (Signed)
[facility]  Discharge Instructions: Thank you for choosing [facility] to provide your oncology and hematology care.  If you have a lab appointment with the [facility], please come in thru the Main Entrance and check in at the main information desk.  Wear comfortable clothing and clothing appropriate for easy access to any Portacath or PICC line.   We strive to give you quality time with your provider. You may need to reschedule your appointment if you arrive late (15 or more minutes).  Arriving late affects you and other patients whose appointments are after yours.  Also, if you miss three or more appointments without notifying the office, you may be dismissed from the clinic at the provider's discretion.      For prescription refill requests, have your pharmacy contact our office and allow 72 hours for refills to be completed.    Today you received Aranesp injection.    BELOW ARE SYMPTOMS THAT SHOULD BE REPORTED IMMEDIATELY: *FEVER GREATER THAN 100.4 F (38 C) OR HIGHER *CHILLS OR SWEATING *NAUSEA AND VOMITING THAT IS NOT CONTROLLED WITH YOUR NAUSEA MEDICATION *UNUSUAL SHORTNESS OF BREATH *UNUSUAL BRUISING OR BLEEDING *URINARY PROBLEMS (pain or burning when urinating, or frequent urination) *BOWEL PROBLEMS (unusual diarrhea, constipation, pain near the anus) TENDERNESS IN MOUTH AND THROAT WITH OR WITHOUT PRESENCE OF ULCERS (sore throat, sores in mouth, or a toothache) UNUSUAL RASH, SWELLING OR PAIN  UNUSUAL VAGINAL DISCHARGE OR ITCHING   Items with * indicate a potential emergency and should be followed up as soon as possible or go to the Emergency Department if any problems should occur.  Please show the CHEMOTHERAPY ALERT CARD or IMMUNOTHERAPY ALERT CARD at check-in to the Emergency Department and triage nurse.  Should you have questions after your visit or need to cancel or reschedule your appointment, please contact [facility]  [phone]  and follow the prompts.  Office hours are 8:00 a.m. to 4:30 p.m. Monday - Friday. Please note that voicemails left after 4:00 p.m. may not be returned until the following business day.  We are closed weekends and major holidays. You have access to a nurse at all times for urgent questions. Please call the main number to the clinic [phone] and follow the prompts.  For any non-urgent questions, you may also contact your provider using MyChart. We now offer e-Visits for anyone 29 and older to request care online for non-urgent symptoms. For details visit [email or web address].   Also download the MyChart app! Go to the app store, search "MyChart", open the app, select [facility], and log in with your MyChart username and password.  Due to Covid, a mask is required upon entering the hospital/clinic. If you do not have a mask, one will be given to you upon arrival. For doctor visits, patients may have 1 support person aged 30 or older with them. For treatment visits, patients cannot have anyone with them due to current Covid guidelines and our immunocompromised population.

## 2021-06-25 NOTE — Progress Notes (Signed)
Lisa [name] presents today for injection per the provider's orders.  Aranesp administration without incident; injection site WNL; see [name] for injection details.  Patient tolerated procedure well and without incident.  No questions or complaints noted at this time.  Per Dr. Larwance Sachs Aranesp will be given weekly.  Aransep given today per MD orders. Tolerated infusion without adverse affects. Vital signs stable. No complaints at this time. Discharged from clinic via wheelchair in stable condition. Alert and oriented x 3. F/U with [facility] as scheduled.

## 2021-06-25 NOTE — Patient Instructions (Addendum)
[facility] at [facility] Discharge Instructions  You were seen today by Dr. Katragadda. He went over your recent results. Dr. Katragadda will see you back in 3 months for labs and follow up.   Thank you for choosing [facility] at [facility] to provide your oncology and hematology care.  To afford each patient quality time with our provider, please arrive at least 15 minutes before your scheduled appointment time.   If you have a lab appointment with the Cancer Center please come in thru the Main Entrance and check in at the main information desk  You need to re-schedule your appointment should you arrive 10 or more minutes late.  We strive to give you quality time with our providers, and arriving late affects you and other patients whose appointments are after yours.  Also, if you no show three or more times for appointments you may be dismissed from the clinic at the providers discretion.     Again, thank you for choosing [facility].  Our hope is that these requests will decrease the amount of time that you wait before being seen by our physicians.       _____________________________________________________________  Should you have questions after your visit to [facility], please contact our office at [phone] between the hours of 8:00 a.m. and 4:30 p.m.  Voicemails left after 4:00 p.m. will not be returned until the following business day.  For prescription refill requests, have your pharmacy contact our office and allow 72 hours.    Cancer Center Support Programs:   > Cancer Support Group  2nd Tuesday of the month 1pm-2pm, Journey Room   

## 2021-07-05 DIAGNOSIS — Z20822 Contact with and (suspected) exposure to covid-19: Secondary | ICD-10-CM | POA: Diagnosis not present

## 2021-07-15 ENCOUNTER — Other Ambulatory Visit (HOSPITAL_COMMUNITY): Payer: Self-pay | Admitting: Hematology and Oncology

## 2021-07-16 ENCOUNTER — Encounter (HOSPITAL_COMMUNITY): Payer: Self-pay | Admitting: Hematology and Oncology

## 2021-07-23 ENCOUNTER — Ambulatory Visit (HOSPITAL_COMMUNITY): Payer: TRICARE For Life (TFL)

## 2021-07-23 ENCOUNTER — Other Ambulatory Visit (HOSPITAL_COMMUNITY): Payer: TRICARE For Life (TFL)

## 2021-07-25 ENCOUNTER — Inpatient Hospital Stay (HOSPITAL_COMMUNITY): Payer: Medicare Other

## 2021-07-25 ENCOUNTER — Inpatient Hospital Stay (HOSPITAL_COMMUNITY): Payer: Medicare Other | Attending: Hematology

## 2021-07-25 ENCOUNTER — Other Ambulatory Visit: Payer: Self-pay

## 2021-07-25 DIAGNOSIS — D469 Myelodysplastic syndrome, unspecified: Secondary | ICD-10-CM | POA: Diagnosis not present

## 2021-07-25 DIAGNOSIS — Z79899 Other long term (current) drug therapy: Secondary | ICD-10-CM | POA: Diagnosis not present

## 2021-07-25 DIAGNOSIS — E46 Unspecified protein-calorie malnutrition: Secondary | ICD-10-CM | POA: Diagnosis not present

## 2021-07-25 DIAGNOSIS — M6281 Muscle weakness (generalized): Secondary | ICD-10-CM | POA: Diagnosis not present

## 2021-07-25 DIAGNOSIS — D4620 Refractory anemia with excess of blasts, unspecified: Secondary | ICD-10-CM | POA: Insufficient documentation

## 2021-07-25 DIAGNOSIS — D539 Nutritional anemia, unspecified: Secondary | ICD-10-CM

## 2021-07-25 LAB — CBC WITH DIFFERENTIAL/PLATELET
Band Neutrophils: 2 %
Basophils Absolute: 0 10*3/uL (ref 0.0–0.1)
Basophils Relative: 0 %
Eosinophils Absolute: 0 10*3/uL (ref 0.0–0.5)
Eosinophils Relative: 0 %
HCT: 33.9 % — ABNORMAL LOW (ref 36.0–46.0)
Hemoglobin: 9.3 g/dL — ABNORMAL LOW (ref 12.0–15.0)
Lymphocytes Relative: 58 %
Lymphs Abs: 1.9 10*3/uL (ref 0.7–4.0)
MCH: 29.5 pg (ref 26.0–34.0)
MCHC: 27.4 g/dL — ABNORMAL LOW (ref 30.0–36.0)
MCV: 107.6 fL — ABNORMAL HIGH (ref 80.0–100.0)
Metamyelocytes Relative: 2 %
Monocytes Absolute: 1 10*3/uL (ref 0.1–1.0)
Monocytes Relative: 32 %
Neutro Abs: 0.3 10*3/uL — CL (ref 1.7–7.7)
Neutrophils Relative %: 6 %
Platelets: 78 10*3/uL — ABNORMAL LOW (ref 150–400)
RBC: 3.15 MIL/uL — ABNORMAL LOW (ref 3.87–5.11)
RDW: 17.6 % — ABNORMAL HIGH (ref 11.5–15.5)
WBC: 3.2 10*3/uL — ABNORMAL LOW (ref 4.0–10.5)
nRBC: 5.6 % — ABNORMAL HIGH (ref 0.0–0.2)

## 2021-07-25 LAB — SAMPLE TO BLOOD BANK

## 2021-07-25 NOTE — Progress Notes (Signed)
CRITICAL VALUE ALERT Critical value received:  ANC 0.3 Date of notification:  07/25/21 Time of notification: 09:18 am Critical value read back:  Yes.   Nurse who received alert:  Bpresnell RN/ Received alert from Mancel Bale lab tech MD notified time and response:  R[place]nington PA.

## 2021-07-25 NOTE — Progress Notes (Signed)
Lab called for [place] of 0.3, Tarri Abernethy PA notified by Benjiman Core, RN. Patient left Cancer Center due to an personal need before entering back into treatment area. PA notified and advised for patient to be rescheduled as soon as possible and to educate patient of neutropenic precautions. Patient's appointment rescheduled.

## 2021-07-30 ENCOUNTER — Encounter (HOSPITAL_COMMUNITY): Payer: Self-pay

## 2021-07-30 ENCOUNTER — Inpatient Hospital Stay (HOSPITAL_COMMUNITY): Payer: Medicare Other

## 2021-07-30 ENCOUNTER — Other Ambulatory Visit (HOSPITAL_COMMUNITY): Payer: Self-pay | Admitting: Hematology and Oncology

## 2021-07-30 ENCOUNTER — Other Ambulatory Visit: Payer: Self-pay

## 2021-07-30 VITALS — BP 102/50 | HR 64 | Temp 97.7°F | Resp 16

## 2021-07-30 DIAGNOSIS — D539 Nutritional anemia, unspecified: Secondary | ICD-10-CM

## 2021-07-30 DIAGNOSIS — M6281 Muscle weakness (generalized): Secondary | ICD-10-CM | POA: Diagnosis not present

## 2021-07-30 DIAGNOSIS — Z79899 Other long term (current) drug therapy: Secondary | ICD-10-CM | POA: Diagnosis not present

## 2021-07-30 DIAGNOSIS — D469 Myelodysplastic syndrome, unspecified: Secondary | ICD-10-CM

## 2021-07-30 DIAGNOSIS — D4620 Refractory anemia with excess of blasts, unspecified: Secondary | ICD-10-CM | POA: Diagnosis not present

## 2021-07-30 DIAGNOSIS — E46 Unspecified protein-calorie malnutrition: Secondary | ICD-10-CM | POA: Diagnosis not present

## 2021-07-30 MED ORDER — DARBEPOETIN ALFA 300 MCG/0.6ML IJ SOSY: 200.0000 ug | PREFILLED_SYRINGE | Freq: Once | INTRAMUSCULAR | Status: DC

## 2021-07-30 MED ORDER — DARBEPOETIN ALFA 200 MCG/0.4ML IJ SOSY
200.0000 ug | PREFILLED_SYRINGE | Freq: Once | INTRAMUSCULAR | Status: AC
  Administered 2021-07-30: 200 ug via SUBCUTANEOUS
  Filled 2021-07-30: qty 0.4

## 2021-07-30 NOTE — Progress Notes (Signed)
Patient arrived today for Aranesp injection. Patient educated on Neutropenic precautions and patient verbalized understanding. Patient presents today for Aranesp injection. Hemoglobin reviewed prior to administration. VSS. Injection tolerated without incident or complaint. Patient stable during and after injection. See [name] for details. Patient discharged in satisfactory condition with no s/s of distress noted.

## 2021-07-30 NOTE — Patient Instructions (Signed)
[facility]  Discharge Instructions: Thank you for choosing [facility] to provide your oncology and hematology care.  If you have a lab appointment with the [facility], please come in thru the Main Entrance and check in at the main information desk.  Wear comfortable clothing and clothing appropriate for easy access to any Portacath or PICC line.   We strive to give you quality time with your provider. You may need to reschedule your appointment if you arrive late (15 or more minutes).  Arriving late affects you and other patients whose appointments are after yours.  Also, if you miss three or more appointments without notifying the office, you may be dismissed from the clinic at the provider's discretion.      For prescription refill requests, have your pharmacy contact our office and allow 72 hours for refills to be completed.    Today you received the following chemotherapy and/or immunotherapy agents: Aranesp injection return as scheduled.   To help prevent nausea and vomiting after your treatment, we encourage you to take your nausea medication as directed.  BELOW ARE SYMPTOMS THAT SHOULD BE REPORTED IMMEDIATELY: *FEVER GREATER THAN 100.4 F (38 C) OR HIGHER *CHILLS OR SWEATING *NAUSEA AND VOMITING THAT IS NOT CONTROLLED WITH YOUR NAUSEA MEDICATION *UNUSUAL SHORTNESS OF BREATH *UNUSUAL BRUISING OR BLEEDING *URINARY PROBLEMS (pain or burning when urinating, or frequent urination) *BOWEL PROBLEMS (unusual diarrhea, constipation, pain near the anus) TENDERNESS IN MOUTH AND THROAT WITH OR WITHOUT PRESENCE OF ULCERS (sore throat, sores in mouth, or a toothache) UNUSUAL RASH, SWELLING OR PAIN  UNUSUAL VAGINAL DISCHARGE OR ITCHING   Items with * indicate a potential emergency and should be followed up as soon as possible or go to the Emergency Department if any problems should occur.  Please show the CHEMOTHERAPY ALERT CARD or IMMUNOTHERAPY ALERT CARD at  check-in to the Emergency Department and triage nurse.  Should you have questions after your visit or need to cancel or reschedule your appointment, please contact [facility] [phone]  and follow the prompts.  Office hours are 8:00 a.m. to 4:30 p.m. Monday - Friday. Please note that voicemails left after 4:00 p.m. may not be returned until the following business day.  We are closed weekends and major holidays. You have access to a nurse at all times for urgent questions. Please call the main number to the clinic [phone] and follow the prompts.  For any non-urgent questions, you may also contact your provider using MyChart. We now offer e-Visits for anyone 58 and older to request care online for non-urgent symptoms. For details visit [email or web address].   Also download the MyChart app! Go to the app store, search "MyChart", open the app, select [facility], and log in with your MyChart username and password.  Due to Covid, a mask is required upon entering the hospital/clinic. If you do not have a mask, one will be given to you upon arrival. For doctor visits, patients may have 1 support person aged 39 or older with them. For treatment visits, patients cannot have anyone with them due to current Covid guidelines and our immunocompromised population.

## 2021-08-14 DIAGNOSIS — R300 Dysuria: Secondary | ICD-10-CM | POA: Diagnosis not present

## 2021-08-14 DIAGNOSIS — R351 Nocturia: Secondary | ICD-10-CM | POA: Diagnosis not present

## 2021-08-14 DIAGNOSIS — N390 Urinary tract infection, site not specified: Secondary | ICD-10-CM | POA: Diagnosis not present

## 2021-08-20 ENCOUNTER — Other Ambulatory Visit (HOSPITAL_COMMUNITY): Payer: TRICARE For Life (TFL)

## 2021-08-20 ENCOUNTER — Ambulatory Visit (HOSPITAL_COMMUNITY): Payer: TRICARE For Life (TFL)

## 2021-08-22 ENCOUNTER — Inpatient Hospital Stay (HOSPITAL_COMMUNITY): Payer: Medicare Other | Attending: Hematology

## 2021-08-22 ENCOUNTER — Encounter (HOSPITAL_COMMUNITY): Payer: Self-pay

## 2021-08-22 ENCOUNTER — Other Ambulatory Visit: Payer: Self-pay

## 2021-08-22 ENCOUNTER — Inpatient Hospital Stay (HOSPITAL_COMMUNITY): Payer: Medicare Other

## 2021-08-22 VITALS — BP 118/59 | HR 100 | Temp 97.2°F | Resp 18

## 2021-08-22 DIAGNOSIS — K5900 Constipation, unspecified: Secondary | ICD-10-CM | POA: Diagnosis not present

## 2021-08-22 DIAGNOSIS — E871 Hypo-osmolality and hyponatremia: Secondary | ICD-10-CM | POA: Insufficient documentation

## 2021-08-22 DIAGNOSIS — R059 Cough, unspecified: Secondary | ICD-10-CM | POA: Diagnosis not present

## 2021-08-22 DIAGNOSIS — D4620 Refractory anemia with excess of blasts, unspecified: Secondary | ICD-10-CM | POA: Insufficient documentation

## 2021-08-22 DIAGNOSIS — Z79899 Other long term (current) drug therapy: Secondary | ICD-10-CM | POA: Diagnosis not present

## 2021-08-22 DIAGNOSIS — R200 Anesthesia of skin: Secondary | ICD-10-CM | POA: Diagnosis not present

## 2021-08-22 DIAGNOSIS — D61818 Other pancytopenia: Secondary | ICD-10-CM

## 2021-08-22 DIAGNOSIS — D469 Myelodysplastic syndrome, unspecified: Secondary | ICD-10-CM

## 2021-08-22 DIAGNOSIS — C50912 Malignant neoplasm of unspecified site of left female breast: Secondary | ICD-10-CM

## 2021-08-22 DIAGNOSIS — R5383 Other fatigue: Secondary | ICD-10-CM | POA: Diagnosis not present

## 2021-08-22 DIAGNOSIS — D518 Other vitamin B12 deficiency anemias: Secondary | ICD-10-CM

## 2021-08-22 DIAGNOSIS — Z85118 Personal history of other malignant neoplasm of bronchus and lung: Secondary | ICD-10-CM | POA: Insufficient documentation

## 2021-08-22 DIAGNOSIS — G629 Polyneuropathy, unspecified: Secondary | ICD-10-CM | POA: Diagnosis not present

## 2021-08-22 DIAGNOSIS — R39198 Other difficulties with micturition: Secondary | ICD-10-CM | POA: Insufficient documentation

## 2021-08-22 DIAGNOSIS — Z853 Personal history of malignant neoplasm of breast: Secondary | ICD-10-CM | POA: Diagnosis not present

## 2021-08-22 DIAGNOSIS — D539 Nutritional anemia, unspecified: Secondary | ICD-10-CM

## 2021-08-22 LAB — CBC WITH DIFFERENTIAL/PLATELET
Abs Immature Granulocytes: 0.2 10*3/uL — ABNORMAL HIGH (ref 0.00–0.07)
Basophils Absolute: 0 10*3/uL (ref 0.0–0.1)
Basophils Relative: 0 %
Eosinophils Absolute: 0 10*3/uL (ref 0.0–0.5)
Eosinophils Relative: 0 %
HCT: 33.2 % — ABNORMAL LOW (ref 36.0–46.0)
Hemoglobin: 9.4 g/dL — ABNORMAL LOW (ref 12.0–15.0)
Immature Granulocytes: 7 %
Lymphocytes Relative: 57 %
Lymphs Abs: 1.5 10*3/uL (ref 0.7–4.0)
MCH: 29.5 pg (ref 26.0–34.0)
MCHC: 28.3 g/dL — ABNORMAL LOW (ref 30.0–36.0)
MCV: 104.1 fL — ABNORMAL HIGH (ref 80.0–100.0)
Monocytes Absolute: 0.7 10*3/uL (ref 0.1–1.0)
Monocytes Relative: 27 %
Neutro Abs: 0.3 10*3/uL — CL (ref 1.7–7.7)
Neutrophils Relative %: 9 %
Platelets: 45 10*3/uL — ABNORMAL LOW (ref 150–400)
RBC: 3.19 MIL/uL — ABNORMAL LOW (ref 3.87–5.11)
RDW: 17.7 % — ABNORMAL HIGH (ref 11.5–15.5)
WBC: 2.8 10*3/uL — ABNORMAL LOW (ref 4.0–10.5)
nRBC: 6.4 % — ABNORMAL HIGH (ref 0.0–0.2)

## 2021-08-22 LAB — COMPREHENSIVE METABOLIC PANEL
ALT: 15 U/L (ref 0–44)
AST: 29 U/L (ref 15–41)
Albumin: 2.8 g/dL — ABNORMAL LOW (ref 3.5–5.0)
Alkaline Phosphatase: 57 U/L (ref 38–126)
Anion gap: 6 (ref 5–15)
BUN: 12 mg/dL (ref 8–23)
CO2: 26 mmol/L (ref 22–32)
Calcium: 8.6 mg/dL — ABNORMAL LOW (ref 8.9–10.3)
Chloride: 102 mmol/L (ref 98–111)
Creatinine, Ser: 0.65 mg/dL (ref 0.44–1.00)
GFR, Estimated: >60 mL/min (ref >60)
Glucose, Bld: 90 mg/dL (ref 70–99)
Potassium: 4.3 mmol/L (ref 3.5–5.1)
Sodium: 134 mmol/L — ABNORMAL LOW (ref 135–145)
Total Bilirubin: 0.6 mg/dL (ref 0.3–1.2)
Total Protein: 8.9 g/dL — ABNORMAL HIGH (ref 6.5–8.1)

## 2021-08-22 LAB — FERRITIN: Ferritin: 591 ng/mL — ABNORMAL HIGH (ref 11–307)

## 2021-08-22 LAB — IRON AND TIBC
Iron: 69 ug/dL (ref 28–170)
Saturation Ratios: 32 % — ABNORMAL HIGH (ref 10.4–31.8)
TIBC: 215 ug/dL — ABNORMAL LOW (ref 250–450)
UIBC: 146 ug/dL

## 2021-08-22 LAB — VITAMIN B12: Vitamin B-12: 586 pg/mL (ref 180–914)

## 2021-08-22 LAB — LACTATE DEHYDROGENASE: LDH: 263 U/L — ABNORMAL HIGH (ref 98–192)

## 2021-08-22 LAB — FOLATE: Folate: 32.3 ng/mL (ref >5.9)

## 2021-08-22 LAB — SAMPLE TO BLOOD BANK

## 2021-08-22 LAB — VITAMIN D 25 HYDROXY (VIT D DEFICIENCY, FRACTURES): Vit D, 25-Hydroxy: 114.24 ng/mL — ABNORMAL HIGH (ref 30–100)

## 2021-08-22 MED ORDER — DARBEPOETIN ALFA 200 MCG/0.4ML IJ SOSY
200.0000 ug | PREFILLED_SYRINGE | Freq: Once | INTRAMUSCULAR | Status: AC
  Administered 2021-08-22: 200 ug via SUBCUTANEOUS
  Filled 2021-08-22: qty 0.4

## 2021-08-22 NOTE — Progress Notes (Signed)
Patient okay to received injection per Dr. Delton Coombes. Patient tolerated injection with no complaints voiced. Site clean and dry with no bruising or swelling noted at site. See [name] for details. Band aid applied.  Patient stable during and after injection. VSS with discharge and left in satisfactory condition with no s/s of distress noted.

## 2021-08-22 NOTE — Patient Instructions (Signed)
[facility]  Discharge Instructions: Thank you for choosing [facility] to provide your oncology and hematology care.  If you have a lab appointment with the [facility], please come in thru the Main Entrance and check in at the main information desk.  Wear comfortable clothing and clothing appropriate for easy access to any Portacath or PICC line.   We strive to give you quality time with your provider. You may need to reschedule your appointment if you arrive late (15 or more minutes).  Arriving late affects you and other patients whose appointments are after yours.  Also, if you miss three or more appointments without notifying the office, you may be dismissed from the clinic at the provider's discretion.      For prescription refill requests, have your pharmacy contact our office and allow 72 hours for refills to be completed.    Today you received the following Aranesp, return as scheduled.   To help prevent nausea and vomiting after your treatment, we encourage you to take your nausea medication as directed.  BELOW ARE SYMPTOMS THAT SHOULD BE REPORTED IMMEDIATELY: *FEVER GREATER THAN 100.4 F (38 C) OR HIGHER *CHILLS OR SWEATING *NAUSEA AND VOMITING THAT IS NOT CONTROLLED WITH YOUR NAUSEA MEDICATION *UNUSUAL SHORTNESS OF BREATH *UNUSUAL BRUISING OR BLEEDING *URINARY PROBLEMS (pain or burning when urinating, or frequent urination) *BOWEL PROBLEMS (unusual diarrhea, constipation, pain near the anus) TENDERNESS IN MOUTH AND THROAT WITH OR WITHOUT PRESENCE OF ULCERS (sore throat, sores in mouth, or a toothache) UNUSUAL RASH, SWELLING OR PAIN  UNUSUAL VAGINAL DISCHARGE OR ITCHING   Items with * indicate a potential emergency and should be followed up as soon as possible or go to the Emergency Department if any problems should occur.  Please show the CHEMOTHERAPY ALERT CARD or IMMUNOTHERAPY ALERT CARD at check-in to the Emergency Department and triage  nurse.  Should you have questions after your visit or need to cancel or reschedule your appointment, please contact [facility] [phone]  and follow the prompts.  Office hours are 8:00 a.m. to 4:30 p.m. Monday - Friday. Please note that voicemails left after 4:00 p.m. may not be returned until the following business day.  We are closed weekends and major holidays. You have access to a nurse at all times for urgent questions. Please call the main number to the clinic [phone] and follow the prompts.  For any non-urgent questions, you may also contact your provider using MyChart. We now offer e-Visits for anyone 79 and older to request care online for non-urgent symptoms. For details visit [email or web address].   Also download the MyChart app! Go to the app store, search "MyChart", open the app, select [facility], and log in with your MyChart username and password.  Due to Covid, a mask is required upon entering the hospital/clinic. If you do not have a mask, one will be given to you upon arrival. For doctor visits, patients may have 1 support person aged 37 or older with them. For treatment visits, patients cannot have anyone with them due to current Covid guidelines and our immunocompromised population.

## 2021-08-22 NOTE — Progress Notes (Signed)
CRITICAL VALUE ALERT Critical value received:  ANC 0.3 Date of notification:  08/22/21 Time of notification: 10:45 am Critical value read back:  Yes.   Nurse who received alert:  B Chief Technology Officer . Alert received from [employer].  MD notified time and response:  Message sent to Dr. Delton Coombes and Billey Co PA by secure chat.

## 2021-08-26 ENCOUNTER — Encounter (HOSPITAL_COMMUNITY): Payer: Self-pay | Admitting: Emergency Medicine

## 2021-08-26 ENCOUNTER — Emergency Department (HOSPITAL_COMMUNITY): Payer: Medicare Other

## 2021-08-26 ENCOUNTER — Inpatient Hospital Stay (HOSPITAL_COMMUNITY)
Admission: EM | Admit: 2021-08-26 | Discharge: 2021-08-28 | DRG: 641 | Disposition: A | Discharge status: Home Health | Payer: Medicare Other | Attending: Family Medicine | Admitting: Family Medicine

## 2021-08-26 ENCOUNTER — Other Ambulatory Visit: Payer: Self-pay

## 2021-08-26 DIAGNOSIS — Z6821 Body mass index (BMI) 21.0-21.9, adult: Secondary | ICD-10-CM

## 2021-08-26 DIAGNOSIS — Z23 Encounter for immunization: Secondary | ICD-10-CM

## 2021-08-26 DIAGNOSIS — I10 Essential (primary) hypertension: Secondary | ICD-10-CM | POA: Diagnosis not present

## 2021-08-26 DIAGNOSIS — N1832 Chronic kidney disease, stage 3b: Secondary | ICD-10-CM | POA: Diagnosis present

## 2021-08-26 DIAGNOSIS — E039 Hypothyroidism, unspecified: Secondary | ICD-10-CM | POA: Diagnosis present

## 2021-08-26 DIAGNOSIS — R509 Fever, unspecified: Secondary | ICD-10-CM

## 2021-08-26 DIAGNOSIS — E876 Hypokalemia: Secondary | ICD-10-CM | POA: Diagnosis present

## 2021-08-26 DIAGNOSIS — N179 Acute kidney failure, unspecified: Secondary | ICD-10-CM | POA: Diagnosis present

## 2021-08-26 DIAGNOSIS — I517 Cardiomegaly: Secondary | ICD-10-CM | POA: Diagnosis not present

## 2021-08-26 DIAGNOSIS — E440 Moderate protein-calorie malnutrition: Secondary | ICD-10-CM | POA: Diagnosis present

## 2021-08-26 DIAGNOSIS — Z923 Personal history of irradiation: Secondary | ICD-10-CM

## 2021-08-26 DIAGNOSIS — M6281 Muscle weakness (generalized): Secondary | ICD-10-CM | POA: Diagnosis not present

## 2021-08-26 DIAGNOSIS — Z87891 Personal history of nicotine dependence: Secondary | ICD-10-CM

## 2021-08-26 DIAGNOSIS — I639 Cerebral infarction, unspecified: Secondary | ICD-10-CM | POA: Diagnosis not present

## 2021-08-26 DIAGNOSIS — Z20822 Contact with and (suspected) exposure to covid-19: Secondary | ICD-10-CM | POA: Diagnosis present

## 2021-08-26 DIAGNOSIS — R112 Nausea with vomiting, unspecified: Secondary | ICD-10-CM

## 2021-08-26 DIAGNOSIS — R519 Headache, unspecified: Secondary | ICD-10-CM | POA: Diagnosis not present

## 2021-08-26 DIAGNOSIS — R531 Weakness: Principal | ICD-10-CM

## 2021-08-26 DIAGNOSIS — R32 Unspecified urinary incontinence: Secondary | ICD-10-CM | POA: Diagnosis present

## 2021-08-26 DIAGNOSIS — Z853 Personal history of malignant neoplasm of breast: Secondary | ICD-10-CM

## 2021-08-26 DIAGNOSIS — R1084 Generalized abdominal pain: Secondary | ICD-10-CM | POA: Diagnosis not present

## 2021-08-26 DIAGNOSIS — R197 Diarrhea, unspecified: Secondary | ICD-10-CM | POA: Diagnosis not present

## 2021-08-26 DIAGNOSIS — I6782 Cerebral ischemia: Secondary | ICD-10-CM | POA: Diagnosis not present

## 2021-08-26 DIAGNOSIS — E860 Dehydration: Secondary | ICD-10-CM | POA: Diagnosis present

## 2021-08-26 DIAGNOSIS — Z9981 Dependence on supplemental oxygen: Secondary | ICD-10-CM

## 2021-08-26 DIAGNOSIS — D469 Myelodysplastic syndrome, unspecified: Secondary | ICD-10-CM

## 2021-08-26 DIAGNOSIS — R42 Dizziness and giddiness: Secondary | ICD-10-CM | POA: Diagnosis not present

## 2021-08-26 DIAGNOSIS — Z79899 Other long term (current) drug therapy: Secondary | ICD-10-CM

## 2021-08-26 DIAGNOSIS — I129 Hypertensive chronic kidney disease with stage 1 through stage 4 chronic kidney disease, or unspecified chronic kidney disease: Secondary | ICD-10-CM | POA: Diagnosis present

## 2021-08-26 DIAGNOSIS — R627 Adult failure to thrive: Secondary | ICD-10-CM | POA: Diagnosis present

## 2021-08-26 DIAGNOSIS — Z79890 Hormone replacement therapy (postmenopausal): Secondary | ICD-10-CM | POA: Diagnosis not present

## 2021-08-26 DIAGNOSIS — J449 Chronic obstructive pulmonary disease, unspecified: Secondary | ICD-10-CM | POA: Diagnosis not present

## 2021-08-26 DIAGNOSIS — R296 Repeated falls: Secondary | ICD-10-CM | POA: Diagnosis present

## 2021-08-26 DIAGNOSIS — R634 Abnormal weight loss: Secondary | ICD-10-CM | POA: Diagnosis present

## 2021-08-26 DIAGNOSIS — W19XXXA Unspecified fall, initial encounter: Secondary | ICD-10-CM | POA: Diagnosis not present

## 2021-08-26 DIAGNOSIS — I714 Abdominal aortic aneurysm, without rupture, unspecified: Secondary | ICD-10-CM | POA: Diagnosis present

## 2021-08-26 DIAGNOSIS — D61818 Other pancytopenia: Secondary | ICD-10-CM

## 2021-08-26 DIAGNOSIS — Z85118 Personal history of other malignant neoplasm of bronchus and lung: Secondary | ICD-10-CM | POA: Diagnosis not present

## 2021-08-26 DIAGNOSIS — Z902 Acquired absence of lung [part of]: Secondary | ICD-10-CM | POA: Diagnosis not present

## 2021-08-26 DIAGNOSIS — E871 Hypo-osmolality and hyponatremia: Principal | ICD-10-CM

## 2021-08-26 DIAGNOSIS — D649 Anemia, unspecified: Secondary | ICD-10-CM | POA: Diagnosis present

## 2021-08-26 DIAGNOSIS — C50919 Malignant neoplasm of unspecified site of unspecified female breast: Secondary | ICD-10-CM | POA: Diagnosis present

## 2021-08-26 DIAGNOSIS — E222 Syndrome of inappropriate secretion of antidiuretic hormone: Secondary | ICD-10-CM | POA: Diagnosis not present

## 2021-08-26 LAB — PROTIME-INR
INR: 1.2 (ref 0.8–1.2)
Prothrombin Time: 15.3 seconds — ABNORMAL HIGH (ref 11.4–15.2)

## 2021-08-26 LAB — URINALYSIS, ROUTINE W REFLEX MICROSCOPIC
Bilirubin Urine: NEGATIVE
Glucose, UA: NEGATIVE mg/dL
Hgb urine dipstick: NEGATIVE
Ketones, ur: NEGATIVE mg/dL
Leukocytes,Ua: NEGATIVE
Nitrite: NEGATIVE
Protein, ur: NEGATIVE mg/dL
Specific Gravity, Urine: 1.005 (ref 1.005–1.030)
pH: 7 (ref 5.0–8.0)

## 2021-08-26 LAB — COMPREHENSIVE METABOLIC PANEL
ALT: 14 U/L (ref 0–44)
AST: 33 U/L (ref 15–41)
Albumin: 2.9 g/dL — ABNORMAL LOW (ref 3.5–5.0)
Alkaline Phosphatase: 47 U/L (ref 38–126)
Anion gap: 9 (ref 5–15)
BUN: 9 mg/dL (ref 8–23)
CO2: 27 mmol/L (ref 22–32)
Calcium: 8.4 mg/dL — ABNORMAL LOW (ref 8.9–10.3)
Chloride: 83 mmol/L — ABNORMAL LOW (ref 98–111)
Creatinine, Ser: 0.74 mg/dL (ref 0.44–1.00)
GFR, Estimated: >60 mL/min (ref >60)
Glucose, Bld: 84 mg/dL (ref 70–99)
Potassium: 3.4 mmol/L — ABNORMAL LOW (ref 3.5–5.1)
Sodium: 119 mmol/L — CL (ref 135–145)
Total Bilirubin: 0.5 mg/dL (ref 0.3–1.2)
Total Protein: 8.8 g/dL — ABNORMAL HIGH (ref 6.5–8.1)

## 2021-08-26 LAB — CBC
HCT: 35 % — ABNORMAL LOW (ref 36.0–46.0)
Hemoglobin: 10.7 g/dL — ABNORMAL LOW (ref 12.0–15.0)
MCH: 28.8 pg (ref 26.0–34.0)
MCHC: 30.6 g/dL (ref 30.0–36.0)
MCV: 94.3 fL (ref 80.0–100.0)
Platelets: 84 10*3/uL — ABNORMAL LOW (ref 150–400)
RBC: 3.71 MIL/uL — ABNORMAL LOW (ref 3.87–5.11)
RDW: 17.2 % — ABNORMAL HIGH (ref 11.5–15.5)
WBC: 2.9 10*3/uL — ABNORMAL LOW (ref 4.0–10.5)
nRBC: 11.6 % — ABNORMAL HIGH (ref 0.0–0.2)

## 2021-08-26 LAB — RESP PANEL BY RT-PCR (FLU A&B, COVID) ARPGX2
Influenza A by PCR: NEGATIVE
Influenza B by PCR: NEGATIVE
SARS Coronavirus 2 by RT PCR: NEGATIVE

## 2021-08-26 LAB — LIPASE, BLOOD: Lipase: 45 U/L (ref 11–51)

## 2021-08-26 LAB — LACTIC ACID, PLASMA: Lactic Acid, Venous: 1.2 mmol/L (ref 0.5–1.9)

## 2021-08-26 LAB — CREATININE, URINE, RANDOM: Creatinine, Urine: 49.41 mg/dL

## 2021-08-26 LAB — SODIUM, URINE, RANDOM: Sodium, Ur: 19 mmol/L

## 2021-08-26 MED ORDER — ENOXAPARIN SODIUM 40 MG/0.4ML IJ SOSY
40.0000 mg | PREFILLED_SYRINGE | INTRAMUSCULAR | Status: DC
  Administered 2021-08-26 – 2021-08-27 (×2): 40 mg via SUBCUTANEOUS
  Filled 2021-08-26 (×2): qty 0.4

## 2021-08-26 MED ORDER — ONDANSETRON HCL 4 MG PO TABS: 4.0000 mg | ORAL_TABLET | Freq: Four times a day (QID) | ORAL | Status: DC | PRN

## 2021-08-26 MED ORDER — POTASSIUM CHLORIDE IN NACL 20-0.9 MEQ/L-% IV SOLN
INTRAVENOUS | Status: DC
  Filled 2021-08-26: qty 1000

## 2021-08-26 MED ORDER — SODIUM CHLORIDE 0.9 % IV BOLUS
1000.0000 mL | Freq: Once | INTRAVENOUS | Status: AC
Start: 2021-08-26 — End: 2021-08-26
  Administered 2021-08-26: 1000 mL via INTRAVENOUS

## 2021-08-26 MED ORDER — POTASSIUM CHLORIDE IN NACL 20-0.9 MEQ/L-% IV SOLN: INTRAVENOUS | Status: DC

## 2021-08-26 MED ORDER — ONDANSETRON HCL 4 MG/2ML IJ SOLN
4.0000 mg | Freq: Four times a day (QID) | INTRAMUSCULAR | Status: DC | PRN
  Administered 2021-08-26: 4 mg via INTRAVENOUS
  Filled 2021-08-26: qty 2

## 2021-08-26 MED ORDER — ONDANSETRON 4 MG PO TBDP: 4.0000 mg | ORAL_TABLET | Freq: Once | ORAL | Status: DC | PRN

## 2021-08-26 NOTE — H&P (Addendum)
History and Physical    Lisa [name] MVH:[id number] DOB: [birth year] DOA: 08/26/2021  Referring MD/NP/PA: Ileene Patrick PA  PCP: Celene Squibb, MD  Outpatient Specialists: [facility]  Patient coming from: home  Chief Complaint: hyponatremia, weakness   HPI: Lisa [name] is a 87 y.o. female with medical history significant of multiple medical issues including lung cancer, breast cancer, hypertension, neutropenia, MDS, presenting with hyponatremia and weakness.  Per report, patient has had about 3 falls today.  Patient has had worsening weakness over the past 1 to 2 days.  Patient noted to have been treated for UTI on October 13 with patient being started on Cipro.  Per the patient, she is at increased nausea and vomiting.  Per report, the family reports patient with overall decreased p.o. intake.  No no reported chest pain or shortness of breath.  No reported abdominal pain.  No reported nausea or diarrhea.  No reported head trauma associated with the fall. ED Course: Presented to the ER afebrile, hemodynamically stable.  Labs notable for sodium o urinalysis pending.  F 119, potassium 3.4, creatinine 0.74.  Count 2.9, hemoglobin 10.7.?  Right-sided weakness on evaluation in the ER by ER provider.  CT head as well as MRI of the brain negative for any acute changes.  Review of Systems: As per HPI otherwise 10 point review of systems negative.   Past Medical History:  Diagnosis Date   Adenocarcinoma of left lung ([facility]) 2006   Arthritis    Asthma    Cancer of breast, female ([place])    Cancer of lung ([facility])    Hypertension    Hypothyroidism    Invasive ductal carcinoma of left breast ([facility]) 1999   Neutropenia ([facility]) 06/09/2016   Personal history of radiation therapy     Past Surgical History:  Procedure Laterality Date   BIOPSY  10/23/2020   Procedure: BIOPSY;  Surgeon: Yetta Flock, MD;  Location: [facility];  Service: Gastroenterology;;   BREAST BIOPSY     left axillary node  dissection   BREAST LUMPECTOMY Left    COLONOSCOPY  03/08/2003   WUX:[id number] rectal polyps destroyed with the tip of the snare/Polyps at hepatic flexure, splenic flexure at 24 cm/Left-sided diverticula: unable to retrieve path   COLONOSCOPY  09/12/2008   ZDG:UYQIHK rectum and distal sigmoid diminutive polyps/scattered left sided diverticulum. hyperplastic   COLONOSCOPY N/A 03/06/2013   VQQ:VZDGLOV polyp-removed as described above; colonic diverticulosis. hyperplastic polyps. next TCS 03/2018   ESOPHAGOGASTRODUODENOSCOPY (EGD) WITH PROPOFOL N/A 10/23/2020   Procedure: ESOPHAGOGASTRODUODENOSCOPY (EGD) WITH PROPOFOL;  Surgeon: Yetta Flock, MD;  Location: [facility];  Service: Gastroenterology;  Laterality: N/A;   FOOT SURGERY     LUNG REMOVAL, PARTIAL     upper lobe     reports that she quit smoking about 29 years ago. Her smoking use included cigarettes. She has a 26.25 pack-year smoking history. She has never used smokeless tobacco. She reports that she does not drink alcohol and does not use drugs.  Allergies  Allergen Reactions   Meloxicam Other (See Comments)    Caused an injury to the kidneys, per nephrologist   Micardis Hct [Telmisartan-Hctz] Other (See Comments)    Caused an injury to the kidneys, per nephrologist    Family History  Problem Relation Age of Onset   Cancer Mother    Cancer Sister    Colon cancer Neg Hx      Prior to Admission medications   Medication Sig Start Date End Date Taking?  Authorizing Provider  acetaminophen (TYLENOL) 325 MG tablet Take 2 tablets (650 mg total) by mouth every 6 (six) hours. 11/11/20  Yes Angiulli, Lavon Paganini, PA-C  furosemide (LASIX) 20 MG tablet Take 20 mg by mouth daily.   Yes [provider]  Iron-FA-B Cmp-C-Biot-Probiotic (FUSION PLUS) CAPS Take 1 capsule by mouth once a week.   Yes [provider]  levothyroxine (SYNTHROID) 100 MCG tablet Take 100 mcg by mouth daily before breakfast.   Yes [provider]  Magnesium 400 MG TABS Take 400 mg by mouth daily.   Yes [provider]  Melatonin 5 MG CAPS Take 1 capsule by mouth daily as needed (sleep).   Yes [provider]  metoprolol succinate (TOPROL-XL) 50 MG 24 hr tablet Take 50 mg by mouth daily. 12/20/20  Yes [provider]  mirtazapine (REMERON) 7.5 MG tablet Take 1 tablet (7.5 mg total) by mouth at bedtime. 05/28/21  Yes Heath Lark, MD  OVER THE COUNTER MEDICATION Take 2 tablets by mouth daily as needed (nausea). Nausea medication   Yes [provider]  pantoprazole (PROTONIX) 40 MG tablet Take 1 tablet (40 mg total) by mouth daily. 11/11/20  Yes Angiulli, Lavon Paganini, PA-C  polyethylene glycol (MIRALAX / GLYCOLAX) 17 g packet Take 17 g by mouth daily as needed for moderate constipation. 10/30/20  Yes Hall, Carole N, DO  senna-docusate (SENOKOT-S) 8.6-50 MG tablet Take 1 tablet by mouth at bedtime. 11/11/20  Yes Angiulli, Lavon Paganini, PA-C  umeclidinium bromide (INCRUSE ELLIPTA) 62.5 MCG/INH AEPB Inhale into the lungs daily. 12/31/20  Yes [provider]  Vitamin D, Ergocalciferol, (DRISDOL) 1.25 MG (50000 UNIT) CAPS capsule Take 50,000 Units by mouth once a week. 01/10/21  Yes [provider]  albuterol (PROVENTIL,VENTOLIN) 90 MCG/ACT inhaler Inhale 2 puffs into the lungs every 6 (six) hours as needed for wheezing or shortness of breath. Patient not taking: No sig reported 11/11/20   Angiulli, Lavon Paganini, PA-C  ciprofloxacin (CIPRO) 500 MG tablet Take 500 mg by mouth 2 (two) times daily. Patient not taking: Reported on 08/26/2021 08/14/21   [provider]  loratadine (CLARITIN) 10 MG tablet Take 10 mg by mouth at bedtime as needed. 01/18/21   [provider]  predniSONE (DELTASONE) 10 MG tablet Take 1 tablet (10 mg total) by mouth daily with breakfast. Patient not taking: Reported on 08/26/2021 05/14/21   Heath Lark, MD  traMADol (ULTRAM) 50 MG tablet Take 1 tablet (50 mg  total) by mouth every 6 (six) hours as needed for moderate pain. Patient not taking: Reported on 08/26/2021 11/11/20   Cathlyn Parsons, PA-C    Physical Exam: Vitals:   08/26/21 1143 08/26/21 1700  BP: 114/66 137/75  Pulse: 96 74  Resp: 17 18  Temp: 97.7 F (36.5 C)   TempSrc: Oral   SpO2: 97% 96%  Weight: 66.2 kg   Height: 5\' 6"  (1.676 m)       Constitutional: NAD, calm, comfortable Vitals:   08/26/21 1143 08/26/21 1700  BP: 114/66 137/75  Pulse: 96 74  Resp: 17 18  Temp: 97.7 F (36.5 C)   TempSrc: Oral   SpO2: 97% 96%  Weight: 66.2 kg   Height: 5\' 6"  (1.676 m)   Gen: NAD, mildly underweight  Eyes: PERRL, lids and conjunctivae normal ENMT: Mucous membranes are moist. Posterior pharynx clear of any exudate or lesions.Normal dentition.  Neck: normal, supple, no masses, no thyromegaly Respiratory: clear to auscultation bilaterally, no wheezing, no  crackles. Normal respiratory effort. No accessory muscle use.  Cardiovascular: Regular rate and rhythm, no murmurs / rubs / gallops. No extremity edema. 2+ pedal pulses. No carotid bruits.  Abdomen: no tenderness, no masses palpated. No hepatosplenomegaly. Bowel sounds positive.  Musculoskeletal: + generalized weakness diffusely. No overt R sided weakness noted.   Skin: no rashes, lesions, ulcers. No induration. + mildly dry  Neurologic: CN 2-12 grossly intact. Sensation intact, DTR normal. Strength 5/5 in all 4.  Psychiatric: Normal judgment and insight. Alert and oriented x 3. Normal mood.   Labs on Admission: I have personally reviewed following labs and imaging studies  CBC: Recent Labs  Lab 08/22/21 1002 08/26/21 1252  WBC 2.8* 2.9*  NEUTROABS 0.3*  --   HGB 9.4* 10.7*  HCT 33.2* 35.0*  MCV 104.1* 94.3  PLT 45* 84*   Basic Metabolic Panel: Recent Labs  Lab 08/22/21 1002 08/26/21 1252  NA 134* 119*  K 4.3 3.4*  CL 102 83*  CO2 26 27  GLUCOSE 90 84  BUN 12 9  CREATININE 0.65 0.74  CALCIUM 8.6* 8.4*    GFR: Estimated Creatinine Clearance: 48.1 mL/min (by C-G formula based on SCr of 0.74 mg/dL). Liver Function Tests: Recent Labs  Lab 08/22/21 1002 08/26/21 1252  AST 29 33  ALT 15 14  ALKPHOS 57 47  BILITOT 0.6 0.5  PROT 8.9* 8.8*  ALBUMIN 2.8* 2.9*   Recent Labs  Lab 08/26/21 1252  LIPASE 45   No results for input(s): AMMONIA in the last 168 hours. Coagulation Profile: Recent Labs  Lab 08/26/21 1517  INR 1.2   Cardiac Enzymes: No results for input(s): CKTOTAL, CKMB, CKMBINDEX, TROPONINI in the last 168 hours. BNP (last 3 results) No results for input(s): PROBNP in the last 8760 hours. HbA1C: No results for input(s): HGBA1C in the last 72 hours. CBG: No results for input(s): GLUCAP in the last 168 hours. Lipid Profile: No results for input(s): CHOL, HDL, LDLCALC, TRIG, CHOLHDL, LDLDIRECT in the last 72 hours. Thyroid Function Tests: No results for input(s): TSH, T4TOTAL, FREET4, T3FREE, THYROIDAB in the last 72 hours. Anemia Panel: No results for input(s): VITAMINB12, FOLATE, FERRITIN, TIBC, IRON, RETICCTPCT in the last 72 hours. Urine analysis:    Component Value Date/Time   COLORURINE YELLOW 03/30/2021 2245   APPEARANCEUR HAZY (A) 03/30/2021 2245   LABSPEC 1.014 03/30/2021 2245   PHURINE 6.0 03/30/2021 2245   GLUCOSEU NEGATIVE 03/30/2021 2245   HGBUR NEGATIVE 03/30/2021 2245   BILIRUBINUR NEGATIVE 03/30/2021 2245   KETONESUR NEGATIVE 03/30/2021 2245   PROTEINUR 30 (A) 03/30/2021 2245   NITRITE NEGATIVE 03/30/2021 2245   LEUKOCYTESUR LARGE (A) 03/30/2021 2245   Sepsis Labs: @LABRCNTIP (procalcitonin:4,lacticidven:4) )No results found for this or any previous visit (from the past 240 hour(s)).   Radiological Exams on Admission: CT Head Wo Contrast  Result Date: 08/26/2021 CLINICAL DATA:  TIA, headache and dizziness after falling at home at 3 a.m. this morning EXAM: CT HEAD WITHOUT CONTRAST TECHNIQUE: Contiguous axial images were obtained from the base  of the skull through the vertex without intravenous contrast. COMPARISON:  CT head 03/30/2021 FINDINGS: Brain: There is no acute intracranial hemorrhage, extra-axial fluid collection, or acute infarct. There is mild parenchymal volume loss. The ventricles are not enlarged. Foci of hypodensity in the subcortical and periventricular white matter likely reflects sequela of chronic white matter microangiopathy. There is no mass lesion.  There is no midline shift. Vascular: There is calcification of the bilateral cavernous ICAs. Skull: Normal. Negative  for fracture or focal lesion. Sinuses/Orbits: The imaged paranasal sinuses are clear. Bilateral lens implants are in place. The globes and orbits are otherwise unremarkable. Other: None. IMPRESSION: 1. No acute intracranial pathology. 2. Unchanged chronic white matter microangiopathy. Electronically Signed   By: Valetta Mole M.D.   On: 08/26/2021 15:37   MR BRAIN WO CONTRAST  Result Date: 08/26/2021 CLINICAL DATA:  Neuro deficit, acute, stroke suspected EXAM: MRI HEAD WITHOUT CONTRAST TECHNIQUE: Multiplanar, multiecho pulse sequences of the brain and surrounding structures were obtained without intravenous contrast. COMPARISON:  None. FINDINGS: Motion artifact is present on some sequences. Brain: There is no acute infarction or intracranial hemorrhage. There is no intracranial mass, mass effect, or edema. There is no hydrocephalus. There is a thin extra-axial collection along the posterior right cerebral convexity without mass effect. Prominence of the ventricles and sulci reflects mild parenchymal volume loss. Patchy and confluent T2 hyperintensity in the supratentorial and pontine white matter is nonspecific but may reflect moderate to advanced chronic microvascular ischemic changes. Small chronic left caudate infarct. Vascular: Major vessel flow voids at the skull base are preserved. Skull and upper cervical spine: Normal marrow signal is preserved. Sinuses/Orbits:  Paranasal sinuses are aerated. Bilateral lens replacements. Other: Sella is unremarkable.  Mastoid air cells are clear. IMPRESSION: No acute infarct. Moderate to advanced chronic microvascular ischemic changes. Trace posterior right cerebral convexity subdural collection. Probably non-acute. Electronically Signed   By: Macy Mis M.D.   On: 08/26/2021 17:03     Assessment/Plan Active Problems:   Hyponatremia  Hyponatremia -moderate to severe hyponatremia w/ Na 119 -noted to be acute on chronic issue with some concern for SIADH in the past -suspect hypovolemic etiology in setting of recurrent emesis  -pt clinically dry on exam  -s/p 1L NS bolus in ER  -will check repeat Na x 1 now  -start on gentle MIVF  -check urine and serum studies  -goal of 6-8 mEq increase in Na over next  -recent UTI appears to be clinically resolved at present -UA WNL  -weakness appears to be an acute on chronic issue  - No overt signs of infection at present - We will give gentle IV fluids and trend sodium - Some question of right-sided weakness on ER evaluation - Both MRI and CT of the head are grossly normal for any acute changes - No overt right-sided weakness on my exam at present -We will continue to monitor with IV fluid hydration - Follow  3-MDS  -cells lines appear stable at present  -was previously taking low dose prednisone per hem-onc recs  -however, pt not taking at present  -will continue to monitor for now   4-Hx/o breast and lung cancer  -clinically resolved s/p treatment per 12/2020 H-O note     DVT prophylaxis: ppx lovenox Code Status: Full code Family Communication: No family at bedside  Disposition Plan: Pending further evaluation. Anticipate 1-2 [place] stay Consults called: None  Admission status: Inpatient  Deneise Lever MD [facility] Pager [phone]   If 7PM-7AM, please contact night-coverage [email or web address] Password [email or web address]  08/26/2021, 6:50 PM

## 2021-08-26 NOTE — ED Provider Notes (Signed)
[facility] EMERGENCY DEPARTMENT Provider Note   CSN: [id number] Arrival date & time: 08/26/21  1123     History Chief Complaint  Patient presents with   Dizziness    Lisa [name] is a 87 y.o. female.  HPI   Patient with significant medical history of CML, adenocarcinoma of left lung, hypertension presents to the emergency department with chief complaint of nausea vomiting diarrhea and dizziness.  Patient states this all started on Thursday, she states that she has been having multiple episodes of vomiting, has been unable to hold anything down, she also notes that she is having some diarrhea, she denies hematemesis, coffee-ground emesis, hematochezia or melena, she denies any urinary symptoms.  Patient dates she has no stomach pain, she denies any associated fevers, chills, nasal congestion, sore throat, cough, general body aches, she is not vaccinated COVID-19, denies recent sick contacts.  Denies history of C. difficile no recent hospitalization, does state that she is currently finished a course of antibiotics for UTI she finishes on Wednesday.  She has no severe abdominal history, no history of bowel obstructions, diverticulitis, pancreatitis, PUD.  She does note that she has felt dizzy today, dizziness occurs when she she changes positions, she describes it as a feeling of off balance,.  She states that due to this she has fallen 3 times today, she states that she did hit her head, but denies losing conscious, is not on anticoagulant.  On the last fall she was unable to get up and was on the floor for approximately 30 minutes until family which came back and helped her up.  She states that she is feeling some generalized weakness, she feels more weak on the right side, she currently has no feeling of dizziness, headaches, change in vision, paresthesias of her lower extremities.  She has no other complaints at this time.    Daughter was at bedside states that patient was at her normal  baseline around 9 PM last night, and then today she started to have her weakness.  Past Medical History:  Diagnosis Date   Adenocarcinoma of left lung ([place]) 2006   Arthritis    Asthma    Cancer of breast, female ([place])    Cancer of lung ([place])    Hypertension    Hypothyroidism    Invasive ductal carcinoma of left breast ([place]) 1999   Neutropenia ([place]) 06/09/2016   Personal history of radiation therapy     Patient Active Problem List   Diagnosis Date Noted   MDS (myelodysplastic syndrome) ([place]) 05/14/2021   Hyponatremia    Labile blood pressure    Debility 10/30/2020   Anemia of chronic disease    New onset atrial fibrillation ([place])    Malnutrition of moderate degree 10/28/2020   Bilateral knee effusions    FTT (failure to thrive) in adult    Decreased appetite    Weakness    Acute renal failure superimposed on stage 3b chronic kidney disease ([place]) 10/21/2020   Gout 10/21/2020   Hypothyroidism 10/21/2020   HTN (hypertension) 10/21/2020   Weight loss 10/21/2020   Generalized muscle weakness 10/21/2020   Osteoarthritis 10/21/2020   Atrial fibrillation with RVR ([place]) 10/21/2020   Deficiency anemia 03/13/2019   Acute respiratory failure with hypoxia ([place]) 05/31/2017   Hyperglycemia 05/31/2017   Chronic kidney disease, stage 3 unspecified ([place]) 05/31/2017   Normocytic anemia 05/31/2017   Acute exacerbation of chronic obstructive airways disease ([place]) 05/31/2017   Abdominal aortic aneurysm 05/31/2017   Urinary incontinence  05/31/2017   Vitamin D deficiency 06/10/2016   Neutropenia ([place]) 06/09/2016   Invasive ductal carcinoma of left breast ([place]) 06/09/2016   Adenocarcinoma of left lung ([place]) 06/09/2016   Malignant neoplasm of unspecified site of unspecified female breast ([place]) 06/09/2016   Adenomatous polyps 02/08/2013    Past Surgical History:  Procedure Laterality Date   BIOPSY  10/23/2020   Procedure: BIOPSY;  Surgeon: Yetta Flock, MD;  Location: [facility] ENDOSCOPY;   Service: Gastroenterology;;   BREAST BIOPSY     left axillary node dissection   BREAST LUMPECTOMY Left    COLONOSCOPY  03/08/2003   YIA:XKPVVZSMOL rectal polyps destroyed with the tip of the snare/Polyps at hepatic flexure, splenic flexure at 73 cm/Left-sided diverticula: unable to retrieve path   COLONOSCOPY  09/12/2008   MBE:MLJQGB rectum and distal sigmoid diminutive polyps/scattered left sided diverticulum. hyperplastic   COLONOSCOPY N/A 03/06/2013   EEF:EOFHQRF polyp-removed as described above; colonic diverticulosis. hyperplastic polyps. next TCS 03/2018   ESOPHAGOGASTRODUODENOSCOPY (EGD) WITH PROPOFOL N/A 10/23/2020   Procedure: ESOPHAGOGASTRODUODENOSCOPY (EGD) WITH PROPOFOL;  Surgeon: Yetta Flock, MD;  Location: [place];  Service: Gastroenterology;  Laterality: N/A;   FOOT SURGERY     LUNG REMOVAL, PARTIAL     upper lobe     OB History   No obstetric history on file.     Family History  Problem Relation Age of Onset   Cancer Mother    Cancer Sister    Colon cancer Neg Hx     Social History   Tobacco Use   Smoking status: Former    Packs/day: 0.75    Years: 35.00    Pack years: 26.25    Types: Cigarettes    Quit date: 1993    Years since quitting: 29.8   Smokeless tobacco: Never   Tobacco comments:    smoke-free X 30 yeras  Vaping Use   Vaping Use: Never used  Substance Use Topics   Alcohol use: No   Drug use: No    Home Medications Prior to Admission medications   Medication Sig Start Date End Date Taking? Authorizing Provider  acetaminophen (TYLENOL) 325 MG tablet Take 2 tablets (650 mg total) by mouth every 6 (six) hours. 11/11/20  Yes Angiulli, Lavon Paganini, PA-C  furosemide (LASIX) 20 MG tablet Take 20 mg by mouth daily.   Yes [provider]  Iron-FA-B Cmp-C-Biot-Probiotic (FUSION PLUS) CAPS Take 1 capsule by mouth once a week.   Yes [provider]  levothyroxine (SYNTHROID) 100 MCG tablet Take 100 mcg by mouth daily before  breakfast.   Yes [provider]  Magnesium 400 MG TABS Take 400 mg by mouth daily.   Yes [provider]  Melatonin 5 MG CAPS Take 1 capsule by mouth daily as needed (sleep).   Yes [provider]  metoprolol succinate (TOPROL-XL) 50 MG 24 hr tablet Take 50 mg by mouth daily. 12/20/20  Yes [provider]  mirtazapine (REMERON) 7.5 MG tablet Take 1 tablet (7.5 mg total) by mouth at bedtime. 05/28/21  Yes Heath Lark, MD  OVER THE COUNTER MEDICATION Take 2 tablets by mouth daily as needed (nausea). Nausea medication   Yes [provider]  pantoprazole (PROTONIX) 40 MG tablet Take 1 tablet (40 mg total) by mouth daily. 11/11/20  Yes Angiulli, Lavon Paganini, PA-C  polyethylene glycol (MIRALAX / GLYCOLAX) 17 g packet Take 17 g by mouth daily as needed for moderate constipation. 10/30/20  Yes Kayleen Memos, DO  senna-docusate (  SENOKOT-S) 8.6-50 MG tablet Take 1 tablet by mouth at bedtime. 11/11/20  Yes Angiulli, Lavon Paganini, PA-C  umeclidinium bromide (INCRUSE ELLIPTA) 62.5 MCG/INH AEPB Inhale into the lungs daily. 12/31/20  Yes [provider]  Vitamin D, Ergocalciferol, (DRISDOL) 1.25 MG (50000 UNIT) CAPS capsule Take 50,000 Units by mouth once a week. 01/10/21  Yes [provider]  albuterol (PROVENTIL,VENTOLIN) 90 MCG/ACT inhaler Inhale 2 puffs into the lungs every 6 (six) hours as needed for wheezing or shortness of breath. Patient not taking: No sig reported 11/11/20   Angiulli, Lavon Paganini, PA-C  ciprofloxacin (CIPRO) 500 MG tablet Take 500 mg by mouth 2 (two) times daily. Patient not taking: Reported on 08/26/2021 08/14/21   [provider]  loratadine (CLARITIN) 10 MG tablet Take 10 mg by mouth at bedtime as needed. 01/18/21   [provider]  predniSONE (DELTASONE) 10 MG tablet Take 1 tablet (10 mg total) by mouth daily with breakfast. Patient not taking: Reported on 08/26/2021 05/14/21   Heath Lark, MD  traMADol (ULTRAM) 50 MG  tablet Take 1 tablet (50 mg total) by mouth every 6 (six) hours as needed for moderate pain. Patient not taking: Reported on 08/26/2021 11/11/20   Angiulli, Lavon Paganini, PA-C    Allergies    Meloxicam and Micardis hct [telmisartan-hctz]  Review of Systems   Review of Systems  Constitutional:  Negative for chills and fever.  HENT:  Negative for congestion.   Respiratory:  Negative for shortness of breath.   Cardiovascular:  Negative for chest pain.  Gastrointestinal:  Positive for diarrhea, nausea and vomiting. Negative for abdominal pain and blood in stool.  Genitourinary:  Negative for enuresis.  Musculoskeletal:  Negative for back pain.  Skin:  Negative for rash.  Neurological:  Positive for weakness. Negative for dizziness.  Hematological:  Does not bruise/bleed easily.   Physical Exam Updated Vital Signs BP 137/75 (BP Location: Left Arm)   Pulse 74   Temp 97.7 F (36.5 C) (Oral)   Resp 18   Ht '5\' 6"'  (1.676 m)   Wt 66.2 kg   SpO2 96%   BMI 23.57 kg/m   Physical Exam Vitals and nursing note reviewed.  Constitutional:      General: She is not in acute distress.    Appearance: She is not ill-appearing.  HENT:     Head: Normocephalic and atraumatic.     Comments: No deformities of the head present, no raccoon eyes or battle sign present.    Nose: No congestion.     Mouth/Throat:     Mouth: Mucous membranes are moist.     Pharynx: Oropharynx is clear.  Eyes:     Extraocular Movements: Extraocular movements intact.     Conjunctiva/sclera: Conjunctivae normal.     Pupils: Pupils are equal, round, and reactive to light.  Cardiovascular:     Rate and Rhythm: Normal rate and regular rhythm.     Pulses: Normal pulses.     Heart sounds: No murmur heard.   No friction rub. No gallop.  Pulmonary:     Effort: No respiratory distress.     Breath sounds: No wheezing, rhonchi or rales.  Chest:     Chest wall: No tenderness.  Abdominal:     Palpations: Abdomen is soft.      Tenderness: There is no abdominal tenderness. There is no right CVA tenderness or left CVA tenderness.     Comments: Abdomen nondistended, normal bowel sounds, dull to percussion, very minimal tenderness  in her lower abdomen, no guarding, rebound tenderness, peritoneal sign, negative McBurney point or Rovsing sign.  Musculoskeletal:     Cervical back: Neck supple. No tenderness.     Comments: Full range of motion all 4 extremities, neurovascular fully intact.  Spine was palpated was nontender to palpation.  Skin:    General: Skin is warm and dry.  Neurological:     Mental Status: She is alert.     GCS: GCS eye subscore is 4. GCS verbal subscore is 5. GCS motor subscore is 6.     Cranial Nerves: Cranial nerves 2-12 are intact. No cranial nerve deficit.     Sensory: Sensation is intact.     Motor: Weakness present.     Coordination: Romberg sign positive.     Comments: Cranial nerves II through XII are grossly intact, no difficult word finding, no several words, she has noted right-sided weakness, 3 out of 5 strength on the right upper and right lower versus 4-5 strength in the left upper/right lower.  She has positive right arm drift, sensation is fully intact.  Ambulation will be deferred due to fall risk.  Psychiatric:        Mood and Affect: Mood normal.    ED Results / Procedures / Treatments   Labs (all labs ordered are listed, but only abnormal results are displayed) Labs Reviewed  COMPREHENSIVE METABOLIC PANEL - Abnormal; Notable for the following components:      Result Value   Sodium 119 (*)    Potassium 3.4 (*)    Chloride 83 (*)    Calcium 8.4 (*)    Total Protein 8.8 (*)    Albumin 2.9 (*)    All other components within normal limits  CBC - Abnormal; Notable for the following components:   WBC 2.9 (*)    RBC 3.71 (*)    Hemoglobin 10.7 (*)    HCT 35.0 (*)    RDW 17.2 (*)    Platelets 84 (*)    nRBC 11.6 (*)    All other components within normal limits  PROTIME-INR  - Abnormal; Notable for the following components:   Prothrombin Time 15.3 (*)    All other components within normal limits  RESP PANEL BY RT-PCR (FLU A&B, COVID) ARPGX2  LIPASE, BLOOD  URINALYSIS, ROUTINE W REFLEX MICROSCOPIC  LACTIC ACID, PLASMA  CBC WITH DIFFERENTIAL/PLATELET  COMPREHENSIVE METABOLIC PANEL  COMPREHENSIVE METABOLIC PANEL    EKG None  Radiology CT Head Wo Contrast  Result Date: 08/26/2021 CLINICAL DATA:  TIA, headache and dizziness after falling at home at 3 a.m. this morning EXAM: CT HEAD WITHOUT CONTRAST TECHNIQUE: Contiguous axial images were obtained from the base of the skull through the vertex without intravenous contrast. COMPARISON:  CT head 03/30/2021 FINDINGS: Brain: There is no acute intracranial hemorrhage, extra-axial fluid collection, or acute infarct. There is mild parenchymal volume loss. The ventricles are not enlarged. Foci of hypodensity in the subcortical and periventricular white matter likely reflects sequela of chronic white matter microangiopathy. There is no mass lesion.  There is no midline shift. Vascular: There is calcification of the bilateral cavernous ICAs. Skull: Normal. Negative for fracture or focal lesion. Sinuses/Orbits: The imaged paranasal sinuses are clear. Bilateral lens implants are in place. The globes and orbits are otherwise unremarkable. Other: None. IMPRESSION: 1. No acute intracranial pathology. 2. Unchanged chronic white matter microangiopathy. Electronically Signed   By: Valetta Mole M.D.   On: 08/26/2021 15:37   MR BRAIN WO CONTRAST  Result Date: 08/26/2021 CLINICAL DATA:  Neuro deficit, acute, stroke suspected EXAM: MRI HEAD WITHOUT CONTRAST TECHNIQUE: Multiplanar, multiecho pulse sequences of the brain and surrounding structures were obtained without intravenous contrast. COMPARISON:  None. FINDINGS: Motion artifact is present on some sequences. Brain: There is no acute infarction or intracranial hemorrhage. There is no  intracranial mass, mass effect, or edema. There is no hydrocephalus. There is a thin extra-axial collection along the posterior right cerebral convexity without mass effect. Prominence of the ventricles and sulci reflects mild parenchymal volume loss. Patchy and confluent T2 hyperintensity in the supratentorial and pontine white matter is nonspecific but may reflect moderate to advanced chronic microvascular ischemic changes. Small chronic left caudate infarct. Vascular: Major vessel flow voids at the skull base are preserved. Skull and upper cervical spine: Normal marrow signal is preserved. Sinuses/Orbits: Paranasal sinuses are aerated. Bilateral lens replacements. Other: Sella is unremarkable.  Mastoid air cells are clear. IMPRESSION: No acute infarct. Moderate to advanced chronic microvascular ischemic changes. Trace posterior right cerebral convexity subdural collection. Probably non-acute. Electronically Signed   By: Macy Mis M.D.   On: 08/26/2021 17:03    Procedures .Critical Care Performed by: Marcello Fennel, PA-C Authorized by: Marcello Fennel, PA-C   Critical care provider statement:    Critical care time (minutes):  30   Critical care time was exclusive of:  Separately billable procedures and treating other patients   Critical care was necessary to treat or prevent imminent or life-threatening deterioration of the following conditions:  Metabolic crisis (Severe hyponatremia symptomatic.)   Critical care was time spent personally by me on the following activities:  Ordering and performing treatments and interventions, ordering and review of laboratory studies, ordering and review of radiographic studies, pulse oximetry, re-evaluation of patient's condition, review of old charts, examination of patient and evaluation of patient's response to treatment   I assumed direction of critical care for this patient from another provider in my specialty: no     Care discussed with:  admitting provider     Medications Ordered in ED Medications  enoxaparin (LOVENOX) injection 40 mg (has no administration in time range)  0.9 % NaCl with KCl 20 mEq/ L  infusion ( Intravenous New Bag/Given 08/26/21 1843)  ondansetron (ZOFRAN) tablet 4 mg (has no administration in time range)    Or  ondansetron (ZOFRAN) injection 4 mg (has no administration in time range)  sodium chloride 0.9 % bolus 1,000 mL (0 mLs Intravenous Stopped 08/26/21 1631)    ED Course  I have reviewed the triage vital signs and the nursing notes.  Pertinent labs & imaging results that were available during my care of the patient were reviewed by me and considered in my medical decision making (see chart for details).    MDM Rules/Calculators/A&P                          Initial impression-patient with nausea vomiting dizziness and weakness.  She is alert, does not appear in acute stress, vital signs reassuring.  Patient is noted right-sided weakness, she unfortunate is outside the stroke window as her last known well 9 PM today, no evidence for large vessel occlusion, will obtain basic lab work-up, CT head and continue to monitor.  Work-up-CBC shows pancytopenia but her leukocytopenia at baseline, normocytic anemia 10.7 above her baseline, platelets of 84 above her baseline.  CMP shows hyponatremia of 119, potassium 3.4, chloride 83, calcium 8.4, lipase is 45,  lactic 1.2, prothrombin time 15.3, INR 1.2 CT head is negative for acute findings.  Reassessment-the patient has a sodium of 119, likely secondary due to nausea vomiting diarrhea, possible this could be the cause of her weakness but her presentation of right-sided weakness is atypical.  CT head was negative for acute findings, will proceed with MRI without for rule out of stroke.  MRI was negative for acute findings, patient was updated on lab or imaging, likely her weakness is from hyponatremia, from possible gastroenteritis.  She is agreeable for  admission at this time.  Will consult hospitalist team for admission.   Consult-spoke with Dr. Ernestina Patches who will admit the patient.  Rule out-low suspicion for intracranial head bleed and or mass as CT imaging is negative for acute findings.  I have low suspicion for CVA as her MRI brain was negative for acute findings.  I have low suspicion for dissection or aneurysm of the vertebral/carotid arteries as presentation is atypical of etiology.  I have low suspicion for meningitis as she has no meningeal sign, she has low risk factors, nontoxic-appearing, no leukocytosis present.  I have low suspicion for systemic infection as patient nontoxic-appearing, vital signs are reassuring.  I have low suspicion for bowel obstruction as abdomen is nondistended normal bowel sounds, dull to percussion, she is tolerating p.o. I have low suspicion for liver or gallbladder abnormality as liver enzymes, alk phos are all within normal limits, no right upper quadrant tenderness present.  Low suspicion for pancreatitis as lipase within normal limits.  Plan-admission due to weakness likely secondary due to hyponatremia poor p.o. intake.  Final Clinical Impression(s) / ED Diagnoses Final diagnoses:  Weakness  Hyponatremia  Nausea vomiting and diarrhea    Rx / DC Orders ED Discharge Orders     None        Marcello Fennel, PA-C 08/26/21 [place], [place], DO 08/27/21 [phone]

## 2021-08-26 NOTE — ED Triage Notes (Signed)
Pt arrives via [facility] from home d/t NVD since Thursday, dizzyness started this AM, pt states she fell x 3 today, denies LOC/hitting head, just finished abx d/t UTI, vitals wdl during triage. A&Ox4. EMS states ambulatory w/assistance to stretcher.

## 2021-08-26 NOTE — ED Notes (Signed)
Pt stating she normally wears 2 liters oxygen at night

## 2021-08-26 NOTE — ED Notes (Signed)
Pt transported to MRI 

## 2021-08-26 NOTE — ED Notes (Signed)
Pts grandson, Lisa [name], would like to be contacted with updates & information, approval from pt received for information to be given to grandson, contact info # [phone]. PA made aware.

## 2021-08-27 ENCOUNTER — Encounter (HOSPITAL_COMMUNITY): Payer: Self-pay | Admitting: Family Medicine

## 2021-08-27 DIAGNOSIS — E871 Hypo-osmolality and hyponatremia: Secondary | ICD-10-CM | POA: Diagnosis not present

## 2021-08-27 DIAGNOSIS — D469 Myelodysplastic syndrome, unspecified: Secondary | ICD-10-CM

## 2021-08-27 DIAGNOSIS — M6281 Muscle weakness (generalized): Secondary | ICD-10-CM

## 2021-08-27 DIAGNOSIS — I10 Essential (primary) hypertension: Secondary | ICD-10-CM

## 2021-08-27 DIAGNOSIS — N1832 Chronic kidney disease, stage 3b: Secondary | ICD-10-CM

## 2021-08-27 DIAGNOSIS — R627 Adult failure to thrive: Secondary | ICD-10-CM

## 2021-08-27 DIAGNOSIS — N179 Acute kidney failure, unspecified: Secondary | ICD-10-CM

## 2021-08-27 LAB — COMPREHENSIVE METABOLIC PANEL
ALT: 14 U/L (ref 0–44)
ALT: 13 U/L (ref 0–44)
AST: 29 U/L (ref 15–41)
AST: 28 U/L (ref 15–41)
Albumin: 2.6 g/dL — ABNORMAL LOW (ref 3.5–5.0)
Albumin: 2.5 g/dL — ABNORMAL LOW (ref 3.5–5.0)
Alkaline Phosphatase: 43 U/L (ref 38–126)
Alkaline Phosphatase: 42 U/L (ref 38–126)
Anion gap: 7 (ref 5–15)
Anion gap: 8 (ref 5–15)
BUN: 8 mg/dL (ref 8–23)
BUN: 7 mg/dL — ABNORMAL LOW (ref 8–23)
CO2: 27 mmol/L (ref 22–32)
CO2: 27 mmol/L (ref 22–32)
Calcium: 8 mg/dL — ABNORMAL LOW (ref 8.9–10.3)
Calcium: 8.1 mg/dL — ABNORMAL LOW (ref 8.9–10.3)
Chloride: 90 mmol/L — ABNORMAL LOW (ref 98–111)
Chloride: 91 mmol/L — ABNORMAL LOW (ref 98–111)
Creatinine, Ser: 0.65 mg/dL (ref 0.44–1.00)
Creatinine, Ser: 0.69 mg/dL (ref 0.44–1.00)
GFR, Estimated: >60 mL/min (ref >60)
GFR, Estimated: >60 mL/min (ref >60)
Glucose, Bld: 78 mg/dL (ref 70–99)
Glucose, Bld: 73 mg/dL (ref 70–99)
Potassium: 3.4 mmol/L — ABNORMAL LOW (ref 3.5–5.1)
Potassium: 3.2 mmol/L — ABNORMAL LOW (ref 3.5–5.1)
Sodium: 124 mmol/L — ABNORMAL LOW (ref 135–145)
Sodium: 126 mmol/L — ABNORMAL LOW (ref 135–145)
Total Bilirubin: 0.8 mg/dL (ref 0.3–1.2)
Total Bilirubin: 0.5 mg/dL (ref 0.3–1.2)
Total Protein: 7.8 g/dL (ref 6.5–8.1)
Total Protein: 7.5 g/dL (ref 6.5–8.1)

## 2021-08-27 LAB — CBC WITH DIFFERENTIAL/PLATELET
Basophils Absolute: 0 10*3/uL (ref 0.0–0.1)
Basophils Relative: 0 %
Eosinophils Absolute: 0 10*3/uL (ref 0.0–0.5)
Eosinophils Relative: 0 %
HCT: 31.5 % — ABNORMAL LOW (ref 36.0–46.0)
Hemoglobin: 9.4 g/dL — ABNORMAL LOW (ref 12.0–15.0)
Lymphocytes Relative: 36 %
Lymphs Abs: 1.3 10*3/uL (ref 0.7–4.0)
MCH: 29.5 pg (ref 26.0–34.0)
MCHC: 29.8 g/dL — ABNORMAL LOW (ref 30.0–36.0)
MCV: 98.7 fL (ref 80.0–100.0)
Monocytes Absolute: 2.1 10*3/uL — ABNORMAL HIGH (ref 0.1–1.0)
Monocytes Relative: 57 %
Neutro Abs: 0.3 10*3/uL — CL (ref 1.7–7.7)
Neutrophils Relative %: 7 %
Platelets: 89 10*3/uL — ABNORMAL LOW (ref 150–400)
RBC: 3.19 MIL/uL — ABNORMAL LOW (ref 3.87–5.11)
RDW: 17.6 % — ABNORMAL HIGH (ref 11.5–15.5)
WBC: 3.7 10*3/uL — ABNORMAL LOW (ref 4.0–10.5)
nRBC: 12 /100 WBC — ABNORMAL HIGH
nRBC: 6.2 % — ABNORMAL HIGH (ref 0.0–0.2)

## 2021-08-27 LAB — OSMOLALITY: Osmolality: 258 mOsm/kg — ABNORMAL LOW (ref 275–295)

## 2021-08-27 LAB — OSMOLALITY, URINE: Osmolality, Ur: 148 mOsm/kg — ABNORMAL LOW (ref 300–900)

## 2021-08-27 LAB — MRSA NEXT GEN BY PCR, NASAL: MRSA by PCR Next Gen: NOT DETECTED

## 2021-08-27 LAB — MAGNESIUM: Magnesium: 1.4 mg/dL — ABNORMAL LOW (ref 1.7–2.4)

## 2021-08-27 MED ORDER — ACETAMINOPHEN 325 MG PO TABS
650.0000 mg | ORAL_TABLET | Freq: Four times a day (QID) | ORAL | Status: DC | PRN
  Administered 2021-08-27: 650 mg via ORAL
  Filled 2021-08-27: qty 2

## 2021-08-27 MED ORDER — ENSURE ENLIVE PO LIQD
237.0000 mL | Freq: Two times a day (BID) | ORAL | Status: DC
  Administered 2021-08-27 – 2021-08-28 (×3): 237 mL via ORAL

## 2021-08-27 MED ORDER — MAGNESIUM SULFATE 4 GM/100ML IV SOLN
4.0000 g | Freq: Once | INTRAVENOUS | Status: AC
  Administered 2021-08-27: 4 g via INTRAVENOUS
  Filled 2021-08-27: qty 100

## 2021-08-27 MED ORDER — POTASSIUM CHLORIDE CRYS ER 20 MEQ PO TBCR
40.0000 meq | EXTENDED_RELEASE_TABLET | Freq: Once | ORAL | Status: AC
  Administered 2021-08-27: 40 meq via ORAL
  Filled 2021-08-27: qty 2

## 2021-08-27 MED ORDER — INFLUENZA VAC A&B SA ADJ QUAD 0.5 ML IM PRSY
0.5000 mL | PREFILLED_SYRINGE | INTRAMUSCULAR | Status: AC
  Administered 2021-08-28: 0.5 mL via INTRAMUSCULAR
  Filled 2021-08-27: qty 0.5

## 2021-08-27 MED ORDER — CHLORHEXIDINE GLUCONATE CLOTH 2 % EX PADS
6.0000 | MEDICATED_PAD | Freq: Every day | CUTANEOUS | Status: DC
  Administered 2021-08-27 – 2021-08-28 (×2): 6 via TOPICAL

## 2021-08-27 MED ORDER — COVID-19MRNA BIVAL VAC MODERNA 50 MCG/0.5ML IM SUSP
0.5000 mL | Freq: Once | INTRAMUSCULAR | Status: AC
  Administered 2021-08-28: 0.5 mL via INTRAMUSCULAR
  Filled 2021-08-27: qty 0.5

## 2021-08-27 NOTE — Progress Notes (Signed)
Initial Nutrition Assessment  DOCUMENTATION CODES:   Non-severe (moderate) malnutrition in context of chronic illness  INTERVENTION:  Ensure Enlive po BID, each supplement provides 350 kcal and 20 grams of protein   Recommend ST evaluate swallow   Suggest liberalized diet during acute illness  NUTRITION DIAGNOSIS:   Moderate Malnutrition related to chronic illness as evidenced by percent weight loss, energy intake < or equal to 75% for > or equal to 1 month, moderate fat depletion, mild muscle depletion, moderate muscle depletion.   GOAL:  Patient will meet greater than or equal to 90% of their needs   MONITOR:  PO intake, Supplement acceptance, Labs, Weight trends  REASON FOR ASSESSMENT:   Malnutrition Screening Tool    ASSESSMENT: patient is a 87 yo female with hx of lung cancer, breast cancer and CKD 3b. Complains of weakness, nausea/vomiting and falls PTA. Hyponatremia, Failure to thrive, weight loss.   Patient is eating poorly 25% of breakfast and is eating a few bites of lunch. Able to feed herself. Patient daughter lives with her and assist with care. Patient complains that food is getting stuck in her throat.   Patient weight is down 4% (2.6 kg) the past 2 months. Based on hospital records pt has been losing weight for several months. May wt. 74 kg a loss of 20% (15 kg) x 5 months significant.   Medications reviewed and include: Klor-con.  IVF- NS@ 55 ml/hr and magnesium sulfate 4 gr x 1 dose.   Labs: BMP Latest Ref Rng & Units 08/27/2021 08/27/2021 08/26/2021  Glucose 70 - 99 mg/dL 73 78 84  BUN 8 - 23 mg/dL 7(L) 8 9  Creatinine 0.44 - 1.00 mg/dL 0.69 0.65 0.74  Sodium 135 - 145 mmol/L 126(L) 124(L) 119(LL)  Potassium 3.5 - 5.1 mmol/L 3.2(L) 3.4(L) 3.4(L)  Chloride 98 - 111 mmol/L 91(L) 90(L) 83(L)  CO2 22 - 32 mmol/L 27 27 27   Calcium 8.9 - 10.3 mg/dL 8.1(L) 8.0(L) 8.4(L)      NUTRITION - FOCUSED PHYSICAL EXAM: Nutrition-Focused physical exam completed.  Findings are moderate upper arm, mild orbital fat depletion, severe clavicle, moderate deltoid, dorsal muscle depletion, and no edema.     Diet Order:   Diet Order             Diet Heart Room service appropriate? Yes; Fluid consistency: Thin  Diet effective now                   EDUCATION NEEDS:  Education needs have been addressed  Skin:  Skin Assessment: Reviewed RN Assessment  Last BM:  10/26 type 6  Height:   Ht Readings from Last 1 Encounters:  08/27/21 5\' 6"  (1.676 m)    Weight:   Wt Readings from Last 1 Encounters:  08/27/21 59.1 kg    Ideal Body Weight:   59 kg   BMI:  Body mass index is 21.03 kg/m.  Estimated Nutritional Needs:   Kcal:  1700-1800  Protein:  80-85 gr  Fluid:  1600 ml daily  Colman Cater MS,RD,CSG,LDN Contact: Shea Evans

## 2021-08-27 NOTE — Evaluation (Signed)
Physical Therapy Evaluation Patient Details Name: Lisa [name] MRN: [id number] DOB: 16-Aug-1934 Today's Date: 08/27/2021  History of Present Illness  87 y.o. female with medical history significant of multiple medical issues including lung cancer, breast cancer, hypertension, neutropenia, MDS, presenting with hyponatremia and weakness.  Per report, patient has had about 3 falls today.  Patient has had worsening weakness over the past 1 to 2 days.  Patient noted to have been treated for UTI on October 13 with patient being started on Cipro.  Per the patient, she is at increased nausea and vomiting.  Per report, the family reports patient with overall decreased p.o. intake.  No no reported chest pain or shortness of breath.  No reported abdominal pain.  No reported nausea or diarrhea.  No reported head trauma associated with the fall.   Clinical Impression  Patient sitting up in bed and agreeable to therapy. Able to perform all bed mobilities with modified independence and set up assist. Able to transition to standing from sitting edge of bed with min guard after a couple tries. Very unsteady on feet but able to walk with min assist near bedside and walker. Patient with urge to go to restroom upon standing and could not wait so bedpan was used. Oxygen ranged from 86-89% on room air during session. Patient will continue to benefit from skilled physical therapy in hospital and recommended venue below to increase strength, balance, endurance for safe ADLs and gait.      Recommendations for follow up therapy are one component of a multi-disciplinary discharge planning process, led by the attending physician.  Recommendations may be updated based on patient status, additional functional criteria and insurance authorization.  Follow Up Recommendations Home health PT    Assistance Recommended at Discharge Intermittent Supervision/Assistance  Functional Status Assessment    Equipment Recommendations   3in1 (PT)    Recommendations for Other Services       Precautions / Restrictions Precautions Precautions: Fall      Mobility  Bed Mobility Overal bed mobility: Modified Independent             General bed mobility comments: slow movements    Transfers Overall transfer level: Needs assistance   Transfers: Sit to/from Stand Sit to Stand: Min guard           General transfer comment: cues to take deep breaths    Ambulation/Gait   Gait Distance (Feet): 8 Feet Assistive device: Rolling walker (2 wheels) Gait Pattern/deviations: Decreased stride length;Trunk flexed;Narrow base of support Gait velocity: reduced   General Gait Details: slow unsteady steps taken  Stairs            Wheelchair Mobility    Modified Rankin (Stroke Patients Only)       Balance Overall balance assessment: Modified Independent;Needs assistance   Sitting balance-Leahy Scale: Fair     Standing balance support: Bilateral upper extremity supported Standing balance-Leahy Scale: Poor                               Pertinent Vitals/Pain Pain Assessment: No/denies pain    Home Living                          Prior Function                       Hand Dominance  Extremity/Trunk Assessment        Lower Extremity Assessment Lower Extremity Assessment: Generalized weakness       Communication      Cognition Arousal/Alertness: Awake/alert                                              General Comments      Exercises General Exercises - Lower Extremity Ankle Circles/Pumps: AROM;10 reps;Both Long Arc Quad: AROM;10 reps;Both Hip Flexion/Marching: AROM;10 reps;Both   Assessment/Plan    PT Assessment Patient needs continued PT services  PT Problem List Decreased strength;Decreased balance;Decreased mobility;Decreased activity tolerance;Decreased range of motion       PT Treatment Interventions Gait  training;Therapeutic activities;Therapeutic exercise;Patient/family education;Neuromuscular re-education;Balance training;Manual techniques    PT Goals (Current goals can be found in the Care Plan section)  Acute Rehab PT Goals Patient Stated Goal: to go home PT Goal Formulation: With patient Time For Goal Achievement: 09/10/21 Potential to Achieve Goals: Good    Frequency Min 3X/week   Barriers to discharge        Co-evaluation               AM-PAC PT "6 Clicks" Mobility  Outcome Measure Help needed turning from your back to your side while in a flat bed without using bedrails?: None Help needed moving from lying on your back to sitting on the side of a flat bed without using bedrails?: None Help needed moving to and from a bed to a chair (including a wheelchair)?: A Little Help needed standing up from a chair using your arms (e.g., wheelchair or bedside chair)?: A Little Help needed to walk in hospital room?: A Lot Help needed climbing 3-5 steps with a railing? : A Lot 6 Click Score: 18    End of Session Equipment Utilized During Treatment: Gait belt   Patient left: in bed;with call bell/phone within reach   PT Visit Diagnosis: Unsteadiness on feet (R26.81);History of falling (Z91.81);Muscle weakness (generalized) (M62.81)    Time: 1017-5102 PT Time Calculation (min) (ACUTE ONLY): 33 min   Charges:   PT Evaluation $PT Eval Low Complexity: 1 Low PT Treatments $Therapeutic Exercise: 8-22 mins      5:12 PM, 08/27/21 Jerene Pitch, DPT Physical Therapy with [facility]  [phone] office

## 2021-08-27 NOTE — Plan of Care (Signed)
  Problem: Acute Rehab PT Goals(only PT should resolve) Goal: Pt Will Go Sit To Supine/Side Flowsheets (Taken 08/27/2021 1715) Pt will go Sit to Supine/Side: with modified independence Goal: Pt Will Transfer Bed To Chair/Chair To Bed Flowsheets (Taken 08/27/2021 1715) Pt will Transfer Bed to Chair/Chair to Bed: with modified independence Note: With RW Goal: Pt Will Ambulate Flowsheets (Taken 08/27/2021 1715) Pt will Ambulate:  15 feet  with min guard assist  with rolling walker    5:15 PM, 08/27/21 Jerene Pitch, DPT Physical Therapy with [facility]  [phone] office

## 2021-08-27 NOTE — Progress Notes (Addendum)
PROGRESS NOTE   Lisa [name]  BLT:[id number] DOB: 04/29/1934 DOA: 08/26/2021 PCP: Celene Squibb, MD   Chief Complaint  Patient presents with   Dizziness   Level of care: Telemetry  Brief Admission History:  87 y.o. female with medical history significant of multiple medical issues including lung cancer, breast cancer, hypertension, neutropenia, MDS, presenting with hyponatremia and weakness.  Per report, patient has had about 3 falls today.  Patient has had worsening weakness over the past 1 to 2 days.  Patient noted to have been treated for UTI on October 13 with patient being started on Cipro.  Per the patient, she is at increased nausea and vomiting.  Per report, the family reports patient with overall decreased p.o. intake.  No no reported chest pain or shortness of breath.  No reported abdominal pain.  No reported nausea or diarrhea.  No reported head trauma associated with the fall.  Assessment & Plan:   Principal Problem:   Hyponatremia Active Problems:   Normocytic anemia   Abdominal aortic aneurysm   Urinary incontinence   Acute renal failure superimposed on stage 3b chronic kidney disease (HCC)   Hypothyroidism   HTN (hypertension)   Weight loss   Generalized muscle weakness   Weakness   FTT (failure to thrive) in adult   Malnutrition of moderate degree   Malignant neoplasm of unspecified site of unspecified female breast (HCC)   MDS (myelodysplastic syndrome) (HCC)   Dehydration  - poor oral intake followed by emesis / severe nausea  - Pt responding well to IV fluid hydration.  - continue supportive measures  Hyponatremia  - acute exacerbation likely exacerbated by dehydration, poor oral intake. - improving with IV fluid hydration.  - follow sodium level - encourage oral intake  Hypokalemia/Hypomagnesemia - replacement ordered, recheck in AM.   MDS - she is followed closely by hem/onc  - pt reports no longer taking low dose prednisone  History of  breast and lung cancer  - s/p treatment and followed by oncology service.   DVT prophylaxis: SCD Code Status: FULL  Family Communication: patient updated at bedside  Disposition: TBD; anticipating home with [place] Status is: Inpatient  Remains inpatient appropriate because: continues to require IV fluid.    Consultants:    Procedures:  N/a  Antimicrobials:  N/a    Subjective: Pt reports having nausea but reports she is willing to try eating some breakfast.  No emesis since admission.   Objective: Vitals:   08/27/21 0105 08/27/21 0300 08/27/21 0700 08/27/21 0722  BP: (!) 170/76 (!) 157/79 (!) 148/60   Pulse: 88 85 69 80  Resp: (!) 26 (!) 23 (!) 26 (!) 25  Temp: 98.7 F (37.1 C)   97.9 F (36.6 C)  TempSrc: Oral   Oral  SpO2: 99% 91% 99% 99%  Weight: 59.1 kg     Height: 5\' 6"  (1.676 m)       Intake/Output Summary (Last 24 hours) at 08/27/2021 1022 Last data filed at 08/27/2021 0300 Gross per 24 hour  Intake 1530.91 ml  Output --  Net 1530.91 ml   Filed Weights   08/26/21 1143 08/27/21 0105  Weight: 66.2 kg 59.1 kg   Examination:  General exam: elderly frail awake, alert, Appears calm and comfortable  Respiratory system: Clear to auscultation. Respiratory effort normal. Cardiovascular system: normal S1 & S2 heard. No JVD, murmurs, rubs, gallops or clicks. No pedal edema. Gastrointestinal system: Abdomen is nondistended, soft and nontender. No organomegaly or masses felt.  Normal bowel sounds heard. Central nervous system: Alert and oriented. No focal neurological deficits. Extremities: Symmetric 5 x 5 power. Skin: No rashes, lesions or ulcers Psychiatry: Judgement and insight appear normal. Mood & affect appropriate.   Data Reviewed: I have personally reviewed following labs and imaging studies  CBC: Recent Labs  Lab 08/22/21 1002 08/26/21 1252 08/27/21 0440  WBC 2.8* 2.9* 3.7*  NEUTROABS 0.3*  --  0.3*  HGB 9.4* 10.7* 9.4*  HCT 33.2* 35.0* 31.5*  MCV  104.1* 94.3 98.7  PLT 45* 84* 89*    Basic Metabolic Panel: Recent Labs  Lab 08/22/21 1002 08/26/21 1252 08/27/21 0025 08/27/21 0440  NA 134* 119* 124* 126*  K 4.3 3.4* 3.4* 3.2*  CL 102 83* 90* 91*  CO2 26 27 27 27   GLUCOSE 90 84 78 73  BUN 12 9 8  7*  CREATININE 0.65 0.74 0.65 0.69  CALCIUM 8.6* 8.4* 8.0* 8.1*  MG  --   --   --  1.4*    GFR: Estimated Creatinine Clearance: 48 mL/min (by C-G formula based on SCr of 0.69 mg/dL).  Liver Function Tests: Recent Labs  Lab 08/22/21 1002 08/26/21 1252 08/27/21 0025 08/27/21 0440  AST 29 33 29 28  ALT 15 14 14 13   ALKPHOS 57 47 43 42  BILITOT 0.6 0.5 0.8 0.5  PROT 8.9* 8.8* 7.8 7.5  ALBUMIN 2.8* 2.9* 2.6* 2.5*    CBG: No results for input(s): GLUCAP in the last 168 hours.  Recent Results (from the past 240 hour(s))  Resp Panel by RT-PCR (Flu A&B, Covid) Nasopharyngeal Swab     Status: None   Collection Time: 08/26/21  7:57 PM   Specimen: Nasopharyngeal Swab; Nasopharyngeal(NP) swabs in vial transport medium  Result Value Ref Range Status   SARS Coronavirus 2 by RT PCR NEGATIVE NEGATIVE Final    Comment: (NOTE) SARS-CoV-2 target nucleic acids are NOT DETECTED.  The SARS-CoV-2 RNA is generally detectable in upper respiratory specimens during the acute phase of infection. The lowest concentration of SARS-CoV-2 viral copies this assay can detect is 138 copies/mL. A negative result does not preclude SARS-Cov-2 infection and should not be used as the sole basis for treatment or other patient management decisions. A negative result may occur with  improper specimen collection/handling, submission of specimen other than nasopharyngeal swab, presence of viral mutation(s) within the areas targeted by this assay, and inadequate number of viral copies(<138 copies/mL). A negative result must be combined with clinical observations, patient history, and epidemiological information. The expected result is Negative.  Fact  Sheet for Patients:  EntrepreneurPulse.com.au  Fact Sheet for Healthcare Providers:  IncredibleEmployment.be  This test is no t yet approved or cleared by the [place] FDA and  has been authorized for detection and/or diagnosis of SARS-CoV-2 by FDA under an Emergency Use Authorization (EUA). This EUA will remain  in effect (meaning this test can be used) for the duration of the COVID-19 declaration under Section 564(b)(1) of the Act, 21 U.S.C.section 360bbb-3(b)(1), unless the authorization is terminated  or revoked sooner.       Influenza A by PCR NEGATIVE NEGATIVE Final   Influenza B by PCR NEGATIVE NEGATIVE Final    Comment: (NOTE) The Xpert Xpress SARS-CoV-2/FLU/RSV plus assay is intended as an aid in the diagnosis of influenza from Nasopharyngeal swab specimens and should not be used as a sole basis for treatment. Nasal washings and aspirates are unacceptable for Xpert Xpress SARS-CoV-2/FLU/RSV testing.  Fact Sheet for Patients: EntrepreneurPulse.com.au  Fact Sheet for Healthcare Providers: IncredibleEmployment.be  This test is not yet approved or cleared by the [place] FDA and has been authorized for detection and/or diagnosis of SARS-CoV-2 by FDA under an Emergency Use Authorization (EUA). This EUA will remain in effect (meaning this test can be used) for the duration of the COVID-19 declaration under Section 564(b)(1) of the Act, 21 U.S.C. section 360bbb-3(b)(1), unless the authorization is terminated or revoked.  Performed at [facility], [address] [place], [place] [address]   MRSA Next Gen by PCR, Nasal     Status: None   Collection Time: 08/27/21 12:51 AM   Specimen: Nasal Mucosa; Nasal Swab  Result Value Ref Range Status   MRSA by PCR Next Gen NOT DETECTED NOT DETECTED Final    Comment: (NOTE) The GeneXpert MRSA Assay (FDA approved for NASAL specimens only), is one  component of a comprehensive MRSA colonization surveillance program. It is not intended to diagnose MRSA infection nor to guide or monitor treatment for MRSA infections. Test performance is not FDA approved in patients less than 6 years old. Performed at [facility], [address], [place], [place] [address]      Radiology Studies: CT Head Wo Contrast  Result Date: 08/26/2021 CLINICAL DATA:  TIA, headache and dizziness after falling at home at 3 a.m. this morning EXAM: CT HEAD WITHOUT CONTRAST TECHNIQUE: Contiguous axial images were obtained from the base of the skull through the vertex without intravenous contrast. COMPARISON:  CT head 03/30/2021 FINDINGS: Brain: There is no acute intracranial hemorrhage, extra-axial fluid collection, or acute infarct. There is mild parenchymal volume loss. The ventricles are not enlarged. Foci of hypodensity in the subcortical and periventricular white matter likely reflects sequela of chronic white matter microangiopathy. There is no mass lesion.  There is no midline shift. Vascular: There is calcification of the bilateral cavernous ICAs. Skull: Normal. Negative for fracture or focal lesion. Sinuses/Orbits: The imaged paranasal sinuses are clear. Bilateral lens implants are in place. The globes and orbits are otherwise unremarkable. Other: None. IMPRESSION: 1. No acute intracranial pathology. 2. Unchanged chronic white matter microangiopathy. Electronically Signed   By: Valetta Mole M.D.   On: 08/26/2021 15:37   MR BRAIN WO CONTRAST  Result Date: 08/26/2021 CLINICAL DATA:  Neuro deficit, acute, stroke suspected EXAM: MRI HEAD WITHOUT CONTRAST TECHNIQUE: Multiplanar, multiecho pulse sequences of the brain and surrounding structures were obtained without intravenous contrast. COMPARISON:  None. FINDINGS: Motion artifact is present on some sequences. Brain: There is no acute infarction or intracranial hemorrhage. There is no intracranial mass, mass effect, or  edema. There is no hydrocephalus. There is a thin extra-axial collection along the posterior right cerebral convexity without mass effect. Prominence of the ventricles and sulci reflects mild parenchymal volume loss. Patchy and confluent T2 hyperintensity in the supratentorial and pontine white matter is nonspecific but may reflect moderate to advanced chronic microvascular ischemic changes. Small chronic left caudate infarct. Vascular: Major vessel flow voids at the skull base are preserved. Skull and upper cervical spine: Normal marrow signal is preserved. Sinuses/Orbits: Paranasal sinuses are aerated. Bilateral lens replacements. Other: Sella is unremarkable.  Mastoid air cells are clear. IMPRESSION: No acute infarct. Moderate to advanced chronic microvascular ischemic changes. Trace posterior right cerebral convexity subdural collection. Probably non-acute. Electronically Signed   By: Macy Mis M.D.   On: 08/26/2021 17:03    Scheduled Meds:  Chlorhexidine Gluconate Cloth  6 each Topical Daily   [START ON 08/28/2021] COVID-19 mRNA bivalent  vaccine (Moderna)  0.5 mL Intramuscular ONCE-1600   enoxaparin (LOVENOX) injection  40 mg Subcutaneous Q24H   feeding supplement  237 mL Oral BID BM   [START ON 08/28/2021] influenza vaccine adjuvanted  0.5 mL Intramuscular Tomorrow-1000   potassium chloride  40 mEq Oral Once   Continuous Infusions:  0.9 % NaCl with KCl 20 mEq / L 75 mL/hr at 08/27/21 0717   magnesium sulfate bolus IVPB       LOS: 1 day   Time spent: 35 minutes   Kaia Depaolis Wynetta Emery, MD How to contact the [facility] Attending or Consulting provider [place] or covering provider during after hours [place], for this patient?  Check the care team in [facility] and look for a) attending/consulting TR[facility] provider listed and b) the [facility] Psychiatric team listed Log into [email or web address] and use [place]'s universal password to access. If you do not have the password, please contact the hospital operator. Locate the [facility]  provider you are looking for under [facility] and page to a number that you can be directly reached. If you still have difficulty reaching the provider, please page the [facility] (Director on Call) for the Hospitalists listed on amion for assistance.  08/27/2021, 10:22 AM

## 2021-08-28 ENCOUNTER — Inpatient Hospital Stay (HOSPITAL_COMMUNITY): Payer: Medicare Other

## 2021-08-28 DIAGNOSIS — E039 Hypothyroidism, unspecified: Secondary | ICD-10-CM

## 2021-08-28 DIAGNOSIS — N179 Acute kidney failure, unspecified: Secondary | ICD-10-CM | POA: Diagnosis not present

## 2021-08-28 DIAGNOSIS — R627 Adult failure to thrive: Secondary | ICD-10-CM | POA: Diagnosis not present

## 2021-08-28 DIAGNOSIS — R531 Weakness: Secondary | ICD-10-CM | POA: Diagnosis not present

## 2021-08-28 DIAGNOSIS — E871 Hypo-osmolality and hyponatremia: Secondary | ICD-10-CM | POA: Diagnosis not present

## 2021-08-28 DIAGNOSIS — E222 Syndrome of inappropriate secretion of antidiuretic hormone: Secondary | ICD-10-CM

## 2021-08-28 LAB — CBC
HCT: 30.8 % — ABNORMAL LOW (ref 36.0–46.0)
Hemoglobin: 9.1 g/dL — ABNORMAL LOW (ref 12.0–15.0)
MCH: 29.9 pg (ref 26.0–34.0)
MCHC: 29.5 g/dL — ABNORMAL LOW (ref 30.0–36.0)
MCV: 101.3 fL — ABNORMAL HIGH (ref 80.0–100.0)
Platelets: 101 10*3/uL — ABNORMAL LOW (ref 150–400)
RBC: 3.04 MIL/uL — ABNORMAL LOW (ref 3.87–5.11)
RDW: 17.9 % — ABNORMAL HIGH (ref 11.5–15.5)
WBC: 6 10*3/uL (ref 4.0–10.5)
nRBC: 3.3 % — ABNORMAL HIGH (ref 0.0–0.2)

## 2021-08-28 LAB — BASIC METABOLIC PANEL
Anion gap: 5 (ref 5–15)
BUN: 7 mg/dL — ABNORMAL LOW (ref 8–23)
CO2: 26 mmol/L (ref 22–32)
Calcium: 8.2 mg/dL — ABNORMAL LOW (ref 8.9–10.3)
Chloride: 94 mmol/L — ABNORMAL LOW (ref 98–111)
Creatinine, Ser: 0.58 mg/dL (ref 0.44–1.00)
GFR, Estimated: >60 mL/min (ref >60)
Glucose, Bld: 81 mg/dL (ref 70–99)
Potassium: 4.4 mmol/L (ref 3.5–5.1)
Sodium: 125 mmol/L — ABNORMAL LOW (ref 135–145)

## 2021-08-28 LAB — MAGNESIUM: Magnesium: 1.9 mg/dL (ref 1.7–2.4)

## 2021-08-28 MED ORDER — GLYCOPYRROLATE 0.2 MG/ML IJ SOLN
0.1000 mg | Freq: Once | INTRAMUSCULAR | Status: AC
  Administered 2021-08-28: 0.1 mg via INTRAVENOUS
  Filled 2021-08-28: qty 1

## 2021-08-28 MED ORDER — FUROSEMIDE 20 MG PO TABS: 20.0000 mg | ORAL_TABLET | ORAL | Status: DC

## 2021-08-28 MED ORDER — LEVOTHYROXINE SODIUM 100 MCG PO TABS: 100.0000 ug | ORAL_TABLET | Freq: Every day | ORAL | 1 refills | Status: DC

## 2021-08-28 MED ORDER — SODIUM CHLORIDE 1 G PO TABS: 1.0000 g | ORAL_TABLET | Freq: Two times a day (BID) | ORAL | 0 refills | Status: AC

## 2021-08-28 MED ORDER — ENSURE ENLIVE PO LIQD: 237.0000 mL | Freq: Two times a day (BID) | ORAL | 0 refills | Status: AC

## 2021-08-28 NOTE — Discharge Summary (Addendum)
Physician Discharge Summary  Lisa [name] ZJI:[id number] DOB: 1934-10-09 DOA: 08/26/2021  PCP: Celene Squibb, MD Oncologist: Dr. Delton Coombes  Admit date: 08/26/2021 Discharge date: 08/28/2021  Admitted From:  Home  Disposition:  Home with [facility]   Recommendations for Outpatient Follow-up:  Follow up with PCP in 1 weeks Follow up with oncologist in 1-2 weeks for recheck  [facility] RN to obtain BMP/Mg on 10/31 and report to PCP and oncology Please order outpatient palliative medicine consultation for goals of care discussions  Home Health:  PT, RN, Aide   Discharge Condition: STABLE   CODE STATUS: FULL DIET: regular    Brief Hospitalization Summary: Please see all hospital notes, images, labs for full details of the hospitalization. ADMISSION HPI: Lisa [name] is a 87 y.o. female with medical history significant of multiple medical issues including lung cancer, breast cancer, hypertension, neutropenia, MDS, presenting with hyponatremia and weakness.  Per report, patient has had about 3 falls today.  Patient has had worsening weakness over the past 1 to 2 days.  Patient noted to have been treated for UTI on October 13 with patient being started on Cipro.  Per the patient, she is at increased nausea and vomiting.  Per report, the family reports patient with overall decreased p.o. intake.  No no reported chest pain or shortness of breath.  No reported abdominal pain.  No reported nausea or diarrhea.  No reported head trauma associated with the fall. ED Course: Presented to the ER afebrile, hemodynamically stable.  Labs notable for sodium o urinalysis pending.  F 119, potassium 3.4, creatinine 0.74.  Count 2.9, hemoglobin 10.7.?  Right-sided weakness on evaluation in the ER by ER provider.  CT head as well as MRI of the brain negative for any acute changes.  HOSPITAL COURSE  Patient was admitted to the hospital after having several days of nausea and vomiting at home and was clinically dehydrated noted  to have an exacerbation of her chronic hyponatremia with a sodium down to 119.  Patient was treated with gentle IV fluid hydration with normal saline and sodium levels improved.  Patient responded favorably to supportive therapy and has started eating and drinking much better and tolerating diet with no nausea or vomiting or abdominal pain.  She was seen by physical therapy and they recommended home health services which have been arranged prior to discharge.  Patient reports feeling better and wants to go home today.  Her mentation is at baseline and she is awake and oriented x3.  She reports that she has close family members at home that are caring for her.  We are going to try sodium tablets taken orally twice daily and we have arranged for home health nurse to check her BMP on 10/31 and report the results to her PCP and oncologist.  Also plan to hold Lasix for several days given that she has been so dehydrated and having poor oral intake over the past several days prior to admission.  I am recommending that she get an outpatient palliative medicine consultation for goals of care discussions.  It patient is unable to eat and drink to maintain her basic metabolic needs would strongly advocate for palliative consultation for full comfort measures.  Discharge Diagnoses:  Principal Problem:   Hyponatremia Active Problems:   Normocytic anemia   Abdominal aortic aneurysm   Urinary incontinence   Acute renal failure superimposed on stage 3b chronic kidney disease (HCC)   Hypothyroidism   HTN (hypertension)   Weight loss  Generalized muscle weakness   Weakness   FTT (failure to thrive) in adult   Malnutrition of moderate degree   Malignant neoplasm of unspecified site of unspecified female breast ([place])   MDS (myelodysplastic syndrome) ([place])   Discharge Instructions: Discharge Instructions     Ambulatory referral to Hematology / Oncology   Complete by: As directed    Hospital follow up       Allergies as of 08/28/2021       Reactions   Meloxicam Other (See Comments)   Caused an injury to the kidneys, per nephrologist   Micardis Hct [telmisartan-hctz] Other (See Comments)   Caused an injury to the kidneys, per nephrologist        Medication List     STOP taking these medications    albuterol 90 MCG/ACT inhaler Commonly known as: PROVENTIL,VENTOLIN   ciprofloxacin 500 MG tablet Commonly known as: CIPRO   metoprolol succinate 50 MG 24 hr tablet Commonly known as: TOPROL-XL   OVER THE COUNTER MEDICATION   predniSONE 10 MG tablet Commonly known as: DELTASONE   traMADol 50 MG tablet Commonly known as: ULTRAM       TAKE these medications    acetaminophen 325 MG tablet Commonly known as: TYLENOL Take 2 tablets (650 mg total) by mouth every 6 (six) hours.   feeding supplement Liqd Take 237 mLs by mouth 2 (two) times daily between meals.   furosemide 20 MG tablet Commonly known as: LASIX Take 1 tablet (20 mg total) by mouth every other day. Start taking on: September 04, 2021 What changed:  when to take this These instructions start on September 04, 2021. If you are unsure what to do until then, ask your doctor or other care provider.   Fusion Plus Caps Take 1 capsule by mouth once a week.   Incruse Ellipta 62.5 MCG/ACT Aepb Generic drug: umeclidinium bromide Inhale into the lungs daily.   levothyroxine 100 MCG tablet Commonly known as: SYNTHROID Take 1 tablet (100 mcg total) by mouth daily before breakfast.   loratadine 10 MG tablet Commonly known as: CLARITIN Take 10 mg by mouth at bedtime as needed.   Magnesium 400 MG Tabs Take 400 mg by mouth daily.   Melatonin 5 MG Caps Take 1 capsule by mouth daily as needed (sleep).   mirtazapine 7.5 MG tablet Commonly known as: REMERON Take 1 tablet (7.5 mg total) by mouth at bedtime.   pantoprazole 40 MG tablet Commonly known as: PROTONIX Take 1 tablet (40 mg total) by mouth daily.    polyethylene glycol 17 g packet Commonly known as: MIRALAX / GLYCOLAX Take 17 g by mouth daily as needed for moderate constipation.   senna-docusate 8.6-50 MG tablet Commonly known as: Senokot-S Take 1 tablet by mouth at bedtime.   sodium chloride 1 g tablet Take 1 tablet (1 g total) by mouth 2 (two) times daily with a meal for 7 days.   Vitamin D (Ergocalciferol) 1.25 MG (50000 UNIT) Caps capsule Commonly known as: DRISDOL Take 50,000 Units by mouth once a week.        Follow-up Information     Derek Jack, MD. Schedule an appointment as soon as possible for a visit in 1 week(s).   Specialty: Hematology Why: Hospital Follow Up Contact information: [place] [address] [phone]         Celene Squibb, MD. Schedule an appointment as soon as possible for a visit in 1 week(s).   Specialty: Internal  Medicine Why: Hospital Follow Up Contact information: [place] [address] [phone]                Allergies  Allergen Reactions   Meloxicam Other (See Comments)    Caused an injury to the kidneys, per nephrologist   Micardis Hct [Telmisartan-Hctz] Other (See Comments)    Caused an injury to the kidneys, per nephrologist   Allergies as of 08/28/2021       Reactions   Meloxicam Other (See Comments)   Caused an injury to the kidneys, per nephrologist   Micardis Hct [telmisartan-hctz] Other (See Comments)   Caused an injury to the kidneys, per nephrologist        Medication List     STOP taking these medications    albuterol 90 MCG/ACT inhaler Commonly known as: PROVENTIL,VENTOLIN   ciprofloxacin 500 MG tablet Commonly known as: CIPRO   metoprolol succinate 50 MG 24 hr tablet Commonly known as: TOPROL-XL   OVER THE COUNTER MEDICATION   predniSONE 10 MG tablet Commonly known as: DELTASONE   traMADol 50 MG tablet Commonly known as: ULTRAM       TAKE these medications    acetaminophen 325  MG tablet Commonly known as: TYLENOL Take 2 tablets (650 mg total) by mouth every 6 (six) hours.   feeding supplement Liqd Take 237 mLs by mouth 2 (two) times daily between meals.   furosemide 20 MG tablet Commonly known as: LASIX Take 1 tablet (20 mg total) by mouth every other day. Start taking on: September 04, 2021 What changed:  when to take this These instructions start on September 04, 2021. If you are unsure what to do until then, ask your doctor or other care provider.   Fusion Plus Caps Take 1 capsule by mouth once a week.   Incruse Ellipta 62.5 MCG/ACT Aepb Generic drug: umeclidinium bromide Inhale into the lungs daily.   levothyroxine 100 MCG tablet Commonly known as: SYNTHROID Take 1 tablet (100 mcg total) by mouth daily before breakfast.   loratadine 10 MG tablet Commonly known as: CLARITIN Take 10 mg by mouth at bedtime as needed.   Magnesium 400 MG Tabs Take 400 mg by mouth daily.   Melatonin 5 MG Caps Take 1 capsule by mouth daily as needed (sleep).   mirtazapine 7.5 MG tablet Commonly known as: REMERON Take 1 tablet (7.5 mg total) by mouth at bedtime.   pantoprazole 40 MG tablet Commonly known as: PROTONIX Take 1 tablet (40 mg total) by mouth daily.   polyethylene glycol 17 g packet Commonly known as: MIRALAX / GLYCOLAX Take 17 g by mouth daily as needed for moderate constipation.   senna-docusate 8.6-50 MG tablet Commonly known as: Senokot-S Take 1 tablet by mouth at bedtime.   sodium chloride 1 g tablet Take 1 tablet (1 g total) by mouth 2 (two) times daily with a meal for 7 days.   Vitamin D (Ergocalciferol) 1.25 MG (50000 UNIT) Caps capsule Commonly known as: DRISDOL Take 50,000 Units by mouth once a week.        Procedures/Studies: CT Head Wo Contrast  Result Date: 08/26/2021 CLINICAL DATA:  TIA, headache and dizziness after falling at home at 3 a.m. this morning EXAM: CT HEAD WITHOUT CONTRAST TECHNIQUE: Contiguous axial images  were obtained from the base of the skull through the vertex without intravenous contrast. COMPARISON:  CT head 03/30/2021 FINDINGS: Brain: There is no acute intracranial hemorrhage, extra-axial fluid collection, or acute infarct.  There is mild parenchymal volume loss. The ventricles are not enlarged. Foci of hypodensity in the subcortical and periventricular white matter likely reflects sequela of chronic white matter microangiopathy. There is no mass lesion.  There is no midline shift. Vascular: There is calcification of the bilateral cavernous ICAs. Skull: Normal. Negative for fracture or focal lesion. Sinuses/Orbits: The imaged paranasal sinuses are clear. Bilateral lens implants are in place. The globes and orbits are otherwise unremarkable. Other: None. IMPRESSION: 1. No acute intracranial pathology. 2. Unchanged chronic white matter microangiopathy. Electronically Signed   By: Valetta Mole M.D.   On: 08/26/2021 15:37   MR BRAIN WO CONTRAST  Result Date: 08/26/2021 CLINICAL DATA:  Neuro deficit, acute, stroke suspected EXAM: MRI HEAD WITHOUT CONTRAST TECHNIQUE: Multiplanar, multiecho pulse sequences of the brain and surrounding structures were obtained without intravenous contrast. COMPARISON:  None. FINDINGS: Motion artifact is present on some sequences. Brain: There is no acute infarction or intracranial hemorrhage. There is no intracranial mass, mass effect, or edema. There is no hydrocephalus. There is a thin extra-axial collection along the posterior right cerebral convexity without mass effect. Prominence of the ventricles and sulci reflects mild parenchymal volume loss. Patchy and confluent T2 hyperintensity in the supratentorial and pontine white matter is nonspecific but may reflect moderate to advanced chronic microvascular ischemic changes. Small chronic left caudate infarct. Vascular: Major vessel flow voids at the skull base are preserved. Skull and upper cervical spine: Normal marrow signal  is preserved. Sinuses/Orbits: Paranasal sinuses are aerated. Bilateral lens replacements. Other: Sella is unremarkable.  Mastoid air cells are clear. IMPRESSION: No acute infarct. Moderate to advanced chronic microvascular ischemic changes. Trace posterior right cerebral convexity subdural collection. Probably non-acute. Electronically Signed   By: Macy Mis M.D.   On: 08/26/2021 17:03     Subjective: Patient reports that she feels well.  She is tolerating diet well no nausea or vomiting or diarrhea no abdominal pain.  Patient reports that she would like to go home.  She reports that the food is much better at home and she would likely eat better at home.  She reports that she is agreeable to receiving home health services.  She reports that she has good family support at home.  Discharge Exam: Vitals:   08/28/21 0752 08/28/21 1130  BP:    Pulse: 66   Resp: (!) 33 (!) 28  Temp: 98.9 F (37.2 C) 99.7 F (37.6 C)  SpO2: 97%    Vitals:   08/28/21 0000 08/28/21 0400 08/28/21 0752 08/28/21 1130  BP: (!) 110/51 (!) 113/56    Pulse: 77 84 66   Resp: (!) 25 19 (!) 33 (!) 28  Temp: 98.6 F (37 C) 99.1 F (37.3 C) 98.9 F (37.2 C) 99.7 F (37.6 C)  TempSrc:  Oral Oral Oral  SpO2: 98% 97% 97%   Weight:      Height:       General exam: elderly frail awake, alert, Appears calm and comfortable  Respiratory system: Clear to auscultation. Respiratory effort normal. Cardiovascular system: normal S1 & S2 heard. No JVD, murmurs, rubs, gallops or clicks. No pedal edema. Gastrointestinal system: Abdomen is nondistended, soft and nontender. No organomegaly or masses felt. Normal bowel sounds heard. Central nervous system: Alert and oriented. No focal neurological deficits. Extremities: Symmetric 5 x 5 power. Skin: No rashes, lesions or ulcers Psychiatry: Judgement and insight appear normal. Mood & affect appropriate.    The results of significant diagnostics from this hospitalization  (including  imaging, microbiology, ancillary and laboratory) are listed below for reference.     Microbiology: Recent Results (from the past 240 hour(s))  Resp Panel by RT-PCR (Flu A&B, Covid) Nasopharyngeal Swab     Status: None   Collection Time: 08/26/21  7:57 PM   Specimen: Nasopharyngeal Swab; Nasopharyngeal(NP) swabs in vial transport medium  Result Value Ref Range Status   SARS Coronavirus 2 by RT PCR NEGATIVE NEGATIVE Final    Comment: (NOTE) SARS-CoV-2 target nucleic acids are NOT DETECTED.  The SARS-CoV-2 RNA is generally detectable in upper respiratory specimens during the acute phase of infection. The lowest concentration of SARS-CoV-2 viral copies this assay can detect is 138 copies/mL. A negative result does not preclude SARS-Cov-2 infection and should not be used as the sole basis for treatment or other patient management decisions. A negative result may occur with  improper specimen collection/handling, submission of specimen other than nasopharyngeal swab, presence of viral mutation(s) within the areas targeted by this assay, and inadequate number of viral copies(<138 copies/mL). A negative result must be combined with clinical observations, patient history, and epidemiological information. The expected result is Negative.  Fact Sheet for Patients:  EntrepreneurPulse.com.au  Fact Sheet for Healthcare Providers:  IncredibleEmployment.be  This test is no t yet approved or cleared by the [place] FDA and  has been authorized for detection and/or diagnosis of SARS-CoV-2 by FDA under an Emergency Use Authorization (EUA). This EUA will remain  in effect (meaning this test can be used) for the duration of the COVID-19 declaration under Section 564(b)(1) of the Act, 21 U.S.C.section 360bbb-3(b)(1), unless the authorization is terminated  or revoked sooner.       Influenza A by PCR NEGATIVE NEGATIVE Final   Influenza B by PCR  NEGATIVE NEGATIVE Final    Comment: (NOTE) The Xpert Xpress SARS-CoV-2/FLU/RSV plus assay is intended as an aid in the diagnosis of influenza from Nasopharyngeal swab specimens and should not be used as a sole basis for treatment. Nasal washings and aspirates are unacceptable for Xpert Xpress SARS-CoV-2/FLU/RSV testing.  Fact Sheet for Patients: EntrepreneurPulse.com.au  Fact Sheet for Healthcare Providers: IncredibleEmployment.be  This test is not yet approved or cleared by the [place] FDA and has been authorized for detection and/or diagnosis of SARS-CoV-2 by FDA under an Emergency Use Authorization (EUA). This EUA will remain in effect (meaning this test can be used) for the duration of the COVID-19 declaration under Section 564(b)(1) of the Act, 21 U.S.C. section 360bbb-3(b)(1), unless the authorization is terminated or revoked.  Performed at [facility], [address] [place], [place] [address]   MRSA Next Gen by PCR, Nasal     Status: None   Collection Time: 08/27/21 12:51 AM   Specimen: Nasal Mucosa; Nasal Swab  Result Value Ref Range Status   MRSA by PCR Next Gen NOT DETECTED NOT DETECTED Final    Comment: (NOTE) The GeneXpert MRSA Assay (FDA approved for NASAL specimens only), is one component of a comprehensive MRSA colonization surveillance program. It is not intended to diagnose MRSA infection nor to guide or monitor treatment for MRSA infections. Test performance is not FDA approved in patients less than 52 years old. Performed at [facility], [address], [place], [place] [address]   Culture, blood (routine x 2)     Status: None (Preliminary result)   Collection Time: 08/27/21  9:56 PM   Specimen: BLOOD  Result Value Ref Range Status   Specimen Description BLOOD BLOOD LEFT HAND  Final  Special Requests   Final    BOTTLES DRAWN AEROBIC AND ANAEROBIC Blood Culture adequate volume   Culture   Final    NO GROWTH <  12 HOURS Performed at [facility], [address], [place], [place] [address]    Report Status PENDING  Incomplete  Culture, blood (routine x 2)     Status: None (Preliminary result)   Collection Time: 08/27/21  9:56 PM   Specimen: BLOOD  Result Value Ref Range Status   Specimen Description BLOOD BLOOD RIGHT ARM  Final   Special Requests   Final    BOTTLES DRAWN AEROBIC AND ANAEROBIC Blood Culture adequate volume   Culture   Final    NO GROWTH < 12 HOURS Performed at [facility], [address], [place], [place] [address]    Report Status PENDING  Incomplete     Labs: BNP (last 3 results) No results for input(s): BNP in the last 8760 hours. Basic Metabolic Panel: Recent Labs  Lab 08/22/21 1002 08/26/21 1252 08/27/21 0025 08/27/21 0440 08/28/21 0436  NA 134* 119* 124* 126* 125*  K 4.3 3.4* 3.4* 3.2* 4.4  CL 102 83* 90* 91* 94*  CO2 26 27 27 27 26   GLUCOSE 90 84 78 73 81  BUN 12 9 8  7* 7*  CREATININE 0.65 0.74 0.65 0.69 0.58  CALCIUM 8.6* 8.4* 8.0* 8.1* 8.2*  MG  --   --   --  1.4* 1.9   Liver Function Tests: Recent Labs  Lab 08/22/21 1002 08/26/21 1252 08/27/21 0025 08/27/21 0440  AST 29 33 29 28  ALT 15 14 14 13   ALKPHOS 57 47 43 42  BILITOT 0.6 0.5 0.8 0.5  PROT 8.9* 8.8* 7.8 7.5  ALBUMIN 2.8* 2.9* 2.6* 2.5*   Recent Labs  Lab 08/26/21 1252  LIPASE 45   No results for input(s): AMMONIA in the last 168 hours. CBC: Recent Labs  Lab 08/22/21 1002 08/26/21 1252 08/27/21 0440 08/28/21 0722  WBC 2.8* 2.9* 3.7* 6.0  NEUTROABS 0.3*  --  0.3*  --   HGB 9.4* 10.7* 9.4* 9.1*  HCT 33.2* 35.0* 31.5* 30.8*  MCV 104.1* 94.3 98.7 101.3*  PLT 45* 84* 89* 101*   Cardiac Enzymes: No results for input(s): CKTOTAL, CKMB, CKMBINDEX, TROPONINI in the last 168 hours. BNP: Invalid input(s): POCBNP CBG: No results for input(s): GLUCAP in the last 168 hours. D-Dimer No results for input(s): DDIMER in the last 72 hours. Hgb A1c No results for input(s): HGBA1C in  the last 72 hours. Lipid Profile No results for input(s): CHOL, HDL, LDLCALC, TRIG, CHOLHDL, LDLDIRECT in the last 72 hours. Thyroid function studies No results for input(s): TSH, T4TOTAL, T3FREE, THYROIDAB in the last 72 hours.  Invalid input(s): FREET3 Anemia work up No results for input(s): VITAMINB12, FOLATE, FERRITIN, TIBC, IRON, RETICCTPCT in the last 72 hours. Urinalysis    Component Value Date/Time   COLORURINE YELLOW 08/26/2021 1843   APPEARANCEUR CLEAR 08/26/2021 1843   LABSPEC 1.005 08/26/2021 1843   PHURINE 7.0 08/26/2021 1843   GLUCOSEU NEGATIVE 08/26/2021 1843   HGBUR NEGATIVE 08/26/2021 1843   BILIRUBINUR NEGATIVE 08/26/2021 1843   KETONESUR NEGATIVE 08/26/2021 1843   PROTEINUR NEGATIVE 08/26/2021 1843   NITRITE NEGATIVE 08/26/2021 1843   LEUKOCYTESUR NEGATIVE 08/26/2021 1843   Sepsis Labs Invalid input(s): PROCALCITONIN,  WBC,  LACTICIDVEN Microbiology Recent Results (from the past 240 hour(s))  Resp Panel by RT-PCR (Flu A&B, Covid) Nasopharyngeal Swab     Status: None  Collection Time: 08/26/21  7:57 PM   Specimen: Nasopharyngeal Swab; Nasopharyngeal(NP) swabs in vial transport medium  Result Value Ref Range Status   SARS Coronavirus 2 by RT PCR NEGATIVE NEGATIVE Final    Comment: (NOTE) SARS-CoV-2 target nucleic acids are NOT DETECTED.  The SARS-CoV-2 RNA is generally detectable in upper respiratory specimens during the acute phase of infection. The lowest concentration of SARS-CoV-2 viral copies this assay can detect is 138 copies/mL. A negative result does not preclude SARS-Cov-2 infection and should not be used as the sole basis for treatment or other patient management decisions. A negative result may occur with  improper specimen collection/handling, submission of specimen other than nasopharyngeal swab, presence of viral mutation(s) within the areas targeted by this assay, and inadequate number of viral copies(<138 copies/mL). A negative result  must be combined with clinical observations, patient history, and epidemiological information. The expected result is Negative.  Fact Sheet for Patients:  EntrepreneurPulse.com.au  Fact Sheet for Healthcare Providers:  IncredibleEmployment.be  This test is no t yet approved or cleared by the [place] FDA and  has been authorized for detection and/or diagnosis of SARS-CoV-2 by FDA under an Emergency Use Authorization (EUA). This EUA will remain  in effect (meaning this test can be used) for the duration of the COVID-19 declaration under Section 564(b)(1) of the Act, 21 U.S.C.section 360bbb-3(b)(1), unless the authorization is terminated  or revoked sooner.       Influenza A by PCR NEGATIVE NEGATIVE Final   Influenza B by PCR NEGATIVE NEGATIVE Final    Comment: (NOTE) The Xpert Xpress SARS-CoV-2/FLU/RSV plus assay is intended as an aid in the diagnosis of influenza from Nasopharyngeal swab specimens and should not be used as a sole basis for treatment. Nasal washings and aspirates are unacceptable for Xpert Xpress SARS-CoV-2/FLU/RSV testing.  Fact Sheet for Patients: EntrepreneurPulse.com.au  Fact Sheet for Healthcare Providers: IncredibleEmployment.be  This test is not yet approved or cleared by the [place] FDA and has been authorized for detection and/or diagnosis of SARS-CoV-2 by FDA under an Emergency Use Authorization (EUA). This EUA will remain in effect (meaning this test can be used) for the duration of the COVID-19 declaration under Section 564(b)(1) of the Act, 21 U.S.C. section 360bbb-3(b)(1), unless the authorization is terminated or revoked.  Performed at [facility], [address] [place], [place] [address]   MRSA Next Gen by PCR, Nasal     Status: None   Collection Time: 08/27/21 12:51 AM   Specimen: Nasal Mucosa; Nasal Swab  Result Value Ref Range Status   MRSA by PCR Next  Gen NOT DETECTED NOT DETECTED Final    Comment: (NOTE) The GeneXpert MRSA Assay (FDA approved for NASAL specimens only), is one component of a comprehensive MRSA colonization surveillance program. It is not intended to diagnose MRSA infection nor to guide or monitor treatment for MRSA infections. Test performance is not FDA approved in patients less than 63 years old. Performed at [facility], [address], [place],  [address]   Culture, blood (routine x 2)     Status: None (Preliminary result)   Collection Time: 08/27/21  9:56 PM   Specimen: BLOOD  Result Value Ref Range Status   Specimen Description BLOOD BLOOD LEFT HAND  Final   Special Requests   Final    BOTTLES DRAWN AEROBIC AND ANAEROBIC Blood Culture adequate volume   Culture   Final    NO GROWTH < 12 HOURS Performed at [facility], [address], [place], [place] [address]    Report Status PENDING  Incomplete  Culture, blood (routine x 2)     Status: None (Preliminary result)   Collection Time: 08/27/21  9:56 PM   Specimen: BLOOD  Result Value Ref Range Status   Specimen Description BLOOD BLOOD RIGHT ARM  Final   Special Requests   Final    BOTTLES DRAWN AEROBIC AND ANAEROBIC Blood Culture adequate volume   Culture   Final    NO GROWTH < 12 HOURS Performed at [facility], [address] [place], [place] [address]    Report Status PENDING  Incomplete   Time coordinating discharge: 38 minutes   SIGNED:  Irwin Brakeman, MD  [facility] 08/28/2021, 12:52 PM How to contact the [facility] Attending or Consulting provider [place] or covering provider during after hours [place], for this patient?  Check the care team in [facility] and look for a) attending/consulting TRH provider listed and b) the [facility] team listed Log into [email or web address] and use [place]'s universal password to access. If you do not have the password, please contact the hospital operator. Locate the [facility] provider you are looking for under [facility] and page to a number that you can be directly reached. If you still have difficulty reaching the provider, please page the [facility] (Director on Call) for the Hospitalists listed on amion for assistance.

## 2021-08-28 NOTE — Discharge Instructions (Signed)
Home health RN to check BMP, Mg on 10/31 and report to PCP and oncologist  IMPORTANT INFORMATION: PAY CLOSE ATTENTION   PHYSICIAN DISCHARGE INSTRUCTIONS  Follow with Primary care provider  Celene Squibb, MD  and other consultants as instructed by your Hospitalist Physician  [place], WORSEN OR NEW PROBLEM DEVELOPS   Please note: You were cared for by a hospitalist during your hospital stay. Every effort will be made to forward records to your primary care provider.  You can request that your primary care provider send for your hospital records if they have not received them.  Once you are discharged, your primary care physician will handle any further medical issues. Please note that NO REFILLS for any discharge medications will be authorized once you are discharged, as it is imperative that you return to your primary care physician (or establish a relationship with a primary care physician if you do not have one) for your post hospital discharge needs so that they can reassess your need for medications and monitor your lab values.  Please get a complete blood count and chemistry panel checked by your Primary MD at your next visit, and again as instructed by your Primary MD.  Get Medicines reviewed and adjusted: Please take all your medications with you for your next visit with your Primary MD  Laboratory/radiological data: Please request your Primary MD to go over all hospital tests and procedure/radiological results at the follow up, please ask your primary care provider to get all Hospital records sent to his/her office.  In some cases, they will be blood work, cultures and biopsy results pending at the time of your discharge. Please request that your primary care provider follow up on these results.  If you are diabetic, please bring your blood sugar readings with you to your follow up appointment with primary care.    Please call  and make your follow up appointments as soon as possible.    Also Note the following: If you experience worsening of your admission symptoms, develop shortness of breath, life threatening emergency, suicidal or homicidal thoughts you must seek medical attention immediately by calling 911 or calling your MD immediately  if symptoms less severe.  You must read complete instructions/literature along with all the possible adverse reactions/side effects for all the Medicines you take and that have been prescribed to you. Take any new Medicines after you have completely understood and accpet all the possible adverse reactions/side effects.   Do not drive when taking Pain medications or sleeping medications (Benzodiazepines)  Do not take more than prescribed Pain, Sleep and Anxiety Medications. It is not advisable to combine anxiety,sleep and pain medications without talking with your primary care practitioner  Special Instructions: If you have smoked or chewed Tobacco  in the last 2 yrs please stop smoking, stop any regular Alcohol  and or any Recreational drug use.  Wear Seat belts while driving.  Do not drive if taking any narcotic, mind altering or controlled substances or recreational drugs or alcohol.

## 2021-08-28 NOTE — TOC Transition Note (Addendum)
Transition of Care [facility]) - CM/SW Discharge Note   Patient Details  Name: Lisa [name] MRN: [id number] Date of Birth: 1934/10/11  [facility] CM/SW Contact:  Shade Flood, LCSW Phone Number: 08/28/2021, 1:07 PM   Clinical Narrative:     Pt stable for dc today per MD. [place] orders entered. Spoke with pt and her daughter and grandson to review dc planning. Pt will be returning home at dc. Family is supportive and involved. Provided CMS provider options list of [facility] agencies for pt and family to select [facility]. Referral sent to [facility] at family request. Pt states she has all necessary DME at home. No other TOC needs identified for dc.  [place] orders entered for RN and PT. Asked MD to add RN aide at family request.  Final next level of care: Home w Home Health Services Barriers to Discharge: Barriers Resolved   Patient Goals and CMS Choice Patient states their goals for this hospitalization and ongoing recovery are:: go home CMS Medicare.gov Compare Post Acute Care list provided to:: Patient Represenative (must comment) Choice offered to / list presented to : Adult Children  Discharge Placement                       Discharge Plan and Services In-house Referral: Clinical Social Work   Post Acute Care Choice: Home Health                    HH Arranged: RN, PT, Nurse's Aide [place] Agency: [place] Date [facility] Agency Contacted: 08/28/21   Representative spoke with at [place]: [place] ([place]) Interventions     Readmission Risk Interventions Readmission Risk Prevention Plan 08/28/2021  Transportation Screening Complete  HRI or [place] Complete  Social Work Consult for [place] Planning/Counseling Complete  Palliative Care Screening Not Applicable  Medication Review Press photographer) Complete  Some recent data might be hidden

## 2021-08-29 ENCOUNTER — Encounter (HOSPITAL_COMMUNITY): Payer: Self-pay

## 2021-08-29 DIAGNOSIS — J189 Pneumonia, unspecified organism: Secondary | ICD-10-CM | POA: Diagnosis not present

## 2021-08-29 DIAGNOSIS — R6520 Severe sepsis without septic shock: Secondary | ICD-10-CM | POA: Diagnosis not present

## 2021-08-29 DIAGNOSIS — G928 Other toxic encephalopathy: Secondary | ICD-10-CM | POA: Diagnosis not present

## 2021-08-29 DIAGNOSIS — Z79899 Other long term (current) drug therapy: Secondary | ICD-10-CM | POA: Diagnosis not present

## 2021-08-29 DIAGNOSIS — E222 Syndrome of inappropriate secretion of antidiuretic hormone: Secondary | ICD-10-CM | POA: Diagnosis not present

## 2021-08-29 DIAGNOSIS — Z9981 Dependence on supplemental oxygen: Secondary | ICD-10-CM | POA: Diagnosis not present

## 2021-08-29 DIAGNOSIS — J440 Chronic obstructive pulmonary disease with acute lower respiratory infection: Secondary | ICD-10-CM | POA: Diagnosis not present

## 2021-08-29 DIAGNOSIS — R4182 Altered mental status, unspecified: Secondary | ICD-10-CM | POA: Diagnosis not present

## 2021-08-29 DIAGNOSIS — D709 Neutropenia, unspecified: Secondary | ICD-10-CM | POA: Diagnosis not present

## 2021-08-29 DIAGNOSIS — R0602 Shortness of breath: Secondary | ICD-10-CM | POA: Diagnosis not present

## 2021-08-29 DIAGNOSIS — G629 Polyneuropathy, unspecified: Secondary | ICD-10-CM | POA: Diagnosis not present

## 2021-08-29 DIAGNOSIS — Z9189 Other specified personal risk factors, not elsewhere classified: Secondary | ICD-10-CM | POA: Diagnosis not present

## 2021-08-29 DIAGNOSIS — I714 Abdominal aortic aneurysm, without rupture, unspecified: Secondary | ICD-10-CM | POA: Diagnosis not present

## 2021-08-29 DIAGNOSIS — R531 Weakness: Secondary | ICD-10-CM | POA: Diagnosis not present

## 2021-08-29 DIAGNOSIS — D696 Thrombocytopenia, unspecified: Secondary | ICD-10-CM | POA: Diagnosis not present

## 2021-08-29 DIAGNOSIS — Z85118 Personal history of other malignant neoplasm of bronchus and lung: Secondary | ICD-10-CM | POA: Diagnosis not present

## 2021-08-29 DIAGNOSIS — E639 Nutritional deficiency, unspecified: Secondary | ICD-10-CM | POA: Diagnosis not present

## 2021-08-29 DIAGNOSIS — R634 Abnormal weight loss: Secondary | ICD-10-CM | POA: Diagnosis not present

## 2021-08-29 DIAGNOSIS — Y95 Nosocomial condition: Secondary | ICD-10-CM | POA: Diagnosis not present

## 2021-08-29 DIAGNOSIS — D61818 Other pancytopenia: Secondary | ICD-10-CM | POA: Diagnosis not present

## 2021-08-29 DIAGNOSIS — R069 Unspecified abnormalities of breathing: Secondary | ICD-10-CM | POA: Diagnosis not present

## 2021-08-29 DIAGNOSIS — E46 Unspecified protein-calorie malnutrition: Secondary | ICD-10-CM | POA: Diagnosis not present

## 2021-08-29 DIAGNOSIS — E039 Hypothyroidism, unspecified: Secondary | ICD-10-CM | POA: Diagnosis not present

## 2021-08-29 DIAGNOSIS — E871 Hypo-osmolality and hyponatremia: Secondary | ICD-10-CM | POA: Diagnosis not present

## 2021-08-29 DIAGNOSIS — C9310 Chronic myelomonocytic leukemia not having achieved remission: Secondary | ICD-10-CM | POA: Diagnosis not present

## 2021-08-29 DIAGNOSIS — R0902 Hypoxemia: Secondary | ICD-10-CM | POA: Diagnosis not present

## 2021-08-29 DIAGNOSIS — R41841 Cognitive communication deficit: Secondary | ICD-10-CM | POA: Diagnosis not present

## 2021-08-29 DIAGNOSIS — J9621 Acute and chronic respiratory failure with hypoxia: Secondary | ICD-10-CM | POA: Diagnosis not present

## 2021-08-29 DIAGNOSIS — E440 Moderate protein-calorie malnutrition: Secondary | ICD-10-CM | POA: Diagnosis not present

## 2021-08-29 DIAGNOSIS — R350 Frequency of micturition: Secondary | ICD-10-CM | POA: Diagnosis not present

## 2021-08-29 DIAGNOSIS — J9611 Chronic respiratory failure with hypoxia: Secondary | ICD-10-CM | POA: Diagnosis not present

## 2021-08-29 DIAGNOSIS — E860 Dehydration: Secondary | ICD-10-CM | POA: Diagnosis not present

## 2021-08-29 DIAGNOSIS — I10 Essential (primary) hypertension: Secondary | ICD-10-CM | POA: Diagnosis not present

## 2021-08-29 DIAGNOSIS — Z853 Personal history of malignant neoplasm of breast: Secondary | ICD-10-CM | POA: Diagnosis not present

## 2021-08-29 DIAGNOSIS — A4189 Other specified sepsis: Secondary | ICD-10-CM | POA: Diagnosis not present

## 2021-08-29 DIAGNOSIS — I959 Hypotension, unspecified: Secondary | ICD-10-CM | POA: Diagnosis not present

## 2021-08-29 DIAGNOSIS — J9601 Acute respiratory failure with hypoxia: Secondary | ICD-10-CM | POA: Diagnosis not present

## 2021-08-29 DIAGNOSIS — Z743 Need for continuous supervision: Secondary | ICD-10-CM | POA: Diagnosis not present

## 2021-08-29 DIAGNOSIS — J449 Chronic obstructive pulmonary disease, unspecified: Secondary | ICD-10-CM | POA: Diagnosis not present

## 2021-08-29 DIAGNOSIS — J9612 Chronic respiratory failure with hypercapnia: Secondary | ICD-10-CM | POA: Diagnosis not present

## 2021-08-29 DIAGNOSIS — R1312 Dysphagia, oropharyngeal phase: Secondary | ICD-10-CM | POA: Diagnosis not present

## 2021-08-29 DIAGNOSIS — Z20822 Contact with and (suspected) exposure to covid-19: Secondary | ICD-10-CM | POA: Diagnosis not present

## 2021-09-01 ENCOUNTER — Other Ambulatory Visit (HOSPITAL_COMMUNITY): Payer: Self-pay | Admitting: Hematology

## 2021-09-01 LAB — CULTURE, BLOOD (ROUTINE X 2)
Culture: NO GROWTH
Culture: NO GROWTH
Special Requests: ADEQUATE
Special Requests: ADEQUATE

## 2021-09-01 MED ORDER — DRONABINOL 2.5 MG PO CAPS: 2.5000 mg | ORAL_CAPSULE | Freq: Two times a day (BID) | ORAL | 0 refills | Status: DC

## 2021-09-02 ENCOUNTER — Other Ambulatory Visit (HOSPITAL_COMMUNITY): Payer: Self-pay

## 2021-09-02 NOTE — Progress Notes (Shared)
Lisa [name],  [id number]   CLINIC:  Medical Oncology/Hematology  PCP:  Lisa Squibb, MD [address] [place] [address] [phone]   REASON FOR VISIT:  Follow-up for ***  PRIOR THERAPY: ***  NGS Results: ***  CURRENT THERAPY: ***  BRIEF ONCOLOGIC HISTORY:  Oncology History Overview Note  Called Dr Juel Burrow office again now, office closed. Pt will see Dr Delton Coombes and I will ask him if he can talk to Dr Nevada Crane after his visit.  Thanks,   Lisa [name] of left lung ([place])  06/23/2016 Imaging   CT chest- Stable exam.  No new or progressive findings. Previously described tiny perifissural nodules at the base of the right major fissure are stable and likely represent subpleural lymph nodes.     CANCER STAGING: Cancer Staging Invasive ductal carcinoma of left breast [facility]) Staging form: Breast, AJCC 7th Edition - Pathologic stage from 07/03/1998: Stage IA (T1a, N0, cM0) - Signed by Baird Cancer, PA-C on 06/09/2016   INTERVAL HISTORY:  [name], a 87 y.o. female, returns for routine follow-up of her ***. Ming was last seen on {XX/XX/XXXX}.    REVIEW OF SYSTEMS:  Review of Systems - Oncology  PAST MEDICAL/SURGICAL HISTORY:  Past Medical History:  Diagnosis Date   Lisa [name] of left lung ([place]) 2006   Arthritis    Asthma    Cancer of breast, female ([place])    Cancer of lung ([place])    Hypertension    Hypothyroidism    Invasive ductal carcinoma of left breast ([place]) 1999   Neutropenia ([place]) 06/09/2016   Personal history of radiation therapy    Past Surgical History:  Procedure Laterality Date   BIOPSY  10/23/2020   Procedure: BIOPSY;  Surgeon: Yetta Flock, MD;  Location: [facility];  Service: Gastroenterology;;   BREAST BIOPSY     left axillary node dissection   BREAST LUMPECTOMY Left    COLONOSCOPY  03/08/2003   HUT:MLYYTKPTWS rectal polyps destroyed with the tip of the snare/Polyps at  hepatic flexure, splenic flexure at 10 cm/Left-sided diverticula: unable to retrieve path   COLONOSCOPY  09/12/2008   FKC:LEXNTZ rectum and distal sigmoid diminutive polyps/scattered left sided diverticulum. hyperplastic   COLONOSCOPY N/A 03/06/2013   GYF:VCBSWHQ polyp-removed as described above; colonic diverticulosis. hyperplastic polyps. next TCS 03/2018   ESOPHAGOGASTRODUODENOSCOPY (EGD) WITH PROPOFOL N/A 10/23/2020   Procedure: ESOPHAGOGASTRODUODENOSCOPY (EGD) WITH PROPOFOL;  Surgeon: Yetta Flock, MD;  Location: [place];  Service: Gastroenterology;  Laterality: N/A;   FOOT SURGERY     LUNG REMOVAL, PARTIAL     upper lobe    SOCIAL HISTORY:  Social History   Socioeconomic History   Marital status: Widowed    Spouse name: Not on file   Number of children: Not on file   Years of education: Not on file   Highest education level: Not on file  Occupational History   Not on file  Tobacco Use   Smoking status: Former    Packs/day: 0.75    Years: 35.00    Pack years: 26.25    Types: Cigarettes    Quit date: 62    Years since quitting: 29.8   Smokeless tobacco: Never   Tobacco comments:    smoke-free X 30 yeras  Vaping Use   Vaping Use: Never used  Substance and Sexual Activity   Alcohol use: No   Drug use: No   Sexual activity: Not Currently  Other Topics Concern   Not on file  Social History Narrative   Not on file   Social Determinants of Health   Financial Resource Strain: Not on file  Food Insecurity: Not on file  Transportation Needs: No Transportation Needs   Lack of Transportation (Medical): No   Lack of Transportation (Non-Medical): No  Physical Activity: Inactive   Days of Exercise per Week: 0 days   Minutes of Exercise per Session: 0 min  Stress: Not on file  Social Connections: Not on file  Intimate Partner Violence: Not At Risk   Fear of Current or Ex-Partner: No   Emotionally Abused: No   Physically Abused: No   Sexually Abused: No     FAMILY HISTORY:  Family History  Problem Relation Age of Onset   Cancer Mother    Cancer Sister    Colon cancer Neg Hx     CURRENT MEDICATIONS:  Current Outpatient Medications  Medication Sig Dispense Refill   acetaminophen (TYLENOL) 325 MG tablet Take 2 tablets (650 mg total) by mouth every 6 (six) hours.     dronabinol (MARINOL) 2.5 MG capsule Take 1 capsule (2.5 mg total) by mouth 2 (two) times daily before a meal. 60 capsule 0   feeding supplement (ENSURE ENLIVE / ENSURE PLUS) LIQD Take 237 mLs by mouth 2 (two) times daily between meals. 14220 mL 0   [START ON 09/04/2021] furosemide (LASIX) 20 MG tablet Take 1 tablet (20 mg total) by mouth every other day. 30 tablet    Iron-FA-B Cmp-C-Biot-Probiotic (FUSION PLUS) CAPS Take 1 capsule by mouth once a week.     levothyroxine (SYNTHROID) 100 MCG tablet Take 1 tablet (100 mcg total) by mouth daily before breakfast. 30 tablet 1   loratadine (CLARITIN) 10 MG tablet Take 10 mg by mouth at bedtime as needed.     Magnesium 400 MG TABS Take 400 mg by mouth daily.     Melatonin 5 MG CAPS Take 1 capsule by mouth daily as needed (sleep).     mirtazapine (REMERON) 7.5 MG tablet Take 1 tablet (7.5 mg total) by mouth at bedtime. 30 tablet 2   pantoprazole (PROTONIX) 40 MG tablet Take 1 tablet (40 mg total) by mouth daily. 30 tablet 0   polyethylene glycol (MIRALAX / GLYCOLAX) 17 g packet Take 17 g by mouth daily as needed for moderate constipation. 14 each 0   senna-docusate (SENOKOT-S) 8.6-50 MG tablet Take 1 tablet by mouth at bedtime.     sodium chloride 1 g tablet Take 1 tablet (1 g total) by mouth 2 (two) times daily with a meal for 7 days. 14 tablet 0   umeclidinium bromide (INCRUSE ELLIPTA) 62.5 MCG/INH AEPB Inhale into the lungs daily.     Vitamin D, Ergocalciferol, (DRISDOL) 1.25 MG (50000 UNIT) CAPS capsule Take 50,000 Units by mouth once a week.     No current facility-administered medications for this visit.    ALLERGIES:   Allergies  Allergen Reactions   Meloxicam Other (See Comments)    Caused an injury to the kidneys, per nephrologist   Micardis Hct [Telmisartan-Hctz] Other (See Comments)    Caused an injury to the kidneys, per nephrologist    PHYSICAL EXAM:  Performance status (ECOG): {CHL ONC DX:[id number]}  There were no vitals filed for this visit. Wt Readings from Last 3 Encounters:  08/27/21 130 lb 4.7 oz (59.1 kg)  06/25/21 136 lb 0.4 oz (61.7 kg)  05/28/21 141 lb 3.2 oz (64 kg)  Physical Exam   LABORATORY DATA:  I have reviewed the labs as listed.  CBC Latest Ref Rng & Units 08/28/2021 08/27/2021 08/26/2021  WBC 4.0 - 10.5 K/uL 6.0 3.7(L) 2.9(L)  Hemoglobin 12.0 - 15.0 g/dL 9.1(L) 9.4(L) 10.7(L)  Hematocrit 36.0 - 46.0 % 30.8(L) 31.5(L) 35.0(L)  Platelets 150 - 400 K/uL 101(L) 89(L) 84(L)   CMP Latest Ref Rng & Units 08/28/2021 08/27/2021 08/27/2021  Glucose 70 - 99 mg/dL 81 73 78  BUN 8 - 23 mg/dL 7(L) 7(L) 8  Creatinine 0.44 - 1.00 mg/dL 0.58 0.69 0.65  Sodium 135 - 145 mmol/L 125(L) 126(L) 124(L)  Potassium 3.5 - 5.1 mmol/L 4.4 3.2(L) 3.4(L)  Chloride 98 - 111 mmol/L 94(L) 91(L) 90(L)  CO2 22 - 32 mmol/L 26 27 27   Calcium 8.9 - 10.3 mg/dL 8.2(L) 8.1(L) 8.0(L)  Total Protein 6.5 - 8.1 g/dL - 7.5 7.8  Total Bilirubin 0.3 - 1.2 mg/dL - 0.5 0.8  Alkaline Phos 38 - 126 U/L - 42 43  AST 15 - 41 U/L - 28 29  ALT 0 - 44 U/L - 13 14    DIAGNOSTIC IMAGING:  I have independently reviewed the scans and discussed with the patient. CT Head Wo Contrast  Result Date: 08/26/2021 CLINICAL DATA:  TIA, headache and dizziness after falling at home at 3 a.m. this morning EXAM: CT HEAD WITHOUT CONTRAST TECHNIQUE: Contiguous axial images were obtained from the base of the skull through the vertex without intravenous contrast. COMPARISON:  CT head 03/30/2021 FINDINGS: Brain: There is no acute intracranial hemorrhage, extra-axial fluid collection, or acute infarct. There is mild parenchymal volume  loss. The ventricles are not enlarged. Foci of hypodensity in the subcortical and periventricular white matter likely reflects sequela of chronic white matter microangiopathy. There is no mass lesion.  There is no midline shift. Vascular: There is calcification of the bilateral cavernous ICAs. Skull: Normal. Negative for fracture or focal lesion. Sinuses/Orbits: The imaged paranasal sinuses are clear. Bilateral lens implants are in place. The globes and orbits are otherwise unremarkable. Other: None. IMPRESSION: 1. No acute intracranial pathology. 2. Unchanged chronic white matter microangiopathy. Electronically Signed   By: Valetta Mole M.D.   On: 08/26/2021 15:37   MR BRAIN WO CONTRAST  Result Date: 08/26/2021 CLINICAL DATA:  Neuro deficit, acute, stroke suspected EXAM: MRI HEAD WITHOUT CONTRAST TECHNIQUE: Multiplanar, multiecho pulse sequences of the brain and surrounding structures were obtained without intravenous contrast. COMPARISON:  None. FINDINGS: Motion artifact is present on some sequences. Brain: There is no acute infarction or intracranial hemorrhage. There is no intracranial mass, mass effect, or edema. There is no hydrocephalus. There is a thin extra-axial collection along the posterior right cerebral convexity without mass effect. Prominence of the ventricles and sulci reflects mild parenchymal volume loss. Patchy and confluent T2 hyperintensity in the supratentorial and pontine white matter is nonspecific but may reflect moderate to advanced chronic microvascular ischemic changes. Small chronic left caudate infarct. Vascular: Major vessel flow voids at the skull base are preserved. Skull and upper cervical spine: Normal marrow signal is preserved. Sinuses/Orbits: Paranasal sinuses are aerated. Bilateral lens replacements. Other: Sella is unremarkable.  Mastoid air cells are clear. IMPRESSION: No acute infarct. Moderate to advanced chronic microvascular ischemic changes. Trace posterior right  cerebral convexity subdural collection. Probably non-acute. Electronically Signed   By: Macy Mis M.D.   On: 08/26/2021 17:03   DG CHEST PORT 1 VIEW  Result Date: 08/28/2021 CLINICAL DATA:  Fever, weakness EXAM: PORTABLE CHEST  1 VIEW COMPARISON:  03/30/2021 FINDINGS: Stable cardiomegaly. Aortic atherosclerosis. Left perihilar and left axillary surgical clips. Hyperinflated lungs with chronically coarsened interstitial markings. Bibasilar scarring or atelectasis. No large pleural fluid collection. No pneumothorax. IMPRESSION: 1. Cardiomegaly. 2. COPD changes with bibasilar scarring or atelectasis. Electronically Signed   By: Davina Poke D.O.   On: 08/28/2021 13:17     ASSESSMENT:  ***   PLAN:  ***   Orders placed this encounter:  No orders of the defined types were placed in this encounter.    Derek Jack, MD [facility] [phone]   I, ***, am acting as a scribe for Dr. Derek Jack.  {Add Barista Statement}

## 2021-09-03 ENCOUNTER — Ambulatory Visit (HOSPITAL_COMMUNITY): Payer: TRICARE For Life (TFL) | Admitting: Hematology

## 2021-09-03 ENCOUNTER — Ambulatory Visit: Payer: Self-pay | Admitting: *Deleted

## 2021-09-03 ENCOUNTER — Inpatient Hospital Stay (HOSPITAL_COMMUNITY): Payer: Medicare Other

## 2021-09-03 ENCOUNTER — Other Ambulatory Visit: Payer: Self-pay | Admitting: *Deleted

## 2021-09-03 NOTE — Patient Outreach (Signed)
[facility]) Care Management  09/03/2021  Lisa [name] Jan 23, 1934 [id number]   Referral Date:  10/28 Referral Source: Data Analysis Referral Reason: Recent hospital discharge Insurance: Tricare   Member scheduled for initial outreach today, noted that she was readmitted to hospital ([place]) on 10/28, 1 day after discharged from [facility].  Plan: RN CM will follow up within the next 3-5 business days, once member discharged from [place], RN, MSN [place] [phone]

## 2021-09-04 DIAGNOSIS — I959 Hypotension, unspecified: Secondary | ICD-10-CM | POA: Diagnosis not present

## 2021-09-04 DIAGNOSIS — J189 Pneumonia, unspecified organism: Secondary | ICD-10-CM | POA: Diagnosis not present

## 2021-09-04 DIAGNOSIS — Z9189 Other specified personal risk factors, not elsewhere classified: Secondary | ICD-10-CM | POA: Diagnosis not present

## 2021-09-04 DIAGNOSIS — D708 Other neutropenia: Secondary | ICD-10-CM | POA: Diagnosis not present

## 2021-09-04 DIAGNOSIS — E034 Atrophy of thyroid (acquired): Secondary | ICD-10-CM | POA: Diagnosis not present

## 2021-09-04 DIAGNOSIS — R634 Abnormal weight loss: Secondary | ICD-10-CM | POA: Diagnosis not present

## 2021-09-04 DIAGNOSIS — G928 Other toxic encephalopathy: Secondary | ICD-10-CM | POA: Diagnosis not present

## 2021-09-04 DIAGNOSIS — Z853 Personal history of malignant neoplasm of breast: Secondary | ICD-10-CM | POA: Diagnosis not present

## 2021-09-04 DIAGNOSIS — S0083XA Contusion of other part of head, initial encounter: Secondary | ICD-10-CM | POA: Diagnosis not present

## 2021-09-04 DIAGNOSIS — Z743 Need for continuous supervision: Secondary | ICD-10-CM | POA: Diagnosis not present

## 2021-09-04 DIAGNOSIS — R0902 Hypoxemia: Secondary | ICD-10-CM | POA: Diagnosis not present

## 2021-09-04 DIAGNOSIS — Y998 Other external cause status: Secondary | ICD-10-CM | POA: Diagnosis not present

## 2021-09-04 DIAGNOSIS — J159 Unspecified bacterial pneumonia: Secondary | ICD-10-CM | POA: Diagnosis not present

## 2021-09-04 DIAGNOSIS — R062 Wheezing: Secondary | ICD-10-CM | POA: Diagnosis not present

## 2021-09-04 DIAGNOSIS — D469 Myelodysplastic syndrome, unspecified: Secondary | ICD-10-CM | POA: Diagnosis not present

## 2021-09-04 DIAGNOSIS — W06XXXA Fall from bed, initial encounter: Secondary | ICD-10-CM | POA: Diagnosis not present

## 2021-09-04 DIAGNOSIS — J9621 Acute and chronic respiratory failure with hypoxia: Secondary | ICD-10-CM | POA: Diagnosis not present

## 2021-09-04 DIAGNOSIS — R58 Hemorrhage, not elsewhere classified: Secondary | ICD-10-CM | POA: Diagnosis not present

## 2021-09-04 DIAGNOSIS — S0003XA Contusion of scalp, initial encounter: Secondary | ICD-10-CM | POA: Diagnosis not present

## 2021-09-04 DIAGNOSIS — R531 Weakness: Secondary | ICD-10-CM | POA: Diagnosis not present

## 2021-09-04 DIAGNOSIS — I499 Cardiac arrhythmia, unspecified: Secondary | ICD-10-CM | POA: Diagnosis not present

## 2021-09-04 DIAGNOSIS — Z6823 Body mass index (BMI) 23.0-23.9, adult: Secondary | ICD-10-CM | POA: Diagnosis not present

## 2021-09-04 DIAGNOSIS — E222 Syndrome of inappropriate secretion of antidiuretic hormone: Secondary | ICD-10-CM | POA: Diagnosis not present

## 2021-09-04 DIAGNOSIS — R41841 Cognitive communication deficit: Secondary | ICD-10-CM | POA: Diagnosis not present

## 2021-09-04 DIAGNOSIS — Z20822 Contact with and (suspected) exposure to covid-19: Secondary | ICD-10-CM | POA: Diagnosis not present

## 2021-09-04 DIAGNOSIS — J449 Chronic obstructive pulmonary disease, unspecified: Secondary | ICD-10-CM | POA: Diagnosis not present

## 2021-09-04 DIAGNOSIS — J9612 Chronic respiratory failure with hypercapnia: Secondary | ICD-10-CM | POA: Diagnosis not present

## 2021-09-04 DIAGNOSIS — Y95 Nosocomial condition: Secondary | ICD-10-CM | POA: Diagnosis not present

## 2021-09-04 DIAGNOSIS — E871 Hypo-osmolality and hyponatremia: Secondary | ICD-10-CM | POA: Diagnosis not present

## 2021-09-04 DIAGNOSIS — F3341 Major depressive disorder, recurrent, in partial remission: Secondary | ICD-10-CM | POA: Diagnosis not present

## 2021-09-04 DIAGNOSIS — I10 Essential (primary) hypertension: Secondary | ICD-10-CM | POA: Diagnosis not present

## 2021-09-04 DIAGNOSIS — D709 Neutropenia, unspecified: Secondary | ICD-10-CM | POA: Diagnosis not present

## 2021-09-04 DIAGNOSIS — S0990XA Unspecified injury of head, initial encounter: Secondary | ICD-10-CM | POA: Diagnosis not present

## 2021-09-04 DIAGNOSIS — Z85118 Personal history of other malignant neoplasm of bronchus and lung: Secondary | ICD-10-CM | POA: Diagnosis not present

## 2021-09-04 DIAGNOSIS — R1312 Dysphagia, oropharyngeal phase: Secondary | ICD-10-CM | POA: Diagnosis not present

## 2021-09-04 DIAGNOSIS — R069 Unspecified abnormalities of breathing: Secondary | ICD-10-CM | POA: Diagnosis not present

## 2021-09-04 DIAGNOSIS — Z8739 Personal history of other diseases of the musculoskeletal system and connective tissue: Secondary | ICD-10-CM | POA: Diagnosis not present

## 2021-09-04 DIAGNOSIS — E46 Unspecified protein-calorie malnutrition: Secondary | ICD-10-CM | POA: Diagnosis not present

## 2021-09-05 ENCOUNTER — Other Ambulatory Visit: Payer: Self-pay | Admitting: *Deleted

## 2021-09-05 DIAGNOSIS — D708 Other neutropenia: Secondary | ICD-10-CM | POA: Diagnosis not present

## 2021-09-05 DIAGNOSIS — Z8739 Personal history of other diseases of the musculoskeletal system and connective tissue: Secondary | ICD-10-CM | POA: Diagnosis not present

## 2021-09-05 DIAGNOSIS — Z6823 Body mass index (BMI) 23.0-23.9, adult: Secondary | ICD-10-CM | POA: Diagnosis not present

## 2021-09-05 DIAGNOSIS — E871 Hypo-osmolality and hyponatremia: Secondary | ICD-10-CM | POA: Diagnosis not present

## 2021-09-05 DIAGNOSIS — I10 Essential (primary) hypertension: Secondary | ICD-10-CM | POA: Diagnosis not present

## 2021-09-05 DIAGNOSIS — E034 Atrophy of thyroid (acquired): Secondary | ICD-10-CM | POA: Diagnosis not present

## 2021-09-05 DIAGNOSIS — J159 Unspecified bacterial pneumonia: Secondary | ICD-10-CM | POA: Diagnosis not present

## 2021-09-05 DIAGNOSIS — R0902 Hypoxemia: Secondary | ICD-10-CM | POA: Diagnosis not present

## 2021-09-05 DIAGNOSIS — D469 Myelodysplastic syndrome, unspecified: Secondary | ICD-10-CM | POA: Diagnosis not present

## 2021-09-05 DIAGNOSIS — F3341 Major depressive disorder, recurrent, in partial remission: Secondary | ICD-10-CM | POA: Diagnosis not present

## 2021-09-05 DIAGNOSIS — E222 Syndrome of inappropriate secretion of antidiuretic hormone: Secondary | ICD-10-CM | POA: Diagnosis not present

## 2021-09-05 NOTE — Patient Outreach (Signed)
[facility]) Care Management  09/05/2021  Lisa [name] 1934-03-10 [id number]   Noted that member discharged from hospital yesterday to SNF.  Will close case at this time, anticipate new referral pending disposition from from SNF.  Valente David, [place], MSN [place] [phone]

## 2021-09-07 DIAGNOSIS — R531 Weakness: Secondary | ICD-10-CM | POA: Diagnosis not present

## 2021-09-07 DIAGNOSIS — W06XXXA Fall from bed, initial encounter: Secondary | ICD-10-CM | POA: Diagnosis not present

## 2021-09-07 DIAGNOSIS — Y998 Other external cause status: Secondary | ICD-10-CM | POA: Diagnosis not present

## 2021-09-07 DIAGNOSIS — S0083XA Contusion of other part of head, initial encounter: Secondary | ICD-10-CM | POA: Diagnosis not present

## 2021-09-07 DIAGNOSIS — S0003XA Contusion of scalp, initial encounter: Secondary | ICD-10-CM | POA: Diagnosis not present

## 2021-09-08 DIAGNOSIS — D469 Myelodysplastic syndrome, unspecified: Secondary | ICD-10-CM | POA: Diagnosis not present

## 2021-09-08 DIAGNOSIS — E222 Syndrome of inappropriate secretion of antidiuretic hormone: Secondary | ICD-10-CM | POA: Diagnosis not present

## 2021-09-17 ENCOUNTER — Ambulatory Visit (HOSPITAL_COMMUNITY): Payer: TRICARE For Life (TFL) | Admitting: Hematology

## 2021-09-17 ENCOUNTER — Ambulatory Visit (HOSPITAL_COMMUNITY): Payer: TRICARE For Life (TFL)

## 2021-09-17 ENCOUNTER — Other Ambulatory Visit (HOSPITAL_COMMUNITY): Payer: TRICARE For Life (TFL)

## 2021-09-17 DIAGNOSIS — J449 Chronic obstructive pulmonary disease, unspecified: Secondary | ICD-10-CM | POA: Diagnosis not present

## 2021-09-17 DIAGNOSIS — I10 Essential (primary) hypertension: Secondary | ICD-10-CM | POA: Diagnosis not present

## 2021-09-17 DIAGNOSIS — E222 Syndrome of inappropriate secretion of antidiuretic hormone: Secondary | ICD-10-CM | POA: Diagnosis not present

## 2021-09-17 DIAGNOSIS — D469 Myelodysplastic syndrome, unspecified: Secondary | ICD-10-CM | POA: Diagnosis not present

## 2021-09-23 DIAGNOSIS — F3341 Major depressive disorder, recurrent, in partial remission: Secondary | ICD-10-CM | POA: Diagnosis not present

## 2021-09-23 DIAGNOSIS — I10 Essential (primary) hypertension: Secondary | ICD-10-CM | POA: Diagnosis not present

## 2021-09-23 DIAGNOSIS — J449 Chronic obstructive pulmonary disease, unspecified: Secondary | ICD-10-CM | POA: Diagnosis not present

## 2021-09-23 DIAGNOSIS — E222 Syndrome of inappropriate secretion of antidiuretic hormone: Secondary | ICD-10-CM | POA: Diagnosis not present

## 2021-09-23 DIAGNOSIS — D469 Myelodysplastic syndrome, unspecified: Secondary | ICD-10-CM | POA: Diagnosis not present

## 2021-09-24 ENCOUNTER — Ambulatory Visit (HOSPITAL_COMMUNITY): Payer: TRICARE For Life (TFL) | Admitting: Hematology

## 2021-09-24 ENCOUNTER — Other Ambulatory Visit (HOSPITAL_COMMUNITY): Payer: Self-pay

## 2021-09-24 ENCOUNTER — Inpatient Hospital Stay (HOSPITAL_COMMUNITY): Payer: Medicare Other

## 2021-09-24 ENCOUNTER — Ambulatory Visit (HOSPITAL_COMMUNITY): Payer: TRICARE For Life (TFL)

## 2021-09-24 ENCOUNTER — Other Ambulatory Visit (HOSPITAL_COMMUNITY): Payer: TRICARE For Life (TFL)

## 2021-09-24 DIAGNOSIS — D469 Myelodysplastic syndrome, unspecified: Secondary | ICD-10-CM

## 2021-09-24 DIAGNOSIS — Z85118 Personal history of other malignant neoplasm of bronchus and lung: Secondary | ICD-10-CM | POA: Diagnosis not present

## 2021-09-24 DIAGNOSIS — D539 Nutritional anemia, unspecified: Secondary | ICD-10-CM

## 2021-09-24 DIAGNOSIS — J949 Pleural condition, unspecified: Secondary | ICD-10-CM | POA: Diagnosis not present

## 2021-09-24 DIAGNOSIS — Z853 Personal history of malignant neoplasm of breast: Secondary | ICD-10-CM | POA: Diagnosis not present

## 2021-09-24 DIAGNOSIS — Z7951 Long term (current) use of inhaled steroids: Secondary | ICD-10-CM | POA: Diagnosis not present

## 2021-09-24 DIAGNOSIS — J449 Chronic obstructive pulmonary disease, unspecified: Secondary | ICD-10-CM | POA: Diagnosis not present

## 2021-09-24 DIAGNOSIS — J439 Emphysema, unspecified: Secondary | ICD-10-CM | POA: Insufficient documentation

## 2021-09-29 ENCOUNTER — Inpatient Hospital Stay (HOSPITAL_COMMUNITY): Payer: Medicare Other | Admitting: Hematology

## 2021-09-29 DIAGNOSIS — J439 Emphysema, unspecified: Secondary | ICD-10-CM | POA: Diagnosis not present

## 2021-10-03 DIAGNOSIS — E46 Unspecified protein-calorie malnutrition: Secondary | ICD-10-CM | POA: Diagnosis not present

## 2021-10-03 DIAGNOSIS — J9621 Acute and chronic respiratory failure with hypoxia: Secondary | ICD-10-CM | POA: Diagnosis not present

## 2021-10-03 DIAGNOSIS — E871 Hypo-osmolality and hyponatremia: Secondary | ICD-10-CM | POA: Diagnosis not present

## 2021-10-03 DIAGNOSIS — D708 Other neutropenia: Secondary | ICD-10-CM | POA: Diagnosis not present

## 2021-10-03 DIAGNOSIS — R634 Abnormal weight loss: Secondary | ICD-10-CM | POA: Diagnosis not present

## 2021-10-03 DIAGNOSIS — I10 Essential (primary) hypertension: Secondary | ICD-10-CM | POA: Diagnosis not present

## 2021-10-03 DIAGNOSIS — E034 Atrophy of thyroid (acquired): Secondary | ICD-10-CM | POA: Diagnosis not present

## 2021-10-03 DIAGNOSIS — R1310 Dysphagia, unspecified: Secondary | ICD-10-CM | POA: Diagnosis not present

## 2021-10-03 DIAGNOSIS — M109 Gout, unspecified: Secondary | ICD-10-CM | POA: Diagnosis not present

## 2021-10-03 DIAGNOSIS — G928 Other toxic encephalopathy: Secondary | ICD-10-CM | POA: Diagnosis not present

## 2021-10-03 DIAGNOSIS — J449 Chronic obstructive pulmonary disease, unspecified: Secondary | ICD-10-CM | POA: Diagnosis not present

## 2021-10-03 DIAGNOSIS — Z85118 Personal history of other malignant neoplasm of bronchus and lung: Secondary | ICD-10-CM | POA: Diagnosis not present

## 2021-10-03 DIAGNOSIS — Z853 Personal history of malignant neoplasm of breast: Secondary | ICD-10-CM | POA: Diagnosis not present

## 2021-10-03 DIAGNOSIS — Z7951 Long term (current) use of inhaled steroids: Secondary | ICD-10-CM | POA: Diagnosis not present

## 2021-10-03 DIAGNOSIS — Z87891 Personal history of nicotine dependence: Secondary | ICD-10-CM | POA: Diagnosis not present

## 2021-10-03 DIAGNOSIS — C9312 Chronic myelomonocytic leukemia, in relapse: Secondary | ICD-10-CM | POA: Diagnosis not present

## 2021-10-03 DIAGNOSIS — R531 Weakness: Secondary | ICD-10-CM | POA: Diagnosis not present

## 2021-10-03 DIAGNOSIS — D696 Thrombocytopenia, unspecified: Secondary | ICD-10-CM | POA: Diagnosis not present

## 2021-10-03 DIAGNOSIS — D630 Anemia in neoplastic disease: Secondary | ICD-10-CM | POA: Diagnosis not present

## 2021-10-03 DIAGNOSIS — Z8701 Personal history of pneumonia (recurrent): Secondary | ICD-10-CM | POA: Diagnosis not present

## 2021-10-03 DIAGNOSIS — J9612 Chronic respiratory failure with hypercapnia: Secondary | ICD-10-CM | POA: Diagnosis not present

## 2021-10-03 DIAGNOSIS — Z9981 Dependence on supplemental oxygen: Secondary | ICD-10-CM | POA: Diagnosis not present

## 2021-10-03 DIAGNOSIS — F3341 Major depressive disorder, recurrent, in partial remission: Secondary | ICD-10-CM | POA: Diagnosis not present

## 2021-10-03 DIAGNOSIS — Z6823 Body mass index (BMI) 23.0-23.9, adult: Secondary | ICD-10-CM | POA: Diagnosis not present

## 2021-10-03 DIAGNOSIS — D469 Myelodysplastic syndrome, unspecified: Secondary | ICD-10-CM | POA: Diagnosis not present

## 2021-10-14 DIAGNOSIS — J439 Emphysema, unspecified: Secondary | ICD-10-CM | POA: Diagnosis not present

## 2021-10-15 DIAGNOSIS — D469 Myelodysplastic syndrome, unspecified: Secondary | ICD-10-CM | POA: Diagnosis not present

## 2021-10-15 DIAGNOSIS — I10 Essential (primary) hypertension: Secondary | ICD-10-CM | POA: Diagnosis not present

## 2021-10-15 DIAGNOSIS — J449 Chronic obstructive pulmonary disease, unspecified: Secondary | ICD-10-CM | POA: Diagnosis not present

## 2021-10-15 DIAGNOSIS — Z8701 Personal history of pneumonia (recurrent): Secondary | ICD-10-CM | POA: Diagnosis not present

## 2021-10-15 DIAGNOSIS — E871 Hypo-osmolality and hyponatremia: Secondary | ICD-10-CM | POA: Diagnosis not present

## 2021-10-15 DIAGNOSIS — J9612 Chronic respiratory failure with hypercapnia: Secondary | ICD-10-CM | POA: Diagnosis not present

## 2021-10-29 DIAGNOSIS — E871 Hypo-osmolality and hyponatremia: Secondary | ICD-10-CM | POA: Diagnosis not present

## 2021-10-29 DIAGNOSIS — J9612 Chronic respiratory failure with hypercapnia: Secondary | ICD-10-CM | POA: Diagnosis not present

## 2021-10-29 DIAGNOSIS — D469 Myelodysplastic syndrome, unspecified: Secondary | ICD-10-CM | POA: Diagnosis not present

## 2021-10-29 DIAGNOSIS — J449 Chronic obstructive pulmonary disease, unspecified: Secondary | ICD-10-CM | POA: Diagnosis not present

## 2021-10-29 DIAGNOSIS — I10 Essential (primary) hypertension: Secondary | ICD-10-CM | POA: Diagnosis not present

## 2021-10-29 DIAGNOSIS — Z8701 Personal history of pneumonia (recurrent): Secondary | ICD-10-CM | POA: Diagnosis not present

## 2021-10-31 ENCOUNTER — Other Ambulatory Visit (HOSPITAL_COMMUNITY): Payer: Self-pay | Admitting: Hematology and Oncology

## 2021-11-02 DIAGNOSIS — M109 Gout, unspecified: Secondary | ICD-10-CM | POA: Diagnosis not present

## 2021-11-02 DIAGNOSIS — R1310 Dysphagia, unspecified: Secondary | ICD-10-CM | POA: Diagnosis not present

## 2021-11-02 DIAGNOSIS — Z87891 Personal history of nicotine dependence: Secondary | ICD-10-CM | POA: Diagnosis not present

## 2021-11-02 DIAGNOSIS — Z8701 Personal history of pneumonia (recurrent): Secondary | ICD-10-CM | POA: Diagnosis not present

## 2021-11-02 DIAGNOSIS — I10 Essential (primary) hypertension: Secondary | ICD-10-CM | POA: Diagnosis not present

## 2021-11-02 DIAGNOSIS — J9621 Acute and chronic respiratory failure with hypoxia: Secondary | ICD-10-CM | POA: Diagnosis not present

## 2021-11-02 DIAGNOSIS — R531 Weakness: Secondary | ICD-10-CM | POA: Diagnosis not present

## 2021-11-02 DIAGNOSIS — F3341 Major depressive disorder, recurrent, in partial remission: Secondary | ICD-10-CM | POA: Diagnosis not present

## 2021-11-02 DIAGNOSIS — E034 Atrophy of thyroid (acquired): Secondary | ICD-10-CM | POA: Diagnosis not present

## 2021-11-02 DIAGNOSIS — E871 Hypo-osmolality and hyponatremia: Secondary | ICD-10-CM | POA: Diagnosis not present

## 2021-11-02 DIAGNOSIS — G928 Other toxic encephalopathy: Secondary | ICD-10-CM | POA: Diagnosis not present

## 2021-11-02 DIAGNOSIS — J449 Chronic obstructive pulmonary disease, unspecified: Secondary | ICD-10-CM | POA: Diagnosis not present

## 2021-11-02 DIAGNOSIS — Z6823 Body mass index (BMI) 23.0-23.9, adult: Secondary | ICD-10-CM | POA: Diagnosis not present

## 2021-11-02 DIAGNOSIS — Z7951 Long term (current) use of inhaled steroids: Secondary | ICD-10-CM | POA: Diagnosis not present

## 2021-11-02 DIAGNOSIS — Z85118 Personal history of other malignant neoplasm of bronchus and lung: Secondary | ICD-10-CM | POA: Diagnosis not present

## 2021-11-02 DIAGNOSIS — Z9981 Dependence on supplemental oxygen: Secondary | ICD-10-CM | POA: Diagnosis not present

## 2021-11-02 DIAGNOSIS — D630 Anemia in neoplastic disease: Secondary | ICD-10-CM | POA: Diagnosis not present

## 2021-11-02 DIAGNOSIS — D696 Thrombocytopenia, unspecified: Secondary | ICD-10-CM | POA: Diagnosis not present

## 2021-11-02 DIAGNOSIS — R634 Abnormal weight loss: Secondary | ICD-10-CM | POA: Diagnosis not present

## 2021-11-02 DIAGNOSIS — C9312 Chronic myelomonocytic leukemia, in relapse: Secondary | ICD-10-CM | POA: Diagnosis not present

## 2021-11-02 DIAGNOSIS — Z853 Personal history of malignant neoplasm of breast: Secondary | ICD-10-CM | POA: Diagnosis not present

## 2021-11-02 DIAGNOSIS — D469 Myelodysplastic syndrome, unspecified: Secondary | ICD-10-CM | POA: Diagnosis not present

## 2021-11-02 DIAGNOSIS — E46 Unspecified protein-calorie malnutrition: Secondary | ICD-10-CM | POA: Diagnosis not present

## 2021-11-02 DIAGNOSIS — D708 Other neutropenia: Secondary | ICD-10-CM | POA: Diagnosis not present

## 2021-11-02 DIAGNOSIS — J9612 Chronic respiratory failure with hypercapnia: Secondary | ICD-10-CM | POA: Diagnosis not present

## 2021-12-06 DIAGNOSIS — Z20822 Contact with and (suspected) exposure to covid-19: Secondary | ICD-10-CM | POA: Diagnosis not present

## 2021-12-12 DIAGNOSIS — I4891 Unspecified atrial fibrillation: Secondary | ICD-10-CM | POA: Diagnosis not present

## 2021-12-12 DIAGNOSIS — E559 Vitamin D deficiency, unspecified: Secondary | ICD-10-CM | POA: Diagnosis not present

## 2021-12-12 DIAGNOSIS — E039 Hypothyroidism, unspecified: Secondary | ICD-10-CM | POA: Diagnosis not present

## 2021-12-12 DIAGNOSIS — E782 Mixed hyperlipidemia: Secondary | ICD-10-CM | POA: Diagnosis not present

## 2021-12-12 DIAGNOSIS — R7301 Impaired fasting glucose: Secondary | ICD-10-CM | POA: Diagnosis not present

## 2021-12-17 DIAGNOSIS — I10 Essential (primary) hypertension: Secondary | ICD-10-CM | POA: Diagnosis not present

## 2021-12-17 DIAGNOSIS — E559 Vitamin D deficiency, unspecified: Secondary | ICD-10-CM | POA: Diagnosis not present

## 2021-12-17 DIAGNOSIS — M6281 Muscle weakness (generalized): Secondary | ICD-10-CM | POA: Diagnosis not present

## 2021-12-17 DIAGNOSIS — J9610 Chronic respiratory failure, unspecified whether with hypoxia or hypercapnia: Secondary | ICD-10-CM | POA: Diagnosis not present

## 2021-12-17 DIAGNOSIS — R197 Diarrhea, unspecified: Secondary | ICD-10-CM | POA: Diagnosis not present

## 2021-12-17 DIAGNOSIS — E039 Hypothyroidism, unspecified: Secondary | ICD-10-CM | POA: Diagnosis not present

## 2021-12-17 DIAGNOSIS — R809 Proteinuria, unspecified: Secondary | ICD-10-CM | POA: Diagnosis not present

## 2021-12-17 DIAGNOSIS — G4700 Insomnia, unspecified: Secondary | ICD-10-CM | POA: Diagnosis not present

## 2021-12-17 DIAGNOSIS — E871 Hypo-osmolality and hyponatremia: Secondary | ICD-10-CM | POA: Diagnosis not present

## 2021-12-17 DIAGNOSIS — I4891 Unspecified atrial fibrillation: Secondary | ICD-10-CM | POA: Diagnosis not present

## 2021-12-17 DIAGNOSIS — Z0001 Encounter for general adult medical examination with abnormal findings: Secondary | ICD-10-CM | POA: Diagnosis not present

## 2021-12-17 DIAGNOSIS — R627 Adult failure to thrive: Secondary | ICD-10-CM | POA: Diagnosis not present

## 2021-12-19 IMAGING — MG MM DIGITAL SCREENING BILAT W/ TOMO AND CAD
8 series · 8 of 24 positions shown · non-contrast
Comparison: Previous exam(s).

CLINICAL DATA: Screening.

EXAM:
DIGITAL SCREENING BILATERAL MAMMOGRAM WITH TOMOSYNTHESIS AND CAD
TECHNIQUE: Bilateral screening digital craniocaudal and mediolateral oblique
mammograms were obtained. Bilateral screening digital breast
tomosynthesis was performed. The images were evaluated with
computer-aided detection.

[L CC synth-2D]
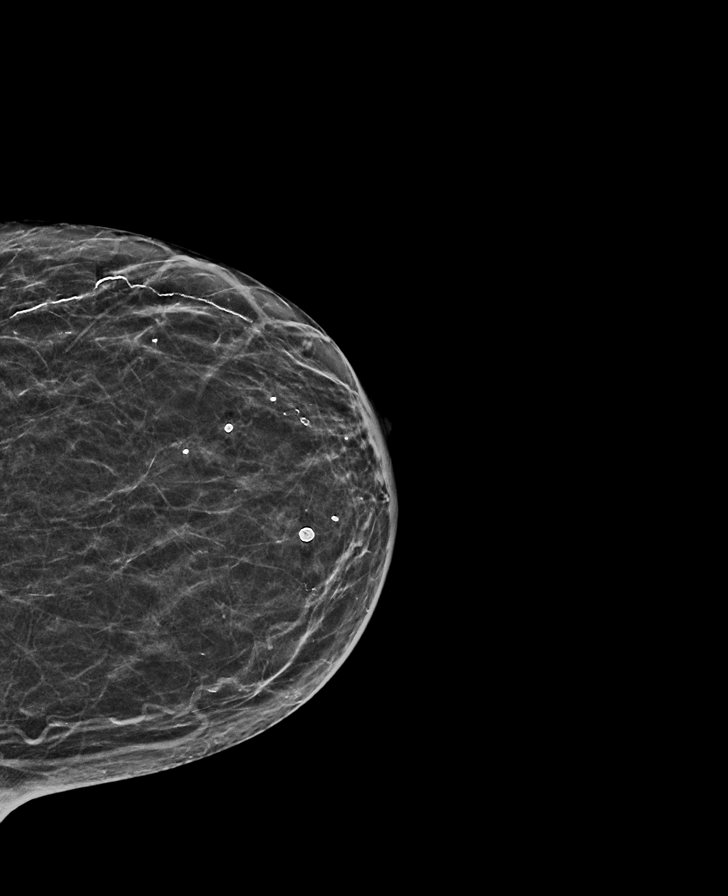

[R CC synth-2D]
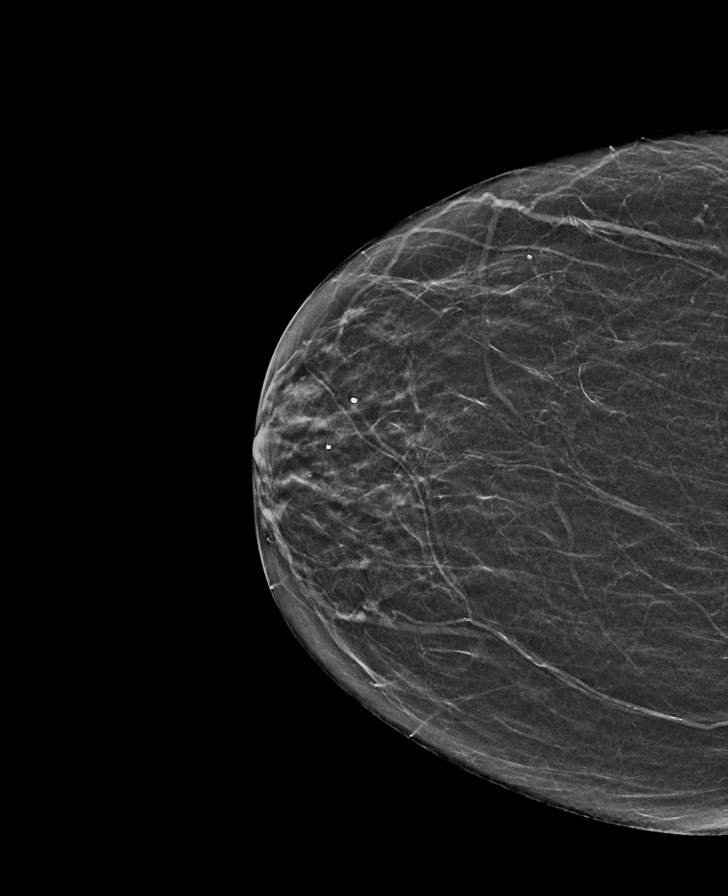

[L MLO synth-2D]
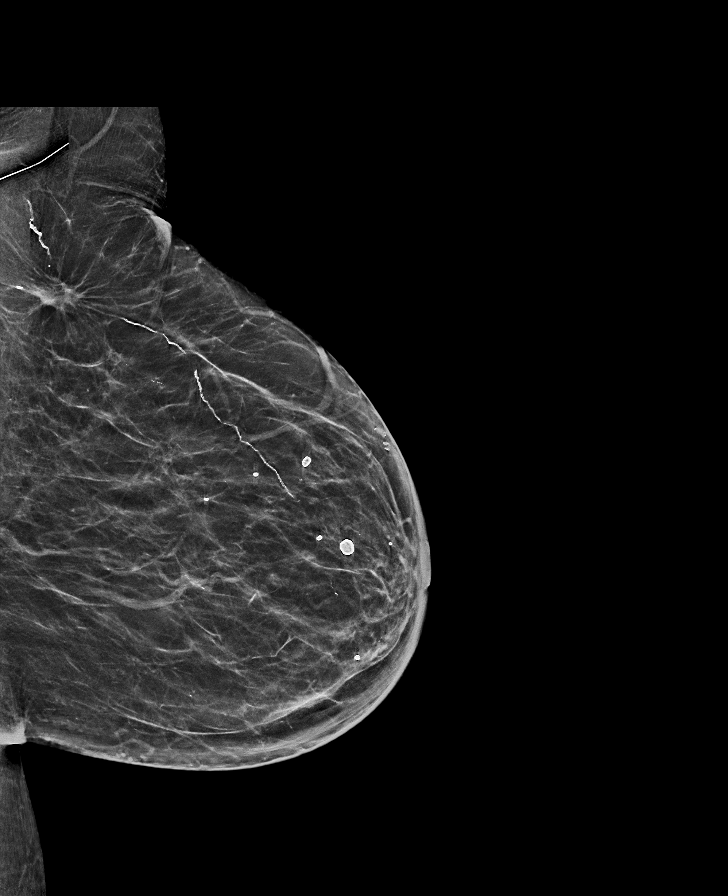

[R MLO synth-2D]
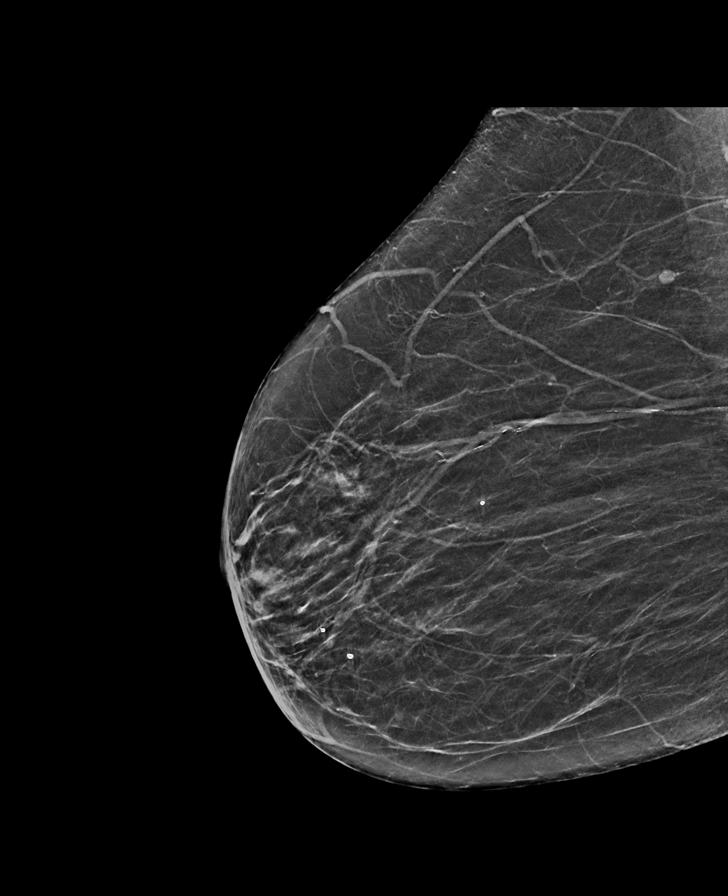

[R CC tomo · tomo slice 22/43.0]
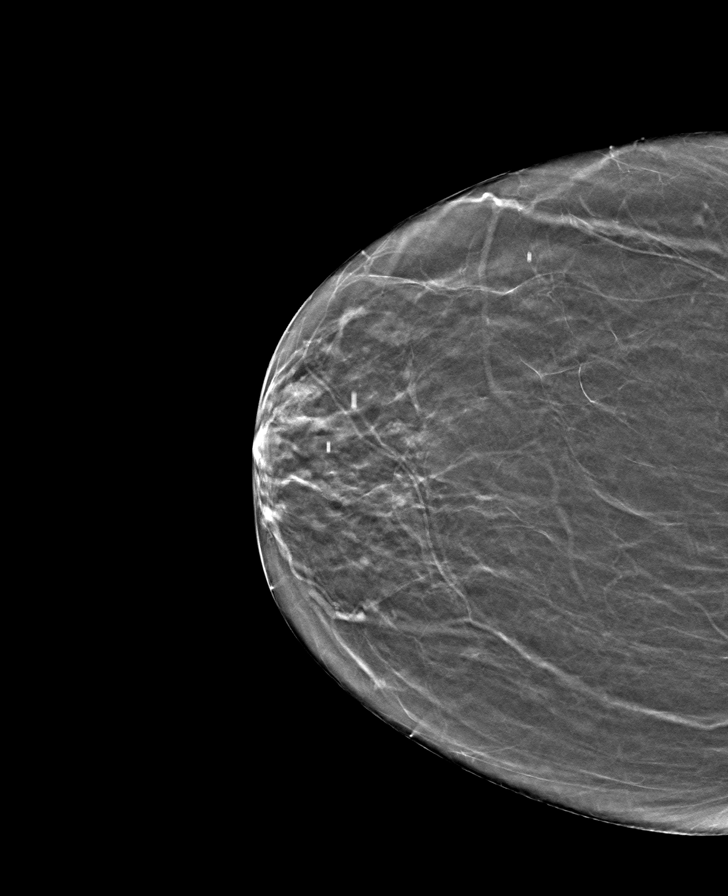

[L MLO tomo · tomo slice 28/55.0]
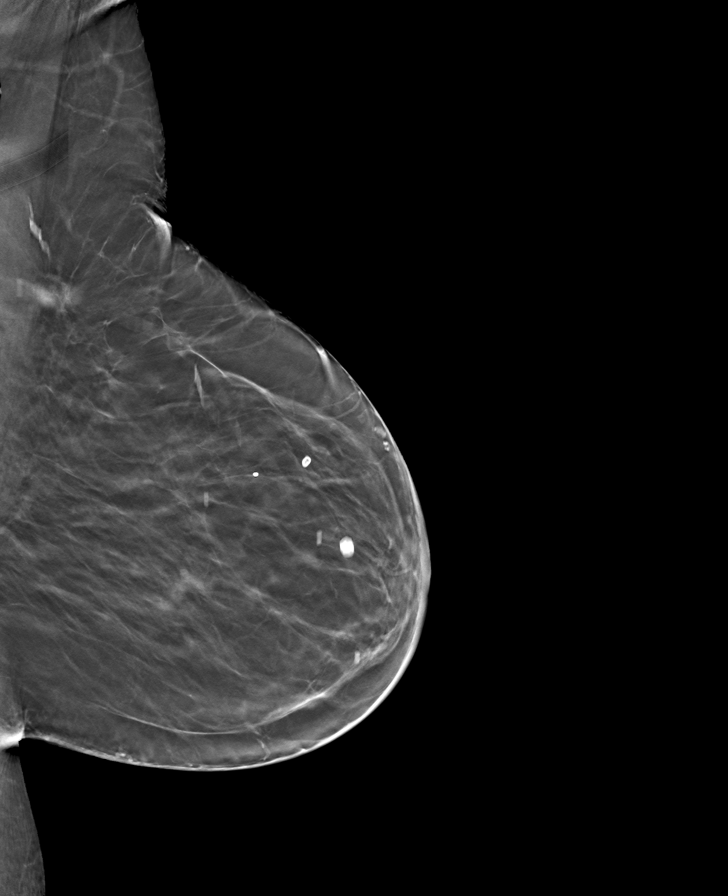

[L CC tomo · tomo slice 25/50.0]
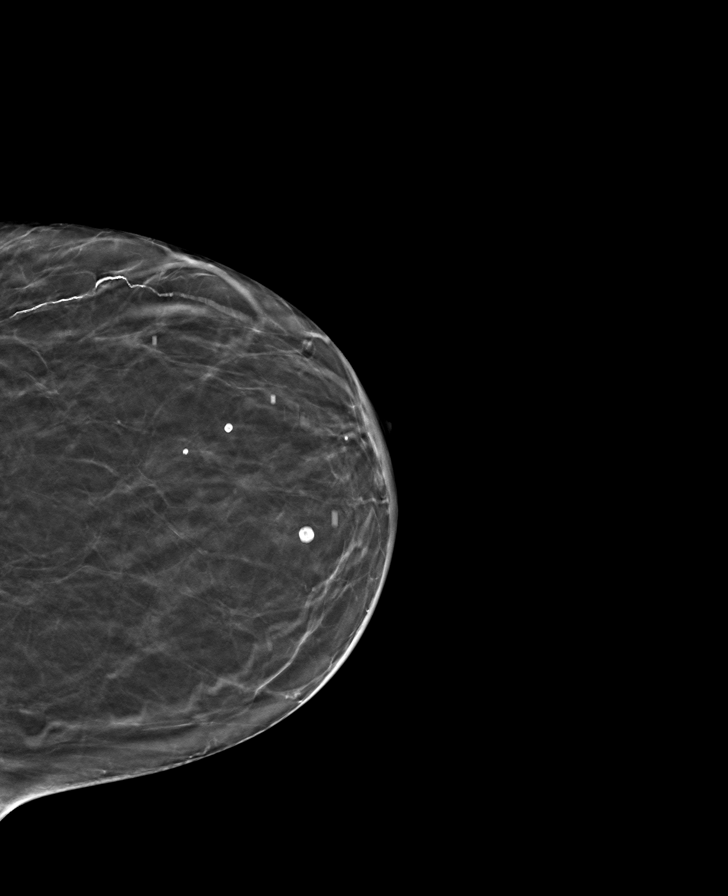

[R MLO tomo · tomo slice 25/50.0]
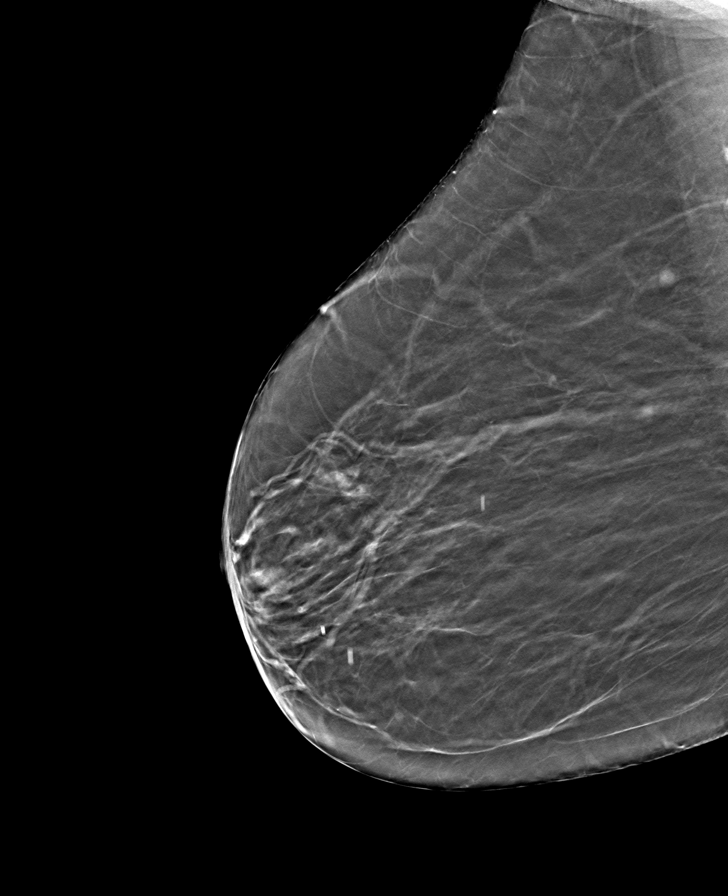

[8 of 24 positions shown; findings below may reference images not displayed]

ACR Breast Density Category b: There are scattered areas of
fibroglandular density.
FINDINGS: There are no findings suspicious for malignancy. The images were
evaluated with computer-aided detection.
IMPRESSION: No mammographic evidence of malignancy. A result letter of this
screening mammogram will be mailed directly to the patient.

RECOMMENDATION:
Screening mammogram in one year. (Code:WJ-[id number])

BI-RADS CATEGORY  1: Negative.

## 2021-12-31 DIAGNOSIS — Z20822 Contact with and (suspected) exposure to covid-19: Secondary | ICD-10-CM | POA: Diagnosis not present

## 2022-01-08 DIAGNOSIS — E039 Hypothyroidism, unspecified: Secondary | ICD-10-CM | POA: Diagnosis not present

## 2022-01-14 DIAGNOSIS — R809 Proteinuria, unspecified: Secondary | ICD-10-CM | POA: Diagnosis not present

## 2022-01-14 DIAGNOSIS — I4891 Unspecified atrial fibrillation: Secondary | ICD-10-CM | POA: Diagnosis not present

## 2022-01-14 DIAGNOSIS — R627 Adult failure to thrive: Secondary | ICD-10-CM | POA: Diagnosis not present

## 2022-01-14 DIAGNOSIS — D469 Myelodysplastic syndrome, unspecified: Secondary | ICD-10-CM | POA: Diagnosis not present

## 2022-01-14 DIAGNOSIS — R197 Diarrhea, unspecified: Secondary | ICD-10-CM | POA: Diagnosis not present

## 2022-01-14 DIAGNOSIS — I10 Essential (primary) hypertension: Secondary | ICD-10-CM | POA: Diagnosis not present

## 2022-01-14 DIAGNOSIS — E039 Hypothyroidism, unspecified: Secondary | ICD-10-CM | POA: Diagnosis not present

## 2022-01-15 ENCOUNTER — Inpatient Hospital Stay (HOSPITAL_COMMUNITY): Payer: Medicare Other | Attending: Hematology

## 2022-01-15 ENCOUNTER — Inpatient Hospital Stay (HOSPITAL_COMMUNITY): Payer: Medicare Other

## 2022-01-15 ENCOUNTER — Other Ambulatory Visit: Payer: Self-pay

## 2022-01-15 ENCOUNTER — Inpatient Hospital Stay (HOSPITAL_COMMUNITY): Payer: TRICARE For Life (TFL)

## 2022-01-15 ENCOUNTER — Ambulatory Visit (HOSPITAL_COMMUNITY): Payer: TRICARE For Life (TFL) | Admitting: Hematology

## 2022-01-15 ENCOUNTER — Inpatient Hospital Stay (HOSPITAL_BASED_OUTPATIENT_CLINIC_OR_DEPARTMENT_OTHER): Payer: Medicare Other | Admitting: Hematology

## 2022-01-15 DIAGNOSIS — D61818 Other pancytopenia: Secondary | ICD-10-CM | POA: Diagnosis not present

## 2022-01-15 DIAGNOSIS — Z853 Personal history of malignant neoplasm of breast: Secondary | ICD-10-CM | POA: Diagnosis not present

## 2022-01-15 DIAGNOSIS — Z79899 Other long term (current) drug therapy: Secondary | ICD-10-CM | POA: Insufficient documentation

## 2022-01-15 DIAGNOSIS — G629 Polyneuropathy, unspecified: Secondary | ICD-10-CM | POA: Insufficient documentation

## 2022-01-15 DIAGNOSIS — E871 Hypo-osmolality and hyponatremia: Secondary | ICD-10-CM | POA: Insufficient documentation

## 2022-01-15 DIAGNOSIS — Z20828 Contact with and (suspected) exposure to other viral communicable diseases: Secondary | ICD-10-CM | POA: Diagnosis not present

## 2022-01-15 DIAGNOSIS — R197 Diarrhea, unspecified: Secondary | ICD-10-CM | POA: Insufficient documentation

## 2022-01-15 DIAGNOSIS — G479 Sleep disorder, unspecified: Secondary | ICD-10-CM | POA: Insufficient documentation

## 2022-01-15 DIAGNOSIS — D469 Myelodysplastic syndrome, unspecified: Secondary | ICD-10-CM | POA: Insufficient documentation

## 2022-01-15 DIAGNOSIS — Z888 Allergy status to other drugs, medicaments and biological substances status: Secondary | ICD-10-CM | POA: Insufficient documentation

## 2022-01-15 DIAGNOSIS — C3412 Malignant neoplasm of upper lobe, left bronchus or lung: Secondary | ICD-10-CM | POA: Insufficient documentation

## 2022-01-15 DIAGNOSIS — R059 Cough, unspecified: Secondary | ICD-10-CM | POA: Insufficient documentation

## 2022-01-15 DIAGNOSIS — D539 Nutritional anemia, unspecified: Secondary | ICD-10-CM | POA: Diagnosis not present

## 2022-01-15 DIAGNOSIS — M2550 Pain in unspecified joint: Secondary | ICD-10-CM | POA: Diagnosis not present

## 2022-01-15 LAB — CBC WITH DIFFERENTIAL/PLATELET
Abs Immature Granulocytes: 0.08 10*3/uL — ABNORMAL HIGH (ref 0.00–0.07)
Basophils Absolute: 0 10*3/uL (ref 0.0–0.1)
Basophils Relative: 0 %
Eosinophils Absolute: 0 10*3/uL (ref 0.0–0.5)
Eosinophils Relative: 0 %
HCT: 33.7 % — ABNORMAL LOW (ref 36.0–46.0)
Hemoglobin: 9.7 g/dL — ABNORMAL LOW (ref 12.0–15.0)
Immature Granulocytes: 3 %
Lymphocytes Relative: 53 %
Lymphs Abs: 1.4 10*3/uL (ref 0.7–4.0)
MCH: 28.6 pg (ref 26.0–34.0)
MCHC: 28.8 g/dL — ABNORMAL LOW (ref 30.0–36.0)
MCV: 99.4 fL (ref 80.0–100.0)
Monocytes Absolute: 1 10*3/uL (ref 0.1–1.0)
Monocytes Relative: 36 %
Neutro Abs: 0.2 10*3/uL — CL (ref 1.7–7.7)
Neutrophils Relative %: 8 %
Platelets: 66 10*3/uL — ABNORMAL LOW (ref 150–400)
RBC: 3.39 MIL/uL — ABNORMAL LOW (ref 3.87–5.11)
RDW: 18.4 % — ABNORMAL HIGH (ref 11.5–15.5)
WBC: 2.9 10*3/uL — ABNORMAL LOW (ref 4.0–10.5)
nRBC: 2.1 % — ABNORMAL HIGH (ref 0.0–0.2)

## 2022-01-15 LAB — IRON AND TIBC
Iron: 40 ug/dL (ref 28–170)
Saturation Ratios: 14 % (ref 10.4–31.8)
TIBC: 293 ug/dL (ref 250–450)
UIBC: 253 ug/dL

## 2022-01-15 LAB — SAMPLE TO BLOOD BANK

## 2022-01-15 LAB — FERRITIN: Ferritin: 277 ng/mL (ref 11–307)

## 2022-01-15 MED ORDER — DARBEPOETIN ALFA 200 MCG/0.4ML IJ SOSY
200.0000 ug | PREFILLED_SYRINGE | Freq: Once | INTRAMUSCULAR | Status: AC
  Administered 2022-01-15: 200 ug via SUBCUTANEOUS
  Filled 2022-01-15: qty 0.4

## 2022-01-15 NOTE — Patient Instructions (Signed)
[facility]  Discharge Instructions: ?Thank you for choosing [facility] to provide your oncology and hematology care.  ?If you have a lab appointment with the [facility], please come in thru the Main Entrance and check in at the main information desk. ? ?Wear comfortable clothing and clothing appropriate for easy access to any Portacath or PICC line.  ? ?We strive to give you quality time with your provider. You may need to reschedule your appointment if you arrive late (15 or more minutes).  Arriving late affects you and other patients whose appointments are after yours.  Also, if you miss three or more appointments without notifying the office, you may be dismissed from the clinic at the provider?s discretion.    ?  ?For prescription refill requests, have your pharmacy contact our office and allow 72 hours for refills to be completed.   ? ?Today you received the following [place], return as scheduled. ?  ?To help prevent nausea and vomiting after your treatment, we encourage you to take your nausea medication as directed. ? ?BELOW ARE SYMPTOMS THAT SHOULD BE REPORTED IMMEDIATELY: ?*FEVER GREATER THAN 100.4 F (38 ?C) OR HIGHER ?*CHILLS OR SWEATING ?*NAUSEA AND VOMITING THAT IS NOT CONTROLLED WITH YOUR NAUSEA MEDICATION ?*UNUSUAL SHORTNESS OF BREATH ?*UNUSUAL BRUISING OR BLEEDING ?*URINARY PROBLEMS (pain or burning when urinating, or frequent urination) ?*BOWEL PROBLEMS (unusual diarrhea, constipation, pain near the anus) ?TENDERNESS IN MOUTH AND THROAT WITH OR WITHOUT PRESENCE OF ULCERS (sore throat, sores in mouth, or a toothache) ?UNUSUAL RASH, SWELLING OR PAIN  ?UNUSUAL VAGINAL DISCHARGE OR ITCHING  ? ?Items with * indicate a potential emergency and should be followed up as soon as possible or go to the Emergency Department if any problems should occur. ? ?Please show the CHEMOTHERAPY ALERT CARD or IMMUNOTHERAPY ALERT CARD at check-in to the Emergency Department and triage  nurse. ? ?Should you have questions after your visit or need to cancel or reschedule your appointment, please contact [facility] [phone]  and follow the prompts.  Office hours are 8:00 a.m. to 4:30 p.m. Monday - Friday. Please note that voicemails left after 4:00 p.m. may not be returned until the following business day.  We are closed weekends and major holidays. You have access to a nurse at all times for urgent questions. Please call the main number to the clinic [phone] and follow the prompts. ? ?For any non-urgent questions, you may also contact your provider using MyChart. We now offer e-Visits for anyone 11 and older to request care online for non-urgent symptoms. For details visit [email or web address]. ?  ?Also download the MyChart app! Go to the app store, search "MyChart", open the app, select [facility], and log in with your MyChart username and password. ? ?Due to Covid, a mask is required upon entering the hospital/clinic. If you do not have a mask, one will be given to you upon arrival. For doctor visits, patients may have 1 support person aged 58 or older with them. For treatment visits, patients cannot have anyone with them due to current Covid guidelines and our immunocompromised population.  ?

## 2022-01-15 NOTE — Progress Notes (Signed)
? ?[facility] ?[address] ?[place], [place] [address] ? ? ?Patient Care Team: ?Celene Squibb, MD as PCP - General (Internal Medicine) ?Rourk, Cristopher Estimable, MD as Consulting Physician (Gastroenterology) ? ?SUMMARY OF ONCOLOGIC HISTORY: ?Oncology History Overview Note  ?Called Dr Juel Burrow office again now, office closed. ?Pt will see Dr Delton Coombes and I will ask him if he can talk to Dr Nevada Crane after his visit. ? ?Thanks, ?  ?Adenocarcinoma of left lung ([facility])  ?06/23/2016 Imaging  ? CT chest- Stable exam.  No new or progressive findings. Previously described tiny perifissural nodules at the base of the right major fissure are stable and likely represent subpleural lymph nodes. ?  ? ? ?CHIEF COMPLIANT: Follow-up for MDS and left breast cancer ? ? ?INTERVAL HISTORY: [name] is a 88 y.o. female here today for follow up of her MDS and left breast cancer. Her last visit was on 06/25/2021.  ? ?Today she reports feeling well. She denies recent infections. She reports pain in her legs and knees. Her appetite is good, and she is eating well. She has lost 9 lbs since her last visit.  ? ?REVIEW OF SYSTEMS:   ?Review of Systems  ?Constitutional:  Positive for unexpected weight change (-9 lbs). Negative for appetite change and fatigue.  ?Respiratory:  Positive for cough and shortness of breath.   ?Gastrointestinal:  Positive for diarrhea.  ?Musculoskeletal:  Positive for arthralgias (8/10 legs and knees).  ?Neurological:  Positive for numbness.  ?Psychiatric/Behavioral:  Positive for sleep disturbance.   ?All other systems reviewed and are negative. ? ?I have reviewed the past medical history, past surgical history, social history and family history with the patient and they are unchanged from previous note. ? ? ?ALLERGIES:   ?is allergic to meloxicam and micardis hct [telmisartan-hctz]. ? ? ?MEDICATIONS:  ?Current Outpatient Medications  ?Medication Sig Dispense Refill  ? acetaminophen (TYLENOL) 325 MG tablet Take 2  tablets (650 mg total) by mouth every 6 (six) hours.    ? furosemide (LASIX) 20 MG tablet Take 1 tablet (20 mg total) by mouth every other day. 30 tablet   ? levothyroxine (SYNTHROID) 100 MCG tablet Take 1 tablet (100 mcg total) by mouth daily before breakfast. 30 tablet 1  ? magnesium oxide (MAG-OX) 400 MG tablet Take by mouth.    ? metoprolol succinate (TOPROL-XL) 50 MG 24 hr tablet Take 50 mg by mouth daily.    ? mirtazapine (REMERON) 7.5 MG tablet Take 1 tablet (7.5 mg total) by mouth at bedtime. 30 tablet 2  ? Multiple Vitamin (MULTI-VITAMIN) tablet Take 1 tablet by mouth daily.    ? sodium chloride 1 g tablet Take 1 g by mouth daily.    ? umeclidinium bromide (INCRUSE ELLIPTA) 62.5 MCG/INH AEPB Inhale into the lungs daily.    ? dronabinol (MARINOL) 2.5 MG capsule Take 1 capsule (2.5 mg total) by mouth 2 (two) times daily before a meal. (Patient not taking: Reported on 01/15/2022) 60 capsule 0  ? loratadine (CLARITIN) 10 MG tablet Take 10 mg by mouth at bedtime as needed. (Patient not taking: Reported on 01/15/2022)    ? polyethylene glycol (MIRALAX / GLYCOLAX) 17 g packet Take 17 g by mouth daily as needed for moderate constipation. (Patient not taking: Reported on 01/15/2022) 14 each 0  ? senna-docusate (SENOKOT-S) 8.6-50 MG tablet Take 1 tablet by mouth at bedtime. (Patient not taking: Reported on 01/15/2022)    ? Vitamin D, Ergocalciferol, (DRISDOL) 1.25 MG (50000  UNIT) CAPS capsule Take 50,000 Units by mouth once a week. (Patient not taking: Reported on 01/15/2022)    ? ?No current facility-administered medications for this visit.  ? ? ? ?PHYSICAL EXAMINATION: ?Performance status (ECOG): 3 - Symptomatic, >50% confined to bed ? ?Vitals:  ? 01/15/22 1355  ?BP: (!) 181/78  ?Pulse: (!) 51  ?Resp: 18  ?Temp: (!) 97.3 ?F (36.3 ?C)  ?SpO2: 96%  ? ?Wt Readings from Last 3 Encounters:  ?01/15/22 127 lb 12.8 oz (58 kg)  ?08/27/21 130 lb 4.7 oz (59.1 kg)  ?06/25/21 136 lb 0.4 oz (61.7 kg)  ? ?Physical Exam ?Vitals  reviewed.  ?Constitutional:   ?   Appearance: Normal appearance.  ?   Comments: In wheelchair  ?Cardiovascular:  ?   Rate and Rhythm: Normal rate and regular rhythm.  ?   Pulses: Normal pulses.  ?   Heart sounds: Murmur heard.  ?Systolic murmur is present.  ?Pulmonary:  ?   Effort: Pulmonary effort is normal.  ?   Breath sounds: Normal breath sounds.  ?Abdominal:  ?   Palpations: Abdomen is soft. There is no hepatomegaly, splenomegaly or mass.  ?   Tenderness: There is no abdominal tenderness.  ?Musculoskeletal:  ?   Right lower leg: No edema.  ?   Left lower leg: No edema.  ?Lymphadenopathy:  ?   Upper Body:  ?   Right upper body: No supraclavicular adenopathy.  ?   Left upper body: No supraclavicular adenopathy.  ?Neurological:  ?   General: No focal deficit present.  ?   Mental Status: She is alert and oriented to person, place, and time.  ?Psychiatric:     ?   Mood and Affect: Mood normal.     ?   Behavior: Behavior normal.  ? ? ?Breast Exam Chaperone: Thana Ates   ? ? ?LABORATORY DATA:  ?I have reviewed the data as listed ?CMP Latest Ref Rng & Units 08/28/2021 08/27/2021 08/27/2021  ?Glucose 70 - 99 mg/dL 81 73 78  ?BUN 8 - 23 mg/dL 7(L) 7(L) 8  ?Creatinine 0.44 - 1.00 mg/dL 0.58 0.69 0.65  ?Sodium 135 - 145 mmol/L 125(L) 126(L) 124(L)  ?Potassium 3.5 - 5.1 mmol/L 4.4 3.2(L) 3.4(L)  ?Chloride 98 - 111 mmol/L 94(L) 91(L) 90(L)  ?CO2 22 - 32 mmol/L _0 ?Calcium 8.9 - 10.3 mg/dL 8.2(L) 8.1(L) 8.0(L)  ?Total Protein 6.5 - 8.1 g/dL - 7.5 7.8  ?Total Bilirubin 0.3 - 1.2 mg/dL - 0.5 0.8  ?Alkaline Phos 38 - 126 U/L - 42 43  ?AST 15 - 41 U/L - 28 29  ?ALT 0 - 44 U/L - 13 14  ? ?No results found for: DDU202 ?Lab Results  ?Component Value Date  ? WBC 2.9 (L) 01/15/2022  ? HGB 9.7 (L) 01/15/2022  ? HCT 33.7 (L) 01/15/2022  ? MCV 99.4 01/15/2022  ? PLT 66 (L) 01/15/2022  ? NEUTROABS 0.2 (LL) 01/15/2022  ? ? ?ASSESSMENT:  ?1.    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 Nevada Crane for pancytopenia.  Subsequently her white count has recovered along with recovery of the hemoglobin. ?-Bone marrow biopsy on 11/06/2020 shows hypercellular marrow with trilineage dyspoiesis and polytypic plasmacytosis.  Primary differential includes CMML and other MDS.  Definitive blast population is not identified morphologically or by flow.  There is a increased immature mononuclear cells favored to be monocytic in origin.  Megakaryocytes are frequently hypolobated and hyperchromatic without clustering.  Atypical appearing  erythroid precursors.  Increased plasma cells 5 to 9% which are polytypic by kappa and lambda staining. ?- Chromosome analysis shows 46,X, idiocentric X chromosome[9]/46, XX[11]. ?-MDS FISH panel was negative. ?-Revised IPSS score of 3.  In the absence of treatment, median survival of 5.3 years and 25% AML progression is 10.8 years. ?-SPEP, methylmalonic acid, ferritin levels were normal. ?- Serum copper level is 87. ?- NGS (11/19/2020): SRSF2, TET2 ?  ?2.  Stage I left breast cancer: ?-Diagnosed in Sep 1999, treated with lumpectomy and XRT. ER/PR was positive and received tamoxifen for 5 years. ?-Mammogram on 2019-10-30 was BI-RADS Category 2. Physical exam did not reveal any palpable masses today. ?  ?3.  Stage I adenocarcinoma of the left upper lobe of the lung: ?-Left upper lobectomy on 2005-06-01. CT chest on 2018-06-09 was stable. ?- CT CAP on 10/24/2020 shows stable postsurgical changes from the partial left upper lobe resection.  No other signs of malignancy. ? ? ?PLAN:  ?1.   Low risk MDS/CMML: ?- She was started on Aranesp on 05/28/2021.  Last dose was on 08/22/2021. ?- She is admitted to the hospital from 08/26/2021 through 08/28/2021 with hyponatremia.  She missed Aranesp after the admission. ?- CBC today shows hemoglobin 9.7.  White count is 2.9 with ANC of 0.2.  Platelet count is 66.  Ferritin is 277 and percent saturation is 14.  Normal renal function. ?- She does not have any recurrent infections. ?-  We will start her back on Aranesp 200 mcg every 4 weeks. ?- Recommend RTC 3 months with repeat labs including CBC, LDH, reticulocyte's, ferritin and iron panel.  We will also check NGS myeloid panel. ?  ?2.

## 2022-01-15 NOTE — Progress Notes (Unsigned)
CRITICAL VALUE ALERT ?Critical value received:  ANC 0.2 ?Date of notification:  01/15/2022 ?Time of notification: 13:41 Pm ?Critical value read back:  Yes.   ?Nurse who received alert:  B. Laqueta Bonaventura RN ?MD notified time and response:  Dr. Delton Coombes.  ?

## 2022-01-15 NOTE — Patient Instructions (Signed)
[facility] at [facility] ?Discharge Instructions ? ?You were seen and examined today by Dr. Delton Coombes. ? ?You should restart monthly injections.  ? ?Follow-up with Dr. Delton Coombes again in 3 months. ? ? ?Thank you for choosing [facility] at [facility] to provide your oncology and hematology care.  To afford each patient quality time with our provider, please arrive at least 15 minutes before your scheduled appointment time.  ? ?If you have a lab appointment with the [facility] please come in thru the Main Entrance and check in at the main information desk. ? ?You need to re-schedule your appointment should you arrive 10 or more minutes late.  We strive to give you quality time with our providers, and arriving late affects you and other patients whose appointments are after yours.  Also, if you no show three or more times for appointments you may be dismissed from the clinic at the providers discretion.     ?Again, thank you for choosing [facility].  Our hope is that these requests will decrease the amount of time that you wait before being seen by our physicians.       ?_____________________________________________________________ ? ?Should you have questions after your visit to [facility], please contact our office at [phone] and follow the prompts.  Our office hours are 8:00 a.m. and 4:30 p.m. Monday - Friday.  Please note that voicemails left after 4:00 p.m. may not be returned until the following business day.  We are closed weekends and major holidays.  You do have access to a nurse 24-7, just call the main number to the clinic [phone] and do not press any options, hold on the line and a nurse will answer the phone.   ? ?For prescription refill requests, have your pharmacy contact our office and allow 72 hours.   ? ?Due to Covid, you will need to wear a mask upon entering the hospital. If you do not have a mask, a mask  will be given to you at the Main Entrance upon arrival. For doctor visits, patients may have 1 support person age 44 or older with them. For treatment visits, patients can not have anyone with them due to social distancing guidelines and our immunocompromised population.  ? ? ? ?

## 2022-01-15 NOTE — Progress Notes (Signed)
Patient presents today for Aranesp injection. Hemoglobin reviewed prior to administration. VSS. Injection tolerated without incident or complaint. Patient stable during and after injection. See [name] for details. Patient discharged in satisfactory condition with no s/s of distress noted. 

## 2022-01-17 DIAGNOSIS — Z20822 Contact with and (suspected) exposure to covid-19: Secondary | ICD-10-CM | POA: Diagnosis not present

## 2022-01-23 LAB — MISC LABCORP TEST (SEND OUT): Labcorp test code: 451953

## 2022-02-07 DIAGNOSIS — Z20822 Contact with and (suspected) exposure to covid-19: Secondary | ICD-10-CM | POA: Diagnosis not present

## 2022-02-09 DIAGNOSIS — Z20822 Contact with and (suspected) exposure to covid-19: Secondary | ICD-10-CM | POA: Diagnosis not present

## 2022-02-10 ENCOUNTER — Emergency Department (HOSPITAL_COMMUNITY): Payer: Medicare Other

## 2022-02-10 ENCOUNTER — Encounter (HOSPITAL_COMMUNITY): Payer: Self-pay

## 2022-02-10 ENCOUNTER — Observation Stay (HOSPITAL_COMMUNITY)
Admission: EM | Admit: 2022-02-10 | Discharge: 2022-02-11 | Disposition: A | Discharge status: Home Health | Payer: Medicare Other | Attending: Internal Medicine | Admitting: Internal Medicine

## 2022-02-10 ENCOUNTER — Other Ambulatory Visit: Payer: Self-pay

## 2022-02-10 DIAGNOSIS — Z8673 Personal history of transient ischemic attack (TIA), and cerebral infarction without residual deficits: Secondary | ICD-10-CM | POA: Diagnosis not present

## 2022-02-10 DIAGNOSIS — I10 Essential (primary) hypertension: Secondary | ICD-10-CM | POA: Diagnosis present

## 2022-02-10 DIAGNOSIS — R0902 Hypoxemia: Secondary | ICD-10-CM | POA: Diagnosis not present

## 2022-02-10 DIAGNOSIS — I5032 Chronic diastolic (congestive) heart failure: Secondary | ICD-10-CM | POA: Diagnosis not present

## 2022-02-10 DIAGNOSIS — R410 Disorientation, unspecified: Secondary | ICD-10-CM | POA: Diagnosis not present

## 2022-02-10 DIAGNOSIS — N1832 Chronic kidney disease, stage 3b: Secondary | ICD-10-CM | POA: Insufficient documentation

## 2022-02-10 DIAGNOSIS — R4182 Altered mental status, unspecified: Secondary | ICD-10-CM | POA: Diagnosis not present

## 2022-02-10 DIAGNOSIS — J45909 Unspecified asthma, uncomplicated: Secondary | ICD-10-CM | POA: Diagnosis not present

## 2022-02-10 DIAGNOSIS — Z853 Personal history of malignant neoplasm of breast: Secondary | ICD-10-CM | POA: Insufficient documentation

## 2022-02-10 DIAGNOSIS — N179 Acute kidney failure, unspecified: Secondary | ICD-10-CM | POA: Diagnosis present

## 2022-02-10 DIAGNOSIS — Z79899 Other long term (current) drug therapy: Secondary | ICD-10-CM | POA: Insufficient documentation

## 2022-02-10 DIAGNOSIS — E039 Hypothyroidism, unspecified: Secondary | ICD-10-CM | POA: Diagnosis not present

## 2022-02-10 DIAGNOSIS — I5033 Acute on chronic diastolic (congestive) heart failure: Secondary | ICD-10-CM | POA: Diagnosis present

## 2022-02-10 DIAGNOSIS — N183 Chronic kidney disease, stage 3 unspecified: Secondary | ICD-10-CM

## 2022-02-10 DIAGNOSIS — N1831 Chronic kidney disease, stage 3a: Secondary | ICD-10-CM

## 2022-02-10 DIAGNOSIS — D709 Neutropenia, unspecified: Secondary | ICD-10-CM | POA: Diagnosis present

## 2022-02-10 DIAGNOSIS — D469 Myelodysplastic syndrome, unspecified: Secondary | ICD-10-CM | POA: Diagnosis present

## 2022-02-10 DIAGNOSIS — I4891 Unspecified atrial fibrillation: Secondary | ICD-10-CM | POA: Diagnosis present

## 2022-02-10 DIAGNOSIS — G459 Transient cerebral ischemic attack, unspecified: Principal | ICD-10-CM | POA: Insufficient documentation

## 2022-02-10 DIAGNOSIS — E440 Moderate protein-calorie malnutrition: Secondary | ICD-10-CM | POA: Diagnosis present

## 2022-02-10 DIAGNOSIS — R739 Hyperglycemia, unspecified: Secondary | ICD-10-CM | POA: Diagnosis present

## 2022-02-10 DIAGNOSIS — Z87891 Personal history of nicotine dependence: Secondary | ICD-10-CM | POA: Diagnosis not present

## 2022-02-10 DIAGNOSIS — I130 Hypertensive heart and chronic kidney disease with heart failure and stage 1 through stage 4 chronic kidney disease, or unspecified chronic kidney disease: Secondary | ICD-10-CM | POA: Diagnosis not present

## 2022-02-10 DIAGNOSIS — Z85118 Personal history of other malignant neoplasm of bronchus and lung: Secondary | ICD-10-CM | POA: Diagnosis not present

## 2022-02-10 HISTORY — DX: Transient cerebral ischemic attack, unspecified: G45.9

## 2022-02-10 LAB — URINALYSIS, ROUTINE W REFLEX MICROSCOPIC
Bacteria, UA: NONE SEEN
Bilirubin Urine: NEGATIVE
Glucose, UA: NEGATIVE mg/dL
Hgb urine dipstick: NEGATIVE
Ketones, ur: NEGATIVE mg/dL
Leukocytes,Ua: NEGATIVE
Nitrite: NEGATIVE
Protein, ur: 30 mg/dL — AB
Specific Gravity, Urine: 1.012 (ref 1.005–1.030)
pH: 5 (ref 5.0–8.0)

## 2022-02-10 LAB — CBC WITH DIFFERENTIAL/PLATELET
Basophils Absolute: 0 10*3/uL (ref 0.0–0.1)
Basophils Relative: 0 %
Eosinophils Absolute: 0 10*3/uL (ref 0.0–0.5)
Eosinophils Relative: 0 %
HCT: 42.1 % (ref 36.0–46.0)
Hemoglobin: 11.9 g/dL — ABNORMAL LOW (ref 12.0–15.0)
Lymphocytes Relative: 66 %
Lymphs Abs: 1.7 10*3/uL (ref 0.7–4.0)
MCH: 28.3 pg (ref 26.0–34.0)
MCHC: 28.3 g/dL — ABNORMAL LOW (ref 30.0–36.0)
MCV: 100 fL (ref 80.0–100.0)
Monocytes Absolute: 0.8 10*3/uL (ref 0.1–1.0)
Monocytes Relative: 30 %
Neutro Abs: 0.1 10*3/uL — CL (ref 1.7–7.7)
Neutrophils Relative %: 4 %
Platelets: 51 10*3/uL — ABNORMAL LOW (ref 150–400)
RBC: 4.21 MIL/uL (ref 3.87–5.11)
RDW: 18.6 % — ABNORMAL HIGH (ref 11.5–15.5)
WBC: 2.5 10*3/uL — ABNORMAL LOW (ref 4.0–10.5)
nRBC: 2.4 % — ABNORMAL HIGH (ref 0.0–0.2)

## 2022-02-10 LAB — COMPREHENSIVE METABOLIC PANEL
ALT: 15 U/L (ref 0–44)
AST: 31 U/L (ref 15–41)
Albumin: 3.9 g/dL (ref 3.5–5.0)
Alkaline Phosphatase: 42 U/L (ref 38–126)
Anion gap: 7 (ref 5–15)
BUN: 31 mg/dL — ABNORMAL HIGH (ref 8–23)
CO2: 27 mmol/L (ref 22–32)
Calcium: 9.6 mg/dL (ref 8.9–10.3)
Chloride: 103 mmol/L (ref 98–111)
Creatinine, Ser: 1.03 mg/dL — ABNORMAL HIGH (ref 0.44–1.00)
GFR, Estimated: 53 mL/min — ABNORMAL LOW (ref >60)
Glucose, Bld: 107 mg/dL — ABNORMAL HIGH (ref 70–99)
Potassium: 4.4 mmol/L (ref 3.5–5.1)
Sodium: 137 mmol/L (ref 135–145)
Total Bilirubin: 0.7 mg/dL (ref 0.3–1.2)
Total Protein: 10.9 g/dL — ABNORMAL HIGH (ref 6.5–8.1)

## 2022-02-10 LAB — TSH: TSH: 1.99 u[IU]/mL (ref 0.350–4.500)

## 2022-02-10 MED ORDER — ACETAMINOPHEN 650 MG RE SUPP: 650.0000 mg | RECTAL | Status: DC | PRN

## 2022-02-10 MED ORDER — SODIUM CHLORIDE 1 G PO TABS
1.0000 g | ORAL_TABLET | Freq: Every day | ORAL | Status: DC
  Administered 2022-02-11: 1 g via ORAL
  Filled 2022-02-10 (×2): qty 1

## 2022-02-10 MED ORDER — ASPIRIN EC 81 MG PO TBEC
81.0000 mg | DELAYED_RELEASE_TABLET | Freq: Every day | ORAL | Status: DC
  Administered 2022-02-11: 81 mg via ORAL
  Filled 2022-02-10: qty 1

## 2022-02-10 MED ORDER — MIRTAZAPINE 15 MG PO TABS: 7.5000 mg | ORAL_TABLET | Freq: Every day | ORAL | Status: DC

## 2022-02-10 MED ORDER — ACETAMINOPHEN 325 MG PO TABS
650.0000 mg | ORAL_TABLET | ORAL | Status: DC | PRN
  Administered 2022-02-11 (×2): 650 mg via ORAL
  Filled 2022-02-10 (×3): qty 2

## 2022-02-10 MED ORDER — FUROSEMIDE 20 MG PO TABS
20.0000 mg | ORAL_TABLET | Freq: Every day | ORAL | Status: DC
  Administered 2022-02-11: 20 mg via ORAL
  Filled 2022-02-10: qty 1

## 2022-02-10 MED ORDER — CLOPIDOGREL BISULFATE 75 MG PO TABS
75.0000 mg | ORAL_TABLET | Freq: Every day | ORAL | Status: DC
  Administered 2022-02-11: 75 mg via ORAL
  Filled 2022-02-10: qty 1

## 2022-02-10 MED ORDER — STROKE: EARLY STAGES OF RECOVERY BOOK
Freq: Once | Status: DC
  Filled 2022-02-10: qty 1

## 2022-02-10 MED ORDER — UMECLIDINIUM BROMIDE 62.5 MCG/ACT IN AEPB
1.0000 | INHALATION_SPRAY | Freq: Every day | RESPIRATORY_TRACT | Status: DC
  Administered 2022-02-11: 1 via RESPIRATORY_TRACT
  Filled 2022-02-10: qty 7

## 2022-02-10 MED ORDER — MAGNESIUM OXIDE -MG SUPPLEMENT 400 (240 MG) MG PO TABS
200.0000 mg | ORAL_TABLET | Freq: Two times a day (BID) | ORAL | Status: DC
  Administered 2022-02-11: 200 mg via ORAL
  Filled 2022-02-10 (×2): qty 1

## 2022-02-10 MED ORDER — SODIUM CHLORIDE 0.9 % IV SOLN: INTRAVENOUS | Status: AC

## 2022-02-10 MED ORDER — ACETAMINOPHEN 160 MG/5ML PO SOLN: 650.0000 mg | ORAL | Status: DC | PRN

## 2022-02-10 MED ORDER — LEVOTHYROXINE SODIUM 100 MCG PO TABS
100.0000 ug | ORAL_TABLET | Freq: Every day | ORAL | Status: DC
  Administered 2022-02-11: 100 ug via ORAL
  Filled 2022-02-10: qty 1

## 2022-02-10 MED ORDER — SENNOSIDES-DOCUSATE SODIUM 8.6-50 MG PO TABS: 1.0000 | ORAL_TABLET | Freq: Every evening | ORAL | Status: DC | PRN

## 2022-02-10 NOTE — ED Triage Notes (Signed)
Patient via EMS due to altered mental status. Family states that she was confused after waking up from taking a nap. Patient is able to answer questions at present.  ?

## 2022-02-10 NOTE — ED Notes (Signed)
Purewick was placed @1935  ? ?

## 2022-02-10 NOTE — ED Provider Notes (Signed)
Discussed with Dr Evangeline Gula.  Will see patient for a admission, observation, TIA workup ?  ?Lisa Rank, MD ?02/10/22 1647 ? ?

## 2022-02-10 NOTE — ED Provider Notes (Signed)
?[facility] ?Provider Note ? ? ?CSN: [id number] ?Arrival date & time: 02/10/22  1356 ? ?  ? ?History ? ?Chief Complaint  ?Patient presents with  ? Altered Mental Status  ? ? ?Lisa [name] is a 88 y.o. female. ? ? ?Altered Mental Status ?Presenting symptoms: confusion   ?Associated symptoms: headaches and weakness (Generalized)   ?Patient presents for altered mental status.  She arrives from home via EMS.  Family reports that she took a nap earlier this afternoon.  When she woke up from her nap, she appeared confused and had difficult time answering questions.  When EMS arrived on scene, they report that the patient was conversant and alert and oriented.  She remained that way during transit.  She initially endorsed a headache but states that this has resolved.  Patient's medical history includes breast cancer, lung cancer, CKD, anemia, COPD, gout, hypothyroidism, HTN, arthritis, atrial fibrillation, and MDS.  She is followed by Dr. Raliegh Ip for oncology.  She was last seen at his office 1 month ago.  Current treatment includes Aranesp only.  Home medications include Synthroid, and Remeron. ?  ? ?Home Medications ?Prior to Admission medications   ?Medication Sig Start Date End Date Taking? Authorizing Provider  ?acetaminophen (TYLENOL) 325 MG tablet Take 2 tablets (650 mg total) by mouth every 6 (six) hours. 11/11/20  Yes Angiulli, Lavon Paganini, PA-C  ?furosemide (LASIX) 20 MG tablet Take 1 tablet (20 mg total) by mouth every other day. ?Patient taking differently: Take 20 mg by mouth daily. 09/04/21  Yes Johnson, Clanford L, MD  ?levothyroxine (SYNTHROID) 100 MCG tablet Take 1 tablet (100 mcg total) by mouth daily before breakfast. 08/28/21  Yes Johnson, Clanford L, MD  ?loperamide (IMODIUM) 2 MG capsule Take by mouth as needed for diarrhea or loose stools.   Yes [provider]  ?magnesium oxide (MAG-OX) 400 MG tablet Take 250 mg by mouth 2 (two) times daily.   Yes [provider]   ?metoprolol succinate (TOPROL-XL) 50 MG 24 hr tablet Take 50 mg by mouth daily. 12/30/21  Yes [provider]  ?mirtazapine (REMERON) 7.5 MG tablet Take 1 tablet (7.5 mg total) by mouth at bedtime. 05/28/21  Yes Heath Lark, MD  ?Multiple Vitamin (MULTI-VITAMIN) tablet Take 1 tablet by mouth daily.   Yes [provider]  ?OXYGEN Inhale 2 L into the lungs at bedtime.   Yes [provider]  ?sodium chloride 1 g tablet Take 1 g by mouth daily. 11/05/21  Yes [provider]  ?umeclidinium bromide (INCRUSE ELLIPTA) 62.5 MCG/INH AEPB Inhale into the lungs daily. 12/31/20  Yes [provider]  ?polyethylene glycol (MIRALAX / GLYCOLAX) 17 g packet Take 17 g by mouth daily as needed for moderate constipation. ?Patient not taking: Reported on 02/10/2022 10/30/20   Kayleen Memos, DO  ?senna-docusate (SENOKOT-S) 8.6-50 MG tablet Take 1 tablet by mouth at bedtime. ?Patient not taking: Reported on 02/10/2022 11/11/20   Cathlyn Parsons, PA-C  ?Vitamin D, Ergocalciferol, (DRISDOL) 1.25 MG (50000 UNIT) CAPS capsule Take 50,000 Units by mouth once a week. ?Patient not taking: Reported on 01/15/2022 01/10/21   [provider]  ?   ? ?Allergies    ?Meloxicam and Micardis hct [telmisartan-hctz]   ? ?Review of Systems   ?Review of Systems  ?Neurological:  Positive for speech difficulty, weakness (Generalized) and headaches.  ?Psychiatric/Behavioral:  Positive for confusion.   ?All other systems reviewed and are negative. ? ?Physical Exam ?Updated Vital Signs ?BP Marland Kitchen)  128/49 (BP Location: Right Arm)   Pulse 61   Temp 98.2 ?F (36.8 ?C)   Resp 16   Ht 5\' 6"  (1.676 m)   Wt 55.1 kg   SpO2 97%   BMI 19.61 kg/m?  ?Physical Exam ?Vitals and nursing note reviewed.  ?Constitutional:   ?   General: She is not in acute distress. ?   Appearance: Normal appearance. She is well-developed and normal weight. She is not ill-appearing, toxic-appearing or diaphoretic.  ?HENT:  ?   Head: Normocephalic and  atraumatic.  ?   Right Ear: External ear normal.  ?   Left Ear: External ear normal.  ?   Nose: Nose normal.  ?   Mouth/Throat:  ?   Mouth: Mucous membranes are moist.  ?   Pharynx: Oropharynx is clear.  ?Eyes:  ?   General: No visual field deficit. ?   Extraocular Movements: Extraocular movements intact.  ?   Conjunctiva/sclera: Conjunctivae normal.  ?Cardiovascular:  ?   Rate and Rhythm: Normal rate and regular rhythm.  ?   Heart sounds: No murmur heard. ?Pulmonary:  ?   Effort: Pulmonary effort is normal. No respiratory distress.  ?   Breath sounds: Normal breath sounds. No wheezing or rales.  ?Abdominal:  ?   Palpations: Abdomen is soft.  ?   Tenderness: There is no abdominal tenderness.  ?Musculoskeletal:     ?   General: No swelling. Normal range of motion.  ?   Cervical back: Normal range of motion and neck supple.  ?Skin: ?   General: Skin is warm and dry.  ?   Capillary Refill: Capillary refill takes less than 2 seconds.  ?Neurological:  ?   General: No focal deficit present.  ?   Mental Status: She is alert and oriented to person, place, and time.  ?   Cranial Nerves: No cranial nerve deficit, dysarthria or facial asymmetry.  ?   Sensory: Sensation is intact. No sensory deficit.  ?   Motor: Motor function is intact. No weakness, abnormal muscle tone or pronator drift.  ?   Coordination: Coordination is intact. Finger-Nose-Finger Test normal.  ?Psychiatric:     ?   Mood and Affect: Mood normal.     ?   Behavior: Behavior normal.     ?   Thought Content: Thought content normal.     ?   Judgment: Judgment normal.  ? ? ?ED Results / Procedures / Treatments   ?Labs ?(all labs ordered are listed, but only abnormal results are displayed) ?Labs Reviewed  ?COMPREHENSIVE METABOLIC PANEL - Abnormal; Notable for the following components:  ?    Result Value  ? Glucose, Bld 107 (*)   ? BUN 31 (*)   ? Creatinine, Ser 1.03 (*)   ? Total Protein 10.9 (*)   ? GFR, Estimated 53 (*)   ? All other components within normal  limits  ?CBC WITH DIFFERENTIAL/PLATELET - Abnormal; Notable for the following components:  ? WBC 2.5 (*)   ? Hemoglobin 11.9 (*)   ? MCHC 28.3 (*)   ? RDW 18.6 (*)   ? Platelets 51 (*)   ? nRBC 2.4 (*)   ? Neutro Abs 0.1 (*)   ? All other components within normal limits  ?URINALYSIS, ROUTINE W REFLEX MICROSCOPIC - Abnormal; Notable for the following components:  ? Protein, ur 30 (*)   ? All other components within normal limits  ?HEMOGLOBIN A1C - Abnormal; Notable for the following components:  ?  Hgb A1c MFr Bld 4.4 (*)   ? All other components within normal limits  ?LIPID PANEL - Abnormal; Notable for the following components:  ? HDL 31 (*)   ? All other components within normal limits  ?CBC - Abnormal; Notable for the following components:  ? WBC 2.6 (*)   ? RBC 3.86 (*)   ? Hemoglobin 10.7 (*)   ? MCHC 28.6 (*)   ? RDW 18.2 (*)   ? Platelets 49 (*)   ? nRBC 2.3 (*)   ? All other components within normal limits  ?COMPREHENSIVE METABOLIC PANEL - Abnormal; Notable for the following components:  ? Total Protein 9.1 (*)   ? Albumin 3.4 (*)   ? Alkaline Phosphatase 36 (*)   ? All other components within normal limits  ?TSH  ? ? ?EKG ?EKG Interpretation ? ?Date/Time:  Tuesday February 10 2022 16:03:59 EDT ?Ventricular Rate:  69 ?PR Interval:  66 ?QRS Duration: 84 ?QT Interval:  432 ?QTC Calculation: 463 ?R Axis:   -1 ?Text Interpretation: Sinus rhythm Atrial premature complex Short PR interval Probable left atrial enlargement RSR' in V1 or V2, right VCD or RVH Left ventricular hypertrophy No significant change since last tracing Confirmed by Dorie Rank [phone]) on 02/10/2022 4:11:10 PM ? ?Radiology ?MR ANGIO HEAD WO CONTRAST ? ?Result Date: 02/11/2022 ?CLINICAL DATA:  TIA. EXAM: MRA HEAD WITHOUT CONTRAST TECHNIQUE: Angiographic images of the Circle of Willis were acquired using MRA technique without intravenous contrast. COMPARISON:  No pertinent prior exam. FINDINGS: The study is mildly motion degraded. Anterior circulation:  The internal carotid arteries are patent from skull base to carotid termini without evidence of significant stenosis. ACAs and MCAs are patent without evidence of a proximal branch occlusion or significant proximal s

## 2022-02-10 NOTE — H&P (Signed)
?History and Physical  ? ? ?Lisa [name] WUJ:[id number] DOB: Apr 28, 1934 DOA: 02/10/2022 ? ?PCP: Celene Squibb, MD  ?Patient coming from: Home ? ?I have personally briefly reviewed patient's old medical records in [place] ? ?Chief Complaint: Aphasia, confusion, she woke up from her nap this afternoon ? ?HPI: Lisa [name] is a 88 y.o. female with medical history significant of CKD stage IIIb, COPD, hyperglycemia, chronic atrial fibrillation, hypothyroidism, myelodysplastic syndrome and history of breast and lung cancers who presents to the emergency department today at the behest of her family after she woke up confused with difficulty answering questions.  By the time patient arrived to the emergency department her symptoms were resolved and they were noted to be resolved on route by EMS as well.  The patient is currently awake and alert with full speech.  Reports no new weakness or difficulty with ambulation.  She reports that she is usually independent of all of her ADLs. ? ?ED Course: Treat as above physical examination unremarkable laboratory data consistent with myelodysplastic syndrome, Alysis unremarkable, MRI did not show an acute stroke, case discussed with neurology who recommended patient to [facility] for TIA stroke evaluation by the medicine team.  Patient then referred to [place]. ? ?Review of Systems: As per HPI otherwise all other systems reviewed and  negative.  ? ?Past Medical History:  ?Diagnosis Date  ? Adenocarcinoma of left lung ([place]) 2006  ? Arthritis   ? Asthma   ? Cancer of breast, female [facility])   ? Cancer of lung ([place])   ? Hypertension   ? Hypothyroidism   ? Invasive ductal carcinoma of left breast ([place]) 1999  ? Neutropenia ([place]) 06/09/2016  ? Personal history of radiation therapy   ? ? ?Past Surgical History:  ?Procedure Laterality Date  ? BIOPSY  10/23/2020  ? Procedure: BIOPSY;  Surgeon: Yetta Flock, MD;  Location: [facility] ENDOSCOPY;  Service: Gastroenterology;;  ? BREAST BIOPSY     ? left axillary node dissection  ? BREAST LUMPECTOMY Left   ? COLONOSCOPY  03/08/2003  ? NFA:OZHYQMVHQI rectal polyps destroyed with the tip of the snare/Polyps at hepatic flexure, splenic flexure at 35 cm/Left-sided diverticula: unable to retrieve path  ? COLONOSCOPY  09/12/2008  ? ONG:EXBMWU rectum and distal sigmoid diminutive polyps/scattered left sided diverticulum. hyperplastic  ? COLONOSCOPY N/A 03/06/2013  ? XLK:GMWNUUV polyp-removed as described above; colonic diverticulosis. hyperplastic polyps. next TCS 03/2018  ? ESOPHAGOGASTRODUODENOSCOPY (EGD) WITH PROPOFOL N/A 10/23/2020  ? Procedure: ESOPHAGOGASTRODUODENOSCOPY (EGD) WITH PROPOFOL;  Surgeon: Yetta Flock, MD;  Location: [place];  Service: Gastroenterology;  Laterality: N/A;  ? FOOT SURGERY    ? LUNG REMOVAL, PARTIAL    ? upper lobe  ? ? ?Social History  ? ?Social History Narrative  ? Not on file  ? ? ? reports that she quit smoking about 30 years ago. Her smoking use included cigarettes. She has a 26.25 pack-year smoking history. She has never used smokeless tobacco. She reports that she does not drink alcohol and does not use drugs. ? ?Allergies  ?Allergen Reactions  ? Meloxicam Other (See Comments)  ?  Caused an injury to the kidneys, per nephrologist  ? Micardis Hct [Telmisartan-Hctz] Other (See Comments)  ?  Caused an injury to the kidneys, per nephrologist  ? ? ?Family History  ?Problem Relation Age of Onset  ? Cancer Mother   ? Cancer Sister   ? Colon cancer Neg Hx   ? ? ? ?Prior to Admission medications   ?  Medication Sig Start Date End Date Taking? Authorizing Provider  ?acetaminophen (TYLENOL) 325 MG tablet Take 2 tablets (650 mg total) by mouth every 6 (six) hours. 11/11/20   Angiulli, Lavon Paganini, PA-C  ?dronabinol (MARINOL) 2.5 MG capsule Take 1 capsule (2.5 mg total) by mouth 2 (two) times daily before a meal. ?Patient not taking: Reported on 01/15/2022 09/01/21   Derek Jack, MD  ?furosemide (LASIX) 20 MG tablet Take 1  tablet (20 mg total) by mouth every other day. 09/04/21   Murlean Iba, MD  ?levothyroxine (SYNTHROID) 100 MCG tablet Take 1 tablet (100 mcg total) by mouth daily before breakfast. 08/28/21   Johnson, Clanford L, MD  ?loratadine (CLARITIN) 10 MG tablet Take 10 mg by mouth at bedtime as needed. ?Patient not taking: Reported on 01/15/2022 01/18/21   [provider]  ?magnesium oxide (MAG-OX) 400 MG tablet Take by mouth.    [provider]  ?metoprolol succinate (TOPROL-XL) 50 MG 24 hr tablet Take 50 mg by mouth daily. 12/30/21   [provider]  ?mirtazapine (REMERON) 7.5 MG tablet Take 1 tablet (7.5 mg total) by mouth at bedtime. 05/28/21   Heath Lark, MD  ?Multiple Vitamin (MULTI-VITAMIN) tablet Take 1 tablet by mouth daily.    [provider]  ?polyethylene glycol (MIRALAX / GLYCOLAX) 17 g packet Take 17 g by mouth daily as needed for moderate constipation. ?Patient not taking: Reported on 01/15/2022 10/30/20   Kayleen Memos, DO  ?senna-docusate (SENOKOT-S) 8.6-50 MG tablet Take 1 tablet by mouth at bedtime. ?Patient not taking: Reported on 01/15/2022 11/11/20   Angiulli, Lavon Paganini, PA-C  ?sodium chloride 1 g tablet Take 1 g by mouth daily. 11/05/21   [provider]  ?umeclidinium bromide (INCRUSE ELLIPTA) 62.5 MCG/INH AEPB Inhale into the lungs daily. 12/31/20   [provider]  ?Vitamin D, Ergocalciferol, (DRISDOL) 1.25 MG (50000 UNIT) CAPS capsule Take 50,000 Units by mouth once a week. ?Patient not taking: Reported on 01/15/2022 01/10/21   [provider]  ? ? ?Physical Exam: ? ?Constitutional: NAD, calm, comfortable ?Vitals:  ? 02/10/22 1519 02/10/22 1600 02/10/22 1630 02/10/22 1700  ?BP:  (!) 202/90 (!) 189/90 (!) 194/87  ?Pulse: 72 70 70 70  ?Resp: 20 19 20  (!) 22  ?Temp:      ?TempSrc:      ?SpO2: 100% 97% 94% 94%  ?Weight:      ?Height:      ? ?Eyes: PERRL, lids and conjunctivae normal ?ENMT: Mucous membranes are moist. Posterior pharynx clear of  any exudate or lesions.Normal dentition.  ?Neck: normal, supple, no masses, no thyromegaly ?Respiratory: clear to auscultation bilaterally, no wheezing, no crackles. Normal respiratory effort. No accessory muscle use.  ?Cardiovascular: Regular rate and rhythm, 3/6 SEmurmur/no rubs / gallops. No extremity edema. 2+ pedal pulses. No carotid bruits.  ?Abdomen: no tenderness, no masses palpated. No hepatosplenomegaly. Bowel sounds positive.  ?Musculoskeletal: no clubbing / cyanosis. No joint deformity upper and lower extremities. Good ROM, no contractures.  Poor muscle tone.  ?Skin: no rashes, lesions, ulcers. No induration ?Neurologic: CN 2-12 grossly intact. Sensation intact, DTR normal. Strength 4/5 in all 4.  More weak in the upper extremities than lower extremity ?Psychiatric: Normal judgment and insight. Alert and oriented x 3. Normal mood.  ? ? ?Labs on Admission: I have personally reviewed following labs and imaging studies ? ?CBC: ?Recent Labs  ?Lab 02/10/22 ?1433  ?WBC 2.5*  ?NEUTROABS 0.1*  ?HGB 11.9*  ?HCT 42.1  ?MCV  100.0  ?PLT 51*  ? ?Basic Metabolic Panel: ?Recent Labs  ?Lab 02/10/22 ?1433  ?NA 137  ?K 4.4  ?CL 103  ?CO2 27  ?GLUCOSE 107*  ?BUN 31*  ?CREATININE 1.03*  ?CALCIUM 9.6  ? ?GFR: ?Estimated Creatinine Clearance: 35.7 mL/min (A) (by C-G formula based on SCr of 1.03 mg/dL (H)). ?Liver Function Tests: ?Recent Labs  ?Lab 02/10/22 ?1433  ?AST 31  ?ALT 15  ?ALKPHOS 42  ?BILITOT 0.7  ?PROT 10.9*  ?ALBUMIN 3.9  ? ?Thyroid Function Tests: ?Recent Labs  ?  02/10/22 ?1433  ?TSH 1.990  ? ?Urine analysis: ?   ?Component Value Date/Time  ? COLORURINE YELLOW 02/10/2022 1608  ? APPEARANCEUR CLEAR 02/10/2022 1608  ? LABSPEC 1.012 02/10/2022 1608  ? PHURINE 5.0 02/10/2022 1608  ? GLUCOSEU NEGATIVE 02/10/2022 1608  ? [place] NEGATIVE 02/10/2022 1608  ? [place] NEGATIVE 02/10/2022 1608  ? [place] NEGATIVE 02/10/2022 1608  ? PROTEINUR 30 (A) 02/10/2022 1608  ? NITRITE NEGATIVE 02/10/2022 1608  ? LEUKOCYTESUR  NEGATIVE 02/10/2022 1608  ? ? ?Radiological Exams on Admission: ?MR BRAIN WO CONTRAST ? ?Result Date: 02/10/2022 ?CLINICAL DATA:  Provided history: Transient ischemic attack. EXAM: MRI HEAD WITHOUT CONTRAST [facility]

## 2022-02-10 NOTE — ED Notes (Addendum)
Pt's daughter left to go home. Call with any concerns ? ?

## 2022-02-10 NOTE — Progress Notes (Signed)
Contacted by EDP about this patient who had a transient episode of aphasia and gait instability lasting 10 minutes, now resolved. Event lasted approx 10 minutes. C/f TIA, recommend the following: ? ?- Admit to [facility] for stroke/TIA workup ?- Permissive HTN x48 hrs from sx onset or until stroke ruled out by MRI goal BP <220/110. PRN labetalol or hydralazine if BP above these parameters. Avoid oral antihypertensives. ?- MRI brain wo contrast ?- CTA or MRA H&N ?- TTE w/ bubble ?- Check A1c and LDL + add statin per guidelines ?- ASA 81mg  daily + plavix 75mg  daily x21 days f/b ASA 81mg  daily monotherapy after that ?- q4 hr neuro checks ?- STAT head CT for any change in neuro exam ?- Tele ?- PT/OT/SLP ?- Stroke education ?- Amb referral to neurology upon discharge  ? ?If additional neurologic questions arise during the hospitalization please place routine consult to teleneurologist Dr. Hortense Ramal. ? ?Su Monks, MD ?[facility] ?[phone] ? ?If 7pm- 7am, please page neurology on call as listed in [place]. ? ?

## 2022-02-10 NOTE — ED Notes (Signed)
Returned from MRI 

## 2022-02-11 ENCOUNTER — Observation Stay (HOSPITAL_COMMUNITY): Payer: Medicare Other

## 2022-02-11 ENCOUNTER — Observation Stay (HOSPITAL_BASED_OUTPATIENT_CLINIC_OR_DEPARTMENT_OTHER): Payer: Medicare Other

## 2022-02-11 DIAGNOSIS — I5033 Acute on chronic diastolic (congestive) heart failure: Secondary | ICD-10-CM | POA: Diagnosis present

## 2022-02-11 DIAGNOSIS — N1832 Chronic kidney disease, stage 3b: Secondary | ICD-10-CM

## 2022-02-11 DIAGNOSIS — D469 Myelodysplastic syndrome, unspecified: Secondary | ICD-10-CM

## 2022-02-11 DIAGNOSIS — Q283 Other malformations of cerebral vessels: Secondary | ICD-10-CM | POA: Diagnosis not present

## 2022-02-11 DIAGNOSIS — E039 Hypothyroidism, unspecified: Secondary | ICD-10-CM | POA: Diagnosis not present

## 2022-02-11 DIAGNOSIS — G459 Transient cerebral ischemic attack, unspecified: Secondary | ICD-10-CM

## 2022-02-11 DIAGNOSIS — I6502 Occlusion and stenosis of left vertebral artery: Secondary | ICD-10-CM | POA: Diagnosis not present

## 2022-02-11 DIAGNOSIS — I5032 Chronic diastolic (congestive) heart failure: Secondary | ICD-10-CM | POA: Diagnosis present

## 2022-02-11 LAB — ECHOCARDIOGRAM COMPLETE
AR max vel: 0.99 cm2
AV Area VTI: 1.05 cm2
AV Area mean vel: 0.99 cm2
AV Mean grad: 18 mmHg
AV Peak grad: 33.8 mmHg
Ao pk vel: 2.91 m/s
Area-P 1/2: 2.75 cm2
Calc EF: 61.4 %
Height: 66 in
MV VTI: 2.44 cm2
S' Lateral: 2.4 cm
Single Plane A2C EF: 69.5 %
Single Plane A4C EF: 52.8 %
Weight: 1943.58 oz

## 2022-02-11 LAB — COMPREHENSIVE METABOLIC PANEL
ALT: 12 U/L (ref 0–44)
AST: 27 U/L (ref 15–41)
Albumin: 3.4 g/dL — ABNORMAL LOW (ref 3.5–5.0)
Alkaline Phosphatase: 36 U/L — ABNORMAL LOW (ref 38–126)
Anion gap: 8 (ref 5–15)
BUN: 23 mg/dL (ref 8–23)
CO2: 25 mmol/L (ref 22–32)
Calcium: 9.2 mg/dL (ref 8.9–10.3)
Chloride: 102 mmol/L (ref 98–111)
Creatinine, Ser: 0.72 mg/dL (ref 0.44–1.00)
GFR, Estimated: >60 mL/min (ref >60)
Glucose, Bld: 80 mg/dL (ref 70–99)
Potassium: 4 mmol/L (ref 3.5–5.1)
Sodium: 135 mmol/L (ref 135–145)
Total Bilirubin: 0.8 mg/dL (ref 0.3–1.2)
Total Protein: 9.1 g/dL — ABNORMAL HIGH (ref 6.5–8.1)

## 2022-02-11 LAB — HEMOGLOBIN A1C
Hgb A1c MFr Bld: 4.4 % — ABNORMAL LOW (ref 4.8–5.6)
Mean Plasma Glucose: 79.58 mg/dL

## 2022-02-11 LAB — LIPID PANEL
Cholesterol: 106 mg/dL (ref 0–200)
HDL: 31 mg/dL — ABNORMAL LOW (ref >40)
LDL Cholesterol: 65 mg/dL (ref 0–99)
Total CHOL/HDL Ratio: 3.4 RATIO
Triglycerides: 49 mg/dL (ref <150)
VLDL: 10 mg/dL (ref 0–40)

## 2022-02-11 LAB — CBC
HCT: 37.4 % (ref 36.0–46.0)
Hemoglobin: 10.7 g/dL — ABNORMAL LOW (ref 12.0–15.0)
MCH: 27.7 pg (ref 26.0–34.0)
MCHC: 28.6 g/dL — ABNORMAL LOW (ref 30.0–36.0)
MCV: 96.9 fL (ref 80.0–100.0)
Platelets: 49 10*3/uL — ABNORMAL LOW (ref 150–400)
RBC: 3.86 MIL/uL — ABNORMAL LOW (ref 3.87–5.11)
RDW: 18.2 % — ABNORMAL HIGH (ref 11.5–15.5)
WBC: 2.6 10*3/uL — ABNORMAL LOW (ref 4.0–10.5)
nRBC: 2.3 % — ABNORMAL HIGH (ref 0.0–0.2)

## 2022-02-11 MED ORDER — GABAPENTIN 100 MG PO CAPS: 100.0000 mg | ORAL_CAPSULE | Freq: Two times a day (BID) | ORAL | 1 refills | Status: DC | PRN

## 2022-02-11 MED ORDER — ASPIRIN 81 MG PO TBEC: 81.0000 mg | DELAYED_RELEASE_TABLET | Freq: Every day | ORAL | 11 refills | Status: DC

## 2022-02-11 NOTE — TOC Transition Note (Signed)
Transition of Care (TOC) - CM/SW Discharge Note ? ? ?Patient Details  ?Name: Lisa [name] ?MRN: [id number] ?Date of Birth: [birth year] ? ?Transition of Care (TOC) CM/SW Contact:  ?Salome Arnt, LCSW ?Phone Number: ?02/11/2022, 1:04 PM ? ? ?Clinical Narrative:  PT/OT recommending home health. Discussed with pt who defers to daughter, Lisa [name]. Lisa [name] agreeable to home health with no preference on agency. Referred and accepted by [facility] with [facility]. Pt has all needed DME at home per daughter. No other needs reported. Anticipate d/c later today.    ? ? ? ?Final next level of care: [place] ?Barriers to Discharge: Barriers Resolved ? ? ?Patient Goals and CMS Choice ?Patient states their goals for this hospitalization and ongoing recovery are:: return home ?  ?Choice offered to / list presented to : Patient, Adult Children ? ?Discharge Placement ?  ?           ?  ?  ?  ?  ? ?Discharge Plan and Services ?  ?  ?           ?  ?  ?  ?  ?  ?HH Arranged: PT, OT ?[place] Agency: [place] ([place]) ?Date HH Agency Contacted: 02/11/22 ?Time [place]: 3532 ?Representative spoke with at [place]: Vaughan Basta ? ?Social Determinants of Health (SDOH) Interventions ?  ? ? ?Readmission Risk Interventions ? ?  08/28/2021  ?  1:06 PM  ?Readmission Risk Prevention Plan  ?Transportation Screening Complete  ?[place] or Home Care Consult Complete  ?Social Work Consult for [place] Planning/Counseling Complete  ?Palliative Care Screening Not Applicable  ?Medication Review Press photographer) Complete  ? ? ? ? ? ?

## 2022-02-11 NOTE — Plan of Care (Signed)
?  Problem: Acute Rehab PT Goals(only PT should resolve) ?Goal: Pt Will Go Supine/Side To Sit ?Outcome: Progressing ?Flowsheets (Taken 02/11/2022 1346) ?Pt will go Supine/Side to Sit: ? with modified independence ? Independently ?Goal: Patient Will Transfer Sit To/From Stand ?Outcome: Progressing ?Flowsheets (Taken 02/11/2022 1346) ?Patient will transfer sit to/from stand: with modified independence ?Goal: Pt Will Transfer Bed To Chair/Chair To Bed ?Outcome: Progressing ?Flowsheets (Taken 02/11/2022 1346) ?Pt will Transfer Bed to Chair/Chair to Bed: with modified independence ?Goal: Pt Will Ambulate ?Outcome: Progressing ?Flowsheets (Taken 02/11/2022 1346) ?Pt will Ambulate: ? 100 feet ? with supervision ? with rolling walker ?  ?1:47 PM, 02/11/22 ?Lonell Grandchild, MPT ?Physical Therapist with [place] ?[facility] ?[phone] office ?[phone] mobile phone ? ?

## 2022-02-11 NOTE — Progress Notes (Signed)
*  PRELIMINARY RESULTS* ?Echocardiogram ?2D Echocardiogram has been performed. ? ?Lisa [name] ?02/11/2022, 1:07 PM ?

## 2022-02-11 NOTE — Assessment & Plan Note (Signed)
-   Adequate nutrition and hydration has been encouraged. ?-Continue to follow nutritional status and use feeding supplements. ?

## 2022-02-11 NOTE — Evaluation (Signed)
Physical Therapy Evaluation ?Patient Details ?Name: Lisa [name] ?MRN: [id number] ?DOB: Jan 02, 1934 ?Today's Date: 02/11/2022 ? ?History of Present Illness ? Lisa [name] is a 88 y.o. female with medical history significant of CKD stage IIIb, COPD, hyperglycemia, chronic atrial fibrillation, hypothyroidism, myelodysplastic syndrome and history of breast and lung cancers who presents to the emergency department today at the behest of her family after she woke up confused with difficulty answering questions.  By the time patient arrived to the emergency department her symptoms were resolved and they were noted to be resolved on route by EMS as well.  The patient is currently awake and alert with full speech.  Reports no new weakness or difficulty with ambulation.  She reports that she is usually independent of all of her ADLs. ?  ?Clinical Impression ? Patient functioning near baseline and required hand held assist for short distances in room when not using an AD, safer using RW with slightly labored cadence without loss of balance, on 2 LPM O2 with SpO2 at 95-98% during ambulation and limited mostly due to c/o fatigue.  Patient tolerated sitting up in chair after therapy - RN aware.  Patient will benefit from continued skilled physical therapy in hospital and recommended venue below to increase strength, balance, endurance for safe ADLs and gait.  ?   ? ?Recommendations for follow up therapy are one component of a multi-disciplinary discharge planning process, led by the attending physician.  Recommendations may be updated based on patient status, additional functional criteria and insurance authorization. ? ?Follow Up Recommendations Home health PT ? ?  ?Assistance Recommended at Discharge Set up Supervision/Assistance  ?Patient can return home with the following ? A little help with walking and/or transfers;A little help with bathing/dressing/bathroom;Help with stairs or ramp for entrance;Assistance with  cooking/housework ? ?  ?Equipment Recommendations None recommended by PT  ?Recommendations for Other Services ?    ?  ?Functional Status Assessment Patient has had a recent decline in their functional status and demonstrates the ability to make significant improvements in function in a reasonable and predictable amount of time.  ? ?  ?Precautions / Restrictions Precautions ?Precautions: Fall ?Restrictions ?Weight Bearing Restrictions: No  ? ?  ? ?Mobility ? Bed Mobility ?Overal bed mobility: Modified Independent ?  ?  ?  ?  ?  ?  ?  ?  ? ?Transfers ?Overall transfer level: Needs assistance ?Equipment used: Rolling walker (2 wheels), None ?Transfers: Sit to/from Stand, Bed to chair/wheelchair/BSC ?Sit to Stand: Min guard, Supervision ?  ?Step pivot transfers: Supervision, Min guard ?  ?  ?  ?General transfer comment: required hand held assist during sit to stands/transfers without AD, safer using RW ?  ? ?Ambulation/Gait ?Ambulation/Gait assistance: Min guard ?Gait Distance (Feet): 65 Feet ?Assistive device: Rolling walker (2 wheels) ?Gait Pattern/deviations: Decreased step length - right, Decreased step length - left, Decreased stride length ?Gait velocity: decreased ?  ?  ?General Gait Details: required hand held assist for short distances in room, safer using RW with slightly labored cadence without loss  of balance, on 2 LPM O2 with SpO2 at 95-98% during ambulation ? ?Stairs ?  ?  ?  ?  ?  ? ?Wheelchair Mobility ?  ? ?Modified Rankin (Stroke Patients Only) ?  ? ?  ? ?Balance Overall balance assessment: Needs assistance ?Sitting-balance support: Feet supported, No upper extremity supported ?Sitting balance-Leahy Scale: Good ?Sitting balance - Comments: seated EOB ?  ?Standing balance support: During functional activity, No upper extremity supported ?  Standing balance-Leahy Scale: Poor ?Standing balance comment: fair/poor without AD, fair/good using RW ?  ?  ?  ?  ?  ?  ?  ?  ?  ?  ?  ?   ? ? ? ?Pertinent  Vitals/Pain Pain Assessment ?Pain Assessment: No/denies pain  ? ? ?Home Living Family/patient expects to be discharged to:: Private residence ?Living Arrangements: Children ?Available Help at Discharge: Available PRN/intermittently;Family ?Type of Home: House ?Home Access: Stairs to enter ?Entrance Stairs-Rails:  (unsure/difficulty communitcating) ?Entrance Stairs-Number of Steps: 4 ?Alternate Level Stairs-Number of Steps: 14 ?Home Layout: Two level ?Home Equipment: Conservation officer, nature (2 wheels);Cane - quad;BSC/3in1;Grab bars - tub/shower;Wheelchair - manual;Other (comment) ?Additional Comments: Pt reports living with daughter but daughter not present 24/7. Pt on supplemental O2 at home.  ?  ?Prior Function Prior Level of Function : Needs assist ? Cognitive Assist : Mobility (cognitive) ?  ?  ?Physical Assist : Mobility (physical);ADLs (physical) ?Mobility (physical): Transfers;Gait;Bed mobility;Stairs ?ADLs (physical): IADLs;Bathing ?Mobility Comments: Pt reports household ambulation with RW and use of quad cane at grocery store. ?ADLs Comments: Assist needed for bathing. Independnet goorming, feeding, dressing, toileting. Assted for IADL's by family. ?  ? ? ?Hand Dominance  ? Dominant Hand: Right ? ?  ?Extremity/Trunk Assessment  ? Upper Extremity Assessment ?Upper Extremity Assessment: Defer to OT evaluation ?RUE Deficits / Details: 2+/5 shoulder flexion and abduction; WFL P/ROM of flexion and abduction. 3+/5 elbow extension, 4-/5 elbow flexion, 3+/5 grip strength. WFL A/ROM of wrist and digits. ?RUE Sensation: decreased light touch (R upper arm.) ?RUE Coordination: WNL ?LUE Deficits / Details: 2+/5 shoulder flexion and abduction; WFL P/ROM of flexion and abduction. 3+/5 elbow extension, 4-/5 elbow flexion, 3+/5 grip strength. WFL A/ROM of wrist and digits. ?LUE Sensation: WNL ?LUE Coordination: WNL ?  ? ?Lower Extremity Assessment ?Lower Extremity Assessment: Overall WFL for tasks assessed ?  ? ?Cervical / Trunk  Assessment ?Cervical / Trunk Assessment: Kyphotic  ?Communication  ? Communication: HOH  ?Cognition Arousal/Alertness: Awake/alert ?Behavior During Therapy: [facility] for tasks assessed/performed ?Overall Cognitive Status: No family/caregiver present to determine baseline cognitive functioning ?  ?  ?  ?  ?  ?  ?  ?  ?  ?  ?  ?  ?  ?  ?  ?  ?  ?  ?  ? ?  ?General Comments   ? ?  ?Exercises    ? ?Assessment/Plan  ?  ?PT Assessment Patient needs continued PT services  ?PT Problem List Decreased strength;Decreased activity tolerance;Decreased balance;Decreased mobility ? ?   ?  ?PT Treatment Interventions DME instruction;Gait training;Stair training;Functional mobility training;Therapeutic activities;Therapeutic exercise;Balance training;Patient/family education   ? ?PT Goals (Current goals can be found in the Care Plan section)  ?Acute Rehab PT Goals ?Patient Stated Goal: return home with family to assist ?PT Goal Formulation: With patient ?Time For Goal Achievement: 02/14/22 ?Potential to Achieve Goals: Good ? ?  ?Frequency Min 3X/week ?  ? ? ?Co-evaluation PT/OT/SLP Co-Evaluation/Treatment: Yes ?Reason for Co-Treatment: To address functional/ADL transfers ?PT goals addressed during session: Mobility/safety with mobility;Balance;Proper use of DME ?OT goals addressed during session: ADL's and self-care ?  ? ? ?  ?AM-PAC PT "6 Clicks" Mobility  ?Outcome Measure Help needed turning from your back to your side while in a flat bed without using bedrails?: None ?Help needed moving from lying on your back to sitting on the side of a flat bed without using bedrails?: None ?Help needed moving to and from a bed to a  chair (including a wheelchair)?: A Little ?Help needed standing up from a chair using your arms (e.g., wheelchair or bedside chair)?: A Little ?Help needed to walk in hospital room?: A Little ?Help needed climbing 3-5 steps with a railing? : A Little ?6 Click Score: 20 ? ?  ?End of Session Equipment Utilized During  Treatment: Oxygen ?Activity Tolerance: Patient tolerated treatment well ?Patient left: in chair;with call bell/phone within reach ?Nurse Communication: Mobility status ?PT Visit Diagnosis: Unsteadiness on feet (R26.81);Other abn

## 2022-02-11 NOTE — Plan of Care (Signed)
?  Problem: Acute Rehab OT Goals (only OT should resolve) ?Goal: Pt. Will Perform Grooming ?Flowsheets (Taken 02/11/2022 1109) ?Pt Will Perform Grooming: ? with modified independence ? standing ?Goal: Pt. Will Transfer To Toilet ?Flowsheets (Taken 02/11/2022 1109) ?Pt Will Transfer to Toilet: ? with modified independence ? ambulating ?Goal: Pt/Caregiver Will Perform Home Exercise Program ?Flowsheets (Taken 02/11/2022 1109) ?Pt/caregiver will Perform Home Exercise Program: ? Increased ROM ? Increased strength ? Both right and left upper extremity ? Independently ? Baylie Drakes OT, MOT ? ?

## 2022-02-11 NOTE — Assessment & Plan Note (Signed)
-   Grade 1 diastolic dysfunction ?-Continue to watch weight on daily basis and sodium intake ?-No signs of fluid overload currently ?-Overall condition is compensated. ?

## 2022-02-11 NOTE — Assessment & Plan Note (Signed)
-   Continue the use of Synthroid. 

## 2022-02-11 NOTE — Assessment & Plan Note (Signed)
-  Chronic in the setting of MDS ?-Hematology oncology service. ?

## 2022-02-11 NOTE — Discharge Summary (Signed)
?Physician Discharge Summary ?  ?Patient: Lisa [name] MRN: [id number] DOB: 05-05-1934  ?Admit date:     02/10/2022  ?Discharge date: 02/11/22  ?Discharge Physician: Barton Dubois  ? ?PCP: Celene Squibb, MD  ? ?Recommendations at discharge:  ?Repeat BMET to follow electrolytes and renal function. ?Reassess BP and restart antihypertensive regimen as required. ?Make sure patient has follow-up with neurology service as instructed. ? ?Discharge Diagnoses: ?Principal Problem: ?  TIA (transient ischemic attack) ?Active Problems: ?  Neutropenia ([place]) ?  Hyperglycemia ?  MDS (myelodysplastic syndrome) ([place]) ?  Hypothyroidism ?  Chronic renal failure (CRF), stage 3b ([place]) ?  HTN (hypertension) ?  Malnutrition of moderate degree ?  Chronic diastolic CHF (congestive heart failure) ([place]) ?Grade 1 chronic diastolic dysfunction. ? ?Brief Hospital admission narrative course: ?As per H&P written by Dr. Evangeline Gula on 02/10/2022 ?Lisa [name] is a 88 y.o. female with medical history significant of CKD stage IIIb, COPD, hyperglycemia, chronic atrial fibrillation, hypothyroidism, myelodysplastic syndrome and history of breast and lung cancers who presents to the emergency department today at the behest of her family after she woke up confused with difficulty answering questions.  By the time patient arrived to the emergency department her symptoms were resolved and they were noted to be resolved on route by EMS as well.  The patient is currently awake and alert with full speech.  Reports no new weakness or difficulty with ambulation.  She reports that she is usually independent of all of her ADLs. ?  ?ED Course: Treat as above physical examination unremarkable laboratory data consistent with myelodysplastic syndrome, [place] unremarkable, MRI did not show an acute stroke, case discussed with neurology who recommended patient to [facility] for TIA stroke evaluation by the medicine team.  Patient then referred to [place]. ? ?Assessment and  Plan: ?Assessment and Plan: ?* TIA (transient ischemic attack) ?- No acute stroke appreciated on MRI ?-MRI demonstrating multiple vessels occlusion and high-grade stenosis (being the most affected the left vertebral artery) ?-No residual deficits appreciated ?-Prior to admission patient was no using anticoagulation or any secondary prevention medication. ?-Due to her underlying MDS condition she is chronically thrombocytopenic and leukopenic; increasing risk for bleeding and bad outcome. ?-After curbside and with neurology service recommendations given to use aspirin on daily basis for secondary prevention ?-Outpatient follow-up with neurology in 4-6 weeks has been recommended. ?-Continue risk factor modifications. ? ?Hyperglycemia ?-Continue to follow CBGs fluctuation and treatment treatment initiation as indicated. ?-Patient advised to maintain adequate hydration. ? ?Neutropenia ([place]) ?-Chronic in the setting of MDS ?-Hematology oncology service. ? ?Hypothyroidism ?- Continue the use of Synthroid. ? ?Chronic diastolic CHF (congestive heart failure) ([place]) ?- Grade 1 diastolic dysfunction ?-Continue to watch weight on daily basis and sodium intake ?-No signs of fluid overload currently ?-Overall condition is compensated. ? ?Malnutrition of moderate degree ?- Adequate nutrition and hydration has been encouraged. ?-Continue to follow nutritional status and use feeding supplements. ? ?HTN (hypertension) ?-Permissive hypertension has been allowed ?-Plan is to keep blood pressure range ?-Assess vital signs at follow-up visit with initiation of antihypertensive treatment as prior to admission patient was not using any medications high risk for hypotension, dehydration and orthostatic changes. ? ?Chronic renal failure (CRF), stage 3b ([place]) ?- Patient advised to maintain adequate hydration ?-Continue minimizing nephrotoxic agent ?-Repeat basic metabolic panel follow-up visit ?-Overall appears to be close to baseline  currently. ? ? ? ?Consultants: Neurology curbside. ?Procedures performed: See below for x-ray reports.  2D echo with grade 1  diastolic dysfunction, mild hypertrophy, no significant valvular disorder, no wall motion abnormalities and preserved ejection fraction. ?Disposition: Discharged home with home health services. ?Diet recommendation:  ?Discharge Diet Orders (From admission, onward)  ? ?  Start     Ordered  ? 02/11/22 0000  Diet - low sodium heart healthy       ? 02/11/22 1734  ? ?  ?  ? ?  ? ?DISCHARGE MEDICATION: ?Allergies as of 02/11/2022   ? ?   Reactions  ? Meloxicam Other (See Comments)  ? Caused an injury to the kidneys, per nephrologist  ? Micardis Hct [telmisartan-hctz] Other (See Comments)  ? Caused an injury to the kidneys, per nephrologist  ? ?  ? ?  ?Medication List  ?  ? ?STOP taking these medications   ? ?furosemide 20 MG tablet ?Commonly known as: LASIX ?  ?loperamide 2 MG capsule ?Commonly known as: IMODIUM ?  ?metoprolol succinate 50 MG 24 hr tablet ?Commonly known as: TOPROL-XL ?  ?polyethylene glycol 17 g packet ?Commonly known as: MIRALAX / GLYCOLAX ?  ? ?  ? ?TAKE these medications   ? ?acetaminophen 325 MG tablet ?Commonly known as: TYLENOL ?Take 2 tablets (650 mg total) by mouth every 6 (six) hours. ?  ?aspirin 81 MG EC tablet ?Take 1 tablet (81 mg total) by mouth daily. Swallow whole. ?Start taking on: February 12, 2022 ?  ?gabapentin 100 MG capsule ?Commonly known as: Neurontin ?Take 1 capsule (100 mg total) by mouth every 12 (twelve) hours as needed (neuropathy). ?  ?Incruse Ellipta 62.5 MCG/ACT Aepb ?Generic drug: umeclidinium bromide ?Inhale into the lungs daily. ?  ?levothyroxine 100 MCG tablet ?Commonly known as: SYNTHROID ?Take 1 tablet (100 mcg total) by mouth daily before breakfast. ?  ?magnesium oxide 400 MG tablet ?Commonly known as: MAG-OX ?Take 250 mg by mouth 2 (two) times daily. ?  ?mirtazapine 7.5 MG tablet ?Commonly known as: REMERON ?Take 1 tablet (7.5 mg total) by mouth  at bedtime. ?  ?Multi-Vitamin tablet ?Take 1 tablet by mouth daily. ?  ?OXYGEN ?Inhale 2 L into the lungs at bedtime. ?  ?senna-docusate 8.6-50 MG tablet ?Commonly known as: Senokot-S ?Take 1 tablet by mouth at bedtime. ?  ?sodium chloride 1 g tablet ?Take 1 g by mouth daily. ?  ?Vitamin D (Ergocalciferol) 1.25 MG (50000 UNIT) Caps capsule ?Commonly known as: DRISDOL ?Take 50,000 Units by mouth once a week. ?  ? ?  ? ? Follow-up Information   ? ? Celene Squibb, MD .   ?Specialty: Internal Medicine ?Contact information: ?[address] ?[place] ?[place] [address] ?[phone] ? ? ?  ?  ? ? [facility]-Home Follow up.   ?Specialty: Home Health Services ?Why: Will contact you to schedule home health visits. ? ?  ?  ? ? Phillips Odor, MD. Schedule an appointment as soon as possible for a visit in 4 week(s).   ?Specialty: Neurology ?Contact information: ?[address] ?[place] [address] ?[phone] ? ? ?  ?  ? ?  ?  ? ?  ? ?Discharge Exam: ?Filed Weights  ? 02/10/22 1401 02/10/22 2300  ?Weight: 56.7 kg 55.1 kg  ? ?General exam: Alert, awake, oriented x 3; no focal deficits appreciated.  Patient chronically ill in appearance and very frail.  No complaining of chest pain, palpitations, nausea or vomiting. ?Respiratory system: Clear to auscultation. Respiratory effort normal.  No using accessory muscle. ?Cardiovascular system:Rate controlled. No rubs or gallops.  No JVD  on exam. ?Gastrointestinal system: Abdomen is nondistended, soft and nontender. No organomegaly or masses felt. Normal bowel sounds heard. ?Central nervous system: Alert and oriented. No focal neurological deficits. ?Extremities: No cyanosis or clubbing. ?Skin: No petechiae. ?Psychiatry: Judgement and insight appear normal. Mood & affect appropriate.  ? ? ?Condition at discharge: Stable and improved. ? ?The results of significant diagnostics from this hospitalization (including imaging, microbiology, ancillary and laboratory) are listed below  for reference.  ? ?Imaging Studies: ?MR ANGIO HEAD WO CONTRAST ? ?Result Date: 02/11/2022 ?CLINICAL DATA:  TIA. EXAM: MRA HEAD WITHOUT CONTRAST TECHNIQUE: Angiographic images of the [place] were acquired usin

## 2022-02-11 NOTE — Care Management Obs Status (Signed)
MEDICARE OBSERVATION STATUS NOTIFICATION ? ? ?Patient Details  ?Name: Lisa [name] ?MRN: [id number] ?Date of Birth: 1934/05/08 ? ? ?Medicare Observation Status Notification Given:  Yes ? ? ? ?Salome Arnt, LCSW ?02/11/2022, 1:02 PM ?

## 2022-02-11 NOTE — Assessment & Plan Note (Signed)
-   No acute stroke appreciated on MRI ?-MRI demonstrating multiple vessels occlusion and high-grade stenosis (being the most affected the left vertebral artery) ?-No residual deficits appreciated ?-Prior to admission patient was no using anticoagulation or any secondary prevention medication. ?-Due to her underlying MDS condition she is chronically thrombocytopenic and leukopenic; increasing risk for bleeding and bad outcome. ?-After curbside and with neurology service recommendations given to use aspirin on daily basis for secondary prevention ?-Outpatient follow-up with neurology in 4-6 weeks has been recommended. ?-Continue risk factor modifications. ?

## 2022-02-11 NOTE — Assessment & Plan Note (Signed)
-  Permissive hypertension has been allowed ?-Plan is to keep blood pressure range ?-Assess vital signs at follow-up visit with initiation of antihypertensive treatment as prior to admission patient was not using any medications high risk for hypotension, dehydration and orthostatic changes. ?

## 2022-02-11 NOTE — Assessment & Plan Note (Signed)
-   Patient advised to maintain adequate hydration ?-Continue minimizing nephrotoxic agent ?-Repeat basic metabolic panel follow-up visit ?-Overall appears to be close to baseline currently. ?

## 2022-02-11 NOTE — Progress Notes (Signed)
SLP Cancellation Note ? ?Patient Details ?Name: Lisa [name] ?MRN: [id number] ?DOB: 10/17/1934 ? ? ?Cancelled treatment:       Reason Eval/Treat Not Completed: SLP screened, no needs identified, will sign off; SLP screened Pt in room. Pt/family denies any changes in swallowing, speech, language, or cognition. MRI negative for acute changes. Family indicates that Pt is back to baseline. SLE will be deferred at this time. Reconsult if indicated. SLP will sign off. ? ? ?Thank you, ? ?Genene Churn, [place] ?[phone] ? ?Lisa [name] ?02/11/2022, 2:10 PM ?

## 2022-02-11 NOTE — Assessment & Plan Note (Signed)
-  Continue to follow CBGs fluctuation and treatment treatment initiation as indicated. ?-Patient advised to maintain adequate hydration. ?

## 2022-02-11 NOTE — Evaluation (Signed)
Occupational Therapy Evaluation ?Patient Details ?Name: Lisa [name] ?MRN: [id number] ?DOB: [birth year] ?Today's Date: 02/11/2022 ? ? ?History of Present Illness Lisa [name] is a 88 y.o. female with medical history significant of CKD stage IIIb, COPD, hyperglycemia, chronic atrial fibrillation, hypothyroidism, myelodysplastic syndrome and history of breast and lung cancers who presents to the emergency department today at the behest of her family after she woke up confused with difficulty answering questions.  By the time patient arrived to the emergency department her symptoms were resolved and they were noted to be resolved on route by EMS as well.  The patient is currently awake and alert with full speech.  Reports no new weakness or difficulty with ambulation.  She reports that she is usually independent of all of her ADLs. (per MD)  ? ?Clinical Impression ?  ?Pt agreeable to OT and PT co-evaluation. Pt presents with good sitting balance and modified independence for bed mobility. Pt demonstrates B UE weakness with limited A/ROM of shoulders and general weakness. Pt also reports some deficits in sensation to R UE around the upper arm area. In standing and transfers pt requires min G assist using RW due to balance deficits. Pt was left in chair with call bell within reach. Pt will benefit from continued OT in the hospital and recommended venue below to increase strength, balance, and endurance for safe ADL's.  ? ? ?   ? ?Recommendations for follow up therapy are one component of a multi-disciplinary discharge planning process, led by the attending physician.  Recommendations may be updated based on patient status, additional functional criteria and insurance authorization.  ? ?Follow Up Recommendations ? Home health OT  ?  ?Assistance Recommended at Discharge Intermittent Supervision/Assistance  ?Patient can return home with the following A little help with walking and/or transfers;A little help with  bathing/dressing/bathroom;Assist for transportation;Help with stairs or ramp for entrance ? ?  ?Functional Status Assessment ? Patient has had a recent decline in their functional status and demonstrates the ability to make significant improvements in function in a reasonable and predictable amount of time.  ?Equipment Recommendations ? None recommended by OT  ?  ?Recommendations for Other Services   ? ? ?  ?Precautions / Restrictions Precautions ?Precautions: Fall ?Restrictions ?Weight Bearing Restrictions: No  ? ?  ? ?Mobility Bed Mobility ?Overal bed mobility: Modified Independent ?  ?  ?  ?  ?  ?  ?General bed mobility comments: Mild labored movement ?  ? ?Transfers ?Overall transfer level: Needs assistance ?Equipment used: Rolling walker (2 wheels) ?Transfers: Sit to/from Stand, Bed to chair/wheelchair/BSC ?Sit to Stand: Min guard, Supervision ?  ?  ?Step pivot transfers: Supervision, Min guard ?  ?  ?General transfer comment: Mild labored movement with RW. ?  ? ?  ?Balance Overall balance assessment: Needs assistance ?Sitting-balance support: No upper extremity supported, Feet supported ?Sitting balance-Leahy Scale: Good ?Sitting balance - Comments: seated EOB ?  ?Standing balance support: Bilateral upper extremity supported, During functional activity ?Standing balance-Leahy Scale: Fair ?Standing balance comment: using RW ?  ?  ?  ?  ?  ?  ?  ?  ?  ?  ?  ?   ? ?ADL either performed or assessed with clinical judgement  ? ?ADL Overall ADL's : Needs assistance/impaired ?Eating/Feeding: Independent ?  ?Grooming: Standing;Min guard ?  ?  ?  ?Lower Body Bathing: Supervison/ safety;Sit to/from stand;Sitting/lateral leans ?  ?Upper Body Dressing : Modified independent;Sitting ?  ?Lower Body Dressing: Modified independent;Sitting/lateral leans ?  Lower Body Dressing Details (indicate cue type and reason): Able to doff and don socks seated at EOB ?Toilet Transfer: Min guard;Rolling walker (2 wheels);Ambulation ?Toilet  Transfer Details (indicate cue type and reason): Simulated via EOB to chair transfer and ambulation in hall. ?  ?  ?  ?  ?Functional mobility during ADLs: Min guard;Rolling walker (2 wheels) ?   ? ? ? ?Vision Baseline Vision/History: 1 Wears glasses ?Ability to See in Adequate Light: 1 Impaired ?Patient Visual Report: No change from baseline ?Vision Assessment?: No apparent visual deficits  ?   ?   ?  ?   ?  ? ?Pertinent Vitals/Pain Pain Assessment ?Pain Assessment: No/denies pain  ? ? ? ?Hand Dominance Right ?  ?Extremity/Trunk Assessment Upper Extremity Assessment ?Upper Extremity Assessment: RUE deficits/detail;LUE deficits/detail ?RUE Deficits / Details: 2+/5 shoulder flexion and abduction; WFL P/ROM of flexion and abduction. 3+/5 elbow extension, 4-/5 elbow flexion, 3+/5 grip strength. WFL A/ROM of wrist and digits. ?RUE Sensation: decreased light touch (R upper arm.) ?RUE Coordination: WNL ?LUE Deficits / Details: 2+/5 shoulder flexion and abduction; WFL P/ROM of flexion and abduction. 3+/5 elbow extension, 4-/5 elbow flexion, 3+/5 grip strength. WFL A/ROM of wrist and digits. ?LUE Sensation: WNL ?LUE Coordination: WNL ?  ?Lower Extremity Assessment ?Lower Extremity Assessment: Defer to PT evaluation ?  ?Cervical / Trunk Assessment ?Cervical / Trunk Assessment: Kyphotic ?  ?Communication Communication ?Communication: HOH ?  ?Cognition Arousal/Alertness: Awake/alert ?Behavior During Therapy: [facility] for tasks assessed/performed ?Overall Cognitive Status: No family/caregiver present to determine baseline cognitive functioning ?  ?  ?  ?  ?  ?  ?  ?  ?  ?  ?  ?  ?  ?  ?  ?  ?General Comments: Pt oriented 1/3. ?  ?  ?   ? ?  ?   ?  ?    ? ? ?Home Living Family/patient expects to be discharged to:: Private residence ?Living Arrangements: Children ?Available Help at Discharge: Available PRN/intermittently;Family ?Type of Home: House ?Home Access: Stairs to enter ?Entrance Stairs-Number of Steps: 4 ?Entrance  Stairs-Rails:  (unsure/difficulty communitcating) ?Home Layout: Two level ?Alternate Level Stairs-Number of Steps: 14 ?Alternate Level Stairs-Rails: Left (going up) ?Bathroom Shower/Tub: Tub/shower unit ?  ?Bathroom Toilet: Handicapped height ?Bathroom Accessibility: Yes ?How Accessible: Accessible via walker ?Home Equipment: Conservation officer, nature (2 wheels);Cane - quad;BSC/3in1;Grab bars - tub/shower;Wheelchair - Education administrator (comment) (Lift chair) ?  ?Additional Comments: Pt reports living with daughter but daughter not present 24/7. Pt on supplemental O2 at home. ?  ? ?  ?Prior Functioning/Environment Prior Level of Function : Needs assist ?  ?  ?  ?Physical Assist : Mobility (physical);ADLs (physical) ?Mobility (physical): Transfers;Gait ?ADLs (physical): IADLs;Bathing ?Mobility Comments: Pt reports household ambulation with RW and use of quad cane at grocery store. ?ADLs Comments: Assist needed for bathing. Independnet goorming, feeding, dressing, toileting. Assted for IADL's by family. ?  ? ?  ?  ?OT Problem List: Decreased strength;Decreased range of motion;Decreased activity tolerance;Impaired balance (sitting and/or standing);Impaired sensation ?  ?   ?OT Treatment/Interventions: Self-care/ADL training;Therapeutic exercise;DME and/or AE instruction;Therapeutic activities;Neuromuscular education;Patient/family education;Balance training  ?  ?OT Goals(Current goals can be found in the care plan section) Acute Rehab OT Goals ?Patient Stated Goal: return home ?OT Goal Formulation: With patient ?Time For Goal Achievement: 02/25/22 ?Potential to Achieve Goals: Good  ?OT Frequency: Min 1X/week ?  ? ?Co-evaluation PT/OT/SLP Co-Evaluation/Treatment: Yes ?Reason for Co-Treatment: To address functional/ADL transfers ?  ?OT goals addressed during session:  ADL's and self-care ?  ? ?  ?AM-PAC OT "6 Clicks" Daily Activity     ?Outcome Measure Help from another person eating meals?: None ?Help from another person taking care of  personal grooming?: None ?Help from another person toileting, which includes using toliet, bedpan, or urinal?: A Little ?Help from another person bathing (including washing, rinsing, drying)?: A Little ?Help from another perso

## 2022-02-12 ENCOUNTER — Inpatient Hospital Stay (HOSPITAL_COMMUNITY): Payer: Medicare Other | Attending: Internal Medicine

## 2022-02-12 ENCOUNTER — Inpatient Hospital Stay (HOSPITAL_COMMUNITY): Payer: Medicare Other

## 2022-02-18 DIAGNOSIS — I10 Essential (primary) hypertension: Secondary | ICD-10-CM | POA: Diagnosis not present

## 2022-02-18 DIAGNOSIS — G609 Hereditary and idiopathic neuropathy, unspecified: Secondary | ICD-10-CM | POA: Diagnosis not present

## 2022-02-18 DIAGNOSIS — D469 Myelodysplastic syndrome, unspecified: Secondary | ICD-10-CM | POA: Diagnosis not present

## 2022-02-18 DIAGNOSIS — N1832 Chronic kidney disease, stage 3b: Secondary | ICD-10-CM | POA: Diagnosis not present

## 2022-02-18 DIAGNOSIS — E039 Hypothyroidism, unspecified: Secondary | ICD-10-CM | POA: Diagnosis not present

## 2022-02-18 DIAGNOSIS — Z20822 Contact with and (suspected) exposure to covid-19: Secondary | ICD-10-CM | POA: Diagnosis not present

## 2022-02-18 DIAGNOSIS — Z758 Other problems related to medical facilities and other health care: Secondary | ICD-10-CM | POA: Diagnosis not present

## 2022-02-18 DIAGNOSIS — Z8673 Personal history of transient ischemic attack (TIA), and cerebral infarction without residual deficits: Secondary | ICD-10-CM | POA: Diagnosis not present

## 2022-03-09 DIAGNOSIS — Z20822 Contact with and (suspected) exposure to covid-19: Secondary | ICD-10-CM | POA: Diagnosis not present

## 2022-03-12 ENCOUNTER — Inpatient Hospital Stay (HOSPITAL_COMMUNITY): Payer: Medicare Other

## 2022-03-12 ENCOUNTER — Inpatient Hospital Stay (HOSPITAL_COMMUNITY): Payer: Medicare Other | Attending: Hematology

## 2022-03-12 VITALS — BP 138/71 | HR 87 | Temp 97.6°F | Resp 18

## 2022-03-12 DIAGNOSIS — D539 Nutritional anemia, unspecified: Secondary | ICD-10-CM

## 2022-03-12 DIAGNOSIS — G479 Sleep disorder, unspecified: Secondary | ICD-10-CM | POA: Diagnosis not present

## 2022-03-12 DIAGNOSIS — R200 Anesthesia of skin: Secondary | ICD-10-CM | POA: Diagnosis not present

## 2022-03-12 DIAGNOSIS — Z853 Personal history of malignant neoplasm of breast: Secondary | ICD-10-CM | POA: Insufficient documentation

## 2022-03-12 DIAGNOSIS — M2550 Pain in unspecified joint: Secondary | ICD-10-CM | POA: Insufficient documentation

## 2022-03-12 DIAGNOSIS — C3412 Malignant neoplasm of upper lobe, left bronchus or lung: Secondary | ICD-10-CM | POA: Insufficient documentation

## 2022-03-12 DIAGNOSIS — D4620 Refractory anemia with excess of blasts, unspecified: Secondary | ICD-10-CM | POA: Diagnosis present

## 2022-03-12 DIAGNOSIS — D469 Myelodysplastic syndrome, unspecified: Secondary | ICD-10-CM

## 2022-03-12 DIAGNOSIS — R059 Cough, unspecified: Secondary | ICD-10-CM | POA: Insufficient documentation

## 2022-03-12 DIAGNOSIS — R0602 Shortness of breath: Secondary | ICD-10-CM | POA: Diagnosis not present

## 2022-03-12 DIAGNOSIS — G629 Polyneuropathy, unspecified: Secondary | ICD-10-CM | POA: Insufficient documentation

## 2022-03-12 DIAGNOSIS — E871 Hypo-osmolality and hyponatremia: Secondary | ICD-10-CM | POA: Diagnosis not present

## 2022-03-12 DIAGNOSIS — Z79899 Other long term (current) drug therapy: Secondary | ICD-10-CM | POA: Insufficient documentation

## 2022-03-12 LAB — CBC WITH DIFFERENTIAL/PLATELET
Abs Immature Granulocytes: 0.01 10*3/uL (ref 0.00–0.07)
Basophils Absolute: 0 10*3/uL (ref 0.0–0.1)
Basophils Relative: 0 %
Eosinophils Absolute: 0 10*3/uL (ref 0.0–0.5)
Eosinophils Relative: 0 %
HCT: 33.6 % — ABNORMAL LOW (ref 36.0–46.0)
Hemoglobin: 9.5 g/dL — ABNORMAL LOW (ref 12.0–15.0)
Immature Granulocytes: 0 %
Lymphocytes Relative: 48 %
Lymphs Abs: 1.1 10*3/uL (ref 0.7–4.0)
MCH: 27 pg (ref 26.0–34.0)
MCHC: 28.3 g/dL — ABNORMAL LOW (ref 30.0–36.0)
MCV: 95.5 fL (ref 80.0–100.0)
Monocytes Absolute: 1 10*3/uL (ref 0.1–1.0)
Monocytes Relative: 44 %
Neutro Abs: 0.2 10*3/uL — CL (ref 1.7–7.7)
Neutrophils Relative %: 8 %
Platelets: 64 10*3/uL — ABNORMAL LOW (ref 150–400)
RBC: 3.52 MIL/uL — ABNORMAL LOW (ref 3.87–5.11)
RDW: 17.7 % — ABNORMAL HIGH (ref 11.5–15.5)
WBC: 2.6 10*3/uL — ABNORMAL LOW (ref 4.0–10.5)
nRBC: 2 % — ABNORMAL HIGH (ref 0.0–0.2)

## 2022-03-12 LAB — SAMPLE TO BLOOD BANK

## 2022-03-12 MED ORDER — DARBEPOETIN ALFA 200 MCG/0.4ML IJ SOSY
200.0000 ug | PREFILLED_SYRINGE | Freq: Once | INTRAMUSCULAR | Status: AC
  Administered 2022-03-12: 200 ug via SUBCUTANEOUS

## 2022-03-12 NOTE — Patient Instructions (Signed)
[facility]  Discharge Instructions: ?Thank you for choosing [facility] to provide your oncology and hematology care.  ?If you have a lab appointment with the [facility], please come in thru the Main Entrance and check in at the main information desk. ? ?Wear comfortable clothing and clothing appropriate for easy access to any Portacath or PICC line.  ? ?We strive to give you quality time with your provider. You may need to reschedule your appointment if you arrive late (15 or more minutes).  Arriving late affects you and other patients whose appointments are after yours.  Also, if you miss three or more appointments without notifying the office, you may be dismissed from the clinic at the provider?s discretion.    ?  ?For prescription refill requests, have your pharmacy contact our office and allow 72 hours for refills to be completed.   ? ?Today you received Aranesp injection. ?  ? ? ?BELOW ARE SYMPTOMS THAT SHOULD BE REPORTED IMMEDIATELY: ?*FEVER GREATER THAN 100.4 F (38 ?C) OR HIGHER ?*CHILLS OR SWEATING ?*NAUSEA AND VOMITING THAT IS NOT CONTROLLED WITH YOUR NAUSEA MEDICATION ?*UNUSUAL SHORTNESS OF BREATH ?*UNUSUAL BRUISING OR BLEEDING ?*URINARY PROBLEMS (pain or burning when urinating, or frequent urination) ?*BOWEL PROBLEMS (unusual diarrhea, constipation, pain near the anus) ?TENDERNESS IN MOUTH AND THROAT WITH OR WITHOUT PRESENCE OF ULCERS (sore throat, sores in mouth, or a toothache) ?UNUSUAL RASH, SWELLING OR PAIN  ?UNUSUAL VAGINAL DISCHARGE OR ITCHING  ? ?Items with * indicate a potential emergency and should be followed up as soon as possible or go to the Emergency Department if any problems should occur. ? ?Please show the CHEMOTHERAPY ALERT CARD or IMMUNOTHERAPY ALERT CARD at check-in to the Emergency Department and triage nurse. ? ?Should you have questions after your visit or need to cancel or reschedule your appointment, please contact [facility]  [phone]  and follow the prompts.  Office hours are 8:00 a.m. to 4:30 p.m. Monday - Friday. Please note that voicemails left after 4:00 p.m. may not be returned until the following business day.  We are closed weekends and major holidays. You have access to a nurse at all times for urgent questions. Please call the main number to the clinic [phone] and follow the prompts. ? ?For any non-urgent questions, you may also contact your provider using MyChart. We now offer e-Visits for anyone 104 and older to request care online for non-urgent symptoms. For details visit [email or web address]. ?  ?Also download the MyChart app! Go to the app store, search "MyChart", open the app, select [facility], and log in with your MyChart username and password. ? ?Due to Covid, a mask is required upon entering the hospital/clinic. If you do not have a mask, one will be given to you upon arrival. For doctor visits, patients may have 1 support person aged 15 or older with them. For treatment visits, patients cannot have anyone with them due to current Covid guidelines and our immunocompromised population.  ?

## 2022-03-12 NOTE — Progress Notes (Signed)
Lisa [name] presents today for injection per the provider's orders.  Aranesp administration without incident; injection site WNL; see [name] for injection details.  Patient tolerated procedure well and without incident.  No questions or complaints noted at this time. Pt's hemoglobin noted to be 9.5 today. ? ?Pt's ANC was 0.2 today. Message sent to R.Pennington-PA and she stated to proceed with Aranesp and to educate on neutropenic precautions. Pt also stated she was taking Aspirin 81 mg as directed. Pt educated on neutropenia precaution and verbalized understanding. ? ?Discharged from clinic ambulatory in stable condition. Alert and oriented x 3. F/U with [facility] as scheduled.   ?

## 2022-03-12 NOTE — Progress Notes (Signed)
CRITICAL VALUE ALERT ?Critical value received:  ANC 0.2 ?Date of notification:  03-12-2022 ?Time of notification: 12:01pm ?Critical value read back:  Yes.   ?Nurse who received alert:  Bpresnell RN ?MD notified time and response:  R. Pennington PA.   ?

## 2022-04-02 DIAGNOSIS — E538 Deficiency of other specified B group vitamins: Secondary | ICD-10-CM | POA: Diagnosis not present

## 2022-04-02 DIAGNOSIS — E039 Hypothyroidism, unspecified: Secondary | ICD-10-CM | POA: Diagnosis not present

## 2022-04-02 DIAGNOSIS — I10 Essential (primary) hypertension: Secondary | ICD-10-CM | POA: Diagnosis not present

## 2022-04-07 DIAGNOSIS — R197 Diarrhea, unspecified: Secondary | ICD-10-CM | POA: Diagnosis not present

## 2022-04-07 DIAGNOSIS — I4891 Unspecified atrial fibrillation: Secondary | ICD-10-CM | POA: Diagnosis not present

## 2022-04-07 DIAGNOSIS — E039 Hypothyroidism, unspecified: Secondary | ICD-10-CM | POA: Diagnosis not present

## 2022-04-07 DIAGNOSIS — D469 Myelodysplastic syndrome, unspecified: Secondary | ICD-10-CM | POA: Diagnosis not present

## 2022-04-07 DIAGNOSIS — R809 Proteinuria, unspecified: Secondary | ICD-10-CM | POA: Diagnosis not present

## 2022-04-07 DIAGNOSIS — R627 Adult failure to thrive: Secondary | ICD-10-CM | POA: Diagnosis not present

## 2022-04-07 DIAGNOSIS — G609 Hereditary and idiopathic neuropathy, unspecified: Secondary | ICD-10-CM | POA: Diagnosis not present

## 2022-04-07 DIAGNOSIS — N1832 Chronic kidney disease, stage 3b: Secondary | ICD-10-CM | POA: Diagnosis not present

## 2022-04-07 DIAGNOSIS — Z8673 Personal history of transient ischemic attack (TIA), and cerebral infarction without residual deficits: Secondary | ICD-10-CM | POA: Diagnosis not present

## 2022-04-07 DIAGNOSIS — I10 Essential (primary) hypertension: Secondary | ICD-10-CM | POA: Diagnosis not present

## 2022-04-09 ENCOUNTER — Inpatient Hospital Stay (HOSPITAL_COMMUNITY): Payer: Medicare Other | Attending: Hematology

## 2022-04-09 ENCOUNTER — Inpatient Hospital Stay (HOSPITAL_COMMUNITY): Payer: Medicare Other

## 2022-04-09 VITALS — BP 124/70 | HR 80 | Temp 96.8°F | Resp 18

## 2022-04-09 DIAGNOSIS — E871 Hypo-osmolality and hyponatremia: Secondary | ICD-10-CM | POA: Diagnosis not present

## 2022-04-09 DIAGNOSIS — Z85118 Personal history of other malignant neoplasm of bronchus and lung: Secondary | ICD-10-CM | POA: Insufficient documentation

## 2022-04-09 DIAGNOSIS — D539 Nutritional anemia, unspecified: Secondary | ICD-10-CM

## 2022-04-09 DIAGNOSIS — D469 Myelodysplastic syndrome, unspecified: Secondary | ICD-10-CM

## 2022-04-09 DIAGNOSIS — G629 Polyneuropathy, unspecified: Secondary | ICD-10-CM | POA: Diagnosis not present

## 2022-04-09 DIAGNOSIS — Z79899 Other long term (current) drug therapy: Secondary | ICD-10-CM | POA: Insufficient documentation

## 2022-04-09 DIAGNOSIS — Z888 Allergy status to other drugs, medicaments and biological substances status: Secondary | ICD-10-CM | POA: Insufficient documentation

## 2022-04-09 DIAGNOSIS — C9310 Chronic myelomonocytic leukemia not having achieved remission: Secondary | ICD-10-CM | POA: Diagnosis not present

## 2022-04-09 DIAGNOSIS — D4620 Refractory anemia with excess of blasts, unspecified: Secondary | ICD-10-CM | POA: Diagnosis not present

## 2022-04-09 DIAGNOSIS — Z79890 Hormone replacement therapy (postmenopausal): Secondary | ICD-10-CM | POA: Insufficient documentation

## 2022-04-09 DIAGNOSIS — Z853 Personal history of malignant neoplasm of breast: Secondary | ICD-10-CM | POA: Insufficient documentation

## 2022-04-09 LAB — CBC WITH DIFFERENTIAL/PLATELET
Band Neutrophils: 1 %
Basophils Absolute: 0 10*3/uL (ref 0.0–0.1)
Basophils Relative: 0 %
Eosinophils Absolute: 0 10*3/uL (ref 0.0–0.5)
Eosinophils Relative: 0 %
HCT: 35.4 % — ABNORMAL LOW (ref 36.0–46.0)
Hemoglobin: 10 g/dL — ABNORMAL LOW (ref 12.0–15.0)
Lymphocytes Relative: 51 %
Lymphs Abs: 1.7 10*3/uL (ref 0.7–4.0)
MCH: 28.3 pg (ref 26.0–34.0)
MCHC: 28.2 g/dL — ABNORMAL LOW (ref 30.0–36.0)
MCV: 100.3 fL — ABNORMAL HIGH (ref 80.0–100.0)
Monocytes Absolute: 1.4 10*3/uL — ABNORMAL HIGH (ref 0.1–1.0)
Monocytes Relative: 40 %
Neutro Abs: 0.3 10*3/uL — CL (ref 1.7–7.7)
Neutrophils Relative %: 8 %
Platelets: 91 10*3/uL — ABNORMAL LOW (ref 150–400)
RBC: 3.53 MIL/uL — ABNORMAL LOW (ref 3.87–5.11)
RDW: 18.8 % — ABNORMAL HIGH (ref 11.5–15.5)
WBC: 3.4 10*3/uL — ABNORMAL LOW (ref 4.0–10.5)
nRBC: 1 /100 WBC — ABNORMAL HIGH
nRBC: 1.5 % — ABNORMAL HIGH (ref 0.0–0.2)

## 2022-04-09 LAB — RETICULOCYTES
Immature Retic Fract: 18.1 % — ABNORMAL HIGH (ref 2.3–15.9)
RBC.: 3.51 MIL/uL — ABNORMAL LOW (ref 3.87–5.11)
Retic Count, Absolute: 60.7 10*3/uL (ref 19.0–186.0)
Retic Ct Pct: 1.7 % (ref 0.4–3.1)

## 2022-04-09 LAB — IRON AND TIBC
Iron: 42 ug/dL (ref 28–170)
Saturation Ratios: 14 % (ref 10.4–31.8)
TIBC: 292 ug/dL (ref 250–450)
UIBC: 250 ug/dL

## 2022-04-09 LAB — SAMPLE TO BLOOD BANK

## 2022-04-09 LAB — FERRITIN: Ferritin: 272 ng/mL (ref 11–307)

## 2022-04-09 LAB — LACTATE DEHYDROGENASE: LDH: 257 U/L — ABNORMAL HIGH (ref 98–192)

## 2022-04-09 MED ORDER — DARBEPOETIN ALFA 200 MCG/0.4ML IJ SOSY
200.0000 ug | PREFILLED_SYRINGE | Freq: Once | INTRAMUSCULAR | Status: AC
  Administered 2022-04-09: 200 ug via SUBCUTANEOUS
  Filled 2022-04-09: qty 0.4

## 2022-04-09 NOTE — Progress Notes (Signed)
Kera Deacon presents today for injection per the provider's orders.  Aranesp  administration without incident; injection site WNL; see [name] for injection details.  Patient tolerated procedure well and without incident.  No questions or complaints noted at this time. Pt's hemoglobin noted 10 today.  Discharged from clinic ambulatory via wheelchair in stable condition. Alert and oriented x 3. F/U with [facility] as scheduled.

## 2022-04-09 NOTE — Progress Notes (Signed)
CRITICAL VALUE ALERT Critical value received:  [place] Date of notification:  04/09/2022 Time of notification: 10:20 am.  Critical value read back:  Yes.   Nurse who received alert:  B. Lovell Roe RN.  MD notified time and response: Dr. Delton Coombes, 10:28am. No new orders received.

## 2022-04-09 NOTE — Patient Instructions (Signed)
[facility]  Discharge Instructions: Thank you for choosing [facility] to provide your oncology and hematology care.  If you have a lab appointment with the [facility], please come in thru the Main Entrance and check in at the main information desk.  Wear comfortable clothing and clothing appropriate for easy access to any Portacath or PICC line.   We strive to give you quality time with your provider. You may need to reschedule your appointment if you arrive late (15 or more minutes).  Arriving late affects you and other patients whose appointments are after yours.  Also, if you miss three or more appointments without notifying the office, you may be dismissed from the clinic at the provider's discretion.      For prescription refill requests, have your pharmacy contact our office and allow 72 hours for refills to be completed.    Today you received the following chemotherapy and/or immunotherapy agents Aranesp     BELOW ARE SYMPTOMS THAT SHOULD BE REPORTED IMMEDIATELY: *FEVER GREATER THAN 100.4 F (38 C) OR HIGHER *CHILLS OR SWEATING *NAUSEA AND VOMITING THAT IS NOT CONTROLLED WITH YOUR NAUSEA MEDICATION *UNUSUAL SHORTNESS OF BREATH *UNUSUAL BRUISING OR BLEEDING *URINARY PROBLEMS (pain or burning when urinating, or frequent urination) *BOWEL PROBLEMS (unusual diarrhea, constipation, pain near the anus) TENDERNESS IN MOUTH AND THROAT WITH OR WITHOUT PRESENCE OF ULCERS (sore throat, sores in mouth, or a toothache) UNUSUAL RASH, SWELLING OR PAIN  UNUSUAL VAGINAL DISCHARGE OR ITCHING   Items with * indicate a potential emergency and should be followed up as soon as possible or go to the Emergency Department if any problems should occur.  Please show the CHEMOTHERAPY ALERT CARD or IMMUNOTHERAPY ALERT CARD at check-in to the Emergency Department and triage nurse.  Should you have questions after your visit or need to cancel or reschedule your appointment,  please contact [facility] [phone]  and follow the prompts.  Office hours are 8:00 a.m. to 4:30 p.m. Monday - Friday. Please note that voicemails left after 4:00 p.m. may not be returned until the following business day.  We are closed weekends and major holidays. You have access to a nurse at all times for urgent questions. Please call the main number to the clinic [phone] and follow the prompts.  For any non-urgent questions, you may also contact your provider using MyChart. We now offer e-Visits for anyone 50 and older to request care online for non-urgent symptoms. For details visit [email or web address].   Also download the MyChart app! Go to the app store, search "MyChart", open the app, select [facility], and log in with your MyChart username and password.  Due to Covid, a mask is required upon entering the hospital/clinic. If you do not have a mask, one will be given to you upon arrival. For doctor visits, patients may have 1 support person aged 54 or older with them. For treatment visits, patients cannot have anyone with them due to current Covid guidelines and our immunocompromised population.

## 2022-04-16 ENCOUNTER — Inpatient Hospital Stay (HOSPITAL_BASED_OUTPATIENT_CLINIC_OR_DEPARTMENT_OTHER): Payer: Medicare Other | Admitting: Hematology

## 2022-04-16 ENCOUNTER — Other Ambulatory Visit (HOSPITAL_COMMUNITY): Payer: Self-pay | Admitting: *Deleted

## 2022-04-16 VITALS — BP 141/73 | HR 102 | Temp 98.8°F | Resp 18 | Wt 131.6 lb

## 2022-04-16 DIAGNOSIS — D469 Myelodysplastic syndrome, unspecified: Secondary | ICD-10-CM

## 2022-04-16 DIAGNOSIS — C9310 Chronic myelomonocytic leukemia not having achieved remission: Secondary | ICD-10-CM | POA: Diagnosis not present

## 2022-04-16 DIAGNOSIS — Z79890 Hormone replacement therapy (postmenopausal): Secondary | ICD-10-CM | POA: Diagnosis not present

## 2022-04-16 DIAGNOSIS — D4620 Refractory anemia with excess of blasts, unspecified: Secondary | ICD-10-CM | POA: Diagnosis not present

## 2022-04-16 DIAGNOSIS — G629 Polyneuropathy, unspecified: Secondary | ICD-10-CM | POA: Diagnosis not present

## 2022-04-16 DIAGNOSIS — E871 Hypo-osmolality and hyponatremia: Secondary | ICD-10-CM | POA: Diagnosis not present

## 2022-04-16 DIAGNOSIS — D539 Nutritional anemia, unspecified: Secondary | ICD-10-CM

## 2022-04-16 DIAGNOSIS — Z79899 Other long term (current) drug therapy: Secondary | ICD-10-CM | POA: Diagnosis not present

## 2022-04-16 NOTE — Patient Instructions (Signed)
[facility] at [facility] Discharge Instructions   You were seen and examined today by Dr. Delton Coombes.  He reviewed the results of your lab work which is stable. Your hemoglobin has improved to 10. We will continue Aranesp injections every 4 weeks. The next injection will be due in 3 weeks.  Return as scheduled for lab work and injections.  Return as scheduled for office visit.    Thank you for choosing [facility] to provide your oncology and hematology care.  To afford each patient quality time with our provider, please arrive at least 15 minutes before your scheduled appointment time.   If you have a lab appointment with the [facility] please come in thru the Main Entrance and check in at the main information desk.  You need to re-schedule your appointment should you arrive 10 or more minutes late.  We strive to give you quality time with our providers, and arriving late affects you and other patients whose appointments are after yours.  Also, if you no show three or more times for appointments you may be dismissed from the clinic at the providers discretion.     Again, thank you for choosing [facility].  Our hope is that these requests will decrease the amount of time that you wait before being seen by our physicians.       _____________________________________________________________  Should you have questions after your visit to [facility], please contact our office at [phone] and follow the prompts.  Our office hours are 8:00 a.m. and 4:30 p.m. Monday - Friday.  Please note that voicemails left after 4:00 p.m. may not be returned until the following business day.  We are closed weekends and major holidays.  You do have access to a nurse 24-7, just call the main number to the clinic [phone] and do not press any options, hold on the line and a nurse will answer the phone.    For  prescription refill requests, have your pharmacy contact our office and allow 72 hours.    Due to Covid, you will need to wear a mask upon entering the hospital. If you do not have a mask, a mask will be given to you at the Main Entrance upon arrival. For doctor visits, patients may have 1 support person age 70 or older with them. For treatment visits, patients can not have anyone with them due to social distancing guidelines and our immunocompromised population.

## 2022-04-16 NOTE — Progress Notes (Signed)
[facility] [address] [place] [address]   Patient Care Team: Celene Squibb, MD as PCP - General (Internal Medicine) [name], MD as Consulting Physician (Gastroenterology)  SUMMARY OF ONCOLOGIC HISTORY: Oncology History Overview Note  Called Dr Juel Burrow office again now, office closed. Pt will see Dr Delton Coombes and I will ask him if he can talk to Dr Nevada Crane after his visit.  Thanks,   Adenocarcinoma of left lung ([place])  06/23/2016 Imaging   CT chest- Stable exam.  No new or progressive findings. Previously described tiny perifissural nodules at the base of the right major fissure are stable and likely represent subpleural lymph nodes.     CHIEF COMPLIANT: Follow-up for MDS and left breast cancer   INTERVAL HISTORY: [name] is a 88 y.o. female here today for follow up of her MDS and left breast cancer. Her last visit was on 01/15/2022.   Today she reports feeling good. She denies hematochezia and black stools. She reports she had a UTI.  REVIEW OF SYSTEMS:   Review of Systems  Constitutional:  Negative for appetite change and fatigue.  Gastrointestinal:  Positive for diarrhea.  Musculoskeletal:  Positive for arthralgias.  All other systems reviewed and are negative.   I have reviewed the past medical history, past surgical history, social history and family history with the patient and they are unchanged from previous note.   ALLERGIES:   is allergic to meloxicam and micardis hct [telmisartan-hctz].   MEDICATIONS:  Current Outpatient Medications  Medication Sig Dispense Refill   acetaminophen (TYLENOL) 325 MG tablet Take 2 tablets (650 mg total) by mouth every 6 (six) hours.     aspirin EC 81 MG EC tablet Take 1 tablet (81 mg total) by mouth daily. Swallow whole. 30 tablet 11   gabapentin (NEURONTIN) 100 MG capsule Take 1 capsule (100 mg total) by mouth every 12 (twelve) hours as needed (neuropathy). 60 capsule 1   levothyroxine  (SYNTHROID) 100 MCG tablet Take 1 tablet (100 mcg total) by mouth daily before breakfast. 30 tablet 1   magnesium oxide (MAG-OX) 400 MG tablet Take 250 mg by mouth 2 (two) times daily.     mirtazapine (REMERON) 7.5 MG tablet Take 1 tablet (7.5 mg total) by mouth at bedtime. 30 tablet 2   Multiple Vitamin (MULTI-VITAMIN) tablet Take 1 tablet by mouth daily.     OXYGEN Inhale 2 L into the lungs at bedtime.     senna-docusate (SENOKOT-S) 8.6-50 MG tablet Take 1 tablet by mouth at bedtime.     sodium chloride 1 g tablet Take 1 g by mouth daily.     umeclidinium bromide (INCRUSE ELLIPTA) 62.5 MCG/INH AEPB Inhale into the lungs daily.     Vitamin D, Ergocalciferol, (DRISDOL) 1.25 MG (50000 UNIT) CAPS capsule Take 50,000 Units by mouth once a week.     No current facility-administered medications for this visit.     PHYSICAL EXAMINATION: Performance status (ECOG): 3 - Symptomatic, >50% confined to bed  Vitals:   04/16/22 1134  BP: (!) 141/73  Pulse: (!) 102  Resp: 18  Temp: 98.8 F (37.1 C)  SpO2: 95%   Wt Readings from Last 3 Encounters:  04/16/22 131 lb 9.8 oz (59.7 kg)  02/10/22 121 lb 7.6 oz (55.1 kg)  01/15/22 127 lb 12.8 oz (58 kg)   Physical Exam Vitals reviewed.  Constitutional:      Appearance: Normal appearance.  Cardiovascular:  Rate and Rhythm: Normal rate and regular rhythm.     Pulses: Normal pulses.     Heart sounds: Normal heart sounds.  Pulmonary:     Effort: Pulmonary effort is normal.     Breath sounds: Normal breath sounds.  Neurological:     General: No focal deficit present.     Mental Status: She is alert and oriented to person, place, and time.  Psychiatric:        Mood and Affect: Mood normal.        Behavior: Behavior normal.     Breast Exam Chaperone: Thana Ates     LABORATORY DATA:  I have reviewed the data as listed    Latest Ref Rng & Units 02/11/2022    5:05 AM 02/10/2022    2:33 PM 08/28/2021    4:36 AM  CMP  Glucose 70 - 99  mg/dL 80  107  81   BUN 8 - 23 mg/dL '23  31  7   ' Creatinine 0.44 - 1.00 mg/dL 0.72  1.03  0.58   Sodium 135 - 145 mmol/L 135  137  125   Potassium 3.5 - 5.1 mmol/L 4.0  4.4  4.4   Chloride 98 - 111 mmol/L 102  103  94   CO2 22 - 32 mmol/L '25  27  26   ' Calcium 8.9 - 10.3 mg/dL 9.2  9.6  8.2   Total Protein 6.5 - 8.1 g/dL 9.1  10.9    Total Bilirubin 0.3 - 1.2 mg/dL 0.8  0.7    Alkaline Phos 38 - 126 U/L 36  42    AST 15 - 41 U/L 27  31    ALT 0 - 44 U/L 12  15     No results found for: "WUJ811" Lab Results  Component Value Date   WBC 3.4 (L) 04/09/2022   HGB 10.0 (L) 04/09/2022   HCT 35.4 (L) 04/09/2022   MCV 100.3 (H) 04/09/2022   PLT 91 (L) 04/09/2022   NEUTROABS 0.3 (LL) 04/09/2022    ASSESSMENT:  1.    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 Nevada Crane for pancytopenia. Subsequently her white count has recovered along with recovery of the hemoglobin. -Bone marrow biopsy on 11/06/2020 shows hypercellular marrow with trilineage dyspoiesis and polytypic plasmacytosis.  Primary differential includes CMML and other MDS.  Definitive blast population is not identified morphologically or by flow.  There is a increased immature mononuclear cells favored to be monocytic in origin.  Megakaryocytes are frequently hypolobated and hyperchromatic without clustering.  Atypical appearing erythroid precursors.  Increased plasma cells 5 to 9% which are polytypic by kappa and lambda staining. - Chromosome analysis shows 46,X, idiocentric X chromosome[9]/46, XX[11]. -MDS FISH panel was negative. -Revised IPSS score of 3.  In the absence of treatment, median survival of 5.3 years and 25% AML progression is 10.8 years. -SPEP, methylmalonic acid, ferritin levels were normal. - Serum copper level is 87. - NGS (11/19/2020): SRSF2, TET2   2.  Stage I left breast cancer: -Diagnosed in Sep 1999, treated with lumpectomy and XRT. ER/PR was positive and received tamoxifen for 5  years. -Mammogram on 2019-10-30 was BI-RADS Category 2. Physical exam did not reveal any palpable masses today.   3.  Stage I adenocarcinoma of the left upper lobe of the lung: -Left upper lobectomy on 2005-06-01. CT chest on 2018-06-09 was stable. - CT CAP on 10/24/2020 shows stable postsurgical changes from the partial left upper lobe  resection.  No other signs of malignancy.   PLAN:  1.   Low risk MDS/CMML: - She was started back on Aranesp 200 mcg every 4 weeks on 01/15/2022. - She did not require any Aranesp in April.  She received it on 03/12/2022 and 04/09/2022. - Denies any bleeding issues.  Overall she is feeling well and functioning well. - I would continue Aranesp every 4 weeks at this time.  I have reviewed her ferritin which was 272 and percent saturation 14 which was stable.  No indication for parenteral iron therapy. - RTC 4 months with repeat ferritin, iron panel and CBC.   2.  Peripheral neuropathy: - Continue gabapentin.   3.  Hyponatremia: - Labs on 04/02/2022 from Dr. Juel Burrow office showed sodium improved to 138.  Breast Cancer therapy associated bone loss: I have recommended calcium, Vitamin D and weight bearing exercises.  Orders placed this encounter:  No orders of the defined types were placed in this encounter.   The patient has a good understanding of the overall plan. She agrees with it. She will call with any problems that may develop before the next visit here.  Derek Jack, MD [facility] [phone]   I, Thana Ates, am acting as a scribe for Dr. Derek Jack.  I, Derek Jack MD, have reviewed the above documentation for accuracy and completeness, and I agree with the above.

## 2022-04-30 ENCOUNTER — Encounter (HOSPITAL_COMMUNITY): Payer: Self-pay | Admitting: Hematology and Oncology

## 2022-05-07 ENCOUNTER — Inpatient Hospital Stay (HOSPITAL_COMMUNITY): Payer: Medicare Other | Attending: Hematology

## 2022-05-07 ENCOUNTER — Inpatient Hospital Stay (HOSPITAL_COMMUNITY): Payer: Medicare Other

## 2022-05-07 VITALS — BP 148/72 | HR 73 | Temp 98.4°F | Resp 18

## 2022-05-07 DIAGNOSIS — E871 Hypo-osmolality and hyponatremia: Secondary | ICD-10-CM | POA: Diagnosis not present

## 2022-05-07 DIAGNOSIS — D539 Nutritional anemia, unspecified: Secondary | ICD-10-CM

## 2022-05-07 DIAGNOSIS — D469 Myelodysplastic syndrome, unspecified: Secondary | ICD-10-CM

## 2022-05-07 DIAGNOSIS — Z79899 Other long term (current) drug therapy: Secondary | ICD-10-CM | POA: Diagnosis not present

## 2022-05-07 DIAGNOSIS — M2550 Pain in unspecified joint: Secondary | ICD-10-CM | POA: Insufficient documentation

## 2022-05-07 DIAGNOSIS — D4620 Refractory anemia with excess of blasts, unspecified: Secondary | ICD-10-CM | POA: Diagnosis not present

## 2022-05-07 DIAGNOSIS — G629 Polyneuropathy, unspecified: Secondary | ICD-10-CM | POA: Diagnosis not present

## 2022-05-07 DIAGNOSIS — Z85118 Personal history of other malignant neoplasm of bronchus and lung: Secondary | ICD-10-CM | POA: Diagnosis not present

## 2022-05-07 DIAGNOSIS — Z853 Personal history of malignant neoplasm of breast: Secondary | ICD-10-CM | POA: Diagnosis not present

## 2022-05-07 DIAGNOSIS — R197 Diarrhea, unspecified: Secondary | ICD-10-CM | POA: Diagnosis not present

## 2022-05-07 LAB — SAMPLE TO BLOOD BANK

## 2022-05-07 LAB — CBC WITH DIFFERENTIAL/PLATELET
Abs Immature Granulocytes: 0.01 10*3/uL (ref 0.00–0.07)
Basophils Absolute: 0 10*3/uL (ref 0.0–0.1)
Basophils Relative: 0 %
Eosinophils Absolute: 0 10*3/uL (ref 0.0–0.5)
Eosinophils Relative: 0 %
HCT: 35.3 % — ABNORMAL LOW (ref 36.0–46.0)
Hemoglobin: 10 g/dL — ABNORMAL LOW (ref 12.0–15.0)
Immature Granulocytes: 0 %
Lymphocytes Relative: 50 %
Lymphs Abs: 1.5 10*3/uL (ref 0.7–4.0)
MCH: 28.3 pg (ref 26.0–34.0)
MCHC: 28.3 g/dL — ABNORMAL LOW (ref 30.0–36.0)
MCV: 100 fL (ref 80.0–100.0)
Monocytes Absolute: 1.4 10*3/uL — ABNORMAL HIGH (ref 0.1–1.0)
Monocytes Relative: 45 %
Neutro Abs: 0.2 10*3/uL — CL (ref 1.7–7.7)
Neutrophils Relative %: 5 %
Platelets: 83 10*3/uL — ABNORMAL LOW (ref 150–400)
RBC: 3.53 MIL/uL — ABNORMAL LOW (ref 3.87–5.11)
RDW: 18.1 % — ABNORMAL HIGH (ref 11.5–15.5)
WBC Morphology: REACTIVE
WBC: 3.4 10*3/uL — ABNORMAL LOW (ref 4.0–10.5)
nRBC: 1.5 % — ABNORMAL HIGH (ref 0.0–0.2)

## 2022-05-07 MED ORDER — SODIUM CHLORIDE 1 G PO TABS: 1.0000 g | ORAL_TABLET | Freq: Every day | ORAL | 1 refills | Status: DC

## 2022-05-07 MED ORDER — DARBEPOETIN ALFA 200 MCG/0.4ML IJ SOSY
200.0000 ug | PREFILLED_SYRINGE | Freq: Once | INTRAMUSCULAR | Status: AC
  Administered 2022-05-07: 200 ug via SUBCUTANEOUS
  Filled 2022-05-07: qty 0.4

## 2022-05-07 NOTE — Patient Instructions (Signed)
[facility]  Discharge Instructions: Thank you for choosing [facility] to provide your oncology and hematology care.  If you have a lab appointment with the [facility], please come in thru the Main Entrance and check in at the main information desk.  Wear comfortable clothing and clothing appropriate for easy access to any Portacath or PICC line.   We strive to give you quality time with your provider. You may need to reschedule your appointment if you arrive late (15 or more minutes).  Arriving late affects you and other patients whose appointments are after yours.  Also, if you miss three or more appointments without notifying the office, you may be dismissed from the clinic at the provider's discretion.      For prescription refill requests, have your pharmacy contact our office and allow 72 hours for refills to be completed.    Today you received the following chemotherapy and/or immunotherapy agents Aranesp injection.      BELOW ARE SYMPTOMS THAT SHOULD BE REPORTED IMMEDIATELY: *FEVER GREATER THAN 100.4 F (38 C) OR HIGHER *CHILLS OR SWEATING *NAUSEA AND VOMITING THAT IS NOT CONTROLLED WITH YOUR NAUSEA MEDICATION *UNUSUAL SHORTNESS OF BREATH *UNUSUAL BRUISING OR BLEEDING *URINARY PROBLEMS (pain or burning when urinating, or frequent urination) *BOWEL PROBLEMS (unusual diarrhea, constipation, pain near the anus) TENDERNESS IN MOUTH AND THROAT WITH OR WITHOUT PRESENCE OF ULCERS (sore throat, sores in mouth, or a toothache) UNUSUAL RASH, SWELLING OR PAIN  UNUSUAL VAGINAL DISCHARGE OR ITCHING   Items with * indicate a potential emergency and should be followed up as soon as possible or go to the Emergency Department if any problems should occur.  Please show the CHEMOTHERAPY ALERT CARD or IMMUNOTHERAPY ALERT CARD at check-in to the Emergency Department and triage nurse.  Should you have questions after your visit or need to cancel or reschedule your  appointment, please contact [facility] [phone]  and follow the prompts.  Office hours are 8:00 a.m. to 4:30 p.m. Monday - Friday. Please note that voicemails left after 4:00 p.m. may not be returned until the following business day.  We are closed weekends and major holidays. You have access to a nurse at all times for urgent questions. Please call the main number to the clinic [phone] and follow the prompts.  For any non-urgent questions, you may also contact your provider using MyChart. We now offer e-Visits for anyone 35 and older to request care online for non-urgent symptoms. For details visit [email or web address].   Also download the MyChart app! Go to the app store, search "MyChart", open the app, select [facility], and log in with your MyChart username and password.  Masks are optional in the cancer centers. If you would like for your care team to wear a mask while they are taking care of you, please let them know. For doctor visits, patients may have with them one support person who is at least 86 years old. At this time, visitors are not allowed in the infusion area.

## 2022-05-07 NOTE — Progress Notes (Signed)
CRITICAL VALUE ALERT Critical value received:  ANC 0.2 Date of notification:  05-07-2022 Time of notification: 15:32 pm  Critical value read back:  Yes.   Nurse who received alert:  B. Cheynne Virden RN MD notified time and response:  Katragadda. Patient here for Aransep injection.

## 2022-05-07 NOTE — Progress Notes (Addendum)
Lisa [name] presents today for injection per the provider's orders. Aransep administration without incident; injection site WNL; see [name] for injection details.  Patient tolerated procedure well and without incident.  No questions or complaints noted at this time. Pt's hemoglobin noted to be 10 today.  Discharged from clinic via wheelchair in stable condition. Alert and oriented x 3. F/U with [facility] as scheduled.

## 2022-06-03 ENCOUNTER — Ambulatory Visit (INDEPENDENT_AMBULATORY_CARE_PROVIDER_SITE_OTHER): Payer: Medicare Other | Admitting: Student

## 2022-06-03 ENCOUNTER — Encounter: Payer: Self-pay | Admitting: Student

## 2022-06-03 VITALS — BP 116/60 | HR 98 | Ht 64.0 in | Wt 136.6 lb

## 2022-06-03 DIAGNOSIS — R600 Localized edema: Secondary | ICD-10-CM

## 2022-06-03 DIAGNOSIS — Z8679 Personal history of other diseases of the circulatory system: Secondary | ICD-10-CM | POA: Diagnosis not present

## 2022-06-03 DIAGNOSIS — I350 Nonrheumatic aortic (valve) stenosis: Secondary | ICD-10-CM

## 2022-06-03 DIAGNOSIS — D469 Myelodysplastic syndrome, unspecified: Secondary | ICD-10-CM

## 2022-06-03 MED ORDER — METOPROLOL SUCCINATE ER 25 MG PO TB24: 25.0000 mg | ORAL_TABLET | Freq: Every day | ORAL | 2 refills | Status: DC

## 2022-06-03 NOTE — Progress Notes (Signed)
Cardiology Office Note    Date:  06/03/2022   ID:  Lisa [name], DOB 04/29/1934, MRN [id number]  PCP:  Celene Squibb, MD  Cardiologist: Evaluated by Dr. Margaretann Loveless during admission but lives in [facility] and prefers to follow-up in [place], [place]  Chief Complaint  Patient presents with   Follow-up    Overdue 6 month visit    History of Present Illness:    Lisa [name] is a 88 y.o. female with past medical history of paroxysmal atrial fibrillation (diagnosed in 10/2020), HTN, COPD, hypothyroidism, gout, breast cancer (s/p lumpectomy and radiation), history of lung cancer (s/p LUL lobectomy) and CMML/MDS who presents to the office today for overdue 53-month follow-up.  She was last examined by myself in 05/2021 and had previously been evaluated by the Cardiology service for atrial fibrillation with RVR during a prior admission for weakness and AMS. At the time of follow-up, she reported overall doing well and denied any recent chest pain or palpitations. She was continued on Toprol-XL 50 mg daily and had not been on anticoagulation given her anemia and thrombocytopenia. She did have lower extremity edema on examination and was taking Lasix 10 mg daily, therefore this was titrated to 20 mg daily with close follow-up labs recommended.  By review of notes, she was admitted to [facility] in 01/2022 for a TIA and given her MDS, Neurology recommended only using ASA 81 mg daily.  In talking with the patient and her daughter today, she reports overall doing well since her last office visit. Her activity is limited but she denies any recent chest pain or palpitations. No recent orthopnea, PND or pitting edema. Lasix was previously discontinued given no issues with edema. She does use 2 L nasal cannula at night but does not need this during the day. They do check her vitals on occasion at home and say these have been well controlled. She continues to have anemia and thrombocytopenia which is  followed by Hematology but denies any evidence of active bleeding.  Past Medical History:  Diagnosis Date   Adenocarcinoma of left lung ([place]) 2006   Arthritis    Asthma    Cancer of breast, female ([place])    Cancer of lung ([place])    Hypertension    Hypothyroidism    Invasive ductal carcinoma of left breast ([place]) 1999   Neutropenia ([place]) 06/09/2016   Personal history of radiation therapy     Past Surgical History:  Procedure Laterality Date   BIOPSY  10/23/2020   Procedure: BIOPSY;  Surgeon: Yetta Flock, MD;  Location: [facility] ENDOSCOPY;  Service: Gastroenterology;;   BREAST BIOPSY     left axillary node dissection   BREAST LUMPECTOMY Left    COLONOSCOPY  03/08/2003   GHW:EXHBZJIRCV rectal polyps destroyed with the tip of the snare/Polyps at hepatic flexure, splenic flexure at 72 cm/Left-sided diverticula: unable to retrieve path   COLONOSCOPY  09/12/2008   ELF:YBOFBP rectum and distal sigmoid diminutive polyps/scattered left sided diverticulum. hyperplastic   COLONOSCOPY N/A 03/06/2013   ZWC:HENIDPO polyp-removed as described above; colonic diverticulosis. hyperplastic polyps. next TCS 03/2018   ESOPHAGOGASTRODUODENOSCOPY (EGD) WITH PROPOFOL N/A 10/23/2020   Procedure: ESOPHAGOGASTRODUODENOSCOPY (EGD) WITH PROPOFOL;  Surgeon: Yetta Flock, MD;  Location: [facility];  Service: Gastroenterology;  Laterality: N/A;   FOOT SURGERY     LUNG REMOVAL, PARTIAL     upper lobe    Current Medications: Outpatient Medications Prior to Visit  Medication Sig Dispense Refill   acetaminophen (TYLENOL)  325 MG tablet Take 2 tablets (650 mg total) by mouth every 6 (six) hours.     aspirin EC 81 MG EC tablet Take 1 tablet (81 mg total) by mouth daily. Swallow whole. 30 tablet 11   gabapentin (NEURONTIN) 100 MG capsule Take 1 capsule (100 mg total) by mouth every 12 (twelve) hours as needed (neuropathy). 60 capsule 1   levothyroxine (SYNTHROID) 100 MCG tablet Take 1 tablet (100 mcg total) by  mouth daily before breakfast. 30 tablet 1   magnesium oxide (MAG-OX) 400 MG tablet Take 400 mg by mouth 2 (two) times daily.     mirtazapine (REMERON) 7.5 MG tablet Take 1 tablet (7.5 mg total) by mouth at bedtime. 30 tablet 2   Multiple Vitamin (MULTI-VITAMIN) tablet Take 1 tablet by mouth daily.     OXYGEN Inhale 2 L into the lungs at bedtime.     senna-docusate (SENOKOT-S) 8.6-50 MG tablet Take 1 tablet by mouth at bedtime.     sodium chloride 1 g tablet Take 1 tablet (1 g total) by mouth daily. 30 tablet 1   umeclidinium bromide (INCRUSE ELLIPTA) 62.5 MCG/INH AEPB Inhale into the lungs daily.     Vitamin D, Ergocalciferol, (DRISDOL) 1.25 MG (50000 UNIT) CAPS capsule Take 50,000 Units by mouth once a week. (Patient not taking: Reported on 06/03/2022)     No facility-administered medications prior to visit.     Allergies:   Meloxicam and Micardis hct [telmisartan-hctz]   Social History   Socioeconomic History   Marital status: Widowed    Spouse name: Not on file   Number of children: Not on file   Years of education: Not on file   Highest education level: Not on file  Occupational History   Not on file  Tobacco Use   Smoking status: Former    Packs/day: 0.75    Years: 35.00    Total pack years: 26.25    Types: Cigarettes    Quit date: 41    Years since quitting: 30.6   Smokeless tobacco: Never   Tobacco comments:    smoke-free X 30 yeras  Vaping Use   Vaping Use: Never used  Substance and Sexual Activity   Alcohol use: No   Drug use: No   Sexual activity: Not Currently  Other Topics Concern   Not on file  Social History Narrative   Not on file   Social Determinants of Health   Financial Resource Strain: Not on file  Food Insecurity: Not on file  Transportation Needs: No Transportation Needs (12/12/2020)   PRAPARE - Hydrologist (Medical): No    Lack of Transportation (Non-Medical): No  Physical Activity: Inactive (12/12/2020)    Exercise Vital Sign    Days of Exercise per Week: 0 days    Minutes of Exercise per Session: 0 min  Stress: Not on file  Social Connections: Not on file     Family History:  The patient's family history includes Cancer in her mother and sister.   Review of Systems:    Please see the history of present illness.     All other systems reviewed and are otherwise negative except as noted above.   Physical Exam:    VS:  BP 116/60   Pulse 98   Ht 5\' 4"  (1.626 m)   Wt 136 lb 9.6 oz (62 kg)   SpO2 94%   BMI 23.45 kg/m    General: Pleasant elderly female appearing in no  acute distress. Head: Normocephalic, atraumatic. Neck: No carotid bruits. JVD not elevated.  Lungs: Respirations regular and unlabored, without wheezes or rales.  Heart: Regular rate and rhythm. No S3 or S4.  2/6 SEM along RUSB.  Abdomen: Appears non-distended. No obvious abdominal masses. Msk:  Strength and tone appear normal for age. No obvious joint deformities or effusions. Extremities: No clubbing or cyanosis. No pitting edema.  Distal pedal pulses are 2+ bilaterally. Neuro: Alert and oriented X 3. Moves all extremities spontaneously. No focal deficits noted. Psych:  Responds to questions appropriately with a normal affect. Skin: No rashes or lesions noted  Wt Readings from Last 3 Encounters:  06/03/22 136 lb 9.6 oz (62 kg)  04/16/22 131 lb 9.8 oz (59.7 kg)  02/10/22 121 lb 7.6 oz (55.1 kg)     Studies/Labs Reviewed:   EKG:  EKG is not ordered today.   Recent Labs: 08/28/2021: Magnesium 1.9 02/10/2022: TSH 1.990 02/11/2022: ALT 12; BUN 23; Creatinine, Ser 0.72; Potassium 4.0; Sodium 135 05/07/2022: Hemoglobin 10.0; Platelets 83   Lipid Panel    Component Value Date/Time   CHOL 106 02/11/2022 0505   TRIG 49 02/11/2022 0505   HDL 31 (L) 02/11/2022 0505   CHOLHDL 3.4 02/11/2022 0505   VLDL 10 02/11/2022 0505   LDLCALC 65 02/11/2022 0505    Additional studies/ records that were reviewed today  include:   Echocardiogram: 01/2022 IMPRESSIONS     1. Left ventricular ejection fraction, by estimation, is 55 to 60%. The  left ventricle has normal function. The left ventricle has no regional  wall motion abnormalities. There is moderate left ventricular hypertrophy.  Left ventricular diastolic  parameters are consistent with Grade I diastolic dysfunction (impaired  relaxation).   2. Right ventricular systolic function is normal. The right ventricular  size is mildly enlarged. There is moderately elevated pulmonary artery  systolic pressure.   3. Left atrial size was severely dilated.   4. Right atrial size was moderately dilated.   5. A small pericardial effusion is present. The pericardial effusion is  posterior to the left ventricle.   6. The mitral valve is degenerative. Mild mitral valve regurgitation.  Mild mitral stenosis.   7. The tricuspid valve is degenerative.   8. The aortic valve is calcified. Aortic valve regurgitation is not  visualized. Moderate aortic valve stenosis. Mean gradient only 18 mmHG,  DVI 0.33 with reduced excursion on visualization.   9. Pulmonic valve regurgitation is moderate.  10. The inferior vena cava is normal in size with greater than 50%  respiratory variability, suggesting right atrial pressure of 3 mmHg.   Comparison(s): Aortic Valve sclerosis has progressed.   Assessment:    1. History of atrial fibrillation   2. Bilateral lower extremity edema   3. MDS (myelodysplastic syndrome) ([place])   4. Aortic valve stenosis, etiology of cardiac valve disease unspecified      Plan:   In order of problems listed above:  1. Paroxysmal Atrial Fibrillation - She had what was felt to be an isolated episode during admission in 10/2020 as she was being treated for an AKI, hyponatremia and H. pylori at that time. She has been off Toprol-XL for unclear reasons (perhaps due to intermittent hypotension during her prior admission). I did recommend we  restart her Toprol-XL at a lower dose of 25mg  daily. Encouraged them to follow BP at home.  - While her CHA2DS2-VASc Score is at least 6 (HTN, Female, Age (2), TIA (2)), she has not been  on anticoagulation given her anemia and thrombocytopenia in the setting of MDS. Risks and benefits previously reviewed with the patient and her family and she has remained off this. Her platelet count has been variable from 40 - 90 K over the past 4 months.   2. Lower Extremity Edema - This has resolved since her prior visit. She is no longer on diuretic therapy and has been limiting her sodium intake.   3. MDS/Anemia - Followed by Hematology/Oncology. CBC last month showed her Hgb was low at 10.0 and platelets at 83 K.   4. Aortic Stenosis - Recent echo in 01/2022 showed moderate AS. Would anticipate obtaining repeat imaging next year.    Medication Adjustments/Labs and Tests Ordered: Current medicines are reviewed at length with the patient today.  Concerns regarding medicines are outlined above.  Medication changes, Labs and Tests ordered today are listed in the Patient Instructions below. Patient Instructions  Medication Instructions:  Your physician has recommended you make the following change in your medication:  -Restart Toprol XL 25 mg tablets daily   Labwork: None  Testing/Procedures: None  Follow-Up: Follow up with MD or Bernerd Pho, PA-C in 6 months.   Any Other Special Instructions Will Be Listed Below (If Applicable).     If you need a refill on your cardiac medications before your next appointment, please call your pharmacy.    Signed, Erma Heritage, PA-C  06/03/2022 7:09 PM    [facility] [address] [place], [place] [address] Phone: [phone] Fax: [phone]

## 2022-06-03 NOTE — Patient Instructions (Addendum)
Medication Instructions:  Your physician has recommended you make the following change in your medication:  -Restart Toprol XL 25 mg tablets daily   Labwork: None  Testing/Procedures: None  Follow-Up: Follow up with MD or Bernerd Pho, PA-C in 6 months.   Any Other Special Instructions Will Be Listed Below (If Applicable).     If you need a refill on your cardiac medications before your next appointment, please call your pharmacy.

## 2022-06-04 ENCOUNTER — Inpatient Hospital Stay: Payer: Medicare Other

## 2022-06-04 ENCOUNTER — Inpatient Hospital Stay: Payer: Medicare Other | Attending: Hematology

## 2022-06-04 DIAGNOSIS — D46Z Other myelodysplastic syndromes: Secondary | ICD-10-CM | POA: Insufficient documentation

## 2022-06-04 DIAGNOSIS — Z79899 Other long term (current) drug therapy: Secondary | ICD-10-CM | POA: Insufficient documentation

## 2022-06-04 DIAGNOSIS — Z85118 Personal history of other malignant neoplasm of bronchus and lung: Secondary | ICD-10-CM | POA: Insufficient documentation

## 2022-06-04 DIAGNOSIS — D539 Nutritional anemia, unspecified: Secondary | ICD-10-CM | POA: Insufficient documentation

## 2022-06-04 DIAGNOSIS — Z853 Personal history of malignant neoplasm of breast: Secondary | ICD-10-CM | POA: Insufficient documentation

## 2022-06-04 DIAGNOSIS — D469 Myelodysplastic syndrome, unspecified: Secondary | ICD-10-CM | POA: Insufficient documentation

## 2022-06-18 DIAGNOSIS — G629 Polyneuropathy, unspecified: Secondary | ICD-10-CM | POA: Diagnosis not present

## 2022-06-30 ENCOUNTER — Other Ambulatory Visit: Payer: Self-pay

## 2022-06-30 DIAGNOSIS — D469 Myelodysplastic syndrome, unspecified: Secondary | ICD-10-CM

## 2022-07-01 ENCOUNTER — Inpatient Hospital Stay: Payer: Medicare Other

## 2022-07-01 VITALS — BP 104/50 | HR 80 | Temp 98.5°F | Resp 18

## 2022-07-01 DIAGNOSIS — Z79899 Other long term (current) drug therapy: Secondary | ICD-10-CM | POA: Diagnosis not present

## 2022-07-01 DIAGNOSIS — Z853 Personal history of malignant neoplasm of breast: Secondary | ICD-10-CM | POA: Diagnosis not present

## 2022-07-01 DIAGNOSIS — D469 Myelodysplastic syndrome, unspecified: Secondary | ICD-10-CM

## 2022-07-01 DIAGNOSIS — D539 Nutritional anemia, unspecified: Secondary | ICD-10-CM | POA: Diagnosis not present

## 2022-07-01 DIAGNOSIS — D46Z Other myelodysplastic syndromes: Secondary | ICD-10-CM | POA: Diagnosis present

## 2022-07-01 DIAGNOSIS — Z85118 Personal history of other malignant neoplasm of bronchus and lung: Secondary | ICD-10-CM | POA: Diagnosis not present

## 2022-07-01 LAB — CBC WITH DIFFERENTIAL/PLATELET
Abs Immature Granulocytes: 0.03 10*3/uL (ref 0.00–0.07)
Basophils Absolute: 0 10*3/uL (ref 0.0–0.1)
Basophils Relative: 0 %
Eosinophils Absolute: 0 10*3/uL (ref 0.0–0.5)
Eosinophils Relative: 0 %
HCT: 34.6 % — ABNORMAL LOW (ref 36.0–46.0)
Hemoglobin: 9.8 g/dL — ABNORMAL LOW (ref 12.0–15.0)
Immature Granulocytes: 1 %
Lymphocytes Relative: 27 %
Lymphs Abs: 1.6 10*3/uL (ref 0.7–4.0)
MCH: 28.7 pg (ref 26.0–34.0)
MCHC: 28.3 g/dL — ABNORMAL LOW (ref 30.0–36.0)
MCV: 101.5 fL — ABNORMAL HIGH (ref 80.0–100.0)
Monocytes Absolute: 3.9 10*3/uL — ABNORMAL HIGH (ref 0.1–1.0)
Monocytes Relative: 68 %
Neutro Abs: 0.2 10*3/uL — CL (ref 1.7–7.7)
Neutrophils Relative %: 4 %
Platelets: 103 10*3/uL — ABNORMAL LOW (ref 150–400)
RBC: 3.41 MIL/uL — ABNORMAL LOW (ref 3.87–5.11)
RDW: 19.8 % — ABNORMAL HIGH (ref 11.5–15.5)
WBC: 5.7 10*3/uL (ref 4.0–10.5)
nRBC: 4 % — ABNORMAL HIGH (ref 0.0–0.2)

## 2022-07-01 LAB — SAMPLE TO BLOOD BANK

## 2022-07-01 MED ORDER — DARBEPOETIN ALFA 200 MCG/0.4ML IJ SOSY
200.0000 ug | PREFILLED_SYRINGE | Freq: Once | INTRAMUSCULAR | Status: AC
  Administered 2022-07-01: 200 ug via SUBCUTANEOUS
  Filled 2022-07-01: qty 0.4

## 2022-07-01 NOTE — Patient Instructions (Signed)
[facility]  Discharge Instructions: Thank you for choosing [facility] to provide your oncology and hematology care.  If you have a lab appointment with the [facility], please come in thru the Main Entrance and check in at the main information desk.  Wear comfortable clothing and clothing appropriate for easy access to any Portacath or PICC line.   We strive to give you quality time with your provider. You may need to reschedule your appointment if you arrive late (15 or more minutes).  Arriving late affects you and other patients whose appointments are after yours.  Also, if you miss three or more appointments without notifying the office, you may be dismissed from the clinic at the provider's discretion.      For prescription refill requests, have your pharmacy contact our office and allow 72 hours for refills to be completed.    Today you received the following aranesp injection, return as scheduled.   To help prevent nausea and vomiting after your treatment, we encourage you to take your nausea medication as directed.  BELOW ARE SYMPTOMS THAT SHOULD BE REPORTED IMMEDIATELY: *FEVER GREATER THAN 100.4 F (38 C) OR HIGHER *CHILLS OR SWEATING *NAUSEA AND VOMITING THAT IS NOT CONTROLLED WITH YOUR NAUSEA MEDICATION *UNUSUAL SHORTNESS OF BREATH *UNUSUAL BRUISING OR BLEEDING *URINARY PROBLEMS (pain or burning when urinating, or frequent urination) *BOWEL PROBLEMS (unusual diarrhea, constipation, pain near the anus) TENDERNESS IN MOUTH AND THROAT WITH OR WITHOUT PRESENCE OF ULCERS (sore throat, sores in mouth, or a toothache) UNUSUAL RASH, SWELLING OR PAIN  UNUSUAL VAGINAL DISCHARGE OR ITCHING   Items with * indicate a potential emergency and should be followed up as soon as possible or go to the Emergency Department if any problems should occur.  Please show the CHEMOTHERAPY ALERT CARD or IMMUNOTHERAPY ALERT CARD at check-in to the Emergency Department  and triage nurse.  Should you have questions after your visit or need to cancel or reschedule your appointment, please contact [facility] [phone]  and follow the prompts.  Office hours are 8:00 a.m. to 4:30 p.m. Monday - Friday. Please note that voicemails left after 4:00 p.m. may not be returned until the following business day.  We are closed weekends and major holidays. You have access to a nurse at all times for urgent questions. Please call the main number to the clinic [phone] and follow the prompts.  For any non-urgent questions, you may also contact your provider using MyChart. We now offer e-Visits for anyone 64 and older to request care online for non-urgent symptoms. For details visit [email or web address].   Also download the MyChart app! Go to the app store, search "MyChart", open the app, select , and log in with your MyChart username and password.  Masks are optional in the cancer centers. If you would like for your care team to wear a mask while they are taking care of you, please let them know. You may have one support person who is at least 86 years old accompany you for your appointments.

## 2022-07-01 NOTE — Progress Notes (Signed)
Patient presents today for aranesp injection. Hemoglobin reviewed prior to administration. VSS tolerated without incident or complaint. See [name] for details. Patient stable during and after injection. Patient discharged in satisfactory condition with no s/s of distress noted.

## 2022-07-01 NOTE — Progress Notes (Unsigned)
CRITICAL VALUE ALERT Critical value received:  ANC 0.2 Date of notification:  07/01/22 Time of notification: 2956 Critical value read back:  Yes.   Nurse who received alert:  C. Pascual Mantel RN MD notified time and response:  DR. Delton Coombes, [phone]

## 2022-07-02 ENCOUNTER — Inpatient Hospital Stay: Payer: Medicare Other

## 2022-07-29 ENCOUNTER — Other Ambulatory Visit: Payer: Self-pay

## 2022-07-29 DIAGNOSIS — D539 Nutritional anemia, unspecified: Secondary | ICD-10-CM

## 2022-07-29 DIAGNOSIS — D469 Myelodysplastic syndrome, unspecified: Secondary | ICD-10-CM

## 2022-07-30 ENCOUNTER — Inpatient Hospital Stay (HOSPITAL_COMMUNITY): Payer: Medicare Other | Attending: Hematology

## 2022-07-30 ENCOUNTER — Inpatient Hospital Stay: Payer: Medicare Other

## 2022-07-30 VITALS — BP 165/80 | HR 59 | Temp 97.6°F | Resp 18

## 2022-07-30 DIAGNOSIS — E871 Hypo-osmolality and hyponatremia: Secondary | ICD-10-CM | POA: Insufficient documentation

## 2022-07-30 DIAGNOSIS — D469 Myelodysplastic syndrome, unspecified: Secondary | ICD-10-CM

## 2022-07-30 DIAGNOSIS — D4620 Refractory anemia with excess of blasts, unspecified: Secondary | ICD-10-CM | POA: Insufficient documentation

## 2022-07-30 DIAGNOSIS — Z79899 Other long term (current) drug therapy: Secondary | ICD-10-CM | POA: Diagnosis not present

## 2022-07-30 DIAGNOSIS — Z853 Personal history of malignant neoplasm of breast: Secondary | ICD-10-CM | POA: Insufficient documentation

## 2022-07-30 DIAGNOSIS — Z85118 Personal history of other malignant neoplasm of bronchus and lung: Secondary | ICD-10-CM | POA: Diagnosis not present

## 2022-07-30 DIAGNOSIS — D539 Nutritional anemia, unspecified: Secondary | ICD-10-CM

## 2022-07-30 DIAGNOSIS — G629 Polyneuropathy, unspecified: Secondary | ICD-10-CM | POA: Insufficient documentation

## 2022-07-30 LAB — CBC WITH DIFFERENTIAL/PLATELET
Abs Immature Granulocytes: 0 10*3/uL (ref 0.00–0.07)
Basophils Absolute: 0 10*3/uL (ref 0.0–0.1)
Basophils Relative: 0 %
Eosinophils Absolute: 0 10*3/uL (ref 0.0–0.5)
Eosinophils Relative: 0 %
HCT: 35.9 % — ABNORMAL LOW (ref 36.0–46.0)
Hemoglobin: 9.7 g/dL — ABNORMAL LOW (ref 12.0–15.0)
Lymphocytes Relative: 60 %
Lymphs Abs: 1.5 10*3/uL (ref 0.7–4.0)
MCH: 27.2 pg (ref 26.0–34.0)
MCHC: 27 g/dL — ABNORMAL LOW (ref 30.0–36.0)
MCV: 100.6 fL — ABNORMAL HIGH (ref 80.0–100.0)
Monocytes Absolute: 0.8 10*3/uL (ref 0.1–1.0)
Monocytes Relative: 32 %
Neutro Abs: 0.2 10*3/uL — CL (ref 1.7–7.7)
Neutrophils Relative %: 8 %
Platelets: 83 10*3/uL — ABNORMAL LOW (ref 150–400)
RBC: 3.57 MIL/uL — ABNORMAL LOW (ref 3.87–5.11)
RDW: 17.7 % — ABNORMAL HIGH (ref 11.5–15.5)
WBC Morphology: ABNORMAL
WBC: 2.5 10*3/uL — ABNORMAL LOW (ref 4.0–10.5)
nRBC: 2 % — ABNORMAL HIGH (ref 0.0–0.2)

## 2022-07-30 LAB — SAMPLE TO BLOOD BANK

## 2022-07-30 MED ORDER — DARBEPOETIN ALFA 200 MCG/0.4ML IJ SOSY
200.0000 ug | PREFILLED_SYRINGE | Freq: Once | INTRAMUSCULAR | Status: AC
  Administered 2022-07-30: 200 ug via SUBCUTANEOUS
  Filled 2022-07-30: qty 0.4

## 2022-07-30 NOTE — Patient Instructions (Signed)
[facility]  Discharge Instructions: Thank you for choosing [facility] to provide your oncology and hematology care.  If you have a lab appointment with the [facility], please come in thru the Main Entrance and check in at the main information desk.  Wear comfortable clothing and clothing appropriate for easy access to any Portacath or PICC line.   We strive to give you quality time with your provider. You may need to reschedule your appointment if you arrive late (15 or more minutes).  Arriving late affects you and other patients whose appointments are after yours.  Also, if you miss three or more appointments without notifying the office, you may be dismissed from the clinic at the provider's discretion.      For prescription refill requests, have your pharmacy contact our office and allow 72 hours for refills to be completed.    Today you received the following chemotherapy and/or immunotherapy agents Aranesp. Darbepoetin Alfa Injection What is this medication? DARBEPOETIN ALFA (dar be POE e tin AL fa) treats low levels of red blood cells (anemia) caused by kidney disease or chemotherapy. It works by Building control surveyor make more red blood cells, which reduces the need for blood transfusions. This medicine may be used for other purposes; ask your health care provider or pharmacist if you have questions. COMMON BRAND NAME(S): Aranesp What should I tell my care team before I take this medication? They need to know if you have any of these conditions: Blood clots Cancer Heart disease High blood pressure On dialysis Seizures Stroke An unusual or allergic reaction to darbepoetin, latex, other medications, foods, dyes, or preservatives Pregnant or trying to get pregnant Breast-feeding How should I use this medication? This medication is injected into a vein or under the skin. It is usually given by a care team in a hospital or clinic setting. It may  also be given at home. If you get this medication at home, you will be taught how to prepare and give it. Use exactly as directed. Take it as directed on the prescription label at the same time every day. Keep taking it unless your care team tells you to stop. It is important that you put your used needles and syringes in a special sharps container. Do not put them in a trash can. If you do not have a sharps container, call your pharmacist or care team to get one. A special MedGuide will be given to you by the pharmacist with each prescription and refill. Be sure to read this information carefully each time. Talk to your care team about the use of this medication in children. While this medication may be used in children as young as 24 month of age for selected conditions, precautions do apply. Overdosage: If you think you have taken too much of this medicine contact a poison control center or emergency room at once. NOTE: This medicine is only for you. Do not share this medicine with others. What if I miss a dose? If you miss a dose, take it as soon as you can. If it is almost time for your next dose, take only that dose. Do not take double or extra doses. What may interact with this medication? Epoetin alfa Methoxy polyethylene glycol-epoetin beta This list may not describe all possible interactions. Give your health care provider a list of all the medicines, herbs, non-prescription drugs, or dietary supplements you use. Also tell them if you smoke, drink alcohol, or  use illegal drugs. Some items may interact with your medicine. What should I watch for while using this medication? Visit your care team for regular checks on your progress. Check your blood pressure as directed. Know what your blood pressure should be and when to contact your care team. Your condition will be monitored carefully while you are receiving this medication. You may need blood work while taking this medication. What side  effects may I notice from receiving this medication? Side effects that you should report to your care team as soon as possible: Allergic reactions--skin rash, itching, hives, swelling of the face, lips, tongue, or throat Blood clot--pain, swelling, or warmth in the leg, shortness of breath, chest pain Heart attack--pain or tightness in the chest, shoulders, arms, or jaw, nausea, shortness of breath, cold or clammy skin, feeling faint or lightheaded Increase in blood pressure Rash, fever, and swollen lymph nodes Redness, blistering, peeling, or loosening of the skin, including inside the mouth Seizures Stroke--sudden numbness or weakness of the face, arm, or leg, trouble speaking, confusion, trouble walking, loss of balance or coordination, dizziness, severe headache, change in vision Side effects that usually do not require medical attention (report to your care team if they continue or are bothersome): Cough Stomach pain Swelling of the ankles, hands, or feet This list may not describe all possible side effects. Call your doctor for medical advice about side effects. You may report side effects to FDA at 1-800-FDA-1088. Where should I keep my medication? Keep out of the reach of children and pets. Store in a refrigerator. Do not freeze. Do not shake. Protect from light. Keep this medication in the original container until you are ready to take it. See product for storage information. Get rid of any unused medication after the expiration date. To get rid of medications that are no longer needed or have expired: Take the medication to a medication take-back program. Check with your pharmacy or law enforcement to find a location. If you cannot return the medication, ask your pharmacist or care team how to get rid of the medication safely. NOTE: This sheet is a summary. It may not cover all possible information. If you have questions about this medicine, talk to your doctor, pharmacist, or health  care provider.  2023 Elsevier/Gold Standard (2022-01-27 00:00:00)       To help prevent nausea and vomiting after your treatment, we encourage you to take your nausea medication as directed.  BELOW ARE SYMPTOMS THAT SHOULD BE REPORTED IMMEDIATELY: *FEVER GREATER THAN 100.4 F (38 C) OR HIGHER *CHILLS OR SWEATING *NAUSEA AND VOMITING THAT IS NOT CONTROLLED WITH YOUR NAUSEA MEDICATION *UNUSUAL SHORTNESS OF BREATH *UNUSUAL BRUISING OR BLEEDING *URINARY PROBLEMS (pain or burning when urinating, or frequent urination) *BOWEL PROBLEMS (unusual diarrhea, constipation, pain near the anus) TENDERNESS IN MOUTH AND THROAT WITH OR WITHOUT PRESENCE OF ULCERS (sore throat, sores in mouth, or a toothache) UNUSUAL RASH, SWELLING OR PAIN  UNUSUAL VAGINAL DISCHARGE OR ITCHING   Items with * indicate a potential emergency and should be followed up as soon as possible or go to the Emergency Department if any problems should occur.  Please show the CHEMOTHERAPY ALERT CARD or IMMUNOTHERAPY ALERT CARD at check-in to the Emergency Department and triage nurse.  Should you have questions after your visit or need to cancel or reschedule your appointment, please contact [facility] [phone]  and follow the prompts.  Office hours are 8:00 a.m. to 4:30 p.m. Monday - Friday. Please  note that voicemails left after 4:00 p.m. may not be returned until the following business day.  We are closed weekends and major holidays. You have access to a nurse at all times for urgent questions. Please call the main number to the clinic [phone] and follow the prompts.  For any non-urgent questions, you may also contact your provider using MyChart. We now offer e-Visits for anyone 27 and older to request care online for non-urgent symptoms. For details visit [email or web address].   Also download the MyChart app! Go to the app store, search "MyChart", open the app, select [facility], and log in with your  MyChart username and password.  Masks are optional in the cancer centers. If you would like for your care team to wear a mask while they are taking care of you, please let them know. You may have one support person who is at least 86 years old accompany you for your appointments.

## 2022-07-30 NOTE — Progress Notes (Signed)
CRITICAL VALUE ALERT Critical value received:  ANC 0.2.  Date of notification:  07-30-2022 Time of notification: 11:45 am Critical value read back:  Yes.   Nurse who received alert:  C. Page RN MD notified time and response: R. Pennington PA @ 11:50 am. No new orders received.    Lisa [name] presents today for injection per the provider's orders. HGB 9.8 today. Aranesp administration without incident; injection site WNL; see [name] for injection details.  Patient tolerated procedure well and without incident.  No questions or complaints noted at this time. Treatment given today per MD orders.   Discharged from clinic by wheel chair in stable condition. Alert and oriented x 3. F/U with [facility] as scheduled.

## 2022-08-27 ENCOUNTER — Inpatient Hospital Stay: Payer: Medicare Other | Attending: Hematology | Admitting: Hematology

## 2022-08-27 ENCOUNTER — Inpatient Hospital Stay: Payer: Medicare Other

## 2022-08-27 VITALS — BP 192/77 | HR 58 | Temp 97.9°F | Resp 16 | Wt 138.4 lb

## 2022-08-27 DIAGNOSIS — Z79899 Other long term (current) drug therapy: Secondary | ICD-10-CM | POA: Insufficient documentation

## 2022-08-27 DIAGNOSIS — Z888 Allergy status to other drugs, medicaments and biological substances status: Secondary | ICD-10-CM | POA: Insufficient documentation

## 2022-08-27 DIAGNOSIS — Z853 Personal history of malignant neoplasm of breast: Secondary | ICD-10-CM | POA: Insufficient documentation

## 2022-08-27 DIAGNOSIS — M2550 Pain in unspecified joint: Secondary | ICD-10-CM | POA: Insufficient documentation

## 2022-08-27 DIAGNOSIS — D4620 Refractory anemia with excess of blasts, unspecified: Secondary | ICD-10-CM | POA: Insufficient documentation

## 2022-08-27 DIAGNOSIS — R200 Anesthesia of skin: Secondary | ICD-10-CM | POA: Insufficient documentation

## 2022-08-27 DIAGNOSIS — D539 Nutritional anemia, unspecified: Secondary | ICD-10-CM | POA: Diagnosis not present

## 2022-08-27 DIAGNOSIS — R197 Diarrhea, unspecified: Secondary | ICD-10-CM | POA: Insufficient documentation

## 2022-08-27 DIAGNOSIS — D469 Myelodysplastic syndrome, unspecified: Secondary | ICD-10-CM | POA: Insufficient documentation

## 2022-08-27 DIAGNOSIS — Z79890 Hormone replacement therapy (postmenopausal): Secondary | ICD-10-CM | POA: Insufficient documentation

## 2022-08-27 DIAGNOSIS — Z85118 Personal history of other malignant neoplasm of bronchus and lung: Secondary | ICD-10-CM | POA: Insufficient documentation

## 2022-08-27 DIAGNOSIS — E875 Hyperkalemia: Secondary | ICD-10-CM

## 2022-08-27 LAB — COMPREHENSIVE METABOLIC PANEL
ALT: 13 U/L (ref 0–44)
AST: 25 U/L (ref 15–41)
Albumin: 3.4 g/dL — ABNORMAL LOW (ref 3.5–5.0)
Alkaline Phosphatase: 47 U/L (ref 38–126)
Anion gap: 5 (ref 5–15)
BUN: 24 mg/dL — ABNORMAL HIGH (ref 8–23)
CO2: 24 mmol/L (ref 22–32)
Calcium: 9 mg/dL (ref 8.9–10.3)
Chloride: 107 mmol/L (ref 98–111)
Creatinine, Ser: 0.9 mg/dL (ref 0.44–1.00)
GFR, Estimated: >60 mL/min (ref >60)
Glucose, Bld: 88 mg/dL (ref 70–99)
Potassium: 5.6 mmol/L — ABNORMAL HIGH (ref 3.5–5.1)
Sodium: 136 mmol/L (ref 135–145)
Total Bilirubin: 0.5 mg/dL (ref 0.3–1.2)
Total Protein: 9.7 g/dL — ABNORMAL HIGH (ref 6.5–8.1)

## 2022-08-27 LAB — FOLATE: Folate: 31.8 ng/mL (ref >5.9)

## 2022-08-27 LAB — FERRITIN: Ferritin: 307 ng/mL (ref 11–307)

## 2022-08-27 LAB — VITAMIN B12: Vitamin B-12: 789 pg/mL (ref 180–914)

## 2022-08-27 LAB — IRON AND TIBC
Iron: 44 ug/dL (ref 28–170)
Saturation Ratios: 15 % (ref 10.4–31.8)
TIBC: 301 ug/dL (ref 250–450)
UIBC: 257 ug/dL

## 2022-08-27 LAB — SAMPLE TO BLOOD BANK

## 2022-08-27 MED ORDER — DARBEPOETIN ALFA 200 MCG/0.4ML IJ SOSY
200.0000 ug | PREFILLED_SYRINGE | Freq: Once | INTRAMUSCULAR | Status: AC
  Administered 2022-08-27: 200 ug via SUBCUTANEOUS

## 2022-08-27 MED ORDER — SODIUM POLYSTYRENE SULFONATE 15 GM/60ML PO SUSP
30.0000 g | Freq: Once | ORAL | Status: AC
  Administered 2022-08-27: 30 g via ORAL
  Filled 2022-08-27: qty 120

## 2022-08-27 NOTE — Patient Instructions (Addendum)
[place] at [facility] Discharge Instructions   You were seen and examined today by Dr. Delton Coombes.  He reviewed the results of your lab work are normal/stable, with exception of your potassium. It is high. We will give you medication to take home to help get this lowered. It is called Kayexalate. It is a liquid that you drink that causes diarrhea to help lower the potassium.  You will receive an Aranesp injection today. Your hemoglobin was 10.0 today.   Return as scheduled.    Thank you for choosing [place] at [facility] to provide your oncology and hematology care.  To afford each patient quality time with our provider, please arrive at least 15 minutes before your scheduled appointment time.   If you have a lab appointment with the [facility] please come in thru the Main Entrance and check in at the main information desk.  You need to re-schedule your appointment should you arrive 10 or more minutes late.  We strive to give you quality time with our providers, and arriving late affects you and other patients whose appointments are after yours.  Also, if you no show three or more times for appointments you may be dismissed from the clinic at the providers discretion.     Again, thank you for choosing [facility].  Our hope is that these requests will decrease the amount of time that you wait before being seen by our physicians.       _____________________________________________________________  Should you have questions after your visit to [facility], please contact our office at [phone] and follow the prompts.  Our office hours are 8:00 a.m. and 4:30 p.m. Monday - Friday.  Please note that voicemails left after 4:00 p.m. may not be returned until the following business day.  We are closed weekends and major holidays.  You do have access to a nurse 24-7, just call the main number to the clinic  [phone] and do not press any options, hold on the line and a nurse will answer the phone.    For prescription refill requests, have your pharmacy contact our office and allow 72 hours.    Due to Covid, you will need to wear a mask upon entering the hospital. If you do not have a mask, a mask will be given to you at the Main Entrance upon arrival. For doctor visits, patients may have 1 support person age 60 or older with them. For treatment visits, patients can not have anyone with them due to social distancing guidelines and our immunocompromised population.

## 2022-08-27 NOTE — Progress Notes (Signed)
[facility] [address], [place] [address]   Patient Care Team: Celene Squibb, MD as PCP - General (Internal Medicine) [name], MD as Consulting Physician (Gastroenterology)  SUMMARY OF ONCOLOGIC HISTORY: Oncology History Overview Note  Called Dr Juel Burrow office again now, office closed. Pt will see Dr Delton Coombes and I will ask him if he can talk to Dr Nevada Crane after his visit.  Thanks,   Adenocarcinoma of left lung ([place])  06/23/2016 Imaging   CT chest- Stable exam.  No new or progressive findings. Previously described tiny perifissural nodules at the base of the right major fissure are stable and likely represent subpleural lymph nodes.     CHIEF COMPLIANT: Follow-up for MDS and left breast cancer   INTERVAL HISTORY: [name] is a 88 y.o. female here for follow-up of MDS and the left breast cancer.  She is tolerating the Aranesp injections very well.  Denies any bleeding per rectum or melena.  She has arthralgias in the legs which is stable.  Denies any recent infections or hospitalizations.  REVIEW OF SYSTEMS:   Review of Systems  Constitutional:  Negative for appetite change and fatigue.  Gastrointestinal:  Positive for diarrhea.  Musculoskeletal:  Positive for arthralgias.  Neurological:  Positive for numbness (Tingling in the feet and hands).  All other systems reviewed and are negative.   I have reviewed the past medical history, past surgical history, social history and family history with the patient and they are unchanged from previous note.   ALLERGIES:   is allergic to meloxicam and micardis hct [telmisartan-hctz].   MEDICATIONS:  Current Outpatient Medications  Medication Sig Dispense Refill   acetaminophen (TYLENOL) 325 MG tablet Take 2 tablets (650 mg total) by mouth every 6 (six) hours.     albuterol (VENTOLIN HFA) 108 (90 Base) MCG/ACT inhaler Inhale into the lungs.     aspirin EC 81 MG EC tablet Take 1 tablet (81 mg  total) by mouth daily. Swallow whole. 30 tablet 11   gabapentin (NEURONTIN) 100 MG capsule Take 1 capsule (100 mg total) by mouth every 12 (twelve) hours as needed (neuropathy). 60 capsule 1   levothyroxine (SYNTHROID) 100 MCG tablet Take 1 tablet (100 mcg total) by mouth daily before breakfast. 30 tablet 1   magnesium oxide (MAG-OX) 400 MG tablet Take 400 mg by mouth 2 (two) times daily.     metoprolol succinate (TOPROL XL) 25 MG 24 hr tablet Take 1 tablet (25 mg total) by mouth daily. 90 tablet 2   mirtazapine (REMERON) 7.5 MG tablet Take 1 tablet (7.5 mg total) by mouth at bedtime. 30 tablet 2   Multiple Vitamin (MULTI-VITAMIN) tablet Take 1 tablet by mouth daily.     OXYGEN Inhale 2 L into the lungs at bedtime.     senna-docusate (SENOKOT-S) 8.6-50 MG tablet Take 1 tablet by mouth at bedtime.     sodium chloride 1 g tablet Take 1 tablet (1 g total) by mouth daily. 30 tablet 1   umeclidinium bromide (INCRUSE ELLIPTA) 62.5 MCG/INH AEPB Inhale into the lungs daily.     Vitamin D, Ergocalciferol, (DRISDOL) 1.25 MG (50000 UNIT) CAPS capsule Take 50,000 Units by mouth once a week.     No current facility-administered medications for this visit.     PHYSICAL EXAMINATION: Performance status (ECOG): 3 - Symptomatic, >50% confined to bed  Vitals:   08/27/22 1137  BP: (!) 192/77  Pulse: (!) 58  Resp: 16  Temp: 97.9 F (36.6 C)  SpO2: 95%   Wt Readings from Last 3 Encounters:  08/27/22 138 lb 7.2 oz (62.8 kg)  06/03/22 136 lb 9.6 oz (62 kg)  04/16/22 131 lb 9.8 oz (59.7 kg)   Physical Exam Vitals reviewed.  Constitutional:      Appearance: Normal appearance.  Cardiovascular:     Rate and Rhythm: Normal rate and regular rhythm.     Pulses: Normal pulses.     Heart sounds: Normal heart sounds.  Pulmonary:     Effort: Pulmonary effort is normal.     Breath sounds: Normal breath sounds.  Neurological:     General: No focal deficit present.     Mental Status: She is alert and  oriented to person, place, and time.  Psychiatric:        Mood and Affect: Mood normal.        Behavior: Behavior normal.     Breast Exam Chaperone: Thana Ates     LABORATORY DATA:  I have reviewed the data as listed    Latest Ref Rng & Units 08/27/2022   10:29 AM 02/11/2022    5:05 AM 02/10/2022    2:33 PM  CMP  Glucose 70 - 99 mg/dL 88  80  107   BUN 8 - 23 mg/dL _0 Creatinine 0.44 - 1.00 mg/dL 0.90  0.72  1.03   Sodium 135 - 145 mmol/L 136  135  137   Potassium 3.5 - 5.1 mmol/L 5.6  4.0  4.4   Chloride 98 - 111 mmol/L 107  102  103   CO2 22 - 32 mmol/L _1 Calcium 8.9 - 10.3 mg/dL 9.0  9.2  9.6   Total Protein 6.5 - 8.1 g/dL 9.7  9.1  10.9   Total Bilirubin 0.3 - 1.2 mg/dL 0.5  0.8  0.7   Alkaline Phos 38 - 126 U/L 47  36  42   AST 15 - 41 U/L _2 ALT 0 - 44 U/L _3 No results found for: "HLK562" Lab Results  Component Value Date   WBC 2.6 (L) 08/27/2022   HGB 10.0 (L) 08/27/2022   HCT 35.9 (L) 08/27/2022   MCV 101.1 (H) 08/27/2022   PLT 53 (L) 08/27/2022   NEUTROABS 0.1 (LL) 08/27/2022    ASSESSMENT:  1.    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 Nevada Crane for pancytopenia. Subsequently her white count has recovered along with recovery of the hemoglobin. -Bone marrow biopsy on 11/06/2020 shows hypercellular marrow with trilineage dyspoiesis and polytypic plasmacytosis.  Primary differential includes CMML and other MDS.  Definitive blast population is not identified morphologically or by flow.  There is a increased immature mononuclear cells favored to be monocytic in origin.  Megakaryocytes are frequently hypolobated and hyperchromatic without clustering.  Atypical appearing erythroid precursors.  Increased plasma cells 5 to 9% which are polytypic by kappa and lambda staining. - Chromosome analysis shows 46,X, idiocentric X chromosome[9]/46, XX[11]. -MDS FISH panel was negative. -Revised IPSS score of 3.  In  the absence of treatment, median survival of 5.3 years and 25% AML progression is 10.8 years. -SPEP, methylmalonic acid, ferritin levels were normal. - Serum copper level is 87. - NGS (11/19/2020): SRSF2, TET2   2.  Stage I left breast cancer: -Diagnosed in Sep 1999, treated with lumpectomy and XRT.  ER/PR was positive and received tamoxifen for 5 years. -Mammogram on 2019-10-30 was BI-RADS Category 2. Physical exam did not reveal any palpable masses today.   3.  Stage I adenocarcinoma of the left upper lobe of the lung: -Left upper lobectomy on 2005-06-01. CT chest on 2018-06-09 was stable. - CT CAP on 10/24/2020 shows stable postsurgical changes from the partial left upper lobe resection.  No other signs of malignancy.   PLAN:  1.   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 - Reviewed labs today which showed hemoglobin 10.0.  Leukopenia and thrombocytopenia stable. - Ferritin is 307 and percent saturation 15.  W80 and folic acid normal. - LFTs are normal.  Total protein is elevated at 9.7 which we will monitor. - She will proceed with Aranesp today and every 4 weeks.  RTC 4 months with labs and treatment.      Breast Cancer therapy associated bone loss: I have recommended calcium, Vitamin D and weight bearing exercises.  Orders placed this encounter:  No orders of the defined types were placed in this encounter.   The patient has a good understanding of the overall plan. She agrees with it. She will call with any problems that may develop before the next visit here.  Derek Jack, MD [facility] [phone]

## 2022-08-27 NOTE — Progress Notes (Signed)
CRITICAL VALUE STICKER  CRITICAL VALUE: Abs Neutrophils 0.2  RECEIVER (on-site recipient of call):  Elmore Guise, RN  DATE & TIME NOTIFIED: 1135 AM 08/27/22  MD NOTIFIED: Dr. Delton Coombes  TIME OF NOTIFICATION: 11:35 AM  RESPONSE:  Will address at visit this morning

## 2022-08-27 NOTE — Progress Notes (Signed)
Lisa [name] presents today for injection per the provider's orders.  Aranesp administration without incident; injection site WNL; see [name] for injection details.  Patient tolerated procedure well and without incident.  Discharged from clinic by wheel chair and accompanied by care giver in stable condition. Alert and oriented x 3. F/U with [facility] as scheduled.

## 2022-08-27 NOTE — Patient Instructions (Signed)
[facility]  Discharge Instructions: Thank you for choosing [facility] to provide your oncology and hematology care.  If you have a lab appointment with the [facility], please come in thru the Main Entrance and check in at the main information desk.  Wear comfortable clothing and clothing appropriate for easy access to any Portacath or PICC line.   We strive to give you quality time with your provider. You may need to reschedule your appointment if you arrive late (15 or more minutes).  Arriving late affects you and other patients whose appointments are after yours.  Also, if you miss three or more appointments without notifying the office, you may be dismissed from the clinic at the provider's discretion.      For prescription refill requests, have your pharmacy contact our office and allow 72 hours for refills to be completed.    Today you received the following chemotherapy and/or immunotherapy agents Aranesp.       To help prevent nausea and vomiting after your treatment, we encourage you to take your nausea medication as directed.  BELOW ARE SYMPTOMS THAT SHOULD BE REPORTED IMMEDIATELY: *FEVER GREATER THAN 100.4 F (38 C) OR HIGHER *CHILLS OR SWEATING *NAUSEA AND VOMITING THAT IS NOT CONTROLLED WITH YOUR NAUSEA MEDICATION *UNUSUAL SHORTNESS OF BREATH *UNUSUAL BRUISING OR BLEEDING *URINARY PROBLEMS (pain or burning when urinating, or frequent urination) *BOWEL PROBLEMS (unusual diarrhea, constipation, pain near the anus) TENDERNESS IN MOUTH AND THROAT WITH OR WITHOUT PRESENCE OF ULCERS (sore throat, sores in mouth, or a toothache) UNUSUAL RASH, SWELLING OR PAIN  UNUSUAL VAGINAL DISCHARGE OR ITCHING   Items with * indicate a potential emergency and should be followed up as soon as possible or go to the Emergency Department if any problems should occur.  Please show the CHEMOTHERAPY ALERT CARD or IMMUNOTHERAPY ALERT CARD at check-in to the  Emergency Department and triage nurse.  Should you have questions after your visit or need to cancel or reschedule your appointment, please contact [facility] [phone]  and follow the prompts.  Office hours are 8:00 a.m. to 4:30 p.m. Monday - Friday. Please note that voicemails left after 4:00 p.m. may not be returned until the following business day.  We are closed weekends and major holidays. You have access to a nurse at all times for urgent questions. Please call the main number to the clinic [phone] and follow the prompts.  For any non-urgent questions, you may also contact your provider using MyChart. We now offer e-Visits for anyone 65 and older to request care online for non-urgent symptoms. For details visit [email or web address].   Also download the MyChart app! Go to the app store, search "MyChart", open the app, select [facility], and log in with your MyChart username and password.  Masks are optional in the cancer centers. If you would like for your care team to wear a mask while they are taking care of you, please let them know. You may have one support person who is at least 86 years old accompany you for your appointments.

## 2022-08-27 NOTE — Progress Notes (Signed)
Recheck BP 188/79. Pt reports she did not take her blood pressure medication this morning. Dr. Delton Coombes made aware of recheck BP and that pt did not take her BP meds this morning. Okay to proceed with Aranesp injection per MD.

## 2022-08-31 LAB — CBC WITH DIFFERENTIAL/PLATELET
Abs Immature Granulocytes: 0 10*3/uL (ref 0.00–0.07)
Band Neutrophils: 2 %
Basophils Absolute: 0 10*3/uL (ref 0.0–0.1)
Basophils Relative: 1 %
Eosinophils Absolute: 0 10*3/uL (ref 0.0–0.5)
Eosinophils Relative: 0 %
HCT: 35.9 % — ABNORMAL LOW (ref 36.0–46.0)
Hemoglobin: 10 g/dL — ABNORMAL LOW (ref 12.0–15.0)
Lymphocytes Relative: 61 %
Lymphs Abs: 1.6 10*3/uL (ref 0.7–4.0)
MCH: 28.2 pg (ref 26.0–34.0)
MCHC: 27.9 g/dL — ABNORMAL LOW (ref 30.0–36.0)
MCV: 101.1 fL — ABNORMAL HIGH (ref 80.0–100.0)
Monocytes Absolute: 0.9 10*3/uL (ref 0.1–1.0)
Monocytes Relative: 33 %
Neutro Abs: 0.1 10*3/uL — CL (ref 1.7–7.7)
Neutrophils Relative %: 3 %
Platelets: 53 10*3/uL — ABNORMAL LOW (ref 150–400)
RBC: 3.55 MIL/uL — ABNORMAL LOW (ref 3.87–5.11)
RDW: 18.9 % — ABNORMAL HIGH (ref 11.5–15.5)
WBC Morphology: ABNORMAL
WBC: 2.6 10*3/uL — ABNORMAL LOW (ref 4.0–10.5)
nRBC: 3.4 % — ABNORMAL HIGH (ref 0.0–0.2)

## 2022-09-07 DIAGNOSIS — I10 Essential (primary) hypertension: Secondary | ICD-10-CM | POA: Diagnosis not present

## 2022-09-15 DIAGNOSIS — I10 Essential (primary) hypertension: Secondary | ICD-10-CM | POA: Diagnosis not present

## 2022-09-15 DIAGNOSIS — J449 Chronic obstructive pulmonary disease, unspecified: Secondary | ICD-10-CM | POA: Diagnosis not present

## 2022-09-23 ENCOUNTER — Inpatient Hospital Stay: Payer: Medicare Other | Attending: Hematology

## 2022-09-23 ENCOUNTER — Inpatient Hospital Stay: Payer: Medicare Other

## 2022-09-23 VITALS — BP 125/68 | HR 60 | Temp 97.6°F | Resp 16

## 2022-09-23 DIAGNOSIS — D469 Myelodysplastic syndrome, unspecified: Secondary | ICD-10-CM

## 2022-09-23 DIAGNOSIS — R197 Diarrhea, unspecified: Secondary | ICD-10-CM | POA: Diagnosis not present

## 2022-09-23 DIAGNOSIS — D4620 Refractory anemia with excess of blasts, unspecified: Secondary | ICD-10-CM | POA: Diagnosis not present

## 2022-09-23 DIAGNOSIS — D539 Nutritional anemia, unspecified: Secondary | ICD-10-CM | POA: Diagnosis not present

## 2022-09-23 DIAGNOSIS — Z853 Personal history of malignant neoplasm of breast: Secondary | ICD-10-CM | POA: Insufficient documentation

## 2022-09-23 DIAGNOSIS — Z85118 Personal history of other malignant neoplasm of bronchus and lung: Secondary | ICD-10-CM | POA: Diagnosis not present

## 2022-09-23 LAB — CBC
HCT: 36.2 % (ref 36.0–46.0)
Hemoglobin: 10 g/dL — ABNORMAL LOW (ref 12.0–15.0)
MCH: 27.5 pg (ref 26.0–34.0)
MCHC: 27.6 g/dL — ABNORMAL LOW (ref 30.0–36.0)
MCV: 99.7 fL (ref 80.0–100.0)
Platelets: 53 10*3/uL — ABNORMAL LOW (ref 150–400)
RBC: 3.63 MIL/uL — ABNORMAL LOW (ref 3.87–5.11)
RDW: 18.1 % — ABNORMAL HIGH (ref 11.5–15.5)
WBC: 2.2 10*3/uL — ABNORMAL LOW (ref 4.0–10.5)
nRBC: 3.6 % — ABNORMAL HIGH (ref 0.0–0.2)

## 2022-09-23 MED ORDER — DARBEPOETIN ALFA 200 MCG/0.4ML IJ SOSY
200.0000 ug | PREFILLED_SYRINGE | Freq: Once | INTRAMUSCULAR | Status: AC
  Administered 2022-09-23: 200 ug via SUBCUTANEOUS
  Filled 2022-09-23: qty 0.4

## 2022-09-23 NOTE — Patient Instructions (Signed)
[facility]  Discharge Instructions: Thank you for choosing [facility] to provide your oncology and hematology care.  If you have a lab appointment with the [facility], please come in thru the Main Entrance and check in at the main information desk.  Wear comfortable clothing and clothing appropriate for easy access to any Portacath or PICC line.   We strive to give you quality time with your provider. You may need to reschedule your appointment if you arrive late (15 or more minutes).  Arriving late affects you and other patients whose appointments are after yours.  Also, if you miss three or more appointments without notifying the office, you may be dismissed from the clinic at the provider's discretion.      For prescription refill requests, have your pharmacy contact our office and allow 72 hours for refills to be completed.    Today you received the following Aranesp, return as scheduled.   To help prevent nausea and vomiting after your treatment, we encourage you to take your nausea medication as directed.  BELOW ARE SYMPTOMS THAT SHOULD BE REPORTED IMMEDIATELY: *FEVER GREATER THAN 100.4 F (38 C) OR HIGHER *CHILLS OR SWEATING *NAUSEA AND VOMITING THAT IS NOT CONTROLLED WITH YOUR NAUSEA MEDICATION *UNUSUAL SHORTNESS OF BREATH *UNUSUAL BRUISING OR BLEEDING *URINARY PROBLEMS (pain or burning when urinating, or frequent urination) *BOWEL PROBLEMS (unusual diarrhea, constipation, pain near the anus) TENDERNESS IN MOUTH AND THROAT WITH OR WITHOUT PRESENCE OF ULCERS (sore throat, sores in mouth, or a toothache) UNUSUAL RASH, SWELLING OR PAIN  UNUSUAL VAGINAL DISCHARGE OR ITCHING   Items with * indicate a potential emergency and should be followed up as soon as possible or go to the Emergency Department if any problems should occur.  Please show the CHEMOTHERAPY ALERT CARD or IMMUNOTHERAPY ALERT CARD at check-in to the Emergency Department and triage  nurse.  Should you have questions after your visit or need to cancel or reschedule your appointment, please contact [facility] [phone]  and follow the prompts.  Office hours are 8:00 a.m. to 4:30 p.m. Monday - Friday. Please note that voicemails left after 4:00 p.m. may not be returned until the following business day.  We are closed weekends and major holidays. You have access to a nurse at all times for urgent questions. Please call the main number to the clinic [phone] and follow the prompts.  For any non-urgent questions, you may also contact your provider using MyChart. We now offer e-Visits for anyone 33 and older to request care online for non-urgent symptoms. For details visit [email or web address].   Also download the MyChart app! Go to the app store, search "MyChart", open the app, select [facility], and log in with your MyChart username and password.  Masks are optional in the cancer centers. If you would like for your care team to wear a mask while they are taking care of you, please let them know. You may have one support person who is at least 86 years old accompany you for your appointments.

## 2022-09-23 NOTE — Progress Notes (Signed)
Patient presents today for Aranesp injection. Hemoglobin reviewed prior to administration. VSS. Injection tolerated without incident or complaint. Patient stable during and after injection. See [name] for details. Patient discharged in satisfactory condition with no s/s of distress noted. 

## 2022-10-01 DIAGNOSIS — M79671 Pain in right foot: Secondary | ICD-10-CM | POA: Diagnosis not present

## 2022-10-01 DIAGNOSIS — E1142 Type 2 diabetes mellitus with diabetic polyneuropathy: Secondary | ICD-10-CM | POA: Diagnosis not present

## 2022-10-01 DIAGNOSIS — M79672 Pain in left foot: Secondary | ICD-10-CM | POA: Diagnosis not present

## 2022-10-05 DIAGNOSIS — N1832 Chronic kidney disease, stage 3b: Secondary | ICD-10-CM | POA: Diagnosis not present

## 2022-10-05 DIAGNOSIS — D469 Myelodysplastic syndrome, unspecified: Secondary | ICD-10-CM | POA: Diagnosis not present

## 2022-10-05 DIAGNOSIS — I10 Essential (primary) hypertension: Secondary | ICD-10-CM | POA: Diagnosis not present

## 2022-10-05 DIAGNOSIS — E039 Hypothyroidism, unspecified: Secondary | ICD-10-CM | POA: Diagnosis not present

## 2022-10-07 DIAGNOSIS — Z23 Encounter for immunization: Secondary | ICD-10-CM | POA: Diagnosis not present

## 2022-10-07 DIAGNOSIS — Z85118 Personal history of other malignant neoplasm of bronchus and lung: Secondary | ICD-10-CM | POA: Diagnosis not present

## 2022-10-07 DIAGNOSIS — Z853 Personal history of malignant neoplasm of breast: Secondary | ICD-10-CM | POA: Diagnosis not present

## 2022-10-07 DIAGNOSIS — G609 Hereditary and idiopathic neuropathy, unspecified: Secondary | ICD-10-CM | POA: Diagnosis not present

## 2022-10-07 DIAGNOSIS — R627 Adult failure to thrive: Secondary | ICD-10-CM | POA: Diagnosis not present

## 2022-10-07 DIAGNOSIS — I4891 Unspecified atrial fibrillation: Secondary | ICD-10-CM | POA: Diagnosis not present

## 2022-10-07 DIAGNOSIS — R809 Proteinuria, unspecified: Secondary | ICD-10-CM | POA: Diagnosis not present

## 2022-10-07 DIAGNOSIS — E039 Hypothyroidism, unspecified: Secondary | ICD-10-CM | POA: Diagnosis not present

## 2022-10-07 DIAGNOSIS — I10 Essential (primary) hypertension: Secondary | ICD-10-CM | POA: Diagnosis not present

## 2022-10-07 DIAGNOSIS — Z8673 Personal history of transient ischemic attack (TIA), and cerebral infarction without residual deficits: Secondary | ICD-10-CM | POA: Diagnosis not present

## 2022-10-07 DIAGNOSIS — D469 Myelodysplastic syndrome, unspecified: Secondary | ICD-10-CM | POA: Diagnosis not present

## 2022-10-07 DIAGNOSIS — R197 Diarrhea, unspecified: Secondary | ICD-10-CM | POA: Diagnosis not present

## 2022-10-07 DIAGNOSIS — N1832 Chronic kidney disease, stage 3b: Secondary | ICD-10-CM | POA: Diagnosis not present

## 2022-10-22 ENCOUNTER — Inpatient Hospital Stay: Payer: Medicare Other | Attending: Hematology

## 2022-10-22 ENCOUNTER — Inpatient Hospital Stay: Payer: Medicare Other

## 2022-10-22 VITALS — BP 121/54 | HR 69 | Temp 97.7°F | Resp 18

## 2022-10-22 DIAGNOSIS — D539 Nutritional anemia, unspecified: Secondary | ICD-10-CM

## 2022-10-22 DIAGNOSIS — M2550 Pain in unspecified joint: Secondary | ICD-10-CM | POA: Diagnosis not present

## 2022-10-22 DIAGNOSIS — D469 Myelodysplastic syndrome, unspecified: Secondary | ICD-10-CM

## 2022-10-22 DIAGNOSIS — R197 Diarrhea, unspecified: Secondary | ICD-10-CM | POA: Diagnosis not present

## 2022-10-22 DIAGNOSIS — Z79899 Other long term (current) drug therapy: Secondary | ICD-10-CM | POA: Diagnosis not present

## 2022-10-22 DIAGNOSIS — D4620 Refractory anemia with excess of blasts, unspecified: Secondary | ICD-10-CM | POA: Insufficient documentation

## 2022-10-22 DIAGNOSIS — Z853 Personal history of malignant neoplasm of breast: Secondary | ICD-10-CM | POA: Diagnosis not present

## 2022-10-22 DIAGNOSIS — R200 Anesthesia of skin: Secondary | ICD-10-CM | POA: Diagnosis not present

## 2022-10-22 DIAGNOSIS — Z85118 Personal history of other malignant neoplasm of bronchus and lung: Secondary | ICD-10-CM | POA: Diagnosis not present

## 2022-10-22 LAB — CBC
HCT: 33.9 % — ABNORMAL LOW (ref 36.0–46.0)
Hemoglobin: 9.4 g/dL — ABNORMAL LOW (ref 12.0–15.0)
MCH: 28.3 pg (ref 26.0–34.0)
MCHC: 27.7 g/dL — ABNORMAL LOW (ref 30.0–36.0)
MCV: 102.1 fL — ABNORMAL HIGH (ref 80.0–100.0)
Platelets: 52 10*3/uL — ABNORMAL LOW (ref 150–400)
RBC: 3.32 MIL/uL — ABNORMAL LOW (ref 3.87–5.11)
RDW: 18.5 % — ABNORMAL HIGH (ref 11.5–15.5)
WBC: 2.5 10*3/uL — ABNORMAL LOW (ref 4.0–10.5)
nRBC: 6.9 % — ABNORMAL HIGH (ref 0.0–0.2)

## 2022-10-22 MED ORDER — DARBEPOETIN ALFA 200 MCG/0.4ML IJ SOSY
200.0000 ug | PREFILLED_SYRINGE | Freq: Once | INTRAMUSCULAR | Status: AC
  Administered 2022-10-22: 200 ug via SUBCUTANEOUS
  Filled 2022-10-22: qty 0.4

## 2022-10-22 NOTE — Patient Instructions (Signed)
[facility]  Discharge Instructions: Thank you for choosing [facility] to provide your oncology and hematology care.  If you have a lab appointment with the [facility], please come in thru the Main Entrance and check in at the main information desk.  Wear comfortable clothing and clothing appropriate for easy access to any Portacath or PICC line.   We strive to give you quality time with your provider. You may need to reschedule your appointment if you arrive late (15 or more minutes).  Arriving late affects you and other patients whose appointments are after yours.  Also, if you miss three or more appointments without notifying the office, you may be dismissed from the clinic at the provider's discretion.      For prescription refill requests, have your pharmacy contact our office and allow 72 hours for refills to be completed.    Today you received the following Aranesp, return as scheduled.   To help prevent nausea and vomiting after your treatment, we encourage you to take your nausea medication as directed.  BELOW ARE SYMPTOMS THAT SHOULD BE REPORTED IMMEDIATELY: *FEVER GREATER THAN 100.4 F (38 C) OR HIGHER *CHILLS OR SWEATING *NAUSEA AND VOMITING THAT IS NOT CONTROLLED WITH YOUR NAUSEA MEDICATION *UNUSUAL SHORTNESS OF BREATH *UNUSUAL BRUISING OR BLEEDING *URINARY PROBLEMS (pain or burning when urinating, or frequent urination) *BOWEL PROBLEMS (unusual diarrhea, constipation, pain near the anus) TENDERNESS IN MOUTH AND THROAT WITH OR WITHOUT PRESENCE OF ULCERS (sore throat, sores in mouth, or a toothache) UNUSUAL RASH, SWELLING OR PAIN  UNUSUAL VAGINAL DISCHARGE OR ITCHING   Items with * indicate a potential emergency and should be followed up as soon as possible or go to the Emergency Department if any problems should occur.  Please show the CHEMOTHERAPY ALERT CARD or IMMUNOTHERAPY ALERT CARD at check-in to the Emergency Department and triage  nurse.  Should you have questions after your visit or need to cancel or reschedule your appointment, please contact [facility] [phone]  and follow the prompts.  Office hours are 8:00 a.m. to 4:30 p.m. Monday - Friday. Please note that voicemails left after 4:00 p.m. may not be returned until the following business day.  We are closed weekends and major holidays. You have access to a nurse at all times for urgent questions. Please call the main number to the clinic [phone] and follow the prompts.  For any non-urgent questions, you may also contact your provider using MyChart. We now offer e-Visits for anyone 51 and older to request care online for non-urgent symptoms. For details visit [email or web address].   Also download the MyChart app! Go to the app store, search "MyChart", open the app, select [facility], and log in with your MyChart username and password.  Masks are optional in the cancer centers. If you would like for your care team to wear a mask while they are taking care of you, please let them know. You may have one support person who is at least 86 years old accompany you for your appointments.

## 2022-10-22 NOTE — Progress Notes (Signed)
Patient presents today for Aranesp injection. Hemoglobin reviewed prior to administration. VSS. Injection tolerated without incident or complaint. Patient stable during and after injection. See [name] for details. Patient discharged in satisfactory condition with no s/s of distress noted. 

## 2022-11-03 ENCOUNTER — Emergency Department (HOSPITAL_COMMUNITY): Payer: Medicare Other

## 2022-11-03 ENCOUNTER — Encounter (HOSPITAL_COMMUNITY): Payer: Self-pay | Admitting: Hematology and Oncology

## 2022-11-03 ENCOUNTER — Inpatient Hospital Stay (HOSPITAL_COMMUNITY)
Admission: EM | Admit: 2022-11-03 | Discharge: 2022-11-07 | DRG: 291 | Disposition: A | Discharge status: Home Health | Payer: Medicare Other | Attending: Internal Medicine | Admitting: Internal Medicine

## 2022-11-03 ENCOUNTER — Other Ambulatory Visit: Payer: Self-pay

## 2022-11-03 ENCOUNTER — Encounter (HOSPITAL_COMMUNITY): Payer: Self-pay | Admitting: Internal Medicine

## 2022-11-03 ENCOUNTER — Institutional Professional Consult (permissible substitution): Payer: Medicare Other | Admitting: Internal Medicine

## 2022-11-03 DIAGNOSIS — R0902 Hypoxemia: Secondary | ICD-10-CM | POA: Diagnosis not present

## 2022-11-03 DIAGNOSIS — I130 Hypertensive heart and chronic kidney disease with heart failure and stage 1 through stage 4 chronic kidney disease, or unspecified chronic kidney disease: Secondary | ICD-10-CM | POA: Diagnosis not present

## 2022-11-03 DIAGNOSIS — K828 Other specified diseases of gallbladder: Secondary | ICD-10-CM | POA: Diagnosis not present

## 2022-11-03 DIAGNOSIS — E871 Hypo-osmolality and hyponatremia: Secondary | ICD-10-CM | POA: Diagnosis present

## 2022-11-03 DIAGNOSIS — N183 Chronic kidney disease, stage 3 unspecified: Secondary | ICD-10-CM | POA: Diagnosis present

## 2022-11-03 DIAGNOSIS — Z1152 Encounter for screening for COVID-19: Secondary | ICD-10-CM | POA: Diagnosis not present

## 2022-11-03 DIAGNOSIS — E039 Hypothyroidism, unspecified: Secondary | ICD-10-CM | POA: Diagnosis present

## 2022-11-03 DIAGNOSIS — J168 Pneumonia due to other specified infectious organisms: Secondary | ICD-10-CM | POA: Diagnosis not present

## 2022-11-03 DIAGNOSIS — I2489 Other forms of acute ischemic heart disease: Secondary | ICD-10-CM | POA: Diagnosis present

## 2022-11-03 DIAGNOSIS — Z7951 Long term (current) use of inhaled steroids: Secondary | ICD-10-CM | POA: Diagnosis not present

## 2022-11-03 DIAGNOSIS — J189 Pneumonia, unspecified organism: Secondary | ICD-10-CM | POA: Diagnosis present

## 2022-11-03 DIAGNOSIS — I509 Heart failure, unspecified: Secondary | ICD-10-CM

## 2022-11-03 DIAGNOSIS — Z85118 Personal history of other malignant neoplasm of bronchus and lung: Secondary | ICD-10-CM

## 2022-11-03 DIAGNOSIS — D61818 Other pancytopenia: Secondary | ICD-10-CM | POA: Diagnosis present

## 2022-11-03 DIAGNOSIS — I5033 Acute on chronic diastolic (congestive) heart failure: Secondary | ICD-10-CM | POA: Diagnosis present

## 2022-11-03 DIAGNOSIS — I4891 Unspecified atrial fibrillation: Secondary | ICD-10-CM | POA: Diagnosis present

## 2022-11-03 DIAGNOSIS — I714 Abdominal aortic aneurysm, without rupture, unspecified: Secondary | ICD-10-CM | POA: Diagnosis present

## 2022-11-03 DIAGNOSIS — J441 Chronic obstructive pulmonary disease with (acute) exacerbation: Principal | ICD-10-CM | POA: Diagnosis present

## 2022-11-03 DIAGNOSIS — J90 Pleural effusion, not elsewhere classified: Secondary | ICD-10-CM | POA: Diagnosis not present

## 2022-11-03 DIAGNOSIS — Z886 Allergy status to analgesic agent status: Secondary | ICD-10-CM

## 2022-11-03 DIAGNOSIS — E875 Hyperkalemia: Secondary | ICD-10-CM | POA: Diagnosis present

## 2022-11-03 DIAGNOSIS — J9621 Acute and chronic respiratory failure with hypoxia: Secondary | ICD-10-CM | POA: Diagnosis not present

## 2022-11-03 DIAGNOSIS — Z79890 Hormone replacement therapy (postmenopausal): Secondary | ICD-10-CM | POA: Diagnosis not present

## 2022-11-03 DIAGNOSIS — Z888 Allergy status to other drugs, medicaments and biological substances status: Secondary | ICD-10-CM

## 2022-11-03 DIAGNOSIS — I5032 Chronic diastolic (congestive) heart failure: Secondary | ICD-10-CM | POA: Diagnosis not present

## 2022-11-03 DIAGNOSIS — J449 Chronic obstructive pulmonary disease, unspecified: Secondary | ICD-10-CM | POA: Diagnosis present

## 2022-11-03 DIAGNOSIS — Z923 Personal history of irradiation: Secondary | ICD-10-CM

## 2022-11-03 DIAGNOSIS — R197 Diarrhea, unspecified: Secondary | ICD-10-CM | POA: Diagnosis present

## 2022-11-03 DIAGNOSIS — J470 Bronchiectasis with acute lower respiratory infection: Secondary | ICD-10-CM | POA: Diagnosis present

## 2022-11-03 DIAGNOSIS — Z79899 Other long term (current) drug therapy: Secondary | ICD-10-CM | POA: Diagnosis not present

## 2022-11-03 DIAGNOSIS — N1831 Chronic kidney disease, stage 3a: Secondary | ICD-10-CM | POA: Diagnosis present

## 2022-11-03 DIAGNOSIS — D469 Myelodysplastic syndrome, unspecified: Secondary | ICD-10-CM | POA: Diagnosis present

## 2022-11-03 DIAGNOSIS — I110 Hypertensive heart disease with heart failure: Secondary | ICD-10-CM | POA: Diagnosis not present

## 2022-11-03 DIAGNOSIS — Z9981 Dependence on supplemental oxygen: Secondary | ICD-10-CM

## 2022-11-03 DIAGNOSIS — N1832 Chronic kidney disease, stage 3b: Secondary | ICD-10-CM | POA: Diagnosis present

## 2022-11-03 DIAGNOSIS — R059 Cough, unspecified: Secondary | ICD-10-CM | POA: Diagnosis not present

## 2022-11-03 DIAGNOSIS — Z7982 Long term (current) use of aspirin: Secondary | ICD-10-CM

## 2022-11-03 DIAGNOSIS — I10 Essential (primary) hypertension: Secondary | ICD-10-CM | POA: Diagnosis present

## 2022-11-03 DIAGNOSIS — Z853 Personal history of malignant neoplasm of breast: Secondary | ICD-10-CM

## 2022-11-03 DIAGNOSIS — I5031 Acute diastolic (congestive) heart failure: Secondary | ICD-10-CM | POA: Diagnosis not present

## 2022-11-03 DIAGNOSIS — J440 Chronic obstructive pulmonary disease with acute lower respiratory infection: Secondary | ICD-10-CM | POA: Diagnosis not present

## 2022-11-03 DIAGNOSIS — R0602 Shortness of breath: Secondary | ICD-10-CM | POA: Diagnosis not present

## 2022-11-03 DIAGNOSIS — Z87891 Personal history of nicotine dependence: Secondary | ICD-10-CM

## 2022-11-03 LAB — COMPREHENSIVE METABOLIC PANEL
ALT: 45 U/L — ABNORMAL HIGH (ref 0–44)
AST: 65 U/L — ABNORMAL HIGH (ref 15–41)
Albumin: 3.5 g/dL (ref 3.5–5.0)
Alkaline Phosphatase: 65 U/L (ref 38–126)
Anion gap: 6 (ref 5–15)
BUN: 34 mg/dL — ABNORMAL HIGH (ref 8–23)
CO2: 20 mmol/L — ABNORMAL LOW (ref 22–32)
Calcium: 7.9 mg/dL — ABNORMAL LOW (ref 8.9–10.3)
Chloride: 98 mmol/L (ref 98–111)
Creatinine, Ser: 1.12 mg/dL — ABNORMAL HIGH (ref 0.44–1.00)
GFR, Estimated: 48 mL/min — ABNORMAL LOW (ref >60)
Glucose, Bld: 113 mg/dL — ABNORMAL HIGH (ref 70–99)
Potassium: 6.3 mmol/L (ref 3.5–5.1)
Sodium: 124 mmol/L — ABNORMAL LOW (ref 135–145)
Total Bilirubin: 0.8 mg/dL (ref 0.3–1.2)
Total Protein: 9.2 g/dL — ABNORMAL HIGH (ref 6.5–8.1)

## 2022-11-03 LAB — CBC WITH DIFFERENTIAL/PLATELET
Abs Immature Granulocytes: 0 10*3/uL (ref 0.00–0.07)
Band Neutrophils: 1 %
Basophils Absolute: 0 10*3/uL (ref 0.0–0.1)
Basophils Relative: 0 %
Eosinophils Absolute: 0 10*3/uL (ref 0.0–0.5)
Eosinophils Relative: 0 %
HCT: 35.6 % — ABNORMAL LOW (ref 36.0–46.0)
Hemoglobin: 9.7 g/dL — ABNORMAL LOW (ref 12.0–15.0)
Lymphocytes Relative: 40 %
Lymphs Abs: 1.8 10*3/uL (ref 0.7–4.0)
MCH: 28.4 pg (ref 26.0–34.0)
MCHC: 27.2 g/dL — ABNORMAL LOW (ref 30.0–36.0)
MCV: 104.4 fL — ABNORMAL HIGH (ref 80.0–100.0)
Metamyelocytes Relative: 1 %
Monocytes Absolute: 2.4 10*3/uL — ABNORMAL HIGH (ref 0.1–1.0)
Monocytes Relative: 54 %
Neutro Abs: 0.2 10*3/uL — CL (ref 1.7–7.7)
Neutrophils Relative %: 4 %
Platelets: 73 10*3/uL — ABNORMAL LOW (ref 150–400)
RBC: 3.41 MIL/uL — ABNORMAL LOW (ref 3.87–5.11)
RDW: 19.2 % — ABNORMAL HIGH (ref 11.5–15.5)
Smear Review: DECREASED
WBC: 4.4 10*3/uL (ref 4.0–10.5)
nRBC: 24 /100 WBC — ABNORMAL HIGH
nRBC: 29.3 % — ABNORMAL HIGH (ref 0.0–0.2)

## 2022-11-03 LAB — MAGNESIUM: Magnesium: 2.4 mg/dL (ref 1.7–2.4)

## 2022-11-03 LAB — LACTATE DEHYDROGENASE: LDH: 314 U/L — ABNORMAL HIGH (ref 98–192)

## 2022-11-03 LAB — URINALYSIS, ROUTINE W REFLEX MICROSCOPIC
Bilirubin Urine: NEGATIVE
Glucose, UA: NEGATIVE mg/dL
Hgb urine dipstick: NEGATIVE
Ketones, ur: NEGATIVE mg/dL
Leukocytes,Ua: NEGATIVE
Nitrite: NEGATIVE
Protein, ur: NEGATIVE mg/dL
Specific Gravity, Urine: 1.008 (ref 1.005–1.030)
pH: 5 (ref 5.0–8.0)

## 2022-11-03 LAB — TSH: TSH: 0.43 u[IU]/mL (ref 0.350–4.500)

## 2022-11-03 LAB — RESP PANEL BY RT-PCR (RSV, FLU A&B, COVID)  RVPGX2
Influenza A by PCR: NEGATIVE
Influenza B by PCR: NEGATIVE
Resp Syncytial Virus by PCR: NEGATIVE
SARS Coronavirus 2 by RT PCR: NEGATIVE

## 2022-11-03 LAB — TROPONIN I (HIGH SENSITIVITY)
Troponin I (High Sensitivity): 310 ng/L (ref <18)
Troponin I (High Sensitivity): 296 ng/L (ref <18)

## 2022-11-03 LAB — BASIC METABOLIC PANEL
Anion gap: 7 (ref 5–15)
BUN: 32 mg/dL — ABNORMAL HIGH (ref 8–23)
CO2: 20 mmol/L — ABNORMAL LOW (ref 22–32)
Calcium: 8.1 mg/dL — ABNORMAL LOW (ref 8.9–10.3)
Chloride: 99 mmol/L (ref 98–111)
Creatinine, Ser: 1.04 mg/dL — ABNORMAL HIGH (ref 0.44–1.00)
GFR, Estimated: 52 mL/min — ABNORMAL LOW (ref >60)
Glucose, Bld: 109 mg/dL — ABNORMAL HIGH (ref 70–99)
Potassium: 5.2 mmol/L — ABNORMAL HIGH (ref 3.5–5.1)
Sodium: 126 mmol/L — ABNORMAL LOW (ref 135–145)

## 2022-11-03 LAB — BRAIN NATRIURETIC PEPTIDE: B Natriuretic Peptide: 743 pg/mL — ABNORMAL HIGH (ref 0.0–100.0)

## 2022-11-03 LAB — URIC ACID: Uric Acid, Serum: 8.1 mg/dL — ABNORMAL HIGH (ref 2.5–7.1)

## 2022-11-03 LAB — RETICULOCYTES
Immature Retic Fract: 32.3 % — ABNORMAL HIGH (ref 2.3–15.9)
RBC.: 3.4 MIL/uL — ABNORMAL LOW (ref 3.87–5.11)
Retic Count, Absolute: 140.1 10*3/uL (ref 19.0–186.0)
Retic Ct Pct: 4.1 % — ABNORMAL HIGH (ref 0.4–3.1)

## 2022-11-03 LAB — D-DIMER, QUANTITATIVE: D-Dimer, Quant: 8.09 ug/mL-FEU — ABNORMAL HIGH (ref 0.00–0.50)

## 2022-11-03 LAB — PHOSPHORUS: Phosphorus: 3.8 mg/dL (ref 2.5–4.6)

## 2022-11-03 MED ORDER — GABAPENTIN 100 MG PO CAPS: 100.0000 mg | ORAL_CAPSULE | Freq: Two times a day (BID) | ORAL | Status: DC | PRN

## 2022-11-03 MED ORDER — IPRATROPIUM-ALBUTEROL 0.5-2.5 (3) MG/3ML IN SOLN
3.0000 mL | Freq: Once | RESPIRATORY_TRACT | Status: AC
  Administered 2022-11-03: 3 mL via RESPIRATORY_TRACT
  Filled 2022-11-03: qty 3

## 2022-11-03 MED ORDER — ONDANSETRON HCL 4 MG PO TABS: 4.0000 mg | ORAL_TABLET | Freq: Four times a day (QID) | ORAL | Status: DC | PRN

## 2022-11-03 MED ORDER — SODIUM CHLORIDE 0.9 % IV SOLN
250.0000 mL | INTRAVENOUS | Status: DC | PRN
  Administered 2022-11-04: 250 mL via INTRAVENOUS

## 2022-11-03 MED ORDER — SODIUM CHLORIDE 0.9% FLUSH: 3.0000 mL | INTRAVENOUS | Status: DC | PRN

## 2022-11-03 MED ORDER — FUROSEMIDE 10 MG/ML IJ SOLN
40.0000 mg | Freq: Two times a day (BID) | INTRAMUSCULAR | Status: DC
  Administered 2022-11-03 – 2022-11-05 (×4): 40 mg via INTRAVENOUS
  Filled 2022-11-03 (×4): qty 4

## 2022-11-03 MED ORDER — FUROSEMIDE 10 MG/ML IJ SOLN
60.0000 mg | Freq: Once | INTRAMUSCULAR | Status: AC
  Administered 2022-11-03: 60 mg via INTRAVENOUS
  Filled 2022-11-03: qty 6

## 2022-11-03 MED ORDER — ADULT MULTIVITAMIN W/MINERALS CH
1.0000 | ORAL_TABLET | Freq: Every day | ORAL | Status: DC
  Administered 2022-11-03 – 2022-11-07 (×5): 1 via ORAL
  Filled 2022-11-03 (×5): qty 1

## 2022-11-03 MED ORDER — SODIUM BICARBONATE 8.4 % IV SOLN
INTRAVENOUS | Status: AC
  Filled 2022-11-03: qty 50

## 2022-11-03 MED ORDER — DEXTROSE 50 % IV SOLN
1.0000 | Freq: Once | INTRAVENOUS | Status: AC
  Administered 2022-11-03: 50 mL via INTRAVENOUS
  Filled 2022-11-03: qty 50

## 2022-11-03 MED ORDER — ACETAMINOPHEN 325 MG PO TABS: 650.0000 mg | ORAL_TABLET | Freq: Four times a day (QID) | ORAL | Status: DC | PRN

## 2022-11-03 MED ORDER — ASPIRIN 81 MG PO TBEC
81.0000 mg | DELAYED_RELEASE_TABLET | Freq: Every day | ORAL | Status: DC
  Administered 2022-11-03 – 2022-11-07 (×5): 81 mg via ORAL
  Filled 2022-11-03 (×5): qty 1

## 2022-11-03 MED ORDER — SODIUM CHLORIDE 0.9 % IV SOLN
2.0000 g | Freq: Two times a day (BID) | INTRAVENOUS | Status: DC
  Administered 2022-11-03 – 2022-11-05 (×4): 2 g via INTRAVENOUS
  Filled 2022-11-03 (×4): qty 12.5

## 2022-11-03 MED ORDER — ARFORMOTEROL TARTRATE 15 MCG/2ML IN NEBU
15.0000 ug | INHALATION_SOLUTION | Freq: Two times a day (BID) | RESPIRATORY_TRACT | Status: DC
  Administered 2022-11-03 – 2022-11-07 (×8): 15 ug via RESPIRATORY_TRACT
  Filled 2022-11-03 (×8): qty 2

## 2022-11-03 MED ORDER — CHLORHEXIDINE GLUCONATE CLOTH 2 % EX PADS
6.0000 | MEDICATED_PAD | Freq: Every day | CUTANEOUS | Status: DC
  Administered 2022-11-04 – 2022-11-07 (×4): 6 via TOPICAL

## 2022-11-03 MED ORDER — ALBUTEROL SULFATE (2.5 MG/3ML) 0.083% IN NEBU: 2.5000 mg | INHALATION_SOLUTION | RESPIRATORY_TRACT | Status: DC | PRN

## 2022-11-03 MED ORDER — SODIUM BICARBONATE 8.4 % IV SOLN
50.0000 meq | Freq: Once | INTRAVENOUS | Status: AC
  Administered 2022-11-03: 50 meq via INTRAVENOUS

## 2022-11-03 MED ORDER — PREDNISONE 20 MG PO TABS: 40.0000 mg | ORAL_TABLET | Freq: Every day | ORAL | Status: DC

## 2022-11-03 MED ORDER — SODIUM CHLORIDE 0.9% FLUSH
3.0000 mL | Freq: Two times a day (BID) | INTRAVENOUS | Status: DC
  Administered 2022-11-03 – 2022-11-07 (×7): 3 mL via INTRAVENOUS

## 2022-11-03 MED ORDER — CALCIUM GLUCONATE-NACL 1-0.675 GM/50ML-% IV SOLN
1.0000 g | Freq: Once | INTRAVENOUS | Status: AC
  Administered 2022-11-03: 1000 mg via INTRAVENOUS
  Filled 2022-11-03: qty 50

## 2022-11-03 MED ORDER — ACETAMINOPHEN 650 MG RE SUPP: 650.0000 mg | Freq: Four times a day (QID) | RECTAL | Status: DC | PRN

## 2022-11-03 MED ORDER — METHYLPREDNISOLONE SODIUM SUCC 40 MG IJ SOLR
40.0000 mg | Freq: Two times a day (BID) | INTRAMUSCULAR | Status: AC
  Administered 2022-11-03 – 2022-11-06 (×6): 40 mg via INTRAVENOUS
  Filled 2022-11-03 (×6): qty 1

## 2022-11-03 MED ORDER — LEVOTHYROXINE SODIUM 100 MCG PO TABS
100.0000 ug | ORAL_TABLET | Freq: Every day | ORAL | Status: DC
  Administered 2022-11-04 – 2022-11-07 (×4): 100 ug via ORAL
  Filled 2022-11-03 (×4): qty 1

## 2022-11-03 MED ORDER — IOHEXOL 350 MG/ML SOLN
75.0000 mL | Freq: Once | INTRAVENOUS | Status: AC | PRN
  Administered 2022-11-03: 75 mL via INTRAVENOUS

## 2022-11-03 MED ORDER — SODIUM CHLORIDE 0.9 % IV SOLN: 2.0000 g | Freq: Three times a day (TID) | INTRAVENOUS | Status: DC

## 2022-11-03 MED ORDER — SODIUM CHLORIDE 0.9 % IV SOLN
2.0000 g | INTRAVENOUS | Status: AC
  Administered 2022-11-03: 2 g via INTRAVENOUS
  Filled 2022-11-03: qty 20

## 2022-11-03 MED ORDER — ALBUTEROL SULFATE HFA 108 (90 BASE) MCG/ACT IN AERS: 2.0000 | INHALATION_SPRAY | RESPIRATORY_TRACT | Status: DC | PRN

## 2022-11-03 MED ORDER — ALBUTEROL SULFATE (2.5 MG/3ML) 0.083% IN NEBU
10.0000 mg | INHALATION_SOLUTION | Freq: Once | RESPIRATORY_TRACT | Status: AC
  Administered 2022-11-03: 10 mg via RESPIRATORY_TRACT
  Filled 2022-11-03: qty 12

## 2022-11-03 MED ORDER — BUDESONIDE 0.5 MG/2ML IN SUSP
0.5000 mg | Freq: Two times a day (BID) | RESPIRATORY_TRACT | Status: DC
  Administered 2022-11-03 – 2022-11-07 (×8): 0.5 mg via RESPIRATORY_TRACT
  Filled 2022-11-03 (×8): qty 2

## 2022-11-03 MED ORDER — METOPROLOL SUCCINATE ER 25 MG PO TB24
25.0000 mg | ORAL_TABLET | Freq: Every day | ORAL | Status: DC
  Administered 2022-11-03 – 2022-11-07 (×5): 25 mg via ORAL
  Filled 2022-11-03 (×5): qty 1

## 2022-11-03 MED ORDER — INSULIN ASPART 100 UNIT/ML IV SOLN
5.0000 [IU] | Freq: Once | INTRAVENOUS | Status: AC
  Administered 2022-11-03: 5 [IU] via INTRAVENOUS

## 2022-11-03 MED ORDER — MIRTAZAPINE 15 MG PO TABS
7.5000 mg | ORAL_TABLET | Freq: Every day | ORAL | Status: DC
  Administered 2022-11-03 – 2022-11-06 (×4): 7.5 mg via ORAL
  Filled 2022-11-03 (×4): qty 1

## 2022-11-03 MED ORDER — SODIUM ZIRCONIUM CYCLOSILICATE 5 G PO PACK
10.0000 g | PACK | ORAL | Status: AC
  Administered 2022-11-03: 10 g via ORAL
  Filled 2022-11-03: qty 2

## 2022-11-03 MED ORDER — ONDANSETRON HCL 4 MG/2ML IJ SOLN: 4.0000 mg | Freq: Four times a day (QID) | INTRAMUSCULAR | Status: DC | PRN

## 2022-11-03 MED ORDER — IPRATROPIUM-ALBUTEROL 0.5-2.5 (3) MG/3ML IN SOLN
3.0000 mL | Freq: Four times a day (QID) | RESPIRATORY_TRACT | Status: DC
  Administered 2022-11-03 – 2022-11-04 (×5): 3 mL via RESPIRATORY_TRACT
  Filled 2022-11-03 (×5): qty 3

## 2022-11-03 MED ORDER — SODIUM CHLORIDE 0.9 % IV SOLN
500.0000 mg | INTRAVENOUS | Status: AC
  Administered 2022-11-03: 500 mg via INTRAVENOUS
  Filled 2022-11-03: qty 5

## 2022-11-03 MED ORDER — PANTOPRAZOLE SODIUM 40 MG PO TBEC
40.0000 mg | DELAYED_RELEASE_TABLET | Freq: Every day | ORAL | Status: DC
  Administered 2022-11-03 – 2022-11-07 (×5): 40 mg via ORAL
  Filled 2022-11-03 (×5): qty 1

## 2022-11-03 NOTE — Assessment & Plan Note (Signed)
-  Appears to be a stable and at baseline currently -Closely follow electrolytes trend and renal function trend in the setting of acute diuresis. -Strict I's and O's has been ordered.

## 2022-11-03 NOTE — ED Notes (Signed)
All pt belongings is sent home with family.

## 2022-11-03 NOTE — Assessment & Plan Note (Signed)
-  Slightly trigger by early pneumonia versus bronchiectasis -Provide treatment with bronchodilators, nebulizer management, mucolytic's, antitussive medications and IV steroids.   -patient started also on antibiotics

## 2022-11-03 NOTE — ED Notes (Signed)
Changed and cleaned pt

## 2022-11-03 NOTE — Progress Notes (Deleted)
   Lisa [name], female    DOB: 05-05-1934    MRN: [id number]   Brief patient profile:  ***  yo*** *** referred to pulmonary clinic in [place]  11/03/2022 by *** for ***      History of Present Illness  11/03/2022  Pulmonary/ 1st office eval/ Sherene Sires / [place] [facility]  No chief complaint on file.    Dyspnea:  *** Cough: *** Sleep: *** SABA use: *** 02: *** Lung cancer screen: ***  Past Medical History:  Diagnosis Date   Adenocarcinoma of left lung (HCC) 2006   Arthritis    Asthma    Cancer of breast, female (HCC)    Cancer of lung (HCC)    Hypertension    Hypothyroidism    Invasive ductal carcinoma of left breast (HCC) 1999   Neutropenia (HCC) 06/09/2016   Personal history of radiation therapy     Outpatient Medications Prior to Visit  Medication Sig Dispense Refill   acetaminophen (TYLENOL) 325 MG tablet Take 2 tablets (650 mg total) by mouth every 6 (six) hours.     albuterol (VENTOLIN HFA) 108 (90 Base) MCG/ACT inhaler Inhale into the lungs.     amLODipine (NORVASC) 10 MG tablet Take 10 mg by mouth daily.     aspirin EC 81 MG EC tablet Take 1 tablet (81 mg total) by mouth daily. Swallow whole. 30 tablet 11   gabapentin (NEURONTIN) 100 MG capsule Take 1 capsule (100 mg total) by mouth every 12 (twelve) hours as needed (neuropathy). 60 capsule 1   levothyroxine (SYNTHROID) 100 MCG tablet Take 1 tablet (100 mcg total) by mouth daily before breakfast. 30 tablet 1   magnesium oxide (MAG-OX) 400 MG tablet Take 400 mg by mouth 2 (two) times daily.     metoprolol succinate (TOPROL XL) 25 MG 24 hr tablet Take 1 tablet (25 mg total) by mouth daily. 90 tablet 2   mirtazapine (REMERON) 7.5 MG tablet Take 1 tablet (7.5 mg total) by mouth at bedtime. 30 tablet 2   Multiple Vitamin (MULTI-VITAMIN) tablet Take 1 tablet by mouth daily.     OXYGEN Inhale 2 L into the lungs at bedtime.     senna-docusate (SENOKOT-S) 8.6-50 MG tablet Take 1 tablet by mouth at bedtime.     sodium chloride  1 g tablet Take 1 tablet (1 g total) by mouth daily. 30 tablet 1   umeclidinium bromide (INCRUSE ELLIPTA) 62.5 MCG/INH AEPB Inhale into the lungs daily.     Vitamin D, Ergocalciferol, (DRISDOL) 1.25 MG (50000 UNIT) CAPS capsule Take 50,000 Units by mouth once a week.     No facility-administered medications prior to visit.     Objective:     There were no vitals taken for this visit.         Assessment   No problem-specific Assessment & Plan notes found for this encounter.     Sandrea Hughs, MD 11/03/2022

## 2022-11-03 NOTE — ED Triage Notes (Signed)
Pt arrived [facility] for SOB. Pt at home on RA and oxygen 65%. Pt has had trouble urinating & diarrhea x 3 days with a congested nonproductive cough. Pt had a pulmonologist appt today set up. Pt received albuterol tx on EMS. PT alert and oriented. 6 lpm- 92%.

## 2022-11-03 NOTE — Assessment & Plan Note (Signed)
-  Appears to be larger than previous assessment, now reaching 4.8 cm -Outpatient follow-up with vascular surgery recommended -Repeat CT abdomen/MRI in 42-month to further assess stability.

## 2022-11-03 NOTE — H&P (Signed)
History and Physical    Patient: Lisa [name] GEZ:[id number] DOB: 07-06-1934 DOA: 11/03/2022 DOS: the patient was seen and examined on 11/03/2022 PCP: Celene Squibb, MD  Patient coming from: Home  Chief Complaint:  Chief Complaint  Patient presents with   Shortness of Breath   HPI: Lisa [name] is a 89 y.o. female with medical history significant of history of lung cancer, breast cancer and MDS, pancytopenia, asthma/COPD and diastolic heart failure; who presented to the hospital secondary to shortness of breath, decreased oral intake, loose stools, anorexia and productive coughing spells.  Patient reports symptom has been present for the last 4 days prior to admission.  Some of her loose stool has been dark in color after using Pepto-Bismol.  No hematemesis, no vomiting, no chest pain, no fever, no sick contacts, no focal weakness, no dysuria or any other complaints. Patient reports some orthopnea the night prior to admission and has also noticed unsure when the sensation.  Workup in the ED demonstrated elevated D-dimer with subsequent CT angiogram negative for pulmonary embolism but demonstrating vascular congestion suggesting CHF exacerbation and unable to rule out the presence of midlung infiltrate/pneumonia.  BNP was elevated and on physical examination patient found to be hypoxic requiring up to 15 L high flow nasal cannula supplementation to stabilize saturation.  Patient was wheezing and demonstrating poor air movement.  Steroids, bronchodilators, antibiotics and IV Lasix provided at time of admission.  TRH contacted to place patient in the hospital for further evaluation and management.  Review of Systems: As mentioned in the history of present illness. All other systems reviewed and are negative. Past Medical History:  Diagnosis Date   Adenocarcinoma of left lung ([place]) 2006   Arthritis    Asthma    Cancer of breast, female (Lisa [name])    Cancer of lung ([place])    Hypertension     Hypothyroidism    Invasive ductal carcinoma of left breast ([place]) 1999   Neutropenia ([place]) 06/09/2016   Personal history of radiation therapy    Past Surgical History:  Procedure Laterality Date   BIOPSY  10/23/2020   Procedure: BIOPSY;  Surgeon: Yetta Flock, MD;  Location: [facility] ENDOSCOPY;  Service: Gastroenterology;;   BREAST BIOPSY     left axillary node dissection   BREAST LUMPECTOMY Left    COLONOSCOPY  03/08/2003   [id number]:[id number] rectal polyps destroyed with the tip of the snare/Polyps at hepatic flexure, splenic flexure at 68 cm/Left-sided diverticula: unable to retrieve path   COLONOSCOPY  09/12/2008   NTZ:[id number] rectum and distal sigmoid diminutive polyps/scattered left sided diverticulum. hyperplastic   COLONOSCOPY N/A 03/06/2013   S[facility]:QPRFFMB polyp-removed as described above; colonic diverticulosis. hyperplastic polyps. next TCS 03/2018   ESOPHAGOGASTRODUODENOSCOPY (EGD) WITH PROPOFOL N/A 10/23/2020   Procedure: ESOPHAGOGASTRODUODENOSCOPY (EGD) WITH PROPOFOL;  Surgeon: Yetta Flock, MD;  Location: [facility];  Service: Gastroenterology;  Laterality: N/A;   FOOT SURGERY     LUNG REMOVAL, PARTIAL     upper lobe   Social History:  reports that she quit smoking about 31 years ago. Her smoking use included cigarettes. She has a 26.25 pack-year smoking history. She has never used smokeless tobacco. She reports that she does not drink alcohol and does not use drugs.  Allergies  Allergen Reactions   Meloxicam Other (See Comments)    Caused an injury to the kidneys, per nephrologist   Micardis Hct [Telmisartan-Hctz] Other (See Comments)    Caused an injury to the kidneys, per nephrologist  Family History  Problem Relation Age of Onset   Cancer Mother    Cancer Sister    Colon cancer Neg Hx     Prior to Admission medications   Medication Sig Start Date End Date Taking? Authorizing Provider  acetaminophen (TYLENOL) 325 MG tablet Take 2 tablets (650 mg total) by  mouth every 6 (six) hours. 11/11/20  Yes Angiulli, Lavon Paganini, PA-C  albuterol (VENTOLIN HFA) 108 (90 Base) MCG/ACT inhaler Inhale 1-2 puffs into the lungs every 4 (four) hours as needed for wheezing or shortness of breath. 05/25/22  Yes [provider]  amLODipine (NORVASC) 10 MG tablet Take 10 mg by mouth daily. 09/07/22  Yes [provider]  aspirin EC 81 MG EC tablet Take 1 tablet (81 mg total) by mouth daily. Swallow whole. 02/12/22  Yes Barton Dubois, MD  gabapentin (NEURONTIN) 100 MG capsule Take 1 capsule (100 mg total) by mouth every 12 (twelve) hours as needed (neuropathy). 02/11/22 02/11/23 Yes Barton Dubois, MD  levothyroxine (SYNTHROID) 100 MCG tablet Take 1 tablet (100 mcg total) by mouth daily before breakfast. 08/28/21  Yes Johnson, Clanford L, MD  magnesium oxide (MAG-OX) 400 MG tablet Take 400 mg by mouth 2 (two) times daily.   Yes [provider]  metoprolol succinate (TOPROL XL) 25 MG 24 hr tablet Take 1 tablet (25 mg total) by mouth daily. 06/03/22  Yes Strader, [place] M, PA-C  mirtazapine (REMERON) 7.5 MG tablet Take 1 tablet (7.5 mg total) by mouth at bedtime. 05/28/21  Yes Heath Lark, MD  Multiple Vitamin (MULTI-VITAMIN) tablet Take 1 tablet by mouth daily.   Yes [provider]  OXYGEN Inhale 2 L into the lungs at bedtime.   Yes [provider]  sodium chloride 1 g tablet Take 1 tablet (1 g total) by mouth daily. 05/07/22  Yes Derek Jack, MD  umeclidinium bromide (INCRUSE ELLIPTA) 62.5 MCG/INH AEPB Inhale 1 puff into the lungs daily. 12/31/20  Yes [provider]  senna-docusate (SENOKOT-S) 8.6-50 MG tablet Take 1 tablet by mouth at bedtime. Patient not taking: Reported on 11/03/2022 11/11/20   Cathlyn Parsons, PA-C  Vitamin D, Ergocalciferol, (DRISDOL) 1.25 MG (50000 UNIT) CAPS capsule Take 50,000 Units by mouth once a week. 01/10/21   [provider]    Physical Exam: Vitals:   11/03/22 1549 11/03/22 1600  11/03/22 1700 11/03/22 1800  BP: (!) 110/92 117/81 (!) 117/38 129/75  Pulse: 67 67 64   Resp: (!) 23 (!) 24 (!) 24 20  Temp: 97.8 F (36.6 C)     TempSrc: Oral     SpO2: 97%  99%   Weight:      Height:       General exam: Alert, awake, oriented x 3; demonstrating mild difficulty speaking in full sentences, high flow nasal cannula supplementation in place.  Reporting no chest pain.  Currently afebrile. Respiratory system: Decreased breath sounds at the bases, positive expiratory wheezing; positive rhonchi bilaterally, positive tachypnea and not use of accessory muscles. Cardiovascular system:RRR. No rubs or gallops. Gastrointestinal system: Abdomen is nondistended, soft and nontender. No organomegaly or masses felt. Normal bowel sounds heard. Central nervous system: Alert and oriented. No focal neurological deficits. Extremities: No cyanosis or clubbing. Skin: No petechiae. Psychiatry: Judgement and insight appear normal. Mood & affect appropriate.   Data Reviewed: Respiratory panel negative for COVID and influenza -D-dimer 8.09 -CT angiogram: Negative for pulmonary embolism. Troponin 310>>296 Comprehensive metabolic panel: Sodium 433, potassium 6.3 chloride 98, bicarb 20,  creatinine 1.12, BUN 34, AST 65, ALT 45 and GFR 48. CBC: WBCs 4.4, hemoglobin 9.7 platelet count 73 K.  Assessment and Plan: * Acute on chronic respiratory failure with hypoxia (HCC) Patient chronically using 2 L nasal cannula supplementation at bedtime. -Presented with increased shortness of breath, short winded sensation with activity, positive orthopnea and difficulty speaking in full sentences. -Found with acute on chronic heart failure, vascular congestion on x-ray, concerns for superimposed pneumonia and COPD exacerbation. -Requiring 15 L high flow nasal, supplementation to maintain saturation. -Patient will be treated with steroids, Pulmicort, Brovana, bronchodilators, mucolytic's and the use of flutter  valve. -IV Lasix, strict I's and O's, low-sodium diet and daily weight will be also provided. -Wean off oxygen supplementation as tolerated and follow clinical response.  MDS (myelodysplastic syndrome) ([place]) -Continue outpatient follow-up with oncology service -Follow CBC to assess blood count trend. -No overt bleeding.  Hypothyroidism -Update TSH -Continue Synthroid.  Hyperkalemia -Lokelma, sodium bicarbonate and calcium gluconate x 1 given -Patient will receive albuterol and Lasix -Continue telemetry monitoring will follow electrolytes trend.  Pancytopenia ([place]) -In summary bleeding appreciated -Continue to follow CBCs -Avoid heparin. -SCDs for DVT prophylaxis.  Chronic diastolic CHF (congestive heart failure) (HCC) -With acute on chronic exacerbation -Follow daily weights, strict I's and O's, low-sodium diet and IV diuresis. -Will check Reds clip measurement. -Follow clinical response. -Obtain 2D echo.  HTN (hypertension) -Stable overall -Resume adjusted dose of metoprolol and Lasix -Follow-up vital signs.  Abdominal aortic aneurysm -Appears to be larger than previous assessment, now reaching 4.8 cm -Outpatient follow-up with vascular surgery recommended -Repeat CT abdomen/MRI in 49-month to further assess stability.  Acute exacerbation of chronic obstructive airways disease (HCC) -Slightly trigger by early pneumonia versus bronchiectasis -Provide treatment with bronchodilators, nebulizer management, mucolytic's, antitussive medications and IV steroids.   -patient started also on antibiotics  Chronic renal failure (CRF), stage 3b ([place]) -Appears to be a stable and at baseline currently -Closely follow electrolytes trend and renal function trend in the setting of acute diuresis. -Strict I's and O's has been ordered.  Elevated troponin: -Patient denies chest pain -EKG without ischemic changes -Most likely demand ischemia in the setting of CHF and COPD  exacerbation -Continue the use of asa, statin and beta-blocker -Telemetry monitoring requested. -Follow-up 2D echo.    Advance Care Planning:   Code Status: Full Code   Consults: none   Family Communication: no family at bedside  Severity of Illness: The appropriate patient status for this patient is INPATIENT. Inpatient status is judged to be reasonable and necessary in order to provide the required intensity of service to ensure the patient's safety. The patient's presenting symptoms, physical exam findings, and initial radiographic and laboratory data in the context of their chronic comorbidities is felt to place them at high risk for further clinical deterioration. Furthermore, it is not anticipated that the patient will be medically stable for discharge from the hospital within 2 midnights of admission.   * I certify that at the point of admission it is my clinical judgment that the patient will require inpatient hospital care spanning beyond 2 midnights from the point of admission due to high intensity of service, high risk for further deterioration and high frequency of surveillance required.*  Author: Barton Dubois, MD 11/03/2022 6:22 PM  For on call review [email or web address].

## 2022-11-03 NOTE — Assessment & Plan Note (Signed)
-  Update TSH -Continue Synthroid.

## 2022-11-03 NOTE — ED Notes (Signed)
EDP at bedside during triage.

## 2022-11-03 NOTE — Assessment & Plan Note (Signed)
-  Continue outpatient follow-up with oncology service -Follow CBC to assess blood count trend. -No overt bleeding.

## 2022-11-03 NOTE — Assessment & Plan Note (Signed)
-  Stable overall -Resume adjusted dose of metoprolol and Lasix -Follow-up vital signs.

## 2022-11-03 NOTE — Assessment & Plan Note (Addendum)
-  Lokelma, sodium bicarbonate and calcium gluconate x 1 given -Patient will receive albuterol and Lasix -Continue telemetry monitoring will follow electrolytes trend.

## 2022-11-03 NOTE — ED Provider Notes (Signed)
Did verify that the patient is full code with her grandson Dr Hilton Sinclair who is a psychiatry resident and the patient's designated power of attorney.  He would like to be updated on any pertinent changes and made aware of any decisions that need to be made on his grandmother's behalf.   Fransico Meadow, MD 11/03/22 [phone]

## 2022-11-03 NOTE — ED Provider Notes (Signed)
[facility] EMERGENCY DEPARTMENT Provider Note   CSN: [id number] Arrival date & time: 11/03/22  2863     History  Chief Complaint  Patient presents with   Shortness of Breath    Lisa [name] is a 89 y.o. female.  89 year old female with a history of myelodysplastic syndrome and pancytopenia, COPD on home O2, atrial fibrillation on metoprolol (not on anticoagulation because of thrombocytopenia), breast cancer, and lung cancer who presents to the emergency department with shortness of breath.  Patient reports that this morning she became short of breath and also had a productive cough.  Denies any fevers.  Over the past 3 to 4 days has also been having approximately 4 loose stools per day.  Reports that some of these have been very dark and black.  Has been taking Pepto-Bismol but says that the black stool started before then.  Does not report any vomiting.  Is not on iron supplementation.  EMS found her satting 65% on room air and was given a nebulizer in route and placed on 6 L/min nasal cannula.  She is unsure of her home oxygen requirement.  Denies any chest pain or abdominal pain. Also has urinary urgency but denies frequency.        Home Medications Prior to Admission medications   Medication Sig Start Date End Date Taking? Authorizing Provider  acetaminophen (TYLENOL) 325 MG tablet Take 2 tablets (650 mg total) by mouth every 6 (six) hours. 11/11/20  Yes Angiulli, Lavon Paganini, PA-C  albuterol (VENTOLIN HFA) 108 (90 Base) MCG/ACT inhaler Inhale 1-2 puffs into the lungs every 4 (four) hours as needed for wheezing or shortness of breath. 05/25/22  Yes [provider]  amLODipine (NORVASC) 10 MG tablet Take 10 mg by mouth daily. 09/07/22  Yes [provider]  aspirin EC 81 MG EC tablet Take 1 tablet (81 mg total) by mouth daily. Swallow whole. 02/12/22  Yes Barton Dubois, MD  gabapentin (NEURONTIN) 100 MG capsule Take 1 capsule (100 mg total) by mouth every 12 (twelve)  hours as needed (neuropathy). 02/11/22 02/11/23 Yes Barton Dubois, MD  levothyroxine (SYNTHROID) 100 MCG tablet Take 1 tablet (100 mcg total) by mouth daily before breakfast. 08/28/21  Yes Johnson, Clanford L, MD  magnesium oxide (MAG-OX) 400 MG tablet Take 400 mg by mouth 2 (two) times daily.   Yes [provider]  metoprolol succinate (TOPROL XL) 25 MG 24 hr tablet Take 1 tablet (25 mg total) by mouth daily. 06/03/22  Yes Strader, [place] M, PA-C  mirtazapine (REMERON) 7.5 MG tablet Take 1 tablet (7.5 mg total) by mouth at bedtime. 05/28/21  Yes Heath Lark, MD  Multiple Vitamin (MULTI-VITAMIN) tablet Take 1 tablet by mouth daily.   Yes [provider]  OXYGEN Inhale 2 L into the lungs at bedtime.   Yes [provider]  sodium chloride 1 g tablet Take 1 tablet (1 g total) by mouth daily. 05/07/22  Yes Derek Jack, MD  umeclidinium bromide (INCRUSE ELLIPTA) 62.5 MCG/INH AEPB Inhale 1 puff into the lungs daily. 12/31/20  Yes [provider]  senna-docusate (SENOKOT-S) 8.6-50 MG tablet Take 1 tablet by mouth at bedtime. Patient not taking: Reported on 11/03/2022 11/11/20   Cathlyn Parsons, PA-C  Vitamin D, Ergocalciferol, (DRISDOL) 1.25 MG (50000 UNIT) CAPS capsule Take 50,000 Units by mouth once a week. 01/10/21   [provider]      Allergies    Meloxicam and Micardis hct [telmisartan-hctz]    Review of Systems  Review of Systems  Physical Exam Updated Vital Signs BP 129/75   Pulse 64   Temp 97.8 F (36.6 C) (Oral)   Resp 20   Ht 5\' 7"  (1.702 m)   Wt 63.5 kg   SpO2 99%   BMI 21.93 kg/m  Physical Exam Vitals and nursing note reviewed.  Constitutional:      General: She is not in acute distress.    Appearance: She is well-developed.     Comments: Breathing comfortably on 6 L nasal cannula.  HENT:     Head: Normocephalic and atraumatic.     Right Ear: External ear normal.     Left Ear: External ear normal.     Nose: Nose normal.   Eyes:     Extraocular Movements: Extraocular movements intact.     Conjunctiva/sclera: Conjunctivae normal.     Pupils: Pupils are equal, round, and reactive to light.  Cardiovascular:     Rate and Rhythm: Normal rate and regular rhythm.     Heart sounds: No murmur heard. Pulmonary:     Effort: Pulmonary effort is normal. No respiratory distress.     Breath sounds: Wheezing (Faint expiratory wheezes diffusely) present.  Abdominal:     General: Abdomen is flat. There is no distension.     Palpations: Abdomen is soft. There is no mass.     Tenderness: There is no abdominal tenderness. There is no guarding.  Musculoskeletal:     Cervical back: Normal range of motion and neck supple.     Right lower leg: No edema.     Left lower leg: No edema.  Skin:    General: Skin is warm and dry.  Neurological:     Mental Status: She is alert and oriented to person, place, and time. Mental status is at baseline.  Psychiatric:        Mood and Affect: Mood normal.     ED Results / Procedures / Treatments   Labs (all labs ordered are listed, but only abnormal results are displayed) Labs Reviewed  COMPREHENSIVE METABOLIC PANEL - Abnormal; Notable for the following components:      Result Value   Sodium 124 (*)    Potassium 6.3 (*)    CO2 20 (*)    Glucose, Bld 113 (*)    BUN 34 (*)    Creatinine, Ser 1.12 (*)    Calcium 7.9 (*)    Total Protein 9.2 (*)    AST 65 (*)    ALT 45 (*)    GFR, Estimated 48 (*)    All other components within normal limits  BRAIN NATRIURETIC PEPTIDE - Abnormal; Notable for the following components:   B Natriuretic Peptide 743.0 (*)    All other components within normal limits  CBC WITH DIFFERENTIAL/PLATELET - Abnormal; Notable for the following components:   RBC 3.41 (*)    Hemoglobin 9.7 (*)    HCT 35.6 (*)    MCV 104.4 (*)    MCHC 27.2 (*)    RDW 19.2 (*)    Platelets 73 (*)    nRBC 29.3 (*)    Neutro Abs 0.2 (*)    Monocytes Absolute 2.4 (*)     nRBC 24 (*)    All other components within normal limits  RETICULOCYTES - Abnormal; Notable for the following components:   Retic Ct Pct 4.1 (*)    RBC. 3.40 (*)    Immature Retic Fract 32.3 (*)    All other components within normal  limits  URINALYSIS, ROUTINE W REFLEX MICROSCOPIC - Abnormal; Notable for the following components:   Color, Urine STRAW (*)    All other components within normal limits  D-DIMER, QUANTITATIVE - Abnormal; Notable for the following components:   D-Dimer, Quant 8.09 (*)    All other components within normal limits  BASIC METABOLIC PANEL - Abnormal; Notable for the following components:   Sodium 126 (*)    Potassium 5.2 (*)    CO2 20 (*)    Glucose, Bld 109 (*)    BUN 32 (*)    Creatinine, Ser 1.04 (*)    Calcium 8.1 (*)    GFR, Estimated 52 (*)    All other components within normal limits  LACTATE DEHYDROGENASE - Abnormal; Notable for the following components:   LDH 314 (*)    All other components within normal limits  URIC ACID - Abnormal; Notable for the following components:   Uric Acid, Serum 8.1 (*)    All other components within normal limits  TROPONIN I (HIGH SENSITIVITY) - Abnormal; Notable for the following components:   Troponin I (High Sensitivity) 310 (*)    All other components within normal limits  TROPONIN I (HIGH SENSITIVITY) - Abnormal; Notable for the following components:   Troponin I (High Sensitivity) 296 (*)    All other components within normal limits  RESP PANEL BY RT-PCR (RSV, FLU A&B, COVID)  RVPGX2  C DIFFICILE QUICK SCREEN W PCR REFLEX    GASTROINTESTINAL PANEL BY PCR, STOOL (REPLACES STOOL CULTURE)  MRSA NEXT GEN BY PCR, NASAL  PHOSPHORUS  BASIC METABOLIC PANEL  MAGNESIUM  TSH  POC OCCULT BLOOD, ED    EKG EKG Interpretation  Date/Time:  Tuesday November 03 2022 07:36:30 EST Ventricular Rate:  75 PR Interval:  214 QRS Duration: 107 QT Interval:  376 QTC Calculation: 420 R Axis:   9 Text Interpretation: Sinus  rhythm Borderline prolonged PR interval Abnormal R-wave progression, late transition Borderline repolarization abnormality Confirmed by Margaretmary Eddy [phone]) on 11/03/2022 9:06:01 AM  Radiology CT Angio Chest PE W and/or Wo Contrast  Result Date: 11/03/2022 CLINICAL DATA:  Shortness of breath with nonproductive cough, trouble urinating and diarrhea for 3 days. On home oxygen. EXAM: CT ANGIOGRAPHY CHEST CT ABDOMEN AND PELVIS WITH CONTRAST TECHNIQUE: Multidetector CT imaging of the chest was performed using the standard protocol during bolus administration of intravenous contrast. Multiplanar CT image reconstructions and MIPs were obtained to evaluate the vascular anatomy. Multidetector CT imaging of the abdomen and pelvis was performed using the standard protocol during bolus administration of intravenous contrast. RADIATION DOSE REDUCTION: This exam was performed according to the departmental dose-optimization program which includes automated exposure control, adjustment of the mA and/or kV according to patient size and/or use of iterative reconstruction technique. CONTRAST:  78mL OMNIPAQUE IOHEXOL 350 MG/ML SOLN COMPARISON:  CTs of the chest, abdomen and pelvis 10/24/2020. FINDINGS: CTA CHEST FINDINGS Cardiovascular: The pulmonary arteries are well opacified with contrast to the level of the subsegmental branches. There is no evidence of acute pulmonary embolism. No acute systemic arterial abnormalities are identified. There is diffuse atherosclerosis of the aorta, great vessels and coronary arteries. There are calcifications of the aortic valve. The heart is moderately enlarged. No significant pericardial fluid. Mediastinum/Nodes: There are no enlarged mediastinal, hilar or axillary lymph nodes. The thyroid gland, trachea and esophagus demonstrate no significant findings. Lungs/Pleura: Small right-greater-than-left pleural effusions with associated dependent atelectasis at both lung bases, worse on the right.  Underlying moderate centrilobular  and paraseptal emphysema with superimposed patchy ground-glass opacities suspicious for edema. There are stable postsurgical changes from previous left upper lobe wedge resection. No suspicious pulmonary nodules. Musculoskeletal/Chest wall: No chest wall mass or suspicious osseous findings. Multilevel thoracic spondylosis. CT ABDOMEN AND PELVIS FINDINGS Hepatobiliary: The liver is normal in density without suspicious focal abnormality. There is generalized periportal edema with mild gallbladder wall thickening and pericholecystic fluid. No calcified gallstones or significant biliary dilatation. Pancreas: Unremarkable. No pancreatic ductal dilatation or surrounding inflammatory changes. Spleen: Normal in size without focal abnormality. Adrenals/Urinary Tract: Both adrenal glands appear normal. No evidence of urinary tract calculus, suspicious renal lesion or hydronephrosis. The bladder appears normal for its degree of distention. Stomach/Bowel: Enteric contrast was administered and has passed into the distal colon. The stomach appears unremarkable for its degree of distension. No evidence of bowel wall thickening, distention or surrounding inflammatory change. The appendix appears normal. Vascular/Lymphatic: There are no enlarged abdominal or pelvic lymph nodes. Diffuse aortic and branch vessel atherosclerosis with fusiform dilatation of the infrarenal abdominal aorta. This now has a maximal diameter of 4.8 cm (previously 4.4 cm when measured in a similar fashion). No evidence of aneurysm leak or rupture. No evidence of large vessel occlusion. Reproductive: The uterus and ovaries appear unremarkable. No adnexal mass. Other: Moderate free pelvic fluid of undetermined etiology. Trace generalized ascites. No pneumoperitoneum or focal extraluminal fluid collection. Musculoskeletal: No acute or significant osseous findings. Mild spondylosis. Review of the MIP images confirms the above  findings. IMPRESSION: 1. No evidence of acute pulmonary embolism or other acute vascular findings in the chest. 2. Cardiomegaly with bilateral pleural effusions, patchy ground-glass opacities and periportal hepatic edema suspicious for congestive heart failure. 3. Moderate free pelvic fluid of undetermined etiology, potentially related to the patient's fluid overload. 4. Fusiform infrarenal abdominal aortic aneurysm, now measuring up to 4.8 cm in maximal diameter, mildly enlarged from previous study 2 years ago. Recommend follow-up CT/MR every 6 months and vascular consultation. This recommendation follows ACR consensus guidelines: White Paper of the ACR Incidental Findings Committee II on Vascular Findings. J Am Coll Radiol 2013; 10:789-794. 5. No evidence of bowel obstruction or perforation. The appendix appears normal. 6. Aortic Atherosclerosis (ICD10-I70.0) and Emphysema (ICD10-J43.9). Electronically Signed   By: Richardean Sale M.D.   On: 11/03/2022 12:37   CT ABDOMEN PELVIS W CONTRAST  Result Date: 11/03/2022 CLINICAL DATA:  Shortness of breath with nonproductive cough, trouble urinating and diarrhea for 3 days. On home oxygen. EXAM: CT ANGIOGRAPHY CHEST CT ABDOMEN AND PELVIS WITH CONTRAST TECHNIQUE: Multidetector CT imaging of the chest was performed using the standard protocol during bolus administration of intravenous contrast. Multiplanar CT image reconstructions and MIPs were obtained to evaluate the vascular anatomy. Multidetector CT imaging of the abdomen and pelvis was performed using the standard protocol during bolus administration of intravenous contrast. RADIATION DOSE REDUCTION: This exam was performed according to the departmental dose-optimization program which includes automated exposure control, adjustment of the mA and/or kV according to patient size and/or use of iterative reconstruction technique. CONTRAST:  29mL OMNIPAQUE IOHEXOL 350 MG/ML SOLN COMPARISON:  CTs of the chest, abdomen and  pelvis 10/24/2020. FINDINGS: CTA CHEST FINDINGS Cardiovascular: The pulmonary arteries are well opacified with contrast to the level of the subsegmental branches. There is no evidence of acute pulmonary embolism. No acute systemic arterial abnormalities are identified. There is diffuse atherosclerosis of the aorta, great vessels and coronary arteries. There are calcifications of the aortic valve. The heart is moderately enlarged.  No significant pericardial fluid. Mediastinum/Nodes: There are no enlarged mediastinal, hilar or axillary lymph nodes. The thyroid gland, trachea and esophagus demonstrate no significant findings. Lungs/Pleura: Small right-greater-than-left pleural effusions with associated dependent atelectasis at both lung bases, worse on the right. Underlying moderate centrilobular and paraseptal emphysema with superimposed patchy ground-glass opacities suspicious for edema. There are stable postsurgical changes from previous left upper lobe wedge resection. No suspicious pulmonary nodules. Musculoskeletal/Chest wall: No chest wall mass or suspicious osseous findings. Multilevel thoracic spondylosis. CT ABDOMEN AND PELVIS FINDINGS Hepatobiliary: The liver is normal in density without suspicious focal abnormality. There is generalized periportal edema with mild gallbladder wall thickening and pericholecystic fluid. No calcified gallstones or significant biliary dilatation. Pancreas: Unremarkable. No pancreatic ductal dilatation or surrounding inflammatory changes. Spleen: Normal in size without focal abnormality. Adrenals/Urinary Tract: Both adrenal glands appear normal. No evidence of urinary tract calculus, suspicious renal lesion or hydronephrosis. The bladder appears normal for its degree of distention. Stomach/Bowel: Enteric contrast was administered and has passed into the distal colon. The stomach appears unremarkable for its degree of distension. No evidence of bowel wall thickening, distention or  surrounding inflammatory change. The appendix appears normal. Vascular/Lymphatic: There are no enlarged abdominal or pelvic lymph nodes. Diffuse aortic and branch vessel atherosclerosis with fusiform dilatation of the infrarenal abdominal aorta. This now has a maximal diameter of 4.8 cm (previously 4.4 cm when measured in a similar fashion). No evidence of aneurysm leak or rupture. No evidence of large vessel occlusion. Reproductive: The uterus and ovaries appear unremarkable. No adnexal mass. Other: Moderate free pelvic fluid of undetermined etiology. Trace generalized ascites. No pneumoperitoneum or focal extraluminal fluid collection. Musculoskeletal: No acute or significant osseous findings. Mild spondylosis. Review of the MIP images confirms the above findings. IMPRESSION: 1. No evidence of acute pulmonary embolism or other acute vascular findings in the chest. 2. Cardiomegaly with bilateral pleural effusions, patchy ground-glass opacities and periportal hepatic edema suspicious for congestive heart failure. 3. Moderate free pelvic fluid of undetermined etiology, potentially related to the patient's fluid overload. 4. Fusiform infrarenal abdominal aortic aneurysm, now measuring up to 4.8 cm in maximal diameter, mildly enlarged from previous study 2 years ago. Recommend follow-up CT/MR every 6 months and vascular consultation. This recommendation follows ACR consensus guidelines: White Paper of the ACR Incidental Findings Committee II on Vascular Findings. J Am Coll Radiol 2013; 10:789-794. 5. No evidence of bowel obstruction or perforation. The appendix appears normal. 6. Aortic Atherosclerosis (ICD10-I70.0) and Emphysema (ICD10-J43.9). Electronically Signed   By: Richardean Sale M.D.   On: 11/03/2022 12:37   DG Chest Port 1 View  Result Date: 11/03/2022 CLINICAL DATA:  Shortness of breath. Decreased O2 sats. Coughing congestion. EXAM: PORTABLE CHEST 1 VIEW COMPARISON:  08/28/2021 FINDINGS: 0746 hours. The  cardio pericardial silhouette is enlarged. Interstitial markings are diffusely coarsened with chronic features. Bibasilar patchy airspace disease suggests pneumonia. Small left pleural effusion evident. The visualized bony structures of the thorax are unremarkable. Telemetry leads overlie the chest. IMPRESSION: 1. Bibasilar patchy airspace disease suggests pneumonia. 2. Small left pleural effusion. Electronically Signed   By: Misty Stanley M.D.   On: 11/03/2022 07:55    Procedures Procedures   Medications Ordered in ED Medications  levothyroxine (SYNTHROID) tablet 100 mcg (has no administration in time range)  metoprolol succinate (TOPROL-XL) 24 hr tablet 25 mg (25 mg Oral Given 11/03/22 1737)  gabapentin (NEURONTIN) capsule 100 mg (has no administration in time range)  aspirin EC tablet 81 mg (81 mg  Oral Given 11/03/22 1737)  multivitamin with minerals tablet 1 tablet (1 tablet Oral Given 11/03/22 1737)  mirtazapine (REMERON) tablet 7.5 mg (has no administration in time range)  pantoprazole (PROTONIX) EC tablet 40 mg (40 mg Oral Given 11/03/22 1737)  methylPREDNISolone sodium succinate (SOLU-MEDROL) 40 mg/mL injection 40 mg (40 mg Intravenous Given 11/03/22 1738)    Followed by  predniSONE (DELTASONE) tablet 40 mg (has no administration in time range)  ipratropium-albuterol (DUONEB) 0.5-2.5 (3) MG/3ML nebulizer solution 3 mL (has no administration in time range)  albuterol (PROVENTIL) (2.5 MG/3ML) 0.083% nebulizer solution 2.5 mg (has no administration in time range)  budesonide (PULMICORT) nebulizer solution 0.5 mg (has no administration in time range)  arformoterol (BROVANA) nebulizer solution 15 mcg (has no administration in time range)  furosemide (LASIX) injection 40 mg (40 mg Intravenous Given 11/03/22 1738)  sodium chloride flush (NS) 0.9 % injection 3 mL (has no administration in time range)  sodium chloride flush (NS) 0.9 % injection 3 mL (has no administration in time range)  0.9 %  sodium  chloride infusion (has no administration in time range)  acetaminophen (TYLENOL) tablet 650 mg (has no administration in time range)    Or  acetaminophen (TYLENOL) suppository 650 mg (has no administration in time range)  ondansetron (ZOFRAN) tablet 4 mg (has no administration in time range)    Or  ondansetron (ZOFRAN) injection 4 mg (has no administration in time range)  ceFEPIme (MAXIPIME) 2 g in sodium chloride 0.9 % 100 mL IVPB ( Intravenous Infusion Verify 11/03/22 1817)  sodium bicarbonate injection 50 mEq (has no administration in time range)  ipratropium-albuterol (DUONEB) 0.5-2.5 (3) MG/3ML nebulizer solution 3 mL (3 mLs Nebulization Given 11/03/22 0835)  cefTRIAXone (ROCEPHIN) 2 g in sodium chloride 0.9 % 100 mL IVPB (0 g Intravenous Stopped 11/03/22 0939)  azithromycin (ZITHROMAX) 500 mg in sodium chloride 0.9 % 250 mL IVPB (0 mg Intravenous Stopped 11/03/22 1129)  sodium zirconium cyclosilicate (LOKELMA) packet 10 g (10 g Oral Given 11/03/22 0938)  furosemide (LASIX) injection 60 mg (60 mg Intravenous Given 11/03/22 0940)  albuterol (PROVENTIL) (2.5 MG/3ML) 0.083% nebulizer solution 10 mg (10 mg Nebulization Given 11/03/22 0937)  insulin aspart (novoLOG) injection 5 Units (5 Units Intravenous Given 11/03/22 0957)    And  dextrose 50 % solution 50 mL (50 mLs Intravenous Given 11/03/22 0943)  calcium gluconate 1 g/ 50 mL sodium chloride IVPB (0 mg Intravenous Stopped 11/03/22 1129)  iohexol (OMNIPAQUE) 350 MG/ML injection 75 mL (75 mLs Intravenous Contrast Given 11/03/22 1155)    ED Course/ Medical Decision Making/ A&P Clinical Course as of 11/03/22 1832  Tue Nov 03, 2022  0929 Hemoglobin(!): 9.7 At baseline [RP]  0929 Platelets(!): 73 Improved from baseline of 50 [RP]  1406 Dr Dyann Kief [RP]    Clinical Course User Index [RP] Fransico Meadow, MD                           Medical Decision Making Amount and/or Complexity of Data Reviewed Labs: ordered. Decision-making details documented in ED  Course. Radiology: ordered.  Risk OTC drugs. Prescription drug management. Decision regarding hospitalization.   Lisa [name] is a 89 y.o. female with comorbidities that complicate the patient evaluation including myelodysplastic syndrome and pancytopenia, COPD on home O2, atrial fibrillation on metoprolol (not on anticoagulation because of thrombocytopenia), breast cancer, and lung cancer who presents to the emergency department with shortness of breath and diarrhea  Initial Ddx:  COVID/flu, pneumonia, PE, effusion, gastroenteritis, GI bleed, UTI, COPD exacerbation, MI  MDM:  Feel the patient likely has a pulmonary infection that has caused a mild COPD exacerbation leading to her wheezing on exam.  She is at risk for PE as well as effusions with her cancer history as well.  Her diarrhea could be due to a viral infection such as COVID and flu or potentially gastroenteritis.  With her pancytopenia is at increased risk for GI bleed.  With her urgency will check urine for UTI.  At her age also at risk for atypical presentations for MI so will obtain troponins and EKG.  Plan:  Labs D-dimer Troponin Chest x-ray DuoNebs Hemoccult Urinalysis COVID and flu  ED Summary/Re-evaluation:  Patient reevaluated and had increasing oxygen requirement.  She was placed on high flow nasal cannula at 15 L/min.  Did have a chest x-ray that showed a possible pneumonia and was started on ceftriaxone and azithromycin.  Labs returned which showed that the patient was neutropenic so we withheld rectal exam to test for blood.  He was also thrombocytopenic but improved from her baseline.  Was found to have an elevated potassium and was given Lasix, albuterol, and Lokelma.  Labs were sent for tumor lysis syndrome but her phosphorus was normal despite an elevated LDH and uric acid.  After temporization her potassium was 5.2.  Patient had a CTA of the chest along with abdomen and pelvis after her D-dimer was elevated  which did not show evidence of PE but did show diffuse groundglass infiltrates concerning for pulmonary edema.  Given the patient's oxygen requirement she was admitted to the stepdown unit for further management.  Stool studies were also ordered along with fecal occult if she is able to give a stool sample.  Of note, troponin was found to be elevated and I suspect this is due to her lung disease as it was downtrending.  Withheld aspirin due to her thrombocytopenia and bleeding risk.  Of note, did verify that the patient is full code with her grandson Dr Hilton Sinclair who is a psychiatry resident and the patient's designated power of attorney.  He would like to be updated on any pertinent changes and made aware of any decisions that need to be made on his grandmother's behalf.  This patient presents to the ED for concern of complaints listed in HPI, this involves an extensive number of treatment options, and is a complaint that carries with it a high risk of complications and morbidity. Disposition including potential need for admission considered.   Dispo: Admit to Step Down  Additional history obtained from daughter Records reviewed Outpatient Clinic Notes The following labs were independently interpreted: Chemistry and Serial Troponins and show  hyperkalemia with demand ischemia I independently reviewed the following imaging with scope of interpretation limited to determining acute life threatening conditions related to emergency care: Chest x-ray and agree with the radiologist interpretation with the following exceptions: None I personally reviewed and interpreted cardiac monitoring: normal sinus rhythm  I personally reviewed and interpreted the pt's EKG: see above for interpretation  I have reviewed the patients home medications and made adjustments as needed Consults: Hospitalist  Final Clinical Impression(s) / ED Diagnoses Final diagnoses:  COPD exacerbation ([place])  Acute on chronic  congestive heart failure, unspecified heart failure type ([place])  Pneumonia due to infectious organism, unspecified laterality, unspecified part of lung  Hypoxia  Diarrhea, unspecified type    Rx / DC Orders ED Discharge Orders  None      CRITICAL CARE Performed by: Fransico Meadow   Total critical care time: 45 minutes  Critical care time was exclusive of separately billable procedures and treating other patients.  Critical care was necessary to treat or prevent imminent or life-threatening deterioration.  Critical care was time spent personally by me on the following activities: development of treatment plan with patient and/or surrogate as well as nursing, discussions with consultants, evaluation of patient's response to treatment, examination of patient, obtaining history from patient or surrogate, ordering and performing treatments and interventions, ordering and review of laboratory studies, ordering and review of radiographic studies, pulse oximetry and re-evaluation of patient's condition.    Fransico Meadow, MD 11/03/22 [phone]

## 2022-11-03 NOTE — Assessment & Plan Note (Addendum)
-  With acute on chronic exacerbation -Follow daily weights, strict I's and O's, low-sodium diet and IV diuresis. -Will check Reds clip measurement. -Follow clinical response. -Obtain 2D echo.

## 2022-11-03 NOTE — Assessment & Plan Note (Signed)
-  In summary bleeding appreciated -Continue to follow CBCs -Avoid heparin. -SCDs for DVT prophylaxis.

## 2022-11-03 NOTE — Assessment & Plan Note (Signed)
Patient chronically using 2 L nasal cannula supplementation at bedtime. -Presented with increased shortness of breath, short winded sensation with activity, positive orthopnea and difficulty speaking in full sentences. -Found with acute on chronic heart failure, vascular congestion on x-ray, concerns for superimposed pneumonia and COPD exacerbation. -Requiring 15 L high flow nasal, supplementation to maintain saturation. -Patient will be treated with steroids, Pulmicort, Brovana, bronchodilators, mucolytic's and the use of flutter valve. -IV Lasix, strict I's and O's, low-sodium diet and daily weight will be also provided. -Wean off oxygen supplementation as tolerated and follow clinical response.

## 2022-11-04 ENCOUNTER — Inpatient Hospital Stay (HOSPITAL_COMMUNITY): Payer: Medicare Other

## 2022-11-04 ENCOUNTER — Encounter (HOSPITAL_COMMUNITY): Payer: Self-pay | Admitting: Hematology and Oncology

## 2022-11-04 ENCOUNTER — Other Ambulatory Visit (HOSPITAL_COMMUNITY): Payer: Self-pay | Admitting: *Deleted

## 2022-11-04 DIAGNOSIS — D61818 Other pancytopenia: Secondary | ICD-10-CM

## 2022-11-04 DIAGNOSIS — I5031 Acute diastolic (congestive) heart failure: Secondary | ICD-10-CM | POA: Diagnosis not present

## 2022-11-04 DIAGNOSIS — I5033 Acute on chronic diastolic (congestive) heart failure: Secondary | ICD-10-CM

## 2022-11-04 DIAGNOSIS — N1832 Chronic kidney disease, stage 3b: Secondary | ICD-10-CM | POA: Diagnosis not present

## 2022-11-04 DIAGNOSIS — D469 Myelodysplastic syndrome, unspecified: Secondary | ICD-10-CM | POA: Diagnosis not present

## 2022-11-04 DIAGNOSIS — J441 Chronic obstructive pulmonary disease with (acute) exacerbation: Secondary | ICD-10-CM | POA: Diagnosis not present

## 2022-11-04 DIAGNOSIS — J9621 Acute and chronic respiratory failure with hypoxia: Secondary | ICD-10-CM | POA: Diagnosis not present

## 2022-11-04 LAB — ECHOCARDIOGRAM COMPLETE
AR max vel: 0.8 cm2
AV Area VTI: 0.79 cm2
AV Area mean vel: 0.78 cm2
AV Mean grad: 22 mmHg
AV Peak grad: 36.6 mmHg
Ao pk vel: 3.02 m/s
Area-P 1/2: 4.63 cm2
Height: 61 in
MV M vel: 4.13 m/s
MV Peak grad: 68.2 mmHg
MV VTI: 2.14 cm2
S' Lateral: 2.2 cm
Weight: 2349.22 oz

## 2022-11-04 LAB — BASIC METABOLIC PANEL
Anion gap: 6 (ref 5–15)
Anion gap: 8 (ref 5–15)
BUN: 30 mg/dL — ABNORMAL HIGH (ref 8–23)
BUN: 31 mg/dL — ABNORMAL HIGH (ref 8–23)
CO2: 23 mmol/L (ref 22–32)
CO2: 20 mmol/L — ABNORMAL LOW (ref 22–32)
Calcium: 8.1 mg/dL — ABNORMAL LOW (ref 8.9–10.3)
Calcium: 8.2 mg/dL — ABNORMAL LOW (ref 8.9–10.3)
Chloride: 102 mmol/L (ref 98–111)
Chloride: 98 mmol/L (ref 98–111)
Creatinine, Ser: 1 mg/dL (ref 0.44–1.00)
Creatinine, Ser: 1.12 mg/dL — ABNORMAL HIGH (ref 0.44–1.00)
GFR, Estimated: 55 mL/min — ABNORMAL LOW (ref >60)
GFR, Estimated: 48 mL/min — ABNORMAL LOW (ref >60)
Glucose, Bld: 122 mg/dL — ABNORMAL HIGH (ref 70–99)
Glucose, Bld: 155 mg/dL — ABNORMAL HIGH (ref 70–99)
Potassium: 6.1 mmol/L — ABNORMAL HIGH (ref 3.5–5.1)
Potassium: 5.3 mmol/L — ABNORMAL HIGH (ref 3.5–5.1)
Sodium: 131 mmol/L — ABNORMAL LOW (ref 135–145)
Sodium: 126 mmol/L — ABNORMAL LOW (ref 135–145)

## 2022-11-04 LAB — TROPONIN I (HIGH SENSITIVITY): Troponin I (High Sensitivity): 192 ng/L (ref <18)

## 2022-11-04 LAB — MRSA NEXT GEN BY PCR, NASAL: MRSA by PCR Next Gen: NOT DETECTED

## 2022-11-04 MED ORDER — SODIUM ZIRCONIUM CYCLOSILICATE 5 G PO PACK
5.0000 g | PACK | Freq: Two times a day (BID) | ORAL | Status: DC
  Administered 2022-11-04 – 2022-11-05 (×3): 5 g via ORAL
  Filled 2022-11-04 (×3): qty 1

## 2022-11-04 MED ORDER — MELATONIN 3 MG PO TABS
6.0000 mg | ORAL_TABLET | Freq: Every day | ORAL | Status: DC
  Administered 2022-11-04 – 2022-11-06 (×3): 6 mg via ORAL
  Filled 2022-11-04 (×3): qty 2

## 2022-11-04 MED ORDER — DIPHENHYDRAMINE HCL 25 MG PO CAPS
25.0000 mg | ORAL_CAPSULE | Freq: Once | ORAL | Status: AC
  Administered 2022-11-04: 25 mg via ORAL
  Filled 2022-11-04: qty 1

## 2022-11-04 MED ORDER — IPRATROPIUM-ALBUTEROL 0.5-2.5 (3) MG/3ML IN SOLN
3.0000 mL | Freq: Three times a day (TID) | RESPIRATORY_TRACT | Status: DC
  Administered 2022-11-05 – 2022-11-07 (×8): 3 mL via RESPIRATORY_TRACT
  Filled 2022-11-04 (×8): qty 3

## 2022-11-04 MED ORDER — CALCIUM GLUCONATE-NACL 1-0.675 GM/50ML-% IV SOLN
1.0000 g | Freq: Once | INTRAVENOUS | Status: AC
  Administered 2022-11-04: 1000 mg via INTRAVENOUS
  Filled 2022-11-04: qty 50

## 2022-11-04 NOTE — TOC Initial Note (Signed)
Transition of Care [facility]) - Initial/Assessment Note    Patient Details  Name: Lisa [name] MRN: [id number] Date of Birth: 08-20-1934  Transition of [facility]) CM/SW Contact:    Ihor Gully, LCSW Phone Number: 11/04/2022, 3:21 PM  Clinical Narrative:                 Patient from home with daughter, [name]. Admitted for acute on chronic respiratory failure. TOC consulted for HF home screen. Patient follows [place] [facility] [place]. Does not take daily maintains appointments. Does not take daily weights. Uses cane and a walker for ambulation. Has home oxygen provided by [employer]. Has bathroom chair and bathroom bars.    Expected Discharge Plan: Home/Self Care Barriers to Discharge: Continued Medical Work up   Patient Goals and CMS Choice Patient states their goals for this hospitalization and ongoing recovery are:: return home          Expected Discharge Plan and Services       Living arrangements for the past 2 months: Single Family Home                                      Prior Living Arrangements/Services Living arrangements for the past 2 months: Single Family Home Lives with:: Adult Children Patient language and need for interpreter reviewed:: Yes        Need for Family Participation in Patient Care: Yes (Comment) Care giver support system in place?: Yes (comment) Current home services: DME Criminal Activity/Legal Involvement Pertinent to Current Situation/Hospitalization: No - Comment as needed  Activities of Daily Living Home Assistive Devices/Equipment: None ADL Screening (condition at time of admission) Patient's cognitive ability adequate to safely complete daily activities?: Yes Is the patient deaf or have difficulty hearing?: No Does the patient have difficulty seeing, even when wearing glasses/contacts?: No Does the patient have difficulty concentrating, remembering, or making decisions?: No Patient able to express need for assistance with ADLs?: Yes Does the  patient have difficulty dressing or bathing?: No Independently performs ADLs?: Yes (appropriate for developmental age) Does the patient have difficulty walking or climbing stairs?: Yes Weakness of Legs: Both Weakness of Arms/Hands: None  Permission Sought/Granted Permission sought to share information with : Family Supports    Share Information with NAME: daughter, [name]           Emotional Assessment       Orientation: : Oriented to Self Alcohol / Substance Use: Not Applicable Psych Involvement: No (comment)  Admission diagnosis:  Acute on chronic respiratory failure with hypoxia ([place]) [J96.21] Patient Active Problem List   Diagnosis Date Noted   Acute on chronic respiratory failure with hypoxia ([place]) 11/03/2022   Pancytopenia ([place]) 11/03/2022   Hyperkalemia 11/03/2022   Chronic diastolic CHF (congestive heart failure) ([place]) 02/11/2022   TIA (transient ischemic attack) 02/10/2022   MDS (myelodysplastic syndrome) ([place]) 05/14/2021   Hyponatremia    Labile blood pressure    Debility 10/30/2020   Anemia of chronic disease    New onset atrial fibrillation ([place])    Malnutrition of moderate degree 10/28/2020   Bilateral knee effusions    FTT (failure to thrive) in adult    Decreased appetite    Weakness    Gout 10/21/2020   Hypothyroidism 10/21/2020   HTN (hypertension) 10/21/2020   Weight loss 10/21/2020   Generalized muscle weakness 10/21/2020   Osteoarthritis 10/21/2020   Atrial fibrillation with RVR ([place]) 10/21/2020  Deficiency anemia 03/13/2019   Acute respiratory failure with hypoxia (HCC) 05/31/2017   Hyperglycemia 05/31/2017   Chronic renal failure (CRF), stage 3b (HCC) 05/31/2017   Normocytic anemia 05/31/2017   Acute exacerbation of chronic obstructive airways disease ([place]) 05/31/2017   Abdominal aortic aneurysm 05/31/2017   Urinary incontinence 05/31/2017   Vitamin D deficiency 06/10/2016   Neutropenia ([place]) 06/09/2016   Invasive ductal carcinoma of  left breast ([place]) 06/09/2016   Adenocarcinoma of left lung ([place]) 06/09/2016   Malignant neoplasm of unspecified site of unspecified female breast ([place]) 06/09/2016   Adenomatous polyps 02/08/2013   PCP:  Celene Squibb, MD Pharmacy:   [place], [address] [address] Phone: [phone] Fax: [phone]  [place] (NOT [place] [address] [place] [address] Phone: [phone] Fax: [phone]  [facility] [place], [place] [address] Phone: [phone] Fax: [phone]     Social Determinants of Health (SDOH) Social History: [place]: No Food Insecurity (11/03/2022)  Housing: Low Risk  (11/03/2022)  Transportation Needs: No Transportation Needs (11/03/2022)  Utilities: Not At Risk (11/03/2022)  Depression (PHQ2-9): Low Risk  (12/12/2020)  Physical Activity: Inactive (12/12/2020)  Tobacco Use: Medium Risk (11/03/2022)   SDOH Interventions: Housing Interventions: Intervention Not Indicated   Readmission Risk Interventions    08/28/2021    1:06 PM  Readmission Risk Prevention Plan  Transportation Screening Complete  HRI or [place] Complete  Social Work Consult for [place] Planning/Counseling Complete  Palliative Care Screening Not Applicable  Medication Review Press photographer) Complete

## 2022-11-04 NOTE — Progress Notes (Incomplete)
Patient had an uneventful shift. Ox weaned down to 5L [place]. Patient mobilized at highest level of mobility once this shift and refused twice stating she was just too tired. Patient has been getting up with staff assistance to the the bedside commode. Patient's daughter visited and stated she will bring in patient's POA papers. Patient POA (grandson Rosalita Chessman) called and had several questions and agreed to wait on MD to give him a call back. Patient's bowel sounds are normal active and no diarrhea noted today, provider d/c's enteric precautions. Patient's potassium is now 5.3 which is down from 6.1. Some productive coughing noted.

## 2022-11-04 NOTE — Progress Notes (Signed)
Pt receiving pt care.

## 2022-11-04 NOTE — Progress Notes (Signed)
Progress Note   Patient: Lisa [name] OZH:[id number] DOB: October 31, 1934 DOA: 11/03/2022     1 DOS: the patient was seen and examined on 11/04/2022   Brief hospital course:  Lisa [name] is a 89 y.o. female with medical history significant of history of lung cancer, breast cancer and MDS, pancytopenia, asthma/COPD and diastolic heart failure; who presented to the hospital secondary to shortness of breath, decreased oral intake, loose stools, anorexia and productive coughing spells.  Patient reports symptom has been present for the last 4 days prior to admission.  Some of her loose stool has been dark in color after using Pepto-Bismol.  No hematemesis, no vomiting, no chest pain, no fever, no sick contacts, no focal weakness, no dysuria or any other complaints. Patient reports some orthopnea the night prior to admission and has also noticed unsure when the sensation.   Workup in the ED demonstrated elevated D-dimer with subsequent CT angiogram negative for pulmonary embolism but demonstrating vascular congestion suggesting CHF exacerbation and unable to rule out the presence of midlung infiltrate/pneumonia.  BNP was elevated and on physical examination patient found to be hypoxic requiring up to 15 L high flow nasal cannula supplementation to stabilize saturation.  Patient was wheezing and demonstrating poor air movement.   Steroids, bronchodilators, antibiotics and IV Lasix provided at time of admission.  TRH contacted to place patient in the hospital for further evaluation and management.  Assessment and Plan: 1-acute on chronic respiratory failure with hypoxia -Patient chronically using 2 L nasal cannula supplementation at bedtime. -At time of admission and requiring 15 L high flow nasal cannula supplementation to maintain saturation. -Acute triggering cause attributed to COPD exacerbation, bronchiectasis/pneumonia and the presence of acute on chronic diastolic heart failure. -Continue treatment  with steroids, bronchodilator management, antibiotics and IV diuresis. -Patient also receiving Pulmicort and Brovana along with mucolytic's and the use of flutter valve. -Continue daily weights, strict I's and O's and follow electrolytes/renal function with diuresis.  2-hyperkalemia  -Lokelma will be provided -Continue telemetry monitoring and follow electrolytes trend -Bronchodilator management and the use of diuretics will help with stabilizing electrolytes.    3-Hypothyroidism -Continue Synthroid  4-pancytopenia -Given low platelets count we will avoid heparin products and use SCDs for DVT prophylaxis.   -Continue to follow CBC  5-history of MDS -Continue patient follow-up with oncology service -No overt bleeding appreciated.  6-hypertension -Appears to be stable -Continue current antihypertensive agents.  7-abdominal aortic aneurysm -Appears to be larger than previous assessment done.  Currently reaching 4.8 cm. -Outpatient follow-up with vascular surgery and repeat CT abdomen/MRI in 18-month is recommended.  8-chronic renal failure stage IIIb -Creatinine appears to be stable and at baseline -Continue to closely follow trend in the setting of acute diuresis.  9-elevated troponin -In the setting of demand ischemia in the setting of CHF exacerbation. -2D echo reassuring with no wall motion abnormalities and a stable ejection fraction -Will follow trend -Continue treatment for CHF exacerbation.  Subjective:  Afebrile, no chest pain, no nausea, no vomiting.  Reporting intermittent dry coughing spells and is present improvement in her breathing.  Patient is currently requiring 5 L nasal cannula supplementation to maintain saturation.  Physical Exam: Vitals:   11/04/22 0830 11/04/22 0833 11/04/22 0845 11/04/22 0916  BP:    (!) 141/97  Pulse:    67  Resp:    (!) 26  Temp:      TempSrc:      SpO2: 95% 96% 97% 96%  Weight:  Height:       General exam: Alert, awake,  in good spirit and following commands appropriately; expressed breathing is improving and denies chest pain. Respiratory system: 5 L nasal cannula supplementation in place maintaining saturation.  Still with intermittent coughing spells, positive crackles at the bases and expiratory wheezing/rhonchi appreciated on examination. Cardiovascular system:Rate controlled. No rubs or gallops; positive murmur.  No JVD. Gastrointestinal system: Abdomen is nondistended, soft and nontender. No organomegaly or masses felt. Normal bowel sounds heard. Central nervous system: Alert and oriented. No focal neurological deficits. Extremities: No cyanosis or clubbing; no lower extremity edema appreciated.   Skin: No petechiae. Psychiatry: Judgement and insight appear normal. Mood & affect appropriate.   Data Reviewed: Basic metabolic panel: Sodium 921, potassium 6.1, chloride 102, bicarb 23, BUN 30, creatinine 1.0, GFR 55 TSH: 0.430 Magnesium: 2.4 UA with negative nitrites, no leukocyte esterase and a specific gravity 1.008  Family Communication: Daughter and grandson updated over the phone.  Disposition: Status is: Inpatient Remains inpatient appropriate because: Denies treatment with IV diuresis and IV steroids.   Planned Discharge Destination: Home  Time spent: 50 minutes  Author: Barton Dubois, MD 11/04/2022 10:31 AM  For on call review [email or web address].

## 2022-11-04 NOTE — Progress Notes (Signed)
  Echocardiogram 2D Echocardiogram has been performed.  Wynelle Link 11/04/2022, 11:55 AM

## 2022-11-05 DIAGNOSIS — D469 Myelodysplastic syndrome, unspecified: Secondary | ICD-10-CM | POA: Diagnosis not present

## 2022-11-05 DIAGNOSIS — J441 Chronic obstructive pulmonary disease with (acute) exacerbation: Secondary | ICD-10-CM | POA: Diagnosis not present

## 2022-11-05 DIAGNOSIS — N1832 Chronic kidney disease, stage 3b: Secondary | ICD-10-CM | POA: Diagnosis not present

## 2022-11-05 DIAGNOSIS — J9621 Acute and chronic respiratory failure with hypoxia: Secondary | ICD-10-CM | POA: Diagnosis not present

## 2022-11-05 LAB — BASIC METABOLIC PANEL
Anion gap: 7 (ref 5–15)
BUN: 35 mg/dL — ABNORMAL HIGH (ref 8–23)
CO2: 21 mmol/L — ABNORMAL LOW (ref 22–32)
Calcium: 8.3 mg/dL — ABNORMAL LOW (ref 8.9–10.3)
Chloride: 100 mmol/L (ref 98–111)
Creatinine, Ser: 1.34 mg/dL — ABNORMAL HIGH (ref 0.44–1.00)
GFR, Estimated: 39 mL/min — ABNORMAL LOW (ref >60)
Glucose, Bld: 132 mg/dL — ABNORMAL HIGH (ref 70–99)
Potassium: 4.9 mmol/L (ref 3.5–5.1)
Sodium: 128 mmol/L — ABNORMAL LOW (ref 135–145)

## 2022-11-05 LAB — URINALYSIS, ROUTINE W REFLEX MICROSCOPIC
Bilirubin Urine: NEGATIVE
Glucose, UA: NEGATIVE mg/dL
Hgb urine dipstick: NEGATIVE
Ketones, ur: NEGATIVE mg/dL
Leukocytes,Ua: NEGATIVE
Nitrite: NEGATIVE
Protein, ur: NEGATIVE mg/dL
Specific Gravity, Urine: 1.01 (ref 1.005–1.030)
pH: 5 (ref 5.0–8.0)

## 2022-11-05 MED ORDER — FUROSEMIDE 10 MG/ML IJ SOLN
40.0000 mg | Freq: Every day | INTRAMUSCULAR | Status: DC
  Administered 2022-11-06: 40 mg via INTRAVENOUS
  Filled 2022-11-05: qty 4

## 2022-11-05 MED ORDER — SODIUM CHLORIDE 0.9 % IV SOLN
2.0000 g | INTRAVENOUS | Status: DC
  Administered 2022-11-06 – 2022-11-07 (×2): 2 g via INTRAVENOUS
  Filled 2022-11-05 (×2): qty 12.5

## 2022-11-05 MED ORDER — SODIUM ZIRCONIUM CYCLOSILICATE 5 G PO PACK
5.0000 g | PACK | Freq: Two times a day (BID) | ORAL | Status: AC
  Administered 2022-11-05: 5 g via ORAL
  Filled 2022-11-05: qty 1

## 2022-11-05 NOTE — Progress Notes (Signed)
Progress Note   Patient: Lisa [name] BJS:[id number] DOB: 01-08-1934 DOA: 11/03/2022     2 DOS: the patient was seen and examined on 11/05/2022   Brief hospital course:  Thaila Bottoms is a 89 y.o. female with medical history significant of history of lung cancer, breast cancer and MDS, pancytopenia, asthma/COPD and diastolic heart failure; who presented to the hospital secondary to shortness of breath, decreased oral intake, loose stools, anorexia and productive coughing spells.  Patient reports symptom has been present for the last 4 days prior to admission.  Some of her loose stool has been dark in color after using Pepto-Bismol.  No hematemesis, no vomiting, no chest pain, no fever, no sick contacts, no focal weakness, no dysuria or any other complaints. Patient reports some orthopnea the night prior to admission and has also noticed unsure when the sensation.   Workup in the ED demonstrated elevated D-dimer with subsequent CT angiogram negative for pulmonary embolism but demonstrating vascular congestion suggesting CHF exacerbation and unable to rule out the presence of midlung infiltrate/pneumonia.  BNP was elevated and on physical examination patient found to be hypoxic requiring up to 15 L high flow nasal cannula supplementation to stabilize saturation.  Patient was wheezing and demonstrating poor air movement.   Steroids, bronchodilators, antibiotics and IV Lasix provided at time of admission.  TRH contacted to place patient in the hospital for further evaluation and management.  Assessment and Plan: 1-acute on chronic respiratory failure with hypoxia -Patient chronically using 2 L nasal cannula supplementation at bedtime. -At time of admission and requiring 15 L high flow nasal cannula supplementation to maintain saturation. -Acute triggering cause attributed to COPD exacerbation, bronchiectasis/pneumonia and the presence of acute on chronic diastolic heart failure. -Continue treatment  with steroids, bronchodilator management, antibiotics and IV diuresis. -Patient also receiving Pulmicort and Brovana along with mucolytic's and the use of flutter valve. -Continue daily weights, strict I's and O's and follow electrolytes/renal function with diuresis. -Oxygen supplementation 7 L and slowly improving.  2-hyperkalemia  -Lokelma provided -Continue telemetry monitoring and follow electrolytes trend -Bronchodilator management and the use of diuretics will help with stabilizing electrolytes.   -Patient potassium 4.9.  3-Hypothyroidism -Continue Synthroid -Outpatient follow-up of thyroid panel with intention to further adjust Synthroid regimen as needed.  4-pancytopenia -Given low platelets count we will avoid heparin products and use SCDs for DVT prophylaxis.   -Continue to follow CBC -No overt bleeding appreciated.  5-history of MDS -Continue patient follow-up with oncology service -No overt bleeding appreciated. -Continue to follow complete blood count cells trend.  6-hypertension -Appears to be stable -Continue current antihypertensive agents. -Heart healthy diet discussed with patient.  7-abdominal aortic aneurysm -Appears to be larger than previous assessment done.  Currently reaching 4.8 cm. -Outpatient follow-up with vascular surgery and repeat CT abdomen/MRI in 33-month is recommended.  8-chronic renal failure stage IIIb -Creatinine appears to be stable and at baseline -Continue to closely follow trend in the setting of acute diuresis. -Creatinine remained stable currently.  9-elevated troponin -In the setting of demand ischemia in the setting of CHF exacerbation. -2D echo reassuring with no wall motion abnormalities and a stable ejection fraction -Troponin continues trending down; patient denies chest pain. -Continue treatment for CHF exacerbation.  Subjective:  Overnight requiring high level of oxygen supplementation; currently 7 L nasal cannula in  place.  No chest pain, no nausea, no vomiting.  Overall feeling better.  Physical Exam: Vitals:   11/05/22 0843 11/05/22 1127 11/05/22 1446 11/05/22 1608  BP:      Pulse:      Resp:      Temp:  97.9 F (36.6 C)  (!) 97.3 F (36.3 C)  TempSrc:  Oral  Oral  SpO2: 99%  94%   Weight:      Height:       General exam: Alert, awake, oriented x 3; overall she is feeling better; denies chest pain, no nausea or vomiting.  Overnight requiring high level of oxygen supplementation.  Still short winded with activity. Respiratory system: The creased breath sounds at the bases; positive rhonchi at and mild expiratory wheezing on exam.  7 L nasal cannula supplementation in place. Cardiovascular system:RRR.  No rubs, no gallops, no JVD.  Positive murmur appreciated on exam. Gastrointestinal system: Abdomen is nondistended, soft and nontender. No organomegaly or masses felt. Normal bowel sounds heard. Central nervous system: Alert and oriented. No focal neurological deficits. Extremities: No cyanosis or clubbing; no edema. Skin: No petechiae. Psychiatry: Judgement and insight appear normal. Mood & affect appropriate.   Data Reviewed: Basic metabolic panel: Sodium 076, potassium 4.9, chloride 100, bicarb 21, glucose 132, BUN 35, creatinine 134, GFR 39. TSH: 0.430 Magnesium: 2.4 UA with negative nitrites, no leukocyte esterase and a specific gravity 1.008  Family Communication: Daughter and grandson updated over the phone.  Disposition: Status is: Inpatient  Remains inpatient appropriate because: Denies treatment with IV diuresis and IV steroids.   Planned Discharge Destination: Home  Time spent: 50 minutes  Author: Barton Dubois, MD 11/05/2022 5:34 PM  For on call review [email or web address].

## 2022-11-05 NOTE — Progress Notes (Signed)
PHARMACY NOTE:  ANTIMICROBIAL RENAL DOSAGE ADJUSTMENT  Current antimicrobial regimen includes a mismatch between antimicrobial dosage and estimated renal function.  As per policy approved by the Pharmacy & Therapeutics and Medical Executive Committees, the antimicrobial dosage will be adjusted accordingly.  Current antimicrobial dosage:  Cefepime 2gm IV q12h  Indication: COPD exacerbation/PNA  Renal Function:  Estimated Creatinine Clearance: 26.3 mL/min (A) (by C-G formula based on SCr of 1.34 mg/dL (H)). []      On intermittent HD, scheduled: []      On CRRT    Antimicrobial dosage has been changed to:  Cefepime 2gm IV q24h    Thank you for allowing pharmacy to be a part of this patient's care.  Isac Sarna, BS Pharm D, BCPS Clinical Pharmacist 11/05/2022 2:01 PM

## 2022-11-05 NOTE — Evaluation (Signed)
Physical Therapy Evaluation Patient Details Name: Lisa [name] MRN: [id number] DOB: [name] 11, 1934 Today's Date: 11/05/2022  History of Present Illness  Lisa [name] is a 89 y.o. female with medical history significant of history of lung cancer, breast cancer and MDS, pancytopenia, asthma/COPD and diastolic heart failure; who presented to the hospital secondary to shortness of breath, decreased oral intake, loose stools, anorexia and productive coughing spells.  Patient reports symptom has been present for the last 4 days prior to admission.  Some of her loose stool has been dark in color after using Pepto-Bismol.  No hematemesis, no vomiting, no chest pain, no fever, no sick contacts, no focal weakness, no dysuria or any other complaints.  Clinical Impression  Pt agreeable to therapy.  States she had 9 wks of [place] therapy at her home.  Pt seems to be limited by respiratory rather than physical issues at this time.        Recommendations for follow up therapy are one component of a multi-disciplinary discharge planning process, led by the attending physician.  Recommendations may be updated based on patient status, additional functional criteria and insurance authorization.  Follow Up Recommendations Home health PT      Assistance Recommended at Discharge Intermittent Supervision/Assistance  Patient can return home with the following  A little help with bathing/dressing/bathroom;Assistance with cooking/housework;A little help with walking and/or transfers    Equipment Recommendations None recommended by PT  Recommendations for Other Services       Functional Status Assessment Patient has had a recent decline in their functional status and demonstrates the ability to make significant improvements in function in a reasonable and predictable amount of time.     Precautions / Restrictions Precautions Precautions: None Restrictions Weight Bearing Restrictions: No      Mobility  Bed  Mobility Overal bed mobility: Modified Independent                  Transfers Overall transfer level: Modified independent                      Ambulation/Gait Ambulation/Gait assistance: Modified independent (Device/Increase time) Gait Distance (Feet): 8 Feet Assistive device: Rolling walker (2 wheels) Gait Pattern/deviations: Decreased step length - right, Decreased step length - left               Pertinent Vitals/Pain Pain Assessment Pain Assessment: No/denies pain    Home Living Family/patient expects to be discharged to:: Private residence Living Arrangements: Children Available Help at Discharge: Available PRN/intermittently;Family Type of Home: House Home Access: Stairs to enter   Entrance Stairs-Number of Steps: 4 Alternate Level Stairs-Number of Steps: 14 Home Layout: Two level Home Equipment: Conservation officer, nature (2 wheels);Cane - quad;BSC/3in1;Grab bars - tub/shower;Wheelchair - manual;Other (comment) Additional Comments: Pt reports living with daughter . Pt on supplemental O2 at home.    Prior Function Prior Level of Function : Needs assist  Cognitive Assist : Mobility (cognitive)     Physical Assist : Mobility (physical);ADLs (physical) Mobility (physical): Transfers;Gait;Bed mobility;Stairs ADLs (physical): IADLs;Bathing Mobility Comments: Pt reports household ambulation with RW and use of quad cane at grocery store. ADLs Comments: Assist needed for bathing. Independnet goorming, feeding, dressing, toileting. Assted for IADL's by family.     Hand Dominance   Dominant Hand: Right    Extremity/Trunk Assessment        Lower Extremity Assessment Lower Extremity Assessment: Overall WFL for tasks assessed       Communication   Communication: [facility]  Cognition Arousal/Alertness: Awake/alert Behavior During Therapy: WFL for tasks assessed/performed Overall Cognitive Status: Within Functional Limits for tasks assessed                                              Exercises General Exercises - Lower Extremity Long Arc Quad: Both, 10 reps Heel Slides: Both, 10 reps Hip ABduction/ADduction: Standing (side stepping at bedside x 2 rt) Hip Flexion/Marching: Both, 10 reps   Assessment/Plan    PT Assessment Patient needs continued PT services  PT Problem List Decreased strength;Decreased balance       PT Treatment Interventions Therapeutic exercise;Therapeutic activities;Balance training    PT Goals (Current goals can be found in the Care Plan section)       Frequency Min 2X/week        AM-PAC PT "6 Clicks" Mobility  Outcome Measure Help needed turning from your back to your side while in a flat bed without using bedrails?: None Help needed moving from lying on your back to sitting on the side of a flat bed without using bedrails?: None Help needed moving to and from a bed to a chair (including a wheelchair)?: None Help needed standing up from a chair using your arms (e.g., wheelchair or bedside chair)?: None Help needed to walk in hospital room?: A Little Help needed climbing 3-5 steps with a railing? : A Lot 6 Click Score: 21    End of Session Equipment Utilized During Treatment: Gait belt Activity Tolerance: Patient tolerated treatment well Patient left: in chair;with call bell/phone within reach;with family/visitor present Nurse Communication: Mobility status PT Visit Diagnosis: Unsteadiness on feet (R26.81)    Time: 0355-9741 PT Time Calculation (min) (ACUTE ONLY): 28 min   Charges:   PT Evaluation $PT Eval Low Complexity: 1 Low PT Treatments $Therapeutic Exercise: 8-22 mins      Rayetta Humphrey, PT CLT [phone]  11/05/2022, 4:29 PM

## 2022-11-06 DIAGNOSIS — J9621 Acute and chronic respiratory failure with hypoxia: Secondary | ICD-10-CM | POA: Diagnosis not present

## 2022-11-06 DIAGNOSIS — N1832 Chronic kidney disease, stage 3b: Secondary | ICD-10-CM | POA: Diagnosis not present

## 2022-11-06 DIAGNOSIS — J441 Chronic obstructive pulmonary disease with (acute) exacerbation: Secondary | ICD-10-CM | POA: Diagnosis not present

## 2022-11-06 DIAGNOSIS — D469 Myelodysplastic syndrome, unspecified: Secondary | ICD-10-CM | POA: Diagnosis not present

## 2022-11-06 LAB — BASIC METABOLIC PANEL
Anion gap: 6 (ref 5–15)
BUN: 36 mg/dL — ABNORMAL HIGH (ref 8–23)
CO2: 25 mmol/L (ref 22–32)
Calcium: 8.8 mg/dL — ABNORMAL LOW (ref 8.9–10.3)
Chloride: 103 mmol/L (ref 98–111)
Creatinine, Ser: 1.51 mg/dL — ABNORMAL HIGH (ref 0.44–1.00)
GFR, Estimated: 33 mL/min — ABNORMAL LOW (ref >60)
Glucose, Bld: 97 mg/dL (ref 70–99)
Potassium: 4.5 mmol/L (ref 3.5–5.1)
Sodium: 134 mmol/L — ABNORMAL LOW (ref 135–145)

## 2022-11-06 MED ORDER — FUROSEMIDE 40 MG PO TABS
40.0000 mg | ORAL_TABLET | Freq: Every day | ORAL | Status: DC
  Administered 2022-11-07: 40 mg via ORAL
  Filled 2022-11-06: qty 1

## 2022-11-06 MED ORDER — PREDNISONE 20 MG PO TABS
60.0000 mg | ORAL_TABLET | Freq: Every day | ORAL | Status: DC
  Administered 2022-11-06 – 2022-11-07 (×2): 60 mg via ORAL
  Filled 2022-11-06 (×2): qty 3

## 2022-11-06 NOTE — Progress Notes (Addendum)
Progress Note   Patient: Lisa [name] XNA:[id number] DOB: [birth year] DOA: 11/03/2022     3 DOS: the patient was seen and examined on 11/06/2022   Brief hospital course:  Lisa [name] is a 89 y.o. female with medical history significant of history of lung cancer, breast cancer and MDS, pancytopenia, asthma/COPD and diastolic heart failure; who presented to the hospital secondary to shortness of breath, decreased oral intake, loose stools, anorexia and productive coughing spells.  Patient reports symptom has been present for the last 4 days prior to admission.  Some of her loose stool has been dark in color after using Pepto-Bismol.  No hematemesis, no vomiting, no chest pain, no fever, no sick contacts, no focal weakness, no dysuria or any other complaints. Patient reports some orthopnea the night prior to admission and has also noticed unsure when the sensation.   Workup in the ED demonstrated elevated D-dimer with subsequent CT angiogram negative for pulmonary embolism but demonstrating vascular congestion suggesting CHF exacerbation and unable to rule out the presence of midlung infiltrate/pneumonia.  BNP was elevated and on physical examination patient found to be hypoxic requiring up to 15 L high flow nasal cannula supplementation to stabilize saturation.  Patient was wheezing and demonstrating poor air movement.   Steroids, bronchodilators, antibiotics and IV Lasix provided at time of admission.  TRH contacted to place patient in the hospital for further evaluation and management.  Assessment and Plan: 1-acute on chronic respiratory failure with hypoxia -Patient chronically using 2 L nasal cannula supplementation at bedtime. -At time of admission and requiring 15 L high flow nasal cannula supplementation to maintain saturation. -Acute triggering cause attributed to COPD exacerbation, bronchiectasis/pneumonia and the presence of acute on chronic diastolic heart failure. -Continue treatment  with steroids, bronchodilator management, antibiotics and diuretics; after achieving volume and stability diuretics will be transitioned to oral route. -Patient also receiving Pulmicort and Brovana along with mucolytic's and the use of flutter valve. -Continue daily weights, strict I's and O's and follow electrolytes/renal function with diuresis. -Oxygen supplementation 5 L and continue slowly improving. -Patient will be transfer to telemetry bed and hopefully discharge in the next 24-48 hours.  2-hyperkalemia  -Lokelma provided -Continue telemetry monitoring and follow electrolytes trend -Bronchodilator management and the use of diuretics will help with stabilizing electrolytes.   -Patient potassium 4.5 currently and is stable.  3-Hypothyroidism -Continue Synthroid -Outpatient follow-up of thyroid panel with intention to further adjust Synthroid regimen as needed.  4-pancytopenia -Given low platelets count we will avoid heparin products and use SCDs for DVT prophylaxis.   -Continue to follow CBC -No overt bleeding appreciated.  5-history of MDS -Continue patient follow-up with oncology service -No overt bleeding appreciated. -Continue to follow complete blood count cells trend.  6-hypertension -Appears to be stable -Continue current antihypertensive agents. -Heart healthy diet discussed with patient.  7-abdominal aortic aneurysm -Appears to be larger than previous assessment done.  Currently reaching 4.8 cm. -Outpatient follow-up with vascular surgery and repeat CT abdomen/MRI in 65-month is recommended.  8-chronic renal failure stage IIIb -Creatinine slightly elevated after achieving volume status stability. -Transitioning diuretics to oral route and continue to follow renal function trend.  9-elevated troponin -In the setting of demand ischemia in the setting of CHF exacerbation. -2D echo reassuring with no wall motion abnormalities and a stable ejection fraction -Troponin  continues trending down; patient denies chest pain. -Continue treatment for CHF exacerbation. -Telemetry evaluation demonstrated no acute ischemic changes.  10-hyponatremia -in the setting of hypervolemia most likely -  after diuresis -sodium level 134 range -continue to follow trend  Subjective:  No overnight events; patient oxygen saturation improved and only requiring around 5 L nasal cannula currently.  Reports no chest pain, no nausea, no vomiting.  Good urine output appreciated.  Physical Exam: Vitals:   11/06/22 0600 11/06/22 0700 11/06/22 0831 11/06/22 0909  BP: (!) 143/69 (!) 147/57    Pulse: 69 67    Resp: (!) 26 (!) 26    Temp:   97.6 F (36.4 C)   TempSrc:   Oral   SpO2: 95% 94%  96%  Weight:      Height:       General exam: Alert, awake, oriented x 3; afebrile, no chest pain, no nausea, no vomiting.  Still requiring higher than baseline 2 L nasal cannula supplementation. Respiratory system: Mild expiratory wheezing and tachypnea especially with exertion; no frank crackles appreciated on exam.  Decreased breath sounds at the bases.  No using accessory muscle. Cardiovascular system: Rate controlled, no rubs, no gallops, no JVD on exam. Gastrointestinal system: Abdomen is nondistended, soft and nontender. No organomegaly or masses felt. Normal bowel sounds heard. Central nervous system: Alert and oriented. No focal neurological deficits. Extremities: No cyanosis or clubbing. Skin: No petechiae. Psychiatry: Judgement and insight appear normal. Mood & affect appropriate.   Data Reviewed: Basic metabolic panel: Sodium 244, potassium 4.5, chloride 103, bicarb 27, BUN 36, creatinine 1.51 and GFR 33. TSH: 0.430 Magnesium: 2.4 UA with negative nitrites, no leukocyte esterase and a specific gravity 1.008  Family Communication: Daughter and grandson updated over the phone.  Disposition: Status is: Inpatient  Remains inpatient appropriate because: Denies treatment with IV  diuresis and IV steroids.   Planned Discharge Destination: Home  Time spent: 50 minutes  Author: Barton Dubois, MD 11/06/2022 9:14 AM  For on call review [email or web address].

## 2022-11-06 NOTE — Care Management Important Message (Signed)
Important Message  Patient Details  Name: Lisa [name] MRN: [id number] Date of Birth: 12-13-1933   Medicare Important Message Given:  Yes (copy left on bedside table)     Tommy Medal 11/06/2022, 1:17 PM

## 2022-11-07 DIAGNOSIS — E875 Hyperkalemia: Secondary | ICD-10-CM | POA: Diagnosis not present

## 2022-11-07 DIAGNOSIS — J441 Chronic obstructive pulmonary disease with (acute) exacerbation: Secondary | ICD-10-CM | POA: Diagnosis not present

## 2022-11-07 DIAGNOSIS — J9621 Acute and chronic respiratory failure with hypoxia: Secondary | ICD-10-CM | POA: Diagnosis not present

## 2022-11-07 DIAGNOSIS — D61818 Other pancytopenia: Secondary | ICD-10-CM | POA: Diagnosis not present

## 2022-11-07 DIAGNOSIS — R197 Diarrhea, unspecified: Secondary | ICD-10-CM

## 2022-11-07 LAB — BASIC METABOLIC PANEL
Anion gap: 8 (ref 5–15)
BUN: 35 mg/dL — ABNORMAL HIGH (ref 8–23)
CO2: 25 mmol/L (ref 22–32)
Calcium: 9.2 mg/dL (ref 8.9–10.3)
Chloride: 101 mmol/L (ref 98–111)
Creatinine, Ser: 1.25 mg/dL — ABNORMAL HIGH (ref 0.44–1.00)
GFR, Estimated: 42 mL/min — ABNORMAL LOW (ref >60)
Glucose, Bld: 125 mg/dL — ABNORMAL HIGH (ref 70–99)
Potassium: 4.6 mmol/L (ref 3.5–5.1)
Sodium: 134 mmol/L — ABNORMAL LOW (ref 135–145)

## 2022-11-07 MED ORDER — FUROSEMIDE 40 MG PO TABS: 40.0000 mg | ORAL_TABLET | Freq: Every day | ORAL | 2 refills | Status: DC

## 2022-11-07 MED ORDER — AMLODIPINE BESYLATE 10 MG PO TABS: 5.0000 mg | ORAL_TABLET | Freq: Every day | ORAL | Status: DC

## 2022-11-07 MED ORDER — PANTOPRAZOLE SODIUM 40 MG PO TBEC: 40.0000 mg | DELAYED_RELEASE_TABLET | Freq: Every day | ORAL | 1 refills | Status: DC

## 2022-11-07 MED ORDER — AMOXICILLIN-POT CLAVULANATE 500-125 MG PO TABS: 1.0000 | ORAL_TABLET | Freq: Two times a day (BID) | ORAL | 0 refills | Status: AC

## 2022-11-07 MED ORDER — PREDNISONE 20 MG PO TABS: ORAL_TABLET | ORAL | 0 refills | Status: DC

## 2022-11-07 NOTE — Progress Notes (Signed)
Pt currently on 4L O2, reports baseline needs are 2L at home.  Denies SHOB.  Infrequent NP cough.  LS diminished.  No pain.  NSR on monitor.

## 2022-11-07 NOTE — TOC Transition Note (Signed)
Transition of Care [facility]) - CM/SW Discharge Note   Patient Details  Name: Chalon Zobrist MRN: [id number] Date of Birth: [birth year]  [facility] CM/SW Contact:  Boneta Lucks, RN Phone Number: 11/07/2022, 12:53 PM   Clinical Narrative:   Patient discharging home. PT recommending HHPT. TOC spoke with patient yesterday. Waiting to set up home health at discharge.  Georgina Snell with [facility] accepted the referral for HHPT.   Final next level of care: [place] Barriers to Discharge: Continued Medical Work up   Patient Goals and CMS Choice     Discharge Plan and Services Additional resources added to the After Visit Summary for          [facility] Arranged: PT HH Agency: [place] Date [place]: 11/07/22 Time [place]: [phone] Representative spoke with at [place]: [place] Determinants of Health ([place]) Interventions SDOH Screenings   Food Insecurity: No Food Insecurity (11/03/2022)  Housing: Low Risk  (11/03/2022)  Transportation Needs: No Transportation Needs (11/03/2022)  Utilities: Not At Risk (11/03/2022)  Depression (PHQ2-9): Low Risk  (12/12/2020)  Physical Activity: Inactive (12/12/2020)  Tobacco Use: Medium Risk (11/03/2022)     Readmission Risk Interventions    08/28/2021    1:06 PM  Readmission Risk Prevention Plan  Transportation Screening Complete  HRI or [place] Complete  Social Work Consult for [place] Planning/Counseling Complete  Palliative Care Screening Not Applicable  Medication Review Press photographer) Complete

## 2022-11-07 NOTE — Discharge Summary (Signed)
Physician Discharge Summary   Patient: Lisa [name] MRN: [id number] DOB: 07/23/1934  Admit date:     11/03/2022  Discharge date: 11/07/22  Discharge Physician: Barton Dubois   PCP: Celene Squibb, MD   Recommendations at discharge:  Repeat basic metabolic panel to follow electrolytes and renal function Reassess blood pressure and adjust antihypertensive treatment as needed Continue to follow-up on patient's ability to be weaned off oxygen supplementation; she has been discharged on 3 L and was normally just on 2L prior to admission. Repeat chest x-ray to follow complete resolution of infiltrates.  Discharge Diagnoses: Principal Problem:   Acute on chronic respiratory failure with hypoxia (HCC) Active Problems:   MDS (myelodysplastic syndrome) (HCC)   Hypothyroidism   Chronic renal failure (CRF), stage 3b (HCC)   Acute exacerbation of chronic obstructive airways disease (HCC)   Abdominal aortic aneurysm   HTN (hypertension)   Acute on chronic diastolic CHF (congestive heart failure) (HCC)   Pancytopenia (HCC)   Hyperkalemia   Diarrhea  Hospital Course: Lisa [name] is a 89 y.o. female with medical history significant of history of lung cancer, breast cancer and MDS, pancytopenia, asthma/COPD and diastolic heart failure; who presented to the hospital secondary to shortness of breath, decreased oral intake, loose stools, anorexia and productive coughing spells.  Patient reports symptom has been present for the last 4 days prior to admission.  Some of her loose stool has been dark in color after using Pepto-Bismol.  No hematemesis, no vomiting, no chest pain, no fever, no sick contacts, no focal weakness, no dysuria or any other complaints. Patient reports some orthopnea the night prior to admission and has also noticed unsure when the sensation.   Workup in the ED demonstrated elevated D-dimer with subsequent CT angiogram negative for pulmonary embolism but demonstrating vascular  congestion suggesting CHF exacerbation and unable to rule out the presence of midlung infiltrate/pneumonia.  BNP was elevated and on physical examination patient found to be hypoxic requiring up to 15 L high flow nasal cannula supplementation to stabilize saturation.  Patient was wheezing and demonstrating poor air movement.   Steroids, bronchodilators, antibiotics and IV Lasix provided at time of admission.  TRH contacted to place patient in the hospital for further evaluation and management.  Assessment and Plan: 1-acute on chronic respiratory failure with hypoxia -Patient chronically using 2 L nasal cannula supplementation at bedtime. -At time of admission and requiring 15 L high flow nasal cannula supplementation to maintain saturation. -Acute triggering cause attributed to COPD exacerbation, bronchiectasis/pneumonia and the presence of acute on chronic diastolic heart failure. -Continue treatment with oral diuretics. -Discharged home on oral antibiotics, steroids tapering and resumption of home bronchodilator management. -Continue daily weight and adequate hydration. -Oxygen supplementation 3 L at discharge and demonstrating ability to speak in full sentences.  She wants to go home. -Patient discharged home with instructions to follow-up with PCP in 10 days.   2-hyperkalemia  -Lokelma provided -Continue telemetry monitoring and follow electrolytes trend -Bronchodilator management and the use of diuretics will help with stabilizing electrolytes.   -Patient potassium 4.5 currently and is stable.   3-Hypothyroidism -Continue Synthroid -Outpatient follow-up of thyroid panel with intention to further adjust Synthroid regimen as needed.   4-pancytopenia -Given low platelets count heparin products were avoided for DVT prophylaxis. -Continue to follow CBC to assess cells count -No overt bleeding appreciated.   5-history of MDS -Continue patient follow-up with oncology service -No overt  bleeding appreciated. -Continue to follow complete blood count cells  trend.   6-hypertension -Appears to be stable and well-controlled. -Continue current antihypertensive agents at adjusted dosage. -Heart healthy diet discussed with patient.   7-abdominal aortic aneurysm -Appears to be larger than previous assessment done.  Currently reaching 4.8 cm. -Outpatient follow-up with vascular surgery and repeat CT abdomen/MRI in 67-month is recommended.   8-chronic renal failure stage IIIb -Creatinine slightly elevated after achieving volume status stability; subsequent blood work demonstrating stable renal function at her baseline. -Continue to follow trend. -Patient advised to maintain adequate hydration.   9-elevated troponin -In the setting of demand ischemia in the setting of CHF exacerbation. -2D echo reassuring with no wall motion abnormalities and a stable ejection fraction -Troponin continues trending down appropriately; patient denies chest pain. -Continue treatment for CHF exacerbation. -Telemetry evaluation demonstrated no acute ischemic changes.   10-hyponatremia -in the setting of hypervolemia most likely -after diuresis sodium level 134 range at time of discharge. -continue to follow trend with repeat basic metabolic panel follow-up visit.  Consultants: None Procedures performed: See below for x-ray reports. Disposition: Home with home health services. Diet recommendation: Heart healthy diet.  DISCHARGE MEDICATION: Allergies as of 11/07/2022       Reactions   Meloxicam Other (See Comments)   Caused an injury to the kidneys, per nephrologist   Micardis Hct [telmisartan-hctz] Other (See Comments)   Caused an injury to the kidneys, per nephrologist        Medication List     STOP taking these medications    OXYGEN   senna-docusate 8.6-50 MG tablet Commonly known as: Senokot-S   sodium chloride 1 g tablet       TAKE these medications    acetaminophen  325 MG tablet Commonly known as: TYLENOL Take 2 tablets (650 mg total) by mouth every 6 (six) hours.   albuterol 108 (90 Base) MCG/ACT inhaler Commonly known as: VENTOLIN HFA Inhale 1-2 puffs into the lungs every 4 (four) hours as needed for wheezing or shortness of breath.   amLODipine 10 MG tablet Commonly known as: NORVASC Take 0.5 tablets (5 mg total) by mouth daily. What changed: how much to take   amoxicillin-clavulanate 500-125 MG tablet Commonly known as: Augmentin Take 1 tablet by mouth every 12 (twelve) hours for 5 days.   aspirin EC 81 MG tablet Take 1 tablet (81 mg total) by mouth daily. Swallow whole.   furosemide 40 MG tablet Commonly known as: LASIX Take 1 tablet (40 mg total) by mouth daily. Start taking on: November 08, 2022   gabapentin 100 MG capsule Commonly known as: Neurontin Take 1 capsule (100 mg total) by mouth every 12 (twelve) hours as needed (neuropathy).   Incruse Ellipta 62.5 MCG/ACT Aepb Generic drug: umeclidinium bromide Inhale 1 puff into the lungs daily.   levothyroxine 100 MCG tablet Commonly known as: SYNTHROID Take 1 tablet (100 mcg total) by mouth daily before breakfast.   magnesium oxide 400 MG tablet Commonly known as: MAG-OX Take 400 mg by mouth 2 (two) times daily.   metoprolol succinate 25 MG 24 hr tablet Commonly known as: Toprol XL Take 1 tablet (25 mg total) by mouth daily.   mirtazapine 7.5 MG tablet Commonly known as: REMERON Take 1 tablet (7.5 mg total) by mouth at bedtime.   Multi-Vitamin tablet Take 1 tablet by mouth daily.   pantoprazole 40 MG tablet Commonly known as: PROTONIX Take 1 tablet (40 mg total) by mouth daily. Start taking on: November 08, 2022   predniSONE 20 MG tablet Commonly  known as: DELTASONE Take 3 tablets by mouth daily x 1 day; then 2 tablet by mouth daily x 2 days; then 1 tablet by mouth daily x 3 days; then half tablet by mouth daily x 3 days and stop prednisone. Start taking on: November 08, 2022   Vitamin D (Ergocalciferol) 1.25 MG (50000 UNIT) Caps capsule Commonly known as: DRISDOL Take 50,000 Units by mouth once a week.        Follow-up Information     [facility] Follow up.   Specialty: Home Health Services Why: PT will call to schedule your first home visit. Contact information: [place] [address] [phone]         Celene Squibb, MD. Schedule an appointment as soon as possible for a visit in 10 day(s).   Specialty: Internal Medicine Contact information: [place] [address] [phone]                Discharge Exam: Emery Weights   11/05/22 0500 11/06/22 0542 11/06/22 1415  Weight: 66.5 kg 63.6 kg 66.2 kg   General exam: Alert, awake, oriented x 3; afebrile, no chest pain, no nausea, no vomiting.  Still requiring 3 L nasal cannula supplementation at time of discharge; but feeling ready and improving would like to go home.Marland Kitchen Respiratory system: Able to speak in full sentences; no wheezing on examination and demonstrating no use of accessory muscles.  Good saturation on 3 L supplementation in place. Cardiovascular system: Rate controlled, no rubs, no gallops, no JVD on exam. Gastrointestinal system: Abdomen is nondistended, soft and nontender. No organomegaly or masses felt. Normal bowel sounds heard. Central nervous system: Alert and oriented. No focal neurological deficits. Extremities: No cyanosis or clubbing; no lower extremity edema appreciated. Skin: No petechiae. Psychiatry: Judgement and insight appear normal. Mood & affect appropriate.   Condition at discharge: Stable and improved.  The results of significant diagnostics from this hospitalization (including imaging, microbiology, ancillary and laboratory) are listed below for reference.   Imaging Studies: ECHOCARDIOGRAM COMPLETE  Result Date: 11/04/2022    ECHOCARDIOGRAM REPORT   Patient Name:   Lisa [name] Date of  Exam: 11/04/2022 Medical Rec #:  [id number]      Height:       61.0 in Accession #:    [id number]     Weight:       146.8 lb Date of Birth:  1934-03-20      BSA:          1.656 m Patient Age:    61 years       BP:           141/97 mmHg Patient Gender: F              HR:           65 bpm. Exam Location:  Inpatient Procedure: 2D Echo, Color Doppler and Cardiac Doppler Indications:    CHF-Acute Diastolic B16.94  History:        Patient has prior history of Echocardiogram examinations, most                 recent 02/11/2022. CHF, TIA, Signs/Symptoms:Shortness of Breath;                 Risk Factors:Hypertension.  Sonographer:    Greer Pickerel Referring Phys: [phone] Smantha Boakye  Sonographer Comments: Image acquisition challenging due to patient body habitus. IMPRESSIONS  1. Left ventricular ejection  fraction, by estimation, is 65 to 70%. The left ventricle has normal function. The left ventricle has no regional wall motion abnormalities. There is mild left ventricular hypertrophy. Left ventricular diastolic parameters are consistent with Grade III diastolic dysfunction (restrictive).  2. Right ventricular systolic function is mildly reduced. The right ventricular size is mildly enlarged. There is moderately elevated pulmonary artery systolic pressure. The estimated right ventricular systolic pressure is 84.1 mmHg.  3. Left atrial size was severely dilated.  4. Right atrial size was severely dilated.  5. The mitral valve is degenerative. Mild to moderate mitral valve regurgitation.  6. Tricuspid valve regurgitation is mild to moderate.  7. The aortic valve is tricuspid. There is moderate calcification of the aortic valve. Aortic valve regurgitation is not visualized. Moderate to severe aortic valve stenosis, upper end of scale. Aortic valve mean gradient measures 22.0 mmHg. Aortic valve Vmax measures 3.02 m/s. Dimentionless index 0.28.  8. The inferior vena cava is dilated in size with <50% respiratory variability, suggesting  right atrial pressure of 15 mmHg. Comparison(s): Prior images reviewed side by side. LVEF vigorous with restrictive diastolic filling pattern. Moderately elevated estimated RVSP. Moderate to severe, paradoxical low gradient aortic stenosis. Mean AV gradient has increased from 18 mmHg to 22 mmHg. FINDINGS  Left Ventricle: Left ventricular ejection fraction, by estimation, is 65 to 70%. The left ventricle has normal function. The left ventricle has no regional wall motion abnormalities. The left ventricular internal cavity size was normal in size. There is  mild left ventricular hypertrophy. Left ventricular diastolic parameters are consistent with Grade III diastolic dysfunction (restrictive). Right Ventricle: The right ventricular size is mildly enlarged. No increase in right ventricular wall thickness. Right ventricular systolic function is mildly reduced. There is moderately elevated pulmonary artery systolic pressure. The tricuspid regurgitant velocity is 3.00 m/s, and with an assumed right atrial pressure of 15 mmHg, the estimated right ventricular systolic pressure is 66.0 mmHg. Left Atrium: Left atrial size was severely dilated. Right Atrium: Right atrial size was severely dilated. Pericardium: There is no evidence of pericardial effusion. Mitral Valve: The mitral valve is degenerative in appearance. There is mild thickening of the mitral valve leaflet(s). There is mild calcification of the mitral valve leaflet(s). Mild mitral annular calcification. Mild to moderate mitral valve regurgitation. MV peak gradient, 4.8 mmHg. The mean mitral valve gradient is 1.0 mmHg. Tricuspid Valve: The tricuspid valve is grossly normal. Tricuspid valve regurgitation is mild to moderate. Aortic Valve: The aortic valve is tricuspid. There is moderate calcification of the aortic valve. There is mild aortic valve annular calcification. Aortic valve regurgitation is not visualized. Moderate to severe aortic stenosis is present.  Aortic valve mean gradient measures 22.0 mmHg. Aortic valve peak gradient measures 36.6 mmHg. Aortic valve area, by VTI measures 0.79 cm. Pulmonic Valve: The pulmonic valve was grossly normal. Pulmonic valve regurgitation is mild. Aorta: The aortic root is normal in size and structure. Venous: The inferior vena cava is dilated in size with less than 50% respiratory variability, suggesting right atrial pressure of 15 mmHg. IAS/Shunts: No atrial level shunt detected by color flow Doppler.  LEFT VENTRICLE PLAX 2D LVIDd:         3.70 cm   Diastology LVIDs:         2.20 cm   LV e' medial:    4.60 cm/s LV PW:         1.30 cm   LV E/e' medial:  25.7 LV IVS:  1.10 cm   LV e' lateral:   7.98 cm/s LVOT diam:     1.90 cm   LV E/e' lateral: 14.8 LV SV:         57 LV SV Index:   34 LVOT Area:     2.84 cm  RIGHT VENTRICLE RV S prime:     11.90 cm/s TAPSE (M-mode): 1.8 cm LEFT ATRIUM             Index        RIGHT ATRIUM           Index LA diam:        4.50 cm 2.72 cm/m   RA Area:     30.80 cm LA Vol (A2C):   85.5 ml 51.62 ml/m  RA Volume:   108.00 ml 65.21 ml/m LA Vol (A4C):   97.1 ml 58.63 ml/m LA Biplane Vol: 98.8 ml 59.65 ml/m  AORTIC VALVE                     PULMONIC VALVE AV Area (Vmax):    0.80 cm      PR End Diast Vel: 2.83 msec AV Area (Vmean):   0.78 cm AV Area (VTI):     0.79 cm AV Vmax:           302.33 cm/s AV Vmean:          207.667 cm/s AV VTI:            0.718 m AV Peak Grad:      36.6 mmHg AV Mean Grad:      22.0 mmHg LVOT Vmax:         85.30 cm/s LVOT Vmean:        57.000 cm/s LVOT VTI:          0.200 m LVOT/AV VTI ratio: 0.28  AORTA Ao Root diam: 3.50 cm Ao Asc diam:  3.00 cm MITRAL VALVE                TRICUSPID VALVE MV Area (PHT): 4.63 cm     TR Peak grad:   36.0 mmHg MV Area VTI:   2.14 cm     TR Vmax:        300.00 cm/s MV Peak grad:  4.8 mmHg MV Mean grad:  1.0 mmHg     SHUNTS MV Vmax:       1.10 m/s     Systemic VTI:  0.20 m MV Vmean:      46.5 cm/s    Systemic Diam: 1.90 cm MV Decel  Time: 164 msec MR Peak grad: 68.2 mmHg MR Vmax:      413.00 cm/s MV E velocity: 118.00 cm/s MV A velocity: 58.30 cm/s MV E/A ratio:  2.02 Rozann Lesches MD Electronically signed by Rozann Lesches MD Signature Date/Time: 11/04/2022/12:50:23 PM    Final    CT Angio Chest PE W and/or Wo Contrast  Result Date: 11/03/2022 CLINICAL DATA:  Shortness of breath with nonproductive cough, trouble urinating and diarrhea for 3 days. On home oxygen. EXAM: CT ANGIOGRAPHY CHEST CT ABDOMEN AND PELVIS WITH CONTRAST TECHNIQUE: Multidetector CT imaging of the chest was performed using the standard protocol during bolus administration of intravenous contrast. Multiplanar CT image reconstructions and MIPs were obtained to evaluate the vascular anatomy. Multidetector CT imaging of the abdomen and pelvis was performed using the standard protocol during bolus administration of intravenous contrast. RADIATION DOSE REDUCTION: This exam was performed according to the  departmental dose-optimization program which includes automated exposure control, adjustment of the mA and/or kV according to patient size and/or use of iterative reconstruction technique. CONTRAST:  53mL OMNIPAQUE IOHEXOL 350 MG/ML SOLN COMPARISON:  CTs of the chest, abdomen and pelvis 10/24/2020. FINDINGS: CTA CHEST FINDINGS Cardiovascular: The pulmonary arteries are well opacified with contrast to the level of the subsegmental branches. There is no evidence of acute pulmonary embolism. No acute systemic arterial abnormalities are identified. There is diffuse atherosclerosis of the aorta, great vessels and coronary arteries. There are calcifications of the aortic valve. The heart is moderately enlarged. No significant pericardial fluid. Mediastinum/Nodes: There are no enlarged mediastinal, hilar or axillary lymph nodes. The thyroid gland, trachea and esophagus demonstrate no significant findings. Lungs/Pleura: Small right-greater-than-left pleural effusions with associated  dependent atelectasis at both lung bases, worse on the right. Underlying moderate centrilobular and paraseptal emphysema with superimposed patchy ground-glass opacities suspicious for edema. There are stable postsurgical changes from previous left upper lobe wedge resection. No suspicious pulmonary nodules. Musculoskeletal/Chest wall: No chest wall mass or suspicious osseous findings. Multilevel thoracic spondylosis. CT ABDOMEN AND PELVIS FINDINGS Hepatobiliary: The liver is normal in density without suspicious focal abnormality. There is generalized periportal edema with mild gallbladder wall thickening and pericholecystic fluid. No calcified gallstones or significant biliary dilatation. Pancreas: Unremarkable. No pancreatic ductal dilatation or surrounding inflammatory changes. Spleen: Normal in size without focal abnormality. Adrenals/Urinary Tract: Both adrenal glands appear normal. No evidence of urinary tract calculus, suspicious renal lesion or hydronephrosis. The bladder appears normal for its degree of distention. Stomach/Bowel: Enteric contrast was administered and has passed into the distal colon. The stomach appears unremarkable for its degree of distension. No evidence of bowel wall thickening, distention or surrounding inflammatory change. The appendix appears normal. Vascular/Lymphatic: There are no enlarged abdominal or pelvic lymph nodes. Diffuse aortic and branch vessel atherosclerosis with fusiform dilatation of the infrarenal abdominal aorta. This now has a maximal diameter of 4.8 cm (previously 4.4 cm when measured in a similar fashion). No evidence of aneurysm leak or rupture. No evidence of large vessel occlusion. Reproductive: The uterus and ovaries appear unremarkable. No adnexal mass. Other: Moderate free pelvic fluid of undetermined etiology. Trace generalized ascites. No pneumoperitoneum or focal extraluminal fluid collection. Musculoskeletal: No acute or significant osseous findings.  Mild spondylosis. Review of the MIP images confirms the above findings. IMPRESSION: 1. No evidence of acute pulmonary embolism or other acute vascular findings in the chest. 2. Cardiomegaly with bilateral pleural effusions, patchy ground-glass opacities and periportal hepatic edema suspicious for congestive heart failure. 3. Moderate free pelvic fluid of undetermined etiology, potentially related to the patient's fluid overload. 4. Fusiform infrarenal abdominal aortic aneurysm, now measuring up to 4.8 cm in maximal diameter, mildly enlarged from previous study 2 years ago. Recommend follow-up CT/MR every 6 months and vascular consultation. This recommendation follows ACR consensus guidelines: White Paper of the ACR Incidental Findings Committee II on Vascular Findings. J Am Coll Radiol 2013; 10:789-794. 5. No evidence of bowel obstruction or perforation. The appendix appears normal. 6. Aortic Atherosclerosis (ICD10-I70.0) and Emphysema (ICD10-J43.9). Electronically Signed   By: Richardean Sale M.D.   On: 11/03/2022 12:37   CT ABDOMEN PELVIS W CONTRAST  Result Date: 11/03/2022 CLINICAL DATA:  Shortness of breath with nonproductive cough, trouble urinating and diarrhea for 3 days. On home oxygen. EXAM: CT ANGIOGRAPHY CHEST CT ABDOMEN AND PELVIS WITH CONTRAST TECHNIQUE: Multidetector CT imaging of the chest was performed using the standard protocol during bolus administration of  intravenous contrast. Multiplanar CT image reconstructions and MIPs were obtained to evaluate the vascular anatomy. Multidetector CT imaging of the abdomen and pelvis was performed using the standard protocol during bolus administration of intravenous contrast. RADIATION DOSE REDUCTION: This exam was performed according to the departmental dose-optimization program which includes automated exposure control, adjustment of the mA and/or kV according to patient size and/or use of iterative reconstruction technique. CONTRAST:  45mL OMNIPAQUE  IOHEXOL 350 MG/ML SOLN COMPARISON:  CTs of the chest, abdomen and pelvis 10/24/2020. FINDINGS: CTA CHEST FINDINGS Cardiovascular: The pulmonary arteries are well opacified with contrast to the level of the subsegmental branches. There is no evidence of acute pulmonary embolism. No acute systemic arterial abnormalities are identified. There is diffuse atherosclerosis of the aorta, great vessels and coronary arteries. There are calcifications of the aortic valve. The heart is moderately enlarged. No significant pericardial fluid. Mediastinum/Nodes: There are no enlarged mediastinal, hilar or axillary lymph nodes. The thyroid gland, trachea and esophagus demonstrate no significant findings. Lungs/Pleura: Small right-greater-than-left pleural effusions with associated dependent atelectasis at both lung bases, worse on the right. Underlying moderate centrilobular and paraseptal emphysema with superimposed patchy ground-glass opacities suspicious for edema. There are stable postsurgical changes from previous left upper lobe wedge resection. No suspicious pulmonary nodules. Musculoskeletal/Chest wall: No chest wall mass or suspicious osseous findings. Multilevel thoracic spondylosis. CT ABDOMEN AND PELVIS FINDINGS Hepatobiliary: The liver is normal in density without suspicious focal abnormality. There is generalized periportal edema with mild gallbladder wall thickening and pericholecystic fluid. No calcified gallstones or significant biliary dilatation. Pancreas: Unremarkable. No pancreatic ductal dilatation or surrounding inflammatory changes. Spleen: Normal in size without focal abnormality. Adrenals/Urinary Tract: Both adrenal glands appear normal. No evidence of urinary tract calculus, suspicious renal lesion or hydronephrosis. The bladder appears normal for its degree of distention. Stomach/Bowel: Enteric contrast was administered and has passed into the distal colon. The stomach appears unremarkable for its degree  of distension. No evidence of bowel wall thickening, distention or surrounding inflammatory change. The appendix appears normal. Vascular/Lymphatic: There are no enlarged abdominal or pelvic lymph nodes. Diffuse aortic and branch vessel atherosclerosis with fusiform dilatation of the infrarenal abdominal aorta. This now has a maximal diameter of 4.8 cm (previously 4.4 cm when measured in a similar fashion). No evidence of aneurysm leak or rupture. No evidence of large vessel occlusion. Reproductive: The uterus and ovaries appear unremarkable. No adnexal mass. Other: Moderate free pelvic fluid of undetermined etiology. Trace generalized ascites. No pneumoperitoneum or focal extraluminal fluid collection. Musculoskeletal: No acute or significant osseous findings. Mild spondylosis. Review of the MIP images confirms the above findings. IMPRESSION: 1. No evidence of acute pulmonary embolism or other acute vascular findings in the chest. 2. Cardiomegaly with bilateral pleural effusions, patchy ground-glass opacities and periportal hepatic edema suspicious for congestive heart failure. 3. Moderate free pelvic fluid of undetermined etiology, potentially related to the patient's fluid overload. 4. Fusiform infrarenal abdominal aortic aneurysm, now measuring up to 4.8 cm in maximal diameter, mildly enlarged from previous study 2 years ago. Recommend follow-up CT/MR every 6 months and vascular consultation. This recommendation follows ACR consensus guidelines: White Paper of the ACR Incidental Findings Committee II on Vascular Findings. J Am Coll Radiol 2013; 10:789-794. 5. No evidence of bowel obstruction or perforation. The appendix appears normal. 6. Aortic Atherosclerosis (ICD10-I70.0) and Emphysema (ICD10-J43.9). Electronically Signed   By: Richardean Sale M.D.   On: 11/03/2022 12:37   DG Chest Port 1 View  Result Date: 11/03/2022 CLINICAL  DATA:  Shortness of breath. Decreased O2 sats. Coughing congestion. EXAM:  PORTABLE CHEST 1 VIEW COMPARISON:  08/28/2021 FINDINGS: 0746 hours. The cardio pericardial silhouette is enlarged. Interstitial markings are diffusely coarsened with chronic features. Bibasilar patchy airspace disease suggests pneumonia. Small left pleural effusion evident. The visualized bony structures of the thorax are unremarkable. Telemetry leads overlie the chest. IMPRESSION: 1. Bibasilar patchy airspace disease suggests pneumonia. 2. Small left pleural effusion. Electronically Signed   By: Misty Stanley M.D.   On: 11/03/2022 07:55    Microbiology: Results for orders placed or performed during the hospital encounter of 11/03/22  Resp panel by RT-PCR (RSV, Flu A&B, Covid) Anterior Nasal Swab     Status: None   Collection Time: 11/03/22  7:45 AM   Specimen: Anterior Nasal Swab  Result Value Ref Range Status   SARS Coronavirus 2 by RT PCR NEGATIVE NEGATIVE Final    Comment: (NOTE) SARS-CoV-2 target nucleic acids are NOT DETECTED.  The SARS-CoV-2 RNA is generally detectable in upper respiratory specimens during the acute phase of infection. The lowest concentration of SARS-CoV-2 viral copies this assay can detect is 138 copies/mL. A negative result does not preclude SARS-Cov-2 infection and should not be used as the sole basis for treatment or other patient management decisions. A negative result may occur with  improper specimen collection/handling, submission of specimen other than nasopharyngeal swab, presence of viral mutation(s) within the areas targeted by this assay, and inadequate number of viral copies(<138 copies/mL). A negative result must be combined with clinical observations, patient history, and epidemiological information. The expected result is Negative.  Fact Sheet for Patients:  EntrepreneurPulse.com.au  Fact Sheet for Healthcare Providers:  IncredibleEmployment.be  This test is no t yet approved or cleared by the [place]  FDA and  has been authorized for detection and/or diagnosis of SARS-CoV-2 by FDA under an Emergency Use Authorization (EUA). This EUA will remain  in effect (meaning this test can be used) for the duration of the COVID-19 declaration under Section 564(b)(1) of the Act, 21 U.S.C.section 360bbb-3(b)(1), unless the authorization is terminated  or revoked sooner.       Influenza A by PCR NEGATIVE NEGATIVE Final   Influenza B by PCR NEGATIVE NEGATIVE Final    Comment: (NOTE) The Xpert Xpress SARS-CoV-2/FLU/RSV plus assay is intended as an aid in the diagnosis of influenza from Nasopharyngeal swab specimens and should not be used as a sole basis for treatment. Nasal washings and aspirates are unacceptable for Xpert Xpress SARS-CoV-2/FLU/RSV testing.  Fact Sheet for Patients: EntrepreneurPulse.com.au  Fact Sheet for Healthcare Providers: IncredibleEmployment.be  This test is not yet approved or cleared by the [place] FDA and has been authorized for detection and/or diagnosis of SARS-CoV-2 by FDA under an Emergency Use Authorization (EUA). This EUA will remain in effect (meaning this test can be used) for the duration of the COVID-19 declaration under Section 564(b)(1) of the Act, 21 U.S.C. section 360bbb-3(b)(1), unless the authorization is terminated or revoked.     Resp Syncytial Virus by PCR NEGATIVE NEGATIVE Final    Comment: (NOTE) Fact Sheet for Patients: EntrepreneurPulse.com.au  Fact Sheet for Healthcare Providers: IncredibleEmployment.be  This test is not yet approved or cleared by the [place] FDA and has been authorized for detection and/or diagnosis of SARS-CoV-2 by FDA under an Emergency Use Authorization (EUA). This EUA will remain in effect (meaning this test can be used) for the duration of the COVID-19 declaration under Section 564(b)(1) of the Act, 21 U.S.C.  section  360bbb-3(b)(1), unless the authorization is terminated or revoked.  Performed at [facility], [address] [place], [place] [address]   MRSA Next Gen by PCR, Nasal     Status: None   Collection Time: 11/03/22  5:07 PM   Specimen: Nasal Mucosa; Nasal Swab  Result Value Ref Range Status   MRSA by PCR Next Gen NOT DETECTED NOT DETECTED Final    Comment: (NOTE) The GeneXpert MRSA Assay (FDA approved for NASAL specimens only), is one component of a comprehensive MRSA colonization surveillance program. It is not intended to diagnose MRSA infection nor to guide or monitor treatment for MRSA infections. Test performance is not FDA approved in patients less than 103 years old. Performed at [facility], [address] [place], [place] [address]     Labs: CBC: Recent Labs  Lab 11/03/22 0744  WBC 4.4  NEUTROABS 0.2*  HGB 9.7*  HCT 35.6*  MCV 104.4*  PLT 73*   Basic Metabolic Panel: Recent Labs  Lab 11/03/22 1132 11/03/22 1923 11/04/22 0511 11/04/22 1554 11/05/22 0507 11/06/22 0323 11/07/22 0538  NA 126*  --  131* 126* 128* 134* 134*  K 5.2*  --  6.1* 5.3* 4.9 4.5 4.6  CL 99  --  102 98 100 103 101  CO2 20*  --  23 20* 21* 25 25  GLUCOSE 109*  --  122* 155* 132* 97 125*  BUN 32*  --  30* 31* 35* 36* 35*  CREATININE 1.04*  --  1.00 1.12* 1.34* 1.51* 1.25*  CALCIUM 8.1*  --  8.1* 8.2* 8.3* 8.8* 9.2  MG  --  2.4  --   --   --   --   --   PHOS 3.8  --   --   --   --   --   --    Liver Function Tests: Recent Labs  Lab 11/03/22 0744  AST 65*  ALT 45*  ALKPHOS 65  BILITOT 0.8  PROT 9.2*  ALBUMIN 3.5   CBG: No results for input(s): "GLUCAP" in the last 168 hours.  Discharge time spent: greater than 30 minutes.  Signed: Barton Dubois, MD [facility] 11/07/2022

## 2022-11-12 DIAGNOSIS — Z9181 History of falling: Secondary | ICD-10-CM | POA: Diagnosis not present

## 2022-11-12 DIAGNOSIS — J439 Emphysema, unspecified: Secondary | ICD-10-CM | POA: Diagnosis not present

## 2022-11-12 DIAGNOSIS — Z7952 Long term (current) use of systemic steroids: Secondary | ICD-10-CM | POA: Diagnosis not present

## 2022-11-12 DIAGNOSIS — I700 Atherosclerosis of aorta: Secondary | ICD-10-CM | POA: Diagnosis not present

## 2022-11-12 DIAGNOSIS — I088 Other rheumatic multiple valve diseases: Secondary | ICD-10-CM | POA: Diagnosis not present

## 2022-11-12 DIAGNOSIS — Z9012 Acquired absence of left breast and nipple: Secondary | ICD-10-CM | POA: Diagnosis not present

## 2022-11-12 DIAGNOSIS — J9621 Acute and chronic respiratory failure with hypoxia: Secondary | ICD-10-CM | POA: Diagnosis not present

## 2022-11-12 DIAGNOSIS — I5033 Acute on chronic diastolic (congestive) heart failure: Secondary | ICD-10-CM | POA: Diagnosis not present

## 2022-11-12 DIAGNOSIS — N1832 Chronic kidney disease, stage 3b: Secondary | ICD-10-CM | POA: Diagnosis not present

## 2022-11-12 DIAGNOSIS — Z853 Personal history of malignant neoplasm of breast: Secondary | ICD-10-CM | POA: Diagnosis not present

## 2022-11-12 DIAGNOSIS — M1990 Unspecified osteoarthritis, unspecified site: Secondary | ICD-10-CM | POA: Diagnosis not present

## 2022-11-12 DIAGNOSIS — D469 Myelodysplastic syndrome, unspecified: Secondary | ICD-10-CM | POA: Diagnosis not present

## 2022-11-12 DIAGNOSIS — I130 Hypertensive heart and chronic kidney disease with heart failure and stage 1 through stage 4 chronic kidney disease, or unspecified chronic kidney disease: Secondary | ICD-10-CM | POA: Diagnosis not present

## 2022-11-12 DIAGNOSIS — Z87891 Personal history of nicotine dependence: Secondary | ICD-10-CM | POA: Diagnosis not present

## 2022-11-12 DIAGNOSIS — Z7982 Long term (current) use of aspirin: Secondary | ICD-10-CM | POA: Diagnosis not present

## 2022-11-12 DIAGNOSIS — J441 Chronic obstructive pulmonary disease with (acute) exacerbation: Secondary | ICD-10-CM | POA: Diagnosis not present

## 2022-11-12 DIAGNOSIS — E039 Hypothyroidism, unspecified: Secondary | ICD-10-CM | POA: Diagnosis not present

## 2022-11-12 DIAGNOSIS — E875 Hyperkalemia: Secondary | ICD-10-CM | POA: Diagnosis not present

## 2022-11-12 DIAGNOSIS — Z85118 Personal history of other malignant neoplasm of bronchus and lung: Secondary | ICD-10-CM | POA: Diagnosis not present

## 2022-11-12 DIAGNOSIS — I714 Abdominal aortic aneurysm, without rupture, unspecified: Secondary | ICD-10-CM | POA: Diagnosis not present

## 2022-11-13 DIAGNOSIS — Z8673 Personal history of transient ischemic attack (TIA), and cerebral infarction without residual deficits: Secondary | ICD-10-CM | POA: Diagnosis not present

## 2022-11-13 DIAGNOSIS — R809 Proteinuria, unspecified: Secondary | ICD-10-CM | POA: Diagnosis not present

## 2022-11-13 DIAGNOSIS — D509 Iron deficiency anemia, unspecified: Secondary | ICD-10-CM | POA: Diagnosis not present

## 2022-11-13 DIAGNOSIS — J9621 Acute and chronic respiratory failure with hypoxia: Secondary | ICD-10-CM | POA: Diagnosis not present

## 2022-11-13 DIAGNOSIS — I10 Essential (primary) hypertension: Secondary | ICD-10-CM | POA: Diagnosis not present

## 2022-11-13 DIAGNOSIS — I4891 Unspecified atrial fibrillation: Secondary | ICD-10-CM | POA: Diagnosis not present

## 2022-11-13 DIAGNOSIS — E039 Hypothyroidism, unspecified: Secondary | ICD-10-CM | POA: Diagnosis not present

## 2022-11-13 DIAGNOSIS — R627 Adult failure to thrive: Secondary | ICD-10-CM | POA: Diagnosis not present

## 2022-11-13 DIAGNOSIS — N1832 Chronic kidney disease, stage 3b: Secondary | ICD-10-CM | POA: Diagnosis not present

## 2022-11-13 DIAGNOSIS — Z85110 Personal history of malignant carcinoid tumor of bronchus and lung: Secondary | ICD-10-CM | POA: Diagnosis not present

## 2022-11-13 DIAGNOSIS — E875 Hyperkalemia: Secondary | ICD-10-CM | POA: Diagnosis not present

## 2022-11-13 DIAGNOSIS — D469 Myelodysplastic syndrome, unspecified: Secondary | ICD-10-CM | POA: Diagnosis not present

## 2022-11-17 DIAGNOSIS — J9621 Acute and chronic respiratory failure with hypoxia: Secondary | ICD-10-CM | POA: Diagnosis not present

## 2022-11-17 DIAGNOSIS — I130 Hypertensive heart and chronic kidney disease with heart failure and stage 1 through stage 4 chronic kidney disease, or unspecified chronic kidney disease: Secondary | ICD-10-CM | POA: Diagnosis not present

## 2022-11-17 DIAGNOSIS — I5033 Acute on chronic diastolic (congestive) heart failure: Secondary | ICD-10-CM | POA: Diagnosis not present

## 2022-11-17 DIAGNOSIS — J439 Emphysema, unspecified: Secondary | ICD-10-CM | POA: Diagnosis not present

## 2022-11-17 DIAGNOSIS — J441 Chronic obstructive pulmonary disease with (acute) exacerbation: Secondary | ICD-10-CM | POA: Diagnosis not present

## 2022-11-17 DIAGNOSIS — N1832 Chronic kidney disease, stage 3b: Secondary | ICD-10-CM | POA: Diagnosis not present

## 2022-11-19 ENCOUNTER — Inpatient Hospital Stay: Payer: Medicare Other

## 2022-11-19 ENCOUNTER — Inpatient Hospital Stay: Payer: Medicare Other | Attending: Hematology

## 2022-11-19 VITALS — BP 124/83 | HR 85 | Temp 98.4°F | Resp 18

## 2022-11-19 DIAGNOSIS — D539 Nutritional anemia, unspecified: Secondary | ICD-10-CM | POA: Insufficient documentation

## 2022-11-19 DIAGNOSIS — D4620 Refractory anemia with excess of blasts, unspecified: Secondary | ICD-10-CM | POA: Diagnosis not present

## 2022-11-19 DIAGNOSIS — Z79899 Other long term (current) drug therapy: Secondary | ICD-10-CM | POA: Diagnosis not present

## 2022-11-19 DIAGNOSIS — D469 Myelodysplastic syndrome, unspecified: Secondary | ICD-10-CM

## 2022-11-19 LAB — CBC
HCT: 35.5 % — ABNORMAL LOW (ref 36.0–46.0)
Hemoglobin: 10.1 g/dL — ABNORMAL LOW (ref 12.0–15.0)
MCH: 29.5 pg (ref 26.0–34.0)
MCHC: 28.5 g/dL — ABNORMAL LOW (ref 30.0–36.0)
MCV: 103.8 fL — ABNORMAL HIGH (ref 80.0–100.0)
Platelets: 82 10*3/uL — ABNORMAL LOW (ref 150–400)
RBC: 3.42 MIL/uL — ABNORMAL LOW (ref 3.87–5.11)
RDW: 20.1 % — ABNORMAL HIGH (ref 11.5–15.5)
WBC: 8.5 10*3/uL (ref 4.0–10.5)
nRBC: 1.1 % — ABNORMAL HIGH (ref 0.0–0.2)

## 2022-11-19 MED ORDER — DARBEPOETIN ALFA 200 MCG/0.4ML IJ SOSY
200.0000 ug | PREFILLED_SYRINGE | Freq: Once | INTRAMUSCULAR | Status: AC
  Administered 2022-11-19: 200 ug via SUBCUTANEOUS
  Filled 2022-11-19: qty 0.4

## 2022-11-19 NOTE — Progress Notes (Signed)
Aranesp 200 mg given today per MD orders. Tolerated infusion without adverse affects. Vital signs stable. No complaints at this time. Discharged from clinic via wheelchair in stable condition. Alert and oriented x 3. F/U with [facility] as scheduled.

## 2022-11-19 NOTE — Progress Notes (Signed)
Patient recent hospitalization for Pneumonia and completed Augmentin as directed.  Used oxygen at home for nighttime.  Rechecked temperature with 98.4 orally.  Patient wearing a toboggan for first temperature 101.4.  No complaints voiced.  Provider made aware of vital signs and hospitalization. OK to give shot verbal order Dr. [name]!

## 2022-11-19 NOTE — Patient Instructions (Signed)
[facility]  Discharge Instructions: Thank you for choosing [facility] to provide your oncology and hematology care.  If you have a lab appointment with the Cancer Center, please come in thru the Main Entrance and check in at the main information desk.  Wear comfortable clothing and clothing appropriate for easy access to any Portacath or PICC line.   We strive to give you quality time with your provider. You may need to reschedule your appointment if you arrive late (15 or more minutes).  Arriving late affects you and other patients whose appointments are after yours.  Also, if you miss three or more appointments without notifying the office, you may be dismissed from the clinic at the provider's discretion.      For prescription refill requests, have your pharmacy contact our office and allow 72 hours for refills to be completed.    Today you received the following chemotherapy and/or immunotherapy agents Aranesp 200 mg   To help prevent nausea and vomiting after your treatment, we encourage you to take your nausea medication as directed.  BELOW ARE SYMPTOMS THAT SHOULD BE REPORTED IMMEDIATELY: *FEVER GREATER THAN 100.4 F (38 C) OR HIGHER *CHILLS OR SWEATING *NAUSEA AND VOMITING THAT IS NOT CONTROLLED WITH YOUR NAUSEA MEDICATION *UNUSUAL SHORTNESS OF BREATH *UNUSUAL BRUISING OR BLEEDING *URINARY PROBLEMS (pain or burning when urinating, or frequent urination) *BOWEL PROBLEMS (unusual diarrhea, constipation, pain near the anus) TENDERNESS IN MOUTH AND THROAT WITH OR WITHOUT PRESENCE OF ULCERS (sore throat, sores in mouth, or a toothache) UNUSUAL RASH, SWELLING OR PAIN  UNUSUAL VAGINAL DISCHARGE OR ITCHING   Items with * indicate a potential emergency and should be followed up as soon as possible or go to the Emergency Department if any problems should occur.  Please show the CHEMOTHERAPY ALERT CARD or IMMUNOTHERAPY ALERT CARD at check-in to the  Emergency Department and triage nurse.  Should you have questions after your visit or need to cancel or reschedule your appointment, please contact [facility] [phone]  and follow the prompts.  Office hours are 8:00 a.m. to 4:30 p.m. Monday - Friday. Please note that voicemails left after 4:00 p.m. may not be returned until the following business day.  We are closed weekends and major holidays. You have access to a nurse at all times for urgent questions. Please call the main number to the clinic [phone] and follow the prompts.  For any non-urgent questions, you may also contact your provider using MyChart. We now offer e-Visits for anyone 34 and older to request care online for non-urgent symptoms. For details visit [email or web address].   Also download the MyChart app! Go to the app store, search "MyChart", open the app, select [place], and log in with your MyChart username and password.

## 2022-11-20 ENCOUNTER — Encounter: Payer: Self-pay | Admitting: Internal Medicine

## 2022-11-20 ENCOUNTER — Ambulatory Visit: Payer: Medicare Other | Admitting: Internal Medicine

## 2022-11-20 ENCOUNTER — Ambulatory Visit: Payer: Medicare Other | Attending: Internal Medicine | Admitting: Internal Medicine

## 2022-11-20 VITALS — BP 98/60 | HR 68 | Wt 138.0 lb

## 2022-11-20 DIAGNOSIS — I350 Nonrheumatic aortic (valve) stenosis: Secondary | ICD-10-CM

## 2022-11-20 DIAGNOSIS — R0902 Hypoxemia: Secondary | ICD-10-CM

## 2022-11-20 DIAGNOSIS — I340 Nonrheumatic mitral (valve) insufficiency: Secondary | ICD-10-CM | POA: Diagnosis not present

## 2022-11-20 DIAGNOSIS — I5033 Acute on chronic diastolic (congestive) heart failure: Secondary | ICD-10-CM | POA: Diagnosis not present

## 2022-11-20 DIAGNOSIS — N1832 Chronic kidney disease, stage 3b: Secondary | ICD-10-CM | POA: Diagnosis not present

## 2022-11-20 DIAGNOSIS — J9621 Acute and chronic respiratory failure with hypoxia: Secondary | ICD-10-CM | POA: Diagnosis not present

## 2022-11-20 DIAGNOSIS — J441 Chronic obstructive pulmonary disease with (acute) exacerbation: Secondary | ICD-10-CM | POA: Diagnosis not present

## 2022-11-20 DIAGNOSIS — I714 Abdominal aortic aneurysm, without rupture, unspecified: Secondary | ICD-10-CM | POA: Diagnosis not present

## 2022-11-20 DIAGNOSIS — J439 Emphysema, unspecified: Secondary | ICD-10-CM | POA: Diagnosis not present

## 2022-11-20 DIAGNOSIS — I130 Hypertensive heart and chronic kidney disease with heart failure and stage 1 through stage 4 chronic kidney disease, or unspecified chronic kidney disease: Secondary | ICD-10-CM | POA: Diagnosis not present

## 2022-11-20 NOTE — Addendum Note (Signed)
Addended by: Kerney Elbe on: 11/20/2022 03:36 PM   Modules accepted: Orders

## 2022-11-20 NOTE — Progress Notes (Signed)
Cardiology Office Note  Date: 11/20/2022   ID: Lisa [name], DOB 21-Sep-1934, MRN [id number]  PCP:  [name], MD  Cardiologist:  None Electrophysiologist:  None   Reason for Office Visit: AFib   History of Present Illness: Lisa [name] is a 89 y.o. female known to have paroxysmal Afib on aspirin (due to MDS, anemia and thrombocytopenia), HTN, COPD on home oxygen 2L, breast cancer s/p lumpectomy and radiation, history of lung cancer s/p LUL lobectomy and MDS presents to cardiology clinic for follow-up visit. Accompanied by daughter.  Patient was recently admitted to [facility] in 11/2022 for acute hypoxic respiratory failure secondary to COPD exacerbation and was discharged home in stable condition. Echocardiogram from 11/2022 showed normal LVEF and grade 3 diastolic dysfunction. She received treatment for COPD exacerbation and continued p.o. Lasix and discharged home. She is here for follow-up visit. Patient is not physically active at home and needs assistance with daily activities. Denies any symptoms of chest pain, SOB, dizziness, presyncope, syncope, palpitations and leg swelling. Patient's daughter reported low blood pressures after hospital discharge.  Past Medical History:  Diagnosis Date   Adenocarcinoma of left lung (HCC) 2006   Arthritis    Asthma    Cancer of breast, female (HCC)    Cancer of lung (HCC)    Hypertension    Hypothyroidism    Invasive ductal carcinoma of left breast (HCC) 1999   Neutropenia (HCC) 06/09/2016   Personal history of radiation therapy     Past Surgical History:  Procedure Laterality Date   BIOPSY  10/23/2020   Procedure: BIOPSY;  Surgeon: Benancio Deeds, MD;  Location: [facility] ENDOSCOPY;  Service: Gastroenterology;;   BREAST BIOPSY     left axillary node dissection   BREAST LUMPECTOMY Left    COLONOSCOPY  03/08/2003   CYJ:ZSYFEPHFYV rectal polyps destroyed with the tip of the snare/Polyps at hepatic flexure, splenic flexure  at 35 cm/Left-sided diverticula: unable to retrieve path   COLONOSCOPY  09/12/2008   WRQ:BHWRDW rectum and distal sigmoid diminutive polyps/scattered left sided diverticulum. hyperplastic   COLONOSCOPY N/A 03/06/2013   FZE:AOKCNVP polyp-removed as described above; colonic diverticulosis. hyperplastic polyps. next TCS 03/2018   ESOPHAGOGASTRODUODENOSCOPY (EGD) WITH PROPOFOL N/A 10/23/2020   Procedure: ESOPHAGOGASTRODUODENOSCOPY (EGD) WITH PROPOFOL;  Surgeon: Benancio Deeds, MD;  Location: [facility] ENDOSCOPY;  Service: Gastroenterology;  Laterality: N/A;   FOOT SURGERY     LUNG REMOVAL, PARTIAL     upper lobe    Current Outpatient Medications  Medication Sig Dispense Refill   acetaminophen (TYLENOL) 325 MG tablet Take 2 tablets (650 mg total) by mouth every 6 (six) hours.     albuterol (VENTOLIN HFA) 108 (90 Base) MCG/ACT inhaler Inhale 1-2 puffs into the lungs every 4 (four) hours as needed for wheezing or shortness of breath.     amLODipine (NORVASC) 10 MG tablet Take 0.5 tablets (5 mg total) by mouth daily.     amoxicillin-clavulanate (AUGMENTIN) 500-125 MG tablet Take 1 tablet by mouth 3 (three) times daily.     aspirin EC 81 MG EC tablet Take 1 tablet (81 mg total) by mouth daily. Swallow whole. 30 tablet 11   furosemide (LASIX) 40 MG tablet Take 1 tablet (40 mg total) by mouth daily. 30 tablet 2   gabapentin (NEURONTIN) 100 MG capsule Take 1 capsule (100 mg total) by mouth every 12 (twelve) hours as needed (neuropathy). 60 capsule 1   levothyroxine (SYNTHROID) 100 MCG tablet Take 1 tablet (100 mcg total) by  mouth daily before breakfast. 30 tablet 1   magnesium oxide (MAG-OX) 400 MG tablet Take 400 mg by mouth 2 (two) times daily.     metoprolol succinate (TOPROL XL) 25 MG 24 hr tablet Take 1 tablet (25 mg total) by mouth daily. 90 tablet 2   mirtazapine (REMERON) 7.5 MG tablet Take 1 tablet (7.5 mg total) by mouth at bedtime. 30 tablet 2   Multiple Vitamin (MULTI-VITAMIN) tablet Take 1  tablet by mouth daily.     pantoprazole (PROTONIX) 40 MG tablet Take 1 tablet (40 mg total) by mouth daily. 30 tablet 1   predniSONE (DELTASONE) 20 MG tablet Take 3 tablets by mouth daily x 1 day; then 2 tablet by mouth daily x 2 days; then 1 tablet by mouth daily x 3 days; then half tablet by mouth daily x 3 days and stop prednisone. 12 tablet 0   umeclidinium bromide (INCRUSE ELLIPTA) 62.5 MCG/INH AEPB Inhale 1 puff into the lungs daily.     Vitamin D, Ergocalciferol, (DRISDOL) 1.25 MG (50000 UNIT) CAPS capsule Take 50,000 Units by mouth once a week.     No current facility-administered medications for this visit.   Allergies:  Meloxicam and Micardis hct [telmisartan-hctz]   Social History: The patient  reports that she quit smoking about 31 years ago. Her smoking use included cigarettes. She has a 26.25 pack-year smoking history. She has never used smokeless tobacco. She reports that she does not drink alcohol and does not use drugs.   Family History: The patient's family history includes Cancer in her mother and sister.   ROS:  Please see the history of present illness. Otherwise, complete review of systems is positive for none.  All other systems are reviewed and negative.   Physical Exam: VS:  BP 98/60 (BP Location: Left Arm, Patient Position: Sitting, Cuff Size: Normal)   Pulse 68   Wt 138 lb (62.6 kg)   SpO2 (!) 81%   BMI 22.96 kg/m , BMI Body mass index is 22.96 kg/m.  Wt Readings from Last 3 Encounters:  11/20/22 138 lb (62.6 kg)  11/06/22 145 lb 15.1 oz (66.2 kg)  08/27/22 138 lb 7.2 oz (62.8 kg)    General: Patient appears comfortable at rest. HEENT: Conjunctiva and lids normal, oropharynx clear with moist mucosa. Neck: Supple, no elevated JVP or carotid bruits, no thyromegaly. Lungs: Clear to auscultation, nonlabored breathing at rest. Cardiac: Regular rate and rhythm, no S3 or significant systolic murmur, no pericardial rub. Abdomen: Soft, nontender, no hepatomegaly,  bowel sounds present, no guarding or rebound. Extremities: No pitting edema, distal pulses 2+. Skin: Warm and dry. Musculoskeletal: No kyphosis. Neuropsychiatric: Alert and oriented x3, affect grossly appropriate.  ECG:  An ECG dated 11/20/2022 was personally reviewed today and demonstrated:  NSR and no ST-T changes  Recent Labwork: 11/03/2022: ALT 45; AST 65; B Natriuretic Peptide 743.0; Magnesium 2.4; TSH 0.430 11/07/2022: BUN 35; Creatinine, Ser 1.25; Potassium 4.6; Sodium 134 11/19/2022: Hemoglobin 10.1; Platelets 82     Component Value Date/Time   CHOL 106 02/11/2022 0505   TRIG 49 02/11/2022 0505   HDL 31 (L) 02/11/2022 0505   CHOLHDL 3.4 02/11/2022 0505   VLDL 10 02/11/2022 0505   LDLCALC 65 02/11/2022 0505    Other Studies Reviewed Today: Echo from 11/04/2022 LVEF 65 to 16% Grade 3 diastolic dysfunction (restrictive) RA and LA severely dilated Mild to moderate MR Moderate to severe aortic valve stenosis  Assessment and Plan: Patient is a 89 year old F known  to have paroxysmal Afib on aspirin (due to MDS, anemia and thrombocytopenia), HTN, COPD on home oxygen 2L, breast cancer s/p lumpectomy and radiation, history of lung cancer s/p LUL lobectomy and MDS presents to cardiology clinic for follow-up visit. Accompanied by daughter.  # Paroxysmal A-fib -Continue metoprolol succinate 25 mg once daily -Continue Aspirin 81 mg once daily. Not a candidate for systemic anticoagulation due to MDS (anemia and thrombocytopenia, 78 K recently). Not a candidate for watchman implantation due to multiple comorbidities and MDS.  # HFpEF -Continue Lasix 40 mg once daily  # HTN, controlled and low -Patient was having low blood pressures after hospital discharge in 11/2022. Stop amlodipine.  # Valvular heart disease # Mild to moderate MR, 1/24 echo # Moderate to severe aortic valve stenosis, 1/24 echo -Obtain 2D echocardiogram in 6 months prior to next clinic visit  # Fusiform infrarenal  abdominal aortic aneurysm, 4.8 cm (per 11/03/2022 CT imaging) -Vascular surgery referral -Continue aspirin 81 mg once daily -Blood pressure goal ideally should be less than 120/80 mmHg however patient has been running low blood pressures, 90s SBP since discharge from the hospital. Held her amlodipine.  # Chronic hypoxic respiratory failure secondary to COPD -Increase oxygen on nasal cannula and pulmonology referral  I have spent a total of 33 minutes with patient reviewing chart, EKGs, labs and examining patient as well as establishing an assessment and plan that was discussed with the patient.  > 50% of time was spent in direct patient care.     Medication Adjustments/Labs and Tests Ordered: Current medicines are reviewed at length with the patient today.  Concerns regarding medicines are outlined above.   Tests Ordered: No orders of the defined types were placed in this encounter.   Medication Changes: No orders of the defined types were placed in this encounter.   Disposition:  Follow up  6 months  Signed, Chrystian Ressler Verne Spurr, MD, 11/20/2022 2:30 PM    [facility] at [facility] [address] [place], [place] [address]

## 2022-11-20 NOTE — Patient Instructions (Signed)
Medication Instructions:  Your physician has recommended you make the following change in your medication:   Stop Taking Amlodipine ( Norvasc)  Monitor blood pressure, if blood pressure starts to trend over 150/90 restart Amlodipine ( Norvasc) and notify office.   *If you need a refill on your cardiac medications before your next appointment, please call your pharmacy*   Lab Work: NONE   If you have labs (blood work) drawn today and your tests are completely normal, you will receive your results only by: MyChart Message (if you have MyChart) OR A paper copy in the mail If you have any lab test that is abnormal or we need to change your treatment, we will call you to review the results.   Testing/Procedures: NONE    Follow-Up: At [facility], you and your health needs are our priority.  As part of our continuing mission to provide you with exceptional heart care, we have created designated Provider Care Teams.  These Care Teams include your primary Cardiologist (physician) and Advanced Practice Providers (APPs -  Physician Assistants and Nurse Practitioners) who all work together to provide you with the care you need, when you need it.  We recommend signing up for the patient portal called "MyChart".  Sign up information is provided on this After Visit Summary.  MyChart is used to connect with patients for Virtual Visits (Telemedicine).  Patients are able to view lab/test results, encounter notes, upcoming appointments, etc.  Non-urgent messages can be sent to your provider as well.   To learn more about what you can do with MyChart, go to [email or web address].    Your next appointment:   6 month(s)  Provider:   You may see Vishnu P Mallipeddi, MD or one of the following Advanced Practice Providers on your designated Care Team:   Randall An, PA-C  Jacolyn Reedy, PA-C     Other Instructions Thank you for choosing [facility]!

## 2022-11-23 DIAGNOSIS — I5033 Acute on chronic diastolic (congestive) heart failure: Secondary | ICD-10-CM | POA: Diagnosis not present

## 2022-11-23 DIAGNOSIS — D509 Iron deficiency anemia, unspecified: Secondary | ICD-10-CM | POA: Diagnosis not present

## 2022-11-23 DIAGNOSIS — Z85110 Personal history of malignant carcinoid tumor of bronchus and lung: Secondary | ICD-10-CM | POA: Diagnosis not present

## 2022-11-23 DIAGNOSIS — J9621 Acute and chronic respiratory failure with hypoxia: Secondary | ICD-10-CM | POA: Diagnosis not present

## 2022-11-23 DIAGNOSIS — R627 Adult failure to thrive: Secondary | ICD-10-CM | POA: Diagnosis not present

## 2022-11-23 DIAGNOSIS — E039 Hypothyroidism, unspecified: Secondary | ICD-10-CM | POA: Diagnosis not present

## 2022-11-23 DIAGNOSIS — R809 Proteinuria, unspecified: Secondary | ICD-10-CM | POA: Diagnosis not present

## 2022-11-23 DIAGNOSIS — D469 Myelodysplastic syndrome, unspecified: Secondary | ICD-10-CM | POA: Diagnosis not present

## 2022-11-23 DIAGNOSIS — E875 Hyperkalemia: Secondary | ICD-10-CM | POA: Diagnosis not present

## 2022-11-23 DIAGNOSIS — N1832 Chronic kidney disease, stage 3b: Secondary | ICD-10-CM | POA: Diagnosis not present

## 2022-11-23 DIAGNOSIS — I4891 Unspecified atrial fibrillation: Secondary | ICD-10-CM | POA: Diagnosis not present

## 2022-11-23 DIAGNOSIS — I130 Hypertensive heart and chronic kidney disease with heart failure and stage 1 through stage 4 chronic kidney disease, or unspecified chronic kidney disease: Secondary | ICD-10-CM | POA: Diagnosis not present

## 2022-11-25 DIAGNOSIS — N1832 Chronic kidney disease, stage 3b: Secondary | ICD-10-CM | POA: Diagnosis not present

## 2022-11-25 DIAGNOSIS — J9621 Acute and chronic respiratory failure with hypoxia: Secondary | ICD-10-CM | POA: Diagnosis not present

## 2022-11-25 DIAGNOSIS — I5033 Acute on chronic diastolic (congestive) heart failure: Secondary | ICD-10-CM | POA: Diagnosis not present

## 2022-11-25 DIAGNOSIS — I130 Hypertensive heart and chronic kidney disease with heart failure and stage 1 through stage 4 chronic kidney disease, or unspecified chronic kidney disease: Secondary | ICD-10-CM | POA: Diagnosis not present

## 2022-11-25 DIAGNOSIS — J439 Emphysema, unspecified: Secondary | ICD-10-CM | POA: Diagnosis not present

## 2022-11-25 DIAGNOSIS — J441 Chronic obstructive pulmonary disease with (acute) exacerbation: Secondary | ICD-10-CM | POA: Diagnosis not present

## 2022-11-27 DIAGNOSIS — J439 Emphysema, unspecified: Secondary | ICD-10-CM | POA: Diagnosis not present

## 2022-11-27 DIAGNOSIS — I130 Hypertensive heart and chronic kidney disease with heart failure and stage 1 through stage 4 chronic kidney disease, or unspecified chronic kidney disease: Secondary | ICD-10-CM | POA: Diagnosis not present

## 2022-11-27 DIAGNOSIS — J9621 Acute and chronic respiratory failure with hypoxia: Secondary | ICD-10-CM | POA: Diagnosis not present

## 2022-11-27 DIAGNOSIS — N1832 Chronic kidney disease, stage 3b: Secondary | ICD-10-CM | POA: Diagnosis not present

## 2022-11-27 DIAGNOSIS — J441 Chronic obstructive pulmonary disease with (acute) exacerbation: Secondary | ICD-10-CM | POA: Diagnosis not present

## 2022-11-27 DIAGNOSIS — I5033 Acute on chronic diastolic (congestive) heart failure: Secondary | ICD-10-CM | POA: Diagnosis not present

## 2022-11-30 ENCOUNTER — Ambulatory Visit: Payer: Medicare Other | Admitting: Internal Medicine

## 2022-12-01 DIAGNOSIS — J9621 Acute and chronic respiratory failure with hypoxia: Secondary | ICD-10-CM | POA: Diagnosis not present

## 2022-12-01 DIAGNOSIS — I5033 Acute on chronic diastolic (congestive) heart failure: Secondary | ICD-10-CM | POA: Diagnosis not present

## 2022-12-01 DIAGNOSIS — I130 Hypertensive heart and chronic kidney disease with heart failure and stage 1 through stage 4 chronic kidney disease, or unspecified chronic kidney disease: Secondary | ICD-10-CM | POA: Diagnosis not present

## 2022-12-01 DIAGNOSIS — N1832 Chronic kidney disease, stage 3b: Secondary | ICD-10-CM | POA: Diagnosis not present

## 2022-12-01 DIAGNOSIS — J439 Emphysema, unspecified: Secondary | ICD-10-CM | POA: Diagnosis not present

## 2022-12-01 DIAGNOSIS — J441 Chronic obstructive pulmonary disease with (acute) exacerbation: Secondary | ICD-10-CM | POA: Diagnosis not present

## 2022-12-03 DIAGNOSIS — N1832 Chronic kidney disease, stage 3b: Secondary | ICD-10-CM | POA: Diagnosis not present

## 2022-12-03 DIAGNOSIS — J439 Emphysema, unspecified: Secondary | ICD-10-CM | POA: Diagnosis not present

## 2022-12-03 DIAGNOSIS — J9621 Acute and chronic respiratory failure with hypoxia: Secondary | ICD-10-CM | POA: Diagnosis not present

## 2022-12-03 DIAGNOSIS — J441 Chronic obstructive pulmonary disease with (acute) exacerbation: Secondary | ICD-10-CM | POA: Diagnosis not present

## 2022-12-03 DIAGNOSIS — I5033 Acute on chronic diastolic (congestive) heart failure: Secondary | ICD-10-CM | POA: Diagnosis not present

## 2022-12-03 DIAGNOSIS — I130 Hypertensive heart and chronic kidney disease with heart failure and stage 1 through stage 4 chronic kidney disease, or unspecified chronic kidney disease: Secondary | ICD-10-CM | POA: Diagnosis not present

## 2022-12-08 ENCOUNTER — Telehealth: Payer: Self-pay | Admitting: Internal Medicine

## 2022-12-08 DIAGNOSIS — R627 Adult failure to thrive: Secondary | ICD-10-CM | POA: Diagnosis not present

## 2022-12-08 DIAGNOSIS — I130 Hypertensive heart and chronic kidney disease with heart failure and stage 1 through stage 4 chronic kidney disease, or unspecified chronic kidney disease: Secondary | ICD-10-CM | POA: Diagnosis not present

## 2022-12-08 DIAGNOSIS — R197 Diarrhea, unspecified: Secondary | ICD-10-CM | POA: Diagnosis not present

## 2022-12-08 DIAGNOSIS — I4891 Unspecified atrial fibrillation: Secondary | ICD-10-CM | POA: Diagnosis not present

## 2022-12-08 DIAGNOSIS — D469 Myelodysplastic syndrome, unspecified: Secondary | ICD-10-CM | POA: Diagnosis not present

## 2022-12-08 DIAGNOSIS — J9621 Acute and chronic respiratory failure with hypoxia: Secondary | ICD-10-CM | POA: Diagnosis not present

## 2022-12-08 DIAGNOSIS — I5033 Acute on chronic diastolic (congestive) heart failure: Secondary | ICD-10-CM | POA: Diagnosis not present

## 2022-12-08 DIAGNOSIS — I10 Essential (primary) hypertension: Secondary | ICD-10-CM | POA: Diagnosis not present

## 2022-12-08 DIAGNOSIS — G609 Hereditary and idiopathic neuropathy, unspecified: Secondary | ICD-10-CM | POA: Diagnosis not present

## 2022-12-08 DIAGNOSIS — N1832 Chronic kidney disease, stage 3b: Secondary | ICD-10-CM | POA: Diagnosis not present

## 2022-12-08 DIAGNOSIS — Z8673 Personal history of transient ischemic attack (TIA), and cerebral infarction without residual deficits: Secondary | ICD-10-CM | POA: Diagnosis not present

## 2022-12-08 DIAGNOSIS — J441 Chronic obstructive pulmonary disease with (acute) exacerbation: Secondary | ICD-10-CM | POA: Diagnosis not present

## 2022-12-08 DIAGNOSIS — Z853 Personal history of malignant neoplasm of breast: Secondary | ICD-10-CM | POA: Diagnosis not present

## 2022-12-08 DIAGNOSIS — Z23 Encounter for immunization: Secondary | ICD-10-CM | POA: Diagnosis not present

## 2022-12-08 DIAGNOSIS — J439 Emphysema, unspecified: Secondary | ICD-10-CM | POA: Diagnosis not present

## 2022-12-08 DIAGNOSIS — R809 Proteinuria, unspecified: Secondary | ICD-10-CM | POA: Diagnosis not present

## 2022-12-08 DIAGNOSIS — E039 Hypothyroidism, unspecified: Secondary | ICD-10-CM | POA: Diagnosis not present

## 2022-12-08 MED ORDER — METOPROLOL SUCCINATE ER 25 MG PO TB24: 25.0000 mg | ORAL_TABLET | Freq: Every day | ORAL | 0 refills | Status: DC

## 2022-12-08 NOTE — Telephone Encounter (Signed)
*  STAT* If patient is at the pharmacy, call can be transferred to refill team.   1. Which medications need to be refilled? (please list name of each medication and dose if known)   metoprolol succinate (TOPROL XL) 25 MG 24 hr tablet   2. Which pharmacy/location (including street and city if local pharmacy) is medication to be sent to?  [place], [address]   3. Do they need a 30 day or 90 day supply?    Patient stated she is completely out of this medication and she will need a supply of about 12 tablets sent to [place] until her prescription is received from [facility].

## 2022-12-08 NOTE — Telephone Encounter (Signed)
Completed.

## 2022-12-09 ENCOUNTER — Encounter: Payer: Medicare Other | Admitting: Vascular Surgery

## 2022-12-12 DIAGNOSIS — E039 Hypothyroidism, unspecified: Secondary | ICD-10-CM | POA: Diagnosis not present

## 2022-12-12 DIAGNOSIS — N1832 Chronic kidney disease, stage 3b: Secondary | ICD-10-CM | POA: Diagnosis not present

## 2022-12-12 DIAGNOSIS — Z7952 Long term (current) use of systemic steroids: Secondary | ICD-10-CM | POA: Diagnosis not present

## 2022-12-12 DIAGNOSIS — J441 Chronic obstructive pulmonary disease with (acute) exacerbation: Secondary | ICD-10-CM | POA: Diagnosis not present

## 2022-12-12 DIAGNOSIS — D469 Myelodysplastic syndrome, unspecified: Secondary | ICD-10-CM | POA: Diagnosis not present

## 2022-12-12 DIAGNOSIS — I5033 Acute on chronic diastolic (congestive) heart failure: Secondary | ICD-10-CM | POA: Diagnosis not present

## 2022-12-12 DIAGNOSIS — Z7982 Long term (current) use of aspirin: Secondary | ICD-10-CM | POA: Diagnosis not present

## 2022-12-12 DIAGNOSIS — I700 Atherosclerosis of aorta: Secondary | ICD-10-CM | POA: Diagnosis not present

## 2022-12-12 DIAGNOSIS — Z9181 History of falling: Secondary | ICD-10-CM | POA: Diagnosis not present

## 2022-12-12 DIAGNOSIS — M1990 Unspecified osteoarthritis, unspecified site: Secondary | ICD-10-CM | POA: Diagnosis not present

## 2022-12-12 DIAGNOSIS — Z9012 Acquired absence of left breast and nipple: Secondary | ICD-10-CM | POA: Diagnosis not present

## 2022-12-12 DIAGNOSIS — Z85118 Personal history of other malignant neoplasm of bronchus and lung: Secondary | ICD-10-CM | POA: Diagnosis not present

## 2022-12-12 DIAGNOSIS — J439 Emphysema, unspecified: Secondary | ICD-10-CM | POA: Diagnosis not present

## 2022-12-12 DIAGNOSIS — I088 Other rheumatic multiple valve diseases: Secondary | ICD-10-CM | POA: Diagnosis not present

## 2022-12-12 DIAGNOSIS — I714 Abdominal aortic aneurysm, without rupture, unspecified: Secondary | ICD-10-CM | POA: Diagnosis not present

## 2022-12-12 DIAGNOSIS — J9621 Acute and chronic respiratory failure with hypoxia: Secondary | ICD-10-CM | POA: Diagnosis not present

## 2022-12-12 DIAGNOSIS — I130 Hypertensive heart and chronic kidney disease with heart failure and stage 1 through stage 4 chronic kidney disease, or unspecified chronic kidney disease: Secondary | ICD-10-CM | POA: Diagnosis not present

## 2022-12-12 DIAGNOSIS — Z87891 Personal history of nicotine dependence: Secondary | ICD-10-CM | POA: Diagnosis not present

## 2022-12-12 DIAGNOSIS — E875 Hyperkalemia: Secondary | ICD-10-CM | POA: Diagnosis not present

## 2022-12-12 DIAGNOSIS — Z853 Personal history of malignant neoplasm of breast: Secondary | ICD-10-CM | POA: Diagnosis not present

## 2022-12-14 DIAGNOSIS — I5033 Acute on chronic diastolic (congestive) heart failure: Secondary | ICD-10-CM | POA: Diagnosis not present

## 2022-12-14 DIAGNOSIS — J439 Emphysema, unspecified: Secondary | ICD-10-CM | POA: Diagnosis not present

## 2022-12-14 DIAGNOSIS — J441 Chronic obstructive pulmonary disease with (acute) exacerbation: Secondary | ICD-10-CM | POA: Diagnosis not present

## 2022-12-14 DIAGNOSIS — J9621 Acute and chronic respiratory failure with hypoxia: Secondary | ICD-10-CM | POA: Diagnosis not present

## 2022-12-14 DIAGNOSIS — I130 Hypertensive heart and chronic kidney disease with heart failure and stage 1 through stage 4 chronic kidney disease, or unspecified chronic kidney disease: Secondary | ICD-10-CM | POA: Diagnosis not present

## 2022-12-14 DIAGNOSIS — N1832 Chronic kidney disease, stage 3b: Secondary | ICD-10-CM | POA: Diagnosis not present

## 2022-12-16 ENCOUNTER — Ambulatory Visit (INDEPENDENT_AMBULATORY_CARE_PROVIDER_SITE_OTHER): Payer: Medicare Other | Admitting: Vascular Surgery

## 2022-12-16 ENCOUNTER — Encounter: Payer: Self-pay | Admitting: Vascular Surgery

## 2022-12-16 VITALS — BP 134/57 | HR 63 | Temp 97.9°F | Ht 65.0 in | Wt 146.6 lb

## 2022-12-16 DIAGNOSIS — I7143 Infrarenal abdominal aortic aneurysm, without rupture: Secondary | ICD-10-CM | POA: Diagnosis not present

## 2022-12-16 NOTE — Progress Notes (Signed)
[facility]in [facility]ist of [place]  Patient name: Lisa [name] MRN: [id number] DOB: March 01, 1934 Sex: female  REASON FOR CONSULT: Discuss incidental finding of infrarenal abdominal aortic aneurysm  HPI: Lisa [name] is a 89 y.o. female, who is here today for discussion of incidental finding of abdominal aortic aneurysm.  She is here today with her daughter.  He underwent CT scan of her chest abdomen and pelvis on 11/03/2022 due to presentation with shortness of breath and diarrhea.  She had an incidental finding of infrarenal abdominal aortic aneurysm.  She is in failing health.  She is in a wheelchair today with her daughter.  Past Medical History:  Diagnosis Date   Adenocarcinoma of left lung ([place]) 2006   Arthritis    Asthma    Cancer of breast, female ([place])    Cancer of lung ([place])    Hypertension    Hypothyroidism    Invasive ductal carcinoma of left breast ([place]) 1999   Neutropenia ([place]) 06/09/2016   Personal history of radiation therapy     Family History  Problem Relation Age of Onset   Cancer Mother    Cancer Sister    Colon cancer Neg Hx     SOCIAL HISTORY: Social History   Socioeconomic History   Marital status: Widowed    Spouse name: Not on file   Number of children: Not on file   Years of education: Not on file   Highest education level: Not on file  Occupational History   Not on file  Tobacco Use   Smoking status: Former    Packs/day: 0.75    Years: 35.00    Total pack years: 26.25    Types: Cigarettes    Quit date: 81    Years since quitting: 31.1   Smokeless tobacco: Never   Tobacco comments:    smoke-free X 30 yeras  Vaping Use   Vaping Use: Never used  Substance and Sexual Activity   Alcohol use: No   Drug use: No   Sexual activity: Not Currently  Other Topics Concern   Not on file  Social History Narrative   Not on file   Social Determinants of Health   Financial Resource Strain: Not on file   Food Insecurity: No Food Insecurity (11/03/2022)   Hunger Vital Sign    Worried About Running Out of Food in the Last Year: Never true    Ran Out of Food in the Last Year: Never true  Transportation Needs: No Transportation Needs (11/03/2022)   PRAPARE - Hydrologist (Medical): No    Lack of Transportation (Non-Medical): No  Physical Activity: Inactive (12/12/2020)   Exercise Vital Sign    Days of Exercise per Week: 0 days    Minutes of Exercise per Session: 0 min  Stress: Not on file  Social Connections: Not on file  Intimate Partner Violence: Not At Risk (11/03/2022)   Humiliation, Afraid, Rape, and Kick questionnaire    Fear of Current or Ex-Partner: No    Emotionally Abused: No    Physically Abused: No    Sexually Abused: No    Allergies  Allergen Reactions   Meloxicam Other (See Comments)    Caused an injury to the kidneys, per nephrologist   Micardis Hct [Telmisartan-Hctz] Other (See Comments)    Caused an injury to the kidneys, per nephrologist    Current Outpatient Medications  Medication Sig Dispense Refill   acetaminophen (TYLENOL) 325 MG tablet Take 2  tablets (650 mg total) by mouth every 6 (six) hours.     albuterol (VENTOLIN HFA) 108 (90 Base) MCG/ACT inhaler Inhale 1-2 puffs into the lungs every 4 (four) hours as needed for wheezing or shortness of breath.     amLODipine (NORVASC) 10 MG tablet Take 0.5 tablets (5 mg total) by mouth daily.     aspirin EC 81 MG EC tablet Take 1 tablet (81 mg total) by mouth daily. Swallow whole. 30 tablet 11   furosemide (LASIX) 40 MG tablet Take 1 tablet (40 mg total) by mouth daily. 30 tablet 2   gabapentin (NEURONTIN) 100 MG capsule Take 1 capsule (100 mg total) by mouth every 12 (twelve) hours as needed (neuropathy). 60 capsule 1   levothyroxine (SYNTHROID) 100 MCG tablet Take 1 tablet (100 mcg total) by mouth daily before breakfast. 30 tablet 1   magnesium oxide (MAG-OX) 400 MG tablet Take 400 mg by mouth  2 (two) times daily.     metoprolol succinate (TOPROL XL) 25 MG 24 hr tablet Take 1 tablet (25 mg total) by mouth daily. 12 tablet 0   mirtazapine (REMERON) 7.5 MG tablet Take 1 tablet (7.5 mg total) by mouth at bedtime. 30 tablet 2   Multiple Vitamin (MULTI-VITAMIN) tablet Take 1 tablet by mouth daily.     pantoprazole (PROTONIX) 40 MG tablet Take 1 tablet (40 mg total) by mouth daily. 30 tablet 1   predniSONE (DELTASONE) 20 MG tablet Take 3 tablets by mouth daily x 1 day; then 2 tablet by mouth daily x 2 days; then 1 tablet by mouth daily x 3 days; then half tablet by mouth daily x 3 days and stop prednisone. 12 tablet 0   umeclidinium bromide (INCRUSE ELLIPTA) 62.5 MCG/INH AEPB Inhale 1 puff into the lungs daily.     Vitamin D, Ergocalciferol, (DRISDOL) 1.25 MG (50000 UNIT) CAPS capsule Take 50,000 Units by mouth once a week.     amoxicillin-clavulanate (AUGMENTIN) 500-125 MG tablet Take 1 tablet by mouth 3 (three) times daily. (Patient not taking: Reported on 12/16/2022)     No current facility-administered medications for this visit.    REVIEW OF SYSTEMS:  [X]  denotes positive finding, [ ]  denotes negative finding Cardiac  Comments:  Chest pain or chest pressure: x   Shortness of breath upon exertion: x   Short of breath when lying flat: x   Irregular heart rhythm: x       Vascular    Pain in calf, thigh, or hip brought on by ambulation:    Pain in feet at night that wakes you up from your sleep:  x   Blood clot in your veins:    Leg swelling:  x       Pulmonary    Oxygen at home: x   Productive cough:  x   Wheezing:         Neurologic    Sudden weakness in arms or legs:  x   Sudden numbness in arms or legs:     Sudden onset of difficulty speaking or slurred speech:    Temporary loss of vision in one eye:     Problems with dizziness:         Gastrointestinal    Blood in stool:     Vomited blood:         Genitourinary    Burning when urinating:     Blood in urine:         Psychiatric  Major depression:         Hematologic    Bleeding problems:    Problems with blood clotting too easily:        Skin    Rashes or ulcers:        Constitutional    Fever or chills: x     PHYSICAL EXAM: Vitals:   12/16/22 1134  BP: (!) 134/57  Pulse: 63  Temp: 97.9 F (36.6 C)  SpO2: (!) 89%  Weight: 146 lb 9.6 oz (66.5 kg)  Height: 5\' 5"  (1.651 m)    GENERAL: The patient is a well-nourished female, in no acute distress. The vital signs are documented above. CARDIOVASCULAR: 2+ radial pulses bilaterally.  2+ popliteal pulses bilaterally.  2+ dorsalis pedis pulses bilaterally.  Abdomen soft with no aneurysm palpable PULMONARY: There is good air exchange  MUSCULOSKELETAL: There are no major deformities or cyanosis. NEUROLOGIC: No focal weakness or paresthesias are detected. SKIN: There are no ulcers or rashes noted. PSYCHIATRIC: The patient has a normal affect.  DATA:  I reviewed her CT scan with the patient and her daughter.  She does have a 4.8 cm infrarenal abdominal aortic aneurysm.  Also has significant bilateral pleural effusions.  MEDICAL ISSUES: Discussed the risk of rupture of abdominal aortic aneurysm.  I explained that she is below the threshold where we would recommend consideration for repair.  I did explain the slight risk of rupture of her 4.8 cm aneurysm and the need to present immediately to the emergency room should this occur.  She is in very frail health.  Would reserve repair for significant growth.  We will see her again in 6 months with ultrasound follow-up of her aneurysm size   Rosetta Posner, MD [facility] [phone] Pager [phone]  Note: Portions of this report may have been transcribed using voice recognition software.  Every effort has been made to ensure accuracy; however, inadvertent computerized transcription errors may still be present.

## 2022-12-17 ENCOUNTER — Inpatient Hospital Stay: Payer: Medicare Other | Attending: Hematology | Admitting: Hematology

## 2022-12-17 ENCOUNTER — Inpatient Hospital Stay: Payer: Medicare Other

## 2022-12-17 VITALS — BP 123/51 | HR 67 | Temp 97.9°F | Resp 18 | Ht 65.0 in | Wt 145.6 lb

## 2022-12-17 DIAGNOSIS — D469 Myelodysplastic syndrome, unspecified: Secondary | ICD-10-CM | POA: Diagnosis not present

## 2022-12-17 DIAGNOSIS — D61818 Other pancytopenia: Secondary | ICD-10-CM | POA: Diagnosis not present

## 2022-12-17 DIAGNOSIS — D709 Neutropenia, unspecified: Secondary | ICD-10-CM | POA: Insufficient documentation

## 2022-12-17 DIAGNOSIS — Z9981 Dependence on supplemental oxygen: Secondary | ICD-10-CM | POA: Insufficient documentation

## 2022-12-17 DIAGNOSIS — D539 Nutritional anemia, unspecified: Secondary | ICD-10-CM

## 2022-12-17 DIAGNOSIS — Z888 Allergy status to other drugs, medicaments and biological substances status: Secondary | ICD-10-CM | POA: Insufficient documentation

## 2022-12-17 DIAGNOSIS — Z85118 Personal history of other malignant neoplasm of bronchus and lung: Secondary | ICD-10-CM | POA: Insufficient documentation

## 2022-12-17 DIAGNOSIS — Z8719 Personal history of other diseases of the digestive system: Secondary | ICD-10-CM | POA: Diagnosis not present

## 2022-12-17 DIAGNOSIS — Z809 Family history of malignant neoplasm, unspecified: Secondary | ICD-10-CM | POA: Insufficient documentation

## 2022-12-17 DIAGNOSIS — C9310 Chronic myelomonocytic leukemia not having achieved remission: Secondary | ICD-10-CM | POA: Diagnosis not present

## 2022-12-17 DIAGNOSIS — Z853 Personal history of malignant neoplasm of breast: Secondary | ICD-10-CM | POA: Insufficient documentation

## 2022-12-17 DIAGNOSIS — J4489 Other specified chronic obstructive pulmonary disease: Secondary | ICD-10-CM | POA: Insufficient documentation

## 2022-12-17 DIAGNOSIS — D696 Thrombocytopenia, unspecified: Secondary | ICD-10-CM | POA: Diagnosis not present

## 2022-12-17 DIAGNOSIS — Z79899 Other long term (current) drug therapy: Secondary | ICD-10-CM | POA: Diagnosis not present

## 2022-12-17 DIAGNOSIS — D4620 Refractory anemia with excess of blasts, unspecified: Secondary | ICD-10-CM | POA: Diagnosis not present

## 2022-12-17 LAB — CBC WITH DIFFERENTIAL/PLATELET
Abs Immature Granulocytes: 0 10*3/uL (ref 0.00–0.07)
Basophils Absolute: 0 10*3/uL (ref 0.0–0.1)
Basophils Relative: 0 %
Eosinophils Absolute: 0 10*3/uL (ref 0.0–0.5)
Eosinophils Relative: 1 %
HCT: 31.8 % — ABNORMAL LOW (ref 36.0–46.0)
Hemoglobin: 9.1 g/dL — ABNORMAL LOW (ref 12.0–15.0)
Lymphocytes Relative: 87 %
Lymphs Abs: 1.7 10*3/uL (ref 0.7–4.0)
MCH: 29.7 pg (ref 26.0–34.0)
MCHC: 28.6 g/dL — ABNORMAL LOW (ref 30.0–36.0)
MCV: 103.9 fL — ABNORMAL HIGH (ref 80.0–100.0)
Monocytes Absolute: 0.2 10*3/uL (ref 0.1–1.0)
Monocytes Relative: 9 %
Neutro Abs: 0.1 10*3/uL — CL (ref 1.7–7.7)
Neutrophils Relative %: 3 %
Platelets: 68 10*3/uL — ABNORMAL LOW (ref 150–400)
RBC: 3.06 MIL/uL — ABNORMAL LOW (ref 3.87–5.11)
RDW: 17.6 % — ABNORMAL HIGH (ref 11.5–15.5)
WBC Morphology: REACTIVE
WBC: 2 10*3/uL — ABNORMAL LOW (ref 4.0–10.5)
nRBC: 1.5 % — ABNORMAL HIGH (ref 0.0–0.2)

## 2022-12-17 LAB — IRON AND TIBC
Iron: 61 ug/dL (ref 28–170)
Saturation Ratios: 20 % (ref 10.4–31.8)
TIBC: 311 ug/dL (ref 250–450)
UIBC: 250 ug/dL

## 2022-12-17 LAB — FOLATE: Folate: 34.2 ng/mL (ref >5.9)

## 2022-12-17 LAB — VITAMIN B12: Vitamin B-12: 717 pg/mL (ref 180–914)

## 2022-12-17 LAB — FERRITIN: Ferritin: 341 ng/mL — ABNORMAL HIGH (ref 11–307)

## 2022-12-17 MED ORDER — DARBEPOETIN ALFA 200 MCG/0.4ML IJ SOSY
200.0000 ug | PREFILLED_SYRINGE | Freq: Once | INTRAMUSCULAR | Status: AC
  Administered 2022-12-17: 200 ug via SUBCUTANEOUS
  Filled 2022-12-17: qty 0.4

## 2022-12-17 NOTE — Progress Notes (Signed)
Patient has been assessed, vital signs and labs have been reviewed by Dr. Delton Coombes. ANC, Creatinine, LFTs, and Platelets are within treatment parameters per Dr. Delton Coombes. The patient is good to proceed with Arenesp treatment at this time.  Primary RN and pharmacy aware.

## 2022-12-17 NOTE — Progress Notes (Signed)
Lisa [name] presents today for injection per the provider's orders.  Aranesp 200 mcg administration without incident; injection site WNL; see [name] for injection details.  Patient tolerated procedure well and without incident.  No questions or complaints noted at this time. Pt's hemoglobin noted to be 9.1 today.  Discharged from clinic via wheelchair in stable condition. Alert and oriented x 3. F/U with [facility] as scheduled.

## 2022-12-17 NOTE — Patient Instructions (Signed)
[facility]  Discharge Instructions  You were seen and examined today by Dr. Delton Coombes.  Dr. Delton Coombes discussed your most recent lab work which revealed that everything looks good. We have not gotten the results of the ferritin and iron panel back yet.  Continue getting Arenesp every 4 weeks.  Follow-up as scheduled in 3 months.    Thank you for choosing [facility] to provide your oncology and hematology care.   To afford each patient quality time with our provider, please arrive at least 15 minutes before your scheduled appointment time. You may need to reschedule your appointment if you arrive late (10 or more minutes). Arriving late affects you and other patients whose appointments are after yours.  Also, if you miss three or more appointments without notifying the office, you may be dismissed from the clinic at the provider's discretion.    Again, thank you for choosing [facility].  Our hope is that these requests will decrease the amount of time that you wait before being seen by our physicians.   If you have a lab appointment with the [facility] please come in thru the Main Entrance and check in at the main information desk.           _____________________________________________________________  Should you have questions after your visit to [facility], please contact our office at [phone] and follow the prompts.  Our office hours are 8:00 a.m. to 4:30 p.m. Monday - Thursday and 8:00 a.m. to 2:30 p.m. Friday.  Please note that voicemails left after 4:00 p.m. may not be returned until the following business day.  We are closed weekends and all major holidays.  You do have access to a nurse 24-7, just call the main number to the clinic [phone] and do not press any options, hold on the line and a nurse will answer the phone.    For prescription refill requests, have your pharmacy contact  our office and allow 72 hours.    Masks are optional in the cancer centers. If you would like for your care team to wear a mask while they are taking care of you, please let them know. You may have one support person who is at least 87 years old accompany you for your appointments.

## 2022-12-17 NOTE — Progress Notes (Signed)
CRITICAL VALUE ALERT Critical value received:  ANC 0.1 Date of notification:  12-17-22 Time of notification: 1422 Critical value read back:  Yes.   Nurse who received alert:  C.Mattheus Rauls RN MD notified time and response:  [id number], Dr. Delton Coombes , no new orders at this time

## 2022-12-17 NOTE — Patient Instructions (Signed)
[facility]  Discharge Instructions: Thank you for choosing [facility] to provide your oncology and hematology care.  If you have a lab appointment with the [facility], please come in thru the Main Entrance and check in at the main information desk.  Wear comfortable clothing and clothing appropriate for easy access to any Portacath or PICC line.   We strive to give you quality time with your provider. You may need to reschedule your appointment if you arrive late (15 or more minutes).  Arriving late affects you and other patients whose appointments are after yours.  Also, if you miss three or more appointments without notifying the office, you may be dismissed from the clinic at the provider's discretion.      For prescription refill requests, have your pharmacy contact our office and allow 72 hours for refills to be completed.    Today you received Aranesp 200 mcg injection     BELOW ARE SYMPTOMS THAT SHOULD BE REPORTED IMMEDIATELY: *FEVER GREATER THAN 100.4 F (38 C) OR HIGHER *CHILLS OR SWEATING *NAUSEA AND VOMITING THAT IS NOT CONTROLLED WITH YOUR NAUSEA MEDICATION *UNUSUAL SHORTNESS OF BREATH *UNUSUAL BRUISING OR BLEEDING *URINARY PROBLEMS (pain or burning when urinating, or frequent urination) *BOWEL PROBLEMS (unusual diarrhea, constipation, pain near the anus) TENDERNESS IN MOUTH AND THROAT WITH OR WITHOUT PRESENCE OF ULCERS (sore throat, sores in mouth, or a toothache) UNUSUAL RASH, SWELLING OR PAIN  UNUSUAL VAGINAL DISCHARGE OR ITCHING   Items with * indicate a potential emergency and should be followed up as soon as possible or go to the Emergency Department if any problems should occur.  Please show the CHEMOTHERAPY ALERT CARD or IMMUNOTHERAPY ALERT CARD at check-in to the Emergency Department and triage nurse.  Should you have questions after your visit or need to cancel or reschedule your appointment, please contact [facility] [phone]  and follow the prompts.  Office hours are 8:00 a.m. to 4:30 p.m. Monday - Friday. Please note that voicemails left after 4:00 p.m. may not be returned until the following business day.  We are closed weekends and major holidays. You have access to a nurse at all times for urgent questions. Please call the main number to the clinic [phone] and follow the prompts.  For any non-urgent questions, you may also contact your provider using MyChart. We now offer e-Visits for anyone 80 and older to request care online for non-urgent symptoms. For details visit [email or web address].   Also download the MyChart app! Go to the app store, search "MyChart", open the app, select [facility], and log in with your MyChart username and password.

## 2022-12-17 NOTE — Progress Notes (Signed)
Lisa [name] [address] [place] [address]    Clinic Day:  12/17/2022  Referring physician: Celene Squibb, MD  Patient Care Team: Lisa Squibb, MD as PCP - General (Internal Medicine) Lisa Guest, MD as PCP - Cardiology (Cardiology) Lisa Romney Cristopher Estimable, MD as Consulting Physician (Gastroenterology) Lisa Lair, NP as [facility]   ASSESSMENT & PLAN:   Assessment: 1.    Low risk CMML/MDS: -Initially evaluated at the request of Lisa. [place] [name] for pancytopenia. Subsequently her white count has recovered along with recovery of the hemoglobin. -Bone marrow biopsy on 11/06/2020 shows hypercellular marrow with trilineage dyspoiesis and polytypic plasmacytosis.  Primary differential includes CMML and other MDS.  Definitive blast population is not identified morphologically or by flow.  There is a increased immature mononuclear cells favored to be monocytic in origin.  Megakaryocytes are frequently hypolobated and hyperchromatic without clustering.  Atypical appearing erythroid precursors.  Increased plasma cells 5 to 9% which are polytypic by kappa and lambda staining. - Chromosome analysis shows 46,X, idiocentric X chromosome[9]/46, XX[11]. -MDS FISH panel was negative. -Revised IPSS score of 3.  In the absence of treatment, median survival of 5.3 years and 25% AML progression is 10.8 years. -SPEP, methylmalonic acid, ferritin levels were normal. - Serum copper level is 87. - NGS (11/19/2020): SRSF2, TET2   2.  Stage I left breast [name]: -Diagnosed in Sep 1999, treated with lumpectomy and XRT. ER/PR was positive and received tamoxifen for 5 years. -Mammogram on 2019-10-30 was BI-RADS Category 2. Physical exam did not reveal any palpable masses today.   3.  Stage I adenocarcinoma of the left upper lobe of the lung: -Left upper lobectomy on 2005-06-01. CT chest on 2018-06-09 was stable. - CT CAP on 10/24/2020 shows stable postsurgical  changes from the partial left upper lobe resection.  No other signs of malignancy.  Plan: 1.   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  Tolerating well. - She had admission to the hospital 11/03/2022 through 11/07/2022 for pneumonia. - She is on oxygen 24/7.  Denies any bleeding per rectum or melena. - Overall breathing status is stable. - Reviewed labs today which showed hemoglobin 9.1.  Ferritin is 341 and percent saturation 20. - Leukopenia with severe neutropenia and moderate thrombocytopenia are stable. - Recommend continue Aranesp every 4 weeks.  RTC 3 months with repeat labs.  Orders Placed This Encounter  Procedures   Ferritin    Standing Status:   Future    Standing Expiration Date:   12/18/2023    Order Specific Question:   Release to patient    Answer:   Immediate   Iron and TIBC    Standing Status:   Future    Standing Expiration Date:   12/18/2023    Order Specific Question:   Release to patient    Answer:   Immediate   CBC with Differential/Platelet    Standing Status:   Future    Standing Expiration Date:   12/18/2023    Order Specific Question:   Release to patient    Answer:   Immediate   Lactate dehydrogenase    Standing Status:   Future    Standing Expiration Date:   12/18/2023    Order Specific Question:   Release to patient    Answer:   Immediate      Lisa [name] as a scribe for Lisa Jack, MD.,have documented all  relevant documentation on the behalf of Lisa Jack, MD,as directed by  Lisa Jack, MD while in the presence of Lisa Jack, MD.   I, Lisa Jack MD, have reviewed the above documentation for accuracy and completeness, and I agree with the above.   Doyce Loose   2/15/20242:35 PM  CHIEF COMPLAINT:   Diagnosis: MDS and left breast [name]    [name] Staging  Invasive ductal carcinoma of left breast [facility]) Staging form: Breast, AJCC 7th Edition - Pathologic  stage from 07/03/1998: Stage IA (T1a, N0, cM0) - Signed by Lisa Cancer, PA-C on 06/09/2016    Prior Therapy: none  Current Therapy:  Aransesp   HISTORY OF PRESENT ILLNESS:   Oncology History Overview Note  Called Lisa Lisa [name] office again now, office closed. Pt will see Lisa [name] and I will ask him if he can talk to Lisa Lisa [name] after his visit.  Thanks,   Adenocarcinoma of left lung ([place])  06/23/2016 Imaging   CT chest- Stable exam.  No new or progressive findings. Previously described tiny perifissural nodules at the base of the right major fissure are stable and likely represent subpleural lymph nodes.      INTERVAL HISTORY:   Lisa [name] is a 89 y.o. female presenting to clinic today for follow up of MDS and left breast [name] . She was last seen by me on 08/27/2022.  She was in the ED on 11/03/2022 for COPD and Pneumonia. Before her hospitalization she became extremely week. She was not able to walk. Her oxygen level was dropping.   Today, she states that she is doing well overall. Her appetite level is at 80%. Her energy level is at 60%. She is here today with her daughter. She has 3/10 arthritis pain.She denies any blood in urine but occasionally have nose bleeds. She denies recent infections beside the infections on 11/03/2022. Her daughter states that she occasionally has palpitations.   PAST MEDICAL HISTORY:   Past Medical History: Past Medical History:  Diagnosis Date   Adenocarcinoma of left lung ([place]) 2006   Arthritis    Asthma    [name] of breast, female ([place])    [name] of lung ([place])    Hypertension    Hypothyroidism    Invasive ductal carcinoma of left breast ([place]) 1999   Neutropenia ([place]) 06/09/2016   Personal history of radiation therapy     Surgical History: Past Surgical History:  Procedure Laterality Date   BIOPSY  10/23/2020   Procedure: BIOPSY;  Surgeon: Yetta Flock, MD;  Location: [facility];  Service: Gastroenterology;;   BREAST BIOPSY      left axillary node dissection   BREAST LUMPECTOMY Left    COLONOSCOPY  03/08/2003   WUJ:[id number] rectal polyps destroyed with the tip of the snare/Polyps at hepatic flexure, splenic flexure at 22 cm/Left-sided diverticula: unable to retrieve path   COLONOSCOPY  09/12/2008   FAO:ZHYQMV rectum and distal sigmoid diminutive polyps/scattered left sided diverticulum. hyperplastic   COLONOSCOPY N/A 03/06/2013   HQI:ONGEXBM polyp-removed as described above; colonic diverticulosis. hyperplastic polyps. next TCS 03/2018   ESOPHAGOGASTRODUODENOSCOPY (EGD) WITH PROPOFOL N/A 10/23/2020   Procedure: ESOPHAGOGASTRODUODENOSCOPY (EGD) WITH PROPOFOL;  Surgeon: Yetta Flock, MD;  Location: [facility];  Service: Gastroenterology;  Laterality: N/A;   FOOT SURGERY     LUNG REMOVAL, PARTIAL     upper lobe    Social History: Social History   Socioeconomic History   Marital status: Widowed    Spouse name: Not on file  Number of children: Not on file   Years of education: Not on file   Highest education level: Not on file  Occupational History   Not on file  Tobacco Use   Smoking status: Former    Packs/day: 0.75    Years: 35.00    Total pack years: 26.25    Types: Cigarettes    Quit date: 63    Years since quitting: 31.1   Smokeless tobacco: Never   Tobacco comments:    smoke-free X 30 yeras  Vaping Use   Vaping Use: Never used  Substance and Sexual Activity   Alcohol use: No   Drug use: No   Sexual activity: Not Currently  Other Topics Concern   Not on file  Social History Narrative   Not on file   Social Determinants of Health   Financial Resource Strain: Not on file  Food Insecurity: No Food Insecurity (11/03/2022)   Hunger Vital Sign    Worried About Running Out of Food in the Last Year: Never true    Ran Out of Food in the Last Year: Never true  Transportation Needs: No Transportation Needs (11/03/2022)   PRAPARE - Hydrologist (Medical): No     Lack of Transportation (Non-Medical): No  Physical Activity: Inactive (12/12/2020)   Exercise Vital Sign    Days of Exercise per Week: 0 days    Minutes of Exercise per Session: 0 min  Stress: Not on file  Social Connections: Not on file  Intimate Partner Violence: Not At Risk (11/03/2022)   Humiliation, Afraid, Rape, and Kick questionnaire    Fear of Current or Ex-Partner: No    Emotionally Abused: No    Physically Abused: No    Sexually Abused: No    Family History: Family History  Problem Relation Age of Onset   [name] Mother    [name] Sister    Colon [name] Neg Hx     Current Medications:  Current Outpatient Medications:    acetaminophen (TYLENOL) 325 MG tablet, Take 2 tablets (650 mg total) by mouth every 6 (six) hours., Disp: , Rfl:    albuterol (VENTOLIN HFA) 108 (90 Base) MCG/ACT inhaler, Inhale 1-2 puffs into the lungs every 4 (four) hours as needed for wheezing or shortness of breath., Disp: , Rfl:    amLODipine (NORVASC) 10 MG tablet, Take 0.5 tablets (5 mg total) by mouth daily., Disp: , Rfl:    amoxicillin-clavulanate (AUGMENTIN) 500-125 MG tablet, Take 1 tablet by mouth 3 (three) times daily., Disp: , Rfl:    aspirin EC 81 MG EC tablet, Take 1 tablet (81 mg total) by mouth daily. Swallow whole., Disp: 30 tablet, Rfl: 11   furosemide (LASIX) 40 MG tablet, Take 1 tablet (40 mg total) by mouth daily., Disp: 30 tablet, Rfl: 2   gabapentin (NEURONTIN) 100 MG capsule, Take 1 capsule (100 mg total) by mouth every 12 (twelve) hours as needed (neuropathy)., Disp: 60 capsule, Rfl: 1   levothyroxine (SYNTHROID) 100 MCG tablet, Take 1 tablet (100 mcg total) by mouth daily before breakfast., Disp: 30 tablet, Rfl: 1   magnesium oxide (MAG-OX) 400 MG tablet, Take 400 mg by mouth 2 (two) times daily., Disp: , Rfl:    metoprolol succinate (TOPROL XL) 25 MG 24 hr tablet, Take 1 tablet (25 mg total) by mouth daily., Disp: 12 tablet, Rfl: 0   mirtazapine (REMERON) 7.5 MG tablet, Take 1  tablet (7.5 mg total) by mouth at bedtime., Disp: 30  tablet, Rfl: 2   Multiple Vitamin (MULTI-VITAMIN) tablet, Take 1 tablet by mouth daily., Disp: , Rfl:    pantoprazole (PROTONIX) 40 MG tablet, Take 1 tablet (40 mg total) by mouth daily., Disp: 30 tablet, Rfl: 1   predniSONE (DELTASONE) 20 MG tablet, Take 3 tablets by mouth daily x 1 day; then 2 tablet by mouth daily x 2 days; then 1 tablet by mouth daily x 3 days; then half tablet by mouth daily x 3 days and stop prednisone., Disp: 12 tablet, Rfl: 0   umeclidinium bromide (INCRUSE ELLIPTA) 62.5 MCG/INH AEPB, Inhale 1 puff into the lungs daily., Disp: , Rfl:    Vitamin D, Ergocalciferol, (DRISDOL) 1.25 MG (50000 UNIT) CAPS capsule, Take 50,000 Units by mouth once a week., Disp: , Rfl:    Allergies: Allergies  Allergen Reactions   Meloxicam Other (See Comments)    Caused an injury to the kidneys, per nephrologist   Micardis Hct [Telmisartan-Hctz] Other (See Comments)    Caused an injury to the kidneys, per nephrologist    REVIEW OF SYSTEMS:   Review of Systems  Constitutional:  Negative for chills, fatigue and fever.  HENT:   Negative for lump/mass, mouth sores, nosebleeds, sore throat and trouble swallowing.   Eyes:  Negative for eye problems.  Respiratory:  Positive for cough and shortness of breath (2L of O2).   Cardiovascular:  Negative for chest pain, leg swelling and palpitations.  Gastrointestinal:  Negative for abdominal pain, constipation, diarrhea, nausea and vomiting.  Genitourinary:  Positive for frequency. Negative for bladder incontinence, difficulty urinating, dysuria, hematuria and nocturia.   Musculoskeletal:  Negative for arthralgias, back pain, flank pain, myalgias and neck pain.  Skin:  Negative for itching and rash.  Neurological:  Positive for numbness (Hands). Negative for dizziness and headaches.  Hematological:  Does not bruise/bleed easily.  Psychiatric/Behavioral:  Negative for depression, sleep disturbance  and suicidal ideas. The patient is not nervous/anxious.   All other systems reviewed and are negative.    VITALS:   Blood pressure (!) 123/51, pulse 67, temperature 97.9 F (36.6 C), temperature source Oral, resp. rate 18, height 5\' 5"  (1.651 m), weight 145 lb 9.6 oz (66 kg), SpO2 95 %.  Wt Readings from Last 3 Encounters:  12/17/22 145 lb 9.6 oz (66 kg)  12/16/22 146 lb 9.6 oz (66.5 kg)  11/20/22 138 lb (62.6 kg)    Body mass index is 24.23 kg/m.  Performance status (ECOG): 1 - Symptomatic but completely ambulatory  PHYSICAL EXAM:   Physical Exam Vitals and nursing note reviewed. Exam conducted with a chaperone present.  Constitutional:      Appearance: Normal appearance.  Cardiovascular:     Rate and Rhythm: Normal rate and regular rhythm.     Pulses: Normal pulses.     Heart sounds: Murmur heard.     Systolic murmur is present.     Comments: Systolic murmur Pulmonary:     Effort: Pulmonary effort is normal.     Breath sounds: Normal breath sounds.  Abdominal:     Palpations: Abdomen is soft. There is no hepatomegaly, splenomegaly or mass.     Tenderness: There is no abdominal tenderness.  Musculoskeletal:     Right lower leg: No edema.     Left lower leg: No edema.  Lymphadenopathy:     Cervical: No cervical adenopathy.     Right cervical: No superficial, deep or posterior cervical adenopathy.    Left cervical: No superficial, deep or posterior cervical  adenopathy.     Upper Body:     Right upper body: No supraclavicular or axillary adenopathy.     Left upper body: No supraclavicular or axillary adenopathy.  Neurological:     General: No focal deficit present.     Mental Status: She is alert and oriented to person, place, and time.  Psychiatric:        Mood and Affect: Mood normal.        Behavior: Behavior normal.     LABS:      Latest Ref Rng & Units 12/17/2022    1:31 PM 11/19/2022   11:21 AM 11/03/2022    7:44 AM  CBC  WBC 4.0 - 10.5 K/uL 2.0  8.5   4.4   Hemoglobin 12.0 - 15.0 g/dL 9.1  10.1  9.7   Hematocrit 36.0 - 46.0 % 31.8  35.5  35.6   Platelets 150 - 400 K/uL 68  82  73       Latest Ref Rng & Units 11/07/2022    5:38 AM 11/06/2022    3:23 AM 11/05/2022    5:07 AM  CMP  Glucose 70 - 99 mg/dL 125  97  132   BUN 8 - 23 mg/dL 35  36  35   Creatinine 0.44 - 1.00 mg/dL 1.25  1.51  1.34   Sodium 135 - 145 mmol/L 134  134  128   Potassium 3.5 - 5.1 mmol/L 4.6  4.5  4.9   Chloride 98 - 111 mmol/L 101  103  100   CO2 22 - 32 mmol/L 25  25  21    Calcium 8.9 - 10.3 mg/dL 9.2  8.8  8.3      No results found for: "CEA1", "CEA" / No results found for: "CEA1", "CEA" No results found for: "PSA1" No results found for: "CAN199" No results found for: "CAN125"  Lab Results  Component Value Date   TOTALPROTELP 8.3 11/19/2020   ALBUMINELP 3.3 11/19/2020   A1GS 0.3 11/19/2020   A2GS 0.7 11/19/2020   BETS 1.3 11/19/2020   GAMS 2.6 (H) 11/19/2020   MSPIKE Not Observed 11/19/2020   SPEI Comment 11/19/2020   Lab Results  Component Value Date   TIBC 301 08/27/2022   TIBC 292 04/09/2022   TIBC 293 01/15/2022   FERRITIN 307 08/27/2022   FERRITIN 272 04/09/2022   FERRITIN 277 01/15/2022   IRONPCTSAT 15 08/27/2022   IRONPCTSAT 14 04/09/2022   IRONPCTSAT 14 01/15/2022   Lab Results  Component Value Date   LDH 314 (H) 11/03/2022   LDH 257 (H) 04/09/2022   LDH 263 (H) 08/22/2021     STUDIES:   No results found.

## 2022-12-18 ENCOUNTER — Ambulatory Visit (INDEPENDENT_AMBULATORY_CARE_PROVIDER_SITE_OTHER): Payer: Medicare Other | Admitting: Pulmonary Disease

## 2022-12-18 ENCOUNTER — Encounter (HOSPITAL_BASED_OUTPATIENT_CLINIC_OR_DEPARTMENT_OTHER): Payer: Self-pay | Admitting: Pulmonary Disease

## 2022-12-18 VITALS — BP 118/58 | HR 60 | Ht 67.0 in | Wt 140.0 lb

## 2022-12-18 DIAGNOSIS — J9611 Chronic respiratory failure with hypoxia: Secondary | ICD-10-CM

## 2022-12-18 DIAGNOSIS — J432 Centrilobular emphysema: Secondary | ICD-10-CM | POA: Diagnosis not present

## 2022-12-18 MED ORDER — TRELEGY ELLIPTA 100-62.5-25 MCG/ACT IN AEPB: 1.0000 | INHALATION_SPRAY | Freq: Every day | RESPIRATORY_TRACT | 0 refills | Status: DC

## 2022-12-18 NOTE — Progress Notes (Signed)
Subjective:    Patient ID: Lisa [name], female    DOB: Jan 06, 1934, 89 y.o.   MRN: [id number]  HPI 89 year old remote smoker presents to establish care after recent hospitalization for COPD and chronic respiratory failure -COPD on home oxygen 2L  -Left upper lobectomy 05/2005 for Stage I adenocarcinoma   PMH -  paroxysmal Afib on ASA Stage 1 breast CA - Sep 1999, treated with lumpectomy and XRT. ER/PR was positive and received tamoxifen for 5 years.  -MDS/ CMML -4.8 cm infra renal AAA -HFpEF, mod AS  She was hospitalized for 4 days in 11/2022 for shortness of breath and hypoxia requiring up to 15 L, BNP was elevated and CT chest confirmed pulmonary edema.  She was treated with diuretics, antibiotics and steroids and by discharge was needing 3 L oxygen.  She was found to have an incidental infrarenal AAA on imaging and has seen vascular.  She is also established with cardiology, echo showed normal EF.  She had elevated troponin She arrives in a wheelchair accompanied by her daughter [name] today. She reports baseline NYHA class III symptoms.  She only was using nocturnal oxygen prior to admission but is now using it 24/7.  She arrives without oxygen today and oxygen saturation is 92%.  She reports shortness of breath on routine activities such as walking around the house. For some reason she has only been maintained on Incruse for many years.  She denies wheezing, pedal edema. She reports 3 pillow orthopnea  I have reviewed cardiology, vascular and oncology consultation  On ambulation, oxygen saturation dropped to 90% and recovered with resting  Significant tests/ events reviewed  Echo 11/2022 >> moderate AS, AVA 0.8, grade 3 diastolic dysfunction, RVSP 51  CTA chest/A/P 11/2022 >> moderate centrilobular and paraseptal emphysema  Cardiomegaly with bilateral pleural effusions, patchy ground-glass opacities and periportal hepatic edema suspicious for congestive heart failure. Moderate  free pelvic fluid  PFTs 06/2017 show moderate airway obstruction with ratio 55, FEV1 47%, FVC 67 percent.  There was 14% improvement to 54% postbronchodilator, DLCO decreased to 7.10/25%, TLC was increased at 129%  Past Medical History:  Diagnosis Date   Adenocarcinoma of left lung ([place]) 2006   Arthritis    Asthma    Cancer of breast, female ([place])    Cancer of lung ([place])    Hypertension    Hypothyroidism    Invasive ductal carcinoma of left breast ([place]) 1999   Neutropenia ([place]) 06/09/2016   Personal history of radiation therapy    Past Surgical History:  Procedure Laterality Date   BIOPSY  10/23/2020   Procedure: BIOPSY;  Surgeon: Yetta Flock, MD;  Location: [facility];  Service: Gastroenterology;;   BREAST BIOPSY     left axillary node dissection   BREAST LUMPECTOMY Left    COLONOSCOPY  03/08/2003   TIW:PYKDXIPJAS rectal polyps destroyed with the tip of the snare/Polyps at hepatic flexure, splenic flexure at 53 cm/Left-sided diverticula: unable to retrieve path   COLONOSCOPY  09/12/2008   NKN:LZJQBH rectum and distal sigmoid diminutive polyps/scattered left sided diverticulum. hyperplastic   COLONOSCOPY N/A 03/06/2013   ALP:FXTKWIO polyp-removed as described above; colonic diverticulosis. hyperplastic polyps. next TCS 03/2018   ESOPHAGOGASTRODUODENOSCOPY (EGD) WITH PROPOFOL N/A 10/23/2020   Procedure: ESOPHAGOGASTRODUODENOSCOPY (EGD) WITH PROPOFOL;  Surgeon: Yetta Flock, MD;  Location: [facility];  Service: Gastroenterology;  Laterality: N/A;   FOOT SURGERY     LUNG REMOVAL, PARTIAL     upper lobe    .all  Social  History   Socioeconomic History   Marital status: Widowed    Spouse name: Not on file   Number of children: Not on file   Years of education: Not on file   Highest education level: Not on file  Occupational History   Not on file  Tobacco Use   Smoking status: Former    Packs/day: 0.75    Years: 35.00    Total pack years: 26.25    Types:  Cigarettes    Quit date: 64    Years since quitting: 31.1   Smokeless tobacco: Never   Tobacco comments:    smoke-free X 30 yeras  Vaping Use   Vaping Use: Never used  Substance and Sexual Activity   Alcohol use: No   Drug use: No   Sexual activity: Not Currently  Other Topics Concern   Not on file  Social History Narrative   Not on file   Social Determinants of Health   Financial Resource Strain: Not on file  Food Insecurity: No Food Insecurity (11/03/2022)   Hunger Vital Sign    Worried About Running Out of Food in the Last Year: Never true    Ran Out of Food in the Last Year: Never true  Transportation Needs: No Transportation Needs (11/03/2022)   PRAPARE - Hydrologist (Medical): No    Lack of Transportation (Non-Medical): No  Physical Activity: Inactive (12/12/2020)   Exercise Vital Sign    Days of Exercise per Week: 0 days    Minutes of Exercise per Session: 0 min  Stress: Not on file  Social Connections: Not on file  Intimate Partner Violence: Not At Risk (11/03/2022)   Humiliation, Afraid, Rape, and Kick questionnaire    Fear of Current or Ex-Partner: No    Emotionally Abused: No    Physically Abused: No    Sexually Abused: No    Family History  Problem Relation Age of Onset   Cancer Mother    Cancer Sister    Colon cancer Neg Hx      Review of Systems Shortness of breath with activity, nonproductive cough Irregular heartbeat Acid heartburn Loss of appetite Weight loss Depression Joint stiffness    Objective:   Physical Exam   Gen. Pleasant, well-nourished, elderly, in wheelchair in no distress, normal affect ENT - no pallor,icterus, no post nasal drip, hard of hearing Neck: No JVD, no thyromegaly, no carotid bruits Lungs: no use of accessory muscles, no dullness to percussion, decreased bilateral without rales or rhonchi  Cardiovascular: Rhythm regular, heart sounds  normal, no murmurs or gallops, no peripheral  edema Abdomen: soft and non-tender, no hepatosplenomegaly, BS normal. Musculoskeletal: No deformities, no cyanosis or clubbing Neuro:  alert, non focal        Assessment & Plan:

## 2022-12-18 NOTE — Assessment & Plan Note (Signed)
She was only on nocturnal oxygen previously and is now using 24/7.  Will try to obtain POC for her.

## 2022-12-18 NOTE — Patient Instructions (Signed)
x sample of Trelegy 100 -1 puff daily, rinse mouth after use.  Take instead of Incruse.  Call us for prescription of this works   X ambulatory saturation walk for POC prescription

## 2022-12-18 NOTE — Assessment & Plan Note (Signed)
She has at least moderate emphysema, she had a good response to bronchodilator perhaps suggesting a component of asthma.  She also had hyperinflation on PFTs. I doubt that she will be able to perform PFTs again. We will step up to Trelegy instead of Incruse, sample was provided today and she will call back for prescription if this works Her recent exacerbation is resolved

## 2022-12-22 DIAGNOSIS — I130 Hypertensive heart and chronic kidney disease with heart failure and stage 1 through stage 4 chronic kidney disease, or unspecified chronic kidney disease: Secondary | ICD-10-CM | POA: Diagnosis not present

## 2022-12-22 DIAGNOSIS — N1832 Chronic kidney disease, stage 3b: Secondary | ICD-10-CM | POA: Diagnosis not present

## 2022-12-22 DIAGNOSIS — J439 Emphysema, unspecified: Secondary | ICD-10-CM | POA: Diagnosis not present

## 2022-12-22 DIAGNOSIS — J9621 Acute and chronic respiratory failure with hypoxia: Secondary | ICD-10-CM | POA: Diagnosis not present

## 2022-12-22 DIAGNOSIS — J441 Chronic obstructive pulmonary disease with (acute) exacerbation: Secondary | ICD-10-CM | POA: Diagnosis not present

## 2022-12-22 DIAGNOSIS — I5033 Acute on chronic diastolic (congestive) heart failure: Secondary | ICD-10-CM | POA: Diagnosis not present

## 2022-12-23 ENCOUNTER — Ambulatory Visit (HOSPITAL_COMMUNITY): Admission: RE | Admit: 2022-12-23 | Payer: Medicare Other | Source: Ambulatory Visit

## 2022-12-30 DIAGNOSIS — J439 Emphysema, unspecified: Secondary | ICD-10-CM | POA: Diagnosis not present

## 2022-12-30 DIAGNOSIS — J441 Chronic obstructive pulmonary disease with (acute) exacerbation: Secondary | ICD-10-CM | POA: Diagnosis not present

## 2022-12-30 DIAGNOSIS — I5033 Acute on chronic diastolic (congestive) heart failure: Secondary | ICD-10-CM | POA: Diagnosis not present

## 2022-12-30 DIAGNOSIS — N1832 Chronic kidney disease, stage 3b: Secondary | ICD-10-CM | POA: Diagnosis not present

## 2022-12-30 DIAGNOSIS — I130 Hypertensive heart and chronic kidney disease with heart failure and stage 1 through stage 4 chronic kidney disease, or unspecified chronic kidney disease: Secondary | ICD-10-CM | POA: Diagnosis not present

## 2022-12-30 DIAGNOSIS — J9621 Acute and chronic respiratory failure with hypoxia: Secondary | ICD-10-CM | POA: Diagnosis not present

## 2023-01-06 ENCOUNTER — Other Ambulatory Visit: Payer: Self-pay

## 2023-01-06 ENCOUNTER — Telehealth: Payer: Self-pay | Admitting: Internal Medicine

## 2023-01-06 MED ORDER — FUROSEMIDE 40 MG PO TABS: 40.0000 mg | ORAL_TABLET | Freq: Every day | ORAL | 2 refills | Status: DC

## 2023-01-06 MED ORDER — FUROSEMIDE 40 MG PO TABS: 40.0000 mg | ORAL_TABLET | Freq: Every day | ORAL | 0 refills | Status: DC

## 2023-01-06 NOTE — Telephone Encounter (Signed)
*  STAT* If patient is at the pharmacy, call can be transferred to refill team.   1. Which medications need to be refilled? (please list name of each medication and dose if known)   furosemide (LASIX) 40 MG tablet  2. Which pharmacy/location (including street and city if local pharmacy) is medication to be sent to?  [place], [address]   3. Do they need a 30 day or 90 day supply?   30 day    Daughter states patient is completely out of this medication.  Daughter wants a call back to confirm patient still needs to be taking this medication.    Daughter states if patient still needs to take this medication they will need a 90 day supply sent to [facility].

## 2023-01-06 NOTE — Telephone Encounter (Signed)
Spoke to daughter- verbalized that pt is still currently supposed to be taking medication. 30 day refill sent to [facility], 90 day prescription sent to [facility].  Daughter thankful for call.

## 2023-01-13 ENCOUNTER — Ambulatory Visit: Payer: Self-pay | Admitting: *Deleted

## 2023-01-14 ENCOUNTER — Inpatient Hospital Stay: Payer: Medicare Other | Attending: Hematology

## 2023-01-14 ENCOUNTER — Inpatient Hospital Stay: Payer: Medicare Other

## 2023-01-14 VITALS — BP 96/69 | HR 73 | Temp 98.1°F | Resp 16

## 2023-01-14 DIAGNOSIS — D539 Nutritional anemia, unspecified: Secondary | ICD-10-CM

## 2023-01-14 DIAGNOSIS — C9310 Chronic myelomonocytic leukemia not having achieved remission: Secondary | ICD-10-CM | POA: Diagnosis not present

## 2023-01-14 DIAGNOSIS — Z79899 Other long term (current) drug therapy: Secondary | ICD-10-CM | POA: Diagnosis not present

## 2023-01-14 DIAGNOSIS — D469 Myelodysplastic syndrome, unspecified: Secondary | ICD-10-CM

## 2023-01-14 DIAGNOSIS — D4620 Refractory anemia with excess of blasts, unspecified: Secondary | ICD-10-CM | POA: Diagnosis not present

## 2023-01-14 LAB — CBC
HCT: 32.1 % — ABNORMAL LOW (ref 36.0–46.0)
Hemoglobin: 9.2 g/dL — ABNORMAL LOW (ref 12.0–15.0)
MCH: 30 pg (ref 26.0–34.0)
MCHC: 28.7 g/dL — ABNORMAL LOW (ref 30.0–36.0)
MCV: 104.6 fL — ABNORMAL HIGH (ref 80.0–100.0)
Platelets: 58 10*3/uL — ABNORMAL LOW (ref 150–400)
RBC: 3.07 MIL/uL — ABNORMAL LOW (ref 3.87–5.11)
RDW: 17.8 % — ABNORMAL HIGH (ref 11.5–15.5)
WBC: 2.5 10*3/uL — ABNORMAL LOW (ref 4.0–10.5)
nRBC: 5.9 % — ABNORMAL HIGH (ref 0.0–0.2)

## 2023-01-14 MED ORDER — DARBEPOETIN ALFA 200 MCG/0.4ML IJ SOSY
200.0000 ug | PREFILLED_SYRINGE | Freq: Once | INTRAMUSCULAR | Status: AC
  Administered 2023-01-14: 200 ug via SUBCUTANEOUS
  Filled 2023-01-14: qty 0.4

## 2023-01-14 NOTE — Patient Instructions (Signed)
[facility]  Discharge Instructions: Thank you for choosing [facility] to provide your oncology and hematology care.  If you have a lab appointment with the [facility], please come in thru the Main Entrance and check in at the main information desk.  Wear comfortable clothing and clothing appropriate for easy access to any Portacath or PICC line.   We strive to give you quality time with your provider. You may need to reschedule your appointment if you arrive late (15 or more minutes).  Arriving late affects you and other patients whose appointments are after yours.  Also, if you miss three or more appointments without notifying the office, you may be dismissed from the clinic at the provider's discretion.      For prescription refill requests, have your pharmacy contact our office and allow 72 hours for refills to be completed.    Today you received the following Aranesp.    To help prevent nausea and vomiting after your treatment, we encourage you to take your nausea medication as directed.  BELOW ARE SYMPTOMS THAT SHOULD BE REPORTED IMMEDIATELY: *FEVER GREATER THAN 100.4 F (38 C) OR HIGHER *CHILLS OR SWEATING *NAUSEA AND VOMITING THAT IS NOT CONTROLLED WITH YOUR NAUSEA MEDICATION *UNUSUAL SHORTNESS OF BREATH *UNUSUAL BRUISING OR BLEEDING *URINARY PROBLEMS (pain or burning when urinating, or frequent urination) *BOWEL PROBLEMS (unusual diarrhea, constipation, pain near the anus) TENDERNESS IN MOUTH AND THROAT WITH OR WITHOUT PRESENCE OF ULCERS (sore throat, sores in mouth, or a toothache) UNUSUAL RASH, SWELLING OR PAIN  UNUSUAL VAGINAL DISCHARGE OR ITCHING   Items with * indicate a potential emergency and should be followed up as soon as possible or go to the Emergency Department if any problems should occur.  Please show the CHEMOTHERAPY ALERT CARD or IMMUNOTHERAPY ALERT CARD at check-in to the Emergency Department and triage nurse.  Should  you have questions after your visit or need to cancel or reschedule your appointment, please contact [facility] [phone]  and follow the prompts.  Office hours are 8:00 a.m. to 4:30 p.m. Monday - Friday. Please note that voicemails left after 4:00 p.m. may not be returned until the following business day.  We are closed weekends and major holidays. You have access to a nurse at all times for urgent questions. Please call the main number to the clinic [phone] and follow the prompts.  For any non-urgent questions, you may also contact your provider using MyChart. We now offer e-Visits for anyone 70 and older to request care online for non-urgent symptoms. For details visit [email or web address].   Also download the MyChart app! Go to the app store, search "MyChart", open the app, select [facility], and log in with your MyChart username and password.

## 2023-01-14 NOTE — Patient Outreach (Signed)
  Care Coordination   Initial Visit Note   01/14/2023 Name: Lisa [name] MRN: [id number] DOB: 1934-10-05  Lisa [name] is a 89 y.o. year old female who sees Nevada Crane, Edwinna Areola, MD for primary care. I spoke with  Dr. Karrie Meres, son of Enedina Pair by phone today. This was brief and he advised he would send me the number I could reach his mother.  What matters to the patients health and wellness today?      Goals Addressed   None     SDOH assessments and interventions completed:  No     Care Coordination Interventions:  No, not indicated   Follow up plan: Follow up call scheduled for 01/14/23    Encounter Outcome:  Pt. Request to Call Back   Lancer Thurner C. Myrtie Neither, MSN, [facility] Gerontological Nurse Practitioner [facility] Care Management [phone]

## 2023-01-14 NOTE — Progress Notes (Signed)
Patient arrived today for Aranesp injection. Hgb reviewed prior to injection. Hgb 9.2. Injection given SQ in left arm, tolerated well. Patient vital signs stable. Patient left in satisfactory condition with no questions.

## 2023-01-15 ENCOUNTER — Ambulatory Visit: Payer: Self-pay | Admitting: *Deleted

## 2023-01-15 ENCOUNTER — Telehealth: Payer: Self-pay | Admitting: Pulmonary Disease

## 2023-01-15 NOTE — Patient Outreach (Signed)
  Care Coordination   Initial Visit Note Follow up post hospitalization Jan 2024 for HF   01/15/2023 Name: Lisa [name] MRN: [id number] DOB: 1934/05/13  Lisa [name] is a 89 y.o. year old female who sees Nevada Crane, Edwinna Areola, MD for primary care. I spoke with [name], daughter and primary caregiver for Lisa [name] today.  What matters to the patients health and wellness today?  Getting oxygen tubing and POC for my mother. Continued management of chronic illnesses to prevent hospitalization.   Goals Addressed             This Visit's Progress    COMPLETED: Daughter/caregiver verbalizes knowledge of Heart Failure disease self management skills by completion of today's call.       Interventions Today    Flowsheet Row Most Recent Value  Chronic Disease   Chronic disease during today's visit Congestive Heart Failure (CHF)  General Interventions   General Interventions Discussed/Reviewed General Interventions Discussed, General Interventions Reviewed  [Daughter is familiar with HF Action Plan and she ensures that her mother and she follow these guidelines. (daily wt, meds, exercise, call for increased wt, SOB, edema for instruction)]  Exercise Interventions   Exercise Discussed/Reviewed Physical Activity  Physical Activity Discussed/Reviewed Physical Activity Reviewed  Education Interventions   Education Provided Provided Education  Provided Verbal Education On Exercise, Medication, Nutrition  Nutrition Interventions   Nutrition Discussed/Reviewed Decreasing salt  Pharmacy Interventions   Pharmacy Dicussed/Reviewed Medication Adherence  Safety Interventions   Safety Discussed/Reviewed Safety Discussed  Advanced Directive Interventions   Advanced Directives Discussed/Reviewed Advanced Directives Discussed, Provided resource for acquiring and filling out documents              SDOH assessments and interventions completed:  Yes  SDOH Interventions Today    Flowsheet  Row Most Recent Value  SDOH Interventions   Food Insecurity Interventions Intervention Not Indicated  Housing Interventions Intervention Not Indicated  Transportation Interventions Intervention Not Indicated  Utilities Interventions Intervention Not Indicated        Care Coordination Interventions:  Yes, provided   1)Called [employer] for O2 tubing order. 2)Called Dr. Elsworth Soho for POC order progress 3)Called Dr. Juel Burrow office to notify pt needs Potassium Chloride tablets, vitamin D level and recommendation for OTC if Rx is not indicated.  Follow up plan: No further intervention required.   Encounter Outcome:  Pt. Visit Completed   Kayleen Memos C. Myrtie Neither, MSN, [facility] Gerontological Nurse Practitioner [facility] Care Management [phone]

## 2023-01-15 NOTE — Telephone Encounter (Signed)
Pt needs a walk test per NP [place]

## 2023-01-20 NOTE — Telephone Encounter (Signed)
Pt was walked at last visit with Dr. Elsworth Soho 12/18/22 and the walk is able to be seen during that visit.   Attempted to call Arbie Cookey but unable to reach. Left message for her to return call.

## 2023-01-22 DIAGNOSIS — R809 Proteinuria, unspecified: Secondary | ICD-10-CM | POA: Diagnosis not present

## 2023-01-22 DIAGNOSIS — E875 Hyperkalemia: Secondary | ICD-10-CM | POA: Diagnosis not present

## 2023-01-22 DIAGNOSIS — I509 Heart failure, unspecified: Secondary | ICD-10-CM | POA: Diagnosis not present

## 2023-01-22 DIAGNOSIS — D469 Myelodysplastic syndrome, unspecified: Secondary | ICD-10-CM | POA: Diagnosis not present

## 2023-01-22 DIAGNOSIS — Z85110 Personal history of malignant carcinoid tumor of bronchus and lung: Secondary | ICD-10-CM | POA: Diagnosis not present

## 2023-01-22 DIAGNOSIS — I10 Essential (primary) hypertension: Secondary | ICD-10-CM | POA: Diagnosis not present

## 2023-01-22 DIAGNOSIS — D509 Iron deficiency anemia, unspecified: Secondary | ICD-10-CM | POA: Diagnosis not present

## 2023-01-22 DIAGNOSIS — E039 Hypothyroidism, unspecified: Secondary | ICD-10-CM | POA: Diagnosis not present

## 2023-01-22 DIAGNOSIS — J9621 Acute and chronic respiratory failure with hypoxia: Secondary | ICD-10-CM | POA: Diagnosis not present

## 2023-01-22 DIAGNOSIS — N189 Chronic kidney disease, unspecified: Secondary | ICD-10-CM | POA: Diagnosis not present

## 2023-01-22 DIAGNOSIS — I4891 Unspecified atrial fibrillation: Secondary | ICD-10-CM | POA: Diagnosis not present

## 2023-01-22 DIAGNOSIS — I714 Abdominal aortic aneurysm, without rupture, unspecified: Secondary | ICD-10-CM | POA: Diagnosis not present

## 2023-01-22 DIAGNOSIS — I130 Hypertensive heart and chronic kidney disease with heart failure and stage 1 through stage 4 chronic kidney disease, or unspecified chronic kidney disease: Secondary | ICD-10-CM | POA: Diagnosis not present

## 2023-01-22 DIAGNOSIS — I7143 Infrarenal abdominal aortic aneurysm, without rupture: Secondary | ICD-10-CM | POA: Diagnosis not present

## 2023-01-24 LAB — LAB REPORT - SCANNED
Albumin, Urine POC: 10.2
Albumin/Creatinine Ratio, Urine, POC: 79
Creatinine, Random Urine: 12.9
EGFR: 49

## 2023-02-01 ENCOUNTER — Ambulatory Visit: Payer: Self-pay | Admitting: *Deleted

## 2023-02-01 NOTE — Patient Outreach (Signed)
  Care Coordination   Follow Up Visit Note   02/01/2023 Name: Lisa [name] MRN: [id number] DOB: [name] 18, 1934  Lisa [name] is a 89 y.o. year old female who sees Lisa [name], Lisa Areola, MD for primary care. I spoke with Lisa [name] today, Lisa [name] daughter.  What matters to the patients health and wellness today?  Getting her O2 tubing and POC.    Goals Addressed             This Visit's Progress    Follow HF Action Plan to avoid complications and hospitalizations.          SDOH assessments and interventions completed:  Yes    Previously assessed.  Care Coordination Interventions:  Yes, provided   Follow up plan: Follow up call scheduled for 2 weeks.    Encounter Outcome:  Pt. Visit Completed   Lisa [name] C. Myrtie Neither, MSN, [facility] Gerontological Nurse Practitioner [facility] Care Management [phone]

## 2023-02-11 ENCOUNTER — Inpatient Hospital Stay: Payer: Medicare Other

## 2023-02-12 ENCOUNTER — Ambulatory Visit: Payer: Self-pay | Admitting: *Deleted

## 2023-02-12 ENCOUNTER — Inpatient Hospital Stay: Payer: Medicare Other | Attending: Hematology

## 2023-02-12 ENCOUNTER — Inpatient Hospital Stay: Payer: Medicare Other

## 2023-02-12 VITALS — BP 140/66 | HR 65 | Temp 96.3°F | Resp 18

## 2023-02-12 DIAGNOSIS — C9310 Chronic myelomonocytic leukemia not having achieved remission: Secondary | ICD-10-CM | POA: Diagnosis not present

## 2023-02-12 DIAGNOSIS — D4620 Refractory anemia with excess of blasts, unspecified: Secondary | ICD-10-CM | POA: Insufficient documentation

## 2023-02-12 DIAGNOSIS — Z79899 Other long term (current) drug therapy: Secondary | ICD-10-CM | POA: Diagnosis not present

## 2023-02-12 DIAGNOSIS — D469 Myelodysplastic syndrome, unspecified: Secondary | ICD-10-CM

## 2023-02-12 DIAGNOSIS — D539 Nutritional anemia, unspecified: Secondary | ICD-10-CM

## 2023-02-12 LAB — CBC WITH DIFFERENTIAL/PLATELET
Abs Immature Granulocytes: 0 10*3/uL (ref 0.00–0.07)
Band Neutrophils: 2 %
Basophils Absolute: 0 10*3/uL (ref 0.0–0.1)
Basophils Relative: 0 %
Eosinophils Absolute: 0 10*3/uL (ref 0.0–0.5)
Eosinophils Relative: 0 %
HCT: 33.4 % — ABNORMAL LOW (ref 36.0–46.0)
Hemoglobin: 9.7 g/dL — ABNORMAL LOW (ref 12.0–15.0)
Lymphocytes Relative: 69 %
Lymphs Abs: 1.6 10*3/uL (ref 0.7–4.0)
MCH: 30.1 pg (ref 26.0–34.0)
MCHC: 29 g/dL — ABNORMAL LOW (ref 30.0–36.0)
MCV: 103.7 fL — ABNORMAL HIGH (ref 80.0–100.0)
Metamyelocytes Relative: 1 %
Monocytes Absolute: 0.5 10*3/uL (ref 0.1–1.0)
Monocytes Relative: 23 %
Neutro Abs: 0.2 10*3/uL — CL (ref 1.7–7.7)
Neutrophils Relative %: 5 %
Platelets: 50 10*3/uL — ABNORMAL LOW (ref 150–400)
RBC: 3.22 MIL/uL — ABNORMAL LOW (ref 3.87–5.11)
RDW: 18.1 % — ABNORMAL HIGH (ref 11.5–15.5)
WBC: 2.3 10*3/uL — ABNORMAL LOW (ref 4.0–10.5)
nRBC: 6.2 % — ABNORMAL HIGH (ref 0.0–0.2)

## 2023-02-12 LAB — COMPREHENSIVE METABOLIC PANEL
ALT: 10 U/L (ref 0–44)
AST: 25 U/L (ref 15–41)
Albumin: 3.7 g/dL (ref 3.5–5.0)
Alkaline Phosphatase: 47 U/L (ref 38–126)
Anion gap: 9 (ref 5–15)
BUN: 39 mg/dL — ABNORMAL HIGH (ref 8–23)
CO2: 27 mmol/L (ref 22–32)
Calcium: 8.9 mg/dL (ref 8.9–10.3)
Chloride: 98 mmol/L (ref 98–111)
Creatinine, Ser: 1.19 mg/dL — ABNORMAL HIGH (ref 0.44–1.00)
GFR, Estimated: 45 mL/min — ABNORMAL LOW (ref >60)
Glucose, Bld: 96 mg/dL (ref 70–99)
Potassium: 5.3 mmol/L — ABNORMAL HIGH (ref 3.5–5.1)
Sodium: 134 mmol/L — ABNORMAL LOW (ref 135–145)
Total Bilirubin: 0.8 mg/dL (ref 0.3–1.2)
Total Protein: 9.4 g/dL — ABNORMAL HIGH (ref 6.5–8.1)

## 2023-02-12 LAB — LACTATE DEHYDROGENASE: LDH: 264 U/L — ABNORMAL HIGH (ref 98–192)

## 2023-02-12 LAB — IRON AND TIBC
Iron: 57 ug/dL (ref 28–170)
Saturation Ratios: 18 % (ref 10.4–31.8)
TIBC: 316 ug/dL (ref 250–450)
UIBC: 259 ug/dL

## 2023-02-12 LAB — FERRITIN: Ferritin: 360 ng/mL — ABNORMAL HIGH (ref 11–307)

## 2023-02-12 MED ORDER — DARBEPOETIN ALFA 200 MCG/0.4ML IJ SOSY
200.0000 ug | PREFILLED_SYRINGE | Freq: Once | INTRAMUSCULAR | Status: AC
  Administered 2023-02-12: 200 ug via SUBCUTANEOUS
  Filled 2023-02-12: qty 0.4

## 2023-02-12 NOTE — Progress Notes (Signed)
CRITICAL VALUE ALERT Critical value received:  ANC 0.2 Date of notification:  02-12-23 Time of notification: 0930 Critical value read back:  Yes.   Nurse who received alert:  C. Kanai Berrios RN MD notified time and response:  [phone] Dr. [name] , no new orders

## 2023-02-12 NOTE — Patient Outreach (Signed)
  Care Coordination   Follow Up Visit Note   02/15/23 Name: Lisa [name] MRN: [id number] DOB: [birth year]  Lisa [name] is a 89 y.o. year old female who sees Margo Aye, Kathleene Hazel, MD for primary care. I spoke pt grandson (MD) of Lisa [name] by phone today.  What matters to the patients health and wellness today?  Ensuring her potassium level has come down into normal range.    Goals Addressed             This Visit's Progress    COMPLETED: Follow HF Action Plan to avoid complications and hospitalizations.       Interventions Today    Flowsheet Row Most Recent Value  Chronic Disease   Chronic disease during today's visit Congestive Heart Failure (CHF)  General Interventions   General Interventions Discussed/Reviewed General Interventions Discussed, General Interventions Reviewed  [Following HF Action Plan. Status stable. Last K level was H 5.3, was given reduction med. Repeat level scheduled in May. Talked with Lisa [name]'s nurse to request level be done in 2 weeks at the end of April and reported to grandson, MD.]              SDOH assessments and interventions completed:  Yes    Previously assessed.  Care Coordination Interventions:  Yes, provided   Follow up plan: No further intervention required.   Encounter Outcome:  Pt. Visit Completed   Noralyn Pick C. Burgess Estelle, MSN, [facility] Gerontological Nurse Practitioner [facility] Care Management [phone]

## 2023-02-12 NOTE — Progress Notes (Signed)
Lisa [name] presents today for injection per the provider's orders.  Hgb  9.7.  Aranesp administration without incident; injection site WNL; see [name] for injection details.  Patient tolerated procedure well and without incident.  No questions or complaints noted at this time. Discharge from clinic via wheelchair in stable condition.  Alert and oriented X 3.  Follow up with [facility] as scheduled.

## 2023-02-12 NOTE — Patient Instructions (Signed)
[facility]  Discharge Instructions: Thank you for choosing [facility] to provide your oncology and hematology care.  If you have a lab appointment with the Cancer Center - please note that after April 8th, 2024, all labs will be drawn in the cancer center.  You do not have to check in or register with the main entrance as you have in the past but will complete your check-in in the cancer center.  Wear comfortable clothing and clothing appropriate for easy access to any Portacath or PICC line.   We strive to give you quality time with your provider. You may need to reschedule your appointment if you arrive late (15 or more minutes).  Arriving late affects you and other patients whose appointments are after yours.  Also, if you miss three or more appointments without notifying the office, you may be dismissed from the clinic at the provider's discretion.      For prescription refill requests, have your pharmacy contact our office and allow 72 hours for refills to be completed.    Today you received the following chemotherapy and/or immunotherapy agents Aranesp      To help prevent nausea and vomiting after your treatment, we encourage you to take your nausea medication as directed.  BELOW ARE SYMPTOMS THAT SHOULD BE REPORTED IMMEDIATELY: *FEVER GREATER THAN 100.4 F (38 C) OR HIGHER *CHILLS OR SWEATING *NAUSEA AND VOMITING THAT IS NOT CONTROLLED WITH YOUR NAUSEA MEDICATION *UNUSUAL SHORTNESS OF BREATH *UNUSUAL BRUISING OR BLEEDING *URINARY PROBLEMS (pain or burning when urinating, or frequent urination) *BOWEL PROBLEMS (unusual diarrhea, constipation, pain near the anus) TENDERNESS IN MOUTH AND THROAT WITH OR WITHOUT PRESENCE OF ULCERS (sore throat, sores in mouth, or a toothache) UNUSUAL RASH, SWELLING OR PAIN  UNUSUAL VAGINAL DISCHARGE OR ITCHING   Items with * indicate a potential emergency and should be followed up as soon as possible or go to the  Emergency Department if any problems should occur.  Please show the CHEMOTHERAPY ALERT CARD or IMMUNOTHERAPY ALERT CARD at check-in to the Emergency Department and triage nurse.  Should you have questions after your visit or need to cancel or reschedule your appointment, please contact [facility] [phone]  and follow the prompts.  Office hours are 8:00 a.m. to 4:30 p.m. Monday - Friday. Please note that voicemails left after 4:00 p.m. may not be returned until the following business day.  We are closed weekends and major holidays. You have access to a nurse at all times for urgent questions. Please call the main number to the clinic [phone] and follow the prompts.  For any non-urgent questions, you may also contact your provider using MyChart. We now offer e-Visits for anyone 40 and older to request care online for non-urgent symptoms. For details visit [email or web address].   Also download the MyChart app! Go to the app store, search "MyChart", open the app, select [facility], and log in with your MyChart username and password.

## 2023-02-18 ENCOUNTER — Other Ambulatory Visit: Payer: Self-pay | Admitting: Student

## 2023-03-11 DIAGNOSIS — B351 Tinea unguium: Secondary | ICD-10-CM | POA: Diagnosis not present

## 2023-03-11 DIAGNOSIS — G629 Polyneuropathy, unspecified: Secondary | ICD-10-CM | POA: Diagnosis not present

## 2023-03-12 ENCOUNTER — Other Ambulatory Visit: Payer: Self-pay

## 2023-03-12 DIAGNOSIS — D539 Nutritional anemia, unspecified: Secondary | ICD-10-CM

## 2023-03-12 DIAGNOSIS — D469 Myelodysplastic syndrome, unspecified: Secondary | ICD-10-CM

## 2023-03-15 ENCOUNTER — Inpatient Hospital Stay: Payer: Medicare Other | Admitting: Hematology

## 2023-03-15 ENCOUNTER — Inpatient Hospital Stay: Payer: Medicare Other

## 2023-03-31 NOTE — Progress Notes (Signed)
[facility] [address] [place] [address]    Clinic Day:  04/01/2023  Referring physician: [name], MD  Patient Care Team: [name], MD as PCP - General (Internal Medicine) Mallipeddi, Orion Modest, MD as PCP - Cardiology (Cardiology) Jena Gauss Gerrit Friends, MD as Consulting Physician (Gastroenterology)   ASSESSMENT & PLAN:   Assessment: 1.    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 Margo Aye for pancytopenia. Subsequently her white count has recovered along with recovery of the hemoglobin. -Bone marrow biopsy on 11/06/2020 shows hypercellular marrow with trilineage dyspoiesis and polytypic plasmacytosis.  Primary differential includes CMML and other MDS.  Definitive blast population is not identified morphologically or by flow.  There is a increased immature mononuclear cells favored to be monocytic in origin.  Megakaryocytes are frequently hypolobated and hyperchromatic without clustering.  Atypical appearing erythroid precursors.  Increased plasma cells 5 to 9% which are polytypic by kappa and lambda staining. - Chromosome analysis shows 46,X, idiocentric X chromosome[9]/46, XX[11]. -MDS FISH panel was negative. -Revised IPSS score of 3.  In the absence of treatment, median survival of 5.3 years and 25% AML progression is 10.8 years. -SPEP, methylmalonic acid, ferritin levels were normal. - Serum copper level is 87. - NGS (11/19/2020): SRSF2, TET2   2.  Stage I left breast cancer: -Diagnosed in Sep 1999, treated with lumpectomy and XRT. ER/PR was positive and received tamoxifen for 5 years. -Mammogram on 2019-10-30 was BI-RADS Category 2. Physical exam did not reveal any palpable masses today.   3.  Stage I adenocarcinoma of the left upper lobe of the lung: -Left upper lobectomy on 2005-06-01. CT chest on 2018-06-09 was stable. - CT CAP on 10/24/2020 shows stable postsurgical changes from the partial left upper lobe resection.  No other signs  of malignancy.    Plan: 1.   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 - Denies any bleeding per rectum.  However she reports occasional dark stools after recent diarrhea for the last 7 to 10 days. - Reviewed labs today: Ferritin was 533, percent saturation 19.  Hemoglobin is low at 8.3.  She was in the range of 9-10 for the past few months.  Platelet count also dropped to 44 and has been gradually decreasing.  LDH also increased to 287. - Based on these findings, I have recommended repeating bone marrow aspiration and biopsy with MDS FISH panel and cytogenetics. - Will send myeloid NGS panel and SPEP today. - She may receive Aranesp today.  I will see her back 2 weeks after the biopsy.  2.  Hyperkalemia: - She was started on Lokelma 3 months ago by Dr. Wolfgang Phoenix.  Potassium is 4.5.    Orders Placed This Encounter  Procedures   CT BONE MARROW BIOPSY & ASPIRATION    Cytogenetics; MDS FISH    Standing Status:   Future    Standing Expiration Date:   03/31/2024    Order Specific Question:   Reason for Exam (SYMPTOM  OR DIAGNOSIS REQUIRED)    Answer:   CMML/MDS    Order Specific Question:   Preferred location?    Answer:   [facility]   IntelliGEN Myeloid    Standing Status:   Future    Number of Occurrences:   1    Standing Expiration Date:   03/31/2024   Protein electrophoresis, serum    Standing Status:   Future  Number of Occurrences:   1    Standing Expiration Date:   03/31/2024      I,Katie Daubenspeck,acting as a scribe for Doreatha Massed, MD.,have documented all relevant documentation on the behalf of Doreatha Massed, MD,as directed by  Doreatha Massed, MD while in the presence of Doreatha Massed, MD.   I, Doreatha Massed MD, have reviewed the above documentation for accuracy and completeness, and I agree with the above.   Doreatha Massed, MD   5/30/20246:06 PM  CHIEF COMPLAINT:   Diagnosis: MDS   Cancer  Staging  Invasive ductal carcinoma of left breast [facility]) Staging form: Breast, AJCC 7th Edition - Pathologic stage from 07/03/1998: Stage IA (T1a, N0, cM0) - Signed by Ellouise Newer, PA-C on 06/09/2016    Prior Therapy: None  Current Therapy:  Aransesp    HISTORY OF PRESENT ILLNESS:   Oncology History Overview Note  Called Dr Scharlene Gloss office again now, office closed. Pt will see Dr [name] and I will ask him if he can talk to Dr Margo Aye after his visit.  Thanks,   Adenocarcinoma of left lung (HCC)  06/23/2016 Imaging   CT chest- Stable exam.  No new or progressive findings. Previously described tiny perifissural nodules at the base of the right major fissure are stable and likely represent subpleural lymph nodes.      INTERVAL HISTORY:   Dareth is a 89 y.o. female presenting to clinic today for follow up of MDS. She was last seen by me on 12/17/22.  Today, she states that she is doing well overall. Her appetite level is at 75%. Her energy level is at 75%.  PAST MEDICAL HISTORY:   Past Medical History: Past Medical History:  Diagnosis Date   Adenocarcinoma of left lung (HCC) 2006   Arthritis    Asthma    Cancer of breast, female (HCC)    Cancer of lung (HCC)    Hypertension    Hypothyroidism    Invasive ductal carcinoma of left breast (HCC) 1999   Neutropenia (HCC) 06/09/2016   Personal history of radiation therapy     Surgical History: Past Surgical History:  Procedure Laterality Date   BIOPSY  10/23/2020   Procedure: BIOPSY;  Surgeon: Benancio Deeds, MD;  Location: [facility] ENDOSCOPY;  Service: Gastroenterology;;   BREAST BIOPSY     left axillary node dissection   BREAST LUMPECTOMY Left    COLONOSCOPY  03/08/2003   NGE:XBMWUXLKGM rectal polyps destroyed with the tip of the snare/Polyps at hepatic flexure, splenic flexure at 35 cm/Left-sided diverticula: unable to retrieve path   COLONOSCOPY  09/12/2008   WNU:UVOZDG rectum and distal sigmoid diminutive  polyps/scattered left sided diverticulum. hyperplastic   COLONOSCOPY N/A 03/06/2013   UYQ:IHKVQQV polyp-removed as described above; colonic diverticulosis. hyperplastic polyps. next TCS 03/2018   ESOPHAGOGASTRODUODENOSCOPY (EGD) WITH PROPOFOL N/A 10/23/2020   Procedure: ESOPHAGOGASTRODUODENOSCOPY (EGD) WITH PROPOFOL;  Surgeon: Benancio Deeds, MD;  Location: [facility] ENDOSCOPY;  Service: Gastroenterology;  Laterality: N/A;   FOOT SURGERY     LUNG REMOVAL, PARTIAL     upper lobe    Social History: Social History   Socioeconomic History   Marital status: Widowed    Spouse name: Not on file   Number of children: Not on file   Years of education: Not on file   Highest education level: Not on file  Occupational History   Not on file  Tobacco Use   Smoking status: Former    Packs/day: 0.75    Years: 35.00  Additional pack years: 0.00    Total pack years: 26.25    Types: Cigarettes    Quit date: 6    Years since quitting: 31.4   Smokeless tobacco: Never   Tobacco comments:    smoke-free X 30 yeras  Vaping Use   Vaping Use: Never used  Substance and Sexual Activity   Alcohol use: No   Drug use: No   Sexual activity: Not Currently  Other Topics Concern   Not on file  Social History Narrative   Not on file   Social Determinants of Health   Financial Resource Strain: Not on file  Food Insecurity: No Food Insecurity (01/15/2023)   Hunger Vital Sign    Worried About Running Out of Food in the Last Year: Never true    Ran Out of Food in the Last Year: Never true  Transportation Needs: No Transportation Needs (01/15/2023)   PRAPARE - Administrator, Civil Service (Medical): No    Lack of Transportation (Non-Medical): No  Physical Activity: Inactive (12/12/2020)   Exercise Vital Sign    Days of Exercise per Week: 0 days    Minutes of Exercise per Session: 0 min  Stress: Not on file  Social Connections: Not on file  Intimate Partner Violence: Not At Risk  (01/15/2023)   Humiliation, Afraid, Rape, and Kick questionnaire    Fear of Current or Ex-Partner: No    Emotionally Abused: No    Physically Abused: No    Sexually Abused: No    Family History: Family History  Problem Relation Age of Onset   Cancer Mother    Cancer Sister    Colon cancer Neg Hx     Current Medications:  Current Outpatient Medications:    acetaminophen (TYLENOL) 325 MG tablet, Take 2 tablets (650 mg total) by mouth every 6 (six) hours., Disp: , Rfl:    albuterol (VENTOLIN HFA) 108 (90 Base) MCG/ACT inhaler, Inhale 1-2 puffs into the lungs every 4 (four) hours as needed for wheezing or shortness of breath., Disp: , Rfl:    aspirin EC 81 MG EC tablet, Take 1 tablet (81 mg total) by mouth daily. Swallow whole., Disp: 30 tablet, Rfl: 11   Fluticasone-Umeclidin-Vilant (TRELEGY ELLIPTA) 100-62.5-25 MCG/ACT AEPB, Inhale 1 puff into the lungs daily., Disp: 28 each, Rfl: 0   furosemide (LASIX) 40 MG tablet, Take 1 tablet (40 mg total) by mouth daily., Disp: 90 tablet, Rfl: 2   levothyroxine (SYNTHROID) 100 MCG tablet, Take 1 tablet (100 mcg total) by mouth daily before breakfast., Disp: 30 tablet, Rfl: 1   LOKELMA 10 g PACK packet, Take 10 g by mouth daily., Disp: , Rfl:    magnesium oxide (MAG-OX) 400 MG tablet, Take 400 mg by mouth 2 (two) times daily., Disp: , Rfl:    metoprolol succinate (TOPROL-XL) 25 MG 24 hr tablet, TAKE 1 TABLET DAILY, Disp: 90 tablet, Rfl: 1   mirtazapine (REMERON) 7.5 MG tablet, Take 1 tablet (7.5 mg total) by mouth at bedtime., Disp: 30 tablet, Rfl: 2   Multiple Vitamin (MULTI-VITAMIN) tablet, Take 1 tablet by mouth daily., Disp: , Rfl:    pantoprazole (PROTONIX) 40 MG tablet, Take 1 tablet (40 mg total) by mouth daily., Disp: 30 tablet, Rfl: 1   gabapentin (NEURONTIN) 100 MG capsule, Take 1 capsule (100 mg total) by mouth every 12 (twelve) hours as needed (neuropathy)., Disp: 60 capsule, Rfl: 1   Allergies: Allergies  Allergen Reactions    Meloxicam Other (See Comments)  Caused an injury to the kidneys, per nephrologist   Micardis Hct [Telmisartan-Hctz] Other (See Comments)    Caused an injury to the kidneys, per nephrologist    REVIEW OF SYSTEMS:   Review of Systems  Constitutional:  Negative for chills, fatigue and fever.  HENT:   Negative for lump/mass, mouth sores, nosebleeds, sore throat and trouble swallowing.   Eyes:  Negative for eye problems.  Respiratory:  Positive for cough and shortness of breath.   Cardiovascular:  Negative for chest pain, leg swelling and palpitations.  Gastrointestinal:  Positive for diarrhea and nausea. Negative for abdominal pain, constipation and vomiting.  Genitourinary:  Negative for bladder incontinence, difficulty urinating, dysuria, frequency, hematuria and nocturia.   Musculoskeletal:  Negative for arthralgias, back pain, flank pain, myalgias and neck pain.  Skin:  Negative for itching and rash.  Neurological:  Positive for numbness. Negative for dizziness and headaches.  Hematological:  Does not bruise/bleed easily.  Psychiatric/Behavioral:  Positive for sleep disturbance. Negative for depression and suicidal ideas. The patient is not nervous/anxious.   All other systems reviewed and are negative.    VITALS:   Blood pressure (!) 158/80, pulse 73, temperature 98.5 F (36.9 C), temperature source Oral, resp. rate 18, height 5\' 5"  (1.651 m), weight 152 lb 3.2 oz (69 kg), SpO2 95 %.  Wt Readings from Last 3 Encounters:  04/01/23 152 lb 3.2 oz (69 kg)  12/18/22 140 lb (63.5 kg)  12/17/22 145 lb 9.6 oz (66 kg)    Body mass index is 25.33 kg/m.  Performance status (ECOG): 1 - Symptomatic but completely ambulatory  PHYSICAL EXAM:   Physical Exam Vitals and nursing note reviewed. Exam conducted with a chaperone present.  Constitutional:      Appearance: Normal appearance.  Cardiovascular:     Rate and Rhythm: Normal rate and regular rhythm.     Pulses: Normal pulses.      Heart sounds: Normal heart sounds.  Pulmonary:     Effort: Pulmonary effort is normal.     Breath sounds: Normal breath sounds.  Abdominal:     Palpations: Abdomen is soft. There is no hepatomegaly, splenomegaly or mass.     Tenderness: There is no abdominal tenderness.  Musculoskeletal:     Right lower leg: No edema.     Left lower leg: No edema.  Lymphadenopathy:     Cervical: No cervical adenopathy.     Right cervical: No superficial, deep or posterior cervical adenopathy.    Left cervical: No superficial, deep or posterior cervical adenopathy.     Upper Body:     Right upper body: No supraclavicular or axillary adenopathy.     Left upper body: No supraclavicular or axillary adenopathy.  Neurological:     General: No focal deficit present.     Mental Status: She is alert and oriented to person, place, and time.  Psychiatric:        Mood and Affect: Mood normal.        Behavior: Behavior normal.     LABS:      Latest Ref Rng & Units 04/01/2023    9:01 AM 02/12/2023    8:02 AM 01/14/2023   12:15 PM  CBC  WBC 4.0 - 10.5 K/uL 2.5  2.3  2.5   Hemoglobin 12.0 - 15.0 g/dL 8.3  9.7  9.2   Hematocrit 36.0 - 46.0 % 28.4  33.4  32.1   Platelets 150 - 400 K/uL 44  50  58  Latest Ref Rng & Units 04/01/2023    9:01 AM 02/12/2023    8:02 AM 11/07/2022    5:38 AM  CMP  Glucose 70 - 99 mg/dL 97  96  161   BUN 8 - 23 mg/dL 20  39  35   Creatinine 0.44 - 1.00 mg/dL 0.96  0.45  4.09   Sodium 135 - 145 mmol/L 132  134  134   Potassium 3.5 - 5.1 mmol/L 4.5  5.3  4.6   Chloride 98 - 111 mmol/L 99  98  101   CO2 22 - 32 mmol/L 26  27  25    Calcium 8.9 - 10.3 mg/dL 8.8  8.9  9.2   Total Protein 6.5 - 8.1 g/dL 9.2  9.4    Total Bilirubin 0.3 - 1.2 mg/dL 0.7  0.8    Alkaline Phos 38 - 126 U/L 42  47    AST 15 - 41 U/L 28  25    ALT 0 - 44 U/L 13  10       No results found for: "CEA1", "CEA" / No results found for: "CEA1", "CEA" No results found for: "PSA1" No results found for:  "CAN199" No results found for: "CAN125"  Lab Results  Component Value Date   TOTALPROTELP 8.3 11/19/2020   ALBUMINELP 3.3 11/19/2020   A1GS 0.3 11/19/2020   A2GS 0.7 11/19/2020   BETS 1.3 11/19/2020   GAMS 2.6 (H) 11/19/2020   MSPIKE Not Observed 11/19/2020   SPEI Comment 11/19/2020   Lab Results  Component Value Date   TIBC 318 04/01/2023   TIBC 316 02/12/2023   TIBC 311 12/17/2022   FERRITIN 533 (H) 04/01/2023   FERRITIN 360 (H) 02/12/2023   FERRITIN 341 (H) 12/17/2022   IRONPCTSAT 19 04/01/2023   IRONPCTSAT 18 02/12/2023   IRONPCTSAT 20 12/17/2022   Lab Results  Component Value Date   LDH 287 (H) 04/01/2023   LDH 264 (H) 02/12/2023   LDH 314 (H) 11/03/2022     STUDIES:   No results found.

## 2023-04-01 ENCOUNTER — Inpatient Hospital Stay: Payer: Medicare Other

## 2023-04-01 ENCOUNTER — Inpatient Hospital Stay: Payer: Medicare Other | Attending: Hematology

## 2023-04-01 ENCOUNTER — Inpatient Hospital Stay (HOSPITAL_BASED_OUTPATIENT_CLINIC_OR_DEPARTMENT_OTHER): Payer: Medicare Other | Admitting: Hematology

## 2023-04-01 VITALS — BP 158/80 | HR 73 | Temp 98.5°F | Resp 18 | Ht 65.0 in | Wt 152.2 lb

## 2023-04-01 DIAGNOSIS — D539 Nutritional anemia, unspecified: Secondary | ICD-10-CM

## 2023-04-01 DIAGNOSIS — Z87891 Personal history of nicotine dependence: Secondary | ICD-10-CM | POA: Diagnosis not present

## 2023-04-01 DIAGNOSIS — E875 Hyperkalemia: Secondary | ICD-10-CM | POA: Insufficient documentation

## 2023-04-01 DIAGNOSIS — D469 Myelodysplastic syndrome, unspecified: Secondary | ICD-10-CM

## 2023-04-01 DIAGNOSIS — Z888 Allergy status to other drugs, medicaments and biological substances status: Secondary | ICD-10-CM | POA: Insufficient documentation

## 2023-04-01 DIAGNOSIS — C9310 Chronic myelomonocytic leukemia not having achieved remission: Secondary | ICD-10-CM | POA: Insufficient documentation

## 2023-04-01 DIAGNOSIS — Z809 Family history of malignant neoplasm, unspecified: Secondary | ICD-10-CM | POA: Diagnosis not present

## 2023-04-01 DIAGNOSIS — D61818 Other pancytopenia: Secondary | ICD-10-CM | POA: Diagnosis not present

## 2023-04-01 DIAGNOSIS — Z8719 Personal history of other diseases of the digestive system: Secondary | ICD-10-CM | POA: Diagnosis not present

## 2023-04-01 DIAGNOSIS — Z85118 Personal history of other malignant neoplasm of bronchus and lung: Secondary | ICD-10-CM | POA: Diagnosis not present

## 2023-04-01 DIAGNOSIS — Z79899 Other long term (current) drug therapy: Secondary | ICD-10-CM | POA: Insufficient documentation

## 2023-04-01 DIAGNOSIS — Z853 Personal history of malignant neoplasm of breast: Secondary | ICD-10-CM | POA: Insufficient documentation

## 2023-04-01 DIAGNOSIS — D4620 Refractory anemia with excess of blasts, unspecified: Secondary | ICD-10-CM | POA: Diagnosis present

## 2023-04-01 LAB — CBC WITH DIFFERENTIAL/PLATELET
Abs Immature Granulocytes: 0.1 10*3/uL — ABNORMAL HIGH (ref 0.00–0.07)
Band Neutrophils: 6 %
Basophils Absolute: 0 10*3/uL (ref 0.0–0.1)
Basophils Relative: 0 %
Eosinophils Absolute: 0 10*3/uL (ref 0.0–0.5)
Eosinophils Relative: 0 %
HCT: 28.4 % — ABNORMAL LOW (ref 36.0–46.0)
Hemoglobin: 8.3 g/dL — ABNORMAL LOW (ref 12.0–15.0)
Lymphocytes Relative: 73 %
Lymphs Abs: 1.8 10*3/uL (ref 0.7–4.0)
MCH: 29.4 pg (ref 26.0–34.0)
MCHC: 29.2 g/dL — ABNORMAL LOW (ref 30.0–36.0)
MCV: 100.7 fL — ABNORMAL HIGH (ref 80.0–100.0)
Metamyelocytes Relative: 2 %
Monocytes Absolute: 0.3 10*3/uL (ref 0.1–1.0)
Monocytes Relative: 11 %
Myelocytes: 1 %
Neutro Abs: 0.3 10*3/uL — CL (ref 1.7–7.7)
Neutrophils Relative %: 7 %
Platelets: 44 10*3/uL — ABNORMAL LOW (ref 150–400)
RBC: 2.82 MIL/uL — ABNORMAL LOW (ref 3.87–5.11)
RDW: 17.6 % — ABNORMAL HIGH (ref 11.5–15.5)
WBC: 2.5 10*3/uL — ABNORMAL LOW (ref 4.0–10.5)
nRBC: 6 /100 WBC — ABNORMAL HIGH
nRBC: 7.9 % — ABNORMAL HIGH (ref 0.0–0.2)

## 2023-04-01 LAB — COMPREHENSIVE METABOLIC PANEL
ALT: 13 U/L (ref 0–44)
AST: 28 U/L (ref 15–41)
Albumin: 3.6 g/dL (ref 3.5–5.0)
Alkaline Phosphatase: 42 U/L (ref 38–126)
Anion gap: 7 (ref 5–15)
BUN: 20 mg/dL (ref 8–23)
CO2: 26 mmol/L (ref 22–32)
Calcium: 8.8 mg/dL — ABNORMAL LOW (ref 8.9–10.3)
Chloride: 99 mmol/L (ref 98–111)
Creatinine, Ser: 0.89 mg/dL (ref 0.44–1.00)
GFR, Estimated: >60 mL/min (ref >60)
Glucose, Bld: 97 mg/dL (ref 70–99)
Potassium: 4.5 mmol/L (ref 3.5–5.1)
Sodium: 132 mmol/L — ABNORMAL LOW (ref 135–145)
Total Bilirubin: 0.7 mg/dL (ref 0.3–1.2)
Total Protein: 9.2 g/dL — ABNORMAL HIGH (ref 6.5–8.1)

## 2023-04-01 LAB — IRON AND TIBC
Iron: 60 ug/dL (ref 28–170)
Saturation Ratios: 19 % (ref 10.4–31.8)
TIBC: 318 ug/dL (ref 250–450)
UIBC: 258 ug/dL

## 2023-04-01 LAB — FERRITIN: Ferritin: 533 ng/mL — ABNORMAL HIGH (ref 11–307)

## 2023-04-01 LAB — LACTATE DEHYDROGENASE: LDH: 287 U/L — ABNORMAL HIGH (ref 98–192)

## 2023-04-01 MED ORDER — DARBEPOETIN ALFA 200 MCG/0.4ML IJ SOSY
200.0000 ug | PREFILLED_SYRINGE | Freq: Once | INTRAMUSCULAR | Status: AC
  Administered 2023-04-01: 200 ug via SUBCUTANEOUS
  Filled 2023-04-01: qty 0.4

## 2023-04-01 NOTE — Patient Instructions (Signed)
[facility] [place]  Discharge Instructions: Thank you for choosing [facility] to provide your oncology and hematology care.  If you have a lab appointment with the Cancer Center - please note that after April 8th, 2024, all labs will be drawn in the cancer center.  You do not have to check in or register with the main entrance as you have in the past but will complete your check-in in the cancer center.  Wear comfortable clothing and clothing appropriate for easy access to any Portacath or PICC line.   We strive to give you quality time with your provider. You may need to reschedule your appointment if you arrive late (15 or more minutes).  Arriving late affects you and other patients whose appointments are after yours.  Also, if you miss three or more appointments without notifying the office, you may be dismissed from the clinic at the provider's discretion.      For prescription refill requests, have your pharmacy contact our office and allow 72 hours for refills to be completed.    Today you received the following aranesp, return as scheduled.   To help prevent nausea and vomiting after your treatment, we encourage you to take your nausea medication as directed.  BELOW ARE SYMPTOMS THAT SHOULD BE REPORTED IMMEDIATELY: *FEVER GREATER THAN 100.4 F (38 C) OR HIGHER *CHILLS OR SWEATING *NAUSEA AND VOMITING THAT IS NOT CONTROLLED WITH YOUR NAUSEA MEDICATION *UNUSUAL SHORTNESS OF BREATH *UNUSUAL BRUISING OR BLEEDING *URINARY PROBLEMS (pain or burning when urinating, or frequent urination) *BOWEL PROBLEMS (unusual diarrhea, constipation, pain near the anus) TENDERNESS IN MOUTH AND THROAT WITH OR WITHOUT PRESENCE OF ULCERS (sore throat, sores in mouth, or a toothache) UNUSUAL RASH, SWELLING OR PAIN  UNUSUAL VAGINAL DISCHARGE OR ITCHING   Items with * indicate a potential emergency and should be followed up as soon as possible or go to the Emergency Department if  any problems should occur.  Please show the CHEMOTHERAPY ALERT CARD or IMMUNOTHERAPY ALERT CARD at check-in to the Emergency Department and triage nurse.  Should you have questions after your visit or need to cancel or reschedule your appointment, please contact [facility] [phone]  and follow the prompts.  Office hours are 8:00 a.m. to 4:30 p.m. Monday - Friday. Please note that voicemails left after 4:00 p.m. may not be returned until the following business day.  We are closed weekends and major holidays. You have access to a nurse at all times for urgent questions. Please call the main number to the clinic [phone] and follow the prompts.  For any non-urgent questions, you may also contact your provider using MyChart. We now offer e-Visits for anyone 18 and older to request care online for non-urgent symptoms. For details visit [email or web address].   Also download the MyChart app! Go to the app store, search "MyChart", open the app, select [facility], and log in with your MyChart username and password.   

## 2023-04-01 NOTE — Progress Notes (Signed)
Patient presents today for Aranesp injection. Hemoglobin reviewed prior to administration. VSS. Injection tolerated without incident or complaint. Patient stable during and after injection. See [name] for details. Patient discharged in satisfactory condition with no s/s of distress noted. 

## 2023-04-01 NOTE — Patient Instructions (Addendum)
[facility] Discharge Instructions   You were seen and examined today by Dr. [name].  He reviewed the results of your lab work. Your hemoglobin is down to 8.3. Your platelet count is also lower at 44,000. The absolute neutrophil count is 300.   Dr. Kirtland Bouchard recommends holding the 81 mg aspirin since your platelet count is down. The aspirin can cause the platelets to be lower and less effective in helping the blood to clot properly.   We will arrange for you to have a bone marrow biopsy since your hemoglobin and platelets are dropping to investigate this further.   Return as scheduled.    Thank you for choosing [facility] to provide your oncology and hematology care.  To afford each patient quality time with our provider, please arrive at least 15 minutes before your scheduled appointment time.   If you have a lab appointment with the Cancer Center please come in thru the Main Entrance and check in at the main information desk.  You need to re-schedule your appointment should you arrive 10 or more minutes late.  We strive to give you quality time with our providers, and arriving late affects you and other patients whose appointments are after yours.  Also, if you no show three or more times for appointments you may be dismissed from the clinic at the providers discretion.     Again, thank you for choosing [facility].  Our hope is that these requests will decrease the amount of time that you wait before being seen by our physicians.       _____________________________________________________________  Should you have questions after your visit to [facility], please contact our office at [phone] and follow the prompts.  Our office hours are 8:00 a.m. and 4:30 p.m. Monday - Friday.  Please note that voicemails left after 4:00 p.m. may not be returned until the following business day.  We are  closed weekends and major holidays.  You do have access to a nurse 24-7, just call the main number to the clinic [phone] and do not press any options, hold on the line and a nurse will answer the phone.    For prescription refill requests, have your pharmacy contact our office and allow 72 hours.    Due to Covid, you will need to wear a mask upon entering the hospital. If you do not have a mask, a mask will be given to you at the Main Entrance upon arrival. For doctor visits, patients may have 1 support person age 2 or older with them. For treatment visits, patients can not have anyone with them due to social distancing guidelines and our immunocompromised population.

## 2023-04-01 NOTE — Progress Notes (Signed)
CRITICAL VALUE ALERT Critical value received:  ANC 0.3 Date of notification:  04-01-23 Time of notification: 1003 Critical value read back:  Yes.   Nurse who received alert:  C. Casy Tavano RN MD notified time and response:  Dr. [name] no new orders.

## 2023-04-05 ENCOUNTER — Other Ambulatory Visit: Payer: Self-pay | Admitting: *Deleted

## 2023-04-06 ENCOUNTER — Other Ambulatory Visit: Payer: Self-pay | Admitting: *Deleted

## 2023-04-06 ENCOUNTER — Emergency Department (HOSPITAL_COMMUNITY): Payer: Medicare Other

## 2023-04-06 ENCOUNTER — Other Ambulatory Visit: Payer: Self-pay

## 2023-04-06 ENCOUNTER — Inpatient Hospital Stay (HOSPITAL_COMMUNITY)
Admission: EM | Admit: 2023-04-06 | Discharge: 2023-04-09 | DRG: 280 | Disposition: A | Discharge status: Home / Self Care | Payer: Medicare Other | Attending: Family Medicine | Admitting: Family Medicine

## 2023-04-06 ENCOUNTER — Telehealth: Payer: Self-pay | Admitting: *Deleted

## 2023-04-06 ENCOUNTER — Encounter (HOSPITAL_COMMUNITY): Payer: Self-pay

## 2023-04-06 DIAGNOSIS — Z8719 Personal history of other diseases of the digestive system: Secondary | ICD-10-CM

## 2023-04-06 DIAGNOSIS — Z9981 Dependence on supplemental oxygen: Secondary | ICD-10-CM

## 2023-04-06 DIAGNOSIS — E871 Hypo-osmolality and hyponatremia: Secondary | ICD-10-CM | POA: Diagnosis present

## 2023-04-06 DIAGNOSIS — I7143 Infrarenal abdominal aortic aneurysm, without rupture: Secondary | ICD-10-CM | POA: Diagnosis not present

## 2023-04-06 DIAGNOSIS — J9611 Chronic respiratory failure with hypoxia: Secondary | ICD-10-CM | POA: Diagnosis present

## 2023-04-06 DIAGNOSIS — I214 Non-ST elevation (NSTEMI) myocardial infarction: Secondary | ICD-10-CM

## 2023-04-06 DIAGNOSIS — D469 Myelodysplastic syndrome, unspecified: Secondary | ICD-10-CM | POA: Diagnosis present

## 2023-04-06 DIAGNOSIS — D630 Anemia in neoplastic disease: Secondary | ICD-10-CM | POA: Diagnosis present

## 2023-04-06 DIAGNOSIS — I110 Hypertensive heart disease with heart failure: Secondary | ICD-10-CM | POA: Diagnosis present

## 2023-04-06 DIAGNOSIS — R531 Weakness: Secondary | ICD-10-CM | POA: Diagnosis not present

## 2023-04-06 DIAGNOSIS — J441 Chronic obstructive pulmonary disease with (acute) exacerbation: Secondary | ICD-10-CM | POA: Diagnosis present

## 2023-04-06 DIAGNOSIS — D61818 Other pancytopenia: Secondary | ICD-10-CM | POA: Diagnosis present

## 2023-04-06 DIAGNOSIS — Z886 Allergy status to analgesic agent status: Secondary | ICD-10-CM

## 2023-04-06 DIAGNOSIS — I340 Nonrheumatic mitral (valve) insufficiency: Secondary | ICD-10-CM | POA: Diagnosis not present

## 2023-04-06 DIAGNOSIS — J449 Chronic obstructive pulmonary disease, unspecified: Secondary | ICD-10-CM | POA: Diagnosis not present

## 2023-04-06 DIAGNOSIS — Z7982 Long term (current) use of aspirin: Secondary | ICD-10-CM

## 2023-04-06 DIAGNOSIS — R197 Diarrhea, unspecified: Secondary | ICD-10-CM

## 2023-04-06 DIAGNOSIS — K580 Irritable bowel syndrome with diarrhea: Secondary | ICD-10-CM | POA: Diagnosis present

## 2023-04-06 DIAGNOSIS — Z79899 Other long term (current) drug therapy: Secondary | ICD-10-CM | POA: Diagnosis not present

## 2023-04-06 DIAGNOSIS — Z7951 Long term (current) use of inhaled steroids: Secondary | ICD-10-CM | POA: Diagnosis not present

## 2023-04-06 DIAGNOSIS — Z79890 Hormone replacement therapy (postmenopausal): Secondary | ICD-10-CM

## 2023-04-06 DIAGNOSIS — R7989 Other specified abnormal findings of blood chemistry: Secondary | ICD-10-CM | POA: Diagnosis not present

## 2023-04-06 DIAGNOSIS — E875 Hyperkalemia: Secondary | ICD-10-CM | POA: Diagnosis present

## 2023-04-06 DIAGNOSIS — R0602 Shortness of breath: Secondary | ICD-10-CM | POA: Diagnosis not present

## 2023-04-06 DIAGNOSIS — Z888 Allergy status to other drugs, medicaments and biological substances status: Secondary | ICD-10-CM

## 2023-04-06 DIAGNOSIS — I480 Paroxysmal atrial fibrillation: Secondary | ICD-10-CM | POA: Diagnosis not present

## 2023-04-06 DIAGNOSIS — I080 Rheumatic disorders of both mitral and aortic valves: Secondary | ICD-10-CM | POA: Diagnosis present

## 2023-04-06 DIAGNOSIS — G9341 Metabolic encephalopathy: Secondary | ICD-10-CM | POA: Diagnosis present

## 2023-04-06 DIAGNOSIS — Z85118 Personal history of other malignant neoplasm of bronchus and lung: Secondary | ICD-10-CM

## 2023-04-06 DIAGNOSIS — Z87891 Personal history of nicotine dependence: Secondary | ICD-10-CM

## 2023-04-06 DIAGNOSIS — I5033 Acute on chronic diastolic (congestive) heart failure: Secondary | ICD-10-CM | POA: Diagnosis not present

## 2023-04-06 DIAGNOSIS — I350 Nonrheumatic aortic (valve) stenosis: Secondary | ICD-10-CM | POA: Diagnosis not present

## 2023-04-06 DIAGNOSIS — E860 Dehydration: Secondary | ICD-10-CM | POA: Diagnosis present

## 2023-04-06 DIAGNOSIS — Z853 Personal history of malignant neoplasm of breast: Secondary | ICD-10-CM

## 2023-04-06 DIAGNOSIS — Z902 Acquired absence of lung [part of]: Secondary | ICD-10-CM

## 2023-04-06 DIAGNOSIS — D62 Acute posthemorrhagic anemia: Secondary | ICD-10-CM | POA: Diagnosis present

## 2023-04-06 DIAGNOSIS — D649 Anemia, unspecified: Secondary | ICD-10-CM | POA: Diagnosis not present

## 2023-04-06 DIAGNOSIS — Z923 Personal history of irradiation: Secondary | ICD-10-CM

## 2023-04-06 DIAGNOSIS — E039 Hypothyroidism, unspecified: Secondary | ICD-10-CM | POA: Diagnosis present

## 2023-04-06 HISTORY — DX: Non-ST elevation (NSTEMI) myocardial infarction: I21.4

## 2023-04-06 LAB — CBC WITH DIFFERENTIAL/PLATELET
Abs Immature Granulocytes: 0.1 10*3/uL — ABNORMAL HIGH (ref 0.00–0.07)
Basophils Absolute: 0 10*3/uL (ref 0.0–0.1)
Basophils Relative: 0 %
Eosinophils Absolute: 0 10*3/uL (ref 0.0–0.5)
Eosinophils Relative: 0 %
HCT: 23.9 % — ABNORMAL LOW (ref 36.0–46.0)
Hemoglobin: 7 g/dL — ABNORMAL LOW (ref 12.0–15.0)
Lymphocytes Relative: 37 %
Lymphs Abs: 1.1 10*3/uL (ref 0.7–4.0)
MCH: 28.9 pg (ref 26.0–34.0)
MCHC: 29.3 g/dL — ABNORMAL LOW (ref 30.0–36.0)
MCV: 98.8 fL (ref 80.0–100.0)
Metamyelocytes Relative: 2 %
Monocytes Absolute: 1.5 10*3/uL — ABNORMAL HIGH (ref 0.1–1.0)
Monocytes Relative: 52 %
Neutro Abs: 0.3 10*3/uL — CL (ref 1.7–7.7)
Neutrophils Relative %: 9 %
Platelets: 66 10*3/uL — ABNORMAL LOW (ref 150–400)
RBC: 2.42 MIL/uL — ABNORMAL LOW (ref 3.87–5.11)
RDW: 17.7 % — ABNORMAL HIGH (ref 11.5–15.5)
WBC: 2.9 10*3/uL — ABNORMAL LOW (ref 4.0–10.5)
nRBC: 3 /100 WBC — ABNORMAL HIGH
nRBC: 5.4 % — ABNORMAL HIGH (ref 0.0–0.2)

## 2023-04-06 LAB — URINALYSIS, ROUTINE W REFLEX MICROSCOPIC
Bilirubin Urine: NEGATIVE
Glucose, UA: NEGATIVE mg/dL
Hgb urine dipstick: NEGATIVE
Ketones, ur: NEGATIVE mg/dL
Leukocytes,Ua: NEGATIVE
Nitrite: NEGATIVE
Protein, ur: NEGATIVE mg/dL
Specific Gravity, Urine: 1.006 (ref 1.005–1.030)
pH: 6 (ref 5.0–8.0)

## 2023-04-06 LAB — VITAMIN B12: Vitamin B-12: 766 pg/mL (ref 180–914)

## 2023-04-06 LAB — COMPREHENSIVE METABOLIC PANEL
ALT: 15 U/L (ref 0–44)
AST: 32 U/L (ref 15–41)
Albumin: 3.2 g/dL — ABNORMAL LOW (ref 3.5–5.0)
Alkaline Phosphatase: 36 U/L — ABNORMAL LOW (ref 38–126)
Anion gap: 7 (ref 5–15)
BUN: 22 mg/dL (ref 8–23)
CO2: 28 mmol/L (ref 22–32)
Calcium: 8.7 mg/dL — ABNORMAL LOW (ref 8.9–10.3)
Chloride: 93 mmol/L — ABNORMAL LOW (ref 98–111)
Creatinine, Ser: 0.93 mg/dL (ref 0.44–1.00)
GFR, Estimated: 59 mL/min — ABNORMAL LOW (ref >60)
Glucose, Bld: 102 mg/dL — ABNORMAL HIGH (ref 70–99)
Potassium: 5.4 mmol/L — ABNORMAL HIGH (ref 3.5–5.1)
Sodium: 128 mmol/L — ABNORMAL LOW (ref 135–145)
Total Bilirubin: 0.8 mg/dL (ref 0.3–1.2)
Total Protein: 8.8 g/dL — ABNORMAL HIGH (ref 6.5–8.1)

## 2023-04-06 LAB — FOLATE: Folate: 36.5 ng/mL (ref >5.9)

## 2023-04-06 LAB — T4, FREE: Free T4: 1.21 ng/dL — ABNORMAL HIGH (ref 0.61–1.12)

## 2023-04-06 LAB — TROPONIN I (HIGH SENSITIVITY)
Troponin I (High Sensitivity): 319 ng/L (ref <18)
Troponin I (High Sensitivity): 325 ng/L (ref <18)

## 2023-04-06 LAB — TYPE AND SCREEN

## 2023-04-06 LAB — TSH: TSH: 0.735 u[IU]/mL (ref 0.350–4.500)

## 2023-04-06 LAB — AMMONIA: Ammonia: 18 umol/L (ref 9–35)

## 2023-04-06 LAB — CULTURE, BLOOD (ROUTINE X 2)

## 2023-04-06 LAB — PREPARE RBC (CROSSMATCH)

## 2023-04-06 MED ORDER — SODIUM BICARBONATE 8.4 % IV SOLN
50.0000 meq | Freq: Once | INTRAVENOUS | Status: AC
  Administered 2023-04-06: 50 meq via INTRAVENOUS

## 2023-04-06 MED ORDER — LEVOTHYROXINE SODIUM 100 MCG PO TABS
100.0000 ug | ORAL_TABLET | Freq: Every day | ORAL | Status: DC
  Administered 2023-04-07 – 2023-04-09 (×3): 100 ug via ORAL
  Filled 2023-04-06 (×3): qty 1

## 2023-04-06 MED ORDER — METOPROLOL SUCCINATE ER 25 MG PO TB24
25.0000 mg | ORAL_TABLET | Freq: Every day | ORAL | Status: DC
  Administered 2023-04-07 – 2023-04-09 (×3): 25 mg via ORAL
  Filled 2023-04-06 (×3): qty 1

## 2023-04-06 MED ORDER — METHYLPREDNISOLONE SODIUM SUCC 125 MG IJ SOLR
125.0000 mg | Freq: Once | INTRAMUSCULAR | Status: AC
  Administered 2023-04-06: 125 mg via INTRAVENOUS
  Filled 2023-04-06: qty 2

## 2023-04-06 MED ORDER — ADULT MULTIVITAMIN W/MINERALS CH
1.0000 | ORAL_TABLET | Freq: Every day | ORAL | Status: DC
  Administered 2023-04-07 – 2023-04-09 (×3): 1 via ORAL
  Filled 2023-04-06 (×3): qty 1

## 2023-04-06 MED ORDER — ONDANSETRON HCL 4 MG PO TABS: 4.0000 mg | ORAL_TABLET | Freq: Four times a day (QID) | ORAL | Status: DC | PRN

## 2023-04-06 MED ORDER — ARFORMOTEROL TARTRATE 15 MCG/2ML IN NEBU
15.0000 ug | INHALATION_SOLUTION | Freq: Two times a day (BID) | RESPIRATORY_TRACT | Status: DC
  Administered 2023-04-06 – 2023-04-09 (×6): 15 ug via RESPIRATORY_TRACT
  Filled 2023-04-06 (×6): qty 2

## 2023-04-06 MED ORDER — SODIUM ZIRCONIUM CYCLOSILICATE 10 G PO PACK
10.0000 g | PACK | Freq: Once | ORAL | Status: AC
  Administered 2023-04-06: 10 g via ORAL
  Filled 2023-04-06: qty 1

## 2023-04-06 MED ORDER — ACETAMINOPHEN 650 MG RE SUPP: 650.0000 mg | Freq: Four times a day (QID) | RECTAL | Status: DC | PRN

## 2023-04-06 MED ORDER — PANTOPRAZOLE SODIUM 40 MG IV SOLR
40.0000 mg | Freq: Two times a day (BID) | INTRAVENOUS | Status: DC
  Administered 2023-04-06 – 2023-04-09 (×6): 40 mg via INTRAVENOUS
  Filled 2023-04-06 (×6): qty 10

## 2023-04-06 MED ORDER — ACETAMINOPHEN 325 MG PO TABS
650.0000 mg | ORAL_TABLET | Freq: Four times a day (QID) | ORAL | Status: DC | PRN
  Administered 2023-04-06 – 2023-04-07 (×2): 650 mg via ORAL
  Filled 2023-04-06 (×2): qty 2

## 2023-04-06 MED ORDER — METHYLPREDNISOLONE SODIUM SUCC 125 MG IJ SOLR
60.0000 mg | Freq: Two times a day (BID) | INTRAMUSCULAR | Status: DC
  Administered 2023-04-07 (×2): 60 mg via INTRAVENOUS
  Filled 2023-04-06 (×2): qty 2

## 2023-04-06 MED ORDER — MIRTAZAPINE 15 MG PO TABS
7.5000 mg | ORAL_TABLET | Freq: Every day | ORAL | Status: DC
  Administered 2023-04-06 – 2023-04-08 (×3): 7.5 mg via ORAL
  Filled 2023-04-06 (×3): qty 1

## 2023-04-06 MED ORDER — SODIUM CHLORIDE 0.9 % IV SOLN: INTRAVENOUS | Status: AC

## 2023-04-06 MED ORDER — GABAPENTIN 100 MG PO CAPS: 100.0000 mg | ORAL_CAPSULE | Freq: Two times a day (BID) | ORAL | Status: DC | PRN

## 2023-04-06 MED ORDER — SODIUM ZIRCONIUM CYCLOSILICATE 5 G PO PACK
10.0000 g | PACK | Freq: Every day | ORAL | Status: DC
  Administered 2023-04-07 – 2023-04-09 (×3): 10 g via ORAL
  Filled 2023-04-06 (×4): qty 2

## 2023-04-06 MED ORDER — ISOSORBIDE MONONITRATE ER 60 MG PO TB24
30.0000 mg | ORAL_TABLET | Freq: Every day | ORAL | Status: DC
  Administered 2023-04-06 – 2023-04-09 (×4): 30 mg via ORAL
  Filled 2023-04-06 (×4): qty 1

## 2023-04-06 MED ORDER — PANTOPRAZOLE SODIUM 40 MG PO TBEC: 40.0000 mg | DELAYED_RELEASE_TABLET | Freq: Every day | ORAL | Status: DC

## 2023-04-06 MED ORDER — MAGNESIUM OXIDE -MG SUPPLEMENT 400 (240 MG) MG PO TABS
400.0000 mg | ORAL_TABLET | Freq: Two times a day (BID) | ORAL | Status: DC
  Administered 2023-04-06 – 2023-04-09 (×6): 400 mg via ORAL
  Filled 2023-04-06 (×6): qty 1

## 2023-04-06 MED ORDER — ONDANSETRON HCL 4 MG/2ML IJ SOLN: 4.0000 mg | Freq: Four times a day (QID) | INTRAMUSCULAR | Status: DC | PRN

## 2023-04-06 MED ORDER — BUDESONIDE 0.5 MG/2ML IN SUSP
0.5000 mg | Freq: Two times a day (BID) | RESPIRATORY_TRACT | Status: DC
  Administered 2023-04-06 – 2023-04-09 (×6): 0.5 mg via RESPIRATORY_TRACT
  Filled 2023-04-06 (×6): qty 2

## 2023-04-06 MED ORDER — SODIUM CHLORIDE 0.9% IV SOLUTION: Freq: Once | INTRAVENOUS | Status: AC

## 2023-04-06 MED ORDER — CALCIUM GLUCONATE-NACL 1-0.675 GM/50ML-% IV SOLN
1.0000 g | Freq: Once | INTRAVENOUS | Status: AC
  Administered 2023-04-06: 1000 mg via INTRAVENOUS
  Filled 2023-04-06: qty 50

## 2023-04-06 MED ORDER — IPRATROPIUM-ALBUTEROL 0.5-2.5 (3) MG/3ML IN SOLN
3.0000 mL | Freq: Four times a day (QID) | RESPIRATORY_TRACT | Status: DC
  Administered 2023-04-06 – 2023-04-08 (×6): 3 mL via RESPIRATORY_TRACT
  Filled 2023-04-06 (×6): qty 3

## 2023-04-06 NOTE — Hospital Course (Signed)
89 year old female with a history of myelodysplastic syndrome, COPD, hypertension, chronic respiratory failure on 2 L, paroxysmal atrial fibrillation on aspirin, lung adenocarcinoma status post LUL lobectomy, breast cancer, and HFpEF presenting with 3-day history of generalized weakness, shortness of breath, and decreased oral intake.  The patient's daughter at the bedside also relates that she has been a little bit confused.  The patient states that she has had more more difficulty getting out of bed and getting back into bed secondary to generalized weakness.  She has had some subjective fevers and chills.  She states that she has had more dyspnea on exertion.  She has also been complaining of intermittent chest discomfort with exertion over the past month.  She denies any headache, neck pain, nausea, vomiting, diarrhea.  She has had some loose stools without any hematochezia.  She has had some dark stools.  She denies any hematemesis.  She denies any dysuria or hematuria.  Daughter states that the patient has also had decreased oral intake.  Notably, the patient recently saw Dr. [name] in the office on 04/01/2023.  There are plans for bone marrow biopsy to be done.  Because of the above symptoms, she presented for further evaluation and treatment. In the ED, the patient was afebrile hemodynamically stable with oxygen saturation 95% on 2 L.  WBC 2.9, hemoglobin 10.0, platelets 66,000.  Sodium 128, potassium 5.4, bicarbonate 20, serum creatinine 0.93.  Chest x-ray showed chronic interstitial markings with hyperinflation.  Troponin 319.  EKG showed sinus rhythm with nonspecific STT changes.

## 2023-04-06 NOTE — ED Triage Notes (Signed)
Pt reports having a hx of anemia and in need of a bone marrow biopsy to figure out why she is staying anemic.

## 2023-04-06 NOTE — ED Provider Notes (Signed)
[place] EMERGENCY DEPARTMENT AT [facility] Provider Note   CSN: [id number] Arrival date & time: 04/06/23  1134     History {Add pertinent medical, surgical, social history, OB history to HPI:1} Chief Complaint  Patient presents with   Fatigue    Lisa [name] is a 89 y.o. female.  Patient has a history of mild dysplastic syndrome.  She complains of shortness of breath and weakness and occasional chest discomfort.   Weakness      Home Medications Prior to Admission medications   Medication Sig Start Date End Date Taking? Authorizing Provider  acetaminophen (TYLENOL) 325 MG tablet Take 2 tablets (650 mg total) by mouth every 6 (six) hours. 11/11/20  Yes Angiulli, Mcarthur Rossetti, PA-C  albuterol (VENTOLIN HFA) 108 (90 Base) MCG/ACT inhaler Inhale 1-2 puffs into the lungs every 4 (four) hours as needed for wheezing or shortness of breath. 05/25/22  Yes [provider]  Fluticasone-Umeclidin-Vilant (TRELEGY ELLIPTA) 100-62.5-25 MCG/ACT AEPB Inhale 1 puff into the lungs daily. 12/18/22  Yes Oretha Milch, MD  furosemide (LASIX) 40 MG tablet Take 1 tablet (40 mg total) by mouth daily. 01/06/23  Yes Mallipeddi, Vishnu P, MD  gabapentin (NEURONTIN) 100 MG capsule Take 1 capsule (100 mg total) by mouth every 12 (twelve) hours as needed (neuropathy). 02/11/22 04/06/23 Yes Vassie Loll, MD  levothyroxine (SYNTHROID) 100 MCG tablet Take 1 tablet (100 mcg total) by mouth daily before breakfast. 08/28/21  Yes Johnson, Clanford L, MD  LOKELMA 10 g PACK packet Take 10 g by mouth daily. 01/06/23  Yes [provider]  magnesium oxide (MAG-OX) 400 MG tablet Take 400 mg by mouth 2 (two) times daily.   Yes [provider]  metoprolol succinate (TOPROL-XL) 25 MG 24 hr tablet TAKE 1 TABLET DAILY 02/18/23  Yes Mallipeddi, Vishnu P, MD  mirtazapine (REMERON) 7.5 MG tablet Take 1 tablet (7.5 mg total) by mouth at bedtime. 05/28/21  Yes Artis Delay, MD  Multiple Vitamin  (MULTI-VITAMIN) tablet Take 1 tablet by mouth daily.   Yes [provider]  pantoprazole (PROTONIX) 40 MG tablet Take 1 tablet (40 mg total) by mouth daily. 11/08/22  Yes Vassie Loll, MD  aspirin EC 81 MG EC tablet Take 1 tablet (81 mg total) by mouth daily. Swallow whole. Patient not taking: Reported on 04/06/2023 02/12/22   Vassie Loll, MD      Allergies    Meloxicam and Micardis hct [telmisartan-hctz]    Review of Systems   Review of Systems  Neurological:  Positive for weakness.    Physical Exam Updated Vital Signs BP (!) 130/99   Pulse 72   Temp 98.7 F (37.1 C) (Oral)   Resp (!) 34   Ht 5\' 5"  (1.651 m)   Wt 69 kg   SpO2 95%   BMI 25.33 kg/m  Physical Exam  ED Results / Procedures / Treatments   Labs (all labs ordered are listed, but only abnormal results are displayed) Labs Reviewed  CBC WITH DIFFERENTIAL/PLATELET - Abnormal; Notable for the following components:      Result Value   WBC 2.9 (*)    RBC 2.42 (*)    Hemoglobin 7.0 (*)    HCT 23.9 (*)    MCHC 29.3 (*)    RDW 17.7 (*)    Platelets 66 (*)    nRBC 5.4 (*)    Neutro Abs 0.3 (*)    Monocytes Absolute 1.5 (*)    nRBC 3 (*)    Abs  Immature Granulocytes 0.10 (*)    All other components within normal limits  COMPREHENSIVE METABOLIC PANEL - Abnormal; Notable for the following components:   Sodium 128 (*)    Potassium 5.4 (*)    Chloride 93 (*)    Glucose, Bld 102 (*)    Calcium 8.7 (*)    Total Protein 8.8 (*)    Albumin 3.2 (*)    Alkaline Phosphatase 36 (*)    GFR, Estimated 59 (*)    All other components within normal limits  TROPONIN I (HIGH SENSITIVITY) - Abnormal; Notable for the following components:   Troponin I (High Sensitivity) 319 (*)    All other components within normal limits  URINALYSIS, ROUTINE W REFLEX MICROSCOPIC  PATHOLOGIST SMEAR REVIEW  TYPE AND SCREEN  PREPARE RBC (CROSSMATCH)  TROPONIN I (HIGH SENSITIVITY)    EKG None  Radiology DG Chest Port 1  View  Result Date: 04/06/2023 CLINICAL DATA:  Shortness of breath EXAM: PORTABLE CHEST 1 VIEW COMPARISON:  11/03/2022 FINDINGS: Mild cardiomegaly. Aortic atherosclerosis. There is hyperinflation of the lungs compatible with COPD. Mild interstitial prominence is similar to prior study, likely chronic lung disease. Bibasilar linear densities compatible with atelectasis or scarring. No effusions. IMPRESSION: COPD/chronic changes. Cardiomegaly. Bibasilar atelectasis or scarring. Electronically Signed   By: Charlett Nose M.D.   On: 04/06/2023 12:40    Procedures Procedures  {Document cardiac monitor, telemetry assessment procedure when appropriate:1}  Medications Ordered in ED Medications  0.9 %  sodium chloride infusion (Manually program via Guardrails IV Fluids) (has no administration in time range)    ED Course/ Medical Decision Making/ A&P  CRITICAL CARE Performed by: Bethann Berkshire Total critical care time: 45 minutes Critical care time was exclusive of separately billable procedures and treating other patients. Critical care was necessary to treat or prevent imminent or life-threatening deterioration. Critical care was time spent personally by me on the following activities: development of treatment plan with patient and/or surrogate as well as nursing, discussions with consultants, evaluation of patient's response to treatment, examination of patient, obtaining history from patient or surrogate, ordering and performing treatments and interventions, ordering and review of laboratory studies, ordering and review of radiographic studies, pulse oximetry and re-evaluation of patient's condition.  {Patient with NSTEMI and anemia.  She will be transfused 1 unit of packed red blood cells.  I spoke with cardiology Dr. Jenene Slicker and she states she would only recommend Imdur or possible beta-blocker but no blood thinners.  She is going to put a consult note in the chart.  Dr. [name] also has been  contacted for oncology Click here for ABCD2, HEART and other calculatorsREFRESH Note before signing :1}                          Medical Decision Making Amount and/or Complexity of Data Reviewed Labs: ordered. Radiology: ordered. ECG/medicine tests: ordered.  Risk Prescription drug management. Decision regarding hospitalization.   Patient with NSTEMI and anemia.  She is being transfused and admitted to medicine  {Document critical care time when appropriate:1} {Document review of labs and clinical decision tools ie heart score, Chads2Vasc2 etc:1}  {Document your independent review of radiology images, and any outside records:1} {Document your discussion with family members, caretakers, and with consultants:1} {Document social determinants of health affecting pt's care:1} {Document your decision making why or why not admission, treatments were needed:1} Final Clinical Impression(s) / ED Diagnoses Final diagnoses:  NSTEMI (non-ST elevated myocardial infarction) (HCC)  Rx / DC Orders ED Discharge Orders     None

## 2023-04-06 NOTE — Consult Note (Addendum)
CARDIOLOGY CONSULT NOTE    Lisa [name] ID: Lisa [name]; [id number]; Jun 12, 1934   Admit date: 04/06/2023 Date of Consult: 04/06/2023  Primary Care Provider: [name], MD Primary Cardiologist:  Primary Electrophysiologist:     Lisa [name] Profile:   Lisa [name] is a 89 y.o. female who is being seen today for the evaluation of chest pain at the request of Dr. Estell Harpin.  History of Present Illness:   Lisa [name] is a 89 year old F known to have paroxysmal A-fib, moderate to severe aortic valve stenosis and moderate mitral valve regurgitation by echo in 1/24, myelodysplastic syndrome (anemia and thrombocytopenia) COPD on home oxygen 2L, breast cancer s/p lumpectomy and radiation, history of lung cancer s/p LUL lobectomy presented to ER with DOE and chest pain x 6 weeks. Lisa [name] is not much active at home. She started to have DOE, exertional and rest chest pains lasting for a few minutes and resolving with rest. This has been ongoing for the last 6 weeks.  No syncope, palpitations, leg swelling.  EKG showed NSR, no ischemia (had old nonspecific T wave inversions in the inferior leads).  High sensitive troponins were elevated, 319>>325.  Lisa [name] chest pain-free during interview.  Past Medical History:  Diagnosis Date   Adenocarcinoma of left lung (HCC) 2006   Arthritis    Asthma    Cancer of breast, female (HCC)    Cancer of lung (HCC)    Hypertension    Hypothyroidism    Invasive ductal carcinoma of left breast (HCC) 1999   Neutropenia (HCC) 06/09/2016   Personal history of radiation therapy     Past Surgical History:  Procedure Laterality Date   BIOPSY  10/23/2020   Procedure: BIOPSY;  Surgeon: Benancio Deeds, MD;  Location: [facility] ENDOSCOPY;  Service: Gastroenterology;;   BREAST BIOPSY     left axillary node dissection   BREAST LUMPECTOMY Left    COLONOSCOPY  03/08/2003   NWG:[id number] rectal polyps destroyed with the tip of the snare/Polyps at hepatic flexure, splenic flexure  at 35 cm/Left-sided diverticula: unable to retrieve path   COLONOSCOPY  09/12/2008   HQI:ONGEXB rectum and distal sigmoid diminutive polyps/scattered left sided diverticulum. hyperplastic   COLONOSCOPY N/A 03/06/2013   MWU:XLKGMWN polyp-removed as described above; colonic diverticulosis. hyperplastic polyps. next TCS 03/2018   ESOPHAGOGASTRODUODENOSCOPY (EGD) WITH PROPOFOL N/A 10/23/2020   Procedure: ESOPHAGOGASTRODUODENOSCOPY (EGD) WITH PROPOFOL;  Surgeon: Benancio Deeds, MD;  Location: [facility] ENDOSCOPY;  Service: Gastroenterology;  Laterality: N/A;   FOOT SURGERY     LUNG REMOVAL, PARTIAL     upper lobe       Inpatient Medications: Scheduled Meds:  arformoterol  15 mcg Nebulization BID   budesonide (PULMICORT) nebulizer solution  0.5 mg Nebulization BID   ipratropium-albuterol  3 mL Nebulization Q6H   isosorbide mononitrate  30 mg Oral Daily   [START ON 04/07/2023] levothyroxine  100 mcg Oral QAC breakfast   magnesium oxide  400 mg Oral BID   [START ON 04/07/2023] methylPREDNISolone (SOLU-MEDROL) injection  60 mg Intravenous Q12H   [START ON 04/07/2023] metoprolol succinate  25 mg Oral Daily   mirtazapine  7.5 mg Oral QHS   [START ON 04/07/2023] multivitamin with minerals  1 tablet Oral Daily   pantoprazole (PROTONIX) IV  40 mg Intravenous Q12H   [START ON 04/07/2023] sodium zirconium cyclosilicate  10 g Oral Daily   Continuous Infusions:  sodium chloride     calcium gluconate 1,000 mg (04/06/23 1557)   PRN Meds: acetaminophen **OR** acetaminophen, gabapentin,  ondansetron **OR** ondansetron (ZOFRAN) IV  Allergies:    Allergies  Allergen Reactions   Meloxicam Other (See Comments)    Caused an injury to the kidneys, per nephrologist   Micardis Hct [Telmisartan-Hctz] Other (See Comments)    Caused an injury to the kidneys, per nephrologist    Social History:   Social History   Socioeconomic History   Marital status: Widowed    Spouse name: Not on file   Number of children: Not  on file   Years of education: Not on file   Highest education level: Not on file  Occupational History   Not on file  Tobacco Use   Smoking status: Former    Packs/day: 0.75    Years: 35.00    Additional pack years: 0.00    Total pack years: 26.25    Types: Cigarettes    Quit date: 25    Years since quitting: 31.4   Smokeless tobacco: Never   Tobacco comments:    smoke-free X 30 yeras  Vaping Use   Vaping Use: Never used  Substance and Sexual Activity   Alcohol use: No   Drug use: No   Sexual activity: Not Currently  Other Topics Concern   Not on file  Social History Narrative   Not on file   Social Determinants of Health   Financial Resource Strain: Not on file  Food Insecurity: No Food Insecurity (01/15/2023)   Hunger Vital Sign    Worried About Running Out of Food in the Last Year: Never true    Ran Out of Food in the Last Year: Never true  Transportation Needs: No Transportation Needs (01/15/2023)   PRAPARE - Administrator, Civil Service (Medical): No    Lack of Transportation (Non-Medical): No  Physical Activity: Inactive (12/12/2020)   Exercise Vital Sign    Days of Exercise per Week: 0 days    Minutes of Exercise per Session: 0 min  Stress: Not on file  Social Connections: Not on file  Intimate Partner Violence: Not At Risk (01/15/2023)   Humiliation, Afraid, Rape, and Kick questionnaire    Fear of Current or Ex-Partner: No    Emotionally Abused: No    Physically Abused: No    Sexually Abused: No    Family History:    Family History  Problem Relation Age of Onset   Cancer Mother    Cancer Sister    Colon cancer Neg Hx      ROS:  Please see the history of present illness.  ROS  All other ROS reviewed and negative.     Physical Exam/Data:   Vitals:   04/06/23 1425 04/06/23 1500 04/06/23 1540 04/06/23 1600  BP: (!) 149/81 (!) 116/100 (!) 174/68 (!) 153/73  Pulse: 74 79 73 73  Resp: (!) 28 (!) 36 (!) 36 (!) 30  Temp: 97.9 F (36.6  C) 98.2 F (36.8 C) 98.2 F (36.8 C) 97.8 F (36.6 C)  TempSrc:   Oral Oral  SpO2: 95% 94% 96% 96%  Weight:  67.1 kg    Height:  5\' 4"  (1.626 m)     No intake or output data in the 24 hours ending 04/06/23 1625 Filed Weights   04/06/23 1142 04/06/23 1500  Weight: 69 kg 67.1 kg   Body mass index is 25.4 kg/m.  General:  Well nourished, well developed, in no acute distress HEENT: normal Lymph: no adenopathy Neck: no JVD Endocrine:  No thryomegaly Vascular: No carotid bruits; FA  pulses 2+ bilaterally without bruits  Cardiac:  normal S1, S2; RRR; no murmur Lungs:  clear to auscultation bilaterally, no wheezing, rhonchi or rales  Abd: soft, nontender, no hepatomegaly  Ext: no edema Musculoskeletal:  No deformities, BUE and BLE strength normal and equal Skin: warm and dry  Neuro:  CNs 2-12 intact, no focal abnormalities noted Psych:  Normal affect   EKG:  The EKG was personally reviewed and demonstrates: NSR, no ischemia Telemetry:  Telemetry was personally reviewed and demonstrates: NSR  Relevant CV Studies:   Laboratory Data:  Chemistry Recent Labs  Lab 04/01/23 0901 04/06/23 1205  NA 132* 128*  K 4.5 5.4*  CL 99 93*  CO2 26 28  GLUCOSE 97 102*  BUN 20 22  CREATININE 0.89 0.93  CALCIUM 8.8* 8.7*  GFRNONAA >60 59*  ANIONGAP 7 7    Recent Labs  Lab 04/01/23 0901 04/06/23 1205  PROT 9.2* 8.8*  ALBUMIN 3.6 3.2*  AST 28 32  ALT 13 15  ALKPHOS 42 36*  BILITOT 0.7 0.8   Hematology Recent Labs  Lab 04/01/23 0901 04/06/23 1205  WBC 2.5* 2.9*  RBC 2.82* 2.42*  HGB 8.3* 7.0*  HCT 28.4* 23.9*  MCV 100.7* 98.8  MCH 29.4 28.9  MCHC 29.2* 29.3*  RDW 17.6* 17.7*  PLT 44* 66*   Cardiac EnzymesNo results for input(s): "TROPONINI" in the last 168 hours. No results for input(s): "TROPIPOC" in the last 168 hours.  BNPNo results for input(s): "BNP", "PROBNP" in the last 168 hours.  DDimer No results for input(s): "DDIMER" in the last 168  hours.  Radiology/Studies:  DG Chest Port 1 View  Result Date: 04/06/2023 CLINICAL DATA:  Shortness of breath EXAM: PORTABLE CHEST 1 VIEW COMPARISON:  11/03/2022 FINDINGS: Mild cardiomegaly. Aortic atherosclerosis. There is hyperinflation of the lungs compatible with COPD. Mild interstitial prominence is similar to prior study, likely chronic lung disease. Bibasilar linear densities compatible with atelectasis or scarring. No effusions. IMPRESSION: COPD/chronic changes. Cardiomegaly. Bibasilar atelectasis or scarring. Electronically Signed   By: Charlett Nose M.D.   On: 04/06/2023 12:40    Assessment and Plan:   Lisa [name] is a 89 year old F known to have paroxysmal A-fib, moderate to severe aortic valve stenosis and moderate mitral valve regurgitation by echo in 1/24, chronic diastolic heart failure, myelodysplastic syndrome (anemia and thrombocytopenia) COPD on home oxygen 2L, breast cancer s/p lumpectomy and radiation, history of lung cancer s/p LUL lobectomy presented to ER with DOE and chest pain x 6 weeks.   # NSTEMI -Presented with DOE and chest pain x 6 weeks, EKG showed NSR with no ischemia (nonspecific old T wave inversions in inferior leads) and high sensitive troponins were elevated, 319>> 325.  Echocardiogram from 1/24 showed normal LVEF, grade 3 diastolic dysfunction, moderate to severe aortic valve stenosis and mild to moderate MR.  Will obtain 2D echocardiogram in this visit. Due to history of myelo-dysplastic syndrome with chronic anemia and thrombocytopenia, Lisa [name] is not a candidate for any invasive ischemia evaluation. Not a candidate for antiplatelet or anticoagulation either. Recommend symptomatic management with isosorbide mononitrate 30 mg once daily.  # Acute on chronic diastolic heart failure exacerbation -Presenting with DOE x 6 weeks associated with chest pain.  She also had chest pain at rest. Spot dosing with IV Lasix 20 mg twice daily x 2 doses and reevaluate symptoms.  #  Moderate to severe aortic valve stenosis by echo in 1/24 # Mild to moderate mitral valve regurgitation by echo in 1/24 -  Echocardiogram from 1/24 showed moderate to severe aortic valve stenosis and mild to moderate mitral valve regurgitation. Will repeat echocardiogram to assess severity of aortic valve stenosis. Medical management recommended.  # Fusiform infrarenal abdominal aortic aneurysm, 4.8 cm per 1/24 CT imaging -Not a candidate for surgical intervention due to chronic anemia/thrombocytopenia from MDS, multiple comorbidities and advanced age. Can forego further CT imaging in the future.  # Myelodysplastic syndrome with chronic anemia and thrombocytopenia -Per primary team and oncology  I have spent a total of 75 minutes with Lisa [name] reviewing chart , telemetry, EKGs, labs and examining Lisa [name] as well as establishing an assessment and plan that was discussed with the Lisa [name].  > 50% of time was spent in direct Lisa [name] care.      For questions or updates, please contact [facility] Please consult [email or web address] for contact info under Cardiology/STEMI.   Signed, Herbert Deaner, MD 04/06/2023 4:25 PM

## 2023-04-06 NOTE — Consult Note (Signed)
@LOGO @   Referring Provider: Dr. Arbutus Leas Primary Care Physician:  [name], MD Primary Gastroenterologist:  Dr. Jena Gauss  Date of Admission: 04/06/23 Date of Consultation: 04/06/23  Reason for Consultation:  Anemia, black stool  HPI:  Lisa [name] is a 89 y.o. year old female with history of stage I adenocarcinoma of the left upper lung, stage I left breast cancer, possible CMML versus other MDS in the setting of pancytopenia following with Dr. [name], paroxysmal atrial fibrillation on aspirin, HTN, COPD on home to oxygen 2 L, HFpEF, mitral regurgitation and moderate to severe aortic valve stenosis, who presented to the emergency room today with 3-day history of generalized weakness, shortness of breath, decreased oral intake, and daughter reporting patient being a little confused.  ED course: Afebrile hemodynamically stable with oxygen saturation 95% on 2 L.  WBC 2.9, hemoglobin 7.0, platelets 66,000. Sodium 128, potassium 5.4, bicarbonate 20, serum creatinine 0.93.  Chest x-ray showed COPD/chronic changes, cardiomegaly, bibasilar atelectasis or scarring.  Troponin 319.  EKG showed sinus rhythm with nonspecific STT changes.   She was admitted with COPD exacerbation, elevated troponin, anemia, acute metabolic encephalopathy.  Cardiology was consulted by EDP who stated patient was not a candidate for aggressive intervention and recommended trending along with echocardiogram. Patient endorsed some dark stools to Dr. Arbutus Leas per review of H and P and and GI consult was ordered.  1 unit PRBCs was ordered.  Today:  No family at bedside.   Patient reports increased weakness, increased shortness of breath over the last few days.  Also noted decreased appetite over the last week, but states she has been making herself eat anyways.  Prior to this, she had a good appetite.  Denies nausea, vomiting, or any routine abdominal pain.  She does report at least 1 year history of intermittent diarrhea occurring  about once a week lasting for couple days at a time with about 3-4 small bowel movements per day.  She uses Pepto-Bismol and notes black stools during this time.  Aside from the episodes of diarrhea, she states she has normal soft, formed bowel movements daily that are normal in color.  No bright red blood per rectum.  She does report some mild abdominal discomfort when she has diarrhea that improves with a bowel movement.  No specific dietary triggers.  She does not take iron supplementation.  Denies any GERD symptoms, dysphagia, weight loss.   Denies any NSAID use.  States she takes Tylenol only.  She reports a couple month history of intermittent mild chest discomfort.  Also with chronic shortness of breath, but worsening recently.     Per chart review, hemoglobin previously holding in the 9-10 range, down to 8.3 on 5/30, and now 7.0 on 6/4.  Last office visit with Dr. [name] 5/30 noting a decline in hemoglobin and platelets.  Recommended repeat bone marrow aspiration.  This has not yet been scheduled.  She has been receiving Aranesp injections.   Last EGD 10/23/2020 with 3 cm hiatal hernia, tortuous esophagus, mildly erythematous gastric fundus and gastric body s/p biopsied, benign-appearing small nodule in the duodenum biopsied, duodenal mucosal lymphangiectasia's.  Pathology with chronic active gastritis, positive for H. pylori, benign duodenal mucosa.  H. pylori was treated with PPI twice daily, metronidazole, amoxicillin, Biaxin x 14 days.  Colonoscopy 03/06/2013: Hyperplastic polyp removed.  Past Medical History:  Diagnosis Date   Adenocarcinoma of left lung (HCC) 2006   Arthritis    Asthma    Cancer of breast, female (HCC)  Cancer of lung (HCC)    Hypertension    Hypothyroidism    Invasive ductal carcinoma of left breast (HCC) 1999   Neutropenia (HCC) 06/09/2016   Personal history of radiation therapy     Past Surgical History:  Procedure Laterality Date   BIOPSY   10/23/2020   Procedure: BIOPSY;  Surgeon: Benancio Deeds, MD;  Location: [facility] ENDOSCOPY;  Service: Gastroenterology;;   BREAST BIOPSY     left axillary node dissection   BREAST LUMPECTOMY Left    COLONOSCOPY  03/08/2003   UEA:[id number] rectal polyps destroyed with the tip of the snare/Polyps at hepatic flexure, splenic flexure at 35 cm/Left-sided diverticula: unable to retrieve path   COLONOSCOPY  09/12/2008   NWG:NFAOZH rectum and distal sigmoid diminutive polyps/scattered left sided diverticulum. hyperplastic   COLONOSCOPY N/A 03/06/2013   YQM:VHQIONG polyp-removed as described above; colonic diverticulosis. hyperplastic polyps. next TCS 03/2018   ESOPHAGOGASTRODUODENOSCOPY (EGD) WITH PROPOFOL N/A 10/23/2020   Procedure: ESOPHAGOGASTRODUODENOSCOPY (EGD) WITH PROPOFOL;  Surgeon: Benancio Deeds, MD;  Location: [facility] ENDOSCOPY;  Service: Gastroenterology;  Laterality: N/A;   FOOT SURGERY     LUNG REMOVAL, PARTIAL     upper lobe    Prior to Admission medications   Medication Sig Start Date End Date Taking? Authorizing Provider  acetaminophen (TYLENOL) 325 MG tablet Take 2 tablets (650 mg total) by mouth every 6 (six) hours. 11/11/20  Yes Angiulli, Mcarthur Rossetti, PA-C  albuterol (VENTOLIN HFA) 108 (90 Base) MCG/ACT inhaler Inhale 1-2 puffs into the lungs every 4 (four) hours as needed for wheezing or shortness of breath. 05/25/22  Yes [provider]  Fluticasone-Umeclidin-Vilant (TRELEGY ELLIPTA) 100-62.5-25 MCG/ACT AEPB Inhale 1 puff into the lungs daily. 12/18/22  Yes Oretha Milch, MD  furosemide (LASIX) 40 MG tablet Take 1 tablet (40 mg total) by mouth daily. 01/06/23  Yes Mallipeddi, Vishnu P, MD  gabapentin (NEURONTIN) 100 MG capsule Take 1 capsule (100 mg total) by mouth every 12 (twelve) hours as needed (neuropathy). 02/11/22 04/06/23 Yes Vassie Loll, MD  levothyroxine (SYNTHROID) 100 MCG tablet Take 1 tablet (100 mcg total) by mouth daily before breakfast. 08/28/21  Yes Johnson,  Clanford L, MD  LOKELMA 10 g PACK packet Take 10 g by mouth daily. 01/06/23  Yes [provider]  magnesium oxide (MAG-OX) 400 MG tablet Take 400 mg by mouth 2 (two) times daily.   Yes [provider]  metoprolol succinate (TOPROL-XL) 25 MG 24 hr tablet TAKE 1 TABLET DAILY 02/18/23  Yes Mallipeddi, Vishnu P, MD  mirtazapine (REMERON) 7.5 MG tablet Take 1 tablet (7.5 mg total) by mouth at bedtime. 05/28/21  Yes Artis Delay, MD  Multiple Vitamin (MULTI-VITAMIN) tablet Take 1 tablet by mouth daily.   Yes [provider]  pantoprazole (PROTONIX) 40 MG tablet Take 1 tablet (40 mg total) by mouth daily. 11/08/22  Yes Vassie Loll, MD  aspirin EC 81 MG EC tablet Take 1 tablet (81 mg total) by mouth daily. Swallow whole. Patient not taking: Reported on 04/06/2023 02/12/22   Vassie Loll, MD    Current Facility-Administered Medications  Medication Dose Route Frequency Provider Last Rate Last Admin   0.9 %  sodium chloride infusion (Manually program via Guardrails IV Fluids)   Intravenous Once Bethann Berkshire, MD       calcium gluconate 1 g/ 50 mL sodium chloride IVPB  1 g Intravenous Once Tat, David, MD       isosorbide mononitrate (IMDUR) 24 hr tablet 30 mg  30 mg  Oral Daily Mallipeddi, Vishnu P, MD       sodium bicarbonate injection 50 mEq  50 mEq Intravenous Once Tat, David, MD       sodium zirconium cyclosilicate (LOKELMA) packet 10 g  10 g Oral Once Tat, David, MD       Current Outpatient Medications  Medication Sig Dispense Refill   acetaminophen (TYLENOL) 325 MG tablet Take 2 tablets (650 mg total) by mouth every 6 (six) hours.     albuterol (VENTOLIN HFA) 108 (90 Base) MCG/ACT inhaler Inhale 1-2 puffs into the lungs every 4 (four) hours as needed for wheezing or shortness of breath.     Fluticasone-Umeclidin-Vilant (TRELEGY ELLIPTA) 100-62.5-25 MCG/ACT AEPB Inhale 1 puff into the lungs daily. 28 each 0   furosemide (LASIX) 40 MG tablet Take 1 tablet (40 mg total) by mouth  daily. 90 tablet 2   gabapentin (NEURONTIN) 100 MG capsule Take 1 capsule (100 mg total) by mouth every 12 (twelve) hours as needed (neuropathy). 60 capsule 1   levothyroxine (SYNTHROID) 100 MCG tablet Take 1 tablet (100 mcg total) by mouth daily before breakfast. 30 tablet 1   LOKELMA 10 g PACK packet Take 10 g by mouth daily.     magnesium oxide (MAG-OX) 400 MG tablet Take 400 mg by mouth 2 (two) times daily.     metoprolol succinate (TOPROL-XL) 25 MG 24 hr tablet TAKE 1 TABLET DAILY 90 tablet 1   mirtazapine (REMERON) 7.5 MG tablet Take 1 tablet (7.5 mg total) by mouth at bedtime. 30 tablet 2   Multiple Vitamin (MULTI-VITAMIN) tablet Take 1 tablet by mouth daily.     pantoprazole (PROTONIX) 40 MG tablet Take 1 tablet (40 mg total) by mouth daily. 30 tablet 1   aspirin EC 81 MG EC tablet Take 1 tablet (81 mg total) by mouth daily. Swallow whole. (Patient not taking: Reported on 04/06/2023) 30 tablet 11    Allergies as of 04/06/2023 - Review Complete 04/06/2023  Allergen Reaction Noted   Meloxicam Other (See Comments) 10/21/2020   Micardis hct [telmisartan-hctz] Other (See Comments) 10/21/2020    Family History  Problem Relation Age of Onset   Cancer Mother    Cancer Sister    Colon cancer Neg Hx     Social History   Socioeconomic History   Marital status: Widowed    Spouse name: Not on file   Number of children: Not on file   Years of education: Not on file   Highest education level: Not on file  Occupational History   Not on file  Tobacco Use   Smoking status: Former    Packs/day: 0.75    Years: 35.00    Additional pack years: 0.00    Total pack years: 26.25    Types: Cigarettes    Quit date: 85    Years since quitting: 31.4   Smokeless tobacco: Never   Tobacco comments:    smoke-free X 30 yeras  Vaping Use   Vaping Use: Never used  Substance and Sexual Activity   Alcohol use: No   Drug use: No   Sexual activity: Not Currently  Other Topics Concern   Not on  file  Social History Narrative   Not on file   Social Determinants of Health   Financial Resource Strain: Not on file  Food Insecurity: No Food Insecurity (01/15/2023)   Hunger Vital Sign    Worried About Running Out of Food in the Last Year: Never true    Ran  Out of Food in the Last Year: Never true  Transportation Needs: No Transportation Needs (01/15/2023)   PRAPARE - Administrator, Civil Service (Medical): No    Lack of Transportation (Non-Medical): No  Physical Activity: Inactive (12/12/2020)   Exercise Vital Sign    Days of Exercise per Week: 0 days    Minutes of Exercise per Session: 0 min  Stress: Not on file  Social Connections: Not on file  Intimate Partner Violence: Not At Risk (01/15/2023)   Humiliation, Afraid, Rape, and Kick questionnaire    Fear of Current or Ex-Partner: No    Emotionally Abused: No    Physically Abused: No    Sexually Abused: No    Review of Systems: Gen: Denies fever, chills, cold or flulike symptoms, presyncope, syncope. CV: Admits to chest pain.  No heart palpitations. Resp: Admits to chronic shortness of breath with some worsening recently. GI: See HPI GU : Denies urinary burning, urinary frequency, urinary incontinence.  MS: Denies joint pain. Derm: Denies rash. Psych: Denies depression, anxiety. Heme: See HPI  Physical Exam: Vital signs in last 24 hours: Temp:  [97.9 F (36.6 C)-98.7 F (37.1 C)] 97.9 F (36.6 C) (06/04 1425) Pulse Rate:  [72-76] 74 (06/04 1425) Resp:  [16-34] 28 (06/04 1425) BP: (130-149)/(65-99) 149/81 (06/04 1425) SpO2:  [94 %-95 %] 95 % (06/04 1425) Weight:  [69 kg] 69 kg (06/04 1142)   General:   Alert,  Well-developed, well-nourished, pleasant and cooperative in NAD Head:  Normocephalic and atraumatic. Eyes:  Sclera clear, no icterus.   Conjunctiva pink. Ears:  Normal auditory acuity. Lungs:  Slight wheezing and crackles in the bases. No acute distress. Nasal canula in place on 2L.  Heart:   Regular rate and rhythm. Systolic murmer appreciated.  Abdomen:  Soft, nontender and nondistended. No masses, hepatosplenomegaly or hernias noted. Normal bowel sounds, without guarding, and without rebound.   Rectal:  Deferred Msk:  Symmetrical without gross deformities. Normal posture. Extremities:  Without edema. Neurologic:  Alert and  oriented x4;  grossly normal neurologically. Skin:  Intact without significant lesions or rashes. Psych:  Normal mood and affect.  Intake/Output from previous day: No intake/output data recorded. Intake/Output this shift: No intake/output data recorded.  Lab Results: Recent Labs    04/06/23 1205  WBC 2.9*  HGB 7.0*  HCT 23.9*  PLT 66*   BMET Recent Labs    04/06/23 1205  NA 128*  K 5.4*  CL 93*  CO2 28  GLUCOSE 102*  BUN 22  CREATININE 0.93  CALCIUM 8.7*   LFT Recent Labs    04/06/23 1205  PROT 8.8*  ALBUMIN 3.2*  AST 32  ALT 15  ALKPHOS 36*  BILITOT 0.8    Studies/Results: DG Chest Port 1 View  Result Date: 04/06/2023 CLINICAL DATA:  Shortness of breath EXAM: PORTABLE CHEST 1 VIEW COMPARISON:  11/03/2022 FINDINGS: Mild cardiomegaly. Aortic atherosclerosis. There is hyperinflation of the lungs compatible with COPD. Mild interstitial prominence is similar to prior study, likely chronic lung disease. Bibasilar linear densities compatible with atelectasis or scarring. No effusions. IMPRESSION: COPD/chronic changes. Cardiomegaly. Bibasilar atelectasis or scarring. Electronically Signed   By: Charlett Nose M.D.   On: 04/06/2023 12:40    Impression: 89 y.o. year old female with history of stage I adenocarcinoma of the left upper lung, stage I left breast cancer, possible CMML versus other MDS in the setting of pancytopenia following with Dr. [name], paroxysmal atrial fibrillation on aspirin, HTN, COPD  on home to oxygen 2 L, HFpEF, mitral regurgitation and moderate to severe aortic valve stenosis, TIA with prior MRI demonstrating  multiple vessel occlusion and high-grade stenosis in 2023, who presented to the emergency room today with 3-day history of generalized weakness, shortness of breath, decreased oral intake, and daughter reporting patient being a little confused, now admitted with COPD exacerbation, elevated troponin, anemia, acute metabolic encephalopathy.   Acute on chronic symptomatic anemia with reported black stool:  Chronic history of anemia in the setting of CMML versus other MDS following with Dr. [name] and receiving Aranesp.  Hemoglobin previously holding in the 9-10 range, but down to 8.3 on 5/30.  Dr. [name] had recommended bone marrow biopsy, but this has not yet been scheduled.  Hemoglobin down to 7.0 today.  She reports intermittent black stools occurring about once a week when she has diarrhea at which time she takes Pepto-Bismol.  She did have black stool today as she is in a spell of diarrhea and took pepto. She denies NSAID use.  She is not anticoagulated due to MDS/thrombocytopenia. Denies any other significant GI symptoms.  She does have history of H. pylori noted on EGD in December 2021 s/p treatment with metronidazole, amoxicillin, Biaxin x 14 days.  I do not see any documented eradication.  Also with history of hyperplastic polyp removed from the colon in 2014.  Suspect Pepto-Bismol is likely the etiology of black stool, but unable to rule out true melena.  Suspect primary etiology of declining hemoglobin is more likely related to her underlying MDS, but again unable to rule out occult GI bleeding.    I recommended obtaining Hemoccult for further evaluation.  Agree with transfusing 1 unit PRBCs as already ordered by hospitalist.  Holding off on EGD for now in the setting of elevated troponin and pending echocardiogram.  If EGD is considered, would need cardiac clearance.  Additionally, not sure patient would be an appropriate candidate for EGD unless absolutely necessary considering multiple  comorbidities.   Diarrhea: Chronic intermittent diarrhea likely secondary to IBS. Responds well to pepto bismol as needed.   COPD exacerbation:  Management per hospitalist.  Currently O2 saturation in the 90s on 2 L O2 which is her baseline.  She has been ordered Pulmicort, Brovana, Solu-Medrol, DuoNebs.  Elevated Troponin:  Per H&P, ED provider consulted cardiology.  Plan is to trend, pursue echocardiogram.  Patient is not a candidate for aggressive intervention.     Plan: Agree with 1 unit PRBCs. Hemoccult IV PPI twice daily for now.  Monitor for overt bleeding. Monitor H&H close.  Transfuse further as necessary. Holding off on EGD for now pending cardiac work-up and improvement from cardiopulmonary standpoint.  Will need to discuss further with Dr. Jena Gauss; however, in the setting of elevated troponin, multiple comorbidities, patient likely not a good candidate for endoscopy.     LOS: 0 days    04/06/2023, 3:00 PM   Ermalinda Memos, [facility]

## 2023-04-06 NOTE — H&P (Addendum)
History and Physical    Patient: Lisa [name] WJX:[id number] DOB: 01/05/1934 DOA: 04/06/2023 DOS: the patient was seen and examined on 04/06/2023 PCP: [name], MD  Patient coming from: Home  Chief Complaint:  Chief Complaint  Patient presents with   Fatigue   HPI: Lisa [name] is a 89 year old female with a history of myelodysplastic syndrome, COPD, hypertension, chronic respiratory failure on 2 L, paroxysmal atrial fibrillation on aspirin, lung adenocarcinoma status post LUL lobectomy, breast cancer, and HFpEF presenting with 3-day history of generalized weakness, shortness of breath, and decreased oral intake.  The patient's daughter at the bedside also relates that she has been a little bit confused.  The patient states that she has had more more difficulty getting out of bed and getting back into bed secondary to generalized weakness.  She has had some subjective fevers and chills.  She states that she has had more dyspnea on exertion.  She has also been complaining of intermittent chest discomfort with exertion over the past month.  She denies any headache, neck pain, nausea, vomiting, diarrhea.  She has had some loose stools without any hematochezia.  She has had some dark stools.  She denies any hematemesis.  She denies any dysuria or hematuria.  Daughter states that the patient has also had decreased oral intake.  Notably, the patient recently saw Dr. [name] in the office on 04/01/2023.  There are plans for bone marrow biopsy to be done.  Because of the above symptoms, she presented for further evaluation and treatment. In the ED, the patient was afebrile hemodynamically stable with oxygen saturation 95% on 2 L.  WBC 2.9, hemoglobin 10.0, platelets 66,000.  Sodium 128, potassium 5.4, bicarbonate 20, serum creatinine 0.93.  Chest x-ray showed chronic interstitial markings with hyperinflation.  Troponin 319.  EKG showed sinus rhythm with nonspecific STT changes.   Review of Systems:  As mentioned in the history of present illness. All other systems reviewed and are negative. Past Medical History:  Diagnosis Date   Adenocarcinoma of left lung (HCC) 2006   Arthritis    Asthma    Cancer of breast, female (HCC)    Cancer of lung (HCC)    Hypertension    Hypothyroidism    Invasive ductal carcinoma of left breast (HCC) 1999   Neutropenia (HCC) 06/09/2016   Personal history of radiation therapy    Past Surgical History:  Procedure Laterality Date   BIOPSY  10/23/2020   Procedure: BIOPSY;  Surgeon: Benancio Deeds, MD;  Location: [facility] ENDOSCOPY;  Service: Gastroenterology;;   BREAST BIOPSY     left axillary node dissection   BREAST LUMPECTOMY Left    COLONOSCOPY  03/08/2003   OZH:[id number] rectal polyps destroyed with the tip of the snare/Polyps at hepatic flexure, splenic flexure at 35 cm/Left-sided diverticula: unable to retrieve path   COLONOSCOPY  09/12/2008   EXB:MWUXLK rectum and distal sigmoid diminutive polyps/scattered left sided diverticulum. hyperplastic   COLONOSCOPY N/A 03/06/2013   GMW:NUUVOZD polyp-removed as described above; colonic diverticulosis. hyperplastic polyps. next TCS 03/2018   ESOPHAGOGASTRODUODENOSCOPY (EGD) WITH PROPOFOL N/A 10/23/2020   Procedure: ESOPHAGOGASTRODUODENOSCOPY (EGD) WITH PROPOFOL;  Surgeon: Benancio Deeds, MD;  Location: [facility] ENDOSCOPY;  Service: Gastroenterology;  Laterality: N/A;   FOOT SURGERY     LUNG REMOVAL, PARTIAL     upper lobe   Social History:  reports that she quit smoking about 31 years ago. Her smoking use included cigarettes. She has a 26.25 pack-year smoking history. She has never used  smokeless tobacco. She reports that she does not drink alcohol and does not use drugs.  Allergies  Allergen Reactions   Meloxicam Other (See Comments)    Caused an injury to the kidneys, per nephrologist   Micardis Hct [Telmisartan-Hctz] Other (See Comments)    Caused an injury to the kidneys, per nephrologist     Family History  Problem Relation Age of Onset   Cancer Mother    Cancer Sister    Colon cancer Neg Hx     Prior to Admission medications   Medication Sig Start Date End Date Taking? Authorizing Provider  acetaminophen (TYLENOL) 325 MG tablet Take 2 tablets (650 mg total) by mouth every 6 (six) hours. 11/11/20  Yes Angiulli, Mcarthur Rossetti, PA-C  albuterol (VENTOLIN HFA) 108 (90 Base) MCG/ACT inhaler Inhale 1-2 puffs into the lungs every 4 (four) hours as needed for wheezing or shortness of breath. 05/25/22  Yes [provider]  Fluticasone-Umeclidin-Vilant (TRELEGY ELLIPTA) 100-62.5-25 MCG/ACT AEPB Inhale 1 puff into the lungs daily. 12/18/22  Yes Oretha Milch, MD  furosemide (LASIX) 40 MG tablet Take 1 tablet (40 mg total) by mouth daily. 01/06/23  Yes Mallipeddi, Vishnu P, MD  gabapentin (NEURONTIN) 100 MG capsule Take 1 capsule (100 mg total) by mouth every 12 (twelve) hours as needed (neuropathy). 02/11/22 04/06/23 Yes Vassie Loll, MD  levothyroxine (SYNTHROID) 100 MCG tablet Take 1 tablet (100 mcg total) by mouth daily before breakfast. 08/28/21  Yes Johnson, Clanford L, MD  LOKELMA 10 g PACK packet Take 10 g by mouth daily. 01/06/23  Yes [provider]  magnesium oxide (MAG-OX) 400 MG tablet Take 400 mg by mouth 2 (two) times daily.   Yes [provider]  metoprolol succinate (TOPROL-XL) 25 MG 24 hr tablet TAKE 1 TABLET DAILY 02/18/23  Yes Mallipeddi, Vishnu P, MD  mirtazapine (REMERON) 7.5 MG tablet Take 1 tablet (7.5 mg total) by mouth at bedtime. 05/28/21  Yes Artis Delay, MD  Multiple Vitamin (MULTI-VITAMIN) tablet Take 1 tablet by mouth daily.   Yes [provider]  pantoprazole (PROTONIX) 40 MG tablet Take 1 tablet (40 mg total) by mouth daily. 11/08/22  Yes Vassie Loll, MD  aspirin EC 81 MG EC tablet Take 1 tablet (81 mg total) by mouth daily. Swallow whole. Patient not taking: Reported on 04/06/2023 02/12/22   Vassie Loll, MD    Physical  Exam: Vitals:   04/06/23 1142 04/06/23 1230 04/06/23 1425  BP: 134/65 (!) 130/99 (!) 149/81  Pulse: 76 72 74  Resp: 16 (!) 34 (!) 28  Temp: 98.7 F (37.1 C)  97.9 F (36.6 C)  TempSrc: Oral    SpO2: 94% 95% 95%  Weight: 69 kg    Height: 5\' 5"  (1.651 m)     GENERAL:  A&O x 2, NAD, well developed, cooperative, follows commands HEENT: [place]/AT, No thrush, No icterus, No oral ulcers Neck:  No neck mass, No meningismus, soft, supple CV: RRR, no S3, no S4, no rub, no JVD Lungs: Scattered bilateral rales.  Bilateral wheezing. Abd: soft/NT +BS, nondistended Ext: No edema, no lymphangitis, no cyanosis, no rashes Neuro:  CN II-XII intact, strength 4/5 in RUE, RLE, strength 4/5 LUE, LLE; sensation intact bilateral; no dysmetria; babinski equivocal  Data Reviewed: Data reviewed above in the history Assessment and Plan: COPD exacerbation -Start Pulmicort -Start Brovana -start duonebs -start solumedrol -Viral respiratory panel -Check COVID-19  Elevated troponin -EDP has consulted cardiology -trend -echo -not a candidate for aggressive intervention  ABLA/Melena -  GI consult -Hemoglobin has trended down from 9.7 on 02/12/23 to 7.0 -Transfuse 1 units PRBC  Acute metabolic encephalopathy -Multifactorial including blood loss anemia, hyponatremia, dehydration, possible infectious process -UA -B12 -TSH -Check ammonia  Chronic HFpEF -clinically euvolemic -11/04/22 Echo EF 65-70%, no WMA, G2DD  Chronic respiratory failure with hypoxia -Chronically on 2 L at home  Thrombocytopenia/leukopenia -This has been chronic likely secondary to the patient's myelodysplastic syndrome -There are plans for bone marrow biopsy per Dr. [name]  Paroxysmal Atrial fib -currently in sinus -Continue metoprolol succinate -not a candidate for anticoagulation due to MDS and chronic anemia  Hyperkalemia -Lokelma ordered -Treated with temporizing measures  Hyponatremia -Secondary to volume  depletion and poor solute intake -Judicious IV fluids      Advance Care Planning: FULL  Consults: cardiology  Family Communication: daughter updated 6/4  Severity of Illness: The appropriate patient status for this patient is OBSERVATION. Observation status is judged to be reasonable and necessary in order to provide the required intensity of service to ensure the patient's safety. The patient's presenting symptoms, physical exam findings, and initial radiographic and laboratory data in the context of their medical condition is felt to place them at decreased risk for further clinical deterioration. Furthermore, it is anticipated that the patient will be medically stable for discharge from the hospital within 2 midnights of admission.   Author: Catarina Hartshorn, MD 04/06/2023 2:31 PM  For on call review [email or web address].

## 2023-04-06 NOTE — Telephone Encounter (Signed)
Daughter called to advise that patient is weak, high blood pressure with systolic > 200, decreased appetite, confusion and is combative.  This has progressed over the last 48 hours.  Instructed that she go to the ER for evaluation.  Verbalized understanding.

## 2023-04-07 ENCOUNTER — Inpatient Hospital Stay (HOSPITAL_COMMUNITY): Payer: Medicare Other

## 2023-04-07 DIAGNOSIS — Z7951 Long term (current) use of inhaled steroids: Secondary | ICD-10-CM | POA: Diagnosis not present

## 2023-04-07 DIAGNOSIS — E871 Hypo-osmolality and hyponatremia: Secondary | ICD-10-CM | POA: Diagnosis present

## 2023-04-07 DIAGNOSIS — Z79899 Other long term (current) drug therapy: Secondary | ICD-10-CM | POA: Diagnosis not present

## 2023-04-07 DIAGNOSIS — E860 Dehydration: Secondary | ICD-10-CM | POA: Diagnosis present

## 2023-04-07 DIAGNOSIS — I080 Rheumatic disorders of both mitral and aortic valves: Secondary | ICD-10-CM | POA: Diagnosis present

## 2023-04-07 DIAGNOSIS — I5033 Acute on chronic diastolic (congestive) heart failure: Secondary | ICD-10-CM | POA: Diagnosis present

## 2023-04-07 DIAGNOSIS — R7989 Other specified abnormal findings of blood chemistry: Secondary | ICD-10-CM

## 2023-04-07 DIAGNOSIS — I480 Paroxysmal atrial fibrillation: Secondary | ICD-10-CM | POA: Diagnosis present

## 2023-04-07 DIAGNOSIS — I340 Nonrheumatic mitral (valve) insufficiency: Secondary | ICD-10-CM | POA: Diagnosis not present

## 2023-04-07 DIAGNOSIS — R197 Diarrhea, unspecified: Secondary | ICD-10-CM | POA: Diagnosis not present

## 2023-04-07 DIAGNOSIS — K580 Irritable bowel syndrome with diarrhea: Secondary | ICD-10-CM | POA: Diagnosis present

## 2023-04-07 DIAGNOSIS — D649 Anemia, unspecified: Secondary | ICD-10-CM | POA: Diagnosis not present

## 2023-04-07 DIAGNOSIS — E875 Hyperkalemia: Secondary | ICD-10-CM | POA: Diagnosis present

## 2023-04-07 DIAGNOSIS — Z79890 Hormone replacement therapy (postmenopausal): Secondary | ICD-10-CM | POA: Diagnosis not present

## 2023-04-07 DIAGNOSIS — G9341 Metabolic encephalopathy: Secondary | ICD-10-CM | POA: Diagnosis present

## 2023-04-07 DIAGNOSIS — D61818 Other pancytopenia: Secondary | ICD-10-CM | POA: Diagnosis present

## 2023-04-07 DIAGNOSIS — Z9981 Dependence on supplemental oxygen: Secondary | ICD-10-CM | POA: Diagnosis not present

## 2023-04-07 DIAGNOSIS — Z7982 Long term (current) use of aspirin: Secondary | ICD-10-CM | POA: Diagnosis not present

## 2023-04-07 DIAGNOSIS — I7143 Infrarenal abdominal aortic aneurysm, without rupture: Secondary | ICD-10-CM | POA: Diagnosis present

## 2023-04-07 DIAGNOSIS — D62 Acute posthemorrhagic anemia: Secondary | ICD-10-CM | POA: Diagnosis present

## 2023-04-07 DIAGNOSIS — I214 Non-ST elevation (NSTEMI) myocardial infarction: Secondary | ICD-10-CM | POA: Diagnosis present

## 2023-04-07 DIAGNOSIS — I350 Nonrheumatic aortic (valve) stenosis: Secondary | ICD-10-CM | POA: Diagnosis not present

## 2023-04-07 DIAGNOSIS — E039 Hypothyroidism, unspecified: Secondary | ICD-10-CM | POA: Diagnosis present

## 2023-04-07 DIAGNOSIS — J9611 Chronic respiratory failure with hypoxia: Secondary | ICD-10-CM | POA: Diagnosis present

## 2023-04-07 DIAGNOSIS — D630 Anemia in neoplastic disease: Secondary | ICD-10-CM | POA: Diagnosis present

## 2023-04-07 DIAGNOSIS — I110 Hypertensive heart disease with heart failure: Secondary | ICD-10-CM | POA: Diagnosis present

## 2023-04-07 DIAGNOSIS — D469 Myelodysplastic syndrome, unspecified: Secondary | ICD-10-CM | POA: Diagnosis present

## 2023-04-07 DIAGNOSIS — J441 Chronic obstructive pulmonary disease with (acute) exacerbation: Secondary | ICD-10-CM | POA: Diagnosis present

## 2023-04-07 LAB — PROTEIN ELECTROPHORESIS, SERUM
A/G Ratio: 0.6 — ABNORMAL LOW (ref 0.7–1.7)
Albumin ELP: 3.5 g/dL (ref 2.9–4.4)
Alpha-1-Globulin: 0.3 g/dL (ref 0.0–0.4)
Alpha-2-Globulin: 0.6 g/dL (ref 0.4–1.0)
Beta Globulin: 1.2 g/dL (ref 0.7–1.3)
Gamma Globulin: 3.5 g/dL — ABNORMAL HIGH (ref 0.4–1.8)
Globulin, Total: 5.5 g/dL — ABNORMAL HIGH (ref 2.2–3.9)
Total Protein ELP: 9 g/dL — ABNORMAL HIGH (ref 6.0–8.5)

## 2023-04-07 LAB — ECHOCARDIOGRAM COMPLETE
AR max vel: 0.59 cm2
AV Area VTI: 0.61 cm2
AV Area mean vel: 0.57 cm2
AV Mean grad: 31 mmHg
AV Peak grad: 50.1 mmHg
Ao pk vel: 3.54 m/s
Area-P 1/2: 5.13 cm2
Height: 64 in
MV M vel: 6.13 m/s
MV Peak grad: 150.1 mmHg
MV VTI: 1.27 cm2
S' Lateral: 2.8 cm
Weight: 2368 oz

## 2023-04-07 LAB — RESPIRATORY PANEL BY PCR

## 2023-04-07 LAB — URINE CULTURE

## 2023-04-07 LAB — CBC
HCT: 25 % — ABNORMAL LOW (ref 36.0–46.0)
Hemoglobin: 7.7 g/dL — ABNORMAL LOW (ref 12.0–15.0)
MCH: 29.2 pg (ref 26.0–34.0)
MCHC: 30.8 g/dL (ref 30.0–36.0)
MCV: 94.7 fL (ref 80.0–100.0)
Platelets: 64 10*3/uL — ABNORMAL LOW (ref 150–400)
RBC: 2.64 MIL/uL — ABNORMAL LOW (ref 3.87–5.11)
RDW: 18.4 % — ABNORMAL HIGH (ref 11.5–15.5)
WBC: 1.5 10*3/uL — ABNORMAL LOW (ref 4.0–10.5)
nRBC: 7.6 % — ABNORMAL HIGH (ref 0.0–0.2)

## 2023-04-07 LAB — TYPE AND SCREEN: Antibody Screen: NEGATIVE

## 2023-04-07 MED ORDER — METHYLPREDNISOLONE SODIUM SUCC 40 MG IJ SOLR
40.0000 mg | Freq: Two times a day (BID) | INTRAMUSCULAR | Status: DC
  Administered 2023-04-08: 40 mg via INTRAVENOUS
  Filled 2023-04-07: qty 1

## 2023-04-07 MED ORDER — FUROSEMIDE 10 MG/ML IJ SOLN
20.0000 mg | Freq: Two times a day (BID) | INTRAMUSCULAR | Status: DC
  Administered 2023-04-07 – 2023-04-08 (×4): 20 mg via INTRAVENOUS
  Filled 2023-04-07 (×5): qty 2

## 2023-04-07 NOTE — Plan of Care (Signed)
  Problem: Acute Rehab PT Goals(only PT should resolve) Goal: Pt Will Go Supine/Side To Sit Outcome: Progressing Flowsheets (Taken 04/07/2023 1340) Pt will go Supine/Side to Sit:  with modified independence  with supervision Goal: Patient Will Transfer Sit To/From Stand Outcome: Progressing Flowsheets (Taken 04/07/2023 1340) Patient will transfer sit to/from stand:  with modified independence  with supervision Goal: Pt Will Transfer Bed To Chair/Chair To Bed Outcome: Progressing Flowsheets (Taken 04/07/2023 1340) Pt will Transfer Bed to Chair/Chair to Bed:  with modified independence  with supervision Goal: Pt Will Ambulate Outcome: Progressing Flowsheets (Taken 04/07/2023 1340) Pt will Ambulate:  50 feet  with min guard assist  with minimal assist  with rolling walker   1:41 PM, 04/07/23 Ocie Bob, MPT Physical Therapist with [facility] [phone] office [phone] mobile phone

## 2023-04-07 NOTE — Progress Notes (Signed)
Progress Note  Patient Name: Lisa [name] Date of Encounter: 04/07/2023  Primary Cardiologist: Marjo Bicker, MD  Subjective   No acute events overnight.  No symptoms and SOB improving with breathing treatment.  Inpatient Medications    Scheduled Meds:  arformoterol  15 mcg Nebulization BID   budesonide (PULMICORT) nebulizer solution  0.5 mg Nebulization BID   furosemide  20 mg Intravenous BID   ipratropium-albuterol  3 mL Nebulization Q6H   isosorbide mononitrate  30 mg Oral Daily   levothyroxine  100 mcg Oral QAC breakfast   magnesium oxide  400 mg Oral BID   methylPREDNISolone (SOLU-MEDROL) injection  60 mg Intravenous Q12H   metoprolol succinate  25 mg Oral Daily   mirtazapine  7.5 mg Oral QHS   multivitamin with minerals  1 tablet Oral Daily   pantoprazole (PROTONIX) IV  40 mg Intravenous Q12H   sodium zirconium cyclosilicate  10 g Oral Daily   Continuous Infusions:  sodium chloride 75 mL/hr at 04/07/23 0654   PRN Meds: acetaminophen **OR** acetaminophen, gabapentin, ondansetron **OR** ondansetron (ZOFRAN) IV   Vital Signs    Vitals:   04/06/23 2051 04/07/23 0159 04/07/23 0359 04/07/23 0705  BP: (!) 174/84  (!) 150/81   Pulse: 78  92   Resp: (!) 24  19   Temp: 98.2 F (36.8 C)  98.4 F (36.9 C)   TempSrc: Oral  Oral   SpO2: 100% 97% 99% 99%  Weight:      Height:        Intake/Output Summary (Last 24 hours) at 04/07/2023 1131 Last data filed at 04/07/2023 0900 Gross per 24 hour  Intake 1467.33 ml  Output 1100 ml  Net 367.33 ml   Filed Weights   04/06/23 1142 04/06/23 1500  Weight: 69 kg 67.1 kg    Telemetry     Personally reviewed.  ECG    Not performed today  Physical Exam   GEN: No acute distress.   Neck: JVD not examined as patient is sitting upright Cardiac: RRR, no murmur, rub, or gallop.  Respiratory: Nonlabored. Clear to auscultation bilaterally. GI: Soft, nontender, bowel sounds present. MS: No edema; No  deformity. Neuro:  Nonfocal. Psych: Alert and oriented x 3. Normal affect.  Labs    Chemistry Recent Labs  Lab 04/01/23 0901 04/06/23 1205  NA 132* 128*  K 4.5 5.4*  CL 99 93*  CO2 26 28  GLUCOSE 97 102*  BUN 20 22  CREATININE 0.89 0.93  CALCIUM 8.8* 8.7*  PROT 9.2* 8.8*  ALBUMIN 3.6 3.2*  AST 28 32  ALT 13 15  ALKPHOS 42 36*  BILITOT 0.7 0.8  GFRNONAA >60 59*  ANIONGAP 7 7     Hematology Recent Labs  Lab 04/01/23 0901 04/06/23 1205 04/07/23 0430  WBC 2.5* 2.9* 1.5*  RBC 2.82* 2.42* 2.64*  HGB 8.3* 7.0* 7.7*  HCT 28.4* 23.9* 25.0*  MCV 100.7* 98.8 94.7  MCH 29.4 28.9 29.2  MCHC 29.2* 29.3* 30.8  RDW 17.6* 17.7* 18.4*  PLT 44* 66* 64*    Cardiac Enzymes Recent Labs  Lab 04/06/23 1205 04/06/23 1351  TROPONINIHS 319* 325*    BNPNo results for input(s): "BNP", "PROBNP" in the last 168 hours.   DDimerNo results for input(s): "DDIMER" in the last 168 hours.   Radiology    DG Chest Port 1 View  Result Date: 04/06/2023 CLINICAL DATA:  Shortness of breath EXAM: PORTABLE CHEST 1 VIEW COMPARISON:  11/03/2022 FINDINGS: Mild cardiomegaly. Aortic  atherosclerosis. There is hyperinflation of the lungs compatible with COPD. Mild interstitial prominence is similar to prior study, likely chronic lung disease. Bibasilar linear densities compatible with atelectasis or scarring. No effusions. IMPRESSION: COPD/chronic changes. Cardiomegaly. Bibasilar atelectasis or scarring. Electronically Signed   By: Charlett Nose M.D.   On: 04/06/2023 12:40     Assessment & Plan   Patient is a 89 year old F known to have paroxysmal A-fib, moderate to severe aortic valve stenosis and moderate mitral valve regurgitation by echo in 1/24, chronic diastolic heart failure, myelodysplastic syndrome (anemia and thrombocytopenia) COPD on home oxygen 2L, breast cancer s/p lumpectomy and radiation, history of lung cancer s/p LUL lobectomy presented to ER with DOE and chest pain x 6 weeks.    #  NSTEMI -Presented with DOE and chest pain x 6 weeks, EKG showed NSR with no ischemia (nonspecific old T wave inversions in inferior leads) and high sensitive troponins were elevated, 319>> 325.  Echocardiogram from 1/24 showed normal LVEF, grade 3 diastolic dysfunction, moderate to severe aortic valve stenosis and mild to moderate MR.  Will obtain 2D echocardiogram in this visit. Due to history of myelo-dysplastic syndrome with chronic anemia and thrombocytopenia, patient is not a candidate for any invasive ischemia evaluation. Not a candidate for antiplatelet or anticoagulation either. Recommend symptomatic management with isosorbide mononitrate 30 mg once daily.   # Acute on chronic diastolic heart failure exacerbation -Presenting with DOE x 6 weeks associated with chest pain.  She also had chest pain at rest. Start IV Lasix 20 mg daily.   # Moderate to severe aortic valve stenosis by echo in 1/24 # Mild to moderate mitral valve regurgitation by echo in 1/24 -Echocardiogram from 1/24 showed moderate to severe aortic valve stenosis and mild to moderate mitral valve regurgitation. Will repeat echocardiogram to assess severity of aortic valve stenosis. Medical management recommended.   # Fusiform infrarenal abdominal aortic aneurysm, 4.8 cm per 1/24 CT imaging -Not a candidate for surgical intervention due to chronic anemia/thrombocytopenia from MDS, multiple comorbidities and advanced age. Can forego further CT imaging in the future.   # Myelodysplastic syndrome with chronic anemia and thrombocytopenia -Per primary team and oncology      I have spent a total of 30 minutes with patient reviewing chart , telemetry, EKGs, labs and examining patient as well as establishing an assessment and plan that was discussed with the patient.  > 50% of time was spent in direct patient care.     Signed, Marjo Bicker, MD  04/07/2023, 11:31 AM

## 2023-04-07 NOTE — TOC Initial Note (Signed)
Transition of Care [facility]) - Initial/Assessment Note    Patient Details  Name: Lisa [name] MRN: [id number] Date of Birth: 1934/05/18  [facility]) CM/SW Contact:    Elliot Gault, LCSW Phone Number: 04/07/2023, 11:10 AM  Clinical Narrative:                  Pt admitted from home. PT recommending HHPT at dc.   Met with pt and her daughter at bedside to assess and review dc planning. Pt and her dtr reside together Dtr assists with transportation. Pt is able to obtain medications. Pt has a cane, walker, shower chair for DME.   Reviewed PT recommendation and dtr states that she does not think pt needs the [facility] this time. Dtr states they have had HH PT every time after dc and she saw her mom work with PT here this AM and she feels that they can manage at home. Dtr aware that TOC will follow up prior to pt dc.   Expected Discharge Plan: Home/Self Care Barriers to Discharge: Continued Medical Work up   Patient Goals and CMS Choice Patient states their goals for this hospitalization and ongoing recovery are:: return home   Choice offered to / list presented to : Adult Children      Expected Discharge Plan and Services In-house Referral: Clinical Social Work     Living arrangements for the past 2 months: Single Family Home                                      Prior Living Arrangements/Services Living arrangements for the past 2 months: Single Family Home Lives with:: Adult Children Patient language and need for interpreter reviewed:: Yes Do you feel safe going back to the place where you live?: Yes      Need for Family Participation in Patient Care: Yes (Comment) Care giver support system in place?: Yes (comment) Current home services: DME Criminal Activity/Legal Involvement Pertinent to Current Situation/Hospitalization: No - Comment as needed  Activities of Daily Living   ADL Screening (condition at time of admission) Patient's cognitive ability  adequate to safely complete daily activities?: Yes Is the patient deaf or have difficulty hearing?: Yes Does the patient have difficulty seeing, even when wearing glasses/contacts?: No  Permission Sought/Granted                  Emotional Assessment Appearance:: Appears younger than stated age Attitude/Demeanor/Rapport: Engaged Affect (typically observed): Pleasant Orientation: : Oriented to Self, Oriented to Place, Oriented to  Time, Oriented to Situation Alcohol / Substance Use: Not Applicable Psych Involvement: No (comment)  Admission diagnosis:  NSTEMI (non-ST elevated myocardial infarction) (HCC) [I21.4] Pancytopenia (HCC) [D61.818] Patient Active Problem List   Diagnosis Date Noted   NSTEMI (non-ST elevated myocardial infarction) (HCC) 04/06/2023   Nonrheumatic aortic valve stenosis 11/20/2022   Mitral regurgitation 11/20/2022   Diarrhea 11/07/2022   Pancytopenia (HCC) 11/03/2022   Hyperkalemia 11/03/2022   Acute on chronic diastolic CHF (congestive heart failure) (HCC) 02/11/2022   TIA (transient ischemic attack) 02/10/2022   Myelodysplastic syndrome (HCC) 05/14/2021   Hyponatremia    Labile blood pressure    Debility 10/30/2020   Anemia of chronic disease    Paroxysmal atrial fibrillation (HCC)    Malnutrition of moderate degree 10/28/2020   Bilateral knee effusions    FTT (failure to thrive) in adult    Decreased appetite  Weakness    Gout 10/21/2020   Hypothyroidism 10/21/2020   HTN (hypertension) 10/21/2020   Weight loss 10/21/2020   Generalized muscle weakness 10/21/2020   Osteoarthritis 10/21/2020   Atrial fibrillation with RVR (HCC) 10/21/2020   Deficiency anemia 03/13/2019   Chronic respiratory failure with hypoxia (HCC) 05/31/2017   Hyperglycemia 05/31/2017   Chronic renal failure (CRF), stage 3b (HCC) 05/31/2017   Normocytic anemia 05/31/2017   COPD with acute exacerbation (HCC) 05/31/2017   Abdominal aortic aneurysm 05/31/2017   Urinary  incontinence 05/31/2017   Vitamin D deficiency 06/10/2016   Neutropenia (HCC) 06/09/2016   Invasive ductal carcinoma of left breast (HCC) 06/09/2016   Adenocarcinoma of left lung (HCC) 06/09/2016   Malignant neoplasm of unspecified site of unspecified female breast (HCC) 06/09/2016   Adenomatous polyps 02/08/2013   PCP:  [name], MD Pharmacy:   [facility] - [place], [place] - [address] [place] [address] Phone: [phone] Fax: [phone]  [facility] (NOT MAIL ORDER) - [place], [place] - [address] [facility] [address] [place] [address] Phone: [phone] Fax: [phone]  [facility] - [place], [place] - [address] [place] [address] Phone: [phone] Fax: [phone]     Social Determinants of Health (SDOH) Social History: SDOH Screenings   Food Insecurity: No Food Insecurity (01/15/2023)  Housing: Low Risk  (01/15/2023)  Transportation Needs: No Transportation Needs (01/15/2023)  Utilities: Not At Risk (01/15/2023)  Depression (PHQ2-9): Low Risk  (12/12/2020)  Physical Activity: Inactive (12/12/2020)  Tobacco Use: Medium Risk (04/06/2023)   SDOH Interventions:     Readmission Risk Interventions    08/28/2021    1:06 PM  Readmission Risk Prevention Plan  Transportation Screening Complete  HRI or Home Care Consult Complete  Social Work Consult for Recovery Care Planning/Counseling Complete  Palliative Care Screening Not Applicable  Medication Review Ocean[employer]) Complete

## 2023-04-07 NOTE — Care Management Obs Status (Signed)
MEDICARE OBSERVATION STATUS NOTIFICATION   Patient Details  Name: Aponi Groomes MRN: [id number] Date of Birth: 08-Aug-1934   Medicare Observation Status Notification Given:  Yes    Elliot Gault, LCSW 04/07/2023, 10:10 AM

## 2023-04-07 NOTE — Evaluation (Signed)
Physical Therapy Evaluation Patient Details Name: Lisa [name] MRN: [id number] DOB: 03-Aug-1934 Today's Date: 04/07/2023  History of Present Illness  Lisa [name] is a 89 year old female with a history of myelodysplastic syndrome, COPD, hypertension, chronic respiratory failure on 2 L, paroxysmal atrial fibrillation on aspirin, lung adenocarcinoma status post LUL lobectomy, breast cancer, and HFpEF presenting with 3-day history of generalized weakness, shortness of breath, and decreased oral intake.  The patient's daughter at the bedside also relates that she has been a little bit confused.  The patient states that she has had more more difficulty getting out of bed and getting back into bed secondary to generalized weakness.  She has had some subjective fevers and chills.  She states that she has had more dyspnea on exertion.  She has also been complaining of intermittent chest discomfort with exertion over the past month.  She denies any headache, neck pain, nausea, vomiting, diarrhea.  She has had some loose stools without any hematochezia.  She has had some dark stools.  She denies any hematemesis.  She denies any dysuria or hematuria.  Daughter states that the patient has also had decreased oral intake.  Notably, the patient recently saw Dr. [name] in the office on 04/01/2023.  There are plans for bone marrow biopsy to be done.  Because of the above symptoms, she presented for further evaluation and treatment.   Clinical Impression  Patient demonstrates fair/good return for sitting up at bedside with [facility] partially raised, able to ambulate in room with slow labored cadence without loss of balance, had difficulty transferring from low commode in bathroom due to weakness and tolerated sitting up in chair with her daughter present after therapy.  Patient will benefit from continued skilled physical therapy in hospital and recommended venue below to increase strength, balance, endurance for safe ADLs  and gait.        Recommendations for follow up therapy are one component of a multi-disciplinary discharge planning process, led by the attending physician.  Recommendations may be updated based on patient status, additional functional criteria and insurance authorization.  Follow Up Recommendations       Assistance Recommended at Discharge Set up Supervision/Assistance  Patient can return home with the following  A little help with walking and/or transfers;A little help with bathing/dressing/bathroom;Help with stairs or ramp for entrance;Assistance with cooking/housework    Equipment Recommendations None recommended by PT  Recommendations for Other Services       Functional Status Assessment Patient has had a recent decline in their functional status and demonstrates the ability to make significant improvements in function in a reasonable and predictable amount of time.     Precautions / Restrictions Precautions Precautions: Fall Restrictions Weight Bearing Restrictions: No      Mobility  Bed Mobility Overal bed mobility: Needs Assistance Bed Mobility: Supine to Sit     Supine to sit: Supervision, HOB elevated     General bed mobility comments: increased time with slightly labored movement    Transfers Overall transfer level: Needs assistance Equipment used: Rolling walker (2 wheels), 1 person hand held assist Transfers: Sit to/from Stand, Bed to chair/wheelchair/BSC Sit to Stand: Min assist   Step pivot transfers: Min assist       General transfer comment: had diffiuclty completing sit to stands from low commode in bathroom due to weakness, required hand held assist when completing transfer to chair without AD    Ambulation/Gait Ambulation/Gait assistance: Min guard, Min assist Gait Distance (Feet): 15 Feet Assistive device:  Rolling walker (2 wheels) Gait Pattern/deviations: Decreased step length - right, Decreased step length - left, Decreased stride  length Gait velocity: decreased     General Gait Details: slow labored cadence without loss of balance while on 2 LPM, limited mostly due to fatigue  Stairs            Wheelchair Mobility    Modified Rankin (Stroke Patients Only)       Balance Overall balance assessment: Needs assistance Sitting-balance support: Feet supported, No upper extremity supported Sitting balance-Leahy Scale: Good Sitting balance - Comments: seated at EOB   Standing balance support: Reliant on assistive device for balance, During functional activity, Bilateral upper extremity supported Standing balance-Leahy Scale: Fair Standing balance comment: fair/good using RW                             Pertinent Vitals/Pain Pain Assessment Pain Assessment: No/denies pain    Home Living Family/patient expects to be discharged to:: Private residence Living Arrangements: Children Available Help at Discharge: Family;Available 24 hours/day Type of Home: House Home Access: Stairs to enter   Entrance Stairs-Number of Steps: 4 Alternate Level Stairs-Number of Steps: 14 Home Layout: Two level Home Equipment: Agricultural consultant (2 wheels);Cane - quad;BSC/3in1;Grab bars - tub/shower;Wheelchair - manual;Other (comment) Additional Comments: Pt reports living with daughter . Pt on supplemental O2 at home.    Prior Function Prior Level of Function : Needs assist       Physical Assist : Mobility (physical);ADLs (physical) Mobility (physical): Transfers;Gait;Bed mobility;Stairs   Mobility Comments: household and short distanced community ambulator using quad-cane or RW ADLs Comments: Assist needed for bathing. Independnet goorming, feeding, dressing, toileting. Assted for IADL's by family.     Hand Dominance   Dominant Hand: Right    Extremity/Trunk Assessment   Upper Extremity Assessment Upper Extremity Assessment: Overall WFL for tasks assessed    Lower Extremity Assessment Lower Extremity  Assessment: Generalized weakness    Cervical / Trunk Assessment Cervical / Trunk Assessment: Normal  Communication   Communication: HOH  Cognition Arousal/Alertness: Awake/alert Behavior During Therapy: WFL for tasks assessed/performed Overall Cognitive Status: Within Functional Limits for tasks assessed                                          General Comments      Exercises     Assessment/Plan    PT Assessment Patient needs continued PT services  PT Problem List Decreased strength;Decreased activity tolerance;Decreased balance;Decreased mobility       PT Treatment Interventions DME instruction;Gait training;Stair training;Functional mobility training;Therapeutic activities;Therapeutic exercise;Patient/family education;Balance training    PT Goals (Current goals can be found in the Care Plan section)  Acute Rehab PT Goals Patient Stated Goal: return home with family to assist PT Goal Formulation: With patient/family Time For Goal Achievement: 04/14/23 Potential to Achieve Goals: Good    Frequency Min 3X/week     Co-evaluation               AM-PAC PT "6 Clicks" Mobility  Outcome Measure Help needed turning from your back to your side while in a flat bed without using bedrails?: None Help needed moving from lying on your back to sitting on the side of a flat bed without using bedrails?: A Little Help needed moving to and from a bed to a chair (including  a wheelchair)?: A Little Help needed standing up from a chair using your arms (e.g., wheelchair or bedside chair)?: A Little Help needed to walk in hospital room?: A Little Help needed climbing 3-5 steps with a railing? : A Lot 6 Click Score: 18    End of Session Equipment Utilized During Treatment: Oxygen Activity Tolerance: Patient tolerated treatment well;Patient limited by fatigue Patient left: in chair;with call bell/phone within reach;with family/visitor present Nurse Communication:  Mobility status PT Visit Diagnosis: Unsteadiness on feet (R26.81);Other abnormalities of gait and mobility (R26.89);Muscle weakness (generalized) (M62.81)    Time: 1610-9604 PT Time Calculation (min) (ACUTE ONLY): 27 min   Charges:   PT Evaluation $PT Eval Moderate Complexity: 1 Mod PT Treatments $Therapeutic Activity: 23-37 mins        1:39 PM, 04/07/23 Ocie Bob, MPT Physical Therapist with [facility] 336 [phone] office [phone] mobile phone

## 2023-04-07 NOTE — Progress Notes (Signed)
Subjective: Patient feeling okay this morning. She is up out of bed with PT. She notes that her abdomen feels okay. No nausea or vomiting. She notes she had some diarrhea this morning but stools were not black, brown in color. Denies BRBPR.   Objective: Vital signs in last 24 hours: Temp:  [97.8 F (36.6 C)-99 F (37.2 C)] 98.4 F (36.9 C) (06/05 0359) Pulse Rate:  [72-96] 92 (06/05 0359) Resp:  [16-36] 19 (06/05 0359) BP: (116-174)/(65-100) 150/81 (06/05 0359) SpO2:  [86 %-100 %] 99 % (06/05 0705) Weight:  [67.1 kg-69 kg] 67.1 kg (06/04 1500) Last BM Date : 04/05/23 General:   Alert and oriented, pleasant, on supplemental O2 via n.c. Head:  Normocephalic and atraumatic. Eyes:  No icterus, sclera clear. Conjuctiva pink.  Mouth:  Without lesions, mucosa pink and moist.  Heart:  S1, S2 present, no murmurs noted.  Lungs: decreased breath sounds bilaterally  Abdomen:  Bowel sounds present, soft, non-tender, non-distended. No HSM or hernias noted. No rebound or guarding. No masses appreciated  Msk:  Symmetrical without gross deformities. Normal posture. Pulses:  Normal pulses noted. Extremities:  Without clubbing or edema. Neurologic:  Alert and  oriented x4;  grossly normal neurologically. Skin:  Warm and dry, intact without significant lesions.  Psych:  Alert and cooperative. Normal mood and affect.  Intake/Output from previous day: 06/04 0701 - 06/05 0700 In: 1227.3 [P.O.:240; I.V.:987.3] Out: 1100 [Urine:1100] Intake/Output this shift: No intake/output data recorded.  Lab Results: Recent Labs    04/06/23 1205 04/07/23 0430  WBC 2.9* 1.5*  HGB 7.0* 7.7*  HCT 23.9* 25.0*  PLT 66* 64*   BMET Recent Labs    04/06/23 1205  NA 128*  K 5.4*  CL 93*  CO2 28  GLUCOSE 102*  BUN 22  CREATININE 0.93  CALCIUM 8.7*   LFT Recent Labs    04/06/23 1205  PROT 8.8*  ALBUMIN 3.2*  AST 32  ALT 15  ALKPHOS 36*  BILITOT 0.8   Studies/Results: DG Chest Port 1  View  Result Date: 04/06/2023 CLINICAL DATA:  Shortness of breath EXAM: PORTABLE CHEST 1 VIEW COMPARISON:  11/03/2022 FINDINGS: Mild cardiomegaly. Aortic atherosclerosis. There is hyperinflation of the lungs compatible with COPD. Mild interstitial prominence is similar to prior study, likely chronic lung disease. Bibasilar linear densities compatible with atelectasis or scarring. No effusions. IMPRESSION: COPD/chronic changes. Cardiomegaly. Bibasilar atelectasis or scarring. Electronically Signed   By: Charlett Nose M.D.   On: 04/06/2023 12:40    Assessment: Lisa [name] is an 89 year old female with history of stage I adenocarcinoma of the left upper lung, stage I left breast cancer, possible CMML versus other MDS in the setting of pancytopenia, paroxysmal atrial fibrillation on aspirin, HTN, COPD on home to oxygen 2 L, HFpEF, mitral regurgitation and moderate to severe aortic valve stenosis, TIA, who presented to the  ED yesterday with 3-day history of generalized weakness, shortness of breath, decreased PO intake, and some reported confusion by daughter, now admitted with COPD exacerbation, elevated troponin, anemia, acute metabolic encephalopathy. GI consulted for anemia.  Acute on chronic anemia with possible melena:  chronic anemia in setting of possible CMML/other MDS, following with [facility] hematology, receiving Aranesp with planned bone marrow biopsy for further evaluation. Hgb range 9-10 at baseline. Hgb 7 yesterday, up to 7.7 s/p 1 unit PRBCs. Iron studies on 5/30 were WNL. Reports of intermittent black stools though taking pepto bismol around the times this is noted, per patient. Daughter notes  that patient had a BM yesterday that did not appear black and BM this morning was brown in color per patient. No BRBPR. Denies NSAID use. No ACs.  Black stools likely secondary to pepto bismol use, Hemoccult was ordered yesterday but never collected. Will see if staff can obtain this today. Suspect primary  cause of her anemia is underlying possible CMML/other MDS. Will hold off on EGD for now given elevated Troponin/NSTEMI and COPD exacerbation, as she is not an ideal candidate to undergo endoscopic evaluations at this time.  I did reach out to patient's Grandson Dr. Georgiana Shore,  per patient and daughter's request to discuss patient's condition/plan of care. He verbalized understanding and requested up update by the attending physician regarding patient's overall status as well, I will make Dr. Laural Benes aware of this request.   Diarrhea: chronic, intermittent. Improvement with pepto bismol. Possibly related to IBS   Plan: -Trend h&h -Collect hemoccult -Transfuse for hgb <7 -Monitor for overt GI bleeding -PPI BID -Hold off on EGD for now, if black stools persists/hgb continues to decline may need to consider endoscopic evaluation after cardiac workup/clearance, however, patient is not an ideal candidate at this time given multimorbidities    LOS: 0 days    04/07/2023, 9:00 AM  Taleeya Blondin L. Jeanmarie Hubert, MSN, APRN, AGNP-C Adult-Gerontology Nurse Practitioner [facility] at [facility]

## 2023-04-07 NOTE — Progress Notes (Signed)
  Echocardiogram 2D Echocardiogram has been performed.  Lisa [name] 04/07/2023, 3:44 PM

## 2023-04-07 NOTE — Progress Notes (Signed)
PROGRESS NOTE   Lisa [name]  WUJ:[id number] DOB: Nov 21, 1933 DOA: 04/06/2023 PCP: [name], MD   Chief Complaint  Patient presents with   Fatigue   Level of care: Telemetry  Brief Admission History:  89 year old female with a history of myelodysplastic syndrome, COPD, hypertension, chronic respiratory failure on 2 L, paroxysmal atrial fibrillation on aspirin, lung adenocarcinoma status post LUL lobectomy, breast cancer, and HFpEF presenting with 3-day history of generalized weakness, shortness of breath, and decreased oral intake.  The patient's daughter at the bedside also relates that she has been a little bit confused.  The patient states that she has had more more difficulty getting out of bed and getting back into bed secondary to generalized weakness.  She has had some subjective fevers and chills.  She states that she has had more dyspnea on exertion.  She has also been complaining of intermittent chest discomfort with exertion over the past month.  She denies any headache, neck pain, nausea, vomiting, diarrhea.  She has had some loose stools without any hematochezia.  She has had some dark stools.  She denies any hematemesis.  She denies any dysuria or hematuria.  Daughter states that the patient has also had decreased oral intake.  Notably, the patient recently saw Dr. [name] in the office on 04/01/2023.  There are plans for bone marrow biopsy to be done.  Because of the above symptoms, she presented for further evaluation and treatment. In the ED, the patient was afebrile hemodynamically stable with oxygen saturation 95% on 2 L.  WBC 2.9, hemoglobin 10.0, platelets 66,000.  Sodium 128, potassium 5.4, bicarbonate 20, serum creatinine 0.93.  Chest x-ray showed chronic interstitial markings with hyperinflation.  Troponin 319.  EKG showed sinus rhythm with nonspecific STT changes.   Assessment and Plan:  NSTEMI  - being medically managed by cardiology - started imdur 30 mg daily per  cardiology - no chest pain symptoms at this time - per cardiology not a candidate for invasive or aggressive procedures/treatments  Mild COPD exacerbation  - continue bronchodilators  - wean solumedrol as breathing continues to improve   ABLA with melena (guaiac positive) - appreciate GI team consultation - s/p 1 unit PRBC transfusion on 6/4 - Hg up to 7.7  - continue PPI twice daily per GI - EGD on hold for now pending cardiac clearance   Chronic HFpEF -IV furosemide 20 mg daily started 04/07/23 by cardiology service  -11/04/22 Echo EF 65-70%, no WMA, G2DD   Chronic respiratory failure with hypoxia -Chronically on 2 L at home   Thrombocytopenia/leukopenia -This has been chronic likely secondary to the patient's myelodysplastic syndrome -There are plans for bone marrow biopsy per Dr. [name]   Paroxysmal Atrial fib -currently in sinus -Continue metoprolol succinate -not a candidate for anticoagulation due to MDS and chronic anemia   Hyperkalemia -Lokelma ordered -Treated with temporizing measures   Hyponatremia -Secondary to volume depletion and poor solute intake -Judicious IV fluids   DVT prophylaxis: SCD Code Status: Full  Family Communication: daughter at bedside update 6/5  Disposition: Status is: Inpatient Remains inpatient appropriate because: work up in progress    Consultants:  GI Cardiology   Procedures:   Antimicrobials:    Subjective: Pt denies CP, having black stools.  No SOB. Pt willing to work with PT today.   Objective: Vitals:   04/07/23 0359 04/07/23 0705 04/07/23 1310 04/07/23 1507  BP: (!) 150/81   (!) 173/83  Pulse: 92   84  Resp:  19   (!) 22  Temp: 98.4 F (36.9 C)   97.7 F (36.5 C)  TempSrc: Oral   Oral  SpO2: 99% 99% 99% 100%  Weight:      Height:        Intake/Output Summary (Last 24 hours) at 04/07/2023 1816 Last data filed at 04/07/2023 1700 Gross per 24 hour  Intake 2329.27 ml  Output 1100 ml  Net 1229.27 ml    Filed Weights   04/06/23 1142 04/06/23 1500  Weight: 69 kg 67.1 kg   Examination:  General exam: frail, elderly female, awake, alert, cooperative. Appears calm and comfortable  Respiratory system: Clear to auscultation. Respiratory effort normal. Cardiovascular system: normal S1 & S2 heard. No JVD, murmurs, rubs, gallops or clicks. No pedal edema. Gastrointestinal system: Abdomen is nondistended, soft and nontender. No organomegaly or masses felt. Normal bowel sounds heard. Central nervous system: Alert and oriented. Extremities: Symmetric 5 x 5 power. Skin: No rashes, lesions or ulcers. Psychiatry: Judgement and insight appear normal. Mood & affect appropriate.   Data Reviewed: I have personally reviewed following labs and imaging studies  CBC: Recent Labs  Lab 04/01/23 0901 04/06/23 1205 04/07/23 0430  WBC 2.5* 2.9* 1.5*  NEUTROABS 0.3* 0.3*  --   HGB 8.3* 7.0* 7.7*  HCT 28.4* 23.9* 25.0*  MCV 100.7* 98.8 94.7  PLT 44* 66* 64*    Basic Metabolic Panel: Recent Labs  Lab 04/01/23 0901 04/06/23 1205  NA 132* 128*  K 4.5 5.4*  CL 99 93*  CO2 26 28  GLUCOSE 97 102*  BUN 20 22  CREATININE 0.89 0.93  CALCIUM 8.8* 8.7*    CBG: No results for input(s): "GLUCAP" in the last 168 hours.  Recent Results (from the past 240 hour(s))  Culture, blood (Routine X 2) w Reflex to ID Panel     Status: None (Preliminary result)   Collection Time: 04/06/23  3:33 PM   Specimen: BLOOD LEFT ARM  Result Value Ref Range Status   Specimen Description BLOOD LEFT ARM  Final   Special Requests   Final    BOTTLES DRAWN AEROBIC AND ANAEROBIC Blood Culture adequate volume   Culture   Final    NO GROWTH < 24 HOURS Performed at [facility], [address], [place], [place] [address]    Report Status PENDING  Incomplete  Culture, blood (Routine X 2) w Reflex to ID Panel     Status: None (Preliminary result)   Collection Time: 04/06/23  3:44 PM   Specimen: BLOOD RIGHT ARM  Result Value  Ref Range Status   Specimen Description BLOOD RIGHT ARM  Final   Special Requests   Final    BOTTLES DRAWN AEROBIC AND ANAEROBIC Blood Culture adequate volume   Culture   Final    NO GROWTH < 24 HOURS Performed at [facility], [address], [place], [place] [address]    Report Status PENDING  Incomplete  Respiratory (~20 pathogens) panel by PCR     Status: None   Collection Time: 04/06/23 10:50 PM   Specimen: Nasopharyngeal Swab; Respiratory  Result Value Ref Range Status   Adenovirus NOT DETECTED NOT DETECTED Final   Coronavirus 229E NOT DETECTED NOT DETECTED Final    Comment: (NOTE) The Coronavirus on the Respiratory Panel, DOES NOT test for the novel  Coronavirus (2019 nCoV)    Coronavirus HKU1 NOT DETECTED NOT DETECTED Final   Coronavirus NL63 NOT DETECTED NOT DETECTED Final   Coronavirus OC43 NOT DETECTED NOT  DETECTED Final   Metapneumovirus NOT DETECTED NOT DETECTED Final   Rhinovirus / Enterovirus NOT DETECTED NOT DETECTED Final   Influenza A NOT DETECTED NOT DETECTED Final   Influenza B NOT DETECTED NOT DETECTED Final   Parainfluenza Virus 1 NOT DETECTED NOT DETECTED Final   Parainfluenza Virus 2 NOT DETECTED NOT DETECTED Final   Parainfluenza Virus 3 NOT DETECTED NOT DETECTED Final   Parainfluenza Virus 4 NOT DETECTED NOT DETECTED Final   Respiratory Syncytial Virus NOT DETECTED NOT DETECTED Final   Bordetella pertussis NOT DETECTED NOT DETECTED Final   Bordetella Parapertussis NOT DETECTED NOT DETECTED Final   Chlamydophila pneumoniae NOT DETECTED NOT DETECTED Final   Mycoplasma pneumoniae NOT DETECTED NOT DETECTED Final    Comment: Performed at [facility], [address] [place], [place] [address]     Radiology Studies: ECHOCARDIOGRAM COMPLETE  Result Date: 04/07/2023    ECHOCARDIOGRAM REPORT   Patient Name:   Jazia Mimbs Date of Exam: 04/07/2023 Medical Rec #:  [id number]      Height:       64.0 in Accession #:    [id number]     Weight:       148.0 lb  Date of Birth:  07/23/1934      BSA:          1.721 m Patient Age:    89 years       BP:           173/83 mmHg Patient Gender: F              HR:           71 bpm. [facility] Procedure: 2D Echo, Cardiac Doppler and Color Doppler Indications:    Elevated Troponin  History:        Patient has prior history of Echocardiogram examinations, most                 recent 11/04/2022. NSTEMI, COPD, Aortic Valve Disease,                 Arrythmias:Atrial Fibrillation; Risk Factors:Former Smoker.  Sonographer:    Aron Baba Referring Phys: [phone] DAVID TAT IMPRESSIONS  1. Left ventricular ejection fraction, by estimation, is 60 to 65%. The left ventricle has normal function. The left ventricle has no regional wall motion abnormalities. There is mild left ventricular hypertrophy. Left ventricular diastolic parameters are consistent with Grade II diastolic dysfunction (pseudonormalization).  2. Right ventricular systolic function is mildly reduced. The right ventricular size is mildly enlarged. There is severely elevated pulmonary artery systolic pressure. The estimated right ventricular systolic pressure is 90.3 mmHg.  3. Left atrial size was severely dilated.  4. Right atrial size was severely dilated.  5. The mitral valve is normal in structure. Mild mitral valve regurgitation. No evidence of mitral stenosis.  6. The aortic valve is calcified. There is severe calcifcation of the aortic valve. Aortic valve regurgitation is not visualized. Moderate to severe aortic valve stenosis. Aortic valve mean gradient measures 31.0 mmHg. Aortic valve Vmax measures 3.54 m/s. DVI is 0.26.  7. The inferior vena cava is dilated in size with <50% respiratory variability, suggesting right atrial pressure of 15 mmHg. Comparison(s): Changes from prior study are noted. Moderate to severe aortic valve stenosis, Mean AV gradient increased from 22 mm Hg in 11/2022 to 31 mm Hg now. FINDINGS  Left Ventricle: Left ventricular ejection  fraction, by estimation, is 60 to 65%. The left  ventricle has normal function. The left ventricle has no regional wall motion abnormalities. The left ventricular internal cavity size was normal in size. There is  mild left ventricular hypertrophy. Left ventricular diastolic parameters are consistent with Grade II diastolic dysfunction (pseudonormalization). Right Ventricle: The right ventricular size is mildly enlarged. No increase in right ventricular wall thickness. Right ventricular systolic function is mildly reduced. There is severely elevated pulmonary artery systolic pressure. The tricuspid regurgitant velocity is 4.34 m/s, and with an assumed right atrial pressure of 15 mmHg, the estimated right ventricular systolic pressure is 90.3 mmHg. Left Atrium: Left atrial size was severely dilated. Right Atrium: Right atrial size was severely dilated. Pericardium: There is no evidence of pericardial effusion. Mitral Valve: The mitral valve is normal in structure. Mild mitral valve regurgitation. No evidence of mitral valve stenosis. MV peak gradient, 9.0 mmHg. The mean mitral valve gradient is 3.0 mmHg. Tricuspid Valve: The tricuspid valve is normal in structure. Tricuspid valve regurgitation is mild . No evidence of tricuspid stenosis. Aortic Valve: The aortic valve is calcified. There is severe calcifcation of the aortic valve. Aortic valve regurgitation is not visualized. Moderate to severe aortic stenosis is present. Aortic valve mean gradient measures 31.0 mmHg. Aortic valve peak gradient measures 50.1 mmHg. Aortic valve area, by VTI measures 0.61 cm. Pulmonic Valve: The pulmonic valve was grossly normal. Pulmonic valve regurgitation is mild. No evidence of pulmonic stenosis. Aorta: The aortic root and ascending aorta are structurally normal, with no evidence of dilitation. Venous: The inferior vena cava is dilated in size with less than 50% respiratory variability, suggesting right atrial pressure of 15 mmHg.  IAS/Shunts: There is right bowing of the interatrial septum, suggestive of elevated left atrial pressure. No atrial level shunt detected by color flow Doppler.  LEFT VENTRICLE PLAX 2D LVIDd:         4.10 cm   Diastology LVIDs:         2.80 cm   LV e' medial:    6.81 cm/s LV PW:         1.20 cm   LV E/e' medial:  21.3 LV IVS:        1.20 cm   LV e' lateral:   10.90 cm/s LVOT diam:     1.70 cm   LV E/e' lateral: 13.3 LV SV:         47 LV SV Index:   27 LVOT Area:     2.27 cm  LEFT ATRIUM              Index        RIGHT ATRIUM           Index LA diam:        4.30 cm  2.50 cm/m   RA Area:     22.90 cm LA Vol (A2C):   104.0 ml 60.42 ml/m  RA Volume:   62.30 ml  36.19 ml/m LA Vol (A4C):   101.0 ml 58.67 ml/m LA Biplane Vol: 109.0 ml 63.32 ml/m  AORTIC VALVE                     PULMONIC VALVE AV Area (Vmax):    0.59 cm      PR End Diast Vel: 6.81 msec AV Area (Vmean):   0.57 cm AV Area (VTI):     0.61 cm AV Vmax:           354.00 cm/s AV Vmean:  238.667 cm/s AV VTI:            0.767 m AV Peak Grad:      50.1 mmHg AV Mean Grad:      31.0 mmHg LVOT Vmax:         91.30 cm/s LVOT Vmean:        60.400 cm/s LVOT VTI:          0.205 m LVOT/AV VTI ratio: 0.27  AORTA Ao Root diam: 3.10 cm Ao Asc diam:  2.90 cm MITRAL VALVE                TRICUSPID VALVE MV Area (PHT): 5.13 cm     TR Peak grad:   75.3 mmHg MV Area VTI:   1.27 cm     TR Vmax:        434.00 cm/s MV Peak grad:  9.0 mmHg MV Mean grad:  3.0 mmHg     SHUNTS MV Vmax:       1.50 m/s     Systemic VTI:  0.20 m MV Vmean:      76.9 cm/s    Systemic Diam: 1.70 cm MV Decel Time: 148 msec MR Peak grad: 150.1 mmHg MR Mean grad: 97.0 mmHg MR Vmax:      612.50 cm/s MR Vmean:     464.0 cm/s MV E velocity: 145.00 cm/s MV A velocity: 80.80 cm/s MV E/A ratio:  1.79 Vishnu Priya Mallipeddi Electronically signed by Winfield Rast Mallipeddi Signature Date/Time: 04/07/2023/5:18:59 PM    Final    DG Chest Port 1 View  Result Date: 04/06/2023 CLINICAL DATA:  Shortness of  breath EXAM: PORTABLE CHEST 1 VIEW COMPARISON:  11/03/2022 FINDINGS: Mild cardiomegaly. Aortic atherosclerosis. There is hyperinflation of the lungs compatible with COPD. Mild interstitial prominence is similar to prior study, likely chronic lung disease. Bibasilar linear densities compatible with atelectasis or scarring. No effusions. IMPRESSION: COPD/chronic changes. Cardiomegaly. Bibasilar atelectasis or scarring. Electronically Signed   By: Charlett Nose M.D.   On: 04/06/2023 12:40    Scheduled Meds:  arformoterol  15 mcg Nebulization BID   budesonide (PULMICORT) nebulizer solution  0.5 mg Nebulization BID   furosemide  20 mg Intravenous BID   ipratropium-albuterol  3 mL Nebulization Q6H   isosorbide mononitrate  30 mg Oral Daily   levothyroxine  100 mcg Oral QAC breakfast   magnesium oxide  400 mg Oral BID   methylPREDNISolone (SOLU-MEDROL) injection  60 mg Intravenous Q12H   metoprolol succinate  25 mg Oral Daily   mirtazapine  7.5 mg Oral QHS   multivitamin with minerals  1 tablet Oral Daily   pantoprazole (PROTONIX) IV  40 mg Intravenous Q12H   sodium zirconium cyclosilicate  10 g Oral Daily   Continuous Infusions:   LOS: 0 days   Time spent: 41 mins  Perl Kerney Laural Benes, MD How to contact the [facility] Attending or Consulting provider 7A - 7P or covering provider during after hours 7P -7A, for this patient?  Check the care team in [facility] and look for a) attending/consulting [facility] provider listed and b) the [facility] team listed Log into [email or web address] and use [place]'s universal password to access. If you do not have the password, please contact the hospital operator. Locate the [facility] provider you are looking for under [facility] and page to a number that you can be directly reached. If you still have difficulty reaching the provider, please page the [facility] (Director on Call) for the Hospitalists listed on amion  for assistance.  04/07/2023, 6:16 PM

## 2023-04-08 DIAGNOSIS — I5033 Acute on chronic diastolic (congestive) heart failure: Secondary | ICD-10-CM | POA: Diagnosis not present

## 2023-04-08 DIAGNOSIS — I214 Non-ST elevation (NSTEMI) myocardial infarction: Secondary | ICD-10-CM | POA: Diagnosis not present

## 2023-04-08 DIAGNOSIS — D61818 Other pancytopenia: Secondary | ICD-10-CM | POA: Diagnosis not present

## 2023-04-08 DIAGNOSIS — D469 Myelodysplastic syndrome, unspecified: Secondary | ICD-10-CM | POA: Diagnosis not present

## 2023-04-08 DIAGNOSIS — R197 Diarrhea, unspecified: Secondary | ICD-10-CM | POA: Diagnosis not present

## 2023-04-08 DIAGNOSIS — I350 Nonrheumatic aortic (valve) stenosis: Secondary | ICD-10-CM | POA: Diagnosis not present

## 2023-04-08 DIAGNOSIS — I480 Paroxysmal atrial fibrillation: Secondary | ICD-10-CM | POA: Diagnosis not present

## 2023-04-08 DIAGNOSIS — D649 Anemia, unspecified: Secondary | ICD-10-CM | POA: Diagnosis not present

## 2023-04-08 LAB — CBC
HCT: 25.4 % — ABNORMAL LOW (ref 36.0–46.0)
Hemoglobin: 7.8 g/dL — ABNORMAL LOW (ref 12.0–15.0)
MCH: 29.3 pg (ref 26.0–34.0)
MCHC: 30.7 g/dL (ref 30.0–36.0)
MCV: 95.5 fL (ref 80.0–100.0)
Platelets: 77 10*3/uL — ABNORMAL LOW (ref 150–400)
RBC: 2.66 MIL/uL — ABNORMAL LOW (ref 3.87–5.11)
RDW: 18 % — ABNORMAL HIGH (ref 11.5–15.5)
WBC: 1.9 10*3/uL — ABNORMAL LOW (ref 4.0–10.5)
nRBC: 10.5 % — ABNORMAL HIGH (ref 0.0–0.2)

## 2023-04-08 LAB — BPAM RBC
Blood Product Expiration Date: 202407142359
ISSUE DATE / TIME: 202406041534
ISSUE DATE / TIME: 202406061050

## 2023-04-08 LAB — TYPE AND SCREEN
Unit division: 0
Unit division: 0

## 2023-04-08 LAB — MAGNESIUM: Magnesium: 1.8 mg/dL (ref 1.7–2.4)

## 2023-04-08 LAB — BASIC METABOLIC PANEL
Anion gap: 10 (ref 5–15)
BUN: 25 mg/dL — ABNORMAL HIGH (ref 8–23)
CO2: 28 mmol/L (ref 22–32)
Calcium: 9 mg/dL (ref 8.9–10.3)
Chloride: 94 mmol/L — ABNORMAL LOW (ref 98–111)
Creatinine, Ser: 1.15 mg/dL — ABNORMAL HIGH (ref 0.44–1.00)
GFR, Estimated: 46 mL/min — ABNORMAL LOW (ref >60)
Glucose, Bld: 136 mg/dL — ABNORMAL HIGH (ref 70–99)
Potassium: 4.8 mmol/L (ref 3.5–5.1)
Sodium: 132 mmol/L — ABNORMAL LOW (ref 135–145)

## 2023-04-08 LAB — PREPARE RBC (CROSSMATCH)

## 2023-04-08 LAB — CULTURE, BLOOD (ROUTINE X 2): Special Requests: ADEQUATE

## 2023-04-08 LAB — HEMOGLOBIN AND HEMATOCRIT, BLOOD
HCT: 29 % — ABNORMAL LOW (ref 36.0–46.0)
Hemoglobin: 8.9 g/dL — ABNORMAL LOW (ref 12.0–15.0)

## 2023-04-08 MED ORDER — SODIUM CHLORIDE 0.9% IV SOLUTION: Freq: Once | INTRAVENOUS | Status: AC

## 2023-04-08 MED ORDER — MAGNESIUM SULFATE 2 GM/50ML IV SOLN
2.0000 g | Freq: Once | INTRAVENOUS | Status: AC
  Administered 2023-04-08: 2 g via INTRAVENOUS
  Filled 2023-04-08: qty 50

## 2023-04-08 MED ORDER — HYDRALAZINE HCL 20 MG/ML IJ SOLN
10.0000 mg | Freq: Four times a day (QID) | INTRAMUSCULAR | Status: DC | PRN
  Administered 2023-04-08 – 2023-04-09 (×2): 10 mg via INTRAVENOUS
  Filled 2023-04-08 (×2): qty 1

## 2023-04-08 MED ORDER — IPRATROPIUM-ALBUTEROL 0.5-2.5 (3) MG/3ML IN SOLN
3.0000 mL | Freq: Three times a day (TID) | RESPIRATORY_TRACT | Status: DC
  Administered 2023-04-08 (×2): 3 mL via RESPIRATORY_TRACT
  Filled 2023-04-08 (×2): qty 3

## 2023-04-08 MED ORDER — IPRATROPIUM-ALBUTEROL 0.5-2.5 (3) MG/3ML IN SOLN: 3.0000 mL | RESPIRATORY_TRACT | Status: DC | PRN

## 2023-04-08 MED ORDER — IPRATROPIUM-ALBUTEROL 0.5-2.5 (3) MG/3ML IN SOLN
3.0000 mL | Freq: Three times a day (TID) | RESPIRATORY_TRACT | Status: DC
  Administered 2023-04-09: 3 mL via RESPIRATORY_TRACT
  Filled 2023-04-08: qty 3

## 2023-04-08 MED ORDER — METHYLPREDNISOLONE SODIUM SUCC 40 MG IJ SOLR
40.0000 mg | Freq: Every day | INTRAMUSCULAR | Status: DC
  Administered 2023-04-09: 40 mg via INTRAVENOUS
  Filled 2023-04-08: qty 1

## 2023-04-08 NOTE — Progress Notes (Signed)
PT Cancellation Note  Patient Details Name: Lisa [name] MRN: [id number] DOB: [name] 09, 1934   Cancelled Treatment:    Reason Eval/Treat Not Completed: Patient declined, no reason specified.  Patient refused in AM and PM secondary to c/o fatigue.    2:42 PM, 04/08/23 Ocie Bob, MPT Physical Therapist with [facility] [phone] office [phone] mobile phone

## 2023-04-08 NOTE — Progress Notes (Signed)
Progress Note  Patient Name: Lisa [name] Date of Encounter: 04/08/2023  Primary Cardiologist: Marjo Bicker, MD  Subjective   No acute events overnight.  No symptoms and SOB improving after IV Lasix.  Inpatient Medications    Scheduled Meds:  arformoterol  15 mcg Nebulization BID   budesonide (PULMICORT) nebulizer solution  0.5 mg Nebulization BID   furosemide  20 mg Intravenous BID   ipratropium-albuterol  3 mL Nebulization Q6H   isosorbide mononitrate  30 mg Oral Daily   levothyroxine  100 mcg Oral QAC breakfast   magnesium oxide  400 mg Oral BID   methylPREDNISolone (SOLU-MEDROL) injection  40 mg Intravenous Q12H   metoprolol succinate  25 mg Oral Daily   mirtazapine  7.5 mg Oral QHS   multivitamin with minerals  1 tablet Oral Daily   pantoprazole (PROTONIX) IV  40 mg Intravenous Q12H   sodium zirconium cyclosilicate  10 g Oral Daily   Continuous Infusions:   PRN Meds: acetaminophen **OR** acetaminophen, gabapentin, hydrALAZINE, ipratropium-albuterol, ondansetron **OR** ondansetron (ZOFRAN) IV   Vital Signs    Vitals:   04/08/23 0820 04/08/23 0842 04/08/23 1055 04/08/23 1111  BP:  (!) 148/99 (!) 147/78 (!) 157/78  Pulse:  96 84 85  Resp:   17 18  Temp:   98 F (36.7 C) 98 F (36.7 C)  TempSrc:   Oral   SpO2: 97% 95% 99%   Weight:      Height:        Intake/Output Summary (Last 24 hours) at 04/08/2023 1208 Last data filed at 04/08/2023 1136 Gross per 24 hour  Intake 1101.94 ml  Output 3350 ml  Net -2248.06 ml   Filed Weights   04/06/23 1142 04/06/23 1500  Weight: 69 kg 67.1 kg    Telemetry     Personally reviewed.  ECG    Not performed today  Physical Exam   GEN: No acute distress.   Neck: JVD not examined as patient is sitting upright Cardiac: RRR, no murmur, rub, or gallop.  Respiratory: Nonlabored. Clear to auscultation bilaterally. GI: Soft, nontender, bowel sounds present. MS: No edema; No deformity. Neuro:   Nonfocal. Psych: Alert and oriented x 3. Normal affect.  Labs    Chemistry Recent Labs  Lab 04/06/23 1205 04/08/23 0415  NA 128* 132*  K 5.4* 4.8  CL 93* 94*  CO2 28 28  GLUCOSE 102* 136*  BUN 22 25*  CREATININE 0.93 1.15*  CALCIUM 8.7* 9.0  PROT 8.8*  --   ALBUMIN 3.2*  --   AST 32  --   ALT 15  --   ALKPHOS 36*  --   BILITOT 0.8  --   GFRNONAA 59* 46*  ANIONGAP 7 10     Hematology Recent Labs  Lab 04/06/23 1205 04/07/23 0430 04/08/23 0415  WBC 2.9* 1.5* 1.9*  RBC 2.42* 2.64* 2.66*  HGB 7.0* 7.7* 7.8*  HCT 23.9* 25.0* 25.4*  MCV 98.8 94.7 95.5  MCH 28.9 29.2 29.3  MCHC 29.3* 30.8 30.7  RDW 17.7* 18.4* 18.0*  PLT 66* 64* 77*    Cardiac Enzymes Recent Labs  Lab 04/06/23 1205 04/06/23 1351  TROPONINIHS 319* 325*    BNPNo results for input(s): "BNP", "PROBNP" in the last 168 hours.   DDimerNo results for input(s): "DDIMER" in the last 168 hours.   Radiology    ECHOCARDIOGRAM COMPLETE  Result Date: 04/07/2023    ECHOCARDIOGRAM REPORT   Patient Name:   Lisa [name] Date of  Exam: 04/07/2023 Medical Rec #:  [id number]      Height:       64.0 in Accession #:    [id number]     Weight:       148.0 lb Date of Birth:  1934/10/02      BSA:          1.721 m Patient Age:    89 years       BP:           173/83 mmHg Patient Gender: F              HR:           71 bpm. [facility] Procedure: 2D Echo, Cardiac Doppler and Color Doppler Indications:    Elevated Troponin  History:        Patient has prior history of Echocardiogram examinations, most                 recent 11/04/2022. NSTEMI, COPD, Aortic Valve Disease,                 Arrythmias:Atrial Fibrillation; Risk Factors:Former Smoker.  Sonographer:    Aron Baba Referring Phys: [phone] DAVID TAT IMPRESSIONS  1. Left ventricular ejection fraction, by estimation, is 60 to 65%. The left ventricle has normal function. The left ventricle has no regional wall motion abnormalities. There is mild left ventricular  hypertrophy. Left ventricular diastolic parameters are consistent with Grade II diastolic dysfunction (pseudonormalization).  2. Right ventricular systolic function is mildly reduced. The right ventricular size is mildly enlarged. There is severely elevated pulmonary artery systolic pressure. The estimated right ventricular systolic pressure is 90.3 mmHg.  3. Left atrial size was severely dilated.  4. Right atrial size was severely dilated.  5. The mitral valve is normal in structure. Mild mitral valve regurgitation. No evidence of mitral stenosis.  6. The aortic valve is calcified. There is severe calcifcation of the aortic valve. Aortic valve regurgitation is not visualized. Moderate to severe aortic valve stenosis. Aortic valve mean gradient measures 31.0 mmHg. Aortic valve Vmax measures 3.54 m/s. DVI is 0.26.  7. The inferior vena cava is dilated in size with <50% respiratory variability, suggesting right atrial pressure of 15 mmHg. Comparison(s): Changes from prior study are noted. Moderate to severe aortic valve stenosis, Mean AV gradient increased from 22 mm Hg in 11/2022 to 31 mm Hg now. FINDINGS  Left Ventricle: Left ventricular ejection fraction, by estimation, is 60 to 65%. The left ventricle has normal function. The left ventricle has no regional wall motion abnormalities. The left ventricular internal cavity size was normal in size. There is  mild left ventricular hypertrophy. Left ventricular diastolic parameters are consistent with Grade II diastolic dysfunction (pseudonormalization). Right Ventricle: The right ventricular size is mildly enlarged. No increase in right ventricular wall thickness. Right ventricular systolic function is mildly reduced. There is severely elevated pulmonary artery systolic pressure. The tricuspid regurgitant velocity is 4.34 m/s, and with an assumed right atrial pressure of 15 mmHg, the estimated right ventricular systolic pressure is 90.3 mmHg. Left Atrium: Left atrial  size was severely dilated. Right Atrium: Right atrial size was severely dilated. Pericardium: There is no evidence of pericardial effusion. Mitral Valve: The mitral valve is normal in structure. Mild mitral valve regurgitation. No evidence of mitral valve stenosis. MV peak gradient, 9.0 mmHg. The mean mitral valve gradient is 3.0 mmHg. Tricuspid Valve: The tricuspid valve is normal in structure. Tricuspid valve regurgitation is  mild . No evidence of tricuspid stenosis. Aortic Valve: The aortic valve is calcified. There is severe calcifcation of the aortic valve. Aortic valve regurgitation is not visualized. Moderate to severe aortic stenosis is present. Aortic valve mean gradient measures 31.0 mmHg. Aortic valve peak gradient measures 50.1 mmHg. Aortic valve area, by VTI measures 0.61 cm. Pulmonic Valve: The pulmonic valve was grossly normal. Pulmonic valve regurgitation is mild. No evidence of pulmonic stenosis. Aorta: The aortic root and ascending aorta are structurally normal, with no evidence of dilitation. Venous: The inferior vena cava is dilated in size with less than 50% respiratory variability, suggesting right atrial pressure of 15 mmHg. IAS/Shunts: There is right bowing of the interatrial septum, suggestive of elevated left atrial pressure. No atrial level shunt detected by color flow Doppler.  LEFT VENTRICLE PLAX 2D LVIDd:         4.10 cm   Diastology LVIDs:         2.80 cm   LV e' medial:    6.81 cm/s LV PW:         1.20 cm   LV E/e' medial:  21.3 LV IVS:        1.20 cm   LV e' lateral:   10.90 cm/s LVOT diam:     1.70 cm   LV E/e' lateral: 13.3 LV SV:         47 LV SV Index:   27 LVOT Area:     2.27 cm  LEFT ATRIUM              Index        RIGHT ATRIUM           Index LA diam:        4.30 cm  2.50 cm/m   RA Area:     22.90 cm LA Vol (A2C):   104.0 ml 60.42 ml/m  RA Volume:   62.30 ml  36.19 ml/m LA Vol (A4C):   101.0 ml 58.67 ml/m LA Biplane Vol: 109.0 ml 63.32 ml/m  AORTIC VALVE                      PULMONIC VALVE AV Area (Vmax):    0.59 cm      PR End Diast Vel: 6.81 msec AV Area (Vmean):   0.57 cm AV Area (VTI):     0.61 cm AV Vmax:           354.00 cm/s AV Vmean:          238.667 cm/s AV VTI:            0.767 m AV Peak Grad:      50.1 mmHg AV Mean Grad:      31.0 mmHg LVOT Vmax:         91.30 cm/s LVOT Vmean:        60.400 cm/s LVOT VTI:          0.205 m LVOT/AV VTI ratio: 0.27  AORTA Ao Root diam: 3.10 cm Ao Asc diam:  2.90 cm MITRAL VALVE                TRICUSPID VALVE MV Area (PHT): 5.13 cm     TR Peak grad:   75.3 mmHg MV Area VTI:   1.27 cm     TR Vmax:        434.00 cm/s MV Peak grad:  9.0 mmHg MV Mean grad:  3.0 mmHg     SHUNTS  MV Vmax:       1.50 m/s     Systemic VTI:  0.20 m MV Vmean:      76.9 cm/s    Systemic Diam: 1.70 cm MV Decel Time: 148 msec MR Peak grad: 150.1 mmHg MR Mean grad: 97.0 mmHg MR Vmax:      612.50 cm/s MR Vmean:     464.0 cm/s MV E velocity: 145.00 cm/s MV A velocity: 80.80 cm/s MV E/A ratio:  1.79 Mikella Linsley Priya Maclovia Uher Electronically signed by Winfield Rast Sharyon Peitz Signature Date/Time: 04/07/2023/5:18:59 PM    Final      Assessment & Plan   Patient is a 89 year old F known to have paroxysmal A-fib, moderate to severe aortic valve stenosis and moderate mitral valve regurgitation by echo in 1/24, chronic diastolic heart failure, myelodysplastic syndrome (anemia and thrombocytopenia) COPD on home oxygen 2L, breast cancer s/p lumpectomy and radiation, history of lung cancer s/p LUL lobectomy presented to ER with DOE and chest pain x 6 weeks.    # NSTEMI -Presented with DOE and chest pain x 6 weeks, EKG showed NSR with no ischemia (nonspecific old T wave inversions in inferior leads) and high sensitive troponins were elevated, 319>> 325.  Echocardiogram from 1/24 showed normal LVEF, grade 3 diastolic dysfunction, moderate to severe aortic valve stenosis and mild to moderate MR.  Will obtain 2D echocardiogram in this visit. Due to history of myelo-dysplastic  syndrome with chronic anemia and thrombocytopenia, patient is not a candidate for any invasive ischemia evaluation. Not a candidate for antiplatelet or anticoagulation either. Recommend symptomatic management with isosorbide mononitrate 30 mg once daily.  Keep hemoglobin more than 8.   # Acute on chronic diastolic heart failure exacerbation -Presenting with DOE x 6 weeks associated with chest pain.  Continue IV Lasix 20 mg daily.   # Moderate to severe aortic valve stenosis by echo in 5/24 (mean AV gradient increased from 22 mmHg in 1/24 to 31 mmHg in 5/24) # Mild to moderate mitral valve regurgitation by echo in 1/24 -Will repeat echocardiogram in 6 months. I spoke with the grandson, Ysabelle Wedding (psychiatry resident at [facility]) and updated the plan. Strongly recommended outpatient palliative care consultation [place].   # Fusiform infrarenal abdominal aortic aneurysm, 4.8 cm per 1/24 CT imaging -Not a candidate for surgical intervention due to chronic anemia/thrombocytopenia from MDS, multiple comorbidities and advanced age.  Can forego further CT imaging in the future.   # Myelodysplastic syndrome with chronic anemia and thrombocytopenia -Per primary team and oncology   I have spent a total of 30 minutes with patient reviewing chart , telemetry, EKGs, labs and examining patient as well as establishing an assessment and plan that was discussed with the patient.  > 50% of time was spent in direct patient care.     Signed, Marjo Bicker, MD  04/08/2023, 12:08 PM

## 2023-04-08 NOTE — Plan of Care (Signed)
  Problem: Clinical Measurements: Goal: Respiratory complications will improve Outcome: Progressing   Problem: Skin Integrity: Goal: Risk for impaired skin integrity will decrease Outcome: Progressing   

## 2023-04-08 NOTE — Progress Notes (Signed)
   04/08/23 0607  Vitals  BP (!) 175/88  BP Location Left Arm  BP Method Automatic  MEWS COLOR  MEWS Score Color Green  MEWS Score  MEWS Temp 0  MEWS Systolic 0  MEWS Pulse 0  MEWS RR 0  MEWS LOC 0  MEWS Score 0   Dr. Thomes Dinning made aware, awaiting orders

## 2023-04-08 NOTE — Progress Notes (Signed)
PROGRESS NOTE  Lisa [name]  WUJ:[id number] DOB: Nov 17, 1933 DOA: 04/06/2023 PCP: [name], MD   Chief Complaint  Patient presents with   Fatigue   Level of care: Telemetry  Brief Admission History:  89 year old female with a history of myelodysplastic syndrome, COPD, hypertension, chronic respiratory failure on 2 L, paroxysmal atrial fibrillation on aspirin, lung adenocarcinoma status post LUL lobectomy, breast cancer, and HFpEF presenting with 3-day history of generalized weakness, shortness of breath, and decreased oral intake.  The patient's daughter at the bedside also relates that she has been a little bit confused.  The patient states that she has had more more difficulty getting out of bed and getting back into bed secondary to generalized weakness.  She has had some subjective fevers and chills.  She states that she has had more dyspnea on exertion.  She has also been complaining of intermittent chest discomfort with exertion over the past month.  She denies any headache, neck pain, nausea, vomiting, diarrhea.  She has had some loose stools without any hematochezia.  She has had some dark stools.  She denies any hematemesis.  She denies any dysuria or hematuria.  Daughter states that the patient has also had decreased oral intake.  Notably, the patient recently saw Dr. [name] in the office on 04/01/2023.  There are plans for bone marrow biopsy to be done.  Because of the above symptoms, she presented for further evaluation and treatment. In the ED, the patient was afebrile hemodynamically stable with oxygen saturation 95% on 2 L.  WBC 2.9, hemoglobin 10.0, platelets 66,000.  Sodium 128, potassium 5.4, bicarbonate 20, serum creatinine 0.93.  Chest x-ray showed chronic interstitial markings with hyperinflation.  Troponin 319.  EKG showed sinus rhythm with nonspecific STT changes.   Assessment and Plan:  NSTEMI  - being medically managed by cardiology - started imdur 30 mg daily per  cardiology - no chest pain symptoms at this time - per cardiology not a candidate for invasive or aggressive procedures/treatments - transfuse 1 unit PRBC to keep Hg>8  Mild COPD exacerbation  - continue bronchodilators  - wean solumedrol as breathing continues to improve   ABLA with melena (guaiac positive) - appreciate GI team consultation - s/p 1 unit PRBC transfusion on 6/4 - Hg up to 7.8, transfuse 1 unit PRBC to keep Hg>8 per cardiology - continue PPI twice daily per GI - EGD on hold for now per GI given stable Hg and high risk for procedures  Chronic HFpEF -IV furosemide 20 mg BID per cardiology service  -11/04/22 Echo EF 65-70%, no WMA, G2DD   Chronic respiratory failure with hypoxia -Chronically on 2 L at home   Thrombocytopenia/leukopenia -This has been chronic likely secondary to the patient's myelodysplastic syndrome -There are plans for bone marrow biopsy per Dr. [name]   Paroxysmal Atrial fib -currently in sinus -Continue metoprolol succinate -not a candidate for anticoagulation due to MDS and chronic anemia   Hyperkalemia - treated  -Lokelma ordered -Treated with temporizing measures   Hyponatremia -Secondary to volume depletion and poor solute intake -Judicious IV fluids completed  -Na up to 132   DVT prophylaxis: SCD Code Status: Full  Family Communication: daughter at bedside updated 6/6, t/c to grandson 6/6   Disposition: Status is: Inpatient Remains inpatient appropriate because: anticipate DC home in 1-2 days    Consultants:  GI Cardiology   Procedures:   Antimicrobials:    Subjective: Pt having no SOB symptoms or CP and asking about going  home.  No black stool seen.   Objective: Vitals:   04/08/23 1055 04/08/23 1111 04/08/23 1315 04/08/23 1337  BP: (!) 147/78 (!) 157/78 (!) 159/98 (!) 159/84  Pulse: 84 85 78 81  Resp: 17 18 17    Temp: 98 F (36.7 C) 98 F (36.7 C) 98.2 F (36.8 C) 98.4 F (36.9 C)  TempSrc: Oral  Oral Oral   SpO2: 99%  99% 95%  Weight:      Height:        Intake/Output Summary (Last 24 hours) at 04/08/2023 1340 Last data filed at 04/08/2023 1300 Gross per 24 hour  Intake 1416.94 ml  Output 3350 ml  Net -1933.06 ml   Filed Weights   04/06/23 1142 04/06/23 1500  Weight: 69 kg 67.1 kg   Examination:  General exam: frail, elderly female, awake, alert, cooperative. Appears calm and comfortable  Respiratory system: lungs sound clear today with no wheezing heard. Cardiovascular system: normal S1 & S2 heard. No JVD, murmurs, rubs, gallops or clicks. No pedal edema. Gastrointestinal system: Abdomen is nondistended, soft and nontender. No organomegaly or masses felt. Normal bowel sounds heard. Central nervous system: Alert and oriented. Extremities: Symmetric 5 x 5 power. Skin: No rashes, lesions or ulcers. Psychiatry: Judgement and insight appear normal. Mood & affect appropriate.   Data Reviewed: I have personally reviewed following labs and imaging studies  CBC: Recent Labs  Lab 04/06/23 1205 04/07/23 0430 04/08/23 0415  WBC 2.9* 1.5* 1.9*  NEUTROABS 0.3*  --   --   HGB 7.0* 7.7* 7.8*  HCT 23.9* 25.0* 25.4*  MCV 98.8 94.7 95.5  PLT 66* 64* 77*    Basic Metabolic Panel: Recent Labs  Lab 04/06/23 1205 04/08/23 0415  NA 128* 132*  K 5.4* 4.8  CL 93* 94*  CO2 28 28  GLUCOSE 102* 136*  BUN 22 25*  CREATININE 0.93 1.15*  CALCIUM 8.7* 9.0  MG  --  1.8    CBG: No results for input(s): "GLUCAP" in the last 168 hours.  Recent Results (from the past 240 hour(s))  Culture, blood (Routine X 2) w Reflex to ID Panel     Status: None (Preliminary result)   Collection Time: 04/06/23  3:33 PM   Specimen: BLOOD LEFT ARM  Result Value Ref Range Status   Specimen Description BLOOD LEFT ARM  Final   Special Requests   Final    BOTTLES DRAWN AEROBIC AND ANAEROBIC Blood Culture adequate volume   Culture   Final    NO GROWTH 2 DAYS Performed at [facility], [address],  [place], [place] [address]    Report Status PENDING  Incomplete  Culture, blood (Routine X 2) w Reflex to ID Panel     Status: None (Preliminary result)   Collection Time: 04/06/23  3:44 PM   Specimen: BLOOD RIGHT ARM  Result Value Ref Range Status   Specimen Description BLOOD RIGHT ARM  Final   Special Requests   Final    BOTTLES DRAWN AEROBIC AND ANAEROBIC Blood Culture adequate volume   Culture   Final    NO GROWTH 2 DAYS Performed at [facility], [address] [place], [place] [address]    Report Status PENDING  Incomplete  Urine Culture (for pregnant, neutropenic or urologic patients or patients with an indwelling urinary catheter)     Status: Abnormal   Collection Time: 04/06/23  4:50 PM   Specimen: Urine, Clean Catch  Result Value Ref Range Status   Specimen  Description   Final    URINE, CLEAN CATCH Performed at [facility], [address], [place], [place] [address]    Special Requests   Final    NONE Performed at [facility], [address] [place], [place] [address]    Culture MULTIPLE SPECIES PRESENT, SUGGEST RECOLLECTION (A)  Final   Report Status 04/07/2023 FINAL  Final  Respiratory (~20 pathogens) panel by PCR     Status: None   Collection Time: 04/06/23 10:50 PM   Specimen: Nasopharyngeal Swab; Respiratory  Result Value Ref Range Status   Adenovirus NOT DETECTED NOT DETECTED Final   Coronavirus 229E NOT DETECTED NOT DETECTED Final    Comment: (NOTE) The Coronavirus on the Respiratory Panel, DOES NOT test for the novel  Coronavirus (2019 nCoV)    Coronavirus HKU1 NOT DETECTED NOT DETECTED Final   Coronavirus NL63 NOT DETECTED NOT DETECTED Final   Coronavirus OC43 NOT DETECTED NOT DETECTED Final   Metapneumovirus NOT DETECTED NOT DETECTED Final   Rhinovirus / Enterovirus NOT DETECTED NOT DETECTED Final   Influenza A NOT DETECTED NOT DETECTED Final   Influenza B NOT DETECTED NOT DETECTED Final   Parainfluenza Virus 1 NOT DETECTED NOT DETECTED Final    Parainfluenza Virus 2 NOT DETECTED NOT DETECTED Final   Parainfluenza Virus 3 NOT DETECTED NOT DETECTED Final   Parainfluenza Virus 4 NOT DETECTED NOT DETECTED Final   Respiratory Syncytial Virus NOT DETECTED NOT DETECTED Final   Bordetella pertussis NOT DETECTED NOT DETECTED Final   Bordetella Parapertussis NOT DETECTED NOT DETECTED Final   Chlamydophila pneumoniae NOT DETECTED NOT DETECTED Final   Mycoplasma pneumoniae NOT DETECTED NOT DETECTED Final    Comment: Performed at [facility], [address], [place], [place] [address]     Radiology Studies: ECHOCARDIOGRAM COMPLETE  Result Date: 04/07/2023    ECHOCARDIOGRAM REPORT   Patient Name:   Samanthamarie Marinos Date of Exam: 04/07/2023 Medical Rec #:  [id number]      Height:       64.0 in Accession #:    [id number]     Weight:       148.0 lb Date of Birth:  06-Jul-1934      BSA:          1.721 m Patient Age:    89 years       BP:           173/83 mmHg Patient Gender: F              HR:           71 bpm. [facility] Procedure: 2D Echo, Cardiac Doppler and Color Doppler Indications:    Elevated Troponin  History:        Patient has prior history of Echocardiogram examinations, most                 recent 11/04/2022. NSTEMI, COPD, Aortic Valve Disease,                 Arrythmias:Atrial Fibrillation; Risk Factors:Former Smoker.  Sonographer:    Aron Baba Referring Phys: [phone] DAVID TAT IMPRESSIONS  1. Left ventricular ejection fraction, by estimation, is 60 to 65%. The left ventricle has normal function. The left ventricle has no regional wall motion abnormalities. There is mild left ventricular hypertrophy. Left ventricular diastolic parameters are consistent with Grade II diastolic dysfunction (pseudonormalization).  2. Right ventricular systolic function is mildly reduced. The right ventricular size is mildly enlarged.  There is severely elevated pulmonary artery systolic pressure. The estimated right ventricular systolic pressure is 90.3  mmHg.  3. Left atrial size was severely dilated.  4. Right atrial size was severely dilated.  5. The mitral valve is normal in structure. Mild mitral valve regurgitation. No evidence of mitral stenosis.  6. The aortic valve is calcified. There is severe calcifcation of the aortic valve. Aortic valve regurgitation is not visualized. Moderate to severe aortic valve stenosis. Aortic valve mean gradient measures 31.0 mmHg. Aortic valve Vmax measures 3.54 m/s. DVI is 0.26.  7. The inferior vena cava is dilated in size with <50% respiratory variability, suggesting right atrial pressure of 15 mmHg. Comparison(s): Changes from prior study are noted. Moderate to severe aortic valve stenosis, Mean AV gradient increased from 22 mm Hg in 11/2022 to 31 mm Hg now. FINDINGS  Left Ventricle: Left ventricular ejection fraction, by estimation, is 60 to 65%. The left ventricle has normal function. The left ventricle has no regional wall motion abnormalities. The left ventricular internal cavity size was normal in size. There is  mild left ventricular hypertrophy. Left ventricular diastolic parameters are consistent with Grade II diastolic dysfunction (pseudonormalization). Right Ventricle: The right ventricular size is mildly enlarged. No increase in right ventricular wall thickness. Right ventricular systolic function is mildly reduced. There is severely elevated pulmonary artery systolic pressure. The tricuspid regurgitant velocity is 4.34 m/s, and with an assumed right atrial pressure of 15 mmHg, the estimated right ventricular systolic pressure is 90.3 mmHg. Left Atrium: Left atrial size was severely dilated. Right Atrium: Right atrial size was severely dilated. Pericardium: There is no evidence of pericardial effusion. Mitral Valve: The mitral valve is normal in structure. Mild mitral valve regurgitation. No evidence of mitral valve stenosis. MV peak gradient, 9.0 mmHg. The mean mitral valve gradient is 3.0 mmHg. Tricuspid Valve:  The tricuspid valve is normal in structure. Tricuspid valve regurgitation is mild . No evidence of tricuspid stenosis. Aortic Valve: The aortic valve is calcified. There is severe calcifcation of the aortic valve. Aortic valve regurgitation is not visualized. Moderate to severe aortic stenosis is present. Aortic valve mean gradient measures 31.0 mmHg. Aortic valve peak gradient measures 50.1 mmHg. Aortic valve area, by VTI measures 0.61 cm. Pulmonic Valve: The pulmonic valve was grossly normal. Pulmonic valve regurgitation is mild. No evidence of pulmonic stenosis. Aorta: The aortic root and ascending aorta are structurally normal, with no evidence of dilitation. Venous: The inferior vena cava is dilated in size with less than 50% respiratory variability, suggesting right atrial pressure of 15 mmHg. IAS/Shunts: There is right bowing of the interatrial septum, suggestive of elevated left atrial pressure. No atrial level shunt detected by color flow Doppler.  LEFT VENTRICLE PLAX 2D LVIDd:         4.10 cm   Diastology LVIDs:         2.80 cm   LV e' medial:    6.81 cm/s LV PW:         1.20 cm   LV E/e' medial:  21.3 LV IVS:        1.20 cm   LV e' lateral:   10.90 cm/s LVOT diam:     1.70 cm   LV E/e' lateral: 13.3 LV SV:         47 LV SV Index:   27 LVOT Area:     2.27 cm  LEFT ATRIUM              Index  RIGHT ATRIUM           Index LA diam:        4.30 cm  2.50 cm/m   RA Area:     22.90 cm LA Vol (A2C):   104.0 ml 60.42 ml/m  RA Volume:   62.30 ml  36.19 ml/m LA Vol (A4C):   101.0 ml 58.67 ml/m LA Biplane Vol: 109.0 ml 63.32 ml/m  AORTIC VALVE                     PULMONIC VALVE AV Area (Vmax):    0.59 cm      PR End Diast Vel: 6.81 msec AV Area (Vmean):   0.57 cm AV Area (VTI):     0.61 cm AV Vmax:           354.00 cm/s AV Vmean:          238.667 cm/s AV VTI:            0.767 m AV Peak Grad:      50.1 mmHg AV Mean Grad:      31.0 mmHg LVOT Vmax:         91.30 cm/s LVOT Vmean:        60.400 cm/s LVOT  VTI:          0.205 m LVOT/AV VTI ratio: 0.27  AORTA Ao Root diam: 3.10 cm Ao Asc diam:  2.90 cm MITRAL VALVE                TRICUSPID VALVE MV Area (PHT): 5.13 cm     TR Peak grad:   75.3 mmHg MV Area VTI:   1.27 cm     TR Vmax:        434.00 cm/s MV Peak grad:  9.0 mmHg MV Mean grad:  3.0 mmHg     SHUNTS MV Vmax:       1.50 m/s     Systemic VTI:  0.20 m MV Vmean:      76.9 cm/s    Systemic Diam: 1.70 cm MV Decel Time: 148 msec MR Peak grad: 150.1 mmHg MR Mean grad: 97.0 mmHg MR Vmax:      612.50 cm/s MR Vmean:     464.0 cm/s MV E velocity: 145.00 cm/s MV A velocity: 80.80 cm/s MV E/A ratio:  1.79 Vishnu Priya Mallipeddi Electronically signed by Winfield Rast Mallipeddi Signature Date/Time: 04/07/2023/5:18:59 PM    Final     Scheduled Meds:  arformoterol  15 mcg Nebulization BID   budesonide (PULMICORT) nebulizer solution  0.5 mg Nebulization BID   furosemide  20 mg Intravenous BID   ipratropium-albuterol  3 mL Nebulization Q6H   isosorbide mononitrate  30 mg Oral Daily   levothyroxine  100 mcg Oral QAC breakfast   magnesium oxide  400 mg Oral BID   methylPREDNISolone (SOLU-MEDROL) injection  40 mg Intravenous Q12H   metoprolol succinate  25 mg Oral Daily   mirtazapine  7.5 mg Oral QHS   multivitamin with minerals  1 tablet Oral Daily   pantoprazole (PROTONIX) IV  40 mg Intravenous Q12H   sodium zirconium cyclosilicate  10 g Oral Daily   Continuous Infusions:   LOS: 1 day   Time spent: 37 mins  Akira Adelsberger Laural Benes, MD How to contact the [facility] Attending or Consulting provider 7A - 7P or covering provider during after hours 7P -7A, for this patient?  Check the care team in [facility] and look for a) attending/consulting TR[facility]  provider listed and b) the [facility] team listed Log into [email or web address] and use [place]'s universal password to access. If you do not have the password, please contact the hospital operator. Locate the [facility] provider you are looking for under [facility] and page to a number that  you can be directly reached. If you still have difficulty reaching the provider, please page the [facility] (Director on Call) for the Hospitalists listed on amion for assistance.  04/08/2023, 1:40 PM

## 2023-04-08 NOTE — Progress Notes (Signed)
Patient slept on and off through the night, up to [facility] multiple times with assist. Patient presented with complaints of pain earlier in the night and requested PRN tylenol, tylenol effective.

## 2023-04-08 NOTE — Progress Notes (Signed)
Gastroenterology Progress Note   Referring Provider: No ref. provider found Primary Care Physician:  [name], MD Primary Gastroenterologist:  Dr. Jena Gauss  Patient ID: Lisa [name]; [id number]; 10-19-1934   Subjective:    Patient tolerated breakfast.  Feeling better today.  Denies abdominal pain, nausea, vomiting.  No further black stools.  No rectal bleeding.  Objective:   Vital signs in last 24 hours: Temp:  [97.7 F (36.5 C)-98.6 F (37 C)] 98.6 F (37 C) (06/06 0318) Pulse Rate:  [68-89] 68 (06/06 0318) Resp:  [18-22] 18 (06/06 0318) BP: (150-176)/(71-88) 159/71 (06/06 0648) SpO2:  [94 %-100 %] 100 % (06/06 0318) Last BM Date : 04/07/23 General:   Alert,  Well-developed, well-nourished, pleasant and cooperative in NAD Head:  Normocephalic and atraumatic. Eyes:  Sclera clear, no icterus.  Abdomen:  Soft, nontender and nondistended. No masses, hepatosplenomegaly or hernias noted.  Extremities:  Without clubbing, deformity or edema. Neurologic:  Alert and  oriented x4;  grossly normal neurologically. Skin:  Intact without significant lesions or rashes. Psych:  Alert and cooperative. Normal mood and affect.  Intake/Output from previous day: 06/05 0701 - 06/06 0700 In: 1101.9 [P.O.:480; I.V.:621.9] Out: 2050 [Urine:2050] Intake/Output this shift: No intake/output data recorded.  Lab Results: CBC Recent Labs    04/06/23 1205 04/07/23 0430 04/08/23 0415  WBC 2.9* 1.5* 1.9*  HGB 7.0* 7.7* 7.8*  HCT 23.9* 25.0* 25.4*  MCV 98.8 94.7 95.5  PLT 66* 64* 77*   BMET Recent Labs    04/06/23 1205 04/08/23 0415  NA 128* 132*  K 5.4* 4.8  CL 93* 94*  CO2 28 28  GLUCOSE 102* 136*  BUN 22 25*  CREATININE 0.93 1.15*  CALCIUM 8.7* 9.0   LFTs Recent Labs    04/06/23 1205  BILITOT 0.8  ALKPHOS 36*  AST 32  ALT 15  PROT 8.8*  ALBUMIN 3.2*   No results for input(s): "LIPASE" in the last 72 hours. PT/INR No results for input(s): "LABPROT", "INR" in the  last 72 hours.       Imaging Studies: ECHOCARDIOGRAM COMPLETE  Result Date: 04/07/2023    ECHOCARDIOGRAM REPORT   Patient Name:   Lisa [name] Date of Exam: 04/07/2023 Medical Rec #:  [id number]      Height:       64.0 in Accession #:    [id number]     Weight:       148.0 lb Date of Birth:  1934/02/23      BSA:          1.721 m Patient Age:    89 years       BP:           173/83 mmHg Patient Gender: F              HR:           71 bpm. [facility] Procedure: 2D Echo, Cardiac Doppler and Color Doppler Indications:    Elevated Troponin  History:        Patient has prior history of Echocardiogram examinations, most                 recent 11/04/2022. NSTEMI, COPD, Aortic Valve Disease,                 Arrythmias:Atrial Fibrillation; Risk Factors:Former Smoker.  Sonographer:    Aron Baba Referring Phys: [id number] DAVID TAT IMPRESSIONS  1. Left ventricular ejection fraction, by estimation, is 60 to  65%. The left ventricle has normal function. The left ventricle has no regional wall motion abnormalities. There is mild left ventricular hypertrophy. Left ventricular diastolic parameters are consistent with Grade II diastolic dysfunction (pseudonormalization).  2. Right ventricular systolic function is mildly reduced. The right ventricular size is mildly enlarged. There is severely elevated pulmonary artery systolic pressure. The estimated right ventricular systolic pressure is 90.3 mmHg.  3. Left atrial size was severely dilated.  4. Right atrial size was severely dilated.  5. The mitral valve is normal in structure. Mild mitral valve regurgitation. No evidence of mitral stenosis.  6. The aortic valve is calcified. There is severe calcifcation of the aortic valve. Aortic valve regurgitation is not visualized. Moderate to severe aortic valve stenosis. Aortic valve mean gradient measures 31.0 mmHg. Aortic valve Vmax measures 3.54 m/s. DVI is 0.26.  7. The inferior vena cava is dilated in size with <50%  respiratory variability, suggesting right atrial pressure of 15 mmHg. Comparison(s): Changes from prior study are noted. Moderate to severe aortic valve stenosis, Mean AV gradient increased from 22 mm Hg in 11/2022 to 31 mm Hg now. FINDINGS  Left Ventricle: Left ventricular ejection fraction, by estimation, is 60 to 65%. The left ventricle has normal function. The left ventricle has no regional wall motion abnormalities. The left ventricular internal cavity size was normal in size. There is  mild left ventricular hypertrophy. Left ventricular diastolic parameters are consistent with Grade II diastolic dysfunction (pseudonormalization). Right Ventricle: The right ventricular size is mildly enlarged. No increase in right ventricular wall thickness. Right ventricular systolic function is mildly reduced. There is severely elevated pulmonary artery systolic pressure. The tricuspid regurgitant velocity is 4.34 m/s, and with an assumed right atrial pressure of 15 mmHg, the estimated right ventricular systolic pressure is 90.3 mmHg. Left Atrium: Left atrial size was severely dilated. Right Atrium: Right atrial size was severely dilated. Pericardium: There is no evidence of pericardial effusion. Mitral Valve: The mitral valve is normal in structure. Mild mitral valve regurgitation. No evidence of mitral valve stenosis. MV peak gradient, 9.0 mmHg. The mean mitral valve gradient is 3.0 mmHg. Tricuspid Valve: The tricuspid valve is normal in structure. Tricuspid valve regurgitation is mild . No evidence of tricuspid stenosis. Aortic Valve: The aortic valve is calcified. There is severe calcifcation of the aortic valve. Aortic valve regurgitation is not visualized. Moderate to severe aortic stenosis is present. Aortic valve mean gradient measures 31.0 mmHg. Aortic valve peak gradient measures 50.1 mmHg. Aortic valve area, by VTI measures 0.61 cm. Pulmonic Valve: The pulmonic valve was grossly normal. Pulmonic valve  regurgitation is mild. No evidence of pulmonic stenosis. Aorta: The aortic root and ascending aorta are structurally normal, with no evidence of dilitation. Venous: The inferior vena cava is dilated in size with less than 50% respiratory variability, suggesting right atrial pressure of 15 mmHg. IAS/Shunts: There is right bowing of the interatrial septum, suggestive of elevated left atrial pressure. No atrial level shunt detected by color flow Doppler.  LEFT VENTRICLE PLAX 2D LVIDd:         4.10 cm   Diastology LVIDs:         2.80 cm   LV e' medial:    6.81 cm/s LV PW:         1.20 cm   LV E/e' medial:  21.3 LV IVS:        1.20 cm   LV e' lateral:   10.90 cm/s LVOT diam:  1.70 cm   LV E/e' lateral: 13.3 LV SV:         47 LV SV Index:   27 LVOT Area:     2.27 cm  LEFT ATRIUM              Index        RIGHT ATRIUM           Index LA diam:        4.30 cm  2.50 cm/m   RA Area:     22.90 cm LA Vol (A2C):   104.0 ml 60.42 ml/m  RA Volume:   62.30 ml  36.19 ml/m LA Vol (A4C):   101.0 ml 58.67 ml/m LA Biplane Vol: 109.0 ml 63.32 ml/m  AORTIC VALVE                     PULMONIC VALVE AV Area (Vmax):    0.59 cm      PR End Diast Vel: 6.81 msec AV Area (Vmean):   0.57 cm AV Area (VTI):     0.61 cm AV Vmax:           354.00 cm/s AV Vmean:          238.667 cm/s AV VTI:            0.767 m AV Peak Grad:      50.1 mmHg AV Mean Grad:      31.0 mmHg LVOT Vmax:         91.30 cm/s LVOT Vmean:        60.400 cm/s LVOT VTI:          0.205 m LVOT/AV VTI ratio: 0.27  AORTA Ao Root diam: 3.10 cm Ao Asc diam:  2.90 cm MITRAL VALVE                TRICUSPID VALVE MV Area (PHT): 5.13 cm     TR Peak grad:   75.3 mmHg MV Area VTI:   1.27 cm     TR Vmax:        434.00 cm/s MV Peak grad:  9.0 mmHg MV Mean grad:  3.0 mmHg     SHUNTS MV Vmax:       1.50 m/s     Systemic VTI:  0.20 m MV Vmean:      76.9 cm/s    Systemic Diam: 1.70 cm MV Decel Time: 148 msec MR Peak grad: 150.1 mmHg MR Mean grad: 97.0 mmHg MR Vmax:      612.50 cm/s MR  Vmean:     464.0 cm/s MV E velocity: 145.00 cm/s MV A velocity: 80.80 cm/s MV E/A ratio:  1.79 Vishnu Priya Mallipeddi Electronically signed by Winfield Rast Mallipeddi Signature Date/Time: 04/07/2023/5:18:59 PM    Final    DG Chest Port 1 View  Result Date: 04/06/2023 CLINICAL DATA:  Shortness of breath EXAM: PORTABLE CHEST 1 VIEW COMPARISON:  11/03/2022 FINDINGS: Mild cardiomegaly. Aortic atherosclerosis. There is hyperinflation of the lungs compatible with COPD. Mild interstitial prominence is similar to prior study, likely chronic lung disease. Bibasilar linear densities compatible with atelectasis or scarring. No effusions. IMPRESSION: COPD/chronic changes. Cardiomegaly. Bibasilar atelectasis or scarring. Electronically Signed   By: Charlett Nose M.D.   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  GI consulted for anemia.  Acute on chronic anemia with possible melena: Chronic anemia in the setting of possible CMML/other MDS, followed by hematology, receiving Aranesp with planned bone marrow biopsy for further evaluation.  Hemoglobin range 9-10 at baseline.  1 week ago her hemoglobin was 8.3.  On admission hemoglobin 7.0.  She received 1 unit of packed red blood cells and hemoglobin was 7.7 yesterday and stable at 7.8 today.  Stool Hemoccult has been ordered. No stool since.   Iron studies on May 30 within normal limits.  She  reports intermittent black stools though had been taking Pepto-Bismol for loose stools.  According to the daughter she had been taking Pepto-Bismol around the time that the black stools were noted.  Bowel movement yesterday brown in color per patient.  No overt bleeding noted.  She denies any NSAIDs.  She is not on any anticoagulants.  Suspect her anemia is more related to her hematological disorder.  Black stools likely secondary to Pepto-Bismol use.  Holding off EGD for now. She is currently not a candidate for invasive studies in setting of multiple comorbidities, including her moderate to severe aortic stenosis, NSTEMI, acute on chronic CHF.  Patient's grandson, Dr. Georgiana Shore, updated by phone today.    Plan:   Monitor H&H, transfuse for hemoglobin less than 7. Stool Hemoccult as available. Monitor for further black stools or bloody stools. Recommend continuing PPI twice daily empirically for now could reduce to once daily in two months. Hold off on EGD for now, if she develops evidence of GI bleeding/further decline of hemoglobin and requires endoscopic evaluation she will need to have cardiac clearance.   GI to sign off, please reach out with any questions/concerns.    LOS: 1 day   Leanna Battles. Dixon Boos [facility] [phone] 6/6/20247:35 AM

## 2023-04-09 ENCOUNTER — Other Ambulatory Visit: Payer: Self-pay | Admitting: *Deleted

## 2023-04-09 DIAGNOSIS — I214 Non-ST elevation (NSTEMI) myocardial infarction: Secondary | ICD-10-CM

## 2023-04-09 DIAGNOSIS — D469 Myelodysplastic syndrome, unspecified: Secondary | ICD-10-CM | POA: Diagnosis not present

## 2023-04-09 DIAGNOSIS — D61818 Other pancytopenia: Secondary | ICD-10-CM | POA: Diagnosis not present

## 2023-04-09 DIAGNOSIS — I350 Nonrheumatic aortic (valve) stenosis: Secondary | ICD-10-CM | POA: Diagnosis not present

## 2023-04-09 DIAGNOSIS — J9611 Chronic respiratory failure with hypoxia: Secondary | ICD-10-CM | POA: Diagnosis not present

## 2023-04-09 DIAGNOSIS — I5033 Acute on chronic diastolic (congestive) heart failure: Secondary | ICD-10-CM | POA: Diagnosis not present

## 2023-04-09 DIAGNOSIS — J441 Chronic obstructive pulmonary disease with (acute) exacerbation: Secondary | ICD-10-CM | POA: Diagnosis not present

## 2023-04-09 LAB — BPAM RBC
Blood Product Expiration Date: 202407052359
Unit Type and Rh: 1700
Unit Type and Rh: 5100

## 2023-04-09 LAB — LIPID PANEL
Cholesterol: 132 mg/dL (ref 0–200)
HDL: 35 mg/dL — ABNORMAL LOW (ref >40)
LDL Cholesterol: 80 mg/dL (ref 0–99)
Total CHOL/HDL Ratio: 3.8 RATIO
Triglycerides: 86 mg/dL (ref <150)
VLDL: 17 mg/dL (ref 0–40)

## 2023-04-09 LAB — CBC
HCT: 30.9 % — ABNORMAL LOW (ref 36.0–46.0)
Hemoglobin: 9.4 g/dL — ABNORMAL LOW (ref 12.0–15.0)
MCH: 28.4 pg (ref 26.0–34.0)
MCHC: 30.4 g/dL (ref 30.0–36.0)
MCV: 93.4 fL (ref 80.0–100.0)
Platelets: 96 10*3/uL — ABNORMAL LOW (ref 150–400)
RBC: 3.31 MIL/uL — ABNORMAL LOW (ref 3.87–5.11)
RDW: 18.7 % — ABNORMAL HIGH (ref 11.5–15.5)
WBC: 5.3 10*3/uL (ref 4.0–10.5)
nRBC: 7.2 % — ABNORMAL HIGH (ref 0.0–0.2)

## 2023-04-09 LAB — BASIC METABOLIC PANEL
Anion gap: 10 (ref 5–15)
BUN: 28 mg/dL — ABNORMAL HIGH (ref 8–23)
CO2: 32 mmol/L (ref 22–32)
Calcium: 9.6 mg/dL (ref 8.9–10.3)
Chloride: 94 mmol/L — ABNORMAL LOW (ref 98–111)
Creatinine, Ser: 1.09 mg/dL — ABNORMAL HIGH (ref 0.44–1.00)
GFR, Estimated: 49 mL/min — ABNORMAL LOW (ref >60)
Glucose, Bld: 85 mg/dL (ref 70–99)
Potassium: 3.5 mmol/L (ref 3.5–5.1)
Sodium: 136 mmol/L (ref 135–145)

## 2023-04-09 LAB — TYPE AND SCREEN: ABO/RH(D): B POS

## 2023-04-09 LAB — MAGNESIUM: Magnesium: 2.1 mg/dL (ref 1.7–2.4)

## 2023-04-09 LAB — PATHOLOGIST SMEAR REVIEW

## 2023-04-09 MED ORDER — PANTOPRAZOLE SODIUM 40 MG PO TBEC: 40.0000 mg | DELAYED_RELEASE_TABLET | Freq: Two times a day (BID) | ORAL | 1 refills | Status: DC

## 2023-04-09 MED ORDER — PANTOPRAZOLE SODIUM 40 MG PO TBEC: 40.0000 mg | DELAYED_RELEASE_TABLET | Freq: Two times a day (BID) | ORAL | Status: DC

## 2023-04-09 MED ORDER — ISOSORBIDE MONONITRATE ER 30 MG PO TB24: 30.0000 mg | ORAL_TABLET | Freq: Every day | ORAL | 2 refills | Status: DC

## 2023-04-09 MED ORDER — LOKELMA 10 G PO PACK: 10.0000 g | PACK | ORAL | Status: DC

## 2023-04-09 MED ORDER — FUROSEMIDE 20 MG PO TABS: 20.0000 mg | ORAL_TABLET | Freq: Every day | ORAL | 1 refills | Status: DC

## 2023-04-09 MED ORDER — PRAVASTATIN SODIUM 10 MG PO TABS: 10.0000 mg | ORAL_TABLET | Freq: Every evening | ORAL | 1 refills | Status: DC

## 2023-04-09 MED ORDER — FUROSEMIDE 20 MG PO TABS
20.0000 mg | ORAL_TABLET | Freq: Every day | ORAL | Status: DC
  Administered 2023-04-09: 20 mg via ORAL
  Filled 2023-04-09: qty 1

## 2023-04-09 NOTE — Discharge Instructions (Signed)
IMPORTANT INFORMATION: PAY CLOSE ATTENTION   PHYSICIAN DISCHARGE INSTRUCTIONS  Follow with Primary care provider  Hall, John Z, MD  and other consultants as instructed by your Hospitalist Physician  SEEK MEDICAL CARE OR RETURN TO EMERGENCY ROOM IF SYMPTOMS COME BACK, WORSEN OR NEW PROBLEM DEVELOPS   Please note: You were cared for by a hospitalist during your hospital stay. Every effort will be made to forward records to your primary care provider.  You can request that your primary care provider send for your hospital records if they have not received them.  Once you are discharged, your primary care physician will handle any further medical issues. Please note that NO REFILLS for any discharge medications will be authorized once you are discharged, as it is imperative that you return to your primary care physician (or establish a relationship with a primary care physician if you do not have one) for your post hospital discharge needs so that they can reassess your need for medications and monitor your lab values.  Please get a complete blood count and chemistry panel checked by your Primary MD at your next visit, and again as instructed by your Primary MD.  Get Medicines reviewed and adjusted: Please take all your medications with you for your next visit with your Primary MD  Laboratory/radiological data: Please request your Primary MD to go over all hospital tests and procedure/radiological results at the follow up, please ask your primary care provider to get all Hospital records sent to his/her office.  In some cases, they will be blood work, cultures and biopsy results pending at the time of your discharge. Please request that your primary care provider follow up on these results.  If you are diabetic, please bring your blood sugar readings with you to your follow up appointment with primary care.    Please call and make your follow up appointments as soon as possible.    Also Note the  following: If you experience worsening of your admission symptoms, develop shortness of breath, life threatening emergency, suicidal or homicidal thoughts you must seek medical attention immediately by calling 911 or calling your MD immediately  if symptoms less severe.  You must read complete instructions/literature along with all the possible adverse reactions/side effects for all the Medicines you take and that have been prescribed to you. Take any new Medicines after you have completely understood and accpet all the possible adverse reactions/side effects.   Do not drive when taking Pain medications or sleeping medications (Benzodiazepines)  Do not take more than prescribed Pain, Sleep and Anxiety Medications. It is not advisable to combine anxiety,sleep and pain medications without talking with your primary care practitioner  Special Instructions: If you have smoked or chewed Tobacco  in the last 2 yrs please stop smoking, stop any regular Alcohol  and or any Recreational drug use.  Wear Seat belts while driving.  Do not drive if taking any narcotic, mind altering or controlled substances or recreational drugs or alcohol.       

## 2023-04-09 NOTE — Discharge Summary (Signed)
Physician Discharge Summary  Jaimya Wanko ZOX:[id number] DOB: July 28, 1934 DOA: 04/06/2023  PCP: [name], MD Cardiologist: Dr. Jenene Slicker Hematologist: Dr. [name]   Admit date: 04/06/2023 Discharge date: 04/09/2023  Admitted From:  Home  Disposition: Home (Family declined Home Health Service and Outpatient palliative care)  Recommendations for Outpatient Follow-up:  Follow up with PCP in 1 weeks Please obtain BMP/CBC in one week Follow up with cardiology on 05/07/23 as scheduled Follow up with hematologist on 05/04/23 as scheduled Ongoing outpatient discussion regarding recommendation for outpatient palliative care   Home Health: Declined  Discharge Condition: STABLE   CODE STATUS: FULL DIET: Heart Healthy   Brief Hospitalization Summary: Please see all hospital notes, images, labs for full details of the hospitalization. ADMISSION PROVIDER HPI:  89 year old female with a history of myelodysplastic syndrome, COPD, hypertension, chronic respiratory failure on 2 L, paroxysmal atrial fibrillation on aspirin, lung adenocarcinoma status post LUL lobectomy, breast cancer, and HFpEF presenting with 3-day history of generalized weakness, shortness of breath, and decreased oral intake.  The patient's daughter at the bedside also relates that she has been a little bit confused.  The patient states that she has had more more difficulty getting out of bed and getting back into bed secondary to generalized weakness.  She has had some subjective fevers and chills.  She states that she has had more dyspnea on exertion.  She has also been complaining of intermittent chest discomfort with exertion over the past month.  She denies any headache, neck pain, nausea, vomiting, diarrhea.  She has had some loose stools without any hematochezia.  She has had some dark stools.  She denies any hematemesis.  She denies any dysuria or hematuria.  Daughter states that the patient has also had decreased oral intake.   Notably, the patient recently saw Dr. [name] in the office on 04/01/2023.  There are plans for bone marrow biopsy to be done.  Because of the above symptoms, she presented for further evaluation and treatment.  In the ED, the patient was afebrile hemodynamically stable with oxygen saturation 95% on 2 L.  WBC 2.9, hemoglobin 10.0, platelets 66,000.  Sodium 128, potassium 5.4, bicarbonate 20, serum creatinine 0.93.  Chest x-ray showed chronic interstitial markings with hyperinflation.  Troponin 319.  EKG showed sinus rhythm with nonspecific STT changes.  Hospital Course by problem   NSTEMI  - being medically managed by cardiology - started imdur 30 mg daily per cardiology with good results - no chest pain symptoms at this time - per cardiology not a candidate for invasive or aggressive procedures/treatments - Hemoglobin goal >8 - transfused 1 unit PRBC on 6/6 and Hg up to 9.  - pravastatin 10 mg every evening - follow up lipid panel outpatient    Mild COPD exacerbation - RESOLVED - continue home bronchodilators  - weaned off steroids    ABLA with melena (guaiac positive) - appreciate GI team consultation - s/p 1 unit PRBC transfusion on 6/4 and 1 unit PRBC on 6/6 - Hg up to 9.4 on 04/09/23 - continue PPI twice daily per GI - GI signed off inpatient care     Chronic HFpEF -IV furosemide 20 mg daily started 04/07/23 by cardiology service  -11/04/22 Echo EF 65-70%, no WMA, G2DD -cardiology team recommending DC home on lasix 20 mg daily    Chronic respiratory failure with hypoxia -Chronically on 2 L at home   Thrombocytopenia/leukopenia -This has been chronic likely secondary to the patient's myelodysplastic syndrome -There are plans  for bone marrow biopsy per Dr. [name] -outpatient follow up with Dr. Kirtland Bouchard on 05/04/23 scheduled    Paroxysmal Atrial fib -currently in sinus -Continue metoprolol succinate -not a candidate for anticoagulation due to MDS and chronic anemia    Hyperkalemia -Lokelma ordered -Treated with temporizing measures - Resolved now - resume home lokelma every other day - recheck BMP in 1 week with PCP recommended   Hyponatremia - RESOLVED  -Secondary to volume depletion and poor solute intake -Sodium 136 on day of discharge    Discharge Diagnoses:  Principal Problem:   Pancytopenia (HCC) Active Problems:   Chronic respiratory failure with hypoxia (HCC)   COPD with acute exacerbation (HCC)   Paroxysmal atrial fibrillation (HCC)   Myelodysplastic syndrome (HCC)   Nonrheumatic aortic valve stenosis   NSTEMI (non-ST elevated myocardial infarction) [facility])   Discharge Instructions:  Allergies as of 04/09/2023       Reactions   Meloxicam Other (See Comments)   Caused an injury to the kidneys, per nephrologist   Micardis Hct [telmisartan-hctz] Other (See Comments)   Caused an injury to the kidneys, per nephrologist        Medication List     STOP taking these medications    aspirin EC 81 MG tablet       TAKE these medications    acetaminophen 325 MG tablet Commonly known as: TYLENOL Take 2 tablets (650 mg total) by mouth every 6 (six) hours.   albuterol 108 (90 Base) MCG/ACT inhaler Commonly known as: VENTOLIN HFA Inhale 1-2 puffs into the lungs every 4 (four) hours as needed for wheezing or shortness of breath.   furosemide 20 MG tablet Commonly known as: LASIX Take 1 tablet (20 mg total) by mouth daily. What changed:  medication strength how much to take   gabapentin 100 MG capsule Commonly known as: Neurontin Take 1 capsule (100 mg total) by mouth every 12 (twelve) hours as needed (neuropathy).   isosorbide mononitrate 30 MG 24 hr tablet Commonly known as: IMDUR Take 1 tablet (30 mg total) by mouth daily. Start taking on: April 10, 2023   levothyroxine 100 MCG tablet Commonly known as: SYNTHROID Take 1 tablet (100 mcg total) by mouth daily before breakfast.   Lokelma 10 g Pack packet Generic  drug: sodium zirconium cyclosilicate Take 10 g by mouth every other day. Start taking on: April 12, 2023 What changed:  when to take this These instructions start on April 12, 2023. If you are unsure what to do until then, ask your doctor or other care provider.   magnesium oxide 400 MG tablet Commonly known as: MAG-OX Take 400 mg by mouth 2 (two) times daily.   metoprolol succinate 25 MG 24 hr tablet Commonly known as: TOPROL-XL TAKE 1 TABLET DAILY   mirtazapine 7.5 MG tablet Commonly known as: REMERON Take 1 tablet (7.5 mg total) by mouth at bedtime.   Multi-Vitamin tablet Take 1 tablet by mouth daily.   pantoprazole 40 MG tablet Commonly known as: PROTONIX Take 1 tablet (40 mg total) by mouth 2 (two) times daily. What changed: when to take this   pravastatin 10 MG tablet Commonly known as: PRAVACHOL Take 1 tablet (10 mg total) by mouth every evening.   Trelegy Ellipta 100-62.5-25 MCG/ACT Aepb Generic drug: Fluticasone-Umeclidin-Vilant Inhale 1 puff into the lungs daily.        Follow-up Information     [name], MD Follow up in 1 week(s).   Specialty: Internal Medicine Why: [facility]  Follow Up Contact information: [address] [place] [facility] [address] [phone]         Marjo Bicker, MD. Nyra Capes on 05/07/2023.   Specialties: Cardiology, Internal Medicine Why: Hospital Follow Up as scheduled Contact information: [address] [place] [address] [phone]         Doreatha Massed, MD. Go on 05/04/2023.   Specialty: Hematology Why: as scheduled 2:30 pm Contact information: [address] [place] [address] [phone]                Allergies  Allergen Reactions   Meloxicam Other (See Comments)    Caused an injury to the kidneys, per nephrologist   Micardis Hct [Telmisartan-Hctz] Other (See Comments)    Caused an injury to the kidneys, per nephrologist   Allergies as of 04/09/2023       Reactions   Meloxicam Other  (See Comments)   Caused an injury to the kidneys, per nephrologist   Micardis Hct [telmisartan-hctz] Other (See Comments)   Caused an injury to the kidneys, per nephrologist        Medication List     STOP taking these medications    aspirin EC 81 MG tablet       TAKE these medications    acetaminophen 325 MG tablet Commonly known as: TYLENOL Take 2 tablets (650 mg total) by mouth every 6 (six) hours.   albuterol 108 (90 Base) MCG/ACT inhaler Commonly known as: VENTOLIN HFA Inhale 1-2 puffs into the lungs every 4 (four) hours as needed for wheezing or shortness of breath.   furosemide 20 MG tablet Commonly known as: LASIX Take 1 tablet (20 mg total) by mouth daily. What changed:  medication strength how much to take   gabapentin 100 MG capsule Commonly known as: Neurontin Take 1 capsule (100 mg total) by mouth every 12 (twelve) hours as needed (neuropathy).   isosorbide mononitrate 30 MG 24 hr tablet Commonly known as: IMDUR Take 1 tablet (30 mg total) by mouth daily. Start taking on: April 10, 2023   levothyroxine 100 MCG tablet Commonly known as: SYNTHROID Take 1 tablet (100 mcg total) by mouth daily before breakfast.   Lokelma 10 g Pack packet Generic drug: sodium zirconium cyclosilicate Take 10 g by mouth every other day. Start taking on: April 12, 2023 What changed:  when to take this These instructions start on April 12, 2023. If you are unsure what to do until then, ask your doctor or other care provider.   magnesium oxide 400 MG tablet Commonly known as: MAG-OX Take 400 mg by mouth 2 (two) times daily.   metoprolol succinate 25 MG 24 hr tablet Commonly known as: TOPROL-XL TAKE 1 TABLET DAILY   mirtazapine 7.5 MG tablet Commonly known as: REMERON Take 1 tablet (7.5 mg total) by mouth at bedtime.   Multi-Vitamin tablet Take 1 tablet by mouth daily.   pantoprazole 40 MG tablet Commonly known as: PROTONIX Take 1 tablet (40 mg total) by mouth 2  (two) times daily. What changed: when to take this   pravastatin 10 MG tablet Commonly known as: PRAVACHOL Take 1 tablet (10 mg total) by mouth every evening.   Trelegy Ellipta 100-62.5-25 MCG/ACT Aepb Generic drug: Fluticasone-Umeclidin-Vilant Inhale 1 puff into the lungs daily.        Procedures/Studies: ECHOCARDIOGRAM COMPLETE  Result Date: 04/07/2023    ECHOCARDIOGRAM REPORT   Patient Name:   Yariana Edmonds Date of Exam: 04/07/2023 Medical Rec #:  [id number]      Height:       64.0 in Accession #:    [id number]     Weight:       148.0 lb Date of Birth:  February 16, 1934      BSA:          1.721 m Patient Age:    89 years       BP:           173/83 mmHg Patient Gender: F              HR:           71 bpm. [facility] Procedure: 2D Echo, Cardiac Doppler and Color Doppler Indications:    Elevated Troponin  History:        Patient has prior history of Echocardiogram examinations, most                 recent 11/04/2022. NSTEMI, COPD, Aortic Valve Disease,                 Arrythmias:Atrial Fibrillation; Risk Factors:Former Smoker.  Sonographer:    Aron Baba Referring Phys: [phone] DAVID TAT IMPRESSIONS  1. Left ventricular ejection fraction, by estimation, is 60 to 65%. The left ventricle has normal function. The left ventricle has no regional wall motion abnormalities. There is mild left ventricular hypertrophy. Left ventricular diastolic parameters are consistent with Grade II diastolic dysfunction (pseudonormalization).  2. Right ventricular systolic function is mildly reduced. The right ventricular size is mildly enlarged. There is severely elevated pulmonary artery systolic pressure. The estimated right ventricular systolic pressure is 90.3 mmHg.  3. Left atrial size was severely dilated.  4. Right atrial size was severely dilated.  5. The mitral valve is normal in structure. Mild mitral valve regurgitation. No evidence of mitral stenosis.  6. The aortic valve is calcified. There is severe  calcifcation of the aortic valve. Aortic valve regurgitation is not visualized. Moderate to severe aortic valve stenosis. Aortic valve mean gradient measures 31.0 mmHg. Aortic valve Vmax measures 3.54 m/s. DVI is 0.26.  7. The inferior vena cava is dilated in size with <50% respiratory variability, suggesting right atrial pressure of 15 mmHg. Comparison(s): Changes from prior study are noted. Moderate to severe aortic valve stenosis, Mean AV gradient increased from 22 mm Hg in 11/2022 to 31 mm Hg now. FINDINGS  Left Ventricle: Left ventricular ejection fraction, by estimation, is 60 to 65%. The left ventricle has normal function. The left ventricle has no regional wall motion abnormalities. The left ventricular internal cavity size was normal in size. There is  mild left ventricular hypertrophy. Left ventricular diastolic parameters are consistent with Grade II diastolic dysfunction (pseudonormalization). Right Ventricle: The right ventricular size is mildly enlarged. No increase in right ventricular wall thickness. Right ventricular systolic function is mildly reduced. There is severely elevated pulmonary artery systolic pressure. The tricuspid regurgitant velocity is 4.34 m/s, and with an assumed right atrial pressure of 15 mmHg, the estimated right ventricular systolic pressure is 90.3 mmHg. Left Atrium: Left atrial size was severely dilated. Right Atrium: Right atrial size was severely dilated. Pericardium: There is no evidence of pericardial effusion. Mitral Valve: The mitral valve is normal in structure. Mild mitral valve regurgitation. No evidence of mitral valve stenosis. MV peak gradient, 9.0 mmHg. The mean mitral valve gradient is 3.0 mmHg. Tricuspid Valve: The tricuspid valve is normal in structure. Tricuspid valve regurgitation is mild . No evidence of tricuspid stenosis.  Aortic Valve: The aortic valve is calcified. There is severe calcifcation of the aortic valve. Aortic valve regurgitation is not  visualized. Moderate to severe aortic stenosis is present. Aortic valve mean gradient measures 31.0 mmHg. Aortic valve peak gradient measures 50.1 mmHg. Aortic valve area, by VTI measures 0.61 cm. Pulmonic Valve: The pulmonic valve was grossly normal. Pulmonic valve regurgitation is mild. No evidence of pulmonic stenosis. Aorta: The aortic root and ascending aorta are structurally normal, with no evidence of dilitation. Venous: The inferior vena cava is dilated in size with less than 50% respiratory variability, suggesting right atrial pressure of 15 mmHg. IAS/Shunts: There is right bowing of the interatrial septum, suggestive of elevated left atrial pressure. No atrial level shunt detected by color flow Doppler.  LEFT VENTRICLE PLAX 2D LVIDd:         4.10 cm   Diastology LVIDs:         2.80 cm   LV e' medial:    6.81 cm/s LV PW:         1.20 cm   LV E/e' medial:  21.3 LV IVS:        1.20 cm   LV e' lateral:   10.90 cm/s LVOT diam:     1.70 cm   LV E/e' lateral: 13.3 LV SV:         47 LV SV Index:   27 LVOT Area:     2.27 cm  LEFT ATRIUM              Index        RIGHT ATRIUM           Index LA diam:        4.30 cm  2.50 cm/m   RA Area:     22.90 cm LA Vol (A2C):   104.0 ml 60.42 ml/m  RA Volume:   62.30 ml  36.19 ml/m LA Vol (A4C):   101.0 ml 58.67 ml/m LA Biplane Vol: 109.0 ml 63.32 ml/m  AORTIC VALVE                     PULMONIC VALVE AV Area (Vmax):    0.59 cm      PR End Diast Vel: 6.81 msec AV Area (Vmean):   0.57 cm AV Area (VTI):     0.61 cm AV Vmax:           354.00 cm/s AV Vmean:          238.667 cm/s AV VTI:            0.767 m AV Peak Grad:      50.1 mmHg AV Mean Grad:      31.0 mmHg LVOT Vmax:         91.30 cm/s LVOT Vmean:        60.400 cm/s LVOT VTI:          0.205 m LVOT/AV VTI ratio: 0.27  AORTA Ao Root diam: 3.10 cm Ao Asc diam:  2.90 cm MITRAL VALVE                TRICUSPID VALVE MV Area (PHT): 5.13 cm     TR Peak grad:   75.3 mmHg MV Area VTI:   1.27 cm     TR Vmax:        434.00 cm/s  MV Peak grad:  9.0 mmHg MV Mean grad:  3.0 mmHg     SHUNTS MV Vmax:  1.50 m/s     Systemic VTI:  0.20 m MV Vmean:      76.9 cm/s    Systemic Diam: 1.70 cm MV Decel Time: 148 msec MR Peak grad: 150.1 mmHg MR Mean grad: 97.0 mmHg MR Vmax:      612.50 cm/s MR Vmean:     464.0 cm/s MV E velocity: 145.00 cm/s MV A velocity: 80.80 cm/s MV E/A ratio:  1.79 Vishnu Priya Mallipeddi Electronically signed by Winfield Rast Mallipeddi Signature Date/Time: 04/07/2023/5:18:59 PM    Final    DG Chest Port 1 View  Result Date: 04/06/2023 CLINICAL DATA:  Shortness of breath EXAM: PORTABLE CHEST 1 VIEW COMPARISON:  11/03/2022 FINDINGS: Mild cardiomegaly. Aortic atherosclerosis. There is hyperinflation of the lungs compatible with COPD. Mild interstitial prominence is similar to prior study, likely chronic lung disease. Bibasilar linear densities compatible with atelectasis or scarring. No effusions. IMPRESSION: COPD/chronic changes. Cardiomegaly. Bibasilar atelectasis or scarring. Electronically Signed   By: Charlett Nose M.D.   On: 04/06/2023 12:40     Subjective: Pt reporting that she is feeling great today and she felt like the blood transfusion made a huge difference. She says that she would really like to go home.   Discharge Exam: Vitals:   04/09/23 0814 04/09/23 0826  BP:    Pulse:    Resp:    Temp:    SpO2: 97% 100%   Vitals:   04/09/23 0423 04/09/23 0811 04/09/23 0814 04/09/23 0826  BP: 133/73     Pulse:      Resp:      Temp:      TempSrc:      SpO2:  93% 97% 100%  Weight:      Height:       General exam: frail, elderly female, awake, alert, cooperative. Appears calm and comfortable  Respiratory system: Clear to auscultation. Respiratory effort normal. Cardiovascular system: normal S1 & S2 heard. No JVD, murmurs, rubs, gallops or clicks. No pedal edema. Gastrointestinal system: Abdomen is nondistended, soft and nontender. No organomegaly or masses felt. Normal bowel sounds heard. Central  nervous system: Alert and oriented. Extremities: Symmetric 5 x 5 power. Skin: No rashes, lesions or ulcers. Psychiatry: Judgement and insight appear normal. Mood & affect appropriate.    The results of significant diagnostics from this hospitalization (including imaging, microbiology, ancillary and laboratory) are listed below for reference.     Microbiology: Recent Results (from the past 240 hour(s))  Culture, blood (Routine X 2) w Reflex to ID Panel     Status: None (Preliminary result)   Collection Time: 04/06/23  3:33 PM   Specimen: BLOOD LEFT ARM  Result Value Ref Range Status   Specimen Description BLOOD LEFT ARM  Final   Special Requests   Final    BOTTLES DRAWN AEROBIC AND ANAEROBIC Blood Culture adequate volume   Culture   Final    NO GROWTH 3 DAYS Performed at [facility], [address], [place], [place] [address]    Report Status PENDING  Incomplete  Culture, blood (Routine X 2) w Reflex to ID Panel     Status: None (Preliminary result)   Collection Time: 04/06/23  3:44 PM   Specimen: BLOOD RIGHT ARM  Result Value Ref Range Status   Specimen Description BLOOD RIGHT ARM  Final   Special Requests   Final    BOTTLES DRAWN AEROBIC AND ANAEROBIC Blood Culture adequate volume   Culture   Final    NO GROWTH 3 DAYS  Performed at [facility], [address] [place], [place] [address]    Report Status PENDING  Incomplete  Urine Culture (for pregnant, neutropenic or urologic patients or patients with an indwelling urinary catheter)     Status: Abnormal   Collection Time: 04/06/23  4:50 PM   Specimen: Urine, Clean Catch  Result Value Ref Range Status   Specimen Description   Final    URINE, CLEAN CATCH Performed at [facility], [address], [place], [place] [address]    Special Requests   Final    NONE Performed at [facility], [address], [place], [place] [address]    Culture MULTIPLE SPECIES PRESENT, SUGGEST RECOLLECTION (A)  Final   Report Status 04/07/2023  FINAL  Final  Respiratory (~20 pathogens) panel by PCR     Status: None   Collection Time: 04/06/23 10:50 PM   Specimen: Nasopharyngeal Swab; Respiratory  Result Value Ref Range Status   Adenovirus NOT DETECTED NOT DETECTED Final   Coronavirus 229E NOT DETECTED NOT DETECTED Final    Comment: (NOTE) The Coronavirus on the Respiratory Panel, DOES NOT test for the novel  Coronavirus (2019 nCoV)    Coronavirus HKU1 NOT DETECTED NOT DETECTED Final   Coronavirus NL63 NOT DETECTED NOT DETECTED Final   Coronavirus OC43 NOT DETECTED NOT DETECTED Final   Metapneumovirus NOT DETECTED NOT DETECTED Final   Rhinovirus / Enterovirus NOT DETECTED NOT DETECTED Final   Influenza A NOT DETECTED NOT DETECTED Final   Influenza B NOT DETECTED NOT DETECTED Final   Parainfluenza Virus 1 NOT DETECTED NOT DETECTED Final   Parainfluenza Virus 2 NOT DETECTED NOT DETECTED Final   Parainfluenza Virus 3 NOT DETECTED NOT DETECTED Final   Parainfluenza Virus 4 NOT DETECTED NOT DETECTED Final   Respiratory Syncytial Virus NOT DETECTED NOT DETECTED Final   Bordetella pertussis NOT DETECTED NOT DETECTED Final   Bordetella Parapertussis NOT DETECTED NOT DETECTED Final   Chlamydophila pneumoniae NOT DETECTED NOT DETECTED Final   Mycoplasma pneumoniae NOT DETECTED NOT DETECTED Final    Comment: Performed at [facility], [address], [place], [place] [address]     Labs: BNP (last 3 results) Recent Labs    11/03/22 0744  BNP 743.0*   Basic Metabolic Panel: Recent Labs  Lab 04/06/23 1205 04/08/23 0415 04/09/23 0426  NA 128* 132* 136  K 5.4* 4.8 3.5  CL 93* 94* 94*  CO2 28 28 32  GLUCOSE 102* 136* 85  BUN 22 25* 28*  CREATININE 0.93 1.15* 1.09*  CALCIUM 8.7* 9.0 9.6  MG  --  1.8 2.1   Liver Function Tests: Recent Labs  Lab 04/06/23 1205  AST 32  ALT 15  ALKPHOS 36*  BILITOT 0.8  PROT 8.8*  ALBUMIN 3.2*   No results for input(s): "LIPASE", "AMYLASE" in the last 168 hours. Recent Labs   Lab 04/06/23 1533  AMMONIA 18   CBC: Recent Labs  Lab 04/06/23 1205 04/07/23 0430 04/08/23 0415 04/08/23 1519 04/09/23 0426  WBC 2.9* 1.5* 1.9*  --  5.3  NEUTROABS 0.3*  --   --   --   --   HGB 7.0* 7.7* 7.8* 8.9* 9.4*  HCT 23.9* 25.0* 25.4* 29.0* 30.9*  MCV 98.8 94.7 95.5  --  93.4  PLT 66* 64* 77*  --  96*   Cardiac Enzymes: No results for input(s): "CKTOTAL", "CKMB", "CKMBINDEX", "TROPONINI" in the last 168 hours. BNP: Invalid input(s): "POCBNP" CBG: No results for input(s): "GLUCAP" in  the last 168 hours. D-Dimer No results for input(s): "DDIMER" in the last 72 hours. Hgb A1c No results for input(s): "HGBA1C" in the last 72 hours. Lipid Profile No results for input(s): "CHOL", "HDL", "LDLCALC", "TRIG", "CHOLHDL", "LDLDIRECT" in the last 72 hours. Thyroid function studies Recent Labs    04/06/23 1533  TSH 0.735   Anemia work up Recent Labs    04/06/23 1533  VITAMINB12 766  FOLATE 36.5   Urinalysis    Component Value Date/Time   COLORURINE YELLOW 04/06/2023 1650   APPEARANCEUR CLEAR 04/06/2023 1650   LABSPEC 1.006 04/06/2023 1650   PHURINE 6.0 04/06/2023 1650   GLUCOSEU NEGATIVE 04/06/2023 1650   HGBUR NEGATIVE 04/06/2023 1650   BILIRUBINUR NEGATIVE 04/06/2023 1650   KETONESUR NEGATIVE 04/06/2023 1650   PROTEINUR NEGATIVE 04/06/2023 1650   NITRITE NEGATIVE 04/06/2023 1650   LEUKOCYTESUR NEGATIVE 04/06/2023 1650   Sepsis Labs Recent Labs  Lab 04/06/23 1205 04/07/23 0430 04/08/23 0415 04/09/23 0426  WBC 2.9* 1.5* 1.9* 5.3   Microbiology Recent Results (from the past 240 hour(s))  Culture, blood (Routine X 2) w Reflex to ID Panel     Status: None (Preliminary result)   Collection Time: 04/06/23  3:33 PM   Specimen: BLOOD LEFT ARM  Result Value Ref Range Status   Specimen Description BLOOD LEFT ARM  Final   Special Requests   Final    BOTTLES DRAWN AEROBIC AND ANAEROBIC Blood Culture adequate volume   Culture   Final    NO GROWTH 3  DAYS Performed at [facility], [address] [place], [place] [address]    Report Status PENDING  Incomplete  Culture, blood (Routine X 2) w Reflex to ID Panel     Status: None (Preliminary result)   Collection Time: 04/06/23  3:44 PM   Specimen: BLOOD RIGHT ARM  Result Value Ref Range Status   Specimen Description BLOOD RIGHT ARM  Final   Special Requests   Final    BOTTLES DRAWN AEROBIC AND ANAEROBIC Blood Culture adequate volume   Culture   Final    NO GROWTH 3 DAYS Performed at [facility], [address], [place], [place] [address]    Report Status PENDING  Incomplete  Urine Culture (for pregnant, neutropenic or urologic patients or patients with an indwelling urinary catheter)     Status: Abnormal   Collection Time: 04/06/23  4:50 PM   Specimen: Urine, Clean Catch  Result Value Ref Range Status   Specimen Description   Final    URINE, CLEAN CATCH Performed at [facility], [address] [place], [place] [address]    Special Requests   Final    NONE Performed at [facility], [address], [place], [place] [address]    Culture MULTIPLE SPECIES PRESENT, SUGGEST RECOLLECTION (A)  Final   Report Status 04/07/2023 FINAL  Final  Respiratory (~20 pathogens) panel by PCR     Status: None   Collection Time: 04/06/23 10:50 PM   Specimen: Nasopharyngeal Swab; Respiratory  Result Value Ref Range Status   Adenovirus NOT DETECTED NOT DETECTED Final   Coronavirus 229E NOT DETECTED NOT DETECTED Final    Comment: (NOTE) The Coronavirus on the Respiratory Panel, DOES NOT test for the novel  Coronavirus (2019 nCoV)    Coronavirus HKU1 NOT DETECTED NOT DETECTED Final   Coronavirus NL63 NOT DETECTED NOT DETECTED Final   Coronavirus OC43 NOT DETECTED NOT DETECTED Final   Metapneumovirus NOT DETECTED NOT DETECTED Final   Rhinovirus /  Enterovirus NOT DETECTED NOT DETECTED Final   Influenza A NOT DETECTED NOT DETECTED Final   Influenza B NOT DETECTED NOT DETECTED Final   Parainfluenza  Virus 1 NOT DETECTED NOT DETECTED Final   Parainfluenza Virus 2 NOT DETECTED NOT DETECTED Final   Parainfluenza Virus 3 NOT DETECTED NOT DETECTED Final   Parainfluenza Virus 4 NOT DETECTED NOT DETECTED Final   Respiratory Syncytial Virus NOT DETECTED NOT DETECTED Final   Bordetella pertussis NOT DETECTED NOT DETECTED Final   Bordetella Parapertussis NOT DETECTED NOT DETECTED Final   Chlamydophila pneumoniae NOT DETECTED NOT DETECTED Final   Mycoplasma pneumoniae NOT DETECTED NOT DETECTED Final    Comment: Performed at [facility], [address], [place], [place] [address]    Time coordinating discharge:  38 mins   SIGNED:  Standley Dakins, MD  [facility] 04/09/2023, 11:24 AM How to contact the [facility] Attending or Consulting provider 7A - 7P or covering provider during after hours 7P -7A, for this patient?  Check the care team in [facility] and look for a) attending/consulting TRH provider listed and b) the [facility] team listed Log into [email or web address] and use [place]'s universal password to access. If you do not have the password, please contact the hospital operator. Locate the [facility] provider you are looking for under [facility] and page to a number that you can be directly reached. If you still have difficulty reaching the provider, please page the [facility] (Director on Call) for the Hospitalists listed on amion for assistance.

## 2023-04-09 NOTE — TOC Transition Note (Signed)
Transition of Care [facility]) - CM/SW Discharge Note   Patient Details  Name: Lisa [name] MRN: [id number] Date of Birth: [birth year]  Transition of Care [facility]) CM/SW Contact:  Villa Herb, LCSWA Phone Number: 04/09/2023, 10:30 AM  Clinical Narrative:    CSW followed up with pts daughter Steward Drone to inquire if they have changed their mind about HH being set up. At this time Steward Drone states it is not needed. CSW also inquired at request of MD that outpatient palliative be set up. CSW spoke with Steward Drone about this, at this time they are not interested in this referral. CSW explained that if she changes her mind to reach out with pts PCP. TOC signing off.   Final next level of care: Home/Self Care Barriers to Discharge: Barriers Resolved   Patient Goals and CMS Choice CMS Medicare.gov Compare Post Acute Care list provided to:: Patient Represenative (must comment) Choice offered to / list presented to : Adult Children  Discharge Placement                         Discharge Plan and Services Additional resources added to the After Visit Summary for   In-house Referral: Clinical Social Work                                   Social Determinants of Health (SDOH) Interventions SDOH Screenings   Food Insecurity: No Food Insecurity (01/15/2023)  Housing: Low Risk  (01/15/2023)  Transportation Needs: No Transportation Needs (01/15/2023)  Utilities: Not At Risk (01/15/2023)  Depression (PHQ2-9): Low Risk  (12/12/2020)  Physical Activity: Inactive (12/12/2020)  Tobacco Use: Medium Risk (04/06/2023)     Readmission Risk Interventions    08/28/2021    1:06 PM  Readmission Risk Prevention Plan  Transportation Screening Complete  HRI or Home Care Consult Complete  Social Work Consult for Recovery Care Planning/Counseling Complete  Palliative Care Screening Not Applicable  Medication Review Oceanographer) Complete

## 2023-04-09 NOTE — Progress Notes (Signed)
Pt hypertensive, PRN Hydralazine given and BP decreased. No c/o pain noted.

## 2023-04-09 NOTE — Consult Note (Signed)
[facility] Customer service manager [facility]) Accountable Care Organization (ACO) [name] Note  04/09/2023  Jalila Goodnough [birth year] [id number]  Location: [facility] RN Hospital Liaison screened the patient remotely at [facility].  Insurance: MCR ACO   Lisa [name] is a 89 y.o. female who is a Optician, dispensing Care Patient of Margo Aye, Kathleene Hazel, MD. The patient was screened for readmission hospitalization with noted high risk score for unplanned readmission risk with 2 IP in 6 months.  The patient was assessed for potential [facility]) Care Management service needs for post hospital transition for care coordination. Review of patient's electronic medical record reveals patient was admitted for NSTEMI. Spoke with daughter Steward Drone concerning [facility] services and available programs. Daughter receptive to a referral post discharge [facility] care coordinator. Pt/Daughter have declined [facility] and palliative services with this discharge.   Plan: [facility] Liaison will continue to follow progress and disposition to asess for post hospital community care coordination/management needs.  Referral request for community care coordination: anticipate [facility] Transitions of Care Team follow up.   [facility] Care Management/Population Health does not replace or interfere with any arrangements made by the Inpatient Transition of Care team.   For questions contact:   Elliot Cousin, RN, BSN [facility] Liaison [place]   [facility]  Population Health Office Hours MTWF  8:00 am-6:00 pm Off on Thursday [phone] mobile [phone] [Office toll free line][facility] Office Hours are M-F 8:30 - 5 pm 24 hour nurse advise line [phone] Concierge  [email or web address]

## 2023-04-09 NOTE — Progress Notes (Signed)
Progress Note  Patient Name: Lisa [name] Date of Encounter: 04/09/2023  Primary Cardiologist: Marjo Bicker, MD  Subjective   No acute events overnight.  No symptoms of SOB or angina in the last 24 hours.  Inpatient Medications    Scheduled Meds:  arformoterol  15 mcg Nebulization BID   budesonide (PULMICORT) nebulizer solution  0.5 mg Nebulization BID   furosemide  20 mg Intravenous BID   ipratropium-albuterol  3 mL Nebulization TID   isosorbide mononitrate  30 mg Oral Daily   levothyroxine  100 mcg Oral QAC breakfast   magnesium oxide  400 mg Oral BID   methylPREDNISolone (SOLU-MEDROL) injection  40 mg Intravenous Daily   metoprolol succinate  25 mg Oral Daily   mirtazapine  7.5 mg Oral QHS   multivitamin with minerals  1 tablet Oral Daily   pantoprazole (PROTONIX) IV  40 mg Intravenous Q12H   sodium zirconium cyclosilicate  10 g Oral Daily   Continuous Infusions:   PRN Meds: acetaminophen **OR** acetaminophen, gabapentin, hydrALAZINE, ipratropium-albuterol, ondansetron **OR** ondansetron (ZOFRAN) IV   Vital Signs    Vitals:   04/09/23 0423 04/09/23 0811 04/09/23 0814 04/09/23 0826  BP: 133/73     Pulse:      Resp:      Temp:      TempSrc:      SpO2:  93% 97% 100%  Weight:      Height:        Intake/Output Summary (Last 24 hours) at 04/09/2023 0906 Last data filed at 04/08/2023 1607 Gross per 24 hour  Intake 1076.67 ml  Output 1300 ml  Net -223.33 ml   Filed Weights   04/06/23 1142 04/06/23 1500  Weight: 69 kg 67.1 kg    Telemetry     Personally reviewed.  ECG    Not performed today  Physical Exam   GEN: No acute distress.   Neck: JVD at the base of the neck Cardiac: RRR, no murmur, rub, or gallop.  Respiratory: Nonlabored. Clear to auscultation bilaterally. GI: Soft, nontender, bowel sounds present. MS: No edema; No deformity. Neuro:  Nonfocal. Psych: Alert and oriented x 3. Normal affect.  Labs    Chemistry Recent Labs   Lab 04/06/23 1205 04/08/23 0415 04/09/23 0426  NA 128* 132* 136  K 5.4* 4.8 3.5  CL 93* 94* 94*  CO2 28 28 32  GLUCOSE 102* 136* 85  BUN 22 25* 28*  CREATININE 0.93 1.15* 1.09*  CALCIUM 8.7* 9.0 9.6  PROT 8.8*  --   --   ALBUMIN 3.2*  --   --   AST 32  --   --   ALT 15  --   --   ALKPHOS 36*  --   --   BILITOT 0.8  --   --   GFRNONAA 59* 46* 49*  ANIONGAP 7 10 10      Hematology Recent Labs  Lab 04/07/23 0430 04/08/23 0415 04/08/23 1519 04/09/23 0426  WBC 1.5* 1.9*  --  5.3  RBC 2.64* 2.66*  --  3.31*  HGB 7.7* 7.8* 8.9* 9.4*  HCT 25.0* 25.4* 29.0* 30.9*  MCV 94.7 95.5  --  93.4  MCH 29.2 29.3  --  28.4  MCHC 30.8 30.7  --  30.4  RDW 18.4* 18.0*  --  18.7*  PLT 64* 77*  --  96*    Cardiac Enzymes Recent Labs  Lab 04/06/23 1205 04/06/23 1351  TROPONINIHS 319* 325*    BNPNo results  for input(s): "BNP", "PROBNP" in the last 168 hours.   DDimerNo results for input(s): "DDIMER" in the last 168 hours.   Radiology    ECHOCARDIOGRAM COMPLETE  Result Date: 04/07/2023    ECHOCARDIOGRAM REPORT   Patient Name:   Lisa [name] Date of Exam: 04/07/2023 Medical Rec #:  [id number]      Height:       64.0 in Accession #:    [id number]     Weight:       148.0 lb Date of Birth:  04-10-1934      BSA:          1.721 m Patient Age:    89 years       BP:           173/83 mmHg Patient Gender: F              HR:           71 bpm. [facility]am [facility] Procedure: 2D Echo, Cardiac Doppler and Color Doppler Indications:    Elevated Troponin  History:        Patient has prior history of Echocardiogram examinations, most                 recent 11/04/2022. NSTEMI, COPD, Aortic Valve Disease,                 Arrythmias:Atrial Fibrillation; Risk Factors:Former Smoker.  Sonographer:    Aron Baba Referring Phys: [phone] DAVID TAT IMPRESSIONS  1. Left ventricular ejection fraction, by estimation, is 60 to 65%. The left ventricle has normal function. The left ventricle has no regional wall motion  abnormalities. There is mild left ventricular hypertrophy. Left ventricular diastolic parameters are consistent with Grade II diastolic dysfunction (pseudonormalization).  2. Right ventricular systolic function is mildly reduced. The right ventricular size is mildly enlarged. There is severely elevated pulmonary artery systolic pressure. The estimated right ventricular systolic pressure is 90.3 mmHg.  3. Left atrial size was severely dilated.  4. Right atrial size was severely dilated.  5. The mitral valve is normal in structure. Mild mitral valve regurgitation. No evidence of mitral stenosis.  6. The aortic valve is calcified. There is severe calcifcation of the aortic valve. Aortic valve regurgitation is not visualized. Moderate to severe aortic valve stenosis. Aortic valve mean gradient measures 31.0 mmHg. Aortic valve Vmax measures 3.54 m/s. DVI is 0.26.  7. The inferior vena cava is dilated in size with <50% respiratory variability, suggesting right atrial pressure of 15 mmHg. Comparison(s): Changes from prior study are noted. Moderate to severe aortic valve stenosis, Mean AV gradient increased from 22 mm Hg in 11/2022 to 31 mm Hg now. FINDINGS  Left Ventricle: Left ventricular ejection fraction, by estimation, is 60 to 65%. The left ventricle has normal function. The left ventricle has no regional wall motion abnormalities. The left ventricular internal cavity size was normal in size. There is  mild left ventricular hypertrophy. Left ventricular diastolic parameters are consistent with Grade II diastolic dysfunction (pseudonormalization). Right Ventricle: The right ventricular size is mildly enlarged. No increase in right ventricular wall thickness. Right ventricular systolic function is mildly reduced. There is severely elevated pulmonary artery systolic pressure. The tricuspid regurgitant velocity is 4.34 m/s, and with an assumed right atrial pressure of 15 mmHg, the estimated right ventricular systolic  pressure is 90.3 mmHg. Left Atrium: Left atrial size was severely dilated. Right Atrium: Right atrial size was severely dilated. Pericardium: There is no evidence  of pericardial effusion. Mitral Valve: The mitral valve is normal in structure. Mild mitral valve regurgitation. No evidence of mitral valve stenosis. MV peak gradient, 9.0 mmHg. The mean mitral valve gradient is 3.0 mmHg. Tricuspid Valve: The tricuspid valve is normal in structure. Tricuspid valve regurgitation is mild . No evidence of tricuspid stenosis. Aortic Valve: The aortic valve is calcified. There is severe calcifcation of the aortic valve. Aortic valve regurgitation is not visualized. Moderate to severe aortic stenosis is present. Aortic valve mean gradient measures 31.0 mmHg. Aortic valve peak gradient measures 50.1 mmHg. Aortic valve area, by VTI measures 0.61 cm. Pulmonic Valve: The pulmonic valve was grossly normal. Pulmonic valve regurgitation is mild. No evidence of pulmonic stenosis. Aorta: The aortic root and ascending aorta are structurally normal, with no evidence of dilitation. Venous: The inferior vena cava is dilated in size with less than 50% respiratory variability, suggesting right atrial pressure of 15 mmHg. IAS/Shunts: There is right bowing of the interatrial septum, suggestive of elevated left atrial pressure. No atrial level shunt detected by color flow Doppler.  LEFT VENTRICLE PLAX 2D LVIDd:         4.10 cm   Diastology LVIDs:         2.80 cm   LV e' medial:    6.81 cm/s LV PW:         1.20 cm   LV E/e' medial:  21.3 LV IVS:        1.20 cm   LV e' lateral:   10.90 cm/s LVOT diam:     1.70 cm   LV E/e' lateral: 13.3 LV SV:         47 LV SV Index:   27 LVOT Area:     2.27 cm  LEFT ATRIUM              Index        RIGHT ATRIUM           Index LA diam:        4.30 cm  2.50 cm/m   RA Area:     22.90 cm LA Vol (A2C):   104.0 ml 60.42 ml/m  RA Volume:   62.30 ml  36.19 ml/m LA Vol (A4C):   101.0 ml 58.67 ml/m LA Biplane Vol:  109.0 ml 63.32 ml/m  AORTIC VALVE                     PULMONIC VALVE AV Area (Vmax):    0.59 cm      PR End Diast Vel: 6.81 msec AV Area (Vmean):   0.57 cm AV Area (VTI):     0.61 cm AV Vmax:           354.00 cm/s AV Vmean:          238.667 cm/s AV VTI:            0.767 m AV Peak Grad:      50.1 mmHg AV Mean Grad:      31.0 mmHg LVOT Vmax:         91.30 cm/s LVOT Vmean:        60.400 cm/s LVOT VTI:          0.205 m LVOT/AV VTI ratio: 0.27  AORTA Ao Root diam: 3.10 cm Ao Asc diam:  2.90 cm MITRAL VALVE                TRICUSPID VALVE MV Area (PHT): 5.13 cm  TR Peak grad:   75.3 mmHg MV Area VTI:   1.27 cm     TR Vmax:        434.00 cm/s MV Peak grad:  9.0 mmHg MV Mean grad:  3.0 mmHg     SHUNTS MV Vmax:       1.50 m/s     Systemic VTI:  0.20 m MV Vmean:      76.9 cm/s    Systemic Diam: 1.70 cm MV Decel Time: 148 msec MR Peak grad: 150.1 mmHg MR Mean grad: 97.0 mmHg MR Vmax:      612.50 cm/s MR Vmean:     464.0 cm/s MV E velocity: 145.00 cm/s MV A velocity: 80.80 cm/s MV E/A ratio:  1.79 Karrisa Didio Priya Kwane Rohl Electronically signed by Winfield Rast Nicle Connole Signature Date/Time: 04/07/2023/5:18:59 PM    Final      Assessment & Plan   Patient is a 89 year old F known to have paroxysmal A-fib, moderate to severe aortic valve stenosis and moderate mitral valve regurgitation by echo in 1/24, chronic diastolic heart failure, myelodysplastic syndrome (anemia and thrombocytopenia) COPD on home oxygen 2L, breast cancer s/p lumpectomy and radiation, history of lung cancer s/p LUL lobectomy presented to ER with DOE and chest pain x 6 weeks.    # NSTEMI -Presented with DOE and chest pain x 6 weeks, EKG showed NSR with no ischemia (nonspecific old T wave inversions in inferior leads) and high sensitive troponins were elevated, 319>> 325.  Echocardiogram from 6/24 showed normal LVEF, grade 2 DD, moderate to severe aortic valve stenosis (mean AV gradient went up from 22 mmHg to 31 mmHg). Due to history of  myelo-dysplastic syndrome with chronic anemia and thrombocytopenia, patient is not a candidate for any invasive ischemia evaluation. Not a candidate for antiplatelet or anticoagulation either. Recommend symptomatic management with isosorbide mononitrate 30 mg once daily.  Keep hemoglobin more than 8.   # Acute on chronic diastolic heart failure exacerbation -Presenting with DOE x 6 weeks associated with chest pain.  Had adequate urine output, 1.3L in the last 24 hours with net -0.2 L. Significant improvement in her symptoms of DOE.  No angina.  Switch IV to p.o. Lasix 20 mg once daily.   # Moderate to severe aortic valve stenosis by echo in 5/24 (mean AV gradient increased from 22 mmHg in 1/24 to 31 mmHg in 5/24) # Mild to moderate mitral valve regurgitation by echo in 1/24 -Will repeat echocardiogram in 6 months. I spoke with the grandson, Lisa [name] (psychiatry resident at [facility]) on 04/08/2023 and updated the plan. Strongly recommended outpatient palliative care consultation after [place] is [place] the country.   # Fusiform infrarenal abdominal aortic aneurysm, 4.8 cm per 1/24 CT imaging -Not a candidate for surgical intervention due to chronic anemia/thrombocytopenia from MDS, multiple comorbidities and advanced age.  Can forego further CT imaging in the future.   # Myelodysplastic syndrome with chronic anemia and thrombocytopenia -Per primary team and oncology  [facility] will sign off.   Medication Recommendations: Isosorbide mononitrate 30 mg once daily, p.o. Lasix 20 mg once daily Other recommendations (labs, testing, etc): None Follow up as an outpatient:  Keep cardiology appointment on 05/07/2023 with me   I have spent a total of 30 minutes with patient reviewing chart , telemetry, EKGs, labs and examining patient as well as establishing an assessment and plan that was discussed with the patient.  > 50% of time was spent in direct patient care.  Signed, Marjo Bicker, MD   04/09/2023, 9:06 AM

## 2023-04-10 LAB — CULTURE, BLOOD (ROUTINE X 2): Special Requests: ADEQUATE

## 2023-04-12 ENCOUNTER — Telehealth: Payer: Self-pay | Admitting: *Deleted

## 2023-04-12 NOTE — Progress Notes (Signed)
  Care Coordination  Outreach Note  04/12/2023 Name: Lisa [name] MRN: [id number] DOB: [birth year]   Care Coordination Outreach Attempts: An unsuccessful telephone outreach was attempted today to offer the patient information about available care coordination services.  Follow Up Plan:  Additional outreach attempts will be made to offer the patient care coordination information and services.   Encounter Outcome:  No Answer  Christie Nottingham  Care Coordination Care Guide  Direct Dial: [phone]

## 2023-04-13 LAB — CULTURE, BLOOD (ROUTINE X 2)
Culture: NO GROWTH
Culture: NO GROWTH

## 2023-04-14 ENCOUNTER — Other Ambulatory Visit: Payer: Self-pay

## 2023-04-14 ENCOUNTER — Emergency Department (HOSPITAL_COMMUNITY)
Admission: EM | Admit: 2023-04-14 | Discharge: 2023-04-15 | Disposition: A | Discharge status: Home / Self Care | Payer: Medicare Other | Attending: Emergency Medicine | Admitting: Emergency Medicine

## 2023-04-14 ENCOUNTER — Encounter (HOSPITAL_COMMUNITY): Payer: Self-pay

## 2023-04-14 ENCOUNTER — Emergency Department (HOSPITAL_COMMUNITY): Payer: Medicare Other

## 2023-04-14 DIAGNOSIS — Z79899 Other long term (current) drug therapy: Secondary | ICD-10-CM | POA: Insufficient documentation

## 2023-04-14 DIAGNOSIS — S0993XA Unspecified injury of face, initial encounter: Secondary | ICD-10-CM | POA: Diagnosis not present

## 2023-04-14 DIAGNOSIS — S0990XA Unspecified injury of head, initial encounter: Secondary | ICD-10-CM | POA: Diagnosis not present

## 2023-04-14 DIAGNOSIS — R0902 Hypoxemia: Secondary | ICD-10-CM | POA: Diagnosis not present

## 2023-04-14 DIAGNOSIS — E039 Hypothyroidism, unspecified: Secondary | ICD-10-CM | POA: Diagnosis not present

## 2023-04-14 DIAGNOSIS — I10 Essential (primary) hypertension: Secondary | ICD-10-CM | POA: Diagnosis not present

## 2023-04-14 DIAGNOSIS — Y92009 Unspecified place in unspecified non-institutional (private) residence as the place of occurrence of the external cause: Secondary | ICD-10-CM | POA: Insufficient documentation

## 2023-04-14 DIAGNOSIS — W19XXXA Unspecified fall, initial encounter: Secondary | ICD-10-CM | POA: Diagnosis not present

## 2023-04-14 DIAGNOSIS — R531 Weakness: Secondary | ICD-10-CM | POA: Insufficient documentation

## 2023-04-14 DIAGNOSIS — E875 Hyperkalemia: Secondary | ICD-10-CM | POA: Diagnosis not present

## 2023-04-14 DIAGNOSIS — Z85118 Personal history of other malignant neoplasm of bronchus and lung: Secondary | ICD-10-CM | POA: Diagnosis not present

## 2023-04-14 DIAGNOSIS — S199XXA Unspecified injury of neck, initial encounter: Secondary | ICD-10-CM | POA: Diagnosis not present

## 2023-04-14 DIAGNOSIS — Z7951 Long term (current) use of inhaled steroids: Secondary | ICD-10-CM | POA: Diagnosis not present

## 2023-04-14 LAB — CBC WITH DIFFERENTIAL/PLATELET
Abs Immature Granulocytes: 0 10*3/uL (ref 0.00–0.07)
Band Neutrophils: 0 %
Basophils Absolute: 0 10*3/uL (ref 0.0–0.1)
Basophils Relative: 0 %
Blasts: 0 %
Eosinophils Absolute: 0 10*3/uL (ref 0.0–0.5)
Eosinophils Relative: 0 %
HCT: 34.9 % — ABNORMAL LOW (ref 36.0–46.0)
Hemoglobin: 10 g/dL — ABNORMAL LOW (ref 12.0–15.0)
Lymphocytes Relative: 35 %
Lymphs Abs: 2.2 10*3/uL (ref 0.7–4.0)
MCH: 28.1 pg (ref 26.0–34.0)
MCHC: 28.7 g/dL — ABNORMAL LOW (ref 30.0–36.0)
MCV: 98 fL (ref 80.0–100.0)
Metamyelocytes Relative: 0 %
Monocytes Absolute: 3.2 10*3/uL — ABNORMAL HIGH (ref 0.1–1.0)
Monocytes Relative: 49 %
Myelocytes: 0 %
Neutro Abs: 1 10*3/uL — ABNORMAL LOW (ref 1.7–7.7)
Neutrophils Relative %: 16 %
Other: 0 %
Platelets: 91 10*3/uL — ABNORMAL LOW (ref 150–400)
Promyelocytes Relative: 0 %
RBC: 3.56 MIL/uL — ABNORMAL LOW (ref 3.87–5.11)
RDW: 19.2 % — ABNORMAL HIGH (ref 11.5–15.5)
WBC: 6.4 10*3/uL (ref 4.0–10.5)
nRBC: 0 /100 WBC
nRBC: 0.5 % — ABNORMAL HIGH (ref 0.0–0.2)

## 2023-04-14 LAB — COMPREHENSIVE METABOLIC PANEL
ALT: 14 U/L (ref 0–44)
AST: 23 U/L (ref 15–41)
Albumin: 3.5 g/dL (ref 3.5–5.0)
Alkaline Phosphatase: 39 U/L (ref 38–126)
Anion gap: 10 (ref 5–15)
BUN: 27 mg/dL — ABNORMAL HIGH (ref 8–23)
CO2: 29 mmol/L (ref 22–32)
Calcium: 9.3 mg/dL (ref 8.9–10.3)
Chloride: 96 mmol/L — ABNORMAL LOW (ref 98–111)
Creatinine, Ser: 1.11 mg/dL — ABNORMAL HIGH (ref 0.44–1.00)
GFR, Estimated: 48 mL/min — ABNORMAL LOW (ref >60)
Glucose, Bld: 99 mg/dL (ref 70–99)
Potassium: 5.4 mmol/L — ABNORMAL HIGH (ref 3.5–5.1)
Sodium: 135 mmol/L (ref 135–145)
Total Bilirubin: 0.8 mg/dL (ref 0.3–1.2)
Total Protein: 8.9 g/dL — ABNORMAL HIGH (ref 6.5–8.1)

## 2023-04-14 LAB — URINALYSIS, ROUTINE W REFLEX MICROSCOPIC
Bilirubin Urine: NEGATIVE
Glucose, UA: NEGATIVE mg/dL
Hgb urine dipstick: NEGATIVE
Ketones, ur: NEGATIVE mg/dL
Leukocytes,Ua: NEGATIVE
Nitrite: NEGATIVE
Protein, ur: NEGATIVE mg/dL
Specific Gravity, Urine: 1.011 (ref 1.005–1.030)
pH: 7 (ref 5.0–8.0)

## 2023-04-14 LAB — POTASSIUM: Potassium: 5.4 mmol/L — ABNORMAL HIGH (ref 3.5–5.1)

## 2023-04-14 MED ORDER — LIDOCAINE-EPINEPHRINE-TETRACAINE (LET) TOPICAL GEL
3.0000 mL | Freq: Once | TOPICAL | Status: AC
  Administered 2023-04-14: 3 mL via TOPICAL
  Filled 2023-04-14: qty 3

## 2023-04-14 MED ORDER — SODIUM ZIRCONIUM CYCLOSILICATE 5 G PO PACK
10.0000 g | PACK | Freq: Once | ORAL | Status: AC
  Administered 2023-04-14: 10 g via ORAL
  Filled 2023-04-14: qty 2

## 2023-04-14 MED ORDER — SODIUM CHLORIDE 0.9 % IV BOLUS
500.0000 mL | Freq: Once | INTRAVENOUS | Status: AC
  Administered 2023-04-14: 500 mL via INTRAVENOUS

## 2023-04-14 NOTE — Discharge Instructions (Signed)
Your CT imaging, lab tests and exam are reassuring today.  Your potassium is elevated at 5.4, sure you are taking your every other day dosing of Lokelma as was prescribed at your discharge last week.  This will help keep your potassium under control.  I have placed an order for home evaluation to see if you are eligible for home physical therapy.  Expect a call from one of our providers to work on this for you.

## 2023-04-14 NOTE — ED Triage Notes (Signed)
[facility] reports pt coming from home for a fall. Pt with left eyebrow laceration. Pt states her legs have been weak since leaving hospital last week.

## 2023-04-14 NOTE — Progress Notes (Signed)
  Care Coordination  Outreach Note  04/14/2023 Name: Amaiya Scruton MRN: [id number] DOB: 1934-07-22   Care Coordination Outreach Attempts: A second unsuccessful outreach was attempted today to offer the patient with information about available care coordination services.  Follow Up Plan:  Additional outreach attempts will be made to offer the patient care coordination information and services.   Encounter Outcome:  No Answer  Christie Nottingham  Care Coordination Care Guide  Direct Dial: [phone]

## 2023-04-14 NOTE — ED Provider Notes (Signed)
[place] EMERGENCY DEPARTMENT AT [facility] Provider Note   CSN: [id number] Arrival date & time: 04/14/23  1518     History  Chief Complaint  Patient presents with   Lisa [name]    Lisa [name] is a 89 y.o. female with history including hypertension, lung cancer, hypothyroidism who is just discharged from here on June 7 where she was treated for non-STEMI, being medically managed, also has a history of paroxysmal A-fib, was treated for hyponatremia and also had hyperkalemia presenting for evaluation of generalized weakness.  Associated abdominal discomfort.  Headaches have worsened she has been here she reports this weakness has been present since severe being discharged from the hospital.  Today she was trying to ambulate to the car for her daughter to bring her to an appointment when her legs got weak and she fell hitting her left forehead on the cement.  She denies LOC.  She has a small laceration to her left eyebrow..  She reports generalized weakness, not focally weak, also denies dizziness, palpitations, also denies chest pain or shortness of breath.  She expresses interest in physical therapy which she has had in the past patient blood pressure her to help her gain her strength back from this hospitalization.  She is not interested in being admitted to a rehab facility, but would like to be considered for home PT which she has had in the past.   The history is provided by the patient.       Home Medications Prior to Admission medications   Medication Sig Start Date End Date Taking? Authorizing Provider  acetaminophen (TYLENOL) 325 MG tablet Take 2 tablets (650 mg total) by mouth every 6 (six) hours. 11/11/20   Angiulli, Mcarthur Rossetti, PA-C  albuterol (VENTOLIN HFA) 108 (90 Base) MCG/ACT inhaler Inhale 1-2 puffs into the lungs every 4 (four) hours as needed for wheezing or shortness of breath. 05/25/22   [provider]  Fluticasone-Umeclidin-Vilant (TRELEGY ELLIPTA)  100-62.5-25 MCG/ACT AEPB Inhale 1 puff into the lungs daily. 12/18/22   Oretha Milch, MD  furosemide (LASIX) 20 MG tablet Take 1 tablet (20 mg total) by mouth daily. 04/09/23   Johnson, Clanford L, MD  gabapentin (NEURONTIN) 100 MG capsule Take 1 capsule (100 mg total) by mouth every 12 (twelve) hours as needed (neuropathy). 02/11/22 04/06/23  Vassie Loll, MD  isosorbide mononitrate (IMDUR) 30 MG 24 hr tablet Take 1 tablet (30 mg total) by mouth daily. 04/10/23   Cleora Fleet, MD  levothyroxine (SYNTHROID) 100 MCG tablet Take 1 tablet (100 mcg total) by mouth daily before breakfast. 08/28/21   Johnson, Clanford L, MD  LOKELMA 10 g PACK packet Take 10 g by mouth every other day. 04/12/23   Johnson, Clanford L, MD  magnesium oxide (MAG-OX) 400 MG tablet Take 400 mg by mouth 2 (two) times daily.    [provider]  metoprolol succinate (TOPROL-XL) 25 MG 24 hr tablet TAKE 1 TABLET DAILY 02/18/23   Mallipeddi, Vishnu P, MD  mirtazapine (REMERON) 7.5 MG tablet Take 1 tablet (7.5 mg total) by mouth at bedtime. 05/28/21   Artis Delay, MD  Multiple Vitamin (MULTI-VITAMIN) tablet Take 1 tablet by mouth daily.    [provider]  pantoprazole (PROTONIX) 40 MG tablet Take 1 tablet (40 mg total) by mouth 2 (two) times daily. 04/09/23   Johnson, Clanford L, MD  pravastatin (PRAVACHOL) 10 MG tablet Take 1 tablet (10 mg total) by mouth every evening. 04/09/23   Laural Benes, Clanford  L, MD      Allergies    Meloxicam and Micardis hct [telmisartan-hctz]    Review of Systems   Review of Systems  Constitutional:  Negative for fever.  HENT:  Negative for congestion and sore throat.   Eyes: Negative.   Respiratory:  Negative for chest tightness and shortness of breath.   Cardiovascular:  Negative for chest pain.  Gastrointestinal:  Negative for abdominal pain and nausea.  Genitourinary: Negative.   Musculoskeletal:  Negative for arthralgias, joint swelling and neck pain.  Skin:  Positive for wound.  Negative for rash.  Neurological:  Positive for weakness. Negative for dizziness, light-headedness, numbness and headaches.  Psychiatric/Behavioral: Negative.    All other systems reviewed and are negative.   Physical Exam Updated Vital Signs BP (!) 148/69   Pulse 72   Temp 98.3 F (36.8 C) (Oral)   Resp (!) 26   SpO2 97%  Physical Exam Vitals and nursing note reviewed.  Constitutional:      Appearance: She is well-developed.  HENT:     Head: Normocephalic.     Mouth/Throat:     Mouth: Mucous membranes are moist.  Eyes:     Conjunctiva/sclera: Conjunctivae normal.  Cardiovascular:     Rate and Rhythm: Normal rate and regular rhythm.     Heart sounds: Normal heart sounds.  Pulmonary:     Effort: Pulmonary effort is normal.     Breath sounds: Normal breath sounds. No wheezing.  Abdominal:     General: Bowel sounds are normal.     Palpations: Abdomen is soft.     Tenderness: There is no abdominal tenderness.  Musculoskeletal:        General: Normal range of motion.     Cervical back: Normal range of motion.  Skin:    General: Skin is warm and dry.  Neurological:     General: No focal deficit present.     Mental Status: She is alert and oriented to person, place, and time.     Cranial Nerves: Cranial nerves 2-12 are intact.     Sensory: Sensation is intact.     Motor: Weakness present.     Coordination: Coordination is intact.     ED Results / Procedures / Treatments   Labs (all labs ordered are listed, but only abnormal results are displayed) Labs Reviewed  CBC WITH DIFFERENTIAL/PLATELET - Abnormal; Notable for the following components:      Result Value   RBC 3.56 (*)    Hemoglobin 10.0 (*)    HCT 34.9 (*)    MCHC 28.7 (*)    RDW 19.2 (*)    Platelets 91 (*)    nRBC 0.5 (*)    Neutro Abs 1.0 (*)    Monocytes Absolute 3.2 (*)    All other components within normal limits  COMPREHENSIVE METABOLIC PANEL - Abnormal; Notable for the following components:    Potassium 5.4 (*)    Chloride 96 (*)    BUN 27 (*)    Creatinine, Ser 1.11 (*)    Total Protein 8.9 (*)    GFR, Estimated 48 (*)    All other components within normal limits  POTASSIUM - Abnormal; Notable for the following components:   Potassium 5.4 (*)    All other components within normal limits  URINALYSIS, ROUTINE W REFLEX MICROSCOPIC  PATHOLOGIST SMEAR REVIEW    EKG None  Radiology CT Head Wo Contrast  Result Date: 04/14/2023 CLINICAL DATA:  Fall EXAM: CT HEAD WITHOUT  CONTRAST CT CERVICAL SPINE WITHOUT CONTRAST TECHNIQUE: Multidetector CT imaging of the head and cervical spine was performed following the standard protocol without intravenous contrast. Multiplanar CT image reconstructions of the cervical spine were also generated. RADIATION DOSE REDUCTION: This exam was performed according to the departmental dose-optimization program which includes automated exposure control, adjustment of the mA and/or kV according to patient size and/or use of iterative reconstruction technique. COMPARISON:  08/26/2021 CT head, no prior CT cervical spine FINDINGS: CT HEAD FINDINGS Brain: No evidence of acute infarct, hemorrhage, mass, mass effect, or midline shift. No hydrocephalus or extra-axial fluid collection. Periventricular white matter changes, likely the sequela of chronic small vessel ischemic disease. Age related cerebral volume loss. Vascular: No hyperdense vessel. Skull: Negative for fracture or focal lesion. Sinuses/Orbits: Mucosal thickening in the posterior left ethmoid air cells and left sphenoid sinus. Status post bilateral lens replacements. Other: The mastoid air cells are well aerated. CT CERVICAL SPINE FINDINGS Alignment: No traumatic listhesis. Trace anterolisthesis of C4 on C5, trace retrolisthesis of C5 on C6, and trace anterolisthesis of C6 on C7 appears facet mediated. Skull base and vertebrae: No acute fracture or suspicious osseous lesion. Soft tissues and spinal canal: No  prevertebral fluid or swelling. No visible canal hematoma. Disc levels: Degenerative changes in the cervical spine. No high-grade spinal canal stenosis. Upper chest: Apical pleural-parenchymal scarring. No pleural effusion or focal pulmonary opacity in the imaged lung apices. IMPRESSION: 1. No acute intracranial process. 2. No acute fracture or traumatic listhesis in the cervical spine. Electronically Signed   By: Wiliam Ke M.D.   On: 04/14/2023 19:06   CT Cervical Spine Wo Contrast  Result Date: 04/14/2023 CLINICAL DATA:  Fall EXAM: CT HEAD WITHOUT CONTRAST CT CERVICAL SPINE WITHOUT CONTRAST TECHNIQUE: Multidetector CT imaging of the head and cervical spine was performed following the standard protocol without intravenous contrast. Multiplanar CT image reconstructions of the cervical spine were also generated. RADIATION DOSE REDUCTION: This exam was performed according to the departmental dose-optimization program which includes automated exposure control, adjustment of the mA and/or kV according to patient size and/or use of iterative reconstruction technique. COMPARISON:  08/26/2021 CT head, no prior CT cervical spine FINDINGS: CT HEAD FINDINGS Brain: No evidence of acute infarct, hemorrhage, mass, mass effect, or midline shift. No hydrocephalus or extra-axial fluid collection. Periventricular white matter changes, likely the sequela of chronic small vessel ischemic disease. Age related cerebral volume loss. Vascular: No hyperdense vessel. Skull: Negative for fracture or focal lesion. Sinuses/Orbits: Mucosal thickening in the posterior left ethmoid air cells and left sphenoid sinus. Status post bilateral lens replacements. Other: The mastoid air cells are well aerated. CT CERVICAL SPINE FINDINGS Alignment: No traumatic listhesis. Trace anterolisthesis of C4 on C5, trace retrolisthesis of C5 on C6, and trace anterolisthesis of C6 on C7 appears facet mediated. Skull base and vertebrae: No acute fracture or  suspicious osseous lesion. Soft tissues and spinal canal: No prevertebral fluid or swelling. No visible canal hematoma. Disc levels: Degenerative changes in the cervical spine. No high-grade spinal canal stenosis. Upper chest: Apical pleural-parenchymal scarring. No pleural effusion or focal pulmonary opacity in the imaged lung apices. IMPRESSION: 1. No acute intracranial process. 2. No acute fracture or traumatic listhesis in the cervical spine. Electronically Signed   By: Wiliam Ke M.D.   On: 04/14/2023 19:06    Procedures Procedures    Medications Ordered in ED Medications  sodium chloride 0.9 % bolus 500 mL (0 mLs Intravenous Stopped 04/14/23 2020)  sodium  zirconium cyclosilicate (LOKELMA) packet 10 g (10 g Oral Given 04/14/23 2243)  lidocaine-EPINEPHrine-tetracaine (LET) topical gel (3 mLs Topical Given 04/14/23 2244)    ED Course/ Medical Decision Making/ A&P                             Medical Decision Making Patient presented with a fall after her legs got weak and that she was trying to ambulate to her car, called son and daughter were present but were unable to catch her.  She usually uses a walker did not have her walker with her during this event.  States that she has felt generally weaker since her admission last week where she was treated for non-STEMI.  She denies chest pain at this time.  She has a laceration at her left eyebrow, has no other complaints of pain from today's fall.  We discussed rehab secondary to weakness, she is not interested in being placed in a rehab facility but states she has had home PT in the past and would be interested in pursuing another course of home PT.  Face-to-face order has been placed for this.  Patient was encouraged to use her walker at all times when ambulating.  Amount and/or Complexity of Data Reviewed Labs: ordered.    Details: Labs are stable, she has an elevated potassium at 5.4, this is a chronic finding, she was discharged home from  her hospitalization with a prescription for q. OD Lokelma.  An additional dose was given her here.  Additionally she has a platelet count of 91, this is also a stable finding, she has myelo dysplastic disease.  Hemoglobin is 10.0 also stable.   Radiology: ordered.    Details: CT head and C-spine negative for acute intracranial injuries.  Risk Prescription drug management. Decision regarding hospitalization. Risk Details: There is no indication for hospitalization.  Attempted to contact patient's daughter to discuss findings and plan, was unable to reach her on her cell phone.           Final Clinical Impression(s) / ED Diagnoses Final diagnoses:  Fall, initial encounter  Injury of head, initial encounter  Weakness    Rx / DC Orders ED Discharge Orders          Ordered    Home Health        04/14/23 2330    Face-to-face encounter (required for Medicare/Medicaid patients)       Comments: I Burgess Amor certify that this patient is under my care and that I, or a nurse practitioner or physician's assistant working with me, had a face-to-face encounter that meets the physician face-to-face encounter requirements with this patient on 04/14/2023. The encounter with the patient was in whole, or in part for the following medical condition(s) which is the primary reason for home health care (List medical condition): generalized weakness and deconditioning since she was discharged from inpatient services on 04/09/23   04/14/23 2330              Victoriano Lain 04/14/23 2337    Vanetta Mulders, MD 04/19/23 1208

## 2023-04-15 NOTE — ED Notes (Signed)
Pt able to stand and ambulate in room with walker. EDP made aware

## 2023-04-19 DIAGNOSIS — W19XXXD Unspecified fall, subsequent encounter: Secondary | ICD-10-CM | POA: Diagnosis not present

## 2023-04-19 DIAGNOSIS — G4700 Insomnia, unspecified: Secondary | ICD-10-CM | POA: Diagnosis not present

## 2023-04-19 DIAGNOSIS — J9610 Chronic respiratory failure, unspecified whether with hypoxia or hypercapnia: Secondary | ICD-10-CM | POA: Diagnosis not present

## 2023-04-19 DIAGNOSIS — E875 Hyperkalemia: Secondary | ICD-10-CM | POA: Diagnosis not present

## 2023-04-19 DIAGNOSIS — D6189 Other specified aplastic anemias and other bone marrow failure syndromes: Secondary | ICD-10-CM | POA: Diagnosis not present

## 2023-04-19 DIAGNOSIS — D500 Iron deficiency anemia secondary to blood loss (chronic): Secondary | ICD-10-CM | POA: Diagnosis not present

## 2023-04-19 DIAGNOSIS — I350 Nonrheumatic aortic (valve) stenosis: Secondary | ICD-10-CM | POA: Diagnosis not present

## 2023-04-19 DIAGNOSIS — I480 Paroxysmal atrial fibrillation: Secondary | ICD-10-CM | POA: Diagnosis not present

## 2023-04-19 DIAGNOSIS — D61818 Other pancytopenia: Secondary | ICD-10-CM | POA: Diagnosis not present

## 2023-04-19 DIAGNOSIS — D469 Myelodysplastic syndrome, unspecified: Secondary | ICD-10-CM | POA: Diagnosis not present

## 2023-04-19 DIAGNOSIS — J441 Chronic obstructive pulmonary disease with (acute) exacerbation: Secondary | ICD-10-CM | POA: Diagnosis not present

## 2023-04-19 DIAGNOSIS — I222 Subsequent non-ST elevation (NSTEMI) myocardial infarction: Secondary | ICD-10-CM | POA: Diagnosis not present

## 2023-04-19 LAB — INTELLIGEN MYELOID

## 2023-04-19 NOTE — Progress Notes (Signed)
  Care Coordination  Outreach Note  04/19/2023 Name: Viva Chambliss MRN: [id number] DOB: [birth year]   Care Coordination Outreach Attempts: A third unsuccessful outreach was attempted today to offer the patient with information about available care coordination services.  Follow Up Plan:  No further outreach attempts will be made at this time. We have been unable to contact the patient to offer or enroll patient in care coordination services  Encounter Outcome:  No Answer  Christie Nottingham  Care Coordination Care Guide  Direct Dial: [phone] .

## 2023-04-23 DIAGNOSIS — K219 Gastro-esophageal reflux disease without esophagitis: Secondary | ICD-10-CM | POA: Diagnosis not present

## 2023-04-23 DIAGNOSIS — J4489 Other specified chronic obstructive pulmonary disease: Secondary | ICD-10-CM | POA: Diagnosis not present

## 2023-04-23 DIAGNOSIS — M1990 Unspecified osteoarthritis, unspecified site: Secondary | ICD-10-CM | POA: Diagnosis not present

## 2023-04-23 DIAGNOSIS — D500 Iron deficiency anemia secondary to blood loss (chronic): Secondary | ICD-10-CM | POA: Diagnosis not present

## 2023-04-23 DIAGNOSIS — I480 Paroxysmal atrial fibrillation: Secondary | ICD-10-CM | POA: Diagnosis not present

## 2023-04-23 DIAGNOSIS — Z87440 Personal history of urinary (tract) infections: Secondary | ICD-10-CM | POA: Diagnosis not present

## 2023-04-23 DIAGNOSIS — I222 Subsequent non-ST elevation (NSTEMI) myocardial infarction: Secondary | ICD-10-CM | POA: Diagnosis not present

## 2023-04-23 DIAGNOSIS — G609 Hereditary and idiopathic neuropathy, unspecified: Secondary | ICD-10-CM | POA: Diagnosis not present

## 2023-04-23 DIAGNOSIS — E785 Hyperlipidemia, unspecified: Secondary | ICD-10-CM | POA: Diagnosis not present

## 2023-04-23 DIAGNOSIS — Z9181 History of falling: Secondary | ICD-10-CM | POA: Diagnosis not present

## 2023-04-23 DIAGNOSIS — I130 Hypertensive heart and chronic kidney disease with heart failure and stage 1 through stage 4 chronic kidney disease, or unspecified chronic kidney disease: Secondary | ICD-10-CM | POA: Diagnosis not present

## 2023-04-23 DIAGNOSIS — G4700 Insomnia, unspecified: Secondary | ICD-10-CM | POA: Diagnosis not present

## 2023-04-23 DIAGNOSIS — D469 Myelodysplastic syndrome, unspecified: Secondary | ICD-10-CM | POA: Diagnosis not present

## 2023-04-23 DIAGNOSIS — Z7951 Long term (current) use of inhaled steroids: Secondary | ICD-10-CM | POA: Diagnosis not present

## 2023-04-23 DIAGNOSIS — E875 Hyperkalemia: Secondary | ICD-10-CM | POA: Diagnosis not present

## 2023-04-23 DIAGNOSIS — I700 Atherosclerosis of aorta: Secondary | ICD-10-CM | POA: Diagnosis not present

## 2023-04-23 DIAGNOSIS — I350 Nonrheumatic aortic (valve) stenosis: Secondary | ICD-10-CM | POA: Diagnosis not present

## 2023-04-23 DIAGNOSIS — N189 Chronic kidney disease, unspecified: Secondary | ICD-10-CM | POA: Diagnosis not present

## 2023-04-23 DIAGNOSIS — J9611 Chronic respiratory failure with hypoxia: Secondary | ICD-10-CM | POA: Diagnosis not present

## 2023-04-23 DIAGNOSIS — H9193 Unspecified hearing loss, bilateral: Secondary | ICD-10-CM | POA: Diagnosis not present

## 2023-04-23 DIAGNOSIS — Z9981 Dependence on supplemental oxygen: Secondary | ICD-10-CM | POA: Diagnosis not present

## 2023-04-23 DIAGNOSIS — E039 Hypothyroidism, unspecified: Secondary | ICD-10-CM | POA: Diagnosis not present

## 2023-04-23 DIAGNOSIS — I5022 Chronic systolic (congestive) heart failure: Secondary | ICD-10-CM | POA: Diagnosis not present

## 2023-04-23 DIAGNOSIS — I714 Abdominal aortic aneurysm, without rupture, unspecified: Secondary | ICD-10-CM | POA: Diagnosis not present

## 2023-04-28 ENCOUNTER — Other Ambulatory Visit: Payer: Self-pay | Admitting: Student

## 2023-04-28 DIAGNOSIS — D469 Myelodysplastic syndrome, unspecified: Secondary | ICD-10-CM

## 2023-04-28 DIAGNOSIS — Z01812 Encounter for preprocedural laboratory examination: Secondary | ICD-10-CM

## 2023-04-28 NOTE — H&P (Signed)
Chief Complaint: Patient was seen in consultation today for MDS/CMML  Referring Physician(s): Katragadda,Sreedhar  Supervising Physician: Gilmer Mor  Patient Status: [facility] - Out-pt  History of Present Illness: Lisa [name] is a 89 y.o. 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 Lisa [name] was noted to have worsening fatigue, weight loss, proximal muscle weakness, nausea and vomiting in late 2021 and CBC revealed pancytopenia, macrocytosis and severe neutropenia (ANC < 0.5). She underwent bone marrow biopsy in IR 11/06/2020 with Dr. Loreta Ave and pathology showed hypercellular marrow with trilineage dyspoiesis, polytypic plasmacytosis, pancytopenia. She was diagnosed with MDS/low risk CMML and has been receiving Aranesp per hematology/oncology. She was last seen by Dr. [name] where she reported occasional melena and was found to have worsening pancytopenia for which a bone marrow biopsy was recommended to further evaluate for disease progression.  Advance Care Plan: The advanced care plan/surrogate decision maker was discussed at the time of visit and documented in the medical record. Patient desires FULL CODE.  Past Medical History:  Diagnosis Date   Adenocarcinoma of left lung (HCC) 2006   Arthritis    Asthma    Cancer of breast, female (HCC)    Cancer of lung (HCC)    Hypertension    Hypothyroidism    Invasive ductal carcinoma of left breast (HCC) 1999   Neutropenia (HCC) 06/09/2016   Personal history of radiation therapy     Past Surgical History:  Procedure Laterality Date   BIOPSY  10/23/2020   Procedure: BIOPSY;  Surgeon: Benancio Deeds, MD;  Location: [facility] ENDOSCOPY;  Service: Gastroenterology;;   BREAST BIOPSY     left axillary node dissection   BREAST  LUMPECTOMY Left    COLONOSCOPY  03/08/2003   ZOX:[id number] rectal polyps destroyed with the tip of the snare/Polyps at hepatic flexure, splenic flexure at 35 cm/Left-sided diverticula: unable to retrieve path   COLONOSCOPY  09/12/2008   JXB:JYNWGN rectum and distal sigmoid diminutive polyps/scattered left sided diverticulum. hyperplastic   COLONOSCOPY N/A 03/06/2013   FAO:ZHYQMVH polyp-removed as described above; colonic diverticulosis. hyperplastic polyps. next TCS 03/2018   ESOPHAGOGASTRODUODENOSCOPY (EGD) WITH PROPOFOL N/A 10/23/2020   Procedure: ESOPHAGOGASTRODUODENOSCOPY (EGD) WITH PROPOFOL;  Surgeon: Benancio Deeds, MD;  Location: [facility] ENDOSCOPY;  Service: Gastroenterology;  Laterality: N/A;   FOOT SURGERY     LUNG REMOVAL, PARTIAL     upper lobe    Allergies: Meloxicam and Micardis hct [telmisartan-hctz]  Medications: Prior to Admission medications   Medication Sig Start Date End Date Taking? Authorizing Provider  acetaminophen (TYLENOL) 325 MG tablet Take 2 tablets (650 mg total) by mouth every 6 (six) hours. 11/11/20   Angiulli, Mcarthur Rossetti, PA-C  albuterol (VENTOLIN HFA) 108 (90 Base) MCG/ACT inhaler Inhale 1-2 puffs into the lungs every 4 (four) hours as needed for wheezing or shortness of breath. 05/25/22   [provider]  Fluticasone-Umeclidin-Vilant (TRELEGY ELLIPTA) 100-62.5-25 MCG/ACT AEPB Inhale 1 puff into the lungs daily. 12/18/22   Oretha Milch, MD  furosemide (LASIX) 20 MG tablet Take 1 tablet (20 mg total) by mouth daily. 04/09/23   Johnson, Clanford L, MD  gabapentin (NEURONTIN) 100 MG capsule Take 1 capsule (100 mg total) by mouth every 12 (twelve) hours as needed (neuropathy). 02/11/22 04/06/23  Vassie Loll, MD  isosorbide mononitrate (IMDUR) 30 MG 24 hr tablet  Take 1 tablet (30 mg total) by mouth daily. 04/10/23   Cleora Fleet, MD  levothyroxine (SYNTHROID) 100 MCG tablet Take 1 tablet (100 mcg total) by mouth daily before breakfast. 08/28/21   Johnson,  Clanford L, MD  LOKELMA 10 g PACK packet Take 10 g by mouth every other day. 04/12/23   Johnson, Clanford L, MD  magnesium oxide (MAG-OX) 400 MG tablet Take 400 mg by mouth 2 (two) times daily.    [provider]  metoprolol succinate (TOPROL-XL) 25 MG 24 hr tablet TAKE 1 TABLET DAILY 02/18/23   Mallipeddi, Vishnu P, MD  mirtazapine (REMERON) 7.5 MG tablet Take 1 tablet (7.5 mg total) by mouth at bedtime. 05/28/21   Artis Delay, MD  Multiple Vitamin (MULTI-VITAMIN) tablet Take 1 tablet by mouth daily.    [provider]  pantoprazole (PROTONIX) 40 MG tablet Take 1 tablet (40 mg total) by mouth 2 (two) times daily. 04/09/23   Johnson, Clanford L, MD  pravastatin (PRAVACHOL) 10 MG tablet Take 1 tablet (10 mg total) by mouth every evening. 04/09/23   Cleora Fleet, MD     Family History  Problem Relation Age of Onset   Cancer Mother    Cancer Sister    Colon cancer Neg Hx     Social History   Socioeconomic History   Marital status: Widowed    Spouse name: Not on file   Number of children: Not on file   Years of education: Not on file   Highest education level: Not on file  Occupational History   Not on file  Tobacco Use   Smoking status: Former    Packs/day: 0.75    Years: 35.00    Additional pack years: 0.00    Total pack years: 26.25    Types: Cigarettes    Quit date: 10    Years since quitting: 31.5   Smokeless tobacco: Never   Tobacco comments:    smoke-free X 30 yeras  Vaping Use   Vaping Use: Never used  Substance and Sexual Activity   Alcohol use: No   Drug use: No   Sexual activity: Not Currently  Other Topics Concern   Not on file  Social History Narrative   Not on file   Social Determinants of Health   Financial Resource Strain: Not on file  Food Insecurity: No Food Insecurity (01/15/2023)   Hunger Vital Sign    Worried About Running Out of Food in the Last Year: Never true    Ran Out of Food in the Last Year: Never true   Transportation Needs: No Transportation Needs (01/15/2023)   PRAPARE - Administrator, Civil Service (Medical): No    Lack of Transportation (Non-Medical): No  Physical Activity: Inactive (12/12/2020)   Exercise Vital Sign    Days of Exercise per Week: 0 days    Minutes of Exercise per Session: 0 min  Stress: Not on file  Social Connections: Not on file     Review of Systems: A 12 point ROS discussed and pertinent positives are indicated in the HPI above.  All other systems are negative.  Review of Systems  Constitutional:  Negative for chills and fever.  Respiratory:  Negative for cough and shortness of breath (wears 2L oxygen at home continuously).   Cardiovascular:  Negative for chest pain.  Gastrointestinal:  Negative for abdominal pain, nausea and vomiting.  Neurological:  Negative for dizziness and headaches.    Vital Signs: BP 135/75 (  BP Location: Right Arm)   Pulse 78   Temp 98.6 F (37 C) (Oral)   Resp 18   SpO2 93%   Physical Exam Vitals reviewed.  Constitutional:      General: She is not in acute distress. HENT:     Head: Normocephalic.     Mouth/Throat:     Mouth: Mucous membranes are moist.     Pharynx: Oropharynx is clear. No oropharyngeal exudate or posterior oropharyngeal erythema.     Comments: Edentulous Cardiovascular:     Rate and Rhythm: Normal rate and regular rhythm.  Pulmonary:     Effort: Pulmonary effort is normal.     Breath sounds: Normal breath sounds.     Comments: (+) 2L O2 via [place] Abdominal:     General: There is no distension.     Palpations: Abdomen is soft.     Tenderness: There is no abdominal tenderness.  Skin:    General: Skin is warm and dry.  Neurological:     Mental Status: She is alert and oriented to person, place, and time.  Psychiatric:        Mood and Affect: Mood normal.        Behavior: Behavior normal.        Thought Content: Thought content normal.        Judgment: Judgment normal.      MD  Evaluation Airway: WNL (edentulous) Heart: WNL Abdomen: WNL Chest/ Lungs: WNL ASA  Classification: 3 Mallampati/Airway Score: One   Imaging: CT Head Wo Contrast  Result Date: 04/14/2023 CLINICAL DATA:  Fall EXAM: CT HEAD WITHOUT CONTRAST CT CERVICAL SPINE WITHOUT CONTRAST TECHNIQUE: Multidetector CT imaging of the head and cervical spine was performed following the standard protocol without intravenous contrast. Multiplanar CT image reconstructions of the cervical spine were also generated. RADIATION DOSE REDUCTION: This exam was performed according to the departmental dose-optimization program which includes automated exposure control, adjustment of the mA and/or kV according to patient size and/or use of iterative reconstruction technique. COMPARISON:  08/26/2021 CT head, no prior CT cervical spine FINDINGS: CT HEAD FINDINGS Brain: No evidence of acute infarct, hemorrhage, mass, mass effect, or midline shift. No hydrocephalus or extra-axial fluid collection. Periventricular white matter changes, likely the sequela of chronic small vessel ischemic disease. Age related cerebral volume loss. Vascular: No hyperdense vessel. Skull: Negative for fracture or focal lesion. Sinuses/Orbits: Mucosal thickening in the posterior left ethmoid air cells and left sphenoid sinus. Status post bilateral lens replacements. Other: The mastoid air cells are well aerated. CT CERVICAL SPINE FINDINGS Alignment: No traumatic listhesis. Trace anterolisthesis of C4 on C5, trace retrolisthesis of C5 on C6, and trace anterolisthesis of C6 on C7 appears facet mediated. Skull base and vertebrae: No acute fracture or suspicious osseous lesion. Soft tissues and spinal canal: No prevertebral fluid or swelling. No visible canal hematoma. Disc levels: Degenerative changes in the cervical spine. No high-grade spinal canal stenosis. Upper chest: Apical pleural-parenchymal scarring. No pleural effusion or focal pulmonary opacity in the  imaged lung apices. IMPRESSION: 1. No acute intracranial process. 2. No acute fracture or traumatic listhesis in the cervical spine. Electronically Signed   By: Wiliam Ke M.D.   On: 04/14/2023 19:06   CT Cervical Spine Wo Contrast  Result Date: 04/14/2023 CLINICAL DATA:  Fall EXAM: CT HEAD WITHOUT CONTRAST CT CERVICAL SPINE WITHOUT CONTRAST TECHNIQUE: Multidetector CT imaging of the head and cervical spine was performed following the standard protocol without intravenous contrast. Multiplanar CT image reconstructions  of the cervical spine were also generated. RADIATION DOSE REDUCTION: This exam was performed according to the departmental dose-optimization program which includes automated exposure control, adjustment of the mA and/or kV according to patient size and/or use of iterative reconstruction technique. COMPARISON:  08/26/2021 CT head, no prior CT cervical spine FINDINGS: CT HEAD FINDINGS Brain: No evidence of acute infarct, hemorrhage, mass, mass effect, or midline shift. No hydrocephalus or extra-axial fluid collection. Periventricular white matter changes, likely the sequela of chronic small vessel ischemic disease. Age related cerebral volume loss. Vascular: No hyperdense vessel. Skull: Negative for fracture or focal lesion. Sinuses/Orbits: Mucosal thickening in the posterior left ethmoid air cells and left sphenoid sinus. Status post bilateral lens replacements. Other: The mastoid air cells are well aerated. CT CERVICAL SPINE FINDINGS Alignment: No traumatic listhesis. Trace anterolisthesis of C4 on C5, trace retrolisthesis of C5 on C6, and trace anterolisthesis of C6 on C7 appears facet mediated. Skull base and vertebrae: No acute fracture or suspicious osseous lesion. Soft tissues and spinal canal: No prevertebral fluid or swelling. No visible canal hematoma. Disc levels: Degenerative changes in the cervical spine. No high-grade spinal canal stenosis. Upper chest: Apical pleural-parenchymal  scarring. No pleural effusion or focal pulmonary opacity in the imaged lung apices. IMPRESSION: 1. No acute intracranial process. 2. No acute fracture or traumatic listhesis in the cervical spine. Electronically Signed   By: Wiliam Ke M.D.   On: 04/14/2023 19:06   ECHOCARDIOGRAM COMPLETE  Result Date: 04/07/2023    ECHOCARDIOGRAM REPORT   Patient Name:   Lisa [name] Date of Exam: 04/07/2023 Medical Rec #:  [id number]      Height:       64.0 in Accession #:    [id number]     Weight:       148.0 lb Date of Birth:  [birth year]      BSA:          1.721 m Patient Age:    89 years       BP:           173/83 mmHg Patient Gender: F              HR:           71 bpm. [facility] Procedure: 2D Echo, Cardiac Doppler and Color Doppler Indications:    Elevated Troponin  History:        Patient has prior history of Echocardiogram examinations, most                 recent 11/04/2022. NSTEMI, COPD, Aortic Valve Disease,                 Arrythmias:Atrial Fibrillation; Risk Factors:Former Smoker.  Sonographer:    Aron Baba Referring Phys: [phone] DAVID TAT IMPRESSIONS  1. Left ventricular ejection fraction, by estimation, is 60 to 65%. The left ventricle has normal function. The left ventricle has no regional wall motion abnormalities. There is mild left ventricular hypertrophy. Left ventricular diastolic parameters are consistent with Grade II diastolic dysfunction (pseudonormalization).  2. Right ventricular systolic function is mildly reduced. The right ventricular size is mildly enlarged. There is severely elevated pulmonary artery systolic pressure. The estimated right ventricular systolic pressure is 90.3 mmHg.  3. Left atrial size was severely dilated.  4. Right atrial size was severely dilated.  5. The mitral valve is normal in structure. Mild mitral valve regurgitation. No evidence of mitral stenosis.  6. The aortic valve is calcified. There  is severe calcifcation of the aortic valve. Aortic valve  regurgitation is not visualized. Moderate to severe aortic valve stenosis. Aortic valve mean gradient measures 31.0 mmHg. Aortic valve Vmax measures 3.54 m/s. DVI is 0.26.  7. The inferior vena cava is dilated in size with <50% respiratory variability, suggesting right atrial pressure of 15 mmHg. Comparison(s): Changes from prior study are noted. Moderate to severe aortic valve stenosis, Mean AV gradient increased from 22 mm Hg in 11/2022 to 31 mm Hg now. FINDINGS  Left Ventricle: Left ventricular ejection fraction, by estimation, is 60 to 65%. The left ventricle has normal function. The left ventricle has no regional wall motion abnormalities. The left ventricular internal cavity size was normal in size. There is  mild left ventricular hypertrophy. Left ventricular diastolic parameters are consistent with Grade II diastolic dysfunction (pseudonormalization). Right Ventricle: The right ventricular size is mildly enlarged. No increase in right ventricular wall thickness. Right ventricular systolic function is mildly reduced. There is severely elevated pulmonary artery systolic pressure. The tricuspid regurgitant velocity is 4.34 m/s, and with an assumed right atrial pressure of 15 mmHg, the estimated right ventricular systolic pressure is 90.3 mmHg. Left Atrium: Left atrial size was severely dilated. Right Atrium: Right atrial size was severely dilated. Pericardium: There is no evidence of pericardial effusion. Mitral Valve: The mitral valve is normal in structure. Mild mitral valve regurgitation. No evidence of mitral valve stenosis. MV peak gradient, 9.0 mmHg. The mean mitral valve gradient is 3.0 mmHg. Tricuspid Valve: The tricuspid valve is normal in structure. Tricuspid valve regurgitation is mild . No evidence of tricuspid stenosis. Aortic Valve: The aortic valve is calcified. There is severe calcifcation of the aortic valve. Aortic valve regurgitation is not visualized. Moderate to severe aortic stenosis is  present. Aortic valve mean gradient measures 31.0 mmHg. Aortic valve peak gradient measures 50.1 mmHg. Aortic valve area, by VTI measures 0.61 cm. Pulmonic Valve: The pulmonic valve was grossly normal. Pulmonic valve regurgitation is mild. No evidence of pulmonic stenosis. Aorta: The aortic root and ascending aorta are structurally normal, with no evidence of dilitation. Venous: The inferior vena cava is dilated in size with less than 50% respiratory variability, suggesting right atrial pressure of 15 mmHg. IAS/Shunts: There is right bowing of the interatrial septum, suggestive of elevated left atrial pressure. No atrial level shunt detected by color flow Doppler.  LEFT VENTRICLE PLAX 2D LVIDd:         4.10 cm   Diastology LVIDs:         2.80 cm   LV e' medial:    6.81 cm/s LV PW:         1.20 cm   LV E/e' medial:  21.3 LV IVS:        1.20 cm   LV e' lateral:   10.90 cm/s LVOT diam:     1.70 cm   LV E/e' lateral: 13.3 LV SV:         47 LV SV Index:   27 LVOT Area:     2.27 cm  LEFT ATRIUM              Index        RIGHT ATRIUM           Index LA diam:        4.30 cm  2.50 cm/m   RA Area:     22.90 cm LA Vol (A2C):   104.0 ml 60.42 ml/m  RA Volume:   62.30  ml  36.19 ml/m LA Vol (A4C):   101.0 ml 58.67 ml/m LA Biplane Vol: 109.0 ml 63.32 ml/m  AORTIC VALVE                     PULMONIC VALVE AV Area (Vmax):    0.59 cm      PR End Diast Vel: 6.81 msec AV Area (Vmean):   0.57 cm AV Area (VTI):     0.61 cm AV Vmax:           354.00 cm/s AV Vmean:          238.667 cm/s AV VTI:            0.767 m AV Peak Grad:      50.1 mmHg AV Mean Grad:      31.0 mmHg LVOT Vmax:         91.30 cm/s LVOT Vmean:        60.400 cm/s LVOT VTI:          0.205 m LVOT/AV VTI ratio: 0.27  AORTA Ao Root diam: 3.10 cm Ao Asc diam:  2.90 cm MITRAL VALVE                TRICUSPID VALVE MV Area (PHT): 5.13 cm     TR Peak grad:   75.3 mmHg MV Area VTI:   1.27 cm     TR Vmax:        434.00 cm/s MV Peak grad:  9.0 mmHg MV Mean grad:  3.0 mmHg      SHUNTS MV Vmax:       1.50 m/s     Systemic VTI:  0.20 m MV Vmean:      76.9 cm/s    Systemic Diam: 1.70 cm MV Decel Time: 148 msec MR Peak grad: 150.1 mmHg MR Mean grad: 97.0 mmHg MR Vmax:      612.50 cm/s MR Vmean:     464.0 cm/s MV E velocity: 145.00 cm/s MV A velocity: 80.80 cm/s MV E/A ratio:  1.79 Vishnu Priya Mallipeddi Electronically signed by Winfield Rast Mallipeddi Signature Date/Time: 04/07/2023/5:18:59 PM    Final    DG Chest Port 1 View  Result Date: 04/06/2023 CLINICAL DATA:  Shortness of breath EXAM: PORTABLE CHEST 1 VIEW COMPARISON:  11/03/2022 FINDINGS: Mild cardiomegaly. Aortic atherosclerosis. There is hyperinflation of the lungs compatible with COPD. Mild interstitial prominence is similar to prior study, likely chronic lung disease. Bibasilar linear densities compatible with atelectasis or scarring. No effusions. IMPRESSION: COPD/chronic changes. Cardiomegaly. Bibasilar atelectasis or scarring. Electronically Signed   By: Charlett Nose M.D.   On: 04/06/2023 12:40    Labs:  CBC: Recent Labs    04/07/23 0430 04/08/23 0415 04/08/23 1519 04/09/23 0426 04/14/23 1816  WBC 1.5* 1.9*  --  5.3 6.4  HGB 7.7* 7.8* 8.9* 9.4* 10.0*  HCT 25.0* 25.4* 29.0* 30.9* 34.9*  PLT 64* 77*  --  96* 91*    COAGS: No results for input(s): "INR", "APTT" in the last 8760 hours.  BMP: Recent Labs    04/06/23 1205 04/08/23 0415 04/09/23 0426 04/14/23 1816 04/14/23 2036  NA 128* 132* 136 135  --   K 5.4* 4.8 3.5 5.4* 5.4*  CL 93* 94* 94* 96*  --   CO2 28 28 32 29  --   GLUCOSE 102* 136* 85 99  --   BUN 22 25* 28* 27*  --   CALCIUM 8.7* 9.0 9.6 9.3  --  CREATININE 0.93 1.15* 1.09* 1.11*  --   GFRNONAA 59* 46* 49* 48*  --     LIVER FUNCTION TESTS: Recent Labs    02/12/23 0802 04/01/23 0901 04/06/23 1205 04/14/23 1816  BILITOT 0.8 0.7 0.8 0.8  AST 25 28 32 23  ALT 10 13 15 14   ALKPHOS 47 42 36* 39  PROT 9.4* 9.2* 8.8* 8.9*  ALBUMIN 3.7 3.6 3.2* 3.5    TUMOR MARKERS: No  results for input(s): "AFPTM", "CEA", "CA199", "CHROMGRNA" in the last 8760 hours.  Assessment and Plan:  89 y/o F with history of MDS/CMML with worsening pancytopenia who presents today for bone marrow aspiration/biopsy to further direct care.  Risks and benefits of bone marrow aspiration/biopsy was discussed with the patient and/or patient's family including, but not limited to bleeding, infection, damage to adjacent structures or low yield requiring additional tests.  All of the questions were answered and there is agreement to proceed.  Consent signed and in chart.  Thank you for this interesting consult.  I greatly enjoyed meeting Lisa [name] and look forward to participating in their care.  A copy of this report was sent to the requesting provider on this date.  Electronically Signed: Villa Herb, PA-C 04/28/2023, 8:32 AM   I spent a total of 30 Minutes  in face to face in clinical consultation, greater than 50% of which was counseling/coordinating care for MDS/CMML

## 2023-04-29 ENCOUNTER — Encounter (HOSPITAL_COMMUNITY): Payer: Self-pay

## 2023-04-29 ENCOUNTER — Ambulatory Visit (HOSPITAL_COMMUNITY)
Admission: RE | Admit: 2023-04-29 | Discharge: 2023-04-29 | Disposition: A | Discharge status: Home / Self Care | Payer: Medicare Other | Source: Ambulatory Visit | Attending: Hematology | Admitting: Hematology

## 2023-04-29 DIAGNOSIS — D759 Disease of blood and blood-forming organs, unspecified: Secondary | ICD-10-CM | POA: Diagnosis not present

## 2023-04-29 DIAGNOSIS — Z01812 Encounter for preprocedural laboratory examination: Secondary | ICD-10-CM

## 2023-04-29 DIAGNOSIS — D61818 Other pancytopenia: Secondary | ICD-10-CM | POA: Insufficient documentation

## 2023-04-29 DIAGNOSIS — D469 Myelodysplastic syndrome, unspecified: Secondary | ICD-10-CM | POA: Insufficient documentation

## 2023-04-29 DIAGNOSIS — D72821 Monocytosis (symptomatic): Secondary | ICD-10-CM | POA: Diagnosis not present

## 2023-04-29 HISTORY — DX: Cardiac arrhythmia, unspecified: I49.9

## 2023-04-29 HISTORY — DX: Acute myocardial infarction, unspecified: I21.9

## 2023-04-29 LAB — CBC
HCT: 31.5 % — ABNORMAL LOW (ref 36.0–46.0)
Hemoglobin: 9.2 g/dL — ABNORMAL LOW (ref 12.0–15.0)
MCH: 28.5 pg (ref 26.0–34.0)
MCHC: 29.2 g/dL — ABNORMAL LOW (ref 30.0–36.0)
MCV: 97.5 fL (ref 80.0–100.0)
Platelets: 55 10*3/uL — ABNORMAL LOW (ref 150–400)
RBC: 3.23 MIL/uL — ABNORMAL LOW (ref 3.87–5.11)
RDW: 18.7 % — ABNORMAL HIGH (ref 11.5–15.5)
WBC: 2.4 10*3/uL — ABNORMAL LOW (ref 4.0–10.5)
nRBC: 1.7 % — ABNORMAL HIGH (ref 0.0–0.2)

## 2023-04-29 MED ORDER — MIDAZOLAM HCL 2 MG/2ML IJ SOLN
INTRAMUSCULAR | Status: AC
  Filled 2023-04-29: qty 2

## 2023-04-29 MED ORDER — MIDAZOLAM HCL 2 MG/2ML IJ SOLN
INTRAMUSCULAR | Status: AC | PRN
  Administered 2023-04-29 (×2): .5 mg via INTRAVENOUS

## 2023-04-29 MED ORDER — FENTANYL CITRATE (PF) 100 MCG/2ML IJ SOLN
INTRAMUSCULAR | Status: AC | PRN
  Administered 2023-04-29 (×2): 25 ug via INTRAVENOUS

## 2023-04-29 MED ORDER — SODIUM CHLORIDE 0.9 % IV SOLN: INTRAVENOUS | Status: DC

## 2023-04-29 MED ORDER — FENTANYL CITRATE (PF) 100 MCG/2ML IJ SOLN
INTRAMUSCULAR | Status: AC
  Filled 2023-04-29: qty 2

## 2023-04-29 NOTE — Procedures (Signed)
Interventional Radiology Procedure Note  Procedure: CT guided aspirate and core biopsy of right posterior iliac bone Complications: None Recommendations: - Bedrest supine x 1 hrs - OTC's PRN  Pain - Follow biopsy results  Signed,  Ozella Comins S. Kalyssa Anker, DO    

## 2023-04-29 NOTE — Discharge Instructions (Signed)
Please call Interventional Radiology clinic [phone] with any questions or concerns. ? ?You may remove your dressing and shower tomorrow. ? ? ?Bone Marrow Aspiration and Bone Marrow Biopsy, Adult, Care After ?This sheet gives you information about how to care for yourself after your procedure. Your health care provider may also give you more specific instructions. If you have problems or questions, contact your health care provider. ?What can I expect after the procedure? ?After the procedure, it is common to have: ?Mild pain and tenderness. ?Swelling. ?Bruising. ?Follow these instructions at home: ?Puncture site care ?Follow instructions from your health care provider about how to take care of the puncture site. Make sure you: ?Wash your hands with soap and water before and after you change your bandage (dressing). If soap and water are not available, use hand sanitizer. ?Change your dressing as told by your health care provider. ?Check your puncture site every day for signs of infection. Check for: ?More redness, swelling, or pain. ?Fluid or blood. ?Warmth. ?Pus or a bad smell.   ?Activity ?Return to your normal activities as told by your health care provider. Ask your health care provider what activities are safe for you. ?Do not lift anything that is heavier than 10 lb (4.5 kg), or the limit that you are told, until your health care provider says that it is safe. ?Do not drive for 24 hours if you were given a sedative during your procedure. ?General instructions ?Take over-the-counter and prescription medicines only as told by your health care provider. ?Do not take baths, swim, or use a hot tub until your health care provider approves. Ask your health care provider if you may take showers. You may only be allowed to take sponge baths. ?If directed, put ice on the affected area. To do this: ?Put ice in a plastic bag. ?Place a towel between your skin and the bag. ?Leave the ice on for 20 minutes, 2-3 times a  day. ?Keep all follow-up visits as told by your health care provider. This is important.   ?Contact a health care provider if: ?Your pain is not controlled with medicine. ?You have a fever. ?You have more redness, swelling, or pain around the puncture site. ?You have fluid or blood coming from the puncture site. ?Your puncture site feels warm to the touch. ?You have pus or a bad smell coming from the puncture site. ?Summary ?After the procedure, it is common to have mild pain, tenderness, swelling, and bruising. ?Follow instructions from your health care provider about how to take care of the puncture site and what activities are safe for you. ?Take over-the-counter and prescription medicines only as told by your health care provider. ?Contact a health care provider if you have any signs of infection, such as fluid or blood coming from the puncture site. ?This information is not intended to replace advice given to you by your health care provider. Make sure you discuss any questions you have with your health care provider. ?Document Revised: 03/07/2019 Document Reviewed: 03/07/2019 ?Elsevier Patient Education ? 2021 [employer] ? ? ?Moderate Conscious Sedation, Adult, Care After ?This sheet gives you information about how to care for yourself after your procedure. Your health care provider may also give you more specific instructions. If you have problems or questions, contact your health care provider. ?What can I expect after the procedure? ?After the procedure, it is common to have: ?Sleepiness for several hours. ?Impaired judgment for several hours. ?Difficulty with balance. ?Vomiting if you eat too   soon. ?Follow these instructions at home: ?For the time period you were told by your health care provider: ?Rest. ?Do not participate in activities where you could fall or become injured. ?Do not drive or use machinery. ?Do not drink alcohol. ?Do not take sleeping pills or medicines that cause drowsiness. ?Do not  make important decisions or sign legal documents. ?Do not take care of children on your own.  ?  ?  ?Eating and drinking ?Follow the diet recommended by your health care provider. ?Drink enough fluid to keep your urine pale yellow. ?If you vomit: ?Drink water, juice, or soup when you can drink without vomiting. ?Make sure you have little or no nausea before eating solid foods.   ?General instructions ?Take over-the-counter and prescription medicines only as told by your health care provider. ?Have a responsible adult stay with you for the time you are told. It is important to have someone help care for you until you are awake and alert. ?Do not smoke. ?Keep all follow-up visits as told by your health care provider. This is important. ?Contact a health care provider if: ?You are still sleepy or having trouble with balance after 24 hours. ?You feel light-headed. ?You keep feeling nauseous or you keep vomiting. ?You develop a rash. ?You have a fever. ?You have redness or swelling around the IV site. ?Get help right away if: ?You have trouble breathing. ?You have new-onset confusion at home. ?Summary ?After the procedure, it is common to feel sleepy, have impaired judgment, or feel nauseous if you eat too soon. ?Rest after you get home. Know the things you should not do after the procedure. ?Follow the diet recommended by your health care provider and drink enough fluid to keep your urine pale yellow. ?Get help right away if you have trouble breathing or new-onset confusion at home. ?This information is not intended to replace advice given to you by your health care provider. Make sure you discuss any questions you have with your health care provider. ?Document Revised: 02/16/2020 Document Reviewed: 09/14/2019 ?Elsevier Patient Education ? 2021 [employer]  ?

## 2023-05-03 DIAGNOSIS — I222 Subsequent non-ST elevation (NSTEMI) myocardial infarction: Secondary | ICD-10-CM | POA: Diagnosis not present

## 2023-05-03 DIAGNOSIS — I5022 Chronic systolic (congestive) heart failure: Secondary | ICD-10-CM | POA: Diagnosis not present

## 2023-05-03 DIAGNOSIS — J4489 Other specified chronic obstructive pulmonary disease: Secondary | ICD-10-CM | POA: Diagnosis not present

## 2023-05-03 DIAGNOSIS — J9611 Chronic respiratory failure with hypoxia: Secondary | ICD-10-CM | POA: Diagnosis not present

## 2023-05-03 DIAGNOSIS — I480 Paroxysmal atrial fibrillation: Secondary | ICD-10-CM | POA: Diagnosis not present

## 2023-05-03 DIAGNOSIS — I130 Hypertensive heart and chronic kidney disease with heart failure and stage 1 through stage 4 chronic kidney disease, or unspecified chronic kidney disease: Secondary | ICD-10-CM | POA: Diagnosis not present

## 2023-05-04 ENCOUNTER — Encounter (HOSPITAL_COMMUNITY): Payer: Self-pay | Admitting: Hematology

## 2023-05-04 ENCOUNTER — Inpatient Hospital Stay: Payer: Medicare Other | Admitting: Hematology

## 2023-05-04 ENCOUNTER — Inpatient Hospital Stay: Payer: Medicare Other

## 2023-05-04 ENCOUNTER — Inpatient Hospital Stay: Payer: Medicare Other | Attending: Hematology

## 2023-05-04 VITALS — BP 117/66 | HR 68 | Temp 97.6°F | Resp 18

## 2023-05-04 DIAGNOSIS — D539 Nutritional anemia, unspecified: Secondary | ICD-10-CM

## 2023-05-04 DIAGNOSIS — C9310 Chronic myelomonocytic leukemia not having achieved remission: Secondary | ICD-10-CM | POA: Diagnosis not present

## 2023-05-04 DIAGNOSIS — Z79899 Other long term (current) drug therapy: Secondary | ICD-10-CM | POA: Insufficient documentation

## 2023-05-04 DIAGNOSIS — D4620 Refractory anemia with excess of blasts, unspecified: Secondary | ICD-10-CM | POA: Insufficient documentation

## 2023-05-04 DIAGNOSIS — D469 Myelodysplastic syndrome, unspecified: Secondary | ICD-10-CM

## 2023-05-04 LAB — CBC
HCT: 30.2 % — ABNORMAL LOW (ref 36.0–46.0)
Hemoglobin: 8.6 g/dL — ABNORMAL LOW (ref 12.0–15.0)
MCH: 28.6 pg (ref 26.0–34.0)
MCHC: 28.5 g/dL — ABNORMAL LOW (ref 30.0–36.0)
MCV: 100.3 fL — ABNORMAL HIGH (ref 80.0–100.0)
Platelets: 57 10*3/uL — ABNORMAL LOW (ref 150–400)
RBC: 3.01 MIL/uL — ABNORMAL LOW (ref 3.87–5.11)
RDW: 19 % — ABNORMAL HIGH (ref 11.5–15.5)
WBC: 1.9 10*3/uL — ABNORMAL LOW (ref 4.0–10.5)
nRBC: 3.1 % — ABNORMAL HIGH (ref 0.0–0.2)

## 2023-05-04 MED ORDER — DARBEPOETIN ALFA 200 MCG/0.4ML IJ SOSY
200.0000 ug | PREFILLED_SYRINGE | Freq: Once | INTRAMUSCULAR | Status: AC
  Administered 2023-05-04: 200 ug via SUBCUTANEOUS
  Filled 2023-05-04: qty 0.4

## 2023-05-04 NOTE — Progress Notes (Signed)
Patient tolerated injection with no complaints voiced.  Site clean and dry with no bruising or swelling noted at site.  See [name] for details.  Band aid applied.  Patient stable during and after injection.  Vss with discharge and left in satisfactory condition with no s/s of distress noted.  

## 2023-05-04 NOTE — Patient Instructions (Signed)
[facility]  Discharge Instructions: Thank you for choosing [facility] to provide your oncology and hematology care.  If you have a lab appointment with the Cancer Center - please note that after April 8th, 2024, all labs will be drawn in the cancer center.  You do not have to check in or register with the main entrance as you have in the past but will complete your check-in in the cancer center.  Wear comfortable clothing and clothing appropriate for easy access to any Portacath or PICC line.   We strive to give you quality time with your provider. You may need to reschedule your appointment if you arrive late (15 or more minutes).  Arriving late affects you and other patients whose appointments are after yours.  Also, if you miss three or more appointments without notifying the office, you may be dismissed from the clinic at the provider's discretion.      For prescription refill requests, have your pharmacy contact our office and allow 72 hours for refills to be completed.  To help prevent nausea and vomiting after your treatment, we encourage you to take your nausea medication as directed.  BELOW ARE SYMPTOMS THAT SHOULD BE REPORTED IMMEDIATELY: *FEVER GREATER THAN 100.4 F (38 C) OR HIGHER *CHILLS OR SWEATING *NAUSEA AND VOMITING THAT IS NOT CONTROLLED WITH YOUR NAUSEA MEDICATION *UNUSUAL SHORTNESS OF BREATH *UNUSUAL BRUISING OR BLEEDING *URINARY PROBLEMS (pain or burning when urinating, or frequent urination) *BOWEL PROBLEMS (unusual diarrhea, constipation, pain near the anus) TENDERNESS IN MOUTH AND THROAT WITH OR WITHOUT PRESENCE OF ULCERS (sore throat, sores in mouth, or a toothache) UNUSUAL RASH, SWELLING OR PAIN  UNUSUAL VAGINAL DISCHARGE OR ITCHING   Items with * indicate a potential emergency and should be followed up as soon as possible or go to the Emergency Department if any problems should occur.  Please show the CHEMOTHERAPY ALERT CARD or  IMMUNOTHERAPY ALERT CARD at check-in to the Emergency Department and triage nurse.  Should you have questions after your visit or need to cancel or reschedule your appointment, please contact [facility] [phone]  and follow the prompts.  Office hours are 8:00 a.m. to 4:30 p.m. Monday - Friday. Please note that voicemails left after 4:00 p.m. may not be returned until the following business day.  We are closed weekends and major holidays. You have access to a nurse at all times for urgent questions. Please call the main number to the clinic [phone] and follow the prompts.  For any non-urgent questions, you may also contact your provider using MyChart. We now offer e-Visits for anyone 18 and older to request care online for non-urgent symptoms. For details visit [email or web address].   Also download the MyChart app! Go to the app store, search "MyChart", open the app, select [facility], and log in with your MyChart username and password.   

## 2023-05-05 LAB — SURGICAL PATHOLOGY

## 2023-05-07 ENCOUNTER — Ambulatory Visit: Payer: Medicare Other | Admitting: Internal Medicine

## 2023-05-07 DIAGNOSIS — I130 Hypertensive heart and chronic kidney disease with heart failure and stage 1 through stage 4 chronic kidney disease, or unspecified chronic kidney disease: Secondary | ICD-10-CM | POA: Diagnosis not present

## 2023-05-07 DIAGNOSIS — J4489 Other specified chronic obstructive pulmonary disease: Secondary | ICD-10-CM | POA: Diagnosis not present

## 2023-05-07 DIAGNOSIS — I5022 Chronic systolic (congestive) heart failure: Secondary | ICD-10-CM | POA: Diagnosis not present

## 2023-05-07 DIAGNOSIS — I480 Paroxysmal atrial fibrillation: Secondary | ICD-10-CM | POA: Diagnosis not present

## 2023-05-07 DIAGNOSIS — J9611 Chronic respiratory failure with hypoxia: Secondary | ICD-10-CM | POA: Diagnosis not present

## 2023-05-07 DIAGNOSIS — I222 Subsequent non-ST elevation (NSTEMI) myocardial infarction: Secondary | ICD-10-CM | POA: Diagnosis not present

## 2023-05-11 NOTE — Progress Notes (Incomplete)
[facility] [address], [place] [address]    Clinic Day:  05/11/2023  Referring physician: [name], MD  Patient Care Team: [name], MD as PCP - General (Internal Medicine) Mallipeddi, Orion Modest, MD as PCP - Cardiology (Cardiology) Jena Gauss Gerrit Friends, MD as Consulting Physician (Gastroenterology)   ASSESSMENT & PLAN:   Assessment: 1.    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 Margo Aye for pancytopenia. Subsequently her white count has recovered along with recovery of the hemoglobin. -Bone marrow biopsy on 11/06/2020 shows hypercellular marrow with trilineage dyspoiesis and polytypic plasmacytosis.  Primary differential includes CMML and other MDS.  Definitive blast population is not identified morphologically or by flow.  There is a increased immature mononuclear cells favored to be monocytic in origin.  Megakaryocytes are frequently hypolobated and hyperchromatic without clustering.  Atypical appearing erythroid precursors.  Increased plasma cells 5 to 9% which are polytypic by kappa and lambda staining. - Chromosome analysis shows 46,X, idiocentric X chromosome[9]/46, XX[11]. -MDS FISH panel was negative. -Revised IPSS score of 3.  In the absence of treatment, median survival of 5.3 years and 25% AML progression is 10.8 years. -SPEP, methylmalonic acid, ferritin levels were normal. - Serum copper level is 87. - NGS (11/19/2020): SRSF2, TET2   2.  Stage I left breast cancer: -Diagnosed in Sep 1999, treated with lumpectomy and XRT. ER/PR was positive and received tamoxifen for 5 years. -Mammogram on 2019-10-30 was BI-RADS Category 2. Physical exam did not reveal any palpable masses today.   3.  Stage I adenocarcinoma of the left upper lobe of the lung: -Left upper lobectomy on 2005-06-01. CT chest on 2018-06-09 was stable. - CT CAP on 10/24/2020 shows stable postsurgical changes from the partial left upper lobe resection.  No other signs of  malignancy.    Plan: 1.   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 - Denies any bleeding per rectum.  However she reports occasional dark stools after recent diarrhea for the last 7 to 10 days. - Reviewed labs today: Ferritin was 533, percent saturation 19.  Hemoglobin is low at 8.3.  She was in the range of 9-10 for the past few months.  Platelet count also dropped to 44 and has been gradually decreasing.  LDH also increased to 287. - Based on these findings, I have recommended repeating bone marrow aspiration and biopsy with MDS FISH panel and cytogenetics. - Will send myeloid NGS panel and SPEP today. - She may receive Aranesp today.  I will see her back 2 weeks after the biopsy.  2.  Hyperkalemia: - She was started on Lokelma 3 months ago by Dr. Wolfgang Phoenix.  Potassium is 4.5.    No orders of the defined types were placed in this encounter.      Alben Deeds Teague,acting as a Neurosurgeon for Doreatha Massed, MD.,have documented all relevant documentation on the behalf of Doreatha Massed, MD,as directed by  Doreatha Massed, MD while in the presence of Doreatha Massed, MD.  ***   Bradley R [place]   7/9/20248:48 PM  CHIEF COMPLAINT:   Diagnosis: MDS   Cancer Staging  Invasive ductal carcinoma of left breast [facility]) Staging form: Breast, AJCC 7th Edition - Pathologic stage from 07/03/1998: Stage IA (T1a, N0, cM0) - Signed by Ellouise Newer, PA-C on 06/09/2016    Prior Therapy: None  Current Therapy:  Aransesp    HISTORY OF PRESENT ILLNESS:  Oncology History Overview Note  Called Dr Scharlene Gloss office again now, office closed. Pt will see Dr [name] and I will ask him if he can talk to Dr Margo Aye after his visit.  Thanks,   Adenocarcinoma of left lung (HCC)  06/23/2016 Imaging   CT chest- Stable exam.  No new or progressive findings. Previously described tiny perifissural nodules at the base of the right major fissure are stable and  likely represent subpleural lymph nodes.      INTERVAL HISTORY:   Lisa [name] is a 89 y.o. female presenting to clinic today for follow up of MDS. She was last seen by me on 04/01/23.  Since her last visit, patient was admitted to ED at [facility] on 6/4 for generalized weakness, SOB, chest pain, and decreased oral intake. She was diagnosed with NSTEMI, COPD with acute exacerbation, chronic respiratory failure with hypoxia, paroxysmal atrial fibrillation, nonrheumatic aortic valve stenosis, and pancytopenia. Patient was also admitted to the ED at [facility] on 6/12 for a fall, due to continued generalized weakness, that caused a small laceration to her left eyebrow. There were no further symptoms and patient was discharged.   Today, she states that she is doing well overall. Her appetite level is at ***%. Her energy level is at ***%.  PAST MEDICAL HISTORY:   Past Medical History: Past Medical History:  Diagnosis Date   Adenocarcinoma of left lung (HCC) 2006   Arthritis    Asthma    Cancer of breast, female (HCC)    Cancer of lung (HCC)    Dysrhythmia    Hypertension    Hypothyroidism    Invasive ductal carcinoma of left breast (HCC) 1999   Myocardial infarction (HCC)    Neutropenia (HCC) 06/09/2016   Personal history of radiation therapy     Surgical History: Past Surgical History:  Procedure Laterality Date   BIOPSY  10/23/2020   Procedure: BIOPSY;  Surgeon: Benancio Deeds, MD;  Location: [facility] ENDOSCOPY;  Service: Gastroenterology;;   BREAST BIOPSY     left axillary node dissection   BREAST LUMPECTOMY Left    COLONOSCOPY  03/08/2003   [id number] rectal polyps destroyed with the tip of the snare/Polyps at hepatic flexure, splenic flexure at 35 cm/Left-sided diverticula: unable to retrieve path   COLONOSCOPY  09/12/2008   JXB:[id number] rectum and distal sigmoid diminutive polyps/scattered left sided diverticulum. hyperplastic   COLONOSCOPY N/A 03/06/2013   [id number]  polyp-removed as described above; colonic diverticulosis. hyperplastic polyps. next TCS 03/2018   ESOPHAGOGASTRODUODENOSCOPY (EGD) WITH PROPOFOL N/A 10/23/2020   Procedure: ESOPHAGOGASTRODUODENOSCOPY (EGD) WITH PROPOFOL;  Surgeon: Benancio Deeds, MD;  Location: [facility] ENDOSCOPY;  Service: Gastroenterology;  Laterality: N/A;   FOOT SURGERY     LUNG REMOVAL, PARTIAL     upper lobe    Social History: Social History   Socioeconomic History   Marital status: Widowed    Spouse name: Not on file   Number of children: Not on file   Years of education: Not on file   Highest education level: Not on file  Occupational History   Not on file  Tobacco Use   Smoking status: Former    Packs/day: 0.75    Years: 35.00    Additional pack years: 0.00    Total pack years: 26.25    Types: Cigarettes    Quit date: 70    Years since quitting: 31.5   Smokeless tobacco: Never   Tobacco comments:    smoke-free X 30 yeras  Vaping Use  Vaping Use: Never used  Substance and Sexual Activity   Alcohol use: No   Drug use: No   Sexual activity: Not Currently  Other Topics Concern   Not on file  Social History Narrative   Not on file   Social Determinants of Health   Financial Resource Strain: Not on file  Food Insecurity: No Food Insecurity (01/15/2023)   Hunger Vital Sign    Worried About Running Out of Food in the Last Year: Never true    Ran Out of Food in the Last Year: Never true  Transportation Needs: No Transportation Needs (01/15/2023)   PRAPARE - Administrator, Civil Service (Medical): No    Lack of Transportation (Non-Medical): No  Physical Activity: Inactive (12/12/2020)   Exercise Vital Sign    Days of Exercise per Week: 0 days    Minutes of Exercise per Session: 0 min  Stress: Not on file  Social Connections: Not on file  Intimate Partner Violence: Not At Risk (01/15/2023)   Humiliation, Afraid, Rape, and Kick questionnaire    Fear of Current or Ex-Partner: No     Emotionally Abused: No    Physically Abused: No    Sexually Abused: No    Family History: Family History  Problem Relation Age of Onset   Cancer Mother    Cancer Sister    Colon cancer Neg Hx     Current Medications:  Current Outpatient Medications:    acetaminophen (TYLENOL) 325 MG tablet, Take 2 tablets (650 mg total) by mouth every 6 (six) hours., Disp: , Rfl:    albuterol (VENTOLIN HFA) 108 (90 Base) MCG/ACT inhaler, Inhale 1-2 puffs into the lungs every 4 (four) hours as needed for wheezing or shortness of breath., Disp: , Rfl:    Fluticasone-Umeclidin-Vilant (TRELEGY ELLIPTA) 100-62.5-25 MCG/ACT AEPB, Inhale 1 puff into the lungs daily., Disp: 28 each, Rfl: 0   furosemide (LASIX) 20 MG tablet, Take 1 tablet (20 mg total) by mouth daily., Disp: 30 tablet, Rfl: 1   gabapentin (NEURONTIN) 100 MG capsule, Take 1 capsule (100 mg total) by mouth every 12 (twelve) hours as needed (neuropathy)., Disp: 60 capsule, Rfl: 1   isosorbide mononitrate (IMDUR) 30 MG 24 hr tablet, Take 1 tablet (30 mg total) by mouth daily., Disp: 30 tablet, Rfl: 2   levothyroxine (SYNTHROID) 100 MCG tablet, Take 1 tablet (100 mcg total) by mouth daily before breakfast., Disp: 30 tablet, Rfl: 1   LOKELMA 10 g PACK packet, Take 10 g by mouth every other day., Disp: , Rfl:    magnesium oxide (MAG-OX) 400 MG tablet, Take 400 mg by mouth 2 (two) times daily., Disp: , Rfl:    metoprolol succinate (TOPROL-XL) 25 MG 24 hr tablet, TAKE 1 TABLET DAILY, Disp: 90 tablet, Rfl: 1   mirtazapine (REMERON) 7.5 MG tablet, Take 1 tablet (7.5 mg total) by mouth at bedtime., Disp: 30 tablet, Rfl: 2   Multiple Vitamin (MULTI-VITAMIN) tablet, Take 1 tablet by mouth daily., Disp: , Rfl:    pantoprazole (PROTONIX) 40 MG tablet, Take 1 tablet (40 mg total) by mouth 2 (two) times daily., Disp: 60 tablet, Rfl: 1   pravastatin (PRAVACHOL) 10 MG tablet, Take 1 tablet (10 mg total) by mouth every evening., Disp: 30 tablet, Rfl: 1    Allergies: Allergies  Allergen Reactions   Meloxicam Other (See Comments)    Caused an injury to the kidneys, per nephrologist   Micardis Hct [Telmisartan-Hctz] Other (See Comments)    Caused  an injury to the kidneys, per nephrologist    REVIEW OF SYSTEMS:   Review of Systems  Constitutional:  Negative for chills, fatigue and fever.  HENT:   Negative for lump/mass, mouth sores, nosebleeds, sore throat and trouble swallowing.   Eyes:  Negative for eye problems.  Respiratory:  Negative for cough and shortness of breath.   Cardiovascular:  Negative for chest pain, leg swelling and palpitations.  Gastrointestinal:  Negative for abdominal pain, constipation, diarrhea, nausea and vomiting.  Genitourinary:  Negative for bladder incontinence, difficulty urinating, dysuria, frequency, hematuria and nocturia.   Musculoskeletal:  Negative for arthralgias, back pain, flank pain, myalgias and neck pain.  Skin:  Negative for itching and rash.  Neurological:  Negative for dizziness, headaches and numbness.  Hematological:  Does not bruise/bleed easily.  Psychiatric/Behavioral:  Negative for depression, sleep disturbance and suicidal ideas. The patient is not nervous/anxious.   All other systems reviewed and are negative.    VITALS:   There were no vitals taken for this visit.  Wt Readings from Last 3 Encounters:  04/06/23 148 lb (67.1 kg)  04/01/23 152 lb 3.2 oz (69 kg)  12/18/22 140 lb (63.5 kg)    There is no height or weight on file to calculate BMI.  Performance status (ECOG): 1 - Symptomatic but completely ambulatory  PHYSICAL EXAM:   Physical Exam Vitals and nursing note reviewed. Exam conducted with a chaperone present.  Constitutional:      Appearance: Normal appearance.  Cardiovascular:     Rate and Rhythm: Normal rate and regular rhythm.     Pulses: Normal pulses.     Heart sounds: Normal heart sounds.  Pulmonary:     Effort: Pulmonary effort is normal.     Breath  sounds: Normal breath sounds.  Abdominal:     Palpations: Abdomen is soft. There is no hepatomegaly, splenomegaly or mass.     Tenderness: There is no abdominal tenderness.  Musculoskeletal:     Right lower leg: No edema.     Left lower leg: No edema.  Lymphadenopathy:     Cervical: No cervical adenopathy.     Right cervical: No superficial, deep or posterior cervical adenopathy.    Left cervical: No superficial, deep or posterior cervical adenopathy.     Upper Body:     Right upper body: No supraclavicular or axillary adenopathy.     Left upper body: No supraclavicular or axillary adenopathy.  Neurological:     General: No focal deficit present.     Mental Status: She is alert and oriented to person, place, and time.  Psychiatric:        Mood and Affect: Mood normal.        Behavior: Behavior normal.     LABS:      Latest Ref Rng & Units 05/04/2023    1:04 PM 04/29/2023    7:38 AM 04/14/2023    6:16 PM  CBC  WBC 4.0 - 10.5 K/uL 1.9  2.4  6.4   Hemoglobin 12.0 - 15.0 g/dL 8.6  9.2  16.1   Hematocrit 36.0 - 46.0 % 30.2  31.5  34.9   Platelets 150 - 400 K/uL 57  55  91       Latest Ref Rng & Units 04/14/2023    8:36 PM 04/14/2023    6:16 PM 04/09/2023    4:26 AM  CMP  Glucose 70 - 99 mg/dL  99  85   BUN 8 - 23 mg/dL  27  28   Creatinine 0.44 - 1.00 mg/dL  4.09  8.11   Sodium 914 - 145 mmol/L  135  136   Potassium 3.5 - 5.1 mmol/L 5.4  5.4  3.5   Chloride 98 - 111 mmol/L  96  94   CO2 22 - 32 mmol/L  29  32   Calcium 8.9 - 10.3 mg/dL  9.3  9.6   Total Protein 6.5 - 8.1 g/dL  8.9    Total Bilirubin 0.3 - 1.2 mg/dL  0.8    Alkaline Phos 38 - 126 U/L  39    AST 15 - 41 U/L  23    ALT 0 - 44 U/L  14       No results found for: "CEA1", "CEA" / No results found for: "CEA1", "CEA" No results found for: "PSA1" No results found for: "NWG956" No results found for: "CAN125"  Lab Results  Component Value Date   TOTALPROTELP 9.0 (H) 04/01/2023   ALBUMINELP 3.5 04/01/2023    A1GS 0.3 04/01/2023   A2GS 0.6 04/01/2023   BETS 1.2 04/01/2023   GAMS 3.5 (H) 04/01/2023   MSPIKE Not Observed 04/01/2023   SPEI Comment 04/01/2023   Lab Results  Component Value Date   TIBC 318 04/01/2023   TIBC 316 02/12/2023   TIBC 311 12/17/2022   FERRITIN 533 (H) 04/01/2023   FERRITIN 360 (H) 02/12/2023   FERRITIN 341 (H) 12/17/2022   IRONPCTSAT 19 04/01/2023   IRONPCTSAT 18 02/12/2023   IRONPCTSAT 20 12/17/2022   Lab Results  Component Value Date   LDH 287 (H) 04/01/2023   LDH 264 (H) 02/12/2023   LDH 314 (H) 11/03/2022     STUDIES:   CT BONE MARROW BIOPSY & ASPIRATION  Result Date: 04/29/2023 INDICATION: 89 year old female referred for bone marrow biopsy EXAM: CT BONE MARROW BIOPSY AND ASPIRATION MEDICATIONS: None. ANESTHESIA/SEDATION: Moderate (conscious) sedation was employed during this procedure. A total of Versed 1.0 mg and Fentanyl 50 mcg was administered intravenously. Moderate Sedation Time: 10 minutes. The patient's level of consciousness and vital signs were monitored continuously by radiology nursing throughout the procedure under my direct supervision. FLUOROSCOPY TIME:  CT COMPLICATIONS: None PROCEDURE: Informed written consent was obtained from the patient after a thorough discussion of the procedural risks, benefits and alternatives. All questions were addressed. Maximal Sterile Barrier Technique was utilized including caps, mask, sterile gowns, sterile gloves, sterile drape, hand hygiene and skin antiseptic. A timeout was performed prior to the initiation of the procedure. Scout CT of the pelvis was performed for surgical planning purposes. The posterior pelvis was prepped with Chlorhexidine in a sterile fashion, and a sterile drape was applied covering the operative field. A sterile gown and sterile gloves were used for the procedure. Local anesthesia was provided with 1% Lidocaine. Posterior right iliac bone was targeted for biopsy. The skin and subcutaneous  tissues were infiltrated with 1% lidocaine without epinephrine. A small stab incision was made with an 11 blade scalpel, and an 11 gauge Murphy needle was advanced with CT guidance to the posterior cortex. Manual forced was used to advance the needle through the posterior cortex and the stylet was removed. A bone marrow aspirate was retrieved and passed to a cytotechnologist in the room. The Murphy needle was then advanced without the stylet for a core biopsy. The core biopsy was retrieved and also passed to a cytotechnologist. Manual pressure was used for hemostasis and a sterile dressing was placed. No complications were encountered  no significant blood loss was encountered. Patient tolerated the procedure well and remained hemodynamically stable throughout. IMPRESSION: Status post image guided bone marrow biopsy. Signed, Yvone Neu. Miachel Roux, RPVI [facility] Radiology Electronically Signed   By: Gilmer Mor D.O.   On: 04/29/2023 12:58   CT Head Wo Contrast  Result Date: 04/14/2023 CLINICAL DATA:  Fall EXAM: CT HEAD WITHOUT CONTRAST CT CERVICAL SPINE WITHOUT CONTRAST TECHNIQUE: Multidetector CT imaging of the head and cervical spine was performed following the standard protocol without intravenous contrast. Multiplanar CT image reconstructions of the cervical spine were also generated. RADIATION DOSE REDUCTION: This exam was performed according to the departmental dose-optimization program which includes automated exposure control, adjustment of the mA and/or kV according to patient size and/or use of iterative reconstruction technique. COMPARISON:  08/26/2021 CT head, no prior CT cervical spine FINDINGS: CT HEAD FINDINGS Brain: No evidence of acute infarct, hemorrhage, mass, mass effect, or midline shift. No hydrocephalus or extra-axial fluid collection. Periventricular white matter changes, likely the sequela of chronic small vessel ischemic disease.  Age related cerebral volume loss. Vascular: No hyperdense vessel. Skull: Negative for fracture or focal lesion. Sinuses/Orbits: Mucosal thickening in the posterior left ethmoid air cells and left sphenoid sinus. Status post bilateral lens replacements. Other: The mastoid air cells are well aerated. CT CERVICAL SPINE FINDINGS Alignment: No traumatic listhesis. Trace anterolisthesis of C4 on C5, trace retrolisthesis of C5 on C6, and trace anterolisthesis of C6 on C7 appears facet mediated. Skull base and vertebrae: No acute fracture or suspicious osseous lesion. Soft tissues and spinal canal: No prevertebral fluid or swelling. No visible canal hematoma. Disc levels: Degenerative changes in the cervical spine. No high-grade spinal canal stenosis. Upper chest: Apical pleural-parenchymal scarring. No pleural effusion or focal pulmonary opacity in the imaged lung apices. IMPRESSION: 1. No acute intracranial process. 2. No acute fracture or traumatic listhesis in the cervical spine. Electronically Signed   By: Wiliam Ke M.D.   On: 04/14/2023 19:06   CT Cervical Spine Wo Contrast  Result Date: 04/14/2023 CLINICAL DATA:  Fall EXAM: CT HEAD WITHOUT CONTRAST CT CERVICAL SPINE WITHOUT CONTRAST TECHNIQUE: Multidetector CT imaging of the head and cervical spine was performed following the standard protocol without intravenous contrast. Multiplanar CT image reconstructions of the cervical spine were also generated. RADIATION DOSE REDUCTION: This exam was performed according to the departmental dose-optimization program which includes automated exposure control, adjustment of the mA and/or kV according to patient size and/or use of iterative reconstruction technique. COMPARISON:  08/26/2021 CT head, no prior CT cervical spine FINDINGS: CT HEAD FINDINGS Brain: No evidence of acute infarct, hemorrhage, mass, mass effect, or midline shift. No hydrocephalus or extra-axial fluid collection. Periventricular white matter changes,  likely the sequela of chronic small vessel ischemic disease. Age related cerebral volume loss. Vascular: No hyperdense vessel. Skull: Negative for fracture or focal lesion. Sinuses/Orbits: Mucosal thickening in the posterior left ethmoid air cells and left sphenoid sinus. Status post bilateral lens replacements. Other: The mastoid air cells are well aerated. CT CERVICAL SPINE FINDINGS Alignment: No traumatic listhesis. Trace anterolisthesis of C4 on C5, trace retrolisthesis of C5 on C6, and trace anterolisthesis of C6 on C7 appears facet mediated. Skull base and vertebrae: No acute fracture or suspicious osseous lesion. Soft tissues and spinal canal: No prevertebral fluid or swelling. No visible canal hematoma. Disc levels: Degenerative changes in the cervical spine. No high-grade spinal canal stenosis. Upper chest: Apical pleural-parenchymal scarring. No pleural  effusion or focal pulmonary opacity in the imaged lung apices. IMPRESSION: 1. No acute intracranial process. 2. No acute fracture or traumatic listhesis in the cervical spine. Electronically Signed   By: Wiliam Ke M.D.   On: 04/14/2023 19:06

## 2023-05-12 ENCOUNTER — Inpatient Hospital Stay: Payer: Medicare Other | Admitting: Hematology

## 2023-05-17 ENCOUNTER — Encounter (HOSPITAL_COMMUNITY): Payer: Self-pay

## 2023-05-17 LAB — SURGICAL PATHOLOGY

## 2023-05-20 ENCOUNTER — Encounter (HOSPITAL_COMMUNITY): Payer: Self-pay

## 2023-05-20 ENCOUNTER — Other Ambulatory Visit: Payer: Self-pay

## 2023-05-20 ENCOUNTER — Inpatient Hospital Stay (HOSPITAL_COMMUNITY): Payer: Medicare Other

## 2023-05-20 ENCOUNTER — Emergency Department (HOSPITAL_COMMUNITY): Payer: Medicare Other

## 2023-05-20 ENCOUNTER — Inpatient Hospital Stay (HOSPITAL_COMMUNITY)
Admission: EM | Admit: 2023-05-20 | Discharge: 2023-05-28 | DRG: 193 | Disposition: A | Discharge status: Rehabilitation Facility | Payer: Medicare Other | Attending: Internal Medicine | Admitting: Internal Medicine

## 2023-05-20 DIAGNOSIS — D61818 Other pancytopenia: Secondary | ICD-10-CM | POA: Diagnosis present

## 2023-05-20 DIAGNOSIS — I517 Cardiomegaly: Secondary | ICD-10-CM | POA: Diagnosis not present

## 2023-05-20 DIAGNOSIS — I2510 Atherosclerotic heart disease of native coronary artery without angina pectoris: Secondary | ICD-10-CM | POA: Diagnosis present

## 2023-05-20 DIAGNOSIS — J189 Pneumonia, unspecified organism: Principal | ICD-10-CM | POA: Diagnosis present

## 2023-05-20 DIAGNOSIS — D469 Myelodysplastic syndrome, unspecified: Secondary | ICD-10-CM | POA: Diagnosis present

## 2023-05-20 DIAGNOSIS — I700 Atherosclerosis of aorta: Secondary | ICD-10-CM | POA: Diagnosis not present

## 2023-05-20 DIAGNOSIS — E876 Hypokalemia: Secondary | ICD-10-CM | POA: Diagnosis not present

## 2023-05-20 DIAGNOSIS — I272 Pulmonary hypertension, unspecified: Secondary | ICD-10-CM | POA: Diagnosis not present

## 2023-05-20 DIAGNOSIS — D638 Anemia in other chronic diseases classified elsewhere: Secondary | ICD-10-CM | POA: Diagnosis not present

## 2023-05-20 DIAGNOSIS — I4819 Other persistent atrial fibrillation: Secondary | ICD-10-CM | POA: Diagnosis not present

## 2023-05-20 DIAGNOSIS — C9310 Chronic myelomonocytic leukemia not having achieved remission: Secondary | ICD-10-CM | POA: Diagnosis present

## 2023-05-20 DIAGNOSIS — Z79899 Other long term (current) drug therapy: Secondary | ICD-10-CM

## 2023-05-20 DIAGNOSIS — J9621 Acute and chronic respiratory failure with hypoxia: Secondary | ICD-10-CM | POA: Diagnosis present

## 2023-05-20 DIAGNOSIS — Z888 Allergy status to other drugs, medicaments and biological substances status: Secondary | ICD-10-CM

## 2023-05-20 DIAGNOSIS — F05 Delirium due to known physiological condition: Secondary | ICD-10-CM | POA: Diagnosis not present

## 2023-05-20 DIAGNOSIS — I252 Old myocardial infarction: Secondary | ICD-10-CM

## 2023-05-20 DIAGNOSIS — E871 Hypo-osmolality and hyponatremia: Secondary | ICD-10-CM | POA: Diagnosis present

## 2023-05-20 DIAGNOSIS — E8729 Other acidosis: Secondary | ICD-10-CM | POA: Diagnosis present

## 2023-05-20 DIAGNOSIS — J9622 Acute and chronic respiratory failure with hypercapnia: Secondary | ICD-10-CM | POA: Diagnosis present

## 2023-05-20 DIAGNOSIS — I5032 Chronic diastolic (congestive) heart failure: Secondary | ICD-10-CM | POA: Diagnosis not present

## 2023-05-20 DIAGNOSIS — R531 Weakness: Secondary | ICD-10-CM | POA: Diagnosis not present

## 2023-05-20 DIAGNOSIS — R5081 Fever presenting with conditions classified elsewhere: Secondary | ICD-10-CM

## 2023-05-20 DIAGNOSIS — I130 Hypertensive heart and chronic kidney disease with heart failure and stage 1 through stage 4 chronic kidney disease, or unspecified chronic kidney disease: Secondary | ICD-10-CM | POA: Diagnosis not present

## 2023-05-20 DIAGNOSIS — R350 Frequency of micturition: Secondary | ICD-10-CM | POA: Diagnosis not present

## 2023-05-20 DIAGNOSIS — N1832 Chronic kidney disease, stage 3b: Secondary | ICD-10-CM | POA: Diagnosis not present

## 2023-05-20 DIAGNOSIS — I480 Paroxysmal atrial fibrillation: Secondary | ICD-10-CM | POA: Diagnosis present

## 2023-05-20 DIAGNOSIS — D696 Thrombocytopenia, unspecified: Secondary | ICD-10-CM | POA: Diagnosis not present

## 2023-05-20 DIAGNOSIS — R509 Fever, unspecified: Secondary | ICD-10-CM | POA: Diagnosis present

## 2023-05-20 DIAGNOSIS — W19XXXA Unspecified fall, initial encounter: Secondary | ICD-10-CM | POA: Diagnosis present

## 2023-05-20 DIAGNOSIS — J441 Chronic obstructive pulmonary disease with (acute) exacerbation: Secondary | ICD-10-CM | POA: Diagnosis present

## 2023-05-20 DIAGNOSIS — J9611 Chronic respiratory failure with hypoxia: Secondary | ICD-10-CM | POA: Diagnosis not present

## 2023-05-20 DIAGNOSIS — Z923 Personal history of irradiation: Secondary | ICD-10-CM

## 2023-05-20 DIAGNOSIS — I350 Nonrheumatic aortic (valve) stenosis: Secondary | ICD-10-CM | POA: Diagnosis not present

## 2023-05-20 DIAGNOSIS — J9811 Atelectasis: Secondary | ICD-10-CM | POA: Diagnosis not present

## 2023-05-20 DIAGNOSIS — K5900 Constipation, unspecified: Secondary | ICD-10-CM | POA: Diagnosis not present

## 2023-05-20 DIAGNOSIS — D649 Anemia, unspecified: Secondary | ICD-10-CM | POA: Diagnosis not present

## 2023-05-20 DIAGNOSIS — I959 Hypotension, unspecified: Secondary | ICD-10-CM | POA: Diagnosis not present

## 2023-05-20 DIAGNOSIS — R627 Adult failure to thrive: Secondary | ICD-10-CM | POA: Diagnosis not present

## 2023-05-20 DIAGNOSIS — A419 Sepsis, unspecified organism: Secondary | ICD-10-CM | POA: Diagnosis not present

## 2023-05-20 DIAGNOSIS — D709 Neutropenia, unspecified: Secondary | ICD-10-CM | POA: Diagnosis not present

## 2023-05-20 DIAGNOSIS — Z853 Personal history of malignant neoplasm of breast: Secondary | ICD-10-CM

## 2023-05-20 DIAGNOSIS — N1831 Chronic kidney disease, stage 3a: Secondary | ICD-10-CM | POA: Diagnosis present

## 2023-05-20 DIAGNOSIS — I4891 Unspecified atrial fibrillation: Secondary | ICD-10-CM | POA: Diagnosis not present

## 2023-05-20 DIAGNOSIS — E785 Hyperlipidemia, unspecified: Secondary | ICD-10-CM | POA: Diagnosis not present

## 2023-05-20 DIAGNOSIS — Z515 Encounter for palliative care: Secondary | ICD-10-CM

## 2023-05-20 DIAGNOSIS — Z7952 Long term (current) use of systemic steroids: Secondary | ICD-10-CM

## 2023-05-20 DIAGNOSIS — Z9981 Dependence on supplemental oxygen: Secondary | ICD-10-CM

## 2023-05-20 DIAGNOSIS — I110 Hypertensive heart disease with heart failure: Secondary | ICD-10-CM | POA: Diagnosis not present

## 2023-05-20 DIAGNOSIS — R001 Bradycardia, unspecified: Secondary | ICD-10-CM

## 2023-05-20 DIAGNOSIS — I214 Non-ST elevation (NSTEMI) myocardial infarction: Secondary | ICD-10-CM | POA: Diagnosis not present

## 2023-05-20 DIAGNOSIS — E663 Overweight: Secondary | ICD-10-CM | POA: Diagnosis not present

## 2023-05-20 DIAGNOSIS — E039 Hypothyroidism, unspecified: Secondary | ICD-10-CM | POA: Diagnosis present

## 2023-05-20 DIAGNOSIS — J449 Chronic obstructive pulmonary disease, unspecified: Secondary | ICD-10-CM | POA: Diagnosis not present

## 2023-05-20 DIAGNOSIS — Z7189 Other specified counseling: Secondary | ICD-10-CM | POA: Diagnosis not present

## 2023-05-20 DIAGNOSIS — Z79890 Hormone replacement therapy (postmenopausal): Secondary | ICD-10-CM

## 2023-05-20 DIAGNOSIS — R918 Other nonspecific abnormal finding of lung field: Secondary | ICD-10-CM | POA: Diagnosis not present

## 2023-05-20 DIAGNOSIS — Z87891 Personal history of nicotine dependence: Secondary | ICD-10-CM

## 2023-05-20 DIAGNOSIS — N183 Chronic kidney disease, stage 3 unspecified: Secondary | ICD-10-CM | POA: Diagnosis present

## 2023-05-20 DIAGNOSIS — N179 Acute kidney failure, unspecified: Secondary | ICD-10-CM | POA: Diagnosis not present

## 2023-05-20 DIAGNOSIS — J440 Chronic obstructive pulmonary disease with acute lower respiratory infection: Secondary | ICD-10-CM | POA: Diagnosis present

## 2023-05-20 DIAGNOSIS — R7989 Other specified abnormal findings of blood chemistry: Secondary | ICD-10-CM | POA: Diagnosis present

## 2023-05-20 DIAGNOSIS — Z1152 Encounter for screening for COVID-19: Secondary | ICD-10-CM

## 2023-05-20 DIAGNOSIS — I5030 Unspecified diastolic (congestive) heart failure: Secondary | ICD-10-CM | POA: Diagnosis not present

## 2023-05-20 DIAGNOSIS — R0689 Other abnormalities of breathing: Secondary | ICD-10-CM | POA: Diagnosis not present

## 2023-05-20 DIAGNOSIS — Z902 Acquired absence of lung [part of]: Secondary | ICD-10-CM

## 2023-05-20 DIAGNOSIS — J168 Pneumonia due to other specified infectious organisms: Secondary | ICD-10-CM | POA: Diagnosis not present

## 2023-05-20 DIAGNOSIS — Z85118 Personal history of other malignant neoplasm of bronchus and lung: Secondary | ICD-10-CM

## 2023-05-20 DIAGNOSIS — C3492 Malignant neoplasm of unspecified part of left bronchus or lung: Secondary | ICD-10-CM | POA: Diagnosis present

## 2023-05-20 DIAGNOSIS — C50912 Malignant neoplasm of unspecified site of left female breast: Secondary | ICD-10-CM | POA: Diagnosis present

## 2023-05-20 DIAGNOSIS — R4182 Altered mental status, unspecified: Secondary | ICD-10-CM | POA: Diagnosis not present

## 2023-05-20 DIAGNOSIS — E872 Acidosis, unspecified: Secondary | ICD-10-CM | POA: Diagnosis not present

## 2023-05-20 DIAGNOSIS — R5381 Other malaise: Secondary | ICD-10-CM | POA: Diagnosis not present

## 2023-05-20 DIAGNOSIS — I7143 Infrarenal abdominal aortic aneurysm, without rupture: Secondary | ICD-10-CM | POA: Diagnosis not present

## 2023-05-20 DIAGNOSIS — E875 Hyperkalemia: Secondary | ICD-10-CM | POA: Diagnosis present

## 2023-05-20 DIAGNOSIS — J811 Chronic pulmonary edema: Secondary | ICD-10-CM | POA: Diagnosis not present

## 2023-05-20 DIAGNOSIS — Z713 Dietary counseling and surveillance: Secondary | ICD-10-CM | POA: Diagnosis not present

## 2023-05-20 DIAGNOSIS — I5031 Acute diastolic (congestive) heart failure: Secondary | ICD-10-CM | POA: Diagnosis present

## 2023-05-20 DIAGNOSIS — Z86010 Personal history of colonic polyps: Secondary | ICD-10-CM

## 2023-05-20 DIAGNOSIS — I10 Essential (primary) hypertension: Secondary | ICD-10-CM | POA: Diagnosis present

## 2023-05-20 DIAGNOSIS — J90 Pleural effusion, not elsewhere classified: Secondary | ICD-10-CM | POA: Diagnosis not present

## 2023-05-20 DIAGNOSIS — J9612 Chronic respiratory failure with hypercapnia: Secondary | ICD-10-CM | POA: Diagnosis not present

## 2023-05-20 DIAGNOSIS — Z8719 Personal history of other diseases of the digestive system: Secondary | ICD-10-CM

## 2023-05-20 DIAGNOSIS — I5033 Acute on chronic diastolic (congestive) heart failure: Secondary | ICD-10-CM | POA: Diagnosis not present

## 2023-05-20 LAB — COMPREHENSIVE METABOLIC PANEL
ALT: 52 U/L — ABNORMAL HIGH (ref 0–44)
AST: 70 U/L — ABNORMAL HIGH (ref 15–41)
Albumin: 3.2 g/dL — ABNORMAL LOW (ref 3.5–5.0)
Alkaline Phosphatase: 59 U/L (ref 38–126)
Anion gap: 5 (ref 5–15)
BUN: 15 mg/dL (ref 8–23)
CO2: 25 mmol/L (ref 22–32)
Calcium: 8.3 mg/dL — ABNORMAL LOW (ref 8.9–10.3)
Chloride: 92 mmol/L — ABNORMAL LOW (ref 98–111)
Creatinine, Ser: 1.3 mg/dL — ABNORMAL HIGH (ref 0.44–1.00)
GFR, Estimated: 40 mL/min — ABNORMAL LOW (ref >60)
Glucose, Bld: 111 mg/dL — ABNORMAL HIGH (ref 70–99)
Potassium: 6.5 mmol/L (ref 3.5–5.1)
Sodium: 122 mmol/L — ABNORMAL LOW (ref 135–145)
Total Bilirubin: 0.8 mg/dL (ref 0.3–1.2)
Total Protein: 8.2 g/dL — ABNORMAL HIGH (ref 6.5–8.1)

## 2023-05-20 LAB — BLOOD GAS, ARTERIAL
Acid-base deficit: 1.2 mmol/L (ref 0.0–2.0)
Acid-base deficit: 0.1 mmol/L (ref 0.0–2.0)
Bicarbonate: 28.2 mmol/L — ABNORMAL HIGH (ref 20.0–28.0)
Bicarbonate: 27.6 mmol/L (ref 20.0–28.0)
Drawn by: 270271
Drawn by: 35043
O2 Saturation: 99.7 %
O2 Saturation: 98.7 %
Patient temperature: 37.1
Patient temperature: 36.8
pCO2 arterial: 69 mmHg (ref 32–48)
pCO2 arterial: 62 mmHg — ABNORMAL HIGH (ref 32–48)
pH, Arterial: 7.22 — ABNORMAL LOW (ref 7.35–7.45)
pH, Arterial: 7.25 — ABNORMAL LOW (ref 7.35–7.45)
pO2, Arterial: 81 mmHg — ABNORMAL LOW (ref 83–108)
pO2, Arterial: 87 mmHg (ref 83–108)

## 2023-05-20 LAB — POTASSIUM: Potassium: 5.7 mmol/L — ABNORMAL HIGH (ref 3.5–5.1)

## 2023-05-20 LAB — RESP PANEL BY RT-PCR (RSV, FLU A&B, COVID)  RVPGX2
Influenza A by PCR: NEGATIVE
Influenza B by PCR: NEGATIVE
Resp Syncytial Virus by PCR: NEGATIVE
SARS Coronavirus 2 by RT PCR: NEGATIVE

## 2023-05-20 LAB — URINALYSIS, W/ REFLEX TO CULTURE (INFECTION SUSPECTED)
Bacteria, UA: NONE SEEN
Bilirubin Urine: NEGATIVE
Glucose, UA: NEGATIVE mg/dL
Hgb urine dipstick: NEGATIVE
Ketones, ur: NEGATIVE mg/dL
Leukocytes,Ua: NEGATIVE
Nitrite: NEGATIVE
Protein, ur: 30 mg/dL — AB
Specific Gravity, Urine: 1.015 (ref 1.005–1.030)
pH: 5 (ref 5.0–8.0)

## 2023-05-20 LAB — BRAIN NATRIURETIC PEPTIDE: B Natriuretic Peptide: 1095 pg/mL — ABNORMAL HIGH (ref 0.0–100.0)

## 2023-05-20 LAB — CBC WITH DIFFERENTIAL/PLATELET
Abs Immature Granulocytes: 0 10*3/uL (ref 0.00–0.07)
Basophils Absolute: 0 10*3/uL (ref 0.0–0.1)
Basophils Relative: 0 %
Eosinophils Absolute: 0 10*3/uL (ref 0.0–0.5)
Eosinophils Relative: 0 %
HCT: 27.6 % — ABNORMAL LOW (ref 36.0–46.0)
Hemoglobin: 7.8 g/dL — ABNORMAL LOW (ref 12.0–15.0)
Lymphocytes Relative: 43 %
Lymphs Abs: 2.4 10*3/uL (ref 0.7–4.0)
MCH: 29.1 pg (ref 26.0–34.0)
MCHC: 28.3 g/dL — ABNORMAL LOW (ref 30.0–36.0)
MCV: 103 fL — ABNORMAL HIGH (ref 80.0–100.0)
Monocytes Absolute: 3 10*3/uL — ABNORMAL HIGH (ref 0.1–1.0)
Monocytes Relative: 54 %
Neutro Abs: 0.2 10*3/uL — CL (ref 1.7–7.7)
Neutrophils Relative %: 3 %
Platelets: 101 10*3/uL — ABNORMAL LOW (ref 150–400)
RBC: 2.68 MIL/uL — ABNORMAL LOW (ref 3.87–5.11)
RDW: 22.9 % — ABNORMAL HIGH (ref 11.5–15.5)
WBC: 5.5 10*3/uL (ref 4.0–10.5)
nRBC: 14 /100 WBC — ABNORMAL HIGH
nRBC: 23.6 % — ABNORMAL HIGH (ref 0.0–0.2)

## 2023-05-20 LAB — SODIUM
Sodium: 124 mmol/L — ABNORMAL LOW (ref 135–145)
Sodium: 127 mmol/L — ABNORMAL LOW (ref 135–145)

## 2023-05-20 LAB — BASIC METABOLIC PANEL
Anion gap: 4 — ABNORMAL LOW (ref 5–15)
Anion gap: 6 (ref 5–15)
Anion gap: 9 (ref 5–15)
BUN: 16 mg/dL (ref 8–23)
BUN: 16 mg/dL (ref 8–23)
BUN: 16 mg/dL (ref 8–23)
CO2: 26 mmol/L (ref 22–32)
CO2: 25 mmol/L (ref 22–32)
CO2: 21 mmol/L — ABNORMAL LOW (ref 22–32)
Calcium: 8.1 mg/dL — ABNORMAL LOW (ref 8.9–10.3)
Calcium: 8.1 mg/dL — ABNORMAL LOW (ref 8.9–10.3)
Calcium: 8.2 mg/dL — ABNORMAL LOW (ref 8.9–10.3)
Chloride: 93 mmol/L — ABNORMAL LOW (ref 98–111)
Chloride: 94 mmol/L — ABNORMAL LOW (ref 98–111)
Chloride: 96 mmol/L — ABNORMAL LOW (ref 98–111)
Creatinine, Ser: 1.19 mg/dL — ABNORMAL HIGH (ref 0.44–1.00)
Creatinine, Ser: 1.35 mg/dL — ABNORMAL HIGH (ref 0.44–1.00)
Creatinine, Ser: 1.39 mg/dL — ABNORMAL HIGH (ref 0.44–1.00)
GFR, Estimated: 44 mL/min — ABNORMAL LOW (ref >60)
GFR, Estimated: 38 mL/min — ABNORMAL LOW (ref >60)
GFR, Estimated: 37 mL/min — ABNORMAL LOW (ref >60)
Glucose, Bld: 96 mg/dL (ref 70–99)
Glucose, Bld: 96 mg/dL (ref 70–99)
Glucose, Bld: 99 mg/dL (ref 70–99)
Potassium: 5.9 mmol/L — ABNORMAL HIGH (ref 3.5–5.1)
Potassium: 6.7 mmol/L (ref 3.5–5.1)
Potassium: 6 mmol/L — ABNORMAL HIGH (ref 3.5–5.1)
Sodium: 123 mmol/L — ABNORMAL LOW (ref 135–145)
Sodium: 125 mmol/L — ABNORMAL LOW (ref 135–145)
Sodium: 126 mmol/L — ABNORMAL LOW (ref 135–145)

## 2023-05-20 LAB — BLOOD GAS, VENOUS
Acid-Base Excess: 0 mmol/L (ref 0.0–2.0)
Acid-base deficit: 2.1 mmol/L — ABNORMAL HIGH (ref 0.0–2.0)
Bicarbonate: 26.8 mmol/L (ref 20.0–28.0)
Bicarbonate: 30 mmol/L — ABNORMAL HIGH (ref 20.0–28.0)
Drawn by: 7836
Drawn by: 41882
O2 Saturation: 66.7 %
O2 Saturation: 34.4 %
Patient temperature: 38.5
Patient temperature: 36.2
pCO2, Ven: 65 mmHg — ABNORMAL HIGH (ref 44–60)
pCO2, Ven: 72 mmHg (ref 44–60)
pH, Ven: 7.23 — ABNORMAL LOW (ref 7.25–7.43)
pH, Ven: 7.22 — ABNORMAL LOW (ref 7.25–7.43)
pO2, Ven: 46 mmHg — ABNORMAL HIGH (ref 32–45)
pO2, Ven: <31 mmHg — CL (ref 32–45)

## 2023-05-20 LAB — GLUCOSE, CAPILLARY: Glucose-Capillary: 97 mg/dL (ref 70–99)

## 2023-05-20 LAB — OSMOLALITY: Osmolality: 275 mOsm/kg (ref 275–295)

## 2023-05-20 LAB — ECHOCARDIOGRAM LIMITED
AV Peak grad: 36 mmHg
Ao pk vel: 3 m/s
Area-P 1/2: 3.77 cm2
Height: 63 in
MV M vel: 5.32 m/s
MV Peak grad: 113.2 mmHg
S' Lateral: 2.4 cm
Weight: 2656 oz

## 2023-05-20 LAB — LACTIC ACID, PLASMA: Lactic Acid, Venous: 1.5 mmol/L (ref 0.5–1.9)

## 2023-05-20 LAB — NA AND K (SODIUM & POTASSIUM), RAND UR
Potassium Urine: 18 mmol/L
Sodium, Ur: 75 mmol/L

## 2023-05-20 LAB — PROCALCITONIN: Procalcitonin: 0.16 ng/mL

## 2023-05-20 LAB — PROTIME-INR
INR: 1.1 (ref 0.8–1.2)
Prothrombin Time: 14.8 seconds (ref 11.4–15.2)

## 2023-05-20 LAB — APTT: aPTT: 34 seconds (ref 24–36)

## 2023-05-20 LAB — CORTISOL-AM, BLOOD: Cortisol - AM: 10.4 ug/dL (ref 6.7–22.6)

## 2023-05-20 LAB — MRSA NEXT GEN BY PCR, NASAL: MRSA by PCR Next Gen: NOT DETECTED

## 2023-05-20 LAB — TSH: TSH: 4.412 u[IU]/mL (ref 0.350–4.500)

## 2023-05-20 LAB — TROPONIN I (HIGH SENSITIVITY): Troponin I (High Sensitivity): 19 ng/L — ABNORMAL HIGH (ref <18)

## 2023-05-20 LAB — AMMONIA: Ammonia: 30 umol/L (ref 9–35)

## 2023-05-20 MED ORDER — METHYLPREDNISOLONE SODIUM SUCC 125 MG IJ SOLR: 80.0000 mg | Freq: Two times a day (BID) | INTRAMUSCULAR | Status: DC

## 2023-05-20 MED ORDER — VANCOMYCIN HCL IN DEXTROSE 1-5 GM/200ML-% IV SOLN: 1000.0000 mg | Freq: Once | INTRAVENOUS | Status: DC

## 2023-05-20 MED ORDER — FUROSEMIDE 10 MG/ML IJ SOLN
40.0000 mg | Freq: Two times a day (BID) | INTRAMUSCULAR | Status: DC
Start: 2023-05-20 — End: 2023-05-20

## 2023-05-20 MED ORDER — SODIUM CHLORIDE 0.9 % IV SOLN
500.0000 mg | INTRAVENOUS | Status: DC
  Administered 2023-05-21 – 2023-05-23 (×3): 500 mg via INTRAVENOUS
  Filled 2023-05-20 (×3): qty 5

## 2023-05-20 MED ORDER — ONDANSETRON HCL 4 MG/2ML IJ SOLN: 4.0000 mg | Freq: Four times a day (QID) | INTRAMUSCULAR | Status: DC | PRN

## 2023-05-20 MED ORDER — ARFORMOTEROL TARTRATE 15 MCG/2ML IN NEBU
15.0000 ug | INHALATION_SOLUTION | Freq: Two times a day (BID) | RESPIRATORY_TRACT | Status: DC
  Administered 2023-05-20 – 2023-05-22 (×5): 15 ug via RESPIRATORY_TRACT
  Filled 2023-05-20 (×5): qty 2

## 2023-05-20 MED ORDER — DEXTROSE 50 % IV SOLN
1.0000 | Freq: Once | INTRAVENOUS | Status: AC
  Administered 2023-05-20: 50 mL via INTRAVENOUS
  Filled 2023-05-20: qty 50

## 2023-05-20 MED ORDER — ALBUTEROL SULFATE (2.5 MG/3ML) 0.083% IN NEBU
10.0000 mg | INHALATION_SOLUTION | Freq: Once | RESPIRATORY_TRACT | Status: AC
  Administered 2023-05-20: 10 mg via RESPIRATORY_TRACT
  Filled 2023-05-20: qty 12

## 2023-05-20 MED ORDER — ACETAMINOPHEN 500 MG PO TABS
1000.0000 mg | ORAL_TABLET | Freq: Once | ORAL | Status: AC
  Administered 2023-05-20: 1000 mg via ORAL
  Filled 2023-05-20: qty 2

## 2023-05-20 MED ORDER — BUDESONIDE 0.5 MG/2ML IN SUSP
0.5000 mg | Freq: Two times a day (BID) | RESPIRATORY_TRACT | Status: DC
  Administered 2023-05-20 – 2023-05-26 (×12): 0.5 mg via RESPIRATORY_TRACT
  Filled 2023-05-20 (×12): qty 2

## 2023-05-20 MED ORDER — FENTANYL CITRATE PF 50 MCG/ML IJ SOSY: 12.5000 ug | PREFILLED_SYRINGE | INTRAMUSCULAR | Status: DC | PRN

## 2023-05-20 MED ORDER — PRAVASTATIN SODIUM 10 MG PO TABS
10.0000 mg | ORAL_TABLET | Freq: Every evening | ORAL | Status: DC
  Administered 2023-05-22 – 2023-05-27 (×6): 10 mg via ORAL
  Filled 2023-05-20 (×7): qty 1

## 2023-05-20 MED ORDER — SODIUM ZIRCONIUM CYCLOSILICATE 5 G PO PACK
5.0000 g | PACK | Freq: Once | ORAL | Status: AC
  Administered 2023-05-20: 5 g via ORAL
  Filled 2023-05-20: qty 1

## 2023-05-20 MED ORDER — SODIUM CHLORIDE 0.9 % IV SOLN
2.0000 g | Freq: Once | INTRAVENOUS | Status: AC
  Administered 2023-05-20: 2 g via INTRAVENOUS
  Filled 2023-05-20: qty 12.5

## 2023-05-20 MED ORDER — VANCOMYCIN HCL 1750 MG/350ML IV SOLN
1750.0000 mg | Freq: Once | INTRAVENOUS | Status: AC
  Administered 2023-05-20: 1750 mg via INTRAVENOUS
  Filled 2023-05-20: qty 350

## 2023-05-20 MED ORDER — BISACODYL 5 MG PO TBEC: 5.0000 mg | DELAYED_RELEASE_TABLET | Freq: Every day | ORAL | Status: DC | PRN

## 2023-05-20 MED ORDER — METRONIDAZOLE 500 MG/100ML IV SOLN
500.0000 mg | Freq: Once | INTRAVENOUS | Status: AC
  Administered 2023-05-20: 500 mg via INTRAVENOUS
  Filled 2023-05-20: qty 100

## 2023-05-20 MED ORDER — REVEFENACIN 175 MCG/3ML IN SOLN: 175.0000 ug | Freq: Every day | RESPIRATORY_TRACT | Status: DC

## 2023-05-20 MED ORDER — FUROSEMIDE 10 MG/ML IJ SOLN
40.0000 mg | Freq: Once | INTRAMUSCULAR | Status: AC
  Administered 2023-05-20: 40 mg via INTRAVENOUS
  Filled 2023-05-20: qty 4

## 2023-05-20 MED ORDER — REVEFENACIN 175 MCG/3ML IN SOLN
175.0000 ug | Freq: Every day | RESPIRATORY_TRACT | Status: DC
  Administered 2023-05-21 – 2023-05-22 (×2): 175 ug via RESPIRATORY_TRACT
  Filled 2023-05-20 (×2): qty 3

## 2023-05-20 MED ORDER — CALCIUM GLUCONATE-NACL 1-0.675 GM/50ML-% IV SOLN
1.0000 g | Freq: Once | INTRAVENOUS | Status: AC
  Administered 2023-05-20: 1000 mg via INTRAVENOUS
  Filled 2023-05-20: qty 50

## 2023-05-20 MED ORDER — LACTATED RINGERS IV BOLUS (SEPSIS)
1000.0000 mL | Freq: Once | INTRAVENOUS | Status: AC
  Administered 2023-05-20: 1000 mL via INTRAVENOUS

## 2023-05-20 MED ORDER — PANTOPRAZOLE SODIUM 40 MG IV SOLR
40.0000 mg | INTRAVENOUS | Status: DC
  Administered 2023-05-20 – 2023-05-23 (×4): 40 mg via INTRAVENOUS
  Filled 2023-05-20 (×4): qty 10

## 2023-05-20 MED ORDER — SODIUM CHLORIDE 0.9 % IV SOLN
2.0000 g | INTRAVENOUS | Status: DC
  Administered 2023-05-21 – 2023-05-23 (×3): 2 g via INTRAVENOUS
  Filled 2023-05-20 (×3): qty 20

## 2023-05-20 MED ORDER — ACETAMINOPHEN 325 MG PO TABS
650.0000 mg | ORAL_TABLET | Freq: Four times a day (QID) | ORAL | Status: DC | PRN
  Administered 2023-05-24: 650 mg via ORAL
  Filled 2023-05-20: qty 2

## 2023-05-20 MED ORDER — TRAZODONE HCL 50 MG PO TABS: 25.0000 mg | ORAL_TABLET | Freq: Every evening | ORAL | Status: DC | PRN

## 2023-05-20 MED ORDER — IPRATROPIUM-ALBUTEROL 0.5-2.5 (3) MG/3ML IN SOLN
3.0000 mL | Freq: Three times a day (TID) | RESPIRATORY_TRACT | Status: DC
  Administered 2023-05-20: 3 mL via RESPIRATORY_TRACT
  Filled 2023-05-20: qty 3

## 2023-05-20 MED ORDER — ISOSORBIDE MONONITRATE ER 30 MG PO TB24
30.0000 mg | ORAL_TABLET | Freq: Every day | ORAL | Status: DC
  Administered 2023-05-20 – 2023-05-25 (×6): 30 mg via ORAL
  Filled 2023-05-20 (×6): qty 1

## 2023-05-20 MED ORDER — METHYLPREDNISOLONE SODIUM SUCC 40 MG IJ SOLR
40.0000 mg | Freq: Two times a day (BID) | INTRAMUSCULAR | Status: DC
  Administered 2023-05-20 – 2023-05-23 (×6): 40 mg via INTRAVENOUS
  Filled 2023-05-20 (×6): qty 1

## 2023-05-20 MED ORDER — OXYCODONE HCL 5 MG PO TABS: 5.0000 mg | ORAL_TABLET | Freq: Four times a day (QID) | ORAL | Status: DC | PRN

## 2023-05-20 MED ORDER — ONDANSETRON HCL 4 MG PO TABS: 4.0000 mg | ORAL_TABLET | Freq: Four times a day (QID) | ORAL | Status: DC | PRN

## 2023-05-20 MED ORDER — ARFORMOTEROL TARTRATE 15 MCG/2ML IN NEBU: 15.0000 ug | INHALATION_SOLUTION | Freq: Two times a day (BID) | RESPIRATORY_TRACT | Status: DC

## 2023-05-20 MED ORDER — VANCOMYCIN HCL 1500 MG/300ML IV SOLN: 1500.0000 mg | Freq: Once | INTRAVENOUS | Status: DC

## 2023-05-20 MED ORDER — SODIUM ZIRCONIUM CYCLOSILICATE 10 G PO PACK
10.0000 g | PACK | Freq: Every day | ORAL | Status: DC
  Administered 2023-05-21: 10 g via ORAL
  Filled 2023-05-20: qty 1

## 2023-05-20 MED ORDER — FUROSEMIDE 10 MG/ML IJ SOLN
40.0000 mg | Freq: Two times a day (BID) | INTRAMUSCULAR | Status: AC
  Administered 2023-05-20 – 2023-05-21 (×2): 40 mg via INTRAVENOUS
  Filled 2023-05-20 (×2): qty 4

## 2023-05-20 MED ORDER — BUDESONIDE 0.25 MG/2ML IN SUSP: 0.2500 mg | Freq: Two times a day (BID) | RESPIRATORY_TRACT | Status: DC

## 2023-05-20 MED ORDER — SODIUM BICARBONATE 8.4 % IV SOLN
50.0000 meq | Freq: Once | INTRAVENOUS | Status: AC
  Administered 2023-05-20: 50 meq via INTRAVENOUS
  Filled 2023-05-20: qty 50

## 2023-05-20 MED ORDER — LACTATED RINGERS IV SOLN: INTRAVENOUS | Status: DC

## 2023-05-20 MED ORDER — LEVOTHYROXINE SODIUM 100 MCG PO TABS
100.0000 ug | ORAL_TABLET | Freq: Every day | ORAL | Status: DC
  Administered 2023-05-20 – 2023-05-28 (×9): 100 ug via ORAL
  Filled 2023-05-20 (×9): qty 1

## 2023-05-20 MED ORDER — LACTATED RINGERS IV BOLUS (SEPSIS): 500.0000 mL | Freq: Once | INTRAVENOUS | Status: DC

## 2023-05-20 MED ORDER — PANTOPRAZOLE SODIUM 40 MG PO TBEC
40.0000 mg | DELAYED_RELEASE_TABLET | Freq: Two times a day (BID) | ORAL | Status: DC
  Administered 2023-05-20: 40 mg via ORAL
  Filled 2023-05-20: qty 1

## 2023-05-20 MED ORDER — ACETAMINOPHEN 650 MG RE SUPP: 650.0000 mg | Freq: Four times a day (QID) | RECTAL | Status: DC | PRN

## 2023-05-20 MED ORDER — CHLORHEXIDINE GLUCONATE CLOTH 2 % EX PADS
6.0000 | MEDICATED_PAD | Freq: Every day | CUTANEOUS | Status: DC
  Administered 2023-05-20 – 2023-05-23 (×3): 6 via TOPICAL

## 2023-05-20 MED ORDER — HEPARIN SODIUM (PORCINE) 5000 UNIT/ML IJ SOLN
5000.0000 [IU] | Freq: Three times a day (TID) | INTRAMUSCULAR | Status: DC
  Administered 2023-05-20 – 2023-05-23 (×10): 5000 [IU] via SUBCUTANEOUS
  Filled 2023-05-20 (×9): qty 1

## 2023-05-20 MED ORDER — IPRATROPIUM-ALBUTEROL 0.5-2.5 (3) MG/3ML IN SOLN
3.0000 mL | Freq: Once | RESPIRATORY_TRACT | Status: AC
  Administered 2023-05-20: 3 mL via RESPIRATORY_TRACT
  Filled 2023-05-20: qty 3

## 2023-05-20 MED ORDER — FLUTICASONE FUROATE-VILANTEROL 100-25 MCG/ACT IN AEPB: 1.0000 | INHALATION_SPRAY | Freq: Every day | RESPIRATORY_TRACT | Status: DC

## 2023-05-20 MED ORDER — SODIUM CHLORIDE 0.9 % IV SOLN: Freq: Once | INTRAVENOUS | Status: AC

## 2023-05-20 MED ORDER — INSULIN ASPART 100 UNIT/ML IV SOLN
5.0000 [IU] | Freq: Once | INTRAVENOUS | Status: AC
  Administered 2023-05-20: 5 [IU] via INTRAVENOUS

## 2023-05-20 MED ORDER — UMECLIDINIUM BROMIDE 62.5 MCG/ACT IN AEPB: 1.0000 | INHALATION_SPRAY | Freq: Every day | RESPIRATORY_TRACT | Status: DC

## 2023-05-20 MED ORDER — LORAZEPAM 2 MG/ML IJ SOLN
0.5000 mg | INTRAMUSCULAR | Status: DC | PRN
  Filled 2023-05-20: qty 1

## 2023-05-20 MED ORDER — METOPROLOL SUCCINATE ER 25 MG PO TB24
25.0000 mg | ORAL_TABLET | Freq: Every day | ORAL | Status: DC
  Administered 2023-05-20 – 2023-05-28 (×9): 25 mg via ORAL
  Filled 2023-05-20 (×9): qty 1

## 2023-05-20 MED ORDER — IPRATROPIUM-ALBUTEROL 0.5-2.5 (3) MG/3ML IN SOLN
3.0000 mL | RESPIRATORY_TRACT | Status: DC | PRN
  Administered 2023-05-21: 3 mL via RESPIRATORY_TRACT
  Filled 2023-05-20 (×2): qty 3

## 2023-05-20 NOTE — Consult Note (Signed)
Reason for Consult: Hyponatremia Referring Physician: Laural Benes, MD  Lisa [name] is an 89 y.o. female has a PMH significant for breast cancer stage I (treated with lumpectomy and XRT in 1999), LUL lung cancer stage I s/p lobectomy 2006, MDS/CMML c/b pancytopenia, COPD, CAD s/p NSTEMI (04/06/23), paroxysmal atrial fibrillation, HTN, HFpEF, hyperkalemia, and CKD stage IIIa who presented to [facility] ED via EMS on 05/20/23 with AMS and weakness.  Family reports that over the past couple of days she has had increased weakness and fatigue.  Also increased cough.  She denies any fevers, chills, nausea, vomiting, or diarrhea.  No new medications.  EMS noted that she was hypoxic with SpO2 of 84% and placed on NRB.  In the ED, Temp 98.3, Bp 115/57, HR 71, RR 18, SpO2 96%.  Labs were notable for Na 122, K 6.5, Cl 92, Cr 1.3, Ca 8.3, alb 3.2, AST 70, ALT 52, WBC 5.5, Hgb 7.8, plt 101, BNP 1095.  CXR with interstitial pulmonary infiltrate with possible CHF.  Small bilateral pleural effusions.  She was given IV Calcium, Lokelma and received 2 boluses of lactated ringers in the ED.  She was admitted under sepsis protocol and we were consulted to further evaluate her hyponatremia.  The trend is seen below.  She has had several episodes of hyponatremia over the past 3 years.  Trend in Serum Sodium: Sodium  Date/Time Value Ref Range Status  05/20/2023 02:45 AM 122 (L) 135 - 145 mmol/L Final  04/14/2023 06:16 PM 135 135 - 145 mmol/L Final  04/09/2023 04:26 AM 136 135 - 145 mmol/L Final  04/08/2023 04:15 AM 132 (L) 135 - 145 mmol/L Final  04/06/2023 12:05 PM 128 (L) 135 - 145 mmol/L Final  04/01/2023 09:01 AM 132 (L) 135 - 145 mmol/L Final  02/12/2023 08:02 AM 134 (L) 135 - 145 mmol/L Final  11/07/2022 05:38 AM 134 (L) 135 - 145 mmol/L Final  11/06/2022 03:23 AM 134 (L) 135 - 145 mmol/L Final  11/05/2022 05:07 AM 128 (L) 135 - 145 mmol/L Final  11/04/2022 03:54 PM 126 (L) 135 - 145 mmol/L Final  11/04/2022 05:11 AM 131 (L)  135 - 145 mmol/L Final  11/03/2022 11:32 AM 126 (L) 135 - 145 mmol/L Final  11/03/2022 07:44 AM 124 (L) 135 - 145 mmol/L Final  08/27/2022 10:29 AM 136 135 - 145 mmol/L Final  02/11/2022 05:05 AM 135 135 - 145 mmol/L Final  02/10/2022 02:33 PM 137 135 - 145 mmol/L Final  08/28/2021 04:36 AM 125 (L) 135 - 145 mmol/L Final  08/27/2021 04:40 AM 126 (L) 135 - 145 mmol/L Final  08/27/2021 12:25 AM 124 (L) 135 - 145 mmol/L Final  08/26/2021 12:52 PM 119 (LL) 135 - 145 mmol/L Final  08/22/2021 10:02 AM 134 (L) 135 - 145 mmol/L Final  06/25/2021 11:54 AM 133 (L) 135 - 145 mmol/L Final  06/11/2021 12:15 PM 134 (L) 135 - 145 mmol/L Final  05/28/2021 10:51 AM 127 (L) 135 - 145 mmol/L Final  05/14/2021 02:46 PM 131 (L) 135 - 145 mmol/L Final  04/16/2021 03:22 PM 130 (L) 135 - 145 mmol/L Final  03/30/2021 10:45 PM 127 (L) 135 - 145 mmol/L Final  02/26/2021 12:17 PM 132 (L) 135 - 145 mmol/L Final  01/13/2021 10:47 AM 130 (L) 135 - 145 mmol/L Final  12/12/2020 09:09 AM 127 (L) 135 - 145 mmol/L Final  12/06/2020 10:19 AM 130 (L) 135 - 145 mmol/L Final  11/29/2020 02:40 PM 127 (L) 135 - 145 mmol/L  Final  11/19/2020 03:35 PM 131 (L) 135 - 145 mmol/L Final  11/13/2020 06:29 AM 131 (L) 135 - 145 mmol/L Final  11/11/2020 05:52 AM 134 (L) 135 - 145 mmol/L Final  11/09/2020 05:03 AM 133 (L) 135 - 145 mmol/L Final  11/08/2020 05:02 AM 136 135 - 145 mmol/L Final  11/07/2020 04:56 AM 135 135 - 145 mmol/L Final  11/06/2020 05:55 AM 135 135 - 145 mmol/L Final  11/05/2020 05:14 AM 135 135 - 145 mmol/L Final  11/04/2020 06:21 AM 132 (L) 135 - 145 mmol/L Final  10/31/2020 05:09 AM 134 (L) 135 - 145 mmol/L Final  10/30/2020 03:03 AM 132 (L) 135 - 145 mmol/L Final  10/29/2020 03:12 AM 130 (L) 135 - 145 mmol/L Final  10/28/2020 01:18 AM 128 (L) 135 - 145 mmol/L Final  10/27/2020 09:33 AM 124 (L) 135 - 145 mmol/L Final  10/27/2020 01:51 AM 119 (LL) 135 - 145 mmol/L Final  10/26/2020 01:50 PM 119 (LL) 135 - 145  mmol/L Final  10/26/2020 02:40 AM 118 (LL) 135 - 145 mmol/L Final  10/25/2020 03:25 AM 121 (L) 135 - 145 mmol/L Final  10/24/2020 02:24 AM 129 (L) 135 - 145 mmol/L Final    PMH:   Past Medical History:  Diagnosis Date   Adenocarcinoma of left lung (HCC) 2006   Arthritis    Asthma    Cancer of breast, female (HCC)    Cancer of lung (HCC)    Dysrhythmia    Hypertension    Hypothyroidism    Invasive ductal carcinoma of left breast (HCC) 1999   Myocardial infarction (HCC)    Neutropenia (HCC) 06/09/2016   Personal history of radiation therapy     PSH:   Past Surgical History:  Procedure Laterality Date   BIOPSY  10/23/2020   Procedure: BIOPSY;  Surgeon: Benancio Deeds, MD;  Location: [facility] ENDOSCOPY;  Service: Gastroenterology;;   BREAST BIOPSY     left axillary node dissection   BREAST LUMPECTOMY Left    COLONOSCOPY  03/08/2003   RUE:[id number] rectal polyps destroyed with the tip of the snare/Polyps at hepatic flexure, splenic flexure at 35 cm/Left-sided diverticula: unable to retrieve path   COLONOSCOPY  09/12/2008   YNW:GNFAOZ rectum and distal sigmoid diminutive polyps/scattered left sided diverticulum. hyperplastic   COLONOSCOPY N/A 03/06/2013   HYQ:MVHQION polyp-removed as described above; colonic diverticulosis. hyperplastic polyps. next TCS 03/2018   ESOPHAGOGASTRODUODENOSCOPY (EGD) WITH PROPOFOL N/A 10/23/2020   Procedure: ESOPHAGOGASTRODUODENOSCOPY (EGD) WITH PROPOFOL;  Surgeon: Benancio Deeds, MD;  Location: [facility] ENDOSCOPY;  Service: Gastroenterology;  Laterality: N/A;   FOOT SURGERY     LUNG REMOVAL, PARTIAL     upper lobe    Allergies:  Allergies  Allergen Reactions   Meloxicam Other (See Comments)    Caused an injury to the kidneys, per nephrologist   Micardis Hct [Telmisartan-Hctz] Other (See Comments)    Caused an injury to the kidneys, per nephrologist    Medications:   Prior to Admission medications   Medication Sig Start Date End Date Taking?  Authorizing Provider  acetaminophen (TYLENOL) 325 MG tablet Take 2 tablets (650 mg total) by mouth every 6 (six) hours. 11/11/20  Yes Angiulli, Mcarthur Rossetti, PA-C  albuterol (VENTOLIN HFA) 108 (90 Base) MCG/ACT inhaler Inhale 1-2 puffs into the lungs every 4 (four) hours as needed for wheezing or shortness of breath. 05/25/22  Yes [provider]  Fluticasone-Umeclidin-Vilant (TRELEGY ELLIPTA) 100-62.5-25 MCG/ACT AEPB Inhale 1 puff into the lungs daily. 12/18/22  Yes Oretha Milch, MD  furosemide (LASIX) 20 MG tablet Take 1 tablet (20 mg total) by mouth daily. 04/09/23  Yes Johnson, Clanford L, MD  gabapentin (NEURONTIN) 100 MG capsule Take 1 capsule (100 mg total) by mouth every 12 (twelve) hours as needed (neuropathy). Patient taking differently: Take 100 mg by mouth 3 (three) times daily as needed (neuropathy). 02/11/22 05/20/23 Yes Vassie Loll, MD  isosorbide mononitrate (IMDUR) 30 MG 24 hr tablet Take 1 tablet (30 mg total) by mouth daily. 04/10/23  Yes Johnson, Clanford L, MD  levothyroxine (SYNTHROID) 100 MCG tablet Take 1 tablet (100 mcg total) by mouth daily before breakfast. 08/28/21  Yes Johnson, Clanford L, MD  LOKELMA 10 g PACK packet Take 10 g by mouth every other day. 04/12/23  Yes Johnson, Clanford L, MD  magnesium oxide (MAG-OX) 400 MG tablet Take 400 mg by mouth 2 (two) times daily.   Yes [provider]  metoprolol succinate (TOPROL-XL) 25 MG 24 hr tablet TAKE 1 TABLET DAILY 02/18/23  Yes Mallipeddi, Vishnu P, MD  mirtazapine (REMERON) 7.5 MG tablet Take 1 tablet (7.5 mg total) by mouth at bedtime. Patient taking differently: Take 15 mg by mouth at bedtime. 05/28/21  Yes Artis Delay, MD  Multiple Vitamin (MULTI-VITAMIN) tablet Take 1 tablet by mouth daily.   Yes [provider]  pantoprazole (PROTONIX) 40 MG tablet Take 1 tablet (40 mg total) by mouth 2 (two) times daily. 04/09/23  Yes Johnson, Clanford L, MD  pravastatin (PRAVACHOL) 10 MG tablet Take 1 tablet (10 mg  total) by mouth every evening. 04/09/23  Yes Johnson, Clanford L, MD    Discontinued Meds:   Medications Discontinued During This Encounter  Medication Reason   vancomycin (VANCOCIN) IVPB 1000 mg/200 mL premix Dose change   vancomycin (VANCOREADY) IVPB 1500 mg/300 mL Reorder   vancomycin (VANCOCIN) IVPB 1000 mg/200 mL premix    lactated ringers infusion    lactated ringers bolus 500 mL     Social History:  reports that she quit smoking about 31 years ago. Her smoking use included cigarettes. She started smoking about 66 years ago. She has a 26.3 pack-year smoking history. She has never used smokeless tobacco. She reports that she does not drink alcohol and does not use drugs.  Family History:   Family History  Problem Relation Age of Onset   Cancer Mother    Cancer Sister    Colon cancer Neg Hx     Pertinent items are noted in HPI.  Blood pressure (!) 111/57, pulse 95, temperature (!) 97.2 F (36.2 C), temperature source Oral, resp. rate (!) 32, height 5\' 3"  (1.6 m), weight 75.3 kg, SpO2 100%. General appearance: fatigued and no distress Head: Normocephalic, without obvious abnormality, atraumatic Resp: wheezes bilaterally Cardio: regular rate and rhythm, S1, S2 normal, no murmur, click, rub or gallop GI: soft, non-tender; bowel sounds normal; no masses,  no organomegaly Extremities: extremities normal, atraumatic, no cyanosis or edema  Labs: Basic Metabolic Panel: Recent Labs  Lab 05/20/23 0245  NA 122*  K 6.5*  CL 92*  CO2 25  GLUCOSE 111*  BUN 15  CREATININE 1.30*  ALBUMIN 3.2*  CALCIUM 8.3*   Liver Function Tests: Recent Labs  Lab 05/20/23 0245  AST 70*  ALT 52*  ALKPHOS 59  BILITOT 0.8  PROT 8.2*  ALBUMIN 3.2*   No results for input(s): "LIPASE", "AMYLASE" in the last 168 hours. Recent Labs  Lab 05/20/23 0246  AMMONIA 30   CBC:  Recent Labs  Lab 05/20/23 0245  WBC 5.5  NEUTROABS 0.2*  HGB 7.8*  HCT 27.6*  MCV 103.0*  PLT 101*    PT/INR: @labrcntip (inr:5) Cardiac Enzymes: No results for input(s): "CKTOTAL", "CKMB", "CKMBINDEX", "TROPONINI" in the last 168 hours. CBG: No results for input(s): "GLUCAP" in the last 168 hours.  Iron Studies: No results for input(s): "IRON", "TIBC", "TRANSFERRIN", "FERRITIN" in the last 168 hours.  Xrays/Other Studies: DG Chest Port 1 View  Result Date: 05/20/2023 CLINICAL DATA:  Sepsis EXAM: PORTABLE CHEST 1 VIEW COMPARISON:  04/06/2023 FINDINGS: Symmetric pulmonary insufflation. Moderate perihilar and lower lung zone interstitial pulmonary infiltrate is present most in keeping with moderate cardiogenic failure. Small bilateral, left greater than right, pleural effusions are present. No pneumothorax. Cardiac size is enlarged, unchanged. No acute bone abnormality. IMPRESSION: 1. Moderate cardiogenic failure. Electronically Signed   By: Helyn Numbers M.D.   On: 05/20/2023 03:35   CT Head Wo Contrast  Result Date: 05/20/2023 CLINICAL DATA:  Mental status change, unknown cause EXAM: CT HEAD WITHOUT CONTRAST TECHNIQUE: Contiguous axial images were obtained from the base of the skull through the vertex without intravenous contrast. RADIATION DOSE REDUCTION: This exam was performed according to the departmental dose-optimization program which includes automated exposure control, adjustment of the mA and/or kV according to patient size and/or use of iterative reconstruction technique. COMPARISON:  04/14/2023 FINDINGS: Brain: Normal anatomic configuration. Parenchymal volume loss is commensurate with the patient's age. Stable moderate periventricular white matter changes are present likely reflecting the sequela of small vessel ischemia. No abnormal intra or extra-axial mass lesion or fluid collection. No abnormal mass effect or midline shift. No evidence of acute intracranial hemorrhage or infarct. Ventricular size is normal. Cerebellum unremarkable. Vascular: No asymmetric hyperdense vasculature at  the skull base. Skull: Intact Sinuses/Orbits: Paranasal sinuses are clear. Orbits are unremarkable. Other: Mastoid air cells and middle ear cavities are clear. IMPRESSION: 1. No acute intracranial hemorrhage or infarct. 2. Stable senescent change. Electronically Signed   By: Helyn Numbers M.D.   On: 05/20/2023 03:26     Assessment/Plan:  Hyponatremia - CXR consistent with CHF although pneumonia could also cause hyponatremia with hypoxia.  Awaiting Sosm, Uosm, and UNa.  No history of N/V/D.  No edema on exam.  Will need to recheck sodium level since she received several boluses of LR and is now on NS.  Also question po intake prior to admission.  Difficult to pinpoint exact cause of hyponatremia at this time.  No SSRI.  Awaiting lab results.  Currently asymptomatic.  Continue to follow closely.   Ordered cortisol and TSH this am.   Acute on chronic hypoxic and hypercapnic respiratory failure - possible COPD exacerbation vs CHF.  Given IVF's without worsening respiratory status.  Treated with nebulizer in ED.  Plan per primary svc. Hyperkalemia - recurrent issue and was discharged on [facility] after her last admission, however not sure she has been taking it.  Repeat K level and continue with Lokelma 10 gm daily.  ECG without peaked t-waves. AKI/CKD stage IIIa - Scr up slightly and likely due to poor po intake.  Will recheck after IVF's.   MDS/CMML - recent bone marrow biopsy with 3% blasts.  Followed by hematology and is on Aranesp as an outpatient.   HTN - bp on low side.  Hold metoprolol for now.  P A-fib - per primary.  COPD - per primary.  Abnormal LFT's- need to follow and consider hepatitis panel.    Julien Nordmann  Stefani Baik 05/20/2023, 10:01 AM

## 2023-05-20 NOTE — Progress Notes (Signed)
ABG collected.  Results   Latest Reference Range & Units 05/20/23 22:15  pH, Arterial 7.35 - 7.45  7.25 (L)  pCO2 arterial 32 - 48 mmHg 62 (H)  pO2, Arterial 83 - 108 mmHg 87  Acid-base deficit 0.0 - 2.0 mmol/L 0.1  Bicarbonate 20.0 - 28.0 mmol/L 27.6  (L): Data is abnormally low (H): Data is abnormally high  Patient's pH is trending back up.  Previous was 7.22.  PaCO2 previous was 69 and now is 62.  PaO2 previous was 81 and is now 87.  RN aware of result.

## 2023-05-20 NOTE — ED Triage Notes (Signed)
Pt presents with altered mental status that started tonight. Pt usually on 2L O2 and in truck sats dropped to 84% and placed on NRB. No complaints. Pt seems to be alert and orientated x3

## 2023-05-20 NOTE — Plan of Care (Signed)
  Problem: Education: Goal: Knowledge of General Education information will improve Description: Including pain rating scale, medication(s)/side effects and non-pharmacologic comfort measures Outcome: Not Progressing   Problem: Health Behavior/Discharge Planning: Goal: Ability to manage health-related needs will improve Outcome: Not Progressing   Problem: Clinical Measurements: Goal: Ability to maintain clinical measurements within normal limits will improve Outcome: Not Progressing   Problem: Clinical Measurements: Goal: Will remain free from infection Outcome: Not Progressing   Problem: Clinical Measurements: Goal: Diagnostic test results will improve Outcome: Not Progressing   Problem: Clinical Measurements: Goal: Respiratory complications will improve Outcome: Not Progressing   Problem: Activity: Goal: Risk for activity intolerance will decrease Outcome: Not Progressing   Problem: Nutrition: Goal: Adequate nutrition will be maintained Outcome: Not Progressing   Problem: Coping: Goal: Level of anxiety will decrease Outcome: Not Progressing

## 2023-05-20 NOTE — Progress Notes (Signed)
Critical Value received from lab at 1721  pCO2 69  Dr. Laural Benes notified at [phone]

## 2023-05-20 NOTE — Progress Notes (Signed)
eLink Physician-Brief Progress Note Patient Name: Lisa [name] DOB: [birth year] MRN: [id number]   Date of Service  05/20/2023  HPI/Events of Note  I spoke with patient's grandson who is also POA, I updated him and answered his questions, K+ 6.0 on repeat BMP, repeat ABG pending.  eICU Interventions  Will treat hyperkalemia with another round of Insulin + D 50 % water. Follow up ABG results.        Thomasene Lot [name] 05/20/2023, 9:26 PM

## 2023-05-20 NOTE — Progress Notes (Signed)
eLink Physician-Brief Progress Note Patient Name: Lisa [name] DOB: 1934-03-01 MRN: [id number]   Date of Service  05/20/2023  HPI/Events of Note  ABG reviewed.  eICU Interventions          Migdalia Dk 05/20/2023, 11:34 PM

## 2023-05-20 NOTE — Sepsis Progress Note (Signed)
Elink monitoring for the code sepsis protocol.  

## 2023-05-20 NOTE — ED Provider Notes (Signed)
[place] [facility]ERGENCY DEPARTMENT AT [facility] Provider Note   CSN: [id number] Arrival date & time: 05/20/23  0222     History  Chief Complaint  Patient presents with   Altered Mental Status    Lisa [name] is a 89 y.o. female.  History obtained from son at bedside, EMS and medical records as patient is altered.  Appears the patient had an NSTEMI recently, CHF, COPD, myelodysplastic syndrome, hypothyroidism and on 2 L nasal cannula chronically.  Sounds like over the last couple days she has had some progressively worsening weakness and undefined illness.  She just has not felt well had some changes in urination.  Increased cough but no change in her sputum production.  No fevers at home.  All culminated this evening when she was walking down the hallway and she had significant weakness to the point that she could not walk any further collapse to the floor.  Did not hit her head or pass out.  Son and daughter were able to get her up and into the bed.  An hour or 2 later she fell like she go to the bathroom but they could not even get her to sit up without significant assistance has been the decided call EMS.  On EMS arrival her vital signs were within normal limits.  They found that she did have a temperature of 100.6, I presume orally.  After moving to the ambulance the patient apparently became hypoxic on her 2 L to the 80s and they put her on a nonrebreather.  Son states that when she had the significant weakness in the hallway she did have an oxygen in the mid 24s but it resolved when she was in the bed.   Altered Mental Status      Home Medications Prior to Admission medications   Medication Sig Start Date End Date Taking? Authorizing Provider  acetaminophen (TYLENOL) 325 MG tablet Take 2 tablets (650 mg total) by mouth every 6 (six) hours. 11/11/20   Angiulli, Mcarthur Rossetti, PA-C  albuterol (VENTOLIN HFA) 108 (90 Base) MCG/ACT inhaler Inhale 1-2 puffs into the lungs every 4  (four) hours as needed for wheezing or shortness of breath. 05/25/22   [provider]  Fluticasone-Umeclidin-Vilant (TRELEGY ELLIPTA) 100-62.5-25 MCG/ACT AEPB Inhale 1 puff into the lungs daily. 12/18/22   Oretha Milch, MD  furosemide (LASIX) 20 MG tablet Take 1 tablet (20 mg total) by mouth daily. 04/09/23   Johnson, Clanford L, MD  gabapentin (NEURONTIN) 100 MG capsule Take 1 capsule (100 mg total) by mouth every 12 (twelve) hours as needed (neuropathy). 02/11/22 04/06/23  Vassie Loll, MD  isosorbide mononitrate (IMDUR) 30 MG 24 hr tablet Take 1 tablet (30 mg total) by mouth daily. 04/10/23   Cleora Fleet, MD  levothyroxine (SYNTHROID) 100 MCG tablet Take 1 tablet (100 mcg total) by mouth daily before breakfast. 08/28/21   Johnson, Clanford L, MD  LOKELMA 10 g PACK packet Take 10 g by mouth every other day. 04/12/23   Johnson, Clanford L, MD  magnesium oxide (MAG-OX) 400 MG tablet Take 400 mg by mouth 2 (two) times daily.    [provider]  metoprolol succinate (TOPROL-XL) 25 MG 24 hr tablet TAKE 1 TABLET DAILY 02/18/23   Mallipeddi, Vishnu P, MD  mirtazapine (REMERON) 7.5 MG tablet Take 1 tablet (7.5 mg total) by mouth at bedtime. 05/28/21   Artis Delay, MD  Multiple Vitamin (MULTI-VITAMIN) tablet Take 1 tablet by mouth daily.  [provider]  pantoprazole (PROTONIX) 40 MG tablet Take 1 tablet (40 mg total) by mouth 2 (two) times daily. 04/09/23   Johnson, Clanford L, MD  pravastatin (PRAVACHOL) 10 MG tablet Take 1 tablet (10 mg total) by mouth every evening. 04/09/23   Cleora Fleet, MD      Allergies    Meloxicam and Micardis hct [telmisartan-hctz]    Review of Systems   Review of Systems  Physical Exam Updated Vital Signs BP (!) 115/57 (BP Location: Right Arm)   Pulse 71   Temp 98.3 F (36.8 C) (Oral)   Resp 18   Ht 5\' 3"  (1.6 m)   Wt 75.3 kg   SpO2 96%   BMI 29.41 kg/m  Physical Exam Vitals and nursing note reviewed.  Constitutional:       Appearance: She is well-developed. She is ill-appearing.  HENT:     Head: Normocephalic and atraumatic.  Cardiovascular:     Rate and Rhythm: Normal rate and regular rhythm.     Heart sounds: Murmur heard.  Pulmonary:     Effort: Bradypnea present. No respiratory distress.     Breath sounds: Decreased air movement present. No stridor. Wheezing present.  Abdominal:     General: There is distension.     Tenderness: There is no abdominal tenderness. There is no guarding or rebound.  Musculoskeletal:     Cervical back: Normal range of motion.     Right lower leg: No edema.     Left lower leg: Edema present.  Neurological:     Mental Status: She is alert.     ED Results / Procedures / Treatments   Labs (all labs ordered are listed, but only abnormal results are displayed) Labs Reviewed  COMPREHENSIVE METABOLIC PANEL - Abnormal; Notable for the following components:      Result Value   Sodium 122 (*)    Potassium 6.5 (*)    Chloride 92 (*)    Glucose, Bld 111 (*)    Creatinine, Ser 1.30 (*)    Calcium 8.3 (*)    Total Protein 8.2 (*)    Albumin 3.2 (*)    AST 70 (*)    ALT 52 (*)    GFR, Estimated 40 (*)    All other components within normal limits  CBC WITH DIFFERENTIAL/PLATELET - Abnormal; Notable for the following components:   RBC 2.68 (*)    Hemoglobin 7.8 (*)    HCT 27.6 (*)    MCV 103.0 (*)    MCHC 28.3 (*)    RDW 22.9 (*)    Platelets 101 (*)    nRBC 23.6 (*)    Neutro Abs 0.2 (*)    Monocytes Absolute 3.0 (*)    nRBC 14 (*)    All other components within normal limits  BLOOD GAS, VENOUS - Abnormal; Notable for the following components:   pH, Ven 7.23 (*)    pCO2, Ven 65 (*)    pO2, Ven 46 (*)    Acid-base deficit 2.1 (*)    All other components within normal limits  BRAIN NATRIURETIC PEPTIDE - Abnormal; Notable for the following components:   B Natriuretic Peptide 1,095.0 (*)    All other components within normal limits  TROPONIN I (HIGH SENSITIVITY) -  Abnormal; Notable for the following components:   Troponin I (High Sensitivity) 19 (*)    All other components within normal limits  RESP PANEL BY RT-PCR (RSV, FLU A&B, COVID)  RVPGX2  CULTURE,  BLOOD (ROUTINE X 2)  CULTURE, BLOOD (ROUTINE X 2)  LACTIC ACID, PLASMA  PROTIME-INR  APTT  AMMONIA  URINALYSIS, W/ REFLEX TO CULTURE (INFECTION SUSPECTED)    EKG EKG Interpretation Date/Time:  Thursday May 20 2023 02:32:00 EDT Ventricular Rate:  74 PR Interval:  198 QRS Duration:  111 QT Interval:  353 QTC Calculation: 392 R Axis:   23  Text Interpretation: Sinus rhythm RSR' in V1 or V2, right VCD or RVH Confirmed by Marily Memos [phone]) on 05/20/2023 4:17:27 AM  Radiology DG Chest Port 1 View  Result Date: 05/20/2023 CLINICAL DATA:  Sepsis EXAM: PORTABLE CHEST 1 VIEW COMPARISON:  04/06/2023 FINDINGS: Symmetric pulmonary insufflation. Moderate perihilar and lower lung zone interstitial pulmonary infiltrate is present most in keeping with moderate cardiogenic failure. Small bilateral, left greater than right, pleural effusions are present. No pneumothorax. Cardiac size is enlarged, unchanged. No acute bone abnormality. IMPRESSION: 1. Moderate cardiogenic failure. Electronically Signed   By: Helyn Numbers M.D.   On: 05/20/2023 03:35   CT Head Wo Contrast  Result Date: 05/20/2023 CLINICAL DATA:  Mental status change, unknown cause EXAM: CT HEAD WITHOUT CONTRAST TECHNIQUE: Contiguous axial images were obtained from the base of the skull through the vertex without intravenous contrast. RADIATION DOSE REDUCTION: This exam was performed according to the departmental dose-optimization program which includes automated exposure control, adjustment of the mA and/or kV according to patient size and/or use of iterative reconstruction technique. COMPARISON:  04/14/2023 FINDINGS: Brain: Normal anatomic configuration. Parenchymal volume loss is commensurate with the patient's age. Stable moderate  periventricular white matter changes are present likely reflecting the sequela of small vessel ischemia. No abnormal intra or extra-axial mass lesion or fluid collection. No abnormal mass effect or midline shift. No evidence of acute intracranial hemorrhage or infarct. Ventricular size is normal. Cerebellum unremarkable. Vascular: No asymmetric hyperdense vasculature at the skull base. Skull: Intact Sinuses/Orbits: Paranasal sinuses are clear. Orbits are unremarkable. Other: Mastoid air cells and middle ear cavities are clear. IMPRESSION: 1. No acute intracranial hemorrhage or infarct. 2. Stable senescent change. Electronically Signed   By: Helyn Numbers M.D.   On: 05/20/2023 03:26    Procedures .1-3 Lead EKG Interpretation  Performed by: Marily Memos, MD Authorized by: Marily Memos, MD     Interpretation: normal     ECG rate assessment: normal     Rhythm: sinus rhythm     Ectopy: none     Conduction: normal   .Critical Care  Performed by: Marily Memos, MD Authorized by: Marily Memos, MD   Critical care provider statement:    Critical care time (minutes):  80   Critical care was necessary to treat or prevent imminent or life-threatening deterioration of the following conditions:  Respiratory failure, sepsis, metabolic crisis, endocrine crisis and CNS failure or compromise   Critical care was time spent personally by me on the following activities:  Development of treatment plan with patient or surrogate, discussions with consultants, evaluation of patient's response to treatment, examination of patient, ordering and review of laboratory studies, ordering and review of radiographic studies, ordering and performing treatments and interventions, pulse oximetry, re-evaluation of patient's condition and review of old charts     Medications Ordered in ED Medications  vancomycin (VANCOREADY) IVPB 1750 mg/350 mL (1,750 mg Intravenous New Bag/Given 05/20/23 0453)  lactated ringers bolus 1,000 mL  (0 mLs Intravenous Stopped 05/20/23 0419)    And  lactated ringers bolus 1,000 mL (0 mLs Intravenous Stopped 05/20/23 0419)  ceFEPIme (  MAXIPIME) 2 g in sodium chloride 0.9 % 100 mL IVPB (0 g Intravenous Stopped 05/20/23 0400)  metroNIDAZOLE (FLAGYL) IVPB 500 mg (0 mg Intravenous Stopped 05/20/23 0521)  ipratropium-albuterol (DUONEB) 0.5-2.5 (3) MG/3ML nebulizer solution 3 mL (3 mLs Nebulization Given 05/20/23 0323)  acetaminophen (TYLENOL) tablet 1,000 mg (1,000 mg Oral Given 05/20/23 0306)  calcium gluconate 1 g/ 50 mL sodium chloride IVPB (0 mg Intravenous Stopped 05/20/23 0540)  sodium zirconium cyclosilicate (LOKELMA) packet 5 g (5 g Oral Given 05/20/23 0442)  sodium bicarbonate injection 50 mEq (50 mEq Intravenous Given 05/20/23 0440)  0.9 %  sodium chloride infusion ( Intravenous New Bag/Given 05/20/23 0442)    ED Course/ Medical Decision Making/ A&P                             Medical Decision Making Amount and/or Complexity of Data Reviewed Labs: ordered. Radiology: ordered.  Risk OTC drugs. Prescription drug management. Decision regarding hospitalization.  Sepsis initiated after evaluate the patient, unknown source although suspect likely pulmonary versus urinary.  She does have a murmur but it appears that she had aortic calcification and stenosis (moderate to severe) on recent hospitalization with her NSTEMI.  Initiated sepsis labs we will also do cardiac labs and probably get a CT of her head as well with the fall and the generalized weakness.  Likely need to be admitted.  My interpretation of her x-ray does appear that she probably has a right pneumonia.  Radiology read thinks is probably fluid and her BNP is elevated however with her fever, altered mental status and increased oxygen requirement with cough and weakness I think is more likely to be infection.  Treated for the same.  Lactic acid came back normal and her blood pressures are stable she is not tachycardic so I discontinued  her fluid boluses.  She was found to be hyponatremic so switched to normal saline infusion.  This could be contributing to her altered mental status.  Also found to be hyperkalemic, calcium, bicarb, fluids and Lokelma given.  Over couple hours consider BiPAP versus intubation however patient's respiratory status continue to improve to include a respiratory rate that dropped from the 40s to mid 20s with her oxygen staying stable.  Her mental status stayed stable and in fact she became a little bit more energetic and lively but still disoriented.  Long discussion with her son and then her grandson (the resident physician and psychiatry training in [place] right now) and patient is full code and would want to be intubated if needed.  Secondary to the possible pulmonary edema would have low threshold for starting BiPAP however at this time I do think it is indicated.  Discussed with hospitalist, Dr. Cristina Gong, who will put in bed request.  Final Clinical Impression(s) / ED Diagnoses Final diagnoses:  Sepsis, due to unspecified organism, unspecified whether acute organ dysfunction present [facility])  Neutropenic fever (HCC)  Pneumonia of right middle lobe due to infectious organism    Rx / DC Orders ED Discharge Orders     None         Brock Larmon, Barbara Cower, MD 05/20/23 [id number]

## 2023-05-20 NOTE — Plan of Care (Signed)
  Problem: Education: Goal: Knowledge of General Education information will improve Description: Including pain rating scale, medication(s)/side effects and non-pharmacologic comfort measures Outcome: Progressing   Problem: Health Behavior/Discharge Planning: Goal: Ability to manage health-related needs will improve Outcome: Progressing   Problem: Clinical Measurements: Goal: Ability to maintain clinical measurements within normal limits will improve Outcome: Progressing Goal: Will remain free from infection Outcome: Progressing Goal: Cardiovascular complication will be avoided Outcome: Progressing   Problem: Coping: Goal: Level of anxiety will decrease Outcome: Progressing   Problem: Elimination: Goal: Will not experience complications related to bowel motility Outcome: Progressing Goal: Will not experience complications related to urinary retention Outcome: Progressing   Problem: Pain Managment: Goal: General experience of comfort will improve Outcome: Progressing   Problem: Safety: Goal: Ability to remain free from injury will improve Outcome: Progressing   Problem: Skin Integrity: Goal: Risk for impaired skin integrity will decrease Outcome: Progressing   Problem: Clinical Measurements: Goal: Diagnostic test results will improve Outcome: Not Progressing Goal: Respiratory complications will improve Outcome: Not Progressing   Problem: Activity: Goal: Risk for activity intolerance will decrease Outcome: Not Progressing   Problem: Nutrition: Goal: Adequate nutrition will be maintained Outcome: Not Progressing   Problem: Safety: Goal: Non-violent Restraint(s) Outcome: Not Applicable

## 2023-05-20 NOTE — Progress Notes (Signed)
Critical lab value received at 1740  K+ 6.7  Dr. Laural Benes notified at [phone]

## 2023-05-20 NOTE — Consult Note (Addendum)
   TELE-PCCM CONSULT This consult was provided via telemedicine with 2-way video and audio communication.  NAME:  Lisa [name], MRN:  [id number], DOB:  October 07, 1934, LOS: 0 ADMISSION DATE:  05/20/2023, CONSULTATION DATE:  05/20/2023 REFERRING MD:  Maryln Manuel MD, CHIEF COMPLAINT: Acute hypercarbic respiratory failure  History of Present Illness:   89 year old with severe COPD, chronic hypoxic respiratory failure, HFpEF, CKD, paroxysmal atrial fibrillation, MDS presenting with weakness, acute on chronic hypoxic and hypercarbic respiratory failure, COPD exacerbation. She had a recent admission for NSTEMI, acute blood loss anemia and hypokalemia.  In the ED she got nebulizers, bicarb, initially got 2 L LR and then IV Lasix 40 mg + Lokelma for hyperkalemia.  PCCM consulted for BiPAP management and respiratory failure.  Pertinent  Medical History    has a past medical history of Adenocarcinoma of left lung (HCC) (2006), Arthritis, Asthma, Cancer of breast, female (HCC), Cancer of lung (HCC), Dysrhythmia, Hypertension, Hypothyroidism, Invasive ductal carcinoma of left breast (HCC) (1999), Myocardial infarction (HCC), Neutropenia (HCC) (06/09/2016), and Personal history of radiation therapy.   Significant Hospital Events: Including procedures, antibiotic start and stop dates in addition to other pertinent events     Interim History / Subjective:    Objective   Blood pressure (!) 109/45, pulse 97, temperature 98.2 F (36.8 C), temperature source Axillary, resp. rate (!) 30, height 5\' 3"  (1.6 m), weight 75.3 kg, SpO2 96%.    FiO2 (%):  [40 %-60 %] 40 %   Intake/Output Summary (Last 24 hours) at 05/20/2023 1805 Last data filed at 05/20/2023 1300 Gross per 24 hour  Intake 1390 ml  Output --  Net 1390 ml   Filed Weights   05/20/23 0237  Weight: 75.3 kg    Examination: via telemetry camera Appears chronically ill, slumped over, on BiPAP  Lab/imaging review: ABG 7.22, pCO2 69, pO2  81 Sodium 145, potassium 6.7, creatinine 1.35 WBC 5.5, hemoglobin 7.8, platelets 101  Chest x-ray 05/20/2023-bilateral pulmonary infiltrates consistent with pulmonary edema, small bilateral effusions.  Resolved Hospital Problem list     Assessment & Plan:  Acute on chronic hypoxic, hypercarbic respiratory failure secondary to COPD exacerbation, HFpEF, severe pulmonary HTN Continues on BiPAP.  ABG is unchanged Will increase respiratory rate to 24, IPAP setting at 18, EPAP 8 Continue ceftriaxone, azithromycin. Check urine pneumococcus, Legionella. Start steroids, bronchodilators for COPD exacerbation Lasix 40 mg a day x 2 to help with diuresis  Hyperkalemia, AKI Hyponatremia Received Lokelma Lasix as above Nephrology is on board  Goals of care Primary team is discussing with patient's family.  She is full code.  Best Practice (right click and "Reselect all SmartList Selections" daily)   Per primary team  Critical care time:   The patient is critically ill with multiple organ system failure and requires high complexity decision making for assessment and support, frequent evaluation and titration of therapies, advanced monitoring, review of radiographic studies and interpretation of complex data.   Critical Care Time devoted to patient care services, exclusive of separately billable procedures, described in this note is 35 minutes.   Chilton Greathouse MD [facility] See Amion for pager  If no response to pager , please call [phone] until 7pm After 7:00 pm call Elink  [phone] 05/20/2023, 6:18 PM

## 2023-05-20 NOTE — Progress Notes (Signed)
  Echocardiogram 2D Echocardiogram has been performed.  Maren Reamer 05/20/2023, 11:47 AM

## 2023-05-20 NOTE — TOC Initial Note (Signed)
Transition of Care [facility]) - Initial/Assessment Note    Patient Details  Name: Lisa [name] MRN: [id number] Date of Birth: [birth year]  Transition of [facility]) CM/SW Contact:    Annice Needy, LCSW Phone Number: 05/20/2023, 2:36 PM  Clinical Narrative:                 Patient from home with family. Has 24/7 care. Admitted for CAP. Ambulates with a walker, on home oxygen (2L), has cane and shower chair. Family transports to appointments. ADLs are assisted by family. PT recommends HHPT. HHPT presented to son, Link Snuffer, he indicates that he is not sure if they family wants HHPT and would get back with TOC with an answer. Per record review, Last month patient was offered HHPT and family declined.   Expected Discharge Plan: Home w Home Health Services Barriers to Discharge: Continued Medical Work up   Patient Goals and CMS Choice Patient states their goals for this hospitalization and ongoing recovery are:: return home          Expected Discharge Plan and Services       Living arrangements for the past 2 months: Single Family Home                                      Prior Living Arrangements/Services Living arrangements for the past 2 months: Single Family Home Lives with:: Adult Children Patient language and need for interpreter reviewed:: Yes Do you feel safe going back to the place where you live?: Yes      Need for Family Participation in Patient Care: Yes (Comment) Care giver support system in place?: Yes (comment) Current home services: DME Criminal Activity/Legal Involvement Pertinent to Current Situation/Hospitalization: No - Comment as needed  Activities of Daily Living Home Assistive Devices/Equipment: Walker (specify type) ADL Screening (condition at time of admission) Patient's cognitive ability adequate to safely complete daily activities?: Yes Is the patient deaf or have difficulty hearing?: Yes Does the patient have difficulty seeing, even when  wearing glasses/contacts?: Yes Does the patient have difficulty concentrating, remembering, or making decisions?: Yes Patient able to express need for assistance with ADLs?: Yes Does the patient have difficulty dressing or bathing?: Yes Independently performs ADLs?: No Communication: Needs assistance Is this a change from baseline?: Pre-admission baseline Dressing (OT): Needs assistance Is this a change from baseline?: Pre-admission baseline Grooming: Needs assistance Is this a change from baseline?: Pre-admission baseline Feeding: Needs assistance Is this a change from baseline?: Pre-admission baseline Bathing: Needs assistance Is this a change from baseline?: Pre-admission baseline Toileting: Needs assistance Is this a change from baseline?: Pre-admission baseline In/Out Bed: Needs assistance Is this a change from baseline?: Pre-admission baseline Walks in Home: Needs assistance Is this a change from baseline?: Pre-admission baseline Does the patient have difficulty walking or climbing stairs?: Yes Weakness of Legs: Both Weakness of Arms/Hands: Both  Permission Sought/Granted Permission sought to share information with : Family Supports    Share Information with NAME: son, Lucile Hillmann           Emotional Assessment     Affect (typically observed): Appropriate Orientation: : Oriented to Self, Oriented to Place, Oriented to  Time, Oriented to Situation Alcohol / Substance Use: Not Applicable Psych Involvement: No (comment)  Admission diagnosis:  CAP (community acquired pneumonia) [J18.9] Neutropenic fever (HCC) [D70.9, R50.81] Pneumonia of right middle lobe due to infectious organism [J18.9] Sepsis, due  to unspecified organism, unspecified whether acute organ dysfunction present [facility]) [A41.9] Patient Active Problem List   Diagnosis Date Noted   CAP (community acquired pneumonia) 05/20/2023   CAD (coronary artery disease) 05/20/2023   History of non-ST elevation  myocardial infarction (NSTEMI) 05/20/2023   Metabolic acidosis 05/20/2023   Elevated brain natriuretic peptide (BNP) level 05/20/2023   Fever, unspecified 05/20/2023   Altered mental status 05/20/2023   NSTEMI (non-ST elevated myocardial infarction) (HCC) 04/06/2023   Nonrheumatic aortic valve stenosis 11/20/2022   Mitral regurgitation 11/20/2022   Diarrhea 11/07/2022   Pancytopenia (HCC) 11/03/2022   Hyperkalemia 11/03/2022   Acute on chronic diastolic CHF (congestive heart failure) (HCC) 02/11/2022   TIA (transient ischemic attack) 02/10/2022   Myelodysplastic syndrome (HCC) 05/14/2021   Hyponatremia    Labile blood pressure    Debility 10/30/2020   Anemia of chronic disease    Paroxysmal atrial fibrillation (HCC)    Malnutrition of moderate degree 10/28/2020   Bilateral knee effusions    FTT (failure to thrive) in adult    Decreased appetite    Weakness    Gout 10/21/2020   Hypothyroidism 10/21/2020   HTN (hypertension) 10/21/2020   Weight loss 10/21/2020   Generalized muscle weakness 10/21/2020   Osteoarthritis 10/21/2020   Atrial fibrillation with RVR (HCC) 10/21/2020   Deficiency anemia 03/13/2019   Chronic respiratory failure with hypoxia (HCC) 05/31/2017   Hyperglycemia 05/31/2017   Chronic renal failure (CRF), stage 3b (HCC) 05/31/2017   Normocytic anemia 05/31/2017   COPD with acute exacerbation (HCC) 05/31/2017   Abdominal aortic aneurysm 05/31/2017   Urinary incontinence 05/31/2017   Vitamin D deficiency 06/10/2016   Neutropenia (HCC) 06/09/2016   Invasive ductal carcinoma of left breast (HCC) 06/09/2016   Adenocarcinoma of left lung (HCC) 06/09/2016   Malignant neoplasm of unspecified site of unspecified female breast (HCC) 06/09/2016   Adenomatous polyps 02/08/2013   PCP:  [name], MD Pharmacy:   [facility] - [place], [place] - [address]  [place] [address] Phone: [phone] Fax: [phone]  [facility] (NOT  MAIL [facility]) - [place], [place] - [address] [place] [address] Phone: [phone] Fax: [phone]  [facility] - [place], [place] - [address] [place] [address] Phone: [phone] Fax: [phone]     Social Determinants of Health (SDOH) Social History: SDOH Screenings   Food Insecurity: No Food Insecurity (05/20/2023)  Housing: Low Risk  (05/20/2023)  Transportation Needs: No Transportation Needs (05/20/2023)  Utilities: Not At Risk (05/20/2023)  Depression (PHQ2-9): Low Risk  (12/12/2020)  Physical Activity: Inactive (12/12/2020)  Tobacco Use: Medium Risk (05/20/2023)   SDOH Interventions:     Readmission Risk Interventions    08/28/2021    1:06 PM  Readmission Risk Prevention Plan  Transportation Screening Complete  HRI or Home Care Consult Complete  Social Work Consult for Recovery Care Planning/Counseling Complete  Palliative Care Screening Not Applicable  Medication Review Oceanographer) Complete

## 2023-05-20 NOTE — Progress Notes (Signed)
05/20/2023 6:40 PM  Discussed ABG results with patient's daughter at beside and patient's POA her grandson by telephone.  She remains critically ill.  I spoke with PCCM Dr. Isaiah Serge and requested consultation.  The recommendation is to continue bipap for now with setting changes, rate increased to 24, added more IV furosemide, IV solumedrol, bronchodilators.  Sodium slowly improved to 125 but potassium worsening, given additional IV furosemide, calcium, bicarbonate, insulin/dextrose, recheck after treatments.  Pt is urinating freely.  Per grandson, POA, pt remains full code for now but if starts to decompensate further please call him if possible prior to intubating to discuss current status.     Maryln Manuel, MD  How to contact the [facility] Attending or Consulting provider 7A - 7P or covering provider during after hours 7P -7A, for this patient?  Check the care team in [facility] and look for a) attending/consulting TRH provider listed and b) the [facility] team listed Log into [email or web address] and use [facility]'s universal password to access. If you do not have the password, please contact the hospital operator. Locate the [facility], The provider you are looking for under [facility] and page to a number that you can be directly reached. If you still have difficulty reaching the provider, please page the [facility] (Director on Call) for the Hospitalists listed on amion for assistance.

## 2023-05-20 NOTE — H&P (Addendum)
History and Physical    Lisa [name] OZH:[id number] DOB: [birth year] DOA: 05/20/2023  PCP: [name], MD   Patient coming from: Home  Chief Complaint:  Weakness  HPI: Lisa [name] is a 89 y.o. female with medical history significant for COPD c/b chronic hypoxic respiratory failure on 2L baseline, HFpEF, CKD3b, Paroxysmal Afib, and MDS/CMML c/b neutropenia and thrombocytopenia presents with weakness and is admitted for acute on chronic hypoxic and hypercapnic respiratory failure and COPD exacerbation.  Lisa [name] was recently admitted 6/4-6/7 for an NSTEMI, though Lisa [name] course was also complicated by acute blood loss anemia (discharged with PPI BID per GI) and hyperkalemia (discharged with every other day [facility]).  Today, Lisa [name] presents after EMS was called by Lisa [name] family because Lisa [name] has become progressively weaker over the last 2 days. Also endorse decreased appetite, increased confusion, sleeping throughout the day, and ultimately found to have increased O2 requirement after family checked O2 sats in 70s after Lisa [name] collapsed to the floor. Didn't hit head, was witnessed.    ED Course:  On presentation Lisa [name] was advanced to 6L to maintain O2 sats >90%, duonebs, and NaHCO3 after ABG showed 7.23/65/46/26. Lisa [name] K was also elevated at 6.5, without EKG changes, given Calcium gluconate, Lokelma 5g and 2x 1L LR boluses. 7/18 CXR c/w moderate perihilar and lower lobe interstitial pulmonary edema. BNP elevated at 1095 with a normal lactate. Also found to have stable on chronic neutropenia with ANC 200, and stable on chronic thrombocytopenia with PLT at 101k. Temp has fluctuated 97.2 to 101.4. Lisa [name] 7/18 CT Head revealed no acute intracranial process.   Review of Systems: Reviewed as noted above, otherwise negative.  Past Medical History:  Diagnosis Date   Adenocarcinoma of left lung (HCC) 2006   Arthritis    Asthma    Cancer of breast, female (HCC)    Cancer of lung (HCC)     Dysrhythmia    Hypertension    Hypothyroidism    Invasive ductal carcinoma of left breast (HCC) 1999   Myocardial infarction (HCC)    Neutropenia (HCC) 06/09/2016   Personal history of radiation therapy     Past Surgical History:  Procedure Laterality Date   BIOPSY  10/23/2020   Procedure: BIOPSY;  Surgeon: Benancio Deeds, MD;  Location: [facility] ENDOSCOPY;  Service: Gastroenterology;;   BREAST BIOPSY     left axillary node dissection   BREAST LUMPECTOMY Left    COLONOSCOPY  03/08/2003   GEX:[id number] rectal polyps destroyed with the tip of the snare/Polyps at hepatic flexure, splenic flexure at 35 cm/Left-sided diverticula: unable to retrieve path   COLONOSCOPY  09/12/2008   NUU:[id number] rectum and distal sigmoid diminutive polyps/scattered left sided diverticulum. hyperplastic   COLONOSCOPY N/A 03/06/2013   YQI:[id number] polyp-removed as described above; colonic diverticulosis. hyperplastic polyps. next TCS 03/2018   ESOPHAGOGASTRODUODENOSCOPY (EGD) WITH PROPOFOL N/A 10/23/2020   Procedure: ESOPHAGOGASTRODUODENOSCOPY (EGD) WITH PROPOFOL;  Surgeon: Benancio Deeds, MD;  Location: [facility] ENDOSCOPY;  Service: Gastroenterology;  Laterality: N/A;   FOOT SURGERY     LUNG REMOVAL, PARTIAL     upper lobe     reports that Lisa [name] quit smoking about 31 years ago. Lisa [name] smoking use included cigarettes. Lisa [name] started smoking about 66 years ago. Lisa [name] has a 26.3 pack-year smoking history. Lisa [name] has never used smokeless tobacco. Lisa [name] reports that Lisa [name] does not drink alcohol and does not use drugs.  Allergies  Allergen Reactions   Meloxicam Other (See Comments)    Caused an injury  to the kidneys, per nephrologist   Micardis Hct [Telmisartan-Hctz] Other (See Comments)    Caused an injury to the kidneys, per nephrologist    Family History  Problem Relation Age of Onset   Cancer Mother    Cancer Sister    Colon cancer Neg Hx     Prior to Admission medications   Medication Sig Start Date End Date Taking?  Authorizing Provider  acetaminophen (TYLENOL) 325 MG tablet Take 2 tablets (650 mg total) by mouth every 6 (six) hours. 11/11/20  Yes Angiulli, Mcarthur Rossetti, PA-C  albuterol (VENTOLIN HFA) 108 (90 Base) MCG/ACT inhaler Inhale 1-2 puffs into the lungs every 4 (four) hours as needed for wheezing or shortness of breath. 05/25/22  Yes [provider]  Fluticasone-Umeclidin-Vilant (TRELEGY ELLIPTA) 100-62.5-25 MCG/ACT AEPB Inhale 1 puff into the lungs daily. 12/18/22  Yes Oretha Milch, MD  furosemide (LASIX) 20 MG tablet Take 1 tablet (20 mg total) by mouth daily. 04/09/23  Yes Marishka Rentfrow L, MD  gabapentin (NEURONTIN) 100 MG capsule Take 1 capsule (100 mg total) by mouth every 12 (twelve) hours as needed (neuropathy). Patient taking differently: Take 100 mg by mouth 3 (three) times daily as needed (neuropathy). 02/11/22 05/20/23 Yes Vassie Loll, MD  isosorbide mononitrate (IMDUR) 30 MG 24 hr tablet Take 1 tablet (30 mg total) by mouth daily. 04/10/23  Yes Karynn Deblasi L, MD  levothyroxine (SYNTHROID) 100 MCG tablet Take 1 tablet (100 mcg total) by mouth daily before breakfast. 08/28/21  Yes Gwen Edler L, MD  LOKELMA 10 g PACK packet Take 10 g by mouth every other day. 04/12/23  Yes Vickie Ponds L, MD  magnesium oxide (MAG-OX) 400 MG tablet Take 400 mg by mouth 2 (two) times daily.   Yes [provider]  metoprolol succinate (TOPROL-XL) 25 MG 24 hr tablet TAKE 1 TABLET DAILY 02/18/23  Yes Mallipeddi, Vishnu P, MD  mirtazapine (REMERON) 7.5 MG tablet Take 1 tablet (7.5 mg total) by mouth at bedtime. Patient taking differently: Take 15 mg by mouth at bedtime. 05/28/21  Yes Artis Delay, MD  Multiple Vitamin (MULTI-VITAMIN) tablet Take 1 tablet by mouth daily.   Yes [provider]  pantoprazole (PROTONIX) 40 MG tablet Take 1 tablet (40 mg total) by mouth 2 (two) times daily. 04/09/23  Yes Camdyn Beske L, MD  pravastatin (PRAVACHOL) 10 MG tablet Take 1 tablet (10 mg  total) by mouth every evening. 04/09/23  Yes Cleora Fleet, MD    Physical Exam: Vitals:   05/20/23 0500 05/20/23 0515 05/20/23 0600 05/20/23 0745  BP: (!) 121/48 (!) 129/52 (!) 115/57 (!) 112/48  Pulse: 69 71 71 67  Resp: (!) 25 (!) 25 18 (!) 32  Temp:      TempSrc:      SpO2: 96% 97% 96% 99%  Weight:      Height:        Constitutional: NAD, calm, comfortable, looking into lap Vitals:   05/20/23 0500 05/20/23 0515 05/20/23 0600 05/20/23 0745  BP: (!) 121/48 (!) 129/52 (!) 115/57 (!) 112/48  Pulse: 69 71 71 67  Resp: (!) 25 (!) 25 18 (!) 32  Temp:      TempSrc:      SpO2: 96% 97% 96% 99%  Weight:      Height:       Eyes: lids and conjunctivae normal Neck: normal, supple Respiratory: On 6L via [place]. Clear to auscultation bilaterally but with tachypnea.  Cardiovascular: Regular rate and rhythm, systolic  murmurs. Abdomen: no tenderness, no distention. Bowel sounds positive.  Musculoskeletal:  No edema. Skin: no rashes, lesions, ulcers.  Psychiatric: Flat affect  Labs on Admission: I have personally reviewed following labs and imaging studies  CBC: Recent Labs  Lab 05/20/23 0245  WBC 5.5  NEUTROABS 0.2*  HGB 7.8*  HCT 27.6*  MCV 103.0*  PLT 101*   Basic Metabolic Panel: Recent Labs  Lab 05/20/23 0245  NA 122*  K 6.5*  CL 92*  CO2 25  GLUCOSE 111*  BUN 15  CREATININE 1.30*  CALCIUM 8.3*   GFR: Estimated Creatinine Clearance: 29.6 mL/min (A) (by C-G formula based on SCr of 1.3 mg/dL (H)). Liver Function Tests: Recent Labs  Lab 05/20/23 0245  AST 70*  ALT 52*  ALKPHOS 59  BILITOT 0.8  PROT 8.2*  ALBUMIN 3.2*   No results for input(s): "LIPASE", "AMYLASE" in the last 168 hours. Recent Labs  Lab 05/20/23 0246  AMMONIA 30   Coagulation Profile: Recent Labs  Lab 05/20/23 0245  INR 1.1   Cardiac Enzymes: No results for input(s): "CKTOTAL", "CKMB", "CKMBINDEX", "TROPONINI" in the last 168 hours. BNP (last 3 results) No results for  input(s): "PROBNP" in the last 8760 hours. HbA1C: No results for input(s): "HGBA1C" in the last 72 hours. CBG: No results for input(s): "GLUCAP" in the last 168 hours. Lipid Profile: No results for input(s): "CHOL", "HDL", "LDLCALC", "TRIG", "CHOLHDL", "LDLDIRECT" in the last 72 hours. Thyroid Function Tests: No results for input(s): "TSH", "T4TOTAL", "FREET4", "T3FREE", "THYROIDAB" in the last 72 hours. Anemia Panel: No results for input(s): "VITAMINB12", "FOLATE", "FERRITIN", "TIBC", "IRON", "RETICCTPCT" in the last 72 hours. Urine analysis:    Component Value Date/Time   COLORURINE YELLOW 05/20/2023 0606   APPEARANCEUR CLEAR 05/20/2023 0606   LABSPEC 1.015 05/20/2023 0606   PHURINE 5.0 05/20/2023 0606   GLUCOSEU NEGATIVE 05/20/2023 0606   HGBUR NEGATIVE 05/20/2023 0606   BILIRUBINUR NEGATIVE 05/20/2023 0606   KETONESUR NEGATIVE 05/20/2023 0606   PROTEINUR 30 (A) 05/20/2023 0606   NITRITE NEGATIVE 05/20/2023 0606   LEUKOCYTESUR NEGATIVE 05/20/2023 0606    Radiological Exams on Admission: DG Chest Port 1 View  Result Date: 05/20/2023 CLINICAL DATA:  Sepsis EXAM: PORTABLE CHEST 1 VIEW COMPARISON:  04/06/2023 FINDINGS: Symmetric pulmonary insufflation. Moderate perihilar and lower lung zone interstitial pulmonary infiltrate is present most in keeping with moderate cardiogenic failure. Small bilateral, left greater than right, pleural effusions are present. No pneumothorax. Cardiac size is enlarged, unchanged. No acute bone abnormality. IMPRESSION: 1. Moderate cardiogenic failure. Electronically Signed   By: Helyn Numbers M.D.   On: 05/20/2023 03:35   CT Head Wo Contrast  Result Date: 05/20/2023 CLINICAL DATA:  Mental status change, unknown cause EXAM: CT HEAD WITHOUT CONTRAST TECHNIQUE: Contiguous axial images were obtained from the base of the skull through the vertex without intravenous contrast. RADIATION DOSE REDUCTION: This exam was performed according to the departmental  dose-optimization program which includes automated exposure control, adjustment of the mA and/or kV according to patient size and/or use of iterative reconstruction technique. COMPARISON:  04/14/2023 FINDINGS: Brain: Normal anatomic configuration. Parenchymal volume loss is commensurate with the patient's age. Stable moderate periventricular white matter changes are present likely reflecting the sequela of small vessel ischemia. No abnormal intra or extra-axial mass lesion or fluid collection. No abnormal mass effect or midline shift. No evidence of acute intracranial hemorrhage or infarct. Ventricular size is normal. Cerebellum unremarkable. Vascular: No asymmetric hyperdense vasculature at the skull base. Skull: Intact Sinuses/Orbits:  Paranasal sinuses are clear. Orbits are unremarkable. Other: Mastoid air cells and middle ear cavities are clear. IMPRESSION: 1. No acute intracranial hemorrhage or infarct. 2. Stable senescent change. Electronically Signed   By: Helyn Numbers M.D.   On: 05/20/2023 03:26    EKG: Independently reviewed.   Assessment/Plan Principal Problem:   CAP (community acquired pneumonia)   Acute on chronic hypoxic and hypercapnic respiratory failure c/b COPD Exacerbation Lisa [name] presents with ABG 7.23/65/46/27, with increased supplemental O2 requirement from 2L baseline to 4L in ED. S/p Duonebs x1 and NaHCO3 in ED. 7/18 CXR with moderate perihilar lower lobe interstitial infiltrates c/f pulmonary edema. Given Lisa [name] recent NSTEMI last month, the CXR, and BNP of 1095, suspected acute HFpEF exacerbation. While Lisa [name] is not hypervolemic on exam, cannot completely exclude. Also received 2x 1L LR boluses in ED overnight and appears to be handling that well without increasing O2 requirement. Lisa [name] appears to be settling out and do not feel Lisa [name] would benefit form Bipap right now, but will order VBG now. - Recheck VBG and will switch to Bipap if not improving - Breo Ellipta and Incruse Ellipta  daily - Duoneb TID - Continue supplemental O2  Hyperkalemia Presented with K at 6.5, acute on chronic, and takes Lokelma every other day at home since Lisa [name] last discharge in June. Is now s/p Duoneb (albuterol component effective with beta agonism), Lokelma 5g, and Calcium Gluconate. Presenting EKG without T wave changes. While diuresis with Lasix will help lower K, will continue with mIVF NS in order to slowly replete hyponatremia. - Ordered IV Lasix 40mg  x1 before recheck - Rechecking BMP at 1100 - S/p 5g Lokelma today. - Begin daily 10mg  Lokelma tomorrow, 7/19  Symptomatic Hyponatremia Presented with Na at 122, and also appears to be an acute on chronic problem 2/2 poor PO intake, which has been worse over the last two days, per son. Lisa [name] presents with son who endorses concern of increased confusion over last two days as well. Will continue 64ml/hr NS for slow sodium and chloride repletion, not to exceed 6-8 meq/day. Goal Na by 7/19 AM would be 128-130. - mIVF 71ml/hr NS for slow correction - Checking AM serum Cortisol, as well as urine Na, K, and osmolality - Consult to nephrology   HFpEF ?exacerbation, and HTN 7/18 CXR with moderate perihilar lower lobe interstitial infiltrates c/f pulmonary edema. Given Lisa [name] recent NSTEMI last month, the CXR, and BNP of 1095, originally suspected acute HFpEF exacerbation. While Lisa [name] is not hypervolemic on exam, cannot completely exclude acute exacerbation. However, Lisa [name] received 2x 1L LR boluses in ED overnight and appears to be handling that well without increasing O2 requirement. Will continue to monitor closely. Having to balance correction of symptomatic hyponatremia via IVF, against diuresis needs, and right now will continue mIVF given overall lower clinical suspicion of significant HFpEF exacerbation. Did receive one time IV Lasix 40mg , but primarily for hyperkalemia, as above. - Continue to monitor closely - Imdur 30mg  daily - Metoprolol succinate  25mg    Neutropenia iso MDS/CMML-2 Presents with ANC of 200, chronic since September 2022. Lisa [name] temperature fluctuated widely in the ED reports, from 97.2 up to one time febrile range at 101.4. No current infectious symptoms. UA clear, and comparing CXR with symptoms not concerning for PNA. Reviewed 6/27 Bone Marrow biopsy report which was consistent with Chronic Myelomonocytic Leukemia-2 with hypercellular bone marrow with increased blasts at 10-15%. Appears Lisa [name] may not have seen Lisa [name] heme/onc Dr. Kirtland Bouchard since those results.  -  Will continue to monitor  Thrombocytopenia Presents with PLT at 101k, and has fluctuated between 52k and 105k since at least Fall 2023.  - Will continue to monitor  Recent GI Bleed Presenting Hgb at 7.8, down from 9.2 on 6/27, and 10.0 on 6/12. Last admission 6/4-6/7 was complicated by guaiac positive stool necessitating transfusion, and GI consult. Will check FOBT and consult GI if necessary. - Add FOBT - Continue Protonix 40mg  BID  CAD Recent NSTEMI early June 2024 with admission. No symptoms presently. - Continue Pravastatin 10mg  qhs  Paroxysmal Afib Not a candidate for anticoagulation. Presenting EKG with NSR. - Continue Metoprolol succinate 25mg  daily  Hypothyroidism - Continue Levothyroxine   DVT prophylaxis: Heparin 5ku TID Code Status: Full Family Communication: Updated son at bedside Disposition Plan: Home  Consults called: Nephrology  Severity of Illness: The appropriate patient status for this patient is INPATIENT. Inpatient status is judged to be reasonable and necessary in order to provide the required intensity of service to ensure the patient's safety. The patient's presenting symptoms, physical exam findings, and initial radiographic and laboratory data in the context of their chronic comorbidities is felt to place them at high risk for further clinical deterioration. Furthermore, it is not anticipated that the patient will be medically  stable for discharge from the hospital within 2 midnights of admission.   * I certify that at the point of admission it is my clinical judgment that the patient will require inpatient hospital care spanning beyond 2 midnights from the point of admission due to high intensity of service, high risk for further deterioration and high frequency of surveillance required.*  Signed,  Marcelline Mates, MS4 Working with Dr. Standley Dakins  ATTENDING NOTE  Patient seen and examined with Desiree Lucy, Medical student. In addition to supervising the encounter, I played a key role in the decision making process as well as reviewed key findings.  Lisa [name] appears ill with symptomatic hyponatremia and hyperkalemia.  Lisa [name] will be admitted to stepdown ICU and started on bipap for severe hypercapnia.  Appreciate nephrology assistance with mgmt of hyponatremia.  We will follow Lisa [name] sodium level closely.    Maryln Manuel, MD How to contact the [facility] Attending or Consulting provider 7A - 7P or covering provider during after hours 7P -7A, for this patient?  Check the care team in [facility] and look for a) attending/consulting [facility] provider listed and b) the [facility] team listed Log into [email or web address] and use [facility]'s universal password to access. If you do not have the password, please contact the hospital operator. Locate the [facility] provider you are looking for under [facility] and page to a number that you can be directly reached. If you still have difficulty reaching the provider, please page the [facility] (Director on Call) for the Hospitalists listed on amion for assistance.

## 2023-05-20 NOTE — Progress Notes (Signed)
ED Pharmacy Antibiotic Sign Off An antibiotic consult was received from an ED provider for cefepime and vancomycin per pharmacy dosing for sepsis with unknown source. A chart review was completed to assess appropriateness.   The following one time order(s) were placed:   Cefepime 2g IV x1 Vancomycin 1750g IV x1  Further antibiotic and/or antibiotic pharmacy consults should be ordered by the admitting provider if indicated.   Thank you for allowing pharmacy to be a part of this patient's care.   Arabella Merles, [facility]  Clinical Pharmacist 05/20/23 2:49 AM

## 2023-05-21 ENCOUNTER — Inpatient Hospital Stay (HOSPITAL_COMMUNITY): Payer: Medicare Other

## 2023-05-21 DIAGNOSIS — R627 Adult failure to thrive: Secondary | ICD-10-CM | POA: Diagnosis not present

## 2023-05-21 DIAGNOSIS — D709 Neutropenia, unspecified: Secondary | ICD-10-CM | POA: Diagnosis not present

## 2023-05-21 DIAGNOSIS — J189 Pneumonia, unspecified organism: Secondary | ICD-10-CM | POA: Diagnosis not present

## 2023-05-21 DIAGNOSIS — E871 Hypo-osmolality and hyponatremia: Secondary | ICD-10-CM | POA: Diagnosis not present

## 2023-05-21 LAB — BLOOD GAS, ARTERIAL
Acid-Base Excess: 2.4 mmol/L — ABNORMAL HIGH (ref 0.0–2.0)
Acid-base deficit: 0.7 mmol/L (ref 0.0–2.0)
Acid-base deficit: 0.3 mmol/L (ref 0.0–2.0)
Bicarbonate: 26.6 mmol/L (ref 20.0–28.0)
Bicarbonate: 27.7 mmol/L (ref 20.0–28.0)
Bicarbonate: 28.6 mmol/L — ABNORMAL HIGH (ref 20.0–28.0)
Drawn by: 35043
Drawn by: 22179
Drawn by: 23430
FIO2: 40 %
FIO2: 40 %
O2 Saturation: 99.8 %
O2 Saturation: 100 %
O2 Saturation: 100 %
Patient temperature: 37.1
Patient temperature: 36.4
Patient temperature: 36.6
pCO2 arterial: 54 mmHg — ABNORMAL HIGH (ref 32–48)
pCO2 arterial: 57 mmHg — ABNORMAL HIGH (ref 32–48)
pCO2 arterial: 52 mmHg — ABNORMAL HIGH (ref 32–48)
pH, Arterial: 7.3 — ABNORMAL LOW (ref 7.35–7.45)
pH, Arterial: 7.29 — ABNORMAL LOW (ref 7.35–7.45)
pH, Arterial: 7.35 (ref 7.35–7.45)
pO2, Arterial: 105 mmHg (ref 83–108)
pO2, Arterial: 104 mmHg (ref 83–108)
pO2, Arterial: 92 mmHg (ref 83–108)

## 2023-05-21 LAB — COMPREHENSIVE METABOLIC PANEL
ALT: 39 U/L (ref 0–44)
AST: 55 U/L — ABNORMAL HIGH (ref 15–41)
Albumin: 2.6 g/dL — ABNORMAL LOW (ref 3.5–5.0)
Alkaline Phosphatase: 47 U/L (ref 38–126)
Anion gap: 6 (ref 5–15)
BUN: 17 mg/dL (ref 8–23)
CO2: 24 mmol/L (ref 22–32)
Calcium: 7.9 mg/dL — ABNORMAL LOW (ref 8.9–10.3)
Chloride: 96 mmol/L — ABNORMAL LOW (ref 98–111)
Creatinine, Ser: 1.22 mg/dL — ABNORMAL HIGH (ref 0.44–1.00)
GFR, Estimated: 43 mL/min — ABNORMAL LOW (ref >60)
Glucose, Bld: 109 mg/dL — ABNORMAL HIGH (ref 70–99)
Potassium: 5.9 mmol/L — ABNORMAL HIGH (ref 3.5–5.1)
Sodium: 126 mmol/L — ABNORMAL LOW (ref 135–145)
Total Bilirubin: 0.9 mg/dL (ref 0.3–1.2)
Total Protein: 7.1 g/dL (ref 6.5–8.1)

## 2023-05-21 LAB — CBC
HCT: 26.7 % — ABNORMAL LOW (ref 36.0–46.0)
Hemoglobin: 7.3 g/dL — ABNORMAL LOW (ref 12.0–15.0)
MCH: 28.6 pg (ref 26.0–34.0)
MCHC: 27.3 g/dL — ABNORMAL LOW (ref 30.0–36.0)
MCV: 104.7 fL — ABNORMAL HIGH (ref 80.0–100.0)
Platelets: 79 10*3/uL — ABNORMAL LOW (ref 150–400)
RBC: 2.55 MIL/uL — ABNORMAL LOW (ref 3.87–5.11)
RDW: 22.6 % — ABNORMAL HIGH (ref 11.5–15.5)
WBC: 4.4 10*3/uL (ref 4.0–10.5)
nRBC: 23 % — ABNORMAL HIGH (ref 0.0–0.2)

## 2023-05-21 LAB — OSMOLALITY, URINE: Osmolality, Ur: 261 mOsm/kg — ABNORMAL LOW (ref 300–900)

## 2023-05-21 LAB — BASIC METABOLIC PANEL
Anion gap: 6 (ref 5–15)
BUN: 19 mg/dL (ref 8–23)
CO2: 28 mmol/L (ref 22–32)
Calcium: 8.5 mg/dL — ABNORMAL LOW (ref 8.9–10.3)
Chloride: 96 mmol/L — ABNORMAL LOW (ref 98–111)
Creatinine, Ser: 1.17 mg/dL — ABNORMAL HIGH (ref 0.44–1.00)
GFR, Estimated: 45 mL/min — ABNORMAL LOW (ref >60)
Glucose, Bld: 112 mg/dL — ABNORMAL HIGH (ref 70–99)
Potassium: 6 mmol/L — ABNORMAL HIGH (ref 3.5–5.1)
Sodium: 130 mmol/L — ABNORMAL LOW (ref 135–145)

## 2023-05-21 LAB — GLUCOSE, CAPILLARY
Glucose-Capillary: 110 mg/dL — ABNORMAL HIGH (ref 70–99)
Glucose-Capillary: 97 mg/dL (ref 70–99)

## 2023-05-21 LAB — CULTURE, BLOOD (ROUTINE X 2)

## 2023-05-21 LAB — STREP PNEUMONIAE URINARY ANTIGEN
Strep Pneumo Urinary Antigen: NEGATIVE
Strep Pneumo Urinary Antigen: NEGATIVE

## 2023-05-21 LAB — MAGNESIUM: Magnesium: 2.1 mg/dL (ref 1.7–2.4)

## 2023-05-21 LAB — POTASSIUM: Potassium: 6 mmol/L — ABNORMAL HIGH (ref 3.5–5.1)

## 2023-05-21 LAB — SODIUM: Sodium: 128 mmol/L — ABNORMAL LOW (ref 135–145)

## 2023-05-21 MED ORDER — FUROSEMIDE 10 MG/ML IJ SOLN
40.0000 mg | Freq: Once | INTRAMUSCULAR | Status: AC
  Administered 2023-05-21: 40 mg via INTRAVENOUS
  Filled 2023-05-21: qty 4

## 2023-05-21 MED ORDER — SODIUM ZIRCONIUM CYCLOSILICATE 10 G PO PACK: 10.0000 g | PACK | Freq: Two times a day (BID) | ORAL | Status: DC

## 2023-05-21 MED ORDER — FUROSEMIDE 10 MG/ML IJ SOLN
INTRAMUSCULAR | Status: AC
  Filled 2023-05-21: qty 4

## 2023-05-21 MED ORDER — FUROSEMIDE 10 MG/ML IJ SOLN
40.0000 mg | Freq: Two times a day (BID) | INTRAMUSCULAR | Status: DC
  Administered 2023-05-21: 40 mg via INTRAVENOUS

## 2023-05-21 MED ORDER — LORAZEPAM 2 MG/ML IJ SOLN
INTRAMUSCULAR | Status: AC
  Filled 2023-05-21: qty 1

## 2023-05-21 MED ORDER — SODIUM ZIRCONIUM CYCLOSILICATE 10 G PO PACK
10.0000 g | PACK | Freq: Two times a day (BID) | ORAL | Status: DC
  Administered 2023-05-21 – 2023-05-22 (×3): 10 g via ORAL
  Filled 2023-05-21 (×3): qty 1

## 2023-05-21 MED ORDER — LORAZEPAM 2 MG/ML IJ SOLN
1.0000 mg | Freq: Once | INTRAMUSCULAR | Status: AC
  Administered 2023-05-21: 1 mg via INTRAVENOUS

## 2023-05-21 MED ORDER — ORAL CARE MOUTH RINSE: 15.0000 mL | OROMUCOSAL | Status: DC | PRN

## 2023-05-21 MED ORDER — FLUDROCORTISONE ACETATE 0.1 MG PO TABS
0.1000 mg | ORAL_TABLET | Freq: Every day | ORAL | Status: DC
  Administered 2023-05-21 – 2023-05-28 (×8): 0.1 mg via ORAL
  Filled 2023-05-21 (×9): qty 1

## 2023-05-21 MED ORDER — ORAL CARE MOUTH RINSE
15.0000 mL | OROMUCOSAL | Status: DC
  Administered 2023-05-21 – 2023-05-28 (×27): 15 mL via OROMUCOSAL

## 2023-05-21 NOTE — Progress Notes (Signed)
   TELE-PCCM CONSULT This consult was provided via telemedicine with 2-way video and audio communication.  NAME:  Lisa [name], MRN:  [id number], DOB:  1934/06/19, LOS: 1 ADMISSION DATE:  05/20/2023, CONSULTATION DATE:  05/20/2023 REFERRING MD:  Maryln Manuel MD, CHIEF COMPLAINT: Acute hypercarbic respiratory failure  History of Present Illness:   89 year old with severe COPD, chronic hypoxic respiratory failure, HFpEF, CKD, paroxysmal atrial fibrillation, MDS presenting with weakness, acute on chronic hypoxic and hypercarbic respiratory failure, COPD exacerbation. She had a recent admission for NSTEMI, acute blood loss anemia and hypokalemia.  In the ED she got nebulizers, bicarb, initially got 2 L LR and then IV Lasix 40 mg + Lokelma for hyperkalemia.  PCCM consulted for BiPAP management and respiratory failure.  Pertinent  Medical History    has a past medical history of Adenocarcinoma of left lung (HCC) (2006), Arthritis, Asthma, Cancer of breast, female (HCC), Cancer of lung (HCC), Dysrhythmia, Hypertension, Hypothyroidism, Invasive ductal carcinoma of left breast (HCC) (1999), Myocardial infarction (HCC), Neutropenia (HCC) (06/09/2016), and Personal history of radiation therapy.   Significant Hospital Events: Including procedures, antibiotic start and stop dates in addition to other pertinent events   7/18 Consult  Interim History / Subjective:   Remains on Bipap. ABG is improved  Objective   Blood pressure (!) 116/34, pulse 69, temperature (!) 97.5 F (36.4 C), temperature source Axillary, resp. rate (!) 24, height 5\' 3"  (1.6 m), weight 75.3 kg, SpO2 98%.    FiO2 (%):  [40 %-60 %] 40 %   Intake/Output Summary (Last 24 hours) at 05/21/2023 0753 Last data filed at 05/21/2023 0349 Gross per 24 hour  Intake 290 ml  Output 900 ml  Net -610 ml   Filed Weights   05/20/23 0237  Weight: 75.3 kg    Examination: via telemetry camera Appears chronically ill, slumped over, on  BiPAP  Lab/imaging review: ABG 7.3, Pco2 54, Po2 1051  Chest x-ray 05/20/2023-bilateral pulmonary infiltrates consistent with pulmonary edema, small bilateral effusions.  Resolved Hospital Problem list     Assessment & Plan:  Acute on chronic hypoxic, hypercarbic respiratory failure secondary to COPD exacerbation, HFpEF, severe pulmonary HTN Continues on BiPAP.  Can give breaks for a few hr today Monitor ABG and intermittent CXR Continue ceftriaxone, azithromycin. Check urine pneumococcus, Legionella. Continue steroids, bronchodilators for COPD exacerbation Awaiting labs and can retry Lasix 40 mg a day x 2 to help with diuresis is Cr is stable  Hyperkalemia, AKI Hyponatremia Lasix as above Nephrology is on board  Goals of care Primary team is discussing with patient's family.  She is full code.  Best Practice (right click and "Reselect all SmartList Selections" daily)   Per primary team  Critical care time:   The patient is critically ill with multiple organ system failure and requires high complexity decision making for assessment and support, frequent evaluation and titration of therapies, advanced monitoring, review of radiographic studies and interpretation of complex data.   Critical Care Time devoted to patient care services, exclusive of separately billable procedures, described in this note is 35 minutes.   Chilton Greathouse MD  Pulmonary & Critical care See Amion for pager  If no response to pager , please call [phone] until 7pm After 7:00 pm call Elink  [phone] 05/21/2023, 7:53 AM

## 2023-05-21 NOTE — Progress Notes (Signed)
PROGRESS NOTE    Lisa [name]  ZHY:[id number] DOB: Apr 24, 1934 DOA: 05/20/2023 PCP: [name], MD   Brief Narrative:  Lisa [name] is a 89 y.o. female with medical history significant for COPD c/b chronic hypoxic respiratory failure on 2L baseline, HFpEF, CKD3b, Paroxysmal Afib, and MDS/CMML c/b chronic neutropenia and thrombocytopenia, and chronic hyperkalemia, who presented with weakness and was admitted for acute on chronic hypoxic and hypercapnic respiratory failure and COPD exacerbation.   Lisa [name] was recently admitted 6/4-6/7 for an NSTEMI, though her course was also complicated by acute blood loss anemia (discharged with PPI BID per GI) and hyperkalemia (discharged with every other day [facility]).   On 7/18, Lisa [name] presented after EMS was called by her family because she had become progressively weaker over the prior 2 days. Also endorsed decreased appetite, increased confusion, sleeping throughout the day, and ultimately found to have increased O2 requirement after family checked O2 sats in 70s after Lisa [name] collapsed to the floor.   On presentation to ED, Lisa [name] was advanced to 6L to maintain O2 sats >90%, duonebs, and NaHCO3 after ABG showed 7.23/65/46/26. Subsequent ABG worsened to 7.22/72/<31/30 and she was moved to BiPAP as she was brought into ICU. Her K was also elevated at 6.5, without EKG changes, given Calcium gluconate, Lokelma 5g and 2x 1L LR boluses. 7/18 CXR c/w moderate perihilar and lower lobe interstitial pulmonary edema. BNP elevated at 1095 with a normal lactate. Also found to have stable on chronic neutropenia with ANC 200, and stable on chronic thrombocytopenia with PLT at 101k. Temp has fluctuated 97.2 to 101.4. Her 7/18 CT Head revealed no acute intracranial process.    Over the course of afternoon and night of 7/18, consulted PCCM for BiPAP settings. Continued to manage K with Lasix and Lokelma. This morning, 7/19, K is at 6.0 and Na has improved to  128. Per PCCM recs, will trial pt off of Bipap today, repeat CXR, and continue IV Lasix 40mg . Nephrology adding fludrocortisone and increasing Lokelma to BID for hyperkalemia.    Assessment & Plan:   Principal Problem:   CAP (community acquired pneumonia) Active Problems:   Neutropenia (HCC)   Invasive ductal carcinoma of left breast (HCC)   Adenocarcinoma of left lung (HCC)   Chronic respiratory failure with hypoxia (HCC)   Chronic renal failure (CRF), stage 3b (HCC)   Normocytic anemia   Hypothyroidism   HTN (hypertension)   FTT (failure to thrive) in adult   Paroxysmal atrial fibrillation (HCC)   Hyponatremia   Myelodysplastic syndrome (HCC)   Pancytopenia (HCC)   Hyperkalemia   CAD (coronary artery disease)   History of non-ST elevation myocardial infarction (NSTEMI)   Metabolic acidosis   Elevated brain natriuretic peptide (BNP) level   Fever, unspecified   Altered mental status  Assessment and Plan:  Acute on chronic hypoxic and hypercapnic respiratory failure c/b COPD Exacerbation, ?CAP Pt presented with ABG 7.23/65/46/27, with increased supplemental O2 requirement from 2L baseline to 4L in ED. 7/18 CXR with moderate perihilar lower lobe interstitial infiltrates c/f pulmonary edema. Given her recent NSTEMI last month, the CXR, and BNP of 1095, was suspicious of acute HFpEF exacerbation, but not matching clinical exam and pt handled initial 1L LR boluses in ED well. 7/19 Echo with preserved EF however did demonstrate severe pulmonary artery hypertension.  Appreciate PCCM recs, pt on Bipap overnight and will trial off Bipap for a few hours today, following recs. Continuing with IV 40mg  Lasix, and repeat  CXR today.  - Ceftriaxone and Azithromycin initiated 7/19 for ?CAP coverage - Repeat CXR  - Continue IV Lasix 40mg  twice today (received 7am already) - Breo Ellipta and Incruse Ellipta daily - Arfomoterol nebs BID - Pulmicort 0.5 BID - IV Methylprednisolone 40mg  BID  initiated 7/19 (IV Protonix 40mg  daily) - Revefenacin nebs daily - Duoneb QID prn - Continue supplemental O2   Hyperkalemia Presented with K at 6.5, acute on chronic, and takes Lokelma every other day at home since her last discharge in June. Has continued to be elevated despite Lasix, Lokelma, Insulin with D50, but better on last recheck at 6.0. Repeat EKGs without T wave changes. Appreciate Nephrology recs. Increasing IV Lasix 40mg  to BID today, and increasing Lokelma to BID as well. 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 - IV Lasix 40mg  BID today - Rechecking BMP q12hr - Increase to 10mg  Lokelma BID   Symptomatic Hyponatremia Presented with Na at 122, and also appears to be an acute on chronic problem 2/2 poor PO intake, which has been worse over the last two days, per son. Pt presents with son who endorses concern of increased confusion over last two days as well. Less confused today, 7/19. Goal Na by 7/19 AM was 128-130, which was met this AM with Na at 128. Urine sodium 75 with 275 urine osm, not appearing as SIADH, nor adrenal insufficiency given morning cortisol wnl. Also improved alongside lasix, perhaps volume component was present, will continue diuresis as above. Could be Type IV RTA. - Will continue to monitor - Consult to nephrology    HFpEF ?exacerbation, and HTN 7/18 CXR with moderate perihilar lower lobe interstitial infiltrates c/f pulmonary edema. Given her recent NSTEMI last month, the CXR, and BNP of 1095, originally suspected acute HFpEF exacerbation. While pt is not hypervolemic on exam, cannot completely exclude acute exacerbation. However, she received 2x 1L LR boluses in ED overnight and appeared to handle that well. 7/18 Echo with preserved EF, but severe pulmonary hypertension. Continuing IV Lasix 40mg  BID today, but primarily for hyperkalemia, as above. - Continue to monitor closely - Imdur 30mg  daily - Metoprolol succinate 25mg      Neutropenia  iso MDS/CMML-2 Presents with ANC of 200, chronic since September 2022. Her temperature fluctuated widely in the ED reports, from 97.2 up to one time febrile range at 101.4. No current infectious symptoms. UA clear, and comparing CXR with symptoms not concerning for PNA. Reviewed 6/27 Bone Marrow biopsy report which was consistent with Chronic Myelomonocytic Leukemia-2 with hypercellular bone marrow with increased blasts at 10-15%. Appears she may not have seen her heme/onc Dr. Kirtland Bouchard since those results.  - Will continue to monitor   Thrombocytopenia Presents with PLT at 101k, and has fluctuated between 52k and 105k since at least Fall 2023. PLT in 70ks today without evidence of bleeding. - Will continue to monitor   Recent GI Bleed Presenting Hgb at 7.8, down from 9.2 on 6/27, and 10.0 on 6/12. Last admission 6/4-6/7 was complicated by guaiac positive stool necessitating transfusion, and GI consult. Will check FOBT and consult GI if necessary. - Add FOBT - Continue Protonix 40mg  BID   CAD Recent NSTEMI early June 2024 with admission. No symptoms presently. - Continue Pravastatin 10mg  qhs   Paroxysmal Afib Not a candidate for anticoagulation. Presenting EKG with NSR. - Continue Metoprolol succinate 25mg  daily   Hypothyroidism - Continue Levothyroxine    DVT prophylaxis: Heparin 5ku TID Code Status:  Full Family Communication: Son and Grandson/POA Disposition Plan: Home, may evolve based on family's wishes (have declined HHPT in past) Status is: Inpatient Remains inpatient appropriate because: requiring BiPAP and further management of acute on chronic hypoxic and hypercapnic respiratory failure   Consultants:  Critical Care Palliative Care Nephrology  Procedures:  None at this time  Antimicrobials:  Initiated broad spectrum on 7/18. Moved to Ceftriaxone and Azithromycin on 7/19, planning for likely 5 days, ending 7/23.   Subjective: Patient seen and evaluated today and  notes that she is uncomfortable, and points to her ear which was pinched by Bipap mask. Readjusted with RT. She confides that she worries she could by dying. Provided compassionate listening, though difficult through Bipap. Consulting Palliative Care as well.  Objective: Vitals:   05/21/23 0846 05/21/23 0900 05/21/23 1000 05/21/23 1104  BP:  (!) 143/45 (!) 122/56   Pulse:  65 69   Resp:  (!) 24 (!) 22   Temp:      TempSrc:      SpO2: 100% 100% 100% 100%  Weight:      Height:        Intake/Output Summary (Last 24 hours) at 05/21/2023 1111 Last data filed at 05/21/2023 0349 Gross per 24 hour  Intake 290 ml  Output 900 ml  Net -610 ml   Filed Weights   05/20/23 0237  Weight: 75.3 kg    Examination:  General exam: Appears stressed and uncomfortable  Respiratory system: On BiPAP. Mild crackles.  Cardiovascular system: S1 & S2 heard, RRR.  Gastrointestinal system: Abdomen is tender over lower quadrants Central nervous system: Alert and awake Extremities: No edema Skin: No significant lesions noted Psychiatry: Flat affect.    Data Reviewed: I have personally reviewed following labs and imaging studies  CBC: Recent Labs  Lab 05/20/23 0245 05/21/23 0404  WBC 5.5 4.4  NEUTROABS 0.2*  --   HGB 7.8* 7.3*  HCT 27.6* 26.7*  MCV 103.0* 104.7*  PLT 101* 79*   Basic Metabolic Panel: Recent Labs  Lab 05/20/23 0245 05/20/23 1013 05/20/23 1450 05/20/23 1938 05/20/23 2328 05/21/23 0404 05/21/23 0814  NA 122* 123* 125* 126*  124* 127* 126* 128*  K 6.5* 5.9* 6.7* 6.0* 5.7* 5.9* 6.0*  CL 92* 93* 94* 96*  --  96*  --   CO2 25 26 25  21*  --  24  --   GLUCOSE 111* 96 96 99  --  109*  --   BUN 15 16 16 16   --  17  --   CREATININE 1.30* 1.19* 1.35* 1.39*  --  1.22*  --   CALCIUM 8.3* 8.1* 8.1* 8.2*  --  7.9*  --   MG  --   --   --   --   --  2.1  --    GFR: Estimated Creatinine Clearance: 31.6 mL/min (A) (by C-G formula based on SCr of 1.22 mg/dL (H)). Liver Function  Tests: Recent Labs  Lab 05/20/23 0245 05/21/23 0404  AST 70* 55*  ALT 52* 39  ALKPHOS 59 47  BILITOT 0.8 0.9  PROT 8.2* 7.1  ALBUMIN 3.2* 2.6*   No results for input(s): "LIPASE", "AMYLASE" in the last 168 hours. Recent Labs  Lab 05/20/23 0246  AMMONIA 30   Coagulation Profile: Recent Labs  Lab 05/20/23 0245  INR 1.1   Cardiac Enzymes: No results for input(s): "CKTOTAL", "CKMB", "CKMBINDEX", "TROPONINI" in the last 168 hours. BNP (last 3 results) No results for input(s): "PROBNP" in  the last 8760 hours. HbA1C: No results for input(s): "HGBA1C" in the last 72 hours. CBG: Recent Labs  Lab 05/20/23 2321 05/21/23 0052 05/21/23 0328  GLUCAP 97 97 110*   Lipid Profile: No results for input(s): "CHOL", "HDL", "LDLCALC", "TRIG", "CHOLHDL", "LDLDIRECT" in the last 72 hours. Thyroid Function Tests: Recent Labs    05/20/23 1002  TSH 4.412   Anemia Panel: No results for input(s): "VITAMINB12", "FOLATE", "FERRITIN", "TIBC", "IRON", "RETICCTPCT" in the last 72 hours. Sepsis Labs: Recent Labs  Lab 05/20/23 0245 05/20/23 0812  PROCALCITON  --  0.16  LATICACIDVEN 1.5  --     Recent Results (from the past 240 hour(s))  Resp panel by RT-PCR (RSV, Flu A&B, Covid) Anterior Nasal Swab     Status: None   Collection Time: 05/20/23  2:45 AM   Specimen: Anterior Nasal Swab  Result Value Ref Range Status   SARS Coronavirus 2 by RT PCR NEGATIVE NEGATIVE Final    Comment: (NOTE) SARS-CoV-2 target nucleic acids are NOT DETECTED.  The SARS-CoV-2 RNA is generally detectable in upper respiratory specimens during the acute phase of infection. The lowest concentration of SARS-CoV-2 viral copies this assay can detect is 138 copies/mL. A negative result does not preclude SARS-Cov-2 infection and should not be used as the sole basis for treatment or other patient management decisions. A negative result may occur with  improper specimen collection/handling, submission of specimen  other than nasopharyngeal swab, presence of viral mutation(s) within the areas targeted by this assay, and inadequate number of viral copies(<138 copies/mL). A negative result must be combined with clinical observations, patient history, and epidemiological information. The expected result is Negative.  Fact Sheet for Patients:  BloggerCourse.com  Fact Sheet for Healthcare Providers:  SeriousBroker.it  This test is no t yet approved or cleared by the [place] FDA and  has been authorized for detection and/or diagnosis of SARS-CoV-2 by FDA under an Emergency Use Authorization (EUA). This EUA will remain  in effect (meaning this test can be used) for the duration of the COVID-19 declaration under Section 564(b)(1) of the Act, 21 U.S.C.section 360bbb-3(b)(1), unless the authorization is terminated  or revoked sooner.       Influenza A by PCR NEGATIVE NEGATIVE Final   Influenza B by PCR NEGATIVE NEGATIVE Final    Comment: (NOTE) The Xpert Xpress SARS-CoV-2/FLU/RSV plus assay is intended as an aid in the diagnosis of influenza from Nasopharyngeal swab specimens and should not be used as a sole basis for treatment. Nasal washings and aspirates are unacceptable for Xpert Xpress SARS-CoV-2/FLU/RSV testing.  Fact Sheet for Patients: BloggerCourse.com  Fact Sheet for Healthcare Providers: SeriousBroker.it  This test is not yet approved or cleared by the [place] FDA and has been authorized for detection and/or diagnosis of SARS-CoV-2 by FDA under an Emergency Use Authorization (EUA). This EUA will remain in effect (meaning this test can be used) for the duration of the COVID-19 declaration under Section 564(b)(1) of the Act, 21 U.S.C. section 360bbb-3(b)(1), unless the authorization is terminated or revoked.     Resp Syncytial Virus by PCR NEGATIVE NEGATIVE Final     Comment: (NOTE) Fact Sheet for Patients: BloggerCourse.com  Fact Sheet for Healthcare Providers: SeriousBroker.it  This test is not yet approved or cleared by the [place] FDA and has been authorized for detection and/or diagnosis of SARS-CoV-2 by FDA under an Emergency Use Authorization (EUA). This EUA will remain in effect (meaning this test can be used) for the duration of  the COVID-19 declaration under Section 564(b)(1) of the Act, 21 U.S.C. section 360bbb-3(b)(1), unless the authorization is terminated or revoked.  Performed at [facility], [address] [place], [place] [address]   Blood Culture (routine x 2)     Status: None (Preliminary result)   Collection Time: 05/20/23  2:45 AM   Specimen: Left Antecubital; Blood  Result Value Ref Range Status   Specimen Description LEFT ANTECUBITAL  Final   Special Requests   Final    BOTTLES DRAWN AEROBIC AND ANAEROBIC Blood Culture adequate volume   Culture   Final    NO GROWTH 1 DAY Performed at [facility], [address] [place], [place] [address]    Report Status PENDING  Incomplete  Blood Culture (routine x 2)     Status: None (Preliminary result)   Collection Time: 05/20/23  2:50 AM   Specimen: BLOOD RIGHT HAND  Result Value Ref Range Status   Specimen Description BLOOD RIGHT HAND  Final   Special Requests   Final    BOTTLES DRAWN AEROBIC AND ANAEROBIC Blood Culture adequate volume   Culture   Final    NO GROWTH 1 DAY Performed at [facility], [address] [place], [place] [address]    Report Status PENDING  Incomplete  MRSA Next Gen by PCR, Nasal     Status: None   Collection Time: 05/20/23 12:45 PM   Specimen: Nasal Mucosa; Nasal Swab  Result Value Ref Range Status   MRSA by PCR Next Gen NOT DETECTED NOT DETECTED Final    Comment: (NOTE) The GeneXpert MRSA Assay (FDA approved for NASAL specimens only), is one component of a comprehensive MRSA colonization  surveillance program. It is not intended to diagnose MRSA infection nor to guide or monitor treatment for MRSA infections. Test performance is not FDA approved in patients less than 40 years old. Performed at [facility], [address] [place], [place] [address]          Radiology Studies: ECHOCARDIOGRAM LIMITED  Result Date: 05/20/2023    ECHOCARDIOGRAM LIMITED REPORT   Patient Name:   Susy [name] Date of Exam: 05/20/2023 Medical Rec #:  [id number]      Height:       63.0 in Accession #:    [id number]     Weight:       166.0 lb Date of Birth:  September 08, 1934      BSA:          1.786 m Patient Age:    89 years       BP:           111/57 mmHg Patient Gender: F              HR:           66 bpm. [facility] Procedure: Limited Echo, Cardiac Doppler and Limited Color Doppler Indications:    Bradycardia [[id number]]  History:        Patient has prior history of Echocardiogram examinations, most                 recent 04/07/2023. Previous Myocardial Infarction, CAP; Risk                 Factors:Former Smoker and Hypertension. Breast, Lung Cancer.  Sonographer:    Aron Baba Referring Phys: [id number] Cleora Fleet  Sonographer Comments: Image acquisition challenging due to patient behavioral factors. and Image acquisition challenging due to uncooperative patient. IMPRESSIONS  1.  Limited Echo for LVEF assessment.  2. Left ventricular ejection fraction, by estimation, is 60 to 65%. The left ventricle has normal function. The left ventricle has no regional wall motion abnormalities. There is moderate left ventricular hypertrophy. Left ventricular diastolic function  could not be evaluated.  3. Right ventricular systolic function is normal. The right ventricular size is not well visualized. There is severely elevated pulmonary artery systolic pressure.  4. The mitral valve is normal in structure. Mild mitral valve regurgitation.  5. The tricuspid valve is abnormal. Tricuspid valve regurgitation is  moderate to severe.  6. The aortic valve was not assessed. Aortic valve regurgitation is not visualized. Moderate aortic valve stenosis. Aortic valve Vmax measures 3.00 m/s.  7. The inferior vena cava is dilated in size with <50% respiratory variability, suggesting right atrial pressure of 15 mmHg. Comparison(s): Changes from prior study are noted. Functional moderate to severe TR is new. FINDINGS  Left Ventricle: Left ventricular ejection fraction, by estimation, is 60 to 65%. The left ventricle has normal function. The left ventricle has no regional wall motion abnormalities. The left ventricular internal cavity size was normal in size. There is  moderate left ventricular hypertrophy. Left ventricular diastolic function could not be evaluated. Left ventricular diastolic function could not be evaluated due to nondiagnostic images. Right Ventricle: The right ventricular size is not well visualized. Right vetricular wall thickness was not assessed. Right ventricular systolic function is normal. There is severely elevated pulmonary artery systolic pressure. The tricuspid regurgitant velocity is 3.50 m/s, and with an assumed right atrial pressure of 15 mmHg, the estimated right ventricular systolic pressure is 64.0 mmHg. Left Atrium: Left atrial size was not assessed. Right Atrium: Right atrial size was severely dilated. Pericardium: There is no evidence of pericardial effusion. Mitral Valve: The mitral valve is normal in structure. Mild mitral valve regurgitation. Tricuspid Valve: The tricuspid valve is abnormal. Tricuspid valve regurgitation is moderate to severe. Aortic Valve: The aortic valve was not assessed. Aortic valve regurgitation is not visualized. Moderate aortic stenosis is present. Aortic valve peak gradient measures 36.0 mmHg. Pulmonic Valve: The pulmonic valve was not assessed. Aorta: The aortic root is normal in size and structure. Venous: The inferior vena cava is dilated in size with less than 50%  respiratory variability, suggesting right atrial pressure of 15 mmHg. IAS/Shunts: The interatrial septum was not well visualized. Additional Comments: Spectral Doppler performed. Color Doppler performed.  LEFT VENTRICLE PLAX 2D LVIDd:         3.70 cm LVIDs:         2.40 cm LV PW:         1.40 cm LV IVS:        1.30 cm  LEFT ATRIUM         Index LA diam:    4.00 cm 2.24 cm/m  AORTIC VALVE AV Vmax:      300.00 cm/s AV Peak Grad: 36.0 mmHg MITRAL VALVE                TRICUSPID VALVE MV Area (PHT): 3.77 cm     TR Peak grad:   49.0 mmHg MV Decel Time: 201 msec     TR Vmax:        350.00 cm/s MR Peak grad: 113.2 mmHg MR Vmax:      532.00 cm/s MV E velocity: 113.00 cm/s MV A velocity: 84.80 cm/s MV E/A ratio:  1.33 Vishnu Priya Mallipeddi Electronically signed by Winfield Rast Mallipeddi Signature Date/Time: 05/20/2023/1:55:43 PM  Final    DG Chest Port 1 View  Result Date: 05/20/2023 CLINICAL DATA:  Sepsis EXAM: PORTABLE CHEST 1 VIEW COMPARISON:  04/06/2023 FINDINGS: Symmetric pulmonary insufflation. Moderate perihilar and lower lung zone interstitial pulmonary infiltrate is present most in keeping with moderate cardiogenic failure. Small bilateral, left greater than right, pleural effusions are present. No pneumothorax. Cardiac size is enlarged, unchanged. No acute bone abnormality. IMPRESSION: 1. Moderate cardiogenic failure. Electronically Signed   By: Helyn Numbers M.D.   On: 05/20/2023 03:35   CT Head Wo Contrast  Result Date: 05/20/2023 CLINICAL DATA:  Mental status change, unknown cause EXAM: CT HEAD WITHOUT CONTRAST TECHNIQUE: Contiguous axial images were obtained from the base of the skull through the vertex without intravenous contrast. RADIATION DOSE REDUCTION: This exam was performed according to the departmental dose-optimization program which includes automated exposure control, adjustment of the mA and/or kV according to patient size and/or use of iterative reconstruction technique. COMPARISON:   04/14/2023 FINDINGS: Brain: Normal anatomic configuration. Parenchymal volume loss is commensurate with the patient's age. Stable moderate periventricular white matter changes are present likely reflecting the sequela of small vessel ischemia. No abnormal intra or extra-axial mass lesion or fluid collection. No abnormal mass effect or midline shift. No evidence of acute intracranial hemorrhage or infarct. Ventricular size is normal. Cerebellum unremarkable. Vascular: No asymmetric hyperdense vasculature at the skull base. Skull: Intact Sinuses/Orbits: Paranasal sinuses are clear. Orbits are unremarkable. Other: Mastoid air cells and middle ear cavities are clear. IMPRESSION: 1. No acute intracranial hemorrhage or infarct. 2. Stable senescent change. Electronically Signed   By: Helyn Numbers M.D.   On: 05/20/2023 03:26        Scheduled Meds:  arformoterol  15 mcg Nebulization BID   budesonide (PULMICORT) nebulizer solution  0.5 mg Nebulization BID   Chlorhexidine Gluconate Cloth  6 each Topical Daily   fludrocortisone  0.1 mg Oral Daily   furosemide  40 mg Intravenous Q12H   heparin  5,000 Units Subcutaneous Q8H   isosorbide mononitrate  30 mg Oral Daily   levothyroxine  100 mcg Oral QAC breakfast   methylPREDNISolone (SOLU-MEDROL) injection  40 mg Intravenous Q12H   metoprolol succinate  25 mg Oral Daily   mouth rinse  15 mL Mouth Rinse 4 times per day   pantoprazole (PROTONIX) IV  40 mg Intravenous Q24H   pravastatin  10 mg Oral QPM   revefenacin  175 mcg Nebulization Daily   sodium zirconium cyclosilicate  10 g Oral BID   Continuous Infusions:  azithromycin 500 mg (05/21/23 0904)   cefTRIAXone (ROCEPHIN)  IV       LOS: 1 day    Time spent: 35 minutes  Signed,  Marcelline Mates, MS4 Working with Dr. Standley Dakins

## 2023-05-21 NOTE — Hospital Course (Addendum)
Lisa [name] is a 89 y.o. female with medical history significant for COPD c/b chronic hypoxic respiratory failure on 2L baseline, HFpEF, CKD3b, Paroxysmal Afib, and MDS/CMML c/b chronic neutropenia and thrombocytopenia, and chronic hyperkalemia, who presented with weakness and was admitted for acute on chronic hypoxic and hypercapnic respiratory failure and COPD exacerbation.   [name] was recently admitted 6/4-6/7 for an NSTEMI, though her course was also complicated by acute blood loss anemia (discharged with PPI BID per GI) and hyperkalemia (discharged with every other day [facility]).   On 7/18, [name] presented after EMS was called by her family because she had become progressively weaker over the prior 2 days. Also endorsed decreased appetite, increased confusion, sleeping throughout the day, and ultimately found to have increased O2 requirement after family checked O2 sats in 70s after [name] collapsed to the floor.   On presentation to ED, [name] was advanced to 6L to maintain O2 sats >90%, duonebs, and NaHCO3 after ABG showed 7.23/65/46/26. Subsequent ABG worsened to 7.22/72/<31/30 and she was moved to BiPAP as she was brought into ICU. Her K was also elevated at 6.5, without EKG changes, given Calcium gluconate, Lokelma 5g and 2x 1L LR boluses. 7/18 CXR c/w moderate perihilar and lower lobe interstitial pulmonary edema. BNP elevated at 1095 with a normal lactate. Also found to have stable on chronic neutropenia with ANC 200, and stable on chronic thrombocytopenia with PLT at 101k. Temp has fluctuated 97.2 to 101.4. Her 7/18 CT Head revealed no acute intracranial process.    Over the course of afternoon and night of 7/18, consulted PCCM for BiPAP settings. Continued to manage K with Lasix and Lokelma. This morning, 7/19, K is at 6.0 and Na has improved to 128. Per PCCM recs, will trial pt off of Bipap today, repeat CXR, and continue IV Lasix 40mg . Nephrology adding fludrocortisone and  increasing Lokelma to BID for hyperkalemia.

## 2023-05-21 NOTE — Progress Notes (Signed)
Collected ABG.  Waited on results in lab.  RN notified.   Latest Reference Range & Units 05/21/23 05:00  pH, Arterial 7.35 - 7.45  7.3 (L)  pCO2 arterial 32 - 48 mmHg 54 (H)  pO2, Arterial 83 - 108 mmHg 105  Acid-base deficit 0.0 - 2.0 mmol/L 0.7  Bicarbonate 20.0 - 28.0 mmol/L 26.6  (L): Data is abnormally low (H): Data is abnormally high

## 2023-05-21 NOTE — Progress Notes (Signed)
Subjective:  at least 900 of UOP-   hemodynamically stable-  on bipap with ABG improving-  urine sodium 75 urine osm 275-  TSH and cortisol WNL -  numbers seem to be trending in the right direction -  sodium up to 126-  is repeating "praise the lord "   Objective Vital signs in last 24 hours: Vitals:   05/21/23 0300 05/21/23 0400 05/21/23 0445 05/21/23 0745  BP: (!) 111/28 (!) 117/29 (!) 116/34   Pulse: 65 64 69   Resp: (!) 24 (!) 24    Temp:    (!) 97.5 F (36.4 C)  TempSrc:    Axillary  SpO2: 97% 99% 98%   Weight:      Height:       Weight change:   Intake/Output Summary (Last 24 hours) at 05/21/2023 0806 Last data filed at 05/21/2023 0349 Gross per 24 hour  Intake 290 ml  Output 900 ml  Net -610 ml    Assessment/ Plan: Pt is a 89 y.o. yo female with many medical issues including COPD and MDS who was admitted on 05/20/2023 with hyponatremia and hyperkalemia  Assessment/Plan: 1. Hyponatremia-  volume status euvolemic to heavy.  Urine osm low normal so not significant SIADH but could be some.  Also suspect tea and toast.  Initially given volume -  now on some low dose scheduled lasix with improvement in sodium-  will continue  2. Hyperkalemia-  chronic issue-  almost seems like an aldosterone issue but cortisol is OK so not adrenal insufficiency -  will try a little fludrocortisone and also inc lokelma to BID  3. Anemia-  known MDS supportive care   I think checking labs q 12 hours is adequate for now-  I will make the change-  pt will not be physically seen over the weekend but labs will be evaluated.  Call with any questions    Cecille Aver    Labs: Basic Metabolic Panel: Recent Labs  Lab 05/20/23 1450 05/20/23 1938 05/20/23 2328 05/21/23 0404  NA 125* 126*  124* 127* 126*  K 6.7* 6.0* 5.7* 5.9*  CL 94* 96*  --  96*  CO2 25 21*  --  24  GLUCOSE 96 99  --  109*  BUN 16 16  --  17  CREATININE 1.35* 1.39*  --  1.22*  CALCIUM 8.1* 8.2*  --  7.9*   Liver  Function Tests: Recent Labs  Lab 05/20/23 0245  AST 70*  ALT 52*  ALKPHOS 59  BILITOT 0.8  PROT 8.2*  ALBUMIN 3.2*   No results for input(s): "LIPASE", "AMYLASE" in the last 168 hours. Recent Labs  Lab 05/20/23 0246  AMMONIA 30   CBC: Recent Labs  Lab 05/20/23 0245 05/21/23 0404  WBC 5.5 4.4  NEUTROABS 0.2*  --   HGB 7.8* 7.3*  HCT 27.6* 26.7*  MCV 103.0* 104.7*  PLT 101* 79*   Cardiac Enzymes: No results for input(s): "CKTOTAL", "CKMB", "CKMBINDEX", "TROPONINI" in the last 168 hours. CBG: Recent Labs  Lab 05/20/23 2321 05/21/23 0052 05/21/23 0328  GLUCAP 97 97 110*    Iron Studies: No results for input(s): "IRON", "TIBC", "TRANSFERRIN", "FERRITIN" in the last 72 hours. Studies/Results: ECHOCARDIOGRAM LIMITED  Result Date: 05/20/2023    ECHOCARDIOGRAM LIMITED REPORT   Patient Name:   Lisa [name] Date of Exam: 05/20/2023 Medical Rec #:  [id number]      Height:       63.0 in Accession #:  [id number]     Weight:       166.0 lb Date of Birth:  May 10, 1934      BSA:          1.786 m Patient Age:    89 years       BP:           111/57 mmHg Patient Gender: F              HR:           66 bpm. [facility] Procedure: Limited Echo, Cardiac Doppler and Limited Color Doppler Indications:    Bradycardia [[id number]]  History:        Patient has prior history of Echocardiogram examinations, most                 recent 04/07/2023. Previous Myocardial Infarction, CAP; Risk                 Factors:Former Smoker and Hypertension. Breast, Lung Cancer.  Sonographer:    Aron Baba Referring Phys: [id number] Cleora Fleet  Sonographer Comments: Image acquisition challenging due to patient behavioral factors. and Image acquisition challenging due to uncooperative patient. IMPRESSIONS  1. Limited Echo for LVEF assessment.  2. Left ventricular ejection fraction, by estimation, is 60 to 65%. The left ventricle has normal function. The left ventricle has no regional wall motion  abnormalities. There is moderate left ventricular hypertrophy. Left ventricular diastolic function  could not be evaluated.  3. Right ventricular systolic function is normal. The right ventricular size is not well visualized. There is severely elevated pulmonary artery systolic pressure.  4. The mitral valve is normal in structure. Mild mitral valve regurgitation.  5. The tricuspid valve is abnormal. Tricuspid valve regurgitation is moderate to severe.  6. The aortic valve was not assessed. Aortic valve regurgitation is not visualized. Moderate aortic valve stenosis. Aortic valve Vmax measures 3.00 m/s.  7. The inferior vena cava is dilated in size with <50% respiratory variability, suggesting right atrial pressure of 15 mmHg. Comparison(s): Changes from prior study are noted. Functional moderate to severe TR is new. FINDINGS  Left Ventricle: Left ventricular ejection fraction, by estimation, is 60 to 65%. The left ventricle has normal function. The left ventricle has no regional wall motion abnormalities. The left ventricular internal cavity size was normal in size. There is  moderate left ventricular hypertrophy. Left ventricular diastolic function could not be evaluated. Left ventricular diastolic function could not be evaluated due to nondiagnostic images. Right Ventricle: The right ventricular size is not well visualized. Right vetricular wall thickness was not assessed. Right ventricular systolic function is normal. There is severely elevated pulmonary artery systolic pressure. The tricuspid regurgitant velocity is 3.50 m/s, and with an assumed right atrial pressure of 15 mmHg, the estimated right ventricular systolic pressure is 64.0 mmHg. Left Atrium: Left atrial size was not assessed. Right Atrium: Right atrial size was severely dilated. Pericardium: There is no evidence of pericardial effusion. Mitral Valve: The mitral valve is normal in structure. Mild mitral valve regurgitation. Tricuspid Valve: The  tricuspid valve is abnormal. Tricuspid valve regurgitation is moderate to severe. Aortic Valve: The aortic valve was not assessed. Aortic valve regurgitation is not visualized. Moderate aortic stenosis is present. Aortic valve peak gradient measures 36.0 mmHg. Pulmonic Valve: The pulmonic valve was not assessed. Aorta: The aortic root is normal in size and structure. Venous: The inferior vena cava is dilated in size with less than 50% respiratory  variability, suggesting right atrial pressure of 15 mmHg. IAS/Shunts: The interatrial septum was not well visualized. Additional Comments: Spectral Doppler performed. Color Doppler performed.  LEFT VENTRICLE PLAX 2D LVIDd:         3.70 cm LVIDs:         2.40 cm LV PW:         1.40 cm LV IVS:        1.30 cm  LEFT ATRIUM         Index LA diam:    4.00 cm 2.24 cm/m  AORTIC VALVE AV Vmax:      300.00 cm/s AV Peak Grad: 36.0 mmHg MITRAL VALVE                TRICUSPID VALVE MV Area (PHT): 3.77 cm     TR Peak grad:   49.0 mmHg MV Decel Time: 201 msec     TR Vmax:        350.00 cm/s MR Peak grad: 113.2 mmHg MR Vmax:      532.00 cm/s MV E velocity: 113.00 cm/s MV A velocity: 84.80 cm/s MV E/A ratio:  1.33 Vishnu Priya Mallipeddi Electronically signed by Winfield Rast Mallipeddi Signature Date/Time: 05/20/2023/1:55:43 PM    Final    DG Chest Port 1 View  Result Date: 05/20/2023 CLINICAL DATA:  Sepsis EXAM: PORTABLE CHEST 1 VIEW COMPARISON:  04/06/2023 FINDINGS: Symmetric pulmonary insufflation. Moderate perihilar and lower lung zone interstitial pulmonary infiltrate is present most in keeping with moderate cardiogenic failure. Small bilateral, left greater than right, pleural effusions are present. No pneumothorax. Cardiac size is enlarged, unchanged. No acute bone abnormality. IMPRESSION: 1. Moderate cardiogenic failure. Electronically Signed   By: Helyn Numbers M.D.   On: 05/20/2023 03:35   CT Head Wo Contrast  Result Date: 05/20/2023 CLINICAL DATA:  Mental status change,  unknown cause EXAM: CT HEAD WITHOUT CONTRAST TECHNIQUE: Contiguous axial images were obtained from the base of the skull through the vertex without intravenous contrast. RADIATION DOSE REDUCTION: This exam was performed according to the departmental dose-optimization program which includes automated exposure control, adjustment of the mA and/or kV according to patient size and/or use of iterative reconstruction technique. COMPARISON:  04/14/2023 FINDINGS: Brain: Normal anatomic configuration. Parenchymal volume loss is commensurate with the patient's age. Stable moderate periventricular white matter changes are present likely reflecting the sequela of small vessel ischemia. No abnormal intra or extra-axial mass lesion or fluid collection. No abnormal mass effect or midline shift. No evidence of acute intracranial hemorrhage or infarct. Ventricular size is normal. Cerebellum unremarkable. Vascular: No asymmetric hyperdense vasculature at the skull base. Skull: Intact Sinuses/Orbits: Paranasal sinuses are clear. Orbits are unremarkable. Other: Mastoid air cells and middle ear cavities are clear. IMPRESSION: 1. No acute intracranial hemorrhage or infarct. 2. Stable senescent change. Electronically Signed   By: Helyn Numbers M.D.   On: 05/20/2023 03:26   Medications: Infusions:  azithromycin     cefTRIAXone (ROCEPHIN)  IV      Scheduled Medications:  arformoterol  15 mcg Nebulization BID   budesonide (PULMICORT) nebulizer solution  0.5 mg Nebulization BID   Chlorhexidine Gluconate Cloth  6 each Topical Daily   heparin  5,000 Units Subcutaneous Q8H   isosorbide mononitrate  30 mg Oral Daily   levothyroxine  100 mcg Oral QAC breakfast   methylPREDNISolone (SOLU-MEDROL) injection  40 mg Intravenous Q12H   metoprolol succinate  25 mg Oral Daily   mouth rinse  15 mL Mouth Rinse 4 times per  day   pantoprazole (PROTONIX) IV  40 mg Intravenous Q24H   pravastatin  10 mg Oral QPM   revefenacin  175 mcg  Nebulization Daily   sodium zirconium cyclosilicate  10 g Oral Daily    have reviewed scheduled and prn medications.  Physical Exam: General:  awake on bipap-  repeating same phrase  Heart:RRR Lungs: poor air movement Abdomen: soft, non tender Extremities: trace edema     05/21/2023,8:06 AM  LOS: 1 day

## 2023-05-22 DIAGNOSIS — R509 Fever, unspecified: Secondary | ICD-10-CM | POA: Diagnosis not present

## 2023-05-22 DIAGNOSIS — E871 Hypo-osmolality and hyponatremia: Secondary | ICD-10-CM | POA: Diagnosis not present

## 2023-05-22 DIAGNOSIS — R627 Adult failure to thrive: Secondary | ICD-10-CM | POA: Diagnosis not present

## 2023-05-22 DIAGNOSIS — D61818 Other pancytopenia: Secondary | ICD-10-CM | POA: Diagnosis not present

## 2023-05-22 LAB — BASIC METABOLIC PANEL
Anion gap: 6 (ref 5–15)
Anion gap: 7 (ref 5–15)
BUN: 22 mg/dL (ref 8–23)
BUN: 23 mg/dL (ref 8–23)
CO2: 29 mmol/L (ref 22–32)
CO2: 28 mmol/L (ref 22–32)
Calcium: 8.4 mg/dL — ABNORMAL LOW (ref 8.9–10.3)
Calcium: 8.2 mg/dL — ABNORMAL LOW (ref 8.9–10.3)
Chloride: 96 mmol/L — ABNORMAL LOW (ref 98–111)
Chloride: 97 mmol/L — ABNORMAL LOW (ref 98–111)
Creatinine, Ser: 1.15 mg/dL — ABNORMAL HIGH (ref 0.44–1.00)
Creatinine, Ser: 1.14 mg/dL — ABNORMAL HIGH (ref 0.44–1.00)
GFR, Estimated: 46 mL/min — ABNORMAL LOW (ref >60)
GFR, Estimated: 47 mL/min — ABNORMAL LOW (ref >60)
Glucose, Bld: 123 mg/dL — ABNORMAL HIGH (ref 70–99)
Glucose, Bld: 137 mg/dL — ABNORMAL HIGH (ref 70–99)
Potassium: 5 mmol/L (ref 3.5–5.1)
Potassium: 4.1 mmol/L (ref 3.5–5.1)
Sodium: 131 mmol/L — ABNORMAL LOW (ref 135–145)
Sodium: 132 mmol/L — ABNORMAL LOW (ref 135–145)

## 2023-05-22 LAB — BLOOD GAS, VENOUS
Acid-Base Excess: 5 mmol/L — ABNORMAL HIGH (ref 0.0–2.0)
Bicarbonate: 31 mmol/L — ABNORMAL HIGH (ref 20.0–28.0)
Drawn by: 27160
O2 Saturation: 93.7 %
Patient temperature: 36.8
pCO2, Ven: 50 mmHg (ref 44–60)
pH, Ven: 7.4 (ref 7.25–7.43)
pO2, Ven: 55 mmHg — ABNORMAL HIGH (ref 32–45)

## 2023-05-22 LAB — CULTURE, BLOOD (ROUTINE X 2): Culture: NO GROWTH

## 2023-05-22 LAB — CBC
HCT: 26.5 % — ABNORMAL LOW (ref 36.0–46.0)
Hemoglobin: 7.3 g/dL — ABNORMAL LOW (ref 12.0–15.0)
MCH: 28.6 pg (ref 26.0–34.0)
MCHC: 27.5 g/dL — ABNORMAL LOW (ref 30.0–36.0)
MCV: 103.9 fL — ABNORMAL HIGH (ref 80.0–100.0)
Platelets: 75 10*3/uL — ABNORMAL LOW (ref 150–400)
RBC: 2.55 MIL/uL — ABNORMAL LOW (ref 3.87–5.11)
RDW: 22.8 % — ABNORMAL HIGH (ref 11.5–15.5)
WBC: 3 10*3/uL — ABNORMAL LOW (ref 4.0–10.5)
nRBC: 16.6 % — ABNORMAL HIGH (ref 0.0–0.2)

## 2023-05-22 MED ORDER — FUROSEMIDE 10 MG/ML IJ SOLN
40.0000 mg | Freq: Once | INTRAMUSCULAR | Status: AC
  Administered 2023-05-22: 40 mg via INTRAVENOUS
  Filled 2023-05-22: qty 4

## 2023-05-22 MED ORDER — METOPROLOL TARTRATE 5 MG/5ML IV SOLN
2.5000 mg | Freq: Once | INTRAVENOUS | Status: AC
  Administered 2023-05-22: 2.5 mg via INTRAVENOUS
  Filled 2023-05-22: qty 5

## 2023-05-22 MED ORDER — MIRTAZAPINE 15 MG PO TABS
15.0000 mg | ORAL_TABLET | Freq: Every day | ORAL | Status: DC
  Administered 2023-05-22 – 2023-05-27 (×6): 15 mg via ORAL
  Filled 2023-05-22 (×6): qty 1

## 2023-05-22 MED ORDER — STERILE WATER FOR INJECTION IJ SOLN
INTRAMUSCULAR | Status: AC
  Administered 2023-05-22: 10 mL
  Filled 2023-05-22: qty 10
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
## 2023-05-22 NOTE — Hospital Course (Signed)
89 y.o. female with medical history significant for COPD c/b chronic hypoxic respiratory failure on 2L baseline, HFpEF, CKD3b, Paroxysmal Afib, and MDS/CMML c/b chronic neutropenia and thrombocytopenia, and chronic hyperkalemia, who presented with weakness and was admitted for acute on chronic hypoxic and hypercapnic respiratory failure and COPD exacerbation.   Lisa [name] was recently admitted 6/4-6/7 for an NSTEMI, though her course was also complicated by acute blood loss anemia (discharged with PPI BID per GI) and hyperkalemia (discharged with every other day [facility]).   On 7/18, Lisa [name] presented after EMS was called by her family because she had become progressively weaker over the prior 2 days. Also endorsed decreased appetite, increased confusion, sleeping throughout the day, and ultimately found to have increased O2 requirement after family checked O2 sats in 70s after Lisa [name] collapsed to the floor.   On presentation to ED, Lisa [name] was advanced to 6L to maintain O2 sats >90%, duonebs, and NaHCO3 after ABG showed 7.23/65/46/26. Subsequent ABG worsened to 7.22/72/<31/30 and she was moved to BiPAP as she was brought into ICU. Her K was also elevated at 6.5, without EKG changes, given Calcium gluconate, Lokelma 5g and 2x 1L LR boluses. 7/18 CXR c/w moderate perihilar and lower lobe interstitial pulmonary edema. BNP elevated at 1095 with a normal lactate. Also found to have stable on chronic neutropenia with ANC 200, and stable on chronic thrombocytopenia with PLT at 101k. Temp has fluctuated 97.2 to 101.4. Her 7/18 CT Head revealed no acute intracranial process.     Over the course of afternoon and night of 7/18, consulted PCCM for BiPAP settings. Continued to manage K with Lasix and Lokelma. This morning, 7/19, K is at 6.0 and Na has improved to 128. Per PCCM recs, will trial pt off of Bipap today, repeat CXR, and continue IV Lasix 40mg . Nephrology adding fludrocortisone and increasing Lokelma  to BID for hyperkalemia.

## 2023-05-22 NOTE — Progress Notes (Addendum)
PROGRESS NOTE   Lisa [name]  IHK:[id number] DOB: Feb 26, 1934 DOA: 05/20/2023 PCP: [name], MD   Chief Complaint  Patient presents with   Altered Mental Status   Level of care: Stepdown  Brief Admission History:  89 y.o. female with medical history significant for COPD c/b chronic hypoxic respiratory failure on 2L baseline, HFpEF, CKD3b, Paroxysmal Afib, and MDS/CMML c/b chronic neutropenia and thrombocytopenia, and chronic hyperkalemia, who presented with weakness and was admitted for acute on chronic hypoxic and hypercapnic respiratory failure and COPD exacerbation.   Lisa [name] was recently admitted 6/4-6/7 for an NSTEMI, though her course was also complicated by acute blood loss anemia (discharged with PPI BID per GI) and hyperkalemia (discharged with every other day [facility]).   On 7/18, Lisa [name] presented after EMS was called by her family because she had become progressively weaker over the prior 2 days. Also endorsed decreased appetite, increased confusion, sleeping throughout the day, and ultimately found to have increased O2 requirement after family checked O2 sats in 70s after Lisa [name] collapsed to the floor.   On presentation to ED, Lisa [name] was advanced to 6L to maintain O2 sats >90%, duonebs, and NaHCO3 after ABG showed 7.23/65/46/26. Subsequent ABG worsened to 7.22/72/<31/30 and she was moved to BiPAP as she was brought into ICU. Her K was also elevated at 6.5, without EKG changes, given Calcium gluconate, Lokelma 5g and 2x 1L LR boluses. 7/18 CXR c/w moderate perihilar and lower lobe interstitial pulmonary edema. BNP elevated at 1095 with a normal lactate. Also found to have stable on chronic neutropenia with ANC 200, and stable on chronic thrombocytopenia with PLT at 101k. Temp has fluctuated 97.2 to 101.4. Her 7/18 CT Head revealed no acute intracranial process.     Over the course of afternoon and night of 7/18, consulted PCCM for BiPAP settings. Continued to manage  K with Lasix and Lokelma. This morning, 7/19, K is at 6.0 and Na has improved to 128. Per PCCM recs, will trial pt off of Bipap today, repeat CXR, and continue IV Lasix 40mg . Nephrology adding fludrocortisone and increasing Lokelma to BID for hyperkalemia.    Assessment and Plan:  Acute on chronic hypoxic and hypercapnic respiratory failure c/b COPD Exacerbation, ?CAP Pt presented with ABG 7.23/65/46/27, with increased supplemental O2 requirement from 2L baseline to 4L in ED. 7/18 CXR with moderate perihilar lower lobe interstitial infiltrates c/f pulmonary edema. Given her recent NSTEMI last month, the CXR, and BNP of 1095, was suspicious of acute HFpEF exacerbation, but not matching clinical exam and pt handled initial 1L LR boluses in ED well. 7/19 Echo with preserved EF however did demonstrate severe pulmonary artery hypertension.  Appreciate PCCM recs, pt on Bipap overnight and will trial off Bipap for a few hours today, following recs. Continuing with IV 40mg  Lasix, and repeat CXR today.  - Ceftriaxone and Azithromycin initiated 7/19  - Repeat CXR with bilateral pulmonary infiltrates and pleural effusions - plan to give another does of IV Lasix 40mg  today  - Breo Ellipta and Incruse Ellipta daily - Arfomoterol nebs BID - Pulmicort 0.5 BID - IV Methylprednisolone 40mg  BID initiated 7/19 (IV Protonix 40mg  daily) - Revefenacin nebs daily - Duoneb QID prn - Continue supplemental O2   Hyperkalemia - improving Presented with K at 6.5, acute on chronic, and takes Lokelma every other day at home since her last discharge in June. Has continued to be elevated despite Lasix, Lokelma, Insulin with D50, but better on last  recheck at 6.0. Repeat EKGs without T wave changes. Appreciate Nephrology recs. Increasing IV Lasix 40mg  to BID today, and increasing Lokelma to BID as well. 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 - IV Lasix 40mg  today - follow BMP  - Lokelma per  nephrology   Symptomatic Hyponatremia Presented with Na at 122, and also appears to be an acute on chronic problem 2/2 poor PO intake, which has been worse over the last two days, per son. Pt presents with son who endorses concern of increased confusion over last two days as well. Less confused today, 7/19. Goal Na by 7/19 AM was 128-130, which was met this AM with Na at 128. Urine sodium 75 with 275 urine osm, not appearing as SIADH, nor adrenal insufficiency given morning cortisol wnl. Also improved alongside lasix, perhaps volume component was present, will continue diuresis as above. Could be Type IV RTA. - slowly improving, Will continue to monitor - Consult to nephrology appreciated   Acute HFpEF exacerbation, and HTN 7/18 CXR with moderate perihilar lower lobe interstitial infiltrates c/f pulmonary edema. Given her recent NSTEMI last month, the CXR, and BNP of 1095, originally suspected acute HFpEF exacerbation. While pt is not hypervolemic on exam, cannot completely exclude acute exacerbation. However, she received 2x 1L LR boluses in ED overnight and appeared to handle that well. 7/18 Echo with preserved EF, but severe pulmonary hypertension. Continuing IV Lasix 40mg  today.  - Continue to monitor closely - Imdur 30 mg daily - Metoprolol succinate 25mg     Neutropenia iso MDS/CMML-2 Presents with ANC of 200, chronic since September 2022. Her temperature fluctuated widely in the ED reports, from 97.2 up to one time febrile range at 101.4. No current infectious symptoms. UA clear, and comparing CXR with symptoms not concerning for PNA. Reviewed 6/27 Bone Marrow biopsy report which was consistent with Chronic Myelomonocytic Leukemia-2 with hypercellular bone marrow with increased blasts at 10-15%. Appears she may not have seen her heme/onc Dr. Kirtland Bouchard since those results.  - Will continue to monitor   Thrombocytopenia - chronic Presents with PLT at 101k, and has fluctuated between 52k and 105k since  at least Fall 2023. PLT in 70ks today without evidence of bleeding. - Will continue to monitor   Recent GI Bleed Presenting Hgb at 7.8, down from 9.2 on 6/27, and 10.0 on 6/12. Last admission 6/4-6/7 was complicated by guaiac positive stool necessitating transfusion, and GI consult. Will check FOBT and consult GI if necessary. - Added FOBT - Continue Protonix 40mg  BID for GI protection   CAD Recent NSTEMI early June 2024 with admission. No symptoms presently. - Continue Pravastatin 10mg  qhs   Paroxysmal Afib Not a candidate for anticoagulation. Presenting EKG with NSR. - Continue Metoprolol succinate 25mg  daily   Hypothyroidism - Continue Levothyroxine 100 mcg daily    DVT prophylaxis: [place] heparin Code Status: Full  Family Communication: grandson POA at bedside  Disposition: Status is: Inpatient   Consultants:  PCCM nephrology  Procedures:   Antimicrobials:  Ceftiaxone  Azithromycin    Subjective: Pt increasingly confused, difficult to keep on bipap for prolonged periods;   Objective: Vitals:   05/22/23 0736 05/22/23 0800 05/22/23 0900 05/22/23 1000  BP:  (!) 149/61 (!) 145/53 (!) 184/72  Pulse:  69 63 67  Resp:  (!) 24 (!) 23 (!) 24  Temp: 97.6 F (36.4 C)     TempSrc: Axillary     SpO2:  97% 98% 98%  Weight:  Height:        Intake/Output Summary (Last 24 hours) at 05/22/2023 1105 Last data filed at 05/22/2023 1032 Gross per 24 hour  Intake 830 ml  Output 2950 ml  Net -2120 ml   Filed Weights   05/20/23 0237 05/22/23 0500  Weight: 75.3 kg 75 kg   Examination:  General exam: on bipap, confused, agitated at times,   Respiratory system: poor air movement; crackles heard at base  Cardiovascular system: normal S1 & S2 heard. No JVD, murmurs, rubs, gallops or clicks. No pedal edema. Gastrointestinal system: Abdomen is nondistended, soft and nontender. No organomegaly or masses felt. Normal bowel sounds heard. Central nervous system: Alert but confused.  No focal neurological deficits. Extremities: Symmetric 5 x 5 power. Skin: No rashes, lesions or ulcers. Psychiatry: Judgement and insight appear poor with confusion. Mood & affect appropriate.   Data Reviewed: I have personally reviewed following labs and imaging studies  CBC: Recent Labs  Lab 05/20/23 0245 05/21/23 0404 05/22/23 0435  WBC 5.5 4.4 3.0*  NEUTROABS 0.2*  --   --   HGB 7.8* 7.3* 7.3*  HCT 27.6* 26.7* 26.5*  MCV 103.0* 104.7* 103.9*  PLT 101* 79* 75*    Basic Metabolic Panel: Recent Labs  Lab 05/20/23 1450 05/20/23 1938 05/20/23 2328 05/21/23 0404 05/21/23 0814 05/21/23 1641 05/22/23 0435  NA 125* 126*  124* 127* 126* 128* 130* 131*  K 6.7* 6.0* 5.7* 5.9* 6.0* 6.0* 5.0  CL 94* 96*  --  96*  --  96* 96*  CO2 25 21*  --  24  --  28 29  GLUCOSE 96 99  --  109*  --  112* 123*  BUN 16 16  --  17  --  19 22  CREATININE 1.35* 1.39*  --  1.22*  --  1.17* 1.15*  CALCIUM 8.1* 8.2*  --  7.9*  --  8.5* 8.4*  MG  --   --   --  2.1  --   --   --     CBG: Recent Labs  Lab 05/20/23 2321 05/21/23 0052 05/21/23 0328  GLUCAP 97 97 110*    Recent Results (from the past 240 hour(s))  Resp panel by RT-PCR (RSV, Flu A&B, Covid) Anterior Nasal Swab     Status: None   Collection Time: 05/20/23  2:45 AM   Specimen: Anterior Nasal Swab  Result Value Ref Range Status   SARS Coronavirus 2 by RT PCR NEGATIVE NEGATIVE Final    Comment: (NOTE) SARS-CoV-2 target nucleic acids are NOT DETECTED.  The SARS-CoV-2 RNA is generally detectable in upper respiratory specimens during the acute phase of infection. The lowest concentration of SARS-CoV-2 viral copies this assay can detect is 138 copies/mL. A negative result does not preclude SARS-Cov-2 infection and should not be used as the sole basis for treatment or other patient management decisions. A negative result may occur with  improper specimen collection/handling, submission of specimen other than nasopharyngeal swab,  presence of viral mutation(s) within the areas targeted by this assay, and inadequate number of viral copies(<138 copies/mL). A negative result must be combined with clinical observations, patient history, and epidemiological information. The expected result is Negative.  Fact Sheet for Patients:  BloggerCourse.com  Fact Sheet for Healthcare Providers:  SeriousBroker.it  This test is no t yet approved or cleared by the [place] FDA and  has been authorized for detection and/or diagnosis of SARS-CoV-2 by FDA under an Emergency Use Authorization (EUA). This EUA  will remain  in effect (meaning this test can be used) for the duration of the COVID-19 declaration under Section 564(b)(1) of the Act, 21 U.S.C.section 360bbb-3(b)(1), unless the authorization is terminated  or revoked sooner.       Influenza A by PCR NEGATIVE NEGATIVE Final   Influenza B by PCR NEGATIVE NEGATIVE Final    Comment: (NOTE) The Xpert Xpress SARS-CoV-2/FLU/RSV plus assay is intended as an aid in the diagnosis of influenza from Nasopharyngeal swab specimens and should not be used as a sole basis for treatment. Nasal washings and aspirates are unacceptable for Xpert Xpress SARS-CoV-2/FLU/RSV testing.  Fact Sheet for Patients: BloggerCourse.com  Fact Sheet for Healthcare Providers: SeriousBroker.it  This test is not yet approved or cleared by the [place] FDA and has been authorized for detection and/or diagnosis of SARS-CoV-2 by FDA under an Emergency Use Authorization (EUA). This EUA will remain in effect (meaning this test can be used) for the duration of the COVID-19 declaration under Section 564(b)(1) of the Act, 21 U.S.C. section 360bbb-3(b)(1), unless the authorization is terminated or revoked.     Resp Syncytial Virus by PCR NEGATIVE NEGATIVE Final    Comment: (NOTE) Fact Sheet for  Patients: BloggerCourse.com  Fact Sheet for Healthcare Providers: SeriousBroker.it  This test is not yet approved or cleared by the [place] FDA and has been authorized for detection and/or diagnosis of SARS-CoV-2 by FDA under an Emergency Use Authorization (EUA). This EUA will remain in effect (meaning this test can be used) for the duration of the COVID-19 declaration under Section 564(b)(1) of the Act, 21 U.S.C. section 360bbb-3(b)(1), unless the authorization is terminated or revoked.  Performed at [facility], [address] [place], [place] [address]   Blood Culture (routine x 2)     Status: None (Preliminary result)   Collection Time: 05/20/23  2:45 AM   Specimen: Left Antecubital; Blood  Result Value Ref Range Status   Specimen Description LEFT ANTECUBITAL  Final   Special Requests   Final    BOTTLES DRAWN AEROBIC AND ANAEROBIC Blood Culture adequate volume   Culture   Final    NO GROWTH 2 DAYS Performed at [facility], [address], [place], [place] [address]    Report Status PENDING  Incomplete  Blood Culture (routine x 2)     Status: None (Preliminary result)   Collection Time: 05/20/23  2:50 AM   Specimen: BLOOD RIGHT HAND  Result Value Ref Range Status   Specimen Description BLOOD RIGHT HAND  Final   Special Requests   Final    BOTTLES DRAWN AEROBIC AND ANAEROBIC Blood Culture adequate volume   Culture   Final    NO GROWTH 2 DAYS Performed at [facility], [address], [place], [place] [address]    Report Status PENDING  Incomplete  MRSA Next Gen by PCR, Nasal     Status: None   Collection Time: 05/20/23 12:45 PM   Specimen: Nasal Mucosa; Nasal Swab  Result Value Ref Range Status   MRSA by PCR Next Gen NOT DETECTED NOT DETECTED Final    Comment: (NOTE) The GeneXpert MRSA Assay (FDA approved for NASAL specimens only), is one component of a comprehensive MRSA colonization surveillance program. It is  not intended to diagnose MRSA infection nor to guide or monitor treatment for MRSA infections. Test performance is not FDA approved in patients less than 39 years old. Performed at [facility], [address], [place], [place] [address]  Radiology Studies: DG Chest Port 1 View  Result Date: 05/21/2023 CLINICAL DATA:  COPD exacerbation. EXAM: PORTABLE CHEST 1 VIEW COMPARISON:  05/20/2023 FINDINGS: Heart margins are partially obscured by opacity at the LEFT lung base, similar in appearance to prior study. Patchy infiltrates are seen throughout the lungs bilaterally and appear similar. Bilateral pleural effusions are present, LEFT greater than RIGHT. IMPRESSION: Bilateral pulmonary infiltrates and pleural effusions. Electronically Signed   By: Norva Pavlov M.D.   On: 05/21/2023 14:27   ECHOCARDIOGRAM LIMITED  Result Date: 05/20/2023    ECHOCARDIOGRAM LIMITED REPORT   Patient Name:   Tiann Alcaide Date of Exam: 05/20/2023 Medical Rec #:  [id number]      Height:       63.0 in Accession #:    [id number]     Weight:       166.0 lb Date of Birth:  03-30-1934      BSA:          1.786 m Patient Age:    89 years       BP:           111/57 mmHg Patient Gender: F              HR:           66 bpm. [facility] Procedure: Limited Echo, Cardiac Doppler and Limited Color Doppler Indications:    Bradycardia [[id number]]  History:        Patient has prior history of Echocardiogram examinations, most                 recent 04/07/2023. Previous Myocardial Infarction, CAP; Risk                 Factors:Former Smoker and Hypertension. Breast, Lung Cancer.  Sonographer:    Aron Baba Referring Phys: [id number] Cleora Fleet  Sonographer Comments: Image acquisition challenging due to patient behavioral factors. and Image acquisition challenging due to uncooperative patient. IMPRESSIONS  1. Limited Echo for LVEF assessment.  2. Left ventricular ejection fraction, by estimation, is 60 to 65%. The left ventricle  has normal function. The left ventricle has no regional wall motion abnormalities. There is moderate left ventricular hypertrophy. Left ventricular diastolic function  could not be evaluated.  3. Right ventricular systolic function is normal. The right ventricular size is not well visualized. There is severely elevated pulmonary artery systolic pressure.  4. The mitral valve is normal in structure. Mild mitral valve regurgitation.  5. The tricuspid valve is abnormal. Tricuspid valve regurgitation is moderate to severe.  6. The aortic valve was not assessed. Aortic valve regurgitation is not visualized. Moderate aortic valve stenosis. Aortic valve Vmax measures 3.00 m/s.  7. The inferior vena cava is dilated in size with <50% respiratory variability, suggesting right atrial pressure of 15 mmHg. Comparison(s): Changes from prior study are noted. Functional moderate to severe TR is new. FINDINGS  Left Ventricle: Left ventricular ejection fraction, by estimation, is 60 to 65%. The left ventricle has normal function. The left ventricle has no regional wall motion abnormalities. The left ventricular internal cavity size was normal in size. There is  moderate left ventricular hypertrophy. Left ventricular diastolic function could not be evaluated. Left ventricular diastolic function could not be evaluated due to nondiagnostic images. Right Ventricle: The right ventricular size is not well visualized. Right vetricular wall thickness was not assessed. Right ventricular systolic function is normal. There is severely elevated pulmonary artery systolic pressure. The tricuspid  regurgitant velocity is 3.50 m/s, and with an assumed right atrial pressure of 15 mmHg, the estimated right ventricular systolic pressure is 64.0 mmHg. Left Atrium: Left atrial size was not assessed. Right Atrium: Right atrial size was severely dilated. Pericardium: There is no evidence of pericardial effusion. Mitral Valve: The mitral valve is normal in  structure. Mild mitral valve regurgitation. Tricuspid Valve: The tricuspid valve is abnormal. Tricuspid valve regurgitation is moderate to severe. Aortic Valve: The aortic valve was not assessed. Aortic valve regurgitation is not visualized. Moderate aortic stenosis is present. Aortic valve peak gradient measures 36.0 mmHg. Pulmonic Valve: The pulmonic valve was not assessed. Aorta: The aortic root is normal in size and structure. Venous: The inferior vena cava is dilated in size with less than 50% respiratory variability, suggesting right atrial pressure of 15 mmHg. IAS/Shunts: The interatrial septum was not well visualized. Additional Comments: Spectral Doppler performed. Color Doppler performed.  LEFT VENTRICLE PLAX 2D LVIDd:         3.70 cm LVIDs:         2.40 cm LV PW:         1.40 cm LV IVS:        1.30 cm  LEFT ATRIUM         Index LA diam:    4.00 cm 2.24 cm/m  AORTIC VALVE AV Vmax:      300.00 cm/s AV Peak Grad: 36.0 mmHg MITRAL VALVE                TRICUSPID VALVE MV Area (PHT): 3.77 cm     TR Peak grad:   49.0 mmHg MV Decel Time: 201 msec     TR Vmax:        350.00 cm/s MR Peak grad: 113.2 mmHg MR Vmax:      532.00 cm/s MV E velocity: 113.00 cm/s MV A velocity: 84.80 cm/s MV E/A ratio:  1.33 Vishnu Priya Mallipeddi Electronically signed by Winfield Rast Mallipeddi Signature Date/Time: 05/20/2023/1:55:43 PM    Final     Scheduled Meds:  arformoterol  15 mcg Nebulization BID   budesonide (PULMICORT) nebulizer solution  0.5 mg Nebulization BID   Chlorhexidine Gluconate Cloth  6 each Topical Daily   fludrocortisone  0.1 mg Oral Daily   heparin  5,000 Units Subcutaneous Q8H   isosorbide mononitrate  30 mg Oral Daily   levothyroxine  100 mcg Oral QAC breakfast   methylPREDNISolone (SOLU-MEDROL) injection  40 mg Intravenous Q12H   metoprolol succinate  25 mg Oral Daily   mouth rinse  15 mL Mouth Rinse 4 times per day   pantoprazole (PROTONIX) IV  40 mg Intravenous Q24H   pravastatin  10 mg Oral QPM    revefenacin  175 mcg Nebulization Daily   sodium zirconium cyclosilicate  10 g Oral BID   Continuous Infusions:  azithromycin 500 mg (05/22/23 1019)   cefTRIAXone (ROCEPHIN)  IV Stopped (05/21/23 2158)    LOS: 2 days   Time spent: 47 mins  Neasia Fleeman Laural Benes, MD How to contact the [facility] Attending or Consulting provider 7A - 7P or covering provider during after hours 7P -7A, for this patient?  Check the care team in [facility] and look for a) attending/consulting [facility] provider listed and b) the [facility] team listed Log into [email or web address] and use [place]'s universal password to access. If you do not have the password, please contact the hospital operator. Locate the [facility] provider you are looking for under [facility] and page to  a number that you can be directly reached. If you still have difficulty reaching the provider, please page the [facility] (Director on Call) for the Hospitalists listed on amion for assistance.  05/22/2023, 11:05 AM

## 2023-05-22 NOTE — Progress Notes (Addendum)
2000 patient alert x3 able to make needs known disorientation on why she's here and plan of care on 3L [place] decreased to 2L [place] family at bedside 2200 BIPAP placed on patient  2207 patietn noted to be in AFIB HR 140s BP WNL patient has no asymptomatic at this time 2230 2.5mg  lopressor given 2305 patient confused keeps taking BIPAP off SP02 decreasing eduction not helping at this  Time 2340 BIPAP taken off by patient and refusing to place it back on says she can't sleep with it on.  placed back on patient at this time.

## 2023-05-22 NOTE — Progress Notes (Signed)
Placed patient on Bipap after 11 pm, checked on patient at 2 am and patient was still wearing Bipap and sleeping peacefully.

## 2023-05-22 NOTE — Plan of Care (Signed)
  Problem: Health Behavior/Discharge Planning: Goal: Ability to manage health-related needs will improve Outcome: Not Progressing   Problem: Clinical Measurements: Goal: Ability to maintain clinical measurements within normal limits will improve Outcome: Not Progressing Goal: Will remain free from infection Outcome: Not Progressing Goal: Diagnostic test results will improve Outcome: Not Progressing Goal: Respiratory complications will improve Outcome: Not Progressing Goal: Cardiovascular complication will be avoided Outcome: Not Progressing   Problem: Activity: Goal: Risk for activity intolerance will decrease Outcome: Not Progressing   Problem: Nutrition: Goal: Adequate nutrition will be maintained Outcome: Not Progressing   Problem: Coping: Goal: Level of anxiety will decrease Outcome: Not Progressing   Problem: Elimination: Goal: Will not experience complications related to bowel motility Outcome: Not Progressing Goal: Will not experience complications related to urinary retention Outcome: Not Progressing   Problem: Pain Managment: Goal: General experience of comfort will improve Outcome: Not Progressing   Problem: Safety: Goal: Ability to remain free from injury will improve Outcome: Not Progressing   Problem: Skin Integrity: Goal: Risk for impaired skin integrity will decrease Outcome: Not Progressing   Problem: Education: Goal: Knowledge of disease or condition will improve Outcome: Not Progressing Goal: Understanding of medication regimen will improve Outcome: Not Progressing Goal: Individualized Educational Video(s) Outcome: Not Progressing   Problem: Activity: Goal: Ability to tolerate increased activity will improve Outcome: Not Progressing   Problem: Health Behavior/Discharge Planning: Goal: Ability to safely manage health-related needs after discharge will improve Outcome: Not Progressing   Problem: Cardiac: Goal: Ability to achieve and  maintain adequate cardiopulmonary perfusion will improve Outcome: Not Progressing Patient is total care that doesn't understand the plan of care or what is needed to improve to go home patient more confused at night new onset AFIB RVR, patient keeps taking off BIPAP

## 2023-05-23 ENCOUNTER — Inpatient Hospital Stay (HOSPITAL_COMMUNITY): Payer: Medicare Other

## 2023-05-23 DIAGNOSIS — E871 Hypo-osmolality and hyponatremia: Secondary | ICD-10-CM | POA: Diagnosis not present

## 2023-05-23 DIAGNOSIS — R627 Adult failure to thrive: Secondary | ICD-10-CM | POA: Diagnosis not present

## 2023-05-23 DIAGNOSIS — D61818 Other pancytopenia: Secondary | ICD-10-CM | POA: Diagnosis not present

## 2023-05-23 DIAGNOSIS — R509 Fever, unspecified: Secondary | ICD-10-CM | POA: Diagnosis not present

## 2023-05-23 LAB — BASIC METABOLIC PANEL
Anion gap: 9 (ref 5–15)
BUN: 22 mg/dL (ref 8–23)
CO2: 27 mmol/L (ref 22–32)
Calcium: 8.5 mg/dL — ABNORMAL LOW (ref 8.9–10.3)
Chloride: 97 mmol/L — ABNORMAL LOW (ref 98–111)
Creatinine, Ser: 1.03 mg/dL — ABNORMAL HIGH (ref 0.44–1.00)
GFR, Estimated: 53 mL/min — ABNORMAL LOW (ref >60)
Glucose, Bld: 131 mg/dL — ABNORMAL HIGH (ref 70–99)
Potassium: 3.6 mmol/L (ref 3.5–5.1)
Sodium: 133 mmol/L — ABNORMAL LOW (ref 135–145)

## 2023-05-23 LAB — CBC
HCT: 27.1 % — ABNORMAL LOW (ref 36.0–46.0)
HCT: 27.9 % — ABNORMAL LOW (ref 36.0–46.0)
Hemoglobin: 7.7 g/dL — ABNORMAL LOW (ref 12.0–15.0)
Hemoglobin: 7.8 g/dL — ABNORMAL LOW (ref 12.0–15.0)
MCH: 28.9 pg (ref 26.0–34.0)
MCH: 28.8 pg (ref 26.0–34.0)
MCHC: 28.4 g/dL — ABNORMAL LOW (ref 30.0–36.0)
MCHC: 28 g/dL — ABNORMAL LOW (ref 30.0–36.0)
MCV: 101.9 fL — ABNORMAL HIGH (ref 80.0–100.0)
MCV: 103 fL — ABNORMAL HIGH (ref 80.0–100.0)
Platelets: 68 10*3/uL — ABNORMAL LOW (ref 150–400)
Platelets: 69 10*3/uL — ABNORMAL LOW (ref 150–400)
RBC: 2.66 MIL/uL — ABNORMAL LOW (ref 3.87–5.11)
RBC: 2.71 MIL/uL — ABNORMAL LOW (ref 3.87–5.11)
RDW: 23.2 % — ABNORMAL HIGH (ref 11.5–15.5)
RDW: 23.7 % — ABNORMAL HIGH (ref 11.5–15.5)
WBC: 2.1 10*3/uL — ABNORMAL LOW (ref 4.0–10.5)
WBC: 3 10*3/uL — ABNORMAL LOW (ref 4.0–10.5)
nRBC: 47.4 % — ABNORMAL HIGH (ref 0.0–0.2)
nRBC: 45.5 % — ABNORMAL HIGH (ref 0.0–0.2)

## 2023-05-23 LAB — GLUCOSE, CAPILLARY
Glucose-Capillary: 117 mg/dL — ABNORMAL HIGH (ref 70–99)
Glucose-Capillary: 119 mg/dL — ABNORMAL HIGH (ref 70–99)

## 2023-05-23 LAB — AMMONIA: Ammonia: 24 umol/L (ref 9–35)

## 2023-05-23 MED ORDER — IPRATROPIUM-ALBUTEROL 0.5-2.5 (3) MG/3ML IN SOLN
3.0000 mL | RESPIRATORY_TRACT | Status: DC | PRN
  Administered 2023-05-24: 3 mL via RESPIRATORY_TRACT
  Filled 2023-05-23: qty 3

## 2023-05-23 MED ORDER — METHYLPREDNISOLONE SODIUM SUCC 40 MG IJ SOLR: 40.0000 mg | Freq: Every day | INTRAMUSCULAR | Status: DC

## 2023-05-23 MED ORDER — IPRATROPIUM-ALBUTEROL 0.5-2.5 (3) MG/3ML IN SOLN
3.0000 mL | Freq: Four times a day (QID) | RESPIRATORY_TRACT | Status: DC
  Administered 2023-05-23 – 2023-05-24 (×4): 3 mL via RESPIRATORY_TRACT
  Filled 2023-05-23 (×4): qty 3

## 2023-05-23 MED ORDER — FUROSEMIDE 10 MG/ML IJ SOLN
30.0000 mg | Freq: Once | INTRAMUSCULAR | Status: AC
  Administered 2023-05-23: 30 mg via INTRAVENOUS
  Filled 2023-05-23: qty 4

## 2023-05-23 MED ORDER — SODIUM ZIRCONIUM CYCLOSILICATE 10 G PO PACK: 10.0000 g | PACK | Freq: Every day | ORAL | Status: DC

## 2023-05-23 NOTE — Progress Notes (Signed)
   05/23/23 1550  Vitals  BP (!) 159/82  MAP (mmHg) 105  BP Location Left Arm  BP Method Automatic  Patient Position (if appropriate) Lying  Pulse Rate 81 (81 on tele)  Pulse Rate Source Monitor  Resp (!) 32  Level of Consciousness  Level of Consciousness Alert  MEWS COLOR  MEWS Score Color Yellow  Oxygen Therapy  SpO2 97 %  O2 Device Nasal Cannula  O2 Flow Rate (L/min) 2 L/min  MEWS Score  MEWS Temp 0  MEWS Systolic 0  MEWS Pulse 0  MEWS RR 2  MEWS LOC 0  MEWS Score 2   Pt VS rechecked by this nurse due to an increase in respirations. VS are now a yellow mews. Doctor contacted and notified. VS: BP 159/82 (105), R 32, HR 81, spO2 97% on 2 L. Charge nurse, Harriett Sine aware. Pt denies SOB and has shallow respirations. Pt currently resting comfortably on 2L.

## 2023-05-23 NOTE — Progress Notes (Signed)
Patient is still off BIPAP at this time. She is currently awake and in no respiratory distress. Sat 96% on 3 lpm.

## 2023-05-23 NOTE — Progress Notes (Incomplete)
[facility] [address], [place] [address]    Clinic Day:  05/23/2023  Referring physician: [name], MD  Patient Care Team: [name], MD as PCP - General (Internal Medicine) Mallipeddi, Orion Modest, MD as PCP - Cardiology (Cardiology) Jena Gauss Gerrit Friends, MD as Consulting Physician (Gastroenterology)   ASSESSMENT & PLAN:   Assessment: 1.    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 Margo Aye for pancytopenia. Subsequently her white count has recovered along with recovery of the hemoglobin. -Bone marrow biopsy on 11/06/2020 shows hypercellular marrow with trilineage dyspoiesis and polytypic plasmacytosis.  Primary differential includes CMML and other MDS.  Definitive blast population is not identified morphologically or by flow.  There is a increased immature mononuclear cells favored to be monocytic in origin.  Megakaryocytes are frequently hypolobated and hyperchromatic without clustering.  Atypical appearing erythroid precursors.  Increased plasma cells 5 to 9% which are polytypic by kappa and lambda staining. - Chromosome analysis shows 46,X, idiocentric X chromosome[9]/46, XX[11]. -MDS FISH panel was negative. -Revised IPSS score of 3.  In the absence of treatment, median survival of 5.3 years and 25% AML progression is 10.8 years. -SPEP, methylmalonic acid, ferritin levels were normal. - Serum copper level is 87. - NGS (11/19/2020): SRSF2, TET2   2.  Stage I left breast cancer: -Diagnosed in Sep 1999, treated with lumpectomy and XRT. ER/PR was positive and received tamoxifen for 5 years. -Mammogram on 2019-10-30 was BI-RADS Category 2. Physical exam did not reveal any palpable masses today.   3.  Stage I adenocarcinoma of the left upper lobe of the lung: -Left upper lobectomy on 2005-06-01. CT chest on 2018-06-09 was stable. - CT CAP on 10/24/2020 shows stable postsurgical changes from the partial left upper lobe resection.  No other signs  of malignancy.    Plan: 1.   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 - Denies any bleeding per rectum.  However she reports occasional dark stools after recent diarrhea for the last 7 to 10 days. - Reviewed labs today: Ferritin was 533, percent saturation 19.  Hemoglobin is low at 8.3.  She was in the range of 9-10 for the past few months.  Platelet count also dropped to 44 and has been gradually decreasing.  LDH also increased to 287. - Based on these findings, I have recommended repeating bone marrow aspiration and biopsy with MDS FISH panel and cytogenetics. - Will send myeloid NGS panel and SPEP today. - She may receive Aranesp today.  I will see her back 2 weeks after the biopsy.  2.  Hyperkalemia: - She was started on Lokelma 3 months ago by Dr. Wolfgang Phoenix.  Potassium is 4.5.    No orders of the defined types were placed in this encounter.     Alben Deeds Teague,acting as a Neurosurgeon for Doreatha Massed, MD.,have documented all relevant documentation on the behalf of Doreatha Massed, MD,as directed by  Doreatha Massed, MD while in the presence of Doreatha Massed, MD.  ***   Franklin R Teague   7/21/20242:15 PM  CHIEF COMPLAINT:   Diagnosis: MDS   Cancer Staging  Invasive ductal carcinoma of left breast [facility]) Staging form: Breast, AJCC 7th Edition - Pathologic stage from 07/03/1998: Stage IA (T1a, N0, cM0) - Signed by Ellouise Newer, PA-C on 06/09/2016    Prior Therapy: None  Current Therapy:  Aransesp    HISTORY OF PRESENT ILLNESS:  Oncology History Overview Note  Called Dr Scharlene Gloss office again now, office closed. Pt will see Dr [name] and I will ask him if he can talk to Dr Margo Aye after his visit.  Thanks,   Adenocarcinoma of left lung (HCC)  06/23/2016 Imaging   CT chest- Stable exam.  No new or progressive findings. Previously described tiny perifissural nodules at the base of the right major fissure are stable and  likely represent subpleural lymph nodes.      INTERVAL HISTORY:   Lisa [name] is a 89 y.o. female presenting to clinic today for follow up of MDS. She was last seen by me on 04/01/23.  Since her last visit, she presented to the ED on 6/4, 6/12, and 7/18.On 6/4 she was diagnosed with NSTEMI and pancytopenia. On 6/12 she had a fall with an injury to the head due to generalized weakness. CT of the head and cervical spine found no abnormalities and she started home PT after being discharged on 6/13. On 7/18, she had sepsis   Today, she states that she is doing well overall. Her appetite level is at ***%. Her energy level is at ***%.  PAST MEDICAL HISTORY:   Past Medical History: Past Medical History:  Diagnosis Date   Adenocarcinoma of left lung (HCC) 2006   Arthritis    Asthma    Cancer of breast, female (HCC)    Cancer of lung (HCC)    Dysrhythmia    Hypertension    Hypothyroidism    Invasive ductal carcinoma of left breast (HCC) 1999   Myocardial infarction (HCC)    Neutropenia (HCC) 06/09/2016   Personal history of radiation therapy     Surgical History: Past Surgical History:  Procedure Laterality Date   BIOPSY  10/23/2020   Procedure: BIOPSY;  Surgeon: Benancio Deeds, MD;  Location: [facility] ENDOSCOPY;  Service: Gastroenterology;;   BREAST BIOPSY     left axillary node dissection   BREAST LUMPECTOMY Left    COLONOSCOPY  03/08/2003   YNW:GNFAOZHYQM rectal polyps destroyed with the tip of the snare/Polyps at hepatic flexure, splenic flexure at 35 cm/Left-sided diverticula: unable to retrieve path   COLONOSCOPY  09/12/2008   VHQ:IONGEX rectum and distal sigmoid diminutive polyps/scattered left sided diverticulum. hyperplastic   COLONOSCOPY N/A 03/06/2013   BMW:UXLKGMW polyp-removed as described above; colonic diverticulosis. hyperplastic polyps. next TCS 03/2018   ESOPHAGOGASTRODUODENOSCOPY (EGD) WITH PROPOFOL N/A 10/23/2020   Procedure: ESOPHAGOGASTRODUODENOSCOPY (EGD) WITH  PROPOFOL;  Surgeon: Benancio Deeds, MD;  Location: [facility] ENDOSCOPY;  Service: Gastroenterology;  Laterality: N/A;   FOOT SURGERY     LUNG REMOVAL, PARTIAL     upper lobe    Social History: Social History   Socioeconomic History   Marital status: Widowed    Spouse name: Not on file   Number of children: Not on file   Years of education: Not on file   Highest education level: Not on file  Occupational History   Not on file  Tobacco Use   Smoking status: Former    Current packs/day: 0.00    Average packs/day: 0.8 packs/day for 35.0 years (26.3 ttl pk-yrs)    Types: Cigarettes    Start date: 58    Quit date: 81    Years since quitting: 31.5   Smokeless tobacco: Never   Tobacco comments:    smoke-free X 30 yeras  Vaping Use   Vaping status: Never Used  Substance and Sexual Activity   Alcohol use: No   Drug use: No  Sexual activity: Not Currently  Other Topics Concern   Not on file  Social History Narrative   Not on file   Social Determinants of Health   Financial Resource Strain: Not on file  Food Insecurity: No Food Insecurity (05/20/2023)   Hunger Vital Sign    Worried About Running Out of Food in the Last Year: Never true    Ran Out of Food in the Last Year: Never true  Transportation Needs: No Transportation Needs (05/21/2023)   PRAPARE - Administrator, Civil Service (Medical): No    Lack of Transportation (Non-Medical): No  Physical Activity: Inactive (12/12/2020)   Exercise Vital Sign    Days of Exercise per Week: 0 days    Minutes of Exercise per Session: 0 min  Stress: Not on file  Social Connections: Not on file  Intimate Partner Violence: Not At Risk (05/20/2023)   Humiliation, Afraid, Rape, and Kick questionnaire    Fear of Current or Ex-Partner: No    Emotionally Abused: No    Physically Abused: No    Sexually Abused: No    Family History: Family History  Problem Relation Age of Onset   Cancer Mother    Cancer Sister    Colon  cancer Neg Hx     Current Medications: No current facility-administered medications for this visit. No current outpatient medications on file.  Facility-Administered Medications Ordered in Other Visits:    acetaminophen (TYLENOL) tablet 650 mg, 650 mg, Oral, Q6H PRN **OR** acetaminophen (TYLENOL) suppository 650 mg, 650 mg, Rectal, Q6H PRN, Johnson, Clanford L, MD   azithromycin (ZITHROMAX) 500 mg in sodium chloride 0.9 % 250 mL IVPB, 500 mg, Intravenous, Q24H, Johnson, Clanford L, MD, Last Rate: 250 mL/hr at 05/23/23 1102, 500 mg at 05/23/23 1102   bisacodyl (DULCOLAX) EC tablet 5 mg, 5 mg, Oral, Daily PRN, Johnson, Clanford L, MD   budesonide (PULMICORT) nebulizer solution 0.5 mg, 0.5 mg, Nebulization, BID, Mannam, Praveen, MD, 0.5 mg at 05/23/23 0843   cefTRIAXone (ROCEPHIN) 2 g in sodium chloride 0.9 % 100 mL IVPB, 2 g, Intravenous, Q24H, Johnson, Clanford L, MD, Stopped at 05/22/23 2130   Chlorhexidine Gluconate Cloth 2 % PADS 6 each, 6 each, Topical, Daily, Johnson, Clanford L, MD, 6 each at 05/23/23 0229   fludrocortisone (FLORINEF) tablet 0.1 mg, 0.1 mg, Oral, Daily, Annie Sable, MD, 0.1 mg at 05/23/23 1102   heparin injection 5,000 Units, 5,000 Units, Subcutaneous, Q8H, Johnson, Clanford L, MD, 5,000 Units at 05/23/23 0610   ipratropium-albuterol (DUONEB) 0.5-2.5 (3) MG/3ML nebulizer solution 3 mL, 3 mL, Nebulization, Q6H, Johnson, Clanford L, MD   ipratropium-albuterol (DUONEB) 0.5-2.5 (3) MG/3ML nebulizer solution 3 mL, 3 mL, Nebulization, Q2H PRN, Johnson, Clanford L, MD   isosorbide mononitrate (IMDUR) 24 hr tablet 30 mg, 30 mg, Oral, Daily, Johnson, Clanford L, MD, 30 mg at 05/23/23 1102   levothyroxine (SYNTHROID) tablet 100 mcg, 100 mcg, Oral, QAC breakfast, Johnson, Clanford L, MD, 100 mcg at 05/23/23 0609   [START ON 05/24/2023] methylPREDNISolone sodium succinate (SOLU-MEDROL) 40 mg/mL injection 40 mg, 40 mg, Intravenous, Daily, Johnson, Clanford L, MD   metoprolol  succinate (TOPROL-XL) 24 hr tablet 25 mg, 25 mg, Oral, Daily, Johnson, Clanford L, MD, 25 mg at 05/23/23 1102   mirtazapine (REMERON) tablet 15 mg, 15 mg, Oral, QHS, Johnson, Clanford L, MD, 15 mg at 05/22/23 2049   ondansetron (ZOFRAN) tablet 4 mg, 4 mg, Oral, Q6H PRN **OR** ondansetron (ZOFRAN) injection 4 mg, 4 mg, Intravenous,  Q6H PRN, Laural Benes, Clanford L, MD   Oral care mouth rinse, 15 mL, Mouth Rinse, 4 times per day, Johnson, Clanford L, MD, 15 mL at 05/23/23 1103   Oral care mouth rinse, 15 mL, Mouth Rinse, PRN, Johnson, Clanford L, MD   oxyCODONE (Oxy IR/ROXICODONE) immediate release tablet 5 mg, 5 mg, Oral, Q6H PRN, Johnson, Clanford L, MD   pantoprazole (PROTONIX) injection 40 mg, 40 mg, Intravenous, Q24H, Johnson, Clanford L, MD, 40 mg at 05/22/23 2100   pravastatin (PRAVACHOL) tablet 10 mg, 10 mg, Oral, QPM, Johnson, Clanford L, MD, 10 mg at 05/22/23 1739   [START ON 05/24/2023] sodium zirconium cyclosilicate (LOKELMA) packet 10 g, 10 g, Oral, Daily, Johnson, Clanford L, MD   Allergies: Allergies  Allergen Reactions   Meloxicam Other (See Comments)    Caused an injury to the kidneys, per nephrologist   Micardis Hct [Telmisartan-Hctz] Other (See Comments)    Caused an injury to the kidneys, per nephrologist    REVIEW OF SYSTEMS:   Review of Systems  Constitutional:  Negative for chills, fatigue and fever.  HENT:   Negative for lump/mass, mouth sores, nosebleeds, sore throat and trouble swallowing.   Eyes:  Negative for eye problems.  Respiratory:  Negative for cough and shortness of breath.   Cardiovascular:  Negative for chest pain, leg swelling and palpitations.  Gastrointestinal:  Negative for abdominal pain, constipation, diarrhea, nausea and vomiting.  Genitourinary:  Negative for bladder incontinence, difficulty urinating, dysuria, frequency, hematuria and nocturia.   Musculoskeletal:  Negative for arthralgias, back pain, flank pain, myalgias and neck pain.  Skin:   Negative for itching and rash.  Neurological:  Negative for dizziness, headaches and numbness.  Hematological:  Does not bruise/bleed easily.  Psychiatric/Behavioral:  Negative for depression, sleep disturbance and suicidal ideas. The patient is not nervous/anxious.   All other systems reviewed and are negative.    VITALS:   There were no vitals taken for this visit.  Wt Readings from Last 3 Encounters:  05/23/23 166 lb 3.6 oz (75.4 kg)  04/06/23 148 lb (67.1 kg)  04/01/23 152 lb 3.2 oz (69 kg)    There is no height or weight on file to calculate BMI.  Performance status (ECOG): 1 - Symptomatic but completely ambulatory  PHYSICAL EXAM:   Physical Exam Vitals and nursing note reviewed. Exam conducted with a chaperone present.  Constitutional:      Appearance: Normal appearance.  Cardiovascular:     Rate and Rhythm: Normal rate and regular rhythm.     Pulses: Normal pulses.     Heart sounds: Normal heart sounds.  Pulmonary:     Effort: Pulmonary effort is normal.     Breath sounds: Normal breath sounds.  Abdominal:     Palpations: Abdomen is soft. There is no hepatomegaly, splenomegaly or mass.     Tenderness: There is no abdominal tenderness.  Musculoskeletal:     Right lower leg: No edema.     Left lower leg: No edema.  Lymphadenopathy:     Cervical: No cervical adenopathy.     Right cervical: No superficial, deep or posterior cervical adenopathy.    Left cervical: No superficial, deep or posterior cervical adenopathy.     Upper Body:     Right upper body: No supraclavicular or axillary adenopathy.     Left upper body: No supraclavicular or axillary adenopathy.  Neurological:     General: No focal deficit present.     Mental Status: She is alert and  oriented to person, place, and time.  Psychiatric:        Mood and Affect: Mood normal.        Behavior: Behavior normal.     LABS:      Latest Ref Rng & Units 05/23/2023    4:10 AM 05/22/2023    4:35 AM 05/21/2023     4:04 AM  CBC  WBC 4.0 - 10.5 K/uL 2.1  3.0  4.4   Hemoglobin 12.0 - 15.0 g/dL 7.7  7.3  7.3   Hematocrit 36.0 - 46.0 % 27.1  26.5  26.7   Platelets 150 - 400 K/uL 68  75  79       Latest Ref Rng & Units 05/23/2023    4:10 AM 05/22/2023    4:59 PM 05/22/2023    4:35 AM  CMP  Glucose 70 - 99 mg/dL 578  469  629   BUN 8 - 23 mg/dL 22  23  22    Creatinine 0.44 - 1.00 mg/dL 5.28  4.13  2.44   Sodium 135 - 145 mmol/L 133  132  131   Potassium 3.5 - 5.1 mmol/L 3.6  4.1  5.0   Chloride 98 - 111 mmol/L 97  97  96   CO2 22 - 32 mmol/L 27  28  29    Calcium 8.9 - 10.3 mg/dL 8.5  8.2  8.4      No results found for: "CEA1", "CEA" / No results found for: "CEA1", "CEA" No results found for: "PSA1" No results found for: "CAN199" No results found for: "CAN125"  Lab Results  Component Value Date   TOTALPROTELP 9.0 (H) 04/01/2023   ALBUMINELP 3.5 04/01/2023   A1GS 0.3 04/01/2023   A2GS 0.6 04/01/2023   BETS 1.2 04/01/2023   GAMS 3.5 (H) 04/01/2023   MSPIKE Not Observed 04/01/2023   SPEI Comment 04/01/2023   Lab Results  Component Value Date   TIBC 318 04/01/2023   TIBC 316 02/12/2023   TIBC 311 12/17/2022   FERRITIN 533 (H) 04/01/2023   FERRITIN 360 (H) 02/12/2023   FERRITIN 341 (H) 12/17/2022   IRONPCTSAT 19 04/01/2023   IRONPCTSAT 18 02/12/2023   IRONPCTSAT 20 12/17/2022   Lab Results  Component Value Date   LDH 287 (H) 04/01/2023   LDH 264 (H) 02/12/2023   LDH 314 (H) 11/03/2022     STUDIES:   DG CHEST PORT 1 VIEW  Result Date: 05/23/2023 CLINICAL DATA:  Evaluate for pulmonary infiltrates. EXAM: PORTABLE CHEST 1 VIEW COMPARISON:  05/20/2023 FINDINGS: Heart size is partially obscured by left lower lung opacity. Aortic atherosclerotic calcifications. Bilateral pleural effusions are noted. Scratch set persistent, small bilateral pleural effusions. Chronic coarsened interstitial markings and diffuse bronchial wall thickening again noted. Asymmetric opacification within the  left base is unchanged. Atelectasis in the right base is improved from the previous exam. IMPRESSION: 1. Persistent, small bilateral pleural effusions. 2. Persistent asymmetric opacification within the left base which may reflect atelectasis or pneumonia. 3. Improved appearance of atelectasis in the right base. 4. Chronic bronchial wall thickening and coarsened interstitial markings. Electronically Signed   By: Signa Kell M.D.   On: 05/23/2023 08:11   DG Chest Port 1 View  Result Date: 05/21/2023 CLINICAL DATA:  COPD exacerbation. EXAM: PORTABLE CHEST 1 VIEW COMPARISON:  05/20/2023 FINDINGS: Heart margins are partially obscured by opacity at the LEFT lung base, similar in appearance to prior study. Patchy infiltrates are seen throughout the lungs bilaterally and appear similar. Bilateral  pleural effusions are present, LEFT greater than RIGHT. IMPRESSION: Bilateral pulmonary infiltrates and pleural effusions. Electronically Signed   By: Norva Pavlov M.D.   On: 05/21/2023 14:27   ECHOCARDIOGRAM LIMITED  Result Date: 05/20/2023    ECHOCARDIOGRAM LIMITED REPORT   Patient Name:   Lisa [name] Date of Exam: 05/20/2023 Medical Rec #:  [id number]      Height:       63.0 in Accession #:    [id number]     Weight:       166.0 lb Date of Birth:  08/25/1934      BSA:          1.786 m Patient Age:    89 years       BP:           111/57 mmHg Patient Gender: F              HR:           66 bpm. [facility] Procedure: Limited Echo, Cardiac Doppler and Limited Color Doppler Indications:    Bradycardia [[id number]]  History:        Patient has prior history of Echocardiogram examinations, most                 recent 04/07/2023. Previous Myocardial Infarction, CAP; Risk                 Factors:Former Smoker and Hypertension. Breast, Lung Cancer.  Sonographer:    Aron Baba Referring Phys: [id number] Cleora Fleet  Sonographer Comments: Image acquisition challenging due to patient behavioral factors. and Image  acquisition challenging due to uncooperative patient. IMPRESSIONS  1. Limited Echo for LVEF assessment.  2. Left ventricular ejection fraction, by estimation, is 60 to 65%. The left ventricle has normal function. The left ventricle has no regional wall motion abnormalities. There is moderate left ventricular hypertrophy. Left ventricular diastolic function  could not be evaluated.  3. Right ventricular systolic function is normal. The right ventricular size is not well visualized. There is severely elevated pulmonary artery systolic pressure.  4. The mitral valve is normal in structure. Mild mitral valve regurgitation.  5. The tricuspid valve is abnormal. Tricuspid valve regurgitation is moderate to severe.  6. The aortic valve was not assessed. Aortic valve regurgitation is not visualized. Moderate aortic valve stenosis. Aortic valve Vmax measures 3.00 m/s.  7. The inferior vena cava is dilated in size with <50% respiratory variability, suggesting right atrial pressure of 15 mmHg. Comparison(s): Changes from prior study are noted. Functional moderate to severe TR is new. FINDINGS  Left Ventricle: Left ventricular ejection fraction, by estimation, is 60 to 65%. The left ventricle has normal function. The left ventricle has no regional wall motion abnormalities. The left ventricular internal cavity size was normal in size. There is  moderate left ventricular hypertrophy. Left ventricular diastolic function could not be evaluated. Left ventricular diastolic function could not be evaluated due to nondiagnostic images. Right Ventricle: The right ventricular size is not well visualized. Right vetricular wall thickness was not assessed. Right ventricular systolic function is normal. There is severely elevated pulmonary artery systolic pressure. The tricuspid regurgitant velocity is 3.50 m/s, and with an assumed right atrial pressure of 15 mmHg, the estimated right ventricular systolic pressure is 64.0 mmHg. Left Atrium:  Left atrial size was not assessed. Right Atrium: Right atrial size was severely dilated. Pericardium: There is no evidence of pericardial effusion. Mitral Valve: The mitral valve is  normal in structure. Mild mitral valve regurgitation. Tricuspid Valve: The tricuspid valve is abnormal. Tricuspid valve regurgitation is moderate to severe. Aortic Valve: The aortic valve was not assessed. Aortic valve regurgitation is not visualized. Moderate aortic stenosis is present. Aortic valve peak gradient measures 36.0 mmHg. Pulmonic Valve: The pulmonic valve was not assessed. Aorta: The aortic root is normal in size and structure. Venous: The inferior vena cava is dilated in size with less than 50% respiratory variability, suggesting right atrial pressure of 15 mmHg. IAS/Shunts: The interatrial septum was not well visualized. Additional Comments: Spectral Doppler performed. Color Doppler performed.  LEFT VENTRICLE PLAX 2D LVIDd:         3.70 cm LVIDs:         2.40 cm LV PW:         1.40 cm LV IVS:        1.30 cm  LEFT ATRIUM         Index LA diam:    4.00 cm 2.24 cm/m  AORTIC VALVE AV Vmax:      300.00 cm/s AV Peak Grad: 36.0 mmHg MITRAL VALVE                TRICUSPID VALVE MV Area (PHT): 3.77 cm     TR Peak grad:   49.0 mmHg MV Decel Time: 201 msec     TR Vmax:        350.00 cm/s MR Peak grad: 113.2 mmHg MR Vmax:      532.00 cm/s MV E velocity: 113.00 cm/s MV A velocity: 84.80 cm/s MV E/A ratio:  1.33 Vishnu Priya Mallipeddi Electronically signed by Winfield Rast Mallipeddi Signature Date/Time: 05/20/2023/1:55:43 PM    Final    DG Chest Port 1 View  Result Date: 05/20/2023 CLINICAL DATA:  Sepsis EXAM: PORTABLE CHEST 1 VIEW COMPARISON:  04/06/2023 FINDINGS: Symmetric pulmonary insufflation. Moderate perihilar and lower lung zone interstitial pulmonary infiltrate is present most in keeping with moderate cardiogenic failure. Small bilateral, left greater than right, pleural effusions are present. No pneumothorax. Cardiac size  is enlarged, unchanged. No acute bone abnormality. IMPRESSION: 1. Moderate cardiogenic failure. Electronically Signed   By: Helyn Numbers M.D.   On: 05/20/2023 03:35   CT Head Wo Contrast  Result Date: 05/20/2023 CLINICAL DATA:  Mental status change, unknown cause EXAM: CT HEAD WITHOUT CONTRAST TECHNIQUE: Contiguous axial images were obtained from the base of the skull through the vertex without intravenous contrast. RADIATION DOSE REDUCTION: This exam was performed according to the departmental dose-optimization program which includes automated exposure control, adjustment of the mA and/or kV according to patient size and/or use of iterative reconstruction technique. COMPARISON:  04/14/2023 FINDINGS: Brain: Normal anatomic configuration. Parenchymal volume loss is commensurate with the patient's age. Stable moderate periventricular white matter changes are present likely reflecting the sequela of small vessel ischemia. No abnormal intra or extra-axial mass lesion or fluid collection. No abnormal mass effect or midline shift. No evidence of acute intracranial hemorrhage or infarct. Ventricular size is normal. Cerebellum unremarkable. Vascular: No asymmetric hyperdense vasculature at the skull base. Skull: Intact Sinuses/Orbits: Paranasal sinuses are clear. Orbits are unremarkable. Other: Mastoid air cells and middle ear cavities are clear. IMPRESSION: 1. No acute intracranial hemorrhage or infarct. 2. Stable senescent change. Electronically Signed   By: Helyn Numbers M.D.   On: 05/20/2023 03:26   CT BONE MARROW BIOPSY & ASPIRATION  Result Date: 04/29/2023 INDICATION: 89 year old female referred for bone marrow biopsy EXAM: CT BONE MARROW BIOPSY AND ASPIRATION  MEDICATIONS: None. ANESTHESIA/SEDATION: Moderate (conscious) sedation was employed during this procedure. A total of Versed 1.0 mg and Fentanyl 50 mcg was administered intravenously. Moderate Sedation Time: 10 minutes. The patient's level of  consciousness and vital signs were monitored continuously by radiology nursing throughout the procedure under my direct supervision. FLUOROSCOPY TIME:  CT COMPLICATIONS: None PROCEDURE: Informed written consent was obtained from the patient after a thorough discussion of the procedural risks, benefits and alternatives. All questions were addressed. Maximal Sterile Barrier Technique was utilized including caps, mask, sterile gowns, sterile gloves, sterile drape, hand hygiene and skin antiseptic. A timeout was performed prior to the initiation of the procedure. Scout CT of the pelvis was performed for surgical planning purposes. The posterior pelvis was prepped with Chlorhexidine in a sterile fashion, and a sterile drape was applied covering the operative field. A sterile gown and sterile gloves were used for the procedure. Local anesthesia was provided with 1% Lidocaine. Posterior right iliac bone was targeted for biopsy. The skin and subcutaneous tissues were infiltrated with 1% lidocaine without epinephrine. A small stab incision was made with an 11 blade scalpel, and an 11 gauge Murphy needle was advanced with CT guidance to the posterior cortex. Manual forced was used to advance the needle through the posterior cortex and the stylet was removed. A bone marrow aspirate was retrieved and passed to a cytotechnologist in the room. The Murphy needle was then advanced without the stylet for a core biopsy. The core biopsy was retrieved and also passed to a cytotechnologist. Manual pressure was used for hemostasis and a sterile dressing was placed. No complications were encountered no significant blood loss was encountered. Patient tolerated the procedure well and remained hemodynamically stable throughout. IMPRESSION: Status post image guided bone marrow biopsy. Signed, Yvone Neu. Miachel Roux, RPVI [facility] Radiology Electronically Signed   By: Gilmer Mor D.O.    On: 04/29/2023 12:58

## 2023-05-23 NOTE — Progress Notes (Signed)
Patient currently off BIPAP. Per RN patient kept taking mask off even with mittens on and patient requested to come off stating she did not want to wear it any longer.

## 2023-05-23 NOTE — Progress Notes (Addendum)
PROGRESS NOTE   Lisa [name]  ZOX:[id number] DOB: 09/23/1934 DOA: 05/20/2023 PCP: Lisa Stabile, MD   Chief Complaint  Patient presents with   Altered Mental Status   Level of care: Stepdown  Brief Admission History:  89 y.o. female with medical history significant for COPD c/b chronic hypoxic respiratory failure on 2L baseline, HFpEF, CKD3b, Paroxysmal Afib, and MDS/CMML c/b chronic neutropenia and thrombocytopenia, and chronic hyperkalemia, who presented with weakness and was admitted for acute on chronic hypoxic and hypercapnic respiratory failure and COPD exacerbation.   Lisa [name] was recently admitted 6/4-6/7 for an NSTEMI, though her course was also complicated by acute blood loss anemia (discharged with PPI BID per GI) and hyperkalemia (discharged with every other day [facility]).   On 7/18, Lisa [name] presented after EMS was called by her family because she had become progressively weaker over the prior 2 days. Also endorsed decreased appetite, increased confusion, sleeping throughout the day, and ultimately found to have increased O2 requirement after family checked O2 sats in 70s after Lisa [name] collapsed to the floor.   On presentation to ED, Lisa [name] was advanced to 6L to maintain O2 sats >90%, duonebs, and NaHCO3 after ABG showed 7.23/65/46/26. Subsequent ABG worsened to 7.22/72/<31/30 and she was moved to BiPAP as she was brought into ICU. Her K was also elevated at 6.5, without EKG changes, given Calcium gluconate, Lokelma 5g and 2x 1L LR boluses. 7/18 CXR c/w moderate perihilar and lower lobe interstitial pulmonary edema. BNP elevated at 1095 with a normal lactate. Also found to have stable on chronic neutropenia with ANC 200, and stable on chronic thrombocytopenia with PLT at 101k. Temp has fluctuated 97.2 to 101.4. Her 7/18 CT Head revealed no acute intracranial process.     Over the course of afternoon and night of 7/18, consulted PCCM for BiPAP settings. Continued to manage  K with Lasix and Lokelma. This morning, 7/19, K is at 6.0 and Na has improved to 128. Per PCCM recs, will trial pt off of Bipap today, repeat CXR, and continue IV Lasix 40mg . Nephrology adding fludrocortisone and increasing Lokelma to BID for hyperkalemia.    Assessment and Plan:  Acute on chronic hypoxic and hypercapnic respiratory failure c/b COPD Exacerbation, CAP and acute diastolic heart failure Pt presented with ABG 7.23/65/46/27, with increased supplemental O2 requirement from 2L baseline to 4L in ED. 7/18 CXR with moderate perihilar lower lobe interstitial infiltrates c/f pulmonary edema. Given her recent NSTEMI last month, the CXR, and BNP of 1095, was suspicious of acute HFpEF exacerbation, but not matching clinical exam and pt handled initial 1L LR boluses in ED well. 7/19 Echo with preserved EF however did demonstrate severe pulmonary artery hypertension.  Appreciate PCCM recs, trial off bipap today, changed to PRN; Continuing with IV 30mg  Lasix x 1 dose. Adding incentive spirometry and flutter valve.  - Ceftriaxone and Azithromycin initiated 7/19  - Repeat CXR with bilateral pulmonary infiltrates and pleural effusions - plan to give IV furosemide 30 mg today  - Pulmicort 0.5 BID - IV Methylprednisolone 40mg  daily (IV Protonix 40mg  daily) - Duoneb QID  - Continue supplemental O2 - pt has diuresed >3.1L since admission and clinically improving    Hyperkalemia - resolved with aggressive treatments Presented with K at 6.5, acute on chronic, and takes Lokelma every other day at home since her last discharge in June. Has continued to be elevated despite Lasix, Lokelma, Insulin with D50, but better on last recheck at 6.0. Repeat EKGs without  T wave changes. Appreciate Nephrology recs. Increasing IV Lasix 40mg  to BID today, and increasing Lokelma to BID as well. 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 - IV furosemide - follow BMP  - Hold Lokelma today (K 3.6)   Symptomatic  Hyponatremia Presented with Na at 122, and also appears to be an acute on chronic problem 2/2 poor PO intake, which has been worse over the last two days, per son. Pt presents with son who endorses concern of increased confusion over last two days as well. Less confused today, 7/19. Goal Na by 7/19 AM was 128-130, which was met this AM with Na at 128. Urine sodium 75 with 275 urine osm, not appearing as SIADH, nor adrenal insufficiency given morning cortisol wnl. Also improved alongside lasix, perhaps volume component was present, will continue diuresis as above. Could be Type IV RTA. - slowly improving, Will continue to monitor - Consult to nephrology appreciated   Acute HFpEF exacerbation, and HTN 7/18 CXR with moderate perihilar lower lobe interstitial infiltrates c/f pulmonary edema. Given her recent NSTEMI last month, the CXR, and BNP of 1095, originally suspected acute HFpEF exacerbation. While pt is not hypervolemic on exam, cannot completely exclude acute exacerbation. However, she received 2x 1L LR boluses in ED overnight and appeared to handle that well. 7/18 Echo with preserved EF, but severe pulmonary hypertension. Continuing IV Lasix 30mg  today.  - Continue to monitor closely - Imdur 30 mg daily - Metoprolol succinate 25mg     Intake/Output Summary (Last 24 hours) at 05/23/2023 1059 Last data filed at 05/23/2023 0200 Gross per 24 hour  Intake 560 ml  Output 2150 ml  Net -1590 ml   Filed Weights   05/20/23 0237 05/22/23 0500 05/23/23 0342  Weight: 75.3 kg 75 kg 75.4 kg    Neutropenia iso MDS/CMML-2 Presents with ANC of 200, chronic since September 2022. Her temperature fluctuated widely in the ED reports, from 97.2 up to one time febrile range at 101.4. No current infectious symptoms. UA clear, and comparing CXR with symptoms not concerning for PNA. Reviewed 6/27 Bone Marrow biopsy report which was consistent with Chronic Myelomonocytic Leukemia-2 with hypercellular bone marrow with  increased blasts at 10-15%. Appears she may not have seen her heme/onc Lisa [name] since those results.  - Will continue to monitor   Thrombocytopenia - chronic Presents with PLT at 101k, and has fluctuated between 52k and 105k since at least Fall 2023.  - Will continue to monitor, stop heparin, use TED hoses   Recent GI Bleed Presenting Hgb at 7.8, down from 9.2 on 6/27, and 10.0 on 6/12. Last admission 6/4-6/7 was complicated by guaiac positive stool necessitating transfusion, and GI consult. Will check FOBT and consult GI if necessary. - Continue Protonix 40mg  BID for GI protection  Acute ICU Delirium  - slowly improving as respiratory failure improves - weaning down IV steroids to once daily - delirium precautions - family at bedside to help re-orient    CAD Recent NSTEMI early June 2024 with admission. No symptoms presently. - Continue Pravastatin 10mg  qhs   Paroxysmal Afib Not a candidate for anticoagulation. Presenting EKG with NSR. - Continue Metoprolol succinate 25mg  daily   Hypothyroidism - Continue Levothyroxine 100 mcg daily    DVT prophylaxis: TED hose Code Status: Full    Disposition: Status is: Inpatient   Consultants:  PCCM nephrology  Procedures:   Antimicrobials:  Ceftiaxone  Azithromycin    Subjective: Pt a little less confused today,  she denies CP and SOB.  She is highly resistant to using bipap, barely used it last night.   Objective: Vitals:   05/23/23 0400 05/23/23 0600 05/23/23 0751 05/23/23 0843  BP: (!) 152/98 124/67    Pulse: (!) 135 (!) 125    Resp: (!) 28 (!) 23    Temp:   98.3 F (36.8 C)   TempSrc:   Oral   SpO2: 96% 96%  98%  Weight:      Height:        Intake/Output Summary (Last 24 hours) at 05/23/2023 1051 Last data filed at 05/23/2023 0200 Gross per 24 hour  Intake 560 ml  Output 2150 ml  Net -1590 ml   Filed Weights   05/20/23 0237 05/22/23 0500 05/23/23 0342  Weight: 75.3 kg 75 kg 75.4 kg   Examination:  General  exam: awake, alert, NAD.    Respiratory system: better air movement bilateral; rare crackles heard at base  Cardiovascular system: normal S1 & S2 heard. Irregularly irregular and tachycardic rate, trace pedal edema. Gastrointestinal system: Abdomen is nondistended, soft and nontender. No organomegaly or masses felt. Normal bowel sounds heard. Central nervous system: Alert but confused. No focal neurological deficits. Extremities: Symmetric 5 x 5 power. Skin: No rashes, lesions or ulcers. Psychiatry: Judgement and insight appear poor with confusion. Mood & affect appropriate.   Data Reviewed: I have personally reviewed following labs and imaging studies  CBC: Recent Labs  Lab 05/20/23 0245 05/21/23 0404 05/22/23 0435 05/23/23 0410  WBC 5.5 4.4 3.0* 2.1*  NEUTROABS 0.2*  --   --   --   HGB 7.8* 7.3* 7.3* 7.7*  HCT 27.6* 26.7* 26.5* 27.1*  MCV 103.0* 104.7* 103.9* 101.9*  PLT 101* 79* 75* 68*    Basic Metabolic Panel: Recent Labs  Lab 05/21/23 0404 05/21/23 0814 05/21/23 1641 05/22/23 0435 05/22/23 1659 05/23/23 0410  NA 126* 128* 130* 131* 132* 133*  K 5.9* 6.0* 6.0* 5.0 4.1 3.6  CL 96*  --  96* 96* 97* 97*  CO2 24  --  28 29 28 27   GLUCOSE 109*  --  112* 123* 137* 131*  BUN 17  --  19 22 23 22   CREATININE 1.22*  --  1.17* 1.15* 1.14* 1.03*  CALCIUM 7.9*  --  8.5* 8.4* 8.2* 8.5*  MG 2.1  --   --   --   --   --     CBG: Recent Labs  Lab 05/20/23 2321 05/21/23 0052 05/21/23 0328  GLUCAP 97 97 110*    Recent Results (from the past 240 hour(s))  Resp panel by RT-PCR (RSV, Flu A&B, Covid) Anterior Nasal Swab     Status: None   Collection Time: 05/20/23  2:45 AM   Specimen: Anterior Nasal Swab  Result Value Ref Range Status   SARS Coronavirus 2 by RT PCR NEGATIVE NEGATIVE Final    Comment: (NOTE) SARS-CoV-2 target nucleic acids are NOT DETECTED.  The SARS-CoV-2 RNA is generally detectable in upper respiratory specimens during the acute phase of infection. The  lowest concentration of SARS-CoV-2 viral copies this assay can detect is 138 copies/mL. A negative result does not preclude SARS-Cov-2 infection and should not be used as the sole basis for treatment or other patient management decisions. A negative result may occur with  improper specimen collection/handling, submission of specimen other than nasopharyngeal swab, presence of viral mutation(s) within the areas targeted by this assay, and inadequate number of viral copies(<138 copies/mL). A negative  result must be combined with clinical observations, patient history, and epidemiological information. The expected result is Negative.  Fact Sheet for Patients:  BloggerCourse.com  Fact Sheet for Healthcare Providers:  SeriousBroker.it  This test is no t yet approved or cleared by the [place] FDA and  has been authorized for detection and/or diagnosis of SARS-CoV-2 by FDA under an Emergency Use Authorization (EUA). This EUA will remain  in effect (meaning this test can be used) for the duration of the COVID-19 declaration under Section 564(b)(1) of the Act, 21 U.S.C.section 360bbb-3(b)(1), unless the authorization is terminated  or revoked sooner.       Influenza A by PCR NEGATIVE NEGATIVE Final   Influenza B by PCR NEGATIVE NEGATIVE Final    Comment: (NOTE) The Xpert Xpress SARS-CoV-2/FLU/RSV plus assay is intended as an aid in the diagnosis of influenza from Nasopharyngeal swab specimens and should not be used as a sole basis for treatment. Nasal washings and aspirates are unacceptable for Xpert Xpress SARS-CoV-2/FLU/RSV testing.  Fact Sheet for Patients: BloggerCourse.com  Fact Sheet for Healthcare Providers: SeriousBroker.it  This test is not yet approved or cleared by the [place] FDA and has been authorized for detection and/or diagnosis of SARS-CoV-2 by FDA under  an Emergency Use Authorization (EUA). This EUA will remain in effect (meaning this test can be used) for the duration of the COVID-19 declaration under Section 564(b)(1) of the Act, 21 U.S.C. section 360bbb-3(b)(1), unless the authorization is terminated or revoked.     Resp Syncytial Virus by PCR NEGATIVE NEGATIVE Final    Comment: (NOTE) Fact Sheet for Patients: BloggerCourse.com  Fact Sheet for Healthcare Providers: SeriousBroker.it  This test is not yet approved or cleared by the [place] FDA and has been authorized for detection and/or diagnosis of SARS-CoV-2 by FDA under an Emergency Use Authorization (EUA). This EUA will remain in effect (meaning this test can be used) for the duration of the COVID-19 declaration under Section 564(b)(1) of the Act, 21 U.S.C. section 360bbb-3(b)(1), unless the authorization is terminated or revoked.  Performed at [facility], [address], [place], [place] [address]   Blood Culture (routine x 2)     Status: None (Preliminary result)   Collection Time: 05/20/23  2:45 AM   Specimen: Left Antecubital; Blood  Result Value Ref Range Status   Specimen Description LEFT ANTECUBITAL  Final   Special Requests   Final    BOTTLES DRAWN AEROBIC AND ANAEROBIC Blood Culture adequate volume   Culture   Final    NO GROWTH 3 DAYS Performed at [facility], [address], [place], [place] [address]    Report Status PENDING  Incomplete  Blood Culture (routine x 2)     Status: None (Preliminary result)   Collection Time: 05/20/23  2:50 AM   Specimen: BLOOD RIGHT HAND  Result Value Ref Range Status   Specimen Description BLOOD RIGHT HAND  Final   Special Requests   Final    BOTTLES DRAWN AEROBIC AND ANAEROBIC Blood Culture adequate volume   Culture   Final    NO GROWTH 3 DAYS Performed at [facility], [address] [place], [place] [address]    Report Status PENDING  Incomplete  MRSA Next Gen  by PCR, Nasal     Status: None   Collection Time: 05/20/23 12:45 PM   Specimen: Nasal Mucosa; Nasal Swab  Result Value Ref Range Status   MRSA by PCR Next Gen NOT DETECTED NOT DETECTED Final  Comment: (NOTE) The GeneXpert MRSA Assay (FDA approved for NASAL specimens only), is one component of a comprehensive MRSA colonization surveillance program. It is not intended to diagnose MRSA infection nor to guide or monitor treatment for MRSA infections. Test performance is not FDA approved in patients less than 7 years old. Performed at [facility], [address] [place], [place] [address]      Radiology Studies: DG CHEST PORT 1 VIEW  Result Date: 05/23/2023 CLINICAL DATA:  Evaluate for pulmonary infiltrates. EXAM: PORTABLE CHEST 1 VIEW COMPARISON:  05/20/2023 FINDINGS: Heart size is partially obscured by left lower lung opacity. Aortic atherosclerotic calcifications. Bilateral pleural effusions are noted. Scratch set persistent, small bilateral pleural effusions. Chronic coarsened interstitial markings and diffuse bronchial wall thickening again noted. Asymmetric opacification within the left base is unchanged. Atelectasis in the right base is improved from the previous exam. IMPRESSION: 1. Persistent, small bilateral pleural effusions. 2. Persistent asymmetric opacification within the left base which may reflect atelectasis or pneumonia. 3. Improved appearance of atelectasis in the right base. 4. Chronic bronchial wall thickening and coarsened interstitial markings. Electronically Signed   By: Signa Kell M.D.   On: 05/23/2023 08:11   DG Chest Port 1 View  Result Date: 05/21/2023 CLINICAL DATA:  COPD exacerbation. EXAM: PORTABLE CHEST 1 VIEW COMPARISON:  05/20/2023 FINDINGS: Heart margins are partially obscured by opacity at the LEFT lung base, similar in appearance to prior study. Patchy infiltrates are seen throughout the lungs bilaterally and appear similar. Bilateral pleural effusions  are present, LEFT greater than RIGHT. IMPRESSION: Bilateral pulmonary infiltrates and pleural effusions. Electronically Signed   By: Norva Pavlov M.D.   On: 05/21/2023 14:27    Scheduled Meds:  budesonide (PULMICORT) nebulizer solution  0.5 mg Nebulization BID   Chlorhexidine Gluconate Cloth  6 each Topical Daily   fludrocortisone  0.1 mg Oral Daily   furosemide  30 mg Intravenous Once   heparin  5,000 Units Subcutaneous Q8H   ipratropium-albuterol  3 mL Nebulization Q6H   isosorbide mononitrate  30 mg Oral Daily   levothyroxine  100 mcg Oral QAC breakfast   [START ON 05/24/2023] methylPREDNISolone (SOLU-MEDROL) injection  40 mg Intravenous Daily   metoprolol succinate  25 mg Oral Daily   mirtazapine  15 mg Oral QHS   mouth rinse  15 mL Mouth Rinse 4 times per day   pantoprazole (PROTONIX) IV  40 mg Intravenous Q24H   pravastatin  10 mg Oral QPM   [START ON 05/24/2023] sodium zirconium cyclosilicate  10 g Oral Daily   Continuous Infusions:  azithromycin Stopped (05/22/23 1901)   cefTRIAXone (ROCEPHIN)  IV Stopped (05/22/23 2130)    LOS: 3 days   Time spent: 38 mins  Jonathon Castelo Laural Benes, MD How to contact the [facility] Attending or Consulting provider 7A - 7P or covering provider during after hours 7P -7A, for this patient?  Check the care team in [facility] and look for a) attending/consulting TR[facility] provider listed and b) the [facility] team listed Log into [email or web address] and use [place]'s universal password to access. If you do not have the password, please contact the hospital operator. Locate the [facility] provider you are looking for under [facility] and page to a number that you can be directly reached. If you still have difficulty reaching the provider, please page the [facility] (Director on Call) for the Hospitalists listed on amion for assistance.  05/23/2023, 10:51 AM

## 2023-05-24 ENCOUNTER — Inpatient Hospital Stay: Payer: Medicare Other | Admitting: Hematology

## 2023-05-24 ENCOUNTER — Encounter (HOSPITAL_COMMUNITY): Payer: Self-pay | Admitting: Family Medicine

## 2023-05-24 DIAGNOSIS — J189 Pneumonia, unspecified organism: Secondary | ICD-10-CM | POA: Diagnosis not present

## 2023-05-24 DIAGNOSIS — E872 Acidosis, unspecified: Secondary | ICD-10-CM

## 2023-05-24 DIAGNOSIS — Z515 Encounter for palliative care: Secondary | ICD-10-CM | POA: Diagnosis not present

## 2023-05-24 DIAGNOSIS — Z7189 Other specified counseling: Secondary | ICD-10-CM | POA: Diagnosis not present

## 2023-05-24 DIAGNOSIS — D61818 Other pancytopenia: Secondary | ICD-10-CM | POA: Diagnosis not present

## 2023-05-24 DIAGNOSIS — R627 Adult failure to thrive: Secondary | ICD-10-CM | POA: Diagnosis not present

## 2023-05-24 DIAGNOSIS — E875 Hyperkalemia: Secondary | ICD-10-CM

## 2023-05-24 LAB — COMPREHENSIVE METABOLIC PANEL
ALT: 24 U/L (ref 0–44)
AST: 36 U/L (ref 15–41)
Albumin: 3.1 g/dL — ABNORMAL LOW (ref 3.5–5.0)
Alkaline Phosphatase: 44 U/L (ref 38–126)
Anion gap: 11 (ref 5–15)
BUN: 19 mg/dL (ref 8–23)
CO2: 26 mmol/L (ref 22–32)
Calcium: 8.7 mg/dL — ABNORMAL LOW (ref 8.9–10.3)
Chloride: 97 mmol/L — ABNORMAL LOW (ref 98–111)
Creatinine, Ser: 1.08 mg/dL — ABNORMAL HIGH (ref 0.44–1.00)
GFR, Estimated: 50 mL/min — ABNORMAL LOW (ref >60)
Glucose, Bld: 136 mg/dL — ABNORMAL HIGH (ref 70–99)
Potassium: 3.6 mmol/L (ref 3.5–5.1)
Sodium: 134 mmol/L — ABNORMAL LOW (ref 135–145)
Total Bilirubin: 0.7 mg/dL (ref 0.3–1.2)
Total Protein: 8 g/dL (ref 6.5–8.1)

## 2023-05-24 LAB — LEGIONELLA PNEUMOPHILA SEROGP 1 UR AG
L. pneumophila Serogp 1 Ur Ag: NEGATIVE
L. pneumophila Serogp 1 Ur Ag: NEGATIVE

## 2023-05-24 LAB — CBC
HCT: 27.7 % — ABNORMAL LOW (ref 36.0–46.0)
Hemoglobin: 8.1 g/dL — ABNORMAL LOW (ref 12.0–15.0)
MCH: 29.7 pg (ref 26.0–34.0)
MCHC: 29.2 g/dL — ABNORMAL LOW (ref 30.0–36.0)
MCV: 101.5 fL — ABNORMAL HIGH (ref 80.0–100.0)
Platelets: 72 10*3/uL — ABNORMAL LOW (ref 150–400)
RBC: 2.73 MIL/uL — ABNORMAL LOW (ref 3.87–5.11)
RDW: 23.6 % — ABNORMAL HIGH (ref 11.5–15.5)
WBC: 5.1 10*3/uL (ref 4.0–10.5)
nRBC: 23.6 % — ABNORMAL HIGH (ref 0.0–0.2)

## 2023-05-24 LAB — BLOOD GAS, VENOUS
Acid-Base Excess: 10.6 mmol/L — ABNORMAL HIGH (ref 0.0–2.0)
Bicarbonate: 35.6 mmol/L — ABNORMAL HIGH (ref 20.0–28.0)
Drawn by: 1528
O2 Saturation: 90.6 %
Patient temperature: 36.7
pCO2, Ven: 49 mmHg (ref 44–60)
pH, Ven: 7.46 — ABNORMAL HIGH (ref 7.25–7.43)
pO2, Ven: 54 mmHg — ABNORMAL HIGH (ref 32–45)

## 2023-05-24 LAB — BASIC METABOLIC PANEL
Anion gap: 9 (ref 5–15)
BUN: 22 mg/dL (ref 8–23)
CO2: 30 mmol/L (ref 22–32)
Calcium: 8.8 mg/dL — ABNORMAL LOW (ref 8.9–10.3)
Chloride: 97 mmol/L — ABNORMAL LOW (ref 98–111)
Creatinine, Ser: 0.98 mg/dL (ref 0.44–1.00)
GFR, Estimated: 56 mL/min — ABNORMAL LOW (ref >60)
Glucose, Bld: 96 mg/dL (ref 70–99)
Potassium: 3.1 mmol/L — ABNORMAL LOW (ref 3.5–5.1)
Sodium: 136 mmol/L (ref 135–145)

## 2023-05-24 LAB — CULTURE, BLOOD (ROUTINE X 2)
Culture: NO GROWTH
Special Requests: ADEQUATE
Special Requests: ADEQUATE

## 2023-05-24 LAB — MAGNESIUM
Magnesium: 1.6 mg/dL — ABNORMAL LOW (ref 1.7–2.4)
Magnesium: 2.2 mg/dL (ref 1.7–2.4)

## 2023-05-24 LAB — TROPONIN I (HIGH SENSITIVITY)
Troponin I (High Sensitivity): 318 ng/L (ref <18)
Troponin I (High Sensitivity): 297 ng/L (ref <18)

## 2023-05-24 MED ORDER — ASPIRIN 300 MG RE SUPP: 300.0000 mg | Freq: Once | RECTAL | Status: DC

## 2023-05-24 MED ORDER — POTASSIUM CHLORIDE CRYS ER 20 MEQ PO TBCR
40.0000 meq | EXTENDED_RELEASE_TABLET | Freq: Once | ORAL | Status: AC
  Administered 2023-05-24: 40 meq via ORAL
  Filled 2023-05-24: qty 2

## 2023-05-24 MED ORDER — DOXYCYCLINE HYCLATE 100 MG PO TABS
100.0000 mg | ORAL_TABLET | Freq: Two times a day (BID) | ORAL | Status: AC
  Administered 2023-05-24 – 2023-05-26 (×6): 100 mg via ORAL
  Filled 2023-05-24 (×6): qty 1

## 2023-05-24 MED ORDER — PANTOPRAZOLE SODIUM 40 MG PO TBEC
40.0000 mg | DELAYED_RELEASE_TABLET | Freq: Every day | ORAL | Status: DC
  Administered 2023-05-24 – 2023-05-27 (×4): 40 mg via ORAL
  Filled 2023-05-24 (×4): qty 1

## 2023-05-24 MED ORDER — FUROSEMIDE 20 MG PO TABS
20.0000 mg | ORAL_TABLET | Freq: Every day | ORAL | Status: DC
  Administered 2023-05-24 – 2023-05-28 (×5): 20 mg via ORAL
  Filled 2023-05-24 (×5): qty 1

## 2023-05-24 MED ORDER — ASPIRIN 325 MG PO TBEC: 325.0000 mg | DELAYED_RELEASE_TABLET | Freq: Once | ORAL | Status: DC

## 2023-05-24 MED ORDER — PREDNISONE 20 MG PO TABS
30.0000 mg | ORAL_TABLET | Freq: Every day | ORAL | Status: DC
  Administered 2023-05-24 – 2023-05-28 (×5): 30 mg via ORAL
  Filled 2023-05-24 (×5): qty 1

## 2023-05-24 MED ORDER — MAGNESIUM SULFATE 4 GM/100ML IV SOLN
4.0000 g | Freq: Once | INTRAVENOUS | Status: AC
  Administered 2023-05-24: 4 g via INTRAVENOUS
  Filled 2023-05-24: qty 100

## 2023-05-24 MED ORDER — IPRATROPIUM-ALBUTEROL 0.5-2.5 (3) MG/3ML IN SOLN: 3.0000 mL | Freq: Two times a day (BID) | RESPIRATORY_TRACT | Status: DC

## 2023-05-24 NOTE — Evaluation (Signed)
Physical Therapy Evaluation Patient Details Name: Lisa [name] MRN: [id number] DOB: 07-29-1934 Today's Date: 05/24/2023  History of Present Illness  89 y.o. female with medical history significant for COPD c/b chronic hypoxic respiratory failure on 2L baseline, HFpEF, CKD3b, Paroxysmal Afib, and MDS/CMML c/b chronic neutropenia and thrombocytopenia, and chronic hyperkalemia, who presented with weakness and was admitted for acute on chronic hypoxic and hypercapnic respiratory failure and COPD exacerbation.     [name] was recently admitted 6/4-6/7 for an NSTEMI, though her course was also complicated by acute blood loss anemia (discharged with PPI BID per GI) and hyperkalemia (discharged with every other day [facility]).     On 7/18, [name] presented after EMS was called by her family because she had become progressively weaker over the prior 2 days. Also endorsed decreased appetite, increased confusion, sleeping throughout the day, and ultimately found to have increased O2 requirement after family checked O2 sats in 70s after [name] collapsed to the floor.     On presentation to ED, [name] was advanced to 6L to maintain O2 sats >90%, duonebs, and NaHCO3 after ABG showed 7.23/65/46/26. Subsequent ABG worsened to 7.22/72/<31/30 and she was moved to BiPAP as she was brought into ICU. Her K was also elevated at 6.5, without EKG changes, given Calcium gluconate, Lokelma 5g and 2x 1L LR boluses. 7/18 CXR c/w moderate perihilar and lower lobe interstitial pulmonary edema. BNP elevated at 1095 with a normal lactate. Also found to have stable on chronic neutropenia with ANC 200, and stable on chronic thrombocytopenia with PLT at 101k. Temp has fluctuated 97.2 to 101.4. Her 7/18 CT Head revealed no acute intracranial process.       Over the course of afternoon and night of 7/18, consulted PCCM for BiPAP settings. Continued to manage K with Lasix and Lokelma. This morning, 7/19, K is at 6.0 and Na has  improved to 128. Per PCCM recs, will trial pt off of Bipap today, repeat CXR, and continue IV Lasix 40mg . Nephrology adding fludrocortisone and increasing Lokelma to BID for hyperkalemia.   Clinical Impression  Pt tolerated treatment well. Required mod assist to transfer from bed to chair and ambulate with use of RW.  O2 monitored throughout. Pt tolerated sitting in chair after therapy session. Patient will benefit from continued skilled physical therapy in hospital and recommended venue below to increase strength, balance, endurance for safe ADLs and gait.       Assistance Recommended at Discharge Set up Supervision/Assistance  If plan is discharge home, recommend the following:  Can travel by private vehicle  A lot of help with walking and/or transfers;A lot of help with bathing/dressing/bathroom;Assistance with cooking/housework;Assist for transportation        Equipment Recommendations Rolling walker (2 wheels)  Recommendations for Other Services       Functional Status Assessment Patient has had a recent decline in their functional status and demonstrates the ability to make significant improvements in function in a reasonable and predictable amount of time.     Precautions / Restrictions Precautions Precautions: Fall Restrictions Weight Bearing Restrictions: No      Mobility  Bed Mobility Overal bed mobility: Needs Assistance Bed Mobility: Supine to Sit     Supine to sit: Modified independent (Device/Increase time)     General bed mobility comments: increased time and labored    Transfers Overall transfer level: Needs assistance Equipment used: Rolling walker (2 wheels) Transfers: Sit to/from Stand, Bed to chair/wheelchair/BSC Sit to Stand: Modified independent (Device/Increase  time)   Step pivot transfers: Modified independent (Device/Increase time)       General transfer comment: use of RW during transfers    Ambulation/Gait Ambulation/Gait assistance:  Mod assist, Min assist Gait Distance (Feet): 20 Feet Assistive device: Rolling walker (2 wheels) Gait Pattern/deviations: Step-through pattern, Decreased step length - right, Decreased step length - left, Decreased stride length Gait velocity: slowed     General Gait Details: use of RW, slightly labored  Stairs            Wheelchair Mobility     Tilt Bed    Modified Rankin (Stroke Patients Only)       Balance Overall balance assessment: Needs assistance Sitting-balance support: Feet supported, Single extremity supported Sitting balance-Leahy Scale: Fair Sitting balance - Comments: fair balance seated EOB     Standing balance-Leahy Scale: Fair Standing balance comment: fair balance standing at bedside with use of RW                             Pertinent Vitals/Pain Pain Assessment Pain Assessment: No/denies pain    Home Living Family/patient expects to be discharged to:: Private residence Living Arrangements: Children Available Help at Discharge: Family;Available 24 hours/day Type of Home: House Home Access: Stairs to enter;Other (comment) (Pt states there are 2 levels of the house but only stays on the first floor)   Entrance Stairs-Number of Steps: 4   Home Layout: Two level Home Equipment: Rolling Walker (2 wheels);Cane - quad;BSC/3in1;Grab bars - tub/shower;Wheelchair - manual;Other (comment) Additional Comments: Pt reports living with daughter . Pt on supplemental O2 at home.    Prior Function Prior Level of Function : Needs assist       Physical Assist : Mobility (physical);ADLs (physical) Mobility (physical): Transfers;Gait;Bed mobility;Stairs ADLs (physical): IADLs;Bathing;Dressing Mobility Comments: household and short distanced community ambulator using quad-cane or RW ADLs Comments: Assist needed for bathing. Independnet goorming, feeding, dressing, toileting. Assted for IADL's by family.     Hand Dominance         Extremity/Trunk Assessment                Communication   Communication: HOH  Cognition Arousal/Alertness: Awake/alert Behavior During Therapy: WFL for tasks assessed/performed Overall Cognitive Status: Within Functional Limits for tasks assessed                                          General Comments      Exercises     Assessment/Plan    PT Assessment Patient needs continued PT services  PT Problem List Decreased strength;Decreased activity tolerance;Decreased balance;Decreased mobility       PT Treatment Interventions DME instruction;Therapeutic exercise;Gait training;Functional mobility training;Therapeutic activities;Balance training    PT Goals (Current goals can be found in the Care Plan section)  Acute Rehab PT Goals Patient Stated Goal: Return home with assist of family PT Goal Formulation: With patient Time For Goal Achievement: 06/07/23 Potential to Achieve Goals: Good    Frequency Min 3X/week     Co-evaluation               AM-PAC PT "6 Clicks" Mobility  Outcome Measure Help needed turning from your back to your side while in a flat bed without using bedrails?: A Little Help needed moving from lying on your back to sitting on the side of  a flat bed without using bedrails?: A Little Help needed moving to and from a bed to a chair (including a wheelchair)?: A Lot Help needed standing up from a chair using your arms (e.g., wheelchair or bedside chair)?: A Lot Help needed to walk in hospital room?: A Lot Help needed climbing 3-5 steps with a railing? : Total 6 Click Score: 13    End of Session   Activity Tolerance: Patient tolerated treatment well;Patient limited by fatigue Patient left: in chair;with call bell/phone within reach Nurse Communication: Mobility status PT Visit Diagnosis: Unsteadiness on feet (R26.81);Muscle weakness (generalized) (M62.81);Difficulty in walking, not elsewhere classified (R26.2)    Time:  1914-7829 PT Time Calculation (min) (ACUTE ONLY): 31 min   Charges:   PT Evaluation $PT Eval Moderate Complexity: 1 Mod PT Treatments $Therapeutic Activity: 23-37 mins PT General Charges $$ ACUTE PT VISIT: 1 Visit         Lener Ventresca SPT [place], DPT Program

## 2023-05-24 NOTE — [place] FL2 (Signed)
[place] MEDICAID FL2 LEVEL OF CARE FORM     IDENTIFICATION  Patient Name: Lisa [name] Birthdate: 05-05-1934 Sex: female Admission Date (Current Location): 05/20/2023  [facility] and [place] Number:  [employer] and Address:  [facility],  [address] [place] [address]      Provider Number: [id number]  Attending Physician Name and Address:  Cleora Fleet, MD  Relative Name and Phone Number:       Current Level of Care: Hospital Recommended Level of Care: Skilled Nursing Facility Prior Approval Number:    Date Approved/Denied:   PASRR Number: [id number]  Discharge Plan: SNF    Current Diagnoses: Patient Active Problem List   Diagnosis Date Noted   CAP (community acquired pneumonia) 05/20/2023   CAD (coronary artery disease) 05/20/2023   History of non-ST elevation myocardial infarction (NSTEMI) 05/20/2023   Metabolic acidosis 05/20/2023   Elevated brain natriuretic peptide (BNP) level 05/20/2023   Fever, unspecified 05/20/2023   Altered mental status 05/20/2023   NSTEMI (non-ST elevated myocardial infarction) (HCC) 04/06/2023   Nonrheumatic aortic valve stenosis 11/20/2022   Mitral regurgitation 11/20/2022   Diarrhea 11/07/2022   Pancytopenia (HCC) 11/03/2022   Hyperkalemia 11/03/2022   Acute on chronic diastolic CHF (congestive heart failure) (HCC) 02/11/2022   TIA (transient ischemic attack) 02/10/2022   Myelodysplastic syndrome (HCC) 05/14/2021   Hyponatremia    Labile blood pressure    Debility 10/30/2020   Anemia of chronic disease    Paroxysmal atrial fibrillation (HCC)    Malnutrition of moderate degree 10/28/2020   Bilateral knee effusions    FTT (failure to thrive) in adult    Decreased appetite    Weakness    Gout 10/21/2020   Hypothyroidism 10/21/2020   HTN (hypertension) 10/21/2020   Weight loss 10/21/2020   Generalized muscle weakness 10/21/2020   Osteoarthritis 10/21/2020   Atrial fibrillation with RVR  (HCC) 10/21/2020   Deficiency anemia 03/13/2019   Chronic respiratory failure with hypoxia (HCC) 05/31/2017   Hyperglycemia 05/31/2017   Chronic renal failure (CRF), stage 3b (HCC) 05/31/2017   Normocytic anemia 05/31/2017   COPD with acute exacerbation (HCC) 05/31/2017   Abdominal aortic aneurysm 05/31/2017   Urinary incontinence 05/31/2017   Vitamin D deficiency 06/10/2016   Neutropenia (HCC) 06/09/2016   Invasive ductal carcinoma of left breast (HCC) 06/09/2016   Adenocarcinoma of left lung (HCC) 06/09/2016   Malignant neoplasm of unspecified site of unspecified female breast (HCC) 06/09/2016   Adenomatous polyps 02/08/2013    Orientation RESPIRATION BLADDER Height & Weight     Self, Time, Situation, Place  O2 (2L) Continent Weight: 166 lb 3.6 oz (75.4 kg) Height:  5\' 3"  (160 cm)  BEHAVIORAL SYMPTOMS/MOOD NEUROLOGICAL BOWEL NUTRITION STATUS      Continent Diet (see dc summary)  AMBULATORY STATUS COMMUNICATION OF NEEDS Skin   Extensive Assist Verbally Normal                       Personal Care Assistance Level of Assistance  Bathing, Feeding, Dressing Bathing Assistance: Limited assistance Feeding assistance: Independent Dressing Assistance: Limited assistance     Functional Limitations Info  Sight, Hearing, Speech Sight Info: Adequate Hearing Info: Adequate Speech Info: Adequate    SPECIAL CARE FACTORS FREQUENCY  PT (By licensed PT), OT (By licensed OT)     PT Frequency: 5x week OT Frequency: 5x week            Contractures Contractures Info: Not present  Additional Factors Info  Code Status, Allergies Code Status Info: Full Allergies Info: Meloxicam, Micardis Hct           Current Medications (05/24/2023):  This is the current hospital active medication list Current Facility-Administered Medications  Medication Dose Route Frequency Provider Last Rate Last Admin   acetaminophen (TYLENOL) tablet 650 mg  650 mg Oral Q6H PRN Johnson, Clanford L,  MD       Or   acetaminophen (TYLENOL) suppository 650 mg  650 mg Rectal Q6H PRN Johnson, Clanford L, MD       bisacodyl (DULCOLAX) EC tablet 5 mg  5 mg Oral Daily PRN Johnson, Clanford L, MD       budesonide (PULMICORT) nebulizer solution 0.5 mg  0.5 mg Nebulization BID Mannam, Praveen, MD   0.5 mg at 05/24/23 0707   doxycycline (VIBRA-TABS) tablet 100 mg  100 mg Oral Q12H Johnson, Clanford L, MD   100 mg at 05/24/23 4034   fludrocortisone (FLORINEF) tablet 0.1 mg  0.1 mg Oral Daily Annie Sable, MD   0.1 mg at 05/24/23 0809   furosemide (LASIX) tablet 20 mg  20 mg Oral Daily Johnson, Clanford L, MD   20 mg at 05/24/23 1345   ipratropium-albuterol (DUONEB) 0.5-2.5 (3) MG/3ML nebulizer solution 3 mL  3 mL Nebulization Q6H Johnson, Clanford L, MD   3 mL at 05/24/23 1338   ipratropium-albuterol (DUONEB) 0.5-2.5 (3) MG/3ML nebulizer solution 3 mL  3 mL Nebulization Q2H PRN Johnson, Clanford L, MD       isosorbide mononitrate (IMDUR) 24 hr tablet 30 mg  30 mg Oral Daily Johnson, Clanford L, MD   30 mg at 05/24/23 7425   levothyroxine (SYNTHROID) tablet 100 mcg  100 mcg Oral QAC breakfast Laural Benes, Clanford L, MD   100 mcg at 05/24/23 0516   metoprolol succinate (TOPROL-XL) 24 hr tablet 25 mg  25 mg Oral Daily Johnson, Clanford L, MD   25 mg at 05/24/23 0809   mirtazapine (REMERON) tablet 15 mg  15 mg Oral QHS Johnson, Clanford L, MD   15 mg at 05/23/23 2243   ondansetron (ZOFRAN) tablet 4 mg  4 mg Oral Q6H PRN Johnson, Clanford L, MD       Or   ondansetron (ZOFRAN) injection 4 mg  4 mg Intravenous Q6H PRN Johnson, Clanford L, MD       Oral care mouth rinse  15 mL Mouth Rinse 4 times per day Laural Benes, Clanford L, MD   15 mL at 05/24/23 1227   Oral care mouth rinse  15 mL Mouth Rinse PRN Johnson, Clanford L, MD       oxyCODONE (Oxy IR/ROXICODONE) immediate release tablet 5 mg  5 mg Oral Q6H PRN Johnson, Clanford L, MD       pantoprazole (PROTONIX) EC tablet 40 mg  40 mg Oral QHS Johnson, Clanford  L, MD       pravastatin (PRAVACHOL) tablet 10 mg  10 mg Oral QPM Johnson, Clanford L, MD   10 mg at 05/23/23 1754   predniSONE (DELTASONE) tablet 30 mg  30 mg Oral Q breakfast Laural Benes, Clanford L, MD   30 mg at 05/24/23 9563     Discharge Medications: Please see discharge summary for a list of discharge medications.  Relevant Imaging Results:  Relevant Lab Results:   Additional Information SSN: [id number]  Elliot Gault, LCSW

## 2023-05-24 NOTE — Consult Note (Signed)
Consultation Note Date: 05/24/2023   Patient Name: Lisa [name]  DOB: November 21, 1933  MRN: [id number]  Age / Sex: 89 y.o., female  PCP: [name], MD Referring Physician: Cleora Fleet, MD  Reason for Consultation: Establishing goals of care  HPI/Patient Profile: 89 y.o. female  with past medical history of COPD secondary to chronic hypoxic respiratory failure on 2 L at baseline, admitted 6/4 through 7 for NSTEMI and acute blood loss anemia, HFpEF, CKD 3, paroxysmal A-fib, and MDS/CMML with neutropenia and thrombocytopenia, history of breast cancer 1999, adenocarcinoma of left lung 2006, admitted on 05/20/2023 with acute on chronic hypoxic/hypercapnic respiratory failure with COPD exacerbation,, and acute diastolic heart failure.   Clinical Assessment and Goals of Care: I have reviewed medical records including EPIC notes, labs and imaging, received report from RN, assessed the patient.  Mrs. Deines is sitting up in the [place] chair in her room.  She appears acutely/chronically ill and somewhat frail.  She is resting comfortably, but wakes easily when I call her name and touch her arm.  She is alert and oriented, able to make her needs known.  There is no family at bedside at this time.  Call to daughter, Laura Caldas.  Left voicemail message requesting return call. Call to son, Sherly Brodbeck.  Left voicemail message requesting return call.  Per transition of care team notes 7/18, patient is from Home with family.  She has 24/7 care.  She walks with a walker and is on home oxygen.  Family is able to transport her to appointments.  They also assist with ADLs.   encouraged to call with questions or concerns.  PMT will continue to support holistically.  Conference with attending, bedside nursing staff, PT, transition of care team related to patient condition, needs, goals of care,  disposition.    HCPOA NEXT OF KIN    SUMMARY OF RECOMMENDATIONS   At this point continue full scope/full code Anticipate home with 24/7 caregiving Would benefit from outpatient palliative services   Code Status/Advance Care Planning: Full code  Symptom Management:  Per hospitalist, no additional needs at this time.  Palliative Prophylaxis:  Frequent Pain Assessment and Oral Care  Additional Recommendations (Limitations, Scope, Preferences): Full Scope Treatment  Psycho-social/Spiritual:  Desire for further Chaplaincy support:no Additional Recommendations: Caregiving  Support/Resources  Prognosis:  Unable to determine, based on outcomes.  Discharge Planning: To be determined, anticipate home      Primary Diagnoses: Present on Admission:  CAP (community acquired pneumonia)  Invasive ductal carcinoma of left breast (HCC)  HTN (hypertension)  Pancytopenia (HCC)  Neutropenia (HCC)  Adenocarcinoma of left lung (HCC)  Chronic respiratory failure with hypoxia (HCC)  Chronic renal failure (CRF), stage 3b (HCC)  Normocytic anemia  Hypothyroidism  FTT (failure to thrive) in adult  Paroxysmal atrial fibrillation (HCC)  Hyponatremia  Myelodysplastic syndrome (HCC)  Hyperkalemia  CAD (coronary artery disease)  Metabolic acidosis  Elevated brain natriuretic peptide (BNP) level  Fever, unspecified  Altered mental status   I have reviewed the  medical record, interviewed the patient and family, and examined the patient. The following aspects are pertinent.  Past Medical History:  Diagnosis Date   Adenocarcinoma of left lung (HCC) 2006   Arthritis    Asthma    Cancer of breast, female (HCC)    Cancer of lung (HCC)    Dysrhythmia    Hypertension    Hypothyroidism    Invasive ductal carcinoma of left breast (HCC) 1999   Myocardial infarction (HCC)    Neutropenia (HCC) 06/09/2016   Personal history of radiation therapy    Social History   Socioeconomic  History   Marital status: Widowed    Spouse name: Not on file   Number of children: Not on file   Years of education: Not on file   Highest education level: Not on file  Occupational History   Not on file  Tobacco Use   Smoking status: Former    Current packs/day: 0.00    Average packs/day: 0.8 packs/day for 35.0 years (26.3 ttl pk-yrs)    Types: Cigarettes    Start date: 21    Quit date: 37    Years since quitting: 31.5   Smokeless tobacco: Never   Tobacco comments:    smoke-free X 30 yeras  Vaping Use   Vaping status: Never Used  Substance and Sexual Activity   Alcohol use: No   Drug use: No   Sexual activity: Not Currently  Other Topics Concern   Not on file  Social History Narrative   Not on file   Social Determinants of Health   Financial Resource Strain: Not on file  Food Insecurity: No Food Insecurity (05/20/2023)   Hunger Vital Sign    Worried About Running Out of Food in the Last Year: Never true    Ran Out of Food in the Last Year: Never true  Transportation Needs: No Transportation Needs (05/21/2023)   PRAPARE - Administrator, Civil Service (Medical): No    Lack of Transportation (Non-Medical): No  Physical Activity: Inactive (12/12/2020)   Exercise Vital Sign    Days of Exercise per Week: 0 days    Minutes of Exercise per Session: 0 min  Stress: Not on file  Social Connections: Not on file   Family History  Problem Relation Age of Onset   Cancer Mother    Cancer Sister    Colon cancer Neg Hx    Scheduled Meds:  budesonide (PULMICORT) nebulizer solution  0.5 mg Nebulization BID   doxycycline  100 mg Oral Q12H   fludrocortisone  0.1 mg Oral Daily   ipratropium-albuterol  3 mL Nebulization Q6H   isosorbide mononitrate  30 mg Oral Daily   levothyroxine  100 mcg Oral QAC breakfast   metoprolol succinate  25 mg Oral Daily   mirtazapine  15 mg Oral QHS   mouth rinse  15 mL Mouth Rinse 4 times per day   pantoprazole  40 mg Oral QHS    pravastatin  10 mg Oral QPM   predniSONE  30 mg Oral Q breakfast   Continuous Infusions: PRN Meds:.acetaminophen **OR** acetaminophen, bisacodyl, ipratropium-albuterol, ondansetron **OR** ondansetron (ZOFRAN) IV, mouth rinse, oxyCODONE Medications Prior to Admission:  Prior to Admission medications   Medication Sig Start Date End Date Taking? Authorizing Provider  acetaminophen (TYLENOL) 325 MG tablet Take 2 tablets (650 mg total) by mouth every 6 (six) hours. 11/11/20  Yes Angiulli, Mcarthur Rossetti, PA-C  albuterol (VENTOLIN HFA) 108 (90 Base) MCG/ACT inhaler Inhale 1-2  puffs into the lungs every 4 (four) hours as needed for wheezing or shortness of breath. 05/25/22  Yes [provider]  Fluticasone-Umeclidin-Vilant (TRELEGY ELLIPTA) 100-62.5-25 MCG/ACT AEPB Inhale 1 puff into the lungs daily. 12/18/22  Yes Oretha Milch, MD  furosemide (LASIX) 20 MG tablet Take 1 tablet (20 mg total) by mouth daily. 04/09/23  Yes Johnson, Clanford L, MD  gabapentin (NEURONTIN) 100 MG capsule Take 1 capsule (100 mg total) by mouth every 12 (twelve) hours as needed (neuropathy). Patient taking differently: Take 100 mg by mouth 3 (three) times daily as needed (neuropathy). 02/11/22 05/20/23 Yes Vassie Loll, MD  isosorbide mononitrate (IMDUR) 30 MG 24 hr tablet Take 1 tablet (30 mg total) by mouth daily. 04/10/23  Yes Johnson, Clanford L, MD  levothyroxine (SYNTHROID) 100 MCG tablet Take 1 tablet (100 mcg total) by mouth daily before breakfast. 08/28/21  Yes Johnson, Clanford L, MD  LOKELMA 10 g PACK packet Take 10 g by mouth every other day. 04/12/23  Yes Johnson, Clanford L, MD  magnesium oxide (MAG-OX) 400 MG tablet Take 400 mg by mouth 2 (two) times daily.   Yes [provider]  metoprolol succinate (TOPROL-XL) 25 MG 24 hr tablet TAKE 1 TABLET DAILY 02/18/23  Yes Mallipeddi, Vishnu P, MD  mirtazapine (REMERON) 7.5 MG tablet Take 1 tablet (7.5 mg total) by mouth at bedtime. Patient taking differently: Take  15 mg by mouth at bedtime. 05/28/21  Yes Artis Delay, MD  Multiple Vitamin (MULTI-VITAMIN) tablet Take 1 tablet by mouth daily.   Yes [provider]  pantoprazole (PROTONIX) 40 MG tablet Take 1 tablet (40 mg total) by mouth 2 (two) times daily. 04/09/23  Yes Johnson, Clanford L, MD  pravastatin (PRAVACHOL) 10 MG tablet Take 1 tablet (10 mg total) by mouth every evening. 04/09/23  Yes Johnson, Clanford L, MD   Allergies  Allergen Reactions   Meloxicam Other (See Comments)    Caused an injury to the kidneys, per nephrologist   Micardis Hct [Telmisartan-Hctz] Other (See Comments)    Caused an injury to the kidneys, per nephrologist   Review of Systems  Unable to perform ROS: Age    Physical Exam Vitals and nursing note reviewed.  Constitutional:      Comments: Weak and frail   Pulmonary:     Effort: Pulmonary effort is normal. No respiratory distress.     Vital Signs: BP (!) 169/94 (BP Location: Left Arm)   Pulse 95   Temp 98.3 F (36.8 C) (Oral)   Resp (!) 34   Ht 5\' 3"  (1.6 m)   Wt 75.4 kg   SpO2 92%   BMI 29.45 kg/m  Pain Scale: 0-10 POSS *See Group Information*: 1-Acceptable,Awake and alert Pain Score: 0-No pain   SpO2: SpO2: 92 % O2 Device:SpO2: 92 % O2 Flow Rate: .O2 Flow Rate (L/min): 2 L/min  IO: Intake/output summary:  Intake/Output Summary (Last 24 hours) at 05/24/2023 1204 Last data filed at 05/24/2023 1191 Gross per 24 hour  Intake 120 ml  Output 1100 ml  Net -980 ml    LBM: Last BM Date : 05/23/23 Baseline Weight: Weight: 75.3 kg Most recent weight: Weight: 75.4 kg     Palliative Assessment/Data:     Time In: 0900 Time Out: 0940 Time Total: 40 minutes  Greater than 50%  of this time was spent counseling and coordinating care related to the above assessment and plan.  Signed by: Katheran Awe, NP   Please contact Palliative Medicine  Team phone at [phone] for questions and concerns.  For individual provider: See Loretha Stapler

## 2023-05-24 NOTE — Progress Notes (Signed)
Patient slept at 0200 , did not awaken for neb. Did not use BiPAP tonight.

## 2023-05-24 NOTE — TOC Initial Note (Signed)
Transition of Care [facility]) - Initial/Assessment Note    Patient Details  Name: Lisa [name] MRN: [id number] Date of Birth: May 11, 1934  Transition of Care [facility] CM/SW Contact:    Elliot Gault, LCSW Phone Number: 05/24/2023, 2:11 PM  Clinical Narrative:                  Pt readmitted after recent DC. During previous admission, pt/family declined SNF rehab and [facility]. PT again recommending SNF rehab at dc.  Spoke with pt at bedside to assess. She states that she prefers to go home at dc though did give consent for TOC to speak with her family.  Spoke with pt's grandson on conference call with Dr. Laural Benes. Grandson agrees to SNF rehab referrals and he will discuss with pt this evening. Grandson aware that if pt ultimately declines the rehab placement, DC plan can be changed. CMS provider options reviewed and will refer as requested.  MD anticipating possible dc tomorrow. Will follow.  Expected Discharge Plan: Skilled Nursing Facility Barriers to Discharge: Continued Medical Work up   Patient Goals and CMS Choice Patient states their goals for this hospitalization and ongoing recovery are:: SNF rehab CMS Medicare.gov Compare Post Acute Care list provided to:: Patient Represenative (must comment) Choice offered to / list presented to : Adult Children [place] ownership interest in [facility].provided to:: Adult Children    Expected Discharge Plan and Services In-house Referral: Clinical Social Work   Post Acute Care Choice: Skilled Nursing Facility Living arrangements for the past 2 months: Single Family Home                                      Prior Living Arrangements/Services Living arrangements for the past 2 months: Single Family Home Lives with:: Adult Children Patient language and need for interpreter reviewed:: Yes Do you feel safe going back to the place where you live?: Yes      Need for Family Participation in Patient Care: Yes (Comment) Care  giver support system in place?: Yes (comment) Current home services: DME Criminal Activity/Legal Involvement Pertinent to Current Situation/Hospitalization: No - Comment as needed  Activities of Daily Living Home Assistive Devices/Equipment: Dan Humphreys (specify type) ADL Screening (condition at time of admission) Patient's cognitive ability adequate to safely complete daily activities?: Yes Is the patient deaf or have difficulty hearing?: Yes Does the patient have difficulty seeing, even when wearing glasses/contacts?: Yes Does the patient have difficulty concentrating, remembering, or making decisions?: Yes Patient able to express need for assistance with ADLs?: Yes Does the patient have difficulty dressing or bathing?: Yes Independently performs ADLs?: No Communication: Needs assistance Is this a change from baseline?: Pre-admission baseline Dressing (OT): Needs assistance Is this a change from baseline?: Pre-admission baseline Grooming: Needs assistance Is this a change from baseline?: Pre-admission baseline Feeding: Needs assistance Is this a change from baseline?: Pre-admission baseline Bathing: Needs assistance Is this a change from baseline?: Pre-admission baseline Toileting: Needs assistance Is this a change from baseline?: Pre-admission baseline In/Out Bed: Needs assistance Is this a change from baseline?: Pre-admission baseline Walks in Home: Needs assistance Is this a change from baseline?: Pre-admission baseline Does the patient have difficulty walking or climbing stairs?: Yes Weakness of Legs: Both Weakness of Arms/Hands: Both  Permission Sought/Granted Permission sought to share information with : Facility Medical sales representative    Share Information with NAME: son, Amorette Charrette  Permission granted to  share info w AGENCY: SNFs        Emotional Assessment Appearance:: Appears stated age Attitude/Demeanor/Rapport: Engaged Affect (typically observed):  Pleasant Orientation: : Oriented to Self, Oriented to Place, Oriented to  Time, Oriented to Situation Alcohol / Substance Use: Not Applicable Psych Involvement: No (comment)  Admission diagnosis:  CAP (community acquired pneumonia) [J18.9] Neutropenic fever (HCC) [D70.9, R50.81] Pneumonia of right middle lobe due to infectious organism [J18.9] Sepsis, due to unspecified organism, unspecified whether acute organ dysfunction present [facility]) [A41.9] Patient Active Problem List   Diagnosis Date Noted   CAP (community acquired pneumonia) 05/20/2023   CAD (coronary artery disease) 05/20/2023   History of non-ST elevation myocardial infarction (NSTEMI) 05/20/2023   Metabolic acidosis 05/20/2023   Elevated brain natriuretic peptide (BNP) level 05/20/2023   Fever, unspecified 05/20/2023   Altered mental status 05/20/2023   NSTEMI (non-ST elevated myocardial infarction) (HCC) 04/06/2023   Nonrheumatic aortic valve stenosis 11/20/2022   Mitral regurgitation 11/20/2022   Diarrhea 11/07/2022   Pancytopenia (HCC) 11/03/2022   Hyperkalemia 11/03/2022   Acute on chronic diastolic CHF (congestive heart failure) (HCC) 02/11/2022   TIA (transient ischemic attack) 02/10/2022   Myelodysplastic syndrome (HCC) 05/14/2021   Hyponatremia    Labile blood pressure    Debility 10/30/2020   Anemia of chronic disease    Paroxysmal atrial fibrillation (HCC)    Malnutrition of moderate degree 10/28/2020   Bilateral knee effusions    FTT (failure to thrive) in adult    Decreased appetite    Weakness    Gout 10/21/2020   Hypothyroidism 10/21/2020   HTN (hypertension) 10/21/2020   Weight loss 10/21/2020   Generalized muscle weakness 10/21/2020   Osteoarthritis 10/21/2020   Atrial fibrillation with RVR (HCC) 10/21/2020   Deficiency anemia 03/13/2019   Chronic respiratory failure with hypoxia (HCC) 05/31/2017   Hyperglycemia 05/31/2017   Chronic renal failure (CRF), stage 3b (HCC) 05/31/2017   Normocytic  anemia 05/31/2017   COPD with acute exacerbation (HCC) 05/31/2017   Abdominal aortic aneurysm 05/31/2017   Urinary incontinence 05/31/2017   Vitamin D deficiency 06/10/2016   Neutropenia (HCC) 06/09/2016   Invasive ductal carcinoma of left breast (HCC) 06/09/2016   Adenocarcinoma of left lung (HCC) 06/09/2016   Malignant neoplasm of unspecified site of unspecified female breast (HCC) 06/09/2016   Adenomatous polyps 02/08/2013   PCP:  [name], MD Pharmacy:   [facility] - [place], [place] - [address] [place] [address] Phone: [phone] Fax: [phone]  [facility] (NOT MAIL [facility]) - [place], [place] - [address] [place] [address] Phone: [phone] Fax: [phone]  [facility] - [place], [place] - [address] [place] [address] Phone: [phone] Fax: [phone]     Social Determinants of Health (SDOH) Social History: SDOH Screenings   Food Insecurity: No Food Insecurity (05/20/2023)  Housing: Low Risk  (05/20/2023)  Transportation Needs: No Transportation Needs (05/21/2023)  Utilities: Not At Risk (05/20/2023)  Depression (PHQ2-9): Low Risk  (12/12/2020)  Physical Activity: Inactive (12/12/2020)  Tobacco Use: Medium Risk (05/24/2023)   SDOH Interventions:     Readmission Risk Interventions    05/24/2023    2:10 PM 08/28/2021    1:06 PM  Readmission Risk Prevention Plan  Transportation Screening Complete Complete  HRI or Home Care Consult Complete Complete  Social Work Consult for Recovery Care Planning/Counseling Complete Complete  Palliative Care Screening Complete Not Applicable  Medication Review Oceanographer) Complete Complete

## 2023-05-24 NOTE — Plan of Care (Signed)
  Problem: Acute Rehab PT Goals(only PT should resolve) Goal: Pt Will Go Supine/Side To Sit Outcome: Progressing Flowsheets (Taken 05/24/2023 1150) Pt will go Supine/Side to Sit: with minimal assist Goal: Patient Will Transfer Sit To/From Stand Outcome: Progressing Flowsheets (Taken 05/24/2023 1150) Patient will transfer sit to/from stand: with minimal assist Goal: Pt Will Transfer Bed To Chair/Chair To Bed Outcome: Progressing Flowsheets (Taken 05/24/2023 1150) Pt will Transfer Bed to Chair/Chair to Bed: with min assist Goal: Pt Will Ambulate Outcome: Progressing Flowsheets (Taken 05/24/2023 1150) Pt will Ambulate:  50 feet  with minimal assist  with moderate assist   Lisa [name] SPT [employer], DPT Program

## 2023-05-24 NOTE — Progress Notes (Addendum)
Pt is c/o chest pain that radiates to her back as a burning sensation and a dull pain. Pt grandson says it started within the past hour. EKG showed and it showed A-flutter. Dr. Laural Benes notified of episode.

## 2023-05-24 NOTE — Progress Notes (Signed)
PROGRESS NOTE   Lisa [name]  ZOX:[id number] DOB: 1934/02/18 DOA: 05/20/2023 PCP: [name], MD   Chief Complaint  Patient presents with   Altered Mental Status   Level of care: Telemetry  Brief Admission History:  89 y.o. female with medical history significant for COPD c/b chronic hypoxic respiratory failure on 2L baseline, HFpEF, CKD3b, Paroxysmal Afib, and MDS/CMML c/b chronic neutropenia and thrombocytopenia, and chronic hyperkalemia, who presented with weakness and was admitted for acute on chronic hypoxic and hypercapnic respiratory failure and COPD exacerbation.   Ms Lisa [name] was recently admitted 6/4-6/7 for an NSTEMI, though her course was also complicated by acute blood loss anemia (discharged with PPI BID per GI) and hyperkalemia (discharged with every other day [facility]).   On 7/18, Ms Lisa [name] presented after EMS was called by her family because she had become progressively weaker over the prior 2 days. Also endorsed decreased appetite, increased confusion, sleeping throughout the day, and ultimately found to have increased O2 requirement after family checked O2 sats in 70s after Ms Lisa [name] collapsed to the floor.   On presentation to ED, Ms Lisa [name] was advanced to 6L to maintain O2 sats >90%, duonebs, and NaHCO3 after ABG showed 7.23/65/46/26. Subsequent ABG worsened to 7.22/72/<31/30 and she was moved to BiPAP as she was brought into ICU. Her K was also elevated at 6.5, without EKG changes, given Calcium gluconate, Lokelma 5g and 2x 1L LR boluses. 7/18 CXR c/w moderate perihilar and lower lobe interstitial pulmonary edema. BNP elevated at 1095 with a normal lactate. Also found to have stable on chronic neutropenia with ANC 200, and stable on chronic thrombocytopenia with PLT at 101k. Temp has fluctuated 97.2 to 101.4. Her 7/18 CT Head revealed no acute intracranial process.     Over the course of afternoon and night of 7/18, consulted PCCM for BiPAP settings. Continued to manage  K with Lasix and Lokelma. This morning, 7/19, K is at 6.0 and Na has improved to 128. Per PCCM recs, will trial pt off of Bipap today, repeat CXR, and continue IV Lasix 40mg . Nephrology adding fludrocortisone and increasing Lokelma to BID for hyperkalemia.    Assessment and Plan:  Acute on chronic hypoxic and hypercapnic respiratory failure c/b COPD Exacerbation, CAP and acute diastolic heart failure Pt presented with ABG 7.23/65/46/27, with increased supplemental O2 requirement from 2L baseline to 4L in ED. 7/18 CXR with moderate perihilar lower lobe interstitial infiltrates c/f pulmonary edema. Given her recent NSTEMI last month, the CXR, and BNP of 1095, was suspicious of acute HFpEF exacerbation, but not matching clinical exam and pt handled initial 1L LR boluses in ED well. 7/19 Echo with preserved EF however did demonstrate severe pulmonary artery hypertension.  Appreciate PCCM recs, trial off bipap today, changed to PRN; Continuing with IV 30mg  Lasix x 1 dose. Adding incentive spirometry and flutter valve.  - Ceftriaxone and Azithromycin initiated 7/19  - Repeated CXR with bilateral pulmonary infiltrates and pleural effusions - treated with IV furosemide with good results  - Pulmicort 0.5 BID - IV Methylprednisolone completed, started oral prednisone 30 mg daily (IV Protonix 40mg  daily) - Duoneb QID  - Continue supplemental O2 - pt has diuresed >3.1L since admission and clinically improving  - resume home oral daily furosemide 7/22    Hyperkalemia - resolved with aggressive treatments Presented with K at 6.5, acute on chronic, and takes Lokelma every other day at home since her last discharge in June. Has continued to be elevated despite Lasix, Lokelma, Insulin  with D50, but better on last recheck at 6.0. Repeat EKGs without T wave changes. Appreciate Nephrology recs. Increasing IV Lasix 40mg  to BID today, and increasing Lokelma to BID as well. Appears to be a chronic problem, may be Type IV  RTA. - IV furosemide - follow BMP  - DC Lokelma - fludrocortisone started by nephrology  Hypokalemia - oral replacement given - recheck in AM   Hypomagnesemia - Mg down to 1.6 - IV magnesium sulfate given   Physical deconditioning - PT eval recommending SNF rehab  - TOC and palliative consulted to discuss goals of care with family   Symptomatic Hyponatremia Presented with Na at 122, and also appears to be an acute on chronic problem 2/2 poor PO intake, which has been worse over the last two days, per son. Pt presents with son who endorses concern of increased confusion over last two days as well. Less confused today, 7/19. Goal Na by 7/19 AM was 128-130, which was met this AM with Na at 128. Urine sodium 75 with 275 urine osm, not appearing as SIADH, nor adrenal insufficiency given morning cortisol wnl. Also improved alongside lasix, perhaps volume component was present, will continue diuresis as above. Could be Type IV RTA. - slowly improving, Will continue to monitor - Consult to nephrology appreciated - sodium is improved to 136    Acute HFpEF exacerbation, and HTN 7/18 CXR with moderate perihilar lower lobe interstitial infiltrates c/f pulmonary edema. Given her recent NSTEMI last month, the CXR, and BNP of 1095, originally suspected acute HFpEF exacerbation. While pt is not hypervolemic on exam, cannot completely exclude acute exacerbation. However, she received 2x 1L LR boluses in ED overnight and appeared to handle that well. 7/18 Echo with preserved EF, but severe pulmonary hypertension. Continuing IV Lasix 30mg  today.  - Continue to monitor closely - Imdur 30 mg daily - Metoprolol succinate 25mg     Intake/Output Summary (Last 24 hours) at 05/24/2023 1256 Last data filed at 05/24/2023 0918 Gross per 24 hour  Intake 120 ml  Output 1100 ml  Net -980 ml   Filed Weights   05/20/23 0237 05/22/23 0500 05/23/23 0342  Weight: 75.3 kg 75 kg 75.4 kg    Neutropenia iso  MDS/CMML-2 Presents with ANC of 200, chronic since September 2022. Her temperature fluctuated widely in the ED reports, from 97.2 up to one time febrile range at 101.4. No current infectious symptoms. UA clear, and comparing CXR with symptoms not concerning for PNA. Reviewed 6/27 Bone Marrow biopsy report which was consistent with Chronic Myelomonocytic Leukemia-2 with hypercellular bone marrow with increased blasts at 10-15%. Appears she may not have seen her heme/onc Dr. Kirtland Bouchard since those results.  - outpatient follow up with Dr. [name]    Thrombocytopenia - chronic Presents with PLT at 101k, and has fluctuated between 52k and 105k since at least Fall 2023.  - Will continue to monitor, stop heparin, use TED hoses   Recent GI Bleed Presenting Hgb at 7.8, down from 9.2 on 6/27, and 10.0 on 6/12. Last admission 6/4-6/7 was complicated by guaiac positive stool necessitating transfusion, and GI consult. Will check FOBT and consult GI if necessary. - Continue Protonix 40mg  BID for GI protection  Acute ICU Delirium  - slowly improving as respiratory failure improves - weaning down steroids to once daily - delirium precautions - family at bedside to help re-orient    CAD Recent NSTEMI early June 2024 with admission. No symptoms presently. - Continue Pravastatin 10mg  qhs  Paroxysmal Afib Not a candidate for anticoagulation due to high bleeding risk - Continue Metoprolol succinate 25mg  daily   Hypothyroidism - Continue Levothyroxine 100 mcg daily    DVT prophylaxis: TED hose Code Status: Full    Disposition: DC when disposition choice decided by family    Consultants:  PCCM Nephrology Palliative care  Procedures:   Antimicrobials:  Ceftiaxone  Azithromycin    Subjective: Pt says she is breathing a lot better today. She really is eager to get home.   Objective: Vitals:   05/23/23 2058 05/24/23 0145 05/24/23 0708 05/24/23 0804  BP: (!) 149/74 (!) 159/86  (!) 169/94  Pulse:  76 81 83 95  Resp: 20 18 18  (!) 34  Temp: 98.5 F (36.9 C) 98.3 F (36.8 C)  98.3 F (36.8 C)  TempSrc: Oral Oral  Oral  SpO2: 100% 98% 97% 92%  Weight:      Height:        Intake/Output Summary (Last 24 hours) at 05/24/2023 1256 Last data filed at 05/24/2023 0918 Gross per 24 hour  Intake 120 ml  Output 1100 ml  Net -980 ml   Filed Weights   05/20/23 0237 05/22/23 0500 05/23/23 0342  Weight: 75.3 kg 75 kg 75.4 kg   Examination:  General exam: awake, alert, NAD.    Respiratory system: good air movement bilateral; Cardiovascular system: normal S1 & S2 heard. Irregularly irregular and tachycardic rate, trace pedal edema. Gastrointestinal system: Abdomen is nondistended, soft and nontender. No organomegaly or masses felt. Normal bowel sounds heard. Central nervous system: Alert but less confused today. No focal neurological deficits. Extremities: Symmetric 5 x 5 power. Skin: No rashes, lesions or ulcers. Psychiatry: Judgement and insight appear improved today. Mood & affect appropriate.   Data Reviewed: I have personally reviewed following labs and imaging studies  CBC: Recent Labs  Lab 05/20/23 0245 05/21/23 0404 05/22/23 0435 05/23/23 0410 05/23/23 1914 05/24/23 0414  WBC 5.5 4.4 3.0* 2.1* 3.0* 5.1  NEUTROABS 0.2*  --   --   --   --   --   HGB 7.8* 7.3* 7.3* 7.7* 7.8* 8.1*  HCT 27.6* 26.7* 26.5* 27.1* 27.9* 27.7*  MCV 103.0* 104.7* 103.9* 101.9* 103.0* 101.5*  PLT 101* 79* 75* 68* 69* 72*    Basic Metabolic Panel: Recent Labs  Lab 05/21/23 0404 05/21/23 0814 05/21/23 1641 05/22/23 0435 05/22/23 1659 05/23/23 0410 05/24/23 0414  NA 126*   < > 130* 131* 132* 133* 136  K 5.9*   < > 6.0* 5.0 4.1 3.6 3.1*  CL 96*  --  96* 96* 97* 97* 97*  CO2 24  --  28 29 28 27 30   GLUCOSE 109*  --  112* 123* 137* 131* 96  BUN 17  --  19 22 23 22 22   CREATININE 1.22*  --  1.17* 1.15* 1.14* 1.03* 0.98  CALCIUM 7.9*  --  8.5* 8.4* 8.2* 8.5* 8.8*  MG 2.1  --   --   --   --    --  1.6*   < > = values in this interval not displayed.    CBG: Recent Labs  Lab 05/20/23 2321 05/21/23 0052 05/21/23 0328 05/23/23 1639 05/23/23 2203  GLUCAP 97 97 110* 117* 119*    Recent Results (from the past 240 hour(s))  Resp panel by RT-PCR (RSV, Flu A&B, Covid) Anterior Nasal Swab     Status: None   Collection Time: 05/20/23  2:45 AM   Specimen: Anterior Nasal  Swab  Result Value Ref Range Status   SARS Coronavirus 2 by RT PCR NEGATIVE NEGATIVE Final    Comment: (NOTE) SARS-CoV-2 target nucleic acids are NOT DETECTED.  The SARS-CoV-2 RNA is generally detectable in upper respiratory specimens during the acute phase of infection. The lowest concentration of SARS-CoV-2 viral copies this assay can detect is 138 copies/mL. A negative result does not preclude SARS-Cov-2 infection and should not be used as the sole basis for treatment or other patient management decisions. A negative result may occur with  improper specimen collection/handling, submission of specimen other than nasopharyngeal swab, presence of viral mutation(s) within the areas targeted by this assay, and inadequate number of viral copies(<138 copies/mL). A negative result must be combined with clinical observations, patient history, and epidemiological information. The expected result is Negative.  Fact Sheet for Patients:  BloggerCourse.com  Fact Sheet for Healthcare Providers:  SeriousBroker.it  This test is no t yet approved or cleared by the [place] FDA and  has been authorized for detection and/or diagnosis of SARS-CoV-2 by FDA under an Emergency Use Authorization (EUA). This EUA will remain  in effect (meaning this test can be used) for the duration of the COVID-19 declaration under Section 564(b)(1) of the Act, 21 U.S.C.section 360bbb-3(b)(1), unless the authorization is terminated  or revoked sooner.       Influenza A by PCR  NEGATIVE NEGATIVE Final   Influenza B by PCR NEGATIVE NEGATIVE Final    Comment: (NOTE) The Xpert Xpress SARS-CoV-2/FLU/RSV plus assay is intended as an aid in the diagnosis of influenza from Nasopharyngeal swab specimens and should not be used as a sole basis for treatment. Nasal washings and aspirates are unacceptable for Xpert Xpress SARS-CoV-2/FLU/RSV testing.  Fact Sheet for Patients: BloggerCourse.com  Fact Sheet for Healthcare Providers: SeriousBroker.it  This test is not yet approved or cleared by the [place] FDA and has been authorized for detection and/or diagnosis of SARS-CoV-2 by FDA under an Emergency Use Authorization (EUA). This EUA will remain in effect (meaning this test can be used) for the duration of the COVID-19 declaration under Section 564(b)(1) of the Act, 21 U.S.C. section 360bbb-3(b)(1), unless the authorization is terminated or revoked.     Resp Syncytial Virus by PCR NEGATIVE NEGATIVE Final    Comment: (NOTE) Fact Sheet for Patients: BloggerCourse.com  Fact Sheet for Healthcare Providers: SeriousBroker.it  This test is not yet approved or cleared by the [place] FDA and has been authorized for detection and/or diagnosis of SARS-CoV-2 by FDA under an Emergency Use Authorization (EUA). This EUA will remain in effect (meaning this test can be used) for the duration of the COVID-19 declaration under Section 564(b)(1) of the Act, 21 U.S.C. section 360bbb-3(b)(1), unless the authorization is terminated or revoked.  Performed at [facility], [address] [place], [place] [address]   Blood Culture (routine x 2)     Status: None (Preliminary result)   Collection Time: 05/20/23  2:45 AM   Specimen: Left Antecubital; Blood  Result Value Ref Range Status   Specimen Description LEFT ANTECUBITAL  Final   Special Requests   Final    BOTTLES DRAWN  AEROBIC AND ANAEROBIC Blood Culture adequate volume   Culture   Final    NO GROWTH 4 DAYS Performed at [facility], [address] [place], [place] [address]    Report Status PENDING  Incomplete  Blood Culture (routine x 2)     Status: None (Preliminary result)   Collection Time: 05/20/23  2:50 AM   Specimen: BLOOD RIGHT HAND  Result Value Ref Range Status   Specimen Description BLOOD RIGHT HAND  Final   Special Requests   Final    BOTTLES DRAWN AEROBIC AND ANAEROBIC Blood Culture adequate volume   Culture   Final    NO GROWTH 4 DAYS Performed at [facility], [address] [place], [place] [address]    Report Status PENDING  Incomplete  MRSA Next Gen by PCR, Nasal     Status: None   Collection Time: 05/20/23 12:45 PM   Specimen: Nasal Mucosa; Nasal Swab  Result Value Ref Range Status   MRSA by PCR Next Gen NOT DETECTED NOT DETECTED Final    Comment: (NOTE) The GeneXpert MRSA Assay (FDA approved for NASAL specimens only), is one component of a comprehensive MRSA colonization surveillance program. It is not intended to diagnose MRSA infection nor to guide or monitor treatment for MRSA infections. Test performance is not FDA approved in patients less than 99 years old. Performed at [facility], [address] [place], [place] [address]      Radiology Studies: DG CHEST PORT 1 VIEW  Result Date: 05/23/2023 CLINICAL DATA:  Evaluate for pulmonary infiltrates. EXAM: PORTABLE CHEST 1 VIEW COMPARISON:  05/20/2023 FINDINGS: Heart size is partially obscured by left lower lung opacity. Aortic atherosclerotic calcifications. Bilateral pleural effusions are noted. Scratch set persistent, small bilateral pleural effusions. Chronic coarsened interstitial markings and diffuse bronchial wall thickening again noted. Asymmetric opacification within the left base is unchanged. Atelectasis in the right base is improved from the previous exam. IMPRESSION: 1. Persistent, small bilateral pleural  effusions. 2. Persistent asymmetric opacification within the left base which may reflect atelectasis or pneumonia. 3. Improved appearance of atelectasis in the right base. 4. Chronic bronchial wall thickening and coarsened interstitial markings. Electronically Signed   By: Signa Kell M.D.   On: 05/23/2023 08:11    Scheduled Meds:  budesonide (PULMICORT) nebulizer solution  0.5 mg Nebulization BID   doxycycline  100 mg Oral Q12H   fludrocortisone  0.1 mg Oral Daily   ipratropium-albuterol  3 mL Nebulization Q6H   isosorbide mononitrate  30 mg Oral Daily   levothyroxine  100 mcg Oral QAC breakfast   metoprolol succinate  25 mg Oral Daily   mirtazapine  15 mg Oral QHS   mouth rinse  15 mL Mouth Rinse 4 times per day   pantoprazole  40 mg Oral QHS   pravastatin  10 mg Oral QPM   predniSONE  30 mg Oral Q breakfast   Continuous Infusions:   LOS: 4 days   Time spent: 35 mins  Evaan Tidwell Laural Benes, MD How to contact the [facility] Attending or Consulting provider 7A - 7P or covering provider during after hours 7P -7A, for this patient?  Check the care team in [facility] and look for a) attending/consulting [facility] provider listed and b) the [facility] team listed Log into [email or web address] and use [place]'s universal password to access. If you do not have the password, please contact the hospital operator. Locate the [facility] provider you are looking for under [facility] and page to a number that you can be directly reached. If you still have difficulty reaching the provider, please page the [facility] (Director on Call) for the Hospitalists listed on amion for assistance.  05/24/2023, 12:56 PM

## 2023-05-24 NOTE — Progress Notes (Signed)
Paged to bedside for elevated troponin.   Starting around 6p (per gransdon) patient had chest pain that was on the left side and radiated around to her back. Patient reports that the pain lasted approx 30 mins. She had no nausea, vomiting, diaphoresis, or dyspnea. Patient has been on O2 in the hospitalization and intermittently on BiPAP 2/2 CAP. Previous provider ordered EKG and trop. EKG was afib without ST elevation. She has had nonspecific t wave changes consistent with EKGs from previous hospitalizations. When troponin resulted at 318, another EKG was done that again is afib without ST elevation and artifact affecting baseline. Another troponin was ordered for 2 hours later per protocol. Grandson was requesting provider at bedside.   At the time of my exam, patient is resting comfortably. HR normal with significant (known) murmur. RR normal. No complaints. I've asked for an updated set of vitals. Patient did have hypokalemia on earlier labs. CMP, mag, trop ordered. Bedside RN mentioned that patient may be more listless than her baseline, so VBG was also ordered. Lucila Maine is concerned that patient is having another NSTEMI, and that we are not doing the treatments that were done before including nitroglycerin and heparin. To add context he does report that heparin was d/c'ed 2/2 thrombocytopenia - although patient is anemic as well, which is not mentioned. Patient is on long acting nitrate, and there is no indication for extra doses of nitroglycerin at this time. Grandson and I discussed that there are different kinds of NSTEMI and that not all would be treated the same. We discussed demand ischemia as a cause of elevated troponin in the setting of respiratory pathology especially. With the patient - it was discussed that we would not be starting any extra IV medication at this time, and we would continue to trend her troponin. Patient reports understanding and agreement with this plan. We also discussed her  taking aspirin at this time. I have since been informed that grandson does not want her to take aspirin. Lucila Maine is referring to a cardiology note that states patient is not a candidate for [long term] antiplatelet or anticoagulation due to history of myelodysplastic syndrome and chronic anemia. Per that note - she is also not a candidate for cath.   Lucila Maine is requesting cardiology consult. Defer to day team.   There have been intermittent RR elevations documented in vitals. Patient is supposed to be on BiPAP at night, and has not adhered to that plan. This could be contributing to her RR. I believe grandson is likely causing patient to be anxious as well. VBG resulted and shows CO2 to be stable from previous VBG. pH 7.46 - will follow CO2 on Chem.   Will monitor clinical response.

## 2023-05-24 NOTE — Progress Notes (Signed)
Subjective:   SNa normalized to 136 No c/o this AM, eating breakfast BPs stable [place] UOP over the weekend  Objective Vital signs in last 24 hours: Vitals:   05/23/23 2058 05/24/23 0145 05/24/23 0708 05/24/23 0804  BP: (!) 149/74 (!) 159/86  (!) 169/94  Pulse: 76 81 83 95  Resp: 20 18 18  (!) 34  Temp: 98.5 F (36.9 C) 98.3 F (36.8 C)  98.3 F (36.8 C)  TempSrc: Oral Oral  Oral  SpO2: 100% 98% 97% 92%  Weight:      Height:       Weight change:   Intake/Output Summary (Last 24 hours) at 05/24/2023 1610 Last data filed at 05/24/2023 0205 Gross per 24 hour  Intake --  Output 1500 ml  Net -1500 ml    Assessment/ Plan: Pt is a 89 y.o. yo female with many medical issues including COPD and MDS who was admitted on 05/20/2023 with hyponatremia and hyperkalemia  Assessment/Plan: 1. Resolved Hyponatremia-  volume status euvolemic to heavy.  Urine osm low normal so not significant SIADH but could be some.  Also suspect tea and toast. Cont to limit free water, encourage solute intake esp protein.  Do not use salt tabs.  2. Hyperkalemia-  chronic issue-  K down to 3.1, lokelam held.  Can cont florinef for now at DC 3. Anemia-  known MDS supportive care   Will sign off for now.  Please call with any questions or concerns.  Pt does need follow up with nephrology and I will make arrangements.    Lisa [name]    Labs: Basic Metabolic Panel: Recent Labs  Lab 05/22/23 1659 05/23/23 0410 05/24/23 0414  NA 132* 133* 136  K 4.1 3.6 3.1*  CL 97* 97* 97*  CO2 28 27 30   GLUCOSE 137* 131* 96  BUN 23 22 22   CREATININE 1.14* 1.03* 0.98  CALCIUM 8.2* 8.5* 8.8*   Liver Function Tests: Recent Labs  Lab 05/20/23 0245 05/21/23 0404  AST 70* 55*  ALT 52* 39  ALKPHOS 59 47  BILITOT 0.8 0.9  PROT 8.2* 7.1  ALBUMIN 3.2* 2.6*   No results for input(s): "LIPASE", "AMYLASE" in the last 168 hours. Recent Labs  Lab 05/20/23 0246 05/23/23 0744  AMMONIA 30 24   CBC: Recent Labs   Lab 05/20/23 0245 05/21/23 0404 05/22/23 0435 05/23/23 0410 05/23/23 1914 05/24/23 0414  WBC 5.5 4.4 3.0* 2.1* 3.0* 5.1  NEUTROABS 0.2*  --   --   --   --   --   HGB 7.8* 7.3* 7.3* 7.7* 7.8* 8.1*  HCT 27.6* 26.7* 26.5* 27.1* 27.9* 27.7*  MCV 103.0* 104.7* 103.9* 101.9* 103.0* 101.5*  PLT 101* 79* 75* 68* 69* 72*   Cardiac Enzymes: No results for input(s): "CKTOTAL", "CKMB", "CKMBINDEX", "TROPONINI" in the last 168 hours. CBG: Recent Labs  Lab 05/20/23 2321 05/21/23 0052 05/21/23 0328 05/23/23 1639 05/23/23 2203  GLUCAP 97 97 110* 117* 119*    Iron Studies: No results for input(s): "IRON", "TIBC", "TRANSFERRIN", "FERRITIN" in the last 72 hours. Studies/Results: DG CHEST PORT 1 VIEW  Result Date: 05/23/2023 CLINICAL DATA:  Evaluate for pulmonary infiltrates. EXAM: PORTABLE CHEST 1 VIEW COMPARISON:  05/20/2023 FINDINGS: Heart size is partially obscured by left lower lung opacity. Aortic atherosclerotic calcifications. Bilateral pleural effusions are noted. Scratch set persistent, small bilateral pleural effusions. Chronic coarsened interstitial markings and diffuse bronchial wall thickening again noted. Asymmetric opacification within the left base is unchanged. Atelectasis in the right  base is improved from the previous exam. IMPRESSION: 1. Persistent, small bilateral pleural effusions. 2. Persistent asymmetric opacification within the left base which may reflect atelectasis or pneumonia. 3. Improved appearance of atelectasis in the right base. 4. Chronic bronchial wall thickening and coarsened interstitial markings. Electronically Signed   By: Signa Kell M.D.   On: 05/23/2023 08:11   Medications: Infusions:  magnesium sulfate bolus IVPB 4 g (05/24/23 0856)    Scheduled Medications:  budesonide (PULMICORT) nebulizer solution  0.5 mg Nebulization BID   Chlorhexidine Gluconate Cloth  6 each Topical Daily   doxycycline  100 mg Oral Q12H   fludrocortisone  0.1 mg Oral Daily    ipratropium-albuterol  3 mL Nebulization Q6H   isosorbide mononitrate  30 mg Oral Daily   levothyroxine  100 mcg Oral QAC breakfast   metoprolol succinate  25 mg Oral Daily   mirtazapine  15 mg Oral QHS   mouth rinse  15 mL Mouth Rinse 4 times per day   pantoprazole  40 mg Oral QHS   pravastatin  10 mg Oral QPM   predniSONE  30 mg Oral Q breakfast    have reviewed scheduled and prn medications.  Physical Exam: General:  awake on bipap-  repeating same phrase  Heart:RRR Lungs: poor air movement Abdomen: soft, non tender Extremities: trace edema     05/24/2023,9:09 AM  LOS: 4 days

## 2023-05-25 DIAGNOSIS — I480 Paroxysmal atrial fibrillation: Secondary | ICD-10-CM

## 2023-05-25 DIAGNOSIS — I214 Non-ST elevation (NSTEMI) myocardial infarction: Secondary | ICD-10-CM

## 2023-05-25 DIAGNOSIS — Z7189 Other specified counseling: Secondary | ICD-10-CM | POA: Diagnosis not present

## 2023-05-25 DIAGNOSIS — E871 Hypo-osmolality and hyponatremia: Secondary | ICD-10-CM | POA: Diagnosis not present

## 2023-05-25 DIAGNOSIS — I350 Nonrheumatic aortic (valve) stenosis: Secondary | ICD-10-CM

## 2023-05-25 DIAGNOSIS — I4891 Unspecified atrial fibrillation: Secondary | ICD-10-CM

## 2023-05-25 DIAGNOSIS — I5033 Acute on chronic diastolic (congestive) heart failure: Secondary | ICD-10-CM

## 2023-05-25 DIAGNOSIS — Z515 Encounter for palliative care: Secondary | ICD-10-CM | POA: Diagnosis not present

## 2023-05-25 DIAGNOSIS — I272 Pulmonary hypertension, unspecified: Secondary | ICD-10-CM

## 2023-05-25 DIAGNOSIS — R627 Adult failure to thrive: Secondary | ICD-10-CM | POA: Diagnosis not present

## 2023-05-25 DIAGNOSIS — J189 Pneumonia, unspecified organism: Secondary | ICD-10-CM | POA: Diagnosis not present

## 2023-05-25 DIAGNOSIS — D649 Anemia, unspecified: Secondary | ICD-10-CM

## 2023-05-25 LAB — CBC
HCT: 28.2 % — ABNORMAL LOW (ref 36.0–46.0)
Hemoglobin: 7.9 g/dL — ABNORMAL LOW (ref 12.0–15.0)
MCH: 28.8 pg (ref 26.0–34.0)
MCHC: 28 g/dL — ABNORMAL LOW (ref 30.0–36.0)
MCV: 102.9 fL — ABNORMAL HIGH (ref 80.0–100.0)
Platelets: 67 10*3/uL — ABNORMAL LOW (ref 150–400)
RBC: 2.74 MIL/uL — ABNORMAL LOW (ref 3.87–5.11)
RDW: 23.1 % — ABNORMAL HIGH (ref 11.5–15.5)
WBC: 3.9 10*3/uL — ABNORMAL LOW (ref 4.0–10.5)
nRBC: 40.1 % — ABNORMAL HIGH (ref 0.0–0.2)

## 2023-05-25 LAB — TROPONIN I (HIGH SENSITIVITY): Troponin I (High Sensitivity): 299 ng/L (ref <18)

## 2023-05-25 LAB — BASIC METABOLIC PANEL
Anion gap: 12 (ref 5–15)
BUN: 19 mg/dL (ref 8–23)
CO2: 25 mmol/L (ref 22–32)
Calcium: 8.7 mg/dL — ABNORMAL LOW (ref 8.9–10.3)
Chloride: 98 mmol/L (ref 98–111)
Creatinine, Ser: 1.09 mg/dL — ABNORMAL HIGH (ref 0.44–1.00)
GFR, Estimated: 49 mL/min — ABNORMAL LOW (ref >60)
Glucose, Bld: 95 mg/dL (ref 70–99)
Potassium: 3.2 mmol/L — ABNORMAL LOW (ref 3.5–5.1)
Sodium: 135 mmol/L (ref 135–145)

## 2023-05-25 LAB — MAGNESIUM: Magnesium: 2.1 mg/dL (ref 1.7–2.4)

## 2023-05-25 MED ORDER — ISOSORBIDE MONONITRATE ER 60 MG PO TB24
60.0000 mg | ORAL_TABLET | Freq: Every day | ORAL | Status: DC
  Administered 2023-05-26 – 2023-05-28 (×3): 60 mg via ORAL
  Filled 2023-05-25 (×3): qty 1

## 2023-05-25 MED ORDER — ISOSORBIDE MONONITRATE ER 30 MG PO TB24
30.0000 mg | ORAL_TABLET | Freq: Once | ORAL | Status: AC
  Administered 2023-05-25: 30 mg via ORAL
  Filled 2023-05-25: qty 1

## 2023-05-25 MED ORDER — POTASSIUM CHLORIDE CRYS ER 20 MEQ PO TBCR
40.0000 meq | EXTENDED_RELEASE_TABLET | Freq: Once | ORAL | Status: AC
  Administered 2023-05-25: 40 meq via ORAL
  Filled 2023-05-25: qty 2

## 2023-05-25 NOTE — Progress Notes (Signed)
Received call from lab about patient's troponin level 318, MD notified new order in (see order set), patient's grandson concerned that MD not coming to see patient. MD paged again, MD came assessed patient and talked to both patient and grandson about POC (see MD note). We continue to monitor.

## 2023-05-25 NOTE — TOC Progression Note (Signed)
Transition of Care [facility]) - Progression Note    Patient Details  Name: Lisa [name] MRN: [id number] Date of Birth: 1934/03/22  Transition of Care [facility]) CM/SW Contact  Elliot Gault, LCSW Phone Number: 05/25/2023, 12:54 PM  Clinical Narrative:     TOC following. Spoke with pt's grandson this AM regarding dc planning. He reports that he spoke with pt yesterday and she is in agreement for short term rehab. Lucila Maine states that they prefer [facility] if possible. Discussed with MD who ordered OT consult and CIR eval. Currently awaiting determination from CIR. TOC provided grandson with SNF bed offers as back up plan. MD anticipating possible dc tomorrow.  TOC will follow and continue to assist with dc planning.  Expected Discharge Plan: Skilled Nursing Facility Barriers to Discharge: Continued Medical Work up  Expected Discharge Plan and Services In-house Referral: Clinical Social Work   Post Acute Care Choice: Skilled Nursing Facility Living arrangements for the past 2 months: Single Family Home                                       Social Determinants of Health (SDOH) Interventions SDOH Screenings   Food Insecurity: No Food Insecurity (05/20/2023)  Housing: Low Risk  (05/20/2023)  Transportation Needs: No Transportation Needs (05/21/2023)  Utilities: Not At Risk (05/20/2023)  Depression (PHQ2-9): Low Risk  (12/12/2020)  Physical Activity: Inactive (12/12/2020)  Tobacco Use: Medium Risk (05/24/2023)    Readmission Risk Interventions    05/24/2023    2:10 PM 08/28/2021    1:06 PM  Readmission Risk Prevention Plan  Transportation Screening Complete Complete  HRI or Home Care Consult Complete Complete  Social Work Consult for Recovery Care Planning/Counseling Complete Complete  Palliative Care Screening Complete Not Applicable  Medication Review Oceanographer) Complete Complete

## 2023-05-25 NOTE — Care Management Important Message (Signed)
Important Message  Patient Details Called Patient's Daughter Thornton Dales to give verbal IM Name: Lisa [name] MRN: [id number] Date of Birth: 03/05/1934   Medicare Important Message Given:  Yes     Caren Macadam 05/25/2023, 11:45 AM

## 2023-05-25 NOTE — Evaluation (Signed)
Occupational Therapy Evaluation Patient Details Name: Lisa [name] MRN: [id number] DOB: 1934-04-18 Today's Date: 05/25/2023   History of Present Illness 89 y.o. female with medical history significant for COPD c/b chronic hypoxic respiratory failure on 2L baseline, HFpEF, CKD3b, Paroxysmal Afib, and MDS/CMML c/b chronic neutropenia and thrombocytopenia, and chronic hyperkalemia, who presented with weakness and was admitted for acute on chronic hypoxic and hypercapnic respiratory failure and COPD exacerbation.     Lisa [name] was recently admitted 6/4-6/7 for an NSTEMI, though her course was also complicated by acute blood loss anemia (discharged with PPI BID per GI) and hyperkalemia (discharged with every other day [facility]).     On 7/18, Lisa [name] presented after EMS was called by her family because she had become progressively weaker over the prior 2 days. Also endorsed decreased appetite, increased confusion, sleeping throughout the day, and ultimately found to have increased O2 requirement after family checked O2 sats in 70s after Lisa [name] collapsed to the floor.     On presentation to ED, Lisa [name] was advanced to 6L to maintain O2 sats >90%, duonebs, and NaHCO3 after ABG showed 7.23/65/46/26. Subsequent ABG worsened to 7.22/72/<31/30 and she was moved to BiPAP as she was brought into ICU. Her K was also elevated at 6.5, without EKG changes, given Calcium gluconate, Lokelma 5g and 2x 1L LR boluses. 7/18 CXR c/w moderate perihilar and lower lobe interstitial pulmonary edema. BNP elevated at 1095 with a normal lactate. Also found to have stable on chronic neutropenia with ANC 200, and stable on chronic thrombocytopenia with PLT at 101k. Temp has fluctuated 97.2 to 101.4. Her 7/18 CT Head revealed no acute intracranial process.       Over the course of afternoon and night of 7/18, consulted PCCM for BiPAP settings. Continued to manage K with Lasix and Lokelma. This morning, 7/19, K is at 6.0 and Na has  improved to 128. Per PCCM recs, will trial pt off of Bipap today, repeat CXR, and continue IV Lasix 40mg . Nephrology adding fludrocortisone and increasing Lokelma to BID for hyperkalemia.   Clinical Impression   Pt agreeable to OT evaluation. Pt reportedly able to ambulate to bathroom on her own at baseline with RW. Assisted for bathing and dressing at baseline. Assisted for IADL's. Today pt was able to doff and don socks seated in the chair. Pt is generally weak in B UE. Mod A for sit to stand with more min A for step pivot to the chair and to the bed. Able to take ~4 to 5 steps away from EOB with RW. Pt was left in the chair with family present. Pt will benefit from continued OT in the hospital and recommended venue below to increase strength, balance, and endurance for safe ADL's.         Recommendations for follow up therapy are one component of a multi-disciplinary discharge planning process, led by the attending physician.  Recommendations may be updated based on patient status, additional functional criteria and insurance authorization.   Assistance Recommended at Discharge Intermittent Supervision/Assistance  Patient can return home with the following A lot of help with walking and/or transfers;A little help with bathing/dressing/bathroom;Assistance with cooking/housework;Assist for transportation;Help with stairs or ramp for entrance    Functional Status Assessment  Patient has had a recent decline in their functional status and demonstrates the ability to make significant improvements in function in a reasonable and predictable amount of time.  Equipment Recommendations  None recommended by OT  Precautions / Restrictions Precautions Precautions: Fall Restrictions Weight Bearing Restrictions: No      Mobility Bed Mobility Overal bed mobility: Needs Assistance Bed Mobility: Supine to Sit, Sit to Supine     Supine to sit: Supervision Sit to supine: Supervision    General bed mobility comments: increased time    Transfers Overall transfer level: Needs assistance Equipment used: Rolling walker (2 wheels) Transfers: Sit to/from Stand, Bed to chair/wheelchair/BSC Sit to Stand: Mod assist     Step pivot transfers: Min assist, Mod assist     General transfer comment: use of RW during transfers; assist needed to boost from EOB. More min G to min A once standing.      Balance Overall balance assessment: Needs assistance Sitting-balance support: Feet supported, Bilateral upper extremity supported Sitting balance-Leahy Scale: Good Sitting balance - Comments: seated at EOB   Standing balance support: During functional activity, Bilateral upper extremity supported, Reliant on assistive device for balance Standing balance-Leahy Scale: Fair Standing balance comment: poor to fair with RW                           ADL either performed or assessed with clinical judgement   ADL Overall ADL's : Needs assistance/impaired Eating/Feeding: Modified independent;Sitting   Grooming: Modified independent;Sitting   Upper Body Bathing: Set up;Sitting   Lower Body Bathing: Moderate assistance;Sitting/lateral leans   Upper Body Dressing : Set up;Sitting   Lower Body Dressing: Set up;Sitting/lateral leans Lower Body Dressing Details (indicate cue type and reason): Pt able to doff and don socks seated in recliner. Toilet Transfer: Moderate assistance;Minimal assistance;Ambulation;Stand-pivot;Rolling walker (2 wheels) Toilet Transfer Details (indicate cue type and reason): Simulated via ambulation in room and step pivot to chair from the bed with RW. Toileting- Clothing Manipulation and Hygiene: Minimal assistance;Min guard;Sitting/lateral lean   Tub/ Engineer, structural: Moderate assistance;Stand-pivot   Functional mobility during ADLs: Minimal assistance;Moderate assistance;Rolling walker (2 wheels)       Vision Baseline Vision/History: 1 Wears  glasses Ability to See in Adequate Light: 1 Impaired Patient Visual Report: No change from baseline Vision Assessment?: No apparent visual deficits                Pertinent Vitals/Pain Pain Assessment Pain Assessment: No/denies pain     Hand Dominance Right   Extremity/Trunk Assessment Upper Extremity Assessment Upper Extremity Assessment: Generalized weakness   Lower Extremity Assessment Lower Extremity Assessment: Defer to PT evaluation   Cervical / Trunk Assessment Cervical / Trunk Assessment: Normal   Communication Communication Communication: HOH   Cognition Arousal/Alertness: Awake/alert Behavior During Therapy: WFL for tasks assessed/performed Overall Cognitive Status: Within Functional Limits for tasks assessed                                                        Home Living Family/patient expects to be discharged to:: Private residence Living Arrangements: Children Available Help at Discharge: Family;Available 24 hours/day Type of Home: House Home Access: Stairs to enter;Other (comment) Entrance Stairs-Number of Steps: 4   Home Layout: Two level;Able to live on main level with bedroom/bathroom Alternate Level Stairs-Number of Steps: 14 Alternate Level Stairs-Rails: Left Bathroom Shower/Tub: Tub/shower unit   Bathroom Toilet: Handicapped height Bathroom Accessibility: Yes   Home Equipment: Agricultural consultant (2 wheels);Cane - quad;BSC/3in1;Grab bars -  tub/shower;Wheelchair - manual;Other (comment) (lift chair)   Additional Comments: Pt reports living with daughter . Pt on supplemental O2 at home. (per chart review. Uses lift chair.)      Prior Functioning/Environment Prior Level of Function : Needs assist           ADLs (physical): IADLs;Dressing;Bathing Mobility Comments: household and short distanced community ambulator using quad-cane or RW ADLs Comments: Assited for bathing and dressing. Able to toilet, groom, and feed  on her own. IADL assist.        OT Problem List: Decreased strength;Decreased activity tolerance;Impaired balance (sitting and/or standing)      OT Treatment/Interventions: Self-care/ADL training;Therapeutic exercise;Therapeutic activities;Patient/family education;Balance training    OT Goals(Current goals can be found in the care plan section) Acute Rehab OT Goals Patient Stated Goal: For pt to get up; family has stated interest in CIR for rehab. OT Goal Formulation: With patient/family Time For Goal Achievement: 06/08/23 Potential to Achieve Goals: Good  OT Frequency: Min 1X/week                                   End of Session Equipment Utilized During Treatment: Rolling walker (2 wheels)  Activity Tolerance: Patient tolerated treatment well Patient left: in chair;with call bell/phone within reach;with family/visitor present  OT Visit Diagnosis: Other abnormalities of gait and mobility (R26.89);Unsteadiness on feet (R26.81);Muscle weakness (generalized) (M62.81)                Time: 2956-2130 OT Time Calculation (min): 21 min Charges:  OT General Charges $OT Visit: 1 Visit OT Evaluation $OT Eval Low Complexity: 1 Low  Buddie Marston OT, MOT   Danie Chandler 05/25/2023, 12:04 PM

## 2023-05-25 NOTE — Consult Note (Signed)
[facility] Customer service manager [facility]) Accountable Care Organization (ACO) [facility] Liaison Note  05/25/2023  Lisa [name] [birth year] [id number]  Location: [facility] RN Hospital Liaison screened the patient remotely at [facility].  Insurance:  Medicare   [name] is a 89 y.o. female who is a Optician, dispensing Care Patient of Margo Aye, Kathleene Hazel, MD. The patient was screened for readmission hospitalization with noted high risk score for unplanned readmission risk with 2 IP/1 ED in 6 months.  The patient was assessed for potential [facility]) Care Management service needs for post hospital transition for care coordination. Review of patient's electronic medical record reveals patient was admitted for Pneumonia. Currently pt pending CIR for STR. Facility will continue to manage pt's ongoing needs post hospital discharge.  Plan: [facility] Liaison will continue to follow progress and disposition to asess for post hospital community care coordination/management needs.  Referral request for community care coordination: pending disposition.   [facility] Care Management/Population Health does not replace or interfere with any arrangements made by the Inpatient Transition of Care team.   For questions contact:   Elliot Cousin, RN, [facility] Liaison [place]   Population Health Office Hours MTWF  8:00 am-6:00 pm Off on Thursday [phone] mobile [phone] [Office toll free line] Office Hours are M-F 8:30 - 5 pm 24 hour nurse advise line [phone] Concierge  [name]

## 2023-05-25 NOTE — Progress Notes (Signed)
Patient had just returned to bed from [facility] when I entered room. Vital signs were obtained. Patient was alert and had no complaints of chest pain/pressure/discomfort, shortness of breath, or back pain. She was not diaphoretic. Troponin remains elevated at 279, but is trending down from earlier. Follow up phone call made to Georgiana Shore to provide update.

## 2023-05-25 NOTE — Plan of Care (Signed)
  Problem: Acute Rehab OT Goals (only OT should resolve) Goal: Pt. Will Perform Grooming Flowsheets (Taken 05/25/2023 1207) Pt Will Perform Grooming:  with modified independence  sitting  Independently Goal: Pt. Will Perform Lower Body Bathing Flowsheets (Taken 05/25/2023 1207) Pt Will Perform Lower Body Bathing:  sitting/lateral leans  with supervision Goal: Pt. Will Perform Lower Body Dressing Flowsheets (Taken 05/25/2023 1207) Pt Will Perform Lower Body Dressing:  with modified independence  sitting/lateral leans Goal: Pt. Will Transfer To Toilet Flowsheets (Taken 05/25/2023 1207) Pt Will Transfer to Toilet:  with modified independence  ambulating Goal: Pt. Will Perform Toileting-Clothing Manipulation Flowsheets (Taken 05/25/2023 1207) Pt Will Perform Toileting - Clothing Manipulation and hygiene:  with modified independence  sitting/lateral leans Goal: Pt/Caregiver Will Perform Home Exercise Program Flowsheets (Taken 05/25/2023 1207) Pt/caregiver will Perform Home Exercise Program:  Increased strength  Both right and left upper extremity  Independently  Clarann Helvey OT, MOT

## 2023-05-25 NOTE — Progress Notes (Signed)
Physical Therapy Treatment Patient Details Name: Lisa [name] MRN: [id number] DOB: 03/08/1934 Today's Date: 05/25/2023   History of Present Illness 89 y.o. female with medical history significant for COPD c/b chronic hypoxic respiratory failure on 2L baseline, HFpEF, CKD3b, Paroxysmal Afib, and MDS/CMML c/b chronic neutropenia and thrombocytopenia, and chronic hyperkalemia, who presented with weakness and was admitted for acute on chronic hypoxic and hypercapnic respiratory failure and COPD exacerbation.     Lisa [name] was recently admitted 6/4-6/7 for an NSTEMI, though her course was also complicated by acute blood loss anemia (discharged with PPI BID per GI) and hyperkalemia (discharged with every other day [facility]).     On 7/18, Lisa [name] presented after EMS was called by her family because she had become progressively weaker over the prior 2 days. Also endorsed decreased appetite, increased confusion, sleeping throughout the day, and ultimately found to have increased O2 requirement after family checked O2 sats in 70s after Lisa [name] collapsed to the floor.     On presentation to ED, Lisa [name] was advanced to 6L to maintain O2 sats >90%, duonebs, and NaHCO3 after ABG showed 7.23/65/46/26. Subsequent ABG worsened to 7.22/72/<31/30 and she was moved to BiPAP as she was brought into ICU. Her K was also elevated at 6.5, without EKG changes, given Calcium gluconate, Lokelma 5g and 2x 1L LR boluses. 7/18 CXR c/w moderate perihilar and lower lobe interstitial pulmonary edema. BNP elevated at 1095 with a normal lactate. Also found to have stable on chronic neutropenia with ANC 200, and stable on chronic thrombocytopenia with PLT at 101k. Temp has fluctuated 97.2 to 101.4. Her 7/18 CT Head revealed no acute intracranial process.       Over the course of afternoon and night of 7/18, consulted PCCM for BiPAP settings. Continued to manage K with Lasix and Lokelma. This morning, 7/19, K is at 6.0 and Na has  improved to 128. Per PCCM recs, will trial pt off of Bipap today, repeat CXR, and continue IV Lasix 40mg . Nephrology adding fludrocortisone and increasing Lokelma to BID for hyperkalemia.    PT Comments  Pt tolerated treatment well. Ambulation activity limited due to fatigue and LE weakness. Required mod assist to transfer from bed to chair and required a RW to ambulate in and out of room. O2 monitored throughout session. Pt tolerated sitting up in chair after therapy session. Patient will benefit from continued skilled physical therapy in hospital and recommended venue below to increase strength, balance, endurance for safe ADLs and gait.      Assistance Recommended at Discharge Set up Supervision/Assistance  If plan is discharge home, recommend the following:  Can travel by private vehicle    A lot of help with walking and/or transfers;A lot of help with bathing/dressing/bathroom;Assistance with cooking/housework;Assist for transportation      Equipment Recommendations  Rolling walker (2 wheels)    Recommendations for Other Services       Precautions / Restrictions Precautions Precautions: Fall Restrictions Weight Bearing Restrictions: No     Mobility  Bed Mobility Overal bed mobility: Needs Assistance Bed Mobility: Supine to Sit     Supine to sit: Modified independent (Device/Increase time)     General bed mobility comments: increased time and labored    Transfers Overall transfer level: Needs assistance Equipment used: Rolling walker (2 wheels) Transfers: Sit to/from Stand, Bed to chair/wheelchair/BSC Sit to Stand: Mod assist   Step pivot transfers: Modified independent (Device/Increase time)       General transfer comment: use  of RW during transfers, sit to stand required mod assist from therapist    Ambulation/Gait Ambulation/Gait assistance: Min assist Gait Distance (Feet): 20 Feet Assistive device: Rolling walker (2 wheels) Gait Pattern/deviations:  Step-through pattern, Decreased step length - right, Decreased step length - left, Decreased stride length Gait velocity: slowed     General Gait Details: use of RW, slightly labored   Stairs             Wheelchair Mobility     Tilt Bed    Modified Rankin (Stroke Patients Only)       Balance Overall balance assessment: Needs assistance Sitting-balance support: Feet supported, Single extremity supported Sitting balance-Leahy Scale: Good Sitting balance - Comments: good balance seated EOB     Standing balance-Leahy Scale: Fair Standing balance comment: fair balance standing at bedside with use of RW                            Cognition Arousal/Alertness: Awake/alert Behavior During Therapy: WFL for tasks assessed/performed Overall Cognitive Status: Within Functional Limits for tasks assessed                                          Exercises General Exercises - Lower Extremity Ankle Circles/Pumps: AROM, Strengthening, Right, Left, Seated, 10 reps Long Arc Quad: AROM, Strengthening, Right, Left, 10 reps, Seated Hip Flexion/Marching: AROM, Strengthening, Right, Left, 10 reps, Seated Toe Raises: AROM, Strengthening, Right, Left, 10 reps, Seated Heel Raises: AROM, Strengthening, Right, Left, 10 reps, Seated    General Comments        Pertinent Vitals/Pain Pain Assessment Pain Assessment: No/denies pain    Home Living                          Prior Function            PT Goals (current goals can now be found in the care plan section) Acute Rehab PT Goals Patient Stated Goal: Return home with assist of family PT Goal Formulation: With patient Time For Goal Achievement: 06/07/23 Potential to Achieve Goals: Good Progress towards PT goals: Progressing toward goals    Frequency    Min 3X/week      PT Plan      Co-evaluation              AM-PAC PT "6 Clicks" Mobility   Outcome Measure  Help needed  turning from your back to your side while in a flat bed without using bedrails?: A Little Help needed moving from lying on your back to sitting on the side of a flat bed without using bedrails?: A Little Help needed moving to and from a bed to a chair (including a wheelchair)?: A Lot Help needed standing up from a chair using your arms (e.g., wheelchair or bedside chair)?: A Lot Help needed to walk in hospital room?: A Lot Help needed climbing 3-5 steps with a railing? : Total 6 Click Score: 13    End of Session   Activity Tolerance: Patient tolerated treatment well;Patient limited by fatigue Patient left: in chair;with call bell/phone within reach Nurse Communication: Mobility status PT Visit Diagnosis: Unsteadiness on feet (R26.81);Muscle weakness (generalized) (M62.81);Difficulty in walking, not elsewhere classified (R26.2)     Time: 1610-9604 PT Time Calculation (min) (ACUTE ONLY): 20 min  Charges:    $  Gait Training: 8-22 mins $Therapeutic Exercise: 8-22 mins PT General Charges $$ ACUTE PT VISIT: 1 Visit                     Britta Louth SPT [place], DPT Program

## 2023-05-25 NOTE — Progress Notes (Signed)
Palliative:   Lisa [name] is sitting up in the geri chair in her room.  She is resting comfortably, but wakes easily when I enter.  She greets me, making and mostly keeping eye contact.  She is alert and oriented, able to make her needs known.  Her daughter is present at bedside.    We talk about her need for acute rehab.  We talk about short-term rehab at [facility], up to 3 hours/day.  We talk about evaluation for suitability.  All questions answered.  No concerns at this time.  Plan: Continue to treat the treatable.  Requesting short-term rehab at [facility] if approved.  Ultimate goal is to return home.  25 minutes  [name], NP Palliative Medicine Team  Team Phone [phone] Greater than 50% of this time was spent counseling and coordinating care related to the above assessment and plan.

## 2023-05-25 NOTE — Progress Notes (Signed)
PROGRESS NOTE   Lisa [name]  ZOX:[id number] DOB: [birth year] DOA: 05/20/2023 PCP: [name], MD   Chief Complaint  Patient presents with   Altered Mental Status   Level of care: Telemetry  Brief Admission History:  89 y.o. female with medical history significant for COPD c/b chronic hypoxic respiratory failure on 2L baseline, HFpEF, CKD3b, Paroxysmal Afib, and MDS/CMML c/b chronic neutropenia and thrombocytopenia, and chronic hyperkalemia, who presented with weakness and was admitted for acute on chronic hypoxic and hypercapnic respiratory failure and COPD exacerbation.   Lisa [name] was recently admitted 6/4-6/7 for an NSTEMI, though her course was also complicated by acute blood loss anemia (discharged with PPI BID per GI) and hyperkalemia (discharged with every other day [facility]).   On 7/18, Lisa [name] presented after EMS was called by her family because she had become progressively weaker over the prior 2 days. Also endorsed decreased appetite, increased confusion, sleeping throughout the day, and ultimately found to have increased O2 requirement after family checked O2 sats in 70s after Lisa [name] collapsed to the floor.   On presentation to ED, Lisa [name] was advanced to 6L to maintain O2 sats >90%, duonebs, and NaHCO3 after ABG showed 7.23/65/46/26. Subsequent ABG worsened to 7.22/72/<31/30 and she was moved to BiPAP as she was brought into ICU. Her K was also elevated at 6.5, without EKG changes, given Calcium gluconate, Lokelma 5g and 2x 1L LR boluses. 7/18 CXR c/w moderate perihilar and lower lobe interstitial pulmonary edema. BNP elevated at 1095 with a normal lactate. Also found to have stable on chronic neutropenia with ANC 200, and stable on chronic thrombocytopenia with PLT at 101k. Temp has fluctuated 97.2 to 101.4. Her 7/18 CT Head revealed no acute intracranial process.     Over the course of afternoon and night of 7/18, consulted PCCM for BiPAP settings. Continued to manage  K with Lasix and Lokelma. This morning, 7/19, K is at 6.0 and Na has improved to 128. Per PCCM recs, will trial pt off of Bipap today, repeat CXR, and continue IV Lasix 40mg . Nephrology adding fludrocortisone and increasing Lokelma to BID for hyperkalemia.    Assessment and Plan:  Acute on chronic hypoxic and hypercapnic respiratory failure c/b COPD Exacerbation, CAP and acute diastolic heart failure Pt presented with ABG 7.23/65/46/27, with increased supplemental O2 requirement from 2L baseline to 4L in ED. 7/18 CXR with moderate perihilar lower lobe interstitial infiltrates c/f pulmonary edema. Given her recent NSTEMI last month, the CXR, and BNP of 1095, was suspicious of acute HFpEF exacerbation, but not matching clinical exam and pt handled initial 1L LR boluses in ED well. 7/19 Echo with preserved EF however did demonstrate severe pulmonary artery hypertension.  Appreciate PCCM recs, has remained off bipap; transferred to telemetry bed  - Adding incentive spirometry and flutter valve.  - Ceftriaxone and Azithromycin initiated 7/19, now completing oral doxycycline  - treated with IV furosemide with good results, now back to home oral furosemide 20 mg  - Pulmicort neb treatments 0.5 BID - IV Methylprednisolone completed, started oral prednisone 30 mg daily (IV Protonix 40mg  daily for GI protection) - Duoneb QID  - Continue supplemental O2 - pt has diuresed >4.1L since admission and clinically improving  - resumed home oral daily furosemide 20 mg  7/22    Hyperkalemia - resolved with aggressive treatments Presented with K at 6.5, acute on chronic, and takes Lokelma every other day at home since her last discharge in June. Has continued to be  elevated despite Lasix, Lokelma, Insulin with D50, but better on last recheck at 6.0. Repeat EKGs without T wave changes. Appreciate Nephrology recs. Increasing IV Lasix 40mg  to BID today, and increasing Lokelma to BID as well. Appears to be a chronic  problem, may be Type IV RTA. - IV furosemide given and now back to daily home oral furosemide - follow BMP  - DC Lokelma - fludrocortisone started by nephrology  Hypokalemia - oral replacement given - holding lokelma for now - magnesium sufficiently repleted  - recheck in AM   Hypomagnesemia - Mg down to 1.6 - IV magnesium sulfate given and repleted    Physical deconditioning - PT eval recommending SNF rehab  - TOC and palliative consulted to discuss goals of care with family - family requesting CIR - requested OT eval and CIR referral made for evaluation   Symptomatic Hyponatremia Presented with Na at 122, and also appears to be an acute on chronic problem 2/2 poor PO intake, which has been worse over the last two days, per son. Pt presents with son who endorses concern of increased confusion over last two days as well. Less confused today, 7/19. Goal Na by 7/19 AM was 128-130, which was met this AM with Na at 128. Urine sodium 75 with 275 urine osm, not appearing as SIADH, nor adrenal insufficiency given morning cortisol wnl. Also improved alongside lasix, perhaps volume component was present, will continue diuresis as above. Could be Type IV RTA. - slowly improving, Will continue to monitor - nephrology consultation appreciated - sodium is improved to 135  - nephrology arranging outpatient follow up    Acute HFpEF exacerbation, and HTN 7/18 CXR with moderate perihilar lower lobe interstitial infiltrates c/f pulmonary edema. Given her recent NSTEMI last month, the CXR, and BNP of 1095, originally suspected acute HFpEF exacerbation. While pt is not hypervolemic on exam, cannot completely exclude acute exacerbation. However, she received 2x 1L LR boluses in ED overnight and appeared to handle that well. 7/18 Echo with preserved EF, but severe pulmonary hypertension.  - Continue to monitor closely - Imdur 30 mg daily - Metoprolol succinate 25mg  - furosemide 20 mg daily      Intake/Output Summary (Last 24 hours) at 05/25/2023 1341 Last data filed at 05/25/2023 1300 Gross per 24 hour  Intake 840 ml  Output 900 ml  Net -60 ml   Filed Weights   05/20/23 0237 05/22/23 0500 05/23/23 0342  Weight: 75.3 kg 75 kg 75.4 kg    Neutropenia iso MDS/CMML-2 Presents with ANC of 200, chronic since September 2022. Her temperature fluctuated widely in the ED reports, from 97.2 up to one time febrile range at 101.4. No current infectious symptoms. UA clear, and comparing CXR with symptoms not concerning for PNA. Reviewed 6/27 Bone Marrow biopsy report which was consistent with Chronic Myelomonocytic Leukemia-2 with hypercellular bone marrow with increased blasts at 10-15%. Appears she may not have seen her heme/onc Dr. Kirtland Bouchard since those results.  - outpatient follow up with Dr. [name]    Thrombocytopenia - chronic Presents with PLT at 101k, and has fluctuated between 52k and 105k since at least Fall 2023.  - Will continue to monitor, stop heparin, use TED hoses   Recent GI Bleed Presenting Hgb at 7.8, down from 9.2 on 6/27, and 10.0 on 6/12. Last admission 6/4-6/7 was complicated by guaiac positive stool necessitating transfusion, and GI consult. Will check FOBT and consult GI if necessary. - Continue Protonix 40mg  BID for GI protection  Acute ICU Delirium - resolving  - slowly improving as respiratory failure improves - weaned down steroids to once daily - continue delirium precautions - family frequently at bedside to help re-orient patient    CAD Recent NSTEMI early June 2024 with admission. No symptoms presently. - Continue Pravastatin 10mg  at bedtime  NSTEMI - pt had brief chest pain episode 7/22, work up showed peak HS troponin 318, 297, 299 - appreciate cardiology consult and recommendation - plan is for medical management, symptom management   Paroxysmal Afib Not a candidate for anticoagulation due to high bleeding risk - Continue Metoprolol succinate 25mg   daily   Hypothyroidism - Continue Levothyroxine 100 mcg daily    DVT prophylaxis: TED hose Code Status: Full    Disposition: DC when disposition choice decided by family    Consultants:  PCCM Nephrology Palliative care  Procedures:   Antimicrobials:  Cefepime/vanc 7/18 x 1 dose Ceftiaxone 7/19-7/21 Azithromycin 7/19-7/21 Doxycycline 7/22>>   Subjective: Pt reports no further chest discomfort or shortness of breath at this time, denies palpitations.    Objective: Vitals:   05/25/23 0433 05/25/23 0709 05/25/23 1007 05/25/23 1258  BP: (!) 147/83  (!) 159/77 129/68  Pulse: 82 87 77 (!) 59  Resp: 20 20 (!) 24   Temp: 98.5 F (36.9 C)  98 F (36.7 C)   TempSrc:   Oral   SpO2: 98% 93% 99% 90%  Weight:      Height:        Intake/Output Summary (Last 24 hours) at 05/25/2023 1341 Last data filed at 05/25/2023 1300 Gross per 24 hour  Intake 840 ml  Output 900 ml  Net -60 ml   Filed Weights   05/20/23 0237 05/22/23 0500 05/23/23 0342  Weight: 75.3 kg 75 kg 75.4 kg   Examination:  General exam: awake, alert, NAD. Pt cooperative for exam.    Respiratory system: good air movement bilateral; no wheeze heard today.  Cardiovascular system: normal S1 & S2 heard. Irregularly irregular, trace pedal edema. Gastrointestinal system: Abdomen is nondistended, soft and nontender. No organomegaly or masses felt. Normal bowel sounds heard. Central nervous system: Alert oriented x2,  No focal neurological deficits. Extremities: Symmetric 5 x 5 power. Skin: No rashes, lesions or ulcers. Psychiatry: Judgement and insight appear much better. Mood & affect appropriate.   Data Reviewed: I have personally reviewed following labs and imaging studies  CBC: Recent Labs  Lab 05/20/23 0245 05/21/23 0404 05/22/23 0435 05/23/23 0410 05/23/23 1914 05/24/23 0414 05/25/23 0441  WBC 5.5   < > 3.0* 2.1* 3.0* 5.1 3.9*  NEUTROABS 0.2*  --   --   --   --   --   --   HGB 7.8*   < > 7.3* 7.7*  7.8* 8.1* 7.9*  HCT 27.6*   < > 26.5* 27.1* 27.9* 27.7* 28.2*  MCV 103.0*   < > 103.9* 101.9* 103.0* 101.5* 102.9*  PLT 101*   < > 75* 68* 69* 72* 67*   < > = values in this interval not displayed.    Basic Metabolic Panel: Recent Labs  Lab 05/21/23 0404 05/21/23 0814 05/22/23 1659 05/23/23 0410 05/24/23 0414 05/24/23 2144 05/25/23 0441  NA 126*   < > 132* 133* 136 134* 135  K 5.9*   < > 4.1 3.6 3.1* 3.6 3.2*  CL 96*   < > 97* 97* 97* 97* 98  CO2 24   < > 28 27 30 26 25   GLUCOSE 109*   < >  137* 131* 96 136* 95  BUN 17   < > 23 22 22 19 19   CREATININE 1.22*   < > 1.14* 1.03* 0.98 1.08* 1.09*  CALCIUM 7.9*   < > 8.2* 8.5* 8.8* 8.7* 8.7*  MG 2.1  --   --   --  1.6* 2.2 2.1   < > = values in this interval not displayed.    CBG: Recent Labs  Lab 05/20/23 2321 05/21/23 0052 05/21/23 0328 05/23/23 1639 05/23/23 2203  GLUCAP 97 97 110* 117* 119*    Recent Results (from the past 240 hour(s))  Resp panel by RT-PCR (RSV, Flu A&B, Covid) Anterior Nasal Swab     Status: None   Collection Time: 05/20/23  2:45 AM   Specimen: Anterior Nasal Swab  Result Value Ref Range Status   SARS Coronavirus 2 by RT PCR NEGATIVE NEGATIVE Final    Comment: (NOTE) SARS-CoV-2 target nucleic acids are NOT DETECTED.  The SARS-CoV-2 RNA is generally detectable in upper respiratory specimens during the acute phase of infection. The lowest concentration of SARS-CoV-2 viral copies this assay can detect is 138 copies/mL. A negative result does not preclude SARS-Cov-2 infection and should not be used as the sole basis for treatment or other patient management decisions. A negative result may occur with  improper specimen collection/handling, submission of specimen other than nasopharyngeal swab, presence of viral mutation(s) within the areas targeted by this assay, and inadequate number of viral copies(<138 copies/mL). A negative result must be combined with clinical observations, patient history,  and epidemiological information. The expected result is Negative.  Fact Sheet for Patients:  BloggerCourse.com  Fact Sheet for Healthcare Providers:  SeriousBroker.it  This test is no t yet approved or cleared by the [place] FDA and  has been authorized for detection and/or diagnosis of SARS-CoV-2 by FDA under an Emergency Use Authorization (EUA). This EUA will remain  in effect (meaning this test can be used) for the duration of the COVID-19 declaration under Section 564(b)(1) of the Act, 21 U.S.C.section 360bbb-3(b)(1), unless the authorization is terminated  or revoked sooner.       Influenza A by PCR NEGATIVE NEGATIVE Final   Influenza B by PCR NEGATIVE NEGATIVE Final    Comment: (NOTE) The Xpert Xpress SARS-CoV-2/FLU/RSV plus assay is intended as an aid in the diagnosis of influenza from Nasopharyngeal swab specimens and should not be used as a sole basis for treatment. Nasal washings and aspirates are unacceptable for Xpert Xpress SARS-CoV-2/FLU/RSV testing.  Fact Sheet for Patients: BloggerCourse.com  Fact Sheet for Healthcare Providers: SeriousBroker.it  This test is not yet approved or cleared by the [place] FDA and has been authorized for detection and/or diagnosis of SARS-CoV-2 by FDA under an Emergency Use Authorization (EUA). This EUA will remain in effect (meaning this test can be used) for the duration of the COVID-19 declaration under Section 564(b)(1) of the Act, 21 U.S.C. section 360bbb-3(b)(1), unless the authorization is terminated or revoked.     Resp Syncytial Virus by PCR NEGATIVE NEGATIVE Final    Comment: (NOTE) Fact Sheet for Patients: BloggerCourse.com  Fact Sheet for Healthcare Providers: SeriousBroker.it  This test is not yet approved or cleared by the [place] FDA and has  been authorized for detection and/or diagnosis of SARS-CoV-2 by FDA under an Emergency Use Authorization (EUA). This EUA will remain in effect (meaning this test can be used) for the duration of the COVID-19 declaration under Section 564(b)(1) of the Act, 21 U.S.C. section  360bbb-3(b)(1), unless the authorization is terminated or revoked.  Performed at [facility], [address] [place], [place] [address]   Blood Culture (routine x 2)     Status: None   Collection Time: 05/20/23  2:45 AM   Specimen: Left Antecubital; Blood  Result Value Ref Range Status   Specimen Description LEFT ANTECUBITAL  Final   Special Requests   Final    BOTTLES DRAWN AEROBIC AND ANAEROBIC Blood Culture adequate volume   Culture   Final    NO GROWTH 5 DAYS Performed at [facility], [address] [place], [place] [address]    Report Status 05/25/2023 FINAL  Final  Blood Culture (routine x 2)     Status: None   Collection Time: 05/20/23  2:50 AM   Specimen: BLOOD RIGHT HAND  Result Value Ref Range Status   Specimen Description BLOOD RIGHT HAND  Final   Special Requests   Final    BOTTLES DRAWN AEROBIC AND ANAEROBIC Blood Culture adequate volume   Culture   Final    NO GROWTH 5 DAYS Performed at [facility], [address], [place], [place] [address]    Report Status 05/25/2023 FINAL  Final  MRSA Next Gen by PCR, Nasal     Status: None   Collection Time: 05/20/23 12:45 PM   Specimen: Nasal Mucosa; Nasal Swab  Result Value Ref Range Status   MRSA by PCR Next Gen NOT DETECTED NOT DETECTED Final    Comment: (NOTE) The GeneXpert MRSA Assay (FDA approved for NASAL specimens only), is one component of a comprehensive MRSA colonization surveillance program. It is not intended to diagnose MRSA infection nor to guide or monitor treatment for MRSA infections. Test performance is not FDA approved in patients less than 66 years old. Performed at [facility], [address], [place], [place] [address]       Radiology Studies: No results found.  Scheduled Meds:  aspirin EC  325 mg Oral Once   budesonide (PULMICORT) nebulizer solution  0.5 mg Nebulization BID   doxycycline  100 mg Oral Q12H   fludrocortisone  0.1 mg Oral Daily   furosemide  20 mg Oral Daily   isosorbide mononitrate  60 mg Oral Daily   levothyroxine  100 mcg Oral QAC breakfast   metoprolol succinate  25 mg Oral Daily   mirtazapine  15 mg Oral QHS   mouth rinse  15 mL Mouth Rinse 4 times per day   pantoprazole  40 mg Oral QHS   pravastatin  10 mg Oral QPM   predniSONE  30 mg Oral Q breakfast   Continuous Infusions:   LOS: 5 days   Time spent: 35 mins  Reynaldo Rossman Laural Benes, MD How to contact the [facility] Attending or Consulting provider 7A - 7P or covering provider during after hours 7P -7A, for this patient?  Check the care team in [facility] and look for a) attending/consulting [facility] provider listed and b) the [facility] team listed Log into [email or web address] and use [facility] universal password to access. If you do not have the password, please contact the hospital operator. Locate the [facility] provider you are looking for under [facility] and page to a number that you can be directly reached. If you still have difficulty reaching the provider, please page the [facility] (Director on Call) for the Hospitalists listed on amion for assistance.  05/25/2023, 1:41 PM

## 2023-05-25 NOTE — Consult Note (Addendum)
Cardiology Consultation   Patient ID: Lisa [name] MRN: [id number]; DOB: 1934-09-13  Admit date: 05/20/2023 Date of Consult: 05/25/2023  PCP:  [name], MD   [facility] Providers Cardiologist:  Marjo Bicker, MD        Patient Profile:   Lisa [name] is a 89 y.o. female with a hx of recent NSTEMI treated medically who is being seen 05/25/2023 for the evaluation of chest pain at the request of Dr. Laural Benes.  History of Present Illness:   Lisa [name]  with history of paroxysmal A-fib, moderate to severe aortic valve stenosis and moderate mitral valve regurgitation by echo in 1/24, myelodysplastic syndrome (anemia and thrombocytopenia) COPD on home oxygen 2L, breast cancer s/p lumpectomy and radiation, history of lung cancer s/p LUL lobectomy.  She was just discharged 04/09/23 after admission for NSTEMI peak trop 325.   Echocardiogram 6/24 showed normal LVEF, grade 2 DD, moderate to severe aortic valve stenosis (mean AV gradient went up from 22 mmHg to 31 mmHg). Due to history of myelo-dysplastic syndrome with chronic anemia and thrombocytopenia, patient is not a candidate for any invasive ischemia evaluation. Not a candidate for antiplatelet or anticoagulation either. Recommend symptomatic management with isosorbide mononitrate 30 mg once daily.  Keep hemoglobin more than 8.  Patient readmitted with COPD exacerbation and diastolic CHF, K 6.5 on lokelma every other day at home. Last night she developed chest pain radiating to her back. EKG unchanged, trop 318, 297, 299. grandson is a psychiatry resident at [facility] and requesting we see patient. Nursing staff indicated she was only having back pain when they arrived and eased with tylenol. Patient very hard of hearing and difficult historian. No family at bedside currently. She can't tell me if she's had chest pain at home in the past month. Lives with her son and daughter and has been ill for 3 yrs.  No chest pain at this time  and breathing stable. K3.2, Crt 1.09, Hgb 7.9, Plts 67.  Past Medical History:  Diagnosis Date   Adenocarcinoma of left lung (HCC) 2006   Arthritis    Asthma    Cancer of breast, female (HCC)    Cancer of lung (HCC)    Dysrhythmia    Hypertension    Hypothyroidism    Invasive ductal carcinoma of left breast (HCC) 1999   Myocardial infarction (HCC)    Neutropenia (HCC) 06/09/2016   Personal history of radiation therapy     Past Surgical History:  Procedure Laterality Date   BIOPSY  10/23/2020   Procedure: BIOPSY;  Surgeon: Benancio Deeds, MD;  Location: [facility] ENDOSCOPY;  Service: Gastroenterology;;   BREAST BIOPSY     left axillary node dissection   BREAST LUMPECTOMY Left    COLONOSCOPY  03/08/2003   [id number] rectal polyps destroyed with the tip of the snare/Polyps at hepatic flexure, splenic flexure at 35 cm/Left-sided diverticula: unable to retrieve path   COLONOSCOPY  09/12/2008   [id number] rectum and distal sigmoid diminutive polyps/scattered left sided diverticulum. hyperplastic   COLONOSCOPY N/A 03/06/2013   [id number] polyp-removed as described above; colonic diverticulosis. hyperplastic polyps. next TCS 03/2018   ESOPHAGOGASTRODUODENOSCOPY (EGD) WITH PROPOFOL N/A 10/23/2020   Procedure: ESOPHAGOGASTRODUODENOSCOPY (EGD) WITH PROPOFOL;  Surgeon: Benancio Deeds, MD;  Location: [facility] ENDOSCOPY;  Service: Gastroenterology;  Laterality: N/A;   FOOT SURGERY     LUNG REMOVAL, PARTIAL     upper lobe     Home Medications:  Prior to Admission medications   Medication  Sig Start Date End Date Taking? Authorizing Provider  acetaminophen (TYLENOL) 325 MG tablet Take 2 tablets (650 mg total) by mouth every 6 (six) hours. 11/11/20  Yes Angiulli, Mcarthur Rossetti, PA-C  albuterol (VENTOLIN HFA) 108 (90 Base) MCG/ACT inhaler Inhale 1-2 puffs into the lungs every 4 (four) hours as needed for wheezing or shortness of breath. 05/25/22  Yes [provider]   Fluticasone-Umeclidin-Vilant (TRELEGY ELLIPTA) 100-62.5-25 MCG/ACT AEPB Inhale 1 puff into the lungs daily. 12/18/22  Yes Oretha Milch, MD  furosemide (LASIX) 20 MG tablet Take 1 tablet (20 mg total) by mouth daily. 04/09/23  Yes Johnson, Clanford L, MD  gabapentin (NEURONTIN) 100 MG capsule Take 1 capsule (100 mg total) by mouth every 12 (twelve) hours as needed (neuropathy). Patient taking differently: Take 100 mg by mouth 3 (three) times daily as needed (neuropathy). 02/11/22 05/20/23 Yes Vassie Loll, MD  isosorbide mononitrate (IMDUR) 30 MG 24 hr tablet Take 1 tablet (30 mg total) by mouth daily. 04/10/23  Yes Johnson, Clanford L, MD  levothyroxine (SYNTHROID) 100 MCG tablet Take 1 tablet (100 mcg total) by mouth daily before breakfast. 08/28/21  Yes Johnson, Clanford L, MD  LOKELMA 10 g PACK packet Take 10 g by mouth every other day. 04/12/23  Yes Johnson, Clanford L, MD  magnesium oxide (MAG-OX) 400 MG tablet Take 400 mg by mouth 2 (two) times daily.   Yes [provider]  metoprolol succinate (TOPROL-XL) 25 MG 24 hr tablet TAKE 1 TABLET DAILY 02/18/23  Yes Mallipeddi, Vishnu P, MD  mirtazapine (REMERON) 7.5 MG tablet Take 1 tablet (7.5 mg total) by mouth at bedtime. Patient taking differently: Take 15 mg by mouth at bedtime. 05/28/21  Yes Artis Delay, MD  Multiple Vitamin (MULTI-VITAMIN) tablet Take 1 tablet by mouth daily.   Yes [provider]  pantoprazole (PROTONIX) 40 MG tablet Take 1 tablet (40 mg total) by mouth 2 (two) times daily. 04/09/23  Yes Johnson, Clanford L, MD  pravastatin (PRAVACHOL) 10 MG tablet Take 1 tablet (10 mg total) by mouth every evening. 04/09/23  Yes Cleora Fleet, MD    Inpatient Medications: Scheduled Meds:  aspirin EC  325 mg Oral Once   budesonide (PULMICORT) nebulizer solution  0.5 mg Nebulization BID   doxycycline  100 mg Oral Q12H   fludrocortisone  0.1 mg Oral Daily   furosemide  20 mg Oral Daily   isosorbide mononitrate  60 mg Oral  Daily   levothyroxine  100 mcg Oral QAC breakfast   metoprolol succinate  25 mg Oral Daily   mirtazapine  15 mg Oral QHS   mouth rinse  15 mL Mouth Rinse 4 times per day   pantoprazole  40 mg Oral QHS   pravastatin  10 mg Oral QPM   predniSONE  30 mg Oral Q breakfast   Continuous Infusions:  PRN Meds: acetaminophen **OR** acetaminophen, bisacodyl, ipratropium-albuterol, ondansetron **OR** ondansetron (ZOFRAN) IV, mouth rinse, oxyCODONE  Allergies:    Allergies  Allergen Reactions   Meloxicam Other (See Comments)    Caused an injury to the kidneys, per nephrologist   Micardis Hct [Telmisartan-Hctz] Other (See Comments)    Caused an injury to the kidneys, per nephrologist    Social History:   Social History   Socioeconomic History   Marital status: Widowed    Spouse name: Not on file   Number of children: Not on file   Years of education: Not on file   Highest education level: Not on file  Occupational History   Not on file  Tobacco Use   Smoking status: Former    Current packs/day: 0.00    Average packs/day: 0.8 packs/day for 35.0 years (26.3 ttl pk-yrs)    Types: Cigarettes    Start date: 24    Quit date: 8    Years since quitting: 31.5   Smokeless tobacco: Never   Tobacco comments:    smoke-free X 30 yeras  Vaping Use   Vaping status: Never Used  Substance and Sexual Activity   Alcohol use: No   Drug use: No   Sexual activity: Not Currently  Other Topics Concern   Not on file  Social History Narrative   Not on file   Social Determinants of Health   Financial Resource Strain: Not on file  Food Insecurity: No Food Insecurity (05/20/2023)   Hunger Vital Sign    Worried About Running Out of Food in the Last Year: Never true    Ran Out of Food in the Last Year: Never true  Transportation Needs: No Transportation Needs (05/21/2023)   PRAPARE - Administrator, Civil Service (Medical): No    Lack of Transportation (Non-Medical): No  Physical  Activity: Inactive (12/12/2020)   Exercise Vital Sign    Days of Exercise per Week: 0 days    Minutes of Exercise per Session: 0 min  Stress: Not on file  Social Connections: Not on file  Intimate Partner Violence: Not At Risk (05/20/2023)   Humiliation, Afraid, Rape, and Kick questionnaire    Fear of Current or Ex-Partner: No    Emotionally Abused: No    Physically Abused: No    Sexually Abused: No    Family History:     Family History  Problem Relation Age of Onset   Cancer Mother    Cancer Sister    Colon cancer Neg Hx      ROS:  Please see the history of present illness.  Review of Systems  Reason unable to perform ROS: patient poor historian.    All other ROS reviewed and negative.     Physical Exam/Data:   Vitals:   05/24/23 2120 05/25/23 0027 05/25/23 0433 05/25/23 0709  BP: (!) 151/79 (!) 149/78 (!) 147/83   Pulse: 98 79 82 87  Resp: (!) 24 20 20 20   Temp: 98.1 F (36.7 C) 98.3 F (36.8 C) 98.5 F (36.9 C)   TempSrc: Oral Oral    SpO2: 96% 92% 98% 93%  Weight:      Height:        Intake/Output Summary (Last 24 hours) at 05/25/2023 0828 Last data filed at 05/25/2023 0500 Gross per 24 hour  Intake 600 ml  Output 900 ml  Net -300 ml      05/23/2023    3:42 AM 05/22/2023    5:00 AM 05/20/2023    2:37 AM  Last 3 Weights  Weight (lbs) 166 lb 3.6 oz 165 lb 5.5 oz 166 lb  Weight (kg) 75.4 kg 75 kg 75.297 kg     Body mass index is 29.45 kg/m.  General:  Well nourished, well developed, in no acute distress  HEENT: normal Neck: no JVD Vascular: No carotid bruits; Distal pulses 2+ bilaterally Cardiac:  normal S1, S2; RRR; 3/6 systolic murmur LSB Lungs:  scattered wheezing and rhonchi  Abd: soft, nontender, no hepatomegaly  Ext: no edema Musculoskeletal:  No deformities, BUE and BLE strength normal and equal Skin: warm and dry  Neuro:  CNs  2-12 intact, no focal abnormalities noted Psych:  Normal affect   EKG:  The EKG was personally reviewed and  demonstrates:  Afib with nonspecific ST changes, no acute change Telemetry:  Telemetry was personally reviewed and demonstrates:  Afib with NSVT  Relevant CV Studies: Limited echo 05/20/23  IMPRESSIONS     1. Limited Echo for LVEF assessment.   2. Left ventricular ejection fraction, by estimation, is 60 to 65%. The  left ventricle has normal function. The left ventricle has no regional  wall motion abnormalities. There is moderate left ventricular hypertrophy.  Left ventricular diastolic function   could not be evaluated.   3. Right ventricular systolic function is normal. The right ventricular  size is not well visualized. There is severely elevated pulmonary artery  systolic pressure.   4. The mitral valve is normal in structure. Mild mitral valve  regurgitation.   5. The tricuspid valve is abnormal. Tricuspid valve regurgitation is  moderate to severe.   6. The aortic valve was not assessed. Aortic valve regurgitation is not  visualized. Moderate aortic valve stenosis. Aortic valve Vmax measures  3.00 m/s.   7. The inferior vena cava is dilated in size with <50% respiratory  variability, suggesting right atrial pressure of 15 mmHg.   Comparison(s): Changes from prior study are noted. Functional moderate to  severe TR is new.  Echo 04/07/23  IMPRESSIONS     1. Left ventricular ejection fraction, by estimation, is 60 to 65%. The  left ventricle has normal function. The left ventricle has no regional  wall motion abnormalities. There is mild left ventricular hypertrophy.  Left ventricular diastolic parameters  are consistent with Grade II diastolic dysfunction (pseudonormalization).   2. Right ventricular systolic function is mildly reduced. The right  ventricular size is mildly enlarged. There is severely elevated pulmonary  artery systolic pressure. The estimated right ventricular systolic  pressure is 90.3 mmHg.   3. Left atrial size was severely dilated.   4. Right  atrial size was severely dilated.   5. The mitral valve is normal in structure. Mild mitral valve  regurgitation. No evidence of mitral stenosis.   6. The aortic valve is calcified. There is severe calcifcation of the  aortic valve. Aortic valve regurgitation is not visualized. Moderate to  severe aortic valve stenosis. Aortic valve mean gradient measures 31.0  mmHg. Aortic valve Vmax measures 3.54  m/s. DVI is 0.26.   7. The inferior vena cava is dilated in size with <50% respiratory  variability, suggesting right atrial pressure of 15 mmHg.   Comparison(s): Changes from prior study are noted. Moderate to severe  aortic valve stenosis, Mean AV gradient increased from 22 mm Hg in 11/2022  to 31 mm Hg now.   Laboratory Data:  High Sensitivity Troponin:   Recent Labs  Lab 05/20/23 0245 05/24/23 1917 05/24/23 2144 05/25/23 0441  TROPONINIHS 19* 318* 297* 299*     Chemistry Recent Labs  Lab 05/24/23 0414 05/24/23 2144 05/25/23 0441  NA 136 134* 135  K 3.1* 3.6 3.2*  CL 97* 97* 98  CO2 30 26 25   GLUCOSE 96 136* 95  BUN 22 19 19   CREATININE 0.98 1.08* 1.09*  CALCIUM 8.8* 8.7* 8.7*  MG 1.6* 2.2 2.1  GFRNONAA 56* 50* 49*  ANIONGAP 9 11 12     Recent Labs  Lab 05/20/23 0245 05/21/23 0404 05/24/23 2144  PROT 8.2* 7.1 8.0  ALBUMIN 3.2* 2.6* 3.1*  AST 70* 55* 36  ALT 52* 39  24  ALKPHOS 59 47 44  BILITOT 0.8 0.9 0.7   Lipids No results for input(s): "CHOL", "TRIG", "HDL", "LABVLDL", "LDLCALC", "CHOLHDL" in the last 168 hours.  Hematology Recent Labs  Lab 05/23/23 1914 05/24/23 0414 05/25/23 0441  WBC 3.0* 5.1 3.9*  RBC 2.71* 2.73* 2.74*  HGB 7.8* 8.1* 7.9*  HCT 27.9* 27.7* 28.2*  MCV 103.0* 101.5* 102.9*  MCH 28.8 29.7 28.8  MCHC 28.0* 29.2* 28.0*  RDW 23.7* 23.6* 23.1*  PLT 69* 72* 67*   Thyroid  Recent Labs  Lab 05/20/23 1002  TSH 4.412    BNP Recent Labs  Lab 05/20/23 0247  BNP 1,095.0*    DDimer No results for input(s): "DDIMER" in the last  168 hours.   Radiology/Studies:  DG CHEST PORT 1 VIEW  Result Date: 05/23/2023 CLINICAL DATA:  Evaluate for pulmonary infiltrates. EXAM: PORTABLE CHEST 1 VIEW COMPARISON:  05/20/2023 FINDINGS: Heart size is partially obscured by left lower lung opacity. Aortic atherosclerotic calcifications. Bilateral pleural effusions are noted. Scratch set persistent, small bilateral pleural effusions. Chronic coarsened interstitial markings and diffuse bronchial wall thickening again noted. Asymmetric opacification within the left base is unchanged. Atelectasis in the right base is improved from the previous exam. IMPRESSION: 1. Persistent, small bilateral pleural effusions. 2. Persistent asymmetric opacification within the left base which may reflect atelectasis or pneumonia. 3. Improved appearance of atelectasis in the right base. 4. Chronic bronchial wall thickening and coarsened interstitial markings. Electronically Signed   By: Signa Kell M.D.   On: 05/23/2023 08:11   DG Chest Port 1 View  Result Date: 05/21/2023 CLINICAL DATA:  COPD exacerbation. EXAM: PORTABLE CHEST 1 VIEW COMPARISON:  05/20/2023 FINDINGS: Heart margins are partially obscured by opacity at the LEFT lung base, similar in appearance to prior study. Patchy infiltrates are seen throughout the lungs bilaterally and appear similar. Bilateral pleural effusions are present, LEFT greater than RIGHT. IMPRESSION: Bilateral pulmonary infiltrates and pleural effusions. Electronically Signed   By: Norva Pavlov M.D.   On: 05/21/2023 14:27     Assessment and Plan:   NSTEMI with peak trop 318 in setting of diastolic CHF and COPD exacerbation. Limited echo 05/20/23 without WMA. Not a candidate for ischemic eval or antiplatelet or anticoag due to myelodysplastic syndrome, chronic anemia and thrombocytopenia. Patient currently comfortable. Current meds lasix 20 mg daily, imdur 30 mg daily, toprol xl 25 mg daily, pravachol 10 mg daily. BP will tolerate  med titration. Will increase Imdur 60 mg daily. Palliative care on board.   Acute on chronic diastolic CHF I/O's negative 4.9L. overall appears compensated. Would continue low dose lasix 20 mg daily.   Moderate to severe AS, mild to mod MR-medical therapy  Hyperkalemia on lokelma every other day at home. K 3.2 today. Renal following  Fusiform infrarenal AAA 4.8 cm 11/2022, not surgical candidate  Myelodysplastic syndrome with chronic anemia and thrombocytopenia. Hgb 7.9, Plts 67   Risk Assessment/Risk Scores:     TIMI Risk Score for Unstable Angina or Non-ST Elevation MI:   The patient's TIMI risk score is 6, which indicates a 41% risk of all cause mortality, new or recurrent myocardial infarction or need for urgent revascularization in the next 14 days.  New York Heart Association (NYHA) Functional Class NYHA Class IV  CHA2DS2-VASc Score = 6   This indicates a 9.7% annual risk of stroke. The patient's score is based upon: CHF History: 1 HTN History: 1 Diabetes History: 0 Stroke History: 0 Vascular Disease History: 1 Age  Score: 2 Gender Score: 1         For questions or updates, please contact [facility] Please consult [email or web address] for contact info under    Signed, Jacolyn Reedy, PA-C  05/25/2023 8:28 AM  Attending note Patient seen and discussed with PA Geni Bers, I agree with her documentation. 89 yo female history of PAF, aortic stenosis, mod MR, myelodysplastic syndrome with chronic anemia and thrombocytopenia, COPD on home O2 2 L, lung CA s/p LUL lobectomy, chronic HFpEF,  Admit 04/2023 with NSTEMI peak trop 325, managed medically due to myelopdysplastic syndrome and inability to be on antiplatelet therapy. Admitted 05/20/23 with weakness and SOB, managed for COPD exacerbation.    WBC 5.1 Hgb 8.1 Plt 72 K 3.1 Cr 0.98 BUN 22 Mg 1.6  Trop 318-->297-->299 EKG afib, no acute ischemic changes  04/2023 echo: LVE 60-65%, no WMAs, grade II dd, mild RV dysfunction,  severe pulm HTN, severe BAE,  05/2023 echo: LVE 60-65%, no WMAs   1.Chest pain - 04/2023 admit with NSTEMI, peak trop to 325. Managed medically given not a canidate for antiplatelet therapy given myelodysplastic syndrome with chronic anemia and thrombocytopenia. She also last month had guiac positive stools, transfused.   - chest pain this admit, trop 318 trending down. EKG without acute ischemic changes - would continue to avoid anticoagulation, antiplatelet agents given bleeding risks.  - can continue to treat symptoms with long acting nitrates, imdur increased to 60mg  daily.  - continue to main lung issues that create increased demand.   - continue imdur, toprol, pravastatin.    2. Aortic stenosis - 04/2023 LVEF 60-65%, grade II dd, AVA VTI 0.61  mean grad 31, DI 0.27. SVI 27 - she has paradoxical low flow low gradient AS with favoring toward the severe end of the spectrum. Moprhologically valve looks to also favor severe end of the specturm - she is not a candidate for valve intervention given myelodysplastic syndrome with chronic anemia and thrombocytopenia, advanced age, poor functional capacity.   3. Anemia - history of myelodysplastic syndrome - also with history of GI bleed. 04/2023 admit guiac + stools, transfused.    4. Severe pulmonary HTN - 04/2023 echo PASP 90, RV mildly enlarged with mild redeuced function - signs of left sided dysfunction with grade II dd (on echo review looks more grade III)and severe BAE. History of COPD O2 dependent, prior history of lung CA with lobectomy.  - would appear to be group II and III pulmonary HTN. Would treat underlying cardiac and lung conditions that are contributing, would not pursue advanced testing given comorbidities and known etiologies that would not benefit from pulmonary vasodilators.    5. Acute on chronic HFpEF - 04/2023 echo: LVE 60-65%, no WMAs, grade II dd, mild RV dysfunction, severe pulm HTN, severe BAE. Looking at images I  think more consistent with grade III dd - negative 4.9 L this admit, currnetly on lasix 20mg  daily oral. Of note she is on florinef for chronic hyperkalemia started by neprhology.    6. COPD exacerbation - per primary team  7. Afib  Not on anticoag as outlined above - on toprol for rate control  No additional cardiology recs at this time. Overall poor prognosis given multiple advanced comorbidities. From cardiac standpoint moderate to severe AS much more favoring severe end of spectrum and severe pulmonary HTN not a canidate for additional testing of procedures as outlined above. We will sign off inpatient care.   J Shineka Auble MD

## 2023-05-26 DIAGNOSIS — J189 Pneumonia, unspecified organism: Secondary | ICD-10-CM | POA: Diagnosis not present

## 2023-05-26 MED ORDER — POTASSIUM CHLORIDE CRYS ER 20 MEQ PO TBCR
40.0000 meq | EXTENDED_RELEASE_TABLET | Freq: Two times a day (BID) | ORAL | Status: AC
  Administered 2023-05-26 (×2): 40 meq via ORAL
  Filled 2023-05-26 (×2): qty 2

## 2023-05-26 NOTE — Progress Notes (Signed)
   05/26/23 0809  Vitals  BP (!) 174/90  Pulse Rate 90  MEWS COLOR  MEWS Score Color Green  MEWS Score  MEWS Temp 0  MEWS Systolic 0  MEWS Pulse 0  MEWS RR 0  MEWS LOC 0  MEWS Score 0   MD Sherryll Burger notified. Scheduled Metoprolol given.

## 2023-05-26 NOTE — Progress Notes (Signed)
PROGRESS NOTE    Lisa [name]  WNU:[id number] DOB: May 26, 1934 DOA: 05/20/2023 PCP: [name], MD   Brief Narrative:  89 y.o. female with medical history significant for COPD c/b chronic hypoxic respiratory failure on 2L baseline, HFpEF, CKD3b, Paroxysmal Afib, and MDS/CMML c/b chronic neutropenia and thrombocytopenia, and chronic hyperkalemia, who presented with weakness and was admitted for acute on chronic hypoxic and hypercapnic respiratory failure and COPD exacerbation.   Ms Lisa [name] was recently admitted 6/4-6/7 for an NSTEMI, though her course was also complicated by acute blood loss anemia (discharged with PPI BID per GI) and hyperkalemia (discharged with every other day [facility]).   On 7/18, Ms Lisa [name] presented after EMS was called by her family because she had become progressively weaker over the prior 2 days. Also endorsed decreased appetite, increased confusion, sleeping throughout the day, and ultimately found to have increased O2 requirement after family checked O2 sats in 70s after Ms Lisa [name] collapsed to the floor.   On presentation to ED, Ms Lisa [name] was advanced to 6L to maintain O2 sats >90%, duonebs, and NaHCO3 after ABG showed 7.23/65/46/26. Subsequent ABG worsened to 7.22/72/<31/30 and she was moved to BiPAP as she was brought into ICU. Her K was also elevated at 6.5, without EKG changes, given Calcium gluconate, Lokelma 5g and 2x 1L LR boluses. 7/18 CXR c/w moderate perihilar and lower lobe interstitial pulmonary edema. BNP elevated at 1095 with a normal lactate. Also found to have stable on chronic neutropenia with ANC 200, and stable on chronic thrombocytopenia with PLT at 101k. Temp has fluctuated 97.2 to 101.4. Her 7/18 CT Head revealed no acute intracranial process.     Over the course of afternoon and night of 7/18, consulted PCCM for BiPAP settings. Continued to manage K with Lasix and Lokelma. This morning, 7/19, K is at 6.0 and Na has improved to 128. Per PCCM recs,  will trial pt off of Bipap today, repeat CXR, and continue IV Lasix 40mg . Nephrology adding fludrocortisone and increasing Lokelma to BID for hyperkalemia.     Assessment & Plan:   Principal Problem:   CAP (community acquired pneumonia) Active Problems:   Invasive ductal carcinoma of left breast (HCC)   Adenocarcinoma of left lung (HCC)   Neutropenia (HCC)   Myelodysplastic syndrome (HCC)   Hypothyroidism   Chronic respiratory failure with hypoxia (HCC)   Chronic renal failure (CRF), stage 3b (HCC)   Normocytic anemia   HTN (hypertension)   FTT (failure to thrive) in adult   Paroxysmal atrial fibrillation (HCC)   Hyponatremia   Pancytopenia (HCC)   Hyperkalemia   CAD (coronary artery disease)   History of non-ST elevation myocardial infarction (NSTEMI)   Metabolic acidosis   Elevated brain natriuretic peptide (BNP) level   Fever, unspecified   Altered mental status  Assessment and Plan:   Acute on chronic hypoxic and hypercapnic respiratory failure c/b COPD Exacerbation, CAP and acute diastolic heart failure Pt presented with ABG 7.23/65/46/27, with increased supplemental O2 requirement from 2L baseline to 4L in ED. 7/18 CXR with moderate perihilar lower lobe interstitial infiltrates c/f pulmonary edema. Given her recent NSTEMI last month, the CXR, and BNP of 1095, was suspicious of acute HFpEF exacerbation, but not matching clinical exam and pt handled initial 1L LR boluses in ED well. 7/19 Echo with preserved EF however did demonstrate severe pulmonary artery hypertension.  Appreciate PCCM recs, has remained off bipap; transferred to telemetry bed  - Adding incentive spirometry and flutter valve.  - Ceftriaxone  and Azithromycin initiated 7/19, now completing oral doxycycline  - treated with IV furosemide with good results, now back to home oral furosemide 20 mg  - Pulmicort neb treatments 0.5 BID - IV Methylprednisolone completed, started oral prednisone 30 mg daily (IV  Protonix 40mg  daily for GI protection) - Duoneb QID  - Continue supplemental O2 - pt has diuresed >4.1L since admission and clinically improving  - resumed home oral daily furosemide 20 mg  7/22    Hyperkalemia - resolved with aggressive treatments Presented with K at 6.5, acute on chronic, and takes Lokelma every other day at home since her last discharge in June. Has continued to be elevated despite Lasix, Lokelma, Insulin with D50, but better on last recheck at 6.0. Repeat EKGs without T wave changes. Appreciate Nephrology recs. Increasing IV Lasix 40mg  to BID today, and increasing Lokelma to BID as well. 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 - IV furosemide given and now back to daily home oral furosemide - follow BMP  - DC Lokelma - fludrocortisone started by nephrology   Hypokalemia - oral replacement given - holding lokelma for now - magnesium sufficiently repleted  - recheck in AM    Physical deconditioning - PT eval recommending SNF rehab  - TOC and palliative consulted to discuss goals of care with family - family requesting CIR - requested OT eval and CIR referral made for evaluation   Symptomatic Hyponatremia Presented with Na at 122, and also appears to be an acute on chronic problem 2/2 poor PO intake, which has been worse over the last two days, per son. Pt presents with son who endorses concern of increased confusion over last two days as well. Less confused today, 7/19. Goal Na by 7/19 AM was 128-130, which was met this AM with Na at 128. Urine sodium 75 with 275 urine osm, not appearing as SIADH, nor adrenal insufficiency given morning cortisol wnl. Also improved alongside lasix, perhaps volume component was present, will continue diuresis as above. Could be Type IV RTA. - slowly improving, Will continue to monitor - nephrology consultation appreciated - sodium is improved to 135  - nephrology arranging outpatient follow up    Acute HFpEF exacerbation,  and HTN 7/18 CXR with moderate perihilar lower lobe interstitial infiltrates c/f pulmonary edema. Given her recent NSTEMI last month, the CXR, and BNP of 1095, originally suspected acute HFpEF exacerbation. While pt is not hypervolemic on exam, cannot completely exclude acute exacerbation. However, she received 2x 1L LR boluses in ED overnight and appeared to handle that well. 7/18 Echo with preserved EF, but severe pulmonary hypertension.  - Continue to monitor closely - Imdur 30 mg daily - Metoprolol succinate 25mg  - furosemide 20 mg daily      Neutropenia iso MDS/CMML-2 Presents with ANC of 200, chronic since September 2022. Her temperature fluctuated widely in the ED reports, from 97.2 up to one time febrile range at 101.4. No current infectious symptoms. UA clear, and comparing CXR with symptoms not concerning for PNA. Reviewed 6/27 Bone Marrow biopsy report which was consistent with Chronic Myelomonocytic Leukemia-2 with hypercellular bone marrow with increased blasts at 10-15%. Appears she may not have seen her heme/onc Dr. Kirtland Bouchard since those results.  - outpatient follow up with Dr. [name]    Thrombocytopenia - chronic Presents with PLT at 101k, and has fluctuated between 52k and 105k since at least Fall 2023.  - Will continue to monitor, stop heparin, use TED hoses  Recent GI Bleed Presenting Hgb at 7.8, down from 9.2 on 6/27, and 10.0 on 6/12. Last admission 6/4-6/7 was complicated by guaiac positive stool necessitating transfusion, and GI consult. Will check FOBT and consult GI if necessary. - Continue Protonix 40mg  BID for GI protection   Acute ICU Delirium - resolving  - slowly improving as respiratory failure improves - weaned down steroids to once daily - continue delirium precautions - family frequently at bedside to help re-orient patient    CAD Recent NSTEMI early June 2024 with admission. No symptoms presently. - Continue Pravastatin 10mg  at bedtime   NSTEMI - pt had  brief chest pain episode 7/22, work up showed peak HS troponin 318, 297, 299 - appreciate cardiology consult and recommendation - plan is for medical management, symptom management   Paroxysmal Afib Not a candidate for anticoagulation due to high bleeding risk - Continue Metoprolol succinate 25mg  daily   Hypothyroidism - Continue Levothyroxine 100 mcg daily    DVT prophylaxis: TED hose Code Status: Full    Disposition: DC when disposition choice decided by family    Consultants:  PCCM Nephrology Palliative care   Procedures:    Antimicrobials:  Cefepime/vanc 7/18 x 1 dose Ceftiaxone 7/19-7/21 Azithromycin 7/19-7/21 Doxycycline 7/22>>   Subjective: Patient seen and evaluated today with no new acute complaints or concerns. No acute concerns or events noted overnight.  Currently awaiting placement to CIR.  Objective: Vitals:   05/25/23 2117 05/26/23 0440 05/26/23 0809 05/26/23 0827  BP: (!) 150/83 (!) 168/97 (!) 174/90   Pulse: 84 79 90   Resp: 20 18    Temp: 97.9 F (36.6 C) 98.4 F (36.9 C)    TempSrc: Oral     SpO2: 100% 99%  99%  Weight:      Height:        Intake/Output Summary (Last 24 hours) at 05/26/2023 1340 Last data filed at 05/26/2023 0900 Gross per 24 hour  Intake 720 ml  Output 400 ml  Net 320 ml   Filed Weights   05/20/23 0237 05/22/23 0500 05/23/23 0342  Weight: 75.3 kg 75 kg 75.4 kg    Examination:  General exam: Appears calm and comfortable  Respiratory system: Clear to auscultation. Respiratory effort normal. 2.5L [place] Cardiovascular system: S1 & S2 heard, RRR.  Gastrointestinal system: Abdomen is soft Central nervous system: Alert and awake Extremities: No edema Skin: No significant lesions noted Psychiatry: Flat affect.    Data Reviewed: I have personally reviewed following labs and imaging studies  CBC: Recent Labs  Lab 05/20/23 0245 05/21/23 0404 05/22/23 0435 05/23/23 0410 05/23/23 1914 05/24/23 0414 05/25/23 0441   WBC 5.5   < > 3.0* 2.1* 3.0* 5.1 3.9*  NEUTROABS 0.2*  --   --   --   --   --   --   HGB 7.8*   < > 7.3* 7.7* 7.8* 8.1* 7.9*  HCT 27.6*   < > 26.5* 27.1* 27.9* 27.7* 28.2*  MCV 103.0*   < > 103.9* 101.9* 103.0* 101.5* 102.9*  PLT 101*   < > 75* 68* 69* 72* 67*   < > = values in this interval not displayed.   Basic Metabolic Panel: Recent Labs  Lab 05/21/23 0404 05/21/23 0814 05/22/23 1659 05/23/23 0410 05/24/23 0414 05/24/23 2144 05/25/23 0441  NA 126*   < > 132* 133* 136 134* 135  K 5.9*   < > 4.1 3.6 3.1* 3.6 3.2*  CL 96*   < > 97* 97*  97* 97* 98  CO2 24   < > 28 27 30 26 25   GLUCOSE 109*   < > 137* 131* 96 136* 95  BUN 17   < > 23 22 22 19 19   CREATININE 1.22*   < > 1.14* 1.03* 0.98 1.08* 1.09*  CALCIUM 7.9*   < > 8.2* 8.5* 8.8* 8.7* 8.7*  MG 2.1  --   --   --  1.6* 2.2 2.1   < > = values in this interval not displayed.   GFR: Estimated Creatinine Clearance: 35.4 mL/min (A) (by C-G formula based on SCr of 1.09 mg/dL (H)). Liver Function Tests: Recent Labs  Lab 05/20/23 0245 05/21/23 0404 05/24/23 2144  AST 70* 55* 36  ALT 52* 39 24  ALKPHOS 59 47 44  BILITOT 0.8 0.9 0.7  PROT 8.2* 7.1 8.0  ALBUMIN 3.2* 2.6* 3.1*   No results for input(s): "LIPASE", "AMYLASE" in the last 168 hours. Recent Labs  Lab 05/20/23 0246 05/23/23 0744  AMMONIA 30 24   Coagulation Profile: Recent Labs  Lab 05/20/23 0245  INR 1.1   Cardiac Enzymes: No results for input(s): "CKTOTAL", "CKMB", "CKMBINDEX", "TROPONINI" in the last 168 hours. BNP (last 3 results) No results for input(s): "PROBNP" in the last 8760 hours. HbA1C: No results for input(s): "HGBA1C" in the last 72 hours. CBG: Recent Labs  Lab 05/20/23 2321 05/21/23 0052 05/21/23 0328 05/23/23 1639 05/23/23 2203  GLUCAP 97 97 110* 117* 119*   Lipid Profile: No results for input(s): "CHOL", "HDL", "LDLCALC", "TRIG", "CHOLHDL", "LDLDIRECT" in the last 72 hours. Thyroid Function Tests: No results for input(s):  "TSH", "T4TOTAL", "FREET4", "T3FREE", "THYROIDAB" in the last 72 hours. Anemia Panel: No results for input(s): "VITAMINB12", "FOLATE", "FERRITIN", "TIBC", "IRON", "RETICCTPCT" in the last 72 hours. Sepsis Labs: Recent Labs  Lab 05/20/23 0245 05/20/23 0812  PROCALCITON  --  0.16  LATICACIDVEN 1.5  --     Recent Results (from the past 240 hour(s))  Resp panel by RT-PCR (RSV, Flu A&B, Covid) Anterior Nasal Swab     Status: None   Collection Time: 05/20/23  2:45 AM   Specimen: Anterior Nasal Swab  Result Value Ref Range Status   SARS Coronavirus 2 by RT PCR NEGATIVE NEGATIVE Final    Comment: (NOTE) SARS-CoV-2 target nucleic acids are NOT DETECTED.  The SARS-CoV-2 RNA is generally detectable in upper respiratory specimens during the acute phase of infection. The lowest concentration of SARS-CoV-2 viral copies this assay can detect is 138 copies/mL. A negative result does not preclude SARS-Cov-2 infection and should not be used as the sole basis for treatment or other patient management decisions. A negative result may occur with  improper specimen collection/handling, submission of specimen other than nasopharyngeal swab, presence of viral mutation(s) within the areas targeted by this assay, and inadequate number of viral copies(<138 copies/mL). A negative result must be combined with clinical observations, patient history, and epidemiological information. The expected result is Negative.  Fact Sheet for Patients:  BloggerCourse.com  Fact Sheet for Healthcare Providers:  SeriousBroker.it  This test is no t yet approved or cleared by the [place] FDA and  has been authorized for detection and/or diagnosis of SARS-CoV-2 by FDA under an Emergency Use Authorization (EUA). This EUA will remain  in effect (meaning this test can be used) for the duration of the COVID-19 declaration under Section 564(b)(1) of the Act,  21 U.S.C.section 360bbb-3(b)(1), unless the authorization is terminated  or revoked sooner.  Influenza A by PCR NEGATIVE NEGATIVE Final   Influenza B by PCR NEGATIVE NEGATIVE Final    Comment: (NOTE) The Xpert Xpress SARS-CoV-2/FLU/RSV plus assay is intended as an aid in the diagnosis of influenza from Nasopharyngeal swab specimens and should not be used as a sole basis for treatment. Nasal washings and aspirates are unacceptable for Xpert Xpress SARS-CoV-2/FLU/RSV testing.  Fact Sheet for Patients: BloggerCourse.com  Fact Sheet for Healthcare Providers: SeriousBroker.it  This test is not yet approved or cleared by the [place] FDA and has been authorized for detection and/or diagnosis of SARS-CoV-2 by FDA under an Emergency Use Authorization (EUA). This EUA will remain in effect (meaning this test can be used) for the duration of the COVID-19 declaration under Section 564(b)(1) of the Act, 21 U.S.C. section 360bbb-3(b)(1), unless the authorization is terminated or revoked.     Resp Syncytial Virus by PCR NEGATIVE NEGATIVE Final    Comment: (NOTE) Fact Sheet for Patients: BloggerCourse.com  Fact Sheet for Healthcare Providers: SeriousBroker.it  This test is not yet approved or cleared by the [place] FDA and has been authorized for detection and/or diagnosis of SARS-CoV-2 by FDA under an Emergency Use Authorization (EUA). This EUA will remain in effect (meaning this test can be used) for the duration of the COVID-19 declaration under Section 564(b)(1) of the Act, 21 U.S.C. section 360bbb-3(b)(1), unless the authorization is terminated or revoked.  Performed at [facility], [address], [place], [place] [address]   Blood Culture (routine x 2)     Status: None   Collection Time: 05/20/23  2:45 AM   Specimen: Left Antecubital; Blood  Result Value Ref  Range Status   Specimen Description LEFT ANTECUBITAL  Final   Special Requests   Final    BOTTLES DRAWN AEROBIC AND ANAEROBIC Blood Culture adequate volume   Culture   Final    NO GROWTH 5 DAYS Performed at [facility], [address], [place], [place] [address]    Report Status 05/25/2023 FINAL  Final  Blood Culture (routine x 2)     Status: None   Collection Time: 05/20/23  2:50 AM   Specimen: BLOOD RIGHT HAND  Result Value Ref Range Status   Specimen Description BLOOD RIGHT HAND  Final   Special Requests   Final    BOTTLES DRAWN AEROBIC AND ANAEROBIC Blood Culture adequate volume   Culture   Final    NO GROWTH 5 DAYS Performed at [facility], [address] [place], [place] [address]    Report Status 05/25/2023 FINAL  Final  MRSA Next Gen by PCR, Nasal     Status: None   Collection Time: 05/20/23 12:45 PM   Specimen: Nasal Mucosa; Nasal Swab  Result Value Ref Range Status   MRSA by PCR Next Gen NOT DETECTED NOT DETECTED Final    Comment: (NOTE) The GeneXpert MRSA Assay (FDA approved for NASAL specimens only), is one component of a comprehensive MRSA colonization surveillance program. It is not intended to diagnose MRSA infection nor to guide or monitor treatment for MRSA infections. Test performance is not FDA approved in patients less than 33 years old. Performed at [facility], [address], [place], [place] [address]          Radiology Studies: No results found.      Scheduled Meds:  doxycycline  100 mg Oral Q12H   fludrocortisone  0.1 mg Oral Daily   furosemide  20 mg Oral Daily   isosorbide mononitrate  60 mg Oral Daily   levothyroxine  100 mcg Oral QAC breakfast   metoprolol succinate  25 mg Oral Daily   mirtazapine  15 mg Oral QHS   mouth rinse  15 mL Mouth Rinse 4 times per day   pantoprazole  40 mg Oral QHS   potassium chloride  40 mEq Oral BID   pravastatin  10 mg Oral QPM   predniSONE  30 mg Oral Q breakfast     LOS: 6 days    Time  spent: 35 minutes    Sylva Overley Hoover Brunette, DO [facility]  If 7PM-7AM, please contact night-coverage [email or web address] 05/26/2023, 1:40 PM

## 2023-05-26 NOTE — Progress Notes (Signed)
  Inpatient Rehabilitation Admissions Coordinator   I spoke to patient's POA, grandson, Dr Georgiana Shore, by phone. Previous CIR admission 10/2020.  We discussed goals and expectations of a possible CIR admit. He prefers CIR for rehab.Patient's daughter, Steward Drone and son, Alden Server can provide 24/7 supervision at home. Patient was Mod I with RW and cane pta and needed some assist with adls. I will discuss case with our Rehab MD and clarify if patient felt to be a candidate, as well as to check the likelihood of bed availability over the next 24 to 48 hrs for admit.  Please call me with any questions.   Ottie Glazier, RN, MSN Rehab Admissions Coordinator [phone]

## 2023-05-27 ENCOUNTER — Telehealth (HOSPITAL_COMMUNITY): Payer: Self-pay

## 2023-05-27 DIAGNOSIS — J189 Pneumonia, unspecified organism: Secondary | ICD-10-CM | POA: Diagnosis not present

## 2023-05-27 LAB — BASIC METABOLIC PANEL
Anion gap: 9 (ref 5–15)
BUN: 16 mg/dL (ref 8–23)
CO2: 30 mmol/L (ref 22–32)
Calcium: 8.9 mg/dL (ref 8.9–10.3)
Chloride: 97 mmol/L — ABNORMAL LOW (ref 98–111)
Creatinine, Ser: 0.94 mg/dL (ref 0.44–1.00)
GFR, Estimated: 59 mL/min — ABNORMAL LOW (ref >60)
Glucose, Bld: 80 mg/dL (ref 70–99)
Potassium: 4.6 mmol/L (ref 3.5–5.1)
Sodium: 136 mmol/L (ref 135–145)

## 2023-05-27 LAB — CBC
HCT: 31.3 % — ABNORMAL LOW (ref 36.0–46.0)
Hemoglobin: 8.5 g/dL — ABNORMAL LOW (ref 12.0–15.0)
MCH: 29 pg (ref 26.0–34.0)
MCHC: 27.2 g/dL — ABNORMAL LOW (ref 30.0–36.0)
MCV: 106.8 fL — ABNORMAL HIGH (ref 80.0–100.0)
Platelets: 81 10*3/uL — ABNORMAL LOW (ref 150–400)
RBC: 2.93 MIL/uL — ABNORMAL LOW (ref 3.87–5.11)
RDW: 23.4 % — ABNORMAL HIGH (ref 11.5–15.5)
WBC: 4.6 10*3/uL (ref 4.0–10.5)

## 2023-05-27 LAB — GLUCOSE, CAPILLARY: Glucose-Capillary: 122 mg/dL — ABNORMAL HIGH (ref 70–99)

## 2023-05-27 LAB — MAGNESIUM: Magnesium: 1.6 mg/dL — ABNORMAL LOW (ref 1.7–2.4)

## 2023-05-27 MED ORDER — MAGNESIUM SULFATE 2 GM/50ML IV SOLN
2.0000 g | Freq: Once | INTRAVENOUS | Status: AC
  Administered 2023-05-27: 2 g via INTRAVENOUS
  Filled 2023-05-27: qty 50

## 2023-05-27 MED ORDER — ALUM & MAG HYDROXIDE-SIMETH 200-200-20 MG/5ML PO SUSP
30.0000 mL | ORAL | Status: DC | PRN
  Administered 2023-05-27: 30 mL via ORAL
  Filled 2023-05-27: qty 30

## 2023-05-27 NOTE — Progress Notes (Signed)
PROGRESS NOTE    Lisa [name]  WUX:[id number] DOB: 1933/12/17 DOA: 05/20/2023 PCP: [name], MD   Brief Narrative:  89 y.o. female with medical history significant for COPD c/b chronic hypoxic respiratory failure on 2L baseline, HFpEF, CKD3b, Paroxysmal Afib, and MDS/CMML c/b chronic neutropenia and thrombocytopenia, and chronic hyperkalemia, who presented with weakness and was admitted for acute on chronic hypoxic and hypercapnic respiratory failure and COPD exacerbation.   Lisa [name] was recently admitted 6/4-6/7 for an NSTEMI, though her course was also complicated by acute blood loss anemia (discharged with PPI BID per GI) and hyperkalemia (discharged with every other day [facility]).   On 7/18, Lisa [name] presented after EMS was called by her family because she had become progressively weaker over the prior 2 days. Also endorsed decreased appetite, increased confusion, sleeping throughout the day, and ultimately found to have increased O2 requirement after family checked O2 sats in 70s after Lisa [name] collapsed to the floor.   On presentation to ED, Lisa [name] was advanced to 6L to maintain O2 sats >90%, duonebs, and NaHCO3 after ABG showed 7.23/65/46/26. Subsequent ABG worsened to 7.22/72/<31/30 and she was moved to BiPAP as she was brought into ICU. Her K was also elevated at 6.5, without EKG changes, given Calcium gluconate, Lokelma 5g and 2x 1L LR boluses. 7/18 CXR c/w moderate perihilar and lower lobe interstitial pulmonary edema. BNP elevated at 1095 with a normal lactate. Also found to have stable on chronic neutropenia with ANC 200, and stable on chronic thrombocytopenia with PLT at 101k. Temp has fluctuated 97.2 to 101.4. Her 7/18 CT Head revealed no acute intracranial process.     Over the course of afternoon and night of 7/18, consulted PCCM for BiPAP settings. Continued to manage K with Lasix and Lokelma. This morning, 7/19, K is at 6.0 and Na has improved to 128. Per PCCM recs,  will trial pt off of Bipap today, repeat CXR, and continue IV Lasix 40mg . Nephrology adding fludrocortisone and increasing Lokelma to BID for hyperkalemia.     Assessment & Plan:   Principal Problem:   CAP (community acquired pneumonia) Active Problems:   Invasive ductal carcinoma of left breast (HCC)   Adenocarcinoma of left lung (HCC)   Neutropenia (HCC)   Myelodysplastic syndrome (HCC)   Hypothyroidism   Chronic respiratory failure with hypoxia (HCC)   Chronic renal failure (CRF), stage 3b (HCC)   Normocytic anemia   HTN (hypertension)   FTT (failure to thrive) in adult   Paroxysmal atrial fibrillation (HCC)   Hyponatremia   Pancytopenia (HCC)   Hyperkalemia   CAD (coronary artery disease)   History of non-ST elevation myocardial infarction (NSTEMI)   Metabolic acidosis   Elevated brain natriuretic peptide (BNP) level   Fever, unspecified   Altered mental status  Assessment and Plan:   Acute on chronic hypoxic and hypercapnic respiratory failure c/b COPD Exacerbation, CAP and acute diastolic heart failure Pt presented with ABG 7.23/65/46/27, with increased supplemental O2 requirement from 2L baseline to 4L in ED. 7/18 CXR with moderate perihilar lower lobe interstitial infiltrates c/f pulmonary edema. Given her recent NSTEMI last month, the CXR, and BNP of 1095, was suspicious of acute HFpEF exacerbation, but not matching clinical exam and pt handled initial 1L LR boluses in ED well. 7/19 Echo with preserved EF however did demonstrate severe pulmonary artery hypertension.  Appreciate PCCM recs, has remained off bipap; transferred to telemetry bed  - Adding incentive spirometry and flutter valve.  - Ceftriaxone  and Azithromycin initiated 7/19, now completing oral doxycycline  - treated with IV furosemide with good results, now back to home oral furosemide 20 mg  - Pulmicort neb treatments 0.5 BID - IV Methylprednisolone completed, started oral prednisone 30 mg daily (IV  Protonix 40mg  daily for GI protection) - Duoneb QID  - Continue supplemental O2 - pt has diuresed >4.1L since admission and clinically improving  - resumed home oral daily furosemide 20 mg  7/22    Hyperkalemia - resolved with aggressive treatments Presented with K at 6.5, acute on chronic, and takes Lokelma every other day at home since her last discharge in June. Has continued to be elevated despite Lasix, Lokelma, Insulin with D50, but better on last recheck at 6.0. Repeat EKGs without T wave changes. Appreciate Nephrology recs. Increasing IV Lasix 40mg  to BID today, and increasing Lokelma to BID as well. 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 - IV furosemide given and now back to daily home oral furosemide - follow BMP  - DC Lokelma - fludrocortisone started by nephrology   Hypomagnesemia - magnesium  repleted  - recheck in AM    Physical deconditioning - PT eval recommending SNF rehab  - TOC and palliative consulted to discuss goals of care with family -Approved for CIR, awaiting placement   Symptomatic Hyponatremia-resolved Presented with Na at 122, and also appears to be an acute on chronic problem 2/2 poor PO intake, which has been worse over the last two days, per son. Pt presents with son who endorses concern of increased confusion over last two days as well. Less confused today, 7/19. Goal Na by 7/19 AM was 128-130, which was met this AM with Na at 128. Urine sodium 75 with 275 urine osm, not appearing as SIADH, nor adrenal insufficiency given morning cortisol wnl. Also improved alongside lasix, perhaps volume component was present, will continue diuresis as above. Could be Type IV RTA. - slowly improving, Will continue to monitor - nephrology consultation appreciated - sodium is improved to 135  - nephrology arranging outpatient follow up    Acute HFpEF exacerbation, and HTN 7/18 CXR with moderate perihilar lower lobe interstitial infiltrates c/f pulmonary  edema. Given her recent NSTEMI last month, the CXR, and BNP of 1095, originally suspected acute HFpEF exacerbation. While pt is not hypervolemic on exam, cannot completely exclude acute exacerbation. However, she received 2x 1L LR boluses in ED overnight and appeared to handle that well. 7/18 Echo with preserved EF, but severe pulmonary hypertension.  - Continue to monitor closely - Imdur 30 mg daily - Metoprolol succinate 25mg  - furosemide 20 mg daily      Neutropenia iso MDS/CMML-2 Presents with ANC of 200, chronic since September 2022. Her temperature fluctuated widely in the ED reports, from 97.2 up to one time febrile range at 101.4. No current infectious symptoms. UA clear, and comparing CXR with symptoms not concerning for PNA. Reviewed 6/27 Bone Marrow biopsy report which was consistent with Chronic Myelomonocytic Leukemia-2 with hypercellular bone marrow with increased blasts at 10-15%. Appears she may not have seen her heme/onc Dr. Kirtland Bouchard since those results.  - outpatient follow up with Dr. [name]    Thrombocytopenia - chronic Presents with PLT at 101k, and has fluctuated between 52k and 105k since at least Fall 2023.  - Will continue to monitor, stop heparin, use TED hoses   Recent GI Bleed Presenting Hgb at 7.8, down from 9.2 on 6/27, and 10.0 on 6/12. Last admission  6/4-6/7 was complicated by guaiac positive stool necessitating transfusion, and GI consult. Will check FOBT and consult GI if necessary. - Continue Protonix 40mg  BID for GI protection   Acute ICU Delirium - resolving  - slowly improving as respiratory failure improves - weaned down steroids to once daily - continue delirium precautions - family frequently at bedside to help re-orient patient    CAD Recent NSTEMI early June 2024 with admission. No symptoms presently. - Continue Pravastatin 10mg  at bedtime   NSTEMI - pt had brief chest pain episode 7/22, work up showed peak HS troponin 318, 297, 299 - appreciate  cardiology consult and recommendation - plan is for medical management, symptom management   Paroxysmal Afib Not a candidate for anticoagulation due to high bleeding risk - Continue Metoprolol succinate 25mg  daily   Hypothyroidism - Continue Levothyroxine 100 mcg daily    DVT prophylaxis: TED hose Code Status: Full    Disposition: DC when disposition choice decided by family    Consultants:  PCCM Nephrology Palliative care   Procedures:    Antimicrobials:  Cefepime/vanc 7/18 x 1 dose Ceftiaxone 7/19-7/21 Azithromycin 7/19-7/21 Doxycycline 7/22>>   Subjective: Patient seen and evaluated today with no new acute complaints or concerns. No acute concerns or events noted overnight.  Currently awaiting placement to CIR.  Objective: Vitals:   05/26/23 2059 05/27/23 0216 05/27/23 0413 05/27/23 1245  BP: (!) 145/73 (!) 158/89 138/78 125/85  Pulse: (!) 52   77  Resp: 18  20 18   Temp: 98.9 F (37.2 C)  98.3 F (36.8 C) 97.9 F (36.6 C)  TempSrc:    Oral  SpO2: 98%  100% 100%  Weight:      Height:        Intake/Output Summary (Last 24 hours) at 05/27/2023 1315 Last data filed at 05/27/2023 0500 Gross per 24 hour  Intake 480 ml  Output 400 ml  Net 80 ml   Filed Weights   05/20/23 0237 05/22/23 0500 05/23/23 0342  Weight: 75.3 kg 75 kg 75.4 kg    Examination:  General exam: Appears calm and comfortable  Respiratory system: Clear to auscultation. Respiratory effort normal. 2.5L [place] Cardiovascular system: S1 & S2 heard, RRR.  Gastrointestinal system: Abdomen is soft Central nervous system: Alert and awake Extremities: No edema Skin: No significant lesions noted Psychiatry: Flat affect.    Data Reviewed: I have personally reviewed following labs and imaging studies  CBC: Recent Labs  Lab 05/23/23 0410 05/23/23 1914 05/24/23 0414 05/25/23 0441 05/27/23 0408  WBC 2.1* 3.0* 5.1 3.9* 4.6  HGB 7.7* 7.8* 8.1* 7.9* 8.5*  HCT 27.1* 27.9* 27.7* 28.2* 31.3*  MCV  101.9* 103.0* 101.5* 102.9* 106.8*  PLT 68* 69* 72* 67* 81*   Basic Metabolic Panel: Recent Labs  Lab 05/21/23 0404 05/21/23 0814 05/23/23 0410 05/24/23 0414 05/24/23 2144 05/25/23 0441 05/27/23 0408  NA 126*   < > 133* 136 134* 135 136  K 5.9*   < > 3.6 3.1* 3.6 3.2* 4.6  CL 96*   < > 97* 97* 97* 98 97*  CO2 24   < > 27 30 26 25 30   GLUCOSE 109*   < > 131* 96 136* 95 80  BUN 17   < > 22 22 19 19 16   CREATININE 1.22*   < > 1.03* 0.98 1.08* 1.09* 0.94  CALCIUM 7.9*   < > 8.5* 8.8* 8.7* 8.7* 8.9  MG 2.1  --   --  1.6* 2.2 2.1 1.6*   < > =  values in this interval not displayed.   GFR: Estimated Creatinine Clearance: 41 mL/min (by C-G formula based on SCr of 0.94 mg/dL). Liver Function Tests: Recent Labs  Lab 05/21/23 0404 05/24/23 2144  AST 55* 36  ALT 39 24  ALKPHOS 47 44  BILITOT 0.9 0.7  PROT 7.1 8.0  ALBUMIN 2.6* 3.1*   No results for input(s): "LIPASE", "AMYLASE" in the last 168 hours. Recent Labs  Lab 05/23/23 0744  AMMONIA 24   Coagulation Profile: No results for input(s): "INR", "PROTIME" in the last 168 hours.  Cardiac Enzymes: No results for input(s): "CKTOTAL", "CKMB", "CKMBINDEX", "TROPONINI" in the last 168 hours. BNP (last 3 results) No results for input(s): "PROBNP" in the last 8760 hours. HbA1C: No results for input(s): "HGBA1C" in the last 72 hours. CBG: Recent Labs  Lab 05/20/23 2321 05/21/23 0052 05/21/23 0328 05/23/23 1639 05/23/23 2203  GLUCAP 97 97 110* 117* 119*   Lipid Profile: No results for input(s): "CHOL", "HDL", "LDLCALC", "TRIG", "CHOLHDL", "LDLDIRECT" in the last 72 hours. Thyroid Function Tests: No results for input(s): "TSH", "T4TOTAL", "FREET4", "T3FREE", "THYROIDAB" in the last 72 hours. Anemia Panel: No results for input(s): "VITAMINB12", "FOLATE", "FERRITIN", "TIBC", "IRON", "RETICCTPCT" in the last 72 hours. Sepsis Labs: No results for input(s): "PROCALCITON", "LATICACIDVEN" in the last 168 hours.   Recent  Results (from the past 240 hour(s))  Resp panel by RT-PCR (RSV, Flu A&B, Covid) Anterior Nasal Swab     Status: None   Collection Time: 05/20/23  2:45 AM   Specimen: Anterior Nasal Swab  Result Value Ref Range Status   SARS Coronavirus 2 by RT PCR NEGATIVE NEGATIVE Final    Comment: (NOTE) SARS-CoV-2 target nucleic acids are NOT DETECTED.  The SARS-CoV-2 RNA is generally detectable in upper respiratory specimens during the acute phase of infection. The lowest concentration of SARS-CoV-2 viral copies this assay can detect is 138 copies/mL. A negative result does not preclude SARS-Cov-2 infection and should not be used as the sole basis for treatment or other patient management decisions. A negative result may occur with  improper specimen collection/handling, submission of specimen other than nasopharyngeal swab, presence of viral mutation(s) within the areas targeted by this assay, and inadequate number of viral copies(<138 copies/mL). A negative result must be combined with clinical observations, patient history, and epidemiological information. The expected result is Negative.  Fact Sheet for Patients:  BloggerCourse.com  Fact Sheet for Healthcare Providers:  SeriousBroker.it  This test is no t yet approved or cleared by the [place] FDA and  has been authorized for detection and/or diagnosis of SARS-CoV-2 by FDA under an Emergency Use Authorization (EUA). This EUA will remain  in effect (meaning this test can be used) for the duration of the COVID-19 declaration under Section 564(b)(1) of the Act, 21 U.S.C.section 360bbb-3(b)(1), unless the authorization is terminated  or revoked sooner.       Influenza A by PCR NEGATIVE NEGATIVE Final   Influenza B by PCR NEGATIVE NEGATIVE Final    Comment: (NOTE) The Xpert Xpress SARS-CoV-2/FLU/RSV plus assay is intended as an aid in the diagnosis of influenza from Nasopharyngeal  swab specimens and should not be used as a sole basis for treatment. Nasal washings and aspirates are unacceptable for Xpert Xpress SARS-CoV-2/FLU/RSV testing.  Fact Sheet for Patients: BloggerCourse.com  Fact Sheet for Healthcare Providers: SeriousBroker.it  This test is not yet approved or cleared by the [place] FDA and has been authorized for detection and/or diagnosis of SARS-CoV-2 by FDA  under an Emergency Use Authorization (EUA). This EUA will remain in effect (meaning this test can be used) for the duration of the COVID-19 declaration under Section 564(b)(1) of the Act, 21 U.S.C. section 360bbb-3(b)(1), unless the authorization is terminated or revoked.     Resp Syncytial Virus by PCR NEGATIVE NEGATIVE Final    Comment: (NOTE) Fact Sheet for Patients: BloggerCourse.com  Fact Sheet for Healthcare Providers: SeriousBroker.it  This test is not yet approved or cleared by the [place] FDA and has been authorized for detection and/or diagnosis of SARS-CoV-2 by FDA under an Emergency Use Authorization (EUA). This EUA will remain in effect (meaning this test can be used) for the duration of the COVID-19 declaration under Section 564(b)(1) of the Act, 21 U.S.C. section 360bbb-3(b)(1), unless the authorization is terminated or revoked.  Performed at [facility], [address] [place], [place] [address]   Blood Culture (routine x 2)     Status: None   Collection Time: 05/20/23  2:45 AM   Specimen: Left Antecubital; Blood  Result Value Ref Range Status   Specimen Description LEFT ANTECUBITAL  Final   Special Requests   Final    BOTTLES DRAWN AEROBIC AND ANAEROBIC Blood Culture adequate volume   Culture   Final    NO GROWTH 5 DAYS Performed at [facility], [address] [place], [place] [address]    Report Status 05/25/2023 FINAL  Final  Blood Culture (routine x  2)     Status: None   Collection Time: 05/20/23  2:50 AM   Specimen: BLOOD RIGHT HAND  Result Value Ref Range Status   Specimen Description BLOOD RIGHT HAND  Final   Special Requests   Final    BOTTLES DRAWN AEROBIC AND ANAEROBIC Blood Culture adequate volume   Culture   Final    NO GROWTH 5 DAYS Performed at [facility], [address], [place], [place] [address]    Report Status 05/25/2023 FINAL  Final  MRSA Next Gen by PCR, Nasal     Status: None   Collection Time: 05/20/23 12:45 PM   Specimen: Nasal Mucosa; Nasal Swab  Result Value Ref Range Status   MRSA by PCR Next Gen NOT DETECTED NOT DETECTED Final    Comment: (NOTE) The GeneXpert MRSA Assay (FDA approved for NASAL specimens only), is one component of a comprehensive MRSA colonization surveillance program. It is not intended to diagnose MRSA infection nor to guide or monitor treatment for MRSA infections. Test performance is not FDA approved in patients less than 54 years old. Performed at [facility], [address], [place], [place] [address]          Radiology Studies: No results found.      Scheduled Meds:  fludrocortisone  0.1 mg Oral Daily   furosemide  20 mg Oral Daily   isosorbide mononitrate  60 mg Oral Daily   levothyroxine  100 mcg Oral QAC breakfast   metoprolol succinate  25 mg Oral Daily   mirtazapine  15 mg Oral QHS   mouth rinse  15 mL Mouth Rinse 4 times per day   pantoprazole  40 mg Oral QHS   pravastatin  10 mg Oral QPM   predniSONE  30 mg Oral Q breakfast     LOS: 7 days    Time spent: 35 minutes    Emaan Gary Hoover Brunette, DO [facility]  If 7PM-7AM, please contact night-coverage [email or web address] 05/27/2023, 1:15 PM

## 2023-05-27 NOTE — Progress Notes (Signed)
Patient presented tachycardic on telemetry with HR reaching 130s nonsustained, EKG obtained and showed AFIB with RVR, Dr. Thomes Dinning notified, no new orders at this time.

## 2023-05-27 NOTE — Telephone Encounter (Signed)
Returned call to grandson discussed pt.'s progress this session and approriate DC following [facility] per evaluation therapists POC.   Becky Sax, LPTA/CLT; [place] [phone]

## 2023-05-27 NOTE — Progress Notes (Signed)
Physical Therapy Treatment Patient Details Name: Lisa [name] MRN: [id number] DOB: 02-26-1934 Today's Date: 05/27/2023   History of Present Illness 89 y.o. female with medical history significant for COPD c/b chronic hypoxic respiratory failure on 2L baseline, HFpEF, CKD3b, Paroxysmal Afib, and MDS/CMML c/b chronic neutropenia and thrombocytopenia, and chronic hyperkalemia, who presented with weakness and was admitted for acute on chronic hypoxic and hypercapnic respiratory failure and COPD exacerbation.     Lisa [name] was recently admitted 6/4-6/7 for an NSTEMI, though her course was also complicated by acute blood loss anemia (discharged with PPI BID per GI) and hyperkalemia (discharged with every other day [facility]).     On 7/18, Lisa [name] presented after EMS was called by her family because she had become progressively weaker over the prior 2 days. Also endorsed decreased appetite, increased confusion, sleeping throughout the day, and ultimately found to have increased O2 requirement after family checked O2 sats in 70s after Lisa [name] collapsed to the floor.     On presentation to ED, Lisa [name] was advanced to 6L to maintain O2 sats >90%, duonebs, and NaHCO3 after ABG showed 7.23/65/46/26. Subsequent ABG worsened to 7.22/72/<31/30 and she was moved to BiPAP as she was brought into ICU. Her K was also elevated at 6.5, without EKG changes, given Calcium gluconate, Lokelma 5g and 2x 1L LR boluses. 7/18 CXR c/w moderate perihilar and lower lobe interstitial pulmonary edema. BNP elevated at 1095 with a normal lactate. Also found to have stable on chronic neutropenia with ANC 200, and stable on chronic thrombocytopenia with PLT at 101k. Temp has fluctuated 97.2 to 101.4. Her 7/18 CT Head revealed no acute intracranial process.       Over the course of afternoon and night of 7/18, consulted PCCM for BiPAP settings. Continued to manage K with Lasix and Lokelma. This morning, 7/19, K is at 6.0 and Na has  improved to 128. Per PCCM recs, will trial pt off of Bipap today, repeat CXR, and continue IV Lasix 40mg . Nephrology adding fludrocortisone and increasing Lokelma to BID for hyperkalemia.    PT Comments  Pt supine in bed and willing to participate with therapy.  Pt presents with weakness noted by increased time with bed mobility and transfer training.  Verbal cueing for handplacement to assist with standing safely and use of RW for stability.  Slow labored cadence and limited by fatigue following 74ft.  Pt left in chair with call bell within reach and chair alarm set.  Seated LE exercises complete with good form and control following initial cueing.      Assistance Recommended at Discharge    If plan is discharge home, recommend the following:  Can travel by private vehicle           Equipment Recommendations       Recommendations for Other Services       Precautions / Restrictions Precautions Precautions: Fall Restrictions Weight Bearing Restrictions: No     Mobility  Bed Mobility Overal bed mobility: Modified Independent Bed Mobility: Supine to Sit     Supine to sit: Modified independent (Device/Increase time)     General bed mobility comments: increased time and labored    Transfers Overall transfer level: Modified independent Equipment used: Rolling walker (2 wheels) Transfers: Sit to/from Stand Sit to Stand: Min assist (cueing for hand placement)           General transfer comment: use of RW during transfers, sit to stand required min assist with cueing for handplacement  Ambulation/Gait Ambulation/Gait assistance: Min assist Gait Distance (Feet): 20 Feet Assistive device: Rolling walker (2 wheels) Gait Pattern/deviations: Step-through pattern, Decreased step length - right, Decreased step length - left, Decreased stride length       General Gait Details: use of RW, slightly labored   Stairs             Wheelchair Mobility     Tilt  Bed    Modified Rankin (Stroke Patients Only)       Balance                                            Cognition Arousal/Alertness: Awake/alert Behavior During Therapy: WFL for tasks assessed/performed Overall Cognitive Status: Within Functional Limits for tasks assessed                                          Exercises General Exercises - Lower Extremity Ankle Circles/Pumps: AROM, Strengthening, Right, Left, Seated, 10 reps Long Arc Quad: AROM, Strengthening, Right, Left, 10 reps, Seated Hip ABduction/ADduction: Strengthening, Both, 10 reps, Supine Hip Flexion/Marching: AROM, Strengthening, Right, Left, 10 reps, Seated Toe Raises: AROM, Strengthening, Right, Left, 10 reps, Seated Heel Raises: AROM, Strengthening, Right, Left, 10 reps, Seated    General Comments        Pertinent Vitals/Pain Pain Assessment Pain Assessment: No/denies pain    Home Living                          Prior Function            PT Goals (current goals can now be found in the care plan section)      Frequency           PT Plan Current plan remains appropriate    Co-evaluation              AM-PAC PT "6 Clicks" Mobility   Outcome Measure  Help needed turning from your back to your side while in a flat bed without using bedrails?: A Little Help needed moving from lying on your back to sitting on the side of a flat bed without using bedrails?: A Little Help needed moving to and from a bed to a chair (including a wheelchair)?: A Little Help needed standing up from a chair using your arms (e.g., wheelchair or bedside chair)?: A Little Help needed to walk in hospital room?: A Lot Help needed climbing 3-5 steps with a railing? : Total 6 Click Score: 15    End of Session Equipment Utilized During Treatment: Gait belt Activity Tolerance: Patient tolerated treatment well;Patient limited by fatigue Patient left: in chair;with call  bell/phone within reach Nurse Communication: Mobility status PT Visit Diagnosis: Unsteadiness on feet (R26.81);Muscle weakness (generalized) (M62.81);Difficulty in walking, not elsewhere classified (R26.2)     Time: 0800-0820 PT Time Calculation (min) (ACUTE ONLY): 20 min  Charges:    $Therapeutic Activity: 8-22 mins PT General Charges $$ ACUTE PT VISIT: 1 Visit                     Becky Sax, LPTA/CLT; CBIS [phone]  Juel Burrow 05/27/2023, 8:59 AM

## 2023-05-27 NOTE — Progress Notes (Addendum)
  Inpatient Rehabilitation Admissions Coordinator   I contacted pt's POA, Grandson, Dr Georgiana Shore by phone. We discussed that patient's rehab care can be managed at a lower level of care such as a SNF, and not to need AIR/CIR level rehab. Lacks the medical necessity for Hospital Level rehab . He is not in agreement with that assessment. Dr Riley Kill, Medical director for CIR, to follow up with him. TOC and Dr Sherryll Burger made aware.  Ottie Glazier, RN, MSN Rehab Admissions Coordinator [phone] 05/27/2023 11:05 AM  Dr Riley Kill spoke with POA, Dr Georgiana Shore by phone. Dr Riley Kill in agreement to admit to CIR when bed available. We will follow up tomorrow.  Ottie Glazier, RN, MSN Rehab Admissions Coordinator [phone] 05/27/2023 12:03 PM  '

## 2023-05-27 NOTE — PMR Pre-admission (Signed)
PMR Admission Coordinator Pre-Admission Assessment  Patient: Lisa [name] is an 89 y.o., female MRN: [id number] DOB: 05/02/1934 Height: 5\' 3"  (160 cm) Weight: 75.4 kg  Insurance Information HMO:     PPO:      PCP:      IPA:      80/20:      OTHER:  PRIMARY: Medicare a and b      Policy#: [id number]      Subscriber: pt Benefits:  Phone #: passport one source online     Name: 7/25 Eff. Date: 03/02/2001     Deduct: $1632      Out of Pocket Max: none      Life Max: none CIR: 100%      SNF: 20 full days Outpatient: 80%     Co-Pay: 20% Home Health: 100%      Co-Pay: NONE DME: 80%     Co-Pay: 20% Providers: In network  SECONDARY: Tricare for Life      Policy#: [id number]  Financial Counselor:       Phone#:   The "Data Collection Information Summary" for patients in Inpatient Rehabilitation Facilities with attached "Privacy Act Statement-Health Care Records" was provided and verbally reviewed with: Family  Emergency Contact Information Contact Information     Name Relation Home Work Mobile   [place] [phone]     Tamashiro,[name] Daughter [phone]  [phone]   Mayo, Owczarzak   [phone]      Other Contacts   None on File    Current Medical History  Patient Admitting Diagnosis: Debility with COPD exacerbation, CAP and acute diastolic failure  History of Present Illness: 89 year old female with history of COPD, chronic hypoxic respiratory failure on 2 liters at baseline, CKD 3 b, HFpEF, paroxysmal Afib and MDS/CML with neutropenia and thrombocytopenia. Recent admission 6/24 for STEMI complicated by acute blood loss anemia and hyperkalemia. Presented on 05/20/23 with progressive weakness for 2 days. Decreased appetite, increased confusion, sleeping throughout the day, and found to had increased 02 requirements. Witnessed fall to the floor.  ABG'S worsened on admission and moved to ICU for BIPAP placement. Hyperkalemic without EKG changes treated with  Calcium gluconate, Lokelma and LR boluses. CXR c/w moderate perihilar and lower lobe interstitial pulmonary edema. BNP elevated. CT head negative for acute process. PCCM consulted as well as Nephrology.   Transferred to floor. Added IS and flutter valve. Ceftriaxone and Azithromycin began , now completed oral doxycycline. Treated with IV furosemide with good results . Now on home oral dosage. Pulmicort nebs. IV methylprednisolone completed and on oral prednisone now. Duo neb and continue supplemental baseline O2. Has diuresed over 4.1 liters since admission. On home dosage.   Hyperkalemia resolved with aggressive treatment with Lokelma, lasix and insulin with D 50. Nephrology felt to be chronic problem. Hyponatremia appeared to be acute on chronic problem secondary to poor po intake. Improved alongside lasix. Follow up with nephrology as an outpatient. Acute CHF exacerbation. Imdur daily, metoprolol and furosemide for treatment and also HTN. Out Patient follow up for Neutropenia for chronic Leukemia. Chronic thrombocytopenia. Protonix for GI protection given recent history of GI Bleed. Pravastatin at HS for CAD. Metoprolol for a fib. Brief chest pain episode 7/22. Cardiology plans for medical and symptom management. Levothyroxine for hypothyroidism.   Palliative consulted for GOC discussions.   Patient's medical record from [facility] has been reviewed by the rehabilitation admission coordinator and physician.  Past Medical History  Past Medical History:  Diagnosis Date   Adenocarcinoma  of left lung (HCC) 2006   Arthritis    Asthma    Cancer of breast, female [facility])    Cancer of lung (HCC)    Dysrhythmia    Hypertension    Hypothyroidism    Invasive ductal carcinoma of left breast (HCC) 1999   Myocardial infarction (HCC)    Neutropenia (HCC) 06/09/2016   Personal history of radiation therapy    Has the patient had major surgery during 100 days prior to admission? No  Family History   family  history includes Cancer in her mother and sister.  Current Medications  Current Facility-Administered Medications:    acetaminophen (TYLENOL) tablet 650 mg, 650 mg, Oral, Q6H PRN, 650 mg at 05/24/23 1909 **OR** acetaminophen (TYLENOL) suppository 650 mg, 650 mg, Rectal, Q6H PRN, Johnson, Clanford L, MD   alum & mag hydroxide-simeth (MAALOX/MYLANTA) 200-200-20 MG/5ML suspension 30 mL, 30 mL, Oral, Q4H PRN, Sherryll Burger, Pratik D, DO, 30 mL at 05/27/23 0020   bisacodyl (DULCOLAX) EC tablet 5 mg, 5 mg, Oral, Daily PRN, Laural Benes, Clanford L, MD   fludrocortisone (FLORINEF) tablet 0.1 mg, 0.1 mg, Oral, Daily, Annie Sable, MD, 0.1 mg at 05/27/23 1031   furosemide (LASIX) tablet 20 mg, 20 mg, Oral, Daily, Johnson, Clanford L, MD, 20 mg at 05/27/23 1031   ipratropium-albuterol (DUONEB) 0.5-2.5 (3) MG/3ML nebulizer solution 3 mL, 3 mL, Nebulization, Q2H PRN, Laural Benes, Clanford L, MD, 3 mL at 05/24/23 1952   isosorbide mononitrate (IMDUR) 24 hr tablet 60 mg, 60 mg, Oral, Daily, Jacolyn Reedy M, PA-C, 60 mg at 05/27/23 1031   levothyroxine (SYNTHROID) tablet 100 mcg, 100 mcg, Oral, QAC breakfast, Laural Benes, Clanford L, MD, 100 mcg at 05/27/23 0549   metoprolol succinate (TOPROL-XL) 24 hr tablet 25 mg, 25 mg, Oral, Daily, Johnson, Clanford L, MD, 25 mg at 05/27/23 1031   mirtazapine (REMERON) tablet 15 mg, 15 mg, Oral, QHS, Johnson, Clanford L, MD, 15 mg at 05/26/23 2204   ondansetron (ZOFRAN) tablet 4 mg, 4 mg, Oral, Q6H PRN **OR** ondansetron (ZOFRAN) injection 4 mg, 4 mg, Intravenous, Q6H PRN, Johnson, Clanford L, MD   Oral care mouth rinse, 15 mL, Mouth Rinse, 4 times per day, Johnson, Clanford L, MD, 15 mL at 05/26/23 2205   Oral care mouth rinse, 15 mL, Mouth Rinse, PRN, Johnson, Clanford L, MD   oxyCODONE (Oxy IR/ROXICODONE) immediate release tablet 5 mg, 5 mg, Oral, Q6H PRN, Johnson, Clanford L, MD   pantoprazole (PROTONIX) EC tablet 40 mg, 40 mg, Oral, QHS, Johnson, Clanford L, MD, 40 mg at 05/26/23  2204   pravastatin (PRAVACHOL) tablet 10 mg, 10 mg, Oral, QPM, Johnson, Clanford L, MD, 10 mg at 05/26/23 1702   predniSONE (DELTASONE) tablet 30 mg, 30 mg, Oral, Q breakfast, Johnson, Clanford L, MD, 30 mg at 05/27/23 1031  Patients Current Diet:  Diet Order             Diet regular Room service appropriate? No; Fluid consistency: Thin  Diet effective now                  Precautions / Restrictions Precautions Precautions: Fall Restrictions Weight Bearing Restrictions: No   Has the patient had 2 or more falls or a fall with injury in the past year? Yes  Prior Activity Level Limited Community (1-2x/wk): Mod I with RW or Cane, assisted with bathing  Prior Functional Level Self Care: Did the patient need help bathing, dressing, using the toilet or eating? Needed some help  Indoor Mobility:  Did the patient need assistance with walking from room to room (with or without device)? Independent  Stairs: Did the patient need assistance with internal or external stairs (with or without device)? Needed some help  Functional Cognition: Did the patient need help planning regular tasks such as shopping or remembering to take medications? Needed some help  Patient Information Are you of Hispanic, Latino/a,or Spanish origin?: A. No, not of Hispanic, Latino/a, or Spanish origin What is your race?: B. Black or African American Do you need or want an interpreter to communicate with a doctor or health care staff?: 0. No  Patient's Response To:  Health Literacy and Transportation Is the patient able to respond to health literacy and transportation needs?: Yes Health Literacy - How often do you need to have someone help you when you read instructions, pamphlets, or other written material from your doctor or pharmacy?: Sometimes In the past 12 months, has lack of transportation kept you from medical appointments or from getting medications?: No In the past 12 months, has lack of transportation  kept you from meetings, work, or from getting things needed for daily living?: No  Home Assistive Devices / Equipment Home Assistive Devices/Equipment: Environmental consultant (specify type) Home Equipment: Agricultural consultant (2 wheels), [employer] - quad, BSC/3in1, Grab bars - tub/shower, Wheelchair - manual, Other (comment) (lift chair)  Prior Device Use: Indicate devices/aids used by the patient prior to current illness, exacerbation or injury? Walker and cane  Current Functional Level Cognition  Overall Cognitive Status: Within Functional Limits for tasks assessed Orientation Level: Oriented X4    Extremity Assessment (includes Sensation/Coordination)  Upper Extremity Assessment: Generalized weakness  Lower Extremity Assessment: Defer to PT evaluation    ADLs  Overall ADL's : Needs assistance/impaired Eating/Feeding: Modified independent, Sitting Grooming: Modified independent, Sitting Upper Body Bathing: Set up, Sitting Lower Body Bathing: Moderate assistance, Sitting/lateral leans Upper Body Dressing : Set up, Sitting Lower Body Dressing: Set up, Sitting/lateral leans Lower Body Dressing Details (indicate cue type and reason): Pt able to doff and don socks seated in recliner. Toilet Transfer: Moderate assistance, Minimal assistance, Ambulation, Stand-pivot, Rolling walker (2 wheels) Toilet Transfer Details (indicate cue type and reason): Simulated via ambulation in room and step pivot to chair from the bed with RW. Toileting- Clothing Manipulation and Hygiene: Minimal assistance, Min guard, Sitting/lateral lean Tub/ Engineer, structural: Moderate assistance, Stand-pivot Functional mobility during ADLs: Minimal assistance, Moderate assistance, Rolling walker (2 wheels)    Mobility  Overal bed mobility: Modified Independent Bed Mobility: Supine to Sit Supine to sit: Modified independent (Device/Increase time) Sit to supine: Supervision General bed mobility comments: increased time and labored     Transfers  Overall transfer level: Modified independent Equipment used: Rolling walker (2 wheels) Transfers: Sit to/from Stand Sit to Stand: Min assist (cueing for hand placement) Bed to/from chair/wheelchair/BSC transfer type:: Step pivot Step pivot transfers: Modified independent (Device/Increase time) General transfer comment: use of RW during transfers, sit to stand required min assist with cueing for handplacement    Ambulation / Gait / Stairs / Wheelchair Mobility  Ambulation/Gait Ambulation/Gait assistance: Editor, commissioning (Feet): 20 Feet Assistive device: Rolling walker (2 wheels) Gait Pattern/deviations: Step-through pattern, Decreased step length - right, Decreased step length - left, Decreased stride length General Gait Details: use of RW, slightly labored Gait velocity: slowed    Posture / Balance Dynamic Sitting Balance Sitting balance - Comments: good balance seated EOB Balance Overall balance assessment: Needs assistance Sitting-balance support: Feet supported, Single extremity supported Sitting  balance-Leahy Scale: Good Sitting balance - Comments: good balance seated EOB Standing balance support: During functional activity, Bilateral upper extremity supported, Reliant on assistive device for balance Standing balance-Leahy Scale: Fair Standing balance comment: fair balance standing at bedside with use of RW    Special needs/care consideration Fall precautions   Previous Home Environment  Living Arrangements:  (lives with daughter, Steward Drone and son Alden Server)  Lives With: Son, Daughter Available Help at Discharge: Family, Available 24 hours/day Type of Home: House Home Layout: Two level, Able to live on main level with bedroom/bathroom Alternate Level Stairs-Rails: Left Alternate Level Stairs-Number of Steps: 14 Home Access: Stairs to enter, Other (comment) Entrance Stairs-Number of Steps: 4 Bathroom Shower/Tub: Engineer, manufacturing systems:  Handicapped height Bathroom Accessibility: Yes Home Care Services: No Additional Comments: Pt reports living with daughter . Pt on supplemental O2 at home. (per chart review. Uses lift chair.)  Discharge Living Setting Plans for Discharge Living Setting: Patient's home, Lives with (comment) (daughter and son) Type of Home at Discharge: House Discharge Home Layout: Two level, Able to live on main level with bedroom/bathroom Alternate Level Stairs-Rails: Left Alternate Level Stairs-Number of Steps: 14 Discharge Home Access: Stairs to enter Entrance Stairs-Number of Steps: 4 Discharge Bathroom Shower/Tub: Tub/shower unit Discharge Bathroom Toilet: Handicapped height Discharge Bathroom Accessibility: Yes How Accessible: Accessible via walker Does the patient have any problems obtaining your medications?: No  Social/Family/Support Systems Patient Roles: Parent Contact Information: grandson, Dr Georgiana Shore in [place] is POA, dtr, Steward Drone and son , Alden Server , are local caregivers Anticipated Caregiver: Steward Drone and Alden Server Anticipated Caregiver's Contact Information: see contacts Ability/Limitations of Caregiver: no limitations Caregiver Availability: 24/7 Discharge Plan Discussed with Primary Caregiver: Yes Is Caregiver In Agreement with Plan?: Yes Does Caregiver/Family have Issues with Lodging/Transportation while Pt is in Rehab?: No  Goals Patient/Family Goal for Rehab: Mod I to supervision with PT and OT Expected length of stay: ELOS 7 days Additional Information: Was at [facility] 12/21 Pt/Family Agrees to Admission and willing to participate: Yes Program Orientation Provided & Reviewed with Pt/Caregiver Including Roles  & Responsibilities: Yes  Decrease burden of Care through IP rehab admission: n/a  Possible need for SNF placement upon discharge: not anticipated  Patient Condition: I have reviewed medical records from [facility], spoken with CSW, and family member. I discussed via phone for  inpatient rehabilitation assessment.  Patient will benefit from ongoing PT and OT, can actively participate in 3 hours of therapy a day 5 days of the week, and can make measurable gains during the admission.  Patient will also benefit from the coordinated team approach during an Inpatient Acute Rehabilitation admission.  The patient will receive intensive therapy as well as Rehabilitation physician, nursing, social worker, and care management interventions.  Due to bladder management, bowel management, safety, skin/wound care, disease management, medication administration, pain management, and patient education the patient requires 24 hour a day rehabilitation nursing.  The patient is currently Min A with mobility and Mod A with basic ADLs.  Discharge setting and therapy post discharge at home with home health is anticipated.  Patient has agreed to participate in the Acute Inpatient Rehabilitation Program and will admit today.  Preadmission Screen Completed [id number] Boyette RN MSN, 05/27/2023 2:45 PM ______________________________________________________________________   Discussed status with Dr. Riley Kill on 05/28/23  at 10:12 AM and received approval for admission today.  Admission Coordinator: Ottie Glazier RN MSN, time 10:13 AM/Date 05/28/23    Assessment/Plan: Diagnosis: debility after multiple medical issues Does the need  for close, 24 hr/day Medical supervision in concert with the patient's rehab needs make it unreasonable for this patient to be served in a less intensive setting? Yes Co-Morbidities requiring supervision/potential complications: COPD, chronic hypoxic respiratory failure, MDS/CML, PAF Due to bladder management, bowel management, safety, skin/wound care, disease management, medication administration, pain management, and patient education, does the patient require 24 hr/day rehab nursing? Yes Does the patient require coordinated care of a physician, rehab nurse, PT, OT, and SLP  to address physical and functional deficits in the context of the above medical diagnosis(es)? Yes Addressing deficits in the following areas: balance, endurance, locomotion, strength, transferring, bowel/bladder control, bathing, dressing, feeding, grooming, toileting, and psychosocial support Can the patient actively participate in an intensive therapy program of at least 3 hrs of therapy 5 days a week? Yes The potential for patient to make measurable gains while on inpatient rehab is excellent Anticipated functional outcomes upon discharge from inpatient rehab: modified independent and supervision PT, modified independent and supervision OT, n/a SLP Estimated rehab length of stay to reach the above functional goals is: 7 days Anticipated discharge destination: Home 10. Overall Rehab/Functional Prognosis: excellent   MD Signature: Ranelle Oyster, MD, [facility] Physical Medicine & Rehabilitation Medical Director Rehabilitation Services 05/28/2023

## 2023-05-27 NOTE — Progress Notes (Signed)
Palliative: Chart review completed.   Lisa [name] goals are set for acute inpatient rehab with the ultimate goal of returning home where she has 24/7 caregivers. Palliative medicine team to sign off.  Conference with transition of care team.  No charge Lisa Carmel, NP Palliative medicine team Team phone [phone] Greater than 50% of the this time was spent counseling and coordinating care related to the above assessment and plan.

## 2023-05-28 ENCOUNTER — Encounter (HOSPITAL_COMMUNITY): Payer: Self-pay | Admitting: Physical Medicine and Rehabilitation

## 2023-05-28 ENCOUNTER — Inpatient Hospital Stay (HOSPITAL_COMMUNITY)
Admission: RE | Admit: 2023-05-28 | Discharge: 2023-06-11 | DRG: 945 | Disposition: A | Discharge status: Home / Self Care | Payer: Medicare Other | Source: Intra-hospital | Attending: Physical Medicine and Rehabilitation | Admitting: Physical Medicine and Rehabilitation

## 2023-05-28 ENCOUNTER — Other Ambulatory Visit: Payer: Self-pay

## 2023-05-28 DIAGNOSIS — E785 Hyperlipidemia, unspecified: Secondary | ICD-10-CM | POA: Diagnosis present

## 2023-05-28 DIAGNOSIS — J189 Pneumonia, unspecified organism: Secondary | ICD-10-CM | POA: Diagnosis not present

## 2023-05-28 DIAGNOSIS — Z886 Allergy status to analgesic agent status: Secondary | ICD-10-CM

## 2023-05-28 DIAGNOSIS — J9622 Acute and chronic respiratory failure with hypercapnia: Secondary | ICD-10-CM | POA: Diagnosis not present

## 2023-05-28 DIAGNOSIS — R5381 Other malaise: Secondary | ICD-10-CM | POA: Diagnosis not present

## 2023-05-28 DIAGNOSIS — I4819 Other persistent atrial fibrillation: Secondary | ICD-10-CM | POA: Diagnosis present

## 2023-05-28 DIAGNOSIS — Z8719 Personal history of other diseases of the digestive system: Secondary | ICD-10-CM

## 2023-05-28 DIAGNOSIS — R9389 Abnormal findings on diagnostic imaging of other specified body structures: Secondary | ICD-10-CM | POA: Diagnosis not present

## 2023-05-28 DIAGNOSIS — J9611 Chronic respiratory failure with hypoxia: Secondary | ICD-10-CM | POA: Diagnosis present

## 2023-05-28 DIAGNOSIS — E875 Hyperkalemia: Secondary | ICD-10-CM | POA: Diagnosis present

## 2023-05-28 DIAGNOSIS — Z87891 Personal history of nicotine dependence: Secondary | ICD-10-CM

## 2023-05-28 DIAGNOSIS — I130 Hypertensive heart and chronic kidney disease with heart failure and stage 1 through stage 4 chronic kidney disease, or unspecified chronic kidney disease: Secondary | ICD-10-CM | POA: Diagnosis present

## 2023-05-28 DIAGNOSIS — Z713 Dietary counseling and surveillance: Secondary | ICD-10-CM | POA: Diagnosis not present

## 2023-05-28 DIAGNOSIS — E612 Magnesium deficiency: Secondary | ICD-10-CM | POA: Diagnosis not present

## 2023-05-28 DIAGNOSIS — I5032 Chronic diastolic (congestive) heart failure: Secondary | ICD-10-CM | POA: Diagnosis present

## 2023-05-28 DIAGNOSIS — R350 Frequency of micturition: Secondary | ICD-10-CM | POA: Diagnosis not present

## 2023-05-28 DIAGNOSIS — Z7951 Long term (current) use of inhaled steroids: Secondary | ICD-10-CM

## 2023-05-28 DIAGNOSIS — J811 Chronic pulmonary edema: Secondary | ICD-10-CM | POA: Diagnosis present

## 2023-05-28 DIAGNOSIS — Z853 Personal history of malignant neoplasm of breast: Secondary | ICD-10-CM

## 2023-05-28 DIAGNOSIS — J9612 Chronic respiratory failure with hypercapnia: Secondary | ICD-10-CM | POA: Diagnosis present

## 2023-05-28 DIAGNOSIS — G479 Sleep disorder, unspecified: Secondary | ICD-10-CM | POA: Diagnosis present

## 2023-05-28 DIAGNOSIS — I5031 Acute diastolic (congestive) heart failure: Secondary | ICD-10-CM | POA: Diagnosis not present

## 2023-05-28 DIAGNOSIS — D469 Myelodysplastic syndrome, unspecified: Secondary | ICD-10-CM | POA: Diagnosis not present

## 2023-05-28 DIAGNOSIS — I214 Non-ST elevation (NSTEMI) myocardial infarction: Secondary | ICD-10-CM

## 2023-05-28 DIAGNOSIS — E876 Hypokalemia: Secondary | ICD-10-CM | POA: Diagnosis not present

## 2023-05-28 DIAGNOSIS — I252 Old myocardial infarction: Secondary | ICD-10-CM

## 2023-05-28 DIAGNOSIS — I517 Cardiomegaly: Secondary | ICD-10-CM | POA: Diagnosis not present

## 2023-05-28 DIAGNOSIS — J449 Chronic obstructive pulmonary disease, unspecified: Secondary | ICD-10-CM | POA: Diagnosis present

## 2023-05-28 DIAGNOSIS — E039 Hypothyroidism, unspecified: Secondary | ICD-10-CM | POA: Diagnosis present

## 2023-05-28 DIAGNOSIS — I480 Paroxysmal atrial fibrillation: Secondary | ICD-10-CM | POA: Diagnosis present

## 2023-05-28 DIAGNOSIS — E871 Hypo-osmolality and hyponatremia: Secondary | ICD-10-CM | POA: Diagnosis present

## 2023-05-28 DIAGNOSIS — Z923 Personal history of irradiation: Secondary | ICD-10-CM

## 2023-05-28 DIAGNOSIS — J9621 Acute and chronic respiratory failure with hypoxia: Secondary | ICD-10-CM

## 2023-05-28 DIAGNOSIS — R0989 Other specified symptoms and signs involving the circulatory and respiratory systems: Secondary | ICD-10-CM | POA: Diagnosis not present

## 2023-05-28 DIAGNOSIS — Z6828 Body mass index (BMI) 28.0-28.9, adult: Secondary | ICD-10-CM

## 2023-05-28 DIAGNOSIS — C9310 Chronic myelomonocytic leukemia not having achieved remission: Secondary | ICD-10-CM | POA: Diagnosis not present

## 2023-05-28 DIAGNOSIS — D709 Neutropenia, unspecified: Secondary | ICD-10-CM | POA: Diagnosis present

## 2023-05-28 DIAGNOSIS — D696 Thrombocytopenia, unspecified: Secondary | ICD-10-CM | POA: Diagnosis present

## 2023-05-28 DIAGNOSIS — N1832 Chronic kidney disease, stage 3b: Secondary | ICD-10-CM | POA: Diagnosis present

## 2023-05-28 DIAGNOSIS — I959 Hypotension, unspecified: Secondary | ICD-10-CM | POA: Diagnosis not present

## 2023-05-28 DIAGNOSIS — I110 Hypertensive heart disease with heart failure: Secondary | ICD-10-CM | POA: Diagnosis present

## 2023-05-28 DIAGNOSIS — J441 Chronic obstructive pulmonary disease with (acute) exacerbation: Secondary | ICD-10-CM

## 2023-05-28 DIAGNOSIS — E663 Overweight: Secondary | ICD-10-CM | POA: Diagnosis present

## 2023-05-28 DIAGNOSIS — K5900 Constipation, unspecified: Secondary | ICD-10-CM | POA: Diagnosis not present

## 2023-05-28 DIAGNOSIS — Z9981 Dependence on supplemental oxygen: Secondary | ICD-10-CM

## 2023-05-28 DIAGNOSIS — Z902 Acquired absence of lung [part of]: Secondary | ICD-10-CM

## 2023-05-28 DIAGNOSIS — G4700 Insomnia, unspecified: Secondary | ICD-10-CM | POA: Diagnosis present

## 2023-05-28 DIAGNOSIS — Z79890 Hormone replacement therapy (postmenopausal): Secondary | ICD-10-CM

## 2023-05-28 DIAGNOSIS — R918 Other nonspecific abnormal finding of lung field: Secondary | ICD-10-CM | POA: Diagnosis not present

## 2023-05-28 DIAGNOSIS — M109 Gout, unspecified: Secondary | ICD-10-CM | POA: Diagnosis present

## 2023-05-28 DIAGNOSIS — Z7952 Long term (current) use of systemic steroids: Secondary | ICD-10-CM

## 2023-05-28 DIAGNOSIS — R000 Tachycardia, unspecified: Secondary | ICD-10-CM | POA: Diagnosis present

## 2023-05-28 DIAGNOSIS — I350 Nonrheumatic aortic (valve) stenosis: Secondary | ICD-10-CM | POA: Diagnosis not present

## 2023-05-28 DIAGNOSIS — Z8701 Personal history of pneumonia (recurrent): Secondary | ICD-10-CM

## 2023-05-28 DIAGNOSIS — Z888 Allergy status to other drugs, medicaments and biological substances status: Secondary | ICD-10-CM

## 2023-05-28 DIAGNOSIS — Z85118 Personal history of other malignant neoplasm of bronchus and lung: Secondary | ICD-10-CM

## 2023-05-28 DIAGNOSIS — Z79899 Other long term (current) drug therapy: Secondary | ICD-10-CM

## 2023-05-28 HISTORY — DX: Paroxysmal atrial fibrillation: I48.0

## 2023-05-28 LAB — BASIC METABOLIC PANEL: CO2: 30 mmol/L (ref 22–32)

## 2023-05-28 MED ORDER — ALUM & MAG HYDROXIDE-SIMETH 200-200-20 MG/5ML PO SUSP: 30.0000 mL | ORAL | Status: DC | PRN

## 2023-05-28 MED ORDER — MIRTAZAPINE 15 MG PO TABS
15.0000 mg | ORAL_TABLET | Freq: Every day | ORAL | Status: DC
  Administered 2023-05-28 – 2023-06-10 (×14): 15 mg via ORAL
  Filled 2023-05-28 (×14): qty 1

## 2023-05-28 MED ORDER — PANTOPRAZOLE SODIUM 40 MG PO TBEC
40.0000 mg | DELAYED_RELEASE_TABLET | Freq: Two times a day (BID) | ORAL | Status: DC
  Administered 2023-05-28 – 2023-06-11 (×28): 40 mg via ORAL
  Filled 2023-05-28 (×28): qty 1

## 2023-05-28 MED ORDER — FLUDROCORTISONE ACETATE 0.1 MG PO TABS: 0.1000 mg | ORAL_TABLET | Freq: Every day | ORAL | 0 refills | Status: DC

## 2023-05-28 MED ORDER — METOPROLOL SUCCINATE ER 25 MG PO TB24
25.0000 mg | ORAL_TABLET | Freq: Every day | ORAL | Status: DC
  Administered 2023-05-29 – 2023-06-11 (×14): 25 mg via ORAL
  Filled 2023-05-28 (×14): qty 1

## 2023-05-28 MED ORDER — PREDNISONE 5 MG PO TABS
30.0000 mg | ORAL_TABLET | Freq: Every day | ORAL | Status: DC
  Administered 2023-05-29 – 2023-06-09 (×12): 30 mg via ORAL
  Filled 2023-05-28 (×12): qty 2

## 2023-05-28 MED ORDER — LEVOTHYROXINE SODIUM 100 MCG PO TABS
100.0000 ug | ORAL_TABLET | Freq: Every day | ORAL | Status: DC
  Administered 2023-05-29 – 2023-06-11 (×14): 100 ug via ORAL
  Filled 2023-05-28 (×14): qty 1

## 2023-05-28 MED ORDER — FUROSEMIDE 20 MG PO TABS
20.0000 mg | ORAL_TABLET | Freq: Every day | ORAL | Status: DC
  Administered 2023-05-29 – 2023-05-31 (×3): 20 mg via ORAL
  Filled 2023-05-28 (×3): qty 1

## 2023-05-28 MED ORDER — ACETAMINOPHEN 650 MG RE SUPP: 650.0000 mg | Freq: Four times a day (QID) | RECTAL | Status: DC | PRN

## 2023-05-28 MED ORDER — OXYCODONE HCL 5 MG PO TABS: 5.0000 mg | ORAL_TABLET | Freq: Four times a day (QID) | ORAL | Status: DC | PRN

## 2023-05-28 MED ORDER — ONDANSETRON HCL 4 MG/2ML IJ SOLN: 4.0000 mg | Freq: Four times a day (QID) | INTRAMUSCULAR | Status: DC | PRN

## 2023-05-28 MED ORDER — PRAVASTATIN SODIUM 10 MG PO TABS
10.0000 mg | ORAL_TABLET | Freq: Every evening | ORAL | Status: DC
  Administered 2023-05-28 – 2023-06-10 (×14): 10 mg via ORAL
  Filled 2023-05-28 (×15): qty 1

## 2023-05-28 MED ORDER — ACETAMINOPHEN 325 MG PO TABS
650.0000 mg | ORAL_TABLET | Freq: Four times a day (QID) | ORAL | Status: DC | PRN
  Administered 2023-06-07 – 2023-06-10 (×4): 650 mg via ORAL
  Filled 2023-05-28 (×4): qty 2

## 2023-05-28 MED ORDER — FLUDROCORTISONE ACETATE 0.1 MG PO TABS
0.1000 mg | ORAL_TABLET | Freq: Every day | ORAL | Status: DC
  Administered 2023-05-29 – 2023-06-09 (×12): 0.1 mg via ORAL
  Filled 2023-05-28 (×12): qty 1

## 2023-05-28 MED ORDER — IPRATROPIUM-ALBUTEROL 0.5-2.5 (3) MG/3ML IN SOLN: 3.0000 mL | RESPIRATORY_TRACT | Status: DC | PRN

## 2023-05-28 MED ORDER — BISACODYL 5 MG PO TBEC: 5.0000 mg | DELAYED_RELEASE_TABLET | Freq: Every day | ORAL | Status: DC | PRN

## 2023-05-28 MED ORDER — ISOSORBIDE MONONITRATE ER 30 MG PO TB24
60.0000 mg | ORAL_TABLET | Freq: Every day | ORAL | Status: DC
  Administered 2023-05-29 – 2023-06-11 (×14): 60 mg via ORAL
  Filled 2023-05-28 (×14): qty 2

## 2023-05-28 MED ORDER — ONDANSETRON HCL 4 MG PO TABS: 4.0000 mg | ORAL_TABLET | Freq: Four times a day (QID) | ORAL | Status: DC | PRN

## 2023-05-28 MED ORDER — MELATONIN 3 MG PO TABS
3.0000 mg | ORAL_TABLET | Freq: Every day | ORAL | Status: DC
  Administered 2023-05-28 – 2023-06-10 (×14): 3 mg via ORAL
  Filled 2023-05-28 (×14): qty 1

## 2023-05-28 NOTE — Progress Notes (Signed)
Signed     Expand All Collapse All PMR Admission Coordinator Pre-Admission Assessment   Patient: Lisa [name] is an 89 y.o., female MRN: [id number] DOB: May 21, 1934 Height: 5\' 3"  (160 cm) Weight: 75.4 kg   Insurance Information HMO:     PPO:      PCP:      IPA:      80/20:      OTHER:  PRIMARY: Medicare a and b      Policy#: [id number]      Subscriber: pt Benefits:  Phone #: passport one source online     Name: 7/25 Eff. Date: 03/02/2001     Deduct: $1632      Out of Pocket Max: none      Life Max: none CIR: 100%      SNF: 20 full days Outpatient: 80%     Co-Pay: 20% Home Health: 100%      Co-Pay: NONE DME: 80%     Co-Pay: 20% Providers: In network  SECONDARY: Tricare for Life      Policy#: [id number]   Financial Counselor:       Phone#:    The "Data Collection Information Summary" for patients in Inpatient Rehabilitation Facilities with attached "Privacy Act Statement-Health Care Records" was provided and verbally reviewed with: Family   Emergency Contact Information Contact Information       Name Relation Home Work Mobile    [place] [phone]        Lodes,[name] Daughter [phone]   [phone]    Laretha, Cornacchia     [phone]         Other Contacts   None on File      Current Medical History  Patient Admitting Diagnosis: Debility with COPD exacerbation, CAP and acute diastolic failure   History of Present Illness: 89 year old female with history of COPD, chronic hypoxic respiratory failure on 2 liters at baseline, CKD 3 b, HFpEF, paroxysmal Afib and MDS/CML with neutropenia and thrombocytopenia. Recent admission 6/24 for STEMI complicated by acute blood loss anemia and hyperkalemia. Presented on 05/20/23 with progressive weakness for 2 days. Decreased appetite, increased confusion, sleeping throughout the day, and found to had increased 02 requirements. Witnessed fall to the floor.   ABG'S worsened on admission and moved to ICU for BIPAP  placement. Hyperkalemic without EKG changes treated with Calcium gluconate, Lokelma and LR boluses. CXR c/w moderate perihilar and lower lobe interstitial pulmonary edema. BNP elevated. CT head negative for acute process. PCCM consulted as well as Nephrology.    Transferred to floor. Added IS and flutter valve. Ceftriaxone and Azithromycin began , now completed oral doxycycline. Treated with IV furosemide with good results . Now on home oral dosage. Pulmicort nebs. IV methylprednisolone completed and on oral prednisone now. Duo neb and continue supplemental baseline O2. Has diuresed over 4.1 liters since admission. On home dosage.    Hyperkalemia resolved with aggressive treatment with Lokelma, lasix and insulin with D 50. Nephrology felt to be chronic problem. Hyponatremia appeared to be acute on chronic problem secondary to poor po intake. Improved alongside lasix. Follow up with nephrology as an outpatient. Acute CHF exacerbation. Imdur daily, metoprolol and furosemide for treatment and also HTN. Out Patient follow up for Neutropenia for chronic Leukemia. Chronic thrombocytopenia. Protonix for GI protection given recent history of GI Bleed. Pravastatin at HS for CAD. Metoprolol for a fib. Brief chest pain episode 7/22. Cardiology plans for medical and symptom management. Levothyroxine for hypothyroidism.    Palliative consulted  for GOC discussions.    Patient's medical record from [facility] has been reviewed by the rehabilitation admission coordinator and physician.   Past Medical History      Past Medical History:  Diagnosis Date   Adenocarcinoma of left lung (HCC) 2006   Arthritis     Asthma     Cancer of breast, female (HCC)     Cancer of lung (HCC)     Dysrhythmia     Hypertension     Hypothyroidism     Invasive ductal carcinoma of left breast (HCC) 1999   Myocardial infarction (HCC)     Neutropenia (HCC) 06/09/2016   Personal history of radiation therapy          Has the patient had  major surgery during 100 days prior to admission? No   Family History   family history includes Cancer in her mother and sister.   Current Medications  Current Medications    Current Facility-Administered Medications:    acetaminophen (TYLENOL) tablet 650 mg, 650 mg, Oral, Q6H PRN, 650 mg at 05/24/23 1909 **OR** acetaminophen (TYLENOL) suppository 650 mg, 650 mg, Rectal, Q6H PRN, Johnson, Clanford L, MD   alum & mag hydroxide-simeth (MAALOX/MYLANTA) 200-200-20 MG/5ML suspension 30 mL, 30 mL, Oral, Q4H PRN, Sherryll Burger, Pratik D, DO, 30 mL at 05/27/23 0020   bisacodyl (DULCOLAX) EC tablet 5 mg, 5 mg, Oral, Daily PRN, Laural Benes, Clanford L, MD   fludrocortisone (FLORINEF) tablet 0.1 mg, 0.1 mg, Oral, Daily, Annie Sable, MD, 0.1 mg at 05/27/23 1031   furosemide (LASIX) tablet 20 mg, 20 mg, Oral, Daily, Johnson, Clanford L, MD, 20 mg at 05/27/23 1031   ipratropium-albuterol (DUONEB) 0.5-2.5 (3) MG/3ML nebulizer solution 3 mL, 3 mL, Nebulization, Q2H PRN, Laural Benes, Clanford L, MD, 3 mL at 05/24/23 1952   isosorbide mononitrate (IMDUR) 24 hr tablet 60 mg, 60 mg, Oral, Daily, Jacolyn Reedy M, PA-C, 60 mg at 05/27/23 1031   levothyroxine (SYNTHROID) tablet 100 mcg, 100 mcg, Oral, QAC breakfast, Laural Benes, Clanford L, MD, 100 mcg at 05/27/23 0549   metoprolol succinate (TOPROL-XL) 24 hr tablet 25 mg, 25 mg, Oral, Daily, Johnson, Clanford L, MD, 25 mg at 05/27/23 1031   mirtazapine (REMERON) tablet 15 mg, 15 mg, Oral, QHS, Johnson, Clanford L, MD, 15 mg at 05/26/23 2204   ondansetron (ZOFRAN) tablet 4 mg, 4 mg, Oral, Q6H PRN **OR** ondansetron (ZOFRAN) injection 4 mg, 4 mg, Intravenous, Q6H PRN, Johnson, Clanford L, MD   Oral care mouth rinse, 15 mL, Mouth Rinse, 4 times per day, Johnson, Clanford L, MD, 15 mL at 05/26/23 2205   Oral care mouth rinse, 15 mL, Mouth Rinse, PRN, Johnson, Clanford L, MD   oxyCODONE (Oxy IR/ROXICODONE) immediate release tablet 5 mg, 5 mg, Oral, Q6H PRN, Johnson, Clanford L,  MD   pantoprazole (PROTONIX) EC tablet 40 mg, 40 mg, Oral, QHS, Johnson, Clanford L, MD, 40 mg at 05/26/23 2204   pravastatin (PRAVACHOL) tablet 10 mg, 10 mg, Oral, QPM, Johnson, Clanford L, MD, 10 mg at 05/26/23 1702   predniSONE (DELTASONE) tablet 30 mg, 30 mg, Oral, Q breakfast, Johnson, Clanford L, MD, 30 mg at 05/27/23 1031     Patients Current Diet:  Diet Order                  Diet regular Room service appropriate? No; Fluid consistency: Thin  Diet effective now  Precautions / Restrictions Precautions Precautions: Fall Restrictions Weight Bearing Restrictions: No    Has the patient had 2 or more falls or a fall with injury in the past year? Yes   Prior Activity Level Limited Community (1-2x/wk): Mod I with RW or Cane, assisted with bathing   Prior Functional Level Self Care: Did the patient need help bathing, dressing, using the toilet or eating? Needed some help   Indoor Mobility: Did the patient need assistance with walking from room to room (with or without device)? Independent   Stairs: Did the patient need assistance with internal or external stairs (with or without device)? Needed some help   Functional Cognition: Did the patient need help planning regular tasks such as shopping or remembering to take medications? Needed some help   Patient Information Are you of Hispanic, Latino/a,or Spanish origin?: A. No, not of Hispanic, Latino/a, or Spanish origin What is your race?: B. Black or African American Do you need or want an interpreter to communicate with a doctor or health care staff?: 0. No   Patient's Response To:  Health Literacy and Transportation Is the patient able to respond to health literacy and transportation needs?: Yes Health Literacy - How often do you need to have someone help you when you read instructions, pamphlets, or other written material from your doctor or pharmacy?: Sometimes In the past 12 months, has lack of  transportation kept you from medical appointments or from getting medications?: No In the past 12 months, has lack of transportation kept you from meetings, work, or from getting things needed for daily living?: No   Home Assistive Devices / Equipment Home Assistive Devices/Equipment: Environmental consultant (specify type) Home Equipment: Agricultural consultant (2 wheels), [employer] - quad, BSC/3in1, Grab bars - tub/shower, Wheelchair - manual, Other (comment) (lift chair)   Prior Device Use: Indicate devices/aids used by the patient prior to current illness, exacerbation or injury? Walker and cane   Current Functional Level Cognition   Overall Cognitive Status: Within Functional Limits for tasks assessed Orientation Level: Oriented X4    Extremity Assessment (includes Sensation/Coordination)   Upper Extremity Assessment: Generalized weakness  Lower Extremity Assessment: Defer to PT evaluation     ADLs   Overall ADL's : Needs assistance/impaired Eating/Feeding: Modified independent, Sitting Grooming: Modified independent, Sitting Upper Body Bathing: Set up, Sitting Lower Body Bathing: Moderate assistance, Sitting/lateral leans Upper Body Dressing : Set up, Sitting Lower Body Dressing: Set up, Sitting/lateral leans Lower Body Dressing Details (indicate cue type and reason): Pt able to doff and don socks seated in recliner. Toilet Transfer: Moderate assistance, Minimal assistance, Ambulation, Stand-pivot, Rolling walker (2 wheels) Toilet Transfer Details (indicate cue type and reason): Simulated via ambulation in room and step pivot to chair from the bed with RW. Toileting- Clothing Manipulation and Hygiene: Minimal assistance, Min guard, Sitting/lateral lean Tub/ Engineer, structural: Moderate assistance, Stand-pivot Functional mobility during ADLs: Minimal assistance, Moderate assistance, Rolling walker (2 wheels)     Mobility   Overal bed mobility: Modified Independent Bed Mobility: Supine to Sit Supine to sit:  Modified independent (Device/Increase time) Sit to supine: Supervision General bed mobility comments: increased time and labored     Transfers   Overall transfer level: Modified independent Equipment used: Rolling walker (2 wheels) Transfers: Sit to/from Stand Sit to Stand: Min assist (cueing for hand placement) Bed to/from chair/wheelchair/BSC transfer type:: Step pivot Step pivot transfers: Modified independent (Device/Increase time) General transfer comment: use of RW during transfers, sit to stand required min assist with  cueing for handplacement     Ambulation / Gait / Stairs / Wheelchair Mobility   Ambulation/Gait Ambulation/Gait assistance: Editor, commissioning (Feet): 20 Feet Assistive device: Rolling walker (2 wheels) Gait Pattern/deviations: Step-through pattern, Decreased step length - right, Decreased step length - left, Decreased stride length General Gait Details: use of RW, slightly labored Gait velocity: slowed     Posture / Balance Dynamic Sitting Balance Sitting balance - Comments: good balance seated EOB Balance Overall balance assessment: Needs assistance Sitting-balance support: Feet supported, Single extremity supported Sitting balance-Leahy Scale: Good Sitting balance - Comments: good balance seated EOB Standing balance support: During functional activity, Bilateral upper extremity supported, Reliant on assistive device for balance Standing balance-Leahy Scale: Fair Standing balance comment: fair balance standing at bedside with use of RW     Special needs/care consideration Fall precautions    Previous Home Environment  Living Arrangements:  (lives with daughter, Steward Drone and son Alden Server)  Lives With: Son, Daughter Available Help at Discharge: Family, Available 24 hours/day Type of Home: House Home Layout: Two level, Able to live on main level with bedroom/bathroom Alternate Level Stairs-Rails: Left Alternate Level Stairs-Number of Steps: 14 Home  Access: Stairs to enter, Other (comment) Entrance Stairs-Number of Steps: 4 Bathroom Shower/Tub: Engineer, manufacturing systems: Handicapped height Bathroom Accessibility: Yes Home Care Services: No Additional Comments: Pt reports living with daughter . Pt on supplemental O2 at home. (per chart review. Uses lift chair.)   Discharge Living Setting Plans for Discharge Living Setting: Patient's home, Lives with (comment) (daughter and son) Type of Home at Discharge: House Discharge Home Layout: Two level, Able to live on main level with bedroom/bathroom Alternate Level Stairs-Rails: Left Alternate Level Stairs-Number of Steps: 14 Discharge Home Access: Stairs to enter Entrance Stairs-Number of Steps: 4 Discharge Bathroom Shower/Tub: Tub/shower unit Discharge Bathroom Toilet: Handicapped height Discharge Bathroom Accessibility: Yes How Accessible: Accessible via walker Does the patient have any problems obtaining your medications?: No   Social/Family/Support Systems Patient Roles: Parent Contact Information: grandson, Dr Georgiana Shore in [place] is POA, dtr, Steward Drone and son , Alden Server , are local caregivers Anticipated Caregiver: Steward Drone and Alden Server Anticipated Caregiver's Contact Information: see contacts Ability/Limitations of Caregiver: no limitations Caregiver Availability: 24/7 Discharge Plan Discussed with Primary Caregiver: Yes Is Caregiver In Agreement with Plan?: Yes Does Caregiver/Family have Issues with Lodging/Transportation while Pt is in Rehab?: No   Goals Patient/Family Goal for Rehab: Mod I to supervision with PT and OT Expected length of stay: ELOS 7 days Additional Information: Was at [facility] 12/21 Pt/Family Agrees to Admission and willing to participate: Yes Program Orientation Provided & Reviewed with Pt/Caregiver Including Roles  & Responsibilities: Yes   Decrease burden of Care through IP rehab admission: n/a   Possible need for SNF placement upon discharge: not  anticipated   Patient Condition: I have reviewed medical records from [facility], spoken with CSW, and family member. I discussed via phone for inpatient rehabilitation assessment.  Patient will benefit from ongoing PT and OT, can actively participate in 3 hours of therapy a day 5 days of the week, and can make measurable gains during the admission.  Patient will also benefit from the coordinated team approach during an Inpatient Acute Rehabilitation admission.  The patient will receive intensive therapy as well as Rehabilitation physician, nursing, social worker, and care management interventions.  Due to bladder management, bowel management, safety, skin/wound care, disease management, medication administration, pain management, and patient education the patient requires 24 hour a day rehabilitation  nursing.  The patient is currently Min A with mobility and Mod A with basic ADLs.  Discharge setting and therapy post discharge at home with home health is anticipated.  Patient has agreed to participate in the Acute Inpatient Rehabilitation Program and will admit today.   Preadmission Screen Completed [id number] Boyette RN MSN, 05/27/2023 2:45 PM ______________________________________________________________________   Discussed status with Dr. Riley Kill on 05/28/23  at 10:12 AM and received approval for admission today.   Admission Coordinator: Ottie Glazier RN MSN, time 10:13 AM/Date 05/28/23     Assessment/Plan: Diagnosis: debility after multiple medical issues Does the need for close, 24 hr/day Medical supervision in concert with the patient's rehab needs make it unreasonable for this patient to be served in a less intensive setting? Yes Co-Morbidities requiring supervision/potential complications: COPD, chronic hypoxic respiratory failure, MDS/CML, PAF Due to bladder management, bowel management, safety, skin/wound care, disease management, medication administration, pain management, and patient education,  does the patient require 24 hr/day rehab nursing? Yes Does the patient require coordinated care of a physician, rehab nurse, PT, OT, and SLP to address physical and functional deficits in the context of the above medical diagnosis(es)? Yes Addressing deficits in the following areas: balance, endurance, locomotion, strength, transferring, bowel/bladder control, bathing, dressing, feeding, grooming, toileting, and psychosocial support Can the patient actively participate in an intensive therapy program of at least 3 hrs of therapy 5 days a week? Yes The potential for patient to make measurable gains while on inpatient rehab is excellent Anticipated functional outcomes upon discharge from inpatient rehab: modified independent and supervision PT, modified independent and supervision OT, n/a SLP Estimated rehab length of stay to reach the above functional goals is: 7 days Anticipated discharge destination: Home 10. Overall Rehab/Functional Prognosis: excellent     MD Signature: Ranelle Oyster, MD, [facility] Physical Medicine & Rehabilitation Medical Director Rehabilitation Services 05/28/2023

## 2023-05-28 NOTE — TOC Transition Note (Signed)
Transition of Care [facility]) - CM/SW Discharge Note   Patient Details  Name: Lisa [name] MRN: [id number] Date of Birth: 03/27/1934  Transition of [facility]) CM/SW Contact:  Villa Herb, LCSWA Phone Number: 05/28/2023, 10:34 AM   Clinical Narrative:    CIR updated CSW and MD that there is a bed available for pt today. CIR has set up [facility] transport. CSW added RN to chat to make aware of time of transport. TOC signing off.   Final next level of care: IP Rehab Facility Barriers to Discharge: Barriers Resolved   Patient Goals and CMS Choice CMS Medicare.gov Compare Post Acute Care list provided to:: Patient Represenative (must comment) Choice offered to / list presented to : Adult Children  Discharge Placement                         Discharge Plan and Services Additional resources added to the After Visit Summary for   In-house Referral: Clinical Social Work   Post Acute Care Choice: Skilled Nursing Facility                               Social Determinants of Health (SDOH) Interventions SDOH Screenings   Food Insecurity: No Food Insecurity (05/20/2023)  Housing: Low Risk  (05/20/2023)  Transportation Needs: No Transportation Needs (05/21/2023)  Utilities: Not At Risk (05/20/2023)  Depression (PHQ2-9): Low Risk  (12/12/2020)  Physical Activity: Inactive (12/12/2020)  Tobacco Use: Medium Risk (05/24/2023)     Readmission Risk Interventions    05/24/2023    2:10 PM 08/28/2021    1:06 PM  Readmission Risk Prevention Plan  Transportation Screening Complete Complete  HRI or Home Care Consult Complete Complete  Social Work Consult for Recovery Care Planning/Counseling Complete Complete  Palliative Care Screening Complete Not Applicable  Medication Review Oceanographer) Complete Complete

## 2023-05-28 NOTE — H&P (Signed)
Physical Medicine and Rehabilitation Admission H&P        Chief Complaint  Patient presents with   Altered Mental Status  : HPI: Lisa [name] is a 89 year old right-handed female and well-known to CIR from admission 10/30/2020 - 11/13/2020 for debility secondary to AKI superimposed on CKD stage III/hyponatremia/atrial fibrillation and was discharged to home ambulating 80 feet contact-guard assist.  Patient also with history of of breast cancer in 1999 treated with lumpectomy and radiation therapy as well as tamoxifen for 5 years, lung cancer with partial upper lobe lobectomy 2006, pancytopenia followed by hematology services Dr.Katragadda In the past, hypothyroidism, COPD/respiratory failure quit smoking 29 years ago was using 2 L oxygen at baseline, gout.  Patient with recent admission 6/4 - 6/7 for non-STEMI course complicated by acute blood loss anemia discharged with PPI twice daily per GI and hyperkalemia discharged with every other day Lokelma.  Per chart review patient lives with her daughter.  Two-level home but patient stays on main level with 4 steps to entry of home.  Her grandson is POA-Dr. Dorann Lodge.  Presented to [facility]/20/2023 with generalized weakness x 2 days as well as endorsing decreased appetite, increased confusion and ultimately found to have increased O2 requirements after family checked oxygen saturations in the 70s.  She did have a witnessed fall at home prior to admission denying that she struck her head.  On presentation advance 6 L to maintain oxygen saturations greater than 90% ABG showed 7.2 3/65/46/26.  Her potassium was elevated 6.5 without EKG changes given calcium gluconate, Lokelma 5 g and 2 x 1L LR boluses.  Chest x-ray showed moderate perihilar and lower lobe interstitial pulmonary edema.  BNP elevated at 1095 with normal lactate.  Also found to have stable and chronic neutropenia with ANC 200, and stable on chronic thrombocytopenia platelets  101,000.  She did have a low-grade fever of 101.4.  Cranial CT scan showed no acute intracranial changes.  Over the course of that afternoon and night of 7/18 consulted PCCM for BiPAP.  Continued to have management of potassium with Lasix and Lokelma.  Ceftriaxone azithromycin initiated 7/19 after initially being placed on doxycycline. Antibiotic course has since been completed.  Patient required intravenous methylprednisolone course completed started on oral prednisone 30 mg daily.  Patient with chronic hyponatremia with a goal sodium of 128-130.  Urine sodium 75 with 275 urine osmolality not appearing as SIADH, nor adrenal insufficiency giving morning cortisol within normal limits  and latest sodium level of 136.  Patient had brief chest pain episode 7/22 workup showed peak at bedtime troponin 318, 297, 299 cardiology follow-up plan medical management.  Echocardiogram with ejection fraction of 60 to 65% no wall motion abnormalities.  Patient not a candidate for anticoagulation due to high bleeding risk.  Palliative care was consulted to establish goals of care.  Therapy evaluations completed due to patient decreased functional mobility and weakness was admitted for a comprehensive rehab program.   Review of Systems  Constitutional:  Positive for fever and malaise/fatigue.  HENT:  Negative for hearing loss.   Eyes:  Negative for blurred vision and double vision.  Respiratory:  Positive for shortness of breath. Negative for cough and wheezing.   Cardiovascular:  Positive for palpitations and leg swelling.  Gastrointestinal:  Positive for constipation and nausea. Negative for vomiting.  Genitourinary:  Negative for dysuria, flank pain and hematuria.  Musculoskeletal:  Positive for falls, joint pain and myalgias.  Skin:  Negative for rash.  Neurological:  Positive for dizziness, weakness and headaches.  All other systems reviewed and are negative.       Past Medical History:  Diagnosis Date    Adenocarcinoma of left lung (HCC) 2006   Arthritis     Asthma     Cancer of breast, female (HCC)     Cancer of lung (HCC)     Dysrhythmia     Hypertension     Hypothyroidism     Invasive ductal carcinoma of left breast (HCC) 1999   Myocardial infarction (HCC)     Neutropenia (HCC) 06/09/2016   Personal history of radiation therapy               Past Surgical History:  Procedure Laterality Date   BIOPSY   10/23/2020    Procedure: BIOPSY;  Surgeon: Benancio Deeds, MD;  Location: [facility] ENDOSCOPY;  Service: Gastroenterology;;   BREAST BIOPSY        left axillary node dissection   BREAST LUMPECTOMY Left     COLONOSCOPY   03/08/2003    MWU:[id number] rectal polyps destroyed with the tip of the snare/Polyps at hepatic flexure, splenic flexure at 35 cm/Left-sided diverticula: unable to retrieve path   COLONOSCOPY   09/12/2008    OZD:GUYQIH rectum and distal sigmoid diminutive polyps/scattered left sided diverticulum. hyperplastic   COLONOSCOPY N/A 03/06/2013    KVQ:QVZDGLO polyp-removed as described above; colonic diverticulosis. hyperplastic polyps. next TCS 03/2018   ESOPHAGOGASTRODUODENOSCOPY (EGD) WITH PROPOFOL N/A 10/23/2020    Procedure: ESOPHAGOGASTRODUODENOSCOPY (EGD) WITH PROPOFOL;  Surgeon: Benancio Deeds, MD;  Location: [facility] ENDOSCOPY;  Service: Gastroenterology;  Laterality: N/A;   FOOT SURGERY       LUNG REMOVAL, PARTIAL        upper lobe             Family History  Problem Relation Age of Onset   Cancer Mother     Cancer Sister     Colon cancer Neg Hx          Social History:  reports that she quit smoking about 31 years ago. Her smoking use included cigarettes. She started smoking about 66 years ago. She has a 26.3 pack-year smoking history. She has never used smokeless tobacco. She reports that she does not drink alcohol and does not use drugs. Allergies:  Allergies       Allergies  Allergen Reactions   Meloxicam Other (See Comments)      Caused an  injury to the kidneys, per nephrologist   Micardis Hct [Telmisartan-Hctz] Other (See Comments)      Caused an injury to the kidneys, per nephrologist            Medications Prior to Admission  Medication Sig Dispense Refill   acetaminophen (TYLENOL) 325 MG tablet Take 2 tablets (650 mg total) by mouth every 6 (six) hours.       albuterol (VENTOLIN HFA) 108 (90 Base) MCG/ACT inhaler Inhale 1-2 puffs into the lungs every 4 (four) hours as needed for wheezing or shortness of breath.       Fluticasone-Umeclidin-Vilant (TRELEGY ELLIPTA) 100-62.5-25 MCG/ACT AEPB Inhale 1 puff into the lungs daily. 28 each 0   furosemide (LASIX) 20 MG tablet Take 1 tablet (20 mg total) by mouth daily. 30 tablet 1   gabapentin (NEURONTIN) 100 MG capsule Take 1 capsule (100 mg total) by mouth every 12 (twelve) hours as needed (neuropathy). (Patient taking differently: Take 100 mg by mouth 3 (three) times daily as needed (  neuropathy).) 60 capsule 1   isosorbide mononitrate (IMDUR) 30 MG 24 hr tablet Take 1 tablet (30 mg total) by mouth daily. 30 tablet 2   levothyroxine (SYNTHROID) 100 MCG tablet Take 1 tablet (100 mcg total) by mouth daily before breakfast. 30 tablet 1   LOKELMA 10 g PACK packet Take 10 g by mouth every other day.       magnesium oxide (MAG-OX) 400 MG tablet Take 400 mg by mouth 2 (two) times daily.       metoprolol succinate (TOPROL-XL) 25 MG 24 hr tablet TAKE 1 TABLET DAILY 90 tablet 1   mirtazapine (REMERON) 7.5 MG tablet Take 1 tablet (7.5 mg total) by mouth at bedtime. (Patient taking differently: Take 15 mg by mouth at bedtime.) 30 tablet 2   Multiple Vitamin (MULTI-VITAMIN) tablet Take 1 tablet by mouth daily.       pantoprazole (PROTONIX) 40 MG tablet Take 1 tablet (40 mg total) by mouth 2 (two) times daily. 60 tablet 1   pravastatin (PRAVACHOL) 10 MG tablet Take 1 tablet (10 mg total) by mouth every evening. 30 tablet 1              Home: Home Living Family/patient expects to be  discharged to:: Private residence Living Arrangements:  (lives with daughter, Steward Drone and son Alden Server) Available Help at Discharge: Family, Available 24 hours/day Type of Home: House Home Access: Stairs to enter, Other (comment) Entrance Stairs-Number of Steps: 4 Home Layout: Two level, Able to live on main level with bedroom/bathroom Alternate Level Stairs-Number of Steps: 14 Alternate Level Stairs-Rails: Left Bathroom Shower/Tub: Tub/shower unit Bathroom Toilet: Handicapped height Bathroom Accessibility: Yes Home Equipment: Agricultural consultant (2 wheels), [employer] - quad, BSC/3in1, Grab bars - tub/shower, Wheelchair - manual, Other (comment) (lift chair) Additional Comments: Pt reports living with daughter . Pt on supplemental O2 at home. (per chart review. Uses lift chair.)  Lives With: Son, Daughter   Functional History: Prior Function Prior Level of Function : Needs assist Physical Assist : Mobility (physical), ADLs (physical) Mobility (physical): Transfers, Gait, Bed mobility, Stairs ADLs (physical): IADLs, Dressing, Bathing Mobility Comments: household and short distanced community ambulator using quad-cane or RW ADLs Comments: Assited for bathing and dressing. Able to toilet, groom, and feed on her own. IADL assist.   Functional Status:  Mobility: Bed Mobility Overal bed mobility: Modified Independent Bed Mobility: Supine to Sit Supine to sit: Modified independent (Device/Increase time) Sit to supine: Supervision General bed mobility comments: increased time and labored Transfers Overall transfer level: Modified independent Equipment used: Rolling walker (2 wheels) Transfers: Sit to/from Stand Sit to Stand: Min assist (cueing for hand placement) Bed to/from chair/wheelchair/BSC transfer type:: Step pivot Step pivot transfers: Modified independent (Device/Increase time) General transfer comment: use of RW during transfers, sit to stand required min assist with cueing for  handplacement Ambulation/Gait Ambulation/Gait assistance: Min assist Gait Distance (Feet): 20 Feet Assistive device: Rolling walker (2 wheels) Gait Pattern/deviations: Step-through pattern, Decreased step length - right, Decreased step length - left, Decreased stride length General Gait Details: use of RW, slightly labored Gait velocity: slowed   ADL: ADL Overall ADL's : Needs assistance/impaired Eating/Feeding: Modified independent, Sitting Grooming: Modified independent, Sitting Upper Body Bathing: Set up, Sitting Lower Body Bathing: Moderate assistance, Sitting/lateral leans Upper Body Dressing : Set up, Sitting Lower Body Dressing: Set up, Sitting/lateral leans Lower Body Dressing Details (indicate cue type and reason): Pt able to doff and don socks seated in recliner. Toilet Transfer: Moderate assistance, Minimal assistance,  Ambulation, Stand-pivot, Rolling walker (2 wheels) Toilet Transfer Details (indicate cue type and reason): Simulated via ambulation in room and step pivot to chair from the bed with RW. Toileting- Clothing Manipulation and Hygiene: Minimal assistance, Min guard, Sitting/lateral lean Tub/ Engineer, structural: Moderate assistance, Stand-pivot Functional mobility during ADLs: Minimal assistance, Moderate assistance, Rolling walker (2 wheels)   Cognition: Cognition Overall Cognitive Status: Within Functional Limits for tasks assessed Orientation Level: Oriented X4 Cognition Arousal/Alertness: Awake/alert Behavior During Therapy: WFL for tasks assessed/performed Overall Cognitive Status: Within Functional Limits for tasks assessed   Physical Exam: Blood pressure (!) 159/80, pulse 84, temperature 97.6 F (36.4 C), resp. rate 20, height 5\' 3"  (1.6 m), weight 75.4 kg, SpO2 99%.   General: Alert and oriented x 3, No apparent distress HEENT: Head is normocephalic, atraumatic, PERRLA, EOMI, sclera anicteric, oral mucosa pink and moist, edentulous, ext ear canals  clear Neck: Supple without JVD or lymphadenopathy Heart: Reg rate and rhythm with frequent PACs. +systolic murmur Chest: frequent exp/insp wheezes, occasional rhonchi. O2 [place] @2L . Breathing non-labored Abdomen: Soft, non-tender, non-distended, bowel sounds positive. Extremities: No clubbing, cyanosis, or edema. Pulses are 2+ Psych: Pt's affect is appropriate. Pt is cooperative Skin: Clean and intact without signs of breakdown Neuro:  Alert and oriented x 3. Normal insight and awareness. Intact Memory. Normal language and speech. Cranial nerve exam unremarkable. A little HOH. MMT: UE grossly 4 to 4+/5 prox to distal. LE 3+ prox to 4+/5 ADF/PF. No focal sensory findings. DTR's 1+. No abnl resting tone.   Musculoskeletal: chronic DJD in hands, knees and feet. Minimal joint pain with ROM    Lab Results Last 48 Hours        Results for orders placed or performed during the hospital encounter of 05/20/23 (from the past 48 hour(s))  Basic metabolic panel     Status: Abnormal    Collection Time: 05/27/23  4:08 AM  Result Value Ref Range    Sodium 136 135 - 145 mmol/L    Potassium 4.6 3.5 - 5.1 mmol/L    Chloride 97 (L) 98 - 111 mmol/L    CO2 30 22 - 32 mmol/L    Glucose, Bld 80 70 - 99 mg/dL      Comment: Glucose reference range applies only to samples taken after fasting for at least 8 hours.    BUN 16 8 - 23 mg/dL    Creatinine, Ser 1.61 0.44 - 1.00 mg/dL    Calcium 8.9 8.9 - 09.6 mg/dL    GFR, Estimated 59 (L) >60 mL/min      Comment: (NOTE) Calculated using the CKD-EPI Creatinine Equation (2021)      Anion gap 9 5 - 15      Comment: Performed at [facility], [address], [place], [place] [address]  Magnesium     Status: Abnormal    Collection Time: 05/27/23  4:08 AM  Result Value Ref Range    Magnesium 1.6 (L) 1.7 - 2.4 mg/dL      Comment: Performed at [facility], [address], [place], [place] [address]  CBC     Status: Abnormal    Collection Time: 05/27/23  4:08 AM  Result  Value Ref Range    WBC 4.6 4.0 - 10.5 K/uL    RBC 2.93 (L) 3.87 - 5.11 MIL/uL    Hemoglobin 8.5 (L) 12.0 - 15.0 g/dL    HCT 14.7 (L) 82.9 - 46.0 %    MCV 106.8 (H) 80.0 - 100.0 fL  MCH 29.0 26.0 - 34.0 pg    MCHC 27.2 (L) 30.0 - 36.0 g/dL    RDW 16.1 (H) 09.6 - 15.5 %    Platelets 81 (L) 150 - 400 K/uL      Comment: SPECIMEN CHECKED FOR CLOTS Immature Platelet Fraction may be clinically indicated, consider ordering this additional test [id number]      nRBC 41.6 (H) 0.0 - 0.2 %      Comment: Performed at [facility], [address] [place], [place] [address]  Glucose, capillary     Status: Abnormal    Collection Time: 05/27/23  9:27 PM  Result Value Ref Range    Glucose-Capillary 122 (H) 70 - 99 mg/dL      Comment: Glucose reference range applies only to samples taken after fasting for at least 8 hours.    Comment 1 Notify RN      Comment 2 Document in Chart    CBC     Status: Abnormal    Collection Time: 05/28/23  4:21 AM  Result Value Ref Range    WBC 6.5 4.0 - 10.5 K/uL    RBC 2.67 (L) 3.87 - 5.11 MIL/uL    Hemoglobin 7.9 (L) 12.0 - 15.0 g/dL    HCT 82.9 (L) 56.2 - 46.0 %    MCV 106.4 (H) 80.0 - 100.0 fL    MCH 29.6 26.0 - 34.0 pg    MCHC 27.8 (L) 30.0 - 36.0 g/dL    RDW 13.0 (H) 86.5 - 15.5 %    Platelets 79 (L) 150 - 400 K/uL      Comment: SPECIMEN CHECKED FOR CLOTS Immature Platelet Fraction may be clinically indicated, consider ordering this additional test [id number] CONSISTENT WITH PREVIOUS RESULT REPEATED TO VERIFY      nRBC 21.3 (H) 0.0 - 0.2 %      Comment: Performed at [facility], [address] [place], [place] [address]      Imaging Results (Last 48 hours)  No results found.         Blood pressure (!) 159/80, pulse 84, temperature 97.6 F (36.4 C), resp. rate 20, height 5\' 3"  (1.6 m), weight 75.4 kg, SpO2 99%.   Medical Problem List and Plan: 1. Functional deficits secondary to debility secondary to acute on chronic hypoxic and hypercapnic  respiratory failure/COPD exacerbation/CAP/acute diastolic heart failure/acute delirium..  Patient uses supplemental oxygen at baseline at home of 2 L.             -patient may shower             -ELOS/Goals: 7 days, mod I to supervision goals with PT and OT 2.  Antithrombotics: -DVT/anticoagulation:  Mechanical: Antiembolism stockings, thigh (TED hose) Bilateral lower extremities             -antiplatelet therapy: N/A 3. Pain Management: Oxycodone 5 mg every 6 hours as needed moderate pain 4. Mood/Behavior/Sleep: Remeron 15 mg nightly  -pt reports that she continues to struggle with sleep. We discussed melatonin   -will begin 3mg  melatonin scheduled at bedtime             -antipsychotic agents: N/A 5. Neuropsych/cognition: This patient is capable of making decisions on her own behalf. 6. Skin/Wound Care: Routine skin checks 7. Fluids/Electrolytes/Nutrition: Routine and analysis with follow-up chemistries 8.  Hyperkalemia.  Resolved with aggressive treatments.  Her Lokelma has since been discontinued.  Patient had resumed her home oral dose of Lasix. 9.  Symptomatic chronic hyponatremia.  Nephrology  follow-up.  Urine sodium 75 with 275 urine osmolality.  Plasma sodium normalizing -Follow-up chemistries, monitor intake 10.  Acute HFpEF exacerbation .  Imdur 30 mg daily,  Lasix 20 mg daily, Toprol XL 25 mg daily.  Monitor for any signs of fluid overload 11.  Chronic orthostasis.  Florinef 0.1 mg daily.  Monitor with increased mobility 12.  MDS/CMML C/B neutropenia and thrombocytopenia.  Followed by hematology services Dr.Katragadda. 13.  Recent GI bleed.  Follow-up CBC on Monday - Protonix  daily for prophylaxis. 14.  Recent admission 6/4 - 6/7 for non-STEMI.  Followed by cardiology services Dr.Mallipeddi 15.  PAF.  Not a candidate for anticoagulation due to history of GI bleed.             -rate presently controlled, regular rhythm  16.  Hypothyroidism.  Synthroid 17.  Hyperlipidemia.   Pravachol     Mcarthur Rossetti Angiulli, PA-C 05/28/2023  I have personally performed a face to face diagnostic evaluation of this patient and formulated the key components of the plan.  Additionally, I have personally reviewed laboratory data, imaging studies, as well as relevant notes and concur with the physician assistant's documentation above.  The patient's status has not changed from the original H&P.  Any changes in documentation from the acute care chart have been noted above.  Ranelle Oyster, MD, [place]

## 2023-05-28 NOTE — Progress Notes (Signed)
Alert and oriented x 4.  Ambulated to chair with assist of one. Denies SOB and is on 2.5 liters baseline.  Has some wheezing.  No edema noted.  IV removed and report given to Jore, nurse at [facility].  Grandson at bedside. Waiting on [facility] which was set up by Theodoro Kos to arrive at 1230

## 2023-05-28 NOTE — Progress Notes (Signed)
Inpatient Rehab Admissions Coordinator:  There is a bed available at CIR for pt today. Dr. Sherryll Burger is aware and in agreement. Pt, pt's grandson Frederik Schmidt, NSG, and TOC also aware.   Wolfgang Phoenix, MS, CCC-SLP Admissions Coordinator [phone]

## 2023-05-28 NOTE — Discharge Summary (Signed)
Physician Discharge Summary  Lisa [name] QMV:[id number] DOB: 1934-08-04 DOA: 05/20/2023  PCP: [name], MD  Admit date: 05/20/2023  Discharge date: 05/28/2023  Admitted From:Home  Disposition:  CIR  Recommendations for Outpatient Follow-up:  Follow up at CIR Continue medications as noted below No further need for prednisone or doxycycline Continue Florinef as prescribed Follow-up with nephrology outpatient Follow-up with Dr. [name] outpatient for MDS  Home Health: None  Equipment/Devices: 2 L nasal cannula oxygen  Discharge Condition:Stable  CODE STATUS: Full  Diet recommendation: Heart Healthy  Brief/Interim Summary: 89 y.o. female with medical history significant for COPD c/b chronic hypoxic respiratory failure on 2L baseline, HFpEF, CKD3b, Paroxysmal Afib, and MDS/CMML c/b chronic neutropenia and thrombocytopenia, and chronic hyperkalemia, who presented with weakness and was admitted for acute on chronic hypoxic and hypercapnic respiratory failure and COPD exacerbation.   Lisa [name] was recently admitted 6/4-6/7 for an NSTEMI, though her course was also complicated by acute blood loss anemia (discharged with PPI BID per GI) and hyperkalemia (discharged with every other day [facility]).   On 7/18, Lisa [name] presented after EMS was called by her family because she had become progressively weaker over the prior 2 days. Also endorsed decreased appetite, increased confusion, sleeping throughout the day, and ultimately found to have increased O2 requirement after family checked O2 sats in 70s after Lisa [name] collapsed to the floor.  She was admitted with acute on chronic hypoxemic and hypercapnic respiratory failure secondary to community-acquired pneumonia with associated acute COPD exacerbation as well as acute diastolic CHF exacerbation.  She was maintained on IV antibiotics and then transition to oral and has completed her course of treatment as well as course of treatment  with steroids for COPD exacerbation.  She is back to her baseline nasal cannula oxygen requirement with no further wheezing or shortness of breath noted.  She was noted to have some hyperkalemia and was recommended to now remain on fludrocortisone per nephrology.  She was seen by PT with recommendations for acute inpatient rehab and has been awaiting for bed availability.  She is in stable condition for discharge today for CIR.  Discharge Diagnoses:  Principal Problem:   CAP (community acquired pneumonia) Active Problems:   Invasive ductal carcinoma of left breast (HCC)   Adenocarcinoma of left lung (HCC)   Neutropenia (HCC)   Myelodysplastic syndrome (HCC)   Hypothyroidism   Chronic respiratory failure with hypoxia (HCC)   Chronic renal failure (CRF), stage 3b (HCC)   Normocytic anemia   HTN (hypertension)   FTT (failure to thrive) in adult   Paroxysmal atrial fibrillation (HCC)   Hyponatremia   Pancytopenia (HCC)   Hyperkalemia   CAD (coronary artery disease)   History of non-ST elevation myocardial infarction (NSTEMI)   Metabolic acidosis   Elevated brain natriuretic peptide (BNP) level   Fever, unspecified   Altered mental status  Principal discharge diagnosis: Acute on chronic hypoxemic/hypercarbic respiratory failure-multifactorial.  Discharge Instructions  Discharge Instructions     Diet - low sodium heart healthy   Complete by: As directed    Increase activity slowly   Complete by: As directed       Allergies as of 05/28/2023       Reactions   Meloxicam Other (See Comments)   Caused an injury to the kidneys, per nephrologist   Micardis Hct [telmisartan-hctz] Other (See Comments)   Caused an injury to the kidneys, per nephrologist        Medication List  STOP taking these medications    Lokelma 10 g Pack packet Generic drug: sodium zirconium cyclosilicate       TAKE these medications    acetaminophen 325 MG tablet Commonly known as:  TYLENOL Take 2 tablets (650 mg total) by mouth every 6 (six) hours.   albuterol 108 (90 Base) MCG/ACT inhaler Commonly known as: VENTOLIN HFA Inhale 1-2 puffs into the lungs every 4 (four) hours as needed for wheezing or shortness of breath.   fludrocortisone 0.1 MG tablet Commonly known as: FLORINEF Take 1 tablet (0.1 mg total) by mouth daily. Start taking on: May 29, 2023   furosemide 20 MG tablet Commonly known as: LASIX Take 1 tablet (20 mg total) by mouth daily.   gabapentin 100 MG capsule Commonly known as: Neurontin Take 1 capsule (100 mg total) by mouth every 12 (twelve) hours as needed (neuropathy). What changed: when to take this   isosorbide mononitrate 30 MG 24 hr tablet Commonly known as: IMDUR Take 1 tablet (30 mg total) by mouth daily.   levothyroxine 100 MCG tablet Commonly known as: SYNTHROID Take 1 tablet (100 mcg total) by mouth daily before breakfast.   magnesium oxide 400 MG tablet Commonly known as: MAG-OX Take 400 mg by mouth 2 (two) times daily.   metoprolol succinate 25 MG 24 hr tablet Commonly known as: TOPROL-XL TAKE 1 TABLET DAILY   mirtazapine 7.5 MG tablet Commonly known as: REMERON Take 1 tablet (7.5 mg total) by mouth at bedtime. What changed: how much to take   Multi-Vitamin tablet Take 1 tablet by mouth daily.   pantoprazole 40 MG tablet Commonly known as: PROTONIX Take 1 tablet (40 mg total) by mouth 2 (two) times daily.   pravastatin 10 MG tablet Commonly known as: PRAVACHOL Take 1 tablet (10 mg total) by mouth every evening.   Trelegy Ellipta 100-62.5-25 MCG/ACT Aepb Generic drug: Fluticasone-Umeclidin-Vilant Inhale 1 puff into the lungs daily.        Follow-up Information     [facility] Follow up in 4 week(s).   Why: We will contact you with appointment details Contact information: [address] [place] [address] [phone]         [name], MD. Schedule an appointment as soon as  possible for a visit in 1 week(s).   Specialty: Internal Medicine Contact information: [address] [place] [address] [phone]                Allergies  Allergen Reactions   Meloxicam Other (See Comments)    Caused an injury to the kidneys, per nephrologist   Micardis Hct [Telmisartan-Hctz] Other (See Comments)    Caused an injury to the kidneys, per nephrologist    Consultations: PCCM Nephrology Palliative care   Procedures/Studies: DG CHEST PORT 1 VIEW  Result Date: 05/23/2023 CLINICAL DATA:  Evaluate for pulmonary infiltrates. EXAM: PORTABLE CHEST 1 VIEW COMPARISON:  05/20/2023 FINDINGS: Heart size is partially obscured by left lower lung opacity. Aortic atherosclerotic calcifications. Bilateral pleural effusions are noted. Scratch set persistent, small bilateral pleural effusions. Chronic coarsened interstitial markings and diffuse bronchial wall thickening again noted. Asymmetric opacification within the left base is unchanged. Atelectasis in the right base is improved from the previous exam. IMPRESSION: 1. Persistent, small bilateral pleural effusions. 2. Persistent asymmetric opacification within the left base which may reflect atelectasis or pneumonia. 3. Improved appearance of atelectasis in the right base. 4. Chronic bronchial wall thickening and coarsened interstitial markings. Electronically  Signed   By: Signa Kell M.D.   On: 05/23/2023 08:11   DG Chest Port 1 View  Result Date: 05/21/2023 CLINICAL DATA:  COPD exacerbation. EXAM: PORTABLE CHEST 1 VIEW COMPARISON:  05/20/2023 FINDINGS: Heart margins are partially obscured by opacity at the LEFT lung base, similar in appearance to prior study. Patchy infiltrates are seen throughout the lungs bilaterally and appear similar. Bilateral pleural effusions are present, LEFT greater than RIGHT. IMPRESSION: Bilateral pulmonary infiltrates and pleural effusions. Electronically Signed   By: Norva Pavlov M.D.    On: 05/21/2023 14:27   ECHOCARDIOGRAM LIMITED  Result Date: 05/20/2023    ECHOCARDIOGRAM LIMITED REPORT   Patient Name:   Kytzia Procida Date of Exam: 05/20/2023 Medical Rec #:  [id number]      Height:       63.0 in Accession #:    [id number]     Weight:       166.0 lb Date of Birth:  [birth year]      BSA:          1.786 m Patient Age:    89 years       BP:           111/57 mmHg Patient Gender: F              HR:           66 bpm. [facility] Procedure: Limited Echo, Cardiac Doppler and Limited Color Doppler Indications:    Bradycardia [[id number]]  History:        Patient has prior history of Echocardiogram examinations, most                 recent 04/07/2023. Previous Myocardial Infarction, CAP; Risk                 Factors:Former Smoker and Hypertension. Breast, Lung Cancer.  Sonographer:    Aron Baba Referring Phys: [id number] Cleora Fleet  Sonographer Comments: Image acquisition challenging due to patient behavioral factors. and Image acquisition challenging due to uncooperative patient. IMPRESSIONS  1. Limited Echo for LVEF assessment.  2. Left ventricular ejection fraction, by estimation, is 60 to 65%. The left ventricle has normal function. The left ventricle has no regional wall motion abnormalities. There is moderate left ventricular hypertrophy. Left ventricular diastolic function  could not be evaluated.  3. Right ventricular systolic function is normal. The right ventricular size is not well visualized. There is severely elevated pulmonary artery systolic pressure.  4. The mitral valve is normal in structure. Mild mitral valve regurgitation.  5. The tricuspid valve is abnormal. Tricuspid valve regurgitation is moderate to severe.  6. The aortic valve was not assessed. Aortic valve regurgitation is not visualized. Moderate aortic valve stenosis. Aortic valve Vmax measures 3.00 m/s.  7. The inferior vena cava is dilated in size with <50% respiratory variability, suggesting right atrial  pressure of 15 mmHg. Comparison(s): Changes from prior study are noted. Functional moderate to severe TR is new. FINDINGS  Left Ventricle: Left ventricular ejection fraction, by estimation, is 60 to 65%. The left ventricle has normal function. The left ventricle has no regional wall motion abnormalities. The left ventricular internal cavity size was normal in size. There is  moderate left ventricular hypertrophy. Left ventricular diastolic function could not be evaluated. Left ventricular diastolic function could not be evaluated due to nondiagnostic images. Right Ventricle: The right ventricular size is not well visualized. Right vetricular wall thickness was not assessed. Right ventricular  systolic function is normal. There is severely elevated pulmonary artery systolic pressure. The tricuspid regurgitant velocity is 3.50 m/s, and with an assumed right atrial pressure of 15 mmHg, the estimated right ventricular systolic pressure is 64.0 mmHg. Left Atrium: Left atrial size was not assessed. Right Atrium: Right atrial size was severely dilated. Pericardium: There is no evidence of pericardial effusion. Mitral Valve: The mitral valve is normal in structure. Mild mitral valve regurgitation. Tricuspid Valve: The tricuspid valve is abnormal. Tricuspid valve regurgitation is moderate to severe. Aortic Valve: The aortic valve was not assessed. Aortic valve regurgitation is not visualized. Moderate aortic stenosis is present. Aortic valve peak gradient measures 36.0 mmHg. Pulmonic Valve: The pulmonic valve was not assessed. Aorta: The aortic root is normal in size and structure. Venous: The inferior vena cava is dilated in size with less than 50% respiratory variability, suggesting right atrial pressure of 15 mmHg. IAS/Shunts: The interatrial septum was not well visualized. Additional Comments: Spectral Doppler performed. Color Doppler performed.  LEFT VENTRICLE PLAX 2D LVIDd:         3.70 cm LVIDs:         2.40 cm LV PW:          1.40 cm LV IVS:        1.30 cm  LEFT ATRIUM         Index LA diam:    4.00 cm 2.24 cm/m  AORTIC VALVE AV Vmax:      300.00 cm/s AV Peak Grad: 36.0 mmHg MITRAL VALVE                TRICUSPID VALVE MV Area (PHT): 3.77 cm     TR Peak grad:   49.0 mmHg MV Decel Time: 201 msec     TR Vmax:        350.00 cm/s MR Peak grad: 113.2 mmHg MR Vmax:      532.00 cm/s MV E velocity: 113.00 cm/s MV A velocity: 84.80 cm/s MV E/A ratio:  1.33 Vishnu Priya Mallipeddi Electronically signed by Winfield Rast Mallipeddi Signature Date/Time: 05/20/2023/1:55:43 PM    Final    DG Chest Port 1 View  Result Date: 05/20/2023 CLINICAL DATA:  Sepsis EXAM: PORTABLE CHEST 1 VIEW COMPARISON:  04/06/2023 FINDINGS: Symmetric pulmonary insufflation. Moderate perihilar and lower lung zone interstitial pulmonary infiltrate is present most in keeping with moderate cardiogenic failure. Small bilateral, left greater than right, pleural effusions are present. No pneumothorax. Cardiac size is enlarged, unchanged. No acute bone abnormality. IMPRESSION: 1. Moderate cardiogenic failure. Electronically Signed   By: Helyn Numbers M.D.   On: 05/20/2023 03:35   CT Head Wo Contrast  Result Date: 05/20/2023 CLINICAL DATA:  Mental status change, unknown cause EXAM: CT HEAD WITHOUT CONTRAST TECHNIQUE: Contiguous axial images were obtained from the base of the skull through the vertex without intravenous contrast. RADIATION DOSE REDUCTION: This exam was performed according to the departmental dose-optimization program which includes automated exposure control, adjustment of the mA and/or kV according to patient size and/or use of iterative reconstruction technique. COMPARISON:  04/14/2023 FINDINGS: Brain: Normal anatomic configuration. Parenchymal volume loss is commensurate with the patient's age. Stable moderate periventricular white matter changes are present likely reflecting the sequela of small vessel ischemia. No abnormal intra or extra-axial mass  lesion or fluid collection. No abnormal mass effect or midline shift. No evidence of acute intracranial hemorrhage or infarct. Ventricular size is normal. Cerebellum unremarkable. Vascular: No asymmetric hyperdense vasculature at the skull base. Skull: Intact Sinuses/Orbits: Paranasal  sinuses are clear. Orbits are unremarkable. Other: Mastoid air cells and middle ear cavities are clear. IMPRESSION: 1. No acute intracranial hemorrhage or infarct. 2. Stable senescent change. Electronically Signed   By: Helyn Numbers M.D.   On: 05/20/2023 03:26   CT BONE MARROW BIOPSY & ASPIRATION  Result Date: 04/29/2023 INDICATION: 89 year old female referred for bone marrow biopsy EXAM: CT BONE MARROW BIOPSY AND ASPIRATION MEDICATIONS: None. ANESTHESIA/SEDATION: Moderate (conscious) sedation was employed during this procedure. A total of Versed 1.0 mg and Fentanyl 50 mcg was administered intravenously. Moderate Sedation Time: 10 minutes. The patient's level of consciousness and vital signs were monitored continuously by radiology nursing throughout the procedure under my direct supervision. FLUOROSCOPY TIME:  CT COMPLICATIONS: None PROCEDURE: Informed written consent was obtained from the patient after a thorough discussion of the procedural risks, benefits and alternatives. All questions were addressed. Maximal Sterile Barrier Technique was utilized including caps, mask, sterile gowns, sterile gloves, sterile drape, hand hygiene and skin antiseptic. A timeout was performed prior to the initiation of the procedure. Scout CT of the pelvis was performed for surgical planning purposes. The posterior pelvis was prepped with Chlorhexidine in a sterile fashion, and a sterile drape was applied covering the operative field. A sterile gown and sterile gloves were used for the procedure. Local anesthesia was provided with 1% Lidocaine. Posterior right iliac bone was targeted for biopsy. The skin and subcutaneous tissues were infiltrated  with 1% lidocaine without epinephrine. A small stab incision was made with an 11 blade scalpel, and an 11 gauge Murphy needle was advanced with CT guidance to the posterior cortex. Manual forced was used to advance the needle through the posterior cortex and the stylet was removed. A bone marrow aspirate was retrieved and passed to a cytotechnologist in the room. The Murphy needle was then advanced without the stylet for a core biopsy. The core biopsy was retrieved and also passed to a cytotechnologist. Manual pressure was used for hemostasis and a sterile dressing was placed. No complications were encountered no significant blood loss was encountered. Patient tolerated the procedure well and remained hemodynamically stable throughout. IMPRESSION: Status post image guided bone marrow biopsy. Signed, Yvone Neu. Miachel Roux, RPVI [facility] Electronically Signed   By: Gilmer Mor D.O.   On: 04/29/2023 12:58     Discharge Exam: Vitals:   05/27/23 2054 05/28/23 0458  BP: (!) 160/93 (!) 159/80  Pulse: 78 84  Resp: 18 20  Temp: 98.1 F (36.7 C) 97.6 F (36.4 C)  SpO2: 100% 99%   Vitals:   05/27/23 0413 05/27/23 1245 05/27/23 2054 05/28/23 0458  BP: 138/78 125/85 (!) 160/93 (!) 159/80  Pulse:  77 78 84  Resp: 20 18 18 20   Temp: 98.3 F (36.8 C) 97.9 F (36.6 C) 98.1 F (36.7 C) 97.6 F (36.4 C)  TempSrc:  Oral Oral   SpO2: 100% 100% 100% 99%  Weight:      Height:        General: Pt is alert, awake, not in acute distress Cardiovascular: RRR, S1/S2 +, no rubs, no gallops Respiratory: CTA bilaterally, no wheezing, no rhonchi, 2.5 L nasal cannula oxygen Abdominal: Soft, NT, ND, bowel sounds + Extremities: no edema, no cyanosis    The results of significant diagnostics from this hospitalization (including imaging, microbiology, ancillary and laboratory) are listed below for reference.     Microbiology: Recent Results (from  the past 240 hour(s))  Resp panel by RT-PCR (  RSV, Flu A&B, Covid) Anterior Nasal Swab     Status: None   Collection Time: 05/20/23  2:45 AM   Specimen: Anterior Nasal Swab  Result Value Ref Range Status   SARS Coronavirus 2 by RT PCR NEGATIVE NEGATIVE Final    Comment: (NOTE) SARS-CoV-2 target nucleic acids are NOT DETECTED.  The SARS-CoV-2 RNA is generally detectable in upper respiratory specimens during the acute phase of infection. The lowest concentration of SARS-CoV-2 viral copies this assay can detect is 138 copies/mL. A negative result does not preclude SARS-Cov-2 infection and should not be used as the sole basis for treatment or other patient management decisions. A negative result may occur with  improper specimen collection/handling, submission of specimen other than nasopharyngeal swab, presence of viral mutation(s) within the areas targeted by this assay, and inadequate number of viral copies(<138 copies/mL). A negative result must be combined with clinical observations, patient history, and epidemiological information. The expected result is Negative.  Fact Sheet for Patients:  BloggerCourse.com  Fact Sheet for Healthcare Providers:  SeriousBroker.it  This test is no t yet approved or cleared by the [place] FDA and  has been authorized for detection and/or diagnosis of SARS-CoV-2 by FDA under an Emergency Use Authorization (EUA). This EUA will remain  in effect (meaning this test can be used) for the duration of the COVID-19 declaration under Section 564(b)(1) of the Act, 21 U.S.C.section 360bbb-3(b)(1), unless the authorization is terminated  or revoked sooner.       Influenza A by PCR NEGATIVE NEGATIVE Final   Influenza B by PCR NEGATIVE NEGATIVE Final    Comment: (NOTE) The Xpert Xpress SARS-CoV-2/FLU/RSV plus assay is intended as an aid in the diagnosis of influenza from Nasopharyngeal swab specimens  and should not be used as a sole basis for treatment. Nasal washings and aspirates are unacceptable for Xpert Xpress SARS-CoV-2/FLU/RSV testing.  Fact Sheet for Patients: BloggerCourse.com  Fact Sheet for Healthcare Providers: SeriousBroker.it  This test is not yet approved or cleared by the [place] FDA and has been authorized for detection and/or diagnosis of SARS-CoV-2 by FDA under an Emergency Use Authorization (EUA). This EUA will remain in effect (meaning this test can be used) for the duration of the COVID-19 declaration under Section 564(b)(1) of the Act, 21 U.S.C. section 360bbb-3(b)(1), unless the authorization is terminated or revoked.     Resp Syncytial Virus by PCR NEGATIVE NEGATIVE Final    Comment: (NOTE) Fact Sheet for Patients: BloggerCourse.com  Fact Sheet for Healthcare Providers: SeriousBroker.it  This test is not yet approved or cleared by the [place] FDA and has been authorized for detection and/or diagnosis of SARS-CoV-2 by FDA under an Emergency Use Authorization (EUA). This EUA will remain in effect (meaning this test can be used) for the duration of the COVID-19 declaration under Section 564(b)(1) of the Act, 21 U.S.C. section 360bbb-3(b)(1), unless the authorization is terminated or revoked.  Performed at [facility], [address] [place], [place] [address]   Blood Culture (routine x 2)     Status: None   Collection Time: 05/20/23  2:45 AM   Specimen: Left Antecubital; Blood  Result Value Ref Range Status   Specimen Description LEFT ANTECUBITAL  Final   Special Requests   Final    BOTTLES DRAWN AEROBIC AND ANAEROBIC Blood Culture adequate volume   Culture   Final    NO GROWTH 5 DAYS Performed at [facility], [address] [place], [place] [address]    Report  Status 05/25/2023 FINAL  Final  Blood Culture (routine x 2)     Status:  None   Collection Time: 05/20/23  2:50 AM   Specimen: BLOOD RIGHT HAND  Result Value Ref Range Status   Specimen Description BLOOD RIGHT HAND  Final   Special Requests   Final    BOTTLES DRAWN AEROBIC AND ANAEROBIC Blood Culture adequate volume   Culture   Final    NO GROWTH 5 DAYS Performed at [facility], [address] [place], [place] [address]    Report Status 05/25/2023 FINAL  Final  MRSA Next Gen by PCR, Nasal     Status: None   Collection Time: 05/20/23 12:45 PM   Specimen: Nasal Mucosa; Nasal Swab  Result Value Ref Range Status   MRSA by PCR Next Gen NOT DETECTED NOT DETECTED Final    Comment: (NOTE) The GeneXpert MRSA Assay (FDA approved for NASAL specimens only), is one component of a comprehensive MRSA colonization surveillance program. It is not intended to diagnose MRSA infection nor to guide or monitor treatment for MRSA infections. Test performance is not FDA approved in patients less than 69 years old. Performed at [facility], [address], [place], [place] [address]      Labs: BNP (last 3 results) Recent Labs    11/03/22 0744 05/20/23 0247  BNP 743.0* 1,095.0*   Basic Metabolic Panel: Recent Labs  Lab 05/24/23 0414 05/24/23 2144 05/25/23 0441 05/27/23 0408 05/28/23 0421  NA 136 134* 135 136 136  K 3.1* 3.6 3.2* 4.6 4.1  CL 97* 97* 98 97* 96*  CO2 30 26 25 30 30   GLUCOSE 96 136* 95 80 83  BUN 22 19 19 16 18   CREATININE 0.98 1.08* 1.09* 0.94 1.05*  CALCIUM 8.8* 8.7* 8.7* 8.9 9.0  MG 1.6* 2.2 2.1 1.6* 1.7   Liver Function Tests: Recent Labs  Lab 05/24/23 2144  AST 36  ALT 24  ALKPHOS 44  BILITOT 0.7  PROT 8.0  ALBUMIN 3.1*   No results for input(s): "LIPASE", "AMYLASE" in the last 168 hours. Recent Labs  Lab 05/23/23 0744  AMMONIA 24   CBC: Recent Labs  Lab 05/23/23 1914 05/24/23 0414 05/25/23 0441 05/27/23 0408 05/28/23 0421  WBC 3.0* 5.1 3.9* 4.6 6.5  HGB 7.8* 8.1* 7.9* 8.5* 7.9*  HCT 27.9* 27.7* 28.2* 31.3* 28.4*   MCV 103.0* 101.5* 102.9* 106.8* 106.4*  PLT 69* 72* 67* 81* 79*   Cardiac Enzymes: No results for input(s): "CKTOTAL", "CKMB", "CKMBINDEX", "TROPONINI" in the last 168 hours. BNP: Invalid input(s): "POCBNP" CBG: Recent Labs  Lab 05/23/23 1639 05/23/23 2203 05/27/23 2127  GLUCAP 117* 119* 122*   D-Dimer No results for input(s): "DDIMER" in the last 72 hours. Hgb A1c No results for input(s): "HGBA1C" in the last 72 hours. Lipid Profile No results for input(s): "CHOL", "HDL", "LDLCALC", "TRIG", "CHOLHDL", "LDLDIRECT" in the last 72 hours. Thyroid function studies No results for input(s): "TSH", "T4TOTAL", "T3FREE", "THYROIDAB" in the last 72 hours.  Invalid input(s): "FREET3" Anemia work up No results for input(s): "VITAMINB12", "FOLATE", "FERRITIN", "TIBC", "IRON", "RETICCTPCT" in the last 72 hours. Urinalysis    Component Value Date/Time   COLORURINE YELLOW 05/20/2023 0606   APPEARANCEUR CLEAR 05/20/2023 0606   LABSPEC 1.015 05/20/2023 0606   PHURINE 5.0 05/20/2023 0606   GLUCOSEU NEGATIVE 05/20/2023 0606   HGBUR NEGATIVE 05/20/2023 0606   BILIRUBINUR NEGATIVE 05/20/2023 0606   KETONESUR NEGATIVE 05/20/2023 0606   PROTEINUR 30 (A) 05/20/2023 0606  NITRITE NEGATIVE 05/20/2023 0606   LEUKOCYTESUR NEGATIVE 05/20/2023 0606   Sepsis Labs Recent Labs  Lab 05/24/23 0414 05/25/23 0441 05/27/23 0408 05/28/23 0421  WBC 5.1 3.9* 4.6 6.5   Microbiology Recent Results (from the past 240 hour(s))  Resp panel by RT-PCR (RSV, Flu A&B, Covid) Anterior Nasal Swab     Status: None   Collection Time: 05/20/23  2:45 AM   Specimen: Anterior Nasal Swab  Result Value Ref Range Status   SARS Coronavirus 2 by RT PCR NEGATIVE NEGATIVE Final    Comment: (NOTE) SARS-CoV-2 target nucleic acids are NOT DETECTED.  The SARS-CoV-2 RNA is generally detectable in upper respiratory specimens during the acute phase of infection. The lowest concentration of SARS-CoV-2 viral copies this  assay can detect is 138 copies/mL. A negative result does not preclude SARS-Cov-2 infection and should not be used as the sole basis for treatment or other patient management decisions. A negative result may occur with  improper specimen collection/handling, submission of specimen other than nasopharyngeal swab, presence of viral mutation(s) within the areas targeted by this assay, and inadequate number of viral copies(<138 copies/mL). A negative result must be combined with clinical observations, patient history, and epidemiological information. The expected result is Negative.  Fact Sheet for Patients:  BloggerCourse.com  Fact Sheet for Healthcare Providers:  SeriousBroker.it  This test is no t yet approved or cleared by the [place] FDA and  has been authorized for detection and/or diagnosis of SARS-CoV-2 by FDA under an Emergency Use Authorization (EUA). This EUA will remain  in effect (meaning this test can be used) for the duration of the COVID-19 declaration under Section 564(b)(1) of the Act, 21 U.S.C.section 360bbb-3(b)(1), unless the authorization is terminated  or revoked sooner.       Influenza A by PCR NEGATIVE NEGATIVE Final   Influenza B by PCR NEGATIVE NEGATIVE Final    Comment: (NOTE) The Xpert Xpress SARS-CoV-2/FLU/RSV plus assay is intended as an aid in the diagnosis of influenza from Nasopharyngeal swab specimens and should not be used as a sole basis for treatment. Nasal washings and aspirates are unacceptable for Xpert Xpress SARS-CoV-2/FLU/RSV testing.  Fact Sheet for Patients: BloggerCourse.com  Fact Sheet for Healthcare Providers: SeriousBroker.it  This test is not yet approved or cleared by the [place] FDA and has been authorized for detection and/or diagnosis of SARS-CoV-2 by FDA under an Emergency Use Authorization (EUA). This EUA will  remain in effect (meaning this test can be used) for the duration of the COVID-19 declaration under Section 564(b)(1) of the Act, 21 U.S.C. section 360bbb-3(b)(1), unless the authorization is terminated or revoked.     Resp Syncytial Virus by PCR NEGATIVE NEGATIVE Final    Comment: (NOTE) Fact Sheet for Patients: BloggerCourse.com  Fact Sheet for Healthcare Providers: SeriousBroker.it  This test is not yet approved or cleared by the [place] FDA and has been authorized for detection and/or diagnosis of SARS-CoV-2 by FDA under an Emergency Use Authorization (EUA). This EUA will remain in effect (meaning this test can be used) for the duration of the COVID-19 declaration under Section 564(b)(1) of the Act, 21 U.S.C. section 360bbb-3(b)(1), unless the authorization is terminated or revoked.  Performed at [facility], [address] [place], [place] [address]   Blood Culture (routine x 2)     Status: None   Collection Time: 05/20/23  2:45 AM   Specimen: Left Antecubital; Blood  Result Value Ref Range Status   Specimen Description LEFT ANTECUBITAL  Final   Special Requests   Final    BOTTLES DRAWN AEROBIC AND ANAEROBIC Blood Culture adequate volume   Culture   Final    NO GROWTH 5 DAYS Performed at [facility], [address] [place], [place] [address]    Report Status 05/25/2023 FINAL  Final  Blood Culture (routine x 2)     Status: None   Collection Time: 05/20/23  2:50 AM   Specimen: BLOOD RIGHT HAND  Result Value Ref Range Status   Specimen Description BLOOD RIGHT HAND  Final   Special Requests   Final    BOTTLES DRAWN AEROBIC AND ANAEROBIC Blood Culture adequate volume   Culture   Final    NO GROWTH 5 DAYS Performed at [facility], [address] [place], [place] [address]    Report Status 05/25/2023 FINAL  Final  MRSA Next Gen by PCR, Nasal     Status: None   Collection Time: 05/20/23 12:45 PM   Specimen:  Nasal Mucosa; Nasal Swab  Result Value Ref Range Status   MRSA by PCR Next Gen NOT DETECTED NOT DETECTED Final    Comment: (NOTE) The GeneXpert MRSA Assay (FDA approved for NASAL specimens only), is one component of a comprehensive MRSA colonization surveillance program. It is not intended to diagnose MRSA infection nor to guide or monitor treatment for MRSA infections. Test performance is not FDA approved in patients less than 40 years old. Performed at [facility], [address] [place], [place] [address]      Time coordinating discharge: 35 minutes  SIGNED:   Erick Blinks, DO [facility] 05/28/2023, 10:13 AM  If 7PM-7AM, please contact night-coverage [email or web address]

## 2023-05-28 NOTE — Discharge Instructions (Addendum)
Inpatient Rehab Discharge Instructions  Lisa [name] Discharge date and time: 06/11/2023   Activities/Precautions/ Functional Status: Activity: activity as tolerated Diet: regular diet Wound Care: Routine skin checks Functional status:  ___ No restrictions     ___ Walk up steps independently _x__ 24/7 supervision/assistance   ___ Walk up steps with assistance ___ Intermittent supervision/assistance  ___ Bathe/dress independently ___ Walk with walker     _x__ Bathe/dress with assistance ___ Walk Independently    ___ Shower independently ___ Walk with assistance    ___ Shower with assistance __x_ No alcohol     ___ Return to work/school ________  Special Instructions: No driving smoking or alcohol  Recommend daily BP measurement in same arm and record time of day. Bring this information with you to follow-up appointment with PCP.  Weight yourself daily at the same time each day. If weight is equal to or over 160 pounds, take one Lasix 20 mg tablet.  COMMUNITY REFERRALS UPON DISCHARGE:    Home Health:   PT     OT     ST                   Agency: [facility] Phone: [phone]    My questions have been answered and I understand these instructions. I will adhere to these goals and the provided educational materials after my discharge from the hospital.  Patient/Caregiver Signature _______________________________ Date __________  Clinician Signature _______________________________________ Date __________  Please bring this form and your medication list with you to all your follow-up doctor's appointments.

## 2023-05-28 NOTE — Plan of Care (Signed)

## 2023-05-28 NOTE — Care Management Important Message (Signed)
Important Message  Patient Details  Name: Lisa [name] MRN: [id number] Date of Birth: [birth year]   Medicare Important Message Given:  Yes     Corey Harold 05/28/2023, 11:10 AM

## 2023-05-28 NOTE — Progress Notes (Signed)
Inpatient Rehabilitation Admission Medication Review by a Pharmacist  A complete drug regimen review was completed for this patient to identify any potential clinically significant medication issues.  High Risk Drug Classes Is patient taking? Indication by Medication  Antipsychotic No   Anticoagulant No   Antibiotic No   Opioid Yes OxyIR- acute pain  Antiplatelet No   Hypoglycemics/insulin No   Vasoactive Medication Yes Lasix, Imdur, Toprol- HTN  Chemotherapy No   Other Yes Synthroid- hypothyroidism Remeron- sleep/MDD Protonix- GERD Pravachol- HLD     Type of Medication Issue Identified Description of Issue Recommendation(s)  Drug Interaction(s) (clinically significant)     Duplicate Therapy     Allergy     No Medication Administration End Date     Incorrect Dose     Additional Drug Therapy Needed     Significant med changes from prior encounter (inform family/care partners about these prior to discharge).    Other  PTA med: Trelegy Gabapentin Lokelma Restart PTA meds when and if necessary during CIR admission or at time of discharge, if warranted    Clinically significant medication issues were identified that warrant physician communication and completion of prescribed/recommended actions by midnight of the next day:  No   Time spent performing this drug regimen review (minutes):  30   Tremaine Fuhriman BS, PharmD, BCPS Clinical Pharmacist 05/29/2023 7:32 AM  Contact: [phone] after 3 PM  "Be curious, not judgmental..." -Debbora Dus

## 2023-05-28 NOTE — Progress Notes (Signed)
Received pt via stretcher, pt tried to walk with assistance to transfer to bed but failed. Pt was able to stand at bedside. Transferred to pt to bed safely. Made pt comfortable in bed. Vital signs taken, vital signs stable. No complaints of pain.

## 2023-05-28 NOTE — H&P (Signed)
Physical Medicine and Rehabilitation Admission H&P    Chief Complaint  Patient presents with   Altered Mental Status  : HPI: Lisa [name] is a 89 year old right-handed female and well-known to CIR from admission 10/30/2020 - 11/13/2020 for debility secondary to AKI superimposed on CKD stage III/hyponatremia/atrial fibrillation and was discharged to home ambulating 80 feet contact-guard assist.  Patient also with history of of breast cancer in 1999 treated with lumpectomy and radiation therapy as well as tamoxifen for 5 years, lung cancer with partial upper lobe lobectomy 2006, pancytopenia followed by hematology services Dr.Katragadda In the past, hypothyroidism, COPD/respiratory failure quit smoking 29 years ago was using 2 L oxygen at baseline, gout.  Patient with recent admission 6/4 - 6/7 for non-STEMI course complicated by acute blood loss anemia discharged with PPI twice daily per GI and hyperkalemia discharged with every other day Lokelma.  Per chart review patient lives with her daughter.  Two-level home but patient stays on main level with 4 steps to entry of home.  Her grandson is POA-Dr. Dorann Lodge.  Presented to [facility]/20/2023 with generalized weakness x 2 days as well as endorsing decreased appetite, increased confusion and ultimately found to have increased O2 requirements after family checked oxygen saturations in the 70s.  She did have a witnessed fall at home prior to admission denying that she struck her head.  On presentation advance 6 L to maintain oxygen saturations greater than 90% ABG showed 7.2 3/65/46/26.  Her potassium was elevated 6.5 without EKG changes given calcium gluconate, Lokelma 5 g and 2 x 1L LR boluses.  Chest x-ray showed moderate perihilar and lower lobe interstitial pulmonary edema.  BNP elevated at 1095 with normal lactate.  Also found to have stable and chronic neutropenia with ANC 200, and stable on chronic thrombocytopenia platelets  101,000.  She did have a low-grade fever of 101.4.  Cranial CT scan showed no acute intracranial changes.  Over the course of that afternoon and night of 7/18 consulted PCCM for BiPAP.  Continued to have management of potassium with Lasix and Lokelma.  Ceftriaxone azithromycin initiated 7/19 after initially being placed on doxycycline. Antibiotic course has since been completed.  Patient required intravenous methylprednisolone course completed started on oral prednisone 30 mg daily.  Patient with chronic hyponatremia with a goal sodium of 128-130.  Urine sodium 75 with 275 urine osmolality not appearing as SIADH, nor adrenal insufficiency giving morning cortisol within normal limits  and latest sodium level of 136.  Patient had brief chest pain episode 7/22 workup showed peak at bedtime troponin 318, 297, 299 cardiology follow-up plan medical management.  Echocardiogram with ejection fraction of 60 to 65% no wall motion abnormalities.  Patient not a candidate for anticoagulation due to high bleeding risk.  Palliative care was consulted to establish goals of care.  Therapy evaluations completed due to patient decreased functional mobility and weakness was admitted for a comprehensive rehab program.  Review of Systems  Constitutional:  Positive for fever and malaise/fatigue.  HENT:  Negative for hearing loss.   Eyes:  Negative for blurred vision and double vision.  Respiratory:  Positive for shortness of breath. Negative for cough and wheezing.   Cardiovascular:  Positive for palpitations and leg swelling.  Gastrointestinal:  Positive for constipation and nausea. Negative for vomiting.  Genitourinary:  Negative for dysuria, flank pain and hematuria.  Musculoskeletal:  Positive for falls, joint pain and myalgias.  Skin:  Negative for rash.  Neurological:  Positive  for dizziness, weakness and headaches.  All other systems reviewed and are negative.  Past Medical History:  Diagnosis Date   Adenocarcinoma  of left lung (HCC) 2006   Arthritis    Asthma    Cancer of breast, female (HCC)    Cancer of lung (HCC)    Dysrhythmia    Hypertension    Hypothyroidism    Invasive ductal carcinoma of left breast (HCC) 1999   Myocardial infarction (HCC)    Neutropenia (HCC) 06/09/2016   Personal history of radiation therapy    Past Surgical History:  Procedure Laterality Date   BIOPSY  10/23/2020   Procedure: BIOPSY;  Surgeon: Benancio Deeds, MD;  Location: [facility] ENDOSCOPY;  Service: Gastroenterology;;   BREAST BIOPSY     left axillary node dissection   BREAST LUMPECTOMY Left    COLONOSCOPY  03/08/2003   WUJ:WJXBJYNWGN rectal polyps destroyed with the tip of the snare/Polyps at hepatic flexure, splenic flexure at 35 cm/Left-sided diverticula: unable to retrieve path   COLONOSCOPY  09/12/2008   FAO:ZHYQMV rectum and distal sigmoid diminutive polyps/scattered left sided diverticulum. hyperplastic   COLONOSCOPY N/A 03/06/2013   HQI:ONGEXBM polyp-removed as described above; colonic diverticulosis. hyperplastic polyps. next TCS 03/2018   ESOPHAGOGASTRODUODENOSCOPY (EGD) WITH PROPOFOL N/A 10/23/2020   Procedure: ESOPHAGOGASTRODUODENOSCOPY (EGD) WITH PROPOFOL;  Surgeon: Benancio Deeds, MD;  Location: [facility] ENDOSCOPY;  Service: Gastroenterology;  Laterality: N/A;   FOOT SURGERY     LUNG REMOVAL, PARTIAL     upper lobe   Family History  Problem Relation Age of Onset   Cancer Mother    Cancer Sister    Colon cancer Neg Hx    Social History:  reports that she quit smoking about 31 years ago. Her smoking use included cigarettes. She started smoking about 66 years ago. She has a 26.3 pack-year smoking history. She has never used smokeless tobacco. She reports that she does not drink alcohol and does not use drugs. Allergies:  Allergies  Allergen Reactions   Meloxicam Other (See Comments)    Caused an injury to the kidneys, per nephrologist   Micardis Hct [Telmisartan-Hctz] Other (See Comments)     Caused an injury to the kidneys, per nephrologist   Medications Prior to Admission  Medication Sig Dispense Refill   acetaminophen (TYLENOL) 325 MG tablet Take 2 tablets (650 mg total) by mouth every 6 (six) hours.     albuterol (VENTOLIN HFA) 108 (90 Base) MCG/ACT inhaler Inhale 1-2 puffs into the lungs every 4 (four) hours as needed for wheezing or shortness of breath.     Fluticasone-Umeclidin-Vilant (TRELEGY ELLIPTA) 100-62.5-25 MCG/ACT AEPB Inhale 1 puff into the lungs daily. 28 each 0   furosemide (LASIX) 20 MG tablet Take 1 tablet (20 mg total) by mouth daily. 30 tablet 1   gabapentin (NEURONTIN) 100 MG capsule Take 1 capsule (100 mg total) by mouth every 12 (twelve) hours as needed (neuropathy). (Patient taking differently: Take 100 mg by mouth 3 (three) times daily as needed (neuropathy).) 60 capsule 1   isosorbide mononitrate (IMDUR) 30 MG 24 hr tablet Take 1 tablet (30 mg total) by mouth daily. 30 tablet 2   levothyroxine (SYNTHROID) 100 MCG tablet Take 1 tablet (100 mcg total) by mouth daily before breakfast. 30 tablet 1   LOKELMA 10 g PACK packet Take 10 g by mouth every other day.     magnesium oxide (MAG-OX) 400 MG tablet Take 400 mg by mouth 2 (two) times daily.     metoprolol  succinate (TOPROL-XL) 25 MG 24 hr tablet TAKE 1 TABLET DAILY 90 tablet 1   mirtazapine (REMERON) 7.5 MG tablet Take 1 tablet (7.5 mg total) by mouth at bedtime. (Patient taking differently: Take 15 mg by mouth at bedtime.) 30 tablet 2   Multiple Vitamin (MULTI-VITAMIN) tablet Take 1 tablet by mouth daily.     pantoprazole (PROTONIX) 40 MG tablet Take 1 tablet (40 mg total) by mouth 2 (two) times daily. 60 tablet 1   pravastatin (PRAVACHOL) 10 MG tablet Take 1 tablet (10 mg total) by mouth every evening. 30 tablet 1      Home: Home Living Family/patient expects to be discharged to:: Private residence Living Arrangements:  (lives with daughter, Steward Drone and son Alden Server) Available Help at Discharge: Family,  Available 24 hours/day Type of Home: House Home Access: Stairs to enter, Other (comment) Entrance Stairs-Number of Steps: 4 Home Layout: Two level, Able to live on main level with bedroom/bathroom Alternate Level Stairs-Number of Steps: 14 Alternate Level Stairs-Rails: Left Bathroom Shower/Tub: Tub/shower unit Bathroom Toilet: Handicapped height Bathroom Accessibility: Yes Home Equipment: Agricultural consultant (2 wheels), [employer] - quad, BSC/3in1, Grab bars - tub/shower, Wheelchair - manual, Other (comment) (lift chair) Additional Comments: Pt reports living with daughter . Pt on supplemental O2 at home. (per chart review. Uses lift chair.)  Lives With: Son, Daughter   Functional History: Prior Function Prior Level of Function : Needs assist Physical Assist : Mobility (physical), ADLs (physical) Mobility (physical): Transfers, Gait, Bed mobility, Stairs ADLs (physical): IADLs, Dressing, Bathing Mobility Comments: household and short distanced community ambulator using quad-cane or RW ADLs Comments: Assited for bathing and dressing. Able to toilet, groom, and feed on her own. IADL assist.  Functional Status:  Mobility: Bed Mobility Overal bed mobility: Modified Independent Bed Mobility: Supine to Sit Supine to sit: Modified independent (Device/Increase time) Sit to supine: Supervision General bed mobility comments: increased time and labored Transfers Overall transfer level: Modified independent Equipment used: Rolling walker (2 wheels) Transfers: Sit to/from Stand Sit to Stand: Min assist (cueing for hand placement) Bed to/from chair/wheelchair/BSC transfer type:: Step pivot Step pivot transfers: Modified independent (Device/Increase time) General transfer comment: use of RW during transfers, sit to stand required min assist with cueing for handplacement Ambulation/Gait Ambulation/Gait assistance: Min assist Gait Distance (Feet): 20 Feet Assistive device: Rolling walker (2  wheels) Gait Pattern/deviations: Step-through pattern, Decreased step length - right, Decreased step length - left, Decreased stride length General Gait Details: use of RW, slightly labored Gait velocity: slowed    ADL: ADL Overall ADL's : Needs assistance/impaired Eating/Feeding: Modified independent, Sitting Grooming: Modified independent, Sitting Upper Body Bathing: Set up, Sitting Lower Body Bathing: Moderate assistance, Sitting/lateral leans Upper Body Dressing : Set up, Sitting Lower Body Dressing: Set up, Sitting/lateral leans Lower Body Dressing Details (indicate cue type and reason): Pt able to doff and don socks seated in recliner. Toilet Transfer: Moderate assistance, Minimal assistance, Ambulation, Stand-pivot, Rolling walker (2 wheels) Toilet Transfer Details (indicate cue type and reason): Simulated via ambulation in room and step pivot to chair from the bed with RW. Toileting- Clothing Manipulation and Hygiene: Minimal assistance, Min guard, Sitting/lateral lean Tub/ Engineer, structural: Moderate assistance, Stand-pivot Functional mobility during ADLs: Minimal assistance, Moderate assistance, Rolling walker (2 wheels)  Cognition: Cognition Overall Cognitive Status: Within Functional Limits for tasks assessed Orientation Level: Oriented X4 Cognition Arousal/Alertness: Awake/alert Behavior During Therapy: WFL for tasks assessed/performed Overall Cognitive Status: Within Functional Limits for tasks assessed  Physical Exam: Blood pressure (!) 159/80,  pulse 84, temperature 97.6 F (36.4 C), resp. rate 20, height 5\' 3"  (1.6 m), weight 75.4 kg, SpO2 99%. Physical Exam  Results for orders placed or performed during the hospital encounter of 05/20/23 (from the past 48 hour(s))  Basic metabolic panel     Status: Abnormal   Collection Time: 05/27/23  4:08 AM  Result Value Ref Range   Sodium 136 135 - 145 mmol/L   Potassium 4.6 3.5 - 5.1 mmol/L   Chloride 97 (L) 98 - 111  mmol/L   CO2 30 22 - 32 mmol/L   Glucose, Bld 80 70 - 99 mg/dL    Comment: Glucose reference range applies only to samples taken after fasting for at least 8 hours.   BUN 16 8 - 23 mg/dL   Creatinine, Ser 1.61 0.44 - 1.00 mg/dL   Calcium 8.9 8.9 - 09.6 mg/dL   GFR, Estimated 59 (L) >60 mL/min    Comment: (NOTE) Calculated using the CKD-EPI Creatinine Equation (2021)    Anion gap 9 5 - 15    Comment: Performed at [facility], [address] [place], [place] [address]  Magnesium     Status: Abnormal   Collection Time: 05/27/23  4:08 AM  Result Value Ref Range   Magnesium 1.6 (L) 1.7 - 2.4 mg/dL    Comment: Performed at [facility], [address] [place], [place] [address]  CBC     Status: Abnormal   Collection Time: 05/27/23  4:08 AM  Result Value Ref Range   WBC 4.6 4.0 - 10.5 K/uL   RBC 2.93 (L) 3.87 - 5.11 MIL/uL   Hemoglobin 8.5 (L) 12.0 - 15.0 g/dL   HCT 14.7 (L) 82.9 - 56.2 %   MCV 106.8 (H) 80.0 - 100.0 fL   MCH 29.0 26.0 - 34.0 pg   MCHC 27.2 (L) 30.0 - 36.0 g/dL   RDW 13.0 (H) 86.5 - 78.4 %   Platelets 81 (L) 150 - 400 K/uL    Comment: SPECIMEN CHECKED FOR CLOTS Immature Platelet Fraction may be clinically indicated, consider ordering this additional test [id number]    nRBC 41.6 (H) 0.0 - 0.2 %    Comment: Performed at [facility], [address], [place], [place] [address]  Glucose, capillary     Status: Abnormal   Collection Time: 05/27/23  9:27 PM  Result Value Ref Range   Glucose-Capillary 122 (H) 70 - 99 mg/dL    Comment: Glucose reference range applies only to samples taken after fasting for at least 8 hours.   Comment 1 Notify RN    Comment 2 Document in Chart   CBC     Status: Abnormal   Collection Time: 05/28/23  4:21 AM  Result Value Ref Range   WBC 6.5 4.0 - 10.5 K/uL   RBC 2.67 (L) 3.87 - 5.11 MIL/uL   Hemoglobin 7.9 (L) 12.0 - 15.0 g/dL   HCT 40.1 (L) 02.7 - 25.3 %   MCV 106.4 (H) 80.0 - 100.0 fL   MCH 29.6 26.0 - 34.0 pg   MCHC 27.8 (L) 30.0  - 36.0 g/dL   RDW 66.4 (H) 40.3 - 47.4 %   Platelets 79 (L) 150 - 400 K/uL    Comment: SPECIMEN CHECKED FOR CLOTS Immature Platelet Fraction may be clinically indicated, consider ordering this additional test [id number] CONSISTENT WITH PREVIOUS RESULT REPEATED TO VERIFY    nRBC 21.3 (H) 0.0 - 0.2 %    Comment: Performed at [facility], [address] [place], [place] [address]   No results found.    Blood pressure (!) 159/80, pulse 84, temperature 97.6 F (36.4 C), resp. rate 20, height 5\' 3"  (1.6 m), weight 75.4 kg, SpO2 99%.  Medical Problem List and Plan: 1. Functional deficits secondary to debility secondary to acute on chronic hypoxic and hypercapnic respiratory failure/COPD exacerbation/CAP/acute diastolic heart failure/acute delirium..  Patient uses supplemental oxygen at baseline at home of 2 L.  -patient may shower  -ELOS/Goals: 7 days, mod I to supervision goals with PT and OT 2.  Antithrombotics: -DVT/anticoagulation:  Mechanical: Antiembolism stockings, thigh (TED hose) Bilateral lower extremities  -antiplatelet therapy: N/A 3. Pain Management: Oxycodone 5 mg every 6 hours as needed moderate pain 4. Mood/Behavior/Sleep: Remeron 15 mg nightly  -antipsychotic agents: N/A 5. Neuropsych/cognition: This patient is capable of making decisions on her own behalf. 6. Skin/Wound Care: Routine skin checks 7. Fluids/Electrolytes/Nutrition: Routine and analysis with follow-up chemistries 8.  Hyperkalemia.  Resolved with aggressive treatments.  Her Lokelma has since been discontinued.  Patient had resumed her home oral dose of Lasix. 9.  Symptomatic chronic hyponatremia.  Nephrology follow-up.  Urine sodium 75 with 275 urine osmolality.  Plasma sodium normalizing -Follow-up chemistries, monitor intake 10.  Acute HFpEF exacerbation .  Imdur 30 mg daily,  Lasix 20 mg daily, Toprol XL 25 mg daily.  Monitor for any signs of fluid overload 11.  Chronic orthostasis.  Florinef 0.1 mg  daily.  Monitor with increased mobility 12.  MDS/CMML C/B neutropenia and thrombocytopenia.  Followed by hematology services Dr.Katragadda. 13.  Recent GI bleed.  Follow-up CBC on Monday - Protonix  daily for prophylaxis. 14.  Recent admission 6/4 - 6/7 for non-STEMI.  Followed by cardiology services Dr.Mallipeddi 15.  PAF.  Not a candidate for anticoagulation due to history of GI bleed.  -rate presently controlled 16.  Hypothyroidism.  Synthroid 17.  Hyperlipidemia.  Pravachol   Mcarthur Rossetti Angiulli, PA-C 05/28/2023

## 2023-05-29 DIAGNOSIS — I5031 Acute diastolic (congestive) heart failure: Secondary | ICD-10-CM

## 2023-05-29 DIAGNOSIS — R5381 Other malaise: Principal | ICD-10-CM

## 2023-05-29 NOTE — Progress Notes (Signed)
Physical Therapy Assessment and Plan  Patient Details  Name: Lisa [name] MRN: [id number] Date of Birth: 10/01/1934  PT Diagnosis: Difficulty walking, Impaired sensation, and Muscle weakness Rehab Potential: Good ELOS: 12-14 days   Today's Date: 05/29/2023 PT Individual Time: 1045-1200 PT Individual Time Calculation (min): 75 min    Hospital Problem: Principal Problem:   Debility   Past Medical History:  Past Medical History:  Diagnosis Date   Adenocarcinoma of left lung (HCC) 2006   Arthritis    Asthma    Cancer of breast, female (HCC)    Cancer of lung (HCC)    Dysrhythmia    Hypertension    Hypothyroidism    Invasive ductal carcinoma of left breast (HCC) 1999   Myocardial infarction (HCC)    Neutropenia (HCC) 06/09/2016   Personal history of radiation therapy    Past Surgical History:  Past Surgical History:  Procedure Laterality Date   BIOPSY  10/23/2020   Procedure: BIOPSY;  Surgeon: Benancio Deeds, MD;  Location: [facility];  Service: Gastroenterology;;   BREAST BIOPSY     left axillary node dissection   BREAST LUMPECTOMY Left    COLONOSCOPY  03/08/2003   OVF:IEPPIRJJOA rectal polyps destroyed with the tip of the snare/Polyps at hepatic flexure, splenic flexure at 35 cm/Left-sided diverticula: unable to retrieve path   COLONOSCOPY  09/12/2008   CZY:SAYTKZ rectum and distal sigmoid diminutive polyps/scattered left sided diverticulum. hyperplastic   COLONOSCOPY N/A 03/06/2013   SWF:UXNATFT polyp-removed as described above; colonic diverticulosis. hyperplastic polyps. next TCS 03/2018   ESOPHAGOGASTRODUODENOSCOPY (EGD) WITH PROPOFOL N/A 10/23/2020   Procedure: ESOPHAGOGASTRODUODENOSCOPY (EGD) WITH PROPOFOL;  Surgeon: Benancio Deeds, MD;  Location: [facility] ENDOSCOPY;  Service: Gastroenterology;  Laterality: N/A;   FOOT SURGERY     LUNG REMOVAL, PARTIAL     upper lobe    Assessment & Plan Clinical Impression: Patient is a 89 year old right-handed female  and well-known to CIR from admission 10/30/2020 - 11/13/2020 for debility secondary to AKI superimposed on CKD stage III/hyponatremia/atrial fibrillation and was discharged to home ambulating 80 feet contact-guard assist. Patient also with history of of breast cancer in 1999 treated with lumpectomy and radiation therapy as well as tamoxifen for 5 years, lung cancer with partial upper lobe lobectomy 2006, pancytopenia followed by hematology services Dr.Katragadda In the past, hypothyroidism, COPD/respiratory failure quit smoking 29 years ago was using 2 L oxygen at baseline, gout. Patient with recent admission 6/4 - 6/7 for non-STEMI course complicated by acute blood loss anemia discharged with PPI twice daily per GI and hyperkalemia discharged with every other day Lokelma. Per chart review patient lives with her daughter. Two-level home but patient stays on main level with 4 steps to entry of home. Her grandson is POA-Dr. Dorann Lodge. Presented to [facility] 05/20/2023 with generalized weakness x 2 days as well as endorsing decreased appetite, increased confusion and ultimately found to have increased O2 requirements after family checked oxygen saturations in the 70s. She did have a witnessed fall at home prior to admission denying that she struck her head. On presentation advance 6 L to maintain oxygen saturations greater than 90% ABG showed 7.2 3/65/46/26. Her potassium was elevated 6.5 without EKG changes given calcium gluconate, Lokelma 5 g and 2 x 1L LR boluses. Chest x-ray showed moderate perihilar and lower lobe interstitial pulmonary edema. BNP elevated at 1095 with normal lactate. Also found to have stable and chronic neutropenia with ANC 200, and stable on chronic thrombocytopenia platelets 101,000. She did  have a low-grade fever of 101.4. Cranial CT scan showed no acute intracranial changes. Over the course of that afternoon and night of 7/18 consulted PCCM for BiPAP. Continued to have  management of potassium with Lasix and Lokelma. Ceftriaxone azithromycin initiated 7/19 after initially being placed on doxycycline. Antibiotic course has since been completed. Patient required intravenous methylprednisolone course completed started on oral prednisone 30 mg daily. Patient with chronic hyponatremia with a goal sodium of 128-130. Urine sodium 75 with 275 urine osmolality not appearing as SIADH, nor adrenal insufficiency giving morning cortisol within normal limits and latest sodium level of 136. Patient had brief chest pain episode 7/22 workup showed peak at bedtime troponin 318, 297, 299 cardiology follow-up plan medical management. Echocardiogram with ejection fraction of 60 to 65% no wall motion abnormalities. Patient not a candidate for anticoagulation due to high bleeding risk. Palliative care was consulted to establish goals of care. Therapy evaluations completed due to patient decreased functional mobility and weakness was admitted for a comprehensive rehab program.   Patient currently requires mod with mobility secondary to muscle weakness, decreased cardiorespiratoy endurance and decreased oxygen support, and decreased sitting balance, decreased standing balance, decreased balance strategies, and impaired sensation .  Prior to hospitalization, patient was modified independent  with mobility and lived with Daughter, Son in a House home.  Home access is Front stairs: 4-5 steps with R HR; Back stairs: 4-5 steps with R HRStairs to enter.  Patient will benefit from skilled PT intervention to maximize safe functional mobility, minimize fall risk, and decrease caregiver burden for planned discharge home with intermittent assist.  Anticipate patient will benefit from follow up OP at discharge.  PT - End of Session Activity Tolerance: Tolerates 30+ min activity with multiple rests Endurance Deficit: Yes Endurance Deficit Description: Global deconditioning/weakness PT Assessment Rehab  Potential (ACUTE/IP ONLY): Good PT Barriers to Discharge: Home environment access/layout;Insurance for SNF coverage PT Patient demonstrates impairments in the following area(s): Balance;Safety;Sensory;Endurance;Motor PT Transfers Functional Problem(s): Bed Mobility;Bed to Chair;Car;Furniture PT Locomotion Functional Problem(s): Ambulation;Wheelchair Mobility;Stairs PT Plan PT Intensity: Minimum of 1-2 x/day ,45 to 90 minutes PT Frequency: 5 out of 7 days PT Duration Estimated Length of Stay: 12-14 days PT Treatment/Interventions: Ambulation/gait training;DME/adaptive equipment instruction;Community reintegration;Psychosocial support;Neuromuscular re-education;Stair training;Wheelchair propulsion/positioning;UE/LE Strength taining/ROM;Balance/vestibular training;Discharge planning;Pain management;Functional electrical stimulation;Therapeutic Activities;UE/LE Coordination activities;Cognitive remediation/compensation;Disease management/prevention;Functional mobility training;Patient/family education;Splinting/orthotics;Therapeutic Exercise;Visual/perceptual remediation/compensation PT Transfers Anticipated Outcome(s): ModI with LRAD PT Locomotion Anticipated Outcome(s): ModI with LRAD for household distances PT Recommendation Recommendations for Other Services: Neuropsych consult;Therapeutic Recreation consult Therapeutic Recreation Interventions: Pet therapy Follow Up Recommendations: Outpatient PT Patient destination: Home Equipment Recommended: To be determined Equipment Details: TBD   PT Evaluation Precautions/Restrictions Precautions Precautions: Fall Precaution Comments: 2L oxygen via [place]; HOH- No hearing aids Restrictions Weight Bearing Restrictions: No Pain Pain Assessment Pain Scale: 0-10 Pain Score: 0-No pain Pain Interference Pain Interference Pain Effect on Sleep: 2. Occasionally Pain Interference with Therapy Activities: 2. Occasionally;1. Rarely or not at all Pain  Interference with Day-to-Day Activities: 1. Rarely or not at all Home Living/Prior Functioning Home Living Available Help at Discharge: Family;Available 24 hours/day Type of Home: House Home Access: Stairs to enter [employer] of Steps: Front stairs: 4-5 steps with R HR; Back stairs: 4-5 steps with R HR Entrance Stairs-Rails: Right Home Layout: Two level;Laundry or work area in basement;Able to live on main level with bedroom/bathroom Alternate Level Stairs-Number of Steps: 14- HR on R when descending, L when ascending Alternate Level Stairs-Rails: Left Bathroom Shower/Tub: Tub/shower unit  Bathroom Toilet: Handicapped height Bathroom Accessibility: Yes Additional Comments: At baseline, patient reports being on 2L oxygen via [place] at home.  Lives With: Daughter;Son Prior Function Level of Independence: Independent with transfers;Independent with gait;Independent with homemaking with ambulation;Requires assistive device for independence (Reports using a walker and a cane)  Able to Take Stairs?: Yes Driving: Yes Vocation: Retired Leisure: Hobbies-yes (Comment) (Enjoys cooking, eating out, odd little jobs around the house- Not a Dealer") Vision/Perception  Vision - History Ability to See in Adequate Light: 1 Impaired Perception Perception: Within Functional Limits Praxis Praxis: Intact  Cognition Orientation Level: Oriented X4 Year: 2024 Month: July Day of Week: Correct Memory: Appears intact Awareness: Appears intact Problem Solving: Appears intact Safety/Judgment: Appears intact Sensation Sensation Light Touch: Impaired by gross assessment (More dull sensation on RLE compared to LLE) Additional Comments: Patient reports that ever since she's been sick she has tingling in her feet and her hands which comes and goes. Motor      Trunk/Postural Assessment  Cervical Assessment Cervical Assessment: Exceptions to [facility] (Forward head) Thoracic Assessment Thoracic  Assessment: Exceptions to [facility] (Rounded shoulders) Lumbar Assessment Lumbar Assessment: Exceptions to [facility] (Posterior pelvic tilt) Postural Control Postural Control: Deficits on evaluation Righting Reactions: Delayed secondary to global deconditioning  Balance Balance Balance Assessed: Yes Static Sitting Balance Static Sitting - Balance Support: Feet supported;No upper extremity supported Static Sitting - Level of Assistance: 5: Stand by assistance Dynamic Sitting Balance Dynamic Sitting - Balance Support: During functional activity;Feet unsupported Dynamic Sitting - Level of Assistance: 5: Stand by assistance Static Standing Balance Static Standing - Balance Support: Bilateral upper extremity supported Static Standing - Level of Assistance: 4: Min assist;5: Stand by assistance Dynamic Standing Balance Dynamic Standing - Balance Support: During functional activity;Bilateral upper extremity supported Dynamic Standing - Level of Assistance: 4: Min assist Extremity Assessment      RLE Assessment RLE Assessment: Exceptions to [facility] General Strength Comments: Grossly 3+/5; R hip flexor and doesiflexor 3/5 LLE Assessment LLE Assessment: Exceptions to [facility] General Strength Comments: Grossly 3+/5; Lt hip flexor 3/5  Care Tool Care Tool Bed Mobility Roll left and right activity   Roll left and right assist level: Moderate Assistance - Patient 50 - 74%    Sit to lying activity   Sit to lying assist level: Moderate Assistance - Patient 50 - 74%    Lying to sitting on side of bed activity   Lying to sitting on side of bed assist level: the ability to move from lying on the back to sitting on the side of the bed with no back support.: Moderate Assistance - Patient 50 - 74%     Care Tool Transfers Sit to stand transfer   Sit to stand assist level: Maximal Assistance - Patient 25 - 49%    Chair/bed transfer   Chair/bed transfer assist level: Moderate Assistance - Patient 50 - 74%      Psychologist, clinical transfer assist level: Moderate Assistance - Patient 50 - 74%      Care Tool Locomotion Ambulation   Assist level: Minimal Assistance - Patient > 75% Assistive device: Walker-rolling Max distance: 17'  Walk 10 feet activity   Assist level: Minimal Assistance - Patient > 75% Assistive device: Walker-rolling   Walk 50 feet with 2 turns activity Walk 50 feet with 2 turns activity did not occur: Safety/medical concerns (Patient unable to ambulate >17' at this time secondary to  global deconditioning and weakness)      Walk 150 feet activity Walk 150 feet activity did not occur: Safety/medical concerns      Walk 10 feet on uneven surfaces activity   Assist level: Minimal Assistance - Patient > 75% Assistive device: Walker-rolling  Stairs   Assist level: Minimal Assistance - Patient > 75% Stairs assistive device: 2 hand rails Max number of stairs: 8  Walk up/down 1 step activity   Walk up/down 1 step (curb) assist level: Minimal Assistance - Patient > 75% Walk up/down 1 step or curb assistive device: 2 hand rails  Walk up/down 4 steps activity   Walk up/down 4 steps assist level: Minimal Assistance - Patient > 75% Walk up/down 4 steps assistive device: 2 hand rails  Walk up/down 12 steps activity Walk up/down 12 steps activity did not occur: Safety/medical concerns (Patient unable to ascend/descend >8 steps at this time secondary to global deconditioning and weakness)      Pick up small objects from floor   Pick up small object from the floor assist level: Moderate Assistance - Patient 50 - 74% Pick up small object from the floor assistive device: RW  Wheelchair Is the patient using a wheelchair?: Yes Type of Wheelchair: Manual   Wheelchair assist level: Supervision/Verbal cueing Max wheelchair distance: 150'  Wheel 50 feet with 2 turns activity   Assist Level: Minimal Assistance - Patient > 75%  Wheel 150 feet activity   Assist  Level: Minimal Assistance - Patient > 75%    Refer to Care Plan for Long Term Goals  SHORT TERM GOAL WEEK 1 PT Short Term Goal 1 (Week 1): Patient will perform bed mobility with MinA PT Short Term Goal 2 (Week 1): Patient will perform sit/stand with LRAD and MinA consistently PT Short Term Goal 3 (Week 1): Patient will ambulate >50' with LRAD and MinA  Recommendations for other services: Neuropsych and Therapeutic Recreation  Pet therapy and Stress management  Skilled Therapeutic Intervention Mobility Bed Mobility Bed Mobility: Rolling Right;Rolling Left;Supine to Sit;Sit to Supine Rolling Right: Moderate Assistance - Patient 50-74% Rolling Left: Moderate Assistance - Patient 50-74% Supine to Sit: Moderate Assistance - Patient 50-74% Sit to Supine: Moderate Assistance - Patient 50-74% Transfers Transfers: Sit to Stand;Stand to Sit;Stand Pivot Transfers Sit to Stand: Maximal Assistance - Patient 25-49% Stand to Sit: Maximal Assistance - Patient 25-49% Stand Pivot Transfers: Minimal Assistance - Patient > 75% Stand Pivot Transfer Details: Verbal cues for sequencing;Verbal cues for precautions/safety;Verbal cues for safe use of DME/AE;Verbal cues for gait pattern Transfer (Assistive device): Rolling walker Locomotion  Gait Ambulation: Yes Gait Assistance: Minimal Assistance - Patient > 75% Gait Distance (Feet): 17 Feet Assistive device: Rolling walker Gait Assistance Details: Verbal cues for sequencing;Verbal cues for precautions/safety;Verbal cues for safe use of DME/AE;Verbal cues for gait pattern;Verbal cues for technique Gait Gait: Yes Gait Pattern: Impaired (B knee flexion, increased UE use, flexed trunk, downward gaze) Gait velocity: Decreased Stairs / Additional Locomotion Stairs: Yes Stairs Assistance: Minimal Assistance - Patient > 75% Stair Management Technique: Two rails Number of Stairs: 8 Height of Stairs: 2 Ramp: Minimal Assistance - Patient >75% Curb: Minimal  Assistance - Patient >75% Wheelchair Mobility Wheelchair Mobility: Yes Wheelchair Assistance: Doctor, general practice: Both upper extremities Wheelchair Parts Management: Needs assistance Distance: 150'   Skilled Intervention- Patient greeted sitting upright in wheelchair in her room and agreeable to PT treatment session. Evaluation completed (see details above and below) with education on PT POC and goals  and individual treatment initiated with focus on bed mobility, sit/stands, transfers, gait, stair mobility, wc mobility, discharge planning, education, etc. Patient performed sit/stands with Mod/MaxA and use of RW with VC for increased anterior weight shift throughout- For all other functional mobility patient required Min/ModA with the use of a RW. Patient remained on 2L oxygen via [place] throughout treatment session with SpO2 ~99% at rest and 95% with exertion. Patient returned to her room and left sitting upright in wheelchair with posey belt on, call bell within reach and all needs met.    Discharge Criteria: Patient will be discharged from PT if patient refuses treatment 3 consecutive times without medical reason, if treatment goals not met, if there is a change in medical status, if patient makes no progress towards goals or if patient is discharged from hospital.  The above assessment, treatment plan, treatment alternatives and goals were discussed and mutually agreed upon: by patient  Lisa  Caylan [name] 05/29/2023, 12:11 PM

## 2023-05-29 NOTE — Plan of Care (Signed)
Problem: RH Balance Goal: LTG Patient will maintain dynamic standing balance (PT) Description: LTG:  Patient will maintain dynamic standing balance with assistance during mobility activities (PT) Flowsheets (Taken 05/29/2023 1209) LTG: Pt will maintain dynamic standing balance during mobility activities with:: Independent with assistive device    Problem: Sit to Stand Goal: LTG:  Patient will perform sit to stand with assistance level (PT) Description: LTG:  Patient will perform sit to stand with assistance level (PT) Flowsheets (Taken 05/29/2023 1209) LTG: PT will perform sit to stand in preparation for functional mobility with assistance level: Independent with assistive device   Problem: RH Bed Mobility Goal: LTG Patient will perform bed mobility with assist (PT) Description: LTG: Patient will perform bed mobility with assistance, with/without cues (PT). Flowsheets (Taken 05/29/2023 1209) LTG: Pt will perform bed mobility with assistance level of: Independent with assistive device    Problem: RH Bed to Chair Transfers Goal: LTG Patient will perform bed/chair transfers w/assist (PT) Description: LTG: Patient will perform bed to chair transfers with assistance (PT). Flowsheets (Taken 05/29/2023 1209) LTG: Pt will perform Bed to Chair Transfers with assistance level: Independent with assistive device    Problem: RH Car Transfers Goal: LTG Patient will perform car transfers with assist (PT) Description: LTG: Patient will perform car transfers with assistance (PT). Flowsheets (Taken 05/29/2023 1209) LTG: Pt will perform car transfers with assist:: Supervision/Verbal cueing   Problem: RH Furniture Transfers Goal: LTG Patient will perform furniture transfers w/assist (OT/PT) Description: LTG: Patient will perform furniture transfers  with assistance (OT/PT). Flowsheets (Taken 05/29/2023 1209) LTG: Pt will perform furniture transfers with assist:: Independent with assistive device     Problem: RH Ambulation Goal: LTG Patient will ambulate in home environment (PT) Description: LTG: Patient will ambulate in home environment, # of feet with assistance (PT). Flowsheets (Taken 05/29/2023 1209) LTG: Pt will ambulate in home environ  assist needed:: Independent with assistive device LTG: Ambulation distance in home environment: 50' Goal: LTG Patient will ambulate in community environment (PT) Description: LTG: Patient will ambulate in community environment, # of feet with assistance (PT). Flowsheets (Taken 05/29/2023 1209) LTG: Pt will ambulate in community environ  assist needed:: Supervision/Verbal cueing LTG: Ambulation distance in community environment: 150'   Problem: RH Wheelchair Mobility Goal: LTG Patient will propel w/c in community environment (PT) Description: LTG: Patient will propel wheelchair in community environment, # of feet with assist (PT) Flowsheets (Taken 05/29/2023 1209) LTG: Pt will propel w/c in community environ  assist needed:: Independent with assistive device Distance: wheelchair distance in controlled environment: 150   Problem: RH Stairs Goal: LTG Patient will ambulate up and down stairs w/assist (PT) Description: LTG: Patient will ambulate up and down # of stairs with assistance (PT) Flowsheets (Taken 05/29/2023 1209) LTG: Pt will ambulate up/down stairs assist needed:: Supervision/Verbal cueing LTG: Pt will  ambulate up and down number of stairs: 4

## 2023-05-29 NOTE — Progress Notes (Signed)
1Occupational Therapy Session Note  Patient Details  Name: Lisa [name] MRN: [id number] Date of Birth: 05-01-1934  Today's Date: 05/29/2023 OT Individual Time: 4098-1191 OT Individual Time Calculation (min): 60 min    Short Term Goals: Week 1:  OT Short Term Goal 1 (Week 1): patient will complete sit to stand using a RW for addiitonal balance at [employer] OT Short Term Goal 2 (Week 1): The pt will complete UB dressing for donning and doffing at MinA  at 95% safe OT Short Term Goal 3 (Week 1): The pt will bathe UB/LB at ModI  using A&E as needed at 95% safe after demonstration and initial instruction. OT Short Term Goal 4 (Week 1): The pt will complete LB dressing for donning and doffing at MinA  at 95% safe OT Short Term Goal 5 (Week 1): The pt will complete all stand pivot  transfers from one surface to another at [facility]  using the RW and grab bars at 95% safe.  Skilled Therapeutic Interventions/Progress Updates:  Therapeutic Exercise;UE/LE Strength taining/ROM;Therapeutic Activities;Self Care/advanced ADL retraining Patient seen this day following OT evaluation for treatment. The pt agreed to completing UB exercises. Treatment was initialed with the pt performing a simulated task in UB dressing using the yellow resistive band at  [facility]  for task performance.  The pt went on to complete a simulated task in LB dressing using the medium resistive theraband, after demonstration with MinA for coming from sit to stand and during task performance.  The pt was instructed to weight bear through BUE  when going from standing to sitting to minimize her risk for injury of the tail bone by plopping. The pt was receptive to feedback.  The pt went on to  complete Squigz exercises by removing the peg from her table to top by  color, incorporating bilateral hands. The pt followed  that exercise by using the 1lb dowel for shld flexion, horizontal abduction, and shld rotation,  2 sets of 10 with rest breaks as needed the  pt require 2 rest breaks with each exercise. At the end of the session, the pt remained at w/c LOF with the safety alarm in place, the call light and bedside table  within reach and all addition needs met prior to exiting the room.  Therapy Documentation Precautions:  Precautions Precautions: Fall Precaution Comments: 2L oxygen via [place]; HOH- No hearing aids Restrictions Weight Bearing Restrictions: No  Therapy/Group: Individual Therapy  Lavona Mound 05/29/2023, 5:54 PM

## 2023-05-29 NOTE — Evaluation (Signed)
Occupational Therapy Assessment and Plan  Patient Details  Name: Lisa [name] MRN: [id number] Date of Birth: 12/02/1933  OT Diagnosis: muscle weakness (generalized) Rehab Potential: Rehab Potential (ACUTE ONLY): Good ELOS: 12-14days   Today's Date: 05/29/2023 OT Individual Time: 8657-8469 OT Individual Time Calculation (min): 75 min     Hospital Problem: Patient Admitting Diagnosis: Debility with COPD exacerbation, CAP and acute diastolic failure   History of Present Illness: 89 year old female with history of COPD, chronic hypoxic respiratory failure on 2 liters at baseline, CKD 3 b, HFpEF, paroxysmal Afib and MDS/CML with neutropenia and thrombocytopenia. Recent admission 6/24 for STEMI complicated by acute blood loss anemia and hyperkalemia. Presented on 05/20/23 with progressive weakness for 2 days. Decreased appetite, increased confusion, sleeping throughout the day, and found to had increased 02 requirements. Witnessed fall to the floor.   ABG'S worsened on admission and moved to ICU for BIPAP placement. Hyperkalemic without EKG changes treated with Calcium gluconate, Lokelma and LR boluses. CXR c/w moderate perihilar and lower lobe interstitial pulmonary edema. BNP elevated. CT head negative for acute process. PCCM consulted as well as Nephrology.    Transferred to floor. Added IS and flutter valve. Ceftriaxone and Azithromycin began , now completed oral doxycycline. Treated with IV furosemide with good results . Now on home oral dosage. Pulmicort nebs. IV methylprednisolone completed and on oral prednisone now. Duo neb and continue supplemental baseline O2. Has diuresed over 4.1 liters since admission. On home dosage.    Hyperkalemia resolved with aggressive treatment with Lokelma, lasix and insulin with D 50. Nephrology felt to be chronic problem. Hyponatremia appeared to be acute on chronic problem secondary to poor po intake. Improved alongside lasix. Follow up with nephrology as an  outpatient. Acute CHF exacerbation. Imdur daily, metoprolol and furosemide for treatment and also HTN. Out Patient follow up for Neutropenia for chronic Leukemia. Chronic thrombocytopenia. Protonix for GI protection given recent history of GI Bleed. Pravastatin at HS for CAD. Metoprolol for a fib. Brief chest pain episode 7/22. Cardiology plans for medical and symptom management. Levothyroxine for hypothyroidism.    Palliative consulted for GOC discussions.    Patient's medical record from [facility] has been reviewed by the rehabilitation admission coordinator and physician.     Past Medical History:  Past Medical History:  Diagnosis Date   Adenocarcinoma of left lung (HCC) 2006   Arthritis    Asthma    Cancer of breast, female (HCC)    Cancer of lung (HCC)    Dysrhythmia    Hypertension    Hypothyroidism    Invasive ductal carcinoma of left breast (HCC) 1999   Myocardial infarction (HCC)    Neutropenia (HCC) 06/09/2016   Personal history of radiation therapy    Past Surgical History:  Past Surgical History:  Procedure Laterality Date   BIOPSY  10/23/2020   Procedure: BIOPSY;  Surgeon: Benancio Deeds, MD;  Location: [facility] ENDOSCOPY;  Service: Gastroenterology;;   BREAST BIOPSY     left axillary node dissection   BREAST LUMPECTOMY Left    COLONOSCOPY  03/08/2003   GEX:[id number] rectal polyps destroyed with the tip of the snare/Polyps at hepatic flexure, splenic flexure at 35 cm/Left-sided diverticula: unable to retrieve path   COLONOSCOPY  09/12/2008   NUU:[id number] rectum and distal sigmoid diminutive polyps/scattered left sided diverticulum. hyperplastic   COLONOSCOPY N/A 03/06/2013   YQI:[id number] polyp-removed as described above; colonic diverticulosis. hyperplastic polyps. next TCS 03/2018   ESOPHAGOGASTRODUODENOSCOPY (EGD) WITH PROPOFOL N/A 10/23/2020   Procedure:  ESOPHAGOGASTRODUODENOSCOPY (EGD) WITH PROPOFOL;  Surgeon: Benancio Deeds, MD;  Location: [facility] ENDOSCOPY;  Service:  Gastroenterology;  Laterality: N/A;   FOOT SURGERY     LUNG REMOVAL, PARTIAL     upper lobe    Assessment & Plan Clinical Impression: Patient is a 89 y.o. year old female with recent admission to the hospital on 05/20/23 with gen muscle weakness.  Patient transferred to CIR on 05/28/2023 .    Patient currently requires Min-Mod with basic self-care skills secondary to muscle weakness and muscle joint tightness.  Prior to hospitalization, patient could complete BADL related task  with independent  to ModI.  Patient will benefit from skilled intervention to decrease level of assist with basic self-care skills prior to discharge home with care partner.  Anticipate patient will require 24 hour supervision and follow up home health.  OT - End of Session Activity Tolerance: Tolerates 30+ min activity with multiple rests Endurance Deficit: Yes Endurance Deficit Description: Global deconditioning/weakness OT Assessment Rehab Potential (ACUTE ONLY): Good OT Basic ADL's Functional Problem(s): Dressing;Bathing OT Advanced ADL's Functional Problem(s): Simple Meal Preparation OT Transfers Functional Problem(s): Tub/Shower OT Plan OT Frequency: 5 out of 7 days;Total of 15 hours over 7 days of combined therapies OT Duration/Estimated Length of Stay: 12-14days OT Treatment/Interventions: Therapeutic Exercise;UE/LE Strength taining/ROM;Therapeutic Activities;Self Care/advanced ADL retraining OT Self Feeding Anticipated Outcome(s): s/u OT Basic Self-Care Anticipated Outcome(s): ModI OT Toileting Anticipated Outcome(s): ModI OT Bathroom Transfers Anticipated Outcome(s): ModI OT Recommendation Patient destination: Home Follow Up Recommendations: Home health OT Equipment Recommended: To be determined   OT Evaluation Precautions/Restrictions  Precautions Precautions: Fall Precaution Comments: 2L oxygen via [place]; HOH- No hearing aids Restrictions Weight Bearing Restrictions: No General Chart  Reviewed: Yes Response to Previous Treatment: Patient with no complaints from previous session Family/Caregiver Present: No Vital Signs Therapy Vitals Pulse Rate: 80 BP: (!) 128/58 Patient Position (if appropriate): Sitting Oxygen Therapy SpO2: 98 % O2 Device: Nasal Cannula (2liters) Patient Activity (if Appropriate): In chair Pain Pain Assessment Pain Scale: 0-10 (no pain at the time of eval) PAINAD (Pain Assessment in Advanced Dementia) Negative Vocalization: none Home Living/Prior Functioning Home Living Family/patient expects to be discharged to:: Private residence Living Arrangements: Children Available Help at Discharge: Family, Available 24 hours/day Type of Home: House Home Access: Stairs to enter (5 stairs at the entrance with rails on both sides) Entrance Stairs-Number of Steps: Front stairs: 4-5 steps with R HR; Back stairs: 4-5 steps with R HR Entrance Stairs-Rails: Right (patient indicated 1 at the front entrance and 1 at the back) Home Layout: Two level, Laundry or work area in basement, Able to live on main level with bedroom/bathroom Alternate Level Stairs-Number of Steps: 14- HR on R when descending, L when ascending Alternate Level Stairs-Rails: Left Bathroom Shower/Tub: Engineer, manufacturing systems: Handicapped height Bathroom Accessibility: Yes Additional Comments: At baseline, patient reports being on 2L oxygen via [place] at home.  Lives With: Daughter, Son IADL History Homemaking Responsibilities: No (daughter and son prepare most meals.) Mode of Transportation: Family (Son and daughter drive her to appointments) Occupation: Retired Type of Occupation: Press photographer Leisure and Hobbies: none Prior Function Level of Independence: Independent with transfers, Independent with gait, Independent with homemaking with ambulation, Requires assistive device for independence (simple meal prep)  Able to Take Stairs?: Yes Driving: Yes Vocation: Retired Leisure:  Hobbies-yes (Comment) (cooking and going out to eat) Vision Baseline Vision/History: 1 Wears glasses;4 Cataracts (glasses at home at the time of the assessment.) Ability to See  in Adequate Light: 1 Impaired Patient Visual Report: No change from baseline Vision Assessment?: No apparent visual deficits Perception  Perception: Within Functional Limits Praxis Praxis: Intact Cognition Cognition Overall Cognitive Status: Within Functional Limits for tasks assessed Memory: Appears intact Awareness: Appears intact Problem Solving: Appears intact Safety/Judgment: Appears intact Brief Interview for Mental Status (BIMS) Repetition of Three Words (First Attempt): 3 Temporal Orientation: Year: Correct Temporal Orientation: Month: Accurate within 5 days Temporal Orientation: Day: Correct Recall: "Sock": Yes, no cue required Recall: "Blue": Yes, after cueing ("a color") Recall: "Bed": Yes, no cue required BIMS Summary Score: 14 Sensation Sensation Light Touch: Impaired by gross assessment Additional Comments: Patient reports that ever since she's been sick she has tingling in her feet and her hands which comes and goes. Coordination Fine Motor Movements are Fluid and Coordinated: No (challenges with the fluidy of her movements.) Motor  Motor Motor: Within Functional Limits (patient presents with some joint change impacting the way she approach her environment during functional mobility) Motor - Skilled Clinical Observations: The pt was able to demonstrate AROM/PROM with BUE  Trunk/Postural Assessment  Cervical Assessment Cervical Assessment: Exceptions to [facility] Thoracic Assessment Thoracic Assessment: Exceptions to [facility] Lumbar Assessment Lumbar Assessment: Exceptions to [facility] Postural Control Postural Control: Deficits on evaluation (secondary to muscle weakness impacting good anatomical positioning when coming from sit to stand  and during functional task performance increasing risk for  falls.) Righting Reactions: Delayed secondary to global deconditioning  Balance Static Sitting Balance Static Sitting - Balance Support: Feet supported;No upper extremity supported Static Sitting - Level of Assistance: 5: Stand by assistance (Mod/MaxA with RW in place) Dynamic Sitting Balance Dynamic Sitting - Balance Support: During functional activity;Feet unsupported Dynamic Sitting - Level of Assistance: 5: Stand by assistance (Mod/Max using the RW and hand rails for additional balance) Sitting balance - Comments: good at chair LOF Static Standing Balance Static Standing - Balance Support: Bilateral upper extremity supported (RW/ grab bar) Dynamic Standing Balance Dynamic Standing - Balance Support: During functional activity;Bilateral upper extremity supported (using grab bars and RW for addiitonal balance) Extremity/Trunk Assessment RUE Assessment RUE Assessment: Within Functional Limits Passive Range of Motion (PROM) Comments: WFL Active Range of Motion (AROM) Comments: WFL General Strength Comments: 3-/5 MMT LUE Assessment Passive Range of Motion (PROM) Comments: WFL Active Range of Motion (AROM) Comments: WFL General Strength Comments: 3-5 MMT  Care Tool Care Tool Self Care Eating   Eating Assist Level: Set up assist    Oral Care    Oral Care Assist Level: Set up assist    Bathing   Body parts bathed by patient: Right arm;Left arm;Chest;Abdomen;Front perineal area;Buttocks;Face;Right upper leg;Left upper leg Body parts bathed by helper: Left lower leg;Right lower leg   Assist Level: Minimal Assistance - Patient > 75%    Upper Body Dressing(including orthotics)   What is the patient wearing?: Pull over shirt   Assist Level: Minimal Assistance - Patient > 75%    Lower Body Dressing (excluding footwear)   What is the patient wearing?: Pants;Incontinence brief Assist for lower body dressing: Moderate Assistance - Patient 50 - 74%    Putting on/Taking off footwear    What is the patient wearing?: Non-skid slipper socks Assist for footwear: Maximal Assistance - Patient 25 - 49% (patient reports daughter does socks and shoes)       Care Tool Toileting Toileting activity   Assist for toileting: Moderate Assistance - Patient 50 - 74%     Care Tool Bed Mobility Roll  left and right activity   Roll left and right assist level: Moderate Assistance - Patient 50 - 74%    Sit to lying activity   Sit to lying assist level: Moderate Assistance - Patient 50 - 74%    Lying to sitting on side of bed activity   Lying to sitting on side of bed assist level: the ability to move from lying on the back to sitting on the side of the bed with no back support.: Moderate Assistance - Patient 50 - 74%     Care Tool Transfers Sit to stand transfer   Sit to stand assist level: Maximal Assistance - Patient 25 - 49% (Mod-MaxA)    Chair/bed transfer   Chair/bed transfer assist level: Moderate Assistance - Patient 50 - 74%     Toilet transfer         Care Tool Cognition  Expression of Ideas and Wants Expression of Ideas and Wants: 4. Without difficulty (complex and basic) - expresses complex messages without difficulty and with speech that is clear and easy to understand  Understanding Verbal and Non-Verbal Content Understanding Verbal and Non-Verbal Content: 4. Understands (complex and basic) - clear comprehension without cues or repetitions   Memory/Recall Ability Memory/Recall Ability : Current season;Location of own room;Staff names and faces;That he or she is in a hospital/hospital unit   Refer to Care Plan for Long Term Goals  SHORT TERM GOAL WEEK 1 OT Short Term Goal 1 (Week 1): patient will complete sit to stand using a RW for addiitonal balance at [facility] OT Short Term Goal 2 (Week 1): The pt will complete UB dressing for donning and doffing at MinA  at 95% safe OT Short Term Goal 3 (Week 1): The pt will bathe UB/LB at ModI  using A&E as needed at 95% safe  after demonstration and initial instruction. OT Short Term Goal 4 (Week 1): The pt will complete LB dressing for donning and doffing at MinA  at 95% safe OT Short Term Goal 5 (Week 1): The pt will complete all stand pivot  transfers from one surface to another at [facility]  using the RW and grab bars at 95% safe.  Recommendations for other services: None    Skilled Therapeutic Intervention Patient seen this day for skilled OT assessment. Patient in her room at the time of service delivery seated at w/c LOF.  Patient presented with a good understanding of Occupational Therapy.  The pt was able to complete UB bathing and dressing  at sink LOF with MinA secondary to challenges with managing fasteners and manipulation of clothing items secondary to muscle weakness.  The pt presented as ModA with LB clothing items secondary to the same precipitant.  The pt presented as Mod/MaxA for initially coming from sit to stand using the RW and arm rest during functional task performance secondary to mus weakness placing her at a greater risk for falls.  The pt ambulated to the [facility] using the RW at  [facility] coming from sit to stand and MinA for general hygiene.  Based on my observation of the patient during various BADL related task, in my opinion  she would benefit from skilled OT  services for 12-14 days to maximize her functional outcome and to reduce the burden of care for the care provider.  The pt would also benefit from home health  OT services for effective carryover, as well as, to address concerns related to environmental adaptations.   ADL ADL Eating: Set up Where  Assessed-Eating: Chair;Bed level (based on observation) Grooming: Setup Where Assessed-Grooming: Sitting at sink (based on observation) Upper Body Bathing: Setup Where Assessed-Upper Body Bathing: Sitting at sink;Wheelchair Lower Body Bathing: Minimal assistance Where Assessed-Lower Body Bathing: Sitting at sink Upper Body Dressing: Minimal  assistance Where Assessed-Upper Body Dressing: Wheelchair Lower Body Dressing: Minimal assistance;Moderate assistance Where Assessed-Lower Body Dressing: Wheelchair Toileting: Minimal assistance Where Assessed-Toileting: Bedside Commode Toilet Transfer: Moderate assistance (coming from sit to stand due to gen weakness and stiffness of the joints.) Toilet Transfer Method: Proofreader: Bedside commode (RW) Tub/Shower Transfer: Moderate assistance (ModA coming from sit to stand, but is able to ambulate with CGA to MinA using the RW and grab bars for additional balance.) Tub/Shower Transfer Method: Ambulating Tub/Shower Equipment: Shower seat with back (based on observation) Film/video editor: Not assessed Mobility  Bed Mobility Bed Mobility: Rolling Right;Rolling Left;Supine to Sit;Sit to Supine Rolling Right: Moderate Assistance - Patient 50-74% Rolling Left: Moderate Assistance - Patient 50-74% Supine to Sit: Moderate Assistance - Patient 50-74% Sit to Supine: Moderate Assistance - Patient 50-74% Transfers Sit to Stand:  (ModA to MaxA with very little control when returning to sit. plopping) Stand to Sit: Maximal Assistance - Patient 25-49%   Discharge Criteria: Patient will be discharged from OT if patient refuses treatment 3 consecutive times without medical reason, if treatment goals not met, if there is a change in medical status, if patient makes no progress towards goals or if patient is discharged from hospital.  The above assessment, treatment plan, treatment alternatives and goals were discussed and mutually agreed upon: by patient  Lavona Mound 05/29/2023, 5:35 PM

## 2023-05-29 NOTE — Progress Notes (Signed)
PROGRESS NOTE   Subjective/Complaints: Patient resting comfortably in her bed.  No new concerns or complaints this morning. Last BM today   ROS: Patient denies fever, rash, chest pain, shortness of breath, nausea, vomiting, diarrhea, abdominal pain    Objective:   No results found. Recent Labs    05/27/23 0408 05/28/23 0421  WBC 4.6 6.5  HGB 8.5* 7.9*  HCT 31.3* 28.4*  PLT 81* 79*   Recent Labs    05/27/23 0408 05/28/23 0421  NA 136 136  K 4.6 4.1  CL 97* 96*  CO2 30 30  GLUCOSE 80 83  BUN 16 18  CREATININE 0.94 1.05*  CALCIUM 8.9 9.0    Intake/Output Summary (Last 24 hours) at 05/29/2023 1816 Last data filed at 05/29/2023 1327 Gross per 24 hour  Intake 480 ml  Output --  Net 480 ml        Physical Exam: Vital Signs Blood pressure (!) 128/58, pulse 80, temperature 98 F (36.7 C), resp. rate 16, height 5\' 3"  (1.6 m), weight 75.4 kg, SpO2 98%.  General:  NAD HEENT: Head is normocephalic, atraumatic, membranes moist Neck: Supple without JVD or lymphadenopathy Heart: Reg rate and rhythm  +systolic murmur Chest: frequent exp/insp wheezes, occasional rhonchi. O2 [place] @2L . Breathing non-labored Abdomen: Soft, non-tender, non-distended, bowel sounds positive. Extremities: No clubbing, cyanosis, or edema. Pulses are 2+ Psych: Pleasant, appropriate Skin: Dry without signs of breakdown Neuro:  Alert and oriented x 3. Normal insight and awareness. Intact Memory. Normal language and speech. Cranial nerve exam unremarkable. A little HOH. MMT: UE grossly 4 to 4+/5 prox to distal. LE 3+ prox to 4+/5 ADF/PF. No focal sensory findings. DTR's 1+. No abnl resting tone.   Musculoskeletal: chronic DJD in hands, knees and feet. Minimal joint pain with ROM   Assessment/Plan: 1. Functional deficits which require 3+ hours per day of interdisciplinary therapy in a comprehensive inpatient rehab setting. Physiatrist is  providing close team supervision and 24 hour management of active medical problems listed below. Physiatrist and rehab team continue to assess barriers to discharge/monitor patient progress toward functional and medical goals  Care Tool:  Bathing    Body parts bathed by patient: Right arm, Left arm, Chest, Abdomen, Front perineal area, Buttocks, Face, Right upper leg, Left upper leg   Body parts bathed by helper: Left lower leg, Right lower leg     Bathing assist Assist Level: Minimal Assistance - Patient > 75%     Upper Body Dressing/Undressing Upper body dressing   What is the patient wearing?: Pull over shirt    Upper body assist Assist Level: Minimal Assistance - Patient > 75%    Lower Body Dressing/Undressing Lower body dressing      What is the patient wearing?: Pants, Incontinence brief     Lower body assist Assist for lower body dressing: Moderate Assistance - Patient 50 - 74%     Toileting Toileting    Toileting assist Assist for toileting: Moderate Assistance - Patient 50 - 74%     Transfers Chair/bed transfer  Transfers assist     Chair/bed transfer assist level: Moderate Assistance - Patient 50 - 74%  Locomotion Ambulation   Ambulation assist      Assist level: Minimal Assistance - Patient > 75% Assistive device: Walker-rolling Max distance: 17'   Walk 10 feet activity   Assist     Assist level: Minimal Assistance - Patient > 75% Assistive device: Walker-rolling   Walk 50 feet activity   Assist Walk 50 feet with 2 turns activity did not occur: Safety/medical concerns (Patient unable to ambulate >17' at this time secondary to global deconditioning and weakness)         Walk 150 feet activity   Assist Walk 150 feet activity did not occur: Safety/medical concerns         Walk 10 feet on uneven surface  activity   Assist     Assist level: Minimal Assistance - Patient > 75% Assistive device: Walker-rolling    Wheelchair     Assist Is the patient using a wheelchair?: Yes Type of Wheelchair: Manual    Wheelchair assist level: Supervision/Verbal cueing Max wheelchair distance: 150'    Wheelchair 50 feet with 2 turns activity    Assist        Assist Level: Minimal Assistance - Patient > 75%   Wheelchair 150 feet activity     Assist      Assist Level: Minimal Assistance - Patient > 75%   Blood pressure (!) 128/58, pulse 80, temperature 98 F (36.7 C), resp. rate 16, height 5\' 3"  (1.6 m), weight 75.4 kg, SpO2 98%.   Medical Problem List and Plan: 1. Functional deficits secondary to debility secondary to acute on chronic hypoxic and hypercapnic respiratory failure/COPD exacerbation/CAP/acute diastolic heart failure/acute delirium..  Patient uses supplemental oxygen at baseline at home of 2 L.             -patient may shower             -ELOS/Goals: 7 days, mod I to supervision goals with PT and OT  -Continue CIR 2.  Antithrombotics: -DVT/anticoagulation:  Mechanical: Antiembolism stockings, thigh (TED hose) Bilateral lower extremities             -antiplatelet therapy: N/A 3. Pain Management: Oxycodone 5 mg every 6 hours as needed moderate pain  -7/27 not requiring frequent oxycodone, continue to monitor 4. Mood/Behavior/Sleep: Remeron 15 mg nightly             -pt reports that she continues to struggle with sleep. We discussed melatonin                         -will begin 3mg  melatonin scheduled at bedtime             -antipsychotic agents: N/A 5. Neuropsych/cognition: This patient is capable of making decisions on her own behalf. 6. Skin/Wound Care: Routine skin checks 7. Fluids/Electrolytes/Nutrition: Routine and analysis with follow-up chemistries 8.  Hyperkalemia.  Resolved with aggressive treatments.  Her Lokelma has since been discontinued.  Patient had resumed her home oral dose of Lasix. 9.  Symptomatic chronic hyponatremia.  Nephrology follow-up.  Urine  sodium 75 with 275 urine osmolality.  Plasma sodium normalizing -Follow-up chemistries, monitor intake 10.  Acute HFpEF exacerbation .  Imdur 30 mg daily,  Lasix 20 mg daily, Toprol XL 25 mg daily.  Monitor for any signs of fluid overload.  Daily weights Filed Weights   05/28/23 1511  Weight: 75.4 kg    11.  Chronic orthostasis.  Florinef 0.1 mg daily.  Monitor with increased mobility 12.  MDS/CMML C/B neutropenia and thrombocytopenia.  Followed by hematology services Dr.Katragadda. 13.  Recent GI bleed.  Follow-up CBC on Monday - Protonix  daily for prophylaxis. 14.  Recent admission 6/4 - 6/7 for non-STEMI.  Followed by cardiology services Dr.Mallipeddi 15.  PAF.  Not a candidate for anticoagulation due to history of GI bleed.             -rate presently controlled, regular rhythm  16.  Hypothyroidism.  Synthroid 17.  Hyperlipidemia.  Pravachol    LOS: 1 days A FACE TO FACE EVALUATION WAS PERFORMED  Fanny Dance 05/29/2023, 6:16 PM

## 2023-05-30 NOTE — Progress Notes (Signed)
PROGRESS NOTE   Subjective/Complaints: No acute events overnight.  She does report increased urinary frequency since yesterday.   ROS: Patient denies fever, chills, rash, chest pain, shortness of breath, nausea, vomiting, diarrhea, abdominal pain     Objective:   No results found. Recent Labs    05/28/23 0421  WBC 6.5  HGB 7.9*  HCT 28.4*  PLT 79*   Recent Labs    05/28/23 0421  NA 136  K 4.1  CL 96*  CO2 30  GLUCOSE 83  BUN 18  CREATININE 1.05*  CALCIUM 9.0    Intake/Output Summary (Last 24 hours) at 05/30/2023 1719 Last data filed at 05/30/2023 1313 Gross per 24 hour  Intake 717 ml  Output 300 ml  Net 417 ml        Physical Exam: Vital Signs Blood pressure 125/75, pulse 78, temperature 98.2 F (36.8 C), temperature source Oral, resp. rate 18, height 5\' 3"  (1.6 m), weight 75.4 kg, SpO2 91%.  General:  NAD HEENT: Head is normocephalic, atraumatic, MMM Neck: Supple without JVD or lymphadenopathy Heart: Reg rate and rhythm  +systolic murmur Chest: frequent exp/insp wheezes, occasional rhonchi. O2 Nelson @2L . Breathing non-labored Abdomen: Soft, non-tender, non-distended, bowel sounds positive. Extremities: No clubbing, cyanosis, or edema. Pulses are 2+ Psych: Pleasant, appropriate Skin: Dry without signs of breakdown Neuro: Alert and awake, follows commands,  Normal language and speech. Cranial nerve exam unremarkable. A little HOH. MMT: UE grossly 4 to 4+/5 prox to distal. LE 3+ prox to 4+/5 ADF/PF.  Sensation intact light touch in all 4 extremities. DTR's 1+. No abnl resting tone.   Musculoskeletal: chronic DJD in hands, knees and feet. Minimal joint pain with ROM   Assessment/Plan: 1. Functional deficits which require 3+ hours per day of interdisciplinary therapy in a comprehensive inpatient rehab setting. Physiatrist is providing close team supervision and 24 hour management of active medical  problems listed below. Physiatrist and rehab team continue to assess barriers to discharge/monitor patient progress toward functional and medical goals  Care Tool:  Bathing    Body parts bathed by patient: Right arm, Left arm, Chest, Abdomen, Front perineal area, Buttocks, Face, Right upper leg, Left upper leg   Body parts bathed by helper: Left lower leg, Right lower leg     Bathing assist Assist Level: Minimal Assistance - Patient > 75%     Upper Body Dressing/Undressing Upper body dressing   What is the patient wearing?: Pull over shirt    Upper body assist Assist Level: Minimal Assistance - Patient > 75%    Lower Body Dressing/Undressing Lower body dressing      What is the patient wearing?: Pants, Incontinence brief     Lower body assist Assist for lower body dressing: Moderate Assistance - Patient 50 - 74%     Toileting Toileting    Toileting assist Assist for toileting: Moderate Assistance - Patient 50 - 74%     Transfers Chair/bed transfer  Transfers assist     Chair/bed transfer assist level: Moderate Assistance - Patient 50 - 74%     Locomotion Ambulation   Ambulation assist      Assist level: Minimal Assistance - Patient >  75% Assistive device: Walker-rolling Max distance: 17'   Walk 10 feet activity   Assist     Assist level: Minimal Assistance - Patient > 75% Assistive device: Walker-rolling   Walk 50 feet activity   Assist Walk 50 feet with 2 turns activity did not occur: Safety/medical concerns (Patient unable to ambulate >17' at this time secondary to global deconditioning and weakness)         Walk 150 feet activity   Assist Walk 150 feet activity did not occur: Safety/medical concerns         Walk 10 feet on uneven surface  activity   Assist     Assist level: Minimal Assistance - Patient > 75% Assistive device: Walker-rolling   Wheelchair     Assist Is the patient using a wheelchair?: Yes Type of  Wheelchair: Manual    Wheelchair assist level: Supervision/Verbal cueing Max wheelchair distance: 150'    Wheelchair 50 feet with 2 turns activity    Assist        Assist Level: Minimal Assistance - Patient > 75%   Wheelchair 150 feet activity     Assist      Assist Level: Minimal Assistance - Patient > 75%   Blood pressure 125/75, pulse 78, temperature 98.2 F (36.8 C), temperature source Oral, resp. rate 18, height 5\' 3"  (1.6 m), weight 75.4 kg, SpO2 91%.   Medical Problem List and Plan: 1. Functional deficits secondary to debility secondary to acute on chronic hypoxic and hypercapnic respiratory failure/COPD exacerbation/CAP/acute diastolic heart failure/acute delirium..  Patient uses supplemental oxygen at baseline at home of 2 L.             -patient may shower             -ELOS/Goals: 7 days, mod I to supervision goals with PT and OT  -Continue CIR 2.  Antithrombotics: -DVT/anticoagulation:  Mechanical: Antiembolism stockings, thigh (TED hose) Bilateral lower extremities             -antiplatelet therapy: N/A 3. Pain Management: Oxycodone 5 mg every 6 hours as needed moderate pain  -7/28 pain controlled, not using oxycodone frequently 4. Mood/Behavior/Sleep: Remeron 15 mg nightly             -pt reports that she continues to struggle with sleep. We discussed melatonin                         -will begin 3mg  melatonin scheduled at bedtime             -antipsychotic agents: N/A 5. Neuropsych/cognition: This patient is capable of making decisions on her own behalf. 6. Skin/Wound Care: Routine skin checks 7. Fluids/Electrolytes/Nutrition: Routine and analysis with follow-up chemistries 8.  Hyperkalemia.  Resolved with aggressive treatments.  Her Lokelma has since been discontinued.  Patient had resumed her home oral dose of Lasix. -Recheck labs Monday 9.  Symptomatic chronic hyponatremia.  Nephrology follow-up.  Urine sodium 75 with 275 urine osmolality.  Plasma  sodium normalizing -Follow-up chemistries, monitor intake -7/28 Recheck tomorrow 10.  Acute HFpEF exacerbation .  Imdur 30 mg daily,  Lasix 20 mg daily, Toprol XL 25 mg daily.  Monitor for any signs of fluid overload.  Daily weights  -7/28 Will asked nursing to check weights Filed Weights   05/28/23 1511  Weight: 75.4 kg    11.  Chronic orthostasis.  Florinef 0.1 mg daily.  Monitor with increased mobility 12.  MDS/CMML C/B neutropenia  and thrombocytopenia.  Followed by hematology services Dr.Katragadda. 13.  Recent GI bleed.  Follow-up CBC on Monday - Protonix  daily for prophylaxis. 14.  Recent admission 6/4 - 6/7 for non-STEMI.  Followed by cardiology services Dr.Mallipeddi 15.  PAF.  Not a candidate for anticoagulation due to history of GI bleed.             -HR controlled    05/30/2023    1:00 PM 05/30/2023    8:06 AM 05/30/2023    4:44 AM  Vitals with BMI  Systolic 125 135 660  Diastolic 75 71 62  Pulse 78 81 79    16.  Hypothyroidism.  Synthroid 17.  Hyperlipidemia.  Pravachol 18.  Urinary frequency.   -UA and reflex culture ordered    LOS: 2 days A FACE TO FACE EVALUATION WAS PERFORMED  Fanny Dance 05/30/2023, 5:19 PM

## 2023-05-31 DIAGNOSIS — R5381 Other malaise: Secondary | ICD-10-CM | POA: Diagnosis not present

## 2023-05-31 MED ORDER — POTASSIUM CHLORIDE 20 MEQ PO PACK: 40.0000 meq | PACK | Freq: Two times a day (BID) | ORAL | Status: DC

## 2023-05-31 MED ORDER — FUROSEMIDE 20 MG PO TABS
10.0000 mg | ORAL_TABLET | Freq: Every day | ORAL | Status: DC
  Administered 2023-06-01: 10 mg via ORAL
  Filled 2023-05-31: qty 1

## 2023-05-31 MED ORDER — POTASSIUM CHLORIDE 20 MEQ PO PACK
40.0000 meq | PACK | Freq: Once | ORAL | Status: AC
  Administered 2023-05-31: 40 meq via ORAL
  Filled 2023-05-31: qty 2

## 2023-05-31 NOTE — Progress Notes (Addendum)
Met with patient. Very tired.  Uses oxygen at home. Didn't see IS or flutter valve in room. Brought her IS and had her use it. Was able to reach 1000 the first time and last 5 were around 750. No changes in medications at this time. Was checking daily weights at home.  Provided CHF and action plan for COPD as well.  Informed of team conference on every Wednesday and will discuss goals, discharge date,  barriers, and if equipment needed.  SW to follow up.  On day of discharge date we like family to be here at 8am and PA will come in and go over discharge medications, instructions, and follow up appointments. Not sleeping well. Reports that when in therapy had a "knot" in her chest and has resolved now.  Asked if was indigestion and said probably. No family in room. All needs met.

## 2023-05-31 NOTE — Progress Notes (Signed)
Inpatient Rehabilitation Care Coordinator Assessment and Plan Patient Details  Name: Lisa [name] MRN: [id number] Date of Birth: 28-Apr-1934  Today's Date: 05/31/2023  Hospital Problems: Principal Problem:   Debility  Past Medical History:  Past Medical History:  Diagnosis Date   Adenocarcinoma of left lung (HCC) 2006   Arthritis    Asthma    Cancer of breast, female (HCC)    Cancer of lung (HCC)    Dysrhythmia    Hypertension    Hypothyroidism    Invasive ductal carcinoma of left breast (HCC) 1999   Myocardial infarction (HCC)    Neutropenia (HCC) 06/09/2016   Personal history of radiation therapy    Past Surgical History:  Past Surgical History:  Procedure Laterality Date   BIOPSY  10/23/2020   Procedure: BIOPSY;  Surgeon: Benancio Deeds, MD;  Location: [facility] ENDOSCOPY;  Service: Gastroenterology;;   BREAST BIOPSY     left axillary node dissection   BREAST LUMPECTOMY Left    COLONOSCOPY  03/08/2003   HKV:[id number] rectal polyps destroyed with the tip of the snare/Polyps at hepatic flexure, splenic flexure at 35 cm/Left-sided diverticula: unable to retrieve path   COLONOSCOPY  09/12/2008   IEP:PIRJJO rectum and distal sigmoid diminutive polyps/scattered left sided diverticulum. hyperplastic   COLONOSCOPY N/A 03/06/2013   ACZ:[id number] polyp-removed as described above; colonic diverticulosis. hyperplastic polyps. next TCS 03/2018   ESOPHAGOGASTRODUODENOSCOPY (EGD) WITH PROPOFOL N/A 10/23/2020   Procedure: ESOPHAGOGASTRODUODENOSCOPY (EGD) WITH PROPOFOL;  Surgeon: Benancio Deeds, MD;  Location: [facility] ENDOSCOPY;  Service: Gastroenterology;  Laterality: N/A;   FOOT SURGERY     LUNG REMOVAL, PARTIAL     upper lobe   Social History:  reports that she quit smoking about 31 years ago. Her smoking use included cigarettes. She started smoking about 66 years ago. She has a 26.3 pack-year smoking history. She has never used smokeless tobacco. She reports that she does not drink  alcohol and does not use drugs.  Family / Support Systems Marital Status: Single Patient Roles: Parent Spouse/Significant Other: n/a Children: Eddie, Son. Daughter, Steward Drone Other Supports: Elnita Maxwell [facility]- POA) Anticipated Caregiver: Steward Drone and Alden Server (Primary Caregivers) and Frederik Schmidt (POA- OOS) Ability/Limitations of Caregiver: None Caregiver Availability: 24/7 Family Dynamics: supportive family  Social History Preferred language: English Religion: Baptist Cultural Background: Lives at home with son and daughter Education: HS Health Literacy - How often do you need to have someone help you when you read instructions, pamphlets, or other written material from your doctor or pharmacy?: Sometimes Writes: Yes Legal History/Current Legal Issues: n/a Guardian/Conservator: Georgiana Shore   Abuse/Neglect Abuse/Neglect Assessment Can Be Completed: Yes Physical Abuse: Denies Verbal Abuse: Denies Sexual Abuse: Denies Exploitation of patient/patient's resources: Denies Self-Neglect: Denies  Patient response to: Social Isolation - How often do you feel lonely or isolated from those around you?: Never  Emotional Status Pt's affect, behavior and adjustment status: Pleasant Recent Psychosocial Issues: Coping Psychiatric History: n/a Substance Abuse History: n/a  Patient / Family Perceptions, Expectations & Goals Pt/Family understanding of illness & functional limitations: yes Premorbid pt/family roles/activities: MOD I with RW. Family assisted with ADLS. Anticipated changes in roles/activities/participation: Family (son and daughter)  plans to assist patient 24/7, Pt/family expectations/goals: Supervision-MOD I  [employer]: None Premorbid Home Care/DME Agencies: Other (Comment) (RW, SPC, BSC, WC and Lift) Transportation available at discharge: Children Is the patient able to respond to transportation needs?: Yes In the past 12 months, has lack of  transportation kept you from medical appointments or from  getting medications?: No Resource referrals recommended: Neuropsychology  Discharge Planning Living Arrangements: Children Support Systems: Spouse/significant other, Other relatives Type of Residence: Private residence Insurance Resources: [employer] Financial Resources: Family Support Financial Screen Referred: No Living Expenses: Lives with family Money Management: Patient, Family Does the patient have any problems obtaining your medications?: No Home Management: Independent Patient/Family Preliminary Plans: children able to assist Care Coordinator Barriers to Discharge: Decreased caregiver support, Lack of/limited family support, Insurance for SNF coverage Care Coordinator Anticipated Follow Up Needs: HH/OP Expected length of stay: 7 days  Clinical Impression SW met with patient, introduced self and explained role. Sw called grandson (poa), [place] at bedside. Grandson currently in clinic and requesting SW to text contact information, information sent.   Patient discharging home with 24/7 supervision and assistance from pt son and daughter. Patient uses a chair lift at home. No additional questions or concerns, Sw will reach out with update son Wednesdays.   Andria Rhein 05/31/2023, 1:06 PM

## 2023-05-31 NOTE — IPOC Note (Signed)
Overall Plan of Care [facility]) Patient Details Name: Lisa [name] MRN: [id number] DOB: [birth year]  Admitting Diagnosis: Debility  Hospital Problems: Principal Problem:   Debility     Functional Problem List: Nursing Safety, Endurance, Medication Management, Bowel  PT Balance, Safety, Sensory, Endurance, Motor  OT    SLP    TR         Basic ADL's: OT Dressing, Bathing     Advanced  ADL's: OT Simple Meal Preparation     Transfers: PT Bed Mobility, Bed to Chair, Car, Furniture  OT Tub/Shower     Locomotion: PT Ambulation, Psychologist, prison and probation services, Stairs     Additional Impairments: OT    SLP        TR      Anticipated Outcomes Item Anticipated Outcome  Self Feeding s/u  Swallowing      Basic self-care  ModI  Toileting  ModI   Bathroom Transfers ModI  Bowel/Bladder  continent bowel and bladder  Transfers  ModI with LRAD  Locomotion  ModI with LRAD for household distances  Communication     Cognition     Pain  less than 3  Safety/Judgment  no falls   Therapy Plan: PT Intensity: Minimum of 1-2 x/day ,45 to 90 minutes PT Frequency: 5 out of 7 days PT Duration Estimated Length of Stay: 12-14 days OT Frequency: 5 out of 7 days, Total of 15 hours over 7 days of combined therapies OT Duration/Estimated Length of Stay: 12-14days     Team Interventions: Nursing Interventions Patient/Family Education, Bowel Management, Medication Management, Discharge Planning, Disease Management/Prevention  PT interventions Ambulation/gait training, DME/adaptive equipment instruction, Community reintegration, Psychosocial support, Neuromuscular re-education, Stair training, Wheelchair propulsion/positioning, UE/LE Strength taining/ROM, Warden/ranger, Discharge planning, Pain management, Functional electrical stimulation, Therapeutic Activities, UE/LE Coordination activities, Cognitive remediation/compensation, Disease management/prevention, Functional mobility  training, Patient/family education, Splinting/orthotics, Therapeutic Exercise, Visual/perceptual remediation/compensation  OT Interventions Therapeutic Exercise, UE/LE Strength taining/ROM, Therapeutic Activities, Self Care/advanced ADL retraining  SLP Interventions    TR Interventions    SW/CM Interventions     Barriers to Discharge MD  Medical stability  Nursing Decreased caregiver support, Home environment access/layout home with daughter 2 level with 4 ste, B/B main level  PT Home environment Best boy, Community education officer for SNF coverage    OT      SLP      SW       Team Discharge Planning: Destination: PT-Home ,OT- Home , SLP-  Projected Follow-up: PT-Outpatient PT, OT-  Home health OT, SLP-  Projected Equipment Needs: PT-To be determined, OT- To be determined, SLP-  Equipment Details: PT-TBD, OT-  Patient/family involved in discharge planning: PT- Patient,  OT-Family member/caregiver, SLP-   MD ELOS: 7-10 days Medical Rehab Prognosis:  Excellent Assessment: The patient has been admitted for CIR therapies with the diagnosis of CVA,  The team will be addressing functional mobility, strength, stamina, balance, safety, adaptive techniques and equipment, self-care, bowel and bladder mgt, patient and caregiver education. Goals have been set at modI/supervision. Anticipated discharge destination is home.     See Team Conference Notes for weekly updates to the plan of care

## 2023-05-31 NOTE — IPOC Note (Addendum)
Overall Plan of Care [facility]) Patient Details Name: Lisa [name] MRN: [id number] DOB: September 13, 1934  Admitting Diagnosis: Debility  Hospital Problems: Principal Problem:   Debility     Functional Problem List: Nursing Safety, Endurance, Medication Management, Bowel  PT Balance, Safety, Sensory, Endurance, Motor  OT    SLP    TR         Basic ADL's: OT Dressing, Bathing     Advanced  ADL's: OT Simple Meal Preparation     Transfers: PT Bed Mobility, Bed to Chair, Car, Furniture  OT Tub/Shower     Locomotion: PT Ambulation, Psychologist, prison and probation services, Stairs     Additional Impairments: OT    SLP        TR      Anticipated Outcomes Item Anticipated Outcome  Self Feeding s/u  Swallowing      Basic self-care  ModI  Toileting  ModI   Bathroom Transfers ModI  Bowel/Bladder  continent bowel and bladder  Transfers  ModI with LRAD  Locomotion  ModI with LRAD for household distances  Communication     Cognition     Pain  less than 3  Safety/Judgment  no falls   Therapy Plan: PT Intensity: Minimum of 1-2 x/day ,45 to 90 minutes PT Frequency: 5 out of 7 days PT Duration Estimated Length of Stay: 12-14 days OT Frequency: 5 out of 7 days, Total of 15 hours over 7 days of combined therapies OT Duration/Estimated Length of Stay: 12-14days Ot intensity : min of 1-2 x say , 45 to 90 min     Team Interventions: Nursing Interventions Patient/Family Education, Bowel Management, Medication Management, Discharge Planning, Disease Management/Prevention  PT interventions Ambulation/gait training, DME/adaptive equipment instruction, Community reintegration, Psychosocial support, Neuromuscular re-education, Museum/gallery curator, Wheelchair propulsion/positioning, UE/LE Strength taining/ROM, Warden/ranger, Discharge planning, Pain management, Functional electrical stimulation, Therapeutic Activities, UE/LE Coordination activities, Cognitive remediation/compensation,  Disease management/prevention, Functional mobility training, Patient/family education, Splinting/orthotics, Therapeutic Exercise, Visual/perceptual remediation/compensation  OT Interventions Therapeutic Exercise, UE/LE Strength taining/ROM, Therapeutic Activities, Self Care/advanced ADL retraining  SLP Interventions    TR Interventions    SW/CM Interventions     Barriers to Discharge MD  Medical stability  Nursing Decreased caregiver support, Home environment access/layout home with daughter 2 level with 4 ste, B/B main level  PT Home environment Best boy, Community education officer for SNF coverage    OT      SLP      SW       Team Discharge Planning: Destination: PT-Home ,OT- Home , SLP-  Projected Follow-up: PT-Outpatient PT, OT-  Home health OT, SLP-  Projected Equipment Needs: PT-To be determined, OT- To be determined, SLP-  Equipment Details: PT-TBD, OT-  Patient/family involved in discharge planning: PT- Patient,  OT-Family member/caregiver, SLP-   MD ELOS: 7 days Medical Rehab Prognosis:  Excellent Assessment: The patient has been admitted for CIR therapies with the diagnosis of debility.The team will be addressing functional mobility, strength, stamina, balance, safety, adaptive techniques and equipment, self-care, bowel and bladder mgt, patient and caregiver education. Goals have been set at modI/supervision. Anticipated discharge destination is home.        See Team Conference Notes for weekly updates to the plan of care

## 2023-05-31 NOTE — Progress Notes (Signed)
Occupational Therapy Session Note  Patient Details  Name: Lisa [name] MRN: [id number] Date of Birth: [birth year]  Today's Date: 05/31/2023 OT Individual Time: 1050-1200 OT Individual Time Calculation (min): 70 min    Short Term Goals: Week 1:  OT Short Term Goal 1 (Week 1): patient will complete sit to stand using a RW for addiitonal balance at [employer] OT Short Term Goal 2 (Week 1): The pt will complete UB dressing for donning and doffing at [facility]  at 95% safe OT Short Term Goal 3 (Week 1): The pt will bathe UB/LB at ModI  using A&E as needed at 95% safe after demonstration and initial instruction. OT Short Term Goal 4 (Week 1): The pt will complete LB dressing for donning and doffing at [facility]  at 95% safe OT Short Term Goal 5 (Week 1): The pt will complete all stand pivot  transfers from one surface to another at [facility]  using the RW and grab bars at 95% safe.  Skilled Therapeutic Interventions/Progress Updates:  Skilled OT intervention completed with focus on functional transfers, blocked practice sit > stands, activity tolerance. Pt received seated in w/c, agreeable to session. No pain reported.  Pt politely declined self-care needs at this time. Transported pt dependently in w/c > gym for time and energy conservation. Pt attempted to stand on first trial however with inefficient anterior weight shifting with suspected max A needed therefore had pt trial anterior leans with cue of "nose over toes" x5 in prep for stand. With rocking method, pt able to stand using RW with both hands on RW with heavy min A. Mod verbal cues needed for min A stand pivot to EOM, with tactile cues needed for command following of stepping back to EOM prior to sitting.  Education provided about importance of backing up to seat with point of contact and back of knees along with reaching back prior to sitting for safety. From mat at same height as w/c, attempted to stand for blocked practice however pt with increased fear  per report of leaning forward. Practice anterior weight shifting again x5 with tactile cues needed, with 3 attempts to power up using RW however pt with noted resistance in BUE despite anterior lean.   Mat height raised, and mirror utilized for visual feedback and promotion of upright gaze, then pt was able to stand x2 with heavy min A with max cues for BLE positioning and posture. Pt has suspected baseline method of knee valgus to power up vs wide BOS as when feet are closer and knees touch, pt can stand with much less assist.   Upon standing pt verbalized dizziness, with seated and standing vitals assessed as seen below:  Vitals BP 131/69 (seated), HR 74 bpm, O2 93% on 2L BP 149/85 (standing), HR 191 bpm, O2 92% on 2L HR 75 bpm (seated after rest), O2 92% on 2L O2 remained 92% or greater on 2L at end of session  MD, PA and nursing notified of elevated HR with mobility. When asked if pt had any other symptoms than dizziness, she did report chest pain after first PT session and expressed that she did notify nursing but that it passed, however after verifying with nursing due to pt's cog deficits, nurse had not been made aware.   Assisted pt with min A sit > stand and stand pivot using RW back to w/c, then in room, completed again to EOB. Mod A to doff shoes, then mod A for bed mobility. Pt remained semi upright  in bed, with bed alarm on/activated, and with all needs in reach at end of session.   Therapy Documentation Precautions:  Precautions Precautions: Fall Precaution Comments: 2L oxygen via [place]; HOH- No hearing aids Restrictions Weight Bearing Restrictions: No    Therapy/Group: Individual Therapy  Melvyn Novas, MS, OTR/L  05/31/2023, 12:09 PM

## 2023-05-31 NOTE — Progress Notes (Signed)
Inpatient Rehabilitation Center Individual Statement of Services  Patient Name:  Lisa [name]  Date:  05/31/2023  Welcome to the Inpatient Rehabilitation Center.  Our goal is to provide you with an individualized program based on your diagnosis and situation, designed to meet your specific needs.  With this comprehensive rehabilitation program, you will be expected to participate in at least 3 hours of rehabilitation therapies Monday-Friday, with modified therapy programming on the weekends.  Your rehabilitation program will include the following services:  Physical Therapy (PT), Occupational Therapy (OT), Speech Therapy (ST), 24 hour per day rehabilitation nursing, Therapeutic Recreaction (TR), Care Coordinator, Rehabilitation Medicine, Nutrition Services, Pharmacy Services, and Other  Weekly team conferences will be held on Wednesdays to discuss your progress.  Your Inpatient Rehabilitation Care Coordinator will talk with you frequently to get your input and to update you on team discussions.  Team conferences with you and your family in attendance may also be held.  Expected length of stay: 7 Days  Overall anticipated outcome:  MOD I to Supervision  Depending on your progress and recovery, your program may change. Your Inpatient Rehabilitation Care Coordinator will coordinate services and will keep you informed of any changes. Your Inpatient Rehabilitation Care Coordinator's name and contact numbers are listed  below.  The following services may also be recommended but are not provided by the Inpatient Rehabilitation Center:   Home Health Rehabiltiation Services Outpatient Rehabilitation Services    Arrangements will be made to provide these services after discharge if needed.  Arrangements include referral to agencies that provide these services.  Your insurance has been verified to be:  Medicare A & B Your primary doctor is:  Nita Sells, MD  Pertinent information will be shared with  your doctor and your insurance company.  Inpatient Rehabilitation Care Coordinator:  Lavera Guise, [place] [phone] or [phone]  Information discussed with and copy given to patient by: Andria Rhein, 05/31/2023, 9:28 AM

## 2023-05-31 NOTE — Progress Notes (Signed)
Physical Therapy Session Note  Patient Details  Name: Lisa [name] MRN: [id number] Date of Birth: [birth year]  Today's Date: 05/31/2023 PT Individual Time: [phone] and 1300-1356 PT Individual Time Calculation (min): 69 min and 56 min  Short Term Goals: Week 1:  PT Short Term Goal 1 (Week 1): Patient will perform bed mobility with MinA PT Short Term Goal 2 (Week 1): Patient will perform sit/stand with LRAD and MinA consistently PT Short Term Goal 3 (Week 1): Patient will ambulate >50' with LRAD and MinA  Skilled Therapeutic Interventions/Progress Updates:   Treatment Session 1 Received pt sitting upright in bed with RN present at bedside. Pt agreeable to PT treatment and denied any pain during session - of note, pt HOH without hearing aids. Session with emphasis on functional mobility/transfers, dressing, generalized strengthening and endurance, dynamic standing balance/coordination, and gait training. Pt on 2L O2 with SPO2 >92% throughout session - pt on oxygen prior to admission. Pt transferred sitting upright<>sitting EOB with HOB elevated and use of bedrails with supervision. Donned pants sitting EOB with supervision, shoes with max A, doffed nightgown and donned pull over shirt with supervision, and sweater with min A. Pt unable to stand from low EOB despite max A, but able to stand from elevated EOB (to simulate bed height at home) with RW and mod A and required mod A to pull pants over hips. Pt performed stand<>pivot transfer into WC with RW and min A (noted extremely flexed trunk during transfer). MD arrived for morning rounds and pt performed WC mobility ~165ft using BUE and supervision with increased time to dayroom - emphasis on UE strength.  Stood from [facility] with RW and mod A and ambulated 68ft with RW and min A fading to CGA. Took seated rest break, then stood from chair with RW and mod A and ambulated additional 66ft with RW and CGA - cues to bring RW all the way back prior to sitting.  Stood from [facility] and performed standing alternating toe taps to 3in step 2x10 bilaterally with BUE support on RW and CGA for balance. Pt transferred mat<>WC stand<>pivot with RW and CGA (mod A to stand) and transported back to room in [facility] dependently. Pt reports living with disabled daughter and son who are available to provide 24/7 assist upon D/C. Pt also reports having 5 STE with 1 handrail and will need to practice stair navigation prior to D/C. Concluded session with pt sitting in WC, needs within reach, and seatbelt alarm on.   Treatment Session 2 Received pt semi-reclined in bed placing lunch/dinner orders - required increased time due to hearing difficulties. Per RN, pt already received EKG and currently cleared to continue with therapy. Pt agreeable to PT treatment and denied any pain during session. Session with emphasis on functional mobility/transfers, generalized strengthening and endurance, dynamic standing balance/coordination, toileting, and gait training. Pt on 2L O2 with SPO2 >94% throughout session. Pt reported urge to void and transferred semi-reclined<>sitting EOB with HOB elevated and use of bedrails with supervision. Donned shoes with max A and stood from elevated EOB with RW x 2 trials with mod A. Pt ambulated in/out of bathroom with RW and min A (cues to navigate threshold) and required total A for clothing management. Pt continent of bladder and performed hygiene management without assist. Stood from toilet with bedside commode over top with RW and min A and returned to [facility]. Sat in WC at sink and washed hands with set up assist and requested to remain in room rather  than going to gym this afternoon. Pt performed the following exercises with emphasis on LE strength/ROM:  -seated hip abduction with red TB 2x15 -seated hip flexion with red TB 2x15 bilaterally  -seated knee extension 2x15 bilaterally -seated hamstring curls with red TB 2x15 bilaterally Pt then performed sit<>stands x3 reps  with heavy mod A - emphasis on technique, hand placement, and eccentric control when sitting (limited by fatigue). Concluded session with pt sitting in WC, needs within reach, and seatbelt alarm on. NT present at bedside checking vitals.   Vitals: Supine at rest: HR 86bpm and SPO2 100% Standing: HR ranging from 39-68bpm and SPO2 100% - pt reported feeling "nervous" Sitting after ambulating from bathroom: HR 108bpm and SPO2 96% Sitting at end of session: HR 98bpm and SPO2 94%  Therapy Documentation Precautions:  Precautions Precautions: Fall Precaution Comments: 2L oxygen via [place]; HOH- No hearing aids Restrictions Weight Bearing Restrictions: No  Therapy/Group: Individual Therapy [name] PT, DPT 05/31/2023, 7:09 AM

## 2023-05-31 NOTE — Progress Notes (Signed)
Patient did not sleep during the night with melatonin

## 2023-05-31 NOTE — Progress Notes (Signed)
Inpatient Rehabilitation  Patient information reviewed and entered into eRehab system by Kelly Gentry, OTR/L, Rehab Quality Coordinator.   Information including medical coding, functional ability and quality indicators will be reviewed and updated through discharge.   

## 2023-05-31 NOTE — Progress Notes (Signed)
PROGRESS NOTE   Subjective/Complaints: No new complaints this morning +urinary frequency, discussed this is likely due to lasix. Legs are non-edematous, discussed we can decrease lasix to 10mg    ROS: Patient denies fever, chills, rash, chest pain, shortness of breath, nausea, vomiting, diarrhea, abdominal pain, +urinary frequency  Objective:   No results found. No results for input(s): "WBC", "HGB", "HCT", "PLT" in the last 72 hours.  Recent Labs    05/31/23 0735  NA 138  K 3.0*  CL 94*  CO2 35*  GLUCOSE 105*  BUN 15  CREATININE 1.08*  CALCIUM 9.0    Intake/Output Summary (Last 24 hours) at 05/31/2023 1054 Last data filed at 05/31/2023 0820 Gross per 24 hour  Intake 474 ml  Output --  Net 474 ml        Physical Exam: Vital Signs Blood pressure (!) 124/59, pulse 73, temperature 98.7 F (37.1 C), temperature source Oral, resp. rate 18, height 5\' 3"  (1.6 m), weight 71.6 kg, SpO2 93%.  General:  NAD, BMI 27.96 HEENT: Head is normocephalic, atraumatic, MMM Neck: Supple without JVD or lymphadenopathy Heart: Reg rate and rhythm  +systolic murmur Chest: frequent exp/insp wheezes, occasional rhonchi. O2 [place] @2L . Breathing non-labored Abdomen: Soft, non-tender, non-distended, bowel sounds positive. Extremities: No clubbing, cyanosis, or edema. Pulses are 2+ Psych: Pleasant, appropriate Skin: Dry without signs of breakdown Neuro: Alert and awake, follows commands,  Normal language and speech. Cranial nerve exam unremarkable. A little HOH. MMT: UE grossly 4 to 4+/5 prox to distal. LE 3+ prox to 4+/5 ADF/PF.  Sensation intact light touch in all 4 extremities. DTR's 1+. No abnl resting tone.   Musculoskeletal: chronic DJD in hands, knees and feet. Minimal joint pain with ROM   Assessment/Plan: 1. Functional deficits which require 3+ hours per day of interdisciplinary therapy in a comprehensive inpatient rehab  setting. Physiatrist is providing close team supervision and 24 hour management of active medical problems listed below. Physiatrist and rehab team continue to assess barriers to discharge/monitor patient progress toward functional and medical goals  Care Tool:  Bathing    Body parts bathed by patient: Right arm, Left arm, Chest, Abdomen, Front perineal area, Buttocks, Face, Right upper leg, Left upper leg   Body parts bathed by helper: Left lower leg, Right lower leg     Bathing assist Assist Level: Minimal Assistance - Patient > 75%     Upper Body Dressing/Undressing Upper body dressing   What is the patient wearing?: Pull over shirt    Upper body assist Assist Level: Minimal Assistance - Patient > 75%    Lower Body Dressing/Undressing Lower body dressing      What is the patient wearing?: Pants, Incontinence brief     Lower body assist Assist for lower body dressing: Moderate Assistance - Patient 50 - 74%     Toileting Toileting    Toileting assist Assist for toileting: Moderate Assistance - Patient 50 - 74%     Transfers Chair/bed transfer  Transfers assist     Chair/bed transfer assist level: Minimal Assistance - Patient > 75%     Locomotion Ambulation   Ambulation assist      Assist level:  Minimal Assistance - Patient > 75% Assistive device: Walker-rolling Max distance: 17'   Walk 10 feet activity   Assist     Assist level: Minimal Assistance - Patient > 75% Assistive device: Walker-rolling   Walk 50 feet activity   Assist Walk 50 feet with 2 turns activity did not occur: Safety/medical concerns (Patient unable to ambulate >17' at this time secondary to global deconditioning and weakness)         Walk 150 feet activity   Assist Walk 150 feet activity did not occur: Safety/medical concerns         Walk 10 feet on uneven surface  activity   Assist     Assist level: Minimal Assistance - Patient > 75% Assistive device:  Walker-rolling   Wheelchair     Assist Is the patient using a wheelchair?: Yes Type of Wheelchair: Manual    Wheelchair assist level: Supervision/Verbal cueing Max wheelchair distance: 177ft    Wheelchair 50 feet with 2 turns activity    Assist        Assist Level: Supervision/Verbal cueing   Wheelchair 150 feet activity     Assist      Assist Level: Minimal Assistance - Patient > 75%   Blood pressure (!) 124/59, pulse 73, temperature 98.7 F (37.1 C), temperature source Oral, resp. rate 18, height 5\' 3"  (1.6 m), weight 71.6 kg, SpO2 93%.   Medical Problem List and Plan: 1. Functional deficits secondary to debility secondary to acute on chronic hypoxic and hypercapnic respiratory failure/COPD exacerbation/CAP/acute diastolic heart failure/acute delirium..  Patient uses supplemental oxygen at baseline at home of 2 L.             -patient may shower             -ELOS/Goals: 7 days, mod I to supervision goals with PT and OT  -Continue CIR  IPOC completed 2.  Antithrombotics: -DVT/anticoagulation:  Mechanical: Antiembolism stockings, thigh (TED hose) Bilateral lower extremities             -antiplatelet therapy: N/A 3. Pain Management: Oxycodone 5 mg every 6 hours as needed moderate pain  -7/28 pain controlled, not using oxycodone frequently 4. Mood/Behavior/Sleep: Remeron 15 mg nightly             -pt reports that she continues to struggle with sleep. We discussed melatonin                         -will begin 3mg  melatonin scheduled at bedtime             -antipsychotic agents: N/A 5. Neuropsych/cognition: This patient is capable of making decisions on her own behalf. 6. Skin/Wound Care: Routine skin checks 7. Fluids/Electrolytes/Nutrition: Routine and analysis with follow-up chemistries 8.  Hyperkalemia.  Resolved with aggressive treatments.  Her Lokelma has since been discontinued.  Patient had resumed her home oral dose of Lasix. -Recheck labs Monday 9.   Symptomatic chronic hyponatremia.  Nephrology follow-up.  Urine sodium 75 with 275 urine osmolality.  Plasma sodium normalizing -Follow-up chemistries, monitor intake -7/28 Recheck tomorrow 10.  Acute HFpEF exacerbation .  Imdur 30 mg daily,  Lasix 20 mg daily, Toprol XL 25 mg daily.  Monitor for any signs of fluid overload.  Daily weights  -7/28 Will asked nursing to check weights Filed Weights   05/28/23 1511 05/30/23 0718 05/31/23 0702  Weight: 75.4 kg 71.6 kg 71.6 kg    11.  Chronic orthostasis.  Florinef  0.1 mg daily.  Monitor with increased mobility 12.  MDS/CMML C/B neutropenia and thrombocytopenia.  Followed by hematology services Dr.Katragadda. 13.  Recent GI bleed.  Follow-up CBC on Monday - Protonix  daily for prophylaxis. 14.  Recent admission 6/4 - 6/7 for non-STEMI.  Followed by cardiology services Dr.Mallipeddi 15.  PAF.  Not a candidate for anticoagulation due to history of GI bleed.             -HR controlled    05/31/2023    8:25 AM 05/31/2023    7:02 AM 05/31/2023    3:21 AM  Vitals with BMI  Weight  157 lbs 14 oz   BMI  27.97   Systolic 124  106  Diastolic 59  74  Pulse 73  59    16.  Hypothyroidism.  Synthroid 17.  Hyperlipidemia.  Pravachol 18.  Urinary frequency.   -UA and reflex culture ordered, discussed that UA is negative  19. Hypotension: decrease lasix to 10mg   20. Hypokalemia: klor ordered     I spent >60mins performing patient care related activities, including face to face time, documentation time, med management, discussion of meds and lab orders with patient, and overall coordination of care.    LOS: 3 days A FACE TO FACE EVALUATION WAS PERFORMED  Drema Pry Berlie Persky 05/31/2023, 10:54 AM

## 2023-06-01 ENCOUNTER — Encounter (HOSPITAL_COMMUNITY): Payer: Self-pay | Admitting: Physical Medicine and Rehabilitation

## 2023-06-01 DIAGNOSIS — I350 Nonrheumatic aortic (valve) stenosis: Secondary | ICD-10-CM

## 2023-06-01 DIAGNOSIS — I4819 Other persistent atrial fibrillation: Secondary | ICD-10-CM | POA: Diagnosis not present

## 2023-06-01 DIAGNOSIS — R5381 Other malaise: Secondary | ICD-10-CM | POA: Diagnosis not present

## 2023-06-01 LAB — BASIC METABOLIC PANEL
Anion gap: 10 (ref 5–15)
BUN: 19 mg/dL (ref 8–23)
CO2: 32 mmol/L (ref 22–32)
Calcium: 8.6 mg/dL — ABNORMAL LOW (ref 8.9–10.3)
Chloride: 93 mmol/L — ABNORMAL LOW (ref 98–111)
Creatinine, Ser: 0.93 mg/dL (ref 0.44–1.00)
GFR, Estimated: 59 mL/min — ABNORMAL LOW (ref >60)
Glucose, Bld: 113 mg/dL — ABNORMAL HIGH (ref 70–99)
Potassium: 3.6 mmol/L (ref 3.5–5.1)
Sodium: 135 mmol/L (ref 135–145)

## 2023-06-01 MED ORDER — FUROSEMIDE 20 MG PO TABS: 20.0000 mg | ORAL_TABLET | Freq: Every day | ORAL | Status: DC

## 2023-06-01 MED ORDER — FUROSEMIDE 20 MG PO TABS
20.0000 mg | ORAL_TABLET | Freq: Every day | ORAL | Status: DC | PRN
  Administered 2023-06-10: 20 mg via ORAL
  Filled 2023-06-01: qty 1

## 2023-06-01 NOTE — Progress Notes (Signed)
Occupational Therapy Session Note  Patient Details  Name: Lisa [name] MRN: [id number] Date of Birth: 01/05/1934  Today's Date: 06/01/2023 OT Individual Time: [phone] OT Individual Time Calculation (min): 23 min    Short Term Goals: Week 1:  OT Short Term Goal 1 (Week 1): patient will complete sit to stand using a RW for addiitonal balance at [employer] OT Short Term Goal 2 (Week 1): The pt will complete UB dressing for donning and doffing at MinA  at 95% safe OT Short Term Goal 3 (Week 1): The pt will bathe UB/LB at ModI  using A&E as needed at 95% safe after demonstration and initial instruction. OT Short Term Goal 4 (Week 1): The pt will complete LB dressing for donning and doffing at MinA  at 95% safe OT Short Term Goal 5 (Week 1): The pt will complete all stand pivot  transfers from one surface to another at [place]  using the RW and grab bars at 95% safe.  Skilled Therapeutic Interventions/Progress Updates:  Pt received sitting in [facility] for skilled OT session with focus on general conditioning. Pt agreeable to interventions, demonstrating overall pleasant mood. Pt with no reports of pain. OT offering intermediate rest breaks and positioning suggestions throughout session to address pain/fatigue and maximize participation/safety in session.   Pt participates in series of UE strengthening exercises, details below: Bicep curls Modified overhead press (reaching ~90 degrees of shoulder flexion)  Wrist curls Chest press  Pt performs 1-2x10 reps of each exercise with 2# dowel bar, requiring multimodal cuing for correct form. HR stable throughout session, ranging from 76-80 with seated exercises. Pt received/maintained at 2L [place] O2.   Pt remained sitting in [facility] with all immediate needs met at end of session. Pt continues to be appropriate for skilled OT intervention to promote further functional independence.   Therapy Documentation Precautions:  Precautions Precautions: Fall Precaution  Comments: 2L oxygen via [place]; HOH- No hearing aids Restrictions Weight Bearing Restrictions: No   Therapy/Group: Individual Therapy  Lou Cal, OTR/L, MSOT  06/01/2023, 9:56 AM

## 2023-06-01 NOTE — Progress Notes (Addendum)
PROGRESS NOTE   Subjective/Complaints: No new complaints this morning Tolerated therapy well this morning without dizziness, shortness of breath, tachycardia BP was elevated this AM, lasix increased back to 20  ROS: Patient denies fever, chills, rash, chest pain, shortness of breath, nausea, vomiting, diarrhea, abdominal pain, +urinary frequency, denies dizziness  Objective:   No results found. Recent Labs    05/31/23 0735  WBC 5.4  HGB 8.5*  HCT 29.7*  PLT 65*    Recent Labs    05/31/23 0735  NA 138  K 3.0*  CL 94*  CO2 35*  GLUCOSE 105*  BUN 15  CREATININE 1.08*  CALCIUM 9.0    Intake/Output Summary (Last 24 hours) at 06/01/2023 1044 Last data filed at 06/01/2023 0753 Gross per 24 hour  Intake 717 ml  Output --  Net 717 ml        Physical Exam: Vital Signs Blood pressure (!) 163/104, pulse 85, temperature 98.1 F (36.7 C), temperature source Oral, resp. rate 18, height 5\' 3"  (1.6 m), weight 71.6 kg, SpO2 98%.  General:  NAD, BMI 27.96 Gen: no distress, normal appearing HEENT: oral mucosa pink and moist, NCAT Cardio: Reg rate Chest: normal effort, normal rate of breathing Abd: soft, non-distended Ext: no edema Psych: Pleasant, appropriate Skin: Dry without signs of breakdown Neuro: Alert and awake, follows commands,  Normal language and speech. Cranial nerve exam unremarkable. A little HOH. MMT: UE grossly 4 to 4+/5 prox to distal. LE 3+ prox to 4+/5 ADF/PF.  Sensation intact light touch in all 4 extremities. DTR's 1+. No abnl resting tone.   Musculoskeletal: chronic DJD in hands, knees and feet. Minimal joint pain with ROM   Assessment/Plan: 1. Functional deficits which require 3+ hours per day of interdisciplinary therapy in a comprehensive inpatient rehab setting. Physiatrist is providing close team supervision and 24 hour management of active medical problems listed below. Physiatrist and  rehab team continue to assess barriers to discharge/monitor patient progress toward functional and medical goals  Care Tool:  Bathing    Body parts bathed by patient: Right arm, Left arm, Chest, Abdomen, Front perineal area, Buttocks, Face, Right upper leg, Left upper leg   Body parts bathed by helper: Left lower leg, Right lower leg     Bathing assist Assist Level: Minimal Assistance - Patient > 75%     Upper Body Dressing/Undressing Upper body dressing   What is the patient wearing?: Pull over shirt    Upper body assist Assist Level: Minimal Assistance - Patient > 75%    Lower Body Dressing/Undressing Lower body dressing      What is the patient wearing?: Pants, Incontinence brief     Lower body assist Assist for lower body dressing: Moderate Assistance - Patient 50 - 74%     Toileting Toileting    Toileting assist Assist for toileting: Moderate Assistance - Patient 50 - 74%     Transfers Chair/bed transfer  Transfers assist     Chair/bed transfer assist level: Minimal Assistance - Patient > 75%     Locomotion Ambulation   Ambulation assist      Assist level: Minimal Assistance - Patient > 75% Assistive device:  Walker-rolling Max distance: 17'   Walk 10 feet activity   Assist     Assist level: Minimal Assistance - Patient > 75% Assistive device: Walker-rolling   Walk 50 feet activity   Assist Walk 50 feet with 2 turns activity did not occur: Safety/medical concerns (Patient unable to ambulate >17' at this time secondary to global deconditioning and weakness)         Walk 150 feet activity   Assist Walk 150 feet activity did not occur: Safety/medical concerns         Walk 10 feet on uneven surface  activity   Assist     Assist level: Minimal Assistance - Patient > 75% Assistive device: Walker-rolling   Wheelchair     Assist Is the patient using a wheelchair?: Yes Type of Wheelchair: Manual    Wheelchair assist  level: Supervision/Verbal cueing Max wheelchair distance: 138ft    Wheelchair 50 feet with 2 turns activity    Assist        Assist Level: Supervision/Verbal cueing   Wheelchair 150 feet activity     Assist      Assist Level: Supervision/Verbal cueing   Blood pressure (!) 163/104, pulse 85, temperature 98.1 F (36.7 C), temperature source Oral, resp. rate 18, height 5\' 3"  (1.6 m), weight 71.6 kg, SpO2 98%.   Medical Problem List and Plan: 1. Functional deficits secondary to debility secondary to acute on chronic hypoxic and hypercapnic respiratory failure/COPD exacerbation/CAP/acute diastolic heart failure/acute delirium..  Patient uses supplemental oxygen at baseline at home of 2 L.             -patient may shower             -ELOS/Goals: 7 days, mod I to supervision goals with PT and OT  -Continue CIR  IPOC completed  Discussed with therapy that she tolerated therapy very well today without symptoms, can continue therapy and have reached out to cardiology for HR parameters 2.  Antithrombotics: -DVT/anticoagulation:  Mechanical: Antiembolism stockings, thigh (TED hose) Bilateral lower extremities             -antiplatelet therapy: N/A 3. Pain Management: Oxycodone 5 mg every 6 hours as needed moderate pain  -7/28 pain controlled, not using oxycodone frequently 4. Mood/Behavior/Sleep: Remeron 15 mg nightly             -pt reports that she continues to struggle with sleep. We discussed melatonin                         -will begin 3mg  melatonin scheduled at bedtime             -antipsychotic agents: N/A 5. Neuropsych/cognition: This patient is capable of making decisions on her own behalf. 6. Skin/Wound Care: Routine skin checks 7. Fluids/Electrolytes/Nutrition: Routine and analysis with follow-up chemistries 8.  Hyperkalemia.  Resolved with aggressive treatments.  Her Lokelma has since been discontinued.  Patient had resumed her home oral dose of Lasix. -Recheck labs  Monday 9.  Symptomatic chronic hyponatremia.  Nephrology follow-up.  Urine sodium 75 with 275 urine osmolality.  Plasma sodium normalizing -Follow-up chemistries, monitor intake -7/28 Recheck tomorrow 10.  Acute HFpEF exacerbation .  Imdur 30 mg daily,  Lasix 20 mg daily, Toprol XL 25 mg daily.  Monitor for any signs of fluid overload.  Daily weights  -7/28 Will asked nursing to check weights Filed Weights   05/28/23 1511 05/30/23 0718 05/31/23 0702  Weight: 75.4 kg  71.6 kg 71.6 kg    11.  Chronic orthostasis.  Florinef 0.1 mg daily.  Monitor with increased mobility 12.  MDS/CMML C/B neutropenia and thrombocytopenia.  Followed by hematology services Dr.Katragadda. 13.  Recent GI bleed.  Follow-up CBC on Monday - Protonix  daily for prophylaxis. 14.  Recent admission 6/4 - 6/7 for non-STEMI.  Followed by cardiology services Dr.Mallipeddi 15.  PAF.  Not a candidate for anticoagulation due to history of GI bleed.             -HR controlled    06/01/2023    5:17 AM 06/01/2023    2:49 AM 05/31/2023    7:49 PM  Vitals with BMI  Systolic 163 138 956  Diastolic 104 86 66  Pulse 85 85 61    16.  Hypothyroidism.  Synthroid 17.  Hyperlipidemia.  Pravachol 18.  Urinary frequency.   -UA and reflex culture ordered, discussed that UA is negative  19. Hypotension: resolved, increase lasix back to 20mg   20. Hypokalemia: klor ordered, repeat BMP today  21. Afib with RVR: cardiology consulted, continue lopressor, discussed her symptoms with cardiology, discussed that cardiology may increase the metoprolol, discussed that due to the afib she is at risk for hear rate spikes.    I spent >38mins performing patient care related activities, including face to face time, documentation time, med management, discussion of meds and lab orders with patient, and overall coordination of care.     LOS: 4 days A FACE TO FACE EVALUATION WAS PERFORMED  Drema Pry Pau Banh 06/01/2023, 10:44 AM

## 2023-06-01 NOTE — Consult Note (Addendum)
Cardiology Consultation   Patient ID: Lisa [name] MRN: [id number]; DOB: September 08, 1934  Admit date: 05/28/2023 Date of Consult: 06/01/2023  PCP:  [name], MD   [facility] Providers Cardiologist:  Marjo Bicker, MD        Patient Profile:   Lisa [name] is a 89 y.o. female with a hx of  paroxysmal A-fib, moderate to severe aortic valve stenosis and moderate mitral valve regurgitation by echo in 1/24, myelodysplastic syndrome (anemia and thrombocytopenia) COPD on home oxygen 2L, breast cancer s/p lumpectomy and radiation, history of lung cancer s/p LUL lobectomy.  She was seen 04/09/2023 when for NSTEMI, peak troponin 325.  Her EF was normal with grade 2 diastolic dysfunction and a mean aortic valve gradient of 31 mmHg.  She is not a candidate for invasive ischemic evaluation.  She is also not a candidate for antiplatelet or anticoagulation.  Symptomatic management with Imdur and keeping her hemoglobin greater than 8 was recommended.  She was admitted 7/18 - 7/26 with acute on chronic hypoxic and hypercapnic respiratory failure and COPD exacerbation.  She also had hyperkalemia treated with Lokelma and then fludrocortisone, she is to continue the fludrocortisone.  She was discharged to CIR on 7/26.  She was in atrial fibrillation, but the rate was generally controlled.  Who is being seen 06/01/2023 for HR parameters for atrial fib, at the request of Dr Carlis Abbott.  History of Present Illness:   Lisa [name] has been doing well with rehab.   It is anticipated that she will be discharged home with family.  However, on 728, she did not sleep well that night, even with melatonin.  On 729 by reports, when she was working with physical therapy, her heart rate went into the 190s.  It came back down quickly and stabilized.  We were asked to consult to give some guidance on any med changes that might be needed or how to adjust meds when her heart rate goes high.  In  talking with Lisa [name], she remembers having some indigestion type symptoms tonight on the 28th.  The symptoms resolved.  She felt that when she was lying down for bed.  This has happened before.  She has taken GI meds for this and is currently on Protonix 40 mg twice daily.  This is the first episode of this kind of discomfort that she has had in a long time.  She has had no discomfort when working with the rehab team.  She does not remember having palpitations, but remembers being told that her heart rate was very high.  She rested and it went back down.  She does not remember being dizzy or lightheaded during this, but that is what is reported.  She says that she had a good breakfast, and does not feel like she has been dehydrated.  HR this am at rest and when working with rehab was < 80 with O2 sats >/= 97%.  She currently feels well and has no issues, no palpitations, no shortness of breath.   Past Medical History:  Diagnosis Date   Adenocarcinoma of left lung (HCC) 2006   Arthritis    Asthma    Cancer of breast, female (HCC)    Cancer of lung (HCC)    Dysrhythmia    Hypertension    Hypothyroidism    Invasive ductal carcinoma of left breast (HCC) 1999   Myocardial infarction (HCC)    Neutropenia (HCC) 06/09/2016   Personal history of radiation therapy  Past Surgical History:  Procedure Laterality Date   BIOPSY  10/23/2020   Procedure: BIOPSY;  Surgeon: Benancio Deeds, MD;  Location: [facility] ENDOSCOPY;  Service: Gastroenterology;;   BREAST BIOPSY     left axillary node dissection   BREAST LUMPECTOMY Left    COLONOSCOPY  03/08/2003   [id number] rectal polyps destroyed with the tip of the snare/Polyps at hepatic flexure, splenic flexure at 35 cm/Left-sided diverticula: unable to retrieve path   COLONOSCOPY  09/12/2008   [id number] rectum and distal sigmoid diminutive polyps/scattered left sided diverticulum. hyperplastic   COLONOSCOPY N/A 03/06/2013   [id number]  polyp-removed as described above; colonic diverticulosis. hyperplastic polyps. next TCS 03/2018   ESOPHAGOGASTRODUODENOSCOPY (EGD) WITH PROPOFOL N/A 10/23/2020   Procedure: ESOPHAGOGASTRODUODENOSCOPY (EGD) WITH PROPOFOL;  Surgeon: Benancio Deeds, MD;  Location: [facility] ENDOSCOPY;  Service: Gastroenterology;  Laterality: N/A;   FOOT SURGERY     LUNG REMOVAL, PARTIAL     upper lobe     Home Medications:  Prior to Admission medications   Medication Sig Start Date End Date Taking? Authorizing Provider  acetaminophen (TYLENOL) 325 MG tablet Take 2 tablets (650 mg total) by mouth every 6 (six) hours. 11/11/20   Angiulli, Mcarthur Rossetti, PA-C  albuterol (VENTOLIN HFA) 108 (90 Base) MCG/ACT inhaler Inhale 1-2 puffs into the lungs every 4 (four) hours as needed for wheezing or shortness of breath. 05/25/22   [provider]  fludrocortisone (FLORINEF) 0.1 MG tablet Take 1 tablet (0.1 mg total) by mouth daily. 05/29/23 06/28/23  Sherryll Burger, Pratik D, DO  Fluticasone-Umeclidin-Vilant (TRELEGY ELLIPTA) 100-62.5-25 MCG/ACT AEPB Inhale 1 puff into the lungs daily. 12/18/22   Oretha Milch, MD  furosemide (LASIX) 20 MG tablet Take 1 tablet (20 mg total) by mouth daily. 04/09/23   Johnson, Clanford L, MD  gabapentin (NEURONTIN) 100 MG capsule Take 1 capsule (100 mg total) by mouth every 12 (twelve) hours as needed (neuropathy). Patient taking differently: Take 100 mg by mouth 3 (three) times daily as needed (neuropathy). 02/11/22 05/20/23  Vassie Loll, MD  isosorbide mononitrate (IMDUR) 30 MG 24 hr tablet Take 1 tablet (30 mg total) by mouth daily. 04/10/23   Cleora Fleet, MD  levothyroxine (SYNTHROID) 100 MCG tablet Take 1 tablet (100 mcg total) by mouth daily before breakfast. 08/28/21   Johnson, Clanford L, MD  magnesium oxide (MAG-OX) 400 MG tablet Take 400 mg by mouth 2 (two) times daily.    [provider]  metoprolol succinate (TOPROL-XL) 25 MG 24 hr tablet TAKE 1 TABLET DAILY 02/18/23   Mallipeddi,  Vishnu P, MD  mirtazapine (REMERON) 7.5 MG tablet Take 1 tablet (7.5 mg total) by mouth at bedtime. Patient taking differently: Take 15 mg by mouth at bedtime. 05/28/21   Artis Delay, MD  Multiple Vitamin (MULTI-VITAMIN) tablet Take 1 tablet by mouth daily.    [provider]  pantoprazole (PROTONIX) 40 MG tablet Take 1 tablet (40 mg total) by mouth 2 (two) times daily. 04/09/23   Johnson, Clanford L, MD  pravastatin (PRAVACHOL) 10 MG tablet Take 1 tablet (10 mg total) by mouth every evening. 04/09/23   Cleora Fleet, MD    Inpatient Medications: Scheduled Meds:  fludrocortisone  0.1 mg Oral Daily   [START ON 06/02/2023] furosemide  20 mg Oral Daily   isosorbide mononitrate  60 mg Oral Daily   levothyroxine  100 mcg Oral QAC breakfast   melatonin  3 mg Oral QHS   metoprolol succinate  25 mg Oral Daily  mirtazapine  15 mg Oral QHS   pantoprazole  40 mg Oral BID   pravastatin  10 mg Oral QPM   predniSONE  30 mg Oral Q breakfast   Continuous Infusions:  PRN Meds: acetaminophen **OR** acetaminophen, alum & mag hydroxide-simeth, bisacodyl, ipratropium-albuterol, ondansetron **OR** ondansetron (ZOFRAN) IV, oxyCODONE  Allergies:    Allergies  Allergen Reactions   Meloxicam Other (See Comments)    Caused an injury to the kidneys, per nephrologist   Micardis Hct [Telmisartan-Hctz] Other (See Comments)    Caused an injury to the kidneys, per nephrologist    Social History:   Social History   Socioeconomic History   Marital status: Widowed    Spouse name: Not on file   Number of children: Not on file   Years of education: Not on file   Highest education level: Not on file  Occupational History   Not on file  Tobacco Use   Smoking status: Former    Current packs/day: 0.00    Average packs/day: 0.8 packs/day for 35.0 years (26.3 ttl pk-yrs)    Types: Cigarettes    Start date: 18    Quit date: 31    Years since quitting: 31.5   Smokeless tobacco: Never   Tobacco  comments:    smoke-free X 30 yeras  Vaping Use   Vaping status: Never Used  Substance and Sexual Activity   Alcohol use: No   Drug use: No   Sexual activity: Not Currently  Other Topics Concern   Not on file  Social History Narrative   Not on file   Social Determinants of Health   Financial Resource Strain: Not on file  Food Insecurity: No Food Insecurity (05/20/2023)   Hunger Vital Sign    Worried About Running Out of Food in the Last Year: Never true    Ran Out of Food in the Last Year: Never true  Transportation Needs: No Transportation Needs (05/21/2023)   PRAPARE - Administrator, Civil Service (Medical): No    Lack of Transportation (Non-Medical): No  Physical Activity: Inactive (12/12/2020)   Exercise Vital Sign    Days of Exercise per Week: 0 days    Minutes of Exercise per Session: 0 min  Stress: Not on file  Social Connections: Not on file  Intimate Partner Violence: Not At Risk (05/20/2023)   Humiliation, Afraid, Rape, and Kick questionnaire    Fear of Current or Ex-Partner: No    Emotionally Abused: No    Physically Abused: No    Sexually Abused: No    Family History:   Family History  Problem Relation Age of Onset   Cancer Mother    Cancer Sister    Colon cancer Neg Hx      ROS:  Please see the history of present illness.  All other ROS reviewed and negative.     Physical Exam/Data:   Vitals:   05/31/23 1357 05/31/23 1949 06/01/23 0249 06/01/23 0517  BP: (!) 117/59 125/66 138/86 (!) 163/104  Pulse: 62 61 85 85  Resp: 18 16 18 18   Temp: (!) 97.3 F (36.3 C) 99 F (37.2 C) (!) 97.5 F (36.4 C) 98.1 F (36.7 C)  TempSrc:  Oral  Oral  SpO2: 100% 98% 97% 98%  Weight:      Height:        Intake/Output Summary (Last 24 hours) at 06/01/2023 1218 Last data filed at 06/01/2023 0753 Gross per 24 hour  Intake 717 ml  Output --  Net 717 ml      05/31/2023    7:02 AM 05/30/2023    7:18 AM 05/28/2023    3:11 PM  Last 3 Weights  Weight  (lbs) 157 lb 13.6 oz 157 lb 13.6 oz 166 lb 3.6 oz  Weight (kg) 71.6 kg 71.6 kg 75.4 kg     Body mass index is 27.96 kg/m.  General: Elderly, chronically ill-appearing female, in no acute distress HEENT: normal Neck: JVD 9 cm, a little higher on the left Vascular: No carotid bruits; Distal pulses 2+ bilaterally Cardiac:  normal S1, S2; RRR; 2/6 murmur  Lungs: Few scattered rales bases bilaterally, no wheezing, rhonchi   Abd: soft, nontender, no hepatomegaly  Ext: no edema Musculoskeletal:  No deformities, BUE and BLE strength normal and equal Skin: warm and dry  Neuro:  CNs 2-12 intact, no focal abnormalities noted Psych:  Normal affect   EKG:  The EKG was personally reviewed and demonstrates:  07/29 ECG is Afib, HR 84 Telemetry:  Telemetry was personally reviewed and demonstrates:  Not on  Relevant CV Studies:  ECHO: 05/20/2023  1. Limited Echo for LVEF assessment.   2. Left ventricular ejection fraction, by estimation, is 60 to 65%. The left ventricle has normal function. The left ventricle has no regional wall motion abnormalities. There is moderate left ventricular hypertrophy.  Left ventricular diastolic function  could not be evaluated.   3. Right ventricular systolic function is normal. The right ventricular size is not well visualized. There is severely elevated pulmonary artery systolic pressure.   4. The mitral valve is normal in structure. Mild mitral valve regurgitation.   5. The tricuspid valve is abnormal. Tricuspid valve regurgitation is moderate to severe.   6. The aortic valve was not assessed. Aortic valve regurgitation is not visualized. Moderate aortic valve stenosis. Aortic valve Vmax measures 3.00 m/s.   7. The inferior vena cava is dilated in size with <50% respiratory variability, suggesting right atrial pressure of 15 mmHg.   Comparison(s): Changes from prior study are noted. Functional moderate to severe TR is new.   Laboratory Data:  High Sensitivity  Troponin:   Recent Labs  Lab 05/20/23 0245 05/24/23 1917 05/24/23 2144 05/25/23 0441  TROPONINIHS 19* 318* 297* 299*     Chemistry Recent Labs  Lab 05/27/23 0408 05/28/23 0421 05/31/23 0735 06/01/23 1059  NA 136 136 138 135  K 4.6 4.1 3.0* 3.6  CL 97* 96* 94* 93*  CO2 30 30 35* 32  GLUCOSE 80 83 105* 113*  BUN 16 18 15 19   CREATININE 0.94 1.05* 1.08* 0.93  CALCIUM 8.9 9.0 9.0 8.6*  MG 1.6* 1.7  --   --   GFRNONAA 59* 51* 50* 59*  ANIONGAP 9 10 9 10     Recent Labs  Lab 05/31/23 0735  PROT 7.6  ALBUMIN 3.1*  AST 32  ALT 24  ALKPHOS 36*  BILITOT 1.2   Lipids No results for input(s): "CHOL", "TRIG", "HDL", "LABVLDL", "LDLCALC", "CHOLHDL" in the last 168 hours.  Hematology Recent Labs  Lab 05/27/23 0408 05/28/23 0421 05/31/23 0735  WBC 4.6 6.5 5.4  RBC 2.93* 2.67* 2.79*  HGB 8.5* 7.9* 8.5*  HCT 31.3* 28.4* 29.7*  MCV 106.8* 106.4* 106.5*  MCH 29.0 29.6 30.5  MCHC 27.2* 27.8* 28.6*  RDW 23.4* 23.3* 23.8*  PLT 81* 79* 65*   Thyroid No results for input(s): "TSH", "FREET4" in the last 168 hours.  BNPNo results for input(s): "BNP", "PROBNP" in the last 168  hours.  DDimer No results for input(s): "DDIMER" in the last 168 hours.   Radiology/Studies:  No results found.   Assessment and Plan:   Persistent atrial fibrillation, RVR at times -Difficult situation because she needs rate control, but will not tolerate bradycardia -Currently tolerating Toprol-XL 25 mg daily - Resting heart rate was 59 at 1 point, not sure we can increase this -Although heart rate may spike at times, it seems to be returning to normal fairly quickly. - for now, will make no changes in BB  2.  Moderate aortic stenosis, tricuspid regurgitation moderate-severe -She is likely to be preload dependent -If she gets dehydrated, even a small amount, her heart rate may become elevated to compensate -They have been getting bed weights on her, will change this to standing weights to see if  these can be started here but continued at home. -If daily weights can be gotten consistently, the Lasix dose can be adjusted based on these, to keep her volume status stable - recommend make the Lasix 20 mg daily prn based on a weight of >/= 161 lbs.   3.  COPD, deconditioning and other issues -She is tolerating the physical therapy and Occupational Therapy at rehab well, she is expected to be able to return home   Risk Assessment/Risk Scores:       CHA2DS2-VASc Score = 6   This indicates a 9.7% annual risk of stroke. The patient's score is based upon: CHF History: 1 HTN History: 1 Diabetes History: 0 Stroke History: 0 Vascular Disease History: 1 Age Score: 2 Gender Score: 1   For questions or updates, please contact [facility] Please consult [email or web address] for contact info under    Signed, Theodore Demark, PA-C  06/01/2023 12:18 PM  Patient seen and examined with Theodore Demark, PA-C.  Agree as above, with the following exceptions and changes as noted below.  89 year old female with atrial fibrillation and intermittent bradycardia currently admitted to the rehab unit, cardiology consulted for recommendations on rate control.  She is without complaint, sleeping comfortably, notes no chest pain, shortness of breath, palpitations.  Does have mild dyspnea on exertion when working with rehab but recovers quickly.  This is surprising given her severity of aortic valve stenosis in the setting of anemia and unrevascularized coronary artery disease.  Gen: NAD, CV: Irregular rhythm, 3/6 systolic ejection murmur, holosystolic murmur heard better over the left precordium lungs: clear, Abd: soft, Extrem: Warm, well perfused, no edema, Neuro/Psych: alert and oriented x 3, normal mood and affect. All available labs, radiology testing, previous records reviewed.  Patient has multiple medical and cardiac issues that contribute to continued atrial fibrillation.  Hemoglobin is 7-8 on average  this admission, patient's prior admission for NSTEMI where she was felt to not be an intervention candidate due to myelodysplastic syndrome with chronic anemia and thrombocytopenia.  Stroke-volume index is reduced on her echocardiogram from June suggesting that her AS may in fact be low low-flow low gradient severe AS.  She is again unlikely to be an intervention candidate.  She would do better with a more normal hemoglobin, agree with prior recommendations to keep hemoglobin greater than 8 if possible.  Rate control will be challenging with all of these factors at play.  With some intermittent bradycardia, would pursue a less aggressive heart rate control strategy and continue current therapy.  If she is tolerating rehab well with no hemodynamic compromise and blood pressure, this may continue to be the most appropriate therapy for her.  Parke Poisson, MD 06/01/23 6:06 PM

## 2023-06-01 NOTE — Progress Notes (Signed)
Occupational Therapy Session Note  Patient Details  Name: Lisa [name] MRN: [id number] Date of Birth: 1934-10-25  Today's Date: 06/01/2023 OT Individual Time: 1420-1520 OT Individual Time Calculation (min): 60 min  and Today's Date: 06/01/2023 OT Missed Time: 15 Minutes Missed Time Reason: Patient fatigue   Short Term Goals: Week 1:  OT Short Term Goal 1 (Week 1): patient will complete sit to stand using a RW for addiitonal balance at [employer] OT Short Term Goal 2 (Week 1): The pt will complete UB dressing for donning and doffing at [facility]  at 95% safe OT Short Term Goal 3 (Week 1): The pt will bathe UB/LB at [facility]  using A&E as needed at 95% safe after demonstration and initial instruction. OT Short Term Goal 4 (Week 1): The pt will complete LB dressing for donning and doffing at [facility]  at 95% safe OT Short Term Goal 5 (Week 1): The pt will complete all stand pivot  transfers from one surface to another at [facility]  using the RW and grab bars at 95% safe.  Skilled Therapeutic Interventions/Progress Updates:  Skilled OT intervention completed with focus on ambulatory transfers, BUE endurance, and cognitive sequencing/problem solving. Pt received seated in w/c slumped over the Lt side of the w/c asleep. Required increased time to arouse, reporting fatigue but agreeable to session. No pain reported.  Pt politely declined toileting and other self-care needs. NT presetn for vitals (see below). Transported dependently in w/c <> gym for energy conservation.   Seated at table top, pt engaged in the following cognitive and BUE endurance tasks with 2 lb wrist weights applied: -placing pegs onto elevated pegboard with instructions of "create a line with each color." Pt needed min cues for recall, and min A to recognize mistakes -clip tower placement <> retrieval using BUE; fatigue expressed from activity with noted Rt shoulder ROM deficits compared to LUE  During above activities, pt was nodding off, becoming  easily distracted with a blank stare outside window and when asked if she felt okay, pt just verbalized fatigue stating she hasn't slept at night but this is a baseline issue.   Back in room, OT offered toileting again however pt politely declined requesting to return to bed. Light min A sit > stand using RW, then min A ambulatory transfer to EOB about 10 ft with directional cues and stepping pattern with backing up to EOB. Untied/doffed shoes with supervision, then with cues for bed mobility to lower onto Rt elbow then lift legs, pt was able to transition EOB > supine with supervision with increased time. Pt missed 15 mins of OT intervention due to fatigue; will make up missed time as able. Pt remained upright in bed, with bed alarm on/activated, and with all needs in reach at end of session.  Vitals BP 98/64 (seated), HR 75 bpm, SPO2 100% on 2L; asymptomatic SPO2 94% on 2L at end of session   Therapy Documentation Precautions:  Precautions Precautions: Fall Precaution Comments: 2L oxygen via [place]; HOH- No hearing aids Restrictions Weight Bearing Restrictions: No    Therapy/Group: Individual Therapy  Melvyn Novas, MS, OTR/L  06/01/2023, 3:43 PM

## 2023-06-01 NOTE — Progress Notes (Signed)
Physical Therapy Session Note  Patient Details  Name: Lisa [name] MRN: [id number] Date of Birth: 12-27-1933  Today's Date: 06/01/2023 PT Individual Time: 0730-0842 PT Individual Time Calculation (min): 72 min   Short Term Goals: Week 1:  PT Short Term Goal 1 (Week 1): Patient will perform bed mobility with MinA PT Short Term Goal 2 (Week 1): Patient will perform sit/stand with LRAD and MinA consistently PT Short Term Goal 3 (Week 1): Patient will ambulate >50' with LRAD and MinA  Skilled Therapeutic Interventions/Progress Updates:   Received pt semi-reclined in bed, pt agreeable to PT treatment, and denied any pain during session. Pt on 2L O2 with SPO2 >97% throughout session. Session with emphasis on functional mobility/transfers, toileting, generalized strengthening and endurance, and gait training. Pt transferred semi-reclined<>sitting R EOB with HOB elevated and use of bedrails with supervision. Donned shoes with max A and pt reported urge to void - stood from elevated EOB with RW and min A (cues for hand placement) and ambulated in/out of bathroom with RW and CGA/min A over threshold. Pt continent of bladder and performed hygiene management seated without assist. Stood from toilet with bedside commode over top with RW and light min A and dependent to don clean brief and for clothing management.   RN arrived to administer medications and pt sat in [facility] at sink and performed hand hygiene and brushed teeth with set up assist. Pt then performed WC mobility 173ft using BUE with supervision and increased time to main therapy gym. Pt set up to practice stair navigation, then notified by primary OT of MD's request for EOB or bedlevel therapy until results from EKG return. Pt transported back to room in [facility] dependently and performed the following exercises seated in WC with emphasis on LE strength/ROM: -hip flexion with red TB 2x15 -LAQ 2x15 bilaterally  -hip abduction with red TB 2x15 -hip adduction  pillow squeezes 2x10 with 5 second isometric hold Concluded session with pt sitting in WC, needs within reach, and seatbelt alarm on. At end of session MD arrived for morning rounds and did clear pt for regular therapy as tolerated with HR <140bpm.  Vitals: Supine at rest: HR 70bpm and SPO2 98% Seated in WC: HR 77bpm SPO2 97% Seated in WC during exercises: HR 71bpm and SPO2 97%  Therapy Documentation Precautions:  Precautions Precautions: Fall Precaution Comments: 2L oxygen via [place]; HOH- No hearing aids Restrictions Weight Bearing Restrictions: No  Therapy/Group: Individual Therapy [name] PT, DPT 06/01/2023, 6:50 AM

## 2023-06-01 NOTE — Plan of Care (Signed)
  Problem: Sit to Stand Goal: LTG:  Patient will perform sit to stand with assistance level (PT) Description: LTG:  Patient will perform sit to stand with assistance level (PT) Flowsheets (Taken 06/01/2023 0657) LTG: PT will perform sit to stand in preparation for functional mobility with assistance level: (downgraded due to weakness) Supervision/Verbal cueing Note: downgraded due to weakness

## 2023-06-01 NOTE — Progress Notes (Signed)
Occupational Therapy Session Note  Patient Details  Name: Ileah Wrzesinski MRN: [id number] Date of Birth: 04-[name]-1934  Today's Date: 06/01/2023 OT Individual Time: 1303-1330 OT Individual Time Calculation (min): 27 min    Short Term Goals: Week 1:  OT Short Term Goal 1 (Week 1): patient will complete sit to stand using a RW for addiitonal balance at [employer] OT Short Term Goal 2 (Week 1): The pt will complete UB dressing for donning and doffing at [facility]  at 95% safe OT Short Term Goal 3 (Week 1): The pt will bathe UB/LB at [facility]  using A&E as needed at 95% safe after demonstration and initial instruction. OT Short Term Goal 4 (Week 1): The pt will complete LB dressing for donning and doffing at [facility]  at 95% safe OT Short Term Goal 5 (Week 1): The pt will complete all stand pivot  transfers from one surface to another at [facility]  using the RW and grab bars at 95% safe.  Skilled Therapeutic Interventions/Progress Updates:      Therapy Documentation Precautions:  Precautions Precautions: Fall Precaution Comments: 2L oxygen via [place]; HOH- No hearing aids Restrictions Weight Bearing Restrictions: No General: "I used to live in [place]." Pt seated in W/C upon OT arrival, agreeable to OT.   Pain:   no pain reported   Exercises: Pt issued UE theraband in order to increase functional strength, andurance and activity tolerance in order to increase independence in ADLs such as bathing. Pt issued red theraband and discussed direction/technique of exercises, demonstrating verbal understanding. Pt completed 3x10 exercises at W/C level listed below: -elbow extensions -bicep curls  -external rotation   Other Treatments:   Pt standing and stepping up onto weight scale with nurse and OT at hand held assist of 2 without AE and no LOB, static standing CGA   Pt seated in W/C at end of session with W/C alarm donned, call light within reach and 4Ps assessed.    Therapy/Group: Individual Therapy  Velia Meyer, OTD, OTR/L 06/01/2023, 2:44 PM

## 2023-06-02 ENCOUNTER — Inpatient Hospital Stay (HOSPITAL_COMMUNITY): Payer: Medicare Other

## 2023-06-02 MED ORDER — POTASSIUM CHLORIDE 20 MEQ PO PACK
40.0000 meq | PACK | Freq: Once | ORAL | Status: AC
  Administered 2023-06-02: 40 meq via ORAL
  Filled 2023-06-02: qty 2

## 2023-06-02 MED ORDER — ALBUTEROL SULFATE (2.5 MG/3ML) 0.083% IN NEBU
2.5000 mg | INHALATION_SOLUTION | Freq: Four times a day (QID) | RESPIRATORY_TRACT | Status: DC | PRN
  Administered 2023-06-08: 2.5 mg via RESPIRATORY_TRACT
  Filled 2023-06-02: qty 3

## 2023-06-02 NOTE — Plan of Care (Signed)

## 2023-06-02 NOTE — Progress Notes (Signed)
Occupational Therapy Session Note  Patient Details  Name: Lisa [name] MRN: [id number] Date of Birth: 15-Oct-1934  Today's Date: 06/02/2023 OT Individual Time: 1914-7829 & 5621-3086 OT Individual Time Calculation (min): 70 min & 52 min  OT missed time: 23 min Missed time reason: x-ray; fatigue  Short Term Goals: Week 1:  OT Short Term Goal 1 (Week 1): patient will complete sit to stand using a RW for addiitonal balance at [employer] OT Short Term Goal 2 (Week 1): The pt will complete UB dressing for donning and doffing at [facility]  at 95% safe OT Short Term Goal 3 (Week 1): The pt will bathe UB/LB at [facility]  using A&E as needed at 95% safe after demonstration and initial instruction. OT Short Term Goal 4 (Week 1): The pt will complete LB dressing for donning and doffing at [facility]  at 95% safe OT Short Term Goal 5 (Week 1): The pt will complete all stand pivot  transfers from one surface to another at [facility]  using the RW and grab bars at 95% safe.  Skilled Therapeutic Interventions/Progress Updates:  Session 1 Skilled OT intervention completed with focus on ADL retraining and ambulatory transfers. Pt received seated EOB with nursing providing meds, agreeable to session. No pain reported.  Pt politely declined shower this AM with preference to sponge bathe. From elevated bed, pt stood using RW with light min A then ambulated with CGA using RW to w/c. Pt voiced urge to void, therefore transported w/c dependently in w/c > elevated BSC over toilet, then stood with CGA using grab bars and CGA stand pivot to [facility]. Continent of urinary void. Threaded personal brief with total A due to height of commode. Cues needed for sequencing but able to complete all toileting steps with min A for LB clothing management, with up to min A for standing balance without UE support during peri-washing. CGA stand pivot to w/c with grab bar.  Seated at sink, pt completed oral care with set up A. Note- pt coughing up dark green phlegm  into sink and audible raspy breathing with exertion. MD notified, and later present for rounds. Pt needed step by step cues 2/2 initiation and sequencing deficits for bathing/dressing. Bathed with overall supervision, for seated bathing only, donned deo/shirt with min A for O2 cord management only, donned pants with light min A needed only  to stand using sink. Max A needed to [employer], with pt stating "I haven't tied my shoes since I've been sick... which has been like 3 years, my daughter normally does for me." Discussed pre-tied shoes or slip on shoes for independence if challenged by recall of steps for this.   OT suggested that pt remain in recliner for proper neck positioning as pt often falls asleep and slumps over in between sessions and pt agreeable. Min A sit > stand using RW and CGA ambulatory transfer about 15 ft to recliner with directional cues.   Pt remained seated in recliner, with chair pad alarm on/activated, and with all needs in reach at end of session.  Vitals -Received with [place] on but not in nose, therefore on RA and unsure for how long, SPO2 93% -Per orders, placed pt on 2L, with 97% sat however plan to verify with MD about weaning orders for O2 given pt has chronic history of supplemental O2 use   Session 2 OT arrived for therapy session however pt out of room. Nursing indicated pt was transported for chest x-ray. Ended session early 2/2  fatigue, therefore pt missed overall 23 mins of OT intervention as a result. Will attempt to make up missed mins as able.  On 2nd visit, pt received seated in w/c requesting to return to bed 2/2 fatigue, however with light encouragement for therapy, pt agreeable. No pain reported. Transported dependently in w/c <> gym.  Tried sit > stand using elevated table for balance however pt with inefficient anterior weight shifting, requiring use of RW per request to power up with mod cues for hand placement. Able to maintain stance with CGA  using RW during several rounds of connect 4 game. No overt LOB however with BLE shakiness, expressed fatigue in BLE, requiring short durations of standing with greatest length about 3 mins and prolonged seated rest breaks. Min fading to supervision for sequence of game but total A needed for recognizing 4 in a row each round demonstrating difficulty with alternating attention during task.  Transitioned seated for cognitive problem solving task via sliding pegboard design. For about 80% of the task, pt required max to total A cues for sequencing and problem solving, stating "this is so tedious." Noted to have increased difficulty with loud environment. Other 20% of task, pt was able to complete 1 column with supervision.   Pt with visible fatigue, low concentration, and frequent nodding off, therefore transitioned back to room to assist pt back to bed. With w/c outside door, pt stood with light min A using RW, ambulated about 15 ft to EOB with mod cues for directional positioning especially backing up to EOB prior to sitting. Doffed shoe with supervision, then transitioned > supine without assist. Pt remained upright in bed, with bed alarm on/activated, water provided per request and with all needs in reach at end of session.  Vitals -Received on 2L via [place], SPO2 96% -96% after activity and remained on 2L at end of session   Therapy Documentation Precautions:  Precautions Precautions: Fall Precaution Comments: 2L oxygen via [place]; HOH- No hearing aids Restrictions Weight Bearing Restrictions: No    Therapy/Group: Individual Therapy  Melvyn Novas, MS, OTR/L  06/02/2023, 3:40 PM

## 2023-06-02 NOTE — Progress Notes (Signed)
Patient ID: Lisa [name], female   DOB: 1934-04-30, 89 y.o.   MRN: [id number]  12: 30 PM: Sw spoke with grandson, Frederik Schmidt and made attempt to provide team conference updates. Grandson requesting a call after 3PM.   SW made attempt to call daughter, Steward Drone to discuss conference updates and arrange family education. SW left detailed VM.  3:03 PM: SW made attempt to call patient grandson, Frederik Schmidt. SW informed by physician that grandson prefers to text. SW informed grandson via text of pt's d/c date. Contact information provided to addressed any additional questions or concerns.

## 2023-06-02 NOTE — Plan of Care (Signed)
  Problem: RH Balance Goal: LTG Patient will maintain dynamic standing balance (PT) Description: LTG:  Patient will maintain dynamic standing balance with assistance during mobility activities (PT) Flowsheets (Taken 06/02/2023 1223) LTG: Pt will maintain dynamic standing balance during mobility activities with:: (downgraded due to weakness/deconditioning and decreased recall of education/cues) Supervision/Verbal cueing Note: downgraded due to weakness/deconditioning and decreased recall of education/cues    Problem: RH Bed to Chair Transfers Goal: LTG Patient will perform bed/chair transfers w/assist (PT) Description: LTG: Patient will perform bed to chair transfers with assistance (PT). Flowsheets (Taken 06/02/2023 1223) LTG: Pt will perform Bed to Chair Transfers with assistance level: (downgraded due to weakness/deconditioning and decreased recall of education/cues) Supervision/Verbal cueing Note: downgraded due to weakness/deconditioning and decreased recall of education/cues    Problem: RH Ambulation Goal: LTG Patient will ambulate in home environment (PT) Description: LTG: Patient will ambulate in home environment, # of feet with assistance (PT). Flowsheets (Taken 06/02/2023 1223) LTG: Pt will ambulate in home environ  assist needed:: (downgraded due to weakness/deconditioning and decreased recall of education/cues) Supervision/Verbal cueing LTG: Ambulation distance in home environment: 44ft Note: downgraded due to weakness/deconditioning and decreased recall of education/cues  Goal: LTG Patient will ambulate in community environment (PT) Description: LTG: Patient will ambulate in community environment, # of feet with assistance (PT). Flowsheets (Taken 06/02/2023 1223) LTG: Pt will ambulate in community environ  assist needed:: (downgraded due to weakness/deconditioning and decreased recall of education/cues) Supervision/Verbal cueing LTG: Ambulation distance in community environment: 50    Problem: RH Stairs Goal: LTG Patient will ambulate up and down stairs w/assist (PT) Description: LTG: Patient will ambulate up and down # of stairs with assistance (PT) Flowsheets (Taken 06/02/2023 1223) LTG: Pt will ambulate up/down stairs assist needed:: (downgraded due to weakness/deconditioning and decreased recall of education/cues) Contact Guard/Touching assist LTG: Pt will  ambulate up and down number of stairs: 4 steps with 2 handrails Note: downgraded due to weakness/deconditioning and decreased recall of education/cues

## 2023-06-02 NOTE — Progress Notes (Signed)
PROGRESS NOTE   Subjective/Complaints: Producing green phlegm, CXR ordered, denies shortness of breath, lower extremity edema, discussed cardiology recommendations   ROS: Patient denies fever, chills, rash, chest pain, shortness of breath, nausea, vomiting, diarrhea, abdominal pain, +urinary frequency, denies dizziness  Objective:   No results found. Recent Labs    05/31/23 0735  WBC 5.4  HGB 8.5*  HCT 29.7*  PLT 65*    Recent Labs    05/31/23 0735 06/01/23 1059  NA 138 135  K 3.0* 3.6  CL 94* 93*  CO2 35* 32  GLUCOSE 105* 113*  BUN 15 19  CREATININE 1.08* 0.93  CALCIUM 9.0 8.6*    Intake/Output Summary (Last 24 hours) at 06/02/2023 1451 Last data filed at 06/02/2023 1414 Gross per 24 hour  Intake 474 ml  Output 1375 ml  Net -901 ml        Physical Exam: Vital Signs Blood pressure 114/73, pulse 85, temperature 97.6 F (36.4 C), resp. rate 18, height 5\' 3"  (1.6 m), weight 71.4 kg, SpO2 100%.  General:  NAD, BMI 27.96 Gen: no distress, normal appearing HEENT: oral mucosa pink and moist, NCAT Cardio: Reg rate Chest: normal effort, normal rate of breathing Abd: soft, non-distended Ext: no edema Psych: pleasant, normal affect Skin: Dry without signs of breakdown Neuro: Alert and awake, follows commands,  Normal language and speech. Cranial nerve exam unremarkable. A little HOH. MMT: UE grossly 4 to 4+/5 prox to distal. LE 3+ prox to 4+/5 ADF/PF.  Sensation intact light touch in all 4 extremities. DTR's 1+. No abnl resting tone.   Musculoskeletal: chronic DJD in hands, knees and feet. Minimal joint pain with ROM   Assessment/Plan: 1. Functional deficits which require 3+ hours per day of interdisciplinary therapy in a comprehensive inpatient rehab setting. Physiatrist is providing close team supervision and 24 hour management of active medical problems listed below. Physiatrist and rehab team continue to  assess barriers to discharge/monitor patient progress toward functional and medical goals  Care Tool:  Bathing    Body parts bathed by patient: Right arm, Left arm, Chest, Abdomen, Front perineal area, Buttocks, Face, Right upper leg, Left upper leg, Right lower leg, Left lower leg   Body parts bathed by helper: Left lower leg, Right lower leg     Bathing assist Assist Level: Minimal Assistance - Patient > 75%     Upper Body Dressing/Undressing Upper body dressing   What is the patient wearing?: Pull over shirt    Upper body assist Assist Level: Minimal Assistance - Patient > 75%    Lower Body Dressing/Undressing Lower body dressing      What is the patient wearing?: Pants, Incontinence brief     Lower body assist Assist for lower body dressing: Minimal Assistance - Patient > 75%     Toileting Toileting    Toileting assist Assist for toileting: Minimal Assistance - Patient > 75%     Transfers Chair/bed transfer  Transfers assist     Chair/bed transfer assist level: Minimal Assistance - Patient > 75%     Locomotion Ambulation   Ambulation assist      Assist level: Minimal Assistance - Patient > 75%  Assistive device: Walker-rolling Max distance: 78ft   Walk 10 feet activity   Assist     Assist level: Minimal Assistance - Patient > 75% Assistive device: Walker-rolling   Walk 50 feet activity   Assist Walk 50 feet with 2 turns activity did not occur: Safety/medical concerns (Patient unable to ambulate >17' at this time secondary to global deconditioning and weakness)  Assist level: Minimal Assistance - Patient > 75% Assistive device: Walker-rolling    Walk 150 feet activity   Assist Walk 150 feet activity did not occur: Safety/medical concerns         Walk 10 feet on uneven surface  activity   Assist     Assist level: Minimal Assistance - Patient > 75% Assistive device: Walker-rolling   Wheelchair     Assist Is the patient  using a wheelchair?: Yes Type of Wheelchair: Manual    Wheelchair assist level: Supervision/Verbal cueing Max wheelchair distance: 126ft    Wheelchair 50 feet with 2 turns activity    Assist        Assist Level: Supervision/Verbal cueing   Wheelchair 150 feet activity     Assist      Assist Level: Supervision/Verbal cueing   Blood pressure 114/73, pulse 85, temperature 97.6 F (36.4 C), resp. rate 18, height 5\' 3"  (1.6 m), weight 71.4 kg, SpO2 100%.   Medical Problem List and Plan: 1. Functional deficits secondary to debility secondary to acute on chronic hypoxic and hypercapnic respiratory failure/COPD exacerbation/CAP/acute diastolic heart failure/acute delirium..  Patient uses supplemental oxygen at baseline at home of 2 L.             -patient may shower             -ELOS/Goals: 7 days, mod I to supervision goals with PT and OT  -Continue CIR  IPOC completed  Discussed with therapy that she tolerated therapy very well today without symptoms, can continue therapy and have reached out to cardiology for HR parameters  Updated grandson 2.  Antithrombotics: -DVT/anticoagulation:  Mechanical: Antiembolism stockings, thigh (TED hose) Bilateral lower extremities             -antiplatelet therapy: N/A 3. Pain Management: Oxycodone 5 mg every 6 hours as needed moderate pain  -7/28 pain controlled, not using oxycodone frequently 4. Mood/Behavior/Sleep: Remeron 15 mg nightly             -pt reports that she continues to struggle with sleep. We discussed melatonin                         -will begin 3mg  melatonin scheduled at bedtime             -antipsychotic agents: N/A 5. Neuropsych/cognition: This patient is capable of making decisions on her own behalf. 6. Skin/Wound Care: Routine skin checks 7. Fluids/Electrolytes/Nutrition: Routine and analysis with follow-up chemistries 8.  Hyperkalemia.  Resolved with aggressive treatments.  Her Lokelma has since been  discontinued.  Patient had resumed her home oral dose of Lasix. -Recheck labs Monday 9.  Symptomatic chronic hyponatremia.  Nephrology follow-up.  Urine sodium 75 with 275 urine osmolality.  Plasma sodium normalizing -Follow-up chemistries, monitor intake -7/28 Recheck tomorrow 10.  Acute HFpEF exacerbation .  Imdur 30 mg daily,  Lasix 20 mg daily, Toprol XL 25 mg daily.  Monitor for any signs of fluid overload.  Daily weights  -7/28 Will asked nursing to check weights Filed Weights   05/31/23 [phone]  06/01/23 0751 06/02/23 0432  Weight: 71.6 kg 72.4 kg 71.4 kg    11.  Chronic orthostasis.  Florinef 0.1 mg daily.  Monitor with increased mobility 12.  MDS/CMML C/B neutropenia and thrombocytopenia.  Followed by hematology services Dr.Katragadda. 13.  Recent GI bleed.  Follow-up CBC on Monday - Protonix  daily for prophylaxis. 14.  Recent admission 6/4 - 6/7 for non-STEMI.  Followed by cardiology services Dr.Mallipeddi 15.  PAF.  Not a candidate for anticoagulation due to history of GI bleed.             -HR controlled    06/02/2023    8:03 AM 06/02/2023    4:47 AM 06/02/2023    4:32 AM  Vitals with BMI  Weight   157 lbs 7 oz  BMI   27.89  Systolic 114 149   Diastolic 73 64   Pulse 85 44     16.  Hypothyroidism.  Synthroid 17.  Hyperlipidemia.  Pravachol  18.  Urinary frequency.   -UA and reflex culture ordered, discussed that UA is negative  19. Hypotension: resolved, increase lasix back to 20mg , discussed with grand son dosing lasix based on standing weights.   20. Hypokalemia: klor ordered x2 days this admission  21. Afib with RVR: cardiology consulted, continue lopressor, discussed her symptoms with cardiology, discussed that cardiology decided to maintain current metoprolol dose given her bradycardia, discussed that due to the afib she is at risk for hear rate spikes.   I spent >27mins performing patient care related activities, including face to face time, documentation  time, med management, discussion of meds and lab orders with patient, and overall coordination of care.    LOS: 5 days A FACE TO FACE EVALUATION WAS PERFORMED  Drema Pry Olivette Beckmann 06/02/2023, 2:51 PM

## 2023-06-02 NOTE — Plan of Care (Deleted)
Goals downgraded to Supervision to reflect needed assist for cueing for initiation, sequencing and problem solving  Problem: RH Simple Meal Prep Goal: LTG Patient will perform simple meal prep w/assist (OT) Description: LTG: Patient will perform simple meal prep with assistance, with/without cues (OT). Outcome: Not Applicable Flowsheets (Taken 06/02/2023 1215) LTG: Pt will perform simple meal prep with assistance level of: (Discontinued 2/2 family completing IADLs for pt at baseline and is not appropriate) --   Problem: Sit to Stand Goal: LTG:  Patient will perform sit to stand in prep for activites of daily living with assistance level (OT) Description: LTG:  Patient will perform sit to stand in prep for activites of daily living with assistance level (OT) 06/02/2023 1215 by Candee Furbish E, OT Flowsheets (Taken 06/02/2023 1215) LTG: PT will perform sit to stand in prep for activites of daily living with assistance level: (downgraded to reflect needed assist for cueing for initiation, sequencing and problem solving) Supervision/Verbal cueing 06/02/2023 1215 by Candee Furbish E, OT Reactivated   Problem: RH Bathing Goal: LTG Patient will bathe all body parts with assist levels (OT) Description: LTG: Patient will bathe all body parts with assist levels (OT) 06/02/2023 1215 by Candee Furbish E, OT Flowsheets (Taken 06/02/2023 1215) LTG: Pt will perform bathing with assistance level/cueing: (downgraded to reflect needed assist for cueing for initiation, sequencing and problem solving) Supervision/Verbal cueing 06/02/2023 1215 by Candee Furbish E, OT Reactivated   Problem: RH Dressing Goal: LTG Patient will perform upper body dressing (OT) Description: LTG Patient will perform upper body dressing with assist, with/without cues (OT). 06/02/2023 1215 by Candee Furbish E, OT Flowsheets (Taken 06/02/2023 1215) LTG: Pt will perform upper body dressing with assistance level of: (downgraded to reflect needed  assist for cueing for initiation, sequencing and problem solving) Set up assist 06/02/2023 1215 by Cheyenne Adas, Lashona Schaaf E, OT Reactivated Goal: LTG Patient will perform lower body dressing w/assist (OT) Description: LTG: Patient will perform lower body dressing with assist, with/without cues in positioning using equipment (OT) 06/02/2023 1215 by Candee Furbish E, OT Flowsheets (Taken 06/02/2023 1215) LTG: Pt will perform lower body dressing with assistance level of: (downgraded to reflect needed assist for cueing for initiation, sequencing and problem solving) Supervision/Verbal cueing 06/02/2023 1215 by Candee Furbish E, OT Reactivated   Problem: RH Toileting Goal: LTG Patient will perform toileting task (3/3 steps) with assistance level (OT) Description: LTG: Patient will perform toileting task (3/3 steps) with assistance level (OT)  06/02/2023 1215 by Candee Furbish E, OT Flowsheets (Taken 06/02/2023 1215) LTG: Pt will perform toileting task (3/3 steps) with assistance level: (downgraded to reflect needed assist for cueing for initiation, sequencing and problem solving) Supervision/Verbal cueing 06/02/2023 1215 by Candee Furbish E, OT Reactivated   Problem: RH Tub/Shower Transfers Goal: LTG Patient will perform tub/shower transfers w/assist (OT) Description: LTG: Patient will perform tub/shower transfers with assist, with/without cues using equipment (OT) 06/02/2023 1215 by Candee Furbish E, OT Flowsheets (Taken 06/02/2023 1215) LTG: Pt will perform tub/shower stall transfers with assistance level of: (downgraded to reflect needed assist for cueing for initiation, sequencing and problem solving) Contact Guard/Touching assist 06/02/2023 1215 by Candee Furbish E, OT Reactivated   Problem: Sit to Stand Goal: LTG:  Patient will perform sit to stand in prep for activites of daily living with assistance level (OT) Description: LTG:  Patient will perform sit to stand in prep for activites of daily living with  assistance level (OT) Flowsheets (Taken 06/02/2023 1215) LTG: PT will perform sit to  stand in prep for activites of daily living with assistance level: (downgraded to reflect needed assist for cueing for initiation, sequencing and problem solving) Supervision/Verbal cueing

## 2023-06-02 NOTE — Progress Notes (Signed)
Physical Therapy Session Note  Patient Details  Name: Lisa [name] MRN: [id number] Date of Birth: 1933/11/04  Today's Date: 06/02/2023 PT Individual Time: 1030-1125 PT Individual Time Calculation (min): 55 min   Short Term Goals: Week 1:  PT Short Term Goal 1 (Week 1): Patient will perform bed mobility with MinA PT Short Term Goal 2 (Week 1): Patient will perform sit/stand with LRAD and MinA consistently PT Short Term Goal 3 (Week 1): Patient will ambulate >50' with LRAD and MinA  Skilled Therapeutic Interventions/Progress Updates:   Received pt sitting in recliner with call bell going off - pt reporting urge to void. Pt agreeable to PT treatment and denied any pain during session. Pt on 2L O2 with SPO2 >92% throughout session and HR <98bpm. Session with emphasis on functional mobility/transfers, toileting, generalized strengthening and endurance, dynamic standing balance/coordination, gait training, and stair navigation.  Pt required x 3 attempts and max A to stand from recliner with RW with cues for anterior weight shifting and hand placement on RW. Pt ambulated in/out of bathroom with RW and min A. Pt required mod A for clothing management, continent of bladder, and performed hygiene management seated without assist. Stood from toilet with bedside commode over top with RW and min A and sat in WC at sink and performed hand hygiene with supervision. Pt transported to/from room in [facility] dependently for time management purposes.   Stood from [facility] at staircase with mod A x 2 trials (cues for hand placement and anterior weight shifting) and pt navigated 4 6in steps with bilateral handrails and min A x 2 trials ascending and descending with a step to pattern - pt required extensive seated rest break in between trials. Stood from [facility] with RW and mod A and ambulated 41ft with RW and min A - cues for upright posture/gaze. Pt transferred mat<>WC stand<>pivot with RW and min A (mod A to stand) and returned to  room. Concluded session with pt sitting in WC, needs within reach, and seatbelt alarm on.  Therapy Documentation Precautions:  Precautions Precautions: Fall Precaution Comments: 2L oxygen via [place]; HOH- No hearing aids Restrictions Weight Bearing Restrictions: No  Therapy/Group: Individual Therapy [name] PT, DPT 06/02/2023, 7:13 AM

## 2023-06-02 NOTE — Plan of Care (Signed)
Downgraded to reflect needed assist for cueing for initiation, sequencing and problem solving   Problem: RH Simple Meal Prep Goal: LTG Patient will perform simple meal prep w/assist (OT) Description: LTG: Patient will perform simple meal prep with assistance, with/without cues (OT). Outcome: Not Applicable Flowsheets (Taken 06/02/2023 1215) LTG: Pt will perform simple meal prep with assistance level of: (Discontinued 2/2 family completing IADLs for pt at baseline and is not appropriate) --   Problem: Sit to Stand Goal: LTG:  Patient will perform sit to stand in prep for activites of daily living with assistance level (OT) Description: LTG:  Patient will perform sit to stand in prep for activites of daily living with assistance level (OT) 06/02/2023 1215 by Candee Furbish E, OT Flowsheets (Taken 06/02/2023 1215) LTG: PT will perform sit to stand in prep for activites of daily living with assistance level: (downgraded to reflect needed assist for cueing for initiation, sequencing and problem solving) Supervision/Verbal cueing 06/02/2023 1215 by Candee Furbish E, OT Reactivated   Problem: RH Bathing Goal: LTG Patient will bathe all body parts with assist levels (OT) Description: LTG: Patient will bathe all body parts with assist levels (OT) 06/02/2023 1215 by Candee Furbish E, OT Flowsheets (Taken 06/02/2023 1215) LTG: Pt will perform bathing with assistance level/cueing: (downgraded to reflect needed assist for cueing for initiation, sequencing and problem solving) Supervision/Verbal cueing 06/02/2023 1215 by Candee Furbish E, OT Reactivated   Problem: RH Dressing Goal: LTG Patient will perform upper body dressing (OT) Description: LTG Patient will perform upper body dressing with assist, with/without cues (OT). 06/02/2023 1215 by Candee Furbish E, OT Flowsheets (Taken 06/02/2023 1215) LTG: Pt will perform upper body dressing with assistance level of: (downgraded to reflect needed assist for cueing for  initiation, sequencing and problem solving) Set up assist 06/02/2023 1215 by Cheyenne Adas, Sanjiv Castorena E, OT Reactivated Goal: LTG Patient will perform lower body dressing w/assist (OT) Description: LTG: Patient will perform lower body dressing with assist, with/without cues in positioning using equipment (OT) 06/02/2023 1215 by Candee Furbish E, OT Flowsheets (Taken 06/02/2023 1215) LTG: Pt will perform lower body dressing with assistance level of: (downgraded to reflect needed assist for cueing for initiation, sequencing and problem solving) Supervision/Verbal cueing 06/02/2023 1215 by Candee Furbish E, OT Reactivated   Problem: RH Toileting Goal: LTG Patient will perform toileting task (3/3 steps) with assistance level (OT) Description: LTG: Patient will perform toileting task (3/3 steps) with assistance level (OT)  06/02/2023 1215 by Candee Furbish E, OT Flowsheets (Taken 06/02/2023 1215) LTG: Pt will perform toileting task (3/3 steps) with assistance level: (downgraded to reflect needed assist for cueing for initiation, sequencing and problem solving) Supervision/Verbal cueing 06/02/2023 1215 by Candee Furbish E, OT Reactivated   Problem: RH Tub/Shower Transfers Goal: LTG Patient will perform tub/shower transfers w/assist (OT) Description: LTG: Patient will perform tub/shower transfers with assist, with/without cues using equipment (OT) 06/02/2023 1215 by Candee Furbish E, OT Flowsheets (Taken 06/02/2023 1215) LTG: Pt will perform tub/shower stall transfers with assistance level of: (downgraded to reflect needed assist for cueing for initiation, sequencing and problem solving) Contact Guard/Touching assist 06/02/2023 1215 by Candee Furbish E, OT Reactivated   Problem: Sit to Stand Goal: LTG:  Patient will perform sit to stand in prep for activites of daily living with assistance level (OT) Description: LTG:  Patient will perform sit to stand in prep for activites of daily living with assistance level  (OT) Flowsheets (Taken 06/02/2023 1215) LTG: PT will perform sit to stand in  prep for activites of daily living with assistance level: (downgraded to reflect needed assist for cueing for initiation, sequencing and problem solving) Supervision/Verbal cueing   Problem: RH Toilet Transfers Goal: LTG Patient will perform toilet transfers w/assist (OT) Description: LTG: Patient will perform toilet transfers with assist, with/without cues using equipment (OT) Flowsheets (Taken 06/02/2023 1224) LTG: Pt will perform toilet transfers with assistance level of: (downgraded to reflect needed assist for cueing for initiation, sequencing and problem solving) Supervision/Verbal cueing

## 2023-06-03 LAB — BASIC METABOLIC PANEL
Anion gap: 10 (ref 5–15)
BUN: 22 mg/dL (ref 8–23)
CO2: 30 mmol/L (ref 22–32)
Calcium: 8.5 mg/dL — ABNORMAL LOW (ref 8.9–10.3)
Chloride: 96 mmol/L — ABNORMAL LOW (ref 98–111)
Creatinine, Ser: 1 mg/dL (ref 0.44–1.00)
GFR, Estimated: 55 mL/min — ABNORMAL LOW (ref >60)
Glucose, Bld: 116 mg/dL — ABNORMAL HIGH (ref 70–99)
Potassium: 3.9 mmol/L (ref 3.5–5.1)
Sodium: 136 mmol/L (ref 135–145)

## 2023-06-03 NOTE — Progress Notes (Signed)
Physical Therapy Session Note  Patient Details  Name: Lisa [name] MRN: [id number] Date of Birth: 1934/01/15  Today's Date: 06/03/2023 PT Individual Time: 7425-9563 PT Individual Time Calculation (min): 55 min   Short Term Goals: Week 1:  PT Short Term Goal 1 (Week 1): Patient will perform bed mobility with MinA PT Short Term Goal 2 (Week 1): Patient will perform sit/stand with LRAD and MinA consistently PT Short Term Goal 3 (Week 1): Patient will ambulate >50' with LRAD and MinA  Skilled Therapeutic Interventions/Progress Updates:   Received pt semi-reclined in bed asleep. Upon wakening pt agreeable to PT treatment and denied any pain during session. Pt initially on 2L O2 with SPO2 93-94% and HR remained <90bpm throughout session - titrated to 1L and SPO2 87-91% with activity and pt feeling SOB; therefore increased back to 2L. Session with emphasis on functional mobility/transfers, generalized strengthening and endurance, dynamic standing balance/coordination, and gait training. Pt transferred semi-reclined<>sitting EOB with HOB elevated and use of bedrails with supervision. Donned shoes with max A and pt reported urge to toilet. Stood from elevated EOB with RW and heavy min A and ambulated in/out of bathroom with RW and CGA. Pt able to manage clothing standing with CGA/light min A. Pt continent of bladder and performed hygiene management without assist. Stood from toilet with bedside commode over top with RW and CGA and sat in WC and washed hands/brushed teeth with set up assist.   Pt transported to/from room in [facility] dependently for time management purposes. Pt stood from [facility] (pushing up with BUE support on WC armrests) with min A and transferred on/off Nustep with RW and min A. Pt performed BUE/LE strengthening on workload 3 for 1 minute 55 seconds x 1 and 1 minute x 1, then decreased workload to 1 and pt continued for additional 2 minutes with emphasis on cardiovascular endurance. Pt required  multiple extended rest breaks due to SOB. Pt then ambulated 8ft x 1 and 18ft x 1 with RW and CGA/min A to fatigue with assist to manage O2 tank. O2 dropped to 90% with ambulation but pt able to recover to >93% with seated rest and pursed lip breathing. Returned to room, stood from [facility] with RW and min A, and ambulated 70ft with RW and CGA to recliner. Concluded session with pt sitting in recliner, needs within reach, and chair pad alarm on.   Therapy Documentation Precautions:  Precautions Precautions: Fall Precaution Comments: 2L oxygen via [place]; HOH- No hearing aids Restrictions Weight Bearing Restrictions: No  Therapy/Group: Individual Therapy Lisa [name] PT, DPT 06/03/2023, 7:00 AM

## 2023-06-03 NOTE — Progress Notes (Signed)
PROGRESS NOTE   Subjective/Complaints: No new complaints this morning Discussed improvements on CXR results Patient asks why she can't go home, therapy notes reviewed and still requiring Min-Max A  ROS: Patient denies fever, chills, rash, chest pain, shortness of breath, nausea, vomiting, diarrhea, abdominal pain, +urinary frequency, denies dizziness  Objective:   DG Chest 2 View  Result Date: 06/02/2023 CLINICAL DATA:  [id number] Phlegm in throat [id number] EXAM: CHEST - 2 VIEW COMPARISON:  05/23/2023 FINDINGS: Previously noted multifocal pulmonary infiltrates have significantly improved with mild residual bibasilar atelectasis or infiltrate. There is central pulmonary vascular congestion without overt pulmonary edema again noted. No pneumothorax or pleural effusion. Cardiac size within normal limits. No acute bone abnormality. Surgical clips noted within the left axilla. IMPRESSION: 1. Significant interval improvement in multifocal pulmonary infiltrates with mild residual bibasilar atelectasis or infiltrate. Electronically Signed   By: Helyn Numbers M.D.   On: 06/02/2023 17:56   No results for input(s): "WBC", "HGB", "HCT", "PLT" in the last 72 hours.   Recent Labs    06/01/23 1059  NA 135  K 3.6  CL 93*  CO2 32  GLUCOSE 113*  BUN 19  CREATININE 0.93  CALCIUM 8.6*    Intake/Output Summary (Last 24 hours) at 06/03/2023 0906 Last data filed at 06/03/2023 0754 Gross per 24 hour  Intake 948 ml  Output 650 ml  Net 298 ml        Physical Exam: Vital Signs Blood pressure 112/61, pulse 64, temperature 97.9 F (36.6 C), resp. rate 16, height 5\' 3"  (1.6 m), weight 71.1 kg, SpO2 99%.  General:  NAD, BMI 27.77 Gen: no distress, normal appearing HEENT: oral mucosa pink and moist, NCAT Cardio: Reg rate Chest: normal effort, normal rate of breathing Abd: soft, non-distended Ext: no edema Psych: pleasant, normal affect Skin: Dry  without signs of breakdown Neuro: Alert and awake, follows commands,  Normal language and speech. Cranial nerve exam unremarkable. A little HOH. MMT: UE grossly 4 to 4+/5 prox to distal. LE 3+ prox to 4+/5 ADF/PF.  Sensation intact light touch in all 4 extremities. DTR's 1+. No abnl resting tone.   Musculoskeletal: chronic DJD in hands, knees and feet. Minimal joint pain with ROM   Assessment/Plan: 1. Functional deficits which require 3+ hours per day of interdisciplinary therapy in a comprehensive inpatient rehab setting. Physiatrist is providing close team supervision and 24 hour management of active medical problems listed below. Physiatrist and rehab team continue to assess barriers to discharge/monitor patient progress toward functional and medical goals  Care Tool:  Bathing    Body parts bathed by patient: Right arm, Left arm, Chest, Abdomen, Front perineal area, Buttocks, Face, Right upper leg, Left upper leg, Right lower leg, Left lower leg   Body parts bathed by helper: Left lower leg, Right lower leg     Bathing assist Assist Level: Minimal Assistance - Patient > 75%     Upper Body Dressing/Undressing Upper body dressing   What is the patient wearing?: Pull over shirt    Upper body assist Assist Level: Minimal Assistance - Patient > 75%    Lower Body Dressing/Undressing Lower body dressing  What is the patient wearing?: Pants, Incontinence brief     Lower body assist Assist for lower body dressing: Minimal Assistance - Patient > 75%     Toileting Toileting    Toileting assist Assist for toileting: Minimal Assistance - Patient > 75%     Transfers Chair/bed transfer  Transfers assist     Chair/bed transfer assist level: Minimal Assistance - Patient > 75%     Locomotion Ambulation   Ambulation assist      Assist level: Minimal Assistance - Patient > 75% Assistive device: Walker-rolling Max distance: 27ft   Walk 10 feet activity   Assist      Assist level: Minimal Assistance - Patient > 75% Assistive device: Walker-rolling   Walk 50 feet activity   Assist Walk 50 feet with 2 turns activity did not occur: Safety/medical concerns (Patient unable to ambulate >17' at this time secondary to global deconditioning and weakness)  Assist level: Minimal Assistance - Patient > 75% Assistive device: Walker-rolling    Walk 150 feet activity   Assist Walk 150 feet activity did not occur: Safety/medical concerns         Walk 10 feet on uneven surface  activity   Assist     Assist level: Minimal Assistance - Patient > 75% Assistive device: Walker-rolling   Wheelchair     Assist Is the patient using a wheelchair?: Yes Type of Wheelchair: Manual    Wheelchair assist level: Supervision/Verbal cueing Max wheelchair distance: 124ft    Wheelchair 50 feet with 2 turns activity    Assist        Assist Level: Supervision/Verbal cueing   Wheelchair 150 feet activity     Assist      Assist Level: Supervision/Verbal cueing   Blood pressure 112/61, pulse 64, temperature 97.9 F (36.6 C), resp. rate 16, height 5\' 3"  (1.6 m), weight 71.1 kg, SpO2 99%.   Medical Problem List and Plan: 1. Functional deficits secondary to debility secondary to acute on chronic hypoxic and hypercapnic respiratory failure/COPD exacerbation/CAP/acute diastolic heart failure/acute delirium..  Patient uses supplemental oxygen at baseline at home of 2 L.             -patient may shower             -ELOS/Goals: 14 days, mod I to supervision goals with PT and OT  -Continue CIR  IPOC completed  Discussed with therapy that she tolerated therapy very well today without symptoms, can continue therapy and have reached out to cardiology for HR parameters  Updated grandson 2.  Antithrombotics: -DVT/anticoagulation:  Mechanical: Antiembolism stockings, thigh (TED hose) Bilateral lower extremities             -antiplatelet therapy:  N/A 3. Pain Management: Oxycodone 5 mg every 6 hours as needed moderate pain  -7/28 pain controlled, not using oxycodone frequently 4. Mood/Behavior/Sleep: Remeron 15 mg nightly             -pt reports that she continues to struggle with sleep. We discussed melatonin                         -will begin 3mg  melatonin scheduled at bedtime             -antipsychotic agents: N/A 5. Neuropsych/cognition: This patient is capable of making decisions on her own behalf. 6. Skin/Wound Care: Routine skin checks 7. Fluids/Electrolytes/Nutrition: Routine and analysis with follow-up chemistries 8.  Hyperkalemia.  Resolved with aggressive treatments.  Her Lokelma has since been discontinued.  Patient had resumed her home oral dose of Lasix. -Recheck labs Monday 9.  Symptomatic chronic hyponatremia.  Nephrology follow-up.  Urine sodium 75 with 275 urine osmolality.  Plasma sodium normalizing -Follow-up chemistries, monitor intake -7/28 Recheck tomorrow 10.  Acute HFpEF exacerbation .  Imdur 30 mg daily,  Lasix 20 mg daily, Toprol XL 25 mg daily.  Monitor for any signs of fluid overload.  Daily weights  -7/28 Will asked nursing to check weights Filed Weights   06/01/23 0751 06/02/23 0432 06/03/23 0359  Weight: 72.4 kg 71.4 kg 71.1 kg    11.  Chronic orthostasis.  Florinef 0.1 mg daily.  Monitor with increased mobility 12.  MDS/CMML C/B neutropenia and thrombocytopenia.  Followed by hematology services Dr.Katragadda. 13.  Recent GI bleed.  Follow-up CBC on Monday - Protonix  daily for prophylaxis. 14.  Recent admission 6/4 - 6/7 for non-STEMI.  Followed by cardiology services Dr.Mallipeddi 15.  PAF.  Not a candidate for anticoagulation due to history of GI bleed.             -HR controlled    06/03/2023    7:57 AM 06/03/2023    4:42 AM 06/03/2023    3:59 AM  Vitals with BMI  Weight   156 lbs 12 oz  BMI   27.77  Systolic 112 138   Diastolic 61 78   Pulse 64 88     16.  Hypothyroidism.   Synthroid 17.  Hyperlipidemia.  Pravachol  18.  Urinary frequency.   -UA and reflex culture ordered, discussed that UA is negative  19. Hypotension: resolved, increase lasix back to 20mg , discussed with grand son dosing lasix based on standing weights. BP reviewed and is stable, maintain this dose  20. Hypokalemia: klor ordered x2 days this admission, repeat BMP today  21. Afib with RVR: cardiology consulted, continue lopressor, discussed her symptoms with cardiology, discussed that cardiology decided to maintain current metoprolol dose given her bradycardia, discussed that due to the afib she is at risk for hear rate spikes. Flowsheet reviewed and HR is currently stable  22. COPD: placed parameters for O2 to be titrated to goal of 88-92% or used prn for comfort   LOS: 6 days A FACE TO FACE EVALUATION WAS PERFORMED  Alexiss Iturralde P Oseias Horsey 06/03/2023, 9:06 AM

## 2023-06-03 NOTE — Group Note (Signed)
Patient Details Name: Lisa [name] MRN: [id number] DOB: 1934-05-20 Today's Date: 06/03/2023  Time Calculation: OT Group Time Calculation OT Group Start Time: 1430 OT Group Stop Time: 1530 OT Group Time Calculation (min): 60 min      Group Description: Dance Group: Pt participated in dance group with an emphasis on social interaction, motor planning, increasing overall activity tolerance and bimanual tasks. All songs were selected by group members. Dance moves included AROM of BUE/BLE gross motor movements with an emphasis on building functional endurance.    Individual level documentation: Patient completed group from sitting level. Patientt needed supervision to complete various dance moves with OT modeling each movement and cues for attention.  Patient needed min modifications during group.  Pain:  0/10  Precautions:  Azzie Almas 06/03/2023, 3:49 PM

## 2023-06-03 NOTE — Progress Notes (Signed)
Occupational Therapy Session Note  Patient Details  Name: Lisa [name] MRN: [id number] Date of Birth: 1934/05/29  Today's Date: 06/03/2023 OT Individual Time: 1050-1205 OT Individual Time Calculation (min): 75 min    Short Term Goals: Week 1:  OT Short Term Goal 1 (Week 1): patient will complete sit to stand using a RW for addiitonal balance at [employer] OT Short Term Goal 2 (Week 1): The pt will complete UB dressing for donning and doffing at MinA  at 95% safe OT Short Term Goal 3 (Week 1): The pt will bathe UB/LB at ModI  using A&E as needed at 95% safe after demonstration and initial instruction. OT Short Term Goal 4 (Week 1): The pt will complete LB dressing for donning and doffing at MinA  at 95% safe OT Short Term Goal 5 (Week 1): The pt will complete all stand pivot  transfers from one surface to another at [facility]  using the RW and grab bars at 95% safe.  Skilled Therapeutic Interventions/Progress Updates:  Skilled OT intervention completed with focus on ADL retraining and functional endurance within a shower context. Pt received seated in recliner, slumped over asleep. Easily woken, agreeable to session. No pain reported.  Pt requested to take a shower. Completed CGA sit > stand using RW from recliner, then ambulated with CGA using RW to [facility] over toilet. Min A to lower clothing, then pt was continent of urinary void only. Min A sit > stand with RW and CGA ambulatory transfer to shower with mod cues for hand placement and body positioning during side step using grab bars into shower to TTB.  Completed all bathing with min A for buttocks only in standing with min A for balance, and max cues needed for sequencing the stand with grab bars in shower. Able to wash all other parts with close supervision however declined hair washing this session. Improved initiation noted, however some cues needed for sequencing. Min A sit > stand and stand pivot with grab bar > w/c.   Pt completed UB dress with  supervision, with assist only for Shirley cord. Threaded pull up, pants with supervision, then CGA sit > stand at sink with CGA balance while donning over hips. Donned socks with set up A using figure 4 method. Shoes donned with max A as pt doesn't do this at baseline. Pt required intermittent seated rest breaks for fatigue and mild SOB throughout. With CGA for stand and transfer with RW, pt ambulated about 25 ft in room back to recliner in prep for lunch.  Pt remained seated in recliner,with BLE elevated, with chair pad alarm on/activated, and with all needs in reach at end of session.  Vitals -Received on 2L via [place], SPO2 96% -91% after shower and ambulation while on 2L but rebounded with extended rest. Remained on 2L at end of session   Therapy Documentation Precautions:  Precautions Precautions: Fall Precaution Comments: 2L oxygen via [place]; HOH- No hearing aids Restrictions Weight Bearing Restrictions: No   Therapy/Group: Individual Therapy  Melvyn Novas, MS, OTR/L  06/03/2023, 1:39 PM

## 2023-06-03 NOTE — Patient Care Conference (Signed)
Inpatient RehabilitationTeam Conference and Plan of Care Update Date: 06/02/2023   Time: 11:40    Patient Name: Lisa [name]      Medical Record Number: [id number]  Date of Birth: 21-Jan-1934 Sex: Female         Room/Bed: 4W20C/4W20C-01 Payor Info: Payor: MEDICARE / Plan: MEDICARE PART A AND B / Product Type: *No Product type* /    Admit Date/Time:  05/28/2023  2:25 PM  Primary Diagnosis:  Debility  Hospital Problems: Principal Problem:   Debility    Expected Discharge Date: Expected Discharge Date: 06/11/23  Team Members Present: Physician leading conference: Dr. Sula Soda Social Worker Present: Dossie Der, LCSW Nurse Present: Chana Bode, RN;Makena Murdock Marjo Bicker, RN PT Present: Raechel Chute, PT OT Present: Candee Furbish, OT PPS Coordinator present : Fae Pippin, SLP     Current Status/Progress Goal Weekly Team Focus  Bowel/Bladder   cont of B&B   remain cont of B&B   assist with toileting as needed    Swallow/Nutrition/ Hydration               ADL's   Min A UB, Max A LB, Max A toileting   Mod I, may need to downgrade for cognition   Barriers- general endurance and fear of falling that limits mobility progression. Plan for repetition with ambulating/sit > stands for confidence building    Mobility   bed mobility supervision, transfers with RW mod/max A, gait 40ft with RW and min A, WC mobility ~163ft supervision   Mod I/supervision  Barriers: stairs, global weakness/deconditioning    Communication                Safety/Cognition/ Behavioral Observations               Pain   no pain reported   remain pain free   assess pain qshift and PRN    Skin   skin intact   skin remain intact  assess skin qshift and PRN      Discharge Planning:  Discharging home with assistance from daughter and son to assist physically. Antony Haste is POA and lives OOS.  Son and daughter plan to care for patient 24/7.   Team Discussion: Debility. Green  phlegm this am.  Continue to encourage IS and flutter valve.  C/O chest pain/dizziness. Daily weights. HOH.  Fatigues quickly.  Fear of falling. Working on stairs. Will need family education. Wears O2 at baseline.  Patient on target to meet rehab goals: Some goals downgraded to supervision with discharge 06/11/23  *See Care Plan and progress notes for long and short-term goals.   Revisions to Treatment Plan:  Chest xray.  Cardiology consulted.  Standing weights.   Teaching Needs: Medications, safety, self care, gait/transfer training, etc.   Current Barriers to Discharge: Decreased caregiver support and Weight  Possible Resolutions to Barriers: Family education Adhere to diet modifications Order recommended equipment     Medical Summary Current Status: overweight, fluctuating blood pressure, bradycardia, green phelgm production, intermittent shortness of breath  Barriers to Discharge: Medical stability  Barriers to Discharge Comments: overweight, fluctuating blood pressure, bradycardia, green phelgm production, intermittent shortness of breath Possible Resolutions to [employer] Focus: provide dietary education, adjusting lasix based on blood pressure and weight, chest XR ordered, O2 sats and HR monitoring TID, albuterol ordered prn, wean O2 as tolerated   Continued Need for Acute Rehabilitation Level of Care: The patient requires daily medical management by a physician with specialized training in physical medicine and rehabilitation for  the following reasons: Direction of a multidisciplinary physical rehabilitation program to maximize functional independence : Yes Medical management of patient stability for increased activity during participation in an intensive rehabilitation regime.: Yes Analysis of laboratory values and/or radiology reports with any subsequent need for medication adjustment and/or medical intervention. : Yes   I attest that I was present, lead the team  conference, and concur with the assessment and plan of the team.   Jearld Adjutant 06/03/2023, 9:50 AM

## 2023-06-03 NOTE — Plan of Care (Signed)
  Problem: Education: Goal: Knowledge of disease or condition will improve Outcome: Progressing Goal: Knowledge of the prescribed therapeutic regimen will improve Outcome: Progressing Goal: Individualized Educational Video(s) Outcome: Progressing   Problem: Activity: Goal: Ability to tolerate increased activity will improve Outcome: Progressing Goal: Will verbalize the importance of balancing activity with adequate rest periods Outcome: Progressing   Problem: Respiratory: Goal: Ability to maintain a clear airway will improve Outcome: Progressing Goal: Levels of oxygenation will improve Outcome: Progressing Goal: Ability to maintain adequate ventilation will improve Outcome: Progressing   Problem: Education: Goal: Knowledge of General Education information will improve Description: Including pain rating scale, medication(s)/side effects and non-pharmacologic comfort measures Outcome: Progressing   Problem: Health Behavior/Discharge Planning: Goal: Ability to manage health-related needs will improve Outcome: Progressing   Problem: Clinical Measurements: Goal: Ability to maintain clinical measurements within normal limits will improve Outcome: Progressing Goal: Will remain free from infection Outcome: Progressing Goal: Diagnostic test results will improve Outcome: Progressing Goal: Respiratory complications will improve Outcome: Progressing Goal: Cardiovascular complication will be avoided Outcome: Progressing   Problem: Activity: Goal: Risk for activity intolerance will decrease Outcome: Progressing   Problem: Nutrition: Goal: Adequate nutrition will be maintained Outcome: Progressing   Problem: Coping: Goal: Level of anxiety will decrease Outcome: Progressing   Problem: Elimination: Goal: Will not experience complications related to bowel motility Outcome: Progressing Goal: Will not experience complications related to urinary retention Outcome: Progressing

## 2023-06-03 NOTE — Progress Notes (Signed)
Patient ID: Lisa [name], female   DOB: [birth year], 89 y.o.   MRN: [id number]  Family education arranged tomorrow 1-4 PM.

## 2023-06-04 MED ORDER — MAGNESIUM GLUCONATE 500 MG PO TABS
250.0000 mg | ORAL_TABLET | Freq: Every day | ORAL | Status: DC
  Administered 2023-06-04 – 2023-06-10 (×7): 250 mg via ORAL
  Filled 2023-06-04 (×7): qty 1

## 2023-06-04 NOTE — Plan of Care (Signed)
  Problem: Education: Goal: Knowledge of disease or condition will improve Outcome: Progressing   Problem: Activity: Goal: Ability to tolerate increased activity will improve Outcome: Progressing Goal: Will verbalize the importance of balancing activity with adequate rest periods Outcome: Progressing   Problem: Respiratory: Goal: Ability to maintain a clear airway will improve Outcome: Progressing Goal: Ability to maintain adequate ventilation will improve Outcome: Progressing   Problem: Activity: Goal: Risk for activity intolerance will decrease Outcome: Progressing   Problem: Nutrition: Goal: Adequate nutrition will be maintained Outcome: Progressing   Problem: Coping: Goal: Level of anxiety will decrease Outcome: Progressing   Problem: Elimination: Goal: Will not experience complications related to bowel motility Outcome: Progressing Goal: Will not experience complications related to urinary retention Outcome: Progressing

## 2023-06-04 NOTE — Progress Notes (Deleted)
Patient ID: Lisa [name], female   DOB: 1933-11-30, 89 y.o.   MRN: [id number]  Patient approved by [facility] PT/OT/SLP.

## 2023-06-04 NOTE — Progress Notes (Addendum)
PROGRESS NOTE   Subjective/Complaints: Would like to go home, family ed scheduled today, messaged to team to discuss how she does with family ed today and whether family feels comfortable with providing physical assist  ROS: Patient denies fever, chills, rash, chest pain, shortness of breath, nausea, vomiting, diarrhea, abdominal pain, +urinary frequency, denies dizziness, lower extremity edema  Objective:   DG Chest 2 View  Result Date: 06/02/2023 CLINICAL DATA:  119147 Phlegm in throat [id number] EXAM: CHEST - 2 VIEW COMPARISON:  05/23/2023 FINDINGS: Previously noted multifocal pulmonary infiltrates have significantly improved with mild residual bibasilar atelectasis or infiltrate. There is central pulmonary vascular congestion without overt pulmonary edema again noted. No pneumothorax or pleural effusion. Cardiac size within normal limits. No acute bone abnormality. Surgical clips noted within the left axilla. IMPRESSION: 1. Significant interval improvement in multifocal pulmonary infiltrates with mild residual bibasilar atelectasis or infiltrate. Electronically Signed   By: Helyn Numbers M.D.   On: 06/02/2023 17:56   No results for input(s): "WBC", "HGB", "HCT", "PLT" in the last 72 hours.   Recent Labs    06/01/23 1059 06/03/23 1002  NA 135 136  K 3.6 3.9  CL 93* 96*  CO2 32 30  GLUCOSE 113* 116*  BUN 19 22  CREATININE 0.93 1.00  CALCIUM 8.6* 8.5*    Intake/Output Summary (Last 24 hours) at 06/04/2023 1058 Last data filed at 06/04/2023 0824 Gross per 24 hour  Intake 594 ml  Output 750 ml  Net -156 ml        Physical Exam: Vital Signs Blood pressure (!) 141/72, pulse 75, temperature 98 F (36.7 C), temperature source Oral, resp. rate 18, height 5\' 3"  (1.6 m), weight 71.1 kg, SpO2 96%.  General:  NAD, BMI 27.77 Gen: no distress, normal appearing HEENT: oral mucosa pink and moist, NCAT Cardio: Reg rate Chest: normal  effort, normal rate of breathing Abd: soft, non-distended Ext: no edema Psych: pleasant, normal affect Skin: Dry without signs of breakdown Neuro: Alert and awake, follows commands,  Normal language and speech. Cranial nerve exam unremarkable. A little HOH. MMT: UE grossly 4 to 4+/5 prox to distal. LE 3+ prox to 4+/5 ADF/PF.  Sensation intact light touch in all 4 extremities. DTR's 1+. No abnl resting tone.   Musculoskeletal: chronic DJD in hands, knees and feet. Minimal joint pain with ROM  Ambulating CG Assessment/Plan: 1. Functional deficits which require 3+ hours per day of interdisciplinary therapy in a comprehensive inpatient rehab setting. Physiatrist is providing close team supervision and 24 hour management of active medical problems listed below. Physiatrist and rehab team continue to assess barriers to discharge/monitor patient progress toward functional and medical goals  Care Tool:  Bathing    Body parts bathed by patient: Right arm, Left arm, Chest, Abdomen, Front perineal area, Face, Right upper leg, Left upper leg, Right lower leg, Left lower leg   Body parts bathed by helper: Buttocks     Bathing assist Assist Level: Minimal Assistance - Patient > 75%     Upper Body Dressing/Undressing Upper body dressing   What is the patient wearing?: Pull over shirt    Upper body assist Assist Level: Supervision/Verbal cueing  Lower Body Dressing/Undressing Lower body dressing      What is the patient wearing?: Pants, Incontinence brief     Lower body assist Assist for lower body dressing: Contact Guard/Touching assist     Toileting Toileting    Toileting assist Assist for toileting: Contact Guard/Touching assist     Transfers Chair/bed transfer  Transfers assist     Chair/bed transfer assist level: Minimal Assistance - Patient > 75%     Locomotion Ambulation   Ambulation assist      Assist level: Minimal Assistance - Patient > 75% Assistive  device: Walker-rolling Max distance: 72ft   Walk 10 feet activity   Assist     Assist level: Minimal Assistance - Patient > 75% Assistive device: Walker-rolling   Walk 50 feet activity   Assist Walk 50 feet with 2 turns activity did not occur: Safety/medical concerns (Patient unable to ambulate >17' at this time secondary to global deconditioning and weakness)  Assist level: Minimal Assistance - Patient > 75% Assistive device: Walker-rolling    Walk 150 feet activity   Assist Walk 150 feet activity did not occur: Safety/medical concerns         Walk 10 feet on uneven surface  activity   Assist     Assist level: Minimal Assistance - Patient > 75% Assistive device: Walker-rolling   Wheelchair     Assist Is the patient using a wheelchair?: Yes Type of Wheelchair: Manual    Wheelchair assist level: Supervision/Verbal cueing Max wheelchair distance: 129ft    Wheelchair 50 feet with 2 turns activity    Assist        Assist Level: Supervision/Verbal cueing   Wheelchair 150 feet activity     Assist      Assist Level: Supervision/Verbal cueing   Blood pressure (!) 141/72, pulse 75, temperature 98 F (36.7 C), temperature source Oral, resp. rate 18, height 5\' 3"  (1.6 m), weight 71.1 kg, SpO2 96%.   Medical Problem List and Plan: 1. Functional deficits secondary to debility secondary to acute on chronic hypoxic and hypercapnic respiratory failure/COPD exacerbation/CAP/acute diastolic heart failure/acute delirium..  Patient uses supplemental oxygen at baseline at home of 2 L.             -patient may shower             -ELOS/Goals: 14 days, mod I to supervision goals with PT and OT  -Continue CIR  IPOC completed  Discussed with therapy that she tolerated therapy very well today without symptoms, can continue therapy and have reached out to cardiology for HR parameters  Updated grandson  Sent message to team to discuss progress, family ed, and  whether she can d/c home earlier 2.  Antithrombotics: -DVT/anticoagulation:  Mechanical: Antiembolism stockings, thigh (TED hose) Bilateral lower extremities             -antiplatelet therapy: N/A 3. Pain Management: Oxycodone 5 mg every 6 hours as needed moderate pain  -7/28 pain controlled, not using oxycodone frequently 4. Mood/Behavior/Sleep: Remeron 15 mg nightly             -pt reports that she continues to struggle with sleep. We discussed melatonin                         -will begin 3mg  melatonin scheduled at bedtime             -antipsychotic agents: N/A 5. Neuropsych/cognition: This patient is capable of making decisions  on her own behalf. 6. Skin/Wound Care: Routine skin checks 7. Fluids/Electrolytes/Nutrition: Routine and analysis with follow-up chemistries 8.  Hyperkalemia.  Resolved with aggressive treatments.  Her Lokelma has since been discontinued.  Patient had resumed her home oral dose of Lasix. -Recheck labs Monday 9.  Symptomatic chronic hyponatremia.  Nephrology follow-up.  Urine sodium 75 with 275 urine osmolality.  Plasma sodium normalizing -Follow-up chemistries, monitor intake -7/28 Recheck tomorrow 10.  Acute HFpEF exacerbation .  Imdur 30 mg daily,  Lasix 20 mg daily, Toprol XL 25 mg daily.  Monitor for any signs of fluid overload.  Daily weights  -7/28 Will asked nursing to check weights Filed Weights   06/01/23 0751 06/02/23 0432 06/03/23 0359  Weight: 72.4 kg 71.4 kg 71.1 kg    11.  Chronic orthostasis.  Florinef 0.1 mg daily.  Monitor with increased mobility 12.  MDS/CMML C/B neutropenia and thrombocytopenia.  Followed by hematology services Dr.Katragadda. 13.  Recent GI bleed.  Follow-up CBC on Monday - Protonix  daily for prophylaxis. 14.  Recent admission 6/4 - 6/7 for non-STEMI.  Followed by cardiology services Dr.Mallipeddi 15.  PAF.  Not a candidate for anticoagulation due to history of GI bleed.             -HR controlled    06/04/2023    3:55  AM 06/03/2023    8:15 PM 06/03/2023    3:42 PM  Vitals with BMI  Systolic 141 137 528  Diastolic 72 75 77  Pulse 75 63 70    16.  Hypothyroidism.  Synthroid 17.  Hyperlipidemia.  Pravachol  18.  Urinary frequency.   -UA and reflex culture ordered, discussed that UA is negative  19. Hypotension: resolved, continue lasix 20mg , discussed with grand son dosing lasix based on standing weights. BP reviewed and is stable, maintain this dose  20. Hypokalemia: klor ordered x2 days this admission, repeat Monday  21. Afib with RVR: cardiology consulted, continue lopressor, discussed her symptoms with cardiology, discussed that cardiology decided to maintain current metoprolol dose given her bradycardia, discussed that due to the afib she is at risk for hear rate spikes. Flowsheet reviewed and HR is currently stable, add magnesium gluconate 250mg  HS  22. COPD: placed parameters for O2 to be titrated to goal of 88-92% or used prn for comfort, attempted wean to 1L but desatted to 86%  I spent >54mins performing patient care related activities, including face to face time, documentation time, med management, discussion of meds and lab orders with patient, and overall coordination of care.   LOS: 7 days A FACE TO FACE EVALUATION WAS PERFORMED  Drema Pry  06/04/2023, 10:58 AM

## 2023-06-04 NOTE — Group Note (Signed)
Patient Details Name: Lisa [name] MRN: [id number] DOB: [birth year] Today's Date: 06/04/2023  Time Calculation: OT Group Time Calculation OT Group Start Time: 1100 OT Group Stop Time: 1200 OT Group Time Calculation (min): 60 min      Group Description: Fine motor: Pt participated in group session with a focus on [facility], functional reach, problem solving, visual attention, and social interaction. Pt actively participated in group game of UNO  where pts were instructed on rules of game. Pt using BUEs to engage in activity. Pts participated in game from seated position with anterior reaching and trunk flexion facilitated during task. Pt utilized below compensatory methods to engage in task hand over hand assist and increased time.   Individual level documentation: Pt able to tolerate task at seated level with no rest breaks required. Pt required mod-max verbal cues for problem solving and, awareness, and attention. Pt overall required max assist level to complete task d/t cognitive deficits. No pain reported during task.     Army Fossa 06/04/2023, 12:22 PM

## 2023-06-04 NOTE — Progress Notes (Signed)
Physical Therapy Session Note  Patient Details  Name: Lisa [name] MRN: [id number] Date of Birth: [birth year]  Today's Date: 06/04/2023 PT Individual Time: [phone] and 1510-1535 PT Individual Time Calculation (min): 60 min and 25 min  Short Term Goals: Week 1:  PT Short Term Goal 1 (Week 1): Patient will perform bed mobility with MinA PT Short Term Goal 2 (Week 1): Patient will perform sit/stand with LRAD and MinA consistently PT Short Term Goal 3 (Week 1): Patient will ambulate >50' with LRAD and MinA  Skilled Therapeutic Interventions/Progress Updates: Pt presented in bed agreeable to therapy. Pt denies pain during session. Session focused on functional mobility to improve endurance. Pt completed bed mobility with supervision and use of bed features. PTA threaded pants for time management. Performed Sit to stand with modA from elevated mat and pt was able to complete clothing management with light CGA. Pt then ambulated to w/c ~109ft with CGA. Pt then transported to main gym for energy conservation and completed step pivot transfer to high/low mat with modA to stand and CGA to complete transfer. Pt then performed Sit to stand x 5 from slightly elevated mat with modA fading to minA. Pt required cues for hand placement and PTA providing facilitation to increase anterior weight shifting. Pt's SpO2 remained >90% through this activity therefore supplemental O2 dropped to 1L.Pt then performed ball taps with 2lb dowel 2 x 20 for activity tolerance.  Pt then ambulated around main gym through hallway to return to mat. SpO2 checked again initially 88% then dropping to 85% therefore supplemental O2 increased to 2L. After seated rest break pt ambulated to stairs and ascended/descended x 4 steps with B rails and CGA with step to pattern. SpO2 >88% after activity on 2L. Pt transported back to room at end of session and pt performed ambulatory transfer to recliner from w/c with RW and minA to stand and CGA for gait.  Pt left in recliner at end of session with call bell within reach and needs met.   Tx2: Pt presented in recliner agreeable to therapy. Pt's family not present for family ed, sent message to Trula Ore LSW to follow up with dgt for reschedule for family ed. Pt denies pain during session. Session focused on ambulation for increased endurance. PTA donned shoes total A for time management. Performed Sit to stand with heavy minA requiring cues for increased anterior weight shifting. Pt then ambulated ~44ft to w/c in hallway then transported remaining distance to day room. In day room pt ambulated 14ft x 2 with seated rest between bouts. Pt required minA to stand from w/c with heavy multimodal cues for increased anterior weight shifting. Pt maintained on 2L supplemental O2 throughout session with SpO2>90% throughout. Pt transported back to room at end of session and performed ambulatory transfer with pt able to perform Sit to stand from w/c with CGA!. Pt returned to recliner at end of session and left with belt alarm on, call bell within reach and needs met.      Therapy Documentation Precautions:  Precautions Precautions: Fall Precaution Comments: 2L oxygen via [place]; HOH- No hearing aids Restrictions Weight Bearing Restrictions: No    Therapy/Group: Individual Therapy    06/04/2023, 12:56 PM

## 2023-06-04 NOTE — Progress Notes (Signed)
Occupational Therapy Session Note  Patient Details  Name: Lisa [name] MRN: [id number] Date of Birth: 1934-05-31  Today's Date: 06/04/2023 OT Individual Time: 1415-1458 OT Individual Time Calculation (min): 43 min    Short Term Goals: Week 1:  OT Short Term Goal 1 (Week 1): patient will complete sit to stand using a RW for addiitonal balance at [employer] OT Short Term Goal 2 (Week 1): The pt will complete UB dressing for donning and doffing at MinA  at 95% safe OT Short Term Goal 3 (Week 1): The pt will bathe UB/LB at ModI  using A&E as needed at 95% safe after demonstration and initial instruction. OT Short Term Goal 4 (Week 1): The pt will complete LB dressing for donning and doffing at MinA  at 95% safe OT Short Term Goal 5 (Week 1): The pt will complete all stand pivot  transfers from one surface to another at [facility]  using the RW and grab bars at 95% safe.  Skilled Therapeutic Interventions/Progress Updates:    Patient received seated in recliner.  Family not present, patient reports family just left.  "We weren't sure you were going to come."  Explained that I was there at scheduled time.  PT session immediately following OT schedule to accommodate family education.   Received message from MD that patient expressing desire to leave earlier than originally planned.  Spoke with patient who indicates she is at the level she was prior to hospitalization.  Patient needing min assist initially to stand from sturdy recliner.  Mod assist to stand from bed.  Patient indicated prior to hospitalization, she could bath and dress herself and get to bathroom without assistance.  Currently, patient demonstrates LE weakness, and decreased endurance.  Patient may also have limited awareness of current deficits.  Patient indicated on three occasions that she was 89 yrs old.  When questioned she stated I am 35, and my sister is right behind me at 18.  Later patient indicated her correct age.  Patient indicated no  need to void, but agreeable to walk from recliner into hallway with RW  and 2l O2 via [place] and contact guard assist.  Worked on sit to stand x 5 with emphasis on pushing up from chair, leaning forward and no additional assistance.  Patient unable to do 5 sit to stands without significant rest breaks between.   Patient left up in recliner with safety belt in place and call bell/ personal items in reach.    Therapy Documentation Precautions:  Precautions Precautions: Fall Precaution Comments: 2L oxygen via [place]; HOH- No hearing aids Restrictions Weight Bearing Restrictions: No   Vital Signs: Therapy Vitals Temp: 98.1 F (36.7 C) Temp Source: Oral Pulse Rate: 67 Resp: 16 BP: (!) 151/82 Patient Position (if appropriate): Sitting Oxygen Therapy SpO2: 99 % O2 Device: Nasal Cannula O2 Flow Rate (L/min): 2 L/min Patient Activity (if Appropriate): In chair Pulse Oximetry Type: Continuous Pain:  Denies pain    Therapy/Group: Individual Therapy  Collier Salina 06/04/2023, 3:07 PM

## 2023-06-04 NOTE — Progress Notes (Signed)
Patient ID: Lisa [name], female   DOB: 08-Sep-1934, 89 y.o.   MRN: [id number]  [facility] referral sent to [facility].

## 2023-06-05 NOTE — Progress Notes (Signed)
PROGRESS NOTE   Subjective/Complaints: Sleepy this morning Tolerated OT well today  ROS: Patient denies fever, chills, rash, chest pain, shortness of breath, nausea, vomiting, diarrhea, abdominal pain, +urinary frequency, denies dizziness, lower extremity edema  Objective:   No results found. No results for input(s): "WBC", "HGB", "HCT", "PLT" in the last 72 hours.   Recent Labs    06/03/23 1002  NA 136  K 3.9  CL 96*  CO2 30  GLUCOSE 116*  BUN 22  CREATININE 1.00  CALCIUM 8.5*    Intake/Output Summary (Last 24 hours) at 06/05/2023 1320 Last data filed at 06/05/2023 0755 Gross per 24 hour  Intake 960 ml  Output 100 ml  Net 860 ml        Physical Exam: Vital Signs Blood pressure (!) 143/66, pulse 73, temperature (!) 97.3 F (36.3 C), resp. rate 17, height 5\' 3"  (1.6 m), weight 71 kg, SpO2 97%.  General:  NAD, BMI 27.73 Gen: no distress, normal appearing HEENT: oral mucosa pink and moist, NCAT Cardio: Reg rate Chest: normal effort, normal rate of breathing Abd: soft, non-distended Ext: no edema Psych: pleasant, normal affect Skin: Dry without signs of breakdown Neuro: Alert and awake, follows commands,  Normal language and speech. Cranial nerve exam unremarkable. A little HOH. MMT: UE grossly 4 to 4+/5 prox to distal. LE 3+ prox to 4+/5 ADF/PF.  Sensation intact light touch in all 4 extremities. DTR's 1+. No abnl resting tone.   Musculoskeletal: chronic DJD in hands, knees and feet. Minimal joint pain with ROM  Ambulating CG Assessment/Plan: 1. Functional deficits which require 3+ hours per day of interdisciplinary therapy in a comprehensive inpatient rehab setting. Physiatrist is providing close team supervision and 24 hour management of active medical problems listed below. Physiatrist and rehab team continue to assess barriers to discharge/monitor patient progress toward functional and medical  goals  Care Tool:  Bathing    Body parts bathed by patient: Right arm, Left arm, Chest, Abdomen, Front perineal area, Face, Right upper leg, Left upper leg, Right lower leg, Left lower leg   Body parts bathed by helper: Buttocks     Bathing assist Assist Level: Minimal Assistance - Patient > 75%     Upper Body Dressing/Undressing Upper body dressing   What is the patient wearing?: Pull over shirt    Upper body assist Assist Level: Supervision/Verbal cueing    Lower Body Dressing/Undressing Lower body dressing      What is the patient wearing?: Pants, Incontinence brief     Lower body assist Assist for lower body dressing: Contact Guard/Touching assist     Toileting Toileting    Toileting assist Assist for toileting: Contact Guard/Touching assist     Transfers Chair/bed transfer  Transfers assist     Chair/bed transfer assist level: Minimal Assistance - Patient > 75%     Locomotion Ambulation   Ambulation assist      Assist level: Minimal Assistance - Patient > 75% Assistive device: Walker-rolling Max distance: 20ft   Walk 10 feet activity   Assist     Assist level: Minimal Assistance - Patient > 75% Assistive device: Walker-rolling   Walk 50 feet activity  Assist Walk 50 feet with 2 turns activity did not occur: Safety/medical concerns (Patient unable to ambulate >17' at this time secondary to global deconditioning and weakness)  Assist level: Minimal Assistance - Patient > 75% Assistive device: Walker-rolling    Walk 150 feet activity   Assist Walk 150 feet activity did not occur: Safety/medical concerns         Walk 10 feet on uneven surface  activity   Assist     Assist level: Minimal Assistance - Patient > 75% Assistive device: Walker-rolling   Wheelchair     Assist Is the patient using a wheelchair?: Yes Type of Wheelchair: Manual    Wheelchair assist level: Supervision/Verbal cueing Max wheelchair distance:  158ft    Wheelchair 50 feet with 2 turns activity    Assist        Assist Level: Supervision/Verbal cueing   Wheelchair 150 feet activity     Assist      Assist Level: Supervision/Verbal cueing   Blood pressure (!) 143/66, pulse 73, temperature (!) 97.3 F (36.3 C), resp. rate 17, height 5\' 3"  (1.6 m), weight 71 kg, SpO2 97%.   Medical Problem List and Plan: 1. Functional deficits secondary to debility secondary to acute on chronic hypoxic and hypercapnic respiratory failure/COPD exacerbation/CAP/acute diastolic heart failure/acute delirium..  Patient uses supplemental oxygen at baseline at home of 2 L.             -patient may shower             -ELOS/Goals: 14 days, mod I to supervision goals with PT and OT  -Continue CIR  IPOC completed  Discussed with therapy that she tolerated therapy very well today without symptoms, can continue therapy and have reached out to cardiology for HR parameters  Updated grandson  Sent message to team to discuss progress, family ed, and whether she can d/c home earlier 2.  Antithrombotics: -DVT/anticoagulation:  Mechanical: Antiembolism stockings, thigh (TED hose) Bilateral lower extremities             -antiplatelet therapy: N/A 3. Pain Management: Oxycodone 5 mg every 6 hours as needed moderate pain  -7/28 pain controlled, not using oxycodone frequently 4. Mood/Behavior/Sleep: Remeron 15 mg nightly             -pt reports that she continues to struggle with sleep. We discussed melatonin                         -will begin 3mg  melatonin scheduled at bedtime             -antipsychotic agents: N/A 5. Neuropsych/cognition: This patient is capable of making decisions on her own behalf. 6. Skin/Wound Care: Routine skin checks 7. Fluids/Electrolytes/Nutrition: Routine and analysis with follow-up chemistries 8.  Hyperkalemia.  Resolved with aggressive treatments.  Her Lokelma has since been discontinued.  Patient had resumed her home oral  dose of Lasix. -Recheck labs Monday 9.  Symptomatic chronic hyponatremia.  Nephrology follow-up.  Urine sodium 75 with 275 urine osmolality.  Plasma sodium normalizing -Follow-up chemistries, monitor intake -7/28 Recheck tomorrow 10.  Acute HFpEF exacerbation .  Imdur 30 mg daily,  Lasix 20 mg daily, Toprol XL 25 mg daily.  Monitor for any signs of fluid overload.  Daily weights  -7/28 Will asked nursing to check weights Filed Weights   06/02/23 0432 06/03/23 0359 06/05/23 0620  Weight: 71.4 kg 71.1 kg 71 kg    11.  Chronic orthostasis.  Florinef 0.1 mg daily.  Monitor with increased mobility 12.  MDS/CMML C/B neutropenia and thrombocytopenia.  Followed by hematology services Dr.Katragadda. 13.  Recent GI bleed.  Follow-up CBC on Monday - Protonix  daily for prophylaxis. 14.  Recent admission 6/4 - 6/7 for non-STEMI.  Followed by cardiology services Dr.Mallipeddi 15.  PAF.  Not a candidate for anticoagulation due to history of GI bleed.             -HR controlled    06/05/2023    6:20 AM 06/05/2023    3:44 AM 06/05/2023    3:00 AM  Vitals with BMI  Weight 156 lbs 8 oz    BMI 27.73    Systolic 143 180 259  Diastolic 66 88 93  Pulse   73    16.  Hypothyroidism.  Synthroid 17.  Hyperlipidemia.  Pravachol  18.  Urinary frequency.   -UA and reflex culture ordered, discussed that UA is negative  19. Hypotension: resolved, continue lasix 20mg , discussed with grand son dosing lasix based on standing weights. BP reviewed and is stable, maintain this dose  20. Hypokalemia: klor ordered x2 days this admission, repeat Monday  21. Afib with RVR: cardiology consulted, continue lopressor, discussed her symptoms with cardiology, discussed that cardiology decided to maintain current metoprolol dose given her bradycardia, discussed that due to the afib she is at risk for hear rate spikes. Flowsheet reviewed and HR is currently stable, increase magnesium gluconate to 500mg  HS  22. COPD:  placed parameters for O2 to be titrated to goal of 88-92% or used prn for comfort, attempted wean to 1L but desatted to 86%  23. Overweight: provide dietary education.     24. History of magnesium deficiency: increase magnesium gluconate to 500mg  HS  25. Constipation: increase magnesium gluconate to 500mg  HS LOS: 8 days A FACE TO FACE EVALUATION WAS PERFORMED   P  06/05/2023, 1:20 PM

## 2023-06-05 NOTE — Progress Notes (Signed)
Occupational Therapy Session Note  Patient Details  Name: Lisa [name] MRN: [id number] Date of Birth: 1934/01/24  Today's Date: 06/05/2023 OT Individual Time: 0917-1000 OT Individual Time Calculation (min): 43 min    Short Term Goals: Week 1:  OT Short Term Goal 1 (Week 1): patient will complete sit to stand using a RW for addiitonal balance at [employer] OT Short Term Goal 2 (Week 1): The pt will complete UB dressing for donning and doffing at MinA  at 95% safe OT Short Term Goal 3 (Week 1): The pt will bathe UB/LB at [facility]  using A&E as needed at 95% safe after demonstration and initial instruction. OT Short Term Goal 4 (Week 1): The pt will complete LB dressing for donning and doffing at [facility]  at 95% safe OT Short Term Goal 5 (Week 1): The pt will complete all stand pivot  transfers from one surface to another at [facility]  using the RW and grab bars at 95% safe.  Skilled Therapeutic Interventions/Progress Updates:     Pt received sitting up in recliner with 2L02 donned and maintained throughout session. Pt presenting to be in good spirits receptive to skilled OT session reporting 0/10 pain- OT offering intermittent rest breaks, repositioning, and therapeutic support to optimize participation in therapy session. Focus this session BADL retraining to increase Pt's independence and decrease bourdon of care, UB strengthening, and sit<>stand transfers. Pt requesting to get dressed and complete morning routine this AM. Pt doffed dirty shirt and donned clean shirt with supervision. Pt completed sit>stand from low recliner with mod A required to power up and doffed pants in standing using RW for balance CGA. Pt noted to have narrow BOS and decreased anterior trunk flexion when attempting to rise to stand. Simple education provided on sit<>stand technique with Pt receptive and demonstrating teach back as evidence of learning. Pt able to weave B LEs into pants with supervision. Sit<>stand mod A from low recliner  with Pt able to bring pants to waist while standing with CGA alternating using R/L UE to pull up pants. Pt completed functional mobility ~10 ft to wc with CGA and min verbal cues provided for trunk positioning. Pt noted to be SOB following ambulation with education and demonstration provided on PLB'ing. Pt requiring mod verbal cues to utilize technique and would benefit from continued education on this to support learning. Seated at sink, Pt able to complete oral hygiene, wash face, and groom hair with supervision. Simple education provided energy conservation strategies with emphasis on providing self increased time and completing task seated when able with Pt verbalizing understanding and receptive to education. Engaged Pt in seated UB exercises using body weight alone to increase overall strength and endurance for BADLs. Pt complete 2x10 reps alternating cross body punched, bicep flexion, shoulder flexion, and lateral chest pulls. Pt required increased time and demonstration to complete exercises. Worked on sit<>stands x4 trials with emphasis on pushing up from chair, leaning forward, feet positioning, and controled movements. Pt initially requiring CGA progressing to supervision with increased practice. Pt completed functional mobility ~10 ft wc>recliner with supervision and min verbal cues required for trunk positioning and safety. Pt was left resting in recliner with call bell in reach, seat belt alarm on, and all needs met.    Therapy Documentation Precautions:  Precautions Precautions: Fall Precaution Comments: 2L oxygen via [place]; HOH- No hearing aids Restrictions Weight Bearing Restrictions: No   Therapy/Group: Individual Therapy  Army Fossa 06/05/2023, 8:00 AM

## 2023-06-05 NOTE — Plan of Care (Signed)
  Problem: Education: Goal: Knowledge of disease or condition will improve Outcome: Progressing Goal: Knowledge of the prescribed therapeutic regimen will improve Outcome: Progressing Goal: Individualized Educational Video(s) Outcome: Progressing   Problem: Activity: Goal: Ability to tolerate increased activity will improve Outcome: Progressing Goal: Will verbalize the importance of balancing activity with adequate rest periods Outcome: Progressing   Problem: Respiratory: Goal: Ability to maintain a clear airway will improve Outcome: Progressing Goal: Levels of oxygenation will improve Outcome: Progressing Goal: Ability to maintain adequate ventilation will improve Outcome: Progressing   Problem: Education: Goal: Knowledge of General Education information will improve Description: Including pain rating scale, medication(s)/side effects and non-pharmacologic comfort measures Outcome: Progressing   Problem: Health Behavior/Discharge Planning: Goal: Ability to manage health-related needs will improve Outcome: Progressing   Problem: Clinical Measurements: Goal: Ability to maintain clinical measurements within normal limits will improve Outcome: Progressing Goal: Will remain free from infection Outcome: Progressing Goal: Diagnostic test results will improve Outcome: Progressing Goal: Respiratory complications will improve Outcome: Progressing Goal: Cardiovascular complication will be avoided Outcome: Progressing   Problem: Activity: Goal: Risk for activity intolerance will decrease Outcome: Progressing   Problem: Nutrition: Goal: Adequate nutrition will be maintained Outcome: Progressing   Problem: Coping: Goal: Level of anxiety will decrease Outcome: Progressing   Problem: Elimination: Goal: Will not experience complications related to bowel motility Outcome: Progressing Goal: Will not experience complications related to urinary retention Outcome: Progressing

## 2023-06-05 NOTE — Progress Notes (Signed)
Physical Therapy Session Note  Patient Details  Name: Lisa [name] MRN: [id number] Date of Birth: 04-23-1934  Today's Date: 06/05/2023 PT Individual Time: 1350-1415 PT Individual Time Calculation (min): 25 min   Short Term Goals: Week 1:  PT Short Term Goal 1 (Week 1): Patient will perform bed mobility with MinA PT Short Term Goal 2 (Week 1): Patient will perform sit/stand with LRAD and MinA consistently PT Short Term Goal 3 (Week 1): Patient will ambulate >50' with LRAD and MinA  Skilled Therapeutic Interventions/Progress Updates: Patient sitting in recliner on entrance to room. Patient alert and agreeable to PT session.   Patient reported no pain at beginning of PT session. Pt transported to day room gym for time management. Pt on 2L nasal cannula. PTA managed O2 tank throughout set at 2L.   Therapeutic Activity: Transfers: Pt performed sit<>stand pivot recliner<>WC with HHA for safety and cues to anteriorly weight shift (min/modA). Pt sit to stand from [facility] to RW with min/modA for ambulatory intervention.  Gait Training:  Pt ambulated 55' in day room gym using RW with WC follow for safety. Pt required a rest break and reported increase in fatigue. Pt with forward flexed posture and was provided with cues to extend through hips. Pt with decreased B step clearance, length and cadence. Pt required minA at first for safety, and then progressed to CGA.  Patient in recliner at end of session with brakes locked, belt alarm set, and all needs within reach.      Therapy Documentation Precautions:  Precautions Precautions: Fall Precaution Comments: 2L oxygen via [place]; HOH- No hearing aids Restrictions Weight Bearing Restrictions: No  Therapy/Group: Individual Therapy    PTA 06/05/2023, 3:18 PM

## 2023-06-06 MED ORDER — SENNA 8.6 MG PO TABS
1.0000 | ORAL_TABLET | Freq: Every day | ORAL | Status: DC
  Administered 2023-06-06 – 2023-06-10 (×5): 8.6 mg via ORAL
  Filled 2023-06-06 (×5): qty 1

## 2023-06-06 NOTE — Progress Notes (Signed)
PROGRESS NOTE   Subjective/Complaints: No new complaints this morning Would like to go home Says her daughter is coming today afternoon, discussed that if daughter can provide MinA we can send her home sooner  ROS: Patient denies fever, chills, rash, chest pain, shortness of breath, nausea, vomiting, diarrhea, abdominal pain, +urinary frequency, denies dizziness, lower extremity edema  Objective:   No results found. No results for input(s): "WBC", "HGB", "HCT", "PLT" in the last 72 hours.   No results for input(s): "NA", "K", "CL", "CO2", "GLUCOSE", "BUN", "CREATININE", "CALCIUM" in the last 72 hours.   Intake/Output Summary (Last 24 hours) at 06/06/2023 1910 Last data filed at 06/06/2023 1701 Gross per 24 hour  Intake 390 ml  Output 1050 ml  Net -660 ml        Physical Exam: Vital Signs Blood pressure 124/63, pulse 62, temperature 97.9 F (36.6 C), temperature source Oral, resp. rate 18, height 5\' 3"  (1.6 m), weight 71.9 kg, SpO2 99%.  General:  NAD, BMI 28.08 Gen: no distress, normal appearing HEENT: oral mucosa pink and moist, NCAT Cardio: Reg rate Chest: normal effort, normal rate of breathing Abd: soft, non-distended Ext: no edema Psych: pleasant, normal affect  Skin: Dry without signs of breakdown Neuro: Alert and awake, follows commands,  Normal language and speech. Cranial nerve exam unremarkable. A little HOH. MMT: UE grossly 4 to 4+/5 prox to distal. LE 3+ prox to 4+/5 ADF/PF.  Sensation intact light touch in all 4 extremities. DTR's 1+. No abnl resting tone.   Musculoskeletal: chronic DJD in hands, knees and feet. Minimal joint pain with ROM  Ambulating CG Assessment/Plan: 1. Functional deficits which require 3+ hours per day of interdisciplinary therapy in a comprehensive inpatient rehab setting. Physiatrist is providing close team supervision and 24 hour management of active medical problems listed  below. Physiatrist and rehab team continue to assess barriers to discharge/monitor patient progress toward functional and medical goals  Care Tool:  Bathing    Body parts bathed by patient: Right arm, Left arm, Chest, Abdomen, Front perineal area, Face, Right upper leg, Left upper leg, Right lower leg, Left lower leg   Body parts bathed by helper: Buttocks     Bathing assist Assist Level: Minimal Assistance - Patient > 75%     Upper Body Dressing/Undressing Upper body dressing   What is the patient wearing?: Pull over shirt    Upper body assist Assist Level: Supervision/Verbal cueing    Lower Body Dressing/Undressing Lower body dressing      What is the patient wearing?: Pants, Incontinence brief     Lower body assist Assist for lower body dressing: Contact Guard/Touching assist     Toileting Toileting    Toileting assist Assist for toileting: Contact Guard/Touching assist     Transfers Chair/bed transfer  Transfers assist     Chair/bed transfer assist level: Minimal Assistance - Patient > 75%     Locomotion Ambulation   Ambulation assist      Assist level: Minimal Assistance - Patient > 75% Assistive device: Walker-rolling Max distance: 29ft   Walk 10 feet activity   Assist     Assist level: Minimal Assistance - Patient >  75% Assistive device: Walker-rolling   Walk 50 feet activity   Assist Walk 50 feet with 2 turns activity did not occur: Safety/medical concerns (Patient unable to ambulate >17' at this time secondary to global deconditioning and weakness)  Assist level: Minimal Assistance - Patient > 75% Assistive device: Walker-rolling    Walk 150 feet activity   Assist Walk 150 feet activity did not occur: Safety/medical concerns         Walk 10 feet on uneven surface  activity   Assist     Assist level: Minimal Assistance - Patient > 75% Assistive device: Walker-rolling   Wheelchair     Assist Is the patient  using a wheelchair?: Yes Type of Wheelchair: Manual    Wheelchair assist level: Supervision/Verbal cueing Max wheelchair distance: 141ft    Wheelchair 50 feet with 2 turns activity    Assist        Assist Level: Supervision/Verbal cueing   Wheelchair 150 feet activity     Assist      Assist Level: Supervision/Verbal cueing   Blood pressure 124/63, pulse 62, temperature 97.9 F (36.6 C), temperature source Oral, resp. rate 18, height 5\' 3"  (1.6 m), weight 71.9 kg, SpO2 99%.   Medical Problem List and Plan: 1. Functional deficits secondary to debility secondary to acute on chronic hypoxic and hypercapnic respiratory failure/COPD exacerbation/CAP/acute diastolic heart failure/acute delirium..  Patient uses supplemental oxygen at baseline at home of 2 L.             -patient may shower             -ELOS/Goals: 14 days, mod I to supervision goals with PT and OT  -Continue CIR  IPOC completed  Discussed with therapy that she tolerated therapy very well today without symptoms, can continue therapy and have reached out to cardiology for HR parameters  Updated grandson  Sent message to team to discuss progress, family ed, and whether she can d/c home earlier, discussed with patient that if her children are able to provide MinA we can sent her home earlier 2.  Antithrombotics: -DVT/anticoagulation:  Mechanical: Antiembolism stockings, thigh (TED hose) Bilateral lower extremities             -antiplatelet therapy: N/A 3. Pain Management: Oxycodone 5 mg every 6 hours as needed moderate pain  -7/28 pain controlled, not using oxycodone frequently 4. Mood/Behavior/Sleep: Remeron 15 mg nightly             -pt reports that she continues to struggle with sleep. We discussed melatonin                         -will begin 3mg  melatonin scheduled at bedtime             -antipsychotic agents: N/A 5. Neuropsych/cognition: This patient is capable of making decisions on her own behalf. 6.  Skin/Wound Care: Routine skin checks 7. Fluids/Electrolytes/Nutrition: Routine and analysis with follow-up chemistries 8.  Hyperkalemia.  Resolved with aggressive treatments.  Her Lokelma has since been discontinued.  Patient had resumed her home oral dose of Lasix. -Recheck labs Monday 9.  Symptomatic chronic hyponatremia.  Nephrology follow-up.  Urine sodium 75 with 275 urine osmolality.  Plasma sodium normalizing -Follow-up chemistries, monitor intake -7/28 Recheck tomorrow 10.  Acute HFpEF exacerbation .  Imdur 30 mg daily,  Lasix 20 mg daily, Toprol XL 25 mg daily.  Monitor for any signs of fluid overload.  Daily weights  -7/28  Will asked nursing to check weights Filed Weights   06/03/23 0359 06/05/23 0620 06/06/23 0709  Weight: 71.1 kg 71 kg 71.9 kg    11.  Chronic orthostasis.  Florinef 0.1 mg daily.  Monitor with increased mobility 12.  MDS/CMML C/B neutropenia and thrombocytopenia.  Followed by hematology services Dr.Katragadda. 13.  Recent GI bleed.  Follow-up CBC on Monday - Protonix  daily for prophylaxis. 14.  Recent admission 6/4 - 6/7 for non-STEMI.  Followed by cardiology services Dr.Mallipeddi 15.  PAF.  Not a candidate for anticoagulation due to history of GI bleed.             -HR controlled    06/06/2023    3:17 PM 06/06/2023    8:08 AM 06/06/2023    7:09 AM  Vitals with BMI  Weight   158 lbs 8 oz  BMI   28.09  Systolic 124 145   Diastolic 63 80   Pulse 62 72     16.  Hypothyroidism.  Synthroid 17.  Hyperlipidemia.  Pravachol  18.  Urinary frequency.   -UA and reflex culture ordered, discussed that UA is negative  19. Hypotension: resolved, continue lasix 20mg , discussed with grand son dosing lasix based on standing weights. BP reviewed and is stable, maintain this dose  20. Hypokalemia: klor ordered x2 days this admission, repeat Monday  21. Afib with RVR: cardiology consulted, continue lopressor, discussed her symptoms with cardiology, discussed that  cardiology decided to maintain current metoprolol dose given her bradycardia, discussed that due to the afib she is at risk for hear rate spikes. Flowsheet reviewed and HR is currently stable, increase magnesium gluconate to 500mg  HS, continue  22. COPD: placed parameters for O2 to be titrated to goal of 88-92% or used prn for comfort, attempted wean to 1L but desatted to 86%  23. Overweight: provide dietary education. Magnesium supplement started    24. History of magnesium deficiency: continue magnesium gluconate to 500mg  HS  25. Constipation: increase magnesium gluconate to 500mg  HS, d/c oxycodone as nod needing, will add senna HS  LOS: 9 days A FACE TO FACE EVALUATION WAS PERFORMED   P  06/06/2023, 7:10 PM

## 2023-06-06 NOTE — Progress Notes (Signed)
Occupational Therapy Session Note  Patient Details  Name: Lisa [name] MRN: [id number] Date of Birth: 1934-05-02  {CHL IP REHAB OT TIME CALCULATIONS:[id number]}   Short Term Goals: Week 1:  OT Short Term Goal 1 (Week 1): patient will complete sit to stand using a RW for addiitonal balance at [employer] OT Short Term Goal 2 (Week 1): The pt will complete UB dressing for donning and doffing at [facility]  at 95% safe OT Short Term Goal 3 (Week 1): The pt will bathe UB/LB at [facility]  using A&E as needed at 95% safe after demonstration and initial instruction. OT Short Term Goal 4 (Week 1): The pt will complete LB dressing for donning and doffing at MinA  at 95% safe OT Short Term Goal 5 (Week 1): The pt will complete all stand pivot  transfers from one surface to another at [facility]  using the RW and grab bars at 95% safe.  Skilled Therapeutic Interventions/Progress Updates:    Patient agreeable to participate in OT session. Reports *** pain level.   Patient participated in skilled OT session focusing on ***. Therapist facilitated/assessed/developed/educated/integrated/elicited *** in order to improve/facilitate/promote    Therapy Documentation Precautions:  Precautions Precautions: Fall Precaution Comments: 2L oxygen via [place]; HOH- No hearing aids Restrictions Weight Bearing Restrictions: No   Therapy/Group: Individual Therapy  Limmie Patricia, OTR/L,CBIS  Supplemental OT - [facility] and [facility] Secure Chat Preferred   06/06/2023, 6:07 PM

## 2023-06-07 NOTE — Plan of Care (Signed)
?  Problem: Education: ?Goal: Knowledge of disease or condition will improve ?Outcome: Progressing ?Goal: Knowledge of the prescribed therapeutic regimen will improve ?Outcome: Progressing ?  ?Problem: Activity: ?Goal: Ability to tolerate increased activity will improve ?Outcome: Progressing ?  ?Problem: Respiratory: ?Goal: Levels of oxygenation will improve ?Outcome: Progressing ?  ?

## 2023-06-07 NOTE — Progress Notes (Signed)
PROGRESS NOTE   Subjective/Complaints:  Pt reports LBM Sunday- denies constipation.  Didn't have O2 on this AM- buried in bed.   Denies pain and SOB/DOE.  ROS:   Pt denies SOB, abd pain, CP, N/V/C/D, and vision changes   Objective:   No results found. No results for input(s): "WBC", "HGB", "HCT", "PLT" in the last 72 hours.   No results for input(s): "NA", "K", "CL", "CO2", "GLUCOSE", "BUN", "CREATININE", "CALCIUM" in the last 72 hours.   Intake/Output Summary (Last 24 hours) at 06/07/2023 0907 Last data filed at 06/07/2023 0759 Gross per 24 hour  Intake 390 ml  Output --  Net 390 ml        Physical Exam: Vital Signs Blood pressure (!) 120/59, pulse 73, temperature 98.1 F (36.7 C), temperature source Oral, resp. rate 18, height 5\' 3"  (1.6 m), weight 70.8 kg, SpO2 98%.  General:  NAD, BMI 28.08   General: awake, alert, appropriate, sitting up in bed; no O2 in place- was in bed; NAD HENT: conjugate gaze; oropharynx moist CV: regular rate;- has murmur- not sure if new? no JVD Pulmonary: decreased at bases B/L - no W/R/R this AM GI: soft, NT, ND, (+)BS Psychiatric: appropriate- interactive Neurological: alert, but didn't appear ot know how ot answer phone  Skin: Dry without signs of breakdown Neuro: Alert and awake, follows commands,  Normal language and speech. Cranial nerve exam unremarkable. A little HOH. MMT: UE grossly 4 to 4+/5 prox to distal. LE 3+ prox to 4+/5 ADF/PF.  Sensation intact light touch in all 4 extremities. DTR's 1+. No abnl resting tone.   Musculoskeletal: chronic DJD in hands, knees and feet. Minimal joint pain with ROM  Ambulating CG Assessment/Plan: 1. Functional deficits which require 3+ hours per day of interdisciplinary therapy in a comprehensive inpatient rehab setting. Physiatrist is providing close team supervision and 24 hour management of active medical problems listed  below. Physiatrist and rehab team continue to assess barriers to discharge/monitor patient progress toward functional and medical goals  Care Tool:  Bathing    Body parts bathed by patient: Right arm, Left arm, Chest, Abdomen, Front perineal area, Face, Right upper leg, Left upper leg, Right lower leg, Left lower leg   Body parts bathed by helper: Buttocks     Bathing assist Assist Level: Minimal Assistance - Patient > 75%     Upper Body Dressing/Undressing Upper body dressing   What is the patient wearing?: Pull over shirt    Upper body assist Assist Level: Supervision/Verbal cueing    Lower Body Dressing/Undressing Lower body dressing      What is the patient wearing?: Pants, Incontinence brief     Lower body assist Assist for lower body dressing: Contact Guard/Touching assist     Toileting Toileting    Toileting assist Assist for toileting: Contact Guard/Touching assist     Transfers Chair/bed transfer  Transfers assist     Chair/bed transfer assist level: Minimal Assistance - Patient > 75%     Locomotion Ambulation   Ambulation assist      Assist level: Minimal Assistance - Patient > 75% Assistive device: Walker-rolling Max distance: 27ft   Walk 10 feet  activity   Assist     Assist level: Minimal Assistance - Patient > 75% Assistive device: Walker-rolling   Walk 50 feet activity   Assist Walk 50 feet with 2 turns activity did not occur: Safety/medical concerns (Patient unable to ambulate >17' at this time secondary to global deconditioning and weakness)  Assist level: Minimal Assistance - Patient > 75% Assistive device: Walker-rolling    Walk 150 feet activity   Assist Walk 150 feet activity did not occur: Safety/medical concerns         Walk 10 feet on uneven surface  activity   Assist     Assist level: Minimal Assistance - Patient > 75% Assistive device: Walker-rolling   Wheelchair     Assist Is the patient  using a wheelchair?: Yes Type of Wheelchair: Manual    Wheelchair assist level: Supervision/Verbal cueing Max wheelchair distance: 121ft    Wheelchair 50 feet with 2 turns activity    Assist        Assist Level: Supervision/Verbal cueing   Wheelchair 150 feet activity     Assist      Assist Level: Supervision/Verbal cueing   Blood pressure (!) 120/59, pulse 73, temperature 98.1 F (36.7 C), temperature source Oral, resp. rate 18, height 5\' 3"  (1.6 m), weight 70.8 kg, SpO2 98%.   Medical Problem List and Plan: 1. Functional deficits secondary to debility secondary to acute on chronic hypoxic and hypercapnic respiratory failure/COPD exacerbation/CAP/acute diastolic heart failure/acute delirium..  Patient uses supplemental oxygen at baseline at home of 2 L.             -patient may shower             -ELOS/Goals: 14 days, mod I to supervision goals with PT and OT  -Continue CIR  IPOC completed  Discussed with therapy that she tolerated therapy very well today without symptoms, can continue therapy and have reached out to cardiology for HR parameters  Updated grandson  Sent message to team to discuss progress, family ed, and whether she can d/c home earlier, discussed with patient that if her children are able to provide MinA we can sent her home earlier  8/5- haven't heard about d/c date plans?  Con't CIR PT and OT- d/c 8/9 currently.  2.  Antithrombotics: -DVT/anticoagulation:  Mechanical: Antiembolism stockings, thigh (TED hose) Bilateral lower extremities             -antiplatelet therapy: N/A 3. Pain Management: Oxycodone 5 mg every 6 hours as needed moderate pain  -7/28 pain controlled, not using oxycodone frequently  8/5- Oxy has been stopped 4. Mood/Behavior/Sleep: Remeron 15 mg nightly             -pt reports that she continues to struggle with sleep. We discussed melatonin                         -will begin 3mg  melatonin scheduled at bedtime              -antipsychotic agents: N/A 5. Neuropsych/cognition: This patient is capable of making decisions on her own behalf. 6. Skin/Wound Care: Routine skin checks 7. Fluids/Electrolytes/Nutrition: Routine and analysis with follow-up chemistries 8.  Hyper/hypokalemia.  Resolved with aggressive treatments.  Her Lokelma has since been discontinued.  Patient had resumed her home oral dose of Lasix. -Recheck labs Monday  8/5- K+ 3.9- doing well 9.  Symptomatic chronic hyponatremia.  Nephrology follow-up.  Urine sodium 75 with 275 urine  osmolality.  Plasma sodium normalizing -Follow-up chemistries, monitor intake -7/28 Recheck tomorrow  8/5- Na 136 10.  Acute HFpEF exacerbation .  Imdur 30 mg daily,  Lasix 20 mg daily, Toprol XL 25 mg daily.  Monitor for any signs of fluid overload.  Daily weights  -7/28 Will asked nursing to check weights  8/5- Weight down 1 kg since started Lasix back Filed Weights   06/05/23 0620 06/06/23 0709 06/07/23 0721  Weight: 71 kg 71.9 kg 70.8 kg    11.  Chronic orthostasis.  Florinef 0.1 mg daily.  Monitor with increased mobility 12.  MDS/CMML C/B neutropenia and thrombocytopenia.  Followed by hematology services Dr.Katragadda. 13.  Recent GI bleed.  Follow-up CBC on Monday - Protonix  daily for prophylaxis. 8/5- Hb 8.5- on 7/29- hasn't been checked since 14.  Recent admission 6/4 - 6/7 for non-STEMI.  Followed by cardiology services Dr.Mallipeddi 15.  PAF.  Not a candidate for anticoagulation due to history of GI bleed.             -HR controlled    06/07/2023    8:22 AM 06/07/2023    7:21 AM 06/07/2023    3:38 AM  Vitals with BMI  Weight  156 lbs 1 oz   BMI  27.66   Systolic 120  154  Diastolic 59  98  Pulse 73  67    16.  Hypothyroidism.  Synthroid 17.  Hyperlipidemia.  Pravachol  18.  Urinary frequency.   -UA and reflex culture ordered, discussed that UA is negative  19. Hypotension: resolved, continue lasix 20mg , discussed with grand son dosing lasix based  on standing weights. BP reviewed and is stable, maintain this dose  20. Hypokalemia: klor ordered x2 days this admission, repeat Monday  21. Afib with RVR: cardiology consulted, continue lopressor, discussed her symptoms with cardiology, discussed that cardiology decided to maintain current metoprolol dose given her bradycardia, discussed that due to the afib she is at risk for hear rate spikes. Flowsheet reviewed and HR is currently stable, increase magnesium gluconate to 500mg  HS, continue  22. COPD: placed parameters for O2 to be titrated to goal of 88-92% or used prn for comfort, attempted wean to 1L but desatted to 86%  8/5- Wasn't wearing O2 this AM- put back on for pt.  23. Overweight: provide dietary education. Magnesium supplement started    24. History of magnesium deficiency: continue magnesium gluconate to 500mg  HS  25. Constipation: increase magnesium gluconate to 500mg  HS, d/c oxycodone as nod needing, will add senna HS   I spent a total of  36  minutes on total care today- >50% coordination of care- due to  [facility] through all labs and got to know pt for today- also called SW about possible earlier d/c- currently set for 8/9-   LOS: 10 days A FACE TO FACE EVALUATION WAS PERFORMED    06/07/2023, 9:07 AM

## 2023-06-07 NOTE — Progress Notes (Signed)
Patient ID: Lisa [name], female   DOB: [birth year], 89 y.o.   MRN: [id number]  Sw spoke with daughter, Steward Drone. Daughter shared she left education early on Friday due to not feeling well. Daughter expressed the air has went out in the home. Family would like to keep current d/c date if 8/9 to be able to address. Sw will call daughter tomorrow to rearrange family education for Wednesday or Thursday based on the schedule to have air repaired.

## 2023-06-07 NOTE — Progress Notes (Signed)
Occupational Therapy Session Note  Patient Details  Name: Lisa [name] MRN: [id number] Date of Birth: 1934-03-17  Today's Date: 06/07/2023 OT Individual Time: 1120-1200 & 1415-1525 OT Individual Time Calculation (min): 40 min & 70 min   Short Term Goals: Week 1:  OT Short Term Goal 1 (Week 1): patient will complete sit to stand using a RW for addiitonal balance at [employer] OT Short Term Goal 1 - Progress (Week 1): Met OT Short Term Goal 2 (Week 1): The pt will complete UB dressing for donning and doffing at MinA  at 95% safe OT Short Term Goal 2 - Progress (Week 1): Met OT Short Term Goal 3 (Week 1): The pt will bathe UB/LB at ModI  using A&E as needed at 95% safe after demonstration and initial instruction. OT Short Term Goal 3 - Progress (Week 1): Met OT Short Term Goal 4 (Week 1): The pt will complete LB dressing for donning and doffing at MinA  at 95% safe OT Short Term Goal 4 - Progress (Week 1): Met OT Short Term Goal 5 (Week 1): The pt will complete all stand pivot  transfers from one surface to another at [facility]  using the RW and grab bars at 95% safe. OT Short Term Goal 5 - Progress (Week 1): Met  Skilled Therapeutic Interventions/Progress Updates:  Session 1 Skilled OT intervention completed with focus on ambulatory transfers, global endurance, cognition in relation to IADL. Pt received seated in recliner, agreeable to session. No pain reported. Received on 2L O2 during session, with vitals WNL therefore remained on 2L at conclusion of session.  Pt declined self-care needs. Discussed with pt keeping the current DC date (as pt asking to leave sooner) to allow family education opportunity as family didn't attend scheduled session last week. Pt in agreement stating that her daughter has had AC issues and was unable to make last week.  CGA sit > stand using RW with min cues for anterior weight shifting, CGA ambulation about 15 ft to w/c with directional cues. Transported dependently in  w/c <> gym for time and energy conservation.   CGA sit > stand using RW at elevated table, then pt tolerated about 3 mins in standing with close supervision for balance during meal prep task with matching cards. Pt required min A for problem solving and task, however with increased fatigue and visible BLE shaking, pt unable to focus on task with pt requesting to complete it seated.  At the seated level, pt initially with supervision needed increasing to mod/max A for task with loud environment, and anticipate had increased challenge in this setting. Pt stated "sometimes my mind just goes blank."  Back in room, pt completed min A sit > stand with 1 minor posterior bias but able to recover with CGA, then ambulated to recliner with RW and CGA. Pt remained seated in recliner with BLE elevated, with chair alarm on/activated, and with all needs in reach at end of session.   Session 2 Skilled OT intervention completed with focus on toileting needs, ambulatory transfers, dynamic balance. Pt received seated in recliner, agreeable to session. Unrated pain reported in B knees in the middle of session; unsure of medication status therefore nurse notified but did not provide until end of session. OT offered rest breaks, repositioning and modifications throughout for pain reduction. Received on 2L O2 via , sat 90% or >, therefore remained on 2L for entirety and at conclusion of session.  Pt expressed the urge to use bathroom. Completed  CGA sit > stand using RW with cues for anterior weight shift, then ambulated with CGA to regular toilet. Able to doff LB clothing with cues only for sequencing. Continent of void only, with brief noted to be dirty from dry skin therefore pt requested to change. Doffed LB clothing for time, then rethreaded pull up and pants with min A. Pt required mod A to stand from regular commode due to lack of push up space that [facility] offered. Cues needed to use grab bar and RW. Donned clothing over  hips with min A for balance. Ambulated with CGA using RW to w/c at sink. Supervision for hand hygiene.  Transported dependently in w/c <> gym for energy conservation then CGA sit > stand and stand pivot to EOM with RW. Completed light min A sit > stand using RW, then placed clips on long mirror with CGA needed for balance, however pt needing seated rest break for fatigue before removing with same assist for sit > stand.   Min A sit > stand and CGA stand pivot to w/c with RW. Nurse present to provide meds, then min A sit > stand and CGA ambulatory transfer to EOB. Cues needed to doff jacket/shoes but no assist. Supervision for bed mobility to upright in bed. Pt remained upright in bed, with bed alarm on/activated, water provided per request, and with all needs in reach at end of session.    Therapy Documentation Precautions:  Precautions Precautions: Fall Precaution Comments: 2L oxygen via [place]; HOH- No hearing aids Restrictions Weight Bearing Restrictions: No    Therapy/Group: Individual Therapy  Melvyn Novas, MS, OTR/L  06/07/2023, 3:32 PM

## 2023-06-07 NOTE — Progress Notes (Signed)
Occupational Therapy Weekly Progress Note  Patient Details  Name: Lisa [name] MRN: [id number] Date of Birth: [birth year]  Beginning of progress report period: May 29, 2023 End of progress report period: June 07, 2023  Patient has met 5 of 5 short term goals. Pt is making steady progress towards LTGs. She is able to bathe at an overall min A level, dress with overall min A and requires min/mod A for toileting tasks. Pt continues to demonstrate significant endurance deficits requiring increased time and rest breaks during all functional ADLs and mobility. She demonstrates cognitive impairments including areas of problem solving, memory, awareness and delayed initiation as well as general dynamic balance and coordination deficits resulting in difficulty completing BADL tasks without increased physical assist. Pt will benefit from continued skilled OT services to focus on mentioned deficits. Family ed was arranged however family did not attend OT session.   Patient continues to demonstrate the following deficits: muscle weakness, decreased cardiorespiratoy endurance and decreased oxygen support, decreased coordination, decreased initiation, decreased awareness, decreased problem solving, decreased memory, and delayed processing, and decreased standing balance and therefore will continue to benefit from skilled OT intervention to enhance overall performance with BADL and Reduce care partner burden.  Patient progressing toward long term goals..  Continue plan of care.  OT Short Term Goals Week 1:  OT Short Term Goal 1 (Week 1): patient will complete sit to stand using a RW for addiitonal balance at [employer] OT Short Term Goal 1 - Progress (Week 1): Met OT Short Term Goal 2 (Week 1): The pt will complete UB dressing for donning and doffing at MinA  at 95% safe OT Short Term Goal 2 - Progress (Week 1): Met OT Short Term Goal 3 (Week 1): The pt will bathe UB/LB at ModI  using A&E as needed at 95% safe  after demonstration and initial instruction. OT Short Term Goal 3 - Progress (Week 1): Met OT Short Term Goal 4 (Week 1): The pt will complete LB dressing for donning and doffing at MinA  at 95% safe OT Short Term Goal 4 - Progress (Week 1): Met OT Short Term Goal 5 (Week 1): The pt will complete all stand pivot  transfers from one surface to another at [facility]  using the RW and grab bars at 95% safe. OT Short Term Goal 5 - Progress (Week 1): Met Week 2:  OT Short Term Goal 1 (Week 2): STG = LTG 2/2 ELOS    E Evertt Chouinard, MS, OTR/L  06/07/2023, 7:55 AM

## 2023-06-07 NOTE — Progress Notes (Signed)
Physical Therapy Weekly Progress Note  Patient Details  Name: Lisa [name] MRN: [id number] Date of Birth: April 24, 1934  Beginning of progress report period: May 29, 2023 End of progress report period: June 07, 2023  Today's Date: 06/07/2023 PT Individual Time: 5284-1324 PT Individual Time Calculation (min): 71 min   Patient has met 3 of 3 short term goals. Pt demonstrates gradual progress towards long term goals. Pt is currently able to perform bed mobility with supervision and use of bed features, sit<>stands with RW and CGA/min A, stand<>pivot transfers with RW and CGA, ambulate up to 181ft with RW and CGA/close supervision, propel WC 132ft using BUE and supervision, and navigate 4 6in steps with bilateral handrails and min A. Pt continues to be limited by fatigue and global weakness/deconditioning. Have attempted to titrate pt to 1L, however SPO2 drops into mid-low 80's and pt is unable to recover - currently on 2L. Pt will require hands on family education training prior to D/C.   Patient continues to demonstrate the following deficits muscle weakness, decreased cardiorespiratoy endurance and decreased oxygen support, and decreased standing balance, decreased postural control, and decreased balance strategies and therefore will continue to benefit from skilled PT intervention to increase functional independence with mobility.  Patient progressing toward long term goals..  Continue plan of care.  PT Short Term Goals Week 1:  PT Short Term Goal 1 (Week 1): Patient will perform bed mobility with MinA PT Short Term Goal 1 - Progress (Week 1): Met PT Short Term Goal 2 (Week 1): Patient will perform sit/stand with LRAD and MinA consistently PT Short Term Goal 2 - Progress (Week 1): Met PT Short Term Goal 3 (Week 1): Patient will ambulate >50' with LRAD and MinA PT Short Term Goal 3 - Progress (Week 1): Met Week 2:  PT Short Term Goal 1 (Week 2): STG=LTG due to LOS  Skilled Therapeutic  Interventions/Progress Updates:  Ambulation/gait training;DME/adaptive equipment instruction;Community reintegration;Psychosocial support;Neuromuscular re-education;Stair training;Wheelchair propulsion/positioning;UE/LE Strength taining/ROM;Balance/vestibular training;Discharge planning;Pain management;Functional electrical stimulation;Therapeutic Activities;UE/LE Coordination activities;Cognitive remediation/compensation;Disease management/prevention;Functional mobility training;Patient/family education;Splinting/orthotics;Therapeutic Exercise;Visual/perceptual remediation/compensation   Today's Interventions: Received pt semi-reclined in bed, pt agreeable to PT treatment, and denied any pain during session. Pt on 2L O2 with SPO2 >89% with activity. Session with emphasis on functional mobility/transfers, toileting, generalized strengthening and endurance, dynamic standing balance/coordination, simulated car transfers, and gait training. Pt transferred semi-reclined<>sitting R EOB with HOB elevated and use of bedrails with supervision. Pt donned socks without assist and shoes with max A. Pt reported urge to void and stood from elevated EOB with RW and heavy min A (cues for hand placement). Pt ambulated in/out of bathroom with RW and CGA and able to manage clothing standing with CGA. Pt continent of bladder and performed hygiene management seated without assist. Pt sat in WC at sink and washed hands and face with set up assist.   Pt transported to ortho gym in [facility] dependently for energy conservation purposes and performed seated BUE strengthening on UBE at level 1 for 3 minutes forward and 3 minutes backwards with 1 rest break halfway through. Decreased to 1L and pt stood from [facility] with RW and CGA and performed ambulatory simulated car transfer with RW and heavy mod A to stand from low sitting seat - SPO2 dropped to 85% and pt unable to recover; therefore increased back to 2L. Pt then ambulated 16ft on uneven  surfaces (ramp) with RW and CGA with total A to manage O2 tank. Stood from [facility] with RW and light  min A and ambulated 117ft with RW and CGA fading to close supervision with total A for equipment management - limited by fatigue.   Pt then performed WC mobility 197ft using BUE and supervision with increased time - emphasis on UE strength. Pt transported remainder of way back to room in [facility] dependently and requested to return to recliner. Stood from [facility] pushing with BUE support on WC armrests with CGA and ambulated 29ft with RW and close supervision to recliner. Concluded session with pt sitting in recliner, needs within reach, and seatbelt alarm on.   Therapy Documentation Precautions:  Precautions Precautions: Fall Precaution Comments: 2L oxygen via [place]; HOH- No hearing aids Restrictions Weight Bearing Restrictions: No  Therapy/Group: Individual Therapy [name] PT, DPT 06/07/2023, 6:55 AM

## 2023-06-08 NOTE — Progress Notes (Signed)
Occupational Therapy Session Note  Patient Details  Name: Lisa [name] MRN: [id number] Date of Birth: [birth year]  Today's Date: 06/08/2023 OT Individual Time: 1105-1200 & 2130-8657 OT Individual Time Calculation (min): 55 min & 40 min    Short Term Goals: Week 2:  OT Short Term Goal 1 (Week 2): STG = LTG 2/2 ELOS  Skilled Therapeutic Interventions/Progress Updates:  Session 1 Skilled OT intervention completed with focus on toileting needs, BUE endurance. Pt received seated in recliner asleep requiring increased stimuli to arouse, then agreeable to session. Unrated burning pain reported in BLE; nurse notified of pain med request. OT offered rest breaks, repositioning and modifications throughout for pain reduction.  Completed sit > stand with CGA using RW, then ambulated with CGA to [facility] over toilet. Continent of urinary void. Completed all toileting steps with supervision. Able to stand with supervision using BUE on arm rest of BSC. Ambulated with CGA using RW > w/c, set up A for hand hygiene seated. Nurse present for meds.  Transported dependently in w/c <> gym. Pt then participated in BUE endurance/strengthening needed for BADLs including the following exercises: (With yellow theraband) 2x10 reps Horizontal abduction Self-anchored shoulder flexion each arm Self-anchored bicep flexion each arm Shoulder external rotation -Some movements modified to AROM only due to RUE < LUE ROM, as well as mod cues needed for form throughout.  Back in room, pt completed supervision sit > stand using RW and ambulated with supervision about 25 ft to recliner in room. Pt remained seated in recliner, with belt alarm on/activated, water provided per request and with all needs in reach at end of session.  Vitals -Received on 2L via [place], SPO2 96% -93% after activity   Session 2 Skilled OT intervention completed with focus on family education with daughter present regarding functional mobility and ADLs. Pt  received seated in recliner, asleep but easily aroused, agreeable to session. No pain reported.  Pt initially confused and frustrated thinking that today was DC day; gentle orientation provided for day/time, and about that time daughter attended session and reoriented pt further.  Reviewed with daughter about pt's PLOF with report that she assisted pt with ADLs at a min A level, and was mainly supervision for mobility with RW but was really limited by weakness/endurance. OT reviewed pt goals of supervision/min A, with review of what is needed for showers including shower transfer and toileting steps if incontinent. Daughter receptive and reports planning to attend scheduled education tomorrow with therapies; planning to practice tub/shower transfer or potentially full shower to determine caregiver capabilities.  Pt completed sit > stand using RW with CGA; cues for anterior weight shifting. Ambulated about 120 ft with CGA/supervision using RW with w/c follow for fatigue. Extended rest break needed for O2 recover (see below) and general fatigue. Education provided to daughter on hands on assist via gait belt, how to manage O2 tank as pt has different personal device at home. Pt's daughter assisted pt with CGA sit > stand using RW then ambulated with pt 50 ft back to recliner with supervision. Did provide feedback for CGA for when pt is backing up to sitting surface incase of LOB.   Daughter expressed concern about direction of "weaning pt from O2" as she has chronically been on O2 for 2 years. Discussed with daughter that endurance building can increase O2 sat rate, however to verify with the PA/MD prior to DC as pt has been unable to titrate from 2L especially with mobility due to desaturation. Also reported  to nursing the daughter's concern of pt not receiving an inhaler daily while in rehab like at baseline.  Pt remained seated in recliner, with chair alarm on/activated, and with all needs in reach at end  of session.  Vitals -Received on 2L via [place], SPO2 93% -85% after activity on 2L, however rebounded to > 88% after 1-2 min recovery  Therapy Documentation Precautions:  Precautions Precautions: Fall Precaution Comments: 2L oxygen via [place]; HOH- No hearing aids Restrictions Weight Bearing Restrictions: No    Therapy/Group: Individual Therapy  Melvyn Novas, MS, OTR/L  06/08/2023, 3:53 PM

## 2023-06-08 NOTE — Plan of Care (Signed)
Patient slept in short naps this shift. Confused at one point, asking to go home to fix her air conditioning but easily reoriented. Patient without noted distress or complaints. Will continue to monitor.  Problem: Consults Goal: RH GENERAL PATIENT EDUCATION Description: See Patient Education module for education specifics. Outcome: Progressing   Problem: RH SAFETY Goal: RH STG ADHERE TO SAFETY PRECAUTIONS W/ASSISTANCE/DEVICE Description: STG Adhere to Safety Precautions With min Assistance/Device. Outcome: Progressing   Problem: RH PAIN MANAGEMENT Goal: RH STG PAIN MANAGED AT OR BELOW PT'S PAIN GOAL Description: Pain will be managed 3 out of 10 on pain scale with PRN medications min assist  Outcome: Progressing

## 2023-06-08 NOTE — Progress Notes (Signed)
PROGRESS NOTE   Subjective/Complaints:  Pt denies SOB/DOE, even though sats dropped to 91% after walking up stairs- came back up to 93-94% with rest- cannot go to 1L- drops to 86% on 1 L O2- asked PT to document these issues for SW to get O2 to go home with.     ROS:   Limited by cognition  Objective:   No results found. No results for input(s): "WBC", "HGB", "HCT", "PLT" in the last 72 hours.   No results for input(s): "NA", "K", "CL", "CO2", "GLUCOSE", "BUN", "CREATININE", "CALCIUM" in the last 72 hours.   Intake/Output Summary (Last 24 hours) at 06/08/2023 0820 Last data filed at 06/08/2023 0720 Gross per 24 hour  Intake 680 ml  Output --  Net 680 ml        Physical Exam: Vital Signs Blood pressure (!) 145/97, pulse 71, temperature 98.7 F (37.1 C), resp. rate 17, height 5\' 3"  (1.6 m), weight 70.8 kg, SpO2 95%.    General: awake, alert, sitting up in w/c in gym- very angry about d/c date not being today; with PT;  NAD HENT: conjugate gaze; oropharynx dry CV: regular rate; no JVD Pulmonary: decreased at bases; no W/R/R- on 2L O2-  GI: soft, NT, ND, (+)BS Psychiatric: inappropriate, angry, yelling at [place] about needing to go home today Neurological: somewhat confused- and angry- "needs to go home"  Skin: Dry without signs of breakdown Neuro: Alert and awake, follows commands,  Normal language and speech. Cranial nerve exam unremarkable. A little HOH. MMT: UE grossly 4 to 4+/5 prox to distal. LE 3+ prox to 4+/5 ADF/PF.  Sensation intact light touch in all 4 extremities. DTR's 1+. No abnl resting tone.   Musculoskeletal: chronic DJD in hands, knees and feet. Minimal joint pain with ROM  Ambulating CG Assessment/Plan: 1. Functional deficits which require 3+ hours per day of interdisciplinary therapy in a comprehensive inpatient rehab setting. Physiatrist is providing close team supervision and 24 hour management of  active medical problems listed below. Physiatrist and rehab team continue to assess barriers to discharge/monitor patient progress toward functional and medical goals  Care Tool:  Bathing    Body parts bathed by patient: Right arm, Left arm, Chest, Abdomen, Front perineal area, Face, Right upper leg, Left upper leg, Right lower leg, Left lower leg   Body parts bathed by helper: Buttocks     Bathing assist Assist Level: Minimal Assistance - Patient > 75%     Upper Body Dressing/Undressing Upper body dressing   What is the patient wearing?: Pull over shirt    Upper body assist Assist Level: Supervision/Verbal cueing    Lower Body Dressing/Undressing Lower body dressing      What is the patient wearing?: Pants, Incontinence brief     Lower body assist Assist for lower body dressing: Contact Guard/Touching assist     Toileting Toileting    Toileting assist Assist for toileting: Contact Guard/Touching assist     Transfers Chair/bed transfer  Transfers assist     Chair/bed transfer assist level: Contact Guard/Touching assist     Locomotion Ambulation   Ambulation assist      Assist level: Contact Guard/Touching assist  Assistive device: Walker-rolling Max distance: 162ft   Walk 10 feet activity   Assist     Assist level: Contact Guard/Touching assist Assistive device: Walker-rolling   Walk 50 feet activity   Assist Walk 50 feet with 2 turns activity did not occur: Safety/medical concerns (Patient unable to ambulate >17' at this time secondary to global deconditioning and weakness)  Assist level: Contact Guard/Touching assist Assistive device: Walker-rolling    Walk 150 feet activity   Assist Walk 150 feet activity did not occur: Safety/medical concerns         Walk 10 feet on uneven surface  activity   Assist     Assist level: Contact Guard/Touching assist Assistive device: Walker-rolling   Wheelchair     Assist Is the  patient using a wheelchair?: Yes Type of Wheelchair: Manual    Wheelchair assist level: Supervision/Verbal cueing Max wheelchair distance: 157ft    Wheelchair 50 feet with 2 turns activity    Assist        Assist Level: Supervision/Verbal cueing   Wheelchair 150 feet activity     Assist      Assist Level: Supervision/Verbal cueing   Blood pressure (!) 145/97, pulse 71, temperature 98.7 F (37.1 C), resp. rate 17, height 5\' 3"  (1.6 m), weight 70.8 kg, SpO2 95%.   Medical Problem List and Plan: 1. Functional deficits secondary to debility secondary to acute on chronic hypoxic and hypercapnic respiratory failure/COPD exacerbation/CAP/acute diastolic heart failure/acute delirium..  Patient uses supplemental oxygen at baseline at home of 2 L.             -patient may shower             -ELOS/Goals: 14 days, mod I to supervision goals with PT and OT  -Continue CIR  IPOC completed  Discussed with therapy that she tolerated therapy very well today without symptoms, can continue therapy and have reached out to cardiology for HR parameters  Updated grandson  Sent message to team to discuss progress, family ed, and whether she can d/c home earlier, discussed with patient that if her children are able to provide MinA we can sent her home earlier  Con't CIR PT and OT  Pt d/c 8/9- very angry about this, but is still min A and per SW, pt cannot go home til supervision, if possible-  2.  Antithrombotics: -DVT/anticoagulation:  Mechanical: Antiembolism stockings, thigh (TED hose) Bilateral lower extremities             -antiplatelet therapy: N/A 3. Pain Management: Oxycodone 5 mg every 6 hours as needed moderate pain  -7/28 pain controlled, not using oxycodone frequently  8/5- Oxy has been stopped 4. Mood/Behavior/Sleep: Remeron 15 mg nightly             -pt reports that she continues to struggle with sleep. We discussed melatonin                         -will begin 3mg  melatonin  scheduled at bedtime             -antipsychotic agents: N/A 5. Neuropsych/cognition: This patient is capable of making decisions on her own behalf. 6. Skin/Wound Care: Routine skin checks 7. Fluids/Electrolytes/Nutrition: Routine and analysis with follow-up chemistries 8.  Hyper/hypokalemia.  Resolved with aggressive treatments.  Her Lokelma has since been discontinued.  Patient had resumed her home oral dose of Lasix. -Recheck labs Monday  8/5- K+ 3.9- doing well 9.  Symptomatic  chronic hyponatremia.  Nephrology follow-up.  Urine sodium 75 with 275 urine osmolality.  Plasma sodium normalizing -Follow-up chemistries, monitor intake -7/28 Recheck tomorrow  8/5- Na 136 10.  Acute HFpEF exacerbation .  Imdur 30 mg daily,  Lasix 20 mg daily, Toprol XL 25 mg daily.  Monitor for any signs of fluid overload.  Daily weights  -7/28 Will asked nursing to check weights  8/5- Weight down 1 kg since started  Lasix back  8/6- weight stable at 70.8 kg Filed Weights   06/06/23 0709 06/07/23 0721 06/08/23 0500  Weight: 71.9 kg 70.8 kg 70.8 kg    11.  Chronic orthostasis.  Florinef 0.1 mg daily.  Monitor with increased mobility 12.  MDS/CMML C/B neutropenia and thrombocytopenia.  Followed by hematology services Dr.Katragadda. 13.  Recent GI bleed.  Follow-up CBC on Monday - Protonix  daily for prophylaxis. 8/5- Hb 8.5- on 7/29- hasn't been checked since 14.  Recent admission 6/4 - 6/7 for non-STEMI.  Followed by cardiology services Dr.Mallipeddi 15.  PAF.  Not a candidate for anticoagulation due to history of GI bleed.             -8/6- BP slightly elevated this AM, but usually controlled- con't regimen    06/08/2023    5:00 AM 06/08/2023    4:41 AM 06/07/2023    8:45 PM  Vitals with BMI  Weight 156 lbs 1 oz    BMI 27.66    Systolic  145 151  Diastolic  97 80  Pulse  71 63    16.  Hypothyroidism.  Synthroid 17.  Hyperlipidemia.  Pravachol  18.  Urinary frequency.   -UA and reflex culture  ordered, discussed that UA is negative  19. Hypotension: resolved, continue lasix 20mg , discussed with grand son dosing lasix based on standing weights. BP reviewed and is stable, maintain this dose  20. Hypokalemia: klor ordered x2 days this admission, repeat Monday  21. Afib with RVR: cardiology consulted, continue lopressor, discussed her symptoms with cardiology, discussed that cardiology decided to maintain current metoprolol dose given her bradycardia, discussed that due to the afib she is at risk for hear rate spikes. Flowsheet reviewed and HR is currently stable, increase magnesium gluconate to 500mg  HS, continue  22. COPD: placed parameters for O2 to be titrated to goal of 88-92% or used prn for comfort, attempted wean to 1L but desatted to 86%  8/5- Wasn't wearing O2 this AM- put back on for pt.   8/6- pt needs O2- when drops from 2L to 1L, drops to 86%on 1L- asked PT to do documentation for need of O2.  23. Overweight: provide dietary education. Magnesium supplement started    24. History of magnesium deficiency: continue magnesium gluconate to 500mg  HS  25. Constipation: increase magnesium gluconate to 500mg  HS, d/c oxycodone as nod needing, will add senna HS  8/6- LBM yesterday- large- mushy  I spent a total of  39  minutes on total care today- >50% coordination of care- due to  D/w PT about pt's drop in O2 sat-s need for O2 to go home with- and discussing with pt-who's confused- why she's staying til Friday.   LOS: 11 days A FACE TO FACE EVALUATION WAS PERFORMED    06/08/2023, 8:20 AM

## 2023-06-08 NOTE — Progress Notes (Signed)
Patient ID: Lisa [name], female   DOB: 03/18/1934, 89 y.o.   MRN: [id number]  Patient approved with [facility].

## 2023-06-08 NOTE — Discharge Summary (Signed)
Physician Discharge Summary  Patient ID: Lisa [name] MRN: [id number] DOB/AGE: Aug 28, 1934 89 y.o.  Admit date: 05/28/2023 Discharge date: 06/11/2023  Discharge Diagnoses:  Principal Problem:   Debility Active problems: Debility secondary to acute on chronic hypoxic and hypercapnic respiratory failure/COPD exacerbation/CAP/acute diastolic heart failure/acute delirium  Hyperkalemia Insomnia Symptomatic chronic hyponatremia  Acute HFpEF exacerbation  Chronic orthostasis  MDS/CMML C/B neutropenia and thrombocytopenia  Recent GI bleed  PAF Hypothyroidism Hyperlipidemia Constipation  Atrial fibrillation with intermittent tachycardia  Discharged Condition: stable  Significant Diagnostic Studies: Narrative & Impression CLINICAL DATA:  Phlegm in throat.  Feeling drowsy.   EXAM: CHEST - 2 VIEW   COMPARISON:  X-ray 06/02/2023   FINDINGS: Enlarged cardiopericardial silhouette with vascular congestion. Trace interstitial edema. Kyphotic x-ray obscures the left lung apex. No obvious pneumothorax. Surgical clips along the left hemithorax. Elevated left hemidiaphragm.   IMPRESSION: Enlarged heart with vascular congestion and trace edema.   Rotated kyphotic x-ray obscures the left lung apex on the frontal view.     Electronically Signed   By: Karen Kays M.D.   On: 06/09/2023 17:35     DG Chest 2 View  Result Date: 06/02/2023 CLINICAL DATA:  [id number] Phlegm in throat [id number] EXAM: CHEST - 2 VIEW COMPARISON:  05/23/2023 FINDINGS: Previously noted multifocal pulmonary infiltrates have significantly improved with mild residual bibasilar atelectasis or infiltrate. There is central pulmonary vascular congestion without overt pulmonary edema again noted. No pneumothorax or pleural effusion. Cardiac size within normal limits. No acute bone abnormality. Surgical clips noted within the left axilla. IMPRESSION: 1. Significant interval improvement in multifocal pulmonary infiltrates with  mild residual bibasilar atelectasis or infiltrate. Electronically Signed   By: Helyn Numbers M.D.   On: 06/02/2023 17:56       Labs:  Basic Metabolic Panel: No results for input(s): "NA", "K", "CL", "CO2", "GLUCOSE", "BUN", "CREATININE", "CALCIUM", "MG", "PHOS" in the last 168 hours.   CBC:    Latest Ref Rng & Units 06/09/2023    4:06 PM 05/31/2023    7:35 AM 05/28/2023    4:21 AM  CBC  WBC 4.0 - 10.5 K/uL 2.8  5.4  6.5   Hemoglobin 12.0 - 15.0 g/dL 8.2  8.5  7.9   Hematocrit 36.0 - 46.0 % 30.0  29.7  28.4   Platelets 150 - 400 K/uL 69  65  79     Brief HPI:   Lisa [name] is a 89 y.o. female with recent admission 6/4 - 6/7 for non-STEMI course complicated by acute blood loss anemia discharged with PPI twice daily per GI and hyperkalemia discharged with every other day Lokelma. Per chart review patient lives with her daughter. Two-level home but patient stays on main level with 4 steps to entry of home. Her grandson is POA-Dr. Dorann Lodge. Presented to [facility] 05/20/2023 with generalized weakness x 2 days as well as endorsing decreased appetite, increased confusion and ultimately found to have increased O2 requirements after family checked oxygen saturations in the 70s. She did have a witnessed fall at home prior to admission denying that she struck her head. On presentation advance 6 L to maintain oxygen saturations greater than 90% ABG showed 7.2 3/65/46/26. Her potassium was elevated 6.5 without EKG changes given calcium gluconate, Lokelma 5 g and 2 x 1L LR boluses. Chest x-ray showed moderate perihilar and lower lobe interstitial pulmonary edema. BNP elevated at 1095 with normal lactate. Also found to have stable and chronic neutropenia with ANC 200, and stable on  chronic thrombocytopenia platelets 101,000. She did have a low-grade fever of 101.4. Cranial CT scan showed no acute intracranial changes. Over the course of that afternoon and night of 7/18 consulted PCCM for BiPAP.  Continued to have management of potassium with Lasix and Lokelma. Ceftriaxone azithromycin initiated 7/19 after initially being placed on doxycycline. Antibiotic course has since been completed. Patient required intravenous methylprednisolone course completed started on oral prednisone 30 mg daily. Patient with chronic hyponatremia with a goal sodium of 128-130. Urine sodium 75 with 275 urine osmolality not appearing as SIADH, nor adrenal insufficiency giving morning cortisol within normal limits and latest sodium level of 136. Patient had brief chest pain episode 7/22 workup showed peak at bedtime troponin 318, 297, 299 cardiology follow-up plan medical management. Echocardiogram with ejection fraction of 60 to 65% no wall motion abnormalities. Patient not a candidate for anticoagulation due to high bleeding risk. Palliative care was consulted to establish goals of care.    Hospital Course: Lisa [name] was admitted to rehab 05/28/2023 for inpatient therapies to consist of PT, ST and OT at least three hours five days a week. Past admission physiatrist, therapy team and rehab RN have worked together to provide customized collaborative inpatient rehab. Follow-up labs stable. Complained of urinary frequency on 7/28 and UA performed. Lasix decreased to 10 mg daily. Remeron continued and melatonin added for poor sleep. UA negative. Lasix back up to 20 mg. Follow-up BMP with decreased serum potassium and supplemented with 40 mEq. Hgb improved to 8.5 on 7/29.Found to be in a fib with RVR and cardiology consulted on 7/30. Producing green phlegm, CXR ordered, denies shortness of breath, lower extremity edema, discussed cardiology recommendations on 7/31.  COPD: placed parameters for O2 to be titrated to goal of 88-92% or used prn for comfort. Magnesium gluconate 250 mg q HS for history of deficiency as well as constipation. Serum sodium 136 on 8/05 and potassium 3.9. Hemoglobin stable at 8.2 on 8/07. Productive cough  and chest x-ray obtained 8/07>> + vascular congestion. Given additional Lasix 10 mg. She will continue prednisone 10 mg daily for AECOPD at discharge. Discussed with her daughter to call PCP and Dr. Vassie Loll for further follow-up instructions/recommendations. RLE knee sleeve delivered to room at time of discharge.   Blood pressures were monitored on TID basis and Imdur 30 mg daily, Lasix 20 mg daily, Toprol XL 25 mg daily continued. Florinef for chronic orthostasis continued. Lasix decreased to 10 mg daily on 7/29. Increased back to 20 mg on 7/30 due to elevated BP.  Florinef discontinued at discharge due to elevated BP.   Rehab course: During patient's stay in rehab weekly team conferences were held to monitor patient's progress, set goals and discuss barriers to discharge. At admission, patient required min-mod with basic self-care skills,  mod with mobility   She  has had improvement in activity tolerance, balance, postural control as well as ability to compensate for deficits. She has had improvement in functional use RUE/LUE  and RLE/LLE as well as improvement in awareness. Patient has met 9 of 9 long term goals due to improved activity tolerance, improved balance, improved postural control, increased strength, ability to compensate for deficits, improved awareness, and improved coordination. Patient to discharge at an ambulatory level supervision with RW. Patient's care partner is independent to provide the necessary physical and cognitive assistance at discharge. Pt's daughter attended family education training on 8/7 and verbalized confidence with all tasks to ensure safe discharge home. However, therapist did have to  provide frequent cues for safety with transfer technique using RW.    Home health PT/OT/SLP arranged.   Discharge disposition: 06-Home-Health Care Svc     Diet: Heart healthy  Special Instructions: No driving, alcohol consumption or tobacco use.  Recommend daily BP measurement in  same arm and record time of day. Bring this information with you to follow-up appointment with PCP.  30-35 minutes were spent on discharge planning and discharge summary.  Discharge Instructions     Ambulatory referral to Physical Medicine Rehab   Complete by: As directed    Hospital follow-up   Discharge patient   Complete by: As directed    Discharge disposition: 06-Home-Health Care Svc   Discharge patient date: 06/11/2023      Allergies as of 06/11/2023       Reactions   Meloxicam Other (See Comments)   Caused an injury to the kidneys, per nephrologist   Micardis Hct [telmisartan-hctz] Other (See Comments)   Caused an injury to the kidneys, per nephrologist        Medication List     STOP taking these medications    fludrocortisone 0.1 MG tablet Commonly known as: FLORINEF   gabapentin 100 MG capsule Commonly known as: Neurontin       TAKE these medications    acetaminophen 325 MG tablet Commonly known as: TYLENOL Take 1 tablet (325 mg total) by mouth every 6 (six) hours as needed for mild pain (or Fever >/= 101). What changed:  how much to take when to take this reasons to take this   albuterol 108 (90 Base) MCG/ACT inhaler Commonly known as: VENTOLIN HFA Inhale 1-2 puffs into the lungs every 4 (four) hours as needed for wheezing or shortness of breath.   furosemide 20 MG tablet Commonly known as: LASIX Take 1 tablet (20 mg total) by mouth daily.   isosorbide mononitrate 60 MG 24 hr tablet Commonly known as: IMDUR Take 1 tablet (60 mg total) by mouth daily. Start taking on: June 12, 2023 What changed:  medication strength how much to take   levothyroxine 100 MCG tablet Commonly known as: SYNTHROID Take 1 tablet (100 mcg total) by mouth daily before breakfast.   magnesium oxide 400 MG tablet Commonly known as: MAG-OX Take 0.5 tablets (200 mg total) by mouth at bedtime. What changed:  how much to take when to take this   melatonin 3 MG  Tabs tablet Take 1 tablet (3 mg total) by mouth at bedtime.   metoprolol succinate 25 MG 24 hr tablet Commonly known as: TOPROL-XL TAKE 1 TABLET DAILY   mirtazapine 15 MG tablet Commonly known as: REMERON Take 1 tablet (15 mg total) by mouth at bedtime. What changed:  medication strength how much to take   Multi-Vitamin tablet Take 1 tablet by mouth daily.   pantoprazole 40 MG tablet Commonly known as: PROTONIX Take 1 tablet (40 mg total) by mouth 2 (two) times daily.   pravastatin 10 MG tablet Commonly known as: PRAVACHOL Take 1 tablet (10 mg total) by mouth every evening.   predniSONE 10 MG tablet Commonly known as: DELTASONE Take 1 tablet (10 mg total) by mouth daily with breakfast.   psyllium 95 % Pack Commonly known as: HYDROCIL/METAMUCIL Take 1 packet by mouth daily.   senna 8.6 MG Tabs tablet Commonly known as: SENOKOT Take 1 tablet (8.6 mg total) by mouth at bedtime.   Trelegy Ellipta 100-62.5-25 MCG/ACT Aepb Generic drug: Fluticasone-Umeclidin-Vilant Inhale 1 puff into the lungs daily.  Follow-up Information     Raulkar, Drema Pry, MD Follow up.   Specialty: Physical Medicine and Rehabilitation Why: office will call you to arrange your appt (sent) Contact information: [address] [place] [address] [phone]         Doreatha Massed, MD Follow up.   Specialty: Hematology Why: Call office in 1-2 days to make arrangements for hospital follow-up appointment Contact information: [address] [place] [address] [phone]         Marjo Bicker, MD Follow up.   Specialties: Cardiology, Internal Medicine Why: Call office in 1-2 days to make arrangements for hospital follow-up appointment Contact information: [address] [place] [address] [phone]         Terrial Rhodes, MD Follow up.   Specialty: Nephrology Why: Call office in 1-2 days to make arrangements for hospital follow-up  appointment Contact information: [address] [place] [address] [phone]         Benancio Deeds, MD Follow up.   Specialty: Gastroenterology Why: Call office in 1-2 days to make arrangements for hospital follow-up appointment Contact information: [address] [place] [address] [phone]         [name], MD Follow up.   Specialty: Internal Medicine Why: Call office in 1-2 days to make arrangements for hospital follow-up appointment Contact information: [address] [place] [facility] [address] [phone]         Oretha Milch, MD Follow up.   Specialty: Pulmonary Disease Why: Call office in 1-2 days to make arrangements for hospital follow-up appointment Contact information: [address] [place] [address] [phone]                 Signed: Milinda Antis 06/11/2023, 3:54 PM

## 2023-06-08 NOTE — Progress Notes (Signed)
Patient ID: Lisa [name], female   DOB: 1934-10-11, 89 y.o.   MRN: [id number]  Daughter will be present today to attend some family edu. Family education arranged tomorrow 9a-12p.

## 2023-06-08 NOTE — Progress Notes (Signed)
Physical Therapy Session Note  Patient Details  Name: Lisa [name] MRN: [id number] Date of Birth: [birth year]  Today's Date: 06/08/2023 PT Individual Time: 0732-0827 and 1015-1040 PT Individual Time Calculation (min): 55 min and 25 min  Short Term Goals: Week 1:  PT Short Term Goal 1 (Week 1): Patient will perform bed mobility with MinA PT Short Term Goal 1 - Progress (Week 1): Met PT Short Term Goal 2 (Week 1): Patient will perform sit/stand with LRAD and MinA consistently PT Short Term Goal 2 - Progress (Week 1): Met PT Short Term Goal 3 (Week 1): Patient will ambulate >50' with LRAD and MinA PT Short Term Goal 3 - Progress (Week 1): Met Week 2:  PT Short Term Goal 1 (Week 2): STG=LTG due to LOS  Skilled Therapeutic Interventions/Progress Updates:   Treatment Session 1 Received pt long sitting in bed, pt agreeable to PT treatment, and denied any pain during session. Pt on 2L with SPO2 90-97% throughout session. Session with emphasis on functional mobility/transfers, toileting, generalized strengthening and endurance, dynamic standing balance/coordination, gait training, and stair navigation. Pt transferred long sitting<>sitting L EOB with HOB elevated and supervision. Donned shoes with max A for time management purposes and pt stood from elevated EOB with RW and min A and ambulated in/out of bathroom with RW and CGA/close supervision. Pt able to manage clothing without assist, void, and perform hygiene management independently. Sat in WC at sink and washed hands/face with set up assist.   Pt stood from [facility] with RW and min A and ambulated 110ft with RW and CGA fading to close supervision to main therapy gym. Pt verbalized feeling "tired" after ambulating and required extensive seated rest break to recover. Stood from [facility] with RW and min A and ambulated 6ft x 2 trials with RW and close supervision to/from staircase. Took seated rest break, then navigated 4 6in steps with bilateral handrails and  CGA/close supervision ascending and descending with a step to pattern. SPO2 dropped to 90% afterwards but increased to 96% with seated rest break. MD arrived for morning rounds and pt became confused and angry thinking she was going home today vs Friday - gentle reorientation provided. Pt transported back to room in [facility] dependently and therapist assisted pt with calling her daughter. Concluded session with pt sitting in recliner, needs within reach, and seatbelt alarm on.   Treatment Session 2 Received pt sitting in WC, pt agreeable to PT treatment, and denied any pain during session. Pt on 2L with SPO2 91-98% throughout session. Session with emphasis on functional mobility/transfers, generalized strengthening and endurance, and gait training. Pt stood from [facility] with RW and min A and ambulated 160ft x 2 trials with RW and close supervision to/from dayroom with assist to manage O2 tank. Took extended seated rest break then stood from EOM with RW and min A (cues for hand placement) ambulated back to room. Concluded session with pt sitting in recliner, needs within reach, and chair pad alarm on.   Therapy Documentation Precautions:  Precautions Precautions: Fall Precaution Comments: 2L oxygen via [place]; HOH- No hearing aids Restrictions Weight Bearing Restrictions: No  Therapy/Group: Individual Therapy [name] PT, DPT 06/08/2023, 7:04 AM

## 2023-06-09 ENCOUNTER — Inpatient Hospital Stay (HOSPITAL_COMMUNITY): Payer: Medicare Other

## 2023-06-09 LAB — CBC
HCT: 30 % — ABNORMAL LOW (ref 36.0–46.0)
Hemoglobin: 8.2 g/dL — ABNORMAL LOW (ref 12.0–15.0)
MCH: 29.3 pg (ref 26.0–34.0)
MCHC: 27.3 g/dL — ABNORMAL LOW (ref 30.0–36.0)
MCV: 107.1 fL — ABNORMAL HIGH (ref 80.0–100.0)
Platelets: 69 10*3/uL — ABNORMAL LOW (ref 150–400)
RBC: 2.8 MIL/uL — ABNORMAL LOW (ref 3.87–5.11)
RDW: 23.2 % — ABNORMAL HIGH (ref 11.5–15.5)
WBC: 2.8 10*3/uL — ABNORMAL LOW (ref 4.0–10.5)
nRBC: 31.7 % — ABNORMAL HIGH (ref 0.0–0.2)

## 2023-06-09 MED ORDER — PSYLLIUM 95 % PO PACK
1.0000 | PACK | Freq: Every day | ORAL | Status: DC
  Administered 2023-06-09 – 2023-06-11 (×3): 1 via ORAL
  Filled 2023-06-09 (×3): qty 1

## 2023-06-09 MED ORDER — PREDNISONE 20 MG PO TABS
20.0000 mg | ORAL_TABLET | Freq: Every day | ORAL | Status: DC
  Administered 2023-06-10 – 2023-06-11 (×2): 20 mg via ORAL
  Filled 2023-06-09 (×2): qty 1

## 2023-06-09 MED ORDER — FLUDROCORTISONE ACETATE 0.1 MG PO TABS
0.0500 mg | ORAL_TABLET | Freq: Every day | ORAL | Status: DC
  Administered 2023-06-10 – 2023-06-11 (×2): 0.05 mg via ORAL
  Filled 2023-06-09 (×2): qty 1

## 2023-06-09 NOTE — Progress Notes (Signed)
Occupational Therapy Discharge Summary  Patient Details  Name: Lisa [name] MRN: [id number] Date of Birth: 05-31-1934  Date of Discharge from OT service:June 10, 2023   Patient has met 7 of 8 long term goals due to improved activity tolerance, improved balance, and improved coordination.  Patient to discharge at overall Supervision to min A level.  Patient's care partner is independent to provide the necessary physical and cognitive assistance at discharge.    Reasons goals not met: Dynamic standing balance goal not met at mod I, due to supervision needed for endurance, balance, coordination and cognitive deficits  Recommendation:  Patient will benefit from ongoing skilled OT services in home health setting to continue to advance functional skills in the area of BADL and Reduce care partner burden.  Equipment: No equipment provided  Reasons for discharge: treatment goals met and discharge from hospital  Patient/family agrees with progress made and goals achieved: Yes  OT Discharge Precautions/Restrictions  Precautions Precautions: Fall Precaution Comments: 2L oxygen via [place]; HOH- No hearing aids Restrictions Weight Bearing Restrictions: No ADL ADL Eating: Set up Where Assessed-Eating: Wheelchair Grooming: Setup Where Assessed-Grooming: Sitting at sink Upper Body Bathing: Setup Where Assessed-Upper Body Bathing: Shower Lower Body Bathing: Supervision/safety Where Assessed-Lower Body Bathing: Shower Upper Body Dressing: Setup Where Assessed-Upper Body Dressing: Sitting at sink Lower Body Dressing: Supervision/safety Where Assessed-Lower Body Dressing: Sitting at sink, Standing at sink Toileting: Supervision/safety Where Assessed-Toileting: Bedside Commode, Toilet Toilet Transfer: Close supervision Toilet Transfer Method: Proofreader: Grab bars, Raised toilet seat, Other (comment) (RW) Tub/Shower Transfer: Minimal assistance Tub/Shower Transfer  Method: Ship broker: Insurance underwriter: Administrator, arts Method: Warden/ranger: Emergency planning/management officer, Grab bars Vision Baseline Vision/History: 1 Wears glasses;4 Cataracts Patient Visual Report: No change from baseline Vision Assessment?: No apparent visual deficits Perception  Perception: Within Functional Limits Praxis Praxis: WFL Cognition Brief Interview for Mental Status (BIMS) Repetition of Three Words (First Attempt): 3 Temporal Orientation: Year: Correct Temporal Orientation: Month: Missed by 6 days to 1 month Temporal Orientation: Day: Correct Recall: "Sock": Yes, no cue required Recall: "Blue": Yes, after cueing ("a color") Recall: "Bed": Yes, no cue required BIMS Summary Score: 13 Sensation Sensation Light Touch: Impaired by gross assessment Hot/Cold: Appears Intact Proprioception: Appears Intact Stereognosis: Not tested Additional Comments: BLE numbness/tingling Coordination Gross Motor Movements are Fluid and Coordinated: No Fine Motor Movements are Fluid and Coordinated: No Coordination and Movement Description: generalized debility limiting global endurance Finger Nose Finger Test: dysmetria bilaterally Motor  Motor Motor: Other (comment) Motor - Skilled Clinical Observations: global weakness/deconditioning and impaired sequencing with more complex tasks Mobility  Bed Mobility Bed Mobility: Rolling Right;Rolling Left;Sit to Supine;Supine to Sit Rolling Right: Independent with assistive device Rolling Left: Independent with assistive device Supine to Sit: Independent with assistive device Sit to Supine: Independent with assistive device Transfers Sit to Stand: Supervision/Verbal cueing Stand to Sit: Supervision/Verbal cueing  Trunk/Postural Assessment  Cervical Assessment Cervical Assessment: Exceptions to [facility] (foreards head) Thoracic Assessment Thoracic  Assessment: Exceptions to [facility] (rounded shoulders) Lumbar Assessment Lumbar Assessment: Exceptions to [facility] (posterior pelvic tilt) Postural Control Postural Control: Deficits on evaluation Righting Reactions: Delayed secondary to global deconditioning  Balance Balance Balance Assessed: Yes Static Sitting Balance Static Sitting - Balance Support: Feet supported;Bilateral upper extremity supported Static Sitting - Level of Assistance: 7: Independent Dynamic Sitting Balance Dynamic Sitting - Balance Support: Feet supported;No upper extremity supported Dynamic Sitting - Level of Assistance: 6: Modified  independent (Device/Increase time) Static Standing Balance Static Standing - Balance Support: Bilateral upper extremity supported;During functional activity (RW) Static Standing - Level of Assistance: 5: Stand by assistance (supervision) Dynamic Standing Balance Dynamic Standing - Balance Support: Bilateral upper extremity supported;During functional activity (RW) Dynamic Standing - Level of Assistance: 5: Stand by assistance (supervision) Dynamic Standing - Comments: with transfers and gait Extremity/Trunk Assessment RUE Assessment RUE Assessment: Within Functional Limits Passive Range of Motion (PROM) Comments: WFL Active Range of Motion (AROM) Comments: limited at 90 degrees shoulder flexion, WFL distally General Strength Comments: 3+/5 LUE Assessment LUE Assessment: Within Functional Limits Passive Range of Motion (PROM) Comments: WFL Active Range of Motion (AROM) Comments: WFL General Strength Comments: 4-/5 grossly    E Willard Madrigal, MS, OTR/L  06/11/2023, 12:27 PM

## 2023-06-09 NOTE — Plan of Care (Signed)
  Problem: Consults Goal: RH GENERAL PATIENT EDUCATION Description: See Patient Education module for education specifics. Outcome: Progressing   Problem: RH BOWEL ELIMINATION Goal: RH STG MANAGE BOWEL WITH ASSISTANCE Description: STG Manage Bowel with min Assistance. Outcome: Progressing Goal: RH STG MANAGE BOWEL W/MEDICATION W/ASSISTANCE Description: STG Manage Bowel with Medication with min Assistance. Outcome: Progressing   Problem: RH SAFETY Goal: RH STG ADHERE TO SAFETY PRECAUTIONS W/ASSISTANCE/DEVICE Description: STG Adhere to Safety Precautions With min Assistance/Device. Outcome: Progressing Goal: RH STG DECREASED RISK OF FALL WITH ASSISTANCE Description: STG Decreased Risk of Fall With min Assistance. Outcome: Progressing   Problem: RH PAIN MANAGEMENT Goal: RH STG PAIN MANAGED AT OR BELOW PT'S PAIN GOAL Description: Pain will be managed 3 out of 10 on pain scale with PRN medications min assist  Outcome: Progressing   Problem: RH KNOWLEDGE DEFICIT GENERAL Goal: RH STG INCREASE KNOWLEDGE OF SELF CARE AFTER HOSPITALIZATION Description: Patient/caregiver will be able to manage medications and COPD exacerbations from nursing education, nursing handouts and other resources independently  Outcome: Progressing

## 2023-06-09 NOTE — Progress Notes (Addendum)
Patient ID: Lisa [name], female   DOB: Feb 17, 1934, 89 y.o.   MRN: [id number]   Team Conference Report to Patient/Family  Team Conference discussion was reviewed with the patient and caregiver, including goals, any changes in plan of care and target discharge date.  Patient and caregiver express understanding and are in agreement.  The patient has a target discharge date of 06/11/23.  SW met with patient and daughter, Lisa [name]. Daughter present for some education not all. Sw informed pt and family that patient is medically stable to d/c Friday. No additional questions or concerns.  Andria Rhein 06/09/2023, 2:05 PM

## 2023-06-09 NOTE — Progress Notes (Signed)
PROGRESS NOTE   Subjective/Complaints: Sleepy this morning Still with thick green phlegm, CXR ordered SBP slightly elevated Home health approved    ROS:  +thick green phelgm  Objective:   No results found. No results for input(s): "WBC", "HGB", "HCT", "PLT" in the last 72 hours.   No results for input(s): "NA", "K", "CL", "CO2", "GLUCOSE", "BUN", "CREATININE", "CALCIUM" in the last 72 hours.   Intake/Output Summary (Last 24 hours) at 06/09/2023 0939 Last data filed at 06/09/2023 3295 Gross per 24 hour  Intake 716 ml  Output --  Net 716 ml        Physical Exam: Vital Signs Blood pressure (!) 148/63, pulse 68, temperature 97.8 F (36.6 C), resp. rate 16, height 5\' 3"  (1.6 m), weight 70.9 kg, SpO2 100%.  Gen: no distress, normal appearing HEENT: oral mucosa pink and moist, NCAT Cardio: Reg rate Chest: normal effort, normal rate of breathing Abd: soft, non-distended Ext: no edema Psych: pleasant, normal affect  Skin: Dry without signs of breakdown Neuro: Alert and awake, follows commands,  Normal language and speech. Cranial nerve exam unremarkable. A little HOH. MMT: UE grossly 4 to 4+/5 prox to distal. LE 3+ prox to 4+/5 ADF/PF.  Sensation intact light touch in all 4 extremities. DTR's 1+. No abnl resting tone.   Musculoskeletal: chronic DJD in hands, knees and feet. Minimal joint pain with ROM  Ambulating CG Assessment/Plan: 1. Functional deficits which require 3+ hours per day of interdisciplinary therapy in a comprehensive inpatient rehab setting. Physiatrist is providing close team supervision and 24 hour management of active medical problems listed below. Physiatrist and rehab team continue to assess barriers to discharge/monitor patient progress toward functional and medical goals  Care Tool:  Bathing    Body parts bathed by patient: Right arm, Left arm, Chest, Abdomen, Front perineal area, Face, Right  upper leg, Left upper leg, Right lower leg, Left lower leg   Body parts bathed by helper: Buttocks     Bathing assist Assist Level: Minimal Assistance - Patient > 75%     Upper Body Dressing/Undressing Upper body dressing   What is the patient wearing?: Pull over shirt    Upper body assist Assist Level: Supervision/Verbal cueing    Lower Body Dressing/Undressing Lower body dressing      What is the patient wearing?: Pants, Incontinence brief     Lower body assist Assist for lower body dressing: Contact Guard/Touching assist     Toileting Toileting    Toileting assist Assist for toileting: Contact Guard/Touching assist     Transfers Chair/bed transfer  Transfers assist     Chair/bed transfer assist level: Contact Guard/Touching assist     Locomotion Ambulation   Ambulation assist      Assist level: Contact Guard/Touching assist Assistive device: Walker-rolling Max distance: 13ft   Walk 10 feet activity   Assist     Assist level: Contact Guard/Touching assist Assistive device: Walker-rolling   Walk 50 feet activity   Assist Walk 50 feet with 2 turns activity did not occur: Safety/medical concerns (Patient unable to ambulate >17' at this time secondary to global deconditioning and weakness)  Assist level: Contact Guard/Touching assist Assistive device:  Walker-rolling    Walk 150 feet activity   Assist Walk 150 feet activity did not occur: Safety/medical concerns  Assist level: Contact Guard/Touching assist Assistive device: Walker-rolling    Walk 10 feet on uneven surface  activity   Assist     Assist level: Contact Guard/Touching assist Assistive device: Walker-rolling   Wheelchair     Assist Is the patient using a wheelchair?: Yes Type of Wheelchair: Manual    Wheelchair assist level: Supervision/Verbal cueing Max wheelchair distance: 183ft    Wheelchair 50 feet with 2 turns activity    Assist        Assist  Level: Supervision/Verbal cueing   Wheelchair 150 feet activity     Assist      Assist Level: Supervision/Verbal cueing   Blood pressure (!) 148/63, pulse 68, temperature 97.8 F (36.6 C), resp. rate 16, height 5\' 3"  (1.6 m), weight 70.9 kg, SpO2 100%.   Medical Problem List and Plan: 1. Functional deficits secondary to debility secondary to acute on chronic hypoxic and hypercapnic respiratory failure/COPD exacerbation/CAP/acute diastolic heart failure/acute delirium..  Patient uses supplemental oxygen at baseline at home of 2 L.             -patient may shower             -ELOS/Goals: 14 days, mod I to supervision goals with PT and OT  -Continue CIR  IPOC completed  Discussed with therapy that she tolerated therapy very well today without symptoms, can continue therapy and have reached out to cardiology for HR parameters  Updated grandson  Sent message to team to discuss progress, family ed, and whether she can d/c home earlier, discussed with patient that if her children are able to provide MinA we can sent her home earlier  Con't CIR PT and OT  Team conference 8/7 2.  Antithrombotics: -DVT/anticoagulation:  Mechanical: Antiembolism stockings, thigh (TED hose) Bilateral lower extremities             -antiplatelet therapy: N/A 3. Pain Management: Oxycodone 5 mg every 6 hours as needed moderate pain  -7/28 pain controlled, not using oxycodone frequently  8/5- Oxy has been stopped 4. Mood/Behavior/Sleep: Remeron 15 mg nightly             -pt reports that she continues to struggle with sleep. We discussed melatonin                         -will begin 3mg  melatonin scheduled at bedtime             -antipsychotic agents: N/A 5. Neuropsych/cognition: This patient is capable of making decisions on her own behalf. 6. Skin/Wound Care: Routine skin checks 7. Fluids/Electrolytes/Nutrition: Routine and analysis with follow-up chemistries 8.  Hyper/hypokalemia.  Resolved with aggressive  treatments.  Her Lokelma has since been discontinued.  Patient had resumed her home oral dose of Lasix. -Recheck labs Monday  8/5- K+ 3.9- doing well 9.  Symptomatic chronic hyponatremia.  Nephrology follow-up.  Urine sodium 75 with 275 urine osmolality.  Plasma sodium normalizing -Follow-up chemistries, monitor intake -7/28 Recheck tomorrow  8/5- Na 136 10.  Acute HFpEF exacerbation .  Imdur 30 mg daily,  Lasix 20 mg daily, Toprol XL 25 mg daily.  Monitor for any signs of fluid overload.  Daily weights- weight reviewed and is stable   Filed Weights   06/07/23 0721 06/08/23 0500 06/09/23 0401  Weight: 70.8 kg 70.8 kg 70.9 kg  11.  Chronic orthostasis.  Florinef 0.1 mg daily.  Monitor with increased mobility 12.  MDS/CMML C/B neutropenia and thrombocytopenia.  Followed by hematology services Dr.Katragadda. 13.  Recent GI bleed.  Follow-up CBC on Monday - Protonix  daily for prophylaxis. 8/5- Hb 8.5- on 7/29- hasn't been checked since 14.  Recent admission 6/4 - 6/7 for non-STEMI.  Followed by cardiology services Dr.Mallipeddi 15.  PAF.  Not a candidate for anticoagulation due to history of GI bleed.             -8/6- BP slightly elevated this AM, but usually controlled- con't regimen    06/09/2023    7:45 AM 06/09/2023    5:14 AM 06/09/2023    4:01 AM  Vitals with BMI  Weight   156 lbs 5 oz  BMI   27.7  Systolic 148 163   Diastolic 63 85   Pulse 68 64     16.  Hypothyroidism.  Synthroid 17.  Hyperlipidemia.  Pravachol  18.  Urinary frequency.   -UA and reflex culture ordered, discussed that UA is negative  19. Hypotension: resolved, continue lasix 20mg , discussed with grand son dosing lasix based on standing weights. BP reviewed and is stable, maintain this dose  20. Hypokalemia: klor ordered x2 days this admission, repeat Monday  21. Afib with RVR: cardiology consulted, continue lopressor, discussed her symptoms with cardiology, discussed that cardiology decided to  maintain current metoprolol dose given her bradycardia, discussed that due to the afib she is at risk for hear rate spikes. Flowsheet reviewed and HR is currently stable, increase magnesium gluconate to 500mg  HS, continue  22. COPD: placed parameters for O2 to be titrated to goal of 88-92% or used prn for comfort, attempted wean to 1L but desatted to 86%  8/5- Wasn't wearing O2 this AM- put back on for pt.   8/6- pt needs O2- when drops from 2L to 1L, drops to 86%on 1L- asked PT to do documentation for need of O2.  23. Overweight: provide dietary education. Magnesium supplement started    24. History of magnesium deficiency: continue magnesium gluconate to 500mg  HS  25. Constipation: increase magnesium gluconate to 500mg  HS, d/c oxycodone as nod needing, will add senna HS. Last BM noted to be 8/5. Psyllium ordered  26. Green phlegm: CXR ordered  I spent >57mins performing patient care related activities, including face to face time, documentation time, med management, discussion of meds and lab orders with patient, and overall coordination of care.      LOS: 12 days A FACE TO FACE EVALUATION WAS PERFORMED  Horton Chin 06/09/2023, 9:39 AM

## 2023-06-09 NOTE — Progress Notes (Signed)
Patient ID: Lisa [name], female   DOB: [birth year], 89 y.o.   MRN: [id number]  Portable 02 ordered through [employer].

## 2023-06-09 NOTE — Patient Care Conference (Signed)
Inpatient RehabilitationTeam Conference and Plan of Care Update Date: 06/09/2023   Time: 11:38 AM    Patient Name: Lisa [name]      Medical Record Number: [id number]  Date of Birth: 1934/09/08 Sex: Female         Room/Bed: 4W20C/4W20C-01 Payor Info: Payor: MEDICARE / Plan: MEDICARE PART A AND B / Product Type: *No Product type* /    Admit Date/Time:  05/28/2023  2:25 PM  Primary Diagnosis:  Debility  Hospital Problems: Principal Problem:   Debility    Expected Discharge Date: Expected Discharge Date: 06/11/23  Team Members Present: Physician leading conference: Dr. Sula Soda Social Worker Present: Lavera Guise, BSW Nurse Present: Chana Bode, Eden Lathe, RN PT Present: Raechel Chute, PT OT Present: Candee Furbish, OT PPS Coordinator present : Fae Pippin, SLP     Current Status/Progress Goal Weekly Team Focus  Bowel/Bladder   Patient continent of bowel and bladder. LBM 8/5  *** Remains continent of bowel and bladder.   Assess for needs.    Swallow/Nutrition/ Hydration      ***         ADL's   Supervision UB, Min A LB, CGA/min A toileting  *** Supervision overall   Barriers- endurance and sequencing/problem solving deficits. Plan for family to attend education for review of supervision goals and occasional min A for ADLs    Mobility   bed mobility with supervision and use of bed features, sit<>stands with RW and CGA/min A, stand<>pivot transfers with RW and CGA, ambulate up to 18ft with RW and CGA/close supervision, propel WC 168ft using BUE and supervision, and navigate 4 6in steps with bilateral handrails and min A  *** downgraded to supervision  barriers: global weakness/deconditioning and family education    Communication      ***          Safety/Cognition/ Behavioral Observations     ***          Pain   Patient without complaints of pain.  *** Remains comfortable.   Assess for comfort needs.    Skin   Skin intact.   *** Skin remains intact.  Assess for needs.      Discharge Planning:  Discharging home with daughter to assist. Family education tomorrow with daughter 38-12.   Team Discussion: *** Patient on target to meet rehab goals: {IP REHAB YES/NO WITH VHQIONGEX:52841}  *See Care Plan and progress notes for long and short-term goals.   Revisions to Treatment Plan:  ***  Teaching Needs: ***  Current Barriers to Discharge: {BARRIERS TO LKGMWNUUV:25366}  Possible Resolutions to Barriers: ***     Medical Summary Current Status: hypertension, COPD with desaturation. oxygen dependence, green phlegm production, constipation  Barriers to Discharge: Medical stability  Barriers to Discharge Comments: hypertension, COPD with desaturation. oxygen dependence, green phlegm production, constipation Possible Resolutions to [employer] Focus: decrease florinef, continue O2, check CXR today, continue psyllium/senna/mag gluconate   Continued Need for Acute Rehabilitation Level of Care: The patient requires daily medical management by a physician with specialized training in physical medicine and rehabilitation for the following reasons: Direction of a multidisciplinary physical rehabilitation program to maximize functional independence : Yes Medical management of patient stability for increased activity during participation in an intensive rehabilitation regime.: Yes Analysis of laboratory values and/or radiology reports with any subsequent need for medication adjustment and/or medical intervention. : Yes   I attest that I was present, lead the team conference, and concur with the assessment and plan of the  team.   Jearld Adjutant 06/09/2023, 11:55 AM

## 2023-06-09 NOTE — Progress Notes (Signed)
Occupational Therapy Session Note  Patient Details  Name: Mckenzy Becerril MRN: [id number] Date of Birth: 03-06-1934  Today's Date: 06/09/2023 OT Individual Time: 8295-6213 & 0865-7846 OT Individual Time Calculation (min): 56 min & 40 min   Short Term Goals: Week 2:  OT Short Term Goal 1 (Week 2): STG = LTG 2/2 ELOS  Skilled Therapeutic Interventions/Progress Updates:  Session 1 Skilled OT intervention completed with focus on ADL retraining. Pt received slumped over in bed, asleep, easily aroused, agreeable to session. No pain reported.  [place] with supplemental O2 noted to up above her nose with pt unaware. Sat at 86%. Donned 2L per order, with couple mins needed to rebound to 90%. Maintained 88% or greater throughout session while on 2L though extended rest breaks in between functional activities needed for fatigue and noted SOB. Attempted to teach pursed lip breathing, however pt with difficulty coordinating the breathe in through nostrils.   Daughter not present at start of session for arranged education. Completed bed mobility to EOB with supervision without bed rail assist. Cues needed to scoot forward for feet to contact floor. Min A sit > stand using RW with bed at lowest position then CGA/supervision ambulatory transfer with RW to [facility] over toilet. Cues needed to initiate doffing clothing down prior to sitting. Continent of void, however pt request to change pull up. Able to thread LLE into pull up but difficulty with rest including pants 2/2 height of BSC therefore min A needed. Stood with supervision using RW, with pericare and donning of clothing over hips with supervision, however 2 stands needed for fatigue.   Ambulated with CGA using RW to w/c with directional cues needed, as well as for backwards walking to w/c with safety cues needed to not prematurely sit without reaching back. Able to doff/donn shirt with set up A, assist only for [place] management. Supervision for oral care and grooming;  noted to still be coughing up green phlegm; MD notified.   OT called daughter with report that she forgot she had an appt and was running behind. Daughter agreeable to attend education in OT PM session and other therapies today.  Transported dependently in w/c <> gym for time and energy conservation. Participated in dynamic balance task during horse shoe game x2. Supervision for sit > stand with RW and supervision/CGA for dynamic standing balance. Back in room, able to ambulate with CGA to recliner with RW. Pt remained upright in recliner, with chair alarm on/activated, and with all needs in reach at end of session.  Session 2 Skilled OT intervention completed with focus on family education with daughter present regarding tub/shower transfers and safety with mobility. Pt received seated in recliner with daughter present, agreeable to session. No pain reported.  Daughter at bedside, but O2 via [place] noted to be off of pt. Sat reading at 76% on RA, with daughter reporting that nursing removed for bathroom needs, however daughter didn't initiate redonning of the [place] until OT addressed. Sat able to rebound to 90% on 2L after several mins and remained for duration of session. Discussed with daughter that O2 needs to be on at all times, especially during high activity including mobility and showers. CSW and MD notified that pt doesn't have portable O2 tank at home, only a wall unit and will need for OP appts.   OT assisted pt for time purposes with CGA sit > stand using RW, CGA ambulatory transfer with RW to w/c. Dependent transport in w/c <> ADL bathroom. Discussed TTB  to be used for bridging the gap over the tub threshold. OT provided safety considerations and techniques for transfer with the TTB. Daughter assisted with initial attempt at mod A sit > stand however after cues provided to daughter to cue pt with anterior weight shifting and scooting forward, pt able to stand with supervision using RW. OT assisted  with O2 tank management, as pt will have long cord only that daughter is used to managing. Daughter assisted with CGA ambulatory transfer to TTB. CGA sit > pivot <> TTB. Suggested use of HHSH for ease of bathing, shower curtain tuck method to prevent water spillage, minimizing stands via lateral leans for pericare, use of grab bars/installation as well as effective safety strategies for exiting the shower to eliminate falls.   Pt with difficulty powering up from TTB without arm rest, therefore cues provided to daughter and pt about body positioning set up and using side of bench to power up with OT assisting pt at min A level to stand with RW. Daughter provided CGA for ambulatory transfer to w/c.  Back in room, pt returned to recliner with CGA using RW, then remained seated in recliner, with chair alarm on/activated, daughter present and with all needs in reach at end of session.   Therapy Documentation Precautions:  Precautions Precautions: Fall Precaution Comments: 2L oxygen via ; HOH- No hearing aids Restrictions Weight Bearing Restrictions: No    Therapy/Group: Individual Therapy  Melvyn Novas, MS, OTR/L  06/09/2023, 3:58 PM

## 2023-06-09 NOTE — Progress Notes (Signed)
Physical Therapy Session Note  Patient Details  Name: Lisa [name] MRN: [id number] Date of Birth: 21-Jul-1934  Today's Date: 06/09/2023 PT Individual Time: 7829-5621 and 1415-1455 PT Individual Time Calculation (min): 56 min and 40 min  Short Term Goals: Week 1:  PT Short Term Goal 1 (Week 1): Patient will perform bed mobility with MinA PT Short Term Goal 1 - Progress (Week 1): Met PT Short Term Goal 2 (Week 1): Patient will perform sit/stand with LRAD and MinA consistently PT Short Term Goal 2 - Progress (Week 1): Met PT Short Term Goal 3 (Week 1): Patient will ambulate >50' with LRAD and MinA PT Short Term Goal 3 - Progress (Week 1): Met Week 2:  PT Short Term Goal 1 (Week 2): STG=LTG due to LOS  Skilled Therapeutic Interventions/Progress Updates:   Treatment Session 1 Received pt sitting in recliner asleep. Upon wakening, pt agreeable to PT treatment and denied any pain initially, but later complained of bilateral knee pain. Pt on 2L with SPO2 >92% throughout session. Pt's daughter did not arrive until end of session for family education. Session with emphasis on functional mobility/transfers, generalized strengthening and endurance, dynamic standing balance/coordination, and gait training.   Pt required multiple attempts and min A to stand from recliner and ambulated 161ft with RW and CGA fading to close supervision to dayroom - getting L and R confused. Stood from [facility] with RW and min A x 2 trials (cues to scoot to EOM, get feet underneath her, anterior weight shifting, and to push up through legs) and performed alternating toe taps to 3 cones 2x3 trials with CGA/light min A for balance and BUE support on RW - emphasis on hip flexion and dynamic standing balance. Pt required mod cues for sequencing instructions for task.   Pt stood from EOM x 3 additional trials with RW and min A and performed TUG with RW and close supervision with average of 45.3 seconds. Pt educated on test results and  significance indicating high fall risk and recommendation to use RW upon discharge - pt verbalized understanding and in agreement. Pt required cues to back up completley to mat with RW, as pt as tendency to toss it aside prior to sitting.  Trial 1: 39 seconds Trial 2: 53 seconds  Trial 3: 44 seconds  Stood from EOM with RW and min A and ambulated 149ft with RW and close supervision with assist for equipment management. Pt reported fatigue, stating "I feel like I'm about to pass out" - SPO2 93-94% and HR 91bpm after ambulating and symptoms improved with seated rest. Pt transferred mat<>WC stand<>pivot with RW and close supervision (min A to stand) and transported back to room in WC dependently. Stood from [facility] pushing with BUE support on WC with close supervision and ambulated to recliner. Pt's daughter arrived at end of session and verbalized confidence with all tasks to ensure safety with discharge. Therapist emphasized pt's fluctuations with standing (supervision/min A) and daughter verbalized confidence, reporting she was taking care of the pt at home prior. Also recommended to daughter that pt use RW at home. Pt's daughter with questions regarding portable O2 and humidifier for home use - MD and CSW notified. Concluded session with pt sitting in recliner, needs within reach, and chair pad alarm on.  Treatment Session 2 Received pt sitting in recliner with daughter present for family education training. Pt agreeable to PT treatment and denied any pain during session. Pt on 2L with SPO2 >92% throughout session. Session with emphasis  on discharge planning, functional mobility/transfers, generalized strengthening and endurance, simulated car transfers, stair navigation, and gait training. Pt stood from recliner with min A provided by daughter and transferred into [facility] via stand<>pivot with RW and CGA.   Pt transported to/from room in [facility] dependently for time management purposes. Stood from [facility] with RW and min A  provided by daughter and navigated 4 in steps with bilateral handrails and CGA/close supervision ascending and descending with a step to pattern - cued daughter to remain behind pt when ascending steps and in front when descending. Also recommended to have pt wait at bottom of steps while daughter brings RW to top of steps, that way RW is ready and waiting when pt gets to top of steps - daughter verbalized understanding and stated this is the method they used prior to admission.   In ortho gym, pt stood from [facility] with RW and min A provided by daughter and performed simulated car transfer with RW and min A (daughter quick to provide assist for leg management, although pt could have done it herself - encouraged allowing pt to be as independent as possible). Returned to room and daughter assisted pt back to recliner (provided min A to stand from [facility] and close supervision while pt ambulated ~58ft to recliner).   Educated pt's daughter on the following: -use of gait belt -appropriate verbal cues to provide to pt (pushing up from [employer] with BUE support and "leaning forward" to stand) -RW safety and importance of using RW at all times due to pt's high fall risk status -locking WC brakes and donning/doffing WC legrests to avoid tripping hazards (pt's daughter required mod/max cues from therapist to lock brakes throughout session) -using pulse oximeter to monitor O2 at home -need for pt to use O2 at all times (daughter reporting they would take pt off oxygen when traveling to/from appointments) Went through sensation and pain interference questionnaire. Concluded session with pt sitting in recliner, needs within reach, and chair pad alarm on.  Therapy Documentation Precautions:  Precautions Precautions: Fall Precaution Comments: 2L oxygen via [place]; HOH- No hearing aids Restrictions Weight Bearing Restrictions: No  Therapy/Group: Individual Therapy [name] PT, DPT 06/09/2023, 6:47 AM

## 2023-06-10 MED ORDER — FUROSEMIDE 20 MG PO TABS
10.0000 mg | ORAL_TABLET | Freq: Once | ORAL | Status: AC
  Administered 2023-06-10: 10 mg via ORAL
  Filled 2023-06-10: qty 1

## 2023-06-10 NOTE — Progress Notes (Signed)
PROGRESS NOTE   Subjective/Complaints: No new complaints this morning Discussed d/c tomorrow    ROS:  +thick green phelgm  Objective:   DG Chest 2 View  Result Date: 06/09/2023 CLINICAL DATA:  Phlegm in throat.  Feeling drowsy. EXAM: CHEST - 2 VIEW COMPARISON:  X-ray 06/02/2023 FINDINGS: Enlarged cardiopericardial silhouette with vascular congestion. Trace interstitial edema. Kyphotic x-ray obscures the left lung apex. No obvious pneumothorax. Surgical clips along the left hemithorax. Elevated left hemidiaphragm. IMPRESSION: Enlarged heart with vascular congestion and trace edema. Rotated kyphotic x-ray obscures the left lung apex on the frontal view. Electronically Signed   By: Karen Kays M.D.   On: 06/09/2023 17:35   Recent Labs    06/09/23 1606  WBC 2.8*  HGB 8.2*  HCT 30.0*  PLT 69*     No results for input(s): "NA", "K", "CL", "CO2", "GLUCOSE", "BUN", "CREATININE", "CALCIUM" in the last 72 hours.   Intake/Output Summary (Last 24 hours) at 06/10/2023 1033 Last data filed at 06/10/2023 0858 Gross per 24 hour  Intake 657 ml  Output --  Net 657 ml        Physical Exam: Vital Signs Blood pressure (!) 142/68, pulse 64, temperature 98.1 F (36.7 C), temperature source Oral, resp. rate 17, height 5\' 3"  (1.6 m), weight 72.2 kg, SpO2 100%. Gen: no distress, normal appearing HEENT: oral mucosa pink and moist, NCAT Cardio: Reg rate Chest: normal effort, normal rate of breathing Abd: soft, non-distended Ext: no edema Psych: pleasant, normal affect  Skin: Dry without signs of breakdown Neuro: Alert and awake, follows commands,  Normal language and speech. Cranial nerve exam unremarkable. A little HOH. MMT: UE grossly 4 to 4+/5 prox to distal. LE 3+ prox to 4+/5 ADF/PF.  Sensation intact light touch in all 4 extremities. DTR's 1+. No abnl resting tone.   Musculoskeletal: chronic DJD in hands, knees and feet. Minimal  joint pain with ROM  Ambulating CG Assessment/Plan: 1. Functional deficits which require 3+ hours per day of interdisciplinary therapy in a comprehensive inpatient rehab setting. Physiatrist is providing close team supervision and 24 hour management of active medical problems listed below. Physiatrist and rehab team continue to assess barriers to discharge/monitor patient progress toward functional and medical goals  Care Tool:  Bathing    Body parts bathed by patient: Right arm, Left arm, Chest, Abdomen, Front perineal area, Face, Right upper leg, Left upper leg, Right lower leg, Left lower leg   Body parts bathed by helper: Buttocks     Bathing assist Assist Level: Minimal Assistance - Patient > 75%     Upper Body Dressing/Undressing Upper body dressing   What is the patient wearing?: Pull over shirt    Upper body assist Assist Level: Supervision/Verbal cueing    Lower Body Dressing/Undressing Lower body dressing      What is the patient wearing?: Pants, Incontinence brief     Lower body assist Assist for lower body dressing: Contact Guard/Touching assist     Toileting Toileting    Toileting assist Assist for toileting: Contact Guard/Touching assist     Transfers Chair/bed transfer  Transfers assist     Chair/bed transfer assist level: Supervision/Verbal cueing  Locomotion Ambulation   Ambulation assist      Assist level: Supervision/Verbal cueing Assistive device: Walker-rolling Max distance: 139ft   Walk 10 feet activity   Assist     Assist level: Supervision/Verbal cueing Assistive device: Walker-rolling   Walk 50 feet activity   Assist Walk 50 feet with 2 turns activity did not occur: Safety/medical concerns (Patient unable to ambulate >17' at this time secondary to global deconditioning and weakness)  Assist level: Supervision/Verbal cueing Assistive device: Walker-rolling    Walk 150 feet activity   Assist Walk 150 feet  activity did not occur: Safety/medical concerns  Assist level: Supervision/Verbal cueing Assistive device: Walker-rolling    Walk 10 feet on uneven surface  activity   Assist     Assist level: Supervision/Verbal cueing Assistive device: Walker-rolling   Wheelchair     Assist Is the patient using a wheelchair?: Yes Type of Wheelchair: Manual    Wheelchair assist level: Independent Max wheelchair distance: 128ft    Wheelchair 50 feet with 2 turns activity    Assist        Assist Level: Independent   Wheelchair 150 feet activity     Assist      Assist Level: Independent   Blood pressure (!) 142/68, pulse 64, temperature 98.1 F (36.7 C), temperature source Oral, resp. rate 17, height 5\' 3"  (1.6 m), weight 72.2 kg, SpO2 100%.   Medical Problem List and Plan: 1. Functional deficits secondary to debility secondary to acute on chronic hypoxic and hypercapnic respiratory failure/COPD exacerbation/CAP/acute diastolic heart failure/acute delirium..  Patient uses supplemental oxygen at baseline at home of 2 L.             -patient may shower             -ELOS/Goals: 14 days, mod I to supervision goals with PT and OT  -Continue CIR  IPOC completed  Discussed with therapy that she tolerated therapy very well today without symptoms, can continue therapy and have reached out to cardiology for HR parameters  Updated grandson  Sent message to team to discuss progress, family ed, and whether she can d/c home earlier, discussed with patient that if her children are able to provide MinA we can sent her home earlier  Con't CIR PT and OT  Team conference 8/7 2.  Antithrombotics: -DVT/anticoagulation:  Mechanical: Antiembolism stockings, thigh (TED hose) Bilateral lower extremities             -antiplatelet therapy: N/A 3. Pain Management: Oxycodone 5 mg every 6 hours as needed moderate pain  -7/28 pain controlled, not using oxycodone frequently  8/5- Oxy has been  stopped 4. Mood/Behavior/Sleep: Remeron 15 mg nightly             -pt reports that she continues to struggle with sleep. We discussed melatonin                         -will begin 3mg  melatonin scheduled at bedtime             -antipsychotic agents: N/A 5. Neuropsych/cognition: This patient is capable of making decisions on her own behalf. 6. Skin/Wound Care: Routine skin checks 7. Fluids/Electrolytes/Nutrition: Routine and analysis with follow-up chemistries 8.  Hyper/hypokalemia.  Resolved with aggressive treatments.  Her Lokelma has since been discontinued.  Patient had resumed her home oral dose of Lasix. -Recheck labs Monday  8/5- K+ 3.9- doing well 9.  Symptomatic chronic hyponatremia.  Nephrology follow-up.  Urine sodium 75 with 275 urine osmolality.  Plasma sodium normalizing -Follow-up chemistries, monitor intake -7/28 Recheck tomorrow  8/5- Na 136 10.  Acute HFpEF exacerbation .  Imdur 30 mg daily,  Lasix 20 mg daily, Toprol XL 25 mg daily.  Monitor for any signs of fluid overload.  Daily weights- weight reviewed and is slightly increased 8/8- additional lasix ordered   Filed Weights   06/08/23 0500 06/09/23 0401 06/10/23 0500  Weight: 70.8 kg 70.9 kg 72.2 kg    11.  Chronic orthostasis.  Florinef 0.1 mg daily.  Monitor with increased mobility 12.  MDS/CMML C/B neutropenia and thrombocytopenia.  Followed by hematology services Dr.Katragadda. 13.  Recent GI bleed.  Follow-up CBC on Monday - Protonix  daily for prophylaxis. 8/5- Hb 8.5- on 7/29- hasn't been checked since 14.  Recent admission 6/4 - 6/7 for non-STEMI.  Followed by cardiology services Dr.Mallipeddi 15.  PAF.  Not a candidate for anticoagulation due to history of GI bleed.             -8/6- BP slightly elevated this AM, but usually controlled- con't regimen    06/10/2023    5:00 AM 06/09/2023    7:16 PM 06/09/2023    1:53 PM  Vitals with BMI  Weight 159 lbs 3 oz    BMI 28.2    Systolic  142 158  Diastolic   68 49  Pulse  64 64    16.  Hypothyroidism.  Synthroid 17.  Hyperlipidemia.  Pravachol  18.  Urinary frequency.   -UA and reflex culture ordered, discussed that UA is negative  19. Hypotension: resolved, continue lasix 20mg , discussed with grand son dosing lasix based on standing weights. BP reviewed and is stable, currently hypertensive, give additional lasix 8/8  20. Hypokalemia: klor ordered x2 days this admission, repeat Monday  21. Afib with RVR: cardiology consulted, continue lopressor, discussed her symptoms with cardiology, discussed that cardiology decided to maintain current metoprolol dose given her bradycardia, discussed that due to the afib she is at risk for hear rate spikes. Flowsheet reviewed and HR is currently stable, increase magnesium gluconate to 500mg  HS, continue  22. COPD: placed parameters for O2 to be titrated to goal of 88-92% or used prn for comfort, attempted wean to 1L but desatted to 86%  8/5- Wasn't wearing O2 this AM- put back on for pt.   8/6- pt needs O2- when drops from 2L to 1L, drops to 86%on 1L- asked PT to do documentation for need of O2.  23. Overweight: provide dietary education. Magnesium supplement started    24. History of magnesium deficiency: continue magnesium gluconate to 500mg  HS  25. Constipation: increase magnesium gluconate to 500mg  HS, d/c oxycodone as nod needing, will add senna HS. Last BM noted to be 8/5. Psyllium ordered  26. Green phlegm: CXR ordered, reviewed and shows no evidence of pneumonia  27.Trace edema on CXR: give additional 10mg  lasix on 8/8   LOS: 13 days A FACE TO FACE EVALUATION WAS PERFORMED  Lisa [name]  06/10/2023, 10:33 AM

## 2023-06-10 NOTE — Progress Notes (Signed)
Inpatient Rehabilitation Discharge Medication Review by a Pharmacist  A complete drug regimen review was completed for this patient to identify any potential clinically significant medication issues.  High Risk Drug Classes Is patient taking? Indication by Medication  Antipsychotic No   Anticoagulant No   Antibiotic No   Opioid No   Antiplatelet No   Hypoglycemics/insulin No   Vasoactive Medication Yes Lasix, Imdur, Toprol- HTN  Chemotherapy No   Other Yes Synthroid- hypothyroidism Remeron- sleep/MDD Protonix- GERD Pravachol- HLD Magnesium - supplement Melatonin/remeron - sleep Prednisone taper - COPD Fludrocortisone - BP support Albuterol/Trellergy - COPD     Type of Medication Issue Identified Description of Issue Recommendation(s)  Drug Interaction(s) (clinically significant)     Duplicate Therapy     Allergy     No Medication Administration End Date     Incorrect Dose     Additional Drug Therapy Needed     Significant med changes from prior encounter (inform family/care partners about these prior to discharge).    Other       Clinically significant medication issues were identified that warrant physician communication and completion of prescribed/recommended actions by midnight of the next day:  No   Time spent performing this drug regimen review (minutes):  [address] PharmD, [place], AAHIVP, CPP Infectious Disease Pharmacist 06/10/2023 1:07 PM

## 2023-06-10 NOTE — Progress Notes (Signed)
Inpatient Rehabilitation Care Coordinator Discharge Note   Patient Details  Name: Lisa [name] MRN: [id number] Date of Birth: September 19, 1934   Discharge location: Home  Length of Stay: 14 Days  Discharge activity level: Sup/Min A  Home/community participation: Daughter, son and grandson  Patient response QM:VHQION Literacy - How often do you need to have someone help you when you read instructions, pamphlets, or other written material from your doctor or pharmacy?: Often  Patient response GE:XBMWUX Isolation - How often do you feel lonely or isolated from those around you?: Never  Services provided included: SW, Neuropsych, Pharmacy, CM, TR, RN, SLP, OT, PT, RD, MD  Financial Services:  Financial Services Utilized: Medicare    Choices offered to/list presented to: Patient ans daughter  Follow-up services arranged:  Home Health, DME Home Health Agency: [facility] PT OT SLP    DME : Patient has all DME.    Patient response to transportation need: Is the patient able to respond to transportation needs?: Yes In the past 12 months, has lack of transportation kept you from medical appointments or from getting medications?: No In the past 12 months, has lack of transportation kept you from meetings, work, or from getting things needed for daily living?: No   Patient/Family verbalized understanding of follow-up arrangements:  Yes  Individual responsible for coordination of the follow-up plan: Frederik Schmidt [phone]  Confirmed correct DME delivered: Andria Rhein 06/10/2023    Comments (or additional information):  Summary of Stay    Date/Time Discharge Planning CSW  06/08/23 1419 Discharging home with daughter to assist. Family education tomorrow with daughter 50-12. CJB  06/01/23 1452 Discharging home with assistance from daughter and son to assist physically. Antony Haste is POA and lives O[place].  Son and daughter plan to care for patient 24/7. CJB       Andria Rhein

## 2023-06-10 NOTE — Progress Notes (Signed)
Occupational Therapy Session Note  Patient Details  Name: Roshani Demo MRN: [id number] Date of Birth: February 26, 1934  Today's Date: 06/10/2023 OT Individual Time: 4098-1191 OT Individual Time Calculation (min): 70 min    Short Term Goals: Week 2:  OT Short Term Goal 1 (Week 2): STG = LTG 2/2 ELOS  Skilled Therapeutic Interventions/Progress Updates:    Pt sleeping in recliner upon arrival. Min multimodal cues to arouse. Pt required extra time to reorient. Pt oriented to place, year, and day but not month. OT intervention with focus on discharge planning, safety awareness, sit<>stand, standing balance, and BUE therex. Sit<>stand from recliner with supervision and mod verbal cues for technique and safety. CGA for standing tasks. Multiple rest breaks. O2 >90% on 2L O2. Pt remained in recliner with seat alarm activated. All needs within reach.   Therapy Documentation Precautions:  Precautions Precautions: Fall Precaution Comments: 2L oxygen via [place]; HOH- No hearing aids Restrictions Weight Bearing Restrictions: No   Pain: Pt reports 5/10 Rt knee pain; repositioned  Rich Brave 06/10/2023, 11:55 AM

## 2023-06-10 NOTE — Progress Notes (Signed)
Physical Therapy Session Note  Patient Details  Name: Lisa [name] MRN: [id number] Date of Birth: 1934/10/24  Today's Date: 06/10/2023 PT Individual Time: 4098-1191 and 1400-1456 PT Individual Time Calculation (min): 70 min and 56 min  Short Term Goals: Week 1:  PT Short Term Goal 1 (Week 1): Patient will perform bed mobility with MinA PT Short Term Goal 1 - Progress (Week 1): Met PT Short Term Goal 2 (Week 1): Patient will perform sit/stand with LRAD and MinA consistently PT Short Term Goal 2 - Progress (Week 1): Met PT Short Term Goal 3 (Week 1): Patient will ambulate >50' with LRAD and MinA PT Short Term Goal 3 - Progress (Week 1): Met Week 2:  PT Short Term Goal 1 (Week 2): STG=LTG due to LOS  Skilled Therapeutic Interventions/Progress Updates:   Treatment Session 1 Received pt sitting upright in bed, pt agreeable to PT treatment, and reported mild pain in bilateral knees - RN aware. Pt on 2L with SPO2 91-97% with activity. Session with emphasis on discharge planning, functional mobility/transfers, toileting, generalized strengthening and endurance, dynamic standing balance/coordination, and gait training. Pt transferred sitting upright<>sitting R EOB mod I and donned shoes with max A for time management purposes.   Pt stood from elevated EOB with RW and close supervision and ambulated in/out of bathroom with RW and close supervision. Pt able to manage clothing standing with CGA/close supervision, void, and perform hygiene management without assist. Pt sat in WC at sink and brushed teeth/washed face with set up assist. Pt then performed WC mobility 1109ft using BUE and mod I with increased time - emphasis on UE strength. Transported to ortho gym in [facility] dependently and assessed MMT. Pt then performed ambulatory simulated car transfer with RW and close supervision with cues for safety when turning to back up to car. Pt then ambulated 70ft on uneven surfaces (ramp) with RW and close supervision.    Pt transported to dayroom in [facility] dependently and stood from [facility] with RW and close supervision. Pt able to pick up object from floor using reacher and close supervision. Pt then performed seated BLE strengthening on Kinetron at 20 cm/sec for 1 minute x 3 trials with emphasis on glute/quad strength to fatigue. Pt required frequent rest breaks throughout session due to fatigue. Stood from [facility] with RW and close supervision and ambulated 19ft with RW and supervision back to room. Concluded session with pt sitting in recliner, needs within reach, and chair pad alarm on. RN present at bedside.   Treatment Session 2 Received pt sitting in recliner asleep. Upon wakening, pt agreeable to PT treatment and denied any pain during session but reported feeling fatigued. Pt on 2L with SPO2 >90% throughout session. Session with emphasis on functional mobility/transfers, toileting, generalized strengthening and endurance, dynamic standing balance/coordination, and gait training. Pt reported urge to void and stood from recliner with RW and supervision. Pt ambulated in/out of bathroom with RW and close supervision. Pt able to manage clothing, void, and perform hygiene management without assist.   Pt then ambulated 123ft x 2 trials with RW and supervision to/from dayroom. Took extended seated rest break then performed seated BUE/BLE strengthening on Nustep at workload 2 for 7 minutes for a total of 158 steps with emphasis on cardiovascular endurance. Pt required 3 rest breaks during activity due to fatigue. Stood from elevated EOM x 3 additional trials and performed alternating marches 2x10 bilaterally, heel raises 2x10 bilaterally, and hip abduction 2x10 bilaterally. Returned to room and concluded session  with pt sitting in recliner, needs within reach, and chair pad alarm on.   Therapy Documentation Precautions:  Precautions Precautions: Fall Precaution Comments: 2L oxygen via [place]; HOH- No hearing  aids Restrictions Weight Bearing Restrictions: No  Therapy/Group: Individual Therapy [name] PT, DPT 06/10/2023, 7:03 AM

## 2023-06-10 NOTE — Progress Notes (Addendum)
Physical Therapy Discharge Summary  Patient Details  Name: Lisa [name] MRN: [id number] Date of Birth: [birth year]  Date of Discharge from PT service:June 10, 2023  Patient has met 9 of 9 long term goals due to improved activity tolerance, improved balance, improved postural control, increased strength, ability to compensate for deficits, improved awareness, and improved coordination. Patient to discharge at an ambulatory level supervision with RW. Patient's care partner is independent to provide the necessary physical and cognitive assistance at discharge. Pt's daughter attended family education training on 8/7 and verbalized confidence with all tasks to ensure safe discharge home. However, therapist did have to provide frequent cues for safety with transfer technique using RW.   All goals met   Recommendation:  Patient will benefit from ongoing skilled PT services in home health setting to continue to advance safe functional mobility, address ongoing impairments in transfers, generalized strengthening and endurance, dynamic standing balance/coordination, gait training, and to minimize fall risk.  Equipment: No equipment provided - already has RW  Reasons for discharge: treatment goals met  Patient/family agrees with progress made and goals achieved: Yes  PT Discharge Precautions/Restrictions Precautions Precautions: Fall Precaution Comments: 2L oxygen via [place]; HOH- No hearing aids Restrictions Weight Bearing Restrictions: No Pain Interference Pain Interference Pain Effect on Sleep: 1. Rarely or not at all Pain Interference with Therapy Activities: 1. Rarely or not at all Pain Interference with Day-to-Day Activities: 1. Rarely or not at all Cognition Overall Cognitive Status:  (cognitive baseline unknown) Arousal/Alertness: Awake/alert Orientation Level: Oriented to person;Oriented to place;Oriented to situation Memory: Impaired Awareness: Appears intact Problem Solving:  Impaired Safety/Judgment: Impaired Comments: requires cues for safety Sensation Sensation Light Touch: Impaired by gross assessment Hot/Cold: Not tested Proprioception: Appears Intact Stereognosis: Not tested Additional Comments: pt reports BLE and BUE numbness, tingling, burning - hx neuropathy Coordination Gross Motor Movements are Fluid and Coordinated: No Fine Motor Movements are Fluid and Coordinated: No Coordination and Movement Description: global weakness/deconditioning and impaired sequencing with more complex tasks Finger Nose Finger Test: dysmetria RUE>LUE Motor  Motor Motor: Other (comment) Motor - Skilled Clinical Observations: global weakness/deconditioning and impaired sequencing with more complex tasks  Mobility Bed Mobility Bed Mobility: Rolling Right;Rolling Left;Sit to Supine;Supine to Sit Rolling Right: Independent with assistive device Rolling Left: Independent with assistive device Supine to Sit: Independent with assistive device Sit to Supine: Independent with assistive device Transfers Transfers: Sit to Stand;Stand to Sit;Stand Pivot Transfers Sit to Stand: Supervision/Verbal cueing Stand to Sit: Supervision/Verbal cueing Stand Pivot Transfers: Supervision/Verbal cueing Stand Pivot Transfer Details: Verbal cues for technique;Verbal cues for precautions/safety;Verbal cues for safe use of DME/AE Stand Pivot Transfer Details (indicate cue type and reason): verbal cues to push up with BUE support from chair armrests and for RW safety when turning Transfer (Assistive device): Rolling walker Locomotion  Gait Ambulation: Yes Gait Assistance: Supervision/Verbal cueing Gait Distance (Feet): 180 Feet Assistive device: Rolling walker Gait Assistance Details: Verbal cues for safe use of DME/AE Gait Assistance Details: verbal cues for RW safety Gait Gait: Yes Gait Pattern: Impaired Gait Pattern: Step-to pattern;Step-through pattern;Decreased step length -  right;Decreased step length - left;Poor foot clearance - right;Poor foot clearance - left;Narrow base of support;Trunk flexed Gait velocity: Decreased Stairs / Additional Locomotion Stairs: Yes Stairs Assistance: Contact Guard/Touching assist Stair Management Technique: Two rails Number of Stairs: 4 Height of Stairs: 6 Ramp: Supervision/Verbal cueing (RW) Naval architect Mobility: Yes Wheelchair Assistance: Independent with Scientist, research (life sciences): Both upper extremities Wheelchair Parts Management: Needs assistance  Distance: 160ft  Trunk/Postural Assessment  Cervical Assessment Cervical Assessment: Exceptions to [facility] (forward head) Thoracic Assessment Thoracic Assessment: Exceptions to [facility] (rounded shoulders) Lumbar Assessment Lumbar Assessment: Exceptions to [facility] (posterior pelvic tilt) Postural Control Postural Control: Deficits on evaluation Righting Reactions: Delayed secondary to global deconditioning  Balance Balance Balance Assessed: Yes Static Sitting Balance Static Sitting - Balance Support: Feet supported;Bilateral upper extremity supported Static Sitting - Level of Assistance: 7: Independent Dynamic Sitting Balance Dynamic Sitting - Balance Support: Feet supported;No upper extremity supported Dynamic Sitting - Level of Assistance: 6: Modified independent (Device/Increase time) Static Standing Balance Static Standing - Balance Support: Bilateral upper extremity supported;During functional activity (RW) Static Standing - Level of Assistance: 5: Stand by assistance (supervision) Dynamic Standing Balance Dynamic Standing - Balance Support: Bilateral upper extremity supported;During functional activity (RW) Dynamic Standing - Level of Assistance: 5: Stand by assistance (supervision) Dynamic Standing - Comments: with transfers and gait Extremity Assessment  RLE Assessment RLE Assessment: Exceptions to [facility] General Strength Comments: tested  sitting in WC RLE Strength Right Hip Flexion: 3+/5 Right Hip ABduction: 4-/5 Right Hip ADduction: 4-/5 Right Knee Flexion: 4-/5 Right Knee Extension: 4-/5 Right Ankle Dorsiflexion: 4/5 Right Ankle Plantar Flexion: 4/5 LLE Assessment LLE Assessment: Exceptions to [facility] General Strength Comments: tested sitting in WC LLE Strength Left Hip Flexion: 3+/5 Left Hip ABduction: 4-/5 Left Hip ADduction: 4-/5 Left Knee Flexion: 4-/5 Left Knee Extension: 4-/5 Left Ankle Dorsiflexion: 4/5 Left Ankle Plantar Flexion: 4/5    M Zaunegger Blima Rich PT, DPT 06/10/2023, 7:11 AM

## 2023-06-11 ENCOUNTER — Other Ambulatory Visit (HOSPITAL_COMMUNITY): Payer: Self-pay

## 2023-06-11 ENCOUNTER — Encounter (HOSPITAL_COMMUNITY): Payer: Self-pay | Admitting: Hematology and Oncology

## 2023-06-11 MED ORDER — PREDNISONE 20 MG PO TABS
20.0000 mg | ORAL_TABLET | Freq: Every day | ORAL | 0 refills | Status: DC
  Filled 2023-06-11: qty 30, 30d supply, fill #0

## 2023-06-11 MED ORDER — FUROSEMIDE 20 MG PO TABS: 20.0000 mg | ORAL_TABLET | Freq: Once | ORAL | Status: DC

## 2023-06-11 MED ORDER — ACETAMINOPHEN 325 MG PO TABS: 325.0000 mg | ORAL_TABLET | Freq: Four times a day (QID) | ORAL | Status: DC | PRN

## 2023-06-11 MED ORDER — MIRTAZAPINE 15 MG PO TABS
15.0000 mg | ORAL_TABLET | Freq: Every day | ORAL | 0 refills | Status: DC
  Filled 2023-06-11: qty 30, 30d supply, fill #0

## 2023-06-11 MED ORDER — MAGNESIUM OXIDE 400 MG PO TABS: 200.0000 mg | ORAL_TABLET | Freq: Every day | ORAL | Status: DC

## 2023-06-11 MED ORDER — PSYLLIUM 95 % PO PACK
1.0000 | PACK | Freq: Every day | ORAL | 0 refills | Status: DC
  Filled 2023-06-11: qty 240, 240d supply, fill #0

## 2023-06-11 MED ORDER — PREDNISONE 10 MG PO TABS
10.0000 mg | ORAL_TABLET | Freq: Every day | ORAL | 1 refills | Status: DC
  Filled 2023-06-11: qty 15, 15d supply, fill #0

## 2023-06-11 MED ORDER — ISOSORBIDE MONONITRATE ER 60 MG PO TB24
60.0000 mg | ORAL_TABLET | Freq: Every day | ORAL | 0 refills | Status: DC
  Filled 2023-06-11: qty 30, 30d supply, fill #0

## 2023-06-11 MED ORDER — SENNA 8.6 MG PO TABS
1.0000 | ORAL_TABLET | Freq: Every day | ORAL | 0 refills | Status: DC
  Filled 2023-06-11: qty 30, 30d supply, fill #0

## 2023-06-11 MED ORDER — MELATONIN 3 MG PO TABS
3.0000 mg | ORAL_TABLET | Freq: Every day | ORAL | 0 refills | Status: DC
  Filled 2023-06-11: qty 60, 60d supply, fill #0

## 2023-06-11 NOTE — Progress Notes (Signed)
Patient ID: Lisa [name], female   DOB: [birth year], 89 y.o.   MRN: [id number]  SW received a call from pt's grandson, Lisa [name]. Grandson still requesting Geriatric PCP (NO NP's) recommendation. Sw informed physician on 7/31 and informed physician that SW is unable to make a recommendation for this service. Grandson informed SW that physician informed him about a week ago that SW would arrange this service.  Grandson stated that to SW "you have not been looking". Sw informed grandson that SW did not have a recommendation. Family can establish a preference and once preference is established SW can assist with an appointment. Family will have to begin the process of signing medical records to be sent to new PCP from current PCP.  Grandson expressed SW is not helpful and did not look into this service. Sw unable to make recommendations and recommended grandson to look at potential providers on [employer]. Grandson proceeds to hang up. No additional questions or concerns.

## 2023-06-11 NOTE — Progress Notes (Signed)
PROGRESS NOTE   Subjective/Complaints: No new complaints this morning Sleepy Appreciate pharmacy med review Tolerated therapy well yesterday    ROS:  +thick green phelgm, +right knee pain  Objective:   DG Chest 2 View  Result Date: 06/09/2023 CLINICAL DATA:  Phlegm in throat.  Feeling drowsy. EXAM: CHEST - 2 VIEW COMPARISON:  X-ray 06/02/2023 FINDINGS: Enlarged cardiopericardial silhouette with vascular congestion. Trace interstitial edema. Kyphotic x-ray obscures the left lung apex. No obvious pneumothorax. Surgical clips along the left hemithorax. Elevated left hemidiaphragm. IMPRESSION: Enlarged heart with vascular congestion and trace edema. Rotated kyphotic x-ray obscures the left lung apex on the frontal view. Electronically Signed   By: Karen Kays M.D.   On: 06/09/2023 17:35   Recent Labs    06/09/23 1606  WBC 2.8*  HGB 8.2*  HCT 30.0*  PLT 69*     No results for input(s): "NA", "K", "CL", "CO2", "GLUCOSE", "BUN", "CREATININE", "CALCIUM" in the last 72 hours.   Intake/Output Summary (Last 24 hours) at 06/11/2023 0925 Last data filed at 06/11/2023 0841 Gross per 24 hour  Intake 480 ml  Output --  Net 480 ml        Physical Exam: Vital Signs Blood pressure (!) 173/95, pulse 98, temperature 97.7 F (36.5 C), resp. rate 15, height 5\' 3"  (1.6 m), weight 73.2 kg, SpO2 96%. Gen: no distress, normal appearing HEENT: oral mucosa pink and moist, NCAT Cardio: Reg rate Chest: normal effort, normal rate of breathing Abd: soft, non-distended Ext: no edema Psych: pleasant, normal affect  Skin: Dry without signs of breakdown Neuro: Alert and awake, follows commands,  Normal language and speech. Cranial nerve exam unremarkable. A little HOH. MMT: UE grossly 4 to 4+/5 prox to distal. LE 3+ prox to 4+/5 ADF/PF.  Sensation intact light touch in all 4 extremities. DTR's 1+. No abnl resting tone.   Musculoskeletal: chronic  DJD in hands, knees and feet. Minimal joint pain with ROM  Ambulating CG, right knee TTP Assessment/Plan: 1. Functional deficits which require 3+ hours per day of interdisciplinary therapy in a comprehensive inpatient rehab setting. Physiatrist is providing close team supervision and 24 hour management of active medical problems listed below. Physiatrist and rehab team continue to assess barriers to discharge/monitor patient progress toward functional and medical goals  Care Tool:  Bathing    Body parts bathed by patient: Right arm, Left arm, Chest, Abdomen, Front perineal area, Face, Right upper leg, Left upper leg, Right lower leg, Left lower leg, Buttocks   Body parts bathed by helper: Buttocks     Bathing assist Assist Level: Supervision/Verbal cueing     Upper Body Dressing/Undressing Upper body dressing   What is the patient wearing?: Pull over shirt    Upper body assist Assist Level: Supervision/Verbal cueing    Lower Body Dressing/Undressing Lower body dressing      What is the patient wearing?: Pants, Incontinence brief     Lower body assist Assist for lower body dressing: Supervision/Verbal cueing     Toileting Toileting    Toileting assist Assist for toileting: Contact Guard/Touching assist     Transfers Chair/bed transfer  Transfers assist     Chair/bed  transfer assist level: Supervision/Verbal cueing     Locomotion Ambulation   Ambulation assist      Assist level: Supervision/Verbal cueing Assistive device: Walker-rolling Max distance: 153ft   Walk 10 feet activity   Assist     Assist level: Supervision/Verbal cueing Assistive device: Walker-rolling   Walk 50 feet activity   Assist Walk 50 feet with 2 turns activity did not occur: Safety/medical concerns (Patient unable to ambulate >17' at this time secondary to global deconditioning and weakness)  Assist level: Supervision/Verbal cueing Assistive device: Walker-rolling     Walk 150 feet activity   Assist Walk 150 feet activity did not occur: Safety/medical concerns  Assist level: Supervision/Verbal cueing Assistive device: Walker-rolling    Walk 10 feet on uneven surface  activity   Assist     Assist level: Supervision/Verbal cueing Assistive device: Walker-rolling   Wheelchair     Assist Is the patient using a wheelchair?: Yes Type of Wheelchair: Manual    Wheelchair assist level: Independent Max wheelchair distance: 118ft    Wheelchair 50 feet with 2 turns activity    Assist        Assist Level: Independent   Wheelchair 150 feet activity     Assist      Assist Level: Independent   Blood pressure (!) 173/95, pulse 98, temperature 97.7 F (36.5 C), resp. rate 15, height 5\' 3"  (1.6 m), weight 73.2 kg, SpO2 96%.   Medical Problem List and Plan: 1. Functional deficits secondary to debility secondary to acute on chronic hypoxic and hypercapnic respiratory failure/COPD exacerbation/CAP/acute diastolic heart failure/acute delirium..  Patient uses supplemental oxygen at baseline at home of 2 L.             -patient may shower             -ELOS/Goals: 14 days, mod I to supervision goals with PT and OT  -Continue CIR  IPOC completed  Discussed with therapy that she tolerated therapy very well today without symptoms, can continue therapy and have reached out to cardiology for HR parameters  Updated grandson  Sent message to team to discuss progress, family ed, and whether she can d/c home earlier, discussed with patient that if her children are able to provide MinA we can sent her home earlier  Con't CIR PT and OT  Team conference 8/7 2.  Antithrombotics: -DVT/anticoagulation:  Mechanical: Antiembolism stockings, thigh (TED hose) Bilateral lower extremities             -antiplatelet therapy: N/A 3. Pain Management: Oxycodone 5 mg every 6 hours as needed moderate pain  -7/28 pain controlled, not using oxycodone  frequently  8/5- Oxy has been stopped 4. Mood/Behavior/Sleep: Remeron 15 mg nightly             -pt reports that she continues to struggle with sleep. We discussed melatonin                         -will begin 3mg  melatonin scheduled at bedtime             -antipsychotic agents: N/A 5. Neuropsych/cognition: This patient is capable of making decisions on her own behalf. 6. Skin/Wound Care: Routine skin checks 7. Fluids/Electrolytes/Nutrition: Routine and analysis with follow-up chemistries 8.  Hyper/hypokalemia.  Resolved with aggressive treatments.  Her Lokelma has since been discontinued.  Patient had resumed her home oral dose of Lasix. -Recheck labs Monday  8/5- K+ 3.9- doing well 9.  Symptomatic  chronic hyponatremia.  Nephrology follow-up.  Urine sodium 75 with 275 urine osmolality.  Plasma sodium normalizing -Follow-up chemistries, monitor intake -7/28 Recheck tomorrow  8/5- Na 136 10.  Acute HFpEF exacerbation .  Imdur 30 mg daily,  Lasix 20 mg daily, Toprol XL 25 mg daily.  Monitor for any signs of fluid overload.  Daily weights- weight reviewed and is slightly increased 8/8- additional lasix ordered   Filed Weights   06/09/23 0401 06/10/23 0500 06/11/23 0500  Weight: 70.9 kg 72.2 kg 73.2 kg    11.  Chronic orthostasis.  Florinef 0.1 mg daily.  Monitor with increased mobility 12.  MDS/CMML C/B neutropenia and thrombocytopenia.  Followed by hematology services Dr.Katragadda. 13.  Recent GI bleed.  Follow-up CBC on Monday - Protonix  daily for prophylaxis. 8/5- Hb 8.5- on 7/29- hasn't been checked since 14.  Recent admission 6/4 - 6/7 for non-STEMI.  Followed by cardiology services Dr.Mallipeddi 15.  PAF.  Not a candidate for anticoagulation due to history of GI bleed.             -8/6- BP slightly elevated this AM, but usually controlled- con't regimen    06/11/2023    5:00 AM 06/11/2023    3:29 AM 06/10/2023    8:11 PM  Vitals with BMI  Weight 161 lbs 6 oz    BMI 28.59     Systolic  173 174  Diastolic  95 98  Pulse  98 69    16.  Hypothyroidism.  Synthroid 17.  Hyperlipidemia.  Pravachol  18.  Urinary frequency.   -UA and reflex culture ordered, discussed that UA is negative, discussed that this is likely 2/2 to Lasix  19. Hypotension: resolved, continue lasix 20mg , discussed with grand son dosing lasix based on standing weights. BP reviewed and is stable, currently hypertensive, give additional lasix 8/8, give additional 20 og lasix on 8/9  20. Hypokalemia: klor ordered x2 days this admission, repeat Monday  21. Afib with RVR: cardiology consulted, continue lopressor, discussed her symptoms with cardiology, discussed that cardiology decided to maintain current metoprolol dose given her bradycardia, discussed that due to the afib she is at risk for hear rate spikes. Flowsheet reviewed and HR is currently stable, increase magnesium gluconate to 500mg  HS, continue  22. COPD: placed parameters for O2 to be titrated to goal of 88-92% or used prn for comfort, attempted wean to 1L but desatted to 86%  8/5- Wasn't wearing O2 this AM- put back on for pt.   8/6- pt needs O2- when drops from 2L to 1L, drops to 86%on 1L- asked PT to do documentation for need of O2.  23. Overweight: provide dietary education. Magnesium supplement started    24. History of magnesium deficiency: continue magnesium gluconate to 500mg  HS  25. Constipation: increase magnesium gluconate to 500mg  HS, d/c oxycodone as nod needing, will add senna HS. Last BM noted to be 8/5. Psyllium ordered  26. Green phlegm: CXR ordered, reviewed and shows no evidence of pneumonia, encouraged use of incentive spirometer  27.Trace edema on CXR: give additional 10mg  lasix on 8/8, give additional 20 of Lasix on 8/9   >30 minutes spent in discharge of patient including review of medications and follow-up appointments, physical examination, and in answering all patient's questions    LOS: 14  days A FACE TO FACE EVALUATION WAS PERFORMED   P  06/11/2023, 9:25 AM

## 2023-06-11 NOTE — Progress Notes (Signed)
Orthopedic Tech Progress Note Patient Details:  Lisa [name] 1934/02/18 [id number]  Ortho Devices Type of Ortho Device: Knee Sleeve Ortho Device/Splint Location: RLE Ortho Device/Splint Interventions: Ordered     Attempted to apply knee sleeve to patient, however the provider in the room asked [place] to step out and come back later to apply it. Knee sleeve left in room and RN notified. [place] A  06/11/2023, 11:05 AM

## 2023-06-15 ENCOUNTER — Ambulatory Visit: Payer: Medicare Other | Admitting: Internal Medicine

## 2023-06-23 ENCOUNTER — Encounter: Payer: Self-pay | Admitting: Physician Assistant

## 2023-06-23 NOTE — Progress Notes (Unsigned)
Cardiology Office Note    Date:  06/24/2023  ID:  Lisa [name], DOB 1934-06-16, MRN [id number] PCP:  [name], MD  Cardiologist:  Marjo Bicker, MD  Electrophysiologist:  None   Chief Complaint: f/u AF and multiple cardiac issues  History of Present Illness: Marland Kitchen    Lisa [name] is a 89 y.o. female with visit-pertinent history of paroxysmal afib (dx 2021, not felt to be a candidate for [facility]), TIA 2023, aortic stenosis, mitral regurgitation, COPD c/b chronic hypoxic respiratory failure on 2L baseline, chronic HFpEF, severe pHTN, CKD3b,  MDS/CMML c/b chronic neutropenia and thrombocytopenia, GI bleeding, NSTEMI managed medically, 4.8cm AAA in 11/2022 not felt to be surgical candidate (followed by [facility]), hyperkalemia, breast CA s/p lumpectomy/radiation, lung CA s/p LULectomy, hypothyroidism, hearing loss seen for follow-up.   She was dx with afib in 2021 initially with an isolated episode during medical admission with AKI, hyponatremia, and H Pylori. She was not felt to be a candidate for anticoagulation due to her anemia/thrombocytopenia and MDS. She has had a tumultous course lately. She was admitted 04/2023 with generalized weakness, SOB, confusion, decreased oral intake. She was found to have ABL anemia with melena and + FOBT, mild COPD exacerbation, hyponatremia, and mildly elevated troponin/possible NSTEMI. From cardiac standpoint she was managed medically. GI recommended empiric PPI/transfusion and held off EGD. She was readmitted 7/18-7/26/24 then subsequently CIR through 06/11/23 due to progressive weakness, increased confusion, decreased appeitite and increased O2 requirement. She was found to have a/c respiratory failure secondary to CAP, AECOPD, a/c HFpEF, recurrently elevated troponin to the 300 range, and hyperkalemia requiring Loklema and fludrocortisone. (Per nephrology note 05/2023, "almost seems like an aldosterone issue but cortisol is OK so not adrenal insufficiency. Will try a  little fludrocortisone and also inc lokelma to BID." Our team also saw her for recurrence of afib, managed with beta blocker therapy. Last echo 05/20/23 showed EF 60-65%, mod LVH, severely elevated PASP, mild MR, moderate AS though echo prior to that suggestive of possibly moderate-severe AS. In Dr. Verna Czech note 05/2023 he commented, "Overall poor prognosis given multiple advanced comorbidities. From cardiac standpoint moderate to severe AS much more favoring severe end of spectrum and severe pulmonary HTN not a candidate for additional testing of procedures as outlined above."  She is seen for follow-up today with her daughter. She arrived without her home O2 so was placed on 2L here with normal O2 sats. She uses 2L at home. She overall feels she's been doing well and offers no complaints. No CP, SOB, palpitations, edema, syncope. Her daughter's son is a physician in [place]. [name] has many other follow-ups scheduled with PCP, heme-onc, kidney, and pulmonary coming up.  Labwork independently reviewed: 7-06/2023 CBC deranged with Hgb 8.2, plt 69, WBC 2.8, K 3.9, Cr 1.0, alb 3.1, AST ALT OK, Mg 1.7, peak trop 318, TSH wnl  ROS: .    Please see the history of present illness.  All other systems are reviewed and otherwise negative.  Studies Reviewed: Marland Kitchen    EKG:  EKG is ordered today, personally reviewed, demonstrating NSR 71bpm with nonspecific STTW changes - TWI III, avF, V5-V6, early ST upsloping I, avL, V2- similar to 04/2023  CV Studies: Cardiac studies reviewed are outlined and summarized above. Otherwise please see EMR for full report.   Current Reported Medications:.    Current Meds  Medication Sig   acetaminophen (TYLENOL) 325 MG tablet Take 1 tablet (325 mg total) by mouth every 6 (six)  hours as needed for mild pain (or Fever >/= 101).   albuterol (VENTOLIN HFA) 108 (90 Base) MCG/ACT inhaler Inhale 1-2 puffs into the lungs every 4 (four) hours as needed for wheezing or shortness of breath.    Fluticasone-Umeclidin-Vilant (TRELEGY ELLIPTA) 100-62.5-25 MCG/ACT AEPB Inhale 1 puff into the lungs daily.   furosemide (LASIX) 20 MG tablet Take 1 tablet (20 mg total) by mouth daily.   isosorbide mononitrate (IMDUR) 60 MG 24 hr tablet Take 1 tablet (60 mg total) by mouth daily.   levothyroxine (SYNTHROID) 100 MCG tablet Take 1 tablet (100 mcg total) by mouth daily before breakfast.   magnesium oxide (MAG-OX) 400 MG tablet Take 0.5 tablets (200 mg total) by mouth at bedtime.   melatonin 3 MG TABS tablet Take 1 tablet (3 mg total) by mouth at bedtime.   metoprolol succinate (TOPROL-XL) 25 MG 24 hr tablet TAKE 1 TABLET DAILY   mirtazapine (REMERON) 15 MG tablet Take 1 tablet (15 mg total) by mouth at bedtime.   Multiple Vitamin (MULTI-VITAMIN) tablet Take 1 tablet by mouth daily.   pantoprazole (PROTONIX) 40 MG tablet Take 1 tablet (40 mg total) by mouth 2 (two) times daily.   pravastatin (PRAVACHOL) 10 MG tablet Take 1 tablet (10 mg total) by mouth every evening.   predniSONE (DELTASONE) 10 MG tablet Take 1 tablet (10 mg total) by mouth daily with breakfast.   psyllium (HYDROCIL/METAMUCIL) 95 % PACK Take 1 packet by mouth daily.   senna (SENOKOT) 8.6 MG TABS tablet Take 1 tablet (8.6 mg total) by mouth at bedtime.    Physical Exam:    VS:  BP 118/62   Pulse 73   Ht 5\' 3"  (1.6 m)   Wt 146 lb (66.2 kg)   SpO2 94% Comment: on oxygen 2 L [place]  BMI 25.86 kg/m    Wt Readings from Last 3 Encounters:  06/24/23 146 lb (66.2 kg)  06/11/23 161 lb 6 oz (73.2 kg)  05/23/23 166 lb 3.6 oz (75.4 kg)    GEN: Well nourished, well developed in no acute distress NECK: No JVD; No carotid bruits CARDIAC: RRR, 2/6 SEM, no murmurs, rubs, gallops RESPIRATORY:  Clear to auscultation without rales, wheezing or rhonchi  ABDOMEN: Soft, non-tender, non-distended EXTREMITIES:  No edema; No acute deformity  NEURO: Mildly HOH  Asessement and Plan:.    1. Chronic HFpEF with management c/b h/o hyperkalemia -  appears euvolemic on examination today. Though weight is down here from hospitalization, daughter reports home weights have been stable. Would continue furosemide 20mg  daily. Required Loklema + fludrocortisone while inpatient, not discharged on these. Will recheck BMET/CBC today to see where they have settled post-hospitalization. She does not appear to be a great candidate for SGLT2i given recent infection issues and hyperkalemia.  2. Severe pHTN - this has been suspected to be group II and II pulmonary HTN with recommendation to treat cardiac and lung conditions that are contributing, without pursuit of advanced testing given comorbidities and known etiologies that would not benefit from pulmonary vasodilators.  3. Moderate AS (possibly towards severe end of spectrum), mild MR - she has not been felt to be a candidate for valve surgery due to myelodysplastic syndrome with chronic anemia and thrombocytopenia, advanced age, poor functional capacity. Continued conservative, symptom-based approach is appropriate.  4. NSTEMI - no recent angina. Continue metoprolol, isosorbide. Not on any antiplatelets/anticoags due to MDS and concern for recent GI bleeding. Continue surveillance for any worsening symptoms.  5. Paroxysmal atrial fib -  maintaining NSR on metoprolol, would continue. Previously not felt to be a candidate for [facility] or [facility] due to concerns for MDS and GI bleeding. If she develops symptomatic recurrence in the future, could consider amiodarone.    Disposition: F/u with Dr. Jenene Slicker in 4 months.  Signed, Laurann Montana, PA-C

## 2023-06-24 ENCOUNTER — Ambulatory Visit (INDEPENDENT_AMBULATORY_CARE_PROVIDER_SITE_OTHER): Payer: Medicare Other | Admitting: Physician Assistant

## 2023-06-24 ENCOUNTER — Encounter: Payer: Self-pay | Admitting: Physician Assistant

## 2023-06-24 ENCOUNTER — Other Ambulatory Visit (HOSPITAL_COMMUNITY)
Admission: RE | Admit: 2023-06-24 | Discharge: 2023-06-24 | Disposition: A | Discharge status: Home / Self Care | Payer: Medicare Other | Source: Ambulatory Visit | Attending: Physician Assistant | Admitting: Physician Assistant

## 2023-06-24 VITALS — BP 118/62 | HR 73 | Ht 63.0 in | Wt 146.0 lb

## 2023-06-24 DIAGNOSIS — I480 Paroxysmal atrial fibrillation: Secondary | ICD-10-CM | POA: Insufficient documentation

## 2023-06-24 DIAGNOSIS — I350 Nonrheumatic aortic (valve) stenosis: Secondary | ICD-10-CM

## 2023-06-24 DIAGNOSIS — I252 Old myocardial infarction: Secondary | ICD-10-CM | POA: Diagnosis not present

## 2023-06-24 DIAGNOSIS — I5032 Chronic diastolic (congestive) heart failure: Secondary | ICD-10-CM | POA: Diagnosis not present

## 2023-06-24 DIAGNOSIS — I340 Nonrheumatic mitral (valve) insufficiency: Secondary | ICD-10-CM | POA: Diagnosis not present

## 2023-06-24 DIAGNOSIS — I4819 Other persistent atrial fibrillation: Secondary | ICD-10-CM

## 2023-06-24 DIAGNOSIS — I272 Pulmonary hypertension, unspecified: Secondary | ICD-10-CM

## 2023-06-24 LAB — CBC
HCT: 33.3 % — ABNORMAL LOW (ref 36.0–46.0)
Hemoglobin: 9.6 g/dL — ABNORMAL LOW (ref 12.0–15.0)
MCH: 30.7 pg (ref 26.0–34.0)
MCHC: 28.8 g/dL — ABNORMAL LOW (ref 30.0–36.0)
MCV: 106.4 fL — ABNORMAL HIGH (ref 80.0–100.0)
RBC: 3.13 MIL/uL — ABNORMAL LOW (ref 3.87–5.11)
RDW: 19.9 % — ABNORMAL HIGH (ref 11.5–15.5)
WBC: 1.7 10*3/uL — ABNORMAL LOW (ref 4.0–10.5)
nRBC: 1.8 % — ABNORMAL HIGH (ref 0.0–0.2)

## 2023-06-24 LAB — BASIC METABOLIC PANEL
Anion gap: 9 (ref 5–15)
BUN: 19 mg/dL (ref 8–23)
CO2: 27 mmol/L (ref 22–32)
Calcium: 9.3 mg/dL (ref 8.9–10.3)
Chloride: 102 mmol/L (ref 98–111)
Creatinine, Ser: 0.92 mg/dL (ref 0.44–1.00)
GFR, Estimated: >60 mL/min (ref >60)
Glucose, Bld: 133 mg/dL — ABNORMAL HIGH (ref 70–99)
Potassium: 4.4 mmol/L (ref 3.5–5.1)
Sodium: 138 mmol/L (ref 135–145)

## 2023-06-24 NOTE — Patient Instructions (Addendum)
Medication Instructions:  Your physician recommends that you continue on your current medications as directed. Please refer to the Current Medication list given to you today.  *If you need a refill on your cardiac medications before your next appointment, please call your pharmacy*   Lab Work: Your physician recommends that you return for lab work in: Today    If you have labs (blood work) drawn today and your tests are completely normal, you will receive your results only by: MyChart Message (if you have MyChart) OR A paper copy in the mail If you have any lab test that is abnormal or we need to change your treatment, we will call you to review the results.   Testing/Procedures: NONE    Follow-Up: At [facility], you and your health needs are our priority.  As part of our continuing mission to provide you with exceptional heart care, we have created designated Provider Care Teams.  These Care Teams include your primary Cardiologist (physician) and Advanced Practice Providers (APPs -  Physician Assistants and Nurse Practitioners) who all work together to provide you with the care you need, when you need it.  We recommend signing up for the patient portal called "MyChart".  Sign up information is provided on this After Visit Summary.  MyChart is used to connect with patients for Virtual Visits (Telemedicine).  Patients are able to view lab/test results, encounter notes, upcoming appointments, etc.  Non-urgent messages can be sent to your provider as well.   To learn more about what you can do with MyChart, go to [email or web address].    Your next appointment:   4 month(s)  Provider:   You may see Vishnu P Mallipeddi, MD or one of the following Advanced Practice Providers on your designated Care Team:   Randall An, PA-C  Jacolyn Reedy, PA-C     Other Instructions Thank you for choosing [facility]!

## 2023-06-29 DIAGNOSIS — I252 Old myocardial infarction: Secondary | ICD-10-CM | POA: Diagnosis not present

## 2023-06-29 DIAGNOSIS — E039 Hypothyroidism, unspecified: Secondary | ICD-10-CM | POA: Diagnosis not present

## 2023-06-29 DIAGNOSIS — Z7952 Long term (current) use of systemic steroids: Secondary | ICD-10-CM | POA: Diagnosis not present

## 2023-06-29 DIAGNOSIS — G4700 Insomnia, unspecified: Secondary | ICD-10-CM | POA: Diagnosis not present

## 2023-06-29 DIAGNOSIS — I5031 Acute diastolic (congestive) heart failure: Secondary | ICD-10-CM | POA: Diagnosis not present

## 2023-06-29 DIAGNOSIS — D630 Anemia in neoplastic disease: Secondary | ICD-10-CM | POA: Diagnosis not present

## 2023-06-29 DIAGNOSIS — E785 Hyperlipidemia, unspecified: Secondary | ICD-10-CM | POA: Diagnosis not present

## 2023-06-29 DIAGNOSIS — D696 Thrombocytopenia, unspecified: Secondary | ICD-10-CM | POA: Diagnosis not present

## 2023-06-29 DIAGNOSIS — Z7951 Long term (current) use of inhaled steroids: Secondary | ICD-10-CM | POA: Diagnosis not present

## 2023-06-29 DIAGNOSIS — J441 Chronic obstructive pulmonary disease with (acute) exacerbation: Secondary | ICD-10-CM | POA: Diagnosis not present

## 2023-06-29 DIAGNOSIS — Z9981 Dependence on supplemental oxygen: Secondary | ICD-10-CM | POA: Diagnosis not present

## 2023-06-29 DIAGNOSIS — J9621 Acute and chronic respiratory failure with hypoxia: Secondary | ICD-10-CM | POA: Diagnosis not present

## 2023-06-29 DIAGNOSIS — J9622 Acute and chronic respiratory failure with hypercapnia: Secondary | ICD-10-CM | POA: Diagnosis not present

## 2023-06-29 DIAGNOSIS — I480 Paroxysmal atrial fibrillation: Secondary | ICD-10-CM | POA: Diagnosis not present

## 2023-06-29 DIAGNOSIS — C9310 Chronic myelomonocytic leukemia not having achieved remission: Secondary | ICD-10-CM | POA: Diagnosis not present

## 2023-06-29 DIAGNOSIS — Z8701 Personal history of pneumonia (recurrent): Secondary | ICD-10-CM | POA: Diagnosis not present

## 2023-06-29 DIAGNOSIS — Z9181 History of falling: Secondary | ICD-10-CM | POA: Diagnosis not present

## 2023-06-30 DIAGNOSIS — D469 Myelodysplastic syndrome, unspecified: Secondary | ICD-10-CM | POA: Diagnosis not present

## 2023-06-30 DIAGNOSIS — R809 Proteinuria, unspecified: Secondary | ICD-10-CM | POA: Diagnosis not present

## 2023-06-30 DIAGNOSIS — N182 Chronic kidney disease, stage 2 (mild): Secondary | ICD-10-CM | POA: Diagnosis not present

## 2023-06-30 DIAGNOSIS — I129 Hypertensive chronic kidney disease with stage 1 through stage 4 chronic kidney disease, or unspecified chronic kidney disease: Secondary | ICD-10-CM | POA: Diagnosis not present

## 2023-07-02 DIAGNOSIS — I5031 Acute diastolic (congestive) heart failure: Secondary | ICD-10-CM | POA: Diagnosis not present

## 2023-07-02 DIAGNOSIS — C9310 Chronic myelomonocytic leukemia not having achieved remission: Secondary | ICD-10-CM | POA: Diagnosis not present

## 2023-07-02 DIAGNOSIS — D630 Anemia in neoplastic disease: Secondary | ICD-10-CM | POA: Diagnosis not present

## 2023-07-02 DIAGNOSIS — J9622 Acute and chronic respiratory failure with hypercapnia: Secondary | ICD-10-CM | POA: Diagnosis not present

## 2023-07-02 DIAGNOSIS — J441 Chronic obstructive pulmonary disease with (acute) exacerbation: Secondary | ICD-10-CM | POA: Diagnosis not present

## 2023-07-02 DIAGNOSIS — J9621 Acute and chronic respiratory failure with hypoxia: Secondary | ICD-10-CM | POA: Diagnosis not present

## 2023-07-05 NOTE — Progress Notes (Signed)
[facility] [address], [place] [address]    Clinic Day:  07/05/2023  Referring physician: [name], MD  Patient Care Team: [name], MD as PCP - General (Internal Medicine) Mallipeddi, Orion Modest, MD as PCP - Cardiology (Cardiology) Jena Gauss Gerrit Friends, MD as Consulting Physician (Gastroenterology)   ASSESSMENT & PLAN:   Assessment: 1.    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 Margo Aye for pancytopenia. Subsequently her white count has recovered along with recovery of the hemoglobin. -Bone marrow biopsy on 11/06/2020 shows hypercellular marrow with trilineage dyspoiesis and polytypic plasmacytosis.  Primary differential includes CMML and other MDS.  Definitive blast population is not identified morphologically or by flow.  There is a increased immature mononuclear cells favored to be monocytic in origin.  Megakaryocytes are frequently hypolobated and hyperchromatic without clustering.  Atypical appearing erythroid precursors.  Increased plasma cells 5 to 9% which are polytypic by kappa and lambda staining. - Chromosome analysis shows 46,X, idiocentric X chromosome[9]/46, XX[11]. -MDS FISH panel was negative. -Revised IPSS score of 3.  In the absence of treatment, median survival of 5.3 years and 25% AML progression is 10.8 years. -SPEP, methylmalonic acid, ferritin levels were normal. - Serum copper level is 87. - NGS (11/19/2020): SRSF2, TET2   2.  Stage I left breast cancer: -Diagnosed in Sep 1999, treated with lumpectomy and XRT. ER/PR was positive and received tamoxifen for 5 years. -Mammogram on 2019-10-30 was BI-RADS Category 2. Physical exam did not reveal any palpable masses today.   3.  Stage I adenocarcinoma of the left upper lobe of the lung: -Left upper lobectomy on 2005-06-01. CT chest on 2018-06-09 was stable. - CT CAP on 10/24/2020 shows stable postsurgical changes from the partial left upper lobe resection.  No other signs of  malignancy.    Plan: 1.   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 - Denies any bleeding per rectum.  However she reports occasional dark stools after recent diarrhea for the last 7 to 10 days. - Reviewed labs today: Ferritin was 533, percent saturation 19.  Hemoglobin is low at 8.3.  She was in the range of 9-10 for the past few months.  Platelet count also dropped to 44 and has been gradually decreasing.  LDH also increased to 287. - Based on these findings, I have recommended repeating bone marrow aspiration and biopsy with MDS FISH panel and cytogenetics. - Will send myeloid NGS panel and SPEP today. - She may receive Aranesp today.  I will see her back 2 weeks after the biopsy.  2.  Hyperkalemia: - She was started on Lokelma 3 months ago by Dr. Wolfgang Phoenix.  Potassium is 4.5.    No orders of the defined types were placed in this encounter.     Alben Deeds Teague,acting as a Neurosurgeon for Doreatha Massed, MD.,have documented all relevant documentation on the behalf of Doreatha Massed, MD,as directed by  Doreatha Massed, MD while in the presence of Doreatha Massed, MD.  ***   [place] R Teague   9/2/202411:00 PM  CHIEF COMPLAINT:   Diagnosis: MDS   Cancer Staging  Invasive ductal carcinoma of left breast [facility]) Staging form: Breast, AJCC 7th Edition - Pathologic stage from 07/03/1998: Stage IA (T1a, N0, cM0) - Signed by Ellouise Newer, PA-C on 06/09/2016    Prior Therapy: None  Current Therapy:  Aransesp    HISTORY OF PRESENT ILLNESS:  Oncology History Overview Note  Called Dr Scharlene Gloss office again now, office closed. Pt will see Dr [name] and I will ask him if he can talk to Dr Margo Aye after his visit.  Thanks,   Adenocarcinoma of left lung (HCC)  06/23/2016 Imaging   CT chest- Stable exam.  No new or progressive findings. Previously described tiny perifissural nodules at the base of the right major fissure are stable and likely  represent subpleural lymph nodes.      INTERVAL HISTORY:   Lisa [name] is a 89 y.o. female presenting to clinic today for follow up of MDS. She was last seen by me on 04/01/23.  Since her last visit, she was admitted to the ED on 04/06/23 and 05/28/23 for NSTEMI. She presented to the ED on 04/14/23 for a fall and was admitted on 05/20/23 for pneumonia.   She had a bone marrow biopsy on 04/2723. Surgical pathology revealed: small blast population and increased monocyte population. Cytogenetics revealed an abnormal isodicentric X chromosome. FISH results were normal.   Today, she states that she is doing well overall. Her appetite level is at ***%. Her energy level is at ***%.  PAST MEDICAL HISTORY:   Past Medical History: Past Medical History:  Diagnosis Date   AAA (abdominal aortic aneurysm) (HCC)    Adenocarcinoma of left lung (HCC) 2006   AF (paroxysmal atrial fibrillation) (HCC)    Aortic stenosis    Arthritis    Asthma    Cancer of breast, female (HCC)    Cancer of lung (HCC)    Dysrhythmia    Hypertension    Hypothyroidism    Invasive ductal carcinoma of left breast (HCC) 1999   Mitral regurgitation    Myocardial infarction (HCC)    Neutropenia (HCC) 06/09/2016   Personal history of radiation therapy    Pulmonary hypertension (HCC)     Surgical History: Past Surgical History:  Procedure Laterality Date   BIOPSY  10/23/2020   Procedure: BIOPSY;  Surgeon: Benancio Deeds, MD;  Location: [facility] ENDOSCOPY;  Service: Gastroenterology;;   BREAST BIOPSY     left axillary node dissection   BREAST LUMPECTOMY Left    COLONOSCOPY  03/08/2003   EXB:[id number] rectal polyps destroyed with the tip of the snare/Polyps at hepatic flexure, splenic flexure at 35 cm/Left-sided diverticula: unable to retrieve path   COLONOSCOPY  09/12/2008   UUV:[id number] rectum and distal sigmoid diminutive polyps/scattered left sided diverticulum. hyperplastic   COLONOSCOPY N/A 03/06/2013   QIH:[id number]  polyp-removed as described above; colonic diverticulosis. hyperplastic polyps. next TCS 03/2018   ESOPHAGOGASTRODUODENOSCOPY (EGD) WITH PROPOFOL N/A 10/23/2020   Procedure: ESOPHAGOGASTRODUODENOSCOPY (EGD) WITH PROPOFOL;  Surgeon: Benancio Deeds, MD;  Location: [facility] ENDOSCOPY;  Service: Gastroenterology;  Laterality: N/A;   FOOT SURGERY     LUNG REMOVAL, PARTIAL     upper lobe    Social History: Social History   Socioeconomic History   Marital status: Widowed    Spouse name: Not on file   Number of children: Not on file   Years of education: Not on file   Highest education level: Not on file  Occupational History   Not on file  Tobacco Use   Smoking status: Former    Current packs/day: 0.00    Average packs/day: 0.8 packs/day for 35.0 years (26.3 ttl pk-yrs)    Types: Cigarettes    Start date: 65    Quit date: 79    Years since quitting: 31.6   Smokeless tobacco: Never  Tobacco comments:    smoke-free X 30 yeras  Vaping Use   Vaping status: Never Used  Substance and Sexual Activity   Alcohol use: No   Drug use: No   Sexual activity: Not Currently  Other Topics Concern   Not on file  Social History Narrative   Not on file   Social Determinants of Health   Financial Resource Strain: Not on file  Food Insecurity: No Food Insecurity (05/20/2023)   Hunger Vital Sign    Worried About Running Out of Food in the Last Year: Never true    Ran Out of Food in the Last Year: Never true  Transportation Needs: No Transportation Needs (05/21/2023)   PRAPARE - Administrator, Civil Service (Medical): No    Lack of Transportation (Non-Medical): No  Physical Activity: Inactive (12/12/2020)   Exercise Vital Sign    Days of Exercise per Week: 0 days    Minutes of Exercise per Session: 0 min  Stress: Not on file  Social Connections: Not on file  Intimate Partner Violence: Not At Risk (05/20/2023)   Humiliation, Afraid, Rape, and Kick questionnaire    Fear of  Current or Ex-Partner: No    Emotionally Abused: No    Physically Abused: No    Sexually Abused: No    Family History: Family History  Problem Relation Age of Onset   Cancer Mother    Cancer Sister    Colon cancer Neg Hx     Current Medications:  Current Outpatient Medications:    acetaminophen (TYLENOL) 325 MG tablet, Take 1 tablet (325 mg total) by mouth every 6 (six) hours as needed for mild pain (or Fever >/= 101)., Disp: , Rfl:    albuterol (VENTOLIN HFA) 108 (90 Base) MCG/ACT inhaler, Inhale 1-2 puffs into the lungs every 4 (four) hours as needed for wheezing or shortness of breath., Disp: , Rfl:    Fluticasone-Umeclidin-Vilant (TRELEGY ELLIPTA) 100-62.5-25 MCG/ACT AEPB, Inhale 1 puff into the lungs daily., Disp: 28 each, Rfl: 0   furosemide (LASIX) 20 MG tablet, Take 1 tablet (20 mg total) by mouth daily., Disp: 30 tablet, Rfl: 1   isosorbide mononitrate (IMDUR) 60 MG 24 hr tablet, Take 1 tablet (60 mg total) by mouth daily., Disp: 30 tablet, Rfl: 0   levothyroxine (SYNTHROID) 100 MCG tablet, Take 1 tablet (100 mcg total) by mouth daily before breakfast., Disp: 30 tablet, Rfl: 1   magnesium oxide (MAG-OX) 400 MG tablet, Take 0.5 tablets (200 mg total) by mouth at bedtime., Disp: , Rfl:    melatonin 3 MG TABS tablet, Take 1 tablet (3 mg total) by mouth at bedtime., Disp: 60 tablet, Rfl: 0   metoprolol succinate (TOPROL-XL) 25 MG 24 hr tablet, TAKE 1 TABLET DAILY, Disp: 90 tablet, Rfl: 1   mirtazapine (REMERON) 15 MG tablet, Take 1 tablet (15 mg total) by mouth at bedtime., Disp: 30 tablet, Rfl: 0   Multiple Vitamin (MULTI-VITAMIN) tablet, Take 1 tablet by mouth daily., Disp: , Rfl:    pantoprazole (PROTONIX) 40 MG tablet, Take 1 tablet (40 mg total) by mouth 2 (two) times daily., Disp: 60 tablet, Rfl: 1   pravastatin (PRAVACHOL) 10 MG tablet, Take 1 tablet (10 mg total) by mouth every evening., Disp: 30 tablet, Rfl: 1   predniSONE (DELTASONE) 10 MG tablet, Take 1 tablet (10 mg  total) by mouth daily with breakfast., Disp: 15 tablet, Rfl: 1   psyllium (HYDROCIL/METAMUCIL) 95 % PACK, Take 1 packet by mouth daily.,  Disp: 240 each, Rfl: 0   senna (SENOKOT) 8.6 MG TABS tablet, Take 1 tablet (8.6 mg total) by mouth at bedtime., Disp: 30 tablet, Rfl: 0   Allergies: Allergies  Allergen Reactions   Meloxicam Other (See Comments)    Caused an injury to the kidneys, per nephrologist   Micardis Hct [Telmisartan-Hctz] Other (See Comments)    Caused an injury to the kidneys, per nephrologist    REVIEW OF SYSTEMS:   Review of Systems  Constitutional:  Negative for chills, fatigue and fever.  HENT:   Negative for lump/mass, mouth sores, nosebleeds, sore throat and trouble swallowing.   Eyes:  Negative for eye problems.  Respiratory:  Negative for cough and shortness of breath.   Cardiovascular:  Negative for chest pain, leg swelling and palpitations.  Gastrointestinal:  Negative for abdominal pain, constipation, diarrhea, nausea and vomiting.  Genitourinary:  Negative for bladder incontinence, difficulty urinating, dysuria, frequency, hematuria and nocturia.   Musculoskeletal:  Negative for arthralgias, back pain, flank pain, myalgias and neck pain.  Skin:  Negative for itching and rash.  Neurological:  Negative for dizziness, headaches and numbness.  Hematological:  Does not bruise/bleed easily.  Psychiatric/Behavioral:  Negative for depression, sleep disturbance and suicidal ideas. The patient is not nervous/anxious.   All other systems reviewed and are negative.    VITALS:   There were no vitals taken for this visit.  Wt Readings from Last 3 Encounters:  06/24/23 146 lb (66.2 kg)  06/11/23 161 lb 6 oz (73.2 kg)  05/23/23 166 lb 3.6 oz (75.4 kg)    There is no height or weight on file to calculate BMI.  Performance status (ECOG): 1 - Symptomatic but completely ambulatory  PHYSICAL EXAM:   Physical Exam Vitals and nursing note reviewed. Exam conducted with a  chaperone present.  Constitutional:      Appearance: Normal appearance.  Cardiovascular:     Rate and Rhythm: Normal rate and regular rhythm.     Pulses: Normal pulses.     Heart sounds: Normal heart sounds.  Pulmonary:     Effort: Pulmonary effort is normal.     Breath sounds: Normal breath sounds.  Abdominal:     Palpations: Abdomen is soft. There is no hepatomegaly, splenomegaly or mass.     Tenderness: There is no abdominal tenderness.  Musculoskeletal:     Right lower leg: No edema.     Left lower leg: No edema.  Lymphadenopathy:     Cervical: No cervical adenopathy.     Right cervical: No superficial, deep or posterior cervical adenopathy.    Left cervical: No superficial, deep or posterior cervical adenopathy.     Upper Body:     Right upper body: No supraclavicular or axillary adenopathy.     Left upper body: No supraclavicular or axillary adenopathy.  Neurological:     General: No focal deficit present.     Mental Status: She is alert and oriented to person, place, and time.  Psychiatric:        Mood and Affect: Mood normal.        Behavior: Behavior normal.     LABS:      Latest Ref Rng & Units 06/24/2023    2:30 PM 06/09/2023    4:06 PM 05/31/2023    7:35 AM  CBC  WBC 4.0 - 10.5 K/uL 1.7  2.8  5.4   Hemoglobin 12.0 - 15.0 g/dL 9.6  8.2  8.5   Hematocrit 36.0 - 46.0 %  33.3  30.0  29.7   Platelets 150 - 400 K/uL DCLMP  69  65       Latest Ref Rng & Units 06/24/2023    2:30 PM 06/03/2023   10:02 AM 06/01/2023   10:59 AM  CMP  Glucose 70 - 99 mg/dL 469  629  528   BUN 8 - 23 mg/dL 19  22  19    Creatinine 0.44 - 1.00 mg/dL 4.13  2.44  0.10   Sodium 135 - 145 mmol/L 138  136  135   Potassium 3.5 - 5.1 mmol/L 4.4  3.9  3.6   Chloride 98 - 111 mmol/L 102  96  93   CO2 22 - 32 mmol/L 27  30  32   Calcium 8.9 - 10.3 mg/dL 9.3  8.5  8.6      No results found for: "CEA1", "CEA" / No results found for: "CEA1", "CEA" No results found for: "PSA1" No results found  for: "CAN199" No results found for: "CAN125"  Lab Results  Component Value Date   TOTALPROTELP 9.0 (H) 04/01/2023   ALBUMINELP 3.5 04/01/2023   A1GS 0.3 04/01/2023   A2GS 0.6 04/01/2023   BETS 1.2 04/01/2023   GAMS 3.5 (H) 04/01/2023   MSPIKE Not Observed 04/01/2023   SPEI Comment 04/01/2023   Lab Results  Component Value Date   TIBC 318 04/01/2023   TIBC 316 02/12/2023   TIBC 311 12/17/2022   FERRITIN 533 (H) 04/01/2023   FERRITIN 360 (H) 02/12/2023   FERRITIN 341 (H) 12/17/2022   IRONPCTSAT 19 04/01/2023   IRONPCTSAT 18 02/12/2023   IRONPCTSAT 20 12/17/2022   Lab Results  Component Value Date   LDH 287 (H) 04/01/2023   LDH 264 (H) 02/12/2023   LDH 314 (H) 11/03/2022     STUDIES:   DG Chest 2 View  Result Date: 06/09/2023 CLINICAL DATA:  Phlegm in throat.  Feeling drowsy. EXAM: CHEST - 2 VIEW COMPARISON:  X-ray 06/02/2023 FINDINGS: Enlarged cardiopericardial silhouette with vascular congestion. Trace interstitial edema. Kyphotic x-ray obscures the left lung apex. No obvious pneumothorax. Surgical clips along the left hemithorax. Elevated left hemidiaphragm. IMPRESSION: Enlarged heart with vascular congestion and trace edema. Rotated kyphotic x-ray obscures the left lung apex on the frontal view. Electronically Signed   By: Karen Kays M.D.   On: 06/09/2023 17:35

## 2023-07-06 ENCOUNTER — Inpatient Hospital Stay: Payer: Medicare Other | Attending: Hematology | Admitting: Hematology

## 2023-07-06 ENCOUNTER — Inpatient Hospital Stay: Payer: Medicare Other

## 2023-07-06 VITALS — BP 130/77 | HR 65 | Temp 97.9°F | Resp 16 | Wt 148.8 lb

## 2023-07-06 DIAGNOSIS — Z888 Allergy status to other drugs, medicaments and biological substances status: Secondary | ICD-10-CM | POA: Diagnosis not present

## 2023-07-06 DIAGNOSIS — Z79899 Other long term (current) drug therapy: Secondary | ICD-10-CM | POA: Diagnosis not present

## 2023-07-06 DIAGNOSIS — R7402 Elevation of levels of lactic acid dehydrogenase (LDH): Secondary | ICD-10-CM | POA: Insufficient documentation

## 2023-07-06 DIAGNOSIS — I480 Paroxysmal atrial fibrillation: Secondary | ICD-10-CM | POA: Diagnosis not present

## 2023-07-06 DIAGNOSIS — D469 Myelodysplastic syndrome, unspecified: Secondary | ICD-10-CM | POA: Diagnosis not present

## 2023-07-06 DIAGNOSIS — D61818 Other pancytopenia: Secondary | ICD-10-CM | POA: Diagnosis not present

## 2023-07-06 DIAGNOSIS — C9310 Chronic myelomonocytic leukemia not having achieved remission: Secondary | ICD-10-CM | POA: Diagnosis not present

## 2023-07-06 DIAGNOSIS — Z87891 Personal history of nicotine dependence: Secondary | ICD-10-CM | POA: Insufficient documentation

## 2023-07-06 DIAGNOSIS — Z853 Personal history of malignant neoplasm of breast: Secondary | ICD-10-CM | POA: Diagnosis not present

## 2023-07-06 DIAGNOSIS — Z85118 Personal history of other malignant neoplasm of bronchus and lung: Secondary | ICD-10-CM | POA: Diagnosis not present

## 2023-07-06 DIAGNOSIS — I252 Old myocardial infarction: Secondary | ICD-10-CM | POA: Diagnosis not present

## 2023-07-06 DIAGNOSIS — Z8719 Personal history of other diseases of the digestive system: Secondary | ICD-10-CM | POA: Insufficient documentation

## 2023-07-06 DIAGNOSIS — I350 Nonrheumatic aortic (valve) stenosis: Secondary | ICD-10-CM | POA: Diagnosis not present

## 2023-07-06 DIAGNOSIS — D4620 Refractory anemia with excess of blasts, unspecified: Secondary | ICD-10-CM | POA: Diagnosis present

## 2023-07-06 LAB — VITAMIN B12: Vitamin B-12: 1051 pg/mL — ABNORMAL HIGH (ref 180–914)

## 2023-07-06 LAB — CBC WITH DIFFERENTIAL/PLATELET
Abs Immature Granulocytes: 0 10*3/uL (ref 0.00–0.07)
Basophils Absolute: 0 10*3/uL (ref 0.0–0.1)
Basophils Relative: 0 %
Eosinophils Absolute: 0 10*3/uL (ref 0.0–0.5)
Eosinophils Relative: 0 %
HCT: 33.2 % — ABNORMAL LOW (ref 36.0–46.0)
Hemoglobin: 9.5 g/dL — ABNORMAL LOW (ref 12.0–15.0)
Lymphocytes Relative: 65 %
Lymphs Abs: 1.4 10*3/uL (ref 0.7–4.0)
MCH: 30.4 pg (ref 26.0–34.0)
MCHC: 28.6 g/dL — ABNORMAL LOW (ref 30.0–36.0)
MCV: 106.4 fL — ABNORMAL HIGH (ref 80.0–100.0)
Metamyelocytes Relative: 1 %
Monocytes Absolute: 0.6 10*3/uL (ref 0.1–1.0)
Monocytes Relative: 29 %
Neutro Abs: 0.1 10*3/uL — CL (ref 1.7–7.7)
Neutrophils Relative %: 5 %
Platelets: 57 10*3/uL — ABNORMAL LOW (ref 150–400)
RBC: 3.12 MIL/uL — ABNORMAL LOW (ref 3.87–5.11)
RDW: 18.5 % — ABNORMAL HIGH (ref 11.5–15.5)
WBC: 2.2 10*3/uL — ABNORMAL LOW (ref 4.0–10.5)
nRBC: 1.3 % — ABNORMAL HIGH (ref 0.0–0.2)
nRBC: 2 /100{WBCs} — ABNORMAL HIGH

## 2023-07-06 LAB — IRON AND TIBC
Iron: 67 ug/dL (ref 28–170)
Saturation Ratios: 24 % (ref 10.4–31.8)
TIBC: 275 ug/dL (ref 250–450)
UIBC: 208 ug/dL

## 2023-07-06 LAB — SAMPLE TO BLOOD BANK

## 2023-07-06 LAB — FERRITIN: Ferritin: 504 ng/mL — ABNORMAL HIGH (ref 11–307)

## 2023-07-06 LAB — FOLATE: Folate: >40 ng/mL (ref >5.9)

## 2023-07-06 LAB — LACTATE DEHYDROGENASE: LDH: 210 U/L — ABNORMAL HIGH (ref 98–192)

## 2023-07-06 NOTE — Progress Notes (Unsigned)
CRITICAL VALUE ALERT Critical value received:  ANC 0.1 Date of notification:  07-06-2023 Time of notification: 1200 Critical value read back:  Yes.   Nurse who received alert:  Kathrin Greathouse RN MD notified time and response:  1200, Dr. [name]

## 2023-07-06 NOTE — Patient Instructions (Signed)
[facility] at [facility] Discharge Instructions   You were seen and examined today by Dr. [name].  He reviewed the results of your bone marrow biopsy. It is showing that the MDS you have has progressed. Dr. Kirtland Bouchard does not recommend any aggressive treatment for this at this time. We will continue to monitor your blood work closely and continue Aranesp injections as needed to keep your hemoglobin up.   We will check blood work today. We will get you started back on the injections next week.   Return as scheduled.    Thank you for choosing [facility] at [facility] to provide your oncology and hematology care.  To afford each patient quality time with our provider, please arrive at least 15 minutes before your scheduled appointment time.   If you have a lab appointment with the Cancer Center please come in thru the Main Entrance and check in at the main information desk.  You need to re-schedule your appointment should you arrive 10 or more minutes late.  We strive to give you quality time with our providers, and arriving late affects you and other patients whose appointments are after yours.  Also, if you no show three or more times for appointments you may be dismissed from the clinic at the providers discretion.     Again, thank you for choosing [facility].  Our hope is that these requests will decrease the amount of time that you wait before being seen by our physicians.       _____________________________________________________________  Should you have questions after your visit to [facility], please contact our office at [phone] and follow the prompts.  Our office hours are 8:00 a.m. and 4:30 p.m. Monday - Friday.  Please note that voicemails left after 4:00 p.m. may not be returned until the following business day.  We are closed weekends and major holidays.  You do have access to a nurse 24-7, just call the  main number to the clinic [phone] and do not press any options, hold on the line and a nurse will answer the phone.    For prescription refill requests, have your pharmacy contact our office and allow 72 hours.    Due to Covid, you will need to wear a mask upon entering the hospital. If you do not have a mask, a mask will be given to you at the Main Entrance upon arrival. For doctor visits, patients may have 1 support person age 67 or older with them. For treatment visits, patients can not have anyone with them due to social distancing guidelines and our immunocompromised population.

## 2023-07-07 DIAGNOSIS — J9621 Acute and chronic respiratory failure with hypoxia: Secondary | ICD-10-CM | POA: Diagnosis not present

## 2023-07-07 DIAGNOSIS — C9310 Chronic myelomonocytic leukemia not having achieved remission: Secondary | ICD-10-CM | POA: Diagnosis not present

## 2023-07-07 DIAGNOSIS — D630 Anemia in neoplastic disease: Secondary | ICD-10-CM | POA: Diagnosis not present

## 2023-07-07 DIAGNOSIS — J9622 Acute and chronic respiratory failure with hypercapnia: Secondary | ICD-10-CM | POA: Diagnosis not present

## 2023-07-07 DIAGNOSIS — I5031 Acute diastolic (congestive) heart failure: Secondary | ICD-10-CM | POA: Diagnosis not present

## 2023-07-07 DIAGNOSIS — J441 Chronic obstructive pulmonary disease with (acute) exacerbation: Secondary | ICD-10-CM | POA: Diagnosis not present

## 2023-07-08 DIAGNOSIS — M6281 Muscle weakness (generalized): Secondary | ICD-10-CM | POA: Diagnosis not present

## 2023-07-08 DIAGNOSIS — I480 Paroxysmal atrial fibrillation: Secondary | ICD-10-CM | POA: Diagnosis not present

## 2023-07-08 DIAGNOSIS — J441 Chronic obstructive pulmonary disease with (acute) exacerbation: Secondary | ICD-10-CM | POA: Diagnosis not present

## 2023-07-08 DIAGNOSIS — D500 Iron deficiency anemia secondary to blood loss (chronic): Secondary | ICD-10-CM | POA: Diagnosis not present

## 2023-07-08 DIAGNOSIS — I5022 Chronic systolic (congestive) heart failure: Secondary | ICD-10-CM | POA: Diagnosis not present

## 2023-07-08 DIAGNOSIS — G4700 Insomnia, unspecified: Secondary | ICD-10-CM | POA: Diagnosis not present

## 2023-07-08 DIAGNOSIS — E875 Hyperkalemia: Secondary | ICD-10-CM | POA: Diagnosis not present

## 2023-07-08 DIAGNOSIS — I110 Hypertensive heart disease with heart failure: Secondary | ICD-10-CM | POA: Diagnosis not present

## 2023-07-08 DIAGNOSIS — D469 Myelodysplastic syndrome, unspecified: Secondary | ICD-10-CM | POA: Diagnosis not present

## 2023-07-08 DIAGNOSIS — J9610 Chronic respiratory failure, unspecified whether with hypoxia or hypercapnia: Secondary | ICD-10-CM | POA: Diagnosis not present

## 2023-07-08 DIAGNOSIS — I252 Old myocardial infarction: Secondary | ICD-10-CM | POA: Diagnosis not present

## 2023-07-08 DIAGNOSIS — J449 Chronic obstructive pulmonary disease, unspecified: Secondary | ICD-10-CM | POA: Diagnosis not present

## 2023-07-09 DIAGNOSIS — J441 Chronic obstructive pulmonary disease with (acute) exacerbation: Secondary | ICD-10-CM | POA: Diagnosis not present

## 2023-07-09 DIAGNOSIS — D630 Anemia in neoplastic disease: Secondary | ICD-10-CM | POA: Diagnosis not present

## 2023-07-09 DIAGNOSIS — J9622 Acute and chronic respiratory failure with hypercapnia: Secondary | ICD-10-CM | POA: Diagnosis not present

## 2023-07-09 DIAGNOSIS — J9621 Acute and chronic respiratory failure with hypoxia: Secondary | ICD-10-CM | POA: Diagnosis not present

## 2023-07-09 DIAGNOSIS — C9310 Chronic myelomonocytic leukemia not having achieved remission: Secondary | ICD-10-CM | POA: Diagnosis not present

## 2023-07-09 DIAGNOSIS — I5031 Acute diastolic (congestive) heart failure: Secondary | ICD-10-CM | POA: Diagnosis not present

## 2023-07-12 ENCOUNTER — Other Ambulatory Visit: Payer: Self-pay

## 2023-07-12 ENCOUNTER — Emergency Department (HOSPITAL_COMMUNITY): Payer: Medicare Other

## 2023-07-12 ENCOUNTER — Inpatient Hospital Stay (HOSPITAL_COMMUNITY)
Admission: EM | Admit: 2023-07-12 | Discharge: 2023-07-15 | DRG: 280 | Disposition: A | Discharge status: Home Health | Payer: Medicare Other | Attending: Family Medicine | Admitting: Family Medicine

## 2023-07-12 ENCOUNTER — Other Ambulatory Visit (HOSPITAL_COMMUNITY): Payer: Self-pay | Admitting: *Deleted

## 2023-07-12 ENCOUNTER — Inpatient Hospital Stay (HOSPITAL_COMMUNITY): Payer: Medicare Other

## 2023-07-12 ENCOUNTER — Encounter (HOSPITAL_COMMUNITY): Payer: Self-pay | Admitting: Emergency Medicine

## 2023-07-12 DIAGNOSIS — I480 Paroxysmal atrial fibrillation: Secondary | ICD-10-CM | POA: Diagnosis present

## 2023-07-12 DIAGNOSIS — Z1152 Encounter for screening for COVID-19: Secondary | ICD-10-CM | POA: Diagnosis not present

## 2023-07-12 DIAGNOSIS — R0609 Other forms of dyspnea: Secondary | ICD-10-CM

## 2023-07-12 DIAGNOSIS — R54 Age-related physical debility: Secondary | ICD-10-CM | POA: Diagnosis present

## 2023-07-12 DIAGNOSIS — I5032 Chronic diastolic (congestive) heart failure: Secondary | ICD-10-CM | POA: Diagnosis present

## 2023-07-12 DIAGNOSIS — H9190 Unspecified hearing loss, unspecified ear: Secondary | ICD-10-CM | POA: Diagnosis present

## 2023-07-12 DIAGNOSIS — C9310 Chronic myelomonocytic leukemia not having achieved remission: Secondary | ICD-10-CM | POA: Diagnosis not present

## 2023-07-12 DIAGNOSIS — I130 Hypertensive heart and chronic kidney disease with heart failure and stage 1 through stage 4 chronic kidney disease, or unspecified chronic kidney disease: Secondary | ICD-10-CM | POA: Diagnosis present

## 2023-07-12 DIAGNOSIS — Z7982 Long term (current) use of aspirin: Secondary | ICD-10-CM

## 2023-07-12 DIAGNOSIS — D631 Anemia in chronic kidney disease: Secondary | ICD-10-CM | POA: Diagnosis present

## 2023-07-12 DIAGNOSIS — I110 Hypertensive heart disease with heart failure: Secondary | ICD-10-CM | POA: Diagnosis not present

## 2023-07-12 DIAGNOSIS — I252 Old myocardial infarction: Secondary | ICD-10-CM

## 2023-07-12 DIAGNOSIS — D61818 Other pancytopenia: Secondary | ICD-10-CM | POA: Diagnosis present

## 2023-07-12 DIAGNOSIS — I4891 Unspecified atrial fibrillation: Secondary | ICD-10-CM | POA: Diagnosis not present

## 2023-07-12 DIAGNOSIS — E875 Hyperkalemia: Secondary | ICD-10-CM | POA: Diagnosis not present

## 2023-07-12 DIAGNOSIS — J449 Chronic obstructive pulmonary disease, unspecified: Secondary | ICD-10-CM

## 2023-07-12 DIAGNOSIS — I214 Non-ST elevation (NSTEMI) myocardial infarction: Principal | ICD-10-CM | POA: Diagnosis present

## 2023-07-12 DIAGNOSIS — Z886 Allergy status to analgesic agent status: Secondary | ICD-10-CM

## 2023-07-12 DIAGNOSIS — I2723 Pulmonary hypertension due to lung diseases and hypoxia: Secondary | ICD-10-CM | POA: Diagnosis not present

## 2023-07-12 DIAGNOSIS — J9601 Acute respiratory failure with hypoxia: Secondary | ICD-10-CM | POA: Diagnosis not present

## 2023-07-12 DIAGNOSIS — I2722 Pulmonary hypertension due to left heart disease: Secondary | ICD-10-CM | POA: Diagnosis not present

## 2023-07-12 DIAGNOSIS — Z853 Personal history of malignant neoplasm of breast: Secondary | ICD-10-CM

## 2023-07-12 DIAGNOSIS — Z7189 Other specified counseling: Secondary | ICD-10-CM | POA: Diagnosis not present

## 2023-07-12 DIAGNOSIS — Z888 Allergy status to other drugs, medicaments and biological substances status: Secondary | ICD-10-CM

## 2023-07-12 DIAGNOSIS — Z8719 Personal history of other diseases of the digestive system: Secondary | ICD-10-CM

## 2023-07-12 DIAGNOSIS — Z9981 Dependence on supplemental oxygen: Secondary | ICD-10-CM | POA: Diagnosis not present

## 2023-07-12 DIAGNOSIS — I509 Heart failure, unspecified: Secondary | ICD-10-CM | POA: Diagnosis not present

## 2023-07-12 DIAGNOSIS — J441 Chronic obstructive pulmonary disease with (acute) exacerbation: Secondary | ICD-10-CM | POA: Diagnosis present

## 2023-07-12 DIAGNOSIS — R0603 Acute respiratory distress: Secondary | ICD-10-CM | POA: Diagnosis not present

## 2023-07-12 DIAGNOSIS — R140 Abdominal distension (gaseous): Secondary | ICD-10-CM | POA: Diagnosis not present

## 2023-07-12 DIAGNOSIS — Z923 Personal history of irradiation: Secondary | ICD-10-CM

## 2023-07-12 DIAGNOSIS — J9621 Acute and chronic respiratory failure with hypoxia: Secondary | ICD-10-CM | POA: Diagnosis present

## 2023-07-12 DIAGNOSIS — R0689 Other abnormalities of breathing: Secondary | ICD-10-CM | POA: Diagnosis not present

## 2023-07-12 DIAGNOSIS — Z87891 Personal history of nicotine dependence: Secondary | ICD-10-CM

## 2023-07-12 DIAGNOSIS — T501X6A Underdosing of loop [high-ceiling] diuretics, initial encounter: Secondary | ICD-10-CM | POA: Diagnosis present

## 2023-07-12 DIAGNOSIS — R000 Tachycardia, unspecified: Secondary | ICD-10-CM | POA: Diagnosis not present

## 2023-07-12 DIAGNOSIS — E039 Hypothyroidism, unspecified: Secondary | ICD-10-CM | POA: Diagnosis not present

## 2023-07-12 DIAGNOSIS — Z8673 Personal history of transient ischemic attack (TIA), and cerebral infarction without residual deficits: Secondary | ICD-10-CM

## 2023-07-12 DIAGNOSIS — J439 Emphysema, unspecified: Secondary | ICD-10-CM | POA: Diagnosis present

## 2023-07-12 DIAGNOSIS — I340 Nonrheumatic mitral (valve) insufficiency: Secondary | ICD-10-CM | POA: Diagnosis present

## 2023-07-12 DIAGNOSIS — Z91148 Patient's other noncompliance with medication regimen for other reason: Secondary | ICD-10-CM

## 2023-07-12 DIAGNOSIS — I10 Essential (primary) hypertension: Secondary | ICD-10-CM | POA: Diagnosis present

## 2023-07-12 DIAGNOSIS — Z79899 Other long term (current) drug therapy: Secondary | ICD-10-CM

## 2023-07-12 DIAGNOSIS — C3490 Malignant neoplasm of unspecified part of unspecified bronchus or lung: Secondary | ICD-10-CM | POA: Diagnosis not present

## 2023-07-12 DIAGNOSIS — I714 Abdominal aortic aneurysm, without rupture, unspecified: Secondary | ICD-10-CM | POA: Diagnosis present

## 2023-07-12 DIAGNOSIS — I080 Rheumatic disorders of both mitral and aortic valves: Secondary | ICD-10-CM | POA: Diagnosis not present

## 2023-07-12 DIAGNOSIS — N1832 Chronic kidney disease, stage 3b: Secondary | ICD-10-CM | POA: Diagnosis not present

## 2023-07-12 DIAGNOSIS — Z79890 Hormone replacement therapy (postmenopausal): Secondary | ICD-10-CM

## 2023-07-12 DIAGNOSIS — I2510 Atherosclerotic heart disease of native coronary artery without angina pectoris: Secondary | ICD-10-CM | POA: Diagnosis not present

## 2023-07-12 DIAGNOSIS — R0902 Hypoxemia: Secondary | ICD-10-CM | POA: Diagnosis not present

## 2023-07-12 DIAGNOSIS — I5033 Acute on chronic diastolic (congestive) heart failure: Secondary | ICD-10-CM | POA: Diagnosis present

## 2023-07-12 DIAGNOSIS — Z902 Acquired absence of lung [part of]: Secondary | ICD-10-CM

## 2023-07-12 DIAGNOSIS — Z85118 Personal history of other malignant neoplasm of bronchus and lung: Secondary | ICD-10-CM

## 2023-07-12 DIAGNOSIS — D638 Anemia in other chronic diseases classified elsewhere: Secondary | ICD-10-CM | POA: Diagnosis present

## 2023-07-12 DIAGNOSIS — D469 Myelodysplastic syndrome, unspecified: Secondary | ICD-10-CM | POA: Diagnosis present

## 2023-07-12 DIAGNOSIS — Z7951 Long term (current) use of inhaled steroids: Secondary | ICD-10-CM

## 2023-07-12 LAB — TROPONIN I (HIGH SENSITIVITY)
Troponin I (High Sensitivity): 44 ng/L — ABNORMAL HIGH (ref <18)
Troponin I (High Sensitivity): 2997 ng/L (ref <18)
Troponin I (High Sensitivity): >24000 ng/L (ref <18)

## 2023-07-12 LAB — RESP PANEL BY RT-PCR (RSV, FLU A&B, COVID)  RVPGX2
Influenza A by PCR: NEGATIVE
Influenza B by PCR: NEGATIVE
Resp Syncytial Virus by PCR: NEGATIVE
SARS Coronavirus 2 by RT PCR: NEGATIVE

## 2023-07-12 LAB — COMPREHENSIVE METABOLIC PANEL
ALT: 13 U/L (ref 0–44)
AST: 25 U/L (ref 15–41)
Albumin: 3.6 g/dL (ref 3.5–5.0)
Alkaline Phosphatase: 54 U/L (ref 38–126)
Anion gap: 9 (ref 5–15)
BUN: 17 mg/dL (ref 8–23)
CO2: 27 mmol/L (ref 22–32)
Calcium: 9.1 mg/dL (ref 8.9–10.3)
Chloride: 102 mmol/L (ref 98–111)
Creatinine, Ser: 1.09 mg/dL — ABNORMAL HIGH (ref 0.44–1.00)
GFR, Estimated: 49 mL/min — ABNORMAL LOW (ref >60)
Glucose, Bld: 183 mg/dL — ABNORMAL HIGH (ref 70–99)
Potassium: 5 mmol/L (ref 3.5–5.1)
Sodium: 138 mmol/L (ref 135–145)
Total Bilirubin: 0.7 mg/dL (ref 0.3–1.2)
Total Protein: 8.6 g/dL — ABNORMAL HIGH (ref 6.5–8.1)

## 2023-07-12 LAB — ECHOCARDIOGRAM COMPLETE
AR max vel: 0.87 cm2
AV Area VTI: 0.81 cm2
AV Area mean vel: 0.77 cm2
AV Mean grad: 22 mmHg
AV Peak grad: 41.9 mmHg
Ao pk vel: 3.24 m/s
Area-P 1/2: 4.15 cm2
Height: 63 in
MV M vel: 5.86 m/s
MV Peak grad: 137.4 mmHg
MV VTI: 2.03 cm2
Radius: 0.5 cm
S' Lateral: 2.7 cm
Weight: 2352.75 [oz_av]

## 2023-07-12 LAB — MRSA NEXT GEN BY PCR, NASAL: MRSA by PCR Next Gen: NOT DETECTED

## 2023-07-12 LAB — CBC WITH DIFFERENTIAL/PLATELET
Abs Immature Granulocytes: 0.6 10*3/uL — ABNORMAL HIGH (ref 0.00–0.07)
Band Neutrophils: 3 %
Basophils Absolute: 0 10*3/uL (ref 0.0–0.1)
Basophils Relative: 0 %
Eosinophils Absolute: 0 10*3/uL (ref 0.0–0.5)
Eosinophils Relative: 0 %
HCT: 38.1 % (ref 36.0–46.0)
Hemoglobin: 10.6 g/dL — ABNORMAL LOW (ref 12.0–15.0)
Lymphocytes Relative: 46 %
Lymphs Abs: 1.7 10*3/uL (ref 0.7–4.0)
MCH: 30.6 pg (ref 26.0–34.0)
MCHC: 27.8 g/dL — ABNORMAL LOW (ref 30.0–36.0)
MCV: 110.1 fL — ABNORMAL HIGH (ref 80.0–100.0)
Metamyelocytes Relative: 12 %
Monocytes Absolute: 0.2 10*3/uL (ref 0.1–1.0)
Monocytes Relative: 4 %
Myelocytes: 4 %
Neutro Abs: 1.3 10*3/uL — ABNORMAL LOW (ref 1.7–7.7)
Neutrophils Relative %: 31 %
Platelets: 80 10*3/uL — ABNORMAL LOW (ref 150–400)
RBC: 3.46 MIL/uL — ABNORMAL LOW (ref 3.87–5.11)
RDW: 18.2 % — ABNORMAL HIGH (ref 11.5–15.5)
WBC: 3.8 10*3/uL — ABNORMAL LOW (ref 4.0–10.5)
nRBC: 5 /100{WBCs} — ABNORMAL HIGH
nRBC: 8.6 % — ABNORMAL HIGH (ref 0.0–0.2)

## 2023-07-12 LAB — PROTIME-INR
INR: 1.2 (ref 0.8–1.2)
Prothrombin Time: 15.7 s — ABNORMAL HIGH (ref 11.4–15.2)

## 2023-07-12 LAB — BRAIN NATRIURETIC PEPTIDE: B Natriuretic Peptide: 571 pg/mL — ABNORMAL HIGH (ref 0.0–100.0)

## 2023-07-12 LAB — GLUCOSE, CAPILLARY
Glucose-Capillary: 77 mg/dL (ref 70–99)
Glucose-Capillary: 117 mg/dL — ABNORMAL HIGH (ref 70–99)

## 2023-07-12 LAB — HEPARIN LEVEL (UNFRACTIONATED): Heparin Unfractionated: 0.48 [IU]/mL (ref 0.30–0.70)

## 2023-07-12 LAB — APTT: aPTT: 33 s (ref 24–36)

## 2023-07-12 LAB — MAGNESIUM: Magnesium: 2 mg/dL (ref 1.7–2.4)

## 2023-07-12 MED ORDER — LEVOTHYROXINE SODIUM 100 MCG PO TABS
100.0000 ug | ORAL_TABLET | Freq: Every day | ORAL | Status: DC
  Administered 2023-07-13 – 2023-07-15 (×3): 100 ug via ORAL
  Filled 2023-07-12 (×3): qty 1

## 2023-07-12 MED ORDER — ACETAMINOPHEN 325 MG PO TABS
650.0000 mg | ORAL_TABLET | Freq: Four times a day (QID) | ORAL | Status: DC | PRN
  Administered 2023-07-13: 650 mg via ORAL
  Filled 2023-07-12: qty 2

## 2023-07-12 MED ORDER — METHYLPREDNISOLONE SODIUM SUCC 125 MG IJ SOLR
125.0000 mg | Freq: Once | INTRAMUSCULAR | Status: AC
  Administered 2023-07-12: 125 mg via INTRAVENOUS
  Filled 2023-07-12: qty 2

## 2023-07-12 MED ORDER — CHLORHEXIDINE GLUCONATE CLOTH 2 % EX PADS
6.0000 | MEDICATED_PAD | Freq: Every day | CUTANEOUS | Status: DC
  Administered 2023-07-12 – 2023-07-15 (×4): 6 via TOPICAL

## 2023-07-12 MED ORDER — ACETAMINOPHEN 650 MG RE SUPP: 650.0000 mg | Freq: Four times a day (QID) | RECTAL | Status: DC | PRN

## 2023-07-12 MED ORDER — METHYLPREDNISOLONE SODIUM SUCC 40 MG IJ SOLR
40.0000 mg | Freq: Two times a day (BID) | INTRAMUSCULAR | Status: DC
  Administered 2023-07-12: 40 mg via INTRAVENOUS
  Filled 2023-07-12: qty 1

## 2023-07-12 MED ORDER — METOPROLOL SUCCINATE ER 25 MG PO TB24
25.0000 mg | ORAL_TABLET | Freq: Every day | ORAL | Status: DC
  Administered 2023-07-13 – 2023-07-15 (×3): 25 mg via ORAL
  Filled 2023-07-12 (×3): qty 1

## 2023-07-12 MED ORDER — PRAVASTATIN SODIUM 10 MG PO TABS
10.0000 mg | ORAL_TABLET | Freq: Every evening | ORAL | Status: DC
  Administered 2023-07-12: 10 mg via ORAL
  Filled 2023-07-12: qty 1

## 2023-07-12 MED ORDER — ONDANSETRON HCL 4 MG/2ML IJ SOLN: 4.0000 mg | Freq: Four times a day (QID) | INTRAMUSCULAR | Status: DC | PRN

## 2023-07-12 MED ORDER — PANTOPRAZOLE SODIUM 40 MG PO TBEC
40.0000 mg | DELAYED_RELEASE_TABLET | Freq: Two times a day (BID) | ORAL | Status: DC
  Administered 2023-07-12 – 2023-07-15 (×6): 40 mg via ORAL
  Filled 2023-07-12 (×6): qty 1

## 2023-07-12 MED ORDER — ONDANSETRON HCL 4 MG PO TABS: 4.0000 mg | ORAL_TABLET | Freq: Four times a day (QID) | ORAL | Status: DC | PRN

## 2023-07-12 MED ORDER — ISOSORBIDE MONONITRATE ER 60 MG PO TB24
60.0000 mg | ORAL_TABLET | Freq: Every day | ORAL | Status: DC
  Administered 2023-07-13 – 2023-07-15 (×3): 60 mg via ORAL
  Filled 2023-07-12 (×3): qty 1

## 2023-07-12 MED ORDER — FLUCONAZOLE 100 MG PO TABS: 100.0000 mg | ORAL_TABLET | Freq: Every day | ORAL | Status: DC

## 2023-07-12 MED ORDER — ASPIRIN 81 MG PO TBEC
81.0000 mg | DELAYED_RELEASE_TABLET | Freq: Every day | ORAL | Status: DC
  Administered 2023-07-12 – 2023-07-15 (×4): 81 mg via ORAL
  Filled 2023-07-12 (×4): qty 1

## 2023-07-12 MED ORDER — PSYLLIUM 95 % PO PACK
1.0000 | PACK | Freq: Every day | ORAL | Status: DC
  Administered 2023-07-13 – 2023-07-15 (×3): 1 via ORAL
  Filled 2023-07-12 (×5): qty 1

## 2023-07-12 MED ORDER — BUDESONIDE 0.25 MG/2ML IN SUSP
0.2500 mg | Freq: Two times a day (BID) | RESPIRATORY_TRACT | Status: DC
  Administered 2023-07-12 – 2023-07-13 (×3): 0.25 mg via RESPIRATORY_TRACT
  Filled 2023-07-12 (×3): qty 2

## 2023-07-12 MED ORDER — IPRATROPIUM-ALBUTEROL 0.5-2.5 (3) MG/3ML IN SOLN
3.0000 mL | Freq: Four times a day (QID) | RESPIRATORY_TRACT | Status: DC
  Administered 2023-07-12 (×2): 3 mL via RESPIRATORY_TRACT
  Filled 2023-07-12 (×2): qty 3

## 2023-07-12 MED ORDER — ADULT MULTIVITAMIN W/MINERALS CH
1.0000 | ORAL_TABLET | Freq: Every day | ORAL | Status: DC
  Administered 2023-07-13 – 2023-07-15 (×3): 1 via ORAL
  Filled 2023-07-12 (×3): qty 1

## 2023-07-12 MED ORDER — SENNA 8.6 MG PO TABS
1.0000 | ORAL_TABLET | Freq: Every day | ORAL | Status: DC
  Administered 2023-07-12 – 2023-07-14 (×3): 8.6 mg via ORAL
  Filled 2023-07-12 (×3): qty 1

## 2023-07-12 MED ORDER — FUROSEMIDE 10 MG/ML IJ SOLN
40.0000 mg | Freq: Once | INTRAMUSCULAR | Status: AC
  Administered 2023-07-12: 40 mg via INTRAVENOUS
  Filled 2023-07-12: qty 4

## 2023-07-12 MED ORDER — FUROSEMIDE 10 MG/ML IJ SOLN
40.0000 mg | Freq: Two times a day (BID) | INTRAMUSCULAR | Status: DC
  Administered 2023-07-12: 40 mg via INTRAVENOUS
  Filled 2023-07-12 (×2): qty 4

## 2023-07-12 MED ORDER — HEPARIN BOLUS VIA INFUSION
4000.0000 [IU] | Freq: Once | INTRAVENOUS | Status: AC
  Administered 2023-07-12: 4000 [IU] via INTRAVENOUS
  Filled 2023-07-12: qty 4000

## 2023-07-12 MED ORDER — HEPARIN (PORCINE) 25000 UT/250ML-% IV SOLN
850.0000 [IU]/h | INTRAVENOUS | Status: DC
  Administered 2023-07-12 – 2023-07-13 (×2): 750 [IU]/h via INTRAVENOUS
  Filled 2023-07-12 (×2): qty 250

## 2023-07-12 MED ORDER — FUROSEMIDE 10 MG/ML IJ SOLN
20.0000 mg | Freq: Once | INTRAMUSCULAR | Status: AC
  Administered 2023-07-12: 20 mg via INTRAVENOUS
  Filled 2023-07-12: qty 2

## 2023-07-12 MED ORDER — IPRATROPIUM-ALBUTEROL 0.5-2.5 (3) MG/3ML IN SOLN
3.0000 mL | Freq: Once | RESPIRATORY_TRACT | Status: AC
  Administered 2023-07-12: 3 mL via RESPIRATORY_TRACT
  Filled 2023-07-12: qty 3

## 2023-07-12 MED ORDER — MAGNESIUM OXIDE -MG SUPPLEMENT 400 (240 MG) MG PO TABS
200.0000 mg | ORAL_TABLET | Freq: Every day | ORAL | Status: DC
  Administered 2023-07-12 – 2023-07-14 (×3): 200 mg via ORAL
  Filled 2023-07-12 (×3): qty 1

## 2023-07-12 MED ORDER — MELATONIN 3 MG PO TABS
3.0000 mg | ORAL_TABLET | Freq: Every day | ORAL | Status: DC
  Administered 2023-07-12 – 2023-07-14 (×3): 3 mg via ORAL
  Filled 2023-07-12 (×3): qty 1

## 2023-07-12 NOTE — ED Notes (Signed)
Report accepted by [place], RN on ICU

## 2023-07-12 NOTE — ED Provider Notes (Signed)
[place] EMERGENCY DEPARTMENT AT [facility] Provider Note   CSN: [id number] Arrival date & time: 07/12/23  0745     History  Chief Complaint  Patient presents with   Respiratory Distress    Lisa [name] is a 89 y.o. female with a past medical history of HFrEF, COPD, atrial fibrillation and recent NSTEMI who presents to the ED in respiratory distress.  She reports that her respiratory distress began as mild shortness of breath last night and worsened this morning.  Her son called 911 this morning and EMS noted her to have an oxygen saturation in the 50s on her baseline 2 L O2 nasal cannula.  Denies chest pain, fevers, chills, nausea, vomiting abdominal pain.  States she felt well for today yesterday.  She was recently admitted for COPD/heart failure exacerbation on July 26 and discharged on August 9.  HPI     Home Medications Prior to Admission medications   Medication Sig Start Date End Date Taking? Authorizing Provider  acetaminophen (TYLENOL) 325 MG tablet Take 1 tablet (325 mg total) by mouth every 6 (six) hours as needed for mild pain (or Fever >/= 101). 06/11/23   Setzer, Lynnell Jude, PA-C  albuterol (VENTOLIN HFA) 108 (90 Base) MCG/ACT inhaler Inhale 1-2 puffs into the lungs every 4 (four) hours as needed for wheezing or shortness of breath. 05/25/22   [provider]  Fluticasone-Umeclidin-Vilant (TRELEGY ELLIPTA) 100-62.5-25 MCG/ACT AEPB Inhale 1 puff into the lungs daily. 12/18/22   Oretha Milch, MD  furosemide (LASIX) 20 MG tablet Take 1 tablet (20 mg total) by mouth daily. 04/09/23   Johnson, Clanford L, MD  isosorbide mononitrate (IMDUR) 60 MG 24 hr tablet Take 1 tablet (60 mg total) by mouth daily. 06/12/23   Setzer, Lynnell Jude, PA-C  levothyroxine (SYNTHROID) 100 MCG tablet Take 1 tablet (100 mcg total) by mouth daily before breakfast. 08/28/21   Johnson, Clanford L, MD  magnesium oxide (MAG-OX) 400 MG tablet Take 0.5 tablets (200 mg total) by mouth at  bedtime. 06/11/23   Setzer, Lynnell Jude, PA-C  melatonin 3 MG TABS tablet Take 1 tablet (3 mg total) by mouth at bedtime. 06/11/23   Setzer, Lynnell Jude, PA-C  metoprolol succinate (TOPROL-XL) 25 MG 24 hr tablet TAKE 1 TABLET DAILY 02/18/23   Mallipeddi, Vishnu P, MD  mirtazapine (REMERON) 15 MG tablet Take 1 tablet (15 mg total) by mouth at bedtime. 06/11/23   Setzer, Lynnell Jude, PA-C  Multiple Vitamin (MULTI-VITAMIN) tablet Take 1 tablet by mouth daily.    [provider]  pantoprazole (PROTONIX) 40 MG tablet Take 1 tablet (40 mg total) by mouth 2 (two) times daily. 04/09/23   Johnson, Clanford L, MD  pravastatin (PRAVACHOL) 10 MG tablet Take 1 tablet (10 mg total) by mouth every evening. 04/09/23   Johnson, Clanford L, MD  predniSONE (DELTASONE) 10 MG tablet Take 1 tablet (10 mg total) by mouth daily with breakfast. 06/11/23   Setzer, Lynnell Jude, PA-C  psyllium (HYDROCIL/METAMUCIL) 95 % PACK Take 1 packet by mouth daily. 06/11/23   Setzer, Lynnell Jude, PA-C  senna (SENOKOT) 8.6 MG TABS tablet Take 1 tablet (8.6 mg total) by mouth at bedtime. 06/11/23   Setzer, Lynnell Jude, PA-C      Allergies    Meloxicam and Micardis hct [telmisartan-hctz]    Review of Systems   Review of Systems  Respiratory:  Positive for shortness of breath.   Cardiovascular:  Negative for chest pain.  All other systems reviewed  and are negative.   Physical Exam Updated Vital Signs Pulse 84   Resp (!) 27   Ht 5\' 3"  (1.6 m)   Wt 67 kg   SpO2 96%   BMI 26.17 kg/m  Physical Exam Vitals and nursing note reviewed.  HENT:     Head: Normocephalic and atraumatic.  Eyes:     Pupils: Pupils are equal, round, and reactive to light.  Cardiovascular:     Rate and Rhythm: Normal rate and regular rhythm.  Pulmonary:     Effort: Respiratory distress present.     Breath sounds: Wheezing and rales present.     Comments: Tachypnea with bilateral apical wheezing and rales at bilateral bases Abdominal:     Palpations: Abdomen is soft.      Tenderness: There is no abdominal tenderness.  Skin:    General: Skin is warm and dry.  Neurological:     General: No focal deficit present.     Mental Status: She is alert.  Psychiatric:        Mood and Affect: Mood normal.     ED Results / Procedures / Treatments   Labs (all labs ordered are listed, but only abnormal results are displayed) Labs Reviewed  COMPREHENSIVE METABOLIC PANEL - Abnormal; Notable for the following components:      Result Value   Glucose, Bld 183 (*)    Creatinine, Ser 1.09 (*)    Total Protein 8.6 (*)    GFR, Estimated 49 (*)    All other components within normal limits  TROPONIN I (HIGH SENSITIVITY) - Abnormal; Notable for the following components:   Troponin I (High Sensitivity) 44 (*)    All other components within normal limits  RESP PANEL BY RT-PCR (RSV, FLU A&B, COVID)  RVPGX2  MAGNESIUM  BRAIN NATRIURETIC PEPTIDE  CBC WITH DIFFERENTIAL/PLATELET    EKG None  Radiology DG Chest Portable 1 View  Result Date: 07/12/2023 CLINICAL DATA:  89 year old female with difficulty breathing onset 0200 hours. Atrial fibrillation, lung cancer. EXAM: PORTABLE CHEST 1 VIEW COMPARISON:  Chest CTA 11/03/2022. Radiographs 06/09/2023 and earlier. FINDINGS: Portable AP upright view at 0838 hours. Emphysema demonstrated by CTA last year. Cardiomegaly. Calcified aortic atherosclerosis. Other mediastinal contours are within normal limits. Confluent and veiling bilateral lung base opacity appears only in part due to pleural effusions. There is some generalized increase in the pulmonary interstitium, vasculature appears indistinct. No pneumothorax. Visualized tracheal air column is within normal limits. No acute osseous abnormality identified. Chronic postoperative changes to the left axilla, chest wall. Paucity of bowel gas in the visible abdomen. IMPRESSION: 1. Emphysema with confluent, indeterminate bibasilar opacity which seems only in part due to layering pleural  effusions. Increased interstitium/vascularity elsewhere. Consider acute pulmonary edema and bilateral pleural effusions and atelectasis. Viral/atypical infection or lung base aspiration difficult to exclude. 2. Cardiomegaly, calcified aortic atherosclerosis. Electronically Signed   By: Odessa Fleming M.D.   On: 07/12/2023 09:05    Procedures .Critical Care  Performed by: Royanne Foots, DO Authorized by: Royanne Foots, DO   Critical care provider statement:    Critical care time (minutes):  30   Critical care time was exclusive of:  Separately billable procedures and treating other patients   Critical care was necessary to treat or prevent imminent or life-threatening deterioration of the following conditions:  Respiratory failure   Critical care was time spent personally by me on the following activities:  Development of treatment plan with patient or surrogate, evaluation of  patient's response to treatment, examination of patient, ordering and review of laboratory studies, ordering and review of radiographic studies, ordering and performing treatments and interventions, pulse oximetry, re-evaluation of patient's condition, review of old charts, discussions with consultants and obtaining history from patient or surrogate   I assumed direction of critical care for this patient from another provider in my specialty: no     Care discussed with: admitting provider   Ultrasound ED Thoracic  Date/Time: 07/12/2023 8:48 AM  Performed by: Royanne Foots, DO Authorized by: Royanne Foots, DO   Procedure details:    Indications: dyspnea     Assessment for:  Pleural effusion   Left lung pleural:  Visualized   Right lung pleural:  Visualized   Images: archived   Findings:    A-lines noted throughout: not identified     B-lines noted throughout: identified   Right Lung Findings:     right lung pleural effusion      Left Lung Findings:     no left lung pleural effusion Impression:    Impression:  pulmonary edema     Medications Ordered in ED Medications  ipratropium-albuterol (DUONEB) 0.5-2.5 (3) MG/3ML nebulizer solution 3 mL (3 mLs Nebulization Given 07/12/23 0825)  methylPREDNISolone sodium succinate (SOLU-MEDROL) 125 mg/2 mL injection 125 mg (125 mg Intravenous Given 07/12/23 0840)  furosemide (LASIX) injection 20 mg (20 mg Intravenous Given 07/12/23 0840)    ED Course/ Medical Decision Making/ A&P    Medical Decision Making This patient presents to the ED for concern of respiratory distress, this involves an extensive number of treatment options, and is a complaint that carries with it a high risk of complications and morbidity.  The differential diagnosis includes heart failure exacerbation, COPD exacerbation, pneumonia, ACS and acute viral infection   Co morbidities that complicate the patient evaluation  COPD, HFpEF, atrial fibrillation   Additional history obtained:  Additional history obtained from son and daughter at bedside as well as EMR.  Reviewed last discharge summary of 06/11/2023   Imaging Studies ordered:  I ordered imaging studies including chest x-ray I independently visualized and interpreted imaging which showed bilateral pulmonary edema with bilateral pleural effusions right greater than left I agree with the radiologist interpretation   Cardiac Monitoring: / EKG:   my independent interpretation of twelve-lead ECG.  Sinus tachycardia 102 bpm.  LVH.  ST depressions in lateral leads similar to previous ECG of 06/24/2023   Consultations Obtained:     Medical Decision Making 89 year old female with history as above presenting in respiratory distress.  EMS noted patient to have oxygen saturation in the 50s on baseline 2 L O2.  Upon my initial assessment she is tachypneic but saturating well.  Lung auscultation reveals apical wheezing with rales bilateral bases.  Bedside ultrasound reveals diffuse B-lines with right sided pleural effusion.  Daughter at  bedside tells me she has not taken home dose of Lasix in the last several days.  Suspect most likely etiology would be heart failure exacerbation but did treat concomitantly for COPD exacerbation with 1 DuoNeb treatment and 125 mg IV Solu-Medrol.  Clinical improvement after initiation of BiPAP.  Will provide dose of IV Lasix and admit to medicine for further management of acute hypoxemic respiratory failure   Discussed with admitting hospitalist accepts patient for admission to medicine  I ordered medication including IV Lasix for diuresis Reevaluation of the patient after these medicines showed that the patient improved I have reviewed the patients home medicines and  have made adjustments as needed       Amount and/or Complexity of Data Reviewed Labs: ordered. Radiology: ordered.  Risk Prescription drug management. Decision regarding hospitalization.     Final Clinical Impression(s) / ED Diagnoses Final diagnoses:  Acute hypoxemic respiratory failure (HCC)  Congestive heart failure, unspecified HF chronicity, unspecified heart failure type (HCC)  Chronic obstructive pulmonary disease, unspecified COPD type [facility])    Rx / DC Orders ED Discharge Orders     None         Royanne Foots, DO 07/12/23 0919

## 2023-07-12 NOTE — Consult Note (Addendum)
Cardiology Consultation   Patient ID: Lisa [name] MRN: [id number]; DOB: 1934/08/03  Admit date: 07/12/2023 Date of Consult: 07/12/2023  PCP:  [name], MD   [facility] Providers Cardiologist:  Marjo Bicker, MD        Patient Profile:   Lisa [name] is a 89 y.o. female with a hx of paroxysmal afib (dx 2021, not felt to be a candidate for [facility]), TIA 2023, aortic stenosis, mitral regurgitation, COPD c/b chronic hypoxic respiratory failure on 2L baseline, chronic HFpEF, severe pHTN, CKD3b,  MDS/CMML c/b chronic neutropenia and thrombocytopenia, GI bleeding, NSTEMI managed medically, 4.8cm AAA in 11/2022 not felt to be surgical candidate (followed by [facility]), hyperkalemia, breast CA s/p lumpectomy/radiation, lung CA s/p LULectomy, hypothyroidism, hearing loss  who is being seen 07/12/2023 for the evaluation of elevated troponins at the request of Dr. Sherryll Burger.  History of Present Illness:   [name] has above history who was seen 04/09/2023 when for NSTEMI, peak troponin 325.  Her EF was normal with grade 2 diastolic dysfunction and a mean aortic valve gradient of 31 mmHg.  She is not a candidate for invasive ischemic evaluation.  She is also not a candidate for antiplatelet or anticoagulation.  Symptomatic management with Imdur and keeping her hemoglobin greater than 8 was recommended. Her Afib has also been difficult to control and managed with BB. Overall poor prognosis given multiple advanced co morbidities.   She now presents with worsening dyspnea and respiratory distress and hasn't been taking lasix. She was put on BiPap and given IV lasix.troponins 44, 2997. Patient comfortable now. She says she only had dyspnea and denies any chest pain, edema.      Past Medical History:  Diagnosis Date   AAA (abdominal aortic aneurysm) (HCC)    Adenocarcinoma of left lung (HCC) 2006   AF (paroxysmal atrial fibrillation) (HCC)    Aortic stenosis    Arthritis    Asthma     Cancer of breast, female (HCC)    Cancer of lung (HCC)    Dysrhythmia    Hypertension    Hypothyroidism    Invasive ductal carcinoma of left breast (HCC) 1999   Mitral regurgitation    Myocardial infarction (HCC)    Neutropenia (HCC) 06/09/2016   Personal history of radiation therapy    Pulmonary hypertension (HCC)     Past Surgical History:  Procedure Laterality Date   BIOPSY  10/23/2020   Procedure: BIOPSY;  Surgeon: Benancio Deeds, MD;  Location: [facility] ENDOSCOPY;  Service: Gastroenterology;;   BREAST BIOPSY     left axillary node dissection   BREAST LUMPECTOMY Left    COLONOSCOPY  03/08/2003   WUJ:[id number] rectal polyps destroyed with the tip of the snare/Polyps at hepatic flexure, splenic flexure at 35 cm/Left-sided diverticula: unable to retrieve path   COLONOSCOPY  09/12/2008   FAO:ZHYQMV rectum and distal sigmoid diminutive polyps/scattered left sided diverticulum. hyperplastic   COLONOSCOPY N/A 03/06/2013   HQI:ONGEXBM polyp-removed as described above; colonic diverticulosis. hyperplastic polyps. next TCS 03/2018   ESOPHAGOGASTRODUODENOSCOPY (EGD) WITH PROPOFOL N/A 10/23/2020   Procedure: ESOPHAGOGASTRODUODENOSCOPY (EGD) WITH PROPOFOL;  Surgeon: Benancio Deeds, MD;  Location: [facility] ENDOSCOPY;  Service: Gastroenterology;  Laterality: N/A;   FOOT SURGERY     LUNG REMOVAL, PARTIAL     upper lobe     Home Medications:  Prior to Admission medications   Medication Sig Start Date End Date Taking? Authorizing Provider  acetaminophen (TYLENOL) 325 MG tablet Take 1 tablet (325  mg total) by mouth every 6 (six) hours as needed for mild pain (or Fever >/= 101). 06/11/23   Setzer, Lynnell Jude, PA-C  albuterol (VENTOLIN HFA) 108 (90 Base) MCG/ACT inhaler Inhale 1-2 puffs into the lungs every 4 (four) hours as needed for wheezing or shortness of breath. 05/25/22   [provider]  Fluticasone-Umeclidin-Vilant (TRELEGY ELLIPTA) 100-62.5-25 MCG/ACT AEPB Inhale 1 puff into the  lungs daily. 12/18/22   Oretha Milch, MD  furosemide (LASIX) 20 MG tablet Take 1 tablet (20 mg total) by mouth daily. 04/09/23   Johnson, Clanford L, MD  isosorbide mononitrate (IMDUR) 60 MG 24 hr tablet Take 1 tablet (60 mg total) by mouth daily. 06/12/23   Setzer, Lynnell Jude, PA-C  levothyroxine (SYNTHROID) 100 MCG tablet Take 1 tablet (100 mcg total) by mouth daily before breakfast. 08/28/21   Johnson, Clanford L, MD  magnesium oxide (MAG-OX) 400 MG tablet Take 0.5 tablets (200 mg total) by mouth at bedtime. 06/11/23   Setzer, Lynnell Jude, PA-C  melatonin 3 MG TABS tablet Take 1 tablet (3 mg total) by mouth at bedtime. 06/11/23   Setzer, Lynnell Jude, PA-C  metoprolol succinate (TOPROL-XL) 25 MG 24 hr tablet TAKE 1 TABLET DAILY 02/18/23   Mallipeddi, Vishnu P, MD  mirtazapine (REMERON) 15 MG tablet Take 1 tablet (15 mg total) by mouth at bedtime. 06/11/23   Setzer, Lynnell Jude, PA-C  Multiple Vitamin (MULTI-VITAMIN) tablet Take 1 tablet by mouth daily.    [provider]  pantoprazole (PROTONIX) 40 MG tablet Take 1 tablet (40 mg total) by mouth 2 (two) times daily. 04/09/23   Johnson, Clanford L, MD  pravastatin (PRAVACHOL) 10 MG tablet Take 1 tablet (10 mg total) by mouth every evening. 04/09/23   Johnson, Clanford L, MD  predniSONE (DELTASONE) 10 MG tablet Take 1 tablet (10 mg total) by mouth daily with breakfast. 06/11/23   Setzer, Lynnell Jude, PA-C  psyllium (HYDROCIL/METAMUCIL) 95 % PACK Take 1 packet by mouth daily. 06/11/23   Setzer, Lynnell Jude, PA-C  senna (SENOKOT) 8.6 MG TABS tablet Take 1 tablet (8.6 mg total) by mouth at bedtime. 06/11/23   Setzer, Lynnell Jude, PA-C    Inpatient Medications: Scheduled Meds:  budesonide (PULMICORT) nebulizer solution  0.25 mg Nebulization BID   Chlorhexidine Gluconate Cloth  6 each Topical Q0600   furosemide  40 mg Intravenous Q12H   ipratropium-albuterol  3 mL Nebulization Q6H   isosorbide mononitrate  60 mg Oral Daily   [START ON 07/13/2023] levothyroxine  100 mcg Oral QAC  breakfast   magnesium oxide  200 mg Oral QHS   melatonin  3 mg Oral QHS   methylPREDNISolone (SOLU-MEDROL) injection  40 mg Intravenous Q12H   metoprolol succinate  25 mg Oral Daily   Multi-Vitamin  1 tablet Oral Daily   pantoprazole  40 mg Oral BID   pravastatin  10 mg Oral QPM   psyllium  1 packet Oral Daily   senna  1 tablet Oral QHS   Continuous Infusions:  heparin 750 Units/hr (07/12/23 1135)   PRN Meds: acetaminophen **OR** acetaminophen, ondansetron **OR** ondansetron (ZOFRAN) IV  Allergies:    Allergies  Allergen Reactions   Meloxicam Other (See Comments)    Caused an injury to the kidneys, per nephrologist   Micardis Hct [Telmisartan-Hctz] Other (See Comments)    Caused an injury to the kidneys, per nephrologist    Social History:   Social History   Socioeconomic History   Marital status: Widowed  Spouse name: Not on file   Number of children: Not on file   Years of education: Not on file   Highest education level: Not on file  Occupational History   Not on file  Tobacco Use   Smoking status: Former    Current packs/day: 0.00    Average packs/day: 0.8 packs/day for 35.0 years (26.3 ttl pk-yrs)    Types: Cigarettes    Start date: 89    Quit date: 67    Years since quitting: 31.7   Smokeless tobacco: Never   Tobacco comments:    smoke-free X 30 yeras  Vaping Use   Vaping status: Never Used  Substance and Sexual Activity   Alcohol use: No   Drug use: No   Sexual activity: Not Currently  Other Topics Concern   Not on file  Social History Narrative   Not on file   Social Determinants of Health   Financial Resource Strain: Not on file  Food Insecurity: No Food Insecurity (07/12/2023)   Hunger Vital Sign    Worried About Running Out of Food in the Last Year: Never true    Ran Out of Food in the Last Year: Never true  Transportation Needs: No Transportation Needs (07/12/2023)   PRAPARE - Administrator, Civil Service (Medical): No     Lack of Transportation (Non-Medical): No  Physical Activity: Inactive (12/12/2020)   Exercise Vital Sign    Days of Exercise per Week: 0 days    Minutes of Exercise per Session: 0 min  Stress: Not on file  Social Connections: Not on file  Intimate Partner Violence: Not At Risk (07/12/2023)   Humiliation, Afraid, Rape, and Kick questionnaire    Fear of Current or Ex-Partner: No    Emotionally Abused: No    Physically Abused: No    Sexually Abused: No    Family History:     Family History  Problem Relation Age of Onset   Cancer Mother    Cancer Sister    Colon cancer Neg Hx      ROS:  Please see the history of present illness.  Review of Systems  Constitutional: Negative.  HENT: Negative.    Eyes: Negative.   Cardiovascular:  Positive for dyspnea on exertion.  Respiratory:  Positive for shortness of breath and wheezing.   Hematologic/Lymphatic: Negative.   Musculoskeletal: Negative.  Negative for joint pain.  Gastrointestinal: Negative.   Genitourinary: Negative.   Neurological: Negative.     All other ROS reviewed and negative.     Physical Exam/Data:   Vitals:   07/12/23 1059 07/12/23 1100 07/12/23 1144 07/12/23 1200  BP:  (!) 132/41 (!) 146/51 (!) 154/49  Pulse:  60 65 64  Resp:  (!) 24 (!) 28 (!) 27  Temp:   97.6 F (36.4 C)   TempSrc:   Oral   SpO2: 100% 100% 98% 99%  Weight:      Height:       No intake or output data in the 24 hours ending 07/12/23 1215    07/12/2023   10:38 AM 07/12/2023    8:01 AM 07/06/2023    9:00 AM  Last 3 Weights  Weight (lbs) 147 lb 0.8 oz 147 lb 11.3 oz 148 lb 12.8 oz  Weight (kg) 66.7 kg 67 kg 67.495 kg     Body mass index is 26.05 kg/m.  General:  Well nourished, well developed, in no acute distress  HEENT: normal Neck: no JVD Vascular:  No carotid bruits; Distal pulses 2+ bilaterally Cardiac:  normal S1, S2; RRR; 4/6 systolic murmur LSB Lungs:  decreased breath sounds with scattered wheezing  Abd: soft, nontender, no  hepatomegaly  Ext: no edema Musculoskeletal:  No deformities, BUE and BLE strength normal and equal Skin: warm and dry  Neuro:  CNs 2-12 intact, no focal abnormalities noted Psych:  Normal affect   EKG:  The EKG was personally reviewed and demonstrates:  NSR with lateral ST flattening Telemetry:  Telemetry was personally reviewed and demonstrates:  NSR  Relevant CV Studies: Limited echo 05/20/23  IMPRESSIONS     1. Limited Echo for LVEF assessment.   2. Left ventricular ejection fraction, by estimation, is 60 to 65%. The  left ventricle has normal function. The left ventricle has no regional  wall motion abnormalities. There is moderate left ventricular hypertrophy.  Left ventricular diastolic function   could not be evaluated.   3. Right ventricular systolic function is normal. The right ventricular  size is not well visualized. There is severely elevated pulmonary artery  systolic pressure.   4. The mitral valve is normal in structure. Mild mitral valve  regurgitation.   5. The tricuspid valve is abnormal. Tricuspid valve regurgitation is  moderate to severe.   6. The aortic valve was not assessed. Aortic valve regurgitation is not  visualized. Moderate aortic valve stenosis. Aortic valve Vmax measures  3.00 m/s.   7. The inferior vena cava is dilated in size with <50% respiratory  variability, suggesting right atrial pressure of 15 mmHg.   Comparison(s): Changes from prior study are noted. Functional moderate to  severe TR is new.   FINDINGS Laboratory Data:  High Sensitivity Troponin:   Recent Labs  Lab 07/12/23 0820 07/12/23 1001  TROPONINIHS 44* 2,997*     Chemistry Recent Labs  Lab 07/12/23 0820  NA 138  K 5.0  CL 102  CO2 27  GLUCOSE 183*  BUN 17  CREATININE 1.09*  CALCIUM 9.1  MG 2.0  GFRNONAA 49*  ANIONGAP 9    Recent Labs  Lab 07/12/23 0820  PROT 8.6*  ALBUMIN 3.6  AST 25  ALT 13  ALKPHOS 54  BILITOT 0.7   Lipids No results for  input(s): "CHOL", "TRIG", "HDL", "LABVLDL", "LDLCALC", "CHOLHDL" in the last 168 hours.  Hematology Recent Labs  Lab 07/06/23 0950 07/12/23 0820  WBC 2.2* 3.8*  RBC 3.12* 3.46*  HGB 9.5* 10.6*  HCT 33.2* 38.1  MCV 106.4* 110.1*  MCH 30.4 30.6  MCHC 28.6* 27.8*  RDW 18.5* 18.2*  PLT 57* 80*   Thyroid No results for input(s): "TSH", "FREET4" in the last 168 hours.  BNP Recent Labs  Lab 07/12/23 0820  BNP 571.0*    DDimer No results for input(s): "DDIMER" in the last 168 hours.   Radiology/Studies:  DG Chest Portable 1 View  Result Date: 07/12/2023 CLINICAL DATA:  89 year old female with difficulty breathing onset 0200 hours. Atrial fibrillation, lung cancer. EXAM: PORTABLE CHEST 1 VIEW COMPARISON:  Chest CTA 11/03/2022. Radiographs 06/09/2023 and earlier. FINDINGS: Portable AP upright view at 0838 hours. Emphysema demonstrated by CTA last year. Cardiomegaly. Calcified aortic atherosclerosis. Other mediastinal contours are within normal limits. Confluent and veiling bilateral lung base opacity appears only in part due to pleural effusions. There is some generalized increase in the pulmonary interstitium, vasculature appears indistinct. No pneumothorax. Visualized tracheal air column is within normal limits. No acute osseous abnormality identified. Chronic postoperative changes to the left axilla, chest wall. Paucity  of bowel gas in the visible abdomen. IMPRESSION: 1. Emphysema with confluent, indeterminate bibasilar opacity which seems only in part due to layering pleural effusions. Increased interstitium/vascularity elsewhere. Consider acute pulmonary edema and bilateral pleural effusions and atelectasis. Viral/atypical infection or lung base aspiration difficult to exclude. 2. Cardiomegaly, calcified aortic atherosclerosis. Electronically Signed   By: Odessa Fleming M.D.   On: 07/12/2023 09:05     Assessment and Plan:   NSTEMI with troponin >2000 in setting of acute respiratory failure.  NSTEMI 04/2023 trop 325 not a candidate for invasive ischemic w/u, antiplatelet or anticoag due to multiple comorbidities and chronic anemia. Denies any chest pain just dyspnea.  Medical management recommended.   Aucte on chronic hypoxic resp fuilure-CHF and COPD improving with BiPAP and IV Lasix 20 mg x1 given and now on 40 mg IV BID-probably can lower dose as she's comfortable now. No I/O's recorder yet.  Acute on chronic HFpEF -has been out of lasix due to confusion over meds per notes.   Moderate to Severe Aortic stenosis -not a candidate for surgery.  PAF not on anticog due to MDS and GI bleed. Managed with metoprolol  Severe Pulmonary HTN   Risk Assessment/Risk Scores:     TIMI Risk Score for Unstable Angina or Non-ST Elevation MI:   The patient's TIMI risk score is  , which indicates a  % risk of all cause mortality, new or recurrent myocardial infarction or need for urgent revascularization in the next 14 days.  New York Heart Association (NYHA) Functional Class NYHA Class IV  CHA2DS2-VASc Score = 6   This indicates a 9.7% annual risk of stroke. The patient's score is based upon: CHF History: 1 HTN History: 1 Diabetes History: 0 Stroke History: 0 Vascular Disease History: 1 Age Score: 2 Gender Score: 1         For questions or updates, please contact [facility] Please consult [email or web address] for contact info under    Signed, Jacolyn Reedy, PA-C  07/12/2023 12:15 PM  Attending note  Patient seen and discussed with PA Geni Bers, I agree with her documentation. 89 yo female history of PAF, mod to severe AS, myelodysplastic syndrome (anemia/thrombocytopenia), COPD on home O2, breast cancer s/p lumpectomy, lung cancer s/p LUL lobectomy, hyperkalemia, severe pulmonary HTN group II/III,    She was just discharged 04/09/23 after admission for NSTEMI peak trop 325.   Echocardiogram 6/24 showed normal LVEF, grade 2 DD, moderate to severe aortic valve stenosis (mean AV  gradient went up from 22 mmHg to 31 mmHg). Due to history of myelo-dysplastic syndrome with chronic anemia and thrombocytopenia, patient is not a candidate for any invasive ischemia evaluation. Not a candidate for antiplatelet or anticoagulation either   Repeat admit diastolic HF 05/2022, COPD exacerbation and elevated troponin also managed medically.   Admit later 05/2023 with afib high rates, difficult to control given intermittent bradycardia   Presented with progression SOB/DOE at home. From admit note sats 50% by EMS evaluation. Patient reportedly had not been taking her lasix at home.     Mg 2 BNP 571 (down from 1095 1 month ago)  K 5 Cr 1.09 BUN 17 WBC 3.8 Hgb 10.6 Plt 80 COVID/flu/RSV neg Trop 44-->2997--> EKG SR, V5/V6 ST depression increased from baseline CXR: Emphysema with confluent, indeterminate bibasilar opacity which seems only in part due to layering pleural effusions. Increased interstitium/vascularity elsewhere. Consider acute pulmonary edema and bilateral pleural effusions and atelectasis. Viral/atypical infection or lung base aspiration difficult to exclude. Echo:  LVEF 60-65%, no WMAs, grade II dd, mild to mod MR, mod to severe AS    1.NSTEMI - trop up to 2997 without clear peak, increased ST depression V5/V6 from baseline - echo stable LVEF 60-65%, no new WMAs - multiple prior admits with COPD/HF exacerbation with elevated trops (though not to this level) managed medically, deemed not a candidate for invasive procedures given myelodyplastic syndrome, concerns for possible GI bleed 04/2023 requiring transfusion (guiac + per primary team notes, not a canididate for endoscopy per GI), advanced age and comorbidities, poor functional capacity.   - presented with SOB/hypoxia in setting of COPD and HFpEF exacerbation. EMS sats reportedly in the 50%.  - continues not to be a cath candidate as previously concluded from several prior presentations - medically manage with heparin  48 hrs. Previously given anemia/thrombocytopenia/+hemoccult has not been on antiplatelets.  -platelets labile but have been above 50K last several checks, Hgb 10.6  will try adding ASA 81 mg daily and see how tolerated.  - already on beta blocker, limit dosing due to prior bradycardia.    2. Acute on chronic HFrEF - from H&P was not taking her lasix at home.  - CXR effusions, BNP 571 - started on IV lasix 40mg  bid, continue IV diuresis.   3.PAF - historically has been difficult to control given intermittent bradycardia - not on anticoag due to anemia/thrombocytopenia  4. Aortic stenosis - mod to severe - not a candidate for intervention as reported above  5. Severe pulmonary HTN - group II/III pulm HTN - managed lung and heart disease, no plans for additional testing as these etiologies would not be treated with pulmonary vasodilators.   Dina Rich MD

## 2023-07-12 NOTE — Progress Notes (Signed)
Patient transported from ER to ICU on NRB.  Patient placed back on BIPAP at this time and tolerating well.

## 2023-07-12 NOTE — Progress Notes (Signed)
Troponins uptrending from 44, 2,997, and now greater than 24,000. Patient is on heparin gtt. Lab states per protocol, they are not to draw anymore troponins in the next 24 hours. Dr. Sherryll Burger made aware.

## 2023-07-12 NOTE — Progress Notes (Signed)
Patient placed on BIPAP per MD order.  Patient tolerating well at this time.

## 2023-07-12 NOTE — H&P (Signed)
History and Physical    Lisa [name] BJY:[id number] DOB: 1934-04-16 DOA: 07/12/2023  PCP: [name], MD   Patient coming from: Home  Chief Complaint: Shortness of breath with hypoxemia  HPI: Lisa [name] is a 89 y.o. female with medical history significant for chronic HFpEF, CKD stage IIIb, severe pulmonary hypertension, moderate AS/mild MR, AAA, NSTEMI, TIA, paroxysmal atrial fibrillation-not on anticoagulation, hypothyroidism, and COPD with chronic hypoxemia 2-3 L baseline nasal cannula, who presented to the ED with worsening respiratory distress that began last night and worsened this morning.  Apparently, she has not been taking her Lasix 20 mg daily as prescribed due to confusion on instructions for how to take this medication.  Her son called 911 and EMS noted oxygen saturation in the 50th percentile on her usual baseline 2-3 L nasal cannula.  She denied any chest pain, fevers, chills, abdominal pain, nausea, or vomiting.  She was admitted for similar issues earlier this year and discharged 8/9. Weight has been 143lbs recently.   ED Course: Vital signs with some initial tachycardia but otherwise stable and afebrile.  Patient is currently on BiPAP and has been given breathing treatments as well as IV Lasix.  Chest x-ray showing significant bilateral pleural effusions.  Review of Systems: Reviewed as noted above, otherwise negative.  Past Medical History:  Diagnosis Date   AAA (abdominal aortic aneurysm) (HCC)    Adenocarcinoma of left lung (HCC) 2006   AF (paroxysmal atrial fibrillation) (HCC)    Aortic stenosis    Arthritis    Asthma    Cancer of breast, female (HCC)    Cancer of lung (HCC)    Dysrhythmia    Hypertension    Hypothyroidism    Invasive ductal carcinoma of left breast (HCC) 1999   Mitral regurgitation    Myocardial infarction (HCC)    Neutropenia (HCC) 06/09/2016   Personal history of radiation therapy    Pulmonary hypertension (HCC)     Past Surgical  History:  Procedure Laterality Date   BIOPSY  10/23/2020   Procedure: BIOPSY;  Surgeon: Benancio Deeds, MD;  Location: [facility];  Service: Gastroenterology;;   BREAST BIOPSY     left axillary node dissection   BREAST LUMPECTOMY Left    COLONOSCOPY  03/08/2003   YQM:VHQIONGEXB rectal polyps destroyed with the tip of the snare/Polyps at hepatic flexure, splenic flexure at 35 cm/Left-sided diverticula: unable to retrieve path   COLONOSCOPY  09/12/2008   MWU:XLKGMW rectum and distal sigmoid diminutive polyps/scattered left sided diverticulum. hyperplastic   COLONOSCOPY N/A 03/06/2013   NUU:VOZDGUY polyp-removed as described above; colonic diverticulosis. hyperplastic polyps. next TCS 03/2018   ESOPHAGOGASTRODUODENOSCOPY (EGD) WITH PROPOFOL N/A 10/23/2020   Procedure: ESOPHAGOGASTRODUODENOSCOPY (EGD) WITH PROPOFOL;  Surgeon: Benancio Deeds, MD;  Location: [facility] ENDOSCOPY;  Service: Gastroenterology;  Laterality: N/A;   FOOT SURGERY     LUNG REMOVAL, PARTIAL     upper lobe     reports that she quit smoking about 31 years ago. Her smoking use included cigarettes. She started smoking about 66 years ago. She has a 26.3 pack-year smoking history. She has never used smokeless tobacco. She reports that she does not drink alcohol and does not use drugs.  Allergies  Allergen Reactions   Meloxicam Other (See Comments)    Caused an injury to the kidneys, per nephrologist   Micardis Hct [Telmisartan-Hctz] Other (See Comments)    Caused an injury to the kidneys, per nephrologist    Family History  Problem Relation Age  of Onset   Cancer Mother    Cancer Sister    Colon cancer Neg Hx     Prior to Admission medications   Medication Sig Start Date End Date Taking? Authorizing Provider  acetaminophen (TYLENOL) 325 MG tablet Take 1 tablet (325 mg total) by mouth every 6 (six) hours as needed for mild pain (or Fever >/= 101). 06/11/23   Setzer, Lynnell Jude, PA-C  albuterol (VENTOLIN HFA) 108 (90  Base) MCG/ACT inhaler Inhale 1-2 puffs into the lungs every 4 (four) hours as needed for wheezing or shortness of breath. 05/25/22   [provider]  Fluticasone-Umeclidin-Vilant (TRELEGY ELLIPTA) 100-62.5-25 MCG/ACT AEPB Inhale 1 puff into the lungs daily. 12/18/22   Oretha Milch, MD  furosemide (LASIX) 20 MG tablet Take 1 tablet (20 mg total) by mouth daily. 04/09/23   Johnson, Clanford L, MD  isosorbide mononitrate (IMDUR) 60 MG 24 hr tablet Take 1 tablet (60 mg total) by mouth daily. 06/12/23   Setzer, Lynnell Jude, PA-C  levothyroxine (SYNTHROID) 100 MCG tablet Take 1 tablet (100 mcg total) by mouth daily before breakfast. 08/28/21   Johnson, Clanford L, MD  magnesium oxide (MAG-OX) 400 MG tablet Take 0.5 tablets (200 mg total) by mouth at bedtime. 06/11/23   Setzer, Lynnell Jude, PA-C  melatonin 3 MG TABS tablet Take 1 tablet (3 mg total) by mouth at bedtime. 06/11/23   Setzer, Lynnell Jude, PA-C  metoprolol succinate (TOPROL-XL) 25 MG 24 hr tablet TAKE 1 TABLET DAILY 02/18/23   Mallipeddi, Vishnu P, MD  mirtazapine (REMERON) 15 MG tablet Take 1 tablet (15 mg total) by mouth at bedtime. 06/11/23   Setzer, Lynnell Jude, PA-C  Multiple Vitamin (MULTI-VITAMIN) tablet Take 1 tablet by mouth daily.    [provider]  pantoprazole (PROTONIX) 40 MG tablet Take 1 tablet (40 mg total) by mouth 2 (two) times daily. 04/09/23   Johnson, Clanford L, MD  pravastatin (PRAVACHOL) 10 MG tablet Take 1 tablet (10 mg total) by mouth every evening. 04/09/23   Johnson, Clanford L, MD  predniSONE (DELTASONE) 10 MG tablet Take 1 tablet (10 mg total) by mouth daily with breakfast. 06/11/23   Setzer, Lynnell Jude, PA-C  psyllium (HYDROCIL/METAMUCIL) 95 % PACK Take 1 packet by mouth daily. 06/11/23   Setzer, Lynnell Jude, PA-C  senna (SENOKOT) 8.6 MG TABS tablet Take 1 tablet (8.6 mg total) by mouth at bedtime. 06/11/23   Milinda Antis, PA-C    Physical Exam: Vitals:   07/12/23 0801 07/12/23 0824  Pulse:  84  Resp:  (!) 27  SpO2:  96%   Weight: 67 kg   Height: 5\' 3"  (1.6 m)     Constitutional: NAD, calm, comfortable Vitals:   07/12/23 0801 07/12/23 0824  Pulse:  84  Resp:  (!) 27  SpO2:  96%  Weight: 67 kg   Height: 5\' 3"  (1.6 m)    Eyes: lids and conjunctivae normal Neck: normal, supple Respiratory: clear to auscultation bilaterally. Normal respiratory effort. No accessory muscle use. Bipap 60% Cardiovascular: Regular rate and rhythm, no murmurs. Abdomen: no tenderness, no distention. Bowel sounds positive.  Musculoskeletal:  No edema. Skin: no rashes, lesions, ulcers.  Psychiatric: Flat affect  Labs on Admission: I have personally reviewed following labs and imaging studies  CBC: Recent Labs  Lab 07/06/23 0950 07/12/23 0820  WBC 2.2* 3.8*  NEUTROABS 0.1* 1.3*  HGB 9.5* 10.6*  HCT 33.2* 38.1  MCV 106.4* 110.1*  PLT 57* 80*   Basic Metabolic  Panel: Recent Labs  Lab 07/12/23 0820  NA 138  K 5.0  CL 102  CO2 27  GLUCOSE 183*  BUN 17  CREATININE 1.09*  CALCIUM 9.1  MG 2.0   GFR: Estimated Creatinine Clearance: 33.4 mL/min (A) (by C-G formula based on SCr of 1.09 mg/dL (H)). Liver Function Tests: Recent Labs  Lab 07/12/23 0820  AST 25  ALT 13  ALKPHOS 54  BILITOT 0.7  PROT 8.6*  ALBUMIN 3.6   No results for input(s): "LIPASE", "AMYLASE" in the last 168 hours. No results for input(s): "AMMONIA" in the last 168 hours. Coagulation Profile: No results for input(s): "INR", "PROTIME" in the last 168 hours. Cardiac Enzymes: No results for input(s): "CKTOTAL", "CKMB", "CKMBINDEX", "TROPONINI" in the last 168 hours. BNP (last 3 results) No results for input(s): "PROBNP" in the last 8760 hours. HbA1C: No results for input(s): "HGBA1C" in the last 72 hours. CBG: No results for input(s): "GLUCAP" in the last 168 hours. Lipid Profile: No results for input(s): "CHOL", "HDL", "LDLCALC", "TRIG", "CHOLHDL", "LDLDIRECT" in the last 72 hours. Thyroid Function Tests: No results for input(s):  "TSH", "T4TOTAL", "FREET4", "T3FREE", "THYROIDAB" in the last 72 hours. Anemia Panel: No results for input(s): "VITAMINB12", "FOLATE", "FERRITIN", "TIBC", "IRON", "RETICCTPCT" in the last 72 hours. Urine analysis:    Component Value Date/Time   COLORURINE YELLOW 05/31/2023 0151   APPEARANCEUR CLEAR 05/31/2023 0151   LABSPEC 1.008 05/31/2023 0151   PHURINE 5.0 05/31/2023 0151   GLUCOSEU NEGATIVE 05/31/2023 0151   HGBUR SMALL (A) 05/31/2023 0151   BILIRUBINUR NEGATIVE 05/31/2023 0151   KETONESUR NEGATIVE 05/31/2023 0151   PROTEINUR NEGATIVE 05/31/2023 0151   NITRITE NEGATIVE 05/31/2023 0151   LEUKOCYTESUR NEGATIVE 05/31/2023 0151    Radiological Exams on Admission: DG Chest Portable 1 View  Result Date: 07/12/2023 CLINICAL DATA:  89 year old female with difficulty breathing onset 0200 hours. Atrial fibrillation, lung cancer. EXAM: PORTABLE CHEST 1 VIEW COMPARISON:  Chest CTA 11/03/2022. Radiographs 06/09/2023 and earlier. FINDINGS: Portable AP upright view at 0838 hours. Emphysema demonstrated by CTA last year. Cardiomegaly. Calcified aortic atherosclerosis. Other mediastinal contours are within normal limits. Confluent and veiling bilateral lung base opacity appears only in part due to pleural effusions. There is some generalized increase in the pulmonary interstitium, vasculature appears indistinct. No pneumothorax. Visualized tracheal air column is within normal limits. No acute osseous abnormality identified. Chronic postoperative changes to the left axilla, chest wall. Paucity of bowel gas in the visible abdomen. IMPRESSION: 1. Emphysema with confluent, indeterminate bibasilar opacity which seems only in part due to layering pleural effusions. Increased interstitium/vascularity elsewhere. Consider acute pulmonary edema and bilateral pleural effusions and atelectasis. Viral/atypical infection or lung base aspiration difficult to exclude. 2. Cardiomegaly, calcified aortic atherosclerosis.  Electronically Signed   By: Odessa Fleming M.D.   On: 07/12/2023 09:05    EKG: Independently reviewed. ST 102 bpm.  Assessment/Plan Principal Problem:   Acute on chronic hypoxic respiratory failure (HCC) Active Problems:   Myelodysplastic syndrome (HCC)   Hypothyroidism   COPD with acute exacerbation (HCC)   Abdominal aortic aneurysm   HTN (hypertension)   Anemia of chronic disease   Acute on chronic diastolic CHF (congestive heart failure) (HCC)   Pancytopenia (HCC)   Mitral regurgitation   CAD (coronary artery disease)   History of non-ST elevation myocardial infarction (NSTEMI)   CMML (chronic myelomonocytic leukemia) (HCC)    Acute on chronic hypoxemic respiratory failure-multifactorial -Likely related to heart failure exacerbation as well as some mild component of  COPD -Continue BiPAP and wean as tolerated to baseline 2-3 L nasal cannula -Diuresis and steroids as well as breathing treatments as noted below  Acute on chronic HFpEF with bilateral pleural effusion -Cardiology note from 8/22 recommending furosemide 20 mg daily, however confused on instructions and patient has not been taking Lasix -Not a candidate for SGLT2 inhibitor given infection issues and hyperkalemia -Weight at baseline appears to be 146 pounds -Continue daily weights as well as strict I's and O's and Lasix IV twice daily for diuresis -Consider thoracentesis for bilateral pleural effusion if not improving  Acute COPD exacerbation -DuoNebs every 6 hours -Pulmicort twice daily -Continue IV Solu-Medrol  Severe pulmonary hypertension -Appears to be group 2 with recommendation to continue cardiac and lung conditions  CKD stage IIIb -Continue to monitor creatinine carefully with diuresis and appears to be at baseline  Paroxysmal atrial fibrillation -None noted to be a candidate for OAC -Continue metoprolol -Follows with Dr. Ancil Boozer as her cardiologist  Aortic stenosis/mitral regurgitation -Not a  candidate for valvular surgery due to myelodysplastic syndrome  MDS/CMML with neutropenia and thrombocytopenia -Follow up with Dr. [name] outpatient  Hypothyroidism -Continue levothyroxine  Prior TIA and NSTEMI -Managed medically with statin -Continue metoprolol and Imdur -Not on antiplatelets or anticoagulation due to prior GI bleed  4.8 cm AAA -Not noted to be a surgical candidate by vascular surgery    DVT prophylaxis: SCDs Code Status: Full Family Communication: Discussed with daughter on phone 9/9 Disposition Plan: Admit for acute hypoxemic respiratory failure treatment Consults called:None Admission status: Inpatient, SDU  Severity of Illness: The appropriate patient status for this patient is INPATIENT. Inpatient status is judged to be reasonable and necessary in order to provide the required intensity of service to ensure the patient's safety. The patient's presenting symptoms, physical exam findings, and initial radiographic and laboratory data in the context of their chronic comorbidities is felt to place them at high risk for further clinical deterioration. Furthermore, it is not anticipated that the patient will be medically stable for discharge from the hospital within 2 midnights of admission.   * I certify that at the point of admission it is my clinical judgment that the patient will require inpatient hospital care spanning beyond 2 midnights from the point of admission due to high intensity of service, high risk for further deterioration and high frequency of surveillance required.*   Lisa [name] D Lisa Underhill DO [facility]  If 7PM-7AM, please contact night-coverage [email or web address]  07/12/2023, 9:56 AM

## 2023-07-12 NOTE — Consult Note (Signed)
Pharmacy Consult Note - Anticoagulation  Pharmacy Consult for heparin Indication: chest pain/ACS  PATIENT MEASUREMENTS: Height: 5\' 3"  (160 cm) Weight: 66.7 kg (147 lb 0.8 oz) IBW/kg (Calculated) : 52.4 HEPARIN DW (KG): 65.9  VITAL SIGNS: Temp: 98.1 F (36.7 C) (09/09 1921) Temp Source: Oral (09/09 1921) BP: 158/58 (09/09 2100) Pulse Rate: 83 (09/09 2100)  Recent Labs    07/12/23 0820 07/12/23 1001 07/12/23 1259 07/12/23 2151  HGB 10.6*  --   --   --   HCT 38.1  --   --   --   PLT 80*  --   --   --   APTT 33  --   --   --   LABPROT 15.7*  --   --   --   INR 1.2  --   --   --   HEPARINUNFRC  --   --   --  0.48  CREATININE 1.09*  --   --   --   TROPONINIHS 44*   < > >24,000*  --    < > = values in this interval not displayed.    Estimated Creatinine Clearance: 33.4 mL/min (A) (by C-G formula based on SCr of 1.09 mg/dL (H)).  PAST MEDICAL HISTORY: Past Medical History:  Diagnosis Date   AAA (abdominal aortic aneurysm) (HCC)    Adenocarcinoma of left lung (HCC) 2006   AF (paroxysmal atrial fibrillation) (HCC)    Aortic stenosis    Arthritis    Asthma    Cancer of breast, female (HCC)    Cancer of lung (HCC)    Dysrhythmia    Hypertension    Hypothyroidism    Invasive ductal carcinoma of left breast (HCC) 1999   Mitral regurgitation    Myocardial infarction (HCC)    Neutropenia (HCC) 06/09/2016   Personal history of radiation therapy    Pulmonary hypertension (HCC)     ASSESSMENT: 89 y.o. female with PMH HFpEF, CKD3, severe pulmonary HTN, VHD, AAA, NSTEMI, TIA, pAF (not on AC d/t history of anemia and thrombocytopenia) is presenting with ACS. Patient brought in by EMS satting 59% on 3 L with diaphoresis. cTn jumped from 44 to 2,997. Patient is not on chronic anticoagulation per chart review. Pharmacy has been consulted to  manage heparin intravenous infusion.  -heparin level at goal on 750 units/hr  Goal(s) of therapy: Heparin level 0.3 - 0.7  units/mL Monitor platelets by anticoagulation protocol: Yes    PLAN: -Continue heparin at 750 units/hr -Daily heparin level and CBC  Harland German, PharmD Clinical Pharmacist **Pharmacist phone directory can now be found on [email or web address] ([email or web address]).  Listed under [facility].

## 2023-07-12 NOTE — Consult Note (Signed)
Pharmacy Consult Note - Anticoagulation  Pharmacy Consult for heparin Indication: chest pain/ACS  PATIENT MEASUREMENTS: Height: 5\' 3"  (160 cm) Weight: 66.7 kg (147 lb 0.8 oz) IBW/kg (Calculated) : 52.4 HEPARIN DW (KG): 65.9  VITAL SIGNS: BP: 123/81 (09/09 1040) Pulse Rate: 60 (09/09 1100)  Recent Labs    07/12/23 0820 07/12/23 1001  HGB 10.6*  --   HCT 38.1  --   PLT 80*  --   CREATININE 1.09*  --   TROPONINIHS 44* 2,997*    Estimated Creatinine Clearance: 33.4 mL/min (A) (by C-G formula based on SCr of 1.09 mg/dL (H)).  PAST MEDICAL HISTORY: Past Medical History:  Diagnosis Date   AAA (abdominal aortic aneurysm) (HCC)    Adenocarcinoma of left lung (HCC) 2006   AF (paroxysmal atrial fibrillation) (HCC)    Aortic stenosis    Arthritis    Asthma    Cancer of breast, female (HCC)    Cancer of lung (HCC)    Dysrhythmia    Hypertension    Hypothyroidism    Invasive ductal carcinoma of left breast (HCC) 1999   Mitral regurgitation    Myocardial infarction (HCC)    Neutropenia (HCC) 06/09/2016   Personal history of radiation therapy    Pulmonary hypertension (HCC)     ASSESSMENT: 89 y.o. female with PMH HFpEF, CKD3, severe pulmonary HTN, VHD, AAA, NSTEMI, TIA, pAF (not on AC d/t history of anemia and thrombocytopenia) is presenting with ACS. Patient brought in by EMS satting 59% on 3 L with diaphoresis. cTn jumped from 44 to 2,997. Patient is not on chronic anticoagulation per chart review. Pharmacy has been consulted to initiate and manage heparin intravenous infusion.  Pertinent medications: No chronic AC PTA per chart review  Goal(s) of therapy: Heparin level 0.3 - 0.7 units/mL Monitor platelets by anticoagulation protocol: Yes   Baseline anticoagulation labs: Recent Labs    07/12/23 0820  HGB 10.6*  PLT 80*    Date Time aPTT/HL Rate/Comment     PLAN: Give 4000 units bolus x1; then start heparin infusion at 750 units/hour. Check heparin level in 8  hours, then daily once at least two levels are consecutively therapeutic. Monitor CBC daily while on heparin infusion.   Will M. [name], PharmD Clinical Pharmacist 07/12/2023 11:12 AM

## 2023-07-12 NOTE — ED Triage Notes (Signed)
Pt reports she began to struggle to breathe around 2am. She decided to wait it out to see if it would improve. When EMS arrived pt was 59% on 3L, tachycardic at 107, and diaphoretic. Pt is alert and oriented x4

## 2023-07-12 NOTE — Progress Notes (Signed)
*  PRELIMINARY RESULTS* Echocardiogram 2D Echocardiogram has been performed.  Lisa [name] 07/12/2023, 12:56 PM

## 2023-07-12 NOTE — Progress Notes (Signed)
Grandson called for update on patient. Update given. Lisa [name] states he is POA. Copy requested for chart.

## 2023-07-12 NOTE — Plan of Care (Signed)

## 2023-07-13 ENCOUNTER — Inpatient Hospital Stay: Payer: Medicare Other

## 2023-07-13 DIAGNOSIS — I480 Paroxysmal atrial fibrillation: Secondary | ICD-10-CM | POA: Diagnosis not present

## 2023-07-13 DIAGNOSIS — J9621 Acute and chronic respiratory failure with hypoxia: Secondary | ICD-10-CM | POA: Diagnosis not present

## 2023-07-13 DIAGNOSIS — I5033 Acute on chronic diastolic (congestive) heart failure: Secondary | ICD-10-CM

## 2023-07-13 DIAGNOSIS — Z7189 Other specified counseling: Secondary | ICD-10-CM

## 2023-07-13 DIAGNOSIS — I214 Non-ST elevation (NSTEMI) myocardial infarction: Secondary | ICD-10-CM | POA: Diagnosis not present

## 2023-07-13 LAB — CBC
HCT: 32.8 % — ABNORMAL LOW (ref 36.0–46.0)
Hemoglobin: 9.6 g/dL — ABNORMAL LOW (ref 12.0–15.0)
MCH: 30.3 pg (ref 26.0–34.0)
MCHC: 29.3 g/dL — ABNORMAL LOW (ref 30.0–36.0)
MCV: 103.5 fL — ABNORMAL HIGH (ref 80.0–100.0)
Platelets: 90 10*3/uL — ABNORMAL LOW (ref 150–400)
RBC: 3.17 MIL/uL — ABNORMAL LOW (ref 3.87–5.11)
RDW: 17.4 % — ABNORMAL HIGH (ref 11.5–15.5)
WBC: 1.3 10*3/uL — CL (ref 4.0–10.5)
nRBC: 2.3 % — ABNORMAL HIGH (ref 0.0–0.2)

## 2023-07-13 LAB — COMPREHENSIVE METABOLIC PANEL
ALT: 18 U/L (ref 0–44)
AST: 95 U/L — ABNORMAL HIGH (ref 15–41)
Albumin: 3.4 g/dL — ABNORMAL LOW (ref 3.5–5.0)
Alkaline Phosphatase: 40 U/L (ref 38–126)
Anion gap: 9 (ref 5–15)
BUN: 28 mg/dL — ABNORMAL HIGH (ref 8–23)
CO2: 26 mmol/L (ref 22–32)
Calcium: 8.7 mg/dL — ABNORMAL LOW (ref 8.9–10.3)
Chloride: 98 mmol/L (ref 98–111)
Creatinine, Ser: 1.07 mg/dL — ABNORMAL HIGH (ref 0.44–1.00)
GFR, Estimated: 50 mL/min — ABNORMAL LOW (ref >60)
Glucose, Bld: 131 mg/dL — ABNORMAL HIGH (ref 70–99)
Potassium: 5.2 mmol/L — ABNORMAL HIGH (ref 3.5–5.1)
Sodium: 133 mmol/L — ABNORMAL LOW (ref 135–145)
Total Bilirubin: 0.9 mg/dL (ref 0.3–1.2)
Total Protein: 8.3 g/dL — ABNORMAL HIGH (ref 6.5–8.1)

## 2023-07-13 LAB — HEPARIN LEVEL (UNFRACTIONATED): Heparin Unfractionated: 0.41 [IU]/mL (ref 0.30–0.70)

## 2023-07-13 LAB — LIPID PANEL
Cholesterol: 145 mg/dL (ref 0–200)
HDL: 49 mg/dL (ref >40)
LDL Cholesterol: 86 mg/dL (ref 0–99)
Total CHOL/HDL Ratio: 3 ratio
Triglycerides: 51 mg/dL (ref <150)
VLDL: 10 mg/dL (ref 0–40)

## 2023-07-13 LAB — MAGNESIUM: Magnesium: 1.6 mg/dL — ABNORMAL LOW (ref 1.7–2.4)

## 2023-07-13 MED ORDER — AMIODARONE HCL 200 MG PO TABS
200.0000 mg | ORAL_TABLET | Freq: Two times a day (BID) | ORAL | Status: DC
  Administered 2023-07-13 – 2023-07-15 (×5): 200 mg via ORAL
  Filled 2023-07-13 (×5): qty 1

## 2023-07-13 MED ORDER — SODIUM ZIRCONIUM CYCLOSILICATE 10 G PO PACK
10.0000 g | PACK | Freq: Once | ORAL | Status: AC
  Administered 2023-07-13: 10 g via ORAL
  Filled 2023-07-13: qty 1

## 2023-07-13 MED ORDER — HYDRALAZINE HCL 20 MG/ML IJ SOLN
10.0000 mg | Freq: Four times a day (QID) | INTRAMUSCULAR | Status: DC | PRN
  Administered 2023-07-13: 10 mg via INTRAVENOUS
  Filled 2023-07-13: qty 1

## 2023-07-13 MED ORDER — IPRATROPIUM-ALBUTEROL 0.5-2.5 (3) MG/3ML IN SOLN: 3.0000 mL | Freq: Four times a day (QID) | RESPIRATORY_TRACT | Status: DC | PRN

## 2023-07-13 MED ORDER — PRAVASTATIN SODIUM 40 MG PO TABS
40.0000 mg | ORAL_TABLET | Freq: Every evening | ORAL | Status: DC
  Administered 2023-07-13 – 2023-07-14 (×2): 40 mg via ORAL
  Filled 2023-07-13 (×2): qty 1

## 2023-07-13 MED ORDER — PREDNISONE 20 MG PO TABS
40.0000 mg | ORAL_TABLET | Freq: Every day | ORAL | Status: DC
  Administered 2023-07-13: 40 mg via ORAL
  Filled 2023-07-13: qty 2

## 2023-07-13 MED ORDER — MAGNESIUM SULFATE 2 GM/50ML IV SOLN
2.0000 g | Freq: Once | INTRAVENOUS | Status: AC
  Administered 2023-07-13: 2 g via INTRAVENOUS
  Filled 2023-07-13: qty 50

## 2023-07-13 MED ORDER — IPRATROPIUM-ALBUTEROL 0.5-2.5 (3) MG/3ML IN SOLN
3.0000 mL | Freq: Three times a day (TID) | RESPIRATORY_TRACT | Status: DC
  Administered 2023-07-13: 3 mL via RESPIRATORY_TRACT
  Filled 2023-07-13: qty 3

## 2023-07-13 NOTE — TOC Initial Note (Signed)
Transition of Care [facility]) - Initial/Assessment Note    Patient Details  Name: Lisa [name] MRN: [id number] Date of Birth: [birth year]  Transition of [facility]) CM/SW Contact:    Elliot Gault, LCSW Phone Number: 07/13/2023, 11:16 AM  Clinical Narrative:                  Pt admitted from home. Pt known to TOC from previous admission. She has a high readmission risk score.  Spoke with pt's daughter, Steward Drone, for assessment. She and pt live together. Pt is active with [facility]. She has all DME and Steward Drone takes pt to appointments and gets her medications.  Steward Drone anticipating pt will return home with continued HHPT at dc.  TOC will follow.  Expected Discharge Plan: Home w Home Health Services Barriers to Discharge: Continued Medical Work up   Patient Goals and CMS Choice Patient states their goals for this hospitalization and ongoing recovery are:: return home          Expected Discharge Plan and Services In-house Referral: Clinical Social Work   Post Acute Care Choice: Resumption of Svcs/PTA Provider Living arrangements for the past 2 months: Single Family Home                                      Prior Living Arrangements/Services Living arrangements for the past 2 months: Single Family Home Lives with:: Adult Children Patient language and need for interpreter reviewed:: Yes Do you feel safe going back to the place where you live?: Yes      Need for Family Participation in Patient Care: Yes (Comment) Care giver support system in place?: Yes (comment) Current home services: DME, Home PT Criminal Activity/Legal Involvement Pertinent to Current Situation/Hospitalization: No - Comment as needed  Activities of Daily Living Home Assistive Devices/Equipment: Environmental consultant (specify type), Shower chair with back, Eyeglasses, Crutches, Oxygen, Bedside commode/3-in-1 ADL Screening (condition at time of admission) Patient's cognitive ability adequate to safely complete  daily activities?: Yes Is the patient deaf or have difficulty hearing?: No Does the patient have difficulty seeing, even when wearing glasses/contacts?: No Does the patient have difficulty concentrating, remembering, or making decisions?: Yes Patient able to express need for assistance with ADLs?: Yes Does the patient have difficulty dressing or bathing?: Yes Independently performs ADLs?: No Communication: Independent Dressing (OT): Needs assistance Is this a change from baseline?: Change from baseline, expected to last <3days Grooming: Needs assistance Is this a change from baseline?: Change from baseline, expected to last <3 days Feeding: Independent Bathing: Needs assistance Is this a change from baseline?: Pre-admission baseline Toileting: Needs assistance Is this a change from baseline?: Change from baseline, expected to last <3 days In/Out Bed: Needs assistance Is this a change from baseline?: Change from baseline, expected to last <3 days Walks in Home: Independent with device (comment) Does the patient have difficulty walking or climbing stairs?: Yes Weakness of Legs: Both Weakness of Arms/Hands: Both  Permission Sought/Granted                  Emotional Assessment Appearance:: Appears stated age   Affect (typically observed): Accepting Orientation: : Oriented to Self, Oriented to Place, Oriented to  Time, Oriented to Situation Alcohol / Substance Use: Not Applicable Psych Involvement: No (comment)  Admission diagnosis:  Acute hypoxemic respiratory failure (HCC) [J96.01] Chronic obstructive pulmonary disease, unspecified COPD type (HCC) [J44.9] Congestive heart failure, unspecified HF chronicity,  unspecified heart failure type (HCC) [I50.9] Acute on chronic hypoxic respiratory failure (HCC) [J96.21] Patient Active Problem List   Diagnosis Date Noted   Acute on chronic hypoxic respiratory failure (HCC) 07/12/2023   CMML (chronic myelomonocytic leukemia) (HCC)  07/06/2023   CAP (community acquired pneumonia) 05/20/2023   CAD (coronary artery disease) 05/20/2023   History of non-ST elevation myocardial infarction (NSTEMI) 05/20/2023   Metabolic acidosis 05/20/2023   Elevated brain natriuretic peptide (BNP) level 05/20/2023   Fever, unspecified 05/20/2023   Altered mental status 05/20/2023   NSTEMI (non-ST elevated myocardial infarction) (HCC) 04/06/2023   Nonrheumatic aortic valve stenosis 11/20/2022   Mitral regurgitation 11/20/2022   Diarrhea 11/07/2022   Pancytopenia (HCC) 11/03/2022   Hyperkalemia 11/03/2022   Acute on chronic diastolic CHF (congestive heart failure) (HCC) 02/11/2022   TIA (transient ischemic attack) 02/10/2022   Myelodysplastic syndrome (HCC) 05/14/2021   Hyponatremia    Labile blood pressure    Debility 10/30/2020   Anemia of chronic disease    Paroxysmal atrial fibrillation (HCC)    Malnutrition of moderate degree 10/28/2020   Bilateral knee effusions    FTT (failure to thrive) in adult    Decreased appetite    Weakness    Gout 10/21/2020   Hypothyroidism 10/21/2020   HTN (hypertension) 10/21/2020   Weight loss 10/21/2020   Generalized muscle weakness 10/21/2020   Osteoarthritis 10/21/2020   Atrial fibrillation with RVR (HCC) 10/21/2020   Deficiency anemia 03/13/2019   Chronic respiratory failure with hypoxia (HCC) 05/31/2017   Hyperglycemia 05/31/2017   Chronic renal failure (CRF), stage 3b (HCC) 05/31/2017   Normocytic anemia 05/31/2017   COPD with acute exacerbation (HCC) 05/31/2017   Abdominal aortic aneurysm 05/31/2017   Urinary incontinence 05/31/2017   Vitamin D deficiency 06/10/2016   Neutropenia (HCC) 06/09/2016   Invasive ductal carcinoma of left breast (HCC) 06/09/2016   Adenocarcinoma of left lung (HCC) 06/09/2016   Malignant neoplasm of unspecified site of unspecified female breast (HCC) 06/09/2016   Adenomatous polyps 02/08/2013   PCP:  [name], MD Pharmacy:   [facility] -  [place] [place] - [address]  [place] [address] Phone: [phone] Fax: [phone]     Social Determinants of Health (SDOH) Social History: SDOH Screenings   Food Insecurity: No Food Insecurity (07/12/2023)  Housing: Low Risk  (07/12/2023)  Transportation Needs: No Transportation Needs (07/12/2023)  Utilities: Not At Risk (07/12/2023)  Depression (PHQ2-9): Low Risk  (12/12/2020)  Physical Activity: Inactive (12/12/2020)  Tobacco Use: Medium Risk (07/12/2023)   SDOH Interventions:     Readmission Risk Interventions    07/13/2023   11:15 AM 05/24/2023    2:10 PM 08/28/2021    1:06 PM  Readmission Risk Prevention Plan  Transportation Screening Complete Complete Complete  HRI or Home Care Consult  Complete Complete  Social Work Consult for Recovery Care Planning/Counseling  Complete Complete  Palliative Care Screening  Complete Not Applicable  Medication Review Oceanographer) Complete Complete Complete  HRI or Home Care Consult Complete    SW Recovery Care/Counseling Consult Complete    Palliative Care Screening Not Applicable    Skilled Nursing Facility Not Applicable

## 2023-07-13 NOTE — Progress Notes (Signed)
Progress Note  Patient Name: Lisa [name] Date of Encounter: 07/13/2023  Primary Cardiologist: Marjo Bicker, MD  Interval Summary   No shortness of breath at rest or active chest pain, denies palpitations.  Vital Signs    Vitals:   07/13/23 0717 07/13/23 0718 07/13/23 0729 07/13/23 0800  BP: (!) 151/64   (!) 106/54  Pulse:  72  83  Resp:  (!) 23    Temp:      TempSrc:      SpO2:  98% 100% 100%  Weight:      Height:        Intake/Output Summary (Last 24 hours) at 07/13/2023 0902 Last data filed at 07/13/2023 0404 Gross per 24 hour  Intake 640.14 ml  Output 1100 ml  Net -459.86 ml   Filed Weights   07/12/23 0801 07/12/23 1038 07/13/23 0403  Weight: 67 kg 66.7 kg 67.1 kg    Physical Exam   GEN: No acute distress.   Neck: No JVD. Cardiac: RRR, 3/6 murmur, no gallop.  Respiratory: Nonlabored.  Decreased breath sounds particular at the bases. GI: Soft, nontender, bowel sounds present. MS: No edema. Neuro:  Nonfocal. Psych: Alert and oriented x 3. Normal affect.  ECG/Telemetry    An ECG dated 07/12/2023 was personally reviewed today and demonstrated:  Sinus rhythm with LVH and repolarization abnormalities with associated ischemic ST changes.  Telemetry shows sinus rhythm with bursts of atrial fibrillation.  Labs    Chemistry Recent Labs  Lab 07/12/23 0820 07/13/23 0426  NA 138 133*  K 5.0 5.2*  CL 102 98  CO2 27 26  GLUCOSE 183* 131*  BUN 17 28*  CREATININE 1.09* 1.07*  CALCIUM 9.1 8.7*  PROT 8.6* 8.3*  ALBUMIN 3.6 3.4*  AST 25 95*  ALT 13 18  ALKPHOS 54 40  BILITOT 0.7 0.9  GFRNONAA 49* 50*  ANIONGAP 9 9    Hematology Recent Labs  Lab 07/06/23 0950 07/12/23 0820 07/13/23 0426  WBC 2.2* 3.8* 1.3*  RBC 3.12* 3.46* 3.17*  HGB 9.5* 10.6* 9.6*  HCT 33.2* 38.1 32.8*  MCV 106.4* 110.1* 103.5*  MCH 30.4 30.6 30.3  MCHC 28.6* 27.8* 29.3*  RDW 18.5* 18.2* 17.4*  PLT 57* 80* 90*   Cardiac Enzymes Recent Labs  Lab 07/12/23 0820  07/12/23 1001 07/12/23 1259  TROPONINIHS 44* 2,997* >24,000*   Lipid Panel     Component Value Date/Time   CHOL 145 07/13/2023 0426   TRIG 51 07/13/2023 0426   HDL 49 07/13/2023 0426   CHOLHDL 3.0 07/13/2023 0426   VLDL 10 07/13/2023 0426   LDLCALC 86 07/13/2023 0426    Cardiac Studies   Echocardiogram 07/12/2023:  1. Left ventricular ejection fraction, by estimation, is 60 to 65%. The  left ventricle has normal function. The left ventricle has no regional  wall motion abnormalities. There is moderate left ventricular hypertrophy.  Left ventricular diastolic  parameters are consistent with Grade II diastolic dysfunction  (pseudonormalization). Elevated left atrial pressure.   2. Right ventricular systolic function is normal. The right ventricular  size is mildly enlarged. Tricuspid regurgitation signal is inadequate for  assessing PA pressure.   3. Left atrial size was severely dilated.   4. Right atrial size was mildly dilated.   5. The mitral valve is abnormal. Mild to moderate mitral valve  regurgitation. No evidence of mitral stenosis.   6. The tricuspid valve is abnormal.   7. The aortic valve is tricuspid. There is severe calcifcation  of the  aortic valve. There is severe thickening of the aortic valve. Aortic valve  regurgitation is not visualized. Moderate to severe aortic valve stenosis.  Aortic valve mean gradient  measures 22.0 mmHg. Aortic valve peak gradient measures 41.9 mmHg. Aortic  valve area, by VTI measures 0.81 cm.   8. The inferior vena cava is normal in size with <50% respiratory  variability, suggesting right atrial pressure of 8 mmHg.   Assessment & Plan   1.  NSTEMI, peak high-sensitivity troponin I now greater than 24,000.  No active chest pain or shortness of breath.  ECG from yesterday shows sinus rhythm with ST segment changes progressed from prior repolarization abnormalities more consistent with ischemia.  Follow-up echocardiogram demonstrates  normal LVEF at 60 to 65% without regional wall motion abnormalities.  Per chart review and recent cardiology consultation, plan is medical therapy as she is not an optimal candidate for invasive cardiac assessment and PCI given high bleeding risk.  2.  Paroxysmal atrial fibrillation with CHA2DS2-VASc score of 6.  Patient has not been anticoagulated with concerns for high bleeding risk in the setting of known MDS and documented GI bleeding.  Also not optimal candidate for Watchman implantation.  She continues to have bursts of atrial fibrillation.  3.  Degenerative calcific aortic stenosis, moderate to severe with mean AV gradient 22 mmHg and dimensionless index 0.26.  4.  HFpEF, LVEF 60 to 65% with moderate LVH and grade 2 diastolic dysfunction.  Initially diuresed with IV Lasix.  ReDS clip measurement this morning 23 and diuretics held for now.  5.  Known pulmonary hypertension, likely WHO group 2 and 3.  Chart reviewed, discussed with patient and daughter at bedside.  Continue IV heparin for at least 48 hours.  She is on aspirin 81 mg daily and tolerating so far without obvious bleeding.  Also on Imdur, Toprol-XL, and Pravachol.  LDL 86 by follow-up lab work, will uptitrate dose of Pravachol as tolerated.  Also plan to start low-dose amiodarone load orally to better suppress atrial fibrillation as RVR remains a trigger for ischemia.  For questions or updates, please contact [facility] Please consult [email or web address] for contact info under   Signed, Nona Dell, MD  07/13/2023, 9:02 AM

## 2023-07-13 NOTE — Plan of Care (Signed)

## 2023-07-13 NOTE — Progress Notes (Signed)
   07/13/23 0800  ReDS Vest / Clip  Station Marker A  Ruler Value 32  ReDS Value Range < 36  ReDS Actual Value 23

## 2023-07-13 NOTE — Progress Notes (Signed)
PROGRESS NOTE    Lisa [name]  NWG:[id number] DOB: 1934/01/20 DOA: 07/12/2023 PCP: [name], MD   Brief Narrative:    Lisa [name] is a 89 y.o. female with medical history significant for chronic HFpEF, CKD stage IIIb, severe pulmonary hypertension, moderate AS/mild MR, AAA, NSTEMI, TIA, paroxysmal atrial fibrillation-not on anticoagulation, hypothyroidism, and COPD with chronic hypoxemia 2-3 L baseline nasal cannula, who presented to the ED with worsening respiratory distress that began last night and worsened this morning.  She is noted to have acute hypoxemic respiratory failure with bilateral pleural effusion in the setting of NSTEMI and remains on heparin drip.  Cardiology following and recommending a total 48-hour course of IV heparin.  Assessment & Plan:   Principal Problem:   Acute on chronic hypoxic respiratory failure (HCC) Active Problems:   Myelodysplastic syndrome (HCC)   Hypothyroidism   COPD with acute exacerbation (HCC)   Abdominal aortic aneurysm   HTN (hypertension)   Anemia of chronic disease   Acute on chronic diastolic CHF (congestive heart failure) (HCC)   Pancytopenia (HCC)   Mitral regurgitation   CAD (coronary artery disease)   History of non-ST elevation myocardial infarction (NSTEMI)   CMML (chronic myelomonocytic leukemia) (HCC)  Assessment and Plan:   Acute on chronic hypoxemic respiratory failure in the setting of NSTEMI with bilateral pleural effusion -Reds clip reading 23% today discontinue IV Lasix -Continue IV heparin drip for total 48 hours -Continue aspirin, Imdur, Toprol-XL, and Pravachol -No further need for IV Lasix -Back to baseline [place]  History of COPD -No significant exacerbation currently noted, discontinue steroids -DuoNebs as needed   Severe pulmonary hypertension -Appears to be group 2 with recommendation to continue cardiac and lung conditions   CKD stage IIIb -Continue to monitor creatinine carefully with diuresis  and appears to be at baseline   Paroxysmal atrial fibrillation -None noted to be a candidate for OAC -Continue metoprolol -Amiodarone 200 mg twice daily per cardiology to better suppress atrial fibrillation  Mild hyperkalemia -Give Lokelma today -Follow up labs in am  Hypomagnesemia -Replete and reevaluate in a.m.   Aortic stenosis/mitral regurgitation -Not a candidate for valvular surgery due to myelodysplastic syndrome   MDS/CMML with neutropenia and thrombocytopenia -Follow up with Dr. [name] outpatient   Hypothyroidism -Continue levothyroxine   Prior TIA and NSTEMI -Managed medically with statin -Continue metoprolol and Imdur -Not on antiplatelets or anticoagulation due to prior GI bleed, but started on aspirin 81 mg daily per cardiology.  This can be closely monitored outpatient.   4.8 cm AAA -Not noted to be a surgical candidate by vascular surgery    DVT prophylaxis:Heparin drip Code Status: Full Family Communication: Discussed with grandson POA on phone 9/10 Disposition Plan:  Status is: Inpatient Remains inpatient appropriate because: Need for IV medication.  Consultants:  Cardiology  Procedures:  None  Antimicrobials:  None   Subjective: Patient seen and evaluated today with improvements in shortness of no new acute complaints or concerns. No acute concerns or events noted overnight.  Objective: Vitals:   07/13/23 1000 07/13/23 1021 07/13/23 1023 07/13/23 1100  BP: 105/64 118/76 118/76   Pulse:   95   Resp:   20   Temp:    98.1 F (36.7 C)  TempSrc:    Oral  SpO2:   100%   Weight:      Height:        Intake/Output Summary (Last 24 hours) at 07/13/2023 1333 Last data filed at 07/13/2023 0404 Gross per  24 hour  Intake 640.14 ml  Output 1100 ml  Net -459.86 ml   Filed Weights   07/12/23 0801 07/12/23 1038 07/13/23 0403  Weight: 67 kg 66.7 kg 67.1 kg    Examination:  General exam: Appears calm and comfortable  Respiratory  system: Clear to auscultation. Respiratory effort normal.  Nasal cannula. Cardiovascular system: S1 & S2 heard, RRR.  Gastrointestinal system: Abdomen is soft Central nervous system: Alert and awake Extremities: No edema Skin: No significant lesions noted Psychiatry: Flat affect.    Data Reviewed: I have personally reviewed following labs and imaging studies  CBC: Recent Labs  Lab 07/12/23 0820 07/13/23 0426  WBC 3.8* 1.3*  NEUTROABS 1.3*  --   HGB 10.6* 9.6*  HCT 38.1 32.8*  MCV 110.1* 103.5*  PLT 80* 90*   Basic Metabolic Panel: Recent Labs  Lab 07/12/23 0820 07/13/23 0426  NA 138 133*  K 5.0 5.2*  CL 102 98  CO2 27 26  GLUCOSE 183* 131*  BUN 17 28*  CREATININE 1.09* 1.07*  CALCIUM 9.1 8.7*  MG 2.0 1.6*   GFR: Estimated Creatinine Clearance: 34.1 mL/min (A) (by C-G formula based on SCr of 1.07 mg/dL (H)). Liver Function Tests: Recent Labs  Lab 07/12/23 0820 07/13/23 0426  AST 25 95*  ALT 13 18  ALKPHOS 54 40  BILITOT 0.7 0.9  PROT 8.6* 8.3*  ALBUMIN 3.6 3.4*   No results for input(s): "LIPASE", "AMYLASE" in the last 168 hours. No results for input(s): "AMMONIA" in the last 168 hours. Coagulation Profile: Recent Labs  Lab 07/12/23 0820  INR 1.2   Cardiac Enzymes: No results for input(s): "CKTOTAL", "CKMB", "CKMBINDEX", "TROPONINI" in the last 168 hours. BNP (last 3 results) No results for input(s): "PROBNP" in the last 8760 hours. HbA1C: No results for input(s): "HGBA1C" in the last 72 hours. CBG: Recent Labs  Lab 07/12/23 1140 07/12/23 1612  GLUCAP 77 117*   Lipid Profile: Recent Labs    07/13/23 0426  CHOL 145  HDL 49  LDLCALC 86  TRIG 51  CHOLHDL 3.0   Thyroid Function Tests: No results for input(s): "TSH", "T4TOTAL", "FREET4", "T3FREE", "THYROIDAB" in the last 72 hours. Anemia Panel: No results for input(s): "VITAMINB12", "FOLATE", "FERRITIN", "TIBC", "IRON", "RETICCTPCT" in the last 72 hours. Sepsis Labs: No results for  input(s): "PROCALCITON", "LATICACIDVEN" in the last 168 hours.  Recent Results (from the past 240 hour(s))  Resp panel by RT-PCR (RSV, Flu A&B, Covid) Anterior Nasal Swab     Status: None   Collection Time: 07/12/23  8:40 AM   Specimen: Anterior Nasal Swab  Result Value Ref Range Status   SARS Coronavirus 2 by RT PCR NEGATIVE NEGATIVE Final    Comment: (NOTE) SARS-CoV-2 target nucleic acids are NOT DETECTED.  The SARS-CoV-2 RNA is generally detectable in upper respiratory specimens during the acute phase of infection. The lowest concentration of SARS-CoV-2 viral copies this assay can detect is 138 copies/mL. A negative result does not preclude SARS-Cov-2 infection and should not be used as the sole basis for treatment or other patient management decisions. A negative result may occur with  improper specimen collection/handling, submission of specimen other than nasopharyngeal swab, presence of viral mutation(s) within the areas targeted by this assay, and inadequate number of viral copies(<138 copies/mL). A negative result must be combined with clinical observations, patient history, and epidemiological information. The expected result is Negative.  Fact Sheet for Patients:  BloggerCourse.com  Fact Sheet for Healthcare Providers:  SeriousBroker.it  This test is no t yet approved or cleared by the [place] and  has been authorized for detection and/or diagnosis of SARS-CoV-2 by FDA under an Emergency Use Authorization (EUA). This EUA will remain  in effect (meaning this test can be used) for the duration of the COVID-19 declaration under Section 564(b)(1) of the Act, 21 U.S.C.section 360bbb-3(b)(1), unless the authorization is terminated  or revoked sooner.       Influenza A by PCR NEGATIVE NEGATIVE Final   Influenza B by PCR NEGATIVE NEGATIVE Final    Comment: (NOTE) The Xpert Xpress SARS-CoV-2/FLU/RSV plus assay is  intended as an aid in the diagnosis of influenza from Nasopharyngeal swab specimens and should not be used as a sole basis for treatment. Nasal washings and aspirates are unacceptable for Xpert Xpress SARS-CoV-2/FLU/RSV testing.  Fact Sheet for Patients: BloggerCourse.com  Fact Sheet for Healthcare Providers: SeriousBroker.it  This test is not yet approved or cleared by the [place] FDA and has been authorized for detection and/or diagnosis of SARS-CoV-2 by FDA under an Emergency Use Authorization (EUA). This EUA will remain in effect (meaning this test can be used) for the duration of the COVID-19 declaration under Section 564(b)(1) of the Act, 21 U.S.C. section 360bbb-3(b)(1), unless the authorization is terminated or revoked.     Resp Syncytial Virus by PCR NEGATIVE NEGATIVE Final    Comment: (NOTE) Fact Sheet for Patients: BloggerCourse.com  Fact Sheet for Healthcare Providers: SeriousBroker.it  This test is not yet approved or cleared by the [place] FDA and has been authorized for detection and/or diagnosis of SARS-CoV-2 by FDA under an Emergency Use Authorization (EUA). This EUA will remain in effect (meaning this test can be used) for the duration of the COVID-19 declaration under Section 564(b)(1) of the Act, 21 U.S.C. section 360bbb-3(b)(1), unless the authorization is terminated or revoked.  Performed at [facility], [address], [place], [place] [address]   MRSA Next Gen by PCR, Nasal     Status: None   Collection Time: 07/12/23 10:41 AM   Specimen: Nasal Mucosa; Nasal Swab  Result Value Ref Range Status   MRSA by PCR Next Gen NOT DETECTED NOT DETECTED Final    Comment: (NOTE) The GeneXpert MRSA Assay (FDA approved for NASAL specimens only), is one component of a comprehensive MRSA colonization surveillance program. It is not intended to diagnose  MRSA infection nor to guide or monitor treatment for MRSA infections. Test performance is not FDA approved in patients less than 56 years old. Performed at [facility], [address] [place], [place] [address]          Radiology Studies: ECHOCARDIOGRAM COMPLETE  Result Date: 07/12/2023    ECHOCARDIOGRAM REPORT   Patient Name:   Lisa [name] Date of Exam: 07/12/2023 Medical Rec #:  [id number]      Height:       63.0 in Accession #:    [id number]     Weight:       147.0 lb Date of Birth:  Dec 30, 1933      BSA:          1.697 m Patient Age:    89 years       BP:           132/41 mmHg Patient Gender: F              HR:           65 bpm. [facility]am [facility] Procedure: 2D  Echo, Cardiac Doppler, Color Doppler and Strain Analysis Indications:    Dyspnea  History:        Patient has prior history of Echocardiogram examinations, most                 recent 05/20/2023. CHF, CAD and Previous Myocardial Infarction,                 TIA and COPD, Arrythmias:Atrial Fibrillation,                 Signs/Symptoms:Dyspnea; Risk Factors:Hypertension and Former                 Smoker. Breast and Lung CA.  Sonographer:    Mikki Harbor Referring Phys: [id number] Dorothe Pea BRANCH  Sonographer Comments: Global longitudinal strain was attempted. IMPRESSIONS  1. Left ventricular ejection fraction, by estimation, is 60 to 65%. The left ventricle has normal function. The left ventricle has no regional wall motion abnormalities. There is moderate left ventricular hypertrophy. Left ventricular diastolic parameters are consistent with Grade II diastolic dysfunction (pseudonormalization). Elevated left atrial pressure.  2. Right ventricular systolic function is normal. The right ventricular size is mildly enlarged. Tricuspid regurgitation signal is inadequate for assessing PA pressure.  3. Left atrial size was severely dilated.  4. Right atrial size was mildly dilated.  5. The mitral valve is abnormal. Mild to moderate mitral  valve regurgitation. No evidence of mitral stenosis.  6. The tricuspid valve is abnormal.  7. The aortic valve is tricuspid. There is severe calcifcation of the aortic valve. There is severe thickening of the aortic valve. Aortic valve regurgitation is not visualized. Moderate to severe aortic valve stenosis. Aortic valve mean gradient measures 22.0 mmHg. Aortic valve peak gradient measures 41.9 mmHg. Aortic valve area, by VTI measures 0.81 cm.  8. The inferior vena cava is normal in size with <50% respiratory variability, suggesting right atrial pressure of 8 mmHg. FINDINGS  Left Ventricle: Left ventricular ejection fraction, by estimation, is 60 to 65%. The left ventricle has normal function. The left ventricle has no regional wall motion abnormalities. Global longitudinal strain performed but not reported based on interpreter judgement due to suboptimal tracking. The left ventricular internal cavity size was normal in size. There is moderate left ventricular hypertrophy. Left ventricular diastolic parameters are consistent with Grade II diastolic dysfunction (pseudonormalization). Elevated left atrial pressure. Right Ventricle: The right ventricular size is mildly enlarged. Right vetricular wall thickness was not well visualized. Right ventricular systolic function is normal. Tricuspid regurgitation signal is inadequate for assessing PA pressure. Left Atrium: Left atrial size was severely dilated. Right Atrium: Right atrial size was mildly dilated. Pericardium: There is no evidence of pericardial effusion. Mitral Valve: The mitral valve is abnormal. There is moderate thickening of the mitral valve leaflet(s). There is moderate calcification of the mitral valve leaflet(s). Mild to moderate mitral annular calcification. Mild to moderate mitral valve regurgitation. No evidence of mitral valve stenosis. MV peak gradient, 6.2 mmHg. The mean mitral valve gradient is 2.0 mmHg. Tricuspid Valve: The tricuspid valve is  abnormal. Tricuspid valve regurgitation is mild . No evidence of tricuspid stenosis. Aortic Valve: The aortic valve is tricuspid. There is severe calcifcation of the aortic valve. There is severe thickening of the aortic valve. There is severe aortic valve annular calcification. Aortic valve regurgitation is not visualized. Moderate to severe aortic stenosis is present. Aortic valve mean gradient measures 22.0 mmHg. Aortic valve peak gradient measures 41.9 mmHg. Aortic valve area, by  VTI measures 0.81 cm. Pulmonic Valve: The pulmonic valve was not well visualized. Pulmonic valve regurgitation is mild to moderate. No evidence of pulmonic stenosis. Aorta: The aortic root and ascending aorta are structurally normal, with no evidence of dilitation. Venous: The inferior vena cava is normal in size with less than 50% respiratory variability, suggesting right atrial pressure of 8 mmHg. IAS/Shunts: No atrial level shunt detected by color flow Doppler.  LEFT VENTRICLE PLAX 2D LVIDd:         4.40 cm   Diastology LVIDs:         2.70 cm   LV e' medial:    5.43 cm/s LV PW:         1.30 cm   LV E/e' medial:  20.6 LV IVS:        1.40 cm   LV e' lateral:   8.18 cm/s LVOT diam:     2.00 cm   LV E/e' lateral: 13.7 LV SV:         69 LV SV Index:   40 LVOT Area:     3.14 cm  RIGHT VENTRICLE RV Basal diam:  3.80 cm RV Mid diam:    3.40 cm RV S prime:     11.40 cm/s TAPSE (M-mode): 2.3 cm LEFT ATRIUM             Index        RIGHT ATRIUM           Index LA diam:        4.00 cm 2.36 cm/m   RA Area:     19.70 cm LA Vol (A2C):   85.3 ml 50.27 ml/m  RA Volume:   61.80 ml  36.42 ml/m LA Vol (A4C):   77.4 ml 45.62 ml/m LA Biplane Vol: 87.7 ml 51.69 ml/m  AORTIC VALVE                     PULMONIC VALVE AV Area (Vmax):    0.87 cm      PV Vmax:          0.94 m/s AV Area (Vmean):   0.77 cm      PV Peak grad:     3.5 mmHg AV Area (VTI):     0.81 cm      PR End Diast Vel: 2.60 msec AV Vmax:           323.50 cm/s AV Vmean:          219.000  cm/s AV VTI:            0.845 m AV Peak Grad:      41.9 mmHg AV Mean Grad:      22.0 mmHg LVOT Vmax:         90.05 cm/s LVOT Vmean:        53.750 cm/s LVOT VTI:          0.218 m LVOT/AV VTI ratio: 0.26  AORTA Ao Root diam: 3.40 cm Ao Asc diam:  3.20 cm MITRAL VALVE MV Area (PHT): 4.15 cm       SHUNTS MV Area VTI:   2.03 cm       Systemic VTI:  0.22 m MV Peak grad:  6.2 mmHg       Systemic Diam: 2.00 cm MV Mean grad:  2.0 mmHg MV Vmax:       1.24 m/s MV Vmean:      60.2 cm/s MV Decel Time: 183 msec MR  Peak grad:    137.4 mmHg MR Mean grad:    88.0 mmHg MR Vmax:         586.00 cm/s MR Vmean:        439.0 cm/s MR PISA:         1.57 cm MR PISA Eff ROA: 25 mm MR PISA Radius:  0.50 cm MV E velocity: 112.00 cm/s MV A velocity: 60.00 cm/s MV E/A ratio:  1.87 Dina Rich MD Electronically signed by Dina Rich MD Signature Date/Time: 07/12/2023/1:08:53 PM    Final    DG Chest Portable 1 View  Result Date: 07/12/2023 CLINICAL DATA:  89 year old female with difficulty breathing onset 0200 hours. Atrial fibrillation, lung cancer. EXAM: PORTABLE CHEST 1 VIEW COMPARISON:  Chest CTA 11/03/2022. Radiographs 06/09/2023 and earlier. FINDINGS: Portable AP upright view at 0838 hours. Emphysema demonstrated by CTA last year. Cardiomegaly. Calcified aortic atherosclerosis. Other mediastinal contours are within normal limits. Confluent and veiling bilateral lung base opacity appears only in part due to pleural effusions. There is some generalized increase in the pulmonary interstitium, vasculature appears indistinct. No pneumothorax. Visualized tracheal air column is within normal limits. No acute osseous abnormality identified. Chronic postoperative changes to the left axilla, chest wall. Paucity of bowel gas in the visible abdomen. IMPRESSION: 1. Emphysema with confluent, indeterminate bibasilar opacity which seems only in part due to layering pleural effusions. Increased interstitium/vascularity elsewhere. Consider acute  pulmonary edema and bilateral pleural effusions and atelectasis. Viral/atypical infection or lung base aspiration difficult to exclude. 2. Cardiomegaly, calcified aortic atherosclerosis. Electronically Signed   By: Odessa Fleming M.D.   On: 07/12/2023 09:05        Scheduled Meds:  amiodarone  200 mg Oral BID   aspirin EC  81 mg Oral Daily   budesonide (PULMICORT) nebulizer solution  0.25 mg Nebulization BID   Chlorhexidine Gluconate Cloth  6 each Topical Q0600   isosorbide mononitrate  60 mg Oral Daily   levothyroxine  100 mcg Oral QAC breakfast   magnesium oxide  200 mg Oral QHS   melatonin  3 mg Oral QHS   metoprolol succinate  25 mg Oral Daily   multivitamin with minerals  1 tablet Oral Daily   pantoprazole  40 mg Oral BID   pravastatin  40 mg Oral QPM   predniSONE  40 mg Oral Q breakfast   psyllium  1 packet Oral Daily   senna  1 tablet Oral QHS   Continuous Infusions:  heparin 750 Units/hr (07/13/23 1217)     LOS: 1 day    Time spent: 35 minutes    Poonam Woehrle Hoover Brunette, DO [facility]  If 7PM-7AM, please contact night-coverage [email or web address] 07/13/2023, 1:33 PM

## 2023-07-13 NOTE — Consult Note (Addendum)
Consultation Note Date: 07/13/2023   Patient Name: Lisa [name]  DOB: December 07, 1933  MRN: [id number]  Age / Sex: 89 y.o., female  PCP: [name], MD Referring Physician: Erick Blinks, DO  Reason for Consultation:  goals of care  HPI/Patient Profile: 89 y.o. female  with past medical history of chronic hearfailure with preserved EF, CKD IIIb, severe pulmonary hypertension, NSTEMI, paroxysmal a-fib, hypothyroid, COPD with chronic resp failure on 2-3L at home admitted on 07/12/2023 with shortness of breath. Workup reveals NSTEMI. She also had not been taking her lasix the correct way. Cardiology consulted- did not recommend invasive cardiac assessment- recommended medical treatment with IV heparin. Palliative medicine consulted for GOC.    Primary Decision Maker PATIENT - per report she has HCPOA - Lisa [name] if she is unable to make decisions on her own.   Discussion: Chart reviewed including labs, progress notes, imaging from this and previous encounters.   I have reviewed medical records including EPIC notes, labs and imaging, received report from RN, assessed the patient and then met at the bedside along with patient's daughter to discuss diagnosis prognosis, GOC, EOL wishes, disposition and options.  On eval patient awake, alert and oriented. She was able to participate in GOC discussion.   I introduced Palliative Medicine as specialized medical care for people living with serious illness. It focuses on providing relief from the symptoms and stress of a serious illness. The goal is to improve quality of life for both the patient and the family.  Ms. Grainger lives at home and is independent with ADL's. Her daughter, Lisa [name], lives with her.  She was in good spirits and smiled broadly when talking about her children and grandchildren as her primary source of joy.   We discussed a brief life review of  the patient and then focused on their current illness. The natural disease trajectory and expectations at EOL were discussed.  I attempted to elicit values and goals of care important to the patient.    Advanced directives, concepts specific to code status, and rehospitalization were considered and discussed. Patient has HCPOA document appointing her grandson- Lisa [name] if she is unable to make decisions. She will for full scope care and ventilation if necessary, but would not want to be maintained long term on life support. She has had this discussion with her family members.   Discussed the importance of continued conversation with family and the medical providers regarding overall plan of care and treatment options, ensuring decisions are within the context of the patient's values and GOCs.     SUMMARY OF RECOMMENDATIONS -Continue current interventions- full code, full scope care  Addendum- I was able to locate an HCPOA document on patient's chart, however, it is not signed or notarized so it isn't valid- will need to request an up to date document     Code Status/Advance Care Planning: Full code   Prognosis:   Unable to determine  Discharge Planning:  Home  Primary Diagnoses: Present on Admission:  Acute on  chronic hypoxic respiratory failure (HCC)  Abdominal aortic aneurysm  Acute on chronic diastolic CHF (congestive heart failure) (HCC)  Anemia of chronic disease  CAD (coronary artery disease)  CMML (chronic myelomonocytic leukemia) (HCC)  COPD with acute exacerbation (HCC)  HTN (hypertension)  Hypothyroidism  Mitral regurgitation  Myelodysplastic syndrome (HCC)  Pancytopenia (HCC)    Physical Exam Vitals and nursing note reviewed.  Constitutional:      General: She is not in acute distress. Neurological:     Mental Status: She is alert and oriented to person, place, and time.  Psychiatric:     Comments: pleasant     Vital Signs: BP (!) 143/83   Pulse  91   Temp 98.1 F (36.7 C) (Oral)   Resp 20   Ht 5\' 3"  (1.6 m)   Wt 67.1 kg   SpO2 97%   BMI 26.20 kg/m  Pain Scale: 0-10 POSS *See Group Information*: 1-Acceptable,Awake and alert Pain Score: 0-No pain   SpO2: SpO2: 97 % O2 Device:SpO2: 97 % O2 Flow Rate: .O2 Flow Rate (L/min): 2 L/min  IO: Intake/output summary:  Intake/Output Summary (Last 24 hours) at 07/13/2023 1535 Last data filed at 07/13/2023 0404 Gross per 24 hour  Intake 640.14 ml  Output 1100 ml  Net -459.86 ml    LBM: Last BM Date : 07/11/23 Baseline Weight: Weight: 67 kg Most recent weight: Weight: 67.1 kg       Thank you for this consult. Palliative medicine will continue to follow and assist as needed.   Greater than 50%  of this time was spent counseling and coordinating care related to the above assessment and plan.  Signed by: Ocie Bob, AGNP-C Palliative Medicine    Please contact Palliative Medicine Team phone at [phone] for questions and concerns.  For individual provider: See Loretha Stapler

## 2023-07-13 NOTE — Consult Note (Signed)
Pharmacy Consult Note - Anticoagulation  Pharmacy Consult for heparin Indication: chest pain/ACS  PATIENT MEASUREMENTS: Height: 5\' 3"  (160 cm) Weight: 67.1 kg (147 lb 14.9 oz) IBW/kg (Calculated) : 52.4 HEPARIN DW (KG): 65.9  VITAL SIGNS: Temp: 98.1 F (36.7 C) (09/10 0403) Temp Source: Oral (09/10 0403) BP: 168/74 (09/10 0500) Pulse Rate: 74 (09/10 0530)  Recent Labs    07/12/23 0820 07/12/23 1001 07/12/23 1259 07/12/23 2151 07/13/23 0426 07/13/23 0801  HGB 10.6*  --   --   --  9.6*  --   HCT 38.1  --   --   --  32.8*  --   PLT 80*  --   --   --  90*  --   APTT 33  --   --   --   --   --   LABPROT 15.7*  --   --   --   --   --   INR 1.2  --   --   --   --   --   HEPARINUNFRC  --   --   --    < >  --  0.41  CREATININE 1.09*  --   --   --  1.07*  --   TROPONINIHS 44*   < > >24,000*  --   --   --    < > = values in this interval not displayed.    Estimated Creatinine Clearance: 34.1 mL/min (A) (by C-G formula based on SCr of 1.07 mg/dL (H)).  PAST MEDICAL HISTORY: Past Medical History:  Diagnosis Date   AAA (abdominal aortic aneurysm) (HCC)    Adenocarcinoma of left lung (HCC) 2006   AF (paroxysmal atrial fibrillation) (HCC)    Aortic stenosis    Arthritis    Asthma    Cancer of breast, female (HCC)    Cancer of lung (HCC)    Dysrhythmia    Hypertension    Hypothyroidism    Invasive ductal carcinoma of left breast (HCC) 1999   Mitral regurgitation    Myocardial infarction (HCC)    Neutropenia (HCC) 06/09/2016   Personal history of radiation therapy    Pulmonary hypertension (HCC)     ASSESSMENT: 89 y.o. female with PMH HFpEF, CKD3, severe pulmonary HTN, VHD, AAA, NSTEMI, TIA, pAF (not on AC d/t history of anemia and thrombocytopenia) is presenting with ACS. Patient brought in by EMS satting 59% on 3 L with diaphoresis. Troponin >24,000. Pharmacy has been consulted to  manage heparin intravenous infusion.  -heparin level at goal on 750 units/hr Hgb  9.6  Goal(s) of therapy: Heparin level 0.3 - 0.7 units/mL Monitor platelets by anticoagulation protocol: Yes    PLAN: -Continue heparin at 750 units/hr -Daily heparin level and CBC  Judeth Cornfield, PharmD Clinical Pharmacist 07/13/2023 8:17 AM

## 2023-07-14 ENCOUNTER — Inpatient Hospital Stay (HOSPITAL_BASED_OUTPATIENT_CLINIC_OR_DEPARTMENT_OTHER): Payer: Medicare Other | Admitting: Pulmonary Disease

## 2023-07-14 DIAGNOSIS — I214 Non-ST elevation (NSTEMI) myocardial infarction: Secondary | ICD-10-CM

## 2023-07-14 DIAGNOSIS — I252 Old myocardial infarction: Secondary | ICD-10-CM

## 2023-07-14 DIAGNOSIS — D469 Myelodysplastic syndrome, unspecified: Secondary | ICD-10-CM | POA: Diagnosis not present

## 2023-07-14 DIAGNOSIS — J9621 Acute and chronic respiratory failure with hypoxia: Secondary | ICD-10-CM | POA: Diagnosis not present

## 2023-07-14 LAB — CBC
HCT: 27.5 % — ABNORMAL LOW (ref 36.0–46.0)
Hemoglobin: 8.4 g/dL — ABNORMAL LOW (ref 12.0–15.0)
MCH: 30.5 pg (ref 26.0–34.0)
MCHC: 30.5 g/dL (ref 30.0–36.0)
MCV: 100 fL (ref 80.0–100.0)
Platelets: 95 10*3/uL — ABNORMAL LOW (ref 150–400)
RBC: 2.75 MIL/uL — ABNORMAL LOW (ref 3.87–5.11)
RDW: 17.7 % — ABNORMAL HIGH (ref 11.5–15.5)
WBC: 2.9 10*3/uL — ABNORMAL LOW (ref 4.0–10.5)
nRBC: 3.1 % — ABNORMAL HIGH (ref 0.0–0.2)

## 2023-07-14 LAB — COMPREHENSIVE METABOLIC PANEL
ALT: 15 U/L (ref 0–44)
AST: 55 U/L — ABNORMAL HIGH (ref 15–41)
Albumin: 3.2 g/dL — ABNORMAL LOW (ref 3.5–5.0)
Alkaline Phosphatase: 36 U/L — ABNORMAL LOW (ref 38–126)
Anion gap: 7 (ref 5–15)
BUN: 27 mg/dL — ABNORMAL HIGH (ref 8–23)
CO2: 28 mmol/L (ref 22–32)
Calcium: 9 mg/dL (ref 8.9–10.3)
Chloride: 94 mmol/L — ABNORMAL LOW (ref 98–111)
Creatinine, Ser: 1.01 mg/dL — ABNORMAL HIGH (ref 0.44–1.00)
GFR, Estimated: 54 mL/min — ABNORMAL LOW (ref >60)
Glucose, Bld: 97 mg/dL (ref 70–99)
Potassium: 4.5 mmol/L (ref 3.5–5.1)
Sodium: 129 mmol/L — ABNORMAL LOW (ref 135–145)
Total Bilirubin: 0.7 mg/dL (ref 0.3–1.2)
Total Protein: 7.7 g/dL (ref 6.5–8.1)

## 2023-07-14 LAB — HEPARIN LEVEL (UNFRACTIONATED): Heparin Unfractionated: 0.27 [IU]/mL — ABNORMAL LOW (ref 0.30–0.70)

## 2023-07-14 LAB — MAGNESIUM: Magnesium: 2.1 mg/dL (ref 1.7–2.4)

## 2023-07-14 MED ORDER — ENOXAPARIN SODIUM 30 MG/0.3ML IJ SOSY
30.0000 mg | PREFILLED_SYRINGE | INTRAMUSCULAR | Status: DC
  Administered 2023-07-14: 30 mg via SUBCUTANEOUS
  Filled 2023-07-14: qty 0.3

## 2023-07-14 NOTE — Progress Notes (Signed)
   07/14/23 0724  ReDS Vest / Clip  Station Marker A  Ruler Value 32  ReDS Value Range < 36  ReDS Actual Value 21

## 2023-07-14 NOTE — Evaluation (Signed)
Physical Therapy Evaluation Patient Details Name: Lisa [name] MRN: [id number] DOB: Oct 05, 1934 Today's Date: 07/14/2023  History of Present Illness  Lisa [name] is a 89 y.o. female with medical history significant for chronic HFpEF, CKD stage IIIb, severe pulmonary hypertension, moderate AS/mild MR, AAA, NSTEMI, TIA, paroxysmal atrial fibrillation-not on anticoagulation, hypothyroidism, and COPD with chronic hypoxemia 2-3 L baseline nasal cannula, who presented to the ED with worsening respiratory distress that began last night and worsened this morning.  Apparently, she has not been taking her Lasix 20 mg daily as prescribed due to confusion on instructions for how to take this medication.  Her son called 911 and EMS noted oxygen saturation in the 50th percentile on her usual baseline 2-3 L nasal cannula.  She denied any chest pain, fevers, chills, abdominal pain, nausea, or vomiting.  She was admitted for similar issues earlier this year and discharged 8/9. Weight has been 143lbs recently   Clinical Impression  Patient functioning near baseline for functional mobility and gait demonstrating good return for getting into/out of bed, transferring to chair and ambulating in room/hallway without loss of balance.  Patient briefly sat up in chair, but requested to go back to bed due to fatigue and having sat up in chair earlier.  Patient will benefit from continued skilled physical therapy in hospital and recommended venue below to increase strength, balance, endurance for safe ADLs and gait.          If plan is discharge home, recommend the following: A little help with walking and/or transfers;A little help with bathing/dressing/bathroom;Help with stairs or ramp for entrance;Assistance with cooking/housework   Can travel by private vehicle        Equipment Recommendations None recommended by PT  Recommendations for Other Services       Functional Status Assessment Patient has had a recent  decline in their functional status and demonstrates the ability to make significant improvements in function in a reasonable and predictable amount of time.     Precautions / Restrictions Precautions Precautions: Fall Restrictions Weight Bearing Restrictions: No      Mobility  Bed Mobility Overal bed mobility: Modified Independent                  Transfers Overall transfer level: Needs assistance Equipment used: Rolling walker (2 wheels) Transfers: Sit to/from Stand, Bed to chair/wheelchair/BSC Sit to Stand: Supervision   Step pivot transfers: Contact guard assist       General transfer comment: slightly labored movement    Ambulation/Gait Ambulation/Gait assistance: Supervision, Contact guard assist Gait Distance (Feet): 60 Feet Assistive device: Rolling walker (2 wheels) Gait Pattern/deviations: Decreased step length - right, Decreased step length - left, Decreased stride length, Trunk flexed Gait velocity: decreased     General Gait Details: slightly labored cadence with good return for ambulating in room, hallway without loss of balance, on 2 LPM O2 with SpO2 at 93%, limited mostly due to fatigue  Stairs            Wheelchair Mobility     Tilt Bed    Modified Rankin (Stroke Patients Only)       Balance Overall balance assessment: Needs assistance Sitting-balance support: Feet supported, No upper extremity supported Sitting balance-Leahy Scale: Good Sitting balance - Comments: seated at EOB   Standing balance support: During functional activity, No upper extremity supported Standing balance-Leahy Scale: Poor Standing balance comment: fair/poor without AD, fair/good using RW  Pertinent Vitals/Pain Pain Assessment Pain Assessment: No/denies pain    Home Living Family/patient expects to be discharged to:: Private residence Living Arrangements: Children Available Help at Discharge: Family;Available  24 hours/day Type of Home: House Home Access: Stairs to enter Entrance Stairs-Rails: Right Entrance Stairs-Number of Steps: Front stairs: 4-5 steps with R HR; Back stairs: 4-5 steps with R HR Alternate Level Stairs-Number of Steps: 14- HR on R when descending, L when ascending Home Layout: Two level;Laundry or work area in basement;Able to live on main level with bedroom/bathroom Home Equipment: Agricultural consultant (2 wheels);Cane - quad;BSC/3in1;Grab bars - tub/shower;Wheelchair - manual;Other (comment)      Prior Function Prior Level of Function : Needs assist       Physical Assist : Mobility (physical);ADLs (physical) Mobility (physical): Transfers;Gait;Bed mobility;Stairs   Mobility Comments: household and short distanced community ambulator using quad-cane or RW, on constant 2-3 LPM O2 at home ADLs Comments: Assited for bathing and dressing. Able to toilet, groom, and feed on her own. IADL assist.     Extremity/Trunk Assessment   Upper Extremity Assessment Upper Extremity Assessment: Overall WFL for tasks assessed    Lower Extremity Assessment Lower Extremity Assessment: Generalized weakness    Cervical / Trunk Assessment Cervical / Trunk Assessment: Normal  Communication   Communication Communication: Hearing impairment;No apparent difficulties Cueing Techniques: Verbal cues  Cognition Arousal: Alert Behavior During Therapy: WFL for tasks assessed/performed Overall Cognitive Status: Within Functional Limits for tasks assessed                                          General Comments      Exercises     Assessment/Plan    PT Assessment Patient needs continued PT services  PT Problem List Decreased strength;Decreased activity tolerance;Decreased balance;Decreased mobility       PT Treatment Interventions DME instruction;Gait training;Stair training;Functional mobility training;Therapeutic activities;Therapeutic exercise;Balance  training;Patient/family education    PT Goals (Current goals can be found in the Care Plan section)  Acute Rehab PT Goals Patient Stated Goal: return home with family to assist PT Goal Formulation: With patient Time For Goal Achievement: 07/21/23 Potential to Achieve Goals: Good    Frequency Min 3X/week     Co-evaluation               AM-PAC PT "6 Clicks" Mobility  Outcome Measure Help needed turning from your back to your side while in a flat bed without using bedrails?: None Help needed moving from lying on your back to sitting on the side of a flat bed without using bedrails?: None Help needed moving to and from a bed to a chair (including a wheelchair)?: A Little Help needed standing up from a chair using your arms (e.g., wheelchair or bedside chair)?: None Help needed to walk in hospital room?: A Little Help needed climbing 3-5 steps with a railing? : A Little 6 Click Score: 21    End of Session Equipment Utilized During Treatment: Oxygen Activity Tolerance: Patient tolerated treatment well;Patient limited by fatigue Patient left: in bed;with call bell/phone within reach Nurse Communication: Mobility status PT Visit Diagnosis: Unsteadiness on feet (R26.81);Other abnormalities of gait and mobility (R26.89);Muscle weakness (generalized) (M62.81)    Time: 8295-6213 PT Time Calculation (min) (ACUTE ONLY): 28 min   Charges:   PT Evaluation $PT Eval Moderate Complexity: 1 Mod PT Treatments $Therapeutic Activity: 23-37 mins PT General Charges $$  ACUTE PT VISIT: 1 Visit         3:40 PM, 07/14/23 Ocie Bob, MPT Physical Therapist with [facility] 336 [phone] office [phone] mobile phone

## 2023-07-14 NOTE — Consult Note (Signed)
Pharmacy Consult Note - Anticoagulation  Pharmacy Consult for heparin Indication: chest pain/ACS  PATIENT MEASUREMENTS: Height: 5\' 3"  (160 cm) Weight: 70.1 kg (154 lb 9.6 oz) IBW/kg (Calculated) : 52.4 HEPARIN DW (KG): 65.9  VITAL SIGNS: Temp: 97.8 F (36.6 C) (09/11 0713) Temp Source: Oral (09/11 0713) BP: 163/77 (09/11 0600) Pulse Rate: 57 (09/11 0600)  Recent Labs    07/12/23 0820 07/12/23 1001 07/12/23 1259 07/12/23 2151 07/14/23 0440  HGB 10.6*  --   --    < > 8.4*  HCT 38.1  --   --    < > 27.5*  PLT 80*  --   --    < > 95*  APTT 33  --   --   --   --   LABPROT 15.7*  --   --   --   --   INR 1.2  --   --   --   --   HEPARINUNFRC  --   --   --    < > 0.27*  CREATININE 1.09*  --   --    < > 1.01*  TROPONINIHS 44*   < > >24,000*  --   --    < > = values in this interval not displayed.    Estimated Creatinine Clearance: 36.9 mL/min (A) (by C-G formula based on SCr of 1.01 mg/dL (H)).  PAST MEDICAL HISTORY: Past Medical History:  Diagnosis Date   AAA (abdominal aortic aneurysm) (HCC)    Adenocarcinoma of left lung (HCC) 2006   AF (paroxysmal atrial fibrillation) (HCC)    Aortic stenosis    Arthritis    Asthma    Cancer of breast, female (HCC)    Cancer of lung (HCC)    Dysrhythmia    Hypertension    Hypothyroidism    Invasive ductal carcinoma of left breast (HCC) 1999   Mitral regurgitation    Myocardial infarction (HCC)    Neutropenia (HCC) 06/09/2016   Personal history of radiation therapy    Pulmonary hypertension (HCC)     ASSESSMENT: 89 y.o. female with PMH HFpEF, CKD3, severe pulmonary HTN, VHD, AAA, NSTEMI, TIA, pAF (not on AC d/t history of anemia and thrombocytopenia) is presenting with ACS. Patient brought in by EMS satting 59% on 3 L with diaphoresis. Troponin >24,000. Pharmacy has been consulted to  manage heparin intravenous infusion.  HL 0.27- slightly subtherapeutic  Hgb 9.6> 8.4  Goal(s) of therapy: Heparin level 0.3 - 0.7  units/mL Monitor platelets by anticoagulation protocol: Yes    PLAN: Increase heparin infusion to 850 units/hr -Daily heparin level and CBC  Judeth Cornfield, PharmD Clinical Pharmacist 07/14/2023 8:14 AM

## 2023-07-14 NOTE — Plan of Care (Signed)

## 2023-07-14 NOTE — Plan of Care (Signed)
  Problem: Acute Rehab PT Goals(only PT should resolve) Goal: Pt Will Go Supine/Side To Sit Outcome: Progressing Flowsheets (Taken 07/14/2023 1541) Pt will go Supine/Side to Sit:  Independently  with modified independence Goal: Patient Will Transfer Sit To/From Stand Outcome: Progressing Flowsheets (Taken 07/14/2023 1541) Patient will transfer sit to/from stand:  with modified independence  with supervision Goal: Pt Will Transfer Bed To Chair/Chair To Bed Outcome: Progressing Flowsheets (Taken 07/14/2023 1541) Pt will Transfer Bed to Chair/Chair to Bed:  with modified independence  with supervision Goal: Pt Will Ambulate Outcome: Progressing Flowsheets (Taken 07/14/2023 1541) Pt will Ambulate:  100 feet  with modified independence  with supervision  with rolling walker   3:41 PM, 07/14/23 Lisa [name], MPT Physical Therapist with [facility] 336 [phone] office [phone] mobile phone

## 2023-07-14 NOTE — Plan of Care (Signed)

## 2023-07-14 NOTE — Hospital Course (Signed)
89 y.o. female with medical history significant for chronic HFpEF, CKD stage IIIb, severe pulmonary hypertension, moderate AS/mild MR, AAA, NSTEMI, TIA, paroxysmal atrial fibrillation-not on anticoagulation, hypothyroidism, and COPD with chronic hypoxemia 2-3 L baseline nasal cannula, who presented to the ED with worsening respiratory distress that began last night and worsened this morning.  She is noted to have acute hypoxemic respiratory failure with bilateral pleural effusion in the setting of NSTEMI and remains on heparin drip.  Cardiology following and recommending a total 48-hour course of IV heparin.

## 2023-07-14 NOTE — Progress Notes (Signed)
PROGRESS NOTE   Lisa [name]  ZOX:[id number] DOB: 1934/06/05 DOA: 07/12/2023 PCP: [name], MD   Chief Complaint  Patient presents with   Respiratory Distress   Level of care: Telemetry  Brief Admission History:  89 y.o. female with medical history significant for chronic HFpEF, CKD stage IIIb, severe pulmonary hypertension, moderate AS/mild MR, AAA, NSTEMI, TIA, paroxysmal atrial fibrillation-not on anticoagulation, hypothyroidism, and COPD with chronic hypoxemia 2-3 L baseline nasal cannula, who presented to the ED with worsening respiratory distress that began last night and worsened this morning.  She is noted to have acute hypoxemic respiratory failure with bilateral pleural effusion in the setting of NSTEMI and remains on heparin drip.  Cardiology following and recommending a total 48-hour course of IV heparin.    Assessment and Plan:  Acute on chronic hypoxemic respiratory failure in the setting of NSTEMI with bilateral pleural effusion -Reds clip reading 23% which is reassuring  -completed IV furosemide treatment  -Continue IV heparin drip for total 48 hours then stop   -Continue aspirin, Imdur, Toprol-XL, and Pravachol -Back to baseline supplemental oxygen requirements    History of COPD -No significant exacerbation currently noted, discontinue steroids -DuoNebs as needed   Severe pulmonary hypertension -Appears to be group 2 with recommendation to continue cardiac and lung conditions   CKD stage IIIb -Continue to monitor creatinine carefully with diuresis and appears to be at baseline   Paroxysmal atrial fibrillation -not a candidate for OAC due to history of serious GI bleed -Continue metoprolol for HR control  -Amiodarone loading 200 mg twice daily per cardiology to better suppress atrial fibrillation   Mild hyperkalemia -treated and resolved now   Hypomagnesemia -Repleted   Aortic stenosis/mitral regurgitation -Not a candidate for valvular surgery due to  myelodysplastic syndrome   MDS/CMML with neutropenia and thrombocytopenia -Follow up with Dr. [name] outpatient   Hypothyroidism -Continue levothyroxine   Prior TIA and NSTEMI -Managed medically with statin -Continue metoprolol and Imdur -Not on antiplatelets or anticoagulation due to prior GI bleed, but started on aspirin 81 mg daily per cardiology.  This can be closely monitored outpatient.   4.8 cm AAA -Noted not to be a surgical candidate by vascular surgery   DVT prophylaxis: enoxaparin  Code Status: Full Disposition: TBD   Consultants:  Cardiology  Procedures:   Antimicrobials:    Subjective: Pt remains on IV heparin infusion, no CP, no SOB; no headache per patient.   Objective: Vitals:   07/14/23 1200 07/14/23 1230 07/14/23 1435 07/14/23 1546  BP: 126/82   139/83  Pulse:   81 62  Resp: 19  20   Temp:  (!) 97.5 F (36.4 C)  98.6 F (37 C)  TempSrc:  Oral    SpO2: 97%  97% 97%  Weight:      Height:        Intake/Output Summary (Last 24 hours) at 07/14/2023 1647 Last data filed at 07/14/2023 1321 Gross per 24 hour  Intake 1093.28 ml  Output 1750 ml  Net -656.72 ml   Filed Weights   07/12/23 1038 07/13/23 0403 07/14/23 0500  Weight: 66.7 kg 67.1 kg 70.1 kg   Examination:  General exam: frail, elderly female, awake, alert, cooperative. NAD.  Respiratory system: no increased work of breathing. No rales heard.   Cardiovascular system: normal S1 & S2 heard. No pedal edema. Gastrointestinal system: Abdomen is nondistended, soft and nontender. No organomegaly or masses felt. Normal bowel sounds heard. Central nervous system: Alert and oriented to  person, place. No focal neurological deficits. Extremities: Symmetric 5 x 5 power. Skin: No rashes, lesions or ulcers. Psychiatry:  Mood & affect appropriate.   Data Reviewed: I have personally reviewed following labs and imaging studies  CBC: Recent Labs  Lab 07/12/23 0820 07/13/23 0426 07/14/23 0440   WBC 3.8* 1.3* 2.9*  NEUTROABS 1.3*  --   --   HGB 10.6* 9.6* 8.4*  HCT 38.1 32.8* 27.5*  MCV 110.1* 103.5* 100.0  PLT 80* 90* 95*    Basic Metabolic Panel: Recent Labs  Lab 07/12/23 0820 07/13/23 0426 07/14/23 0440  NA 138 133* 129*  K 5.0 5.2* 4.5  CL 102 98 94*  CO2 27 26 28   GLUCOSE 183* 131* 97  BUN 17 28* 27*  CREATININE 1.09* 1.07* 1.01*  CALCIUM 9.1 8.7* 9.0  MG 2.0 1.6* 2.1    CBG: Recent Labs  Lab 07/12/23 1140 07/12/23 1612  GLUCAP 77 117*    Recent Results (from the past 240 hour(s))  Resp panel by RT-PCR (RSV, Flu A&B, Covid) Anterior Nasal Swab     Status: None   Collection Time: 07/12/23  8:40 AM   Specimen: Anterior Nasal Swab  Result Value Ref Range Status   SARS Coronavirus 2 by RT PCR NEGATIVE NEGATIVE Final    Comment: (NOTE) SARS-CoV-2 target nucleic acids are NOT DETECTED.  The SARS-CoV-2 RNA is generally detectable in upper respiratory specimens during the acute phase of infection. The lowest concentration of SARS-CoV-2 viral copies this assay can detect is 138 copies/mL. A negative result does not preclude SARS-Cov-2 infection and should not be used as the sole basis for treatment or other patient management decisions. A negative result may occur with  improper specimen collection/handling, submission of specimen other than nasopharyngeal swab, presence of viral mutation(s) within the areas targeted by this assay, and inadequate number of viral copies(<138 copies/mL). A negative result must be combined with clinical observations, patient history, and epidemiological information. The expected result is Negative.  Fact Sheet for Patients:  BloggerCourse.com  Fact Sheet for Healthcare Providers:  SeriousBroker.it  This test is no t yet approved or cleared by the [place] FDA and  has been authorized for detection and/or diagnosis of SARS-CoV-2 by FDA under an Emergency Use  Authorization (EUA). This EUA will remain  in effect (meaning this test can be used) for the duration of the COVID-19 declaration under Section 564(b)(1) of the Act, 21 U.S.C.section 360bbb-3(b)(1), unless the authorization is terminated  or revoked sooner.       Influenza A by PCR NEGATIVE NEGATIVE Final   Influenza B by PCR NEGATIVE NEGATIVE Final    Comment: (NOTE) The Xpert Xpress SARS-CoV-2/FLU/RSV plus assay is intended as an aid in the diagnosis of influenza from Nasopharyngeal swab specimens and should not be used as a sole basis for treatment. Nasal washings and aspirates are unacceptable for Xpert Xpress SARS-CoV-2/FLU/RSV testing.  Fact Sheet for Patients: BloggerCourse.com  Fact Sheet for Healthcare Providers: SeriousBroker.it  This test is not yet approved or cleared by the [place] FDA and has been authorized for detection and/or diagnosis of SARS-CoV-2 by FDA under an Emergency Use Authorization (EUA). This EUA will remain in effect (meaning this test can be used) for the duration of the COVID-19 declaration under Section 564(b)(1) of the Act, 21 U.S.C. section 360bbb-3(b)(1), unless the authorization is terminated or revoked.     Resp Syncytial Virus by PCR NEGATIVE NEGATIVE Final    Comment: (NOTE) Fact Sheet for Patients:  BloggerCourse.com  Fact Sheet for Healthcare Providers: SeriousBroker.it  This test is not yet approved or cleared by the [place] FDA and has been authorized for detection and/or diagnosis of SARS-CoV-2 by FDA under an Emergency Use Authorization (EUA). This EUA will remain in effect (meaning this test can be used) for the duration of the COVID-19 declaration under Section 564(b)(1) of the Act, 21 U.S.C. section 360bbb-3(b)(1), unless the authorization is terminated or revoked.  Performed at [facility], [address],  [place], [place] [address]   MRSA Next Gen by PCR, Nasal     Status: None   Collection Time: 07/12/23 10:41 AM   Specimen: Nasal Mucosa; Nasal Swab  Result Value Ref Range Status   MRSA by PCR Next Gen NOT DETECTED NOT DETECTED Final    Comment: (NOTE) The GeneXpert MRSA Assay (FDA approved for NASAL specimens only), is one component of a comprehensive MRSA colonization surveillance program. It is not intended to diagnose MRSA infection nor to guide or monitor treatment for MRSA infections. Test performance is not FDA approved in patients less than 73 years old. Performed at [facility], [address] [place], [place] [address]      Radiology Studies: No results found.  Scheduled Meds:  amiodarone  200 mg Oral BID   aspirin EC  81 mg Oral Daily   Chlorhexidine Gluconate Cloth  6 each Topical Q0600   isosorbide mononitrate  60 mg Oral Daily   levothyroxine  100 mcg Oral QAC breakfast   magnesium oxide  200 mg Oral QHS   melatonin  3 mg Oral QHS   metoprolol succinate  25 mg Oral Daily   multivitamin with minerals  1 tablet Oral Daily   pantoprazole  40 mg Oral BID   pravastatin  40 mg Oral QPM   psyllium  1 packet Oral Daily   senna  1 tablet Oral QHS   Continuous Infusions:   LOS: 2 days   Critical Care Procedure Note Authorized and Performed by: Maryln Manuel MD  Total Critical Care time:  58 mins Due to a high probability of clinically significant, life threatening deterioration, the patient required my highest level of preparedness to intervene emergently and I personally spent this critical care time directly and personally managing the patient.  This critical care time included obtaining a history; examining the patient, pulse oximetry; ordering and review of studies; arranging urgent treatment with development of a management plan; evaluation of patient's response of treatment; frequent reassessment; and discussions with other providers.  This critical care time was performed  to assess and manage the high probability of imminent and life threatening deterioration that could result in multi-organ failure.  It was exclusive of separately billable procedures and treating other patients and teaching time.    Standley Dakins, MD How to contact the [facility] Attending or Consulting provider 7A - 7P or covering provider during after hours 7P -7A, for this patient?  Check the care team in [facility] and look for a) attending/consulting TR[facility] provider listed and b) the [facility] team listed Log into [email or web address] and use [place]'s universal password to access. If you do not have the password, please contact the hospital operator. Locate the [facility] provider you are looking for under [facility] and page to a number that you can be directly reached. If you still have difficulty reaching the provider, please page the [facility] (Director on Call) for the Hospitalists listed on amion for assistance.  07/14/2023, 4:47 PM

## 2023-07-14 NOTE — Progress Notes (Signed)
Progress Note  Patient Name: Lisa [name] Date of Encounter: 07/14/2023  Primary Cardiologist: Marjo Bicker, MD  Interval Summary   No shortness of breath, chest pain, or palpitations.  Has not been up to ambulate as yet.  Vital Signs    Vitals:   07/14/23 0500 07/14/23 0553 07/14/23 0600 07/14/23 0713  BP:  (!) 154/71 (!) 163/77   Pulse: 74 69 (!) 57   Resp: (!) 31 (!) 34 (!) 29   Temp:    97.8 F (36.6 C)  TempSrc:    Oral  SpO2: 97% 92% 92%   Weight: 70.1 kg     Height:        Intake/Output Summary (Last 24 hours) at 07/14/2023 0836 Last data filed at 07/14/2023 0600 Gross per 24 hour  Intake 794.78 ml  Output 600 ml  Net 194.78 ml   Filed Weights   07/12/23 1038 07/13/23 0403 07/14/23 0500  Weight: 66.7 kg 67.1 kg 70.1 kg    Physical Exam   GEN: No acute distress.   Neck: No JVD. Cardiac: RRR, 3/6 systolic murmur, no gallop.  Respiratory: Nonlabored.  Decreased basilar breath sounds. GI: Soft, nontender, bowel sounds present. MS: No edema. Neuro:  Nonfocal. Psych: Alert and oriented x 3. Normal affect.  ECG/Telemetry    An ECG dated 07/12/2023 was personally reviewed today and demonstrated:  Sinus rhythm with LVH and repolarization abnormalities with associated ischemic ST changes.  Telemetry shows sinus rhythm.  Labs    Chemistry Recent Labs  Lab 07/12/23 0820 07/13/23 0426 07/14/23 0440  NA 138 133* 129*  K 5.0 5.2* 4.5  CL 102 98 94*  CO2 27 26 28   GLUCOSE 183* 131* 97  BUN 17 28* 27*  CREATININE 1.09* 1.07* 1.01*  CALCIUM 9.1 8.7* 9.0  PROT 8.6* 8.3* 7.7  ALBUMIN 3.6 3.4* 3.2*  AST 25 95* 55*  ALT 13 18 15   ALKPHOS 54 40 36*  BILITOT 0.7 0.9 0.7  GFRNONAA 49* 50* 54*  ANIONGAP 9 9 7     Hematology Recent Labs  Lab 07/12/23 0820 07/13/23 0426 07/14/23 0440  WBC 3.8* 1.3* 2.9*  RBC 3.46* 3.17* 2.75*  HGB 10.6* 9.6* 8.4*  HCT 38.1 32.8* 27.5*  MCV 110.1* 103.5* 100.0  MCH 30.6 30.3 30.5  MCHC 27.8* 29.3* 30.5   RDW 18.2* 17.4* 17.7*  PLT 80* 90* 95*   Cardiac Enzymes Recent Labs  Lab 07/12/23 0820 07/12/23 1001 07/12/23 1259  TROPONINIHS 44* 2,997* >24,000*   Lipid Panel     Component Value Date/Time   CHOL 145 07/13/2023 0426   TRIG 51 07/13/2023 0426   HDL 49 07/13/2023 0426   CHOLHDL 3.0 07/13/2023 0426   VLDL 10 07/13/2023 0426   LDLCALC 86 07/13/2023 0426    Cardiac Studies   Echocardiogram 07/12/2023:  1. Left ventricular ejection fraction, by estimation, is 60 to 65%. The  left ventricle has normal function. The left ventricle has no regional  wall motion abnormalities. There is moderate left ventricular hypertrophy.  Left ventricular diastolic  parameters are consistent with Grade II diastolic dysfunction  (pseudonormalization). Elevated left atrial pressure.   2. Right ventricular systolic function is normal. The right ventricular  size is mildly enlarged. Tricuspid regurgitation signal is inadequate for  assessing PA pressure.   3. Left atrial size was severely dilated.   4. Right atrial size was mildly dilated.   5. The mitral valve is abnormal. Mild to moderate mitral valve  regurgitation. No evidence  of mitral stenosis.   6. The tricuspid valve is abnormal.   7. The aortic valve is tricuspid. There is severe calcifcation of the  aortic valve. There is severe thickening of the aortic valve. Aortic valve  regurgitation is not visualized. Moderate to severe aortic valve stenosis.  Aortic valve mean gradient  measures 22.0 mmHg. Aortic valve peak gradient measures 41.9 mmHg. Aortic  valve area, by VTI measures 0.81 cm.   8. The inferior vena cava is normal in size with <50% respiratory  variability, suggesting right atrial pressure of 8 mmHg.   Assessment & Plan   1.  NSTEMI, peak high-sensitivity troponin I greater than 24,000.  No active chest pain or shortness of breath.  Follow-up echocardiogram demonstrates normal LVEF at 60 to 65% without regional wall  motion abnormalities.  Plan is medical therapy as she is not an optimal candidate for invasive cardiac assessment and PCI given high bleeding risk.  2.  Paroxysmal atrial fibrillation with CHA2DS2-VASc score of 6.  Patient has not been anticoagulated with concerns for high bleeding risk in the setting of known MDS and documented GI bleeding.  Also not optimal candidate for Watchman implantation.  Amiodarone initiated for rhythm suppression.  3.  Degenerative calcific aortic stenosis, moderate to severe with mean AV gradient 22 mmHg and dimensionless index 0.26.  4.  HFpEF, LVEF 60 to 65% with moderate LVH and grade 2 diastolic dysfunction.  Initially diuresed with IV Lasix.  ReDS clip measurement 21 and diuretics held.  5.  Known pulmonary hypertension, likely WHO group 2 and 3.  Interval chart reviewed.  Patient will complete 48 hours of IV heparin around midday today and will then be discontinued.  Continues on low-dose aspirin with downward trend in hemoglobin to 8.4, but no overt bleeding.  Plan to continue low-dose amiodarone load 200 mg twice daily for now, Imdur, Toprol-XL, and Pravachol.  ARB intolerance per chart review, if blood pressure trend remains in current range consider addition of hydralazine.  Need to assess mobility, consider transfer to telemetry after heparin completed.  For questions or updates, please contact [facility] Please consult [email or web address] for contact info under   Signed, Nona Dell, MD  07/14/2023, 8:36 AM

## 2023-07-15 DIAGNOSIS — J9621 Acute and chronic respiratory failure with hypoxia: Secondary | ICD-10-CM | POA: Diagnosis not present

## 2023-07-15 DIAGNOSIS — J441 Chronic obstructive pulmonary disease with (acute) exacerbation: Secondary | ICD-10-CM | POA: Diagnosis not present

## 2023-07-15 DIAGNOSIS — E039 Hypothyroidism, unspecified: Secondary | ICD-10-CM

## 2023-07-15 DIAGNOSIS — I214 Non-ST elevation (NSTEMI) myocardial infarction: Secondary | ICD-10-CM | POA: Diagnosis not present

## 2023-07-15 LAB — BASIC METABOLIC PANEL
Anion gap: 7 (ref 5–15)
BUN: 26 mg/dL — ABNORMAL HIGH (ref 8–23)
CO2: 29 mmol/L (ref 22–32)
Calcium: 9.2 mg/dL (ref 8.9–10.3)
Chloride: 96 mmol/L — ABNORMAL LOW (ref 98–111)
Creatinine, Ser: 1.03 mg/dL — ABNORMAL HIGH (ref 0.44–1.00)
GFR, Estimated: 53 mL/min — ABNORMAL LOW (ref >60)
Glucose, Bld: 76 mg/dL (ref 70–99)
Potassium: 4.3 mmol/L (ref 3.5–5.1)
Sodium: 132 mmol/L — ABNORMAL LOW (ref 135–145)

## 2023-07-15 LAB — CBC
HCT: 29.6 % — ABNORMAL LOW (ref 36.0–46.0)
Hemoglobin: 8.7 g/dL — ABNORMAL LOW (ref 12.0–15.0)
MCH: 30.4 pg (ref 26.0–34.0)
MCHC: 29.4 g/dL — ABNORMAL LOW (ref 30.0–36.0)
MCV: 103.5 fL — ABNORMAL HIGH (ref 80.0–100.0)
Platelets: 99 10*3/uL — ABNORMAL LOW (ref 150–400)
RBC: 2.86 MIL/uL — ABNORMAL LOW (ref 3.87–5.11)
RDW: 18.1 % — ABNORMAL HIGH (ref 11.5–15.5)
WBC: 3.7 10*3/uL — ABNORMAL LOW (ref 4.0–10.5)
nRBC: 1.1 % — ABNORMAL HIGH (ref 0.0–0.2)

## 2023-07-15 MED ORDER — ACETAMINOPHEN 325 MG PO TABS: 500.0000 mg | ORAL_TABLET | Freq: Four times a day (QID) | ORAL | Status: DC | PRN

## 2023-07-15 MED ORDER — AMIODARONE HCL 200 MG PO TABS: ORAL_TABLET | ORAL | 1 refills | Status: DC

## 2023-07-15 MED ORDER — PRAVASTATIN SODIUM 40 MG PO TABS: 40.0000 mg | ORAL_TABLET | Freq: Every evening | ORAL | 2 refills | Status: DC

## 2023-07-15 MED ORDER — ASPIRIN 81 MG PO TBEC: 81.0000 mg | DELAYED_RELEASE_TABLET | Freq: Every day | ORAL | 2 refills | Status: DC

## 2023-07-15 MED ORDER — ISOSORBIDE MONONITRATE ER 60 MG PO TB24: 60.0000 mg | ORAL_TABLET | Freq: Every day | ORAL | 2 refills | Status: DC

## 2023-07-15 NOTE — Discharge Instructions (Signed)
IMPORTANT INFORMATION: PAY CLOSE ATTENTION   PHYSICIAN DISCHARGE INSTRUCTIONS  Follow with Primary care provider  Hall, John Z, MD  and other consultants as instructed by your Hospitalist Physician  SEEK MEDICAL CARE OR RETURN TO EMERGENCY ROOM IF SYMPTOMS COME BACK, WORSEN OR NEW PROBLEM DEVELOPS   Please note: You were cared for by a hospitalist during your hospital stay. Every effort will be made to forward records to your primary care provider.  You can request that your primary care provider send for your hospital records if they have not received them.  Once you are discharged, your primary care physician will handle any further medical issues. Please note that NO REFILLS for any discharge medications will be authorized once you are discharged, as it is imperative that you return to your primary care physician (or establish a relationship with a primary care physician if you do not have one) for your post hospital discharge needs so that they can reassess your need for medications and monitor your lab values.  Please get a complete blood count and chemistry panel checked by your Primary MD at your next visit, and again as instructed by your Primary MD.  Get Medicines reviewed and adjusted: Please take all your medications with you for your next visit with your Primary MD  Laboratory/radiological data: Please request your Primary MD to go over all hospital tests and procedure/radiological results at the follow up, please ask your primary care provider to get all Hospital records sent to his/her office.  In some cases, they will be blood work, cultures and biopsy results pending at the time of your discharge. Please request that your primary care provider follow up on these results.  If you are diabetic, please bring your blood sugar readings with you to your follow up appointment with primary care.    Please call and make your follow up appointments as soon as possible.    Also Note the  following: If you experience worsening of your admission symptoms, develop shortness of breath, life threatening emergency, suicidal or homicidal thoughts you must seek medical attention immediately by calling 911 or calling your MD immediately  if symptoms less severe.  You must read complete instructions/literature along with all the possible adverse reactions/side effects for all the Medicines you take and that have been prescribed to you. Take any new Medicines after you have completely understood and accpet all the possible adverse reactions/side effects.   Do not drive when taking Pain medications or sleeping medications (Benzodiazepines)  Do not take more than prescribed Pain, Sleep and Anxiety Medications. It is not advisable to combine anxiety,sleep and pain medications without talking with your primary care practitioner  Special Instructions: If you have smoked or chewed Tobacco  in the last 2 yrs please stop smoking, stop any regular Alcohol  and or any Recreational drug use.  Wear Seat belts while driving.  Do not drive if taking any narcotic, mind altering or controlled substances or recreational drugs or alcohol.       

## 2023-07-15 NOTE — TOC Transition Note (Signed)
Transition of Care [facility]) - CM/SW Discharge Note   Patient Details  Name: Lisa [name] MRN: [id number] Date of Birth: 11/10/1933  [facility] of [facility]) CM/SW Contact:  Elliot Gault, LCSW Phone Number: 07/15/2023, 11:55 AM   Clinical Narrative:     Pt medically stable for dc per MD. Plan remains for dc home with resumption of HH PT.  Updated Cory at [place].  No other TOC needs for dc.  Final next level of care: Home w Home Health Services Barriers to Discharge: Barriers Resolved   Patient Goals and CMS Choice      Discharge Placement                         Discharge Plan and Services Additional resources added to the After Visit Summary for   In-house Referral: Clinical Social Work   Post Acute Care Choice: Resumption of Svcs/PTA Provider                               Social Determinants of Health (SDOH) Interventions SDOH Screenings   Food Insecurity: No Food Insecurity (07/12/2023)  Housing: Low Risk  (07/12/2023)  Transportation Needs: No Transportation Needs (07/12/2023)  Utilities: Not At Risk (07/12/2023)  Depression (PHQ2-9): Low Risk  (12/12/2020)  Physical Activity: Inactive (12/12/2020)  Tobacco Use: Medium Risk (07/12/2023)     Readmission Risk Interventions    07/13/2023   11:15 AM 05/24/2023    2:10 PM 08/28/2021    1:06 PM  Readmission Risk Prevention Plan  Transportation Screening Complete Complete Complete  HRI or Home Care Consult  Complete Complete  Social Work Consult for Recovery Care Planning/Counseling  Complete Complete  Palliative Care Screening  Complete Not Applicable  Medication Review [employer]) Complete Complete Complete  HRI or Home Care Consult Complete    SW Recovery Care/Counseling Consult Complete    Palliative Care Screening Not Applicable    Skilled Nursing Facility Not Applicable

## 2023-07-15 NOTE — Care Management Important Message (Signed)
Important Message  Patient Details  Name: Lisa [name] MRN: [id number] Date of Birth: 04-04-1934   Medicare Important Message Given:  Yes     Corey Harold 07/15/2023, 1:22 PM

## 2023-07-15 NOTE — Progress Notes (Addendum)
Rounding Note    Patient Name: Lisa [name] Date of Encounter: 07/15/2023  [facility] Cardiologist: Marjo Bicker, MD   Subjective   Feels well. Ambulated in the hallway yesterday with PT and denies any symptoms with this. No recent chest pain or palpitations. Anxious to go home.   Inpatient Medications    Scheduled Meds:  amiodarone  200 mg Oral BID   aspirin EC  81 mg Oral Daily   Chlorhexidine Gluconate Cloth  6 each Topical Q0600   enoxaparin (LOVENOX) injection  30 mg Subcutaneous Q24H   isosorbide mononitrate  60 mg Oral Daily   levothyroxine  100 mcg Oral QAC breakfast   magnesium oxide  200 mg Oral QHS   melatonin  3 mg Oral QHS   metoprolol succinate  25 mg Oral Daily   multivitamin with minerals  1 tablet Oral Daily   pantoprazole  40 mg Oral BID   pravastatin  40 mg Oral QPM   psyllium  1 packet Oral Daily   senna  1 tablet Oral QHS    PRN Meds: acetaminophen **OR** acetaminophen, hydrALAZINE, ipratropium-albuterol, ondansetron **OR** ondansetron (ZOFRAN) IV   Vital Signs    Vitals:   07/14/23 2055 07/14/23 2210 07/15/23 0422 07/15/23 0826  BP: 139/63 131/88 (!) 118/55 138/63  Pulse: 63 65 (!) 54 65  Resp: 20 20 18    Temp: 97.9 F (36.6 C) 98.2 F (36.8 C) 98.3 F (36.8 C)   TempSrc: Oral Oral    SpO2: 99% 100% 100%   Weight:   66.1 kg   Height:        Intake/Output Summary (Last 24 hours) at 07/15/2023 1031 Last data filed at 07/15/2023 0900 Gross per 24 hour  Intake 514.99 ml  Output 1050 ml  Net -535.01 ml      07/15/2023    4:22 AM 07/14/2023    5:00 AM 07/13/2023    4:03 AM  Last 3 Weights  Weight (lbs) 145 lb 11.6 oz 154 lb 9.6 oz 147 lb 14.9 oz  Weight (kg) 66.1 kg 70.126 kg 67.1 kg      Telemetry    NSR, HR in 50's to 60's. Occasional PVC's and couplets.  - Personally Reviewed  ECG    No new tracings.   Physical Exam   GEN: Pleasant, elderly female appearing in no acute distress.   Neck: No  JVD Cardiac: RRR, 3/6 SEM along RUSB.  Respiratory: Scattered rhonchi. No rales or wheezing. GI: Soft, nontender, non-distended  MS: No pitting edema; No deformity. Neuro:  Nonfocal  Psych: Normal affect   Labs    High Sensitivity Troponin:   Recent Labs  Lab 07/12/23 0820 07/12/23 1001 07/12/23 1259  TROPONINIHS 44* 2,997* >24,000*     Chemistry Recent Labs  Lab 07/12/23 0820 07/13/23 0426 07/14/23 0440 07/15/23 0401  NA 138 133* 129* 132*  K 5.0 5.2* 4.5 4.3  CL 102 98 94* 96*  CO2 27 26 28 29   GLUCOSE 183* 131* 97 76  BUN 17 28* 27* 26*  CREATININE 1.09* 1.07* 1.01* 1.03*  CALCIUM 9.1 8.7* 9.0 9.2  MG 2.0 1.6* 2.1  --   PROT 8.6* 8.3* 7.7  --   ALBUMIN 3.6 3.4* 3.2*  --   AST 25 95* 55*  --   ALT 13 18 15   --   ALKPHOS 54 40 36*  --   BILITOT 0.7 0.9 0.7  --   GFRNONAA 49* 50* 54* 53*  ANIONGAP  9 9 7 7     Lipids  Recent Labs  Lab 07/13/23 0426  CHOL 145  TRIG 51  HDL 49  LDLCALC 86  CHOLHDL 3.0    Hematology Recent Labs  Lab 07/13/23 0426 07/14/23 0440 07/15/23 0401  WBC 1.3* 2.9* 3.7*  RBC 3.17* 2.75* 2.86*  HGB 9.6* 8.4* 8.7*  HCT 32.8* 27.5* 29.6*  MCV 103.5* 100.0 103.5*  MCH 30.3 30.5 30.4  MCHC 29.3* 30.5 29.4*  RDW 17.4* 17.7* 18.1*  PLT 90* 95* 99*   BNP Recent Labs  Lab 07/12/23 0820  BNP 571.0*   Radiology    No results found.  Cardiac Studies   Echocardiogram: 07/12/2023 IMPRESSIONS    1. Left ventricular ejection fraction, by estimation, is 60 to 65%. The  left ventricle has normal function. The left ventricle has no regional  wall motion abnormalities. There is moderate left ventricular hypertrophy.  Left ventricular diastolic  parameters are consistent with Grade II diastolic dysfunction  (pseudonormalization). Elevated left atrial pressure.   2. Right ventricular systolic function is normal. The right ventricular  size is mildly enlarged. Tricuspid regurgitation signal is inadequate for  assessing PA  pressure.   3. Left atrial size was severely dilated.   4. Right atrial size was mildly dilated.   5. The mitral valve is abnormal. Mild to moderate mitral valve  regurgitation. No evidence of mitral stenosis.   6. The tricuspid valve is abnormal.   7. The aortic valve is tricuspid. There is severe calcifcation of the  aortic valve. There is severe thickening of the aortic valve. Aortic valve  regurgitation is not visualized. Moderate to severe aortic valve stenosis.  Aortic valve mean gradient  measures 22.0 mmHg. Aortic valve peak gradient measures 41.9 mmHg. Aortic  valve area, by VTI measures 0.81 cm.   8. The inferior vena cava is normal in size with <50% respiratory  variability, suggesting right atrial pressure of 8 mmHg.    Patient Profile     89 y.o. female w/ PMH of paroxysmal atrial fibrillation (not on anticoagulation due to prior GIB and MDS with thrombocytopenia), presumed CAD (medically managed NSTEMI in 04/2023), aortic stenosis, mitral valve regurgitation, COPD, chronic HFpEF, severe pulmonary hypertension, MDS/CMML, history of breast cancer, history of lung cancer and AAA (not felt to be a surgical candidate) who is currently admitted for an NSTEMI.  Assessment & Plan   1. NSTEMI - She experienced a prior admission earlier this year with an NSTEMI and medical management was pursued as she was not a candidate for antiplatelet therapy or anticoagulation. Found to have a recurrent NSTEMI this admission and troponin values have peaked at greater than 24,000. Echocardiogram shows a preserved EF of 60 to 65% with no regional wall motion abnormalities. - She has been medically managed this admission and completed 48 hours of Heparin. Plans at this time are to continue with medical management including ASA 81 mg daily (if able to tolerate), Imdur 60 mg daily, Toprol-XL 25 mg daily and Pravachol 40 mg daily.  2. Aortic stenosis - This is moderate to severe by repeat  echocardiogram this admission. Not a candidate for intervention and conservative management has been recommended.  3. Pulmonary hypertension - Felt to be mixed group 2/3 and not felt to be a candidate for advanced therapies.  She has been diuresed this admission and appears euvolemic by examination today.  4.  Chronic HFpEF - Echo this admission shows a preserved EF of 60 to 65% with grade  2 diastolic dysfunction. Reds Vest improved to 21 when checked 07/14/2023. Creatinine had peaked at 1.09 but has now improved to 1.03. Na+ also improving at 132. Would hold on additional diuretics at this time. She was also previously not felt to be an ideal candidate for SGLT2 inhibitors given her infection risk and advanced age.  5. Paroxysmal Atrial Fibrillation - She has not been on anticoagulation given her history of GI bleeding and known MDS with thrombocytopenia. Also not felt to be a candidate for Watchman device placement.   - Given her recurrent arrhythmias, she has been started on Amiodarone 200 mg twice daily for rhythm suppression. Would anticipate reducing to once daily dosing after total of 7 days.   For questions or updates, please contact [facility] Please consult [email or web address] for contact info under      Signed, Ellsworth Lennox, PA-C  07/15/2023, 10:31 AM     Attending note:  Patient seen and examined.  Reviewed interval chart.  Patient ambulated yesterday, reports no recurrent angina and has had no palpitations or recurring atrial fibrillation per review of telemetry.  She is anxious to go home.  On examination she is afebrile, heart rate is in the 60s to 70s in sinus rhythm, blood pressure 138/63.  Lungs are clear with decreased breath sounds.  Cardiac exam with RRR 3/6 systolic murmur as before.  Pertinent lab work includes potassium 4.3, creatinine 1.03, hemoglobin 8.7 and platelets 99.  Reasonable for discharge home from a cardiac perspective.  Would continue  aspirin 81 mg daily, Imdur 60 mg daily, Toprol-XL 25 mg daily, Pravachol 40 mg daily, and amiodarone 200 mg twice daily for a total of 7 days then convert to once daily.  We will arrange outpatient follow-up.  Jonelle Sidle, M.D., F.A.C.C.

## 2023-07-15 NOTE — Discharge Summary (Signed)
Physician Discharge Summary  Lisa [name] UYQ:[id number] DOB: 1933-11-19 DOA: 07/12/2023  PCP: [name], MD  Admit date: 07/12/2023 Discharge date: 07/15/2023  Disposition:  Home with Home Health  Recommendations for Outpatient Follow-up:  Follow up with PCP in 1 weeks Follow up with cardiology on 08/06/23 as scheduled  Follow up with Dr. [name] on 10/05/23 as scheduled Please check CBC in 1-2 weeks to follow up hemoglobin  Home Health: Physical Therapy   Discharge Condition: STABLE   CODE STATUS: FULL DIET: heart healthy recommended    Brief Hospitalization Summary: Please see all hospital notes, images, labs for full details of the hospitalization. Admission Provider HPI:  89 y.o. female with medical history significant for chronic HFpEF, CKD stage IIIb, severe pulmonary hypertension, moderate AS/mild MR, AAA, NSTEMI, TIA, paroxysmal atrial fibrillation-not on anticoagulation, hypothyroidism, and COPD with chronic hypoxemia 2-3 L baseline nasal cannula, who presented to the ED with worsening respiratory distress that began last night and worsened this morning.  She is noted to have acute hypoxemic respiratory failure with bilateral pleural effusion in the setting of NSTEMI and remains on heparin drip.  Cardiology following and recommending a total 48-hour course of IV heparin.   Hospital Course by Problem list   Acute on chronic hypoxic respiratory failure in the setting of NSTEMI with bilateral pleural effusion NSTEMI - medically managed by inpatient cardiology service  -Reds clip reading 23% which is reassuring  -completed IV furosemide treatment  -Pt was treated with IV heparin drip for total 48 hours per cardiology service   -Pt treated with aspirin 81 mg, Imdur 60 mg, Toprol-XL 25 mg, and Pravachol increased to 40 mg  -Back to baseline supplemental oxygen requirements  -pt has outpatient follow up with cardiology on 08/06/23  -bleeding precautions given with written  information at discharge    History of COPD -No significant exacerbation currently noted, discontinue steroids -DuoNebs as needed -resume home bronchodilators    Severe pulmonary hypertension -Appears to be group 2 with recommendation to continue cardiac and lung conditions   CKD stage IIIb -Continue to monitor creatinine carefully with diuresis and appears to be at baseline   Paroxysmal atrial fibrillation -not a candidate for OAC due to history of serious GI bleed -Continue metoprolol for HR control  -Amiodarone loading 200 mg twice daily per cardiology to better suppress atrial fibrillation -cardiology recommended amiodarone 200 mg BID x 7 days, then 200 mg daily  -follow up with cardiology on 08/06/23    Mild hyperkalemia -treated and resolved now   Hypomagnesemia -Repleted   Aortic stenosis/mitral regurgitation -Not a candidate for valvular surgery due to myelodysplastic syndrome   MDS/CMML with neutropenia and thrombocytopenia -Follow up with Dr. [name] outpatient on 10/05/23   Hypothyroidism -Continue levothyroxine   Prior TIA and NSTEMI -Managed medically with statin -Continue metoprolol and Imdur -Not on antiplatelets or anticoagulation due to prior GI bleed, but started on aspirin 81 mg daily per cardiology.  This can be closely monitored outpatient.   4.8 cm AAA -Noted not to be a surgical candidate by vascular surgery   Discharge Diagnoses:  Principal Problem:   Acute on chronic hypoxic respiratory failure (HCC) Active Problems:   History of non-ST elevation myocardial infarction (NSTEMI)   COPD with acute exacerbation (HCC)   Abdominal aortic aneurysm   Hypothyroidism   HTN (hypertension)   Anemia of chronic disease   Myelodysplastic syndrome (HCC)   Acute on chronic diastolic CHF (congestive heart failure) (HCC)   Pancytopenia (HCC)  Mitral regurgitation   CAD (coronary artery disease)   CMML (chronic myelomonocytic leukemia)  (HCC)   Discharge Instructions:  Allergies as of 07/15/2023       Reactions   Meloxicam Other (See Comments)   Caused an injury to the kidneys, per nephrologist   Micardis Hct [telmisartan-hctz] Other (See Comments)   Caused an injury to the kidneys, per nephrologist        Medication List     TAKE these medications    acetaminophen 325 MG tablet Commonly known as: TYLENOL Take 1.5-3 tablets (487.5-975 mg total) by mouth every 6 (six) hours as needed for mild pain (or Fever >/= 101). What changed: how much to take   albuterol 108 (90 Base) MCG/ACT inhaler Commonly known as: VENTOLIN HFA Inhale 1-2 puffs into the lungs every 4 (four) hours as needed for wheezing or shortness of breath.   amiodarone 200 MG tablet Commonly known as: PACERONE Take 1 po BID thru 9/17, then 1 po daily   aspirin EC 81 MG tablet Take 1 tablet (81 mg total) by mouth daily. Swallow whole. Start taking on: July 16, 2023   isosorbide mononitrate 60 MG 24 hr tablet Commonly known as: IMDUR Take 1 tablet (60 mg total) by mouth daily.   levothyroxine 100 MCG tablet Commonly known as: SYNTHROID Take 1 tablet (100 mcg total) by mouth daily before breakfast.   magnesium oxide 400 MG tablet Commonly known as: MAG-OX Take 0.5 tablets (200 mg total) by mouth at bedtime.   melatonin 3 MG Tabs tablet Take 1 tablet (3 mg total) by mouth at bedtime.   metoprolol succinate 25 MG 24 hr tablet Commonly known as: TOPROL-XL TAKE 1 TABLET DAILY   mirtazapine 15 MG tablet Commonly known as: REMERON Take 1 tablet (15 mg total) by mouth at bedtime.   Multi-Vitamin tablet Take 1 tablet by mouth daily.   pantoprazole 40 MG tablet Commonly known as: PROTONIX Take 1 tablet (40 mg total) by mouth 2 (two) times daily.   pravastatin 40 MG tablet Commonly known as: PRAVACHOL Take 1 tablet (40 mg total) by mouth every evening. What changed:  medication strength how much to take   psyllium 95 %  Pack Commonly known as: HYDROCIL/METAMUCIL Take 1 packet by mouth daily.   senna 8.6 MG Tabs tablet Commonly known as: SENOKOT Take 1 tablet (8.6 mg total) by mouth at bedtime.   Trelegy Ellipta 100-62.5-25 MCG/ACT Aepb Generic drug: Fluticasone-Umeclidin-Vilant Inhale 1 puff into the lungs daily.        Follow-up Information     Ellsworth Lennox, PA-C Follow up on 08/06/2023.   Specialties: Cardiology, Cardiology Why: Cardiology Hospital Follow-up on 08/06/2023 at 3:30 PM. Contact information: [address] [place] [address] [phone]         [name], MD. Schedule an appointment as soon as possible for a visit in 1 week(s).   Specialty: Internal Medicine Why: Hospital Follow Up Contact information: [address] [place] [facility] [address] [phone]         Doreatha Massed, MD. Go on 10/05/2023.   Specialty: Hematology Contact information: [address] [place] [address] [phone]                Allergies  Allergen Reactions   Meloxicam Other (See Comments)    Caused an injury to the kidneys, per nephrologist   Micardis Hct [Telmisartan-Hctz] Other (See Comments)    Caused an injury to the kidneys,  per nephrologist   Allergies as of 07/15/2023       Reactions   Meloxicam Other (See Comments)   Caused an injury to the kidneys, per nephrologist   Micardis Hct [telmisartan-hctz] Other (See Comments)   Caused an injury to the kidneys, per nephrologist        Medication List     TAKE these medications    acetaminophen 325 MG tablet Commonly known as: TYLENOL Take 1.5-3 tablets (487.5-975 mg total) by mouth every 6 (six) hours as needed for mild pain (or Fever >/= 101). What changed: how much to take   albuterol 108 (90 Base) MCG/ACT inhaler Commonly known as: VENTOLIN HFA Inhale 1-2 puffs into the lungs every 4 (four) hours as needed for wheezing or shortness of breath.   amiodarone 200 MG tablet Commonly known  as: PACERONE Take 1 po BID thru 9/17, then 1 po daily   aspirin EC 81 MG tablet Take 1 tablet (81 mg total) by mouth daily. Swallow whole. Start taking on: July 16, 2023   isosorbide mononitrate 60 MG 24 hr tablet Commonly known as: IMDUR Take 1 tablet (60 mg total) by mouth daily.   levothyroxine 100 MCG tablet Commonly known as: SYNTHROID Take 1 tablet (100 mcg total) by mouth daily before breakfast.   magnesium oxide 400 MG tablet Commonly known as: MAG-OX Take 0.5 tablets (200 mg total) by mouth at bedtime.   melatonin 3 MG Tabs tablet Take 1 tablet (3 mg total) by mouth at bedtime.   metoprolol succinate 25 MG 24 hr tablet Commonly known as: TOPROL-XL TAKE 1 TABLET DAILY   mirtazapine 15 MG tablet Commonly known as: REMERON Take 1 tablet (15 mg total) by mouth at bedtime.   Multi-Vitamin tablet Take 1 tablet by mouth daily.   pantoprazole 40 MG tablet Commonly known as: PROTONIX Take 1 tablet (40 mg total) by mouth 2 (two) times daily.   pravastatin 40 MG tablet Commonly known as: PRAVACHOL Take 1 tablet (40 mg total) by mouth every evening. What changed:  medication strength how much to take   psyllium 95 % Pack Commonly known as: HYDROCIL/METAMUCIL Take 1 packet by mouth daily.   senna 8.6 MG Tabs tablet Commonly known as: SENOKOT Take 1 tablet (8.6 mg total) by mouth at bedtime.   Trelegy Ellipta 100-62.5-25 MCG/ACT Aepb Generic drug: Fluticasone-Umeclidin-Vilant Inhale 1 puff into the lungs daily.        Procedures/Studies: ECHOCARDIOGRAM COMPLETE  Result Date: 07/12/2023    ECHOCARDIOGRAM REPORT   Patient Name:   Lisa [name] Date of Exam: 07/12/2023 Medical Rec #:  [id number]      Height:       63.0 in Accession #:    [id number]     Weight:       147.0 lb Date of Birth:  [birth year]      BSA:          1.697 m Patient Age:    89 years       BP:           132/41 mmHg Patient Gender: F              HR:           65 bpm. [facility] Procedure: 2D Echo, Cardiac Doppler, Color Doppler and Strain Analysis Indications:    Dyspnea  History:        Patient has prior history of Echocardiogram examinations, most  recent 05/20/2023. CHF, CAD and Previous Myocardial Infarction,                 TIA and COPD, Arrythmias:Atrial Fibrillation,                 Signs/Symptoms:Dyspnea; Risk Factors:Hypertension and Former                 Smoker. Breast and Lung CA.  Sonographer:    Mikki Harbor Referring Phys: [id number] Dorothe Pea BRANCH  Sonographer Comments: Global longitudinal strain was attempted. IMPRESSIONS  1. Left ventricular ejection fraction, by estimation, is 60 to 65%. The left ventricle has normal function. The left ventricle has no regional wall motion abnormalities. There is moderate left ventricular hypertrophy. Left ventricular diastolic parameters are consistent with Grade II diastolic dysfunction (pseudonormalization). Elevated left atrial pressure.  2. Right ventricular systolic function is normal. The right ventricular size is mildly enlarged. Tricuspid regurgitation signal is inadequate for assessing PA pressure.  3. Left atrial size was severely dilated.  4. Right atrial size was mildly dilated.  5. The mitral valve is abnormal. Mild to moderate mitral valve regurgitation. No evidence of mitral stenosis.  6. The tricuspid valve is abnormal.  7. The aortic valve is tricuspid. There is severe calcifcation of the aortic valve. There is severe thickening of the aortic valve. Aortic valve regurgitation is not visualized. Moderate to severe aortic valve stenosis. Aortic valve mean gradient measures 22.0 mmHg. Aortic valve peak gradient measures 41.9 mmHg. Aortic valve area, by VTI measures 0.81 cm.  8. The inferior vena cava is normal in size with <50% respiratory variability, suggesting right atrial pressure of 8 mmHg. FINDINGS  Left Ventricle: Left ventricular ejection fraction, by estimation, is 60 to 65%. The left  ventricle has normal function. The left ventricle has no regional wall motion abnormalities. Global longitudinal strain performed but not reported based on interpreter judgement due to suboptimal tracking. The left ventricular internal cavity size was normal in size. There is moderate left ventricular hypertrophy. Left ventricular diastolic parameters are consistent with Grade II diastolic dysfunction (pseudonormalization). Elevated left atrial pressure. Right Ventricle: The right ventricular size is mildly enlarged. Right vetricular wall thickness was not well visualized. Right ventricular systolic function is normal. Tricuspid regurgitation signal is inadequate for assessing PA pressure. Left Atrium: Left atrial size was severely dilated. Right Atrium: Right atrial size was mildly dilated. Pericardium: There is no evidence of pericardial effusion. Mitral Valve: The mitral valve is abnormal. There is moderate thickening of the mitral valve leaflet(s). There is moderate calcification of the mitral valve leaflet(s). Mild to moderate mitral annular calcification. Mild to moderate mitral valve regurgitation. No evidence of mitral valve stenosis. MV peak gradient, 6.2 mmHg. The mean mitral valve gradient is 2.0 mmHg. Tricuspid Valve: The tricuspid valve is abnormal. Tricuspid valve regurgitation is mild . No evidence of tricuspid stenosis. Aortic Valve: The aortic valve is tricuspid. There is severe calcifcation of the aortic valve. There is severe thickening of the aortic valve. There is severe aortic valve annular calcification. Aortic valve regurgitation is not visualized. Moderate to severe aortic stenosis is present. Aortic valve mean gradient measures 22.0 mmHg. Aortic valve peak gradient measures 41.9 mmHg. Aortic valve area, by VTI measures 0.81 cm. Pulmonic Valve: The pulmonic valve was not well visualized. Pulmonic valve regurgitation is mild to moderate. No evidence of pulmonic stenosis. Aorta: The aortic  root and ascending aorta are structurally normal, with no evidence of dilitation. Venous: The inferior vena cava is  normal in size with less than 50% respiratory variability, suggesting right atrial pressure of 8 mmHg. IAS/Shunts: No atrial level shunt detected by color flow Doppler.  LEFT VENTRICLE PLAX 2D LVIDd:         4.40 cm   Diastology LVIDs:         2.70 cm   LV e' medial:    5.43 cm/s LV PW:         1.30 cm   LV E/e' medial:  20.6 LV IVS:        1.40 cm   LV e' lateral:   8.18 cm/s LVOT diam:     2.00 cm   LV E/e' lateral: 13.7 LV SV:         69 LV SV Index:   40 LVOT Area:     3.14 cm  RIGHT VENTRICLE RV Basal diam:  3.80 cm RV Mid diam:    3.40 cm RV S prime:     11.40 cm/s TAPSE (M-mode): 2.3 cm LEFT ATRIUM             Index        RIGHT ATRIUM           Index LA diam:        4.00 cm 2.36 cm/m   RA Area:     19.70 cm LA Vol (A2C):   85.3 ml 50.27 ml/m  RA Volume:   61.80 ml  36.42 ml/m LA Vol (A4C):   77.4 ml 45.62 ml/m LA Biplane Vol: 87.7 ml 51.69 ml/m  AORTIC VALVE                     PULMONIC VALVE AV Area (Vmax):    0.87 cm      PV Vmax:          0.94 m/s AV Area (Vmean):   0.77 cm      PV Peak grad:     3.5 mmHg AV Area (VTI):     0.81 cm      PR End Diast Vel: 2.60 msec AV Vmax:           323.50 cm/s AV Vmean:          219.000 cm/s AV VTI:            0.845 m AV Peak Grad:      41.9 mmHg AV Mean Grad:      22.0 mmHg LVOT Vmax:         90.05 cm/s LVOT Vmean:        53.750 cm/s LVOT VTI:          0.218 m LVOT/AV VTI ratio: 0.26  AORTA Ao Root diam: 3.40 cm Ao Asc diam:  3.20 cm MITRAL VALVE MV Area (PHT): 4.15 cm       SHUNTS MV Area VTI:   2.03 cm       Systemic VTI:  0.22 m MV Peak grad:  6.2 mmHg       Systemic Diam: 2.00 cm MV Mean grad:  2.0 mmHg MV Vmax:       1.24 m/s MV Vmean:      60.2 cm/s MV Decel Time: 183 msec MR Peak grad:    137.4 mmHg MR Mean grad:    88.0 mmHg MR Vmax:         586.00 cm/s MR Vmean:        439.0 cm/s MR PISA:  1.57 cm MR PISA Eff ROA: 25 mm MR  PISA Radius:  0.50 cm MV E velocity: 112.00 cm/s MV A velocity: 60.00 cm/s MV E/A ratio:  1.87 Dina Rich MD Electronically signed by Dina Rich MD Signature Date/Time: 07/12/2023/1:08:53 PM    Final    DG Chest Portable 1 View  Result Date: 07/12/2023 CLINICAL DATA:  89 year old female with difficulty breathing onset 0200 hours. Atrial fibrillation, lung cancer. EXAM: PORTABLE CHEST 1 VIEW COMPARISON:  Chest CTA 11/03/2022. Radiographs 06/09/2023 and earlier. FINDINGS: Portable AP upright view at 0838 hours. Emphysema demonstrated by CTA last year. Cardiomegaly. Calcified aortic atherosclerosis. Other mediastinal contours are within normal limits. Confluent and veiling bilateral lung base opacity appears only in part due to pleural effusions. There is some generalized increase in the pulmonary interstitium, vasculature appears indistinct. No pneumothorax. Visualized tracheal air column is within normal limits. No acute osseous abnormality identified. Chronic postoperative changes to the left axilla, chest wall. Paucity of bowel gas in the visible abdomen. IMPRESSION: 1. Emphysema with confluent, indeterminate bibasilar opacity which seems only in part due to layering pleural effusions. Increased interstitium/vascularity elsewhere. Consider acute pulmonary edema and bilateral pleural effusions and atelectasis. Viral/atypical infection or lung base aspiration difficult to exclude. 2. Cardiomegaly, calcified aortic atherosclerosis. Electronically Signed   By: Odessa Fleming M.D.   On: 07/12/2023 09:05     Subjective: Pt says she feels well and she is eager to get home. She says no CP or SOB.  She has no black stool or blood in stool.    Discharge Exam: Vitals:   07/15/23 0422 07/15/23 0826  BP: (!) 118/55 138/63  Pulse: (!) 54 65  Resp: 18   Temp: 98.3 F (36.8 C)   SpO2: 100%    Vitals:   07/14/23 2055 07/14/23 2210 07/15/23 0422 07/15/23 0826  BP: 139/63 131/88 (!) 118/55 138/63  Pulse: 63 65  (!) 54 65  Resp: 20 20 18    Temp: 97.9 F (36.6 C) 98.2 F (36.8 C) 98.3 F (36.8 C)   TempSrc: Oral Oral    SpO2: 99% 100% 100%   Weight:   66.1 kg   Height:       General: Pt is alert, awake, not in acute distress, cooperative Cardiovascular: normal S1/S2 +, no rubs, no gallops Respiratory: CTA bilaterally, no wheezing, no rhonchi Abdominal: Soft, NT, ND, bowel sounds + Extremities: no edema, no cyanosis   The results of significant diagnostics from this hospitalization (including imaging, microbiology, ancillary and laboratory) are listed below for reference.     Microbiology: Recent Results (from the past 240 hour(s))  Resp panel by RT-PCR (RSV, Flu A&B, Covid) Anterior Nasal Swab     Status: None   Collection Time: 07/12/23  8:40 AM   Specimen: Anterior Nasal Swab  Result Value Ref Range Status   SARS Coronavirus 2 by RT PCR NEGATIVE NEGATIVE Final    Comment: (NOTE) SARS-CoV-2 target nucleic acids are NOT DETECTED.  The SARS-CoV-2 RNA is generally detectable in upper respiratory specimens during the acute phase of infection. The lowest concentration of SARS-CoV-2 viral copies this assay can detect is 138 copies/mL. A negative result does not preclude SARS-Cov-2 infection and should not be used as the sole basis for treatment or other patient management decisions. A negative result may occur with  improper specimen collection/handling, submission of specimen other than nasopharyngeal swab, presence of viral mutation(s) within the areas targeted by this assay, and inadequate number of viral copies(<138 copies/mL). A negative  result must be combined with clinical observations, patient history, and epidemiological information. The expected result is Negative.  Fact Sheet for Patients:  BloggerCourse.com  Fact Sheet for Healthcare Providers:  SeriousBroker.it  This test is no t yet approved or cleared by the [place] FDA and  has been authorized for detection and/or diagnosis of SARS-CoV-2 by FDA under an Emergency Use Authorization (EUA). This EUA will remain  in effect (meaning this test can be used) for the duration of the COVID-19 declaration under Section 564(b)(1) of the Act, 21 U.S.C.section 360bbb-3(b)(1), unless the authorization is terminated  or revoked sooner.       Influenza A by PCR NEGATIVE NEGATIVE Final   Influenza B by PCR NEGATIVE NEGATIVE Final    Comment: (NOTE) The Xpert Xpress SARS-CoV-2/FLU/RSV plus assay is intended as an aid in the diagnosis of influenza from Nasopharyngeal swab specimens and should not be used as a sole basis for treatment. Nasal washings and aspirates are unacceptable for Xpert Xpress SARS-CoV-2/FLU/RSV testing.  Fact Sheet for Patients: BloggerCourse.com  Fact Sheet for Healthcare Providers: SeriousBroker.it  This test is not yet approved or cleared by the [place] FDA and has been authorized for detection and/or diagnosis of SARS-CoV-2 by FDA under an Emergency Use Authorization (EUA). This EUA will remain in effect (meaning this test can be used) for the duration of the COVID-19 declaration under Section 564(b)(1) of the Act, 21 U.S.C. section 360bbb-3(b)(1), unless the authorization is terminated or revoked.     Resp Syncytial Virus by PCR NEGATIVE NEGATIVE Final    Comment: (NOTE) Fact Sheet for Patients: BloggerCourse.com  Fact Sheet for Healthcare Providers: SeriousBroker.it  This test is not yet approved or cleared by the [place] FDA and has been authorized for detection and/or diagnosis of SARS-CoV-2 by FDA under an Emergency Use Authorization (EUA). This EUA will remain in effect (meaning this test can be used) for the duration of the COVID-19 declaration under Section 564(b)(1) of the Act, 21 U.S.C. section  360bbb-3(b)(1), unless the authorization is terminated or revoked.  Performed at [facility], [address] [place], [place] [address]   MRSA Next Gen by PCR, Nasal     Status: None   Collection Time: 07/12/23 10:41 AM   Specimen: Nasal Mucosa; Nasal Swab  Result Value Ref Range Status   MRSA by PCR Next Gen NOT DETECTED NOT DETECTED Final    Comment: (NOTE) The GeneXpert MRSA Assay (FDA approved for NASAL specimens only), is one component of a comprehensive MRSA colonization surveillance program. It is not intended to diagnose MRSA infection nor to guide or monitor treatment for MRSA infections. Test performance is not FDA approved in patients less than 27 years old. Performed at [facility], [address], [place], [place] [address]      Labs: BNP (last 3 results) Recent Labs    11/03/22 0744 05/20/23 0247 07/12/23 0820  BNP 743.0* 1,095.0* 571.0*   Basic Metabolic Panel: Recent Labs  Lab 07/12/23 0820 07/13/23 0426 07/14/23 0440 07/15/23 0401  NA 138 133* 129* 132*  K 5.0 5.2* 4.5 4.3  CL 102 98 94* 96*  CO2 27 26 28 29   GLUCOSE 183* 131* 97 76  BUN 17 28* 27* 26*  CREATININE 1.09* 1.07* 1.01* 1.03*  CALCIUM 9.1 8.7* 9.0 9.2  MG 2.0 1.6* 2.1  --    Liver Function Tests: Recent Labs  Lab 07/12/23 0820 07/13/23 0426 07/14/23 0440  AST 25 95* 55*  ALT 13 18  15  ALKPHOS 54 40 36*  BILITOT 0.7 0.9 0.7  PROT 8.6* 8.3* 7.7  ALBUMIN 3.6 3.4* 3.2*   No results for input(s): "LIPASE", "AMYLASE" in the last 168 hours. No results for input(s): "AMMONIA" in the last 168 hours. CBC: Recent Labs  Lab 07/12/23 0820 07/13/23 0426 07/14/23 0440 07/15/23 0401  WBC 3.8* 1.3* 2.9* 3.7*  NEUTROABS 1.3*  --   --   --   HGB 10.6* 9.6* 8.4* 8.7*  HCT 38.1 32.8* 27.5* 29.6*  MCV 110.1* 103.5* 100.0 103.5*  PLT 80* 90* 95* 99*   Cardiac Enzymes: No results for input(s): "CKTOTAL", "CKMB", "CKMBINDEX", "TROPONINI" in the last 168 hours. BNP: Invalid input(s):  "POCBNP" CBG: Recent Labs  Lab 07/12/23 1140 07/12/23 1612  GLUCAP 77 117*   D-Dimer No results for input(s): "DDIMER" in the last 72 hours. Hgb A1c No results for input(s): "HGBA1C" in the last 72 hours. Lipid Profile Recent Labs    07/13/23 0426  CHOL 145  HDL 49  LDLCALC 86  TRIG 51  CHOLHDL 3.0   Thyroid function studies No results for input(s): "TSH", "T4TOTAL", "T3FREE", "THYROIDAB" in the last 72 hours.  Invalid input(s): "FREET3" Anemia work up No results for input(s): "VITAMINB12", "FOLATE", "FERRITIN", "TIBC", "IRON", "RETICCTPCT" in the last 72 hours. Urinalysis    Component Value Date/Time   COLORURINE YELLOW 05/31/2023 0151   APPEARANCEUR CLEAR 05/31/2023 0151   LABSPEC 1.008 05/31/2023 0151   PHURINE 5.0 05/31/2023 0151   GLUCOSEU NEGATIVE 05/31/2023 0151   HGBUR SMALL (A) 05/31/2023 0151   BILIRUBINUR NEGATIVE 05/31/2023 0151   KETONESUR NEGATIVE 05/31/2023 0151   PROTEINUR NEGATIVE 05/31/2023 0151   NITRITE NEGATIVE 05/31/2023 0151   LEUKOCYTESUR NEGATIVE 05/31/2023 0151   Sepsis Labs Recent Labs  Lab 07/12/23 0820 07/13/23 0426 07/14/23 0440 07/15/23 0401  WBC 3.8* 1.3* 2.9* 3.7*   Microbiology Recent Results (from the past 240 hour(s))  Resp panel by RT-PCR (RSV, Flu A&B, Covid) Anterior Nasal Swab     Status: None   Collection Time: 07/12/23  8:40 AM   Specimen: Anterior Nasal Swab  Result Value Ref Range Status   SARS Coronavirus 2 by RT PCR NEGATIVE NEGATIVE Final    Comment: (NOTE) SARS-CoV-2 target nucleic acids are NOT DETECTED.  The SARS-CoV-2 RNA is generally detectable in upper respiratory specimens during the acute phase of infection. The lowest concentration of SARS-CoV-2 viral copies this assay can detect is 138 copies/mL. A negative result does not preclude SARS-Cov-2 infection and should not be used as the sole basis for treatment or other patient management decisions. A negative result may occur with  improper  specimen collection/handling, submission of specimen other than nasopharyngeal swab, presence of viral mutation(s) within the areas targeted by this assay, and inadequate number of viral copies(<138 copies/mL). A negative result must be combined with clinical observations, patient history, and epidemiological information. The expected result is Negative.  Fact Sheet for Patients:  BloggerCourse.com  Fact Sheet for Healthcare Providers:  SeriousBroker.it  This test is no t yet approved or cleared by the [place] FDA and  has been authorized for detection and/or diagnosis of SARS-CoV-2 by FDA under an Emergency Use Authorization (EUA). This EUA will remain  in effect (meaning this test can be used) for the duration of the COVID-19 declaration under Section 564(b)(1) of the Act, 21 U.S.C.section 360bbb-3(b)(1), unless the authorization is terminated  or revoked sooner.       Influenza A by PCR NEGATIVE NEGATIVE Final  Influenza B by PCR NEGATIVE NEGATIVE Final    Comment: (NOTE) The Xpert Xpress SARS-CoV-2/FLU/RSV plus assay is intended as an aid in the diagnosis of influenza from Nasopharyngeal swab specimens and should not be used as a sole basis for treatment. Nasal washings and aspirates are unacceptable for Xpert Xpress SARS-CoV-2/FLU/RSV testing.  Fact Sheet for Patients: BloggerCourse.com  Fact Sheet for Healthcare Providers: SeriousBroker.it  This test is not yet approved or cleared by the [place] FDA and has been authorized for detection and/or diagnosis of SARS-CoV-2 by FDA under an Emergency Use Authorization (EUA). This EUA will remain in effect (meaning this test can be used) for the duration of the COVID-19 declaration under Section 564(b)(1) of the Act, 21 U.S.C. section 360bbb-3(b)(1), unless the authorization is terminated or revoked.     Resp  Syncytial Virus by PCR NEGATIVE NEGATIVE Final    Comment: (NOTE) Fact Sheet for Patients: BloggerCourse.com  Fact Sheet for Healthcare Providers: SeriousBroker.it  This test is not yet approved or cleared by the [place] FDA and has been authorized for detection and/or diagnosis of SARS-CoV-2 by FDA under an Emergency Use Authorization (EUA). This EUA will remain in effect (meaning this test can be used) for the duration of the COVID-19 declaration under Section 564(b)(1) of the Act, 21 U.S.C. section 360bbb-3(b)(1), unless the authorization is terminated or revoked.  Performed at [facility], [address] [place], [place] [address]   MRSA Next Gen by PCR, Nasal     Status: None   Collection Time: 07/12/23 10:41 AM   Specimen: Nasal Mucosa; Nasal Swab  Result Value Ref Range Status   MRSA by PCR Next Gen NOT DETECTED NOT DETECTED Final    Comment: (NOTE) The GeneXpert MRSA Assay (FDA approved for NASAL specimens only), is one component of a comprehensive MRSA colonization surveillance program. It is not intended to diagnose MRSA infection nor to guide or monitor treatment for MRSA infections. Test performance is not FDA approved in patients less than 31 years old. Performed at [facility], [address] [place], [place] [address]    Time coordinating discharge:  43 mins  SIGNED:  Maryln Manuel, MD  [facility] 07/15/2023, 12:17 PM How to contact the [facility] Attending or Consulting provider 7A - 7P or covering provider during after hours 7P -7A, for this patient?  Check the care team in [facility] and look for a) attending/consulting [facility] provider listed and b) the [facility] team listed Log into [email or web address] and use [place]'s universal password to access. If you do not have the password, please contact the hospital operator. Locate the [facility] provider you are looking for under [facility] and page to a number that you can  be directly reached. If you still have difficulty reaching the provider, please page the [facility] (Director on Call) for the Hospitalists listed on amion for assistance.

## 2023-07-20 DIAGNOSIS — D630 Anemia in neoplastic disease: Secondary | ICD-10-CM | POA: Diagnosis not present

## 2023-07-20 DIAGNOSIS — J441 Chronic obstructive pulmonary disease with (acute) exacerbation: Secondary | ICD-10-CM | POA: Diagnosis not present

## 2023-07-20 DIAGNOSIS — J9622 Acute and chronic respiratory failure with hypercapnia: Secondary | ICD-10-CM | POA: Diagnosis not present

## 2023-07-20 DIAGNOSIS — J9621 Acute and chronic respiratory failure with hypoxia: Secondary | ICD-10-CM | POA: Diagnosis not present

## 2023-07-20 DIAGNOSIS — C9310 Chronic myelomonocytic leukemia not having achieved remission: Secondary | ICD-10-CM | POA: Diagnosis not present

## 2023-07-20 DIAGNOSIS — I5031 Acute diastolic (congestive) heart failure: Secondary | ICD-10-CM | POA: Diagnosis not present

## 2023-07-23 DIAGNOSIS — D630 Anemia in neoplastic disease: Secondary | ICD-10-CM | POA: Diagnosis not present

## 2023-07-23 DIAGNOSIS — I5031 Acute diastolic (congestive) heart failure: Secondary | ICD-10-CM | POA: Diagnosis not present

## 2023-07-23 DIAGNOSIS — C9310 Chronic myelomonocytic leukemia not having achieved remission: Secondary | ICD-10-CM | POA: Diagnosis not present

## 2023-07-23 DIAGNOSIS — J9622 Acute and chronic respiratory failure with hypercapnia: Secondary | ICD-10-CM | POA: Diagnosis not present

## 2023-07-23 DIAGNOSIS — J441 Chronic obstructive pulmonary disease with (acute) exacerbation: Secondary | ICD-10-CM | POA: Diagnosis not present

## 2023-07-23 DIAGNOSIS — J9621 Acute and chronic respiratory failure with hypoxia: Secondary | ICD-10-CM | POA: Diagnosis not present

## 2023-07-26 ENCOUNTER — Encounter: Payer: Medicare Other | Admitting: Physical Medicine and Rehabilitation

## 2023-07-27 DIAGNOSIS — J441 Chronic obstructive pulmonary disease with (acute) exacerbation: Secondary | ICD-10-CM | POA: Diagnosis not present

## 2023-07-27 DIAGNOSIS — J9622 Acute and chronic respiratory failure with hypercapnia: Secondary | ICD-10-CM | POA: Diagnosis not present

## 2023-07-27 DIAGNOSIS — C9310 Chronic myelomonocytic leukemia not having achieved remission: Secondary | ICD-10-CM | POA: Diagnosis not present

## 2023-07-27 DIAGNOSIS — I5031 Acute diastolic (congestive) heart failure: Secondary | ICD-10-CM | POA: Diagnosis not present

## 2023-07-27 DIAGNOSIS — D630 Anemia in neoplastic disease: Secondary | ICD-10-CM | POA: Diagnosis not present

## 2023-07-27 DIAGNOSIS — J9621 Acute and chronic respiratory failure with hypoxia: Secondary | ICD-10-CM | POA: Diagnosis not present

## 2023-07-28 DIAGNOSIS — R627 Adult failure to thrive: Secondary | ICD-10-CM | POA: Insufficient documentation

## 2023-07-28 DIAGNOSIS — M150 Primary generalized (osteo)arthritis: Secondary | ICD-10-CM | POA: Insufficient documentation

## 2023-07-28 DIAGNOSIS — N184 Chronic kidney disease, stage 4 (severe): Secondary | ICD-10-CM | POA: Insufficient documentation

## 2023-07-28 NOTE — Progress Notes (Deleted)
Name: Lisa [name] DOB: 1933/11/04 MRN: [id number]  History of Present Illness: Lisa [name] is a 89 y.o. female who presents today as a new patient at [facility] [place]. All available relevant medical records have been reviewed. She is accompanied by ***.  Per chart review: - She has a complex medical history and has had multiple recent hospital admissions for non-urologic issues (cardiac, respiratory, etc.).   - 05/31/2017: She was noted to have urinary incontinence which was "thought to be due to inability to get to the restroom because of dyspnea" (functional incontinence).   - 11/03/2022: CT abdomen/pelvis w/ contrast showed no GU stones, masses, or hydronephrosis.  - No positive urine culture results in past 12 months found per chart review.  Today: She reports chief complaint of ***  She {Actions; denies-reports:120008} urge incontinence. She {Actions; denies-reports:120008} stress incontinence with ***cough/***laugh/***sneeze/***heavy lifting /***exercise. She reports the ***SUI / ***UUI is predominant.  She leaks *** times per ***. Wears *** ***pads/ ***diapers per day. She reports urinary incontinence {ACTION; IS/IS [id number]} significantly bothersome.   She {Actions; denies-reports:120008} increased urinary urgency, frequency, nocturia, dysuria, gross hematuria, hesitancy, straining to void, or sensations of incomplete emptying.  She {Actions; denies-reports:120008} flank pain. She {Actions; denies-reports:120008} abdominal pain.   Fall Screening: Do you usually have a device to assist in your mobility? {yes/no:20286} ***cane / ***walker / ***wheelchair  Medications: Current Outpatient Medications  Medication Sig Dispense Refill   acetaminophen (TYLENOL) 325 MG tablet Take 1.5-3 tablets (487.5-975 mg total) by mouth every 6 (six) hours as needed for mild pain (or Fever >/= 101).     albuterol (VENTOLIN HFA) 108 (90 Base) MCG/ACT inhaler Inhale 1-2 puffs  into the lungs every 4 (four) hours as needed for wheezing or shortness of breath.     amiodarone (PACERONE) 200 MG tablet Take 1 po BID thru 9/17, then 1 po daily 30 tablet 1   aspirin EC 81 MG tablet Take 1 tablet (81 mg total) by mouth daily. Swallow whole. 30 tablet 2   Fluticasone-Umeclidin-Vilant (TRELEGY ELLIPTA) 100-62.5-25 MCG/ACT AEPB Inhale 1 puff into the lungs daily. 28 each 0   isosorbide mononitrate (IMDUR) 60 MG 24 hr tablet Take 1 tablet (60 mg total) by mouth daily. 30 tablet 2   levothyroxine (SYNTHROID) 100 MCG tablet Take 1 tablet (100 mcg total) by mouth daily before breakfast. 30 tablet 1   magnesium oxide (MAG-OX) 400 MG tablet Take 0.5 tablets (200 mg total) by mouth at bedtime.     melatonin 3 MG TABS tablet Take 1 tablet (3 mg total) by mouth at bedtime. 60 tablet 0   metoprolol succinate (TOPROL-XL) 25 MG 24 hr tablet TAKE 1 TABLET DAILY 90 tablet 1   mirtazapine (REMERON) 15 MG tablet Take 1 tablet (15 mg total) by mouth at bedtime. 30 tablet 0   Multiple Vitamin (MULTI-VITAMIN) tablet Take 1 tablet by mouth daily.     pantoprazole (PROTONIX) 40 MG tablet Take 1 tablet (40 mg total) by mouth 2 (two) times daily. 60 tablet 1   pravastatin (PRAVACHOL) 40 MG tablet Take 1 tablet (40 mg total) by mouth every evening. 30 tablet 2   psyllium (HYDROCIL/METAMUCIL) 95 % PACK Take 1 packet by mouth daily. 240 each 0   senna (SENOKOT) 8.6 MG TABS tablet Take 1 tablet (8.6 mg total) by mouth at bedtime. 30 tablet 0   No current facility-administered medications for this visit.    Allergies: Allergies  Allergen Reactions   Meloxicam Other (See  Comments)    Caused an injury to the kidneys, per nephrologist   Micardis Hct [Telmisartan-Hctz] Other (See Comments)    Caused an injury to the kidneys, per nephrologist    Past Medical History:  Diagnosis Date   AAA (abdominal aortic aneurysm) (HCC)    Adenocarcinoma of left lung (HCC) 2006   AF (paroxysmal atrial fibrillation)  (HCC)    Aortic stenosis    Arthritis    Asthma    Cancer of breast, female (HCC)    Cancer of lung (HCC)    Dysrhythmia    Hypertension    Hypothyroidism    Invasive ductal carcinoma of left breast (HCC) 1999   Mitral regurgitation    Myocardial infarction (HCC)    Neutropenia (HCC) 06/09/2016   Personal history of radiation therapy    Pulmonary hypertension (HCC)    Past Surgical History:  Procedure Laterality Date   BIOPSY  10/23/2020   Procedure: BIOPSY;  Surgeon: Benancio Deeds, MD;  Location: [facility];  Service: Gastroenterology;;   BREAST BIOPSY     left axillary node dissection   BREAST LUMPECTOMY Left    COLONOSCOPY  03/08/2003   WUJ:[id number] rectal polyps destroyed with the tip of the snare/Polyps at hepatic flexure, splenic flexure at 35 cm/Left-sided diverticula: unable to retrieve path   COLONOSCOPY  09/12/2008   FAO:ZHYQMV rectum and distal sigmoid diminutive polyps/scattered left sided diverticulum. hyperplastic   COLONOSCOPY N/A 03/06/2013   HQI:ONGEXBM polyp-removed as described above; colonic diverticulosis. hyperplastic polyps. next TCS 03/2018   ESOPHAGOGASTRODUODENOSCOPY (EGD) WITH PROPOFOL N/A 10/23/2020   Procedure: ESOPHAGOGASTRODUODENOSCOPY (EGD) WITH PROPOFOL;  Surgeon: Benancio Deeds, MD;  Location: [facility];  Service: Gastroenterology;  Laterality: N/A;   FOOT SURGERY     LUNG REMOVAL, PARTIAL     upper lobe   Family History  Problem Relation Age of Onset   Cancer Mother    Cancer Sister    Colon cancer Neg Hx    Social History   Socioeconomic History   Marital status: Widowed    Spouse name: Not on file   Number of children: Not on file   Years of education: Not on file   Highest education level: Not on file  Occupational History   Not on file  Tobacco Use   Smoking status: Former    Current packs/day: 0.00    Average packs/day: 0.8 packs/day for 35.0 years (26.3 ttl pk-yrs)    Types: Cigarettes    Start date:  27    Quit date: 76    Years since quitting: 31.7   Smokeless tobacco: Never   Tobacco comments:    smoke-free X 30 yeras  Vaping Use   Vaping status: Never Used  Substance and Sexual Activity   Alcohol use: No   Drug use: No   Sexual activity: Not Currently  Other Topics Concern   Not on file  Social History Narrative   Not on file   Social Determinants of Health   Financial Resource Strain: Not on file  Food Insecurity: No Food Insecurity (07/12/2023)   Hunger Vital Sign    Worried About Running Out of Food in the Last Year: Never true    Ran Out of Food in the Last Year: Never true  Transportation Needs: No Transportation Needs (07/12/2023)   PRAPARE - Administrator, Civil Service (Medical): No    Lack of Transportation (Non-Medical): No  Physical Activity: Inactive (12/12/2020)   Exercise Vital Sign  Days of Exercise per Week: 0 days    Minutes of Exercise per Session: 0 min  Stress: Not on file  Social Connections: Not on file  Intimate Partner Violence: Not At Risk (07/12/2023)   Humiliation, Afraid, Rape, and Kick questionnaire    Fear of Current or Ex-Partner: No    Emotionally Abused: No    Physically Abused: No    Sexually Abused: No    SUBJECTIVE  Review of Systems Constitutional: Patient ***denies any unintentional weight loss or change in strength lntegumentary: Patient ***denies any rashes or pruritus Eyes: Patient denies ***dry eyes ENT: Patient ***denies dry mouth Cardiovascular: Patient ***denies chest pain or syncope Respiratory: Patient ***denies shortness of breath Gastrointestinal: Patient ***denies nausea, vomiting, constipation, or diarrhea Musculoskeletal: Patient ***denies muscle cramps or weakness Neurologic: Patient ***denies convulsions or seizures Psychiatric: Patient ***denies memory problems Allergic/Immunologic: Patient ***denies recent allergic reaction(s) Hematologic/Lymphatic: Patient denies bleeding  tendencies Endocrine: Patient ***denies heat/cold intolerance  GU: As per HPI.  OBJECTIVE There were no vitals filed for this visit. There is no height or weight on file to calculate BMI.  Physical Examination  Constitutional: ***No obvious distress; patient is ***non-toxic appearing  Cardiovascular: ***No visible lower extremity edema.  Respiratory: The patient does ***not have audible wheezing/stridor; respirations do ***not appear labored  Gastrointestinal: Abdomen ***non-distended Musculoskeletal: ***Normal ROM of UEs  Skin: ***No obvious rashes/open sores  Neurologic: CN 2-12 grossly ***intact Psychiatric: Answered questions ***appropriately with ***normal affect  Hematologic/Lymphatic/Immunologic: ***No obvious bruises or sites of spontaneous bleeding  UA: ***negative / *** WBC/hpf, *** RBC/hpf, bacteria (***) PVR: *** ml  ASSESSMENT No diagnosis found. ***  Will plan for follow up in *** months or sooner if needed. Pt verbalized understanding and agreement. All questions were answered.  PLAN Advised the following: *** ***No follow-ups on file.  No orders of the defined types were placed in this encounter.   It has been explained that the patient is to follow regularly with their PCP in addition to all other providers involved in their care and to follow instructions provided by these respective offices. Patient advised to contact urology clinic if any urologic-pertaining questions, concerns, new symptoms or problems arise in the interim period.  There are no Patient Instructions on file for this visit.  Electronically signed by:  Donnita Falls, MSN, FNP-C, CUNP 07/28/2023 10:27 AM

## 2023-07-29 DIAGNOSIS — D61818 Other pancytopenia: Secondary | ICD-10-CM | POA: Diagnosis not present

## 2023-07-29 DIAGNOSIS — G4700 Insomnia, unspecified: Secondary | ICD-10-CM | POA: Diagnosis not present

## 2023-07-29 DIAGNOSIS — I2510 Atherosclerotic heart disease of native coronary artery without angina pectoris: Secondary | ICD-10-CM | POA: Diagnosis not present

## 2023-07-29 DIAGNOSIS — Z7951 Long term (current) use of inhaled steroids: Secondary | ICD-10-CM | POA: Diagnosis not present

## 2023-07-29 DIAGNOSIS — J9622 Acute and chronic respiratory failure with hypercapnia: Secondary | ICD-10-CM | POA: Diagnosis not present

## 2023-07-29 DIAGNOSIS — D696 Thrombocytopenia, unspecified: Secondary | ICD-10-CM | POA: Diagnosis not present

## 2023-07-29 DIAGNOSIS — I272 Pulmonary hypertension, unspecified: Secondary | ICD-10-CM | POA: Diagnosis not present

## 2023-07-29 DIAGNOSIS — I080 Rheumatic disorders of both mitral and aortic valves: Secondary | ICD-10-CM | POA: Diagnosis not present

## 2023-07-29 DIAGNOSIS — N1832 Chronic kidney disease, stage 3b: Secondary | ICD-10-CM | POA: Diagnosis not present

## 2023-07-29 DIAGNOSIS — E785 Hyperlipidemia, unspecified: Secondary | ICD-10-CM | POA: Diagnosis not present

## 2023-07-29 DIAGNOSIS — I480 Paroxysmal atrial fibrillation: Secondary | ICD-10-CM | POA: Diagnosis not present

## 2023-07-29 DIAGNOSIS — I714 Abdominal aortic aneurysm, without rupture, unspecified: Secondary | ICD-10-CM | POA: Diagnosis not present

## 2023-07-29 DIAGNOSIS — D631 Anemia in chronic kidney disease: Secondary | ICD-10-CM | POA: Diagnosis not present

## 2023-07-29 DIAGNOSIS — J9621 Acute and chronic respiratory failure with hypoxia: Secondary | ICD-10-CM | POA: Diagnosis not present

## 2023-07-29 DIAGNOSIS — I252 Old myocardial infarction: Secondary | ICD-10-CM | POA: Diagnosis not present

## 2023-07-29 DIAGNOSIS — Z9981 Dependence on supplemental oxygen: Secondary | ICD-10-CM | POA: Diagnosis not present

## 2023-07-29 DIAGNOSIS — E039 Hypothyroidism, unspecified: Secondary | ICD-10-CM | POA: Diagnosis not present

## 2023-07-29 DIAGNOSIS — C9310 Chronic myelomonocytic leukemia not having achieved remission: Secondary | ICD-10-CM | POA: Diagnosis not present

## 2023-07-29 DIAGNOSIS — I5031 Acute diastolic (congestive) heart failure: Secondary | ICD-10-CM | POA: Diagnosis not present

## 2023-07-29 DIAGNOSIS — J441 Chronic obstructive pulmonary disease with (acute) exacerbation: Secondary | ICD-10-CM | POA: Diagnosis not present

## 2023-07-29 DIAGNOSIS — I700 Atherosclerosis of aorta: Secondary | ICD-10-CM | POA: Diagnosis not present

## 2023-07-29 DIAGNOSIS — D630 Anemia in neoplastic disease: Secondary | ICD-10-CM | POA: Diagnosis not present

## 2023-07-29 DIAGNOSIS — I130 Hypertensive heart and chronic kidney disease with heart failure and stage 1 through stage 4 chronic kidney disease, or unspecified chronic kidney disease: Secondary | ICD-10-CM | POA: Diagnosis not present

## 2023-07-29 DIAGNOSIS — D469 Myelodysplastic syndrome, unspecified: Secondary | ICD-10-CM | POA: Diagnosis not present

## 2023-07-29 DIAGNOSIS — Z7952 Long term (current) use of systemic steroids: Secondary | ICD-10-CM | POA: Diagnosis not present

## 2023-07-31 ENCOUNTER — Emergency Department (HOSPITAL_COMMUNITY): Payer: Medicare Other

## 2023-07-31 ENCOUNTER — Inpatient Hospital Stay (HOSPITAL_COMMUNITY)
Admission: EM | Admit: 2023-07-31 | Discharge: 2023-08-16 | DRG: 640 | Disposition: A | Discharge status: Rehabilitation Facility | Payer: Medicare Other | Attending: Family Medicine | Admitting: Family Medicine

## 2023-07-31 ENCOUNTER — Other Ambulatory Visit: Payer: Self-pay

## 2023-07-31 DIAGNOSIS — J9621 Acute and chronic respiratory failure with hypoxia: Secondary | ICD-10-CM | POA: Diagnosis not present

## 2023-07-31 DIAGNOSIS — I2510 Atherosclerotic heart disease of native coronary artery without angina pectoris: Secondary | ICD-10-CM | POA: Diagnosis present

## 2023-07-31 DIAGNOSIS — R0989 Other specified symptoms and signs involving the circulatory and respiratory systems: Secondary | ICD-10-CM | POA: Diagnosis not present

## 2023-07-31 DIAGNOSIS — H9190 Unspecified hearing loss, unspecified ear: Secondary | ICD-10-CM | POA: Diagnosis present

## 2023-07-31 DIAGNOSIS — I130 Hypertensive heart and chronic kidney disease with heart failure and stage 1 through stage 4 chronic kidney disease, or unspecified chronic kidney disease: Secondary | ICD-10-CM | POA: Diagnosis not present

## 2023-07-31 DIAGNOSIS — L89152 Pressure ulcer of sacral region, stage 2: Secondary | ICD-10-CM | POA: Diagnosis present

## 2023-07-31 DIAGNOSIS — Z1152 Encounter for screening for COVID-19: Secondary | ICD-10-CM

## 2023-07-31 DIAGNOSIS — Z9981 Dependence on supplemental oxygen: Secondary | ICD-10-CM

## 2023-07-31 DIAGNOSIS — D469 Myelodysplastic syndrome, unspecified: Secondary | ICD-10-CM | POA: Diagnosis not present

## 2023-07-31 DIAGNOSIS — I517 Cardiomegaly: Secondary | ICD-10-CM | POA: Diagnosis not present

## 2023-07-31 DIAGNOSIS — Z85118 Personal history of other malignant neoplasm of bronchus and lung: Secondary | ICD-10-CM

## 2023-07-31 DIAGNOSIS — Z7982 Long term (current) use of aspirin: Secondary | ICD-10-CM

## 2023-07-31 DIAGNOSIS — Z87891 Personal history of nicotine dependence: Secondary | ICD-10-CM

## 2023-07-31 DIAGNOSIS — C50912 Malignant neoplasm of unspecified site of left female breast: Secondary | ICD-10-CM | POA: Diagnosis present

## 2023-07-31 DIAGNOSIS — E875 Hyperkalemia: Secondary | ICD-10-CM | POA: Diagnosis present

## 2023-07-31 DIAGNOSIS — R627 Adult failure to thrive: Secondary | ICD-10-CM | POA: Diagnosis present

## 2023-07-31 DIAGNOSIS — F0390 Unspecified dementia without behavioral disturbance: Secondary | ICD-10-CM | POA: Diagnosis present

## 2023-07-31 DIAGNOSIS — I714 Abdominal aortic aneurysm, without rupture, unspecified: Secondary | ICD-10-CM | POA: Diagnosis not present

## 2023-07-31 DIAGNOSIS — C3492 Malignant neoplasm of unspecified part of left bronchus or lung: Secondary | ICD-10-CM | POA: Diagnosis present

## 2023-07-31 DIAGNOSIS — E440 Moderate protein-calorie malnutrition: Secondary | ICD-10-CM | POA: Diagnosis present

## 2023-07-31 DIAGNOSIS — I509 Heart failure, unspecified: Secondary | ICD-10-CM | POA: Diagnosis not present

## 2023-07-31 DIAGNOSIS — Z888 Allergy status to other drugs, medicaments and biological substances status: Secondary | ICD-10-CM

## 2023-07-31 DIAGNOSIS — E871 Hypo-osmolality and hyponatremia: Principal | ICD-10-CM | POA: Diagnosis present

## 2023-07-31 DIAGNOSIS — R110 Nausea: Secondary | ICD-10-CM | POA: Diagnosis present

## 2023-07-31 DIAGNOSIS — I480 Paroxysmal atrial fibrillation: Secondary | ICD-10-CM | POA: Diagnosis not present

## 2023-07-31 DIAGNOSIS — C9310 Chronic myelomonocytic leukemia not having achieved remission: Secondary | ICD-10-CM | POA: Diagnosis not present

## 2023-07-31 DIAGNOSIS — L8990 Pressure ulcer of unspecified site, unspecified stage: Secondary | ICD-10-CM | POA: Insufficient documentation

## 2023-07-31 DIAGNOSIS — R062 Wheezing: Secondary | ICD-10-CM | POA: Diagnosis not present

## 2023-07-31 DIAGNOSIS — J9622 Acute and chronic respiratory failure with hypercapnia: Secondary | ICD-10-CM | POA: Diagnosis not present

## 2023-07-31 DIAGNOSIS — J9611 Chronic respiratory failure with hypoxia: Secondary | ICD-10-CM | POA: Diagnosis not present

## 2023-07-31 DIAGNOSIS — D6959 Other secondary thrombocytopenia: Secondary | ICD-10-CM | POA: Diagnosis present

## 2023-07-31 DIAGNOSIS — Z6828 Body mass index (BMI) 28.0-28.9, adult: Secondary | ICD-10-CM

## 2023-07-31 DIAGNOSIS — E872 Acidosis, unspecified: Secondary | ICD-10-CM | POA: Diagnosis present

## 2023-07-31 DIAGNOSIS — R911 Solitary pulmonary nodule: Secondary | ICD-10-CM | POA: Diagnosis not present

## 2023-07-31 DIAGNOSIS — Z66 Do not resuscitate: Secondary | ICD-10-CM | POA: Diagnosis present

## 2023-07-31 DIAGNOSIS — D649 Anemia, unspecified: Secondary | ICD-10-CM | POA: Diagnosis not present

## 2023-07-31 DIAGNOSIS — R069 Unspecified abnormalities of breathing: Secondary | ICD-10-CM | POA: Diagnosis not present

## 2023-07-31 DIAGNOSIS — R0902 Hypoxemia: Secondary | ICD-10-CM | POA: Diagnosis not present

## 2023-07-31 DIAGNOSIS — Z515 Encounter for palliative care: Secondary | ICD-10-CM

## 2023-07-31 DIAGNOSIS — J690 Pneumonitis due to inhalation of food and vomit: Secondary | ICD-10-CM | POA: Diagnosis present

## 2023-07-31 DIAGNOSIS — I2722 Pulmonary hypertension due to left heart disease: Secondary | ICD-10-CM | POA: Diagnosis present

## 2023-07-31 DIAGNOSIS — I5032 Chronic diastolic (congestive) heart failure: Secondary | ICD-10-CM | POA: Diagnosis not present

## 2023-07-31 DIAGNOSIS — J811 Chronic pulmonary edema: Secondary | ICD-10-CM | POA: Diagnosis not present

## 2023-07-31 DIAGNOSIS — J90 Pleural effusion, not elsewhere classified: Secondary | ICD-10-CM | POA: Diagnosis not present

## 2023-07-31 DIAGNOSIS — D630 Anemia in neoplastic disease: Secondary | ICD-10-CM | POA: Diagnosis present

## 2023-07-31 DIAGNOSIS — D539 Nutritional anemia, unspecified: Secondary | ICD-10-CM | POA: Diagnosis present

## 2023-07-31 DIAGNOSIS — Z853 Personal history of malignant neoplasm of breast: Secondary | ICD-10-CM

## 2023-07-31 DIAGNOSIS — E039 Hypothyroidism, unspecified: Secondary | ICD-10-CM | POA: Diagnosis present

## 2023-07-31 DIAGNOSIS — R7402 Elevation of levels of lactic acid dehydrogenase (LDH): Secondary | ICD-10-CM | POA: Diagnosis present

## 2023-07-31 DIAGNOSIS — I5033 Acute on chronic diastolic (congestive) heart failure: Secondary | ICD-10-CM | POA: Diagnosis not present

## 2023-07-31 DIAGNOSIS — I214 Non-ST elevation (NSTEMI) myocardial infarction: Secondary | ICD-10-CM | POA: Diagnosis present

## 2023-07-31 DIAGNOSIS — D709 Neutropenia, unspecified: Secondary | ICD-10-CM | POA: Diagnosis not present

## 2023-07-31 DIAGNOSIS — G9341 Metabolic encephalopathy: Secondary | ICD-10-CM | POA: Diagnosis not present

## 2023-07-31 DIAGNOSIS — C3490 Malignant neoplasm of unspecified part of unspecified bronchus or lung: Secondary | ICD-10-CM | POA: Diagnosis not present

## 2023-07-31 DIAGNOSIS — R319 Hematuria, unspecified: Secondary | ICD-10-CM | POA: Diagnosis present

## 2023-07-31 DIAGNOSIS — Z923 Personal history of irradiation: Secondary | ICD-10-CM

## 2023-07-31 DIAGNOSIS — Z23 Encounter for immunization: Secondary | ICD-10-CM

## 2023-07-31 DIAGNOSIS — N183 Chronic kidney disease, stage 3 unspecified: Secondary | ICD-10-CM | POA: Diagnosis present

## 2023-07-31 DIAGNOSIS — Z79899 Other long term (current) drug therapy: Secondary | ICD-10-CM

## 2023-07-31 DIAGNOSIS — R0602 Shortness of breath: Secondary | ICD-10-CM | POA: Diagnosis not present

## 2023-07-31 DIAGNOSIS — R112 Nausea with vomiting, unspecified: Secondary | ICD-10-CM | POA: Diagnosis not present

## 2023-07-31 DIAGNOSIS — R918 Other nonspecific abnormal finding of lung field: Secondary | ICD-10-CM | POA: Diagnosis not present

## 2023-07-31 DIAGNOSIS — I700 Atherosclerosis of aorta: Secondary | ICD-10-CM | POA: Diagnosis not present

## 2023-07-31 DIAGNOSIS — N179 Acute kidney failure, unspecified: Secondary | ICD-10-CM | POA: Diagnosis not present

## 2023-07-31 DIAGNOSIS — Z8673 Personal history of transient ischemic attack (TIA), and cerebral infarction without residual deficits: Secondary | ICD-10-CM

## 2023-07-31 DIAGNOSIS — M109 Gout, unspecified: Secondary | ICD-10-CM | POA: Diagnosis present

## 2023-07-31 DIAGNOSIS — J439 Emphysema, unspecified: Secondary | ICD-10-CM | POA: Diagnosis present

## 2023-07-31 DIAGNOSIS — D61818 Other pancytopenia: Secondary | ICD-10-CM | POA: Diagnosis present

## 2023-07-31 DIAGNOSIS — N1831 Chronic kidney disease, stage 3a: Secondary | ICD-10-CM | POA: Diagnosis not present

## 2023-07-31 DIAGNOSIS — M6281 Muscle weakness (generalized): Secondary | ICD-10-CM | POA: Diagnosis not present

## 2023-07-31 DIAGNOSIS — Z79890 Hormone replacement therapy (postmenopausal): Secondary | ICD-10-CM

## 2023-07-31 DIAGNOSIS — G928 Other toxic encephalopathy: Secondary | ICD-10-CM | POA: Diagnosis present

## 2023-07-31 DIAGNOSIS — J4489 Other specified chronic obstructive pulmonary disease: Secondary | ICD-10-CM | POA: Diagnosis present

## 2023-07-31 DIAGNOSIS — Z7189 Other specified counseling: Secondary | ICD-10-CM | POA: Diagnosis not present

## 2023-07-31 DIAGNOSIS — E861 Hypovolemia: Secondary | ICD-10-CM | POA: Diagnosis present

## 2023-07-31 DIAGNOSIS — R109 Unspecified abdominal pain: Secondary | ICD-10-CM | POA: Diagnosis not present

## 2023-07-31 DIAGNOSIS — Z902 Acquired absence of lung [part of]: Secondary | ICD-10-CM

## 2023-07-31 DIAGNOSIS — R1084 Generalized abdominal pain: Secondary | ICD-10-CM | POA: Diagnosis not present

## 2023-07-31 DIAGNOSIS — J984 Other disorders of lung: Secondary | ICD-10-CM | POA: Diagnosis not present

## 2023-07-31 DIAGNOSIS — J189 Pneumonia, unspecified organism: Secondary | ICD-10-CM | POA: Diagnosis not present

## 2023-07-31 DIAGNOSIS — I350 Nonrheumatic aortic (valve) stenosis: Secondary | ICD-10-CM

## 2023-07-31 DIAGNOSIS — I10 Essential (primary) hypertension: Secondary | ICD-10-CM | POA: Diagnosis present

## 2023-07-31 DIAGNOSIS — I5030 Unspecified diastolic (congestive) heart failure: Secondary | ICD-10-CM | POA: Diagnosis not present

## 2023-07-31 DIAGNOSIS — G7281 Critical illness myopathy: Secondary | ICD-10-CM | POA: Diagnosis not present

## 2023-07-31 LAB — COMPREHENSIVE METABOLIC PANEL
ALT: 12 U/L (ref 0–44)
AST: 32 U/L (ref 15–41)
Albumin: 3.3 g/dL — ABNORMAL LOW (ref 3.5–5.0)
Alkaline Phosphatase: 45 U/L (ref 38–126)
Anion gap: 12 (ref 5–15)
BUN: 11 mg/dL (ref 8–23)
CO2: 23 mmol/L (ref 22–32)
Calcium: 8.5 mg/dL — ABNORMAL LOW (ref 8.9–10.3)
Chloride: 83 mmol/L — ABNORMAL LOW (ref 98–111)
Creatinine, Ser: 0.96 mg/dL (ref 0.44–1.00)
GFR, Estimated: 57 mL/min — ABNORMAL LOW (ref >60)
Glucose, Bld: 115 mg/dL — ABNORMAL HIGH (ref 70–99)
Potassium: 5.5 mmol/L — ABNORMAL HIGH (ref 3.5–5.1)
Sodium: 118 mmol/L — CL (ref 135–145)
Total Bilirubin: 0.5 mg/dL (ref 0.3–1.2)
Total Protein: 8.7 g/dL — ABNORMAL HIGH (ref 6.5–8.1)

## 2023-07-31 LAB — CBC WITH DIFFERENTIAL/PLATELET
Abs Immature Granulocytes: 0.2 10*3/uL — ABNORMAL HIGH (ref 0.00–0.07)
Basophils Absolute: 0 10*3/uL (ref 0.0–0.1)
Basophils Relative: 0 %
Eosinophils Absolute: 0 10*3/uL (ref 0.0–0.5)
Eosinophils Relative: 0 %
HCT: 25.6 % — ABNORMAL LOW (ref 36.0–46.0)
Hemoglobin: 7.8 g/dL — ABNORMAL LOW (ref 12.0–15.0)
Immature Granulocytes: 7 %
Lymphocytes Relative: 50 %
Lymphs Abs: 1.4 10*3/uL (ref 0.7–4.0)
MCH: 30.7 pg (ref 26.0–34.0)
MCHC: 30.5 g/dL (ref 30.0–36.0)
MCV: 100.8 fL — ABNORMAL HIGH (ref 80.0–100.0)
Monocytes Absolute: 1 10*3/uL (ref 0.1–1.0)
Monocytes Relative: 36 %
Neutro Abs: 0.2 10*3/uL — CL (ref 1.7–7.7)
Neutrophils Relative %: 7 %
Platelets: 118 10*3/uL — ABNORMAL LOW (ref 150–400)
RBC: 2.54 MIL/uL — ABNORMAL LOW (ref 3.87–5.11)
RDW: 18.9 % — ABNORMAL HIGH (ref 11.5–15.5)
WBC: 2.8 10*3/uL — ABNORMAL LOW (ref 4.0–10.5)
nRBC: 19.3 % — ABNORMAL HIGH (ref 0.0–0.2)

## 2023-07-31 LAB — PROTIME-INR
INR: 1.4 — ABNORMAL HIGH (ref 0.8–1.2)
Prothrombin Time: 16.9 s — ABNORMAL HIGH (ref 11.4–15.2)

## 2023-07-31 LAB — LIPASE, BLOOD: Lipase: 49 U/L (ref 11–51)

## 2023-07-31 LAB — TROPONIN I (HIGH SENSITIVITY): Troponin I (High Sensitivity): 14 ng/L (ref <18)

## 2023-07-31 MED ORDER — IPRATROPIUM-ALBUTEROL 0.5-2.5 (3) MG/3ML IN SOLN
3.0000 mL | Freq: Once | RESPIRATORY_TRACT | Status: AC
  Administered 2023-08-01: 3 mL via RESPIRATORY_TRACT
  Filled 2023-07-31: qty 3

## 2023-07-31 NOTE — ED Triage Notes (Signed)
Patient's son reported shortness of breath and chest tightness  this evening with pain across abdomen and emesis / bilateral knee pain yesterday . Fatigue and poor oral intake .

## 2023-08-01 ENCOUNTER — Emergency Department (HOSPITAL_COMMUNITY): Payer: Medicare Other

## 2023-08-01 ENCOUNTER — Encounter (HOSPITAL_COMMUNITY): Payer: Self-pay | Admitting: Emergency Medicine

## 2023-08-01 DIAGNOSIS — G9341 Metabolic encephalopathy: Secondary | ICD-10-CM | POA: Diagnosis not present

## 2023-08-01 DIAGNOSIS — C9310 Chronic myelomonocytic leukemia not having achieved remission: Secondary | ICD-10-CM

## 2023-08-01 DIAGNOSIS — M6281 Muscle weakness (generalized): Secondary | ICD-10-CM

## 2023-08-01 DIAGNOSIS — R0902 Hypoxemia: Secondary | ICD-10-CM | POA: Diagnosis not present

## 2023-08-01 DIAGNOSIS — J690 Pneumonitis due to inhalation of food and vomit: Secondary | ICD-10-CM | POA: Diagnosis present

## 2023-08-01 DIAGNOSIS — E440 Moderate protein-calorie malnutrition: Secondary | ICD-10-CM | POA: Diagnosis present

## 2023-08-01 DIAGNOSIS — Z23 Encounter for immunization: Secondary | ICD-10-CM | POA: Diagnosis not present

## 2023-08-01 DIAGNOSIS — I714 Abdominal aortic aneurysm, without rupture, unspecified: Secondary | ICD-10-CM | POA: Diagnosis present

## 2023-08-01 DIAGNOSIS — R0989 Other specified symptoms and signs involving the circulatory and respiratory systems: Secondary | ICD-10-CM | POA: Diagnosis not present

## 2023-08-01 DIAGNOSIS — I350 Nonrheumatic aortic (valve) stenosis: Secondary | ICD-10-CM

## 2023-08-01 DIAGNOSIS — I4811 Longstanding persistent atrial fibrillation: Secondary | ICD-10-CM | POA: Diagnosis not present

## 2023-08-01 DIAGNOSIS — R0602 Shortness of breath: Secondary | ICD-10-CM | POA: Diagnosis not present

## 2023-08-01 DIAGNOSIS — J984 Other disorders of lung: Secondary | ICD-10-CM | POA: Diagnosis not present

## 2023-08-01 DIAGNOSIS — Z7189 Other specified counseling: Secondary | ICD-10-CM

## 2023-08-01 DIAGNOSIS — C3492 Malignant neoplasm of unspecified part of left bronchus or lung: Secondary | ICD-10-CM

## 2023-08-01 DIAGNOSIS — J90 Pleural effusion, not elsewhere classified: Secondary | ICD-10-CM | POA: Diagnosis not present

## 2023-08-01 DIAGNOSIS — R918 Other nonspecific abnormal finding of lung field: Secondary | ICD-10-CM | POA: Diagnosis not present

## 2023-08-01 DIAGNOSIS — I10 Essential (primary) hypertension: Secondary | ICD-10-CM | POA: Diagnosis not present

## 2023-08-01 DIAGNOSIS — E875 Hyperkalemia: Secondary | ICD-10-CM | POA: Diagnosis not present

## 2023-08-01 DIAGNOSIS — K219 Gastro-esophageal reflux disease without esophagitis: Secondary | ICD-10-CM | POA: Diagnosis not present

## 2023-08-01 DIAGNOSIS — D539 Nutritional anemia, unspecified: Secondary | ICD-10-CM | POA: Diagnosis present

## 2023-08-01 DIAGNOSIS — I5030 Unspecified diastolic (congestive) heart failure: Secondary | ICD-10-CM | POA: Diagnosis not present

## 2023-08-01 DIAGNOSIS — J811 Chronic pulmonary edema: Secondary | ICD-10-CM | POA: Diagnosis not present

## 2023-08-01 DIAGNOSIS — E039 Hypothyroidism, unspecified: Secondary | ICD-10-CM | POA: Diagnosis present

## 2023-08-01 DIAGNOSIS — R112 Nausea with vomiting, unspecified: Secondary | ICD-10-CM

## 2023-08-01 DIAGNOSIS — R2689 Other abnormalities of gait and mobility: Secondary | ICD-10-CM | POA: Diagnosis not present

## 2023-08-01 DIAGNOSIS — I700 Atherosclerosis of aorta: Secondary | ICD-10-CM | POA: Diagnosis not present

## 2023-08-01 DIAGNOSIS — I5032 Chronic diastolic (congestive) heart failure: Secondary | ICD-10-CM | POA: Diagnosis not present

## 2023-08-01 DIAGNOSIS — Z515 Encounter for palliative care: Secondary | ICD-10-CM

## 2023-08-01 DIAGNOSIS — J9622 Acute and chronic respiratory failure with hypercapnia: Secondary | ICD-10-CM | POA: Diagnosis not present

## 2023-08-01 DIAGNOSIS — N179 Acute kidney failure, unspecified: Secondary | ICD-10-CM | POA: Diagnosis not present

## 2023-08-01 DIAGNOSIS — I5033 Acute on chronic diastolic (congestive) heart failure: Secondary | ICD-10-CM | POA: Diagnosis not present

## 2023-08-01 DIAGNOSIS — I130 Hypertensive heart and chronic kidney disease with heart failure and stage 1 through stage 4 chronic kidney disease, or unspecified chronic kidney disease: Secondary | ICD-10-CM | POA: Diagnosis present

## 2023-08-01 DIAGNOSIS — I480 Paroxysmal atrial fibrillation: Secondary | ICD-10-CM | POA: Diagnosis not present

## 2023-08-01 DIAGNOSIS — J9621 Acute and chronic respiratory failure with hypoxia: Secondary | ICD-10-CM | POA: Diagnosis not present

## 2023-08-01 DIAGNOSIS — L89152 Pressure ulcer of sacral region, stage 2: Secondary | ICD-10-CM | POA: Diagnosis present

## 2023-08-01 DIAGNOSIS — I214 Non-ST elevation (NSTEMI) myocardial infarction: Secondary | ICD-10-CM | POA: Diagnosis present

## 2023-08-01 DIAGNOSIS — D709 Neutropenia, unspecified: Secondary | ICD-10-CM | POA: Diagnosis not present

## 2023-08-01 DIAGNOSIS — I517 Cardiomegaly: Secondary | ICD-10-CM | POA: Diagnosis not present

## 2023-08-01 DIAGNOSIS — F0390 Unspecified dementia without behavioral disturbance: Secondary | ICD-10-CM | POA: Diagnosis present

## 2023-08-01 DIAGNOSIS — R911 Solitary pulmonary nodule: Secondary | ICD-10-CM | POA: Diagnosis not present

## 2023-08-01 DIAGNOSIS — D649 Anemia, unspecified: Secondary | ICD-10-CM | POA: Diagnosis not present

## 2023-08-01 DIAGNOSIS — E871 Hypo-osmolality and hyponatremia: Secondary | ICD-10-CM | POA: Diagnosis present

## 2023-08-01 DIAGNOSIS — C3490 Malignant neoplasm of unspecified part of unspecified bronchus or lung: Secondary | ICD-10-CM | POA: Diagnosis not present

## 2023-08-01 DIAGNOSIS — J4489 Other specified chronic obstructive pulmonary disease: Secondary | ICD-10-CM | POA: Diagnosis present

## 2023-08-01 DIAGNOSIS — N1831 Chronic kidney disease, stage 3a: Secondary | ICD-10-CM | POA: Diagnosis present

## 2023-08-01 DIAGNOSIS — Z1152 Encounter for screening for COVID-19: Secondary | ICD-10-CM | POA: Diagnosis not present

## 2023-08-01 DIAGNOSIS — I509 Heart failure, unspecified: Secondary | ICD-10-CM | POA: Diagnosis not present

## 2023-08-01 DIAGNOSIS — G7281 Critical illness myopathy: Secondary | ICD-10-CM | POA: Diagnosis not present

## 2023-08-01 DIAGNOSIS — D469 Myelodysplastic syndrome, unspecified: Secondary | ICD-10-CM | POA: Diagnosis not present

## 2023-08-01 DIAGNOSIS — Z66 Do not resuscitate: Secondary | ICD-10-CM | POA: Diagnosis present

## 2023-08-01 DIAGNOSIS — G928 Other toxic encephalopathy: Secondary | ICD-10-CM | POA: Diagnosis present

## 2023-08-01 DIAGNOSIS — R627 Adult failure to thrive: Secondary | ICD-10-CM | POA: Diagnosis not present

## 2023-08-01 DIAGNOSIS — D61818 Other pancytopenia: Secondary | ICD-10-CM

## 2023-08-01 DIAGNOSIS — J189 Pneumonia, unspecified organism: Secondary | ICD-10-CM | POA: Diagnosis not present

## 2023-08-01 DIAGNOSIS — J9611 Chronic respiratory failure with hypoxia: Secondary | ICD-10-CM | POA: Diagnosis not present

## 2023-08-01 DIAGNOSIS — I2722 Pulmonary hypertension due to left heart disease: Secondary | ICD-10-CM | POA: Diagnosis present

## 2023-08-01 DIAGNOSIS — R109 Unspecified abdominal pain: Secondary | ICD-10-CM | POA: Diagnosis not present

## 2023-08-01 LAB — BASIC METABOLIC PANEL
Anion gap: 10 (ref 5–15)
Anion gap: 14 (ref 5–15)
Anion gap: 6 (ref 5–15)
BUN: 10 mg/dL (ref 8–23)
BUN: 9 mg/dL (ref 8–23)
BUN: 8 mg/dL (ref 8–23)
CO2: 21 mmol/L — ABNORMAL LOW (ref 22–32)
CO2: 23 mmol/L (ref 22–32)
CO2: 25 mmol/L (ref 22–32)
Calcium: 8.4 mg/dL — ABNORMAL LOW (ref 8.9–10.3)
Calcium: 8.5 mg/dL — ABNORMAL LOW (ref 8.9–10.3)
Calcium: 8.2 mg/dL — ABNORMAL LOW (ref 8.9–10.3)
Chloride: 87 mmol/L — ABNORMAL LOW (ref 98–111)
Chloride: 84 mmol/L — ABNORMAL LOW (ref 98–111)
Chloride: 90 mmol/L — ABNORMAL LOW (ref 98–111)
Creatinine, Ser: 0.99 mg/dL (ref 0.44–1.00)
Creatinine, Ser: 0.95 mg/dL (ref 0.44–1.00)
Creatinine, Ser: 0.92 mg/dL (ref 0.44–1.00)
GFR, Estimated: 55 mL/min — ABNORMAL LOW (ref >60)
GFR, Estimated: 58 mL/min — ABNORMAL LOW (ref >60)
GFR, Estimated: >60 mL/min (ref >60)
Glucose, Bld: 81 mg/dL (ref 70–99)
Glucose, Bld: 83 mg/dL (ref 70–99)
Glucose, Bld: 83 mg/dL (ref 70–99)
Potassium: 5.4 mmol/L — ABNORMAL HIGH (ref 3.5–5.1)
Potassium: 5.2 mmol/L — ABNORMAL HIGH (ref 3.5–5.1)
Potassium: 4.9 mmol/L (ref 3.5–5.1)
Sodium: 118 mmol/L — CL (ref 135–145)
Sodium: 121 mmol/L — ABNORMAL LOW (ref 135–145)
Sodium: 121 mmol/L — ABNORMAL LOW (ref 135–145)

## 2023-08-01 LAB — RESP PANEL BY RT-PCR (RSV, FLU A&B, COVID)  RVPGX2
Influenza A by PCR: NEGATIVE
Influenza B by PCR: NEGATIVE
Resp Syncytial Virus by PCR: NEGATIVE
SARS Coronavirus 2 by RT PCR: NEGATIVE

## 2023-08-01 LAB — CBC
HCT: 25 % — ABNORMAL LOW (ref 36.0–46.0)
Hemoglobin: 7.6 g/dL — ABNORMAL LOW (ref 12.0–15.0)
MCH: 30.2 pg (ref 26.0–34.0)
MCHC: 30.4 g/dL (ref 30.0–36.0)
MCV: 99.2 fL (ref 80.0–100.0)
Platelets: 108 10*3/uL — ABNORMAL LOW (ref 150–400)
RBC: 2.52 MIL/uL — ABNORMAL LOW (ref 3.87–5.11)
RDW: 19 % — ABNORMAL HIGH (ref 11.5–15.5)
WBC: 3.1 10*3/uL — ABNORMAL LOW (ref 4.0–10.5)
nRBC: 15.4 % — ABNORMAL HIGH (ref 0.0–0.2)

## 2023-08-01 LAB — URINALYSIS, ROUTINE W REFLEX MICROSCOPIC
Bilirubin Urine: NEGATIVE
Glucose, UA: NEGATIVE mg/dL
Hgb urine dipstick: NEGATIVE
Ketones, ur: NEGATIVE mg/dL
Leukocytes,Ua: NEGATIVE
Nitrite: NEGATIVE
Protein, ur: NEGATIVE mg/dL
Specific Gravity, Urine: 1.011 (ref 1.005–1.030)
pH: 7 (ref 5.0–8.0)

## 2023-08-01 LAB — CREATININE, SERUM
Creatinine, Ser: 0.95 mg/dL (ref 0.44–1.00)
GFR, Estimated: 58 mL/min — ABNORMAL LOW (ref >60)

## 2023-08-01 LAB — LACTATE DEHYDROGENASE: LDH: 392 U/L — ABNORMAL HIGH (ref 98–192)

## 2023-08-01 LAB — TSH: TSH: 4.13 u[IU]/mL (ref 0.350–4.500)

## 2023-08-01 LAB — PROTIME-INR
INR: 1.3 — ABNORMAL HIGH (ref 0.8–1.2)
Prothrombin Time: 16.8 s — ABNORMAL HIGH (ref 11.4–15.2)

## 2023-08-01 LAB — SODIUM, URINE, RANDOM: Sodium, Ur: 29 mmol/L

## 2023-08-01 LAB — BRAIN NATRIURETIC PEPTIDE: B Natriuretic Peptide: 1637.4 pg/mL — ABNORMAL HIGH (ref 0.0–100.0)

## 2023-08-01 LAB — OSMOLALITY: Osmolality: 271 mosm/kg — ABNORMAL LOW (ref 275–295)

## 2023-08-01 LAB — TECHNOLOGIST SMEAR REVIEW: Plt Morphology: DECREASED

## 2023-08-01 LAB — URIC ACID: Uric Acid, Serum: 6.1 mg/dL (ref 2.5–7.1)

## 2023-08-01 LAB — OSMOLALITY, URINE: Osmolality, Ur: 170 mosm/kg — ABNORMAL LOW (ref 300–900)

## 2023-08-01 LAB — TROPONIN I (HIGH SENSITIVITY): Troponin I (High Sensitivity): 16 ng/L (ref <18)

## 2023-08-01 MED ORDER — SODIUM CHLORIDE 0.9 % IV BOLUS: 250.0000 mL | Freq: Once | INTRAVENOUS | Status: DC

## 2023-08-01 MED ORDER — ACETAMINOPHEN 325 MG PO TABS
650.0000 mg | ORAL_TABLET | Freq: Four times a day (QID) | ORAL | Status: DC | PRN
  Administered 2023-08-07 – 2023-08-13 (×4): 650 mg via ORAL
  Filled 2023-08-01 (×5): qty 2

## 2023-08-01 MED ORDER — SODIUM ZIRCONIUM CYCLOSILICATE 10 G PO PACK
10.0000 g | PACK | Freq: Three times a day (TID) | ORAL | Status: DC
  Administered 2023-08-01: 10 g via ORAL
  Filled 2023-08-01: qty 1

## 2023-08-01 MED ORDER — ENOXAPARIN SODIUM 40 MG/0.4ML IJ SOSY
40.0000 mg | PREFILLED_SYRINGE | INTRAMUSCULAR | Status: DC
  Administered 2023-08-01 – 2023-08-02 (×2): 40 mg via SUBCUTANEOUS
  Filled 2023-08-01 (×2): qty 0.4

## 2023-08-01 MED ORDER — ADULT MULTIVITAMIN W/MINERALS CH
1.0000 | ORAL_TABLET | Freq: Every day | ORAL | Status: DC
  Administered 2023-08-01 – 2023-08-16 (×16): 1 via ORAL
  Filled 2023-08-01 (×17): qty 1

## 2023-08-01 MED ORDER — ENOXAPARIN SODIUM 40 MG/0.4ML IJ SOSY: 40.0000 mg | PREFILLED_SYRINGE | INTRAMUSCULAR | Status: DC

## 2023-08-01 MED ORDER — UMECLIDINIUM BROMIDE 62.5 MCG/ACT IN AEPB
1.0000 | INHALATION_SPRAY | Freq: Every day | RESPIRATORY_TRACT | Status: DC
  Administered 2023-08-02 – 2023-08-16 (×15): 1 via RESPIRATORY_TRACT
  Filled 2023-08-01 (×4): qty 7

## 2023-08-01 MED ORDER — SODIUM CHLORIDE 0.9 % IV SOLN: INTRAVENOUS | Status: AC

## 2023-08-01 MED ORDER — ISOSORBIDE MONONITRATE ER 60 MG PO TB24
60.0000 mg | ORAL_TABLET | Freq: Every day | ORAL | Status: DC
  Administered 2023-08-01 – 2023-08-06 (×6): 60 mg via ORAL
  Filled 2023-08-01 (×6): qty 1

## 2023-08-01 MED ORDER — DARBEPOETIN ALFA 200 MCG/0.4ML IJ SOSY
200.0000 ug | PREFILLED_SYRINGE | Freq: Once | INTRAMUSCULAR | Status: DC
  Filled 2023-08-01: qty 0.4

## 2023-08-01 MED ORDER — ONDANSETRON HCL 4 MG/2ML IJ SOLN: 4.0000 mg | Freq: Four times a day (QID) | INTRAMUSCULAR | Status: DC | PRN

## 2023-08-01 MED ORDER — FLUTICASONE FUROATE-VILANTEROL 100-25 MCG/ACT IN AEPB
1.0000 | INHALATION_SPRAY | Freq: Every day | RESPIRATORY_TRACT | Status: DC
  Administered 2023-08-02 – 2023-08-16 (×15): 1 via RESPIRATORY_TRACT
  Filled 2023-08-01 (×3): qty 28

## 2023-08-01 MED ORDER — MELATONIN 3 MG PO TABS
3.0000 mg | ORAL_TABLET | Freq: Every day | ORAL | Status: DC
  Administered 2023-08-01 – 2023-08-15 (×14): 3 mg via ORAL
  Filled 2023-08-01 (×15): qty 1

## 2023-08-01 MED ORDER — IPRATROPIUM-ALBUTEROL 0.5-2.5 (3) MG/3ML IN SOLN
3.0000 mL | Freq: Once | RESPIRATORY_TRACT | Status: AC
  Administered 2023-08-01: 3 mL via RESPIRATORY_TRACT

## 2023-08-01 MED ORDER — MIRTAZAPINE 15 MG PO TABS
15.0000 mg | ORAL_TABLET | Freq: Every day | ORAL | Status: DC
  Administered 2023-08-01: 15 mg via ORAL
  Filled 2023-08-01: qty 1

## 2023-08-01 MED ORDER — POLYETHYLENE GLYCOL 3350 17 G PO PACK
17.0000 g | PACK | Freq: Every day | ORAL | Status: DC | PRN
  Administered 2023-08-08: 17 g via ORAL
  Filled 2023-08-01 (×2): qty 1

## 2023-08-01 MED ORDER — MAGNESIUM OXIDE -MG SUPPLEMENT 400 (240 MG) MG PO TABS
200.0000 mg | ORAL_TABLET | Freq: Every day | ORAL | Status: DC
  Administered 2023-08-01 – 2023-08-10 (×9): 200 mg via ORAL
  Filled 2023-08-01 (×2): qty 1
  Filled 2023-08-01 (×2): qty 0.5
  Filled 2023-08-01 (×3): qty 1
  Filled 2023-08-01: qty 0.5
  Filled 2023-08-01 (×5): qty 1

## 2023-08-01 MED ORDER — PANTOPRAZOLE SODIUM 40 MG PO TBEC
40.0000 mg | DELAYED_RELEASE_TABLET | Freq: Two times a day (BID) | ORAL | Status: DC
  Administered 2023-08-01 – 2023-08-02 (×4): 40 mg via ORAL
  Filled 2023-08-01 (×4): qty 1

## 2023-08-01 MED ORDER — IOHEXOL 350 MG/ML SOLN
75.0000 mL | Freq: Once | INTRAVENOUS | Status: AC | PRN
  Administered 2023-08-01: 75 mL via INTRAVENOUS

## 2023-08-01 MED ORDER — SENNA 8.6 MG PO TABS
1.0000 | ORAL_TABLET | Freq: Every day | ORAL | Status: DC
  Administered 2023-08-01 – 2023-08-15 (×10): 8.6 mg via ORAL
  Filled 2023-08-01 (×12): qty 1

## 2023-08-01 MED ORDER — ONDANSETRON HCL 4 MG PO TABS: 4.0000 mg | ORAL_TABLET | Freq: Four times a day (QID) | ORAL | Status: DC | PRN

## 2023-08-01 MED ORDER — ACETAMINOPHEN 650 MG RE SUPP
650.0000 mg | Freq: Four times a day (QID) | RECTAL | Status: DC | PRN
  Administered 2023-08-07: 650 mg via RECTAL
  Filled 2023-08-01: qty 1

## 2023-08-01 MED ORDER — DARBEPOETIN ALFA 100 MCG/0.5ML IJ SOSY
200.0000 ug | PREFILLED_SYRINGE | Freq: Once | INTRAMUSCULAR | Status: AC
  Administered 2023-08-01: 200 ug via SUBCUTANEOUS
  Filled 2023-08-01: qty 1

## 2023-08-01 MED ORDER — LEVOTHYROXINE SODIUM 100 MCG PO TABS
100.0000 ug | ORAL_TABLET | Freq: Every day | ORAL | Status: DC
  Administered 2023-08-02 – 2023-08-16 (×14): 100 ug via ORAL
  Filled 2023-08-01 (×14): qty 1

## 2023-08-01 MED ORDER — METOPROLOL SUCCINATE ER 25 MG PO TB24
25.0000 mg | ORAL_TABLET | Freq: Every day | ORAL | Status: DC
  Administered 2023-08-01 – 2023-08-16 (×15): 25 mg via ORAL
  Filled 2023-08-01 (×17): qty 1

## 2023-08-01 MED ORDER — SODIUM CHLORIDE 0.9 % IV SOLN: INTRAVENOUS | Status: DC

## 2023-08-01 MED ORDER — PATIROMER SORBITEX CALCIUM 8.4 G PO PACK
8.4000 g | PACK | Freq: Every day | ORAL | Status: DC
  Administered 2023-08-01 – 2023-08-05 (×5): 8.4 g via ORAL
  Filled 2023-08-01 (×6): qty 1

## 2023-08-01 MED ORDER — PRAVASTATIN SODIUM 40 MG PO TABS
40.0000 mg | ORAL_TABLET | Freq: Every evening | ORAL | Status: DC
  Administered 2023-08-01 – 2023-08-15 (×15): 40 mg via ORAL
  Filled 2023-08-01 (×17): qty 1

## 2023-08-01 MED ORDER — PSYLLIUM 95 % PO PACK
1.0000 | PACK | Freq: Every day | ORAL | Status: DC
  Administered 2023-08-01 – 2023-08-16 (×16): 1 via ORAL
  Filled 2023-08-01 (×17): qty 1

## 2023-08-01 MED ORDER — ASPIRIN 81 MG PO TBEC
81.0000 mg | DELAYED_RELEASE_TABLET | Freq: Every day | ORAL | Status: DC
  Administered 2023-08-01 – 2023-08-16 (×16): 81 mg via ORAL
  Filled 2023-08-01 (×17): qty 1

## 2023-08-01 NOTE — Assessment & Plan Note (Signed)
Not on any anticoagulation.  Amiodarone was recently discontinued. Currently in sinus rhythm. -Continue to monitor -Continue home metoprolol

## 2023-08-01 NOTE — ED Notes (Signed)
Sodium of 118 called as critical from the lab. Glee Arvin primary RN notified

## 2023-08-01 NOTE — Assessment & Plan Note (Signed)
Blood pressure mildly elevated. -Continue home metoprolol and Imdur

## 2023-08-01 NOTE — Assessment & Plan Note (Signed)
S/p recent NSTEMI, which was managed medically. No chest pain and troponin currently negative -Continue home aspirin, statin, metoprolol and Imdur

## 2023-08-01 NOTE — Progress Notes (Signed)
NEW ADMISSION NOTE New Admission Note:   Arrival Method: Patient arrived from ED  Mental Orientation: alert and oriented x 4, HOH Telemetry: N/A Assessment: Completed Skin: Dry and intact. IV: R AC and L H  infusing NS @100ml /hr Pain: denies any pain. Tubes: N/A Safety Measures: Safety Fall Prevention Plan has been given, discussed and signed Admission: Completed [facility] Orientation: Patient has been orientated to the room, unit and staff.  Family: daughter at bedside.  Orders have been reviewed and implemented. Will continue to monitor the patient. Call light has been placed within reach and bed alarm has been activated.   Arvilla Meres, RN

## 2023-08-01 NOTE — Assessment & Plan Note (Signed)
Uric acid currently normal. -Continue to monitor

## 2023-08-01 NOTE — ED Notes (Addendum)
Amin MD present in room made aware of  Sodium 118.

## 2023-08-01 NOTE — Assessment & Plan Note (Signed)
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 Currently being managed conservatively. Recent bone marrow biopsy with increase in blast from 10 to 15% Might not be a candidate for any active management based on advanced age per oncology note. -Palliative care consult -Smear review

## 2023-08-01 NOTE — Consult Note (Cosign Needed Addendum)
Palliative Medicine Inpatient Consult Note  Consulting Provider: Arnetha Courser, MD   Reason for consult:   Palliative Care Consult Services Palliative Medicine Consult  Reason for Consult? To discuss goals of care   08/01/2023  HPI:  Per intake H&P -->  Lisa [name] is a 89 y.o. female with medical history significant of  chronic HFpEF, CKD stage IIIb, severe pulmonary hypertension, history of lung adenocarcinoma, MDS>> CML, chronic neutropenia, moderate AS/mild MR, AAA, NSTEMI, TIA, paroxysmal atrial fibrillation-not on anticoagulation, hypothyroidism, and COPD with chronic hypoxemia 2-3 L baseline oxygen use,  presented to the hospital with worsening shortness of breath, worsening weakness, and poor p.o. intake for the past few days.   Palliative care has been asked to get involved to assist with additional goals of care conversations.   Clinical Assessment/Goals of Care:  *Please note that this is a verbal dictation therefore any spelling or grammatical errors are due to the "Dragon Medical One" system interpretation.  I have reviewed medical records including EPIC notes, labs and imaging, received report from bedside RN, assessed the patient who is lying on a gurney in NAD.    I met with Lisa [name] and her daughter, Lisa [name] to further discuss diagnosis prognosis, GOC, EOL wishes, disposition and options.   I introduced Palliative Medicine as specialized medical care for people living with serious illness. It focuses on providing relief from the symptoms and stress of a serious illness. The goal is to improve quality of life for both the patient and the family.  Medical History Review and Understanding:  A review of [name] past medical history of diastolic heart failure, COPD on O2 at 2LPM at home,  chronic kidney disease, pulmonary hypertension, myelodysplastic syndrome, abdominal aortic aneurysm, and paroxysmal atrial fibrillation.  Social History:  Lisa [name] is from [facility] originally.  She is a widow though married her husband very young and traveled in the setting of him being in the [employer]. Lisa [name] settled back in [place] and had 3 children, she has 3 grandchildren as well, and 7 great-grandchildren. Lisa [name] gets joy out of reading, talking on the phone, going out, and practicing her faith. Lisa [name] is of the [employer].  Functional and Nutritional State:  Preceding hospitalization Lisa [name]'s daughter lives with her provides caregiving services to her. She has been gradually weakening since 2020. Lisa [name] recently had a stay at [facility] and upon discharge required a moderate level of assistance.   Lisa [name] has had a poor appetite for three days now.   Palliative Symptoms:  Adult FTT - Has been going on for two days. Will request nutrition consult.  Weakness - Ongoing since July, not improving despite aggressive mobility efforts.  Nausea - New symptom overnight has since resolved.  Agitation - Discussed with Lisa [name] the likelihood of delirium in this setting though shared the need to rule out underlying causes also. Hyponatremia is being addressed. A UA was complete and negative.   Advance Directives:  A detailed discussion was had today regarding advanced directives.  Will try to complete these while patient is hospitalized. Prior documents had never been signed.   Code Status:  Concepts specific to code status, artifical feeding and hydration, continued IV antibiotics and rehospitalization was had.  The difference between a aggressive medical intervention path  and a palliative comfort care path for this patient at this time was had.   Encouraged patient/family to consider DNR/DNI status understanding evidenced based poor outcomes in similar hospitalized patient, as the cause of arrest is likely  associated with advanced chronic/terminal illness rather than an easily reversible acute cardio-pulmonary event. I explained that DNR/DNI does not change  the medical plan and it only comes into effect after a person has arrested (died).  It is a protective measure to keep [place] from harming the patient in their last moments of life.   Lisa [name] herself shares that she would not desire chest compressions, shocks, or intubation. Patient and her daughter would however like the opportunity to speak to the rest of the family about this.   I provided patient and her daughter with both a "Hard Choices" Book and a MOST form.   Discussion:  I reviewed with Lisa [name] decline over the past four years. In the past year or so Lisa [name] has gotten more weakened and endured multiple hospitalizations.   Reviewed the importance of consideration of quality of life in all things that are done medically. Discussed the importance of weighing risk versus benefit of interventions. Patient is interested in continued care and medical management.  Discussed the differences between Palliative and Hospice support. At this time, Lisa [name] does not desire hospice as she still wants to treat the treatable.   Discussed working with the primary team and the plan for medical optimization.   Discussed the importance of continued conversation with family and their  medical providers regarding overall plan of care and treatment options, ensuring decisions are within the context of the patients values and GOCs. ______________________ Addendum:  I went to bedside to see Lisa [name].   I spoke to patients grandson, Lisa [name] on the phone to provide an update. He shares that Lisa [name] is inconsistent with what she wants in terms of code status. He agrees that the family should all speak as one to help make this decision.   Lisa [name] shared that she and the family should speak tonight.   We reviewed an updated advanced directive as the prior one had never been signed and the completion of a MOST form.   Additional Time: 21  Decision Maker: [name],Lisa (Daughter): [phone] (Mobile)    SUMMARY OF RECOMMENDATIONS   Patient herself wants to be DNAR/DNI though with agreement of her family --> I have provided a MOST form for review and completion. Patients daughter would like to speak to whole family about code status to make sure everyone is comfortable with DNAR/DNI. For the time being she will be Full Code  Appreciate Chaplain assistance with AD's  MOST form provided for review and completion  Continue to Treat the treatable  Plan for medical optimization and patient to transition home  Ongoing PMT support   Symptom Management:  Adult FTT: - Nutrition consult - 1:1 feeding - Provide palatable options - On Remeron QHS  Weakness: - PT/OT  Nausea: - Continue Zofran as needed  Agitation: - Ensure strict delirium precautions  Palliative Prophylaxis:  Aspiration, Bowel Regimen, Delirium Protocol, Frequent Pain Assessment, Oral Care, Palliative Wound Care, and Turn Reposition  Additional Recommendations (Limitations, Scope, Preferences): Continue current care  Psycho-social/Spiritual:  Desire for further Chaplaincy support: Yes Additional Recommendations: Education on chronic disease   Prognosis: High chronic disease burden and multiple re-hospitalizations place Lisa [name] at a high 12 month mortality risk.  Discharge Planning: Discharge plan to be determined.   Vitals:   08/01/23 1200 08/01/23 1215  BP: (!) 154/62 (!) 159/78  Pulse: 65 70  Resp: (!) 21   Temp: 97.8 F (36.6 C)   SpO2: 100% 93%    Intake/Output Summary (Last 24 hours) at 08/01/2023 1236  Last data filed at 08/01/2023 0350 Gross per 24 hour  Intake --  Output 500 ml  Net -500 ml   Gen: Elderly AA F in NAD HEENT: moist mucous membranes CV: Regular rate and rhythm  PULM: On 3LPM [place], breathing is nonlabored  ABD: soft/nontender  EXT: No edema  Neuro: Alert and oriented x3   PPS: 40%   This conversation/these recommendations were discussed with patient primary care team, Dr.  Nelson Chimes  Billing based on MDM: High  Problems Addressed: One acute or chronic illness or injury that poses a threat to life or bodily function  Amount and/or Complexity of Data: Category 3:Discussion of management or test interpretation with external physician/other qualified health care professional/appropriate source (not separately reported)  Risks: Decision regarding hospitalization or escalation of hospital care and Decision not to resuscitate or to de-escalate care because of poor prognosis ______________________________________________________ [facility] Palliative Medicine Team Team Cell Phone: [phone] Please utilize secure chat with additional questions, if there is no response within 30 minutes please call the above phone number  Palliative Medicine Team providers are available by phone from 7am to 7pm daily and can be reached through the team cell phone.  Should this patient require assistance outside of these hours, please call the patient's attending physician.

## 2023-08-01 NOTE — Assessment & Plan Note (Addendum)
Seems chronic and is being managed by granulocyte and erythropoiesis stimulating agents.  White cell and platelets seems around baseline with decrease of hemoglobin. -Giving a dose of Aranesp as recommended by oncology -Continue to monitor

## 2023-08-01 NOTE — ED Provider Notes (Signed)
[place] EMERGENCY DEPARTMENT AT [facility] Provider Note   CSN: [id number] Arrival date & time: 07/31/23  2215     History  Chief Complaint  Patient presents with   Abdominal Pain   Shortness of Breath    Knee Pain     Lisa [name] is a 89 y.o. female.  The history is provided by the patient and a relative. The history is limited by the condition of the patient.  Abdominal Pain Pain location:  Generalized Pain severity:  Moderate Onset quality:  Gradual Timing:  Constant Progression:  Unchanged Chronicity:  New Context: eating   Context: not suspicious food intake   Context comment:  Is able to tolerate liquids but has emesis to all solids Relieved by:  Nothing Worsened by:  Nothing Ineffective treatments:  None tried Associated symptoms: chest pain, shortness of breath and vomiting   Risk factors: recent hospitalization   Shortness of Breath Severity:  Moderate Onset quality:  Gradual Timing:  Constant Progression:  Unchanged Chronicity:  Recurrent Context: not activity   Relieved by:  Nothing Worsened by:  Nothing Associated symptoms: abdominal pain, chest pain and vomiting   Associated symptoms: no diaphoresis, no rash and no wheezing   Risk factors: no recent surgery   Patient with a history of breast cancer and hypertension presents with shortness of breath, chest tightness and abdominal pain as well as emesis to all solids.  Patient is also experiencing generalized fatigue.      Past Medical History:  Diagnosis Date   AAA (abdominal aortic aneurysm) (HCC)    Adenocarcinoma of left lung (HCC) 2006   AF (paroxysmal atrial fibrillation) (HCC)    Aortic stenosis    Arthritis    Asthma    Cancer of breast, female (HCC)    Cancer of lung (HCC)    Dysrhythmia    Hypertension    Hypothyroidism    Invasive ductal carcinoma of left breast (HCC) 1999   Mitral regurgitation    Myocardial infarction (HCC)    Neutropenia (HCC) 06/09/2016    Personal history of radiation therapy    Pulmonary hypertension (HCC)      Home Medications Prior to Admission medications   Medication Sig Start Date End Date Taking? Authorizing Provider  acetaminophen (TYLENOL) 325 MG tablet Take 1.5-3 tablets (487.5-975 mg total) by mouth every 6 (six) hours as needed for mild pain (or Fever >/= 101). 07/15/23  Yes Johnson, Clanford L, MD  albuterol (VENTOLIN HFA) 108 (90 Base) MCG/ACT inhaler Inhale 1-2 puffs into the lungs every 4 (four) hours as needed for wheezing or shortness of breath. 05/25/22  Yes [provider]  aspirin EC 81 MG tablet Take 1 tablet (81 mg total) by mouth daily. Swallow whole. 07/16/23  Yes Johnson, Clanford L, MD  Fluticasone-Umeclidin-Vilant (TRELEGY ELLIPTA) 100-62.5-25 MCG/ACT AEPB Inhale 1 puff into the lungs daily. 12/18/22  Yes Oretha Milch, MD  isosorbide mononitrate (IMDUR) 60 MG 24 hr tablet Take 1 tablet (60 mg total) by mouth daily. 07/15/23  Yes Johnson, Clanford L, MD  levothyroxine (SYNTHROID) 100 MCG tablet Take 1 tablet (100 mcg total) by mouth daily before breakfast. 08/28/21  Yes Johnson, Clanford L, MD  magnesium oxide (MAG-OX) 400 MG tablet Take 0.5 tablets (200 mg total) by mouth at bedtime. 06/11/23  Yes Setzer, Lynnell Jude, PA-C  melatonin 3 MG TABS tablet Take 1 tablet (3 mg total) by mouth at bedtime. 06/11/23  Yes Setzer, Lynnell Jude, PA-C  metoprolol succinate (TOPROL-XL)  25 MG 24 hr tablet TAKE 1 TABLET DAILY 02/18/23  Yes Mallipeddi, Vishnu P, MD  mirtazapine (REMERON) 15 MG tablet Take 1 tablet (15 mg total) by mouth at bedtime. 06/11/23  Yes Setzer, Lynnell Jude, PA-C  Multiple Vitamin (MULTI-VITAMIN) tablet Take 1 tablet by mouth daily.   Yes [provider]  pantoprazole (PROTONIX) 40 MG tablet Take 1 tablet (40 mg total) by mouth 2 (two) times daily. 04/09/23  Yes Johnson, Clanford L, MD  pravastatin (PRAVACHOL) 40 MG tablet Take 1 tablet (40 mg total) by mouth every evening. 07/15/23  Yes Johnson,  Clanford L, MD  psyllium (HYDROCIL/METAMUCIL) 95 % PACK Take 1 packet by mouth daily. 06/11/23  Yes Setzer, Lynnell Jude, PA-C  senna (SENOKOT) 8.6 MG TABS tablet Take 1 tablet (8.6 mg total) by mouth at bedtime. 06/11/23  Yes Setzer, Lynnell Jude, PA-C  amiodarone (PACERONE) 200 MG tablet Take 1 po BID thru 9/17, then 1 po daily Patient not taking: Reported on 08/01/2023 07/15/23   Cleora Fleet, MD      Allergies    Meloxicam and Micardis hct [telmisartan-hctz]    Review of Systems   Review of Systems  Constitutional:  Negative for diaphoresis.  HENT:  Negative for facial swelling.   Eyes:  Negative for redness.  Respiratory:  Positive for shortness of breath. Negative for wheezing and stridor.   Cardiovascular:  Positive for chest pain.  Gastrointestinal:  Positive for abdominal pain and vomiting.  Skin:  Negative for rash.  All other systems reviewed and are negative.   Physical Exam Updated Vital Signs BP (!) 123/96 (BP Location: Left Arm)   Pulse 67   Temp 98.4 F (36.9 C) (Oral)   Resp 18   SpO2 94%  Physical Exam Vitals and nursing note reviewed.  Constitutional:      General: She is not in acute distress.    Appearance: Normal appearance. She is well-developed. She is not diaphoretic.  HENT:     Head: Normocephalic and atraumatic.     Nose: Nose normal.  Eyes:     Pupils: Pupils are equal, round, and reactive to light.  Cardiovascular:     Rate and Rhythm: Normal rate and regular rhythm.     Pulses: Normal pulses.     Heart sounds: Normal heart sounds.  Pulmonary:     Effort: Pulmonary effort is normal. No respiratory distress.     Breath sounds: Normal breath sounds. No stridor.  Abdominal:     General: Bowel sounds are normal. There is no distension.     Palpations: Abdomen is soft.     Tenderness: There is no abdominal tenderness. There is no guarding or rebound.  Musculoskeletal:        General: Normal range of motion.     Cervical back: Neck supple.   Skin:    General: Skin is warm and dry.     Capillary Refill: Capillary refill takes less than 2 seconds.     Findings: No erythema or rash.  Neurological:     General: No focal deficit present.     Mental Status: She is alert.     Deep Tendon Reflexes: Reflexes normal.  Psychiatric:        Mood and Affect: Mood normal.     ED Results / Procedures / Treatments   Labs (all labs ordered are listed, but only abnormal results are displayed) Results for orders placed or performed during the hospital encounter of 07/31/23  Resp panel by  RT-PCR (RSV, Flu A&B, Covid) Anterior Nasal Swab   Specimen: Anterior Nasal Swab  Result Value Ref Range   SARS Coronavirus 2 by RT PCR NEGATIVE NEGATIVE   Influenza A by PCR NEGATIVE NEGATIVE   Influenza B by PCR NEGATIVE NEGATIVE   Resp Syncytial Virus by PCR NEGATIVE NEGATIVE  CBC with Differential  Result Value Ref Range   WBC 2.8 (L) 4.0 - 10.5 K/uL   RBC 2.54 (L) 3.87 - 5.11 MIL/uL   Hemoglobin 7.8 (L) 12.0 - 15.0 g/dL   HCT 84.6 (L) 96.2 - 95.2 %   MCV 100.8 (H) 80.0 - 100.0 fL   MCH 30.7 26.0 - 34.0 pg   MCHC 30.5 30.0 - 36.0 g/dL   RDW 84.1 (H) 32.4 - 40.1 %   Platelets 118 (L) 150 - 400 K/uL   nRBC 19.3 (H) 0.0 - 0.2 %   Neutrophils Relative % 7 %   Neutro Abs 0.2 (LL) 1.7 - 7.7 K/uL   Lymphocytes Relative 50 %   Lymphs Abs 1.4 0.7 - 4.0 K/uL   Monocytes Relative 36 %   Monocytes Absolute 1.0 0.1 - 1.0 K/uL   Eosinophils Relative 0 %   Eosinophils Absolute 0.0 0.0 - 0.5 K/uL   Basophils Relative 0 %   Basophils Absolute 0.0 0.0 - 0.1 K/uL   WBC Morphology Moderate Left Shift (>5% metas and myelos)    Immature Granulocytes 7 %   Abs Immature Granulocytes 0.20 (H) 0.00 - 0.07 K/uL   Giant PLTs PRESENT   Comprehensive metabolic panel  Result Value Ref Range   Sodium 118 (LL) 135 - 145 mmol/L   Potassium 5.5 (H) 3.5 - 5.1 mmol/L   Chloride 83 (L) 98 - 111 mmol/L   CO2 23 22 - 32 mmol/L   Glucose, Bld 115 (H) 70 - 99 mg/dL    BUN 11 8 - 23 mg/dL   Creatinine, Ser 0.27 0.44 - 1.00 mg/dL   Calcium 8.5 (L) 8.9 - 10.3 mg/dL   Total Protein 8.7 (H) 6.5 - 8.1 g/dL   Albumin 3.3 (L) 3.5 - 5.0 g/dL   AST 32 15 - 41 U/L   ALT 12 0 - 44 U/L   Alkaline Phosphatase 45 38 - 126 U/L   Total Bilirubin 0.5 0.3 - 1.2 mg/dL   GFR, Estimated 57 (L) >60 mL/min   Anion gap 12 5 - 15  Lipase, blood  Result Value Ref Range   Lipase 49 11 - 51 U/L  Protime-INR  Result Value Ref Range   Prothrombin Time 16.9 (H) 11.4 - 15.2 seconds   INR 1.4 (H) 0.8 - 1.2  Brain natriuretic peptide  Result Value Ref Range   B Natriuretic Peptide 1,637.4 (H) 0.0 - 100.0 pg/mL  Urinalysis, Routine w reflex microscopic -Urine, Clean Catch  Result Value Ref Range   Color, Urine STRAW (A) YELLOW   APPearance CLEAR CLEAR   Specific Gravity, Urine 1.011 1.005 - 1.030   pH 7.0 5.0 - 8.0   Glucose, UA NEGATIVE NEGATIVE mg/dL   Hgb urine dipstick NEGATIVE NEGATIVE   Bilirubin Urine NEGATIVE NEGATIVE   Ketones, ur NEGATIVE NEGATIVE mg/dL   Protein, ur NEGATIVE NEGATIVE mg/dL   Nitrite NEGATIVE NEGATIVE   Leukocytes,Ua NEGATIVE NEGATIVE  Troponin I (High Sensitivity)  Result Value Ref Range   Troponin I (High Sensitivity) 14 <18 ng/L  Troponin I (High Sensitivity)  Result Value Ref Range   Troponin I (High Sensitivity) 16 <18 ng/L  CT Angio Chest PE W and/or Wo Contrast  Result Date: 08/01/2023 CLINICAL DATA:  Shortness of breath and abdominal pain, initial encounter EXAM: CT ANGIOGRAPHY CHEST CT ABDOMEN AND PELVIS WITH CONTRAST TECHNIQUE: Multidetector CT imaging of the chest was performed using the standard protocol during bolus administration of intravenous contrast. Multiplanar CT image reconstructions and MIPs were obtained to evaluate the vascular anatomy. Multidetector CT imaging of the abdomen and pelvis was performed using the standard protocol during bolus administration of intravenous contrast. RADIATION DOSE REDUCTION: This exam was  performed according to the departmental dose-optimization program which includes automated exposure control, adjustment of the mA and/or kV according to patient size and/or use of iterative reconstruction technique. CONTRAST:  75mL OMNIPAQUE IOHEXOL 350 MG/ML SOLN COMPARISON:  11/03/2022 FINDINGS: CTA CHEST FINDINGS Cardiovascular: Thoracic aorta demonstrates atherosclerotic calcifications. No aneurysmal dilatation is seen. Pulmonary artery shows a normal branching pattern bilaterally. The pulmonary artery shows no filling defect to suggest pulmonary embolism. Heart is enlarged in size. Heavy coronary calcifications are noted. Mediastinum/Nodes: Thoracic inlet is within normal limits. No hilar or mediastinal adenopathy is noted. The esophagus as visualized is within normal limits. Lungs/Pleura: Some mucous plugging is noted in the right lower lobe posteriorly. Generalized emphysematous changes are noted throughout both lungs. Changes consistent with prior left upper lobe wedge resection are seen. Patchy somewhat nodular appearing density is noted in the residual left upper lobe measuring up to 16 mm. This is new from the prior exam. This may represent inflammatory change although given the clinical history of prior lung carcinoma the possibility of recurrent disease could not be totally excluded. Musculoskeletal: No chest wall abnormality. No acute or significant osseous findings. Review of the MIP images confirms the above findings. CT ABDOMEN and PELVIS FINDINGS Hepatobiliary: No focal liver abnormality is seen. No gallstones, gallbladder wall thickening, or biliary dilatation. Pancreas: Unremarkable. No pancreatic ductal dilatation or surrounding inflammatory changes. Spleen: Normal in size without focal abnormality. Adrenals/Urinary Tract: Adrenal glands are within normal limits. Kidneys demonstrate a normal enhancement pattern. No calculi are seen. No obstructive changes noted. The bladder is well distended.  Stomach/Bowel: Scattered diverticular changes noted without evidence of diverticulitis. No obstructive or inflammatory changes are seen. Retained fecal material is noted throughout the colon consistent with a degree of constipation. The appendix is within normal limits. Small bowel and stomach are unremarkable. Vascular/Lymphatic: Atherosclerotic calcifications of the abdominal aorta are noted. Somewhat bilobed fusiform dilatation of the aorta is seen. Maximum dimension is 5.1 cm. No dissection is seen. Mural thrombus is noted. No specific venous abnormality is noted. No lymphadenopathy is seen. Reproductive: Uterus and bilateral adnexa are unremarkable. Other: No abdominal wall hernia or abnormality. No abdominopelvic ascites. Musculoskeletal: Degenerative changes of the lumbar spine are seen. Review of the MIP images confirms the above findings. IMPRESSION: CTA of the chest: No evidence of pulmonary embolism. Mucous plugging in the right lower lobe. 16 mm nodular density in the left upper lobe. Although this may be postinflammatory in nature. Given the patient's clinical history the possibility of recurrent neoplasm would deserve consideration. Short-term follow-up in 3 months is recommended. CT of the abdomen and pelvis: Abdominal aortic aneurysm measuring 5.1 cm. Recommend CTA or MRA, as appropriate, in 6 months and referral to a vascular specialist. Reference: Journal of Vascular Surgery 67.1 (2018): 2-77. J Am Coll Radiol [id number]. Diverticulosis without diverticulitis. Electronically Signed   By: Alcide Clever M.D.   On: 08/01/2023 01:52   CT ABDOMEN PELVIS W CONTRAST  Result Date:  08/01/2023 CLINICAL DATA:  Shortness of breath and abdominal pain, initial encounter EXAM: CT ANGIOGRAPHY CHEST CT ABDOMEN AND PELVIS WITH CONTRAST TECHNIQUE: Multidetector CT imaging of the chest was performed using the standard protocol during bolus administration of intravenous contrast. Multiplanar CT image  reconstructions and MIPs were obtained to evaluate the vascular anatomy. Multidetector CT imaging of the abdomen and pelvis was performed using the standard protocol during bolus administration of intravenous contrast. RADIATION DOSE REDUCTION: This exam was performed according to the departmental dose-optimization program which includes automated exposure control, adjustment of the mA and/or kV according to patient size and/or use of iterative reconstruction technique. CONTRAST:  75mL OMNIPAQUE IOHEXOL 350 MG/ML SOLN COMPARISON:  11/03/2022 FINDINGS: CTA CHEST FINDINGS Cardiovascular: Thoracic aorta demonstrates atherosclerotic calcifications. No aneurysmal dilatation is seen. Pulmonary artery shows a normal branching pattern bilaterally. The pulmonary artery shows no filling defect to suggest pulmonary embolism. Heart is enlarged in size. Heavy coronary calcifications are noted. Mediastinum/Nodes: Thoracic inlet is within normal limits. No hilar or mediastinal adenopathy is noted. The esophagus as visualized is within normal limits. Lungs/Pleura: Some mucous plugging is noted in the right lower lobe posteriorly. Generalized emphysematous changes are noted throughout both lungs. Changes consistent with prior left upper lobe wedge resection are seen. Patchy somewhat nodular appearing density is noted in the residual left upper lobe measuring up to 16 mm. This is new from the prior exam. This may represent inflammatory change although given the clinical history of prior lung carcinoma the possibility of recurrent disease could not be totally excluded. Musculoskeletal: No chest wall abnormality. No acute or significant osseous findings. Review of the MIP images confirms the above findings. CT ABDOMEN and PELVIS FINDINGS Hepatobiliary: No focal liver abnormality is seen. No gallstones, gallbladder wall thickening, or biliary dilatation. Pancreas: Unremarkable. No pancreatic ductal dilatation or surrounding inflammatory  changes. Spleen: Normal in size without focal abnormality. Adrenals/Urinary Tract: Adrenal glands are within normal limits. Kidneys demonstrate a normal enhancement pattern. No calculi are seen. No obstructive changes noted. The bladder is well distended. Stomach/Bowel: Scattered diverticular changes noted without evidence of diverticulitis. No obstructive or inflammatory changes are seen. Retained fecal material is noted throughout the colon consistent with a degree of constipation. The appendix is within normal limits. Small bowel and stomach are unremarkable. Vascular/Lymphatic: Atherosclerotic calcifications of the abdominal aorta are noted. Somewhat bilobed fusiform dilatation of the aorta is seen. Maximum dimension is 5.1 cm. No dissection is seen. Mural thrombus is noted. No specific venous abnormality is noted. No lymphadenopathy is seen. Reproductive: Uterus and bilateral adnexa are unremarkable. Other: No abdominal wall hernia or abnormality. No abdominopelvic ascites. Musculoskeletal: Degenerative changes of the lumbar spine are seen. Review of the MIP images confirms the above findings. IMPRESSION: CTA of the chest: No evidence of pulmonary embolism. Mucous plugging in the right lower lobe. 16 mm nodular density in the left upper lobe. Although this may be postinflammatory in nature. Given the patient's clinical history the possibility of recurrent neoplasm would deserve consideration. Short-term follow-up in 3 months is recommended. CT of the abdomen and pelvis: Abdominal aortic aneurysm measuring 5.1 cm. Recommend CTA or MRA, as appropriate, in 6 months and referral to a vascular specialist. Reference: Journal of Vascular Surgery 67.1 (2018): 2-77. J Am Coll Radiol [phone]. Diverticulosis without diverticulitis. Electronically Signed   By: Alcide Clever M.D.   On: 08/01/2023 01:52   DG Chest 2 View  Result Date: 07/31/2023 CLINICAL DATA:  Shortness of breath. EXAM: CHEST - 2 VIEW  COMPARISON:   Chest radiograph dated 07/12/2023. FINDINGS: Left lung base atelectasis or infiltrate. Significant improvement in the bilateral pulmonary opacities compared to prior radiograph. No pleural effusion or pneumothorax. Stable cardiac silhouette. No acute osseous pathology. IMPRESSION: Significant improvement in bibasilar opacities. Residual left lung base density may represent atelectasis or infiltrate. Electronically Signed   By: Elgie Collard M.D.   On: 07/31/2023 23:11   ECHOCARDIOGRAM COMPLETE  Result Date: 07/12/2023    ECHOCARDIOGRAM REPORT   Patient Name:   Lisa [name] Date of Exam: 07/12/2023 Medical Rec #:  [id number]      Height:       63.0 in Accession #:    [id number]     Weight:       147.0 lb Date of Birth:  [birth year]      BSA:          1.697 m Patient Age:    89 years       BP:           132/41 mmHg Patient Gender: F              HR:           65 bpm. [facility] Procedure: 2D Echo, Cardiac Doppler, Color Doppler and Strain Analysis Indications:    Dyspnea  History:        Patient has prior history of Echocardiogram examinations, most                 recent 05/20/2023. CHF, CAD and Previous Myocardial Infarction,                 TIA and COPD, Arrythmias:Atrial Fibrillation,                 Signs/Symptoms:Dyspnea; Risk Factors:Hypertension and Former                 Smoker. Breast and Lung CA.  Sonographer:    Mikki Harbor Referring Phys: [id number] Dorothe Pea BRANCH  Sonographer Comments: Global longitudinal strain was attempted. IMPRESSIONS  1. Left ventricular ejection fraction, by estimation, is 60 to 65%. The left ventricle has normal function. The left ventricle has no regional wall motion abnormalities. There is moderate left ventricular hypertrophy. Left ventricular diastolic parameters are consistent with Grade II diastolic dysfunction (pseudonormalization). Elevated left atrial pressure.  2. Right ventricular systolic function is normal. The right ventricular size is mildly  enlarged. Tricuspid regurgitation signal is inadequate for assessing PA pressure.  3. Left atrial size was severely dilated.  4. Right atrial size was mildly dilated.  5. The mitral valve is abnormal. Mild to moderate mitral valve regurgitation. No evidence of mitral stenosis.  6. The tricuspid valve is abnormal.  7. The aortic valve is tricuspid. There is severe calcifcation of the aortic valve. There is severe thickening of the aortic valve. Aortic valve regurgitation is not visualized. Moderate to severe aortic valve stenosis. Aortic valve mean gradient measures 22.0 mmHg. Aortic valve peak gradient measures 41.9 mmHg. Aortic valve area, by VTI measures 0.81 cm.  8. The inferior vena cava is normal in size with <50% respiratory variability, suggesting right atrial pressure of 8 mmHg. FINDINGS  Left Ventricle: Left ventricular ejection fraction, by estimation, is 60 to 65%. The left ventricle has normal function. The left ventricle has no regional wall motion abnormalities. Global longitudinal strain performed but not reported based on interpreter judgement due to suboptimal tracking. The left ventricular internal cavity size was normal in size.  There is moderate left ventricular hypertrophy. Left ventricular diastolic parameters are consistent with Grade II diastolic dysfunction (pseudonormalization). Elevated left atrial pressure. Right Ventricle: The right ventricular size is mildly enlarged. Right vetricular wall thickness was not well visualized. Right ventricular systolic function is normal. Tricuspid regurgitation signal is inadequate for assessing PA pressure. Left Atrium: Left atrial size was severely dilated. Right Atrium: Right atrial size was mildly dilated. Pericardium: There is no evidence of pericardial effusion. Mitral Valve: The mitral valve is abnormal. There is moderate thickening of the mitral valve leaflet(s). There is moderate calcification of the mitral valve leaflet(s). Mild to moderate  mitral annular calcification. Mild to moderate mitral valve regurgitation. No evidence of mitral valve stenosis. MV peak gradient, 6.2 mmHg. The mean mitral valve gradient is 2.0 mmHg. Tricuspid Valve: The tricuspid valve is abnormal. Tricuspid valve regurgitation is mild . No evidence of tricuspid stenosis. Aortic Valve: The aortic valve is tricuspid. There is severe calcifcation of the aortic valve. There is severe thickening of the aortic valve. There is severe aortic valve annular calcification. Aortic valve regurgitation is not visualized. Moderate to severe aortic stenosis is present. Aortic valve mean gradient measures 22.0 mmHg. Aortic valve peak gradient measures 41.9 mmHg. Aortic valve area, by VTI measures 0.81 cm. Pulmonic Valve: The pulmonic valve was not well visualized. Pulmonic valve regurgitation is mild to moderate. No evidence of pulmonic stenosis. Aorta: The aortic root and ascending aorta are structurally normal, with no evidence of dilitation. Venous: The inferior vena cava is normal in size with less than 50% respiratory variability, suggesting right atrial pressure of 8 mmHg. IAS/Shunts: No atrial level shunt detected by color flow Doppler.  LEFT VENTRICLE PLAX 2D LVIDd:         4.40 cm   Diastology LVIDs:         2.70 cm   LV e' medial:    5.43 cm/s LV PW:         1.30 cm   LV E/e' medial:  20.6 LV IVS:        1.40 cm   LV e' lateral:   8.18 cm/s LVOT diam:     2.00 cm   LV E/e' lateral: 13.7 LV SV:         69 LV SV Index:   40 LVOT Area:     3.14 cm  RIGHT VENTRICLE RV Basal diam:  3.80 cm RV Mid diam:    3.40 cm RV S prime:     11.40 cm/s TAPSE (M-mode): 2.3 cm LEFT ATRIUM             Index        RIGHT ATRIUM           Index LA diam:        4.00 cm 2.36 cm/m   RA Area:     19.70 cm LA Vol (A2C):   85.3 ml 50.27 ml/m  RA Volume:   61.80 ml  36.42 ml/m LA Vol (A4C):   77.4 ml 45.62 ml/m LA Biplane Vol: 87.7 ml 51.69 ml/m  AORTIC VALVE                     PULMONIC VALVE AV Area  (Vmax):    0.87 cm      PV Vmax:          0.94 m/s AV Area (Vmean):   0.77 cm      PV Peak grad:     3.5 mmHg  AV Area (VTI):     0.81 cm      PR End Diast Vel: 2.60 msec AV Vmax:           323.50 cm/s AV Vmean:          219.000 cm/s AV VTI:            0.845 m AV Peak Grad:      41.9 mmHg AV Mean Grad:      22.0 mmHg LVOT Vmax:         90.05 cm/s LVOT Vmean:        53.750 cm/s LVOT VTI:          0.218 m LVOT/AV VTI ratio: 0.26  AORTA Ao Root diam: 3.40 cm Ao Asc diam:  3.20 cm MITRAL VALVE MV Area (PHT): 4.15 cm       SHUNTS MV Area VTI:   2.03 cm       Systemic VTI:  0.22 m MV Peak grad:  6.2 mmHg       Systemic Diam: 2.00 cm MV Mean grad:  2.0 mmHg MV Vmax:       1.24 m/s MV Vmean:      60.2 cm/s MV Decel Time: 183 msec MR Peak grad:    137.4 mmHg MR Mean grad:    88.0 mmHg MR Vmax:         586.00 cm/s MR Vmean:        439.0 cm/s MR PISA:         1.57 cm MR PISA Eff ROA: 25 mm MR PISA Radius:  0.50 cm MV E velocity: 112.00 cm/s MV A velocity: 60.00 cm/s MV E/A ratio:  1.87 Dina Rich MD Electronically signed by Dina Rich MD Signature Date/Time: 07/12/2023/1:08:53 PM    Final    DG Chest Portable 1 View  Result Date: 07/12/2023 CLINICAL DATA:  89 year old female with difficulty breathing onset 0200 hours. Atrial fibrillation, lung cancer. EXAM: PORTABLE CHEST 1 VIEW COMPARISON:  Chest CTA 11/03/2022. Radiographs 06/09/2023 and earlier. FINDINGS: Portable AP upright view at 0838 hours. Emphysema demonstrated by CTA last year. Cardiomegaly. Calcified aortic atherosclerosis. Other mediastinal contours are within normal limits. Confluent and veiling bilateral lung base opacity appears only in part due to pleural effusions. There is some generalized increase in the pulmonary interstitium, vasculature appears indistinct. No pneumothorax. Visualized tracheal air column is within normal limits. No acute osseous abnormality identified. Chronic postoperative changes to the left axilla, chest wall. Paucity  of bowel gas in the visible abdomen. IMPRESSION: 1. Emphysema with confluent, indeterminate bibasilar opacity which seems only in part due to layering pleural effusions. Increased interstitium/vascularity elsewhere. Consider acute pulmonary edema and bilateral pleural effusions and atelectasis. Viral/atypical infection or lung base aspiration difficult to exclude. 2. Cardiomegaly, calcified aortic atherosclerosis. Electronically Signed   By: Odessa Fleming M.D.   On: 07/12/2023 09:05   ] EKG EKG Interpretation Date/Time:  Saturday July 31 2023 22:42:39 EDT Ventricular Rate:  62 PR Interval:    QRS Duration:  90 QT Interval:  434 QTC Calculation: 440 R Axis:   7  Text Interpretation: Normal sinus rhythm Confirmed by Nicanor Alcon, Judd Mccubbin ([id number]) on 08/01/2023 12:27:59 AM  Radiology CT Angio Chest PE W and/or Wo Contrast  Result Date: 08/01/2023 CLINICAL DATA:  Shortness of breath and abdominal pain, initial encounter EXAM: CT ANGIOGRAPHY CHEST CT ABDOMEN AND PELVIS WITH CONTRAST TECHNIQUE: Multidetector CT imaging of the chest was performed using the standard protocol during bolus administration of intravenous  contrast. Multiplanar CT image reconstructions and MIPs were obtained to evaluate the vascular anatomy. Multidetector CT imaging of the abdomen and pelvis was performed using the standard protocol during bolus administration of intravenous contrast. RADIATION DOSE REDUCTION: This exam was performed according to the departmental dose-optimization program which includes automated exposure control, adjustment of the mA and/or kV according to patient size and/or use of iterative reconstruction technique. CONTRAST:  75mL OMNIPAQUE IOHEXOL 350 MG/ML SOLN COMPARISON:  11/03/2022 FINDINGS: CTA CHEST FINDINGS Cardiovascular: Thoracic aorta demonstrates atherosclerotic calcifications. No aneurysmal dilatation is seen. Pulmonary artery shows a normal branching pattern bilaterally. The pulmonary artery shows no  filling defect to suggest pulmonary embolism. Heart is enlarged in size. Heavy coronary calcifications are noted. Mediastinum/Nodes: Thoracic inlet is within normal limits. No hilar or mediastinal adenopathy is noted. The esophagus as visualized is within normal limits. Lungs/Pleura: Some mucous plugging is noted in the right lower lobe posteriorly. Generalized emphysematous changes are noted throughout both lungs. Changes consistent with prior left upper lobe wedge resection are seen. Patchy somewhat nodular appearing density is noted in the residual left upper lobe measuring up to 16 mm. This is new from the prior exam. This may represent inflammatory change although given the clinical history of prior lung carcinoma the possibility of recurrent disease could not be totally excluded. Musculoskeletal: No chest wall abnormality. No acute or significant osseous findings. Review of the MIP images confirms the above findings. CT ABDOMEN and PELVIS FINDINGS Hepatobiliary: No focal liver abnormality is seen. No gallstones, gallbladder wall thickening, or biliary dilatation. Pancreas: Unremarkable. No pancreatic ductal dilatation or surrounding inflammatory changes. Spleen: Normal in size without focal abnormality. Adrenals/Urinary Tract: Adrenal glands are within normal limits. Kidneys demonstrate a normal enhancement pattern. No calculi are seen. No obstructive changes noted. The bladder is well distended. Stomach/Bowel: Scattered diverticular changes noted without evidence of diverticulitis. No obstructive or inflammatory changes are seen. Retained fecal material is noted throughout the colon consistent with a degree of constipation. The appendix is within normal limits. Small bowel and stomach are unremarkable. Vascular/Lymphatic: Atherosclerotic calcifications of the abdominal aorta are noted. Somewhat bilobed fusiform dilatation of the aorta is seen. Maximum dimension is 5.1 cm. No dissection is seen. Mural thrombus  is noted. No specific venous abnormality is noted. No lymphadenopathy is seen. Reproductive: Uterus and bilateral adnexa are unremarkable. Other: No abdominal wall hernia or abnormality. No abdominopelvic ascites. Musculoskeletal: Degenerative changes of the lumbar spine are seen. Review of the MIP images confirms the above findings. IMPRESSION: CTA of the chest: No evidence of pulmonary embolism. Mucous plugging in the right lower lobe. 16 mm nodular density in the left upper lobe. Although this may be postinflammatory in nature. Given the patient's clinical history the possibility of recurrent neoplasm would deserve consideration. Short-term follow-up in 3 months is recommended. CT of the abdomen and pelvis: Abdominal aortic aneurysm measuring 5.1 cm. Recommend CTA or MRA, as appropriate, in 6 months and referral to a vascular specialist. Reference: Journal of Vascular Surgery 67.1 (2018): 2-77. J Am Coll Radiol [phone]. Diverticulosis without diverticulitis. Electronically Signed   By: Alcide Clever M.D.   On: 08/01/2023 01:52   CT ABDOMEN PELVIS W CONTRAST  Result Date: 08/01/2023 CLINICAL DATA:  Shortness of breath and abdominal pain, initial encounter EXAM: CT ANGIOGRAPHY CHEST CT ABDOMEN AND PELVIS WITH CONTRAST TECHNIQUE: Multidetector CT imaging of the chest was performed using the standard protocol during bolus administration of intravenous contrast. Multiplanar CT image reconstructions and MIPs were obtained to evaluate  the vascular anatomy. Multidetector CT imaging of the abdomen and pelvis was performed using the standard protocol during bolus administration of intravenous contrast. RADIATION DOSE REDUCTION: This exam was performed according to the departmental dose-optimization program which includes automated exposure control, adjustment of the mA and/or kV according to patient size and/or use of iterative reconstruction technique. CONTRAST:  75mL OMNIPAQUE IOHEXOL 350 MG/ML SOLN  COMPARISON:  11/03/2022 FINDINGS: CTA CHEST FINDINGS Cardiovascular: Thoracic aorta demonstrates atherosclerotic calcifications. No aneurysmal dilatation is seen. Pulmonary artery shows a normal branching pattern bilaterally. The pulmonary artery shows no filling defect to suggest pulmonary embolism. Heart is enlarged in size. Heavy coronary calcifications are noted. Mediastinum/Nodes: Thoracic inlet is within normal limits. No hilar or mediastinal adenopathy is noted. The esophagus as visualized is within normal limits. Lungs/Pleura: Some mucous plugging is noted in the right lower lobe posteriorly. Generalized emphysematous changes are noted throughout both lungs. Changes consistent with prior left upper lobe wedge resection are seen. Patchy somewhat nodular appearing density is noted in the residual left upper lobe measuring up to 16 mm. This is new from the prior exam. This may represent inflammatory change although given the clinical history of prior lung carcinoma the possibility of recurrent disease could not be totally excluded. Musculoskeletal: No chest wall abnormality. No acute or significant osseous findings. Review of the MIP images confirms the above findings. CT ABDOMEN and PELVIS FINDINGS Hepatobiliary: No focal liver abnormality is seen. No gallstones, gallbladder wall thickening, or biliary dilatation. Pancreas: Unremarkable. No pancreatic ductal dilatation or surrounding inflammatory changes. Spleen: Normal in size without focal abnormality. Adrenals/Urinary Tract: Adrenal glands are within normal limits. Kidneys demonstrate a normal enhancement pattern. No calculi are seen. No obstructive changes noted. The bladder is well distended. Stomach/Bowel: Scattered diverticular changes noted without evidence of diverticulitis. No obstructive or inflammatory changes are seen. Retained fecal material is noted throughout the colon consistent with a degree of constipation. The appendix is within normal  limits. Small bowel and stomach are unremarkable. Vascular/Lymphatic: Atherosclerotic calcifications of the abdominal aorta are noted. Somewhat bilobed fusiform dilatation of the aorta is seen. Maximum dimension is 5.1 cm. No dissection is seen. Mural thrombus is noted. No specific venous abnormality is noted. No lymphadenopathy is seen. Reproductive: Uterus and bilateral adnexa are unremarkable. Other: No abdominal wall hernia or abnormality. No abdominopelvic ascites. Musculoskeletal: Degenerative changes of the lumbar spine are seen. Review of the MIP images confirms the above findings. IMPRESSION: CTA of the chest: No evidence of pulmonary embolism. Mucous plugging in the right lower lobe. 16 mm nodular density in the left upper lobe. Although this may be postinflammatory in nature. Given the patient's clinical history the possibility of recurrent neoplasm would deserve consideration. Short-term follow-up in 3 months is recommended. CT of the abdomen and pelvis: Abdominal aortic aneurysm measuring 5.1 cm. Recommend CTA or MRA, as appropriate, in 6 months and referral to a vascular specialist. Reference: Journal of Vascular Surgery 67.1 (2018): 2-77. J Am Coll Radiol [phone]. Diverticulosis without diverticulitis. Electronically Signed   By: Alcide Clever M.D.   On: 08/01/2023 01:52   DG Chest 2 View  Result Date: 07/31/2023 CLINICAL DATA:  Shortness of breath. EXAM: CHEST - 2 VIEW COMPARISON:  Chest radiograph dated 07/12/2023. FINDINGS: Left lung base atelectasis or infiltrate. Significant improvement in the bilateral pulmonary opacities compared to prior radiograph. No pleural effusion or pneumothorax. Stable cardiac silhouette. No acute osseous pathology. IMPRESSION: Significant improvement in bibasilar opacities. Residual left lung base density may represent atelectasis or  infiltrate. Electronically Signed   By: Elgie Collard M.D.   On: 07/31/2023 23:11    Procedures Procedures     Medications Ordered in ED Medications  sodium zirconium cyclosilicate (LOKELMA) packet 10 g (has no administration in time range)  ipratropium-albuterol (DUONEB) 0.5-2.5 (3) MG/3ML nebulizer solution 3 mL (has no administration in time range)  ipratropium-albuterol (DUONEB) 0.5-2.5 (3) MG/3ML nebulizer solution 3 mL (3 mLs Nebulization Given 08/01/23 0050)  iohexol (OMNIPAQUE) 350 MG/ML injection 75 mL (75 mLs Intravenous Contrast Given 08/01/23 0136)     ED Course/ Medical Decision Making/ A&P                                 Medical Decision Making Patient with complex past medical history including CML presents with chest pain shortness of breath vomiting and abdominal pain   Amount and/or Complexity of Data Reviewed Independent Historian:     Details: Family members see above  External Data Reviewed: labs and notes.    Details: Previous notes and labs reviewed  Labs: ordered.    Details: Negative covid and flu, urine is negative for UTI.  2 negative troponins 14/16.  White count low 2.8 (called initially by labs with PMN of 0, on smear ANC 20 neutropenia). Patient has anemia with hemoglobin 7.8, low platelets 118 but stable.  Sodium critically low 118, potassium slight elevation 5.5, normal creatinine 0.96, albumin slightly low but other liver functions tests are normal.   BNP elevated 1637.4, lipase is normal 49  Radiology: ordered and independent interpretation performed.    Details: No PE by me on CTA, no free air on CT abdomen by me  ECG/medicine tests: ordered and independent interpretation performed. Decision-making details documented in ED Course.  Risk Prescription drug management. Decision regarding hospitalization. Risk Details: I have started sodium correction at a rate of 300cc/hr     Final Clinical Impression(s) / ED Diagnoses Final diagnoses:  Neutropenia, unspecified type (HCC)  Hyponatremia  Nausea and vomiting, unspecified vomiting type   The patient  appears reasonably stabilized for admission considering the current resources, flow, and capabilities available in the ED at this time, and I doubt any other [facility] requiring further screening and/or treatment in the ED prior to admission.  Rx / DC Orders ED Discharge Orders     None         Adlynn Lowenstein, MD 08/01/23 [phone]

## 2023-08-01 NOTE — Assessment & Plan Note (Signed)
Appears to have CKD stage III A. -Creatinine at baseline -Monitor renal function -Avoid nephrotoxins

## 2023-08-01 NOTE — Assessment & Plan Note (Signed)
Patient has hypoosmolar hyponatremia.  Clinically appears dry with mildly elevated BNP.  Also concern of SIADH with underlying lung malignancy.  Likely contributory to her weakness Admitted to [facility] has consulted. Giving some gentle IV fluid with normal saline Monitor sodium closely-if started trending down with IV fluid we will place her on salt tablets and water restriction.

## 2023-08-01 NOTE — Assessment & Plan Note (Addendum)
History of lung cancer History of CMML Patient with multiple malignancies.  LDH elevated -Being managed by oncology as outpatient -Patient gets regular colony-stimulating and erythropoietin stimulating agents by oncology -Palliative care consulted

## 2023-08-01 NOTE — Assessment & Plan Note (Signed)
Being managed by outpatient cardiology

## 2023-08-01 NOTE — H&P (Addendum)
History and Physical    Patient: Lisa [name] DOB: 1934/09/02 DOA: 07/31/2023 DOS: the patient was seen and examined on 08/01/2023 PCP: [name], MD  Patient coming from: Home  Chief Complaint:  Chief Complaint  Patient presents with   Abdominal Pain   Shortness of Breath    Knee Pain    HPI: Lisa [name] is a 89 y.o. female with medical history significant of  chronic HFpEF, CKD stage IIIb, severe pulmonary hypertension, history of lung adenocarcinoma, MDS>> CML, chronic neutropenia, moderate AS/mild MR, AAA, NSTEMI, TIA, paroxysmal atrial fibrillation-not on anticoagulation, hypothyroidism, and COPD with chronic hypoxemia 2-3 L baseline oxygen use,  presented to the hospital with worsening shortness of breath, worsening weakness, and poor p.o. intake for the past few days.  Patient had multiple recent hospitalizations with hypoxic failure, sepsis and NSTEMI over the past couple of month. Recently discharged from [facility] on 07/15/2023.  Recent NSTEMI with recommendation to managed medically.  Per son patient with gradually declining health, over the past few days she was not eating and drinking well and progressively becoming more weak.  Patient was having some abdominal discomfort and stated that she noted mild hematuria at home, UA was negative for any blood here.  No recent upper respiratory infections, chest pain or shortness of breath.  No fever but do endorse some chills at home.  No nausea or vomiting.  Family and patient does not want very aggressive measures, only medical management.  ED course and data reviewed.  On arrival to ED mildly elevated blood pressure at 167/73, saturating well on baseline oxygen requirement of 2 L, labs pertinent for leukopenia which seems at her baseline, absolute neutrophil of 0.2, hemoglobin 7.8 >>7.6, MCV 100.8, platelets 118, sodium 118, potassium 5.5, chloride 83, troponin negative, BNP elevated at 1637, UA negative for UTI,  serum osmolality low at 271, urine osmolality low at 170, urine sodium 29. EKG with normal sinus rhythm.  CTA chest was negative for PE, some mucous plugging in right lower lobe and a 16mm nodular density in the left upper lobe, could represent recurrent neoplasm and they were recommending follow-up CT in 78-month.  CT abdomen and pelvis with AAA measuring 5.1 with recommendation of follow-up in 10-month and a referral to a vascular specialist.  Nephrology was also consulted and she is being given some gentle IV fluid with normal saline as clinically appears dry.   Review of Systems: As mentioned in the history of present illness. All other systems reviewed and are negative. Past Medical History:  Diagnosis Date   AAA (abdominal aortic aneurysm) (HCC)    Adenocarcinoma of left lung (HCC) 2006   AF (paroxysmal atrial fibrillation) (HCC)    Aortic stenosis    Arthritis    Asthma    Cancer of breast, female (HCC)    Cancer of lung (HCC)    Dysrhythmia    Hypertension    Hypothyroidism    Invasive ductal carcinoma of left breast (HCC) 1999   Mitral regurgitation    Myocardial infarction (HCC)    Neutropenia (HCC) 06/09/2016   Personal history of radiation therapy    Pulmonary hypertension (HCC)    Past Surgical History:  Procedure Laterality Date   BIOPSY  10/23/2020   Procedure: BIOPSY;  Surgeon: Benancio Deeds, MD;  Location: [facility] ENDOSCOPY;  Service: Gastroenterology;;   BREAST BIOPSY     left axillary node dissection   BREAST LUMPECTOMY Left    COLONOSCOPY  03/08/2003  [id number] rectal polyps destroyed with the tip of the snare/Polyps at hepatic flexure, splenic flexure at 35 cm/Left-sided diverticula: unable to retrieve path   COLONOSCOPY  09/12/2008   JXB:[id number] rectum and distal sigmoid diminutive polyps/scattered left sided diverticulum. hyperplastic   COLONOSCOPY N/A 03/06/2013   [id number] polyp-removed as described above; colonic diverticulosis. hyperplastic  polyps. next TCS 03/2018   ESOPHAGOGASTRODUODENOSCOPY (EGD) WITH PROPOFOL N/A 10/23/2020   Procedure: ESOPHAGOGASTRODUODENOSCOPY (EGD) WITH PROPOFOL;  Surgeon: Benancio Deeds, MD;  Location: [facility] ENDOSCOPY;  Service: Gastroenterology;  Laterality: N/A;   FOOT SURGERY     LUNG REMOVAL, PARTIAL     upper lobe   Social History:  reports that she quit smoking about 31 years ago. Her smoking use included cigarettes. She started smoking about 66 years ago. She has a 26.3 pack-year smoking history. She has never used smokeless tobacco. She reports that she does not drink alcohol and does not use drugs.  Allergies  Allergen Reactions   Meloxicam Other (See Comments)    Caused an injury to the kidneys, per nephrologist   Micardis Hct [Telmisartan-Hctz] Other (See Comments)    Caused an injury to the kidneys, per nephrologist    Family History  Problem Relation Age of Onset   Cancer Mother    Cancer Sister    Colon cancer Neg Hx     Prior to Admission medications   Medication Sig Start Date End Date Taking? Authorizing Provider  acetaminophen (TYLENOL) 325 MG tablet Take 1.5-3 tablets (487.5-975 mg total) by mouth every 6 (six) hours as needed for mild pain (or Fever >/= 101). 07/15/23  Yes Johnson, Clanford L, MD  albuterol (VENTOLIN HFA) 108 (90 Base) MCG/ACT inhaler Inhale 1-2 puffs into the lungs every 4 (four) hours as needed for wheezing or shortness of breath. 05/25/22  Yes [provider]  aspirin EC 81 MG tablet Take 1 tablet (81 mg total) by mouth daily. Swallow whole. 07/16/23  Yes Johnson, Clanford L, MD  Fluticasone-Umeclidin-Vilant (TRELEGY ELLIPTA) 100-62.5-25 MCG/ACT AEPB Inhale 1 puff into the lungs daily. 12/18/22  Yes Oretha Milch, MD  isosorbide mononitrate (IMDUR) 60 MG 24 hr tablet Take 1 tablet (60 mg total) by mouth daily. 07/15/23  Yes Johnson, Clanford L, MD  levothyroxine (SYNTHROID) 100 MCG tablet Take 1 tablet (100 mcg total) by mouth daily before breakfast.  08/28/21  Yes Johnson, Clanford L, MD  magnesium oxide (MAG-OX) 400 MG tablet Take 0.5 tablets (200 mg total) by mouth at bedtime. 06/11/23  Yes Setzer, Lynnell Jude, PA-C  melatonin 3 MG TABS tablet Take 1 tablet (3 mg total) by mouth at bedtime. 06/11/23  Yes Setzer, Lynnell Jude, PA-C  metoprolol succinate (TOPROL-XL) 25 MG 24 hr tablet TAKE 1 TABLET DAILY 02/18/23  Yes Mallipeddi, Vishnu P, MD  mirtazapine (REMERON) 15 MG tablet Take 1 tablet (15 mg total) by mouth at bedtime. 06/11/23  Yes Setzer, Lynnell Jude, PA-C  Multiple Vitamin (MULTI-VITAMIN) tablet Take 1 tablet by mouth daily.   Yes [provider]  pantoprazole (PROTONIX) 40 MG tablet Take 1 tablet (40 mg total) by mouth 2 (two) times daily. 04/09/23  Yes Johnson, Clanford L, MD  pravastatin (PRAVACHOL) 40 MG tablet Take 1 tablet (40 mg total) by mouth every evening. 07/15/23  Yes Johnson, Clanford L, MD  psyllium (HYDROCIL/METAMUCIL) 95 % PACK Take 1 packet by mouth daily. 06/11/23  Yes Setzer, Lynnell Jude, PA-C  senna (SENOKOT) 8.6 MG TABS tablet Take 1 tablet (8.6 mg total) by mouth at  bedtime. 06/11/23  Yes Setzer, Lynnell Jude, PA-C  amiodarone (PACERONE) 200 MG tablet Take 1 po BID thru 9/17, then 1 po daily Patient not taking: Reported on 08/01/2023 07/15/23   Cleora Fleet, MD    Physical Exam: Vitals:   08/01/23 1124 08/01/23 1126 08/01/23 1200 08/01/23 1215  BP: (!) 186/82  (!) 154/62 (!) 159/78  Pulse: 68  65 70  Resp: 20  (!) 21   Temp:  97.8 F (36.6 C) 97.8 F (36.6 C)   TempSrc:  Oral Oral   SpO2: 98%  100% 93%    General: Vital signs reviewed.  Patient is well-developed and well-nourished, in no acute distress and cooperative with exam.  Head: Normocephalic and atraumatic. Eyes: EOMI, conjunctivae normal, no scleral icterus.  Neck: Supple, trachea midline, normal ROM, no JVD, Cardiovascular: RRR, S1 normal, S2 normal, no murmurs, gallops, or rubs. Pulmonary/Chest: Clear to auscultation bilaterally, no wheezes, rales, or  rhonchi. Abdominal: Soft, non-tender, non-distended, BS +, Extremities: No lower extremity edema bilaterally,  pulses symmetric and intact bilaterally. No cyanosis or clubbing. Neurological: A&O x3, Strength is normal and symmetric bilaterally, cranial nerve II-XII are grossly intact, no focal motor deficit, sensory intact to light touch bilaterally.  Skin: Warm, dry and intact. No rashes or erythema. Psychiatric: Normal mood and affect.   Data Reviewed: Prior data reviewed as mentioned above  Assessment and Plan: * Hyponatremia Patient has hypoosmolar hyponatremia.  Clinically appears dry with mildly elevated BNP.  Also concern of SIADH with underlying lung malignancy.  Likely contributory to her weakness Admit to MedSurg Nephrology was also consulted. Giving some gentle IV fluid with normal saline Monitor sodium closely-if started trending down with IV fluid we will place her on salt tablets and water restriction.  CMML (chronic myelomonocytic leukemia) (HCC) -Being managed by outpatient oncology/hematology, according to oncology note high risk dysplastic CMML with hypomethylating agent. Currently being managed conservatively. Recent bone marrow biopsy with increase in blast from 10 to 15% Might not be a candidate for any active management based on advanced age per oncology note. -Palliative care consult -Smear review  Pancytopenia (HCC) Seems chronic and is being managed by granulocyte and erythropoiesis stimulating agents.  White cell and platelets seems around baseline with decrease of hemoglobin. -Giving a dose of Aranesp as recommended by oncology -Continue to monitor  Invasive ductal carcinoma of left breast (HCC) History of lung cancer History of CMML Patient with multiple malignancies.  LDH elevated -Being managed by oncology as outpatient -Patient gets regular colony-stimulating and erythropoietin stimulating agents by oncology -Palliative care consult  Chronic  respiratory failure with hypoxia (HCC) No acute concern, on baseline oxygen use of 2 to 3 L. -Continue home bronchodilators  CAD (coronary artery disease) S/p recent NSTEMI, which was managed medically. No chest pain and troponin currently negative -Continue home aspirin, statin, metoprolol and Imdur  Hypothyroidism Check TSH. -Continue home Synthroid  Nonrheumatic aortic valve stenosis Being managed by outpatient cardiology  HTN (hypertension) Blood pressure mildly elevated. -Continue home metoprolol and Imdur  CKD (chronic kidney disease), stage III (HCC) Appears to have CKD stage III A. -Creatinine at baseline -Monitor renal function -Avoid nephrotoxins  Abdominal aortic aneurysm CT abdomen with abdominal aneurysm of 5.1 cm -Need 54-month follow-up -Outpatient vascular surgery evaluation  Paroxysmal atrial fibrillation (HCC) Not on any anticoagulation.  Amiodarone was recently discontinued. Currently in sinus rhythm. -Continue to monitor -Continue home metoprolol  Hyperkalemia Potassium 5.5>>5.2 Patient initially received Lokelma -Veltassa daily -Monitor potassium  Generalized muscle  weakness Multifactorial with hyponatremia, multiple chronic comorbidities and malignancies. -PT/OT evaluation -Palliative care consult  Gout Uric acid currently normal. -Continue to monitor  Malnutrition of moderate degree -Dietitian consult     Advance Care Planning:   Code Status: Limited: Do not attempt resuscitation (DNR) -DNR-LIMITED -Do Not Intubate/DNI patient and family wants full medical management Discussed with patient and son  Consults: Nephrology                  Palliative care  Family Communication: Discussed with son at bedside  Severity of Illness: The appropriate patient status for this patient is INPATIENT. Inpatient status is judged to be reasonable and necessary in order to provide the required intensity of service to ensure the patient's safety.  The patient's presenting symptoms, physical exam findings, and initial radiographic and laboratory data in the context of their chronic comorbidities is felt to place them at high risk for further clinical deterioration. Furthermore, it is not anticipated that the patient will be medically stable for discharge from the hospital within 2 midnights of admission.   * I certify that at the point of admission it is my clinical judgment that the patient will require inpatient hospital care spanning beyond 2 midnights from the point of admission due to high intensity of service, high risk for further deterioration and high frequency of surveillance required.*  This record has been created using Conservation officer, historic buildings. Errors have been sought and corrected,but may not always be located. Such creation errors do not reflect on the standard of care.   Author: Arnetha Courser, MD 08/01/2023 12:48 PM  For on call review [email or web address].

## 2023-08-01 NOTE — Assessment & Plan Note (Signed)
No acute concern, on baseline oxygen use of 2 to 3 L. -Continue home bronchodilators

## 2023-08-01 NOTE — Assessment & Plan Note (Signed)
-  Dietitian consult 

## 2023-08-01 NOTE — Consult Note (Addendum)
Nephrology Consult   Requesting provider: Arnetha Courser Service requesting consult: Hospitalist Reason for consult: Symptomatic Hyponatremia   Assessment/Recommendations: [name] is a/an 89 y.o. female with a past medical history HFpEF, CKD, pulmonary hypertension, aortic stenosis, CAD, atrial fibrillation, CML, COPD who present w/ symptomatic hyponatremia   Symptomatic hyponatremia: Based on urine lites and history likely related to poor p.o. intake of solutes with concomitant overhydration.  Volume overload could also look similar but based on her exam this is unlikely.  Could have some mild underlying SIADH as well -Sodium improving already with hydration alone -Continue with normal saline 100 cc/h -Monitor BMP every 4 hours -Goal sodium 124 to 126 by 7 a.m. tomorrow morning -Monitor neurological status closely -Not on any culprit meds for SIADH -Could consider urea  Hyperkalemia: Seems to be a recurrent issue.  Has improved somewhat with Lokelma already.  Home for patiromer ordered if on formulary.  Could do Lokelma daily if needed  Pulmonary hypertension/recent NSTEMI/CHF: Does not appear volume overloaded at this time.  Hydration as above.  Monitor respiratory status closely  Chronic hypoxic respiratory failure/COPD: Management per primary  Failure to thrive: This is the largest underlying issue.  Appreciate help from palliative care  Anemia: Multifactorial related to malignancy.  On home ESA  CML   Recommendations conveyed to primary service.    Lisa [name] 08/01/2023 12:38 PM   _____________________________________________________________________________________ CC: Abdominal pain, nausea, vomiting, confusion  History of Present Illness: Lisa [name] is a/an 89 y.o. female with a past medical history of HFpEF, CKD, pulmonary hypertension, aortic stenosis, CAD, atrial fibrillation, CML, COPD who presents with  confusion.  Because of the patient's confusion history was largely obtained from her daughter at the bedside.  As well as chart review.  Patient was discharged from the hospital on 9/12 after being hospitalized with shortness of breath and NSTEMI that was medically managed.  The daughter states that the patient has become more confused over the past 2 to 3 days.  She is predominantly agitated at night wandering around and hard to redirect.  She frequently takes off her oxygen.  Possibly some shortness of breath particularly when her oxygen is removed.  She also has had very poor p.o. intake for about a week.  Last night had some nausea and vomiting as well as this morning.  Denies fevers and chills.  Possibly having some abdominal pain.  Frequently saying she has to urinate.  Also may have had some blood in her urine yesterday.  Patient's daughter says she tends to drink a lot of water throughout the day.  She was brought in the emergency department.  Urinalysis was normal.  She had a sodium of 118 and potassium 5.5.  She was given [facility] and started on IV fluids.  Urine lites demonstrated osmolality of 170 and urine sodium of 29.  She had a similar hospitalization in July that mostly improved with IV fluids and was thought to be related to poor solute intake.   Medications:  Current Facility-Administered Medications  Medication Dose Route Frequency Provider Last Rate Last Admin   0.9 %  sodium chloride infusion   Intravenous Continuous Arnetha Courser, MD 100 mL/hr at 08/01/23 1000 New Bag at 08/01/23 1000   acetaminophen (TYLENOL) tablet 650 mg  650 mg Oral Q6H PRN Arnetha Courser, MD       Or   acetaminophen (TYLENOL) suppository 650 mg  650 mg Rectal Q6H PRN Arnetha Courser, MD  aspirin EC tablet 81 mg  81 mg Oral Daily Arnetha Courser, MD       enoxaparin (LOVENOX) injection 40 mg  40 mg Subcutaneous Q24H Amin, Tilman Neat, MD       fluticasone furoate-vilanterol (BREO ELLIPTA) 100-25 MCG/ACT 1  puff  1 puff Inhalation Daily Arnetha Courser, MD       And   umeclidinium bromide (INCRUSE ELLIPTA) 62.5 MCG/ACT 1 puff  1 puff Inhalation Daily Arnetha Courser, MD       isosorbide mononitrate (IMDUR) 24 hr tablet 60 mg  60 mg Oral Daily Arnetha Courser, MD       [START ON 08/02/2023] levothyroxine (SYNTHROID) tablet 100 mcg  100 mcg Oral Q0600 Arnetha Courser, MD       magnesium oxide (MAG-OX) tablet 200 mg  200 mg Oral QHS Arnetha Courser, MD       melatonin tablet 3 mg  3 mg Oral QHS Arnetha Courser, MD       metoprolol succinate (TOPROL-XL) 24 hr tablet 25 mg  25 mg Oral Daily Arnetha Courser, MD       mirtazapine (REMERON) tablet 15 mg  15 mg Oral QHS Arnetha Courser, MD       multivitamin with minerals tablet 1 tablet  1 tablet Oral Daily Arnetha Courser, MD       ondansetron (ZOFRAN) tablet 4 mg  4 mg Oral Q6H PRN Arnetha Courser, MD       Or   ondansetron (ZOFRAN) injection 4 mg  4 mg Intravenous Q6H PRN Arnetha Courser, MD       pantoprazole (PROTONIX) EC tablet 40 mg  40 mg Oral BID Arnetha Courser, MD       patiromer (VELTASSA) packet 8.4 g  8.4 g Oral Daily Arnetha Courser, MD       polyethylene glycol (MIRALAX / GLYCOLAX) packet 17 g  17 g Oral Daily PRN Arnetha Courser, MD       pravastatin (PRAVACHOL) tablet 40 mg  40 mg Oral QPM Arnetha Courser, MD       psyllium (HYDROCIL/METAMUCIL) 1 packet  1 packet Oral Daily Arnetha Courser, MD       senna (SENOKOT) tablet 8.6 mg  1 tablet Oral QHS Arnetha Courser, MD       Current Outpatient Medications  Medication Sig Dispense Refill   acetaminophen (TYLENOL) 325 MG tablet Take 1.5-3 tablets (487.5-975 mg total) by mouth every 6 (six) hours as needed for mild pain (or Fever >/= 101).     albuterol (VENTOLIN HFA) 108 (90 Base) MCG/ACT inhaler Inhale 1-2 puffs into the lungs every 4 (four) hours as needed for wheezing or shortness of breath.     aspirin EC 81 MG tablet Take 1 tablet (81 mg total) by mouth daily. Swallow whole. 30 tablet 2   Fluticasone-Umeclidin-Vilant  (TRELEGY ELLIPTA) 100-62.5-25 MCG/ACT AEPB Inhale 1 puff into the lungs daily. 28 each 0   isosorbide mononitrate (IMDUR) 60 MG 24 hr tablet Take 1 tablet (60 mg total) by mouth daily. 30 tablet 2   levothyroxine (SYNTHROID) 100 MCG tablet Take 1 tablet (100 mcg total) by mouth daily before breakfast. 30 tablet 1   magnesium oxide (MAG-OX) 400 MG tablet Take 0.5 tablets (200 mg total) by mouth at bedtime.     melatonin 3 MG TABS tablet Take 1 tablet (3 mg total) by mouth at bedtime. 60 tablet 0   metoprolol succinate (TOPROL-XL) 25 MG 24 hr tablet TAKE 1 TABLET DAILY 90 tablet 1  mirtazapine (REMERON) 15 MG tablet Take 1 tablet (15 mg total) by mouth at bedtime. 30 tablet 0   Multiple Vitamin (MULTI-VITAMIN) tablet Take 1 tablet by mouth daily.     pantoprazole (PROTONIX) 40 MG tablet Take 1 tablet (40 mg total) by mouth 2 (two) times daily. 60 tablet 1   pravastatin (PRAVACHOL) 40 MG tablet Take 1 tablet (40 mg total) by mouth every evening. 30 tablet 2   psyllium (HYDROCIL/METAMUCIL) 95 % PACK Take 1 packet by mouth daily. 240 each 0   senna (SENOKOT) 8.6 MG TABS tablet Take 1 tablet (8.6 mg total) by mouth at bedtime. 30 tablet 0   amiodarone (PACERONE) 200 MG tablet Take 1 po BID thru 9/17, then 1 po daily (Patient not taking: Reported on 08/01/2023) 30 tablet 1     ALLERGIES Meloxicam and Micardis hct [telmisartan-hctz]  MEDICAL HISTORY Past Medical History:  Diagnosis Date   AAA (abdominal aortic aneurysm) (HCC)    Adenocarcinoma of left lung (HCC) 2006   AF (paroxysmal atrial fibrillation) (HCC)    Aortic stenosis    Arthritis    Asthma    Cancer of breast, female (HCC)    Cancer of lung (HCC)    Dysrhythmia    Hypertension    Hypothyroidism    Invasive ductal carcinoma of left breast (HCC) 1999   Mitral regurgitation    Myocardial infarction (HCC)    Neutropenia (HCC) 06/09/2016   Personal history of radiation therapy    Pulmonary hypertension (HCC)      SOCIAL  HISTORY Social History   Socioeconomic History   Marital status: Widowed    Spouse name: Not on file   Number of children: Not on file   Years of education: Not on file   Highest education level: Not on file  Occupational History   Not on file  Tobacco Use   Smoking status: Former    Current packs/day: 0.00    Average packs/day: 0.8 packs/day for 35.0 years (26.3 ttl pk-yrs)    Types: Cigarettes    Start date: 22    Quit date: 65    Years since quitting: 31.7   Smokeless tobacco: Never   Tobacco comments:    smoke-free X 30 yeras  Vaping Use   Vaping status: Never Used  Substance and Sexual Activity   Alcohol use: No   Drug use: No   Sexual activity: Not Currently  Other Topics Concern   Not on file  Social History Narrative   Not on file   Social Determinants of Health   Financial Resource Strain: Not on file  Food Insecurity: No Food Insecurity (07/12/2023)   Hunger Vital Sign    Worried About Running Out of Food in the Last Year: Never true    Ran Out of Food in the Last Year: Never true  Transportation Needs: No Transportation Needs (07/12/2023)   PRAPARE - Administrator, Civil Service (Medical): No    Lack of Transportation (Non-Medical): No  Physical Activity: Inactive (12/12/2020)   Exercise Vital Sign    Days of Exercise per Week: 0 days    Minutes of Exercise per Session: 0 min  Stress: Not on file  Social Connections: Not on file  Intimate Partner Violence: Not At Risk (07/12/2023)   Humiliation, Afraid, Rape, and Kick questionnaire    Fear of Current or Ex-Partner: No    Emotionally Abused: No    Physically Abused: No    Sexually Abused: No  FAMILY HISTORY Family History  Problem Relation Age of Onset   Cancer Mother    Cancer Sister    Colon cancer Neg Hx       Review of Systems: 12 systems reviewed Otherwise as per HPI, all other systems reviewed and negative  Physical Exam: Vitals:   08/01/23 1200 08/01/23 1215  BP:  (!) 154/62 (!) 159/78  Pulse: 65 70  Resp: (!) 21   Temp: 97.8 F (36.6 C)   SpO2: 100% 93%   No intake/output data recorded.  Intake/Output Summary (Last 24 hours) at 08/01/2023 1238 Last data filed at 08/01/2023 0350 Gross per 24 hour  Intake --  Output 500 ml  Net -500 ml   General: Chronically ill-appearing, lying in bed, intermittently agitated moving around HEENT: anicteric sclera, oropharynx clear without lesions CV: regular rate, no lower extremity edema, no rub Lungs: clear to auscultation bilaterally, normal work of breathing Abd: soft, non-tender, non-distended Skin: no visible lesions or rashes Psych: alert, distractible, appropriate mood and affect Musculoskeletal: no obvious deformities Neuro: Confused, answers some questions appropriately, moves all 4 extremities  Test Results Reviewed Lab Results  Component Value Date   NA 121 (L) 08/01/2023   K 5.2 (H) 08/01/2023   CL 84 (L) 08/01/2023   CO2 23 08/01/2023   BUN 9 08/01/2023   CREATININE 0.95 08/01/2023   CALCIUM 8.5 (L) 08/01/2023   ALBUMIN 3.3 (L) 07/31/2023   PHOS 3.8 11/03/2022    CBC Recent Labs  Lab 07/31/23 2249 08/01/23 1005  WBC 2.8* 3.1*  NEUTROABS 0.2*  --   HGB 7.8* 7.6*  HCT 25.6* 25.0*  MCV 100.8* 99.2  PLT 118* 108*    I have reviewed all relevant outside healthcare records related to the patient's current hospitalization   The patient is critically ill with symptomatic hyponatremia and altered mental status and which includes my role to primarily manage symptomatic hyponatremia.  This requires high complexity decision making.  Total critical care time: 45 minutes    Critical care time was exclusive of treating other patients.   Critical care was necessary to treat or prevent imminent or life-threatening deterioration.   Critical care was time spent personally by me on the following activities:   development of treatment plan with patient and/or surrogate as well as  nursing,   discussions with other provider evaluation of patient's response to treatment  examination of patient  obtaining history from patient or surrogate  ordering and performing treatments and interventions  ordering and review of laboratory studies  ordering and review of radiographic studies

## 2023-08-01 NOTE — ED Notes (Signed)
Attempted to call floor prior to transport and no answer.

## 2023-08-01 NOTE — Assessment & Plan Note (Signed)
Potassium 5.5>>5.2 Patient initially received Lokelma -Veltassa daily -Monitor potassium

## 2023-08-01 NOTE — Assessment & Plan Note (Signed)
Check TSH. -Continue home Synthroid

## 2023-08-01 NOTE — Assessment & Plan Note (Signed)
Multifactorial with hyponatremia, multiple chronic comorbidities and malignancies. -PT/OT evaluation -Palliative care consult

## 2023-08-01 NOTE — Assessment & Plan Note (Deleted)
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 Currently being managed conservatively. Recent bone marrow biopsy with increase in blast from 10 to 15% Might not be a candidate for any active management based on advanced age per oncology note. -Palliative care consult -Smear review

## 2023-08-01 NOTE — ED Notes (Signed)
ED TO INPATIENT HANDOFF REPORT  ED Nurse Name and Phone #: Porfirio Mylar [phone]  S Name/Age/Gender Lisa [name] 89 y.o. female Room/Bed: 011C/011C  Code Status   Code Status: Limited: Do not attempt resuscitation (DNR) -DNR-LIMITED -Do Not Intubate/DNI   Home/SNF/Other Home Patient oriented to: self, place, time, and situation Is this baseline? Yes   Triage Complete: Triage complete  Chief Complaint Hyponatremia [E87.1]  Triage Note Patient's son reported shortness of breath and chest tightness  this evening with pain across abdomen and emesis / bilateral knee pain yesterday . Fatigue and poor oral intake .   Allergies Allergies  Allergen Reactions   Meloxicam Other (See Comments)    Caused an injury to the kidneys, per nephrologist   Micardis Hct [Telmisartan-Hctz] Other (See Comments)    Caused an injury to the kidneys, per nephrologist    Level of Care/Admitting Diagnosis ED Disposition     ED Disposition  Admit   Condition  --   Comment  Hospital Area: [facility] [[id number]]  Level of Care: Med-Surg [16]  May admit patient to [facility] or [facility] if equivalent level of care is available:: Yes  Covid Evaluation: Asymptomatic - no recent exposure (last 10 days) testing not required  Diagnosis: Hyponatremia [198519]  Admitting Physician: Arnetha Courser [[id number]]  Attending Physician: Arnetha Courser [phone]  Certification:: I certify this patient will need inpatient services for at least 2 midnights  Expected Medical Readiness: 08/03/2023          B Medical/Surgery History Past Medical History:  Diagnosis Date   AAA (abdominal aortic aneurysm) (HCC)    Adenocarcinoma of left lung (HCC) 2006   AF (paroxysmal atrial fibrillation) (HCC)    Aortic stenosis    Arthritis    Asthma    Cancer of breast, female (HCC)    Cancer of lung (HCC)    Dysrhythmia    Hypertension    Hypothyroidism    Invasive ductal carcinoma of left breast  (HCC) 1999   Mitral regurgitation    Myocardial infarction (HCC)    Neutropenia (HCC) 06/09/2016   Personal history of radiation therapy    Pulmonary hypertension (HCC)    Past Surgical History:  Procedure Laterality Date   BIOPSY  10/23/2020   Procedure: BIOPSY;  Surgeon: Benancio Deeds, MD;  Location: [facility] ENDOSCOPY;  Service: Gastroenterology;;   BREAST BIOPSY     left axillary node dissection   BREAST LUMPECTOMY Left    COLONOSCOPY  03/08/2003   NFA:OZHYQMVHQI rectal polyps destroyed with the tip of the snare/Polyps at hepatic flexure, splenic flexure at 35 cm/Left-sided diverticula: unable to retrieve path   COLONOSCOPY  09/12/2008   ONG:EXBMWU rectum and distal sigmoid diminutive polyps/scattered left sided diverticulum. hyperplastic   COLONOSCOPY N/A 03/06/2013   XLK:GMWNUUV polyp-removed as described above; colonic diverticulosis. hyperplastic polyps. next TCS 03/2018   ESOPHAGOGASTRODUODENOSCOPY (EGD) WITH PROPOFOL N/A 10/23/2020   Procedure: ESOPHAGOGASTRODUODENOSCOPY (EGD) WITH PROPOFOL;  Surgeon: Benancio Deeds, MD;  Location: [facility] ENDOSCOPY;  Service: Gastroenterology;  Laterality: N/A;   FOOT SURGERY     LUNG REMOVAL, PARTIAL     upper lobe     A IV Location/Drains/Wounds Patient Lines/Drains/Airways Status     Active Line/Drains/Airways     Name Placement date Placement time Site Days   Peripheral IV 07/31/23 20 G Right Antecubital 07/31/23  2344  Antecubital  1   Peripheral IV 08/01/23 20 G Distal;Left;Posterior Forearm 08/01/23  0105  Forearm  less than 1  Intake/Output Last 24 hours  Intake/Output Summary (Last 24 hours) at 08/01/2023 1200 Last data filed at 08/01/2023 0350 Gross per 24 hour  Intake --  Output 500 ml  Net -500 ml    Labs/Imaging Results for orders placed or performed during the hospital encounter of 07/31/23 (from the past 48 hour(s))  CBC with Differential     Status: Abnormal   Collection Time: 07/31/23 10:49 PM   Result Value Ref Range   WBC 2.8 (L) 4.0 - 10.5 K/uL    Comment: WHITE COUNT CONFIRMED ON SMEAR   RBC 2.54 (L) 3.87 - 5.11 MIL/uL   Hemoglobin 7.8 (L) 12.0 - 15.0 g/dL   HCT 16.1 (L) 09.6 - 04.5 %   MCV 100.8 (H) 80.0 - 100.0 fL   MCH 30.7 26.0 - 34.0 pg   MCHC 30.5 30.0 - 36.0 g/dL   RDW 40.9 (H) 81.1 - 91.4 %   Platelets 118 (L) 150 - 400 K/uL    Comment: REPEATED TO VERIFY   nRBC 19.3 (H) 0.0 - 0.2 %   Neutrophils Relative % 7 %   Neutro Abs 0.2 (LL) 1.7 - 7.7 K/uL    Comment: REPEATED TO VERIFY THIS CRITICAL RESULT HAS VERIFIED AND BEEN CALLED TO S. ROUTS RN BY MARY ALAMANO ON 09 28 2024 AT 2348, AND HAS BEEN READ BACK.     Lymphocytes Relative 50 %   Lymphs Abs 1.4 0.7 - 4.0 K/uL   Monocytes Relative 36 %   Monocytes Absolute 1.0 0.1 - 1.0 K/uL   Eosinophils Relative 0 %   Eosinophils Absolute 0.0 0.0 - 0.5 K/uL   Basophils Relative 0 %   Basophils Absolute 0.0 0.0 - 0.1 K/uL   WBC Morphology Moderate Left Shift (>5% metas and myelos)    Immature Granulocytes 7 %   Abs Immature Granulocytes 0.20 (H) 0.00 - 0.07 K/uL   Giant PLTs PRESENT     Comment: Performed at [facility], [address] [place], [place] [address]  Comprehensive metabolic panel     Status: Abnormal   Collection Time: 07/31/23 10:49 PM  Result Value Ref Range   Sodium 118 (LL) 135 - 145 mmol/L    Comment: ELECTROLYTES REPEATED TO VERIFY CRITICAL RESULT CALLED TO, READ BACK BY AND VERIFIED WITH S. ROCH, RN AT 23:41 09.28.24 JLASIGAN    Potassium 5.5 (H) 3.5 - 5.1 mmol/L   Chloride 83 (L) 98 - 111 mmol/L   CO2 23 22 - 32 mmol/L   Glucose, Bld 115 (H) 70 - 99 mg/dL    Comment: Glucose reference range applies only to samples taken after fasting for at least 8 hours.   BUN 11 8 - 23 mg/dL   Creatinine, Ser 6.21 0.44 - 1.00 mg/dL   Calcium 8.5 (L) 8.9 - 10.3 mg/dL   Total Protein 8.7 (H) 6.5 - 8.1 g/dL   Albumin 3.3 (L) 3.5 - 5.0 g/dL   AST 32 15 - 41 U/L   ALT 12 0 - 44 U/L   Alkaline  Phosphatase 45 38 - 126 U/L   Total Bilirubin 0.5 0.3 - 1.2 mg/dL   GFR, Estimated 57 (L) >60 mL/min    Comment: (NOTE) Calculated using the CKD-EPI Creatinine Equation (2021)    Anion gap 12 5 - 15    Comment: Performed at [facility], [address], [place], [place] [address]  Lipase, blood     Status: None   Collection Time: 07/31/23 10:49 PM  Result Value Ref Range   Lipase 49 11 - 51 U/L    Comment: Performed at [facility], [address] [place], [place] [address]  Troponin I (High Sensitivity)     Status: None   Collection Time: 07/31/23 10:49 PM  Result Value Ref Range   Troponin I (High Sensitivity) 14 <18 ng/L    Comment: (NOTE) Elevated high sensitivity troponin I (hsTnI) values and significant  changes across serial measurements may suggest ACS but many other  chronic and acute conditions are known to elevate hsTnI results.  Refer to the "Links" section for chest pain algorithms and additional  guidance. Performed at [facility], [address] [place], [place] [address]   Protime-INR     Status: Abnormal   Collection Time: 07/31/23 10:49 PM  Result Value Ref Range   Prothrombin Time 16.9 (H) 11.4 - 15.2 seconds   INR 1.4 (H) 0.8 - 1.2    Comment: (NOTE) INR goal varies based on device and disease states. Performed at [facility], [address] [place], [place] [address]   Brain natriuretic peptide     Status: Abnormal   Collection Time: 07/31/23 11:45 PM  Result Value Ref Range   B Natriuretic Peptide 1,637.4 (H) 0.0 - 100.0 pg/mL    Comment: Performed at [facility], [address] [place], [place] [address]  Resp panel by RT-PCR (RSV, Flu A&B, Covid) Anterior Nasal Swab     Status: None   Collection Time: 07/31/23 11:45 PM   Specimen: Anterior Nasal Swab  Result Value Ref Range   SARS Coronavirus 2 by RT PCR NEGATIVE NEGATIVE   Influenza A by PCR NEGATIVE NEGATIVE   Influenza B by PCR NEGATIVE NEGATIVE     Comment: (NOTE) The Xpert Xpress SARS-CoV-2/FLU/RSV plus assay is intended as an aid in the diagnosis of influenza from Nasopharyngeal swab specimens and should not be used as a sole basis for treatment. Nasal washings and aspirates are unacceptable for Xpert Xpress SARS-CoV-2/FLU/RSV testing.  Fact Sheet for Patients: BloggerCourse.com  Fact Sheet for Healthcare Providers: SeriousBroker.it  This test is not yet approved or cleared by the [place] FDA and has been authorized for detection and/or diagnosis of SARS-CoV-2 by FDA under an Emergency Use Authorization (EUA). This EUA will remain in effect (meaning this test can be used) for the duration of the COVID-19 declaration under Section 564(b)(1) of the Act, 21 U.S.C. section 360bbb-3(b)(1), unless the authorization is terminated or revoked.     Resp Syncytial Virus by PCR NEGATIVE NEGATIVE    Comment: (NOTE) Fact Sheet for Patients: BloggerCourse.com  Fact Sheet for Healthcare Providers: SeriousBroker.it  This test is not yet approved or cleared by the [place] FDA and has been authorized for detection and/or diagnosis of SARS-CoV-2 by FDA under an Emergency Use Authorization (EUA). This EUA will remain in effect (meaning this test can be used) for the duration of the COVID-19 declaration under Section 564(b)(1) of the Act, 21 U.S.C. section 360bbb-3(b)(1), unless the authorization is terminated or revoked.  Performed at [facility], [address], [place], [place] [address]   Urinalysis, Routine w reflex microscopic -Urine, Clean Catch     Status: Abnormal   Collection Time: 07/31/23 11:45 PM  Result Value Ref Range   Color, Urine STRAW (A) YELLOW   APPearance CLEAR CLEAR   Specific Gravity, Urine 1.011 1.005 - 1.030   pH 7.0 5.0 - 8.0  Glucose, UA NEGATIVE NEGATIVE mg/dL   Hgb urine dipstick  NEGATIVE NEGATIVE   Bilirubin Urine NEGATIVE NEGATIVE   Ketones, ur NEGATIVE NEGATIVE mg/dL   Protein, ur NEGATIVE NEGATIVE mg/dL   Nitrite NEGATIVE NEGATIVE   Leukocytes,Ua NEGATIVE NEGATIVE    Comment: Performed at [facility], [address], [place], [place] [address]  Blood culture (routine x 2)     Status: None (Preliminary result)   Collection Time: 07/31/23 11:52 PM   Specimen: BLOOD  Result Value Ref Range   Specimen Description BLOOD RIGHT ANTECUBITAL    Special Requests      BOTTLES DRAWN AEROBIC AND ANAEROBIC Blood Culture adequate volume   Culture      NO GROWTH < 12 HOURS Performed at [facility], [address], [place], [place] [address]    Report Status PENDING   Troponin I (High Sensitivity)     Status: None   Collection Time: 08/01/23 12:35 AM  Result Value Ref Range   Troponin I (High Sensitivity) 16 <18 ng/L    Comment: (NOTE) Elevated high sensitivity troponin I (hsTnI) values and significant  changes across serial measurements may suggest ACS but many other  chronic and acute conditions are known to elevate hsTnI results.  Refer to the "Links" section for chest pain algorithms and additional  guidance. Performed at [facility], [address], [place], [place] [address]   Blood culture (routine x 2)     Status: None (Preliminary result)   Collection Time: 08/01/23 12:49 AM   Specimen: BLOOD LEFT FOREARM  Result Value Ref Range   Specimen Description BLOOD LEFT FOREARM    Special Requests      BOTTLES DRAWN AEROBIC AND ANAEROBIC Blood Culture adequate volume   Culture      NO GROWTH < 12 HOURS Performed at [facility], [address], [place], [place] [address]    Report Status PENDING   Basic metabolic panel     Status: Abnormal   Collection Time: 08/01/23  7:29 AM  Result Value Ref Range   Sodium 118 (LL) 135 - 145 mmol/L    Comment: FIRST CALL ATTEMPT AT [id number]  SECOND ATTEMPT AT 0834 CRITICAL RESULT CALLED  TO, READ BACK BY AND VERIFIED WITH J BLU RN AT [id number] BY D LONG    Potassium 5.4 (H) 3.5 - 5.1 mmol/L   Chloride 87 (L) 98 - 111 mmol/L   CO2 21 (L) 22 - 32 mmol/L   Glucose, Bld 81 70 - 99 mg/dL    Comment: Glucose reference range applies only to samples taken after fasting for at least 8 hours.   BUN 10 8 - 23 mg/dL   Creatinine, Ser 8.46 0.44 - 1.00 mg/dL   Calcium 8.4 (L) 8.9 - 10.3 mg/dL   GFR, Estimated 55 (L) >60 mL/min    Comment: (NOTE) Calculated using the CKD-EPI Creatinine Equation (2021)    Anion gap 10 5 - 15    Comment: Performed at [facility], [address] [place], [place] [address]  Osmolality     Status: Abnormal   Collection Time: 08/01/23  7:29 AM  Result Value Ref Range   Osmolality 271 (L) 275 - 295 mOsm/kg    Comment: Performed at [facility], [address] [place], [place] [address]  Uric acid     Status: None   Collection Time: 08/01/23  7:29 AM  Result Value Ref Range  Uric Acid, Serum 6.1 2.5 - 7.1 mg/dL    Comment: HEMOLYSIS AT THIS LEVEL MAY AFFECT RESULT Performed at [facility], [address] [place], [place] [address]   Technologist smear review     Status: None   Collection Time: 08/01/23  7:29 AM  Result Value Ref Range   WBC MORPHOLOGY Moderate Left Shift (>5% metas and myelos)    RBC MORPHOLOGY POLYCHROMASIA PRESENT    Plt Morphology PLATELETS APPEAR DECREASED    Clinical Information      Hx CMML, increased NRBC reported on differential. Rule out blasts    Comment: Performed at [facility], [address] [place], [place] [address]  Lactate dehydrogenase     Status: Abnormal   Collection Time: 08/01/23  7:29 AM  Result Value Ref Range   LDH 392 (H) 98 - 192 U/L    Comment: Performed at [facility], [address], [place], [place] [address]  Protime-INR     Status: Abnormal   Collection Time: 08/01/23  7:29 AM  Result Value Ref Range   Prothrombin Time 16.8 (H) 11.4 - 15.2 seconds   INR 1.3  (H) 0.8 - 1.2    Comment: (NOTE) INR goal varies based on device and disease states. Performed at [facility], [address], [place], [place] [address]   Osmolality, urine     Status: Abnormal   Collection Time: 08/01/23  9:15 AM  Result Value Ref Range   Osmolality, Ur 170 (L) 300 - 900 mOsm/kg    Comment: Performed at [facility], [address] [place], [place] [address]  Sodium, urine, random     Status: None   Collection Time: 08/01/23  9:15 AM  Result Value Ref Range   Sodium, Ur 29 mmol/L    Comment: Performed at [facility], [address], [place], [place] [address]  CBC     Status: Abnormal   Collection Time: 08/01/23 10:05 AM  Result Value Ref Range   WBC 3.1 (L) 4.0 - 10.5 K/uL    Comment: WHITE COUNT CONFIRMED ON SMEAR   RBC 2.52 (L) 3.87 - 5.11 MIL/uL   Hemoglobin 7.6 (L) 12.0 - 15.0 g/dL   HCT 57.8 (L) 46.9 - 62.9 %   MCV 99.2 80.0 - 100.0 fL   MCH 30.2 26.0 - 34.0 pg   MCHC 30.4 30.0 - 36.0 g/dL   RDW 52.8 (H) 41.3 - 24.4 %   Platelets 108 (L) 150 - 400 K/uL    Comment: SPECIMEN CHECKED FOR CLOTS REPEATED TO VERIFY PLATELET COUNT CONFIRMED BY SMEAR    nRBC 15.4 (H) 0.0 - 0.2 %    Comment: Performed at [facility], [address], [place], [place] [address]  Creatinine, serum     Status: Abnormal   Collection Time: 08/01/23 10:05 AM  Result Value Ref Range   Creatinine, Ser 0.95 0.44 - 1.00 mg/dL   GFR, Estimated 58 (L) >60 mL/min    Comment: (NOTE) Calculated using the CKD-EPI Creatinine Equation (2021) Performed at [facility], [address] [place], [place] [address]    CT Angio Chest PE W and/or Wo Contrast  Result Date: 08/01/2023 CLINICAL DATA:  Shortness of breath and abdominal pain, initial encounter EXAM: CT ANGIOGRAPHY CHEST CT ABDOMEN AND PELVIS WITH CONTRAST TECHNIQUE: Multidetector CT imaging of the chest was performed using the standard protocol during bolus administration of intravenous contrast.  Multiplanar  CT image reconstructions and MIPs were obtained to evaluate the vascular anatomy. Multidetector CT imaging of the abdomen and pelvis was performed using the standard protocol during bolus administration of intravenous contrast. RADIATION DOSE REDUCTION: This exam was performed according to the departmental dose-optimization program which includes automated exposure control, adjustment of the mA and/or kV according to patient size and/or use of iterative reconstruction technique. CONTRAST:  75mL OMNIPAQUE IOHEXOL 350 MG/ML SOLN COMPARISON:  11/03/2022 FINDINGS: CTA CHEST FINDINGS Cardiovascular: Thoracic aorta demonstrates atherosclerotic calcifications. No aneurysmal dilatation is seen. Pulmonary artery shows a normal branching pattern bilaterally. The pulmonary artery shows no filling defect to suggest pulmonary embolism. Heart is enlarged in size. Heavy coronary calcifications are noted. Mediastinum/Nodes: Thoracic inlet is within normal limits. No hilar or mediastinal adenopathy is noted. The esophagus as visualized is within normal limits. Lungs/Pleura: Some mucous plugging is noted in the right lower lobe posteriorly. Generalized emphysematous changes are noted throughout both lungs. Changes consistent with prior left upper lobe wedge resection are seen. Patchy somewhat nodular appearing density is noted in the residual left upper lobe measuring up to 16 mm. This is new from the prior exam. This may represent inflammatory change although given the clinical history of prior lung carcinoma the possibility of recurrent disease could not be totally excluded. Musculoskeletal: No chest wall abnormality. No acute or significant osseous findings. Review of the MIP images confirms the above findings. CT ABDOMEN and PELVIS FINDINGS Hepatobiliary: No focal liver abnormality is seen. No gallstones, gallbladder wall thickening, or biliary dilatation. Pancreas: Unremarkable. No pancreatic ductal dilatation or  surrounding inflammatory changes. Spleen: Normal in size without focal abnormality. Adrenals/Urinary Tract: Adrenal glands are within normal limits. Kidneys demonstrate a normal enhancement pattern. No calculi are seen. No obstructive changes noted. The bladder is well distended. Stomach/Bowel: Scattered diverticular changes noted without evidence of diverticulitis. No obstructive or inflammatory changes are seen. Retained fecal material is noted throughout the colon consistent with a degree of constipation. The appendix is within normal limits. Small bowel and stomach are unremarkable. Vascular/Lymphatic: Atherosclerotic calcifications of the abdominal aorta are noted. Somewhat bilobed fusiform dilatation of the aorta is seen. Maximum dimension is 5.1 cm. No dissection is seen. Mural thrombus is noted. No specific venous abnormality is noted. No lymphadenopathy is seen. Reproductive: Uterus and bilateral adnexa are unremarkable. Other: No abdominal wall hernia or abnormality. No abdominopelvic ascites. Musculoskeletal: Degenerative changes of the lumbar spine are seen. Review of the MIP images confirms the above findings. IMPRESSION: CTA of the chest: No evidence of pulmonary embolism. Mucous plugging in the right lower lobe. 16 mm nodular density in the left upper lobe. Although this may be postinflammatory in nature. Given the patient's clinical history the possibility of recurrent neoplasm would deserve consideration. Short-term follow-up in 3 months is recommended. CT of the abdomen and pelvis: Abdominal aortic aneurysm measuring 5.1 cm. Recommend CTA or MRA, as appropriate, in 6 months and referral to a vascular specialist. Reference: Journal of Vascular Surgery 67.1 (2018): 2-77. J Am Coll Radiol [phone]. Diverticulosis without diverticulitis. Electronically Signed   By: Alcide Clever M.D.   On: 08/01/2023 01:52   CT ABDOMEN PELVIS W CONTRAST  Result Date: 08/01/2023 CLINICAL DATA:  Shortness of  breath and abdominal pain, initial encounter EXAM: CT ANGIOGRAPHY CHEST CT ABDOMEN AND PELVIS WITH CONTRAST TECHNIQUE: Multidetector CT imaging of the chest was performed using the standard protocol during bolus administration of intravenous contrast. Multiplanar CT image reconstructions and MIPs were obtained to evaluate the vascular  anatomy. Multidetector CT imaging of the abdomen and pelvis was performed using the standard protocol during bolus administration of intravenous contrast. RADIATION DOSE REDUCTION: This exam was performed according to the departmental dose-optimization program which includes automated exposure control, adjustment of the mA and/or kV according to patient size and/or use of iterative reconstruction technique. CONTRAST:  75mL OMNIPAQUE IOHEXOL 350 MG/ML SOLN COMPARISON:  11/03/2022 FINDINGS: CTA CHEST FINDINGS Cardiovascular: Thoracic aorta demonstrates atherosclerotic calcifications. No aneurysmal dilatation is seen. Pulmonary artery shows a normal branching pattern bilaterally. The pulmonary artery shows no filling defect to suggest pulmonary embolism. Heart is enlarged in size. Heavy coronary calcifications are noted. Mediastinum/Nodes: Thoracic inlet is within normal limits. No hilar or mediastinal adenopathy is noted. The esophagus as visualized is within normal limits. Lungs/Pleura: Some mucous plugging is noted in the right lower lobe posteriorly. Generalized emphysematous changes are noted throughout both lungs. Changes consistent with prior left upper lobe wedge resection are seen. Patchy somewhat nodular appearing density is noted in the residual left upper lobe measuring up to 16 mm. This is new from the prior exam. This may represent inflammatory change although given the clinical history of prior lung carcinoma the possibility of recurrent disease could not be totally excluded. Musculoskeletal: No chest wall abnormality. No acute or significant osseous findings. Review of the  MIP images confirms the above findings. CT ABDOMEN and PELVIS FINDINGS Hepatobiliary: No focal liver abnormality is seen. No gallstones, gallbladder wall thickening, or biliary dilatation. Pancreas: Unremarkable. No pancreatic ductal dilatation or surrounding inflammatory changes. Spleen: Normal in size without focal abnormality. Adrenals/Urinary Tract: Adrenal glands are within normal limits. Kidneys demonstrate a normal enhancement pattern. No calculi are seen. No obstructive changes noted. The bladder is well distended. Stomach/Bowel: Scattered diverticular changes noted without evidence of diverticulitis. No obstructive or inflammatory changes are seen. Retained fecal material is noted throughout the colon consistent with a degree of constipation. The appendix is within normal limits. Small bowel and stomach are unremarkable. Vascular/Lymphatic: Atherosclerotic calcifications of the abdominal aorta are noted. Somewhat bilobed fusiform dilatation of the aorta is seen. Maximum dimension is 5.1 cm. No dissection is seen. Mural thrombus is noted. No specific venous abnormality is noted. No lymphadenopathy is seen. Reproductive: Uterus and bilateral adnexa are unremarkable. Other: No abdominal wall hernia or abnormality. No abdominopelvic ascites. Musculoskeletal: Degenerative changes of the lumbar spine are seen. Review of the MIP images confirms the above findings. IMPRESSION: CTA of the chest: No evidence of pulmonary embolism. Mucous plugging in the right lower lobe. 16 mm nodular density in the left upper lobe. Although this may be postinflammatory in nature. Given the patient's clinical history the possibility of recurrent neoplasm would deserve consideration. Short-term follow-up in 3 months is recommended. CT of the abdomen and pelvis: Abdominal aortic aneurysm measuring 5.1 cm. Recommend CTA or MRA, as appropriate, in 6 months and referral to a vascular specialist. Reference: Journal of Vascular Surgery 67.1  (2018): 2-77. J Am Coll Radiol [phone]. Diverticulosis without diverticulitis. Electronically Signed   By: Alcide Clever M.D.   On: 08/01/2023 01:52   DG Chest 2 View  Result Date: 07/31/2023 CLINICAL DATA:  Shortness of breath. EXAM: CHEST - 2 VIEW COMPARISON:  Chest radiograph dated 07/12/2023. FINDINGS: Left lung base atelectasis or infiltrate. Significant improvement in the bilateral pulmonary opacities compared to prior radiograph. No pleural effusion or pneumothorax. Stable cardiac silhouette. No acute osseous pathology. IMPRESSION: Significant improvement in bibasilar opacities. Residual left lung base density may represent atelectasis or infiltrate. Electronically  Signed   By: Elgie Collard M.D.   On: 07/31/2023 23:11    Pending Labs Unresulted Labs (From admission, onward)     Start     Ordered   08/08/23 0500  Creatinine, serum  (enoxaparin (LOVENOX)    CrCl >/= 30 ml/min)  Weekly,   R     Comments: while on enoxaparin therapy    08/01/23 0930   08/02/23 0500  CBC with Differential/Platelet  Tomorrow morning,   R        08/01/23 0510   08/01/23 1200  Basic metabolic panel  Now then every 6 hours,   R (with TIMED occurrences)      08/01/23 0931   08/01/23 0729  Pathologist smear review  Once,   R        08/01/23 0729            Vitals/Pain Today's Vitals   08/01/23 0715 08/01/23 0959 08/01/23 1124 08/01/23 1126  BP:   (!) 186/82   Pulse: 73  68   Resp: (!) 24  20   Temp:  99.1 F (37.3 C)  97.8 F (36.6 C)  TempSrc:  Oral  Oral  SpO2: 95%  98%   PainSc:        Isolation Precautions Airborne and Enteric precautions (UV disinfection)  Medications Medications  enoxaparin (LOVENOX) injection 40 mg (0 mg Subcutaneous Hold 08/01/23 1111)  0.9 %  sodium chloride infusion ( Intravenous New Bag/Given 08/01/23 1000)  acetaminophen (TYLENOL) tablet 650 mg (has no administration in time range)    Or  acetaminophen (TYLENOL) suppository 650 mg (has no  administration in time range)  polyethylene glycol (MIRALAX / GLYCOLAX) packet 17 g (has no administration in time range)  ondansetron (ZOFRAN) tablet 4 mg (has no administration in time range)    Or  ondansetron (ZOFRAN) injection 4 mg (has no administration in time range)  aspirin EC tablet 81 mg (has no administration in time range)  fluticasone furoate-vilanterol (BREO ELLIPTA) 100-25 MCG/ACT 1 puff (has no administration in time range)    And  umeclidinium bromide (INCRUSE ELLIPTA) 62.5 MCG/ACT 1 puff (has no administration in time range)  isosorbide mononitrate (IMDUR) 24 hr tablet 60 mg (has no administration in time range)  levothyroxine (SYNTHROID) tablet 100 mcg (has no administration in time range)  magnesium oxide (MAG-OX) tablet 200 mg (has no administration in time range)  melatonin tablet 3 mg (has no administration in time range)  metoprolol succinate (TOPROL-XL) 24 hr tablet 25 mg (has no administration in time range)  mirtazapine (REMERON) tablet 15 mg (has no administration in time range)  Multi-Vitamin 1 tablet (has no administration in time range)  pantoprazole (PROTONIX) EC tablet 40 mg (has no administration in time range)  pravastatin (PRAVACHOL) tablet 40 mg (has no administration in time range)  senna (SENOKOT) tablet 8.6 mg (has no administration in time range)  psyllium (HYDROCIL/METAMUCIL) 1 packet (has no administration in time range)  ipratropium-albuterol (DUONEB) 0.5-2.5 (3) MG/3ML nebulizer solution 3 mL (3 mLs Nebulization Given 08/01/23 0050)  iohexol (OMNIPAQUE) 350 MG/ML injection 75 mL (75 mLs Intravenous Contrast Given 08/01/23 0136)  ipratropium-albuterol (DUONEB) 0.5-2.5 (3) MG/3ML nebulizer solution 3 mL (3 mLs Nebulization Given 08/01/23 0603)    Mobility non-ambulatory     Focused Assessments Pulmonary Assessment Handoff:  Lung sounds: Bilateral Breath Sounds: Expiratory wheezes O2 Device: Nasal Cannula O2 Flow Rate (L/min): 3  L/min    R Recommendations: See Admitting Provider Note  Report given  to:   Additional Notes: Patient's daughter is at bedside.

## 2023-08-01 NOTE — Assessment & Plan Note (Signed)
CT abdomen with abdominal aneurysm of 5.1 cm -Need 66-month follow-up -Outpatient vascular surgery evaluation

## 2023-08-02 ENCOUNTER — Encounter (HOSPITAL_COMMUNITY): Payer: Self-pay | Admitting: Internal Medicine

## 2023-08-02 DIAGNOSIS — D469 Myelodysplastic syndrome, unspecified: Secondary | ICD-10-CM

## 2023-08-02 DIAGNOSIS — R627 Adult failure to thrive: Secondary | ICD-10-CM | POA: Diagnosis not present

## 2023-08-02 DIAGNOSIS — Z515 Encounter for palliative care: Secondary | ICD-10-CM | POA: Diagnosis not present

## 2023-08-02 DIAGNOSIS — Z7189 Other specified counseling: Secondary | ICD-10-CM | POA: Diagnosis not present

## 2023-08-02 DIAGNOSIS — I480 Paroxysmal atrial fibrillation: Secondary | ICD-10-CM

## 2023-08-02 DIAGNOSIS — J9611 Chronic respiratory failure with hypoxia: Secondary | ICD-10-CM

## 2023-08-02 DIAGNOSIS — E871 Hypo-osmolality and hyponatremia: Secondary | ICD-10-CM

## 2023-08-02 DIAGNOSIS — I714 Abdominal aortic aneurysm, without rupture, unspecified: Secondary | ICD-10-CM

## 2023-08-02 DIAGNOSIS — E875 Hyperkalemia: Secondary | ICD-10-CM

## 2023-08-02 DIAGNOSIS — I5032 Chronic diastolic (congestive) heart failure: Secondary | ICD-10-CM

## 2023-08-02 DIAGNOSIS — N1831 Chronic kidney disease, stage 3a: Secondary | ICD-10-CM

## 2023-08-02 LAB — BASIC METABOLIC PANEL
Anion gap: 5 (ref 5–15)
Anion gap: 6 (ref 5–15)
Anion gap: 7 (ref 5–15)
Anion gap: 7 (ref 5–15)
Anion gap: 7 (ref 5–15)
Anion gap: 9 (ref 5–15)
BUN: 11 mg/dL (ref 8–23)
BUN: 11 mg/dL (ref 8–23)
BUN: 8 mg/dL (ref 8–23)
BUN: 8 mg/dL (ref 8–23)
BUN: 8 mg/dL (ref 8–23)
BUN: 9 mg/dL (ref 8–23)
CO2: 21 mmol/L — ABNORMAL LOW (ref 22–32)
CO2: 23 mmol/L (ref 22–32)
CO2: 24 mmol/L (ref 22–32)
CO2: 24 mmol/L (ref 22–32)
CO2: 25 mmol/L (ref 22–32)
CO2: 26 mmol/L (ref 22–32)
Calcium: 8 mg/dL — ABNORMAL LOW (ref 8.9–10.3)
Calcium: 8 mg/dL — ABNORMAL LOW (ref 8.9–10.3)
Calcium: 8.2 mg/dL — ABNORMAL LOW (ref 8.9–10.3)
Calcium: 8.2 mg/dL — ABNORMAL LOW (ref 8.9–10.3)
Calcium: 8.3 mg/dL — ABNORMAL LOW (ref 8.9–10.3)
Calcium: 8.4 mg/dL — ABNORMAL LOW (ref 8.9–10.3)
Chloride: 90 mmol/L — ABNORMAL LOW (ref 98–111)
Chloride: 90 mmol/L — ABNORMAL LOW (ref 98–111)
Chloride: 90 mmol/L — ABNORMAL LOW (ref 98–111)
Chloride: 91 mmol/L — ABNORMAL LOW (ref 98–111)
Chloride: 91 mmol/L — ABNORMAL LOW (ref 98–111)
Chloride: 91 mmol/L — ABNORMAL LOW (ref 98–111)
Creatinine, Ser: 1.03 mg/dL — ABNORMAL HIGH (ref 0.44–1.00)
Creatinine, Ser: 1.04 mg/dL — ABNORMAL HIGH (ref 0.44–1.00)
Creatinine, Ser: 1.13 mg/dL — ABNORMAL HIGH (ref 0.44–1.00)
Creatinine, Ser: 1.17 mg/dL — ABNORMAL HIGH (ref 0.44–1.00)
Creatinine, Ser: 1.33 mg/dL — ABNORMAL HIGH (ref 0.44–1.00)
Creatinine, Ser: 1.39 mg/dL — ABNORMAL HIGH (ref 0.44–1.00)
GFR, Estimated: 37 mL/min — ABNORMAL LOW (ref >60)
GFR, Estimated: 39 mL/min — ABNORMAL LOW (ref >60)
GFR, Estimated: 45 mL/min — ABNORMAL LOW (ref >60)
GFR, Estimated: 47 mL/min — ABNORMAL LOW (ref >60)
GFR, Estimated: 52 mL/min — ABNORMAL LOW (ref >60)
GFR, Estimated: 53 mL/min — ABNORMAL LOW (ref >60)
Glucose, Bld: 101 mg/dL — ABNORMAL HIGH (ref 70–99)
Glucose, Bld: 79 mg/dL (ref 70–99)
Glucose, Bld: 89 mg/dL (ref 70–99)
Glucose, Bld: 90 mg/dL (ref 70–99)
Glucose, Bld: 96 mg/dL (ref 70–99)
Glucose, Bld: 98 mg/dL (ref 70–99)
Potassium: 5 mmol/L (ref 3.5–5.1)
Potassium: 5 mmol/L (ref 3.5–5.1)
Potassium: 5.1 mmol/L (ref 3.5–5.1)
Potassium: 5.2 mmol/L — ABNORMAL HIGH (ref 3.5–5.1)
Potassium: 5.2 mmol/L — ABNORMAL HIGH (ref 3.5–5.1)
Potassium: 5.5 mmol/L — ABNORMAL HIGH (ref 3.5–5.1)
Sodium: 120 mmol/L — ABNORMAL LOW (ref 135–145)
Sodium: 120 mmol/L — ABNORMAL LOW (ref 135–145)
Sodium: 121 mmol/L — ABNORMAL LOW (ref 135–145)
Sodium: 121 mmol/L — ABNORMAL LOW (ref 135–145)
Sodium: 122 mmol/L — ABNORMAL LOW (ref 135–145)
Sodium: 123 mmol/L — ABNORMAL LOW (ref 135–145)

## 2023-08-02 LAB — CBC WITH DIFFERENTIAL/PLATELET
Abs Immature Granulocytes: 0.01 10*3/uL (ref 0.00–0.07)
Basophils Absolute: 0 10*3/uL (ref 0.0–0.1)
Basophils Relative: 0 %
Eosinophils Absolute: 0 10*3/uL (ref 0.0–0.5)
Eosinophils Relative: 0 %
HCT: 23.5 % — ABNORMAL LOW (ref 36.0–46.0)
Hemoglobin: 7 g/dL — ABNORMAL LOW (ref 12.0–15.0)
Immature Granulocytes: 0 %
Lymphocytes Relative: 36 %
Lymphs Abs: 1.2 10*3/uL (ref 0.7–4.0)
MCH: 30.3 pg (ref 26.0–34.0)
MCHC: 29.8 g/dL — ABNORMAL LOW (ref 30.0–36.0)
MCV: 101.7 fL — ABNORMAL HIGH (ref 80.0–100.0)
Monocytes Absolute: 2 10*3/uL — ABNORMAL HIGH (ref 0.1–1.0)
Monocytes Relative: 57 %
Neutro Abs: 0.2 10*3/uL — CL (ref 1.7–7.7)
Neutrophils Relative %: 7 %
Platelets: 119 10*3/uL — ABNORMAL LOW (ref 150–400)
RBC: 2.31 MIL/uL — ABNORMAL LOW (ref 3.87–5.11)
RDW: 19.2 % — ABNORMAL HIGH (ref 11.5–15.5)
WBC: 3.5 10*3/uL — ABNORMAL LOW (ref 4.0–10.5)
nRBC: 11.9 % — ABNORMAL HIGH (ref 0.0–0.2)

## 2023-08-02 LAB — GLUCOSE, CAPILLARY: Glucose-Capillary: 109 mg/dL — ABNORMAL HIGH (ref 70–99)

## 2023-08-02 LAB — PATHOLOGIST SMEAR REVIEW

## 2023-08-02 MED ORDER — ENSURE ENLIVE PO LIQD
237.0000 mL | Freq: Two times a day (BID) | ORAL | Status: DC
  Administered 2023-08-02: 237 mL via ORAL

## 2023-08-02 MED ORDER — SODIUM CHLORIDE 0.9 % IV SOLN: INTRAVENOUS | Status: DC

## 2023-08-02 NOTE — Subjective & Objective (Addendum)
Patient seen and examined.  Pleasantly demented.  No family at bedside. Pt sitting on edge of bed eating breakfast. Has drank all of her coffee. Encouraged her to eat her eggs and some bacon.  I called and gave pt's grandson Dr. Georgiana Shore an update on pt's condition/management on 08-02-2023

## 2023-08-02 NOTE — Progress Notes (Signed)
Palliative Medicine Inpatient Follow Up Note HPI: Lisa [name] is a 89 y.o. 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 intake for the past few days.    Palliative care has been asked to get involved to assist with additional goals of care conversations.   Today's Discussion 08/02/2023  *Please note that this is a verbal dictation therefore any spelling or grammatical errors are due to the "[employer]agon [employer] One" system interpretation.  Chart reviewed inclusive of vital signs, progress notes, laboratory results, and diagnostic images.   I met with Emiley at bedside this morning - she oriented to person and place. She had just worked with PT and was sitting in the chair. She was in good spirits and denies pain, shortness of breath, or nausea.   I met at bedside with patients daughter, Steward Drone later in the afternoon. She shares that she feels in some ways that her mother is being written off. I provided support through therapeutic listening. She shares that her fathers death was evident and it was very hard but she and her family got to where they needed to be mentally and emotionally prior. She does not feel we are in that space for [facility] at this time in that she has more good days ahead.  Steward Drone called her son, Frederik Schmidt on speaker-phone. We discussed patients Na level and poor oral intake. Frederik Schmidt is hopeful for an update from the hospitalist which I shared I would alert Dr. Imogene Burn to.   Goals remain for improvement at this time.   Questions and concerns addressed  I shared that we will continue to offer  palliative support and symptom management.   Objective Assessment: Vital Signs Vitals:   08/02/23 0851 08/02/23 1046  BP:  113/63  Pulse: 67 62  Resp: 18 19  Temp:  97.8 F (36.6 C)  SpO2: 94% 98%    Intake/Output Summary (Last 24 hours) at 08/02/2023 1439 Last data filed at 08/02/2023 0900 Gross per 24 hour  Intake 1200 ml  Output 100 ml  Net 1100 ml   Last Weight  Most recent update: 08/02/2023 10:51 AM    Weight  71.7 kg (158 lb)            Gen: Elderly AA F in NAD HEENT: moist mucous membranes CV: Regular rate and rhythm  PULM: On 2LPM [place], breathing is nonlabored  ABD: soft/nontender  EXT: No edema  Neuro: Alert and oriented x2-3  SUMMARY OF RECOMMENDATIONS   Full code at this time --> Patient and her family will review MOST form   Appreciate Chaplain assistance with AD's --> Prior AD's were not signed have provided patients daughter with a new copy    Continue to treat the treatable   Plan for medical optimization and patient to transition home   Ongoing PMT support   Symptom Management:  Adult FTT: - Nutrition consult is pending at this time - 1:1 feeding - Provide palatable options --> Patients daughter brought in foods from outside - On Remeron QHS   Weakness: - PT has seen --> able to mobilize well short distances   Nausea: - Continue Zofran as needed   Agitation: - Continue to ensure  strict delirium precautions  Billing based on MDM: High ______________________________________________________________________________________ [facility] Palliative Medicine Team Team Cell Phone: [phone] Please utilize secure chat with additional questions, if there is no response within 30 minutes please call the above phone number  Palliative Medicine Team providers are available by phone from 7am to 7pm daily and can be reached through the team cell phone.  Should this patient require assistance outside of these hours, please call  the patient's attending physician.

## 2023-08-02 NOTE — Progress Notes (Signed)
   Gave pt's grandson Dr. Georgiana Shore an update regarding pt's condition and management.  Carollee Herter, DO [facility]

## 2023-08-02 NOTE — Progress Notes (Signed)
Nurse to re-consult [facility]AST once patient is back in bed and ready for IV team to come assess for PIV placement. Tomasita Morrow, RN [facility]

## 2023-08-02 NOTE — Assessment & Plan Note (Signed)
Followed by oncology as an outpatient.

## 2023-08-02 NOTE — Assessment & Plan Note (Signed)
Followed by oncology as an outpatient. 08-03-2023 will transfuse 2 units PRBC today for HgB 6.9 g/dl

## 2023-08-02 NOTE — Evaluation (Signed)
Occupational Therapy Evaluation Patient Details Name: Lisa [name] MRN: [id number] DOB: May 03, 1934 Today's Date: 08/02/2023   History of Present Illness 89 y.o. female presents to [facility] hospital on 07/31/2023 with SOB, weakness and poor PO intake. Pt found to be hyponatremic. PMH includes chronic HFpEF, CKD III, PAH, lung adenocarcinoma, CML, moderate AS, AAA, NSTEMI, TIA, PAF.   Clinical Impression   Pt presents with decline in function and safety with ADLs and ADL mobility with impaired strength, balance and endurance. PTA pt lives at home with her daughter and required some assist with LB ADLs; pt was Ind with toileting, UB ADLs, grooming and self feeding, used RW for mobility. Pt reports that she drives, however uncertain how accurate this info is at this time. Pt currently requires min A with LB ADLs, min A with toileting, CGA with grooming standing at sink and CGA using RW for mobility/transfers. Pt would benefit from acute OT services to address impairments to maximize level of function and safety      If plan is discharge home, recommend the following: A little help with bathing/dressing/bathroom;A little help with walking and/or transfers;Assistance with cooking/housework;Assist for transportation    Functional Status Assessment  Patient has had a recent decline in their functional status and demonstrates the ability to make significant improvements in function in a reasonable and predictable amount of time.  Equipment Recommendations  None recommended by OT    Recommendations for Other Services       Precautions / Restrictions Precautions Precautions: Fall Precaution Comments: monitor SpO2 Restrictions Weight Bearing Restrictions: No      Mobility Bed Mobility               General bed mobility comments: pt in chair upon arrival    Transfers Overall transfer level: Needs assistance Equipment used: Rolling walker (2 wheels) Transfers: Sit to/from Stand Sit to  Stand: Contact guard assist                  Balance Overall balance assessment: Needs assistance Sitting-balance support: No upper extremity supported, Feet supported Sitting balance-Leahy Scale: Fair     Standing balance support: Single extremity supported, Reliant on assistive device for balance Standing balance-Leahy Scale: Poor                             ADL either performed or assessed with clinical judgement   ADL Overall ADL's : Needs assistance/impaired Eating/Feeding: Independent   Grooming: Wash/dry hands;Wash/dry face;Contact guard assist;Standing   Upper Body Bathing: Supervision/ safety Upper Body Bathing Details (indicate cue type and reason): simulated Lower Body Bathing: Minimal assistance Lower Body Bathing Details (indicate cue type and reason): simulated Upper Body Dressing : Supervision/safety   Lower Body Dressing: Minimal assistance Lower Body Dressing Details (indicate cue type and reason): socks Toilet Transfer: Contact guard assist;Ambulation;Rolling walker (2 wheels);Regular Toilet;Grab bars;BSC/3in1;Cueing for safety   Toileting- Clothing Manipulation and Hygiene: Minimal assistance;Sit to/from stand       Functional mobility during ADLs: Contact guard assist;Rolling walker (2 wheels);Cueing for safety       Vision Baseline Vision/History: 1 Wears glasses Ability to See in Adequate Light: 0 Adequate Patient Visual Report: No change from baseline       Perception         Praxis         Pertinent Vitals/Pain Pain Assessment Pain Assessment: No/denies pain     Extremity/Trunk Assessment Upper Extremity Assessment Upper Extremity Assessment:  Generalized weakness   Lower Extremity Assessment Lower Extremity Assessment: Defer to PT evaluation   Cervical / Trunk Assessment Cervical / Trunk Assessment: Kyphotic   Communication Communication Communication: Hearing impairment Cueing Techniques: Verbal cues    Cognition Arousal: Alert Behavior During Therapy: WFL for tasks assessed/performed Overall Cognitive Status: No family/caregiver present to determine baseline cognitive functioning                                       General Comments  pt desats on 2L Patrick with mobility to 87%, requires 3L [place]on to maintain sats in low 90s at rest and when mobilizing    Exercises     Shoulder Instructions      Home Living Family/patient expects to be discharged to:: Private residence Living Arrangements: Children Available Help at Discharge: Family;Available 24 hours/day Type of Home: House Home Access: Stairs to enter [employer] of Steps: Front stairs: 4-5 steps with R HR; Back stairs: 4-5 steps with R HR Entrance Stairs-Rails: Right Home Layout: Two level;Laundry or work area in basement;Able to live on main level with bedroom/bathroom Alternate Level Stairs-Number of Steps: 14- HR on R when descending, L when ascending   Bathroom Shower/Tub: Chief Strategy Officer: Handicapped height Bathroom Accessibility: Yes   Home Equipment: Agricultural consultant (2 wheels);Cane - quad;BSC/3in1;Grab bars - tub/shower;Other (comment)   Additional Comments: recent admission this month reports the pt having a wheelchair, pt denies having one at this time      Prior Functioning/Environment Prior Level of Function : Needs assist             Mobility Comments: ambulatory with RW or quad cane for short household distances. ADLs Comments: Per pt report, she needed intermittent assist for LB bathing and dressing. Able to toilet, groom, and feed on herself and that she was driving        OT Problem List: Decreased strength;Impaired balance (sitting and/or standing);Decreased activity tolerance;Decreased knowledge of use of DME or AE      OT Treatment/Interventions: Self-care/ADL training;DME and/or AE instruction;Therapeutic activities;Balance training;Patient/family  education    OT Goals(Current goals can be found in the care plan section) Acute Rehab OT Goals Patient Stated Goal: go home OT Goal Formulation: With patient Time For Goal Achievement: 08/16/23 Potential to Achieve Goals: Good ADL Goals Pt Will Perform Grooming: with supervision;with set-up;standing Pt Will Perform Upper Body Bathing: with set-up Pt Will Perform Lower Body Bathing: with contact guard assist;with supervision Pt Will Perform Upper Body Dressing: with set-up Pt Will Perform Lower Body Dressing: with contact guard assist;with supervision Pt Will Transfer to Toilet: with supervision;with modified independence;ambulating Pt Will Perform Toileting - Clothing Manipulation and hygiene: with contact guard assist;with supervision;sit to/from stand  OT Frequency: Min 1X/week    Co-evaluation              AM-PAC OT "6 Clicks" Daily Activity     Outcome Measure Help from another person eating meals?: None Help from another person taking care of personal grooming?: A Little Help from another person toileting, which includes using toliet, bedpan, or urinal?: A Little Help from another person bathing (including washing, rinsing, drying)?: A Little Help from another person to put on and taking off regular upper body clothing?: A Little Help from another person to put on and taking off regular lower body clothing?: A Little 6 Click Score: 19  End of Session Equipment Utilized During Treatment: Gait belt;Rolling walker (2 wheels)  Activity Tolerance: Patient tolerated treatment well Patient left: in chair;with call bell/phone within reach;with chair alarm set;with nursing/sitter in room  OT Visit Diagnosis: Unsteadiness on feet (R26.81);Other abnormalities of gait and mobility (R26.89);History of falling (Z91.81);Muscle weakness (generalized) (M62.81)                Time: 4403-4742 OT Time Calculation (min): 31 min Charges:  OT General Charges $OT Visit: 1 Visit OT  Evaluation $OT Eval Low Complexity: 1 Low OT Treatments $Therapeutic Activity: 8-22 mins    Galen Manila 08/02/2023, 11:20 AM

## 2023-08-02 NOTE — Assessment & Plan Note (Signed)
Going through chart, appears that patient has had a progressive decline.  Palliative care has seen the patient and family.  They are trying to decide on CODE STATUS and next steps.

## 2023-08-02 NOTE — Hospital Course (Addendum)
HPI: Lisa [name] is a 89 y.o. female with medical history significant of  chronic HFpEF, CKD stage IIIb, severe pulmonary hypertension, history of lung adenocarcinoma, MDS>> CML, chronic neutropenia, moderate AS/mild MR, AAA, NSTEMI, TIA, paroxysmal atrial fibrillation-not on anticoagulation, hypothyroidism, and COPD with chronic hypoxemia 2-3 L baseline oxygen use,  presented to the hospital with worsening shortness of breath, worsening weakness, and poor p.o. intake for the past few days.   Patient had multiple recent hospitalizations with hypoxic failure, sepsis and NSTEMI over the past couple of month. Recently discharged from [facility] on 07/15/2023.  Recent NSTEMI with recommendation to managed medically.   Per son patient with gradually declining health, over the past few days she was not eating and drinking well and progressively becoming more weak.  Patient was having some abdominal discomfort and stated that she noted mild hematuria at home, UA was negative for any blood here.   No recent upper respiratory infections, chest pain or shortness of breath.  No fever but do endorse some chills at home.  No nausea or vomiting.  Family and patient does not want very aggressive measures, only medical management.  Significant Events: Admitted 07/31/2023 admitted for hyponatremia and FTT   Significant Labs: Admitting Na 118, K 5.5, HgB 7.8, BNP 1637 08-02-2023 Na 121, BUN 8, Scr 1.13  Significant Imaging Studies: Admitting CTPA negative for PE. Showed mucus plugging RLL Admitting CT Abd shows Abdominal aortic aneurysm measuring 5.1 cm. Recommend CTA or MRA, as appropriate, in 6 months and referral to a vascular specialist.  Antibiotic Therapy: Anti-infectives (From admission, onward)    None       Procedures:   Consultants: Nephrology Palliative care

## 2023-08-02 NOTE — Progress Notes (Signed)
[facility] Progress Note  Name: Lisa [name] MRN: [id number] DOB: May 26, 1934  Chief Complaint:  Altered mental status/confusion   Subjective:  Strict ins/outs not available.  She has been on 3 liters oxygen.  Per RN her fluids were stopped this AM.   Review of systems:  She states her breathing is alright  Doesn't have an appetite  No n/v  Denies chest pain   ------------ Background on referral:  Lisa [name] is a/an 89 y.o. female with a past medical history of HFpEF, CKD, pulmonary hypertension, aortic stenosis, CAD, atrial fibrillation, CML, COPD who presents with confusion.  Because of the patient's confusion history was largely obtained from her daughter at the bedside.  As well as chart review.   Patient was discharged from the hospital on 9/12 after being hospitalized with shortness of breath and NSTEMI that was medically managed.  The daughter states that the patient has become more confused over the past 2 to 3 days.  She is predominantly agitated at night wandering around and hard to redirect.  She frequently takes off her oxygen.  Possibly some shortness of breath particularly when her oxygen is removed.  She also has had very poor p.o. intake for about a week.  Last night had some nausea and vomiting as well as this morning.  Denies fevers and chills.  Possibly having some abdominal pain.  Frequently saying she has to urinate.  Also may have had some blood in her urine yesterday.  Patient's daughter says she tends to drink a lot of water throughout the day.  She was brought in the emergency department.  Urinalysis was normal.  She had a sodium of 118 and potassium 5.5.  She was given [facility] and started on IV fluids.  Urine lytes demonstrated osmolality of 170 and urine sodium of 29.   She had a similar hospitalization in July that mostly improved with IV fluids and was thought to be related to poor solute intake.   Intake/Output Summary (Last 24 hours) at  08/02/2023 1321 Last data filed at 08/02/2023 0900 Gross per 24 hour  Intake 1200 ml  Output 100 ml  Net 1100 ml    Vitals:  Vitals:   08/02/23 0536 08/02/23 0851 08/02/23 1046 08/02/23 1050  BP: (!) 120/49  113/63   Pulse: 62 67 62   Resp: 16 18 19    Temp: 97.6 F (36.4 C)  97.8 F (36.6 C)   TempSrc:   Oral   SpO2: 94% 94% 98%   Weight:    71.7 kg  Height:    5\' 3"  (1.6 m)     Physical Exam:  General elderly female in bed in no acute distress HEENT normocephalic atraumatic extraocular movements intact sclera anicteric Neck supple trachea midline Lungs clear to auscultation bilaterally normal work of breathing at rest on 3 liters oxygen  Heart S1S2 no rub Abdomen soft nontender nondistended Extremities no edema  Psych normal mood and affect Neuro - alert and oriented to year, location, and person; follows commands   Medications reviewed   Labs:     Latest Ref Rng & Units 08/02/2023    9:19 AM 08/02/2023    4:33 AM 08/01/2023   11:50 PM  BMP  Glucose 70 - 99 mg/dL 89  79  90   BUN 8 - 23 mg/dL 8  8  8    Creatinine 0.44 - 1.00 mg/dL 2.44  0.10  2.72   Sodium 135 - 145 mmol/L 121  123  122   Potassium 3.5 - 5.1 mmol/L 5.1  5.5  5.0   Chloride 98 - 111 mmol/L 91  90  91   CO2 22 - 32 mmol/L 24  26  24    Calcium 8.9 - 10.3 mg/dL 8.3  8.4  8.2      Assessment/Plan:   Lisa [name] is a/an 89 y.o. female with a past medical history HFpEF, CKD, pulmonary hypertension, aortic stenosis, CAD, atrial fibrillation, CML, COPD who present w/ symptomatic hyponatremia    # Symptomatic hyponatremia: Based on urine lytes and history likely related to poor p.o. intake of solutes with concomitant overhydration.  Volume overload could also look similar but based on her exam this is unlikely.  Could have some underlying SIADH as well.    - I have stopped her remeron which may be contributing   - Fluids are currently off.  Would resume NS at 75  ml/hr  -Monitor BMP every 4 hours for  now   -Monitor neurological status closely   Hyperkalemia: Seems to be a recurrent issue.  Has improved somewhat with Lokelma already.  Changed to low potassium diet for now    Pulmonary hypertension/recent NSTEMI/CHF: Hydration as above.  Monitor respiratory status closely   Chronic hypoxic respiratory failure/COPD: Management per primary   Failure to thrive: This is the largest underlying issue.  Appreciate help from palliative care   Anemia: Multifactorial related to malignancy.  On home ESA; PRBC's per primary team    CML - per primary team   Would continue inpatient monitoring  [name], MD 08/02/2023 1:36 PM

## 2023-08-02 NOTE — Evaluation (Signed)
Physical Therapy Evaluation Patient Details Name: Lisa [name] MRN: [id number] DOB: 10/12/1934 Today's Date: 08/02/2023  History of Present Illness  89 y.o. female presents to [facility] hospital on 07/31/2023 with SOB, weakness and poor PO intake. Pt found to be hyponatremic. PMH includes chronic HFpEF, CKD III, PAH, lung adenocarcinoma, CML, moderate AS, AAA, NSTEMI, TIA, PAF.  Clinical Impression  Pt presents to PT with deficits in functional mobility, strength, power, balance, endurance. Pt is able to ambulate for short household distances with support of RW. Pt does not requires physical assistance to mobilize at this time, but benefits from PT assist for safety. Pt will benefit from frequent mobilization in an effort to improve stability and activity tolerance. PT recommends HHPT at the time of discharge.        If plan is discharge home, recommend the following: A little help with walking and/or transfers;A little help with bathing/dressing/bathroom;Assistance with cooking/housework;Direct supervision/assist for medications management;Direct supervision/assist for financial management;Help with stairs or ramp for entrance;Assist for transportation   Can travel by private vehicle        Equipment Recommendations None recommended by PT  Recommendations for Other Services       Functional Status Assessment Patient has had a recent decline in their functional status and demonstrates the ability to make significant improvements in function in a reasonable and predictable amount of time.     Precautions / Restrictions Precautions Precautions: Fall Precaution Comments: monitor SpO2 Restrictions Weight Bearing Restrictions: No      Mobility  Bed Mobility Overal bed mobility: Needs Assistance Bed Mobility: Supine to Sit     Supine to sit: Supervision          Transfers Overall transfer level: Needs assistance Equipment used: Rolling walker (2 wheels) Transfers: Sit to/from  Stand Sit to Stand: Contact guard assist                Ambulation/Gait Ambulation/Gait assistance: Contact guard assist Gait Distance (Feet): 80 Feet Assistive device: Rolling walker (2 wheels) Gait Pattern/deviations: Step-through pattern, Wide base of support Gait velocity: reduced Gait velocity interpretation: <1.8 ft/sec, indicate of risk for recurrent falls   General Gait Details: pt with slowed step-through gait, widened BOS and increased trunk flexion  Stairs            Wheelchair Mobility     Tilt Bed    Modified Rankin (Stroke Patients Only)       Balance Overall balance assessment: Needs assistance Sitting-balance support: No upper extremity supported, Feet supported Sitting balance-Leahy Scale: Fair     Standing balance support: Single extremity supported, Reliant on assistive device for balance Standing balance-Leahy Scale: Poor                               Pertinent Vitals/Pain Pain Assessment Pain Assessment: No/denies pain    Home Living Family/patient expects to be discharged to:: Private residence Living Arrangements: Children Available Help at Discharge: Family;Available 24 hours/day Type of Home: House Home Access: Stairs to enter Entrance Stairs-Rails: Right Entrance Stairs-Number of Steps: Front stairs: 4-5 steps with R HR; Back stairs: 4-5 steps with R HR   Home Layout: Two level;Laundry or work area in basement;Able to live on main level with bedroom/bathroom Home Equipment: Agricultural consultant (2 wheels);Cane - quad;BSC/3in1;Grab bars - tub/shower;Other (comment) Additional Comments: recent admission this month reports the pt having a wheelchair, pt denies having one at this time    Prior  Function Prior Level of Function : Needs assist             Mobility Comments: ambulatory with RW or quad cane for short household distances. ADLs Comments: Assited for bathing and dressing. Able to toilet, groom, and feed on  her own. IADL assist.     Extremity/Trunk Assessment   Upper Extremity Assessment Upper Extremity Assessment: Generalized weakness    Lower Extremity Assessment Lower Extremity Assessment: Generalized weakness    Cervical / Trunk Assessment Cervical / Trunk Assessment: Kyphotic  Communication   Communication Communication: Hearing impairment Cueing Techniques: Verbal cues  Cognition Arousal: Alert Behavior During Therapy: WFL for tasks assessed/performed Overall Cognitive Status: No family/caregiver present to determine baseline cognitive functioning                                 General Comments: pt provides conflicting information in chart compared to previous evaluation in september. Pt reports being on 2L [place] at home, despite 3L reported in last PT session 9/14. Pt also reports not owning a wheelchair.        General Comments General comments (skin integrity, edema, etc.): pt desats on 2L [place] with mobility to 87%, requires 3L [place] to maintain sats in low 90s at rest and when mobilizing    Exercises     Assessment/Plan    PT Assessment Patient needs continued PT services  PT Problem List Decreased strength;Decreased activity tolerance;Decreased balance;Decreased mobility;Decreased knowledge of use of DME;Decreased safety awareness;Decreased knowledge of precautions;Cardiopulmonary status limiting activity       PT Treatment Interventions DME instruction;Stair training;Gait training;Functional mobility training;Therapeutic activities;Therapeutic exercise;Balance training;Neuromuscular re-education;Patient/family education    PT Goals (Current goals can be found in the Care Plan section)  Acute Rehab PT Goals Patient Stated Goal: to improve activity tolerance PT Goal Formulation: With patient Time For Goal Achievement: 08/16/23 Potential to Achieve Goals: Fair    Frequency Min 1X/week     Co-evaluation               AM-PAC PT "6 Clicks"  Mobility  Outcome Measure Help needed turning from your back to your side while in a flat bed without using bedrails?: A Little Help needed moving from lying on your back to sitting on the side of a flat bed without using bedrails?: A Little Help needed moving to and from a bed to a chair (including a wheelchair)?: A Little Help needed standing up from a chair using your arms (e.g., wheelchair or bedside chair)?: A Little Help needed to walk in hospital room?: A Little Help needed climbing 3-5 steps with a railing? : A Lot 6 Click Score: 17    End of Session Equipment Utilized During Treatment: Oxygen Activity Tolerance: Patient limited by fatigue Patient left: in chair;with call bell/phone within reach;with chair alarm set Nurse Communication: Mobility status PT Visit Diagnosis: Other abnormalities of gait and mobility (R26.89);Muscle weakness (generalized) (M62.81)    Time: 6440-3474 PT Time Calculation (min) (ACUTE ONLY): 25 min   Charges:   PT Evaluation $PT Eval Low Complexity: 1 Low   PT General Charges $$ ACUTE PT VISIT: 1 Visit       Arlyss Gandy, PT, DPT Acute Rehabilitation Office [phone]   Arlyss Gandy 08/02/2023, 9:31 AM

## 2023-08-02 NOTE — Assessment & Plan Note (Signed)
Currently euvolemic. Not currently receiving any diuretics due to hyponatremia.

## 2023-08-02 NOTE — Progress Notes (Signed)
Initial Nutrition Assessment  DOCUMENTATION CODES:   Non-severe (moderate) malnutrition in context of chronic illness  INTERVENTION:  Ensure BID   NUTRITION DIAGNOSIS:   Moderate Malnutrition related to chronic illness as evidenced by moderate fat depletion, moderate muscle depletion, energy intake < or equal to 50% for > or equal to 1 month.    GOAL:   Patient will meet greater than or equal to 90% of their needs    MONITOR:   PO intake, Supplement acceptance, Labs, Weight trends  REASON FOR ASSESSMENT:   Consult Assessment of nutrition requirement/status  ASSESSMENT:     89 y.o. female Admitted with hyponatremia. Past medical history of HFpEF, CKD, pulmonary hypertension, aortic stenosis, CAD, atrial fibrillation, CML, COPD, Breast and lung cancer. Pt daughter reports more agitated at night,  Reported poor oral intake with N/V x 1 day. Patient hard of hearing RD had to repeat questions and speak loudly to pt. Setting up in chair. Stated she had had a loss of appetite of and on for ~ 1 month, Tried to eat Saturday but upchucked. Daughter lives with her and helps take care of her. She stated that her usual wt was 145#.   Meds; Mag-OX, TOPROL-XL, REMERON, MVM,  Labs; Na;123, K;5.5, Cl;90, Creat;1.17, Ca; 8.4,    Weight Hx; 08/02/23 71.7 kg  07/15/23 66.1 kg  07/06/23 67.5 kg  06/24/23 66.2 kg  06/11/23 73.2 kg  05/23/23 75.4 kg  04/06/23 67.1 kg  04/01/23 69 kg  12/18/22 63.5 kg  12/17/22 66 kg    NUTRITION - FOCUSED PHYSICAL EXAM:  Flowsheet Row Most Recent Value  Orbital Region Moderate depletion  Upper Arm Region Moderate depletion  Thoracic and Lumbar Region Moderate depletion  Buccal Region Moderate depletion  Temple Region Moderate depletion  Clavicle Bone Region Severe depletion  Clavicle and Acromion Bone Region Severe depletion  Scapular Bone Region Moderate depletion  Dorsal Hand Moderate depletion  Patellar Region Moderate depletion  Anterior  Thigh Region Moderate depletion  Posterior Calf Region Moderate depletion  Edema (RD Assessment) None  Hair Reviewed  Eyes Reviewed  Mouth Reviewed  Skin Reviewed  Nails Reviewed       Diet Order:   Diet Order             Diet regular Room service appropriate? Yes; Fluid consistency: Thin  Diet effective now                   EDUCATION NEEDS:   No education needs have been identified at this time  Skin:  Skin Assessment: Reviewed RN Assessment  Last BM:  Prior to admission  Height:   Ht Readings from Last 1 Encounters:  08/02/23 5\' 3"  (1.6 m)    Weight:   Wt Readings from Last 1 Encounters:  08/02/23 71.7 kg    Ideal Body Weight:  52 kg  BMI:  Body mass index is 27.99 kg/m.  Estimated Nutritional Needs:   Kcal:  1400-1830 kcal  Protein:  93-108 g  Fluid:  1800-2200 ml    Ricardo Jericho, RDN, LDN

## 2023-08-02 NOTE — Assessment & Plan Note (Signed)
Stable. Not currently exacerbated.

## 2023-08-02 NOTE — Progress Notes (Signed)
PROGRESS NOTE    Lisa [name]  WUJ:[id number] DOB: [birth year] DOA: 07/31/2023 PCP: [name], MD  Subjective: Patient seen and examined.  Pleasantly demented.  No family at bedside. Pt sitting on edge of bed eating breakfast. Has drank all of her coffee. Encouraged her to eat her eggs and some bacon.   Hospital Course: HPI: Lisa [name] is a 89 y.o. female with medical history significant of  chronic HFpEF, CKD stage IIIb, severe pulmonary hypertension, history of lung adenocarcinoma, MDS>> CML, chronic neutropenia, moderate AS/mild MR, AAA, NSTEMI, TIA, paroxysmal atrial fibrillation-not on anticoagulation, hypothyroidism, and COPD with chronic hypoxemia 2-3 L baseline oxygen use,  presented to the hospital with worsening shortness of breath, worsening weakness, and poor p.o. intake for the past few days.   Patient had multiple recent hospitalizations with hypoxic failure, sepsis and NSTEMI over the past couple of month. Recently discharged from [facility] on 07/15/2023.  Recent NSTEMI with recommendation to managed medically.   Per son patient with gradually declining health, over the past few days she was not eating and drinking well and progressively becoming more weak.  Patient was having some abdominal discomfort and stated that she noted mild hematuria at home, UA was negative for any blood here.   No recent upper respiratory infections, chest pain or shortness of breath.  No fever but do endorse some chills at home.  No nausea or vomiting.  Family and patient does not want very aggressive measures, only medical management.  Significant Events: Admitted 07/31/2023 admitted for hyponatremia and FTT   Significant Labs: Admitting Na 118, K 5.5, HgB 7.8, BNP 1637 08-02-2023 Na 121, BUN 8, Scr 1.13  Significant Imaging Studies: Admitting CTPA negative for PE. Showed mucus plugging RLL Admitting CT Abd shows Abdominal aortic aneurysm measuring 5.1 cm. Recommend CTA or MRA, as  appropriate, in 6 months and referral to a vascular specialist.  Antibiotic Therapy: Anti-infectives (From admission, onward)    None       Procedures:   Consultants: Nephrology Palliative care    Assessment and Plan: * Hyponatremia Patient has hypoosmolar hyponatremia.  Clinically appears dry with mildly elevated BNP.  Also concern of SIADH with underlying lung malignancy.  Likely contributory to her weakness Admitted to [facility] Nephrology has consulted. Giving some gentle IV fluid with normal saline Monitor sodium closely-if started trending down with IV fluid we will place her on salt tablets and water restriction.  Failure to thrive in adult Going through chart, appears that patient has had a progressive decline.  Palliative care has seen the patient and family.  They are trying to decide on CODE STATUS and next steps.  Pulmonary emphysema (HCC) Stable. Not currently exacerbated  CMML (chronic myelomonocytic leukemia) (HCC) -Being managed by outpatient oncology/hematology, according to oncology note high risk dysplastic CMML with hypomethylating agent. Currently being managed conservatively. [phone] bone marrow biopsy with increase in blast from 10 to 15% Might not be a candidate for any active management based on advanced age per oncology note. -Palliative care consult -awaiting Smear review  CAD (coronary artery disease) S/p recent NSTEMI(07-12-2023), which was managed medically. No chest pain and troponin currently negative -Continue home aspirin, statin, metoprolol and Imdur  Nonrheumatic aortic valve stenosis Being managed by outpatient cardiology  Hyperkalemia Potassium 5.5>>5.2 Patient initially received Lokelma -Veltassa daily -Monitor potassium  Pancytopenia (HCC) Seems chronic and is being managed by granulocyte and erythropoiesis stimulating agents.  White cell and platelets seems around baseline with decrease of hemoglobin. -08-01-2023 pt  given a  dose of Aranesp as recommended by oncology -Continue to monitor  Chronic diastolic CHF (congestive heart failure) (HCC) - LVEF 60-65% by echo [phone] Currently euvolemic. Not currently receiving any diuretics due to hyponatremia.  Myelodysplastic syndrome (HCC) Followed by oncology as an outpatient.  Paroxysmal atrial fibrillation (HCC) Not on any anticoagulation.  Amiodarone was recently discontinued. Currently in sinus rhythm. -Continue to monitor -Continue home metoprolol  Malnutrition of moderate degree -Dietitian consult  Generalized muscle weakness Multifactorial with hyponatremia, multiple chronic comorbidities and malignancies. -PT/OT evaluation -Palliative care consult  HTN (hypertension) Blood pressure mildly elevated. -Continue home metoprolol and Imdur  Hypothyroidism 08-01-2023 TSH 4.13 -Continue home Synthroid  Gout Uric acid currently normal. -Continue to monitor  Abdominal aortic aneurysm CT abdomen with abdominal aneurysm of 5.1 cm -Need 11-month follow-up -Outpatient vascular surgery evaluation  CKD stage 3a, GFR 45-59 ml/min (HCC) Appears to have CKD stage III A. -Creatinine at baseline -Monitor renal function -Avoid nephrotoxins  Chronic respiratory failure with hypoxia (HCC) No acute concern, on baseline oxygen use of 2 to 3 L. -Continue home bronchodilators  Adenocarcinoma of left lung (HCC) Followed by oncology as an outpatient.  Invasive ductal carcinoma of left breast (HCC) History of lung cancer History of CMML Patient with multiple malignancies.  LDH elevated -Being managed by oncology as outpatient -Patient gets regular colony-stimulating and erythropoietin stimulating agents by oncology -Palliative care consulted  DVT prophylaxis: enoxaparin (LOVENOX) injection 40 mg Start: 08/01/23 1400     Code Status: Full Code Family Communication: no family at bedside Disposition Plan: unknown. Home with hospice vs SNF Reason for  continuing need for hospitalization: monitoring Na  Objective: Vitals:   08/01/23 1656 08/01/23 1959 08/02/23 0536 08/02/23 0851  BP: (!) 157/67 129/64 (!) 120/49   Pulse: 68 61 62 67  Resp: 16 16 16 18   Temp: 98.2 F (36.8 C) (!) 97.5 F (36.4 C) 97.6 F (36.4 C)   TempSrc: Oral Oral    SpO2: 96% 100% 94% 94%  Weight:        Intake/Output Summary (Last 24 hours) at 08/02/2023 1008 Last data filed at 08/02/2023 0400 Gross per 24 hour  Intake 1000 ml  Output --  Net 1000 ml   Filed Weights   08/01/23 1259  Weight: 65.9 kg    Examination:  Physical Exam Vitals and nursing note reviewed.  Constitutional:      General: She is not in acute distress.    Appearance: She is not toxic-appearing or diaphoretic.     Comments: Pleasantly demented  HENT:     Head: Normocephalic and atraumatic.     Nose: Nose normal.  Eyes:     General: No scleral icterus. Cardiovascular:     Rate and Rhythm: Normal rate and regular rhythm.  Pulmonary:     Effort: Pulmonary effort is normal.     Breath sounds: Normal breath sounds.  Abdominal:     General: Bowel sounds are normal. There is no distension.     Tenderness: There is no abdominal tenderness.  Musculoskeletal:     Right lower leg: No edema.     Left lower leg: No edema.  Skin:    General: Skin is warm and dry.     Capillary Refill: Capillary refill takes less than 2 seconds.  Neurological:     Mental Status: She is alert. She is disoriented.     Data Reviewed: I have personally reviewed following labs and imaging studies  CBC: Recent Labs  Lab 07/31/23 2249 08/01/23 1005 08/02/23 0433  WBC 2.8* 3.1* 3.5*  NEUTROABS 0.2*  --  0.2*  HGB 7.8* 7.6* 7.0*  HCT 25.6* 25.0* 23.5*  MCV 100.8* 99.2 101.7*  PLT 118* 108* 119*   Basic Metabolic Panel: Recent Labs  Lab 08/01/23 1126 08/01/23 1853 08/01/23 2350 08/02/23 0433 08/02/23 0919  NA 121* 121* 122* 123* 121*  K 5.2* 4.9 5.0 5.5* 5.1  CL 84* 90* 91* 90* 91*   CO2 23 25 24 26 24   GLUCOSE 83 83 90 79 89  BUN 9 8 8 8 8   CREATININE 0.95 0.92 1.04* 1.17* 1.13*  CALCIUM 8.5* 8.2* 8.2* 8.4* 8.3*   GFR: Estimated Creatinine Clearance: 32 mL/min (A) (by C-G formula based on SCr of 1.13 mg/dL (H)). Liver Function Tests: Recent Labs  Lab 07/31/23 2249  AST 32  ALT 12  ALKPHOS 45  BILITOT 0.5  PROT 8.7*  ALBUMIN 3.3*   Recent Labs  Lab 07/31/23 2249  LIPASE 49    Coagulation Profile: Recent Labs  Lab 07/31/23 2249 08/01/23 0729  INR 1.4* 1.3*   CBG: Recent Labs  Lab 08/02/23 0748  GLUCAP 109*   Thyroid Function Tests: Recent Labs    08/01/23 1853  TSH 4.130    Recent Results (from the past 240 hour(s))  Resp panel by RT-PCR (RSV, Flu A&B, Covid) Anterior Nasal Swab     Status: None   Collection Time: 07/31/23 11:45 PM   Specimen: Anterior Nasal Swab  Result Value Ref Range Status   SARS Coronavirus 2 by RT PCR NEGATIVE NEGATIVE Final   Influenza A by PCR NEGATIVE NEGATIVE Final   Influenza B by PCR NEGATIVE NEGATIVE Final    Comment: (NOTE) The Xpert Xpress SARS-CoV-2/FLU/RSV plus assay is intended as an aid in the diagnosis of influenza from Nasopharyngeal swab specimens and should not be used as a sole basis for treatment. Nasal washings and aspirates are unacceptable for Xpert Xpress SARS-CoV-2/FLU/RSV testing.  Fact Sheet for Patients: BloggerCourse.com  Fact Sheet for Healthcare Providers: SeriousBroker.it  This test is not yet approved or cleared by the [place] FDA and has been authorized for detection and/or diagnosis of SARS-CoV-2 by FDA under an Emergency Use Authorization (EUA). This EUA will remain in effect (meaning this test can be used) for the duration of the COVID-19 declaration under Section 564(b)(1) of the Act, 21 U.S.C. section 360bbb-3(b)(1), unless the authorization is terminated or revoked.     Resp Syncytial Virus by PCR NEGATIVE  NEGATIVE Final    Comment: (NOTE) Fact Sheet for Patients: BloggerCourse.com  Fact Sheet for Healthcare Providers: SeriousBroker.it  This test is not yet approved or cleared by the [place] FDA and has been authorized for detection and/or diagnosis of SARS-CoV-2 by FDA under an Emergency Use Authorization (EUA). This EUA will remain in effect (meaning this test can be used) for the duration of the COVID-19 declaration under Section 564(b)(1) of the Act, 21 U.S.C. section 360bbb-3(b)(1), unless the authorization is terminated or revoked.  Performed at [facility], [address], [place], [place] [address]   Blood culture (routine x 2)     Status: None (Preliminary result)   Collection Time: 07/31/23 11:52 PM   Specimen: BLOOD  Result Value Ref Range Status   Specimen Description BLOOD RIGHT ANTECUBITAL  Final   Special Requests   Final    BOTTLES DRAWN AEROBIC AND ANAEROBIC Blood Culture adequate volume   Culture   Final  NO GROWTH 1 DAY Performed at [facility], [address] [place], [place] [address]    Report Status PENDING  Incomplete  Blood culture (routine x 2)     Status: None (Preliminary result)   Collection Time: 08/01/23 12:49 AM   Specimen: BLOOD LEFT FOREARM  Result Value Ref Range Status   Specimen Description BLOOD LEFT FOREARM  Final   Special Requests   Final    BOTTLES DRAWN AEROBIC AND ANAEROBIC Blood Culture adequate volume   Culture   Final    NO GROWTH 1 DAY Performed at [facility], [address], [place], [place] [address]    Report Status PENDING  Incomplete     Radiology Studies: CT Angio Chest PE W and/or Wo Contrast  Result Date: 08/01/2023 CLINICAL DATA:  Shortness of breath and abdominal pain, initial encounter EXAM: CT ANGIOGRAPHY CHEST CT ABDOMEN AND PELVIS WITH CONTRAST TECHNIQUE: Multidetector CT imaging of the chest was performed using the standard  protocol during bolus administration of intravenous contrast. Multiplanar CT image reconstructions and MIPs were obtained to evaluate the vascular anatomy. Multidetector CT imaging of the abdomen and pelvis was performed using the standard protocol during bolus administration of intravenous contrast. RADIATION DOSE REDUCTION: This exam was performed according to the departmental dose-optimization program which includes automated exposure control, adjustment of the mA and/or kV according to patient size and/or use of iterative reconstruction technique. CONTRAST:  75mL OMNIPAQUE IOHEXOL 350 MG/ML SOLN COMPARISON:  11/03/2022 FINDINGS: CTA CHEST FINDINGS Cardiovascular: Thoracic aorta demonstrates atherosclerotic calcifications. No aneurysmal dilatation is seen. Pulmonary artery shows a normal branching pattern bilaterally. The pulmonary artery shows no filling defect to suggest pulmonary embolism. Heart is enlarged in size. Heavy coronary calcifications are noted. Mediastinum/Nodes: Thoracic inlet is within normal limits. No hilar or mediastinal adenopathy is noted. The esophagus as visualized is within normal limits. Lungs/Pleura: Some mucous plugging is noted in the right lower lobe posteriorly. Generalized emphysematous changes are noted throughout both lungs. Changes consistent with prior left upper lobe wedge resection are seen. Patchy somewhat nodular appearing density is noted in the residual left upper lobe measuring up to 16 mm. This is new from the prior exam. This may represent inflammatory change although given the clinical history of prior lung carcinoma the possibility of recurrent disease could not be totally excluded. Musculoskeletal: No chest wall abnormality. No acute or significant osseous findings. Review of the MIP images confirms the above findings. CT ABDOMEN and PELVIS FINDINGS Hepatobiliary: No focal liver abnormality is seen. No gallstones, gallbladder wall thickening, or biliary dilatation.  Pancreas: Unremarkable. No pancreatic ductal dilatation or surrounding inflammatory changes. Spleen: Normal in size without focal abnormality. Adrenals/Urinary Tract: Adrenal glands are within normal limits. Kidneys demonstrate a normal enhancement pattern. No calculi are seen. No obstructive changes noted. The bladder is well distended. Stomach/Bowel: Scattered diverticular changes noted without evidence of diverticulitis. No obstructive or inflammatory changes are seen. Retained fecal material is noted throughout the colon consistent with a degree of constipation. The appendix is within normal limits. Small bowel and stomach are unremarkable. Vascular/Lymphatic: Atherosclerotic calcifications of the abdominal aorta are noted. Somewhat bilobed fusiform dilatation of the aorta is seen. Maximum dimension is 5.1 cm. No dissection is seen. Mural thrombus is noted. No specific venous abnormality is noted. No lymphadenopathy is seen. Reproductive: Uterus and bilateral adnexa are unremarkable. Other: No abdominal wall hernia or abnormality. No abdominopelvic ascites. Musculoskeletal: Degenerative changes of the lumbar spine are seen. Review of the  MIP images confirms the above findings. IMPRESSION: CTA of the chest: No evidence of pulmonary embolism. Mucous plugging in the right lower lobe. 16 mm nodular density in the left upper lobe. Although this may be postinflammatory in nature. Given the patient's clinical history the possibility of recurrent neoplasm would deserve consideration. Short-term follow-up in 3 months is recommended. CT of the abdomen and pelvis: Abdominal aortic aneurysm measuring 5.1 cm. Recommend CTA or MRA, as appropriate, in 6 months and referral to a vascular specialist. Reference: Journal of Vascular Surgery 67.1 (2018): 2-77. J Am Coll Radiol [id number]. Diverticulosis without diverticulitis. Electronically Signed   By: Alcide Clever M.D.   On: 08/01/2023 01:52   CT ABDOMEN PELVIS W  CONTRAST  Result Date: 08/01/2023 CLINICAL DATA:  Shortness of breath and abdominal pain, initial encounter EXAM: CT ANGIOGRAPHY CHEST CT ABDOMEN AND PELVIS WITH CONTRAST TECHNIQUE: Multidetector CT imaging of the chest was performed using the standard protocol during bolus administration of intravenous contrast. Multiplanar CT image reconstructions and MIPs were obtained to evaluate the vascular anatomy. Multidetector CT imaging of the abdomen and pelvis was performed using the standard protocol during bolus administration of intravenous contrast. RADIATION DOSE REDUCTION: This exam was performed according to the departmental dose-optimization program which includes automated exposure control, adjustment of the mA and/or kV according to patient size and/or use of iterative reconstruction technique. CONTRAST:  75mL OMNIPAQUE IOHEXOL 350 MG/ML SOLN COMPARISON:  11/03/2022 FINDINGS: CTA CHEST FINDINGS Cardiovascular: Thoracic aorta demonstrates atherosclerotic calcifications. No aneurysmal dilatation is seen. Pulmonary artery shows a normal branching pattern bilaterally. The pulmonary artery shows no filling defect to suggest pulmonary embolism. Heart is enlarged in size. Heavy coronary calcifications are noted. Mediastinum/Nodes: Thoracic inlet is within normal limits. No hilar or mediastinal adenopathy is noted. The esophagus as visualized is within normal limits. Lungs/Pleura: Some mucous plugging is noted in the right lower lobe posteriorly. Generalized emphysematous changes are noted throughout both lungs. Changes consistent with prior left upper lobe wedge resection are seen. Patchy somewhat nodular appearing density is noted in the residual left upper lobe measuring up to 16 mm. This is new from the prior exam. This may represent inflammatory change although given the clinical history of prior lung carcinoma the possibility of recurrent disease could not be totally excluded. Musculoskeletal: No chest wall  abnormality. No acute or significant osseous findings. Review of the MIP images confirms the above findings. CT ABDOMEN and PELVIS FINDINGS Hepatobiliary: No focal liver abnormality is seen. No gallstones, gallbladder wall thickening, or biliary dilatation. Pancreas: Unremarkable. No pancreatic ductal dilatation or surrounding inflammatory changes. Spleen: Normal in size without focal abnormality. Adrenals/Urinary Tract: Adrenal glands are within normal limits. Kidneys demonstrate a normal enhancement pattern. No calculi are seen. No obstructive changes noted. The bladder is well distended. Stomach/Bowel: Scattered diverticular changes noted without evidence of diverticulitis. No obstructive or inflammatory changes are seen. Retained fecal material is noted throughout the colon consistent with a degree of constipation. The appendix is within normal limits. Small bowel and stomach are unremarkable. Vascular/Lymphatic: Atherosclerotic calcifications of the abdominal aorta are noted. Somewhat bilobed fusiform dilatation of the aorta is seen. Maximum dimension is 5.1 cm. No dissection is seen. Mural thrombus is noted. No specific venous abnormality is noted. No lymphadenopathy is seen. Reproductive: Uterus and bilateral adnexa are unremarkable. Other: No abdominal wall hernia or abnormality. No abdominopelvic ascites. Musculoskeletal: Degenerative changes of the lumbar spine are seen. Review of the MIP images confirms the above findings. IMPRESSION: CTA of the chest:  No evidence of pulmonary embolism. Mucous plugging in the right lower lobe. 16 mm nodular density in the left upper lobe. Although this may be postinflammatory in nature. Given the patient's clinical history the possibility of recurrent neoplasm would deserve consideration. Short-term follow-up in 3 months is recommended. CT of the abdomen and pelvis: Abdominal aortic aneurysm measuring 5.1 cm. Recommend CTA or MRA, as appropriate, in 6 months and referral  to a vascular specialist. Reference: Journal of Vascular Surgery 67.1 (2018): 2-77. J Am Coll Radiol [phone]. Diverticulosis without diverticulitis. Electronically Signed   By: Alcide Clever M.D.   On: 08/01/2023 01:52   DG Chest 2 View  Result Date: 07/31/2023 CLINICAL DATA:  Shortness of breath. EXAM: CHEST - 2 VIEW COMPARISON:  Chest radiograph dated 07/12/2023. FINDINGS: Left lung base atelectasis or infiltrate. Significant improvement in the bilateral pulmonary opacities compared to prior radiograph. No pleural effusion or pneumothorax. Stable cardiac silhouette. No acute osseous pathology. IMPRESSION: Significant improvement in bibasilar opacities. Residual left lung base density may represent atelectasis or infiltrate. Electronically Signed   By: Elgie Collard M.D.   On: 07/31/2023 23:11    Scheduled Meds:  aspirin EC  81 mg Oral Daily   enoxaparin (LOVENOX) injection  40 mg Subcutaneous Q24H   fluticasone furoate-vilanterol  1 puff Inhalation Daily   And   umeclidinium bromide  1 puff Inhalation Daily   isosorbide mononitrate  60 mg Oral Daily   levothyroxine  100 mcg Oral Q0600   magnesium oxide  200 mg Oral QHS   melatonin  3 mg Oral QHS   metoprolol succinate  25 mg Oral Daily   mirtazapine  15 mg Oral QHS   multivitamin with minerals  1 tablet Oral Daily   pantoprazole  40 mg Oral BID   patiromer  8.4 g Oral Daily   pravastatin  40 mg Oral QPM   psyllium  1 packet Oral Daily   senna  1 tablet Oral QHS   Continuous Infusions:   LOS: 1 day   Time spent: 40 minutes  Carollee Herter, DO  [facility]  08/02/2023, 10:08 AM

## 2023-08-03 ENCOUNTER — Ambulatory Visit: Payer: Medicare Other | Admitting: Urology

## 2023-08-03 DIAGNOSIS — E871 Hypo-osmolality and hyponatremia: Secondary | ICD-10-CM | POA: Diagnosis not present

## 2023-08-03 DIAGNOSIS — R5381 Other malaise: Secondary | ICD-10-CM

## 2023-08-03 DIAGNOSIS — D469 Myelodysplastic syndrome, unspecified: Secondary | ICD-10-CM | POA: Diagnosis not present

## 2023-08-03 DIAGNOSIS — I5032 Chronic diastolic (congestive) heart failure: Secondary | ICD-10-CM | POA: Diagnosis not present

## 2023-08-03 DIAGNOSIS — R32 Unspecified urinary incontinence: Secondary | ICD-10-CM

## 2023-08-03 DIAGNOSIS — Z515 Encounter for palliative care: Secondary | ICD-10-CM | POA: Diagnosis not present

## 2023-08-03 DIAGNOSIS — R627 Adult failure to thrive: Secondary | ICD-10-CM | POA: Diagnosis not present

## 2023-08-03 LAB — GLUCOSE, CAPILLARY
Glucose-Capillary: 73 mg/dL (ref 70–99)
Glucose-Capillary: 83 mg/dL (ref 70–99)
Glucose-Capillary: 103 mg/dL — ABNORMAL HIGH (ref 70–99)

## 2023-08-03 LAB — BASIC METABOLIC PANEL
Anion gap: 5 (ref 5–15)
Anion gap: 5 (ref 5–15)
BUN: 12 mg/dL (ref 8–23)
BUN: 12 mg/dL (ref 8–23)
CO2: 23 mmol/L (ref 22–32)
CO2: 24 mmol/L (ref 22–32)
Calcium: 7.9 mg/dL — ABNORMAL LOW (ref 8.9–10.3)
Calcium: 8.1 mg/dL — ABNORMAL LOW (ref 8.9–10.3)
Chloride: 91 mmol/L — ABNORMAL LOW (ref 98–111)
Chloride: 91 mmol/L — ABNORMAL LOW (ref 98–111)
Creatinine, Ser: 1.31 mg/dL — ABNORMAL HIGH (ref 0.44–1.00)
Creatinine, Ser: 1.34 mg/dL — ABNORMAL HIGH (ref 0.44–1.00)
GFR, Estimated: 38 mL/min — ABNORMAL LOW (ref >60)
GFR, Estimated: 39 mL/min — ABNORMAL LOW (ref >60)
Glucose, Bld: 83 mg/dL (ref 70–99)
Glucose, Bld: 94 mg/dL (ref 70–99)
Potassium: 5.3 mmol/L — ABNORMAL HIGH (ref 3.5–5.1)
Potassium: 5.3 mmol/L — ABNORMAL HIGH (ref 3.5–5.1)
Sodium: 119 mmol/L — CL (ref 135–145)
Sodium: 120 mmol/L — ABNORMAL LOW (ref 135–145)

## 2023-08-03 LAB — CBC WITH DIFFERENTIAL/PLATELET
Abs Immature Granulocytes: 0.04 10*3/uL (ref 0.00–0.07)
Basophils Absolute: 0 10*3/uL (ref 0.0–0.1)
Basophils Relative: 0 %
Eosinophils Absolute: 0 10*3/uL (ref 0.0–0.5)
Eosinophils Relative: 0 %
HCT: 22.9 % — ABNORMAL LOW (ref 36.0–46.0)
Hemoglobin: 6.9 g/dL — CL (ref 12.0–15.0)
Immature Granulocytes: 1 %
Lymphocytes Relative: 38 %
Lymphs Abs: 1.4 10*3/uL (ref 0.7–4.0)
MCH: 30.3 pg (ref 26.0–34.0)
MCHC: 30.1 g/dL (ref 30.0–36.0)
MCV: 100.4 fL — ABNORMAL HIGH (ref 80.0–100.0)
Monocytes Absolute: 2.1 10*3/uL — ABNORMAL HIGH (ref 0.1–1.0)
Monocytes Relative: 56 %
Myelocytes: 2 %
Neutro Abs: 0.2 10*3/uL — CL (ref 1.7–7.7)
Neutrophils Relative %: 5 %
Platelets: 116 10*3/uL — ABNORMAL LOW (ref 150–400)
RBC: 2.28 MIL/uL — ABNORMAL LOW (ref 3.87–5.11)
RDW: 19.2 % — ABNORMAL HIGH (ref 11.5–15.5)
Smear Review: NORMAL
WBC: 3.7 10*3/uL — ABNORMAL LOW (ref 4.0–10.5)
nRBC: 10 /100{WBCs} — ABNORMAL HIGH
nRBC: 10.4 % — ABNORMAL HIGH (ref 0.0–0.2)

## 2023-08-03 LAB — PREPARE RBC (CROSSMATCH)

## 2023-08-03 LAB — SODIUM
Sodium: 122 mmol/L — ABNORMAL LOW (ref 135–145)
Sodium: 122 mmol/L — ABNORMAL LOW (ref 135–145)

## 2023-08-03 MED ORDER — NEPRO/CARBSTEADY PO LIQD
237.0000 mL | Freq: Two times a day (BID) | ORAL | Status: DC
  Administered 2023-08-03 – 2023-08-16 (×25): 237 mL via ORAL

## 2023-08-03 MED ORDER — TOLVAPTAN 15 MG PO TABS
7.5000 mg | ORAL_TABLET | Freq: Once | ORAL | Status: AC
  Administered 2023-08-03: 7.5 mg via ORAL
  Filled 2023-08-03: qty 1

## 2023-08-03 MED ORDER — ENOXAPARIN SODIUM 30 MG/0.3ML IJ SOSY
30.0000 mg | PREFILLED_SYRINGE | INTRAMUSCULAR | Status: DC
  Administered 2023-08-03 – 2023-08-09 (×7): 30 mg via SUBCUTANEOUS
  Filled 2023-08-03 (×8): qty 0.3

## 2023-08-03 MED ORDER — SODIUM CHLORIDE 0.9% IV SOLUTION: Freq: Once | INTRAVENOUS | Status: AC

## 2023-08-03 NOTE — Progress Notes (Addendum)
Palliative Medicine Inpatient Follow Up Note HPI: Lisa [name] is a 89 y.o. 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 intake for the past few days.    Palliative care has been asked to get involved to assist with additional goals of care conversations.   Today's Discussion 08/03/2023  *Please note that this is a verbal dictation therefore any spelling or grammatical errors are due to the "Dragon Medical One" system interpretation.  Chart reviewed inclusive of vital signs, progress notes, laboratory results, and diagnostic images. Sodium level at 119. Appetite has increased.   I met with Staci at bedside this morning - she is awake and oriented to person and place. She initially thought she was admitted to the hospital for a stroke though she was easily reoriented regarding reasoning for being here.   Tippi denies pain, shortness of breath, or nausea this morning. We discussed her Hgb/Hct and reviewed the reason why she is receiving a blood transfusion.  I called and spoke to Teleah's daughter, Steward Drone providing an update as above.   Questions and concerns addressed  I shared that we will continue to offer palliative support and symptom management.   Objective Assessment: Vital Signs Vitals:   08/03/23 1041 08/03/23 1115  BP: (!) 105/53 (!) 104/50  Pulse: 64 (!) 57  Resp: 16 16  Temp: 98 F (36.7 C) 98.2 F (36.8 C)  SpO2: 96% 98%    Intake/Output Summary (Last 24 hours) at 08/03/2023 1325 Last data filed at 08/03/2023 1030 Gross per 24 hour  Intake 485.85 ml  Output 0 ml  Net 485.85  ml   Last Weight  Most recent update: 08/03/2023  5:53 AM    Weight  73.5 kg (162 lb 0.6 oz)            Gen: Elderly AA F in NAD HEENT: moist mucous membranes CV: Regular rate and rhythm  PULM: On 2LPM [place], breathing is nonlabored  ABD: soft/nontender  EXT: No edema  Neuro: Alert and oriented x2-3  SUMMARY OF RECOMMENDATIONS   Full code at this time --> Patient and her family will review MOST form   Appreciate Chaplain assistance with AD's --> Prior AD's were not signed have provided patients daughter with a new copy    Continue to treat the treatable   Plan for medical optimization and patient to transition home   Ongoing PMT support   Symptom Management:  Adult FTT: - Nutrition involvement - 1:1 feeding - Provide palatable options --> Patients daughter brought in foods from outside - On [place] QHS   Weakness: - PT has seen --> able to mobilize well short distances   Nausea: - Continue Zofran as needed   Agitation: - Continue to ensure strict delirium precautions  Billing based on MDM: Moderate ______________________________________________________________________________________ Lamarr Lulas [place] Palliative Medicine Team Team Cell Phone: [phone] Please utilize secure chat with additional questions, if there is no response within 30 minutes please call the above phone number  Palliative Medicine Team providers are available by phone from 7am to 7pm daily and can be reached through the team cell phone.  Should this patient require assistance outside of these hours, please call the  patient's attending physician.

## 2023-08-03 NOTE — Progress Notes (Addendum)
PROGRESS NOTE    Lisa [name]  WGN:[id number] DOB: May 31, 1934 DOA: 07/31/2023 PCP: [name], MD  Subjective: Patient seen and examined.  Pleasantly demented.  No family at bedside. Pt sitting on edge of bed eating breakfast. Has drank all of her coffee. Encouraged her to eat her eggs and some bacon.  I called and gave pt's grandson Dr. Georgiana Shore an update on pt's condition/management on 08-02-2023   Hospital Course: HPI: Lisa [name] is a 89 y.o. female with medical history significant of  chronic HFpEF, CKD stage IIIb, severe pulmonary hypertension, history of lung adenocarcinoma, MDS>> CML, chronic neutropenia, moderate AS/mild MR, AAA, NSTEMI, TIA, paroxysmal atrial fibrillation-not on anticoagulation, hypothyroidism, and COPD with chronic hypoxemia 2-3 L baseline oxygen use,  presented to the hospital with worsening shortness of breath, worsening weakness, and poor p.o. intake for the past few days.   Patient had multiple recent hospitalizations with hypoxic failure, sepsis and NSTEMI over the past couple of month. Recently discharged from [facility] on 07/15/2023.  Recent NSTEMI with recommendation to managed medically.   Per son patient with gradually declining health, over the past few days she was not eating and drinking well and progressively becoming more weak.  Patient was having some abdominal discomfort and stated that she noted mild hematuria at home, UA was negative for any blood here.   No recent upper respiratory infections, chest pain or shortness of breath.  No fever but do endorse some chills at home.  No nausea or vomiting.  Family and patient does not want very aggressive measures, only medical management.  Significant Events: Admitted 07/31/2023 admitted for hyponatremia and FTT   Significant Labs: Admitting Na 118, K 5.5, HgB 7.8, BNP 1637 08-02-2023 Na 121, BUN 8, Scr 1.13  Significant Imaging Studies: Admitting CTPA negative for PE. Showed mucus  plugging RLL Admitting CT Abd shows Abdominal aortic aneurysm measuring 5.1 cm. Recommend CTA or MRA, as appropriate, in 6 months and referral to a vascular specialist.  Antibiotic Therapy: Anti-infectives (From admission, onward)    None       Procedures:   Consultants: Nephrology Palliative care    Assessment and Plan: * Hyponatremia Patient has hypoosmolar hyponatremia.  Clinically appears dry with mildly elevated BNP.  Also concern of SIADH with underlying lung malignancy.  Likely contributory to her weakness Admitted to [facility] Nephrology has consulted. 08-03-2023 sodium remains low. Today Na 119.  Failure to thrive in adult Going through chart, appears that patient has had a progressive decline.  Palliative care has seen the patient and family.  They are trying to decide on CODE STATUS and next steps.  Myelodysplastic syndrome (HCC) Followed by oncology as an outpatient. 08-03-2023 will transfuse 2 units PRBC today for HgB 6.9 g/dl  AKI (acute kidney injury) (HCC) Admission Scr 0.95 Today Scr 1.34 Was on IVF yesterday with NS.  Nephrology managing IVF/hyponatremia  Nausea and vomiting Stable. On zofran. Has not used any zofran  Pulmonary emphysema (HCC) Stable. Not currently exacerbated  CMML (chronic myelomonocytic leukemia) (HCC) -Being managed by outpatient oncology/hematology, according to oncology note high risk dysplastic CMML with hypomethylating agent. Currently being managed conservatively. [phone] bone marrow biopsy with increase in blast from 10 to 15% Might not be a candidate for any active management based on advanced age per oncology note. -Palliative care consult -Smear review shows "MACROCYTIC ANEMIA WITH ANISOCYTOSIS "  CAD (coronary artery disease) S/p recent NSTEMI(07-12-2023), which was managed medically. No chest pain and troponin currently negative -Continue  home aspirin, statin, metoprolol and Imdur  Nonrheumatic aortic valve  stenosis Being managed by outpatient cardiology  Hyperkalemia Admitting Potassium 5.5>>5.2 Patient initially received Lokelma -Veltassa daily -Monitor potassium -nephrology managing  Pancytopenia (HCC) Seems chronic and is being managed by granulocyte and erythropoiesis stimulating agents.  White cell and platelets seems around baseline with decrease of hemoglobin. -08-01-2023 pt given a dose of Aranesp as recommended by oncology -Continue to monitor  Chronic diastolic CHF (congestive heart failure) (HCC) - LVEF 60-65% by echo [phone] Currently euvolemic. Not currently receiving any diuretics due to hyponatremia.  Paroxysmal atrial fibrillation (HCC) Not on any anticoagulation.  Amiodarone was recently discontinued. Currently in sinus rhythm. -Continue to monitor -Continue home metoprolol  Malnutrition of moderate degree -Dietitian consult  Generalized muscle weakness Multifactorial with hyponatremia, multiple chronic comorbidities and malignancies. -PT/OT evaluation -Palliative care consult  HTN (hypertension) Blood pressure mildly elevated. -Continue home metoprolol and Imdur  Hypothyroidism 08-01-2023 TSH 4.13 -Continue home Synthroid  Gout Uric acid currently normal. -Continue to monitor  Abdominal aortic aneurysm CT abdomen with abdominal aneurysm of 5.1 cm -Need 41-month follow-up -Outpatient vascular surgery evaluation  CKD stage 3a, GFR 45-59 ml/min (HCC) Appears to have CKD stage III A. -Creatinine at baseline -Monitor renal function -Avoid nephrotoxins  Chronic respiratory failure with hypoxia (HCC) No acute concern, on baseline oxygen use of 2 to 3 L. -Continue home bronchodilators  Adenocarcinoma of left lung (HCC) Followed by oncology as an outpatient.  Invasive ductal carcinoma of left breast (HCC) History of lung cancer History of CMML Patient with multiple malignancies.  LDH elevated -Being managed by oncology as outpatient -Patient gets  regular colony-stimulating and erythropoietin stimulating agents by oncology -Palliative care consulted   DVT prophylaxis: enoxaparin (LOVENOX) injection 30 mg Start: 08/03/23 1400    Code Status: Full Code Family Communication: no family at bedside. Did call pt's grandson Dr. Georgiana Shore on 08-02-2023. See progress note. Disposition Plan: home Reason for continuing need for hospitalization: serum Na remains low. Continued management by nephrology.  Objective: Vitals:   08/03/23 0841 08/03/23 1030 08/03/23 1041 08/03/23 1115  BP: (!) 111/54 (!) 105/53 (!) 105/53 (!) 104/50  Pulse: 62 64 64 (!) 57  Resp: 18 16 16 16   Temp: 98.6 F (37 C) 98 F (36.7 C) 98 F (36.7 C) 98.2 F (36.8 C)  TempSrc: Oral Oral Oral Oral  SpO2: 97% 96% 96% 98%  Weight:      Height:        Intake/Output Summary (Last 24 hours) at 08/03/2023 1123 Last data filed at 08/03/2023 1030 Gross per 24 hour  Intake 485.85 ml  Output 0 ml  Net 485.85 ml   Filed Weights   08/01/23 1259 08/02/23 1050 08/03/23 0500  Weight: 65.9 kg 71.7 kg 73.5 kg    Examination:  Physical Exam Vitals and nursing note reviewed.  Constitutional:      General: She is not in acute distress.    Appearance: She is not toxic-appearing or diaphoretic.     Comments: Pleasantly demented  HENT:     Head: Normocephalic and atraumatic.     Nose: Nose normal.  Eyes:     General: No scleral icterus. Cardiovascular:     Rate and Rhythm: Normal rate and regular rhythm.  Pulmonary:     Effort: Pulmonary effort is normal.     Breath sounds: Normal breath sounds.  Abdominal:     General: Bowel sounds are normal. There is no distension.     Tenderness:  There is no abdominal tenderness.  Musculoskeletal:     Right lower leg: No edema.     Left lower leg: No edema.  Skin:    General: Skin is warm and dry.     Capillary Refill: Capillary refill takes less than 2 seconds.  Neurological:     Mental Status: She is alert. She is  disoriented.     Data Reviewed: I have personally reviewed following labs and imaging studies  CBC: Recent Labs  Lab 07/31/23 2249 08/01/23 1005 08/02/23 0433 08/03/23 0443  WBC 2.8* 3.1* 3.5* 3.7*  NEUTROABS 0.2*  --  0.2* 0.2*  HGB 7.8* 7.6* 7.0* 6.9*  HCT 25.6* 25.0* 23.5* 22.9*  MCV 100.8* 99.2 101.7* 100.4*  PLT 118* 108* 119* 116*   Basic Metabolic Panel: Recent Labs  Lab 08/02/23 1229 08/02/23 1856 08/02/23 1948 08/02/23 2337 08/03/23 0443  NA 121* 120* 120* 120* 119*  K 5.2* 5.2* 5.0 5.3* 5.3*  CL 91* 90* 90* 91* 91*  CO2 25 21* 23 24 23   GLUCOSE 96 98 101* 94 83  BUN 9 11 11 12 12   CREATININE 1.03* 1.33* 1.39* 1.31* 1.34*  CALCIUM 8.2* 8.0* 8.0* 8.1* 7.9*   GFR: Estimated Creatinine Clearance: 28.4 mL/min (A) (by C-G formula based on SCr of 1.34 mg/dL (H)). Liver Function Tests: Recent Labs  Lab 07/31/23 2249  AST 32  ALT 12  ALKPHOS 45  BILITOT 0.5  PROT 8.7*  ALBUMIN 3.3*   Recent Labs  Lab 07/31/23 2249  LIPASE 49    Coagulation Profile: Recent Labs  Lab 07/31/23 2249 08/01/23 0729  INR 1.4* 1.3*   CBG: Recent Labs  Lab 08/02/23 0748 08/03/23 0719 08/03/23 1116  GLUCAP 109* 73 83   Thyroid Function Tests: Recent Labs    08/01/23 1853  TSH 4.130    Recent Results (from the past 240 hour(s))  Resp panel by RT-PCR (RSV, Flu A&B, Covid) Anterior Nasal Swab     Status: None   Collection Time: 07/31/23 11:45 PM   Specimen: Anterior Nasal Swab  Result Value Ref Range Status   SARS Coronavirus 2 by RT PCR NEGATIVE NEGATIVE Final   Influenza A by PCR NEGATIVE NEGATIVE Final   Influenza B by PCR NEGATIVE NEGATIVE Final    Comment: (NOTE) The Xpert Xpress SARS-CoV-2/FLU/RSV plus assay is intended as an aid in the diagnosis of influenza from Nasopharyngeal swab specimens and should not be used as a sole basis for treatment. Nasal washings and aspirates are unacceptable for Xpert Xpress SARS-CoV-2/FLU/RSV testing.  Fact Sheet  for Patients: BloggerCourse.com  Fact Sheet for Healthcare Providers: SeriousBroker.it  This test is not yet approved or cleared by the [place] FDA and has been authorized for detection and/or diagnosis of SARS-CoV-2 by FDA under an Emergency Use Authorization (EUA). This EUA will remain in effect (meaning this test can be used) for the duration of the COVID-19 declaration under Section 564(b)(1) of the Act, 21 U.S.C. section 360bbb-3(b)(1), unless the authorization is terminated or revoked.     Resp Syncytial Virus by PCR NEGATIVE NEGATIVE Final    Comment: (NOTE) Fact Sheet for Patients: BloggerCourse.com  Fact Sheet for Healthcare Providers: SeriousBroker.it  This test is not yet approved or cleared by the [place] FDA and has been authorized for detection and/or diagnosis of SARS-CoV-2 by FDA under an Emergency Use Authorization (EUA). This EUA will remain in effect (meaning this test can be used) for the duration of the COVID-19 declaration under Section  564(b)(1) of the Act, 21 U.S.C. section 360bbb-3(b)(1), unless the authorization is terminated or revoked.  Performed at [facility], [address], [place], [place] [address]   Blood culture (routine x 2)     Status: None (Preliminary result)   Collection Time: 07/31/23 11:52 PM   Specimen: BLOOD  Result Value Ref Range Status   Specimen Description BLOOD RIGHT ANTECUBITAL  Final   Special Requests   Final    BOTTLES DRAWN AEROBIC AND ANAEROBIC Blood Culture adequate volume   Culture   Final    NO GROWTH 2 DAYS Performed at [facility], [address], [place], [place] [address]    Report Status PENDING  Incomplete  Blood culture (routine x 2)     Status: None (Preliminary result)   Collection Time: 08/01/23 12:49 AM   Specimen: BLOOD LEFT FOREARM  Result Value Ref Range Status   Specimen  Description BLOOD LEFT FOREARM  Final   Special Requests   Final    BOTTLES DRAWN AEROBIC AND ANAEROBIC Blood Culture adequate volume   Culture   Final    NO GROWTH 2 DAYS Performed at [facility], [address] [place], [place] [address]    Report Status PENDING  Incomplete     Radiology Studies: No results found.  Scheduled Meds:  aspirin EC  81 mg Oral Daily   enoxaparin (LOVENOX) injection  30 mg Subcutaneous Q24H   feeding supplement  237 mL Oral BID BM   fluticasone furoate-vilanterol  1 puff Inhalation Daily   And   umeclidinium bromide  1 puff Inhalation Daily   isosorbide mononitrate  60 mg Oral Daily   levothyroxine  100 mcg Oral Q0600   magnesium oxide  200 mg Oral QHS   melatonin  3 mg Oral QHS   metoprolol succinate  25 mg Oral Daily   multivitamin with minerals  1 tablet Oral Daily   patiromer  8.4 g Oral Daily   pravastatin  40 mg Oral QPM   psyllium  1 packet Oral Daily   senna  1 tablet Oral QHS   Continuous Infusions:   LOS: 2 days   Time spent: 35 minutes  Carollee Herter, DO  [facility]  08/03/2023, 11:23 AM

## 2023-08-03 NOTE — Progress Notes (Signed)
Physical Therapy Treatment Patient Details Name: Lisa [name] MRN: [id number] DOB: [birth year] Today's Date: 08/03/2023   History of Present Illness 89 y.o. female presents to [facility] hospital on 07/31/2023 with SOB, weakness and poor PO intake. Pt found to be hyponatremic. Pt received 2 units PRBC on 10/1 AM. PMH includes chronic HFpEF, CKD III, PAH, lung adenocarcinoma, CML, moderate AS, AAA, NSTEMI, TIA, PAF.    PT Comments  Pt received in supine, hypoxic on RA and SpO2 improved once placed back on 2L O2 [place]. Pt agreeable to transfer/gait training with RW. Pt quick to fatigue after ~72ft and moderate to severe dyspnea on exertion observed, pt SpO2 WFL on 2L O2 [place]. BP drop from sit to supine with HOB elevated, not able to assess standing BP due to pt fatigue and c/o dizziness while standing not able to tolerate standing long enough to check. Pt needing up to minA for transfers/gait. Pt continues to benefit from PT services to progress toward functional mobility goals.    If plan is discharge home, recommend the following: A little help with walking and/or transfers;A little help with bathing/dressing/bathroom;Assistance with cooking/housework;Direct supervision/assist for medications management;Direct supervision/assist for financial management;Help with stairs or ramp for entrance;Assist for transportation   Can travel by private vehicle        Equipment Recommendations  None recommended by PT    Recommendations for Other Services       Precautions / Restrictions Precautions Precautions: Fall Precaution Comments: monitor SpO2 Restrictions Weight Bearing Restrictions: No     Mobility  Bed Mobility Overal bed mobility: Needs Assistance Bed Mobility: Supine to Sit, Sit to Supine     Supine to sit: Contact guard Sit to supine: Supervision   General bed mobility comments: Increased time/cues to initiate and perform tasks.    Transfers Overall transfer level: Needs  assistance Equipment used: Rolling walker (2 wheels) Transfers: Sit to/from Stand Sit to Stand: Contact guard assist, Min assist           General transfer comment: minA to rise, CGA for safety with stand>sit, cues for UE placement. Increased effort to perform.    Ambulation/Gait Ambulation/Gait assistance: Min assist Gait Distance (Feet): 30 Feet Assistive device: Rolling walker (2 wheels) Gait Pattern/deviations: Step-through pattern, Wide base of support, Drifts right/left       General Gait Details: pt with slowed step-through gait, widened BOS and increased trunk flexion. Instability as she fatigued needing up to minA for stability, also with turning. DOE 3/4, SpO2 WFL on 2L O2 [place] pre/post and 2L kept on throughout via [place].   Stairs             Wheelchair Mobility     Tilt Bed    Modified Rankin (Stroke Patients Only)       Balance Overall balance assessment: Needs assistance Sitting-balance support: No upper extremity supported, Feet supported Sitting balance-Leahy Scale: Fair     Standing balance support: Reliant on assistive device for balance, Bilateral upper extremity supported Standing balance-Leahy Scale: Poor Standing balance comment: Fair static standing with RW, Poor as she fatigues with RW                            Cognition Arousal: Alert Behavior During Therapy: WFL for tasks assessed/performed Overall Cognitive Status: History of cognitive impairments - at baseline  General Comments: Increased time and repetition of some cues to initiate/perform functional tasks. Slow to initiate. Per daughter she was asking to leave earlier, decreased insight into her deficits.        Exercises      General Comments General comments (skin integrity, edema, etc.): SpO2 82% when PTA arrived to room, pt on RA and additional pulse oximeter checked, still reading 82%. Once 2L O2 [place] reapplied, pt  returned to 92% within 4 minutes with cues for pursed-lip breathing. HR 60's bpm sitting/supine. BP 132/70 sitting EOB post-exertion and BP 112/56 in bed chair posture at end of session. Pt c/o BLE fatigue/mild dizziness during gait trial, plan to re-check standing BP next session, too fatigued to perform once sitting after gait trial.      Pertinent Vitals/Pain Pain Assessment Pain Assessment: Faces Faces Pain Scale: Hurts a little bit Pain Location: generalized Pain Descriptors / Indicators: Grimacing Pain Intervention(s): Limited activity within patient's tolerance, Monitored during session, Repositioned    Home Living                          Prior Function            PT Goals (current goals can now be found in the care plan section) Acute Rehab PT Goals Patient Stated Goal: to improve activity tolerance PT Goal Formulation: With patient Time For Goal Achievement: 08/16/23 Progress towards PT goals: Progressing toward goals    Frequency    Min 1X/week      PT Plan      Co-evaluation              AM-PAC PT "6 Clicks" Mobility   Outcome Measure  Help needed turning from your back to your side while in a flat bed without using bedrails?: A Little Help needed moving from lying on your back to sitting on the side of a flat bed without using bedrails?: A Little Help needed moving to and from a bed to a chair (including a wheelchair)?: A Little Help needed standing up from a chair using your arms (e.g., wheelchair or bedside chair)?: A Little Help needed to walk in hospital room?: A Little Help needed climbing 3-5 steps with a railing? : Total 6 Click Score: 16    End of Session Equipment Utilized During Treatment: Gait belt;Oxygen Activity Tolerance: Treatment limited secondary to medical complications (Comment);Patient limited by fatigue;Other (comment) (dyspneic, BLE fatigue/dizzy) Patient left: in bed;with call bell/phone within reach;with bed alarm  set;with family/visitor present;Other (comment) (bed chair posture so she can eat dinner) Nurse Communication: Mobility status;Other (comment) (needs supplemental O2 for OOB to bathroom, portable tank in room for pt safety/ease of transfers) PT Visit Diagnosis: Other abnormalities of gait and mobility (R26.89);Muscle weakness (generalized) (M62.81)     Time: 4332-9518 PT Time Calculation (min) (ACUTE ONLY): 26 min  Charges:    $Gait Training: 8-22 mins $Therapeutic Activity: 8-22 mins PT General Charges $$ ACUTE PT VISIT: 1 Visit                     Jamecia Lerman P., PTA Acute Rehabilitation Services Secure Chat Preferred 9a-5:30pm Office: [phone]    Dorathy Kinsman [facility] 08/03/2023, 5:36 PM

## 2023-08-03 NOTE — Progress Notes (Signed)
   Called pt's grandson Dr. Georgiana Shore. Gave him update. Informed him that pt given 2 unit PRBC transfusion today.  Carollee Herter, DO [facility]

## 2023-08-03 NOTE — Progress Notes (Addendum)
[facility] Progress Note  Name: Lisa [name] MRN: [id number] DOB: January 02, 1934  Chief Complaint:  Altered mental status/confusion   Subjective:  Strict ins/outs not available.  Strict ins/outs are not available.  100 mL UOP and two voids which were not able to be measured.  Her fluids were stopped last night.  She is eating on arrival - enjoying her beef and rice.     Review of systems:    She states her breathing is alright  No n/v  Denies chest pain   ------------ Background on referral:  Lisa [name] is a/an 89 y.o. female with a past medical history of HFpEF, CKD, pulmonary hypertension, aortic stenosis, CAD, atrial fibrillation, CML, COPD who presents with confusion.  Because of the patient's confusion history was largely obtained from her daughter at the bedside.  As well as chart review.   Patient was discharged from the hospital on 9/12 after being hospitalized with shortness of breath and NSTEMI that was medically managed.  The daughter states that the patient has become more confused over the past 2 to 3 days.  She is predominantly agitated at night wandering around and hard to redirect.  She frequently takes off her oxygen.  Possibly some shortness of breath particularly when her oxygen is removed.  She also has had very poor p.o. intake for about a week.  Last night had some nausea and vomiting as well as this morning.  Denies fevers and chills.  Possibly having some abdominal pain.  Frequently saying she has to urinate.  Also may have had some blood in her urine yesterday.  Patient's daughter says she tends to drink a lot of water throughout the day.  She was brought in the emergency department.  Urinalysis was normal.  She had a sodium of 118 and potassium 5.5.  She was given [facility] and started on IV fluids.  Urine lytes demonstrated osmolality of 170 and urine sodium of 29.   She had a similar hospitalization in July that mostly improved with IV fluids and was  thought to be related to poor solute intake.   Intake/Output Summary (Last 24 hours) at 08/03/2023 1139 Last data filed at 08/03/2023 1030 Gross per 24 hour  Intake 485.85 ml  Output 0 ml  Net 485.85 ml    Vitals:  Vitals:   08/03/23 0841 08/03/23 1030 08/03/23 1041 08/03/23 1115  BP: (!) 111/54 (!) 105/53 (!) 105/53 (!) 104/50  Pulse: 62 64 64 (!) 57  Resp: 18 16 16 16   Temp: 98.6 F (37 C) 98 F (36.7 C) 98 F (36.7 C) 98.2 F (36.8 C)  TempSrc: Oral Oral Oral Oral  SpO2: 97% 96% 96% 98%  Weight:      Height:         Physical Exam:    General elderly female in bed in no acute distress HEENT normocephalic atraumatic extraocular movements intact sclera anicteric Neck supple trachea midline Lungs clear to auscultation bilaterally normal work of breathing at rest on 2 liters oxygen  Heart S1S2 no rub Abdomen soft nontender nondistended Extremities no edema  Psych normal mood and affect Neuro - alert and oriented to year, location, and person; follows commands   Medications reviewed   Labs:     Latest Ref Rng & Units 08/03/2023    4:43 AM 08/02/2023   11:37 PM 08/02/2023    7:48 PM  BMP  Glucose 70 - 99 mg/dL 83  94  784   BUN 8 - 23  mg/dL 12  12  11    Creatinine 0.44 - 1.00 mg/dL 1.61  0.96  0.45   Sodium 135 - 145 mmol/L 119  120  120   Potassium 3.5 - 5.1 mmol/L 5.3  5.3  5.0   Chloride 98 - 111 mmol/L 91  91  90   CO2 22 - 32 mmol/L 23  24  23    Calcium 8.9 - 10.3 mg/dL 7.9  8.1  8.0      Assessment/Plan:   Lisa [name] is a/an 89 y.o. female with a past medical history HFpEF, CKD, pulmonary hypertension, aortic stenosis, CAD, atrial fibrillation, CML, COPD who present w/ symptomatic hyponatremia    # Symptomatic hyponatremia: Based on urine lytes and history felt related to poor p.o. intake of solutes with concomitant high water intake.  Mixed picture.  Could have some underlying SIADH as well, note remeron use - tolvaptan 7.5 mg PO once now  - Check  sodium every 8 hours x 2 checks per protocol  - I have stopped her remeron which may also be contributing   - Fluids are currently off - sodium declined with hydration      Hyperkalemia: Seems to be a recurrent issue.  Has improved with veltassa.  Changed to low potassium diet for now    Pulmonary hypertension/recent NSTEMI/CHF: Hydration as above.  Monitor respiratory status closely   Chronic hypoxic respiratory failure/COPD: Management per primary   Failure to thrive: This is the largest underlying issue.  Appreciate help from palliative care   Anemia: Multifactorial related to malignancy.  On home ESA; PRBC's per primary team (see PRBC's ordered on 10/1)   CML - per primary team   Would continue inpatient monitoring  [name], MD 08/03/2023 12:01 PM   Spoke with the patient's grandson and provided an update.  He appreciated the call   [name], MD 5:01 PM 08/03/2023

## 2023-08-03 NOTE — Progress Notes (Signed)
Consult received for PIV placement. No infusions, tests, or procedures ordered at this time. Patient has poor vasculature, assessed bilateral arms with [place]. Discussed with nurse to place consult when orders/patient status substantiates need for PIV placement. Nurse VU. Tomasita Morrow, RN VAST

## 2023-08-03 NOTE — Plan of Care (Signed)
  Problem: Clinical Measurements: Goal: Will remain free from infection Outcome: Progressing   Problem: Nutrition: Goal: Adequate nutrition will be maintained Outcome: Progressing   Problem: Safety: Goal: Ability to remain free from injury will improve Outcome: Progressing   Problem: Skin Integrity: Goal: Risk for impaired skin integrity will decrease Outcome: Progressing   

## 2023-08-03 NOTE — Assessment & Plan Note (Signed)
Admission Scr 0.95 Today Scr 1.34 Was on IVF yesterday with NS.  Nephrology managing IVF/hyponatremia

## 2023-08-03 NOTE — Assessment & Plan Note (Signed)
Stable. On zofran. Has not used any zofran

## 2023-08-03 NOTE — TOC CM/SW Note (Signed)
Transition of Care [facility]) - Inpatient Brief Assessment   Patient Details  Name: Lisa [name] MRN: [id number] Date of Birth: 1933/12/14  [facility] CM/SW Contact:    Tom-Johnson, Hershal Coria, RN Phone Number: 08/03/2023, 11:17 AM   Clinical Narrative:  Patient presented to the ED with  worsening Shortness of Breath with Chest Tightness, Generalized Weakness, Abdominal pain with  Emesis and Bilateral Knee pain. Patient admitted with Hyponatremia, NA+ at 118 on admission. Patient had multiple recent hospitalizations.  From home with daughter, Steward Drone, has three supportive children. Has a cane, shower seat, rails walker and home O2 from [employer].  PCP is [name], MD and uses [facility] in [place].   Home health recommended, patient stated she had used [facility] before and would like to use their services again. CM called in recommendations to [facility] with acceptance voiced, info on AVS.   Patient not Medically ready for discharge.  CM will continue to follow as patient progresses with care towards discharge.            Transition of Care Asessment: Insurance and Status: Insurance coverage has been reviewed Patient has primary care physician: Yes Home environment has been reviewed: Yes Prior level of function:: Modified Independent Prior/Current Home Services: No current home services Social Determinants of Health Reivew: SDOH reviewed no interventions necessary Readmission risk has been reviewed: Yes Transition of care needs: transition of care needs identified, TOC will continue to follow

## 2023-08-03 NOTE — Progress Notes (Signed)
SATURATION QUALIFICATIONS: (This note is used to comply with regulatory documentation for home oxygen)  Patient Saturations on Room Air at Rest = 82%  Patient Saturations on Room Air while Ambulating =N/A  Patient Saturations on 2 Liters of oxygen while Ambulating = 92%  Please briefly explain why patient needs home oxygen: Pt hypoxic on RA at rest. Pt received in supine after recent return from walking to bathroom with family assist, O2 [place] not on and SpO2 noted to be 82%. O2 [place] reapplied and kept on at rest and for ambulation.

## 2023-08-03 NOTE — Progress Notes (Signed)
   Transfuse 2 units PRBC for HgB of 6.9  Carollee Herter, DO [facility]

## 2023-08-04 ENCOUNTER — Inpatient Hospital Stay (HOSPITAL_COMMUNITY): Payer: Medicare Other

## 2023-08-04 DIAGNOSIS — Z7189 Other specified counseling: Secondary | ICD-10-CM | POA: Diagnosis not present

## 2023-08-04 DIAGNOSIS — E871 Hypo-osmolality and hyponatremia: Secondary | ICD-10-CM | POA: Diagnosis not present

## 2023-08-04 DIAGNOSIS — Z515 Encounter for palliative care: Secondary | ICD-10-CM | POA: Diagnosis not present

## 2023-08-04 LAB — CBC WITH DIFFERENTIAL/PLATELET
Abs Immature Granulocytes: 0.03 10*3/uL (ref 0.00–0.07)
Basophils Absolute: 0 10*3/uL (ref 0.0–0.1)
Basophils Relative: 0 %
Eosinophils Absolute: 0 10*3/uL (ref 0.0–0.5)
Eosinophils Relative: 0 %
HCT: 33.4 % — ABNORMAL LOW (ref 36.0–46.0)
Hemoglobin: 9.9 g/dL — ABNORMAL LOW (ref 12.0–15.0)
Immature Granulocytes: 1 %
Lymphocytes Relative: 31 %
Lymphs Abs: 1 10*3/uL (ref 0.7–4.0)
MCH: 29 pg (ref 26.0–34.0)
MCHC: 29.6 g/dL — ABNORMAL LOW (ref 30.0–36.0)
MCV: 97.9 fL (ref 80.0–100.0)
Monocytes Absolute: 2 10*3/uL — ABNORMAL HIGH (ref 0.1–1.0)
Monocytes Relative: 63 %
Neutro Abs: 0.2 10*3/uL — CL (ref 1.7–7.7)
Neutrophils Relative %: 5 %
Platelets: 112 10*3/uL — ABNORMAL LOW (ref 150–400)
RBC: 3.41 MIL/uL — ABNORMAL LOW (ref 3.87–5.11)
RDW: 20.3 % — ABNORMAL HIGH (ref 11.5–15.5)
WBC: 3.3 10*3/uL — ABNORMAL LOW (ref 4.0–10.5)
nRBC: 12.3 % — ABNORMAL HIGH (ref 0.0–0.2)

## 2023-08-04 LAB — GLUCOSE, CAPILLARY: Glucose-Capillary: 86 mg/dL (ref 70–99)

## 2023-08-04 LAB — TYPE AND SCREEN
ABO/RH(D): B POS
Antibody Screen: NEGATIVE
Unit division: 0
Unit division: 0

## 2023-08-04 LAB — BLOOD GAS, ARTERIAL
Acid-base deficit: 2.8 mmol/L — ABNORMAL HIGH (ref 0.0–2.0)
Bicarbonate: 25.1 mmol/L (ref 20.0–28.0)
O2 Saturation: 51.4 %
Patient temperature: 37
pCO2 arterial: 56 mm[Hg] — ABNORMAL HIGH (ref 32–48)
pH, Arterial: 7.26 — ABNORMAL LOW (ref 7.35–7.45)
pO2, Arterial: 31 mm[Hg] — CL (ref 83–108)

## 2023-08-04 LAB — BASIC METABOLIC PANEL
Anion gap: 13 (ref 5–15)
Anion gap: 10 (ref 5–15)
BUN: 19 mg/dL (ref 8–23)
BUN: 19 mg/dL (ref 8–23)
CO2: 16 mmol/L — ABNORMAL LOW (ref 22–32)
CO2: 28 mmol/L (ref 22–32)
Calcium: 8.4 mg/dL — ABNORMAL LOW (ref 8.9–10.3)
Calcium: 8.8 mg/dL — ABNORMAL LOW (ref 8.9–10.3)
Chloride: 92 mmol/L — ABNORMAL LOW (ref 98–111)
Chloride: 90 mmol/L — ABNORMAL LOW (ref 98–111)
Creatinine, Ser: 1.32 mg/dL — ABNORMAL HIGH (ref 0.44–1.00)
Creatinine, Ser: 1.23 mg/dL — ABNORMAL HIGH (ref 0.44–1.00)
GFR, Estimated: 39 mL/min — ABNORMAL LOW (ref >60)
GFR, Estimated: 43 mL/min — ABNORMAL LOW (ref >60)
Glucose, Bld: 89 mg/dL (ref 70–99)
Glucose, Bld: 112 mg/dL — ABNORMAL HIGH (ref 70–99)
Potassium: 5.2 mmol/L — ABNORMAL HIGH (ref 3.5–5.1)
Potassium: 4.3 mmol/L (ref 3.5–5.1)
Sodium: 121 mmol/L — ABNORMAL LOW (ref 135–145)
Sodium: 128 mmol/L — ABNORMAL LOW (ref 135–145)

## 2023-08-04 LAB — BPAM RBC
Blood Product Expiration Date: 202411012359
Blood Product Expiration Date: 202411012359
ISSUE DATE / TIME: 202410010807
ISSUE DATE / TIME: 202410011054
Unit Type and Rh: 7300
Unit Type and Rh: 7300

## 2023-08-04 LAB — SODIUM
Sodium: 127 mmol/L — ABNORMAL LOW (ref 135–145)
Sodium: 129 mmol/L — ABNORMAL LOW (ref 135–145)

## 2023-08-04 LAB — CORTISOL: Cortisol, Plasma: 13.1 ug/dL

## 2023-08-04 MED ORDER — SODIUM BICARBONATE 8.4 % IV SOLN
50.0000 meq | Freq: Once | INTRAVENOUS | Status: AC
  Administered 2023-08-04: 50 meq via INTRAVENOUS
  Filled 2023-08-04: qty 50

## 2023-08-04 MED ORDER — SODIUM ZIRCONIUM CYCLOSILICATE 10 G PO PACK: 10.0000 g | PACK | Freq: Once | ORAL | Status: DC

## 2023-08-04 MED ORDER — PNEUMOCOCCAL 20-VAL CONJ VACC 0.5 ML IM SUSY
0.5000 mL | PREFILLED_SYRINGE | INTRAMUSCULAR | Status: AC
  Administered 2023-08-06: 0.5 mL via INTRAMUSCULAR
  Filled 2023-08-04 (×2): qty 0.5

## 2023-08-04 MED ORDER — SODIUM CHLORIDE 0.9 % IV SOLN
2.0000 g | INTRAVENOUS | Status: DC
  Administered 2023-08-04 – 2023-08-05 (×2): 2 g via INTRAVENOUS
  Filled 2023-08-04 (×2): qty 20

## 2023-08-04 MED ORDER — FUROSEMIDE 10 MG/ML IJ SOLN
40.0000 mg | Freq: Once | INTRAMUSCULAR | Status: AC
  Administered 2023-08-04: 40 mg via INTRAVENOUS
  Filled 2023-08-04: qty 4

## 2023-08-04 MED ORDER — INFLUENZA VAC A&B SURF ANT ADJ 0.5 ML IM SUSY
0.5000 mL | PREFILLED_SYRINGE | INTRAMUSCULAR | Status: DC
  Filled 2023-08-04: qty 0.5

## 2023-08-04 MED ORDER — SODIUM BICARBONATE 650 MG PO TABS
650.0000 mg | ORAL_TABLET | Freq: Three times a day (TID) | ORAL | Status: DC
  Administered 2023-08-04 – 2023-08-05 (×4): 650 mg via ORAL
  Filled 2023-08-04 (×4): qty 1

## 2023-08-04 NOTE — Consult Note (Signed)
NAME:  Lisa [name], MRN:  [id number], DOB:  [birth year], LOS: 3 ADMISSION DATE:  07/31/2023, CONSULTATION DATE:  10/2 REFERRING MD:  Dr. Sunnie Nielsen, CHIEF COMPLAINT:   hyponatremia  History of Present Illness:  89 year old female with PMH as below, which is significant for AAA, lung Ca, CML, HFpEF, CKD, PAH, Asthma, PAF not on AC, and CAD.  She wears 2L of oxygen chronically. She was in her usual state of health until 9/29 when she presented to [facility] ED with complaints of SOB, weakness, and poor PO intake. Multiple recent hospitalizations. Son reports progressive decline in her functional status. Upon arrival to ED she was hemodynamically stable. She was found to have hyponatremia to 118. She was admitted to this hospitalist service and nephrology was consulted for the sodium. Initially she was treated with IV fluids, but sodium worsened. A dose of tolvaptan was given with some improvement, but sodium then dipped again. In the AM hours of 10/2 she was more lethargic and PCCM was asked to evaluate for possible ICU transfer.   Pertinent  Medical History   has a past medical history of AAA (abdominal aortic aneurysm) (HCC), Adenocarcinoma of left lung (HCC) (2006), AF (paroxysmal atrial fibrillation) (HCC), Aortic stenosis, Arthritis, Asthma, Cancer of breast, female (HCC), Cancer of lung (HCC), Dysrhythmia, Hypertension, Hypothyroidism, Invasive ductal carcinoma of left breast (HCC) (1999), Mitral regurgitation, Myocardial infarction (HCC), Neutropenia (HCC) (06/09/2016), NSTEMI (non-ST elevated myocardial infarction) (HCC) (04/06/2023), Personal history of radiation therapy, Pulmonary hypertension (HCC), and TIA (transient ischemic attack) (02/10/2022).   Significant Hospital Events: Including procedures, antibiotic start and stop dates in addition to other pertinent events   9/29 admit for hyponatremia  Interim History / Subjective:    Objective   Blood pressure (!) 128/55, pulse 66,  temperature 98.4 F (36.9 C), temperature source Oral, resp. rate 18, height 5\' 3"  (1.6 m), weight 73.5 kg, SpO2 100%.        Intake/Output Summary (Last 24 hours) at 08/04/2023 1323 Last data filed at 08/04/2023 0800 Gross per 24 hour  Intake 1373 ml  Output 1 ml  Net 1372 ml   Filed Weights   08/01/23 1259 08/02/23 1050 08/03/23 0500  Weight: 65.9 kg 71.7 kg 73.5 kg    Examination: General: Thin elderly appearing female HENT: [place]/AT, PERRL, external jugular distension. JVP to the mandible. MM dry.  Lungs: coarse crackles L base Cardiovascular: RRR, no MRG Abdomen: Soft, NT, ND Extremities: No acute deformity or ROM limitation. No lower extremity edema.  Neuro: Alert, oriented x 2  Resolved Hospital Problem list     Assessment & Plan:   Hyponatremia: difficult to get a sense of her volume status. She did not improve with volume. Parts of her exam look volume down, and others seem volume up. Complicated case. - Appreciate nephrology - Not starting hypertonic so can stay on PCU - agree with diuretic trial - Trend sodium  Acute on chronic hypoxemic respiratory failure COPD without acute exacerbation - CXR looks like edema - Further support for a diuretic - Supplemental O2 to keeps sats 92-98% - Continue Breo, Incruse in place of home Trellegy  Mixed gap(mild)/non-gap (mostly) acidosis: not uremic, starvation? - check lactic - urine ketones - trend bicarb on chemistry - bicarb supplementation  Failure to thrive - appreciate palliative care consultation   Remainder per primary  Best Practice (right click and "Reselect all SmartList Selections" daily)   Per primary  Labs   CBC: Recent Labs  Lab 07/31/23 2249  08/01/23 1005 08/02/23 0433 08/03/23 0443 08/04/23 0439  WBC 2.8* 3.1* 3.5* 3.7* 3.3*  NEUTROABS 0.2*  --  0.2* 0.2* 0.2*  HGB 7.8* 7.6* 7.0* 6.9* 9.9*  HCT 25.6* 25.0* 23.5* 22.9* 33.4*  MCV 100.8* 99.2 101.7* 100.4* 97.9  PLT 118* 108* 119* 116*  112*    Basic Metabolic Panel: Recent Labs  Lab 08/02/23 1856 08/02/23 1948 08/02/23 2337 08/03/23 0443 08/03/23 1417 08/03/23 1951 08/04/23 0439  NA 120* 120* 120* 119* 122* 122* 121*  K 5.2* 5.0 5.3* 5.3*  --   --  5.2*  CL 90* 90* 91* 91*  --   --  92*  CO2 21* 23 24 23   --   --  16*  GLUCOSE 98 101* 94 83  --   --  89  BUN 11 11 12 12   --   --  19  CREATININE 1.33* 1.39* 1.31* 1.34*  --   --  1.32*  CALCIUM 8.0* 8.0* 8.1* 7.9*  --   --  8.4*   GFR: Estimated Creatinine Clearance: 28.8 mL/min (A) (by C-G formula based on SCr of 1.32 mg/dL (H)). Recent Labs  Lab 08/01/23 1005 08/02/23 0433 08/03/23 0443 08/04/23 0439  WBC 3.1* 3.5* 3.7* 3.3*    Liver Function Tests: Recent Labs  Lab 07/31/23 2249  AST 32  ALT 12  ALKPHOS 45  BILITOT 0.5  PROT 8.7*  ALBUMIN 3.3*   Recent Labs  Lab 07/31/23 2249  LIPASE 49   No results for input(s): "AMMONIA" in the last 168 hours.  ABG    Component Value Date/Time   PHART 7.26 (L) 08/04/2023 1112   PCO2ART 56 (H) 08/04/2023 1112   PO2ART 31 (LL) 08/04/2023 1112   HCO3 25.1 08/04/2023 1112   ACIDBASEDEF 2.8 (H) 08/04/2023 1112   O2SAT 51.4 08/04/2023 1112     Coagulation Profile: Recent Labs  Lab 07/31/23 2249 08/01/23 0729  INR 1.4* 1.3*    Cardiac Enzymes: No results for input(s): "CKTOTAL", "CKMB", "CKMBINDEX", "TROPONINI" in the last 168 hours.  HbA1C: Hgb A1c MFr Bld  Date/Time Value Ref Range Status  02/11/2022 05:05 AM 4.4 (L) 4.8 - 5.6 % Final    Comment:    (NOTE) Pre diabetes:          5.7%-6.4%  Diabetes:              >6.4%  Glycemic control for   <7.0% adults with diabetes     CBG: Recent Labs  Lab 08/02/23 0748 08/03/23 0719 08/03/23 1116 08/03/23 1552 08/04/23 0715  GLUCAP 109* 73 83 103* 86    Review of Systems:    Bolds are positive  Constitutional: weight loss, gain, night sweats, Fevers, chills, fatigue .  HEENT: headaches, Sore throat, sneezing, nasal congestion,  post nasal drip, Difficulty swallowing, Tooth/dental problems, visual complaints visual changes, ear ache CV:  chest pain, radiates:,Orthopnea, PND, swelling in lower extremities, dizziness, palpitations, syncope.  GI  heartburn, indigestion, abdominal pain, nausea, vomiting, diarrhea, change in bowel habits, loss of appetite, bloody stools.  Resp: cough, productive: , hemoptysis, dyspnea, chest pain, pleuritic.  Skin: rash or itching or icterus GU: dysuria, change in color of urine, urgency or frequency. flank pain, hematuria  MS: joint pain or swelling. decreased range of motion  Psych: change in mood or affect. depression or anxiety.  Neuro: difficulty with speech, weakness, numbness, ataxia    Past Medical History:  She,  has a past medical history of AAA (abdominal aortic aneurysm) (  HCC), Adenocarcinoma of left lung (HCC) (2006), AF (paroxysmal atrial fibrillation) (HCC), Aortic stenosis, Arthritis, Asthma, Cancer of breast, female (HCC), Cancer of lung (HCC), Dysrhythmia, Hypertension, Hypothyroidism, Invasive ductal carcinoma of left breast (HCC) (1999), Mitral regurgitation, Myocardial infarction (HCC), Neutropenia (HCC) (06/09/2016), NSTEMI (non-ST elevated myocardial infarction) (HCC) (04/06/2023), Personal history of radiation therapy, Pulmonary hypertension (HCC), and TIA (transient ischemic attack) (02/10/2022).   Surgical History:   Past Surgical History:  Procedure Laterality Date   BIOPSY  10/23/2020   Procedure: BIOPSY;  Surgeon: Benancio Deeds, MD;  Location: [facility] ENDOSCOPY;  Service: Gastroenterology;;   BREAST BIOPSY     left axillary node dissection   BREAST LUMPECTOMY Left    COLONOSCOPY  03/08/2003   WUJ:WJXBJYNWGN rectal polyps destroyed with the tip of the snare/Polyps at hepatic flexure, splenic flexure at 35 cm/Left-sided diverticula: unable to retrieve path   COLONOSCOPY  09/12/2008   FAO:ZHYQMV rectum and distal sigmoid diminutive polyps/scattered left sided  diverticulum. hyperplastic   COLONOSCOPY N/A 03/06/2013   HQI:ONGEXBM polyp-removed as described above; colonic diverticulosis. hyperplastic polyps. next TCS 03/2018   ESOPHAGOGASTRODUODENOSCOPY (EGD) WITH PROPOFOL N/A 10/23/2020   Procedure: ESOPHAGOGASTRODUODENOSCOPY (EGD) WITH PROPOFOL;  Surgeon: Benancio Deeds, MD;  Location: [facility] ENDOSCOPY;  Service: Gastroenterology;  Laterality: N/A;   FOOT SURGERY     LUNG REMOVAL, PARTIAL     upper lobe     Social History:   reports that she quit smoking about 31 years ago. Her smoking use included cigarettes. She started smoking about 66 years ago. She has a 26.3 pack-year smoking history. She has never used smokeless tobacco. She reports that she does not drink alcohol and does not use drugs.   Family History:  Her family history includes Cancer in her mother and sister. There is no history of Colon cancer.   Allergies Allergies  Allergen Reactions   Meloxicam Other (See Comments)    Caused an injury to the kidneys, per nephrologist   Micardis Hct [Telmisartan-Hctz] Other (See Comments)    Caused an injury to the kidneys, per nephrologist     Home Medications  Prior to Admission medications   Medication Sig Start Date End Date Taking? Authorizing Provider  acetaminophen (TYLENOL) 325 MG tablet Take 1.5-3 tablets (487.5-975 mg total) by mouth every 6 (six) hours as needed for mild pain (or Fever >/= 101). 07/15/23  Yes Johnson, Clanford L, MD  albuterol (VENTOLIN HFA) 108 (90 Base) MCG/ACT inhaler Inhale 1-2 puffs into the lungs every 4 (four) hours as needed for wheezing or shortness of breath. 05/25/22  Yes [provider]  aspirin EC 81 MG tablet Take 1 tablet (81 mg total) by mouth daily. Swallow whole. 07/16/23  Yes Johnson, Clanford L, MD  Fluticasone-Umeclidin-Vilant (TRELEGY ELLIPTA) 100-62.5-25 MCG/ACT AEPB Inhale 1 puff into the lungs daily. 12/18/22  Yes Oretha Milch, MD  isosorbide mononitrate (IMDUR) 60 MG 24 hr tablet  Take 1 tablet (60 mg total) by mouth daily. 07/15/23  Yes Johnson, Clanford L, MD  levothyroxine (SYNTHROID) 100 MCG tablet Take 1 tablet (100 mcg total) by mouth daily before breakfast. 08/28/21  Yes Johnson, Clanford L, MD  magnesium oxide (MAG-OX) 400 MG tablet Take 0.5 tablets (200 mg total) by mouth at bedtime. 06/11/23  Yes Setzer, Lynnell Jude, PA-C  melatonin 3 MG TABS tablet Take 1 tablet (3 mg total) by mouth at bedtime. 06/11/23  Yes Setzer, Lynnell Jude, PA-C  metoprolol succinate (TOPROL-XL) 25 MG 24 hr tablet TAKE 1 TABLET DAILY 02/18/23  Yes Mallipeddi, Vishnu P, MD  mirtazapine (REMERON) 15 MG tablet Take 1 tablet (15 mg total) by mouth at bedtime. 06/11/23  Yes Setzer, Lynnell Jude, PA-C  Multiple Vitamin (MULTI-VITAMIN) tablet Take 1 tablet by mouth daily.   Yes [provider]  pantoprazole (PROTONIX) 40 MG tablet Take 1 tablet (40 mg total) by mouth 2 (two) times daily. 04/09/23  Yes Johnson, Clanford L, MD  pravastatin (PRAVACHOL) 40 MG tablet Take 1 tablet (40 mg total) by mouth every evening. 07/15/23  Yes Johnson, Clanford L, MD  psyllium (HYDROCIL/METAMUCIL) 95 % PACK Take 1 packet by mouth daily. 06/11/23  Yes Setzer, Lynnell Jude, PA-C  senna (SENOKOT) 8.6 MG TABS tablet Take 1 tablet (8.6 mg total) by mouth at bedtime. 06/11/23  Yes Setzer, Lynnell Jude, PA-C  amiodarone (PACERONE) 200 MG tablet Take 1 po BID thru 9/17, then 1 po daily Patient not taking: Reported on 08/01/2023 07/15/23   Cleora Fleet, MD     Critical care time:      Joneen Roach, AGACNP-BC [facility]  See Amion for personal pager PCCM on call pager [phone] until 7pm. Please call Elink 7p-7a. [phone]  08/04/2023 2:08 PM

## 2023-08-04 NOTE — Progress Notes (Signed)
This chaplain responded to PMT NP-Michele's consult for updating the Pt. Advance Directive. The consult communicates the Pt. existing AD was never signed.  This chaplain called the Pt. daughter-[name] Seidl to arrange a time for AD education with the Pt. and Steward Drone. The chaplain understands Steward Drone will visit today after the appropriate time to complete AD education and notarization in the hospital.  The Pt. son-Eddie joins the phone call and shares there is an existing AD document stating the Pt. choice for HCPOA is Georgiana Shore, MD and the Pt. second choice is [place]. The chaplain understands Steward Drone will fax their AD documentation to [phone].   The chaplain provided education and affirmed with Link Snuffer if the document is notarized and records the Pt. current choices there is not reason to complete a new AD. At this time there is not a time scheduled for the chaplain to meet with the Pt. And family at the bedside.  Chaplain Stephanie Acre [phone]

## 2023-08-04 NOTE — Progress Notes (Addendum)
1500 waiting to give report to 4 east at this time called daughter Steward Drone to inform her of room change 1550 patient transferred via [facility] to 4east room 6 on 4L [place] placed in bed with bed alarm on staff aware of patient transfer

## 2023-08-04 NOTE — Progress Notes (Signed)
Pt arrived from ....68M., A/ox .4.Marland Kitchenpt denies any pain, MD aware,CCMD called. CHG bath given,no further needs at this time

## 2023-08-04 NOTE — Plan of Care (Addendum)
Patient alert x4 able to make needs known forgetful needs assistance with ADLs patient on 2l [place] chronic baseline at home. No plans to discharge at this time due to electrolytes  Problem: Education: Goal: Knowledge of General Education information will improve Description: Including pain rating scale, medication(s)/side effects and non-pharmacologic comfort measures Outcome: Progressing   Problem: Health Behavior/Discharge Planning: Goal: Ability to manage health-related needs will improve Outcome: Progressing   Problem: Clinical Measurements: Goal: Ability to maintain clinical measurements within normal limits will improve Outcome: Progressing Goal: Will remain free from infection Outcome: Progressing Goal: Diagnostic test results will improve Outcome: Progressing Goal: Respiratory complications will improve Outcome: Progressing Goal: Cardiovascular complication will be avoided Outcome: Progressing   Problem: Activity: Goal: Risk for activity intolerance will decrease Outcome: Progressing   Problem: Nutrition: Goal: Adequate nutrition will be maintained Outcome: Progressing   Problem: Coping: Goal: Level of anxiety will decrease Outcome: Progressing   Problem: Elimination: Goal: Will not experience complications related to bowel motility Outcome: Progressing Goal: Will not experience complications related to urinary retention Outcome: Progressing   Problem: Pain Managment: Goal: General experience of comfort will improve Outcome: Progressing   Problem: Safety: Goal: Ability to remain free from injury will improve Outcome: Not Progressing Note: R/t patient needing assist with ADLs at baseline    Problem: Skin Integrity: Goal: Risk for impaired skin integrity will decrease Outcome: Progressing

## 2023-08-04 NOTE — Progress Notes (Addendum)
Palliative Medicine Inpatient Follow Up Note HPI: Lisa [name] is a 89 y.o. 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 intake for the past few days.    Palliative care has been asked to get involved to assist with additional goals of care conversations.   Today's Discussion 08/04/2023  *Please note that this is a verbal dictation therefore any spelling or grammatical errors are due to the "Dragon [employer] One" system interpretation.  Chart reviewed inclusive of vital signs, progress notes, laboratory results, and diagnostic images. Sodium level at 119. Appetite poor in the last 24 hours. Going to receive lasix per nephrology notes and Remeron to be Dcd.   I met with Dustina at bedside this morning. She is awake and alert to self and place. She endorses some shortness of breath though denies nausea and pain.   __________________  I met at bedside later in the afternoon with [facility] though no family was present. I spoke to her nurse who endorses that she has been stable.   _________________  I have called patients daughter, Steward Drone this afternoon though there was no answer.  _________________  I spoke to Steward Drone this early evening and reviewed the plan of care.  Steward Drone will fax the PMT a copy of Alizabeth's advanced directives.   Plan for ongoing peripheral Palliative support.   Objective Assessment: Vital Signs Vitals:   08/04/23 0906 08/04/23 1142  BP: (!) 107/48 (!) 128/55  Pulse: 68 66  Resp: 16 18  Temp: 98.4 F (36.9 C)   SpO2: 94% 100%    Intake/Output Summary (Last 24 hours) at  08/04/2023 1308 Last data filed at 08/04/2023 0800 Gross per 24 hour  Intake 1373 ml  Output 1 ml  Net 1372 ml   Last Weight  Most recent update: 08/03/2023  5:53 AM    Weight  73.5 kg (162 lb 0.6 oz)            Gen: Elderly AA F in NAD HEENT: moist mucous membranes CV: Regular rate and rhythm  PULM: On 2LPM [place], breathing is nonlabored  ABD: soft/nontender  EXT: No edema  Neuro: Alert and oriented x2-3  SUMMARY OF RECOMMENDATIONS   Full code at this time --> Patient and her family will review MOST form   Patients daughter will fax a copy of Ads for filing in [place]  Continue to treat the treatable  Patient would benefit from additional conversations with Oncologist - Dr. [name] in the setting of underlying malignancies to better address expectations moving forward   Plan for medical optimization and patient to transition home   Ongoing PMT support   Symptom Management:  Adult FTT: - Nutrition involvement - 1:1 feeding - Provide palatable options --> Patients daughter brought in foods from outside - Remeron has been discontinued in the setting of hyponatremia   Weakness: - PT has seen --> able to mobilize well short distances - Hyponatremia management per nephrology  Nausea: - Continue Zofran as needed   Agitation: - Continue to ensure strict delirium precautions  Billing based on MDM: Moderate ______________________________________________________________________________________ Lamarr Lulas [place] Palliative Medicine Team Team Cell Phone: [phone] Please utilize secure chat with additional questions, if there  is no response within 30 minutes please call the above phone number  Palliative Medicine Team providers are available by phone from 7am to 7pm daily and can be reached through the team cell phone.  Should this patient require assistance outside of these hours, please call the patient's attending physician.

## 2023-08-04 NOTE — Progress Notes (Signed)
Occupational Therapy Treatment Patient Details Name: Lisa [name] MRN: [id number] DOB: [birth year] Today's Date: 08/04/2023   History of present illness 89 y.o. female presents to [facility] hospital on 07/31/2023 with SOB, weakness and poor PO intake. Pt found to be hyponatremic. Pt received 2 units PRBC on 10/1 AM. PMH includes chronic HFpEF, CKD III, PAH, lung adenocarcinoma, CML, moderate AS, AAA, NSTEMI, TIA, PAF.   OT comments  Pt when entering room on 2L was at 88% but noted [place] was not completly in correct position and then was able to bring up to 90% and then when cued to take deep breaths was able to bring up to 92%. Pt required motivation to complete OOB activity and then agreed to go to chair at this time. Lisa [name] was able to complete UE dressing while sitting at EOB with supervision to CGA with line management of o2. Pt then agreed to transfer to chair with requiring 3 attempts and cues on how to use walker to complete stepping pivot transfer to chair. It is noted on initial evaluation they need to go up steps to enter into the home but able to remain on main level of the home. Acute Occupational Therapy will continue to follow to maximize endurance and safety with ADLS.       If plan is discharge home, recommend the following:  A little help with bathing/dressing/bathroom;A little help with walking and/or transfers;Assistance with cooking/housework;Assist for transportation   Equipment Recommendations  None recommended by OT    Recommendations for Other Services      Precautions / Restrictions Precautions Precautions: Fall Precaution Comments: monitor SpO2 Restrictions Weight Bearing Restrictions: No       Mobility Bed Mobility Overal bed mobility: Needs Assistance Bed Mobility: Supine to Sit     Supine to sit: Contact guard     General bed mobility comments: increase in time and motivation to participate    Transfers Overall transfer level: Needs  assistance Equipment used: Rolling walker (2 wheels) Transfers: Sit to/from Stand Sit to Stand: Min assist     Step pivot transfers: Contact guard assist     General transfer comment: took 3 attempts to complete sit to stand transfer     Balance Overall balance assessment: Needs assistance Sitting-balance support: Bilateral upper extremity supported, No upper extremity supported Sitting balance-Leahy Scale: Fair     Standing balance support: Reliant on assistive device for balance, Bilateral upper extremity supported Standing balance-Leahy Scale: Poor                             ADL either performed or assessed with clinical judgement   ADL Overall ADL's : Needs assistance/impaired Eating/Feeding: Independent   Grooming: Wash/dry hands;Wash/dry face;Set up;Sitting   Upper Body Bathing: Supervision/ safety;Cueing for safety;Cueing for sequencing;Sitting   Lower Body Bathing: Minimal assistance;Moderate assistance;Sit to/from stand   Upper Body Dressing : Set up;Sitting   Lower Body Dressing: Minimal assistance;Moderate assistance;Bed level Lower Body Dressing Details (indicate cue type and reason): long sitting in bed Toilet Transfer: Contact guard assist;Cueing for safety;Cueing for sequencing;Stand-pivot;Rolling walker (2 wheels)   Toileting- Clothing Manipulation and Hygiene: Moderate assistance;Cueing for safety;Cueing for sequencing;Sit to/from stand       Functional mobility during ADLs: Contact guard assist;Cueing for safety;Cueing for sequencing;Rolling walker (2 wheels) General ADL Comments: Pt only agreed to get OOB to chair at this time and took 2-3 side steps    Extremity/Trunk Assessment Upper Extremity Assessment  Upper Extremity Assessment: Generalized weakness   Lower Extremity Assessment Lower Extremity Assessment: Defer to PT evaluation        Vision       Perception     Praxis      Cognition Arousal: Alert Behavior During  Therapy: [facility] for tasks assessed/performed Overall Cognitive Status: History of cognitive impairments - at baseline                                 General Comments: Pt needs motivation to complete tasks and needs repeating of instructions.        Exercises      Shoulder Instructions       General Comments Pt when entering room on 2L was at 88% but noted [place] was not completly in correct position and then was able to bring up to 90% and then when cued to take deep breaths was able to bring up to 92%.    Pertinent Vitals/ Pain       Pain Assessment Pain Assessment: Faces Faces Pain Scale: Hurts a little bit Pain Location: generalized Pain Descriptors / Indicators: Grimacing Pain Intervention(s): Limited activity within patient's tolerance, Monitored during session  Home Living                                          Prior Functioning/Environment              Frequency  Min 1X/week        Progress Toward Goals  OT Goals(current goals can now be found in the care plan section)  Progress towards OT goals: Progressing toward goals  Acute Rehab OT Goals Patient Stated Goal: to go home OT Goal Formulation: With patient Time For Goal Achievement: 08/16/23 Potential to Achieve Goals: Good ADL Goals Pt Will Perform Grooming: with supervision;with set-up;standing Pt Will Perform Upper Body Bathing: with set-up Pt Will Perform Lower Body Bathing: with contact guard assist;with supervision Pt Will Perform Upper Body Dressing: with set-up Pt Will Perform Lower Body Dressing: with contact guard assist;with supervision Pt Will Transfer to Toilet: with supervision;with modified independence;ambulating Pt Will Perform Toileting - Clothing Manipulation and hygiene: with contact guard assist;with supervision;sit to/from stand  Plan      Co-evaluation                 AM-PAC OT "6 Clicks" Daily Activity     Outcome Measure   Help from  another person eating meals?: None Help from another person taking care of personal grooming?: A Little Help from another person toileting, which includes using toliet, bedpan, or urinal?: A Little Help from another person bathing (including washing, rinsing, drying)?: A Lot Help from another person to put on and taking off regular upper body clothing?: A Little Help from another person to put on and taking off regular lower body clothing?: A Lot 6 Click Score: 17    End of Session Equipment Utilized During Treatment: Gait belt;Rolling walker (2 wheels)  OT Visit Diagnosis: Unsteadiness on feet (R26.81);Other abnormalities of gait and mobility (R26.89);History of falling (Z91.81);Muscle weakness (generalized) (M62.81)   Activity Tolerance Patient limited by fatigue   Patient Left in chair;with call bell/phone within reach;with chair alarm set   Nurse Communication Mobility status        Time: 4098-1191 OT Time Calculation (min):  28 min  Charges: OT General Charges $OT Visit: 1 Visit OT Treatments $Self Care/Home Management : 23-37 mins  Presley Raddle OTR/L  Acute Rehab Services  [phone] office number   Alphia Moh 08/04/2023, 11:08 AM

## 2023-08-04 NOTE — Progress Notes (Addendum)
[facility] Progress Note  Name: Lisa [name] MRN: [id number] DOB: 1934-09-25  Chief Complaint:  Altered mental status/confusion   Subjective:  Spoke with her grandson yesterday afternoon and we discussed a dose of tolvaptan - he was ok with this and thought that she had gotten this in the past. I was called by the primary team because the patient was more sleepy this morning.  They contacted critical care and I have spoken with critical care.  Per critical care exam she was appropriate but hard of hearing and this is consistent with my exam.  I tried to call her grandson via phone but the mailbox is full and cannot accept messages at this time.  Team is looking to move her to another unit for closer monitoring.   Review of systems:     She thinks her breathing is alright  No n/v  Denies chest pain   ------------ Background on referral:  Jalexa Greninger is a/an 89 y.o. female with a past medical history of HFpEF, CKD, pulmonary hypertension, aortic stenosis, CAD, atrial fibrillation, CML, COPD who presents with confusion.  Because of the patient's confusion history was largely obtained from her daughter at the bedside.  As well as chart review.   Patient was discharged from the hospital on 9/12 after being hospitalized with shortness of breath and NSTEMI that was medically managed.  The daughter states that the patient has become more confused over the past 2 to 3 days.  She is predominantly agitated at night wandering around and hard to redirect.  She frequently takes off her oxygen.  Possibly some shortness of breath particularly when her oxygen is removed.  She also has had very poor p.o. intake for about a week.  Last night had some nausea and vomiting as well as this morning.  Denies fevers and chills.  Possibly having some abdominal pain.  Frequently saying she has to urinate.  Also may have had some blood in her urine yesterday.  Patient's daughter says she tends to drink a  lot of water throughout the day.  She was brought in the emergency department.  Urinalysis was normal.  She had a sodium of 118 and potassium 5.5.  She was given [facility] and started on IV fluids.  Urine lytes demonstrated osmolality of 170 and urine sodium of 29.   She had a similar hospitalization in July that mostly improved with IV fluids and was thought to be related to poor solute intake.   Intake/Output Summary (Last 24 hours) at 08/04/2023 1204 Last data filed at 08/04/2023 0800 Gross per 24 hour  Intake 1373 ml  Output 1 ml  Net 1372 ml    Vitals:  Vitals:   08/03/23 2144 08/04/23 0502 08/04/23 0906 08/04/23 1142  BP:  (!) 125/55 (!) 107/48 (!) 128/55  Pulse:  72 68 66  Resp:  18 16 18   Temp:  97.6 F (36.4 C) 98.4 F (36.9 C)   TempSrc:  Oral Oral   SpO2: 95% 90% 94% 100%  Weight:      Height:         Physical Exam:    General elderly female in bed in no acute distress HEENT normocephalic atraumatic extraocular movements intact sclera anicteric Neck supple trachea midline Lungs basilar crackles on auscultation normal work of breathing at rest on 4 liters oxygen  Heart S1S2 no rub Abdomen soft nontender nondistended Extremities no edema  Psych normal mood and affect Neuro - she is awake on arrival  and is oriented to year, location, and person.  She does have difficulty hearing   Medications reviewed   Labs:     Latest Ref Rng & Units 08/04/2023    4:39 AM 08/03/2023    7:51 PM 08/03/2023    2:17 PM  BMP  Glucose 70 - 99 mg/dL 89     BUN 8 - 23 mg/dL 19     Creatinine 1.61 - 1.00 mg/dL 0.96     Sodium 045 - 409 mmol/L 121  122  122   Potassium 3.5 - 5.1 mmol/L 5.2     Chloride 98 - 111 mmol/L 92     CO2 22 - 32 mmol/L 16     Calcium 8.9 - 10.3 mg/dL 8.4        Assessment/Plan:   Bronna Kromah is a/an 89 y.o. female with a past medical history HFpEF, CKD, pulmonary hypertension, aortic stenosis, CAD, atrial fibrillation, CML, COPD who present w/  symptomatic hyponatremia    # Symptomatic hyponatremia: Based on urine lytes and history felt related to poor p.o. intake of solutes with concomitant high water intake.  Mixed picture.  Could have some underlying SIADH as well, note remeron use.  S/p tolvaptan on 10/1 without much impact.  Fluids are currently off - sodium declined with hydration.  Labs are unchanged with tolvaptan.  - Will give lasix 40 mg IV once given her increased oxygen requirement - repeat BMP at 1500 today to guide   - I have stopped her remeron which may also have been contributing.  Would not restart   Hyperkalemia: Seems to be a recurrent issue.  Has improved with veltassa.  Changed to low potassium diet    Pulmonary hypertension/recent NSTEMI/CHF:  Monitor respiratory status closely   Chronic hypoxic respiratory failure/COPD: Management per primary team    Failure to thrive: This is the largest underlying issue.  Appreciate help from palliative care   Anemia: Multifactorial related to malignancy.  On home ESA; PRBC's per primary team (got PRBC's on 10/1)   CML - per primary team   Would continue inpatient monitoring.  Reached out to family today but was not able to reach her grandson.  Primary team states that they have spoken with him to update   [name], MD 12:30 PM 08/04/2023  I spoke with her grandson to update him.  Sodium improved after 1 dose of lasix.  Holding additional intervention for now.  Checking sodium every 4 hours tonight and then I have given parameters for when to page nephrology overnight (if above 131 before 7 am on 10/3)  [name], MD 5:45 PM 08/04/2023

## 2023-08-04 NOTE — Care Management Important Message (Signed)
Important Message  Patient Details  Name: Lisa [name] MRN: [id number] Date of Birth: 06-21-1934   Important Message Given:  Yes - Medicare IM     Dorena Bodo 08/04/2023, 2:12 PM

## 2023-08-04 NOTE — Progress Notes (Signed)
Patient brought to 4E from 49M. Telemetry box applied, CCMD notified. VSS. Patient oriented to room and staff. Call bell in reach.

## 2023-08-04 NOTE — Progress Notes (Signed)
PROGRESS NOTE    Lisa [name]  ZOX:[id number] DOB: 28-Aug-1934 DOA: 07/31/2023 PCP: [name], MD   Brief Narrative: 89 year old with past medical history significant for chronic heart failure preserved ejection fraction, CKD stage IIIb, severe pulmonary hypertension, history of lung adenocarcinoma, MDS, CML, chronic neutropenia, moderate AAS mild MR, AAA, non-STEMI, TIA, paroxysmal A-fib not on anticoagulation, hypothyroidism, COPD with chronic hypoxia to 3 L of oxygen at baseline presents with worsening shortness of breath, worsening weakness and poor oral intake for the last few days  Patient had multiple recent hospitalization with hypoxic failure, sepsis and non-STEMI over the past couple of months.  Recently discharged from [facility] 07/15/2023.  Recent non-STEMI with recommendation for to manage medically.  Per family patient has been declining, over the last few days she has not been eating or drinking.  She has been having some abdominal discomfort and mild hematuria at home.     Assessment & Plan:   Principal Problem:   Hyponatremia Active Problems:   Myelodysplastic syndrome (HCC)   Failure to thrive in adult   AKI (acute kidney injury) (HCC)   Invasive ductal carcinoma of left breast (HCC)   Adenocarcinoma of left lung (HCC)   Chronic respiratory failure with hypoxia (HCC)   CKD stage 3a, GFR 45-59 ml/min (HCC)   Abdominal aortic aneurysm   Gout   Hypothyroidism   HTN (hypertension)   Generalized muscle weakness   Malnutrition of moderate degree   Paroxysmal atrial fibrillation (HCC)   Chronic diastolic CHF (congestive heart failure) (HCC) - LVEF 60-65% by echo 07-2023   Pancytopenia (HCC)   Hyperkalemia   Nonrheumatic aortic valve stenosis   CAD (coronary artery disease)   CMML (chronic myelomonocytic leukemia) (HCC)   Pulmonary emphysema (HCC)   Nausea and vomiting   1-Hyponatremia: Multifactorial, prior history of SIADH, poor intake of solute  concomitant high water intake.  -S/P IV fluids.  -IV Tolvaptan without much improved.  -Nephrology will proceed with IV lasix.  -Cortisol normal.  -checking aldosterone renin.   Acute Metabolic Encephalopathy; Patient appears sleepy, wake up answer questions.  ABG obtain showed PH 7,2, o2 31. PCO2 56 CCM consulted.   Acidosis, respiratory and metabolic; Plan to monitor Bmet.  One amp IV bicarb ordered.  Sodium bicarb tablet started  CCM consulted.   Acute on chronic Hypoxic  Respiratory failure ABG with acidosis hypercapnic, hypoxic  CCM think blood from ABG result likely venous.  Plan to monitor MS, and Bmet for acidosis.  Plan to continue with 3 L oxygen supplementation. Chronic use.   Hyperkalemia; She will received IV lasix, and Veltassa.   Failure to thrive;  In setting acute illness and malignancy.   Myelodysplastic syndrome -Received 2 unit PRBC 10/01 for hb at 6. Hb increased to 9  AKI on CKD stage 3a, GFR 45-59 ml/min (HCC) Appears to have CKD stage III A. -Monitor renal function -Avoid nephrotoxins Nephrology following.  Monitor on lasix.    Nausea vomiting.  Stable.   Emphysema: Stable.   CMML, Pancytopenia, Anemia , pancytopenia  SP 2 units PRBC 10/01 Follows with Dr Elbert Ewings.  -08-01-2023 pt given a dose of Aranesp as recommended by oncology  CAD: S/p recent NSTEMI(07-12-2023), which was managed medically. No chest pain and troponin currently negative -Continue home aspirin, statin, metoprolol and Imdur   Nonrheumatic aortic valve stenosis  Follow out patient cardiology  Malnutrition moderate degree On supplement.   Generalized  muscle weakness Multifactorial.   HTN: on metoprolol and imdur.  Hypothyroidism; on synthroid.   Gout: stable.   Abdominal aortic aneurysm CT abdomen with abdominal aneurysm of 5.1 cm -Need 33-month follow-up -Outpatient vascular surgery evaluation      Adenocarcinoma of left lung (HCC) Followed by  oncology as an outpatient.   Invasive ductal carcinoma of left breast (HCC) History of lung cancer History of CMML Patient with multiple malignancies.  LDH elevated -Being managed by oncology as outpatient -Patient gets regular colony-stimulating and erythropoietin stimulating agents by oncology -Palliative care consulted    Nutrition Problem: Moderate Malnutrition Etiology: chronic illness    Signs/Symptoms: moderate fat depletion, moderate muscle depletion, energy intake < or equal to 50% for > or equal to 1 month    Interventions: Ensure Enlive (each supplement provides 350kcal and 20 grams of protein)  Estimated body mass index is 28.7 kg/m as calculated from the following:   Height as of this encounter: 5\' 3"  (1.6 m).   Weight as of this encounter: 73.5 kg.   DVT prophylaxis: Lovenox Code Status: Full code Family Communication: grand son over phone Disposition Plan:  Status is: Inpatient Remains inpatient appropriate because: management, AMS, hyponatremia    Consultants:  CCM Nephrology Palliative care  Procedures:    Antimicrobials:    Subjective: She is able to answer questions, she fall asleep in between conversation. She is hard of hearing,., she denies dyspnea.   Objective: Vitals:   08/03/23 1700 08/03/23 2111 08/03/23 2144 08/04/23 0502  BP: (!) 112/56 (!) 144/80  (!) 125/55  Pulse:  66  72  Resp:    18  Temp:  (!) 97.4 F (36.3 C)  97.6 F (36.4 C)  TempSrc:    Oral  SpO2: 94% (!) 86% 95% 90%  Weight:      Height:        Intake/Output Summary (Last 24 hours) at 08/04/2023 0758 Last data filed at 08/04/2023 0615 Gross per 24 hour  Intake 1399 ml  Output 1 ml  Net 1398 ml   Filed Weights   08/01/23 1259 08/02/23 1050 08/03/23 0500  Weight: 65.9 kg 71.7 kg 73.5 kg    Examination:  General exam: Appears calm and comfortable  Respiratory system: Respiratory effort normal. Crackles bases.  Cardiovascular system: S1 & S2 heard,  RRR. No JVD, murmurs, rubs, gallops or clicks. No pedal edema. Gastrointestinal system: Abdomen is nondistended, soft and nontender. Central nervous system: sleepy, wake up answer questions.  Extremities: no edema  Data Reviewed: I have personally reviewed following labs and imaging studies  CBC: Recent Labs  Lab 07/31/23 2249 08/01/23 1005 08/02/23 0433 08/03/23 0443 08/04/23 0439  WBC 2.8* 3.1* 3.5* 3.7* 3.3*  NEUTROABS 0.2*  --  0.2* 0.2* 0.2*  HGB 7.8* 7.6* 7.0* 6.9* 9.9*  HCT 25.6* 25.0* 23.5* 22.9* 33.4*  MCV 100.8* 99.2 101.7* 100.4* 97.9  PLT 118* 108* 119* 116* 112*   Basic Metabolic Panel: Recent Labs  Lab 08/02/23 1856 08/02/23 1948 08/02/23 2337 08/03/23 0443 08/03/23 1417 08/03/23 1951 08/04/23 0439  NA 120* 120* 120* 119* 122* 122* 121*  K 5.2* 5.0 5.3* 5.3*  --   --  5.2*  CL 90* 90* 91* 91*  --   --  92*  CO2 21* 23 24 23   --   --  16*  GLUCOSE 98 101* 94 83  --   --  89  BUN 11 11 12 12   --   --  19  CREATININE 1.33* 1.39* 1.31* 1.34*  --   --  1.32*  CALCIUM 8.0* 8.0* 8.1* 7.9*  --   --  8.4*   GFR: Estimated Creatinine Clearance: 28.8 mL/min (A) (by C-G formula based on SCr of 1.32 mg/dL (H)). Liver Function Tests: Recent Labs  Lab 07/31/23 2249  AST 32  ALT 12  ALKPHOS 45  BILITOT 0.5  PROT 8.7*  ALBUMIN 3.3*   Recent Labs  Lab 07/31/23 2249  LIPASE 49   No results for input(s): "AMMONIA" in the last 168 hours. Coagulation Profile: Recent Labs  Lab 07/31/23 2249 08/01/23 0729  INR 1.4* 1.3*   Cardiac Enzymes: No results for input(s): "CKTOTAL", "CKMB", "CKMBINDEX", "TROPONINI" in the last 168 hours. BNP (last 3 results) No results for input(s): "PROBNP" in the last 8760 hours. HbA1C: No results for input(s): "HGBA1C" in the last 72 hours. CBG: Recent Labs  Lab 08/02/23 0748 08/03/23 0719 08/03/23 1116 08/03/23 1552 08/04/23 0715  GLUCAP 109* 73 83 103* 86   Lipid Profile: No results for input(s): "CHOL", "HDL",  "LDLCALC", "TRIG", "CHOLHDL", "LDLDIRECT" in the last 72 hours. Thyroid Function Tests: Recent Labs    08/01/23 1853  TSH 4.130   Anemia Panel: No results for input(s): "VITAMINB12", "FOLATE", "FERRITIN", "TIBC", "IRON", "RETICCTPCT" in the last 72 hours. Sepsis Labs: No results for input(s): "PROCALCITON", "LATICACIDVEN" in the last 168 hours.  Recent Results (from the past 240 hour(s))  Resp panel by RT-PCR (RSV, Flu A&B, Covid) Anterior Nasal Swab     Status: None   Collection Time: 07/31/23 11:45 PM   Specimen: Anterior Nasal Swab  Result Value Ref Range Status   SARS Coronavirus 2 by RT PCR NEGATIVE NEGATIVE Final   Influenza A by PCR NEGATIVE NEGATIVE Final   Influenza B by PCR NEGATIVE NEGATIVE Final    Comment: (NOTE) The Xpert Xpress SARS-CoV-2/FLU/RSV plus assay is intended as an aid in the diagnosis of influenza from Nasopharyngeal swab specimens and should not be used as a sole basis for treatment. Nasal washings and aspirates are unacceptable for Xpert Xpress SARS-CoV-2/FLU/RSV testing.  Fact Sheet for Patients: BloggerCourse.com  Fact Sheet for Healthcare Providers: SeriousBroker.it  This test is not yet approved or cleared by the [place] FDA and has been authorized for detection and/or diagnosis of SARS-CoV-2 by FDA under an Emergency Use Authorization (EUA). This EUA will remain in effect (meaning this test can be used) for the duration of the COVID-19 declaration under Section 564(b)(1) of the Act, 21 U.S.C. section 360bbb-3(b)(1), unless the authorization is terminated or revoked.     Resp Syncytial Virus by PCR NEGATIVE NEGATIVE Final    Comment: (NOTE) Fact Sheet for Patients: BloggerCourse.com  Fact Sheet for Healthcare Providers: SeriousBroker.it  This test is not yet approved or cleared by the [place] FDA and has been authorized for  detection and/or diagnosis of SARS-CoV-2 by FDA under an Emergency Use Authorization (EUA). This EUA will remain in effect (meaning this test can be used) for the duration of the COVID-19 declaration under Section 564(b)(1) of the Act, 21 U.S.C. section 360bbb-3(b)(1), unless the authorization is terminated or revoked.  Performed at [facility], [address] [place], [place] [address]   Blood culture (routine x 2)     Status: None (Preliminary result)   Collection Time: 07/31/23 11:52 PM   Specimen: BLOOD  Result Value Ref Range Status   Specimen Description BLOOD RIGHT ANTECUBITAL  Final   Special Requests   Final    BOTTLES DRAWN AEROBIC AND ANAEROBIC Blood Culture adequate volume  Culture   Final    NO GROWTH 3 DAYS Performed at [facility], [address], [place], [place] [address]    Report Status PENDING  Incomplete  Blood culture (routine x 2)     Status: None (Preliminary result)   Collection Time: 08/01/23 12:49 AM   Specimen: BLOOD LEFT FOREARM  Result Value Ref Range Status   Specimen Description BLOOD LEFT FOREARM  Final   Special Requests   Final    BOTTLES DRAWN AEROBIC AND ANAEROBIC Blood Culture adequate volume   Culture   Final    NO GROWTH 3 DAYS Performed at [facility], [address] [place], [place] [address]    Report Status PENDING  Incomplete         Radiology Studies: No results found.      Scheduled Meds:  aspirin EC  81 mg Oral Daily   enoxaparin (LOVENOX) injection  30 mg Subcutaneous Q24H   feeding supplement (NEPRO CARB STEADY)  237 mL Oral BID BM   fluticasone furoate-vilanterol  1 puff Inhalation Daily   And   umeclidinium bromide  1 puff Inhalation Daily   isosorbide mononitrate  60 mg Oral Daily   levothyroxine  100 mcg Oral Q0600   magnesium oxide  200 mg Oral QHS   melatonin  3 mg Oral QHS   metoprolol succinate  25 mg Oral Daily   multivitamin with minerals  1 tablet Oral Daily   patiromer   8.4 g Oral Daily   pravastatin  40 mg Oral QPM   psyllium  1 packet Oral Daily   senna  1 tablet Oral QHS   Continuous Infusions:   LOS: 3 days    Time spent: 35 minutes    Herrick Hartog A Scottie Stanish, MD [facility]   If 7PM-7AM, please contact night-coverage [email or web address]  08/04/2023, 7:58 AM

## 2023-08-05 ENCOUNTER — Inpatient Hospital Stay: Payer: Medicare Other | Admitting: Adult Health

## 2023-08-05 DIAGNOSIS — Z7189 Other specified counseling: Secondary | ICD-10-CM | POA: Diagnosis not present

## 2023-08-05 DIAGNOSIS — Z515 Encounter for palliative care: Secondary | ICD-10-CM | POA: Diagnosis not present

## 2023-08-05 LAB — BASIC METABOLIC PANEL
Anion gap: 9 (ref 5–15)
BUN: 18 mg/dL (ref 8–23)
CO2: 28 mmol/L (ref 22–32)
Calcium: 8.7 mg/dL — ABNORMAL LOW (ref 8.9–10.3)
Chloride: 94 mmol/L — ABNORMAL LOW (ref 98–111)
Creatinine, Ser: 1.17 mg/dL — ABNORMAL HIGH (ref 0.44–1.00)
GFR, Estimated: 45 mL/min — ABNORMAL LOW (ref >60)
Glucose, Bld: 85 mg/dL (ref 70–99)
Potassium: 4.3 mmol/L (ref 3.5–5.1)
Sodium: 131 mmol/L — ABNORMAL LOW (ref 135–145)

## 2023-08-05 LAB — GLUCOSE, CAPILLARY: Glucose-Capillary: 91 mg/dL (ref 70–99)

## 2023-08-05 LAB — MRSA NEXT GEN BY PCR, NASAL: MRSA by PCR Next Gen: NOT DETECTED

## 2023-08-05 LAB — SODIUM: Sodium: 132 mmol/L — ABNORMAL LOW (ref 135–145)

## 2023-08-05 MED ORDER — AZITHROMYCIN 500 MG PO TABS
500.0000 mg | ORAL_TABLET | Freq: Every day | ORAL | Status: AC
  Administered 2023-08-05 – 2023-08-09 (×5): 500 mg via ORAL
  Filled 2023-08-05 (×5): qty 1

## 2023-08-05 MED ORDER — SODIUM BICARBONATE 650 MG PO TABS
650.0000 mg | ORAL_TABLET | Freq: Two times a day (BID) | ORAL | Status: DC
  Administered 2023-08-05 – 2023-08-06 (×2): 650 mg via ORAL
  Filled 2023-08-05 (×2): qty 1

## 2023-08-05 NOTE — Progress Notes (Signed)
[facility] Progress Note  Name: Lisa [name] MRN: [id number] DOB: [birth year]  Chief Complaint:  Altered mental status/confusion   Subjective:  Strict ins/outs are not available.  She had 1.2 liters UOP over 10/2 as well as 3 unmeasured urine voids charted.  She was moved to 4 east/progressive. Feels ok.  She asks when she can go home.    Review of systems:       She thinks her breathing is alright; she states she is on 3 liters oxygen at home No n/v  Denies chest pain   ------------ Background on referral:  Lisa [name] is a/an 89 y.o. female with a past medical history of HFpEF, CKD, pulmonary hypertension, aortic stenosis, CAD, atrial fibrillation, CML, COPD who presents with confusion.  Because of the patient's confusion history was largely obtained from her daughter at the bedside.  As well as chart review.   Patient was discharged from the hospital on 9/12 after being hospitalized with shortness of breath and NSTEMI that was medically managed.  The daughter states that the patient has become more confused over the past 2 to 3 days.  She is predominantly agitated at night wandering around and hard to redirect.  She frequently takes off her oxygen.  Possibly some shortness of breath particularly when her oxygen is removed.  She also has had very poor p.o. intake for about a week.  Last night had some nausea and vomiting as well as this morning.  Denies fevers and chills.  Possibly having some abdominal pain.  Frequently saying she has to urinate.  Also may have had some blood in her urine yesterday.  Patient's daughter says she tends to drink a lot of water throughout the day.  She was brought in the emergency department.  Urinalysis was normal.  She had a sodium of 118 and potassium 5.5.  She was given [facility] and started on IV fluids.  Urine lytes demonstrated osmolality of 170 and urine sodium of 29.   She had a similar hospitalization in July that mostly improved with  IV fluids and was thought to be related to poor solute intake.   Intake/Output Summary (Last 24 hours) at 08/05/2023 1211 Last data filed at 08/05/2023 1147 Gross per 24 hour  Intake 100 ml  Output 1901 ml  Net -1801 ml    Vitals:  Vitals:   08/05/23 0323 08/05/23 0610 08/05/23 0737 08/05/23 1146  BP: (!) 107/44  (!) 121/49 (!) 125/57  Pulse: 63  68 63  Resp: 20  20 (!) 21  Temp: 99.6 F (37.6 C)  98 F (36.7 C) 99 F (37.2 C)  TempSrc: Oral  Oral Oral  SpO2: 95%  96% 99%  Weight:  70.4 kg    Height:         Physical Exam:     General elderly female in bed in no acute distress HEENT normocephalic atraumatic extraocular movements intact sclera anicteric Neck supple trachea midline Lungs clear but reduced on auscultation normal work of breathing at rest on 3 liters oxygen  Heart S1S2 harsh murmur.  no rub Abdomen soft nontender nondistended Extremities no edema  Psych normal mood and affect Neuro - she is awake on arrival and is oriented to year, location, and person.  She does have difficulty hearing   Medications reviewed   Labs:     Latest Ref Rng & Units 08/05/2023   10:21 AM 08/05/2023    3:43 AM 08/04/2023    9:58 PM  BMP  Glucose 70 - 99 mg/dL  85    BUN 8 - 23 mg/dL  18    Creatinine 7.25 - 1.00 mg/dL  3.66    Sodium 440 - 347 mmol/L 132  131  129   Potassium 3.5 - 5.1 mmol/L  4.3    Chloride 98 - 111 mmol/L  94    CO2 22 - 32 mmol/L  28    Calcium 8.9 - 10.3 mg/dL  8.7       Assessment/Plan:   Lisa [name] is a/an 89 y.o. female with a past medical history HFpEF, CKD, pulmonary hypertension, aortic stenosis, CAD, atrial fibrillation, CML, COPD who present w/ symptomatic hyponatremia    # Symptomatic hyponatremia:  Mixed picture but ultimately with fluid overload.  Could have some underlying SIADH as well, note remeron use.  S/p tolvaptan on 10/1 without much impact.  Fluids are currently off - sodium declined with hydration.  Labs are unchanged with  tolvaptan but much improved with lasix.  - Much improved with lasix  - Deferred redosing lasix today given the increase in her sodium thus far and stability  - She would benefit from scheduled or PRN lasix on discharge - not any lasix on her home med list.  Set fluid restriction of 1.5 liters  - BMP daily - I have stopped her remeron which may also have been contributing.  Would not restart   Hyperkalemia: Seems to be a recurrent issue.   - Stop veltassa for now as we are giving lasix.   - Changed to low potassium diet    Pulmonary hypertension/recent NSTEMI/CHF:  lasix as above   Chronic hypoxic respiratory failure/COPD: Management per primary team.  Optimize volume status as above    Aortic stenosis - noted as moderate to severe per 07/2023 Echo  Failure to thrive: underlying issue.  Appreciate help from palliative care   Anemia: Multifactorial related to malignancy.  On home ESA; PRBC's per primary team (got PRBC's on 10/1).  CBC in AM    CML - per primary team   Dispo - Would potentially discharge as early as 10/4 from a strictly renal standpoint.  I was not able to reach her grandson by phone but did leave him a voicemail.    Lisa Emms, MD 12:34 PM 08/05/2023

## 2023-08-05 NOTE — Progress Notes (Signed)
PROGRESS NOTE    Lisa [name]  UYQ:[id number] DOB: [birth year] DOA: 07/31/2023 PCP: [name], MD   Brief Narrative: 89 year old with past medical history significant for chronic heart failure preserved ejection fraction, CKD stage IIIb, severe pulmonary hypertension, history of lung adenocarcinoma, MDS, CML, chronic neutropenia, moderate AAS mild MR, AAA, non-STEMI, TIA, paroxysmal A-fib not on anticoagulation, hypothyroidism, COPD with chronic hypoxia to 2-3 L of oxygen at baseline presents with worsening shortness of breath, worsening weakness and poor oral intake for the last few days  Patient had multiple recent hospitalization with hypoxic failure, sepsis and non-STEMI over the past couple of months.  Recently discharged from [facility] 07/15/2023.  Recent non-STEMI with recommendation for to manage medically.  Per family patient has been declining, over the last few days she has not been eating or drinking.  She has been having some abdominal discomfort and mild hematuria at home.  Patient admitted with hyponatremia, FTT.      Assessment & Plan:   Principal Problem:   Hyponatremia Active Problems:   Myelodysplastic syndrome (HCC)   Failure to thrive in adult   AKI (acute kidney injury) (HCC)   Invasive ductal carcinoma of left breast (HCC)   Adenocarcinoma of left lung (HCC)   Chronic respiratory failure with hypoxia (HCC)   CKD stage 3a, GFR 45-59 ml/min (HCC)   Abdominal aortic aneurysm   Gout   Hypothyroidism   HTN (hypertension)   Generalized muscle weakness   Malnutrition of moderate degree   Paroxysmal atrial fibrillation (HCC)   Chronic diastolic CHF (congestive heart failure) (HCC) - LVEF 60-65% by echo 07-2023   Pancytopenia (HCC)   Hyperkalemia   Nonrheumatic aortic valve stenosis   CAD (coronary artery disease)   CMML (chronic myelomonocytic leukemia) (HCC)   Pulmonary emphysema (HCC)   Nausea and vomiting   1-Hyponatremia: Multifactorial, prior  history of SIADH, poor intake of solute concomitant high water intake.  -S/P IV fluids.  -IV Tolvaptan without much improved.  -Nephrology assisting with management.  -Cortisol normal.  -checking aldosterone renin.  -received IV lasix 70/02. Sodium improving.   Acute Metabolic Encephalopathy; Patient was noticed to be more sleepy 10/02. ABG obtain showed PH 7,2, o2 31. PCO2 56 CCM consulted. Support care.  She received lasix. Started on treatment for PNA>  She is more alert today. Acidosis on Bmet improved.   PNA;  10/02: Chest x ray:  Mildly increased mild patchy atelectasis or pneumonia at both lung bases with interval small areas of mild probable pneumonia in the right mid and upper lung zones. Started IV ceftriaxone and Azithro.  Sputum culture.  Oxygen sat stable on 3 L today.   Acidosis, respiratory and metabolic; Plan to monitor Bmet.  Received amp IV bicarb on 10/02.  Sodium bicarb tablet started, change to BID CCM consulted.   Acute on chronic Hypoxic  Respiratory failure ABG with acidosis hypercapnic, hypoxic  CCM think blood from ABG result likely venous.  Plan to continue with 3 L oxygen supplementation. Chronic use.  Patient is more alert today. Received IV lasix 10/02. Started on antibiotics to cover for PNA>    Hyperkalemia; Received IV lasix, and Veltassa.  Resolving.   Failure to thrive;  In setting acute illness and malignancy, anemia.   Myelodysplastic syndrome -Received 2 unit PRBC 10/01 for hb at 6. Hb increased to 9. Repeat labs tomorrow  AKI on CKD stage 3a, GFR 45-59 ml/min (HCC) Appears to have CKD stage III A. -Monitor renal function -Avoid  nephrotoxins Nephrology following.  Monitor on lasix.    Nausea vomiting.  Resolved.   Emphysema: Stable.   CMML, Pancytopenia, Anemia , pancytopenia SP 2 units PRBC 10/01 Follows with Dr Elbert Ewings.  -08-01-2023 pt given a dose of Aranesp as recommended by oncology   CAD: S/p recent  NSTEMI(07-12-2023), which was managed medically. No chest pain and troponin currently negative -Continue home aspirin, statin, metoprolol and Imdur   Nonrheumatic aortic valve stenosis  Follow out patient cardiology  Malnutrition moderate degree On supplement.   Generalized  muscle weakness Multifactorial.   HTN: on metoprolol and imdur.   Hypothyroidism; on synthroid.   Gout: stable.   Abdominal aortic aneurysm CT abdomen with abdominal aneurysm of 5.1 cm -Need 80-month follow-up -Outpatient vascular surgery evaluation      Adenocarcinoma of left lung (HCC) Followed by oncology as an outpatient.   Invasive ductal carcinoma of left breast (HCC) History of lung cancer History of CMML Patient with multiple malignancies.  LDH elevated -Being managed by oncology as outpatient -Patient gets regular colony-stimulating and erythropoietin stimulating agents by oncology -Palliative care consulted    Nutrition Problem: Moderate Malnutrition Etiology: chronic illness    Signs/Symptoms: moderate fat depletion, moderate muscle depletion, energy intake < or equal to 50% for > or equal to 1 month    Interventions: Ensure Enlive (each supplement provides 350kcal and 20 grams of protein)  Estimated body mass index is 27.49 kg/m as calculated from the following:   Height as of this encounter: 5\' 3"  (1.6 m).   Weight as of this encounter: 70.4 kg.   DVT prophylaxis: Lovenox Code Status: Full code Family Communication: grand son over phone 10/02. Will update today  Disposition Plan:  Status is: Inpatient Remains inpatient appropriate because: management, AMS, hyponatremia    Consultants:  CCM Nephrology Palliative care  Procedures:    Antimicrobials:    Subjective: She is alert today. Just finished working with PT. She was able to get to door, but back to bed due to dyspnea, feeling tired. Oxygen drop on ambulation.  She denies worsening dyspnea. She has been  coughing up phlegm/  She ate better today, ate significant amount of breakfast.    Objective: Vitals:   08/04/23 2334 08/05/23 0323 08/05/23 0610 08/05/23 0737  BP: (!) 115/50 (!) 107/44  (!) 121/49  Pulse: 61 63  68  Resp: 20 20  20   Temp: 98.4 F (36.9 C) 99.6 F (37.6 C)  98 F (36.7 C)  TempSrc: Oral Oral  Oral  SpO2: 100% 95%  96%  Weight:   70.4 kg   Height:        Intake/Output Summary (Last 24 hours) at 08/05/2023 0916 Last data filed at 08/05/2023 0347 Gross per 24 hour  Intake 340 ml  Output 1201 ml  Net -861 ml   Filed Weights   08/02/23 1050 08/03/23 0500 08/05/23 0610  Weight: 71.7 kg 73.5 kg 70.4 kg    Examination:  General exam: NAD, chronic ill.   Respiratory system: Normal Respiratory effort, BL crackles.  Cardiovascular system: S 1, S 2 RRR Gastrointestinal system: BS present, soft, nt Central nervous system: Awake, follows command.  Extremities: No edema  Data Reviewed: I have personally reviewed following labs and imaging studies  CBC: Recent Labs  Lab 07/31/23 2249 08/01/23 1005 08/02/23 0433 08/03/23 0443 08/04/23 0439  WBC 2.8* 3.1* 3.5* 3.7* 3.3*  NEUTROABS 0.2*  --  0.2* 0.2* 0.2*  HGB 7.8* 7.6* 7.0* 6.9* 9.9*  HCT 25.6*  25.0* 23.5* 22.9* 33.4*  MCV 100.8* 99.2 101.7* 100.4* 97.9  PLT 118* 108* 119* 116* 112*   Basic Metabolic Panel: Recent Labs  Lab 08/02/23 2337 08/03/23 0443 08/03/23 1417 08/04/23 0439 08/04/23 1413 08/04/23 1810 08/04/23 2158 08/05/23 0343  NA 120* 119*   < > 121* 128* 127* 129* 131*  K 5.3* 5.3*  --  5.2* 4.3  --   --  4.3  CL 91* 91*  --  92* 90*  --   --  94*  CO2 24 23  --  16* 28  --   --  28  GLUCOSE 94 83  --  89 112*  --   --  85  BUN 12 12  --  19 19  --   --  18  CREATININE 1.31* 1.34*  --  1.32* 1.23*  --   --  1.17*  CALCIUM 8.1* 7.9*  --  8.4* 8.8*  --   --  8.7*   < > = values in this interval not displayed.   GFR: Estimated Creatinine Clearance: 31.9 mL/min (A) (by C-G formula  based on SCr of 1.17 mg/dL (H)). Liver Function Tests: Recent Labs  Lab 07/31/23 2249  AST 32  ALT 12  ALKPHOS 45  BILITOT 0.5  PROT 8.7*  ALBUMIN 3.3*   Recent Labs  Lab 07/31/23 2249  LIPASE 49   No results for input(s): "AMMONIA" in the last 168 hours. Coagulation Profile: Recent Labs  Lab 07/31/23 2249 08/01/23 0729  INR 1.4* 1.3*   Cardiac Enzymes: No results for input(s): "CKTOTAL", "CKMB", "CKMBINDEX", "TROPONINI" in the last 168 hours. BNP (last 3 results) No results for input(s): "PROBNP" in the last 8760 hours. HbA1C: No results for input(s): "HGBA1C" in the last 72 hours. CBG: Recent Labs  Lab 08/03/23 0719 08/03/23 1116 08/03/23 1552 08/04/23 0715 08/05/23 0557  GLUCAP 73 83 103* 86 91   Lipid Profile: No results for input(s): "CHOL", "HDL", "LDLCALC", "TRIG", "CHOLHDL", "LDLDIRECT" in the last 72 hours. Thyroid Function Tests: No results for input(s): "TSH", "T4TOTAL", "FREET4", "T3FREE", "THYROIDAB" in the last 72 hours.  Anemia Panel: No results for input(s): "VITAMINB12", "FOLATE", "FERRITIN", "TIBC", "IRON", "RETICCTPCT" in the last 72 hours. Sepsis Labs: No results for input(s): "PROCALCITON", "LATICACIDVEN" in the last 168 hours.  Recent Results (from the past 240 hour(s))  Resp panel by RT-PCR (RSV, Flu A&B, Covid) Anterior Nasal Swab     Status: None   Collection Time: 07/31/23 11:45 PM   Specimen: Anterior Nasal Swab  Result Value Ref Range Status   SARS Coronavirus 2 by RT PCR NEGATIVE NEGATIVE Final   Influenza A by PCR NEGATIVE NEGATIVE Final   Influenza B by PCR NEGATIVE NEGATIVE Final    Comment: (NOTE) The Xpert Xpress SARS-CoV-2/FLU/RSV plus assay is intended as an aid in the diagnosis of influenza from Nasopharyngeal swab specimens and should not be used as a sole basis for treatment. Nasal washings and aspirates are unacceptable for Xpert Xpress SARS-CoV-2/FLU/RSV testing.  Fact Sheet for  Patients: BloggerCourse.com  Fact Sheet for Healthcare Providers: SeriousBroker.it  This test is not yet approved or cleared by the [place] FDA and has been authorized for detection and/or diagnosis of SARS-CoV-2 by FDA under an Emergency Use Authorization (EUA). This EUA will remain in effect (meaning this test can be used) for the duration of the COVID-19 declaration under Section 564(b)(1) of the Act, 21 U.S.C. section 360bbb-3(b)(1), unless the authorization is terminated or revoked.  Resp Syncytial Virus by PCR NEGATIVE NEGATIVE Final    Comment: (NOTE) Fact Sheet for Patients: BloggerCourse.com  Fact Sheet for Healthcare Providers: SeriousBroker.it  This test is not yet approved or cleared by the [place] FDA and has been authorized for detection and/or diagnosis of SARS-CoV-2 by FDA under an Emergency Use Authorization (EUA). This EUA will remain in effect (meaning this test can be used) for the duration of the COVID-19 declaration under Section 564(b)(1) of the Act, 21 U.S.C. section 360bbb-3(b)(1), unless the authorization is terminated or revoked.  Performed at [facility], [address], [place], [place] [address]   Blood culture (routine x 2)     Status: None (Preliminary result)   Collection Time: 07/31/23 11:52 PM   Specimen: BLOOD  Result Value Ref Range Status   Specimen Description BLOOD RIGHT ANTECUBITAL  Final   Special Requests   Final    BOTTLES DRAWN AEROBIC AND ANAEROBIC Blood Culture adequate volume   Culture   Final    NO GROWTH 3 DAYS Performed at [facility], [address] [place], [place] [address]    Report Status PENDING  Incomplete  Blood culture (routine x 2)     Status: None (Preliminary result)   Collection Time: 08/01/23 12:49 AM   Specimen: BLOOD LEFT FOREARM  Result Value Ref Range Status   Specimen  Description BLOOD LEFT FOREARM  Final   Special Requests   Final    BOTTLES DRAWN AEROBIC AND ANAEROBIC Blood Culture adequate volume   Culture   Final    NO GROWTH 3 DAYS Performed at [facility], [address], [place], [place] [address]    Report Status PENDING  Incomplete         Radiology Studies: DG CHEST PORT 1 VIEW  Result Date: 08/04/2023 CLINICAL DATA:  Hypoxia. EXAM: PORTABLE CHEST 1 VIEW COMPARISON:  07/31/2023.  Chest CTA dated 08/01/2023 FINDINGS: Borderline enlarged cardiac silhouette with an interval decrease in size. Poor inspiration. Diffusely prominent pulmonary vasculature, increased. Small amount of mildly increased small amount of patchy opacity at both lung bases with interval small areas of mild patchy opacity in the right mid and upper lung zones. Mediastinal and left axillary surgical clips. Unremarkable bones. IMPRESSION: 1. Increased pulmonary vascular congestion. 2. Mildly increased mild patchy atelectasis or pneumonia at both lung bases with interval small areas of mild probable pneumonia in the right mid and upper lung zones. Electronically Signed   By: Beckie Salts M.D.   On: 08/04/2023 13:31        Scheduled Meds:  aspirin EC  81 mg Oral Daily   enoxaparin (LOVENOX) injection  30 mg Subcutaneous Q24H   feeding supplement (NEPRO CARB STEADY)  237 mL Oral BID BM   fluticasone furoate-vilanterol  1 puff Inhalation Daily   And   umeclidinium bromide  1 puff Inhalation Daily   influenza vaccine adjuvanted  0.5 mL Intramuscular Tomorrow-1000   isosorbide mononitrate  60 mg Oral Daily   levothyroxine  100 mcg Oral Q0600   magnesium oxide  200 mg Oral QHS   melatonin  3 mg Oral QHS   metoprolol succinate  25 mg Oral Daily   multivitamin with minerals  1 tablet Oral Daily   patiromer  8.4 g Oral Daily   pneumococcal 20-valent conjugate vaccine  0.5 mL Intramuscular Tomorrow-1000   pravastatin  40 mg Oral QPM   psyllium  1 packet Oral Daily    senna  1 tablet Oral QHS   sodium bicarbonate  650 mg Oral TID   Continuous Infusions:  cefTRIAXone (ROCEPHIN)  IV 2 g (08/04/23 1819)     LOS: 4 days    Time spent: 35 minutes    Catelin Manthe A Enslee Bibbins, MD [facility]   If 7PM-7AM, please contact night-coverage [email or web address]  08/05/2023, 9:16 AM

## 2023-08-05 NOTE — Progress Notes (Signed)
Nutrition Follow-up  DOCUMENTATION CODES:   Non-severe (moderate) malnutrition in context of chronic illness  INTERVENTION:  Liberalize diet to regular in the setting of advanced age and moderate malnutrition Continue Nepro Shake po BID, each supplement provides 425 kcal and 19 grams protein Magic cup TID with meals, each supplement provides 290 kcal and 9 grams of protein  NUTRITION DIAGNOSIS:   Moderate Malnutrition related to chronic illness as evidenced by moderate fat depletion, moderate muscle depletion, energy intake < or equal to 50% for > or equal to 1 month. - remains applicable  GOAL:   Patient will meet greater than or equal to 90% of their needs - goal unmet, addressing via meals and nutrition supplements  MONITOR:   PO intake, Supplement acceptance, Labs, Weight trends  REASON FOR ASSESSMENT:   Consult Assessment of nutrition requirement/status  ASSESSMENT:   89 y.o. female Admitted with hyponatremia. Past medical history of HFpEF, CKD, pulmonary hypertension, aortic stenosis, CAD, atrial fibrillation, CML, COPD, Breast and lung cancer. Pt daughter reports more agitated at night,  Reported poor oral intake with N/V x 1 day.  Nephrology following for management of hyponatremia in setting of fluid overload with poor solute intake. Suspecting of underlying SIADH as well. Fluid restriction of 1.5L initiated today.    Spoke with pt at bedside. NT also present. Breakfast tray removed prior to being able to document amount consumed. Pt reports that she ate most of her breakfast meal and that her son was pleased with this.   She states that her appetite is improving. Reports improvement in nausea as well. Recalls consuming nutrition supplements 1-2 times per day.   Unfortunately, there are multiple undocumented meal completion on file for review. Do not suspect that pt is exceeding potassium limits with po intake given report of decreased oral intake, meal completions  <50% and n/v PTA. In the setting of malnutrition and advanced age, pt is high nutritional risk and would benefit from least liberalized diet to promote nutritional adequacy.  Meal completions: 9/30: 40% breakfast 10/1: 75% breakfast, 30% dinner 10/2: 0% breakfast, 50% lunch  Edema: mild pitting BLE  Medications: zithromax, mag-ox, melatonin, MVI, psyllium, senna, sodium bicarb, IV abx  Labs: sodium 132, potassium 4.3 (WDL), GFR 45, CBG's 86, 91 x24 hours  UOP:  x24 hours + 3 unmeasured occurrences  Diet Order:   Diet Order             Diet regular Room service appropriate? Yes with Assist; Fluid consistency: Thin; Fluid restriction: 1500 mL Fluid  Diet effective now                   EDUCATION NEEDS:   No education needs have been identified at this time  Skin:  Skin Assessment: Reviewed RN Assessment  Last BM:  10/1  Height:   Ht Readings from Last 1 Encounters:  08/02/23 5\' 3"  (1.6 m)    Weight:   Wt Readings from Last 1 Encounters:  08/05/23 70.4 kg    Ideal Body Weight:  52 kg  BMI:  Body mass index is 27.49 kg/m.  Estimated Nutritional Needs:   Kcal:  1500-1700  Protein:  85-100g  Fluid:  >/=1.5L  Drusilla Kanner, RDN, LDN Clinical Nutrition

## 2023-08-05 NOTE — Progress Notes (Signed)
Physical Therapy Treatment Patient Details Name: Lisa [name] MRN: [id number] DOB: 1933-11-14 Today's Date: 08/05/2023   History of Present Illness 89 y.o. female presents to [facility] on 07/31/2023 with SOB, weakness and poor PO intake. Pt found to be hyponatremic. Pt received 2 units PRBC on 10/1 AM. PMH includes chronic HFpEF, CKD III, PAH, lung adenocarcinoma, CML, moderate AS, AAA, NSTEMI, TIA, PAF.    PT Comments  Pt received longsitting in bed and agreeable to session with encouragement. Pt demonstrating impaired activity tolerance and weakness limiting mobility this session. Pt able to tolerate a short gait distance in the room, however reports weakness with slight BLE buckling and need for a seated rest break. Pt able to ambulate back to bed with min A for balance and RW management. SpO2 noted to drop to 86% on 3L after gait trial and pt required increase to 4L for improvement to >90%. Son present throughout session and reporting concerns about returning home with pt's demonstrated weakness and impaired endurance. Patient will benefit from continued inpatient follow up therapy, <3 hours/day to increase functional independence before returning home.  After discussion with supervising PT Ferd Glassing., discharge recommendations have been updated. Pt continues to benefit from PT services to progress toward functional mobility goals.    If plan is discharge home, recommend the following: A little help with walking and/or transfers;A little help with bathing/dressing/bathroom;Assistance with cooking/housework;Direct supervision/assist for medications management;Direct supervision/assist for financial management;Help with stairs or ramp for entrance;Assist for transportation   Can travel by private vehicle     Yes  Equipment Recommendations  None recommended by PT    Recommendations for Other Services       Precautions / Restrictions Precautions Precautions: Fall Precaution Comments: monitor  SpO2 Restrictions Weight Bearing Restrictions: No     Mobility  Bed Mobility Overal bed mobility: Needs Assistance Bed Mobility: Supine to Sit, Sit to Supine     Supine to sit: Contact guard Sit to supine: Min assist   General bed mobility comments: Min A for BLE elevation to EOB and total A for supine scoot towards HOB    Transfers Overall transfer level: Needs assistance Equipment used: Rolling walker (2 wheels) Transfers: Sit to/from Stand Sit to Stand: Contact guard assist, Min assist           General transfer comment: From EOB with CGA and chair with min A for power up    Ambulation/Gait Ambulation/Gait assistance: Min assist Gait Distance (Feet): 10 Feet (x2) Assistive device: Rolling walker (2 wheels) Gait Pattern/deviations: Step-through pattern, Wide base of support, Trunk flexed Gait velocity: reduced     General Gait Details: Slow gait and pt requires cues for RW proximity due to pt flexing trunk and pushing RW forward with increased fatigue. Pt reporting weakness and requiring a seated rest break.      Balance Overall balance assessment: Needs assistance Sitting-balance support: Bilateral upper extremity supported, No upper extremity supported, Feet supported Sitting balance-Leahy Scale: Fair Sitting balance - Comments: sitting EOB   Standing balance support: Reliant on assistive device for balance, Bilateral upper extremity supported, During functional activity Standing balance-Leahy Scale: Poor Standing balance comment: with RW support                            Cognition Arousal: Alert Behavior During Therapy: WFL for tasks assessed/performed Overall Cognitive Status: History of cognitive impairments - at baseline  Exercises      General Comments General comments (skin integrity, edema, etc.): Pt on 3L O2 upon entry with SpO2 >92%. Unable to obtain stable pleth during  ambulation, but SpO2 at 86% seated EOB at end of gait trial and O2 increased to 4L to improve to >90%. Pt left on 3L at end of session with SpO2 >90%.      Pertinent Vitals/Pain Pain Assessment Pain Assessment: No/denies pain     PT Goals (current goals can now be found in the care plan section) Acute Rehab PT Goals Patient Stated Goal: to improve activity tolerance PT Goal Formulation: With patient Time For Goal Achievement: 08/16/23 Progress towards PT goals: Progressing toward goals    Frequency    Min 1X/week       AM-PAC PT "6 Clicks" Mobility   Outcome Measure  Help needed turning from your back to your side while in a flat bed without using bedrails?: A Little Help needed moving from lying on your back to sitting on the side of a flat bed without using bedrails?: A Little Help needed moving to and from a bed to a chair (including a wheelchair)?: A Little Help needed standing up from a chair using your arms (e.g., wheelchair or bedside chair)?: A Little Help needed to walk in hospital room?: A Little Help needed climbing 3-5 steps with a railing? : Total 6 Click Score: 16    End of Session Equipment Utilized During Treatment: Gait belt;Oxygen Activity Tolerance: Patient limited by fatigue Patient left: in bed;with call bell/phone within reach;with bed alarm set;with family/visitor present Nurse Communication: Mobility status PT Visit Diagnosis: Other abnormalities of gait and mobility (R26.89);Muscle weakness (generalized) (M62.81)     Time: 9323-5573 PT Time Calculation (min) (ACUTE ONLY): 32 min  Charges:    $Gait Training: 8-22 mins $Therapeutic Activity: 8-22 mins PT General Charges $$ ACUTE PT VISIT: 1 Visit                     Johny Shock, PTA Acute Rehabilitation Services Secure Chat Preferred  [phone]    Johny Shock 08/05/2023, 9:48 AM

## 2023-08-05 NOTE — Progress Notes (Addendum)
NAME:  Otila Manganiello, MRN:  [id number], DOB:  1933-12-21, LOS: 4 ADMISSION DATE:  07/31/2023, CONSULTATION DATE:  10/2 REFERRING MD:  Dr. Sunnie Nielsen, CHIEF COMPLAINT:   hyponatremia  History of Present Illness:  89 year old female with PMH as below, which is significant for AAA, lung Ca, CML, HFpEF, CKD, PAH, Asthma, PAF not on AC, and CAD.  She wears 2L of oxygen chronically. She was in her usual state of health until 9/29 when she presented to [facility] ED with complaints of SOB, weakness, and poor PO intake. Multiple recent hospitalizations. Son reports progressive decline in her functional status. Upon arrival to ED she was hemodynamically stable. She was found to have hyponatremia to 118. She was admitted to this hospitalist service and nephrology was consulted for the sodium. Initially she was treated with IV fluids, but sodium worsened. A dose of tolvaptan was given with some improvement, but sodium then dipped again. In the AM hours of 10/2 she was more lethargic and PCCM was asked to evaluate for possible ICU transfer.   Pertinent  Medical History   has a past medical history of AAA (abdominal aortic aneurysm) (HCC), Adenocarcinoma of left lung (HCC) (2006), AF (paroxysmal atrial fibrillation) (HCC), Aortic stenosis, Arthritis, Asthma, Cancer of breast, female (HCC), Cancer of lung (HCC), Dysrhythmia, Hypertension, Hypothyroidism, Invasive ductal carcinoma of left breast (HCC) (1999), Mitral regurgitation, Myocardial infarction (HCC), Neutropenia (HCC) (06/09/2016), NSTEMI (non-ST elevated myocardial infarction) (HCC) (04/06/2023), Personal history of radiation therapy, Pulmonary hypertension (HCC), and TIA (transient ischemic attack) (02/10/2022).   Significant Hospital Events: Including procedures, antibiotic start and stop dates in addition to other pertinent events   9/29 admit for hyponatremia  Interim History / Subjective:  Sleeping  Objective   Blood pressure (!) 121/49, pulse 68,  temperature 98 F (36.7 C), temperature source Oral, resp. rate 20, height 5\' 3"  (1.6 m), weight 70.4 kg, SpO2 96%.        Intake/Output Summary (Last 24 hours) at 08/05/2023 0844 Last data filed at 08/05/2023 0347 Gross per 24 hour  Intake 340 ml  Output 1201 ml  Net -861 ml   Filed Weights   08/02/23 1050 08/03/23 0500 08/05/23 0610  Weight: 71.7 kg 73.5 kg 70.4 kg    Examination: Elderly female who is asleep but arouses easily No JVD lymphadenopathy is appreciated Creased breath sounds throughout Heart sounds are distant Abdomen soft nontender Extremities warm and dry  Resolved Hospital Problem list     Assessment & Plan:   Hyponatremia: difficult to get a sense of her volume status. She did not improve with volume. Parts of her exam look volume down, and others seem volume up. Complicated case. Nephrology was following Sodium is improved  Acute on chronic hypoxemic respiratory failure COPD without acute exacerbation Continue bronchodilators Diuretics Paroxysmal antimicrobial therapy  Mixed gap(mild)/non-gap (mostly) acidosis: not uremic, starvation? Lactic acid is normal Sodium is slightly improved Continue to monitor Bicarb as needed Failure to thrive Palliative care now involved   Remainder per primary  Best Practice (right click and "Reselect all SmartList Selections" daily)   Per primary  Labs   CBC: Recent Labs  Lab 07/31/23 2249 08/01/23 1005 08/02/23 0433 08/03/23 0443 08/04/23 0439  WBC 2.8* 3.1* 3.5* 3.7* 3.3*  NEUTROABS 0.2*  --  0.2* 0.2* 0.2*  HGB 7.8* 7.6* 7.0* 6.9* 9.9*  HCT 25.6* 25.0* 23.5* 22.9* 33.4*  MCV 100.8* 99.2 101.7* 100.4* 97.9  PLT 118* 108* 119* 116* 112*    Basic Metabolic  Panel: Recent Labs  Lab 08/02/23 2337 08/03/23 0443 08/03/23 1417 08/04/23 0439 08/04/23 1413 08/04/23 1810 08/04/23 2158 08/05/23 0343  NA 120* 119*   < > 121* 128* 127* 129* 131*  K 5.3* 5.3*  --  5.2* 4.3  --   --  4.3  CL 91* 91*   --  92* 90*  --   --  94*  CO2 24 23  --  16* 28  --   --  28  GLUCOSE 94 83  --  89 112*  --   --  85  BUN 12 12  --  19 19  --   --  18  CREATININE 1.31* 1.34*  --  1.32* 1.23*  --   --  1.17*  CALCIUM 8.1* 7.9*  --  8.4* 8.8*  --   --  8.7*   < > = values in this interval not displayed.   GFR: Estimated Creatinine Clearance: 31.9 mL/min (A) (by C-G formula based on SCr of 1.17 mg/dL (H)). Recent Labs  Lab 08/01/23 1005 08/02/23 0433 08/03/23 0443 08/04/23 0439  WBC 3.1* 3.5* 3.7* 3.3*    Liver Function Tests: Recent Labs  Lab 07/31/23 2249  AST 32  ALT 12  ALKPHOS 45  BILITOT 0.5  PROT 8.7*  ALBUMIN 3.3*   Recent Labs  Lab 07/31/23 2249  LIPASE 49   No results for input(s): "AMMONIA" in the last 168 hours.  ABG    Component Value Date/Time   PHART 7.26 (L) 08/04/2023 1112   PCO2ART 56 (H) 08/04/2023 1112   PO2ART 31 (LL) 08/04/2023 1112   HCO3 25.1 08/04/2023 1112   ACIDBASEDEF 2.8 (H) 08/04/2023 1112   O2SAT 51.4 08/04/2023 1112     Coagulation Profile: Recent Labs  Lab 07/31/23 2249 08/01/23 0729  INR 1.4* 1.3*    Cardiac Enzymes: No results for input(s): "CKTOTAL", "CKMB", "CKMBINDEX", "TROPONINI" in the last 168 hours.  HbA1C: Hgb A1c MFr Bld  Date/Time Value Ref Range Status  02/11/2022 05:05 AM 4.4 (L) 4.8 - 5.6 % Final    Comment:    (NOTE) Pre diabetes:          5.7%-6.4%  Diabetes:              >6.4%  Glycemic control for   <7.0% adults with diabetes     CBG: Recent Labs  Lab 08/03/23 0719 08/03/23 1116 08/03/23 1552 08/04/23 0715 08/05/23 0557  GLUCAP 73 83 103* 86 91      Steve Minor ACNP Acute Care Nurse Practitioner [facility] Please consult Amion 08/05/2023, 8:44 AM     PCCM:  Case discussed with Devra Dopp, NP. I agree with documentation.   Plan discussed with Dr. Sunnie Nielsen. PCCM will sign off at this time.   Josephine Igo, DO [facility] 08/05/2023 2:12  PM

## 2023-08-06 ENCOUNTER — Inpatient Hospital Stay (HOSPITAL_COMMUNITY): Payer: Medicare Other

## 2023-08-06 ENCOUNTER — Ambulatory Visit: Payer: Medicare Other | Admitting: Student

## 2023-08-06 ENCOUNTER — Telehealth (HOSPITAL_COMMUNITY): Payer: Self-pay

## 2023-08-06 DIAGNOSIS — E871 Hypo-osmolality and hyponatremia: Secondary | ICD-10-CM | POA: Diagnosis not present

## 2023-08-06 DIAGNOSIS — Z7189 Other specified counseling: Secondary | ICD-10-CM | POA: Diagnosis not present

## 2023-08-06 DIAGNOSIS — N179 Acute kidney failure, unspecified: Secondary | ICD-10-CM | POA: Diagnosis not present

## 2023-08-06 DIAGNOSIS — Z515 Encounter for palliative care: Secondary | ICD-10-CM | POA: Diagnosis not present

## 2023-08-06 DIAGNOSIS — J9621 Acute and chronic respiratory failure with hypoxia: Secondary | ICD-10-CM

## 2023-08-06 LAB — BASIC METABOLIC PANEL
Anion gap: 9 (ref 5–15)
BUN: 18 mg/dL (ref 8–23)
CO2: 29 mmol/L (ref 22–32)
Calcium: 8.5 mg/dL — ABNORMAL LOW (ref 8.9–10.3)
Chloride: 93 mmol/L — ABNORMAL LOW (ref 98–111)
Creatinine, Ser: 1.04 mg/dL — ABNORMAL HIGH (ref 0.44–1.00)
GFR, Estimated: 52 mL/min — ABNORMAL LOW (ref >60)
Glucose, Bld: 101 mg/dL — ABNORMAL HIGH (ref 70–99)
Potassium: 4.5 mmol/L (ref 3.5–5.1)
Sodium: 131 mmol/L — ABNORMAL LOW (ref 135–145)

## 2023-08-06 LAB — CULTURE, BLOOD (ROUTINE X 2)
Culture: NO GROWTH
Culture: NO GROWTH
Special Requests: ADEQUATE
Special Requests: ADEQUATE

## 2023-08-06 LAB — CBC
HCT: 27 % — ABNORMAL LOW (ref 36.0–46.0)
Hemoglobin: 7.8 g/dL — ABNORMAL LOW (ref 12.0–15.0)
MCH: 29.3 pg (ref 26.0–34.0)
MCHC: 28.9 g/dL — ABNORMAL LOW (ref 30.0–36.0)
MCV: 101.5 fL — ABNORMAL HIGH (ref 80.0–100.0)
Platelets: 108 10*3/uL — ABNORMAL LOW (ref 150–400)
RBC: 2.66 MIL/uL — ABNORMAL LOW (ref 3.87–5.11)
RDW: 19.9 % — ABNORMAL HIGH (ref 11.5–15.5)
WBC: 3.8 10*3/uL — ABNORMAL LOW (ref 4.0–10.5)
nRBC: 4.9 % — ABNORMAL HIGH (ref 0.0–0.2)

## 2023-08-06 LAB — GLUCOSE, CAPILLARY: Glucose-Capillary: 89 mg/dL (ref 70–99)

## 2023-08-06 LAB — BRAIN NATRIURETIC PEPTIDE: B Natriuretic Peptide: 353.3 pg/mL — ABNORMAL HIGH (ref 0.0–100.0)

## 2023-08-06 MED ORDER — FUROSEMIDE 10 MG/ML IJ SOLN
40.0000 mg | Freq: Two times a day (BID) | INTRAMUSCULAR | Status: DC
  Administered 2023-08-06: 40 mg via INTRAVENOUS
  Filled 2023-08-06: qty 4

## 2023-08-06 MED ORDER — FUROSEMIDE 10 MG/ML IJ SOLN: 20.0000 mg | Freq: Two times a day (BID) | INTRAMUSCULAR | Status: DC

## 2023-08-06 MED ORDER — FOLIC ACID 1 MG PO TABS
1.0000 mg | ORAL_TABLET | Freq: Every day | ORAL | Status: DC
  Administered 2023-08-06 – 2023-08-11 (×6): 1 mg via ORAL
  Filled 2023-08-06 (×6): qty 1

## 2023-08-06 MED ORDER — FUROSEMIDE 10 MG/ML IJ SOLN
40.0000 mg | Freq: Once | INTRAMUSCULAR | Status: AC
  Administered 2023-08-06: 40 mg via INTRAVENOUS
  Filled 2023-08-06: qty 4

## 2023-08-06 MED ORDER — INFLUENZA VAC A&B SURF ANT ADJ 0.5 ML IM SUSY
0.5000 mL | PREFILLED_SYRINGE | INTRAMUSCULAR | Status: AC
  Administered 2023-08-07: 0.5 mL via INTRAMUSCULAR
  Filled 2023-08-06: qty 0.5

## 2023-08-06 MED ORDER — PIPERACILLIN-TAZOBACTAM 3.375 G IVPB
3.3750 g | Freq: Three times a day (TID) | INTRAVENOUS | Status: AC
  Administered 2023-08-06 – 2023-08-11 (×15): 3.375 g via INTRAVENOUS
  Filled 2023-08-06 (×15): qty 50

## 2023-08-06 MED ORDER — GUAIFENESIN ER 600 MG PO TB12
600.0000 mg | ORAL_TABLET | Freq: Two times a day (BID) | ORAL | Status: DC
  Administered 2023-08-06 – 2023-08-16 (×19): 600 mg via ORAL
  Filled 2023-08-06 (×22): qty 1

## 2023-08-06 NOTE — Progress Notes (Addendum)
PROGRESS NOTE    Lisa [name]  ZOX:[id number] DOB: 21-Feb-1934 DOA: 07/31/2023 PCP: [name], MD   Brief Narrative: 89 year old with past medical history significant for chronic heart failure preserved ejection fraction, CKD stage IIIb, severe pulmonary hypertension, history of lung adenocarcinoma, MDS, CML, chronic neutropenia, moderate AAS mild MR, AAA, non-STEMI, TIA, paroxysmal A-fib not on anticoagulation, hypothyroidism, COPD with chronic hypoxia to 2-3 L of oxygen at baseline presents with worsening shortness of breath, worsening weakness and poor oral intake for the last few days  Patient had multiple recent hospitalization with hypoxic failure, sepsis and non-STEMI over the past couple of months.  Recently discharged from [facility] 07/15/2023.  Recent non-STEMI with recommendation for to manage medically.  Per family patient has been declining, over the last few days she has not been eating or drinking.  She has been having some abdominal discomfort and mild hematuria at home.  Patient admitted with hyponatremia, FTT.      Assessment & Plan:   Principal Problem:   Hyponatremia Active Problems:   Myelodysplastic syndrome (HCC)   Failure to thrive in adult   AKI (acute kidney injury) (HCC)   Invasive ductal carcinoma of left breast (HCC)   Adenocarcinoma of left lung (HCC)   Chronic respiratory failure with hypoxia (HCC)   CKD stage 3a, GFR 45-59 ml/min (HCC)   Abdominal aortic aneurysm   Gout   Hypothyroidism   HTN (hypertension)   Generalized muscle weakness   Malnutrition of moderate degree   Paroxysmal atrial fibrillation (HCC)   Chronic diastolic CHF (congestive heart failure) (HCC) - LVEF 60-65% by echo 07-2023   Pancytopenia (HCC)   Hyperkalemia   Nonrheumatic aortic valve stenosis   CAD (coronary artery disease)   CMML (chronic myelomonocytic leukemia) (HCC)   Pulmonary emphysema (HCC)   Nausea and vomiting   1-Hyponatremia: Multifactorial, prior  history of SIADH, poor intake of solute concomitant high water intake.  -S/P IV fluids.  -IV Tolvaptan without much improved.  -Nephrology assisting with management.  -Cortisol normal.  -Checking aldosterone renin.  -Received IV lasix 70/02. Sodium improving. Stable.   Acute Metabolic Encephalopathy; Patient was noticed to be more sleepy 10/02. ABG obtain showed PH 7,2, o2 31. PCO2 56 CCM consulted. Support care.  She received lasix. Started on treatment for PNA>  She is more alert today. Acidosis on Bmet improved.   PNA;  10/02: Chest x ray:  Mildly increased mild patchy atelectasis or pneumonia at both lung bases with interval small areas of mild probable pneumonia in the right mid and upper lung zones. Started IV ceftriaxone and Azithro. Day 3 Sputum culture.  Oxygen sat down to 89 on 3l.  Plan to repeat Chest x ray. If worsening pulmonary edema will give IV lasix.  MRSA PCR negative.  Chest x ray with progressive Pulmonary edema infiltrates. IV lasix BID ordered. Change antibiotics to Zosyn.  Patient more tachypnea will put her on BPAP.  Monitor closely.   Acidosis, respiratory and metabolic; Plan to monitor Bmet.  Received amp IV bicarb on 10/02.  Sodium bicarb tablet started, change to BID CCM consulted.  Resolved.   Acute on chronic Hypoxic  Respiratory failure ABG with acidosis hypercapnic, hypoxic  CCM think blood from ABG result likely venous.  Plan to continue with 3 L oxygen supplementation. Chronic use.  Patient is more alert today. Received IV lasix 10/02. Started on antibiotics to cover for PNA>  Plan to repeat Chest x ray.  Addendum; more sleepy, Tachypnea. Will  order BIPAP. CCM reconsulted  Hyperkalemia; Received IV lasix, and Veltassa.  Resolving.   Failure to thrive;  In setting acute illness and malignancy, anemia.   Myelodysplastic syndrome -Received 2 unit PRBC 10/01 for hb at 6. Hb increased to 9 post transfusion.  Hb down to 7.8. monitor.   Start Folic acid.   AKI on CKD stage 3a, GFR 45-59 ml/min (HCC) Appears to have CKD stage III A. -Monitor renal function -Avoid nephrotoxins Nephrology following.  Monitor on lasix.    Nausea vomiting.  Resolved.   Emphysema: Stable.   CMML, Pancytopenia, Anemia , pancytopenia SP 2 units PRBC 10/01 Follows with Dr Elbert Ewings.  -08-01-2023 pt given a dose of Aranesp as recommended by oncology   CAD: S/p recent NSTEMI(07-12-2023), which was managed medically. No chest pain and troponin currently negative -Continue home aspirin, statin, metoprolol and Imdur   Nonrheumatic aortic valve stenosis  Follow out patient cardiology  Malnutrition moderate degree On supplement.   Generalized  muscle weakness Multifactorial.   HTN: on metoprolol and imdur.   Hypothyroidism; on synthroid.   Gout: stable.   Abdominal aortic aneurysm CT abdomen with abdominal aneurysm of 5.1 cm -Need 16-month follow-up -Outpatient vascular surgery evaluation      Adenocarcinoma of left lung (HCC) Followed by oncology as an outpatient.   Invasive ductal carcinoma of left breast (HCC) History of lung cancer History of CMML Patient with multiple malignancies.  LDH elevated -Being managed by oncology as outpatient -Patient gets regular colony-stimulating and erythropoietin stimulating agents by oncology -Palliative care consulted    Nutrition Problem: Moderate Malnutrition Etiology: chronic illness    Signs/Symptoms: moderate fat depletion, moderate muscle depletion, energy intake < or equal to 50% for > or equal to 1 month    Interventions: Ensure Enlive (each supplement provides 350kcal and 20 grams of protein), Magic cup, Liberalize Diet  Estimated body mass index is 26.87 kg/m as calculated from the following:   Height as of this encounter: 5\' 3"  (1.6 m).   Weight as of this encounter: 68.8 kg.   DVT prophylaxis: Lovenox Code Status: Full code Family Communication: grand  son over phone 10/03 and 10/04 Disposition Plan:  Status is: Inpatient Remains inpatient appropriate because: management, AMS, hyponatremia    Consultants:  CCM Nephrology Palliative care  Procedures:    Antimicrobials:    Subjective: Patient seen, she wake up , answer questions. She is very hard of hearing. She denies dyspnea. Cough better.  She hasn't been able to provide sample for sputum culture.  I saw patient again this afternoon. Her Oxygen sat drop to 88 % on 3 L. She is alert, she has been coughing. She denies worsening dyspnea.   Objective: Vitals:   08/05/23 2306 08/06/23 0347 08/06/23 0822 08/06/23 1132  BP: 122/60 (!) 128/57 (!) 135/58 (!) 124/52  Pulse: 71 74 74 69  Resp: 20 20 20 20   Temp: 99.7 F (37.6 C) 99.1 F (37.3 C) 99.7 F (37.6 C) 98 F (36.7 C)  TempSrc: Oral Oral Axillary Oral  SpO2: 100% 97% 97% 96%  Weight:  68.8 kg    Height:        Intake/Output Summary (Last 24 hours) at 08/06/2023 1303 Last data filed at 08/05/2023 1715 Gross per 24 hour  Intake 240 ml  Output --  Net 240 ml   Filed Weights   08/03/23 0500 08/05/23 0610 08/06/23 0347  Weight: 73.5 kg 70.4 kg 68.8 kg    Examination:  General exam:NAD Respiratory system:  Normal Respiratory effort. Mild crackles.  Cardiovascular system: S 1, S 2 RRR Gastrointestinal system: BS present, soft, nt Central nervous system: alert, follows command. Moves all 4 extremities  Extremities: no edema  Data Reviewed: I have personally reviewed following labs and imaging studies  CBC: Recent Labs  Lab 07/31/23 2249 08/01/23 1005 08/02/23 0433 08/03/23 0443 08/04/23 0439 08/06/23 0409  WBC 2.8* 3.1* 3.5* 3.7* 3.3* 3.8*  NEUTROABS 0.2*  --  0.2* 0.2* 0.2*  --   HGB 7.8* 7.6* 7.0* 6.9* 9.9* 7.8*  HCT 25.6* 25.0* 23.5* 22.9* 33.4* 27.0*  MCV 100.8* 99.2 101.7* 100.4* 97.9 101.5*  PLT 118* 108* 119* 116* 112* 108*   Basic Metabolic Panel: Recent Labs  Lab 08/03/23 0443  08/03/23 1417 08/04/23 0439 08/04/23 1413 08/04/23 1810 08/04/23 2158 08/05/23 0343 08/05/23 1021 08/06/23 0409  NA 119*   < > 121* 128* 127* 129* 131* 132* 131*  K 5.3*  --  5.2* 4.3  --   --  4.3  --  4.5  CL 91*  --  92* 90*  --   --  94*  --  93*  CO2 23  --  16* 28  --   --  28  --  29  GLUCOSE 83  --  89 112*  --   --  85  --  101*  BUN 12  --  19 19  --   --  18  --  18  CREATININE 1.34*  --  1.32* 1.23*  --   --  1.17*  --  1.04*  CALCIUM 7.9*  --  8.4* 8.8*  --   --  8.7*  --  8.5*   < > = values in this interval not displayed.   GFR: Estimated Creatinine Clearance: 35.5 mL/min (A) (by C-G formula based on SCr of 1.04 mg/dL (H)). Liver Function Tests: Recent Labs  Lab 07/31/23 2249  AST 32  ALT 12  ALKPHOS 45  BILITOT 0.5  PROT 8.7*  ALBUMIN 3.3*   Recent Labs  Lab 07/31/23 2249  LIPASE 49   No results for input(s): "AMMONIA" in the last 168 hours. Coagulation Profile: Recent Labs  Lab 07/31/23 2249 08/01/23 0729  INR 1.4* 1.3*   Cardiac Enzymes: No results for input(s): "CKTOTAL", "CKMB", "CKMBINDEX", "TROPONINI" in the last 168 hours. BNP (last 3 results) No results for input(s): "PROBNP" in the last 8760 hours. HbA1C: No results for input(s): "HGBA1C" in the last 72 hours. CBG: Recent Labs  Lab 08/03/23 1116 08/03/23 1552 08/04/23 0715 08/05/23 0557 08/06/23 0614  GLUCAP 83 103* 86 91 89   Lipid Profile: No results for input(s): "CHOL", "HDL", "LDLCALC", "TRIG", "CHOLHDL", "LDLDIRECT" in the last 72 hours. Thyroid Function Tests: No results for input(s): "TSH", "T4TOTAL", "FREET4", "T3FREE", "THYROIDAB" in the last 72 hours.  Anemia Panel: No results for input(s): "VITAMINB12", "FOLATE", "FERRITIN", "TIBC", "IRON", "RETICCTPCT" in the last 72 hours. Sepsis Labs: No results for input(s): "PROCALCITON", "LATICACIDVEN" in the last 168 hours.  Recent Results (from the past 240 hour(s))  Resp panel by RT-PCR (RSV, Flu A&B, Covid) Anterior  Nasal Swab     Status: None   Collection Time: 07/31/23 11:45 PM   Specimen: Anterior Nasal Swab  Result Value Ref Range Status   SARS Coronavirus 2 by RT PCR NEGATIVE NEGATIVE Final   Influenza A by PCR NEGATIVE NEGATIVE Final   Influenza B by PCR NEGATIVE NEGATIVE Final    Comment: (NOTE) The Xpert Xpress SARS-CoV-2/FLU/RSV plus  assay is intended as an aid in the diagnosis of influenza from Nasopharyngeal swab specimens and should not be used as a sole basis for treatment. Nasal washings and aspirates are unacceptable for Xpert Xpress SARS-CoV-2/FLU/RSV testing.  Fact Sheet for Patients: BloggerCourse.com  Fact Sheet for Healthcare Providers: SeriousBroker.it  This test is not yet approved or cleared by the [place] FDA and has been authorized for detection and/or diagnosis of SARS-CoV-2 by FDA under an Emergency Use Authorization (EUA). This EUA will remain in effect (meaning this test can be used) for the duration of the COVID-19 declaration under Section 564(b)(1) of the Act, 21 U.S.C. section 360bbb-3(b)(1), unless the authorization is terminated or revoked.     Resp Syncytial Virus by PCR NEGATIVE NEGATIVE Final    Comment: (NOTE) Fact Sheet for Patients: BloggerCourse.com  Fact Sheet for Healthcare Providers: SeriousBroker.it  This test is not yet approved or cleared by the [place] FDA and has been authorized for detection and/or diagnosis of SARS-CoV-2 by FDA under an Emergency Use Authorization (EUA). This EUA will remain in effect (meaning this test can be used) for the duration of the COVID-19 declaration under Section 564(b)(1) of the Act, 21 U.S.C. section 360bbb-3(b)(1), unless the authorization is terminated or revoked.  Performed at [facility], [address] [place], [place] [address]   Blood culture (routine x 2)     Status: None    Collection Time: 07/31/23 11:52 PM   Specimen: BLOOD  Result Value Ref Range Status   Specimen Description BLOOD RIGHT ANTECUBITAL  Final   Special Requests   Final    BOTTLES DRAWN AEROBIC AND ANAEROBIC Blood Culture adequate volume   Culture   Final    NO GROWTH 5 DAYS Performed at [facility], [address] [place], [place] [address]    Report Status 08/06/2023 FINAL  Final  Blood culture (routine x 2)     Status: None   Collection Time: 08/01/23 12:49 AM   Specimen: BLOOD LEFT FOREARM  Result Value Ref Range Status   Specimen Description BLOOD LEFT FOREARM  Final   Special Requests   Final    BOTTLES DRAWN AEROBIC AND ANAEROBIC Blood Culture adequate volume   Culture   Final    NO GROWTH 5 DAYS Performed at [facility], [address], [place], [place] [address]    Report Status 08/06/2023 FINAL  Final  MRSA Next Gen by PCR, Nasal     Status: None   Collection Time: 08/05/23  3:56 PM   Specimen: Nasal Mucosa; Nasal Swab  Result Value Ref Range Status   MRSA by PCR Next Gen NOT DETECTED NOT DETECTED Final    Comment: (NOTE) The GeneXpert MRSA Assay (FDA approved for NASAL specimens only), is one component of a comprehensive MRSA colonization surveillance program. It is not intended to diagnose MRSA infection nor to guide or monitor treatment for MRSA infections. Test performance is not FDA approved in patients less than 64 years old. Performed at [facility], [address], [place], [place] [address]          Radiology Studies: No results found.      Scheduled Meds:  aspirin EC  81 mg Oral Daily   azithromycin  500 mg Oral Daily   enoxaparin (LOVENOX) injection  30 mg Subcutaneous Q24H   feeding supplement (NEPRO CARB STEADY)  237 mL Oral BID BM   fluticasone furoate-vilanterol  1 puff Inhalation Daily  And   umeclidinium bromide  1 puff Inhalation Daily   [START ON 08/07/2023] influenza vaccine adjuvanted  0.5 mL Intramuscular  Tomorrow-1000   isosorbide mononitrate  60 mg Oral Daily   levothyroxine  100 mcg Oral Q0600   magnesium oxide  200 mg Oral QHS   melatonin  3 mg Oral QHS   metoprolol succinate  25 mg Oral Daily   multivitamin with minerals  1 tablet Oral Daily   pravastatin  40 mg Oral QPM   psyllium  1 packet Oral Daily   senna  1 tablet Oral QHS   sodium bicarbonate  650 mg Oral BID   Continuous Infusions:  cefTRIAXone (ROCEPHIN)  IV 2 g (08/05/23 1653)     LOS: 5 days    Time spent: 35 minutes    Syla Devoss A Monroe Toure, MD [facility]   If 7PM-7AM, please contact night-coverage [email or web address]  08/06/2023, 1:03 PM

## 2023-08-06 NOTE — Progress Notes (Signed)
Inpatient Rehab Admissions Coordinator:   I met with patient at bedside this AM to discuss referral for CIR from Dr. Sunnie Nielsen per family request.  She was sleeping lightly when I entered and aroused to voice.  We discussed pt/family desire for IPR admission prior to discharge home and I reiterated Physiatry oversight, PT/OT 3 hrs/day with planned d/c home.  Pt stated both that she wasn't sure about 3 hrs/day of therapy and that she wanted to do rehab and get better.  Palliative has been following this admission and has determined pt clear goal to improve.    Pt has been with our rehab facility in the past (05/28/23 through 06/11/23) and was min/mod assist with ADLs and mod assist with mobility on admission.   She participated in 15 hrs over 7 days of combined therapies.  She improved to a supervision level with RW for mobility and ADLs during her admission.   Pt has a PMH of chronic HFpEF, CKD III, PAH, lung adenocarcinoma, CML, moderate AS, AAA, NSTEMI, TIA, and PAF.  She was admitted to [facility] on 9/28 with worsening SOB, weakness, and decreased PO over the preceding days.  Her hospital course has been notable for ongoing metabolic derangements (particularly hyperkalemia and hyponatremia), encephalopathy, and respiratory distress as demonstrated by increasing O2 requirements.  She is currently requiring BiPAP as of 10/4.    I spoke extensively with pt's grandson, Dr. Georgiana Shore, regarding post-acute rehab plan.  I let him know that we do not expect to have any beds available on our rehab unit at [facility] until sometime next week, as we are currently working through a backlog of patients.  I do believe she meets many criteria for an acute inpatient rehab program.  She has medical complexity, she has excellent family support, she would benefit from a coordinated team approach to her rehabilitation, and she has done well with inpatient rehab in the past.  I shared with him that my one concern was whether she  could physiologically tolerate the intensity of a 3hr/day program, as demonstrated by her increasing O2 needs and change from ambulating 30' with min assist on 10/1, to performing standing EOB with mod assist on 10/4.  He reiterated that her repeat cxr on 10/4 showed progressive pulmonary edema.  Per documentation, it appears that bipap has been ordered due to increased labored breathing.    We discussed that, given medical events, it would be reasonable to reassess how she is doing for potential d/c directly home next week, once things have stabilized and only if she is close to her baseline. If she continues to not be at baseline, they will want to continue to pursue CIR.  At this time, pt/family are agreeable to referrals to other area AIRs [facility], [facility], and [facility]) to see if they are able to offer a bed and admit Ms. Krull before [employer] could.    I will continue to follow and be available if needed.   Estill Dooms, PT, DPT Admissions Coordinator [phone] 08/06/23  5:42 PM

## 2023-08-06 NOTE — Progress Notes (Signed)
Pharmacy Antibiotic Note  Lisa [name] is a 89 y.o. female admitted on 07/31/2023 with weakness and shortness of breath.  Patient was initially started on Rocephin and Azithro for CAP.  Clinically worsening and MD would like to broaden antibiotics.  Pharmacy has been consulted for Zosyn dosing.  CrCl >20, will start extended infusion Zosyn.  Plan: Zosyn 3.375g IV q8h (4 hour infusion).  Height: 5\' 3"  (160 cm) Weight: 68.8 kg (151 lb 10.8 oz) IBW/kg (Calculated) : 52.4  Temp (24hrs), Avg:98.8 F (37.1 C), Min:98 F (36.7 C), Max:99.7 F (37.6 C)  Recent Labs  Lab 08/01/23 1005 08/01/23 1126 08/02/23 0433 08/02/23 0919 08/03/23 0443 08/04/23 0439 08/04/23 1413 08/05/23 0343 08/06/23 0409  WBC 3.1*  --  3.5*  --  3.7* 3.3*  --   --  3.8*  CREATININE 0.95   < > 1.17*   < > 1.34* 1.32* 1.23* 1.17* 1.04*   < > = values in this interval not displayed.    Estimated Creatinine Clearance: 35.5 mL/min (A) (by C-G formula based on SCr of 1.04 mg/dL (H)).    Allergies  Allergen Reactions   Meloxicam Other (See Comments)    Caused an injury to the kidneys, per nephrologist   Micardis Hct [Telmisartan-Hctz] Other (See Comments)    Caused an injury to the kidneys, per nephrologist    Antimicrobials this admission: CTX 10/2 >> 10/4 Azithro 10/3 >> (10/7) Zosyn 10/4 >>  Dose adjustments this admission:   Microbiology results: 9/29 BCx: neg 10/3 MRSA PCR: neg  Thank you for allowing pharmacy to be a part of this patient's care.  Brandon Melnick 08/06/2023 3:41 PM

## 2023-08-06 NOTE — Progress Notes (Signed)
NAME:  Lisa [name], MRN:  [id number], DOB:  [birth year], LOS: 5 ADMISSION DATE:  07/31/2023, CONSULTATION DATE:  10/2 REFERRING MD:  Dr. Sunnie Nielsen, CHIEF COMPLAINT:   hyponatremia  History of Present Illness:  89 year old female with PMH as below, which is significant for AAA, lung Ca, CML, HFpEF, CKD, PAH, Asthma, PAF not on AC, and CAD.  She wears 2L of oxygen chronically. She was in her usual state of health until 9/29 when she presented to [facility] ED with complaints of SOB, weakness, and poor PO intake. Multiple recent hospitalizations. Son reports progressive decline in her functional status. Upon arrival to ED she was hemodynamically stable. She was found to have hyponatremia to 118. She was admitted to this hospitalist service and nephrology was consulted for the sodium. Initially she was treated with IV fluids, but sodium worsened. A dose of tolvaptan was given with some improvement, but sodium then dipped again. In the AM hours of 10/2 she was more lethargic and PCCM was asked to evaluate for possible ICU transfer.   Pertinent  Medical History   has a past medical history of AAA (abdominal aortic aneurysm) (HCC), Adenocarcinoma of left lung (HCC) (2006), AF (paroxysmal atrial fibrillation) (HCC), Aortic stenosis, Arthritis, Asthma, Cancer of breast, female (HCC), Cancer of lung (HCC), Dysrhythmia, Hypertension, Hypothyroidism, Invasive ductal carcinoma of left breast (HCC) (1999), Mitral regurgitation, Myocardial infarction (HCC), Neutropenia (HCC) (06/09/2016), NSTEMI (non-ST elevated myocardial infarction) (HCC) (04/06/2023), Personal history of radiation therapy, Pulmonary hypertension (HCC), and TIA (transient ischemic attack) (02/10/2022).   Significant Hospital Events: Including procedures, antibiotic start and stop dates in addition to other pertinent events   9/29 admit for hyponatremia  Interim History / Subjective:  PCCM called back on 10/4 for respiratory distress and  worsening oxygen requirement.  Family reports patient was doing well when they got there at around 3:00, however, shortly after eating something she developed shortness of breath and became less responsive.  Required escalation of oxygen to 4 L and was very tachypneic.  Objective   Blood pressure (!) 124/57, pulse 70, temperature 98.9 F (37.2 C), temperature source Oral, resp. rate 20, height 5\' 3"  (1.6 m), weight 68.8 kg, SpO2 91%.       No intake or output data in the 24 hours ending 08/06/23 1759  Filed Weights   08/03/23 0500 08/05/23 0610 08/06/23 0347  Weight: 73.5 kg 70.4 kg 68.8 kg    Examination: Elderly female in mild to moderate respiratory distress which improved over the duration of my evaluation Regular rate and rhythm, no JVD appreciated, no edema Diminished bases respiratory rate 30s down to teens Abdomen soft, nontender, nondistended Extremities warm and dry Alert, oriented to person and location  Resolved Hospital Problem list     Assessment & Plan:    Acute on chronic hypoxemic respiratory failure COPD without acute exacerbation Symptoms on 10/4 concerning for aspiration Continue bronchodilators SLP evaluation to assess for aspiration Lasix x 1 given just prior to my evaluation Zosyn and azithromycin per primary  Hyponatremia: difficult to get a sense of her volume status. She did not improve with volume. Parts of her exam look volume down, and others seem volume up. Complicated case. Nephrology following Sodium is improved  Failure to thrive Palliative care now involved   Remainder per primary  Best Practice (right click and "Reselect all SmartList Selections" daily)   Per primary  Labs   CBC: Recent Labs  Lab 07/31/23 2249 08/01/23 1005 08/02/23 0433 08/03/23 1914  08/04/23 0439 08/06/23 0409  WBC 2.8* 3.1* 3.5* 3.7* 3.3* 3.8*  NEUTROABS 0.2*  --  0.2* 0.2* 0.2*  --   HGB 7.8* 7.6* 7.0* 6.9* 9.9* 7.8*  HCT 25.6* 25.0* 23.5* 22.9*  33.4* 27.0*  MCV 100.8* 99.2 101.7* 100.4* 97.9 101.5*  PLT 118* 108* 119* 116* 112* 108*    Basic Metabolic Panel: Recent Labs  Lab 08/03/23 0443 08/03/23 1417 08/04/23 0439 08/04/23 1413 08/04/23 1810 08/04/23 2158 08/05/23 0343 08/05/23 1021 08/06/23 0409  NA 119*   < > 121* 128* 127* 129* 131* 132* 131*  K 5.3*  --  5.2* 4.3  --   --  4.3  --  4.5  CL 91*  --  92* 90*  --   --  94*  --  93*  CO2 23  --  16* 28  --   --  28  --  29  GLUCOSE 83  --  89 112*  --   --  85  --  101*  BUN 12  --  19 19  --   --  18  --  18  CREATININE 1.34*  --  1.32* 1.23*  --   --  1.17*  --  1.04*  CALCIUM 7.9*  --  8.4* 8.8*  --   --  8.7*  --  8.5*   < > = values in this interval not displayed.   GFR: Estimated Creatinine Clearance: 35.5 mL/min (A) (by C-G formula based on SCr of 1.04 mg/dL (H)). Recent Labs  Lab 08/02/23 0433 08/03/23 0443 08/04/23 0439 08/06/23 0409  WBC 3.5* 3.7* 3.3* 3.8*    Liver Function Tests: Recent Labs  Lab 07/31/23 2249  AST 32  ALT 12  ALKPHOS 45  BILITOT 0.5  PROT 8.7*  ALBUMIN 3.3*   Recent Labs  Lab 07/31/23 2249  LIPASE 49   No results for input(s): "AMMONIA" in the last 168 hours.  ABG    Component Value Date/Time   PHART 7.26 (L) 08/04/2023 1112   PCO2ART 56 (H) 08/04/2023 1112   PO2ART 31 (LL) 08/04/2023 1112   HCO3 25.1 08/04/2023 1112   ACIDBASEDEF 2.8 (H) 08/04/2023 1112   O2SAT 51.4 08/04/2023 1112     Coagulation Profile: Recent Labs  Lab 07/31/23 2249 08/01/23 0729  INR 1.4* 1.3*    Cardiac Enzymes: No results for input(s): "CKTOTAL", "CKMB", "CKMBINDEX", "TROPONINI" in the last 168 hours.  HbA1C: Hgb A1c MFr Bld  Date/Time Value Ref Range Status  02/11/2022 05:05 AM 4.4 (L) 4.8 - 5.6 % Final    Comment:    (NOTE) Pre diabetes:          5.7%-6.4%  Diabetes:              >6.4%  Glycemic control for   <7.0% adults with diabetes     CBG: Recent Labs  Lab 08/03/23 1116 08/03/23 1552 08/04/23 0715  08/05/23 0557 08/06/23 0614  GLUCAP 83 103* 86 91 89     Joneen Roach, AGACNP-BC [facility]  See Amion for personal pager PCCM on call pager [phone] until 7pm. Please call Elink 7p-7a. [phone]  08/06/2023 6:05 PM

## 2023-08-06 NOTE — TOC Transition Note (Signed)
Transition of Care [facility]) - CM/SW Discharge Note   Patient Details  Name: Lisa [name] MRN: [id number] Date of Birth: 11-Sep-1934  [facility]) CM/SW Contact:  Eduard Roux, LCSW Phone Number: 08/06/2023, 12:49 PM   Clinical Narrative:     CSW spoke with patient's daughter, Steward Drone. CSW introduced self and explained role. Patient is in the home with daughter. She states family is requesting CIR for patient. CSW explained recommendations is for SNF.  Daughter explained her son, Shawnika Pepin, MD, is patient's HCPOA and requested CSW give him a call.   CSW spoke with Frederik Schmidt, introduced self and explained the reason for the call. Informed of recommendations for short term rehab at SNF. He explained he talked with PT yesterday and was advised the recommendations would be reconsidered. CSW shared current recommendations remains SNF. He believes CIR is best for the patient and expressed frustration with what was told to the family and the actual recommendations. CSW advised will reach out to rehab & PT and follow up.   CSW updated CIR, RNCM & PT by secure chat.  CIR & PT will follow up and discuss recommendations w/ grandson.  CSW called grandson, Frederik Schmidt to advised to expect call from Rehab coordinator.  TOC will continue to follow and assist with discharge planning.   Antony Blackbird, MSW, LCSW Clinical Social Worker       Barriers to Discharge: Continued Medical Work up   Patient Goals and CMS Choice      Discharge Placement                         Discharge Plan and Services Additional resources added to the After Visit Summary for   In-house Referral: Clinical Social Work                                   Social Determinants of Health (SDOH) Interventions SDOH Screenings   Food Insecurity: No Food Insecurity (08/03/2023)  Housing: Low Risk  (08/03/2023)  Transportation Needs: No Transportation Needs (08/03/2023)  Utilities: Not At Risk  (08/03/2023)  Depression (PHQ2-9): Low Risk  (12/12/2020)  Physical Activity: Inactive (12/12/2020)  Tobacco Use: Medium Risk (08/01/2023)     Readmission Risk Interventions    08/03/2023   11:15 AM 07/13/2023   11:15 AM 05/24/2023    2:10 PM  Readmission Risk Prevention Plan  Transportation Screening Complete Complete Complete  HRI or Home Care Consult   Complete  Social Work Consult for Recovery Care Planning/Counseling   Complete  Palliative Care Screening   Complete  Medication Review [facility]) Referral to Pharmacy Complete Complete  PCP or Specialist appointment within 3-5 days of discharge Complete    HRI or Home Care Consult Complete Complete   SW Recovery Care/Counseling Consult Complete Complete   Palliative Care Screening Not Applicable Not Applicable   Skilled Nursing Facility Not Applicable Not Applicable

## 2023-08-06 NOTE — Progress Notes (Signed)
[facility] Progress Note  Name: Lisa [name] MRN: [id number] DOB: 1934/05/16  Chief Complaint:  Altered mental status/confusion   Subjective:  Strict ins/outs are not available.  She had 700 mL UOP over 10/3 charted.  Met critical care at the bedside on my rounds.  She had respiratory distress which sounds like was right after eating.  Her family is at bedside and her daughter called the patient's grandson (Dr. Richardine Service) via speaker phone.  She has been turned up to 6 liters oxygen   Review of systems:        She has had shortness of breath No n/v  Denies chest pain  Confusion per family  ------------ Background on referral:  Lisa [name] is a/an 89 y.o. female with a past medical history of HFpEF, CKD, pulmonary hypertension, aortic stenosis, CAD, atrial fibrillation, CML, COPD who presents with confusion.  Because of the patient's confusion history was largely obtained from her daughter at the bedside.  As well as chart review.   Patient was discharged from the hospital on 9/12 after being hospitalized with shortness of breath and NSTEMI that was medically managed.  The daughter states that the patient has become more confused over the past 2 to 3 days.  She is predominantly agitated at night wandering around and hard to redirect.  She frequently takes off her oxygen.  Possibly some shortness of breath particularly when her oxygen is removed.  She also has had very poor p.o. intake for about a week.  Last night had some nausea and vomiting as well as this morning.  Denies fevers and chills.  Possibly having some abdominal pain.  Frequently saying she has to urinate.  Also may have had some blood in her urine yesterday.  Patient's daughter says she tends to drink a lot of water throughout the day.  She was brought in the emergency department.  Urinalysis was normal.  She had a sodium of 118 and potassium 5.5.  She was given [facility] and started on IV fluids.  Urine lytes  demonstrated osmolality of 170 and urine sodium of 29.   She had a similar hospitalization in July that mostly improved with IV fluids and was thought to be related to poor solute intake.  No intake or output data in the 24 hours ending 08/06/23 1722   Vitals:  Vitals:   08/06/23 0822 08/06/23 1132 08/06/23 1300 08/06/23 1503  BP: (!) 135/58 (!) 124/52  (!) 124/57  Pulse: 74 69  70  Resp: 20 20 20 20   Temp: 99.7 F (37.6 C) 98 F (36.7 C)  98.9 F (37.2 C)  TempSrc: Axillary Oral  Oral  SpO2: 97% 96% 97% 91%  Weight:      Height:         Physical Exam:      General elderly female in bed on 6 liters oxygen HEENT normocephalic atraumatic extraocular movements intact sclera anicteric Neck supple trachea midline Lungs reduced breath sounds and increased work of breathing with exertion  Heart S1S2 no rub Abdomen soft nontender nondistended Extremities no edema  Psych normal mood and affect Neuro - she wakens with the course of the interview. Recognizes me. Recognizes family. She does have difficulty hearing   Medications reviewed   Labs:     Latest Ref Rng & Units 08/06/2023    4:09 AM 08/05/2023   10:21 AM 08/05/2023    3:43 AM  BMP  Glucose 70 - 99 mg/dL 914   85  BUN 8 - 23 mg/dL 18   18   Creatinine 7.82 - 1.00 mg/dL 9.56   2.13   Sodium 086 - 145 mmol/L 131  132  131   Potassium 3.5 - 5.1 mmol/L 4.5   4.3   Chloride 98 - 111 mmol/L 93   94   CO2 22 - 32 mmol/L 29   28   Calcium 8.9 - 10.3 mg/dL 8.5   8.7      Assessment/Plan:   Cindy Montross is a/an 89 y.o. female with a past medical history HFpEF, CKD, pulmonary hypertension, aortic stenosis, CAD, atrial fibrillation, CML, COPD who present w/ symptomatic hyponatremia    # Acute hypoxic resp failure  - concern for aspiration and/or overload - pulmonary is at the bedside to evaluate    # Symptomatic hyponatremia:  Mixed picture but ultimately with fluid overload.  Could have some underlying SIADH as well,  note remeron use.  S/p tolvaptan on 10/1 without much impact.  Fluids are currently off - sodium declined with hydration.  Labs are unchanged with tolvaptan but much improved with lasix.  - Much improved with lasix   - lasix as above - She would benefit from scheduled or PRN lasix on discharge - not any lasix on her home med list.   - Set fluid restriction of 1.5 liters  - BMP daily - I have stopped her remeron which may also have been contributing.  Would not restart   Hyperkalemia:  - improved with lasix  - Changed to low potassium diet    Pulmonary hypertension/recent NSTEMI/CHF:  lasix as above   Chronic hypoxic respiratory failure/COPD: Management per primary team.  Optimize volume status as above    Aortic stenosis - noted as moderate to severe per 07/2023 Echo - per primary team   Failure to thrive: underlying issue.  Appreciate help from palliative care   Anemia: Multifactorial related to malignancy.  On home ESA; PRBC's per primary team (got PRBC's on 10/1).    CML - per primary team   Disposition - per primary team    [name], MD 5:47 PM 08/06/2023

## 2023-08-06 NOTE — Progress Notes (Signed)
Physical Therapy Treatment Patient Details Name: Lisa [name] MRN: [id number] DOB: May 08, 1934 Today's Date: 08/06/2023   History of Present Illness 89 y.o. female presents to [facility] hospital on 07/31/2023 with SOB, weakness and poor PO intake. Pt found to be hyponatremic. Pt received 2 units PRBC on 10/1 AM. PMH includes chronic HFpEF, CKD III, PAH, lung adenocarcinoma, CML, moderate AS, AAA, NSTEMI, TIA, PAF.    PT Comments  Pt received in bed, sleeping soundly. Easily roused and conversant with therapist but remained groggy. She required mod assist bed mobility, and mod assist sit to stand with RW. Standing with RW attempted x 3. Pt maintaining flexed posture and unable to progress steps for amb or transfer to recliner. Pt very fatigued following standing trials and assisted back to bed. SpO2 90-94% on 3L. Current POC remains appropriate. Upon d/c, pt would benefit from low intensity therapy program < 3 hours/day.    If plan is discharge home, recommend the following: A little help with walking and/or transfers;A little help with bathing/dressing/bathroom;Assistance with cooking/housework;Direct supervision/assist for medications management;Direct supervision/assist for financial management;Help with stairs or ramp for entrance;Assist for transportation   Can travel by private vehicle     Yes  Equipment Recommendations  None recommended by PT    Recommendations for Other Services       Precautions / Restrictions Precautions Precautions: Fall;Other (comment) Precaution Comments: monitor SpO2     Mobility  Bed Mobility Overal bed mobility: Needs Assistance Bed Mobility: Supine to Sit, Sit to Supine, Rolling Rolling: Mod assist   Supine to sit: Mod assist Sit to supine: Mod assist   General bed mobility comments: increased time, cues for sequencing    Transfers Overall transfer level: Needs assistance Equipment used: Rolling walker (2 wheels) Transfers: Sit to/from Stand Sit to  Stand: Mod assist, From elevated surface           General transfer comment: STS x 3 trials from elevated bed. Increased time and effort to power up. Pt maintaining flexed posture. Unable to take pivot steps to recliner.    Ambulation/Gait                   Stairs             Wheelchair Mobility     Tilt Bed    Modified Rankin (Stroke Patients Only)       Balance Overall balance assessment: Needs assistance Sitting-balance support: No upper extremity supported, Feet supported Sitting balance-Leahy Scale: Fair     Standing balance support: Reliant on assistive device for balance, Bilateral upper extremity supported, During functional activity Standing balance-Leahy Scale: Poor                              Cognition Arousal: Alert (but sleepy/groggy) Behavior During Therapy: WFL for tasks assessed/performed Overall Cognitive Status: History of cognitive impairments - at baseline                                 General Comments: Groggy. Slow to respond.        Exercises      General Comments General comments (skin integrity, edema, etc.): SpO2 90-94% on 3L      Pertinent Vitals/Pain Pain Assessment Pain Assessment: Faces Faces Pain Scale: Hurts a little bit Pain Location: generalized with mobility Pain Descriptors / Indicators: Discomfort, Grimacing Pain Intervention(s): Limited activity within  patient's tolerance, Monitored during session, Repositioned    Home Living                          Prior Function            PT Goals (current goals can now be found in the care plan section) Acute Rehab PT Goals Patient Stated Goal: not stated Progress towards PT goals: Progressing toward goals    Frequency    Min 1X/week      PT Plan      Co-evaluation              AM-PAC PT "6 Clicks" Mobility   Outcome Measure  Help needed turning from your back to your side while in a flat bed  without using bedrails?: A Lot Help needed moving from lying on your back to sitting on the side of a flat bed without using bedrails?: A Lot Help needed moving to and from a bed to a chair (including a wheelchair)?: A Lot Help needed standing up from a chair using your arms (e.g., wheelchair or bedside chair)?: A Lot Help needed to walk in hospital room?: Total Help needed climbing 3-5 steps with a railing? : Total 6 Click Score: 10    End of Session Equipment Utilized During Treatment: Gait belt;Oxygen Activity Tolerance: Patient limited by fatigue Patient left: in bed;with call bell/phone within reach;with bed alarm set Nurse Communication: Mobility status PT Visit Diagnosis: Other abnormalities of gait and mobility (R26.89);Muscle weakness (generalized) (M62.81)     Time: 4098-1191 PT Time Calculation (min) (ACUTE ONLY): 27 min  Charges:    $Therapeutic Activity: 23-37 mins PT General Charges $$ ACUTE PT VISIT: 1 Visit                     Ferd Glassing., PT  Office # [phone]    Ilda Foil 08/06/2023, 12:45 PM

## 2023-08-06 NOTE — Plan of Care (Signed)

## 2023-08-06 NOTE — Telephone Encounter (Signed)
Spoke with pt Grandson Dr. Georgiana Shore on the phone for 49 min concerning pt disposition.  Dr Richardine Service states that she has been to [facility] previously and made good progress. Pt was ambulating 60 feet with assistance on 07/14/23 in the hospital. He states that pt has good family support from his mother and uncle at home. Discussed pt will continue to be seen and assessed for disposition changes by physical therapy in the acute care hospital setting as able and appropriate.   Harrel Carina, DPT, CLT  Acute Rehabilitation Services Office: [phone] (Secure chat preferred)

## 2023-08-07 ENCOUNTER — Inpatient Hospital Stay (HOSPITAL_COMMUNITY): Payer: Medicare Other

## 2023-08-07 DIAGNOSIS — Z515 Encounter for palliative care: Secondary | ICD-10-CM | POA: Diagnosis not present

## 2023-08-07 DIAGNOSIS — Z7189 Other specified counseling: Secondary | ICD-10-CM | POA: Diagnosis not present

## 2023-08-07 LAB — CBC WITH DIFFERENTIAL/PLATELET
Abs Immature Granulocytes: 0 10*3/uL (ref 0.00–0.07)
Basophils Absolute: 0 10*3/uL (ref 0.0–0.1)
Basophils Relative: 0 %
Eosinophils Absolute: 0 10*3/uL (ref 0.0–0.5)
Eosinophils Relative: 0 %
HCT: 28.8 % — ABNORMAL LOW (ref 36.0–46.0)
Hemoglobin: 8.3 g/dL — ABNORMAL LOW (ref 12.0–15.0)
Lymphocytes Relative: 46 %
Lymphs Abs: 2.2 10*3/uL (ref 0.7–4.0)
MCH: 29.1 pg (ref 26.0–34.0)
MCHC: 28.8 g/dL — ABNORMAL LOW (ref 30.0–36.0)
MCV: 101.1 fL — ABNORMAL HIGH (ref 80.0–100.0)
Monocytes Absolute: 1.9 10*3/uL — ABNORMAL HIGH (ref 0.1–1.0)
Monocytes Relative: 41 %
Neutro Abs: 0.6 10*3/uL — ABNORMAL LOW (ref 1.7–7.7)
Neutrophils Relative %: 13 %
Platelets: 100 10*3/uL — ABNORMAL LOW (ref 150–400)
RBC: 2.85 MIL/uL — ABNORMAL LOW (ref 3.87–5.11)
RDW: 19.5 % — ABNORMAL HIGH (ref 11.5–15.5)
WBC: 4.7 10*3/uL (ref 4.0–10.5)
nRBC: 17 /100{WBCs} — ABNORMAL HIGH
nRBC: 4.9 % — ABNORMAL HIGH (ref 0.0–0.2)

## 2023-08-07 LAB — URINALYSIS, COMPLETE (UACMP) WITH MICROSCOPIC
Bacteria, UA: NONE SEEN
Bilirubin Urine: NEGATIVE
Glucose, UA: NEGATIVE mg/dL
Hgb urine dipstick: NEGATIVE
Ketones, ur: NEGATIVE mg/dL
Leukocytes,Ua: NEGATIVE
Nitrite: NEGATIVE
Protein, ur: NEGATIVE mg/dL
Specific Gravity, Urine: 1.014 (ref 1.005–1.030)
pH: 5 (ref 5.0–8.0)

## 2023-08-07 LAB — CBC
HCT: 30 % — ABNORMAL LOW (ref 36.0–46.0)
Hemoglobin: 8.9 g/dL — ABNORMAL LOW (ref 12.0–15.0)
MCH: 29.7 pg (ref 26.0–34.0)
MCHC: 29.7 g/dL — ABNORMAL LOW (ref 30.0–36.0)
MCV: 100 fL (ref 80.0–100.0)
Platelets: 109 10*3/uL — ABNORMAL LOW (ref 150–400)
RBC: 3 MIL/uL — ABNORMAL LOW (ref 3.87–5.11)
RDW: 19.5 % — ABNORMAL HIGH (ref 11.5–15.5)
WBC: 3.7 10*3/uL — ABNORMAL LOW (ref 4.0–10.5)
nRBC: 6 % — ABNORMAL HIGH (ref 0.0–0.2)

## 2023-08-07 LAB — BASIC METABOLIC PANEL
Anion gap: 11 (ref 5–15)
BUN: 20 mg/dL (ref 8–23)
CO2: 32 mmol/L (ref 22–32)
Calcium: 9 mg/dL (ref 8.9–10.3)
Chloride: 89 mmol/L — ABNORMAL LOW (ref 98–111)
Creatinine, Ser: 1.07 mg/dL — ABNORMAL HIGH (ref 0.44–1.00)
GFR, Estimated: 50 mL/min — ABNORMAL LOW (ref >60)
Glucose, Bld: 107 mg/dL — ABNORMAL HIGH (ref 70–99)
Potassium: 3.8 mmol/L (ref 3.5–5.1)
Sodium: 132 mmol/L — ABNORMAL LOW (ref 135–145)

## 2023-08-07 LAB — GLUCOSE, CAPILLARY
Glucose-Capillary: 91 mg/dL (ref 70–99)
Glucose-Capillary: 91 mg/dL (ref 70–99)

## 2023-08-07 LAB — LACTIC ACID, PLASMA: Lactic Acid, Venous: 0.9 mmol/L (ref 0.5–1.9)

## 2023-08-07 LAB — PROCALCITONIN: Procalcitonin: <0.1 ng/mL

## 2023-08-07 MED ORDER — ORAL CARE MOUTH RINSE
15.0000 mL | OROMUCOSAL | Status: DC
  Administered 2023-08-07 – 2023-08-16 (×32): 15 mL via OROMUCOSAL

## 2023-08-07 MED ORDER — CHLORHEXIDINE GLUCONATE CLOTH 2 % EX PADS
6.0000 | MEDICATED_PAD | Freq: Every day | CUTANEOUS | Status: DC
  Administered 2023-08-07 – 2023-08-16 (×10): 6 via TOPICAL

## 2023-08-07 MED ORDER — ORAL CARE MOUTH RINSE: 15.0000 mL | OROMUCOSAL | Status: DC | PRN

## 2023-08-07 MED ORDER — FUROSEMIDE 10 MG/ML IJ SOLN
40.0000 mg | Freq: Every day | INTRAMUSCULAR | Status: DC
  Administered 2023-08-08: 40 mg via INTRAVENOUS
  Filled 2023-08-07: qty 4

## 2023-08-07 MED ORDER — FUROSEMIDE 10 MG/ML IJ SOLN
40.0000 mg | Freq: Once | INTRAMUSCULAR | Status: AC
  Administered 2023-08-07: 40 mg via INTRAVENOUS
  Filled 2023-08-07: qty 4

## 2023-08-07 NOTE — Progress Notes (Signed)
[facility] Progress Note  Name: Lisa [name] MRN: [id number] DOB: 1934-09-30  Chief Complaint:  Altered mental status/confusion   Subjective:  She had 3.8 liters UOP over 10/4.  She had a CXR with mild imrovement in moderate CHF/signs of edema. As well as imaging which would correlate with aspiration.  She just got one dose of lasix yesterday.  Spoke with her daughter at bedside.  She is normally on 2 liters oxygen and is now on 4 liters oxygen.   Review of systems:  She feels like her breathing is "alright"  No n/v  Denies chest pain   ------------ Background on referral:  Lisa [name] is a/an 89 y.o. female with a past medical history of HFpEF, CKD, pulmonary hypertension, aortic stenosis, CAD, atrial fibrillation, CML, COPD who presents with confusion.  Because of the patient's confusion history was largely obtained from her daughter at the bedside.  As well as chart review.   Patient was discharged from the hospital on 9/12 after being hospitalized with shortness of breath and NSTEMI that was medically managed.  The daughter states that the patient has become more confused over the past 2 to 3 days.  She is predominantly agitated at night wandering around and hard to redirect.  She frequently takes off her oxygen.  Possibly some shortness of breath particularly when her oxygen is removed.  She also has had very poor p.o. intake for about a week.  Last night had some nausea and vomiting as well as this morning.  Denies fevers and chills.  Possibly having some abdominal pain.  Frequently saying she has to urinate.  Also may have had some blood in her urine yesterday.  Patient's daughter says she tends to drink a lot of water throughout the day.  She was brought in the emergency department.  Urinalysis was normal.  She had a sodium of 118 and potassium 5.5.  She was given [facility] and started on IV fluids.  Urine lytes demonstrated osmolality of 170 and urine sodium of 29.    She had a similar hospitalization in July that mostly improved with IV fluids and was thought to be related to poor solute intake.   Intake/Output Summary (Last 24 hours) at 08/07/2023 1401 Last data filed at 08/07/2023 1200 Gross per 24 hour  Intake 1030.86 ml  Output 3150 ml  Net -2119.14 ml     Vitals:  Vitals:   08/07/23 0739 08/07/23 1000 08/07/23 1116 08/07/23 1200  BP: (!) 114/44 (!) 127/53 (!) 111/48 (!) 107/50  Pulse: 72 72 65 65  Resp: (!) 22 17 (!) 22 18  Temp: 98.8 F (37.1 C)  98.2 F (36.8 C)   TempSrc: Oral  Oral   SpO2: 94% 90% 100% 92%  Weight:      Height:         Physical Exam:      General elderly female in bed on 4 liters oxygen HEENT normocephalic atraumatic extraocular movements intact sclera anicteric Neck supple trachea midline Lungs reduced breath sounds and basilar crackles on auscultation; increased work of breathing with exertion and normal work of breathing at rest Heart S1S2 no rub Abdomen soft nontender nondistended Extremities no edema  Psych normal mood and affect Neuro - asleep on arrival but wakes with exam; conversant; hard of hearing   Medications reviewed   Labs:     Latest Ref Rng & Units 08/07/2023    3:47 AM 08/06/2023    4:09 AM 08/05/2023   10:21 AM  BMP  Glucose 70 - 99 mg/dL 914  782    BUN 8 - 23 mg/dL 20  18    Creatinine 9.56 - 1.00 mg/dL 2.13  0.86    Sodium 578 - 145 mmol/L 132  131  132   Potassium 3.5 - 5.1 mmol/L 3.8  4.5    Chloride 98 - 111 mmol/L 89  93    CO2 22 - 32 mmol/L 32  29    Calcium 8.9 - 10.3 mg/dL 9.0  8.5       Assessment/Plan:   Lisa [name] is a/an 89 y.o. female with a past medical history HFpEF, CKD, pulmonary hypertension, aortic stenosis, CAD, atrial fibrillation, CML, COPD who present w/ symptomatic hyponatremia    # Acute hypoxic resp failure  - concern for aspiration, overload, infection or a combination of the three.  Improving with diuretics but also note she was febrile  overnight  - she is being treated with lasix  - abx per primary team  - note she is now on dysphagia diet  # Symptomatic hyponatremia:  Mixed picture but ultimately with fluid overload.  Could have some underlying SIADH as well, note remeron use.  S/p tolvaptan on 10/1 without much impact.  Fluids are currently off - sodium declined with hydration.  Labs were unchanged with tolvaptan but much improved with lasix.  - lasix 40 mg IV once now and ordered for daily for now - She would benefit from scheduled or PRN lasix on discharge - not any lasix on her home med list.   - Set fluid restriction of 1.5 liters  - BMP daily - I have stopped her remeron which may also have been contributing.  Would not restart   Hyperkalemia:  - improved with lasix  - she is limited to the dysphagia diet - follow on current diet    Pulmonary hypertension/recent NSTEMI/CHF:   - lasix as above   Chronic hypoxic respiratory failure/COPD: Management per primary team.  Optimize volume status as above    Aortic stenosis - noted as moderate to severe per 07/2023 Echo  Failure to thrive: underlying issue.  Appreciate help from palliative care   Anemia: Multifactorial related to malignancy.  On home ESA; PRBC's per primary team (got PRBC's on 10/1).  Slightly improved    CML - per primary team   Disposition - per primary team    [name], MD 2:18 PM 08/07/2023

## 2023-08-07 NOTE — Evaluation (Signed)
Clinical/Bedside Swallow Evaluation Patient Details  Name: Lisa [name] MRN: [id number] Date of Birth: 20-Dec-1933  Today's Date: 08/07/2023 Time: SLP Start Time (ACUTE ONLY): 0955 SLP Stop Time (ACUTE ONLY): 1010 SLP Time Calculation (min) (ACUTE ONLY): 15 min  Past Medical History:  Past Medical History:  Diagnosis Date   AAA (abdominal aortic aneurysm) (HCC)    Adenocarcinoma of left lung (HCC) 2006   AF (paroxysmal atrial fibrillation) (HCC)    Aortic stenosis    Arthritis    Asthma    Cancer of breast, female (HCC)    Cancer of lung (HCC)    Dysrhythmia    Hypertension    Hypothyroidism    Invasive ductal carcinoma of left breast (HCC) 1999   Mitral regurgitation    Myocardial infarction (HCC)    Neutropenia (HCC) 06/09/2016   NSTEMI (non-ST elevated myocardial infarction) (HCC) 04/06/2023   Personal history of radiation therapy    Pulmonary hypertension (HCC)    TIA (transient ischemic attack) 02/10/2022   Past Surgical History:  Past Surgical History:  Procedure Laterality Date   BIOPSY  10/23/2020   Procedure: BIOPSY;  Surgeon: Benancio Deeds, MD;  Location: [facility] ENDOSCOPY;  Service: Gastroenterology;;   BREAST BIOPSY     left axillary node dissection   BREAST LUMPECTOMY Left    COLONOSCOPY  03/08/2003   XBM:[id number] rectal polyps destroyed with the tip of the snare/Polyps at hepatic flexure, splenic flexure at 35 cm/Left-sided diverticula: unable to retrieve path   COLONOSCOPY  09/12/2008   UVO:[id number] rectum and distal sigmoid diminutive polyps/scattered left sided diverticulum. hyperplastic   COLONOSCOPY N/A 03/06/2013   IHK:[id number] polyp-removed as described above; colonic diverticulosis. hyperplastic polyps. next TCS 03/2018   ESOPHAGOGASTRODUODENOSCOPY (EGD) WITH PROPOFOL N/A 10/23/2020   Procedure: ESOPHAGOGASTRODUODENOSCOPY (EGD) WITH PROPOFOL;  Surgeon: Benancio Deeds, MD;  Location: [facility] ENDOSCOPY;  Service: Gastroenterology;  Laterality: N/A;    FOOT SURGERY     LUNG REMOVAL, PARTIAL     upper lobe   HPI:  Patient is an 89 y.o. female with PMH: COPD, chronic HFpEF, CKD III, PAH, lung adenocarcinoma, CML, moderate AS, AAA, NSTEMI, TIA, PAF. She presented to the hosptial on 08/01/23 with worsening SOB, worsening weakness and poor PO intake for a few days prior. She has had multiple recent hospitalizations for hypoxic failure, sepsis, NSTEMI over past couple of months. In ED, CTA chest was negative for PE, some mucous plugging in right lower lobe and a 16mm nodular density in the left upper lobe. She  was admittted with hyponatremia. SLP swallow evaluation ordered on 08/06/23 following incident of respiratory distress which family reported started shortly after patient had eaten something.    Assessment / Plan / Recommendation  Clinical Impression  Patient presents with clinical s/s of dysphagia as per this bedside swallow evaluation, however SLP suspects her swallow impairment is largely based on current state of lethargy and confusion. SLP was able to initiate short periods of alertness with patient and during these times, her swallow was assessed via PO's of thin liquids (water) and puree solids (applesauce). Patient required total assist with feeding. She exhibited poor awareness to liquid and solid boluses initially, however this improved with repeated trials. Swallow initiation with thin liquids appeared timely after a couple trials. No overt s/s aspiraiton and no oral residuals remained s/p swallows. SLP recommending initiate PO diet of Dys 1 (puree) solids, thin liquids with precautions to only feed PO's when patient is fully awake and alert. SLP will follow for toleration  and ability to advance. SLP Visit Diagnosis: Dysphagia, unspecified (R13.10)    Aspiration Risk  Mild aspiration risk    Diet Recommendation Dysphagia 1 (Puree);Thin liquid    Liquid Administration via: Cup;Straw Medication Administration: Whole meds with  puree Supervision: Full supervision/cueing for compensatory strategies;Staff to assist with self feeding Compensations: Slow rate;Small sips/bites;Minimize environmental distractions Postural Changes: Seated upright at 90 degrees    Other  Recommendations Oral Care Recommendations: Oral care BID;Oral care before and after PO;Staff/trained caregiver to provide oral care    Recommendations for follow up therapy are one component of a multi-disciplinary discharge planning process, led by the attending physician.  Recommendations may be updated based on patient status, additional functional criteria and insurance authorization.  Follow up Recommendations Follow physician's recommendations for discharge plan and follow up therapies      Assistance Recommended at Discharge    Functional Status Assessment Patient has had a recent decline in their functional status and demonstrates the ability to make significant improvements in function in a reasonable and predictable amount of time.  Frequency and Duration min 2x/week  1 week       Prognosis Prognosis for improved oropharyngeal function: Good      Swallow Study   General Date of Onset: 08/06/23 HPI: Patient is an 89 y.o. female with PMH: COPD, chronic HFpEF, CKD III, PAH, lung adenocarcinoma, CML, moderate AS, AAA, NSTEMI, TIA, PAF. She presented to the hosptial on 08/01/23 with worsening SOB, worsening weakness and poor PO intake for a few days prior. She has had multiple recent hospitalizations for hypoxic failure, sepsis, NSTEMI over past couple of months. In ED, CTA chest was negative for PE, some mucous plugging in right lower lobe and a 16mm nodular density in the left upper lobe. She  was admittted with hyponatremia. SLP swallow evaluation ordered on 08/06/23 following incident of respiratory distress which family reported started shortly after patient had eaten something. Type of Study: Bedside Swallow Evaluation Previous Swallow  Assessment: none found Diet Prior to this Study: NPO Temperature Spikes Noted: No Respiratory Status: Nasal cannula History of Recent Intubation: No Behavior/Cognition: Alert;Lethargic/Drowsy;Requires cueing Oral Cavity Assessment: Other (comment) (moist but with sticky secretions) Oral Care Completed by SLP: Other (Comment) (completed by RN) Self-Feeding Abilities: Total assist Patient Positioning: Upright in bed Baseline Vocal Quality: Low vocal intensity Volitional Cough: Cognitively unable to elicit Volitional Swallow: Unable to elicit    Oral/Motor/Sensory Function Overall Oral Motor/Sensory Function: Within functional limits   Ice Chips     Thin Liquid Thin Liquid: Impaired Oral Phase Impairments: Poor awareness of bolus    Nectar Thick     Honey Thick     Puree Puree: Impaired Oral Phase Functional Implications: Prolonged oral transit   Solid     Solid: Not tested     Angela Nevin, MA, CCC-SLP Speech Therapy

## 2023-08-07 NOTE — Progress Notes (Signed)
PROGRESS NOTE    Lisa [name]  AOZ:[id number] DOB: February 18, 1934 DOA: 07/31/2023 PCP: [name], MD   Brief Narrative: 89 year old with past medical history significant for chronic heart failure preserved ejection fraction, CKD stage IIIb, severe pulmonary hypertension, history of lung adenocarcinoma, MDS, CML, chronic neutropenia, moderate AAS mild MR, AAA, non-STEMI, TIA, paroxysmal A-fib not on anticoagulation, hypothyroidism, COPD with chronic hypoxia to 2-3 L of oxygen at baseline presents with worsening shortness of breath, worsening weakness and poor oral intake for the last few days  Patient had multiple recent hospitalization with hypoxic failure, sepsis and non-STEMI over the past couple of months.  Recently discharged from [facility] 07/15/2023.  Recent non-STEMI with recommendation for to manage medically.  Per family patient has been declining, over the last few days she has not been eating or drinking.  She has been having some abdominal discomfort and mild hematuria at home.  Patient admitted with hyponatremia, FTT. Develops acute hypoxic respiratory failure, acute diastolic HF exacerbation, aspiration PNA>       Assessment & Plan:   Principal Problem:   Hyponatremia Active Problems:   Myelodysplastic syndrome (HCC)   Failure to thrive in adult   AKI (acute kidney injury) (HCC)   Invasive ductal carcinoma of left breast (HCC)   Adenocarcinoma of left lung (HCC)   Chronic respiratory failure with hypoxia (HCC)   CKD stage 3a, GFR 45-59 ml/min (HCC)   Abdominal aortic aneurysm   Gout   Hypothyroidism   HTN (hypertension)   Generalized muscle weakness   Malnutrition of moderate degree   Paroxysmal atrial fibrillation (HCC)   Chronic diastolic CHF (congestive heart failure) (HCC) - LVEF 60-65% by echo 07-2023   Pancytopenia (HCC)   Hyperkalemia   Nonrheumatic aortic valve stenosis   CAD (coronary artery disease)   CMML (chronic myelomonocytic leukemia) (HCC)    Pulmonary emphysema (HCC)   Nausea and vomiting   Acute on chronic respiratory failure with hypoxia (HCC)   1-Hyponatremia: Multifactorial, prior history of SIADH, poor intake of solute concomitant high water intake.  -S/P IV fluids, didn't help.  -IV Tolvaptan without much improved.  -Nephrology assisting with management.  -Cortisol normal.  -Aldosterone renin. pending  -Received IV lasix 10/02. And 10/04 Sodium improving. Stable.   Acute Metabolic Encephalopathy; Patient was noticed to be more sleepy 10/02. ABG obtain showed PH 7,2, o2 31. PCO2 56 CCM consulted. Support care. Acidosis resolved.  Spike fever, antibiotics changed to Zosyn on 10/04. MS fluctuates, was more confuse overnight due to fever.    PNA; Concern for aspiration PNA 10/02: Chest x ray:  Mildly increased mild patchy atelectasis or pneumonia at both lung bases with interval small areas of mild probable pneumonia in the right mid and upper lung zones. Continue with Azithro. Day  MRSA PCR negative.  10/04: Notice to be more hypoxic, Chest x ray with progressive Pulmonary edema infiltrates. IV lasix BID ordered. Change antibiotics to Zosyn.  Evaluated by CCM again 10/04-- for AMS and worsening hypoxia. Patient had aspiration PNA, and component of HF/  Chest x ray 10/05: Mild improvement of moderate CHF.  Improved right and persistent left airspace diseases.    Acute on chronic Hypoxic  Respiratory failure, Acute Diastolic HF exacerbation.  ABG with acidosis hypercapnic, hypoxic  CCM think blood from ABG result likely venous.  Uses 3 L oxygen at home Received IV lasix 10/02. Improvement of hypoxia.  Develops worsening hypoxia 10/04. In setting of aspiration PNA and Pulmonary edema.  Received Lasix  40 mg IV times 2  Spike fever this am, Oxygen sat 95 now on 4 L oxygen down from 6. SBP soft 100. Will hold lasix for now. Could do lasix PRN if worsening hypoxia and if BP stable/   Acidosis, respiratory and  metabolic; Plan to monitor Bmet.  Received amp IV bicarb on 10/02.  Sodium bicarb tablet started, change to BID CCM consulted.  Resolved.   Hyperkalemia; Received IV lasix, and Veltassa.  Resolved  Failure to thrive;  In setting acute illness and malignancy, anemia.   Myelodysplastic syndrome -Received 2 unit PRBC 10/01 for hb at 6. Hb increased to 9 post transfusion.  Hb  Started Folic acid.   AKI on CKD stage 3a, GFR 45-59 ml/min (HCC) Appears to have CKD stage III A. -Monitor renal function -Avoid nephrotoxins Nephrology following.  Monitor on lasix.    Nausea vomiting.  Resolved.   Emphysema: Stable.   CMML, Pancytopenia, Anemia , pancytopenia SP 2 units PRBC 10/01 Follows with Dr Elbert Ewings.  -08-01-2023 pt given a dose of Aranesp as recommended by oncology   CAD: S/p recent NSTEMI(07-12-2023), which was managed medically. No chest pain and troponin currently negative -Continue home aspirin, statin, Hold metoprolol and Imdur today due to soft BP   Nonrheumatic aortic valve stenosis  Follow out patient cardiology  Malnutrition moderate degree On supplement.   Generalized  muscle weakness Multifactorial.   HTN: on metoprolol and imdur.   Hypothyroidism; on synthroid.   Gout: stable.   Abdominal aortic aneurysm CT abdomen with abdominal aneurysm of 5.1 cm -Need 33-month follow-up -Outpatient vascular surgery evaluation      Adenocarcinoma of left lung (HCC) Followed by oncology as an outpatient.   Invasive ductal carcinoma of left breast (HCC) History of lung cancer History of CMML Patient with multiple malignancies.  LDH elevated -Being managed by oncology as outpatient -Patient gets regular colony-stimulating and erythropoietin stimulating agents by oncology -Palliative care consulted    Nutrition Problem: Moderate Malnutrition Etiology: chronic illness    Signs/Symptoms: moderate fat depletion, moderate muscle depletion, energy  intake < or equal to 50% for > or equal to 1 month    Interventions: Ensure Enlive (each supplement provides 350kcal and 20 grams of protein), Magic cup, Liberalize Diet  Estimated body mass index is 26.13 kg/m as calculated from the following:   Height as of this encounter: 5\' 3"  (1.6 m).   Weight as of this encounter: 66.9 kg.   DVT prophylaxis: Lovenox Code Status: Full code Family Communication: grand son over phone  /10/05  Disposition Plan:  Status is: Inpatient Remains inpatient appropriate because: management, AMS, hyponatremia    Consultants:  CCM Nephrology Palliative care  Procedures:    Antimicrobials:    Subjective: I saw patient this am , she was sleepy. Per nurse patient didn't sleep last night./   In the afternoon, she was alert, answer questions. Mildly confuse. No significant cough.    Objective: Vitals:   08/07/23 0739 08/07/23 1000 08/07/23 1116 08/07/23 1200  BP: (!) 114/44 (!) 127/53 (!) 111/48 (!) 107/50  Pulse: 72 72 65 65  Resp: (!) 22 17 (!) 22 18  Temp: 98.8 F (37.1 C)  98.2 F (36.8 C)   TempSrc: Oral  Oral   SpO2: 94% 90% 100% 92%  Weight:      Height:        Intake/Output Summary (Last 24 hours) at 08/07/2023 1330 Last data filed at 08/07/2023 1200 Gross per 24 hour  Intake 1270.86  ml  Output 3800 ml  Net -2529.14 ml   Filed Weights   08/05/23 0610 08/06/23 0347 08/07/23 0500  Weight: 70.4 kg 68.8 kg 66.9 kg    Examination:  General exam: NAD Respiratory system: Normal resp effort, crackles bases.  Cardiovascular system: S 1, S 2, RRR Gastrointestinal system: BS present, soft, nt.  Central nervous system: alert, follows command Extremities: No edema  Data Reviewed: I have personally reviewed following labs and imaging studies  CBC: Recent Labs  Lab 07/31/23 2249 08/01/23 1005 08/02/23 0433 08/03/23 0443 08/04/23 0439 08/06/23 0409 08/07/23 0347  WBC 2.8*   < > 3.5* 3.7* 3.3* 3.8* 3.7*  NEUTROABS 0.2*   --  0.2* 0.2* 0.2*  --   --   HGB 7.8*   < > 7.0* 6.9* 9.9* 7.8* 8.9*  HCT 25.6*   < > 23.5* 22.9* 33.4* 27.0* 30.0*  MCV 100.8*   < > 101.7* 100.4* 97.9 101.5* 100.0  PLT 118*   < > 119* 116* 112* 108* 109*   < > = values in this interval not displayed.   Basic Metabolic Panel: Recent Labs  Lab 08/04/23 0439 08/04/23 1413 08/04/23 1810 08/04/23 2158 08/05/23 0343 08/05/23 1021 08/06/23 0409 08/07/23 0347  NA 121* 128*   < > 129* 131* 132* 131* 132*  K 5.2* 4.3  --   --  4.3  --  4.5 3.8  CL 92* 90*  --   --  94*  --  93* 89*  CO2 16* 28  --   --  28  --  29 32  GLUCOSE 89 112*  --   --  85  --  101* 107*  BUN 19 19  --   --  18  --  18 20  CREATININE 1.32* 1.23*  --   --  1.17*  --  1.04* 1.07*  CALCIUM 8.4* 8.8*  --   --  8.7*  --  8.5* 9.0   < > = values in this interval not displayed.   GFR: Estimated Creatinine Clearance: 34 mL/min (A) (by C-G formula based on SCr of 1.07 mg/dL (H)). Liver Function Tests: Recent Labs  Lab 07/31/23 2249  AST 32  ALT 12  ALKPHOS 45  BILITOT 0.5  PROT 8.7*  ALBUMIN 3.3*   Recent Labs  Lab 07/31/23 2249  LIPASE 49   No results for input(s): "AMMONIA" in the last 168 hours. Coagulation Profile: Recent Labs  Lab 07/31/23 2249 08/01/23 0729  INR 1.4* 1.3*   Cardiac Enzymes: No results for input(s): "CKTOTAL", "CKMB", "CKMBINDEX", "TROPONINI" in the last 168 hours. BNP (last 3 results) No results for input(s): "PROBNP" in the last 8760 hours. HbA1C: No results for input(s): "HGBA1C" in the last 72 hours. CBG: Recent Labs  Lab 08/04/23 0715 08/05/23 0557 08/06/23 0614 08/07/23 0556 08/07/23 0951  GLUCAP 86 91 89 91 91   Lipid Profile: No results for input(s): "CHOL", "HDL", "LDLCALC", "TRIG", "CHOLHDL", "LDLDIRECT" in the last 72 hours. Thyroid Function Tests: No results for input(s): "TSH", "T4TOTAL", "FREET4", "T3FREE", "THYROIDAB" in the last 72 hours.  Anemia Panel: No results for input(s): "VITAMINB12",  "FOLATE", "FERRITIN", "TIBC", "IRON", "RETICCTPCT" in the last 72 hours. Sepsis Labs: Recent Labs  Lab 08/07/23 0546  PROCALCITON <0.10  LATICACIDVEN 0.9    Recent Results (from the past 240 hour(s))  Resp panel by RT-PCR (RSV, Flu A&B, Covid) Anterior Nasal Swab     Status: None   Collection Time: 07/31/23 11:45 PM  Specimen: Anterior Nasal Swab  Result Value Ref Range Status   SARS Coronavirus 2 by RT PCR NEGATIVE NEGATIVE Final   Influenza A by PCR NEGATIVE NEGATIVE Final   Influenza B by PCR NEGATIVE NEGATIVE Final    Comment: (NOTE) The Xpert Xpress SARS-CoV-2/FLU/RSV plus assay is intended as an aid in the diagnosis of influenza from Nasopharyngeal swab specimens and should not be used as a sole basis for treatment. Nasal washings and aspirates are unacceptable for Xpert Xpress SARS-CoV-2/FLU/RSV testing.  Fact Sheet for Patients: BloggerCourse.com  Fact Sheet for Healthcare Providers: SeriousBroker.it  This test is not yet approved or cleared by the [place] FDA and has been authorized for detection and/or diagnosis of SARS-CoV-2 by FDA under an Emergency Use Authorization (EUA). This EUA will remain in effect (meaning this test can be used) for the duration of the COVID-19 declaration under Section 564(b)(1) of the Act, 21 U.S.C. section 360bbb-3(b)(1), unless the authorization is terminated or revoked.     Resp Syncytial Virus by PCR NEGATIVE NEGATIVE Final    Comment: (NOTE) Fact Sheet for Patients: BloggerCourse.com  Fact Sheet for Healthcare Providers: SeriousBroker.it  This test is not yet approved or cleared by the [place] FDA and has been authorized for detection and/or diagnosis of SARS-CoV-2 by FDA under an Emergency Use Authorization (EUA). This EUA will remain in effect (meaning this test can be used) for the duration of the COVID-19  declaration under Section 564(b)(1) of the Act, 21 U.S.C. section 360bbb-3(b)(1), unless the authorization is terminated or revoked.  Performed at [facility], [address], [place], [place] [address]   Blood culture (routine x 2)     Status: None   Collection Time: 07/31/23 11:52 PM   Specimen: BLOOD  Result Value Ref Range Status   Specimen Description BLOOD RIGHT ANTECUBITAL  Final   Special Requests   Final    BOTTLES DRAWN AEROBIC AND ANAEROBIC Blood Culture adequate volume   Culture   Final    NO GROWTH 5 DAYS Performed at [facility], [address], [place], [place] [address]    Report Status 08/06/2023 FINAL  Final  Blood culture (routine x 2)     Status: None   Collection Time: 08/01/23 12:49 AM   Specimen: BLOOD LEFT FOREARM  Result Value Ref Range Status   Specimen Description BLOOD LEFT FOREARM  Final   Special Requests   Final    BOTTLES DRAWN AEROBIC AND ANAEROBIC Blood Culture adequate volume   Culture   Final    NO GROWTH 5 DAYS Performed at [facility], [address], [place], [place] [address]    Report Status 08/06/2023 FINAL  Final  MRSA Next Gen by PCR, Nasal     Status: None   Collection Time: 08/05/23  3:56 PM   Specimen: Nasal Mucosa; Nasal Swab  Result Value Ref Range Status   MRSA by PCR Next Gen NOT DETECTED NOT DETECTED Final    Comment: (NOTE) The GeneXpert MRSA Assay (FDA approved for NASAL specimens only), is one component of a comprehensive MRSA colonization surveillance program. It is not intended to diagnose MRSA infection nor to guide or monitor treatment for MRSA infections. Test performance is not FDA approved in patients less than 78 years old. Performed at [facility], [address], [place], [place] [address]          Radiology Studies: DG Chest Port 1 View  Result Date: 08/07/2023  CLINICAL DATA:  Fever EXAM: PORTABLE CHEST 1 VIEW COMPARISON:  Yesterday FINDINGS: The Chin overlies the left  apex. Surgical clips in the left axilla. Midline trachea. Moderate cardiomegaly. Atherosclerosis in the transverse aorta. Small bilateral pleural effusions. No pneumothorax. Slightly improved right-sided aeration with moderate interstitial edema remaining. Persistent left and improved right basilar predominant airspace disease. IMPRESSION: Mild improvement in moderate congestive heart failure. Similar small bilateral pleural effusions with improved right and persistent left base airspace disease which could represent atelectasis or concurrent infection/aspiration. Aortic Atherosclerosis (ICD10-I70.0). Electronically Signed   By: Jeronimo Greaves M.D.   On: 08/07/2023 11:37   DG CHEST PORT 1 VIEW  Result Date: 08/06/2023 CLINICAL DATA:  Hypoxia. EXAM: PORTABLE CHEST 1 VIEW COMPARISON:  Radiograph 08/04/2023, CT 08/01/2023 FINDINGS: The heart is enlarged. Progressive interstitial thickening suspicious for pulmonary edema. Increase in bilateral pleural effusions and bibasilar airspace disease. Chain sutures in the left upper lobe. No pneumothorax. IMPRESSION: Cardiomegaly with progressive pulmonary edema. Increase in bibasilar airspace disease and pleural effusions. Electronically Signed   By: Narda Rutherford M.D.   On: 08/06/2023 16:01        Scheduled Meds:  aspirin EC  81 mg Oral Daily   azithromycin  500 mg Oral Daily   enoxaparin (LOVENOX) injection  30 mg Subcutaneous Q24H   feeding supplement (NEPRO CARB STEADY)  237 mL Oral BID BM   fluticasone furoate-vilanterol  1 puff Inhalation Daily   And   umeclidinium bromide  1 puff Inhalation Daily   folic acid  1 mg Oral Daily   guaiFENesin  600 mg Oral BID   levothyroxine  100 mcg Oral Q0600   magnesium oxide  200 mg Oral QHS   melatonin  3 mg Oral QHS   metoprolol succinate  25 mg Oral Daily   multivitamin with minerals  1 tablet Oral Daily   mouth rinse  15 mL Mouth Rinse 4 times per day   pravastatin  40 mg Oral QPM   psyllium  1 packet  Oral Daily   senna  1 tablet Oral QHS   Continuous Infusions:  piperacillin-tazobactam (ZOSYN)  IV 12.5 mL/hr at 08/07/23 0600     LOS: 6 days    Time spent: 35 minutes    Manaal Mandala A Tatjana Turcott, MD [facility]   If 7PM-7AM, please contact night-coverage [email or web address]  08/07/2023, 1:30 PM

## 2023-08-07 NOTE — Progress Notes (Signed)
Mobility Specialist Progress Note    08/07/23 1256  Mobility  Activity Stood at bedside;Transferred from bed to chair  Level of Assistance Moderate assist, patient does 50-74%  Assistive Device Stedy  Activity Response Tolerated well  Mobility Referral Yes  $Mobility charge 1 Mobility  Mobility Specialist Start Time (ACUTE ONLY) 1240  Mobility Specialist Stop Time (ACUTE ONLY) 1255  Mobility Specialist Time Calculation (min) (ACUTE ONLY) 15 min   Pre-Mobility: 65 HR, 94% SpO2 Post-Mobility: 62 HR, 96% SpO2  Pt received in bed and agreeable. Pt modA+1 for bed mobility. Pt completed x3 STS. Pt modA+1 to stand for ~10 seconds on first stand. Pt minA+1, stood ~20 seconds and marched for second stand. Pt took extended seated rest break then minA+1 to stand on 3rd attempt. Pt then transferred to chair. Left with call bell in reach and chair alarm on. Family present. RN notified.   Lisa [name] Mobility Specialist  Please Neurosurgeon or Rehab Office at [phone]

## 2023-08-07 NOTE — Progress Notes (Signed)
Occupational Therapy Treatment Patient Details Name: Lisa [name] MRN: [id number] DOB: 1934/10/18 Today's Date: 08/07/2023   History of present illness 89 y.o. female presents to [facility] hospital on 07/31/2023 with SOB, weakness and poor PO intake. Pt found to be hyponatremic. Pt received 2 units PRBC on 10/1 AM. PMH includes chronic HFpEF, CKD III, PAH, lung adenocarcinoma, CML, moderate AS, AAA, NSTEMI, TIA, PAF.   OT comments  Pt in bed upon arrival. Pt initially groggy and slow to respond; became more alert with verbal and tactile cues. Pt sat EOB mod A, unable to achieve standing for SPTs. Pt with flexed trunk posture throughout session. Pt sat EOB and participated in simple grooming and UB dressing tasks. Pt required max A to return to supine in bed. OT will continue to follow acutely to maximize level of function and safety      If plan is discharge home, recommend the following:  A little help with bathing/dressing/bathroom;A little help with walking and/or transfers;Assistance with cooking/housework;Assist for transportation   Equipment Recommendations  None recommended by OT    Recommendations for Other Services      Precautions / Restrictions Precautions Precautions: Fall;Other (comment) Precaution Comments: monitor SpO2 Restrictions Weight Bearing Restrictions: No       Mobility Bed Mobility Overal bed mobility: Needs Assistance Bed Mobility: Supine to Sit, Sit to Supine     Supine to sit: Mod assist Sit to supine: Max assist   General bed mobility comments: increased time, cues for sequencing    Transfers                   General transfer comment: Atempted x 2 from EOB, pt unable to power up; pt with flexed trunk posture     Balance Overall balance assessment: Needs assistance Sitting-balance support: No upper extremity supported, Feet supported Sitting balance-Leahy Scale: Fair Sitting balance - Comments: sitting EOB                                    ADL either performed or assessed with clinical judgement   ADL Overall ADL's : Needs assistance/impaired     Grooming: Wash/dry hands;Wash/dry face;Sitting;Contact guard assist           Upper Body Dressing : Contact guard assist;Sitting                     General ADL Comments: Pt sat EOB 7-8 minutes    Extremity/Trunk Assessment Upper Extremity Assessment Upper Extremity Assessment: Generalized weakness   Lower Extremity Assessment Lower Extremity Assessment: Defer to PT evaluation   Cervical / Trunk Assessment Cervical / Trunk Assessment: Kyphotic    Vision Baseline Vision/History: 1 Wears glasses Ability to See in Adequate Light: 0 Adequate Patient Visual Report: No change from baseline     Perception     Praxis      Cognition Arousal: Alert Behavior During Therapy: WFL for tasks assessed/performed Overall Cognitive Status: History of cognitive impairments - at baseline                                 General Comments: Groggy. Slow to respond.        Exercises      Shoulder Instructions       General Comments      Pertinent Vitals/ Pain  Pain Assessment Pain Assessment: No/denies pain Pain Score: 0-No pain Pain Intervention(s): Monitored during session  Home Living                                          Prior Functioning/Environment              Frequency  Min 1X/week        Progress Toward Goals  OT Goals(current goals can now be found in the care plan section)  Progress towards OT goals: OT to reassess next treatment     Plan      Co-evaluation                 AM-PAC OT "6 Clicks" Daily Activity     Outcome Measure   Help from another person eating meals?: None Help from another person taking care of personal grooming?: A Little Help from another person toileting, which includes using toliet, bedpan, or urinal?: A Little Help from another person  bathing (including washing, rinsing, drying)?: A Lot Help from another person to put on and taking off regular upper body clothing?: A Little Help from another person to put on and taking off regular lower body clothing?: A Lot 6 Click Score: 17    End of Session Equipment Utilized During Treatment: Gait belt  OT Visit Diagnosis: Unsteadiness on feet (R26.81);Other abnormalities of gait and mobility (R26.89);History of falling (Z91.81);Muscle weakness (generalized) (M62.81)   Activity Tolerance Patient limited by fatigue   Patient Left with call bell/phone within reach;in bed;with bed alarm set   Nurse Communication          Time: 8938-1017 OT Time Calculation (min): 16 min  Charges: OT General Charges $OT Visit: 1 Visit OT Treatments $Self Care/Home Management : 8-22 mins    Galen Manila 08/07/2023, 11:19 AM

## 2023-08-07 NOTE — Progress Notes (Signed)
TRH night cross cover note:   I was notified by RN that this patient whose hospital course has included acute metabolic encephalopathy in the setting of pneumonia as well as hyponatremia, was noted to be progressively confused earlier this evening's night shift and has spiked a temperature of 101.7, which appears to be at least a 24-hour Tmax and the first objective fever in at least the last 24 hours.  Most recent vital signs also notable for heart rates in the 90s, respiratory rate in the 20s to low 30s.  It is noted that the patient is already on Zosyn for pneumonia.  PR acetaminophen was given.  Given that the patient has spiked an objective fever in spite of broad-spectrum antibiotics, and that this is the first objective fever greater than 24 hours, will pursue updated blood cultures x 2, urinalysis, repeat chest x-ray, procalcitonin level, as well as stat lactic acid level.    Newton Pigg, DO Hospitalist

## 2023-08-07 NOTE — Telephone Encounter (Signed)
Spoke with pt grandson Dr. Georgiana Shore on the phone for a period of time on 10/3 and 10/4 concerning pt disposition. Dr. Richardine Service is concerned that pt needs more intensity than current rehab disposition recommendations stating that pt has been to the the hospital 3x in the past couple of months and went to an AIR previously after her previous hospital admission and made progress. He states that pt has a lot of family support at home from his mother and uncle. Previous notes from pt last hospitalization show pt was ambulating 60 ft at Min A on RW 07/14/23.   Harrel Carina, DPT, CLT  Acute Rehabilitation Services Office: [phone] (Secure chat preferred)

## 2023-08-08 DIAGNOSIS — Z7189 Other specified counseling: Secondary | ICD-10-CM | POA: Diagnosis not present

## 2023-08-08 DIAGNOSIS — Z515 Encounter for palliative care: Secondary | ICD-10-CM | POA: Diagnosis not present

## 2023-08-08 LAB — CBC WITH DIFFERENTIAL/PLATELET
Abs Immature Granulocytes: 0.04 10*3/uL (ref 0.00–0.07)
Basophils Absolute: 0 10*3/uL (ref 0.0–0.1)
Basophils Relative: 0 %
Eosinophils Absolute: 0 10*3/uL (ref 0.0–0.5)
Eosinophils Relative: 0 %
HCT: 26.5 % — ABNORMAL LOW (ref 36.0–46.0)
Hemoglobin: 7.6 g/dL — ABNORMAL LOW (ref 12.0–15.0)
Immature Granulocytes: 1 %
Lymphocytes Relative: 26 %
Lymphs Abs: 0.8 10*3/uL (ref 0.7–4.0)
MCH: 29.5 pg (ref 26.0–34.0)
MCHC: 28.7 g/dL — ABNORMAL LOW (ref 30.0–36.0)
MCV: 102.7 fL — ABNORMAL HIGH (ref 80.0–100.0)
Monocytes Absolute: 2.1 10*3/uL — ABNORMAL HIGH (ref 0.1–1.0)
Monocytes Relative: 66 %
Neutro Abs: 0.2 10*3/uL — CL (ref 1.7–7.7)
Neutrophils Relative %: 7 %
Platelets: 85 10*3/uL — ABNORMAL LOW (ref 150–400)
RBC: 2.58 MIL/uL — ABNORMAL LOW (ref 3.87–5.11)
RDW: 19.1 % — ABNORMAL HIGH (ref 11.5–15.5)
WBC: 3.2 10*3/uL — ABNORMAL LOW (ref 4.0–10.5)
nRBC: 5.7 % — ABNORMAL HIGH (ref 0.0–0.2)

## 2023-08-08 LAB — BASIC METABOLIC PANEL
Anion gap: 8 (ref 5–15)
BUN: 27 mg/dL — ABNORMAL HIGH (ref 8–23)
CO2: 34 mmol/L — ABNORMAL HIGH (ref 22–32)
Calcium: 8.6 mg/dL — ABNORMAL LOW (ref 8.9–10.3)
Chloride: 91 mmol/L — ABNORMAL LOW (ref 98–111)
Creatinine, Ser: 1.31 mg/dL — ABNORMAL HIGH (ref 0.44–1.00)
GFR, Estimated: 39 mL/min — ABNORMAL LOW (ref >60)
Glucose, Bld: 77 mg/dL (ref 70–99)
Potassium: 3.5 mmol/L (ref 3.5–5.1)
Sodium: 133 mmol/L — ABNORMAL LOW (ref 135–145)

## 2023-08-08 LAB — ALDOSTERONE + RENIN ACTIVITY W/ RATIO
ALDO / PRA Ratio: 2.3 (ref 0.0–30.0)
Aldosterone: 5.1 ng/dL (ref 0.0–30.0)
PRA LC/MS/MS: 2.195 ng/mL/h (ref 0.167–5.380)

## 2023-08-08 LAB — GLUCOSE, CAPILLARY: Glucose-Capillary: 79 mg/dL (ref 70–99)

## 2023-08-08 MED ORDER — FILGRASTIM 300 MCG/ML IJ SOLN
300.0000 ug | Freq: Once | INTRAMUSCULAR | Status: AC
  Administered 2023-08-08: 300 ug via SUBCUTANEOUS
  Filled 2023-08-08: qty 1

## 2023-08-08 NOTE — Progress Notes (Signed)
Mobility Specialist Progress Note    08/08/23 1301  Mobility  Activity Dangled on edge of bed;Stood at bedside  Level of Assistance Minimal assist, patient does 75% or more  Assistive Device Front wheel walker  Activity Response Tolerated well  Mobility Referral Yes  $Mobility charge 1 Mobility  Mobility Specialist Start Time (ACUTE ONLY) 1247  Mobility Specialist Stop Time (ACUTE ONLY) 1259  Mobility Specialist Time Calculation (min) (ACUTE ONLY) 12 min   Pre-Mobility: 78 HR, 89% SpO2 Post-Mobility: 76 HR, 90% SpO2  Pt received in bed and stated she didn't feel like doing anything. Pt agreeable to sit EOB. Pt completed x1 set of 10 single leg marches with each leg. Pt stood x1 for her bed sheets to be adjusted. Pt returned to supine and left with call bell in reach and bed alarm on.   [place] Mobility Specialist  Please Contact via SecureChat or Rehab Office at [phone]

## 2023-08-08 NOTE — Progress Notes (Signed)
PT Cancellation Note  Patient Details Name: Torian Thoennes MRN: [id number] DOB: Jan 10, 1934   Cancelled Treatment:    Reason Eval/Treat Not Completed: Other (comment) (Pt was able to perform minimal activity with mobility specialist today. Deferred physical therapy until tomorrow to see if pt is able to increase participation due to family desire for pt to attend AIR. Pt PMH was updated to reflect family desire to have more information in the chart)  PMH: 89 y.o. female presents to [facility] hospital on 07/31/2023 with SOB, weakness and poor PO intake. Pt found to be hyponatremic. Pt received 2 units PRBC on 10/1 AM. Pt has recently been hospitalized on November 05, 2022 and then April 07, 2023 at which time pt attended AIR improving from Mod A to Min A for functional mobility and returning home. Pt was then re-hospitalized on 07/14/23 and discharged home at [facility] A ambulating 60 ft with RW. Pt was then re-hospitalized for this admission on 08/02/23. PMH includes chronic HFpEF, CKD III, PAH, lung adenocarcinoma, CML, moderate AS, AAA, NSTEMI, TIA, PAF.   Harrel Carina, DPT, CLT  Acute Rehabilitation Services Office: [phone] (Secure chat preferred)  Claudia Desanctis 08/08/2023, 4:56 PM

## 2023-08-08 NOTE — Progress Notes (Signed)
PROGRESS NOTE    Lisa [name]  YQI:[id number] DOB: 10-01-1934 DOA: 07/31/2023 PCP: [name], MD   Brief Narrative: 89 year old with past medical history significant for chronic heart failure preserved ejection fraction, CKD stage IIIb, severe pulmonary hypertension, history of lung adenocarcinoma, MDS, CML, chronic neutropenia, moderate AAS mild MR, AAA, non-STEMI, TIA, paroxysmal A-fib not on anticoagulation, hypothyroidism, COPD with chronic hypoxia to 2-3 L of oxygen at baseline presents with worsening shortness of breath, worsening weakness and poor oral intake for the last few days  Patient had multiple recent hospitalization with hypoxic failure, sepsis and non-STEMI over the past couple of months.  Recently discharged from [facility] 07/15/2023.  Recent non-STEMI with recommendation for to manage medically.  Per family patient has been declining, over the last few days she has not been eating or drinking.  She has been having some abdominal discomfort and mild hematuria at home.  Patient admitted with hyponatremia, FTT. Develops acute hypoxic respiratory failure, acute diastolic HF exacerbation, aspiration PNA>     Assessment & Plan:   Principal Problem:   Hyponatremia Active Problems:   Myelodysplastic syndrome (HCC)   Failure to thrive in adult   AKI (acute kidney injury) (HCC)   Invasive ductal carcinoma of left breast (HCC)   Adenocarcinoma of left lung (HCC)   Chronic respiratory failure with hypoxia (HCC)   CKD stage 3a, GFR 45-59 ml/min (HCC)   Abdominal aortic aneurysm   Gout   Hypothyroidism   HTN (hypertension)   Generalized muscle weakness   Malnutrition of moderate degree   Paroxysmal atrial fibrillation (HCC)   Chronic diastolic CHF (congestive heart failure) (HCC) - LVEF 60-65% by echo 07-2023   Pancytopenia (HCC)   Hyperkalemia   Nonrheumatic aortic valve stenosis   CAD (coronary artery disease)   CMML (chronic myelomonocytic leukemia) (HCC)    Pulmonary emphysema (HCC)   Nausea and vomiting   Acute on chronic respiratory failure with hypoxia (HCC)   1-Hyponatremia: Multifactorial, prior history of SIADH, poor intake of solute concomitant high water intake, component HF.  -S/P IV fluids, didn't help.  -IV Tolvaptan without much improved.  -Nephrology assisting with management.  -Cortisol normal.  -Aldosterone renin. pending  -Received IV lasix 10/02. And 10/04 Sodium improving. Stable.   Acute Metabolic Encephalopathy; Patient was noticed to be more sleepy 10/02. ABG obtain showed PH 7,2, o2 31. PCO2 56 CCM consulted. Support care. Acidosis resolved.  Spike fever, antibiotics changed to Zosyn on 10/04. 10/05: MS fluctuates, was more confuse overnight due to fever.  She is alert today, less confuse.   PNA; Concern for aspiration PNA -10/02: Chest x ray:  Mildly increased mild patchy atelectasis or pneumonia at both lung bases with interval small areas of mild probable pneumonia in the right mid and upper lung zones. -Continue with Azithro. Day 4.  -MRSA PCR negative.  -10/04: Notice to be more hypoxic, Chest x ray with progressive Pulmonary edema infiltrates. IV lasix BID ordered. Change antibiotics to Zosyn.  -Evaluated by CCM again 10/04-- for AMS and worsening hypoxia. Patient had aspiration PNA, and component of HF/  -Chest x ray 10/05: Mild improvement of moderate CHF.  Improved right and persistent left airspace diseases.  Improving fever curve decreased, oxygen requirement back to chronic 3 L oxygen.   Acute on chronic Hypoxic  Respiratory failure, Acute Diastolic HF exacerbation.  -ABG with acidosis hypercapnic, hypoxic  -CCM think blood from ABG result likely venous.  -Uses 3 L oxygen at home -Received IV lasix  10/02. Improvement of hypoxia.  -Develops worsening hypoxia 10/04. In setting of aspiration PNA and Pulmonary edema.  -10/05: Spike fever - -Started on IV lasix by nephrology, BP stable. Oxygen sat  stable on 3 L.   Acidosis, respiratory and metabolic; Plan to monitor Bmet.  Received amp IV bicarb on 10/02.  Sodium bicarb tablet started, change to BID CCM consulted.  Resolved.   Hyperkalemia; Received IV lasix, and Veltassa.  Resolved  Failure to thrive;  In setting acute illness and malignancy, anemia.   Myelodysplastic syndrome/pancytopenia.  -Received 2 unit PRBC 10/01 for hb at 6. Hb increased to 9 post transfusion.  Started Folic acid.  Monitor WBC and platelet . Suspect platelet count down due to infection.  Neutropenic precaution.  She is on broad spectrum antibiotics.  Discussed with Dr Elbert Ewings, he recommend Neupogen until ANC more than 1000  AKI on CKD stage 3a, GFR 45-59 ml/min (HCC) Appears to have CKD stage III A. -Monitor renal function -Avoid nephrotoxins Nephrology following.  Monitor on lasix.    Nausea vomiting.  Resolved.   Emphysema: Stable.   CMML: SP 2 units PRBC 10/01 Follows with Dr Elbert Ewings.  -08-01-2023 pt given a dose of Aranesp as recommended by oncology   CAD: S/p recent NSTEMI(07-12-2023), which was managed medically. No chest pain and troponin currently negative -Continue home aspirin, statin, Hold metoprolol and Imdur today due to soft BP   Nonrheumatic aortic valve stenosis  Follow out patient cardiology  Malnutrition moderate degree On supplement.   Generalized  muscle weakness Multifactorial.   HTN: on metoprolol and imdur.   Hypothyroidism; on synthroid.   Gout: stable.   Abdominal aortic aneurysm CT abdomen with abdominal aneurysm of 5.1 cm -Need 58-month follow-up -Outpatient vascular surgery evaluation      Adenocarcinoma of left lung (HCC) Followed by oncology as an outpatient.   Invasive ductal carcinoma of left breast (HCC) History of lung cancer History of CMML Patient with multiple malignancies.  LDH elevated -Being managed by oncology as outpatient -Patient gets regular colony-stimulating  and erythropoietin stimulating agents by oncology -Palliative care consulted    Nutrition Problem: Moderate Malnutrition Etiology: chronic illness    Signs/Symptoms: moderate fat depletion, moderate muscle depletion, energy intake < or equal to 50% for > or equal to 1 month    Interventions: Ensure Enlive (each supplement provides 350kcal and 20 grams of protein), Magic cup, Liberalize Diet  Estimated body mass index is 26.28 kg/m as calculated from the following:   Height as of this encounter: 5\' 3"  (1.6 m).   Weight as of this encounter: 67.3 kg.   DVT prophylaxis: Lovenox Code Status: Full code Family Communication: grand son over phone  /10/05  Disposition Plan:  Status is: Inpatient Remains inpatient appropriate because: management, AMS, hyponatremia    Consultants:  CCM Nephrology Palliative care  Procedures:    Antimicrobials:    Subjective: She is more alert today. She is feeling better.   Objective: Vitals:   08/08/23 0854 08/08/23 0900 08/08/23 1000 08/08/23 1100  BP:  (!) 119/50 (!) 123/48 (!) 127/47  Pulse:  71 73 76  Resp: (!) 29 20 (!) 24 (!) 24  Temp:    98.8 F (37.1 C)  TempSrc:    Oral  SpO2:  98% 91% 93%  Weight:      Height:        Intake/Output Summary (Last 24 hours) at 08/08/2023 1311 Last data filed at 08/08/2023 0945 Gross per 24 hour  Intake 1199.26  ml  Output 950 ml  Net 249.26 ml   Filed Weights   08/06/23 0347 08/07/23 0500 08/08/23 0500  Weight: 68.8 kg 66.9 kg 67.3 kg    Examination:  General exam: NAD Respiratory system: No significant crackles Cardiovascular system: S,1 S 2 RRR Gastrointestinal system: BS present, soft, nt Central nervous system: Alert, follows command Extremities: No edema  Data Reviewed: I have personally reviewed following labs and imaging studies  CBC: Recent Labs  Lab 08/02/23 0433 08/03/23 0443 08/04/23 0439 08/06/23 0409 08/07/23 0347 08/07/23 1447 08/08/23 0611  WBC 3.5*  3.7* 3.3* 3.8* 3.7* 4.7 3.2*  NEUTROABS 0.2* 0.2* 0.2*  --   --  0.6* 0.2*  HGB 7.0* 6.9* 9.9* 7.8* 8.9* 8.3* 7.6*  HCT 23.5* 22.9* 33.4* 27.0* 30.0* 28.8* 26.5*  MCV 101.7* 100.4* 97.9 101.5* 100.0 101.1* 102.7*  PLT 119* 116* 112* 108* 109* 100* 85*   Basic Metabolic Panel: Recent Labs  Lab 08/04/23 1413 08/04/23 1810 08/05/23 0343 08/05/23 1021 08/06/23 0409 08/07/23 0347 08/08/23 0611  NA 128*   < > 131* 132* 131* 132* 133*  K 4.3  --  4.3  --  4.5 3.8 3.5  CL 90*  --  94*  --  93* 89* 91*  CO2 28  --  28  --  29 32 34*  GLUCOSE 112*  --  85  --  101* 107* 77  BUN 19  --  18  --  18 20 27*  CREATININE 1.23*  --  1.17*  --  1.04* 1.07* 1.31*  CALCIUM 8.8*  --  8.7*  --  8.5* 9.0 8.6*   < > = values in this interval not displayed.   GFR: Estimated Creatinine Clearance: 27.9 mL/min (A) (by C-G formula based on SCr of 1.31 mg/dL (H)). Liver Function Tests: No results for input(s): "AST", "ALT", "ALKPHOS", "BILITOT", "PROT", "ALBUMIN" in the last 168 hours.  No results for input(s): "LIPASE", "AMYLASE" in the last 168 hours.  No results for input(s): "AMMONIA" in the last 168 hours. Coagulation Profile: No results for input(s): "INR", "PROTIME" in the last 168 hours.  Cardiac Enzymes: No results for input(s): "CKTOTAL", "CKMB", "CKMBINDEX", "TROPONINI" in the last 168 hours. BNP (last 3 results) No results for input(s): "PROBNP" in the last 8760 hours. HbA1C: No results for input(s): "HGBA1C" in the last 72 hours. CBG: Recent Labs  Lab 08/05/23 0557 08/06/23 0614 08/07/23 0556 08/07/23 0951 08/08/23 0547  GLUCAP 91 89 91 91 79   Lipid Profile: No results for input(s): "CHOL", "HDL", "LDLCALC", "TRIG", "CHOLHDL", "LDLDIRECT" in the last 72 hours. Thyroid Function Tests: No results for input(s): "TSH", "T4TOTAL", "FREET4", "T3FREE", "THYROIDAB" in the last 72 hours.  Anemia Panel: No results for input(s): "VITAMINB12", "FOLATE", "FERRITIN", "TIBC", "IRON",  "RETICCTPCT" in the last 72 hours. Sepsis Labs: Recent Labs  Lab 08/07/23 0546  PROCALCITON <0.10  LATICACIDVEN 0.9    Recent Results (from the past 240 hour(s))  Resp panel by RT-PCR (RSV, Flu A&B, Covid) Anterior Nasal Swab     Status: None   Collection Time: 07/31/23 11:45 PM   Specimen: Anterior Nasal Swab  Result Value Ref Range Status   SARS Coronavirus 2 by RT PCR NEGATIVE NEGATIVE Final   Influenza A by PCR NEGATIVE NEGATIVE Final   Influenza B by PCR NEGATIVE NEGATIVE Final    Comment: (NOTE) The Xpert Xpress SARS-CoV-2/FLU/RSV plus assay is intended as an aid in the diagnosis of influenza from Nasopharyngeal swab specimens and should not  be used as a sole basis for treatment. Nasal washings and aspirates are unacceptable for Xpert Xpress SARS-CoV-2/FLU/RSV testing.  Fact Sheet for Patients: BloggerCourse.com  Fact Sheet for Healthcare Providers: SeriousBroker.it  This test is not yet approved or cleared by the [place] FDA and has been authorized for detection and/or diagnosis of SARS-CoV-2 by FDA under an Emergency Use Authorization (EUA). This EUA will remain in effect (meaning this test can be used) for the duration of the COVID-19 declaration under Section 564(b)(1) of the Act, 21 U.S.C. section 360bbb-3(b)(1), unless the authorization is terminated or revoked.     Resp Syncytial Virus by PCR NEGATIVE NEGATIVE Final    Comment: (NOTE) Fact Sheet for Patients: BloggerCourse.com  Fact Sheet for Healthcare Providers: SeriousBroker.it  This test is not yet approved or cleared by the [place] FDA and has been authorized for detection and/or diagnosis of SARS-CoV-2 by FDA under an Emergency Use Authorization (EUA). This EUA will remain in effect (meaning this test can be used) for the duration of the COVID-19 declaration under Section 564(b)(1) of the  Act, 21 U.S.C. section 360bbb-3(b)(1), unless the authorization is terminated or revoked.  Performed at [facility], [address] [place], [place] [address]   Blood culture (routine x 2)     Status: None   Collection Time: 07/31/23 11:52 PM   Specimen: BLOOD  Result Value Ref Range Status   Specimen Description BLOOD RIGHT ANTECUBITAL  Final   Special Requests   Final    BOTTLES DRAWN AEROBIC AND ANAEROBIC Blood Culture adequate volume   Culture   Final    NO GROWTH 5 DAYS Performed at [facility], [address], [place], [place] [address]    Report Status 08/06/2023 FINAL  Final  Blood culture (routine x 2)     Status: None   Collection Time: 08/01/23 12:49 AM   Specimen: BLOOD LEFT FOREARM  Result Value Ref Range Status   Specimen Description BLOOD LEFT FOREARM  Final   Special Requests   Final    BOTTLES DRAWN AEROBIC AND ANAEROBIC Blood Culture adequate volume   Culture   Final    NO GROWTH 5 DAYS Performed at [facility], [address] [place], [place] [address]    Report Status 08/06/2023 FINAL  Final  MRSA Next Gen by PCR, Nasal     Status: None   Collection Time: 08/05/23  3:56 PM   Specimen: Nasal Mucosa; Nasal Swab  Result Value Ref Range Status   MRSA by PCR Next Gen NOT DETECTED NOT DETECTED Final    Comment: (NOTE) The GeneXpert MRSA Assay (FDA approved for NASAL specimens only), is one component of a comprehensive MRSA colonization surveillance program. It is not intended to diagnose MRSA infection nor to guide or monitor treatment for MRSA infections. Test performance is not FDA approved in patients less than 76 years old. Performed at [facility], [address], [place], [place] [address]   Culture, blood (Routine X 2) w Reflex to ID Panel     Status: None (Preliminary result)   Collection Time: 08/07/23  6:41 AM   Specimen: BLOOD RIGHT HAND  Result Value Ref Range Status   Specimen Description BLOOD RIGHT HAND  Final    Special Requests   Final    BOTTLES DRAWN AEROBIC AND ANAEROBIC Blood Culture results may not be optimal due to an excessive volume of blood received in culture bottles   Culture  Final    NO GROWTH < 24 HOURS Performed at [facility], [address] [place], [place] [address]    Report Status PENDING  Incomplete  Culture, blood (Routine X 2) w Reflex to ID Panel     Status: None (Preliminary result)   Collection Time: 08/07/23  6:42 AM   Specimen: BLOOD RIGHT HAND  Result Value Ref Range Status   Specimen Description BLOOD RIGHT HAND  Final   Special Requests   Final    BOTTLES DRAWN AEROBIC AND ANAEROBIC Blood Culture adequate volume   Culture   Final    NO GROWTH < 24 HOURS Performed at [facility], [address] [place], [place] [address]    Report Status PENDING  Incomplete         Radiology Studies: DG Chest Port 1 View  Result Date: 08/07/2023 CLINICAL DATA:  Fever EXAM: PORTABLE CHEST 1 VIEW COMPARISON:  Yesterday FINDINGS: The Chin overlies the left apex. Surgical clips in the left axilla. Midline trachea. Moderate cardiomegaly. Atherosclerosis in the transverse aorta. Small bilateral pleural effusions. No pneumothorax. Slightly improved right-sided aeration with moderate interstitial edema remaining. Persistent left and improved right basilar predominant airspace disease. IMPRESSION: Mild improvement in moderate congestive heart failure. Similar small bilateral pleural effusions with improved right and persistent left base airspace disease which could represent atelectasis or concurrent infection/aspiration. Aortic Atherosclerosis (ICD10-I70.0). Electronically Signed   By: Jeronimo Greaves M.D.   On: 08/07/2023 11:37   DG CHEST PORT 1 VIEW  Result Date: 08/06/2023 CLINICAL DATA:  Hypoxia. EXAM: PORTABLE CHEST 1 VIEW COMPARISON:  Radiograph 08/04/2023, CT 08/01/2023 FINDINGS: The heart is enlarged. Progressive interstitial thickening suspicious for pulmonary  edema. Increase in bilateral pleural effusions and bibasilar airspace disease. Chain sutures in the left upper lobe. No pneumothorax. IMPRESSION: Cardiomegaly with progressive pulmonary edema. Increase in bibasilar airspace disease and pleural effusions. Electronically Signed   By: Narda Rutherford M.D.   On: 08/06/2023 16:01        Scheduled Meds:  aspirin EC  81 mg Oral Daily   azithromycin  500 mg Oral Daily   Chlorhexidine Gluconate Cloth  6 each Topical Daily   enoxaparin (LOVENOX) injection  30 mg Subcutaneous Q24H   feeding supplement (NEPRO CARB STEADY)  237 mL Oral BID BM   fluticasone furoate-vilanterol  1 puff Inhalation Daily   And   umeclidinium bromide  1 puff Inhalation Daily   folic acid  1 mg Oral Daily   furosemide  40 mg Intravenous Daily   guaiFENesin  600 mg Oral BID   levothyroxine  100 mcg Oral Q0600   magnesium oxide  200 mg Oral QHS   melatonin  3 mg Oral QHS   metoprolol succinate  25 mg Oral Daily   multivitamin with minerals  1 tablet Oral Daily   mouth rinse  15 mL Mouth Rinse 4 times per day   pravastatin  40 mg Oral QPM   psyllium  1 packet Oral Daily   senna  1 tablet Oral QHS   Continuous Infusions:  piperacillin-tazobactam (ZOSYN)  IV 3.375 g (08/08/23 0637)     LOS: 7 days    Time spent: 35 minutes    Dhrithi Riche A Taryll Reichenberger, MD [facility]   If 7PM-7AM, please contact night-coverage [email or web address]  08/08/2023, 1:11 PM

## 2023-08-08 NOTE — Progress Notes (Signed)
[facility] Progress Note  Name: Lisa [name] MRN: [id number] DOB: 11/25/1933  Chief Complaint:  Altered mental status/confusion   Subjective:  She had 950 mL UOP over 10/5 charted.  All output may not be charted.  She had a foley catheter placed in the interim.  Her oxygen requirement is improving.  Spoke with her son at bedside.  BP is 126/56  Review of systems:    She feels like her breathing is "alright"  No n/v  Denies chest pain   ------------ Background on referral:  Lisa [name] is a/an 89 y.o. female with a past medical history of HFpEF, CKD, pulmonary hypertension, aortic stenosis, CAD, atrial fibrillation, CML, COPD who presents with confusion.  Because of the patient's confusion history was largely obtained from her daughter at the bedside.  As well as chart review.   Patient was discharged from the hospital on 9/12 after being hospitalized with shortness of breath and NSTEMI that was medically managed.  The daughter states that the patient has become more confused over the past 2 to 3 days.  She is predominantly agitated at night wandering around and hard to redirect.  She frequently takes off her oxygen.  Possibly some shortness of breath particularly when her oxygen is removed.  She also has had very poor p.o. intake for about a week.  Last night had some nausea and vomiting as well as this morning.  Denies fevers and chills.  Possibly having some abdominal pain.  Frequently saying she has to urinate.  Also may have had some blood in her urine yesterday.  Patient's daughter says she tends to drink a lot of water throughout the day.  She was brought in the emergency department.  Urinalysis was normal.  She had a sodium of 118 and potassium 5.5.  She was given [facility] and started on IV fluids.  Urine lytes demonstrated osmolality of 170 and urine sodium of 29.   She had a similar hospitalization in July that mostly improved with IV fluids and was thought to be  related to poor solute intake.   Intake/Output Summary (Last 24 hours) at 08/08/2023 1403 Last data filed at 08/08/2023 1215 Gross per 24 hour  Intake 1199.26 ml  Output 950 ml  Net 249.26 ml     Vitals:  Vitals:   08/08/23 0900 08/08/23 1000 08/08/23 1100 08/08/23 1321  BP: (!) 119/50 (!) 123/48 (!) 127/47 97/61  Pulse: 71 73 76 76  Resp: 20 (!) 24 (!) 24 20  Temp:   98.8 F (37.1 C)   TempSrc:   Oral   SpO2: 98% 91% 93% 91%  Weight:      Height:         Physical Exam:       General elderly female in bed on 3 liters oxygen HEENT normocephalic atraumatic extraocular movements intact sclera anicteric Neck supple trachea midline Lungs clearer on auscultation; normal work of breathing at rest Heart S1S2 no rub Abdomen soft nontender nondistended Extremities no edema  Psych anxious on arrival but then improves with exam  Neuro - awake on arrival; alert and oriented x 3 provides hx and follows commands; conversant; hard of hearing  GU foley catheter in place with 750 mL uop  Medications reviewed   Labs:     Latest Ref Rng & Units 08/08/2023    6:11 AM 08/07/2023    3:47 AM 08/06/2023    4:09 AM  BMP  Glucose 70 - 99 mg/dL 77  782  101   BUN 8 - 23 mg/dL 27  20  18    Creatinine 0.44 - 1.00 mg/dL 1.61  0.96  0.45   Sodium 135 - 145 mmol/L 133  132  131   Potassium 3.5 - 5.1 mmol/L 3.5  3.8  4.5   Chloride 98 - 111 mmol/L 91  89  93   CO2 22 - 32 mmol/L 34  32  29   Calcium 8.9 - 10.3 mg/dL 8.6  9.0  8.5      Assessment/Plan:   Lisa [name] is a/an 89 y.o. female with a past medical history HFpEF, CKD, pulmonary hypertension, aortic stenosis, CAD, atrial fibrillation, CML, COPD who present w/ symptomatic hyponatremia    # Acute hypoxic resp failure  - concern for aspiration, overload, infection or a combination of the three.  Improving with diuretics but also note she was febrile overnight  - improved with lasix  - abx per primary team  - note she is now on  dysphagia diet  # Symptomatic hyponatremia:  Mixed picture but ultimately with fluid overload.  Could have some underlying SIADH as well, note remeron use.  S/p tolvaptan on 10/1 without much impact.  Fluids are currently off - sodium declined with hydration.  Labs were unchanged with tolvaptan but much improved with lasix.  - improved with diuresis  - lasix is ordered 40 mg IV daily for now and would titrate to PO regimen as respiratory status continues to improve - She would benefit from scheduled or PRN lasix on discharge - not any lasix on her home med list.   - Set fluid restriction of 1.5 liters  - BMP daily - I have stopped her remeron which may also have been contributing.  Would not restart   Hyperkalemia:  - improved with lasix    Pulmonary hypertension/recent NSTEMI/CHF:   - lasix as above   Chronic hypoxic respiratory failure/COPD: Management per primary team.  Optimize volume status as above    Aortic stenosis - noted as moderate to severe per 07/2023 Echo  Failure to thrive: underlying issue.  Appreciate help from palliative care   Anemia: Multifactorial related to malignancy.  On home ESA; PRBC's per primary team (got PRBC's on 10/1).  Note she got neupogen on 10/6   CML - per primary team   Disposition - per primary team.  Nephrology will sign off.  I have reached out to her primary team.  Please do not hesitate to call us with any questions     [name], MD 2:21 PM 08/08/2023

## 2023-08-08 NOTE — Progress Notes (Addendum)
   Palliative Medicine Inpatient Follow Up Note HPI: Lisa [name] is a 89 y.o. 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 intake for the past few days.    Palliative care has been asked to get involved to assist with additional goals of care conversations.   Today's Discussion 08/08/2023  *Please note that this is a verbal dictation therefore any spelling or grammatical errors are due to the "Dragon Medical One" system interpretation.  Chart reviewed inclusive of vital signs, progress notes, laboratory results, and diagnostic images. Sodium level at  133. PNA management per primary. PO's fairly improved.  I met with Lisa [name] at bedside this morning. She is resting comfortably. She shares that she continues to have some shortness of breath, she denies pain and nausea.   HCPOA documents have been obtained and will be scanned into Vynca.  Plan for ongoing peripheral Palliative support.   Objective Assessment: Vital Signs Vitals:   08/08/23 0854 08/08/23 0900  BP:  (!) 119/50  Pulse:  71  Resp: (!) 29 20  Temp:    SpO2:  98%    Intake/Output Summary (Last 24 hours) at 08/08/2023 1003 Last data filed at 08/08/2023 1610 Gross per 24 hour  Intake 1199.26 ml  Output 950 ml  Net 249.26 ml   Last Weight  Most recent update: 08/08/2023  5:48 AM    Weight  67.3 kg (148 lb 5.9 oz)            Gen: Elderly AA F in NAD HEENT: moist mucous membranes CV: Regular rate and rhythm  PULM: On 4LPM Lisa [name], breathing is nonlabored  ABD: soft/nontender  EXT: No edema  Neuro: Alert and oriented x2-3  SUMMARY  OF RECOMMENDATIONS   Full code at this time --> Patient and her family will review MOST form   Advanced Directives have been obtained and will be scanned into Vynca  Continue to treat the treatable  Patient would benefit from additional conversations with Oncologist - Dr. [name] in the setting of underlying malignancies to better address expectations moving forward   Plan for medical optimization and patient to transition home  Incremental PMT support  Time: 25  ______________________________________________________________________________________ Lamarr Lulas [place] Palliative Medicine Team Team Cell Phone: [phone] Please utilize secure chat with additional questions, if there is no response within 30 minutes please call the above phone number  Palliative Medicine Team providers are available by phone from 7am to 7pm daily and can be reached through the team cell phone.  Should this patient require assistance outside of these hours, please call the patient's attending physician.

## 2023-08-09 DIAGNOSIS — Z7189 Other specified counseling: Secondary | ICD-10-CM | POA: Diagnosis not present

## 2023-08-09 DIAGNOSIS — Z515 Encounter for palliative care: Secondary | ICD-10-CM | POA: Diagnosis not present

## 2023-08-09 LAB — CBC WITH DIFFERENTIAL/PLATELET
Abs Immature Granulocytes: 0.46 10*3/uL — ABNORMAL HIGH (ref 0.00–0.07)
Basophils Absolute: 0 10*3/uL (ref 0.0–0.1)
Basophils Relative: 0 %
Eosinophils Absolute: 0 10*3/uL (ref 0.0–0.5)
Eosinophils Relative: 0 %
HCT: 29.9 % — ABNORMAL LOW (ref 36.0–46.0)
Hemoglobin: 8.5 g/dL — ABNORMAL LOW (ref 12.0–15.0)
Immature Granulocytes: 6 %
Lymphocytes Relative: 15 %
Lymphs Abs: 1.2 10*3/uL (ref 0.7–4.0)
MCH: 29.2 pg (ref 26.0–34.0)
MCHC: 28.4 g/dL — ABNORMAL LOW (ref 30.0–36.0)
MCV: 102.7 fL — ABNORMAL HIGH (ref 80.0–100.0)
Monocytes Absolute: 3.7 10*3/uL — ABNORMAL HIGH (ref 0.1–1.0)
Monocytes Relative: 44 %
Neutro Abs: 3 10*3/uL (ref 1.7–7.7)
Neutrophils Relative %: 35 %
Platelets: 79 10*3/uL — ABNORMAL LOW (ref 150–400)
RBC: 2.91 MIL/uL — ABNORMAL LOW (ref 3.87–5.11)
RDW: 19 % — ABNORMAL HIGH (ref 11.5–15.5)
WBC: 8.3 10*3/uL (ref 4.0–10.5)
nRBC: 3.4 % — ABNORMAL HIGH (ref 0.0–0.2)

## 2023-08-09 LAB — GLUCOSE, CAPILLARY: Glucose-Capillary: 82 mg/dL (ref 70–99)

## 2023-08-09 LAB — BASIC METABOLIC PANEL
Anion gap: 13 (ref 5–15)
BUN: 29 mg/dL — ABNORMAL HIGH (ref 8–23)
CO2: 35 mmol/L — ABNORMAL HIGH (ref 22–32)
Calcium: 9 mg/dL (ref 8.9–10.3)
Chloride: 89 mmol/L — ABNORMAL LOW (ref 98–111)
Creatinine, Ser: 1.12 mg/dL — ABNORMAL HIGH (ref 0.44–1.00)
GFR, Estimated: 48 mL/min — ABNORMAL LOW (ref >60)
Glucose, Bld: 82 mg/dL (ref 70–99)
Potassium: 3.8 mmol/L (ref 3.5–5.1)
Sodium: 137 mmol/L (ref 135–145)

## 2023-08-09 LAB — PATHOLOGIST SMEAR REVIEW

## 2023-08-09 MED ORDER — FUROSEMIDE 10 MG/ML IJ SOLN
40.0000 mg | Freq: Every day | INTRAMUSCULAR | Status: DC
  Administered 2023-08-09: 40 mg via INTRAVENOUS
  Filled 2023-08-09: qty 4

## 2023-08-09 MED ORDER — ISOSORBIDE MONONITRATE ER 30 MG PO TB24
30.0000 mg | ORAL_TABLET | Freq: Every day | ORAL | Status: DC
  Administered 2023-08-09 – 2023-08-11 (×3): 30 mg via ORAL
  Filled 2023-08-09 (×3): qty 1

## 2023-08-09 NOTE — Progress Notes (Addendum)
This chaplain scanned the Pt. HCPOA (prepared outside the hospital) into the Pt. EMR. This chaplain updated the Pt. daughter-[name] and Pt. son-Eddie at the bedside.  Chaplain Stephanie Acre [phone]

## 2023-08-09 NOTE — Progress Notes (Signed)
PROGRESS NOTE    Lisa [name]  ION:[id number] DOB: 12/11/1933 DOA: 07/31/2023 PCP: [name], MD   Brief Narrative: 89 year old with past medical history significant for chronic heart failure preserved ejection fraction, CKD stage IIIb, severe pulmonary hypertension, history of lung adenocarcinoma, MDS, CML, chronic neutropenia, moderate AAS mild MR, AAA, non-STEMI, TIA, paroxysmal A-fib not on anticoagulation, hypothyroidism, COPD with chronic hypoxia to 2-3 L of oxygen at baseline presents with worsening shortness of breath, worsening weakness and poor oral intake for the last few days  Patient had multiple recent hospitalization with hypoxic failure, sepsis and non-STEMI over the past couple of months.  Recently discharged from [facility]/15/2023.  Recent non-STEMI with recommendation for to manage medically.  Per family patient has been declining, over the last few days she has not been eating or drinking.  She has been having some abdominal discomfort and mild hematuria at home.  Patient admitted with hyponatremia, FTT. Develops acute hypoxic respiratory failure, acute diastolic HF exacerbation, aspiration PNA>     Assessment & Plan:   Principal Problem:   Hyponatremia Active Problems:   Myelodysplastic syndrome (HCC)   Failure to thrive in adult   AKI (acute kidney injury) (HCC)   Invasive ductal carcinoma of left breast (HCC)   Adenocarcinoma of left lung (HCC)   Chronic respiratory failure with hypoxia (HCC)   CKD stage 3a, GFR 45-59 ml/min (HCC)   Abdominal aortic aneurysm   Gout   Hypothyroidism   HTN (hypertension)   Generalized muscle weakness   Malnutrition of moderate degree   Paroxysmal atrial fibrillation (HCC)   Chronic diastolic CHF (congestive heart failure) (HCC) - LVEF 60-65% by echo 07-2023   Pancytopenia (HCC)   Hyperkalemia   Nonrheumatic aortic valve stenosis   CAD (coronary artery disease)   CMML (chronic myelomonocytic leukemia) (HCC)    Pulmonary emphysema (HCC)   Nausea and vomiting   Acute on chronic respiratory failure with hypoxia (HCC)   1-Hyponatremia: Multifactorial, prior history of SIADH, poor intake of solute concomitant high water intake, component HF.  -S/P IV fluids, didn't help.  -IV Tolvaptan without much improved.  -Nephrology assisting with management.  -Cortisol normal.  -Aldosterone renin. pending  -Received IV lasix 10/02. And 10/04 Sodium improving. Stable.   Acute Metabolic Encephalopathy; Patient was noticed to be more sleepy 10/02. ABG obtain showed PH 7,2, o2 31. PCO2 56 CCM consulted. Support care. Acidosis resolved.  Spike fever, antibiotics changed to Zosyn on 10/04. 10/05: MS fluctuates, was more confuse overnight due to fever.  She is alert, less confuse. Improved.   PNA; Concern for aspiration PNA: -10/02: Chest x ray:  Mildly increased mild patchy atelectasis or pneumonia at both lung bases with interval small areas of mild probable pneumonia in the right mid and upper lung zones. -She completed 5 days Azithromycin 10/07 -MRSA PCR negative.  -10/04: Notice to be more hypoxic, Chest x ray with progressive Pulmonary edema infiltrates. IV lasix BID ordered. Change antibiotics to Zosyn.  -Evaluated by CCM again 10/04-- for AMS and worsening hypoxia. Patient had aspiration PNA, and component of HF/  -Chest x ray 10/05: Mild improvement of moderate CHF.  Improved right and persistent left airspace diseases.  -Improving fever curve decreased, oxygen requirement back to chronic 3 L oxygen.  -plan to treat for total 7 days antibiotics, continue with Zosyn. Day 4  Acute on chronic Hypoxic  Respiratory failure, Acute Diastolic HF exacerbation.  -ABG with acidosis hypercapnic, hypoxic  -CCM think blood from ABG  result likely venous.  -Uses 3 L oxygen at home -Received IV lasix 10/02. Improvement of hypoxia.  -Develops worsening hypoxia 10/04. In setting of aspiration PNA and Pulmonary edema.   -10/05: Spike fever - -Started on IV lasix by nephrology, BP stable. Oxygen sat stable on 3 L.  Will continue with IV lasix today, could transition to oral tomorrow. Per nephrology could do lasix three times per week.   Acidosis, respiratory and metabolic; Plan to monitor Bmet.  Received amp IV bicarb on 10/02.  Sodium bicarb tablet started, change to BID CCM consulted.  Resolved.   Hyperkalemia; Received IV lasix, and Veltassa.  Resolved  Failure to thrive;  In setting acute illness and malignancy, anemia.   Myelodysplastic syndrome/pancytopenia.  -Received 2 unit PRBC 10/01 for hb at 6. -Hb increased to 9 post transfusion.  -Started Folic acid.  -Monitor WBC and platelet . Suspect platelet count down due to infection.  -She is on broad spectrum antibiotics.  -Discussed with Dr Elbert Ewings, he recommend Neupogen until ANC more than 1000 -Received a dose of Neupogen 10/06. ANC: increased to 3.0. no further doses needed,  AKI on CKD stage 3a, GFR 45-59 ml/min (HCC) Appears to have CKD stage III A. -Monitor renal function -Avoid nephrotoxins Nephrology following.  Monitor on lasix.    Nausea vomiting.  Resolved.   Emphysema: Stable.   CMML: SP 2 units PRBC 10/01 Follows with Dr Elbert Ewings.  -08-01-2023 pt given a dose of Aranesp as recommended by oncology   CAD: S/p recent NSTEMI(07-12-2023), which was managed medically. No chest pain and troponin currently negative -Continue home aspirin, statin, metoprolol.  BP increasing , resume lower dose imdur.    Nonrheumatic aortic valve stenosis  Follow out patient cardiology  Malnutrition moderate degree On supplement.   Generalized  muscle weakness Multifactorial.   HTN: on metoprolol and imdur.   Hypothyroidism; on synthroid.   Gout: stable.   Abdominal aortic aneurysm CT abdomen with abdominal aneurysm of 5.1 cm -Need 4-month follow-up -Outpatient vascular surgery evaluation      Adenocarcinoma of left  lung (HCC) Followed by oncology as an outpatient.   Invasive ductal carcinoma of left breast (HCC) History of lung cancer History of CMML Patient with multiple malignancies.  LDH elevated -Being managed by oncology as outpatient -Patient gets regular colony-stimulating and erythropoietin stimulating agents by oncology -Palliative care consulted    Nutrition Problem: Moderate Malnutrition Etiology: chronic illness    Signs/Symptoms: moderate fat depletion, moderate muscle depletion, energy intake < or equal to 50% for > or equal to 1 month    Interventions: Ensure Enlive (each supplement provides 350kcal and 20 grams of protein), Magic cup, Liberalize Diet  Estimated body mass index is 26.17 kg/m as calculated from the following:   Height as of this encounter: 5\' 3"  (1.6 m).   Weight as of this encounter: 67 kg.   DVT prophylaxis: Lovenox Code Status: Full code Family Communication: grand son over phone  /10/06. Daughter and son at bedside 10/07 Disposition Plan:  Status is: Inpatient Remains inpatient appropriate because: Plan for PT evaluation today     Consultants:  CCM Nephrology Palliative care  Procedures:    Antimicrobials:    Subjective: She is more alert, appears less weak. She is able to moves all 4 extremities. Denies worsening dyspnea.  Objective: Vitals:   08/09/23 0800 08/09/23 0900 08/09/23 1200 08/09/23 1500  BP: (!) 126/50 (!) 137/51 (!) 109/46 (!) 153/68  Pulse: 71 77 67 89  Resp: 20 (!)  22 (!) 22 (!) 24  Temp:   98.6 F (37 C)   TempSrc:   Oral   SpO2: 95% 95% 96% 94%  Weight:      Height:        Intake/Output Summary (Last 24 hours) at 08/09/2023 1619 Last data filed at 08/09/2023 1323 Gross per 24 hour  Intake 990 ml  Output 1700 ml  Net -710 ml   Filed Weights   08/07/23 0500 08/08/23 0500 08/09/23 0500  Weight: 66.9 kg 67.3 kg 67 kg    Examination:  General exam: NAD Respiratory system BL crackles.  Cardiovascular  system: S 1, S 2 RRR Gastrointestinal system: BS present, soft nt Extremities: No edema  Data Reviewed: I have personally reviewed following labs and imaging studies  CBC: Recent Labs  Lab 08/03/23 0443 08/04/23 0439 08/06/23 0409 08/07/23 0347 08/07/23 1447 08/08/23 0611 08/09/23 0645  WBC 3.7* 3.3* 3.8* 3.7* 4.7 3.2* 8.3  NEUTROABS 0.2* 0.2*  --   --  0.6* 0.2* 3.0  HGB 6.9* 9.9* 7.8* 8.9* 8.3* 7.6* 8.5*  HCT 22.9* 33.4* 27.0* 30.0* 28.8* 26.5* 29.9*  MCV 100.4* 97.9 101.5* 100.0 101.1* 102.7* 102.7*  PLT 116* 112* 108* 109* 100* 85* 79*   Basic Metabolic Panel: Recent Labs  Lab 08/05/23 0343 08/05/23 1021 08/06/23 0409 08/07/23 0347 08/08/23 0611 08/09/23 0645  NA 131* 132* 131* 132* 133* 137  K 4.3  --  4.5 3.8 3.5 3.8  CL 94*  --  93* 89* 91* 89*  CO2 28  --  29 32 34* 35*  GLUCOSE 85  --  101* 107* 77 82  BUN 18  --  18 20 27* 29*  CREATININE 1.17*  --  1.04* 1.07* 1.31* 1.12*  CALCIUM 8.7*  --  8.5* 9.0 8.6* 9.0   GFR: Estimated Creatinine Clearance: 32.5 mL/min (A) (by C-G formula based on SCr of 1.12 mg/dL (H)). Liver Function Tests: No results for input(s): "AST", "ALT", "ALKPHOS", "BILITOT", "PROT", "ALBUMIN" in the last 168 hours.  No results for input(s): "LIPASE", "AMYLASE" in the last 168 hours.  No results for input(s): "AMMONIA" in the last 168 hours. Coagulation Profile: No results for input(s): "INR", "PROTIME" in the last 168 hours.  Cardiac Enzymes: No results for input(s): "CKTOTAL", "CKMB", "CKMBINDEX", "TROPONINI" in the last 168 hours. BNP (last 3 results) No results for input(s): "PROBNP" in the last 8760 hours. HbA1C: No results for input(s): "HGBA1C" in the last 72 hours. CBG: Recent Labs  Lab 08/06/23 0614 08/07/23 0556 08/07/23 0951 08/08/23 0547 08/09/23 0558  GLUCAP 89 91 91 79 82   Lipid Profile: No results for input(s): "CHOL", "HDL", "LDLCALC", "TRIG", "CHOLHDL", "LDLDIRECT" in the last 72 hours. Thyroid Function  Tests: No results for input(s): "TSH", "T4TOTAL", "FREET4", "T3FREE", "THYROIDAB" in the last 72 hours.  Anemia Panel: No results for input(s): "VITAMINB12", "FOLATE", "FERRITIN", "TIBC", "IRON", "RETICCTPCT" in the last 72 hours. Sepsis Labs: Recent Labs  Lab 08/07/23 0546  PROCALCITON <0.10  LATICACIDVEN 0.9    Recent Results (from the past 240 hour(s))  Resp panel by RT-PCR (RSV, Flu A&B, Covid) Anterior Nasal Swab     Status: None   Collection Time: 07/31/23 11:45 PM   Specimen: Anterior Nasal Swab  Result Value Ref Range Status   SARS Coronavirus 2 by RT PCR NEGATIVE NEGATIVE Final   Influenza A by PCR NEGATIVE NEGATIVE Final   Influenza B by PCR NEGATIVE NEGATIVE Final    Comment: (NOTE) The Xpert Xpress SARS-CoV-2/FLU/RSV  plus assay is intended as an aid in the diagnosis of influenza from Nasopharyngeal swab specimens and should not be used as a sole basis for treatment. Nasal washings and aspirates are unacceptable for Xpert Xpress SARS-CoV-2/FLU/RSV testing.  Fact Sheet for Patients: BloggerCourse.com  Fact Sheet for Healthcare Providers: SeriousBroker.it  This test is not yet approved or cleared by the [place] FDA and has been authorized for detection and/or diagnosis of SARS-CoV-2 by FDA under an Emergency Use Authorization (EUA). This EUA will remain in effect (meaning this test can be used) for the duration of the COVID-19 declaration under Section 564(b)(1) of the Act, 21 U.S.C. section 360bbb-3(b)(1), unless the authorization is terminated or revoked.     Resp Syncytial Virus by PCR NEGATIVE NEGATIVE Final    Comment: (NOTE) Fact Sheet for Patients: BloggerCourse.com  Fact Sheet for Healthcare Providers: SeriousBroker.it  This test is not yet approved or cleared by the [place] FDA and has been authorized for detection and/or diagnosis of  SARS-CoV-2 by FDA under an Emergency Use Authorization (EUA). This EUA will remain in effect (meaning this test can be used) for the duration of the COVID-19 declaration under Section 564(b)(1) of the Act, 21 U.S.C. section 360bbb-3(b)(1), unless the authorization is terminated or revoked.  Performed at [facility], [address] [place], [place] [address]   Blood culture (routine x 2)     Status: None   Collection Time: 07/31/23 11:52 PM   Specimen: BLOOD  Result Value Ref Range Status   Specimen Description BLOOD RIGHT ANTECUBITAL  Final   Special Requests   Final    BOTTLES DRAWN AEROBIC AND ANAEROBIC Blood Culture adequate volume   Culture   Final    NO GROWTH 5 DAYS Performed at [facility], [address], [place], [place] [address]    Report Status 08/06/2023 FINAL  Final  Blood culture (routine x 2)     Status: None   Collection Time: 08/01/23 12:49 AM   Specimen: BLOOD LEFT FOREARM  Result Value Ref Range Status   Specimen Description BLOOD LEFT FOREARM  Final   Special Requests   Final    BOTTLES DRAWN AEROBIC AND ANAEROBIC Blood Culture adequate volume   Culture   Final    NO GROWTH 5 DAYS Performed at [facility], [address], [place], [place] [address]    Report Status 08/06/2023 FINAL  Final  MRSA Next Gen by PCR, Nasal     Status: None   Collection Time: 08/05/23  3:56 PM   Specimen: Nasal Mucosa; Nasal Swab  Result Value Ref Range Status   MRSA by PCR Next Gen NOT DETECTED NOT DETECTED Final    Comment: (NOTE) The GeneXpert MRSA Assay (FDA approved for NASAL specimens only), is one component of a comprehensive MRSA colonization surveillance program. It is not intended to diagnose MRSA infection nor to guide or monitor treatment for MRSA infections. Test performance is not FDA approved in patients less than 47 years old. Performed at [facility], [address] [place], [place] [address]   Culture, blood (Routine X 2) w  Reflex to ID Panel     Status: None (Preliminary result)   Collection Time: 08/07/23  6:41 AM   Specimen: BLOOD RIGHT HAND  Result Value Ref Range Status   Specimen Description BLOOD RIGHT HAND  Final   Special Requests   Final    BOTTLES DRAWN AEROBIC AND ANAEROBIC Blood Culture results  may not be optimal due to an excessive volume of blood received in culture bottles   Culture   Final    NO GROWTH 2 DAYS Performed at [facility], [address], [place], [place] [address]    Report Status PENDING  Incomplete  Culture, blood (Routine X 2) w Reflex to ID Panel     Status: None (Preliminary result)   Collection Time: 08/07/23  6:42 AM   Specimen: BLOOD RIGHT HAND  Result Value Ref Range Status   Specimen Description BLOOD RIGHT HAND  Final   Special Requests   Final    BOTTLES DRAWN AEROBIC AND ANAEROBIC Blood Culture adequate volume   Culture   Final    NO GROWTH 2 DAYS Performed at [facility], [address] [place], [place] [address]    Report Status PENDING  Incomplete         Radiology Studies: No results found.      Scheduled Meds:  aspirin EC  81 mg Oral Daily   Chlorhexidine Gluconate Cloth  6 each Topical Daily   enoxaparin (LOVENOX) injection  30 mg Subcutaneous Q24H   feeding supplement (NEPRO CARB STEADY)  237 mL Oral BID BM   fluticasone furoate-vilanterol  1 puff Inhalation Daily   And   umeclidinium bromide  1 puff Inhalation Daily   folic acid  1 mg Oral Daily   furosemide  40 mg Intravenous Daily   guaiFENesin  600 mg Oral BID   isosorbide mononitrate  30 mg Oral Daily   levothyroxine  100 mcg Oral Q0600   magnesium oxide  200 mg Oral QHS   melatonin  3 mg Oral QHS   metoprolol succinate  25 mg Oral Daily   multivitamin with minerals  1 tablet Oral Daily   mouth rinse  15 mL Mouth Rinse 4 times per day   pravastatin  40 mg Oral QPM   psyllium  1 packet Oral Daily   senna  1 tablet Oral QHS   Continuous Infusions:   piperacillin-tazobactam (ZOSYN)  IV 3.375 g (08/09/23 1325)     LOS: 8 days    Time spent: 35 minutes    Keone Kamer A Abena Erdman, MD [facility]   If 7PM-7AM, please contact night-coverage [email or web address]  08/09/2023, 4:19 PM

## 2023-08-09 NOTE — Consult Note (Signed)
[facility] Customer service manager [facility]) Accountable Care Organization (ACO) [facility] Liaison Note  08/09/2023  Lisa [name] Oct 01, 1934 [id number]  Covering Lisa Shanks, RN [facility] hospital liaison)  Location: [facility] Liaison screened the patient remotely at [facility].  Insurance:  Medicare   Lisa [name] is a 89 y.o. female who is a Optician, dispensing Care Patient of Margo Aye, Kathleene Hazel, MD. The patient was screened for 30 day readmission hospitalization with noted extreme risk score for unplanned readmission risk with 5 IP/1 ED in 6 months.  The patient was assessed for potential [facility]) Care Management service needs for post hospital transition for care coordination. Review of patient's electronic medical record reveals patient was admitted with Hyponatremia. Currently pending placement for SNF or CIR for ongoing rehabilitation.  Plan: [facility] Liaison will continue to follow progress and disposition to asess for post hospital community care coordination/management needs.  Referral request for community care coordination: pending disposition.   [facility] Care Management/Population Health does not replace or interfere with any arrangements made by the Inpatient Transition of Care team.   For questions contact:   Lisa Cousin, RN, [facility] Liaison [place]   Population Health Office Hours MTWF  8:00 am-6:00 pm [phone] mobile [phone] [Office toll free line] Office Hours are M-F 8:30 - 5 pm [email or web address]

## 2023-08-09 NOTE — Progress Notes (Signed)
Physical Therapy Treatment Patient Details Name: Lisa [name] MRN: [id number] DOB: October 08, 1934 Today's Date: 08/09/2023   History of Present Illness 89 y.o. female presents to [facility] hospital on 07/31/2023 with SOB, weakness and poor PO intake. Pt found to be hyponatremic. Pt received 2 units PRBC on 10/1 AM. Pt has recently been hospitalized on November 05, 2022 and then April 07, 2023 at which time pt attended AIR improving from Mod A to Min A for functional mobility and returning home. Pt was then re-hospitalized on 07/14/23 and discharged home at [facility] A ambulating 60 ft with RW. Pt was then re-hospitalized for this admission on 08/02/23. PMH includes chronic HFpEF, CKD III, PAH, lung adenocarcinoma, CML, moderate AS, AAA, NSTEMI, TIA, PAF.    PT Comments  Pt received in supine and agreeable to session. Pt demonstrates improved mobility and activity tolerance this session. Pt able to tolerate increased gait distance with one seated rest break. Pt able to perform x5 serial STS with min A . Pt requires increase to 5L O2 to maintain SpO2 >90% during mobility tasks. Pt requires cues for sequencing throughout session. Pt continues to benefit from PT services to progress toward functional mobility goals.     If plan is discharge home, recommend the following: A little help with walking and/or transfers;A little help with bathing/dressing/bathroom;Assistance with cooking/housework;Direct supervision/assist for medications management;Direct supervision/assist for financial management;Help with stairs or ramp for entrance;Assist for transportation   Can travel by private vehicle     Yes  Equipment Recommendations  None recommended by PT    Recommendations for Other Services       Precautions / Restrictions Precautions Precautions: Fall;Other (comment) Precaution Comments: monitor SpO2 Restrictions Weight Bearing Restrictions: No     Mobility  Bed Mobility Overal bed mobility: Needs Assistance Bed  Mobility: Supine to Sit, Sit to Supine     Supine to sit: Contact guard, HOB elevated Sit to supine: Contact guard assist, HOB elevated   General bed mobility comments: increased time, cues for sequencing. min A for supine scoot towards HOB    Transfers Overall transfer level: Needs assistance Equipment used: Rolling walker (2 wheels) Transfers: Sit to/from Stand Sit to Stand: Min assist           General transfer comment: STS from EOB x6 and chair x1 with min A for power up. Cues for hand placement each trial    Ambulation/Gait Ambulation/Gait assistance: Contact guard assist Gait Distance (Feet): 15 Feet (+10) Assistive device: Rolling walker (2 wheels) Gait Pattern/deviations: Step-through pattern, Wide base of support, Trunk flexed, Decreased stride length Gait velocity: reduced     General Gait Details: Slow gait with cues for upright posture, increased step length, and RW proximity. Assist with line management. One seated rest break due to BLE weakness and fatigue      Balance Overall balance assessment: Needs assistance Sitting-balance support: No upper extremity supported, Feet supported Sitting balance-Leahy Scale: Fair Sitting balance - Comments: sitting EOB   Standing balance support: Reliant on assistive device for balance, Bilateral upper extremity supported, During functional activity Standing balance-Leahy Scale: Poor Standing balance comment: with RW support                            Cognition Arousal: Alert Behavior During Therapy: WFL for tasks assessed/performed Overall Cognitive Status: History of cognitive impairments - at baseline  Exercises Other Exercises Other Exercises: x5 serial STS from EOB    General Comments General comments (skin integrity, edema, etc.): SpO2 86% sitting EOB on 4L requiring increase to 5L to maintain >90% during mobility      Pertinent  Vitals/Pain Pain Assessment Pain Assessment: Faces Pain Score: 0-No pain     PT Goals (current goals can now be found in the care plan section) Acute Rehab PT Goals Patient Stated Goal: not stated PT Goal Formulation: With patient Time For Goal Achievement: 08/16/23 Progress towards PT goals: Progressing toward goals    Frequency    Min 1X/week       AM-PAC PT "6 Clicks" Mobility   Outcome Measure  Help needed turning from your back to your side while in a flat bed without using bedrails?: A Little Help needed moving from lying on your back to sitting on the side of a flat bed without using bedrails?: A Little Help needed moving to and from a bed to a chair (including a wheelchair)?: A Little Help needed standing up from a chair using your arms (e.g., wheelchair or bedside chair)?: A Little Help needed to walk in hospital room?: A Little Help needed climbing 3-5 steps with a railing? : Total 6 Click Score: 16    End of Session Equipment Utilized During Treatment: Gait belt;Oxygen Activity Tolerance: Patient limited by fatigue;Patient tolerated treatment well Patient left: in bed;with call bell/phone within reach;with family/visitor present Nurse Communication: Mobility status PT Visit Diagnosis: Other abnormalities of gait and mobility (R26.89);Muscle weakness (generalized) (M62.81)     Time: 1660-6301 PT Time Calculation (min) (ACUTE ONLY): 31 min  Charges:    $Gait Training: 8-22 mins $Therapeutic Activity: 8-22 mins PT General Charges $$ ACUTE PT VISIT: 1 Visit                     Johny Shock, PTA Acute Rehabilitation Services Secure Chat Preferred  [phone]    Johny Shock 08/09/2023, 4:29 PM

## 2023-08-09 NOTE — Progress Notes (Signed)
Speech Language Pathology Treatment: Dysphagia  Patient Details Name: Lisa [name] MRN: [id number] DOB: Sep 15, 1934 Today's Date: 08/09/2023 Time: 4098-1191 SLP Time Calculation (min) (ACUTE ONLY): 14 min  Assessment / Plan / Recommendation Clinical Impression  Pt with much improved level of alertness this date. Her son present in room reports pt has returned to baseline mentation. Observed pt with challenging trials of thin liquids, purees, and solids with no overt s/s of aspiration. She did demonstrate prolonged mastication, although suspect this is likely due to to edentulism. Recommend upgrading diet to Dys 3 texture solids and continuing thin liquids. No further ST f/u is indicated at this time. Will s/o.    HPI HPI: Patient is an 89 y.o. female with PMH: COPD, chronic HFpEF, CKD III, PAH, lung adenocarcinoma, CML, moderate AS, AAA, NSTEMI, TIA, PAF. She presented to the hosptial on 08/01/23 with worsening SOB, worsening weakness and poor PO intake for a few days prior. She has had multiple recent hospitalizations for hypoxic failure, sepsis, NSTEMI over past couple of months. In ED, CTA chest was negative for PE, some mucous plugging in right lower lobe and a 16mm nodular density in the left upper lobe. She  was admittted with hyponatremia. SLP swallow evaluation ordered on 08/06/23 following incident of respiratory distress which family reported started shortly after patient had eaten something.      SLP Plan  All goals met      Recommendations for follow up therapy are one component of a multi-disciplinary discharge planning process, led by the attending physician.  Recommendations may be updated based on patient status, additional functional criteria and insurance authorization.    Recommendations  Diet recommendations: Dysphagia 3 (mechanical soft);Thin liquid Liquids provided via: Cup;Straw Medication Administration: Whole meds with puree Supervision: Staff to assist with self  feeding Compensations: Slow rate;Small sips/bites;Minimize environmental distractions Postural Changes and/or Swallow Maneuvers: Seated upright 90 degrees                  Oral care BID   Frequent or constant Supervision/Assistance Dysphagia, unspecified (R13.10)     All goals met     Gwynneth Aliment, M.A., CF-SLP Speech Language Pathology, Acute Rehabilitation Services  Secure Chat preferred [phone]   08/09/2023, 4:36 PM

## 2023-08-09 NOTE — Progress Notes (Signed)
   Palliative Medicine Inpatient Follow Up Note HPI: Lisa [name] is a 89 y.o. 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 intake for the past few days.    Palliative care has been asked to get involved to assist with additional goals of care conversations.   Today's Discussion 08/09/2023  *Please note that this is a verbal dictation therefore any spelling or grammatical errors are due to the "[employer]agon [employer] One" system interpretation.  Chart reviewed inclusive of vital signs, progress notes, laboratory results, and diagnostic images. Sodium level at  137. PNA management per primary. PO's remain improved.   I met with Lisa [name] at bedside this morning in the company of her son, Lisa [name] and daughter, Lisa [name].   Lisa [name] feels that her shortness of breath is better today. She denies pain or nausea. She is hopeful to mobilize more.  Provided education to patients family related specifically to delirium. We discussed how it causes alteration in cognition and is acute. We reviewed the reasons why Lisa [name] is experiencing this. Discussed the importance of family presence and reorientation, in additional to maintaining routine. Reviewed how some patients reach a new baseline level of function.   Questions and concerns answered.   Plan for ongoing peripheral Palliative support.  Objective Assessment: Vital Signs Vitals:   08/09/23 0900 08/09/23 1200  BP: (!) 137/51 (!) 109/46  Pulse: 77 67  Resp: (!) 22 (!) 22  Temp:  98.6 F (37 C)  SpO2: 95% 96%    Intake/Output Summary (Last 24 hours) at 08/09/2023 1220 Last  data filed at 08/09/2023 1610 Gross per 24 hour  Intake 390 ml  Output 1700 ml  Net -1310 ml   Last Weight  Most recent update: 08/09/2023  6:22 AM    Weight  67 kg (147 lb 11.3 oz)            Gen: Elderly AA F in NAD HEENT: moist mucous membranes CV: Regular rate and rhythm  PULM: On 4LPM [place], breathing is nonlabored  ABD: soft/nontender  EXT: No edema  Neuro: Alert and oriented x2-3  SUMMARY OF RECOMMENDATIONS   Full code at this time --> Patient and her family will review MOST form   Advanced Directives have been obtained and will be scanned into [employer]  Continue to treat the treatable  Patient would benefit from additional conversations with Oncologist - Dr. [name] in the setting of underlying malignancies to better address expectations moving forward   Plan for medical optimization and patient to transition home  Incremental PMT support  Time: 15  ______________________________________________________________________________________ Lamarr Lulas  Palliative Medicine Team Team Cell Phone: [phone] Please utilize secure chat with additional questions, if there is no response within 30 minutes please call the above phone number  Palliative Medicine Team providers are available by phone from 7am to 7pm daily and can be reached through the team cell phone.  Should this patient require assistance outside of these hours, please call the patient's attending physician.

## 2023-08-09 NOTE — Progress Notes (Signed)
Occupational Therapy Treatment Patient Details Name: Lisa [name] MRN: [id number] DOB: 1934-04-04 Today's Date: 08/09/2023   History of present illness 89 y.o. female presents to [facility] hospital on 07/31/2023 with SOB, weakness and poor PO intake. Pt found to be hyponatremic. Pt received 2 units PRBC on 10/1 AM. Pt has recently been hospitalized on November 05, 2022 and then April 07, 2023 at which time pt attended AIR improving from Mod A to Min A for functional mobility and returning home. Pt was then re-hospitalized on 07/14/23 and discharged home at [facility] A ambulating 60 ft with RW. Pt was then re-hospitalized for this admission on 08/02/23. PMH includes chronic HFpEF, CKD III, PAH, lung adenocarcinoma, CML, moderate AS, AAA, NSTEMI, TIA, PAF.   OT comments  Pt progressing well toward established OT goals this session. Pt discharge disposition updated to inpatient rehab <3 hours/day due to assist levels and pt desire to gain independence prior to transition back home. Family interested in more intensive therapy and educated regarding safety being top priority and will continue to update if pt able to demo appropriate tolerance within a safe range. Focus session on optimizing strength and activity tolerance. Performing 3 STS transfers and two very short distance gait trails (~21ft each). Pt doffing socks and engaged in BUE HEP at EOB. Pt needing up to 5L O2 to maintain SpO2. Patient will benefit from continued inpatient follow up therapy, <3 hours/day       If plan is discharge home, recommend the following:  A little help with bathing/dressing/bathroom;A little help with walking and/or transfers;Assistance with cooking/housework;Assist for transportation   Equipment Recommendations  None recommended by OT    Recommendations for Other Services      Precautions / Restrictions Precautions Precautions: Fall;Other (comment) Precaution Comments: monitor SpO2 Restrictions Weight Bearing Restrictions: No        Mobility Bed Mobility Overal bed mobility: Needs Assistance Bed Mobility: Supine to Sit, Sit to Supine     Supine to sit: Contact guard, HOB elevated Sit to supine: Contact guard assist, HOB elevated   General bed mobility comments: increaseed time and cues for sequencing    Transfers Overall transfer level: Needs assistance Equipment used: Rolling walker (2 wheels) Transfers: Sit to/from Stand Sit to Stand: Min assist           General transfer comment: Light assist to rise     Balance Overall balance assessment: Needs assistance Sitting-balance support: No upper extremity supported, Feet supported Sitting balance-Leahy Scale: Fair Sitting balance - Comments: sitting EOB   Standing balance support: Reliant on assistive device for balance, Bilateral upper extremity supported, During functional activity Standing balance-Leahy Scale: Poor Standing balance comment: with RW support                           ADL either performed or assessed with clinical judgement   ADL Overall ADL's : Needs assistance/impaired                     Lower Body Dressing: Minimal assistance;Sitting/lateral leans Lower Body Dressing Details (indicate cue type and reason): to doff socks at end of session prior to return to supine Toilet Transfer: Cueing for safety;Cueing for sequencing;Stand-pivot;Rolling walker (2 wheels);Minimal assistance Toilet Transfer Details (indicate cue type and reason): cues for hand placement; light A to rise         Functional mobility during ADLs: Contact guard assist;Cueing for safety;Cueing for sequencing;Rolling walker (2 wheels) General  ADL Comments: needing up to 5L O2 during mobility to maintain SpO2 >92    Extremity/Trunk Assessment Upper Extremity Assessment Upper Extremity Assessment: Generalized weakness (Decr shoulder flexion noted R>L)   Lower Extremity Assessment Lower Extremity Assessment: Defer to PT evaluation         Vision       Perception     Praxis      Cognition Arousal: Alert Behavior During Therapy: WFL for tasks assessed/performed Overall Cognitive Status: History of cognitive impairments - at baseline                                 General Comments: needing repeated commands inconsistently        Exercises Exercises: Other exercises Other Exercises Other Exercises: Forward puches x10 at EOB. BUE shoulder flexion at EOB x10 Other Exercises: flutter valve x8 at EOB    Shoulder Instructions       General Comments SpO2 86% sitting EOB on 4L requiring increase to 5L to maintain >90% during mobility    Pertinent Vitals/ Pain       Pain Assessment Pain Assessment: Faces Faces Pain Scale: No hurt Pain Intervention(s): Monitored during session  Home Living                                          Prior Functioning/Environment              Frequency  Min 1X/week        Progress Toward Goals  OT Goals(current goals can now be found in the care plan section)  Progress towards OT goals: Progressing toward goals  Acute Rehab OT Goals Patient Stated Goal: get better OT Goal Formulation: With patient Time For Goal Achievement: 08/16/23 Potential to Achieve Goals: Good ADL Goals Pt Will Perform Grooming: with supervision;with set-up;standing Pt Will Perform Upper Body Bathing: with set-up Pt Will Perform Lower Body Bathing: with contact guard assist;with supervision Pt Will Perform Upper Body Dressing: with set-up Pt Will Perform Lower Body Dressing: with contact guard assist;with supervision Pt Will Transfer to Toilet: with supervision;with modified independence;ambulating Pt Will Perform Toileting - Clothing Manipulation and hygiene: with contact guard assist;with supervision;sit to/from stand  Plan      Co-evaluation                 AM-PAC OT "6 Clicks" Daily Activity     Outcome Measure   Help from another  person eating meals?: None Help from another person taking care of personal grooming?: A Little Help from another person toileting, which includes using toliet, bedpan, or urinal?: A Little Help from another person bathing (including washing, rinsing, drying)?: A Lot Help from another person to put on and taking off regular upper body clothing?: A Little Help from another person to put on and taking off regular lower body clothing?: A Lot 6 Click Score: 17    End of Session Equipment Utilized During Treatment: Rolling walker (2 wheels);Gait belt  OT Visit Diagnosis: Unsteadiness on feet (R26.81);Other abnormalities of gait and mobility (R26.89);History of falling (Z91.81);Muscle weakness (generalized) (M62.81)   Activity Tolerance Patient limited by fatigue   Patient Left with call bell/phone within reach;in bed;with bed alarm set   Nurse Communication Mobility status        Time: 1610-9604 OT Time Calculation (min): 46  min  Charges: OT General Charges $OT Visit: 1 Visit OT Treatments $Self Care/Home Management : 8-22 mins $Therapeutic Activity: 8-22 mins $Therapeutic Exercise: 8-22 mins  Tyler Deis, OTR/L [facility] Acute Rehabilitation Office: [phone]   Lisa [name] 08/09/2023, 5:20 PM

## 2023-08-10 ENCOUNTER — Ambulatory Visit: Payer: Medicare Other

## 2023-08-10 ENCOUNTER — Inpatient Hospital Stay: Payer: Medicare Other

## 2023-08-10 ENCOUNTER — Ambulatory Visit: Payer: Medicare Other | Admitting: Hematology

## 2023-08-10 DIAGNOSIS — Z7189 Other specified counseling: Secondary | ICD-10-CM | POA: Diagnosis not present

## 2023-08-10 DIAGNOSIS — Z515 Encounter for palliative care: Secondary | ICD-10-CM | POA: Diagnosis not present

## 2023-08-10 LAB — CBC
HCT: 28 % — ABNORMAL LOW (ref 36.0–46.0)
Hemoglobin: 8 g/dL — ABNORMAL LOW (ref 12.0–15.0)
MCH: 29.9 pg (ref 26.0–34.0)
MCHC: 28.6 g/dL — ABNORMAL LOW (ref 30.0–36.0)
MCV: 104.5 fL — ABNORMAL HIGH (ref 80.0–100.0)
Platelets: 75 K/uL — ABNORMAL LOW (ref 150–400)
RBC: 2.68 MIL/uL — ABNORMAL LOW (ref 3.87–5.11)
RDW: 19.1 % — ABNORMAL HIGH (ref 11.5–15.5)
WBC: 6.3 K/uL (ref 4.0–10.5)
nRBC: 2.9 % — ABNORMAL HIGH (ref 0.0–0.2)

## 2023-08-10 LAB — BASIC METABOLIC PANEL
Anion gap: 11 (ref 5–15)
BUN: 23 mg/dL (ref 8–23)
CO2: 37 mmol/L — ABNORMAL HIGH (ref 22–32)
Calcium: 9 mg/dL (ref 8.9–10.3)
Chloride: 90 mmol/L — ABNORMAL LOW (ref 98–111)
Creatinine, Ser: 1.05 mg/dL — ABNORMAL HIGH (ref 0.44–1.00)
GFR, Estimated: 51 mL/min — ABNORMAL LOW (ref >60)
Glucose, Bld: 83 mg/dL (ref 70–99)
Potassium: 3.7 mmol/L (ref 3.5–5.1)
Sodium: 138 mmol/L (ref 135–145)

## 2023-08-10 LAB — GLUCOSE, CAPILLARY: Glucose-Capillary: 79 mg/dL (ref 70–99)

## 2023-08-10 MED ORDER — FUROSEMIDE 20 MG PO TABS
20.0000 mg | ORAL_TABLET | Freq: Every day | ORAL | Status: DC
  Administered 2023-08-10 – 2023-08-11 (×2): 20 mg via ORAL
  Filled 2023-08-10 (×2): qty 1

## 2023-08-10 MED ORDER — FUROSEMIDE 10 MG/ML IJ SOLN
20.0000 mg | Freq: Once | INTRAMUSCULAR | Status: AC
  Administered 2023-08-10: 20 mg via INTRAVENOUS
  Filled 2023-08-10: qty 2

## 2023-08-10 NOTE — Progress Notes (Signed)
Mobility Specialist Progress Note:   08/10/23 1628  Mobility  Activity Ambulated with assistance in room  Level of Assistance Minimal assist, patient does 75% or more  Assistive Device Front wheel walker  Distance Ambulated (ft) 5 ft  Activity Response Tolerated fair  Mobility Referral Yes  $Mobility charge 1 Mobility  Mobility Specialist Start Time (ACUTE ONLY) 1340  Mobility Specialist Stop Time (ACUTE ONLY) 1405  Mobility Specialist Time Calculation (min) (ACUTE ONLY) 25 min   Pre Mobility: 77 HR , 93% SpO2 4 L  During Mobility: 74  HR , 89-92% SpO2 4 L  Post Mobility: 74 HR , 93% SpO2 4 L   Pt received in bed, agreeable to mobility. MinA to stand. CG during ambulation. Pt displayed slight UE jerk when seated EOB. During ambulation pt only able to take a couple steps from bedside d/t leg weakness. Pt returned to bed and left supine with call bell in reach and all needs met. Family present. RN notified.   Leory Plowman  Mobility Specialist Please contact via [employer] office at [phone]

## 2023-08-10 NOTE — Plan of Care (Signed)
°  Problem: Education: Goal: Knowledge of General Education information will improve Description: Including pain rating scale, medication(s)/side effects and non-pharmacologic comfort measures Outcome: Progressing   Problem: Clinical Measurements: Goal: Ability to maintain clinical measurements within normal limits will improve Outcome: Progressing Goal: Will remain free from infection Outcome: Progressing Goal: Diagnostic test results will improve Outcome: Progressing   Problem: Clinical Measurements: Goal: Will remain free from infection Outcome: Progressing   Problem: Clinical Measurements: Goal: Diagnostic test results will improve Outcome: Progressing   

## 2023-08-10 NOTE — TOC Progression Note (Signed)
Transition of Care (TOC) - Progression Note  Donn Pierini RN, BSN Transitions of Care Unit 4E- RN Case Manager See Treatment Team for direct phone #   Patient Details  Name: Lisa [name] MRN: [id number] Date of Birth: Apr 08, 1934  Transition of Care [facility]) CM/SW Contact  Zenda Alpers, Lenn Sink, RN Phone Number: 08/10/2023, 3:16 PM  Clinical Narrative:    No family at bedside, patient resting, attempted to reach pt's POA/grandson- Dr. Richardine Service to discuss transition needs/plan. VM left requesting return call.   Note therapy continues to recommend SNF level for rehab, and AIR liaison note also states pt recommendations are for low intensity rehab. [facility] INPT rehab continues to have no bed availability at this time.  TOC will await return call from Dr Richardine Service to discuss transition options.      Barriers to Discharge: Continued Medical Work up  Expected Discharge Plan and Services In-house Referral: Clinical Social Work     Living arrangements for the past 2 months: Single Family Home                                       Social Determinants of Health (SDOH) Interventions SDOH Screenings   Food Insecurity: No Food Insecurity (08/03/2023)  Housing: Low Risk  (08/03/2023)  Transportation Needs: No Transportation Needs (08/03/2023)  Utilities: Not At Risk (08/03/2023)  Depression (PHQ2-9): Low Risk  (12/12/2020)  Physical Activity: Inactive (12/12/2020)  Tobacco Use: Medium Risk (08/01/2023)    Readmission Risk Interventions    08/03/2023   11:15 AM 07/13/2023   11:15 AM 05/24/2023    2:10 PM  Readmission Risk Prevention Plan  Transportation Screening Complete Complete Complete  HRI or Home Care Consult   Complete  Social Work Consult for Recovery Care Planning/Counseling   Complete  Palliative Care Screening   Complete  Medication Review [employer]) Referral to Pharmacy Complete Complete  PCP or Specialist appointment within 3-5 days of discharge Complete    HRI  or Home Care Consult Complete Complete   SW Recovery Care/Counseling Consult Complete Complete   Palliative Care Screening Not Applicable Not Applicable   Skilled Nursing Facility Not Applicable Not Applicable

## 2023-08-10 NOTE — Progress Notes (Signed)
Inpatient Rehab Admissions Coordinator:   Chart reviewed.  Appears pt PNA improving, though still requiring increased O2 for mobility.  CGA for mobility and up to min assist for ADLs.  Therapy recommendations continue to be for low intensity rehab.  I continue to have no beds available on CIR.   Estill Dooms, PT, DPT Admissions Coordinator [phone] 08/10/23  10:19 AM

## 2023-08-10 NOTE — Progress Notes (Addendum)
PROGRESS NOTE    Lisa [name]  WGN:[id number] DOB: [birth year] DOA: 07/31/2023 PCP: [name], MD   Brief Narrative: 89 year old with past medical history significant for chronic heart failure preserved ejection fraction, CKD stage IIIb, severe pulmonary hypertension, history of lung adenocarcinoma, MDS, CML, chronic neutropenia, moderate AAS mild MR, AAA, non-STEMI, TIA, paroxysmal A-fib not on anticoagulation, hypothyroidism, COPD with chronic hypoxia to 2-3 L of oxygen at baseline presents with worsening shortness of breath, worsening weakness and poor oral intake for the last few days  Patient had multiple recent hospitalization with hypoxic failure, sepsis and non-STEMI over the past couple of months.  Recently discharged from [facility] 07/15/2023.  Recent non-STEMI with recommendation for to manage medically.  Per family patient has been declining, over the last few days she has not been eating or drinking.  She has been having some abdominal discomfort and mild hematuria at home.  Patient admitted with hyponatremia, FTT. Develops acute hypoxic respiratory failure, acute diastolic HF exacerbation, aspiration PNA> getting IV lasix today. Plan to repeat chest x ray tomorrow.     Assessment & Plan:   Principal Problem:   Hyponatremia Active Problems:   Myelodysplastic syndrome (HCC)   Failure to thrive in adult   AKI (acute kidney injury) (HCC)   Invasive ductal carcinoma of left breast (HCC)   Adenocarcinoma of left lung (HCC)   Chronic respiratory failure with hypoxia (HCC)   CKD stage 3a, GFR 45-59 ml/min (HCC)   Abdominal aortic aneurysm   Gout   Hypothyroidism   HTN (hypertension)   Generalized muscle weakness   Malnutrition of moderate degree   Paroxysmal atrial fibrillation (HCC)   Chronic diastolic CHF (congestive heart failure) (HCC) - LVEF 60-65% by echo 07-2023   Pancytopenia (HCC)   Hyperkalemia   Nonrheumatic aortic valve stenosis   CAD (coronary artery  disease)   CMML (chronic myelomonocytic leukemia) (HCC)   Pulmonary emphysema (HCC)   Nausea and vomiting   Acute on chronic respiratory failure with hypoxia (HCC)   1-Hyponatremia: Multifactorial, prior history of SIADH, poor intake of solute concomitant high water intake, component HF.  -S/P IV fluids, didn't help.  -IV Tolvaptan without much improved.  -Nephrology assisting with management.  -Cortisol normal.  -Aldosterone renin. pending  -Received IV lasix 10/02. And 10/04 -Resolved.   Acute Metabolic Encephalopathy; -Patient was noticed to be more sleepy 10/02. -ABG obtain showed PH 7,2, o2 31. PCO2 56 -CCM consulted. Support care. Acidosis resolved.  -Spike fever, antibiotics changed to Zosyn on 10/04. -10/05: MS fluctuates, was more confuse overnight due to fever.  -MS improving, she is alert, conversant, appears less weak today than over weekend.   PNA; Concern for aspiration PNA: -10/02: Chest x ray:  Mildly increased mild patchy atelectasis or pneumonia at both lung bases with interval small areas of mild probable pneumonia in the right mid and upper lung zones. -She completed 5 days Azithromycin 10/07 -MRSA PCR negative.  -10/04: Notice to be more hypoxic, Chest x ray with progressive Pulmonary edema infiltrates. IV lasix BID ordered. Change antibiotics to Zosyn.  -Evaluated by CCM again 10/04-- for AMS and worsening hypoxia. Patient had aspiration PNA, and component of HF/  -Chest x ray 10/05: Mild improvement of moderate CHF.  Improved right and persistent left airspace diseases.  -Improving fever curve decreased.  -Plan to treat for total 7 days antibiotics, continue with Zosyn. Day 5.  -Follow chest x ray tomorrow, if improved could change to oral antibiotics to complete 7  days.   Acute on chronic Hypoxic  Respiratory failure, Acute Diastolic HF exacerbation.  -ABG with acidosis hypercapnic, hypoxic  -CCM think blood from ABG result likely venous.  -Uses 3 L  oxygen at home -Received IV lasix 10/02. Improvement of hypoxia.  -Develops worsening hypoxia 10/04. In setting of aspiration PNA and Pulmonary edema.  -10/05: Spike fever - -Started on IV lasix by nephrology, BP stable.  - Per nephrology could do lasix three times per week at discharge.  -Received oral lasix this am 20 mg. This afternoon RR 30--40. Will give her 20 mg IV lasix times one. Might need to continue with IV lasix tomorrow  -She has been requiring 3-- to 4 L oxygen  Repeat chest x ray in am to follow PNA and pulmonary edema.   Acidosis, respiratory and metabolic; Plan to monitor Bmet.  Received amp IV bicarb on 10/02.  Sodium bicarb tablet started, change to BID CCM consulted.  Resolved.   Hyperkalemia; Received IV lasix, and Veltassa.  Resolved  Failure to thrive;  In setting acute illness and malignancy, anemia.   Myelodysplastic syndrome/pancytopenia.  -Received 2 unit PRBC 10/01 for hb at 6. -Hb increased to 9 post transfusion.  -Started Folic acid.  -Monitor WBC and platelet . Suspect platelet count down due to infection.  -She is on broad spectrum antibiotics.  -Discussed with Dr Elbert Ewings, he recommend Neupogen until ANC more than 1000 -Received a dose of Neupogen 10/06. ANC: increased to 3.0. no further doses needed,  AKI on CKD stage 3a, GFR 45-59 ml/min (HCC) Appears to have CKD stage III A. -Monitor renal function -Avoid nephrotoxins -Nephrology following.  -Monitor on lasix.    Nausea vomiting.  Resolved.   Emphysema: Stable.   CMML: SP 2 units PRBC 10/01 Follows with Dr Elbert Ewings.  -08-01-2023 pt given a dose of Aranesp as recommended by oncology   CAD: S/p recent NSTEMI(07-12-2023), which was managed medically. No chest pain and troponin currently negative -Continue home aspirin, statin, metoprolol.  -BP increasing, resume lower dose imdur. 10/07   Nonrheumatic aortic valve stenosis  Follow out patient cardiology  Malnutrition  moderate degree On supplement.   Generalized  muscle weakness Multifactorial.   HTN: on metoprolol and imdur.   Hypothyroidism; on synthroid.   Gout: Stable.   Abdominal aortic aneurysm CT abdomen with abdominal aneurysm of 5.1 cm -Need 36-month follow-up -Outpatient vascular surgery evaluation   Adenocarcinoma of left lung (HCC) Followed by oncology as an outpatient.   Invasive ductal carcinoma of left breast (HCC) History of lung cancer History of CMML Patient with multiple malignancies.  LDH elevated -Being managed by oncology as outpatient -Patient gets regular colony-stimulating and erythropoietin stimulating agents by oncology -Palliative care consulted.    Nutrition Problem: Moderate Malnutrition Etiology: chronic illness    Signs/Symptoms: moderate fat depletion, moderate muscle depletion, energy intake < or equal to 50% for > or equal to 1 month    Interventions: Ensure Enlive (each supplement provides 350kcal and 20 grams of protein), Magic cup, Liberalize Diet  Estimated body mass index is 26.48 kg/m as calculated from the following:   Height as of this encounter: 5\' 3"  (1.6 m).   Weight as of this encounter: 67.8 kg.   DVT prophylaxis: Holding Lovenox 10/08 due to episode of epistaxis on 10/07. Resume if platelt continue to be stable and no further epistaxis.  Code Status: Full code Family Communication: Grandson over phone. 10/08 Disposition Plan:  Status is: Inpatient Remains inpatient appropriate because: Plan for  PT evaluation today     Consultants:  CCM Nephrology Palliative care  Procedures:    Antimicrobials:    Subjective: Patient seem this am. She denies worsening dyspnea, she think she is breathing well,  not coughing that much.  This afternoon RR 30--40, denies worsening dyspnea, then RR down to 20. Will proceed wioth dose of IV lasix.   Discussed with grandson and patient ' s Son. They would like for Mrs Soung to go to  CIR. Dr. Carlis Abbott  from CIR think patient could qualify. Message send to [facility] CM. I will also placed order for  repeat PT eval for tomorrow.    Objective: Vitals:   08/10/23 0000 08/10/23 0309 08/10/23 0603 08/10/23 0800  BP:  (!) 132/55    Pulse:  81 84   Resp: (!) 21 (!) 21 (!) 21   Temp:  97.8 F (36.6 C)  98.7 F (37.1 C)  TempSrc:  Oral  Oral  SpO2: 93% 94% 94%   Weight:   67.8 kg   Height:        Intake/Output Summary (Last 24 hours) at 08/10/2023 0930 Last data filed at 08/10/2023 0555 Gross per 24 hour  Intake 860 ml  Output 1130 ml  Net -270 ml   Filed Weights   08/08/23 0500 08/09/23 0500 08/10/23 0603  Weight: 67.3 kg 67 kg 67.8 kg    Examination:  General exam: NAD Respiratory system Normal resp effort, crackles bases.  Cardiovascular system: S 1, S 2 RRR Gastrointestinal system: BBS present, soft, nt Extremities: no edema  Data Reviewed: I have personally reviewed following labs and imaging studies  CBC: Recent Labs  Lab 08/04/23 0439 08/06/23 0409 08/07/23 0347 08/07/23 1447 08/08/23 0611 08/09/23 0645 08/10/23 0728  WBC 3.3*   < > 3.7* 4.7 3.2* 8.3 6.3  NEUTROABS 0.2*  --   --  0.6* 0.2* 3.0  --   HGB 9.9*   < > 8.9* 8.3* 7.6* 8.5* 8.0*  HCT 33.4*   < > 30.0* 28.8* 26.5* 29.9* 28.0*  MCV 97.9   < > 100.0 101.1* 102.7* 102.7* 104.5*  PLT 112*   < > 109* 100* 85* 79* 75*   < > = values in this interval not displayed.   Basic Metabolic Panel: Recent Labs  Lab 08/06/23 0409 08/07/23 0347 08/08/23 0611 08/09/23 0645 08/10/23 0728  NA 131* 132* 133* 137 138  K 4.5 3.8 3.5 3.8 3.7  CL 93* 89* 91* 89* 90*  CO2 29 32 34* 35* 37*  GLUCOSE 101* 107* 77 82 83  BUN 18 20 27* 29* 23  CREATININE 1.04* 1.07* 1.31* 1.12* 1.05*  CALCIUM 8.5* 9.0 8.6* 9.0 9.0   GFR: Estimated Creatinine Clearance: 34.9 mL/min (A) (by C-G formula based on SCr of 1.05 mg/dL (H)). Liver Function Tests: No results for input(s): "AST", "ALT", "ALKPHOS", "BILITOT",  "PROT", "ALBUMIN" in the last 168 hours.  No results for input(s): "LIPASE", "AMYLASE" in the last 168 hours.  No results for input(s): "AMMONIA" in the last 168 hours. Coagulation Profile: No results for input(s): "INR", "PROTIME" in the last 168 hours.  Cardiac Enzymes: No results for input(s): "CKTOTAL", "CKMB", "CKMBINDEX", "TROPONINI" in the last 168 hours. BNP (last 3 results) No results for input(s): "PROBNP" in the last 8760 hours. HbA1C: No results for input(s): "HGBA1C" in the last 72 hours. CBG: Recent Labs  Lab 08/07/23 0556 08/07/23 0951 08/08/23 0547 08/09/23 0558 08/10/23 0601  GLUCAP 91 91 79 82 79  Lipid Profile: No results for input(s): "CHOL", "HDL", "LDLCALC", "TRIG", "CHOLHDL", "LDLDIRECT" in the last 72 hours. Thyroid Function Tests: No results for input(s): "TSH", "T4TOTAL", "FREET4", "T3FREE", "THYROIDAB" in the last 72 hours.  Anemia Panel: No results for input(s): "VITAMINB12", "FOLATE", "FERRITIN", "TIBC", "IRON", "RETICCTPCT" in the last 72 hours. Sepsis Labs: Recent Labs  Lab 08/07/23 0546  PROCALCITON <0.10  LATICACIDVEN 0.9    Recent Results (from the past 240 hour(s))  Resp panel by RT-PCR (RSV, Flu A&B, Covid) Anterior Nasal Swab     Status: None   Collection Time: 07/31/23 11:45 PM   Specimen: Anterior Nasal Swab  Result Value Ref Range Status   SARS Coronavirus 2 by RT PCR NEGATIVE NEGATIVE Final   Influenza A by PCR NEGATIVE NEGATIVE Final   Influenza B by PCR NEGATIVE NEGATIVE Final    Comment: (NOTE) The Xpert Xpress SARS-CoV-2/FLU/RSV plus assay is intended as an aid in the diagnosis of influenza from Nasopharyngeal swab specimens and should not be used as a sole basis for treatment. Nasal washings and aspirates are unacceptable for Xpert Xpress SARS-CoV-2/FLU/RSV testing.  Fact Sheet for Patients: BloggerCourse.com  Fact Sheet for Healthcare  Providers: SeriousBroker.it  This test is not yet approved or cleared by the [place] FDA and has been authorized for detection and/or diagnosis of SARS-CoV-2 by FDA under an Emergency Use Authorization (EUA). This EUA will remain in effect (meaning this test can be used) for the duration of the COVID-19 declaration under Section 564(b)(1) of the Act, 21 U.S.C. section 360bbb-3(b)(1), unless the authorization is terminated or revoked.     Resp Syncytial Virus by PCR NEGATIVE NEGATIVE Final    Comment: (NOTE) Fact Sheet for Patients: BloggerCourse.com  Fact Sheet for Healthcare Providers: SeriousBroker.it  This test is not yet approved or cleared by the [place] FDA and has been authorized for detection and/or diagnosis of SARS-CoV-2 by FDA under an Emergency Use Authorization (EUA). This EUA will remain in effect (meaning this test can be used) for the duration of the COVID-19 declaration under Section 564(b)(1) of the Act, 21 U.S.C. section 360bbb-3(b)(1), unless the authorization is terminated or revoked.  Performed at [facility], [address], [place], [place] [address]   Blood culture (routine x 2)     Status: None   Collection Time: 07/31/23 11:52 PM   Specimen: BLOOD  Result Value Ref Range Status   Specimen Description BLOOD RIGHT ANTECUBITAL  Final   Special Requests   Final    BOTTLES DRAWN AEROBIC AND ANAEROBIC Blood Culture adequate volume   Culture   Final    NO GROWTH 5 DAYS Performed at [facility], [address], [place], [place] [address]    Report Status 08/06/2023 FINAL  Final  Blood culture (routine x 2)     Status: None   Collection Time: 08/01/23 12:49 AM   Specimen: BLOOD LEFT FOREARM  Result Value Ref Range Status   Specimen Description BLOOD LEFT FOREARM  Final   Special Requests   Final    BOTTLES DRAWN AEROBIC AND ANAEROBIC Blood Culture  adequate volume   Culture   Final    NO GROWTH 5 DAYS Performed at [facility], [address] [place], [place] [address]    Report Status 08/06/2023 FINAL  Final  MRSA Next Gen by PCR, Nasal     Status: None   Collection Time: 08/05/23  3:56 PM   Specimen: Nasal Mucosa; Nasal Swab  Result  Value Ref Range Status   MRSA by PCR Next Gen NOT DETECTED NOT DETECTED Final    Comment: (NOTE) The GeneXpert MRSA Assay (FDA approved for NASAL specimens only), is one component of a comprehensive MRSA colonization surveillance program. It is not intended to diagnose MRSA infection nor to guide or monitor treatment for MRSA infections. Test performance is not FDA approved in patients less than 82 years old. Performed at [facility], [address], [place], [place] [address]   Culture, blood (Routine X 2) w Reflex to ID Panel     Status: None (Preliminary result)   Collection Time: 08/07/23  6:41 AM   Specimen: BLOOD RIGHT HAND  Result Value Ref Range Status   Specimen Description BLOOD RIGHT HAND  Final   Special Requests   Final    BOTTLES DRAWN AEROBIC AND ANAEROBIC Blood Culture results may not be optimal due to an excessive volume of blood received in culture bottles   Culture   Final    NO GROWTH 2 DAYS Performed at [facility], [address], [place], [place] [address]    Report Status PENDING  Incomplete  Culture, blood (Routine X 2) w Reflex to ID Panel     Status: None (Preliminary result)   Collection Time: 08/07/23  6:42 AM   Specimen: BLOOD RIGHT HAND  Result Value Ref Range Status   Specimen Description BLOOD RIGHT HAND  Final   Special Requests   Final    BOTTLES DRAWN AEROBIC AND ANAEROBIC Blood Culture adequate volume   Culture   Final    NO GROWTH 2 DAYS Performed at [facility], [address] [place], [place] [address]    Report Status PENDING  Incomplete         Radiology Studies: No results found.      Scheduled Meds:   aspirin EC  81 mg Oral Daily   Chlorhexidine Gluconate Cloth  6 each Topical Daily   feeding supplement (NEPRO CARB STEADY)  237 mL Oral BID BM   fluticasone furoate-vilanterol  1 puff Inhalation Daily   And   umeclidinium bromide  1 puff Inhalation Daily   folic acid  1 mg Oral Daily   furosemide  20 mg Oral Daily   guaiFENesin  600 mg Oral BID   isosorbide mononitrate  30 mg Oral Daily   levothyroxine  100 mcg Oral Q0600   magnesium oxide  200 mg Oral QHS   melatonin  3 mg Oral QHS   metoprolol succinate  25 mg Oral Daily   multivitamin with minerals  1 tablet Oral Daily   mouth rinse  15 mL Mouth Rinse 4 times per day   pravastatin  40 mg Oral QPM   psyllium  1 packet Oral Daily   senna  1 tablet Oral QHS   Continuous Infusions:  piperacillin-tazobactam (ZOSYN)  IV 3.375 g (08/10/23 0552)     LOS: 9 days    Time spent: 35 minutes    Romie Tay A Dona Walby, MD [facility]   If 7PM-7AM, please contact night-coverage [email or web address]  08/10/2023, 9:30 AM

## 2023-08-11 ENCOUNTER — Inpatient Hospital Stay (HOSPITAL_COMMUNITY): Payer: Medicare Other

## 2023-08-11 DIAGNOSIS — Z7189 Other specified counseling: Secondary | ICD-10-CM | POA: Diagnosis not present

## 2023-08-11 DIAGNOSIS — E871 Hypo-osmolality and hyponatremia: Secondary | ICD-10-CM | POA: Diagnosis not present

## 2023-08-11 DIAGNOSIS — Z515 Encounter for palliative care: Secondary | ICD-10-CM | POA: Diagnosis not present

## 2023-08-11 LAB — CBC
HCT: 25.9 % — ABNORMAL LOW (ref 36.0–46.0)
Hemoglobin: 7.7 g/dL — ABNORMAL LOW (ref 12.0–15.0)
MCH: 29.7 pg (ref 26.0–34.0)
MCHC: 29.7 g/dL — ABNORMAL LOW (ref 30.0–36.0)
MCV: 100 fL (ref 80.0–100.0)
Platelets: 66 10*3/uL — ABNORMAL LOW (ref 150–400)
RBC: 2.59 MIL/uL — ABNORMAL LOW (ref 3.87–5.11)
RDW: 18.9 % — ABNORMAL HIGH (ref 11.5–15.5)
WBC: 5.6 10*3/uL (ref 4.0–10.5)
nRBC: 4.1 % — ABNORMAL HIGH (ref 0.0–0.2)

## 2023-08-11 LAB — VITAMIN B12: Vitamin B-12: 947 pg/mL — ABNORMAL HIGH (ref 180–914)

## 2023-08-11 LAB — BASIC METABOLIC PANEL
Anion gap: 15 (ref 5–15)
BUN: 21 mg/dL (ref 8–23)
CO2: 36 mmol/L — ABNORMAL HIGH (ref 22–32)
Calcium: 9.3 mg/dL (ref 8.9–10.3)
Chloride: 88 mmol/L — ABNORMAL LOW (ref 98–111)
Creatinine, Ser: 1.18 mg/dL — ABNORMAL HIGH (ref 0.44–1.00)
GFR, Estimated: 45 mL/min — ABNORMAL LOW (ref >60)
Glucose, Bld: 84 mg/dL (ref 70–99)
Potassium: 3.7 mmol/L (ref 3.5–5.1)
Sodium: 139 mmol/L (ref 135–145)

## 2023-08-11 LAB — VITAMIN D 25 HYDROXY (VIT D DEFICIENCY, FRACTURES): Vit D, 25-Hydroxy: 48.71 ng/mL (ref 30–100)

## 2023-08-11 LAB — IRON AND TIBC
Iron: 56 ug/dL (ref 28–170)
Saturation Ratios: 23 % (ref 10.4–31.8)
TIBC: 249 ug/dL — ABNORMAL LOW (ref 250–450)
UIBC: 193 ug/dL

## 2023-08-11 LAB — GLUCOSE, CAPILLARY: Glucose-Capillary: 82 mg/dL (ref 70–99)

## 2023-08-11 MED ORDER — FUROSEMIDE 20 MG PO TABS
20.0000 mg | ORAL_TABLET | Freq: Two times a day (BID) | ORAL | Status: DC
  Administered 2023-08-11 – 2023-08-12 (×3): 20 mg via ORAL
  Filled 2023-08-11 (×4): qty 1

## 2023-08-11 MED ORDER — ISOSORBIDE MONONITRATE ER 30 MG PO TB24
15.0000 mg | ORAL_TABLET | Freq: Every day | ORAL | Status: DC
  Administered 2023-08-12 – 2023-08-16 (×5): 15 mg via ORAL
  Filled 2023-08-11 (×6): qty 1

## 2023-08-11 NOTE — Progress Notes (Signed)
Physical Therapy Treatment Patient Details Name: Lisa [name] MRN: [id number] DOB: 10/17/1934 Today's Date: 08/11/2023   History of Present Illness 89 y.o. female presents to [facility] hospital on 07/31/2023 with SOB, weakness and poor PO intake. Pt found to be hyponatremic. Pt received 2 units PRBC on 10/1 AM. Pt has recently been hospitalized on November 05, 2022 and then April 07, 2023 at which time pt attended AIR improving from Mod A to Min A for functional mobility and returning home. Pt was then re-hospitalized on 07/14/23 and discharged home at [facility] ambulating 60 ft with RW. Pt was then re-hospitalized for this admission on 08/02/23. PMH includes chronic HFpEF, CKD III, PAH, lung adenocarcinoma, CML, moderate AS, AAA, NSTEMI, TIA, PAF.    PT Comments  Pt received in supine and agreeable to session. Pt able to perform all mobility tasks with up to min A and dense cues for sequencing. Pt able to tolerate increased gait distance this session with one seated rest break due to fatigue and shakiness. SpO2 briefly increased to 6L, however pt was able to ambulate on 4L with sats >90% with brief intermittent drop to 88%. Pt continues to benefit from PT services to progress toward functional mobility goals.    If plan is discharge home, recommend the following: A little help with walking and/or transfers;A little help with bathing/dressing/bathroom;Assistance with cooking/housework;Direct supervision/assist for medications management;Direct supervision/assist for financial management;Help with stairs or ramp for entrance;Assist for transportation   Can travel by private vehicle     Yes  Equipment Recommendations  None recommended by PT    Recommendations for Other Services       Precautions / Restrictions Precautions Precautions: Fall;Other (comment) Precaution Comments: monitor SpO2 Restrictions Weight Bearing Restrictions: No     Mobility  Bed Mobility Overal bed mobility: Needs Assistance Bed  Mobility: Supine to Sit, Sit to Supine     Supine to sit: Contact guard, HOB elevated, Used rails Sit to supine: Contact guard assist, HOB elevated   General bed mobility comments: increased time and cues for technique. Pt requesting assist to elevate BLE to EOB, but is able to complete without assist with encouragement    Transfers Overall transfer level: Needs assistance Equipment used: Rolling walker (2 wheels) Transfers: Sit to/from Stand Sit to Stand: Min assist           General transfer comment: From EOB and recliner with min A for power up and cues for hand placement    Ambulation/Gait Ambulation/Gait assistance: Contact guard assist, +2 safety/equipment Gait Distance (Feet): 30 Feet (x2) Assistive device: Rolling walker (2 wheels) Gait Pattern/deviations: Step-through pattern, Trunk flexed, Decreased stride length Gait velocity: reduced     General Gait Details: slow, unsteady gait with increased shaking with progressed distance. +2 chair follow for safety. Seated rest break due to fatigue      Balance Overall balance assessment: Needs assistance Sitting-balance support: No upper extremity supported, Feet supported Sitting balance-Leahy Scale: Fair Sitting balance - Comments: sitting EOB   Standing balance support: Reliant on assistive device for balance, Bilateral upper extremity supported, During functional activity Standing balance-Leahy Scale: Poor Standing balance comment: with RW support                            Cognition Arousal: Alert Behavior During Therapy: WFL for tasks assessed/performed Overall Cognitive Status: History of cognitive impairments - at baseline  Exercises      General Comments General comments (skin integrity, edema, etc.): SpO2 85% sitting EOB on 4L requiring increase to 6L for improvement. Able to reduce O2 back to 4L during ambulation with sats remaining  >88%. Noted improvement in O2 sats during ambulation compared to seated rest breaks.      Pertinent Vitals/Pain Pain Assessment Pain Assessment: No/denies pain     PT Goals (current goals can now be found in the care plan section) Acute Rehab PT Goals Patient Stated Goal: not stated PT Goal Formulation: With patient Time For Goal Achievement: 08/16/23 Progress towards PT goals: Progressing toward goals    Frequency    Min 1X/week       AM-PAC PT "6 Clicks" Mobility   Outcome Measure  Help needed turning from your back to your side while in a flat bed without using bedrails?: A Little Help needed moving from lying on your back to sitting on the side of a flat bed without using bedrails?: A Little Help needed moving to and from a bed to a chair (including a wheelchair)?: A Little Help needed standing up from a chair using your arms (e.g., wheelchair or bedside chair)?: A Little Help needed to walk in hospital room?: A Little Help needed climbing 3-5 steps with a railing? : Total 6 Click Score: 16    End of Session Equipment Utilized During Treatment: Gait belt;Oxygen Activity Tolerance: Patient limited by fatigue;Patient tolerated treatment well Patient left: in bed;with call bell/phone within reach;with family/visitor present;with bed alarm set Nurse Communication: Mobility status PT Visit Diagnosis: Other abnormalities of gait and mobility (R26.89);Muscle weakness (generalized) (M62.81)     Time: 1610-9604 PT Time Calculation (min) (ACUTE ONLY): 43 min  Charges:    $Gait Training: 23-37 mins $Therapeutic Activity: 8-22 mins PT General Charges $$ ACUTE PT VISIT: 1 Visit                     Johny Shock, PTA Acute Rehabilitation Services Secure Chat Preferred  [phone]    Johny Shock 08/11/2023, 2:28 PM

## 2023-08-11 NOTE — TOC Progression Note (Signed)
Transition of Care (TOC) - Progression Note  Donn Pierini RN, BSN Transitions of Care Unit 4E- RN Case Manager See Treatment Team for direct phone #   Patient Details  Name: Lisa [name] MRN: [id number] Date of Birth: 11-28-1933  Transition of Care [facility]) CM/SW Contact  Zenda Alpers Lenn Sink, RN Phone Number: 08/11/2023, 3:46 PM  Clinical Narrative:    Received return call this am from patient's POA/grandson Georgiana Shore. Dr Richardine Service is in [place] so there is a time difference when calling to speak with him.  Per TC with Dr. Richardine Service he is still asking for CIR consideration, voiced that he would like Dr. Carlis Abbott to give him a call to discuss AIR option and for a consult note to be placed in chart. CM explained that team would reach out to CIR to relay his request for Dr. Carlis Abbott to call him.  CM also explained that [facility] does not currently have beds available and will not be able to offer a bed at this time. Also tried to explain that current therapy recommendations are for SNF level (less intense) rehab- however Dr Richardine Service is adamant that he needs to speak with Dr. Carlis Abbott to "get everyone on the same page".  Per Dr. Richardine Service he would like to wait on any further discussions until recommendations have been addressed. Dr. Richardine Service did indicate that [facility] would be family first choice, followed by [facility].   CM has spoken with CIR liaison to relay request for Dr. Carlis Abbott to reach out to Dr. Richardine Service to discuss CIR recommendations. CM will await formal note, and reach out again to further discuss rehab choice.   1400- Noted consult note has been placed by Dr. Carlis Abbott, Cm attempted to reach out to POA/grandson to further confirm transition plan and choice as to where family would like AIR referral faxed to- VM left and awaiting  return call.      Barriers to Discharge: Continued Medical Work up  Expected Discharge Plan and Services In-house Referral: Clinical  Social Work     Living arrangements for the past 2 months: Single Family Home                                       Social Determinants of Health (SDOH) Interventions SDOH Screenings   Food Insecurity: No Food Insecurity (08/03/2023)  Housing: Low Risk  (08/03/2023)  Transportation Needs: No Transportation Needs (08/03/2023)  Utilities: Not At Risk (08/03/2023)  Depression (PHQ2-9): Low Risk  (12/12/2020)  Physical Activity: Inactive (12/12/2020)  Tobacco Use: Medium Risk (08/01/2023)    Readmission Risk Interventions    08/03/2023   11:15 AM 07/13/2023   11:15 AM 05/24/2023    2:10 PM  Readmission Risk Prevention Plan  Transportation Screening Complete Complete Complete  HRI or Home Care Consult   Complete  Social Work Consult for Recovery Care Planning/Counseling   Complete  Palliative Care Screening   Complete  Medication Review [employer]) Referral to Pharmacy Complete Complete  PCP or Specialist appointment within 3-5 days of discharge Complete    HRI or Home Care Consult Complete Complete   SW Recovery Care/Counseling Consult Complete Complete   Palliative Care Screening Not Applicable Not Applicable   Skilled Nursing Facility Not Applicable Not Applicable

## 2023-08-11 NOTE — Progress Notes (Signed)
HOSPITALIST ROUNDING NOTE Lisa [name] WGN:[id number]  DOB: 08/31/1934  DOA: 07/31/2023  PCP: [name], MD  08/11/2023,8:32 AM   LOS: 10 days      Code Status: Full From: Home  current Dispo: Eventual skilled     89 year old black female known COPD complicated by chronic respiratory failure on 2 L of oxygen CKD 3B Paroxysmal A-fib not on anticoagulation secondary to prior GI bleed Moderate AS mild MR with severe pulmonary hypertension (group 2) Myelodysplastic syndrome with CML nature follows with [facility] oncology Dr. [name]  LUL lobectomy and breast cancer 4.8 cm AAA not a candidate for repair Prior endoscopy 10/23/2020 hiatal hernia normal esophagus Rx H. pylori at the time-on evaluation 04/2023 for reported dark stool it was decided to hold off on evaluation of colon because of high risk and NSTEMI  Recently discharged after 9/9-9/12 hospitalization with acute on chronic respiratory failure and NSTEMI with bilateral effusions was treated with Lasix and heparin gtt. at the time  Re-presented to the emergency room shortness of breath not eating drinking progressively more weak Sodium 118, potassium 5.5, Lokelma started, treated with IV fluid tolvaptan given 9/29 nephrology consulted 10/1 transfused 2 units 10/2 more lethargic CCM consulted 10/6 Neupogen started after discussion with oncologist Dr. [name]   Plan  Multifactorial hyponatremia-prior SIADH, had hypovolemic hyponatrmia on admission Cortisol normal --aldosterone renin ratio 2.3 has now seemingly improved Periodic labs as improved  Concern for aspiration CXR 10/2 Waxing waning symptoms with hypoxia increasing lethargy on 10/2 as well as on 10/4 but now seems better Last CXR 10/9 showed moderate cardiomegaly with mild interstitial thickening and small effusions bilaterally completed antibiotics 10/8 Watch closely for aspiration-SLP last saw on 10/7 recommended dysphagia 3  Metabolic encephalopathy Improved with  treatment of pneumonia Is quite hard of hearing but seems coherent today  Acute hypoxic respiratory failure on arrival secondary to diastolic heart failure + pneumonia Nephrology engaged and recommending Lasix--currently on lasix 20 daily po--net is even---will dose lasix at 20 bid for 3 days and then decrease dose  Myelodysplastic syndrome/prior lung cancer with lobectomy, prior breast cancer--underlying pancytopenia as well Stage I breast cancer in 1999 with lumpectomy received tamoxifen for 5 years-last mammogram etc. as per oncologist Stage I adenocarcinoma LUL lobectomy 2006 She is at high risk for dysplastic MDS CMML to her last platelets in the outpatient setting were 57-she is not really a good candidate per last oncology notes for hypomethylating agents but is a candidate for ESA She has not had a differential count in the past 48 hours but last trends were upwards I will discontinue contact/neutropenic precautions Platelet counts are stable in the upper 50-70 range-SCDs  4.8 cm AAA not candidate for repair Advanced age etc. precludes  Paroxysmal A-fib not a candidate for anticoagulation?  GI bleed in the past Group 2 Pulmonary hypertension moderate AS mild MR Continues on metoprolol XL 25 daily Imdur 30 Apparently is not a candidate for anticoagulation given prior bleed although I do not see a history Continues on aspirin 81 daily   Chronic respiratory failure on 2 L of oxygen Respiratory failure secondary to diastolic heart failure-uses 3 L of oxygen at home-aim to keep dry as has intermittent hypoxia  CKD 3A Hyperkalemic on admission and was given Veltassa and now has been discontinued  Multifactorial anemia--macrocytic kinetics--iron 67 saturation ratio is 24 As above--gets ESA at oncology office--- folic acid started here--- given elevated folate levels likely can hold   DVT prophylaxis:   SCD  Status is: Inpatient Remains inpatient appropriate because:   Still  somewhat volume overloaded needs gentle diuresis    Subjective: She is awake coherent but quite hard of hearing-she thinks that she has had both breakfast and lunch where she is only at breakfast She complains of no chest pain no fever At the bedside she coughs a little bit but she is not having a lot of sputum She can tell me that this is October and that it is 2024   Objective + exam Vitals:   08/10/23 2245 08/11/23 0000 08/11/23 0439 08/11/23 0800  BP: (!) 104/45 (!) 143/79 (!) 123/58 (!) 126/55  Pulse: 73 83 70 79  Resp: 20  (!) 41 (!) 24  Temp: 98.9 F (37.2 C)  98.8 F (37.1 C) 98.9 F (37.2 C)  TempSrc: Axillary  Oral Oral  SpO2: 92% 96% 94% 94%  Weight:   66.4 kg   Height:       Filed Weights   08/09/23 0500 08/10/23 0603 08/11/23 0439  Weight: 67 kg 67.8 kg 66.4 kg    Examination:  EOMI NCAT moderate dentition Holosystolic murmur LLSE Quite frail Decreased air entry posterolaterally on the right side Abdomen soft no rebound Trace lower extremity edema only Power 5/5  Data Reviewed: reviewed   CBC    Component Value Date/Time   WBC 5.6 08/11/2023 0342   RBC 2.59 (L) 08/11/2023 0342   HGB 7.7 (L) 08/11/2023 0342   HCT 25.9 (L) 08/11/2023 0342   HCT 30.0 (L) 05/14/2021 1446   PLT 66 (L) 08/11/2023 0342   MCV 100.0 08/11/2023 0342   MCH 29.7 08/11/2023 0342   MCHC 29.7 (L) 08/11/2023 0342   RDW 18.9 (H) 08/11/2023 0342   LYMPHSABS 1.2 08/09/2023 0645   MONOABS 3.7 (H) 08/09/2023 0645   EOSABS 0.0 08/09/2023 0645   BASOSABS 0.0 08/09/2023 0645      Latest Ref Rng & Units 08/11/2023    3:42 AM 08/10/2023    7:28 AM 08/09/2023    6:45 AM  CMP  Glucose 70 - 99 mg/dL 84  83  82   BUN 8 - 23 mg/dL 21  23  29    Creatinine 0.44 - 1.00 mg/dL 4.69  6.29  5.28   Sodium 135 - 145 mmol/L 139  138  137   Potassium 3.5 - 5.1 mmol/L 3.7  3.7  3.8   Chloride 98 - 111 mmol/L 88  90  89   CO2 22 - 32 mmol/L 36  37  35   Calcium 8.9 - 10.3 mg/dL 9.3  9.0  9.0       Scheduled Meds:  aspirin EC  81 mg Oral Daily   Chlorhexidine Gluconate Cloth  6 each Topical Daily   feeding supplement (NEPRO CARB STEADY)  237 mL Oral BID BM   fluticasone furoate-vilanterol  1 puff Inhalation Daily   And   umeclidinium bromide  1 puff Inhalation Daily   furosemide  20 mg Oral BID   guaiFENesin  600 mg Oral BID   isosorbide mononitrate  30 mg Oral Daily   levothyroxine  100 mcg Oral Q0600   melatonin  3 mg Oral QHS   metoprolol succinate  25 mg Oral Daily   multivitamin with minerals  1 tablet Oral Daily   mouth rinse  15 mL Mouth Rinse 4 times per day   pravastatin  40 mg Oral QPM   psyllium  1 packet Oral Daily   senna  1  tablet Oral QHS   Continuous Infusions:  piperacillin-tazobactam (ZOSYN)  IV 3.375 g (08/11/23 0456)    Time  47  Rhetta Mura, MD  [facility]

## 2023-08-11 NOTE — Progress Notes (Signed)
Mobility Specialist Progress Note:   08/11/23 1216  Mobility  Activity Transferred from bed to chair  Level of Assistance Moderate assist, patient does 50-74% (+2)  Assistive Device Front wheel walker  Activity Response Tolerated well  Mobility Referral Yes  $Mobility charge 1 Mobility  Mobility Specialist Start Time (ACUTE ONLY) 1040  Mobility Specialist Stop Time (ACUTE ONLY) 1100  Mobility Specialist Time Calculation (min) (ACUTE ONLY) 20 min   During Mobility: 93% SpO2 4 L Post Mobility: 75 HR , 92% SpO2 4 L  Pt received in bed, agreeable to transfer to chair per RN request d/t soiled bed. Pt able to stand with ModA while RN assisted with pericare. Standing tolerance limited d/t fatigue and general weakness. After seated rest pt able to stand and pivot to chair with modA+2 for line management. Pt left in chair with RN present in room.   Leory Plowman  Mobility Specialist Please contact via [employer] office at [phone]

## 2023-08-11 NOTE — Progress Notes (Addendum)
Palliative Medicine Inpatient Follow Up Note HPI: Lisa Lisa [name] is a 89 y.o. female with medical history significant of  chronic HFpEF, CKD stage IIIb, severe pulmonary hypertension, history of lung adenocarcinoma, MDS --> CML, chronic neutropenia, moderate AS/mild MR, AAA, NSTEMI, TIA, paroxysmal atrial fibrillation-not on anticoagulation, hypothyroidism, and COPD with chronic hypoxemia 2-3 L baseline oxygen use,  presented to the Lisa [name] with worsening shortness of breath, worsening weakness, and poor p.o. intake for the past few days.    Palliative care has been asked to get involved to assist with additional goals of care conversations.   Today's Discussion 08/11/2023  *Please note that this is a verbal dictation therefore any spelling or grammatical errors are due to the "Dragon Medical One" system interpretation.  Chart reviewed inclusive of vital signs, progress notes, laboratory results, and diagnostic images. Sodium level at  139. PNA management per primary. PO's are variable.   Patients RN, Lisa Lisa [name] Lisa [name] that Lisa Lisa [name] is doing well though intermittently forgetful. She does not endorse any significant nursing concerns.   I met with Lisa Lisa [name] at bedside this morning. She is awake and alert to self and place. She does not exemplify any signs of distress.   Plan for placement at SNF.   No Lisa [name] present at bedside though I will call patients daughter to provide a daily update.    Questions and concerns answered.   Plan for ongoing peripheral Palliative support. _____________________________________ Addendum:  I spoke to patients HCPOA this late morning, Lisa Lisa [name] with [name] the hope for Lisa Lisa [name] to go to CIR. WE reviewed Lisa Lisa [name] and over the past few Lisa [name]. [name] feels strongly that [name] is advocating for Lisa Lisa [name] and her wishes in terms of her quality of life and goals of care. [name] Lisa [name] that Lisa Lisa [name] from a strength  perspective which is why [name] is pressing for acute rehab to be a reality.  Lisa Lisa [name] som Lisa [name] with the journey to getting Lisa Lisa [name]. We discussed that I would coordinate with the primary team, rehab medicine team, rehab medicine coordinator to see what would be possible.  I shared that I do not have enough insight to share in terms of why Lisa Lisa [name] does not or has not been approved for a bed at this time though I will reach out to ain clarity for Lisa Lisa [name].  Lisa Lisa [name] that [name] would Lisa [name] to speak to Dr. Carlis Abbott which I shared I would directly inform her of this.  Palliative care remains involved to support Lisa Lisa [name].   Additional Time: 25  Objective Assessment: Vital Signs Vitals:   08/11/23 0439 08/11/23 0800  BP: (!) 123/58 (!) 126/55  Pulse: 70 79  Resp: (!) 41 (!) 24  Temp: 98.8 F (37.1 C) 98.9 F (37.2 C)  SpO2: 94% 94%    Intake/Output Summary (Last 24 hours) at 08/11/2023 0845 Last data filed at 08/11/2023 0456 Gross per 24 hour  Intake 700.04 ml  Output 876 ml  Net -175.96 ml   Last Weight  Most recent update: 08/11/2023  4:42 AM    Weight  66.4 kg (146 lb 6.2 oz)            Gen: Elderly AA F in NAD HEENT: moist mucous membranes CV: Regular rate and rhythm  PULM: On 4LPM , breathing is nonlabored  ABD: soft/nontender  EXT: No edema  Neuro: Alert and oriented x2  SUMMARY OF RECOMMENDATIONS  Full code at this time --> Patient and her Lisa [name] will review MOST form on their own accord. Patient herself does not feel she would Lisa [name] CPR, shocks, or intubation though Lisa [name] wanted to discuss it further Lisa [name] to changing code status.   Advanced Directives have been obtained and will be scanned into Vynca  Continue to treat the treatable  Patient would benefit from additional conversations with Oncologist - Dr. [name] in the setting of underlying malignancies to better address expectations moving  forward   Plan for medical optimization and patient to transition to skilled nursing home  Incremental PMT support  Time: 25  ______________________________________________________________________________________ Lamarr Lulas [name] Palliative Medicine Team Team Cell Phone: [phone] Please utilize secure chat with additional questions, if there is no response within 30 minutes please call the above phone number  Palliative Medicine Team providers are available by phone from 7am to 7pm daily and can be reached through the team cell phone.  Should this patient require assistance outside of these hours, please call the patient's attending physician.

## 2023-08-11 NOTE — Consult Note (Signed)
Physical Medicine and Rehabilitation Consult Reason for Consult: Assess candidacy for CIR Referring Physician: Rhetta Mura, MD   HPI: Lisa [name] is a 89 y.o. female with known COPD complicated by chronic respiratory failure on 2L O2 who presented to the ED with shortness of breath and poor appetite for a week. She was treated for pneumonia and hyponatremia but has continued debility/fatigue. Physical Medicine & Rehabilitation was consulted to assess candidacy for CIR.      ROS +fatigue Past Medical History:  Diagnosis Date   AAA (abdominal aortic aneurysm) (HCC)    Adenocarcinoma of left lung (HCC) 2006   AF (paroxysmal atrial fibrillation) (HCC)    Aortic stenosis    Arthritis    Asthma    Cancer of breast, female (HCC)    Cancer of lung (HCC)    Dysrhythmia    Hypertension    Hypothyroidism    Invasive ductal carcinoma of left breast (HCC) 1999   Mitral regurgitation    Myocardial infarction (HCC)    Neutropenia (HCC) 06/09/2016   NSTEMI (non-ST elevated myocardial infarction) (HCC) 04/06/2023   Personal history of radiation therapy    Pulmonary hypertension (HCC)    TIA (transient ischemic attack) 02/10/2022   Past Surgical History:  Procedure Laterality Date   BIOPSY  10/23/2020   Procedure: BIOPSY;  Surgeon: Benancio Deeds, MD;  Location: [facility];  Service: Gastroenterology;;   BREAST BIOPSY     left axillary node dissection   BREAST LUMPECTOMY Left    COLONOSCOPY  03/08/2003   UJW:[id number] rectal polyps destroyed with the tip of the snare/Polyps at hepatic flexure, splenic flexure at 35 cm/Left-sided diverticula: unable to retrieve path   COLONOSCOPY  09/12/2008   AOZ:HYQMVH rectum and distal sigmoid diminutive polyps/scattered left sided diverticulum. hyperplastic   COLONOSCOPY N/A 03/06/2013   QIO:NGEXBMW polyp-removed as described above; colonic diverticulosis. hyperplastic polyps. next TCS 03/2018   ESOPHAGOGASTRODUODENOSCOPY  (EGD) WITH PROPOFOL N/A 10/23/2020   Procedure: ESOPHAGOGASTRODUODENOSCOPY (EGD) WITH PROPOFOL;  Surgeon: Benancio Deeds, MD;  Location: [facility] ENDOSCOPY;  Service: Gastroenterology;  Laterality: N/A;   FOOT SURGERY     LUNG REMOVAL, PARTIAL     upper lobe   Family History  Problem Relation Age of Onset   Cancer Mother    Cancer Sister    Colon cancer Neg Hx    Social History:  reports that she quit smoking about 31 years ago. Her smoking use included cigarettes. She started smoking about 66 years ago. She has a 26.3 pack-year smoking history. She has never used smokeless tobacco. She reports that she does not drink alcohol and does not use drugs. Allergies:  Allergies  Allergen Reactions   Meloxicam Other (See Comments)    Caused an injury to the kidneys, per nephrologist   Micardis Hct [Telmisartan-Hctz] Other (See Comments)    Caused an injury to the kidneys, per nephrologist   Medications Prior to Admission  Medication Sig Dispense Refill   acetaminophen (TYLENOL) 325 MG tablet Take 1.5-3 tablets (487.5-975 mg total) by mouth every 6 (six) hours as needed for mild pain (or Fever >/= 101).     albuterol (VENTOLIN HFA) 108 (90 Base) MCG/ACT inhaler Inhale 1-2 puffs into the lungs every 4 (four) hours as needed for wheezing or shortness of breath.     aspirin EC 81 MG tablet Take 1 tablet (81 mg total) by mouth daily. Swallow whole. 30 tablet 2   Fluticasone-Umeclidin-Vilant (TRELEGY ELLIPTA) 100-62.5-25 MCG/ACT AEPB Inhale  1 puff into the lungs daily. 28 each 0   isosorbide mononitrate (IMDUR) 60 MG 24 hr tablet Take 1 tablet (60 mg total) by mouth daily. 30 tablet 2   levothyroxine (SYNTHROID) 100 MCG tablet Take 1 tablet (100 mcg total) by mouth daily before breakfast. 30 tablet 1   magnesium oxide (MAG-OX) 400 MG tablet Take 0.5 tablets (200 mg total) by mouth at bedtime.     melatonin 3 MG TABS tablet Take 1 tablet (3 mg total) by mouth at bedtime. 60 tablet 0   metoprolol  succinate (TOPROL-XL) 25 MG 24 hr tablet TAKE 1 TABLET DAILY 90 tablet 1   mirtazapine (REMERON) 15 MG tablet Take 1 tablet (15 mg total) by mouth at bedtime. 30 tablet 0   Multiple Vitamin (MULTI-VITAMIN) tablet Take 1 tablet by mouth daily.     pantoprazole (PROTONIX) 40 MG tablet Take 1 tablet (40 mg total) by mouth 2 (two) times daily. 60 tablet 1   pravastatin (PRAVACHOL) 40 MG tablet Take 1 tablet (40 mg total) by mouth every evening. 30 tablet 2   psyllium (HYDROCIL/METAMUCIL) 95 % PACK Take 1 packet by mouth daily. 240 each 0   senna (SENOKOT) 8.6 MG TABS tablet Take 1 tablet (8.6 mg total) by mouth at bedtime. 30 tablet 0    Home: Home Living Family/patient expects to be discharged to:: Private residence Living Arrangements: Children Available Help at Discharge: Family, Available 24 hours/day Type of Home: House Home Access: Stairs to enter [employer] of Steps: Front stairs: 4-5 steps with R HR; Back stairs: 4-5 steps with R HR Entrance Stairs-Rails: Right Home Layout: Two level, Laundry or work area in basement, Able to live on main level with bedroom/bathroom Alternate Level Stairs-Number of Steps: 14- HR on R when descending, L when ascending Bathroom Shower/Tub: Engineer, manufacturing systems: Handicapped height Bathroom Accessibility: Yes Home Equipment: Agricultural consultant (2 wheels), [employer] - quad, BSC/3in1, Grab bars - tub/shower, Other (comment) Additional Comments: recent admission this month reports the pt having a wheelchair, pt denies having one at this time  Functional History: Prior Function Prior Level of Function : Needs assist Mobility Comments: ambulatory with RW or quad cane for short household distances. ADLs Comments: Per pt report, she needed intermittent assist for LB bathing and dressing. Able to toilet, groom, and feed on herself and that she was driving Functional Status:  Mobility: Bed Mobility Overal bed mobility: Needs Assistance Bed  Mobility: Supine to Sit, Sit to Supine Rolling: Mod assist Supine to sit: Contact guard, HOB elevated Sit to supine: Contact guard assist, HOB elevated General bed mobility comments: increaseed time and cues for sequencing Transfers Overall transfer level: Needs assistance Equipment used: Rolling walker (2 wheels) Transfers: Sit to/from Stand Sit to Stand: Min assist Bed to/from chair/wheelchair/BSC transfer type:: Step pivot Step pivot transfers: Contact guard assist General transfer comment: Light assist to rise Ambulation/Gait Ambulation/Gait assistance: Contact guard assist Gait Distance (Feet): 15 Feet (+10) Assistive device: Rolling walker (2 wheels) Gait Pattern/deviations: Step-through pattern, Wide base of support, Trunk flexed, Decreased stride length General Gait Details: Slow gait with cues for upright posture, increased step length, and RW proximity. Assist with line management. One seated rest break due to BLE weakness and fatigue Gait velocity: reduced Gait velocity interpretation: <1.8 ft/sec, indicate of risk for recurrent falls    ADL: ADL Overall ADL's : Needs assistance/impaired Eating/Feeding: Independent Grooming: Wash/dry hands, Wash/dry face, Sitting, Contact guard assist Upper Body Bathing: Supervision/ safety, Cueing for safety,  Cueing for sequencing, Sitting Upper Body Bathing Details (indicate cue type and reason): simulated Lower Body Bathing: Minimal assistance, Moderate assistance, Sit to/from stand Lower Body Bathing Details (indicate cue type and reason): simulated Upper Body Dressing : Contact guard assist, Sitting Lower Body Dressing: Minimal assistance, Sitting/lateral leans Lower Body Dressing Details (indicate cue type and reason): to doff socks at end of session prior to return to supine Toilet Transfer: Cueing for safety, Cueing for sequencing, Stand-pivot, Rolling walker (2 wheels), Minimal assistance Toilet Transfer Details (indicate cue  type and reason): cues for hand placement; light A to rise Toileting- Clothing Manipulation and Hygiene: Moderate assistance, Cueing for safety, Cueing for sequencing, Sit to/from stand Functional mobility during ADLs: Contact guard assist, Cueing for safety, Cueing for sequencing, Rolling walker (2 wheels) General ADL Comments: needing up to 5L O2 during mobility to maintain SpO2 >92  Cognition: Cognition Overall Cognitive Status: History of cognitive impairments - at baseline Orientation Level: Oriented to person Cognition Arousal: Alert Behavior During Therapy: WFL for tasks assessed/performed Overall Cognitive Status: History of cognitive impairments - at baseline General Comments: needing repeated commands inconsistently  Blood pressure (!) 125/57, pulse 75, temperature 98.7 F (37.1 C), temperature source Oral, resp. rate 17, height 5\' 3"  (1.6 m), weight 66.4 kg, SpO2 94%. Physical Exam Gen: no distress, normal appearing HEENT: oral mucosa pink and moist, NCAT Cardio: Reg rate Chest: normal effort, normal rate of breathing Abd: soft, non-distended Ext: no edema Psych: pleasant, normal affect Skin: intact Neuro: Alert and oriented MSK: diffuse weakness  Results for orders placed or performed during the hospital encounter of 07/31/23 (from the past 24 hour(s))  CBC     Status: Abnormal   Collection Time: 08/11/23  3:42 AM  Result Value Ref Range   WBC 5.6 4.0 - 10.5 K/uL   RBC 2.59 (L) 3.87 - 5.11 MIL/uL   Hemoglobin 7.7 (L) 12.0 - 15.0 g/dL   HCT 16.1 (L) 09.6 - 04.5 %   MCV 100.0 80.0 - 100.0 fL   MCH 29.7 26.0 - 34.0 pg   MCHC 29.7 (L) 30.0 - 36.0 g/dL   RDW 40.9 (H) 81.1 - 91.4 %   Platelets 66 (L) 150 - 400 K/uL   nRBC 4.1 (H) 0.0 - 0.2 %  Basic metabolic panel     Status: Abnormal   Collection Time: 08/11/23  3:42 AM  Result Value Ref Range   Sodium 139 135 - 145 mmol/L   Potassium 3.7 3.5 - 5.1 mmol/L   Chloride 88 (L) 98 - 111 mmol/L   CO2 36 (H) 22 - 32  mmol/L   Glucose, Bld 84 70 - 99 mg/dL   BUN 21 8 - 23 mg/dL   Creatinine, Ser 7.82 (H) 0.44 - 1.00 mg/dL   Calcium 9.3 8.9 - 95.6 mg/dL   GFR, Estimated 45 (L) >60 mL/min   Anion gap 15 5 - 15  Glucose, capillary     Status: None   Collection Time: 08/11/23  6:00 AM  Result Value Ref Range   Glucose-Capillary 82 70 - 99 mg/dL   DG Chest 2 View  Result Date: 08/11/2023 CLINICAL DATA:  Pneumonia. EXAM: CHEST - 2 VIEW COMPARISON:  Chest radiographs 08/07/2023, 08/06/2023, 08/04/2023, 07/12/2023; CT chest 08/01/2023 FINDINGS: The cardiac silhouette is again moderately enlarged. Mediastinal contours are within normal limits. Moderate to high-grade atherosclerotic calcifications are again seen within aortic arch. There is mild-to-moderate right and left diffuse interstitial thickening, similar to prior. Small bilateral pleural effusions  appear similar to prior. Right greater than left basilar heterogeneous airspace opacities are similar to prior. No pneumothorax is seen. Mild-to-moderate multilevel degenerative disc changes of the thoracic spine. Left axillary surgical clips. IMPRESSION: 1. Moderate cardiomegaly with mild-to-moderate diffuse interstitial thickening, similar to prior. 2. Small bilateral pleural effusions with right greater than left basilar heterogeneous airspace opacities, similar to prior. Again the basilar opacities may represent atelectasis and/or infection/aspiration. Electronically Signed   By: Neita Garnet M.D.   On: 08/11/2023 10:01    Assessment/Plan: Diagnosis: Debility Does the need for close, 24 hr/day medical supervision in concert with the patient's rehab needs make it unreasonable for this patient to be served in a less intensive setting? Yes Co-Morbidities requiring supervision/potential complications:  1) Diffuse weakness: called grandson to discuss that I think she would be a great CIR candidate but that we do not currently have bed availability, we can send outside  referrals to other inpatient rehab facilities: 2) Fatigue: vitamin D was last checked in 2022, will message attending to see if I may add on this level 3) Screening for B12 deficiency: add on B12 level 4) Hypotension: discussed with attending could be contributing to fatigue and perhaps imdur could be decreased to 15mg  5) COPD: continue O2 Due to bladder management, bowel management, safety, skin/wound care, disease management, medication administration, pain management, and patient education, does the patient require 24 hr/day rehab nursing? Yes Does the patient require coordinated care of a physician, rehab nurse, therapy disciplines of PT, OT to address physical and functional deficits in the context of the above medical diagnosis(es)? Yes Addressing deficits in the following areas: balance, endurance, locomotion, strength, transferring, bowel/bladder control, bathing, dressing, feeding, grooming, toileting, and psychosocial support Can the patient actively participate in an intensive therapy program of at least 3 hrs of therapy per day at least 5 days per week? Yes The potential for patient to make measurable gains while on inpatient rehab is excellent Anticipated functional outcomes upon discharge from inpatient rehab are supervision  with PT, supervision with OT, supervision with SLP. Estimated rehab length of stay to reach the above functional goals is: 10-14 dyas Anticipated discharge destination: Home Overall Rehab/Functional Prognosis: excellent  POST ACUTE RECOMMENDATIONS: This patient's condition is appropriate for continued rehabilitative care in the following setting: Inpatient rehab; [facility] is currently limited in bed availability Patient has agreed to participate in recommended program. Yes Note that insurance prior authorization may be required for reimbursement for recommended care.    I have personally performed a face to face diagnostic evaluation of this patient.  Additionally, I have examined the patient's medical record including any pertinent labs and radiographic images. If the physician assistant has documented in this note, I have reviewed and edited or otherwise concur with the physician assistant's documentation.  Thanks,  Horton Chin, MD 08/11/2023

## 2023-08-11 NOTE — Care Management Important Message (Signed)
Important Message  Patient Details  Name: Lisa [name] MRN: [id number] Date of Birth: 03/07/1934   Important Message Given:  Yes - Medicare IM     Sherilyn Banker 08/11/2023, 4:25 PM

## 2023-08-12 DIAGNOSIS — E871 Hypo-osmolality and hyponatremia: Secondary | ICD-10-CM | POA: Diagnosis not present

## 2023-08-12 DIAGNOSIS — Z515 Encounter for palliative care: Secondary | ICD-10-CM | POA: Diagnosis not present

## 2023-08-12 LAB — CULTURE, BLOOD (ROUTINE X 2)
Culture: NO GROWTH
Culture: NO GROWTH
Special Requests: ADEQUATE

## 2023-08-12 LAB — BASIC METABOLIC PANEL
Anion gap: 10 (ref 5–15)
BUN: 16 mg/dL (ref 8–23)
CO2: 36 mmol/L — ABNORMAL HIGH (ref 22–32)
Calcium: 9.1 mg/dL (ref 8.9–10.3)
Chloride: 89 mmol/L — ABNORMAL LOW (ref 98–111)
Creatinine, Ser: 1.03 mg/dL — ABNORMAL HIGH (ref 0.44–1.00)
GFR, Estimated: 53 mL/min — ABNORMAL LOW (ref >60)
Glucose, Bld: 99 mg/dL (ref 70–99)
Potassium: 3.5 mmol/L (ref 3.5–5.1)
Sodium: 135 mmol/L (ref 135–145)

## 2023-08-12 LAB — GLUCOSE, CAPILLARY: Glucose-Capillary: 82 mg/dL (ref 70–99)

## 2023-08-12 NOTE — Progress Notes (Signed)
Occupational Therapy Treatment Patient Details Name: Lisa [name] MRN: [id number] DOB: [birth year] Today's Date: 08/12/2023   History of present illness 89 y.o. female presents to [facility] hospital on 07/31/2023 with SOB, weakness and poor PO intake. Pt found to be hyponatremic. Pt received 2 units PRBC on 10/1 AM. Pt has recently been hospitalized on November 05, 2022 and then April 07, 2023 at which time pt attended AIR improving from Mod A to Min A for functional mobility and returning home. Pt was then re-hospitalized on 07/14/23 and discharged home at [facility] ambulating 60 ft with RW. Pt was then re-hospitalized for this admission on 08/02/23. PMH includes chronic HFpEF, CKD III, PAH, lung adenocarcinoma, CML, moderate AS, AAA, NSTEMI, TIA, PAF.   OT comments  Pt was present in bed but agreeable to complete Occupational Therapy at this time. Pt was able to complete transfers with CGA to min assist but needed min to mod cues for safety. Mrs. Gryder was able to complete UB bathing/dressing post set up and cues on sequencing/safety and LB with CGA to min assist. However, it is noted when attempting to complete any standing ADLS was only able to remain standing for about 1-2 mins as started have SOB. She was able to remain on 4L of o2 via [place] with lowest reading at 87% but was able to recover into 90s% when cued on breathing techniques. Patient will benefit from continued inpatient follow up therapy, <3 hours/day      If plan is discharge home, recommend the following:  A little help with bathing/dressing/bathroom;A little help with walking and/or transfers;Assistance with cooking/housework;Assist for transportation   Equipment Recommendations   (TBD at enxt level of care)    Recommendations for Other Services      Precautions / Restrictions Precautions Precautions: Fall;Other (comment) Precaution Comments: monitor SpO2 Restrictions Weight Bearing Restrictions: No       Mobility Bed Mobility Overal  bed mobility: Needs Assistance Bed Mobility: Supine to Sit Rolling: Modified independent (Device/Increase time)   Supine to sit: Contact guard, HOB elevated, Used rails     General bed mobility comments: need cues on hand placement    Transfers Overall transfer level: Needs assistance Equipment used: Rolling walker (2 wheels) Transfers: Sit to/from Stand Sit to Stand: Min assist, Contact guard assist     Step pivot transfers: Contact guard assist     General transfer comment: Pt vareid on level of assistance but needed min to mod cues on how to place hand with positioning to compelte transfer.     Balance Overall balance assessment: Needs assistance Sitting-balance support: No upper extremity supported, Feet supported Sitting balance-Leahy Scale: Good     Standing balance support: Single extremity supported, Bilateral upper extremity supported Standing balance-Leahy Scale: Fair Standing balance comment: was able to complete peri care in standing post set up                           ADL either performed or assessed with clinical judgement   ADL Overall ADL's : Needs assistance/impaired Eating/Feeding: Set up;Minimal assistance Eating/Feeding Details (indicate cue type and reason): Pt needs opening container lids Grooming: Wash/dry hands;Wash/dry face;Set up;Sitting   Upper Body Bathing: Set up;Sitting   Lower Body Bathing: Contact guard assist;Minimal assistance;Sit to/from stand Lower Body Bathing Details (indicate cue type and reason): completed sponge bath post set up of materials sitting to standing Upper Body Dressing : Set up;Sitting   Lower Body Dressing: Contact  guard assist;Minimal assistance;Sit to/from stand Lower Body Dressing Details (indicate cue type and reason): Pt was long istting in bed and was able to complet LB dressing but needed cues to complete but when sitting in chair needs increase in assist Toilet Transfer: Contact guard  assist;Minimal assistance;Cueing for safety;Cueing for sequencing;Rolling walker (2 wheels)   Toileting- Clothing Manipulation and Hygiene: Minimal assistance;Sit to/from stand       Functional mobility during ADLs: Contact guard assist;Minimal assistance;Cueing for safety;Cueing for sequencing;Rolling walker (2 wheels) General ADL Comments: Pt on 4L in session but only able to stand for about 1-2 mins before requiring to rest with ADLS in standing. Pt was able to take couple steps to chair but required CGA-min assist due to line management. Pt throoughout the session aske about needing to rinate and had to be reminded about cath in place.    Extremity/Trunk Assessment Upper Extremity Assessment Upper Extremity Assessment: Generalized weakness   Lower Extremity Assessment Lower Extremity Assessment: Defer to PT evaluation        Vision       Perception     Praxis      Cognition Arousal: Alert Behavior During Therapy: WFL for tasks assessed/performed Overall Cognitive Status: History of cognitive impairments - at baseline                                 General Comments: needing repeated commands inconsistently        Exercises      Shoulder Instructions       General Comments Pt was on 4L continues via [place] but noted with any stadning activity started to destat but was able to go into a seated position and recover with cues on how to complete deep daipharmic breathing    Pertinent Vitals/ Pain       Pain Assessment Pain Assessment: No/denies pain  Home Living                                          Prior Functioning/Environment              Frequency  Min 1X/week        Progress Toward Goals  OT Goals(current goals can now be found in the care plan section)  Progress towards OT goals: Progressing toward goals  Acute Rehab OT Goals Patient Stated Goal: to work OT Goal Formulation: With patient Time For Goal  Achievement: 08/16/23 Potential to Achieve Goals: Good ADL Goals Pt Will Perform Grooming: with supervision;with set-up;standing Pt Will Perform Upper Body Bathing: with set-up Pt Will Perform Lower Body Bathing: with contact guard assist;with supervision Pt Will Perform Upper Body Dressing: with set-up Pt Will Perform Lower Body Dressing: with contact guard assist;with supervision Pt Will Transfer to Toilet: with supervision;with modified independence;ambulating Pt Will Perform Toileting - Clothing Manipulation and hygiene: with contact guard assist;with supervision;sit to/from stand  Plan      Co-evaluation                 AM-PAC OT "6 Clicks" Daily Activity     Outcome Measure   Help from another person eating meals?: None Help from another person taking care of personal grooming?: A Little Help from another person toileting, which includes using toliet, bedpan, or urinal?: A Little Help from another person bathing (including  washing, rinsing, drying)?: A Lot Help from another person to put on and taking off regular upper body clothing?: A Little Help from another person to put on and taking off regular lower body clothing?: A Little 6 Click Score: 18    End of Session Equipment Utilized During Treatment: Rolling walker (2 wheels);Gait belt  OT Visit Diagnosis: Unsteadiness on feet (R26.81);Other abnormalities of gait and mobility (R26.89);History of falling (Z91.81);Muscle weakness (generalized) (M62.81)   Activity Tolerance Patient limited by fatigue   Patient Left in chair;with call bell/phone within reach;with chair alarm set   Nurse Communication Mobility status        Time: 7829-5621 OT Time Calculation (min): 40 min  Charges: OT General Charges $OT Visit: 1 Visit OT Treatments $Self Care/Home Management : 38-52 mins  Presley Raddle OTR/L  Acute Rehab Services  [phone] office number   Alphia Moh 08/12/2023, 12:13 PM

## 2023-08-12 NOTE — Progress Notes (Signed)
Daily Progress Note   Patient Name: Lisa [name]       Date: 08/12/2023 DOB: October 08, 1934  Age: 89 y.o. MRN#: [id number] Attending Physician: Rhetta Mura, MD Primary Care Physician: [name], MD Admit Date: 07/31/2023  Reason for Consultation/Follow-up: Establishing goals of care  Patient Profile/HPI: Lisa [name] is a 89 y.o. 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 intake for the past few days. Admitted and treated for hyponatremia and pnuemonia which has improved.   Subjective: Chart reviewed including labs, progress notes, imaging from this and previous encounters.  Patient awake and alert.  She is eager to participate with her therapist.  No family at bedside.   Review of Systems  Respiratory:  Negative for shortness of breath.      Physical Exam Vitals and nursing note reviewed.  Constitutional:      General: She is not in acute distress.    Appearance: She is not ill-appearing.  Neurological:     Mental Status: She is alert.  Psychiatric:     Comments: pleasant             Vital Signs: BP (!) 109/47 (BP Location: Right Arm)   Pulse 73   Temp 97.9 F (36.6 C) (Oral)   Resp 20   Ht 5\' 3"  (1.6 m)   Wt 68.7 kg   SpO2 96%   BMI 26.83 kg/m  SpO2: SpO2: 96 % O2 Device: O2 Device: Nasal Cannula O2 Flow Rate: O2 Flow Rate (L/min): 4 L/min  Intake/output summary:   Intake/Output Summary (Last 24 hours) at 08/12/2023 1232 Last data filed at 08/12/2023 0800 Gross per 24 hour  Intake 487.85 ml  Output 350 ml  Net 137.85 ml   LBM: Last BM Date : 08/12/23 Baseline Weight: Weight: 65.9 kg Most recent weight: Weight: 68.7 kg       Palliative Assessment/Data: PPS: 40%      Patient Active Problem List   Diagnosis Date Noted   Lung nodule seen on imaging study 08/01/2023  Nausea and vomiting 08/01/2023   Failure to thrive in adult 07/28/2023   Primary generalized (osteo)arthritis 07/28/2023   CMML (chronic myelomonocytic leukemia) (HCC) 07/06/2023   CAD (coronary artery disease) 05/20/2023   History of non-ST elevation myocardial infarction (NSTEMI) 05/20/2023   Elevated brain natriuretic peptide (BNP) level 05/20/2023   Nonrheumatic aortic valve stenosis 11/20/2022   Acute on chronic respiratory failure with hypoxia (HCC) 11/03/2022   Pancytopenia (HCC) 11/03/2022   Hyperkalemia 11/03/2022   Chronic diastolic CHF (congestive heart failure) (HCC) - LVEF 60-65% by echo 07-2023 02/11/2022   Pulmonary emphysema (HCC) 09/24/2021   Myelodysplastic syndrome (HCC) 05/14/2021   Hyponatremia    Debility 10/30/2020   Anemia of chronic disease    Paroxysmal atrial fibrillation (HCC)    Malnutrition of moderate degree 10/28/2020   Decreased appetite    Weakness    AKI (acute kidney injury) (HCC) 10/21/2020   Gout 10/21/2020   Hypothyroidism 10/21/2020   HTN (hypertension) 10/21/2020   Weight loss 10/21/2020   Generalized muscle weakness 10/21/2020   Osteoarthritis 10/21/2020   Deficiency anemia 03/13/2019   Chronic respiratory failure with hypoxia (HCC) 05/31/2017   CKD stage 3a, GFR 45-59 ml/min (HCC) 05/31/2017   Normocytic anemia 05/31/2017   Abdominal aortic aneurysm 05/31/2017   Urinary incontinence 05/31/2017   Vitamin D deficiency 06/10/2016   Invasive ductal carcinoma of left breast (HCC) 06/09/2016   Adenocarcinoma of left lung  (HCC) 06/09/2016   Malignant neoplasm of unspecified site of unspecified female breast (HCC) 06/09/2016   Adenomatous polyps 02/08/2013    Palliative Care Assessment & Plan    Assessment/Recommendations/Plan  Continue current care Family is hopeful for CIR placement- patient very eager to participate in therapies   Code Status: Full code  Prognosis:  Unable to determine  Discharge Planning: To Be Determined   Thank you for allowing the Palliative Medicine Team to assist in the care of this patient.  Total time: 35 mins    Preparing to see the patient (e.g., review of tests) Obtaining and/or reviewing separately obtained history Performing a medically necessary appropriate examination and/or evaluation Counseling and educating the patient/family/caregiver Ordering medications, tests, or procedures Referring and communicating with other health care professionals (when not reported separately) Documenting clinical information in the electronic or other health record Independently interpreting results (not reported separately) and communicating results to the patient/family/caregiver Care coordination (not reported separately) Clinical documentation  Ocie Bob, AGNP-C Palliative Medicine   Please contact Palliative Medicine Team phone at [phone] for questions and concerns.

## 2023-08-12 NOTE — Progress Notes (Signed)
Mobility Specialist Progress Note:   08/12/23 1326  Mobility  Activity Transferred from chair to bed  Level of Assistance Minimal assist, patient does 75% or more  Assistive Device Front wheel walker  Distance Ambulated (ft) 3 ft  Activity Response Tolerated well  Mobility Referral Yes  $Mobility charge 1 Mobility  Mobility Specialist Start Time (ACUTE ONLY) 1305  Mobility Specialist Stop Time (ACUTE ONLY) 1315  Mobility Specialist Time Calculation (min) (ACUTE ONLY) 10 min    Pt received in chair, requesting to go back to bed. MinA to stand and pivot from chair. Pt denied any discomfort during transition, asymptomatic throughout. Pt left in bed with call bell in reach and all needs met. Family present.   Leory Plowman  Mobility Specialist Please contact via [employer] office at [phone]

## 2023-08-12 NOTE — Progress Notes (Signed)
Nutrition Follow-up  DOCUMENTATION CODES:   Non-severe (moderate) malnutrition in context of chronic illness  INTERVENTION:  Continue diet recommendations per SLP Nepro Shake po BID, each supplement provides 425 kcal and 19 grams protein Magic cup TID with meals, each supplement provides 290 kcal and 9 grams of protein  NUTRITION DIAGNOSIS:   Moderate Malnutrition related to chronic illness as evidenced by moderate fat depletion, moderate muscle depletion, energy intake < or equal to 50% for > or equal to 1 month. - remains applicable  GOAL:   Patient will meet greater than or equal to 90% of their needs - progressing  MONITOR:   PO intake, Supplement acceptance, Labs, Weight trends  REASON FOR ASSESSMENT:   Consult Assessment of nutrition requirement/status  ASSESSMENT:   89 y.o. female Admitted with hyponatremia. Past medical history of HFpEF, CKD, pulmonary hypertension, aortic stenosis, CAD, atrial fibrillation, CML, COPD, Breast and lung cancer. Pt daughter reports more agitated at night,  Reported poor oral intake with N/V x 1 day.  10/7 SLP evaluation- recommend dysphagia 3, thin liquid  TOC working on discharge plan.   Spoke with pt at bedside. Pt getting ready to work with OT. She reports that her appetite is improving. She is trying to eat with encouragement from family. She recalls eating what she likes from the meal trays. Pt enjoys nepro shakes. [name] reflects all but 1 scheduled supplement provided. Pt reports consuming at least 1 per day.   Meal completions: 10/7: 80% breakfast, 60% lunch, 45% dinner 10/8: 25% breakfast 10/9: 80% breakfast, 100% lunch  Admission weight history: 09/29: 65.9 kg 10/10: 68.7 kg Weights throughout admission have varied likely d/t volume changes via fluids + lasix.   Medications: lasix 20mg  BID, melatonin, MVI, psyllium, senna  Labs: Cr 1.18, GFR 45, CBG 82   Diet Order:   Diet Order             DIET DYS 3 Room service  appropriate? No; Fluid consistency: Thin  Diet effective now                   EDUCATION NEEDS:   No education needs have been identified at this time  Skin:  Skin Assessment: Skin Integrity Issues: Skin Integrity Issues:: Stage II Stage II: mid sacrum  Last BM:  10/9 (type 6 large)  Height:   Ht Readings from Last 1 Encounters:  08/02/23 5\' 3"  (1.6 m)    Weight:   Wt Readings from Last 1 Encounters:  08/12/23 68.7 kg    Ideal Body Weight:  52 kg  BMI:  Body mass index is 26.83 kg/m.  Estimated Nutritional Needs:   Kcal:  1500-1700  Protein:  85-100g  Fluid:  >/=1.5L  Drusilla Kanner, RDN, LDN Clinical Nutrition

## 2023-08-13 DIAGNOSIS — E871 Hypo-osmolality and hyponatremia: Secondary | ICD-10-CM | POA: Diagnosis not present

## 2023-08-13 LAB — CBC WITH DIFFERENTIAL/PLATELET
Abs Immature Granulocytes: 0.16 10*3/uL — ABNORMAL HIGH (ref 0.00–0.07)
Basophils Absolute: 0 10*3/uL (ref 0.0–0.1)
Basophils Relative: 0 %
Eosinophils Absolute: 0 10*3/uL (ref 0.0–0.5)
Eosinophils Relative: 0 %
HCT: 27.8 % — ABNORMAL LOW (ref 36.0–46.0)
Hemoglobin: 8 g/dL — ABNORMAL LOW (ref 12.0–15.0)
Immature Granulocytes: 4 %
Lymphocytes Relative: 38 %
Lymphs Abs: 1.5 10*3/uL (ref 0.7–4.0)
MCH: 30.1 pg (ref 26.0–34.0)
MCHC: 28.8 g/dL — ABNORMAL LOW (ref 30.0–36.0)
MCV: 104.5 fL — ABNORMAL HIGH (ref 80.0–100.0)
Monocytes Absolute: 1.7 10*3/uL — ABNORMAL HIGH (ref 0.1–1.0)
Monocytes Relative: 44 %
Neutro Abs: 0.5 10*3/uL — ABNORMAL LOW (ref 1.7–7.7)
Neutrophils Relative %: 14 %
Platelets: 65 10*3/uL — ABNORMAL LOW (ref 150–400)
RBC: 2.66 MIL/uL — ABNORMAL LOW (ref 3.87–5.11)
RDW: 18.8 % — ABNORMAL HIGH (ref 11.5–15.5)
WBC: 3.9 10*3/uL — ABNORMAL LOW (ref 4.0–10.5)
nRBC: 5.6 % — ABNORMAL HIGH (ref 0.0–0.2)

## 2023-08-13 LAB — COMPREHENSIVE METABOLIC PANEL
ALT: 13 U/L (ref 0–44)
AST: 30 U/L (ref 15–41)
Albumin: 2.4 g/dL — ABNORMAL LOW (ref 3.5–5.0)
Alkaline Phosphatase: 39 U/L (ref 38–126)
Anion gap: 12 (ref 5–15)
BUN: 19 mg/dL (ref 8–23)
CO2: 36 mmol/L — ABNORMAL HIGH (ref 22–32)
Calcium: 9 mg/dL (ref 8.9–10.3)
Chloride: 88 mmol/L — ABNORMAL LOW (ref 98–111)
Creatinine, Ser: 1.12 mg/dL — ABNORMAL HIGH (ref 0.44–1.00)
GFR, Estimated: 48 mL/min — ABNORMAL LOW (ref >60)
Glucose, Bld: 80 mg/dL (ref 70–99)
Potassium: 3.6 mmol/L (ref 3.5–5.1)
Sodium: 136 mmol/L (ref 135–145)
Total Bilirubin: 0.9 mg/dL (ref 0.3–1.2)
Total Protein: 7.5 g/dL (ref 6.5–8.1)

## 2023-08-13 LAB — GLUCOSE, CAPILLARY: Glucose-Capillary: 83 mg/dL (ref 70–99)

## 2023-08-13 MED ORDER — FUROSEMIDE 20 MG PO TABS: 20.0000 mg | ORAL_TABLET | Freq: Every day | ORAL | Status: DC

## 2023-08-13 MED ORDER — FUROSEMIDE 20 MG PO TABS
20.0000 mg | ORAL_TABLET | Freq: Every day | ORAL | Status: DC
  Administered 2023-08-13 – 2023-08-16 (×4): 20 mg via ORAL
  Filled 2023-08-13 (×4): qty 1

## 2023-08-13 NOTE — Progress Notes (Signed)
Late entry  HOSPITALIST ROUNDING NOTE Lisa [name] [id number]:[id number]  DOB: 06-Feb-1934  DOA: 07/31/2023  PCP: [name], MD  08/13/2023,8:23 AM   LOS: 12 days      Code Status: Full From: Home  current Dispo: Eventual skilled     89 year old black female known COPD complicated by chronic respiratory failure on 2 L of oxygen CKD 3B Paroxysmal A-fib not on anticoagulation secondary to prior GI bleed Moderate AS mild MR with severe pulmonary hypertension (group 2) Myelodysplastic syndrome with CML nature follows with [facility] oncology Dr. [name]  LUL lobectomy and breast cancer 4.8 cm AAA not a candidate for repair Prior endoscopy 10/23/2020 hiatal hernia normal esophagus Rx H. pylori at the time-on evaluation 04/2023 for reported dark stool it was decided to hold off on evaluation of colon because of high risk and NSTEMI  Recently discharged after 9/9-9/12 hospitalization with acute on chronic respiratory failure and NSTEMI with bilateral effusions was treated with Lasix and heparin gtt. at the time  Re-presented to the emergency room shortness of breath not eating drinking progressively more weak Sodium 118, potassium 5.5, Lokelma started, treated with IV fluid tolvaptan given 9/29 nephrology consulted 10/1 transfused 2 units 10/2 more lethargic CCM consulted 10/6 Neupogen started after discussion with oncologist Dr. [name]   Plan  Multifactorial hyponatremia-prior SIADH, had hypovolemic hyponatrmia on admission Cortisol normal --aldosterone renin ratio 2.3 has now--resolved  Concern for aspiration CXR 10/2 Waxing waning symptoms with hypoxia increasing lethargy on 10/2 as well as on 10/4 Last CXR 10/9 showed moderate cardiomegaly with mild interstitial thickening and small effusions bilaterally completed antibiotics 10/8 LP last saw on 10/7 recommended dysphagia 3  Acute hypoxic respiratory failure on arrival secondary to diastolic heart failure + pneumonia Previous LVEF 718  2460-65%, here 9/9 echo shows grade 2 diastolic dysfunction Nephrology engaged and recommending Lasix--currently on lasix 20 daily po--change to once daily lasix 20 and review labs as op Grandson relates patient was on intermittent doses of Lasix and will need to be on at least a regular daily dose or transition to at least 3-4 times a week Lasix to prevent volume overload  Metabolic encephalopathy Improved with treatment of pneumonia--much improved overall--coherent  Myelodysplastic syndrome/prior lung cancer with lobectomy, prior breast cancer--underlying pancytopenia as well Stage I breast cancer in 1999 with lumpectomy received tamoxifen for 5 years-last mammogram etc. as per oncologist Stage I adenocarcinoma LUL lobectomy 2006 high risk for dysplastic MDS CMML tlast platelets in the outpatient setting were 57- D/c neutropenic prec Platelet counts are stable in the upper 50-70 range-SCDs  4.8 cm AAA not candidate for repair Advanced age etc. precludes  Paroxysmal A-fib not a candidate for anticoagulation?  GI bleed in the past Group 2 Pulmonary hypertension moderate AS mild MR Continues on metoprolol XL 25 daily Imdur 30 ? Candidate for DOAC 2/2 tcp? Continues on aspirin 81 daily  Chronic respiratory failure on 2 L of oxygen Respiratory failure secondary to diastolic heart failure-uses 3 L of oxygen at home-for the pasrtf 1-2 yr  CKD 3A Hyperkalemic on admission -improved with vvolume veltessa--no further rx  Multifactorial anemia--macrocytic kinetics--iron 67 saturation ratio is 24 As above--gets ESA at oncology office--- folic acid started here--- given elevated folate levels likely can hold  Discussed with both her son At the bedside : Discussed with her grandson psychiatrist Dr. Delories Heinz who stays in [place] Plan is for CIR and then return home with son and daughter   DVT prophylaxis:   SCD  Status is:  Inpatient Remains inpatient appropriate because:   Still  somewhat volume overloaded needs gentle diuresis    Subjective:  Looks much better overall no distress sitting up in chair son at the bedside Asking when she is going to get rehab Mild cough no fever no chills on oxygen  Objective + exam Vitals:   08/12/23 2304 08/13/23 0347 08/13/23 0741 08/13/23 0800  BP:  (!) 142/64  (!) 122/49  Pulse:  71  64  Resp: 20 20  18   Temp: 97.9 F (36.6 C) 98.3 F (36.8 C)  97.8 F (36.6 C)  TempSrc: Axillary Oral  Oral  SpO2:  93% 95% (!) 85%  Weight:      Height:       Filed Weights   08/10/23 0603 08/11/23 0439 08/12/23 0500  Weight: 67.8 kg 66.4 kg 68.7 kg    Examination:  You may NCAT EXTR Holosystolic murmur Decreased air entry posteriorly but no wheeze rales rhonchi Abdomen soft No lower extremity edema  Data Reviewed: reviewed   CBC    Component Value Date/Time   WBC 3.9 (L) 08/13/2023 0544   RBC 2.66 (L) 08/13/2023 0544   HGB 8.0 (L) 08/13/2023 0544   HCT 27.8 (L) 08/13/2023 0544   HCT 30.0 (L) 05/14/2021 1446   PLT 65 (L) 08/13/2023 0544   MCV 104.5 (H) 08/13/2023 0544   MCH 30.1 08/13/2023 0544   MCHC 28.8 (L) 08/13/2023 0544   RDW 18.8 (H) 08/13/2023 0544   LYMPHSABS 1.5 08/13/2023 0544   MONOABS 1.7 (H) 08/13/2023 0544   EOSABS 0.0 08/13/2023 0544   BASOSABS 0.0 08/13/2023 0544      Latest Ref Rng & Units 08/13/2023    5:44 AM 08/12/2023    9:57 AM 08/11/2023    3:42 AM  CMP  Glucose 70 - 99 mg/dL 80  99  84   BUN 8 - 23 mg/dL 19  16  21    Creatinine 0.44 - 1.00 mg/dL 4.09  8.11  9.14   Sodium 135 - 145 mmol/L 136  135  139   Potassium 3.5 - 5.1 mmol/L 3.6  3.5  3.7   Chloride 98 - 111 mmol/L 88  89  88   CO2 22 - 32 mmol/L 36  36  36   Calcium 8.9 - 10.3 mg/dL 9.0  9.1  9.3   Total Protein 6.5 - 8.1 g/dL 7.5     Total Bilirubin 0.3 - 1.2 mg/dL 0.9     Alkaline Phos 38 - 126 U/L 39     AST 15 - 41 U/L 30     ALT 0 - 44 U/L 13        Scheduled Meds:  aspirin EC  81 mg Oral Daily   Chlorhexidine  Gluconate Cloth  6 each Topical Daily   feeding supplement (NEPRO CARB STEADY)  237 mL Oral BID BM   fluticasone furoate-vilanterol  1 puff Inhalation Daily   And   umeclidinium bromide  1 puff Inhalation Daily   furosemide  20 mg Oral BID   guaiFENesin  600 mg Oral BID   isosorbide mononitrate  15 mg Oral Daily   levothyroxine  100 mcg Oral Q0600   melatonin  3 mg Oral QHS   metoprolol succinate  25 mg Oral Daily   multivitamin with minerals  1 tablet Oral Daily   mouth rinse  15 mL Mouth Rinse 4 times per day   pravastatin  40 mg Oral QPM   psyllium  1 packet Oral Daily   senna  1 tablet Oral QHS   Continuous Infusions:    Time  27  Rhetta Mura, MD  [facility]

## 2023-08-13 NOTE — TOC Progression Note (Signed)
Transition of Care (TOC) - Progression Note  Donn Pierini RN, BSN Transitions of Care Unit 4E- RN Case Manager See Treatment Team for direct phone #   Patient Details  Name: Lisa [name] MRN: [id number] Date of Birth: 1934-04-26  Transition of Care [facility] CM/SW Contact  Zenda Alpers, Lenn Sink, RN Phone Number: 08/13/2023, 11:17 AM  Clinical Narrative:    CM received confirmation from [facility] that they have spoken with grandson and he has accepted bed offer for AIR admit to [facility].  They will not have a bed until Monday for admit to [facility]- however pt is on list should something come available sooner they will get her transitioned over.  [facility] has TOC weekend staff to contact should something come open.   At this time anticipate transition to [facility] on 10/14- Monday.      Barriers to Discharge: Continued Medical Work up  Expected Discharge Plan and Services In-house Referral: Clinical Social Work     Living arrangements for the past 2 months: Single Family Home                                       Social Determinants of Health (SDOH) Interventions SDOH Screenings   Food Insecurity: No Food Insecurity (08/03/2023)  Housing: Low Risk  (08/03/2023)  Transportation Needs: No Transportation Needs (08/03/2023)  Utilities: Not At Risk (08/03/2023)  Depression (PHQ2-9): Low Risk  (12/12/2020)  Physical Activity: Inactive (12/12/2020)  Tobacco Use: Medium Risk (08/01/2023)    Readmission Risk Interventions    08/03/2023   11:15 AM 07/13/2023   11:15 AM 05/24/2023    2:10 PM  Readmission Risk Prevention Plan  Transportation Screening Complete Complete Complete  HRI or Home Care Consult   Complete  Social Work Consult for Recovery Care Planning/Counseling   Complete  Palliative Care Screening   Complete  Medication Review Oceanographer) Referral to Pharmacy Complete Complete  PCP or Specialist appointment within 3-5 days of discharge Complete    HRI or Home Care  Consult Complete Complete   SW Recovery Care/Counseling Consult Complete Complete   Palliative Care Screening Not Applicable Not Applicable   Skilled Nursing Facility Not Applicable Not Applicable

## 2023-08-13 NOTE — Progress Notes (Signed)
Physical Therapy Treatment Patient Details Name: Lisa [name] MRN: [id number] DOB: 1934/04/08 Today's Date: 08/13/2023   History of Present Illness 89 y.o. female presents to [facility] hospital on 07/31/2023 with SOB, weakness and poor PO intake. Pt found to be hyponatremic. Pt received 2 units PRBC on 10/1 AM. Pt has recently been hospitalized on November 05, 2022 and then April 07, 2023 at which time pt attended AIR improving from Mod A to Min A for functional mobility and returning home. Pt was then re-hospitalized on 07/14/23 and discharged home at [facility] A ambulating 60 ft with RW. Pt was then re-hospitalized for this admission on 08/02/23. PMH includes chronic HFpEF, CKD III, PAH, lung adenocarcinoma, CML, moderate AS, AAA, NSTEMI, TIA, PAF.    PT Comments  Pt received in supine and agreeable to session. Pt noted to have bowel incontinence upon standing and requires assist for pericare. Pt able to stand for pericare, however pt reporting fatigue and BLE weakness requiring frequent seated rest breaks. Pt able to tolerate short gait distance around the bed to the recliner, but is unable to progress distance due to increased fatigue from standing trials. Pt continues to benefit from PT services to progress toward functional mobility goals.    If plan is discharge home, recommend the following: A little help with walking and/or transfers;A little help with bathing/dressing/bathroom;Assistance with cooking/housework;Direct supervision/assist for medications management;Direct supervision/assist for financial management;Help with stairs or ramp for entrance;Assist for transportation   Can travel by private vehicle     Yes  Equipment Recommendations  None recommended by PT    Recommendations for Other Services       Precautions / Restrictions Precautions Precautions: Fall;Other (comment) Precaution Comments: monitor SpO2 Restrictions Weight Bearing Restrictions: No     Mobility  Bed Mobility Overal  bed mobility: Needs Assistance Bed Mobility: Supine to Sit     Supine to sit: Supervision, Used rails, HOB elevated     General bed mobility comments: increased time    Transfers Overall transfer level: Needs assistance Equipment used: Rolling walker (2 wheels) Transfers: Sit to/from Stand Sit to Stand: Min assist           General transfer comment: From EOB x4 with min A for power up. Pt demonstrating good hand placement without cues.    Ambulation/Gait Ambulation/Gait assistance: Contact guard assist Gait Distance (Feet): 10 Feet Assistive device: Rolling walker (2 wheels) Gait Pattern/deviations: Step-through pattern, Trunk flexed, Decreased stride length Gait velocity: reduced     General Gait Details: Slow gait with heavy reliance on RW support. CGA for safety, but no buckling or LOB      Balance Overall balance assessment: Needs assistance Sitting-balance support: No upper extremity supported, Feet supported Sitting balance-Leahy Scale: Good Sitting balance - Comments: sitting EOB   Standing balance support: Bilateral upper extremity supported, Reliant on assistive device for balance Standing balance-Leahy Scale: Fair Standing balance comment: with RW support                            Cognition Arousal: Alert Behavior During Therapy: WFL for tasks assessed/performed Overall Cognitive Status: History of cognitive impairments - at baseline                                          Exercises      General Comments General comments (skin  integrity, edema, etc.): Pt on 4L throughout session with O2 sats [facility]      Pertinent Vitals/Pain Pain Assessment Pain Assessment: Faces Faces Pain Scale: Hurts a little bit Pain Location: stomach Pain Descriptors / Indicators: Discomfort Pain Intervention(s): Monitored during session     PT Goals (current goals can now be found in the care plan section) Acute Rehab PT Goals Patient  Stated Goal: not stated PT Goal Formulation: With patient Time For Goal Achievement: 08/16/23 Progress towards PT goals: Progressing toward goals    Frequency    Min 1X/week       AM-PAC PT "6 Clicks" Mobility   Outcome Measure  Help needed turning from your back to your side while in a flat bed without using bedrails?: A Little Help needed moving from lying on your back to sitting on the side of a flat bed without using bedrails?: A Little Help needed moving to and from a bed to a chair (including a wheelchair)?: A Little Help needed standing up from a chair using your arms (e.g., wheelchair or bedside chair)?: A Little Help needed to walk in hospital room?: A Little Help needed climbing 3-5 steps with a railing? : Total 6 Click Score: 16    End of Session Equipment Utilized During Treatment: Gait belt;Oxygen Activity Tolerance: Patient limited by fatigue;Patient tolerated treatment well Patient left: with call bell/phone within reach;in chair Nurse Communication: Mobility status PT Visit Diagnosis: Other abnormalities of gait and mobility (R26.89);Muscle weakness (generalized) (M62.81)     Time: 1610-9604 PT Time Calculation (min) (ACUTE ONLY): 31 min  Charges:    $Gait Training: 8-22 mins $Therapeutic Activity: 8-22 mins PT General Charges $$ ACUTE PT VISIT: 1 Visit                     Johny Shock, PTA Acute Rehabilitation Services Secure Chat Preferred  [phone]    Johny Shock 08/13/2023, 10:32 AM

## 2023-08-13 NOTE — Progress Notes (Signed)
Mobility Specialist Progress Note:   08/13/23 1216  Mobility  Activity Transferred to/from [facility];Transferred from chair to bed  Level of Assistance Minimal assist, patient does 75% or more  Assistive Device Front wheel walker  Distance Ambulated (ft) 6 ft  Activity Response Tolerated well  Mobility Referral Yes  $Mobility charge 1 Mobility  Mobility Specialist Start Time (ACUTE ONLY) 1125  Mobility Specialist Stop Time (ACUTE ONLY) 1145  Mobility Specialist Time Calculation (min) (ACUTE ONLY) 20 min   Pre Mobility: 76 HR , 94% SpO2 4 L  During Mobility: 77 HR , 92% SpO2 4 L Post Mobility: 79 HR , 94% SpO2 4 L  Pt received in chair, requesting to use BR. MinA to stand and pivot to [facility]. Void successful. Assisted with pericare. Pt denied any discomfort during transition, asymptomatic throughout. Pt returned to bed with call bell in reach and all needs met. RN notified.   Leory Plowman  Mobility Specialist Please contact via [employer] office at [phone]

## 2023-08-13 NOTE — TOC Progression Note (Signed)
Transition of Care (TOC) - Progression Note  Donn Pierini RN, BSN Transitions of Care Unit 4E- RN Case Manager See Treatment Team for direct phone #   Patient Details  Name: Lisa [name] MRN: [id number] Date of Birth: 1933-12-12  Transition of Care [facility]) CM/SW Contact  Zenda Alpers, Lenn Sink, RN Phone Number: 08/13/2023, 11:12 AM  Clinical Narrative:    Received call from grandson/POA- Dr. Richardine Service regarding AIR choice- per Dr. Richardine Service he would like to look at [facility] as first choice since [facility] does not have any beds available. His next choices are as follows after [facility]: 2-[facility], 3- [facility], 4- [facility]   CM will reach out to [facility] as family's next choice to have them review for possible admit.    Call made to [facility]- per Cornelia Copa agreeable to review- they will not have any beds until Sun/Mon most likely.  Referral placed to [facility] in the [facility] for review.   1400- [facility] to reach out to grandson to offer med for possible Monday admit.      Barriers to Discharge: Continued Medical Work up  Expected Discharge Plan and Services In-house Referral: Clinical Social Work     Living arrangements for the past 2 months: Single Family Home                                       Social Determinants of Health (SDOH) Interventions SDOH Screenings   Food Insecurity: No Food Insecurity (08/03/2023)  Housing: Low Risk  (08/03/2023)  Transportation Needs: No Transportation Needs (08/03/2023)  Utilities: Not At Risk (08/03/2023)  Depression (PHQ2-9): Low Risk  (12/12/2020)  Physical Activity: Inactive (12/12/2020)  Tobacco Use: Medium Risk (08/01/2023)    Readmission Risk Interventions    08/03/2023   11:15 AM 07/13/2023   11:15 AM 05/24/2023    2:10 PM  Readmission Risk Prevention Plan  Transportation Screening Complete Complete Complete  HRI or Home Care Consult   Complete  Social Work Consult for Recovery Care Planning/Counseling    Complete  Palliative Care Screening   Complete  Medication Review [facility]) Referral to Pharmacy Complete Complete  PCP or Specialist appointment within 3-5 days of discharge Complete    HRI or Home Care Consult Complete Complete   SW Recovery Care/Counseling Consult Complete Complete   Palliative Care Screening Not Applicable Not Applicable   Skilled Nursing Facility Not Applicable Not Applicable

## 2023-08-13 NOTE — Progress Notes (Signed)
Late entry  HOSPITALIST ROUNDING NOTE Lisa [name] [id number]  DOB: 08/08/1934  DOA: 07/31/2023  PCP: [name], MD  08/13/2023,6:12 PM   LOS: 12 days      Code Status: Full From: Home  current Dispo: Eventual skilled     89 year old black female known COPD complicated by chronic respiratory failure on 2 L of oxygen CKD 3B Paroxysmal A-fib not on anticoagulation secondary to prior GI bleed Moderate AS mild MR with severe pulmonary hypertension (group 2) Myelodysplastic syndrome with CML nature follows with [facility] oncology Dr. [name]  LUL lobectomy and breast cancer 4.8 cm AAA not a candidate for repair Prior endoscopy 10/23/2020 hiatal hernia normal esophagus Rx H. pylori at the time-on evaluation 04/2023 for reported dark stool it was decided to hold off on evaluation of colon because of high risk and NSTEMI  Recently discharged after 9/9-9/12 hospitalization with acute on chronic respiratory failure and NSTEMI with bilateral effusions was treated with Lasix and heparin gtt. at the time  Re-presented to the emergency room shortness of breath not eating drinking progressively more weak Sodium 118, potassium 5.5, Lokelma started, treated with IV fluid tolvaptan given 9/29 nephrology consulted 10/1 transfused 2 units 10/2 more lethargic CCM consulted 10/6 Neupogen started after discussion with oncologist Dr. [name]   Plan  Multifactorial hyponatremia-prior SIADH, had hypovolemic hyponatrmia on admission Cortisol normal --aldosterone renin ratio 2.3 has now--resolved and is improved-no further workup  Concern for aspiration CXR 10/2 Waxing waning symptoms with hypoxia increasing lethargy on 10/2 as well as on 10/4 Last CXR 10/9 s cardiomegaly no pneumonia Speech last saw on 10/7 recommended dysphagia 3-she is at risk for recurrent aspiration and would benefit from CIR  Acute hypoxic respiratory failure on arrival secondary to diastolic heart failure + pneumonia Previous  LVEF 718 2460-65%, here 9/9 echo shows grade 2 diastolic dysfunction Nephrology engaged and recommending Lasix--was on varying higher dose Lasix settling on 20 daily Check labs make sure not azotemic in outpatient  Metabolic encephalopathy Improved with treatment of pneumonia--much improved overall--coherent and has improved and is ambulatory  Myelodysplastic syndrome/prior lung cancer with lobectomy, prior breast cancer--underlying pancytopenia as well Stage I breast cancer in 1999 with lumpectomy received tamoxifen for 5 years-last mammogram etc. as per oncologist Stage I adenocarcinoma LUL lobectomy 2006 high risk for dysplastic MDS CMML tlast platelets in the outpatient  Watch platelet count and labs given she has underlying myelodysplasia-May need to resume neutropenic precautions if worsens or if gets febrile in the next 24 hours--- she may need redosing of Neupogen?  4.8 cm AAA not candidate for repair Advanced age etc. precludes  Paroxysmal A-fib not a candidate for anticoagulation?  GI bleed in the past-likely not on anticoagulation because of thrombocytopenia Group 2 Pulmonary hypertension moderate AS mild MR Continues on metoprolol XL 25 daily Imdur 30 ? Candidate for DOAC 2/2 tcp? Continues on aspirin 81 daily  Chronic respiratory failure on 2 L of oxygen Respiratory failure secondary to diastolic heart failure-uses 3 L of oxygen at home-for the pasrtf 1-2 yr  CKD 3A Hyperkalemic on admission -improved with volume veltessa--no further rx  Multifactorial anemia--macrocytic kinetics--iron 67 saturation ratio is 24 As above--gets ESA at oncology office--- folic acid started here--- given elevated folate levels likely can hold  No family present today   DVT prophylaxis:   SCD  Status is: Inpatient Remains inpatient appropriate because:   Still somewhat volume overloaded needs gentle diuresis    Subjective:  Overall well looks comfortable  Objective +  exam Vitals:   08/13/23 0800 08/13/23 1243 08/13/23 1400 08/13/23 1700  BP: (!) 122/49 (!) 114/49 (!) 119/44 (!) 120/59  Pulse: 64 65 67 70  Resp: 18 20  20   Temp: 97.8 F (36.6 C) 98 F (36.7 C)  98.4 F (36.9 C)  TempSrc: Oral Oral  Oral  SpO2: (!) 85% 93% 91% 96%  Weight:      Height:       Filed Weights   08/10/23 0603 08/11/23 0439 08/12/23 0500  Weight: 67.8 kg 66.4 kg 68.7 kg    Examination:  Well-appearing no distress Holosystolic murmur Moderate air entry Abdomen soft No lower extremity edema  Data Reviewed: reviewed   CBC    Component Value Date/Time   WBC 3.9 (L) 08/13/2023 0544   RBC 2.66 (L) 08/13/2023 0544   HGB 8.0 (L) 08/13/2023 0544   HCT 27.8 (L) 08/13/2023 0544   HCT 30.0 (L) 05/14/2021 1446   PLT 65 (L) 08/13/2023 0544   MCV 104.5 (H) 08/13/2023 0544   MCH 30.1 08/13/2023 0544   MCHC 28.8 (L) 08/13/2023 0544   RDW 18.8 (H) 08/13/2023 0544   LYMPHSABS 1.5 08/13/2023 0544   MONOABS 1.7 (H) 08/13/2023 0544   EOSABS 0.0 08/13/2023 0544   BASOSABS 0.0 08/13/2023 0544      Latest Ref Rng & Units 08/13/2023    5:44 AM 08/12/2023    9:57 AM 08/11/2023    3:42 AM  CMP  Glucose 70 - 99 mg/dL 80  99  84   BUN 8 - 23 mg/dL 19  16  21    Creatinine 0.44 - 1.00 mg/dL 1.32  4.40  1.02   Sodium 135 - 145 mmol/L 136  135  139   Potassium 3.5 - 5.1 mmol/L 3.6  3.5  3.7   Chloride 98 - 111 mmol/L 88  89  88   CO2 22 - 32 mmol/L 36  36  36   Calcium 8.9 - 10.3 mg/dL 9.0  9.1  9.3   Total Protein 6.5 - 8.1 g/dL 7.5     Total Bilirubin 0.3 - 1.2 mg/dL 0.9     Alkaline Phos 38 - 126 U/L 39     AST 15 - 41 U/L 30     ALT 0 - 44 U/L 13        Scheduled Meds:  aspirin EC  81 mg Oral Daily   Chlorhexidine Gluconate Cloth  6 each Topical Daily   feeding supplement (NEPRO CARB STEADY)  237 mL Oral BID BM   fluticasone furoate-vilanterol  1 puff Inhalation Daily   And   umeclidinium bromide  1 puff Inhalation Daily   furosemide  20 mg Oral Daily    guaiFENesin  600 mg Oral BID   isosorbide mononitrate  15 mg Oral Daily   levothyroxine  100 mcg Oral Q0600   melatonin  3 mg Oral QHS   metoprolol succinate  25 mg Oral Daily   multivitamin with minerals  1 tablet Oral Daily   mouth rinse  15 mL Mouth Rinse 4 times per day   pravastatin  40 mg Oral QPM   psyllium  1 packet Oral Daily   senna  1 tablet Oral QHS   Continuous Infusions:    Time  27  Rhetta Mura, MD  [facility]

## 2023-08-13 NOTE — Progress Notes (Signed)
Received order for voiding trial Foley clamped x Pt without any urge to void Made approx 50cc urine Foley left in place

## 2023-08-14 DIAGNOSIS — E871 Hypo-osmolality and hyponatremia: Secondary | ICD-10-CM | POA: Diagnosis not present

## 2023-08-14 LAB — GLUCOSE, CAPILLARY: Glucose-Capillary: 86 mg/dL (ref 70–99)

## 2023-08-14 LAB — CBC WITH DIFFERENTIAL/PLATELET
Abs Immature Granulocytes: 0.13 10*3/uL — ABNORMAL HIGH (ref 0.00–0.07)
Basophils Absolute: 0 10*3/uL (ref 0.0–0.1)
Basophils Relative: 0 %
Eosinophils Absolute: 0 10*3/uL (ref 0.0–0.5)
Eosinophils Relative: 0 %
HCT: 25.5 % — ABNORMAL LOW (ref 36.0–46.0)
Hemoglobin: 7.3 g/dL — ABNORMAL LOW (ref 12.0–15.0)
Immature Granulocytes: 4 %
Lymphocytes Relative: 46 %
Lymphs Abs: 1.6 10*3/uL (ref 0.7–4.0)
MCH: 29 pg (ref 26.0–34.0)
MCHC: 28.6 g/dL — ABNORMAL LOW (ref 30.0–36.0)
MCV: 101.2 fL — ABNORMAL HIGH (ref 80.0–100.0)
Monocytes Absolute: 1.3 10*3/uL — ABNORMAL HIGH (ref 0.1–1.0)
Monocytes Relative: 37 %
Neutro Abs: 0.5 10*3/uL — ABNORMAL LOW (ref 1.7–7.7)
Neutrophils Relative %: 13 %
Platelets: 69 10*3/uL — ABNORMAL LOW (ref 150–400)
RBC: 2.52 MIL/uL — ABNORMAL LOW (ref 3.87–5.11)
RDW: 18.9 % — ABNORMAL HIGH (ref 11.5–15.5)
WBC: 3.5 10*3/uL — ABNORMAL LOW (ref 4.0–10.5)
nRBC: 5.4 % — ABNORMAL HIGH (ref 0.0–0.2)

## 2023-08-14 LAB — COMPREHENSIVE METABOLIC PANEL
ALT: 12 U/L (ref 0–44)
AST: 33 U/L (ref 15–41)
Albumin: 2.4 g/dL — ABNORMAL LOW (ref 3.5–5.0)
Alkaline Phosphatase: 38 U/L (ref 38–126)
Anion gap: 10 (ref 5–15)
BUN: 17 mg/dL (ref 8–23)
CO2: 36 mmol/L — ABNORMAL HIGH (ref 22–32)
Calcium: 9.4 mg/dL (ref 8.9–10.3)
Chloride: 89 mmol/L — ABNORMAL LOW (ref 98–111)
Creatinine, Ser: 0.89 mg/dL (ref 0.44–1.00)
GFR, Estimated: >60 mL/min (ref >60)
Glucose, Bld: 85 mg/dL (ref 70–99)
Potassium: 3.8 mmol/L (ref 3.5–5.1)
Sodium: 135 mmol/L (ref 135–145)
Total Bilirubin: 0.8 mg/dL (ref 0.3–1.2)
Total Protein: 7.5 g/dL (ref 6.5–8.1)

## 2023-08-14 NOTE — Progress Notes (Signed)
Mobility Specialist Progress Note:   08/14/23 1600  Mobility  Activity Ambulated with assistance in room  Level of Assistance Minimal assist, patient does 75% or more  Assistive Device Front wheel walker  Distance Ambulated (ft) 25 ft  Activity Response Tolerated well  Mobility Referral Yes  $Mobility charge 1 Mobility  Mobility Specialist Start Time (ACUTE ONLY) 1551  Mobility Specialist Stop Time (ACUTE ONLY) 1613  Mobility Specialist Time Calculation (min) (ACUTE ONLY) 22 min    Pre Mobility: 76 HR,  90% SpO2 During Mobility: 109 HR Post Mobility:  76 HR,  92% SpO2  Pt received in bed, agreeable to mobility. Asymptomatic throughout w/ no complaints. Pt left in bed with call bell and family present.  Lisa [name] Mobility Specialist Please contact via Special educational needs teacher or Rehab office at [phone]

## 2023-08-14 NOTE — Progress Notes (Addendum)
HOSPITALIST ROUNDING NOTE Lisa [name] [id number]  DOB: Jun 11, 1934  DOA: 07/31/2023  PCP: [name], MD  08/14/2023,4:51 PM   LOS: 13 days      Code Status: Full From: Home  current Dispo: Eventual skilled     89 year old black female known COPD complicated by chronic respiratory failure on 2 L of oxygen CKD 3B Paroxysmal A-fib not on anticoagulation secondary to prior GI bleed Moderate AS mild MR with severe pulmonary hypertension (group 2) Myelodysplastic syndrome with CML nature follows with [facility] oncology Dr. [name]  LUL lobectomy and breast cancer 4.8 cm AAA not a candidate for repair Prior endoscopy 10/23/2020 hiatal hernia normal esophagus Rx H. pylori at the time-on evaluation 04/2023 for reported dark stool it was decided to hold off on evaluation of colon because of high risk and NSTEMI  Recently discharged after 9/9-9/12 hospitalization with acute on chronic respiratory failure and NSTEMI with bilateral effusions was treated with Lasix and heparin gtt. at the time  Re-presented to the emergency room shortness of breath not eating drinking progressively more weak Sodium 118, potassium 5.5, Lokelma started, treated with IV fluid tolvaptan given 9/29 nephrology consulted 10/1 transfused 2 units 10/2 more lethargic CCM consulted 10/6 Neupogen started after discussion with oncologist Dr. [name]   Plan  Multifactorial hyponatremia-prior SIADH, had hypovolemic hyponatrmia this is resolved and improved Cortisol normal --aldosterone renin ratio 2.3  Concern for aspiration CXR 10/2 Waxing waning sensorium 10/2 as well as on 10/4 resolved Last CXR 10/9 s cardiomegaly no pneumonia Speech last saw on 10/7 recommended dysphagia 3-work with SLP at CIR  Acute hypoxic respiratory failure on arrival secondary to diastolic heart failure + pneumonia Previous LVEF 718 2460-65%, here 9/9 echo shows grade 2 diastolic dysfunction Nephrology engaged and recommending Lasix--was on  varying higher dose Lasix settling on 20 daily Periodic labs at this time Watch her bicarb on Chem-7 as if she becomes alkalotic can get confused  Metabolic encephalopathy Improved with treatment of pneumonia--much improved overall--coherent and has improved and is ambulatory  Myelodysplastic syndrome/prior lung cancer with lobectomy, prior breast cancer--underlying pancytopenia as well Stage I breast cancer in 1999 with lumpectomy received tamoxifen for 5 years-last mammogram etc. as per oncologist Stage I adenocarcinoma LUL lobectomy 2006 high risk for dysplastic MDS CMML tlast platelets in the outpatient --require definitive discussion with oncology as an outpatient about planning ANC stays right at 500 she has MDS-if it stays at this level I will ask oncology if I should give her another dose of Neupogen before she leaves  4.8 cm AAA not candidate for repair Advanced age etc. precludes repair etc.  Paroxysmal A-fib not a candidate for anticoagulation?  GI bleed in the past-likely not on anticoagulation because of thrombocytopenia Group 2 Pulmonary hypertension moderate AS mild MR Continues on metoprolol XL 25 daily Imdur 15 mg Continues on aspirin 81 daily  Chronic respiratory failure on 2 L of oxygen Respiratory failure secondary to diastolic heart failure-uses 3 L of oxygen at home-for the pasrtf 1-2 yr  CKD 3A Hyperkalemic on admission -improved with volume veltessa--no further rx  Multifactorial anemia--macrocytic kinetics--iron 67 saturation ratio is 24 As above--gets ESA at oncology office--- folic acid started here--- given elevated folate levels likely can hold  D/w son present bedsdie today   DVT prophylaxis:   SCD  Status is: Inpatient Remains inpatient appropriate because:   Still somewhat volume overloaded needs gentle diuresis    Subjective:  Sitting up in bed asking if she can go to  rehab Overall looks much better her son is at bedside She had some  good breakfast today I explained to them neutropenic precautions they understand not to bring her fresh fruit well-appearing  Objective + exam Vitals:   08/14/23 1000 08/14/23 1200 08/14/23 1400 08/14/23 1625  BP: (!) 151/65 (!) 132/58 135/61 133/88  Pulse: 66 69 72 75  Resp: (!) 34 (!) 33 (!) 28 17  Temp: 98.1 F (36.7 C) 98.1 F (36.7 C)  98.6 F (37 C)  TempSrc: Oral   Oral  SpO2: 90% 98% 98% 96%  Weight:      Height:       Filed Weights   08/10/23 0603 08/11/23 0439 08/12/23 0500  Weight: 67.8 kg 66.4 kg 68.7 kg    Examination:  Well-appearing on oxygen no distress looks fair Chest is clear no wheeze rales rhonchi S1-S2 holosystolic murmur across precordium Abdomen soft no rebound ROM intact   Data Reviewed: reviewed   CBC    Component Value Date/Time   WBC 3.5 (L) 08/14/2023 0435   RBC 2.52 (L) 08/14/2023 0435   HGB 7.3 (L) 08/14/2023 0435   HCT 25.5 (L) 08/14/2023 0435   HCT 30.0 (L) 05/14/2021 1446   PLT 69 (L) 08/14/2023 0435   MCV 101.2 (H) 08/14/2023 0435   MCH 29.0 08/14/2023 0435   MCHC 28.6 (L) 08/14/2023 0435   RDW 18.9 (H) 08/14/2023 0435   LYMPHSABS 1.6 08/14/2023 0435   MONOABS 1.3 (H) 08/14/2023 0435   EOSABS 0.0 08/14/2023 0435   BASOSABS 0.0 08/14/2023 0435      Latest Ref Rng & Units 08/14/2023    4:35 AM 08/13/2023    5:44 AM 08/12/2023    9:57 AM  CMP  Glucose 70 - 99 mg/dL 85  80  99   BUN 8 - 23 mg/dL 17  19  16    Creatinine 0.44 - 1.00 mg/dL 0.27  2.53  6.64   Sodium 135 - 145 mmol/L 135  136  135   Potassium 3.5 - 5.1 mmol/L 3.8  3.6  3.5   Chloride 98 - 111 mmol/L 89  88  89   CO2 22 - 32 mmol/L 36  36  36   Calcium 8.9 - 10.3 mg/dL 9.4  9.0  9.1   Total Protein 6.5 - 8.1 g/dL 7.5  7.5    Total Bilirubin 0.3 - 1.2 mg/dL 0.8  0.9    Alkaline Phos 38 - 126 U/L 38  39    AST 15 - 41 U/L 33  30    ALT 0 - 44 U/L 12  13       Scheduled Meds:  aspirin EC  81 mg Oral Daily   Chlorhexidine Gluconate Cloth  6 each Topical  Daily   feeding supplement (NEPRO CARB STEADY)  237 mL Oral BID BM   fluticasone furoate-vilanterol  1 puff Inhalation Daily   And   umeclidinium bromide  1 puff Inhalation Daily   furosemide  20 mg Oral Daily   guaiFENesin  600 mg Oral BID   isosorbide mononitrate  15 mg Oral Daily   levothyroxine  100 mcg Oral Q0600   melatonin  3 mg Oral QHS   metoprolol succinate  25 mg Oral Daily   multivitamin with minerals  1 tablet Oral Daily   mouth rinse  15 mL Mouth Rinse 4 times per day   pravastatin  40 mg Oral QPM   psyllium  1 packet Oral Daily  senna  1 tablet Oral QHS   Continuous Infusions:    Time  27  Rhetta Mura, MD  [facility]

## 2023-08-14 NOTE — Plan of Care (Signed)

## 2023-08-15 DIAGNOSIS — E871 Hypo-osmolality and hyponatremia: Secondary | ICD-10-CM | POA: Diagnosis not present

## 2023-08-15 DIAGNOSIS — Z515 Encounter for palliative care: Secondary | ICD-10-CM

## 2023-08-15 DIAGNOSIS — L8990 Pressure ulcer of unspecified site, unspecified stage: Secondary | ICD-10-CM | POA: Insufficient documentation

## 2023-08-15 LAB — COMPREHENSIVE METABOLIC PANEL
ALT: 12 U/L (ref 0–44)
AST: 29 U/L (ref 15–41)
Albumin: 2.7 g/dL — ABNORMAL LOW (ref 3.5–5.0)
Alkaline Phosphatase: 38 U/L (ref 38–126)
Anion gap: 9 (ref 5–15)
BUN: 15 mg/dL (ref 8–23)
CO2: 35 mmol/L — ABNORMAL HIGH (ref 22–32)
Calcium: 9.4 mg/dL (ref 8.9–10.3)
Chloride: 89 mmol/L — ABNORMAL LOW (ref 98–111)
Creatinine, Ser: 0.87 mg/dL (ref 0.44–1.00)
GFR, Estimated: >60 mL/min (ref >60)
Glucose, Bld: 84 mg/dL (ref 70–99)
Potassium: 3.7 mmol/L (ref 3.5–5.1)
Sodium: 133 mmol/L — ABNORMAL LOW (ref 135–145)
Total Bilirubin: 0.8 mg/dL (ref 0.3–1.2)
Total Protein: 8.2 g/dL — ABNORMAL HIGH (ref 6.5–8.1)

## 2023-08-15 LAB — CBC WITH DIFFERENTIAL/PLATELET
Abs Immature Granulocytes: 0.18 10*3/uL — ABNORMAL HIGH (ref 0.00–0.07)
Basophils Absolute: 0 10*3/uL (ref 0.0–0.1)
Basophils Relative: 0 %
Eosinophils Absolute: 0 10*3/uL (ref 0.0–0.5)
Eosinophils Relative: 0 %
HCT: 25.9 % — ABNORMAL LOW (ref 36.0–46.0)
Hemoglobin: 7.5 g/dL — ABNORMAL LOW (ref 12.0–15.0)
Immature Granulocytes: 5 %
Lymphocytes Relative: 41 %
Lymphs Abs: 1.6 10*3/uL (ref 0.7–4.0)
MCH: 29.5 pg (ref 26.0–34.0)
MCHC: 29 g/dL — ABNORMAL LOW (ref 30.0–36.0)
MCV: 102 fL — ABNORMAL HIGH (ref 80.0–100.0)
Monocytes Absolute: 1.4 10*3/uL — ABNORMAL HIGH (ref 0.1–1.0)
Monocytes Relative: 36 %
Neutro Abs: 0.7 10*3/uL — ABNORMAL LOW (ref 1.7–7.7)
Neutrophils Relative %: 18 %
Platelets: 73 10*3/uL — ABNORMAL LOW (ref 150–400)
RBC: 2.54 MIL/uL — ABNORMAL LOW (ref 3.87–5.11)
RDW: 18.9 % — ABNORMAL HIGH (ref 11.5–15.5)
WBC: 3.9 10*3/uL — ABNORMAL LOW (ref 4.0–10.5)
nRBC: 4.4 % — ABNORMAL HIGH (ref 0.0–0.2)

## 2023-08-15 LAB — GLUCOSE, CAPILLARY: Glucose-Capillary: 82 mg/dL (ref 70–99)

## 2023-08-15 NOTE — Plan of Care (Signed)
?  Problem: Clinical Measurements: ?Goal: Ability to maintain clinical measurements within normal limits will improve ?Outcome: Progressing ?Goal: Will remain free from infection ?Outcome: Progressing ?Goal: Diagnostic test results will improve ?Outcome: Progressing ?  ?

## 2023-08-15 NOTE — Progress Notes (Signed)
HOSPITALIST ROUNDING NOTE Lisa [name] [id number]  DOB: Jul 03, 1934  DOA: 07/31/2023  PCP: [name], MD  08/15/2023,3:12 PM   LOS: 14 days      Code Status: Full From: Home  current Dispo: Eventual skilled     89 year old black female known COPD complicated by chronic respiratory failure on 2 L of oxygen CKD 3B Paroxysmal A-fib not on anticoagulation secondary to prior GI bleed Moderate AS mild MR with severe pulmonary hypertension (group 2) Myelodysplastic syndrome with CML nature follows with [facility] oncology Dr. [name]  LUL lobectomy and breast cancer 4.8 cm AAA not a candidate for repair Prior endoscopy 10/23/2020 hiatal hernia normal esophagus Rx H. pylori at the time-on evaluation 04/2023 for reported dark stool it was decided to hold off on evaluation of colon because of high risk and NSTEMI  Recently discharged after 9/9-9/12 hospitalization with acute on chronic respiratory failure and NSTEMI with bilateral effusions was treated with Lasix and heparin gtt. at the time  Re-presented to the emergency room shortness of breath not eating drinking progressively more weak Sodium 118, potassium 5.5, Lokelma started, treated with IV fluid tolvaptan given 9/29 nephrology consulted 10/1 transfused 2 units 10/2 more lethargic CCM consulted 10/6 Neupogen started after discussion with oncologist Dr. [name]   Plan  Multifactorial hyponatremia-prior SIADH, had hypovolemic hyponatrmia this is resolved and improved Cortisol normal --aldosterone renin ratio 2.3  Concern for aspiration CXR 10/2 Waxing waning sensorium 10/2 as well as on 10/4 resolved Last CXR 10/9 s cardiomegaly no pneumonia Speech last saw on 10/7 recommended dysphagia 3-work with SLP at CIR--improved overall  Acute hypoxic respiratory failure on arrival secondary to diastolic heart failure + pneumonia Previous LVEF 718 2460-65%, here 9/9 echo shows grade 2 diastolic dysfunction Nephrology engaged and  recommending Lasix--was on varying higher dose Lasix settling on 20 daily Periodic labs at this time Watch her bicarb on Chem-7 periodically  Metabolic encephalopathy Improved with treatment of pneumonia--much improved overall- improved  Myelodysplastic syndrome/prior lung cancer with lobectomy, prior breast cancer--underlying pancytopenia as well Stage I breast cancer in 1999 with lumpectomy received tamoxifen for 5 years-last mammogram etc. as per oncologist Stage I adenocarcinoma LUL lobectomy 2006 high risk for dysplastic MDS CMML tlast platelets in the outpatient --require definitive discussion with oncology as an outpatient about planning ANC fluctuant--hold Neupogen till OP follow-up Dr. [name]  4.8 cm AAA not candidate for repair Advanced age etc. precludes repair etc.  Paroxysmal A-fib not a candidate for anticoagulation?  GI bleed in the past-likely not on anticoagulation because of thrombocytopenia Group 2 Pulmonary hypertension moderate AS mild MR Continues on metoprolol XL 25 daily Imdur 15 mg Continues on aspirin 81 daily  Chronic respiratory failure on 2 L of oxygen Respiratory failure secondary to diastolic heart failure-continue 3 L of oxygen  CKD 3A Hyperkalemic on admission -improved with volume veltessa--no further rx  Multifactorial anemia--macrocytic kinetics--iron 67 saturation ratio is 24 As above--gets ESA at oncology office--- folic acid started here--- given elevated folate levels likely can hold  No family + today Left VM for grandson   DVT prophylaxis:   SCD  Status is: Inpatient Remains inpatient appropriate because:   Still somewhat volume overloaded needs gentle diuresis    Subjective:  No changes-continues to looks well Small BM today No cp fever mild occasional cough  Objective + exam Vitals:   08/15/23 1000 08/15/23 1200 08/15/23 1226 08/15/23 1400  BP: (!) 115/51 132/60 (!) 112/58 (!) 118/55  Pulse: 69 65 65  Resp: (!) 37  (!) 23 18 20   Temp:   97.7 F (36.5 C)   TempSrc:   Oral   SpO2: 96% 99% 96%   Weight:      Height:       Filed Weights   08/10/23 0603 08/11/23 0439 08/12/23 0500  Weight: 67.8 kg 66.4 kg 68.7 kg    Examination:  No changes  Well-appearing on oxygen no distress looks fair Chest is clear no wheeze rales rhonchi S1-S2 holosystolic murmur across precordium Abdomen soft no rebound ROM intact   Data Reviewed: reviewed   CBC    Component Value Date/Time   WBC 3.9 (L) 08/15/2023 0356   RBC 2.54 (L) 08/15/2023 0356   HGB 7.5 (L) 08/15/2023 0356   HCT 25.9 (L) 08/15/2023 0356   HCT 30.0 (L) 05/14/2021 1446   PLT 73 (L) 08/15/2023 0356   MCV 102.0 (H) 08/15/2023 0356   MCH 29.5 08/15/2023 0356   MCHC 29.0 (L) 08/15/2023 0356   RDW 18.9 (H) 08/15/2023 0356   LYMPHSABS 1.6 08/15/2023 0356   MONOABS 1.4 (H) 08/15/2023 0356   EOSABS 0.0 08/15/2023 0356   BASOSABS 0.0 08/15/2023 0356      Latest Ref Rng & Units 08/15/2023    3:56 AM 08/14/2023    4:35 AM 08/13/2023    5:44 AM  CMP  Glucose 70 - 99 mg/dL 84  85  80   BUN 8 - 23 mg/dL 15  17  19    Creatinine 0.44 - 1.00 mg/dL 2.13  0.86  5.78   Sodium 135 - 145 mmol/L 133  135  136   Potassium 3.5 - 5.1 mmol/L 3.7  3.8  3.6   Chloride 98 - 111 mmol/L 89  89  88   CO2 22 - 32 mmol/L 35  36  36   Calcium 8.9 - 10.3 mg/dL 9.4  9.4  9.0   Total Protein 6.5 - 8.1 g/dL 8.2  7.5  7.5   Total Bilirubin 0.3 - 1.2 mg/dL 0.8  0.8  0.9   Alkaline Phos 38 - 126 U/L 38  38  39   AST 15 - 41 U/L 29  33  30   ALT 0 - 44 U/L 12  12  13       Scheduled Meds:  aspirin EC  81 mg Oral Daily   Chlorhexidine Gluconate Cloth  6 each Topical Daily   feeding supplement (NEPRO CARB STEADY)  237 mL Oral BID BM   fluticasone furoate-vilanterol  1 puff Inhalation Daily   And   umeclidinium bromide  1 puff Inhalation Daily   furosemide  20 mg Oral Daily   guaiFENesin  600 mg Oral BID   isosorbide mononitrate  15 mg Oral Daily    levothyroxine  100 mcg Oral Q0600   melatonin  3 mg Oral QHS   metoprolol succinate  25 mg Oral Daily   multivitamin with minerals  1 tablet Oral Daily   mouth rinse  15 mL Mouth Rinse 4 times per day   pravastatin  40 mg Oral QPM   psyllium  1 packet Oral Daily   senna  1 tablet Oral QHS   Continuous Infusions:    Time  27  Rhetta Mura, MD  [facility]

## 2023-08-16 DIAGNOSIS — I5030 Unspecified diastolic (congestive) heart failure: Secondary | ICD-10-CM | POA: Diagnosis not present

## 2023-08-16 DIAGNOSIS — N1832 Chronic kidney disease, stage 3b: Secondary | ICD-10-CM | POA: Diagnosis not present

## 2023-08-16 DIAGNOSIS — R9082 White matter disease, unspecified: Secondary | ICD-10-CM | POA: Diagnosis not present

## 2023-08-16 DIAGNOSIS — J811 Chronic pulmonary edema: Secondary | ICD-10-CM | POA: Diagnosis not present

## 2023-08-16 DIAGNOSIS — R627 Adult failure to thrive: Secondary | ICD-10-CM | POA: Diagnosis not present

## 2023-08-16 DIAGNOSIS — I340 Nonrheumatic mitral (valve) insufficiency: Secondary | ICD-10-CM | POA: Diagnosis not present

## 2023-08-16 DIAGNOSIS — R0600 Dyspnea, unspecified: Secondary | ICD-10-CM | POA: Diagnosis not present

## 2023-08-16 DIAGNOSIS — R918 Other nonspecific abnormal finding of lung field: Secondary | ICD-10-CM | POA: Diagnosis not present

## 2023-08-16 DIAGNOSIS — G9341 Metabolic encephalopathy: Secondary | ICD-10-CM | POA: Diagnosis not present

## 2023-08-16 DIAGNOSIS — D708 Other neutropenia: Secondary | ICD-10-CM | POA: Diagnosis not present

## 2023-08-16 DIAGNOSIS — Z87440 Personal history of urinary (tract) infections: Secondary | ICD-10-CM | POA: Diagnosis not present

## 2023-08-16 DIAGNOSIS — I272 Pulmonary hypertension, unspecified: Secondary | ICD-10-CM | POA: Diagnosis not present

## 2023-08-16 DIAGNOSIS — R1311 Dysphagia, oral phase: Secondary | ICD-10-CM | POA: Diagnosis not present

## 2023-08-16 DIAGNOSIS — D696 Thrombocytopenia, unspecified: Secondary | ICD-10-CM | POA: Diagnosis not present

## 2023-08-16 DIAGNOSIS — R4182 Altered mental status, unspecified: Secondary | ICD-10-CM | POA: Diagnosis not present

## 2023-08-16 DIAGNOSIS — I252 Old myocardial infarction: Secondary | ICD-10-CM | POA: Diagnosis not present

## 2023-08-16 DIAGNOSIS — K219 Gastro-esophageal reflux disease without esophagitis: Secondary | ICD-10-CM | POA: Diagnosis not present

## 2023-08-16 DIAGNOSIS — N183 Chronic kidney disease, stage 3 unspecified: Secondary | ICD-10-CM | POA: Diagnosis not present

## 2023-08-16 DIAGNOSIS — L89152 Pressure ulcer of sacral region, stage 2: Secondary | ICD-10-CM | POA: Diagnosis not present

## 2023-08-16 DIAGNOSIS — D469 Myelodysplastic syndrome, unspecified: Secondary | ICD-10-CM | POA: Diagnosis not present

## 2023-08-16 DIAGNOSIS — R531 Weakness: Secondary | ICD-10-CM | POA: Diagnosis not present

## 2023-08-16 DIAGNOSIS — J9610 Chronic respiratory failure, unspecified whether with hypoxia or hypercapnia: Secondary | ICD-10-CM | POA: Diagnosis not present

## 2023-08-16 DIAGNOSIS — Z466 Encounter for fitting and adjustment of urinary device: Secondary | ICD-10-CM | POA: Diagnosis not present

## 2023-08-16 DIAGNOSIS — R410 Disorientation, unspecified: Secondary | ICD-10-CM | POA: Diagnosis not present

## 2023-08-16 DIAGNOSIS — E871 Hypo-osmolality and hyponatremia: Secondary | ICD-10-CM | POA: Diagnosis not present

## 2023-08-16 DIAGNOSIS — E785 Hyperlipidemia, unspecified: Secondary | ICD-10-CM | POA: Diagnosis not present

## 2023-08-16 DIAGNOSIS — Z23 Encounter for immunization: Secondary | ICD-10-CM | POA: Diagnosis not present

## 2023-08-16 DIAGNOSIS — J189 Pneumonia, unspecified organism: Secondary | ICD-10-CM | POA: Diagnosis not present

## 2023-08-16 DIAGNOSIS — R911 Solitary pulmonary nodule: Secondary | ICD-10-CM | POA: Diagnosis not present

## 2023-08-16 DIAGNOSIS — C946 Myelodysplastic disease, not classified: Secondary | ICD-10-CM | POA: Diagnosis not present

## 2023-08-16 DIAGNOSIS — N390 Urinary tract infection, site not specified: Secondary | ICD-10-CM | POA: Diagnosis not present

## 2023-08-16 DIAGNOSIS — I517 Cardiomegaly: Secondary | ICD-10-CM | POA: Diagnosis not present

## 2023-08-16 DIAGNOSIS — I110 Hypertensive heart disease with heart failure: Secondary | ICD-10-CM | POA: Diagnosis not present

## 2023-08-16 DIAGNOSIS — Z743 Need for continuous supervision: Secondary | ICD-10-CM | POA: Diagnosis not present

## 2023-08-16 DIAGNOSIS — Z7982 Long term (current) use of aspirin: Secondary | ICD-10-CM | POA: Diagnosis not present

## 2023-08-16 DIAGNOSIS — I4811 Longstanding persistent atrial fibrillation: Secondary | ICD-10-CM | POA: Diagnosis not present

## 2023-08-16 DIAGNOSIS — E222 Syndrome of inappropriate secretion of antidiuretic hormone: Secondary | ICD-10-CM | POA: Diagnosis not present

## 2023-08-16 DIAGNOSIS — Z902 Acquired absence of lung [part of]: Secondary | ICD-10-CM | POA: Diagnosis not present

## 2023-08-16 DIAGNOSIS — Z9981 Dependence on supplemental oxygen: Secondary | ICD-10-CM | POA: Diagnosis not present

## 2023-08-16 DIAGNOSIS — I480 Paroxysmal atrial fibrillation: Secondary | ICD-10-CM | POA: Diagnosis not present

## 2023-08-16 DIAGNOSIS — Z8679 Personal history of other diseases of the circulatory system: Secondary | ICD-10-CM | POA: Diagnosis not present

## 2023-08-16 DIAGNOSIS — R5081 Fever presenting with conditions classified elsewhere: Secondary | ICD-10-CM | POA: Diagnosis not present

## 2023-08-16 DIAGNOSIS — J449 Chronic obstructive pulmonary disease, unspecified: Secondary | ICD-10-CM | POA: Diagnosis not present

## 2023-08-16 DIAGNOSIS — I5032 Chronic diastolic (congestive) heart failure: Secondary | ICD-10-CM | POA: Diagnosis not present

## 2023-08-16 DIAGNOSIS — Z79890 Hormone replacement therapy (postmenopausal): Secondary | ICD-10-CM | POA: Diagnosis not present

## 2023-08-16 DIAGNOSIS — A419 Sepsis, unspecified organism: Secondary | ICD-10-CM | POA: Diagnosis not present

## 2023-08-16 DIAGNOSIS — I714 Abdominal aortic aneurysm, without rupture, unspecified: Secondary | ICD-10-CM | POA: Diagnosis not present

## 2023-08-16 DIAGNOSIS — B999 Unspecified infectious disease: Secondary | ICD-10-CM | POA: Diagnosis not present

## 2023-08-16 DIAGNOSIS — Z853 Personal history of malignant neoplasm of breast: Secondary | ICD-10-CM | POA: Diagnosis not present

## 2023-08-16 DIAGNOSIS — R5383 Other fatigue: Secondary | ICD-10-CM | POA: Diagnosis not present

## 2023-08-16 DIAGNOSIS — Z66 Do not resuscitate: Secondary | ICD-10-CM | POA: Diagnosis not present

## 2023-08-16 DIAGNOSIS — I10 Essential (primary) hypertension: Secondary | ICD-10-CM | POA: Diagnosis not present

## 2023-08-16 DIAGNOSIS — E039 Hypothyroidism, unspecified: Secondary | ICD-10-CM | POA: Diagnosis not present

## 2023-08-16 DIAGNOSIS — J90 Pleural effusion, not elsewhere classified: Secondary | ICD-10-CM | POA: Diagnosis not present

## 2023-08-16 DIAGNOSIS — J9621 Acute and chronic respiratory failure with hypoxia: Secondary | ICD-10-CM | POA: Diagnosis not present

## 2023-08-16 DIAGNOSIS — I130 Hypertensive heart and chronic kidney disease with heart failure and stage 1 through stage 4 chronic kidney disease, or unspecified chronic kidney disease: Secondary | ICD-10-CM | POA: Diagnosis not present

## 2023-08-16 DIAGNOSIS — R6521 Severe sepsis with septic shock: Secondary | ICD-10-CM | POA: Diagnosis not present

## 2023-08-16 DIAGNOSIS — Z85118 Personal history of other malignant neoplasm of bronchus and lung: Secondary | ICD-10-CM | POA: Diagnosis not present

## 2023-08-16 DIAGNOSIS — J690 Pneumonitis due to inhalation of food and vomit: Secondary | ICD-10-CM | POA: Diagnosis not present

## 2023-08-16 DIAGNOSIS — I350 Nonrheumatic aortic (valve) stenosis: Secondary | ICD-10-CM | POA: Diagnosis not present

## 2023-08-16 DIAGNOSIS — N179 Acute kidney failure, unspecified: Secondary | ICD-10-CM | POA: Diagnosis not present

## 2023-08-16 DIAGNOSIS — R2689 Other abnormalities of gait and mobility: Secondary | ICD-10-CM | POA: Diagnosis not present

## 2023-08-16 LAB — GLUCOSE, CAPILLARY: Glucose-Capillary: 86 mg/dL (ref 70–99)

## 2023-08-16 MED ORDER — FUROSEMIDE 20 MG PO TABS: 20.0000 mg | ORAL_TABLET | Freq: Every day | ORAL | Status: DC

## 2023-08-16 MED ORDER — ISOSORBIDE MONONITRATE ER 30 MG PO TB24: 15.0000 mg | ORAL_TABLET | Freq: Every day | ORAL | Status: DC

## 2023-08-16 NOTE — Progress Notes (Signed)
0800 patient alert able to make all needs known on [address] [place]  foley care completed

## 2023-08-16 NOTE — Discharge Summary (Signed)
Physician Discharge Summary  Lisa [name] JYN:[id number] DOB: 1934-10-25 DOA: 07/31/2023  PCP: [name], MD  Admit date: 07/31/2023 Discharge date: 08/16/2023  Time spent: 45 minutes  Recommendations for Outpatient Follow-up:  Needs Chem-7 CBC 1 week from discharge-check her bicarb as she has a risk for alkalosis Requires oxygen 2 L at all times Needs outpatient follow-up with oncology for MDS and multiple other cancers--- also can continue ESA at oncology office Routine screening for AAA as an outpatient as per PCP  Discharge Diagnoses:  MAIN problem for hospitalization   Toxic encephalopathy secondary to severe hyponatremia on admission and probable aspiration Aspiration pneumonia on admission with acute hypoxic respiratory failure superimposed on diastolic heart failure Underlying myelodysplasia with high risk MDS with baseline thrombocytopenia in the 50s Prior stage I breast cancer 1999, stage I lung cancer 2006 Inoperable 4.8 cm AAA needs outpatient follow-up Paroxysmal A-fib not on anticoagulation CHA2DS2-VASc 3, has bled score 3 CKD 3A with hyperkalemia this hospitalization improved Multifactorial anemia Pressure ulcer stage II sacrum present on admission  Please see below for itemized issues addressed in [facility]- refer to other progress notes for clarity if needed  Discharge Condition: Fair  Diet recommendation: Heart healthy  Filed Weights   08/10/23 0603 08/11/23 0439 08/12/23 0500  Weight: 67.8 kg 66.4 kg 68.7 kg    History of present illness:  89 year old black female known COPD complicated by chronic respiratory failure on 2 L of oxygen CKD 3B Paroxysmal A-fib not on anticoagulation secondary to prior GI bleed Moderate AS mild MR with severe pulmonary hypertension (group 2) Myelodysplastic syndrome with CML nature follows with [facility] oncology Dr. [name]             LUL lobectomy and breast cancer 4.8 cm AAA not a candidate for repair Prior endoscopy  10/23/2020 hiatal hernia normal esophagus Rx H. pylori at the time-on evaluation 04/2023 for reported dark stool it was decided to hold off on evaluation of colon because of high risk and NSTEMI   Recently discharged after 9/9-9/12 hospitalization with acute on chronic respiratory failure and NSTEMI with bilateral effusions was treated with Lasix and heparin gtt. at the time   Re-presented to the emergency room shortness of breath not eating drinking progressively more weak Sodium 118, potassium 5.5, Lokelma started, treated with IV fluid tolvaptan given 9/29 nephrology consulted 10/1 transfused 2 units 10/2 more lethargic CCM consulted 10/6 Neupogen started after discussion with oncologist Dr. Fannie Knee Course:  Toxic encephalopathy on admission secondary to multiple causes hyponatremia pneumonia as below  Multifactorial hyponatremia hypovolemic hyponatremia at admission-cortisol was normal aldosterone renin ratio was within normal limits This resolved with some fluids and then she was also diuresed as below-nephrology was consulted and she was on IV Lasix initially At discharge will be discharging on Lasix 20 daily and depending on labs this may need to be adjusted upward or downward as well as on her weights Watch her labs weekly and will need labs in 2 to 3 days at CIR  Hypoxic respiratory failure secondary to aspiration pneumonia Component of diastolic heart failure-prior EF 07/12/2023 = diastolic dysfunction Her pneumonia was treated appropriately between 10/2 through 08/10/2023-she remains relatively high risk for aspiration and will need to be watched on a dysphagia diet type III  Diastolic heart failure pulmonary hypertension with moderate AAS mild MR previously paroxysmal A-fib not an anticoagulation candidate because of MDS induced thrombocytopenia Continues metoprolol XL 25 Imdur was cut back to 15 Continues on aspirin 81 See  above with regards to Lasix does need to  continue this as above  CKD 3 with hyperkalemia on admission Was given Veltassa and the hyperkalemia improved Fluids are managed initially with IV fluid and then with Lasix she is going home on Lasix as above  High-grade MDS requiring definitive oncology discussion as outpatient Dr. [name] Prior breast cancer 1999, prior stage I lung cancer LUL lobectomy 2006 She has a chronic thrombocytopenia within the 50-100 range-her counts are relatively stable although she is not really a candidate given high risk of falls and bleeding for any anticoagulation-she can use aspirin at this time although this can be adjusted and discontinued if needed Continue ESA at oncology office get iron studies in the outpatient did not require them here as she had macrocytic kinetics and saturations were good Was given Neupogen this hospital stay for slightly low ANC and may need this followed up in the outpatient setting  Stage II decubitus ulcer on sacrum Turn frequently no definitive management needed   Discharge Exam: Vitals:   08/16/23 0337 08/16/23 0824  BP: (!) 128/56 (!) 127/54  Pulse: 74 73  Resp: 20 20  Temp: 98.8 F (37.1 C) (!) 97.5 F (36.4 C)  SpO2: 96% 100%    Subj on day of d/c   Awake coherent sitting up on oxygen no fever no chills no nausea Looks well  General Exam on discharge  EOMI NCAT no focal deficit no wheeze rales rhonchi Kyphotic Abdomen soft no rebound No lower extremity edema  neuro intact   Discharge Instructions   Discharge Instructions     Diet - low sodium heart healthy   Complete by: As directed    Increase activity slowly   Complete by: As directed    No dressing needed   Complete by: As directed    Pressure Injury 08/11/23 Sacrum Mid Stage 2 -  Partial thickness loss of dermis presenting as a shallow open injury with a red, pink wound bed without slough. dry,pink 5 days      Allergies as of 08/16/2023       Reactions   Meloxicam Other (See  Comments)   Caused an injury to the kidneys, per nephrologist   Micardis Hct [telmisartan-hctz] Other (See Comments)   Caused an injury to the kidneys, per nephrologist        Medication List     STOP taking these medications    albuterol 108 (90 Base) MCG/ACT inhaler Commonly known as: VENTOLIN HFA   magnesium oxide 400 MG tablet Commonly known as: MAG-OX   pantoprazole 40 MG tablet Commonly known as: PROTONIX   psyllium 95 % Pack Commonly known as: HYDROCIL/METAMUCIL       TAKE these medications    acetaminophen 325 MG tablet Commonly known as: TYLENOL Take 1.5-3 tablets (487.5-975 mg total) by mouth every 6 (six) hours as needed for mild pain (or Fever >/= 101).   aspirin EC 81 MG tablet Take 1 tablet (81 mg total) by mouth daily. Swallow whole.   furosemide 20 MG tablet Commonly known as: LASIX Take 1 tablet (20 mg total) by mouth daily.   isosorbide mononitrate 30 MG 24 hr tablet Commonly known as: IMDUR Take 0.5 tablets (15 mg total) by mouth daily. What changed:  medication strength how much to take   levothyroxine 100 MCG tablet Commonly known as: SYNTHROID Take 1 tablet (100 mcg total) by mouth daily before breakfast.   melatonin 3 MG Tabs tablet Take 1 tablet (3 mg  total) by mouth at bedtime.   metoprolol succinate 25 MG 24 hr tablet Commonly known as: TOPROL-XL TAKE 1 TABLET DAILY   mirtazapine 15 MG tablet Commonly known as: REMERON Take 1 tablet (15 mg total) by mouth at bedtime.   Multi-Vitamin tablet Take 1 tablet by mouth daily.   pravastatin 40 MG tablet Commonly known as: PRAVACHOL Take 1 tablet (40 mg total) by mouth every evening.   senna 8.6 MG Tabs tablet Commonly known as: SENOKOT Take 1 tablet (8.6 mg total) by mouth at bedtime.   Trelegy Ellipta 100-62.5-25 MCG/ACT Aepb Generic drug: Fluticasone-Umeclidin-Vilant Inhale 1 puff into the lungs daily.               Discharge Care Instructions  (From admission,  onward)           Start     Ordered   08/16/23 0000  No dressing needed       Comments: Pressure Injury 08/11/23 Sacrum Mid Stage 2 -  Partial thickness loss of dermis presenting as a shallow open injury with a red, pink wound bed without slough. dry,pink 5 days   08/16/23 0931           Allergies  Allergen Reactions   Meloxicam Other (See Comments)    Caused an injury to the kidneys, per nephrologist   Micardis Hct [Telmisartan-Hctz] Other (See Comments)    Caused an injury to the kidneys, per nephrologist    Follow-up Information     [facility], [facility] Follow up.   Specialty: Home Health Services Why: Someone will call you for resumption of care visit. Contact information: [address] [place] [address] [phone]                  The results of significant diagnostics from this hospitalization (including imaging, microbiology, ancillary and laboratory) are listed below for reference.    Significant Diagnostic Studies: DG Chest 2 View  Result Date: 08/11/2023 CLINICAL DATA:  Pneumonia. EXAM: CHEST - 2 VIEW COMPARISON:  Chest radiographs 08/07/2023, 08/06/2023, 08/04/2023, 07/12/2023; CT chest 08/01/2023 FINDINGS: The cardiac silhouette is again moderately enlarged. Mediastinal contours are within normal limits. Moderate to high-grade atherosclerotic calcifications are again seen within aortic arch. There is mild-to-moderate right and left diffuse interstitial thickening, similar to prior. Small bilateral pleural effusions appear similar to prior. Right greater than left basilar heterogeneous airspace opacities are similar to prior. No pneumothorax is seen. Mild-to-moderate multilevel degenerative disc changes of the thoracic spine. Left axillary surgical clips. IMPRESSION: 1. Moderate cardiomegaly with mild-to-moderate diffuse interstitial thickening, similar to prior. 2. Small bilateral pleural effusions with right greater than left basilar  heterogeneous airspace opacities, similar to prior. Again the basilar opacities may represent atelectasis and/or infection/aspiration. Electronically Signed   By: Neita Garnet M.D.   On: 08/11/2023 10:01   DG Chest Port 1 View  Result Date: 08/07/2023 CLINICAL DATA:  Fever EXAM: PORTABLE CHEST 1 VIEW COMPARISON:  Yesterday FINDINGS: The Chin overlies the left apex. Surgical clips in the left axilla. Midline trachea. Moderate cardiomegaly. Atherosclerosis in the transverse aorta. Small bilateral pleural effusions. No pneumothorax. Slightly improved right-sided aeration with moderate interstitial edema remaining. Persistent left and improved right basilar predominant airspace disease. IMPRESSION: Mild improvement in moderate congestive heart failure. Similar small bilateral pleural effusions with improved right and persistent left base airspace disease which could represent atelectasis or concurrent infection/aspiration. Aortic Atherosclerosis (ICD10-I70.0). Electronically Signed   By: Jeronimo Greaves M.D.   On:  08/07/2023 11:37   DG CHEST PORT 1 VIEW  Result Date: 08/06/2023 CLINICAL DATA:  Hypoxia. EXAM: PORTABLE CHEST 1 VIEW COMPARISON:  Radiograph 08/04/2023, CT 08/01/2023 FINDINGS: The heart is enlarged. Progressive interstitial thickening suspicious for pulmonary edema. Increase in bilateral pleural effusions and bibasilar airspace disease. Chain sutures in the left upper lobe. No pneumothorax. IMPRESSION: Cardiomegaly with progressive pulmonary edema. Increase in bibasilar airspace disease and pleural effusions. Electronically Signed   By: Narda Rutherford M.D.   On: 08/06/2023 16:01   DG CHEST PORT 1 VIEW  Result Date: 08/04/2023 CLINICAL DATA:  Hypoxia. EXAM: PORTABLE CHEST 1 VIEW COMPARISON:  07/31/2023.  Chest CTA dated 08/01/2023 FINDINGS: Borderline enlarged cardiac silhouette with an interval decrease in size. Poor inspiration. Diffusely prominent pulmonary vasculature, increased. Small amount  of mildly increased small amount of patchy opacity at both lung bases with interval small areas of mild patchy opacity in the right mid and upper lung zones. Mediastinal and left axillary surgical clips. Unremarkable bones. IMPRESSION: 1. Increased pulmonary vascular congestion. 2. Mildly increased mild patchy atelectasis or pneumonia at both lung bases with interval small areas of mild probable pneumonia in the right mid and upper lung zones. Electronically Signed   By: Beckie Salts M.D.   On: 08/04/2023 13:31   CT Angio Chest PE W and/or Wo Contrast  Result Date: 08/01/2023 CLINICAL DATA:  Shortness of breath and abdominal pain, initial encounter EXAM: CT ANGIOGRAPHY CHEST CT ABDOMEN AND PELVIS WITH CONTRAST TECHNIQUE: Multidetector CT imaging of the chest was performed using the standard protocol during bolus administration of intravenous contrast. Multiplanar CT image reconstructions and MIPs were obtained to evaluate the vascular anatomy. Multidetector CT imaging of the abdomen and pelvis was performed using the standard protocol during bolus administration of intravenous contrast. RADIATION DOSE REDUCTION: This exam was performed according to the departmental dose-optimization program which includes automated exposure control, adjustment of the mA and/or kV according to patient size and/or use of iterative reconstruction technique. CONTRAST:  75mL OMNIPAQUE IOHEXOL 350 MG/ML SOLN COMPARISON:  11/03/2022 FINDINGS: CTA CHEST FINDINGS Cardiovascular: Thoracic aorta demonstrates atherosclerotic calcifications. No aneurysmal dilatation is seen. Pulmonary artery shows a normal branching pattern bilaterally. The pulmonary artery shows no filling defect to suggest pulmonary embolism. Heart is enlarged in size. Heavy coronary calcifications are noted. Mediastinum/Nodes: Thoracic inlet is within normal limits. No hilar or mediastinal adenopathy is noted. The esophagus as visualized is within normal limits.  Lungs/Pleura: Some mucous plugging is noted in the right lower lobe posteriorly. Generalized emphysematous changes are noted throughout both lungs. Changes consistent with prior left upper lobe wedge resection are seen. Patchy somewhat nodular appearing density is noted in the residual left upper lobe measuring up to 16 mm. This is new from the prior exam. This may represent inflammatory change although given the clinical history of prior lung carcinoma the possibility of recurrent disease could not be totally excluded. Musculoskeletal: No chest wall abnormality. No acute or significant osseous findings. Review of the MIP images confirms the above findings. CT ABDOMEN and PELVIS FINDINGS Hepatobiliary: No focal liver abnormality is seen. No gallstones, gallbladder wall thickening, or biliary dilatation. Pancreas: Unremarkable. No pancreatic ductal dilatation or surrounding inflammatory changes. Spleen: Normal in size without focal abnormality. Adrenals/Urinary Tract: Adrenal glands are within normal limits. Kidneys demonstrate a normal enhancement pattern. No calculi are seen. No obstructive changes noted. The bladder is well distended. Stomach/Bowel: Scattered diverticular changes noted without evidence of diverticulitis. No obstructive or inflammatory changes are seen. Retained fecal  material is noted throughout the colon consistent with a degree of constipation. The appendix is within normal limits. Small bowel and stomach are unremarkable. Vascular/Lymphatic: Atherosclerotic calcifications of the abdominal aorta are noted. Somewhat bilobed fusiform dilatation of the aorta is seen. Maximum dimension is 5.1 cm. No dissection is seen. Mural thrombus is noted. No specific venous abnormality is noted. No lymphadenopathy is seen. Reproductive: Uterus and bilateral adnexa are unremarkable. Other: No abdominal wall hernia or abnormality. No abdominopelvic ascites. Musculoskeletal: Degenerative changes of the lumbar  spine are seen. Review of the MIP images confirms the above findings. IMPRESSION: CTA of the chest: No evidence of pulmonary embolism. Mucous plugging in the right lower lobe. 16 mm nodular density in the left upper lobe. Although this may be postinflammatory in nature. Given the patient's clinical history the possibility of recurrent neoplasm would deserve consideration. Short-term follow-up in 3 months is recommended. CT of the abdomen and pelvis: Abdominal aortic aneurysm measuring 5.1 cm. Recommend CTA or MRA, as appropriate, in 6 months and referral to a vascular specialist. Reference: Journal of Vascular Surgery 67.1 (2018): 2-77. J Am Coll Radiol [phone]. Diverticulosis without diverticulitis. Electronically Signed   By: Alcide Clever M.D.   On: 08/01/2023 01:52   CT ABDOMEN PELVIS W CONTRAST  Result Date: 08/01/2023 CLINICAL DATA:  Shortness of breath and abdominal pain, initial encounter EXAM: CT ANGIOGRAPHY CHEST CT ABDOMEN AND PELVIS WITH CONTRAST TECHNIQUE: Multidetector CT imaging of the chest was performed using the standard protocol during bolus administration of intravenous contrast. Multiplanar CT image reconstructions and MIPs were obtained to evaluate the vascular anatomy. Multidetector CT imaging of the abdomen and pelvis was performed using the standard protocol during bolus administration of intravenous contrast. RADIATION DOSE REDUCTION: This exam was performed according to the departmental dose-optimization program which includes automated exposure control, adjustment of the mA and/or kV according to patient size and/or use of iterative reconstruction technique. CONTRAST:  75mL OMNIPAQUE IOHEXOL 350 MG/ML SOLN COMPARISON:  11/03/2022 FINDINGS: CTA CHEST FINDINGS Cardiovascular: Thoracic aorta demonstrates atherosclerotic calcifications. No aneurysmal dilatation is seen. Pulmonary artery shows a normal branching pattern bilaterally. The pulmonary artery shows no filling defect to  suggest pulmonary embolism. Heart is enlarged in size. Heavy coronary calcifications are noted. Mediastinum/Nodes: Thoracic inlet is within normal limits. No hilar or mediastinal adenopathy is noted. The esophagus as visualized is within normal limits. Lungs/Pleura: Some mucous plugging is noted in the right lower lobe posteriorly. Generalized emphysematous changes are noted throughout both lungs. Changes consistent with prior left upper lobe wedge resection are seen. Patchy somewhat nodular appearing density is noted in the residual left upper lobe measuring up to 16 mm. This is new from the prior exam. This may represent inflammatory change although given the clinical history of prior lung carcinoma the possibility of recurrent disease could not be totally excluded. Musculoskeletal: No chest wall abnormality. No acute or significant osseous findings. Review of the MIP images confirms the above findings. CT ABDOMEN and PELVIS FINDINGS Hepatobiliary: No focal liver abnormality is seen. No gallstones, gallbladder wall thickening, or biliary dilatation. Pancreas: Unremarkable. No pancreatic ductal dilatation or surrounding inflammatory changes. Spleen: Normal in size without focal abnormality. Adrenals/Urinary Tract: Adrenal glands are within normal limits. Kidneys demonstrate a normal enhancement pattern. No calculi are seen. No obstructive changes noted. The bladder is well distended. Stomach/Bowel: Scattered diverticular changes noted without evidence of diverticulitis. No obstructive or inflammatory changes are seen. Retained fecal material is noted throughout the colon consistent with a degree of  constipation. The appendix is within normal limits. Small bowel and stomach are unremarkable. Vascular/Lymphatic: Atherosclerotic calcifications of the abdominal aorta are noted. Somewhat bilobed fusiform dilatation of the aorta is seen. Maximum dimension is 5.1 cm. No dissection is seen. Mural thrombus is noted. No  specific venous abnormality is noted. No lymphadenopathy is seen. Reproductive: Uterus and bilateral adnexa are unremarkable. Other: No abdominal wall hernia or abnormality. No abdominopelvic ascites. Musculoskeletal: Degenerative changes of the lumbar spine are seen. Review of the MIP images confirms the above findings. IMPRESSION: CTA of the chest: No evidence of pulmonary embolism. Mucous plugging in the right lower lobe. 16 mm nodular density in the left upper lobe. Although this may be postinflammatory in nature. Given the patient's clinical history the possibility of recurrent neoplasm would deserve consideration. Short-term follow-up in 3 months is recommended. CT of the abdomen and pelvis: Abdominal aortic aneurysm measuring 5.1 cm. Recommend CTA or MRA, as appropriate, in 6 months and referral to a vascular specialist. Reference: Journal of Vascular Surgery 67.1 (2018): 2-77. J Am Coll Radiol [phone]. Diverticulosis without diverticulitis. Electronically Signed   By: Alcide Clever M.D.   On: 08/01/2023 01:52   DG Chest 2 View  Result Date: 07/31/2023 CLINICAL DATA:  Shortness of breath. EXAM: CHEST - 2 VIEW COMPARISON:  Chest radiograph dated 07/12/2023. FINDINGS: Left lung base atelectasis or infiltrate. Significant improvement in the bilateral pulmonary opacities compared to prior radiograph. No pleural effusion or pneumothorax. Stable cardiac silhouette. No acute osseous pathology. IMPRESSION: Significant improvement in bibasilar opacities. Residual left lung base density may represent atelectasis or infiltrate. Electronically Signed   By: Elgie Collard M.D.   On: 07/31/2023 23:11    Microbiology: Recent Results (from the past 240 hour(s))  Culture, blood (Routine X 2) w Reflex to ID Panel     Status: None   Collection Time: 08/07/23  6:41 AM   Specimen: BLOOD RIGHT HAND  Result Value Ref Range Status   Specimen Description BLOOD RIGHT HAND  Final   Special Requests   Final     BOTTLES DRAWN AEROBIC AND ANAEROBIC Blood Culture results may not be optimal due to an excessive volume of blood received in culture bottles   Culture   Final    NO GROWTH 5 DAYS Performed at [facility], [address] [place], [place] [address]    Report Status 08/12/2023 FINAL  Final  Culture, blood (Routine X 2) w Reflex to ID Panel     Status: None   Collection Time: 08/07/23  6:42 AM   Specimen: BLOOD RIGHT HAND  Result Value Ref Range Status   Specimen Description BLOOD RIGHT HAND  Final   Special Requests   Final    BOTTLES DRAWN AEROBIC AND ANAEROBIC Blood Culture adequate volume   Culture   Final    NO GROWTH 5 DAYS Performed at [facility], [address], [place], [place] [address]    Report Status 08/12/2023 FINAL  Final     Labs: Basic Metabolic Panel: Recent Labs  Lab 08/11/23 0342 08/12/23 0957 08/13/23 0544 08/14/23 0435 08/15/23 0356  NA 139 135 136 135 133*  K 3.7 3.5 3.6 3.8 3.7  CL 88* 89* 88* 89* 89*  CO2 36* 36* 36* 36* 35*  GLUCOSE 84 99 80 85 84  BUN 21 16 19 17 15   CREATININE 1.18* 1.03* 1.12* 0.89 0.87  CALCIUM 9.3 9.1 9.0 9.4 9.4   Liver Function Tests: Recent Labs  Lab  08/13/23 0544 08/14/23 0435 08/15/23 0356  AST 30 33 29  ALT 13 12 12   ALKPHOS 39 38 38  BILITOT 0.9 0.8 0.8  PROT 7.5 7.5 8.2*  ALBUMIN 2.4* 2.4* 2.7*   No results for input(s): "LIPASE", "AMYLASE" in the last 168 hours. No results for input(s): "AMMONIA" in the last 168 hours. CBC: Recent Labs  Lab 08/10/23 0728 08/11/23 0342 08/13/23 0544 08/14/23 0435 08/15/23 0356  WBC 6.3 5.6 3.9* 3.5* 3.9*  NEUTROABS  --   --  0.5* 0.5* 0.7*  HGB 8.0* 7.7* 8.0* 7.3* 7.5*  HCT 28.0* 25.9* 27.8* 25.5* 25.9*  MCV 104.5* 100.0 104.5* 101.2* 102.0*  PLT 75* 66* 65* 69* 73*   Cardiac Enzymes: No results for input(s): "CKTOTAL", "CKMB", "CKMBINDEX", "TROPONINI" in the last 168 hours. BNP: BNP (last 3 results) Recent Labs    07/12/23 0820 07/31/23 2345  08/06/23 1848  BNP 571.0* 1,637.4* 353.3*    ProBNP (last 3 results) No results for input(s): "PROBNP" in the last 8760 hours.  CBG: Recent Labs  Lab 08/12/23 0540 08/13/23 0535 08/14/23 0634 08/15/23 0610 08/16/23 0556  GLUCAP 82 83 86 82 86       Signed:  Rhetta Mura MD   [facility] 08/16/2023, 9:31 AM

## 2023-08-16 NOTE — TOC Transition Note (Signed)
Transition of Care (TOC) - CM/SW Discharge Note Donn Pierini RN, BSN Transitions of Care Unit 4E- RN Case Manager See Treatment Team for direct phone #   Patient Details  Name: Lisa [name] MRN: [id number] Date of Birth: [birth year]  Transition of Care [facility]) CM/SW Contact:  Darrold Span, RN Phone Number: 08/16/2023, 11:37 AM   Clinical Narrative:    [facility] liaison has notified CM this am that pt has bed available for admit to [facility] today.  Pt is medically stable for transition to [facility] per MD.   CM spoke with pt at bedside to let her know about bed at [facility]- pt is agreeable and accepts bed offer, Request that her son/daughter/grandson be notified.  Call made to daughter to let her know about [facility] bed, daughter to let pt's son know as welll.  Call made to pt's grandson/POA- informed of [facility] bed- grandson is agreeable and request that copy of AVS be provided to pt/family prior to transition to [facility]- bedside RN made aware of request.   Pt will transport via EMS- [facility] called for transport- per dispatch pt should be pickup for transport within the next 30-45 min. Paperwork placed on chart.   Per [facility] Liaison- receiving MD - Dr. Filbert Schilder  Nursing # for report to [phone]  [facility] liaison updated that pt going to [facility] prior to return home and will follow for any further [facility] needs.  No further TOC needs at this time.    Final next level of care: IP Rehab Facility Barriers to Discharge: Barriers Resolved   Patient Goals and CMS Choice CMS Medicare.gov Compare Post Acute Care list provided to:: Patient Choice offered to / list presented to : [facility] POA / Guardian, Patient, Adult Children  Discharge Placement                 [facility]        Discharge Plan and Services Additional resources added to the After Visit Summary for   In-house Referral: Clinical Social Work              DME Arranged: N/A DME Agency: NA         HH Agency:  [facility]        Social Determinants of Health (SDOH) Interventions SDOH Screenings   Food Insecurity: No Food Insecurity (08/03/2023)  Housing: Low Risk  (08/03/2023)  Transportation Needs: No Transportation Needs (08/03/2023)  Utilities: Not At Risk (08/03/2023)  Depression (PHQ2-9): Low Risk  (12/12/2020)  Physical Activity: Inactive (12/12/2020)  Tobacco Use: Medium Risk (08/01/2023)     Readmission Risk Interventions    08/16/2023   11:37 AM 08/03/2023   11:15 AM 07/13/2023   11:15 AM  Readmission Risk Prevention Plan  Transportation Screening Complete Complete Complete  Medication Review [facility])  Referral to Pharmacy Complete  PCP or Specialist appointment within 3-5 days of discharge  Complete   HRI or Home Care Consult  Complete Complete  SW Recovery Care/Counseling Consult  Complete Complete  Palliative Care Screening  Not Applicable Not Applicable  Skilled Nursing Facility  Not Applicable Not Applicable

## 2023-08-16 NOTE — Progress Notes (Addendum)
1210 Patient dressed in home clothing foley remains in tact patient alertx4 on 4L [place] taken on stretcher to [facility] family at bedside will follow

## 2023-08-16 NOTE — Plan of Care (Signed)

## 2023-08-17 DIAGNOSIS — R2689 Other abnormalities of gait and mobility: Secondary | ICD-10-CM | POA: Diagnosis not present

## 2023-08-17 DIAGNOSIS — I4811 Longstanding persistent atrial fibrillation: Secondary | ICD-10-CM | POA: Diagnosis not present

## 2023-08-17 DIAGNOSIS — K219 Gastro-esophageal reflux disease without esophagitis: Secondary | ICD-10-CM | POA: Diagnosis not present

## 2023-08-17 DIAGNOSIS — I10 Essential (primary) hypertension: Secondary | ICD-10-CM | POA: Diagnosis not present

## 2023-08-18 DIAGNOSIS — R2689 Other abnormalities of gait and mobility: Secondary | ICD-10-CM | POA: Diagnosis not present

## 2023-08-18 DIAGNOSIS — K219 Gastro-esophageal reflux disease without esophagitis: Secondary | ICD-10-CM | POA: Diagnosis not present

## 2023-08-18 DIAGNOSIS — I10 Essential (primary) hypertension: Secondary | ICD-10-CM | POA: Diagnosis not present

## 2023-08-18 DIAGNOSIS — I4811 Longstanding persistent atrial fibrillation: Secondary | ICD-10-CM | POA: Diagnosis not present

## 2023-08-19 DIAGNOSIS — I10 Essential (primary) hypertension: Secondary | ICD-10-CM | POA: Diagnosis not present

## 2023-08-19 DIAGNOSIS — R2689 Other abnormalities of gait and mobility: Secondary | ICD-10-CM | POA: Diagnosis not present

## 2023-08-19 DIAGNOSIS — K219 Gastro-esophageal reflux disease without esophagitis: Secondary | ICD-10-CM | POA: Diagnosis not present

## 2023-08-19 DIAGNOSIS — I4811 Longstanding persistent atrial fibrillation: Secondary | ICD-10-CM | POA: Diagnosis not present

## 2023-08-20 DIAGNOSIS — I4811 Longstanding persistent atrial fibrillation: Secondary | ICD-10-CM | POA: Diagnosis not present

## 2023-08-20 DIAGNOSIS — R2689 Other abnormalities of gait and mobility: Secondary | ICD-10-CM | POA: Diagnosis not present

## 2023-08-20 DIAGNOSIS — K219 Gastro-esophageal reflux disease without esophagitis: Secondary | ICD-10-CM | POA: Diagnosis not present

## 2023-08-20 DIAGNOSIS — I10 Essential (primary) hypertension: Secondary | ICD-10-CM | POA: Diagnosis not present

## 2023-08-21 DIAGNOSIS — K219 Gastro-esophageal reflux disease without esophagitis: Secondary | ICD-10-CM | POA: Diagnosis not present

## 2023-08-21 DIAGNOSIS — R2689 Other abnormalities of gait and mobility: Secondary | ICD-10-CM | POA: Diagnosis not present

## 2023-08-21 DIAGNOSIS — I10 Essential (primary) hypertension: Secondary | ICD-10-CM | POA: Diagnosis not present

## 2023-08-21 DIAGNOSIS — I4811 Longstanding persistent atrial fibrillation: Secondary | ICD-10-CM | POA: Diagnosis not present

## 2023-08-26 DIAGNOSIS — D469 Myelodysplastic syndrome, unspecified: Secondary | ICD-10-CM | POA: Diagnosis not present

## 2023-08-26 DIAGNOSIS — I5032 Chronic diastolic (congestive) heart failure: Secondary | ICD-10-CM | POA: Diagnosis not present

## 2023-08-26 DIAGNOSIS — I272 Pulmonary hypertension, unspecified: Secondary | ICD-10-CM | POA: Diagnosis not present

## 2023-08-26 DIAGNOSIS — I517 Cardiomegaly: Secondary | ICD-10-CM | POA: Diagnosis not present

## 2023-08-26 DIAGNOSIS — J690 Pneumonitis due to inhalation of food and vomit: Secondary | ICD-10-CM | POA: Diagnosis not present

## 2023-08-26 DIAGNOSIS — N390 Urinary tract infection, site not specified: Secondary | ICD-10-CM | POA: Diagnosis not present

## 2023-08-26 DIAGNOSIS — I5030 Unspecified diastolic (congestive) heart failure: Secondary | ICD-10-CM | POA: Diagnosis not present

## 2023-08-26 DIAGNOSIS — R5081 Fever presenting with conditions classified elsewhere: Secondary | ICD-10-CM | POA: Diagnosis not present

## 2023-08-26 DIAGNOSIS — I714 Abdominal aortic aneurysm, without rupture, unspecified: Secondary | ICD-10-CM | POA: Diagnosis not present

## 2023-08-26 DIAGNOSIS — Z66 Do not resuscitate: Secondary | ICD-10-CM | POA: Diagnosis not present

## 2023-08-26 DIAGNOSIS — J189 Pneumonia, unspecified organism: Secondary | ICD-10-CM | POA: Diagnosis not present

## 2023-08-26 DIAGNOSIS — I350 Nonrheumatic aortic (valve) stenosis: Secondary | ICD-10-CM | POA: Diagnosis not present

## 2023-08-26 DIAGNOSIS — I480 Paroxysmal atrial fibrillation: Secondary | ICD-10-CM | POA: Diagnosis not present

## 2023-08-26 DIAGNOSIS — R627 Adult failure to thrive: Secondary | ICD-10-CM | POA: Diagnosis not present

## 2023-08-26 DIAGNOSIS — Z902 Acquired absence of lung [part of]: Secondary | ICD-10-CM | POA: Diagnosis not present

## 2023-08-26 DIAGNOSIS — J811 Chronic pulmonary edema: Secondary | ICD-10-CM | POA: Diagnosis not present

## 2023-08-26 DIAGNOSIS — Z87440 Personal history of urinary (tract) infections: Secondary | ICD-10-CM | POA: Diagnosis not present

## 2023-08-26 DIAGNOSIS — I110 Hypertensive heart disease with heart failure: Secondary | ICD-10-CM | POA: Diagnosis not present

## 2023-08-26 DIAGNOSIS — R4182 Altered mental status, unspecified: Secondary | ICD-10-CM | POA: Diagnosis not present

## 2023-08-26 DIAGNOSIS — N3000 Acute cystitis without hematuria: Secondary | ICD-10-CM | POA: Diagnosis not present

## 2023-08-26 DIAGNOSIS — I509 Heart failure, unspecified: Secondary | ICD-10-CM | POA: Diagnosis not present

## 2023-08-26 DIAGNOSIS — E039 Hypothyroidism, unspecified: Secondary | ICD-10-CM | POA: Diagnosis not present

## 2023-08-26 DIAGNOSIS — Z853 Personal history of malignant neoplasm of breast: Secondary | ICD-10-CM | POA: Diagnosis not present

## 2023-08-26 DIAGNOSIS — J9621 Acute and chronic respiratory failure with hypoxia: Secondary | ICD-10-CM | POA: Diagnosis not present

## 2023-08-26 DIAGNOSIS — A419 Sepsis, unspecified organism: Secondary | ICD-10-CM | POA: Diagnosis not present

## 2023-08-26 DIAGNOSIS — R9082 White matter disease, unspecified: Secondary | ICD-10-CM | POA: Diagnosis not present

## 2023-08-26 DIAGNOSIS — R0600 Dyspnea, unspecified: Secondary | ICD-10-CM | POA: Diagnosis not present

## 2023-08-26 DIAGNOSIS — R6521 Severe sepsis with septic shock: Secondary | ICD-10-CM | POA: Diagnosis not present

## 2023-08-26 DIAGNOSIS — D696 Thrombocytopenia, unspecified: Secondary | ICD-10-CM | POA: Diagnosis not present

## 2023-08-26 DIAGNOSIS — Z79890 Hormone replacement therapy (postmenopausal): Secondary | ICD-10-CM | POA: Diagnosis not present

## 2023-08-26 DIAGNOSIS — Z7982 Long term (current) use of aspirin: Secondary | ICD-10-CM | POA: Diagnosis not present

## 2023-08-26 DIAGNOSIS — J90 Pleural effusion, not elsewhere classified: Secondary | ICD-10-CM | POA: Diagnosis not present

## 2023-08-26 DIAGNOSIS — R6520 Severe sepsis without septic shock: Secondary | ICD-10-CM | POA: Diagnosis not present

## 2023-08-26 DIAGNOSIS — I083 Combined rheumatic disorders of mitral, aortic and tricuspid valves: Secondary | ICD-10-CM | POA: Diagnosis not present

## 2023-08-26 DIAGNOSIS — E222 Syndrome of inappropriate secretion of antidiuretic hormone: Secondary | ICD-10-CM | POA: Diagnosis not present

## 2023-08-26 DIAGNOSIS — R918 Other nonspecific abnormal finding of lung field: Secondary | ICD-10-CM | POA: Diagnosis not present

## 2023-08-26 DIAGNOSIS — J449 Chronic obstructive pulmonary disease, unspecified: Secondary | ICD-10-CM | POA: Diagnosis not present

## 2023-08-26 DIAGNOSIS — Z8679 Personal history of other diseases of the circulatory system: Secondary | ICD-10-CM | POA: Diagnosis not present

## 2023-08-26 DIAGNOSIS — B999 Unspecified infectious disease: Secondary | ICD-10-CM | POA: Diagnosis not present

## 2023-08-28 DIAGNOSIS — I5030 Unspecified diastolic (congestive) heart failure: Secondary | ICD-10-CM | POA: Insufficient documentation

## 2023-08-28 DIAGNOSIS — J449 Chronic obstructive pulmonary disease, unspecified: Secondary | ICD-10-CM | POA: Insufficient documentation

## 2023-08-28 DIAGNOSIS — I350 Nonrheumatic aortic (valve) stenosis: Secondary | ICD-10-CM | POA: Insufficient documentation

## 2023-08-31 DIAGNOSIS — R6521 Severe sepsis with septic shock: Secondary | ICD-10-CM | POA: Diagnosis not present

## 2023-08-31 DIAGNOSIS — N390 Urinary tract infection, site not specified: Secondary | ICD-10-CM | POA: Diagnosis not present

## 2023-08-31 DIAGNOSIS — I959 Hypotension, unspecified: Secondary | ICD-10-CM | POA: Diagnosis not present

## 2023-08-31 DIAGNOSIS — Z9981 Dependence on supplemental oxygen: Secondary | ICD-10-CM | POA: Diagnosis not present

## 2023-08-31 DIAGNOSIS — I714 Abdominal aortic aneurysm, without rupture, unspecified: Secondary | ICD-10-CM | POA: Diagnosis not present

## 2023-08-31 DIAGNOSIS — K219 Gastro-esophageal reflux disease without esophagitis: Secondary | ICD-10-CM | POA: Diagnosis not present

## 2023-08-31 DIAGNOSIS — I130 Hypertensive heart and chronic kidney disease with heart failure and stage 1 through stage 4 chronic kidney disease, or unspecified chronic kidney disease: Secondary | ICD-10-CM | POA: Diagnosis not present

## 2023-08-31 DIAGNOSIS — G7281 Critical illness myopathy: Secondary | ICD-10-CM | POA: Diagnosis not present

## 2023-08-31 DIAGNOSIS — I480 Paroxysmal atrial fibrillation: Secondary | ICD-10-CM | POA: Diagnosis not present

## 2023-08-31 DIAGNOSIS — I5032 Chronic diastolic (congestive) heart failure: Secondary | ICD-10-CM | POA: Diagnosis not present

## 2023-08-31 DIAGNOSIS — J849 Interstitial pulmonary disease, unspecified: Secondary | ICD-10-CM | POA: Diagnosis not present

## 2023-08-31 DIAGNOSIS — E039 Hypothyroidism, unspecified: Secondary | ICD-10-CM | POA: Diagnosis not present

## 2023-08-31 DIAGNOSIS — Z743 Need for continuous supervision: Secondary | ICD-10-CM | POA: Diagnosis not present

## 2023-08-31 DIAGNOSIS — J90 Pleural effusion, not elsewhere classified: Secondary | ICD-10-CM | POA: Diagnosis not present

## 2023-08-31 DIAGNOSIS — I350 Nonrheumatic aortic (valve) stenosis: Secondary | ICD-10-CM | POA: Diagnosis not present

## 2023-08-31 DIAGNOSIS — I4811 Longstanding persistent atrial fibrillation: Secondary | ICD-10-CM | POA: Diagnosis not present

## 2023-08-31 DIAGNOSIS — N183 Chronic kidney disease, stage 3 unspecified: Secondary | ICD-10-CM | POA: Diagnosis not present

## 2023-08-31 DIAGNOSIS — A419 Sepsis, unspecified organism: Secondary | ICD-10-CM | POA: Diagnosis not present

## 2023-08-31 DIAGNOSIS — N3001 Acute cystitis with hematuria: Secondary | ICD-10-CM | POA: Diagnosis not present

## 2023-08-31 DIAGNOSIS — G9341 Metabolic encephalopathy: Secondary | ICD-10-CM | POA: Diagnosis not present

## 2023-08-31 DIAGNOSIS — M25561 Pain in right knee: Secondary | ICD-10-CM | POA: Diagnosis not present

## 2023-08-31 DIAGNOSIS — F418 Other specified anxiety disorders: Secondary | ICD-10-CM | POA: Diagnosis not present

## 2023-08-31 DIAGNOSIS — D469 Myelodysplastic syndrome, unspecified: Secondary | ICD-10-CM | POA: Diagnosis not present

## 2023-08-31 DIAGNOSIS — R911 Solitary pulmonary nodule: Secondary | ICD-10-CM | POA: Diagnosis not present

## 2023-08-31 DIAGNOSIS — D709 Neutropenia, unspecified: Secondary | ICD-10-CM | POA: Diagnosis not present

## 2023-08-31 DIAGNOSIS — J189 Pneumonia, unspecified organism: Secondary | ICD-10-CM | POA: Diagnosis not present

## 2023-08-31 DIAGNOSIS — J449 Chronic obstructive pulmonary disease, unspecified: Secondary | ICD-10-CM | POA: Diagnosis not present

## 2023-08-31 DIAGNOSIS — R0989 Other specified symptoms and signs involving the circulatory and respiratory systems: Secondary | ICD-10-CM | POA: Diagnosis not present

## 2023-08-31 DIAGNOSIS — Z853 Personal history of malignant neoplasm of breast: Secondary | ICD-10-CM | POA: Diagnosis not present

## 2023-08-31 DIAGNOSIS — D696 Thrombocytopenia, unspecified: Secondary | ICD-10-CM | POA: Diagnosis not present

## 2023-08-31 DIAGNOSIS — N1831 Chronic kidney disease, stage 3a: Secondary | ICD-10-CM | POA: Diagnosis not present

## 2023-08-31 DIAGNOSIS — R0689 Other abnormalities of breathing: Secondary | ICD-10-CM | POA: Diagnosis not present

## 2023-08-31 DIAGNOSIS — R2689 Other abnormalities of gait and mobility: Secondary | ICD-10-CM | POA: Diagnosis not present

## 2023-08-31 DIAGNOSIS — E871 Hypo-osmolality and hyponatremia: Secondary | ICD-10-CM | POA: Diagnosis not present

## 2023-08-31 DIAGNOSIS — I10 Essential (primary) hypertension: Secondary | ICD-10-CM | POA: Diagnosis not present

## 2023-08-31 DIAGNOSIS — M25461 Effusion, right knee: Secondary | ICD-10-CM | POA: Diagnosis not present

## 2023-09-07 ENCOUNTER — Inpatient Hospital Stay: Payer: Medicare Other

## 2023-09-07 ENCOUNTER — Ambulatory Visit: Payer: Medicare Other

## 2023-09-07 ENCOUNTER — Other Ambulatory Visit: Payer: Medicare Other

## 2023-09-19 DIAGNOSIS — Z9181 History of falling: Secondary | ICD-10-CM | POA: Diagnosis not present

## 2023-09-19 DIAGNOSIS — I714 Abdominal aortic aneurysm, without rupture, unspecified: Secondary | ICD-10-CM | POA: Diagnosis not present

## 2023-09-19 DIAGNOSIS — E039 Hypothyroidism, unspecified: Secondary | ICD-10-CM | POA: Diagnosis not present

## 2023-09-19 DIAGNOSIS — J449 Chronic obstructive pulmonary disease, unspecified: Secondary | ICD-10-CM | POA: Diagnosis not present

## 2023-09-19 DIAGNOSIS — I480 Paroxysmal atrial fibrillation: Secondary | ICD-10-CM | POA: Diagnosis not present

## 2023-09-19 DIAGNOSIS — I130 Hypertensive heart and chronic kidney disease with heart failure and stage 1 through stage 4 chronic kidney disease, or unspecified chronic kidney disease: Secondary | ICD-10-CM | POA: Diagnosis not present

## 2023-09-19 DIAGNOSIS — Z7982 Long term (current) use of aspirin: Secondary | ICD-10-CM | POA: Diagnosis not present

## 2023-09-19 DIAGNOSIS — G7281 Critical illness myopathy: Secondary | ICD-10-CM | POA: Diagnosis not present

## 2023-09-19 DIAGNOSIS — Z9981 Dependence on supplemental oxygen: Secondary | ICD-10-CM | POA: Diagnosis not present

## 2023-09-19 DIAGNOSIS — Z8701 Personal history of pneumonia (recurrent): Secondary | ICD-10-CM | POA: Diagnosis not present

## 2023-09-19 DIAGNOSIS — Z7951 Long term (current) use of inhaled steroids: Secondary | ICD-10-CM | POA: Diagnosis not present

## 2023-09-19 DIAGNOSIS — E785 Hyperlipidemia, unspecified: Secondary | ICD-10-CM | POA: Diagnosis not present

## 2023-09-19 DIAGNOSIS — N183 Chronic kidney disease, stage 3 unspecified: Secondary | ICD-10-CM | POA: Diagnosis not present

## 2023-09-19 DIAGNOSIS — I509 Heart failure, unspecified: Secondary | ICD-10-CM | POA: Diagnosis not present

## 2023-09-19 DIAGNOSIS — D469 Myelodysplastic syndrome, unspecified: Secondary | ICD-10-CM | POA: Diagnosis not present

## 2023-09-19 DIAGNOSIS — R911 Solitary pulmonary nodule: Secondary | ICD-10-CM | POA: Diagnosis not present

## 2023-09-21 DIAGNOSIS — D469 Myelodysplastic syndrome, unspecified: Secondary | ICD-10-CM | POA: Diagnosis not present

## 2023-09-21 DIAGNOSIS — I252 Old myocardial infarction: Secondary | ICD-10-CM | POA: Diagnosis not present

## 2023-09-21 DIAGNOSIS — M6281 Muscle weakness (generalized): Secondary | ICD-10-CM | POA: Diagnosis not present

## 2023-09-21 DIAGNOSIS — J441 Chronic obstructive pulmonary disease with (acute) exacerbation: Secondary | ICD-10-CM | POA: Diagnosis not present

## 2023-09-21 DIAGNOSIS — I480 Paroxysmal atrial fibrillation: Secondary | ICD-10-CM | POA: Diagnosis not present

## 2023-09-21 DIAGNOSIS — I110 Hypertensive heart disease with heart failure: Secondary | ICD-10-CM | POA: Diagnosis not present

## 2023-09-21 DIAGNOSIS — E875 Hyperkalemia: Secondary | ICD-10-CM | POA: Diagnosis not present

## 2023-09-21 DIAGNOSIS — J9610 Chronic respiratory failure, unspecified whether with hypoxia or hypercapnia: Secondary | ICD-10-CM | POA: Diagnosis not present

## 2023-09-21 DIAGNOSIS — Z8701 Personal history of pneumonia (recurrent): Secondary | ICD-10-CM | POA: Diagnosis not present

## 2023-09-21 DIAGNOSIS — J449 Chronic obstructive pulmonary disease, unspecified: Secondary | ICD-10-CM | POA: Diagnosis not present

## 2023-09-21 DIAGNOSIS — G4700 Insomnia, unspecified: Secondary | ICD-10-CM | POA: Diagnosis not present

## 2023-09-21 DIAGNOSIS — I5022 Chronic systolic (congestive) heart failure: Secondary | ICD-10-CM | POA: Diagnosis not present

## 2023-09-24 DIAGNOSIS — D469 Myelodysplastic syndrome, unspecified: Secondary | ICD-10-CM | POA: Diagnosis not present

## 2023-09-24 DIAGNOSIS — G7281 Critical illness myopathy: Secondary | ICD-10-CM | POA: Diagnosis not present

## 2023-09-24 DIAGNOSIS — J449 Chronic obstructive pulmonary disease, unspecified: Secondary | ICD-10-CM | POA: Diagnosis not present

## 2023-09-24 DIAGNOSIS — I509 Heart failure, unspecified: Secondary | ICD-10-CM | POA: Diagnosis not present

## 2023-09-24 DIAGNOSIS — I130 Hypertensive heart and chronic kidney disease with heart failure and stage 1 through stage 4 chronic kidney disease, or unspecified chronic kidney disease: Secondary | ICD-10-CM | POA: Diagnosis not present

## 2023-09-24 DIAGNOSIS — N183 Chronic kidney disease, stage 3 unspecified: Secondary | ICD-10-CM | POA: Diagnosis not present

## 2023-09-27 DIAGNOSIS — J449 Chronic obstructive pulmonary disease, unspecified: Secondary | ICD-10-CM | POA: Diagnosis not present

## 2023-09-27 DIAGNOSIS — N182 Chronic kidney disease, stage 2 (mild): Secondary | ICD-10-CM | POA: Diagnosis not present

## 2023-09-27 DIAGNOSIS — I130 Hypertensive heart and chronic kidney disease with heart failure and stage 1 through stage 4 chronic kidney disease, or unspecified chronic kidney disease: Secondary | ICD-10-CM | POA: Diagnosis not present

## 2023-09-27 DIAGNOSIS — F02818 Dementia in other diseases classified elsewhere, unspecified severity, with other behavioral disturbance: Secondary | ICD-10-CM | POA: Diagnosis not present

## 2023-09-27 DIAGNOSIS — R0602 Shortness of breath: Secondary | ICD-10-CM | POA: Diagnosis not present

## 2023-09-27 DIAGNOSIS — I4891 Unspecified atrial fibrillation: Secondary | ICD-10-CM | POA: Diagnosis not present

## 2023-09-27 DIAGNOSIS — N189 Chronic kidney disease, unspecified: Secondary | ICD-10-CM | POA: Diagnosis not present

## 2023-09-27 DIAGNOSIS — N183 Chronic kidney disease, stage 3 unspecified: Secondary | ICD-10-CM | POA: Diagnosis not present

## 2023-09-27 DIAGNOSIS — D469 Myelodysplastic syndrome, unspecified: Secondary | ICD-10-CM | POA: Diagnosis not present

## 2023-09-27 DIAGNOSIS — I509 Heart failure, unspecified: Secondary | ICD-10-CM | POA: Diagnosis not present

## 2023-09-27 DIAGNOSIS — G4700 Insomnia, unspecified: Secondary | ICD-10-CM | POA: Diagnosis not present

## 2023-09-27 DIAGNOSIS — R809 Proteinuria, unspecified: Secondary | ICD-10-CM | POA: Diagnosis not present

## 2023-09-27 DIAGNOSIS — I5032 Chronic diastolic (congestive) heart failure: Secondary | ICD-10-CM | POA: Diagnosis not present

## 2023-09-27 DIAGNOSIS — R531 Weakness: Secondary | ICD-10-CM | POA: Diagnosis not present

## 2023-09-27 DIAGNOSIS — R451 Restlessness and agitation: Secondary | ICD-10-CM | POA: Diagnosis not present

## 2023-09-27 DIAGNOSIS — G7281 Critical illness myopathy: Secondary | ICD-10-CM | POA: Diagnosis not present

## 2023-09-27 DIAGNOSIS — E559 Vitamin D deficiency, unspecified: Secondary | ICD-10-CM | POA: Diagnosis not present

## 2023-09-27 DIAGNOSIS — F419 Anxiety disorder, unspecified: Secondary | ICD-10-CM | POA: Diagnosis not present

## 2023-09-29 DIAGNOSIS — I2510 Atherosclerotic heart disease of native coronary artery without angina pectoris: Secondary | ICD-10-CM | POA: Diagnosis not present

## 2023-09-29 DIAGNOSIS — I714 Abdominal aortic aneurysm, without rupture, unspecified: Secondary | ICD-10-CM | POA: Diagnosis not present

## 2023-09-29 DIAGNOSIS — I129 Hypertensive chronic kidney disease with stage 1 through stage 4 chronic kidney disease, or unspecified chronic kidney disease: Secondary | ICD-10-CM | POA: Diagnosis not present

## 2023-09-29 DIAGNOSIS — E871 Hypo-osmolality and hyponatremia: Secondary | ICD-10-CM | POA: Diagnosis not present

## 2023-10-04 DIAGNOSIS — D469 Myelodysplastic syndrome, unspecified: Secondary | ICD-10-CM | POA: Diagnosis not present

## 2023-10-04 DIAGNOSIS — N183 Chronic kidney disease, stage 3 unspecified: Secondary | ICD-10-CM | POA: Diagnosis not present

## 2023-10-04 DIAGNOSIS — G7281 Critical illness myopathy: Secondary | ICD-10-CM | POA: Diagnosis not present

## 2023-10-04 DIAGNOSIS — J449 Chronic obstructive pulmonary disease, unspecified: Secondary | ICD-10-CM | POA: Diagnosis not present

## 2023-10-04 DIAGNOSIS — I130 Hypertensive heart and chronic kidney disease with heart failure and stage 1 through stage 4 chronic kidney disease, or unspecified chronic kidney disease: Secondary | ICD-10-CM | POA: Diagnosis not present

## 2023-10-04 DIAGNOSIS — I509 Heart failure, unspecified: Secondary | ICD-10-CM | POA: Diagnosis not present

## 2023-10-04 NOTE — Progress Notes (Incomplete)
[facility] [address] [place] [address]    Clinic Day:  10/04/23   Referring physician: [name], MD  Patient Care Team: [name], MD as PCP - General (Internal Medicine) Mallipeddi, Orion Modest, MD as PCP - Cardiology (Cardiology) Jena Gauss Gerrit Friends, MD as Consulting Physician (Gastroenterology)   ASSESSMENT & PLAN:   Assessment: 1.    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 Margo Aye for pancytopenia. Subsequently her white count has recovered along with recovery of the hemoglobin. -Bone marrow biopsy on 11/06/2020 shows hypercellular marrow with trilineage dyspoiesis and polytypic plasmacytosis.  Primary differential includes CMML and other MDS.  Definitive blast population is not identified morphologically or by flow.  There is a increased immature mononuclear cells favored to be monocytic in origin.  Megakaryocytes are frequently hypolobated and hyperchromatic without clustering.  Atypical appearing erythroid precursors.  Increased plasma cells 5 to 9% which are polytypic by kappa and lambda staining. - Chromosome analysis shows 46,X, idiocentric X chromosome[9]/46, XX[11]. -MDS FISH panel was negative. -Revised IPSS score of 3.  In the absence of treatment, median survival of 5.3 years and 25% AML progression is 10.8 years. -SPEP, methylmalonic acid, ferritin levels were normal. - Serum copper level is 87. - NGS (11/19/2020): SRSF2, TET2 - BMBX (04/29/2023): Hypercellular marrow (80%) with persistent involvement by myeloid neoplasm and increased blasts approximately 10-15%.  Trilineage hematopoiesis is present. - NGS panel: SRSF2, TET 2. - Intermediate 2 risk, with OS-31 months   2.  Stage I left breast cancer: -Diagnosed in Sep 1999, treated with lumpectomy and XRT. ER/PR was positive and received tamoxifen for 5 years. -Mammogram on 2019-10-30 was BI-RADS Category 2. Physical exam did not reveal any palpable masses today.   3.   Stage I adenocarcinoma of the left upper lobe of the lung: -Left upper lobectomy on 2005-06-01. CT chest on 2018-06-09 was stable. - CT CAP on 10/24/2020 shows stable postsurgical changes from the partial left upper lobe resection.  No other signs of malignancy.    Plan: 1.  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 - Unfortunately she missed her follow-up appointments due to hospitalizations. - Last Aranesp was on 05/04/2023. - We have reviewed bone marrow biopsy results which showed worsening of CMML with increase in blasts from 10-15%. - We have discussed normal treatment for high risk dysplastic CMML with hypomethylating agents.  However given her advanced age, I have recommended observation with continuing erythropoiesis stimulating agents to prevent transfusions. - She and her daughter are agreeable to the plan. - Labs today: Platelet count 57K and stable.  Hemoglobin 9.5.  White count 2.2.  Ferritin was 504, normal folic acid and B12.  LDH elevated at 210. - Continue Aranesp 200 mcg every 4 weeks. - Follow-up in 3 months with repeat labs and exam.     No orders of the defined types were placed in this encounter.     Alben Deeds Teague,acting as a Neurosurgeon for Doreatha Massed, MD.,have documented all relevant documentation on the behalf of Doreatha Massed, MD,as directed by  Doreatha Massed, MD while in the presence of Doreatha Massed, MD.  ***    Pueblito R Teague   12/2/20243:12 PM  CHIEF COMPLAINT:   Diagnosis: MDS   Cancer Staging  Invasive ductal carcinoma of left breast [facility]) Staging form: Breast, AJCC 7th Edition - Pathologic stage from 07/03/1998: Stage IA (T1a, N0, cM0) - Signed  by Ellouise Newer, PA-C on 06/09/2016    Prior Therapy: None  Current Therapy:  Aransesp    HISTORY OF PRESENT ILLNESS:   Oncology History Overview Note  Called Dr Scharlene Gloss office again now, office closed. Pt will see Dr  [name] and I will ask him if he can talk to Dr Margo Aye after his visit.  Thanks,   Adenocarcinoma of left lung (HCC)  06/23/2016 Imaging   CT chest- Stable exam.  No new or progressive findings. Previously described tiny perifissural nodules at the base of the right major fissure are stable and likely represent subpleural lymph nodes.      INTERVAL HISTORY:   Lisa [name] is a 89 y.o. female presenting to clinic today for follow up of MDS. She was last seen by me on 07/06/23.  Since her last visit, she was admitted to the hospital on 07/12/23, 07/31/23, and 08/26/23. Her 9/9/ admission was for acute on chronic hypoxic respiratory failure in the setting of NSTEMI with bilateral pleural effusion and was given a 48 hour course of IV heparin, ASA 81 mg OD, Imdur 60 mg, Toprol-XL 25 mg, and Pravachol increased to 40 mg. 9/28 admission was for toxic encephalopathy secondary to severe hyponatremia and probable aspiration pneumonia. She was given Lokelma, IV fluid tolvaptan, and Neupogen for MDS. Imdur was cut back to 15 mg and prescription of Lasix 20 mg OD was given at discharge on 10/14. Her last admission was due to septic shock from pneumonia secondary to UTI and was febrile. She was given cefepime while admitted and PO Levaquin on discharge at 10/29.   Today, she states that she is doing well overall. Her appetite level is at ***%. Her energy level is at ***%. She is accompanied by her daughter.    PAST MEDICAL HISTORY:   Past Medical History: Past Medical History:  Diagnosis Date   AAA (abdominal aortic aneurysm) (HCC)    Adenocarcinoma of left lung (HCC) 2006   AF (paroxysmal atrial fibrillation) (HCC)    Aortic stenosis    Arthritis    Asthma    Cancer of breast, female (HCC)    Cancer of lung (HCC)    Dysrhythmia    Hypertension    Hypothyroidism    Invasive ductal carcinoma of left breast (HCC) 1999   Mitral regurgitation    Myocardial infarction (HCC)    Neutropenia (HCC) 06/09/2016    NSTEMI (non-ST elevated myocardial infarction) (HCC) 04/06/2023   Personal history of radiation therapy    Pulmonary hypertension (HCC)    TIA (transient ischemic attack) 02/10/2022    Surgical History: Past Surgical History:  Procedure Laterality Date   BIOPSY  10/23/2020   Procedure: BIOPSY;  Surgeon: Benancio Deeds, MD;  Location: [facility];  Service: Gastroenterology;;   BREAST BIOPSY     left axillary node dissection   BREAST LUMPECTOMY Left    COLONOSCOPY  03/08/2003   JYN:[id number] rectal polyps destroyed with the tip of the snare/Polyps at hepatic flexure, splenic flexure at 35 cm/Left-sided diverticula: unable to retrieve path   COLONOSCOPY  09/12/2008   MVH:QIONGE rectum and distal sigmoid diminutive polyps/scattered left sided diverticulum. hyperplastic   COLONOSCOPY N/A 03/06/2013   XBM:WUXLKGM polyp-removed as described above; colonic diverticulosis. hyperplastic polyps. next TCS 03/2018   ESOPHAGOGASTRODUODENOSCOPY (EGD) WITH PROPOFOL N/A 10/23/2020   Procedure: ESOPHAGOGASTRODUODENOSCOPY (EGD) WITH PROPOFOL;  Surgeon: Benancio Deeds, MD;  Location: [facility] ENDOSCOPY;  Service: Gastroenterology;  Laterality: N/A;   FOOT SURGERY     LUNG  REMOVAL, PARTIAL     upper lobe    Social History: Social History   Socioeconomic History   Marital status: Widowed    Spouse name: Not on file   Number of children: Not on file   Years of education: Not on file   Highest education level: Not on file  Occupational History   Not on file  Tobacco Use   Smoking status: Former    Current packs/day: 0.00    Average packs/day: 0.8 packs/day for 35.0 years (26.3 ttl pk-yrs)    Types: Cigarettes    Start date: 72    Quit date: 62    Years since quitting: 31.9   Smokeless tobacco: Never   Tobacco comments:    smoke-free X 30 yeras  Vaping Use   Vaping status: Never Used  Substance and Sexual Activity   Alcohol use: No   Drug use: No   Sexual activity: Not  Currently  Other Topics Concern   Not on file  Social History Narrative   Not on file   Social Determinants of Health   Financial Resource Strain: Not on file  Food Insecurity: No Food Insecurity (08/27/2023)   Received from [facility]   Hunger Vital Sign    Worried About Running Out of Food in the Last Year: Never true    Ran Out of Food in the Last Year: Never true  Transportation Needs: No Transportation Needs (08/27/2023)   Received from [facility] - Transportation    Lack of Transportation (Medical): No    Lack of Transportation (Non-Medical): No  Physical Activity: Inactive (12/12/2020)   Exercise Vital Sign    Days of Exercise per Week: 0 days    Minutes of Exercise per Session: 0 min  Stress: No Stress Concern Present (08/27/2023)   Received from [facility] - Occupational Stress Questionnaire    Feeling of Stress : Not at all  Social Connections: Unknown (08/26/2023)   Received from [facility]   Social Network    Social Network: Not on file  Intimate Partner Violence: Not At Risk (08/27/2023)   Received from [facility]   HITS    Over the last 12 months how often did your partner physically hurt you?: Never    Over the last 12 months how often did your partner insult you or talk down to you?: Never    Over the last 12 months how often did your partner threaten you with physical harm?: Never    Over the last 12 months how often did your partner scream or curse at you?: Never    Family History: Family History  Problem Relation Age of Onset   Cancer Mother    Cancer Sister    Colon cancer Neg Hx     Current Medications:  Current Outpatient Medications:    acetaminophen (TYLENOL) 325 MG tablet, Take 1.5-3 tablets (487.5-975 mg total) by mouth every 6 (six) hours as needed for mild pain (or Fever >/= 101)., Disp: , Rfl:    aspirin EC 81 MG tablet, Take 1 tablet (81 mg total) by mouth daily.  Swallow whole., Disp: 30 tablet, Rfl: 2   Fluticasone-Umeclidin-Vilant (TRELEGY ELLIPTA) 100-62.5-25 MCG/ACT AEPB, Inhale 1 puff into the lungs daily., Disp: 28 each, Rfl: 0   furosemide (LASIX) 20 MG tablet, Take 1 tablet (20 mg total) by mouth daily., Disp: , Rfl:    isosorbide mononitrate (IMDUR) 30 MG 24 hr tablet,  Take 0.5 tablets (15 mg total) by mouth daily., Disp: , Rfl:    levothyroxine (SYNTHROID) 100 MCG tablet, Take 1 tablet (100 mcg total) by mouth daily before breakfast., Disp: 30 tablet, Rfl: 1   melatonin 3 MG TABS tablet, Take 1 tablet (3 mg total) by mouth at bedtime., Disp: 60 tablet, Rfl: 0   metoprolol succinate (TOPROL-XL) 25 MG 24 hr tablet, TAKE 1 TABLET DAILY, Disp: 90 tablet, Rfl: 1   mirtazapine (REMERON) 15 MG tablet, Take 1 tablet (15 mg total) by mouth at bedtime., Disp: 30 tablet, Rfl: 0   Multiple Vitamin (MULTI-VITAMIN) tablet, Take 1 tablet by mouth daily., Disp: , Rfl:    pravastatin (PRAVACHOL) 40 MG tablet, Take 1 tablet (40 mg total) by mouth every evening., Disp: 30 tablet, Rfl: 2   senna (SENOKOT) 8.6 MG TABS tablet, Take 1 tablet (8.6 mg total) by mouth at bedtime., Disp: 30 tablet, Rfl: 0   Allergies: Allergies  Allergen Reactions   Meloxicam Other (See Comments)    Caused an injury to the kidneys, per nephrologist   Micardis Hct [Telmisartan-Hctz] Other (See Comments)    Caused an injury to the kidneys, per nephrologist    REVIEW OF SYSTEMS:   Review of Systems  Constitutional:  Negative for chills, fatigue and fever.  HENT:   Negative for lump/mass, mouth sores, nosebleeds, sore throat and trouble swallowing.   Eyes:  Negative for eye problems.  Respiratory:  Negative for cough and shortness of breath.   Cardiovascular:  Negative for chest pain, leg swelling and palpitations.  Gastrointestinal:  Negative for abdominal pain, constipation, diarrhea, nausea and vomiting.  Genitourinary:  Negative for bladder incontinence, difficulty urinating,  dysuria, frequency, hematuria and nocturia.   Musculoskeletal:  Negative for arthralgias, back pain, flank pain, myalgias and neck pain.  Skin:  Negative for itching and rash.  Neurological:  Negative for dizziness, headaches and numbness.  Hematological:  Does not bruise/bleed easily.  Psychiatric/Behavioral:  Negative for depression, sleep disturbance and suicidal ideas. The patient is not nervous/anxious.   All other systems reviewed and are negative.    VITALS:   There were no vitals taken for this visit.  Wt Readings from Last 3 Encounters:  08/12/23 151 lb 7.3 oz (68.7 kg)  07/15/23 145 lb 11.6 oz (66.1 kg)  07/06/23 148 lb 12.8 oz (67.5 kg)    There is no height or weight on file to calculate BMI.  Performance status (ECOG): 1 - Symptomatic but completely ambulatory  PHYSICAL EXAM:   Physical Exam Vitals and nursing note reviewed. Exam conducted with a chaperone present.  Constitutional:      Appearance: Normal appearance.  Cardiovascular:     Rate and Rhythm: Normal rate and regular rhythm.     Pulses: Normal pulses.     Heart sounds: Normal heart sounds.  Pulmonary:     Effort: Pulmonary effort is normal.     Breath sounds: Normal breath sounds.  Abdominal:     Palpations: Abdomen is soft. There is no hepatomegaly, splenomegaly or mass.     Tenderness: There is no abdominal tenderness.  Musculoskeletal:     Right lower leg: No edema.     Left lower leg: No edema.  Lymphadenopathy:     Cervical: No cervical adenopathy.     Right cervical: No superficial, deep or posterior cervical adenopathy.    Left cervical: No superficial, deep or posterior cervical adenopathy.     Upper Body:     Right upper  body: No supraclavicular or axillary adenopathy.     Left upper body: No supraclavicular or axillary adenopathy.  Neurological:     General: No focal deficit present.     Mental Status: She is alert and oriented to person, place, and time.  Psychiatric:        Mood  and Affect: Mood normal.        Behavior: Behavior normal.     LABS:      Latest Ref Rng & Units 08/15/2023    3:56 AM 08/14/2023    4:35 AM 08/13/2023    5:44 AM  CBC  WBC 4.0 - 10.5 K/uL 3.9  3.5  3.9   Hemoglobin 12.0 - 15.0 g/dL 7.5  7.3  8.0   Hematocrit 36.0 - 46.0 % 25.9  25.5  27.8   Platelets 150 - 400 K/uL 73  69  65       Latest Ref Rng & Units 08/15/2023    3:56 AM 08/14/2023    4:35 AM 08/13/2023    5:44 AM  CMP  Glucose 70 - 99 mg/dL 84  85  80   BUN 8 - 23 mg/dL 15  17  19    Creatinine 0.44 - 1.00 mg/dL 3.32  9.51  8.84   Sodium 135 - 145 mmol/L 133  135  136   Potassium 3.5 - 5.1 mmol/L 3.7  3.8  3.6   Chloride 98 - 111 mmol/L 89  89  88   CO2 22 - 32 mmol/L 35  36  36   Calcium 8.9 - 10.3 mg/dL 9.4  9.4  9.0   Total Protein 6.5 - 8.1 g/dL 8.2  7.5  7.5   Total Bilirubin 0.3 - 1.2 mg/dL 0.8  0.8  0.9   Alkaline Phos 38 - 126 U/L 38  38  39   AST 15 - 41 U/L 29  33  30   ALT 0 - 44 U/L 12  12  13       No results found for: "CEA1", "CEA" / No results found for: "CEA1", "CEA" No results found for: "PSA1" No results found for: "CAN199" No results found for: "CAN125"  Lab Results  Component Value Date   TOTALPROTELP 9.0 (H) 04/01/2023   ALBUMINELP 3.5 04/01/2023   A1GS 0.3 04/01/2023   A2GS 0.6 04/01/2023   BETS 1.2 04/01/2023   GAMS 3.5 (H) 04/01/2023   MSPIKE Not Observed 04/01/2023   SPEI Comment 04/01/2023   Lab Results  Component Value Date   TIBC 249 (L) 08/10/2023   TIBC 275 07/06/2023   TIBC 318 04/01/2023   FERRITIN 504 (H) 07/06/2023   FERRITIN 533 (H) 04/01/2023   FERRITIN 360 (H) 02/12/2023   IRONPCTSAT 23 08/10/2023   IRONPCTSAT 24 07/06/2023   IRONPCTSAT 19 04/01/2023   Lab Results  Component Value Date   LDH 392 (H) 08/01/2023   LDH 210 (H) 07/06/2023   LDH 287 (H) 04/01/2023     STUDIES:   No results found.

## 2023-10-05 ENCOUNTER — Inpatient Hospital Stay: Payer: Medicare Other | Attending: Hematology | Admitting: Hematology

## 2023-10-05 ENCOUNTER — Inpatient Hospital Stay: Payer: Medicare Other

## 2023-10-05 ENCOUNTER — Ambulatory Visit: Payer: Medicare Other

## 2023-10-05 ENCOUNTER — Ambulatory Visit: Payer: Medicare Other | Admitting: Hematology

## 2023-10-05 DIAGNOSIS — I480 Paroxysmal atrial fibrillation: Secondary | ICD-10-CM | POA: Insufficient documentation

## 2023-10-05 DIAGNOSIS — D61818 Other pancytopenia: Secondary | ICD-10-CM | POA: Insufficient documentation

## 2023-10-05 DIAGNOSIS — Z515 Encounter for palliative care: Secondary | ICD-10-CM | POA: Insufficient documentation

## 2023-10-05 DIAGNOSIS — Z79899 Other long term (current) drug therapy: Secondary | ICD-10-CM | POA: Insufficient documentation

## 2023-10-05 DIAGNOSIS — Z8673 Personal history of transient ischemic attack (TIA), and cerebral infarction without residual deficits: Secondary | ICD-10-CM | POA: Insufficient documentation

## 2023-10-05 DIAGNOSIS — Z87891 Personal history of nicotine dependence: Secondary | ICD-10-CM | POA: Insufficient documentation

## 2023-10-05 DIAGNOSIS — C9310 Chronic myelomonocytic leukemia not having achieved remission: Secondary | ICD-10-CM | POA: Insufficient documentation

## 2023-10-05 DIAGNOSIS — I252 Old myocardial infarction: Secondary | ICD-10-CM | POA: Insufficient documentation

## 2023-10-05 DIAGNOSIS — I350 Nonrheumatic aortic (valve) stenosis: Secondary | ICD-10-CM | POA: Insufficient documentation

## 2023-10-05 DIAGNOSIS — Z8701 Personal history of pneumonia (recurrent): Secondary | ICD-10-CM | POA: Insufficient documentation

## 2023-10-05 DIAGNOSIS — Z888 Allergy status to other drugs, medicaments and biological substances status: Secondary | ICD-10-CM | POA: Insufficient documentation

## 2023-10-05 DIAGNOSIS — Z860102 Personal history of hyperplastic colon polyps: Secondary | ICD-10-CM | POA: Insufficient documentation

## 2023-10-05 DIAGNOSIS — Z85118 Personal history of other malignant neoplasm of bronchus and lung: Secondary | ICD-10-CM | POA: Insufficient documentation

## 2023-10-05 DIAGNOSIS — Z853 Personal history of malignant neoplasm of breast: Secondary | ICD-10-CM | POA: Insufficient documentation

## 2023-10-07 DIAGNOSIS — I130 Hypertensive heart and chronic kidney disease with heart failure and stage 1 through stage 4 chronic kidney disease, or unspecified chronic kidney disease: Secondary | ICD-10-CM | POA: Diagnosis not present

## 2023-10-07 DIAGNOSIS — N183 Chronic kidney disease, stage 3 unspecified: Secondary | ICD-10-CM | POA: Diagnosis not present

## 2023-10-07 DIAGNOSIS — D469 Myelodysplastic syndrome, unspecified: Secondary | ICD-10-CM | POA: Diagnosis not present

## 2023-10-07 DIAGNOSIS — E039 Hypothyroidism, unspecified: Secondary | ICD-10-CM | POA: Diagnosis not present

## 2023-10-07 DIAGNOSIS — I509 Heart failure, unspecified: Secondary | ICD-10-CM | POA: Diagnosis not present

## 2023-10-07 DIAGNOSIS — G7281 Critical illness myopathy: Secondary | ICD-10-CM | POA: Diagnosis not present

## 2023-10-07 DIAGNOSIS — J449 Chronic obstructive pulmonary disease, unspecified: Secondary | ICD-10-CM | POA: Diagnosis not present

## 2023-10-12 DIAGNOSIS — I130 Hypertensive heart and chronic kidney disease with heart failure and stage 1 through stage 4 chronic kidney disease, or unspecified chronic kidney disease: Secondary | ICD-10-CM | POA: Diagnosis not present

## 2023-10-12 DIAGNOSIS — I509 Heart failure, unspecified: Secondary | ICD-10-CM | POA: Diagnosis not present

## 2023-10-12 DIAGNOSIS — N183 Chronic kidney disease, stage 3 unspecified: Secondary | ICD-10-CM | POA: Diagnosis not present

## 2023-10-12 DIAGNOSIS — G7281 Critical illness myopathy: Secondary | ICD-10-CM | POA: Diagnosis not present

## 2023-10-12 DIAGNOSIS — J449 Chronic obstructive pulmonary disease, unspecified: Secondary | ICD-10-CM | POA: Diagnosis not present

## 2023-10-12 DIAGNOSIS — I252 Old myocardial infarction: Secondary | ICD-10-CM | POA: Diagnosis not present

## 2023-10-12 DIAGNOSIS — Z23 Encounter for immunization: Secondary | ICD-10-CM | POA: Diagnosis not present

## 2023-10-12 DIAGNOSIS — I480 Paroxysmal atrial fibrillation: Secondary | ICD-10-CM | POA: Diagnosis not present

## 2023-10-12 DIAGNOSIS — Z85110 Personal history of malignant carcinoid tumor of bronchus and lung: Secondary | ICD-10-CM | POA: Diagnosis not present

## 2023-10-12 DIAGNOSIS — E875 Hyperkalemia: Secondary | ICD-10-CM | POA: Diagnosis not present

## 2023-10-12 DIAGNOSIS — D469 Myelodysplastic syndrome, unspecified: Secondary | ICD-10-CM | POA: Diagnosis not present

## 2023-10-12 DIAGNOSIS — G4700 Insomnia, unspecified: Secondary | ICD-10-CM | POA: Diagnosis not present

## 2023-10-12 DIAGNOSIS — J9621 Acute and chronic respiratory failure with hypoxia: Secondary | ICD-10-CM | POA: Diagnosis not present

## 2023-10-12 DIAGNOSIS — J441 Chronic obstructive pulmonary disease with (acute) exacerbation: Secondary | ICD-10-CM | POA: Diagnosis not present

## 2023-10-12 DIAGNOSIS — H9193 Unspecified hearing loss, bilateral: Secondary | ICD-10-CM | POA: Diagnosis not present

## 2023-10-12 DIAGNOSIS — E039 Hypothyroidism, unspecified: Secondary | ICD-10-CM | POA: Diagnosis not present

## 2023-10-12 DIAGNOSIS — F411 Generalized anxiety disorder: Secondary | ICD-10-CM | POA: Diagnosis not present

## 2023-10-15 DIAGNOSIS — I509 Heart failure, unspecified: Secondary | ICD-10-CM | POA: Diagnosis not present

## 2023-10-15 DIAGNOSIS — J449 Chronic obstructive pulmonary disease, unspecified: Secondary | ICD-10-CM | POA: Diagnosis not present

## 2023-10-15 DIAGNOSIS — G7281 Critical illness myopathy: Secondary | ICD-10-CM | POA: Diagnosis not present

## 2023-10-15 DIAGNOSIS — I130 Hypertensive heart and chronic kidney disease with heart failure and stage 1 through stage 4 chronic kidney disease, or unspecified chronic kidney disease: Secondary | ICD-10-CM | POA: Diagnosis not present

## 2023-10-15 DIAGNOSIS — N183 Chronic kidney disease, stage 3 unspecified: Secondary | ICD-10-CM | POA: Diagnosis not present

## 2023-10-15 DIAGNOSIS — D469 Myelodysplastic syndrome, unspecified: Secondary | ICD-10-CM | POA: Diagnosis not present

## 2023-10-17 NOTE — Progress Notes (Signed)
[facility] [address], [place] [address]    Clinic Day:  10/18/2023  Referring physician: [name], MD  Patient Care Team: [name], MD as PCP - General (Internal Medicine) Mallipeddi, Orion Modest, MD as PCP - Cardiology (Cardiology) Jena Gauss Gerrit Friends, MD as Consulting Physician (Gastroenterology)   ASSESSMENT & PLAN:   Assessment: 1.    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 Margo Aye for pancytopenia. Subsequently her white count has recovered along with recovery of the hemoglobin. -Bone marrow biopsy on 11/06/2020 shows hypercellular marrow with trilineage dyspoiesis and polytypic plasmacytosis.  Primary differential includes CMML and other MDS.  Definitive blast population is not identified morphologically or by flow.  There is a increased immature mononuclear cells favored to be monocytic in origin.  Megakaryocytes are frequently hypolobated and hyperchromatic without clustering.  Atypical appearing erythroid precursors.  Increased plasma cells 5 to 9% which are polytypic by kappa and lambda staining. - Chromosome analysis shows 46,X, idiocentric X chromosome[9]/46, XX[11]. -MDS FISH panel was negative. -Revised IPSS score of 3.  In the absence of treatment, median survival of 5.3 years and 25% AML progression is 10.8 years. -SPEP, methylmalonic acid, ferritin levels were normal. - Serum copper level is 87. - NGS (11/19/2020): SRSF2, TET2 - BMBX (04/29/2023): Hypercellular marrow (80%) with persistent involvement by myeloid neoplasm and increased blasts approximately 10-15%.  Trilineage hematopoiesis is present. - NGS panel: SRSF2, TET 2. - Intermediate 2 risk, with OS-31 months   2.  Stage I left breast cancer: -Diagnosed in Sep 1999, treated with lumpectomy and XRT. ER/PR was positive and received tamoxifen for 5 years. -Mammogram on 2019-10-30 was BI-RADS Category 2. Physical exam did not reveal any palpable masses today.   3.   Stage I adenocarcinoma of the left upper lobe of the lung: -Left upper lobectomy on 2005-06-01. CT chest on 2018-06-09 was stable. - CT CAP on 10/24/2020 shows stable postsurgical changes from the partial left upper lobe resection.  No other signs of malignancy.    Plan: 1.  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  We started her on Aranesp to control her anemia. - She was hospitalized from 07/31/2023 through 08/16/2023 from hyponatremia/toxic encephalopathy/aspiration pneumonia. - She has received Aranesp 200 mcg every 4 weeks on 05/04/2023 and 08/01/2023.  Due to hospitalizations, she has missed doses. - I have discussed her diagnosis, previous bone marrow biopsy results and prognosis with her grandson (psychiatrist) over the phone.  She is enrolled in palliative care.  They are thinking about possible hospice if her condition deteriorates. - Reviewed labs today: Hemoglobin 7.9.  White count is 3.7 and platelet count 67.  Ferritin is 796 and percent saturation 16. - Continue Aranesp today and every 4 weeks.  RTC 3 months for follow-up with repeat ferritin and iron panel.    Orders Placed This Encounter  Procedures   Iron and TIBC ([facility] ONLY)    Standing Status:   Future    Number of Occurrences:   1    Expected Date:   10/18/2023    Expiration Date:   10/17/2024   Ferritin    Standing Status:   Future    Number of Occurrences:   1    Expected Date:   10/18/2023    Expiration Date:   10/17/2024   Iron and TIBC ([facility] ONLY)    Standing Status:  Future    Expected Date:   01/14/2024    Expiration Date:   10/17/2024   Ferritin    Standing Status:   Future    Expected Date:   01/14/2024    Expiration Date:   10/17/2024   CBC with Differential    Standing Status:   Future    Expected Date:   01/14/2024    Expiration Date:   10/17/2024      I,Katie Daubenspeck,acting as a  scribe for Doreatha Massed, MD.,have documented all relevant documentation on the behalf of Doreatha Massed, MD,as directed by  Doreatha Massed, MD while in the presence of Doreatha Massed, MD.   I, Doreatha Massed MD, have reviewed the above documentation for accuracy and completeness, and I agree with the above.   Doreatha Massed, MD   12/16/20245:14 PM  CHIEF COMPLAINT:   Diagnosis: MDS    Cancer Staging  Invasive ductal carcinoma of left breast [facility]) Staging form: Breast, AJCC 7th Edition - Pathologic stage from 07/03/1998: Stage IA (T1a, N0, cM0) - Signed by Ellouise Newer, PA-C on 06/09/2016    Prior Therapy: none  Current Therapy:  Aransesp    HISTORY OF PRESENT ILLNESS:   Oncology History Overview Note  Called Dr Scharlene Gloss office again now, office closed. Pt will see Dr [name] and I will ask him if he can talk to Dr Margo Aye after his visit.  Thanks,   Adenocarcinoma of left lung (HCC)  06/23/2016 Imaging   CT chest- Stable exam.  No new or progressive findings. Previously described tiny perifissural nodules at the base of the right major fissure are stable and likely represent subpleural lymph nodes.      INTERVAL HISTORY:   Lisa [name] is a 89 y.o. female presenting to clinic today for follow up of MDS. She was last seen by me on 07/06/23.  Since her last visit, she was admitted to the hospital on three separate occasions-- on 07/12/23 for acute respiratory failure, on 07/31/23 for neutropenia and hyponatremia, and on 08/26/23 for fever and sepsis.  Today, she states that she is doing well overall. Her appetite level is at 50%. Her energy level is at 10%.  PAST MEDICAL HISTORY:   Past Medical History: Past Medical History:  Diagnosis Date   AAA (abdominal aortic aneurysm) (HCC)    Adenocarcinoma of left lung (HCC) 2006   AF (paroxysmal atrial fibrillation) (HCC)    Aortic stenosis    Arthritis    Asthma    Cancer of breast, female (HCC)     Cancer of lung (HCC)    Dysrhythmia    Hypertension    Hypothyroidism    Invasive ductal carcinoma of left breast (HCC) 1999   Mitral regurgitation    Myocardial infarction (HCC)    Neutropenia (HCC) 06/09/2016   NSTEMI (non-ST elevated myocardial infarction) (HCC) 04/06/2023   Personal history of radiation therapy    Pulmonary hypertension (HCC)    TIA (transient ischemic attack) 02/10/2022    Surgical History: Past Surgical History:  Procedure Laterality Date   BIOPSY  10/23/2020   Procedure: BIOPSY;  Surgeon: Benancio Deeds, MD;  Location: [facility] ENDOSCOPY;  Service: Gastroenterology;;   BREAST BIOPSY     left axillary node dissection   BREAST LUMPECTOMY Left    COLONOSCOPY  03/08/2003   WFU:[id number] rectal polyps destroyed with the tip of the snare/Polyps at hepatic flexure, splenic flexure at 35 cm/Left-sided diverticula: unable to retrieve path   COLONOSCOPY  09/12/2008   GUR:KYHCWC  rectum and distal sigmoid diminutive polyps/scattered left sided diverticulum. hyperplastic   COLONOSCOPY N/A 03/06/2013   ZOX:[id number] polyp-removed as described above; colonic diverticulosis. hyperplastic polyps. next TCS 03/2018   ESOPHAGOGASTRODUODENOSCOPY (EGD) WITH PROPOFOL N/A 10/23/2020   Procedure: ESOPHAGOGASTRODUODENOSCOPY (EGD) WITH PROPOFOL;  Surgeon: Benancio Deeds, MD;  Location: [facility] ENDOSCOPY;  Service: Gastroenterology;  Laterality: N/A;   FOOT SURGERY     LUNG REMOVAL, PARTIAL     upper lobe    Social History: Social History   Socioeconomic History   Marital status: Widowed    Spouse name: Not on file   Number of children: Not on file   Years of education: Not on file   Highest education level: Not on file  Occupational History   Not on file  Tobacco Use   Smoking status: Former    Current packs/day: 0.00    Average packs/day: 0.8 packs/day for 35.0 years (26.3 ttl pk-yrs)    Types: Cigarettes    Start date: 49    Quit date: 29    Years since quitting:  31.9   Smokeless tobacco: Never   Tobacco comments:    smoke-free X 30 yeras  Vaping Use   Vaping status: Never Used  Substance and Sexual Activity   Alcohol use: No   Drug use: No   Sexual activity: Not Currently  Other Topics Concern   Not on file  Social History Narrative   Not on file   Social Drivers of Health   Financial Resource Strain: Not on file  Food Insecurity: No Food Insecurity (08/27/2023)   Received from [facility]   Hunger Vital Sign    Worried About Running Out of Food in the Last Year: Never true    Ran Out of Food in the Last Year: Never true  Transportation Needs: No Transportation Needs (08/27/2023)   Received from [facility] - Transportation    Lack of Transportation (Medical): No    Lack of Transportation (Non-Medical): No  Physical Activity: Inactive (12/12/2020)   Exercise Vital Sign    Days of Exercise per Week: 0 days    Minutes of Exercise per Session: 0 min  Stress: No Stress Concern Present (08/27/2023)   Received from [facility] - Occupational Stress Questionnaire    Feeling of Stress : Not at all  Social Connections: Unknown (08/26/2023)   Received from [facility]   Social Network    Social Network: Not on file  Intimate Partner Violence: Not At Risk (08/27/2023)   Received from [facility]   HITS    Over the last 12 months how often did your partner physically hurt you?: Never    Over the last 12 months how often did your partner insult you or talk down to you?: Never    Over the last 12 months how often did your partner threaten you with physical harm?: Never    Over the last 12 months how often did your partner scream or curse at you?: Never    Family History: Family History  Problem Relation Age of Onset   Cancer Mother    Cancer Sister    Colon cancer Neg Hx     Current Medications:  Current Outpatient Medications:    budesonide-formoterol (SYMBICORT)  80-4.5 MCG/ACT inhaler, Inhale 2 puffs into the lungs 2 (two) times daily., Disp: , Rfl:    Fluticasone-Umeclidin-Vilant (TRELEGY ELLIPTA) 100-62.5-25 MCG/ACT AEPB, Inhale 1 puff into the lungs daily.,  Disp: 28 each, Rfl: 0   furosemide (LASIX) 20 MG tablet, Take 1 tablet (20 mg total) by mouth daily., Disp: , Rfl:    levothyroxine (SYNTHROID) 100 MCG tablet, Take 1 tablet (100 mcg total) by mouth daily before breakfast., Disp: 30 tablet, Rfl: 1   Magnesium 400 MG CAPS, as directed Orally, Disp: , Rfl:    metoprolol succinate (TOPROL-XL) 25 MG 24 hr tablet, TAKE 1 TABLET DAILY, Disp: 90 tablet, Rfl: 1   mirtazapine (REMERON) 15 MG tablet, Take 1 tablet (15 mg total) by mouth at bedtime., Disp: 30 tablet, Rfl: 0   Multiple Vitamin (MULTI-VITAMIN) tablet, Take 1 tablet by mouth daily., Disp: , Rfl:    pantoprazole (PROTONIX) 40 MG tablet, Take 40 mg by mouth daily., Disp: , Rfl:    pravastatin (PRAVACHOL) 40 MG tablet, Take 1 tablet (40 mg total) by mouth every evening., Disp: 30 tablet, Rfl: 2   traZODone (DESYREL) 100 MG tablet, Take 100 mg by mouth at bedtime., Disp: , Rfl:    acetaminophen (TYLENOL) 325 MG tablet, Take 1.5-3 tablets (487.5-975 mg total) by mouth every 6 (six) hours as needed for mild pain (or Fever >/= 101). (Patient not taking: Reported on 10/18/2023), Disp: , Rfl:    OLANZapine (ZYPREXA) 5 MG tablet, Take 5 mg by mouth daily. (Patient not taking: Reported on 10/18/2023), Disp: , Rfl:    Allergies: Allergies  Allergen Reactions   Meloxicam Other (See Comments)    Caused an injury to the kidneys, per nephrologist   Micardis Hct [Telmisartan-Hctz] Other (See Comments)    Caused an injury to the kidneys, per nephrologist    REVIEW OF SYSTEMS:   Review of Systems  Constitutional:  Negative for chills, fatigue and fever.  HENT:   Negative for lump/mass, mouth sores, nosebleeds, sore throat and trouble swallowing.   Eyes:  Negative for eye problems.  Respiratory:  Positive  for cough. Negative for shortness of breath.   Cardiovascular:  Negative for chest pain, leg swelling and palpitations.  Gastrointestinal:  Negative for abdominal pain, constipation, diarrhea, nausea and vomiting.  Genitourinary:  Negative for bladder incontinence, difficulty urinating, dysuria, frequency, hematuria and nocturia.   Musculoskeletal:  Negative for arthralgias, back pain, flank pain, myalgias and neck pain.  Skin:  Negative for itching and rash.  Neurological:  Positive for numbness. Negative for dizziness and headaches.  Hematological:  Does not bruise/bleed easily.  Psychiatric/Behavioral:  Positive for sleep disturbance. Negative for depression and suicidal ideas. The patient is not nervous/anxious.   All other systems reviewed and are negative.    VITALS:   Blood pressure (!) 106/57, pulse 80, temperature 98.1 F (36.7 C), temperature source Oral, resp. rate 18, weight 133 lb 2.5 oz (60.4 kg), SpO2 95%.  Wt Readings from Last 3 Encounters:  10/18/23 133 lb 2.5 oz (60.4 kg)  08/12/23 151 lb 7.3 oz (68.7 kg)  07/15/23 145 lb 11.6 oz (66.1 kg)    Body mass index is 23.59 kg/m.  Performance status (ECOG): 2 - Symptomatic, <50% confined to bed  PHYSICAL EXAM:   Physical Exam Vitals and nursing note reviewed. Exam conducted with a chaperone present.  Constitutional:      Appearance: Normal appearance.  Cardiovascular:     Rate and Rhythm: Normal rate and regular rhythm.     Pulses: Normal pulses.     Heart sounds: Normal heart sounds.  Pulmonary:     Effort: Pulmonary effort is normal.     Breath sounds: Normal breath  sounds.  Abdominal:     Palpations: Abdomen is soft. There is no hepatomegaly, splenomegaly or mass.     Tenderness: There is no abdominal tenderness.  Musculoskeletal:     Right lower leg: No edema.     Left lower leg: No edema.  Lymphadenopathy:     Cervical: No cervical adenopathy.     Right cervical: No superficial, deep or posterior  cervical adenopathy.    Left cervical: No superficial, deep or posterior cervical adenopathy.     Upper Body:     Right upper body: No supraclavicular or axillary adenopathy.     Left upper body: No supraclavicular or axillary adenopathy.  Neurological:     General: No focal deficit present.     Mental Status: She is alert and oriented to person, place, and time.  Psychiatric:        Mood and Affect: Mood normal.        Behavior: Behavior normal.     LABS:      Latest Ref Rng & Units 10/18/2023    9:11 AM 08/15/2023    3:56 AM 08/14/2023    4:35 AM  CBC  WBC 4.0 - 10.5 K/uL 3.7  3.9  3.5   Hemoglobin 12.0 - 15.0 g/dL 7.9  7.5  7.3   Hematocrit 36.0 - 46.0 % 27.3  25.9  25.5   Platelets 150 - 400 K/uL 67  73  69       Latest Ref Rng & Units 08/15/2023    3:56 AM 08/14/2023    4:35 AM 08/13/2023    5:44 AM  CMP  Glucose 70 - 99 mg/dL 84  85  80   BUN 8 - 23 mg/dL 15  17  19    Creatinine 0.44 - 1.00 mg/dL 6.57  8.46  9.62   Sodium 135 - 145 mmol/L 133  135  136   Potassium 3.5 - 5.1 mmol/L 3.7  3.8  3.6   Chloride 98 - 111 mmol/L 89  89  88   CO2 22 - 32 mmol/L 35  36  36   Calcium 8.9 - 10.3 mg/dL 9.4  9.4  9.0   Total Protein 6.5 - 8.1 g/dL 8.2  7.5  7.5   Total Bilirubin 0.3 - 1.2 mg/dL 0.8  0.8  0.9   Alkaline Phos 38 - 126 U/L 38  38  39   AST 15 - 41 U/L 29  33  30   ALT 0 - 44 U/L 12  12  13       No results found for: "CEA1", "CEA" / No results found for: "CEA1", "CEA" No results found for: "PSA1" No results found for: "CAN199" No results found for: "CAN125"  Lab Results  Component Value Date   TOTALPROTELP 9.0 (H) 04/01/2023   ALBUMINELP 3.5 04/01/2023   A1GS 0.3 04/01/2023   A2GS 0.6 04/01/2023   BETS 1.2 04/01/2023   GAMS 3.5 (H) 04/01/2023   MSPIKE Not Observed 04/01/2023   SPEI Comment 04/01/2023   Lab Results  Component Value Date   TIBC 256 10/18/2023   TIBC 249 (L) 08/10/2023   TIBC 275 07/06/2023   FERRITIN 796 (H) 10/18/2023    FERRITIN 504 (H) 07/06/2023   FERRITIN 533 (H) 04/01/2023   IRONPCTSAT 16 10/18/2023   IRONPCTSAT 23 08/10/2023   IRONPCTSAT 24 07/06/2023   Lab Results  Component Value Date   LDH 392 (H) 08/01/2023   LDH 210 (H) 07/06/2023   LDH  287 (H) 04/01/2023     STUDIES:   No results found.

## 2023-10-18 ENCOUNTER — Inpatient Hospital Stay: Payer: Medicare Other

## 2023-10-18 ENCOUNTER — Inpatient Hospital Stay (HOSPITAL_BASED_OUTPATIENT_CLINIC_OR_DEPARTMENT_OTHER): Payer: Medicare Other | Admitting: Hematology

## 2023-10-18 VITALS — BP 106/57 | HR 80 | Temp 98.1°F | Resp 18 | Wt 133.2 lb

## 2023-10-18 DIAGNOSIS — C9310 Chronic myelomonocytic leukemia not having achieved remission: Secondary | ICD-10-CM

## 2023-10-18 DIAGNOSIS — Z87891 Personal history of nicotine dependence: Secondary | ICD-10-CM | POA: Diagnosis not present

## 2023-10-18 DIAGNOSIS — Z85118 Personal history of other malignant neoplasm of bronchus and lung: Secondary | ICD-10-CM | POA: Diagnosis not present

## 2023-10-18 DIAGNOSIS — Z8673 Personal history of transient ischemic attack (TIA), and cerebral infarction without residual deficits: Secondary | ICD-10-CM | POA: Diagnosis not present

## 2023-10-18 DIAGNOSIS — Z515 Encounter for palliative care: Secondary | ICD-10-CM | POA: Diagnosis not present

## 2023-10-18 DIAGNOSIS — D469 Myelodysplastic syndrome, unspecified: Secondary | ICD-10-CM

## 2023-10-18 DIAGNOSIS — D539 Nutritional anemia, unspecified: Secondary | ICD-10-CM

## 2023-10-18 DIAGNOSIS — I350 Nonrheumatic aortic (valve) stenosis: Secondary | ICD-10-CM | POA: Diagnosis not present

## 2023-10-18 DIAGNOSIS — D61818 Other pancytopenia: Secondary | ICD-10-CM | POA: Diagnosis not present

## 2023-10-18 DIAGNOSIS — Z860102 Personal history of hyperplastic colon polyps: Secondary | ICD-10-CM | POA: Diagnosis not present

## 2023-10-18 DIAGNOSIS — I252 Old myocardial infarction: Secondary | ICD-10-CM | POA: Diagnosis not present

## 2023-10-18 DIAGNOSIS — Z8701 Personal history of pneumonia (recurrent): Secondary | ICD-10-CM | POA: Diagnosis not present

## 2023-10-18 DIAGNOSIS — Z888 Allergy status to other drugs, medicaments and biological substances status: Secondary | ICD-10-CM | POA: Diagnosis not present

## 2023-10-18 DIAGNOSIS — Z853 Personal history of malignant neoplasm of breast: Secondary | ICD-10-CM | POA: Diagnosis not present

## 2023-10-18 DIAGNOSIS — Z79899 Other long term (current) drug therapy: Secondary | ICD-10-CM | POA: Diagnosis not present

## 2023-10-18 DIAGNOSIS — I480 Paroxysmal atrial fibrillation: Secondary | ICD-10-CM | POA: Diagnosis not present

## 2023-10-18 DIAGNOSIS — D4620 Refractory anemia with excess of blasts, unspecified: Secondary | ICD-10-CM | POA: Diagnosis present

## 2023-10-18 LAB — CBC
HCT: 27.3 % — ABNORMAL LOW (ref 36.0–46.0)
Hemoglobin: 7.9 g/dL — ABNORMAL LOW (ref 12.0–15.0)
MCH: 29.3 pg (ref 26.0–34.0)
MCHC: 28.9 g/dL — ABNORMAL LOW (ref 30.0–36.0)
MCV: 101.1 fL — ABNORMAL HIGH (ref 80.0–100.0)
Platelets: 67 10*3/uL — ABNORMAL LOW (ref 150–400)
RBC: 2.7 MIL/uL — ABNORMAL LOW (ref 3.87–5.11)
RDW: 19.5 % — ABNORMAL HIGH (ref 11.5–15.5)
WBC: 3.7 10*3/uL — ABNORMAL LOW (ref 4.0–10.5)
nRBC: 1.9 % — ABNORMAL HIGH (ref 0.0–0.2)

## 2023-10-18 LAB — IRON AND TIBC
Iron: 42 ug/dL (ref 28–170)
Saturation Ratios: 16 % (ref 10.4–31.8)
TIBC: 256 ug/dL (ref 250–450)
UIBC: 214 ug/dL

## 2023-10-18 LAB — SAMPLE TO BLOOD BANK

## 2023-10-18 LAB — FERRITIN: Ferritin: 796 ng/mL — ABNORMAL HIGH (ref 11–307)

## 2023-10-18 MED ORDER — DARBEPOETIN ALFA 200 MCG/0.4ML IJ SOSY
200.0000 ug | PREFILLED_SYRINGE | Freq: Once | INTRAMUSCULAR | Status: AC
  Administered 2023-10-18: 200 ug via SUBCUTANEOUS
  Filled 2023-10-18: qty 0.4

## 2023-10-18 NOTE — Patient Instructions (Addendum)
[facility] at [facility] Discharge Instructions   You were seen and examined today by Dr. [name].  He reviewed the results of your lab work. Your hemoglobin is 7.9 today. You will receive your Aranesp injection today.   We will check you iron levels today. If they are low we will arrange for you to have an iron infusion here in the clinic.   We will see you back in 3 months. We will repeat lab work at this time.  Return as scheduled.    Thank you for choosing [facility] at [facility] to provide your oncology and hematology care.  To afford each patient quality time with our provider, please arrive at least 15 minutes before your scheduled appointment time.   If you have a lab appointment with the Cancer Center please come in thru the Main Entrance and check in at the main information desk.  You need to re-schedule your appointment should you arrive 10 or more minutes late.  We strive to give you quality time with our providers, and arriving late affects you and other patients whose appointments are after yours.  Also, if you no show three or more times for appointments you may be dismissed from the clinic at the providers discretion.     Again, thank you for choosing [facility].  Our hope is that these requests will decrease the amount of time that you wait before being seen by our physicians.       _____________________________________________________________  Should you have questions after your visit to [facility], please contact our office at [phone] and follow the prompts.  Our office hours are 8:00 a.m. and 4:30 p.m. Monday - Friday.  Please note that voicemails left after 4:00 p.m. may not be returned until the following business day.  We are closed weekends and major holidays.  You do have access to a nurse 24-7, just call the main number to the clinic [phone] and do not press any options, hold  on the line and a nurse will answer the phone.    For prescription refill requests, have your pharmacy contact our office and allow 72 hours.    Due to Covid, you will need to wear a mask upon entering the hospital. If you do not have a mask, a mask will be given to you at the Main Entrance upon arrival. For doctor visits, patients may have 1 support person age 7 or older with them. For treatment visits, patients can not have anyone with them due to social distancing guidelines and our immunocompromised population.

## 2023-10-18 NOTE — Patient Instructions (Signed)
[facility] [place] - A DEPT OF [facility]  Discharge Instructions: Thank you for choosing [facility] to provide your oncology and hematology care.  If you have a lab appointment with the Cancer Center - please note that after April 8th, 2024, all labs will be drawn in the cancer center.  You do not have to check in or register with the main entrance as you have in the past but will complete your check-in in the cancer center.  Wear comfortable clothing and clothing appropriate for easy access to any Portacath or PICC line.   We strive to give you quality time with your provider. You may need to reschedule your appointment if you arrive late (15 or more minutes).  Arriving late affects you and other patients whose appointments are after yours.  Also, if you miss three or more appointments without notifying the office, you may be dismissed from the clinic at the provider's discretion.      For prescription refill requests, have your pharmacy contact our office and allow 72 hours for refills to be completed.    Today you received the following Aranesp injection.  Darbepoetin Alfa Injection What is this medication? DARBEPOETIN ALFA (dar be POE e tin AL fa) treats low levels of red blood cells (anemia) caused by kidney disease or chemotherapy. It works by Systems analyst make more red blood cells, which reduces the need for blood transfusions. This medicine may be used for other purposes; ask your health care provider or pharmacist if you have questions. COMMON BRAND NAME(S): Aranesp What should I tell my care team before I take this medication? They need to know if you have any of these conditions: Blood clots Cancer Heart disease High blood pressure On dialysis Seizures Stroke An unusual or allergic reaction to darbepoetin, latex, other medications, foods, dyes, or preservatives Pregnant or trying to get pregnant Breast-feeding How should I use  this medication? This medication is injected into a vein or under the skin. It is usually given by a care team in a hospital or clinic setting. It may also be given at home. If you get this medication at home, you will be taught how to prepare and give it. Use exactly as directed. Take it as directed on the prescription label at the same time every day. Keep taking it unless your care team tells you to stop. It is important that you put your used needles and syringes in a special sharps container. Do not put them in a trash can. If you do not have a sharps container, call your pharmacist or care team to get one. A special MedGuide will be given to you by the pharmacist with each prescription and refill. Be sure to read this information carefully each time. Talk to your care team about the use of this medication in children. While this medication may be used in children as young as 1 month of age for selected conditions, precautions do apply. Overdosage: If you think you have taken too much of this medicine contact a poison control center or emergency room at once. NOTE: This medicine is only for you. Do not share this medicine with others. What if I miss a dose? If you miss a dose, take it as soon as you can. If it is almost time for your next dose, take only that dose. Do not take double or extra doses. What may interact with this medication? Epoetin alfa Methoxy polyethylene  glycol-epoetin beta This list may not describe all possible interactions. Give your health care provider a list of all the medicines, herbs, non-prescription drugs, or dietary supplements you use. Also tell them if you smoke, drink alcohol, or use illegal drugs. Some items may interact with your medicine. What should I watch for while using this medication? Visit your care team for regular checks on your progress. Check your blood pressure as directed. Know what your blood pressure should be and when to contact your care team.  Your condition will be monitored carefully while you are receiving this medication. You may need blood work while taking this medication. What side effects may I notice from receiving this medication? Side effects that you should report to your care team as soon as possible: Allergic reactions--skin rash, itching, hives, swelling of the face, lips, tongue, or throat Blood clot--pain, swelling, or warmth in the leg, shortness of breath, chest pain Heart attack--pain or tightness in the chest, shoulders, arms, or jaw, nausea, shortness of breath, cold or clammy skin, feeling faint or lightheaded Increase in blood pressure Rash, fever, and swollen lymph nodes Redness, blistering, peeling, or loosening of the skin, including inside the mouth Seizures Stroke--sudden numbness or weakness of the face, arm, or leg, trouble speaking, confusion, trouble walking, loss of balance or coordination, dizziness, severe headache, change in vision Side effects that usually do not require medical attention (report to your care team if they continue or are bothersome): Cough Stomach pain Swelling of the ankles, hands, or feet This list may not describe all possible side effects. Call your doctor for medical advice about side effects. You may report side effects to FDA at 1-800-FDA-1088. Where should I keep my medication? Keep out of the reach of children and pets. Store in a refrigerator. Do not freeze. Do not shake. Protect from light. Keep this medication in the original container until you are ready to take it. See product for storage information. Get rid of any unused medication after the expiration date. To get rid of medications that are no longer needed or have expired: Take the medication to a medication take-back program. Check with your pharmacy or law enforcement to find a location. If you cannot return the medication, ask your pharmacist or care team how to get rid of the medication safely. NOTE: This  sheet is a summary. It may not cover all possible information. If you have questions about this medicine, talk to your doctor, pharmacist, or health care provider.  2024 Elsevier/Gold Standard (2022-02-18 00:00:00)    To help prevent nausea and vomiting after your treatment, we encourage you to take your nausea medication as directed.  BELOW ARE SYMPTOMS THAT SHOULD BE REPORTED IMMEDIATELY: *FEVER GREATER THAN 100.4 F (38 C) OR HIGHER *CHILLS OR SWEATING *NAUSEA AND VOMITING THAT IS NOT CONTROLLED WITH YOUR NAUSEA MEDICATION *UNUSUAL SHORTNESS OF BREATH *UNUSUAL BRUISING OR BLEEDING *URINARY PROBLEMS (pain or burning when urinating, or frequent urination) *BOWEL PROBLEMS (unusual diarrhea, constipation, pain near the anus) TENDERNESS IN MOUTH AND THROAT WITH OR WITHOUT PRESENCE OF ULCERS (sore throat, sores in mouth, or a toothache) UNUSUAL RASH, SWELLING OR PAIN  UNUSUAL VAGINAL DISCHARGE OR ITCHING   Items with * indicate a potential emergency and should be followed up as soon as possible or go to the Emergency Department if any problems should occur.  Please show the CHEMOTHERAPY ALERT CARD or IMMUNOTHERAPY ALERT CARD at check-in to the Emergency Department and triage nurse.  Should you have questions after your  visit or need to cancel or reschedule your appointment, please contact [facility] [place] - A DEPT OF [facility]igh[facility] [phone]  and follow the prompts.  Office hours are 8:00 a.m. to 4:30 p.m. Monday - Friday. Please note that voicemails left after 4:00 p.m. may not be returned until the following business day.  We are closed weekends and major holidays. You have access to a nurse at all times for urgent questions. Please call the main number to the clinic [phone] and follow the prompts.  For any non-urgent questions, you may also contact your provider using MyChart. We now offer e-Visits for anyone 77 and older to request care online for non-urgent  symptoms. For details visit [email or web address].   Also download the MyChart app! Go to the app store, search "MyChart", open the app, select [place], and log in with your MyChart username and password.

## 2023-10-18 NOTE — Progress Notes (Signed)
Patient presents today for Aranesp injection. Hgb 7.9. Patient tolerated injection in right arm well with no complaints voiced.  Site clean and dry with no bruising or swelling noted.  No complaints of pain.  Discharged with vital signs stable and no signs or symptoms of distress noted.

## 2023-10-19 DIAGNOSIS — Z7951 Long term (current) use of inhaled steroids: Secondary | ICD-10-CM | POA: Diagnosis not present

## 2023-10-19 DIAGNOSIS — I714 Abdominal aortic aneurysm, without rupture, unspecified: Secondary | ICD-10-CM | POA: Diagnosis not present

## 2023-10-19 DIAGNOSIS — J449 Chronic obstructive pulmonary disease, unspecified: Secondary | ICD-10-CM | POA: Diagnosis not present

## 2023-10-19 DIAGNOSIS — E039 Hypothyroidism, unspecified: Secondary | ICD-10-CM | POA: Diagnosis not present

## 2023-10-19 DIAGNOSIS — Z8701 Personal history of pneumonia (recurrent): Secondary | ICD-10-CM | POA: Diagnosis not present

## 2023-10-19 DIAGNOSIS — G7281 Critical illness myopathy: Secondary | ICD-10-CM | POA: Diagnosis not present

## 2023-10-19 DIAGNOSIS — D469 Myelodysplastic syndrome, unspecified: Secondary | ICD-10-CM | POA: Diagnosis not present

## 2023-10-19 DIAGNOSIS — E785 Hyperlipidemia, unspecified: Secondary | ICD-10-CM | POA: Diagnosis not present

## 2023-10-19 DIAGNOSIS — Z7982 Long term (current) use of aspirin: Secondary | ICD-10-CM | POA: Diagnosis not present

## 2023-10-19 DIAGNOSIS — Z9181 History of falling: Secondary | ICD-10-CM | POA: Diagnosis not present

## 2023-10-19 DIAGNOSIS — R911 Solitary pulmonary nodule: Secondary | ICD-10-CM | POA: Diagnosis not present

## 2023-10-19 DIAGNOSIS — N183 Chronic kidney disease, stage 3 unspecified: Secondary | ICD-10-CM | POA: Diagnosis not present

## 2023-10-19 DIAGNOSIS — I509 Heart failure, unspecified: Secondary | ICD-10-CM | POA: Diagnosis not present

## 2023-10-19 DIAGNOSIS — I480 Paroxysmal atrial fibrillation: Secondary | ICD-10-CM | POA: Diagnosis not present

## 2023-10-19 DIAGNOSIS — I130 Hypertensive heart and chronic kidney disease with heart failure and stage 1 through stage 4 chronic kidney disease, or unspecified chronic kidney disease: Secondary | ICD-10-CM | POA: Diagnosis not present

## 2023-10-19 DIAGNOSIS — Z9981 Dependence on supplemental oxygen: Secondary | ICD-10-CM | POA: Diagnosis not present

## 2023-10-20 DIAGNOSIS — J449 Chronic obstructive pulmonary disease, unspecified: Secondary | ICD-10-CM | POA: Diagnosis not present

## 2023-10-20 DIAGNOSIS — G7281 Critical illness myopathy: Secondary | ICD-10-CM | POA: Diagnosis not present

## 2023-10-20 DIAGNOSIS — D469 Myelodysplastic syndrome, unspecified: Secondary | ICD-10-CM | POA: Diagnosis not present

## 2023-10-20 DIAGNOSIS — N183 Chronic kidney disease, stage 3 unspecified: Secondary | ICD-10-CM | POA: Diagnosis not present

## 2023-10-20 DIAGNOSIS — I130 Hypertensive heart and chronic kidney disease with heart failure and stage 1 through stage 4 chronic kidney disease, or unspecified chronic kidney disease: Secondary | ICD-10-CM | POA: Diagnosis not present

## 2023-10-20 DIAGNOSIS — I509 Heart failure, unspecified: Secondary | ICD-10-CM | POA: Diagnosis not present

## 2023-10-22 ENCOUNTER — Encounter: Payer: Self-pay | Admitting: Internal Medicine

## 2023-10-22 ENCOUNTER — Ambulatory Visit: Payer: Medicare Other | Attending: Internal Medicine | Admitting: Internal Medicine

## 2023-10-22 VITALS — BP 100/62 | HR 76 | Ht 67.0 in | Wt 135.0 lb

## 2023-10-22 DIAGNOSIS — I5032 Chronic diastolic (congestive) heart failure: Secondary | ICD-10-CM | POA: Diagnosis not present

## 2023-10-22 DIAGNOSIS — I7143 Infrarenal abdominal aortic aneurysm, without rupture: Secondary | ICD-10-CM | POA: Diagnosis not present

## 2023-10-22 DIAGNOSIS — J9611 Chronic respiratory failure with hypoxia: Secondary | ICD-10-CM | POA: Diagnosis not present

## 2023-10-22 DIAGNOSIS — I350 Nonrheumatic aortic (valve) stenosis: Secondary | ICD-10-CM | POA: Insufficient documentation

## 2023-10-22 DIAGNOSIS — I480 Paroxysmal atrial fibrillation: Secondary | ICD-10-CM | POA: Diagnosis not present

## 2023-10-22 DIAGNOSIS — I2510 Atherosclerotic heart disease of native coronary artery without angina pectoris: Secondary | ICD-10-CM | POA: Diagnosis not present

## 2023-10-22 MED ORDER — FUROSEMIDE 20 MG PO TABS: 20.0000 mg | ORAL_TABLET | Freq: Two times a day (BID) | ORAL | 3 refills | Status: DC

## 2023-10-22 NOTE — Patient Instructions (Signed)
Medication Instructions:  Your physician recommends that you continue on your current medications as directed. Please refer to the Current Medication list given to you today.  *If you need a refill on your cardiac medications before your next appointment, please call your pharmacy*   Lab Work: None If you have labs (blood work) drawn today and your tests are completely normal, you will receive your results only by: MyChart Message (if you have MyChart) OR A paper copy in the mail If you have any lab test that is abnormal or we need to change your treatment, we will call you to review the results.   Testing/Procedures: None   Follow-Up: At [facility], you and your health needs are our priority.  As part of our continuing mission to provide you with exceptional heart care, we have created designated Provider Care Teams.  These Care Teams include your primary Cardiologist (physician) and Advanced Practice Providers (APPs -  Physician Assistants and Nurse Practitioners) who all work together to provide you with the care you need, when you need it.  We recommend signing up for the patient portal called "MyChart".  Sign up information is provided on this After Visit Summary.  MyChart is used to connect with patients for Virtual Visits (Telemedicine).  Patients are able to view lab/test results, encounter notes, upcoming appointments, etc.  Non-urgent messages can be sent to your provider as well.   To learn more about what you can do with MyChart, go to [email or web address].    Your next appointment:   6 month(s)  Provider:   You may see Vishnu P Mallipeddi, MD or one of the following Advanced Practice Providers on your designated Care Team:   [place], PA-C  Jacolyn Reedy, [place]      Other Instructions

## 2023-10-22 NOTE — Progress Notes (Signed)
Cardiology Office Note  Date: 10/22/2023   ID: Lisa [name], DOB [birth year], MRN [id number]  PCP:  [name], MD  Cardiologist:  Marjo Bicker, MD Electrophysiologist:  None    History of Present Illness: Lisa [name] is a 89 y.o. female known to have moderate to severe aortic valve stenosis in 05/2023, paroxysmal Afib (due to MDS, anemia and thrombocytopenia), HTN, COPD on home oxygen 2L, breast cancer s/p lumpectomy and radiation, history of lung cancer s/p LUL lobectomy and MDS presents to cardiology clinic for follow-up visit. Accompanied by daughter.  She  Patient had multiple hospitalizations at [facility] and [facility] in 2024 for management of COPD exacerbation, encephalopathy from aspiration pneumonitis, NSTEMI with troponin peak 24,000 etc.  Not a candidate for LHC. aspirin was discontinued in this process due to bleeding and anemia.  Currently on palliative care.  She has no symptoms, doing great.  No chest pain, DOE, leg swelling, dizziness, syncope.  Past Medical History:  Diagnosis Date   AAA (abdominal aortic aneurysm) (HCC)    Adenocarcinoma of left lung (HCC) 2006   AF (paroxysmal atrial fibrillation) (HCC)    Aortic stenosis    Arthritis    Asthma    Cancer of breast, female (HCC)    Cancer of lung (HCC)    Dysrhythmia    Hypertension    Hypothyroidism    Invasive ductal carcinoma of left breast (HCC) 1999   Mitral regurgitation    Myocardial infarction (HCC)    Neutropenia (HCC) 06/09/2016   NSTEMI (non-ST elevated myocardial infarction) (HCC) 04/06/2023   Personal history of radiation therapy    Pulmonary hypertension (HCC)    TIA (transient ischemic attack) 02/10/2022    Past Surgical History:  Procedure Laterality Date   BIOPSY  10/23/2020   Procedure: BIOPSY;  Surgeon: Benancio Deeds, MD;  Location: [facility] ENDOSCOPY;  Service: Gastroenterology;;   BREAST BIOPSY     left axillary node dissection   BREAST LUMPECTOMY Left    COLONOSCOPY  03/08/2003    AOZ:HYQMVHQION rectal polyps destroyed with the tip of the snare/Polyps at hepatic flexure, splenic flexure at 35 cm/Left-sided diverticula: unable to retrieve path   COLONOSCOPY  09/12/2008   GEX:BMWUXL rectum and distal sigmoid diminutive polyps/scattered left sided diverticulum. hyperplastic   COLONOSCOPY N/A 03/06/2013   KGM:WNUUVOZ polyp-removed as described above; colonic diverticulosis. hyperplastic polyps. next TCS 03/2018   ESOPHAGOGASTRODUODENOSCOPY (EGD) WITH PROPOFOL N/A 10/23/2020   Procedure: ESOPHAGOGASTRODUODENOSCOPY (EGD) WITH PROPOFOL;  Surgeon: Benancio Deeds, MD;  Location: [facility] ENDOSCOPY;  Service: Gastroenterology;  Laterality: N/A;   FOOT SURGERY     LUNG REMOVAL, PARTIAL     upper lobe    Current Outpatient Medications  Medication Sig Dispense Refill   acetaminophen (TYLENOL) 325 MG tablet Take 1.5-3 tablets (487.5-975 mg total) by mouth every 6 (six) hours as needed for mild pain (or Fever >/= 101). (Patient not taking: Reported on 10/18/2023)     budesonide-formoterol (SYMBICORT) 80-4.5 MCG/ACT inhaler Inhale 2 puffs into the lungs 2 (two) times daily.     Fluticasone-Umeclidin-Vilant (TRELEGY ELLIPTA) 100-62.5-25 MCG/ACT AEPB Inhale 1 puff into the lungs daily. 28 each 0   furosemide (LASIX) 20 MG tablet Take 1 tablet (20 mg total) by mouth daily.     levothyroxine (SYNTHROID) 100 MCG tablet Take 1 tablet (100 mcg total) by mouth daily before breakfast. 30 tablet 1   Magnesium 400 MG CAPS as directed Orally     metoprolol succinate (TOPROL-XL) 25 MG 24 hr tablet  TAKE 1 TABLET DAILY 90 tablet 1   mirtazapine (REMERON) 15 MG tablet Take 1 tablet (15 mg total) by mouth at bedtime. 30 tablet 0   Multiple Vitamin (MULTI-VITAMIN) tablet Take 1 tablet by mouth daily.     OLANZapine (ZYPREXA) 5 MG tablet Take 5 mg by mouth daily. (Patient not taking: Reported on 10/18/2023)     pantoprazole (PROTONIX) 40 MG tablet Take 40 mg by mouth daily.     pravastatin (PRAVACHOL)  40 MG tablet Take 1 tablet (40 mg total) by mouth every evening. 30 tablet 2   traZODone (DESYREL) 100 MG tablet Take 100 mg by mouth at bedtime.     No current facility-administered medications for this visit.   Allergies:  Meloxicam and Micardis hct [telmisartan-hctz]   Social History: The patient  reports that she quit smoking about 31 years ago. Her smoking use included cigarettes. She started smoking about 67 years ago. She has a 26.3 pack-year smoking history. She has never used smokeless tobacco. She reports that she does not drink alcohol and does not use drugs.   Family History: The patient's family history includes Cancer in her mother and sister.   ROS:  Please see the history of present illness. Otherwise, complete review of systems is positive for none.  All other systems are reviewed and negative.   Physical Exam: VS:  Ht 5\' 7"  (1.702 m)   Wt 135 lb (61.2 kg)   BMI 21.14 kg/m , BMI Body mass index is 21.14 kg/m.  Wt Readings from Last 3 Encounters:  10/22/23 135 lb (61.2 kg)  10/18/23 133 lb 2.5 oz (60.4 kg)  08/12/23 151 lb 7.3 oz (68.7 kg)    General: Patient appears comfortable at rest. HEENT: Conjunctiva and lids normal, oropharynx clear with moist mucosa. Neck: Supple, no elevated JVP or carotid bruits, no thyromegaly. Lungs: Clear to auscultation, nonlabored breathing at rest. Cardiac: Regular rate and rhythm, no S3 or significant systolic murmur, no pericardial rub. Abdomen: Soft, nontender, no hepatomegaly, bowel sounds present, no guarding or rebound. Extremities: No pitting edema, distal pulses 2+. Skin: Warm and dry. Musculoskeletal: No kyphosis. Neuropsychiatric: Alert and oriented x3, affect grossly appropriate.  ECG:  An ECG dated 11/20/2022 was personally reviewed today and demonstrated:  NSR and no ST-T changes  Recent Labwork: 07/14/2023: Magnesium 2.1 08/01/2023: TSH 4.130 08/06/2023: B Natriuretic Peptide 353.3 08/15/2023: ALT 12; AST 29; BUN  15; Creatinine, Ser 0.87; Potassium 3.7; Sodium 133 10/18/2023: Hemoglobin 7.9; Platelets 67     Component Value Date/Time   CHOL 145 07/13/2023 0426   TRIG 51 07/13/2023 0426   HDL 49 07/13/2023 0426   CHOLHDL 3.0 07/13/2023 0426   VLDL 10 07/13/2023 0426   LDLCALC 86 07/13/2023 0426    Other Studies Reviewed Today: Echo from 11/04/2022 LVEF 65 to 70% Grade 3 diastolic dysfunction (restrictive) RA and LA severely dilated Mild to moderate MR Moderate to severe aortic valve stenosis  Assessment and Plan:   History of NSTEMI in 2024 with peak troponin 24,000: Medical management recommended.  Not a candidate for LHC.  Not on aspirin due to anemia/thrombocytopenia from MDS.  Continue pravastatin 40 mg nightly.  Paroxysmal A-fib : Continue metoprolol succinate 25 mg once daily, not on systemic AC due to MDS (anemia, thrombocytopenia).  Not a candidate for watchman due to multiple comorbidities and MDS.  Currently in palliative care.  Chronic diastolic heart failure, compensated: Continue p.o. Lasix 20 mg twice daily, refilled today.  Checks weights daily.  Lost 10 pounds in the last 1 year due to multiple hospitalizations.  Moderate to severe aortic valve stenosis in 2024: Compensated, asymptomatic.  Will defer obtaining further echocardiograms unless patient symptomatic.  Currently on palliative care.  Not a candidate for LHC or TAVR due to multiple comorbidities and MDS.  Fusiform infrarenal AAA 4.8 cm in 2024: Not a surgical candidate.  Chronic hypoxic respiratory failure secondary COPD: Follows with PCP.    Medication Adjustments/Labs and Tests Ordered: Current medicines are reviewed at length with the patient today.  Concerns regarding medicines are outlined above.   Tests Ordered: No orders of the defined types were placed in this encounter.   Medication Changes: No orders of the defined types were placed in this encounter.   Disposition:  Follow up  6  months  Signed, Shay Jhaveri Verne Spurr, MD, 10/22/2023 9:13 AM    [facility] [address] [place], [place] [address]

## 2023-10-26 DIAGNOSIS — G7281 Critical illness myopathy: Secondary | ICD-10-CM | POA: Diagnosis not present

## 2023-10-26 DIAGNOSIS — I509 Heart failure, unspecified: Secondary | ICD-10-CM | POA: Diagnosis not present

## 2023-10-26 DIAGNOSIS — J449 Chronic obstructive pulmonary disease, unspecified: Secondary | ICD-10-CM | POA: Diagnosis not present

## 2023-10-26 DIAGNOSIS — D469 Myelodysplastic syndrome, unspecified: Secondary | ICD-10-CM | POA: Diagnosis not present

## 2023-10-26 DIAGNOSIS — I130 Hypertensive heart and chronic kidney disease with heart failure and stage 1 through stage 4 chronic kidney disease, or unspecified chronic kidney disease: Secondary | ICD-10-CM | POA: Diagnosis not present

## 2023-10-26 DIAGNOSIS — N183 Chronic kidney disease, stage 3 unspecified: Secondary | ICD-10-CM | POA: Diagnosis not present

## 2023-11-01 ENCOUNTER — Ambulatory Visit: Payer: Medicare Other | Attending: Nurse Practitioner | Admitting: Audiology

## 2023-11-03 DIAGNOSIS — G7281 Critical illness myopathy: Secondary | ICD-10-CM | POA: Diagnosis not present

## 2023-11-03 DIAGNOSIS — J449 Chronic obstructive pulmonary disease, unspecified: Secondary | ICD-10-CM | POA: Diagnosis not present

## 2023-11-03 DIAGNOSIS — D469 Myelodysplastic syndrome, unspecified: Secondary | ICD-10-CM | POA: Diagnosis not present

## 2023-11-03 DIAGNOSIS — I509 Heart failure, unspecified: Secondary | ICD-10-CM | POA: Diagnosis not present

## 2023-11-03 DIAGNOSIS — I130 Hypertensive heart and chronic kidney disease with heart failure and stage 1 through stage 4 chronic kidney disease, or unspecified chronic kidney disease: Secondary | ICD-10-CM | POA: Diagnosis not present

## 2023-11-03 DIAGNOSIS — N183 Chronic kidney disease, stage 3 unspecified: Secondary | ICD-10-CM | POA: Diagnosis not present

## 2023-11-09 ENCOUNTER — Ambulatory Visit: Payer: Medicare Other | Admitting: Internal Medicine

## 2023-11-11 DIAGNOSIS — I252 Old myocardial infarction: Secondary | ICD-10-CM | POA: Diagnosis not present

## 2023-11-11 DIAGNOSIS — R350 Frequency of micturition: Secondary | ICD-10-CM | POA: Diagnosis not present

## 2023-11-11 DIAGNOSIS — I5022 Chronic systolic (congestive) heart failure: Secondary | ICD-10-CM | POA: Diagnosis not present

## 2023-11-11 DIAGNOSIS — G7281 Critical illness myopathy: Secondary | ICD-10-CM | POA: Diagnosis not present

## 2023-11-11 DIAGNOSIS — I509 Heart failure, unspecified: Secondary | ICD-10-CM | POA: Diagnosis not present

## 2023-11-11 DIAGNOSIS — I110 Hypertensive heart disease with heart failure: Secondary | ICD-10-CM | POA: Diagnosis not present

## 2023-11-11 DIAGNOSIS — F5105 Insomnia due to other mental disorder: Secondary | ICD-10-CM | POA: Diagnosis not present

## 2023-11-11 DIAGNOSIS — I130 Hypertensive heart and chronic kidney disease with heart failure and stage 1 through stage 4 chronic kidney disease, or unspecified chronic kidney disease: Secondary | ICD-10-CM | POA: Diagnosis not present

## 2023-11-11 DIAGNOSIS — E039 Hypothyroidism, unspecified: Secondary | ICD-10-CM | POA: Diagnosis not present

## 2023-11-11 DIAGNOSIS — N183 Chronic kidney disease, stage 3 unspecified: Secondary | ICD-10-CM | POA: Diagnosis not present

## 2023-11-11 DIAGNOSIS — R627 Adult failure to thrive: Secondary | ICD-10-CM | POA: Diagnosis not present

## 2023-11-11 DIAGNOSIS — D469 Myelodysplastic syndrome, unspecified: Secondary | ICD-10-CM | POA: Diagnosis not present

## 2023-11-11 DIAGNOSIS — J449 Chronic obstructive pulmonary disease, unspecified: Secondary | ICD-10-CM | POA: Diagnosis not present

## 2023-11-11 DIAGNOSIS — Z853 Personal history of malignant neoplasm of breast: Secondary | ICD-10-CM | POA: Diagnosis not present

## 2023-11-11 DIAGNOSIS — Z85110 Personal history of malignant carcinoid tumor of bronchus and lung: Secondary | ICD-10-CM | POA: Diagnosis not present

## 2023-11-11 DIAGNOSIS — H9193 Unspecified hearing loss, bilateral: Secondary | ICD-10-CM | POA: Diagnosis not present

## 2023-11-11 DIAGNOSIS — I480 Paroxysmal atrial fibrillation: Secondary | ICD-10-CM | POA: Diagnosis not present

## 2023-11-12 ENCOUNTER — Ambulatory Visit: Payer: Medicare Other | Admitting: Internal Medicine

## 2023-11-15 ENCOUNTER — Inpatient Hospital Stay: Payer: Medicare Other

## 2023-11-16 ENCOUNTER — Inpatient Hospital Stay: Payer: Medicare Other

## 2023-11-16 ENCOUNTER — Inpatient Hospital Stay: Payer: Medicare Other | Attending: Hematology

## 2023-11-16 VITALS — BP 101/53 | HR 73 | Temp 98.0°F | Resp 18

## 2023-11-16 DIAGNOSIS — D469 Myelodysplastic syndrome, unspecified: Secondary | ICD-10-CM

## 2023-11-16 DIAGNOSIS — G4700 Insomnia, unspecified: Secondary | ICD-10-CM | POA: Diagnosis not present

## 2023-11-16 DIAGNOSIS — Z79899 Other long term (current) drug therapy: Secondary | ICD-10-CM | POA: Diagnosis not present

## 2023-11-16 DIAGNOSIS — C9310 Chronic myelomonocytic leukemia not having achieved remission: Secondary | ICD-10-CM | POA: Diagnosis not present

## 2023-11-16 DIAGNOSIS — D539 Nutritional anemia, unspecified: Secondary | ICD-10-CM

## 2023-11-16 DIAGNOSIS — R531 Weakness: Secondary | ICD-10-CM | POA: Diagnosis not present

## 2023-11-16 DIAGNOSIS — R0602 Shortness of breath: Secondary | ICD-10-CM | POA: Diagnosis not present

## 2023-11-16 DIAGNOSIS — D4620 Refractory anemia with excess of blasts, unspecified: Secondary | ICD-10-CM | POA: Diagnosis not present

## 2023-11-16 DIAGNOSIS — F02818 Dementia in other diseases classified elsewhere, unspecified severity, with other behavioral disturbance: Secondary | ICD-10-CM | POA: Diagnosis not present

## 2023-11-16 DIAGNOSIS — R451 Restlessness and agitation: Secondary | ICD-10-CM | POA: Diagnosis not present

## 2023-11-16 DIAGNOSIS — I5032 Chronic diastolic (congestive) heart failure: Secondary | ICD-10-CM | POA: Diagnosis not present

## 2023-11-16 LAB — CBC
HCT: 25.6 % — ABNORMAL LOW (ref 36.0–46.0)
Hemoglobin: 7.6 g/dL — ABNORMAL LOW (ref 12.0–15.0)
MCH: 29.8 pg (ref 26.0–34.0)
MCHC: 29.7 g/dL — ABNORMAL LOW (ref 30.0–36.0)
MCV: 100.4 fL — ABNORMAL HIGH (ref 80.0–100.0)
Platelets: 73 10*3/uL — ABNORMAL LOW (ref 150–400)
RBC: 2.55 MIL/uL — ABNORMAL LOW (ref 3.87–5.11)
RDW: 20.2 % — ABNORMAL HIGH (ref 11.5–15.5)
WBC: 4 10*3/uL (ref 4.0–10.5)
nRBC: 15.9 % — ABNORMAL HIGH (ref 0.0–0.2)

## 2023-11-16 LAB — SAMPLE TO BLOOD BANK

## 2023-11-16 MED ORDER — DARBEPOETIN ALFA 200 MCG/0.4ML IJ SOSY
200.0000 ug | PREFILLED_SYRINGE | Freq: Once | INTRAMUSCULAR | Status: AC
Start: 2023-11-16 — End: 2023-11-16
  Administered 2023-11-16: 200 ug via SUBCUTANEOUS
  Filled 2023-11-16: qty 0.4

## 2023-11-16 NOTE — Patient Instructions (Signed)
 [facility] [place] - A DEPT OF [facility]  Discharge Instructions: Thank you for choosing [facility] to provide your oncology and hematology care.  If you have a lab appointment with the Cancer Center - please note that after April 8th, 2024, all labs will be drawn in the cancer center.  You do not have to check in or register with the main entrance as you have in the past but will complete your check-in in the cancer center.  Wear comfortable clothing and clothing appropriate for easy access to any Portacath or PICC line.   We strive to give you quality time with your provider. You may need to reschedule your appointment if you arrive late (15 or more minutes).  Arriving late affects you and other patients whose appointments are after yours.  Also, if you miss three or more appointments without notifying the office, you may be dismissed from the clinic at the provider's discretion.      For prescription refill requests, have your pharmacy contact our office and allow 72 hours for refills to be completed.    Today you received the following aranesp , return as scheduled.   To help prevent nausea and vomiting after your treatment, we encourage you to take your nausea medication as directed.  BELOW ARE SYMPTOMS THAT SHOULD BE REPORTED IMMEDIATELY: *FEVER GREATER THAN 100.4 F (38 C) OR HIGHER *CHILLS OR SWEATING *NAUSEA AND VOMITING THAT IS NOT CONTROLLED WITH YOUR NAUSEA MEDICATION *UNUSUAL SHORTNESS OF BREATH *UNUSUAL BRUISING OR BLEEDING *URINARY PROBLEMS (pain or burning when urinating, or frequent urination) *BOWEL PROBLEMS (unusual diarrhea, constipation, pain near the anus) TENDERNESS IN MOUTH AND THROAT WITH OR WITHOUT PRESENCE OF ULCERS (sore throat, sores in mouth, or a toothache) UNUSUAL RASH, SWELLING OR PAIN  UNUSUAL VAGINAL DISCHARGE OR ITCHING   Items with * indicate a potential emergency and should be followed up as soon as possible or  go to the Emergency Department if any problems should occur.  Please show the CHEMOTHERAPY ALERT CARD or IMMUNOTHERAPY ALERT CARD at check-in to the Emergency Department and triage nurse.  Should you have questions after your visit or need to cancel or reschedule your appointment, please contact [facility] [place] - A DEPT OF [facility] [phone]  and follow the prompts.  Office hours are 8:00 a.m. to 4:30 p.m. Monday - Friday. Please note that voicemails left after 4:00 p.m. may not be returned until the following business day.  We are closed weekends and major holidays. You have access to a nurse at all times for urgent questions. Please call the main number to the clinic [phone] and follow the prompts.  For any non-urgent questions, you may also contact your provider using MyChart. We now offer e-Visits for anyone 63 and older to request care online for non-urgent symptoms. For details visit [email or web address].   Also download the MyChart app! Go to the app store, search MyChart, open the app, select , and log in with your MyChart username and password.

## 2023-11-16 NOTE — Progress Notes (Signed)
Patient presents today for Aranesp injection. Hemoglobin reviewed prior to administration. VSS. Injection tolerated without incident or complaint. Patient stable during and after injection. See [name] for details. Patient discharged in satisfactory condition with no s/s of distress noted. 

## 2023-11-22 ENCOUNTER — Encounter (HOSPITAL_COMMUNITY): Payer: Self-pay

## 2023-11-22 ENCOUNTER — Emergency Department (HOSPITAL_COMMUNITY)
Admission: EM | Admit: 2023-11-22 | Discharge: 2023-11-22 | Disposition: A | Discharge status: Home / Self Care | Payer: Medicare Other | Attending: Emergency Medicine | Admitting: Emergency Medicine

## 2023-11-22 ENCOUNTER — Other Ambulatory Visit: Payer: Self-pay

## 2023-11-22 DIAGNOSIS — I509 Heart failure, unspecified: Secondary | ICD-10-CM | POA: Insufficient documentation

## 2023-11-22 DIAGNOSIS — Z79899 Other long term (current) drug therapy: Secondary | ICD-10-CM | POA: Diagnosis not present

## 2023-11-22 DIAGNOSIS — J449 Chronic obstructive pulmonary disease, unspecified: Secondary | ICD-10-CM | POA: Diagnosis not present

## 2023-11-22 DIAGNOSIS — N179 Acute kidney failure, unspecified: Secondary | ICD-10-CM

## 2023-11-22 DIAGNOSIS — R011 Cardiac murmur, unspecified: Secondary | ICD-10-CM | POA: Insufficient documentation

## 2023-11-22 DIAGNOSIS — E875 Hyperkalemia: Secondary | ICD-10-CM | POA: Diagnosis not present

## 2023-11-22 DIAGNOSIS — N3000 Acute cystitis without hematuria: Secondary | ICD-10-CM

## 2023-11-22 DIAGNOSIS — N189 Chronic kidney disease, unspecified: Secondary | ICD-10-CM | POA: Diagnosis not present

## 2023-11-22 DIAGNOSIS — E871 Hypo-osmolality and hyponatremia: Secondary | ICD-10-CM | POA: Diagnosis not present

## 2023-11-22 DIAGNOSIS — R339 Retention of urine, unspecified: Secondary | ICD-10-CM | POA: Diagnosis present

## 2023-11-22 DIAGNOSIS — E039 Hypothyroidism, unspecified: Secondary | ICD-10-CM | POA: Diagnosis not present

## 2023-11-22 LAB — URINALYSIS, ROUTINE W REFLEX MICROSCOPIC
Bilirubin Urine: NEGATIVE
Glucose, UA: NEGATIVE mg/dL
Ketones, ur: NEGATIVE mg/dL
Nitrite: NEGATIVE
Protein, ur: NEGATIVE mg/dL
Specific Gravity, Urine: 1.005 (ref 1.005–1.030)
pH: 6 (ref 5.0–8.0)

## 2023-11-22 LAB — CBC
HCT: 28.9 % — ABNORMAL LOW (ref 36.0–46.0)
Hemoglobin: 8.7 g/dL — ABNORMAL LOW (ref 12.0–15.0)
MCH: 29.5 pg (ref 26.0–34.0)
MCHC: 30.1 g/dL (ref 30.0–36.0)
MCV: 98 fL (ref 80.0–100.0)
Platelets: 48 10*3/uL — ABNORMAL LOW (ref 150–400)
RBC: 2.95 MIL/uL — ABNORMAL LOW (ref 3.87–5.11)
RDW: 21.2 % — ABNORMAL HIGH (ref 11.5–15.5)
WBC: 5.3 10*3/uL (ref 4.0–10.5)
nRBC: 37.8 % — ABNORMAL HIGH (ref 0.0–0.2)

## 2023-11-22 LAB — BASIC METABOLIC PANEL
Anion gap: 9 (ref 5–15)
BUN: 15 mg/dL (ref 8–23)
CO2: 22 mmol/L (ref 22–32)
Calcium: 8.9 mg/dL (ref 8.9–10.3)
Chloride: 97 mmol/L — ABNORMAL LOW (ref 98–111)
Creatinine, Ser: 1.32 mg/dL — ABNORMAL HIGH (ref 0.44–1.00)
GFR, Estimated: 39 mL/min — ABNORMAL LOW (ref >60)
Glucose, Bld: 88 mg/dL (ref 70–99)
Potassium: 5.9 mmol/L — ABNORMAL HIGH (ref 3.5–5.1)
Sodium: 128 mmol/L — ABNORMAL LOW (ref 135–145)

## 2023-11-22 MED ORDER — CEFUROXIME AXETIL 500 MG PO TABS: 250.0000 mg | ORAL_TABLET | Freq: Two times a day (BID) | ORAL | 0 refills | Status: AC

## 2023-11-22 MED ORDER — CEFUROXIME AXETIL 500 MG PO TABS: 500.0000 mg | ORAL_TABLET | Freq: Two times a day (BID) | ORAL | 0 refills | Status: DC

## 2023-11-22 MED ORDER — SODIUM CHLORIDE 0.9 % IV BOLUS
500.0000 mL | Freq: Once | INTRAVENOUS | Status: AC
  Administered 2023-11-22: 500 mL via INTRAVENOUS

## 2023-11-22 MED ORDER — CEFUROXIME AXETIL 250 MG PO TABS
250.0000 mg | ORAL_TABLET | Freq: Once | ORAL | Status: AC
  Administered 2023-11-22: 250 mg via ORAL
  Filled 2023-11-22: qty 1

## 2023-11-22 MED ORDER — SODIUM ZIRCONIUM CYCLOSILICATE 10 G PO PACK
10.0000 g | PACK | Freq: Once | ORAL | Status: AC
  Administered 2023-11-22: 10 g via ORAL
  Filled 2023-11-22: qty 1

## 2023-11-22 NOTE — Discharge Instructions (Addendum)
You were seen in the emergency department for your urinary symptoms.  It did appear that you have a urinary tract infection and we have given you a course of antibiotics he should complete this as prescribed.  Your labs also showed that your kidney function was slightly increased, your potassium was slightly increased and your sodium levels were slightly low.  We did give you some fluids in the emergency department as well as a medication called Lokelma to lower your potassium and you should have these labs rechecked by your primary doctor within 1 week.  You should make sure that you are drinking plenty of fluids and continue taking your home medications as prescribed including your home Lasix.  You should return to the emergency department if you have fevers despite the antibiotics, vomiting and are unable to tolerate the antibiotics, you are having increased dehydration, you pass out or if you have any other new or concerning symptoms.

## 2023-11-22 NOTE — ED Provider Notes (Signed)
[place] EMERGENCY DEPARTMENT AT [facility] Provider Note   CSN: [id number] Arrival date & time: 11/22/23  1359     History  Chief Complaint  Patient presents with   Urinary Retention    LEOCADIA KOCINSKI is a 89 y.o. female.  Patient is a 89 year old female with a past medical history of COPD on 2 L nasal cannula, CHF, hypothyroidism, CKD, myelodysplastic syndrome presenting to the emergency department with urinary frequency and urgency.  Patient is here with her daughter who reports that she has been having urinary symptoms for about the last 2 weeks that have been getting worse.  She was seen by her primary doctor and gave a urine sample but they did not hear back so they came to the ER today.  The patient states that she has had urinary frequency and dysuria but only a small amount of urine comes out at a time and she is current concerned she could be retaining urine.  She denies any fever or back pain.  She states that she has had a decreased appetite and has not been wanting to drink much water but has had no nausea or vomiting.  She has otherwise been taking her home medications as prescribed.  The history is provided by the patient and a relative.       Home Medications Prior to Admission medications   Medication Sig Start Date End Date Taking? Authorizing Provider  cefUROXime (CEFTIN) 500 MG tablet Take 1 tablet (500 mg total) by mouth 2 (two) times daily with a meal for 7 days. 11/22/23 11/29/23 Yes Elayne Snare K, DO  acetaminophen (TYLENOL) 325 MG tablet Take 1.5-3 tablets (487.5-975 mg total) by mouth every 6 (six) hours as needed for mild pain (or Fever >/= 101). 07/15/23   Johnson, Clanford L, MD  budesonide-formoterol (SYMBICORT) 80-4.5 MCG/ACT inhaler Inhale 2 puffs into the lungs 2 (two) times daily.    [provider]  Fluticasone-Umeclidin-Vilant (TRELEGY ELLIPTA) 100-62.5-25 MCG/ACT AEPB Inhale 1 puff into the lungs daily. 12/18/22   Oretha Milch, MD  furosemide (LASIX) 20 MG tablet Take 1 tablet (20 mg total) by mouth 2 (two) times daily. 10/22/23   Mallipeddi, Vishnu P, MD  levothyroxine (SYNTHROID) 100 MCG tablet Take 1 tablet (100 mcg total) by mouth daily before breakfast. 08/28/21   Cleora Fleet, MD  Magnesium 400 MG CAPS as directed Orally    [provider]  metoprolol succinate (TOPROL-XL) 25 MG 24 hr tablet TAKE 1 TABLET DAILY 02/18/23   Mallipeddi, Vishnu P, MD  mirtazapine (REMERON) 15 MG tablet Take 1 tablet (15 mg total) by mouth at bedtime. 06/11/23   Setzer, Lynnell Jude, PA-C  Multiple Vitamin (MULTI-VITAMIN) tablet Take 1 tablet by mouth daily.    [provider]  OLANZapine (ZYPREXA) 5 MG tablet Take 5 mg by mouth daily. 10/14/23   [provider]  pantoprazole (PROTONIX) 40 MG tablet Take 40 mg by mouth daily.    [provider]  pravastatin (PRAVACHOL) 40 MG tablet Take 1 tablet (40 mg total) by mouth every evening. 07/15/23   Johnson, Clanford L, MD  traZODone (DESYREL) 100 MG tablet Take 100 mg by mouth at bedtime. 10/14/23   [provider]      Allergies    Meloxicam and Micardis hct [telmisartan-hctz]    Review of Systems   Review of Systems  Physical Exam Updated Vital Signs BP 122/64   Pulse 83   Temp 98.2 F (36.8  C) (Oral)   Resp 18   Ht 5\' 7"  (1.702 m)   Wt 61 kg   SpO2 95%   BMI 21.06 kg/m  Physical Exam Vitals and nursing note reviewed.  Constitutional:      General: She is not in acute distress.    Appearance: Normal appearance.  HENT:     Head: Normocephalic and atraumatic.     Nose: Nose normal.     Mouth/Throat:     Mouth: Mucous membranes are moist.  Eyes:     Extraocular Movements: Extraocular movements intact.     Conjunctiva/sclera: Conjunctivae normal.  Cardiovascular:     Rate and Rhythm: Normal rate and regular rhythm.     Heart sounds: Murmur (Systolic) heard.  Pulmonary:     Effort: Pulmonary effort is normal.      Breath sounds: Normal breath sounds.  Abdominal:     General: Abdomen is flat.     Palpations: Abdomen is soft.     Tenderness: There is abdominal tenderness (Suprapubic). There is no right CVA tenderness or left CVA tenderness.  Musculoskeletal:        General: Normal range of motion.     Cervical back: Normal range of motion.  Skin:    General: Skin is warm and dry.  Neurological:     General: No focal deficit present.     Mental Status: She is alert and oriented to person, place, and time.  Psychiatric:        Mood and Affect: Mood normal.     ED Results / Procedures / Treatments   Labs (all labs ordered are listed, but only abnormal results are displayed) Labs Reviewed  URINALYSIS, ROUTINE W REFLEX MICROSCOPIC - Abnormal; Notable for the following components:      Result Value   Hgb urine dipstick SMALL (*)    Leukocytes,Ua TRACE (*)    Bacteria, UA RARE (*)    All other components within normal limits  BASIC METABOLIC PANEL - Abnormal; Notable for the following components:   Sodium 128 (*)    Potassium 5.9 (*)    Chloride 97 (*)    Creatinine, Ser 1.32 (*)    GFR, Estimated 39 (*)    All other components within normal limits  CBC - Abnormal; Notable for the following components:   RBC 2.95 (*)    Hemoglobin 8.7 (*)    HCT 28.9 (*)    RDW 21.2 (*)    Platelets 48 (*)    nRBC 37.8 (*)    All other components within normal limits    EKG None  Radiology No results found.  Procedures Procedures    Medications Ordered in ED Medications  cefUROXime (CEFTIN) tablet 500 mg (has no administration in time range)  sodium chloride 0.9 % bolus 500 mL (500 mLs Intravenous New Bag/Given 11/22/23 1543)  sodium zirconium cyclosilicate (LOKELMA) packet 10 g (10 g Oral Given 11/22/23 1550)    ED Course/ Medical Decision Making/ A&P Clinical Course as of 11/22/23 1554  Mon Nov 22, 2023  1534 Bladder scan 218, no hyperkalemic changes on EKG. [VK]    Clinical Course  User Index [VK] Rexford Maus, DO                                 Medical Decision Making This patient presents to the ED with chief complaint(s) of urinary frequency and urgency with pertinent past medical  history of COPD, myelodysplastic syndrome, CKD, hypothyroidism which further complicates the presenting complaint. The complaint involves an extensive differential diagnosis and also carries with it a high risk of complications and morbidity.    The differential diagnosis includes urinary retention, UTI, cystitis, hematuria, AKI, dehydration, electrolyte abnormality, no signs of sepsis on exam  Additional history obtained: Additional history obtained from family Records reviewed outpatient cardiology and oncology records  ED Course and Reassessment: On patient's arrival she was hemodynamically stable in no acute distress.  She was initially evaluated in triage and had labs and urine performed.  Labs did show hyponatremia with a mild AKI, creatinine 1.3 from baseline of normal and hyperkalemia with a potassium of 5.9.  Will have EKG to evaluate for any hyperkalemic changes.  She will be given a fluid bolus and Lokelma for temporization.  Patient has anemia and thrombocytopenia approximately at her baseline.  Urine does have trace leuks and rare bacteria though in the setting of her symptoms would recommend treatment for UTI.  She additionally will have bladder scan performed to evaluate for any urinary retention.  Independent labs interpretation:  The following labs were independently interpreted: mild hyperkalemia and hyponatremia, mild increased Cr from baseline, UA positive for UTI  Independent visualization of imaging: - N/A  Consultation: - Consulted or discussed management/test interpretation w/ external professional: N/A  Consideration for admission or further workup: Patient has no emergent conditions requiring admission or further work-up at this time and is stable for  discharge home with primary care follow-up  Social Determinants of health: n/A    Amount and/or Complexity of Data Reviewed Labs: ordered.  Risk Prescription drug management.          Final Clinical Impression(s) / ED Diagnoses Final diagnoses:  Acute cystitis without hematuria  Hyperkalemia  Hyponatremia  AKI (acute kidney injury) (HCC)    Rx / DC Orders ED Discharge Orders          Ordered    cefUROXime (CEFTIN) 500 MG tablet  2 times daily with meals        11/22/23 1553              Rexford Maus, DO 11/22/23 1554

## 2023-11-22 NOTE — ED Triage Notes (Signed)
Pt c/o urinary retention, pain with urination and urinary frequency for 2 days. Pt states only a small amount of urine is coming out at a time.

## 2023-11-23 NOTE — Progress Notes (Signed)
Name: ANNALIAH RIVENBARK DOB: [birth year] MRN: [id number]  History of Present Illness: Ms. Biancardi is a 89 y.o. female who presents today as a new patient at [facility] [place]. All available relevant medical records have been reviewed. She is accompanied by her daughter Steward Drone, who assists with providing history.  Recent history: > 08/01/2023: CT abdomen/pelvis w/ contrast showed no GU stones, masses, or hydronephrosis; bladder unremarkable.  > 11/22/2023:  - Seen in ER for urinary urgency & frequency.  - PVR = 218 ml on bladder scan. - Urine microscopy: 6-10 WBC/hpf, 0-5 RBC/hpf, rare bacteria. No urine culture done. - Ceftin prescribed for possible UTI.  Today: She reports chief complaint of mixed urinary incontinence.  She reports urge incontinence. She reports stress incontinence with cough/laugh/sneeze. She reports the UUI is predominant.  She leaks multiple times per day. Wears 4-5 diapers per day, which are saturated (even when lined with a pad). She reports urinary incontinence is significantly bothersome.   She reports urinary frequency, nocturia, and urgency. Voiding "every 2 hours" during the day and at least 2x/night on average.   Taking Lasix 20 mg twice daily for CHF.  She reports prior attempted treatment for these symptoms including Tolterodine years ago, which she reports was helpful at that time.  She denies significant caffeine intake (occasional cup of coffee; no tea or soda).  She reports intermittent dysuria. Denies gross hematuria, straining to void, or sensations of incomplete emptying.  Reports bothersome vulvovaginal dryness.   Reports 2 UTIs in the past 12 months (including the one for which she just finished antibiotics yesterday).    Fall Screening: Do you usually have a device to assist in your mobility? Yes - wheelchair  Medications: Current Outpatient Medications  Medication Sig Dispense Refill   acetaminophen (TYLENOL) 325 MG  tablet Take 1.5-3 tablets (487.5-975 mg total) by mouth every 6 (six) hours as needed for mild pain (or Fever >/= 101).     budesonide-formoterol (SYMBICORT) 80-4.5 MCG/ACT inhaler Inhale 2 puffs into the lungs 2 (two) times daily.     estradiol (ESTRACE) 0.1 MG/GM vaginal cream Discard plastic applicator. Insert a blueberry size amount (approximately 1 gram) of cream on fingertip inside vagina at bedtime every night for 1 week then every other night. For long term use. 30 g 3   Fluticasone-Umeclidin-Vilant (TRELEGY ELLIPTA) 100-62.5-25 MCG/ACT AEPB Inhale 1 puff into the lungs daily. 28 each 0   furosemide (LASIX) 20 MG tablet Take 1 tablet (20 mg total) by mouth 2 (two) times daily. 180 tablet 3   levothyroxine (SYNTHROID) 100 MCG tablet Take 1 tablet (100 mcg total) by mouth daily before breakfast. 30 tablet 1   Magnesium 400 MG CAPS as directed Orally     metoprolol succinate (TOPROL-XL) 25 MG 24 hr tablet TAKE 1 TABLET DAILY 90 tablet 1   mirtazapine (REMERON) 15 MG tablet Take 1 tablet (15 mg total) by mouth at bedtime. 30 tablet 0   Multiple Vitamin (MULTI-VITAMIN) tablet Take 1 tablet by mouth daily.     OLANZapine (ZYPREXA) 5 MG tablet Take 5 mg by mouth daily.     pantoprazole (PROTONIX) 40 MG tablet Take 40 mg by mouth daily.     pravastatin (PRAVACHOL) 40 MG tablet Take 1 tablet (40 mg total) by mouth every evening. 30 tablet 2   traZODone (DESYREL) 100 MG tablet Take 100 mg by mouth at bedtime.     Vibegron (GEMTESA) 75 MG TABS Take 1 tablet (75 mg total) by  mouth daily.     No current facility-administered medications for this visit.    Allergies: Allergies  Allergen Reactions   Meloxicam Other (See Comments)    Caused an injury to the kidneys, per nephrologist   Micardis Hct [Telmisartan-Hctz] Other (See Comments)    Caused an injury to the kidneys, per nephrologist    Past Medical History:  Diagnosis Date   AAA (abdominal aortic aneurysm) (HCC)    Adenocarcinoma of left  lung (HCC) 2006   AF (paroxysmal atrial fibrillation) (HCC)    Aortic stenosis    Arthritis    Asthma    Cancer of breast, female (HCC)    Cancer of lung (HCC)    Dysrhythmia    Hypertension    Hypothyroidism    Invasive ductal carcinoma of left breast (HCC) 1999   Mitral regurgitation    Myocardial infarction (HCC)    Neutropenia (HCC) 06/09/2016   NSTEMI (non-ST elevated myocardial infarction) (HCC) 04/06/2023   Personal history of radiation therapy    Pulmonary hypertension (HCC)    TIA (transient ischemic attack) 02/10/2022   Past Surgical History:  Procedure Laterality Date   BIOPSY  10/23/2020   Procedure: BIOPSY;  Surgeon: Benancio Deeds, MD;  Location: [facility];  Service: Gastroenterology;;   BREAST BIOPSY     left axillary node dissection   BREAST LUMPECTOMY Left    COLONOSCOPY  03/08/2003   [id number] rectal polyps destroyed with the tip of the snare/Polyps at hepatic flexure, splenic flexure at 35 cm/Left-sided diverticula: unable to retrieve path   COLONOSCOPY  09/12/2008   JXB:JYNWGN rectum and distal sigmoid diminutive polyps/scattered left sided diverticulum. hyperplastic   COLONOSCOPY N/A 03/06/2013   FAO:ZHYQMVH polyp-removed as described above; colonic diverticulosis. hyperplastic polyps. next TCS 03/2018   ESOPHAGOGASTRODUODENOSCOPY (EGD) WITH PROPOFOL N/A 10/23/2020   Procedure: ESOPHAGOGASTRODUODENOSCOPY (EGD) WITH PROPOFOL;  Surgeon: Benancio Deeds, MD;  Location: [facility] ENDOSCOPY;  Service: Gastroenterology;  Laterality: N/A;   FOOT SURGERY     LUNG REMOVAL, PARTIAL     upper lobe   Family History  Problem Relation Age of Onset   Cancer Mother    Cancer Sister    Colon cancer Neg Hx    Social History   Socioeconomic History   Marital status: Widowed    Spouse name: Not on file   Number of children: Not on file   Years of education: Not on file   Highest education level: Not on file  Occupational History   Not on file  Tobacco  Use   Smoking status: Former    Current packs/day: 0.00    Average packs/day: 0.8 packs/day for 35.0 years (26.3 ttl pk-yrs)    Types: Cigarettes    Start date: 59    Quit date: 1    Years since quitting: 32.0   Smokeless tobacco: Never   Tobacco comments:    smoke-free X 30 yeras  Vaping Use   Vaping status: Never Used  Substance and Sexual Activity   Alcohol use: No   Drug use: No   Sexual activity: Not Currently  Other Topics Concern   Not on file  Social History Narrative   Not on file   Social Drivers of Health   Financial Resource Strain: Not on file  Food Insecurity: No Food Insecurity (08/27/2023)   Received from [facility]   Hunger Vital Sign    Worried About Running Out of Food in the Last Year: Never true    Ran Out of  Food in the Last Year: Never true  Transportation Needs: No Transportation Needs (08/27/2023)   Received from [facility] - Transportation    Lack of Transportation (Medical): No    Lack of Transportation (Non-Medical): No  Physical Activity: Inactive (12/12/2020)   Exercise Vital Sign    Days of Exercise per Week: 0 days    Minutes of Exercise per Session: 0 min  Stress: No Stress Concern Present (08/27/2023)   Received from [facility] - Occupational Stress Questionnaire    Feeling of Stress : Not at all  Social Connections: Unknown (08/26/2023)   Received from [facility]   Social Network    Social Network: Not on file  Intimate Partner Violence: Not At Risk (08/27/2023)   Received from [facility]   HITS    Over the last 12 months how often did your partner physically hurt you?: Never    Over the last 12 months how often did your partner insult you or talk down to you?: Never    Over the last 12 months how often did your partner threaten you with physical harm?: Never    Over the last 12 months how often did your partner scream or curse at you?: Never     SUBJECTIVE  Review of Systems Constitutional: Patient denies any unintentional weight loss or change in strength lntegumentary: Patient denies any rashes or pruritus Cardiovascular: Patient denies chest pain or syncope Respiratory: Patient denies shortness of breath Gastrointestinal: Patient denies nausea, vomiting, constipation, or diarrhea Musculoskeletal: Patient denies muscle cramps or weakness Neurologic: Patient denies convulsions or seizures Allergic/Immunologic: Patient denies recent allergic reaction(s) Hematologic/Lymphatic: Patient denies bleeding tendencies Endocrine: Patient denies heat/cold intolerance  GU: As per HPI.  OBJECTIVE Vitals:   12/01/23 0958  BP: 111/74  Pulse: 91   There is no height or weight on file to calculate BMI.  Physical Examination Constitutional: No obvious distress; patient is non-toxic appearing  Cardiovascular: No visible lower extremity edema.  Respiratory: The patient does not have audible wheezing/stridor; respirations do not appear labored  Gastrointestinal: Abdomen non-distended Musculoskeletal: Normal ROM of UEs  Skin: No obvious rashes/open sores  Neurologic: CN 2-12 grossly intact Psychiatric: Answered questions appropriately with normal affect  Hematologic/Lymphatic/Immunologic: No obvious bruises or sites of spontaneous bleeding  Urine microscopy: negative  PVR: 76 ml  ASSESSMENT Mixed stress and urge urinary incontinence - Plan: Urinalysis, Routine w reflex microscopic, BLADDER SCAN AMB NON-IMAGING  Recurrent UTI - Plan: estradiol (ESTRACE) 0.1 MG/GM vaginal cream  Atrophic vaginitis - Plan: estradiol (ESTRACE) 0.1 MG/GM vaginal cream  OAB (overactive bladder) - Plan: Vibegron (GEMTESA) 75 MG TABS  Urge incontinence - Plan: Vibegron (GEMTESA) 75 MG TABS  Stress incontinence, female  Functional incontinence  Ambulatory dysfunction  Urinary urgency - Plan: Vibegron (GEMTESA) 75 MG TABS  Urinary frequency -  Plan: Vibegron (GEMTESA) 75 MG TABS  Nocturia - Plan: Vibegron (GEMTESA) 75 MG TABS  We discussed the different forms of urinary incontinence, such as stress and urge incontinence, and described how symptoms are consistent with mixed urinary incontinence (urge predominant). We agreed to prioritize urge incontinence / OAB at this time.   We discussed evidence of overactive bladder (OAB) with urinary urgency, frequency, nocturia, and urge incontinence. Exacerbated by functional incontinence due to ambulatory dysfunction. We discussed the following management options in detail including potential benefits, risks, and side effects: Behavioral therapy: Modify fluid intake Decreasing bladder irritants (such as caffeine) Urge suppression  strategies Bladder retraining / timed voiding Double voiding Medication(s): - Not a safe candidate for anticholinergic medications due to risk for side effects based on patient's age and comorbidities.  - For beta-3 agonist medication, we discussed the risk for urinary retention and the potential side effect of elevated blood pressure specific to Myrbetriq (which is more likely to occur in individuals with uncontrolled hypertension).   She decided to proceed with trial of Gemtesa 75 mg daily and to work on behavioral modifications including continued minimal caffeine intake and working on timed voiding / bladder retraining.  2. Recurrent UTI. We discussed the possible etiologies of recurrent UTls including ascending infection related to intercourse; vaginal atrophy; transmural infection that has been treated incompletely; urinary tract stones; incomplete bladder emptying with urinary stasis; kidney or bladder tumor; urethral diverticulum; and colonization of  vagina and urinary tract with pathologic, adherent organisms.    We discussed the difference between urinalysis (urine dipstick test) vs. urine culture and why urine cultures are needed to determine appropriate  diagnosis and treatment of urologic symptoms.  For UTI prevention we discussed the following options, for which detailed information has been included in Patient Information section of today's After Visit Summary. > Maintain adequate fluid intake daily to flush out the urinary tract. > Go to the bathroom to urinate every 4-6 hours while awake to minimize urinary stasis / bacterial overgrowth in the bladder. > Proanthocyanidin (PAC) supplement 36 mg daily; must be soluble (insoluble form of PAC will be ineffective). Recommend Ellura.  > D-mannose 2 g daily to minimize bacterial adherence in urinary tract > Vitamin C supplement to acidify urine to suppress bacterial growth > Probiotic to maintain healthy vaginal microbiome > Topical vaginal estrogen for symptomatic vaginal atrophy.  We discussed the following: - The etiology and consequences of urogenital epithelial atrophy was explained to patient. The thinning of the epithelium of the urethra can contribute to urinary urgency and frequency syndromes. In addition, the normal bacterial flora that colonizes the perineum may contribute to UTI risk because the thin urethral epithelium allows the bacteria to become adherent and the change in vaginal pH can disrupt the vaginal / urethral microbiome and allow for bacterial overgrowth. - The normal bacterial flora that colonizes the perineum may contribute to UTI risk because the thin urethral epithelium allows the bacteria to become adherent and the change in vaginal pH can disrupt the vaginal / urethral microbiome and allow for bacterial overgrowth.  - Topical vaginal estrogen replacement will take about 3 months to restore the vaginal pH. - Topical vaginal replacement may sting/burn initially due to severe dryness, which will improve with ongoing treatment as the tissue gets healthier.  - OK to have sex with any of the topical vaginal estrogen replacement options.  - There have been studies that evaluate  use of low-dose intravaginal estrogen cream that shows minimal systemic absorption that is negligible after 3 weeks. There have been no studies indicating that use of topical vaginal estrogen increases a patient's risk of contributing to breast cancer development or recurrence.  Patient ultimately decided to start with topical vaginal estrogen cream for UTI prevention.  We agreed to plan for follow up in 6 weeks or sooner if needed. Patient verbalized understanding of and agreement with current plan. All questions were answered.  PLAN Advised the following: 1. Start Gemtesa 75 mg daily (samples provided). 2. Start topical vaginal estrogen cream nightly x1 week then every other night long term. 3. Continue minimal caffeine intake. 4. Work  on timed voiding / bladder retraining. 5. Return in about 6 weeks (around 01/12/2024) for UA, PVR, & f/u with Evette Georges NP.  Orders Placed This Encounter  Procedures   Urinalysis, Routine w reflex microscopic   BLADDER SCAN AMB NON-IMAGING    It has been explained that the patient is to follow regularly with their PCP in addition to all other providers involved in their care and to follow instructions provided by these respective offices. Patient advised to contact urology clinic if any urologic-pertaining questions, concerns, new symptoms or problems arise in the interim period.  Patient Instructions  UTI prevention / management:  Difference between Urinalysis (urine dipstick test) and Urine culture:  Urinalysis (urine dipstick test): A quick office test used as an indicator to determine whether or not further testing is necessary (such as a urine culture, urine microscopy, etc.) The urinalysis cannot differentiate a true bacterial UTI or give a definitive diagnosis for the findings.  Urine culture: May be performed based on the findings of a urinalysis to evaluate for UTI. Grows out on a petri dish for 48-72 hours. Provides important  information about: whether or not bacterial growth is present and if so: what the predominant bacteria is which antibiotics will work best against that bacteria That information is important so that we can diagnose and treat patients appropriately as there are other conditions which may mimic UTls which must not be missed (such as cancer, interstitial cystitis, stones, etc.). Assists [place] with antibiotic stewardship to minimize patient's risk for developing antibiotic resistance (getting to a point where no antibiotics work anymore).  Options when UTI symptoms occur: 1. Call [facility] [place] and request to speak with triage nurse (phone # [phone], select option 3). In accordance with clinic guidelines the nurse will determine next steps based on patient-reported symptoms, which may include: same-day lab visit to provide urine specimen, recommendation to schedule Urology office visit appointment for further evaluation, recommendation to proceed to ER, etc. 2. Call your Primary Care Provider (PCP) office to request urgent / same-day visit. Be sure to request for urine culture to be ordered and have results faxed to Urology (fax # [phone]).  3. Go to urgent care. Be sure to request for urine culture to be ordered and have results faxed to Urology (fax # [phone]).   For bladder pain/ burning with urination: - Can take over-the-counter Pyridium (phenazopyridine; commonly known under the "AZO" brand) for a few days as needed. Limit use to no more than 3 days consecutively due to risk for methemoglobinemia, liver function issues, and bone health damage with long term use of Pyridium. - Alternative: Prescription urinary analgesics (such as Uribel, Urogesic blue, Urelle, Uro-MP). Often expensive / poorly covered by insurance unfortunately.  Options / recommendations for UTI prevention: - Topical vaginal estrogen for vaginal atrophy (aka Genitourinary Syndrome of Menopause  (GSM)). - Adequate daily fluid intake to flush out the urinary tract. - Go to the bathroom to urinate every 4-6 hours while awake to minimize urinary stasis / bacterial overgrowth in the bladder. - Proanthocyanidin (PAC) supplement 36 mg daily; must be soluble (insoluble form of PAC will be ineffective). Recommended brand: Ellura. This is an over-the-counter supplement (often must be found/ purchased online) supplement derived from cranberries with concentrated active component: Proanthocyanidin (PAC) 36 mg daily. Decreases bacterial adherence to bladder lining.  - D-mannose powder (2 grams daily). This is an over-the-counter supplement which decreases bacterial adherence to bladder lining (it is a sugar that inhibits bacterial  adherence to urothelial cells by binding to the pili of enteric bacteria). Take as per manufacturer recommendation. Can be used as an alternative or in addition to the concentrated cranberry supplement.  - Vitamin C supplement to acidify urine to minimize bacterial growth.  - Probiotic to maintain healthy vaginal microbiome to suppress bacteria at urethral opening. Brand recommendations: Darrold Junker (includes probiotic & D-mannose ), Feminine Balance (highest concentration of lactobacillus) or Hyperbiotic Pro 15.  Note for patients with diabetes:  - Be aware that D-mannose contains sugar.  Note for patients with interstitial cystitis (IC):  - Patients with IC should typically avoid cranberry/ PAC supplements and Vitamin C supplements due to their acidity, which may exacerbate IC-related bladder pain. - Symptoms of true bacterial UTI can overlap / mimic symptoms of an IC flare up. Antibiotic use is NOT indicated for IC flare ups. Urine culture needed prior to antibiotic treatment for IC patients. The goal is to minimize your risk for developing antibiotic-resistant bacteria.    Vaginal atrophy I Genitourinary syndrome of menopause (GSM):  What it is: Changes in the vaginal  environment (including the vulva and urethra) including: Thinning of the epithelium (skin/ mucosa surface) Can contribute to urinary urgency and frequency Can contribute to dryness, itching, irritation of the vulvar and vaginal tissue Can contribute to pain with intercourse Can contribute to physical changes of the labia, vulva, and vagina such as: Narrowing of the vaginal opening Decreased vaginal length Loss of labial architecture Labial adhesions Pale color of vulvovaginal tissue  Loss of pubic hair Allows bacteria to become adherent  Results in increased risk for urinary tract infection (UTI) due to bacterial overgrowth and migration up the urethra into the bladder Change in vaginal pH (acid/ base balance) Allows for alteration / disruption of the normal bacterial flora / microbiome Results in increased risk for urinary tract infection (UTI) due to bacterial overgrowth  Treatment options: Over-the-counter lubricants (see list below). Prescription vaginal estrogen replacement. Options: Topical vaginal estrogen cream Estrace, Premarin, or compounded estradiol cream/ gel We advise: Discard plastic applicator as that tends to use more medication than you need, which is not harmful but wastes / uses up the medication. Also the plastic applicator may cause discomfort. Insert blueberry size amount of medication via the tip of your finger inside vagina nightly for 1 week then 2-3 times per week (long term). Estring vaginal ring Exchanged every 3 months (either at home or in office by provider) Vagifem vaginal tablet Inserted nightly for 2 weeks then twice a week (long term) lntrarosa vaginal suppository Vaginal DHEA: converts to estrogen in vaginal tissue without systemic effect Inserted nightly (long term) Vaginal laser therapy (Mona Lisa touch) Performed in 3 treatments each 6 weeks apart (available in our [place] office). Can feel like a sunburn for 3-4 days after each treatment  until new skin heals in. Usually not covered by insurance. Estimated cost is $1500 for all 3 sessions.  FYI regarding prescription vaginal estrogen treatment options: All topical vaginal estrogen replacement options are equivalent in terms of efficacy. Topical vaginal estrogen replacement will take about 3 months to be effective. OK to have sex with any of the topical vaginal estrogen replacement options. Topical vaginal estrogen replacement may sting/burn initially due to severe dryness, which will improve with ongoing treatment. There have been studies that evaluate use of low-dose intravaginal estrogen that show minimal systemic absorption which is negligible after 3 weeks. There have been no studies indicating increased risk of contributing to cancer development or recurrence.  Topical vaginal estrogen cream safe to use with breast cancer history [email or web address]  Topical vaginal estrogen cream safe to use with blood clot history [email or web address]   Lubricants and Moisturizers for Treating Genitourinary Syndrome of Menopause and Vulvovaginal Atrophy Treatment Comments I Available Products   Lubricants   Water-based Ingredients: Deionized water, glycerin, propylene glycol; latex safe; rare irritation; dry out with extended sexual activity Astroglide, Good Clean Love, K-Y Jelly, Natural, Organic, Pink, Sliquid, Sylk, Yes    Oil Based Ingredients: avocado, olive, peanut, corn; latex safe; can be used with silicone products; staining; safe (unless peanut allergy); non-irritating Coconut oil, vegetable oil, vitamin E oil  Silicone-Based Ingredients: Silicone polymers; staining; typically nonirritating, long lasting; waterproof; should not be used  with silicone dilators, sexual toys, or gynecologic products Astroglide X, Oceanus Ultra Pure, Pink Silicone, Pjur Eros, Replens Silky Smooth, Silicone Premium JO, SKYN, Uberlube, [employer] Based Minimize harm to sperm motility; designed Astroglide TTC, Conceive Plus, Pre for couples trying to conceive Seed, Yes Baby  Fertility Friendly Minimize harm to sperm motility; designed Astroglide, TTC, Conceive Plus, Pre for couples trying to conceive Seed, Yes Baby  Vaginal Moisturizers   Vaginal Moisturizers For maintenance use 1 to 3 times weekly; can benefit women with dryness, chafing with AOL, and recurrent vaginal infections irrespective of sexual activity timing Balance Active Menopause Vaginal Moisturizing Lubricant, Canesintima Intimate Moisturizer, Replens, Rephresh, Sylk Natural Intimate Moisturizer, Yes Vaginal Moisturizer  Hybrids Properties of both water and silicone-based products (combination of a vaginal lubricant and moisturizer); Non-irritating; good option for women with allergies and sensitivities Lubrigyn, Luvena  Suppositories Hyaluronic acid to retain moisture Revaree  Vulvar Soothing Creams/Oils    Medicated CreamsP ain and burn relief; Ingredients: 4% Lidocaine, Aloe Vera gel Releveum ([place])  Non-Medicated Creams For anti-itch and moisture/maintenance; Ingredients: Coconut oil, Avocado oil, Shea Butter, Olive oil, Vitamin E Vajuvenate, Vmagic  Oils !For moisture/maintenance !Coconut oil, Vitamin E oil, Emu oil                 Overactive bladder (OAB) overview for patients:  Symptoms may include: urinary urgency ("gotta go" feeling) urinary frequency (voiding >8 times per day) night time urination (nocturia) urge incontinence of urine (UUI)  While we do not know the exact etiology of OAB, several treatment options exist including:  Behavioral therapy: Reducing fluid intake Decreasing bladder stimulants (such as caffeine) and  irritants (such as acidic food, spicy foods, alcohol) Urge suppression strategies Bladder retraining via timed voiding  Pelvic floor physical therapy  Medication(s) - can use one or both of the drug classes below. Anticholinergic / antimuscarinic medications:  Mechanism of action: Activate M3 receptors to reduce detrusor stimulation and increase bladder capacity   (parasympathetic nervous system). Effect: Relaxes the bladder to decrease overactivity, increase bladder storage capacity, and increase time between voids. Onset: Slow acting (may take 8-12 weeks to determine efficacy). Medications include: Vesicare (Solifenacin), Ditropan (Oxybutynin), Detrol (Tolterodine), Toviaz (Fesoterodine), Sanctura (Trospium), Urispas (Flavoxate), Enablex (Darifenacin), Bentyl (Dicyclomine), Levsin (Hyoscyamine ). Potential side effects include but are not limited to: Dry eyes, dry mouth, constipation, cognitive impairment, dementia risk with long term use, and urinary retention/ incomplete bladder emptying. Insurance companies generally prefer for patients to try 1-2 anticholinergic / antimuscarinic medications first due to low cost. Some exceptions are made based on patient-specific comorbidities / risk factors. Beta-3 agonist medications: Mechanism of action: Stimulates selective B3 adrenergic receptors to cause smooth muscle bladder relaxation (sympathetic nervous system). Effect: Relaxes the bladder to decrease overactivity,  increase bladder storage capacity, and increase time between voids. Onset: Slow acting (may take 8-12 weeks to determine efficacy). Medications include: Myrbetriq (Mirabegron) and Vibegron Leslye Peer). Potential side effects include but are not limited to: urinary retention / incomplete bladder emptying and elevated blood pressure (more likely to occur in individuals with pre-existing uncontrolled hypertension). These medications tend to be more expensive than the anticholinergic /  antimuscarinic medications.   For patients with refractory OAB (if the above treatment options have been unsuccessful): Posterior tibial nerve stimulation (PTNS). Small acupuncture-type needle inserted near ankle with electric current to stimulate bladder via posterior tibial nerve pathway. Initially requires 12 weekly in-office treatments lasting 30 minutes each; followed by monthly in-office treatments lasting 30 minutes each for 1 year.  Bladder Botox injections. How it is done: Typically done via in-office cystoscopy; sometimes done in the OR depending on the situation. The bladder is numbed with lidocaine instilled via a catheter. Then the urologist injects Botox into the bladder muscle wall in about 20 locations. Causes local paralysis of the bladder muscle at the injection sites to reduce bladder muscle overactivity / spasms. The effect lasts for approximately 6 months and cannot be reversed once performed. Risks may included but are not limited to: infection, incomplete bladder emptying/ urinary retention, short term need for self-catheterization or indwelling catheter, and need for repeat therapy. There is a 5-12% chance of needing to catheterize with Botox - that usually resolves in a few months as the Botox wears off. Typically Botox injections would need to be repeated every 3-12 months since this is not a permanent therapy.  Sacral neuromodulation trial (Medtronic lnterStim or Axonics implant). Sacral neuromodulation is FDA-approved for uncontrolled urinary urgency, urinary frequency, urinary urge incontinence, non-obstructive urinary retention, or fecal incontinence. It is not FDA-approved as a treatment for pain. The goal of this therapy is at least a 50% improvement in symptoms. It is NOT realistic to expect a 100% cure. This is a a 2-step outpatient procedure. After a successful test period, a permanent wire and generator are placed in the OR. We discussed the risk of infection. We  reviewed the fact that about 30% of patients fail the test phase and are not candidates for permanent generator placement. During the 1-2 week trial phase, symptoms are documented by the patient to determine response. If patient gets at least a 50% improvement in symptoms, they may then proceed with Step 2. Step 1: Trial lead placement. Per physician discretion, may done one of two ways: Percutaneous nerve evaluation (PNE) in the [facility] urology office. Performed by urologist under local anesthesia (numbing the area with lidocaine) using a spinal needle for placement of test wire, which usually stays in place for 5-7 days to determine therapy response. Test lead placement in OR under anesthesia. Usually stays in place 2 weeks to determine therapy response. > Step 2: Permanent implantation of sacral neuromodulation device, which is performed in the OR.  Sacral neuromodulation implants: All are conditionally MRI safe. Manufacturer: Medtronic Website: [email or web address] [email or web address]/right-for-you.[email or web address] Options: lnterStim X: Non-rechargeable. The battery lasts 10 years on average. lnterStim Micro: Rechargeable. The battery lasts 15 years on average and must be charged routinely. Approximately 50% smaller implant than lnterStim X implant.  Manufacturer: Axonics Website: Findrealrelief.axonics[employer]com Options: Non-rechargeable (Axonics F15): The battery lasts 15 years on average. Rechargeable (Axonics R20): The battery lasts 20 years on average and must be charged in office for about 1 hour every 6-10 months on average. Approximately 50% smaller implant than Axonics non-rechargeable implant.  Note: Generally the rechargeable devices are only advised for very small or thin patients who may not have sufficient adipose tissue to comfortably overlay the implanted device.  Suprapubic catheter (SP tube) placement. Only done in severely  refractory OAB when all other options have failed or are not a viable treatment choice depending on patient factors. Involves placement of a catheter through the lower abdomen into the bladder to continuously drain the bladder into an external collection bag, which patient can then empty at their convenience every few hours. Done via an outpatient surgical procedure in the OR under anesthesia. Risks may included but are not limited to: surgical site pain, infections, skin irritation / breakdown, chronic bacteriuria, symptomatic UTls. The SP tube must stay in place continuously. This is a reversible procedure however - the insertion site will close if catheter is removed for more than a few hours. The SP tube must be exchanged routinely every 4 weeks to prevent the catheter from becoming clogged with sediment. SP tube exchanges are typically performed at a urology nurse visit or by a home health nurse.   Electronically signed by:  Donnita Falls, MSN, FNP-C, CUNP 12/01/2023 12:42 PM

## 2023-11-30 DIAGNOSIS — E875 Hyperkalemia: Secondary | ICD-10-CM | POA: Diagnosis not present

## 2023-11-30 DIAGNOSIS — R300 Dysuria: Secondary | ICD-10-CM | POA: Diagnosis not present

## 2023-11-30 DIAGNOSIS — E785 Hyperlipidemia, unspecified: Secondary | ICD-10-CM | POA: Diagnosis not present

## 2023-11-30 DIAGNOSIS — N3090 Cystitis, unspecified without hematuria: Secondary | ICD-10-CM | POA: Diagnosis not present

## 2023-11-30 DIAGNOSIS — N3091 Cystitis, unspecified with hematuria: Secondary | ICD-10-CM | POA: Diagnosis not present

## 2023-11-30 DIAGNOSIS — Z7689 Persons encountering health services in other specified circumstances: Secondary | ICD-10-CM | POA: Diagnosis not present

## 2023-12-01 ENCOUNTER — Ambulatory Visit: Payer: Medicare Other | Admitting: Urology

## 2023-12-01 ENCOUNTER — Encounter: Payer: Self-pay | Admitting: Urology

## 2023-12-01 VITALS — BP 111/74 | HR 91

## 2023-12-01 DIAGNOSIS — R3981 Functional urinary incontinence: Secondary | ICD-10-CM | POA: Insufficient documentation

## 2023-12-01 DIAGNOSIS — N3941 Urge incontinence: Secondary | ICD-10-CM | POA: Insufficient documentation

## 2023-12-01 DIAGNOSIS — N3946 Mixed incontinence: Secondary | ICD-10-CM | POA: Insufficient documentation

## 2023-12-01 DIAGNOSIS — N393 Stress incontinence (female) (male): Secondary | ICD-10-CM | POA: Insufficient documentation

## 2023-12-01 DIAGNOSIS — N3281 Overactive bladder: Secondary | ICD-10-CM

## 2023-12-01 DIAGNOSIS — N952 Postmenopausal atrophic vaginitis: Secondary | ICD-10-CM | POA: Diagnosis not present

## 2023-12-01 DIAGNOSIS — Z87440 Personal history of urinary (tract) infections: Secondary | ICD-10-CM | POA: Diagnosis not present

## 2023-12-01 DIAGNOSIS — R262 Difficulty in walking, not elsewhere classified: Secondary | ICD-10-CM | POA: Insufficient documentation

## 2023-12-01 DIAGNOSIS — R350 Frequency of micturition: Secondary | ICD-10-CM | POA: Diagnosis not present

## 2023-12-01 DIAGNOSIS — R32 Unspecified urinary incontinence: Secondary | ICD-10-CM | POA: Diagnosis not present

## 2023-12-01 DIAGNOSIS — R3915 Urgency of urination: Secondary | ICD-10-CM

## 2023-12-01 DIAGNOSIS — R351 Nocturia: Secondary | ICD-10-CM

## 2023-12-01 DIAGNOSIS — N390 Urinary tract infection, site not specified: Secondary | ICD-10-CM | POA: Insufficient documentation

## 2023-12-01 LAB — URINALYSIS, ROUTINE W REFLEX MICROSCOPIC
Bilirubin, UA: NEGATIVE
Glucose, UA: NEGATIVE
Ketones, UA: NEGATIVE
Nitrite, UA: NEGATIVE
Protein,UA: NEGATIVE
Specific Gravity, UA: 1.01 (ref 1.005–1.030)
Urobilinogen, Ur: 0.2 mg/dL (ref 0.2–1.0)
pH, UA: 7 (ref 5.0–7.5)

## 2023-12-01 LAB — MICROSCOPIC EXAMINATION: Bacteria, UA: NONE SEEN

## 2023-12-01 LAB — BLADDER SCAN AMB NON-IMAGING: Scan Result: 76

## 2023-12-01 MED ORDER — ESTRADIOL 0.1 MG/GM VA CREA: TOPICAL_CREAM | VAGINAL | 3 refills | Status: DC

## 2023-12-01 MED ORDER — GEMTESA 75 MG PO TABS: 1.0000 | ORAL_TABLET | Freq: Every day | ORAL | Status: DC

## 2023-12-01 NOTE — Patient Instructions (Signed)
UTI prevention / management:  Difference between Urinalysis (urine dipstick test) and Urine culture:  Urinalysis (urine dipstick test): A quick office test used as an indicator to determine whether or not further testing is necessary (such as a urine culture, urine microscopy, etc.) The urinalysis cannot differentiate a true bacterial UTI or give a definitive diagnosis for the findings.  Urine culture: May be performed based on the findings of a urinalysis to evaluate for UTI. Grows out on a petri dish for 48-72 hours. Provides important information about: whether or not bacterial growth is present and if so: what the predominant bacteria is which antibiotics will work best against that bacteria That information is important so that we can diagnose and treat patients appropriately as there are other conditions which may mimic UTls which must not be missed (such as cancer, interstitial cystitis, stones, etc.). Assists [place] with antibiotic stewardship to minimize patient's risk for developing antibiotic resistance (getting to a point where no antibiotics work anymore).  Options when UTI symptoms occur: 1. Call [facility] [place] and request to speak with triage nurse (phone # [phone], select option 3). In accordance with clinic guidelines the nurse will determine next steps based on patient-reported symptoms, which may include: same-day lab visit to provide urine specimen, recommendation to schedule Urology office visit appointment for further evaluation, recommendation to proceed to ER, etc. 2. Call your Primary Care Provider (PCP) office to request urgent / same-day visit. Be sure to request for urine culture to be ordered and have results faxed to Urology (fax # [phone]).  3. Go to urgent care. Be sure to request for urine culture to be ordered and have results faxed to Urology (fax # [phone]).   For bladder pain/ burning with urination: - Can take over-the-counter  Pyridium (phenazopyridine; commonly known under the "AZO" brand) for a few days as needed. Limit use to no more than 3 days consecutively due to risk for methemoglobinemia, liver function issues, and bone health damage with long term use of Pyridium. - Alternative: Prescription urinary analgesics (such as Uribel, Urogesic blue, Urelle, Uro-MP). Often expensive / poorly covered by insurance unfortunately.  Options / recommendations for UTI prevention: - Topical vaginal estrogen for vaginal atrophy (aka Genitourinary Syndrome of Menopause (GSM)). - Adequate daily fluid intake to flush out the urinary tract. - Go to the bathroom to urinate every 4-6 hours while awake to minimize urinary stasis / bacterial overgrowth in the bladder. - Proanthocyanidin (PAC) supplement 36 mg daily; must be soluble (insoluble form of PAC will be ineffective). Recommended brand: Ellura. This is an over-the-counter supplement (often must be found/ purchased online) supplement derived from cranberries with concentrated active component: Proanthocyanidin (PAC) 36 mg daily. Decreases bacterial adherence to bladder lining.  - D-mannose powder (2 grams daily). This is an over-the-counter supplement which decreases bacterial adherence to bladder lining (it is a sugar that inhibits bacterial adherence to urothelial cells by binding to the pili of enteric bacteria). Take as per manufacturer recommendation. Can be used as an alternative or in addition to the concentrated cranberry supplement.  - Vitamin C supplement to acidify urine to minimize bacterial growth.  - Probiotic to maintain healthy vaginal microbiome to suppress bacteria at urethral opening. Brand recommendations: Darrold Junker (includes probiotic & D-mannose ), Feminine Balance (highest concentration of lactobacillus) or Hyperbiotic Pro 15.  Note for patients with diabetes:  - Be aware that D-mannose contains sugar.  Note for patients with interstitial cystitis (IC):  -  Patients with IC should  typically avoid cranberry/ PAC supplements and Vitamin C supplements due to their acidity, which may exacerbate IC-related bladder pain. - Symptoms of true bacterial UTI can overlap / mimic symptoms of an IC flare up. Antibiotic use is NOT indicated for IC flare ups. Urine culture needed prior to antibiotic treatment for IC patients. The goal is to minimize your risk for developing antibiotic-resistant bacteria.    Vaginal atrophy I Genitourinary syndrome of menopause (GSM):  What it is: Changes in the vaginal environment (including the vulva and urethra) including: Thinning of the epithelium (skin/ mucosa surface) Can contribute to urinary urgency and frequency Can contribute to dryness, itching, irritation of the vulvar and vaginal tissue Can contribute to pain with intercourse Can contribute to physical changes of the labia, vulva, and vagina such as: Narrowing of the vaginal opening Decreased vaginal length Loss of labial architecture Labial adhesions Pale color of vulvovaginal tissue Loss of pubic hair Allows bacteria to become adherent  Results in increased risk for urinary tract infection (UTI) due to bacterial overgrowth and migration up the urethra into the bladder Change in vaginal pH (acid/ base balance) Allows for alteration / disruption of the normal bacterial flora / microbiome Results in increased risk for urinary tract infection (UTI) due to bacterial overgrowth  Treatment options: Over-the-counter lubricants (see list below). Prescription vaginal estrogen replacement. Options: Topical vaginal estrogen cream Estrace, Premarin, or compounded estradiol cream/ gel We advise: Discard plastic applicator as that tends to use more medication than you need, which is not harmful but wastes / uses up the medication. Also the plastic applicator may cause discomfort. Insert blueberry size amount of medication via the tip of your finger inside vagina  nightly for 1 week then 2-3 times per week (long term). Estring vaginal ring Exchanged every 3 months (either at home or in office by provider) Vagifem vaginal tablet Inserted nightly for 2 weeks then twice a week (long term) lntrarosa vaginal suppository Vaginal DHEA: converts to estrogen in vaginal tissue without systemic effect Inserted nightly (long term) Vaginal laser therapy (Mona Lisa touch) Performed in 3 treatments each 6 weeks apart (available in our [place] office). Can feel like a sunburn for 3-4 days after each treatment until new skin heals in. Usually not covered by insurance. Estimated cost is $1500 for all 3 sessions.  FYI regarding prescription vaginal estrogen treatment options: All topical vaginal estrogen replacement options are equivalent in terms of efficacy. Topical vaginal estrogen replacement will take about 3 months to be effective. OK to have sex with any of the topical vaginal estrogen replacement options. Topical vaginal estrogen replacement may sting/burn initially due to severe dryness, which will improve with ongoing treatment. There have been studies that evaluate use of low-dose intravaginal estrogen that show minimal systemic absorption which is negligible after 3 weeks. There have been no studies indicating increased risk of contributing to cancer development or recurrence.  Topical vaginal estrogen cream safe to use with breast cancer history [email or web address]  Topical vaginal estrogen cream safe to use with blood clot history [email or web address]   Lubricants and Moisturizers for Treating Genitourinary Syndrome of Menopause and Vulvovaginal Atrophy Treatment Comments I Available Products   Lubricants    Water-based Ingredients: Deionized water, glycerin, propylene glycol; latex safe; rare irritation; dry out with extended sexual activity Astroglide, Good Clean Love, K-Y Jelly, Natural, Organic, Pink, Sliquid, Sylk, Yes    Oil Based Ingredients: avocado, olive, peanut, corn; latex safe; can be used with silicone products; staining; safe (unless peanut allergy); non-irritating Coconut  oil, vegetable oil, vitamin E oil  Silicone-Based Ingredients: Silicone polymers; staining; typically nonirritating, long lasting; waterproof; should not be used with silicone dilators, sexual toys, or gynecologic products Astroglide X, Oceanus Ultra Pure, Pink Silicone, Pjur Eros, Replens Silky Smooth, Silicone Premium JO, SKYN, Uberlube, [employer] Based Minimize harm to sperm motility; designed Astroglide TTC, Conceive Plus, Pre for couples trying to conceive Seed, Yes Baby  Fertility Friendly Minimize harm to sperm motility; designed Astroglide, TTC, Conceive Plus, Pre for couples trying to conceive Seed, Yes Baby  Vaginal Moisturizers   Vaginal Moisturizers For maintenance use 1 to 3 times weekly; can benefit women with dryness, chafing with AOL, and recurrent vaginal infections irrespective of sexual activity timing Balance Active Menopause Vaginal Moisturizing Lubricant, Canesintima Intimate Moisturizer, Replens, Rephresh, Sylk Natural Intimate Moisturizer, Yes Vaginal Moisturizer  Hybrids Properties of both water and silicone-based products (combination of a vaginal lubricant and moisturizer); Non-irritating; good option for women with allergies and sensitivities Lubrigyn, Luvena  Suppositories Hyaluronic acid to retain moisture Revaree  Vulvar Soothing Creams/Oils    Medicated CreamsP ain and burn relief; Ingredients: 4% Lidocaine, Aloe Vera gel Releveum ([place])  Non-Medicated Creams For anti-itch and moisture/maintenance; Ingredients: Coconut oil, Avocado oil, Shea Butter,  Olive oil, Vitamin E Vajuvenate, Vmagic  Oils !For moisture/maintenance !Coconut oil, Vitamin E oil, Emu oil                 Overactive bladder (OAB) overview for patients:  Symptoms may include: urinary urgency ("gotta go" feeling) urinary frequency (voiding >8 times per day) night time urination (nocturia) urge incontinence of urine (UUI)  While we do not know the exact etiology of OAB, several treatment options exist including:  Behavioral therapy: Reducing fluid intake Decreasing bladder stimulants (such as caffeine) and irritants (such as acidic food, spicy foods, alcohol) Urge suppression strategies Bladder retraining via timed voiding  Pelvic floor physical therapy  Medication(s) - can use one or both of the drug classes below. Anticholinergic / antimuscarinic medications:  Mechanism of action: Activate M3 receptors to reduce detrusor stimulation and increase bladder capacity  (parasympathetic nervous system). Effect: Relaxes the bladder to decrease overactivity, increase bladder storage capacity, and increase time between voids. Onset: Slow acting (may take 8-12 weeks to determine efficacy). Medications include: Vesicare (Solifenacin), Ditropan (Oxybutynin), Detrol (Tolterodine), Toviaz (Fesoterodine), Sanctura (Trospium), Urispas (Flavoxate), Enablex (Darifenacin), Bentyl (Dicyclomine), Levsin (Hyoscyamine ). Potential side effects include but are not limited to: Dry eyes, dry mouth, constipation, cognitive impairment, dementia risk with long term use, and urinary retention/ incomplete bladder emptying. Insurance companies generally prefer for patients to try 1-2 anticholinergic / antimuscarinic medications first due to low cost. Some exceptions are made based on patient-specific comorbidities / risk factors. Beta-3 agonist medications: Mechanism of action: Stimulates selective B3 adrenergic receptors to cause smooth muscle bladder relaxation (sympathetic nervous  system). Effect: Relaxes the bladder to decrease overactivity, increase bladder storage capacity, and increase time between voids. Onset: Slow acting (may take 8-12 weeks to determine efficacy). Medications include: Myrbetriq (Mirabegron) and Vibegron Leslye Peer). Potential side effects include but are not limited to: urinary retention / incomplete bladder emptying and elevated blood pressure (more likely to occur in individuals with pre-existing uncontrolled hypertension). These medications tend to be more expensive than the anticholinergic / antimuscarinic medications.   For patients with refractory OAB (if the above treatment options have been unsuccessful): Posterior tibial nerve stimulation (PTNS). Small acupuncture-type needle inserted near ankle with electric current to stimulate bladder via posterior tibial nerve  pathway. Initially requires 12 weekly in-office treatments lasting 30 minutes each; followed by monthly in-office treatments lasting 30 minutes each for 1 year.  Bladder Botox injections. How it is done: Typically done via in-office cystoscopy; sometimes done in the OR depending on the situation. The bladder is numbed with lidocaine instilled via a catheter. Then the urologist injects Botox into the bladder muscle wall in about 20 locations. Causes local paralysis of the bladder muscle at the injection sites to reduce bladder muscle overactivity / spasms. The effect lasts for approximately 6 months and cannot be reversed once performed. Risks may included but are not limited to: infection, incomplete bladder emptying/ urinary retention, short term need for self-catheterization or indwelling catheter, and need for repeat therapy. There is a 5-12% chance of needing to catheterize with Botox - that usually resolves in a few months as the Botox wears off. Typically Botox injections would need to be repeated every 3-12 months since this is not a permanent therapy.  Sacral neuromodulation  trial (Medtronic lnterStim or Axonics implant). Sacral neuromodulation is FDA-approved for uncontrolled urinary urgency, urinary frequency, urinary urge incontinence, non-obstructive urinary retention, or fecal incontinence. It is not FDA-approved as a treatment for pain. The goal of this therapy is at least a 50% improvement in symptoms. It is NOT realistic to expect a 100% cure. This is a a 2-step outpatient procedure. After a successful test period, a permanent wire and generator are placed in the OR. We discussed the risk of infection. We reviewed the fact that about 30% of patients fail the test phase and are not candidates for permanent generator placement. During the 1-2 week trial phase, symptoms are documented by the patient to determine response. If patient gets at least a 50% improvement in symptoms, they may then proceed with Step 2. Step 1: Trial lead placement. Per physician discretion, may done one of two ways: Percutaneous nerve evaluation (PNE) in the [facility] urology office. Performed by urologist under local anesthesia (numbing the area with lidocaine) using a spinal needle for placement of test wire, which usually stays in place for 5-7 days to determine therapy response. Test lead placement in OR under anesthesia. Usually stays in place 2 weeks to determine therapy response. > Step 2: Permanent implantation of sacral neuromodulation device, which is performed in the OR.  Sacral neuromodulation implants: All are conditionally MRI safe. Manufacturer: Medtronic Website: [email or web address].[email or web address]/right-for-you.[email or web address] Options: lnterStim X: Non-rechargeable. The battery lasts 10 years on average. lnterStim Micro: Rechargeable. The battery lasts 15 years on average and must be charged routinely. Approximately 50% smaller implant than lnterStim X implant.  Manufacturer: Axonics Website:  Findrealrelief.axonics.com Options: Non-rechargeable (Axonics F15): The battery lasts 15 years on average. Rechargeable (Axonics R20): The battery lasts 20 years on average and must be charged in office for about 1 hour every 6-10 months on average. Approximately 50% smaller implant than Axonics non-rechargeable implant.  Note: Generally the rechargeable devices are only advised for very small or thin patients who may not have sufficient adipose tissue to comfortably overlay the implanted device.  Suprapubic catheter (SP tube) placement. Only done in severely refractory OAB when all other options have failed or are not a viable treatment choice depending on patient factors. Involves placement of a catheter through the lower abdomen into the bladder to continuously drain the bladder into an external collection bag, which patient can then empty at their convenience every few hours. Done via an outpatient surgical procedure in the OR under anesthesia. Risks may included but  are not limited to: surgical site pain, infections, skin irritation / breakdown, chronic bacteriuria, symptomatic UTls. The SP tube must stay in place continuously. This is a reversible procedure however - the insertion site will close if catheter is removed for more than a few hours. The SP tube must be exchanged routinely every 4 weeks to prevent the catheter from becoming clogged with sediment. SP tube exchanges are typically performed at a urology nurse visit or by a home health nurse.

## 2023-12-03 ENCOUNTER — Other Ambulatory Visit: Payer: Self-pay

## 2023-12-03 ENCOUNTER — Inpatient Hospital Stay (HOSPITAL_COMMUNITY)
Admission: EM | Admit: 2023-12-03 | Discharge: 2024-01-01 | DRG: 871 | Disposition: E | Payer: Medicare Other | Attending: Student | Admitting: Student

## 2023-12-03 ENCOUNTER — Emergency Department (HOSPITAL_COMMUNITY): Payer: Medicare Other

## 2023-12-03 DIAGNOSIS — D649 Anemia, unspecified: Secondary | ICD-10-CM | POA: Diagnosis not present

## 2023-12-03 DIAGNOSIS — I080 Rheumatic disorders of both mitral and aortic valves: Secondary | ICD-10-CM | POA: Diagnosis present

## 2023-12-03 DIAGNOSIS — Z87440 Personal history of urinary (tract) infections: Secondary | ICD-10-CM

## 2023-12-03 DIAGNOSIS — I2510 Atherosclerotic heart disease of native coronary artery without angina pectoris: Secondary | ICD-10-CM | POA: Diagnosis present

## 2023-12-03 DIAGNOSIS — Z66 Do not resuscitate: Secondary | ICD-10-CM | POA: Diagnosis not present

## 2023-12-03 DIAGNOSIS — N179 Acute kidney failure, unspecified: Secondary | ICD-10-CM | POA: Diagnosis not present

## 2023-12-03 DIAGNOSIS — T43025A Adverse effect of tetracyclic antidepressants, initial encounter: Secondary | ICD-10-CM | POA: Diagnosis present

## 2023-12-03 DIAGNOSIS — G9341 Metabolic encephalopathy: Secondary | ICD-10-CM | POA: Diagnosis not present

## 2023-12-03 DIAGNOSIS — E861 Hypovolemia: Secondary | ICD-10-CM | POA: Diagnosis present

## 2023-12-03 DIAGNOSIS — I350 Nonrheumatic aortic (valve) stenosis: Secondary | ICD-10-CM | POA: Diagnosis not present

## 2023-12-03 DIAGNOSIS — C3492 Malignant neoplasm of unspecified part of left bronchus or lung: Secondary | ICD-10-CM | POA: Diagnosis not present

## 2023-12-03 DIAGNOSIS — Z515 Encounter for palliative care: Secondary | ICD-10-CM

## 2023-12-03 DIAGNOSIS — Z7189 Other specified counseling: Secondary | ICD-10-CM

## 2023-12-03 DIAGNOSIS — I469 Cardiac arrest, cause unspecified: Secondary | ICD-10-CM | POA: Diagnosis not present

## 2023-12-03 DIAGNOSIS — Z79890 Hormone replacement therapy (postmenopausal): Secondary | ICD-10-CM

## 2023-12-03 DIAGNOSIS — H9190 Unspecified hearing loss, unspecified ear: Secondary | ICD-10-CM | POA: Diagnosis present

## 2023-12-03 DIAGNOSIS — R627 Adult failure to thrive: Secondary | ICD-10-CM | POA: Diagnosis not present

## 2023-12-03 DIAGNOSIS — I252 Old myocardial infarction: Secondary | ICD-10-CM

## 2023-12-03 DIAGNOSIS — R6521 Severe sepsis with septic shock: Secondary | ICD-10-CM | POA: Diagnosis present

## 2023-12-03 DIAGNOSIS — J449 Chronic obstructive pulmonary disease, unspecified: Secondary | ICD-10-CM | POA: Diagnosis present

## 2023-12-03 DIAGNOSIS — C50912 Malignant neoplasm of unspecified site of left female breast: Secondary | ICD-10-CM | POA: Diagnosis not present

## 2023-12-03 DIAGNOSIS — C9310 Chronic myelomonocytic leukemia not having achieved remission: Secondary | ICD-10-CM | POA: Diagnosis present

## 2023-12-03 DIAGNOSIS — G9340 Encephalopathy, unspecified: Secondary | ICD-10-CM | POA: Diagnosis present

## 2023-12-03 DIAGNOSIS — R918 Other nonspecific abnormal finding of lung field: Secondary | ICD-10-CM | POA: Diagnosis not present

## 2023-12-03 DIAGNOSIS — E875 Hyperkalemia: Secondary | ICD-10-CM | POA: Diagnosis present

## 2023-12-03 DIAGNOSIS — A419 Sepsis, unspecified organism: Principal | ICD-10-CM | POA: Diagnosis present

## 2023-12-03 DIAGNOSIS — Z9981 Dependence on supplemental oxygen: Secondary | ICD-10-CM

## 2023-12-03 DIAGNOSIS — J9621 Acute and chronic respiratory failure with hypoxia: Secondary | ICD-10-CM | POA: Diagnosis not present

## 2023-12-03 DIAGNOSIS — Z886 Allergy status to analgesic agent status: Secondary | ICD-10-CM

## 2023-12-03 DIAGNOSIS — I480 Paroxysmal atrial fibrillation: Secondary | ICD-10-CM | POA: Diagnosis not present

## 2023-12-03 DIAGNOSIS — I5032 Chronic diastolic (congestive) heart failure: Secondary | ICD-10-CM | POA: Diagnosis present

## 2023-12-03 DIAGNOSIS — Z79899 Other long term (current) drug therapy: Secondary | ICD-10-CM

## 2023-12-03 DIAGNOSIS — M109 Gout, unspecified: Secondary | ICD-10-CM | POA: Diagnosis present

## 2023-12-03 DIAGNOSIS — E860 Dehydration: Secondary | ICD-10-CM | POA: Diagnosis present

## 2023-12-03 DIAGNOSIS — D539 Nutritional anemia, unspecified: Secondary | ICD-10-CM | POA: Diagnosis present

## 2023-12-03 DIAGNOSIS — Z888 Allergy status to other drugs, medicaments and biological substances status: Secondary | ICD-10-CM

## 2023-12-03 DIAGNOSIS — D61818 Other pancytopenia: Secondary | ICD-10-CM | POA: Diagnosis not present

## 2023-12-03 DIAGNOSIS — J150 Pneumonia due to Klebsiella pneumoniae: Secondary | ICD-10-CM | POA: Diagnosis not present

## 2023-12-03 DIAGNOSIS — E871 Hypo-osmolality and hyponatremia: Secondary | ICD-10-CM | POA: Diagnosis present

## 2023-12-03 DIAGNOSIS — I272 Pulmonary hypertension, unspecified: Secondary | ICD-10-CM | POA: Diagnosis present

## 2023-12-03 DIAGNOSIS — N390 Urinary tract infection, site not specified: Secondary | ICD-10-CM | POA: Diagnosis not present

## 2023-12-03 DIAGNOSIS — J9622 Acute and chronic respiratory failure with hypercapnia: Secondary | ICD-10-CM | POA: Diagnosis not present

## 2023-12-03 DIAGNOSIS — Z8679 Personal history of other diseases of the circulatory system: Secondary | ICD-10-CM

## 2023-12-03 DIAGNOSIS — D469 Myelodysplastic syndrome, unspecified: Secondary | ICD-10-CM | POA: Diagnosis not present

## 2023-12-03 DIAGNOSIS — R4182 Altered mental status, unspecified: Principal | ICD-10-CM

## 2023-12-03 DIAGNOSIS — J9611 Chronic respiratory failure with hypoxia: Secondary | ICD-10-CM | POA: Diagnosis not present

## 2023-12-03 DIAGNOSIS — Z85118 Personal history of other malignant neoplasm of bronchus and lung: Secondary | ICD-10-CM

## 2023-12-03 DIAGNOSIS — I6782 Cerebral ischemia: Secondary | ICD-10-CM | POA: Diagnosis not present

## 2023-12-03 DIAGNOSIS — Z902 Acquired absence of lung [part of]: Secondary | ICD-10-CM

## 2023-12-03 DIAGNOSIS — I130 Hypertensive heart and chronic kidney disease with heart failure and stage 1 through stage 4 chronic kidney disease, or unspecified chronic kidney disease: Secondary | ICD-10-CM | POA: Diagnosis not present

## 2023-12-03 DIAGNOSIS — E039 Hypothyroidism, unspecified: Secondary | ICD-10-CM | POA: Diagnosis present

## 2023-12-03 DIAGNOSIS — Z79810 Long term (current) use of selective estrogen receptor modulators (SERMs): Secondary | ICD-10-CM

## 2023-12-03 DIAGNOSIS — N1831 Chronic kidney disease, stage 3a: Secondary | ICD-10-CM | POA: Diagnosis present

## 2023-12-03 DIAGNOSIS — Z8673 Personal history of transient ischemic attack (TIA), and cerebral infarction without residual deficits: Secondary | ICD-10-CM

## 2023-12-03 DIAGNOSIS — E872 Acidosis, unspecified: Secondary | ICD-10-CM | POA: Diagnosis present

## 2023-12-03 DIAGNOSIS — F0390 Unspecified dementia without behavioral disturbance: Secondary | ICD-10-CM | POA: Diagnosis present

## 2023-12-03 DIAGNOSIS — I10 Essential (primary) hypertension: Secondary | ICD-10-CM | POA: Diagnosis present

## 2023-12-03 DIAGNOSIS — E8721 Acute metabolic acidosis: Secondary | ICD-10-CM | POA: Diagnosis not present

## 2023-12-03 DIAGNOSIS — E43 Unspecified severe protein-calorie malnutrition: Secondary | ICD-10-CM | POA: Insufficient documentation

## 2023-12-03 DIAGNOSIS — Z87891 Personal history of nicotine dependence: Secondary | ICD-10-CM

## 2023-12-03 DIAGNOSIS — Z860100 Personal history of colon polyps, unspecified: Secondary | ICD-10-CM

## 2023-12-03 DIAGNOSIS — J9610 Chronic respiratory failure, unspecified whether with hypoxia or hypercapnia: Secondary | ICD-10-CM | POA: Diagnosis not present

## 2023-12-03 DIAGNOSIS — I517 Cardiomegaly: Secondary | ICD-10-CM | POA: Diagnosis not present

## 2023-12-03 DIAGNOSIS — Z853 Personal history of malignant neoplasm of breast: Secondary | ICD-10-CM

## 2023-12-03 DIAGNOSIS — R531 Weakness: Secondary | ICD-10-CM | POA: Diagnosis not present

## 2023-12-03 DIAGNOSIS — Z7951 Long term (current) use of inhaled steroids: Secondary | ICD-10-CM

## 2023-12-03 DIAGNOSIS — R1310 Dysphagia, unspecified: Secondary | ICD-10-CM | POA: Diagnosis not present

## 2023-12-03 DIAGNOSIS — Z923 Personal history of irradiation: Secondary | ICD-10-CM

## 2023-12-03 DIAGNOSIS — I771 Stricture of artery: Secondary | ICD-10-CM | POA: Diagnosis not present

## 2023-12-03 LAB — CBG MONITORING, ED: Glucose-Capillary: 91 mg/dL (ref 70–99)

## 2023-12-03 LAB — I-STAT VENOUS BLOOD GAS, ED
Acid-base deficit: 1 mmol/L (ref 0.0–2.0)
Acid-base deficit: 3 mmol/L — ABNORMAL HIGH (ref 0.0–2.0)
Bicarbonate: 23.6 mmol/L (ref 20.0–28.0)
Bicarbonate: 23.5 mmol/L (ref 20.0–28.0)
Calcium, Ion: 1.2 mmol/L (ref 1.15–1.40)
Calcium, Ion: 1.27 mmol/L (ref 1.15–1.40)
HCT: 28 % — ABNORMAL LOW (ref 36.0–46.0)
HCT: 26 % — ABNORMAL LOW (ref 36.0–46.0)
Hemoglobin: 9.5 g/dL — ABNORMAL LOW (ref 12.0–15.0)
Hemoglobin: 8.8 g/dL — ABNORMAL LOW (ref 12.0–15.0)
O2 Saturation: 76 %
O2 Saturation: 53 %
Potassium: 6.4 mmol/L (ref 3.5–5.1)
Potassium: 4.9 mmol/L (ref 3.5–5.1)
Sodium: 130 mmol/L — ABNORMAL LOW (ref 135–145)
Sodium: 136 mmol/L (ref 135–145)
TCO2: 25 mmol/L (ref 22–32)
TCO2: 25 mmol/L (ref 22–32)
pCO2, Ven: 36.5 mm[Hg] — ABNORMAL LOW (ref 44–60)
pCO2, Ven: 47.6 mm[Hg] (ref 44–60)
pH, Ven: 7.42 (ref 7.25–7.43)
pH, Ven: 7.302 (ref 7.25–7.43)
pO2, Ven: 40 mm[Hg] (ref 32–45)
pO2, Ven: 31 mm[Hg] — CL (ref 32–45)

## 2023-12-03 LAB — COMPREHENSIVE METABOLIC PANEL
ALT: 12 U/L (ref 0–44)
ALT: 11 U/L (ref 0–44)
AST: 37 U/L (ref 15–41)
AST: 39 U/L (ref 15–41)
Albumin: 3.2 g/dL — ABNORMAL LOW (ref 3.5–5.0)
Albumin: 3.1 g/dL — ABNORMAL LOW (ref 3.5–5.0)
Alkaline Phosphatase: 42 U/L (ref 38–126)
Alkaline Phosphatase: 39 U/L (ref 38–126)
Anion gap: 10 (ref 5–15)
Anion gap: 12 (ref 5–15)
BUN: 32 mg/dL — ABNORMAL HIGH (ref 8–23)
BUN: 33 mg/dL — ABNORMAL HIGH (ref 8–23)
CO2: 22 mmol/L (ref 22–32)
CO2: 21 mmol/L — ABNORMAL LOW (ref 22–32)
Calcium: 9.2 mg/dL (ref 8.9–10.3)
Calcium: 9.7 mg/dL (ref 8.9–10.3)
Chloride: 97 mmol/L — ABNORMAL LOW (ref 98–111)
Chloride: 95 mmol/L — ABNORMAL LOW (ref 98–111)
Creatinine, Ser: 1.67 mg/dL — ABNORMAL HIGH (ref 0.44–1.00)
Creatinine, Ser: 1.63 mg/dL — ABNORMAL HIGH (ref 0.44–1.00)
GFR, Estimated: 29 mL/min — ABNORMAL LOW (ref >60)
GFR, Estimated: 30 mL/min — ABNORMAL LOW (ref >60)
Glucose, Bld: 96 mg/dL (ref 70–99)
Glucose, Bld: 97 mg/dL (ref 70–99)
Potassium: 6.3 mmol/L (ref 3.5–5.1)
Potassium: 5.5 mmol/L — ABNORMAL HIGH (ref 3.5–5.1)
Sodium: 129 mmol/L — ABNORMAL LOW (ref 135–145)
Sodium: 128 mmol/L — ABNORMAL LOW (ref 135–145)
Total Bilirubin: 1.7 mg/dL — ABNORMAL HIGH (ref 0.0–1.2)
Total Bilirubin: 1.2 mg/dL (ref 0.0–1.2)
Total Protein: 9.4 g/dL — ABNORMAL HIGH (ref 6.5–8.1)
Total Protein: 9.3 g/dL — ABNORMAL HIGH (ref 6.5–8.1)

## 2023-12-03 LAB — URINALYSIS, ROUTINE W REFLEX MICROSCOPIC
Bilirubin Urine: NEGATIVE
Glucose, UA: NEGATIVE mg/dL
Ketones, ur: NEGATIVE mg/dL
Nitrite: NEGATIVE
Protein, ur: 100 mg/dL — AB
Specific Gravity, Urine: 1.011 (ref 1.005–1.030)
WBC, UA: >50 WBC/hpf (ref 0–5)
pH: 5 (ref 5.0–8.0)

## 2023-12-03 LAB — CBC
HCT: 28.2 % — ABNORMAL LOW (ref 36.0–46.0)
Hemoglobin: 8.2 g/dL — ABNORMAL LOW (ref 12.0–15.0)
MCH: 29.9 pg (ref 26.0–34.0)
MCHC: 29.1 g/dL — ABNORMAL LOW (ref 30.0–36.0)
MCV: 102.9 fL — ABNORMAL HIGH (ref 80.0–100.0)
Platelets: 41 10*3/uL — ABNORMAL LOW (ref 150–400)
RBC: 2.74 MIL/uL — ABNORMAL LOW (ref 3.87–5.11)
RDW: 21.5 % — ABNORMAL HIGH (ref 11.5–15.5)
WBC: 9.4 10*3/uL (ref 4.0–10.5)
nRBC: 16.1 % — ABNORMAL HIGH (ref 0.0–0.2)

## 2023-12-03 LAB — I-STAT CG4 LACTIC ACID, ED: Lactic Acid, Venous: 0.9 mmol/L (ref 0.5–1.9)

## 2023-12-03 LAB — TROPONIN I (HIGH SENSITIVITY): Troponin I (High Sensitivity): 16 ng/L (ref <18)

## 2023-12-03 MED ORDER — QUETIAPINE FUMARATE 25 MG PO TABS: 25.0000 mg | ORAL_TABLET | Freq: Every evening | ORAL | Status: DC | PRN

## 2023-12-03 MED ORDER — SODIUM CHLORIDE 0.9 % IV SOLN
1.0000 g | INTRAVENOUS | Status: DC
  Administered 2023-12-03: 1 g via INTRAVENOUS
  Filled 2023-12-03: qty 10

## 2023-12-03 MED ORDER — PRAVASTATIN SODIUM 40 MG PO TABS
40.0000 mg | ORAL_TABLET | Freq: Every evening | ORAL | Status: DC
  Administered 2023-12-03 – 2023-12-09 (×7): 40 mg via ORAL
  Filled 2023-12-03 (×7): qty 1

## 2023-12-03 MED ORDER — INSULIN ASPART 100 UNIT/ML IV SOLN
5.0000 [IU] | Freq: Once | INTRAVENOUS | Status: AC
  Administered 2023-12-03: 5 [IU] via INTRAVENOUS

## 2023-12-03 MED ORDER — CALCIUM GLUCONATE 10 % IV SOLN
1.0000 g | Freq: Once | INTRAVENOUS | Status: AC
  Administered 2023-12-03: 1 g via INTRAVENOUS
  Filled 2023-12-03: qty 10

## 2023-12-03 MED ORDER — MIRTAZAPINE 15 MG PO TABS
30.0000 mg | ORAL_TABLET | Freq: Every day | ORAL | Status: DC
  Administered 2023-12-03 – 2023-12-05 (×3): 30 mg via ORAL
  Filled 2023-12-03 (×3): qty 2

## 2023-12-03 MED ORDER — SODIUM ZIRCONIUM CYCLOSILICATE 10 G PO PACK
10.0000 g | PACK | Freq: Every day | ORAL | Status: DC
  Administered 2023-12-03 – 2023-12-04 (×2): 10 g via ORAL
  Filled 2023-12-03 (×2): qty 1

## 2023-12-03 MED ORDER — TRAZODONE HCL 50 MG PO TABS
100.0000 mg | ORAL_TABLET | Freq: Every day | ORAL | Status: DC
  Administered 2023-12-03 – 2023-12-08 (×6): 100 mg via ORAL
  Filled 2023-12-03: qty 2
  Filled 2023-12-03: qty 1
  Filled 2023-12-03 (×3): qty 2
  Filled 2023-12-03: qty 1

## 2023-12-03 MED ORDER — LACTATED RINGERS IV SOLN: INTRAVENOUS | Status: DC

## 2023-12-03 MED ORDER — ALBUTEROL SULFATE (2.5 MG/3ML) 0.083% IN NEBU
10.0000 mg | INHALATION_SOLUTION | Freq: Once | RESPIRATORY_TRACT | Status: AC
  Administered 2023-12-03: 10 mg via RESPIRATORY_TRACT
  Filled 2023-12-03: qty 12

## 2023-12-03 MED ORDER — METOPROLOL SUCCINATE ER 25 MG PO TB24
25.0000 mg | ORAL_TABLET | Freq: Every day | ORAL | Status: DC
  Administered 2023-12-04: 25 mg via ORAL
  Filled 2023-12-03: qty 1

## 2023-12-03 MED ORDER — OLANZAPINE 5 MG PO TABS
5.0000 mg | ORAL_TABLET | Freq: Every day | ORAL | Status: DC
  Administered 2023-12-04: 5 mg via ORAL
  Filled 2023-12-03: qty 1
  Filled 2023-12-03: qty 2

## 2023-12-03 MED ORDER — DEXTROSE 50 % IV SOLN
1.0000 | Freq: Once | INTRAVENOUS | Status: AC
  Administered 2023-12-03: 50 mL via INTRAVENOUS
  Filled 2023-12-03: qty 50

## 2023-12-03 MED ORDER — SODIUM CHLORIDE 0.9% FLUSH
3.0000 mL | Freq: Two times a day (BID) | INTRAVENOUS | Status: DC
  Administered 2023-12-03 – 2023-12-10 (×12): 3 mL via INTRAVENOUS

## 2023-12-03 NOTE — H&P (Signed)
History and Physical   Lisa [name] LKG:[id number] DOB: Jul 26, 1934 DOA: 12/03/2023  PCP: [name], MD   Patient coming from: Home  Chief Complaint: Altered mental status  HPI: Lisa [name] is a 89 y.o. female with medical history significant of hypertension, hypothyroidism, paroxysmal atrial fibrillation, CKD 3A, hyperkalemia, anemia, gout, diastolic CHF, CAD, breast cancer, lung cancer, MDS, CML, pancytopenia, aortic stenosis, COPD, chronic respiratory failure with hypoxia, AAA presenting with worsening mental status.  History obtained assistance of chart review due to patient's altered status.  Daughter reports patient has not been wearing oxygen at home for 1 week.  For the past days she has had worsening altered mental status and started saying things that did not make sense.  Patient noted to be weaker today and started to fall when daughter caught her.  Patient recently treated for UTI with 710 on 1/20 which she completed.  She followed up with urology on 1/29 and urinalysis at that time showed 1+ leukocytes but no bacteria nor WBCs.  Daughter also reports patient has had decreased appetite and decreased p.o. intake.  Patient denies fevers, chills, chest pain, shortness of breath, constipation, diarrhea, nausea, vomiting.  She remains somewhat altered but history does seem reliable.  ED Course: Vital signs in the ED notable for blood pressure in the 90s to 120s systolic, heart rate in the 80s to 120s, respiratory rate in the 20s, requiring 3 L to maintain saturations.  Lab workup included CMP with sodium 129, chloride 97, potassium 6.3, BUN 32, creatinine elevated to 1.67 from baseline of 1, protein 9.4, albumin 3.2, T. bili 1.7.  CBC with hemoglobin stable at 8.2, platelets decreased mildly from baseline of 41, WBC 9.4 up from leukopenia baseline of 4.  Lactic acid normal.  Troponin normal.  Blood cultures pending.  Urinalysis with hemoglobin, protein, leukocytes, bacteria.  VBG  with normal pH and pCO2 36.5.  Chest x-ray showed cardiomegaly,?  Central vascular congestion and questionable effusion.  CT head showed no acute normality and stable chronic changes.  Patient received insulin, dextrose, calcium gluconate, albumin, Lokelma in the ED.  EDP discussed case with nephrology by phone who recommended to continue with temporizing measures and continue with doses of Lokelma.  Repeat i-STAT showed improvement in potassium in the ED.  Review of Systems: As per HPI otherwise all other systems reviewed and are negative.  Past Medical History:  Diagnosis Date   AAA (abdominal aortic aneurysm) (HCC)    Adenocarcinoma of left lung (HCC) 2006   AF (paroxysmal atrial fibrillation) (HCC)    Aortic stenosis    Arthritis    Asthma    Cancer of breast, female (HCC)    Cancer of lung (HCC)    Dysrhythmia    Hypertension    Hypothyroidism    Invasive ductal carcinoma of left breast (HCC) 1999   Mitral regurgitation    Myocardial infarction (HCC)    Neutropenia (HCC) 06/09/2016   NSTEMI (non-ST elevated myocardial infarction) (HCC) 04/06/2023   Personal history of radiation therapy    Pulmonary hypertension (HCC)    TIA (transient ischemic attack) 02/10/2022    Past Surgical History:  Procedure Laterality Date   BIOPSY  10/23/2020   Procedure: BIOPSY;  Surgeon: Benancio Deeds, MD;  Location: [facility] ENDOSCOPY;  Service: Gastroenterology;;   BREAST BIOPSY     left axillary node dissection   BREAST LUMPECTOMY Left    COLONOSCOPY  03/08/2003   GUY:[id number] rectal polyps destroyed with the tip of  the snare/Polyps at hepatic flexure, splenic flexure at 35 cm/Left-sided diverticula: unable to retrieve path   COLONOSCOPY  09/12/2008   WUJ:WJXBJY rectum and distal sigmoid diminutive polyps/scattered left sided diverticulum. hyperplastic   COLONOSCOPY N/A 03/06/2013   NWG:NFAOZHY polyp-removed as described above; colonic diverticulosis. hyperplastic polyps. next TCS 03/2018    ESOPHAGOGASTRODUODENOSCOPY (EGD) WITH PROPOFOL N/A 10/23/2020   Procedure: ESOPHAGOGASTRODUODENOSCOPY (EGD) WITH PROPOFOL;  Surgeon: Benancio Deeds, MD;  [facility] END[facility]COPY;  Service: Gastroenterology;  Laterality: N/A;   FOOT SURGERY     LUNG REMOVAL, PARTIAL     upper lobe    Social History  reports that she quit smoking about 32 years ago. Her smoking use included cigarettes. She started smoking about 67 years ago. She has a 26.3 pack-year smoking history. She has never used smokeless tobacco. She reports that she does not drink alcohol and does not use drugs.  Allergies  Allergen Reactions   Meloxicam Other (See Comments)    Caused an injury to the kidneys, per nephrologist   Micardis Hct [Telmisartan-Hctz] Other (See Comments)    Caused an injury to the kidneys, per nephrologist    Family History  Problem Relation Age of Onset   Cancer Mother    Cancer Sister    Colon cancer Neg Hx   Reviewed on admission  Prior to Admission medications   Medication Sig Start Date End Date Taking? Authorizing Provider  acetaminophen (TYLENOL) 325 MG tablet Take 1.5-3 tablets (487.5-975 mg total) by mouth every 6 (six) hours as needed for mild pain (or Fever >/= 101). 07/15/23  Yes Johnson, Clanford L, MD  budesonide-formoterol (SYMBICORT) 80-4.5 MCG/ACT inhaler Inhale 2 puffs into the lungs 2 (two) times daily.   Yes [provider]  estradiol (ESTRACE) 0.1 MG/GM vaginal cream Discard plastic applicator. Insert a blueberry size amount (approximately 1 gram) of cream on fingertip inside vagina at bedtime every night for 1 week then every other night. For long term use. 12/01/23  Yes Deliah Boston, Eleonore Chiquito, FNP  furosemide (LASIX) 20 MG tablet Take 1 tablet (20 mg total) by mouth 2 (two) times daily. 10/22/23  Yes Mallipeddi, Vishnu P, MD  levothyroxine (SYNTHROID) 100 MCG tablet Take 1 tablet (100 mcg total) by mouth daily before breakfast. 08/28/21  Yes Johnson, Clanford L, MD   Magnesium 400 MG CAPS as directed Orally   Yes [provider]  metoprolol succinate (TOPROL-XL) 25 MG 24 hr tablet TAKE 1 TABLET DAILY 02/18/23  Yes Mallipeddi, Vishnu P, MD  mirtazapine (REMERON) 30 MG tablet Take 30 mg by mouth at bedtime.   Yes [provider]  Multiple Vitamin (MULTI-VITAMIN) tablet Take 1 tablet by mouth daily.   Yes [provider]  OLANZapine (ZYPREXA) 5 MG tablet Take 5 mg by mouth daily. 10/14/23  Yes [provider]  pantoprazole (PROTONIX) 40 MG tablet Take 40 mg by mouth daily.   Yes [provider]  pravastatin (PRAVACHOL) 40 MG tablet Take 1 tablet (40 mg total) by mouth every evening. 07/15/23  Yes Johnson, Clanford L, MD  QUEtiapine (SEROQUEL) 25 MG tablet Take 25 mg by mouth at bedtime as needed. 10/19/23  Yes [provider]  traZODone (DESYREL) 100 MG tablet Take 100 mg by mouth at bedtime. 10/14/23  Yes [provider]  Vibegron (GEMTESA) 75 MG TABS Take 1 tablet (75 mg total) by mouth daily. 12/01/23  Yes Donnita Falls, FNP  Fluticasone-Umeclidin-Vilant (TRELEGY ELLIPTA) 100-62.5-25 MCG/ACT AEPB Inhale 1 puff into the lungs daily.  12/18/22   Oretha Milch, MD    Physical Exam: Vitals:   12/03/23 1700 12/03/23 1715 12/03/23 1730 12/03/23 1745  BP: (!) 92/46 (!) 113/59 (!) 101/55 (!) 102/55  Pulse: 98 (!) 103 100 100  Resp: (!) 26 (!) 30 (!) 26 (!) 29  Temp:      TempSrc:      SpO2: 100% 96% 100% 100%  Weight:      Height:        Physical Exam Constitutional:      General: She is not in acute distress.    Appearance: Normal appearance.  HENT:     Head: Normocephalic and atraumatic.     Mouth/Throat:     Mouth: Mucous membranes are moist.     Pharynx: Oropharynx is clear.  Eyes:     Extraocular Movements: Extraocular movements intact.     Pupils: Pupils are equal, round, and reactive to light.  Cardiovascular:     Rate and Rhythm: Normal rate and regular rhythm.     Pulses:  Normal pulses.     Heart sounds: Murmur heard.  Pulmonary:     Effort: Pulmonary effort is normal. No respiratory distress.     Breath sounds: Normal breath sounds.  Abdominal:     General: Bowel sounds are normal. There is no distension.     Palpations: Abdomen is soft.     Tenderness: There is no abdominal tenderness.  Musculoskeletal:        General: No swelling or deformity.  Skin:    General: Skin is warm and dry.  Neurological:     General: No focal deficit present.     Mental Status: Mental status is at baseline.    Labs on Admission: I have personally reviewed following labs and imaging studies  CBC: Recent Labs  Lab 12/03/23 1325 12/03/23 1331 12/03/23 1757  WBC 9.4  --   --   HGB 8.2* 9.5* 8.8*  HCT 28.2* 28.0* 26.0*  MCV 102.9*  --   --   PLT 41*  --   --     Basic Metabolic Panel: Recent Labs  Lab 12/03/23 1325 12/03/23 1331 12/03/23 1757  NA 129* 130* 136  K 6.3* 6.4* 4.9  CL 97*  --   --   CO2 22  --   --   GLUCOSE 96  --   --   BUN 32*  --   --   CREATININE 1.67*  --   --   CALCIUM 9.2  --   --     GFR: Estimated Creatinine Clearance: 22.4 mL/min (A) (by C-G formula based on SCr of 1.67 mg/dL (H)).  Liver Function Tests: Recent Labs  Lab 12/03/23 1325  AST 37  ALT 12  ALKPHOS 42  BILITOT 1.7*  PROT 9.4*  ALBUMIN 3.2*    Urine analysis:    Component Value Date/Time   COLORURINE YELLOW 12/03/2023 1716   APPEARANCEUR HAZY (A) 12/03/2023 1716   APPEARANCEUR Clear 12/01/2023 0954   LABSPEC 1.011 12/03/2023 1716   PHURINE 5.0 12/03/2023 1716   GLUCOSEU NEGATIVE 12/03/2023 1716   HGBUR MODERATE (A) 12/03/2023 1716   BILIRUBINUR NEGATIVE 12/03/2023 1716   BILIRUBINUR Negative 12/01/2023 0954   KETONESUR NEGATIVE 12/03/2023 1716   PROTEINUR 100 (A) 12/03/2023 1716   NITRITE NEGATIVE 12/03/2023 1716   LEUKOCYTESUR LARGE (A) 12/03/2023 1716    Radiological Exams on Admission: CT Head Wo Contrast Result Date: 12/03/2023 CLINICAL  DATA:  Mental status change, unknown  cause. Altered mental status. EXAM: CT HEAD WITHOUT CONTRAST TECHNIQUE: Contiguous axial images were obtained from the base of the skull through the vertex without intravenous contrast. RADIATION DOSE REDUCTION: This exam was performed according to the departmental dose-optimization program which includes automated exposure control, adjustment of the mA and/or kV according to patient size and/or use of iterative reconstruction technique. COMPARISON:  Head CT 05/20/2023 and MRI 02/10/2022 FINDINGS: Brain: There is no evidence of an acute infarct, intracranial hemorrhage, mass, midline shift, or extra-axial fluid collection. Generalized cerebral atrophy is mild for age. Confluent hypodensities in the cerebral white matter are stable to mildly progressive and nonspecific but compatible with severe chronic small vessel ischemic disease. Vascular: Calcified atherosclerosis at the skull base. No hyperdense vessel. Skull: No acute fracture or suspicious osseous lesion. Sinuses/Orbits: Mild mucosal thickening and small mucous retention cyst in the left maxillary sinus. No significant mastoid fluid. Bilateral cataract extraction. Other: None. IMPRESSION: 1. No evidence of acute intracranial abnormality. 2. Severe chronic small vessel ischemic disease. Electronically Signed   By: Sebastian Ache M.D.   On: 12/03/2023 14:08   DG Chest Port 1 View Result Date: 12/03/2023 CLINICAL DATA:  Altered mental status. EXAM: PORTABLE CHEST 1 VIEW COMPARISON:  X-ray 08/11/2023. FINDINGS: Enlarged cardiopericardial silhouette. Calcified tortuous aorta. Interstitial changes are again seen but improved from previous. Surgical changes medial left lung apex. Tiny pleural effusions. Overlapping cardiac leads. Degenerative changes of the spine. IMPRESSION: Enlarged heart. Small prominent central vasculature with some mild interstitial changes but less than prior. Question tiny effusions. Postsurgical changes  left lung Electronically Signed   By: Karen Kays M.D.   On: 12/03/2023 12:22    EKG: Independently reviewed.  Sinus rhythm at 91 bpm.  ST abnormality and V3-6 consistent with repolarization similar to previous.  Mild ST depression in inferior leads with out T wave version.  Also similar to previous likely represents repolarization as well.  No significantly peaked T waves compared to previous.  Assessment/Plan Active Problems:   Myelodysplastic syndrome (HCC)   Invasive ductal carcinoma of left breast (HCC)   Adenocarcinoma of left lung (HCC)   Chronic respiratory failure with hypoxia (HCC)   CKD stage 3a, GFR 45-59 ml/min (HCC)   Gout   Hypothyroidism   HTN (hypertension)   Paroxysmal atrial fibrillation (HCC)   Chronic diastolic CHF (congestive heart failure) (HCC) - LVEF 60-65% by echo 07-2023   Nonrheumatic aortic valve stenosis   CAD (coronary artery disease)   CMML (chronic myelomonocytic leukemia) (HCC)   Deficiency anemia   COPD (chronic obstructive pulmonary disease) (HCC)   Acute encephalopathy > Worsening for the past day now same things I do not make sense and alert and oriented to self and place only. > Daughter reports patient had been off of home oxygen for 1 day.  Also evidence of possible recurrent UTI as below.  Electrolyte disturbance though not significant enough to account for altered mentation. > Patient started back on  chronic supplemental oxygen.  Will treat for possible UTI. - Monitor on telemetry overnight - Continue with home oxygen - Treat UTI as below - Continue to monitor  Hyperkalemia AKI on CKD 3A > Creatinine noted to be elevated to 1.67 from baseline of 1.  Potassium 6.3. > Received temporizing measures in the ED for hyperkalemia including insulin, dextrose, calcium gluconate, albuterol, Lokelma. > EDP discussed case with nephrology on-call who recommended continuing with temporizing measures and additional doses of Lokelma and to reconsult if  needed. > Repeat  i-STAT showed improvement in potassium, will confirm with formal labs. > With decreased p.o. intake and no clinical volume overload will start gentle IV fluids given patient's history of CHF and possible vascular congestion on chest x-ray. - Monitoring on telemetry as above - Continue with Lokelma - Repeat CMP - Trend renal function and electrolytes - IV fluids at 75 cc an hour  UTI > History of recent UTI, completed course of Ceftin. > Urinalysis at urology follow-up visit showed 1+ leukocytes, no WBCs no bacteria. > Analysis here with hemoglobin, protein, leukocytes, bacteria.  Blood cultures pending. > Patient remains afebrile.  White blood cell count is 9.4 which is elevated from baseline leukopenia 4. - Start ceftriaxone daily - Urine cultures - Follow-up blood cultures - Trend fever curve and WBC  Hypertension - Continue home metoprolol - Holding Lasix in the setting of AKI  Paroxysmal atrial fibrillation - Continue home metoprolol - Not on anticoagulation  Chronic diastolic CHF Aortic stenosis > Last echo in September 2024 with EF 60-65%, G2 DD, normal RV function, moderate to severe aortic stenosis. > Euvolemic to volume down on exam. - Holding Lasix in the setting of AKI - Continue home metoprolol  Chronic respiratory failure with hypoxia > Had not been using her home oxygen for the past week as above.  - Continue supplemental oxygen  CAD - Continue metoprolol, pravastatin  Pancytopenia CMML > Follows with hematology.  Has been receiving Aranesp. > Hemoglobin stable at 8.2, platelets lower than baseline of 41.  WBC higher than baseline at 9.4 as above. - Noted - Will hold off on chemoprophylaxis for DVT  History of breast cancer > Status post lumpectomy and 5 years of tamoxifen - Follows with oncology  History of lung cancer > Status post left upper lobectomy - Follows with oncology  Hypothyroidism COPD - Not on medications for these  problems  Goals of Care > Grandson, HCPOA and MD, requesting palliative consult. - Palliative medicine consult  DVT prophylaxis: SCDs Code Status:   Full Family Communication:  Daughter and Lucila Maine (MD, HCPOA) updated by phone Disposition Plan:   Patient is from:  Home  Anticipated DC to:  Home  Anticipated DC date:  1 to 4 days  Anticipated DC barriers: None  Consults called:  None Admission status:  Observation, telemetry  Severity of Illness: The appropriate patient status for this patient is OBSERVATION. Observation status is judged to be reasonable and necessary in order to provide the required intensity of service to ensure the patient's safety. The patient's presenting symptoms, physical exam findings, and initial radiographic and laboratory data in the context of their medical condition is felt to place them at decreased risk for further clinical deterioration. Furthermore, it is anticipated that the patient will be medically stable for discharge from the hospital within 2 midnights of admission.    Synetta Fail MD [facility]  How to contact the [facility] Attending or Consulting provider 7A - 7P or covering provider during after hours 7P -7A, for this patient?   Check the care team in [facility] and look for a) attending/consulting [facility] provider listed and b) the [facility] team listed Log into [email or web address] and use [facility]'s universal password to access. If you do not have the password, please contact the hospital operator. Locate the [facility] provider you are looking for under [facility] and page to a number that you can be directly reached. If you still have difficulty reaching the provider, please page the [facility] (Director on  Call) for the Hospitalists listed on amion for assistance.  12/03/2023, 6:12 PM

## 2023-12-03 NOTE — ED Triage Notes (Signed)
Pt BIB daughter for not eating for several days. Hasn't been wearing oxygen for a week and early this morning was acting confused and then stood to go to bathroom and almost fell. Daughter states she was with her and kept her from falling.

## 2023-12-03 NOTE — ED Provider Notes (Signed)
  Physical Exam  BP (!) 106/54   Pulse 88   Temp 98.2 F (36.8 C) (Oral)   Resp (!) 21   SpO2 100%   Physical Exam Vitals and nursing note reviewed.  Constitutional:      General: She is not in acute distress. Eyes:     Extraocular Movements: Extraocular movements intact.     Conjunctiva/sclera: Conjunctivae normal.  Cardiovascular:     Rate and Rhythm: Tachycardia present.  Pulmonary:     Effort: No respiratory distress.  Skin:    General: Skin is warm and dry.  Neurological:     Mental Status: She is alert.     Comments: Alert and oriented x2 to person and place  Unable to perform neuroexam due to being altered.     Procedures  Procedures  ED Course / MDM   Clinical Course as of 12/03/23 1520  Fri Dec 03, 2023  1458 Plan: Admission. 1 week of not used O2, baseline of 3L. Altered mental status. Hx of CVA, TA, NSTEMI, AAA, CAD, lung cancer. Seen 7 days ago, Dx w/ UTI, finished abx. Poor neuro exam. Hyperkalemic, needs ECG. Has been given  Needs nephrology consult (no medications causing bump in K or Cr. Any recommendations or changes to current Tx plan).  [CB]  1519 ECG Qts 410 [MT]    Clinical Course User Index [CB] Lunette Stands, PA-C [MT] Renaye Rakers, Kermit Balo, MD   Medical Decision Making Amount and/or Complexity of Data Reviewed Labs: ordered. Radiology: ordered.  Risk OTC drugs. Prescription drug management.   Patient care was handed off from Wilfred Lacy, [place].  Patient is an 89 year old female presents to the ED today with a 1 week history of altered mental status.  Previous medical history of A-fib, not anticoagulated, NSTEMI, adenocarcinoma left lung, pulmonary hypertension, hypothyroidism, hypertension, CHF, infrarenal AAA 4.8cm, 2024.  Last echo was 07/12/2023 showing EF of 60 to 65% with severe aortic stenosis.  Seen on 11/22/2023 for acute cystitis, treated with cefuroxime.  Patient was noted to have abnormal neuroexam, being unable to follow  commands, with notable hyperkalemia of 6.3 with worsening creatinine of 1.67 and increased BUN of 32. Currently taking metoprolol but has been on it for some time.  CT head showed no acute abnormalities no but present with severe chronic small vessel ischemic disease. ECG shows ST depression in lateral and inferior leads but not signs of peaked T waves.   Patient has currently received 5 units of NovoLog, albuterol and calcium gluconate, Lokelma.  Plan at time of handoff is to get nephrology consult, admit patient.  Spoke with nephrology who said to continue current treatment plan and to be sure to give more [facility] treatment.  Will look to admit the patient.  Hospitals within consulted, patient was admitted, and care was then transferred over at that time.    Lunette Stands, [place] 12/03/23 1816    Benjiman Core, MD 12/03/23 [phone]

## 2023-12-03 NOTE — ED Notes (Signed)
Pt not able to urinate after stating she felt like she had to. Informed RN.

## 2023-12-03 NOTE — ED Provider Notes (Signed)
[place] EMERGENCY DEPARTMENT AT [facility] Provider Note   CSN: [id number] Arrival date & time: 12/03/23  1055     History  Chief Complaint  Patient presents with   Altered Mental Status    BRIHANNA DEVENPORT is a 89 y.o. female history of A-fib not currently anticoagulated, NSTEMI, adenocarcinoma left lung, pulmonary hypertension, hypothyroidism, hypertension, CHF, AAA presented with altered mental status.  Patient was unable to provide history due to altered mental status.  And so daughter provided history.  Daughter states over the past week patient's not been using her oxygen at home but notes that last night patient became increasingly altered and saying things that did not make sense.  Patient was weak today and started to fall her daughter caught her.  Patient was recently seen for UTI and daughter states that patient is been taking antibiotics without issue and denies any fevers or vomiting but does state patient has had decreased appetite.  Level 5 caveat: Altered mental status  Home Medications Prior to Admission medications   Medication Sig Start Date End Date Taking? Authorizing Provider  acetaminophen (TYLENOL) 325 MG tablet Take 1.5-3 tablets (487.5-975 mg total) by mouth every 6 (six) hours as needed for mild pain (or Fever >/= 101). 07/15/23  Yes Johnson, Clanford L, MD  budesonide-formoterol (SYMBICORT) 80-4.5 MCG/ACT inhaler Inhale 2 puffs into the lungs 2 (two) times daily.   Yes [provider]  estradiol (ESTRACE) 0.1 MG/GM vaginal cream Discard plastic applicator. Insert a blueberry size amount (approximately 1 gram) of cream on fingertip inside vagina at bedtime every night for 1 week then every other night. For long term use. 12/01/23  Yes Deliah Boston, Eleonore Chiquito, FNP  furosemide (LASIX) 20 MG tablet Take 1 tablet (20 mg total) by mouth 2 (two) times daily. 10/22/23  Yes Mallipeddi, Vishnu P, MD  levothyroxine (SYNTHROID) 100 MCG tablet Take 1 tablet (100  mcg total) by mouth daily before breakfast. 08/28/21  Yes Johnson, Clanford L, MD  Magnesium 400 MG CAPS as directed Orally   Yes [provider]  metoprolol succinate (TOPROL-XL) 25 MG 24 hr tablet TAKE 1 TABLET DAILY 02/18/23  Yes Mallipeddi, Vishnu P, MD  mirtazapine (REMERON) 30 MG tablet Take 30 mg by mouth at bedtime.   Yes [provider]  Multiple Vitamin (MULTI-VITAMIN) tablet Take 1 tablet by mouth daily.   Yes [provider]  OLANZapine (ZYPREXA) 5 MG tablet Take 5 mg by mouth daily. 10/14/23  Yes [provider]  pantoprazole (PROTONIX) 40 MG tablet Take 40 mg by mouth daily.   Yes [provider]  pravastatin (PRAVACHOL) 40 MG tablet Take 1 tablet (40 mg total) by mouth every evening. 07/15/23  Yes Johnson, Clanford L, MD  QUEtiapine (SEROQUEL) 25 MG tablet Take 25 mg by mouth at bedtime as needed. 10/19/23  Yes [provider]  traZODone (DESYREL) 100 MG tablet Take 100 mg by mouth at bedtime. 10/14/23  Yes [provider]  Vibegron (GEMTESA) 75 MG TABS Take 1 tablet (75 mg total) by mouth daily. 12/01/23  Yes Donnita Falls, FNP  Fluticasone-Umeclidin-Vilant (TRELEGY ELLIPTA) 100-62.5-25 MCG/ACT AEPB Inhale 1 puff into the lungs daily. 12/18/22   Oretha Milch, MD      Allergies    Meloxicam and Micardis hct [telmisartan-hctz]    Review of Systems   Review of Systems  Physical Exam Updated Vital Signs BP (!) 118/46   Pulse 89   Temp 98.2 F (36.8 C) (Oral)  Resp (!) 29   SpO2 100%  Physical Exam Constitutional:      General: She is not in acute distress.    Comments: Altered  Cardiovascular:     Rate and Rhythm: Normal rate and regular rhythm.     Pulses: Normal pulses.     Heart sounds: Normal heart sounds.  Pulmonary:     Effort: Pulmonary effort is normal. No respiratory distress.     Breath sounds: Normal breath sounds.  Abdominal:     Palpations: Abdomen is soft.     Tenderness: There is no  abdominal tenderness. There is no guarding or rebound.  Skin:    General: Skin is warm.     Capillary Refill: Capillary refill takes less than 2 seconds.  Neurological:     Comments: Unable to assess as patient cannot follow commands     ED Results / Procedures / Treatments   Labs (all labs ordered are listed, but only abnormal results are displayed) Labs Reviewed  CBC - Abnormal; Notable for the following components:      Result Value   RBC 2.74 (*)    Hemoglobin 8.2 (*)    HCT 28.2 (*)    MCV 102.9 (*)    MCHC 29.1 (*)    RDW 21.5 (*)    Platelets 41 (*)    nRBC 16.1 (*)    All other components within normal limits  I-STAT VENOUS BLOOD GAS, ED - Abnormal; Notable for the following components:   pCO2, Ven 36.5 (*)    Sodium 130 (*)    Potassium 6.4 (*)    HCT 28.0 (*)    Hemoglobin 9.5 (*)    All other components within normal limits  CULTURE, BLOOD (ROUTINE X 2)  CULTURE, BLOOD (ROUTINE X 2)  COMPREHENSIVE METABOLIC PANEL  URINALYSIS, ROUTINE W REFLEX MICROSCOPIC  CBG MONITORING, ED  I-STAT CG4 LACTIC ACID, ED    EKG None  Radiology CT Head Wo Contrast Result Date: 12/03/2023 CLINICAL DATA:  Mental status change, unknown cause. Altered mental status. EXAM: CT HEAD WITHOUT CONTRAST TECHNIQUE: Contiguous axial images were obtained from the base of the skull through the vertex without intravenous contrast. RADIATION DOSE REDUCTION: This exam was performed according to the departmental dose-optimization program which includes automated exposure control, adjustment of the mA and/or kV according to patient size and/or use of iterative reconstruction technique. COMPARISON:  Head CT 05/20/2023 and MRI 02/10/2022 FINDINGS: Brain: There is no evidence of an acute infarct, intracranial hemorrhage, mass, midline shift, or extra-axial fluid collection. Generalized cerebral atrophy is mild for age. Confluent hypodensities in the cerebral white matter are stable to mildly progressive  and nonspecific but compatible with severe chronic small vessel ischemic disease. Vascular: Calcified atherosclerosis at the skull base. No hyperdense vessel. Skull: No acute fracture or suspicious osseous lesion. Sinuses/Orbits: Mild mucosal thickening and small mucous retention cyst in the left maxillary sinus. No significant mastoid fluid. Bilateral cataract extraction. Other: None. IMPRESSION: 1. No evidence of acute intracranial abnormality. 2. Severe chronic small vessel ischemic disease. Electronically Signed   By: Sebastian Ache M.D.   On: 12/03/2023 14:08   DG Chest Port 1 View Result Date: 12/03/2023 CLINICAL DATA:  Altered mental status. EXAM: PORTABLE CHEST 1 VIEW COMPARISON:  X-ray 08/11/2023. FINDINGS: Enlarged cardiopericardial silhouette. Calcified tortuous aorta. Interstitial changes are again seen but improved from previous. Surgical changes medial left lung apex. Tiny pleural effusions. Overlapping cardiac leads. Degenerative changes of the spine. IMPRESSION: Enlarged heart. Small prominent  central vasculature with some mild interstitial changes but less than prior. Question tiny effusions. Postsurgical changes left lung Electronically Signed   By: Karen Kays M.D.   On: 12/03/2023 12:22    Procedures .Critical Care  Performed by: Netta Corrigan, PA-C Authorized by: Netta Corrigan, PA-C   Critical care provider statement:    Critical care time (minutes):  40   Critical care time was exclusive of:  Separately billable procedures and treating other patients   Critical care was necessary to treat or prevent imminent or life-threatening deterioration of the following conditions:  Dehydration (Hyperkalemia)   Critical care was time spent personally by me on the following activities:  Development of treatment plan with patient or surrogate, blood draw for specimens, evaluation of patient's response to treatment, examination of patient, obtaining history from patient or surrogate,  re-evaluation of patient's condition, pulse oximetry, review of old charts, ordering and review of radiographic studies, ordering and review of laboratory studies and ordering and performing treatments and interventions   I assumed direction of critical care for this patient from another provider in my specialty: no     Care discussed with comment:  Oncoming provider     Medications Ordered in ED Medications - No data to display  ED Course/ Medical Decision Making/ A&P                                 Medical Decision Making Amount and/or Complexity of Data Reviewed Labs: ordered. Radiology: ordered.   Antonietta Breach 89 y.o. presented today for AMS. Working DDx that I considered at this time includes, but not limited to, CVA, ICH, intracranial mass, critical dehydration, heptatic dysfunction, uremia, hypercarbia/hypoxia/COPD exacerbation, intoxication, endocrine abnormality, toxidrome, adrenal insufficiency, hypoglycemia/hyperglycemia, paraneoplastic process, CNS infection, meningitis.  R/o DDx: Pending  Review of prior external notes: 08/16/2023 discharge summary  Unique Tests and My Independent Interpretation:  CT Head wo Contrast: No acute findings CBC: Unremarkable, similar to baseline Lactic acid: Unremarkable CMP: Hyperkalemia 6.3, creatinine 1.67 UA: Pending CXR: Enlarged heart with tiny effusions CBG: 91 Blood cultures: Pending VBG: Hyperkalemia 6.4 EKG: Rate, rhythm, axis, intervals all examined and without medically relevant abnormality. ST segments without concerns for elevations  Social Determinants of Health: none  Discussion with Independent Historian:  Daughter  Discussion of Management of Tests: None  Risk: High: hospitalization or escalation of hospital-level care  Risk Stratification Score: none  Staffed with Trifan, MD  Plan: On exam patient was altered and cannot give history. The cardiac monitor was ordered secondary to the patient's history  of AMS and to monitor the patient for dysrhythmia. Cardiac monitor by my independent interpretation showed sinus rhythm.  Patient was altered in the room cannot give history and so daughter had to provide history.  Charge states that patient's tachycardic 127 however when I went to evaluate the patient this is after she received oxygen was placed on 3 L nasal cannula patient's heart rate was 98 in the room.  Patient potassium did come back elevated at 6.3.  Patient not on any potassium sparing medications so unsure as to why she is hyper kalemia.  Still waiting on EKG and nursing staff was notified.  Patient signed out to [place], [place].  Please review their note for the continuation of patient's care.  The plan at this point is follow-up on labs and imaging anticipate admission due to patient's altered state with hyperkalemia.  This chart was dictated using voice recognition software.  Despite best efforts to proofread,  errors can occur which can change the documentation meaning.         Final Clinical Impression(s) / ED Diagnoses Final diagnoses:  None    Rx / DC Orders ED Discharge Orders     None         Remi Deter 12/03/23 1504    Terald Sleeper, MD 12/03/23 [phone]

## 2023-12-04 DIAGNOSIS — D539 Nutritional anemia, unspecified: Secondary | ICD-10-CM | POA: Diagnosis present

## 2023-12-04 DIAGNOSIS — E875 Hyperkalemia: Secondary | ICD-10-CM | POA: Diagnosis present

## 2023-12-04 DIAGNOSIS — I469 Cardiac arrest, cause unspecified: Secondary | ICD-10-CM | POA: Diagnosis not present

## 2023-12-04 DIAGNOSIS — E8721 Acute metabolic acidosis: Secondary | ICD-10-CM | POA: Diagnosis not present

## 2023-12-04 DIAGNOSIS — G9340 Encephalopathy, unspecified: Secondary | ICD-10-CM | POA: Diagnosis not present

## 2023-12-04 DIAGNOSIS — Z7189 Other specified counseling: Secondary | ICD-10-CM | POA: Diagnosis not present

## 2023-12-04 DIAGNOSIS — 419620001 Death: Secondary | SNOMED CT | POA: Diagnosis not present

## 2023-12-04 DIAGNOSIS — R918 Other nonspecific abnormal finding of lung field: Secondary | ICD-10-CM | POA: Diagnosis not present

## 2023-12-04 DIAGNOSIS — E871 Hypo-osmolality and hyponatremia: Secondary | ICD-10-CM | POA: Diagnosis present

## 2023-12-04 DIAGNOSIS — Z66 Do not resuscitate: Secondary | ICD-10-CM | POA: Diagnosis not present

## 2023-12-04 DIAGNOSIS — D469 Myelodysplastic syndrome, unspecified: Secondary | ICD-10-CM | POA: Diagnosis not present

## 2023-12-04 DIAGNOSIS — I5032 Chronic diastolic (congestive) heart failure: Secondary | ICD-10-CM | POA: Diagnosis present

## 2023-12-04 DIAGNOSIS — J9622 Acute and chronic respiratory failure with hypercapnia: Secondary | ICD-10-CM | POA: Diagnosis not present

## 2023-12-04 DIAGNOSIS — N179 Acute kidney failure, unspecified: Secondary | ICD-10-CM | POA: Diagnosis present

## 2023-12-04 DIAGNOSIS — I272 Pulmonary hypertension, unspecified: Secondary | ICD-10-CM | POA: Diagnosis present

## 2023-12-04 DIAGNOSIS — F0390 Unspecified dementia without behavioral disturbance: Secondary | ICD-10-CM | POA: Diagnosis present

## 2023-12-04 DIAGNOSIS — J449 Chronic obstructive pulmonary disease, unspecified: Secondary | ICD-10-CM | POA: Diagnosis not present

## 2023-12-04 DIAGNOSIS — A419 Sepsis, unspecified organism: Secondary | ICD-10-CM | POA: Diagnosis present

## 2023-12-04 DIAGNOSIS — I480 Paroxysmal atrial fibrillation: Secondary | ICD-10-CM | POA: Diagnosis present

## 2023-12-04 DIAGNOSIS — E872 Acidosis, unspecified: Secondary | ICD-10-CM | POA: Diagnosis present

## 2023-12-04 DIAGNOSIS — J9621 Acute and chronic respiratory failure with hypoxia: Secondary | ICD-10-CM | POA: Diagnosis not present

## 2023-12-04 DIAGNOSIS — N390 Urinary tract infection, site not specified: Secondary | ICD-10-CM | POA: Diagnosis present

## 2023-12-04 DIAGNOSIS — I130 Hypertensive heart and chronic kidney disease with heart failure and stage 1 through stage 4 chronic kidney disease, or unspecified chronic kidney disease: Secondary | ICD-10-CM | POA: Diagnosis present

## 2023-12-04 DIAGNOSIS — I10 Essential (primary) hypertension: Secondary | ICD-10-CM | POA: Diagnosis not present

## 2023-12-04 DIAGNOSIS — C9310 Chronic myelomonocytic leukemia not having achieved remission: Secondary | ICD-10-CM | POA: Diagnosis present

## 2023-12-04 DIAGNOSIS — C50912 Malignant neoplasm of unspecified site of left female breast: Secondary | ICD-10-CM | POA: Diagnosis not present

## 2023-12-04 DIAGNOSIS — I2510 Atherosclerotic heart disease of native coronary artery without angina pectoris: Secondary | ICD-10-CM | POA: Diagnosis not present

## 2023-12-04 DIAGNOSIS — G9341 Metabolic encephalopathy: Secondary | ICD-10-CM | POA: Diagnosis present

## 2023-12-04 DIAGNOSIS — C3492 Malignant neoplasm of unspecified part of left bronchus or lung: Secondary | ICD-10-CM | POA: Diagnosis not present

## 2023-12-04 DIAGNOSIS — E039 Hypothyroidism, unspecified: Secondary | ICD-10-CM | POA: Diagnosis present

## 2023-12-04 DIAGNOSIS — J150 Pneumonia due to Klebsiella pneumoniae: Secondary | ICD-10-CM | POA: Diagnosis present

## 2023-12-04 DIAGNOSIS — J9611 Chronic respiratory failure with hypoxia: Secondary | ICD-10-CM | POA: Diagnosis not present

## 2023-12-04 DIAGNOSIS — R627 Adult failure to thrive: Secondary | ICD-10-CM | POA: Diagnosis not present

## 2023-12-04 DIAGNOSIS — I080 Rheumatic disorders of both mitral and aortic valves: Secondary | ICD-10-CM | POA: Diagnosis present

## 2023-12-04 DIAGNOSIS — D61818 Other pancytopenia: Secondary | ICD-10-CM | POA: Diagnosis present

## 2023-12-04 DIAGNOSIS — N1831 Chronic kidney disease, stage 3a: Secondary | ICD-10-CM | POA: Diagnosis present

## 2023-12-04 DIAGNOSIS — Z515 Encounter for palliative care: Secondary | ICD-10-CM | POA: Diagnosis not present

## 2023-12-04 DIAGNOSIS — I350 Nonrheumatic aortic (valve) stenosis: Secondary | ICD-10-CM | POA: Diagnosis not present

## 2023-12-04 DIAGNOSIS — J9610 Chronic respiratory failure, unspecified whether with hypoxia or hypercapnia: Secondary | ICD-10-CM | POA: Diagnosis not present

## 2023-12-04 DIAGNOSIS — R6521 Severe sepsis with septic shock: Secondary | ICD-10-CM | POA: Diagnosis present

## 2023-12-04 DIAGNOSIS — R531 Weakness: Secondary | ICD-10-CM | POA: Diagnosis not present

## 2023-12-04 DIAGNOSIS — D649 Anemia, unspecified: Secondary | ICD-10-CM | POA: Diagnosis not present

## 2023-12-04 LAB — VITAMIN B12: Vitamin B-12: 761 pg/mL (ref 180–914)

## 2023-12-04 LAB — COMPREHENSIVE METABOLIC PANEL
ALT: 10 U/L (ref 0–44)
AST: 32 U/L (ref 15–41)
Albumin: 2.7 g/dL — ABNORMAL LOW (ref 3.5–5.0)
Alkaline Phosphatase: 34 U/L — ABNORMAL LOW (ref 38–126)
Anion gap: 9 (ref 5–15)
BUN: 27 mg/dL — ABNORMAL HIGH (ref 8–23)
CO2: 24 mmol/L (ref 22–32)
Calcium: 9.1 mg/dL (ref 8.9–10.3)
Chloride: 96 mmol/L — ABNORMAL LOW (ref 98–111)
Creatinine, Ser: 1.38 mg/dL — ABNORMAL HIGH (ref 0.44–1.00)
GFR, Estimated: 37 mL/min — ABNORMAL LOW (ref >60)
Glucose, Bld: 90 mg/dL (ref 70–99)
Potassium: 5.5 mmol/L — ABNORMAL HIGH (ref 3.5–5.1)
Sodium: 129 mmol/L — ABNORMAL LOW (ref 135–145)
Total Bilirubin: 1.3 mg/dL — ABNORMAL HIGH (ref 0.0–1.2)
Total Protein: 8 g/dL (ref 6.5–8.1)

## 2023-12-04 LAB — CBC
HCT: 24.6 % — ABNORMAL LOW (ref 36.0–46.0)
Hemoglobin: 7.3 g/dL — ABNORMAL LOW (ref 12.0–15.0)
MCH: 29.4 pg (ref 26.0–34.0)
MCHC: 29.7 g/dL — ABNORMAL LOW (ref 30.0–36.0)
MCV: 99.2 fL (ref 80.0–100.0)
Platelets: 39 10*3/uL — ABNORMAL LOW (ref 150–400)
RBC: 2.48 MIL/uL — ABNORMAL LOW (ref 3.87–5.11)
RDW: 21 % — ABNORMAL HIGH (ref 11.5–15.5)
WBC: 12.2 10*3/uL — ABNORMAL HIGH (ref 4.0–10.5)
nRBC: 9.2 % — ABNORMAL HIGH (ref 0.0–0.2)

## 2023-12-04 LAB — MAGNESIUM: Magnesium: 1.6 mg/dL — ABNORMAL LOW (ref 1.7–2.4)

## 2023-12-04 LAB — POTASSIUM: Potassium: 4.6 mmol/L (ref 3.5–5.1)

## 2023-12-04 LAB — IRON AND TIBC
Iron: 25 ug/dL — ABNORMAL LOW (ref 28–170)
Saturation Ratios: 11 % (ref 10.4–31.8)
TIBC: 218 ug/dL — ABNORMAL LOW (ref 250–450)
UIBC: 193 ug/dL

## 2023-12-04 LAB — FOLATE: Folate: 36.3 ng/mL (ref >5.9)

## 2023-12-04 LAB — FERRITIN: Ferritin: 863 ng/mL — ABNORMAL HIGH (ref 11–307)

## 2023-12-04 MED ORDER — INSULIN ASPART 100 UNIT/ML IV SOLN
5.0000 [IU] | Freq: Once | INTRAVENOUS | Status: AC
  Administered 2023-12-04: 5 [IU] via INTRAVENOUS

## 2023-12-04 MED ORDER — ALBUTEROL SULFATE (2.5 MG/3ML) 0.083% IN NEBU
10.0000 mg | INHALATION_SOLUTION | Freq: Once | RESPIRATORY_TRACT | Status: AC
  Administered 2023-12-04: 10 mg via RESPIRATORY_TRACT
  Filled 2023-12-04: qty 12

## 2023-12-04 MED ORDER — SODIUM ZIRCONIUM CYCLOSILICATE 10 G PO PACK
10.0000 g | PACK | Freq: Two times a day (BID) | ORAL | Status: DC
  Administered 2023-12-04: 10 g via ORAL
  Filled 2023-12-04: qty 1

## 2023-12-04 MED ORDER — MOMETASONE FURO-FORMOTEROL FUM 100-5 MCG/ACT IN AERO: 2.0000 | INHALATION_SPRAY | Freq: Two times a day (BID) | RESPIRATORY_TRACT | Status: DC

## 2023-12-04 MED ORDER — FLUTICASONE FUROATE-VILANTEROL 100-25 MCG/ACT IN AEPB
1.0000 | INHALATION_SPRAY | Freq: Every day | RESPIRATORY_TRACT | Status: DC
  Filled 2023-12-04: qty 28

## 2023-12-04 MED ORDER — PANTOPRAZOLE SODIUM 40 MG PO TBEC
40.0000 mg | DELAYED_RELEASE_TABLET | Freq: Every day | ORAL | Status: DC
Start: 2023-12-05 — End: 2023-12-04

## 2023-12-04 MED ORDER — ACETAMINOPHEN 325 MG PO TABS
650.0000 mg | ORAL_TABLET | Freq: Four times a day (QID) | ORAL | Status: DC | PRN
  Administered 2023-12-04 – 2023-12-09 (×4): 650 mg via ORAL
  Filled 2023-12-04 (×4): qty 2

## 2023-12-04 MED ORDER — SODIUM CHLORIDE 0.9 % IV SOLN
2.0000 g | INTRAVENOUS | Status: DC
  Administered 2023-12-04 – 2023-12-07 (×4): 2 g via INTRAVENOUS
  Filled 2023-12-04 (×4): qty 20

## 2023-12-04 MED ORDER — DEXTROSE 50 % IV SOLN
1.0000 | Freq: Once | INTRAVENOUS | Status: AC
  Administered 2023-12-04: 50 mL via INTRAVENOUS
  Filled 2023-12-04: qty 50

## 2023-12-04 MED ORDER — MAGNESIUM SULFATE 2 GM/50ML IV SOLN
2.0000 g | Freq: Once | INTRAVENOUS | Status: AC
  Administered 2023-12-04: 2 g via INTRAVENOUS
  Filled 2023-12-04: qty 50

## 2023-12-04 MED ORDER — SODIUM CHLORIDE 0.9 % IV SOLN: INTRAVENOUS | Status: DC

## 2023-12-04 MED ORDER — ESTRADIOL 0.1 MG/GM VA CREA
1.0000 | TOPICAL_CREAM | Freq: Every day | VAGINAL | Status: DC
  Administered 2023-12-04 – 2023-12-09 (×4): 1 via VAGINAL
  Filled 2023-12-04 (×2): qty 42.5

## 2023-12-04 MED ORDER — LEVOTHYROXINE SODIUM 100 MCG PO TABS: 100.0000 ug | ORAL_TABLET | Freq: Every day | ORAL | Status: DC

## 2023-12-04 MED ORDER — PANTOPRAZOLE SODIUM 40 MG PO TBEC
40.0000 mg | DELAYED_RELEASE_TABLET | Freq: Every day | ORAL | Status: DC
  Administered 2023-12-04 – 2023-12-10 (×6): 40 mg via ORAL
  Filled 2023-12-04 (×6): qty 1

## 2023-12-04 MED ORDER — UMECLIDINIUM BROMIDE 62.5 MCG/ACT IN AEPB
1.0000 | INHALATION_SPRAY | Freq: Every day | RESPIRATORY_TRACT | Status: DC
  Administered 2023-12-07 – 2023-12-10 (×4): 1 via RESPIRATORY_TRACT
  Filled 2023-12-04: qty 7

## 2023-12-04 NOTE — Progress Notes (Signed)
PROGRESS NOTE    Lisa [name]  ZOX:[id number] DOB: Nov 12, 1933 DOA: 12/03/2023 PCP: [name], MD    Chief Complaint  Patient presents with   Altered Mental Status    Brief Narrative:  Patient 89 year old female history of hypertension, hypothyroidism, paroxysmal A-fib, CKD 3, hyperkalemia, anemia, gout, diastolic CHF, CAD, breast cancer, lung cancer, MDS, CML, pancytopenia, aortic stenosis, COPD, chronic respiratory failure with hypoxia, AAA presenting with altered mental status.  Patient noted to have not worn her oxygen at home for approximately a week per admitting physician.  Patient noted to have recently been treated for UTI.  Workup in the ED noticeable for hyperkalemia, AKI, urinalysis concerning for UTI.  Head CT with no acute abnormalities.  Patient placed on Lokelma as well as temporizing measures for hyperkalemia.  Urine cultures obtained.  Patient placed empirically on IV antibiotics.    Assessment & Plan:   Principal Problem:   Acute encephalopathy Active Problems:   Myelodysplastic syndrome (HCC)   Invasive ductal carcinoma of left breast (HCC)   Adenocarcinoma of left lung (HCC)   Chronic respiratory failure with hypoxia (HCC)   CKD stage 3a, GFR 45-59 ml/min (HCC)   Gout   Hypothyroidism   HTN (hypertension)   Paroxysmal atrial fibrillation (HCC)   Chronic diastolic CHF (congestive heart failure) (HCC) - LVEF 60-65% by echo 07-2023   Nonrheumatic aortic valve stenosis   CAD (coronary artery disease)   CMML (chronic myelomonocytic leukemia) (HCC)   Deficiency anemia   COPD (chronic obstructive pulmonary disease) (HCC)  1 acute metabolic encephalopathy -Like multifactorial secondary to probable UTI in the setting of possibly being off patient's home O2. -Urinalysis done concerning for UTI. -Urine cultures pending. -CT head negative for any acute abnormalities. -VBG obtained on presentation with a pH of 7.420, pCO2 of 36, pO2 of 40. -Improving  clinically. -Continue empiric IV Rocephin. -Supportive care.  2.  Hyperkalemia/AKI on CKD stage IIIa -Patient noted to have a baseline creatinine of approximately 1 and noted to have a creatinine of 1.67 on admission with a potassium of 6.3 on presentation. -Patient noted to have undergone some temporary measures in the ED for hyperkalemia including insulin dextrose calcium gluconate albuterol and Lokelma. -It is noted the EDP discussed case with nephrology on-call who recommended continuation of temporizing measures with additional doses of Lokelma and reconsult if needed. -Potassium currently at 5.5 this morning. -Renal function improved with hydration with creatinine down to 1.38 this morning. -Repeat EKG. -Increase Lokelma to twice daily. -Albuterol, insulin, dextrose and repeat labs this afternoon. -Change IV fluids from LR to normal saline. -If persistent hyperkalemia may need to consider changing diet to a renal diet.  3.  Hypomagnesemia -Magnesium 1.6. -Magnesium sulfate 2 g IV x 1. -Repeat labs in the AM.  4.  Hyponatremia -Likely secondary to hypovolemic hyponatremia. -Improving with hydration. -IV fluids.  5.  UTI -Urine cultures pending. -Increase IV Rocephin to 2 g daily.  6.  Hypertension -Continue to hold diuretics of Lasix in the setting of AKI. -Continue metoprolol.  7.  Paroxysmal A-fib -Continue metoprolol for rate control. -Not on anticoagulation prior to admission.  8.  Chronic diastolic CHF/aortic stenosis -2D echo from September 2024 with a EF of 60 to 65%, grade 2 DD, normal RV function, moderate to severe AS. -Diuretics on hold in the setting of AKI and patient noted to be slightly dehydrated on presentation. -Continue beta-blocker.  9.  Chronic respiratory failure with hypoxia -Noted not to be using home  O2 for the past week per admitting physician. -Continue supplemental oxygen.  10.  CAD -Stable. -Continue metoprolol, pravastatin.  11.   CMML/pancytopenia -Being followed by hematology in the outpatient setting and noted to be receiving Aranesp. -White count currently at 12.2 this morning, hemoglobin is 7.3, platelet count at 39. -Patient with no overt bleeding. -Outpatient follow-up with primary hematologist.  12.  History of breast cancer status postlumpectomy -S/p lumpectomy 5 years of tamoxifen. -Outpatient follow-up with hematology/oncology.  13.  History of lung cancer -Status post left upper lobe lobectomy. -Outpatient follow-up with oncology.  14.  Hypothyroidism/COPD -Continue chronic home O2. -Placed on Dulera as patient noted to be on Symbicort prior to admission, resume home regimen Trelegy. -Resume home regimen Synthroid.  15.  Goals of care -Per admitting physician grandson, HCPOA and MD requested palliative care consultation for goals of care. -Palliative care consultation placed.   DVT prophylaxis: SCDs Code Status: Full Family Communication: Updated patient.  No family at bedside. Disposition: TBD  Status is: Inpatient The patient will require care spanning > 2 midnights and should be moved to inpatient because: Severity of illness   Consultants:  None  Procedures:  CT head 12/03/2023 Chest x-ray 12/03/2023   Antimicrobials:  Anti-infectives (From admission, onward)    Start     Dose/Rate Route Frequency Ordered Stop   12/04/23 1900  cefTRIAXone (ROCEPHIN) 2 g in sodium chloride 0.9 % 100 mL IVPB        2 g 200 mL/hr over 30 Minutes Intravenous Every 24 hours 12/04/23 0839     12/03/23 1900  cefTRIAXone (ROCEPHIN) 1 g in sodium chloride 0.9 % 100 mL IVPB  Status:  Discontinued        1 g 200 mL/hr over 30 Minutes Intravenous Every 24 hours 12/03/23 1820 12/04/23 0839         Subjective: Patient sitting up in bed complaining of bilateral heel pain that started last night.  Denies any chest pain or shortness of breath.  No abdominal pain.  Alert and oriented to self place and time.   Unsure of the month but knows it is 2025.  Overall feeling better than she did on admission.  Objective: Vitals:   12/04/23 0749 12/04/23 1121 12/04/23 1200 12/04/23 1206  BP: (!) 111/49 (!) 106/51 (!) 168/60   Pulse: 95 (!) 108 (!) 104   Resp:  (!) 30 16 (!) 22  Temp: 99.7 F (37.6 C) 99.2 F (37.3 C)    TempSrc: Oral Oral    SpO2: 100%   98%  Weight:      Height:        Intake/Output Summary (Last 24 hours) at 12/04/2023 1619 Last data filed at 12/04/2023 1548 Gross per 24 hour  Intake 707.53 ml  Output 1350 ml  Net -642.47 ml   Filed Weights   12/03/23 1537  Weight: 59.9 kg    Examination:  General exam: Appears calm and comfortable  Respiratory system: Clear to auscultation. Respiratory effort normal. Cardiovascular system: S1 & S2 heard, RRR with 3/6 SEM. No JVD, murmurs, rubs, gallops or clicks. No pedal edema. Gastrointestinal system: Abdomen is nondistended, soft and nontender. No organomegaly or masses felt. Normal bowel sounds heard. Central nervous system: Alert and oriented.  Moving extremities spontaneously.  No focal neurological deficits. Extremities: Symmetric 5 x 5 power. Skin: No rashes, lesions or ulcers Psychiatry: Judgement and insight appear normal. Mood & affect appropriate.     Data Reviewed: I have personally reviewed following  labs and imaging studies  CBC: Recent Labs  Lab 12/03/23 1325 12/03/23 1331 12/03/23 1757 12/04/23 0757  WBC 9.4  --   --  12.2*  HGB 8.2* 9.5* 8.8* 7.3*  HCT 28.2* 28.0* 26.0* 24.6*  MCV 102.9*  --   --  99.2  PLT 41*  --   --  39*    Basic Metabolic Panel: Recent Labs  Lab 12/03/23 1325 12/03/23 1331 12/03/23 1757 12/03/23 2046 12/04/23 0757 12/04/23 0758  NA 129* 130* 136 128* 129*  --   K 6.3* 6.4* 4.9 5.5* 5.5*  --   CL 97*  --   --  95* 96*  --   CO2 22  --   --  21* 24  --   GLUCOSE 96  --   --  97 90  --   BUN 32*  --   --  33* 27*  --   CREATININE 1.67*  --   --  1.63* 1.38*  --   CALCIUM  9.2  --   --  9.7 9.1  --   MG  --   --   --   --   --  1.6*    GFR: Estimated Creatinine Clearance: 27.2 mL/min (A) (by C-G formula based on SCr of 1.38 mg/dL (H)).  Liver Function Tests: Recent Labs  Lab 12/03/23 1325 12/03/23 2046 12/04/23 0757  AST 37 39 32  ALT 12 11 10   ALKPHOS 42 39 34*  BILITOT 1.7* 1.2 1.3*  PROT 9.4* 9.3* 8.0  ALBUMIN 3.2* 3.1* 2.7*    CBG: Recent Labs  Lab 12/03/23 1129  GLUCAP 91     Recent Results (from the past 240 hours)  Microscopic Examination     Status: None   Collection Time: 12/01/23  9:54 AM   Urine  Result Value Ref Range Status   WBC, UA 0-5 0 - 5 /hpf Final   RBC, Urine 0-2 0 - 2 /hpf Final   Epithelial Cells (non renal) 0-10 0 - 10 /hpf Final   Bacteria, UA None seen None seen/Few Final  Culture, blood (routine x 2)     Status: None (Preliminary result)   Collection Time: 12/03/23  1:25 PM   Specimen: BLOOD  Result Value Ref Range Status   Specimen Description BLOOD RIGHT ANTECUBITAL  Final   Special Requests   Final    BOTTLES DRAWN AEROBIC AND ANAEROBIC Blood Culture results may not be optimal due to an inadequate volume of blood received in culture bottles   Culture   Final    NO GROWTH < 24 HOURS Performed at [facility], [address] [place], [place] [address]    Report Status PENDING  Incomplete  Culture, blood (routine x 2)     Status: None (Preliminary result)   Collection Time: 12/03/23  1:25 PM   Specimen: BLOOD  Result Value Ref Range Status   Specimen Description BLOOD RIGHT ANTECUBITAL  Final   Special Requests   Final    AEROBIC BOTTLE ONLY Blood Culture results may not be optimal due to an inadequate volume of blood received in culture bottles   Culture   Final    NO GROWTH < 24 HOURS Performed at [facility], [address], [place], [place] [address]    Report Status PENDING  Incomplete         Radiology Studies: CT Head Wo Contrast Result Date: 12/03/2023 CLINICAL DATA:   Mental status change,  unknown cause. Altered mental status. EXAM: CT HEAD WITHOUT CONTRAST TECHNIQUE: Contiguous axial images were obtained from the base of the skull through the vertex without intravenous contrast. RADIATION DOSE REDUCTION: This exam was performed according to the departmental dose-optimization program which includes automated exposure control, adjustment of the mA and/or kV according to patient size and/or use of iterative reconstruction technique. COMPARISON:  Head CT 05/20/2023 and MRI 02/10/2022 FINDINGS: Brain: There is no evidence of an acute infarct, intracranial hemorrhage, mass, midline shift, or extra-axial fluid collection. Generalized cerebral atrophy is mild for age. Confluent hypodensities in the cerebral white matter are stable to mildly progressive and nonspecific but compatible with severe chronic small vessel ischemic disease. Vascular: Calcified atherosclerosis at the skull base. No hyperdense vessel. Skull: No acute fracture or suspicious osseous lesion. Sinuses/Orbits: Mild mucosal thickening and small mucous retention cyst in the left maxillary sinus. No significant mastoid fluid. Bilateral cataract extraction. Other: None. IMPRESSION: 1. No evidence of acute intracranial abnormality. 2. Severe chronic small vessel ischemic disease. Electronically Signed   By: Sebastian Ache M.D.   On: 12/03/2023 14:08   DG Chest Port 1 View Result Date: 12/03/2023 CLINICAL DATA:  Altered mental status. EXAM: PORTABLE CHEST 1 VIEW COMPARISON:  X-ray 08/11/2023. FINDINGS: Enlarged cardiopericardial silhouette. Calcified tortuous aorta. Interstitial changes are again seen but improved from previous. Surgical changes medial left lung apex. Tiny pleural effusions. Overlapping cardiac leads. Degenerative changes of the spine. IMPRESSION: Enlarged heart. Small prominent central vasculature with some mild interstitial changes but less than prior. Question tiny effusions. Postsurgical changes left  lung Electronically Signed   By: Karen Kays M.D.   On: 12/03/2023 12:22        Scheduled Meds:  estradiol  1 Applicatorful Vaginal QHS   fluticasone furoate-vilanterol  1 puff Inhalation Daily   And   umeclidinium bromide  1 puff Inhalation Daily   [START ON 12/05/2023] levothyroxine  100 mcg Oral QAC breakfast   metoprolol succinate  25 mg Oral Daily   mirtazapine  30 mg Oral QHS   mometasone-formoterol  2 puff Inhalation BID   OLANZapine  5 mg Oral Daily   pantoprazole  40 mg Oral Q0600   [START ON 12/05/2023] pantoprazole  40 mg Oral Daily   pravastatin  40 mg Oral QPM   sodium chloride flush  3 mL Intravenous Q12H   sodium zirconium cyclosilicate  10 g Oral BID   traZODone  100 mg Oral QHS   Continuous Infusions:  sodium chloride 75 mL/hr at 12/04/23 1548   cefTRIAXone (ROCEPHIN)  IV       LOS: 0 days    Time spent: 40 minutes    Ramiro Harvest, MD [facility]   To contact the attending provider between 7A-7P or the covering provider during after hours 7P-7A, please log into the web site [email or web address] and access using universal  password for that web site. If you do not have the password, please call the hospital operator.  12/04/2023, 4:19 PM

## 2023-12-04 NOTE — Progress Notes (Signed)
   12/04/23 1704  Respiratory  Respiratory Pattern Tachypnea  Chest Assessment Chest expansion symmetrical  Bilateral Breath Sounds Diminished  Respiratory (WDL) X   Received order to DC IVF

## 2023-12-04 NOTE — Care Management Obs Status (Signed)
MEDICARE OBSERVATION STATUS NOTIFICATION   Patient Details  Name: Lisa [name] MRN: [id number] Date of Birth: 10-10-1934   Medicare Observation Status Notification Given:     LVM w patient's daughter requesting callback   Lawerance Sabal, RN 12/04/2023, 2:21 PM

## 2023-12-04 NOTE — Care Management Obs Status (Signed)
MEDICARE OBSERVATION STATUS NOTIFICATION   Patient Details  Name: Lisa [name] MRN: [id number] Date of Birth: [birth year]   Medicare Observation Status Notification Given:  Yes    Lawerance Sabal, RN 12/04/2023, 2:53 PM

## 2023-12-04 NOTE — Plan of Care (Signed)
   Problem: Clinical Measurements: Goal: Respiratory complications will improve Outcome: Progressing

## 2023-12-04 NOTE — Plan of Care (Signed)
  Problem: Clinical Measurements: Goal: Diagnostic test results will improve Outcome: Progressing   Problem: Nutrition: Goal: Adequate nutrition will be maintained Outcome: Progressing   Problem: Coping: Goal: Level of anxiety will decrease Outcome: Progressing   Problem: Elimination: Goal: Will not experience complications related to urinary retention Outcome: Progressing   Problem: Safety: Goal: Ability to remain free from injury will improve Outcome: Progressing   Problem: Skin Integrity: Goal: Risk for impaired skin integrity will decrease Outcome: Progressing

## 2023-12-04 DEATH — deceased

## 2023-12-05 ENCOUNTER — Inpatient Hospital Stay (HOSPITAL_COMMUNITY): Payer: Medicare Other

## 2023-12-05 DIAGNOSIS — D649 Anemia, unspecified: Secondary | ICD-10-CM

## 2023-12-05 DIAGNOSIS — N1831 Chronic kidney disease, stage 3a: Secondary | ICD-10-CM | POA: Diagnosis not present

## 2023-12-05 DIAGNOSIS — J9610 Chronic respiratory failure, unspecified whether with hypoxia or hypercapnia: Secondary | ICD-10-CM

## 2023-12-05 DIAGNOSIS — N390 Urinary tract infection, site not specified: Secondary | ICD-10-CM | POA: Diagnosis not present

## 2023-12-05 DIAGNOSIS — G9341 Metabolic encephalopathy: Secondary | ICD-10-CM

## 2023-12-05 DIAGNOSIS — A419 Sepsis, unspecified organism: Secondary | ICD-10-CM

## 2023-12-05 DIAGNOSIS — E875 Hyperkalemia: Secondary | ICD-10-CM

## 2023-12-05 DIAGNOSIS — J449 Chronic obstructive pulmonary disease, unspecified: Secondary | ICD-10-CM

## 2023-12-05 DIAGNOSIS — E871 Hypo-osmolality and hyponatremia: Secondary | ICD-10-CM

## 2023-12-05 LAB — CORTISOL: Cortisol, Plasma: 18.4 ug/dL

## 2023-12-05 LAB — CBC
HCT: 23 % — ABNORMAL LOW (ref 36.0–46.0)
Hemoglobin: 6.8 g/dL — CL (ref 12.0–15.0)
MCH: 29.8 pg (ref 26.0–34.0)
MCHC: 29.6 g/dL — ABNORMAL LOW (ref 30.0–36.0)
MCV: 100.9 fL — ABNORMAL HIGH (ref 80.0–100.0)
Platelets: 38 10*3/uL — ABNORMAL LOW (ref 150–400)
RBC: 2.28 MIL/uL — ABNORMAL LOW (ref 3.87–5.11)
RDW: 20.9 % — ABNORMAL HIGH (ref 11.5–15.5)
WBC: 13.1 10*3/uL — ABNORMAL HIGH (ref 4.0–10.5)
nRBC: 7.3 % — ABNORMAL HIGH (ref 0.0–0.2)

## 2023-12-05 LAB — BASIC METABOLIC PANEL
Anion gap: 8 (ref 5–15)
BUN: 25 mg/dL — ABNORMAL HIGH (ref 8–23)
CO2: 19 mmol/L — ABNORMAL LOW (ref 22–32)
Calcium: 8.3 mg/dL — ABNORMAL LOW (ref 8.9–10.3)
Chloride: 98 mmol/L (ref 98–111)
Creatinine, Ser: 1.27 mg/dL — ABNORMAL HIGH (ref 0.44–1.00)
GFR, Estimated: 41 mL/min — ABNORMAL LOW (ref >60)
Glucose, Bld: 126 mg/dL — ABNORMAL HIGH (ref 70–99)
Potassium: 5.2 mmol/L — ABNORMAL HIGH (ref 3.5–5.1)
Sodium: 125 mmol/L — ABNORMAL LOW (ref 135–145)

## 2023-12-05 LAB — RENAL FUNCTION PANEL
Albumin: 2.7 g/dL — ABNORMAL LOW (ref 3.5–5.0)
Anion gap: 8 (ref 5–15)
BUN: 25 mg/dL — ABNORMAL HIGH (ref 8–23)
CO2: 18 mmol/L — ABNORMAL LOW (ref 22–32)
Calcium: 8.3 mg/dL — ABNORMAL LOW (ref 8.9–10.3)
Chloride: 99 mmol/L (ref 98–111)
Creatinine, Ser: 1.27 mg/dL — ABNORMAL HIGH (ref 0.44–1.00)
GFR, Estimated: 41 mL/min — ABNORMAL LOW (ref >60)
Glucose, Bld: 124 mg/dL — ABNORMAL HIGH (ref 70–99)
Phosphorus: 4 mg/dL (ref 2.5–4.6)
Potassium: 5.3 mmol/L — ABNORMAL HIGH (ref 3.5–5.1)
Sodium: 125 mmol/L — ABNORMAL LOW (ref 135–145)

## 2023-12-05 LAB — HEMOGLOBIN AND HEMATOCRIT, BLOOD
HCT: 25.2 % — ABNORMAL LOW (ref 36.0–46.0)
Hemoglobin: 7.9 g/dL — ABNORMAL LOW (ref 12.0–15.0)

## 2023-12-05 LAB — PREPARE RBC (CROSSMATCH)

## 2023-12-05 LAB — GLUCOSE, CAPILLARY: Glucose-Capillary: 124 mg/dL — ABNORMAL HIGH (ref 70–99)

## 2023-12-05 LAB — MRSA NEXT GEN BY PCR, NASAL: MRSA by PCR Next Gen: NOT DETECTED

## 2023-12-05 LAB — LACTIC ACID, PLASMA
Lactic Acid, Venous: 2.9 mmol/L (ref 0.5–1.9)
Lactic Acid, Venous: 1.3 mmol/L (ref 0.5–1.9)

## 2023-12-05 LAB — MAGNESIUM: Magnesium: 2 mg/dL (ref 1.7–2.4)

## 2023-12-05 MED ORDER — NOREPINEPHRINE 4 MG/250ML-% IV SOLN
2.0000 ug/min | INTRAVENOUS | Status: DC
  Administered 2023-12-05: 2 ug/min via INTRAVENOUS
  Filled 2023-12-05: qty 250

## 2023-12-05 MED ORDER — LEVOTHYROXINE SODIUM 100 MCG/5ML IV SOLN: 75.0000 ug | Freq: Every day | INTRAVENOUS | Status: DC

## 2023-12-05 MED ORDER — SODIUM ZIRCONIUM CYCLOSILICATE 10 G PO PACK
10.0000 g | PACK | Freq: Two times a day (BID) | ORAL | Status: DC
  Administered 2023-12-05: 10 g via ORAL
  Filled 2023-12-05: qty 1

## 2023-12-05 MED ORDER — SODIUM CHLORIDE 0.9 % IV SOLN
250.0000 mL | INTRAVENOUS | Status: DC
  Administered 2023-12-05: 250 mL via INTRAVENOUS

## 2023-12-05 MED ORDER — SODIUM CHLORIDE 0.9 % IV BOLUS
500.0000 mL | INTRAVENOUS | Status: DC
Start: 2023-12-05 — End: 2023-12-05

## 2023-12-05 MED ORDER — INSULIN ASPART 100 UNIT/ML IV SOLN
5.0000 [IU] | INTRAVENOUS | Status: AC
  Administered 2023-12-05: 5 [IU] via INTRAVENOUS

## 2023-12-05 MED ORDER — LORAZEPAM 2 MG/ML IJ SOLN
1.0000 mg | INTRAMUSCULAR | Status: AC
  Administered 2023-12-05: 1 mg via INTRAVENOUS
  Filled 2023-12-05: qty 1

## 2023-12-05 MED ORDER — ALBUMIN HUMAN 25 % IV SOLN
50.0000 g | INTRAVENOUS | Status: AC
  Administered 2023-12-05: 50 g via INTRAVENOUS
  Filled 2023-12-05: qty 200

## 2023-12-05 MED ORDER — OLANZAPINE 5 MG PO TABS
5.0000 mg | ORAL_TABLET | Freq: Every day | ORAL | Status: DC
  Administered 2023-12-05 – 2023-12-09 (×5): 5 mg via ORAL
  Filled 2023-12-05 (×7): qty 1

## 2023-12-05 MED ORDER — SODIUM CHLORIDE 0.9 % IV BOLUS
1000.0000 mL | INTRAVENOUS | Status: AC
  Administered 2023-12-05: 1000 mL via INTRAVENOUS

## 2023-12-05 MED ORDER — SODIUM CHLORIDE 0.9 % IV BOLUS: 1000.0000 mL | INTRAVENOUS | Status: DC

## 2023-12-05 MED ORDER — DIPHENHYDRAMINE HCL 50 MG/ML IJ SOLN
25.0000 mg | INTRAMUSCULAR | Status: AC
  Administered 2023-12-05: 25 mg via INTRAVENOUS
  Filled 2023-12-05: qty 1

## 2023-12-05 MED ORDER — SODIUM CHLORIDE 0.9 % IV SOLN: INTRAVENOUS | Status: DC

## 2023-12-05 MED ORDER — DEXTROSE 50 % IV SOLN
1.0000 | Freq: Once | INTRAVENOUS | Status: AC
  Administered 2023-12-05: 50 mL via INTRAVENOUS
  Filled 2023-12-05: qty 50

## 2023-12-05 MED ORDER — LORAZEPAM 2 MG/ML IJ SOLN: 0.5000 mg | Freq: Four times a day (QID) | INTRAMUSCULAR | Status: DC | PRN

## 2023-12-05 MED ORDER — CALCIUM GLUCONATE-NACL 1-0.675 GM/50ML-% IV SOLN
1.0000 g | Freq: Once | INTRAVENOUS | Status: AC
  Administered 2023-12-05: 1000 mg via INTRAVENOUS
  Filled 2023-12-05: qty 50

## 2023-12-05 MED ORDER — LORAZEPAM 2 MG/ML IJ SOLN: 0.5000 mg | INTRAMUSCULAR | Status: DC

## 2023-12-05 MED ORDER — CHLORHEXIDINE GLUCONATE CLOTH 2 % EX PADS
6.0000 | MEDICATED_PAD | Freq: Every day | CUTANEOUS | Status: DC
  Administered 2023-12-05 – 2023-12-10 (×6): 6 via TOPICAL

## 2023-12-05 MED ORDER — SODIUM CHLORIDE 0.9% IV SOLUTION: Freq: Once | INTRAVENOUS | Status: DC

## 2023-12-05 MED ORDER — MIDODRINE HCL 5 MG PO TABS: 10.0000 mg | ORAL_TABLET | Freq: Once | ORAL | Status: DC

## 2023-12-05 MED ORDER — LORAZEPAM 2 MG/ML IJ SOLN: 1.0000 mg | Freq: Four times a day (QID) | INTRAMUSCULAR | Status: DC | PRN

## 2023-12-05 MED ORDER — MAGNESIUM SULFATE 2 GM/50ML IV SOLN
2.0000 g | Freq: Once | INTRAVENOUS | Status: AC
  Administered 2023-12-05: 2 g via INTRAVENOUS
  Filled 2023-12-05: qty 50

## 2023-12-05 MED ORDER — OLANZAPINE 5 MG PO TABS: 5.0000 mg | ORAL_TABLET | Freq: Every day | ORAL | Status: DC

## 2023-12-05 MED ORDER — MIDODRINE HCL 5 MG PO TABS
10.0000 mg | ORAL_TABLET | ORAL | Status: AC
  Administered 2023-12-05: 10 mg via ORAL
  Filled 2023-12-05: qty 2

## 2023-12-05 NOTE — Progress Notes (Signed)
Asked to start pt on levophed gtt for hypotension and expedite tx to ICU. Levo gtt started an titrated per order parameters. Pt transferred to 2H03 with 3W RN and RRT.

## 2023-12-05 NOTE — Progress Notes (Addendum)
RN reported that patient is agitated trying to get out of the bed.  Also reported that patient is tachycardic.  Informed RN that patient is having reflex tachycardia in the setting of hypotension blood pressure 80/50.  Giving a stat 1 L of NS bolus, albumin 50 g for management of hypotension and afterward continue maintenance fluid NS 125 cc/h.  As patient is agitated giving 1 mg of Ativan, continue delirium precaution and fall precaution.  Update, patient blood pressure is persistently soft.  Giving another 1 liter of NS bolus, midodrine 10 mg. -BMP showing sodium trended down to 1 29-1 25.  Elevated potassium 5.2 and low bicarb 19.  Creatinine has been improved.  Mag within normal range.  CBC in process  Given treatment for hyperkalemia. -Overnight patient received total 2 L of NS bolus, midodrine 10 mg, albumin 50 g and currently on NS 125 cc/h.  Tereasa Coop, MD [facility] 12/05/2023, 4:05 AM

## 2023-12-05 NOTE — Progress Notes (Signed)
Patient's RN reported that blood pressure is soft 80/50.  Per chart review patient has been admitted for acute metabolic encephalopathy in the setting of UTI.  Patient's EF is 60 to 65%.  Giving patient NS 1 L bolus and albumin 50 g.   Tereasa Coop, MD [facility] 12/05/2023, 12:15 AM

## 2023-12-05 NOTE — Progress Notes (Signed)
PHARMACIST - PHYSICIAN COMMUNICATION  CONCERNING:  IV levothyroxine  DESCRIPTION: Order for IV levothyroxine has been noted.  Per P&T policy will hold IV levothyroxine x7d after last oral dose (12/03/23).  Per notes pt expected to resume PO intake soon.  Vernard Gambles, PharmD, BCPS 12/05/2023 4:48 AM

## 2023-12-05 NOTE — Consult Note (Signed)
NAME:  Lisa [name], MRN:  [id number], DOB:  [birth year], LOS: 1 ADMISSION DATE:  12/03/2023, CONSULTATION DATE: 12/05/2023 REFERRING MD: Dr. Janalyn Shy, CHIEF COMPLAINT: Hypotension, sepsis  History of Present Illness:  89 year old lady being treated for metabolic encephalopathy likely secondary to urinary tract infection with multiple comorbidities who is noted to be hypotensive, remains hypotensive despite ongoing interventions including fluid resuscitation, midodrine, albumin infusion.  Multiple underlying comorbidities including myelodysplastic syndrome, past history of adenocarcinoma of the left lung, invasive ductal carcinoma of the left breast which is being treated, stage III chronic kidney disease, paroxysmal atrial fibrillation, chronic diastolic heart failure, history of chronic myelomonocytic leukemia, chronic obstructive pulmonary disease, chronic respiratory failure  Notably was more alert, oriented on 2 /1 and feeling better than when she was admitted the previous day.  Goals of care discussions ongoing, palliative care has been consulted  Pertinent  Medical History   Past Medical History:  Diagnosis Date   AAA (abdominal aortic aneurysm) (HCC)    Adenocarcinoma of left lung (HCC) 2006   AF (paroxysmal atrial fibrillation) (HCC)    Aortic stenosis    Arthritis    Asthma    Cancer of breast, female (HCC)    Cancer of lung (HCC)    Dysrhythmia    Hypertension    Hypothyroidism    Invasive ductal carcinoma of left breast (HCC) 1999   Mitral regurgitation    Myocardial infarction (HCC)    Neutropenia (HCC) 06/09/2016   NSTEMI (non-ST elevated myocardial infarction) (HCC) 04/06/2023   Personal history of radiation therapy    Pulmonary hypertension (HCC)    TIA (transient ischemic attack) 02/10/2022     Significant Hospital Events: Including procedures, antibiotic start and stop dates in addition to other pertinent events   Admitted 12/04/2023 CT head 12/03/2023-no  acute abnormality Chest x-ray 12/03/2023 no acute infiltrate, cardiomegaly, small effusions PCCM consult for persistent hypotension  Interim History / Subjective:  Elderly, very frail, sleepy -Was given Benadryl, benzodiazepine for agitation  Objective   Blood pressure (!) 79/50, pulse 96, temperature 99.4 F (37.4 C), temperature source Oral, resp. rate 16, height 5\' 7"  (1.702 m), weight 59.9 kg, SpO2 99%.        Intake/Output Summary (Last 24 hours) at 12/05/2023 0505 Last data filed at 12/05/2023 0301 Gross per 24 hour  Intake 1613.18 ml  Output 700 ml  Net 913.18 ml   Filed Weights   12/03/23 1537  Weight: 59.9 kg    Examination: General: Elderly, frail, chronically ill-appearing HENT: Moist oral mucosa Lungs: Decreased air movement bilaterally Cardiovascular: S1-S2 appreciated Abdomen: Soft, bowel sounds appreciated Extremities: No clubbing, no edema Neuro: Sleepy but arousable GU:   Resolved Hospital Problem list     Assessment & Plan:  Persistent hypotension secondary to sepsis, urinary tract infection for which she is on antibiotics -Will start peripheral pressors -Transfer to ICU -With ongoing multiple comorbidities, need to focus on goals of care discussions with palliative care involvement -Decrease sedating medications  Acute metabolic encephalopathy Secondary to UTI -On Rocephin -Follow urine cultures  Chronic kidney disease stage III yea Per kalemia -Received Lokelma -Improvement in renal function noted with hydration  Hyponatremia/hyperkalemia -Check cortisol -May be secondary to kidney disease, heart failure  Anemia -This may be dilutional -May have been hemoconcentrated at presentation, noted to be dehydrated at presentation. -No evidence of bleeding has been noted -However hemoglobin less than 7, ordered unit of blood  History of hypertension -Will hold antihypertensives at present, on metoprolol  History of chronic diastolic  congestive heart failure -Most recent echocardiogram with ejection fraction of 60 to 65%, grade 2 diastolic dysfunction, does have moderate aortic stenosis -Hold diuretics  Chronic respiratory failure COPD PFT from 2018 with severe obstructive disease -Continue bronchodilators -On Breo, Incruse  History of pancytopenia, CMML -On Aranesp -Follows up with hematology  History of breast cancer -S/p lumpectomy, remains on tamoxifen  History of lung cancer status post left upper lobectomy  Hypothyroidism -On Synthroid  Limit sedating medications Goals of care discussions  Best Practice (right click and "Reselect all SmartList Selections" daily)   Diet/type: Regular consistency (see orders) DVT prophylaxis other, held because of H&H drop Pressure ulcer(s): N/A GI prophylaxis: N/A Lines: N/A Foley:  N/A Code Status:  full code Last date of multidisciplinary goals of care discussion [ongoing discussions per primary]  Labs   CBC: Recent Labs  Lab 12/03/23 1325 12/03/23 1331 12/03/23 1757 12/04/23 0757 12/05/23 0325  WBC 9.4  --   --  12.2* 13.1*  HGB 8.2* 9.5* 8.8* 7.3* 6.8*  HCT 28.2* 28.0* 26.0* 24.6* 23.0*  MCV 102.9*  --   --  99.2 100.9*  PLT 41*  --   --  39* 38*    Basic Metabolic Panel: Recent Labs  Lab 12/03/23 1325 12/03/23 1331 12/03/23 1757 12/03/23 2046 12/04/23 0757 12/04/23 0758 12/04/23 1623 12/05/23 0321 12/05/23 0323  NA 129*   < > 136 128* 129*  --   --  125* 125*  K 6.3*   < > 4.9 5.5* 5.5*  --  4.6 5.2* 5.3*  CL 97*  --   --  95* 96*  --   --  98 99  CO2 22  --   --  21* 24  --   --  19* 18*  GLUCOSE 96  --   --  97 90  --   --  126* 124*  BUN 32*  --   --  33* 27*  --   --  25* 25*  CREATININE 1.67*  --   --  1.63* 1.38*  --   --  1.27* 1.27*  CALCIUM 9.2  --   --  9.7 9.1  --   --  8.3* 8.3*  MG  --   --   --   --   --  1.6*  --  2.0  --   PHOS  --   --   --   --   --   --   --   --  4.0   < > = values in this interval not  displayed.   GFR: Estimated Creatinine Clearance: 29.5 mL/min (A) (by C-G formula based on SCr of 1.27 mg/dL (H)). Recent Labs  Lab 12/03/23 1325 12/03/23 1332 12/04/23 0757 12/05/23 0325  WBC 9.4  --  12.2* 13.1*  LATICACIDVEN  --  0.9  --   --     Liver Function Tests: Recent Labs  Lab 12/03/23 1325 12/03/23 2046 12/04/23 0757 12/05/23 0323  AST 37 39 32  --   ALT 12 11 10   --   ALKPHOS 42 39 34*  --   BILITOT 1.7* 1.2 1.3*  --   PROT 9.4* 9.3* 8.0  --   ALBUMIN 3.2* 3.1* 2.7* 2.7*   No results for input(s): "LIPASE", "AMYLASE" in the last 168 hours. No results for input(s): "AMMONIA" in the last 168 hours.  ABG    Component Value Date/Time   PHART 7.26 (L)  08/04/2023 1112   PCO2ART 56 (H) 08/04/2023 1112   PO2ART 31 (LL) 08/04/2023 1112   HCO3 23.5 12/03/2023 1757   TCO2 25 12/03/2023 1757   ACIDBASEDEF 3.0 (H) 12/03/2023 1757   O2SAT 53 12/03/2023 1757     Coagulation Profile: No results for input(s): "INR", "PROTIME" in the last 168 hours.  Cardiac Enzymes: No results for input(s): "CKTOTAL", "CKMB", "CKMBINDEX", "TROPONINI" in the last 168 hours.  HbA1C: Hgb A1c MFr Bld  Date/Time Value Ref Range Status  02/11/2022 05:05 AM 4.4 (L) 4.8 - 5.6 % Final    Comment:    (NOTE) Pre diabetes:          5.7%-6.4%  Diabetes:              >6.4%  Glycemic control for   <7.0% adults with diabetes     CBG: Recent Labs  Lab 12/03/23 1129  GLUCAP 91    Review of Systems:   Unable to provide history  Past Medical History:  She,  has a past medical history of AAA (abdominal aortic aneurysm) (HCC), Adenocarcinoma of left lung (HCC) (2006), AF (paroxysmal atrial fibrillation) (HCC), Aortic stenosis, Arthritis, Asthma, Cancer of breast, female (HCC), Cancer of lung (HCC), Dysrhythmia, Hypertension, Hypothyroidism, Invasive ductal carcinoma of left breast (HCC) (1999), Mitral regurgitation, Myocardial infarction (HCC), Neutropenia (HCC) (06/09/2016), NSTEMI  (non-ST elevated myocardial infarction) (HCC) (04/06/2023), Personal history of radiation therapy, Pulmonary hypertension (HCC), and TIA (transient ischemic attack) (02/10/2022).   Surgical History:   Past Surgical History:  Procedure Laterality Date   BIOPSY  10/23/2020   Procedure: BIOPSY;  Surgeon: Benancio Deeds, MD;  Location: [facility] ENDOSCOPY;  Service: Gastroenterology;;   BREAST BIOPSY     left axillary node dissection   BREAST LUMPECTOMY Left    COLONOSCOPY  03/08/2003   WUJ:WJXBJYNWGN rectal polyps destroyed with the tip of the snare/Polyps at hepatic flexure, splenic flexure at 35 cm/Left-sided diverticula: unable to retrieve path   COLONOSCOPY  09/12/2008   FAO:ZHYQMV rectum and distal sigmoid diminutive polyps/scattered left sided diverticulum. hyperplastic   COLONOSCOPY N/A 03/06/2013   HQI:ONGEXBM polyp-removed as described above; colonic diverticulosis. hyperplastic polyps. next TCS 03/2018   ESOPHAGOGASTRODUODENOSCOPY (EGD) WITH PROPOFOL N/A 10/23/2020   Procedure: ESOPHAGOGASTRODUODENOSCOPY (EGD) WITH PROPOFOL;  Surgeon: Benancio Deeds, MD;  Location: [facility] ENDOSCOPY;  Service: Gastroenterology;  Laterality: N/A;   FOOT SURGERY     LUNG REMOVAL, PARTIAL     upper lobe     Social History:   reports that she quit smoking about 32 years ago. Her smoking use included cigarettes. She started smoking about 67 years ago. She has a 26.3 pack-year smoking history. She has never used smokeless tobacco. She reports that she does not drink alcohol and does not use drugs.   Family History:  Her family history includes Cancer in her mother and sister. There is no history of Colon cancer.   Allergies Allergies  Allergen Reactions   Meloxicam Other (See Comments)    Caused an injury to the kidneys, per nephrologist   Micardis Hct [Telmisartan-Hctz] Other (See Comments)    Caused an injury to the kidneys, per nephrologist    The patient is critically ill with multiple organ  systems failure and requires high complexity decision making for assessment and support, frequent evaluation and titration of therapies, application of advanced monitoring technologies and extensive interpretation of multiple databases. Critical Care Time devoted to patient care services described in this note independent of APP/resident time (if applicable)  is 33 minutes.   Virl Diamond MD [facility] Personal pager: See Amion If unanswered, please page CCM On-call: [phone]

## 2023-12-05 NOTE — Progress Notes (Signed)
eLink Physician-Brief Progress Note Patient Name: Lisa [name] DOB: 05-09-1934 MRN: [id number]   Date of Service  12/05/2023  HPI/Events of Note  89 year old with a history of recent NSTEMI, TIA, CKD, myelodysplastic syndrome, pulmonary hypertension, and heart failure who presents to the emergency department with acute encephalopathy, hypotension to have septic shock likely secondary to urinary tract infection and referred to ICU for further management.  Patient is tachypneic, tachycardic, and mildly hypertensive saturating 95% on 2 L of oxygen.  Started on low-dose norepinephrine.  Results consistent with elevated creatinine, macrocytic anemia and leukocytosis with thrombocytopenia.  PRBC ordered.  CT head performed on 12/03/2023 did not show evidence of acute intracranial abnormalities  eICU Interventions  Maintain MAP greater than 65, peripheral vasopressors in place  Encephalopathy likely secondary to UTI.  Maintain Rocephin.  CT head negative.  1 unit of PRBC pending.  DVT prophylaxis with SCDs in the setting of potential bleeding GI prophylaxis not currently indicated     Intervention Category Evaluation Type: New Patient Evaluation  Cayetano Mikita 12/05/2023, 6:47 AM

## 2023-12-05 NOTE — Progress Notes (Addendum)
Patient is persistently hypotensive even with 2 L of NS bolus and currently on NS 125 cc/h.  Patient got albumin 50 g and midodrine 10 mg.  MAP is around 60-64.  Giving the third liter of NS bolus now.  Patient is afebrile.  CBC no evidence of leukocytosis except hemoglobin dropped to 6.8. There is no extensive source of infection other than treating for UTI currently on IV ceftriaxone.  Hard to explain for persistent hypotension.  Patient is not septic.  Checking lactic acid level.  Consulted and informed Dr. Roseanna Rainbow for evaluation for for persistent hypotension.   Tereasa Coop, MD [facility] 12/05/2023, 4:44 AM

## 2023-12-05 NOTE — Progress Notes (Signed)
RN reported that patient is drowsy after giving the Benadryl.  Initially patient was very agitated not following any command and trying to get out out of the bed multiple times which is why I have to give Benadryl and Ativan 1 mg.  -As RN reported that unable to give oral Protonix and levothyroxine I am switching to IV for the time being for today and can switch back to oral medications once patient is more alert.   Tereasa Coop, MD [facility] 12/05/2023, 4:28 AM

## 2023-12-05 NOTE — Progress Notes (Signed)
RN reported that lab called patient hemoglobin dropped to 6.8.  Per chart review during admission patient hemoglobin was 8.8 which has been gradually dropped to 6.8.  Patient does not have any active bleeding.  CBC showing hemoglobin 6.8, MCV 6000 and hematocrit 23. -Checking FOBT. - Holding any IV pharmacological DVT prophylaxis - Preparing 2 units of blood and transfusing 1 unit of blood.  Tereasa Coop, MD [facility] 12/05/2023, 4:11 AM

## 2023-12-05 NOTE — Progress Notes (Addendum)
Chronically ill 89 yo F who was admitted to [facility] 1/31 for tx of UTI, acute encephalopathy. 2/2 early AM was hypotensive and altered, ultimately transferred to ICU for peripheral pressors  WBC 13 hgb 6.8 plt 38 Cortisol 18.4, LA 2.9   Chronically ill appearing elderly F Awakens and follows commands, Oriented x2  Pink mm NCAT  Shallow respirations Tachycardic, regular + murmur Thin abd No obvious acute joint deformity   Acute Encephalopathy Hypoxia  Hypotension  -sepsis/UTI, medication related, ?volume status  Lactic acidosis  CML Hx pancytopenia w AoC anemia, AoC thrombocytopenia  Leukocytosis (acute)  Hyponatremia  Hyperkalemia, mild  CKD IIIa Hx HTN Hx diastolic HF P -awaiting 1 PRBC -wean periph pressors for MAP 65 -- making steady progress with this. If unable will consider  -minimize CNS depressing meds  -follow LA  -follow cx data  -will get a cxr -- if c/f PNA would consider changing to unasyn from rocephin  -IS -wean O2 as able    If able to wean off pressors txf our of ICU   Tessie Fass MSN, AGACNP-BC [facility] Pulmonary/Critical Care Medicine Amion for pager  12/05/2023, 10:21 AM

## 2023-12-05 NOTE — Progress Notes (Signed)
Patient Blood pressure is 80 / 50 notified Dr. Janalyn Shy with order and carried out.    Mews red notified Charge nurse.   Vital signs taken every hours.   Reported to Dr. Janalyn Shy vital signs every hour.   0400 Lab reported about HBG and reported to Dr. Janalyn Shy.  Patient is referred to ICU MD by Dr. Janalyn Shy.   1610 Seen by ICU MD and change of level of care ordered and transfer order given.   [id number] Informed rapid response Nurse for assistance.   5409 Report to ICU nurse Given   0500H Transferred to ICU via patient's bed accompanied by RAPID response Nurse   0530 Notified Family Members of the patient's transfer to the ICU

## 2023-12-06 DIAGNOSIS — Z515 Encounter for palliative care: Secondary | ICD-10-CM

## 2023-12-06 DIAGNOSIS — N1831 Chronic kidney disease, stage 3a: Secondary | ICD-10-CM | POA: Diagnosis not present

## 2023-12-06 DIAGNOSIS — R531 Weakness: Secondary | ICD-10-CM

## 2023-12-06 DIAGNOSIS — I5032 Chronic diastolic (congestive) heart failure: Secondary | ICD-10-CM | POA: Diagnosis not present

## 2023-12-06 DIAGNOSIS — G9341 Metabolic encephalopathy: Secondary | ICD-10-CM | POA: Diagnosis not present

## 2023-12-06 DIAGNOSIS — G9340 Encephalopathy, unspecified: Secondary | ICD-10-CM

## 2023-12-06 DIAGNOSIS — J9611 Chronic respiratory failure with hypoxia: Secondary | ICD-10-CM | POA: Diagnosis not present

## 2023-12-06 DIAGNOSIS — J449 Chronic obstructive pulmonary disease, unspecified: Secondary | ICD-10-CM | POA: Diagnosis not present

## 2023-12-06 LAB — URINE CULTURE: Culture: 60000 — AB

## 2023-12-06 LAB — CBC
HCT: 28.1 % — ABNORMAL LOW (ref 36.0–46.0)
Hemoglobin: 8.5 g/dL — ABNORMAL LOW (ref 12.0–15.0)
MCH: 29.7 pg (ref 26.0–34.0)
MCHC: 30.2 g/dL (ref 30.0–36.0)
MCV: 98.3 fL (ref 80.0–100.0)
Platelets: 34 10*3/uL — ABNORMAL LOW (ref 150–400)
RBC: 2.86 MIL/uL — ABNORMAL LOW (ref 3.87–5.11)
RDW: 20.8 % — ABNORMAL HIGH (ref 11.5–15.5)
WBC: 10.2 10*3/uL (ref 4.0–10.5)
nRBC: 11.3 % — ABNORMAL HIGH (ref 0.0–0.2)

## 2023-12-06 LAB — BASIC METABOLIC PANEL
Anion gap: 10 (ref 5–15)
Anion gap: 11 (ref 5–15)
BUN: 23 mg/dL (ref 8–23)
BUN: 20 mg/dL (ref 8–23)
CO2: 19 mmol/L — ABNORMAL LOW (ref 22–32)
CO2: 22 mmol/L (ref 22–32)
Calcium: 8.6 mg/dL — ABNORMAL LOW (ref 8.9–10.3)
Calcium: 8.3 mg/dL — ABNORMAL LOW (ref 8.9–10.3)
Chloride: 99 mmol/L (ref 98–111)
Chloride: 95 mmol/L — ABNORMAL LOW (ref 98–111)
Creatinine, Ser: 0.96 mg/dL (ref 0.44–1.00)
Creatinine, Ser: 1.03 mg/dL — ABNORMAL HIGH (ref 0.44–1.00)
GFR, Estimated: 57 mL/min — ABNORMAL LOW (ref >60)
GFR, Estimated: 53 mL/min — ABNORMAL LOW (ref >60)
Glucose, Bld: 95 mg/dL (ref 70–99)
Glucose, Bld: 117 mg/dL — ABNORMAL HIGH (ref 70–99)
Potassium: 4.1 mmol/L (ref 3.5–5.1)
Potassium: 3.7 mmol/L (ref 3.5–5.1)
Sodium: 128 mmol/L — ABNORMAL LOW (ref 135–145)
Sodium: 128 mmol/L — ABNORMAL LOW (ref 135–145)

## 2023-12-06 MED ORDER — SODIUM CHLORIDE 1 G PO TABS
1.0000 g | ORAL_TABLET | Freq: Once | ORAL | Status: AC
Start: 2023-12-06 — End: 2023-12-06
  Administered 2023-12-06: 1 g via ORAL
  Filled 2023-12-06: qty 1

## 2023-12-06 MED ORDER — SODIUM CHLORIDE 0.9 % IV SOLN: INTRAVENOUS | Status: AC

## 2023-12-06 MED ORDER — SODIUM CHLORIDE 0.9 % IV SOLN: INTRAVENOUS | Status: DC

## 2023-12-06 NOTE — Progress Notes (Addendum)
NAME:  Lisa [name], MRN:  [id number], DOB:  1934-04-20, LOS: 2 ADMISSION DATE:  12/03/2023, CONSULTATION DATE: 12/05/2023 REFERRING MD: Dr. Janalyn Shy, CHIEF COMPLAINT: Hypotension, sepsis  History of Present Illness:  89 year old lady being treated for metabolic encephalopathy likely secondary to urinary tract infection with multiple comorbidities who is noted to be hypotensive, remains hypotensive despite ongoing interventions including fluid resuscitation, midodrine, albumin infusion.  Multiple underlying comorbidities including myelodysplastic syndrome, past history of adenocarcinoma of the left lung, invasive ductal carcinoma of the left breast which is being treated, stage III chronic kidney disease, paroxysmal atrial fibrillation, chronic diastolic heart failure, history of chronic myelomonocytic leukemia, chronic obstructive pulmonary disease, chronic respiratory failure  Notably was more alert, oriented on 2 /1 and feeling better than when she was admitted the previous day.  Goals of care discussions ongoing, palliative care has been consulted  Pertinent  Medical History   Past Medical History:  Diagnosis Date   AAA (abdominal aortic aneurysm) (HCC)    Adenocarcinoma of left lung (HCC) 2006   AF (paroxysmal atrial fibrillation) (HCC)    Aortic stenosis    Arthritis    Asthma    Cancer of breast, female (HCC)    Cancer of lung (HCC)    Dysrhythmia    Hypertension    Hypothyroidism    Invasive ductal carcinoma of left breast (HCC) 1999   Mitral regurgitation    Myocardial infarction (HCC)    Neutropenia (HCC) 06/09/2016   NSTEMI (non-ST elevated myocardial infarction) (HCC) 04/06/2023   Personal history of radiation therapy    Pulmonary hypertension (HCC)    TIA (transient ischemic attack) 02/10/2022     Significant Hospital Events: Including procedures, antibiotic start and stop dates in addition to other pertinent events   Admitted 12/04/2023 CT head 12/03/2023-no  acute abnormality Chest x-ray 12/03/2023 no acute infiltrate, cardiomegaly, small effusions PCCM consult for persistent hypotension 2/2. Quickly weaned off pressors and is awaiting transfer out of ICU 2/3 still awaiting transfer out of ICU. Dc remeron. SLP consult   Interim History / Subjective:  Eating breakfast   Objective   Blood pressure 118/70, pulse (!) 103, temperature 99.1 F (37.3 C), temperature source Axillary, resp. rate (!) 41, height 5\' 7"  (1.702 m), weight 64.1 kg, SpO2 (!) 88%.        Intake/Output Summary (Last 24 hours) at 12/06/2023 0804 Last data filed at 12/06/2023 0600 Gross per 24 hour  Intake 962.29 ml  Output 900 ml  Net 62.29 ml   Filed Weights   12/03/23 1537 12/05/23 0613 12/06/23 0515  Weight: 59.9 kg 59.6 kg 64.1 kg    Examination: General: pleasant frail elderly F NAD  HENT: NCAT pink mmm  Lungs: Symmetrical chest expansion, no wheezing  Cardiovascular: s1s2 + murmur  Abdomen: soft ndnt  Extremities: no acute joint deformity, no pitting edema. Decr muscle mass  Neuro: Awake alert oriented x3  GU: defer  Resolved Hospital Problem list     Assessment & Plan:   Acute metabolic encephalopathy superimposed on baseline dementia -think component of symptomatic hypoNa is likely, esp w consideration of prior hospitalizations for this  P -delirium precautions -I have changed the timing of some of her home psych meds  -no further BZD.  -Na as below  UTI -rocephin   COPD, severe obstructive dz Chronic hypoxic resp failure  -cxr this admission isnt overtly cw pna, does have hx aspiration pna however and some coughing after breakfast 2/3 (but sounds like an isolated incident  per RN) P -On Breo, Incruse -IS -SLP consult  CKD IIIa NAGMA  Hyponatremia, recurrent  Hyperkalemia, improved -hospitalized previously w symptomatic hyponatremia, mixed picture. Remeron was held and it was rec not to restart by nephrology -this admission, her Na is  improving w gentle volume, which previously was not the case. Previously, poor solute + hypervolemia were both felt contributors, ?SIADH component as well  P -dc remeron -she is taking POs, encourage. Will give 1g salt tab x1 -Defer add'l IVF for now -- she seems about euvolemic now & as above, taking POs  -follow renal indices  uop -will check BMP in afternoon and again in AM   Hx pancytopenia, CMML - MDS  AoC anemia AoC thrombocytopenia  -rcvd 1 PRBC 2/2 for hgb 6.8 w appropriate response  P -AM CBC   Hx diastolic HF Hx HTN  Moderate AS  Hx NSTEMI P -holding antihypertensives w recent hypotension  -statin   History of breast cancer s/p LUL lumpectomy  -S/p lumpectomy, remains on tamoxifen  Hypothyroidism -On Synthroid    Best Practice (right click and "Reselect all SmartList Selections" daily)   Diet/type: Regular consistency (see orders) DVT prophylaxis other, scd Pressure ulcer(s): N/A GI prophylaxis: N/A Lines: N/A Foley:  N/A Code Status:  full code Last date of multidisciplinary goals of care discussion [grandson updated 2/2]  Labs   CBC: Recent Labs  Lab 12/03/23 1325 12/03/23 1331 12/03/23 1757 12/04/23 0757 12/05/23 0325 12/05/23 1428 12/06/23 0323  WBC 9.4  --   --  12.2* 13.1*  --  10.2  HGB 8.2*   < > 8.8* 7.3* 6.8* 7.9* 8.5*  HCT 28.2*   < > 26.0* 24.6* 23.0* 25.2* 28.1*  MCV 102.9*  --   --  99.2 100.9*  --  98.3  PLT 41*  --   --  39* 38*  --  34*   < > = values in this interval not displayed.    Basic Metabolic Panel: Recent Labs  Lab 12/03/23 2046 12/04/23 0757 12/04/23 0758 12/04/23 1623 12/05/23 0321 12/05/23 0323 12/06/23 0323  NA 128* 129*  --   --  125* 125* 128*  K 5.5* 5.5*  --  4.6 5.2* 5.3* 4.1  CL 95* 96*  --   --  98 99 99  CO2 21* 24  --   --  19* 18* 19*  GLUCOSE 97 90  --   --  126* 124* 95  BUN 33* 27*  --   --  25* 25* 23  CREATININE 1.63* 1.38*  --   --  1.27* 1.27* 0.96  CALCIUM 9.7 9.1  --   --  8.3*  8.3* 8.6*  MG  --   --  1.6*  --  2.0  --   --   PHOS  --   --   --   --   --  4.0  --    GFR: Estimated Creatinine Clearance: 40.1 mL/min (by C-G formula based on SCr of 0.96 mg/dL). Recent Labs  Lab 12/03/23 1325 12/03/23 1332 12/04/23 0757 12/05/23 0325 12/05/23 0853 12/05/23 1236 12/06/23 0323  WBC 9.4  --  12.2* 13.1*  --   --  10.2  LATICACIDVEN  --  0.9  --   --  2.9* 1.3  --     Liver Function Tests: Recent Labs  Lab 12/03/23 1325 12/03/23 2046 12/04/23 0757 12/05/23 0323  AST 37 39 32  --   ALT 12 11 10   --  ALKPHOS 42 39 34*  --   BILITOT 1.7* 1.2 1.3*  --   PROT 9.4* 9.3* 8.0  --   ALBUMIN 3.2* 3.1* 2.7* 2.7*   No results for input(s): "LIPASE", "AMYLASE" in the last 168 hours. No results for input(s): "AMMONIA" in the last 168 hours.  ABG    Component Value Date/Time   PHART 7.26 (L) 08/04/2023 1112   PCO2ART 56 (H) 08/04/2023 1112   PO2ART 31 (LL) 08/04/2023 1112   HCO3 23.5 12/03/2023 1757   TCO2 25 12/03/2023 1757   ACIDBASEDEF 3.0 (H) 12/03/2023 1757   O2SAT 53 12/03/2023 1757     Coagulation Profile: No results for input(s): "INR", "PROTIME" in the last 168 hours.  Cardiac Enzymes: No results for input(s): "CKTOTAL", "CKMB", "CKMBINDEX", "TROPONINI" in the last 168 hours.  HbA1C: Hgb A1c MFr Bld  Date/Time Value Ref Range Status  02/11/2022 05:05 AM 4.4 (L) 4.8 - 5.6 % Final    Comment:    (NOTE) Pre diabetes:          5.7%-6.4%  Diabetes:              >6.4%  Glycemic control for   <7.0% adults with diabetes     CBG: Recent Labs  Lab 12/03/23 1129 12/05/23 0620  GLUCAP 91 124*    Mod MDM  Tessie Fass MSN, AGACNP-BC [facility] Pulmonary/Critical Care Medicine Amion for pager  12/06/2023, 8:04 AM

## 2023-12-06 NOTE — Evaluation (Signed)
Clinical/Bedside Swallow Evaluation Patient Details  Name: Lisa [name] MRN: [id number] Date of Birth: 1934/01/07  Today's Date: 12/06/2023 Time: SLP Start Time (ACUTE ONLY): 0847 SLP Stop Time (ACUTE ONLY): 0902 SLP Time Calculation (min) (ACUTE ONLY): 15 min  Past Medical History:  Past Medical History:  Diagnosis Date   AAA (abdominal aortic aneurysm) (HCC)    Adenocarcinoma of left lung (HCC) 2006   AF (paroxysmal atrial fibrillation) (HCC)    Aortic stenosis    Arthritis    Asthma    Cancer of breast, female (HCC)    Cancer of lung (HCC)    Dysrhythmia    Hypertension    Hypothyroidism    Invasive ductal carcinoma of left breast (HCC) 1999   Mitral regurgitation    Myocardial infarction (HCC)    Neutropenia (HCC) 06/09/2016   NSTEMI (non-ST elevated myocardial infarction) (HCC) 04/06/2023   Personal history of radiation therapy    Pulmonary hypertension (HCC)    TIA (transient ischemic attack) 02/10/2022   Past Surgical History:  Past Surgical History:  Procedure Laterality Date   BIOPSY  10/23/2020   Procedure: BIOPSY;  Surgeon: Benancio Deeds, MD;  Location: [facility] ENDOSCOPY;  Service: Gastroenterology;;   BREAST BIOPSY     left axillary node dissection   BREAST LUMPECTOMY Left    COLONOSCOPY  03/08/2003   WUJ:WJXBJYNWGN rectal polyps destroyed with the tip of the snare/Polyps at hepatic flexure, splenic flexure at 35 cm/Left-sided diverticula: unable to retrieve path   COLONOSCOPY  09/12/2008   FAO:ZHYQMV rectum and distal sigmoid diminutive polyps/scattered left sided diverticulum. hyperplastic   COLONOSCOPY N/A 03/06/2013   HQI:ONGEXBM polyp-removed as described above; colonic diverticulosis. hyperplastic polyps. next TCS 03/2018   ESOPHAGOGASTRODUODENOSCOPY (EGD) WITH PROPOFOL N/A 10/23/2020   Procedure: ESOPHAGOGASTRODUODENOSCOPY (EGD) WITH PROPOFOL;  Surgeon: Benancio Deeds, MD;  Location: [facility] ENDOSCOPY;  Service: Gastroenterology;  Laterality: N/A;    FOOT SURGERY     LUNG REMOVAL, PARTIAL     upper lobe   HPI:  89 y.o. female presented to ED from home on 12/03/23 with AMS. Dx metabolic encephalopathy likely secondary to UTI, hypotension.  PMHx A-fib not currently anticoagulated, NSTEMI, COPD, chronic resp failure, adenocarcinoma left lung, pulmonary hypertension, hypothyroidism, hypertension, CHF, AAA. Clinical swallow evaluation 08/07/23 revealed a transient dysphagia related to AMS; once MS cleared, swallow returned to baseline function.    Assessment / Plan / Recommendation  Clinical Impression  Pt was lethargic but rousable for swallowing assessment. Oral mechanism exam was normal; no focal deficits. Edentulous. (Her daughter, via phone, reports that she hasn't worn dentures in ~five years and doesn't need them for chewing.) She was oriented to person and year, but not month/date nor location [facility]).  She demonstrated sufficient attention in order to drink water from a straw and accepted applesauce without any noted swallowing difficulties. There were no s/s of aspiration over the course of multiple swallows. Recommend mechanical soft diet; thin liquids. Give meds in applesauce if she coughs with pills in water.  Hold solids when she is not alert.  SLP will follow briefly. SLP Visit Diagnosis: Dysphagia, unspecified (R13.10)    Aspiration Risk       Diet Recommendation   Dysphagia 3 (mechanical soft);Thin  Medication Administration: Whole meds with liquid    Other  Recommendations Oral Care Recommendations: Oral care BID    Recommendations for follow up therapy are one component of a multi-disciplinary discharge planning process, led by the attending physician.  Recommendations may be updated  based on patient status, additional functional criteria and insurance authorization.  Follow up Recommendations No SLP follow up          Functional Status Assessment Patient has had a recent decline in their functional status and  demonstrates the ability to make significant improvements in function in a reasonable and predictable amount of time.  Frequency and Duration min 1 x/week  1 week       Prognosis Prognosis for improved oropharyngeal function: Good      Swallow Study   General Date of Onset: 12/03/23 HPI: 89 y.o. female presented to ED from home on 12/03/23 with AMS. Dx metabolic encephalopathy likely secondary to UTI, hypotension.  PMHx A-fib not currently anticoagulated, NSTEMI, COPD, chronic resp failure, adenocarcinoma left lung, pulmonary hypertension, hypothyroidism, hypertension, CHF, AAA. Clinical swallow evaluation 08/07/23 revealed a transient dysphagia related to AMS; once MS cleared, swallow returned to baseline function. Type of Study: Bedside Swallow Evaluation Previous Swallow Assessment: Oct 2024 Diet Prior to this Study: Thin liquids (Level 0);Dysphagia 2 (finely chopped) Temperature Spikes Noted: No Respiratory Status: Nasal cannula History of Recent Intubation: No Behavior/Cognition: Lethargic/Drowsy Oral Cavity Assessment: Within Functional Limits Oral Care Completed by SLP: No Oral Cavity - Dentition: Edentulous Self-Feeding Abilities: Needs assist Patient Positioning: Upright in bed Baseline Vocal Quality: Normal Volitional Cough: Strong Volitional Swallow: Able to elicit    Oral/Motor/Sensory Function Overall Oral Motor/Sensory Function: Within functional limits   Ice Chips Ice chips: Within functional limits   Thin Liquid Thin Liquid: Within functional limits    Nectar Thick Nectar Thick Liquid: Not tested   Honey Thick Honey Thick Liquid: Not tested   Puree Puree: Within functional limits   Solid     Solid: Not tested      Blenda Mounts Laurice 12/06/2023,9:11 AM  Marchelle Folks L. Samson Frederic, MA CCC/SLP Clinical Specialist - Acute Care SLP Acute Rehabilitation Services Office number [phone]

## 2023-12-06 NOTE — Consult Note (Signed)
Consultation Note Date: 12/06/2023   Patient Name: Lisa [name]  DOB: [name] 09, 1934  MRN: [id number]  Age / Sex: 89 y.o., female  PCP: [name], MD Referring Physician: Cheri Fowler, MD  Reason for Consultation: Establishing goals of care  HPI/Patient Profile: 89 y.o. female   admitted on 12/03/2023 with  metabolic encephalopathy likely secondary to urinary tract infection with multiple comorbidities   Admitted for treatment and stabilization; interventions including fluid resuscitation, midodrine and albumin infusion for hypotension   Multiple underlying comorbidities including myelodysplastic syndrome, past history of adenocarcinoma of the left lung, invasive ductal carcinoma of the left breast which is being treated, stage III chronic kidney disease, paroxysmal atrial fibrillation, chronic diastolic heart failure, history of chronic myelomonocytic leukemia, chronic obstructive pulmonary disease, chronic respiratory failure  This is patient's fourth admission in the last 6 months.  Patient and family face treatment option decisions, advanced directive decisions and anticipatory care needs.    Clinical Assessment and Goals of Care:  This NP Lorinda Creed reviewed medical records, received report from team, assessed the patient and then meet at the patient's bedside  to discuss diagnosis, prognosis, GOC, EOL wishes disposition and options.  No family at bedside.  Patient is oriented to person and place, little  insight into reason for admission.   Concept of Palliative Care was introduced as specialized medical care for people and their families living with serious illness.  If focuses on providing relief from the symptoms and stress of a serious illness.  The goal is to improve quality of life for both the patient and the family.  Patient was seen multiple times by palliative medicine team on recent  admissions.  Patient is a poor historian at this point in time.  According to EMR patient has had overall continued physical and functional decline over the past several months.  An attempt to include family in conversation regarding goals of care, I left a message for patient's H HPOA / Nakoma Gotwalt, await callback.  I will also contact other family members listed in emergency contacts.      Questions and concerns addressed.     PMT will continue to support holistically.     SUMMARY OF RECOMMENDATIONS    Code Status/Advance Care Planning: Full code Recommendation is for patient/family to consider DNR/DNI status understanding evidenced based poor outcomes in similar hospitalized patient, as the cause of arrest is likely associated with advanced chronic illness rather than an easily reversible acute cardio-pulmonary event.    Palliative Prophylaxis:  Aspiration, Bowel Regimen, Delirium Protocol, and Frequent Pain Assessment  Additional Recommendations (Limitations, Scope, Preferences): Full Scope Treatment   Prognosis:  Unable to determine  Discharge Planning: To Be Determined      Primary Diagnoses: Present on Admission:  Invasive ductal carcinoma of left breast (HCC)  Adenocarcinoma of left lung (HCC)  HTN (hypertension)  Hypothyroidism  Paroxysmal atrial fibrillation (HCC)  Chronic diastolic CHF (congestive heart failure) (HCC) - LVEF 60-65% by echo 06-2955  CKD stage 3a, GFR 45-59 ml/min (  HCC)  Deficiency anemia  Gout  Myelodysplastic syndrome (HCC)  CAD (coronary artery disease)  COPD (chronic obstructive pulmonary disease) (HCC)  CMML (chronic myelomonocytic leukemia) (HCC)  Chronic respiratory failure with hypoxia (HCC)  Acute encephalopathy   I have reviewed the medical record, interviewed the patient and family, and examined the patient. The following aspects are pertinent.  Past Medical History:  Diagnosis Date   AAA (abdominal aortic aneurysm)  (HCC)    Adenocarcinoma of left lung (HCC) 2006   AF (paroxysmal atrial fibrillation) (HCC)    Aortic stenosis    Arthritis    Asthma    Cancer of breast, female (HCC)    Cancer of lung (HCC)    Dysrhythmia    Hypertension    Hypothyroidism    Invasive ductal carcinoma of left breast (HCC) 1999   Mitral regurgitation    Myocardial infarction (HCC)    Neutropenia (HCC) 06/09/2016   NSTEMI (non-ST elevated myocardial infarction) (HCC) 04/06/2023   Personal history of radiation therapy    Pulmonary hypertension (HCC)    TIA (transient ischemic attack) 02/10/2022   Social History   Socioeconomic History   Marital status: Widowed    Spouse name: Not on file   Number of children: Not on file   Years of education: Not on file   Highest education level: Not on file  Occupational History   Not on file  Tobacco Use   Smoking status: Former    Current packs/day: 0.00    Average packs/day: 0.8 packs/day for 35.0 years (26.3 ttl pk-yrs)    Types: Cigarettes    Start date: 25    Quit date: 38    Years since quitting: 32.1   Smokeless tobacco: Never   Tobacco comments:    smoke-free X 30 yeras  Vaping Use   Vaping status: Never Used  Substance and Sexual Activity   Alcohol use: No   Drug use: No   Sexual activity: Not Currently  Other Topics Concern   Not on file  Social History Narrative   Not on file   Social Drivers of Health   Financial Resource Strain: Not on file  Food Insecurity: No Food Insecurity (12/04/2023)   Hunger Vital Sign    Worried About Running Out of Food in the Last Year: Never true    Ran Out of Food in the Last Year: Never true  Transportation Needs: No Transportation Needs (12/04/2023)   PRAPARE - Administrator, Civil Service (Medical): No    Lack of Transportation (Non-Medical): No  Physical Activity: Inactive (12/12/2020)   Exercise Vital Sign    Days of Exercise per Week: 0 days    Minutes of Exercise per Session: 0 min  Stress:  No Stress Concern Present (08/27/2023)   Received from [facility] of Occupational Health - Occupational Stress Questionnaire    Feeling of Stress : Not at all  Social Connections: Unknown (12/04/2023)   Social Connection and Isolation Panel [NHANES]    Frequency of Communication with Friends and Family: More than three times a week    Frequency of Social Gatherings with Friends and Family: More than three times a week    Attends Religious Services: Not on file    Active Member of Clubs or Organizations: Not on file    Attends Banker Meetings: Not on file    Marital Status: Not on file   Family History  Problem Relation Age of Onset  Cancer Mother    Cancer Sister    Colon cancer Neg Hx    Scheduled Meds:  sodium chloride   Intravenous Once   Chlorhexidine Gluconate Cloth  6 each Topical Daily   estradiol  1 Applicatorful Vaginal QHS   fluticasone furoate-vilanterol  1 puff Inhalation Daily   And   umeclidinium bromide  1 puff Inhalation Daily   OLANZapine  5 mg Oral QHS   pantoprazole  40 mg Oral Q0600   pravastatin  40 mg Oral QPM   sodium chloride flush  3 mL Intravenous Q12H   traZODone  100 mg Oral QHS   Continuous Infusions:  cefTRIAXone (ROCEPHIN)  IV Stopped (12/05/23 1821)   PRN Meds:.acetaminophen, QUEtiapine Medications Prior to Admission:  Prior to Admission medications   Medication Sig Start Date End Date Taking? Authorizing Provider  acetaminophen (TYLENOL) 325 MG tablet Take 1.5-3 tablets (487.5-975 mg total) by mouth every 6 (six) hours as needed for mild pain (or Fever >/= 101). 07/15/23  Yes Johnson, Clanford L, MD  budesonide-formoterol (SYMBICORT) 80-4.5 MCG/ACT inhaler Inhale 2 puffs into the lungs 2 (two) times daily.   Yes [provider]  estradiol (ESTRACE) 0.1 MG/GM vaginal cream Discard plastic applicator. Insert a blueberry size amount (approximately 1 gram) of cream on fingertip inside vagina at bedtime  every night for 1 week then every other night. For long term use. 12/01/23  Yes Deliah Boston, Eleonore Chiquito, FNP  furosemide (LASIX) 20 MG tablet Take 1 tablet (20 mg total) by mouth 2 (two) times daily. 10/22/23  Yes Mallipeddi, Vishnu P, MD  levothyroxine (SYNTHROID) 100 MCG tablet Take 1 tablet (100 mcg total) by mouth daily before breakfast. 08/28/21  Yes Johnson, Clanford L, MD  Magnesium 400 MG CAPS as directed Orally   Yes [provider]  metoprolol succinate (TOPROL-XL) 25 MG 24 hr tablet TAKE 1 TABLET DAILY 02/18/23  Yes Mallipeddi, Vishnu P, MD  mirtazapine (REMERON) 30 MG tablet Take 30 mg by mouth at bedtime.   Yes [provider]  Multiple Vitamin (MULTI-VITAMIN) tablet Take 1 tablet by mouth daily.   Yes [provider]  OLANZapine (ZYPREXA) 5 MG tablet Take 5 mg by mouth daily. 10/14/23  Yes [provider]  pantoprazole (PROTONIX) 40 MG tablet Take 40 mg by mouth daily.   Yes [provider]  pravastatin (PRAVACHOL) 40 MG tablet Take 1 tablet (40 mg total) by mouth every evening. 07/15/23  Yes Johnson, Clanford L, MD  QUEtiapine (SEROQUEL) 25 MG tablet Take 25 mg by mouth at bedtime as needed. 10/19/23  Yes [provider]  traZODone (DESYREL) 100 MG tablet Take 100 mg by mouth at bedtime. 10/14/23  Yes [provider]  Vibegron (GEMTESA) 75 MG TABS Take 1 tablet (75 mg total) by mouth daily. 12/01/23  Yes Donnita Falls, FNP  Fluticasone-Umeclidin-Vilant (TRELEGY ELLIPTA) 100-62.5-25 MCG/ACT AEPB Inhale 1 puff into the lungs daily. 12/18/22   Oretha Milch, MD   Allergies  Allergen Reactions   Meloxicam Other (See Comments)    Caused an injury to the kidneys, per nephrologist   Micardis Hct [Telmisartan-Hctz] Other (See Comments)    Caused an injury to the kidneys, per nephrologist   Review of Systems  Unable to perform ROS: Mental status change    Physical Exam Cardiovascular:     Rate and Rhythm: Normal rate.   Pulmonary:     Effort: Pulmonary effort is normal.  Musculoskeletal:     Comments: Generalized weakness and  muscle atrophy   Skin:    General: Skin is warm and dry.  Neurological:     Mental Status: She is alert.     Vital Signs: BP (!) 102/56   Pulse 99   Temp 99.4 F (37.4 C) (Oral)   Resp (!) 36   Ht 5\' 7"  (1.702 m)   Wt 64.1 kg   SpO2 94%   BMI 22.13 kg/m  Pain Scale: 0-10   Pain Score: 0-No pain   SpO2: SpO2: 94 % O2 Device:SpO2: 94 % O2 Flow Rate: .O2 Flow Rate (L/min): 2 L/min  IO: Intake/output summary:  Intake/Output Summary (Last 24 hours) at 12/06/2023 1446 Last data filed at 12/06/2023 0600 Gross per 24 hour  Intake 511.01 ml  Output 750 ml  Net -238.99 ml    LBM: Last BM Date :  (PTA) Baseline Weight: Weight: 59.9 kg Most recent weight: Weight: 64.1 kg     Palliative Assessment/Data: 40% at best     Time 65 minutes  Signed by: Lorinda Creed, NP   Please contact Palliative Medicine Team phone at [phone] for questions and concerns.  For individual provider: See Loretha Stapler

## 2023-12-06 NOTE — Plan of Care (Signed)

## 2023-12-07 DIAGNOSIS — E43 Unspecified severe protein-calorie malnutrition: Secondary | ICD-10-CM | POA: Insufficient documentation

## 2023-12-07 DIAGNOSIS — D469 Myelodysplastic syndrome, unspecified: Secondary | ICD-10-CM | POA: Diagnosis not present

## 2023-12-07 DIAGNOSIS — G9340 Encephalopathy, unspecified: Secondary | ICD-10-CM | POA: Diagnosis not present

## 2023-12-07 DIAGNOSIS — C3492 Malignant neoplasm of unspecified part of left bronchus or lung: Secondary | ICD-10-CM | POA: Diagnosis not present

## 2023-12-07 DIAGNOSIS — N1831 Chronic kidney disease, stage 3a: Secondary | ICD-10-CM | POA: Diagnosis not present

## 2023-12-07 LAB — CBC WITH DIFFERENTIAL/PLATELET
Abs Immature Granulocytes: 0.38 10*3/uL — ABNORMAL HIGH (ref 0.00–0.07)
Basophils Absolute: 0.1 10*3/uL (ref 0.0–0.1)
Basophils Relative: 0 %
Eosinophils Absolute: 0 10*3/uL (ref 0.0–0.5)
Eosinophils Relative: 0 %
HCT: 25.5 % — ABNORMAL LOW (ref 36.0–46.0)
Hemoglobin: 7.6 g/dL — ABNORMAL LOW (ref 12.0–15.0)
Immature Granulocytes: 2 %
Lymphocytes Relative: 8 %
Lymphs Abs: 1.8 10*3/uL (ref 0.7–4.0)
MCH: 29.5 pg (ref 26.0–34.0)
MCHC: 29.8 g/dL — ABNORMAL LOW (ref 30.0–36.0)
MCV: 98.8 fL (ref 80.0–100.0)
Monocytes Absolute: 18.6 10*3/uL — ABNORMAL HIGH (ref 0.1–1.0)
Monocytes Relative: 88 %
Neutro Abs: 0.4 10*3/uL — CL (ref 1.7–7.7)
Neutrophils Relative %: 2 %
Platelets: 45 10*3/uL — ABNORMAL LOW (ref 150–400)
RBC: 2.58 MIL/uL — ABNORMAL LOW (ref 3.87–5.11)
RDW: 20 % — ABNORMAL HIGH (ref 11.5–15.5)
WBC: 21.1 10*3/uL — ABNORMAL HIGH (ref 4.0–10.5)
nRBC: 6.1 % — ABNORMAL HIGH (ref 0.0–0.2)

## 2023-12-07 LAB — COMPREHENSIVE METABOLIC PANEL
ALT: 20 U/L (ref 0–44)
AST: 82 U/L — ABNORMAL HIGH (ref 15–41)
Albumin: 2.9 g/dL — ABNORMAL LOW (ref 3.5–5.0)
Alkaline Phosphatase: 38 U/L (ref 38–126)
Anion gap: 12 (ref 5–15)
BUN: 24 mg/dL — ABNORMAL HIGH (ref 8–23)
CO2: 18 mmol/L — ABNORMAL LOW (ref 22–32)
Calcium: 8.3 mg/dL — ABNORMAL LOW (ref 8.9–10.3)
Chloride: 98 mmol/L (ref 98–111)
Creatinine, Ser: 1.59 mg/dL — ABNORMAL HIGH (ref 0.44–1.00)
GFR, Estimated: 31 mL/min — ABNORMAL LOW (ref >60)
Glucose, Bld: 99 mg/dL (ref 70–99)
Potassium: 4.2 mmol/L (ref 3.5–5.1)
Sodium: 128 mmol/L — ABNORMAL LOW (ref 135–145)
Total Bilirubin: 1.1 mg/dL (ref 0.0–1.2)
Total Protein: 8.3 g/dL — ABNORMAL HIGH (ref 6.5–8.1)

## 2023-12-07 LAB — PATHOLOGIST SMEAR REVIEW

## 2023-12-07 MED ORDER — SENNOSIDES-DOCUSATE SODIUM 8.6-50 MG PO TABS
2.0000 | ORAL_TABLET | Freq: Two times a day (BID) | ORAL | Status: AC
  Administered 2023-12-07 – 2023-12-08 (×2): 2 via ORAL
  Filled 2023-12-07 (×2): qty 2

## 2023-12-07 MED ORDER — SODIUM CHLORIDE 0.45 % IV SOLN
INTRAVENOUS | Status: AC
  Filled 2023-12-07 (×2): qty 75

## 2023-12-07 MED ORDER — POLYETHYLENE GLYCOL 3350 17 G PO PACK
17.0000 g | PACK | Freq: Two times a day (BID) | ORAL | Status: AC
  Administered 2023-12-07 – 2023-12-08 (×2): 17 g via ORAL
  Filled 2023-12-07 (×2): qty 1

## 2023-12-07 MED ORDER — SODIUM CHLORIDE 0.9 % IV SOLN
INTRAVENOUS | Status: DC
Start: 2023-12-07 — End: 2023-12-07

## 2023-12-07 MED ORDER — ONDANSETRON HCL 4 MG/2ML IJ SOLN
4.0000 mg | Freq: Four times a day (QID) | INTRAMUSCULAR | Status: DC | PRN
  Administered 2023-12-07 – 2023-12-10 (×2): 4 mg via INTRAVENOUS
  Filled 2023-12-07 (×2): qty 2

## 2023-12-07 MED ORDER — POLYETHYLENE GLYCOL 3350 17 G PO PACK: 17.0000 g | PACK | Freq: Two times a day (BID) | ORAL | Status: DC | PRN

## 2023-12-07 MED ORDER — SENNOSIDES-DOCUSATE SODIUM 8.6-50 MG PO TABS
1.0000 | ORAL_TABLET | Freq: Two times a day (BID) | ORAL | Status: DC | PRN
  Administered 2023-12-10: 1 via ORAL
  Filled 2023-12-07: qty 1

## 2023-12-07 MED ORDER — NEPRO/CARBSTEADY PO LIQD
237.0000 mL | Freq: Two times a day (BID) | ORAL | Status: DC
Start: 2023-12-08 — End: 2023-12-10
  Administered 2023-12-08 – 2023-12-10 (×6): 237 mL via ORAL

## 2023-12-07 MED ORDER — ADULT MULTIVITAMIN W/MINERALS CH
1.0000 | ORAL_TABLET | Freq: Every day | ORAL | Status: DC
Start: 2023-12-07 — End: 2023-12-09
  Administered 2023-12-07 – 2023-12-09 (×3): 1
  Filled 2023-12-07 (×3): qty 1

## 2023-12-07 NOTE — Evaluation (Signed)
 Physical Therapy Evaluation Patient Details Name: Lisa [name] MRN: [id number] DOB: 25-Oct-1934 Today's Date: 12/07/2023  History of Present Illness  89 y.o. female presents to [facility] hospital on 12/03/23 with AMS and UTI. 12/05/23 became hypotensive with ICU transfer;   PMH includes chronic HFpEF, HTN, CKD III, lung adenocarcinoma, breast cancer, hyperkalemia, anemia, gout, CML, moderate AS, AAA, NSTEMI, TIA, PAF.  Clinical Impression   Pt admitted secondary to problem above with deficits below. PTA patient was ambulating with RW (per chart and pt). She reports climbing 5-6 steps in/out of home with assistance. Pt currently requires +2 total assist to try to stand with pt unable to clear her buttocks from the bed. Attempted again to weightbear on LEs to scoot hips up toward [facility] and pt unable to clear buttocks. No family present, however anticipate pt will need time with inpatient therapies <3 hours/day prior to returning home.  Anticipate patient will benefit from PT to address problems listed below.Will continue to follow acutely to maximize functional mobility independence and safety.           If plan is discharge home, recommend the following: Two people to help with walking and/or transfers;Direct supervision/assist for medications management;Direct supervision/assist for financial management;Assist for transportation;Help with stairs or ramp for entrance;Supervision due to cognitive status   Can travel by private vehicle   No    Equipment Recommendations None recommended by PT  Recommendations for Other Services       Functional Status Assessment Patient has had a recent decline in their functional status and demonstrates the ability to make significant improvements in function in a reasonable and predictable amount of time.     Precautions / Restrictions Precautions Precautions: Fall;Other (comment) Precaution Comments: HOH (L ear seems better), dementia Restrictions Weight  Bearing Restrictions Per Provider Order: No      Mobility  Bed Mobility Overal bed mobility: Needs Assistance Bed Mobility: Supine to Sit, Sit to Supine     Supine to sit: Mod assist, +2 for physical assistance, HOB elevated Sit to supine: +2 for physical assistance, Mod assist   General bed mobility comments: multimodel cueing to initiate task. Pt required increased physical assist to bring legs off EOB and trunk off head of bed. Bed pad used also to pivot hips towards EOB. Assist with raising bil LEs up onto bed    Transfers Overall transfer level: Needs assistance Equipment used: Rolling walker (2 wheels) Transfers: Sit to/from Stand             General transfer comment: Attempted to complete sit to stand at EOB utilizing RW and 2 person assist. Pt unable to clear buttocks from bed and did not want to try again. 2 person face to face technique used to scoot hip towards [facility] with bed pad used to assist.    Ambulation/Gait               General Gait Details: unable  Stairs            Wheelchair Mobility     Tilt Bed    Modified Rankin (Stroke Patients Only)       Balance Overall balance assessment: History of Falls, Needs assistance Sitting-balance support: Feet supported, No upper extremity supported Sitting balance-Leahy Scale: Fair Sitting balance - Comments: sitting EOB Postural control: Left lateral lean  Pertinent Vitals/Pain Pain Assessment Pain Assessment: Faces Faces Pain Scale: No hurt    Home Living Family/patient expects to be discharged to:: Private residence Living Arrangements: Children Available Help at Discharge: Family;Available 24 hours/day Type of Home: House Home Access: Stairs to enter Entrance Stairs-Rails: Can reach both;Left;Right Entrance Stairs-Number of Steps: 5-6 steps   Home Layout: Two level;Laundry or work area in basement;Able to live on main level with  bedroom/bathroom Home Equipment: Agricultural Consultant (2 wheels);Cane - quad;BSC/3in1;Grab bars - tub/shower;Wheelchair - manual Additional Comments: Pt able to provide some background information and indormation also taken from therapy evaluation completed 4 months ago.    Prior Function Prior Level of Function : Needs assist       Physical Assist : Mobility (physical);ADLs (physical) Mobility (physical): Transfers;Gait;Bed mobility;Stairs ADLs (physical): IADLs;Dressing;Bathing Mobility Comments: Pt reports that she uses a RW to walk at home ADLs Comments: Pt reports that she receives assistance for everything from her family.     Extremity/Trunk Assessment   Upper Extremity Assessment Upper Extremity Assessment: Defer to OT evaluation    Lower Extremity Assessment Lower Extremity Assessment: Generalized weakness (unable to stand with +2 assist)    Cervical / Trunk Assessment Cervical / Trunk Assessment: Kyphotic  Communication   Communication Communication: Hearing impairment Cueing Techniques: Verbal cues;Visual cues  Cognition Arousal: Alert Behavior During Therapy: WFL for tasks assessed/performed Overall Cognitive Status: History of cognitive impairments - at baseline                                 General Comments: Oriented to self and place. Able to provide some home information.        General Comments General comments (skin integrity, edema, etc.): HR 110    Exercises     Assessment/Plan    PT Assessment Patient needs continued PT services  PT Problem List Decreased strength;Decreased activity tolerance;Decreased balance;Decreased mobility;Decreased cognition;Decreased knowledge of use of DME;Decreased safety awareness       PT Treatment Interventions DME instruction;Gait training;Stair training;Functional mobility training;Therapeutic activities;Therapeutic exercise;Balance training;Cognitive remediation;Patient/family education    PT Goals  (Current goals can be found in the Care Plan section)  Acute Rehab PT Goals Patient Stated Goal: unable PT Goal Formulation: Patient unable to participate in goal setting Time For Goal Achievement: 12/21/23 Potential to Achieve Goals: Fair    Frequency Min 1X/week     Co-evaluation PT/OT/SLP Co-Evaluation/Treatment: Yes Reason for Co-Treatment: To address functional/ADL transfers PT goals addressed during session: Mobility/safety with mobility OT goals addressed during session: ADL's and self-care;Strengthening/ROM       AM-PAC PT 6 Clicks Mobility  Outcome Measure Help needed turning from your back to your side while in a flat bed without using bedrails?: A Lot Help needed moving from lying on your back to sitting on the side of a flat bed without using bedrails?: Total Help needed moving to and from a bed to a chair (including a wheelchair)?: Total Help needed standing up from a chair using your arms (e.g., wheelchair or bedside chair)?: Total Help needed to walk in hospital room?: Total Help needed climbing 3-5 steps with a railing? : Total 6 Click Score: 7    End of Session Equipment Utilized During Treatment: Gait belt;Oxygen  Activity Tolerance: Patient limited by fatigue Patient left: in bed;with call bell/phone within reach;with bed alarm set   PT Visit Diagnosis: Muscle weakness (generalized) (M62.81);Difficulty in walking, not elsewhere classified (R26.2)  Time: 1450-1507 PT Time Calculation (min) (ACUTE ONLY): 17 min   Charges:   PT Evaluation $PT Eval Low Complexity: 1 Low   PT General Charges $$ ACUTE PT VISIT: 1 Visit          Macario RAMAN, PT Acute Rehabilitation Services  Office [phone]   Macario SHAUNNA Soja 12/07/2023, 3:54 PM

## 2023-12-07 NOTE — Care Management Important Message (Signed)
Important Message  Patient Details  Name: Lisa [name] MRN: [id number] Date of Birth: 10-27-1934   Important Message Given:  Yes - Medicare IM     Dorena Bodo 12/07/2023, 2:24 PM

## 2023-12-07 NOTE — Progress Notes (Signed)
 Initial Nutrition Assessment  DOCUMENTATION CODES:   Severe malnutrition in context of chronic illness  INTERVENTION:  Magic cup TID with meals, each supplement provides 290 kcal and 9 grams of protein Nepro Shake po BID, each supplement provides 425 kcal and 19 grams protein Continue dysphagia 3 diet per SLP recommendations MVI with minerals daily  NUTRITION DIAGNOSIS:   Severe Malnutrition related to chronic illness as evidenced by severe muscle depletion, severe fat depletion.  GOAL:   Patient will meet greater than or equal to 90% of their needs  MONITOR:   PO intake, Weight trends, Supplement acceptance  REASON FOR ASSESSMENT:   Malnutrition Screening Tool   ASSESSMENT:   Pt presenting with worsening mental status; admitted for acute encephalopathy. Pt with PMH of CKD 3a, gout, HTN, hypothyroidism, paroxysmal atrial fibrillation, hyperkalemia, anemia, diastolic CHF, breast & lung cancer, COPD, chronic respiratory failure with hypoxia, AAA.   Surgical hx: partial left lung removal, breast lumpectomy  1/20: Treated for UTI and followed up with urology on 1/29 2/3: SLP recommends dysphagia 3 diet and regular liquids  Pt drowsy upon entering room and did answer some questions but was easily distracted. Pt had trouble providing a good nutrition hx but said she enjoys blueberries, oatmeal, and sometimes eggs for breakfast. Pt does like ice cream so RD will add magic cup to trays. Pt has tried Nepro shakes during past admissions so RD will add these BID and monitor acceptance.   Pt reports last bowel movement was the day she was admitted (1/31) but per chart review there are no documented bowel movements for this admission. Discussed with MD.  Admission weight: 132 lbs Current weight: 141 lbs -Unsure of accuracy. Noted hx of CHF, fluid could be obscuring.   Meds: Protonix  40 mg, Pravachol  40 mg, Rocephin  IV  Labs: sodium low  NUTRITION - FOCUSED PHYSICAL  EXAM:  Flowsheet Row Most Recent Value  Orbital Region Severe depletion  Upper Arm Region Severe depletion  Thoracic and Lumbar Region Severe depletion  Buccal Region Severe depletion  Temple Region Severe depletion  Clavicle Bone Region Severe depletion  Clavicle and Acromion Bone Region Severe depletion  Scapular Bone Region Severe depletion  Dorsal Hand Severe depletion  Patellar Region Severe depletion  Anterior Thigh Region Severe depletion  Posterior Calf Region Moderate depletion  Edema (RD Assessment) None  Hair Reviewed  Eyes Reviewed  Mouth Reviewed  Skin Reviewed  Nails Reviewed       Diet Order:   Diet Order             DIET DYS 3 Room service appropriate? Yes with Assist; Fluid consistency: Thin  Diet effective now                   EDUCATION NEEDS:   Education needs have been addressed  Skin:  Skin Assessment: Reviewed RN Assessment  Last BM:     Height:   Ht Readings from Last 1 Encounters:  12/03/23 5' 7 (1.702 m)    Weight:   Wt Readings from Last 1 Encounters:  12/06/23 64.1 kg    Ideal Body Weight:  61.4 kg  BMI:  Body mass index is 22.13 kg/m.  Estimated Nutritional Needs:   Kcal:  1600-1800  Protein:  80-95 g  Fluid:  1.6-1.8 L    Tinnie Ferraris, MS Dietetic Intern

## 2023-12-07 NOTE — Evaluation (Signed)
 Occupational Therapy Evaluation Patient Details Name: Lisa [name] MRN: [id number] DOB: 1934-09-20 Today's Date: 12/07/2023   History of Present Illness 89 year old F with PMH of dementia, COPD, chronic hypoxic RF, MDS, pancytopenia, paroxysmal A-fib, diastolic CHF, AAA, chronic hyponatremia, left lung cancer, left breast cancer s/p lumpectomy, CKD-3A, recent UTI and moderate aortic stenosis brought to ED due to altered mental status, decreased appetite/p.o. intake.  Reportedly not wearing oxygen  for about a week.  She was admitted with working diagnosis of acute metabolic encephalopathy, AKI, kalemia, hyponatremia urinary tract infection.   Clinical Impression   Pt admitted with the above diagnosis. Pt currently with functional limitations due to the deficits listed below (see OT Problem List). Pt provided some background information regarding prior level of function stating that  Pt will benefit from acute skilled OT to increase their safety and independence with ADL and functional mobility for ADL to facilitate discharge. Patient will benefit from continued inpatient follow up therapy, <3 hours/day. OT will continue to follow pt acutely.          If plan is discharge home, recommend the following: Two people to help with walking and/or transfers;Two people to help with bathing/dressing/bathroom;Help with stairs or ramp for entrance;Assistance with cooking/housework;Assist for transportation    Functional Status Assessment  Patient has had a recent decline in their functional status and demonstrates the ability to make significant improvements in function in a reasonable and predictable amount of time.  Equipment Recommendations  Other (comment) (defer to next level of care)       Precautions / Restrictions Precautions Precautions: Fall;Other (comment) Precaution Comments: HOH (L ear seems better), dementia Restrictions Weight Bearing Restrictions Per Provider Order: No       Mobility Bed Mobility Overal bed mobility: Needs Assistance Bed Mobility: Supine to Sit, Sit to Supine     Supine to sit: Mod assist, +2 for physical assistance, HOB elevated Sit to supine: Min assist, +2 for physical assistance   General bed mobility comments: multimodel cueing to initiate task. Pt required increased physical assist to bring legs off EOB and to trunk off head of bed. Bed pad used also to pivot hips towards EOB.    Transfers Overall transfer level: Needs assistance Equipment used: Rolling walker (2 wheels) Transfers: Sit to/from Stand             General transfer comment: Attempted to complete sit to stand at EOB utilizing RW and 2 person assist. Pt unable to clear buttocks from bed and did not want to try again. 2 person face to face technique used to scoot hip towards [facility] with bed pad used to assist.      Balance Overall balance assessment: History of Falls, Needs assistance Sitting-balance support: Feet supported, No upper extremity supported Sitting balance-Leahy Scale: Fair Sitting balance - Comments: sitting EOB Postural control: Left lateral lean        ADL either performed or assessed with clinical judgement   ADL       Grooming: Minimal assistance;Bed level   Upper Body Bathing: Set up;Bed level   Lower Body Bathing: Maximal assistance;Bed level;Sitting/lateral leans   Upper Body Dressing : Minimal assistance;Sitting   Lower Body Dressing: Total assistance;Bed level   Toilet Transfer:  (unable to assess transfer this date)   Toileting- Architect and Hygiene: Total assistance;Bed level               Vision Baseline Vision/History:  (Not assessed)       Perception Perception:  Not tested       Praxis Praxis: Not tested       Pertinent Vitals/Pain Pain Assessment Pain Assessment: Faces Faces Pain Scale: No hurt     Extremity/Trunk Assessment Upper Extremity Assessment Upper Extremity Assessment: Right  hand dominant;Generalized weakness   Lower Extremity Assessment Lower Extremity Assessment: Defer to PT evaluation;Generalized weakness   Cervical / Trunk Assessment Cervical / Trunk Assessment: Kyphotic   Communication Communication Communication: Hearing impairment Cueing Techniques: Verbal cues;Visual cues   Cognition Arousal: Alert Behavior During Therapy: WFL for tasks assessed/performed Overall Cognitive Status: History of cognitive impairments - at baseline    General Comments: Oriented to self and place. Able to provide some home information.     General Comments  A-fib noted during session. HR elevated slightly during evaluation            Home Living Family/patient expects to be discharged to:: Private residence Living Arrangements: Children Available Help at Discharge: Family;Available 24 hours/day Type of Home: House Home Access: Stairs to enter [employer] of Steps: 5-6 steps Entrance Stairs-Rails: Can reach both;Left;Right Home Layout: Two level;Laundry or work area in basement;Able to live on main level with bedroom/bathroom     Bathroom Shower/Tub: Chief Strategy Officer: Handicapped height     Home Equipment: Agricultural Consultant (2 wheels);Cane - quad;BSC/3in1;Grab bars - tub/shower;Wheelchair - manual   Additional Comments: Pt able to provide some background information and indormation also taken from therapy evaluation completed 4 months ago.      Prior Functioning/Environment Prior Level of Function : Needs assist       Physical Assist : Mobility (physical);ADLs (physical) Mobility (physical): Transfers;Gait;Bed mobility;Stairs ADLs (physical): IADLs;Dressing;Bathing Mobility Comments: Pt reports that she uses a RW to walk at home ADLs Comments: Pt reports that she receives assistance for everything from her family.        OT Problem List: Decreased strength;Decreased activity tolerance;Impaired balance (sitting and/or  standing)      OT Treatment/Interventions: Self-care/ADL training;Therapeutic exercise;Therapeutic activities;Patient/family education;DME and/or AE instruction;Balance training    OT Goals(Current goals can be found in the care plan section) Acute Rehab OT Goals Patient Stated Goal: none stated OT Goal Formulation: Patient unable to participate in goal setting Time For Goal Achievement: 12/21/23 Potential to Achieve Goals: Good  OT Frequency: Min 1X/week    Co-evaluation PT/OT/SLP Co-Evaluation/Treatment: Yes Reason for Co-Treatment: To address functional/ADL transfers   OT goals addressed during session: ADL's and self-care;Strengthening/ROM      AM-PAC OT 6 Clicks Daily Activity     Outcome Measure Help from another person eating meals?: A Little Help from another person taking care of personal grooming?: A Little Help from another person toileting, which includes using toliet, bedpan, or urinal?: Total Help from another person bathing (including washing, rinsing, drying)?: A Lot Help from another person to put on and taking off regular upper body clothing?: A Little Help from another person to put on and taking off regular lower body clothing?: Total 6 Click Score: 13   End of Session Equipment Utilized During Treatment: Gait belt;Rolling walker (2 wheels);Oxygen   Activity Tolerance: Patient tolerated treatment well Patient left: in bed;with call bell/phone within reach;with bed alarm set  OT Visit Diagnosis: Unsteadiness on feet (R26.81);History of falling (Z91.81);Muscle weakness (generalized) (M62.81)                Time: 8551-8492 OT Time Calculation (min): 19 min Charges:  OT General Charges $OT Visit: 1 Visit OT Evaluation $OT Eval  Moderate Complexity: 1 Mod  [employer], OTR/L,CBIS  Supplemental OT - [facility] and [employer] Secure Chat Preferred    Isadora Delorey, Leita BIRCH 12/07/2023, 3:31 PM

## 2023-12-07 NOTE — Progress Notes (Signed)
 Speech Language Pathology Treatment: Dysphagia  Patient Details Name: Lisa [name] MRN: [id number] DOB: Oct 15, 1934 Today's Date: 12/07/2023 Time: 1021-1029 SLP Time Calculation (min) (ACUTE ONLY): 8 min  Assessment / Plan / Recommendation Clinical Impression  Pt is very alert this morning and engaging in self-feeding. She drank thin liquids with no overt difficulties. When given regular solids, pt demonstrated complete mastication but then expectorated the bolus into a napkin, stating that she did not like the taste. She did not consume other solids. Pt did show appropriate chewing, bolus formation, and control of solid trial even though she spit it back out. She seems capable of continuing with mechanical soft solids, which was her previously recommended diet. Will continue with this for now. Do not anticipate ongoing SLP needs if she is at her baseline diet, but if pt is not liking her mechanical soft diet (due to texture, not taste), nursing should feel free to soften to Dys 2 as needed.   HPI HPI: 89 y.o. female presented to ED from home on 12/03/23 with AMS. Dx metabolic encephalopathy likely secondary to UTI, hypotension.  PMHx A-fib not currently anticoagulated, NSTEMI, COPD, chronic resp failure, adenocarcinoma left lung, pulmonary hypertension, hypothyroidism, hypertension, CHF, AAA. Clinical swallow evaluation 08/07/23 revealed a transient dysphagia related to AMS; once MS cleared, swallow returned to baseline function.      SLP Plan  Continue with current plan of care      Recommendations for follow up therapy are one component of a multi-disciplinary discharge planning process, led by the attending physician.  Recommendations may be updated based on patient status, additional functional criteria and insurance authorization.    Recommendations  Diet recommendations: Dysphagia 3 (mechanical soft);Thin liquid Liquids provided via: Cup;Straw Medication Administration: Whole meds with  liquid Supervision: Staff to assist with self feeding Compensations: Minimize environmental distractions;Slow rate;Small sips/bites Postural Changes and/or Swallow Maneuvers: Seated upright 90 degrees                  Oral care BID     Dysphagia, unspecified (R13.10)     Continue with current plan of care     Leita SAILOR., M.A. CCC-SLP Acute Rehabilitation Services Office [phone]  Secure chat preferred   12/07/2023, 10:47 AM

## 2023-12-07 NOTE — Plan of Care (Signed)
   Problem: Education: Goal: Knowledge of General Education information will improve Description: Including pain rating scale, medication(s)/side effects and non-pharmacologic comfort measures Outcome: Progressing   Problem: Health Behavior/Discharge Planning: Goal: Ability to manage health-related needs will improve Outcome: Progressing   Problem: Clinical Measurements: Goal: Ability to maintain clinical measurements within normal limits will improve Outcome: Progressing Goal: Will remain free from infection Outcome: Progressing   Problem: Clinical Measurements: Goal: Will remain free from infection Outcome: Progressing

## 2023-12-07 NOTE — Progress Notes (Signed)
 PROGRESS NOTE  Lisa [name] FMW:[id number] DOB: 05/25/1934   PCP: Shona Norleen PEDLAR, MD  Patient is from: Home.  Lives with daughter.  DOA: 12/03/2023 LOS: 3  Chief complaints Chief Complaint  Patient presents with   Altered Mental Status     Brief Narrative / Interim history: 89 year old F with PMH of dementia, COPD, chronic hypoxic RF, MDS, pancytopenia, paroxysmal A-fib, diastolic CHF, AAA, chronic hyponatremia, left lung cancer, left breast cancer s/p lumpectomy, CKD-3A, recent UTI and moderate aortic stenosis brought to ED due to altered mental status, decreased appetite/p.o. intake.  Reportedly not wearing oxygen  for about a week.  She was admitted with working diagnosis of acute metabolic encephalopathy, AKI, kalemia, hyponatremia urinary tract infection.  Vitals stable.  K6.3.Cr 1.67 (baseline 1.0).  Protein 9.4.  Albumin  3.2.  NA 129 (baseline).  Hgb 8.2.  UA concerning for UTI.  VBG reassuring.  CT head without acute finding.  Cultures obtained.  Patient was started on IV ceftriaxone , IV fluid and electrolyte replenishment.  Patient had persistent hypotension concerning for septic shock.  He was transferred to ICU on 2/2 and started on vasopressors.  Blood cultures NGTD.  Urine culture with 60,000 colonies of Klebsiella pneumonia and 10,000 colonies of yeast.  Eventually, she came off vasopressor.  She was transferred to regular floor on 2/3, and TRH assumed care on 2/4.  Palliative medicine consulted.  Subjective: Seen and examined earlier this morning.  No major events overnight of this morning.  She is awake and alert and oriented x 4 except date and year.  She follows commands.  She thinks she is in the hospital because she fell.  She has a daughter and grandson living with her.  Objective: Vitals:   12/07/23 0124 12/07/23 0500 12/07/23 0807 12/07/23 0820  BP:  122/89 113/60   Pulse: (!) 47 90    Resp:  18 18   Temp:  98 F (36.7 C) 97.9 F (36.6 C)   TempSrc:  Oral Oral    SpO2: (!) 77% 99%  99%  Weight:      Height:        Examination:  GENERAL: No apparent distress.  Nontoxic. HEENT: MMM.  Vision and hearing grossly intact.  NECK: Supple.  No apparent JVD.  RESP:  No IWOB.  Fair aeration bilaterally. CVS:  RRR. Heart sounds normal.  ABD/GI/GU: BS+. Abd soft, NTND.  MSK/EXT:  Moves extremities. No apparent deformity. No edema.  SKIN: no apparent skin lesion or wound NEURO: Awake and alert.  Oriented x 4 except date and year.  Follows commands.  Limited insight.  Motor 2/5 in BLE.  Otherwise no focal neurodeficit.  PSYCH: Calm. Normal affect.   Procedures:  None  Microbiology summarized: Blood cultures NGTD Urine culture with 60,000 colonies Klebsiella pneumonia and 10,000 colonies of yeast  Assessment and plan: Acute metabolic encephalopathy: Multifactorial including underlying dementia, urinary tract infection, hyponatremia, dehydration and polypharmacy.  Her UA and urine culture concerning for UTI.  VBG is reassuring.  CT head without acute finding.  She is on Zyprexa , Seroquel , trazodone  and Remeron  which could put her at risk of polypharmacy.  She has no focal neurodeficit other than symmetric BLE weakness.  Encephalopathy seems to have resolved.  She is currently awake and alert and oriented x 4 except date and year. -Treat UTI as below -Agree with discontinuing Remeron  -Reorientation and delirium precautions. -Can consider decreasing trazodone  if mental status worse again -Avoid benzodiazepines -PT/OT   Urinary tract infection: Presents  with altered mental status, poor p.o. intake and dehydration.  UA consistent with UTI.  Urine culture as above. -Continue IV ceftriaxone  for a total of 5 days  AKI on CKD-3A: Likely due to dehydration in the setting of poor p.o. intake.  Baseline Cr appears to be 0.9.  Takes Lasix  20 mg twice daily at home.  Appears euvolemic on exam except for BLE edema. Recent Labs    08/15/23 0356 11/22/23 1418  12/03/23 1325 12/03/23 2046 12/04/23 0757 12/05/23 0321 12/05/23 0323 12/06/23 0323 12/06/23 1914 12/07/23 0602  BUN 15 15 32* 33* 27* 25* 25* 23 20 24*  CREATININE 0.87 1.32* 1.67* 1.63* 1.38* 1.27* 1.27* 0.96 1.03* 1.59*  -Sodium bicarbonate  infusion for 24 hours. -Strict intake and output -Recheck in the morning  Hyperkalemia/chronic hyponatremia: Hyperkalemia resolved. Na seems to be at baseline. -Continue monitoring -IV fluid as above.  Chronic COPD/chronic hypoxic RF: Reportedly stopped using home oxygen  about a week prior to coming to ED.  It seems she desaturated to 77% at midnight on room air. Currently saturating in upper 90s on 3 L by nasal cannula.   -Wean oxygen  as able -Continue LABA/ICS  MDS/CMML/pancytopenia: Now with leukocytosis, anemia and thrombocytopenia.  No report of overt bleeding.  She was transfused 1 unit on 2/2 and Hgb improved to 8.5.  Recent Labs    11/16/23 1008 11/22/23 1418 12/03/23 1325 12/03/23 1331 12/03/23 1757 12/04/23 0757 12/05/23 0325 12/05/23 1428 12/06/23 0323 12/07/23 0602  HGB 7.6* 8.7* 8.2* 9.5* 8.8* 7.3* 6.8* 7.9* 8.5* 7.6*  -Monitor H&H. -SCD for VTE prophylaxis  Dysphagia -Dysphagia 3 diet per SLP  Chronic diastolic CHF: TTE 07/2023 with LVEF of 60 to 65%, moderate LVH and G2-DD, severe LAE, mild to moderate MR and moderate to severe AS.  Patient denies cardiopulmonary symptoms but not a great historian.  She is on Lasix  at home.  Appears euvolemic except for BLE edema. -Continue holding Lasix  in the setting of AKI -Strict intake and output, daily weight, renal functions and electrolytes   Non-anion gap metabolic acidosis: Likely due to AKI -IV sodium bicarbonate  as above  History of stage I breast cancer s/p  lumpectomy and XRT in 1999 -Tamoxifen  History of stage I left upper lobe lung cancer:  s/p left upper lobe lobectomy in 2006 -Outpatient follow-up.   Hypothyroidism -On Synthroid   Physical  deconditioning -PT/OT  Body mass index is 22.13 kg/m.   Place and maintain sequential compression device Start: 12/04/23 1556 SCDs Start: 12/03/23 1805  Code Status: Full code Family Communication: None at bedside Level of care: Telemetry Medical Status is: Inpatient Remains inpatient appropriate because: Acute encephalopathy, UTI, hyponatremia and AKI   Final disposition: To be determined after therapy evaluation Consultants:  Pulmonology Palliative medicine  55 minutes with more than 50% spent in reviewing records, counseling patient/family and coordinating care.   Sch Meds:  Scheduled Meds:  Chlorhexidine  Gluconate Cloth  6 each Topical Daily   estradiol   1 Applicatorful Vaginal QHS   fluticasone  furoate-vilanterol  1 puff Inhalation Daily   And   umeclidinium bromide   1 puff Inhalation Daily   OLANZapine   5 mg Oral QHS   pantoprazole   40 mg Oral Q0600   pravastatin   40 mg Oral QPM   sodium chloride  flush  3 mL Intravenous Q12H   traZODone   100 mg Oral QHS   Continuous Infusions:  cefTRIAXone  (ROCEPHIN )  IV 2 g (12/06/23 1810)   PRN Meds:.acetaminophen , ondansetron  (ZOFRAN ) IV, QUEtiapine   Antimicrobials: Anti-infectives (From  admission, onward)    Start     Dose/Rate Route Frequency Ordered Stop   12/04/23 1900  cefTRIAXone  (ROCEPHIN ) 2 g in sodium chloride  0.9 % 100 mL IVPB        2 g 200 mL/hr over 30 Minutes Intravenous Every 24 hours 12/04/23 0839     12/03/23 1900  cefTRIAXone  (ROCEPHIN ) 1 g in sodium chloride  0.9 % 100 mL IVPB  Status:  Discontinued        1 g 200 mL/hr over 30 Minutes Intravenous Every 24 hours 12/03/23 1820 12/04/23 0839        I have personally reviewed the following labs and images: CBC: Recent Labs  Lab 12/03/23 1325 12/03/23 1331 12/04/23 0757 12/05/23 0325 12/05/23 1428 12/06/23 0323 12/07/23 0602  WBC 9.4  --  12.2* 13.1*  --  10.2 21.1*  NEUTROABS  --   --   --   --   --   --  0.4*  HGB 8.2*   < > 7.3* 6.8*  7.9* 8.5* 7.6*  HCT 28.2*   < > 24.6* 23.0* 25.2* 28.1* 25.5*  MCV 102.9*  --  99.2 100.9*  --  98.3 98.8  PLT 41*  --  39* 38*  --  34* 45*   < > = values in this interval not displayed.   BMP &GFR Recent Labs  Lab 12/04/23 0758 12/04/23 1623 12/05/23 0321 12/05/23 0323 12/06/23 0323 12/06/23 1914 12/07/23 0602  NA  --   --  125* 125* 128* 128* 128*  K  --    < > 5.2* 5.3* 4.1 3.7 4.2  CL  --   --  98 99 99 95* 98  CO2  --   --  19* 18* 19* 22 18*  GLUCOSE  --   --  126* 124* 95 117* 99  BUN  --   --  25* 25* 23 20 24*  CREATININE  --   --  1.27* 1.27* 0.96 1.03* 1.59*  CALCIUM   --   --  8.3* 8.3* 8.6* 8.3* 8.3*  MG 1.6*  --  2.0  --   --   --   --   PHOS  --   --   --  4.0  --   --   --    < > = values in this interval not displayed.   Estimated Creatinine Clearance: 24.2 mL/min (A) (by C-G formula based on SCr of 1.59 mg/dL (H)). Liver & Pancreas: Recent Labs  Lab 12/03/23 1325 12/03/23 2046 12/04/23 0757 12/05/23 0323 12/07/23 0602  AST 37 39 32  --  82*  ALT 12 11 10   --  20  ALKPHOS 42 39 34*  --  38  BILITOT 1.7* 1.2 1.3*  --  1.1  PROT 9.4* 9.3* 8.0  --  8.3*  ALBUMIN  3.2* 3.1* 2.7* 2.7* 2.9*   No results for input(s): LIPASE, AMYLASE in the last 168 hours. No results for input(s): AMMONIA in the last 168 hours. Diabetic: No results for input(s): HGBA1C in the last 72 hours. Recent Labs  Lab 12/03/23 1129 12/05/23 0620  GLUCAP 91 124*   Cardiac Enzymes: No results for input(s): CKTOTAL, CKMB, CKMBINDEX, TROPONINI in the last 168 hours. No results for input(s): PROBNP in the last 8760 hours. Coagulation Profile: No results for input(s): INR, PROTIME in the last 168 hours. Thyroid  Function Tests: No results for input(s): TSH, T4TOTAL, FREET4, T3FREE, THYROIDAB in the last 72 hours. Lipid Profile: No results  for input(s): CHOL, HDL, LDLCALC, TRIG, CHOLHDL, LDLDIRECT in the last 72 hours. Anemia  Panel: Recent Labs    12/04/23 1115  VITAMINB12 761   Urine analysis:    Component Value Date/Time   COLORURINE YELLOW 12/03/2023 1716   APPEARANCEUR HAZY (A) 12/03/2023 1716   APPEARANCEUR Clear 12/01/2023 0954   LABSPEC 1.011 12/03/2023 1716   PHURINE 5.0 12/03/2023 1716   GLUCOSEU NEGATIVE 12/03/2023 1716   HGBUR MODERATE (A) 12/03/2023 1716   BILIRUBINUR NEGATIVE 12/03/2023 1716   BILIRUBINUR Negative 12/01/2023 0954   KETONESUR NEGATIVE 12/03/2023 1716   PROTEINUR 100 (A) 12/03/2023 1716   NITRITE NEGATIVE 12/03/2023 1716   LEUKOCYTESUR LARGE (A) 12/03/2023 1716   Sepsis Labs: Invalid input(s): PROCALCITONIN, LACTICIDVEN  Microbiology: Recent Results (from the past 240 hours)  Microscopic Examination     Status: None   Collection Time: 12/01/23  9:54 AM   Urine  Result Value Ref Range Status   WBC, UA 0-5 0 - 5 /hpf Final   RBC, Urine 0-2 0 - 2 /hpf Final   Epithelial Cells (non renal) 0-10 0 - 10 /hpf Final   Bacteria, UA None seen None seen/Few Final  Culture, blood (routine x 2)     Status: None (Preliminary result)   Collection Time: 12/03/23  1:25 PM   Specimen: BLOOD  Result Value Ref Range Status   Specimen Description BLOOD RIGHT ANTECUBITAL  Final   Special Requests   Final    BOTTLES DRAWN AEROBIC AND ANAEROBIC Blood Culture results may not be optimal due to an inadequate volume of blood received in culture bottles   Culture   Final    NO GROWTH 4 DAYS Performed at [facility], [address] [place], [place] [address]    Report Status PENDING  Incomplete  Culture, blood (routine x 2)     Status: None (Preliminary result)   Collection Time: 12/03/23  1:25 PM   Specimen: BLOOD  Result Value Ref Range Status   Specimen Description BLOOD RIGHT ANTECUBITAL  Final   Special Requests   Final    AEROBIC BOTTLE ONLY Blood Culture results may not be optimal due to an inadequate volume of blood received in culture bottles   Culture   Final     NO GROWTH 4 DAYS Performed at [facility], [address], [place], [place] [address]    Report Status PENDING  Incomplete  Urine Culture (for pregnant, neutropenic or urologic patients or patients with an indwelling urinary catheter)     Status: Abnormal   Collection Time: 12/03/23  6:08 PM   Specimen: Urine, Clean Catch  Result Value Ref Range Status   Specimen Description URINE, CLEAN CATCH  Final   Special Requests   Final    NONE Performed at [facility], [address] [place], [place] [address]    Culture (A)  Final    60,000 COLONIES/mL KLEBSIELLA PNEUMONIAE 10,000 COLONIES/mL YEAST    Report Status 12/06/2023 FINAL  Final   Organism ID, Bacteria KLEBSIELLA PNEUMONIAE (A)  Final      Susceptibility   Klebsiella pneumoniae - MIC*    AMPICILLIN RESISTANT Resistant     CEFAZOLIN <=4 SENSITIVE Sensitive     CEFEPIME  <=0.12 SENSITIVE Sensitive     CEFTRIAXONE  <=0.25 SENSITIVE Sensitive     CIPROFLOXACIN <=0.25 SENSITIVE Sensitive     GENTAMICIN <=1 SENSITIVE Sensitive     IMIPENEM <=0.25 SENSITIVE Sensitive     NITROFURANTOIN 64  INTERMEDIATE Intermediate     TRIMETH /SULFA  <=20 SENSITIVE Sensitive     AMPICILLIN/SULBACTAM <=2 SENSITIVE Sensitive     PIP/TAZO <=4 SENSITIVE Sensitive ug/mL    * 60,000 COLONIES/mL KLEBSIELLA PNEUMONIAE  MRSA Next Gen by PCR, Nasal     Status: None   Collection Time: 12/05/23  6:06 AM   Specimen: Nasal Mucosa; Nasal Swab  Result Value Ref Range Status   MRSA by PCR Next Gen NOT DETECTED NOT DETECTED Final    Comment: (NOTE) The GeneXpert MRSA Assay (FDA approved for NASAL specimens only), is one component of a comprehensive MRSA colonization surveillance program. It is not intended to diagnose MRSA infection nor to guide or monitor treatment for MRSA infections. Test performance is not FDA approved in patients less than 89 years old. Performed at [facility], [address] [place], [place] [address]     Radiology  Studies: No results found.    Janika Jedlicka T. Joyce Heitman [facility]  If 7PM-7AM, please contact night-coverage [email or web address] 12/07/2023, 10:50 AM

## 2023-12-08 DIAGNOSIS — J9611 Chronic respiratory failure with hypoxia: Secondary | ICD-10-CM | POA: Diagnosis not present

## 2023-12-08 DIAGNOSIS — Z7189 Other specified counseling: Secondary | ICD-10-CM

## 2023-12-08 DIAGNOSIS — G9340 Encephalopathy, unspecified: Secondary | ICD-10-CM | POA: Diagnosis not present

## 2023-12-08 DIAGNOSIS — N1831 Chronic kidney disease, stage 3a: Secondary | ICD-10-CM | POA: Diagnosis not present

## 2023-12-08 DIAGNOSIS — D469 Myelodysplastic syndrome, unspecified: Secondary | ICD-10-CM | POA: Diagnosis not present

## 2023-12-08 DIAGNOSIS — C3492 Malignant neoplasm of unspecified part of left bronchus or lung: Secondary | ICD-10-CM | POA: Diagnosis not present

## 2023-12-08 DIAGNOSIS — R627 Adult failure to thrive: Secondary | ICD-10-CM | POA: Diagnosis not present

## 2023-12-08 LAB — CULTURE, BLOOD (ROUTINE X 2)
Culture: NO GROWTH
Culture: NO GROWTH

## 2023-12-08 LAB — RETICULOCYTES
Immature Retic Fract: 26.8 % — ABNORMAL HIGH (ref 2.3–15.9)
RBC.: 2.46 MIL/uL — ABNORMAL LOW (ref 3.87–5.11)
Retic Count, Absolute: 112.9 10*3/uL (ref 19.0–186.0)
Retic Ct Pct: 4.6 % — ABNORMAL HIGH (ref 0.4–3.1)

## 2023-12-08 LAB — CBC
HCT: 24 % — ABNORMAL LOW (ref 36.0–46.0)
Hemoglobin: 7.2 g/dL — ABNORMAL LOW (ref 12.0–15.0)
MCH: 29.3 pg (ref 26.0–34.0)
MCHC: 30 g/dL (ref 30.0–36.0)
MCV: 97.6 fL (ref 80.0–100.0)
Platelets: 48 10*3/uL — ABNORMAL LOW (ref 150–400)
RBC: 2.46 MIL/uL — ABNORMAL LOW (ref 3.87–5.11)
RDW: 19.3 % — ABNORMAL HIGH (ref 11.5–15.5)
WBC: 21.4 10*3/uL — ABNORMAL HIGH (ref 4.0–10.5)
nRBC: 4.5 % — ABNORMAL HIGH (ref 0.0–0.2)

## 2023-12-08 LAB — RENAL FUNCTION PANEL
Albumin: 2.6 g/dL — ABNORMAL LOW (ref 3.5–5.0)
Anion gap: 13 (ref 5–15)
BUN: 25 mg/dL — ABNORMAL HIGH (ref 8–23)
CO2: 20 mmol/L — ABNORMAL LOW (ref 22–32)
Calcium: 7.8 mg/dL — ABNORMAL LOW (ref 8.9–10.3)
Chloride: 94 mmol/L — ABNORMAL LOW (ref 98–111)
Creatinine, Ser: 1.23 mg/dL — ABNORMAL HIGH (ref 0.44–1.00)
GFR, Estimated: 43 mL/min — ABNORMAL LOW (ref >60)
Glucose, Bld: 80 mg/dL (ref 70–99)
Phosphorus: 1.7 mg/dL — ABNORMAL LOW (ref 2.5–4.6)
Potassium: 4.4 mmol/L (ref 3.5–5.1)
Sodium: 127 mmol/L — ABNORMAL LOW (ref 135–145)

## 2023-12-08 LAB — BRAIN NATRIURETIC PEPTIDE: B Natriuretic Peptide: 1344 pg/mL — ABNORMAL HIGH (ref 0.0–100.0)

## 2023-12-08 LAB — IRON AND TIBC
Iron: 36 ug/dL (ref 28–170)
Saturation Ratios: 21 % (ref 10.4–31.8)
TIBC: 171 ug/dL — ABNORMAL LOW (ref 250–450)
UIBC: 135 ug/dL

## 2023-12-08 LAB — HEMOGLOBIN AND HEMATOCRIT, BLOOD
HCT: 29 % — ABNORMAL LOW (ref 36.0–46.0)
Hemoglobin: 8.8 g/dL — ABNORMAL LOW (ref 12.0–15.0)

## 2023-12-08 LAB — VITAMIN B12: Vitamin B-12: 720 pg/mL (ref 180–914)

## 2023-12-08 LAB — MAGNESIUM: Magnesium: 1.8 mg/dL (ref 1.7–2.4)

## 2023-12-08 LAB — PREPARE RBC (CROSSMATCH)

## 2023-12-08 LAB — FOLATE: Folate: 36.1 ng/mL (ref >5.9)

## 2023-12-08 LAB — FERRITIN: Ferritin: 1465 ng/mL — ABNORMAL HIGH (ref 11–307)

## 2023-12-08 MED ORDER — SODIUM PHOSPHATES 45 MMOLE/15ML IV SOLN
30.0000 mmol | Freq: Once | INTRAVENOUS | Status: AC
  Administered 2023-12-08: 30 mmol via INTRAVENOUS
  Filled 2023-12-08: qty 10

## 2023-12-08 MED ORDER — SODIUM CHLORIDE 0.9% IV SOLUTION
Freq: Once | INTRAVENOUS | Status: AC
Start: 2023-12-08 — End: 2023-12-08

## 2023-12-08 MED ORDER — SODIUM CHLORIDE 0.45 % IV SOLN
INTRAVENOUS | Status: AC
  Filled 2023-12-08 (×2): qty 75

## 2023-12-08 NOTE — Progress Notes (Addendum)
 Patient ID: [name], female   DOB: Sep 21, 1934, 89 y.o.   MRN: [id number]    Progress Note from the Palliative Medicine Team at [facility]   Patient Name: Lisa [name]        Date: 12/08/2023 DOB: 06-10-1934  Age: 89 y.o. MRN#: [id number] Attending Physician: Kathrin Mignon DASEN, MD Primary Care Physician: Shona Norleen PEDLAR, MD Admit Date: 12/03/2023   Reason for Consultation/Follow-up   Establishing Goals of Care   HPI/ Brief Hospital Review  89 y.o. female   admitted on 12/03/2023 with metabolic encephalopathy likely secondary to urinary tract infection with multiple comorbidities    Admitted for treatment and stabilization; interventions including fluid resuscitation, midodrine  and albumin  infusion for hypotension   Multiple underlying comorbidities including myelodysplastic syndrome, past history of adenocarcinoma of the left lung, invasive ductal carcinoma of the left breast which is being treated, stage III chronic kidney disease, paroxysmal atrial fibrillation, chronic diastolic heart failure, history of chronic myelomonocytic leukemia, chronic obstructive pulmonary disease, chronic respiratory failure   This is patient's fourth admission in the last 6 months.    Patient and family face treatment option decisions, advanced directive decisions and anticipatory care needs.      Subjective  Extensive chart review has been completed prior to meeting with patient/family  including labs, vital signs, imaging, progress/consult notes, orders, medications and available advance directive documents.    This NP assessed patient at the bedside as a follow up for palliative medicine needs and emotional support.    Earlier this week left phone message for [facility], still await callback.  Spoke to daughter/[name] Beckom by phone.  Patient lives with this daughter.  Education offered on patient's multiple comorbidities and her high risk for decompensation.  Erminio tells me she and her  family understand the situation.  At this point goals are clear, family is open to all offered and available medical interventions to prolong life.     Patient remains a full code.      Educated /family to consider DNR/DNI status understanding evidenced based poor outcomes in similar hospitalized patient, as the cause of arrest is likely associated with advanced chronic illness rather than an easily reversible acute cardio-pulmonary event.    Plan is for discharge home when patient is medically stable.    Education offered today regarding  the importance of continued conversation with family and their  medical providers regarding overall plan of care and treatment options,  ensuring decisions are within the context of the patients values and GOCs.  Questions and concerns addressed   Discussed with primary team   PMT will sign off at this time, please reconsult if further needs into the future.   Time: 50   minutes  Detailed review of medical records ( labs, imaging, vital signs), medically appropriate exam ( MS, skin, cardiac,  resp)   discussed with treatment team, counseling and education to patient, family, staff, documenting clinical information, medication management, coordination of care    Ronal Plants NP  Palliative Medicine Team Team Phone # [phone] Pager [phone]

## 2023-12-08 NOTE — Progress Notes (Signed)
 PROGRESS NOTE  Lisa [name] FMW:[id number] DOB: Jun 30, 1934   PCP: Shona Norleen PEDLAR, MD  Patient is from: Home.  Lives with daughter.  DOA: 12/03/2023 LOS: 4  Chief complaints Chief Complaint  Patient presents with   Altered Mental Status     Brief Narrative / Interim history: 89 year old F with PMH of dementia, COPD, chronic hypoxic RF, MDS, pancytopenia, paroxysmal A-fib, diastolic CHF, AAA, chronic hyponatremia, left lung cancer, left breast cancer s/p lumpectomy, CKD-3A, recent UTI and moderate aortic stenosis brought to ED due to altered mental status, decreased appetite/p.o. intake.  Reportedly not wearing oxygen  for about a week.  She was admitted with working diagnosis of acute metabolic encephalopathy, AKI, kalemia, hyponatremia urinary tract infection.  Vitals stable.  K6.3.Cr 1.67 (baseline 1.0).  Protein 9.4.  Albumin  3.2.  Na 129 (baseline).  Hgb 8.2.  UA concerning for UTI.  VBG reassuring.  CT head without acute finding.  Cultures obtained.  Patient was started on IV ceftriaxone , IV fluid and electrolyte replenishment.  Patient had persistent hypotension concerning for septic shock.  He was transferred to ICU on 2/2 and started on vasopressors.  Blood cultures NGTD.  Urine culture with 60,000 colonies of Klebsiella pneumonia and 10,000 colonies of yeast.  Eventually, she came off vasopressor.  She was transferred to regular floor on 2/3, and TRH assumed care on 2/4.  Palliative medicine consulted.  Therapy recommended SNF.  Subjective: Seen and examined earlier this morning.  No major events overnight of this morning.  No complaints.  She is awake and alert and oriented x 4 except date of month.  He denies pain, shortness of breath, GI or UTI symptoms.  Objective: Vitals:   12/08/23 0937 12/08/23 1218 12/08/23 1219 12/08/23 1234  BP: 114/62 (!) 106/57  106/69  Pulse: (!) 105   91  Resp: 20   18  Temp: 98.2 F (36.8 C) 98 F (36.7 C)  98 F (36.7 C)  TempSrc: Oral Oral     SpO2:   95%   Weight:      Height:        Examination:  GENERAL: No apparent distress.  Nontoxic. HEENT: MMM.  Vision and hearing grossly intact.  NECK: Supple.  No apparent JVD.  RESP:  No IWOB.  Fair aeration bilaterally. CVS:  RRR. Heart sounds normal.  ABD/GI/GU: BS+. Abd soft, NTND.  MSK/EXT:  Moves extremities. No apparent deformity. No edema.  SKIN: no apparent skin lesion or wound NEURO: Awake and alert.  Oriented x 4 except date and month.  Follows commands.  Limited insight.  Motor 2/5 in BLE.  Otherwise no focal neurodeficit.  PSYCH: Calm. Normal affect.   Procedures:  None  Microbiology summarized: Blood cultures NGTD Urine culture with 60,000 colonies Klebsiella pneumonia and 10,000 colonies of yeast  Assessment and plan: Acute metabolic encephalopathy: Multifactorial including underlying dementia, urinary tract infection, hyponatremia, dehydration and polypharmacy.  Her UA and urine culture concerning for UTI.  VBG is reassuring.  CT head without acute finding.  She is on Zyprexa , Seroquel , trazodone  and Remeron  which could put her at risk of polypharmacy.  She has no focal neurodeficit other than symmetric BLE weakness.  Encephalopathy seems to have resolved.  She is currently awake and alert and oriented x 4 except date and month.  -Completed antibiotic course of ceftriaxone . -Agree with discontinuing Remeron  indefinitely -Reorientation and delirium precautions. -Can consider decreasing trazodone  if mental status worse -Avoid benzodiazepines -PT/OT-recommended SNF.   Urinary tract infection: Presents with  altered mental status, poor p.o. intake and dehydration.  UA consistent with UTI.  Urine culture as above.  Completed ceftriaxone  from 1/31-2/5.  AKI on CKD-3A: Likely due to dehydration in the setting of poor p.o. intake.  B/l Cr~0.9.  Takes Lasix  20 mg twice daily at home.  Appears euvolemic on exam.  Recent Labs    11/22/23 1418 12/03/23 1325  12/03/23 2046 12/04/23 0757 12/05/23 0321 12/05/23 0323 12/06/23 0323 12/06/23 1914 12/07/23 0602 12/08/23 0710  BUN 15 32* 33* 27* 25* 25* 23 20 24* 25*  CREATININE 1.32* 1.67* 1.63* 1.38* 1.27* 1.27* 0.96 1.03* 1.59* 1.23*  -Continue sodium bicarbonate  infusion for another 24 hours. -Strict intake and output -Recheck in the morning  Hyperkalemia/chronic hyponatremia: Hyperkalemia resolved. Na seems to be at baseline. -Monitor  Chronic COPD/chronic hypoxic RF: Reportedly stopped using home oxygen  about a week prior to coming to ED.  It seems she desaturated to 77% at midnight on room air. Currently saturating in upper 90s on 3 L by nasal cannula.   -Wean oxygen  as able -Continue LABA/ICS  MDS/CMML/pancytopenia: Now with leukocytosis (monocytosis)., anemia and thrombocytopenia.  No report of overt bleeding.  She was transfused 1 unit on 2/2 with some improvement in Hgb.  Likely due to underlying CMML. Recent Labs    11/22/23 1418 12/03/23 1325 12/03/23 1331 12/03/23 1757 12/04/23 0757 12/05/23 0325 12/05/23 1428 12/06/23 0323 12/07/23 0602 12/08/23 0710  HGB 8.7* 8.2* 9.5* 8.8* 7.3* 6.8* 7.9* 8.5* 7.6* 7.2*  -Transfuse additional 1 unit -SCD for VTE prophylaxis -Will run by patient's hematologist/oncologist  Chronic diastolic CHF: TTE 07/2023 with LVEF of 60 to 65%, moderate LVH and G2-DD, severe LAE, mild to moderate MR and moderate to severe AS.  Patient denies cardiopulmonary symptoms but not a great historian.  She is on Lasix  at home.  Appears euvolemic on exam. -Continue holding Lasix  in the setting of AKI -Strict intake and output, daily weight, renal functions and electrolytes   Non-anion gap metabolic acidosis: Likely due to AKI -Continue IV sodium bicarbonate  as above  History of stage I breast cancer s/p  lumpectomy and XRT in 1999 -Tamoxifen  History of stage I left upper lobe lung cancer:  s/p left upper lobe lobectomy in 2006 -Outpatient follow-up.    Hypothyroidism -On Synthroid   Physical deconditioning -PT/OT  Leukocytosis with monocytosis: Acute reactant? -Will discuss with patient's hematologist. -Monitor.  Dysphagia -Dysphagia 3 diet per SLP  Body mass index is 21.82 kg/m.  DVT prophylaxis Place and maintain sequential compression device Start: 12/08/23 1029 Place and maintain sequential compression device Start: 12/04/23 1556 SCDs Start: 12/03/23 1805  Code Status: Full code Family Communication: None at bedside Level of care: Telemetry Medical Status is: Inpatient Remains inpatient appropriate because: Anemia, AKI and encephalopathy   Final disposition: SNF. Consultants:  Pulmonology Palliative medicine  55 minutes with more than 50% spent in reviewing records, counseling patient/family and coordinating care.   Sch Meds:  Scheduled Meds:  Chlorhexidine  Gluconate Cloth  6 each Topical Daily   estradiol   1 Applicatorful Vaginal QHS   feeding supplement (NEPRO CARB STEADY)  237 mL Oral BID BM   fluticasone  furoate-vilanterol  1 puff Inhalation Daily   And   umeclidinium bromide   1 puff Inhalation Daily   multivitamin with minerals  1 tablet Per Tube Daily   OLANZapine   5 mg Oral QHS   pantoprazole   40 mg Oral Q0600   pravastatin   40 mg Oral QPM   sodium chloride  flush  3  mL Intravenous Q12H   traZODone   100 mg Oral QHS   Continuous Infusions:  sodium PHOSPHATE  IVPB (in mmol)     PRN Meds:.acetaminophen , ondansetron  (ZOFRAN ) IV, [COMPLETED] polyethylene glycol **FOLLOWED BY** polyethylene glycol, QUEtiapine , [COMPLETED] senna-docusate **FOLLOWED BY** senna-docusate  Antimicrobials: Anti-infectives (From admission, onward)    Start     Dose/Rate Route Frequency Ordered Stop   12/04/23 1900  cefTRIAXone  (ROCEPHIN ) 2 g in sodium chloride  0.9 % 100 mL IVPB  Status:  Discontinued        2 g 200 mL/hr over 30 Minutes Intravenous Every 24 hours 12/04/23 0839 12/08/23 0756   12/03/23 1900  cefTRIAXone   (ROCEPHIN ) 1 g in sodium chloride  0.9 % 100 mL IVPB  Status:  Discontinued        1 g 200 mL/hr over 30 Minutes Intravenous Every 24 hours 12/03/23 1820 12/04/23 0839        I have personally reviewed the following labs and images: CBC: Recent Labs  Lab 12/04/23 0757 12/05/23 0325 12/05/23 1428 12/06/23 0323 12/07/23 0602 12/08/23 0710  WBC 12.2* 13.1*  --  10.2 21.1* 21.4*  NEUTROABS  --   --   --   --  0.4*  --   HGB 7.3* 6.8* 7.9* 8.5* 7.6* 7.2*  HCT 24.6* 23.0* 25.2* 28.1* 25.5* 24.0*  MCV 99.2 100.9*  --  98.3 98.8 97.6  PLT 39* 38*  --  34* 45* 48*   BMP &GFR Recent Labs  Lab 12/04/23 0758 12/04/23 1623 12/05/23 0321 12/05/23 0323 12/06/23 0323 12/06/23 1914 12/07/23 0602 12/08/23 0710  NA  --   --  125* 125* 128* 128* 128* 127*  K  --    < > 5.2* 5.3* 4.1 3.7 4.2 4.4  CL  --   --  98 99 99 95* 98 94*  CO2  --   --  19* 18* 19* 22 18* 20*  GLUCOSE  --   --  126* 124* 95 117* 99 80  BUN  --   --  25* 25* 23 20 24* 25*  CREATININE  --   --  1.27* 1.27* 0.96 1.03* 1.59* 1.23*  CALCIUM   --   --  8.3* 8.3* 8.6* 8.3* 8.3* 7.8*  MG 1.6*  --  2.0  --   --   --   --  1.8  PHOS  --   --   --  4.0  --   --   --  1.7*   < > = values in this interval not displayed.   Estimated Creatinine Clearance: 31.3 mL/min (A) (by C-G formula based on SCr of 1.23 mg/dL (H)). Liver & Pancreas: Recent Labs  Lab 12/03/23 1325 12/03/23 2046 12/04/23 0757 12/05/23 0323 12/07/23 0602 12/08/23 0710  AST 37 39 32  --  82*  --   ALT 12 11 10   --  20  --   ALKPHOS 42 39 34*  --  38  --   BILITOT 1.7* 1.2 1.3*  --  1.1  --   PROT 9.4* 9.3* 8.0  --  8.3*  --   ALBUMIN  3.2* 3.1* 2.7* 2.7* 2.9* 2.6*   No results for input(s): LIPASE, AMYLASE in the last 168 hours. No results for input(s): AMMONIA in the last 168 hours. Diabetic: No results for input(s): HGBA1C in the last 72 hours. Recent Labs  Lab 12/03/23 1129 12/05/23 0620  GLUCAP 91 124*   Cardiac Enzymes: No  results for input(s): CKTOTAL, CKMB, CKMBINDEX, TROPONINI  in the last 168 hours. No results for input(s): PROBNP in the last 8760 hours. Coagulation Profile: No results for input(s): INR, PROTIME in the last 168 hours. Thyroid  Function Tests: No results for input(s): TSH, T4TOTAL, FREET4, T3FREE, THYROIDAB in the last 72 hours. Lipid Profile: No results for input(s): CHOL, HDL, LDLCALC, TRIG, CHOLHDL, LDLDIRECT in the last 72 hours. Anemia Panel: Recent Labs    12/08/23 0710  VITAMINB12 720  FOLATE 36.1  FERRITIN 1,465*  TIBC 171*  IRON 36  RETICCTPCT 4.6*   Urine analysis:    Component Value Date/Time   COLORURINE YELLOW 12/03/2023 1716   APPEARANCEUR HAZY (A) 12/03/2023 1716   APPEARANCEUR Clear 12/01/2023 0954   LABSPEC 1.011 12/03/2023 1716   PHURINE 5.0 12/03/2023 1716   GLUCOSEU NEGATIVE 12/03/2023 1716   HGBUR MODERATE (A) 12/03/2023 1716   BILIRUBINUR NEGATIVE 12/03/2023 1716   BILIRUBINUR Negative 12/01/2023 0954   KETONESUR NEGATIVE 12/03/2023 1716   PROTEINUR 100 (A) 12/03/2023 1716   NITRITE NEGATIVE 12/03/2023 1716   LEUKOCYTESUR LARGE (A) 12/03/2023 1716   Sepsis Labs: Invalid input(s): PROCALCITONIN, LACTICIDVEN  Microbiology: Recent Results (from the past 240 hours)  Microscopic Examination     Status: None   Collection Time: 12/01/23  9:54 AM   Urine  Result Value Ref Range Status   WBC, UA 0-5 0 - 5 /hpf Final   RBC, Urine 0-2 0 - 2 /hpf Final   Epithelial Cells (non renal) 0-10 0 - 10 /hpf Final   Bacteria, UA None seen None seen/Few Final  Culture, blood (routine x 2)     Status: None   Collection Time: 12/03/23  1:25 PM   Specimen: BLOOD  Result Value Ref Range Status   Specimen Description BLOOD RIGHT ANTECUBITAL  Final   Special Requests   Final    BOTTLES DRAWN AEROBIC AND ANAEROBIC Blood Culture results may not be optimal due to an inadequate volume of blood received in culture bottles   Culture    Final    NO GROWTH 5 DAYS Performed at [facility], [address], [place], [place] [address]    Report Status 12/08/2023 FINAL  Final  Culture, blood (routine x 2)     Status: None   Collection Time: 12/03/23  1:25 PM   Specimen: BLOOD  Result Value Ref Range Status   Specimen Description BLOOD RIGHT ANTECUBITAL  Final   Special Requests   Final    AEROBIC BOTTLE ONLY Blood Culture results may not be optimal due to an inadequate volume of blood received in culture bottles   Culture   Final    NO GROWTH 5 DAYS Performed at [facility], [address], [place], [place] [address]    Report Status 12/08/2023 FINAL  Final  Urine Culture (for pregnant, neutropenic or urologic patients or patients with an indwelling urinary catheter)     Status: Abnormal   Collection Time: 12/03/23  6:08 PM   Specimen: Urine, Clean Catch  Result Value Ref Range Status   Specimen Description URINE, CLEAN CATCH  Final   Special Requests   Final    NONE Performed at [facility], [address], [place], [place] [address]    Culture (A)  Final    60,000 COLONIES/mL KLEBSIELLA PNEUMONIAE 10,000 COLONIES/mL YEAST    Report Status 12/06/2023 FINAL  Final   Organism ID, Bacteria KLEBSIELLA PNEUMONIAE (A)  Final      Susceptibility   Klebsiella pneumoniae -  MIC*    AMPICILLIN RESISTANT Resistant     CEFAZOLIN <=4 SENSITIVE Sensitive     CEFEPIME  <=0.12 SENSITIVE Sensitive     CEFTRIAXONE  <=0.25 SENSITIVE Sensitive     CIPROFLOXACIN <=0.25 SENSITIVE Sensitive     GENTAMICIN <=1 SENSITIVE Sensitive     IMIPENEM <=0.25 SENSITIVE Sensitive     NITROFURANTOIN 64 INTERMEDIATE Intermediate     TRIMETH /SULFA  <=20 SENSITIVE Sensitive     AMPICILLIN/SULBACTAM <=2 SENSITIVE Sensitive     PIP/TAZO <=4 SENSITIVE Sensitive ug/mL    * 60,000 COLONIES/mL KLEBSIELLA PNEUMONIAE  MRSA Next Gen by PCR, Nasal     Status: None   Collection Time: 12/05/23  6:06 AM   Specimen: Nasal Mucosa; Nasal Swab   Result Value Ref Range Status   MRSA by PCR Next Gen NOT DETECTED NOT DETECTED Final    Comment: (NOTE) The GeneXpert MRSA Assay (FDA approved for NASAL specimens only), is one component of a comprehensive MRSA colonization surveillance program. It is not intended to diagnose MRSA infection nor to guide or monitor treatment for MRSA infections. Test performance is not FDA approved in patients less than 25 years old. Performed at [facility], [address], [place], [place] [address]     Radiology Studies: No results found.    Katilin Raynes T. Clifton Kovacic [facility]  If 7PM-7AM, please contact night-coverage [email or web address] 12/08/2023, 1:06 PM

## 2023-12-08 NOTE — Plan of Care (Signed)
  Problem: Education: Goal: Knowledge of General Education information will improve Description: Including pain rating scale, medication(s)/side effects and non-pharmacologic comfort measures Outcome: Progressing   Problem: Health Behavior/Discharge Planning: Goal: Ability to manage health-related needs will improve Outcome: Progressing   Problem: Clinical Measurements: Goal: Ability to maintain clinical measurements within normal limits will improve Outcome: Progressing Goal: Will remain free from infection Outcome: Progressing Goal: Diagnostic test results will improve Outcome: Progressing   Problem: Education: Goal: Knowledge of General Education information will improve Description: Including pain rating scale, medication(s)/side effects and non-pharmacologic comfort measures Outcome: Progressing   Problem: Health Behavior/Discharge Planning: Goal: Ability to manage health-related needs will improve Outcome: Progressing   Problem: Clinical Measurements: Goal: Ability to maintain clinical measurements within normal limits will improve Outcome: Progressing   Problem: Clinical Measurements: Goal: Will remain free from infection Outcome: Progressing

## 2023-12-08 NOTE — Consult Note (Signed)
 [facility] Liaison Note  12/08/2023  Lisa [name] [birth year] [id number]  Covering Richerd Fish, RN [facility] liaison)  Location: [facility] Liaison screened the patient remotely at [facility].  Insurance: Medicare   Lisa [name] is a 89 y.o. female who is a Primary Care Patient of Shona, Norleen PEDLAR, MD (Christyne Shona, MD). The patient was screened for  readmission hospitalization with noted extreme risk score for unplanned readmission risk with 4 IP/1 ED in 6 months.  The patient was assessed for potential Care Management service needs for post hospital transition for care coordination. Review of patient's electronic medical record reveals patient was admitted for acute encephalopathy. Recommended via PT therapy for SNF level of care pending post hospital discharge. If pt is discharged to SNF level of care that is an affiliated facility the liaison will refer to PAC-RN via VBCI to follow. If pt discharges to a non-affiliated SNF the facility will continue to address pt's needs.  Plan: [facility] Liaison will continue to follow progress and disposition to asess for post hospital community care coordination/management needs.  Referral request for community care coordination: pending disposition.   VBCI Care Management/Population Health does not replace or interfere with any arrangements made by the Inpatient Transition of Care team.   For questions contact:   Olam Ku, RN, [facility] Liaison [place]   [facility], Population Health Office Hours MTWF  8:00 am-6:00 pm Direct Dial: [phone] mobile [phone] [Office toll free line] Office Hours are M-F 8:30 - 5 pm [email or web address]

## 2023-12-08 NOTE — TOC Progression Note (Signed)
 Transition of Care [facility]) - Progression Note    Patient Details  Name: Lisa [name] MRN: [id number] Date of Birth: July 29, 1934  Transition of [facility]) CM/SW Contact  Keveon Amsler A Sheikh Leverich, LCSW Phone Number: 12/08/2023, 5:16 PM  Clinical Narrative:     CSW contacted pt's grandson, HCPOA, but received no answer.   CSW reached out and spoke with pt's daughter, Erminio, to discuss recommendation for SNF. She stated that she had been to rehab back in October and are not sure that makes sense at this time. She said she would reach out to pt's grandson, her HCPOA to discuss options for home versus SNF and let CSW know decision.    TOC will continue to follow.       Expected Discharge Plan and Services                                               Social Determinants of Health (SDOH) Interventions SDOH Screenings   Food Insecurity: No Food Insecurity (12/04/2023)  Housing: Low Risk  (12/04/2023)  Transportation Needs: No Transportation Needs (12/04/2023)  Utilities: Not At Risk (12/04/2023)  Depression (PHQ2-9): Low Risk  (12/12/2020)  Physical Activity: Inactive (12/12/2020)  Social Connections: Unknown (12/04/2023)  Stress: No Stress Concern Present (08/27/2023)   Received from [facility]  Tobacco Use: Medium Risk (12/01/2023)    Readmission Risk Interventions    08/16/2023   11:37 AM 08/03/2023   11:15 AM 07/13/2023   11:15 AM  Readmission Risk Prevention Plan  Transportation Screening Complete Complete Complete  Medication Review [employer])  Referral to Pharmacy Complete  PCP or Specialist appointment within 3-5 days of discharge  Complete   HRI or Home Care Consult  Complete Complete  SW Recovery Care/Counseling Consult  Complete Complete  Palliative Care Screening  Not Applicable Not Applicable  Skilled Nursing Facility  Not Applicable Not Applicable

## 2023-12-09 DIAGNOSIS — D469 Myelodysplastic syndrome, unspecified: Secondary | ICD-10-CM | POA: Diagnosis not present

## 2023-12-09 DIAGNOSIS — N1831 Chronic kidney disease, stage 3a: Secondary | ICD-10-CM | POA: Diagnosis not present

## 2023-12-09 DIAGNOSIS — G9340 Encephalopathy, unspecified: Secondary | ICD-10-CM | POA: Diagnosis not present

## 2023-12-09 DIAGNOSIS — C3492 Malignant neoplasm of unspecified part of left bronchus or lung: Secondary | ICD-10-CM | POA: Diagnosis not present

## 2023-12-09 LAB — CBC WITH DIFFERENTIAL/PLATELET
Abs Immature Granulocytes: 0.41 10*3/uL — ABNORMAL HIGH (ref 0.00–0.07)
Basophils Absolute: 0 10*3/uL (ref 0.0–0.1)
Basophils Relative: 0 %
Eosinophils Absolute: 0 10*3/uL (ref 0.0–0.5)
Eosinophils Relative: 0 %
HCT: 25.2 % — ABNORMAL LOW (ref 36.0–46.0)
Hemoglobin: 8.1 g/dL — ABNORMAL LOW (ref 12.0–15.0)
Immature Granulocytes: 2 %
Lymphocytes Relative: 9 %
Lymphs Abs: 1.6 10*3/uL (ref 0.7–4.0)
MCH: 30.1 pg (ref 26.0–34.0)
MCHC: 32.1 g/dL (ref 30.0–36.0)
MCV: 93.7 fL (ref 80.0–100.0)
Monocytes Absolute: 15.3 10*3/uL — ABNORMAL HIGH (ref 0.1–1.0)
Monocytes Relative: 87 %
Neutro Abs: 0.4 10*3/uL — CL (ref 1.7–7.7)
Neutrophils Relative %: 2 %
Platelets: 51 10*3/uL — ABNORMAL LOW (ref 150–400)
RBC: 2.69 MIL/uL — ABNORMAL LOW (ref 3.87–5.11)
RDW: 19.1 % — ABNORMAL HIGH (ref 11.5–15.5)
Smear Review: NORMAL
WBC: 17.7 10*3/uL — ABNORMAL HIGH (ref 4.0–10.5)
nRBC: 4.2 % — ABNORMAL HIGH (ref 0.0–0.2)

## 2023-12-09 LAB — TYPE AND SCREEN
ABO/RH(D): B POS
Antibody Screen: POSITIVE
DAT, IgG: POSITIVE
Status of Unit: NEGATIVE
Status of Unit: NEGATIVE
Unit division: 0
Unit division: 0
Unit division: 0
Unit division: 0

## 2023-12-09 LAB — BPAM RBC
Blood Product Expiration Date: 202502162359
Blood Product Expiration Date: 202502172359
ISSUE DATE / TIME: 202502020929
ISSUE DATE / TIME: 202502051116
Unit Type and Rh: 202502172359
Unit Type and Rh: 7300
Unit Type and Rh: 7300

## 2023-12-09 LAB — RENAL FUNCTION PANEL
Albumin: 2.3 g/dL — ABNORMAL LOW (ref 3.5–5.0)
Anion gap: 9 (ref 5–15)
BUN: 22 mg/dL (ref 8–23)
CO2: 25 mmol/L (ref 22–32)
Calcium: 7.2 mg/dL — ABNORMAL LOW (ref 8.9–10.3)
Chloride: 96 mmol/L — ABNORMAL LOW (ref 98–111)
Creatinine, Ser: 0.89 mg/dL (ref 0.44–1.00)
GFR, Estimated: >60 mL/min (ref >60)
Glucose, Bld: 96 mg/dL (ref 70–99)
Phosphorus: 2.1 mg/dL — ABNORMAL LOW (ref 2.5–4.6)
Potassium: 4.1 mmol/L (ref 3.5–5.1)
Sodium: 130 mmol/L — ABNORMAL LOW (ref 135–145)

## 2023-12-09 LAB — MAGNESIUM: Magnesium: 1.6 mg/dL — ABNORMAL LOW (ref 1.7–2.4)

## 2023-12-09 MED ORDER — TRAZODONE HCL 50 MG PO TABS
100.0000 mg | ORAL_TABLET | Freq: Every evening | ORAL | Status: DC | PRN
  Administered 2023-12-09: 100 mg via ORAL
  Filled 2023-12-09: qty 2

## 2023-12-09 MED ORDER — ADULT MULTIVITAMIN W/MINERALS CH
1.0000 | ORAL_TABLET | Freq: Every day | ORAL | Status: DC
  Administered 2023-12-10: 1 via ORAL
  Filled 2023-12-09: qty 1

## 2023-12-09 MED ORDER — METOPROLOL TARTRATE 12.5 MG HALF TABLET
12.5000 mg | ORAL_TABLET | Freq: Two times a day (BID) | ORAL | Status: DC
  Administered 2023-12-09 – 2023-12-10 (×3): 12.5 mg via ORAL
  Filled 2023-12-09 (×3): qty 1

## 2023-12-09 MED ORDER — MAGNESIUM SULFATE 2 GM/50ML IV SOLN
2.0000 g | Freq: Once | INTRAVENOUS | Status: AC
  Administered 2023-12-09: 2 g via INTRAVENOUS
  Filled 2023-12-09: qty 50

## 2023-12-09 MED ORDER — MIRTAZAPINE 7.5 MG PO TABS
7.5000 mg | ORAL_TABLET | Freq: Every day | ORAL | Status: DC
  Administered 2023-12-09: 7.5 mg via ORAL
  Filled 2023-12-09 (×2): qty 1

## 2023-12-09 NOTE — Progress Notes (Signed)
 PROGRESS NOTE  Lisa GWYNN FMW:[id number] DOB: [birth year]   PCP: Shona Norleen PEDLAR, MD  Patient is from: Home.  Lives with daughter.  DOA: 12/03/2023 LOS: 5  Chief complaints Chief Complaint  Patient presents with   Altered Mental Status     Brief Narrative / Interim history: 89 year old F with PMH of dementia, COPD, chronic hypoxic RF, MDS, pancytopenia, paroxysmal A-fib, diastolic CHF, AAA, chronic hyponatremia, left lung cancer, left breast cancer s/p lumpectomy, CKD-3A, recent UTI and moderate aortic stenosis brought to ED due to altered mental status, decreased appetite/p.o. intake.  Reportedly not wearing oxygen  for about a week.  She was admitted with working diagnosis of acute metabolic encephalopathy, AKI, kalemia, hyponatremia urinary tract infection.  Vitals stable.  K6.3.Cr 1.67 (baseline 1.0).  Protein 9.4.  Albumin  3.2.  Na 129 (baseline).  Hgb 8.2.  UA concerning for UTI.  VBG reassuring.  CT head without acute finding.  Cultures obtained.  Patient was started on IV ceftriaxone , IV fluid and electrolyte replenishment.  Patient had persistent hypotension concerning for septic shock.  He was transferred to ICU on 2/2 and started on vasopressors.  Blood cultures NGTD.  Urine culture with 60,000 colonies of Klebsiella pneumonia and 10,000 colonies of yeast.  Eventually, she came off vasopressor.  She was transferred to regular floor on 2/3, and TRH assumed care on 2/4.  Palliative medicine consulted.  Remains full code with full scope of care.  Therapy recommended SNF.  Subjective: Seen and examined earlier this morning.  No major events overnight of this morning.  Reports some pain in both feet.  Slightly tachycardic symptomatic from this.  Objective: Vitals:   12/09/23 0706 12/09/23 0842 12/09/23 0853 12/09/23 1042  BP:  111/66  107/60  Pulse:  (!) 118  (!) 108  Resp:  18  17  Temp:  97.7 F (36.5 C)  98 F (36.7 C)  TempSrc:  Oral  Oral  SpO2:  95% 94% 97%  Weight:  65.4 kg     Height:        Examination:  GENERAL: No apparent distress.  Nontoxic. HEENT: MMM.  Vision and hearing grossly intact.  NECK: Supple.  No apparent JVD.  RESP:  No IWOB.  Fair aeration bilaterally. CVS: Tachycardic to 110s.  Irregular rhythm.  Heart sounds normal.  ABD/GI/GU: BS+. Abd soft, NTND.  MSK/EXT:  Moves extremities. No apparent deformity. No edema.  SKIN: no apparent skin lesion or wound NEURO: Awake and alert.  Oriented x 4 except date and month.  Follows commands.  Limited insight.  Motor 2/5 in BLE.  Otherwise no focal neurodeficit.  PSYCH: Calm. Normal affect.   Procedures:  None  Microbiology summarized: Blood cultures NGTD Urine culture with 60,000 colonies Klebsiella pneumonia and 10,000 colonies of yeast  Assessment and plan: Acute metabolic encephalopathy: Multifactorial including underlying dementia, urinary tract infection, hyponatremia, dehydration and polypharmacy.  Her UA and urine culture concerning for UTI.  VBG is reassuring.  CT head without acute finding.  She is on Zyprexa , trazodone  and Remeron  which could put her at risk of polypharmacy.  She has no focal neurodeficit other than symmetric BLE weakness.  Encephalopathy seems to have resolved.  She is currently awake and alert and oriented x 4 except date and month.  -Completed antibiotic course of ceftriaxone . -Reorientation and delirium precautions. -Resumed low-dose Remeron , changed trazodone  to as needed and discontinued Seroquel  after talking with patient's grandson, Leanor who is HCPOA.  Continue Zyprexa  -Avoid benzodiazepines -PT/OT-recommended SNF.  Paroxysmal atrial fibrillation with mild RVR: HR in 100s to 110s.  He had Toprol  was on hold due to hypotension and sepsis.  Not on anticoagulation likely due to anemia/pancytopenia. -Resume metoprolol  at 12.5 mg twice daily. -Optimize electrolytes.   Urinary tract infection: Presents with altered mental status, poor p.o. intake and  dehydration.  UA consistent with UTI.  Urine culture as above.  Completed ceftriaxone  from 1/31-2/5.  AKI on CKD-3A: Likely due to dehydration in the setting of poor p.o. intake.  B/l Cr~0.9.  Takes Lasix  20 mg twice daily at home.  Appears euvolemic on exam.  AKI resolved. Recent Labs    12/03/23 1325 12/03/23 2046 12/04/23 0757 12/05/23 0321 12/05/23 0323 12/06/23 0323 12/06/23 1914 12/07/23 0602 12/08/23 0710 12/09/23 0723  BUN 32* 33* 27* 25* 25* 23 20 24* 25* 22  CREATININE 1.67* 1.63* 1.38* 1.27* 1.27* 0.96 1.03* 1.59* 1.23* 0.89  -Discontinue IV sodium bicarbonate . -Strict intake and output -Recheck in the morning  Hyperkalemia/chronic hyponatremia: Hyperkalemia resolved.  Hyponatremia improved.  Baseline is about 128. -Monitor  Chronic COPD/chronic hypoxic RF: Reportedly stopped using home oxygen  about a week prior to coming to ED. currently saturating in upper 90s on 2 L. -Incentive spirometry, wean oxygen  as able and assess ambulatory saturation -Continue LABA/ICS  MDS/CMML/pancytopenia: Now with leukocytosis (monocytosis)., anemia and thrombocytopenia.  No report of overt bleeding.  She was transfused 1 unit on 2/2 and 2/5.  Hgb improved.  Likely due to underlying CMML. Recent Labs    12/03/23 1331 12/03/23 1757 12/04/23 0757 12/05/23 0325 12/05/23 1428 12/06/23 0323 12/07/23 0602 12/08/23 0710 12/08/23 1805 12/09/23 0723  HGB 9.5* 8.8* 7.3* 6.8* 7.9* 8.5* 7.6* 7.2* 8.8* 8.1*  -Monitor H&H -SCD for VTE prophylaxis -Discussed with patient's hematologist, Dr. Katragadda (see below)  Chronic diastolic CHF: TTE 07/2023 with LVEF of 60 to 65%, moderate LVH and G2-DD, severe LAE, mild to moderate MR and moderate to severe AS.  Patient denies cardiopulmonary symptoms but not a great historian.  She is on Lasix  at home.  Appears euvolemic on exam. -Continue holding diuretics. -Strict intake and output, daily weight, renal functions and electrolytes   Non-anion gap  metabolic acidosis: Likely due to AKI.  Resolved. -Discontinue sodium bicarbonate .  History of stage I breast cancer s/p  lumpectomy and XRT in 1999 -Tamoxifen  History of stage I left upper lobe lung cancer:  s/p left upper lobe lobectomy in 2006 -Outpatient follow-up.   Hypothyroidism -On Synthroid   Hypomagnesemia -Monitor replenish as appropriate  Physical deconditioning -PT/OT-recommended SNF.  TOC working on this.  Leukocytosis with monocytosis: Discussed with patient's hematologist, Dr. Rogers on 2/5.  Felt to be leukemoid reaction.  He recommended monitoring until WBC starts to trend down.  You -Continue monitoring  Dysphagia -Dysphagia 3 diet per SLP  Body mass index is 22.58 kg/m.  DVT prophylaxis Place and maintain sequential compression device Start: 12/08/23 1029 Place and maintain sequential compression device Start: 12/04/23 1556 SCDs Start: 12/03/23 1805  Code Status: Full code Family Communication: Updated patient's grandson, Leanor over the phone Level of care: Telemetry Medical Status is: Inpatient Remains inpatient appropriate because: SNF   Final disposition: SNF. Consultants:  Pulmonology Palliative medicine  55 minutes with more than 50% spent in reviewing records, counseling patient/family and coordinating care.   Sch Meds:  Scheduled Meds:  Chlorhexidine  Gluconate Cloth  6 each Topical Daily   estradiol   1 Applicatorful Vaginal QHS   feeding supplement (NEPRO CARB STEADY)  237 mL Oral BID  BM   fluticasone  furoate-vilanterol  1 puff Inhalation Daily   And   umeclidinium bromide   1 puff Inhalation Daily   metoprolol  tartrate  12.5 mg Oral BID   [START ON 12-22-23] multivitamin with minerals  1 tablet Oral Daily   OLANZapine   5 mg Oral QHS   pantoprazole   40 mg Oral Q0600   pravastatin   40 mg Oral QPM   sodium chloride  flush  3 mL Intravenous Q12H   traZODone   100 mg Oral QHS   Continuous Infusions:  sodium bicarbonate  75 mEq in  sodium chloride  0.45 % 1,075 mL infusion 75 mL/hr at 12/09/23 0440   PRN Meds:.acetaminophen , ondansetron  (ZOFRAN ) IV, [COMPLETED] polyethylene glycol **FOLLOWED BY** polyethylene glycol, QUEtiapine , [COMPLETED] senna-docusate **FOLLOWED BY** senna-docusate  Antimicrobials: Anti-infectives (From admission, onward)    Start     Dose/Rate Route Frequency Ordered Stop   12/04/23 1900  cefTRIAXone  (ROCEPHIN ) 2 g in sodium chloride  0.9 % 100 mL IVPB  Status:  Discontinued        2 g 200 mL/hr over 30 Minutes Intravenous Every 24 hours 12/04/23 0839 12/08/23 0756   12/03/23 1900  cefTRIAXone  (ROCEPHIN ) 1 g in sodium chloride  0.9 % 100 mL IVPB  Status:  Discontinued        1 g 200 mL/hr over 30 Minutes Intravenous Every 24 hours 12/03/23 1820 12/04/23 0839        I have personally reviewed the following labs and images: CBC: Recent Labs  Lab 12/05/23 0325 12/05/23 1428 12/06/23 0323 12/07/23 0602 12/08/23 0710 12/08/23 1805 12/09/23 0723  WBC 13.1*  --  10.2 21.1* 21.4*  --  17.7*  NEUTROABS  --   --   --  0.4*  --   --  0.4*  HGB 6.8*   < > 8.5* 7.6* 7.2* 8.8* 8.1*  HCT 23.0*   < > 28.1* 25.5* 24.0* 29.0* 25.2*  MCV 100.9*  --  98.3 98.8 97.6  --  93.7  PLT 38*  --  34* 45* 48*  --  51*   < > = values in this interval not displayed.   BMP &GFR Recent Labs  Lab 12/04/23 0758 12/04/23 1623 12/05/23 0321 12/05/23 0323 12/06/23 0323 12/06/23 1914 12/07/23 0602 12/08/23 0710 12/09/23 0723  NA  --   --  125* 125* 128* 128* 128* 127* 130*  K  --    < > 5.2* 5.3* 4.1 3.7 4.2 4.4 4.1  CL  --   --  98 99 99 95* 98 94* 96*  CO2  --   --  19* 18* 19* 22 18* 20* 25  GLUCOSE  --   --  126* 124* 95 117* 99 80 96  BUN  --   --  25* 25* 23 20 24* 25* 22  CREATININE  --   --  1.27* 1.27* 0.96 1.03* 1.59* 1.23* 0.89  CALCIUM   --   --  8.3* 8.3* 8.6* 8.3* 8.3* 7.8* 7.2*  MG 1.6*  --  2.0  --   --   --   --  1.8 1.6*  PHOS  --   --   --  4.0  --   --   --  1.7* 2.1*   < > = values in  this interval not displayed.   Estimated Creatinine Clearance: 43.3 mL/min (by C-G formula based on SCr of 0.89 mg/dL). Liver & Pancreas: Recent Labs  Lab 12/03/23 1325 12/03/23 2046 12/04/23 9242 12/05/23 9676 12/07/23 0602 12/08/23 0710  12/09/23 0723  AST 37 39 32  --  82*  --   --   ALT 12 11 10   --  20  --   --   ALKPHOS 42 39 34*  --  38  --   --   BILITOT 1.7* 1.2 1.3*  --  1.1  --   --   PROT 9.4* 9.3* 8.0  --  8.3*  --   --   ALBUMIN  3.2* 3.1* 2.7* 2.7* 2.9* 2.6* 2.3*   No results for input(s): LIPASE, AMYLASE in the last 168 hours. No results for input(s): AMMONIA in the last 168 hours. Diabetic: No results for input(s): HGBA1C in the last 72 hours. Recent Labs  Lab 12/03/23 1129 12/05/23 0620  GLUCAP 91 124*   Cardiac Enzymes: No results for input(s): CKTOTAL, CKMB, CKMBINDEX, TROPONINI in the last 168 hours. No results for input(s): PROBNP in the last 8760 hours. Coagulation Profile: No results for input(s): INR, PROTIME in the last 168 hours. Thyroid  Function Tests: No results for input(s): TSH, T4TOTAL, FREET4, T3FREE, THYROIDAB in the last 72 hours. Lipid Profile: No results for input(s): CHOL, HDL, LDLCALC, TRIG, CHOLHDL, LDLDIRECT in the last 72 hours. Anemia Panel: Recent Labs    12/08/23 0710  VITAMINB12 720  FOLATE 36.1  FERRITIN 1,465*  TIBC 171*  IRON 36  RETICCTPCT 4.6*   Urine analysis:    Component Value Date/Time   COLORURINE YELLOW 12/03/2023 1716   APPEARANCEUR HAZY (A) 12/03/2023 1716   APPEARANCEUR Clear 12/01/2023 0954   LABSPEC 1.011 12/03/2023 1716   PHURINE 5.0 12/03/2023 1716   GLUCOSEU NEGATIVE 12/03/2023 1716   HGBUR MODERATE (A) 12/03/2023 1716   BILIRUBINUR NEGATIVE 12/03/2023 1716   BILIRUBINUR Negative 12/01/2023 0954   KETONESUR NEGATIVE 12/03/2023 1716   PROTEINUR 100 (A) 12/03/2023 1716   NITRITE NEGATIVE 12/03/2023 1716   LEUKOCYTESUR LARGE (A) 12/03/2023 1716    Sepsis Labs: Invalid input(s): PROCALCITONIN, LACTICIDVEN  Microbiology: Recent Results (from the past 240 hours)  Microscopic Examination     Status: None   Collection Time: 12/01/23  9:54 AM   Urine  Result Value Ref Range Status   WBC, UA 0-5 0 - 5 /hpf Final   RBC, Urine 0-2 0 - 2 /hpf Final   Epithelial Cells (non renal) 0-10 0 - 10 /hpf Final   Bacteria, UA None seen None seen/Few Final  Culture, blood (routine x 2)     Status: None   Collection Time: 12/03/23  1:25 PM   Specimen: BLOOD  Result Value Ref Range Status   Specimen Description BLOOD RIGHT ANTECUBITAL  Final   Special Requests   Final    BOTTLES DRAWN AEROBIC AND ANAEROBIC Blood Culture results may not be optimal due to an inadequate volume of blood received in culture bottles   Culture   Final    NO GROWTH 5 DAYS Performed at [facility], [address], [place], [place] [address]    Report Status 12/08/2023 FINAL  Final  Culture, blood (routine x 2)     Status: None   Collection Time: 12/03/23  1:25 PM   Specimen: BLOOD  Result Value Ref Range Status   Specimen Description BLOOD RIGHT ANTECUBITAL  Final   Special Requests   Final    AEROBIC BOTTLE ONLY Blood Culture results may not be optimal due to an inadequate volume of blood received in culture bottles   Culture   Final    NO GROWTH 5 DAYS  Performed at [facility], [address], [place], [place] [address]    Report Status 12/08/2023 FINAL  Final  Urine Culture (for pregnant, neutropenic or urologic patients or patients with an indwelling urinary catheter)     Status: Abnormal   Collection Time: 12/03/23  6:08 PM   Specimen: Urine, Clean Catch  Result Value Ref Range Status   Specimen Description URINE, CLEAN CATCH  Final   Special Requests   Final    NONE Performed at [facility], [address] [place], [place] [address]    Culture (A)  Final    60,000 COLONIES/mL KLEBSIELLA PNEUMONIAE 10,000 COLONIES/mL YEAST     Report Status 12/06/2023 FINAL  Final   Organism ID, Bacteria KLEBSIELLA PNEUMONIAE (A)  Final      Susceptibility   Klebsiella pneumoniae - MIC*    AMPICILLIN RESISTANT Resistant     CEFAZOLIN <=4 SENSITIVE Sensitive     CEFEPIME  <=0.12 SENSITIVE Sensitive     CEFTRIAXONE  <=0.25 SENSITIVE Sensitive     CIPROFLOXACIN <=0.25 SENSITIVE Sensitive     GENTAMICIN <=1 SENSITIVE Sensitive     IMIPENEM <=0.25 SENSITIVE Sensitive     NITROFURANTOIN 64 INTERMEDIATE Intermediate     TRIMETH /SULFA  <=20 SENSITIVE Sensitive     AMPICILLIN/SULBACTAM <=2 SENSITIVE Sensitive     PIP/TAZO <=4 SENSITIVE Sensitive ug/mL    * 60,000 COLONIES/mL KLEBSIELLA PNEUMONIAE  MRSA Next Gen by PCR, Nasal     Status: None   Collection Time: 12/05/23  6:06 AM   Specimen: Nasal Mucosa; Nasal Swab  Result Value Ref Range Status   MRSA by PCR Next Gen NOT DETECTED NOT DETECTED Final    Comment: (NOTE) The GeneXpert MRSA Assay (FDA approved for NASAL specimens only), is one component of a comprehensive MRSA colonization surveillance program. It is not intended to diagnose MRSA infection nor to guide or monitor treatment for MRSA infections. Test performance is not FDA approved in patients less than 53 years old. Performed at [facility], [address], [place], [place] [address]     Radiology Studies: No results found.    Shandrika Ambers T. Ayuub Penley [facility]  If 7PM-7AM, please contact night-coverage [email or web address] 12/09/2023, 12:16 PM

## 2023-12-09 NOTE — TOC Progression Note (Signed)
 Transition of Care [facility]) - Progression Note    Patient Details  Name: Lisa [name] MRN: [id number] Date of Birth: 1934-09-07  Transition of [facility] CM/SW Contact  Karigan Cloninger A Kato Wieczorek, LCSW Phone Number: 12/09/2023, 3:12 PM  Clinical Narrative:     CSW reached out to pt's Durable power of attorney, grandson, Leanor. CSW discussed option for SNF, he stated that he did not want his grandmother to go to SNF, as they had a terrible experience in the past. Requested CSW reach out to CIR for possible review for placement, and if not viable reach out to other places in the area for CIR. Request made to CIR for review. Disposition plan for acute inpatient rehab. Provider notified.    TOC will continue to follow.        Expected Discharge Plan and Services                                               Social Determinants of Health (SDOH) Interventions SDOH Screenings   Food Insecurity: No Food Insecurity (12/04/2023)  Housing: Low Risk  (12/04/2023)  Transportation Needs: No Transportation Needs (12/04/2023)  Utilities: Not At Risk (12/04/2023)  Depression (PHQ2-9): Low Risk  (12/12/2020)  Physical Activity: Inactive (12/12/2020)  Social Connections: Unknown (12/04/2023)  Stress: No Stress Concern Present (08/27/2023)   Received from [facility]  Tobacco Use: Medium Risk (12/01/2023)    Readmission Risk Interventions    08/16/2023   11:37 AM 08/03/2023   11:15 AM 07/13/2023   11:15 AM  Readmission Risk Prevention Plan  Transportation Screening Complete Complete Complete  Medication Review [facility])  Referral to Pharmacy Complete  PCP or Specialist appointment within 3-5 days of discharge  Complete   HRI or Home Care Consult  Complete Complete  SW Recovery Care/Counseling Consult  Complete Complete  Palliative Care Screening  Not Applicable Not Applicable  Skilled Nursing Facility  Not Applicable Not Applicable

## 2023-12-09 NOTE — Progress Notes (Signed)
 Physical Therapy Treatment Patient Details Name: Lisa [name] MRN: [id number] DOB: 1934/02/28 Today's Date: 12/09/2023   History of Present Illness 89 y.o. female presents to [facility] hospital on 12/03/23 with AMS and UTI. 12/05/23 became hypotensive with ICU transfer;   PMH includes chronic HFpEF, HTN, CKD III, lung adenocarcinoma, breast cancer, hyperkalemia, anemia, gout, CML, moderate AS, AAA, NSTEMI, TIA, PAF.    PT Comments  Pt received in bed and agreeable to try to get up. Wants to eat her lunch. Mod A needed to come to EOB. Pt doing better balancing EOB in unsupported sitting. Pt relays that her feet hurt and she thinks this is new for her. Mod A +2 needed to stand with use of stedy. Pt performed sit>stand multiple times from bed and from flaps of stedy and worked on increasing tolerance for upright posture. Pt transferred to recliner on stedy and lift pad left under pt for use for back to bed as it will be harder for pt to stand from low surface of recliner. Patient will benefit from continued inpatient follow up therapy, <3 hours/day. PT will continue to follow.     If plan is discharge home, recommend the following: Two people to help with walking and/or transfers;Direct supervision/assist for medications management;Direct supervision/assist for financial management;Assist for transportation;Help with stairs or ramp for entrance;Supervision due to cognitive status   Can travel by private vehicle     No  Equipment Recommendations  None recommended by PT    Recommendations for Other Services       Precautions / Restrictions Precautions Precautions: Fall;Other (comment) Precaution Comments: HOH (L ear seems better), dementia Restrictions Weight Bearing Restrictions Per Provider Order: No     Mobility  Bed Mobility Overal bed mobility: Needs Assistance Bed Mobility: Supine to Sit     Supine to sit: Mod assist     General bed mobility comments: pt needed mod A for LE's to  EOB but was able to elevate trunk with min A. Mod A needed to scoot hips to EOB    Transfers Overall transfer level: Needs assistance Equipment used: Ambulation equipment used Transfers: Sit to/from Stand, Bed to chair/wheelchair/BSC Sit to Stand: Mod assist, +2 physical assistance           General transfer comment: pt did well with using pull bar of stedy for sit>stand. Needed mod A +2 for power up from bed and from flaps of stedy. Performed multiple times Transfer via Lift Equipment: Stedy  Ambulation/Gait               General Gait Details: unable to step feet in standing   Stairs             Wheelchair Mobility     Tilt Bed    Modified Rankin (Stroke Patients Only)       Balance Overall balance assessment: History of Falls, Needs assistance Sitting-balance support: Feet supported, No upper extremity supported Sitting balance-Leahy Scale: Fair Sitting balance - Comments: sitting EOB   Standing balance support: Bilateral upper extremity supported Standing balance-Leahy Scale: Zero Standing balance comment: needs UE support and external assist to maintain standing                            Cognition Arousal: Alert Behavior During Therapy: WFL for tasks assessed/performed Overall Cognitive Status: History of cognitive impairments - at baseline  General Comments: Oriented to self and place. Able to hold current time period conversation and relay needs        Exercises General Exercises - Lower Extremity Ankle Circles/Pumps: AROM, Both, 10 reps, Supine    General Comments General comments (skin integrity, edema, etc.): VSS. Worked on increasing tolerance for upright posture      Pertinent Vitals/Pain Pain Assessment Pain Assessment: Faces Faces Pain Scale: Hurts little more Pain Location: B feet Pain Descriptors / Indicators: Aching Pain Intervention(s): Limited activity within  patient's tolerance, Monitored during session    Home Living                          Prior Function            PT Goals (current goals can now be found in the care plan section) Acute Rehab PT Goals Patient Stated Goal: go home PT Goal Formulation: Patient unable to participate in goal setting Time For Goal Achievement: 12/21/23 Potential to Achieve Goals: Fair Progress towards PT goals: Progressing toward goals    Frequency    Min 1X/week      PT Plan      Co-evaluation              AM-PAC PT 6 Clicks Mobility   Outcome Measure  Help needed turning from your back to your side while in a flat bed without using bedrails?: A Lot Help needed moving from lying on your back to sitting on the side of a flat bed without using bedrails?: Total Help needed moving to and from a bed to a chair (including a wheelchair)?: Total Help needed standing up from a chair using your arms (e.g., wheelchair or bedside chair)?: Total Help needed to walk in hospital room?: Total Help needed climbing 3-5 steps with a railing? : Total 6 Click Score: 7    End of Session Equipment Utilized During Treatment: Gait belt;Oxygen  Activity Tolerance: Patient limited by fatigue Patient left: with call bell/phone within reach;in chair;Other (comment) (lift pad under pt) Nurse Communication: Mobility status;Need for lift equipment PT Visit Diagnosis: Muscle weakness (generalized) (M62.81);Difficulty in walking, not elsewhere classified (R26.2)     Time: 8845-8774 PT Time Calculation (min) (ACUTE ONLY): 31 min  Charges:    $Therapeutic Activity: 23-37 mins PT General Charges $$ ACUTE PT VISIT: 1 Visit                     Richerd Lipoma, PT  Acute Rehab Services Secure chat preferred Office [phone]    Richerd CROME Sharnette Kitamura 12/09/2023, 2:45 PM

## 2023-12-09 NOTE — Progress Notes (Signed)
 Inpatient Rehab Admissions Coordinator:   Per family request pt was screened for CIR by Reche Lowers, PT, DPT.  Pt is familiar to this [facility] from previous referrals to CIR, most recently in October of 2024.  Pt appears to meet criteria for an inpatient rehab admission, so I will place an order for an [facility] to complete full assessment tomorrow.  I advised Kiva Jordan, LCSW that we will not have any beds available until sometime next week.  Pt has been to [facility] in the last six months and may be agreeable to referral there as well.    Reche Lowers, PT, DPT Admissions Coordinator [phone] 12/09/23  3:48 PM

## 2023-12-09 NOTE — Plan of Care (Signed)

## 2023-12-09 NOTE — Progress Notes (Addendum)
 Mobility Specialist Progress Note:   12/09/23 1528  Mobility  Activity Transferred from chair to bed  Level of Assistance Moderate assist, patient does 50-74% (+2)  Assistive Device Stedy  Activity Response Tolerated well  Mobility Referral Yes  Mobility visit 1 Mobility  Mobility Specialist Start Time (ACUTE ONLY) 1445  Mobility Specialist Stop Time (ACUTE ONLY) 1500  Mobility Specialist Time Calculation (min) (ACUTE ONLY) 15 min   Pt received in bed agreeable to mobility. Pt required ModA +2 to stand w/ the stedy. No c/o throughout. Situated back in bed w/ call bell and personal belongings in reach. All needs met. Bed alarm on.  Thersia Minder Mobility Specialist  Please contact vis Secure Chat or  Rehab Office [phone]

## 2023-12-10 ENCOUNTER — Inpatient Hospital Stay (HOSPITAL_COMMUNITY): Payer: Medicare Other | Admitting: Anesthesiology

## 2023-12-10 DIAGNOSIS — J9611 Chronic respiratory failure with hypoxia: Secondary | ICD-10-CM | POA: Diagnosis not present

## 2023-12-10 DIAGNOSIS — J9622 Acute and chronic respiratory failure with hypercapnia: Secondary | ICD-10-CM | POA: Diagnosis not present

## 2023-12-10 DIAGNOSIS — N1831 Chronic kidney disease, stage 3a: Secondary | ICD-10-CM | POA: Diagnosis not present

## 2023-12-10 DIAGNOSIS — C3492 Malignant neoplasm of unspecified part of left bronchus or lung: Secondary | ICD-10-CM | POA: Diagnosis not present

## 2023-12-10 DIAGNOSIS — D469 Myelodysplastic syndrome, unspecified: Secondary | ICD-10-CM | POA: Diagnosis not present

## 2023-12-10 DIAGNOSIS — J449 Chronic obstructive pulmonary disease, unspecified: Secondary | ICD-10-CM | POA: Diagnosis not present

## 2023-12-10 DIAGNOSIS — Z66 Do not resuscitate: Secondary | ICD-10-CM

## 2023-12-10 DIAGNOSIS — I469 Cardiac arrest, cause unspecified: Secondary | ICD-10-CM

## 2023-12-10 DIAGNOSIS — J9621 Acute and chronic respiratory failure with hypoxia: Secondary | ICD-10-CM

## 2023-12-10 DIAGNOSIS — N179 Acute kidney failure, unspecified: Secondary | ICD-10-CM

## 2023-12-10 DIAGNOSIS — E8721 Acute metabolic acidosis: Secondary | ICD-10-CM

## 2023-12-10 DIAGNOSIS — Z7189 Other specified counseling: Secondary | ICD-10-CM

## 2023-12-10 DIAGNOSIS — G9340 Encephalopathy, unspecified: Secondary | ICD-10-CM | POA: Diagnosis not present

## 2023-12-10 LAB — POCT I-STAT 7, (LYTES, BLD GAS, ICA,H+H)
Acid-base deficit: 12 mmol/L — ABNORMAL HIGH (ref 0.0–2.0)
Bicarbonate: 17.1 mmol/L — ABNORMAL LOW (ref 20.0–28.0)
Calcium, Ion: 1.48 mmol/L — ABNORMAL HIGH (ref 1.15–1.40)
HCT: 22 % — ABNORMAL LOW (ref 36.0–46.0)
Hemoglobin: 7.5 g/dL — ABNORMAL LOW (ref 12.0–15.0)
O2 Saturation: 85 %
Patient temperature: 98
Potassium: 5.1 mmol/L (ref 3.5–5.1)
Sodium: 128 mmol/L — ABNORMAL LOW (ref 135–145)
TCO2: 19 mmol/L — ABNORMAL LOW (ref 22–32)
pCO2 arterial: 52.4 mm[Hg] — ABNORMAL HIGH (ref 32–48)
pH, Arterial: 7.12 — CL (ref 7.35–7.45)
pO2, Arterial: 66 mm[Hg] — ABNORMAL LOW (ref 83–108)

## 2023-12-10 LAB — CBC
HCT: 25.5 % — ABNORMAL LOW (ref 36.0–46.0)
Hemoglobin: 8 g/dL — ABNORMAL LOW (ref 12.0–15.0)
MCH: 29.6 pg (ref 26.0–34.0)
MCHC: 31.4 g/dL (ref 30.0–36.0)
MCV: 94.4 fL (ref 80.0–100.0)
Platelets: 53 10*3/uL — ABNORMAL LOW (ref 150–400)
RBC: 2.7 MIL/uL — ABNORMAL LOW (ref 3.87–5.11)
RDW: 18.9 % — ABNORMAL HIGH (ref 11.5–15.5)
WBC: 23.6 10*3/uL — ABNORMAL HIGH (ref 4.0–10.5)
nRBC: 5.9 % — ABNORMAL HIGH (ref 0.0–0.2)

## 2023-12-10 LAB — RENAL FUNCTION PANEL
Albumin: 2.3 g/dL — ABNORMAL LOW (ref 3.5–5.0)
Anion gap: 9 (ref 5–15)
BUN: 28 mg/dL — ABNORMAL HIGH (ref 8–23)
CO2: 25 mmol/L (ref 22–32)
Calcium: 7.3 mg/dL — ABNORMAL LOW (ref 8.9–10.3)
Chloride: 91 mmol/L — ABNORMAL LOW (ref 98–111)
Creatinine, Ser: 1.49 mg/dL — ABNORMAL HIGH (ref 0.44–1.00)
GFR, Estimated: 34 mL/min — ABNORMAL LOW (ref >60)
Glucose, Bld: 88 mg/dL (ref 70–99)
Phosphorus: 2 mg/dL — ABNORMAL LOW (ref 2.5–4.6)
Potassium: 5.1 mmol/L (ref 3.5–5.1)
Sodium: 125 mmol/L — ABNORMAL LOW (ref 135–145)

## 2023-12-10 LAB — GLUCOSE, CAPILLARY: Glucose-Capillary: 42 mg/dL — CL (ref 70–99)

## 2023-12-10 LAB — MAGNESIUM: Magnesium: 2.2 mg/dL (ref 1.7–2.4)

## 2023-12-10 MED ORDER — DEXTROSE 50 % IV SOLN
25.0000 g | INTRAVENOUS | Status: AC
  Administered 2023-12-10: 25 g via INTRAVENOUS

## 2023-12-10 MED ORDER — SODIUM CHLORIDE 0.9 % IV BOLUS
250.0000 mL | INTRAVENOUS | Status: DC
  Administered 2023-12-10: 250 mL via INTRAVENOUS

## 2023-12-10 MED ORDER — MAGNESIUM SULFATE 2 GM/50ML IV SOLN
INTRAVENOUS | Status: AC
  Administered 2023-12-10: 2 g
  Filled 2023-12-10: qty 50

## 2023-12-13 ENCOUNTER — Inpatient Hospital Stay: Payer: Medicare Other

## 2023-12-27 ENCOUNTER — Institutional Professional Consult (permissible substitution) (INDEPENDENT_AMBULATORY_CARE_PROVIDER_SITE_OTHER): Payer: Medicare Other

## 2023-12-27 ENCOUNTER — Ambulatory Visit (INDEPENDENT_AMBULATORY_CARE_PROVIDER_SITE_OTHER): Payer: Medicare Other | Admitting: Audiology

## 2024-01-01 NOTE — Anesthesia Procedure Notes (Signed)
 Procedure Name: Intubation Date/Time: 12-31-23 2:36 PM  Performed by: Vertie Arthea RAMAN, CRNAPre-anesthesia Checklist: Patient identified, Emergency Drugs available, Suction available and Patient being monitored Patient Re-evaluated:Patient Re-evaluated prior to induction Oxygen  Delivery Method: Circle system utilized Preoxygenation: Pre-oxygenation with 100% oxygen  Ventilation: Mask ventilation without difficulty Laryngoscope Size: Mac and 4 Grade View: Grade I Tube type: Oral Tube size: 7.5 mm Number of attempts: 1 Airway Equipment and Method: Stylet Placement Confirmation: ETT inserted through vocal cords under direct vision, positive ETCO2 and breath sounds checked- equal and bilateral Secured at: 22 cm Tube secured with: Tape Dental Injury: Teeth and Oropharynx as per pre-operative assessment

## 2024-01-01 NOTE — Progress Notes (Signed)
 Upon arrival to 30M pt went into PEA, CPR initiated.

## 2024-01-01 NOTE — Progress Notes (Signed)
 Inpatient Rehab Admissions Coordinator:  Consult received. Discussed pt with Dr. Babs. Dr. Babs familiar with pt from previous admit. Reviewed expectations of goals that Dr. Babs states 1) pt needs to have increased tolerance of therapies and 2) therapy needs to recommend CIR as discharge recommendation. Daughter acknowledged understanding and requested AC contact pt's grandson Leanor. Left a message on [employer] incorporated. Awaiting return call. TOC made aware.    Tinnie Yvone Cohens, MS, CCC-SLP Admissions Coordinator [phone]

## 2024-01-01 NOTE — Progress Notes (Addendum)
 NAME:  Lisa [name], MRN:  [id number], DOB:  [birth year], LOS: 6 ADMISSION DATE:  12/03/2023, CONSULTATION DATE: 12/05/2023 REFERRING MD: Lisa [name], CHIEF COMPLAINT: Hypotension, sepsis  History of Present Illness:  89 year old lady being treated for metabolic encephalopathy likely secondary to urinary tract infection with multiple comorbidities who is noted to be hypotensive, remains hypotensive despite ongoing interventions including fluid resuscitation, midodrine , albumin  infusion.  Multiple underlying comorbidities including myelodysplastic syndrome, past history of adenocarcinoma of the left lung, invasive ductal carcinoma of the left breast which is being treated, stage III chronic kidney disease, paroxysmal atrial fibrillation, chronic diastolic heart failure, history of chronic myelomonocytic leukemia, chronic obstructive pulmonary disease, chronic respiratory failure  Notably was more alert, oriented on 2 /1 and feeling better than when she was admitted the previous day.  Goals of care discussions ongoing, palliative care has been consulted   On December 22, 2023 the patient had a PEA arrest, with ROSC achieved after about 10 minutes. She was transferred to the ICU, intubated. In the ICU she subsequently had another arrest.   PCCM is re-engaged in this setting   Pertinent  Medical History   Past Medical History:  Diagnosis Date   AAA (abdominal aortic aneurysm) (HCC)    Adenocarcinoma of left lung (HCC) 2006   AF (paroxysmal atrial fibrillation) (HCC)    Aortic stenosis    Arthritis    Asthma    Cancer of breast, female (HCC)    Cancer of lung (HCC)    Dysrhythmia    Hypertension    Hypothyroidism    Invasive ductal carcinoma of left breast (HCC) 1999   Mitral regurgitation    Myocardial infarction (HCC)    Neutropenia (HCC) 06/09/2016   NSTEMI (non-ST elevated myocardial infarction) (HCC) 04/06/2023   Personal history of radiation therapy    Pulmonary hypertension (HCC)    TIA  (transient ischemic attack) 02/10/2022     Significant Hospital Events: Including procedures, antibiotic start and stop dates in addition to other pertinent events   Admitted 12/04/2023 CT head 12/03/2023-no acute abnormality Chest x-ray 12/03/2023 no acute infiltrate, cardiomegaly, small effusions PCCM consult for persistent hypotension 2/2. Quickly weaned off pressors and is awaiting transfer out of ICU 2/3 still awaiting transfer out of ICU. Dc remeron . SLP consult  2/4 out of ICU  22-Dec-2023 back to ICU following cardiac arrest   Interim History / Subjective:  ROSC after second arrest   Objective   Blood pressure 98/70, pulse 87, temperature 98 F (36.7 C), temperature source Oral, resp. rate (!) 27, height 5' 7 (1.702 m), weight 65.2 kg, SpO2 99%.    Vent Mode: PRVC FiO2 (%):  [100 %] 100 % Set Rate:  [20 bmp] 20 bmp Vt Set:  [490 mL] 490 mL PEEP:  [5 cmH20] 5 cmH20   Intake/Output Summary (Last 24 hours) at 12/22/2023 1532 Last data filed at 2023-12-22 0854 Gross per 24 hour  Intake 420 ml  Output 100 ml  Net 320 ml   Filed Weights   12/08/23 0500 12/09/23 0706 2023/12/22 0601  Weight: 63.2 kg 65.4 kg 65.2 kg    Examination: General:  Frail and critically ill elderly F intubated  HENT: ETTsecure  Lungs: Mechanically ventilated with superimposed agonal respiration patterns  Cardiovascular: bradycardic  Abdomen: soft ndnt  Extremities: Lower extremity IO  Neuro: Unresponsive GU: defer   Resolved Hospital Problem list     Assessment & Plan:    Cardiac arrest, x2   Goals of care DNR Encounter for  palliative care Acute on chronic respiratory failure with hypoxia and hypercarbia  COPD  Acute encephalopathy superimposed on baseline dementia  UTI AKI  Metabolic acidosis  Hyponatremia Leukocytosis  Hx pancytopenia  CMML - MDS  AoC Anemia  AoC thrombocytopenia Chronic diastolic HF Moderate Aortic stenosis Hx HTN Hx NSTEMI  Hx breast ca s/p  lumpectomy Hypothyroidism  P -Pt w 10 min arrest on the floor prompting intubation and ICU transfer. On arrival to ICU sustained a second PEA arrest.  -After second arrest and ROSC, I called the POA Lisa [name] (grandson). He indicates that they have spoken about DNR status, and her code status should be changed to DNR at this time. We talked also about GOC from here --  plan of was to compassionately transition to comfort care when pt daughter arrives to bedside. In accordance with this, labs, imaging were dc. MV to continue. I spoke w patient's daughter on the phone to convey this difficult update and convey the plan of care & also talked about the possibility that Lisa [name]'s heart may stop again prior to family's arrival, despite continuing MV support. She voiced understanding.  -Unfortunately, prior to the daughter's arrival the patient become progressively bradycardic and subsequently went into PEA.  -Time of death declared at 4. Daughter and Lisa [name]) informed. Expect Daughter to arrive to bedside this afternoon. Will remove ETT prior to her arrival.       Best Practice (right click and Reselect all SmartList Selections daily)   Diet/type: NPO DVT prophylaxis na  Pressure ulcer(s): N/A GI prophylaxis: N/A Lines: N/A Foley:  N/A Code Status:  DNR Last date of multidisciplinary goals of care discussion [grandson updated 01-02-24  Labs   CBC: Recent Labs  Lab 12/06/23 0323 12/07/23 0602 12/08/23 0710 12/08/23 1805 12/09/23 0723 2024/01/02 0701 01-02-24 1511  WBC 10.2 21.1* 21.4*  --  17.7* 23.6*  --   NEUTROABS  --  0.4*  --   --  0.4*  --   --   HGB 8.5* 7.6* 7.2* 8.8* 8.1* 8.0* 7.5*  HCT 28.1* 25.5* 24.0* 29.0* 25.2* 25.5* 22.0*  MCV 98.3 98.8 97.6  --  93.7 94.4  --   PLT 34* 45* 48*  --  51* 53*  --     Basic Metabolic Panel: Recent Labs  Lab 12/04/23 0758 12/04/23 1623 12/05/23 0321 12/05/23 0323 12/06/23 0323 12/06/23 1914 12/07/23 0602 12/08/23 0710  12/09/23 0723 2024/01/02 0701 01-02-2024 1511  NA  --   --  125* 125*   < > 128* 128* 127* 130* 125* 128*  K  --    < > 5.2* 5.3*   < > 3.7 4.2 4.4 4.1 5.1 5.1  CL  --   --  98 99   < > 95* 98 94* 96* 91*  --   CO2  --   --  19* 18*   < > 22 18* 20* 25 25  --   GLUCOSE  --   --  126* 124*   < > 117* 99 80 96 88  --   BUN  --   --  25* 25*   < > 20 24* 25* 22 28*  --   CREATININE  --   --  1.27* 1.27*   < > 1.03* 1.59* 1.23* 0.89 1.49*  --   CALCIUM   --   --  8.3* 8.3*   < > 8.3* 8.3* 7.8* 7.2* 7.3*  --   MG 1.6*  --  2.0  --   --   --   --  1.8 1.6* 2.2  --   PHOS  --   --   --  4.0  --   --   --  1.7* 2.1* 2.0*  --    < > = values in this interval not displayed.   GFR: Estimated Creatinine Clearance: 25.9 mL/min (A) (by C-G formula based on SCr of 1.49 mg/dL (H)). Recent Labs  Lab 12/05/23 0853 12/05/23 1236 12/06/23 0323 12/07/23 0602 12/08/23 0710 12/09/23 0723 22-Dec-2023 0701  WBC  --   --    < > 21.1* 21.4* 17.7* 23.6*  LATICACIDVEN 2.9* 1.3  --   --   --   --   --    < > = values in this interval not displayed.    Liver Function Tests: Recent Labs  Lab 12/03/23 2046 12/04/23 0757 12/05/23 0323 12/07/23 0602 12/08/23 0710 12/09/23 0723 22-Dec-2023 0701  AST 39 32  --  82*  --   --   --   ALT 11 10  --  20  --   --   --   ALKPHOS 39 34*  --  38  --   --   --   BILITOT 1.2 1.3*  --  1.1  --   --   --   PROT 9.3* 8.0  --  8.3*  --   --   --   ALBUMIN  3.1* 2.7* 2.7* 2.9* 2.6* 2.3* 2.3*   No results for input(s): LIPASE, AMYLASE in the last 168 hours. No results for input(s): AMMONIA in the last 168 hours.  ABG    Component Value Date/Time   PHART 7.120 (LL) 12-22-2023 1511   PCO2ART 52.4 (H) 2023-12-22 1511   PO2ART 66 (L) Dec 22, 2023 1511   HCO3 17.1 (L) 12/22/2023 1511   TCO2 19 (L) 12/22/2023 1511   ACIDBASEDEF 12.0 (H) 12-22-2023 1511   O2SAT 85 2023/12/22 1511     Coagulation Profile: No results for input(s): INR, PROTIME in the last 168  hours.  Cardiac Enzymes: No results for input(s): CKTOTAL, CKMB, CKMBINDEX, TROPONINI in the last 168 hours.  HbA1C: Hgb A1c MFr Bld  Date/Time Value Ref Range Status  02/11/2022 05:05 AM 4.4 (L) 4.8 - 5.6 % Final    Comment:    (NOTE) Pre diabetes:          5.7%-6.4%  Diabetes:              >6.4%  Glycemic control for   <7.0% adults with diabetes     CBG: Recent Labs  Lab 12/05/23 0620 2023-12-22 1456  GLUCAP 124* 42*     CRITICAL CARE Performed by: Ronnald FORBES Gave   Total critical care time: 38 minutes  Critical care time was exclusive of separately billable procedures and treating other patients. Critical care was necessary to treat or prevent imminent or life-threatening deterioration.  Critical care was time spent personally by me on the following activities: development of treatment plan with patient and/or surrogate as well as nursing, discussions with consultants, evaluation of patient's response to treatment, examination of patient, obtaining history from patient or surrogate, ordering and performing treatments and interventions, ordering and review of laboratory studies, ordering and review of radiographic studies, pulse oximetry and re-evaluation of patient's condition.  Ronnald Gave MSN, AGACNP-BC [facility] Amion for pager  2023-12-22, 3:32 PM

## 2024-01-01 NOTE — Progress Notes (Addendum)
 Occupational Therapy Treatment Patient Details Name: Lisa [name] MRN: [id number] DOB: 09-Jan-1934 Today's Date: 12/25/23   History of present illness 89 y.o. female presents to [facility] on 12/03/23 with AMS and UTI. 12/05/23 became hypotensive with ICU transfer;   PMH includes chronic HFpEF, HTN, CKD III, lung adenocarcinoma, breast cancer, hyperkalemia, anemia, gout, CML, moderate AS, AAA, NSTEMI, TIA, PAF.   OT comments  Patient seen this date with session limited as patient was already back in bed despite OT speaking with family and patient that OT would be returning  in order to complete a note for insurance. Patient completing bed level exercises and repositioning due to fatigue. Patient remains a mod A of 2 for transfers and requires increased assist for all ADL management (mod A). It is this OT's recommendation that patient is in need of lesser intensive therapy < 3 hours due to current level and participation in therapy. OT will continue to assess in hopes of increased participation for intensive therapy. OT will continue to follow.      If plan is discharge home, recommend the following:  Two people to help with walking and/or transfers;Two people to help with bathing/dressing/bathroom;Help with stairs or ramp for entrance;Assistance with cooking/housework;Assist for transportation;Assistance with feeding   Equipment Recommendations  Other (comment) (defer to next venue)    Recommendations for Other Services      Precautions / Restrictions Precautions Precautions: Fall;Other (comment) Precaution Comments: HOH (L ear seems better), dementia Restrictions Weight Bearing Restrictions Per Provider Order: No       Mobility Bed Mobility Overal bed mobility: Needs Assistance             General bed mobility comments: bed level exercises completed, requiring mod A for trunk and BLEs for bed level positioning    Transfers Overall transfer level: Needs assistance                  General transfer comment: Patient back in bed and fatigued     Balance Overall balance assessment: History of Falls, Needs assistance Sitting-balance support: Feet supported, No upper extremity supported Sitting balance-Leahy Scale: Fair Sitting balance - Comments: sitting EOB   Standing balance support: Bilateral upper extremity supported Standing balance-Leahy Scale: Zero Standing balance comment: needs UE support and external assist to maintain standing                           ADL either performed or assessed with clinical judgement   ADL Overall ADL's : Needs assistance/impaired Eating/Feeding: Set up;Sitting   Grooming: Set up;Bed level                               Functional mobility during ADLs: Moderate assistance;+2 for safety/equipment;+2 for physical assistance;Cueing for safety;Cueing for sequencing General ADL Comments: Patient seen this date with session limited as patient was already back in bed despite OT speaking with family and patient that OT would be returning  in order to complete a note for insurance. Patient completing bed level exercises and repositioning due to fatigue. Patient remains a mod A of 2 for transfers and requires increased assist for all ADL management. OT will continue to follow.    Extremity/Trunk Assessment Upper Extremity Assessment Upper Extremity Assessment: Generalized weakness            Vision       Perception     Praxis  Cognition Arousal: Alert Behavior During Therapy: WFL for tasks assessed/performed Overall Cognitive Status: History of cognitive impairments - at baseline                                 General Comments: Hard of hearing, oriented to self and place, able to recall her daughter was recently visiting        Exercises Exercises: Low Level/ICU Low Level/ICU Exercises Quad Sets: Both, 5 reps, Supine    Shoulder Instructions       General  Comments      Pertinent Vitals/ Pain       Pain Assessment Pain Assessment: No/denies pain  Home Living                                          Prior Functioning/Environment              Frequency  Min 1X/week        Progress Toward Goals  OT Goals(current goals can now be found in the care plan section)  Progress towards OT goals: Progressing toward goals (minimally)  Acute Rehab OT Goals Patient Stated Goal: did not state OT Goal Formulation: Patient unable to participate in goal setting Time For Goal Achievement: 12/21/23 Potential to Achieve Goals: Fair  Plan      Co-evaluation                 AM-PAC OT 6 Clicks Daily Activity     Outcome Measure   Help from another person eating meals?: A Little Help from another person taking care of personal grooming?: A Little Help from another person toileting, which includes using toliet, bedpan, or urinal?: Total Help from another person bathing (including washing, rinsing, drying)?: A Lot Help from another person to put on and taking off regular upper body clothing?: A Lot Help from another person to put on and taking off regular lower body clothing?: Total 6 Click Score: 12    End of Session    OT Visit Diagnosis: Unsteadiness on feet (R26.81);History of falling (Z91.81);Muscle weakness (generalized) (M62.81)   Activity Tolerance Patient limited by lethargy;Patient limited by fatigue   Patient Left in bed;with call bell/phone within reach;with bed alarm set   Nurse Communication Mobility status        Time: 1352-1406 OT Time Calculation (min): 14 min  Charges: OT General Charges $OT Visit: 1 Visit OT Treatments $Self Care/Home Management : 8-22 mins  Ronal Gift E. Javon Snee, OTR/L Acute Rehabilitation Services [phone]   Ronal Gift Salt 12-21-23, 2:50 PM

## 2024-01-01 NOTE — Progress Notes (Signed)
 Nutrition Follow-up  DOCUMENTATION CODES:   Severe malnutrition in context of chronic illness  INTERVENTION:  Magic cup TID with meals, each supplement provides 290 kcal and 9 grams of protein Nepro Shake po BID, each supplement provides 425 kcal and 19 grams protein Continue dysphagia 3 diet per SLP recommendations MVI with minerals daily Encourage po intake,   NUTRITION DIAGNOSIS:   Severe Malnutrition related to chronic illness as evidenced by severe muscle depletion, severe fat depletion.    GOAL:   Patient will meet greater than or equal to 90% of their needs    MONITOR:   PO intake, Weight trends, Supplement acceptance  REASON FOR ASSESSMENT:   Malnutrition Screening Tool    ASSESSMENT:   Pt presenting with worsening mental status; admitted for acute encephalopathy. Pt with PMH of CKD 3a, gout, HTN, hypothyroidism, paroxysmal atrial fibrillation, hyperkalemia, anemia, diastolic CHF, breast & lung cancer, COPD, chronic respiratory failure with hypoxia, AAA. Patient poor historian.  Unable to relay meal intake or nutritional history prior to admission.  She does no know if she has had a BM since admitting to hospital.Meal intake poor, although states she is drinking her Nepro.   Hospital weight history: 12/31/23 0601 65.2 kg 143.74 lbs  12/09/23 0706 65.4 kg 144.18 lbs  12/08/23 0500 63.2 kg 139.33 lbs  12/06/23 0515 64.1 kg 141.32 lbs  12/05/23 0613 59.6 kg 131.39 lbs  12/03/23 15:37:31 59.9 kg 132 lbs      Average Meal Intake: 25% average intake  Nutritionally Relevant Medications: Scheduled Meds:  feeding supplement (NEPRO CARB STEADY)  237 mL Oral BID BM   metoprolol  tartrate  12.5 mg Oral BID   mirtazapine   7.5 mg Oral QHS   multivitamin with minerals  1 tablet Oral Daily    Labs Reviewed:   Component Ref Range & Units (hover) 07:01 (Dec 31, 2023) 1 d ago (12/09/23) 2 d ago (12/08/23) 3 d ago (12/07/23) 4 d ago (12/06/23) 4 d ago (12/06/23) 5 d  ago (12/05/23)  Sodium 125 Low  130 Low  127 Low  128 Low  128 Low  128 Low  125 Low   Potassium 5.1 4.1 4.4 4.2 3.7 4.1 5.3 High     NUTRITION - FOCUSED PHYSICAL EXAM:  Flowsheet Row Most Recent Value  Orbital Region Severe depletion  Upper Arm Region Severe depletion  Thoracic and Lumbar Region Severe depletion  Buccal Region Severe depletion  Temple Region Severe depletion  Clavicle Bone Region Severe depletion  Clavicle and Acromion Bone Region Severe depletion  Scapular Bone Region Severe depletion  Dorsal Hand Severe depletion  Patellar Region Severe depletion  Anterior Thigh Region Severe depletion  Posterior Calf Region Moderate depletion  Edema (RD Assessment) None  Hair Reviewed  Eyes Reviewed  Mouth Reviewed  Skin Reviewed  Nails Reviewed       Diet Order:   Diet Order             DIET DYS 3 Room service appropriate? Yes with Assist; Fluid consistency: Thin  Diet effective now                   EDUCATION NEEDS:   Education needs have been addressed  Skin:  Skin Assessment: Reviewed RN Assessment  Last BM:     Height:   Ht Readings from Last 1 Encounters:  12/03/23 5' 7 (1.702 m)    Weight:   Wt Readings from Last 1 Encounters:  2023-12-31 65.2 kg    Ideal Body Weight:  61.4 kg  BMI:  Body mass index is 22.51 kg/m.  Estimated Nutritional Needs:   Kcal:  1600-1800  Protein:  80-95 g  Fluid:  1.6-1.8 L    Jenna Pew RDN, LDN Clinical Dietitian   If unable to reach, please contact RD Inpatient secure chat group between 8 am-4 pm daily

## 2024-01-01 NOTE — Progress Notes (Signed)
 Received pt from 2W in [place]. PEA at 1452 CPR started. ROSC at 1454.See code sheet. MD spoke to family code status changed. Pt expired at 1521 pronounced by 2 RN.

## 2024-01-01 NOTE — Death Summary Note (Signed)
DEATH SUMMARY   Patient Details  Name: Lisa [name] MRN: [id number] DOB: [birth year] ERE:Yjoo, Norleen PEDLAR, MD Admission/Discharge Information   Admit Date:  December 22, 2023  Date of Death: Date of Death: Dec 29, 2023  Time of Death: Time of Death: 10-28-1520  Length of Stay: 6   Principle Cause of death: Cardiac arrest    Hospital Course: No notes on file89 year old F with PMH of dementia, COPD, chronic hypoxic RF, MDS, pancytopenia, paroxysmal A-fib, diastolic CHF, AAA, chronic hyponatremia, left lung cancer, left breast cancer s/p lumpectomy, CKD-3A, recent UTI and moderate aortic stenosis brought to ED due to altered mental status, decreased appetite/p.o. intake.  Reportedly not wearing oxygen  for about a week.  She was admitted with working diagnosis of acute metabolic encephalopathy, AKI, kalemia, hyponatremia urinary tract infection.  Vitals stable.  K6.3.Cr 1.67 (baseline 1.0).  Protein 9.4.  Albumin  3.2.  Na 129 (baseline).  Hgb 8.2.  UA concerning for UTI.  VBG reassuring.  CT head without acute finding.  Cultures obtained.  Patient was started on IV ceftriaxone , IV fluid and electrolyte replenishment.   Patient had persistent hypotension concerning for septic shock.  He was transferred to ICU on 2/2 and started on vasopressors.  Blood cultures NGTD.  Urine culture with 60,000 colonies of Klebsiella pneumonia and 10,000 colonies of yeast.  Eventually, she came off vasopressor.  She was transferred to regular floor on 2/3, and TRH assumed care on 2/4.  Palliative medicine consulted.  Remained full code with full scope of care.   Patient improved clinically and was waiting on inpatient rehab placement.  She became bradycardic to 29 after working with OT and went into cardiac arrest on December 29, 2023 at about 2:29 PM. CODE BLUE activated.  ROSC achieved after 7 minutes of CPR, defibrillation x 1, epinephrine , IV magnesium , calcium  and sodium bicarbonate .  She was transferred to ICU.  She became bradycardic and  coded again.  After goal of care discussion with patient's HPOA, she was made DNR and transitioned to full comfort care.  Eventually, she passed away at 3:21 PM.   Assessment and Plan: Bradycardia/cardiac arrest x 2.  See above  Acute metabolic encephalopathy: Multifactorial including underlying dementia, urinary tract infection, hyponatremia, dehydration and polypharmacy.  Her UA and urine culture concerning for UTI.  VBG is reassuring.  CT head without acute finding.  She is on Zyprexa , trazodone  and Remeron  which could put her at risk of polypharmacy.  She has no focal neurodeficit other than symmetric BLE weakness.  Encephalopathy resolved.  -Resumed low-dose Remeron , changed trazodone  to as needed and discontinued Seroquel  after talking with patient's grandson, Leanor who is HCPOA on 2/6.  Continuing Zyprexa  -Avoid benzodiazepines    Paroxysmal atrial fibrillation with mild RVR: RVR resolved after resuming metoprolol .   -Continue metoprolol  12.5 mg twice daily   Urinary tract infection: Presents with altered mental status, poor p.o. intake and dehydration.  UA consistent with UTI.  Urine culture as above.  Completed ceftriaxone  from 1/31-2/5.   AKI on CKD-3A: Likely due to dehydration in the setting of poor p.o. intake.  B/l Cr~0.9.  Takes Lasix  20 mg twice daily at home.  Appears euvolemic on exam.  Creatinine trended up after stopping IV fluid again   Hyponatremia: Baseline~128. Na down to 125 today likely due to Remeron  which was resumed yesterday   Chronic COPD/chronic hypoxic RF: Reportedly stopped using home oxygen  about a week prior to coming to ED. Currently saturating in upper 90s on 2 L.   MDS/CMML/pancytopenia:  Now with leukocytosis (monocytosis)., anemia and thrombocytopenia.  No report of overt bleeding.  She was transfused 1 unit on 2/2 and 2/5.  Hgb improved.  Likely due to underlying CMML.   Chronic diastolic CHF: TTE 07/2023 with LVEF of 60 to 65%, moderate LVH and G2-DD,  severe LAE, mild to moderate MR and moderate to severe AS.  Patient denies cardiopulmonary symptoms but not a great historian.  She is on Lasix  at home.  Appears euvolemic on exam.   Non-anion gap metabolic acidosis: Likely due to AKI.  Resolved.   History of stage I breast cancer s/p  lumpectomy and XRT in 1999 -Tamoxifen   History of stage I left upper lobe lung cancer:  s/p left upper lobe lobectomy in 2006   Hypothyroidism -On Synthroid    Hypomagnesemia/hypophosphatemia -Monitor replenish as appropriate   Hyperkalemia: Resolved.   Physical deconditioning -PT/OT-recommended SNF.  TOC working on this.   Leukocytosis with monocytosis: Discussed with patient's hematologist, Dr. Rogers on 2/5.  Felt to be leukemoid reaction.  He recommended monitoring until WBC starts to trend down.  -Continue monitoring   Dysphagia -Dysphagia 3 diet per SLP        Procedures: ACLS  Consultations: Pulmonology and palliative medicine  The results of significant diagnostics from this hospitalization (including imaging, microbiology, ancillary and laboratory) are listed below for reference.   Significant Diagnostic Studies: DG CHEST PORT 1 VIEW Result Date: 12/05/2023 CLINICAL DATA:  Aspiration. EXAM: PORTABLE CHEST 1 VIEW COMPARISON:  12/03/2023 and CT chest 08/01/2023. FINDINGS: Patient is rotated. Heart size stable. Left basilar airspace consolidation and probable small left pleural effusion. Mild interstitial prominence and indistinctness with right basilar peripheral septal lines. Probable trace right pleural effusion. IMPRESSION: 1. Suspect mild congestive heart failure. 2. Left basilar collapse/consolidation, possibly due in part to scarring. Difficult to exclude pneumonia. Electronically Signed   By: Newell Eke M.D.   On: 12/05/2023 14:35   CT Head Wo Contrast Result Date: 12/03/2023 CLINICAL DATA:  Mental status change, unknown cause. Altered mental status. EXAM: CT HEAD WITHOUT  CONTRAST TECHNIQUE: Contiguous axial images were obtained from the base of the skull through the vertex without intravenous contrast. RADIATION DOSE REDUCTION: This exam was performed according to the departmental dose-optimization program which includes automated exposure control, adjustment of the mA and/or kV according to patient size and/or use of iterative reconstruction technique. COMPARISON:  Head CT 05/20/2023 and MRI 02/10/2022 FINDINGS: Brain: There is no evidence of an acute infarct, intracranial hemorrhage, mass, midline shift, or extra-axial fluid collection. Generalized cerebral atrophy is mild for age. Confluent hypodensities in the cerebral white matter are stable to mildly progressive and nonspecific but compatible with severe chronic small vessel ischemic disease. Vascular: Calcified atherosclerosis at the skull base. No hyperdense vessel. Skull: No acute fracture or suspicious osseous lesion. Sinuses/Orbits: Mild mucosal thickening and small mucous retention cyst in the left maxillary sinus. No significant mastoid fluid. Bilateral cataract extraction. Other: None. IMPRESSION: 1. No evidence of acute intracranial abnormality. 2. Severe chronic small vessel ischemic disease. Electronically Signed   By: Dasie Hamburg M.D.   On: 12/03/2023 14:08   DG Chest Port 1 View Result Date: 12/03/2023 CLINICAL DATA:  Altered mental status. EXAM: PORTABLE CHEST 1 VIEW COMPARISON:  X-ray 08/11/2023. FINDINGS: Enlarged cardiopericardial silhouette. Calcified tortuous aorta. Interstitial changes are again seen but improved from previous. Surgical changes medial left lung apex. Tiny pleural effusions. Overlapping cardiac leads. Degenerative changes of the spine. IMPRESSION: Enlarged heart. Small prominent central vasculature with  some mild interstitial changes but less than prior. Question tiny effusions. Postsurgical changes left lung Electronically Signed   By: Ranell Bring M.D.   On: 12/03/2023 12:22     Microbiology: Recent Results (from the past 240 hours)  Culture, blood (routine x 2)     Status: None   Collection Time: 12/03/23  1:25 PM   Specimen: BLOOD  Result Value Ref Range Status   Specimen Description BLOOD RIGHT ANTECUBITAL  Final   Special Requests   Final    BOTTLES DRAWN AEROBIC AND ANAEROBIC Blood Culture results may not be optimal due to an inadequate volume of blood received in culture bottles   Culture   Final    NO GROWTH 5 DAYS Performed at [facility], [address] [place], [place] [address]    Report Status 12/08/2023 FINAL  Final  Culture, blood (routine x 2)     Status: None   Collection Time: 12/03/23  1:25 PM   Specimen: BLOOD  Result Value Ref Range Status   Specimen Description BLOOD RIGHT ANTECUBITAL  Final   Special Requests   Final    AEROBIC BOTTLE ONLY Blood Culture results may not be optimal due to an inadequate volume of blood received in culture bottles   Culture   Final    NO GROWTH 5 DAYS Performed at [facility], [address] [place], [place] [address]    Report Status 12/08/2023 FINAL  Final  Urine Culture (for pregnant, neutropenic or urologic patients or patients with an indwelling urinary catheter)     Status: Abnormal   Collection Time: 12/03/23  6:08 PM   Specimen: Urine, Clean Catch  Result Value Ref Range Status   Specimen Description URINE, CLEAN CATCH  Final   Special Requests   Final    NONE Performed at [facility], [address] [place], [place] [address]    Culture (A)  Final    60,000 COLONIES/mL KLEBSIELLA PNEUMONIAE 10,000 COLONIES/mL YEAST    Report Status 12/06/2023 FINAL  Final   Organism ID, Bacteria KLEBSIELLA PNEUMONIAE (A)  Final      Susceptibility   Klebsiella pneumoniae - MIC*    AMPICILLIN RESISTANT Resistant     CEFAZOLIN <=4 SENSITIVE Sensitive     CEFEPIME  <=0.12 SENSITIVE Sensitive     CEFTRIAXONE  <=0.25 SENSITIVE Sensitive     CIPROFLOXACIN <=0.25 SENSITIVE  Sensitive     GENTAMICIN <=1 SENSITIVE Sensitive     IMIPENEM <=0.25 SENSITIVE Sensitive     NITROFURANTOIN 64 INTERMEDIATE Intermediate     TRIMETH /SULFA  <=20 SENSITIVE Sensitive     AMPICILLIN/SULBACTAM <=2 SENSITIVE Sensitive     PIP/TAZO <=4 SENSITIVE Sensitive ug/mL    * 60,000 COLONIES/mL KLEBSIELLA PNEUMONIAE  MRSA Next Gen by PCR, Nasal     Status: None   Collection Time: 12/05/23  6:06 AM   Specimen: Nasal Mucosa; Nasal Swab  Result Value Ref Range Status   MRSA by PCR Next Gen NOT DETECTED NOT DETECTED Final    Comment: (NOTE) The GeneXpert MRSA Assay (FDA approved for NASAL specimens only), is one component of a comprehensive MRSA colonization surveillance program. It is not intended to diagnose MRSA infection nor to guide or monitor treatment for MRSA infections. Test performance is not FDA approved in patients less than 38 years old. Performed at [facility], [address], [place], [place] [address]     Time spent: 35 minutes  Signed: Mignon ONEIDA Bump, MD  12/31/2023   

## 2024-01-01 NOTE — Plan of Care (Signed)

## 2024-01-01 NOTE — Progress Notes (Signed)
   12/15/2023 1533  Spiritual Encounters  Type of Visit Initial  Care provided to: Patient  Conversation partners present during encounter Nurse  Referral source Code page  Reason for visit Code   Chaplain responded to both Code Blues, on on 2W18 and then again with the same Pt on 3M11. There was no family present at either rooms.  Pt expired.  Chaplain Maude Roll, MDiv [email or web address] [email or web address] [phone]

## 2024-01-01 NOTE — Progress Notes (Signed)
 Mobility Specialist: Progress Note   December 12, 2023 1100  Mobility  Activity Transferred from bed to chair  Level of Assistance +2 (takes two people)  Assistive Device Stedy  Activity Response Tolerated well  Mobility Referral Yes  Mobility visit 1 Mobility  Mobility Specialist Start Time (ACUTE ONLY) 1047  Mobility Specialist Stop Time (ACUTE ONLY) 1103  Mobility Specialist Time Calculation (min) (ACUTE ONLY) 16 min    Pt was agreeable to mobility session - received in bed. MaxA+2 for bed mobility, modA+2 for transfer via stedy. C/o BLE pain, L>R. Left in chair with all needs met, call bell in reach.   Ileana Lute Mobility Specialist Please contact via SecureChat or Rehab office at [phone]

## 2024-01-01 NOTE — Progress Notes (Signed)
 PROGRESS NOTE  Lisa [name] FMW:[id number] DOB: February 20, 1934   PCP: Shona Norleen PEDLAR, MD  Patient is from: Home.  Lives with daughter.  DOA: 12/03/2023 LOS: 6  Chief complaints Chief Complaint  Patient presents with   Altered Mental Status     Brief Narrative / Interim history: 89 year old F with PMH of dementia, COPD, chronic hypoxic RF, MDS, pancytopenia, paroxysmal A-fib, diastolic CHF, AAA, chronic hyponatremia, left lung cancer, left breast cancer s/p lumpectomy, CKD-3A, recent UTI and moderate aortic stenosis brought to ED due to altered mental status, decreased appetite/p.o. intake.  Reportedly not wearing oxygen  for about a week.  She was admitted with working diagnosis of acute metabolic encephalopathy, AKI, kalemia, hyponatremia urinary tract infection.  Vitals stable.  K6.3.Cr 1.67 (baseline 1.0).  Protein 9.4.  Albumin  3.2.  Na 129 (baseline).  Hgb 8.2.  UA concerning for UTI.  VBG reassuring.  CT head without acute finding.  Cultures obtained.  Patient was started on IV ceftriaxone , IV fluid and electrolyte replenishment.  Patient had persistent hypotension concerning for septic shock.  He was transferred to ICU on 2/2 and started on vasopressors.  Blood cultures NGTD.  Urine culture with 60,000 colonies of Klebsiella pneumonia and 10,000 colonies of yeast.  Eventually, she came off vasopressor.  She was transferred to regular floor on 2/3, and TRH assumed care on 2/4.  Palliative medicine consulted.  Remains full code with full scope of care.  Family interested in CIR.  Subjective: Seen and examined earlier this morning.  No major events overnight of this morning.  No complaints but somewhat sleepy on bedside chair.  She wakes to voice.  Fairly oriented but limited by impaired hearing.  Objective: Vitals:   01/07/24 0500 2024/01/07 0601 2024-01-07 0758 01-07-24 0845  BP: 112/62  104/65   Pulse: 95  87   Resp:  18 20   Temp: 98.2 F (36.8 C)  98 F (36.7 C)   TempSrc: Oral   Oral   SpO2: 96%  100% 99%  Weight:  65.2 kg    Height:        Examination:  GENERAL: No apparent distress.  Nontoxic. HEENT: MMM.  Vision grossly intact.  Diminished hearing. NECK: Supple.  No apparent JVD.  RESP:  No IWOB.  Fair aeration bilaterally. CVS: Tachycardic to 110s.  Irregular rhythm.  Heart sounds normal.  ABD/GI/GU: BS+. Abd soft, NTND.  MSK/EXT:  Moves extremities. No apparent deformity. No edema.  SKIN: no apparent skin lesion or wound NEURO: Sleepy but wakes to voice.  Fairly oriented.  Follows commands.  Limited insight.  Motor 2/5 in BLE.  Otherwise no focal neurodeficit.  PSYCH: Calm. Normal affect.   Procedures:  None  Microbiology summarized: Blood cultures NGTD Urine culture with 60,000 colonies Klebsiella pneumonia and 10,000 colonies of yeast  Assessment and plan: Acute metabolic encephalopathy: Multifactorial including underlying dementia, urinary tract infection, hyponatremia, dehydration and polypharmacy.  Her UA and urine culture concerning for UTI.  VBG is reassuring.  CT head without acute finding.  She is on Zyprexa , trazodone  and Remeron  which could put her at risk of polypharmacy.  She has no focal neurodeficit other than symmetric BLE weakness.  Encephalopathy resolved but she is sleepy again today.  We resumed her Remeron  at a lower dose after talking to her grandson yesterday. -Completed antibiotic course of ceftriaxone . -Reorientation and delirium precautions. -Resumed low-dose Remeron , changed trazodone  to as needed and discontinued Seroquel  after talking with patient's grandson, Leanor who is HCPOA on  2/6.  Continuing Zyprexa  -Avoid benzodiazepines -PT/OT  Paroxysmal atrial fibrillation with mild RVR: RVR resolved after resuming metoprolol .   -Continue metoprolol  12.5 mg twice daily -Optimize electrolytes.   Urinary tract infection: Presents with altered mental status, poor p.o. intake and dehydration.  UA consistent with UTI.  Urine  culture as above.  Completed ceftriaxone  from 1/31-2/5.  AKI on CKD-3A: Likely due to dehydration in the setting of poor p.o. intake.  B/l Cr~0.9.  Takes Lasix  20 mg twice daily at home.  Appears euvolemic on exam.  Creatinine trended up after stopping IV fluid again Recent Labs    12/03/23 2046 12/04/23 0757 12/05/23 0321 12/05/23 0323 12/06/23 0323 12/06/23 1914 12/07/23 0602 12/08/23 0710 12/09/23 0723 12/15/23 0701  BUN 33* 27* 25* 25* 23 20 24* 25* 22 28*  CREATININE 1.63* 1.38* 1.27* 1.27* 0.96 1.03* 1.59* 1.23* 0.89 1.49*  -NS bolus 250 cc x 2. -Strict intake and output -Recheck in the morning -Avoid nephrotoxic meds.  Hyponatremia: Baseline~128. Na down to 125 today -IV NS bolus as above  Chronic COPD/chronic hypoxic RF: Reportedly stopped using home oxygen  about a week prior to coming to ED. Currently saturating in upper 90s on 2 L. -Incentive spirometry, wean oxygen  as able and assess ambulatory saturation -Continue LABA/ICS  MDS/CMML/pancytopenia: Now with leukocytosis (monocytosis)., anemia and thrombocytopenia.  No report of overt bleeding.  She was transfused 1 unit on 2/2 and 2/5.  Hgb improved.  Likely due to underlying CMML. Recent Labs    12/03/23 1757 12/04/23 0757 12/05/23 0325 12/05/23 1428 12/06/23 0323 12/07/23 0602 12/08/23 0710 12/08/23 1805 12/09/23 0723 Dec 15, 2023 0701  HGB 8.8* 7.3* 6.8* 7.9* 8.5* 7.6* 7.2* 8.8* 8.1* 8.0*  -Monitor H&H -SCD for VTE prophylaxis -Discussed with patient's hematologist, Dr. Katragadda (see below)  Chronic diastolic CHF: TTE 07/2023 with LVEF of 60 to 65%, moderate LVH and G2-DD, severe LAE, mild to moderate MR and moderate to severe AS.  Patient denies cardiopulmonary symptoms but not a great historian.  She is on Lasix  at home.  Appears euvolemic on exam. -Continue holding diuretics. -Strict intake and output, daily weight, renal functions and electrolytes   Non-anion gap metabolic acidosis: Likely due to AKI.   Resolved.  History of stage I breast cancer s/p  lumpectomy and XRT in 1999 -Tamoxifen  History of stage I left upper lobe lung cancer:  s/p left upper lobe lobectomy in 2006 -Outpatient follow-up.   Hypothyroidism -On Synthroid   Hypomagnesemia/hypophosphatemia -Monitor replenish as appropriate  Hyperkalemia: Resolved.  Physical deconditioning -PT/OT-recommended SNF.  TOC working on this.  Leukocytosis with monocytosis: Discussed with patient's hematologist, Dr. Rogers on 2/5.  Felt to be leukemoid reaction.  He recommended monitoring until WBC starts to trend down.  -Continue monitoring  Dysphagia -Dysphagia 3 diet per SLP  Body mass index is 22.51 kg/m.  DVT prophylaxis Place and maintain sequential compression device Start: 12/08/23 1029 Place and maintain sequential compression device Start: 12/04/23 1556 SCDs Start: 12/03/23 1805  Code Status: Full code Family Communication: Updated patient's grandson, Leanor over the phone on 2/6.  None at bedside today Level of care: Telemetry Medical Status is: Inpatient Remains inpatient appropriate because: AKI, hyponatremia and CIR?   Final disposition: CIR Consultants:  Pulmonology Palliative medicine  55 minutes with more than 50% spent in reviewing records, counseling patient/family and coordinating care.   Sch Meds:  Scheduled Meds:  Chlorhexidine  Gluconate Cloth  6 each Topical Daily   estradiol   1 Applicatorful Vaginal QHS   feeding supplement (  NEPRO CARB STEADY)  237 mL Oral BID BM   fluticasone  furoate-vilanterol  1 puff Inhalation Daily   And   umeclidinium bromide   1 puff Inhalation Daily   metoprolol  tartrate  12.5 mg Oral BID   mirtazapine   7.5 mg Oral QHS   multivitamin with minerals  1 tablet Oral Daily   OLANZapine   5 mg Oral QHS   pantoprazole   40 mg Oral Q0600   pravastatin   40 mg Oral QPM   sodium chloride  flush  3 mL Intravenous Q12H   Continuous Infusions:  sodium chloride  250 mL  (2023/12/11 1330)   PRN Meds:.acetaminophen , ondansetron  (ZOFRAN ) IV, [COMPLETED] polyethylene glycol **FOLLOWED BY** polyethylene glycol, [COMPLETED] senna-docusate **FOLLOWED BY** senna-docusate, traZODone   Antimicrobials: Anti-infectives (From admission, onward)    Start     Dose/Rate Route Frequency Ordered Stop   12/04/23 1900  cefTRIAXone  (ROCEPHIN ) 2 g in sodium chloride  0.9 % 100 mL IVPB  Status:  Discontinued        2 g 200 mL/hr over 30 Minutes Intravenous Every 24 hours 12/04/23 0839 12/08/23 0756   12/03/23 1900  cefTRIAXone  (ROCEPHIN ) 1 g in sodium chloride  0.9 % 100 mL IVPB  Status:  Discontinued        1 g 200 mL/hr over 30 Minutes Intravenous Every 24 hours 12/03/23 1820 12/04/23 0839        I have personally reviewed the following labs and images: CBC: Recent Labs  Lab 12/06/23 0323 12/07/23 0602 12/08/23 0710 12/08/23 1805 12/09/23 0723 Dec 11, 2023 0701  WBC 10.2 21.1* 21.4*  --  17.7* 23.6*  NEUTROABS  --  0.4*  --   --  0.4*  --   HGB 8.5* 7.6* 7.2* 8.8* 8.1* 8.0*  HCT 28.1* 25.5* 24.0* 29.0* 25.2* 25.5*  MCV 98.3 98.8 97.6  --  93.7 94.4  PLT 34* 45* 48*  --  51* 53*   BMP &GFR Recent Labs  Lab 12/04/23 0758 12/04/23 1623 12/05/23 0321 12/05/23 0323 12/06/23 0323 12/06/23 1914 12/07/23 0602 12/08/23 0710 12/09/23 0723 12/11/23 0701  NA  --   --  125* 125*   < > 128* 128* 127* 130* 125*  K  --    < > 5.2* 5.3*   < > 3.7 4.2 4.4 4.1 5.1  CL  --   --  98 99   < > 95* 98 94* 96* 91*  CO2  --   --  19* 18*   < > 22 18* 20* 25 25  GLUCOSE  --   --  126* 124*   < > 117* 99 80 96 88  BUN  --   --  25* 25*   < > 20 24* 25* 22 28*  CREATININE  --   --  1.27* 1.27*   < > 1.03* 1.59* 1.23* 0.89 1.49*  CALCIUM   --   --  8.3* 8.3*   < > 8.3* 8.3* 7.8* 7.2* 7.3*  MG 1.6*  --  2.0  --   --   --   --  1.8 1.6* 2.2  PHOS  --   --   --  4.0  --   --   --  1.7* 2.1* 2.0*   < > = values in this interval not displayed.   Estimated Creatinine Clearance: 25.9  mL/min (A) (by C-G formula based on SCr of 1.49 mg/dL (H)). Liver & Pancreas: Recent Labs  Lab 12/03/23 2046 12/04/23 9242 12/05/23 9676 12/07/23 0602 12/08/23 0710 12/09/23 0723 12-11-2023  0701  AST 39 32  --  82*  --   --   --   ALT 11 10  --  20  --   --   --   ALKPHOS 39 34*  --  38  --   --   --   BILITOT 1.2 1.3*  --  1.1  --   --   --   PROT 9.3* 8.0  --  8.3*  --   --   --   ALBUMIN  3.1* 2.7* 2.7* 2.9* 2.6* 2.3* 2.3*   No results for input(s): LIPASE, AMYLASE in the last 168 hours. No results for input(s): AMMONIA in the last 168 hours. Diabetic: No results for input(s): HGBA1C in the last 72 hours. Recent Labs  Lab 12/05/23 0620  GLUCAP 124*   Cardiac Enzymes: No results for input(s): CKTOTAL, CKMB, CKMBINDEX, TROPONINI in the last 168 hours. No results for input(s): PROBNP in the last 8760 hours. Coagulation Profile: No results for input(s): INR, PROTIME in the last 168 hours. Thyroid  Function Tests: No results for input(s): TSH, T4TOTAL, FREET4, T3FREE, THYROIDAB in the last 72 hours. Lipid Profile: No results for input(s): CHOL, HDL, LDLCALC, TRIG, CHOLHDL, LDLDIRECT in the last 72 hours. Anemia Panel: Recent Labs    12/08/23 0710  VITAMINB12 720  FOLATE 36.1  FERRITIN 1,465*  TIBC 171*  IRON 36  RETICCTPCT 4.6*   Urine analysis:    Component Value Date/Time   COLORURINE YELLOW 12/03/2023 1716   APPEARANCEUR HAZY (A) 12/03/2023 1716   APPEARANCEUR Clear 12/01/2023 0954   LABSPEC 1.011 12/03/2023 1716   PHURINE 5.0 12/03/2023 1716   GLUCOSEU NEGATIVE 12/03/2023 1716   HGBUR MODERATE (A) 12/03/2023 1716   BILIRUBINUR NEGATIVE 12/03/2023 1716   BILIRUBINUR Negative 12/01/2023 0954   KETONESUR NEGATIVE 12/03/2023 1716   PROTEINUR 100 (A) 12/03/2023 1716   NITRITE NEGATIVE 12/03/2023 1716   LEUKOCYTESUR LARGE (A) 12/03/2023 1716   Sepsis Labs: Invalid input(s): PROCALCITONIN,  LACTICIDVEN  Microbiology: Recent Results (from the past 240 hours)  Microscopic Examination     Status: None   Collection Time: 12/01/23  9:54 AM   Urine  Result Value Ref Range Status   WBC, UA 0-5 0 - 5 /hpf Final   RBC, Urine 0-2 0 - 2 /hpf Final   Epithelial Cells (non renal) 0-10 0 - 10 /hpf Final   Bacteria, UA None seen None seen/Few Final  Culture, blood (routine x 2)     Status: None   Collection Time: 12/03/23  1:25 PM   Specimen: BLOOD  Result Value Ref Range Status   Specimen Description BLOOD RIGHT ANTECUBITAL  Final   Special Requests   Final    BOTTLES DRAWN AEROBIC AND ANAEROBIC Blood Culture results may not be optimal due to an inadequate volume of blood received in culture bottles   Culture   Final    NO GROWTH 5 DAYS Performed at [facility], [address] [place], [place] [address]    Report Status 12/08/2023 FINAL  Final  Culture, blood (routine x 2)     Status: None   Collection Time: 12/03/23  1:25 PM   Specimen: BLOOD  Result Value Ref Range Status   Specimen Description BLOOD RIGHT ANTECUBITAL  Final   Special Requests   Final    AEROBIC BOTTLE ONLY Blood Culture results may not be optimal due to an inadequate volume of blood received in culture bottles   Culture   Final  NO GROWTH 5 DAYS Performed at [facility], [address] [place], [place] [address]    Report Status 12/08/2023 FINAL  Final  Urine Culture (for pregnant, neutropenic or urologic patients or patients with an indwelling urinary catheter)     Status: Abnormal   Collection Time: 12/03/23  6:08 PM   Specimen: Urine, Clean Catch  Result Value Ref Range Status   Specimen Description URINE, CLEAN CATCH  Final   Special Requests   Final    NONE Performed at [facility], [address], [place], [place] [address]    Culture (A)  Final    60,000 COLONIES/mL KLEBSIELLA PNEUMONIAE 10,000 COLONIES/mL YEAST    Report Status 12/06/2023 FINAL  Final   Organism  ID, Bacteria KLEBSIELLA PNEUMONIAE (A)  Final      Susceptibility   Klebsiella pneumoniae - MIC*    AMPICILLIN RESISTANT Resistant     CEFAZOLIN <=4 SENSITIVE Sensitive     CEFEPIME  <=0.12 SENSITIVE Sensitive     CEFTRIAXONE  <=0.25 SENSITIVE Sensitive     CIPROFLOXACIN <=0.25 SENSITIVE Sensitive     GENTAMICIN <=1 SENSITIVE Sensitive     IMIPENEM <=0.25 SENSITIVE Sensitive     NITROFURANTOIN 64 INTERMEDIATE Intermediate     TRIMETH /SULFA  <=20 SENSITIVE Sensitive     AMPICILLIN/SULBACTAM <=2 SENSITIVE Sensitive     PIP/TAZO <=4 SENSITIVE Sensitive ug/mL    * 60,000 COLONIES/mL KLEBSIELLA PNEUMONIAE  MRSA Next Gen by PCR, Nasal     Status: None   Collection Time: 12/05/23  6:06 AM   Specimen: Nasal Mucosa; Nasal Swab  Result Value Ref Range Status   MRSA by PCR Next Gen NOT DETECTED NOT DETECTED Final    Comment: (NOTE) The GeneXpert MRSA Assay (FDA approved for NASAL specimens only), is one component of a comprehensive MRSA colonization surveillance program. It is not intended to diagnose MRSA infection nor to guide or monitor treatment for MRSA infections. Test performance is not FDA approved in patients less than 1 years old. Performed at [facility], [address] [place], [place] [address]     Radiology Studies: No results found.    Anarie Kalish T. Jaidyn Usery [facility]  If 7PM-7AM, please contact night-coverage [email or web address] 12-13-2023, 1:47 PM

## 2024-01-01 NOTE — Progress Notes (Signed)
 RT reported to CODE BLUE in 2W18.  ROSC was achieved at 1440.  Patient transported to 3M11 being manually ventilated.

## 2024-01-01 NOTE — Code Documentation (Signed)
  Patient Name: Lisa [name]   MRN: [id number]   Date of Birth/ Sex: [birth year] , female      Admission Date: 12/03/2023  Attending Provider: Kathrin Mignon DASEN, MD  Primary Diagnosis: Acute encephalopathy   Indication: Pt was in her usual state of health until this PM, when she was noted to be bradycardic, became unresponsive and without a pulse. Code blue was subsequently called. At the time of arrival on scene, ACLS protocol was underway.   Technical Description:  - CPR performance duration:   *-  when arrived at 30M  became pulseless, and received 2 mins of CPR. 7 minutes   -  Was defibrillation or cardioversion used? Yes   - Was external pacer placed? No  - Was patient intubated pre/post CPR? Yes   Medications Administered: Y = Yes; Blank = No Amiodarone     Atropine    Calcium   Y  Epinephrine   Y  Lidocaine     Magnesium   Y  Norepinephrine     Phenylephrine     Sodium bicarbonate   Y  Vasopressin     Post CPR evaluation:  - Final Status - Was patient successfully resuscitated ? Yes - What is current rhythm? Sinus Tachycardia - What is current hemodynamic status? Critical  Miscellaneous Information:  - Labs sent, including: ABG, CMP  - Primary team notified?  Yes  - Family Notified? Yes  - Additional notes/ transfer status: Transfer to 30M, signed out to [facility], MD  Dec 23, 2023, 3:34 PM

## 2024-01-01 NOTE — Progress Notes (Addendum)
 Tele called primary nurse saying patient heart rate is 29 and has a pause. When Primary nurse walked  in to the patient room patient was unresponsive, agonal breathing and very weak thready pulse. Charge nurse notified and CODE BLUE called. CPR started.  CODE BLUE team at the bedside.

## 2024-01-01 DEATH — deceased

## 2024-01-04 MED FILL — Medication: Qty: 1 | Status: AC

## 2024-01-10 ENCOUNTER — Inpatient Hospital Stay: Payer: Medicare Other

## 2024-01-10 ENCOUNTER — Inpatient Hospital Stay: Payer: Medicare Other | Admitting: Hematology

## 2024-01-13 ENCOUNTER — Ambulatory Visit: Payer: Medicare Other | Admitting: Urology

## 2024-04-28 ENCOUNTER — Ambulatory Visit: Payer: Medicare Other | Admitting: Internal Medicine
